# Patient Record
Sex: Male | Born: 1970 | Race: Black or African American | Hispanic: No | Smoking: Former smoker
Health system: Southern US, Community
[De-identification: ages and names within clinical notes are randomized; demographics above are authoritative.]

## PROBLEM LIST (undated history)

## (undated) DIAGNOSIS — Z95811 Presence of heart assist device: Secondary | ICD-10-CM

## (undated) DIAGNOSIS — J45909 Unspecified asthma, uncomplicated: Secondary | ICD-10-CM

## (undated) DIAGNOSIS — Z9989 Dependence on other enabling machines and devices: Principal | ICD-10-CM

## (undated) DIAGNOSIS — K219 Gastro-esophageal reflux disease without esophagitis: Secondary | ICD-10-CM

## (undated) DIAGNOSIS — G4733 Obstructive sleep apnea (adult) (pediatric): Principal | ICD-10-CM

## (undated) DIAGNOSIS — Z8719 Personal history of other diseases of the digestive system: Secondary | ICD-10-CM

## (undated) DIAGNOSIS — I509 Heart failure, unspecified: Secondary | ICD-10-CM

## (undated) HISTORY — DX: Dependence on other enabling machines and devices: Z99.89

## (undated) HISTORY — DX: Obstructive sleep apnea (adult) (pediatric): G47.33

---
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
## 2002-05-01 ENCOUNTER — Encounter: Payer: Self-pay | Admitting: *Deleted

## 2002-05-01 ENCOUNTER — Emergency Department (HOSPITAL_COMMUNITY): Admission: EM | Admit: 2002-05-01 | Discharge: 2002-05-01 | Payer: Self-pay | Admitting: *Deleted

## 2002-12-06 ENCOUNTER — Emergency Department (HOSPITAL_COMMUNITY): Admission: EM | Admit: 2002-12-06 | Discharge: 2002-12-06 | Payer: Self-pay | Admitting: Emergency Medicine

## 2002-12-06 ENCOUNTER — Encounter: Payer: Self-pay | Admitting: Emergency Medicine

## 2003-04-15 ENCOUNTER — Emergency Department (HOSPITAL_COMMUNITY): Admission: EM | Admit: 2003-04-15 | Discharge: 2003-04-15 | Payer: Self-pay | Admitting: Emergency Medicine

## 2004-06-12 ENCOUNTER — Emergency Department (HOSPITAL_COMMUNITY): Admission: EM | Admit: 2004-06-12 | Discharge: 2004-06-12 | Payer: Self-pay | Admitting: Emergency Medicine

## 2006-07-22 ENCOUNTER — Emergency Department (HOSPITAL_COMMUNITY): Admission: EM | Admit: 2006-07-22 | Discharge: 2006-07-22 | Payer: Self-pay | Admitting: Emergency Medicine

## 2006-08-22 ENCOUNTER — Emergency Department (HOSPITAL_COMMUNITY): Admission: EM | Admit: 2006-08-22 | Discharge: 2006-08-22 | Payer: Self-pay | Admitting: Emergency Medicine

## 2007-01-01 ENCOUNTER — Emergency Department (HOSPITAL_COMMUNITY): Admission: EM | Admit: 2007-01-01 | Discharge: 2007-01-01 | Payer: Self-pay | Admitting: Emergency Medicine

## 2007-11-08 ENCOUNTER — Emergency Department (HOSPITAL_COMMUNITY): Admission: EM | Admit: 2007-11-08 | Discharge: 2007-11-08 | Payer: Self-pay | Admitting: Emergency Medicine

## 2008-04-12 ENCOUNTER — Emergency Department (HOSPITAL_COMMUNITY): Admission: EM | Admit: 2008-04-12 | Discharge: 2008-04-12 | Payer: Self-pay | Admitting: Emergency Medicine

## 2011-02-26 LAB — CULTURE, ROUTINE-ABSCESS
Culture: NO GROWTH
Gram Stain: NONE SEEN

## 2016-04-30 ENCOUNTER — Emergency Department (HOSPITAL_COMMUNITY)
Admission: EM | Admit: 2016-04-30 | Discharge: 2016-05-01 | Disposition: A | Discharge status: Home / Self Care | Payer: Self-pay | Attending: Emergency Medicine | Admitting: Emergency Medicine

## 2016-04-30 DIAGNOSIS — B372 Candidiasis of skin and nail: Secondary | ICD-10-CM | POA: Insufficient documentation

## 2016-04-30 DIAGNOSIS — B3789 Other sites of candidiasis: Secondary | ICD-10-CM

## 2016-04-30 LAB — CBG MONITORING, ED: Glucose-Capillary: 95 mg/dL (ref 65–99)

## 2016-04-30 MED ORDER — NYSTATIN 100000 UNIT/GM EX CREA: TOPICAL_CREAM | CUTANEOUS | 0 refills | Status: DC

## 2016-04-30 NOTE — Discharge Instructions (Addendum)
Your blood sugar was normal today. I recommend you establish care with a primary care provider.

## 2016-04-30 NOTE — ED Triage Notes (Signed)
Pt reports moist rash in groin area x couple of months, states it is itching.

## 2016-04-30 NOTE — ED Provider Notes (Signed)
TIME SEEN: 11:40 PM  CHIEF COMPLAINT: Rash  HPI: Pt is a 45 y.o. male with a significant past medical history who presents to the emergency department with a pruritic rash in his inguinal. The past 2 months. States that he got tired of itching and findings are to be checked out for it. Does not have a primary care provider. Is not a diabetic. His anemia compromised in any way. No fevers, other rash. No systemic symptoms.  ROS: See HPI Constitutional: no fever  Eyes: no drainage  ENT: no runny nose   Cardiovascular:  no chest pain  Resp: no SOB  GI: no vomiting GU: no dysuria Integumentary: no rash  Allergy: no hives  Musculoskeletal: no leg swelling  Neurological: no slurred speech ROS otherwise negative  PAST MEDICAL HISTORY/PAST SURGICAL HISTORY:  No past medical history on file.  MEDICATIONS:  Prior to Admission medications   Medication Sig Start Date End Date Taking? Authorizing Provider  nystatin cream (MYCOSTATIN) Apply to affected area 2 times daily 04/30/16   Layla MawKristen N Copeland Neisen, DO    ALLERGIES:  Allergies not on file  SOCIAL HISTORY:  Social History  Substance Use Topics  . Smoking status: Not on file  . Smokeless tobacco: Not on file  . Alcohol use Not on file    FAMILY HISTORY: No family history on file.  EXAM: BP 119/77 (BP Location: Right Arm)   Pulse 82   Temp 98.2 F (36.8 C) (Oral)   Resp 16   Ht 6\' 2"  (1.88 m)   Wt 205 lb (93 kg)   SpO2 100%   BMI 26.32 kg/m  CONSTITUTIONAL: Alert and oriented and responds appropriately to questions. Well-appearing; well-nourished HEAD: Normocephalic EYES: Conjunctivae clear, PERRL, EOMI ENT: normal nose; no rhinorrhea; moist mucous membranes NECK: Supple, no meningismus, no nuchal rigidity, no LAD  CARD: RRR; S1 and S2 appreciated; no murmurs, no clicks, no rubs, no gallops RESP: Normal chest excursion without splinting or tachypnea; breath sounds clear and equal bilaterally; no wheezes, no rhonchi, no rales,  no hypoxia or respiratory distress, speaking full sentences ABD/GI: Normal bowel sounds; non-distended; soft, non-tender, no rebound, no guarding, no peritoneal signs, no hepatosplenomegaly GU:  Patient has a rash consistent with candidiasis in his bilateral inguinal folds - rash is raised, skin is darkened in this area, no significant erythema or warmth, no induration or fluctuance, no drainage, he does have satellite lesions, no petechiae or purpura, no blisters or desquamation BACK:  The back appears normal and is non-tender to palpation, there is no CVA tenderness EXT: Normal ROM in all joints; non-tender to palpation; no edema; normal capillary refill; no cyanosis, no calf tenderness or swelling    SKIN: Normal color for age and race; warm; no rash NEURO: Moves all extremities equally, sensation to light touch intact diffusely, cranial nerves II through XII intact, normal speech PSYCH: The patient's mood and manner are appropriate. Grooming and personal hygiene are appropriate.  MEDICAL DECISION MAKING: Patient here with rash consistent with candidiasis of his inguinal folds. We'll discharge with nystatin cream to place on this area twice daily. Blood glucose is 95. He does not have outpatient provider. Will provide him with this information. Discussed keeping this area clean and dry. No other signs of life-threatening rash on exam. Discussed return precautions.    At this time, I do not feel there is any life-threatening condition present. I have reviewed and discussed all results (EKG, imaging, lab, urine as appropriate) and exam findings with  patient/family. I have reviewed nursing notes and appropriate previous records.  I feel the patient is safe to be discharged home without further emergent workup and can continue workup as an outpatient as needed. Discussed usual and customary return precautions. Patient/family verbalize understanding and are comfortable with this plan.  Outpatient  follow-up has been provided. All questions have been answered.      Layla Maw Makaio Mach, DO 04/30/16 2356

## 2016-10-07 ENCOUNTER — Inpatient Hospital Stay (HOSPITAL_COMMUNITY)
Admission: AD | Admit: 2016-10-07 | Discharge: 2016-10-10 | DRG: 287 | Disposition: A | Discharge status: Home / Self Care | Payer: Medicaid Other | Source: Other Acute Inpatient Hospital | Attending: Cardiovascular Disease | Admitting: Cardiovascular Disease

## 2016-10-07 ENCOUNTER — Encounter (HOSPITAL_COMMUNITY): Payer: Self-pay | Admitting: General Practice

## 2016-10-07 DIAGNOSIS — I509 Heart failure, unspecified: Secondary | ICD-10-CM

## 2016-10-07 DIAGNOSIS — F17210 Nicotine dependence, cigarettes, uncomplicated: Secondary | ICD-10-CM | POA: Diagnosis present

## 2016-10-07 DIAGNOSIS — I5021 Acute systolic (congestive) heart failure: Secondary | ICD-10-CM | POA: Diagnosis present

## 2016-10-07 DIAGNOSIS — I420 Dilated cardiomyopathy: Secondary | ICD-10-CM | POA: Diagnosis present

## 2016-10-07 DIAGNOSIS — I472 Ventricular tachycardia: Secondary | ICD-10-CM | POA: Diagnosis present

## 2016-10-07 DIAGNOSIS — K219 Gastro-esophageal reflux disease without esophagitis: Secondary | ICD-10-CM | POA: Diagnosis present

## 2016-10-07 DIAGNOSIS — I340 Nonrheumatic mitral (valve) insufficiency: Secondary | ICD-10-CM | POA: Diagnosis present

## 2016-10-07 DIAGNOSIS — Z79899 Other long term (current) drug therapy: Secondary | ICD-10-CM

## 2016-10-07 DIAGNOSIS — Z23 Encounter for immunization: Secondary | ICD-10-CM | POA: Diagnosis not present

## 2016-10-07 DIAGNOSIS — I2729 Other secondary pulmonary hypertension: Secondary | ICD-10-CM | POA: Diagnosis present

## 2016-10-07 DIAGNOSIS — J449 Chronic obstructive pulmonary disease, unspecified: Secondary | ICD-10-CM | POA: Diagnosis present

## 2016-10-07 DIAGNOSIS — R011 Cardiac murmur, unspecified: Secondary | ICD-10-CM | POA: Diagnosis present

## 2016-10-07 DIAGNOSIS — I5020 Unspecified systolic (congestive) heart failure: Secondary | ICD-10-CM | POA: Diagnosis present

## 2016-10-07 HISTORY — DX: Gastro-esophageal reflux disease without esophagitis: K21.9

## 2016-10-07 HISTORY — DX: Personal history of other diseases of the digestive system: Z87.19

## 2016-10-07 HISTORY — DX: Unspecified asthma, uncomplicated: J45.909

## 2016-10-07 LAB — TROPONIN I
TROPONIN I: 0.03 ng/mL — AB (ref <0.03)
Troponin I: 0.03 ng/mL (ref <0.03)

## 2016-10-07 LAB — CBC
HCT: 43.2 % (ref 39.0–52.0)
Hemoglobin: 13.6 g/dL (ref 13.0–17.0)
MCH: 29.5 pg (ref 26.0–34.0)
MCHC: 31.5 g/dL (ref 30.0–36.0)
MCV: 93.7 fL (ref 78.0–100.0)
Platelets: 227 10*3/uL (ref 150–400)
RBC: 4.61 MIL/uL (ref 4.22–5.81)
RDW: 13.2 % (ref 11.5–15.5)
WBC: 12.7 10*3/uL — ABNORMAL HIGH (ref 4.0–10.5)

## 2016-10-07 LAB — CREATININE, SERUM
CREATININE: 1.31 mg/dL — AB (ref 0.61–1.24)
GFR calc Af Amer: >60 mL/min (ref >60)

## 2016-10-07 MED ORDER — ONDANSETRON HCL 4 MG/2ML IJ SOLN: 4.0000 mg | Freq: Four times a day (QID) | INTRAMUSCULAR | Status: DC | PRN

## 2016-10-07 MED ORDER — SODIUM CHLORIDE 0.9% FLUSH
3.0000 mL | Freq: Two times a day (BID) | INTRAVENOUS | Status: DC
  Administered 2016-10-07: 3 mL via INTRAVENOUS

## 2016-10-07 MED ORDER — PNEUMOCOCCAL VAC POLYVALENT 25 MCG/0.5ML IJ INJ
0.5000 mL | INJECTION | INTRAMUSCULAR | Status: AC
  Administered 2016-10-10: 0.5 mL via INTRAMUSCULAR
  Filled 2016-10-07: qty 0.5

## 2016-10-07 MED ORDER — CARVEDILOL 3.125 MG PO TABS
3.1250 mg | ORAL_TABLET | Freq: Two times a day (BID) | ORAL | Status: DC
  Administered 2016-10-07 – 2016-10-10 (×5): 3.125 mg via ORAL
  Filled 2016-10-07 (×7): qty 1

## 2016-10-07 MED ORDER — FUROSEMIDE 10 MG/ML IJ SOLN
40.0000 mg | Freq: Two times a day (BID) | INTRAMUSCULAR | Status: DC
  Administered 2016-10-07 – 2016-10-09 (×4): 40 mg via INTRAVENOUS
  Filled 2016-10-07 (×4): qty 4

## 2016-10-07 MED ORDER — HEPARIN SODIUM (PORCINE) 5000 UNIT/ML IJ SOLN
5000.0000 [IU] | Freq: Three times a day (TID) | INTRAMUSCULAR | Status: DC
  Administered 2016-10-07: 5000 [IU] via SUBCUTANEOUS
  Filled 2016-10-07: qty 1

## 2016-10-07 MED ORDER — LOSARTAN POTASSIUM 25 MG PO TABS
25.0000 mg | ORAL_TABLET | Freq: Every day | ORAL | Status: DC
  Administered 2016-10-07 – 2016-10-09 (×2): 25 mg via ORAL
  Filled 2016-10-07 (×3): qty 1

## 2016-10-07 MED ORDER — HEPARIN (PORCINE) IN NACL 100-0.45 UNIT/ML-% IJ SOLN
1250.0000 [IU]/h | INTRAMUSCULAR | Status: DC
  Administered 2016-10-07: 1250 [IU]/h via INTRAVENOUS
  Filled 2016-10-07: qty 250

## 2016-10-07 MED ORDER — CLONAZEPAM 0.5 MG PO TABS
1.0000 mg | ORAL_TABLET | Freq: Every day | ORAL | Status: DC
  Administered 2016-10-07 – 2016-10-09 (×3): 1 mg via ORAL
  Filled 2016-10-07 (×3): qty 2

## 2016-10-07 MED ORDER — SODIUM CHLORIDE 0.9% FLUSH: 3.0000 mL | INTRAVENOUS | Status: DC | PRN

## 2016-10-07 MED ORDER — HEPARIN BOLUS VIA INFUSION
4000.0000 [IU] | Freq: Once | INTRAVENOUS | Status: AC
  Administered 2016-10-07: 4000 [IU] via INTRAVENOUS
  Filled 2016-10-07: qty 4000

## 2016-10-07 MED ORDER — GUAIFENESIN 100 MG/5ML PO SOLN
10.0000 mL | ORAL | Status: DC | PRN
  Administered 2016-10-07 – 2016-10-09 (×3): 200 mg via ORAL
  Filled 2016-10-07: qty 50
  Filled 2016-10-07 (×2): qty 10

## 2016-10-07 MED ORDER — SODIUM CHLORIDE 0.9 % IV SOLN: 250.0000 mL | INTRAVENOUS | Status: DC | PRN

## 2016-10-07 MED ORDER — ACETAMINOPHEN 325 MG PO TABS: 650.0000 mg | ORAL_TABLET | ORAL | Status: DC | PRN

## 2016-10-07 NOTE — Progress Notes (Signed)
ANTICOAGULATION CONSULT NOTE  Pharmacy Consult for heparin Indication: chest pain/ACS  Heparin Dosing Weight: 94.5 kg   Assessment: 45 yom with +troponin. Pharmacy consulted to dose heparin for ACS. Not on anticoagulation PTA. CBC wnl. No bleed documented.   Received heparin SQ 5000 units x 1 at 1653.  Goal of Therapy:  Heparin level 0.3-0.7 units/ml Monitor platelets by anticoagulation protocol: Yes   Plan:  D/c SQ heparin Heparin 4000 unit bolus Start heparin at 1250 units/h 6h heparin level Daily heparin level/CBC Monitor s/sx bleeding   Babs Bertin, PharmD, BCPS Clinical Pharmacist 10/07/2016 9:00 PM

## 2016-10-07 NOTE — H&P (Signed)
Referring Physician: Langley Gauss, MD  Jonathan Wood is an 46 y.o. male.                       Chief Complaint: Shortness of breath on exertion  HPI: 46 year old male with PMH of bronchitis, Asthma, Tobacco use disorder, GERD had viral illness 1 month ago. Shortly after that he felt cough and shortness of breath on exertion. His echocardiogram at [facility] care was positive for dilated LV with reduced EF, mild LVH, diastolic dysfunction, moderate MR and pulmonary systolic hypertension.   Past Medical History:  Diagnosis Date  . Asthma   . GERD (gastroesophageal reflux disease)   . History of hiatal hernia       Past Surgical History:  Procedure Laterality Date  . Harrells    History reviewed. No pertinent family history. Social History:  reports that he has been smoking Cigarettes.  He has a 25.00 pack-year smoking history. He has never used smokeless tobacco. He reports that he drinks about 64 oz of Beer per week . He reports that he does not use drugs.  Allergies: No Known Allergies  Medications Prior to Admission  Medication Sig Dispense Refill  . nystatin cream (MYCOSTATIN) Apply to affected area 2 times daily 30 g 0    Results for orders placed or performed during the hospital encounter of 10/07/16 (from the past 48 hour(s))  CBC     Status: Abnormal   Collection Time: 10/07/16  6:55 PM  Result Value Ref Range   WBC 12.7 (H) 4.0 - 10.5 K/uL   RBC 4.61 4.22 - 5.81 MIL/uL   Hemoglobin 13.6 13.0 - 17.0 g/dL   HCT 43.2 39.0 - 52.0 %   MCV 93.7 78.0 - 100.0 fL   MCH 29.5 26.0 - 34.0 pg   MCHC 31.5 30.0 - 36.0 g/dL   RDW 13.2 11.5 - 15.5 %   Platelets 227 150 - 400 K/uL  Creatinine, serum     Status: Abnormal   Collection Time: 10/07/16  6:55 PM  Result Value Ref Range   Creatinine, Ser 1.31 (H) 0.61 - 1.24 mg/dL   GFR calc non Af Amer >60 >60 mL/min   GFR calc Af Amer >60 >60 mL/min    Comment: (NOTE) The eGFR has been  calculated using the CKD EPI equation. This calculation has not been validated in all clinical situations. eGFR's persistently <60 mL/min signify possible Chronic Kidney Disease.   Troponin I     Status: Abnormal   Collection Time: 10/07/16  6:55 PM  Result Value Ref Range   Troponin I 0.03 (HH) <0.03 ng/mL    Comment: CRITICAL RESULT CALLED TO, READ BACK BY AND VERIFIED WITH: J [facility] [id number] 10/07/2016 WBOND    No results found.  Review Of Systems Constitutional: No fever, chills, weight loss or gain. Eyes: No vision change, wears glasses. No discharge or pain. Ears: No hearing loss, No tinnitus. Respiratory: Positive asthma, COPD, pneumonias. Positive shortness of breath. No hemoptysis. Cardiovascular: No chest pain, palpitation, leg edema. Gastrointestinal: No nausea, vomiting, diarrhea, constipation. No GI bleed. No hepatitis. Genitourinary: No dysuria, hematuria, kidney stone. No incontinance. Neurological: No headache, stroke, seizures.  Psychiatry: No psych facility admission for anxiety, depression, suicide. No detox. Skin: No rash. Musculoskeletal: No joint pain, fibromyalgia. No neck pain, back pain. Lymphadenopathy: No lymphadenopathy. Hematology: No anemia or easy bruising.   Blood pressure (!) 86/65, pulse (!) 103, temperature  98.4 F (36.9 C), temperature source Oral, resp. rate 15, height '6\' 2"'$  (1.88 m), weight 94.5 kg (208 lb 4.8 oz), SpO2 97 %. Body mass index is 26.74 kg/m. General appearance: alert, cooperative, appears stated age and no distress Head: Normocephalic, atraumatic. Eyes: Brown eyes, pink conjunctiva, corneas clear. PERRL, EOM's intact. Neck: No adenopathy, no carotid bruit, no JVD, supple, symmetrical, trachea midline and thyroid not enlarged. Resp: Clearing to auscultation bilaterally. Cardio: Regular rate and rhythm, S1, S2 normal, II/VI systolic murmur, no click, rub or gallop GI: Soft, non-tender; bowel sounds normal; no  organomegaly. Extremities: Trace lower leg edema, no cyanosis or clubbing. Skin: Warm and dry.  Neurologic: Alert and oriented X 3, normal strength. Normal coordination and gait.  Assessment/Plan Acute left heart systolic failure COPD Dilated cardiomyopathy r/o CAD Pulmonary hypertension  R + L heart catheterization. Change lisinopril to Losartan.  Birdie Riddle, MD  10/07/2016, 9:10 PM

## 2016-10-08 ENCOUNTER — Encounter (HOSPITAL_COMMUNITY): Payer: Self-pay | Admitting: Cardiovascular Disease

## 2016-10-08 ENCOUNTER — Encounter (HOSPITAL_COMMUNITY)
Admission: AD | Disposition: A | Discharge status: Home / Self Care | Payer: Self-pay | Source: Other Acute Inpatient Hospital | Attending: Cardiovascular Disease

## 2016-10-08 ENCOUNTER — Inpatient Hospital Stay (HOSPITAL_COMMUNITY): Payer: Medicaid Other

## 2016-10-08 HISTORY — PX: RIGHT/LEFT HEART CATH AND CORONARY ANGIOGRAPHY: CATH118266

## 2016-10-08 LAB — POCT I-STAT 3, ART BLOOD GAS (G3+)
ACID-BASE EXCESS: 4 mmol/L — AB (ref 0.0–2.0)
BICARBONATE: 29.6 mmol/L — AB (ref 20.0–28.0)
O2 Saturation: 98 %
PH ART: 7.398 (ref 7.350–7.450)
PO2 ART: 99 mmHg (ref 83.0–108.0)
TCO2: 31 mmol/L (ref 0–100)
pCO2 arterial: 48 mmHg (ref 32.0–48.0)

## 2016-10-08 LAB — CBC WITH DIFFERENTIAL/PLATELET
BASOS ABS: 0 10*3/uL (ref 0.0–0.1)
Basophils Relative: 0 %
EOS ABS: 0.3 10*3/uL (ref 0.0–0.7)
EOS PCT: 2 %
HCT: 42.9 % (ref 39.0–52.0)
Hemoglobin: 13.7 g/dL (ref 13.0–17.0)
Lymphocytes Relative: 44 %
Lymphs Abs: 5.6 10*3/uL — ABNORMAL HIGH (ref 0.7–4.0)
MCH: 30 pg (ref 26.0–34.0)
MCHC: 31.9 g/dL (ref 30.0–36.0)
MCV: 94.1 fL (ref 78.0–100.0)
MONO ABS: 0.9 10*3/uL (ref 0.1–1.0)
Monocytes Relative: 7 %
Neutro Abs: 5.8 10*3/uL (ref 1.7–7.7)
Neutrophils Relative %: 47 %
PLATELETS: 229 10*3/uL (ref 150–400)
RBC: 4.56 MIL/uL (ref 4.22–5.81)
RDW: 13.2 % (ref 11.5–15.5)
WBC: 12.5 10*3/uL — AB (ref 4.0–10.5)

## 2016-10-08 LAB — COMPREHENSIVE METABOLIC PANEL
ALT: 48 U/L (ref 17–63)
AST: 31 U/L (ref 15–41)
Albumin: 3.2 g/dL — ABNORMAL LOW (ref 3.5–5.0)
Alkaline Phosphatase: 54 U/L (ref 38–126)
Anion gap: 9 (ref 5–15)
BUN: 21 mg/dL — AB (ref 6–20)
CHLORIDE: 102 mmol/L (ref 101–111)
CO2: 26 mmol/L (ref 22–32)
CREATININE: 1.11 mg/dL (ref 0.61–1.24)
Calcium: 8.8 mg/dL — ABNORMAL LOW (ref 8.9–10.3)
GFR calc non Af Amer: >60 mL/min (ref >60)
Glucose, Bld: 105 mg/dL — ABNORMAL HIGH (ref 65–99)
POTASSIUM: 4.6 mmol/L (ref 3.5–5.1)
SODIUM: 137 mmol/L (ref 135–145)
Total Bilirubin: 1 mg/dL (ref 0.3–1.2)
Total Protein: 5.7 g/dL — ABNORMAL LOW (ref 6.5–8.1)

## 2016-10-08 LAB — HEPARIN LEVEL (UNFRACTIONATED): HEPARIN UNFRACTIONATED: 0.4 [IU]/mL (ref 0.30–0.70)

## 2016-10-08 LAB — POCT I-STAT 3, VENOUS BLOOD GAS (G3P V)
Acid-Base Excess: 6 mmol/L — ABNORMAL HIGH (ref 0.0–2.0)
BICARBONATE: 32.6 mmol/L — AB (ref 20.0–28.0)
O2 SAT: 56 %
PCO2 VEN: 52.8 mmHg (ref 44.0–60.0)
PH VEN: 7.398 (ref 7.250–7.430)
TCO2: 34 mmol/L (ref 0–100)
pO2, Ven: 30 mmHg — CL (ref 32.0–45.0)

## 2016-10-08 LAB — PROTIME-INR
INR: 0.97
Prothrombin Time: 12.8 seconds (ref 11.4–15.2)

## 2016-10-08 LAB — BRAIN NATRIURETIC PEPTIDE: B NATRIURETIC PEPTIDE 5: 251.8 pg/mL — AB (ref 0.0–100.0)

## 2016-10-08 LAB — POCT ACTIVATED CLOTTING TIME: Activated Clotting Time: 103 seconds

## 2016-10-08 LAB — HIV ANTIBODY (ROUTINE TESTING W REFLEX): HIV SCREEN 4TH GENERATION: NONREACTIVE

## 2016-10-08 LAB — TROPONIN I: TROPONIN I: 0.04 ng/mL — AB (ref <0.03)

## 2016-10-08 SURGERY — RIGHT/LEFT HEART CATH AND CORONARY ANGIOGRAPHY
Anesthesia: LOCAL

## 2016-10-08 MED ORDER — SODIUM CHLORIDE 0.9% FLUSH
3.0000 mL | Freq: Two times a day (BID) | INTRAVENOUS | Status: DC
  Administered 2016-10-08 – 2016-10-09 (×4): 3 mL via INTRAVENOUS

## 2016-10-08 MED ORDER — IOPAMIDOL (ISOVUE-370) INJECTION 76%
INTRAVENOUS | Status: DC | PRN
  Administered 2016-10-08: 55 mL via INTRA_ARTERIAL

## 2016-10-08 MED ORDER — SODIUM CHLORIDE 0.9% FLUSH: 3.0000 mL | INTRAVENOUS | Status: DC | PRN

## 2016-10-08 MED ORDER — SODIUM CHLORIDE 0.9 % IV SOLN
INTRAVENOUS | Status: DC
  Administered 2016-10-08: 10 mL/h via INTRAVENOUS

## 2016-10-08 MED ORDER — FENTANYL CITRATE (PF) 100 MCG/2ML IJ SOLN
INTRAMUSCULAR | Status: AC
  Filled 2016-10-08: qty 2

## 2016-10-08 MED ORDER — FENTANYL CITRATE (PF) 100 MCG/2ML IJ SOLN
INTRAMUSCULAR | Status: DC | PRN
  Administered 2016-10-08 (×3): 25 ug via INTRAVENOUS

## 2016-10-08 MED ORDER — ALPRAZOLAM 0.25 MG PO TABS
0.2500 mg | ORAL_TABLET | Freq: Three times a day (TID) | ORAL | Status: DC | PRN
Start: 2016-10-08 — End: 2016-10-10
  Administered 2016-10-08: 0.25 mg via ORAL
  Filled 2016-10-08: qty 1

## 2016-10-08 MED ORDER — HEPARIN (PORCINE) IN NACL 2-0.9 UNIT/ML-% IJ SOLN
INTRAMUSCULAR | Status: AC | PRN
  Administered 2016-10-08: 1000 mL via INTRA_ARTERIAL

## 2016-10-08 MED ORDER — HEPARIN (PORCINE) IN NACL 2-0.9 UNIT/ML-% IJ SOLN
INTRAMUSCULAR | Status: AC
  Filled 2016-10-08: qty 500

## 2016-10-08 MED ORDER — MIDAZOLAM HCL 2 MG/2ML IJ SOLN
INTRAMUSCULAR | Status: AC
  Filled 2016-10-08: qty 2

## 2016-10-08 MED ORDER — SODIUM CHLORIDE 0.9 % IV SOLN: 250.0000 mL | INTRAVENOUS | Status: DC | PRN

## 2016-10-08 MED ORDER — IOPAMIDOL (ISOVUE-370) INJECTION 76%
INTRAVENOUS | Status: AC
  Filled 2016-10-08: qty 100

## 2016-10-08 MED ORDER — LIVING BETTER WITH HEART FAILURE BOOK
Freq: Once | Status: AC
  Administered 2016-10-08: 17:00:00

## 2016-10-08 MED ORDER — LIDOCAINE HCL (PF) 1 % IJ SOLN
INTRAMUSCULAR | Status: DC | PRN
  Administered 2016-10-08: 18 mL via INTRADERMAL

## 2016-10-08 MED ORDER — SODIUM CHLORIDE 0.9% FLUSH: 3.0000 mL | Freq: Two times a day (BID) | INTRAVENOUS | Status: DC

## 2016-10-08 MED ORDER — LIDOCAINE HCL (PF) 1 % IJ SOLN
INTRAMUSCULAR | Status: AC
Start: 2016-10-08 — End: 2016-10-08
  Filled 2016-10-08: qty 30

## 2016-10-08 MED ORDER — MIDAZOLAM HCL 2 MG/2ML IJ SOLN
INTRAMUSCULAR | Status: DC | PRN
  Administered 2016-10-08: 1 mg via INTRAVENOUS

## 2016-10-08 SURGICAL SUPPLY — 11 items
CATH INFINITI 5FR MULTPACK ANG (CATHETERS) ×1 IMPLANT
CATH SWAN GANZ 7F STRAIGHT (CATHETERS) ×1 IMPLANT
GUIDEWIRE 3MM J TIP .035 145 (WIRE) ×1 IMPLANT
KIT HEART LEFT (KITS) ×2 IMPLANT
NDL SMART REG 18GX2-3/4 (NEEDLE) IMPLANT
NEEDLE SMART REG 18GX2-3/4 (NEEDLE) ×2 IMPLANT
PACK CARDIAC CATHETERIZATION (CUSTOM PROCEDURE TRAY) ×2 IMPLANT
SHEATH PINNACLE 5F 10CM (SHEATH) ×1 IMPLANT
SHEATH PINNACLE 7F 10CM (SHEATH) ×1 IMPLANT
TRANSDUCER W/STOPCOCK (MISCELLANEOUS) ×3 IMPLANT
WIRE EMERALD 3MM-J .025X260CM (WIRE) ×1 IMPLANT

## 2016-10-08 NOTE — Care Management Note (Addendum)
Case Management Note  Patient Details  Name: Jonathan Wood MRN: [id number] Date of Birth: [birth year]  Subjective/Objective:  Pt presented for Chest Pain-Post cardiac cath. Pt is from home with wife. 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 CM did reach out to [employer]. Pt is without PCP- pt is agreeable to be seen at the [facility].                    Action/Plan: CM did schedule patient an appointment at the [facility] Department. Appointment placed on the AVS. Pt states he is able to afford medications from the [facility] in [place]. No further needs from CM at this time.   Expected Discharge Date:                  Expected Discharge Plan:  Home/Self Care  In-House Referral: Financial Counselor   Discharge planning Services  CM Consult, Follow-up appt scheduled, Indigent [facility], Medication Assistance  Post Acute Care Choice:  NA Choice offered to:  NA  DME Arranged:  N/A DME Agency:  NA  HH Arranged:  NA HH Agency:  NA  Status of Service:  Completed, signed off  If discussed at Long Length of Stay Meetings, dates discussed:    Additional Comments: 1616 10-08-16 30 day free Entresto card provided to patient. [facility] Jerene Bears will assist with Entresto at the appointment. CM did call [facility] in [place] has 24/26 mg tablets and 49/51 mg tablets available. No further needs from CM @ this time.  Gala Lewandowsky, RN 10/08/2016, 11:20 AM

## 2016-10-08 NOTE — Interval H&P Note (Signed)
History and Physical Interval Note:  10/08/2016 7:45 AM  Jonathan Wood  has presented today for surgery, with the diagnosis of cp  The various methods of treatment have been discussed with the patient and family. After consideration of risks, benefits and other options for treatment, the patient has consented to  Procedure(s): Right/Left Heart Cath and Coronary Angiography (N/A) as a surgical intervention .  The patient's history has been reviewed, patient examined, no change in status, stable for surgery.  I have reviewed the patient's chart and labs.  Questions were answered to the patient's satisfaction.     Jonathan Wood S

## 2016-10-08 NOTE — Progress Notes (Signed)
ANTICOAGULATION CONSULT NOTE  Pharmacy Consult for heparin Indication: chest pain/ACS  Heparin Dosing Weight: 94.5 kg   Assessment: 45 yom with +troponin. Pharmacy consulted to dose heparin for ACS. Initial heparin level is therapeutic at 0.4. CBC stable.   Goal of Therapy:  Heparin level 0.3-0.7 units/ml Monitor platelets by anticoagulation protocol: Yes   Plan:  1. Continue heparin infusion at 1250 units/h 2. Confirmation heparin level in 6 hours  3. Daily heparin level/CBC 4. Monitor s/sx bleeding 5. Follow up cardiology plans   Pollyann Samples, PharmD, BCPS 10/08/2016, 4:18 AM

## 2016-10-08 NOTE — Progress Notes (Addendum)
Site area: RFA/RFV Site Prior to Removal:  Level 0 Pressure Applied For:30 min Manual:   manual yes Patient Status During Pull:  stable Post Pull Site:  Level Post Pull Instructions Given:  yes Post Pull Pulses Present: palpable Rt PT Dressing Applied:  tegaderm Bedrest begins @ 0950 till 1350 Comments:

## 2016-10-09 LAB — BASIC METABOLIC PANEL
Anion gap: 8 (ref 5–15)
BUN: 20 mg/dL (ref 6–20)
CHLORIDE: 101 mmol/L (ref 101–111)
CO2: 27 mmol/L (ref 22–32)
Calcium: 8.8 mg/dL — ABNORMAL LOW (ref 8.9–10.3)
Creatinine, Ser: 1.08 mg/dL (ref 0.61–1.24)
GFR calc Af Amer: >60 mL/min (ref >60)
GFR calc non Af Amer: >60 mL/min (ref >60)
GLUCOSE: 129 mg/dL — AB (ref 65–99)
Potassium: 4 mmol/L (ref 3.5–5.1)
Sodium: 136 mmol/L (ref 135–145)

## 2016-10-09 LAB — CBC
HEMATOCRIT: 42.7 % (ref 39.0–52.0)
Hemoglobin: 13.6 g/dL (ref 13.0–17.0)
MCH: 29.5 pg (ref 26.0–34.0)
MCHC: 31.9 g/dL (ref 30.0–36.0)
MCV: 92.6 fL (ref 78.0–100.0)
Platelets: 204 10*3/uL (ref 150–400)
RBC: 4.61 MIL/uL (ref 4.22–5.81)
RDW: 12.9 % (ref 11.5–15.5)
WBC: 8.4 10*3/uL (ref 4.0–10.5)

## 2016-10-09 MED ORDER — SACUBITRIL-VALSARTAN 24-26 MG PO TABS: 1.0000 | ORAL_TABLET | Freq: Two times a day (BID) | ORAL | Status: DC

## 2016-10-09 MED ORDER — MAGNESIUM SULFATE IN D5W 1-5 GM/100ML-% IV SOLN
1.0000 g | Freq: Once | INTRAVENOUS | Status: AC
  Administered 2016-10-09: 1 g via INTRAVENOUS
  Filled 2016-10-09: qty 100

## 2016-10-09 MED ORDER — FUROSEMIDE 40 MG PO TABS
40.0000 mg | ORAL_TABLET | Freq: Two times a day (BID) | ORAL | Status: DC
  Administered 2016-10-09 – 2016-10-10 (×2): 40 mg via ORAL
  Filled 2016-10-09 (×2): qty 1

## 2016-10-09 NOTE — Progress Notes (Signed)
Notified by CCMD that patient had 18 beats V-Tach at 19:18 pm. Dr. Algie Coffer made aware. Order received for Mag IV x 1, and to check magnesium in AM. Will continue to monitor patient.

## 2016-10-09 NOTE — Progress Notes (Signed)
Ref: Patient, No Pcp Per   Subjective:  Feeling better. VS stable. Normal coronaries with moderate pulmonary hypertension on cardiac catheterization yesterday.  Objective:  Vital Signs in the last 24 hours: Temp:  [98 F (36.7 C)-98.2 F (36.8 C)] 98 F (36.7 C) (05/25 2145) Pulse Rate:  [92-108] 97 (05/26 1026) Cardiac Rhythm: Normal sinus rhythm;Sinus tachycardia (05/26 1026) Resp:  [16-19] 16 (05/25 2145) BP: (107)/(79) 107/79 (05/25 2145) SpO2:  [96 %-97 %] 96 % (05/26 1026) Weight:  [92.8 kg (204 lb 8 oz)] 92.8 kg (204 lb 8 oz) (05/26 0303)  Physical Exam: BP Readings from Last 1 Encounters:  10/08/16 107/79    Wt Readings from Last 1 Encounters:  10/09/16 92.8 kg (204 lb 8 oz)    Weight change: -1.724 kg (-3 lb 12.8 oz) Body mass index is 26.26 kg/m. HEENT: [place]/AT, Eyes-Brown, PERL, EOMI, Conjunctiva-Pink, Sclera-Non-icteric Neck: No JVD, No bruit, Trachea midline. Lungs:  Clear, Bilateral. Cardiac:  Regular rhythm, normal S1 and S2, no S3. II/VI systolic murmur. Abdomen:  Soft, non-tender. BS present. Extremities:  No edema present. No cyanosis. No clubbing. CNS: AxOx3, Cranial nerves grossly intact, moves all 4 extremities.  Skin: Warm and dry.   Intake/Output from previous day: 05/25 0701 - 05/26 0700 In: 625 [P.O.:600; I.V.:25] Out: 1175 [Urine:1175]    Lab Results: BMET    Component Value Date/Time   NA 136 10/09/2016 0241   NA 137 10/08/2016 0332   K 4.0 10/09/2016 0241   K 4.6 10/08/2016 0332   CL 101 10/09/2016 0241   CL 102 10/08/2016 0332   CO2 27 10/09/2016 0241   CO2 26 10/08/2016 0332   GLUCOSE 129 (H) 10/09/2016 0241   GLUCOSE 105 (H) 10/08/2016 0332   BUN 20 10/09/2016 0241   BUN 21 (H) 10/08/2016 0332   CREATININE 1.08 10/09/2016 0241   CREATININE 1.11 10/08/2016 0332   CREATININE 1.31 (H) 10/07/2016 1855   CALCIUM 8.8 (L) 10/09/2016 0241   CALCIUM 8.8 (L) 10/08/2016 0332   GFRNONAA >60 10/09/2016 0241   GFRNONAA >60 10/08/2016  0332   GFRNONAA >60 10/07/2016 1855   GFRAA >60 10/09/2016 0241   GFRAA >60 10/08/2016 0332   GFRAA >60 10/07/2016 1855   CBC    Component Value Date/Time   WBC 8.4 10/09/2016 0241   RBC 4.61 10/09/2016 0241   HGB 13.6 10/09/2016 0241   HCT 42.7 10/09/2016 0241   PLT 204 10/09/2016 0241   MCV 92.6 10/09/2016 0241   MCH 29.5 10/09/2016 0241   MCHC 31.9 10/09/2016 0241   RDW 12.9 10/09/2016 0241   LYMPHSABS 5.6 (H) 10/08/2016 0332   MONOABS 0.9 10/08/2016 0332   EOSABS 0.3 10/08/2016 0332   BASOSABS 0.0 10/08/2016 0332   HEPATIC Function Panel  Recent Labs  10/08/16 0332  PROT 5.7*   HEMOGLOBIN A1C No components found for: HGA1C,  MPG CARDIAC ENZYMES Lab Results  Component Value Date   TROPONINI 0.04 (HH) 10/08/2016   TROPONINI 0.03 (HH) 10/07/2016   TROPONINI 0.03 (HH) 10/07/2016   BNP No results for input(s): PROBNP in the last 8760 hours. TSH No results for input(s): TSH in the last 8760 hours. CHOLESTEROL No results for input(s): CHOL in the last 8760 hours.  Scheduled Meds: . carvedilol  3.125 mg Oral BID WC  . clonazePAM  1 mg Oral QHS  . furosemide  40 mg Oral BID  . pneumococcal 23 valent vaccine  0.5 mL Intramuscular Tomorrow-1000  . sacubitril-valsartan  1 tablet Oral  BID  . sodium chloride flush  3 mL Intravenous Q12H   Continuous Infusions: . 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 Change Lasix to PO. Increase activity.   LOS: 2 days    Orpah Cobb  MD  10/09/2016, 11:12 AM

## 2016-10-10 LAB — BASIC METABOLIC PANEL
Anion gap: 7 (ref 5–15)
BUN: 17 mg/dL (ref 6–20)
CHLORIDE: 103 mmol/L (ref 101–111)
CO2: 25 mmol/L (ref 22–32)
Calcium: 8.9 mg/dL (ref 8.9–10.3)
Creatinine, Ser: 1.02 mg/dL (ref 0.61–1.24)
GFR calc Af Amer: >60 mL/min (ref >60)
GFR calc non Af Amer: >60 mL/min (ref >60)
GLUCOSE: 100 mg/dL — AB (ref 65–99)
POTASSIUM: 4.2 mmol/L (ref 3.5–5.1)
Sodium: 135 mmol/L (ref 135–145)

## 2016-10-10 LAB — CBC
HEMATOCRIT: 45.6 % (ref 39.0–52.0)
Hemoglobin: 14.6 g/dL (ref 13.0–17.0)
MCH: 29.3 pg (ref 26.0–34.0)
MCHC: 32 g/dL (ref 30.0–36.0)
MCV: 91.4 fL (ref 78.0–100.0)
Platelets: 210 10*3/uL (ref 150–400)
RBC: 4.99 MIL/uL (ref 4.22–5.81)
RDW: 12.6 % (ref 11.5–15.5)
WBC: 8.6 10*3/uL (ref 4.0–10.5)

## 2016-10-10 LAB — MAGNESIUM: Magnesium: 2.6 mg/dL — ABNORMAL HIGH (ref 1.7–2.4)

## 2016-10-10 MED ORDER — FUROSEMIDE 40 MG PO TABS: 40.0000 mg | ORAL_TABLET | Freq: Every day | ORAL | 3 refills | Status: DC

## 2016-10-10 MED ORDER — CARVEDILOL 6.25 MG PO TABS: 6.2500 mg | ORAL_TABLET | Freq: Two times a day (BID) | ORAL | 3 refills | Status: DC

## 2016-10-10 MED ORDER — SACUBITRIL-VALSARTAN 24-26 MG PO TABS: 1.0000 | ORAL_TABLET | Freq: Two times a day (BID) | ORAL | 3 refills | Status: DC

## 2016-10-10 NOTE — Discharge Summary (Signed)
Physician Discharge Summary  Patient ID: Jonathan Wood MRN: [id number] DOB/AGE: 07-10-1970 46 y.o.  Admit date: 10/07/2016 Discharge date: 10/10/2016  Admission Diagnoses: Acute left heart systolic failure COPD Dilated cardiomyopathy Pulmonary hypertension, secondary  Discharge Diagnoses:  Principal Problem:   Acute left systolic heart failure (HCC) Active Problems:   Dilated and non ischemic cardiomyopathy   COPD   Secondary pulmonary hypertension   Non-sustained VT  Discharged Condition: fair  Hospital Course: 46 year old male with PMH of asthma, bronchitis, tobacco use disorder and GERD had viral illness 1 month ago followed by cough and shortness of breath progressively worsening. [facility]ogram at [facility] was positive for dilated cardiomyopathy with reduced EF of 25-30 %. His R + L heart catheterization was positive for non-ischemic dilated cardiomyopathy with mild to moderate pulmonary hypertension.  He responded well to IV followed by PO lasix and was able to walk around without shortness of breath. He was also placed on Entresto 24-26 mg. Twice daily. He will see me in 1 week and his primary care doctor in 1-2 weeks.  Consults: cardiology  Significant Diagnostic Studies: labs: Near normal CBC except mildly elevated WBC count on admission. Near normal BMET with borderline sugar level. Magnesium level was 2.6 and BNP was elevated at 251.8  EKG: Sinus tachycardia with LVH.  Chest x-ray: Stable cardiomegaly with mild peribronchial thickening.  Right and Left heart catheterization showed normal coronaries with elevated LVEDP and right heart pressures.   Treatments: cardiac meds: carvedilol, furosemide and Entresto.  Discharge Exam: Blood pressure 101/72, pulse (!) 102, temperature 98 F (36.7 C), temperature source Oral, resp. rate 17, height 6\' 2"  (1.88 m), weight 91 kg (200 lb 9.6 oz), SpO2 99 %. General appearance: alert, cooperative and appears  stated age. Head: Normocephalic, atraumatic. Eyes: Brown eyes, pink conjunctiva, corneas clear. PERRL, EOM's intact.  Neck: No adenopathy, no carotid bruit, no JVD, supple, symmetrical, trachea midline and thyroid not enlarged. Resp: Clear to auscultation bilaterally. Cardio: Regular rate and rhythm, S1, S2 normal, II/VI systolic murmur, no click, rub or gallop. GI: Soft, non-tender; bowel sounds normal; no organomegaly. Extremities: No edema, cyanosis or clubbing. Skin: Warm and dry.  Neurologic: Alert and oriented X 3, normal strength and tone. Normal coordination and gait.  Disposition: 01-Home or Self Care   Allergies as of 10/10/2016   No Known Allergies     Medication List    STOP taking these medications   nystatin cream Commonly known as:  MYCOSTATIN     TAKE these medications   albuterol 108 (90 Base) MCG/ACT inhaler Commonly known as:  PROVENTIL HFA;VENTOLIN HFA Inhale into the lungs every 6 (six) hours as needed for wheezing or shortness of breath.   carvedilol 6.25 MG tablet Commonly known as:  COREG Take 1 tablet (6.25 mg total) by mouth 2 (two) times daily with a meal.   furosemide 40 MG tablet Commonly known as:  LASIX Take 1 tablet (40 mg total) by mouth daily.   sacubitril-valsartan 24-26 MG Commonly known as:  ENTRESTO Take 1 tablet by mouth 2 (two) times daily.      Follow-up Information    [facility] Follow up on 10/21/2016.   Why:  @ 9:00 am for Hospital Follow Up with [facility]. Arrive 15 minutes earlier for appointment. Please bring ID, proof of household income and d/c summary with medication list. Please ask for assistance with Entresto Medication at Appointment.  Contact information: [address] [place]  [place] [address] [phone]        Orpah Cobb, MD. Schedule an appointment as soon as possible for a visit in 1 week(s).   Specialty:  Cardiology Contact information: [address] [place]  [address] [phone]           Signed: Ricki Rodriguez 10/10/2016, 9:55 AM

## 2016-12-07 ENCOUNTER — Emergency Department (HOSPITAL_COMMUNITY): Payer: Self-pay

## 2016-12-07 ENCOUNTER — Encounter (HOSPITAL_COMMUNITY): Payer: Self-pay

## 2016-12-07 ENCOUNTER — Emergency Department (HOSPITAL_COMMUNITY)
Admission: EM | Admit: 2016-12-07 | Discharge: 2016-12-07 | Disposition: A | Discharge status: Home / Self Care | Payer: Self-pay | Attending: Emergency Medicine | Admitting: Emergency Medicine

## 2016-12-07 DIAGNOSIS — I509 Heart failure, unspecified: Secondary | ICD-10-CM | POA: Insufficient documentation

## 2016-12-07 DIAGNOSIS — R05 Cough: Secondary | ICD-10-CM | POA: Insufficient documentation

## 2016-12-07 DIAGNOSIS — Z79899 Other long term (current) drug therapy: Secondary | ICD-10-CM | POA: Insufficient documentation

## 2016-12-07 DIAGNOSIS — J45909 Unspecified asthma, uncomplicated: Secondary | ICD-10-CM | POA: Insufficient documentation

## 2016-12-07 DIAGNOSIS — F17210 Nicotine dependence, cigarettes, uncomplicated: Secondary | ICD-10-CM | POA: Insufficient documentation

## 2016-12-07 DIAGNOSIS — R0602 Shortness of breath: Secondary | ICD-10-CM | POA: Insufficient documentation

## 2016-12-07 HISTORY — DX: Heart failure, unspecified: I50.9

## 2016-12-07 LAB — CBC
HCT: 42.2 % (ref 39.0–52.0)
HEMOGLOBIN: 13.9 g/dL (ref 13.0–17.0)
MCH: 30 pg (ref 26.0–34.0)
MCHC: 32.9 g/dL (ref 30.0–36.0)
MCV: 90.9 fL (ref 78.0–100.0)
Platelets: 215 10*3/uL (ref 150–400)
RBC: 4.64 MIL/uL (ref 4.22–5.81)
RDW: 13.3 % (ref 11.5–15.5)
WBC: 6.3 10*3/uL (ref 4.0–10.5)

## 2016-12-07 LAB — BASIC METABOLIC PANEL
ANION GAP: 8 (ref 5–15)
BUN: 14 mg/dL (ref 6–20)
CALCIUM: 8.8 mg/dL — AB (ref 8.9–10.3)
CHLORIDE: 113 mmol/L — AB (ref 101–111)
CO2: 21 mmol/L — ABNORMAL LOW (ref 22–32)
Creatinine, Ser: 1.11 mg/dL (ref 0.61–1.24)
GFR calc non Af Amer: >60 mL/min (ref >60)
Glucose, Bld: 98 mg/dL (ref 65–99)
POTASSIUM: 3.9 mmol/L (ref 3.5–5.1)
Sodium: 142 mmol/L (ref 135–145)

## 2016-12-07 LAB — TROPONIN I
TROPONIN I: 0.03 ng/mL — AB (ref <0.03)
Troponin I: <0.03 ng/mL (ref <0.03)

## 2016-12-07 LAB — BRAIN NATRIURETIC PEPTIDE: B Natriuretic Peptide: 1321 pg/mL — ABNORMAL HIGH (ref 0.0–100.0)

## 2016-12-07 MED ORDER — ALBUTEROL SULFATE (2.5 MG/3ML) 0.083% IN NEBU
5.0000 mg | INHALATION_SOLUTION | Freq: Once | RESPIRATORY_TRACT | Status: AC
  Administered 2016-12-07: 5 mg via RESPIRATORY_TRACT
  Filled 2016-12-07: qty 6

## 2016-12-07 MED ORDER — FUROSEMIDE 10 MG/ML IJ SOLN
40.0000 mg | Freq: Once | INTRAMUSCULAR | Status: AC
  Administered 2016-12-07: 40 mg via INTRAVENOUS
  Filled 2016-12-07: qty 4

## 2016-12-07 NOTE — ED Provider Notes (Signed)
[facility]Y DEPT Provider Note   CSN: [id number] Arrival date & time: 12/07/16  1625     History   Chief Complaint Chief Complaint  Patient presents with  . Congestive Heart Failure    HPI Jonathan Wood is a 46 y.o. male.  HPI 46 year old man history of cardiomyopathy, CHF, and asthma presents today stating he has had some increasing dyspnea. Is worse on exertion. He has not noted any swelling. He denies any chest pain. Some nonproductive cough. He has not noted any fever or chills. He states he is taking his CHF medications as prescribed but has continued to smoke and drink. He states he smokes 4 cigarettes a day and 2 beers a day. He was scheduled DR. [place] but has been unable to make his appointments. Past Medical History:  Diagnosis Date  . Asthma   . Cardiomegaly   . CHF (congestive heart failure) (HCC)   . GERD (gastroesophageal reflux disease)   . History of hiatal hernia     Patient Active Problem List   Diagnosis Date Noted  . 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 Acute left systolic heart failure (HCC) 10/07/2016    Past Surgical History:  Procedure Laterality Date  . NASAL FRACTURE SURGERY  1987  . RIGHT/LEFT HEART CATH AND CORONARY ANGIOGRAPHY N/A 10/08/2016   Procedure: Right/Left Heart Cath and Coronary Angiography;  Surgeon: Orpah Cobb, MD;  Location: [facility] INVASIVE CV LAB;  Service: Cardiovascular;  Laterality: N/A;       Home Medications    Prior to Admission medications   Medication Sig Start Date End Date Taking? Authorizing Provider  albuterol (PROVENTIL HFA;VENTOLIN HFA) 108 (90 Base) MCG/ACT inhaler Inhale into the lungs every 6 (six) hours as needed for wheezing or shortness of breath.   Yes [provider]  carvedilol (COREG) 6.25 MG tablet Take 1 tablet (6.25 mg total) by mouth 2 (two) times daily with a meal. 10/10/16  Yes Orpah Cobb, MD  furosemide (LASIX) 40 MG tablet Take 1 tablet (40 mg total) by  mouth daily. 10/10/16  Yes Orpah Cobb, MD  sacubitril-valsartan (ENTRESTO) 24-26 MG Take 1 tablet by mouth 2 (two) times daily. 10/10/16  Yes Orpah Cobb, MD    Family History No family history on file.  Social History Social History  Substance Use Topics  . Smoking status: Current Every Day Smoker    Packs/day: 1.00    Years: 25.00    Types: Cigarettes  . Smokeless tobacco: Never Used  . Alcohol use 3.0 oz/week    5 Cans of beer per week     Allergies   Patient has no known allergies.   Review of Systems Review of Systems  All other systems reviewed and are negative.    Physical Exam Updated Vital Signs BP (!) 121/97   Pulse (!) 104   Temp 98.2 F (36.8 C) (Oral)   Resp (!) 26   Ht 1.88 m (6\' 2" )   Wt 95.7 kg (211 lb)   SpO2 98%   BMI 27.09 kg/m   Physical Exam  Constitutional: He is oriented to person, place, and time. He appears well-developed and well-nourished.  HENT:  Head: Normocephalic and atraumatic.  Right Ear: External ear normal.  Left Ear: External ear normal.  Eyes: Pupils are equal, round, and reactive to light. Conjunctivae and EOM are normal.  Neck: Normal range of motion. Neck supple.  Cardiovascular: Normal rate and regular rhythm.   Pulmonary/Chest: Effort normal and breath sounds normal.  Abdominal: Soft. Bowel sounds are normal.  Musculoskeletal: Normal range of motion. He exhibits no edema.  Neurological: He is alert and oriented to person, place, and time.  Skin: Skin is warm and dry. Capillary refill takes less than 2 seconds.  Psychiatric: He has a normal mood and affect.  Nursing note and vitals reviewed.    ED Treatments / Results  Labs (all labs ordered are listed, but only abnormal results are displayed) Labs Reviewed  BASIC METABOLIC PANEL - Abnormal; Notable for the following:       Result Value   Chloride 113 (*)    CO2 21 (*)    Calcium 8.8 (*)    All other components within normal limits  TROPONIN I -  Abnormal; Notable for the following:    Troponin I 0.03 (*)    All other components within normal limits  BRAIN NATRIURETIC PEPTIDE - Abnormal; Notable for the following:    B Natriuretic Peptide 1,321.0 (*)    All other components within normal limits  CBC  TROPONIN I    EKG  EKG Interpretation  Date/Time:  Tuesday December 07 2016 16:46:28 EDT Ventricular Rate:  97 PR Interval:    QRS Duration: 78 QT Interval:  391 QTC Calculation: 497 R Axis:   77 Text Interpretation:  Normal sinus rhythm Left ventricular hypertrophy No significant change since last tracing Confirmed by Margarita Grizzle [phone]) on 12/07/2016 5:01:03 PM       Radiology Dg Chest 2 View  Result Date: 12/07/2016 CLINICAL DATA:  Shortness of breath. EXAM: CHEST  2 VIEW COMPARISON:  11/09/2016 FINDINGS: Mediastinum and hilar structures are normal. Cardiomegaly with normal pulmonary vascularity. No focal infiltrate. No pleural effusion or pneumothorax. No acute bony abnormality identified. IMPRESSION: 1.  Cardiomegaly.  No evidence of overt congestive heart failure. 2. No focal infiltrate . Electronically Signed   By: Maisie Fus  Register   On: 12/07/2016 17:31    Procedures Procedures (including critical care time)  Medications Ordered in ED Medications  furosemide (LASIX) injection 40 mg (40 mg Intravenous Given 12/07/16 1732)  albuterol (PROVENTIL) (2.5 MG/3ML) 0.083% nebulizer solution 5 mg (5 mg Nebulization Given 12/07/16 1743)     Initial Impression / Assessment and Plan / ED Course  I have reviewed the triage vital signs and the nursing notes.  Pertinent labs & imaging results that were available during my care of the patient were reviewed by me and considered in my medical decision making (see chart for details).     This is a 46 year old male with congestive heart failure and asthma who presents with some increased cough and dyspnea. He is hemodynamically stable here. No evidence of ischemia on EKG. Troponin  and repeat troponin without upper trend. His initial troponin was slightly elevated at 0.03. Repeat is less than 0.03. BNP is elevated at 1321. He received IV Lasix here. He also received albuterol here. No complaints of pain were noted. Low index of suspicion for acute coronary syndrome or MI. Discussed precautions need for close follow-up and voices understanding.  Final Clinical Impressions(s) / ED Diagnoses   Final diagnoses:  Shortness of breath  Chronic congestive heart failure, unspecified heart failure type [facility])    New Prescriptions New Prescriptions   No medications on file     Margarita Grizzle, MD 12/07/16 2028

## 2016-12-07 NOTE — ED Notes (Signed)
Patient transported to X-ray 

## 2016-12-07 NOTE — ED Notes (Signed)
Patient states that he has been drinking a lot because he and his wife recently split up.

## 2016-12-07 NOTE — ED Triage Notes (Signed)
Patient states that he is having shortness of breath.  Diagnosed with cardiomegaly and chf 3 months ago.  Was in [place] a [place] and had a cath and that is when I found out my heart was enlarged.  I have been sleeping on 4 pillows for the past week.  My meds do not seem like they are working even though my weight has not changed.

## 2016-12-07 NOTE — Discharge Instructions (Signed)
Continue your medications. Return if you are worse at any time. Follow up with Dr. Algie Coffer asap

## 2017-01-28 ENCOUNTER — Encounter (HOSPITAL_COMMUNITY): Payer: Self-pay

## 2017-01-28 ENCOUNTER — Emergency Department (HOSPITAL_COMMUNITY)
Admission: EM | Admit: 2017-01-28 | Discharge: 2017-01-28 | Disposition: A | Discharge status: Home / Self Care | Payer: Self-pay | Attending: Emergency Medicine | Admitting: Emergency Medicine

## 2017-01-28 ENCOUNTER — Emergency Department (HOSPITAL_COMMUNITY): Payer: Self-pay

## 2017-01-28 DIAGNOSIS — Z87891 Personal history of nicotine dependence: Secondary | ICD-10-CM | POA: Insufficient documentation

## 2017-01-28 DIAGNOSIS — J45909 Unspecified asthma, uncomplicated: Secondary | ICD-10-CM | POA: Insufficient documentation

## 2017-01-28 DIAGNOSIS — Z79899 Other long term (current) drug therapy: Secondary | ICD-10-CM | POA: Insufficient documentation

## 2017-01-28 DIAGNOSIS — R1900 Intra-abdominal and pelvic swelling, mass and lump, unspecified site: Secondary | ICD-10-CM | POA: Insufficient documentation

## 2017-01-28 DIAGNOSIS — J441 Chronic obstructive pulmonary disease with (acute) exacerbation: Secondary | ICD-10-CM | POA: Insufficient documentation

## 2017-01-28 DIAGNOSIS — I5022 Chronic systolic (congestive) heart failure: Secondary | ICD-10-CM | POA: Insufficient documentation

## 2017-01-28 LAB — LIPASE, BLOOD: LIPASE: 25 U/L (ref 11–51)

## 2017-01-28 LAB — CBC
HEMATOCRIT: 38.9 % — AB (ref 39.0–52.0)
Hemoglobin: 12.8 g/dL — ABNORMAL LOW (ref 13.0–17.0)
MCH: 29.8 pg (ref 26.0–34.0)
MCHC: 32.9 g/dL (ref 30.0–36.0)
MCV: 90.5 fL (ref 78.0–100.0)
Platelets: 258 10*3/uL (ref 150–400)
RBC: 4.3 MIL/uL (ref 4.22–5.81)
RDW: 13.1 % (ref 11.5–15.5)
WBC: 8.2 10*3/uL (ref 4.0–10.5)

## 2017-01-28 LAB — D-DIMER, QUANTITATIVE: D-Dimer, Quant: 1.34 ug/mL-FEU — ABNORMAL HIGH (ref 0.00–0.50)

## 2017-01-28 LAB — HEPATIC FUNCTION PANEL
ALK PHOS: 64 U/L (ref 38–126)
ALT: 69 U/L — ABNORMAL HIGH (ref 17–63)
AST: 83 U/L — ABNORMAL HIGH (ref 15–41)
Albumin: 3.3 g/dL — ABNORMAL LOW (ref 3.5–5.0)
BILIRUBIN DIRECT: 0.3 mg/dL (ref 0.1–0.5)
BILIRUBIN INDIRECT: 0.9 mg/dL (ref 0.3–0.9)
TOTAL PROTEIN: 6.8 g/dL (ref 6.5–8.1)
Total Bilirubin: 1.2 mg/dL (ref 0.3–1.2)

## 2017-01-28 LAB — BASIC METABOLIC PANEL
Anion gap: 9 (ref 5–15)
BUN: 14 mg/dL (ref 6–20)
CALCIUM: 8.5 mg/dL — AB (ref 8.9–10.3)
CO2: 22 mmol/L (ref 22–32)
Chloride: 101 mmol/L (ref 101–111)
Creatinine, Ser: 1.24 mg/dL (ref 0.61–1.24)
GFR calc Af Amer: >60 mL/min (ref >60)
GLUCOSE: 111 mg/dL — AB (ref 65–99)
Potassium: 3.5 mmol/L (ref 3.5–5.1)
Sodium: 132 mmol/L — ABNORMAL LOW (ref 135–145)

## 2017-01-28 LAB — BRAIN NATRIURETIC PEPTIDE: B Natriuretic Peptide: 2506 pg/mL — ABNORMAL HIGH (ref 0.0–100.0)

## 2017-01-28 MED ORDER — ALBUTEROL SULFATE (2.5 MG/3ML) 0.083% IN NEBU: 2.5000 mg | INHALATION_SOLUTION | Freq: Four times a day (QID) | RESPIRATORY_TRACT | 12 refills | Status: DC | PRN

## 2017-01-28 MED ORDER — IOPAMIDOL (ISOVUE-370) INJECTION 76%
100.0000 mL | Freq: Once | INTRAVENOUS | Status: AC | PRN
  Administered 2017-01-28: 100 mL via INTRAVENOUS

## 2017-01-28 MED ORDER — ALBUTEROL SULFATE HFA 108 (90 BASE) MCG/ACT IN AERS
2.0000 | INHALATION_SPRAY | Freq: Four times a day (QID) | RESPIRATORY_TRACT | Status: DC
  Administered 2017-01-28: 2 via RESPIRATORY_TRACT
  Filled 2017-01-28: qty 6.7

## 2017-01-28 NOTE — Discharge Instructions (Signed)
Extensive workup without other causes for the shortness of breath. Still has something to do with like some COPD or emphysema exacerbation. Use the albuterol every 6 hours either by inhaler or by nebulizer. Prescription provided. Return for any new or worse symptoms. CT scan of the chest and abdomen and pelvis without any acute findings other than some findings of COPD. No evidence of blood clots in the lungs. No evidence of any fluid on the lungs. No evidence of fluid in the abdomen.

## 2017-01-28 NOTE — ED Triage Notes (Signed)
Pt reports that he has hx of CHF. Pt states he has been having increasing SOB over the past 2 weeks. Pt feels like abdomen is swollen. Coughing frequently between breaths

## 2017-01-28 NOTE — ED Provider Notes (Signed)
[facility] DEPT Provider Note   CSN: [id number] Arrival date & time: 01/28/17  1332     History   Chief Complaint Chief Complaint  Patient presents with  . Shortness of Breath    HPI Jonathan Wood is a 46 y.o. male.  Patient reports a history of congestive heart failure. No increase shortness of breath over the past 2 weeks but got significantly worse today. Coughing frequently when he is breathing. Abdomen is swollen is worried about fluid on his abdomen. No leg swelling. Denies fevers.      Past Medical History:  Diagnosis Date  . Asthma   . Cardiomegaly   . CHF (congestive heart failure) (HCC)   . GERD (gastroesophageal reflux disease)   . History of hiatal hernia     Patient Active Problem List   Diagnosis Date Noted  . 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 Acute left systolic heart failure (HCC) 10/07/2016    Past Surgical History:  Procedure Laterality Date  . NASAL FRACTURE SURGERY  1987  . RIGHT/LEFT HEART CATH AND CORONARY ANGIOGRAPHY N/A 10/08/2016   Procedure: Right/Left Heart Cath and Coronary Angiography;  Surgeon: Orpah Cobb, MD;  Location: [facility] INVASIVE CV LAB;  Service: Cardiovascular;  Laterality: N/A;       Home Medications    Prior to Admission medications   Medication Sig Start Date End Date Taking? Authorizing Provider  albuterol (PROVENTIL HFA;VENTOLIN HFA) 108 (90 Base) MCG/ACT inhaler Inhale into the lungs every 6 (six) hours as needed for wheezing or shortness of breath.   Yes [provider]  carvedilol (COREG) 6.25 MG tablet Take 1 tablet (6.25 mg total) by mouth 2 (two) times daily with a meal. 10/10/16  Yes Orpah Cobb, MD  furosemide (LASIX) 40 MG tablet Take 1 tablet (40 mg total) by mouth daily. 10/10/16  Yes Orpah Cobb, MD  sacubitril-valsartan (ENTRESTO) 24-26 MG Take 1 tablet by mouth 2 (two) times daily. 10/10/16  Yes Orpah Cobb, MD  albuterol (PROVENTIL) (2.5 MG/3ML) 0.083% nebulizer  solution Take 3 mLs (2.5 mg total) by nebulization every 6 (six) hours as needed for wheezing or shortness of breath. 01/28/17   Vanetta Mulders, MD    Family History No family history on file.  Social History Social History  Substance Use Topics  . Smoking status: Former Smoker    Packs/day: 1.00    Years: 25.00    Types: Cigarettes  . Smokeless tobacco: Never Used     Comment: quit 1 week  . Alcohol use Yes     Comment: no beer in 2 weeks     Allergies   Patient has no known allergies.   Review of Systems Review of Systems  Constitutional: Negative for fever.  HENT: Negative for congestion.   Eyes: Negative for visual disturbance.  Respiratory: Positive for cough and shortness of breath.   Cardiovascular: Negative for chest pain and leg swelling.  Gastrointestinal: Positive for abdominal distention. Negative for abdominal pain.  Genitourinary: Negative for dysuria.  Musculoskeletal: Negative for back pain.  Skin: Negative for rash.  Neurological: Negative for syncope.  Hematological: Does not bruise/bleed easily.  Psychiatric/Behavioral: Negative for confusion.     Physical Exam Updated Vital Signs BP 103/79   Pulse 100   Temp 97.6 F (36.4 C) (Oral)   Resp 17   Wt 97.1 kg (214 lb)   SpO2 99%   BMI 27.48 kg/m   Physical Exam  Constitutional: He is oriented to person, place, and time. He  appears well-developed and well-nourished. No distress.  HENT:  Head: Normocephalic and atraumatic.  Mouth/Throat: Oropharynx is clear and moist.  Eyes: Pupils are equal, round, and reactive to light. Conjunctivae and EOM are normal.  Neck: Neck supple.  Cardiovascular: Normal rate, regular rhythm and normal heart sounds.   Pulmonary/Chest: Effort normal and breath sounds normal. He has no wheezes. He has no rales.  Abdominal: Soft. Bowel sounds are normal. He exhibits distension. There is no tenderness.  Musculoskeletal: Normal range of motion. He exhibits no edema.    Neurological: He is alert and oriented to person, place, and time. No cranial nerve deficit or sensory deficit. He exhibits normal muscle tone. Coordination normal.  Skin: Skin is warm. No rash noted.  Nursing note and vitals reviewed.    ED Treatments / Results  Labs (all labs ordered are listed, but only abnormal results are displayed) Labs Reviewed  BASIC METABOLIC PANEL - Abnormal; Notable for the following:       Result Value   Sodium 132 (*)    Glucose, Bld 111 (*)    Calcium 8.5 (*)    All other components within normal limits  CBC - Abnormal; Notable for the following:    Hemoglobin 12.8 (*)    HCT 38.9 (*)    All other components within normal limits  BRAIN NATRIURETIC PEPTIDE - Abnormal; Notable for the following:    B Natriuretic Peptide 2,506.0 (*)    All other components within normal limits  HEPATIC FUNCTION PANEL - Abnormal; Notable for the following:    Albumin 3.3 (*)    AST 83 (*)    ALT 69 (*)    All other components within normal limits  D-DIMER, QUANTITATIVE (NOT AT [facility]) - Abnormal; Notable for the following:    D-Dimer, Quant 1.34 (*)    All other components within normal limits  LIPASE, BLOOD  I-STAT TROPONIN, ED    EKG  EKG Interpretation  Date/Time:  Friday January 28 2017 13:54:51 EDT Ventricular Rate:  101 PR Interval:    QRS Duration: 76 QT Interval:  377 QTC Calculation: 489 R Axis:   73 Text Interpretation:  Sinus tachycardia Probable left atrial enlargement Anterior infarct, old Borderline T abnormalities, inferior leads No significant change since last tracing Confirmed by Vanetta Mulders [phone]) on 01/28/2017 2:31:10 PM       Radiology Dg Chest 2 View  Result Date: 01/28/2017 CLINICAL DATA:  Dry cough.  CHF. EXAM: CHEST  2 VIEW COMPARISON:  01/17/2017 FINDINGS: Mild CHF. No pleural effusion identified. Mild diffuse edema identified. No airspace opacities. The visualized osseous structures are unremarkable. IMPRESSION: 1.  Cardiac enlargement and mild edema. Electronically Signed   By: Signa Kell M.D.   On: 01/28/2017 14:44   Ct Angio Chest Pe W/cm &/or Wo Cm  Result Date: 01/28/2017 CLINICAL DATA:  Shortness of breath.  Abdominal swelling. EXAM: CT ANGIOGRAPHY CHEST CT ABDOMEN AND PELVIS WITH CONTRAST TECHNIQUE: Multidetector CT imaging of the chest was performed using the standard protocol during bolus administration of intravenous contrast. Multiplanar CT image reconstructions and MIPs were obtained to evaluate the vascular anatomy. Multidetector CT imaging of the abdomen and pelvis was performed using the standard protocol during bolus administration of intravenous contrast. CONTRAST:  100 mL Isovue 370 COMPARISON:  CT abdomen pelvis 08/07/2013 CTA chest 01/17/2017 FINDINGS: CTA CHEST FINDINGS Cardiovascular: Contrast injection is sufficient to demonstrate satisfactory opacification of the pulmonary arteries to the segmental level. There is no pulmonary embolus. The main pulmonary  artery is within normal limits for size. There is no CT evidence of acute right heart strain. The visualized aorta is normal. There is a normal 3-vessel arch branching pattern. Heart size is enlarged. No pericardial effusion. Mediastinum/Nodes: No mediastinal, hilar or axillary lymphadenopathy. The visualized thyroid is normal. Lungs/Pleura: Mild bilateral emphysema. Multifocal atelectasis. Small right pleural effusion. Musculoskeletal: No chest wall abnormality. No acute or significant osseous findings. Review of the MIP images confirms the above findings. CT ABDOMEN and PELVIS FINDINGS Hepatobiliary: There is hepatic steatosis. No focal liver lesion. No biliary dilatation. Normal gallbladder. Pancreas: Normal contours without ductal dilatation. No peripancreatic fluid collection. Spleen: Normal. Adrenals/Urinary Tract: --Adrenal glands: Normal. --Right kidney/ureter: No hydronephrosis or perinephric stranding. No nephrolithiasis. No obstructing  ureteral stones. --Left kidney/ureter: 2.8 cm left upper pole cyst. --Urinary bladder: Unremarkable. Stomach/Bowel: --Stomach/Duodenum: Patulous distal esophagus. --Small bowel: No dilatation or inflammation. --Colon: No focal abnormality. --Appendix: Not visualized. No right lower quadrant inflammation or free fluid. Vascular/Lymphatic: Normal course and caliber of the major abdominal vessels. No abdominal or pelvic lymphadenopathy. Reproductive: No free fluid in the pelvis. Musculoskeletal. No bony spinal canal stenosis or focal osseous abnormality. Other: None. Review of the MIP images confirms the above findings. IMPRESSION: 1. No pulmonary embolus. 2. No acute abnormality of the chest, abdomen or pelvis. 3. Hepatic steatosis. 4.  Emphysema (ICD10-J43.9) and small right pleural effusion. Electronically Signed   By: Deatra Robinson M.D.   On: 01/28/2017 19:18   Ct Abdomen Pelvis W Contrast  Result Date: 01/28/2017 CLINICAL DATA:  Shortness of breath.  Abdominal swelling. EXAM: CT ANGIOGRAPHY CHEST CT ABDOMEN AND PELVIS WITH CONTRAST TECHNIQUE: Multidetector CT imaging of the chest was performed using the standard protocol during bolus administration of intravenous contrast. Multiplanar CT image reconstructions and MIPs were obtained to evaluate the vascular anatomy. Multidetector CT imaging of the abdomen and pelvis was performed using the standard protocol during bolus administration of intravenous contrast. CONTRAST:  100 mL Isovue 370 COMPARISON:  CT abdomen pelvis 08/07/2013 CTA chest 01/17/2017 FINDINGS: CTA CHEST FINDINGS Cardiovascular: Contrast injection is sufficient to demonstrate satisfactory opacification of the pulmonary arteries to the segmental level. There is no pulmonary embolus. The main pulmonary artery is within normal limits for size. There is no CT evidence of acute right heart strain. The visualized aorta is normal. There is a normal 3-vessel arch branching pattern. Heart size is  enlarged. No pericardial effusion. Mediastinum/Nodes: No mediastinal, hilar or axillary lymphadenopathy. The visualized thyroid is normal. Lungs/Pleura: Mild bilateral emphysema. Multifocal atelectasis. Small right pleural effusion. Musculoskeletal: No chest wall abnormality. No acute or significant osseous findings. Review of the MIP images confirms the above findings. CT ABDOMEN and PELVIS FINDINGS Hepatobiliary: There is hepatic steatosis. No focal liver lesion. No biliary dilatation. Normal gallbladder. Pancreas: Normal contours without ductal dilatation. No peripancreatic fluid collection. Spleen: Normal. Adrenals/Urinary Tract: --Adrenal glands: Normal. --Right kidney/ureter: No hydronephrosis or perinephric stranding. No nephrolithiasis. No obstructing ureteral stones. --Left kidney/ureter: 2.8 cm left upper pole cyst. --Urinary bladder: Unremarkable. Stomach/Bowel: --Stomach/Duodenum: Patulous distal esophagus. --Small bowel: No dilatation or inflammation. --Colon: No focal abnormality. --Appendix: Not visualized. No right lower quadrant inflammation or free fluid. Vascular/Lymphatic: Normal course and caliber of the major abdominal vessels. No abdominal or pelvic lymphadenopathy. Reproductive: No free fluid in the pelvis. Musculoskeletal. No bony spinal canal stenosis or focal osseous abnormality. Other: None. Review of the MIP images confirms the above findings. IMPRESSION: 1. No pulmonary embolus. 2. No acute abnormality of the chest, abdomen or pelvis.  3. Hepatic steatosis. 4.  Emphysema (ICD10-J43.9) and small right pleural effusion. Electronically Signed   By: Deatra Robinson M.D.   On: 01/28/2017 19:18    Procedures Procedures (including critical care time)  Medications Ordered in ED Medications  albuterol (PROVENTIL HFA;VENTOLIN HFA) 108 (90 Base) MCG/ACT inhaler 2 puff (not administered)  iopamidol (ISOVUE-370) 76 % injection 100 mL (100 mLs Intravenous Contrast Given 01/28/17 1841)      Initial Impression / Assessment and Plan / ED Course  I have reviewed the triage vital signs and the nursing notes.  Pertinent labs & imaging results that were available during my care of the patient were reviewed by me and considered in my medical decision making (see chart for details).    Extensive workup to explain the shortness of breath. Chest x-ray was negative for pulmonary edema or pneumonia. BMP was elevated 2500. N d-dimer was elevated greater than 1. Based on this patient is CT angios chest and since he was concerned about fluid in his abdomen had CT of his abdomen and pelvis. No acute findings no pulmonary embolus. No evidence of pneumonia or pulmonary edema. A small pleural effusion. CT of abdomen and pelvis without any acute amount.  After this extensive workup patient stated that he needed a refill of his nebulizer solution at home they've been out of for the past few days. Patient's past medical history does not necessarily list emphysema or COPD. However CT did raise concerns for emphysema changes. Also patient is recently stopped smoking about a week ago. No wheezing. But symptoms could be consistent with bronchitis and may explain his shortness of breath. Patient had his nebulizer treatments renewed and given albuterol inhaler to use until he can have his prescriptions filled. Suspect that patient's shortness of breath here this evening was predominantly related to COPD exacerbation although no distinct wheezing. Also despite the BMP no evidence of any pulmonary edema.  Patient without hypoxia. Question saturations were fine   Final Clinical Impressions(s) / ED Diagnoses   Final diagnoses:  COPD exacerbation (HCC)    New Prescriptions New Prescriptions   ALBUTEROL (PROVENTIL) (2.5 MG/3ML) 0.083% NEBULIZER SOLUTION    Take 3 mLs (2.5 mg total) by nebulization every 6 (six) hours as needed for wheezing or shortness of breath.     Vanetta Mulders, MD 01/28/17  2224

## 2017-04-15 ENCOUNTER — Ambulatory Visit: Payer: Self-pay | Admitting: Pulmonary Disease

## 2017-08-25 ENCOUNTER — Other Ambulatory Visit: Payer: Self-pay

## 2017-08-25 ENCOUNTER — Emergency Department (HOSPITAL_COMMUNITY): Payer: Medicaid Other

## 2017-08-25 ENCOUNTER — Inpatient Hospital Stay (HOSPITAL_COMMUNITY)
Admission: EM | Admit: 2017-08-25 | Discharge: 2017-09-19 | DRG: 001 | Disposition: A | Discharge status: Home / Self Care | Payer: Medicaid Other | Attending: Internal Medicine | Admitting: Internal Medicine

## 2017-08-25 ENCOUNTER — Encounter (HOSPITAL_COMMUNITY): Payer: Self-pay | Admitting: Emergency Medicine

## 2017-08-25 DIAGNOSIS — I509 Heart failure, unspecified: Secondary | ICD-10-CM

## 2017-08-25 DIAGNOSIS — Z79899 Other long term (current) drug therapy: Secondary | ICD-10-CM

## 2017-08-25 DIAGNOSIS — F329 Major depressive disorder, single episode, unspecified: Secondary | ICD-10-CM | POA: Diagnosis present

## 2017-08-25 DIAGNOSIS — R945 Abnormal results of liver function studies: Secondary | ICD-10-CM | POA: Diagnosis not present

## 2017-08-25 DIAGNOSIS — D62 Acute posthemorrhagic anemia: Secondary | ICD-10-CM | POA: Diagnosis not present

## 2017-08-25 DIAGNOSIS — I493 Ventricular premature depolarization: Secondary | ICD-10-CM | POA: Diagnosis not present

## 2017-08-25 DIAGNOSIS — R570 Cardiogenic shock: Secondary | ICD-10-CM | POA: Diagnosis present

## 2017-08-25 DIAGNOSIS — J939 Pneumothorax, unspecified: Secondary | ICD-10-CM

## 2017-08-25 DIAGNOSIS — K7200 Acute and subacute hepatic failure without coma: Secondary | ICD-10-CM | POA: Diagnosis present

## 2017-08-25 DIAGNOSIS — I255 Ischemic cardiomyopathy: Secondary | ICD-10-CM | POA: Diagnosis present

## 2017-08-25 DIAGNOSIS — Z7189 Other specified counseling: Secondary | ICD-10-CM

## 2017-08-25 DIAGNOSIS — R7989 Other specified abnormal findings of blood chemistry: Secondary | ICD-10-CM | POA: Diagnosis present

## 2017-08-25 DIAGNOSIS — I420 Dilated cardiomyopathy: Secondary | ICD-10-CM | POA: Diagnosis present

## 2017-08-25 DIAGNOSIS — I340 Nonrheumatic mitral (valve) insufficiency: Secondary | ICD-10-CM | POA: Diagnosis not present

## 2017-08-25 DIAGNOSIS — R1011 Right upper quadrant pain: Secondary | ICD-10-CM | POA: Diagnosis present

## 2017-08-25 DIAGNOSIS — N170 Acute kidney failure with tubular necrosis: Secondary | ICD-10-CM | POA: Diagnosis not present

## 2017-08-25 DIAGNOSIS — F17210 Nicotine dependence, cigarettes, uncomplicated: Secondary | ICD-10-CM | POA: Diagnosis present

## 2017-08-25 DIAGNOSIS — K5900 Constipation, unspecified: Secondary | ICD-10-CM | POA: Diagnosis not present

## 2017-08-25 DIAGNOSIS — I272 Pulmonary hypertension, unspecified: Secondary | ICD-10-CM | POA: Diagnosis present

## 2017-08-25 DIAGNOSIS — R109 Unspecified abdominal pain: Secondary | ICD-10-CM

## 2017-08-25 DIAGNOSIS — I5023 Acute on chronic systolic (congestive) heart failure: Secondary | ICD-10-CM | POA: Diagnosis present

## 2017-08-25 DIAGNOSIS — Z95811 Presence of heart assist device: Secondary | ICD-10-CM | POA: Diagnosis not present

## 2017-08-25 DIAGNOSIS — I5043 Acute on chronic combined systolic (congestive) and diastolic (congestive) heart failure: Secondary | ICD-10-CM | POA: Diagnosis present

## 2017-08-25 DIAGNOSIS — Z7951 Long term (current) use of inhaled steroids: Secondary | ICD-10-CM

## 2017-08-25 DIAGNOSIS — R0600 Dyspnea, unspecified: Secondary | ICD-10-CM | POA: Diagnosis not present

## 2017-08-25 DIAGNOSIS — M109 Gout, unspecified: Secondary | ICD-10-CM | POA: Diagnosis present

## 2017-08-25 DIAGNOSIS — N183 Chronic kidney disease, stage 3 (moderate): Secondary | ICD-10-CM | POA: Diagnosis present

## 2017-08-25 DIAGNOSIS — I428 Other cardiomyopathies: Secondary | ICD-10-CM | POA: Diagnosis not present

## 2017-08-25 DIAGNOSIS — E876 Hypokalemia: Secondary | ICD-10-CM | POA: Diagnosis not present

## 2017-08-25 DIAGNOSIS — Z7982 Long term (current) use of aspirin: Secondary | ICD-10-CM | POA: Diagnosis not present

## 2017-08-25 DIAGNOSIS — F1010 Alcohol abuse, uncomplicated: Secondary | ICD-10-CM | POA: Diagnosis present

## 2017-08-25 DIAGNOSIS — R1013 Epigastric pain: Secondary | ICD-10-CM

## 2017-08-25 DIAGNOSIS — J189 Pneumonia, unspecified organism: Secondary | ICD-10-CM | POA: Diagnosis not present

## 2017-08-25 DIAGNOSIS — Z515 Encounter for palliative care: Secondary | ICD-10-CM

## 2017-08-25 DIAGNOSIS — N179 Acute kidney failure, unspecified: Secondary | ICD-10-CM | POA: Diagnosis present

## 2017-08-25 DIAGNOSIS — R0602 Shortness of breath: Secondary | ICD-10-CM

## 2017-08-25 DIAGNOSIS — J45909 Unspecified asthma, uncomplicated: Secondary | ICD-10-CM | POA: Diagnosis present

## 2017-08-25 DIAGNOSIS — K219 Gastro-esophageal reflux disease without esophagitis: Secondary | ICD-10-CM | POA: Diagnosis present

## 2017-08-25 DIAGNOSIS — F419 Anxiety disorder, unspecified: Secondary | ICD-10-CM | POA: Diagnosis present

## 2017-08-25 DIAGNOSIS — I472 Ventricular tachycardia: Secondary | ICD-10-CM | POA: Diagnosis not present

## 2017-08-25 DIAGNOSIS — Z01818 Encounter for other preprocedural examination: Secondary | ICD-10-CM

## 2017-08-25 DIAGNOSIS — K761 Chronic passive congestion of liver: Secondary | ICD-10-CM | POA: Diagnosis present

## 2017-08-25 DIAGNOSIS — Z01810 Encounter for preprocedural cardiovascular examination: Secondary | ICD-10-CM | POA: Diagnosis not present

## 2017-08-25 DIAGNOSIS — I130 Hypertensive heart and chronic kidney disease with heart failure and stage 1 through stage 4 chronic kidney disease, or unspecified chronic kidney disease: Principal | ICD-10-CM | POA: Diagnosis present

## 2017-08-25 DIAGNOSIS — R0781 Pleurodynia: Secondary | ICD-10-CM

## 2017-08-25 DIAGNOSIS — Z452 Encounter for adjustment and management of vascular access device: Secondary | ICD-10-CM

## 2017-08-25 LAB — TROPONIN I
TROPONIN I: 0.04 ng/mL — AB (ref <0.03)
Troponin I: 0.05 ng/mL (ref <0.03)

## 2017-08-25 LAB — HEPATIC FUNCTION PANEL
ALT: 288 U/L — ABNORMAL HIGH (ref 17–63)
AST: 224 U/L — ABNORMAL HIGH (ref 15–41)
Albumin: 3.2 g/dL — ABNORMAL LOW (ref 3.5–5.0)
Alkaline Phosphatase: 92 U/L (ref 38–126)
BILIRUBIN DIRECT: 0.5 mg/dL (ref 0.1–0.5)
BILIRUBIN TOTAL: 1.7 mg/dL — AB (ref 0.3–1.2)
Indirect Bilirubin: 1.2 mg/dL — ABNORMAL HIGH (ref 0.3–0.9)
Total Protein: 6.5 g/dL (ref 6.5–8.1)

## 2017-08-25 LAB — BASIC METABOLIC PANEL
Anion gap: 11 (ref 5–15)
BUN: 33 mg/dL — AB (ref 6–20)
CALCIUM: 8.7 mg/dL — AB (ref 8.9–10.3)
CHLORIDE: 100 mmol/L — AB (ref 101–111)
CO2: 22 mmol/L (ref 22–32)
CREATININE: 1.49 mg/dL — AB (ref 0.61–1.24)
GFR calc Af Amer: >60 mL/min (ref >60)
GFR calc non Af Amer: 55 mL/min — ABNORMAL LOW (ref >60)
Glucose, Bld: 117 mg/dL — ABNORMAL HIGH (ref 65–99)
Potassium: 3.7 mmol/L (ref 3.5–5.1)
SODIUM: 133 mmol/L — AB (ref 135–145)

## 2017-08-25 LAB — LIPASE, BLOOD: LIPASE: 25 U/L (ref 11–51)

## 2017-08-25 LAB — CBC
HCT: 35.8 % — ABNORMAL LOW (ref 39.0–52.0)
Hemoglobin: 11.4 g/dL — ABNORMAL LOW (ref 13.0–17.0)
MCH: 28.9 pg (ref 26.0–34.0)
MCHC: 31.8 g/dL (ref 30.0–36.0)
MCV: 90.6 fL (ref 78.0–100.0)
PLATELETS: 174 10*3/uL (ref 150–400)
RBC: 3.95 MIL/uL — ABNORMAL LOW (ref 4.22–5.81)
RDW: 13.6 % (ref 11.5–15.5)
WBC: 8 10*3/uL (ref 4.0–10.5)

## 2017-08-25 LAB — BRAIN NATRIURETIC PEPTIDE: B NATRIURETIC PEPTIDE 5: 2510 pg/mL — AB (ref 0.0–100.0)

## 2017-08-25 MED ORDER — TRAZODONE HCL 50 MG PO TABS
50.0000 mg | ORAL_TABLET | Freq: Every day | ORAL | Status: DC
  Administered 2017-08-25 – 2017-09-05 (×8): 50 mg via ORAL
  Filled 2017-08-25 (×11): qty 1

## 2017-08-25 MED ORDER — SODIUM CHLORIDE 0.9% FLUSH
3.0000 mL | INTRAVENOUS | Status: DC | PRN
  Administered 2017-08-29: 3 mL via INTRAVENOUS
  Filled 2017-08-25: qty 3

## 2017-08-25 MED ORDER — FUROSEMIDE 10 MG/ML IJ SOLN
INTRAMUSCULAR | Status: AC
  Filled 2017-08-25: qty 30

## 2017-08-25 MED ORDER — FUROSEMIDE 10 MG/ML IJ SOLN
8.0000 mg/h | INTRAVENOUS | Status: DC
  Administered 2017-08-25 – 2017-08-27 (×2): 8 mg/h via INTRAVENOUS
  Filled 2017-08-25: qty 25
  Filled 2017-08-25: qty 10
  Filled 2017-08-25: qty 25

## 2017-08-25 MED ORDER — SODIUM CHLORIDE 0.9% FLUSH
3.0000 mL | Freq: Two times a day (BID) | INTRAVENOUS | Status: DC
  Administered 2017-08-26 – 2017-09-03 (×10): 3 mL via INTRAVENOUS

## 2017-08-25 MED ORDER — ALBUTEROL SULFATE (2.5 MG/3ML) 0.083% IN NEBU
2.5000 mg | INHALATION_SOLUTION | Freq: Four times a day (QID) | RESPIRATORY_TRACT | Status: DC | PRN
  Administered 2017-09-01: 2.5 mg via RESPIRATORY_TRACT
  Filled 2017-08-25: qty 3

## 2017-08-25 MED ORDER — SODIUM CHLORIDE 0.9 % IV SOLN
250.0000 mL | INTRAVENOUS | Status: DC | PRN
  Administered 2017-08-30: 18:00:00 via INTRAVENOUS
  Administered 2017-08-31 – 2017-09-01 (×2): 250 mL via INTRAVENOUS

## 2017-08-25 MED ORDER — POTASSIUM CHLORIDE CRYS ER 20 MEQ PO TBCR
40.0000 meq | EXTENDED_RELEASE_TABLET | Freq: Two times a day (BID) | ORAL | Status: DC
  Administered 2017-08-25 – 2017-08-27 (×4): 40 meq via ORAL
  Filled 2017-08-25 (×4): qty 2

## 2017-08-25 MED ORDER — ACETAMINOPHEN 325 MG PO TABS
650.0000 mg | ORAL_TABLET | ORAL | Status: DC | PRN
  Administered 2017-08-26 – 2017-09-05 (×6): 650 mg via ORAL
  Filled 2017-08-25 (×5): qty 2

## 2017-08-25 MED ORDER — ONDANSETRON HCL 4 MG/2ML IJ SOLN: 4.0000 mg | Freq: Four times a day (QID) | INTRAMUSCULAR | Status: DC | PRN

## 2017-08-25 MED ORDER — ASPIRIN EC 81 MG PO TBEC
81.0000 mg | DELAYED_RELEASE_TABLET | Freq: Every day | ORAL | Status: DC
  Administered 2017-08-26 – 2017-09-05 (×11): 81 mg via ORAL
  Filled 2017-08-25 (×11): qty 1

## 2017-08-25 MED ORDER — ENOXAPARIN SODIUM 40 MG/0.4ML ~~LOC~~ SOLN
40.0000 mg | SUBCUTANEOUS | Status: DC
  Administered 2017-08-25 – 2017-08-29 (×5): 40 mg via SUBCUTANEOUS
  Filled 2017-08-25 (×5): qty 0.4

## 2017-08-25 NOTE — H&P (Signed)
History and Physical    QUINELL SIWICKI ZMO:[id number] DOB: June 05, 1970 DOA: 08/25/2017  PCP: Patient, No Pcp Per  Patient coming from: Home  I have personally briefly reviewed patient's old medical records in [facility]  Chief Complaint: Right upper quadrant pain, shortness of breath  HPI: NOBERT HAMMONS is a 47 y.o. male with medical history significant of nonischemic cardiomyopathy with ejection fraction of 10%, followed by Dr. Algie Coffer in [place].  The patient reports compliance of medications.  Over the past 4-5 days, he has had development of right upper quadrant pain, shortness of breath and cough.  He does not have any associated vomiting.  Abdominal pain does not worsen after eating or drinking.  He is not had any diarrhea.  No fever.  Pain is a constant type pain.  He is also been feeling more short of breath and dyspnea on exertion.  He has developed a cough which she says usually happens when he starts retaining fluid.  He has been taking his diuretics as prescribed but has noticed a decrease in urine output.  He reports that his weight has been trending up.  He was seen at [facility] 3 times in the past week and was not noted to have any significant findings.  He underwent CT scan, abdominal ultrasound and HIDA scan, all of which did not point to any significant biliary findings.  LFTs were noted to be elevated.  Viral hepatitis panel was noted to be negative.  There was some mention of possible hepatic congestion.  He comes today to the emergency room due to persistence of symptoms.  ED Course: Blood pressure was noted to be on the lower side between the 80s and 90s.  Chest x-ray did not show any clear volume overload.  LFTs were noted to be elevated at 224/288 for AST/ALT.  Lipase was noted to be normal.  BNP noted to be elevated 2500 as it has been in the past.  Troponin mildly elevated at 0.04.  Mild elevation of creatinine at 1.49.  EKG did not show any acute  changes.  There was sinus rhythm with occasional PVCs.  Review of Systems: As per HPI otherwise 10 point review of systems negative.    Past Medical History:  Diagnosis Date  . Asthma   . Cardiomegaly   . CHF (congestive heart failure) (HCC)   . GERD (gastroesophageal reflux disease)   . History of hiatal hernia     Past Surgical History:  Procedure Laterality Date  . NASAL FRACTURE SURGERY  1987  . RIGHT/LEFT HEART CATH AND CORONARY ANGIOGRAPHY N/A 10/08/2016   Procedure: Right/Left Heart Cath and Coronary Angiography;  Surgeon: Orpah Cobb, MD;  Location: [facility] INVASIVE CV LAB;  Service: Cardiovascular;  Laterality: N/A;     reports that he has been smoking cigarettes.  He has a 12.50 pack-year smoking history. He has never used smokeless tobacco. He reports that he drinks alcohol. He reports that he does not use drugs.  No Known Allergies  Family history: No significant history of early onset heart failure  Prior to Admission medications   Medication Sig Start Date End Date Taking? Authorizing Provider  albuterol (PROVENTIL HFA;VENTOLIN HFA) 108 (90 Base) MCG/ACT inhaler Inhale into the lungs every 6 (six) hours as needed for wheezing or shortness of breath.   Yes [provider]  albuterol (PROVENTIL) (2.5 MG/3ML) 0.083% nebulizer solution Take 3 mLs (2.5 mg total) by nebulization every 6 (six) hours as needed for wheezing or  shortness of breath. 01/28/17  Yes Vanetta Mulders, MD  aspirin EC 81 MG tablet Take 81 mg by mouth daily.   Yes [provider]  carvedilol (COREG) 6.25 MG tablet Take 1 tablet (6.25 mg total) by mouth 2 (two) times daily with a meal. 10/10/16  Yes Orpah Cobb, MD  furosemide (LASIX) 80 MG tablet Take 2,400 mg by mouth 2 (two) times daily.   Yes [provider]  sacubitril-valsartan (ENTRESTO) 24-26 MG Take 1 tablet by mouth 2 (two) times daily. 10/10/16  Yes Orpah Cobb, MD  traZODone (DESYREL) 50 MG tablet Take 50 mg by mouth  at bedtime.   Yes [provider]    Physical Exam: Vitals:   08/25/17 1255 08/25/17 1256 08/25/17 1309 08/25/17 1405  BP: (!) 81/49  90/64   Pulse: 89  86   Resp: 20     Temp: 97.8 F (36.6 C)     TempSrc: Oral     SpO2: 100%  100%   Weight: 95.7 kg (211 lb) 95.7 kg (211 lb)  93.6 kg (206 lb 7 oz)  Height: 6\' 2"  (1.88 m) 6\' 2"  (1.88 m)      Constitutional: NAD, calm, comfortable Vitals:   08/25/17 1255 08/25/17 1256 08/25/17 1309 08/25/17 1405  BP: (!) 81/49  90/64   Pulse: 89  86   Resp: 20     Temp: 97.8 F (36.6 C)     TempSrc: Oral     SpO2: 100%  100%   Weight: 95.7 kg (211 lb) 95.7 kg (211 lb)  93.6 kg (206 lb 7 oz)  Height: 6\' 2"  (1.88 m) 6\' 2"  (1.88 m)     Eyes: PERRL, lids and conjunctivae normal ENMT: Mucous membranes are moist. Posterior pharynx clear of any exudate or lesions.Normal dentition.  Neck: normal, supple, no masses, no thyromegaly, elevated JVD Respiratory: clear to auscultation bilaterally, no wheezing, no crackles. Normal respiratory effort. No accessory muscle use.  Cardiovascular: Regular rate and rhythm, no murmurs / rubs / gallops.  Trace to 1+ extremity edema. 2+ pedal pulses. No carotid bruits.  Abdomen: Tenderness over left upper quadrant, no masses palpated. No hepatosplenomegaly. Bowel sounds positive.  Musculoskeletal: no clubbing / cyanosis. No joint deformity upper and lower extremities. Good ROM, no contractures. Normal muscle tone.  Skin: no rashes, lesions, ulcers. No induration Neurologic: CN 2-12 grossly intact. Sensation intact, DTR normal. Strength 5/5 in all 4.  Psychiatric: Normal judgment and insight. Alert and oriented x 3. Normal mood.   Labs on Admission: I have personally reviewed following labs and imaging studies  CBC: Recent Labs  Lab 08/25/17 1258  WBC 8.0  HGB 11.4*  HCT 35.8*  MCV 90.6  PLT 174   Basic Metabolic Panel: Recent Labs  Lab 08/25/17 1258  NA 133*  K 3.7  CL 100*  CO2 22  GLUCOSE  117*  BUN 33*  CREATININE 1.49*  CALCIUM 8.7*   GFR: Estimated Creatinine Clearance: 73.6 mL/min (A) (by C-G formula based on SCr of 1.49 mg/dL (H)). Liver Function Tests: Recent Labs  Lab 08/25/17 1337  AST 224*  ALT 288*  ALKPHOS 92  BILITOT 1.7*  PROT 6.5  ALBUMIN 3.2*   Recent Labs  Lab 08/25/17 1337  LIPASE 25   No results for input(s): AMMONIA in the last 168 hours. Coagulation Profile: No results for input(s): INR, PROTIME in the last 168 hours. Cardiac Enzymes: Recent Labs  Lab 08/25/17 1258  TROPONINI 0.04*   BNP (last 3  results) No results for input(s): PROBNP in the last 8760 hours. HbA1C: No results for input(s): HGBA1C in the last 72 hours. CBG: No results for input(s): GLUCAP in the last 168 hours. Lipid Profile: No results for input(s): CHOL, HDL, LDLCALC, TRIG, CHOLHDL, LDLDIRECT in the last 72 hours. Thyroid Function Tests: No results for input(s): TSH, T4TOTAL, FREET4, T3FREE, THYROIDAB in the last 72 hours. Anemia Panel: No results for input(s): VITAMINB12, FOLATE, FERRITIN, TIBC, IRON, RETICCTPCT in the last 72 hours. Urine analysis: No results found for: COLORURINE, APPEARANCEUR, LABSPEC, PHURINE, GLUCOSEU, HGBUR, BILIRUBINUR, KETONESUR, PROTEINUR, UROBILINOGEN, NITRITE, LEUKOCYTESUR  Radiological Exams on Admission: Dg Chest 2 View  Result Date: 08/25/2017 CLINICAL DATA:  Chest pain EXAM: CHEST - 2 VIEW COMPARISON:  August 15, 2017 FINDINGS: There is mild perihilar scarring on the left. There is no edema or consolidation. Heart is mildly enlarged, stable, with pulmonary vascularity within normal limits. No adenopathy. No bone lesions. IMPRESSION: Stable mild cardiomegaly. No edema or consolidation. Mild left-sided perihilar scarring is stable. Electronically Signed   By: Bretta Bang III M.D.   On: 08/25/2017 13:24    EKG: Independently reviewed.  Sinus rhythm with occasional PVC.  Prolonged QT.  Assessment/Plan Active Problems:    Congestive dilated cardiomyopathy (HCC)   Acute on chronic systolic CHF (congestive heart failure) (HCC)   Elevated LFTs   Hepatic congestion   RUQ pain   Acute on chronic systolic (congestive) heart failure (HCC)     1. Acute on chronic systolic congestive heart failure.  Patient has a history of nonischemic cardiomyopathy with ejection fraction of 10%.  It appears that his symptoms may be related to some degree of volume overload.  He does have evidence of hepatic congestion, shortness of breath, cough and some lower extremity edema.  He is also noted that his weight has been increasing and has had decreased urine output on his home dose of Lasix.  He chronically takes Lasix 240 mg p.o. twice daily.  I have discussed his care with his primary cardiologist, Dr. Algie Coffer who agrees the patient would likely be best served by transferring to [facility] for cardiac care.  Unfortunately, there are no beds available at this time.  In the interim, we will admit him to stepdown unit at [facility] and start him on treatment.  Once a bed is available at [facility], Dr. Algie Coffer has agreed to have the patient transferred and to assume care.  Since blood pressures are running low, will hold off on further Entresto and carvedilol.  Start the patient on Lasix infusion since systolic blood pressure is in the 80s-90s.  Monitor intake and output.  Repeat echocardiogram.  Cycle cardiac markers.  Will request cardiology consultation at [facility] to assist with managing his heart failure until he is transferred.  He may need inotropic support.  With his low EF, he may also benefit from evaluation for AICD, but we will defer this to cardiology.  May also benefit from heart failure team evaluation, but again will defer to cardiology. 2. Elevated LFTs.  With right upper quadrant pain and elevated LFTs, I suspect this is related to hepatic congestion leading to swelling and stretching of Glisson  capsule.  He is already had a negative HIDA scan in the past few days and viral hepatitis panel is also negative.  We will continue to monitor serial LFTs with ongoing diuresis.  DVT prophylaxis: Lovenox Code Status: Full code Family Communication: No family present Disposition Plan:  Admit to stepdown unit for diuresis, will likely need transfer to [facility] pending bed availability Consults called: Cardiology Admission status: Inpatient, stepdown   Erick Blinks MD [facility] Pager [phone]  If 7PM-7AM, please contact night-coverage [email or web address] Password [facility]  08/25/2017, 5:36 PM

## 2017-08-25 NOTE — ED Provider Notes (Signed)
[facility] EMERGENCY DEPARTMENT Provider Note   CSN: [id number] Arrival date & time: 08/25/17  1246     History   Chief Complaint Chief Complaint  Patient presents with  . Shortness of Breath    HPI Jonathan Wood is a 47 y.o. male.  HPI Patient presents with upper abdominal pain.  Began around 4 days ago.  Has been at [facility] 2 or 3 times for this.  Reportedly had an ultrasound that was negative.  Then said he was treated for constipation.  States he has had a bunch of liquid bowel movements but has not really changed pain.  States decreased appetite.  Also increasing fatigue and shortness of breath.  No real chest pain.  States his weight had gone up by 12 pounds when they weighed him.  No blood in the stool.  No fevers.  Has had a cough without sputum production. Past Medical History:  Diagnosis Date  . Asthma   . Cardiomegaly   . CHF (congestive heart failure) (HCC)   . GERD (gastroesophageal reflux disease)   . History of hiatal hernia     Patient Active Problem List   Diagnosis Date Noted  . 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 Acute left systolic heart failure (HCC) 10/07/2016    Past Surgical History:  Procedure Laterality Date  . NASAL FRACTURE SURGERY  1987  . RIGHT/LEFT HEART CATH AND CORONARY ANGIOGRAPHY N/A 10/08/2016   Procedure: Right/Left Heart Cath and Coronary Angiography;  Surgeon: Orpah Cobb, MD;  Location: [facility] INVASIVE CV LAB;  Service: Cardiovascular;  Laterality: N/A;        Home Medications    Prior to Admission medications   Medication Sig Start Date End Date Taking? Authorizing Provider  albuterol (PROVENTIL HFA;VENTOLIN HFA) 108 (90 Base) MCG/ACT inhaler Inhale into the lungs every 6 (six) hours as needed for wheezing or shortness of breath.   Yes [provider]  albuterol (PROVENTIL) (2.5 MG/3ML) 0.083% nebulizer solution Take 3 mLs (2.5 mg total) by nebulization every 6 (six) hours as needed  for wheezing or shortness of breath. 01/28/17  Yes Vanetta Mulders, MD  aspirin EC 81 MG tablet Take 81 mg by mouth daily.   Yes [provider]  carvedilol (COREG) 6.25 MG tablet Take 1 tablet (6.25 mg total) by mouth 2 (two) times daily with a meal. 10/10/16  Yes Orpah Cobb, MD  cephALEXin (KEFLEX) 500 MG capsule Take 500 mg by mouth 3 (three) times daily.   Yes [provider]  furosemide (LASIX) 80 MG tablet Take 2,400 mg by mouth 2 (two) times daily.   Yes [provider]  sacubitril-valsartan (ENTRESTO) 24-26 MG Take 1 tablet by mouth 2 (two) times daily. 10/10/16  Yes Orpah Cobb, MD  traZODone (DESYREL) 50 MG tablet Take 50 mg by mouth at bedtime.   Yes [provider]    Family History History reviewed. No pertinent family history.  Social History Social History   Tobacco Use  . Smoking status: Current Every Day Smoker    Packs/day: 0.50    Years: 25.00    Pack years: 12.50    Types: Cigarettes  . Smokeless tobacco: Never Used  . Tobacco comment: quit 1 week  Substance Use Topics  . Alcohol use: Yes    Comment: occas  . Drug use: No     Allergies   Patient has no known allergies.   Review of Systems Review of Systems  Constitutional: Positive for appetite change  and fatigue.  HENT: Negative for congestion.   Respiratory: Positive for cough and shortness of breath.   Gastrointestinal: Positive for abdominal pain. Negative for blood in stool, nausea and vomiting.  Genitourinary: Negative for enuresis.  Musculoskeletal: Negative for back pain.  Skin: Negative for rash.  Neurological: Positive for weakness.  Hematological: Negative for adenopathy.  Psychiatric/Behavioral: Negative for confusion.     Physical Exam Updated Vital Signs BP 90/64 (BP Location: Left Arm)   Pulse 86   Temp 97.8 F (36.6 C) (Oral)   Resp 20   Ht 6\' 2"  (1.88 m)   Wt 93.6 kg (206 lb 7 oz)   SpO2 100%   BMI 26.51 kg/m   Physical Exam    Constitutional: He appears well-developed.  HENT:  Head: Normocephalic.  Eyes: Pupils are equal, round, and reactive to light.  Neck: No JVD present.  Cardiovascular: Regular rhythm.  Pulmonary/Chest: Effort normal. He has no wheezes. He has no rales.  Abdominal: Soft. There is tenderness.  Epigastric and RUq tenderness without rebound or guarding.  Musculoskeletal:       Right lower leg: He exhibits no edema.       Left lower leg: He exhibits no edema.  Neurological: He is alert. He is not disoriented.  Skin: Skin is warm. Capillary refill takes less than 2 seconds.     ED Treatments / Results  Labs (all labs ordered are listed, but only abnormal results are displayed) Labs Reviewed  BASIC METABOLIC PANEL - Abnormal; Notable for the following components:      Result Value   Sodium 133 (*)    Chloride 100 (*)    Glucose, Bld 117 (*)    BUN 33 (*)    Creatinine, Ser 1.49 (*)    Calcium 8.7 (*)    GFR calc non Af Amer 55 (*)    All other components within normal limits  CBC - Abnormal; Notable for the following components:   RBC 3.95 (*)    Hemoglobin 11.4 (*)    HCT 35.8 (*)    All other components within normal limits  TROPONIN I - Abnormal; Notable for the following components:   Troponin I 0.04 (*)    All other components within normal limits  HEPATIC FUNCTION PANEL - Abnormal; Notable for the following components:   Albumin 3.2 (*)    AST 224 (*)    ALT 288 (*)    Total Bilirubin 1.7 (*)    Indirect Bilirubin 1.2 (*)    All other components within normal limits  BRAIN NATRIURETIC PEPTIDE - Abnormal; Notable for the following components:   B Natriuretic Peptide 2,510.0 (*)    All other components within normal limits  LIPASE, BLOOD            EKG EKG Interpretation  Date/Time:  Thursday August 25 2017 12:59:50 EDT Ventricular Rate:  86 PR Interval:  172 QRS Duration: 90 QT Interval:  424 QTC Calculation: 507 R Axis:   24 Text Interpretation:   Sinus rhythm with occasional Premature ventricular complexes Possible Left atrial enlargement Septal infarct , age undetermined Prolonged QT Abnormal ECG Confirmed by Benjiman Core [phone]) on 08/25/2017 1:11:29 PM   Radiology Dg Chest 2 View  Result Date: 08/25/2017 CLINICAL DATA:  Chest pain EXAM: CHEST - 2 VIEW COMPARISON:  August 15, 2017 FINDINGS: There is mild perihilar scarring on the left. There is no edema or consolidation. Heart is mildly enlarged, stable, with pulmonary vascularity within normal limits. No  adenopathy. No bone lesions. IMPRESSION: Stable mild cardiomegaly. No edema or consolidation. Mild left-sided perihilar scarring is stable. Electronically Signed   By: Bretta Bang III M.D.   On: 08/25/2017 13:24    Procedures Procedures (including critical care time)  Medications Ordered in ED Medications - No data to display   Initial Impression / Assessment and Plan / ED Course  I have reviewed the triage vital signs and the nursing notes.  Pertinent labs & imaging results that were available during my care of the patient were reviewed by me and considered in my medical decision making (see chart for details).     Patient presents with abdominal pain.  Has had decreased oral intake.  LFTs are elevated.  Troponin also mildly elevated but near baseline.  BNP also elevated.  Has some hypotension.  Weight is down compared to his weight at [facility]  Reviewed labs and notes from [place].  Had a negative HIDA scan and reassuring CT scan.  Can see these through [facility].  However with continued shortness of breath and abdominal pain I think patient would potentially benefit from admission.  Will discuss with hospitalist.  Final Clinical Impressions(s) / ED Diagnoses   Final diagnoses:  Epigastric pain  Dyspnea, unspecified type    ED Discharge Orders    None       Benjiman Core, MD 08/25/17 1521

## 2017-08-25 NOTE — Progress Notes (Deleted)
MD notified of pt not voiding and of bladder scan results. New order for foley received

## 2017-08-25 NOTE — ED Notes (Signed)
CRITICAL VALUE ALERT  Critical Value:  Troponin 0.04  Date & Time Notied:  08/25/17 1437  Provider Notified: Dr. Rubin Payor   Orders Received/Actions taken: EDP notified, no further orders given

## 2017-08-25 NOTE — ED Triage Notes (Signed)
Patient reports CHF, CP, and abdominal pain that started 3-4 days ago. Has been seen at [facility] x 3. Last visit was yesterday.

## 2017-08-26 ENCOUNTER — Inpatient Hospital Stay (HOSPITAL_COMMUNITY): Payer: Medicaid Other

## 2017-08-26 ENCOUNTER — Other Ambulatory Visit: Payer: Self-pay

## 2017-08-26 ENCOUNTER — Encounter (HOSPITAL_COMMUNITY): Payer: Self-pay | Admitting: Student

## 2017-08-26 DIAGNOSIS — I340 Nonrheumatic mitral (valve) insufficiency: Secondary | ICD-10-CM

## 2017-08-26 DIAGNOSIS — I428 Other cardiomyopathies: Secondary | ICD-10-CM

## 2017-08-26 LAB — COOXEMETRY PANEL
Carboxyhemoglobin: 3.1 % — ABNORMAL HIGH (ref 0.5–1.5)
Carboxyhemoglobin: 1.2 % (ref 0.5–1.5)
Methemoglobin: 0.6 % (ref 0.0–1.5)
Methemoglobin: 0.7 % (ref 0.0–1.5)
O2 SAT: 97.6 %
O2 Saturation: 44.2 %
TOTAL HEMOGLOBIN: 11.2 g/dL — AB (ref 12.0–16.0)
TOTAL HEMOGLOBIN: 11.2 g/dL — AB (ref 12.0–16.0)

## 2017-08-26 LAB — COMPREHENSIVE METABOLIC PANEL
ALT: 270 U/L — ABNORMAL HIGH (ref 17–63)
ANION GAP: 10 (ref 5–15)
AST: 181 U/L — ABNORMAL HIGH (ref 15–41)
Albumin: 3.2 g/dL — ABNORMAL LOW (ref 3.5–5.0)
Alkaline Phosphatase: 96 U/L (ref 38–126)
BUN: 37 mg/dL — ABNORMAL HIGH (ref 6–20)
CALCIUM: 8.5 mg/dL — AB (ref 8.9–10.3)
CHLORIDE: 102 mmol/L (ref 101–111)
CO2: 22 mmol/L (ref 22–32)
Creatinine, Ser: 1.52 mg/dL — ABNORMAL HIGH (ref 0.61–1.24)
GFR, EST NON AFRICAN AMERICAN: 54 mL/min — AB (ref >60)
Glucose, Bld: 122 mg/dL — ABNORMAL HIGH (ref 65–99)
Potassium: 3.7 mmol/L (ref 3.5–5.1)
Sodium: 134 mmol/L — ABNORMAL LOW (ref 135–145)
Total Bilirubin: 1.4 mg/dL — ABNORMAL HIGH (ref 0.3–1.2)
Total Protein: 7 g/dL (ref 6.5–8.1)

## 2017-08-26 LAB — TROPONIN I
TROPONIN I: 0.05 ng/mL — AB (ref <0.03)
Troponin I: 0.05 ng/mL (ref <0.03)

## 2017-08-26 LAB — ECHOCARDIOGRAM COMPLETE
Height: 74 in
WEIGHTICAEL: 3280.44 [oz_av]

## 2017-08-26 LAB — MRSA PCR SCREENING: MRSA by PCR: NEGATIVE

## 2017-08-26 MED ORDER — LIVING BETTER WITH HEART FAILURE BOOK: Freq: Once | Status: DC

## 2017-08-26 MED ORDER — ALPRAZOLAM 0.25 MG PO TABS
0.2500 mg | ORAL_TABLET | Freq: Once | ORAL | Status: AC
  Administered 2017-08-26: 0.25 mg via ORAL
  Filled 2017-08-26: qty 1

## 2017-08-26 MED ORDER — FUROSEMIDE 10 MG/ML IJ SOLN
INTRAMUSCULAR | Status: AC
  Filled 2017-08-26: qty 30

## 2017-08-26 MED ORDER — METOLAZONE 5 MG PO TABS
5.0000 mg | ORAL_TABLET | Freq: Once | ORAL | Status: AC
  Administered 2017-08-26: 5 mg via ORAL
  Filled 2017-08-26: qty 1

## 2017-08-26 MED ORDER — SODIUM CHLORIDE 0.9% FLUSH
10.0000 mL | Freq: Two times a day (BID) | INTRAVENOUS | Status: DC
  Administered 2017-08-26: 30 mL
  Administered 2017-08-27 – 2017-09-04 (×13): 10 mL

## 2017-08-26 MED ORDER — MILRINONE LACTATE IN DEXTROSE 20-5 MG/100ML-% IV SOLN
0.2500 ug/kg/min | INTRAVENOUS | Status: DC
  Administered 2017-08-26: 0.125 ug/kg/min via INTRAVENOUS
  Administered 2017-08-27: 0.25 ug/kg/min via INTRAVENOUS
  Filled 2017-08-26 (×5): qty 100

## 2017-08-26 MED ORDER — SODIUM CHLORIDE 0.9% FLUSH: 10.0000 mL | INTRAVENOUS | Status: DC | PRN

## 2017-08-26 MED ORDER — GUAIFENESIN-DM 100-10 MG/5ML PO SYRP
5.0000 mL | ORAL_SOLUTION | ORAL | Status: DC | PRN
  Administered 2017-08-26 – 2017-08-29 (×4): 5 mL via ORAL
  Filled 2017-08-26 (×4): qty 5

## 2017-08-26 MED ORDER — CHLORHEXIDINE GLUCONATE CLOTH 2 % EX PADS
6.0000 | MEDICATED_PAD | Freq: Every day | CUTANEOUS | Status: DC
  Administered 2017-08-26 – 2017-09-04 (×8): 6 via TOPICAL

## 2017-08-26 NOTE — Progress Notes (Signed)
Jonathan Wood  ZOX:[id number] DOB: January 11, 1971 DOA: 08/25/2017 PCP: Patient, No Pcp Per   Brief Narrative:   Jonathan Wood is a 47 y.o. 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 Algie Coffer in [place].  The patient reports compliance of medications.  Over the past 4-5 days, he has had development of right upper quadrant pain, shortness of breath and cough.  He has also noted over a 10 pound weight gain at home with reported minimal urine output with Lasix over the last week.  He was noted to have elevated liver function tests and was admitted with what appears to be hepatic congestion secondary to acute on chronic systolic CHF decompensation.  He is currently in stepdown unit on Lasix drip with minimal urine output noted this far.  His Coreg and Sherryll Burger are currently withheld.  He has been evaluated by cardiology with 2D echocardiogram pending.  He is currently awaiting transfer to [facility] to be evaluated by his cardiologist Dr. Algie Coffer.   Assessment & Plan:   Active Problems:   Congestive dilated cardiomyopathy (HCC)   Acute on chronic systolic CHF (congestive heart failure) (HCC)   Elevated LFTs   Hepatic congestion   RUQ pain   Acute on chronic systolic (congestive) heart failure (HCC)   1. Acute on chronic systolic CHF decompensation in the setting of nonischemic cardiomyopathy with severe LV dysfunction.  Continue on IV Lasix drip and monitor urine output.  Appreciate cardiology recommendations with placement of PICC line for potential inotrope support as well as initiation of metolazone.  Continue to monitor closely while awaiting transfer to [facility] under Dr. Roseanne Kaufman service.  2D echocardiogram ordered and currently pending.  Troponin trend has been flat. 2. Transaminitis likely secondary to above with hepatic congestion.  LFTs are currently downtrending.  Clinically he  appears to be improved.  Continue to monitor with repeat CMP in a.m.  Abdominal CT and HIDA scan performed recently at [facility] him with no acute findings noted.   DVT prophylaxis: Lovenox Code Status: Full Family Communication: None at bedside Disposition Plan: Continue diuresis with possible need for inotropes per cardiology.  Awaiting transfer to [facility] pending bed availability in stepdown unit.   Consultants:   Cardiology Dr. Wyline Mood  Procedures:   None  Antimicrobials:   None   Subjective: Patient seen and evaluated today with no new acute complaints or concerns. No acute concerns or events noted overnight.  He has had minimal urine output with IV Lasix drip noted thus far.  His blood pressure remains stable.  He states that his abdominal pain has slightly improved.  Objective: Vitals:   08/26/17 0500 08/26/17 0600 08/26/17 0759 08/26/17 0800  BP:  93/79  96/73  Pulse: 92 91  95  Resp: 13 (!) 0  (!) 37  Temp: 98.7 F (37.1 C)  97.9 F (36.6 C)   TempSrc: Oral  Oral   SpO2: 100% 100%  98%  Weight: 93 kg (205 lb 0.4 oz)     Height:        Intake/Output Summary (Last 24 hours) at 08/26/2017 1053 Last data filed at 08/26/2017 0953 Gross per 24 hour  Intake 1139.27 ml  Output 350 ml  Net 789.27 ml   Filed Weights   08/25/17 1256 08/25/17 1405 08/26/17 0500  Weight: 95.7 kg (211 lb) 93.6 kg (206 lb 7 oz) 93 kg (205 lb 0.4 oz)  Examination:  General exam: Appears calm and comfortable  Respiratory system: Clear to auscultation. Respiratory effort normal. Currently on 2L [place]. Cardiovascular system: S1 & S2 heard, RRR. No JVD, murmurs, rubs, gallops or clicks. No pedal edema. Gastrointestinal system: Abdomen is nondistended, soft and nontender. No organomegaly or masses felt. Normal bowel sounds heard. Central nervous system: Alert and oriented. No focal neurological deficits. Extremities: Symmetric 5 x 5 power. Skin: No rashes, lesions or  ulcers Psychiatry: Judgement and insight appear normal. Mood & affect appropriate.     Data Reviewed: I have personally reviewed following labs and imaging studies  CBC: Recent Labs  Lab 08/25/17 1258  WBC 8.0  HGB 11.4*  HCT 35.8*  MCV 90.6  PLT 174   Basic Metabolic Panel: Recent Labs  Lab 08/25/17 1258 08/26/17 0347  NA 133* 134*  K 3.7 3.7  CL 100* 102  CO2 22 22  GLUCOSE 117* 122*  BUN 33* 37*  CREATININE 1.49* 1.52*  CALCIUM 8.7* 8.5*   GFR: Estimated Creatinine Clearance: 72.1 mL/min (A) (by C-G formula based on SCr of 1.52 mg/dL (H)). Liver Function Tests: Recent Labs  Lab 08/25/17 1337 08/26/17 0347  AST 224* 181*  ALT 288* 270*  ALKPHOS 92 96  BILITOT 1.7* 1.4*  PROT 6.5 7.0  ALBUMIN 3.2* 3.2*   Recent Labs  Lab 08/25/17 1337  LIPASE 25   No results for input(s): AMMONIA in the last 168 hours. Coagulation Profile: No results for input(s): INR, PROTIME in the last 168 hours. Cardiac Enzymes: Recent Labs  Lab 08/25/17 1258 08/25/17 2231 08/26/17 0347 08/26/17 0859  TROPONINI 0.04* 0.05* 0.05* 0.05*   BNP (last 3 results) No results for input(s): PROBNP in the last 8760 hours. HbA1C: No results for input(s): HGBA1C in the last 72 hours. CBG: No results for input(s): GLUCAP in the last 168 hours. Lipid Profile: No results for input(s): CHOL, HDL, LDLCALC, TRIG, CHOLHDL, LDLDIRECT in the last 72 hours. Thyroid Function Tests: No results for input(s): TSH, T4TOTAL, FREET4, T3FREE, THYROIDAB in the last 72 hours. Anemia Panel: No results for input(s): VITAMINB12, FOLATE, FERRITIN, TIBC, IRON, RETICCTPCT in the last 72 hours. Sepsis Labs: No results for input(s): PROCALCITON, LATICACIDVEN in the last 168 hours.  Recent Results (from the past 240 hour(s))  MRSA PCR Screening     Status: None   Collection Time: 08/25/17  9:35 PM  Result Value Ref Range Status   MRSA by PCR NEGATIVE NEGATIVE Final    Comment:        The GeneXpert MRSA  Assay (FDA approved for NASAL specimens only), is one component of a comprehensive MRSA colonization surveillance program. It is not intended to diagnose MRSA infection nor to guide or monitor treatment for MRSA infections. Performed at [facility], [address] [place], [place] [address]          Radiology Studies: Dg Chest 2 View  Result Date: 08/25/2017 CLINICAL DATA:  Chest pain EXAM: CHEST - 2 VIEW COMPARISON:  August 15, 2017 FINDINGS: There is mild perihilar scarring on the left. There is no edema or consolidation. Heart is mildly enlarged, stable, with pulmonary vascularity within normal limits. No adenopathy. No bone lesions. IMPRESSION: Stable mild cardiomegaly. No edema or consolidation. Mild left-sided perihilar scarring is stable. Electronically Signed   By: Bretta Bang III M.D.   On: 08/25/2017 13:24        Scheduled Meds: . aspirin EC  81 mg Oral Daily  . enoxaparin (LOVENOX) injection  40 mg Subcutaneous Q24H  . Living Better with Heart Failure Book   Does not apply Once  . potassium chloride  40 mEq Oral BID  . sodium chloride flush  3 mL Intravenous Q12H  . traZODone  50 mg Oral QHS   Continuous Infusions: . sodium chloride    . furosemide (LASIX) infusion 8 mg/hr (08/25/17 2258)     LOS: 1 day    Time spent: 30 minutes    Maris Abascal Hoover Brunette, DO [facility] Pager [phone]  If 7PM-7AM, please contact night-coverage [email or web address] Password [facility] ([facility] 08/26/2017, 10:53 AM

## 2017-08-26 NOTE — Progress Notes (Addendum)
Echo read. His prior echo from [place] last year is not in our system, from notes LVEF reported LVEF 25-30%. Echo today shows LVEF 5-10% along with moderate to severe RV failure. Evidence continues to support his presentation is due to low flow CHF. Awaiting PICC line and co-ox, if significant delay and poor diuresis in the morning/early afternoon will go ahead and start milrinone at 0.125 mcg/kg/min x 3 hours, and if tolerated titrate up to 0.25 mg/kg/min, or if co-ox back adjust dosing based on that.   Serum creatinine: 1.52 mg/dL (H) 70/62/37 6283 Estimated creatinine clearance: 72.1 mL/min (A)   Dina Rich MD  Addendum 1210pm Patient has diuresed nearly already this morning, appears more comfortable. Hold on inotropes for now   Dina Rich MD

## 2017-08-26 NOTE — Consult Note (Addendum)
Cardiology Consult    Patient ID: Jonathan Wood; [id number]; 03-28-1971   Admit date: 08/25/2017 Date of Consult: 08/26/2017  Primary Care Provider: Patient, No Pcp Per Primary Cardiologist: Dr. Algie Coffer   Patient Profile    Jonathan Wood is a 47 y.o. male with past medical history of chronic systolic CHF (EF 40-98% by cath in 09/2016), nonischemic cardiomyopathy, and GERD who is being seen today for the evaluation of CHF at the request of Dr. Kerry Hough.   History of Present Illness    Jonathan Wood has been followed by Dr. Algie Coffer in [place] and underwent a cardiac catheterization in 09/2016 which showed normal cors. He was started on Coreg 6.25mg  BID and Entresto 24-26mg  BID at the time of discharge.   He presented to [facility] ED on 08/25/2017 for evaluation of abdominal pain over the past 3-4 days. Has been evaluated at [facility] multiple times for similar complaints and CT Imaging showing no acute findings and HIDA scan was negative. In talking with the patient, he reports having worsening dyspnea on exertion and orthopnea over the past two weeks. Has noticed over a 10 lb weight gain on his home scales and reports minimal urine output with Lasix over the past week. Denies missing any doses of Lasix, Entresto, or Coreg. He has noticed RUQ pain which led to his multiple evaluations at [facility]. Does note associated constipation and nausea but says this has improved. Previously smoked 5-6 cigarettes per day but quit several weeks ago.   Initial labs show WBC 8.0, Hgb 11.4, platelets 174, Na+ 133, K+ 3.7, and creatinine 1.49 (baseline 1.1 - 1.2). AST 224. ALT 288. Lipase 25. BNP elevated to 2510. Initial and cyclic troponin values have been flat at 0.04 and 0.05. CXR shows stable cardiomegaly with no edema or consolidation.   He has been started on IV Lasix drip at 8mg /hr with minimal recorded urine output, however weight has declined by 1 pound. Reports baseline weight  of 198 - 199 lbs (up to 211 lbs on his home scales PTA and 206 lbs in hospital scales at time of admission). Creatinine trending up slightly to 1.52 this AM. BP has been soft at 81/49 - 105/90 with PTA Coreg and Entresto currently held.    Past Medical History:  Diagnosis Date  . Asthma   . CHF (congestive heart failure) (HCC)    a. 09/2016: EF 20-25% with cath showing normal cors  . GERD (gastroesophageal reflux disease)   . History of hiatal hernia     Past Surgical History:  Procedure Laterality Date  . NASAL FRACTURE SURGERY  1987  . RIGHT/LEFT HEART CATH AND CORONARY ANGIOGRAPHY N/A 10/08/2016   Procedure: Right/Left Heart Cath and Coronary Angiography;  Surgeon: Orpah Cobb, MD;  Location: [facility] INVASIVE CV LAB;  Service: Cardiovascular;  Laterality: N/A;     Home Medications:  Prior to Admission medications   Medication Sig Start Date End Date Taking? Authorizing Provider  albuterol (PROVENTIL HFA;VENTOLIN HFA) 108 (90 Base) MCG/ACT inhaler Inhale into the lungs every 6 (six) hours as needed for wheezing or shortness of breath.   Yes [provider]  albuterol (PROVENTIL) (2.5 MG/3ML) 0.083% nebulizer solution Take 3 mLs (2.5 mg total) by nebulization every 6 (six) hours as needed for wheezing or shortness of breath. 01/28/17  Yes Vanetta Mulders, MD  aspirin EC 81 MG tablet Take 81 mg by mouth daily.   Yes [provider]  carvedilol (COREG) 6.25 MG tablet Take  1 tablet (6.25 mg total) by mouth 2 (two) times daily with a meal. 10/10/16  Yes Orpah Cobb, MD  furosemide (LASIX) 80 MG tablet Take 2,400 mg by mouth 2 (two) times daily.   Yes [provider]  sacubitril-valsartan (ENTRESTO) 24-26 MG Take 1 tablet by mouth 2 (two) times daily. 10/10/16  Yes Orpah Cobb, MD  traZODone (DESYREL) 50 MG tablet Take 50 mg by mouth at bedtime.   Yes [provider]    Inpatient Medications: Scheduled Meds: . aspirin EC  81 mg Oral Daily  . enoxaparin  (LOVENOX) injection  40 mg Subcutaneous Q24H  . Living Better with Heart Failure Book   Does not apply Once  . potassium chloride  40 mEq Oral BID  . sodium chloride flush  3 mL Intravenous Q12H  . traZODone  50 mg Oral QHS   Continuous Infusions: . sodium chloride    . furosemide (LASIX) infusion 8 mg/hr (08/25/17 2258)   PRN Meds: sodium chloride, acetaminophen, albuterol, guaiFENesin-dextromethorphan, ondansetron (ZOFRAN) IV, sodium chloride flush  Allergies:   No Known Allergies  Social History:   Social History   Socioeconomic History  . Marital status: Legally Separated    Spouse name: Not on file  . Number of children: Not on file  . Years of education: Not on file  . Highest education level: Not on file  Occupational History  . Not on file  Social Needs  . Financial resource strain: Not on file  . Food insecurity:    Worry: Not on file    Inability: Not on file  . Transportation needs:    Medical: Not on file    Non-medical: Not on file  Tobacco Use  . Smoking status: Current Every Day Smoker    Packs/day: 0.50    Years: 25.00    Pack years: 12.50    Types: Cigarettes  . Smokeless tobacco: Never Used  . Tobacco comment: quit 1 week  Substance and Sexual Activity  . Alcohol use: Yes    Comment: occas  . Drug use: No  . Sexual activity: Yes  Lifestyle  . Physical activity:    Days per week: Not on file    Minutes per session: Not on file  . Stress: Not on file  Relationships  . Social connections:    Talks on phone: Not on file    Gets together: Not on file    Attends religious service: Not on file    Active member of club or organization: Not on file    Attends meetings of clubs or organizations: Not on file    Relationship status: Not on file  . Intimate partner violence:    Fear of current or ex partner: Not on file    Emotionally abused: Not on file    Physically abused: Not on file    Forced sexual activity: Not on file  Other Topics Concern   . Not on file  Social History Narrative  . Not on file     Family History:    Family History  Problem Relation Age of Onset  . Hypertension Father       Review of Systems    General:  No chills, fever, night sweats or weight changes.  Cardiovascular:  No chest pain,  palpitations, paroxysmal nocturnal dyspnea. Positive for edema, dyspnea on exertion, and orthopnea.  Dermatological: No rash, lesions/masses Respiratory: No cough, dyspnea Urologic: No hematuria, dysuria Abdominal:   No nausea, vomiting, diarrhea, bright red  blood per rectum, melena, or hematemesis. Positive for abdominal pain.  Neurologic:  No visual changes, wkns, changes in mental status. All other systems reviewed and are otherwise negative except as noted above.  Physical Exam/Data    Vitals:   08/26/17 0500 08/26/17 0600 08/26/17 0759 08/26/17 0800  BP:  93/79  96/73  Pulse: 92 91  95  Resp: 13 (!) 0  (!) 37  Temp: 98.7 F (37.1 C)  97.9 F (36.6 C)   TempSrc: Oral  Oral   SpO2: 100% 100%  98%  Weight: 205 lb 0.4 oz (93 kg)     Height:        Intake/Output Summary (Last 24 hours) at 08/26/2017 0835 Last data filed at 08/26/2017 0759 Gross per 24 hour  Intake 776.27 ml  Output 350 ml  Net 426.27 ml   Filed Weights   08/25/17 1256 08/25/17 1405 08/26/17 0500  Weight: 211 lb (95.7 kg) 206 lb 7 oz (93.6 kg) 205 lb 0.4 oz (93 kg)   Body mass index is 26.32 kg/m.   General: Pleasant, African American male appearing in NAD Psych: Normal affect. Neuro: Alert and oriented X 3. Moves all extremities spontaneously. HEENT: Normal  Neck: Supple without bruits. JVD elevated to 11cm. Lungs:  Resp regular and unlabored, decreased breath sounds along bases bilaterally. Heart: RRR no s3, s4, or murmurs. Abdomen: Soft, non-tender, non-distended, BS + x 4.  Extremities: No clubbing or cyanosis. 1+ pitting edema bilaterally. DP/PT/Radials 2+ and equal bilaterally.   EKG:  The EKG was personally reviewed  and demonstrates: NSR, HR 86, with PAC's and atrial enlargement.   Labs/Studies     Relevant CV Studies:  Cardiac Catheterization: 10/08/2016  Hemodynamic findings consistent with mild pulmonary hypertension.  Normal coronaries   Medical treatment.  Echocardiogram: Pending  Laboratory Data:  Chemistry Recent Labs  Lab 08/25/17 1258 08/26/17 0347  NA 133* 134*  K 3.7 3.7  CL 100* 102  CO2 22 22  GLUCOSE 117* 122*  BUN 33* 37*  CREATININE 1.49* 1.52*  CALCIUM 8.7* 8.5*  GFRNONAA 55* 54*  GFRAA >60 >60  ANIONGAP 11 10    Recent Labs  Lab 08/25/17 1337 08/26/17 0347  PROT 6.5 7.0  ALBUMIN 3.2* 3.2*  AST 224* 181*  ALT 288* 270*  ALKPHOS 92 96  BILITOT 1.7* 1.4*   Hematology Recent Labs  Lab 08/25/17 1258  WBC 8.0  RBC 3.95*  HGB 11.4*  HCT 35.8*  MCV 90.6  MCH 28.9  MCHC 31.8  RDW 13.6  PLT 174   Cardiac Enzymes Recent Labs  Lab 08/25/17 1258 08/25/17 2231 08/26/17 0347  TROPONINI 0.04* 0.05* 0.05*   No results for input(s): TROPIPOC in the last 168 hours.  BNP Recent Labs  Lab 08/25/17 1337  BNP 2,510.0*    DDimer No results for input(s): DDIMER in the last 168 hours.  Radiology/Studies:  Dg Chest 2 View  Result Date: 08/25/2017 CLINICAL DATA:  Chest pain EXAM: CHEST - 2 VIEW COMPARISON:  August 15, 2017 FINDINGS: There is mild perihilar scarring on the left. There is no edema or consolidation. Heart is mildly enlarged, stable, with pulmonary vascularity within normal limits. No adenopathy. No bone lesions. IMPRESSION: Stable mild cardiomegaly. No edema or consolidation. Mild left-sided perihilar scarring is stable. Electronically Signed   By: Bretta Bang III M.D.   On: 08/25/2017 13:24    Assessment & Plan    1. Acute on Chronic Systolic/ Nonischemic Cardiomyopathy - the patient has  a known reduced EF of 20-25% by cath in 09/2016. Presents with worsening dyspnea on exertion, orthopnea, and lower extremity edema over the past two  weeks with minimal urine output with PTA Lasix. Also notes RUQ pain and abdominal distension.  - BNP elevated to 2510. Initial and cyclic troponin values have been flat at 0.04 and 0.05. CXR shows stable cardiomegaly with no edema or consolidation. Echocardiogram is pending to assess LV function and wall motion. Started on IV Lasix drip at 8mg /hr with minimal recorded urine output, however weight has declined by 1 pound. Baseline weight of 198 - 199 lbs (currently at 205 lbs). BB and Entresto held due to hypotension. To be transferred to [facility] later today to Dr. Roseanne Kaufman service. Would recommend Advanced Heart Failure consult. Will place PICC and check Co-ox. Continue with Lasix drip.   2. RUQ Pain/ Transaminitis - AST 224 and ALT 288 on admission, thought to be secondary to hepatic congestion as recent Abdominal CT and HIDA scan at [facility] showed no acute findings.  - continue to follow LFT's.    For questions or updates, please contact [facility] Please consult [email or web address] for contact info under Cardiology/STEMI.  Signed, Ellsworth Lennox, PA-C 08/26/2017, 8:35 AM Pager: [phone]   Patient seen and discussed with PA Iran Ouch, I agree with her documentation above. 47 yo male history of NICM LVEF 20-25% admitted with RUQ pain and SOB. Reports home weights trending up. Notes mention extensive GI workup at [facility] over the last few weeks with negatve CT abdomen, abd [place], and HIDA scan. He reports 11 lbs weight gain over the last 6 days with worsenign SOB, abdominal pain, cough.  K 3.7 Cr 1.49 BUN 33 WBC 8 Hgb 11.4 Plt 174 AST 224 ALT 288 T.bili 1.7 Lipase 25 BNP 2500  Trop 0.04--> 0.05-->0.05  CXR no acute process Echo pending 09/2016 LHC/RHC normal coronaries, RHC CI 1.7, mean PA 35, PCWP 31   Patient with acute on chronic systolic HF. 11 lbs weight gain at home. His RHC from 09/2016 showed low CI 1.7, his primary issue may be low flow CHF. This is supported by  his newly soft bp's, elevated LFTs, RUQ pain like hepatic congestion, worsening renal function with diuretic attempts despite volume overload. He is on lasix gtt at 8 mg/hr, uop not documented overnight. We will place PICC line for central line venous O2 monitoring(co-ox panel) and CVP monitoring, pending results consider starting inotropes. Continue lasix drip today, dose metolazone 5mg  today. F/u repeat echo. His home CHF meds are on hold due to his soft bp's. Patient awaiting transfer to cardiology service at [facility] Dr Algie Coffer, we will manage him while he remains at [facility]. If significant delay in PICC line likely will start inotroptes emperically   Dina Rich MD

## 2017-08-26 NOTE — Progress Notes (Signed)
Peripherally Inserted Central Catheter/Midline Placement  The IV Nurse has discussed with the patient and/or persons authorized to consent for the patient, the purpose of this procedure and the potential benefits and risks involved with this procedure.  The benefits include less needle sticks, lab draws from the catheter, and the patient may be discharged home with the catheter. Risks include, but not limited to, infection, bleeding, blood clot (thrombus formation), and puncture of an artery; nerve damage and irregular heartbeat and possibility to perform a PICC exchange if needed/ordered by physician.  Alternatives to this procedure were also discussed.  Bard Power PICC patient education guide, fact sheet on infection prevention and patient information card has been provided to patient /or left at bedside.    PICC/Midline Placement Documentation  PICC Double Lumen 08/26/17 PICC Right Basilic 45 cm 3 cm (Active)  Indication for Insertion or Continuance of Line Vasoactive infusions 08/26/2017  6:01 PM  Exposed Catheter (cm) 0 cm 08/26/2017  6:01 PM  Site Assessment Clean;Dry;Intact 08/26/2017  6:01 PM  Lumen #1 Status Flushed;Saline locked;Blood return noted 08/26/2017  6:01 PM  Lumen #2 Status Flushed;Saline locked;Blood return noted 08/26/2017  6:01 PM  Dressing Type Transparent 08/26/2017  6:01 PM  Dressing Status Clean;Dry;Intact;Antimicrobial disc in place 08/26/2017  6:01 PM  Dressing Change Due 09/02/17 08/26/2017  6:01 PM       Ethelda Chick 08/26/2017, 6:03 PM

## 2017-08-26 NOTE — Progress Notes (Signed)
*  PRELIMINARY RESULTS* Echocardiogram 2D Echocardiogram has been performed.  Jonathan Wood 08/26/2017, 11:17 AM

## 2017-08-26 NOTE — Progress Notes (Signed)
Md notified of pt request for xanax due to his "high level of anxiety". New order received and noted.

## 2017-08-26 NOTE — Progress Notes (Signed)
Note coox panel from 915AM was not drawn from central access, disregard findings. Will need to be repeated once PICC line is placed.    Dina Rich MD

## 2017-08-26 NOTE — Progress Notes (Signed)
CVP monitoring started. Initial reading of 20. Explained to pt what a CVP reading meant and what it is measuring. Pt stated he understood. Explained what milrinone is and why we were starting the medication on him. Pt verbalized understanding. Also explained to patient what an EF of 5-10% meant. Patient is very concerned and has many questions concerning prognosis. This RN stated that these are questions for his cardiologist. Pt is very SOB at rest. Still awaiting bed at [facility].   Genelle Bal, RN

## 2017-08-26 NOTE — Progress Notes (Signed)
Central line/PICC line  venous sat 44%. Poor diuresis during the day despite lasix drip and oral metolazone. Will start milrinone 0.125 mcg/kg/min, if tolerates x 3 hours uptitrate to 0.35mcg/kg/min. Co-ox ordered for AM along with CVP monitoring.    Dina Rich MD

## 2017-08-27 DIAGNOSIS — R570 Cardiogenic shock: Secondary | ICD-10-CM | POA: Diagnosis present

## 2017-08-27 DIAGNOSIS — N179 Acute kidney failure, unspecified: Secondary | ICD-10-CM

## 2017-08-27 LAB — CBC
HEMATOCRIT: 33.8 % — AB (ref 39.0–52.0)
Hemoglobin: 11 g/dL — ABNORMAL LOW (ref 13.0–17.0)
MCH: 28.7 pg (ref 26.0–34.0)
MCHC: 32.5 g/dL (ref 30.0–36.0)
MCV: 88.3 fL (ref 78.0–100.0)
PLATELETS: 145 10*3/uL — AB (ref 150–400)
RBC: 3.83 MIL/uL — AB (ref 4.22–5.81)
RDW: 13.6 % (ref 11.5–15.5)
WBC: 7.4 10*3/uL (ref 4.0–10.5)

## 2017-08-27 LAB — BASIC METABOLIC PANEL
ANION GAP: 9 (ref 5–15)
BUN: 32 mg/dL — ABNORMAL HIGH (ref 6–20)
CALCIUM: 8.6 mg/dL — AB (ref 8.9–10.3)
CO2: 25 mmol/L (ref 22–32)
CREATININE: 1.37 mg/dL — AB (ref 0.61–1.24)
Chloride: 96 mmol/L — ABNORMAL LOW (ref 101–111)
GFR calc non Af Amer: >60 mL/min (ref >60)
Glucose, Bld: 105 mg/dL — ABNORMAL HIGH (ref 65–99)
Potassium: 3.6 mmol/L (ref 3.5–5.1)
SODIUM: 130 mmol/L — AB (ref 135–145)

## 2017-08-27 LAB — MAGNESIUM: Magnesium: 2.1 mg/dL (ref 1.7–2.4)

## 2017-08-27 LAB — COOXEMETRY PANEL
Carboxyhemoglobin: 1.1 % (ref 0.5–1.5)
Carboxyhemoglobin: 1.5 % (ref 0.5–1.5)
METHEMOGLOBIN: 1.2 % (ref 0.0–1.5)
Methemoglobin: 0.5 % (ref 0.0–1.5)
O2 Saturation: 34.7 %
O2 Saturation: 57.5 %
TOTAL HEMOGLOBIN: 11 g/dL — AB (ref 12.0–16.0)
TOTAL HEMOGLOBIN: 10.5 g/dL — AB (ref 12.0–16.0)

## 2017-08-27 MED ORDER — FUROSEMIDE 10 MG/ML IJ SOLN
40.0000 mg | Freq: Two times a day (BID) | INTRAMUSCULAR | Status: DC
  Administered 2017-08-27 – 2017-08-30 (×6): 40 mg via INTRAVENOUS
  Filled 2017-08-27 (×6): qty 4

## 2017-08-27 MED ORDER — POLYETHYLENE GLYCOL 3350 17 G PO PACK
17.0000 g | PACK | Freq: Every day | ORAL | Status: DC
  Administered 2017-08-27 – 2017-09-02 (×4): 17 g via ORAL
  Filled 2017-08-27 (×7): qty 1

## 2017-08-27 MED ORDER — POTASSIUM CHLORIDE CRYS ER 20 MEQ PO TBCR
20.0000 meq | EXTENDED_RELEASE_TABLET | Freq: Two times a day (BID) | ORAL | Status: DC
  Administered 2017-08-27 – 2017-08-30 (×6): 20 meq via ORAL
  Filled 2017-08-27 (×6): qty 1

## 2017-08-27 NOTE — Progress Notes (Signed)
PO2 is below reportable range on COOX and is not being reported so total o2 content is not recorded.

## 2017-08-27 NOTE — Progress Notes (Signed)
NURSING PROGRESS NOTE  JASSON SCHWEIN [id number] Transfer Data: 08/27/2017 3:21 AM Attending Provider: Maurilio Lovely D, DO FIE:PPIRJJO, No Pcp Per Code Status: Full  Jonathan Wood is a 47 y.o. male patient transferred from [facility]  -No acute distress noted.  -No complaints of shortness of breath.  -No complaints of chest pain.   Cardiac Monitoring: Bedside monitoring in place. Cardiac monitor yields:normal sinus rhythm.  Last Documented Vital Signs: Blood pressure 98/76, pulse 93, temperature 98.2 F (36.8 C), temperature source Oral, resp. rate 19, height 6\' 2"  (1.88 m), weight 91.5 kg (201 lb 11.5 oz), SpO2 99 %.  IV Fluids:  IV in place, occlusive dsg intact without redness, IV cath upper arm right double lumen PICC, condition patent and no redness With Milrinone and lasix infusing via each port  Allergies:  Patient has no known allergies.  Past Medical History:   has a past medical history of Asthma, CHF (congestive heart failure) (HCC), GERD (gastroesophageal reflux disease), and History of hiatal hernia.  Past Surgical History:   has a past surgical history that includes Nasal fracture surgery (1987) and RIGHT/LEFT HEART CATH AND CORONARY ANGIOGRAPHY (N/A, 10/08/2016).  Social History:   reports that he has been smoking cigarettes.  He has a 12.50 pack-year smoking history. He has never used smokeless tobacco. He reports that he drinks alcohol. He reports that he does not use drugs.  Skin: clean, dry, and intact  Patient/Family orientated to room. Information packet given to patient/family. Admission inpatient armband information verified with patient/family to include name and date of birth and placed on patient arm. Side rails up x 2, fall assessment and education completed with patient/family. Patient/family able to verbalize understanding of risk associated with falls and verbalized understanding to call for assistance before getting out of bed. Call  light within reach. Patient/family able to voice and demonstrate understanding of unit orientation instructions.    Text paged Admission via Amion to make aware of patient's arrival to the unit. Awaiting a response. Will continue to evaluate and treat per MD orders.

## 2017-08-27 NOTE — Progress Notes (Addendum)
Progress Note  Patient Name: Jonathan Wood Date of Encounter: 08/27/2017  Primary Cardiologist: No primary care provider on file.   Subjective   Feeling much better.  No chest pain.  Breathing improving.  Inpatient Medications    Scheduled Meds: . aspirin EC  81 mg Oral Daily  . Chlorhexidine Gluconate Cloth  6 each Topical Daily  . enoxaparin (LOVENOX) injection  40 mg Subcutaneous Q24H  . Living Better with Heart Failure Book   Does not apply Once  . potassium chloride  40 mEq Oral BID  . sodium chloride flush  10-40 mL Intracatheter Q12H  . sodium chloride flush  3 mL Intravenous Q12H  . traZODone  50 mg Oral QHS   Continuous Infusions: . sodium chloride    . furosemide (LASIX) infusion 8 mg/hr (08/27/17 0228)  . milrinone 0.25 mcg/kg/min (08/26/17 2351)   PRN Meds: sodium chloride, acetaminophen, albuterol, guaiFENesin-dextromethorphan, ondansetron (ZOFRAN) IV, sodium chloride flush, sodium chloride flush   Vital Signs    Vitals:   08/27/17 0100 08/27/17 0230 08/27/17 0441 08/27/17 0809  BP: 101/74 98/76 98/69  103/65  Pulse: 95 93 95 88  Resp: (!) 28 19 (!) 32 (!) 26  Temp:  98.2 F (36.8 C) 98.6 F (37 C) 97.7 F (36.5 C)  TempSrc:  Oral Oral Oral  SpO2: 96% 99% 100% 96%  Weight:  201 lb 11.5 oz (91.5 kg)    Height:        Intake/Output Summary (Last 24 hours) at 08/27/2017 0955 Last data filed at 08/27/2017 0700 Gross per 24 hour  Intake 1502.91 ml  Output 4200 ml  Net -2697.09 ml   Filed Weights   08/25/17 1405 08/26/17 0500 08/27/17 0230  Weight: 206 lb 7 oz (93.6 kg) 205 lb 0.4 oz (93 kg) 201 lb 11.5 oz (91.5 kg)    Telemetry    Sinus rhythm.  PVCs.  - Personally Reviewed  ECG    n/a - Personally Reviewed  Physical Exam   VS:  BP 103/65 (BP Location: Left Arm)   Pulse 88   Temp 97.7 F (36.5 C) (Oral)   Resp (!) 26   Ht 6\' 2"  (1.88 m)   Wt 201 lb 11.5 oz (91.5 kg)   SpO2 96%   BMI 25.90 kg/m  , BMI Body mass index is 25.9  kg/m. GENERAL:  Well appearing.  No acute distress.  HEENT: Pupils equal round and reactive, fundi not visualized, oral mucosa unremarkable NECK:  + jugular venous distention, waveform within normal limits, carotid upstroke brisk and symmetric, no bruits LUNGS:  Clear to auscultation bilaterally HEART:  RRR.  PMI not displaced or sustained,S1 and S2 within normal limits, no S3, no S4, no clicks, no rubs, no murmurs ABD:  Flat, positive bowel sounds normal in frequency in pitch, no bruits, no rebound, no guarding, no midline pulsatile mass, no hepatomegaly, no splenomegaly EXT:  2 plus pulses throughout, no edema, no cyanosis no clubbing SKIN:  No rashes no nodules NEURO:  Cranial nerves II through XII grossly intact, motor grossly intact throughout PSYCH:  Cognitively intact, oriented to person place and time   Labs    Chemistry Recent Labs  Lab 08/25/17 1258 08/25/17 1337 08/26/17 0347  NA 133*  --  134*  K 3.7  --  3.7  CL 100*  --  102  CO2 22  --  22  GLUCOSE 117*  --  122*  BUN 33*  --  37*  CREATININE 1.49*  --  1.52*  CALCIUM 8.7*  --  8.5*  PROT  --  6.5 7.0  ALBUMIN  --  3.2* 3.2*  AST  --  224* 181*  ALT  --  288* 270*  ALKPHOS  --  92 96  BILITOT  --  1.7* 1.4*  GFRNONAA 55*  --  54*  GFRAA >60  --  >60  ANIONGAP 11  --  10     Hematology Recent Labs  Lab 08/25/17 1258 08/27/17 0845  WBC 8.0 7.4  RBC 3.95* 3.83*  HGB 11.4* 11.0*  HCT 35.8* 33.8*  MCV 90.6 88.3  MCH 28.9 28.7  MCHC 31.8 32.5  RDW 13.6 13.6  PLT 174 145*    Cardiac Enzymes Recent Labs  Lab 08/25/17 1258 08/25/17 2231 08/26/17 0347 08/26/17 0859  TROPONINI 0.04* 0.05* 0.05* 0.05*   No results for input(s): TROPIPOC in the last 168 hours.   BNP Recent Labs  Lab 08/25/17 1337  BNP 2,510.0*     DDimer No results for input(s): DDIMER in the last 168 hours.   Radiology    Dg Chest 2 View  Result Date: 08/25/2017 CLINICAL DATA:  Chest pain EXAM: CHEST - 2 VIEW  COMPARISON:  August 15, 2017 FINDINGS: There is mild perihilar scarring on the left. There is no edema or consolidation. Heart is mildly enlarged, stable, with pulmonary vascularity within normal limits. No adenopathy. No bone lesions. IMPRESSION: Stable mild cardiomegaly. No edema or consolidation. Mild left-sided perihilar scarring is stable. Electronically Signed   By: Bretta Bang III M.D.   On: 08/25/2017 13:24    Cardiac Studies   Echo 08/26/17: Study Conclusions  - Left ventricle: The cavity size was mildly to moderately dilated.   Wall thickness was normal. The estimated ejection fraction was in   the range of 5% to 10%. Diffuse hypokinesis. - Aortic valve: Valve area (VTI): 1.73 cm^2. Valve area (Vmax):   1.86 cm^2. - Mitral valve: There was mild regurgitation. - Left atrium: The atrium was moderately dilated. - Right ventricle: The ventricular septum is flattened in systole   consistent with RV pressure overload. The cavity size was mildly   dilated. Systolic function was moderately to severely reduced. - Right atrium: The atrium was moderately dilated. - Atrial septum: No defect or patent foramen ovale was identified. - Inferior vena cava: The vessel was dilated. The respirophasic   diameter changes were blunted (< 50%), consistent with elevated   central venous pressure. - Technically adequate study.Study Conclusions  - Left ventricle: The cavity size was mildly to moderately dilated.   Wall thickness was normal. The estimated ejection fraction was in   the range of 5% to 10%. Diffuse hypokinesis. - Aortic valve: Valve area (VTI): 1.73 cm^2. Valve area (Vmax):   1.86 cm^2. - Mitral valve: There was mild regurgitation. - Left atrium: The atrium was moderately dilated. - Right ventricle: The ventricular septum is flattened in systole   consistent with RV pressure overload. The cavity size was mildly   dilated. Systolic function was moderately to severely reduced. -  Right atrium: The atrium was moderately dilated. - Atrial septum: No defect or patent foramen ovale was identified. - Inferior vena cava: The vessel was dilated. The respirophasic   diameter changes were blunted (< 50%), consistent with elevated   central venous pressure. - Technically adequate study.  Patient Profile     47 y.o. male with chronic systolic and diastolic heart failure and GERD here with acute on chronic systolic  and diastolic heart failure.    Assessment & Plan    # Acute on chronic systolic and diastolic heart failure: Transferred from [facility] for worsening dyspnea and heart failure.  Left heart cath 09/2016 revealed normal coronaries.  Echo this admission revealed LVEF 5-10% down from 25-30% previously.  Started on milrinone 4/12.   He is net -2.6L in the last 24 hours.  Co-ox this AM improved to 57.5 from 44 yesterday.  CVP is 14.  Continue lasix drip and milrinone for now.  Like to thinkly switch to bolus dosing of lasix tomorrow.  Will ask HF team to see him this admission.  He needs a cardiac MRI once euvolemic and renal function stable.   # AKI: Creatinine was 1.5 yesterday. Will add on BMP for this AM.    Time spent: 45 minutes-Greater than 50% of this time was spent in counseling, explanation of diagnosis, planning of further management, and coordination of care.  For questions or updates, please contact [facility] Please consult [email or web address] for contact info under Cardiology/STEMI.      Signed, Chilton Si, MD  08/27/2017, 9:55 AM

## 2017-08-27 NOTE — Progress Notes (Signed)
Pt going to [facility] via [facility]. All of patient's belongings sent with patient. VS stable. Report given to [place], RN on 2C.

## 2017-08-27 NOTE — Consult Note (Signed)
Reason for Consult:acute on chronic decompensated systolic congestive heart failure Referring Physician:[facility] hospitalist  MAXINE HUYNH is an 47 y.o. male.  WNI:OEVOJJK is 47 year old male with past medical history significant for severe nonischemic dilated cardiomyopathy, hypertension, history of congestive heart failure secondary to depressed LV systolic function, history of EtOH abuse in the past, history of polysubstance abuse in the past, tobacco abuse, was transferred from [facility] because of progressive shortness of breath associated with right upper quadrant pain. Patient was noted to have elevated BNP and LFTs suggestive of suggestive of acute heart failure and shock liver. 2-D echo done at [facility] showed EF of 5-10% patient is transferred here for further management. Patient states he has been compliant to his medications I.e. Entresto and carvedilol and has been taking higher doses of Lasix.patient states he still drinks occasionally beer during the weekend, but used to drink heavily between age [age].patient denies excessive salty food intake. Denies PND orthopnea or leg swelling.  Past Medical History:  Diagnosis Date  . Asthma   . CHF (congestive heart failure) ([place])    a. 09/2016: EF 20-25% with cath showing normal cors  . GERD (gastroesophageal reflux disease)   . History of hiatal hernia     Past Surgical History:  Procedure Laterality Date  . NASAL FRACTURE SURGERY  1987  . RIGHT/LEFT HEART CATH AND CORONARY ANGIOGRAPHY N/A 10/08/2016   Procedure: Right/Left Heart Cath and Coronary Angiography;  Surgeon: Dixie Dials, MD;  Location: [place] [facility];  Service: Cardiovascular;  Laterality: N/A;    Family History  Problem Relation Age of Onset  . Hypertension Father     Social History:  reports that he has been smoking cigarettes.  He has a 12.50 pack-year smoking history. He has never used smokeless tobacco. He reports that he drinks alcohol. He  reports that he does not use drugs.  Allergies: No Known Allergies  Medications: I have reviewed the patient's current medications.  Results for orders placed or performed during the hospital encounter of 08/25/17 (from the past 48 hour(s))  Basic metabolic panel     Status: Abnormal   Collection Time: 08/25/17 12:58 PM  Result Value Ref Range   Sodium 133 (L) 135 - 145 mmol/L   Potassium 3.7 3.5 - 5.1 mmol/L   Chloride 100 (L) 101 - 111 mmol/L   CO2 22 22 - 32 mmol/L   Glucose, Bld 117 (H) 65 - 99 mg/dL   BUN 33 (H) 6 - 20 mg/dL   Creatinine, Ser 1.49 (H) 0.61 - 1.24 mg/dL   Calcium 8.7 (L) 8.9 - 10.3 mg/dL   GFR calc non Af Amer 55 (L) >60 mL/min   GFR calc Af Amer >60 >60 mL/min    Comment: (NOTE) The eGFR has been calculated using the CKD EPI equation. This calculation has not been validated in all clinical situations. eGFR's persistently <60 mL/min signify possible Chronic Kidney Disease.    Anion gap 11 5 - 15    Comment: Performed at [facility], [address], [place], [place] [address]  CBC     Status: Abnormal   Collection Time: 08/25/17 12:58 PM  Result Value Ref Range   WBC 8.0 4.0 - 10.5 K/uL   RBC 3.95 (L) 4.22 - 5.81 MIL/uL   Hemoglobin 11.4 (L) 13.0 - 17.0 g/dL   HCT 35.8 (L) 39.0 - 52.0 %   MCV 90.6 78.0 - 100.0 fL   MCH 28.9 26.0 - 34.0 pg  MCHC 31.8 30.0 - 36.0 g/dL   RDW 13.6 11.5 - 15.5 %   Platelets 174 150 - 400 K/uL    Comment: Performed at [facility], [address], [place], [place] [address]  Troponin I     Status: Abnormal   Collection Time: 08/25/17 12:58 PM  Result Value Ref Range   Troponin I 0.04 (HH) <0.03 ng/mL    Comment: CRITICAL RESULT CALLED TO, READ BACK BY AND VERIFIED WITH: WHITE,M AT 6314 ON 4.11.2019 BY ISLEY,B Performed at [facility], [address], [place], [place] [address]   Hepatic function panel     Status: Abnormal   Collection Time: 08/25/17  1:37 PM  Result Value Ref Range   Total Protein 6.5 6.5 - 8.1 g/dL    Albumin 3.2 (L) 3.5 - 5.0 g/dL   AST 224 (H) 15 - 41 U/L   ALT 288 (H) 17 - 63 U/L   Alkaline Phosphatase 92 38 - 126 U/L   Total Bilirubin 1.7 (H) 0.3 - 1.2 mg/dL   Bilirubin, Direct 0.5 0.1 - 0.5 mg/dL   Indirect Bilirubin 1.2 (H) 0.3 - 0.9 mg/dL    Comment: Performed at [facility], [address], [place], [place] [address]  Lipase, blood     Status: None   Collection Time: 08/25/17  1:37 PM  Result Value Ref Range   Lipase 25 11 - 51 U/L    Comment: Performed at [facility], [address] [place], [place] [address]  Brain natriuretic peptide     Status: Abnormal   Collection Time: 08/25/17  1:37 PM  Result Value Ref Range   B Natriuretic Peptide 2,510.0 (H) 0.0 - 100.0 pg/mL    Comment: Performed at [facility], [address], [place], [place] [address]  MRSA PCR Screening     Status: None   Collection Time: 08/25/17  9:35 PM  Result Value Ref Range   MRSA by PCR NEGATIVE NEGATIVE    Comment:        The GeneXpert MRSA Assay (FDA approved for NASAL specimens only), is one component of a comprehensive MRSA colonization surveillance program. It is not intended to diagnose MRSA infection nor to guide or monitor treatment for MRSA infections. Performed at [facility], [address], [place], [place] [address]   Troponin I     Status: Abnormal   Collection Time: 08/25/17 10:31 PM  Result Value Ref Range   Troponin I 0.05 (HH) <0.03 ng/mL    Comment: CRITICAL RESULT CALLED TO, READ BACK BY AND VERIFIED WITH: ACORD,B @ 2335 ON 4.11.19 BY BOWMAN,L Performed at [facility], [address], [place], [place] [address]   Troponin I     Status: Abnormal   Collection Time: 08/26/17  3:47 AM  Result Value Ref Range   Troponin I 0.05 (HH) <0.03 ng/mL    Comment: CRITICAL VALUE NOTED.  VALUE IS CONSISTENT WITH PREVIOUSLY REPORTED AND CALLED VALUE. Performed at [facility], [address] [place],  [address]   Comprehensive metabolic panel     Status: Abnormal    Collection Time: 08/26/17  3:47 AM  Result Value Ref Range   Sodium 134 (L) 135 - 145 mmol/L   Potassium 3.7 3.5 - 5.1 mmol/L   Chloride 102 101 - 111 mmol/L   CO2 22 22 - 32 mmol/L   Glucose, Bld 122 (H) 65 - 99 mg/dL   BUN 37 (H) 6 - 20 mg/dL   Creatinine, Ser 1.52 (H) 0.61 -  1.24 mg/dL   Calcium 8.5 (L) 8.9 - 10.3 mg/dL   Total Protein 7.0 6.5 - 8.1 g/dL   Albumin 3.2 (L) 3.5 - 5.0 g/dL   AST 181 (H) 15 - 41 U/L   ALT 270 (H) 17 - 63 U/L   Alkaline Phosphatase 96 38 - 126 U/L   Total Bilirubin 1.4 (H) 0.3 - 1.2 mg/dL   GFR calc non Af Amer 54 (L) >60 mL/min   GFR calc Af Amer >60 >60 mL/min    Comment: (NOTE) The eGFR has been calculated using the CKD EPI equation. This calculation has not been validated in all clinical situations. eGFR's persistently <60 mL/min signify possible Chronic Kidney Disease.    Anion gap 10 5 - 15    Comment: Performed at [facility], [address] [place], [place] [address]  Troponin I     Status: Abnormal   Collection Time: 08/26/17  8:59 AM  Result Value Ref Range   Troponin I 0.05 (HH) <0.03 ng/mL    Comment: CRITICAL VALUE NOTED.  VALUE IS CONSISTENT WITH PREVIOUSLY REPORTED AND CALLED VALUE. Performed at [facility], [address], [place], [place] [address]   .Cooxemetry Panel (carboxy, met, total hgb, O2 sat)     Status: Abnormal   Collection Time: 08/26/17  9:15 AM  Result Value Ref Range   Total hemoglobin 11.2 (L) 12.0 - 16.0 g/dL   O2 Saturation 97.6 %   Carboxyhemoglobin 3.1 (H) 0.5 - 1.5 %   Methemoglobin 0.6 0.0 - 1.5 %    Comment: Performed at [facility], [address] [place], [place] [address]  .Cooxemetry Panel (carboxy, met, total hgb, O2 sat)     Status: Abnormal   Collection Time: 08/26/17  6:50 PM  Result Value Ref Range   Total hemoglobin 11.2 (L) 12.0 - 16.0 g/dL   O2 Saturation 44.2 %   Carboxyhemoglobin 1.2 0.5 - 1.5 %   Methemoglobin 0.7 0.0 - 1.5 %    Comment: Performed at [facility], [address] [place], [place] [address]  Cooxemetry Panel (carboxy, met, total hgb, O2 sat)     Status: Abnormal   Collection Time: 08/26/17 11:25 PM  Result Value Ref Range   Total hemoglobin 11.0 (L) 12.0 - 16.0 g/dL   O2 Saturation 34.7 %   Carboxyhemoglobin 1.1 0.5 - 1.5 %   Methemoglobin 0.5 0.0 - 1.5 %    Comment: Performed at [facility], [address], [place], [place] [address]  .Cooxemetry Panel (carboxy, met, total hgb, O2 sat)     Status: Abnormal   Collection Time: 08/27/17  5:48 AM  Result Value Ref Range   Total hemoglobin 10.5 (L) 12.0 - 16.0 g/dL   O2 Saturation 57.5 %   Carboxyhemoglobin 1.5 0.5 - 1.5 %   Methemoglobin 1.2 0.0 - 1.5 %  CBC     Status: Abnormal   Collection Time: 08/27/17  8:45 AM  Result Value Ref Range   WBC 7.4 4.0 - 10.5 K/uL   RBC 3.83 (L) 4.22 - 5.81 MIL/uL   Hemoglobin 11.0 (L) 13.0 - 17.0 g/dL   HCT 33.8 (L) 39.0 - 52.0 %   MCV 88.3 78.0 - 100.0 fL   MCH 28.7 26.0 - 34.0 pg   MCHC 32.5 30.0 - 36.0 g/dL   RDW 13.6 11.5 - 15.5 %   Platelets 145 (L) 150 - 400 K/uL    Comment: Performed at [facility], [place]. [address],  [place], [place] [address]  Magnesium     Status: None   Collection Time: 08/27/17  8:45 AM  Result Value Ref Range   Magnesium 2.1 1.7 - 2.4 mg/dL    Comment: Performed at [facility] [address] [place], [place] [address]    Dg Chest 2 View  Result Date: 08/25/2017 CLINICAL DATA:  Chest pain EXAM: CHEST - 2 VIEW COMPARISON:  August 15, 2017 FINDINGS: There is mild perihilar scarring on the left. There is no edema or consolidation. Heart is mildly enlarged, stable, with pulmonary vascularity within normal limits. No adenopathy. No bone lesions. IMPRESSION: Stable mild cardiomegaly. No edema or consolidation. Mild left-sided perihilar scarring is stable. Electronically Signed   By: Lowella Grip III M.D.   On: 08/25/2017 13:24    Review of Systems  Constitutional: Negative for chills and fever.  HENT: Negative for  hearing loss.   Eyes: Negative for blurred vision.  Respiratory: Positive for cough and shortness of breath.   Cardiovascular: Negative for chest pain, orthopnea and leg swelling.  Gastrointestinal: Positive for abdominal pain. Negative for vomiting.  Genitourinary: Negative for dysuria.  Neurological: Negative for dizziness.   Blood pressure 101/75, pulse 95, temperature 98.7 F (37.1 C), temperature source Oral, resp. rate (!) 24, height '6\' 2"'$  (1.88 m), weight 91.5 kg (201 lb 11.5 oz), SpO2 95 %. Physical Exam  Constitutional: He is oriented to person, place, and time.  HENT:  Head: Normocephalic and atraumatic.  Eyes: Pupils are equal, round, and reactive to light. Conjunctivae are normal. Left eye exhibits no discharge. No scleral icterus.  Neck: Normal range of motion. Neck supple. No JVD present. No tracheal deviation present. No thyromegaly present.  Cardiovascular: Normal rate and regular rhythm.  Murmur (2/6 systolic murmur and S3 gallop noted) heard. Respiratory: Effort normal and breath sounds normal. No respiratory distress. He has no wheezes. He has no rales.  GI: Soft. Bowel sounds are normal. He exhibits no distension. There is no tenderness. There is no rebound.  Musculoskeletal: He exhibits no edema, tenderness or deformity.  Neurological: He is alert and oriented to person, place, and time. No cranial nerve deficit. Coordination normal.    Assessment/Plan: Resolving acute on chronic systolic congestive heart failure Severe nonischemic dilated cardiomyopathy Hypertension History of EtOH abuse History of polysubstance abuse Acute kidney injury Cardiorenal syndrome Plan Continue IV milrinone as per orders Switch IV Lasix drip to IV as per orders Check labs in a.m. Will get EP consultation for possible evaluation for ICD We will restart carvedilol and Entresto as blood pressure tolerates and once fully compensated Check labs in a.m.  Charolette Forward 08/27/2017,  11:20 AM

## 2017-08-27 NOTE — Progress Notes (Signed)
PROGRESS NOTE    Jonathan Wood  TXM:[id number] DOB: 09/06/1970 DOA: 08/25/2017 PCP: Patient, No Pcp Per   Brief Narrative:   Jonathan Wood is a 47 y.o. 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 Algie Coffer in [place].  The patient reports compliance of medications.  Over the past 4-5 days, he has had development of right upper quadrant pain, shortness of breath and cough.  He has also noted over a 10 pound weight gain at home with reported minimal urine output with Lasix over the last week.  He was noted to have elevated liver function tests and was admitted with what appears to be hepatic congestion secondary to acute on chronic systolic CHF decompensation.  He is currently in stepdown unit on Lasix drip with minimal urine output noted this far.  His Coreg and Sherryll Burger are currently withheld.  He has been evaluated by cardiology with 2D echocardiogram pending.  He is currently awaiting transfer to [facility] to be evaluated by his cardiologist Dr. Algie Coffer.   Assessment & Plan:   Active Problems:   Congestive dilated cardiomyopathy (HCC)   Acute on chronic systolic CHF (congestive heart failure) (HCC)   Elevated LFTs   Hepatic congestion   RUQ pain   Acute on chronic systolic (congestive) heart failure (HCC)   Acute renal failure (HCC)   Cardiogenic shock (HCC)  1. Acute on chronic systolic CHF decompensation in the setting of nonischemic cardiomyopathy with severe LV dysfunction. Continue on IV Lasix drip and monitor urine output. Appreciate cardiology recommendations S/p PICC line Started on IV Lasix infusion as well as IV Primacor drip. Tolerating very well with good urine output. There is also significant improvement in COx level as well. Continue.  2. Transaminitis likely secondary to above with hepatic congestion.  LFTs are currently downtrending.  Clinically he appears to be improved.   Abdominal CT and HIDA scan performed recently at [facility] him with no acute findings noted.   DVT prophylaxis: Lovenox Code Status: Full Family Communication: None at bedside Disposition Plan: Continue diuresis with inotropes per cardiology.     Consultants:   Cardiology Dr. Wyline Mood  Procedures:   None  Antimicrobials:   None   Subjective: Feeling better, breathing better.  No nausea no vomiting no chest pain.  Objective: Vitals:   08/27/17 0441 08/27/17 0809 08/27/17 1052 08/27/17 1527  BP: 98/69 103/65 101/75 90/68  Pulse: 95 88 95 92  Resp: (!) 32 (!) 26 (!) 24 (!) 28  Temp: 98.6 F (37 C) 97.7 F (36.5 C) 98.7 F (37.1 C) 97.9 F (36.6 C)  TempSrc: Oral Oral Oral Oral  SpO2: 100% 96% 95% 100%  Weight:      Height:        Intake/Output Summary (Last 24 hours) at 08/27/2017 1611 Last data filed at 08/27/2017 1500 Gross per 24 hour  Intake 1574.91 ml  Output 5750 ml  Net -4175.09 ml   Filed Weights   08/25/17 1405 08/26/17 0500 08/27/17 0230  Weight: 93.6 kg (206 lb 7 oz) 93 kg (205 lb 0.4 oz) 91.5 kg (201 lb 11.5 oz)    Examination:  General exam: Appears calm and comfortable  Respiratory system: Clear to auscultation. Respiratory effort normal. Currently on 2L [place]. Cardiovascular system: S1 & S2 heard, RRR. No JVD, murmurs, rubs, gallops or clicks. No pedal edema. Gastrointestinal system: Abdomen is nondistended, soft and nontender. No organomegaly or masses felt. Normal bowel sounds heard.  Central nervous system: Alert and oriented. No focal neurological deficits. Extremities: Symmetric 5 x 5 power. Skin: No rashes, lesions or ulcers Psychiatry: Judgement and insight appear normal. Mood & affect appropriate.     Data Reviewed: I have personally reviewed following labs and imaging studies  CBC: Recent Labs  Lab 08/25/17 1258 08/27/17 0845  WBC 8.0 7.4  HGB 11.4* 11.0*  HCT 35.8* 33.8*  MCV 90.6 88.3  PLT 174 145*   Basic Metabolic  Panel: Recent Labs  Lab 08/25/17 1258 08/26/17 0347 08/27/17 0845  NA 133* 134* 130*  K 3.7 3.7 3.6  CL 100* 102 96*  CO2 22 22 25   GLUCOSE 117* 122* 105*  BUN 33* 37* 32*  CREATININE 1.49* 1.52* 1.37*  CALCIUM 8.7* 8.5* 8.6*  MG  --   --  2.1   GFR: Estimated Creatinine Clearance: 80 mL/min (A) (by C-G formula based on SCr of 1.37 mg/dL (H)). Liver Function Tests: Recent Labs  Lab 08/25/17 1337 08/26/17 0347  AST 224* 181*  ALT 288* 270*  ALKPHOS 92 96  BILITOT 1.7* 1.4*  PROT 6.5 7.0  ALBUMIN 3.2* 3.2*   Recent Labs  Lab 08/25/17 1337  LIPASE 25   No results for input(s): AMMONIA in the last 168 hours. Coagulation Profile: No results for input(s): INR, PROTIME in the last 168 hours. Cardiac Enzymes: Recent Labs  Lab 08/25/17 1258 08/25/17 2231 08/26/17 0347 08/26/17 0859  TROPONINI 0.04* 0.05* 0.05* 0.05*   BNP (last 3 results) No results for input(s): PROBNP in the last 8760 hours. HbA1C: No results for input(s): HGBA1C in the last 72 hours. CBG: No results for input(s): GLUCAP in the last 168 hours. Lipid Profile: No results for input(s): CHOL, HDL, LDLCALC, TRIG, CHOLHDL, LDLDIRECT in the last 72 hours. Thyroid Function Tests: No results for input(s): TSH, T4TOTAL, FREET4, T3FREE, THYROIDAB in the last 72 hours. Anemia Panel: No results for input(s): VITAMINB12, FOLATE, FERRITIN, TIBC, IRON, RETICCTPCT in the last 72 hours. Sepsis Labs: No results for input(s): PROCALCITON, LATICACIDVEN in the last 168 hours.  Recent Results (from the past 240 hour(s))  MRSA PCR Screening     Status: None   Collection Time: 08/25/17  9:35 PM  Result Value Ref Range Status   MRSA by PCR NEGATIVE NEGATIVE Final    Comment:        The GeneXpert MRSA Assay (FDA approved for NASAL specimens only), is one component of a comprehensive MRSA colonization surveillance program. It is not intended to diagnose MRSA infection nor to guide or monitor treatment  for MRSA infections. Performed at [facility], [place], [address], [place], [place] [address]          Radiology Studies: No results found.      Scheduled Meds: . aspirin EC  81 mg Oral Daily  . Chlorhexidine Gluconate Cloth  6 each Topical Daily  . enoxaparin (LOVENOX) injection  40 mg Subcutaneous Q24H  . furosemide  40 mg Intravenous BID  . Living Better with Heart Failure Book   Does not apply Once  . potassium chloride  20 mEq Oral BID  . sodium chloride flush  10-40 mL Intracatheter Q12H  . sodium chloride flush  3 mL Intravenous Q12H  . traZODone  50 mg Oral QHS   Continuous Infusions: . sodium chloride    . milrinone 0.25 mcg/kg/min (08/27/17 1033)     LOS: 2 days    Time spent: 30 minutes    Electronically signed: Lynden Oxford, MD  [facility] 08/27/2017 4:14 PM    If 7PM-7AM, please contact night-coverage [email or web address] Password [email or web address] 08/27/2017, 4:11 PM

## 2017-08-27 NOTE — Progress Notes (Signed)
Pt under care of [facility] crew as of 0126. Off of the floor en Route to other facility. ([facility] - main)

## 2017-08-27 NOTE — Plan of Care (Signed)
Patient has been provided a basic review of why he has been transferred to [facility] due to his cardiologist being onsite. He also was explained his recent EF. He does not display any signs of anxiety. He is receiving IV lasix and is voiding frequently since arriving to the unit. Bed is in the lowest position with call bell in reach. He has been instructed to call for assistance and not to get up for patient safety due to decreased EF 5-10%. Will continue to monitor for patient safety.

## 2017-08-28 ENCOUNTER — Inpatient Hospital Stay (HOSPITAL_COMMUNITY): Payer: Medicaid Other

## 2017-08-28 LAB — COMPREHENSIVE METABOLIC PANEL
ALT: 209 U/L — ABNORMAL HIGH (ref 17–63)
ANION GAP: 8 (ref 5–15)
AST: 101 U/L — ABNORMAL HIGH (ref 15–41)
Albumin: 2.9 g/dL — ABNORMAL LOW (ref 3.5–5.0)
Alkaline Phosphatase: 100 U/L (ref 38–126)
BUN: 29 mg/dL — ABNORMAL HIGH (ref 6–20)
CHLORIDE: 95 mmol/L — AB (ref 101–111)
CO2: 29 mmol/L (ref 22–32)
Calcium: 8.9 mg/dL (ref 8.9–10.3)
Creatinine, Ser: 1.26 mg/dL — ABNORMAL HIGH (ref 0.61–1.24)
Glucose, Bld: 138 mg/dL — ABNORMAL HIGH (ref 65–99)
POTASSIUM: 4 mmol/L (ref 3.5–5.1)
Sodium: 132 mmol/L — ABNORMAL LOW (ref 135–145)
Total Bilirubin: 1.5 mg/dL — ABNORMAL HIGH (ref 0.3–1.2)
Total Protein: 6.7 g/dL (ref 6.5–8.1)

## 2017-08-28 LAB — COOXEMETRY PANEL
Carboxyhemoglobin: 2.2 % — ABNORMAL HIGH (ref 0.5–1.5)
Methemoglobin: 0.9 % (ref 0.0–1.5)
O2 SAT: 72.5 %
TOTAL HEMOGLOBIN: 12.3 g/dL (ref 12.0–16.0)

## 2017-08-28 LAB — MAGNESIUM
MAGNESIUM: 2.3 mg/dL (ref 1.7–2.4)
Magnesium: 2.3 mg/dL (ref 1.7–2.4)

## 2017-08-28 LAB — CBC
HCT: 35.7 % — ABNORMAL LOW (ref 39.0–52.0)
Hemoglobin: 11.7 g/dL — ABNORMAL LOW (ref 13.0–17.0)
MCH: 28.8 pg (ref 26.0–34.0)
MCHC: 32.8 g/dL (ref 30.0–36.0)
MCV: 87.9 fL (ref 78.0–100.0)
PLATELETS: 182 10*3/uL (ref 150–400)
RBC: 4.06 MIL/uL — ABNORMAL LOW (ref 4.22–5.81)
RDW: 13.2 % (ref 11.5–15.5)
WBC: 8.6 10*3/uL (ref 4.0–10.5)

## 2017-08-28 LAB — BASIC METABOLIC PANEL
ANION GAP: 9 (ref 5–15)
BUN: 23 mg/dL — ABNORMAL HIGH (ref 6–20)
CO2: 26 mmol/L (ref 22–32)
Calcium: 8.8 mg/dL — ABNORMAL LOW (ref 8.9–10.3)
Chloride: 96 mmol/L — ABNORMAL LOW (ref 101–111)
Creatinine, Ser: 1.33 mg/dL — ABNORMAL HIGH (ref 0.61–1.24)
GFR calc Af Amer: >60 mL/min (ref >60)
GFR calc non Af Amer: >60 mL/min (ref >60)
GLUCOSE: 94 mg/dL (ref 65–99)
POTASSIUM: 4.6 mmol/L (ref 3.5–5.1)
Sodium: 131 mmol/L — ABNORMAL LOW (ref 135–145)

## 2017-08-28 LAB — TROPONIN I: Troponin I: 0.04 ng/mL (ref <0.03)

## 2017-08-28 LAB — D-DIMER, QUANTITATIVE: D-Dimer, Quant: 3.66 ug/mL-FEU — ABNORMAL HIGH (ref 0.00–0.50)

## 2017-08-28 MED ORDER — TRAMADOL HCL 50 MG PO TABS
50.0000 mg | ORAL_TABLET | Freq: Four times a day (QID) | ORAL | Status: DC | PRN
  Administered 2017-08-28 – 2017-09-05 (×8): 50 mg via ORAL
  Filled 2017-08-28 (×10): qty 1

## 2017-08-28 MED ORDER — IOPAMIDOL (ISOVUE-370) INJECTION 76%
INTRAVENOUS | Status: AC
  Filled 2017-08-28: qty 100

## 2017-08-28 MED ORDER — IOPAMIDOL (ISOVUE-370) INJECTION 76%
100.0000 mL | Freq: Once | INTRAVENOUS | Status: AC | PRN
  Administered 2017-08-28: 100 mL via INTRAVENOUS

## 2017-08-28 MED ORDER — NITROGLYCERIN 0.4 MG SL SUBL
SUBLINGUAL_TABLET | SUBLINGUAL | Status: AC
  Filled 2017-08-28: qty 1

## 2017-08-28 MED ORDER — MILRINONE LACTATE IN DEXTROSE 20-5 MG/100ML-% IV SOLN
0.2320 ug/kg/min | INTRAVENOUS | Status: DC
  Filled 2017-08-28: qty 100

## 2017-08-28 MED ORDER — NITROGLYCERIN 0.4 MG SL SUBL
0.4000 mg | SUBLINGUAL_TABLET | SUBLINGUAL | Status: DC | PRN
  Administered 2017-08-28: 0.4 mg via SUBLINGUAL
  Filled 2017-08-28: qty 1

## 2017-08-28 MED ORDER — METHOCARBAMOL 500 MG PO TABS
500.0000 mg | ORAL_TABLET | Freq: Three times a day (TID) | ORAL | Status: DC
  Administered 2017-08-28 – 2017-09-01 (×15): 500 mg via ORAL
  Filled 2017-08-28 (×15): qty 1

## 2017-08-28 NOTE — Progress Notes (Signed)
Called to patients room by patient due to complaints of sharp intense right sided chest pain, 12 lead EKG obtained and one dose sublingual nitro given. Dr. Sharyn Lull notified, no new orders received. Will continue to monitor

## 2017-08-28 NOTE — Progress Notes (Signed)
Subjective:  Complaints of sharp musculoskeletal precordial chest pain localized. No anginal chest pain. States abdominal pain almost resolved. Diuresing well. Noted to have few beats of nonsustained VT on the monitor asymptomatic . Blood pressure remains soft  Objective:  Vital Signs in the last 24 hours: Temp:  [97.9 F (36.6 C)-98.2 F (36.8 C)] 98.2 F (36.8 C) (04/14 0817) Pulse Rate:  [65-94] 90 (04/14 0817) Resp:  [11-28] 11 (04/14 0817) BP: (86-147)/(59-93) 94/69 (04/14 0817) SpO2:  [96 %-100 %] 98 % (04/14 0817) Weight:  [71.7 kg (158 lb)] 71.7 kg (158 lb) (04/14 0517)  Intake/Output from previous day: 04/13 0701 - 04/14 0700 In: 684.3 [P.O.:480; I.V.:204.3] Out: 3750 [Urine:3750] Intake/Output from this shift: Total I/O In: 240 [P.O.:240] Out: 1150 [Urine:1150]  Physical Exam: Neck: no adenopathy, no carotid bruit, no JVD and supple, symmetrical, trachea midline Lungs: clear to auscultation bilaterally Heart: regular rate and rhythm, S1, S2 normal and 2/6 systolic murmur and S3 gallop noted Abdomen: soft, non-tender; bowel sounds normal; no masses,  no organomegaly Extremities: extremities normal, atraumatic, no cyanosis or edema  Lab Results: Recent Labs    08/27/17 0845 08/28/17 0431  WBC 7.4 8.6  HGB 11.0* 11.7*  PLT 145* 182   Recent Labs    08/27/17 0845 08/28/17 0431  NA 130* 132*  K 3.6 4.0  CL 96* 95*  CO2 25 29  GLUCOSE 105* 138*  BUN 32* 29*  CREATININE 1.37* 1.26*   Recent Labs    08/26/17 0347 08/26/17 0859  TROPONINI 0.05* 0.05*   Hepatic Function Panel Recent Labs    08/25/17 1337  08/28/17 0431  PROT 6.5   < > 6.7  ALBUMIN 3.2*   < > 2.9*  AST 224*   < > 101*  ALT 288*   < > 209*  ALKPHOS 92   < > 100  BILITOT 1.7*   < > 1.5*  BILIDIR 0.5  --   --   IBILI 1.2*  --   --    < > = values in this interval not displayed.   No results for input(s): CHOL in the last 72 hours. No results for input(s): PROTIME in the last 72  hours.  Imaging: Imaging results have been reviewed and No results found.  Cardiac Studies:  Assessment/Plan:  Resolving acute on chronic systolic congestive heart failure Severe nonischemic dilated cardiomyopathy Status post nonsustained VT is symptomatic Hypertension History of EtOH abuse History of polysubstance abuse Acute kidney injury Cardiorenal syndrome Shock liver Increased  LFTs  secondary to above  improved Plan Continue present management Check labs in a.m. Dr. Algie Coffer to follow from a.m.   LOS: 3 days    Jonathan Wood 08/28/2017, 11:50 AM

## 2017-08-28 NOTE — Progress Notes (Signed)
CRITICAL VALUE ALERT  Critical Value:  Troponin 0.04  Date & Time Notied:  08/28/2017 1115  Provider Notified: Dr. Allena Katz   Orders Received/Actions taken: New orders received and implemented

## 2017-08-28 NOTE — Progress Notes (Signed)
Pt continues to complain of sharp intense chest pain Dr. Allena Katz notified. New orders received will implemented. Will continue to monitor.

## 2017-08-28 NOTE — Progress Notes (Signed)
This patient is seen by Dr. Sharyn Lull.  [facility] General Cardiology will sign off.  Kalayla Shadden C. Duke Salvia, MD, [facility] 08/28/2017 11:16 AM

## 2017-08-28 NOTE — Progress Notes (Signed)
Notified Dr.Hawani about frequent NSVT order received to do labs and adjust Milrinone drip. I will continue to monitor.

## 2017-08-28 NOTE — Progress Notes (Signed)
[facility] Progress Note  Patient: Jonathan Wood QZR:[id number]   PCP: Patient, No Pcp Per DOB: 1970/10/29   DOA: 08/25/2017   DOS: 08/28/2017   Date of Service: the patient was seen and examined on 08/28/2017  Subjective: Complains about left-sided chest pain which gets worse when taking a deep breath.  Feels like pins and needles.  No nausea no vomiting.  No shortness of breath.  No diarrhea.  Pain in the right upper quadrant also resolved.  Brief hospital course: DEKENDRICK SLIGH a 47 y.o.malewith medical history significant ofnonischemic cardiomyopathy with ejection fraction of 20-25%, followed byDr. Alveta Heimlich. The patient reports compliance of medications. Over the past 4-5 days, he has had development of right upper quadrant pain, shortness of breath and cough.  He has also noted over a 10 pound weight gain at home with reported minimal urine output with Lasix over the last week.  He was noted to have elevated liver function tests and was admitted with what appears to be hepatic congestion secondary to acute on chronic systolic CHF decompensation.  Patient was initially admitted at [facility], cardiology was consulted, PICC line was placed, patient was started on IV Lasix infusion as well as IV Primacor drip.  Patient was transferred to [facility] to be evaluate by his primary cardiologist. Currently further plan is continue IV Lasix, continue Primacor per cardiology.  Assessment and Plan: 1.  Acute on chronic systolic CHF. Nonischemic cardia myopathy. Started on IV Lasix, transition to IV Lasix infusion. Starting 08/27/2017 patient is back on IV Lasix every 12 hours. Appreciate cardiology input. Continue monitoring. S/P PICC line, on IV Primacor drip. O2 saturation 72.5 on 08/28/2017 which is significantly better than presentation. Management per cardiology.  2.  Pleuritic chest pain. Elevated d-dimer. Check CT angiogram to rule out PE. Schedule  Robaxin, as needed tramadol.  3.  Elevated LFT. Trending down. Hepatic congestion from CHF. Continue management per cardiology. Avoid hepatotoxic medication.  4.  Essential hypertension. Blood pressure currently soft in the presence of Primacor drip. Holding [place], [address].  Diet: cardiac diet DVT Prophylaxis: subcutaneous Heparin  Advance goals of care discussion: full code  Family Communication: no family was present at bedside, at the time of interview.  Disposition:  Discharge to home.  Consultants: cardiology Procedures: PICC line placement  Antibiotics: Anti-infectives (From admission, onward)   None       Objective: Physical Exam: Vitals:   08/28/17 0517 08/28/17 0817 08/28/17 1140 08/28/17 1600  BP: (!) 147/93 94/69 (!) 81/66 91/62  Pulse: 65 90 (!) 101 96  Resp: 17 11 18 16   Temp:  98.2 F (36.8 C) 98.3 F (36.8 C) 98.5 F (36.9 C)  TempSrc:  Oral Oral Oral  SpO2: 99% 98% 99% 99%  Weight: 71.7 kg (158 lb)     Height:        Intake/Output Summary (Last 24 hours) at 08/28/2017 1609 Last data filed at 08/28/2017 1500 Gross per 24 hour  Intake 924.25 ml  Output 3700 ml  Net -2775.75 ml   Filed Weights   08/26/17 0500 08/27/17 0230 08/28/17 0517  Weight: 93 kg (205 lb 0.4 oz) 91.5 kg (201 lb 11.5 oz) 71.7 kg (158 lb)   General: Alert, Awake and Oriented to Time, Place and Person. Appear in mild distress, affect appropriate Eyes: PERRL, Conjunctiva normal ENT: Oral Mucosa clear moist. Neck: no JVD, no Abnormal Mass Or lumps Cardiovascular: S1 and S2 Present, no Murmur, Peripheral Pulses Present Respiratory: normal respiratory  effort, Bilateral Air entry equal and Decreased, no use of accessory muscle, Clear to Auscultation, no Crackles, no wheezes Abdomen: Bowel Sound present, Soft and no tenderness, no hernia Skin: no redness, no Rash, no induration Extremities: no Pedal edema, no calf tenderness Neurologic: Grossly no focal neuro deficit.  Bilaterally Equal motor strength  Data Reviewed: CBC: Recent Labs  Lab 08/25/17 1258 08/27/17 0845 08/28/17 0431  WBC 8.0 7.4 8.6  HGB 11.4* 11.0* 11.7*  HCT 35.8* 33.8* 35.7*  MCV 90.6 88.3 87.9  PLT 174 145* 182   Basic Metabolic Panel: Recent Labs  Lab 08/25/17 1258 08/26/17 0347 08/27/17 0845 08/28/17 0431  NA 133* 134* 130* 132*  K 3.7 3.7 3.6 4.0  CL 100* 102 96* 95*  CO2 22 22 25 29   GLUCOSE 117* 122* 105* 138*  BUN 33* 37* 32* 29*  CREATININE 1.49* 1.52* 1.37* 1.26*  CALCIUM 8.7* 8.5* 8.6* 8.9  MG  --   --  2.1 2.3    Liver Function Tests: Recent Labs  Lab 08/25/17 1337 08/26/17 0347 08/28/17 0431  AST 224* 181* 101*  ALT 288* 270* 209*  ALKPHOS 92 96 100  BILITOT 1.7* 1.4* 1.5*  PROT 6.5 7.0 6.7  ALBUMIN 3.2* 3.2* 2.9*   Recent Labs  Lab 08/25/17 1337  LIPASE 25   No results for input(s): AMMONIA in the last 168 hours. Coagulation Profile: No results for input(s): INR, PROTIME in the last 168 hours. Cardiac Enzymes: Recent Labs  Lab 08/25/17 1258 08/25/17 2231 08/26/17 0347 08/26/17 0859 08/28/17 1100  TROPONINI 0.04* 0.05* 0.05* 0.05* 0.04*   BNP (last 3 results) No results for input(s): PROBNP in the last 8760 hours. CBG: No results for input(s): GLUCAP in the last 168 hours. Studies: Dg Chest Port 1 View  Result Date: 08/28/2017 CLINICAL DATA:  Pleuritic chest pain. EXAM: PORTABLE CHEST 1 VIEW COMPARISON:  08/25/2017 FINDINGS: Mild cardiomegaly. Right arm PICC line in appropriate position. No evidence of pulmonary infiltrate or edema. No evidence of pleural effusion. IMPRESSION: Mild cardiomegaly.  No active lung disease. Electronically Signed   By: Myles Rosenthal M.D.   On: 08/28/2017 13:30    Scheduled Meds: . iopamidol      . aspirin EC  81 mg Oral Daily  . Chlorhexidine Gluconate Cloth  6 each Topical Daily  . enoxaparin (LOVENOX) injection  40 mg Subcutaneous Q24H  . furosemide  40 mg Intravenous BID  . Living Better with  Heart Failure Book   Does not apply Once  . methocarbamol  500 mg Oral TID  . nitroGLYCERIN      . polyethylene glycol  17 g Oral Daily  . potassium chloride  20 mEq Oral BID  . sodium chloride flush  10-40 mL Intracatheter Q12H  . sodium chloride flush  3 mL Intravenous Q12H  . traZODone  50 mg Oral QHS   Continuous Infusions: . sodium chloride    . milrinone 0.25 mcg/kg/min (08/28/17 0600)   PRN Meds: sodium chloride, acetaminophen, albuterol, guaiFENesin-dextromethorphan, nitroGLYCERIN, ondansetron (ZOFRAN) IV, sodium chloride flush, sodium chloride flush, traMADol  Time spent: 35 minutes  Author: Lynden Oxford, MD [facility] Pager: [phone] 08/28/2017 4:09 PM  If 7PM-7AM, please contact night-coverage at [email or web address], password [facility]

## 2017-08-29 DIAGNOSIS — I472 Ventricular tachycardia: Secondary | ICD-10-CM

## 2017-08-29 LAB — CBC
HEMATOCRIT: 36.1 % — AB (ref 39.0–52.0)
HEMOGLOBIN: 12 g/dL — AB (ref 13.0–17.0)
MCH: 29.5 pg (ref 26.0–34.0)
MCHC: 33.2 g/dL (ref 30.0–36.0)
MCV: 88.7 fL (ref 78.0–100.0)
Platelets: 175 10*3/uL (ref 150–400)
RBC: 4.07 MIL/uL — ABNORMAL LOW (ref 4.22–5.81)
RDW: 13.5 % (ref 11.5–15.5)
WBC: 10.1 10*3/uL (ref 4.0–10.5)

## 2017-08-29 LAB — COOXEMETRY PANEL
CARBOXYHEMOGLOBIN: 1.9 % — AB (ref 0.5–1.5)
Methemoglobin: 1.4 % (ref 0.0–1.5)
O2 Saturation: 79.4 %
TOTAL HEMOGLOBIN: 11 g/dL — AB (ref 12.0–16.0)

## 2017-08-29 MED ORDER — ALTEPLASE 2 MG IJ SOLR
2.0000 mg | Freq: Once | INTRAMUSCULAR | Status: AC
  Administered 2017-08-29: 2 mg

## 2017-08-29 MED ORDER — OXYCODONE HCL 5 MG PO TABS
5.0000 mg | ORAL_TABLET | Freq: Four times a day (QID) | ORAL | Status: AC | PRN
  Administered 2017-08-29 – 2017-08-30 (×2): 5 mg via ORAL
  Filled 2017-08-29 (×2): qty 1

## 2017-08-29 MED ORDER — DOXYCYCLINE HYCLATE 100 MG PO TABS
100.0000 mg | ORAL_TABLET | Freq: Two times a day (BID) | ORAL | Status: AC
  Administered 2017-08-29 – 2017-09-03 (×10): 100 mg via ORAL
  Filled 2017-08-29 (×10): qty 1

## 2017-08-29 MED ORDER — DIGOXIN 250 MCG PO TABS
0.2500 mg | ORAL_TABLET | Freq: Every day | ORAL | Status: AC
  Administered 2017-08-29 – 2017-08-30 (×2): 0.25 mg via ORAL
  Filled 2017-08-29 (×2): qty 1

## 2017-08-29 MED ORDER — DOXYCYCLINE HYCLATE 100 MG PO TABS
100.0000 mg | ORAL_TABLET | Freq: Two times a day (BID) | ORAL | Status: DC
  Administered 2017-08-29: 100 mg via ORAL
  Filled 2017-08-29: qty 1

## 2017-08-29 MED ORDER — PREDNISONE 20 MG PO TABS
20.0000 mg | ORAL_TABLET | Freq: Every day | ORAL | Status: DC
  Administered 2017-08-30 – 2017-09-02 (×4): 20 mg via ORAL
  Filled 2017-08-29 (×5): qty 1

## 2017-08-29 NOTE — Care Management Note (Signed)
Case Management Note  Patient Details  Name: Jonathan Wood MRN: [id number] Date of Birth: Jun 19, 1970  Subjective/Objective:      Pt admitted with acute on chronic HF              Action/Plan:  PTA from home. CM placed consult to HF nurse for evaluation.  PT independently ambulating in room..   Expected Discharge Date:                  Expected Discharge Plan:  Home w Home Health Services  In-House Referral:     Discharge planning Services  CM Consult  Post Acute Care Choice:    Choice offered to:     DME Arranged:    DME Agency:     HH Arranged:    HH Agency:     Status of Service:     If discussed at [employer] of Stay Meetings, dates discussed:    Additional Comments:  [name], RN 08/29/2017, 3:09 PM

## 2017-08-29 NOTE — Progress Notes (Signed)
Ref: Patient, No Pcp Per   Subjective:  Breathing improving. Afebrile. Chest x-ray is negative for infiltrate or pulmonary edema. EP consult for chronic CHF with significant low EF with and without acute on chronic heart failure. Non-ischemic cardiomyopathy.   Objective:  Vital Signs in the last 24 hours: Temp:  [97.6 F (36.4 C)-98.5 F (36.9 C)] 98.2 F (36.8 C) (04/15 1235) Pulse Rate:  [95-98] 98 (04/15 0823) Cardiac Rhythm: Normal sinus rhythm;Supraventricular tachycardia (04/15 0823) Resp:  [15-29] 15 (04/15 1235) BP: (80-107)/(55-71) 80/55 (04/15 1235) SpO2:  [93 %-99 %] 93 % (04/15 0823) Weight:  [84.7 kg (186 lb 11.2 oz)] 84.7 kg (186 lb 11.2 oz) (04/15 0435)  Physical Exam: BP Readings from Last 1 Encounters:  08/29/17 (!) 80/55     Wt Readings from Last 1 Encounters:  08/29/17 84.7 kg (186 lb 11.2 oz)    Weight change: 13 kg (28 lb 11.2 oz) Body mass index is 23.97 kg/m. HEENT: [place]/AT, Eyes-Brown, PERL, EOMI, Conjunctiva-Pink, Sclera-Non-icteric Neck: No JVD, No bruit, Trachea midline. Lungs:  Clearing, Bilateral. Cardiac:  Regular rhythm, normal S1 and S2, no S3. II/VI systolic murmur. Abdomen:  Soft, non-tender. BS present. Extremities:  No edema present. No cyanosis. No clubbing. CNS: AxOx3, Cranial nerves grossly intact, moves all 4 extremities.  Skin: Warm and dry.   Intake/Output from previous day: 04/14 0701 - 04/15 0700 In: 1102.4 [P.O.:960; I.V.:142.4] Out: 2700 [Urine:2700]    Lab Results: BMET    Component Value Date/Time   NA 131 (L) 08/28/2017 2033   NA 132 (L) 08/28/2017 0431   NA 130 (L) 08/27/2017 0845   K 4.6 08/28/2017 2033   K 4.0 08/28/2017 0431   K 3.6 08/27/2017 0845   CL 96 (L) 08/28/2017 2033   CL 95 (L) 08/28/2017 0431   CL 96 (L) 08/27/2017 0845   CO2 26 08/28/2017 2033   CO2 29 08/28/2017 0431   CO2 25 08/27/2017 0845   GLUCOSE 94 08/28/2017 2033   GLUCOSE 138 (H) 08/28/2017 0431   GLUCOSE 105 (H) 08/27/2017 0845   BUN 23 (H) 08/28/2017 2033   BUN 29 (H) 08/28/2017 0431   BUN 32 (H) 08/27/2017 0845   CREATININE 1.33 (H) 08/28/2017 2033   CREATININE 1.26 (H) 08/28/2017 0431   CREATININE 1.37 (H) 08/27/2017 0845   CALCIUM 8.8 (L) 08/28/2017 2033   CALCIUM 8.9 08/28/2017 0431   CALCIUM 8.6 (L) 08/27/2017 0845   GFRNONAA >60 08/28/2017 2033   GFRNONAA >60 08/28/2017 0431   GFRNONAA >60 08/27/2017 0845   GFRAA >60 08/28/2017 2033   GFRAA >60 08/28/2017 0431   GFRAA >60 08/27/2017 0845   CBC    Component Value Date/Time   WBC 10.1 08/29/2017 0500   RBC 4.07 (L) 08/29/2017 0500   HGB 12.0 (L) 08/29/2017 0500   HCT 36.1 (L) 08/29/2017 0500   PLT 175 08/29/2017 0500   MCV 88.7 08/29/2017 0500   MCH 29.5 08/29/2017 0500   MCHC 33.2 08/29/2017 0500   RDW 13.5 08/29/2017 0500   LYMPHSABS 5.6 (H) 10/08/2016 0332   MONOABS 0.9 10/08/2016 0332   EOSABS 0.3 10/08/2016 0332   BASOSABS 0.0 10/08/2016 0332   HEPATIC Function Panel Recent Labs    08/25/17 1337 08/26/17 0347 08/28/17 0431  PROT 6.5 7.0 6.7   HEMOGLOBIN A1C No components found for: HGA1C,  MPG CARDIAC ENZYMES Lab Results  Component Value Date   TROPONINI 0.04 (HH) 08/28/2017   TROPONINI 0.05 (HH) 08/26/2017   TROPONINI 0.05 (HH)  08/26/2017   BNP No results for input(s): PROBNP in the last 8760 hours. TSH No results for input(s): TSH in the last 8760 hours. CHOLESTEROL No results for input(s): CHOL in the last 8760 hours.  Scheduled Meds: . aspirin EC  81 mg Oral Daily  . Chlorhexidine Gluconate Cloth  6 each Topical Daily  . digoxin  0.25 mg Oral Daily  . doxycycline  100 mg Oral Q12H  . enoxaparin (LOVENOX) injection  40 mg Subcutaneous Q24H  . furosemide  40 mg Intravenous BID  . Living Better with Heart Failure Book   Does not apply Once  . methocarbamol  500 mg Oral TID  . polyethylene glycol  17 g Oral Daily  . potassium chloride  20 mEq Oral BID  . [START ON 08/30/2017] predniSONE  20 mg Oral Q breakfast  .  sodium chloride flush  10-40 mL Intracatheter Q12H  . sodium chloride flush  3 mL Intravenous Q12H  . traZODone  50 mg Oral QHS   Continuous Infusions: . sodium chloride    . milrinone 0.232 mcg/kg/min (08/29/17 0440)   PRN Meds:.sodium chloride, acetaminophen, albuterol, guaiFENesin-dextromethorphan, nitroGLYCERIN, ondansetron (ZOFRAN) IV, sodium chloride flush, sodium chloride flush, traMADol  Assessment/Plan: Acute on chronic systolic left heart failure Dilated cardiomyopathy Possible pneumonia PE ruled out Essential hypertension  Appreciate EP consult. Add Lanoxin DC Milrinone tomorrow. Increase activity.    LOS: 4 days    Orpah Cobb  MD  08/29/2017, 3:16 PM

## 2017-08-29 NOTE — Progress Notes (Signed)
[facility] Progress Note  Patient: Jonathan Wood GNF:[id number]   PCP: Patient, No Pcp Per DOB: 08-09-1970   DOA: 08/25/2017   DOS: 08/29/2017   Date of Service: the patient was seen and examined on 08/29/2017  Subjective: still feeling chest pain, no other acute complains   Brief hospital course: Jonathan Wood a 46 y.o.malewith medical history significant ofnonischemic cardiomyopathy with ejection fraction of 20-25%, followed byDr. Alveta Heimlich. The patient reports compliance of medications. Over the past 4-5 days, he has had development of right upper quadrant pain, shortness of breath and cough.  He has also noted over a 10 pound weight gain at home with reported minimal urine output with Lasix over the last week.  He was noted to have elevated liver function tests and was admitted with what appears to be hepatic congestion secondary to acute on chronic systolic CHF decompensation.  Patient was initially admitted at [facility], cardiology was consulted, PICC line was placed, patient was started on IV Lasix infusion as well as IV Primacor drip.  Patient was transferred to [facility] to be evaluate by his primary cardiologist. Currently further plan is continue IV Lasix, continue Primacor per cardiology.  Assessment and Plan: 1.  Acute on chronic systolic CHF. Nonischemic cardia myopathy. Started on IV Lasix, transition to IV Lasix infusion. Starting 08/27/2017 patient is back on IV Lasix every 12 hours. Appreciate cardiology input. Continue monitoring. S/P PICC line, on IV Primacor drip. O2 saturation 72.5 on 08/28/2017 which is significantly better than presentation. Management per cardiology.  2.  Pleuritic chest pain. Elevated d-dimer. CAP CT angiogram rule out PE. Shows a possible atelectasis vs infiltrate in the left lingula area, correlated with pt;s pain location Likely pleurisy Add low dose prednisone an doxycycline for 5 days. No taper  needed. Schedule Robaxin, as needed tramadol.  3.  Elevated LFT. Trending down. Hepatic congestion from CHF. Continue management per cardiology. Avoid hepatotoxic medication.  4.  Essential hypertension. Blood pressure currently soft in the presence of Primacor drip. Holding Ambia, [address].  Diet: cardiac diet DVT Prophylaxis: subcutaneous Heparin  Advance goals of care discussion: full code  Family Communication: no family was present at bedside, at the time of interview.  Disposition:  Discharge to home.  Consultants: cardiology Procedures: PICC line placement  Antibiotics: Anti-infectives (From admission, onward)   Start     Dose/Rate Route Frequency Ordered Stop   08/29/17 1000  doxycycline (VIBRA-TABS) tablet 100 mg     100 mg Oral Every 12 hours 08/29/17 0802         Objective: Physical Exam: Vitals:   08/28/17 2249 08/29/17 0435 08/29/17 0823 08/29/17 1235  BP: 107/61 91/63 (!) 86/71 (!) 80/55  Pulse: 96 95 98   Resp: (!) 21 (!) 29 19 15   Temp: 97.9 F (36.6 C) 98.2 F (36.8 C) 97.8 F (36.6 C) 98.2 F (36.8 C)  TempSrc: Oral Oral Oral Oral  SpO2: 97% 98% 93%   Weight:  84.7 kg (186 lb 11.2 oz)    Height:        Intake/Output Summary (Last 24 hours) at 08/29/2017 1321 Last data filed at 08/29/2017 0852 Gross per 24 hour  Intake 982.4 ml  Output 1650 ml  Net -667.6 ml   Filed Weights   08/27/17 0230 08/28/17 0517 08/29/17 0435  Weight: 91.5 kg (201 lb 11.5 oz) 71.7 kg (158 lb) 84.7 kg (186 lb 11.2 oz)   General: Alert, Awake and Oriented to Time, Place and Person.  Appear in mild distress, affect appropriate Eyes: PERRL, Conjunctiva normal ENT: Oral Mucosa clear moist. Neck: no JVD, no Abnormal Mass Or lumps Cardiovascular: S1 and S2 Present, no Murmur, Peripheral Pulses Present Respiratory: normal respiratory effort, Bilateral Air entry equal and Decreased, no use of accessory muscle, Clear to Auscultation, no Crackles, no wheezes Abdomen:  Bowel Sound present, Soft and no tenderness, no hernia Skin: no redness, no Rash, no induration Extremities: no Pedal edema, no calf tenderness Neurologic: Grossly no focal neuro deficit. Bilaterally Equal motor strength  Data Reviewed: CBC: Recent Labs  Lab 08/25/17 1258 08/27/17 0845 08/28/17 0431 08/29/17 0500  WBC 8.0 7.4 8.6 10.1  HGB 11.4* 11.0* 11.7* 12.0*  HCT 35.8* 33.8* 35.7* 36.1*  MCV 90.6 88.3 87.9 88.7  PLT 174 145* 182 175   Basic Metabolic Panel: Recent Labs  Lab 08/25/17 1258 08/26/17 0347 08/27/17 0845 08/28/17 0431 08/28/17 2033  NA 133* 134* 130* 132* 131*  K 3.7 3.7 3.6 4.0 4.6  CL 100* 102 96* 95* 96*  CO2 22 22 25 29 26   GLUCOSE 117* 122* 105* 138* 94  BUN 33* 37* 32* 29* 23*  CREATININE 1.49* 1.52* 1.37* 1.26* 1.33*  CALCIUM 8.7* 8.5* 8.6* 8.9 8.8*  MG  --   --  2.1 2.3 2.3    Liver Function Tests: Recent Labs  Lab 08/25/17 1337 08/26/17 0347 08/28/17 0431  AST 224* 181* 101*  ALT 288* 270* 209*  ALKPHOS 92 96 100  BILITOT 1.7* 1.4* 1.5*  PROT 6.5 7.0 6.7  ALBUMIN 3.2* 3.2* 2.9*   Recent Labs  Lab 08/25/17 1337  LIPASE 25   No results for input(s): AMMONIA in the last 168 hours. Coagulation Profile: No results for input(s): INR, PROTIME in the last 168 hours. Cardiac Enzymes: Recent Labs  Lab 08/25/17 1258 08/25/17 2231 08/26/17 0347 08/26/17 0859 08/28/17 1100  TROPONINI 0.04* 0.05* 0.05* 0.05* 0.04*   BNP (last 3 results) No results for input(s): PROBNP in the last 8760 hours. CBG: No results for input(s): GLUCAP in the last 168 hours. Studies: Ct Angio Chest Pe W Or Wo Contrast  Result Date: 08/28/2017 CLINICAL DATA:  Pleuritic chest pain this morning, elevated D-dimer, question pulmonary embolism, history asthma, CHF, GERD, smoker EXAM: CT ANGIOGRAPHY CHEST WITH CONTRAST TECHNIQUE: Multidetector CT imaging of the chest was performed using the standard protocol during bolus administration of intravenous contrast.  Multiplanar CT image reconstructions and MIPs were obtained to evaluate the vascular anatomy. CONTRAST:  ISOVUE-370 IOPAMIDOL (ISOVUE-370) INJECTION 76% IV COMPARISON:  03/22/2017 FINDINGS: Cardiovascular: Aorta normal caliber. Enlargement of cardiac chambers. Pulmonary arteries well opacified and patent. No evidence of pulmonary embolism. No pericardial effusion. Mediastinum/Nodes: Base of cervical region unremarkable. Esophagus normal appearance. No thoracic adenopathy. Lungs/Pleura: Scattered subpleural blebs greatest at anteromedial LEFT lung. Focus of infiltrate identified within lingula question pneumonia. Remaining lungs clear. No pleural effusion or pneumothorax. Upper Abdomen: Visualized upper abdomen unremarkable Musculoskeletal: Osseous structures normal appearance. Review of the MIP images confirms the above findings. IMPRESSION: No evidence of pulmonary embolism. Enlargement of cardiac chambers. Focus of lingular infiltrate question pneumonia. Scattered subpleural blebs in both lungs greatest in the anteromedial LEFT lung. Electronically Signed   By: Ulyses Southward M.D.   On: 08/28/2017 18:11    Scheduled Meds: . aspirin EC  81 mg Oral Daily  . Chlorhexidine Gluconate Cloth  6 each Topical Daily  . doxycycline  100 mg Oral Q12H  . enoxaparin (LOVENOX) injection  40 mg Subcutaneous Q24H  .  furosemide  40 mg Intravenous BID  . Living Better with Heart Failure Book   Does not apply Once  . methocarbamol  500 mg Oral TID  . polyethylene glycol  17 g Oral Daily  . potassium chloride  20 mEq Oral BID  . sodium chloride flush  10-40 mL Intracatheter Q12H  . sodium chloride flush  3 mL Intravenous Q12H  . traZODone  50 mg Oral QHS   Continuous Infusions: . sodium chloride    . milrinone 0.232 mcg/kg/min (08/29/17 0440)   PRN Meds: sodium chloride, acetaminophen, albuterol, guaiFENesin-dextromethorphan, nitroGLYCERIN, ondansetron (ZOFRAN) IV, sodium chloride flush, sodium chloride flush,  traMADol  Time spent: 35 minutes  Author: Lynden Oxford, MD [facility] Pager: [phone] 08/29/2017 1:21 PM  If 7PM-7AM, please contact night-coverage at [email or web address], password [facility]

## 2017-08-29 NOTE — Progress Notes (Addendum)
Heart Failure Navigator Consult Note  Presentation: Jonathan Wood is a 47 y.o. male with a hx of NICM, HTN, chronic CHF (systolic), hx of remote ETOH abuse, still drinks a couple beers on weekends and tobacco abuse, reports he quit smoking 2 weeks ago, asthma.  Past Medical History:  Diagnosis Date  . Asthma   . CHF (congestive heart failure) (HCC)    a. 09/2016: EF 20-25% with cath showing normal cors  . GERD (gastroesophageal reflux disease)   . History of hiatal hernia     Social History   Socioeconomic History  . Marital status: Legally Separated    Spouse name: Not on file  . Number of children: Not on file  . Years of education: Not on file  . Highest education level: Not on file  Occupational History  . Not on file  Social Needs  . Financial resource strain: Not on file  . Food insecurity:    Worry: Not on file    Inability: Not on file  . Transportation needs:    Medical: Not on file    Non-medical: Not on file  Tobacco Use  . Smoking status: Current Every Day Smoker    Packs/day: 0.50    Years: 25.00    Pack years: 12.50    Types: Cigarettes  . Smokeless tobacco: Never Used  . Tobacco comment: quit 1 week  Substance and Sexual Activity  . Alcohol use: Yes    Comment: occas  . Drug use: No  . Sexual activity: Yes  Lifestyle  . Physical activity:    Days per week: Not on file    Minutes per session: Not on file  . Stress: Not on file  Relationships  . Social connections:    Talks on phone: Not on file    Gets together: Not on file    Attends religious service: Not on file    Active member of club or organization: Not on file    Attends meetings of clubs or organizations: Not on file    Relationship status: Not on file  Other Topics Concern  . Not on file  Social History Narrative  . Not on file    ECHO:Study Conclusions 08/26/17  - Left ventricle: The cavity size was mildly to moderately dilated.   Wall thickness was normal. The  estimated ejection fraction was in   the range of 5% to 10%. Diffuse hypokinesis. - Aortic valve: Valve area (VTI): 1.73 cm^2. Valve area (Vmax):   1.86 cm^2. - Mitral valve: There was mild regurgitation. - Left atrium: The atrium was moderately dilated. - Right ventricle: The ventricular septum is flattened in systole   consistent with RV pressure overload. The cavity size was mildly   dilated. Systolic function was moderately to severely reduced. - Right atrium: The atrium was moderately dilated. - Atrial septum: No defect or patent foramen ovale was identified. - Inferior vena cava: The vessel was dilated. The respirophasic   diameter changes were blunted (< 50%), consistent with elevated   central venous pressure. - Technically adequate study.  BNP    Component Value Date/Time   BNP 2,510.0 (H) 08/25/2017 1337    ProBNP No results found for: PROBNP   Education Assessment and Provision:  Detailed education and instructions provided on heart failure disease management including the following:  Signs and symptoms of Heart Failure When to call the physician Importance of daily weights Low sodium diet Fluid restriction Medication management Anticipated future follow-up appointments  Patient  education given on each of the above topics.  Patient acknowledges understanding and acceptance of all instructions.  I spoke with Jonathan Wood reagrding his HF and current hospitalization.  He tells me that he has been "here 2 times before" and that he sees Jonathan Wood as an outpatient.  He reports that he does not have a scale.  We discussed the importance of daily weights and when to contact the physician.  I provided him a scale for home use.  I briefly reviewed a low sodium diet and high sodium foods to avoid.  He admits that he does not have insurance currently- he also tells me that he has trouble affording medications at times because of this.  He lives in [place] with his 3  children (15, 46 and 6 years).  He has been working full-time at [employer]. He will greatly benefit from resources available through the [facility].  I have made referral to the HF clinic [facility] for assistance with medicaid for ongoing financial concerns and HF Pharm D for assistance with medication assistance programs Sherryll Burger).  I will provide his medications at discharge through the HF Fund.  He tells me that he has a car and can get to and from appts no problem.  Education Materials:  "Living Better With Heart Failure" Booklet, Daily Weight Tracker Tool    High Risk Criteria for Readmission and/or Poor Patient Outcomes:  (Recommend Follow-up with [facility])--yes   EF <30%- Yes 5-10%  2 or more admissions in 6 months-No  Difficult social situation- Yes no insurance noted  Demonstrates medication noncompliance- Yes admits to not being able to afford medications  Barriers of Care:  Financial, Knowledge and compliance  Discharge Planning:   Plans to return to home in [place].  Unfortunately he will not be eligible for HF [employer] [facility] secondary to living in [place].  He will benefit from [facility] for ongoing education, symptom recognition and compliance reinforcement.

## 2017-08-29 NOTE — Consult Note (Addendum)
Cardiology Consultation:   Patient ID: Jonathan Wood; [id number]; [birth year]   Admit date: 08/25/2017 Date of Consult: 08/29/2017  Primary Care Provider: Patient, No Pcp Per Primary Cardiologist: Dr. Algie Coffer   Patient Profile:   Jonathan Wood is a 47 y.o. male with a hx of NICM, HTN, chronic CHF (systolic), hx of remote ETOH abuse, still drinks a couple beers on weekends and tobacco abuse, reports he quit smoking 2 weeks ago, asthma,  who is being seen today to evaluate for ICD implant at the request of Dr. Algie Coffer.  History of Present Illness:   Jonathan Wood was initially seen at [facility] with c/o progressive DOE and abdominal discomfort, weight gain found in CHF exacerbation with elevated LFTs as well thought to be shock liver 2/2 CHF, and AKI, transferred to [facility] for care/management, started on lasix gtt and eventually milrinone as well with improved diuresis.  He was observed on telemetry to have infrequent NSVT, given hx of NICM, EP is asked to evaluate for ICD implant.  He had diuresed now cumulatively - 25lbs down in weight Remains on milrinone rate reduced this AM to 56ml/hr IV lasix (gtt stopped)  SBP 80's-90's  LABS: K+ 4.6 BUN/Creat 23/1.33 WBC 10.1 H/H 12/36 Plts 175  08/28/17 AST 101, ALT 209 trending downward  Home HF meds: Lasix entresto Carvedilol  The patient feels less bloated, remains generally weak, no rest SOB, c/o L chest pain that is reproducible with palpation.  No hx of near syncope or syncope.  He tells me he has no energy at baseline, very limited with poor exertional capacity since last year when first diagnosed with his CM  Past Medical History:  Diagnosis Date  . Asthma   . CHF (congestive heart failure) (HCC)    a. 09/2016: EF 20-25% with cath showing normal cors  . GERD (gastroesophageal reflux disease)   . History of hiatal hernia     Past Surgical History:  Procedure Laterality Date  . NASAL FRACTURE SURGERY  1987  .  RIGHT/LEFT HEART CATH AND CORONARY ANGIOGRAPHY N/A 10/08/2016   Procedure: Right/Left Heart Cath and Coronary Angiography;  Surgeon: Orpah Cobb, MD;  Location: [facility] INVASIVE CV LAB;  Service: Cardiovascular;  Laterality: N/A;       Inpatient Medications: Scheduled Meds: . aspirin EC  81 mg Oral Daily  . Chlorhexidine Gluconate Cloth  6 each Topical Daily  . doxycycline  100 mg Oral Q12H  . enoxaparin (LOVENOX) injection  40 mg Subcutaneous Q24H  . furosemide  40 mg Intravenous BID  . Living Better with Heart Failure Book   Does not apply Once  . methocarbamol  500 mg Oral TID  . polyethylene glycol  17 g Oral Daily  . potassium chloride  20 mEq Oral BID  . sodium chloride flush  10-40 mL Intracatheter Q12H  . sodium chloride flush  3 mL Intravenous Q12H  . traZODone  50 mg Oral QHS   Continuous Infusions: . sodium chloride    . milrinone 0.232 mcg/kg/min (08/29/17 0440)   PRN Meds: sodium chloride, acetaminophen, albuterol, guaiFENesin-dextromethorphan, nitroGLYCERIN, ondansetron (ZOFRAN) IV, sodium chloride flush, sodium chloride flush, traMADol  Allergies:   No Known Allergies  Social History:   Social History   Socioeconomic History  . Marital status: Legally Separated    Spouse name: Not on file  . Number of children: Not on file  . Years of education: Not on file  . Highest education level: Not on file  Occupational History  .  Not on file  Social Needs  . Financial resource strain: Not on file  . Food insecurity:    Worry: Not on file    Inability: Not on file  . Transportation needs:    Medical: Not on file    Non-medical: Not on file  Tobacco Use  . Smoking status: Current Every Day Smoker    Packs/day: 0.50    Years: 25.00    Pack years: 12.50    Types: Cigarettes  . Smokeless tobacco: Never Used  . Tobacco comment: quit 1 week  Substance and Sexual Activity  . Alcohol use: Yes    Comment: occas  . Drug use: No  . Sexual activity: Yes  Lifestyle    . Physical activity:    Days per week: Not on file    Minutes per session: Not on file  . Stress: Not on file  Relationships  . Social connections:    Talks on phone: Not on file    Gets together: Not on file    Attends religious service: Not on file    Active member of club or organization: Not on file    Attends meetings of clubs or organizations: Not on file    Relationship status: Not on file  . Intimate partner violence:    Fear of current or ex partner: Not on file    Emotionally abused: Not on file    Physically abused: Not on file    Forced sexual activity: Not on file  Other Topics Concern  . Not on file  Social History Narrative  . Not on file    Family History:   Family History  Problem Relation Age of Onset  . Hypertension Father      ROS:  Please see the history of present illness.  All other ROS reviewed and negative.     Physical Exam/Data:   Vitals:   08/28/17 1942 08/28/17 2249 08/29/17 0435 08/29/17 0823  BP: 95/68 107/61 91/63 (!) 86/71  Pulse: 95 96 95 98  Resp: (!) 21 (!) 21 (!) 29 19  Temp: 97.6 F (36.4 C) 97.9 F (36.6 C) 98.2 F (36.8 C) 97.8 F (36.6 C)  TempSrc: Oral Oral Oral Oral  SpO2: 97% 97% 98% 93%  Weight:   186 lb 11.2 oz (84.7 kg)   Height:        Intake/Output Summary (Last 24 hours) at 08/29/2017 1011 Last data filed at 08/29/2017 0852 Gross per 24 hour  Intake 1102.4 ml  Output 2800 ml  Net -1697.6 ml   Filed Weights   08/27/17 0230 08/28/17 0517 08/29/17 0435  Weight: 201 lb 11.5 oz (91.5 kg) 158 lb (71.7 kg) 186 lb 11.2 oz (84.7 kg)   Body mass index is 23.97 kg/m.  General:  Well nourished, well developed, in no acute distress HEENT: normal Lymph: no adenopathy Neck: no JVD Endocrine:  No thryomegaly Vascular: No carotid bruits Cardiac:  RRR; soft SM, no gallops or rubs Lungs:  CTA b/l, no wheezing, rhonchi or rales  Abd: soft, nontender Ext: trace edema Musculoskeletal:  No deformities Skin: warm and  dry  Neuro:  No focal abnormalities noted Psych:  Normal affect   EKG:  The EKG was personally reviewed and demonstrates:   #1 is SR 86bpm, QRS 90ms, PVC #2 SR 93bpm, inf T changes Telemetry:  Telemetry was personally reviewed and demonstrates:   SR, 80's-90,s infrequent PVCs, NSVT longest 9 beats  Relevant CV Studies:  08/26/17: TTE Study Conclusions -  Left ventricle: The cavity size was mildly to moderately dilated.   Wall thickness was normal. The estimated ejection fraction was in   the range of 5% to 10%. Diffuse hypokinesis. - Aortic valve: Valve area (VTI): 1.73 cm^2. Valve area (Vmax):   1.86 cm^2. - Mitral valve: There was mild regurgitation. - Left atrium: The atrium was moderately dilated. - Right ventricle: The ventricular septum is flattened in systole   consistent with RV pressure overload. The cavity size was mildly   dilated. Systolic function was moderately to severely reduced. - Right atrium: The atrium was moderately dilated. - Atrial septum: No defect or patent foramen ovale was identified. - Inferior vena cava: The vessel was dilated. The respirophasic   diameter changes were blunted (< 50%), consistent with elevated   central venous pressure. - Technically adequate study.  10/08/16: R/LHC  Hemodynamic findings consistent with mild pulmonary hypertension.  Normal coronaries Medical treatment.  Laboratory Data:  Chemistry Recent Labs  Lab 08/27/17 0845 08/28/17 0431 08/28/17 2033  NA 130* 132* 131*  K 3.6 4.0 4.6  CL 96* 95* 96*  CO2 25 29 26   GLUCOSE 105* 138* 94  BUN 32* 29* 23*  CREATININE 1.37* 1.26* 1.33*  CALCIUM 8.6* 8.9 8.8*  GFRNONAA >60 >60 >60  GFRAA >60 >60 >60  ANIONGAP 9 8 9     Recent Labs  Lab 08/25/17 1337 08/26/17 0347 08/28/17 0431  PROT 6.5 7.0 6.7  ALBUMIN 3.2* 3.2* 2.9*  AST 224* 181* 101*  ALT 288* 270* 209*  ALKPHOS 92 96 100  BILITOT 1.7* 1.4* 1.5*   Hematology Recent Labs  Lab 08/27/17 0845  08/28/17 0431 08/29/17 0500  WBC 7.4 8.6 10.1  RBC 3.83* 4.06* 4.07*  HGB 11.0* 11.7* 12.0*  HCT 33.8* 35.7* 36.1*  MCV 88.3 87.9 88.7  MCH 28.7 28.8 29.5  MCHC 32.5 32.8 33.2  RDW 13.6 13.2 13.5  PLT 145* 182 175   Cardiac Enzymes Recent Labs  Lab 08/25/17 1258 08/25/17 2231 08/26/17 0347 08/26/17 0859 08/28/17 1100  TROPONINI 0.04* 0.05* 0.05* 0.05* 0.04*   No results for input(s): TROPIPOC in the last 168 hours.  BNP Recent Labs  Lab 08/25/17 1337  BNP 2,510.0*    DDimer  Recent Labs  Lab 08/28/17 1100  DDIMER 3.66*    Radiology/Studies:  Dg Chest 2 View Result Date: 08/25/2017 CLINICAL DATA:  Chest pain EXAM: CHEST - 2 VIEW COMPARISON:  August 15, 2017 FINDINGS: There is mild perihilar scarring on the left. There is no edema or consolidation. Heart is mildly enlarged, stable, with pulmonary vascularity within normal limits. No adenopathy. No bone lesions. IMPRESSION: Stable mild cardiomegaly. No edema or consolidation. Mild left-sided perihilar scarring is stable. Electronically Signed   By: Bretta Bang III M.D.   On: 08/25/2017 13:24   Ct Angio Chest Pe W Or Wo Contrast Result Date: 08/28/2017 CLINICAL DATA:  Pleuritic chest pain this morning, elevated D-dimer, question pulmonary embolism, history asthma, CHF, GERD, smoker EXAM: CT ANGIOGRAPHY CHEST WITH CONTRAST TECHNIQUE: Multidetector CT imaging of the chest was performed using the standard protocol during bolus administration of intravenous contrast. Multiplanar CT image reconstructions and MIPs were obtained to evaluate the vascular anatomy. CONTRAST:  ISOVUE-370 IOPAMIDOL (ISOVUE-370) INJECTION 76% IV COMPARISON:  03/22/2017 FINDINGS: Cardiovascular: Aorta normal caliber. Enlargement of cardiac chambers. Pulmonary arteries well opacified and patent. No evidence of pulmonary embolism. No pericardial effusion. Mediastinum/Nodes: Base of cervical region unremarkable. Esophagus normal appearance. No thoracic  adenopathy. Lungs/Pleura: Scattered subpleural  blebs greatest at anteromedial LEFT lung. Focus of infiltrate identified within lingula question pneumonia. Remaining lungs clear. No pleural effusion or pneumothorax. Upper Abdomen: Visualized upper abdomen unremarkable Musculoskeletal: Osseous structures normal appearance. Review of the MIP images confirms the above findings. IMPRESSION: No evidence of pulmonary embolism. Enlargement of cardiac chambers. Focus of lingular infiltrate question pneumonia. Scattered subpleural blebs in both lungs greatest in the anteromedial LEFT lung. Electronically Signed   By: Ulyses Southward M.D.   On: 08/28/2017 18:11   Dg Chest Port 1 View Result Date: 08/28/2017 CLINICAL DATA:  Pleuritic chest pain. EXAM: PORTABLE CHEST 1 VIEW COMPARISON:  08/25/2017 FINDINGS: Mild cardiomegaly. Right arm PICC line in appropriate position. No evidence of pulmonary infiltrate or edema. No evidence of pleural effusion. IMPRESSION: Mild cardiomegaly.  No active lung disease. Electronically Signed   By: Myles Rosenthal M.D.   On: 08/28/2017 13:30    Assessment and Plan:   1. Acute on Chronic CHF exacerbation     Fluid negative, down 25lbs by charting     Soft BP     C/w primary cardiology  2. NICM     Dates back at least to May 2018, appears BB/Entresto started after cath in may 208     LVEF is severely reduced at 5-10% despite meds     He may benefit from AHF team consult      He is a candidate for ICD     Remains on milrinone with soft BP     Off Entresto with AKI and soft BP, off BB with acute CHF soft BP     Narrow QRS, not CRT candidate     Will discuss timing with Dr. Ladona Ridgel who will see later today   For questions or updates, please contact [facility] Please consult [email or web address] for contact info under Cardiology/STEMI.   Signed, Sheilah Pigeon, PA-C  08/29/2017 10:11 AM  EP attending patient seen and examined.  Agree with the findings as noted above.  The patient  has advanced heart failure and severe left ventricular dysfunction.  He has been on maximal medical therapy.  He is undergone large volume diuresis with the support of intravenous milrinone.  Previously he had been on maximal medical therapy and denies dietary or medical noncompliance.  He is a narrow QRS.  He has not had syncope.  He does have nonsustained VT on telemetry.  The patient would be a candidate for ICD insertion, although I suspect he may require advanced therapies.  We will follow the patient with you, but would recommend advanced heart failure consult.  If the patient is thought to best be treated with intravenous inotropes as an outpatient, and we could consider an ICD.  However I think a LifeVest at least until we know what his next treatment option is would be most appropriate.  Specifically if he is a candidate for LVAD, then I think this therapy would likely not require [place] to place an ICD.  There is no indication for antiarrhythmic drug therapy at this time.   Lewayne Bunting, MD

## 2017-08-30 ENCOUNTER — Other Ambulatory Visit (HOSPITAL_COMMUNITY): Payer: Self-pay

## 2017-08-30 ENCOUNTER — Inpatient Hospital Stay (HOSPITAL_COMMUNITY)
Admission: EM | Disposition: A | Discharge status: Home / Self Care | Payer: Self-pay | Source: Home / Self Care | Attending: Internal Medicine

## 2017-08-30 ENCOUNTER — Encounter (HOSPITAL_COMMUNITY): Payer: Self-pay

## 2017-08-30 ENCOUNTER — Inpatient Hospital Stay (HOSPITAL_COMMUNITY): Payer: Medicaid Other

## 2017-08-30 HISTORY — PX: RIGHT HEART CATH: CATH118263

## 2017-08-30 LAB — POCT I-STAT 3, VENOUS BLOOD GAS (G3P V)
ACID-BASE DEFICIT: 1 mmol/L (ref 0.0–2.0)
Acid-base deficit: 1 mmol/L (ref 0.0–2.0)
BICARBONATE: 24.5 mmol/L (ref 20.0–28.0)
BICARBONATE: 24.6 mmol/L (ref 20.0–28.0)
O2 SAT: 47 %
O2 Saturation: 49 %
PCO2 VEN: 42.3 mmHg — AB (ref 44.0–60.0)
PH VEN: 7.373 (ref 7.250–7.430)
PO2 VEN: 26 mmHg — AB (ref 32.0–45.0)
PO2 VEN: 27 mmHg — AB (ref 32.0–45.0)
TCO2: 26 mmol/L (ref 22–32)
TCO2: 26 mmol/L (ref 22–32)
pCO2, Ven: 42.2 mmHg — ABNORMAL LOW (ref 44.0–60.0)
pH, Ven: 7.372 (ref 7.250–7.430)

## 2017-08-30 LAB — COOXEMETRY PANEL
CARBOXYHEMOGLOBIN: 2 % — AB (ref 0.5–1.5)
Carboxyhemoglobin: 1.8 % — ABNORMAL HIGH (ref 0.5–1.5)
METHEMOGLOBIN: 0.9 % (ref 0.0–1.5)
Methemoglobin: 1.3 % (ref 0.0–1.5)
O2 SAT: 58 %
O2 SAT: 56.7 %
Total hemoglobin: 12.4 g/dL (ref 12.0–16.0)
Total hemoglobin: 12.6 g/dL (ref 12.0–16.0)

## 2017-08-30 LAB — BASIC METABOLIC PANEL
ANION GAP: 7 (ref 5–15)
BUN: 31 mg/dL — ABNORMAL HIGH (ref 6–20)
CO2: 25 mmol/L (ref 22–32)
Calcium: 8.8 mg/dL — ABNORMAL LOW (ref 8.9–10.3)
Chloride: 98 mmol/L — ABNORMAL LOW (ref 101–111)
Creatinine, Ser: 1.14 mg/dL (ref 0.61–1.24)
GFR calc Af Amer: >60 mL/min (ref >60)
GLUCOSE: 128 mg/dL — AB (ref 65–99)
POTASSIUM: 5 mmol/L (ref 3.5–5.1)
Sodium: 130 mmol/L — ABNORMAL LOW (ref 135–145)

## 2017-08-30 LAB — CBC
HCT: 37.5 % — ABNORMAL LOW (ref 39.0–52.0)
HCT: 37.6 % — ABNORMAL LOW (ref 39.0–52.0)
HEMOGLOBIN: 12.3 g/dL — AB (ref 13.0–17.0)
Hemoglobin: 12.2 g/dL — ABNORMAL LOW (ref 13.0–17.0)
MCH: 29 pg (ref 26.0–34.0)
MCH: 28.9 pg (ref 26.0–34.0)
MCHC: 32.5 g/dL (ref 30.0–36.0)
MCHC: 32.7 g/dL (ref 30.0–36.0)
MCV: 89.3 fL (ref 78.0–100.0)
MCV: 88.3 fL (ref 78.0–100.0)
PLATELETS: 254 10*3/uL (ref 150–400)
PLATELETS: 244 10*3/uL (ref 150–400)
RBC: 4.2 MIL/uL — AB (ref 4.22–5.81)
RBC: 4.26 MIL/uL (ref 4.22–5.81)
RDW: 13.6 % (ref 11.5–15.5)
RDW: 13.2 % (ref 11.5–15.5)
WBC: 10.1 10*3/uL (ref 4.0–10.5)
WBC: 9.2 10*3/uL (ref 4.0–10.5)

## 2017-08-30 LAB — TYPE AND SCREEN
ABO/RH(D): B POS
ANTIBODY SCREEN: NEGATIVE

## 2017-08-30 LAB — APTT: aPTT: 33 seconds (ref 24–36)

## 2017-08-30 LAB — HEMOGLOBIN A1C
HEMOGLOBIN A1C: 5.2 % (ref 4.8–5.6)
Mean Plasma Glucose: 102.54 mg/dL

## 2017-08-30 LAB — MAGNESIUM: Magnesium: 2.1 mg/dL (ref 1.7–2.4)

## 2017-08-30 LAB — LIPID PANEL
CHOL/HDL RATIO: 3.4 ratio
CHOLESTEROL: 111 mg/dL (ref 0–200)
HDL: 33 mg/dL — ABNORMAL LOW (ref >40)
LDL CALC: 70 mg/dL (ref 0–99)
Triglycerides: 38 mg/dL (ref <150)
VLDL: 8 mg/dL (ref 0–40)

## 2017-08-30 LAB — PROTIME-INR
INR: 1.14
PROTHROMBIN TIME: 14.6 s (ref 11.4–15.2)

## 2017-08-30 LAB — T4, FREE: FREE T4: 1.45 ng/dL — AB (ref 0.61–1.12)

## 2017-08-30 LAB — PREALBUMIN: PREALBUMIN: 14.5 mg/dL — AB (ref 18–38)

## 2017-08-30 LAB — URIC ACID: URIC ACID, SERUM: 8.9 mg/dL — AB (ref 4.4–7.6)

## 2017-08-30 LAB — TSH: TSH: 1.957 u[IU]/mL (ref 0.350–4.500)

## 2017-08-30 LAB — ABO/RH: ABO/RH(D): B POS

## 2017-08-30 LAB — ANTITHROMBIN III: AntiThromb III Func: 96 % (ref 75–120)

## 2017-08-30 LAB — LACTATE DEHYDROGENASE: LDH: 239 U/L — ABNORMAL HIGH (ref 98–192)

## 2017-08-30 SURGERY — RIGHT HEART CATH
Anesthesia: LOCAL

## 2017-08-30 MED ORDER — LIDOCAINE HCL (PF) 1 % IJ SOLN
INTRAMUSCULAR | Status: DC | PRN
  Administered 2017-08-30: 5 mL via INTRADERMAL

## 2017-08-30 MED ORDER — SODIUM CHLORIDE 0.9% FLUSH: 3.0000 mL | Freq: Two times a day (BID) | INTRAVENOUS | Status: DC

## 2017-08-30 MED ORDER — FENTANYL CITRATE (PF) 100 MCG/2ML IJ SOLN
INTRAMUSCULAR | Status: AC
  Filled 2017-08-30: qty 2

## 2017-08-30 MED ORDER — OXYCODONE HCL 5 MG PO TABS
5.0000 mg | ORAL_TABLET | Freq: Once | ORAL | Status: AC
  Administered 2017-08-30: 5 mg via ORAL
  Filled 2017-08-30: qty 1

## 2017-08-30 MED ORDER — MIDAZOLAM HCL 2 MG/2ML IJ SOLN
INTRAMUSCULAR | Status: AC
  Filled 2017-08-30: qty 2

## 2017-08-30 MED ORDER — HEPARIN (PORCINE) IN NACL 2-0.9 UNIT/ML-% IJ SOLN
INTRAMUSCULAR | Status: AC
  Filled 2017-08-30: qty 500

## 2017-08-30 MED ORDER — SODIUM CHLORIDE 0.9% FLUSH: 3.0000 mL | INTRAVENOUS | Status: DC | PRN

## 2017-08-30 MED ORDER — MILRINONE LACTATE IN DEXTROSE 20-5 MG/100ML-% IV SOLN
0.1250 ug/kg/min | INTRAVENOUS | Status: DC
  Administered 2017-08-30: 0.125 ug/kg/min via INTRAVENOUS
  Filled 2017-08-30: qty 100

## 2017-08-30 MED ORDER — LIDOCAINE HCL (PF) 1 % IJ SOLN
INTRAMUSCULAR | Status: AC
  Filled 2017-08-30: qty 30

## 2017-08-30 MED ORDER — BENZONATATE 100 MG PO CAPS
100.0000 mg | ORAL_CAPSULE | Freq: Three times a day (TID) | ORAL | Status: DC
  Administered 2017-08-30 – 2017-09-05 (×20): 100 mg via ORAL
  Filled 2017-08-30 (×21): qty 1

## 2017-08-30 MED ORDER — ONDANSETRON HCL 4 MG/2ML IJ SOLN: 4.0000 mg | Freq: Four times a day (QID) | INTRAMUSCULAR | Status: DC | PRN

## 2017-08-30 MED ORDER — SODIUM CHLORIDE 0.9 % IV SOLN: 250.0000 mL | INTRAVENOUS | Status: DC | PRN

## 2017-08-30 MED ORDER — SODIUM CHLORIDE 0.9 % IV SOLN: INTRAVENOUS | Status: DC

## 2017-08-30 MED ORDER — ENOXAPARIN SODIUM 40 MG/0.4ML ~~LOC~~ SOLN
40.0000 mg | SUBCUTANEOUS | Status: DC
  Administered 2017-08-31 – 2017-09-01 (×2): 40 mg via SUBCUTANEOUS
  Filled 2017-08-30 (×2): qty 0.4

## 2017-08-30 MED ORDER — HEPARIN (PORCINE) IN NACL 2-0.9 UNIT/ML-% IJ SOLN
INTRAMUSCULAR | Status: AC | PRN
  Administered 2017-08-30 (×2): 500 mL

## 2017-08-30 MED ORDER — GUAIFENESIN ER 600 MG PO TB12
600.0000 mg | ORAL_TABLET | Freq: Two times a day (BID) | ORAL | Status: DC
  Administered 2017-08-30 – 2017-09-05 (×14): 600 mg via ORAL
  Filled 2017-08-30 (×14): qty 1

## 2017-08-30 MED ORDER — MIDAZOLAM HCL 2 MG/2ML IJ SOLN
INTRAMUSCULAR | Status: DC | PRN
  Administered 2017-08-30 (×2): 1 mg via INTRAVENOUS

## 2017-08-30 MED ORDER — FENTANYL CITRATE (PF) 100 MCG/2ML IJ SOLN
INTRAMUSCULAR | Status: DC | PRN
  Administered 2017-08-30 (×2): 25 ug via INTRAVENOUS

## 2017-08-30 MED ORDER — MILRINONE LACTATE IN DEXTROSE 20-5 MG/100ML-% IV SOLN
0.2500 ug/kg/min | INTRAVENOUS | Status: DC
  Administered 2017-08-31 – 2017-09-03 (×6): 0.25 ug/kg/min via INTRAVENOUS
  Filled 2017-08-30 (×7): qty 100

## 2017-08-30 MED ORDER — ASPIRIN 81 MG PO CHEW: 81.0000 mg | CHEWABLE_TABLET | ORAL | Status: DC

## 2017-08-30 MED ORDER — ACETAMINOPHEN 325 MG PO TABS: 650.0000 mg | ORAL_TABLET | ORAL | Status: DC | PRN

## 2017-08-30 SURGICAL SUPPLY — 12 items
CATH SWAN GANZ VIP 7.5F (CATHETERS) ×1 IMPLANT
COVER PRB 48X5XTLSCP FOLD TPE (BAG) IMPLANT
COVER PROBE 5X48 (BAG) ×2
KIT HEART LEFT (KITS) ×1 IMPLANT
PACK CARDIAC CATHETERIZATION (CUSTOM PROCEDURE TRAY) ×2 IMPLANT
PROTECTION STATION PRESSURIZED (MISCELLANEOUS) ×2
SHEATH AVANTI 11CM 8FR (SHEATH) ×1 IMPLANT
SLEEVE REPOSITIONING LENGTH 30 (MISCELLANEOUS) ×1 IMPLANT
STATION PROTECTION PRESSURIZED (MISCELLANEOUS) IMPLANT
TRANSDUCER W/STOPCOCK (MISCELLANEOUS) ×2 IMPLANT
TUBING ART PRESS 72  MALE/FEM (TUBING) ×1
TUBING ART PRESS 72 MALE/FEM (TUBING) IMPLANT

## 2017-08-30 NOTE — Plan of Care (Signed)
Discussed with patient current disease process and plan of care. Patient verbalizes understanding and is actively involved in treatment plan. Patient is able to manage basic ADLs and has been educated on current medication regimen and future tests and treatments.   Problem: Health Behavior/Discharge Planning: Goal: Ability to manage health-related needs will improve Outcome: Progressing   Problem: Clinical Measurements: Goal: Ability to maintain clinical measurements within normal limits will improve Outcome: Progressing Goal: Will remain free from infection Outcome: Progressing Goal: Diagnostic test results will improve Outcome: Progressing Goal: Respiratory complications will improve Outcome: Progressing Goal: Cardiovascular complication will be avoided Outcome: Progressing   Problem: Activity: Goal: Risk for activity intolerance will decrease Outcome: Progressing   Problem: Elimination: Goal: Will not experience complications related to bowel motility Outcome: Progressing Goal: Will not experience complications related to urinary retention Outcome: Progressing   Problem: Safety: Goal: Ability to remain free from injury will improve Outcome: Progressing   Problem: Skin Integrity: Goal: Risk for impaired skin integrity will decrease Outcome: Progressing   Problem: Education: Goal: Ability to demonstrate management of disease process will improve Outcome: Progressing Goal: Ability to verbalize understanding of medication therapies will improve Outcome: Progressing   Problem: Activity: Goal: Capacity to carry out activities will improve Outcome: Progressing   Problem: Cardiac: Goal: Ability to achieve and maintain adequate cardiopulmonary perfusion will improve Outcome: Progressing   Problem: Education: Goal: Understanding of CV disease, CV risk reduction, and recovery process will improve Outcome: Progressing   Problem: Activity: Goal: Ability to return to  baseline activity level will improve Outcome: Progressing   Problem: Cardiovascular: Goal: Ability to achieve and maintain adequate cardiovascular perfusion will improve Outcome: Progressing   Problem: Health Behavior/Discharge Planning: Goal: Ability to safely manage health-related needs after discharge will improve Outcome: Progressing   Problem: Cardiovascular: Goal: Vascular access site(s) Level 0-1 will be maintained Outcome: Completed/Met

## 2017-08-30 NOTE — H&P (View-Only) (Signed)
Advanced Heart Failure Team Consult Note   Primary Physician: Patient, No Pcp Per PCP-Cardiologist:  Dr Algie Coffer  Reason for Consultation: Heart Failure.   HPI:    Jonathan Wood is seen today for evaluation of heart failure at the request of Dr Algie Coffer.   Jonathan Wood is 47 year old with a history of NICM, HTN, ETOH abuse, former cocaine 5 years ago, smoker, asthma, and chronic systolic heart failure. Drinks 1-2 beers a week.  Admitted 01/2017 at [facility] with A/C Systolic Heart Failure. Diuresed with IV lasix.     Prior to admit he was evaluated at [facility] with abdominal pain. CT/Hida scan negative.   Over the last few months he has been struggling with increased dyspnea with exertion. Has had a hard time paying the co-pay for  cardiology visits so he had missed several appointments. Says he has been taking his medications and was provided entresto samples by Dr Algie Coffer. He has been riding an Engineer, petroleum in the grocery store. He has been very stressed out taking care of his kids.   Admitted on 4/11 with RUQ pain and increased shortness of breath. On admit LFTs severely elevated, BNP 2500, and troponin 0.04. He has been diuresed with IV lasix and milrinone 0.125 mcg. Overall diuresed 27 pounds. He has been hypotensive. Todays CO-OX is 58%.   He is the primary care giver for 3 children. They are 35, 61, and 76 years old. Drinks every now and then. Works at [employer].   Echo 08/2017  EF 5-10 % RV mildly elevated.  Left ventricle: The cavity size was mildly to moderately dilated.   Wall thickness was normal. The estimated ejection fraction was in   the range of 5% to 10%. Diffuse hypokinesis. - Aortic valve: Valve area (VTI): 1.73 cm^2. Valve area (Vmax):   1.86 cm^2. - Mitral valve: There was mild regurgitation. - Left atrium: The atrium was moderately dilated. - Right ventricle: The ventricular septum is flattened in systole   consistent with RV pressure  overload. The cavity size was mildly   dilated. Systolic function was moderately to severely reduced. - Right atrium: The atrium was moderately dilated. - Atrial septum: No defect or patent foramen ovale was identified. - Inferior vena cava: The vessel was dilated. The respirophasic   diameter changes were blunted (< 50%), consistent with elevated   central venous pressure.  ECHO 2018 at [facility].  EF < 25% Moderate Jonathan RV normal  RHC/LHC 10/08/2016  CO/CI 3.7/1.7  Normal Coronaries.   Review of Systems: [y] = yes, [ ]  = no   General: Weight gain [ ] ; Weight loss [ ] ; Anorexia [ ] ; Fatigue [Y ]; Fever [ ] ; Chills [ ] ; Weakness [Y ]  Cardiac: Chest pain/pressure [ ] ; Resting SOB [ ] ; Exertional SOB [Y ]; Orthopnea [ ] ; Pedal Edema [ ] ; Palpitations [ ] ; Syncope [ ] ; Presyncope [ ] ; Paroxysmal nocturnal dyspnea[ ]   Pulmonary: Cough [ ] ; Wheezing[ ] ; Hemoptysis[ ] ; Sputum [ ] ; Snoring [ ]   GI: Vomiting[ ] ; Dysphagia[ ] ; Melena[ ] ; Hematochezia [ ] ; Heartburn[ ] ; Abdominal pain [ ] ; Constipation [ ] ; Diarrhea [ ] ; BRBPR [ ]   GU: Hematuria[ ] ; Dysuria [ ] ; Nocturia[ ]   Vascular: Pain in legs with walking [ ] ; Pain in feet with lying flat [ ] ; Non-healing sores [ ] ; Stroke [ ] ; TIA [ ] ; Slurred speech [ ] ;  Neuro: Headaches[ ] ; Vertigo[ ] ; Seizures[ ] ; Paresthesias[ ] ;Blurred vision [ ] ;  Diplopia [ ] ; Vision changes [ ]   Ortho/Skin: Arthritis [ ] ; Joint pain [ ] ; Muscle pain [ ] ; Joint swelling [ ] ; Back Pain [ ] ; Rash [ ]   Psych: Depression[Y ]; Anxiety[ ]   Heme: Bleeding problems [ ] ; Clotting disorders [ ] ; Anemia [ ]   Endocrine: Diabetes [ ] ; Thyroid dysfunction[ ]   Home Medications Prior to Admission medications   Medication Sig Start Date End Date Taking? Authorizing Provider  albuterol (PROVENTIL HFA;VENTOLIN HFA) 108 (90 Base) MCG/ACT inhaler Inhale into the lungs every 6 (six) hours as needed for wheezing or shortness of breath.   Yes [provider]  albuterol (PROVENTIL)  (2.5 MG/3ML) 0.083% nebulizer solution Take 3 mLs (2.5 mg total) by nebulization every 6 (six) hours as needed for wheezing or shortness of breath. 01/28/17  Yes Vanetta Mulders, MD  aspirin EC 81 MG tablet Take 81 mg by mouth daily.   Yes [provider]  carvedilol (COREG) 6.25 MG tablet Take 1 tablet (6.25 mg total) by mouth 2 (two) times daily with a meal. 10/10/16  Yes Orpah Cobb, MD  furosemide (LASIX) 80 MG tablet Take 2,400 mg by mouth 2 (two) times daily.   Yes [provider]  sacubitril-valsartan (ENTRESTO) 24-26 MG Take 1 tablet by mouth 2 (two) times daily. 10/10/16  Yes Orpah Cobb, MD  traZODone (DESYREL) 50 MG tablet Take 50 mg by mouth at bedtime.   Yes [provider]    Past Medical History: Past Medical History:  Diagnosis Date  . Asthma   . CHF (congestive heart failure) (HCC)    a. 09/2016: EF 20-25% with cath showing normal cors  . GERD (gastroesophageal reflux disease)   . History of hiatal hernia     Past Surgical History: Past Surgical History:  Procedure Laterality Date  . NASAL FRACTURE SURGERY  1987  . RIGHT/LEFT HEART CATH AND CORONARY ANGIOGRAPHY N/A 10/08/2016   Procedure: Right/Left Heart Cath and Coronary Angiography;  Surgeon: Orpah Cobb, MD;  Location: [facility] INVASIVE CV LAB;  Service: Cardiovascular;  Laterality: N/A;    Family History: Family History  Problem Relation Age of Onset  . Hypertension Father     Social History: Social History   Socioeconomic History  . Marital status: Legally Separated    Spouse name: Not on file  . Number of children: Not on file  . Years of education: Not on file  . Highest education level: Not on file  Occupational History  . Not on file  Social Needs  . Financial resource strain: Not on file  . Food insecurity:    Worry: Not on file    Inability: Not on file  . Transportation needs:    Medical: Not on file    Non-medical: Not on file  Tobacco Use  . Smoking status:  Current Every Day Smoker    Packs/day: 0.50    Years: 25.00    Pack years: 12.50    Types: Cigarettes  . Smokeless tobacco: Never Used  . Tobacco comment: quit 1 week  Substance and Sexual Activity  . Alcohol use: Yes    Comment: occas  . Drug use: No  . Sexual activity: Yes  Lifestyle  . Physical activity:    Days per week: Not on file    Minutes per session: Not on file  . Stress: Not on file  Relationships  . Social connections:    Talks on phone: Not on file    Gets together: Not on file  Attends religious service: Not on file    Active member of club or organization: Not on file    Attends meetings of clubs or organizations: Not on file    Relationship status: Not on file  Other Topics Concern  . Not on file  Social History Narrative  . Not on file    Allergies:  No Known Allergies  Objective:    Vital Signs:   Temp:  [97.9 F (36.6 C)-98.6 F (37 C)] 98.2 F (36.8 C) (04/16 0837) Pulse Rate:  [87-97] 94 (04/16 0459) Resp:  [14-23] 18 (04/16 0837) BP: (80-99)/(55-77) 99/77 (04/16 0837) SpO2:  [94 %-98 %] 96 % (04/16 0459) Weight:  [184 lb 9.6 oz (83.7 kg)] 184 lb 9.6 oz (83.7 kg) (04/16 0459) Last BM Date: 08/24/17  Weight change: Filed Weights   08/28/17 0517 08/29/17 0435 08/30/17 0459  Weight: 158 lb (71.7 kg) 186 lb 11.2 oz (84.7 kg) 184 lb 9.6 oz (83.7 kg)    Intake/Output:   Intake/Output Summary (Last 24 hours) at 08/30/2017 1019 Last data filed at 08/30/2017 0600 Gross per 24 hour  Intake 540 ml  Output 1450 ml  Net -910 ml      Physical Exam   CVP 5-6  General:   No resp difficulty HEENT: normal Neck: supple. JVP 5-6. Carotids 2+ bilat; no bruits. No lymphadenopathy or thyromegaly appreciated. Cor: PMI laterally displaced. Regular rate & rhythm. No rubs,  or murmurs. + S3  Lungs: clear Abdomen: soft, nontender, nondistended. No hepatosplenomegaly. No bruits or masses. Good bowel sounds. Extremities: no cyanosis, clubbing, rash,  edema. RUE PICC  Neuro: alert & orientedx3, cranial nerves grossly intact. moves all 4 extremities w/o difficulty. Affect pleasant   Telemetry   NSR 90s   EKG    NSR 93 bpm 4/14 with narrow QRS  Labs   Basic Metabolic Panel: Recent Labs  Lab 08/26/17 0347 08/27/17 0845 08/28/17 0431 08/28/17 2033 08/30/17 0518  NA 134* 130* 132* 131* 130*  K 3.7 3.6 4.0 4.6 5.0  CL 102 96* 95* 96* 98*  CO2 22 25 29 26 25   GLUCOSE 122* 105* 138* 94 128*  BUN 37* 32* 29* 23* 31*  CREATININE 1.52* 1.37* 1.26* 1.33* 1.14  CALCIUM 8.5* 8.6* 8.9 8.8* 8.8*  MG  --  2.1 2.3 2.3  --     Liver Function Tests: Recent Labs  Lab 08/25/17 1337 08/26/17 0347 08/28/17 0431  AST 224* 181* 101*  ALT 288* 270* 209*  ALKPHOS 92 96 100  BILITOT 1.7* 1.4* 1.5*  PROT 6.5 7.0 6.7  ALBUMIN 3.2* 3.2* 2.9*   Recent Labs  Lab 08/25/17 1337  LIPASE 25   No results for input(s): AMMONIA in the last 168 hours.  CBC: Recent Labs  Lab 08/25/17 1258 08/27/17 0845 08/28/17 0431 08/29/17 0500 08/30/17 0518  WBC 8.0 7.4 8.6 10.1 10.1  HGB 11.4* 11.0* 11.7* 12.0* 12.2*  HCT 35.8* 33.8* 35.7* 36.1* 37.5*  MCV 90.6 88.3 87.9 88.7 89.3  PLT 174 145* 182 175 254    Cardiac Enzymes: Recent Labs  Lab 08/25/17 1258 08/25/17 2231 08/26/17 0347 08/26/17 0859 08/28/17 1100  TROPONINI 0.04* 0.05* 0.05* 0.05* 0.04*    BNP: BNP (last 3 results) Recent Labs    12/07/16 1838 01/28/17 1412 08/25/17 1337  BNP 1,321.0* 2,506.0* 2,510.0*    ProBNP (last 3 results) No results for input(s): PROBNP in the last 8760 hours.   CBG: No results for input(s): GLUCAP in the last 168  hours.  Coagulation Studies: No results for input(s): LABPROT, INR in the last 72 hours.   Imaging    No results found.   Medications:     Current Medications: . aspirin EC  81 mg Oral Daily  . benzonatate  100 mg Oral TID  . Chlorhexidine Gluconate Cloth  6 each Topical Daily  . doxycycline  100 mg Oral Q12H   . enoxaparin (LOVENOX) injection  40 mg Subcutaneous Q24H  . furosemide  40 mg Intravenous BID  . guaiFENesin  600 mg Oral BID  . Living Better with Heart Failure Book   Does not apply Once  . methocarbamol  500 mg Oral TID  . polyethylene glycol  17 g Oral Daily  . potassium chloride  20 mEq Oral BID  . predniSONE  20 mg Oral Q breakfast  . sodium chloride flush  10-40 mL Intracatheter Q12H  . sodium chloride flush  3 mL Intravenous Q12H  . traZODone  50 mg Oral QHS     Infusions: . sodium chloride    . milrinone 0.125 mcg/kg/min (08/30/17 7829)       Patient Profile    Jonathan Wood is 47 year old with a history of NICM, HTN, ETOH abuse, smoker, asthma, and chronic systolic heart failure.   Assessment/Plan   1. A/C Systolic Heart Failure - NICM had LHC 2018 with normal cors.  ECHO this admit EF 5-10%. RV moderately reduced.  - Diagnosed with HF about 1 year ago. Functional decline over the last few months.  - Hard to know the cause. Check TSH today.  -Appears euvolemic. CVP 5 - CO-OX 58%. Continue milrinone 0.125 mcg.  -SBP soft. Hold off Arb/bb.  - Continue digoxin.  - Smoker- Not a candidate for transplant  - RHC today to further evaluate hemodynamics.  -Consult VAD coordinators to consent for possible LVAD. - Check Blood Type.  - needs palliative care consult for GOC.   2. Shock Liver Elevated LFTs on admit.  Repeat CMET   3. CKD Stage II Renal function stable.   4.  Tobacco Abuse  Counseled to stop smoking   5. ETOH  Counseled to stop drinking all together.   Medication concerns reviewed with patient and pharmacy team. Barriers identified: Yes no insurance.   Length of Stay: 5  Amy Clegg, NP  08/30/2017, 10:19 AM  Advanced Heart Failure Team Pager [phone] (M-F; 7a - 4p)  Please contact [facility] for night-coverage after hours (4p -7a ) and weekends on [email or web address]  Agree.  47 y/o male without significant PMHx. Has suffered from HF for  about a year. Now admitted with low-output and shock with shock liver and kidney. Clinically improved with milrinone. Has remote h/o cocaine use but no use for > 7 years. Drinks socially but no ETOH abuse. Smokes 1-2 packs of cigarettes per week. Cath 2018 with no CAD. Echo this admit with LVEF 5-10% and moderate to severe RV dysfunction (Personally reviewed)   Exam Tearful Lying flat  JVP 5-6 Cor laterally displaced +s3 Lungs CTA Ab soft NT/ND Ext warm no c/c/e   He is critically ill with profound cardiogenic shock due to severe NICM with biventricular dysfunction. Clinically improved on milrinone. Will need advanced therapies. Ideally would transfer for emergent transplant eval but recent tobacco use prohibits. Will take to cath lab today for swan placement to assess hemodynamics and see if RV will support LVAD. Have d/w VAD team. Currently uninsured. Will need to d/w SW.   CRITICAL  CARE Performed by: Arvilla Meres  Total critical care time: 45 minutes  Critical care time was exclusive of separately billable procedures and treating other patients.  Critical care was necessary to treat or prevent imminent or life-threatening deterioration.  Critical care was time spent personally by me (independent of midlevel providers or residents) on the following activities: development of treatment plan with patient and/or surrogate as well as nursing, discussions with consultants, evaluation of patient's response to treatment, examination of patient, obtaining history from patient or surrogate, ordering and performing treatments and interventions, ordering and review of laboratory studies, ordering and review of radiographic studies, pulse oximetry and re-evaluation of patient's condition.  Arvilla Meres, MD  1:36 PM

## 2017-08-30 NOTE — Progress Notes (Signed)
Ref: Patient, No Pcp Per   Subjective:  Breathing better. Blood pressure is stable and he is afebrile. Awaiting heart failure team recommendation.  Objective:  Vital Signs in the last 24 hours: Temp:  [97.9 F (36.6 C)-98.6 F (37 C)] 98.2 F (36.8 C) (04/16 0837) Pulse Rate:  [87-97] 94 (04/16 0459) Cardiac Rhythm: Normal sinus rhythm (04/16 0700) Resp:  [14-23] 18 (04/16 0837) BP: (80-99)/(55-77) 99/77 (04/16 0837) SpO2:  [94 %-98 %] 96 % (04/16 0459) Weight:  [83.7 kg (184 lb 9.6 oz)] 83.7 kg (184 lb 9.6 oz) (04/16 0459)  Physical Exam: BP Readings from Last 1 Encounters:  08/30/17 99/77     Wt Readings from Last 1 Encounters:  08/30/17 83.7 kg (184 lb 9.6 oz)    Weight change: -0.953 kg (-2 lb 1.6 oz) Body mass index is 23.7 kg/m. HEENT: [place]/AT, Eyes-Brown, PERL, EOMI, Conjunctiva-Pink, Sclera-Non-icteric Neck: No JVD, No bruit, Trachea midline. Lungs:  Clear, Bilateral. Cardiac:  Regular rhythm, normal S1 and S2, no S3. III/VI systolic murmur. Abdomen:  Soft, non-tender. BS present. Extremities:  No edema present. No cyanosis. No clubbing. CNS: AxOx3, Cranial nerves grossly intact, moves all 4 extremities.  Skin: Warm and dry.   Intake/Output from previous day: 04/15 0701 - 04/16 0700 In: 780 [P.O.:720; I.V.:60] Out: 1750 [Urine:1750]    Lab Results: BMET    Component Value Date/Time   NA 130 (L) 08/30/2017 0518   NA 131 (L) 08/28/2017 2033   NA 132 (L) 08/28/2017 0431   K 5.0 08/30/2017 0518   K 4.6 08/28/2017 2033   K 4.0 08/28/2017 0431   CL 98 (L) 08/30/2017 0518   CL 96 (L) 08/28/2017 2033   CL 95 (L) 08/28/2017 0431   CO2 25 08/30/2017 0518   CO2 26 08/28/2017 2033   CO2 29 08/28/2017 0431   GLUCOSE 128 (H) 08/30/2017 0518   GLUCOSE 94 08/28/2017 2033   GLUCOSE 138 (H) 08/28/2017 0431   BUN 31 (H) 08/30/2017 0518   BUN 23 (H) 08/28/2017 2033   BUN 29 (H) 08/28/2017 0431   CREATININE 1.14 08/30/2017 0518   CREATININE 1.33 (H) 08/28/2017 2033    CREATININE 1.26 (H) 08/28/2017 0431   CALCIUM 8.8 (L) 08/30/2017 0518   CALCIUM 8.8 (L) 08/28/2017 2033   CALCIUM 8.9 08/28/2017 0431   GFRNONAA >60 08/30/2017 0518   GFRNONAA >60 08/28/2017 2033   GFRNONAA >60 08/28/2017 0431   GFRAA >60 08/30/2017 0518   GFRAA >60 08/28/2017 2033   GFRAA >60 08/28/2017 0431   CBC    Component Value Date/Time   WBC 10.1 08/30/2017 0518   RBC 4.20 (L) 08/30/2017 0518   HGB 12.2 (L) 08/30/2017 0518   HCT 37.5 (L) 08/30/2017 0518   PLT 254 08/30/2017 0518   MCV 89.3 08/30/2017 0518   MCH 29.0 08/30/2017 0518   MCHC 32.5 08/30/2017 0518   RDW 13.6 08/30/2017 0518   LYMPHSABS 5.6 (H) 10/08/2016 0332   MONOABS 0.9 10/08/2016 0332   EOSABS 0.3 10/08/2016 0332   BASOSABS 0.0 10/08/2016 0332   HEPATIC Function Panel Recent Labs    08/25/17 1337 08/26/17 0347 08/28/17 0431  PROT 6.5 7.0 6.7   HEMOGLOBIN A1C No components found for: HGA1C,  MPG CARDIAC ENZYMES Lab Results  Component Value Date   TROPONINI 0.04 (HH) 08/28/2017   TROPONINI 0.05 (HH) 08/26/2017   TROPONINI 0.05 (HH) 08/26/2017   BNP No results for input(s): PROBNP in the last 8760 hours. TSH No results for input(s):  TSH in the last 8760 hours. CHOLESTEROL No results for input(s): CHOL in the last 8760 hours.  Scheduled Meds: . aspirin EC  81 mg Oral Daily  . Chlorhexidine Gluconate Cloth  6 each Topical Daily  . digoxin  0.25 mg Oral Daily  . doxycycline  100 mg Oral Q12H  . enoxaparin (LOVENOX) injection  40 mg Subcutaneous Q24H  . furosemide  40 mg Intravenous BID  . Living Better with Heart Failure Book   Does not apply Once  . methocarbamol  500 mg Oral TID  . polyethylene glycol  17 g Oral Daily  . potassium chloride  20 mEq Oral BID  . predniSONE  20 mg Oral Q breakfast  . sodium chloride flush  10-40 mL Intracatheter Q12H  . sodium chloride flush  3 mL Intravenous Q12H  . traZODone  50 mg Oral QHS   Continuous Infusions: . sodium chloride    .  milrinone     PRN Meds:.sodium chloride, acetaminophen, albuterol, guaiFENesin-dextromethorphan, nitroGLYCERIN, ondansetron (ZOFRAN) IV, sodium chloride flush, sodium chloride flush, traMADol  Assessment/Plan: Acute on chronic left heart failure Possible pneumonia PE ruled out Dilated cardiomyopathy H/O hypertension  Life-Vest per EP. Decrease milrinone by 50 %. Continue lanoxin.  Continue antibiotic. Resume Entresto tomorrow if blood pressure is holding good. Ambulate in room   LOS: 5 days    Orpah Cobb  MD  08/30/2017, 9:06 AM

## 2017-08-30 NOTE — Care Management Note (Signed)
Case Management Note  Patient Details  Name: Jonathan Wood MRN: [id number] Date of Birth: Jul 20, 1970  Subjective/Objective:   Pt admitted with SOB - HF                 Action/Plan:  PTA independent from home.  Pt confirmed he does not have insurance nor PCP.  Pt declined for CM to set up appt in a [place] clinic due to drive time associated with a PCP in [place].  Pt was only receptive to CM providing both [employer] and [employer]  information on AVS.  Neither [facility] nor [facility] had appts and pt is not within zip code parameters for [facility].  Per HF RN - pt will be monitored by HF SW and Pharm D for ongoing needs post discharge.  HF team to provide discharge medications.   Expected Discharge Date:                  Expected Discharge Plan:  Home/Self Care  In-House Referral:     Discharge planning Services  CM Consult  Post Acute Care Choice:    Choice offered to:     DME Arranged:    DME Agency:     HH Arranged:    HH Agency:     Status of Service:     If discussed at [employer] of Stay Meetings, dates discussed:    Additional Comments:  [name], RN 08/30/2017, 9:53 AM

## 2017-08-30 NOTE — Interval H&P Note (Signed)
History and Physical Interval Note:  08/30/2017 2:11 PM  Jonathan Wood  has presented today for surgery, with the diagnosis of hf  The various methods of treatment have been discussed with the patient and family. After consideration of risks, benefits and other options for treatment, the patient has consented to  Procedure(s): RIGHT HEART CATH (N/A) as a surgical intervention .  The patient's history has been reviewed, patient examined, no change in status, stable for surgery.  I have reviewed the patient's chart and labs.  Questions were answered to the patient's satisfaction.     Daniel Bensimhon

## 2017-08-30 NOTE — Progress Notes (Signed)
[facility] Progress Note  Patient: Jonathan Wood KZL:[id number]   PCP: Patient, No Pcp Per DOB: 03-Oct-1970   DOA: 08/25/2017   DOS: 08/30/2017   Date of Service: the patient was seen and examined on 08/30/2017  Subjective: No nausea no vomiting.  Musculoskeletal pain is still present but  Brief hospital course: Jonathan Wood a 46 y.o.malewith medical history significant ofnonischemic cardiomyopathy with ejection fraction of 20-25%, followed byDr. Alveta Heimlich. The patient reports compliance of medications. Over the past 4-5 days, he has had development of right upper quadrant pain, shortness of breath and cough.  He has also noted over a 10 pound weight gain at home with reported minimal urine output with Lasix over the last week.  He was noted to have elevated liver function tests and was admitted with what appears to be hepatic congestion secondary to acute on chronic systolic CHF decompensation.  Patient was initially admitted at [facility], cardiology was consulted, PICC line was placed, patient was started on IV Lasix infusion as well as IV Primacor drip.  Patient was transferred to [facility] to be evaluate by his primary cardiologist. Currently further plan is continue IV Lasix, continue Primacor per cardiology.  Assessment and Plan: 1.  Acute on chronic systolic CHF. Nonischemic cardia myopathy. Started on IV Lasix, transition to IV Lasix infusion. Starting 08/27/2017 patient is back on IV Lasix every 12 hours. Appreciate cardiology input. Continue monitoring. S/P PICC line, on IV Primacor drip. O2 saturation 72.5 on 08/28/2017 which is significantly better than presentation. Management per cardiology.  2.  Pleuritic chest pain. Elevated d-dimer. CAP CT angiogram rule out PE. Shows a possible atelectasis vs infiltrate in the left lingula area, correlated with pt;s pain location Likely pleurisy Add low dose prednisone and doxycycline for 5 days.  No taper needed. Schedule Robaxin, as needed tramadol.  3.  Elevated LFT. Trending down. Hepatic congestion from CHF. Continue management per cardiology. Avoid hepatotoxic medication.  4.  Essential hypertension. Blood pressure currently soft in the presence of Primacor drip. Holding [place], [address].  Diet: cardiac diet DVT Prophylaxis: subcutaneous Heparin  Advance goals of care discussion: full code  Family Communication: no family was present at bedside, at the time of interview.  Disposition:  Discharge to be determined. Currently transferred to cardiologist service with Dr. Algie Coffer as primary. Call us as needed for any medical consultation.  Thank you.  Consultants: cardiology Procedures: PICC line placement  Antibiotics: Anti-infectives (From admission, onward)   Start     Dose/Rate Route Frequency Ordered Stop   08/29/17 2200  doxycycline (VIBRA-TABS) tablet 100 mg     100 mg Oral Every 12 hours 08/29/17 1322 09/03/17 2159   08/29/17 1000  doxycycline (VIBRA-TABS) tablet 100 mg  Status:  Discontinued     100 mg Oral Every 12 hours 08/29/17 0802 08/29/17 1322       Objective: Physical Exam: Vitals:   08/29/17 2338 08/30/17 0459 08/30/17 0837 08/30/17 1154  BP: (!) 93/57 96/66 99/77  (!) 81/52  Pulse: 87 94    Resp: 18 17 18  (!) 26  Temp: 97.9 F (36.6 C) 98.3 F (36.8 C) 98.2 F (36.8 C) 98.1 F (36.7 C)  TempSrc: Oral Oral Oral Oral  SpO2: 98% 96%    Weight:  83.7 kg (184 lb 9.6 oz)    Height:        Intake/Output Summary (Last 24 hours) at 08/30/2017 1339 Last data filed at 08/30/2017 1154 Gross per 24 hour  Intake 540  ml  Output 2250 ml  Net -1710 ml   Filed Weights   08/28/17 0517 08/29/17 0435 08/30/17 0459  Weight: 71.7 kg (158 lb) 84.7 kg (186 lb 11.2 oz) 83.7 kg (184 lb 9.6 oz)   General: Alert, Awake and Oriented to Time, Place and Person. Appear in mild distress, affect appropriate Eyes: PERRL, Conjunctiva normal ENT: Oral Mucosa clear  moist. Neck: no JVD, no Abnormal Mass Or lumps Cardiovascular: S1 and S2 Present, no Murmur, Peripheral Pulses Present Respiratory: normal respiratory effort, Bilateral Air entry equal and Decreased, no use of accessory muscle, Clear to Auscultation, no Crackles, no wheezes Abdomen: Bowel Sound present, Soft and no tenderness, no hernia Skin: no redness, no Rash, no induration Extremities: no Pedal edema, no calf tenderness Neurologic: Grossly no focal neuro deficit. Bilaterally Equal motor strength  Data Reviewed: CBC: Recent Labs  Lab 08/25/17 1258 08/27/17 0845 08/28/17 0431 08/29/17 0500 08/30/17 0518  WBC 8.0 7.4 8.6 10.1 10.1  HGB 11.4* 11.0* 11.7* 12.0* 12.2*  HCT 35.8* 33.8* 35.7* 36.1* 37.5*  MCV 90.6 88.3 87.9 88.7 89.3  PLT 174 145* 182 175 254   Basic Metabolic Panel: Recent Labs  Lab 08/26/17 0347 08/27/17 0845 08/28/17 0431 08/28/17 2033 08/30/17 0518  NA 134* 130* 132* 131* 130*  K 3.7 3.6 4.0 4.6 5.0  CL 102 96* 95* 96* 98*  CO2 22 25 29 26 25   GLUCOSE 122* 105* 138* 94 128*  BUN 37* 32* 29* 23* 31*  CREATININE 1.52* 1.37* 1.26* 1.33* 1.14  CALCIUM 8.5* 8.6* 8.9 8.8* 8.8*  MG  --  2.1 2.3 2.3  --     Liver Function Tests: Recent Labs  Lab 08/25/17 1337 08/26/17 0347 08/28/17 0431  AST 224* 181* 101*  ALT 288* 270* 209*  ALKPHOS 92 96 100  BILITOT 1.7* 1.4* 1.5*  PROT 6.5 7.0 6.7  ALBUMIN 3.2* 3.2* 2.9*   Recent Labs  Lab 08/25/17 1337  LIPASE 25   No results for input(s): AMMONIA in the last 168 hours. Coagulation Profile: No results for input(s): INR, PROTIME in the last 168 hours. Cardiac Enzymes: Recent Labs  Lab 08/25/17 1258 08/25/17 2231 08/26/17 0347 08/26/17 0859 08/28/17 1100  TROPONINI 0.04* 0.05* 0.05* 0.05* 0.04*   BNP (last 3 results) No results for input(s): PROBNP in the last 8760 hours. CBG: No results for input(s): GLUCAP in the last 168 hours. Studies: No results found.  Scheduled Meds: . [START ON  08/31/2017] aspirin  81 mg Oral Pre-Cath  . aspirin EC  81 mg Oral Daily  . benzonatate  100 mg Oral TID  . Chlorhexidine Gluconate Cloth  6 each Topical Daily  . doxycycline  100 mg Oral Q12H  . enoxaparin (LOVENOX) injection  40 mg Subcutaneous Q24H  . guaiFENesin  600 mg Oral BID  . Living Better with Heart Failure Book   Does not apply Once  . methocarbamol  500 mg Oral TID  . polyethylene glycol  17 g Oral Daily  . predniSONE  20 mg Oral Q breakfast  . sodium chloride flush  10-40 mL Intracatheter Q12H  . sodium chloride flush  3 mL Intravenous Q12H  . sodium chloride flush  3 mL Intravenous Q12H  . traZODone  50 mg Oral QHS   Continuous Infusions: . sodium chloride    . sodium chloride    . sodium chloride    . milrinone 0.125 mcg/kg/min (08/30/17 0923)   PRN Meds: sodium chloride, sodium chloride, acetaminophen, albuterol, guaiFENesin-dextromethorphan,  nitroGLYCERIN, ondansetron (ZOFRAN) IV, sodium chloride flush, sodium chloride flush, sodium chloride flush, traMADol  Time spent: 25 minutes  Author: Lynden Oxford, MD [facility] Pager: [phone] 08/30/2017 1:39 PM  If 7PM-7AM, please contact night-coverage at [email or web address], password [facility]

## 2017-08-30 NOTE — Consult Note (Addendum)
  Advanced Heart Failure Team Consult Note   Primary Physician: Patient, No Pcp Per PCP-Cardiologist:  Dr Kadakia  Reason for Consultation: Heart Failure.   HPI:    Jonathan Wood is seen today for evaluation of heart failure at the request of Dr Kadakia.   Jonathan Wood is 46 year old with a history of NICM, HTN, ETOH abuse, former cocaine 5 years ago, smoker, asthma, and chronic systolic heart failure. Drinks 1-2 beers a week.  Admitted 01/2017 at [facility] with A/C Systolic Heart Failure. Diuresed with IV lasix.     Prior to admit he was evaluated at [facility] with abdominal pain. CT/Hida scan negative.   Over the last few months he has been struggling with increased dyspnea with exertion. Has had a hard time paying the co-pay for  cardiology visits so he had missed several appointments. Says he has been taking his medications and was provided entresto samples by Dr Kadakia. He has been riding an electric cart in the grocery store. He has been very stressed out taking care of his kids.   Admitted on 4/11 with RUQ pain and increased shortness of breath. On admit LFTs severely elevated, BNP 2500, and troponin 0.04. He has been diuresed with IV lasix and milrinone 0.125 mcg. Overall diuresed 27 pounds. He has been hypotensive. Todays CO-OX is 58%.   He is the primary care giver for 3 children. They are 15, 14, and 6 years old. Drinks every now and then. Works at [employer].   Echo 08/2017  EF 5-10 % RV mildly elevated.  Left ventricle: The cavity size was mildly to moderately dilated.   Wall thickness was normal. The estimated ejection fraction was in   the range of 5% to 10%. Diffuse hypokinesis. - Aortic valve: Valve area (VTI): 1.73 cm^2. Valve area (Vmax):   1.86 cm^2. - Mitral valve: There was mild regurgitation. - Left atrium: The atrium was moderately dilated. - Right ventricle: The ventricular septum is flattened in systole   consistent with RV pressure  overload. The cavity size was mildly   dilated. Systolic function was moderately to severely reduced. - Right atrium: The atrium was moderately dilated. - Atrial septum: No defect or patent foramen ovale was identified. - Inferior vena cava: The vessel was dilated. The respirophasic   diameter changes were blunted (< 50%), consistent with elevated   central venous pressure.  ECHO 2018 at [facility].  EF < 25% Moderate MR RV normal  RHC/LHC 10/08/2016  CO/CI 3.7/1.7  Normal Coronaries.   Review of Systems: [y] = yes, [ ] = no   General: Weight gain [ ]; Weight loss [ ]; Anorexia [ ]; Fatigue [Y ]; Fever [ ]; Chills [ ]; Weakness [Y ]  Cardiac: Chest pain/pressure [ ]; Resting SOB [ ]; Exertional SOB [Y ]; Orthopnea [ ]; Pedal Edema [ ]; Palpitations [ ]; Syncope [ ]; Presyncope [ ]; Paroxysmal nocturnal dyspnea[ ]  Pulmonary: Cough [ ]; Wheezing[ ]; Hemoptysis[ ]; Sputum [ ]; Snoring [ ]  GI: Vomiting[ ]; Dysphagia[ ]; Melena[ ]; Hematochezia [ ]; Heartburn[ ]; Abdominal pain [ ]; Constipation [ ]; Diarrhea [ ]; BRBPR [ ]  GU: Hematuria[ ]; Dysuria [ ]; Nocturia[ ]  Vascular: Pain in legs with walking [ ]; Pain in feet with lying flat [ ]; Non-healing sores [ ]; Stroke [ ]; TIA [ ]; Slurred speech [ ];  Neuro: Headaches[ ]; Vertigo[ ]; Seizures[ ]; Paresthesias[ ];Blurred vision [ ];   Diplopia [ ]; Vision changes [ ]  Ortho/Skin: Arthritis [ ]; Joint pain [ ]; Muscle pain [ ]; Joint swelling [ ]; Back Pain [ ]; Rash [ ]  Psych: Depression[Y ]; Anxiety[ ]  Heme: Bleeding problems [ ]; Clotting disorders [ ]; Anemia [ ]  Endocrine: Diabetes [ ]; Thyroid dysfunction[ ]  Home Medications Prior to Admission medications   Medication Sig Start Date End Date Taking? Authorizing Provider  albuterol (PROVENTIL HFA;VENTOLIN HFA) 108 (90 Base) MCG/ACT inhaler Inhale into the lungs every 6 (six) hours as needed for wheezing or shortness of breath.   Yes [provider]  albuterol (PROVENTIL)  (2.5 MG/3ML) 0.083% nebulizer solution Take 3 mLs (2.5 mg total) by nebulization every 6 (six) hours as needed for wheezing or shortness of breath. 01/28/17  Yes Wood, Scott, MD  aspirin EC 81 MG tablet Take 81 mg by mouth daily.   Yes [provider]  carvedilol (COREG) 6.25 MG tablet Take 1 tablet (6.25 mg total) by mouth 2 (two) times daily with a meal. 10/10/16  Yes Kadakia, Ajay, MD  furosemide (LASIX) 80 MG tablet Take 2,400 mg by mouth 2 (two) times daily.   Yes [provider]  sacubitril-valsartan (ENTRESTO) 24-26 MG Take 1 tablet by mouth 2 (two) times daily. 10/10/16  Yes Kadakia, Ajay, MD  traZODone (DESYREL) 50 MG tablet Take 50 mg by mouth at bedtime.   Yes [provider]    Past Medical History: Past Medical History:  Diagnosis Date  . Asthma   . CHF (congestive heart failure) (HCC)    a. 09/2016: EF 20-25% with cath showing normal cors  . GERD (gastroesophageal reflux disease)   . History of hiatal hernia     Past Surgical History: Past Surgical History:  Procedure Laterality Date  . NASAL FRACTURE SURGERY  1987  . RIGHT/LEFT HEART CATH AND CORONARY ANGIOGRAPHY N/A 10/08/2016   Procedure: Right/Left Heart Cath and Coronary Angiography;  Surgeon: Kadakia, Ajay, MD;  Location: [facility] INVASIVE CV LAB;  Service: Cardiovascular;  Laterality: N/A;    Family History: Family History  Problem Relation Age of Onset  . Hypertension Father     Social History: Social History   Socioeconomic History  . Marital status: Legally Separated    Spouse name: Not on file  . Number of children: Not on file  . Years of education: Not on file  . Highest education level: Not on file  Occupational History  . Not on file  Social Needs  . Financial resource strain: Not on file  . Food insecurity:    Worry: Not on file    Inability: Not on file  . Transportation needs:    Medical: Not on file    Non-medical: Not on file  Tobacco Use  . Smoking status:  Current Every Day Smoker    Packs/day: 0.50    Years: 25.00    Pack years: 12.50    Types: Cigarettes  . Smokeless tobacco: Never Used  . Tobacco comment: quit 1 week  Substance and Sexual Activity  . Alcohol use: Yes    Comment: occas  . Drug use: No  . Sexual activity: Yes  Lifestyle  . Physical activity:    Days per week: Not on file    Minutes per session: Not on file  . Stress: Not on file  Relationships  . Social connections:    Talks on phone: Not on file    Gets together: Not on file      Attends religious service: Not on file    Active member of club or organization: Not on file    Attends meetings of clubs or organizations: Not on file    Relationship status: Not on file  Other Topics Concern  . Not on file  Social History Narrative  . Not on file    Allergies:  No Known Allergies  Objective:    Vital Signs:   Temp:  [97.9 F (36.6 C)-98.6 F (37 C)] 98.2 F (36.8 C) (04/16 0837) Pulse Rate:  [87-97] 94 (04/16 0459) Resp:  [14-23] 18 (04/16 0837) BP: (80-99)/(55-77) 99/77 (04/16 0837) SpO2:  [94 %-98 %] 96 % (04/16 0459) Weight:  [184 lb 9.6 oz (83.7 kg)] 184 lb 9.6 oz (83.7 kg) (04/16 0459) Last BM Date: 08/24/17  Weight change: Filed Weights   08/28/17 0517 08/29/17 0435 08/30/17 0459  Weight: 158 lb (71.7 kg) 186 lb 11.2 oz (84.7 kg) 184 lb 9.6 oz (83.7 kg)    Intake/Output:   Intake/Output Summary (Last 24 hours) at 08/30/2017 1019 Last data filed at 08/30/2017 0600 Gross per 24 hour  Intake 540 ml  Output 1450 ml  Net -910 ml      Physical Exam   CVP 5-6  General:   No resp difficulty HEENT: normal Neck: supple. JVP 5-6. Carotids 2+ bilat; no bruits. No lymphadenopathy or thyromegaly appreciated. Cor: PMI laterally displaced. Regular rate & rhythm. No rubs,  or murmurs. + S3  Lungs: clear Abdomen: soft, nontender, nondistended. No hepatosplenomegaly. No bruits or masses. Good bowel sounds. Extremities: no cyanosis, clubbing, rash,  edema. RUE PICC  Neuro: alert & orientedx3, cranial nerves grossly intact. moves all 4 extremities w/o difficulty. Affect pleasant   Telemetry   NSR 90s   EKG    NSR 93 bpm 4/14 with narrow QRS  Labs   Basic Metabolic Panel: Recent Labs  Lab 08/26/17 0347 08/27/17 0845 08/28/17 0431 08/28/17 2033 08/30/17 0518  NA 134* 130* 132* 131* 130*  K 3.7 3.6 4.0 4.6 5.0  CL 102 96* 95* 96* 98*  CO2 22 25 29 26 25  GLUCOSE 122* 105* 138* 94 128*  BUN 37* 32* 29* 23* 31*  CREATININE 1.52* 1.37* 1.26* 1.33* 1.14  CALCIUM 8.5* 8.6* 8.9 8.8* 8.8*  MG  --  2.1 2.3 2.3  --     Liver Function Tests: Recent Labs  Lab 08/25/17 1337 08/26/17 0347 08/28/17 0431  AST 224* 181* 101*  ALT 288* 270* 209*  ALKPHOS 92 96 100  BILITOT 1.7* 1.4* 1.5*  PROT 6.5 7.0 6.7  ALBUMIN 3.2* 3.2* 2.9*   Recent Labs  Lab 08/25/17 1337  LIPASE 25   No results for input(s): AMMONIA in the last 168 hours.  CBC: Recent Labs  Lab 08/25/17 1258 08/27/17 0845 08/28/17 0431 08/29/17 0500 08/30/17 0518  WBC 8.0 7.4 8.6 10.1 10.1  HGB 11.4* 11.0* 11.7* 12.0* 12.2*  HCT 35.8* 33.8* 35.7* 36.1* 37.5*  MCV 90.6 88.3 87.9 88.7 89.3  PLT 174 145* 182 175 254    Cardiac Enzymes: Recent Labs  Lab 08/25/17 1258 08/25/17 2231 08/26/17 0347 08/26/17 0859 08/28/17 1100  TROPONINI 0.04* 0.05* 0.05* 0.05* 0.04*    BNP: BNP (last 3 results) Recent Labs    12/07/16 1838 01/28/17 1412 08/25/17 1337  BNP 1,321.0* 2,506.0* 2,510.0*    ProBNP (last 3 results) No results for input(s): PROBNP in the last 8760 hours.   CBG: No results for input(s): GLUCAP in the last 168   hours.  Coagulation Studies: No results for input(s): LABPROT, INR in the last 72 hours.   Imaging    No results found.   Medications:     Current Medications: . aspirin EC  81 mg Oral Daily  . benzonatate  100 mg Oral TID  . Chlorhexidine Gluconate Cloth  6 each Topical Daily  . doxycycline  100 mg Oral Q12H   . enoxaparin (LOVENOX) injection  40 mg Subcutaneous Q24H  . furosemide  40 mg Intravenous BID  . guaiFENesin  600 mg Oral BID  . Living Better with Heart Failure Book   Does not apply Once  . methocarbamol  500 mg Oral TID  . polyethylene glycol  17 g Oral Daily  . potassium chloride  20 mEq Oral BID  . predniSONE  20 mg Oral Q breakfast  . sodium chloride flush  10-40 mL Intracatheter Q12H  . sodium chloride flush  3 mL Intravenous Q12H  . traZODone  50 mg Oral QHS     Infusions: . sodium chloride    . milrinone 0.125 mcg/kg/min (08/30/17 0923)       Patient Profile    Jonathan Herald is 46 year old with a history of NICM, HTN, ETOH abuse, smoker, asthma, and chronic systolic heart failure.   Assessment/Plan   1. A/C Systolic Heart Failure - NICM had LHC 2018 with normal cors.  ECHO this admit EF 5-10%. RV moderately reduced.  - Diagnosed with HF about 1 year ago. Functional decline over the last few months.  - Hard to know the cause. Check TSH today.  -Appears euvolemic. CVP 5 - CO-OX 58%. Continue milrinone 0.125 mcg.  -SBP soft. Hold off Arb/bb.  - Continue digoxin.  - Smoker- Not a candidate for transplant  - RHC today to further evaluate hemodynamics.  -Consult VAD coordinators to consent for possible LVAD. - Check Blood Type.  - needs palliative care consult for GOC.   2. Shock Liver Elevated LFTs on admit.  Repeat CMET   3. CKD Stage II Renal function stable.   4.  Tobacco Abuse  Counseled to stop smoking   5. ETOH  Counseled to stop drinking all together.   Medication concerns reviewed with patient and pharmacy team. Barriers identified: Yes no insurance.   Length of Stay: 5  Amy Clegg, NP  08/30/2017, 10:19 AM  Advanced Heart Failure Team Pager [phone] (M-F; 7a - 4p)  Please contact [facility] for night-coverage after hours (4p -7a ) and weekends on [email or web address]  Agree.  46 y/o male without significant PMHx. Has suffered from HF for  about a year. Now admitted with low-output and shock with shock liver and kidney. Clinically improved with milrinone. Has remote h/o cocaine use but no use for > 7 years. Drinks socially but no ETOH abuse. Smokes 1-2 packs of cigarettes per week. Cath 2018 with no CAD. Echo this admit with LVEF 5-10% and moderate to severe RV dysfunction (Personally reviewed)   Exam Tearful Lying flat  JVP 5-6 Cor laterally displaced +s3 Lungs CTA Ab soft NT/ND Ext warm no c/c/e   He is critically ill with profound cardiogenic shock due to severe NICM with biventricular dysfunction. Clinically improved on milrinone. Will need advanced therapies. Ideally would transfer for emergent transplant eval but recent tobacco use prohibits. Will take to cath lab today for swan placement to assess hemodynamics and see if RV will support LVAD. Have d/w VAD team. Currently uninsured. Will need to d/w SW.   CRITICAL   CARE Performed by: Bensimhon, Daniel  Total critical care time: 45 minutes  Critical care time was exclusive of separately billable procedures and treating other patients.  Critical care was necessary to treat or prevent imminent or life-threatening deterioration.  Critical care was time spent personally by me (independent of midlevel providers or residents) on the following activities: development of treatment plan with patient and/or surrogate as well as nursing, discussions with consultants, evaluation of patient's response to treatment, examination of patient, obtaining history from patient or surrogate, ordering and performing treatments and interventions, ordering and review of laboratory studies, ordering and review of radiographic studies, pulse oximetry and re-evaluation of patient's condition.  Daniel Bensimhon, MD  1:36 PM    

## 2017-08-31 ENCOUNTER — Ambulatory Visit (HOSPITAL_COMMUNITY): Payer: Medicaid Other

## 2017-08-31 ENCOUNTER — Encounter (HOSPITAL_COMMUNITY): Payer: Self-pay | Admitting: Internal Medicine

## 2017-08-31 ENCOUNTER — Inpatient Hospital Stay (HOSPITAL_COMMUNITY): Payer: Medicaid Other

## 2017-08-31 DIAGNOSIS — Z7189 Other specified counseling: Secondary | ICD-10-CM

## 2017-08-31 DIAGNOSIS — Z515 Encounter for palliative care: Secondary | ICD-10-CM

## 2017-08-31 DIAGNOSIS — R570 Cardiogenic shock: Secondary | ICD-10-CM

## 2017-08-31 DIAGNOSIS — Z01810 Encounter for preprocedural cardiovascular examination: Secondary | ICD-10-CM

## 2017-08-31 DIAGNOSIS — I5023 Acute on chronic systolic (congestive) heart failure: Secondary | ICD-10-CM

## 2017-08-31 LAB — HEPATITIS B CORE ANTIBODY, IGM: Hep B C IgM: NEGATIVE

## 2017-08-31 LAB — URINALYSIS, ROUTINE W REFLEX MICROSCOPIC
Bilirubin Urine: NEGATIVE
Glucose, UA: 50 mg/dL — AB
Hgb urine dipstick: NEGATIVE
Ketones, ur: NEGATIVE mg/dL
Leukocytes, UA: NEGATIVE
Nitrite: NEGATIVE
Protein, ur: NEGATIVE mg/dL
Specific Gravity, Urine: 1.018 (ref 1.005–1.030)
pH: 5 (ref 5.0–8.0)

## 2017-08-31 LAB — RAPID URINE DRUG SCREEN, HOSP PERFORMED
Amphetamines: NOT DETECTED
Barbiturates: NOT DETECTED
Benzodiazepines: POSITIVE — AB
Cocaine: NOT DETECTED
Opiates: NOT DETECTED
Tetrahydrocannabinol: POSITIVE — AB

## 2017-08-31 LAB — CBC
HEMATOCRIT: 37.3 % — AB (ref 39.0–52.0)
HEMOGLOBIN: 11.9 g/dL — AB (ref 13.0–17.0)
MCH: 28.5 pg (ref 26.0–34.0)
MCHC: 31.9 g/dL (ref 30.0–36.0)
MCV: 89.2 fL (ref 78.0–100.0)
Platelets: 245 10*3/uL (ref 150–400)
RBC: 4.18 MIL/uL — AB (ref 4.22–5.81)
RDW: 13.5 % (ref 11.5–15.5)
WBC: 15.1 10*3/uL — AB (ref 4.0–10.5)

## 2017-08-31 LAB — BASIC METABOLIC PANEL
ANION GAP: 8 (ref 5–15)
BUN: 24 mg/dL — ABNORMAL HIGH (ref 6–20)
CHLORIDE: 101 mmol/L (ref 101–111)
CO2: 25 mmol/L (ref 22–32)
Calcium: 8.9 mg/dL (ref 8.9–10.3)
Creatinine, Ser: 1.12 mg/dL (ref 0.61–1.24)
GFR calc non Af Amer: >60 mL/min (ref >60)
GLUCOSE: 136 mg/dL — AB (ref 65–99)
Potassium: 4.9 mmol/L (ref 3.5–5.1)
Sodium: 134 mmol/L — ABNORMAL LOW (ref 135–145)

## 2017-08-31 LAB — PULMONARY FUNCTION TEST
FEF 25-75 PRE: 1.96 L/s
FEF2575-%Pred-Pre: 49 %
FEV1-%Pred-Pre: 52 %
FEV1-Pre: 2.09 L
FEV1FVC-%Pred-Pre: 92 %
FEV6-%Pred-Pre: 55 %
FEV6-Pre: 2.66 L
FEV6FVC-%Pred-Pre: 102 %
FVC-%Pred-Pre: 56 %
FVC-Pre: 2.79 L
PRE FEV1/FVC RATIO: 75 %
Pre FEV6/FVC Ratio: 100 %

## 2017-08-31 LAB — COOXEMETRY PANEL
CARBOXYHEMOGLOBIN: 1.5 % (ref 0.5–1.5)
CARBOXYHEMOGLOBIN: 2 % — AB (ref 0.5–1.5)
METHEMOGLOBIN: 0.9 % (ref 0.0–1.5)
Methemoglobin: 1.4 % (ref 0.0–1.5)
O2 SAT: 44.9 %
O2 SAT: 64.8 %
TOTAL HEMOGLOBIN: 12.4 g/dL (ref 12.0–16.0)
Total hemoglobin: 12.5 g/dL (ref 12.0–16.0)

## 2017-08-31 LAB — LUPUS ANTICOAGULANT PANEL
DRVVT: 33.3 s (ref 0.0–47.0)
PTT LA: 23.8 s (ref 0.0–51.9)

## 2017-08-31 LAB — HEPATITIS B SURFACE ANTIBODY, QUANTITATIVE: Hepatitis B-Post: <3.1 m[IU]/mL — ABNORMAL LOW (ref >9.9)

## 2017-08-31 LAB — HEPATITIS B SURFACE ANTIGEN: HEP B S AG: NEGATIVE

## 2017-08-31 LAB — MAGNESIUM: Magnesium: 2.3 mg/dL (ref 1.7–2.4)

## 2017-08-31 LAB — SURGICAL PCR SCREEN
MRSA, PCR: NEGATIVE
Staphylococcus aureus: NEGATIVE

## 2017-08-31 LAB — HEPATITIS C ANTIBODY: HCV Ab: <0.1 s/co ratio (ref 0.0–0.9)

## 2017-08-31 LAB — HIV ANTIBODY (ROUTINE TESTING W REFLEX): HIV Screen 4th Generation wRfx: NONREACTIVE

## 2017-08-31 MED ORDER — ALUM & MAG HYDROXIDE-SIMETH 200-200-20 MG/5ML PO SUSP
30.0000 mL | ORAL | Status: DC | PRN
  Administered 2017-08-31 – 2017-09-06 (×2): 30 mL via ORAL
  Filled 2017-08-31 (×2): qty 30

## 2017-08-31 MED ORDER — DIGOXIN 125 MCG PO TABS
0.2500 mg | ORAL_TABLET | Freq: Every day | ORAL | Status: DC
  Administered 2017-08-31 – 2017-09-01 (×2): 0.25 mg via ORAL
  Filled 2017-08-31 (×2): qty 2

## 2017-08-31 MED ORDER — SIMETHICONE 80 MG PO CHEW
80.0000 mg | CHEWABLE_TABLET | Freq: Four times a day (QID) | ORAL | Status: DC | PRN
  Administered 2017-09-06: 80 mg via ORAL
  Filled 2017-08-31: qty 1

## 2017-08-31 MED ORDER — SERTRALINE HCL 50 MG PO TABS
25.0000 mg | ORAL_TABLET | Freq: Every day | ORAL | Status: DC
  Administered 2017-08-31: 25 mg via ORAL
  Filled 2017-08-31 (×4): qty 1

## 2017-08-31 MED ORDER — ALPRAZOLAM 0.25 MG PO TABS
0.2500 mg | ORAL_TABLET | Freq: Three times a day (TID) | ORAL | Status: DC | PRN
  Administered 2017-08-31 – 2017-09-06 (×14): 0.25 mg via ORAL
  Filled 2017-08-31 (×15): qty 1

## 2017-08-31 MED ORDER — FUROSEMIDE 40 MG PO TABS
40.0000 mg | ORAL_TABLET | Freq: Every day | ORAL | Status: DC
  Administered 2017-08-31: 40 mg via ORAL
  Filled 2017-08-31: qty 1

## 2017-08-31 MED ORDER — NOREPINEPHRINE BITARTRATE 1 MG/ML IV SOLN
0.0000 ug/min | INTRAVENOUS | Status: DC
  Administered 2017-08-31: 2 ug/min via INTRAVENOUS
  Administered 2017-09-01 – 2017-09-03 (×3): 3 ug/min via INTRAVENOUS
  Administered 2017-09-04: 4 ug/min via INTRAVENOUS
  Administered 2017-09-06: 3 ug/min via INTRAVENOUS
  Filled 2017-08-31 (×8): qty 4

## 2017-08-31 MED FILL — Heparin Sod (Porcine)-NaCl IV Soln 1000 Unit/500ML-0.9%: INTRAVENOUS | Qty: 1000 | Status: AC

## 2017-08-31 NOTE — Care Management Note (Signed)
Case Management Note Previous CM note completed by [name], RN 08/30/2017, 9:53 AM   Patient Details  Name: JAISHAWN BUFFONE MRN: [id number] Date of Birth: 1970/06/16  Subjective/Objective:   Pt admitted with SOB - HF                 Action/Plan:  PTA independent from home.  Pt confirmed he does not have insurance nor PCP.  Pt declined for CM to set up appt in a [facility] due to drive time associated with a PCP in [place].  Pt was only receptive to CM providing both [employer] and [employer]  information on AVS.  Neither [facility] nor [facility] had appts and pt is not within zip code parameters for [facility].  Per HF RN - pt will be monitored by HF SW and Pharm D for ongoing needs post discharge.  HF team to provide discharge medications.  Additional CM follow up notes:  08/31/17- 1000- Leandra Vanderweele RN, CM- pt tx to [facility] post cath- Admitted with recurrent heart failure, cardiogenic shock placed on IV inotrope-milrinone Plan for evaluation for implantable VAD- CM to follow for transition of care needs.    Expected Discharge Date:                  Expected Discharge Plan:  Home/Self Care  In-House Referral:     Discharge planning Services  CM Consult  Post Acute Care Choice:    Choice offered to:     DME Arranged:    DME Agency:     HH Arranged:    HH Agency:     Status of Service:     If discussed at Long Length of Stay Meetings, dates discussed:    Discharge Disposition:   Additional Comments:  [name], RN 08/31/2017, 10:08 AM [phone] 4E Transition Care Coordinator 2H coverage

## 2017-08-31 NOTE — Progress Notes (Signed)
LVAD Initial Psychosocial Screening  Date/Time Initiated:  08/31/17 11am Referral Source:  Carlton Adam, VAD Coordinator Referral Reason:  LVAD Implantation Source of Information:  Pt., Jonathan Wood-cousin/best friend and chart review.  Demographics Name:  Jonathan Wood Address:  [address] [place] [address] Home phone:  n/a   Cell:  [phone] Marital Status: Legally Separated  Faith:  [employer] Primary Language: English SS:  [id number]   DOB:  08/13/1970  Medical & Follow-up Adherence to Medical regimen/INR checks: compliant  Medication adherence: compliant ( mostly samples from MD)  Physician/Clinic Appointment Attendance: compliant   Advance Directives: Do you have a Living Will or Medical POA? No  Would you like to complete a Living Will and Medical POA prior to surgery?  Yes Do you have Goals of Care? Yes  Have you had a consult with the Palliative Care Team at [facility]? Yes  Psychological Health Appearance:  In hospital gown Mental Status:  Alert, oriented Eye Contact:  Good Thought Content:  Coherent Speech:  Logical/coherent Mood:  Depressed, Anxious, Sad and Pleasant  Affect:  Appropriate to circumstance Insight:  Good Judgement: Unimpaired Interaction Style:  Engaged  Family/Social Information Who lives in your home? Name:   Relationship:   Jonathan Wood Daughter 14yo  DOB:06/07/03 Jonathan Wood      Son 6 yo  DOB: 02/28/11 Jonathan Wood  Step son  15yo  DOB: 12/25/01  Other family members/support persons in your life? Name:   Relationship:   Jonathan Wood  Daughter [address]  Mom Levering Aunt Jonathan Wood  Best Friend   Caregiving Needs Who is the primary caregiver? Jonathan Wood Health status:  good Do you drive? Yes   Do you work?  no Physical Limitations:  none Do you have other care giving responsibilities?  no Contact number: [phone]  Who is the secondary caregiver? Jonathan Wood (Mom) and Jonathan Wood Midwife) Health  status:  good Do you drive?  yes Do you work?  yes/no Physical Limitations:  none Do you have other care giving responsibilities?   none  Home Environment/Personal Care Do you have reliable phone service? Yes  If so, what is the number?  [phone] [employer] monthly plan Do you own or rent your home? rent Current mortgage/rent: $500 Number of steps into the home? 3 steps How many levels in the home? 2 levels Assistive devices in the home? none Electrical needs for LVAD (3 prong outlets)? yes Second hand smoke exposure in the home? Patient only smoker and does not plan to return to smoking post LVAD Travel distance from [facility]? 45 minutes  Community Are you active with community agencies/resources/homecare? No Agency Name: n/a Are you active in a church, synagogue, mosque or other faith based community? Yes Faith based institutions name: He reports he attends theree different churches What other sources do you have for spiritual support? none Are you active in any clubs or social organizations? none What do you do for fun?  Hobbies?  Interests? Play ball with my son and watch [employer] sports  Education/Work Information What is the last grade of school you completed? 12th Preferred method of learning?  Hands on Do you have any problems with reading or writing?  No Are you currently employed?  Yes  When were you last employed? Last day of work was Friday April 12th  Name of employer? 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 12 years  If you are not working, do you plan to return to work after VAD surgery? No not initially but ultimately like to get some kind of work post VAD. 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  Yes Maybe Did you serve in the military? No  If so, what branch? Other  Financial Information What is your  source of income? employment Do you have difficulty meeting your monthly expenses? Yes If yes, which ones? Varies at times How do you cope with this? Family/friends help out. Currently there is a go fund me account which has raised $4,000 Can you budget for the monthly cost for dressing supplies post procedure? Pending  Primary Health insurance:  Pending Medicaid Secondary Insurance: Prescription plan: n/a Pharmacy:  Uses samples What are your prescription co-pays? n/a Do you use mail order for your prescriptions?  No Have you ever had to refuse medication due to cost?  Yes MD gave samples Have you applied for Medicaid?  Pending Have you applied for Social Security Disability (SSI)  Pending Patient and children receive [phone] in Food Stamps monthly  Medical Information Briefly describe why you are here for evaluation: Patient reports he started with SOB, and coughing a year ago and has declined over the past year. Multiple hospitalizations Do you have a PCP or other medical provider? Patient, No Pcp Per Are you able to complete your ADL's? self Do you have a history of trauma, physical, emotional, or sexual abuse? no Do you have any family history of heart problems? No Father passed away when he was 4 and not sure cause of death. Do you smoke now or past usage? Yes   Last smoke was on Friday before admission Do you drink alcohol now or past usage? past usage    Quit date:  Was a social drinker 2 months ago Are you currently using illegal drugs or misuse of medication or past usage? Yes  Marijuana last used 2 weeks ago Have you ever been treated for substance abuse? No      If yes, where and when did you receive treatment?   Mental Health History How have you been feeling in the past year? Depressed Have you ever had any problems with depression, anxiety or other mental health issues? "just the last year between my separation and health issues" Do you see a counselor, psychiatrist or  therapist?  no If you are currently experiencing problems are you interested in talking with a professional? Yes Patient will meet with CSW during hospitalization and pending recovery will discuss outpatient options post LVAD implant. Have you or are you taking medications for anxiety/depression or any mental health concerns?  Not prior to admission but just started Xanax today  What are your coping strategies under stressful situations? Go to work and stay busy Are there any other stressors in your life? Challenges with step son Felicity Pellegrini and separation from wife  Have you had any past or current thoughts of suicide? In the past he admits to "wanted to jump off a bridge" and his best friend Burman Blacksmith stopped him and very supportive. How many hours do you sleep at night? Not many How is your appetite? Like to eat Would you be interested in attending the LVAD support group? Yes  PHQ2 Depression Scale: 6 PHQ9 Depression scale (if positive PHQ2 screen):   will complete at bedside tomorrow  Legal Do you currently have any legal  issues/problems?  He reports legal issues with the step son but none for himself Have you had any legal issues/problems in the past?  no Do you have a Durable POA?  no  Plan for VAD Implementation Do you know and understand what happens during the VAD surgery? Patient Verbalizes Understanding  of surgery and able to describe details What do you know about the risks and side effect associated with VAD surgery? Patient Verbalizes Understanding  of risks (infection, stroke and death) Explain what will happen right after surgery: Patient Verbalizes Understanding  of OR to ICU and will be intubated What is your plan for transportation for the first 8 weeks post-surgery? (Patients are not recommended to drive post-surgery for 8 weeks)  Driver:    Control and instrumentation engineer and Mom Do you have airbags in your vehicle?  There is a risk of discharging the device if the airbag were to deploy. What do you know  about your diet post-surgery? Patient Verbalizes Understanding  of Heart healthy How do you plan to monitor your medications, current and future?  Lost my pill box. CSW will provide new pill box  How do you plan to complete ADL's post-surgery?  Self and family caregivers Will it be difficult to ask for help from your caregivers?   no  Please explain what you hope will be improved about your life as a result of receiving the LVAD? Breathe better/more energy Please tell me your biggest concern or fear about living with the LVAD?  It may not work How do you cope with your concerns and fears?  cry Please explain your understanding of how their body will change?  "Next time I will find a woman who will love me for who I am." Are you worried about these changes? No I want to live Do you see any barriers to your surgery or follow-up? Insurance  Understanding of LVAD Patient states understanding of the following: Surgical procedures and risks, Electrical need for LVAD (3 prong outlets), Safety precautions with LVAD (water, etc.), LVAD daily self-care (dressing changes, computer check, extra supplies), Outpatient follow up (LVAD clinic appts, monitoring blood thinners) and Need for Emergency Planning  Discussed and Reviewed with Patient and Caregiver  Patient's current level of motivation to prepare for LVAD: very motivated Patient's present Level of Consent for LVAD: Yes I am ready to get this done   Education provided to patient/family/caregiver:   Caregiver role and responsibiltiy, Financial planning for LVAD, Role of Clinical Social Worker and Signs of Depression and Anxiety     Caregiver questions Please explain what you hope will be improved about your life and loved one's life as a result of receiving the LVAD?  He will have an improved will to live because he will feel stronger What is your biggest concern or fear about caregiving with an LVAD patient?  That something will happen What is  your plan for availability to provide care 24/7 x2 weeks post op and dressing changes ongoing?  Jonathan will be there 24/7 and denies any concerns.  Who is the relief/backup caregiver and what is their availability?  Mom and Aunt are both available Preferred method of learning? Hands on  Do you drive? Yes How do you handle stressful situations?  "I can deal" Do you think you can do this? Yes Is there anything that concerns about caregiving?  no Do you provide caregiving to anyone else? I have kids but they are self sufficent  Caregiver's current level of motivation to prepare  for LVAD: highly motivated and very supportive Caregiver's present level of consent for LVAD:  Ready  Clinical Interventions Needed:    CSW will monitor signs and symptoms of depression and assist with adjustment to life with an LVAD. CSW will refer patient for Advanced Directives to Palliative Care. Patient has a pending medicaid case as well as a pending SSD case. CSW will assist with follow up needed for determination of both Medicaid and SSD. Patient admits to use of tobacco and marijuana and agrees to a cessation programs post LVAD. CSW will assist with needed referrals to assure support and cessation of both substances. Patient admits to depressive symptoms over the past year due to multiple social stressors and scored a 6 on the PHQ-2. CSW will provide supportive counseling inpatient per patient request and refer for outpatient treatment post VAD if indicated at that time. CSW encouraged attendance with the LVAD Support Group to assist further with adjustment and post implant peer support.  Clinical Impressions/Recommendations:   Mr. Kaigler is 47 yo male who is separated and resides at home with two of his three children ([place] daughter Alma Friendly and Sheria Lang son 41 yo) and his step son Felicity Pellegrini 4 yo. He reports he anticipates divorce in June, 2019 and is unsure of the whereabouts of his ex-wife. He states he has been working  for [employer] for the past 12 years and at times would work a second job. He receives $642 in food stamps and children are on Medicaid.  He reports his best friend/cousin Jonathan will be the primary caregiver and his Mom and Celine Ahr will be the back ups. He appears to have very good support from family and friends who have started a go fund me account to assist him during this phase. Patient is hopeful to someday work again but unsure he will be able to return to [employer]. He is active with multiple church communities and enjoys playing ball with his 6yo son. He states his HF began a year ago with SOB and coughing. He has been struggling with multiple social stressors which he attributes to his health issues. He admits to tobacco use and last smoked on Friday. He states he has used marijuana and last use was 2 weeks ago. He denies any other substance use including alcohol. He states he has been depressed over the past year due to social and health stressors. He denies need for a counselor although agreed to meet with CSW for supportive counseling inpatient and address the depressive symptoms post VAD and refer for outpatient services as indicated. He scored a 6 on the PHQ-2. He started Xanax today per MD order. He hopes to breathe better and have more energy post VAD and stated multiple times "I want to live". Patient appears to have a good support system and verbalizes understanding of need for healthy living post VAD for success. He is agreeable to support referrals and cessation programs as indicated. Patient aware of additional follow up regarding disability and medicaid and verbalizes understanding of paperwork and need to inform CSW of updates. Despite additional follow up needed for insurance and disability this CSW feels strongly that patient appears to be appropriate for assistance and is in need of LVAD implantation. Lasandra Beech, LCSW, CCSW-[facility] [phone]   Marcy Siren, LCSW

## 2017-08-31 NOTE — Progress Notes (Signed)
Initial Nutrition Assessment  DOCUMENTATION CODES:   Not applicable  INTERVENTION:    Plan to order Juven Fruit Punch BID, each serving provides 80kcal and 14g of protein (amino acids glutamine and arginine) s/p surgery.   NUTRITION DIAGNOSIS:   Increased nutrient needs related to chronic illness as evidenced by estimated needs.  GOAL:   Patient will meet greater than or equal to 90% of their needs  MONITOR:   PO intake, Supplement acceptance, Weight trends, Labs  REASON FOR ASSESSMENT:   Consult LVAD Eval  ASSESSMENT:   Patient with PMH significant for polysubstance abuse, CHF with ejection fraction of 15-20%, s/p right/left heart cath, and cardiomegaly. Presented at [facility] with complaints of significant abdominal discomfort and shortness of breath. Pt found to have  acute on chronic CHF and volume overload despite chronically taking 240 mg lasix BID. Transferred to [facility] for cardiac care.    RD consulted for LVAD evaluation. Spoke with pt at beside who reports a loss in appetite 2 days PTA. States he typically consumes 3-4 meals each day that consist of cheese burgers, pizza, chicken nuggets, and grilled chicken salads. He typically goes to restaurants like [employer], [employer] (where he works), and [place] takeout. Pt's appetite is increasing back to baseline. Meal completions charted as 75-100% for the last three days. RD observed take out pizza at bedside.   RD provided "Heart Healthy Nutrition Therapy" handout from the Academy of Nutrition and Dietetics. Provided examples on ways to decrease sodium and fat intake in diet. Discouraged intake of processed foods and use of salt shaker. Encouraged fresh fruits and vegetables as well as whole grain sources of carbohydrates to maximize fiber intake.   Pt endorses a UBW of 210 lb. Records indicate pt weighed 200 lb 10/10/16 and 186 lb this admission. Unable to determine dry weight loss given fluctuating fluid status. Pt does  not report any changes in his dry weight. Nutrition-Focused physical exam completed.   Medications reviewed and include: 40 mg lasix, prednisone, milrinone, levophed Labs reviewed: Na 134 (L) PAB 14.8 (L)  NUTRITION - FOCUSED PHYSICAL EXAM:    Most Recent Value  Orbital Region  No depletion  Upper Arm Region  No depletion  Thoracic and Lumbar Region  Unable to assess  Buccal Region  No depletion  Temple Region  Mild depletion  Clavicle Bone Region  No depletion  Clavicle and Acromion Bone Region  No depletion  Scapular Bone Region  Unable to assess  Dorsal Hand  No depletion  Patellar Region  No depletion  Anterior Thigh Region  No depletion  Posterior Calf Region  No depletion  Edema (RD Assessment)  None     Diet Order:  Diet Heart Room service appropriate? Yes; Fluid consistency: Thin  EDUCATION NEEDS:   Education needs have been addressed115-120  Skin:  Skin Assessment: Skin Integrity Issues:  Last BM:  08/26/17  Height:   Ht Readings from Last 1 Encounters:  08/25/17 6\' 2"  (1.88 m)    Weight:   Wt Readings from Last 1 Encounters:  08/31/17 186 lb 11.7 oz (84.7 kg)    Ideal Body Weight:  86.4 kg  BMI:  Body mass index is 23.97 kg/m.  Estimated Nutritional Needs:   Kcal:  2300-2500 kcal  Protein:  115-125 g  Fluid:  >2.3 L/day    Vanessa Kick RD, LDN Clinical Nutrition Pager # - [phone]

## 2017-08-31 NOTE — Progress Notes (Signed)
[facility] EDUCATION NOTE:   VAD evaluation consent reviewed and signed by Jonathan Wood and designated caregiver Jonathan Wood.  Initial VAD teaching completed with pt and caregiver.   VAD educational packet including "HM III Patient Handbook", "HM III Left Ventricular Assist System" packet, and "[place] HM III Patient Education" reviewed in detail with me and left at bedside for continued reference.  All questions answered regarding VAD implant, hospital stay, and what to expect when discharged home living with a heart pump. Pt identified his cousin Jonathan Wood as his primary caregiver if this therapy should be deemed appropriate for [name].  Explained need for 24/7 care when pt is  discharged home due to sternal precautions, adaptation to living on support, emotional support, consistent and meticulous exit site care and management, medication adherence and high volume of follow up visits with the VAD Clinic after discharge;  both pt and caregiver verbalized understanding of above.   Explained that LVAD can be implanted for two indications in the setting of advanced left ventricular heart failure treatment:  1. Bridge to transplant - used for patients who cannot safely wait for heart transplant without this device.  Or   2. Destination therapy - used for patients until end of life or recovery of heart function.  Provided brief equipment overview and demonstration with HeartMate IIItraining loop including discussion on the following:  a) power module  b) system controller  c) universal Magazine features editor  d) battery clips  e) Batteries  f) Percutaneous lead   Demonstrated and discussed:  a) changing power source on system controller from tethered (power module) to untethered (battery) mode  b) changing power source on system controller from untethered (battery) to tethered (power module) mode  c) how to monitor battery life both on the system controller and on each individual  battery  d) changing batteries   Reviewed and supplied a copy of home inspection check list stressing that only three pronged grounded power outlets can be used for VAD equipment. Mr. Eckman confirmed home has electrical outlets that will support the equipment.   Identified the following lifestyle modifications while living on [facility]:   1. No driving for at least three months and then only if doctor gives permission to do so.  2. No tub baths while pump implanted, and shower only when doctor gives permission.  3. No swimming or submersion in water while implanted with pump.  4. No contact sports or engaging in jumping activities.  5. Always have a backup controller, charged spare batteries, and battery clips nearby at all times in case of emergency.  6. Call the doctor or hospital contact person if any change in how the pump sounds, feels, or works.  7. Plan to sleep only when connected to the power module.  8. Do not sleep on your stomach.  9. Keep a backup system controller, charged batteries, battery clips, and flashlight near you during sleep in case of electrical power outage.  10. Exit site care including dressing changes, monitoring for infection, and importance of keeping percutaneous lead stabilized at all times.    Reviewed pictures of VAD drive line, site care, dressing changes, and drive line stabilization including securement attachment device and abdominal binder. Discussed with pt and family that they will be required to purchase dressing supplies as long as patient has the VAD in place.   The patient understands that from this discussion it does not mean that they will receive the device, but that depends on an extensive  evaluation process. The patient is aware of the fact that if at anytime they want to stop the evaluation process they can.  All questions have been answered at this time and contact information was provided should they encounter any further  questions.  They are both agreeable at this time to the evaluation process and will move forward.   VAD team notified of evaluation. Orders placed, surgeons and social worker aware.  Total Session Time: 60 minutes  Carlton Adam, RN VAD Coordinator   Office: [phone] 24/7 VAD Pager: [phone]

## 2017-08-31 NOTE — Progress Notes (Addendum)
Advanced Heart Failure Rounding Note  PCP-Cardiologist: No primary care provider on file.   Subjective:   Yesterday milrinone increased post cath. Swan left in.   Currently on milrinone 0.25 mcg. CO-OX 45%.   Frustrated today. Wants to see his children. Denies SOB.  CVP 5-6  CO-OX 44%  PAP 59/35  CO 4.3 CI 2.1   RHC 4/16 On milrinone 0.125 mcg/kg/min RA = 6 RV = 43/10 PA = 42/21 (30) PCW = 21 Fick cardiac output/index = 3.8/1.8 Thermo CO/CI = 5.4/2.6 PVR = 1.8 Papi = 3.5 RA/PCWP = 0.29 Ao sat = 92% PA sat = 47%, 49%  Objective:   Weight Range: 186 lb 11.7 oz (84.7 kg) Body mass index is 23.97 kg/m.   Vital Signs:   Temp:  [97.3 F (36.3 C)-98.8 F (37.1 C)] 97.9 F (36.6 C) (04/17 0700) Pulse Rate:  [0-100] 94 (04/16 1700) Resp:  [0-78] 33 (04/17 0700) BP: (81-147)/(52-85) 106/81 (04/17 0700) SpO2:  [0 %-100 %] 91 % (04/17 0700) Weight:  [184 lb 8.4 oz (83.7 kg)-186 lb 11.7 oz (84.7 kg)] 186 lb 11.7 oz (84.7 kg) (04/17 0500) Last BM Date: 08/26/17  Weight change: Filed Weights   08/30/17 0459 08/30/17 1800 08/31/17 0500  Weight: 184 lb 9.6 oz (83.7 kg) 184 lb 8.4 oz (83.7 kg) 186 lb 11.7 oz (84.7 kg)    Intake/Output:   Intake/Output Summary (Last 24 hours) at 08/31/2017 0804 Last data filed at 08/31/2017 0500 Gross per 24 hour  Intake 1109.04 ml  Output 1501 ml  Net -391.96 ml      Physical Exam   CVP 6 General:  Tearful. No resp difficulty HEENT: Normal. RIJ swan.  Neck: Supple. JVP5-6 . Carotids 2+ bilat; no bruits. No lymphadenopathy or thyromegaly appreciated. Cor: PMI nondisplaced. Regular rate & rhythm. + S3  Lungs: Clear Abdomen: Soft, nontender, nondistended. No hepatosplenomegaly. No bruits or masses. Good bowel sounds. Extremities: No cyanosis, clubbing, rash, edema. RUE PICC  Neuro: Alert & orientedx3, cranial nerves grossly intact. moves all 4 extremities w/o difficulty. Affect pleasant   Telemetry  SR/ST 80-100s   EKG     n/a  Labs    CBC Recent Labs    08/30/17 1237 08/31/17 0309  WBC 9.2 15.1*  HGB 12.3* 11.9*  HCT 37.6* 37.3*  MCV 88.3 89.2  PLT 244 245   Basic Metabolic Panel Recent Labs    16/10/96 2033 08/30/17 0518 08/30/17 1237 08/31/17 0309  NA 131* 130*  --  134*  K 4.6 5.0  --  4.9  CL 96* 98*  --  101  CO2 26 25  --  25  GLUCOSE 94 128*  --  136*  BUN 23* 31*  --  24*  CREATININE 1.33* 1.14  --  1.12  CALCIUM 8.8* 8.8*  --  8.9  MG 2.3  --  2.1  --    Liver Function Tests No results for input(s): AST, ALT, ALKPHOS, BILITOT, PROT, ALBUMIN in the last 72 hours. No results for input(s): LIPASE, AMYLASE in the last 72 hours. Cardiac Enzymes Recent Labs    08/28/17 1100  TROPONINI 0.04*    BNP: BNP (last 3 results) Recent Labs    12/07/16 1838 01/28/17 1412 08/25/17 1337  BNP 1,321.0* 2,506.0* 2,510.0*    ProBNP (last 3 results) No results for input(s): PROBNP in the last 8760 hours.   D-Dimer Recent Labs    08/28/17 1100  DDIMER 3.66*   Hemoglobin A1C Recent Labs  08/30/17 1237  HGBA1C 5.2   Fasting Lipid Panel Recent Labs    08/30/17 1237  CHOL 111  HDL 33*  LDLCALC 70  TRIG 38  CHOLHDL 3.4   Thyroid Function Tests Recent Labs    08/30/17 1237  TSH 1.957    Other results:   Imaging    Dg Chest Port 1 View  Result Date: 08/30/2017 CLINICAL DATA:  Central line placement. EXAM: PORTABLE CHEST 1 VIEW COMPARISON:  Chest radiograph and CTA of the chest performed 08/28/2017 FINDINGS: The patient's right IJ Swan-Ganz catheter is noted ending at the right main pulmonary artery. The lungs are well-aerated. Mildly worsening left midlung airspace opacity is concerning for worsening pneumonia. There is no evidence of pleural effusion or pneumothorax. The cardiomediastinal silhouette is mildly enlarged. No acute osseous abnormalities are seen. IMPRESSION: 1. Right IJ Swan-Ganz catheter noted ending at the right main pulmonary artery. 2.  Mildly worsening left midlung pneumonia. 3. Mild cardiomegaly. Electronically Signed   By: Roanna Raider M.D.   On: 08/30/2017 21:27      Medications:     Scheduled Medications: . aspirin EC  81 mg Oral Daily  . benzonatate  100 mg Oral TID  . Chlorhexidine Gluconate Cloth  6 each Topical Daily  . doxycycline  100 mg Oral Q12H  . enoxaparin (LOVENOX) injection  40 mg Subcutaneous Q24H  . guaiFENesin  600 mg Oral BID  . Living Better with Heart Failure Book   Does not apply Once  . methocarbamol  500 mg Oral TID  . polyethylene glycol  17 g Oral Daily  . predniSONE  20 mg Oral Q breakfast  . sodium chloride flush  10-40 mL Intracatheter Q12H  . sodium chloride flush  3 mL Intravenous Q12H  . traZODone  50 mg Oral QHS     Infusions: . sodium chloride 10 mL/hr at 08/31/17 0500  . milrinone 0.25 mcg/kg/min (08/31/17 0521)     PRN Medications:  sodium chloride, acetaminophen, albuterol, guaiFENesin-dextromethorphan, nitroGLYCERIN, ondansetron (ZOFRAN) IV, sodium chloride flush, sodium chloride flush, traMADol    Patient Profile   Ms Capek is 47 year old with a history of NICM, HTN, ETOH abuse, smoker, asthma, and chronic systolic heart failure.   Assessment/Plan  1. A/C Systolic Heart Failure - NICM had LHC 2018 with normal cors.  ECHO this admit EF 5-10%. RV moderately reduced.  - Diagnosed with HF about 1 year ago. Functional decline over the last few months.  -Volume status stable.  CVP 6. Add 40 mg po lasix.  - CO-OX 45%.  Continue milrinone 0.25 mcg. Add 3 mcg norepi.  - . Hold off Arb/bb.  - Continue digoxin.  - Smoker- Not a candidate for transplant  - Consult VAD coordinators to consent for possible LVAD. -HFSW to meet with him today. Needs inusurance.  - Blood Type B+  - needs palliative care consult for GOC.   2. Shock Liver Elevated LFTs on admit.  LFTs trending down.   3. CKD Stage II Stable.   4.  Tobacco Abuse  Counseled to stop smoking     5. ETOH  Counseled to stop drinking all together.   6. Gout Uric Acid 14.5 on steroids.   7. CAP On steroids + doxycyline.   8. ID:  WBC up to 15. No fever. On steroids. Watch closely. CBC in am.   9. Anxiety Add xanax and sertraline.   Continue to work up for advance therapies.     Medication concerns  reviewed with patient and pharmacy team. Barriers identified: yes no insurance.   Length of Stay: 6  Amy Clegg, NP  08/31/2017, 8:04 AM  Advanced Heart Failure Team Pager [phone] (M-F; 7a - 4p)  Please contact [facility] for night-coverage after hours (4p -7a ) and weekends on [email or web address]  Patient seen and examined with Tonye Becket, NP. We discussed all aspects of the encounter. I agree with the assessment and plan as stated above.   He remains in shock despite increased dose of milrinone. Volume status ok. Remains on abx for left lingular PNA. PapI on swan numbers better than I would have predicted by echo  On exam Fatigued appearing  RIJ swan  Cor RRR +s3 Lungs crackles at left base Ab soft NT Ext cool   He remains critically ill. I have d/w [facility] team. There is zero wiggle room in the 6 month requirement for tobacco-free period prior to transplant. Thus VAD is his only hope for survival. I also discussed with  VAD team and Dr. Donata Clay. Will post for tentative HM3 VAD for next Tuesday pending that we are able to get him approed for Medicaid. Discussed with SW personally and we will continue to work on it. If co-ox drops further will need IABP tomorrow. If not, likely plan for Friday to keep stable over the weekend.   CRITICAL CARE Performed by: Arvilla Meres  Total critical care time: 55 minutes  Critical care time was exclusive of separately billable procedures and treating other patients.  Critical care was necessary to treat or prevent imminent or life-threatening deterioration.  Critical care was time spent personally by me (independent  of midlevel providers or residents) on the following activities: development of treatment plan with patient and/or surrogate as well as nursing, discussions with consultants, evaluation of patient's response to treatment, examination of patient, obtaining history from patient or surrogate, ordering and performing treatments and interventions, ordering and review of laboratory studies, ordering and review of radiographic studies, pulse oximetry and re-evaluation of patient's condition.  Arvilla Meres, MD  10:04 PM

## 2017-08-31 NOTE — Consult Note (Signed)
Consultation Note Date: 08/31/2017   Patient Name: Jonathan Wood  DOB: 08-07-1970  MRN: [id number]  Age / Sex: 47 y.o., male  PCP: Patient, No Pcp Per Referring Physician: Dixie Dials, MD  Reason for Consultation: LVAD eval  HPI/Patient Profile: 47 y.o. male  with past medical history of NICM EF 10%, asthma, GERD, h/o hiatal hernia admitted on 08/25/2017 with RUQ pain, SOB with acute on chronic systolic heart failure. Current work up for potential LVAD.   Clinical Assessment and Goals of Care: I met today with Mr. Valvano. Mr. Dismore appears a little anxious and quickly shares with me that he has to live for his children. He has 4 children and 3 live with him. He says he is a single father and his children are his motivation to live. He is highly motivated for LVAD placement and even for future transplant.   He has a good support team with friend, Steffanie Dunn, and his mother and aunt at bedside speaking with CHF CSW. Mr. Loveland is very overwhelmed with LVAD information but again very motivated to proceed. He shares that he just wants to proceed and get this over with so he can return home. We discussed LVAD process and evaluation and patience with process. Discussed recovery and common issues such as insomnia and depression. He does share that he spent the past 2 days in tears but does not desire antidepressant at this time. I did explain that this is common and if he needs a little help then there is no shame in this. Right now he doesn't want to "take another pill" but says he will let us know if he needs antidepressant. He seems to have good basic understanding of LVAD at this time.   We also discussed Advance Directives and the importance of HCPOA (later informed by CSW that he is not yet legally divorced so will readdress importance of HCPOA documentation). He says his mother would be his decision maker  most likely. I provided him Advance Directive packet. We also discussed that although we are hopeful for good results sometimes things happen out of our control. I asked how he felt about dialysis, long term life support, feeding tubes. He would not want interventions to keep him alive if there is no hope for recovery so he can continue to care for his children... "if that's the case, I'm ready to be with Jesus."   Mr. Decou appears to be hopeful to proceed with LVAD with hopes of returning home to his children. He is a little anxious but appears to be coping fairly well.    Primary Decision Maker PATIENT    SUMMARY OF RECOMMENDATIONS   - Good LVAD candidate from palliative perspective - Will follow up tomorrow  Code Status/Advance Care Planning:  Full code   Symptom Management:   Per heart failure.   Palliative Prophylaxis:   Bowel Regimen and Frequent Pain Assessment  Additional Recommendations (Limitations, Scope, Preferences):  Full Scope Treatment  Psycho-social/Spiritual:   Desire for further Chaplaincy support:yes  Additional Recommendations: Caregiving  Support/Resources and Medicaid/Financial Assistance  Prognosis:   Unable to determine  Discharge Planning: To Be Determined      Primary Diagnoses: Present on Admission: . Acute on chronic systolic CHF (congestive heart failure) ([place]) . Congestive dilated cardiomyopathy ([place]) . Elevated LFTs . Hepatic congestion . RUQ pain . Acute on chronic systolic (congestive) heart failure ([place]) . Acute renal failure ([place]) . Cardiogenic shock ([place])   I have reviewed the medical record, interviewed the patient and family, and examined the patient. The following aspects are pertinent.  Past Medical History:  Diagnosis Date  . Asthma   . CHF (congestive heart failure) ([place])    a. 09/2016: EF 20-25% with cath showing normal cors  . GERD (gastroesophageal reflux disease)   . History of hiatal hernia    Social  History   Socioeconomic History  . Marital status: Legally Separated    Spouse name: Not on file  . Number of children: Not on file  . Years of education: Not on file  . Highest education level: Not on file  Occupational History  . Not on file  Social Needs  . Financial resource strain: Not on file  . Food insecurity:    Worry: Not on file    Inability: Not on file  . Transportation needs:    Medical: Not on file    Non-medical: Not on file  Tobacco Use  . Smoking status: Current Every Day Smoker    Packs/day: 0.50    Years: 25.00    Pack years: 12.50    Types: Cigarettes  . Smokeless tobacco: Never Used  . Tobacco comment: quit 1 week  Substance and Sexual Activity  . Alcohol use: Yes    Comment: occas  . Drug use: No  . Sexual activity: Yes  Lifestyle  . Physical activity:    Days per week: Not on file    Minutes per session: Not on file  . Stress: Not on file  Relationships  . Social connections:    Talks on phone: Not on file    Gets together: Not on file    Attends religious service: Not on file    Active member of club or organization: Not on file    Attends meetings of clubs or organizations: Not on file    Relationship status: Not on file  Other Topics Concern  . Not on file  Social History Narrative  . Not on file   Family History  Problem Relation Age of Onset  . Hypertension Father    Scheduled Meds: . aspirin EC  81 mg Oral Daily  . benzonatate  100 mg Oral TID  . Chlorhexidine Gluconate Cloth  6 each Topical Daily  . digoxin  0.25 mg Oral Daily  . doxycycline  100 mg Oral Q12H  . enoxaparin (LOVENOX) injection  40 mg Subcutaneous Q24H  . furosemide  40 mg Oral Daily  . guaiFENesin  600 mg Oral BID  . Living Better with Heart Failure Book   Does not apply Once  . methocarbamol  500 mg Oral TID  . polyethylene glycol  17 g Oral Daily  . predniSONE  20 mg Oral Q breakfast  . sertraline  25 mg Oral Daily  . sodium chloride flush  10-40 mL  Intracatheter Q12H  . sodium chloride flush  3 mL Intravenous Q12H  . traZODone  50 mg Oral QHS   Continuous Infusions: . sodium chloride 10 mL/hr at 08/31/17 1200  .  milrinone 0.25 mcg/kg/min (08/31/17 1200)  . norepinephrine (LEVOPHED) Adult infusion 2 mcg/min (08/31/17 1200)   PRN Meds:.sodium chloride, acetaminophen, albuterol, ALPRAZolam, guaiFENesin-dextromethorphan, nitroGLYCERIN, ondansetron (ZOFRAN) IV, sodium chloride flush, sodium chloride flush, traMADol No Known Allergies Review of Systems  Constitutional: Positive for activity change, appetite change and fatigue.  Respiratory: Negative for chest tightness and shortness of breath.   Neurological: Positive for weakness.    Physical Exam  Constitutional: He is oriented to person, place, and time. He appears well-developed. He appears ill.  HENT:  Head: Normocephalic and atraumatic.  Cardiovascular: Tachycardia present.  Pulmonary/Chest: Effort normal. No accessory muscle usage. No tachypnea. No respiratory distress.  Abdominal: Normal appearance.  Neurological: He is alert and oriented to person, place, and time.  Nursing note and vitals reviewed.   Vital Signs: BP 113/73   Pulse 94   Temp 98.4 F (36.9 C)   Resp 19   Ht 6' 2" (1.88 m)   Wt 84.7 kg (186 lb 11.7 oz)   SpO2 98%   BMI 23.97 kg/m  Pain Scale: 0-10 POSS *See Group Information*: 1-Acceptable,Awake and alert Pain Score: 0-No pain   SpO2: SpO2: 98 % O2 Device:SpO2: 98 % O2 Flow Rate: .O2 Flow Rate (L/min): 2 L/min  IO: Intake/output summary:   Intake/Output Summary (Last 24 hours) at 08/31/2017 1334 Last data filed at 08/31/2017 1200 Gross per 24 hour  Intake 1782.02 ml  Output 1301 ml  Net 481.02 ml    LBM: Last BM Date: 08/26/17 Baseline Weight: Weight: 95.7 kg (211 lb) Most recent weight: Weight: 84.7 kg (186 lb 11.7 oz)     Palliative Assessment/Data: 60%     Time Total: 50 min  Greater than 50%  of this time was spent  counseling and coordinating care related to the above assessment and plan.  Signed by: [name], NP Palliative Medicine Team Pager # [phone] (M-F 8a-5p) Team Phone # [phone] (Nights/Weekends)

## 2017-08-31 NOTE — Progress Notes (Signed)
Ref: Patient, No Pcp Per   Subjective:  Had Dr. Sheffield Slider consult for LVAD. Lower extremities [place] negative for DVT. On milrinone and norepinephrine drip. VS stable. Improving sodium level and afebrile.  Objective:  Vital Signs in the last 24 hours: Temp:  [97.3 F (36.3 C)-98.8 F (37.1 C)] 98.1 F (36.7 C) (04/17 1632) Cardiac Rhythm: Normal sinus rhythm (04/17 1227) Resp:  [12-36] 15 (04/17 1500) BP: (89-147)/(54-96) 119/85 (04/17 1500) SpO2:  [86 %-100 %] 96 % (04/17 1500) Weight:  [84.7 kg (186 lb 11.7 oz)] 84.7 kg (186 lb 11.7 oz) (04/17 0500)  Physical Exam: BP Readings from Last 1 Encounters:  08/31/17 119/85     Wt Readings from Last 1 Encounters:  08/31/17 84.7 kg (186 lb 11.7 oz)    Weight change: -0.034 kg (-1.2 oz) Body mass index is 23.97 kg/m. HEENT: [place]/AT, Eyes-Brown, PERL, EOMI, Conjunctiva-Pink, Sclera-Non-icteric Neck: No JVD, No bruit, Trachea midline. Lungs:  Clearing, Bilateral. Cardiac:  Regular rhythm, normal S1 and S2, no S3. II/VI systolic murmur. Abdomen:  Soft, non-tender. BS present. Extremities:  No edema present. No cyanosis. No clubbing. CNS: AxOx3, Cranial nerves grossly intact, moves all 4 extremities.  Skin: Warm and dry.   Intake/Output from previous day: 04/16 0701 - 04/17 0700 In: 1141.6 [P.O.:895; I.V.:246.6] Out: 1501 [Urine:1501]    Lab Results: BMET    Component Value Date/Time   NA 134 (L) 08/31/2017 0309   NA 130 (L) 08/30/2017 0518   NA 131 (L) 08/28/2017 2033   K 4.9 08/31/2017 0309   K 5.0 08/30/2017 0518   K 4.6 08/28/2017 2033   CL 101 08/31/2017 0309   CL 98 (L) 08/30/2017 0518   CL 96 (L) 08/28/2017 2033   CO2 25 08/31/2017 0309   CO2 25 08/30/2017 0518   CO2 26 08/28/2017 2033   GLUCOSE 136 (H) 08/31/2017 0309   GLUCOSE 128 (H) 08/30/2017 0518   GLUCOSE 94 08/28/2017 2033   BUN 24 (H) 08/31/2017 0309   BUN 31 (H) 08/30/2017 0518   BUN 23 (H) 08/28/2017 2033   CREATININE 1.12 08/31/2017 0309   CREATININE 1.14 08/30/2017 0518   CREATININE 1.33 (H) 08/28/2017 2033   CALCIUM 8.9 08/31/2017 0309   CALCIUM 8.8 (L) 08/30/2017 0518   CALCIUM 8.8 (L) 08/28/2017 2033   GFRNONAA >60 08/31/2017 0309   GFRNONAA >60 08/30/2017 0518   GFRNONAA >60 08/28/2017 2033   GFRAA >60 08/31/2017 0309   GFRAA >60 08/30/2017 0518   GFRAA >60 08/28/2017 2033   CBC    Component Value Date/Time   WBC 15.1 (H) 08/31/2017 0309   RBC 4.18 (L) 08/31/2017 0309   HGB 11.9 (L) 08/31/2017 0309   HCT 37.3 (L) 08/31/2017 0309   PLT 245 08/31/2017 0309   MCV 89.2 08/31/2017 0309   MCH 28.5 08/31/2017 0309   MCHC 31.9 08/31/2017 0309   RDW 13.5 08/31/2017 0309   LYMPHSABS 5.6 (H) 10/08/2016 0332   MONOABS 0.9 10/08/2016 0332   EOSABS 0.3 10/08/2016 0332   BASOSABS 0.0 10/08/2016 0332   HEPATIC Function Panel Recent Labs    08/25/17 1337 08/26/17 0347 08/28/17 0431  PROT 6.5 7.0 6.7   HEMOGLOBIN A1C No components found for: HGA1C,  MPG CARDIAC ENZYMES Lab Results  Component Value Date   TROPONINI 0.04 (HH) 08/28/2017   TROPONINI 0.05 (HH) 08/26/2017   TROPONINI 0.05 (HH) 08/26/2017   BNP No results for input(s): PROBNP in the last 8760 hours. TSH Recent Labs    08/30/17  1237  TSH 1.957   CHOLESTEROL Recent Labs    08/30/17 1237  CHOL 111    Scheduled Meds: . aspirin EC  81 mg Oral Daily  . benzonatate  100 mg Oral TID  . Chlorhexidine Gluconate Cloth  6 each Topical Daily  . digoxin  0.25 mg Oral Daily  . doxycycline  100 mg Oral Q12H  . enoxaparin (LOVENOX) injection  40 mg Subcutaneous Q24H  . furosemide  40 mg Oral Daily  . guaiFENesin  600 mg Oral BID  . Living Better with Heart Failure Book   Does not apply Once  . methocarbamol  500 mg Oral TID  . polyethylene glycol  17 g Oral Daily  . predniSONE  20 mg Oral Q breakfast  . sertraline  25 mg Oral Daily  . sodium chloride flush  10-40 mL Intracatheter Q12H  . sodium chloride flush  3 mL Intravenous Q12H  . traZODone   50 mg Oral QHS   Continuous Infusions: . sodium chloride 10 mL/hr at 08/31/17 1500  . milrinone 0.25 mcg/kg/min (08/31/17 1500)  . norepinephrine (LEVOPHED) Adult infusion 2 mcg/min (08/31/17 1500)   PRN Meds:.sodium chloride, acetaminophen, albuterol, ALPRAZolam, alum & mag hydroxide-simeth, guaiFENesin-dextromethorphan, nitroGLYCERIN, ondansetron (ZOFRAN) IV, simethicone, sodium chloride flush, sodium chloride flush, traMADol  Assessment/Plan: Acute on chronic left heart failure Possible pneumonia PE ruled out Dilated cardiomyopathy   Awaiting LVAD. Continue pressure agents.    LOS: 6 days    Orpah Cobb  MD  08/31/2017, 6:15 PM

## 2017-08-31 NOTE — Plan of Care (Addendum)
Patient educated about heart failure and need for LVAD.  Patient very tearful, family and friends in to support patient, patient remains mobile and sit on EOB, requiring minimal assistance. Discussed with patient cardiac healthy foods to choose to eat.  Instructed patient to avoid fried foods, processed foods, dietary in to speak to patient and give handout for heathy eating.

## 2017-08-31 NOTE — Consult Note (Signed)
[address]       [place] [address]             [phone]        Jonathan Wood [facility] Medical Record #[id number] Date of Birth: [birth year]  Referring: DR Bensimhon Primary Care: Patient, No Pcp Per Primary Cardiologist:No primary care provider on file.  Chief Complaint:    Chief Complaint  Patient presents with  . Shortness of Breath  Patient examined, echocardiogram and chest CT scan and cardiac catheterization images and hemodynamic data personally reviewed and counseled with patient  History of Present Illness:     47 year old AA nondiabetic male with nonischemic cardiomyopathy, EF 20%, treated with oral medications for the past year.  He was hospitalized at [facility] for heart failure within the past 6 months.  He was admitted for progressive heart failure on April 14 to this hospital.  The patient has past history of alcohol abuse, cocaine use, cigarette smoking, and acute on chronic systolic heart failure.  He has significant abdominal discomfort and shortness of breath as well as fatigue.  BNP was 2500 on admission.  His initial mixed venous saturation was 40% and he was placed on IV milrinone.  Mixed venous saturation now 45-50%.  He is in sinus rhythm.  Echo shows no evidence of LV thrombus with severe LV dilatation, moderate to severe RV dysfunction, mild TR, no AI.  No PFO.  CT scan of chest shows no significant thoracic aortic disease.  No significant pulmonary parenchymal disease with some atelectasis of the left lingula.  Patient still has PA catheter in place following right heart cath yesterday.  Cardiac index on milrinone 2.1 with PA pressures 45/25.  CVP has been low less than 10.  Endorgan function fairly well compensated with mild LFT elevation, normal creatinine, acid-base normal Patient in process of being evaluated for possible implantable LVAD for destination therapy indication.    Current Activity/ Functional  Status: Sedentary activity uses a cart to get around the grocery store   Zubrod Score: At the time of surgery this patient's most appropriate activity status/level should be described as: []     0    Normal activity, no symptoms []     1    Restricted in physical strenuous activity but ambulatory, able to do out light work []     2    Ambulatory and capable of self care, unable to do work activities, up and about                 more than 50%  Of the time                            [x]     3    Only limited self care, in bed greater than 50% of waking hours []     4    Completely disabled, no self care, confined to bed or chair []     5    Moribund  Past Medical History:  Diagnosis Date  . Asthma   . CHF (congestive heart failure) (HCC)    a. 09/2016: EF 20-25% with cath showing normal cors  . GERD (gastroesophageal reflux disease)   . History of hiatal hernia     Past Surgical History:  Procedure Laterality Date  . NASAL FRACTURE SURGERY  1987  . RIGHT HEART CATH N/A 08/30/2017   Procedure: RIGHT HEART CATH;  Surgeon: [name],  Bevelyn Buckles, MD;  Location: [facility] INVASIVE CV LAB;  Service: Cardiovascular;  Laterality: N/A;  . RIGHT/LEFT HEART CATH AND CORONARY ANGIOGRAPHY N/A 10/08/2016   Procedure: Right/Left Heart Cath and Coronary Angiography;  Surgeon: Orpah Cobb, MD;  Location: [facility] INVASIVE CV LAB;  Service: Cardiovascular;  Laterality: N/A;    Social History   Tobacco Use  Smoking Status Current Every Day Smoker  . Packs/day: 0.50  . Years: 25.00  . Pack years: 12.50  . Types: Cigarettes  Smokeless Tobacco Never Used  Tobacco Comment   quit 1 week    Social History   Substance and Sexual Activity  Alcohol Use Yes   Comment: occas    Social History   Socioeconomic History  . Marital status: Legally Separated    Spouse name: Not on file  . Number of children: Not on file  . Years of education: Not on file  . Highest education level: Not on file  Occupational History    . Not on file  Social Needs  . Financial resource strain: Not on file  . Food insecurity:    Worry: Not on file    Inability: Not on file  . Transportation needs:    Medical: Not on file    Non-medical: Not on file  Tobacco Use  . Smoking status: Current Every Day Smoker    Packs/day: 0.50    Years: 25.00    Pack years: 12.50    Types: Cigarettes  . Smokeless tobacco: Never Used  . Tobacco comment: quit 1 week  Substance and Sexual Activity  . Alcohol use: Yes    Comment: occas  . Drug use: No  . Sexual activity: Yes  Lifestyle  . Physical activity:    Days per week: Not on file    Minutes per session: Not on file  . Stress: Not on file  Relationships  . Social connections:    Talks on phone: Not on file    Gets together: Not on file    Attends religious service: Not on file    Active member of club or organization: Not on file    Attends meetings of clubs or organizations: Not on file    Relationship status: Not on file  . Intimate partner violence:    Fear of current or ex partner: Not on file    Emotionally abused: Not on file    Physically abused: Not on file    Forced sexual activity: Not on file  Other Topics Concern  . Not on file  Social History Narrative  . Not on file    No Known Allergies  Current Facility-Administered Medications  Medication Dose Route Frequency Provider Last Rate Last Dose  . 0.9 %  sodium chloride infusion  250 mL Intravenous PRN Bensimhon, Bevelyn Buckles, MD 10 mL/hr at 08/31/17 0800    . acetaminophen (TYLENOL) tablet 650 mg  650 mg Oral Q4H PRN Bensimhon, Bevelyn Buckles, MD   650 mg at 08/30/17 0830  . albuterol (PROVENTIL) (2.5 MG/3ML) 0.083% nebulizer solution 2.5 mg  2.5 mg Nebulization Q6H PRN Bensimhon, Bevelyn Buckles, MD      . ALPRAZolam Prudy Feeler) tablet 0.25 mg  0.25 mg Oral TID PRN Filbert Schilder, Amy D, NP   0.25 mg at 08/31/17 0933  . aspirin EC tablet 81 mg  81 mg Oral Daily Bensimhon, Bevelyn Buckles, MD   81 mg at 08/31/17 0933  . benzonatate  (TESSALON) capsule 100 mg  100 mg Oral TID Bensimhon, Bevelyn Buckles,  MD   100 mg at 08/31/17 0934  . Chlorhexidine Gluconate Cloth 2 % PADS 6 each  6 each Topical Daily Bensimhon, Bevelyn Buckles, MD   6 each at 08/30/17 [phone]  . digoxin (LANOXIN) tablet 0.25 mg  0.25 mg Oral Daily Clegg, Amy D, NP   0.25 mg at 08/31/17 0933  . doxycycline (VIBRA-TABS) tablet 100 mg  100 mg Oral Q12H Bensimhon, Bevelyn Buckles, MD   100 mg at 08/31/17 0934  . enoxaparin (LOVENOX) injection 40 mg  40 mg Subcutaneous Q24H Bensimhon, Bevelyn Buckles, MD   40 mg at 08/31/17 0933  . furosemide (LASIX) tablet 40 mg  40 mg Oral Daily Clegg, Amy D, NP   40 mg at 08/31/17 0934  . guaiFENesin (MUCINEX) 12 hr tablet 600 mg  600 mg Oral BID Bensimhon, Bevelyn Buckles, MD   600 mg at 08/31/17 0933  . guaiFENesin-dextromethorphan (ROBITUSSIN DM) 100-10 MG/5ML syrup 5 mL  5 mL Oral Q4H PRN Bensimhon, Bevelyn Buckles, MD   5 mL at 08/29/17 1750  . Living Better with Heart Failure Book   Does not apply Once Bensimhon, Bevelyn Buckles, MD      . methocarbamol (ROBAXIN) tablet 500 mg  500 mg Oral TID Bensimhon, Bevelyn Buckles, MD   500 mg at 08/31/17 0933  . milrinone (PRIMACOR) 20 MG/100 ML (0.2 mg/mL) infusion  0.25 mcg/kg/min Intravenous Continuous Bensimhon, Bevelyn Buckles, MD 6.3 mL/hr at 08/31/17 0800 0.25 mcg/kg/min at 08/31/17 0800  . nitroGLYCERIN (NITROSTAT) SL tablet 0.4 mg  0.4 mg Sublingual Q5 min PRN Bensimhon, Bevelyn Buckles, MD   0.4 mg at 08/28/17 0836  . norepinephrine (LEVOPHED) 4 mg in dextrose 5 % 250 mL (0.016 mg/mL) infusion  0-40 mcg/min Intravenous Titrated Clegg, Amy D, NP 7.5 mL/hr at 08/31/17 0929 2 mcg/min at 08/31/17 0929  . ondansetron (ZOFRAN) injection 4 mg  4 mg Intravenous Q6H PRN Bensimhon, Bevelyn Buckles, MD      . polyethylene glycol (MIRALAX / GLYCOLAX) packet 17 g  17 g Oral Daily Bensimhon, Bevelyn Buckles, MD   17 g at 08/31/17 0934  . predniSONE (DELTASONE) tablet 20 mg  20 mg Oral Q breakfast Bensimhon, Bevelyn Buckles, MD   20 mg at 08/31/17 9604  . sertraline (ZOLOFT) tablet  25 mg  25 mg Oral Daily Clegg, Amy D, NP   25 mg at 08/31/17 0934  . sodium chloride flush (NS) 0.9 % injection 10-40 mL  10-40 mL Intracatheter Q12H Bensimhon, Bevelyn Buckles, MD   10 mL at 08/30/17 2113  . sodium chloride flush (NS) 0.9 % injection 10-40 mL  10-40 mL Intracatheter PRN Bensimhon, Bevelyn Buckles, MD      . sodium chloride flush (NS) 0.9 % injection 3 mL  3 mL Intravenous Q12H Bensimhon, Bevelyn Buckles, MD   3 mL at 08/30/17 2114  . sodium chloride flush (NS) 0.9 % injection 3 mL  3 mL Intravenous PRN Bensimhon, Bevelyn Buckles, MD   3 mL at 08/29/17 0914  . traMADol (ULTRAM) tablet 50 mg  50 mg Oral Q6H PRN Bensimhon, Bevelyn Buckles, MD   50 mg at 08/31/17 0516  . traZODone (DESYREL) tablet 50 mg  50 mg Oral QHS Bensimhon, Bevelyn Buckles, MD   50 mg at 08/28/17 2151    Medications Prior to Admission  Medication Sig Dispense Refill Last Dose  . albuterol (PROVENTIL HFA;VENTOLIN HFA) 108 (90 Base) MCG/ACT inhaler Inhale into the lungs every 6 (six) hours as needed for wheezing or shortness of breath.  08/24/2017 at Unknown time  . albuterol (PROVENTIL) (2.5 MG/3ML) 0.083% nebulizer solution Take 3 mLs (2.5 mg total) by nebulization every 6 (six) hours as needed for wheezing or shortness of breath. 75 mL 12 08/24/2017 at Unknown time  . aspirin EC 81 MG tablet Take 81 mg by mouth daily.   08/25/2017 at Unknown time  . carvedilol (COREG) 6.25 MG tablet Take 1 tablet (6.25 mg total) by mouth 2 (two) times daily with a meal. 60 tablet 3 08/25/2017 at 0830  . furosemide (LASIX) 80 MG tablet Take 2,400 mg by mouth 2 (two) times daily.   08/25/2017 at Unknown time  . sacubitril-valsartan (ENTRESTO) 24-26 MG Take 1 tablet by mouth 2 (two) times daily. 60 tablet 3 08/25/2017 at Unknown time  . traZODone (DESYREL) 50 MG tablet Take 50 mg by mouth at bedtime.   08/24/2017 at Unknown time    Family History  Problem Relation Age of Onset  . Hypertension Father      Review of Systems:   ROS      Cardiac Review of Systems: Y or   [    ]= no  Chest Pain [    ]  Resting SOB [yes] Exertional SOB  [yes]  Orthopnea yes [  ]   Pedal Edema [yes]    Palpitations [  ] Syncope  [  ]   Presyncope [dizziness   ]  General Review of Systems: [Y] = yes [  ]=no Constitional: recent weight change [yes weight increasing]; anorexia [  ]; fatigue [  ]; nausea [  ]; night sweats [  ]; fever [  ]; or chills [  ]                                                               Dental: poor dentition[  ]; Last Dentist visit: One year  Eye : blurred vision [  ]; diplopia [   ]; vision changes [  ];  Amaurosis fugax[  ]; Resp: cough [dry cough];  wheezing[  ];  hemoptysis[  ]; shortness of breath[  ]; paroxysmal nocturnal dyspnea[  ]; dyspnea on exertion[BS]; or orthopnea[  ];  GI:  gallstones[  ], vomiting[  ];  dysphagia[  ]; melena[  ];  hematochezia [  ]; heartburn[  ];   Hx of  Colonoscopy[  ]; abdominal tightness GU: kidney stones [  ]; hematuria[  ];   dysuria [  ];  nocturia[  ];  history of     obstruction [  ]; urinary frequency [  ]             Skin: rash, swelling[  ];, hair loss[  ];  peripheral edema[  ];  or itching[  ]; Musculosketetal: myalgias[  ];  joint swelling[  ];  joint erythema[  ];  joint pain[  ];  back pain[  ];  Heme/Lymph: bruising[  ];  bleeding[  ];  anemia[  ];  Neuro: TIA[  ];  headaches[  ];  stroke[  ];  vertigo[  ];  seizures[  ];   paresthesias[  ];  difficulty walking[  ]; right-hand-dominant psych:depression[  ]; anxiety[  ];  Endocrine: diabetes[  ];  thyroid dysfunction[  ];  Immunizations: Flu [  ];  Pneumococcal[  ];  No previous surgery or general anesthesia or thoracic trauma.  Physical Exam: BP 106/79   Pulse 94   Temp 98.8 F (37.1 C) (Core)   Resp (!) 24   Ht 6\' 2"  (1.88 m)   Wt 186 lb 11.7 oz (84.7 kg)   SpO2 98%   BMI 23.97 kg/m        Physical Exam  General: Anxious middle-aged AA male HEENT: Normocephalic pupils equal , dentition adequate Neck: Supple without JVD, adenopathy, or  bruit Chest: Clear to auscultation, symmetrical breath sounds, no rhonchi, no tenderness             or deformity Cardiovascular: Regular rate and rhythm, no murmur, n+ S3 gallop, peripheral pulses             palpable in all extremities Abdomen:  Soft, nontender, no palpable mass or organomegaly Extremities: Warm, well-perfused, no clubbing cyanosis edema or tenderness,              no venous stasis changes of the legs Rectal/GU: Deferred Neuro: Grossly non--focal and symmetrical throughout Skin: Clean and dry without rash or ulceration   Diagnostic Studies & Laboratory data:     Recent Radiology Findings:   Dg Chest Port 1 View  Result Date: 08/30/2017 CLINICAL DATA:  Central line placement. EXAM: PORTABLE CHEST 1 VIEW COMPARISON:  Chest radiograph and CTA of the chest performed 08/28/2017 FINDINGS: The patient's right IJ Swan-Ganz catheter is noted ending at the right main pulmonary artery. The lungs are well-aerated. Mildly worsening left midlung airspace opacity is concerning for worsening pneumonia. There is no evidence of pleural effusion or pneumothorax. The cardiomediastinal silhouette is mildly enlarged. No acute osseous abnormalities are seen. IMPRESSION: 1. Right IJ Swan-Ganz catheter noted ending at the right main pulmonary artery. 2. Mildly worsening left midlung pneumonia. 3. Mild cardiomegaly. Electronically Signed   By: Roanna Raider M.D.   On: 08/30/2017 21:27     I have independently reviewed the above radiologic studies.  Recent Lab Findings: Lab Results  Component Value Date   WBC 15.1 (H) 08/31/2017   HGB 11.9 (L) 08/31/2017   HCT 37.3 (L) 08/31/2017   PLT 245 08/31/2017   GLUCOSE 136 (H) 08/31/2017   CHOL 111 08/30/2017   TRIG 38 08/30/2017   HDL 33 (L) 08/30/2017   LDLCALC 70 08/30/2017   ALT 209 (H) 08/28/2017   AST 101 (H) 08/28/2017   NA 134 (L) 08/31/2017   K 4.9 08/31/2017   CL 101 08/31/2017   CREATININE 1.12 08/31/2017   BUN 24 (H) 08/31/2017     CO2 25 08/31/2017   TSH 1.957 08/30/2017   INR 1.14 08/30/2017   HGBA1C 5.2 08/30/2017      Assessment / Plan:      Nonischemic cardiomyopathy with EF 15-20% Admitted with recurrent heart failure second hospitalization in 6 months Found to be in cardiogenic shock placed on IV inotrope-milrinone  agree with proceeding with evaluation for implantable VAD therapy. RV function is significantly impaired but remains compensated and should improve with afterload reduction accomplished with LVAD     @ME1 @ 08/31/2017 9:37 AM

## 2017-08-31 NOTE — Progress Notes (Signed)
   08/31/17 2133  Vitals  Temp 97.7 F (36.5 C)  ECG Heart Rate 89  Resp (!) 21  Oxygen Therapy  SpO2 98 %  Art Line (2)  Arterial Line BP 2 118/79  Arterial Line MAP (mmHg) 92 mmHg  Invasive Hemodynamic Monitoring  CVP (mmHg) 4 mmHg  PAP (Mean) 29 mmHg  PAP 43/18  CO (L/min) 4.1 L/min  CI (L/min/m2) 2 L/min/m2  PCWP (mmHg) 19 mmHg  SVR (dyne*sec)/cm5 1716 (dyne*sec)/cm5  SVRI (dyne*sec)/cm5 3604 (dyne*sec)/cm5  PVR (dyne*sec)/cm5 195 (dyne*sec)/cm5  PVRI (dyne*sec)/cm5 410 (dyne*sec)/cm5  SV (mL) 46.1 mL  LVSW 45.7  LVSWI 21.8  RVSW 15.66  RVSWI 7.46

## 2017-08-31 NOTE — Progress Notes (Signed)
Pre-VAD testing has been completed. 1-39% ICA stenosis bilaterally.  Bilateral lower extremity venous duplex has been completed. Negative for DVT.  ABI's  Right 1.14 Left 1.13  08/31/17 12:07 PM Olen Cordial RVT

## 2017-08-31 NOTE — Progress Notes (Signed)
50mg  Trazodone tablet refused by patient wasted in sharps with RN Hosie Spangle.

## 2017-09-01 DIAGNOSIS — Z7189 Other specified counseling: Secondary | ICD-10-CM

## 2017-09-01 DIAGNOSIS — Z515 Encounter for palliative care: Secondary | ICD-10-CM

## 2017-09-01 LAB — COOXEMETRY PANEL
Carboxyhemoglobin: 1.8 % — ABNORMAL HIGH (ref 0.5–1.5)
Carboxyhemoglobin: 1.7 % — ABNORMAL HIGH (ref 0.5–1.5)
METHEMOGLOBIN: 0.9 % (ref 0.0–1.5)
Methemoglobin: 1.5 % (ref 0.0–1.5)
O2 SAT: 62.6 %
O2 Saturation: 53.2 %
Total hemoglobin: 11.4 g/dL — ABNORMAL LOW (ref 12.0–16.0)
Total hemoglobin: 9.5 g/dL — ABNORMAL LOW (ref 12.0–16.0)

## 2017-09-01 LAB — COMPREHENSIVE METABOLIC PANEL
ALT: 76 U/L — ABNORMAL HIGH (ref 17–63)
AST: 27 U/L (ref 15–41)
Albumin: 2.8 g/dL — ABNORMAL LOW (ref 3.5–5.0)
Alkaline Phosphatase: 88 U/L (ref 38–126)
Anion gap: 5 (ref 5–15)
BUN: 25 mg/dL — ABNORMAL HIGH (ref 6–20)
CO2: 24 mmol/L (ref 22–32)
Calcium: 8.6 mg/dL — ABNORMAL LOW (ref 8.9–10.3)
Chloride: 103 mmol/L (ref 101–111)
Creatinine, Ser: 0.96 mg/dL (ref 0.61–1.24)
GFR calc Af Amer: >60 mL/min (ref >60)
GFR calc non Af Amer: >60 mL/min (ref >60)
Glucose, Bld: 118 mg/dL — ABNORMAL HIGH (ref 65–99)
Potassium: 4.3 mmol/L (ref 3.5–5.1)
Sodium: 132 mmol/L — ABNORMAL LOW (ref 135–145)
Total Bilirubin: 1 mg/dL (ref 0.3–1.2)
Total Protein: 6.9 g/dL (ref 6.5–8.1)

## 2017-09-01 LAB — CBC
HCT: 35.2 % — ABNORMAL LOW (ref 39.0–52.0)
HEMOGLOBIN: 11.2 g/dL — AB (ref 13.0–17.0)
MCH: 28.4 pg (ref 26.0–34.0)
MCHC: 31.8 g/dL (ref 30.0–36.0)
MCV: 89.3 fL (ref 78.0–100.0)
PLATELETS: 240 10*3/uL (ref 150–400)
RBC: 3.94 MIL/uL — AB (ref 4.22–5.81)
RDW: 13.2 % (ref 11.5–15.5)
WBC: 11.7 10*3/uL — AB (ref 4.0–10.5)

## 2017-09-01 MED ORDER — FUROSEMIDE 10 MG/ML IJ SOLN
80.0000 mg | Freq: Once | INTRAMUSCULAR | Status: AC
  Administered 2017-09-01: 80 mg via INTRAVENOUS
  Filled 2017-09-01: qty 8

## 2017-09-01 NOTE — Progress Notes (Signed)
   09/01/17 0056  Vitals  Temp (!) 97.5 F (36.4 C)  ECG Heart Rate (!) 102  Resp (!) 24  Oxygen Therapy  SpO2 99 %  Art Line (2)  Arterial Line BP 2 96/62  Arterial Line MAP (mmHg) 73 mmHg  Invasive Hemodynamic Monitoring  CVP (mmHg) 6 mmHg  PAP (Mean) 29 mmHg  PAP 48/19  CO (L/min) 4.4 L/min  CI (L/min/m2) 2.1 L/min/m2  PCWP (mmHg) 23 mmHg  SVR (dyne*sec)/cm5 1207 (dyne*sec)/cm5  SVRI (dyne*sec)/cm5 2534 (dyne*sec)/cm5  PVR (dyne*sec)/cm5 108 (dyne*sec)/cm5  PVRI (dyne*sec)/cm5 227 (dyne*sec)/cm5  SV (mL) 43.5 mL  LVSW 29.6  LVSWI 14.1  RVSW 13.62  RVSWI 6.48

## 2017-09-01 NOTE — Progress Notes (Signed)
CSW met at bedside with patient and multiple family members. Patient asked for assistance and emotional support in discussion with his children around LVAD surgery planned for Tuesday. CSW and NP discussed with children and provided information and VAD equipment shown with questions answered.  CSW later met with patient to complete the PHQ-9 depression screen. Patient scored a 17 on the PHQ-9. CSW discussed coping skills, supportive counseling around issues of adjustment to illness and preparation for surgery. Patient appears to be anxious and concerned for his children. CSW will have VAD Coordinator meet with patient tomorrow to review equipment and surgery plan. Patient grateful for support and stated he feels he is "ready to get this done". CSW will continue to follow for support throughout implant hospitalization. [name], [place], [place]

## 2017-09-01 NOTE — Progress Notes (Signed)
   09/01/17 0413  Vitals  Temp (!) 97.5 F (36.4 C)  ECG Heart Rate 99  Resp (!) 29  Oxygen Therapy  SpO2 96 %  Art Line (2)  Arterial Line BP 2 119/80  Arterial Line MAP (mmHg) 93 mmHg  Invasive Hemodynamic Monitoring  CVP (mmHg) 9 mmHg  PAP (Mean) 33 mmHg  PAP 50/24  CO (L/min) 5.1 L/min  CI (L/min/m2) 2.4 L/min/m2  PCWP (mmHg) 25 mmHg  SVR (dyne*sec)/cm5 1330 (dyne*sec)/cm5  SVRI (dyne*sec)/cm5 2793 (dyne*sec)/cm5  PVR (dyne*sec)/cm5 127 (dyne*sec)/cm5  PVRI (dyne*sec)/cm5 266 (dyne*sec)/cm5  SV (mL) 51 mL  LVSW 47.2  LVSWI 22.5  RVSW 16.65  RVSWI 7.93

## 2017-09-01 NOTE — Progress Notes (Signed)
Palliative:  I was asked to come back and speak with Mr. Stranger and his family regarding Living Will. I discussed and walked him through the document and he made his decisions known on form and to his family.   Also discussed some basics regarding LVAD and how this work, care needs and responsibilities to care givers, and what to expect immediately post-op. They need to meet LVAD patient and be shown LVAD equipment which is planned for tomorrow. Emotional support provided.   [name], NP Palliative Medicine Team Pager # [phone] (M-F 8a-5p) Team Phone # [phone] (Nights/Weekends)

## 2017-09-01 NOTE — Progress Notes (Addendum)
Advanced Heart Failure Rounding Note  PCP-Cardiologist: No primary care provider on file.   Subjective:    Yesterday norepi added.   Currently on milrinone 0.25 mcg + norepi 2 mcg. CO-OX down to 53%.   Denies SOB. Feels weak. Anxious about LVAD<   CVP 8 CO-OX 53% PAP 50/26 PCWP 25 CO 5.1 CI 2.4   RHC 4/16 On milrinone 0.125 mcg/kg/min RA = 6 RV = 43/10 PA = 42/21 (30) PCW = 21 Fick cardiac output/index = 3.8/1.8 Thermo CO/CI = 5.4/2.6 PVR = 1.8 Papi = 3.5 RA/PCWP = 0.29 Ao sat = 92% PA sat = 47%, 49%  Objective:   Weight Range: 188 lb 11.4 oz (85.6 kg) Body mass index is 24.23 kg/m.   Vital Signs:   Temp:  [97.3 F (36.3 C)-98.8 F (37.1 C)] 97.7 F (36.5 C) (04/18 0700) Resp:  [0-36] 26 (04/18 0700) BP: (92-125)/(58-96) 103/65 (04/18 0700) SpO2:  [93 %-100 %] 94 % (04/18 0700) Weight:  [188 lb 11.4 oz (85.6 kg)] 188 lb 11.4 oz (85.6 kg) (04/18 0500) Last BM Date: 08/31/17  Weight change: Filed Weights   08/30/17 1800 08/31/17 0500 09/01/17 0500  Weight: 184 lb 8.4 oz (83.7 kg) 186 lb 11.7 oz (84.7 kg) 188 lb 11.4 oz (85.6 kg)    Intake/Output:   Intake/Output Summary (Last 24 hours) at 09/01/2017 0731 Last data filed at 09/01/2017 0700 Gross per 24 hour  Intake 2222.58 ml  Output 1160 ml  Net 1062.58 ml      Physical Exam   CVP 15-16  General:   No resp difficulty HEENT: normal Neck: supple. JVP to jaw . Carotids 2+ bilat; no bruits. No lymphadenopathy or thryomegaly appreciated. RIJ swan  Cor: PMI nondisplaced. Regular rate & rhythm. No rubs,  or murmurs. + S3  Lungs: clear Abdomen: soft, nontender, nondistended. No hepatosplenomegaly. No bruits or masses. Good bowel sounds. Extremities: no cyanosis, clubbing, rash, edema. RUE PICC Neuro: alert & orientedx3, cranial nerves grossly intact. moves all 4 extremities w/o difficulty. Affect pleasant   Telemetry  ST 100s   EKG    n/a  Labs    CBC Recent Labs    08/31/17 0309  09/01/17 0414  WBC 15.1* 11.7*  HGB 11.9* 11.2*  HCT 37.3* 35.2*  MCV 89.2 89.3  PLT 245 240   Basic Metabolic Panel Recent Labs    16/10/96 1237 08/31/17 0309 08/31/17 1256 09/01/17 0414  NA  --  134*  --  132*  K  --  4.9  --  4.3  CL  --  101  --  103  CO2  --  25  --  24  GLUCOSE  --  136*  --  118*  BUN  --  24*  --  25*  CREATININE  --  1.12  --  0.96  CALCIUM  --  8.9  --  8.6*  MG 2.1  --  2.3  --    Liver Function Tests Recent Labs    09/01/17 0414  AST 27  ALT 76*  ALKPHOS 88  BILITOT 1.0  PROT 6.9  ALBUMIN 2.8*   No results for input(s): LIPASE, AMYLASE in the last 72 hours. Cardiac Enzymes No results for input(s): CKTOTAL, CKMB, CKMBINDEX, TROPONINI in the last 72 hours.  BNP: BNP (last 3 results) Recent Labs    12/07/16 1838 01/28/17 1412 08/25/17 1337  BNP 1,321.0* 2,506.0* 2,510.0*    ProBNP (last 3 results) No results for input(s): PROBNP  in the last 8760 hours.   D-Dimer No results for input(s): DDIMER in the last 72 hours. Hemoglobin A1C Recent Labs    08/30/17 1237  HGBA1C 5.2   Fasting Lipid Panel Recent Labs    08/30/17 1237  CHOL 111  HDL 33*  LDLCALC 70  TRIG 38  CHOLHDL 3.4   Thyroid Function Tests Recent Labs    08/30/17 1237  TSH 1.957    Other results:   Imaging    Dg Orthopantogram  Result Date: 08/31/2017 CLINICAL DATA:  Preoperative evaluation of poor dentition. EXAM: ORTHOPANTOGRAM/PANORAMIC COMPARISON:  None. FINDINGS: Many missing teeth. Remaining teeth show a few small caries but no evidence of advanced disease and no evidence of root abscess. IMPRESSION: Ordinary scattered caries.  No advanced decay or root abscess. Electronically Signed   By: Paulina Fusi M.D.   On: 08/31/2017 16:43     Medications:     Scheduled Medications: . aspirin EC  81 mg Oral Daily  . benzonatate  100 mg Oral TID  . Chlorhexidine Gluconate Cloth  6 each Topical Daily  . digoxin  0.25 mg Oral Daily  .  doxycycline  100 mg Oral Q12H  . enoxaparin (LOVENOX) injection  40 mg Subcutaneous Q24H  . furosemide  40 mg Oral Daily  . guaiFENesin  600 mg Oral BID  . Living Better with Heart Failure Book   Does not apply Once  . methocarbamol  500 mg Oral TID  . polyethylene glycol  17 g Oral Daily  . predniSONE  20 mg Oral Q breakfast  . sertraline  25 mg Oral Daily  . sodium chloride flush  10-40 mL Intracatheter Q12H  . sodium chloride flush  3 mL Intravenous Q12H  . traZODone  50 mg Oral QHS    Infusions: . sodium chloride 10 mL/hr at 09/01/17 0700  . milrinone 0.25 mcg/kg/min (09/01/17 0700)  . norepinephrine (LEVOPHED) Adult infusion 2 mcg/min (09/01/17 0700)    PRN Medications: sodium chloride, acetaminophen, albuterol, ALPRAZolam, alum & mag hydroxide-simeth, guaiFENesin-dextromethorphan, nitroGLYCERIN, ondansetron (ZOFRAN) IV, simethicone, sodium chloride flush, sodium chloride flush, traMADol    Patient Profile   Ms Olund is 47 year old with a history of NICM, HTN, ETOH abuse, smoker, asthma, and chronic systolic heart failure.   Assessment/Plan  1. Cardiogenic Shock- NICM had LHC 2018 with normal cors.  ECHO this admit EF 5-10%. RV moderately reduced.  - Diagnosed with HF about 1 year ago. Functional decline over the last few months.  -Volume status stable. Add 40 mg po lasix.  - CO-OX 53%.  Continue milrinone 0.25 mcg.  Increase norepi .  Repeat CO-OX at 1000.   - . Hold off Arb/bb shock .  - Continue digoxin.  - Smoker- Not a candidate for transplant  -  VAD coordinators, HFSW, Palliative Care consulted and following along.  - Working on Therapist, occupational.   - Blood Type B+  - needs palliative care consult for GOC.   2. Shock Liver Elevated LFTs on admit.  Continues to trend down.    3. CKD Stage II Renal function stable.   4.  Tobacco Abuse  Counseled to stop smoking   5. ETOH  Counseled to stop drinking all together.   6. Gout Uric Acid 14.5  on steroids.   7. CAP On steroids + doxycyline.   8. ID:  WBC down to 11.  On steroids.   9. Anxiety Started xanax and sertraline on 4/17 .    Medication  concerns reviewed with patient and pharmacy team. Barriers identified: yes no insurance.   Length of Stay: 7  Amy Clegg, NP  09/01/2017, 7:31 AM  Advanced Heart Failure Team Pager [phone] (M-F; 7a - 4p)  Please contact [facility] for night-coverage after hours (4p -7a ) and weekends on [email or web address]  Agree with above.  He is critically ill. Now on milrinone and norepi. Co-ox remains marginal. Swan numbers moderately improved. PaPI favorable for VAD at this point. Volume status climbing agree with restarting lasix.   Exam In bed RIJ swan JVP ~9-10 Cor RRR +s3 Lungs CTA Ab soft NT good BS Ext cool no edema   Case discussed with Dr. Donata Clay and VAD team. Will plan VAD next Tuesday. D/w VAD SW and it does not look like we will have full insurance approval by next week but we will not be able to wait if we are going to be able to save him. Financial team involved. Will plan IABP placement to stabilize over the weekend and optimize for VAD. D/w his family.   CRITICAL CARE Performed by: Arvilla Meres  Total critical care time: 35 minutes  Critical care time was exclusive of separately billable procedures and treating other patients.  Critical care was necessary to treat or prevent imminent or life-threatening deterioration.  Critical care was time spent personally by me (independent of midlevel providers or residents) on the following activities: development of treatment plan with patient and/or surrogate as well as nursing, discussions with consultants, evaluation of patient's response to treatment, examination of patient, obtaining history from patient or surrogate, ordering and performing treatments and interventions, ordering and review of laboratory studies, ordering and review of radiographic studies, pulse  oximetry and re-evaluation of patient's condition.    Arvilla Meres, MD  8:38 PM

## 2017-09-01 NOTE — Progress Notes (Signed)
Responded to [facility] to assist patient with AD upon my arrival patient had not had time to review document and wanted to wait until his mother arrived so that he could converse with her.  Nurse was present and will page chaplain when patient has completed document and ready.  Venida Jarvis, [place], [facility], Pager [phone]

## 2017-09-01 NOTE — Progress Notes (Signed)
Palliative:  I met today with Mr. Hankerson, no visitors at bedside. His affect is a little flat and would likely benefit from antidepressant. He shares that he was able to sleep some last night and is ready to proceed with LVAD placement. We discussed the importance of HCPOA documentation given he is not legally divorced yet and he is very motivated to get this notarized. I left a pen for him to complete paperwork and will ask chaplains to follow up to complete and notarize. He has no questions or further concerns today. Anxious and scared about procedure understandably. I believe it will help for him to have visit from LVAD patient. Emotional support provided.   20 min  [name], NP Palliative Medicine Team Pager # [phone] (M-F 8a-5p) Team Phone # [phone] (Nights/Weekends)

## 2017-09-01 NOTE — Progress Notes (Signed)
Ref: Patient, No Pcp Per   Subjective:  Feeling better. Awaiting IABP tomorrow and LVAD next week. Afebrile. On IV milrinone 0.25 mcg and norepinephrine 2 mcg. CO-OX 53 %. Wedge pressure 21 mm Hg.  Objective:  Vital Signs in the last 24 hours: Temp:  [97.3 F (36.3 C)-98.8 F (37.1 C)] 97.7 F (36.5 C) (04/18 0700) Cardiac Rhythm: Normal sinus rhythm;Sinus tachycardia (04/18 0400) Resp:  [0-36] 26 (04/18 0700) BP: (92-125)/(58-96) 103/65 (04/18 0700) SpO2:  [93 %-100 %] 94 % (04/18 0700) Weight:  [85.6 kg (188 lb 11.4 oz)] 85.6 kg (188 lb 11.4 oz) (04/18 0500)  Physical Exam: BP Readings from Last 1 Encounters:  09/01/17 103/65     Wt Readings from Last 1 Encounters:  09/01/17 85.6 kg (188 lb 11.4 oz)    Weight change: 1.9 kg (4 lb 3 oz) Body mass index is 24.23 kg/m. HEENT: [place]/AT, Eyes-Brown, PERL, EOMI, Conjunctiva-Pink, Sclera-Non-icteric Neck: No JVD, No bruit, Trachea midline. Lungs:  Clearing, Bilateral. Cardiac:  Regular rhythm, normal S1 and S2, no S3. II/VI systolic murmur. Abdomen:  Soft, non-tender. BS present. Extremities:  Trace edema present. No cyanosis. No clubbing. CNS: AxOx3, Cranial nerves grossly intact, moves all 4 extremities.  Skin: Warm and dry.   Intake/Output from previous day: 04/17 0701 - 04/18 0700 In: 2222.6 [P.O.:1670; I.V.:552.6] Out: 1160 [Urine:1160]    Lab Results: BMET    Component Value Date/Time   NA 132 (L) 09/01/2017 0414   NA 134 (L) 08/31/2017 0309   NA 130 (L) 08/30/2017 0518   K 4.3 09/01/2017 0414   K 4.9 08/31/2017 0309   K 5.0 08/30/2017 0518   CL 103 09/01/2017 0414   CL 101 08/31/2017 0309   CL 98 (L) 08/30/2017 0518   CO2 24 09/01/2017 0414   CO2 25 08/31/2017 0309   CO2 25 08/30/2017 0518   GLUCOSE 118 (H) 09/01/2017 0414   GLUCOSE 136 (H) 08/31/2017 0309   GLUCOSE 128 (H) 08/30/2017 0518   BUN 25 (H) 09/01/2017 0414   BUN 24 (H) 08/31/2017 0309   BUN 31 (H) 08/30/2017 0518   CREATININE 0.96 09/01/2017  0414   CREATININE 1.12 08/31/2017 0309   CREATININE 1.14 08/30/2017 0518   CALCIUM 8.6 (L) 09/01/2017 0414   CALCIUM 8.9 08/31/2017 0309   CALCIUM 8.8 (L) 08/30/2017 0518   GFRNONAA >60 09/01/2017 0414   GFRNONAA >60 08/31/2017 0309   GFRNONAA >60 08/30/2017 0518   GFRAA >60 09/01/2017 0414   GFRAA >60 08/31/2017 0309   GFRAA >60 08/30/2017 0518   CBC    Component Value Date/Time   WBC 11.7 (H) 09/01/2017 0414   RBC 3.94 (L) 09/01/2017 0414   HGB 11.2 (L) 09/01/2017 0414   HCT 35.2 (L) 09/01/2017 0414   PLT 240 09/01/2017 0414   MCV 89.3 09/01/2017 0414   MCH 28.4 09/01/2017 0414   MCHC 31.8 09/01/2017 0414   RDW 13.2 09/01/2017 0414   LYMPHSABS 5.6 (H) 10/08/2016 0332   MONOABS 0.9 10/08/2016 0332   EOSABS 0.3 10/08/2016 0332   BASOSABS 0.0 10/08/2016 0332   HEPATIC Function Panel Recent Labs    08/26/17 0347 08/28/17 0431 09/01/17 0414  PROT 7.0 6.7 6.9   HEMOGLOBIN A1C No components found for: HGA1C,  MPG CARDIAC ENZYMES Lab Results  Component Value Date   TROPONINI 0.04 (HH) 08/28/2017   TROPONINI 0.05 (HH) 08/26/2017   TROPONINI 0.05 (HH) 08/26/2017   BNP No results for input(s): PROBNP in the last 8760 hours. TSH  Recent Labs    08/30/17 1237  TSH 1.957   CHOLESTEROL Recent Labs    08/30/17 1237  CHOL 111    Scheduled Meds: . aspirin EC  81 mg Oral Daily  . benzonatate  100 mg Oral TID  . Chlorhexidine Gluconate Cloth  6 each Topical Daily  . digoxin  0.25 mg Oral Daily  . doxycycline  100 mg Oral Q12H  . enoxaparin (LOVENOX) injection  40 mg Subcutaneous Q24H  . guaiFENesin  600 mg Oral BID  . Living Better with Heart Failure Book   Does not apply Once  . methocarbamol  500 mg Oral TID  . polyethylene glycol  17 g Oral Daily  . predniSONE  20 mg Oral Q breakfast  . sertraline  25 mg Oral Daily  . sodium chloride flush  10-40 mL Intracatheter Q12H  . sodium chloride flush  3 mL Intravenous Q12H  . traZODone  50 mg Oral QHS    Continuous Infusions: . sodium chloride 10 mL/hr at 09/01/17 0700  . milrinone 0.25 mcg/kg/min (09/01/17 0700)  . norepinephrine (LEVOPHED) Adult infusion 2 mcg/min (09/01/17 0700)   PRN Meds:.sodium chloride, acetaminophen, albuterol, ALPRAZolam, alum & mag hydroxide-simeth, guaiFENesin-dextromethorphan, nitroGLYCERIN, ondansetron (ZOFRAN) IV, simethicone, sodium chloride flush, sodium chloride flush, traMADol  Assessment/Plan: Acute on chronic systolic left heart failure Possible pneumonia PE ruled out Dilated cardiomyopathy  Awaiting LVAD and IABP.   LOS: 7 days    Orpah Cobb  MD  09/01/2017, 9:42 AM

## 2017-09-01 NOTE — Plan of Care (Signed)
Patient making decisions about POA, is anxious about the procedure, emotional support given, xanax helping. Coox 63 with increase in levophed.  DOE going to bathroom, plan for IABP  09/02/2017. Patient aware of what this entails, especially  lying flat and not being able to get up and sit up all the way. Continue to discuss healthy eating habits. Patient noncompliant with diet yesterday, compliant so far today.

## 2017-09-02 ENCOUNTER — Encounter (HOSPITAL_COMMUNITY)
Admission: EM | Disposition: A | Discharge status: Home / Self Care | Payer: Self-pay | Source: Home / Self Care | Attending: Internal Medicine

## 2017-09-02 LAB — COMPREHENSIVE METABOLIC PANEL
ALT: 55 U/L (ref 17–63)
AST: 25 U/L (ref 15–41)
Albumin: 2.7 g/dL — ABNORMAL LOW (ref 3.5–5.0)
Alkaline Phosphatase: 82 U/L (ref 38–126)
Anion gap: 7 (ref 5–15)
BUN: 23 mg/dL — ABNORMAL HIGH (ref 6–20)
CO2: 22 mmol/L (ref 22–32)
Calcium: 8.3 mg/dL — ABNORMAL LOW (ref 8.9–10.3)
Chloride: 104 mmol/L (ref 101–111)
Creatinine, Ser: 1.06 mg/dL (ref 0.61–1.24)
GFR calc Af Amer: >60 mL/min (ref >60)
GFR calc non Af Amer: >60 mL/min (ref >60)
Glucose, Bld: 250 mg/dL — ABNORMAL HIGH (ref 65–99)
Potassium: 3.9 mmol/L (ref 3.5–5.1)
Sodium: 133 mmol/L — ABNORMAL LOW (ref 135–145)
Total Bilirubin: 0.9 mg/dL (ref 0.3–1.2)
Total Protein: 6.4 g/dL — ABNORMAL LOW (ref 6.5–8.1)

## 2017-09-02 LAB — CBC
HCT: 35.4 % — ABNORMAL LOW (ref 39.0–52.0)
Hemoglobin: 11.2 g/dL — ABNORMAL LOW (ref 13.0–17.0)
MCH: 28.4 pg (ref 26.0–34.0)
MCHC: 31.6 g/dL (ref 30.0–36.0)
MCV: 89.6 fL (ref 78.0–100.0)
Platelets: 217 10*3/uL (ref 150–400)
RBC: 3.95 MIL/uL — AB (ref 4.22–5.81)
RDW: 13.2 % (ref 11.5–15.5)
WBC: 8.2 10*3/uL (ref 4.0–10.5)

## 2017-09-02 LAB — COOXEMETRY PANEL
CARBOXYHEMOGLOBIN: 1.6 % — AB (ref 0.5–1.5)
Carboxyhemoglobin: 1.5 % (ref 0.5–1.5)
Methemoglobin: 1.5 % (ref 0.0–1.5)
Methemoglobin: 1.4 % (ref 0.0–1.5)
O2 SAT: 62.9 %
O2 Saturation: 60.8 %
TOTAL HEMOGLOBIN: 11.6 g/dL — AB (ref 12.0–16.0)
Total hemoglobin: 10.7 g/dL — ABNORMAL LOW (ref 12.0–16.0)

## 2017-09-02 SURGERY — IABP INSERTION
Anesthesia: LOCAL

## 2017-09-02 MED ORDER — SODIUM CHLORIDE 0.9 % IV SOLN: 250.0000 mL | INTRAVENOUS | Status: DC | PRN

## 2017-09-02 MED ORDER — SODIUM CHLORIDE 0.9% FLUSH: 3.0000 mL | INTRAVENOUS | Status: DC | PRN

## 2017-09-02 MED ORDER — ASPIRIN 81 MG PO CHEW
81.0000 mg | CHEWABLE_TABLET | ORAL | Status: AC
  Administered 2017-09-02: 81 mg via ORAL
  Filled 2017-09-02: qty 1

## 2017-09-02 MED ORDER — POTASSIUM CHLORIDE CRYS ER 10 MEQ PO TBCR
10.0000 meq | EXTENDED_RELEASE_TABLET | Freq: Two times a day (BID) | ORAL | Status: AC
  Administered 2017-09-02 – 2017-09-04 (×6): 10 meq via ORAL
  Filled 2017-09-02 (×9): qty 1

## 2017-09-02 MED ORDER — ENOXAPARIN SODIUM 40 MG/0.4ML ~~LOC~~ SOLN
40.0000 mg | SUBCUTANEOUS | Status: DC
  Administered 2017-09-02 – 2017-09-04 (×3): 40 mg via SUBCUTANEOUS
  Filled 2017-09-02 (×3): qty 0.4

## 2017-09-02 MED ORDER — SODIUM CHLORIDE 0.9% FLUSH
3.0000 mL | Freq: Two times a day (BID) | INTRAVENOUS | Status: DC
  Administered 2017-09-02: 3 mL via INTRAVENOUS

## 2017-09-02 MED ORDER — FUROSEMIDE 10 MG/ML IJ SOLN
40.0000 mg | Freq: Once | INTRAMUSCULAR | Status: AC
  Administered 2017-09-02: 40 mg via INTRAVENOUS
  Filled 2017-09-02: qty 4

## 2017-09-02 MED ORDER — SODIUM CHLORIDE 0.9 % IV SOLN: INTRAVENOUS | Status: DC

## 2017-09-02 MED ORDER — DIGOXIN 125 MCG PO TABS
0.1250 mg | ORAL_TABLET | Freq: Every day | ORAL | Status: DC
  Administered 2017-09-02 – 2017-09-05 (×4): 0.125 mg via ORAL
  Filled 2017-09-02 (×4): qty 1

## 2017-09-02 MED ORDER — METHOCARBAMOL 500 MG PO TABS
500.0000 mg | ORAL_TABLET | Freq: Three times a day (TID) | ORAL | Status: DC | PRN
  Administered 2017-09-05: 500 mg via ORAL
  Filled 2017-09-02: qty 1

## 2017-09-02 MED ORDER — FUROSEMIDE 40 MG PO TABS: 40.0000 mg | ORAL_TABLET | Freq: Two times a day (BID) | ORAL | Status: DC

## 2017-09-02 MED ORDER — SPIRONOLACTONE 12.5 MG HALF TABLET
12.5000 mg | ORAL_TABLET | Freq: Every day | ORAL | Status: DC
  Administered 2017-09-02 – 2017-09-04 (×3): 12.5 mg via ORAL
  Filled 2017-09-02 (×5): qty 1

## 2017-09-02 NOTE — Progress Notes (Signed)
Ref: Patient, No Pcp Per   Subjective:  Mild respiratory distress. Improving and stable blood pressure control. CO-OX 61 %. Milrinone 0.25 and norepinephrine 3 mcg. Wedge pressure 25 mm Hg.   Objective:  Vital Signs in the last 24 hours: Temp:  [97.5 F (36.4 C)-98.2 F (36.8 C)] 98 F (36.7 C) (04/19 0744) Cardiac Rhythm: Sinus tachycardia (04/19 0000) Resp:  [4-37] 23 (04/19 0700) BP: (95-128)/(58-94) 120/89 (04/19 0700) SpO2:  [92 %-99 %] 99 % (04/19 0700) Weight:  [85.4 kg (188 lb 3.2 oz)] 85.4 kg (188 lb 3.2 oz) (04/19 0500)  Physical Exam: BP Readings from Last 1 Encounters:  09/02/17 120/89     Wt Readings from Last 1 Encounters:  09/02/17 85.4 kg (188 lb 3.2 oz)    Weight change: -0.233 kg (-8.2 oz) Body mass index is 24.16 kg/m. HEENT: [place]/AT, Eyes-Brown, PERL, EOMI, Conjunctiva-Pink, Sclera-Non-icteric Neck: No JVD, No bruit, Trachea midline. Lungs:  Clearing, Bilateral. Cardiac:  Regular rhythm, normal S1 and S2, no S3. II/VI systolic murmur. Abdomen:  Soft, non-tender. BS present. Extremities:  Trace edema present. No cyanosis. No clubbing. CNS: AxOx3, Cranial nerves grossly intact, moves all 4 extremities.  Skin: Warm and dry.   Intake/Output from previous day: 04/18 0701 - 04/19 0700 In: 1109.1 [P.O.:480; I.V.:629.1] Out: 3080 [Urine:3080]    Lab Results: BMET    Component Value Date/Time   NA 133 (L) 09/02/2017 0452   NA 132 (L) 09/01/2017 0414   NA 134 (L) 08/31/2017 0309   K 3.9 09/02/2017 0452   K 4.3 09/01/2017 0414   K 4.9 08/31/2017 0309   CL 104 09/02/2017 0452   CL 103 09/01/2017 0414   CL 101 08/31/2017 0309   CO2 22 09/02/2017 0452   CO2 24 09/01/2017 0414   CO2 25 08/31/2017 0309   GLUCOSE 250 (H) 09/02/2017 0452   GLUCOSE 118 (H) 09/01/2017 0414   GLUCOSE 136 (H) 08/31/2017 0309   BUN 23 (H) 09/02/2017 0452   BUN 25 (H) 09/01/2017 0414   BUN 24 (H) 08/31/2017 0309   CREATININE 1.06 09/02/2017 0452   CREATININE 0.96 09/01/2017  0414   CREATININE 1.12 08/31/2017 0309   CALCIUM 8.3 (L) 09/02/2017 0452   CALCIUM 8.6 (L) 09/01/2017 0414   CALCIUM 8.9 08/31/2017 0309   GFRNONAA >60 09/02/2017 0452   GFRNONAA >60 09/01/2017 0414   GFRNONAA >60 08/31/2017 0309   GFRAA >60 09/02/2017 0452   GFRAA >60 09/01/2017 0414   GFRAA >60 08/31/2017 0309   CBC    Component Value Date/Time   WBC 11.7 (H) 09/01/2017 0414   RBC 3.94 (L) 09/01/2017 0414   HGB 11.2 (L) 09/01/2017 0414   HCT 35.2 (L) 09/01/2017 0414   PLT 240 09/01/2017 0414   MCV 89.3 09/01/2017 0414   MCH 28.4 09/01/2017 0414   MCHC 31.8 09/01/2017 0414   RDW 13.2 09/01/2017 0414   LYMPHSABS 5.6 (H) 10/08/2016 0332   MONOABS 0.9 10/08/2016 0332   EOSABS 0.3 10/08/2016 0332   BASOSABS 0.0 10/08/2016 0332   HEPATIC Function Panel Recent Labs    08/28/17 0431 09/01/17 0414 09/02/17 0452  PROT 6.7 6.9 6.4*   HEMOGLOBIN A1C No components found for: HGA1C,  MPG CARDIAC ENZYMES Lab Results  Component Value Date   TROPONINI 0.04 (HH) 08/28/2017   TROPONINI 0.05 (HH) 08/26/2017   TROPONINI 0.05 (HH) 08/26/2017   BNP No results for input(s): PROBNP in the last 8760 hours. TSH Recent Labs    08/30/17 1237  TSH 1.957   CHOLESTEROL Recent Labs    08/30/17 1237  CHOL 111    Scheduled Meds: . aspirin EC  81 mg Oral Daily  . benzonatate  100 mg Oral TID  . Chlorhexidine Gluconate Cloth  6 each Topical Daily  . digoxin  0.125 mg Oral Daily  . doxycycline  100 mg Oral Q12H  . guaiFENesin  600 mg Oral BID  . Living Better with Heart Failure Book   Does not apply Once  . polyethylene glycol  17 g Oral Daily  . potassium chloride  10 mEq Oral BID  . predniSONE  20 mg Oral Q breakfast  . sertraline  25 mg Oral Daily  . sodium chloride flush  10-40 mL Intracatheter Q12H  . sodium chloride flush  3 mL Intravenous Q12H  . sodium chloride flush  3 mL Intravenous Q12H  . traZODone  50 mg Oral QHS   Continuous Infusions: . sodium chloride 10  mL/hr at 09/02/17 0600  . sodium chloride    . sodium chloride    . milrinone 0.25 mcg/kg/min (09/02/17 0600)  . norepinephrine (LEVOPHED) Adult infusion 3.013 mcg/min (09/02/17 0600)   PRN Meds:.sodium chloride, sodium chloride, acetaminophen, albuterol, ALPRAZolam, alum & mag hydroxide-simeth, guaiFENesin-dextromethorphan, methocarbamol, nitroGLYCERIN, ondansetron (ZOFRAN) IV, simethicone, sodium chloride flush, sodium chloride flush, sodium chloride flush, traMADol  Assessment/Plan: Acute on chronic systolic left heart failure Possible pneumonia PE ruled out Dilated cardiomyopathy Moderate pulmonary systolic hypertension  Awaiting LVAD and IABP.   LOS: 8 days    Orpah Cobb  MD  09/02/2017, 10:47 AM

## 2017-09-02 NOTE — Progress Notes (Addendum)
Advanced Heart Failure Rounding Note  PCP-Cardiologist: No primary care provider on file.   Subjective:    Yesterday norepi increased to 3 mcg and diuresed with IV lasix. Negative 1.9 liters .   Remains on norepi 3 mcg + milrinone 0.25 mcg. CO-OX 61%.    Denies SOB. Fatigued. No orthopnea or PND. Anxious about upcoming procedures.   CVP 9-10  CO-OX 61%.  PAP 57/31 PCWP 25 CO 5.4  CI 2.6    RHC 4/16 On milrinone 0.125 mcg/kg/min RA = 6 RV = 43/10 PA = 42/21 (30) PCW = 21 Fick cardiac output/index = 3.8/1.8 Thermo CO/CI = 5.4/2.6 PVR = 1.8 Papi = 3.5 RA/PCWP = 0.29 Ao sat = 92% PA sat = 47%, 49%  Objective:   Weight Range: 188 lb 3.2 oz (85.4 kg) Body mass index is 24.16 kg/m.   Vital Signs:   Temp:  [97.5 F (36.4 C)-98.2 F (36.8 C)] 98 F (36.7 C) (04/19 0744) Resp:  [4-37] 23 (04/19 0700) BP: (95-128)/(58-94) 120/89 (04/19 0700) SpO2:  [92 %-99 %] 99 % (04/19 0700) Weight:  [188 lb 3.2 oz (85.4 kg)] 188 lb 3.2 oz (85.4 kg) (04/19 0500) Last BM Date: 09/01/17  Weight change: Filed Weights   08/31/17 0500 09/01/17 0500 09/02/17 0500  Weight: 186 lb 11.7 oz (84.7 kg) 188 lb 11.4 oz (85.6 kg) 188 lb 3.2 oz (85.4 kg)    Intake/Output:   Intake/Output Summary (Last 24 hours) at 09/02/2017 0801 Last data filed at 09/02/2017 0600 Gross per 24 hour  Intake 1085.3 ml  Output 3080 ml  Net -1994.7 ml      Physical Exam   CVP 9-10  General:  Well appearing. No resp difficulty HEENT: normal Neck: supple. JVP 9-10 . Carotids 2+ bilat; no bruits. No lymphadenopathy or thryomegaly appreciated. RIJ swan  Cor: PMI laterally displaced displaced. Regular rate & rhythm. No rubs,  or murmurs. +S3  Lungs: clear Abdomen: soft, nontender, nondistended. No hepatosplenomegaly. No bruits or masses. Good bowel sounds. Extremities: no cyanosis, clubbing, rash, edema Neuro: alert & orientedx3, cranial nerves grossly intact. moves all 4 extremities w/o difficulty.  Affect pleasant  Telemetry  NSR-Sinus Tach  with PVCs personally reviewed.   EKG    n/a  Labs    CBC Recent Labs    08/31/17 0309 09/01/17 0414  WBC 15.1* 11.7*  HGB 11.9* 11.2*  HCT 37.3* 35.2*  MCV 89.2 89.3  PLT 245 240   Basic Metabolic Panel Recent Labs    78/46/96 1237  08/31/17 1256 09/01/17 0414 09/02/17 0452  NA  --    < >  --  132* 133*  K  --    < >  --  4.3 3.9  CL  --    < >  --  103 104  CO2  --    < >  --  24 22  GLUCOSE  --    < >  --  118* 250*  BUN  --    < >  --  25* 23*  CREATININE  --    < >  --  0.96 1.06  CALCIUM  --    < >  --  8.6* 8.3*  MG 2.1  --  2.3  --   --    < > = values in this interval not displayed.   Liver Function Tests Recent Labs    09/01/17 0414 09/02/17 0452  AST 27 25  ALT 76* 55  ALKPHOS 88 82  BILITOT 1.0 0.9  PROT 6.9 6.4*  ALBUMIN 2.8* 2.7*   No results for input(s): LIPASE, AMYLASE in the last 72 hours. Cardiac Enzymes No results for input(s): CKTOTAL, CKMB, CKMBINDEX, TROPONINI in the last 72 hours.  BNP: BNP (last 3 results) Recent Labs    12/07/16 1838 01/28/17 1412 08/25/17 1337  BNP 1,321.0* 2,506.0* 2,510.0*    ProBNP (last 3 results) No results for input(s): PROBNP in the last 8760 hours.   D-Dimer No results for input(s): DDIMER in the last 72 hours. Hemoglobin A1C Recent Labs    08/30/17 1237  HGBA1C 5.2   Fasting Lipid Panel Recent Labs    08/30/17 1237  CHOL 111  HDL 33*  LDLCALC 70  TRIG 38  CHOLHDL 3.4   Thyroid Function Tests Recent Labs    08/30/17 1237  TSH 1.957    Other results:   Imaging    No results found.   Medications:     Scheduled Medications: . aspirin EC  81 mg Oral Daily  . benzonatate  100 mg Oral TID  . Chlorhexidine Gluconate Cloth  6 each Topical Daily  . digoxin  0.25 mg Oral Daily  . doxycycline  100 mg Oral Q12H  . enoxaparin (LOVENOX) injection  40 mg Subcutaneous Q24H  . guaiFENesin  600 mg Oral BID  . Living Better with  Heart Failure Book   Does not apply Once  . methocarbamol  500 mg Oral TID  . polyethylene glycol  17 g Oral Daily  . predniSONE  20 mg Oral Q breakfast  . sertraline  25 mg Oral Daily  . sodium chloride flush  10-40 mL Intracatheter Q12H  . sodium chloride flush  3 mL Intravenous Q12H  . sodium chloride flush  3 mL Intravenous Q12H  . traZODone  50 mg Oral QHS    Infusions: . sodium chloride 10 mL/hr at 09/02/17 0600  . sodium chloride    . sodium chloride    . milrinone 0.25 mcg/kg/min (09/02/17 0600)  . norepinephrine (LEVOPHED) Adult infusion 3.013 mcg/min (09/02/17 0600)    PRN Medications: sodium chloride, sodium chloride, acetaminophen, albuterol, ALPRAZolam, alum & mag hydroxide-simeth, guaiFENesin-dextromethorphan, nitroGLYCERIN, ondansetron (ZOFRAN) IV, simethicone, sodium chloride flush, sodium chloride flush, sodium chloride flush, traMADol    Patient Profile   Jonathan Wood is 47 year old with a history of NICM, HTN, ETOH abuse, smoker, asthma, and chronic systolic heart failure.   Assessment/Plan  1. Cardiogenic Shock- NICM had LHC 2018 with normal cors.  ECHO this admit EF 5-10%. RV moderately reduced.  - Diagnosed with HF about 1 year ago. Functional decline over the last few months.  -Volume status up a little. Give 40 mg IV lasix now. Repeat swan numbers later today.   - CO-OX 61%. Continue milrinone 0.25 mcg and norepi 3 mcg.  - Plan for IABP today to optimize prior to VAD. - . Hold off Arb/bb shock .  - Continue digoxin.  - Smoker- Not a candidate for transplant. Needs 6 months off nicotine.  -  VAD coordinators, HFSW, Palliative Care consulted and following along.  - Working on Therapist, occupational.   - Blood Type B+  - Palliative Care appreciated.    2. Shock Liver Elevated LFTs on admit.  Continues to improve.     3. CKD Stage II Renal function stable.   4.  Tobacco Abuse  Counseled to stop smoking   5. ETOH  Counseled to stop drinking all  together.   6. Gout Uric Acid 14.5 on steroids.   7. CAP On steroids + doxycyline.   8. ID:  CBC pending. On steroids.   9. Anxiety Started xanax and sertraline on 4/17 .    Medication concerns reviewed with patient and pharmacy team. Barriers identified: yes no insurance.   Length of Stay: 8  Amy Clegg, NP  09/02/2017, 8:01 AM  Advanced Heart Failure Team Pager [phone] (M-F; 7a - 4p)  Please contact [facility] for night-coverage after hours (4p -7a ) and weekends on [email or web address]  Agree with above.  He remains critically ill. Now on dual pressors. Swan numbers and co-ox improved. Still mildly fluid overloaded. Rhythm stable   On exam  In bed RIJ swan Cor RRR +s3 Lungs decreased at bases Ab soft NT/ND Ext trace edema. Warm  Remains critically ill but hemodynamics better today on dual inotropes. Plan for VAD on Tuesday. Will give IV alsix now and recheck co-ox and hemodynamics at noon. If stable will defer IABP until Monday. If dropping, will place IABP today. Start spiro. Continue, digoxin, norepi and milrinone. Not transplant candidate due to recent tobacco use. Stop prednisone now.   CRITICAL CARE Performed by: Arvilla Meres  Total critical care time: 35 minutes  Critical care time was exclusive of separately billable procedures and treating other patients.  Critical care was necessary to treat or prevent imminent or life-threatening deterioration.  Critical care was time spent personally by me (independent of midlevel providers or residents) on the following activities: development of treatment plan with patient and/or surrogate as well as nursing, discussions with consultants, evaluation of patient's response to treatment, examination of patient, obtaining history from patient or surrogate, ordering and performing treatments and interventions, ordering and review of laboratory studies, ordering and review of radiographic studies, pulse oximetry and  re-evaluation of patient's condition.  Arvilla Meres, MD  10:47 AM

## 2017-09-03 DIAGNOSIS — N170 Acute kidney failure with tubular necrosis: Secondary | ICD-10-CM

## 2017-09-03 LAB — COOXEMETRY PANEL
Carboxyhemoglobin: 1.5 % (ref 0.5–1.5)
METHEMOGLOBIN: 1.4 % (ref 0.0–1.5)
O2 SAT: 60.1 %
TOTAL HEMOGLOBIN: 11.3 g/dL — AB (ref 12.0–16.0)

## 2017-09-03 LAB — COMPREHENSIVE METABOLIC PANEL
ALT: 50 U/L (ref 17–63)
AST: 24 U/L (ref 15–41)
Albumin: 2.8 g/dL — ABNORMAL LOW (ref 3.5–5.0)
Alkaline Phosphatase: 85 U/L (ref 38–126)
Anion gap: 7 (ref 5–15)
BUN: 23 mg/dL — ABNORMAL HIGH (ref 6–20)
CO2: 23 mmol/L (ref 22–32)
Calcium: 9 mg/dL (ref 8.9–10.3)
Chloride: 106 mmol/L (ref 101–111)
Creatinine, Ser: 0.97 mg/dL (ref 0.61–1.24)
GFR calc Af Amer: >60 mL/min (ref >60)
GFR calc non Af Amer: >60 mL/min (ref >60)
Glucose, Bld: 130 mg/dL — ABNORMAL HIGH (ref 65–99)
Potassium: 4.3 mmol/L (ref 3.5–5.1)
Sodium: 136 mmol/L (ref 135–145)
Total Bilirubin: 0.9 mg/dL (ref 0.3–1.2)
Total Protein: 6.9 g/dL (ref 6.5–8.1)

## 2017-09-03 LAB — CBC
HCT: 34.5 % — ABNORMAL LOW (ref 39.0–52.0)
HEMOGLOBIN: 11.1 g/dL — AB (ref 13.0–17.0)
MCH: 28.5 pg (ref 26.0–34.0)
MCHC: 32.2 g/dL (ref 30.0–36.0)
MCV: 88.7 fL (ref 78.0–100.0)
PLATELETS: 214 10*3/uL (ref 150–400)
RBC: 3.89 MIL/uL — AB (ref 4.22–5.81)
RDW: 13.5 % (ref 11.5–15.5)
WBC: 11.7 10*3/uL — ABNORMAL HIGH (ref 4.0–10.5)

## 2017-09-03 MED ORDER — ENSURE ENLIVE PO LIQD
237.0000 mL | Freq: Three times a day (TID) | ORAL | Status: DC
  Administered 2017-09-03 – 2017-09-05 (×3): 237 mL via ORAL

## 2017-09-03 MED ORDER — ALLOPURINOL 300 MG PO TABS
150.0000 mg | ORAL_TABLET | Freq: Every day | ORAL | Status: DC
  Administered 2017-09-03 – 2017-09-05 (×3): 150 mg via ORAL
  Filled 2017-09-03 (×3): qty 1

## 2017-09-03 MED ORDER — ASPIRIN 81 MG PO CHEW: 81.0000 mg | CHEWABLE_TABLET | ORAL | Status: AC

## 2017-09-03 MED ORDER — SODIUM CHLORIDE 0.9 % IV SOLN: 250.0000 mL | INTRAVENOUS | Status: DC | PRN

## 2017-09-03 MED ORDER — SODIUM CHLORIDE 0.9% FLUSH: 3.0000 mL | INTRAVENOUS | Status: DC | PRN

## 2017-09-03 MED ORDER — FUROSEMIDE 10 MG/ML IJ SOLN
80.0000 mg | Freq: Once | INTRAMUSCULAR | Status: AC
  Administered 2017-09-03: 80 mg via INTRAVENOUS
  Filled 2017-09-03: qty 8

## 2017-09-03 MED ORDER — SODIUM CHLORIDE 0.9 % IV SOLN: INTRAVENOUS | Status: DC

## 2017-09-03 MED ORDER — SODIUM CHLORIDE 0.9% FLUSH
3.0000 mL | Freq: Two times a day (BID) | INTRAVENOUS | Status: DC
  Administered 2017-09-03: 3 mL via INTRAVENOUS

## 2017-09-03 NOTE — Progress Notes (Signed)
4 Days Post-Op Procedure(s) (LRB): RIGHT HEART CATH (N/A) Subjective: Patient remains hemodynamically stable on dual inotropes with severe LV dysfunction Ambulating in hallway Cardiac index remains greater than 2.2 with Co-ox stable at 60%  Objective: Vital signs in last 24 hours: Temp:  [97.5 F (36.4 C)-98.6 F (37 C)] 98.1 F (36.7 C) (04/20 0800) Cardiac Rhythm: Sinus tachycardia (04/20 0745) Resp:  [17-34] 32 (04/20 0800) BP: (87-126)/(47-98) 116/82 (04/20 0800) SpO2:  [90 %-100 %] 98 % (04/20 0800) Weight:  [190 lb 0.6 oz (86.2 kg)] 190 lb 0.6 oz (86.2 kg) (04/20 0500)  Hemodynamic parameters for last 24 hours: PAP: (38-66)/(12-39) 66/39 CVP:  [2 mmHg-11 mmHg] 11 mmHg PCWP:  [17 mmHg-26 mmHg] 26 mmHg CO:  [5 L/min-6.6 L/min] 5 L/min CI:  [2.9 L/min/m2-3.1 L/min/m2] 2.9 L/min/m2  Intake/Output from previous day: 04/19 0701 - 04/20 0700 In: 2194 [P.O.:1504; I.V.:690] Out: 1775 [Urine:1775] Intake/Output this shift: Total I/O In: 27.6 [I.V.:27.6] Out: 100 [Urine:100]       Exam    General- alert and comfortable    Neck- no JVD, no cervical adenopathy palpable, no carotid bruit   Lungs- clear without rales, wheezes   Cor- regular rate and rhythm, no murmur , gallop   Abdomen- soft, non-tender   Extremities - warm, non-tender, minimal edema   Neuro- oriented, appropriate, no focal weakness   Lab Results: Recent Labs    09/02/17 0803 09/03/17 0420  WBC 8.2 11.7*  HGB 11.2* 11.1*  HCT 35.4* 34.5*  PLT 217 214   BMET:  Recent Labs    09/02/17 0452 09/03/17 0420  NA 133* 136  K 3.9 4.3  CL 104 106  CO2 22 23  GLUCOSE 250* 130*  BUN 23* 23*  CREATININE 1.06 0.97  CALCIUM 8.3* 9.0    PT/INR: No results for input(s): LABPROT, INR in the last 72 hours. ABG    Component Value Date/Time   PHART 7.398 10/08/2016 0824   HCO3 24.6 08/30/2017 1428   HCO3 24.5 08/30/2017 1428   TCO2 26 08/30/2017 1428   TCO2 26 08/30/2017 1428   ACIDBASEDEF 1.0  08/30/2017 1428   ACIDBASEDEF 1.0 08/30/2017 1428   O2SAT 60.1 09/03/2017 0423   CBG (last 3)  No results for input(s): GLUCAP in the last 72 hours.  Assessment/Plan: S/P Procedure(s) (LRB): RIGHT HEART CATH (N/A) LVAD implant procedure discussed in detail with patient including expected benefits and potential risks. Plan removal of PA catheter and placement of balloon pump for preoperative mechanical support plan implant on Tuesday, April 23.   LOS: 9 days    Kathlee Nations Trigt III 09/03/2017

## 2017-09-03 NOTE — Plan of Care (Signed)
  Problem: Elimination: Goal: Will not experience complications related to bowel motility Outcome: Progressing Note:  Patient had bowel movement tonight. Goal: Will not experience complications related to urinary retention Outcome: Progressing

## 2017-09-03 NOTE — Progress Notes (Signed)
Advanced Heart Failure Rounding Note  PCP-Cardiologist: No primary care provider on file.   Subjective:    Remains on NE 3. Milrinone 0.25   Feels better. No orthopnea or PND. Able to walk hall but gets fatigued. Renal function has normalized.   CVP 11 CO-OX 60%.  PAP 41/15 CO 5.0 CI 2.4    Objective:   Weight Range: 86.2 kg (190 lb 0.6 oz) Body mass index is 24.4 kg/m.   Vital Signs:   Temp:  [97.5 F (36.4 C)-98.6 F (37 C)] 98.1 F (36.7 C) (04/20 0800) Resp:  [17-34] 32 (04/20 0800) BP: (87-126)/(47-98) 116/82 (04/20 0800) SpO2:  [90 %-100 %] 98 % (04/20 0800) Weight:  [86.2 kg (190 lb 0.6 oz)] 86.2 kg (190 lb 0.6 oz) (04/20 0500) Last BM Date: 09/02/17  Weight change: Filed Weights   09/01/17 0500 09/02/17 0500 09/03/17 0500  Weight: 85.6 kg (188 lb 11.4 oz) 85.4 kg (188 lb 3.2 oz) 86.2 kg (190 lb 0.6 oz)    Intake/Output:   Intake/Output Summary (Last 24 hours) at 09/03/2017 0939 Last data filed at 09/03/2017 0800 Gross per 24 hour  Intake 2166.4 ml  Output 1875 ml  Net 291.4 ml      Physical Exam   General:  Lying in bed  No resp difficulty HEENT: normal Neck: supple. RIJ swan. JVP 10-11 Carotids 2+ bilat; no bruits. No lymphadenopathy or thryomegaly appreciated. Cor: PMI laterally displaced. Regular rate & rhythm. +s3 Lungs: clear Abdomen: soft, nontender, nondistended. No hepatosplenomegaly. No bruits or masses. Good bowel sounds. Extremities: no cyanosis, clubbing, rash, edema Neuro: alert & orientedx3, cranial nerves grossly intact. moves all 4 extremities w/o difficulty. Affect pleasant  Telemetry   Sinus 90-100 Personally reviewed   EKG    n/a  Labs    CBC Recent Labs    09/02/17 0803 09/03/17 0420  WBC 8.2 11.7*  HGB 11.2* 11.1*  HCT 35.4* 34.5*  MCV 89.6 88.7  PLT 217 214   Basic Metabolic Panel Recent Labs    16/10/96 1256  09/02/17 0452 09/03/17 0420  NA  --    < > 133* 136  K  --    < > 3.9 4.3  CL  --     < > 104 106  CO2  --    < > 22 23  GLUCOSE  --    < > 250* 130*  BUN  --    < > 23* 23*  CREATININE  --    < > 1.06 0.97  CALCIUM  --    < > 8.3* 9.0  MG 2.3  --   --   --    < > = values in this interval not displayed.   Liver Function Tests Recent Labs    09/02/17 0452 09/03/17 0420  AST 25 24  ALT 55 50  ALKPHOS 82 85  BILITOT 0.9 0.9  PROT 6.4* 6.9  ALBUMIN 2.7* 2.8*   No results for input(s): LIPASE, AMYLASE in the last 72 hours. Cardiac Enzymes No results for input(s): CKTOTAL, CKMB, CKMBINDEX, TROPONINI in the last 72 hours.  BNP: BNP (last 3 results) Recent Labs    12/07/16 1838 01/28/17 1412 08/25/17 1337  BNP 1,321.0* 2,506.0* 2,510.0*    ProBNP (last 3 results) No results for input(s): PROBNP in the last 8760 hours.   D-Dimer No results for input(s): DDIMER in the last 72 hours. Hemoglobin A1C No results for input(s): HGBA1C in the last 72 hours.  Fasting Lipid Panel No results for input(s): CHOL, HDL, LDLCALC, TRIG, CHOLHDL, LDLDIRECT in the last 72 hours. Thyroid Function Tests No results for input(s): TSH, T4TOTAL, T3FREE, THYROIDAB in the last 72 hours.  Invalid input(s): FREET3  Other results:   Imaging    No results found.   Medications:     Scheduled Medications: . aspirin EC  81 mg Oral Daily  . benzonatate  100 mg Oral TID  . Chlorhexidine Gluconate Cloth  6 each Topical Daily  . digoxin  0.125 mg Oral Daily  . enoxaparin (LOVENOX) injection  40 mg Subcutaneous Q24H  . feeding supplement (ENSURE ENLIVE)  237 mL Oral TID WC  . guaiFENesin  600 mg Oral BID  . Living Better with Heart Failure Book   Does not apply Once  . polyethylene glycol  17 g Oral Daily  . potassium chloride  10 mEq Oral BID  . sertraline  25 mg Oral Daily  . sodium chloride flush  10-40 mL Intracatheter Q12H  . sodium chloride flush  3 mL Intravenous Q12H  . spironolactone  12.5 mg Oral Daily  . traZODone  50 mg Oral QHS    Infusions: . sodium  chloride 10 mL/hr at 09/03/17 0800  . milrinone 0.25 mcg/kg/min (09/03/17 0800)  . norepinephrine (LEVOPHED) Adult infusion 3.013 mcg/min (09/03/17 0800)    PRN Medications: sodium chloride, acetaminophen, albuterol, ALPRAZolam, alum & mag hydroxide-simeth, guaiFENesin-dextromethorphan, methocarbamol, nitroGLYCERIN, ondansetron (ZOFRAN) IV, simethicone, sodium chloride flush, sodium chloride flush, traMADol    Patient Profile   Jonathan Wood is 47 year old with a history of NICM, HTN, ETOH abuse, smoker, asthma, and chronic systolic heart failure.   Assessment/Plan   1. Cardiogenic Shock- NICM had LHC 2018 with normal cors.  - ECHO this admit EF 5-10%. RV moderately reduced.  - Shock stabilized on dual inotropes but remains tenuous  - Volume status up  - Resume IV lasix. - Continue digoxin and spiro  - Smoker- Not a candidate for transplant. Needs 6 months off nicotine.  -  VAD coordinators, HFSW, Palliative Care consulted and following along.  - Working on Therapist, occupational.   - Blood Type B+  - Palliative Care appreciated.   - Dr. Donata Clay has seen today. Plan for tentative VAD on Tuesday. Will place pre-op IABP on Monday. D/w Dr. Donata Clay personally at bedside. Will pull swan   2. Shock Liver - resolved with hemodynamic support   3. AKI II - resolved with hemodynamic support   4.  Tobacco Abuse  Counseled to stop smoking   5. ETOH  Counseled to stop drinking all together.   6. Gout Uric Acid 14.5.t reated with 3 days of steroids. Now off. Start allopurinol  7. CAP -Continue doxycyline.   8. Anxiety Started xanax and sertraline on 4/17 .    CRITICAL CARE Performed by: Arvilla Meres  Total critical care time: 35 minutes  Critical care time was exclusive of separately billable procedures and treating other patients.  Critical care was necessary to treat or prevent imminent or life-threatening deterioration.  Critical care was time spent personally  by me (independent of midlevel providers or residents) on the following activities: development of treatment plan with patient and/or surrogate as well as nursing, discussions with consultants, evaluation of patient's response to treatment, examination of patient, obtaining history from patient or surrogate, ordering and performing treatments and interventions, ordering and review of laboratory studies, ordering and review of radiographic studies, pulse oximetry and re-evaluation of patient's  condition.   Length of Stay: 9  Arvilla Meres, MD  09/03/2017, 9:39 AM  Advanced Heart Failure Team Pager [phone] (M-F; 7a - 4p)  Please contact [facility] for night-coverage after hours (4p -7a ) and weekends on [email or web address]

## 2017-09-03 NOTE — Plan of Care (Signed)
Pt is able to turn self in bed and ambulated several times during day shift per report. Pt is currently maintaining MAP>65 w/ low dose vasopressor support and inotropic support.

## 2017-09-03 NOTE — Plan of Care (Signed)
PA cath out, ambulating in room. Diuresing with lasix. Relaxation techniques for destress. Plan possible IABP Monday, VAD tuesday

## 2017-09-03 NOTE — Progress Notes (Signed)
Ref: Patient, No Pcp Per   Subjective:  Moderate respiratory distress continues. On NE 3 mcg/min and milrinone 0.25 mcg/kg/min drip. CVP-11 cm, CO-OX 60 %, PAP-41/15, CO-5.0 and CI-2.4 L/min. Off swan Ganz catheter.  Objective:  Vital Signs in the last 24 hours: Temp:  [97.5 F (36.4 C)-98.6 F (37 C)] 98.1 F (36.7 C) (04/20 0800) Cardiac Rhythm: Sinus tachycardia (04/20 0745) Resp:  [17-34] 32 (04/20 0800) BP: (94-126)/(47-96) 116/82 (04/20 0800) SpO2:  [90 %-100 %] 98 % (04/20 0800) Weight:  [86.2 kg (190 lb 0.6 oz)] 86.2 kg (190 lb 0.6 oz) (04/20 0500)  Physical Exam: BP Readings from Last 1 Encounters:  09/03/17 116/82     Wt Readings from Last 1 Encounters:  09/03/17 86.2 kg (190 lb 0.6 oz)    Weight change: 0.833 kg (1 lb 13.4 oz) Body mass index is 24.4 kg/m. HEENT: [place]/AT, Eyes-Brown, PERL, EOMI, Conjunctiva-Pink, Sclera-Non-icteric Neck: No JVD, No bruit, Trachea midline. Lungs:  Clear, Bilateral. Cardiac:  Regular rhythm, normal S1 and S2, no S3. II/VI systolic murmur. Abdomen:  Soft, non-tender. BS present. Extremities:  No edema present. No cyanosis. No clubbing. CNS: AxOx3, Cranial nerves grossly intact, moves all 4 extremities.  Skin: Warm and dry.   Intake/Output from previous day: 04/19 0701 - 04/20 0700 In: 2194 [P.O.:1504; I.V.:690] Out: 1775 [Urine:1775]    Lab Results: BMET    Component Value Date/Time   NA 136 09/03/2017 0420   NA 133 (L) 09/02/2017 0452   NA 132 (L) 09/01/2017 0414   K 4.3 09/03/2017 0420   K 3.9 09/02/2017 0452   K 4.3 09/01/2017 0414   CL 106 09/03/2017 0420   CL 104 09/02/2017 0452   CL 103 09/01/2017 0414   CO2 23 09/03/2017 0420   CO2 22 09/02/2017 0452   CO2 24 09/01/2017 0414   GLUCOSE 130 (H) 09/03/2017 0420   GLUCOSE 250 (H) 09/02/2017 0452   GLUCOSE 118 (H) 09/01/2017 0414   BUN 23 (H) 09/03/2017 0420   BUN 23 (H) 09/02/2017 0452   BUN 25 (H) 09/01/2017 0414   CREATININE 0.97 09/03/2017 0420   CREATININE 1.06 09/02/2017 0452   CREATININE 0.96 09/01/2017 0414   CALCIUM 9.0 09/03/2017 0420   CALCIUM 8.3 (L) 09/02/2017 0452   CALCIUM 8.6 (L) 09/01/2017 0414   GFRNONAA >60 09/03/2017 0420   GFRNONAA >60 09/02/2017 0452   GFRNONAA >60 09/01/2017 0414   GFRAA >60 09/03/2017 0420   GFRAA >60 09/02/2017 0452   GFRAA >60 09/01/2017 0414   CBC    Component Value Date/Time   WBC 11.7 (H) 09/03/2017 0420   RBC 3.89 (L) 09/03/2017 0420   HGB 11.1 (L) 09/03/2017 0420   HCT 34.5 (L) 09/03/2017 0420   PLT 214 09/03/2017 0420   MCV 88.7 09/03/2017 0420   MCH 28.5 09/03/2017 0420   MCHC 32.2 09/03/2017 0420   RDW 13.5 09/03/2017 0420   LYMPHSABS 5.6 (H) 10/08/2016 0332   MONOABS 0.9 10/08/2016 0332   EOSABS 0.3 10/08/2016 0332   BASOSABS 0.0 10/08/2016 0332   HEPATIC Function Panel Recent Labs    09/01/17 0414 09/02/17 0452 09/03/17 0420  PROT 6.9 6.4* 6.9   HEMOGLOBIN A1C No components found for: HGA1C,  MPG CARDIAC ENZYMES Lab Results  Component Value Date   TROPONINI 0.04 (HH) 08/28/2017   TROPONINI 0.05 (HH) 08/26/2017   TROPONINI 0.05 (HH) 08/26/2017   BNP No results for input(s): PROBNP in the last 8760 hours. TSH Recent Labs  08/30/17 1237  TSH 1.957   CHOLESTEROL Recent Labs    08/30/17 1237  CHOL 111    Scheduled Meds: . aspirin EC  81 mg Oral Daily  . benzonatate  100 mg Oral TID  . Chlorhexidine Gluconate Cloth  6 each Topical Daily  . digoxin  0.125 mg Oral Daily  . enoxaparin (LOVENOX) injection  40 mg Subcutaneous Q24H  . feeding supplement (ENSURE ENLIVE)  237 mL Oral TID WC  . furosemide  80 mg Intravenous Once  . guaiFENesin  600 mg Oral BID  . Living Better with Heart Failure Book   Does not apply Once  . polyethylene glycol  17 g Oral Daily  . potassium chloride  10 mEq Oral BID  . sertraline  25 mg Oral Daily  . sodium chloride flush  10-40 mL Intracatheter Q12H  . sodium chloride flush  3 mL Intravenous Q12H  . spironolactone   12.5 mg Oral Daily  . traZODone  50 mg Oral QHS   Continuous Infusions: . sodium chloride 10 mL/hr at 09/03/17 0800  . milrinone 0.25 mcg/kg/min (09/03/17 0800)  . 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   LOS: 9 days    Orpah Cobb  MD  09/03/2017, 12:06 PM

## 2017-09-03 NOTE — Progress Notes (Signed)
Diuresing well from lasix. Having periods of sadness, states he is scared of surgery. Refusing zoloft, taking xanax for anxiety.. Family in to visit. Talked with patient about what to expect, education available via tv. Will let me know when he is ready to view.

## 2017-09-04 ENCOUNTER — Inpatient Hospital Stay (HOSPITAL_COMMUNITY): Payer: Medicaid Other

## 2017-09-04 LAB — COMPREHENSIVE METABOLIC PANEL
ALT: 42 U/L (ref 17–63)
ANION GAP: 7 (ref 5–15)
AST: 20 U/L (ref 15–41)
Albumin: 2.9 g/dL — ABNORMAL LOW (ref 3.5–5.0)
Alkaline Phosphatase: 79 U/L (ref 38–126)
BUN: 29 mg/dL — ABNORMAL HIGH (ref 6–20)
CHLORIDE: 105 mmol/L (ref 101–111)
CO2: 23 mmol/L (ref 22–32)
Calcium: 8.8 mg/dL — ABNORMAL LOW (ref 8.9–10.3)
Creatinine, Ser: 1.25 mg/dL — ABNORMAL HIGH (ref 0.61–1.24)
GFR calc non Af Amer: >60 mL/min (ref >60)
Glucose, Bld: 147 mg/dL — ABNORMAL HIGH (ref 65–99)
Potassium: 4.5 mmol/L (ref 3.5–5.1)
SODIUM: 135 mmol/L (ref 135–145)
Total Bilirubin: 1.3 mg/dL — ABNORMAL HIGH (ref 0.3–1.2)
Total Protein: 7 g/dL (ref 6.5–8.1)

## 2017-09-04 LAB — CBC
HCT: 34.6 % — ABNORMAL LOW (ref 39.0–52.0)
Hemoglobin: 11.1 g/dL — ABNORMAL LOW (ref 13.0–17.0)
MCH: 28.2 pg (ref 26.0–34.0)
MCHC: 32.1 g/dL (ref 30.0–36.0)
MCV: 88 fL (ref 78.0–100.0)
PLATELETS: 205 10*3/uL (ref 150–400)
RBC: 3.93 MIL/uL — ABNORMAL LOW (ref 4.22–5.81)
RDW: 13.2 % (ref 11.5–15.5)
WBC: 10.1 10*3/uL (ref 4.0–10.5)

## 2017-09-04 LAB — COOXEMETRY PANEL
CARBOXYHEMOGLOBIN: 1.5 % (ref 0.5–1.5)
Methemoglobin: 1.5 % (ref 0.0–1.5)
O2 SAT: 52.7 %
Total hemoglobin: 11 g/dL — ABNORMAL LOW (ref 12.0–16.0)

## 2017-09-04 LAB — MAGNESIUM: Magnesium: 1.8 mg/dL (ref 1.7–2.4)

## 2017-09-04 MED ORDER — SODIUM CHLORIDE 0.9% FLUSH
3.0000 mL | Freq: Two times a day (BID) | INTRAVENOUS | Status: DC
  Administered 2017-09-04: 3 mL via INTRAVENOUS

## 2017-09-04 MED ORDER — FUROSEMIDE 10 MG/ML IJ SOLN
80.0000 mg | Freq: Two times a day (BID) | INTRAMUSCULAR | Status: DC
  Administered 2017-09-04 – 2017-09-05 (×4): 80 mg via INTRAVENOUS
  Filled 2017-09-04 (×4): qty 8

## 2017-09-04 MED ORDER — SODIUM CHLORIDE 0.9% FLUSH: 3.0000 mL | INTRAVENOUS | Status: DC | PRN

## 2017-09-04 MED ORDER — MAGNESIUM SULFATE 2 GM/50ML IV SOLN
2.0000 g | Freq: Once | INTRAVENOUS | Status: AC
  Administered 2017-09-04: 2 g via INTRAVENOUS
  Filled 2017-09-04: qty 50

## 2017-09-04 MED ORDER — MILRINONE LACTATE IN DEXTROSE 20-5 MG/100ML-% IV SOLN
0.3750 ug/kg/min | INTRAVENOUS | Status: DC
  Administered 2017-09-04 – 2017-09-06 (×4): 0.375 ug/kg/min via INTRAVENOUS
  Filled 2017-09-04 (×4): qty 100

## 2017-09-04 MED ORDER — SODIUM CHLORIDE 0.9 % IV SOLN: 250.0000 mL | INTRAVENOUS | Status: DC | PRN

## 2017-09-04 MED ORDER — MAGNESIUM SULFATE 2 GM/50ML IV SOLN: 2.0000 g | Freq: Once | INTRAVENOUS | Status: DC

## 2017-09-04 MED ORDER — SODIUM CHLORIDE 0.9 % IV SOLN: INTRAVENOUS | Status: DC

## 2017-09-04 MED ORDER — ASPIRIN 81 MG PO CHEW
81.0000 mg | CHEWABLE_TABLET | ORAL | Status: AC
  Administered 2017-09-05: 81 mg via ORAL
  Filled 2017-09-04: qty 1

## 2017-09-04 NOTE — Progress Notes (Signed)
Ref: Patient, No Pcp Per   Subjective:  Feeling better. Decreasing respiratory distress. CO-OX 52.7 %. CBC stable. Creatinine 1.25.   Objective:  Vital Signs in the last 24 hours: Temp:  [97.9 F (36.6 C)-98.2 F (36.8 C)] 97.9 F (36.6 C) (04/21 0300) Pulse Rate:  [93-110] 94 (04/21 0500) Cardiac Rhythm: Normal sinus rhythm (04/21 0400) Resp:  [20-33] 20 (04/21 0700) BP: (85-126)/(59-91) 95/60 (04/21 0700) SpO2:  [95 %-100 %] 97 % (04/21 0700) Weight:  [86.5 kg (190 lb 11.2 oz)] 86.5 kg (190 lb 11.2 oz) (04/21 0500)  Physical Exam: BP Readings from Last 1 Encounters:  09/04/17 95/60     Wt Readings from Last 1 Encounters:  09/04/17 86.5 kg (190 lb 11.2 oz)    Weight change: 0.301 kg (10.6 oz) Body mass index is 24.48 kg/m. HEENT: [place]/AT, Eyes-Brown, PERL, EOMI, Conjunctiva-Pink, Sclera-Non-icteric Neck: No JVD, No bruit, Trachea midline. Lungs:  Clear, Bilateral. Cardiac:  Regular rhythm, normal S1 and S2, no S3. II/VI systolic murmur. Abdomen:  Soft, non-tender. BS present. Extremities:  No edema present. No cyanosis. No clubbing. CNS: AxOx3, Cranial nerves grossly intact, moves all 4 extremities.  Skin: Warm and dry.   Intake/Output from previous day: 04/20 0701 - 04/21 0700 In: 819.6 [P.O.:240; I.V.:579.6] Out: 3325 [Urine:3325]    Lab Results: BMET    Component Value Date/Time   NA 135 09/04/2017 0448   NA 136 09/03/2017 0420   NA 133 (L) 09/02/2017 0452   K 4.5 09/04/2017 0448   K 4.3 09/03/2017 0420   K 3.9 09/02/2017 0452   CL 105 09/04/2017 0448   CL 106 09/03/2017 0420   CL 104 09/02/2017 0452   CO2 23 09/04/2017 0448   CO2 23 09/03/2017 0420   CO2 22 09/02/2017 0452   GLUCOSE 147 (H) 09/04/2017 0448   GLUCOSE 130 (H) 09/03/2017 0420   GLUCOSE 250 (H) 09/02/2017 0452   BUN 29 (H) 09/04/2017 0448   BUN 23 (H) 09/03/2017 0420   BUN 23 (H) 09/02/2017 0452   CREATININE 1.25 (H) 09/04/2017 0448   CREATININE 0.97 09/03/2017 0420   CREATININE 1.06  09/02/2017 0452   CALCIUM 8.8 (L) 09/04/2017 0448   CALCIUM 9.0 09/03/2017 0420   CALCIUM 8.3 (L) 09/02/2017 0452   GFRNONAA >60 09/04/2017 0448   GFRNONAA >60 09/03/2017 0420   GFRNONAA >60 09/02/2017 0452   GFRAA >60 09/04/2017 0448   GFRAA >60 09/03/2017 0420   GFRAA >60 09/02/2017 0452   CBC    Component Value Date/Time   WBC 10.1 09/04/2017 0448   RBC 3.93 (L) 09/04/2017 0448   HGB 11.1 (L) 09/04/2017 0448   HCT 34.6 (L) 09/04/2017 0448   PLT 205 09/04/2017 0448   MCV 88.0 09/04/2017 0448   MCH 28.2 09/04/2017 0448   MCHC 32.1 09/04/2017 0448   RDW 13.2 09/04/2017 0448   LYMPHSABS 5.6 (H) 10/08/2016 0332   MONOABS 0.9 10/08/2016 0332   EOSABS 0.3 10/08/2016 0332   BASOSABS 0.0 10/08/2016 0332   HEPATIC Function Panel Recent Labs    09/02/17 0452 09/03/17 0420 09/04/17 0448  PROT 6.4* 6.9 7.0   HEMOGLOBIN A1C No components found for: HGA1C,  MPG CARDIAC ENZYMES Lab Results  Component Value Date   TROPONINI 0.04 (HH) 08/28/2017   TROPONINI 0.05 (HH) 08/26/2017   TROPONINI 0.05 (HH) 08/26/2017   BNP No results for input(s): PROBNP in the last 8760 hours. TSH Recent Labs    08/30/17 1237  TSH 1.957   CHOLESTEROL  Recent Labs    08/30/17 1237  CHOL 111    Scheduled Meds: . allopurinol  150 mg Oral Daily  . aspirin  81 mg Oral Pre-Cath  . aspirin EC  81 mg Oral Daily  . benzonatate  100 mg Oral TID  . Chlorhexidine Gluconate Cloth  6 each Topical Daily  . digoxin  0.125 mg Oral Daily  . enoxaparin (LOVENOX) injection  40 mg Subcutaneous Q24H  . feeding supplement (ENSURE ENLIVE)  237 mL Oral TID WC  . guaiFENesin  600 mg Oral BID  . Living Better with Heart Failure Book   Does not apply Once  . polyethylene glycol  17 g Oral Daily  . potassium chloride  10 mEq Oral BID  . sertraline  25 mg Oral Daily  . sodium chloride flush  10-40 mL Intracatheter Q12H  . sodium chloride flush  3 mL Intravenous Q12H  . sodium chloride flush  3 mL Intravenous  Q12H  . spironolactone  12.5 mg Oral Daily  . traZODone  50 mg Oral QHS   Continuous Infusions: . sodium chloride 10 mL/hr at 09/03/17 2000  . sodium chloride    . sodium chloride    . magnesium sulfate 1 - 4 g bolus IVPB 2 g (09/04/17 1055)  . milrinone 0.25 mcg/kg/min (09/03/17 2000)  . 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   LOS: 10 days    Orpah Cobb  MD  09/04/2017, 10:56 AM

## 2017-09-04 NOTE — Plan of Care (Signed)
Patient coughing more today, decreasing some after lasix, dyspnia on excertion, no change from previous days. Continues on room air, co-ox number looks a littlee worse, increased milnerone and levophed.  For IABP pump tomorrow, and LVAD Tuesday.

## 2017-09-04 NOTE — H&P (View-Only) (Signed)
Advanced Heart Failure Rounding Note  PCP-Cardiologist: No primary care provider on file.   Subjective:    Milrinone 0.25 NE increased to 4 this am for low BP.  SBP now 90-100  Feels fatigued but denies SOB, orthopnea or PND. Ernestine Conrad out. Co-ox 53% CVP 18-19 (checked personally)   Objective:   Weight Range: 86.5 kg (190 lb 11.2 oz) Body mass index is 24.48 kg/m.   Vital Signs:   Temp:  [97.9 F (36.6 C)-98.2 F (36.8 C)] 97.9 F (36.6 C) (04/21 0300) Pulse Rate:  [93-110] 94 (04/21 0500) Resp:  [20-33] 20 (04/21 0700) BP: (85-126)/(59-91) 95/60 (04/21 0700) SpO2:  [95 %-100 %] 97 % (04/21 0700) Weight:  [86.5 kg (190 lb 11.2 oz)] 86.5 kg (190 lb 11.2 oz) (04/21 0500) Last BM Date: 09/03/17  Weight change: Filed Weights   09/02/17 0500 09/03/17 0500 09/04/17 0500  Weight: 85.4 kg (188 lb 3.2 oz) 86.2 kg (190 lb 0.6 oz) 86.5 kg (190 lb 11.2 oz)    Intake/Output:   Intake/Output Summary (Last 24 hours) at 09/04/2017 1055 Last data filed at 09/04/2017 0400 Gross per 24 hour  Intake 496.8 ml  Output 3225 ml  Net -2728.2 ml      Physical Exam   General:  Sitting up in bed. No resp difficulty HEENT: normal Neck: supple. JVP to ear. Carotids 2+ bilat; no bruits. No lymphadenopathy or thryomegaly appreciated. Cor: PMI laterally displaced. Regular rate & rhythm. +s3 Lungs: clear Abdomen: soft, nontender, nondistended. No hepatosplenomegaly. No bruits or masses. Good bowel sounds. Extremities: no cyanosis, clubbing, rash, trace edema Neuro: alert & orientedx3, cranial nerves grossly intact. moves all 4 extremities w/o difficulty. Affect pleasant   Telemetry   Sinus 90-100 Personally reviewed   EKG    n/a  Labs    CBC Recent Labs    09/03/17 0420 09/04/17 0448  WBC 11.7* 10.1  HGB 11.1* 11.1*  HCT 34.5* 34.6*  MCV 88.7 88.0  PLT 214 205   Basic Metabolic Panel Recent Labs    69/62/95 0420 09/04/17 0448  NA 136 135  K 4.3 4.5  CL 106 105    CO2 23 23  GLUCOSE 130* 147*  BUN 23* 29*  CREATININE 0.97 1.25*  CALCIUM 9.0 8.8*  MG  --  1.8   Liver Function Tests Recent Labs    09/03/17 0420 09/04/17 0448  AST 24 20  ALT 50 42  ALKPHOS 85 79  BILITOT 0.9 1.3*  PROT 6.9 7.0  ALBUMIN 2.8* 2.9*   No results for input(s): LIPASE, AMYLASE in the last 72 hours. Cardiac Enzymes No results for input(s): CKTOTAL, CKMB, CKMBINDEX, TROPONINI in the last 72 hours.  BNP: BNP (last 3 results) Recent Labs    12/07/16 1838 01/28/17 1412 08/25/17 1337  BNP 1,321.0* 2,506.0* 2,510.0*    ProBNP (last 3 results) No results for input(s): PROBNP in the last 8760 hours.   D-Dimer No results for input(s): DDIMER in the last 72 hours. Hemoglobin A1C No results for input(s): HGBA1C in the last 72 hours. Fasting Lipid Panel No results for input(s): CHOL, HDL, LDLCALC, TRIG, CHOLHDL, LDLDIRECT in the last 72 hours. Thyroid Function Tests No results for input(s): TSH, T4TOTAL, T3FREE, THYROIDAB in the last 72 hours.  Invalid input(s): FREET3  Other results:   Imaging    Dg Chest Port 1 View  Result Date: 09/04/2017 CLINICAL DATA:  Shortness of breath.  Smoker. EXAM: PORTABLE CHEST 1 VIEW COMPARISON:  08/30/2017. FINDINGS: Stable  enlarged cardiac silhouette. The right jugular Swan-Ganz catheter has been removed. Right PICC tip in the inferior aspect of the superior vena cava. Clear lungs with normal vascularity. The lungs are mildly hyperexpanded with mild central peribronchial thickening and accentuation of the interstitial markings. Unremarkable bones. IMPRESSION: 1. Stable cardiomegaly. 2. Stable changes of COPD and chronic bronchitis. 3. Right PICC tip in the superior vena cava. This could be advanced 3 cm to place it at the superior cavoatrial junction if desired. Electronically Signed   By: Beckie Salts M.D.   On: 09/04/2017 07:27     Medications:     Scheduled Medications: . allopurinol  150 mg Oral Daily  .  aspirin  81 mg Oral Pre-Cath  . aspirin EC  81 mg Oral Daily  . benzonatate  100 mg Oral TID  . Chlorhexidine Gluconate Cloth  6 each Topical Daily  . digoxin  0.125 mg Oral Daily  . enoxaparin (LOVENOX) injection  40 mg Subcutaneous Q24H  . feeding supplement (ENSURE ENLIVE)  237 mL Oral TID WC  . guaiFENesin  600 mg Oral BID  . Living Better with Heart Failure Book   Does not apply Once  . polyethylene glycol  17 g Oral Daily  . potassium chloride  10 mEq Oral BID  . sertraline  25 mg Oral Daily  . sodium chloride flush  10-40 mL Intracatheter Q12H  . sodium chloride flush  3 mL Intravenous Q12H  . sodium chloride flush  3 mL Intravenous Q12H  . spironolactone  12.5 mg Oral Daily  . traZODone  50 mg Oral QHS    Infusions: . sodium chloride 10 mL/hr at 09/03/17 2000  . sodium chloride    . sodium chloride    . magnesium sulfate 1 - 4 g bolus IVPB    . milrinone 0.25 mcg/kg/min (09/03/17 2000)  . norepinephrine (LEVOPHED) Adult infusion 3.013 mcg/min (09/03/17 2000)    PRN Medications: sodium chloride, sodium chloride, acetaminophen, albuterol, ALPRAZolam, alum & mag hydroxide-simeth, guaiFENesin-dextromethorphan, methocarbamol, nitroGLYCERIN, ondansetron (ZOFRAN) IV, simethicone, sodium chloride flush, sodium chloride flush, sodium chloride flush, traMADol    Patient Profile   Ms Douget is 47 year old with a history of NICM, HTN, ETOH abuse, smoker, asthma, and chronic systolic heart failure.   Assessment/Plan   1. Cardiogenic Shock- NICM had LHC 2018 with normal cors.  - ECHO this admit EF 5-10%. RV moderately reduced.   - Continues to be very tenuous despite milrinone and NE. CVP up to 18-19. Co-ox low - Increase milrinone to 0.375. Continue NE -> titrate as needed - Resume IV lasix  - Plan IABP tomorrow - Continue digoxin and spiro  - Smoker- Not a candidate for transplant. Needs 6 months off nicotine.  -  VAD coordinators, HFSW, Palliative Care consulted and  following along.  - Working on Therapist, occupational.   - Blood Type B+  - Palliative Care appreciated.   - Dr. Donata Clay has seen. Plan for tentative VAD on Tuesday. Will place pre-op IABP tomorrow.   2. Shock Liver - resolved with hemodynamic support   3. AKI II - resolved with hemodynamic support   4.  Tobacco Abuse  Counseled to stop smoking   5. ETOH  Counseled to stop drinking all together.   6. Gout Uric Acid 14.5 treated with 3 days of steroids. Now off. Now on allopurinol  7. CAP -Continue doxycyline.   8. Anxiety Started xanax and sertraline on 4/17 .    CRITICAL CARE  Performed by: Arvilla Meres  Total critical care time: 35 minutes  Critical care time was exclusive of separately billable procedures and treating other patients.  Critical care was necessary to treat or prevent imminent or life-threatening deterioration.  Critical care was time spent personally by me (independent of midlevel providers or residents) on the following activities: development of treatment plan with patient and/or surrogate as well as nursing, discussions with consultants, evaluation of patient's response to treatment, examination of patient, obtaining history from patient or surrogate, ordering and performing treatments and interventions, ordering and review of laboratory studies, ordering and review of radiographic studies, pulse oximetry and re-evaluation of patient's condition.    Length of Stay: 10  Arvilla Meres, MD  09/04/2017, 10:55 AM  Advanced Heart Failure Team Pager [phone] (M-F; 7a - 4p)  Please contact [facility] for night-coverage after hours (4p -7a ) and weekends on [email or web address]

## 2017-09-04 NOTE — Plan of Care (Signed)
  Problem: Health Behavior/Discharge Planning: Goal: Ability to manage health-related needs will improve Outcome: Progressing   Problem: Clinical Measurements: Goal: Will remain free from infection Outcome: Progressing Goal: Diagnostic test results will improve Outcome: Progressing Goal: Respiratory complications will improve Outcome: Progressing   Problem: Elimination: Goal: Will not experience complications related to bowel motility Outcome: Progressing Goal: Will not experience complications related to urinary retention Outcome: Progressing   Problem: Education: Goal: Understanding of CV disease, CV risk reduction, and recovery process will improve Outcome: Progressing   Problem: Activity: Goal: Risk for activity intolerance will decrease Outcome: Not Progressing

## 2017-09-04 NOTE — Progress Notes (Signed)
Advanced Heart Failure Rounding Note  PCP-Cardiologist: No primary care provider on file.   Subjective:    Milrinone 0.25 NE increased to 4 this am for low BP.  SBP now 90-100  Feels fatigued but denies SOB, orthopnea or PND. Jonathan Wood out. Co-ox 53% CVP 18-19 (checked personally)   Objective:   Weight Range: 86.5 kg (190 lb 11.2 oz) Body mass index is 24.48 kg/m.   Vital Signs:   Temp:  [97.9 F (36.6 C)-98.2 F (36.8 C)] 97.9 F (36.6 C) (04/21 0300) Pulse Rate:  [93-110] 94 (04/21 0500) Resp:  [20-33] 20 (04/21 0700) BP: (85-126)/(59-91) 95/60 (04/21 0700) SpO2:  [95 %-100 %] 97 % (04/21 0700) Weight:  [86.5 kg (190 lb 11.2 oz)] 86.5 kg (190 lb 11.2 oz) (04/21 0500) Last BM Date: 09/03/17  Weight change: Filed Weights   09/02/17 0500 09/03/17 0500 09/04/17 0500  Weight: 85.4 kg (188 lb 3.2 oz) 86.2 kg (190 lb 0.6 oz) 86.5 kg (190 lb 11.2 oz)    Intake/Output:   Intake/Output Summary (Last 24 hours) at 09/04/2017 1055 Last data filed at 09/04/2017 0400 Gross per 24 hour  Intake 496.8 ml  Output 3225 ml  Net -2728.2 ml      Physical Exam   General:  Sitting up in bed. No resp difficulty HEENT: normal Neck: supple. JVP to ear. Carotids 2+ bilat; no bruits. No lymphadenopathy or thryomegaly appreciated. Cor: PMI laterally displaced. Regular rate & rhythm. +s3 Lungs: clear Abdomen: soft, nontender, nondistended. No hepatosplenomegaly. No bruits or masses. Good bowel sounds. Extremities: no cyanosis, clubbing, rash, trace edema Neuro: alert & orientedx3, cranial nerves grossly intact. moves all 4 extremities w/o difficulty. Affect pleasant   Telemetry   Sinus 90-100 Personally reviewed   EKG    n/a  Labs    CBC Recent Labs    09/03/17 0420 09/04/17 0448  WBC 11.7* 10.1  HGB 11.1* 11.1*  HCT 34.5* 34.6*  MCV 88.7 88.0  PLT 214 205   Basic Metabolic Panel Recent Labs    69/62/95 0420 09/04/17 0448  NA 136 135  K 4.3 4.5  CL 106 105    CO2 23 23  GLUCOSE 130* 147*  BUN 23* 29*  CREATININE 0.97 1.25*  CALCIUM 9.0 8.8*  MG  --  1.8   Liver Function Tests Recent Labs    09/03/17 0420 09/04/17 0448  AST 24 20  ALT 50 42  ALKPHOS 85 79  BILITOT 0.9 1.3*  PROT 6.9 7.0  ALBUMIN 2.8* 2.9*   No results for input(s): LIPASE, AMYLASE in the last 72 hours. Cardiac Enzymes No results for input(s): CKTOTAL, CKMB, CKMBINDEX, TROPONINI in the last 72 hours.  BNP: BNP (last 3 results) Recent Labs    12/07/16 1838 01/28/17 1412 08/25/17 1337  BNP 1,321.0* 2,506.0* 2,510.0*    ProBNP (last 3 results) No results for input(s): PROBNP in the last 8760 hours.   D-Dimer No results for input(s): DDIMER in the last 72 hours. Hemoglobin A1C No results for input(s): HGBA1C in the last 72 hours. Fasting Lipid Panel No results for input(s): CHOL, HDL, LDLCALC, TRIG, CHOLHDL, LDLDIRECT in the last 72 hours. Thyroid Function Tests No results for input(s): TSH, T4TOTAL, T3FREE, THYROIDAB in the last 72 hours.  Invalid input(s): FREET3  Other results:   Imaging    Dg Chest Port 1 View  Result Date: 09/04/2017 CLINICAL DATA:  Shortness of breath.  Smoker. EXAM: PORTABLE CHEST 1 VIEW COMPARISON:  08/30/2017. FINDINGS: Stable  enlarged cardiac silhouette. The right jugular Swan-Ganz catheter has been removed. Right PICC tip in the inferior aspect of the superior vena cava. Clear lungs with normal vascularity. The lungs are mildly hyperexpanded with mild central peribronchial thickening and accentuation of the interstitial markings. Unremarkable bones. IMPRESSION: 1. Stable cardiomegaly. 2. Stable changes of COPD and chronic bronchitis. 3. Right PICC tip in the superior vena cava. This could be advanced 3 cm to place it at the superior cavoatrial junction if desired. Electronically Signed   By: Beckie Salts M.D.   On: 09/04/2017 07:27     Medications:     Scheduled Medications: . allopurinol  150 mg Oral Daily  .  aspirin  81 mg Oral Pre-Cath  . aspirin EC  81 mg Oral Daily  . benzonatate  100 mg Oral TID  . Chlorhexidine Gluconate Cloth  6 each Topical Daily  . digoxin  0.125 mg Oral Daily  . enoxaparin (LOVENOX) injection  40 mg Subcutaneous Q24H  . feeding supplement (ENSURE ENLIVE)  237 mL Oral TID WC  . guaiFENesin  600 mg Oral BID  . Living Better with Heart Failure Book   Does not apply Once  . polyethylene glycol  17 g Oral Daily  . potassium chloride  10 mEq Oral BID  . sertraline  25 mg Oral Daily  . sodium chloride flush  10-40 mL Intracatheter Q12H  . sodium chloride flush  3 mL Intravenous Q12H  . sodium chloride flush  3 mL Intravenous Q12H  . spironolactone  12.5 mg Oral Daily  . traZODone  50 mg Oral QHS    Infusions: . sodium chloride 10 mL/hr at 09/03/17 2000  . sodium chloride    . sodium chloride    . magnesium sulfate 1 - 4 g bolus IVPB    . milrinone 0.25 mcg/kg/min (09/03/17 2000)  . norepinephrine (LEVOPHED) Adult infusion 3.013 mcg/min (09/03/17 2000)    PRN Medications: sodium chloride, sodium chloride, acetaminophen, albuterol, ALPRAZolam, alum & mag hydroxide-simeth, guaiFENesin-dextromethorphan, methocarbamol, nitroGLYCERIN, ondansetron (ZOFRAN) IV, simethicone, sodium chloride flush, sodium chloride flush, sodium chloride flush, traMADol    Patient Profile   Jonathan Wood is 47 year old with a history of NICM, HTN, ETOH abuse, smoker, asthma, and chronic systolic heart failure.   Assessment/Plan   1. Cardiogenic Shock- NICM had LHC 2018 with normal cors.  - ECHO this admit EF 5-10%. RV moderately reduced.   - Continues to be very tenuous despite milrinone and NE. CVP up to 18-19. Co-ox low - Increase milrinone to 0.375. Continue NE -> titrate as needed - Resume IV lasix  - Plan IABP tomorrow - Continue digoxin and spiro  - Smoker- Not a candidate for transplant. Needs 6 months off nicotine.  -  VAD coordinators, HFSW, Palliative Care consulted and  following along.  - Working on Therapist, occupational.   - Blood Type B+  - Palliative Care appreciated.   - Dr. Donata Clay has seen. Plan for tentative VAD on Tuesday. Will place pre-op IABP tomorrow.   2. Shock Liver - resolved with hemodynamic support   3. AKI II - resolved with hemodynamic support   4.  Tobacco Abuse  Counseled to stop smoking   5. ETOH  Counseled to stop drinking all together.   6. Gout Uric Acid 14.5 treated with 3 days of steroids. Now off. Now on allopurinol  7. CAP -Continue doxycyline.   8. Anxiety Started xanax and sertraline on 4/17 .    CRITICAL CARE  Performed by: Arvilla Meres  Total critical care time: 35 minutes  Critical care time was exclusive of separately billable procedures and treating other patients.  Critical care was necessary to treat or prevent imminent or life-threatening deterioration.  Critical care was time spent personally by me (independent of midlevel providers or residents) on the following activities: development of treatment plan with patient and/or surrogate as well as nursing, discussions with consultants, evaluation of patient's response to treatment, examination of patient, obtaining history from patient or surrogate, ordering and performing treatments and interventions, ordering and review of laboratory studies, ordering and review of radiographic studies, pulse oximetry and re-evaluation of patient's condition.    Length of Stay: 10  Arvilla Meres, MD  09/04/2017, 10:55 AM  Advanced Heart Failure Team Pager [phone] (M-F; 7a - 4p)  Please contact [facility] for night-coverage after hours (4p -7a ) and weekends on [email or web address]

## 2017-09-05 ENCOUNTER — Encounter (HOSPITAL_COMMUNITY)
Admission: EM | Disposition: A | Discharge status: Home / Self Care | Payer: Self-pay | Source: Home / Self Care | Attending: Internal Medicine

## 2017-09-05 ENCOUNTER — Encounter (HOSPITAL_COMMUNITY): Payer: Self-pay | Admitting: Certified Registered Nurse Anesthetist

## 2017-09-05 HISTORY — PX: IABP INSERTION: CATH118242

## 2017-09-05 LAB — CBC
HCT: 34.7 % — ABNORMAL LOW (ref 39.0–52.0)
HEMOGLOBIN: 11.2 g/dL — AB (ref 13.0–17.0)
MCH: 28.4 pg (ref 26.0–34.0)
MCHC: 32.3 g/dL (ref 30.0–36.0)
MCV: 87.8 fL (ref 78.0–100.0)
Platelets: 221 10*3/uL (ref 150–400)
RBC: 3.95 MIL/uL — AB (ref 4.22–5.81)
RDW: 13.4 % (ref 11.5–15.5)
WBC: 9.3 10*3/uL (ref 4.0–10.5)

## 2017-09-05 LAB — COMPREHENSIVE METABOLIC PANEL
ALK PHOS: 82 U/L (ref 38–126)
ALT: 39 U/L (ref 17–63)
AST: 22 U/L (ref 15–41)
Albumin: 2.9 g/dL — ABNORMAL LOW (ref 3.5–5.0)
Anion gap: 8 (ref 5–15)
BUN: 27 mg/dL — ABNORMAL HIGH (ref 6–20)
CO2: 22 mmol/L (ref 22–32)
CREATININE: 1.06 mg/dL (ref 0.61–1.24)
Calcium: 9 mg/dL (ref 8.9–10.3)
Chloride: 103 mmol/L (ref 101–111)
Glucose, Bld: 108 mg/dL — ABNORMAL HIGH (ref 65–99)
Potassium: 4.1 mmol/L (ref 3.5–5.1)
Sodium: 133 mmol/L — ABNORMAL LOW (ref 135–145)
Total Bilirubin: 0.9 mg/dL (ref 0.3–1.2)
Total Protein: 6.9 g/dL (ref 6.5–8.1)

## 2017-09-05 LAB — COOXEMETRY PANEL
Carboxyhemoglobin: 1.3 % (ref 0.5–1.5)
Methemoglobin: 1.1 % (ref 0.0–1.5)
O2 Saturation: 60.1 %
Total hemoglobin: 20.1 g/dL — ABNORMAL HIGH (ref 12.0–16.0)

## 2017-09-05 LAB — MAGNESIUM: MAGNESIUM: 2.2 mg/dL (ref 1.7–2.4)

## 2017-09-05 LAB — HEPARIN LEVEL (UNFRACTIONATED): Heparin Unfractionated: <0.1 IU/mL — ABNORMAL LOW (ref 0.30–0.70)

## 2017-09-05 LAB — DIGOXIN LEVEL

## 2017-09-05 LAB — FACTOR 5 LEIDEN

## 2017-09-05 SURGERY — IABP INSERTION
Anesthesia: LOCAL

## 2017-09-05 MED ORDER — SODIUM CHLORIDE 0.9 % IV SOLN
0.0000 ug/min | INTRAVENOUS | Status: DC
  Filled 2017-09-05: qty 2

## 2017-09-05 MED ORDER — SODIUM CHLORIDE 0.9 % IV SOLN
INTRAVENOUS | Status: DC
  Filled 2017-09-05: qty 30

## 2017-09-05 MED ORDER — TRANEXAMIC ACID (OHS) BOLUS VIA INFUSION
15.0000 mg/kg | INTRAVENOUS | Status: DC
  Filled 2017-09-05: qty 1289

## 2017-09-05 MED ORDER — FENTANYL CITRATE (PF) 100 MCG/2ML IJ SOLN
INTRAMUSCULAR | Status: AC
  Filled 2017-09-05: qty 2

## 2017-09-05 MED ORDER — BISACODYL 5 MG PO TBEC: 5.0000 mg | DELAYED_RELEASE_TABLET | Freq: Once | ORAL | Status: DC

## 2017-09-05 MED ORDER — SODIUM CHLORIDE 0.9 % IV SOLN
750.0000 mg | INTRAVENOUS | Status: DC
  Filled 2017-09-05: qty 750

## 2017-09-05 MED ORDER — CHLORHEXIDINE GLUCONATE 4 % EX LIQD: 60.0000 mL | Freq: Once | CUTANEOUS | Status: DC

## 2017-09-05 MED ORDER — TRANEXAMIC ACID (OHS) BOLUS VIA INFUSION
15.0000 mg/kg | INTRAVENOUS | Status: AC
  Administered 2017-09-06: 1288.5 mg via INTRAVENOUS

## 2017-09-05 MED ORDER — SODIUM CHLORIDE 0.9 % IV SOLN
1.5000 g | INTRAVENOUS | Status: DC
  Filled 2017-09-05: qty 1.5

## 2017-09-05 MED ORDER — SODIUM CHLORIDE 0.9 % IV SOLN: 250.0000 mL | INTRAVENOUS | Status: DC | PRN

## 2017-09-05 MED ORDER — LIDOCAINE HCL (PF) 1 % IJ SOLN
INTRAMUSCULAR | Status: AC
  Filled 2017-09-05: qty 30

## 2017-09-05 MED ORDER — IOPAMIDOL (ISOVUE-370) INJECTION 76%
INTRAVENOUS | Status: DC | PRN
  Administered 2017-09-05: 5 mL via INTRA_ARTERIAL

## 2017-09-05 MED ORDER — DOPAMINE-DEXTROSE 3.2-5 MG/ML-% IV SOLN
0.0000 ug/kg/min | INTRAVENOUS | Status: DC
  Filled 2017-09-05: qty 250

## 2017-09-05 MED ORDER — ONDANSETRON HCL 4 MG/2ML IJ SOLN: 4.0000 mg | Freq: Four times a day (QID) | INTRAMUSCULAR | Status: DC | PRN

## 2017-09-05 MED ORDER — POTASSIUM CHLORIDE 2 MEQ/ML IV SOLN
80.0000 meq | INTRAVENOUS | Status: DC
  Filled 2017-09-05: qty 40

## 2017-09-05 MED ORDER — TEMAZEPAM 15 MG PO CAPS: 15.0000 mg | ORAL_CAPSULE | Freq: Once | ORAL | Status: DC | PRN

## 2017-09-05 MED ORDER — DOBUTAMINE IN D5W 4-5 MG/ML-% IV SOLN
2.0000 ug/kg/min | INTRAVENOUS | Status: DC
  Filled 2017-09-05: qty 250

## 2017-09-05 MED ORDER — OXYCODONE HCL 5 MG PO TABS
5.0000 mg | ORAL_TABLET | ORAL | Status: DC | PRN
  Administered 2017-09-05 (×2): 5 mg via ORAL
  Filled 2017-09-05 (×2): qty 1

## 2017-09-05 MED ORDER — FENTANYL CITRATE (PF) 100 MCG/2ML IJ SOLN
INTRAMUSCULAR | Status: DC | PRN
  Administered 2017-09-05 (×2): 25 ug via INTRAVENOUS

## 2017-09-05 MED ORDER — NITROGLYCERIN IN D5W 200-5 MCG/ML-% IV SOLN
0.0000 ug/min | INTRAVENOUS | Status: DC
  Filled 2017-09-05: qty 250

## 2017-09-05 MED ORDER — VASOPRESSIN 20 UNIT/ML IV SOLN
0.0400 [IU]/min | INTRAVENOUS | Status: DC
  Filled 2017-09-05: qty 2

## 2017-09-05 MED ORDER — SODIUM CHLORIDE 0.9 % IV SOLN
1.5000 mg/kg/h | INTRAVENOUS | Status: AC
  Administered 2017-09-06: 1.5 mg/kg/h via INTRAVENOUS
  Filled 2017-09-05: qty 25

## 2017-09-05 MED ORDER — MAGNESIUM SULFATE 50 % IJ SOLN
40.0000 meq | INTRAMUSCULAR | Status: DC
  Filled 2017-09-05: qty 9.85

## 2017-09-05 MED ORDER — SODIUM CHLORIDE 0.9 % IV SOLN
INTRAVENOUS | Status: DC
  Filled 2017-09-05: qty 1

## 2017-09-05 MED ORDER — TRANEXAMIC ACID (OHS) PUMP PRIME SOLUTION
2.0000 mg/kg | INTRAVENOUS | Status: DC
  Filled 2017-09-05: qty 1.72

## 2017-09-05 MED ORDER — HEPARIN (PORCINE) IN NACL 100-0.45 UNIT/ML-% IJ SOLN
950.0000 [IU]/h | INTRAMUSCULAR | Status: DC
  Administered 2017-09-05: 850 [IU]/h via INTRAVENOUS
  Filled 2017-09-05: qty 250

## 2017-09-05 MED ORDER — DIAZEPAM 2 MG PO TABS: 2.0000 mg | ORAL_TABLET | Freq: Once | ORAL | Status: DC

## 2017-09-05 MED ORDER — HEPARIN SODIUM (PORCINE) 1000 UNIT/ML IJ SOLN
INTRAMUSCULAR | Status: AC
  Filled 2017-09-05: qty 1

## 2017-09-05 MED ORDER — MILRINONE LACTATE IN DEXTROSE 20-5 MG/100ML-% IV SOLN
0.3000 ug/kg/min | INTRAVENOUS | Status: DC
  Administered 2017-09-06: 0.375 ug/kg/min via INTRAVENOUS
  Filled 2017-09-05 (×2): qty 100

## 2017-09-05 MED ORDER — IOPAMIDOL (ISOVUE-370) INJECTION 76%
INTRAVENOUS | Status: AC
  Filled 2017-09-05: qty 50

## 2017-09-05 MED ORDER — CHLORHEXIDINE GLUCONATE CLOTH 2 % EX PADS
6.0000 | MEDICATED_PAD | Freq: Once | CUTANEOUS | Status: AC
  Administered 2017-09-05: 6 via TOPICAL

## 2017-09-05 MED ORDER — HEPARIN (PORCINE) IN NACL 1000-0.9 UT/500ML-% IV SOLN
INTRAVENOUS | Status: AC
  Filled 2017-09-05: qty 500

## 2017-09-05 MED ORDER — SODIUM CHLORIDE 0.9 % IV SOLN
600.0000 mg | INTRAVENOUS | Status: AC
  Administered 2017-09-06: 600 mg via INTRAVENOUS
  Filled 2017-09-05: qty 600

## 2017-09-05 MED ORDER — MUPIROCIN 2 % EX OINT: 1.0000 "application " | TOPICAL_OINTMENT | Freq: Two times a day (BID) | CUTANEOUS | Status: DC

## 2017-09-05 MED ORDER — DEXMEDETOMIDINE HCL IN NACL 400 MCG/100ML IV SOLN
0.1000 ug/kg/h | INTRAVENOUS | Status: AC
  Administered 2017-09-06: .3 ug/kg/h via INTRAVENOUS
  Filled 2017-09-05: qty 100

## 2017-09-05 MED ORDER — VANCOMYCIN HCL 10 G IV SOLR
1500.0000 mg | INTRAVENOUS | Status: DC
  Filled 2017-09-05: qty 1500

## 2017-09-05 MED ORDER — LIDOCAINE HCL (PF) 1 % IJ SOLN
INTRAMUSCULAR | Status: DC | PRN
  Administered 2017-09-05: 15 mL

## 2017-09-05 MED ORDER — EPINEPHRINE PF 1 MG/ML IJ SOLN
0.0000 ug/min | INTRAVENOUS | Status: DC
  Filled 2017-09-05: qty 4

## 2017-09-05 MED ORDER — MIDAZOLAM HCL 2 MG/2ML IJ SOLN
INTRAMUSCULAR | Status: DC | PRN
  Administered 2017-09-05: 2 mg via INTRAVENOUS

## 2017-09-05 MED ORDER — DEXTROSE 5 % IV SOLN
0.0000 ug/min | INTRAVENOUS | Status: AC
  Administered 2017-09-06: 1 ug/min via INTRAVENOUS
  Filled 2017-09-05: qty 4

## 2017-09-05 MED ORDER — CHLORHEXIDINE GLUCONATE 0.12 % MT SOLN: 15.0000 mL | Freq: Once | OROMUCOSAL | Status: DC

## 2017-09-05 MED ORDER — VANCOMYCIN HCL 1 G IV SOLR
1000.0000 mg | INTRAVENOUS | Status: DC
  Filled 2017-09-05: qty 1000

## 2017-09-05 MED ORDER — HEPARIN SODIUM (PORCINE) 1000 UNIT/ML IJ SOLN
INTRAMUSCULAR | Status: DC | PRN
  Administered 2017-09-05: 2500 [IU] via INTRAVENOUS

## 2017-09-05 MED ORDER — ACETAMINOPHEN 325 MG PO TABS
650.0000 mg | ORAL_TABLET | ORAL | Status: DC | PRN
  Filled 2017-09-05: qty 2

## 2017-09-05 MED ORDER — SODIUM CHLORIDE 0.9 % IV SOLN
INTRAVENOUS | Status: AC
  Administered 2017-09-06: 1 [IU]/h via INTRAVENOUS
  Filled 2017-09-05: qty 1

## 2017-09-05 MED ORDER — NOREPINEPHRINE BITARTRATE 1 MG/ML IV SOLN: 0.0000 ug/min | INTRAVENOUS | Status: DC

## 2017-09-05 MED ORDER — SODIUM CHLORIDE 0.9 % IV SOLN
1.5000 g | INTRAVENOUS | Status: AC
  Administered 2017-09-06: 1.5 g via INTRAVENOUS
  Filled 2017-09-05: qty 1.5

## 2017-09-05 MED ORDER — SODIUM CHLORIDE 0.9 % IV SOLN
600.0000 mg | INTRAVENOUS | Status: DC
  Filled 2017-09-05: qty 600

## 2017-09-05 MED ORDER — MILRINONE LACTATE IN DEXTROSE 20-5 MG/100ML-% IV SOLN
0.3000 ug/kg/min | INTRAVENOUS | Status: DC
  Filled 2017-09-05: qty 100

## 2017-09-05 MED ORDER — DEXMEDETOMIDINE HCL IN NACL 400 MCG/100ML IV SOLN
0.1000 ug/kg/h | INTRAVENOUS | Status: DC
  Filled 2017-09-05: qty 100

## 2017-09-05 MED ORDER — SODIUM CHLORIDE 0.9% FLUSH: 3.0000 mL | Freq: Two times a day (BID) | INTRAVENOUS | Status: DC

## 2017-09-05 MED ORDER — VANCOMYCIN HCL 1 G IV SOLR
1000.0000 mg | INTRAVENOUS | Status: AC
  Administered 2017-09-06: 2 g
  Filled 2017-09-05: qty 1000

## 2017-09-05 MED ORDER — CHLORHEXIDINE GLUCONATE CLOTH 2 % EX PADS
6.0000 | MEDICATED_PAD | Freq: Once | CUTANEOUS | Status: AC
  Administered 2017-09-06: 6 via TOPICAL

## 2017-09-05 MED ORDER — SODIUM CHLORIDE 0.9% FLUSH: 3.0000 mL | INTRAVENOUS | Status: DC | PRN

## 2017-09-05 MED ORDER — TRANEXAMIC ACID 1000 MG/10ML IV SOLN
1.5000 mg/kg/h | INTRAVENOUS | Status: DC
  Filled 2017-09-05: qty 25

## 2017-09-05 MED ORDER — DEXTROSE 5 % IV SOLN
0.0000 ug/min | INTRAVENOUS | Status: DC
  Filled 2017-09-05: qty 4

## 2017-09-05 MED ORDER — VANCOMYCIN HCL 10 G IV SOLR
1500.0000 mg | INTRAVENOUS | Status: AC
  Administered 2017-09-06: 1500 mg via INTRAVENOUS
  Filled 2017-09-05: qty 1500

## 2017-09-05 MED ORDER — HEPARIN (PORCINE) IN NACL 2-0.9 UNITS/ML
INTRAMUSCULAR | Status: AC | PRN
  Administered 2017-09-05: 500 mL

## 2017-09-05 MED ORDER — FLUCONAZOLE IN SODIUM CHLORIDE 400-0.9 MG/200ML-% IV SOLN
400.0000 mg | INTRAVENOUS | Status: DC
  Filled 2017-09-05: qty 200

## 2017-09-05 MED ORDER — MIDAZOLAM HCL 2 MG/2ML IJ SOLN
INTRAMUSCULAR | Status: AC
  Filled 2017-09-05: qty 2

## 2017-09-05 MED ORDER — FLUCONAZOLE IN SODIUM CHLORIDE 400-0.9 MG/200ML-% IV SOLN
400.0000 mg | INTRAVENOUS | Status: AC
  Administered 2017-09-06: 400 mg via INTRAVENOUS
  Filled 2017-09-05: qty 200

## 2017-09-05 SURGICAL SUPPLY — 9 items
BALLN IABP SENSA PLUS 8F 50CC (BALLOONS) ×2
BALLOON IABP SENS PLUS 8F 50CC (BALLOONS) IMPLANT
DEVICE SECURE STATLOCK IABP (MISCELLANEOUS) ×2 IMPLANT
PACK CARDIAC CATHETERIZATION (CUSTOM PROCEDURE TRAY) ×2 IMPLANT
PROTECTION STATION PRESSURIZED (MISCELLANEOUS) ×2
SHEATH PINNACLE 5F 10CM (SHEATH) ×1 IMPLANT
STATION PROTECTION PRESSURIZED (MISCELLANEOUS) IMPLANT
TRANSDUCER W/STOPCOCK (MISCELLANEOUS) ×2 IMPLANT
WIRE EMERALD 3MM-J .035X150CM (WIRE) ×1 IMPLANT

## 2017-09-05 NOTE — Progress Notes (Signed)
Palliative:  I met today with Jonathan Wood and surprisingly no family currently at bedside. Just had IABP placed and he says this was "not as big a deal" as he thought. He says that he is okay to lie in bed today as he is tired. Did not sleep much last night and had many visitors from family and friends this weekend. He is prepared for LVAD tomorrow and shares that he is not scared anymore but just ready to have it done and get to his "second life." He shares that his children are happy for this too.   He has not yet had Living Will/HCPOA notarized and Erin, RN will follow up to ensure this is done today. Jonathan Wood understands the importance of having this complete prior to surgery given the fact he is still legally married (updated Jonathan Wood). He seems in good spirits today and seems mentally and emotionally prepared for LVAD placement. Emotional support provided.   15 min  [name], NP Palliative Medicine Team Pager # [phone] (M-F 8a-5p) Team Phone # [phone] (Nights/Weekends)

## 2017-09-05 NOTE — Interval H&P Note (Signed)
History and Physical Interval Note:  09/05/2017 9:48 AM  Jonathan Wood  has presented today for surgery, with the diagnosis of HF  The various methods of treatment have been discussed with the patient and family. After consideration of risks, benefits and other options for treatment, the patient has consented to  Procedure(s): IABP INSERTION (N/A) as a surgical intervention .  The patient's history has been reviewed, patient examined, no change in status, stable for surgery.  I have reviewed the patient's chart and labs.  Questions were answered to the patient's satisfaction.     [name]

## 2017-09-05 NOTE — Progress Notes (Signed)
ANTICOAGULATION CONSULT NOTE - Initial Consult  Pharmacy Consult for heparin Indication: IABP  No Known Allergies  Patient Measurements: Height: 6\' 2"  (188 cm) Weight: 189 lb 4.8 oz (85.9 kg) IBW/kg (Calculated) : 82.2 Heparin Dosing Weight: 85.9 kg  Vital Signs: Temp: 97.7 F (36.5 C) (04/22 0845) Temp Source: Oral (04/22 0845) BP: 123/62 (04/22 1009) Pulse Rate: 80 (04/22 1009)  Labs: Recent Labs    09/03/17 0420 09/04/17 0448 09/05/17 0359  HGB 11.1* 11.1* 11.2*  HCT 34.5* 34.6* 34.7*  PLT 214 205 221  CREATININE 0.97 1.25* 1.06    Estimated Creatinine Clearance: 103.4 mL/min (by C-G formula based on SCr of 1.06 mg/dL).   Medical History: Past Medical History:  Diagnosis Date  . Asthma   . CHF (congestive heart failure) (HCC)    a. 09/2016: EF 20-25% with cath showing normal cors  . GERD (gastroesophageal reflux disease)   . History of hiatal hernia     Medications:  Scheduled:  . allopurinol  150 mg Oral Daily  . aspirin EC  81 mg Oral Daily  . benzonatate  100 mg Oral TID  . Chlorhexidine Gluconate Cloth  6 each Topical Daily  . digoxin  0.125 mg Oral Daily  . feeding supplement (ENSURE ENLIVE)  237 mL Oral TID WC  . furosemide  80 mg Intravenous BID  . guaiFENesin  600 mg Oral BID  . Living Better with Heart Failure Book   Does not apply Once  . polyethylene glycol  17 g Oral Daily  . sertraline  25 mg Oral Daily  . sodium chloride flush  10-40 mL Intracatheter Q12H  . sodium chloride flush  3 mL Intravenous Q12H  . spironolactone  12.5 mg Oral Daily  . traZODone  50 mg Oral QHS    Assessment: 46 yom who came in with ECHO showing EF 5-10% started on milrinone and NE, on IV lasix. Underwent IABP placement today for low co-ox (52.7%) despite inotropes. Plan for LVAD tomorrow.  Was on enoxaparin (last dose on 4/21 at 1615). Okay per Dr [name] to start heparin for IABP now. No signs/symptoms of bleeding. Hgb 11.2, plts 221.   Goal of Therapy:   Heparin level 0.2-0.5 units/ml Monitor platelets by anticoagulation protocol: Yes   Plan:  Start heparin infusion at 850 units/hr Check anti-Xa level in 6 hours and daily while on heparin Continue to monitor H&H and platelets  Girard Cooter, PharmD Clinical Pharmacist  Pager: [phone] Clinical Phone for 09/05/2017 until 3:30pm: [phone] If after 3:30pm, please call main pharmacy at [phone] 09/05/2017,10:39 AM

## 2017-09-05 NOTE — Progress Notes (Signed)
Day of Surgery Procedure(s) (LRB): IABP INSERTION (N/A) Subjective: Stable and balloon pump in preparation for LVAD implantation CVP 8 Lungs clear Neuro intact  Objective: Vital signs in last 24 hours: Temp:  [97.5 F (36.4 C)-98.1 F (36.7 C)] 97.5 F (36.4 C) (04/22 1538) Pulse Rate:  [75-109] 80 (04/22 1009) Cardiac Rhythm: Normal sinus rhythm (04/22 0400) Resp:  [4-93] 23 (04/22 1330) BP: (83-123)/(47-91) 97/47 (04/22 1500) SpO2:  [94 %-100 %] 98 % (04/22 1330) Arterial Line BP: (93-110)/(49-65) 94/52 (04/22 1330) Weight:  [189 lb 4.8 oz (85.9 kg)] 189 lb 4.8 oz (85.9 kg) (04/22 0400)  Hemodynamic parameters for last 24 hours: Sinus rhythm Intake/Output from previous day: 04/21 0701 - 04/22 0700 In: 1857.2 [P.O.:980; I.V.:877.2] Out: 3250 [Urine:3250] Intake/Output this shift: No intake/output data recorded.       Exam    General- alert and comfortable    Neck- no JVD, no cervical adenopathy palpable, no carotid bruit   Lungs- clear without rales, wheezes   Cor- regular rate and rhythm, no murmur , gallop   Abdomen- soft, non-tender   Extremities - warm, non-tender, minimal edema   Neuro- oriented, appropriate, no focal weakness   Lab Results: Recent Labs    09/04/17 0448 09/05/17 0359  WBC 10.1 9.3  HGB 11.1* 11.2*  HCT 34.6* 34.7*  PLT 205 221   BMET:  Recent Labs    09/04/17 0448 09/05/17 0359  NA 135 133*  K 4.5 4.1  CL 105 103  CO2 23 22  GLUCOSE 147* 108*  BUN 29* 27*  CREATININE 1.25* 1.06  CALCIUM 8.8* 9.0    PT/INR: No results for input(s): LABPROT, INR in the last 72 hours. ABG    Component Value Date/Time   PHART 7.398 10/08/2016 0824   HCO3 24.6 08/30/2017 1428   HCO3 24.5 08/30/2017 1428   TCO2 26 08/30/2017 1428   TCO2 26 08/30/2017 1428   ACIDBASEDEF 1.0 08/30/2017 1428   ACIDBASEDEF 1.0 08/30/2017 1428   O2SAT 60.1 09/05/2017 1041   CBG (last 3)  No results for input(s): GLUCAP in the last 72  hours.  Assessment/Plan: S/P Procedure(s) (LRB): IABP INSERTION (N/A) I have discussed the procedure of HeartMate 3 implantation with the patient including the indications benefits alternatives and risks.  Patient understands that he is not currently a candidate for transplantation because he is not on transplant waiting list.  He understands the LVAD is being implanted to improve his survival and quality of life.  He understands the risk of bleeding, RV dysfunction requiring RVAD, infection, organ failure, and death.  He agrees to proceed with surgery tomorrow   LOS: 11 days    Kathlee Nations Trigt III 09/05/2017

## 2017-09-05 NOTE — Progress Notes (Signed)
Ref: Patient, No Pcp Per   Subjective:  Had IABP in Right FA placed today. Afebrile.  Objective:  Vital Signs in the last 24 hours: Temp:  [97.5 F (36.4 C)-98.1 F (36.7 C)] 97.5 F (36.4 C) (04/22 1538) Pulse Rate:  [75-109] 80 (04/22 1009) Cardiac Rhythm: Normal sinus rhythm (04/22 0400) Resp:  [4-93] 23 (04/22 1330) BP: (83-123)/(50-91) (P) 97/47 (04/22 1500) SpO2:  [94 %-100 %] 98 % (04/22 1330) Arterial Line BP: (93-110)/(49-65) 94/52 (04/22 1330) Weight:  [85.9 kg (189 lb 4.8 oz)] 85.9 kg (189 lb 4.8 oz) (04/22 0400)  Physical Exam: BP Readings from Last 1 Encounters:  09/05/17 (!) (P) 97/47     Wt Readings from Last 1 Encounters:  09/05/17 85.9 kg (189 lb 4.8 oz)    Weight change: -0.635 kg (-1 lb 6.4 oz) Body mass index is 24.3 kg/m. HEENT: [place]/AT, Eyes-Brown, PERL, EOMI, Conjunctiva-Pink, Sclera-Non-icteric Neck: No JVD, No bruit, Trachea midline. Lungs:  Clear, Bilateral. Cardiac:  Regular rhythm, normal S1 and S2, no S3. II/VI systolic murmur. Abdomen:  Soft, non-tender. BS present. Extremities:  No edema present. No cyanosis. No clubbing. CNS: AxOx3, Cranial nerves grossly intact, moves all 4 extremities.  Skin: Warm and dry.   Intake/Output from previous day: 04/21 0701 - 04/22 0700 In: 1857.2 [P.O.:980; I.V.:877.2] Out: 3250 [Urine:3250]    Lab Results: BMET    Component Value Date/Time   NA 133 (L) 09/05/2017 0359   NA 135 09/04/2017 0448   NA 136 09/03/2017 0420   K 4.1 09/05/2017 0359   K 4.5 09/04/2017 0448   K 4.3 09/03/2017 0420   CL 103 09/05/2017 0359   CL 105 09/04/2017 0448   CL 106 09/03/2017 0420   CO2 22 09/05/2017 0359   CO2 23 09/04/2017 0448   CO2 23 09/03/2017 0420   GLUCOSE 108 (H) 09/05/2017 0359   GLUCOSE 147 (H) 09/04/2017 0448   GLUCOSE 130 (H) 09/03/2017 0420   BUN 27 (H) 09/05/2017 0359   BUN 29 (H) 09/04/2017 0448   BUN 23 (H) 09/03/2017 0420   CREATININE 1.06 09/05/2017 0359   CREATININE 1.25 (H) 09/04/2017  0448   CREATININE 0.97 09/03/2017 0420   CALCIUM 9.0 09/05/2017 0359   CALCIUM 8.8 (L) 09/04/2017 0448   CALCIUM 9.0 09/03/2017 0420   GFRNONAA >60 09/05/2017 0359   GFRNONAA >60 09/04/2017 0448   GFRNONAA >60 09/03/2017 0420   GFRAA >60 09/05/2017 0359   GFRAA >60 09/04/2017 0448   GFRAA >60 09/03/2017 0420   CBC    Component Value Date/Time   WBC 9.3 09/05/2017 0359   RBC 3.95 (L) 09/05/2017 0359   HGB 11.2 (L) 09/05/2017 0359   HCT 34.7 (L) 09/05/2017 0359   PLT 221 09/05/2017 0359   MCV 87.8 09/05/2017 0359   MCH 28.4 09/05/2017 0359   MCHC 32.3 09/05/2017 0359   RDW 13.4 09/05/2017 0359   LYMPHSABS 5.6 (H) 10/08/2016 0332   MONOABS 0.9 10/08/2016 0332   EOSABS 0.3 10/08/2016 0332   BASOSABS 0.0 10/08/2016 0332   HEPATIC Function Panel Recent Labs    09/03/17 0420 09/04/17 0448 09/05/17 0359  PROT 6.9 7.0 6.9   HEMOGLOBIN A1C No components found for: HGA1C,  MPG CARDIAC ENZYMES Lab Results  Component Value Date   TROPONINI 0.04 (HH) 08/28/2017   TROPONINI 0.05 (HH) 08/26/2017   TROPONINI 0.05 (HH) 08/26/2017   BNP No results for input(s): PROBNP in the last 8760 hours. TSH Recent Labs    08/30/17 1237  TSH 1.957   CHOLESTEROL Recent Labs    08/30/17 1237  CHOL 111    Scheduled Meds: . allopurinol  150 mg Oral Daily  . aspirin EC  81 mg Oral Daily  . benzonatate  100 mg Oral TID  . Chlorhexidine Gluconate Cloth  6 each Topical Daily  . digoxin  0.125 mg Oral Daily  . feeding supplement (ENSURE ENLIVE)  237 mL Oral TID WC  . furosemide  80 mg Intravenous BID  . guaiFENesin  600 mg Oral BID  . Living Better with Heart Failure Book   Does not apply Once  . [START ON 09/06/2017] magnesium sulfate  40 mEq Other To OR  . polyethylene glycol  17 g Oral Daily  . [START ON 09/06/2017] potassium chloride  80 mEq Other To OR  . sertraline  25 mg Oral Daily  . sodium chloride flush  10-40 mL Intracatheter Q12H  . sodium chloride flush  3 mL Intravenous  Q12H  . sodium chloride flush  3 mL Intravenous Q12H  . spironolactone  12.5 mg Oral Daily  . [START ON 09/06/2017] tranexamic acid  15 mg/kg Intravenous To OR  . [START ON 09/06/2017] tranexamic acid  2 mg/kg Intracatheter To OR  . traZODone  50 mg Oral QHS  . [START ON 09/06/2017] vancomycin  1,000 mg Other To OR   Continuous Infusions: . sodium chloride 10 mL/hr at 09/05/17 0800  . sodium chloride    . [START ON 09/06/2017] cefUROXime (ZINACEF)  IV    . [START ON 09/06/2017] cefUROXime (ZINACEF)  IV    . [START ON 09/06/2017] dexmedetomidine    . [START ON 09/06/2017] DOBUTamine    . [START ON 09/06/2017] DOPamine    . [START ON 09/06/2017] epinephrine    . [START ON 09/06/2017] fluconazole (DIFLUCAN) IV    . [START ON 09/06/2017] heparin 30,000 units/NS 1000 mL solution for CELLSAVER    . heparin 850 Units/hr (09/05/17 1057)  . [START ON 09/06/2017] insulin (NOVOLIN-R) infusion    . milrinone 0.375 mcg/kg/min (09/05/17 0800)  . [START ON 09/06/2017] milrinone    . [START ON 09/06/2017] nitroGLYCERIN    . norepinephrine (LEVOPHED) Adult infusion 3 mcg/min (09/05/17 1200)  . [START ON 09/06/2017] norepinephrine (LEVOPHED) Adult infusion    . [START ON 09/06/2017] phenylephrine 20mg /253mL NS (0.08mg /ml) infusion    . [START ON 09/06/2017] rifampin (RIFADIN) IVPB    . [START ON 09/06/2017] tranexamic acid (CYKLOKAPRON) infusion ([facility])    . [START ON 09/06/2017] vancomycin    . 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   LOS: 11 days    Orpah Cobb  MD  09/05/2017, 4:42 PM

## 2017-09-05 NOTE — Progress Notes (Addendum)
Advanced Heart Failure Rounding Note  PCP-Cardiologist: No primary care provider on file.   Subjective:    Milrinone 0.25 NE increased to 4 this am for low BP.  SBP now 90-100  CVP read as 14 overnight. Not reading this am with PICC line not pulling back.   Feeling OK this am. Main complaint is being tired and hungry.  Denies SOB. No lightheadedness or dizziness. Denies CP. SBP 90-100s.  Creatinine stable at 1.06. K 4.1.  Unable to draw back from PICC line this am. IV team called.   Objective:   Weight Range: 189 lb 4.8 oz (85.9 kg) Body mass index is 24.3 kg/m.   Vital Signs:   Temp:  [97.4 F (36.3 C)-98.1 F (36.7 C)] 98.1 F (36.7 C) (04/22 0350) Pulse Rate:  [91-97] 95 (04/22 0400) Resp:  [18-33] 29 (04/22 0600) BP: (79-115)/(50-87) 113/84 (04/22 0600) SpO2:  [93 %-100 %] 100 % (04/22 0600) Weight:  [189 lb 4.8 oz (85.9 kg)] 189 lb 4.8 oz (85.9 kg) (04/22 0400) Last BM Date: 09/04/17  Weight change: Filed Weights   09/03/17 0500 09/04/17 0500 09/05/17 0400  Weight: 190 lb 0.6 oz (86.2 kg) 190 lb 11.2 oz (86.5 kg) 189 lb 4.8 oz (85.9 kg)    Intake/Output:   Intake/Output Summary (Last 24 hours) at 09/05/2017 2706 Last data filed at 09/05/2017 0600 Gross per 24 hour  Intake 1857.15 ml  Output 3250 ml  Net -1392.85 ml      Physical Exam   General: NAD HEENT: Normal Neck: Supple. JVP to jaw. Carotids 2+ bilat; no bruits. No thyromegaly or nodule noted. Cor: PMI nondisplaced. RRR, +S3 Lungs: CTAB, normal effort. Abdomen: Soft, non-tender, non-distended, no HSM. No bruits or masses. +BS  Extremities: No cyanosis, clubbing, or rash. R and LLE no edema.  Neuro: Alert & orientedx3, cranial nerves grossly intact. moves all 4 extremities w/o difficulty. Affect pleasant   Telemetry   Sinus 90-100, personally reviewed.   EKG    No new tracings.    Labs    CBC Recent Labs    09/04/17 0448 09/05/17 0359  WBC 10.1 9.3  HGB 11.1* 11.2*  HCT  34.6* 34.7*  MCV 88.0 87.8  PLT 205 221   Basic Metabolic Panel Recent Labs    23/76/28 0448 09/05/17 0359  NA 135 133*  K 4.5 4.1  CL 105 103  CO2 23 22  GLUCOSE 147* 108*  BUN 29* 27*  CREATININE 1.25* 1.06  CALCIUM 8.8* 9.0  MG 1.8 2.2   Liver Function Tests Recent Labs    09/04/17 0448 09/05/17 0359  AST 20 22  ALT 42 39  ALKPHOS 79 82  BILITOT 1.3* 0.9  PROT 7.0 6.9  ALBUMIN 2.9* 2.9*   No results for input(s): LIPASE, AMYLASE in the last 72 hours. Cardiac Enzymes No results for input(s): CKTOTAL, CKMB, CKMBINDEX, TROPONINI in the last 72 hours.  BNP: BNP (last 3 results) Recent Labs    12/07/16 1838 01/28/17 1412 08/25/17 1337  BNP 1,321.0* 2,506.0* 2,510.0*    ProBNP (last 3 results) No results for input(s): PROBNP in the last 8760 hours.   D-Dimer No results for input(s): DDIMER in the last 72 hours. Hemoglobin A1C No results for input(s): HGBA1C in the last 72 hours. Fasting Lipid Panel No results for input(s): CHOL, HDL, LDLCALC, TRIG, CHOLHDL, LDLDIRECT in the last 72 hours. Thyroid Function Tests No results for input(s): TSH, T4TOTAL, T3FREE, THYROIDAB in the last 72 hours.  Invalid input(s): FREET3  Other results:   Imaging    No results found.   Medications:     Scheduled Medications: . allopurinol  150 mg Oral Daily  . aspirin EC  81 mg Oral Daily  . benzonatate  100 mg Oral TID  . Chlorhexidine Gluconate Cloth  6 each Topical Daily  . digoxin  0.125 mg Oral Daily  . enoxaparin (LOVENOX) injection  40 mg Subcutaneous Q24H  . feeding supplement (ENSURE ENLIVE)  237 mL Oral TID WC  . furosemide  80 mg Intravenous BID  . guaiFENesin  600 mg Oral BID  . Living Better with Heart Failure Book   Does not apply Once  . polyethylene glycol  17 g Oral Daily  . sertraline  25 mg Oral Daily  . sodium chloride flush  10-40 mL Intracatheter Q12H  . sodium chloride flush  3 mL Intravenous Q12H  . sodium chloride flush  3 mL  Intravenous Q12H  . sodium chloride flush  3 mL Intravenous Q12H  . spironolactone  12.5 mg Oral Daily  . traZODone  50 mg Oral QHS    Infusions: . sodium chloride 10 mL/hr at 09/04/17 2000  . sodium chloride    . sodium chloride    . sodium chloride    . sodium chloride    . milrinone 0.375 mcg/kg/min (09/04/17 2004)  . norepinephrine (LEVOPHED) Adult infusion 4 mcg/min (09/04/17 2000)    PRN Medications: sodium chloride, sodium chloride, sodium chloride, acetaminophen, albuterol, ALPRAZolam, alum & mag hydroxide-simeth, guaiFENesin-dextromethorphan, methocarbamol, nitroGLYCERIN, ondansetron (ZOFRAN) IV, simethicone, sodium chloride flush, sodium chloride flush, sodium chloride flush, sodium chloride flush, traMADol    Patient Profile   Jonathan Wood is 47 year old with a history of NICM, HTN, ETOH abuse, smoker, asthma, and chronic systolic heart failure.   Assessment/Plan   1. Cardiogenic Shock- NICM had LHC 2018 with normal cors.  - ECHO this admit EF 5-10%. RV moderately reduced.   - Continues to be very tenuous despite milrinone and NE. CVP up to 18-19. Co-ox low - Continue milrinone 0.375 mcg/kg/min.  - Continue NE @ 4 -> Titrate as needed.  - Continue IV lasix 80 mg BID - Plan IABP today.  - Continue digoxin and spiro  - Smoker- Not a candidate for transplant. Needs 6 months off nicotine.  - VAD coordinators, HFSW, Palliative Care consulted and following along.  - Working on Therapist, occupational.   - Blood Type B+  - Palliative Care appreciated.   - Dr. Donata Clay has seen. Plan for tentative VAD on 09/09/17. Tentatively plan pre-op IABP today.   2. Shock Liver - Resolved with hemodynamic support.   3. AKI II - Resolved with hemodynamic support.   4.  Tobacco Abuse  - Encouraged complete cessation.  - Needs 6 months off for transplant consideration.   5. ETOH  - Encouraged complete cessation.   6. Gout Uric Acid 14.5 treated with 3 days of steroids. Now  off. Now on allopurinol - Stable.   7. CAP - Finished course of doxycyline.   8. Anxiety - Continue xanax and sertraline on 4/17 .   Relatively optimized for pre-op. Will continue IV lasix today and plan IABP this am for further optimization of hemodynamics prior to LVAD implantation.   Length of Stay: [address]  Jonathan Wood  09/05/2017, 7:12 AM  Advanced Heart Failure Team Pager [phone] (M-F; 7a - 4p)  Please contact [facility] for night-coverage after hours (4p -7a )  and weekends on [email or web address]  Agree with above.   He remains quite tenuous on dual inotropes. CVP elevated. Co-ox pending. Very fatigued.   On exam JVP to jaw Cor reg tachy +s3 Ab soft NT ND Ext warm mild edema  Remains tenuous with low output on dual inotropes. Will place IABP today in anticipation of VAD tomorrow. Diurese. T-PA PICC. Watch closely for decompensation.   CRITICAL CARE Performed by: Arvilla Meres  Total critical care time: 35 minutes  Critical care time was exclusive of separately billable procedures and treating other patients.  Critical care was necessary to treat or prevent imminent or life-threatening deterioration.  Critical care was time spent personally by me (independent of midlevel providers or residents) on the following activities: development of treatment plan with patient and/or surrogate as well as nursing, discussions with consultants, evaluation of patient's response to treatment, examination of patient, obtaining history from patient or surrogate, ordering and performing treatments and interventions, ordering and review of laboratory studies, ordering and review of radiographic studies, pulse oximetry and re-evaluation of patient's condition.  Arvilla Meres, MD  10:23 AM

## 2017-09-05 NOTE — Progress Notes (Addendum)
Unable to get PICC line to draw back for AM labs. IV team consult placed and assessed. Lab called to come stick for labs. Unable to get co-ox at this time.

## 2017-09-05 NOTE — Progress Notes (Signed)
ANTICOAGULATION CONSULT NOTE - Follow Up Consult  Pharmacy Consult for heparin Indication: IABP  No Known Allergies  Patient Measurements: Height: 6\' 2"  (188 cm) Weight: 189 lb 4.8 oz (85.9 kg) IBW/kg (Calculated) : 82.2 Heparin Dosing Weight: 85.9 kg  Vital Signs: Temp: 97.5 F (36.4 C) (04/22 1538) Temp Source: Axillary (04/22 1538) BP: 97/47 (04/22 1500) Pulse Rate: 80 (04/22 1009)  Labs: Recent Labs    09/03/17 0420 09/04/17 0448 09/05/17 0359 09/05/17 1811  HGB 11.1* 11.1* 11.2*  --   HCT 34.5* 34.6* 34.7*  --   PLT 214 205 221  --   HEPARINUNFRC  --   --   --  <0.10*  CREATININE 0.97 1.25* 1.06  --     Estimated Creatinine Clearance: 103.4 mL/min (by C-G formula based on SCr of 1.06 mg/dL).   Medical History: Past Medical History:  Diagnosis Date  . Asthma   . CHF (congestive heart failure) (HCC)    a. 09/2016: EF 20-25% with cath showing normal cors  . GERD (gastroesophageal reflux disease)   . History of hiatal hernia     Medications:  Scheduled:  . allopurinol  150 mg Oral Daily  . aspirin EC  81 mg Oral Daily  . benzonatate  100 mg Oral TID  . Chlorhexidine Gluconate Cloth  6 each Topical Daily  . digoxin  0.125 mg Oral Daily  . feeding supplement (ENSURE ENLIVE)  237 mL Oral TID WC  . furosemide  80 mg Intravenous BID  . guaiFENesin  600 mg Oral BID  . Living Better with Heart Failure Book   Does not apply Once  . [START ON 09/06/2017] magnesium sulfate  40 mEq Other To OR  . polyethylene glycol  17 g Oral Daily  . [START ON 09/06/2017] potassium chloride  80 mEq Other To OR  . sertraline  25 mg Oral Daily  . sodium chloride flush  10-40 mL Intracatheter Q12H  . sodium chloride flush  3 mL Intravenous Q12H  . sodium chloride flush  3 mL Intravenous Q12H  . spironolactone  12.5 mg Oral Daily  . [START ON 09/06/2017] tranexamic acid  15 mg/kg Intravenous To OR  . [START ON 09/06/2017] tranexamic acid  2 mg/kg Intracatheter To OR  . traZODone   50 mg Oral QHS  . [START ON 09/06/2017] vancomycin  1,000 mg Other To OR    Assessment: 16 yom who came in with ECHO showing EF 5-10% started on milrinone and NE, on IV lasix. Underwent IABP placement today for low co-ox (52.7%) despite inotropes. Plan for LVAD tomorrow.  Was on enoxaparin (last dose on 4/21 at 1615). Okay per Dr [name] to start heparin for IABP now. No signs/symptoms of bleeding. Hgb 11.2, plts 221.   Heparin drip 800 uts/hr HL 0.1 < goal.    Goal of Therapy:  Heparin level 0.2-0.5 units/ml Monitor platelets by anticoagulation protocol: Yes   Plan:  Increase Heparin 950 uts/hr  No recheck as patient will be in OR for LVAD in am Turn heparin off on call    [name] Pharm.D. CPP, BCPS Clinical Pharmacist [phone] 09/05/2017 7:59 PM

## 2017-09-06 ENCOUNTER — Inpatient Hospital Stay (HOSPITAL_COMMUNITY): Payer: Medicaid Other

## 2017-09-06 ENCOUNTER — Inpatient Hospital Stay (HOSPITAL_COMMUNITY): Payer: Medicaid Other | Admitting: Certified Registered Nurse Anesthetist

## 2017-09-06 ENCOUNTER — Inpatient Hospital Stay (HOSPITAL_COMMUNITY)
Admission: EM | Disposition: A | Discharge status: Home / Self Care | Payer: Self-pay | Source: Home / Self Care | Attending: Internal Medicine

## 2017-09-06 HISTORY — PX: INSERTION OF IMPLANTABLE LEFT VENTRICULAR ASSIST DEVICE: SHX5866

## 2017-09-06 HISTORY — PX: TEE WITHOUT CARDIOVERSION: SHX5443

## 2017-09-06 LAB — POCT I-STAT 3, ART BLOOD GAS (G3+)
ACID-BASE EXCESS: 1 mmol/L (ref 0.0–2.0)
Acid-Base Excess: 2 mmol/L (ref 0.0–2.0)
Acid-Base Excess: 1 mmol/L (ref 0.0–2.0)
Acid-base deficit: 2 mmol/L (ref 0.0–2.0)
BICARBONATE: 27.2 mmol/L (ref 20.0–28.0)
BICARBONATE: 22.1 mmol/L (ref 20.0–28.0)
Bicarbonate: 27 mmol/L (ref 20.0–28.0)
Bicarbonate: 25.6 mmol/L (ref 20.0–28.0)
Bicarbonate: 26.2 mmol/L (ref 20.0–28.0)
O2 Saturation: 99 %
O2 Saturation: 100 %
O2 Saturation: 98 %
O2 Saturation: 96 %
O2 Saturation: 98 %
PH ART: 7.427 (ref 7.350–7.450)
Patient temperature: 35.9
TCO2: 29 mmol/L (ref 22–32)
TCO2: 28 mmol/L (ref 22–32)
TCO2: 27 mmol/L (ref 22–32)
TCO2: 28 mmol/L (ref 22–32)
TCO2: 23 mmol/L (ref 22–32)
pCO2 arterial: 51.6 mmHg — ABNORMAL HIGH (ref 32.0–48.0)
pCO2 arterial: 43.2 mmHg (ref 32.0–48.0)
pCO2 arterial: 40.4 mmHg (ref 32.0–48.0)
pCO2 arterial: 48.9 mmHg — ABNORMAL HIGH (ref 32.0–48.0)
pCO2 arterial: 33.8 mmHg (ref 32.0–48.0)
pH, Arterial: 7.33 — ABNORMAL LOW (ref 7.350–7.450)
pH, Arterial: 7.405 (ref 7.350–7.450)
pH, Arterial: 7.411 (ref 7.350–7.450)
pH, Arterial: 7.331 — ABNORMAL LOW (ref 7.350–7.450)
pO2, Arterial: 152 mmHg — ABNORMAL HIGH (ref 83.0–108.0)
pO2, Arterial: 299 mmHg — ABNORMAL HIGH (ref 83.0–108.0)
pO2, Arterial: 105 mmHg (ref 83.0–108.0)
pO2, Arterial: 82 mmHg — ABNORMAL LOW (ref 83.0–108.0)
pO2, Arterial: 108 mmHg (ref 83.0–108.0)

## 2017-09-06 LAB — POCT I-STAT, CHEM 8
BUN: 19 mg/dL (ref 6–20)
BUN: 17 mg/dL (ref 6–20)
BUN: 18 mg/dL (ref 6–20)
BUN: 16 mg/dL (ref 6–20)
BUN: 20 mg/dL (ref 6–20)
BUN: 14 mg/dL (ref 6–20)
CHLORIDE: 100 mmol/L — AB (ref 101–111)
CREATININE: 0.9 mg/dL (ref 0.61–1.24)
CREATININE: 0.8 mg/dL (ref 0.61–1.24)
Calcium, Ion: 1.3 mmol/L (ref 1.15–1.40)
Calcium, Ion: 1.03 mmol/L — ABNORMAL LOW (ref 1.15–1.40)
Calcium, Ion: 1.19 mmol/L (ref 1.15–1.40)
Calcium, Ion: 1.08 mmol/L — ABNORMAL LOW (ref 1.15–1.40)
Calcium, Ion: 1.11 mmol/L — ABNORMAL LOW (ref 1.15–1.40)
Calcium, Ion: 1.17 mmol/L (ref 1.15–1.40)
Chloride: 99 mmol/L — ABNORMAL LOW (ref 101–111)
Chloride: 102 mmol/L (ref 101–111)
Chloride: 98 mmol/L — ABNORMAL LOW (ref 101–111)
Chloride: 101 mmol/L (ref 101–111)
Chloride: 103 mmol/L (ref 101–111)
Creatinine, Ser: 0.7 mg/dL (ref 0.61–1.24)
Creatinine, Ser: 0.8 mg/dL (ref 0.61–1.24)
Creatinine, Ser: 0.7 mg/dL (ref 0.61–1.24)
Creatinine, Ser: 0.8 mg/dL (ref 0.61–1.24)
GLUCOSE: 112 mg/dL — AB (ref 65–99)
GLUCOSE: 148 mg/dL — AB (ref 65–99)
Glucose, Bld: 131 mg/dL — ABNORMAL HIGH (ref 65–99)
Glucose, Bld: 141 mg/dL — ABNORMAL HIGH (ref 65–99)
Glucose, Bld: 161 mg/dL — ABNORMAL HIGH (ref 65–99)
Glucose, Bld: 114 mg/dL — ABNORMAL HIGH (ref 65–99)
HCT: 35 % — ABNORMAL LOW (ref 39.0–52.0)
HCT: 26 % — ABNORMAL LOW (ref 39.0–52.0)
HCT: 33 % — ABNORMAL LOW (ref 39.0–52.0)
HCT: 25 % — ABNORMAL LOW (ref 39.0–52.0)
HCT: 24 % — ABNORMAL LOW (ref 39.0–52.0)
HEMATOCRIT: 27 % — AB (ref 39.0–52.0)
HEMOGLOBIN: 11.9 g/dL — AB (ref 13.0–17.0)
HEMOGLOBIN: 9.2 g/dL — AB (ref 13.0–17.0)
Hemoglobin: 11.2 g/dL — ABNORMAL LOW (ref 13.0–17.0)
Hemoglobin: 8.8 g/dL — ABNORMAL LOW (ref 13.0–17.0)
Hemoglobin: 8.5 g/dL — ABNORMAL LOW (ref 13.0–17.0)
Hemoglobin: 8.2 g/dL — ABNORMAL LOW (ref 13.0–17.0)
POTASSIUM: 4.3 mmol/L (ref 3.5–5.1)
POTASSIUM: 5.1 mmol/L (ref 3.5–5.1)
Potassium: 4.1 mmol/L (ref 3.5–5.1)
Potassium: 4.3 mmol/L (ref 3.5–5.1)
Potassium: 4.1 mmol/L (ref 3.5–5.1)
Potassium: 4.2 mmol/L (ref 3.5–5.1)
Sodium: 136 mmol/L (ref 135–145)
Sodium: 136 mmol/L (ref 135–145)
Sodium: 138 mmol/L (ref 135–145)
Sodium: 136 mmol/L (ref 135–145)
Sodium: 138 mmol/L (ref 135–145)
Sodium: 136 mmol/L (ref 135–145)
TCO2: 28 mmol/L (ref 22–32)
TCO2: 27 mmol/L (ref 22–32)
TCO2: 29 mmol/L (ref 22–32)
TCO2: 29 mmol/L (ref 22–32)
TCO2: 27 mmol/L (ref 22–32)
TCO2: 23 mmol/L (ref 22–32)

## 2017-09-06 LAB — POCT I-STAT 4, (NA,K, GLUC, HGB,HCT)
GLUCOSE: 106 mg/dL — AB (ref 65–99)
HEMATOCRIT: 27 % — AB (ref 39.0–52.0)
HEMOGLOBIN: 9.2 g/dL — AB (ref 13.0–17.0)
Potassium: 4.2 mmol/L (ref 3.5–5.1)
Sodium: 140 mmol/L (ref 135–145)

## 2017-09-06 LAB — CREATININE, SERUM
Creatinine, Ser: 0.9 mg/dL (ref 0.61–1.24)
GFR calc Af Amer: >60 mL/min (ref >60)
GFR calc non Af Amer: >60 mL/min (ref >60)

## 2017-09-06 LAB — CBC
HCT: 35.8 % — ABNORMAL LOW (ref 39.0–52.0)
HCT: 25.8 % — ABNORMAL LOW (ref 39.0–52.0)
HCT: 26.4 % — ABNORMAL LOW (ref 39.0–52.0)
Hemoglobin: 11.6 g/dL — ABNORMAL LOW (ref 13.0–17.0)
Hemoglobin: 8.4 g/dL — ABNORMAL LOW (ref 13.0–17.0)
Hemoglobin: 8.5 g/dL — ABNORMAL LOW (ref 13.0–17.0)
MCH: 28.9 pg (ref 26.0–34.0)
MCH: 28.9 pg (ref 26.0–34.0)
MCH: 28.4 pg (ref 26.0–34.0)
MCHC: 32.4 g/dL (ref 30.0–36.0)
MCHC: 32.6 g/dL (ref 30.0–36.0)
MCHC: 32.2 g/dL (ref 30.0–36.0)
MCV: 89.3 fL (ref 78.0–100.0)
MCV: 88.7 fL (ref 78.0–100.0)
MCV: 88.3 fL (ref 78.0–100.0)
PLATELETS: 202 10*3/uL (ref 150–400)
Platelets: 137 10*3/uL — ABNORMAL LOW (ref 150–400)
Platelets: 138 10*3/uL — ABNORMAL LOW (ref 150–400)
RBC: 4.01 MIL/uL — AB (ref 4.22–5.81)
RBC: 2.91 MIL/uL — ABNORMAL LOW (ref 4.22–5.81)
RBC: 2.99 MIL/uL — ABNORMAL LOW (ref 4.22–5.81)
RDW: 13.6 % (ref 11.5–15.5)
RDW: 13.3 % (ref 11.5–15.5)
RDW: 13.5 % (ref 11.5–15.5)
WBC: 9.7 10*3/uL (ref 4.0–10.5)
WBC: 18.3 10*3/uL — ABNORMAL HIGH (ref 4.0–10.5)
WBC: 13 10*3/uL — ABNORMAL HIGH (ref 4.0–10.5)

## 2017-09-06 LAB — COOXEMETRY PANEL
Carboxyhemoglobin: 1.6 % — ABNORMAL HIGH (ref 0.5–1.5)
Carboxyhemoglobin: 1.9 % — ABNORMAL HIGH (ref 0.5–1.5)
METHEMOGLOBIN: 1.3 % (ref 0.0–1.5)
Methemoglobin: 1.4 % (ref 0.0–1.5)
O2 SAT: 60.6 %
O2 Saturation: 68 %
TOTAL HEMOGLOBIN: 13.4 g/dL (ref 12.0–16.0)
Total hemoglobin: 8.4 g/dL — ABNORMAL LOW (ref 12.0–16.0)

## 2017-09-06 LAB — GLUCOSE, CAPILLARY
Glucose-Capillary: 96 mg/dL (ref 65–99)
Glucose-Capillary: 112 mg/dL — ABNORMAL HIGH (ref 65–99)
Glucose-Capillary: 117 mg/dL — ABNORMAL HIGH (ref 65–99)
Glucose-Capillary: 105 mg/dL — ABNORMAL HIGH (ref 65–99)
Glucose-Capillary: 120 mg/dL — ABNORMAL HIGH (ref 65–99)

## 2017-09-06 LAB — COMPREHENSIVE METABOLIC PANEL
ALT: 36 U/L (ref 17–63)
AST: 24 U/L (ref 15–41)
Albumin: 3 g/dL — ABNORMAL LOW (ref 3.5–5.0)
Alkaline Phosphatase: 76 U/L (ref 38–126)
Anion gap: 8 (ref 5–15)
BUN: 18 mg/dL (ref 6–20)
CHLORIDE: 99 mmol/L — AB (ref 101–111)
CO2: 26 mmol/L (ref 22–32)
CREATININE: 0.98 mg/dL (ref 0.61–1.24)
Calcium: 8.9 mg/dL (ref 8.9–10.3)
GFR calc Af Amer: >60 mL/min (ref >60)
Glucose, Bld: 118 mg/dL — ABNORMAL HIGH (ref 65–99)
POTASSIUM: 4.1 mmol/L (ref 3.5–5.1)
Sodium: 133 mmol/L — ABNORMAL LOW (ref 135–145)
Total Bilirubin: 1.3 mg/dL — ABNORMAL HIGH (ref 0.3–1.2)
Total Protein: 7 g/dL (ref 6.5–8.1)

## 2017-09-06 LAB — BASIC METABOLIC PANEL
Anion gap: 7 (ref 5–15)
BUN: 14 mg/dL (ref 6–20)
CO2: 25 mmol/L (ref 22–32)
Calcium: 8.2 mg/dL — ABNORMAL LOW (ref 8.9–10.3)
Chloride: 105 mmol/L (ref 101–111)
Creatinine, Ser: 0.87 mg/dL (ref 0.61–1.24)
GFR calc Af Amer: >60 mL/min (ref >60)
GFR calc non Af Amer: >60 mL/min (ref >60)
Glucose, Bld: 106 mg/dL — ABNORMAL HIGH (ref 65–99)
Potassium: 4.4 mmol/L (ref 3.5–5.1)
Sodium: 137 mmol/L (ref 135–145)

## 2017-09-06 LAB — POCT I-STAT 7, (LYTES, BLD GAS, ICA,H+H)
ACID-BASE EXCESS: 3 mmol/L — AB (ref 0.0–2.0)
Bicarbonate: 27.9 mmol/L (ref 20.0–28.0)
CALCIUM ION: 1.02 mmol/L — AB (ref 1.15–1.40)
HEMATOCRIT: 24 % — AB (ref 39.0–52.0)
Hemoglobin: 8.2 g/dL — ABNORMAL LOW (ref 13.0–17.0)
O2 Saturation: 99 %
POTASSIUM: 4.1 mmol/L (ref 3.5–5.1)
SODIUM: 140 mmol/L (ref 135–145)
TCO2: 29 mmol/L (ref 22–32)
pCO2 arterial: 47.1 mmHg (ref 32.0–48.0)
pH, Arterial: 7.382 (ref 7.350–7.450)
pO2, Arterial: 154 mmHg — ABNORMAL HIGH (ref 83.0–108.0)

## 2017-09-06 LAB — POCT I-STAT EG7
Acid-Base Excess: 1 mmol/L (ref 0.0–2.0)
BICARBONATE: 26.8 mmol/L (ref 20.0–28.0)
Calcium, Ion: 1.04 mmol/L — ABNORMAL LOW (ref 1.15–1.40)
HCT: 24 % — ABNORMAL LOW (ref 39.0–52.0)
Hemoglobin: 8.2 g/dL — ABNORMAL LOW (ref 13.0–17.0)
O2 Saturation: 65 %
Patient temperature: 37.4
Potassium: 4 mmol/L (ref 3.5–5.1)
Sodium: 141 mmol/L (ref 135–145)
TCO2: 28 mmol/L (ref 22–32)
pCO2, Ven: 50.5 mmHg (ref 44.0–60.0)
pH, Ven: 7.335 (ref 7.250–7.430)
pO2, Ven: 37 mmHg (ref 32.0–45.0)

## 2017-09-06 LAB — PROTIME-INR
INR: 1.38
Prothrombin Time: 16.9 seconds — ABNORMAL HIGH (ref 11.4–15.2)

## 2017-09-06 LAB — HEPARIN LEVEL (UNFRACTIONATED): HEPARIN UNFRACTIONATED: 0.14 [IU]/mL — AB (ref 0.30–0.70)

## 2017-09-06 LAB — POCT ACTIVATED CLOTTING TIME: ACTIVATED CLOTTING TIME: 120 s

## 2017-09-06 LAB — PLATELET COUNT: Platelets: 134 10*3/uL — ABNORMAL LOW (ref 150–400)

## 2017-09-06 LAB — MAGNESIUM
MAGNESIUM: 2.1 mg/dL (ref 1.7–2.4)
Magnesium: 1.9 mg/dL (ref 1.7–2.4)
Magnesium: 3.2 mg/dL — ABNORMAL HIGH (ref 1.7–2.4)

## 2017-09-06 LAB — APTT: aPTT: 36 seconds (ref 24–36)

## 2017-09-06 LAB — PREPARE RBC (CROSSMATCH)

## 2017-09-06 LAB — HEMOGLOBIN AND HEMATOCRIT, BLOOD
HCT: 23.3 % — ABNORMAL LOW (ref 39.0–52.0)
Hemoglobin: 7.6 g/dL — ABNORMAL LOW (ref 13.0–17.0)

## 2017-09-06 SURGERY — INSERTION OF IMPLANTABLE LEFT VENTRICULAR ASSIST DEVICE
Anesthesia: General | Site: Chest

## 2017-09-06 MED ORDER — SODIUM CHLORIDE 0.9 % IV SOLN
INTRAVENOUS | Status: DC | PRN
  Administered 2017-09-06: 750 mg via INTRAVENOUS

## 2017-09-06 MED ORDER — INSULIN REGULAR BOLUS VIA INFUSION
0.0000 [IU] | Freq: Three times a day (TID) | INTRAVENOUS | Status: DC
  Filled 2017-09-06: qty 10

## 2017-09-06 MED ORDER — SODIUM CHLORIDE 0.9% FLUSH: 10.0000 mL | INTRAVENOUS | Status: DC | PRN

## 2017-09-06 MED ORDER — FAMOTIDINE IN NACL 20-0.9 MG/50ML-% IV SOLN
20.0000 mg | Freq: Two times a day (BID) | INTRAVENOUS | Status: AC
  Administered 2017-09-06 – 2017-09-07 (×2): 20 mg via INTRAVENOUS
  Filled 2017-09-06: qty 50

## 2017-09-06 MED ORDER — NOREPINEPHRINE BITARTRATE 1 MG/ML IV SOLN
0.0000 ug/min | INTRAVENOUS | Status: AC
  Administered 2017-09-07: 6 ug/min via INTRAVENOUS
  Filled 2017-09-06: qty 4

## 2017-09-06 MED ORDER — ETOMIDATE 2 MG/ML IV SOLN
INTRAVENOUS | Status: AC
  Filled 2017-09-06: qty 10

## 2017-09-06 MED ORDER — MAGNESIUM SULFATE 4 GM/100ML IV SOLN
4.0000 g | Freq: Once | INTRAVENOUS | Status: AC
  Administered 2017-09-06: 4 g via INTRAVENOUS
  Filled 2017-09-06: qty 100

## 2017-09-06 MED ORDER — MIDAZOLAM HCL 2 MG/2ML IJ SOLN
INTRAMUSCULAR | Status: AC
  Filled 2017-09-06: qty 2

## 2017-09-06 MED ORDER — DOCUSATE SODIUM 100 MG PO CAPS
200.0000 mg | ORAL_CAPSULE | Freq: Every day | ORAL | Status: DC
  Administered 2017-09-07 – 2017-09-09 (×3): 200 mg via ORAL
  Filled 2017-09-06 (×3): qty 2

## 2017-09-06 MED ORDER — ONDANSETRON HCL 4 MG/2ML IJ SOLN
INTRAMUSCULAR | Status: AC
  Filled 2017-09-06: qty 2

## 2017-09-06 MED ORDER — BISACODYL 5 MG PO TBEC
10.0000 mg | DELAYED_RELEASE_TABLET | Freq: Every day | ORAL | Status: DC
  Administered 2017-09-07 – 2017-09-09 (×3): 10 mg via ORAL
  Filled 2017-09-06 (×3): qty 2

## 2017-09-06 MED ORDER — ROCURONIUM BROMIDE 10 MG/ML (PF) SYRINGE
PREFILLED_SYRINGE | INTRAVENOUS | Status: AC
  Filled 2017-09-06: qty 5

## 2017-09-06 MED ORDER — SODIUM CHLORIDE 0.9% FLUSH
3.0000 mL | Freq: Two times a day (BID) | INTRAVENOUS | Status: DC
  Administered 2017-09-08 – 2017-09-11 (×4): 3 mL via INTRAVENOUS

## 2017-09-06 MED ORDER — HEPARIN SODIUM (PORCINE) 1000 UNIT/ML IJ SOLN
INTRAMUSCULAR | Status: AC
  Filled 2017-09-06: qty 1

## 2017-09-06 MED ORDER — BISACODYL 10 MG RE SUPP: 10.0000 mg | Freq: Every day | RECTAL | Status: DC

## 2017-09-06 MED ORDER — ROCURONIUM BROMIDE 100 MG/10ML IV SOLN
INTRAVENOUS | Status: DC | PRN
  Administered 2017-09-06: 100 mg via INTRAVENOUS
  Administered 2017-09-06: 50 mg via INTRAVENOUS
  Administered 2017-09-06: 60 mg via INTRAVENOUS
  Administered 2017-09-06: 40 mg via INTRAVENOUS
  Administered 2017-09-06: 50 mg via INTRAVENOUS

## 2017-09-06 MED ORDER — LIDOCAINE 2% (20 MG/ML) 5 ML SYRINGE
INTRAMUSCULAR | Status: AC
  Filled 2017-09-06: qty 5

## 2017-09-06 MED ORDER — TRAMADOL HCL 50 MG PO TABS
50.0000 mg | ORAL_TABLET | ORAL | Status: DC | PRN
  Administered 2017-09-07 – 2017-09-13 (×12): 100 mg via ORAL
  Filled 2017-09-06 (×12): qty 2

## 2017-09-06 MED ORDER — LACTATED RINGERS IV SOLN: INTRAVENOUS | Status: DC

## 2017-09-06 MED ORDER — CHLORHEXIDINE GLUCONATE 0.12% ORAL RINSE (MEDLINE KIT)
15.0000 mL | Freq: Two times a day (BID) | OROMUCOSAL | Status: DC
  Administered 2017-09-07: 15 mL via OROMUCOSAL

## 2017-09-06 MED ORDER — SODIUM CHLORIDE 0.9 % IV SOLN
INTRAVENOUS | Status: AC
  Administered 2017-09-07: 5.4 [IU]/h via INTRAVENOUS
  Filled 2017-09-06: qty 1

## 2017-09-06 MED ORDER — FUROSEMIDE 10 MG/ML IJ SOLN
20.0000 mg | Freq: Once | INTRAMUSCULAR | Status: AC
  Administered 2017-09-06: 20 mg via INTRAVENOUS

## 2017-09-06 MED ORDER — ALBUTEROL SULFATE HFA 108 (90 BASE) MCG/ACT IN AERS
INHALATION_SPRAY | RESPIRATORY_TRACT | Status: DC | PRN
  Administered 2017-09-06: 5 via RESPIRATORY_TRACT

## 2017-09-06 MED ORDER — FENTANYL CITRATE (PF) 250 MCG/5ML IJ SOLN
INTRAMUSCULAR | Status: DC | PRN
  Administered 2017-09-06 (×2): 50 ug via INTRAVENOUS
  Administered 2017-09-06: 100 ug via INTRAVENOUS
  Administered 2017-09-06 (×8): 50 ug via INTRAVENOUS

## 2017-09-06 MED ORDER — MIDAZOLAM HCL 5 MG/5ML IJ SOLN
INTRAMUSCULAR | Status: DC | PRN
  Administered 2017-09-06 (×2): 2 mg via INTRAVENOUS
  Administered 2017-09-06: 1 mg via INTRAVENOUS
  Administered 2017-09-06: 2 mg via INTRAVENOUS
  Administered 2017-09-06 (×2): 1 mg via INTRAVENOUS
  Administered 2017-09-06 (×2): 2 mg via INTRAVENOUS
  Administered 2017-09-06: 1 mg via INTRAVENOUS

## 2017-09-06 MED ORDER — METOCLOPRAMIDE HCL 5 MG/ML IJ SOLN
10.0000 mg | Freq: Four times a day (QID) | INTRAMUSCULAR | Status: AC
  Administered 2017-09-06 – 2017-09-11 (×17): 10 mg via INTRAVENOUS
  Filled 2017-09-06 (×16): qty 2

## 2017-09-06 MED ORDER — SODIUM CHLORIDE 0.9 % IV SOLN
1.5000 g | Freq: Two times a day (BID) | INTRAVENOUS | Status: DC
  Administered 2017-09-06 – 2017-09-08 (×3): 1.5 g via INTRAVENOUS
  Filled 2017-09-06 (×4): qty 1.5

## 2017-09-06 MED ORDER — SODIUM CHLORIDE 0.9 % IV SOLN: 250.0000 mL | INTRAVENOUS | Status: DC

## 2017-09-06 MED ORDER — VANCOMYCIN HCL IN DEXTROSE 1-5 GM/200ML-% IV SOLN
1000.0000 mg | Freq: Two times a day (BID) | INTRAVENOUS | Status: DC
  Filled 2017-09-06: qty 200

## 2017-09-06 MED ORDER — MIDAZOLAM HCL 2 MG/2ML IJ SOLN
2.0000 mg | INTRAMUSCULAR | Status: DC | PRN
  Administered 2017-09-06 – 2017-09-07 (×4): 2 mg via INTRAVENOUS
  Filled 2017-09-06 (×5): qty 2

## 2017-09-06 MED ORDER — HEPARIN SODIUM (PORCINE) 1000 UNIT/ML IJ SOLN
INTRAMUSCULAR | Status: DC | PRN
  Administered 2017-09-06: 29000 [IU] via INTRAVENOUS

## 2017-09-06 MED ORDER — SODIUM CHLORIDE 0.9 % IV SOLN: 10.0000 mL/h | Freq: Once | INTRAVENOUS | Status: DC

## 2017-09-06 MED ORDER — SODIUM CHLORIDE 0.9 % IJ SOLN
INTRAMUSCULAR | Status: DC | PRN
  Administered 2017-09-06 (×3): 4 mL via TOPICAL

## 2017-09-06 MED ORDER — CHLORHEXIDINE GLUCONATE CLOTH 2 % EX PADS
6.0000 | MEDICATED_PAD | Freq: Every day | CUTANEOUS | Status: DC
  Administered 2017-09-06 – 2017-09-07 (×2): 6 via TOPICAL

## 2017-09-06 MED ORDER — ALBUTEROL SULFATE HFA 108 (90 BASE) MCG/ACT IN AERS
INHALATION_SPRAY | RESPIRATORY_TRACT | Status: AC
  Filled 2017-09-06: qty 6.7

## 2017-09-06 MED ORDER — ONDANSETRON HCL 4 MG/2ML IJ SOLN
4.0000 mg | Freq: Four times a day (QID) | INTRAMUSCULAR | Status: DC | PRN
  Administered 2017-09-07 – 2017-09-16 (×6): 4 mg via INTRAVENOUS
  Filled 2017-09-06 (×7): qty 2

## 2017-09-06 MED ORDER — LACTATED RINGERS IV SOLN: 500.0000 mL | Freq: Once | INTRAVENOUS | Status: DC | PRN

## 2017-09-06 MED ORDER — MIDAZOLAM HCL 10 MG/2ML IJ SOLN
INTRAMUSCULAR | Status: AC
  Filled 2017-09-06: qty 2

## 2017-09-06 MED ORDER — LACTATED RINGERS IV SOLN
INTRAVENOUS | Status: DC
  Administered 2017-09-06: 15:00:00 via INTRAVENOUS
  Administered 2017-09-07: 20 mL via INTRAVENOUS

## 2017-09-06 MED ORDER — PROTAMINE SULFATE 10 MG/ML IV SOLN
INTRAVENOUS | Status: AC
  Filled 2017-09-06: qty 25

## 2017-09-06 MED ORDER — HEMOSTATIC AGENTS (NO CHARGE) OPTIME
TOPICAL | Status: DC | PRN
  Administered 2017-09-06 (×4): 1 via TOPICAL

## 2017-09-06 MED ORDER — RIFAMPIN 300 MG PO CAPS
600.0000 mg | ORAL_CAPSULE | Freq: Once | ORAL | Status: AC
  Administered 2017-09-07: 600 mg via ORAL
  Filled 2017-09-06: qty 2

## 2017-09-06 MED ORDER — SODIUM CHLORIDE 0.9% FLUSH
10.0000 mL | Freq: Two times a day (BID) | INTRAVENOUS | Status: DC
  Administered 2017-09-06 – 2017-09-07 (×2): 10 mL
  Administered 2017-09-07: 40 mL

## 2017-09-06 MED ORDER — SODIUM CHLORIDE 0.9% FLUSH: 3.0000 mL | INTRAVENOUS | Status: DC | PRN

## 2017-09-06 MED ORDER — MILRINONE LACTATE IN DEXTROSE 20-5 MG/100ML-% IV SOLN
0.3750 ug/kg/min | INTRAVENOUS | Status: DC
  Administered 2017-09-06 – 2017-09-07 (×3): 0.375 ug/kg/min via INTRAVENOUS
  Administered 2017-09-08 – 2017-09-13 (×13): 0.4 ug/kg/min via INTRAVENOUS
  Administered 2017-09-13 – 2017-09-14 (×2): 0.375 ug/kg/min via INTRAVENOUS
  Filled 2017-09-06 (×18): qty 100

## 2017-09-06 MED ORDER — SODIUM CHLORIDE 0.45 % IV SOLN
INTRAVENOUS | Status: DC | PRN
  Administered 2017-09-06: 15:00:00 via INTRAVENOUS

## 2017-09-06 MED ORDER — EPINEPHRINE PF 1 MG/ML IJ SOLN
2.0000 ug/min | INTRAVENOUS | Status: DC
  Administered 2017-09-06 – 2017-09-07 (×2): 2 ug/min via INTRAVENOUS
  Administered 2017-09-10: 1 ug/min via INTRAVENOUS
  Filled 2017-09-06 (×3): qty 4

## 2017-09-06 MED ORDER — FENTANYL CITRATE (PF) 250 MCG/5ML IJ SOLN
INTRAMUSCULAR | Status: AC
  Filled 2017-09-06: qty 25

## 2017-09-06 MED ORDER — CHLORHEXIDINE GLUCONATE 0.12% ORAL RINSE (MEDLINE KIT)
15.0000 mL | Freq: Two times a day (BID) | OROMUCOSAL | Status: DC
  Administered 2017-09-06: 15 mL via OROMUCOSAL

## 2017-09-06 MED ORDER — ORAL CARE MOUTH RINSE: 15.0000 mL | Freq: Four times a day (QID) | OROMUCOSAL | Status: DC

## 2017-09-06 MED ORDER — ACETAMINOPHEN 160 MG/5ML PO SOLN
1000.0000 mg | Freq: Four times a day (QID) | ORAL | Status: AC
  Administered 2017-09-06 – 2017-09-07 (×3): 1000 mg
  Filled 2017-09-06 (×3): qty 40.6

## 2017-09-06 MED ORDER — DEXMEDETOMIDINE HCL IN NACL 200 MCG/50ML IV SOLN
0.1000 ug/kg/h | INTRAVENOUS | Status: DC
  Administered 2017-09-06 – 2017-09-07 (×6): 0.7 ug/kg/h via INTRAVENOUS
  Administered 2017-09-07: 0.3 ug/kg/h via INTRAVENOUS
  Filled 2017-09-06 (×6): qty 50

## 2017-09-06 MED ORDER — PANTOPRAZOLE SODIUM 40 MG PO TBEC
40.0000 mg | DELAYED_RELEASE_TABLET | Freq: Every day | ORAL | Status: DC
  Administered 2017-09-08 – 2017-09-19 (×12): 40 mg via ORAL
  Filled 2017-09-06 (×12): qty 1

## 2017-09-06 MED ORDER — LACTATED RINGERS IV SOLN
INTRAVENOUS | Status: DC | PRN
  Administered 2017-09-06: 09:00:00 via INTRAVENOUS

## 2017-09-06 MED ORDER — ACETAMINOPHEN 650 MG RE SUPP
650.0000 mg | Freq: Once | RECTAL | Status: AC
  Administered 2017-09-06: 650 mg via RECTAL

## 2017-09-06 MED ORDER — NITROGLYCERIN IN D5W 200-5 MCG/ML-% IV SOLN
INTRAVENOUS | Status: AC
Start: 2017-09-06 — End: 2017-09-07
  Filled 2017-09-06: qty 250

## 2017-09-06 MED ORDER — ORAL CARE MOUTH RINSE
15.0000 mL | OROMUCOSAL | Status: DC
  Administered 2017-09-07 (×8): 15 mL via OROMUCOSAL

## 2017-09-06 MED ORDER — FLUCONAZOLE IN SODIUM CHLORIDE 400-0.9 MG/200ML-% IV SOLN
400.0000 mg | Freq: Once | INTRAVENOUS | Status: AC
  Administered 2017-09-07: 400 mg via INTRAVENOUS
  Filled 2017-09-06: qty 200

## 2017-09-06 MED ORDER — SODIUM CHLORIDE 0.9 % IV SOLN: INTRAVENOUS | Status: DC

## 2017-09-06 MED ORDER — CHLORHEXIDINE GLUCONATE 0.12 % MT SOLN
15.0000 mL | OROMUCOSAL | Status: AC
  Administered 2017-09-06: 15 mL via OROMUCOSAL

## 2017-09-06 MED ORDER — POTASSIUM CHLORIDE 10 MEQ/50ML IV SOLN: 10.0000 meq | INTRAVENOUS | Status: AC

## 2017-09-06 MED ORDER — OXYCODONE HCL 5 MG PO TABS
5.0000 mg | ORAL_TABLET | ORAL | Status: DC | PRN
  Administered 2017-09-08: 5 mg via ORAL
  Administered 2017-09-08 – 2017-09-11 (×8): 10 mg via ORAL
  Administered 2017-09-11 (×2): 5 mg via ORAL
  Administered 2017-09-11 – 2017-09-17 (×20): 10 mg via ORAL
  Administered 2017-09-17: 5 mg via ORAL
  Administered 2017-09-17 – 2017-09-18 (×5): 10 mg via ORAL
  Administered 2017-09-18: 5 mg via ORAL
  Administered 2017-09-18 – 2017-09-19 (×3): 10 mg via ORAL
  Filled 2017-09-06: qty 1
  Filled 2017-09-06: qty 2
  Filled 2017-09-06 (×2): qty 1
  Filled 2017-09-06 (×12): qty 2
  Filled 2017-09-06: qty 1
  Filled 2017-09-06 (×12): qty 2
  Filled 2017-09-06: qty 1
  Filled 2017-09-06 (×11): qty 2

## 2017-09-06 MED ORDER — ALBUMIN HUMAN 5 % IV SOLN
INTRAVENOUS | Status: DC | PRN
  Administered 2017-09-06: 13:00:00 via INTRAVENOUS

## 2017-09-06 MED ORDER — ASPIRIN EC 325 MG PO TBEC
325.0000 mg | DELAYED_RELEASE_TABLET | Freq: Every day | ORAL | Status: DC
  Administered 2017-09-08 – 2017-09-10 (×3): 325 mg via ORAL
  Filled 2017-09-06 (×3): qty 1

## 2017-09-06 MED ORDER — PROTAMINE SULFATE 10 MG/ML IV SOLN
INTRAVENOUS | Status: DC | PRN
  Administered 2017-09-06: 280 mg via INTRAVENOUS
  Administered 2017-09-06: 10 mg via INTRAVENOUS

## 2017-09-06 MED ORDER — ETOMIDATE 2 MG/ML IV SOLN
INTRAVENOUS | Status: DC | PRN
  Administered 2017-09-06: 10 mg via INTRAVENOUS

## 2017-09-06 MED ORDER — LACTATED RINGERS IV SOLN
INTRAVENOUS | Status: DC | PRN
  Administered 2017-09-06: 10:00:00 via INTRAVENOUS

## 2017-09-06 MED ORDER — MORPHINE SULFATE (PF) 2 MG/ML IV SOLN
2.0000 mg | INTRAVENOUS | Status: DC | PRN
  Administered 2017-09-06: 2 mg via INTRAVENOUS
  Administered 2017-09-06: 4 mg via INTRAVENOUS
  Administered 2017-09-07: 2 mg via INTRAVENOUS
  Administered 2017-09-07 (×2): 4 mg via INTRAVENOUS
  Administered 2017-09-07: 2 mg via INTRAVENOUS
  Administered 2017-09-07 (×2): 4 mg via INTRAVENOUS
  Administered 2017-09-07: 2 mg via INTRAVENOUS
  Administered 2017-09-07 (×3): 4 mg via INTRAVENOUS
  Administered 2017-09-07: 2 mg via INTRAVENOUS
  Administered 2017-09-08 (×2): 4 mg via INTRAVENOUS
  Filled 2017-09-06: qty 2
  Filled 2017-09-06: qty 1
  Filled 2017-09-06 (×3): qty 2
  Filled 2017-09-06: qty 1
  Filled 2017-09-06 (×6): qty 2
  Filled 2017-09-06: qty 1
  Filled 2017-09-06: qty 2

## 2017-09-06 MED ORDER — PROTAMINE SULFATE 10 MG/ML IV SOLN
INTRAVENOUS | Status: AC
  Filled 2017-09-06: qty 5

## 2017-09-06 MED ORDER — ACETAMINOPHEN 500 MG PO TABS
1000.0000 mg | ORAL_TABLET | Freq: Four times a day (QID) | ORAL | Status: AC
  Administered 2017-09-07 – 2017-09-11 (×17): 1000 mg via ORAL
  Filled 2017-09-06 (×17): qty 2

## 2017-09-06 MED ORDER — VANCOMYCIN HCL 1000 MG IV SOLR
INTRAVENOUS | Status: AC
  Filled 2017-09-06: qty 1000

## 2017-09-06 MED ORDER — ASPIRIN 300 MG RE SUPP: 300.0000 mg | Freq: Every day | RECTAL | Status: DC

## 2017-09-06 MED ORDER — ACETAMINOPHEN 160 MG/5ML PO SOLN: 650.0000 mg | Freq: Once | ORAL | Status: AC

## 2017-09-06 MED ORDER — SODIUM CHLORIDE 0.9 % IR SOLN
Status: DC | PRN
  Administered 2017-09-06: 5000 mL

## 2017-09-06 MED ORDER — VANCOMYCIN HCL 10 G IV SOLR
1250.0000 mg | Freq: Two times a day (BID) | INTRAVENOUS | Status: DC
  Administered 2017-09-06 – 2017-09-07 (×3): 1250 mg via INTRAVENOUS
  Filled 2017-09-06 (×5): qty 1250

## 2017-09-06 MED ORDER — ASPIRIN 81 MG PO CHEW
324.0000 mg | CHEWABLE_TABLET | Freq: Every day | ORAL | Status: DC
  Administered 2017-09-07: 324 mg
  Filled 2017-09-06: qty 4

## 2017-09-06 MED ORDER — IPRATROPIUM-ALBUTEROL 0.5-2.5 (3) MG/3ML IN SOLN: 3.0000 mL | Freq: Four times a day (QID) | RESPIRATORY_TRACT | Status: DC | PRN

## 2017-09-06 MED ORDER — MORPHINE SULFATE (PF) 2 MG/ML IV SOLN
1.0000 mg | INTRAVENOUS | Status: AC | PRN
  Administered 2017-09-06 (×2): 2 mg via INTRAVENOUS
  Filled 2017-09-06: qty 2

## 2017-09-06 MED ORDER — ALBUMIN HUMAN 5 % IV SOLN
250.0000 mL | INTRAVENOUS | Status: AC | PRN
  Administered 2017-09-06 (×2): 250 mL via INTRAVENOUS

## 2017-09-06 SURGICAL SUPPLY — 121 items
ADAPTER CARDIO PERF ANTE/RETRO (ADAPTER) IMPLANT
ADAPTER DLP PERFUSION .25INX2I (MISCELLANEOUS) ×4 IMPLANT
ADPR CRDPLG .25X.64 STRL (MISCELLANEOUS) ×2
ADPR PRFSN 84XANTGRD RTRGD (ADAPTER)
ANTEGRADE CPLG (MISCELLANEOUS) IMPLANT
ATTRACTOMAT 16X20 MAGNETIC DRP (DRAPES) ×4 IMPLANT
BAG DECANTER FOR FLEXI CONT (MISCELLANEOUS) ×12 IMPLANT
BLADE STERNUM SYSTEM 6 (BLADE) ×4 IMPLANT
BLADE SURG 12 STRL SS (BLADE) ×4 IMPLANT
BLADE SURG 15 STRL LF DISP TIS (BLADE) IMPLANT
BLADE SURG 15 STRL SS (BLADE)
CANISTER SUCT 3000ML PPV (MISCELLANEOUS) ×4 IMPLANT
CANNULA ARTERIAL NVNT 3/8 20FR (MISCELLANEOUS) ×4 IMPLANT
CANNULA EZ GLIDE AORTIC 21FR (CANNULA) ×2 IMPLANT
CANNULA VENOUS LOW PROF 34X46 (CANNULA) ×4 IMPLANT
CATH CPB KIT VANTRIGT (MISCELLANEOUS) IMPLANT
CATH FOLEY 2WAY SLVR  5CC 14FR (CATHETERS) ×2
CATH FOLEY 2WAY SLVR 5CC 14FR (CATHETERS) ×2 IMPLANT
CATH HEART VENT LEFT (CATHETERS) IMPLANT
CATH HYDRAGLIDE XL THORACIC (CATHETERS) ×4 IMPLANT
CATH ROBINSON RED A/P 18FR (CATHETERS) ×8 IMPLANT
CATH THORACIC 28FR (CATHETERS) IMPLANT
CATH THORACIC 36FR RT ANG (CATHETERS) IMPLANT
CHLORAPREP W/TINT 26ML (MISCELLANEOUS) ×4 IMPLANT
CONN ST 1/4X3/8  BEN (MISCELLANEOUS) ×2
CONN ST 1/4X3/8 BEN (MISCELLANEOUS) IMPLANT
CONT SPEC 4OZ CLIKSEAL STRL BL (MISCELLANEOUS) ×2 IMPLANT
COVER SURGICAL LIGHT HANDLE (MISCELLANEOUS) ×4 IMPLANT
CRADLE DONUT ADULT HEAD (MISCELLANEOUS) ×4 IMPLANT
DRAIN CHANNEL 28F RND 3/8 FF (WOUND CARE) IMPLANT
DRAIN CHANNEL 32F RND 10.7 FF (WOUND CARE) IMPLANT
DRAPE BILATERAL SPLIT (DRAPES) ×4 IMPLANT
DRAPE CAMERA CLOSED 9X96 (DRAPES) ×4 IMPLANT
DRAPE CV SPLIT W-CLR ANES SCRN (DRAPES) ×4 IMPLANT
DRAPE INCISE IOBAN 66X45 STRL (DRAPES) ×4 IMPLANT
DRAPE SLUSH/WARMER DISC (DRAPES) ×4 IMPLANT
DRSG AQUACEL AG ADV 3.5X14 (GAUZE/BANDAGES/DRESSINGS) ×4 IMPLANT
ELECT BLADE 4.0 EZ CLEAN MEGAD (MISCELLANEOUS) ×4
ELECT BLADE 6.5 EXT (BLADE) ×4 IMPLANT
ELECT CAUTERY BLADE 6.4 (BLADE) ×4 IMPLANT
ELECT REM PT RETURN 9FT ADLT (ELECTROSURGICAL) ×4
ELECTRODE BLDE 4.0 EZ CLN MEGD (MISCELLANEOUS) ×2 IMPLANT
ELECTRODE REM PT RTRN 9FT ADLT (ELECTROSURGICAL) ×2 IMPLANT
FELT TEFLON 6X6 (MISCELLANEOUS) ×4 IMPLANT
GAUZE SPONGE 4X4 12PLY STRL (GAUZE/BANDAGES/DRESSINGS) ×6 IMPLANT
GLOVE BIO SURGEON STRL SZ 6 (GLOVE) ×6 IMPLANT
GLOVE BIO SURGEON STRL SZ 6.5 (GLOVE) ×4 IMPLANT
GLOVE BIO SURGEON STRL SZ7.5 (GLOVE) ×16 IMPLANT
GLOVE BIO SURGEONS STRL SZ 6.5 (GLOVE) ×4
GOWN STRL REUS W/ TWL LRG LVL3 (GOWN DISPOSABLE) ×8 IMPLANT
GOWN STRL REUS W/ TWL XL LVL3 (GOWN DISPOSABLE) ×4 IMPLANT
GOWN STRL REUS W/TWL LRG LVL3 (GOWN DISPOSABLE) ×32
GOWN STRL REUS W/TWL XL LVL3 (GOWN DISPOSABLE)
HEMOSTAT POWDER SURGIFOAM 1G (HEMOSTASIS) ×16 IMPLANT
HEMOSTAT SURGICEL 2X14 (HEMOSTASIS) IMPLANT
INSERT FOGARTY XLG (MISCELLANEOUS) IMPLANT
IV NS 1000ML (IV SOLUTION) ×4
IV NS 1000ML BAXH (IV SOLUTION) IMPLANT
KIT BASIN OR (CUSTOM PROCEDURE TRAY) ×4 IMPLANT
KIT LVAD HEARTMATE 3 W-CNTRL (Prosthesis & Implant Heart) ×1 IMPLANT
KIT LVAD HEARTMATE III W-CNTRL (Prosthesis & Implant Heart) ×1 IMPLANT
KIT SUCTION CATH 14FR (SUCTIONS) ×4 IMPLANT
KIT TURNOVER KIT B (KITS) ×4 IMPLANT
LEAD PACING MYOCARDI (MISCELLANEOUS) IMPLANT
LINE VENT (MISCELLANEOUS) ×2 IMPLANT
NDL PERC 18GX7CM (NEEDLE) IMPLANT
NEEDLE PERC 18GX7CM (NEEDLE) ×4 IMPLANT
NS IRRIG 1000ML POUR BTL (IV SOLUTION) ×22 IMPLANT
PACK OPEN HEART (CUSTOM PROCEDURE TRAY) ×4 IMPLANT
PAD ARMBOARD 7.5X6 YLW CONV (MISCELLANEOUS) ×8 IMPLANT
PAD DEFIB R2 (MISCELLANEOUS) ×4 IMPLANT
POWDER SURGICEL 3.0 GRAM (HEMOSTASIS) ×2 IMPLANT
PUNCH AORTIC ROTATE 4.5MM 8IN (MISCELLANEOUS) ×4 IMPLANT
SEALANT SURG COSEAL 8ML (VASCULAR PRODUCTS) ×4 IMPLANT
SET CARDIOPLEGIA MPS 5001102 (MISCELLANEOUS) ×2 IMPLANT
SHEATH AVANTI 11CM 5FR (SHEATH) ×2 IMPLANT
SPONGE LAP 18X18 X RAY DECT (DISPOSABLE) ×8 IMPLANT
STOPCOCK 4 WAY LG BORE MALE ST (IV SETS) ×4 IMPLANT
SUCKER INTRACARDIAC WEIGHTED (SUCKER) ×4 IMPLANT
SUT ETHIBOND 2 0 SH (SUTURE) ×20
SUT ETHIBOND 2 0 SH 36X2 (SUTURE) ×10 IMPLANT
SUT ETHIBOND 5 LR DA (SUTURE) ×8 IMPLANT
SUT ETHIBOND NAB MH 2-0 36IN (SUTURE) ×48 IMPLANT
SUT PROLENE 3 0 RB 1 (SUTURE) IMPLANT
SUT PROLENE 3 0 SH DA (SUTURE) ×8 IMPLANT
SUT PROLENE 4 0 RB 1 (SUTURE) ×32
SUT PROLENE 4-0 RB1 .5 CRCL 36 (SUTURE) ×8 IMPLANT
SUT PROLENE 5 0 C1 (SUTURE) IMPLANT
SUT PROLENE 6 0 C 1 30 (SUTURE) ×18 IMPLANT
SUT SILK  1 MH (SUTURE) ×8
SUT SILK 1 MH (SUTURE) ×8 IMPLANT
SUT SILK 1 TIES 10X30 (SUTURE) ×4 IMPLANT
SUT SILK 2 0 SH CR/8 (SUTURE) ×12 IMPLANT
SUT STEEL 6MS V (SUTURE) ×8 IMPLANT
SUT STEEL SZ 6 DBL 3X14 BALL (SUTURE) ×4 IMPLANT
SUT TEM PAC WIRE 2 0 SH (SUTURE) ×2 IMPLANT
SUT VIC AB 1 CTX 18 (SUTURE) ×2 IMPLANT
SUT VIC AB 1 CTX 36 (SUTURE) ×12
SUT VIC AB 1 CTX36XBRD ANBCTR (SUTURE) ×4 IMPLANT
SUT VIC AB 2-0 CTX 27 (SUTURE) ×8 IMPLANT
SUT VIC AB 3-0 SH 8-18 (SUTURE) ×4 IMPLANT
SUT VIC AB 3-0 X1 27 (SUTURE) ×10 IMPLANT
SUT VICRYL 2 TP 1 (SUTURE) IMPLANT
SYR 10ML LL (SYRINGE) ×4 IMPLANT
SYR 20CC LL (SYRINGE) ×2 IMPLANT
SYR 50ML LL SCALE MARK (SYRINGE) ×4 IMPLANT
SYR 5ML LL (SYRINGE) ×2 IMPLANT
SYSTEM SAHARA CHEST DRAIN ATS (WOUND CARE) ×4 IMPLANT
TAPE CLOTH SURG 4X10 WHT LF (GAUZE/BANDAGES/DRESSINGS) ×2 IMPLANT
TAPE PAPER 2X10 WHT MICROPORE (GAUZE/BANDAGES/DRESSINGS) ×2 IMPLANT
TAPE STRIPS DRAPE STRL (GAUZE/BANDAGES/DRESSINGS) ×4 IMPLANT
TOWEL GREEN STERILE (TOWEL DISPOSABLE) ×4 IMPLANT
TOWEL GREEN STERILE FF (TOWEL DISPOSABLE) ×4 IMPLANT
TRAY CATH LUMEN 1 20CM STRL (SET/KITS/TRAYS/PACK) ×4 IMPLANT
TRAY FOLEY SLVR 14FR TEMP STAT (SET/KITS/TRAYS/PACK) ×4 IMPLANT
TUBE CONNECTING 12'X1/4 (SUCTIONS) ×1
TUBE CONNECTING 12X1/4 (SUCTIONS) ×3 IMPLANT
UNDERPAD 30X30 (UNDERPADS AND DIAPERS) ×4 IMPLANT
VENT LEFT HEART 12002 (CATHETERS)
WATER STERILE IRR 1000ML POUR (IV SOLUTION) ×8 IMPLANT
YANKAUER SUCT BULB TIP NO VENT (SUCTIONS) ×4 IMPLANT

## 2017-09-06 NOTE — Anesthesia Procedure Notes (Signed)
Central Venous Catheter Insertion Performed by: Kipp Brood, MD, anesthesiologist Start/End4/23/2019 8:10 AM, 09/06/2017 8:17 AM Patient location: Pre-op. Preanesthetic checklist: patient identified, IV checked, site marked, risks and benefits discussed, surgical consent, monitors and equipment checked, pre-op evaluation, timeout performed and anesthesia consent Lidocaine 1% used for infiltration and patient sedated Hand hygiene performed  and maximum sterile barriers used  Catheter size: 8.5 Fr Sheath introducer Procedure performed using ultrasound guided technique. Ultrasound Notes:anatomy identified, needle tip was noted to be adjacent to the nerve/plexus identified, no ultrasound evidence of intravascular and/or intraneural injection and image(s) printed for medical record Attempts: 1 Following insertion, line sutured and dressing applied. Post procedure assessment: blood return through all ports, free fluid flow and no air  Patient tolerated the procedure well with no immediate complications.

## 2017-09-06 NOTE — Progress Notes (Addendum)
Pharmacy Antibiotic Note  Jonathan Wood is a 47 y.o. male admitted on 08/25/2017 with surgical prophylaxis.  Pharmacy has been consulted for vancomycin dosing.  Underwent LVAD placement today on 4/23. Received vancomycin dose on 4/23 at 0830. Scr 0.8 post-op, Scr 1.06 pre-op. WBC 9.7>18.3 post-op likely reactive. Consult for vancomycin for 48 hours - will dose conservatively and continue at every 12 hour interval unless continues past 48 hours.   Plan: Vancomycin 1250 mg  IV every 12 hours.  Goal trough 15-20 mcg/mL.  Monitor clinical pic, renal function, and 48 hour course for surgical ppx  Height: 6\' 2"  (188 cm) Weight: 189 lb 4.8 oz (85.9 kg) IBW/kg (Calculated) : 82.2  Temp (24hrs), Avg:97.2 F (36.2 C), Min:96.4 F (35.8 C), Max:98.1 F (36.7 C)  Recent Labs  Lab 09/02/17 0803 09/03/17 0420 09/04/17 0448 09/05/17 0359 09/06/17 0430 09/06/17 0842 09/06/17 1008 09/06/17 1027 09/06/17 1128 09/06/17 1244  WBC 8.2 11.7* 10.1 9.3 9.7  --   --   --   --   --   CREATININE  --  0.97 1.25* 1.06 0.98 0.90 0.80 0.70 0.70 0.80    Estimated Creatinine Clearance: 137 mL/min (by C-G formula based on SCr of 0.8 mg/dL).    No Known Allergies  Thank you for allowing pharmacy to be a part of this patient's care.  Girard Cooter, PharmD Clinical Pharmacist  Pager: [phone] Clinical Phone for 09/06/2017 until 3:30pm: [phone] If after 3:30pm, please call main pharmacy at [phone] 09/06/2017 3:26 PM

## 2017-09-06 NOTE — Brief Op Note (Addendum)
08/25/2017 - 09/06/2017  2:09 PM  PATIENT:  Jonathan Wood  47 y.o. male  PRE-OPERATIVE DIAGNOSIS:  1. ICM 2. Cardiogenic shock  POST-OPERATIVE DIAGNOSIS: 1. ICM 2. Cardiogenic shock  PROCEDURE: TRANSESOPHAGEAL ECHOCARDIOGRAM (TEE), MEDIAN STERNOTOMY for INSERTION OF IMPLANTABLE LEFT VENTRICULAR ASSIST DEVICE - HM3    SURGEON:  Surgeon(s) and Role:    Kerin Perna, MD - Primary  PHYSICIAN ASSISTANT: Doree Fudge PA-C  ASSISTANTS: Virgilio Frees RNFA  ANESTHESIA:   general  EBL:  700 mL    Findings :  no LV apical thrombus but thick trabeculae                       present  BLOOD ADMINISTERED:Three FFP and two PLTS  DRAINS: Chest tubes placed in the mediastinal and pleural spaces   COUNTS:  YES  TDICTATION: .Dragon Dictation  PLAN OF CARE: Admit to inpatient   PATIENT DISPOSITION:  ICU - intubated and hemodynamically stable.   Delay start of Pharmacological VTE agent (>24hrs) due to surgical blood loss or risk of bleeding: yes  BASELINE WEIGHT: 85.9 kg

## 2017-09-06 NOTE — Transfer of Care (Signed)
Immediate Anesthesia Transfer of Care Note  Patient: Jonathan Wood  Procedure(s) Performed: INSERTION OF IMPLANTABLE LEFT VENTRICULAR ASSIST DEVICE - HM3 (N/A Chest) TRANSESOPHAGEAL ECHOCARDIOGRAM (TEE) (N/A )  Patient Location: SICU  Anesthesia Type:General  Level of Consciousness: sedated and Patient remains intubated per anesthesia plan  Airway & Oxygen Therapy: Patient remains intubated per anesthesia plan and Patient placed on Ventilator (see vital sign flow sheet for setting)  Post-op Assessment: Report given to RN and Post -op Vital signs reviewed and stable  Post vital signs: Reviewed and stable  Last Vitals:  Vitals Value Taken Time  BP    Temp 35.8 C 09/06/2017  2:55 PM  Pulse    Resp 12 09/06/2017  2:55 PM  SpO2 100 % 09/06/2017  2:55 PM  Vitals shown include unvalidated device data.  Last Pain:  Vitals:   09/06/17 0530  TempSrc:   PainSc: 3       Patients Stated Pain Goal: 0 (09/06/17 0500)  Complications: No apparent anesthesia complications

## 2017-09-06 NOTE — Progress Notes (Signed)
Order for sheath removal verified per post procedural orders. Patient status post LVAD today and remains intubated during removal of IABP. No family present during removal process. Right femoral artery access site assessed: level 0, palpable dorsalis pedis and posterior tibial pulses. 8 [place] Sheath removed and manual pressure applied for 15 minutes. Pre, peri, & post procedural vitals: HR 100, RR 16, Map of 83 via A-line, Pain level 0 and tolerating the ETT well. Distal pulses remained intact after sheath removal. Access site level 0 and dressed with 4X4 gauze and tegaderm.  Malachy Chamber, RN confirmed condition of site. Post procedural instructions discussed with staff.

## 2017-09-06 NOTE — Anesthesia Procedure Notes (Signed)
Procedure Name: Intubation Date/Time: 09/06/2017 8:08 AM Performed by: Verdie Drown, CRNA Pre-anesthesia Checklist: Patient identified, Emergency Drugs available, Suction available and Patient being monitored Patient Re-evaluated:Patient Re-evaluated prior to induction Oxygen Delivery Method: Circle System Utilized Preoxygenation: Pre-oxygenation with 100% oxygen Induction Type: IV induction Ventilation: Mask ventilation without difficulty Laryngoscope Size: Mac and 3 Grade View: Grade II Tube type: Oral Number of attempts: 1 Airway Equipment and Method: Stylet Placement Confirmation: ETT inserted through vocal cords under direct vision,  positive ETCO2 and breath sounds checked- equal and bilateral Secured at: 21 cm Tube secured with: Tape Dental Injury: Teeth and Oropharynx as per pre-operative assessment

## 2017-09-06 NOTE — Anesthesia Procedure Notes (Signed)
Central Venous Catheter Insertion Performed by: Kipp Brood, MD, anesthesiologist Start/End4/23/2019 8:10 AM, 09/06/2017 8:16 AM Patient location: Pre-op. Preanesthetic checklist: patient identified, IV checked, site marked, risks and benefits discussed, surgical consent, monitors and equipment checked, pre-op evaluation, timeout performed and anesthesia consent Hand hygiene performed  and maximum sterile barriers used  PA cath was placed.Swan type:thermodilution Procedure performed without using ultrasound guided technique. Attempts: 1 Patient tolerated the procedure well with no immediate complications.

## 2017-09-06 NOTE — Care Management Note (Signed)
Case Management Note Previous CM note completed by [name], RN 08/30/2017, 9:53 AM   Patient Details  Name: Jonathan Wood MRN: [id number] Date of Birth: [birth year]  Subjective/Objective:   Pt admitted with SOB - HF                 Action/Plan:  PTA independent from home.  Pt confirmed he does not have insurance nor PCP.  Pt declined for CM to set up appt in a [place] clinic due to drive time associated with a PCP in [place].  Pt was only receptive to CM providing both [employer] and [employer]  information on AVS.  Neither [facility] nor [facility] had appts and pt is not within zip code parameters for [facility].  Per HF RN - pt will be monitored by HF SW and Pharm D for ongoing needs post discharge.  HF team to provide discharge medications.  Additional CM follow up notes:  09/06/17- 1600- [name] RN, CM- pt s/p HM3 VAD placement today- CM to continue to follow for transition of care needs  08/31/17- 1000- [name] RN, CM- pt tx to [facility] post cath- Admitted with recurrent heart failure, cardiogenic shock placed on IV inotrope-milrinone Plan for evaluation for implantable VAD- CM to follow for transition of care needs.    Expected Discharge Date:                  Expected Discharge Plan:  Home/Self Care  In-House Referral:     Discharge planning Services  CM Consult  Post Acute Care Choice:    Choice offered to:     DME Arranged:    DME Agency:     HH Arranged:    HH Agency:     Status of Service:     If discussed at Long Length of Stay Meetings, dates discussed:    Discharge Disposition:   Additional Comments:  [name], RN 09/06/2017, 3:59 PM [phone] 4E Transition Care Coordinator 2H coverage

## 2017-09-06 NOTE — Progress Notes (Signed)
Pre Procedure note for inpatients:   Jonathan Wood has been scheduled for Procedure(s) with comments: INSERTION OF IMPLANTABLE LEFT VENTRICULAR ASSIST DEVICE - HM3 (N/A) - HM3 TRANSESOPHAGEAL ECHOCARDIOGRAM (TEE) (N/A) today. The various methods of treatment have been discussed with the patient. After consideration of the risks, benefits and treatment options the patient has consented to the planned procedure.   The patient has been seen and labs reviewed. There are no changes in the patient's condition to prevent proceeding with the planned procedure today.  Recent labs:  Lab Results  Component Value Date   WBC 9.7 09/06/2017   HGB 11.6 (L) 09/06/2017   HCT 35.8 (L) 09/06/2017   PLT 202 09/06/2017   GLUCOSE 118 (H) 09/06/2017   CHOL 111 08/30/2017   TRIG 38 08/30/2017   HDL 33 (L) 08/30/2017   LDLCALC 70 08/30/2017   ALT 36 09/06/2017   AST 24 09/06/2017   NA 133 (L) 09/06/2017   K 4.1 09/06/2017   CL 99 (L) 09/06/2017   CREATININE 0.98 09/06/2017   BUN 18 09/06/2017   CO2 26 09/06/2017   TSH 1.957 08/30/2017   INR 1.14 08/30/2017   HGBA1C 5.2 08/30/2017    Mikey Bussing, MD 09/06/2017 7:53 AM

## 2017-09-06 NOTE — Progress Notes (Signed)
Dr. Donata Clay aware that CVP remains elevated at 20-22 after Lasix 20mg . Maps in the 70s. VAD flow 5.3, PI 1.8-2.1. PA pressures 28/17-18. Order received to give an additional Lasix 20mg  and to increase Epi to . Will continue to closely monitor. Thresa Ross RN

## 2017-09-06 NOTE — Progress Notes (Signed)
Nitric started in OR this am. Pt placed pt on vent for transport to 2H08. Pt transported on vent with Nitric to 2H08 without complication.

## 2017-09-06 NOTE — Progress Notes (Signed)
  Echocardiogram Echocardiogram Transesophageal has been performed.  Jonathan Wood 09/06/2017, 9:08 AM

## 2017-09-06 NOTE — Anesthesia Procedure Notes (Addendum)
Central Venous Catheter Insertion Performed by: Kipp Brood, MD, anesthesiologist Start/End4/23/2019 8:10 AM, 09/06/2017 8:16 AM Patient location: Pre-op. Preanesthetic checklist: patient identified, IV checked, site marked, risks and benefits discussed, surgical consent, monitors and equipment checked, pre-op evaluation, timeout performed and anesthesia consent Position: Trendelenburg Lidocaine 1% used for infiltration and patient sedated Hand hygiene performed , maximum sterile barriers used  and Seldinger technique used Catheter size: 8 Fr Total catheter length 16. Central line was placed.Double lumen Procedure performed using ultrasound guided technique. Ultrasound Notes:anatomy identified, needle tip was noted to be adjacent to the nerve/plexus identified, no ultrasound evidence of intravascular and/or intraneural injection and image(s) printed for medical record Attempts: 1 Following insertion, dressing applied, line sutured and Biopatch. Post procedure assessment: blood return through all ports  Patient tolerated the procedure well with no immediate complications.

## 2017-09-06 NOTE — Progress Notes (Signed)
CSW met with patient's family in the waiting room. Family appear to have good understanding of surgery and events of the day. Family appear to be coping well under very stressful conditions. CSW provided supportive intervention and will continue to follow throughout implant hospitalization. [name], [place], [place]

## 2017-09-06 NOTE — Anesthesia Preprocedure Evaluation (Addendum)
Anesthesia Evaluation  Patient identified by MRN, date of birth, ID band Patient awake    Reviewed: Allergy & Precautions, NPO status , Patient's Chart, lab work & pertinent test results  Airway Mallampati: II  TM Distance: >3 FB Neck ROM: Full    Dental  (+) Teeth Intact, Dental Advisory Given   Pulmonary Current Smoker,     + decreased breath sounds      Cardiovascular  Rhythm:Regular Rate:Normal     Neuro/Psych    GI/Hepatic   Endo/Other    Renal/GU      Musculoskeletal   Abdominal   Peds  Hematology   Anesthesia Other Findings   Reproductive/Obstetrics                            Anesthesia Physical Anesthesia Plan  ASA: III  Anesthesia Plan: General   Post-op Pain Management:    Induction: Intravenous  PONV Risk Score and Plan: Ondansetron and Dexamethasone  Airway Management Planned: Oral ETT  Additional Equipment: Arterial line, CVP, PA Cath, 3D TEE and Ultrasound Guidance Line Placement  Intra-op Plan:   Post-operative Plan: Post-operative intubation/ventilation  Informed Consent: I have reviewed the patients History and Physical, chart, labs and discussed the procedure including the risks, benefits and alternatives for the proposed anesthesia with the patient or authorized representative who has indicated his/her understanding and acceptance.     Plan Discussed with: CRNA and Anesthesiologist  Anesthesia Plan Comments:         Anesthesia Quick Evaluation

## 2017-09-06 NOTE — OR Nursing (Signed)
13:00 - 45 minute call to SICU charge nurse 13:40 - 20 minute call to SICU charge nurse

## 2017-09-06 NOTE — Anesthesia Procedure Notes (Signed)
Arterial Line Insertion Start/End4/23/2019 8:00 AM, 09/06/2017 8:03 AM Performed by: Kipp Brood, MD, Rosalio Macadamia, CRNA, CRNA  Patient location: OR. Preanesthetic checklist: patient identified, IV checked, site marked, risks and benefits discussed, surgical consent, monitors and equipment checked, pre-op evaluation, timeout performed and anesthesia consent Lidocaine 1% used for infiltration and patient sedated radial was placed Catheter size: 20 G Hand hygiene performed  and Seldinger technique used Allen's test indicative of satisfactory collateral circulation Attempts: 2 Procedure performed without using ultrasound guided technique. Following insertion, dressing applied and Biopatch. Post procedure assessment: normal  Patient tolerated the procedure well with no immediate complications.

## 2017-09-06 NOTE — Anesthesia Postprocedure Evaluation (Signed)
Anesthesia Post Note  Patient: DAI DUHN  Procedure(s) Performed: INSERTION OF IMPLANTABLE LEFT VENTRICULAR ASSIST DEVICE - HM3 (N/A Chest) TRANSESOPHAGEAL ECHOCARDIOGRAM (TEE) (N/A )     Patient location during evaluation: SICU Anesthesia Type: General Level of consciousness: sedated Pain management: pain level controlled Vital Signs Assessment: post-procedure vital signs reviewed and stable Respiratory status: patient remains intubated per anesthesia plan and patient on ventilator - see flowsheet for VS Cardiovascular status: stable and blood pressure returned to baseline Postop Assessment: no apparent nausea or vomiting Anesthetic complications: no    Last Vitals:  Vitals:   09/06/17 1600 09/06/17 1615  BP:    Pulse:    Resp: 16 16  Temp: (!) 35.9 C (!) 35.9 C  SpO2: 93%     Last Pain:  Vitals:   09/06/17 1600  TempSrc: Core  PainSc:                  [place] [employer]

## 2017-09-06 NOTE — Progress Notes (Signed)
Advanced Heart Failure Rounding Note  PCP-Cardiologist: No primary care provider on file.   Subjective:    IABP placed yesterday. Now on Milrinone 0.25 NE 4. Co-ox up to 61%   Diuresed well. Weight down 1 pound.   Remains in NSR. Breathing bed. No orthopnea. For VAD today. Family at bedside.    Objective:   Weight Range: 85.9 kg (189 lb 4.8 oz) Body mass index is 24.3 kg/m.   Vital Signs:   Temp:  [97.4 F (36.3 C)-98.1 F (36.7 C)] 97.4 F (36.3 C) (04/23 0000) Pulse Rate:  [75-109] 80 (04/22 1009) Resp:  [4-93] 22 (04/23 0630) BP: (95-123)/(47-91) 97/47 (04/22 1500) SpO2:  [91 %-100 %] 95 % (04/23 0630) Arterial Line BP: (83-113)/(49-67) 88/52 (04/23 0630) Last BM Date: 09/04/17  Weight change: Filed Weights   09/03/17 0500 09/04/17 0500 09/05/17 0400  Weight: 86.2 kg (190 lb 0.6 oz) 86.5 kg (190 lb 11.2 oz) 85.9 kg (189 lb 4.8 oz)    Intake/Output:   Intake/Output Summary (Last 24 hours) at 09/06/2017 0810 Last data filed at 09/06/2017 0600 Gross per 24 hour  Intake 1922.98 ml  Output 2426 ml  Net -503.02 ml      Physical Exam   General:  Lying flat in bed. No resp difficulty HEENT: normal Neck: supple. JVP 6 Carotids 2+ bilat; no bruits. No lymphadenopathy or thryomegaly appreciated. Cor: PMI laterally displaced. Regular rate & rhythm. +s3 Lungs: clear Abdomen: soft, nontender, nondistended. No hepatosplenomegaly. No bruits or masses. Good bowel sounds. Extremities: no cyanosis, clubbing, rash, edema RFA IABP  Neuro: alert & orientedx3, cranial nerves grossly intact. moves all 4 extremities w/o difficulty. Affect pleasant   Telemetry   Sinus 80s, personally reviewed.   EKG    No new tracings.    Labs    CBC Recent Labs    09/05/17 0359 09/06/17 0430  WBC 9.3 9.7  HGB 11.2* 11.6*  HCT 34.7* 35.8*  MCV 87.8 89.3  PLT 221 202   Basic Metabolic Panel Recent Labs    16/10/96 0359 09/06/17 0430  NA 133* 133*  K 4.1 4.1  CL 103  99*  CO2 22 26  GLUCOSE 108* 118*  BUN 27* 18  CREATININE 1.06 0.98  CALCIUM 9.0 8.9  MG 2.2 2.1   Liver Function Tests Recent Labs    09/05/17 0359 09/06/17 0430  AST 22 24  ALT 39 36  ALKPHOS 82 76  BILITOT 0.9 1.3*  PROT 6.9 7.0  ALBUMIN 2.9* 3.0*   No results for input(s): LIPASE, AMYLASE in the last 72 hours. Cardiac Enzymes No results for input(s): CKTOTAL, CKMB, CKMBINDEX, TROPONINI in the last 72 hours.  BNP: BNP (last 3 results) Recent Labs    12/07/16 1838 01/28/17 1412 08/25/17 1337  BNP 1,321.0* 2,506.0* 2,510.0*    ProBNP (last 3 results) No results for input(s): PROBNP in the last 8760 hours.   D-Dimer No results for input(s): DDIMER in the last 72 hours. Hemoglobin A1C No results for input(s): HGBA1C in the last 72 hours. Fasting Lipid Panel No results for input(s): CHOL, HDL, LDLCALC, TRIG, CHOLHDL, LDLDIRECT in the last 72 hours. Thyroid Function Tests No results for input(s): TSH, T4TOTAL, T3FREE, THYROIDAB in the last 72 hours.  Invalid input(s): FREET3  Other results:   Imaging    Dg Abd Portable 1v  Result Date: 09/06/2017 CLINICAL DATA:  Acute onset of generalized abdominal pain. EXAM: PORTABLE ABDOMEN - 1 VIEW COMPARISON:  Abdominal radiograph performed 08/24/2017  FINDINGS: The visualized bowel gas pattern is unremarkable. Scattered air and stool filled loops of colon are seen; no abnormal dilatation of small bowel loops is seen to suggest small bowel obstruction. No free intra-abdominal air is identified, though evaluation for free air is limited on a single supine view. The visualized osseous structures are within normal limits; the sacroiliac joints are unremarkable in appearance. The visualized lung bases are essentially clear. IMPRESSION: Unremarkable bowel gas pattern; no free intra-abdominal air seen. Moderate amount of stool noted in the colon. Electronically Signed   By: Roanna Raider M.D.   On: 09/06/2017 05:27      Medications:     Scheduled Medications: . [MAR Hold] allopurinol  150 mg Oral Daily  . [MAR Hold] aspirin EC  81 mg Oral Daily  . [MAR Hold] benzonatate  100 mg Oral TID  . [MAR Hold] Chlorhexidine Gluconate Cloth  6 each Topical Daily  . [MAR Hold] digoxin  0.125 mg Oral Daily  . [MAR Hold] feeding supplement (ENSURE ENLIVE)  237 mL Oral TID WC  . [MAR Hold] furosemide  80 mg Intravenous BID  . [MAR Hold] guaiFENesin  600 mg Oral BID  . [MAR Hold] Living Better with Heart Failure Book   Does not apply Once  . magnesium sulfate  40 mEq Other To OR  . [MAR Hold] polyethylene glycol  17 g Oral Daily  . potassium chloride  80 mEq Other To OR  . [MAR Hold] sertraline  25 mg Oral Daily  . [MAR Hold] sodium chloride flush  10-40 mL Intracatheter Q12H  . [MAR Hold] sodium chloride flush  3 mL Intravenous Q12H  . [MAR Hold] sodium chloride flush  3 mL Intravenous Q12H  . [MAR Hold] spironolactone  12.5 mg Oral Daily  . tranexamic acid  15 mg/kg Intravenous To OR  . tranexamic acid  2 mg/kg Intracatheter To OR  . [MAR Hold] traZODone  50 mg Oral QHS  . vancomycin  1,000 mg Other To OR    Infusions: . [MAR Hold] sodium chloride 10 mL/hr at 09/05/17 0800  . [MAR Hold] sodium chloride    . [MAR Hold] sodium chloride    . cefUROXime (ZINACEF)  IV    . cefUROXime (ZINACEF)  IV    . dexmedetomidine    . DOBUTamine    . DOPamine    . epinephrine    . fluconazole (DIFLUCAN) IV    . heparin 30,000 units/NS 1000 mL solution for CELLSAVER    . heparin 950 Units/hr (09/06/17 0600)  . insulin (NOVOLIN-R) infusion    . milrinone 0.375 mcg/kg/min (09/06/17 8119)  . milrinone    . nitroGLYCERIN    . [MAR Hold] norepinephrine (LEVOPHED) Adult infusion 2 mcg/min (09/06/17 0600)  . norepinephrine (LEVOPHED) Adult infusion    . phenylephrine 20mg /234mL NS (0.08mg /ml) infusion    . rifampin (RIFADIN) IVPB    . tranexamic acid (CYKLOKAPRON) infusion ([facility])    . vancomycin    . vasopressin  (PITRESSIN) infusion - *FOR SHOCK*      PRN Medications: [MAR Hold] sodium chloride, [MAR Hold] sodium chloride, [MAR Hold] acetaminophen, [MAR Hold] acetaminophen, [MAR Hold] albuterol, [MAR Hold] ALPRAZolam, [MAR Hold] alum & mag hydroxide-simeth, [MAR Hold] guaiFENesin-dextromethorphan, hemostatic agents, [MAR Hold] methocarbamol, [MAR Hold] nitroGLYCERIN, [MAR Hold] ondansetron (ZOFRAN) IV, [MAR Hold] ondansetron (ZOFRAN) IV, [MAR Hold] oxyCODONE, [MAR Hold] simethicone, [MAR Hold] sodium chloride flush, [MAR Hold] sodium chloride flush, [MAR Hold] sodium chloride flush, sodium chloride irrigation, Surgifoam 1 Gm  with 0.9% sodium chloride (4 ml) topical solution, [MAR Hold] traMADol    Patient Profile   Jonathan Wood is 47 year old with a history of NICM, HTN, ETOH abuse, smoker, asthma, and chronic systolic heart failure.   Assessment/Plan   1. Cardiogenic Shock- NICM had LHC 2018 with normal cors.  - ECHO this admit EF 5-10%. RV moderately reduced.   - Remains on milrinone and NE. IABP placed yesterday - Volume status better - Continue digoxin and spiro  - Smoker- Not a candidate for transplant. Needs 6 months off nicotine.  - For LVAD today  - Blood Type B+  - Palliative Care appreciated.    2. Shock Liver - Resolved with hemodynamic support.   3. AKI II - Resolved with hemodynamic support.   4.  Tobacco Abuse  - Encouraged complete cessation.  - Needs 6 months off for transplant consideration.   5. ETOH  - Encouraged complete cessation.   6. Gout Uric Acid 14.5 treated with 3 days of steroids. Now off. Now on allopurinol - Stable.   7. CAP - Finished course of doxycyline.   8. Anxiety - Continue xanax and sertraline on 4/17 .   CRITICAL CARE Performed by: Arvilla Meres  Total critical care time: 35 minutes  Critical care time was exclusive of separately billable procedures and treating other patients.  Critical care was necessary to treat or prevent  imminent or life-threatening deterioration.  Critical care was time spent personally by me (independent of midlevel providers or residents) on the following activities: development of treatment plan with patient and/or surrogate as well as nursing, discussions with consultants, evaluation of patient's response to treatment, examination of patient, obtaining history from patient or surrogate, ordering and performing treatments and interventions, ordering and review of laboratory studies, ordering and review of radiographic studies, pulse oximetry and re-evaluation of patient's condition.   Length of Stay: 12  Arvilla Meres, MD  09/06/2017, 8:10 AM  Advanced Heart Failure Team Pager [phone] (M-F; 7a - 4p)  Please contact [facility] for night-coverage after hours (4p -7a ) and weekends on [email or web address]

## 2017-09-07 ENCOUNTER — Inpatient Hospital Stay (HOSPITAL_COMMUNITY): Payer: Medicaid Other

## 2017-09-07 DIAGNOSIS — Z95811 Presence of heart assist device: Secondary | ICD-10-CM

## 2017-09-07 LAB — BASIC METABOLIC PANEL
Anion gap: 9 (ref 5–15)
BUN: 17 mg/dL (ref 6–20)
CO2: 21 mmol/L — ABNORMAL LOW (ref 22–32)
Calcium: 8.8 mg/dL — ABNORMAL LOW (ref 8.9–10.3)
Chloride: 104 mmol/L (ref 101–111)
Creatinine, Ser: 1.27 mg/dL — ABNORMAL HIGH (ref 0.61–1.24)
GFR calc Af Amer: >60 mL/min (ref >60)
GFR calc non Af Amer: >60 mL/min (ref >60)
Glucose, Bld: 124 mg/dL — ABNORMAL HIGH (ref 65–99)
Potassium: 4.8 mmol/L (ref 3.5–5.1)
Sodium: 134 mmol/L — ABNORMAL LOW (ref 135–145)

## 2017-09-07 LAB — CBC WITH DIFFERENTIAL/PLATELET
Basophils Absolute: 0 10*3/uL (ref 0.0–0.1)
Basophils Relative: 0 %
Eosinophils Absolute: 0 10*3/uL (ref 0.0–0.7)
Eosinophils Relative: 0 %
HCT: 26.8 % — ABNORMAL LOW (ref 39.0–52.0)
Hemoglobin: 8.6 g/dL — ABNORMAL LOW (ref 13.0–17.0)
Lymphocytes Relative: 8 %
Lymphs Abs: 1.4 10*3/uL (ref 0.7–4.0)
MCH: 28.3 pg (ref 26.0–34.0)
MCHC: 32.1 g/dL (ref 30.0–36.0)
MCV: 88.2 fL (ref 78.0–100.0)
Monocytes Absolute: 2.1 10*3/uL — ABNORMAL HIGH (ref 0.1–1.0)
Monocytes Relative: 12 %
Neutro Abs: 14 10*3/uL — ABNORMAL HIGH (ref 1.7–7.7)
Neutrophils Relative %: 80 %
Platelets: 155 10*3/uL (ref 150–400)
RBC: 3.04 MIL/uL — ABNORMAL LOW (ref 4.22–5.81)
RDW: 13.8 % (ref 11.5–15.5)
WBC: 17.4 10*3/uL — ABNORMAL HIGH (ref 4.0–10.5)

## 2017-09-07 LAB — PREPARE FRESH FROZEN PLASMA
UNIT DIVISION: 0
UNIT DIVISION: 0
UNIT DIVISION: 0
UNIT DIVISION: 0
Unit division: 0
Unit division: 0

## 2017-09-07 LAB — POCT I-STAT 3, ART BLOOD GAS (G3+)
Acid-base deficit: 1 mmol/L (ref 0.0–2.0)
Acid-base deficit: 1 mmol/L (ref 0.0–2.0)
Acid-base deficit: 4 mmol/L — ABNORMAL HIGH (ref 0.0–2.0)
BICARBONATE: 20.8 mmol/L (ref 20.0–28.0)
Bicarbonate: 22.4 mmol/L (ref 20.0–28.0)
Bicarbonate: 24.2 mmol/L (ref 20.0–28.0)
O2 Saturation: 99 %
O2 Saturation: 99 %
O2 Saturation: 98 %
PCO2 ART: 34.5 mmHg (ref 32.0–48.0)
PCO2 ART: 38.1 mmHg (ref 32.0–48.0)
PCO2 ART: 35.6 mmHg (ref 32.0–48.0)
PH ART: 7.408 (ref 7.350–7.450)
PH ART: 7.374 (ref 7.350–7.450)
PO2 ART: 134 mmHg — AB (ref 83.0–108.0)
Patient temperature: 38
TCO2: 23 mmol/L (ref 22–32)
TCO2: 25 mmol/L (ref 22–32)
TCO2: 22 mmol/L (ref 22–32)
pH, Arterial: 7.426 (ref 7.350–7.450)
pO2, Arterial: 135 mmHg — ABNORMAL HIGH (ref 83.0–108.0)
pO2, Arterial: 109 mmHg — ABNORMAL HIGH (ref 83.0–108.0)

## 2017-09-07 LAB — POCT I-STAT, CHEM 8
BUN: 18 mg/dL (ref 6–20)
CHLORIDE: 103 mmol/L (ref 101–111)
Calcium, Ion: 1.23 mmol/L (ref 1.15–1.40)
Creatinine, Ser: 1.1 mg/dL (ref 0.61–1.24)
Glucose, Bld: 129 mg/dL — ABNORMAL HIGH (ref 65–99)
HEMATOCRIT: 27 % — AB (ref 39.0–52.0)
HEMOGLOBIN: 9.2 g/dL — AB (ref 13.0–17.0)
POTASSIUM: 4.7 mmol/L (ref 3.5–5.1)
SODIUM: 135 mmol/L (ref 135–145)
TCO2: 22 mmol/L (ref 22–32)

## 2017-09-07 LAB — BPAM FFP
BLOOD PRODUCT EXPIRATION DATE: 201904282359
Blood Product Expiration Date: 201904282359
Blood Product Expiration Date: 201904282359
Blood Product Expiration Date: 201904282359
Blood Product Expiration Date: 201904282359
Blood Product Expiration Date: 201904282359
ISSUE DATE / TIME: 201904230817
ISSUE DATE / TIME: 201904230817
ISSUE DATE / TIME: 201904230817
ISSUE DATE / TIME: 201904230817
ISSUE DATE / TIME: 201904231226
ISSUE DATE / TIME: 201904231226
UNIT TYPE AND RH: 7300
UNIT TYPE AND RH: 7300
UNIT TYPE AND RH: 7300
Unit Type and Rh: 7300
Unit Type and Rh: 7300
Unit Type and Rh: 7300

## 2017-09-07 LAB — GLUCOSE, CAPILLARY
GLUCOSE-CAPILLARY: 100 mg/dL — AB (ref 65–99)
GLUCOSE-CAPILLARY: 115 mg/dL — AB (ref 65–99)
GLUCOSE-CAPILLARY: 115 mg/dL — AB (ref 65–99)
GLUCOSE-CAPILLARY: 121 mg/dL — AB (ref 65–99)
GLUCOSE-CAPILLARY: 122 mg/dL — AB (ref 65–99)
GLUCOSE-CAPILLARY: 122 mg/dL — AB (ref 65–99)
GLUCOSE-CAPILLARY: 125 mg/dL — AB (ref 65–99)
GLUCOSE-CAPILLARY: 127 mg/dL — AB (ref 65–99)
GLUCOSE-CAPILLARY: 130 mg/dL — AB (ref 65–99)
GLUCOSE-CAPILLARY: 136 mg/dL — AB (ref 65–99)
GLUCOSE-CAPILLARY: 138 mg/dL — AB (ref 65–99)
GLUCOSE-CAPILLARY: 143 mg/dL — AB (ref 65–99)
Glucose-Capillary: 114 mg/dL — ABNORMAL HIGH (ref 65–99)
Glucose-Capillary: 139 mg/dL — ABNORMAL HIGH (ref 65–99)
Glucose-Capillary: 116 mg/dL — ABNORMAL HIGH (ref 65–99)
Glucose-Capillary: 128 mg/dL — ABNORMAL HIGH (ref 65–99)
Glucose-Capillary: 130 mg/dL — ABNORMAL HIGH (ref 65–99)
Glucose-Capillary: 142 mg/dL — ABNORMAL HIGH (ref 65–99)
Glucose-Capillary: 150 mg/dL — ABNORMAL HIGH (ref 65–99)
Glucose-Capillary: 137 mg/dL — ABNORMAL HIGH (ref 65–99)
Glucose-Capillary: 113 mg/dL — ABNORMAL HIGH (ref 65–99)

## 2017-09-07 LAB — CBC
HCT: 25.5 % — ABNORMAL LOW (ref 39.0–52.0)
Hemoglobin: 8.2 g/dL — ABNORMAL LOW (ref 13.0–17.0)
MCH: 28.6 pg (ref 26.0–34.0)
MCHC: 32.2 g/dL (ref 30.0–36.0)
MCV: 88.9 fL (ref 78.0–100.0)
Platelets: 140 10*3/uL — ABNORMAL LOW (ref 150–400)
RBC: 2.87 MIL/uL — ABNORMAL LOW (ref 4.22–5.81)
RDW: 14 % (ref 11.5–15.5)
WBC: 24.9 10*3/uL — ABNORMAL HIGH (ref 4.0–10.5)

## 2017-09-07 LAB — MAGNESIUM
Magnesium: 2.6 mg/dL — ABNORMAL HIGH (ref 1.7–2.4)
Magnesium: 2.6 mg/dL — ABNORMAL HIGH (ref 1.7–2.4)

## 2017-09-07 LAB — COOXEMETRY PANEL
Carboxyhemoglobin: 1.6 % — ABNORMAL HIGH (ref 0.5–1.5)
Methemoglobin: 1.9 % — ABNORMAL HIGH (ref 0.0–1.5)
O2 Saturation: 76.4 %
Total hemoglobin: 8.9 g/dL — ABNORMAL LOW (ref 12.0–16.0)

## 2017-09-07 LAB — COMPREHENSIVE METABOLIC PANEL
ALT: 31 U/L (ref 17–63)
AST: 160 U/L — ABNORMAL HIGH (ref 15–41)
Albumin: 3.4 g/dL — ABNORMAL LOW (ref 3.5–5.0)
Alkaline Phosphatase: 59 U/L (ref 38–126)
Anion gap: 5 (ref 5–15)
BUN: 14 mg/dL (ref 6–20)
CO2: 27 mmol/L (ref 22–32)
Calcium: 8.6 mg/dL — ABNORMAL LOW (ref 8.9–10.3)
Chloride: 106 mmol/L (ref 101–111)
Creatinine, Ser: 1.11 mg/dL (ref 0.61–1.24)
GFR calc Af Amer: >60 mL/min (ref >60)
GFR calc non Af Amer: >60 mL/min (ref >60)
Glucose, Bld: 111 mg/dL — ABNORMAL HIGH (ref 65–99)
Potassium: 5.1 mmol/L (ref 3.5–5.1)
Sodium: 138 mmol/L (ref 135–145)
Total Bilirubin: 3.9 mg/dL — ABNORMAL HIGH (ref 0.3–1.2)
Total Protein: 6.5 g/dL (ref 6.5–8.1)

## 2017-09-07 LAB — BRAIN NATRIURETIC PEPTIDE: B Natriuretic Peptide: 272.7 pg/mL — ABNORMAL HIGH (ref 0.0–100.0)

## 2017-09-07 LAB — PROTIME-INR
INR: 1.38
Prothrombin Time: 16.8 seconds — ABNORMAL HIGH (ref 11.4–15.2)

## 2017-09-07 LAB — PREPARE PLATELET PHERESIS: Unit division: 0

## 2017-09-07 LAB — PHOSPHORUS: Phosphorus: 3.7 mg/dL (ref 2.5–4.6)

## 2017-09-07 LAB — BPAM PLATELET PHERESIS
Blood Product Expiration Date: 201904252359
ISSUE DATE / TIME: 201904231159
Unit Type and Rh: 6200

## 2017-09-07 LAB — LACTATE DEHYDROGENASE: LDH: 420 U/L — ABNORMAL HIGH (ref 98–192)

## 2017-09-07 LAB — CALCIUM, IONIZED: Calcium, Ionized, Serum: 4.4 mg/dL — ABNORMAL LOW (ref 4.5–5.6)

## 2017-09-07 MED ORDER — WARFARIN SODIUM 2.5 MG PO TABS
2.5000 mg | ORAL_TABLET | Freq: Once | ORAL | Status: AC
  Administered 2017-09-07: 2.5 mg via ORAL
  Filled 2017-09-07: qty 1

## 2017-09-07 MED ORDER — INSULIN DETEMIR 100 UNIT/ML ~~LOC~~ SOLN
14.0000 [IU] | Freq: Two times a day (BID) | SUBCUTANEOUS | Status: DC
  Administered 2017-09-08 – 2017-09-19 (×23): 14 [IU] via SUBCUTANEOUS
  Filled 2017-09-07 (×24): qty 0.14

## 2017-09-07 MED ORDER — FUROSEMIDE 10 MG/ML IJ SOLN
40.0000 mg | Freq: Once | INTRAMUSCULAR | Status: AC
  Administered 2017-09-07: 40 mg via INTRAVENOUS

## 2017-09-07 MED ORDER — INSULIN ASPART 100 UNIT/ML ~~LOC~~ SOLN
0.0000 [IU] | SUBCUTANEOUS | Status: DC
  Administered 2017-09-08: 4 [IU] via SUBCUTANEOUS
  Administered 2017-09-08 – 2017-09-09 (×6): 2 [IU] via SUBCUTANEOUS

## 2017-09-07 MED ORDER — ORAL CARE MOUTH RINSE
15.0000 mL | Freq: Two times a day (BID) | OROMUCOSAL | Status: DC
  Administered 2017-09-08 – 2017-09-11 (×7): 15 mL via OROMUCOSAL

## 2017-09-07 MED ORDER — KETOROLAC TROMETHAMINE 15 MG/ML IJ SOLN
15.0000 mg | Freq: Four times a day (QID) | INTRAMUSCULAR | Status: AC
  Administered 2017-09-07 (×2): 15 mg via INTRAVENOUS
  Filled 2017-09-07 (×2): qty 1

## 2017-09-07 MED ORDER — CHLORHEXIDINE GLUCONATE 0.12 % MT SOLN
15.0000 mL | Freq: Two times a day (BID) | OROMUCOSAL | Status: DC
  Administered 2017-09-07 – 2017-09-12 (×6): 15 mL via OROMUCOSAL
  Filled 2017-09-07 (×7): qty 15

## 2017-09-07 MED ORDER — NOREPINEPHRINE BITARTRATE 1 MG/ML IV SOLN
0.0000 ug/min | INTRAVENOUS | Status: DC
  Administered 2017-09-07: 8 ug/min via INTRAVENOUS
  Filled 2017-09-07 (×2): qty 16

## 2017-09-07 MED ORDER — WARFARIN - PHYSICIAN DOSING INPATIENT
Freq: Every day | Status: DC
  Administered 2017-09-07 – 2017-09-08 (×2)
  Administered 2017-09-09 – 2017-09-10 (×2): 1
  Administered 2017-09-13: 19:00:00

## 2017-09-07 MED ORDER — FUROSEMIDE 10 MG/ML IJ SOLN
20.0000 mg | Freq: Two times a day (BID) | INTRAMUSCULAR | Status: DC
  Administered 2017-09-07 (×2): 20 mg via INTRAVENOUS
  Filled 2017-09-07: qty 2

## 2017-09-07 MED ORDER — SODIUM BICARBONATE 8.4 % IV SOLN
50.0000 meq | Freq: Once | INTRAVENOUS | Status: AC
  Administered 2017-09-07: 50 meq via INTRAVENOUS

## 2017-09-07 MED FILL — Sodium Chloride IV Soln 0.9%: INTRAVENOUS | Qty: 250 | Status: AC

## 2017-09-07 MED FILL — Sodium Bicarbonate IV Soln 8.4%: INTRAVENOUS | Qty: 50 | Status: AC

## 2017-09-07 MED FILL — Electrolyte-R (PH 7.4) Solution: INTRAVENOUS | Qty: 3000 | Status: AC

## 2017-09-07 MED FILL — Heparin Sodium (Porcine) Inj 1000 Unit/ML: INTRAMUSCULAR | Qty: 30 | Status: AC

## 2017-09-07 MED FILL — Sodium Chloride IV Soln 0.9%: INTRAVENOUS | Qty: 2000 | Status: AC

## 2017-09-07 MED FILL — Magnesium Sulfate Inj 50%: INTRAMUSCULAR | Qty: 10 | Status: AC

## 2017-09-07 MED FILL — Mannitol IV Soln 20%: INTRAVENOUS | Qty: 500 | Status: AC

## 2017-09-07 MED FILL — Potassium Chloride Inj 2 mEq/ML: INTRAVENOUS | Qty: 40 | Status: AC

## 2017-09-07 MED FILL — Vasopressin Inj 20 Unit/ML: INTRAMUSCULAR | Qty: 2 | Status: AC

## 2017-09-07 NOTE — Progress Notes (Signed)
Nutrition Follow-up  INTERVENTION:   Recommend Juven BID after diet advanced post extubation   NUTRITION DIAGNOSIS:   Increased nutrient needs related to chronic illness as evidenced by estimated needs. Ongoing.   GOAL:   Patient will meet greater than or equal to 90% of their needs Progressing.   MONITOR:   Vent status, Diet advancement  ASSESSMENT:   Patient with PMH significant for polysubstance abuse, CHF with ejection fraction of 15-20%, s/p right/left heart cath, and cardiomegaly. Presented at [facility] with complaints of significant abdominal discomfort and shortness of breath. Pt found to have  acute on chronic CHF and volume overload despite chronically taking 240 mg lasix BID. Transferred to [facility] for cardiac care.   4/23 LVAD placed  Pt remains intubated, per RN will attempt extubation this pm  Pt is - 11 L since admission, weight is down  Per RN appetite has been good. Pt has been ordering take out and having family bring it in. Pt had cook out prior to LVAD. Meal Completion: 75-100% Pt awake and alert on vent.   Medications reviewed and include: colace, dulcolax, lasix 20 mg BID, reglan Labs reviewed  Diet Order:  Diet NPO time specified  EDUCATION NEEDS:   Education needs have been addressed  Skin:  Skin Assessment: Skin Integrity Issues:  Last BM:  4/21  Height:   Ht Readings from Last 1 Encounters:  09/05/17 6\' 2"  (1.88 m)    Weight:   Wt Readings from Last 1 Encounters:  09/07/17 195 lb 12.3 oz (88.8 kg)    Ideal Body Weight:  86.4 kg  BMI:  Body mass index is 25.14 kg/m.  Estimated Nutritional Needs:   Kcal:  2300-2500 kcal  Protein:  115-125 g  Fluid:  >2.3 L/day  Kendell Bane RD, LDN, CNSC [phone] Pager [phone] After Hours Pager

## 2017-09-07 NOTE — Progress Notes (Addendum)
Dr. Donata Clay made aware of CVP sustaining 20-22, PA pressures 40s/mid 20s. MAP 88. Levophed being titrated for MAP. Order received for 40mg  IV Lasix and to increase nitric oxide from 2ppm to 4ppm. Will continue to closely monitor. Thresa Ross RN

## 2017-09-07 NOTE — Progress Notes (Signed)
RT NOTE:  Nitric decreased to 2ppm by Dr. Donata Clay.

## 2017-09-07 NOTE — H&P (Signed)
NAMEMarland Kitchen  ALAND, CHESTNUTT NO.:  [id number]  MEDICAL RECORD NO.:  [id number]  LOCATION:                                 FACILITY:  PHYSICIAN:  Kerin Perna, M.D.  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  PREOPERATIVE DIAGNOSES:  Nonischemic cardiomyopathy, acute systolic heart failure with cardiogenic shock and preoperative balloon pump and inotropic support.  POSTOPERATIVE DIAGNOSES:  Nonischemic cardiomyopathy, acute systolic heart failure with cardiogenic shock and preoperative balloon pump and inotropic support.  SURGEON:  Kerin Perna, M.D.  ASSISTANT:  Doree Fudge, PA-C.  ANESTHESIA:  General, with Dr. Kipp Brood.  CLINICAL NOTE:  The patient is a 47 year old African American male, who presented to the hospital with fatigue, edema, dizziness, and cardiogenic shock.  He was supported by the Advanced Heart Failure team and required inotropic and mechanical support.  He was felt to be a potential candidate for ventricular assist device, destination therapy application and he was evaluated by the mechanical circulatory support team.  LVAD implantation was the recommendation of the team and I saw the patient in consultation and discussed the procedure of HeartMate 3 implantation in detail with the patient and his family.  I discussed the major expected benefits of the LVAD, the alternatives to LVAD therapy, and the potential risks as well as the expected postoperative hospital recovery.  He understood the risks to include stroke, bleeding, blood transfusion, RV failure requiring RVAD, postoperative infection, postoperative organ failure, and death.  He demonstrated he understood these issues and agreed to proceed with surgery under what I felt was an informed consent.  OPERATIVE  FINDINGS: 1. Moderate and almost severe RV dysfunction noted upon opening the     chest, but this improved with implantation of the HeartMate 3. 2. No LV apical thrombus noted, but there was some dense trabeculae     which were excised. 3. No packed cell transfusions required for the surgery.  DESCRIPTION OF PROCEDURE:  The patient was brought to the operating room and placed supine on the operating table.  General anesthesia was induced under invasive hemodynamic monitoring.  A transesophageal echo probe was placed by the Anesthesia team.  The chest, abdomen, and legs were prepped with Betadine and draped as a sterile field.  A proper time- out was performed.  A sternal incision was made.  A small midline abdominal incision was made to the left of the umbilicus as well as a small incision at the left costal margin for exit of the power cord of the LVAD.  The sternal retractor was placed and pericardium was opened.  The heart was very severely dilated.  RV function was significantly impaired.  LV function was almost a totally akinetic.  Heparin was administered. Pursestrings were placed in ascending aorta and right atrium and the patient was cannulated and placed on bypass.  The pericardium was insufflated with CO2.  The preoperative TEE showed no evidence of PFO or TR or aortic insufficiency.  The LV apex was identified and incision was made with 11 blade  scalpel.  This was dilated with a tonsil clamp, and then, a balloon with a cutting blade to core the LV apex was inserted and the LV apex was cored out in a single plug of muscle.  The RA was inspected. There was no thrombus in the LV apex, however, the trabeculae were very sick and these were trimmed back.  Next, twelve 2-0 Ethibond pledgeted sutures were placed around the circumference of the apical opening.  They were then placed through the sewing ring and the sutures were all tied sequentially.  This was reinforced with a  light layer of Biologic adhesive - Coseal.  Next, the HeartMate 3 pump was brought to the field and the volume was left in the patient to remove any air from the LV chamber.  The HeartMate 3 inflow cannula was inserted into the LV through the sewing ring and the engaging mechanism was locked.  The heart was gently lowered into the pericardium and the pocket that had been previously created.  The driveline was brought to the lower chest and the abdominal incisions and exited the left left subcostal area.  A clamp was placed on the outflow graft.  Next, the ascending aorta was inspected and a partial occluding clamp was placed.  An arteriotomy was made for the outflow graft, which was divided and trimmed to the appropriate length and orientation.  The graft was then sewn end-to-side to the ascending aorta with a side- biting clamp in place using running 4-0 Prolene.  The clamp was removed and there was good hemostasis of the suture line.  Air was evacuated from the graft with a 20-gauge needle between the aorta and the vascular clamp on the outflow graft.  Next, the patient was prepared for separation from cardiopulmonary bypass with the LVAD in place.  Temporary pacing wires were applied. The lungs were expanded.  The patient was placed on inhaled nitric oxide.  Medium dose milrinone and low-dose norepinephrine and epinephrine were started.  Today, the patient was then transitioned gradually off cardiopulmonary bypass to LVAD support with the ED speeds increasing from 4500 up to 5400 RPMs.  This produced a flow of 4.7 L/min with a pulsatility index of 3.3.  Echo showed the inflow cannula to be a good orientation and the septum was fairly flat.  There was very little ejection from the native heart.  The patient was decannulated.  Heparin was reversed with protamine.  The patient remained stable.  There was diffuse bleeding from the chest wall and sternum, and the patient received some  FFP and platelets to improve coagulation function.  Drains were placed in the left pleural space, the anterior mediastinum and into the LVAD pocket space and brought out through separate incisions.  The superior pericardial fat was closed over the aorta.  The cannulation sites were checked and found to be hemostatic.  The sternum was then closed.  The pectoralis fascia was closed with a running #1 Vicryl.  The subcutaneous and skin layers were closed in running Vicryl.  The abdominal incision was closed in layers using Vicryl, and the power cord exit site was closed using a subcutaneous 3-0 Ethibond and 2 nylon sutures in the skin.  Sterile dressings were applied.  A chest x-ray taken in the room showed chest tubes and Swan and endotracheal tube to be in good position and the LVAD in good orientation.  The patient was then transported back to the ICU.  Total cardiopulmonary bypass time was 123 minutes.  Kerin Perna, M.D.     PV/MEDQ  D:  09/06/2017  T:  09/07/2017  Job:  [id number]

## 2017-09-07 NOTE — Progress Notes (Signed)
Jonathan Wood has been discussed with the VAD Medical Review board on 09/05/17 . The team feels as if the patient is a good candidate for Destination LVAD therapy. The patient meets criteria for a LVAD implant as listed below:  1)  Has failed to respond to optimal medical management (including beta-blockers and ACE inhibitors if tolerated) for at least 45 of the last 60 days, or have been balloon dependent for 7 days, or IV inotrope dependent for 14 days; and,       *On Inotropes Milrinone started 08/26/17  2)  Has a left ventricular ejection fraction (LVEF) < 25% and, have demonstrated functional limitation with a peak oxygen consumption of <14 ml/kg/min unless balloon pump or inotrope dependent or physically unable to perform the test.         *EF 5 - 10% by echo 08/26/17       *IABP insertion: 09/05/17            *CPX - not done, pt on Milrinone  3)  Social work and palliative care evaluations demonstrate appropriate support system in place for discharge to home with a VAD and that end of life discussions have taken place. Both services have expressed no concern regarding patient's candidacy.          *Social work consult 08/31/17: Jonathan Wood, LSW        *Palliative Care Consult 06/27/92: Jonathan Sill, NP  4)  Primary caretaker identified that can be taught along with the patient how to manage        the VAD equipment.        *Name: Jonathan Wood  5)  Deemed appropriate by our financial coordinator: 09/05/17 Medicaid eligible        Prior approval: N/A  6)  VAD Coordinator, Jonathan Wood, has met with patient and caregiver, shown them the VAD equipment and discussed with the patient and caregiver about lifestyle changes necessary for success on mechanical circulatory device.        *Jonathan Wood met with patient and primary caregiver Jonathan Wood) on 08/30/17       *Consent for VAD Evaluation/Caregiver Agreement/HIPPA Release/Photo Release signed on         08/30/17  7)  Patient has  been discussed with [place] (JonathanAdam Wood) by Jonathan Wood and felt to be an appropriate candidate due to current smoking history.    8)  Intermacs profile: 2  INTERMACS 1: Critical cardiogenic shock describes a patient who is "crashing and burning", in which a patient has life-threatening hypotension and rapidly escalating inotropic pressor support, with critical organ hypoperfusion often confirmed by worsening acidosis and lactate levels.  INTERMACS 2: Progressive decline describes a patient who has been demonstrated "dependent" on inotropic support but nonetheless shows signs of continuing deterioration in nutrition, renal function, fluid retention, or other major status indicator. Patient profile 2 can also describe a patient with refractory volume overload, perhaps with evidence of impaired perfusion, in whom inotropic infusions cannot be maintained due to tachyarrhythmias, clinical ischemia, or other intolerance.  INTERMACS 3: Stable but inotrope dependent describes a patient who is clinically stable on mild-moderate doses of intravenous inotropes (or has a temporary circulatory support device) after repeated documentation of failure to wean without symptomatic hypotension, worsening symptoms, or progressive organ dysfunction (usually renal). It is critical to monitor nutrition, renal function, fluid balance, and overall status carefully in order to distinguish between a  patient who is truly stable at Patient Profile 3 and a patient who  has unappreciated decline rendering them Patient Profile 2. This patient may be either at home or in the hospital.   INTERMACS 4: Resting symptoms describes a patient who is at home on oral therapy but frequently has symptoms of congestion at rest or with activities of daily living (ADL). He or she may have orthopnea, shortness of breath during ADL such as dressing or bathing, gastrointestinal symptoms (abdominal discomfort, nausea, poor appetite),  disabling ascites or severe lower extremity edema. This patient should be carefully considered for more intensive management and surveillance programs, which may in some cases, reveal poor compliance that would compromise outcomes with any therapy.  .  INTERMACS 5: Exertion Intolerant describes a patient who is comfortable at rest but unable to engage in any activity, living predominantly within the house or housebound. This patient has no congestive symptoms, but may have chronically elevated volume status, frequently with renal dysfunction, and may be characterized as exercise intolerant.   INTERMACS 6: Exertion Limited also describes a patient who is comfortable at rest without evidence of fluid overload, but who is able to do some mild activity. Activities of daily living are comfortable and minor activities outside the home such as visiting friends or going to a restaurant can be performed, but fatigue results within a few minutes of any meaningful physical exertion. This patient has occasional episodes of worsening symptoms and is likely to have had a hospitalization for heart failure within the past year.   INTERMACS 7: Advanced NYHA Class 3 describes a patient who is clinically stable with a reasonable level of comfortable activity, despite history of previous decompensation that is not recent. This patient is usually able to walk more than a block. Any decompensation requiring intravenous diuretics or hospitalization within the previous month should make this person a Patient Profile 6 or lower.    9)  NYHA Class: IV

## 2017-09-07 NOTE — Progress Notes (Addendum)
HeartMate 3 Rounding Note  Subjective:    47 year old male withAA nonischemic cardiomyopathy, admitted with recurrent heart failure and cardiogenic shock with preoperative double inotropes and balloon pump.  Preoperative moderate to severe RV dysfunction, no PFO on preoperative echo bubble study   implantation of HeartMate 3 left ventricular assist device April 23.  Preoperative intra-aortic balloon pump removed.  Postop HeartMate 3 implantation with stable hemodynamics and good VAD flow parameters and  pulsatility index..  RV function postop supported with low-dose epinephrine and norepinephrine.  Maintaining sinus rhythm.  Overnight inhaled nitric oxide has been weaned In preparation for extubation today.  Neuro intact.  Minimal chest tube output, INR 1.4, will start oral Coumadin this p.m.   LVAD INTERROGATION:  HeartMate 3 LVAD:  Flow 5.0 liters/min, speed 5400 RPM, power 4.2, PI 2.4.  Controller intact.   Objective:    Vital Signs:   Temp:  [96.4 F (35.8 C)-102 F (38.9 C)] 99.3 F (37.4 C) (04/24 0800) Pulse Rate:  [90-106] 90 (04/24 0808) Resp:  [12-32] 15 (04/24 0808) BP: (82)/(71) 82/71 (04/23 1450) SpO2:  [93 %-100 %] 100 % (04/24 1027) Arterial Line BP: (82-112)/(71-96) 86/76 (04/23 1745) FiO2 (%):  [50 %] 50 % (04/24 0808) Weight:  [195 lb 12.3 oz (88.8 kg)] 195 lb 12.3 oz (88.8 kg) (04/24 0540) Last BM Date: 09/04/17 Mean arterial Pressure 70-80 mmHg mean arterial pressure  Intake/Output:   Intake/Output Summary (Last 24 hours) at 09/07/2017 0948 Last data filed at 09/07/2017 0900 Gross per 24 hour  Intake 5549.19 ml  Output 3935 ml  Net 1614.19 ml     Physical Exam: General:  Well appearing. No resp difficulty.  On ventilator HEENT: normal Neck: supple. JVP . Carotids 2+ bilat; no bruits. No lymphadenopathy or thryomegaly appreciated. Cor: Mechanical heart sounds with LVAD hum present. Lungs: clear Abdomen: soft, nontender, nondistended. No  hepatosplenomegaly. No bruits or masses. Good bowel sounds. Extremities: no cyanosis, clubbing, rash, edema Neuro: alert & orientedx3, cranial nerves grossly intact. moves all 4 extremities w/o difficulty. Affect pleasant  Telemetry: Sinus rhythm  Labs: Basic Metabolic Panel: Recent Labs  Lab 09/04/17 0448 09/05/17 0359 09/06/17 0430  09/06/17 1128 09/06/17 1244  09/06/17 1329 09/06/17 1500 09/06/17 1502 09/06/17 2103 09/06/17 2114 09/07/17 0411  NA 135 133* 133*   < > 136 138   < > 140 140 137  --  136 138  K 4.5 4.1 4.1   < > 4.1 4.2   < > 4.1 4.2 4.4  --  5.1 5.1  CL 105 103 99*   < > 101 100*  --   --   --  105  --  103 106  CO2 _0 --   --   --   --   --   --  25  --   --  27  GLUCOSE 147* 108* 118*   < > 161* 148*  --   --  106* 106*  --  114* 111*  BUN 29* 27* 18   < > 16 20  --   --   --  14  --  14 14  CREATININE 1.25* 1.06 0.98   < > 0.70 0.80  --   --   --  0.87 0.90 0.80 1.11  CALCIUM 8.8* 9.0 8.9  --   --   --   --   --   --  8.2*  --   --  8.6*  MG 1.8 2.2  2.1  --   --   --   --   --   --  1.9 3.2*  --  2.6*  PHOS  --   --   --   --   --   --   --   --   --   --   --   --  3.7   < > = values in this interval not displayed.    Liver Function Tests: Recent Labs  Lab 09/03/17 0420 09/04/17 0448 09/05/17 0359 09/06/17 0430 09/07/17 0411  AST _0 160*  ALT 50 42 39 36 31  ALKPHOS 85 79 82 76 59  BILITOT 0.9 1.3* 0.9 1.3* 3.9*  PROT 6.9 7.0 6.9 7.0 6.5  ALBUMIN 2.8* 2.9* 2.9* 3.0* 3.4*   No results for input(s): LIPASE, AMYLASE in the last 168 hours. No results for input(s): AMMONIA in the last 168 hours.  CBC: Recent Labs  Lab 09/05/17 0359 09/06/17 0430  09/06/17 1125  09/06/17 1500 09/06/17 1502 09/06/17 2103 09/06/17 2114 09/07/17 0411  WBC 9.3 9.7  --   --   --   --  18.3* 13.0*  --  17.4*  NEUTROABS  --   --   --   --   --   --   --   --   --  14.0*  HGB 11.2* 11.6*   < > 7.6*   < > 9.2* 8.4* 8.5* 9.2* 8.6*  HCT 34.7* 35.8*    < > 23.3*   < > 27.0* 25.8* 26.4* 27.0* 26.8*  MCV 87.8 89.3  --   --   --   --  88.7 88.3  --  88.2  PLT 221 202  --  134*  --   --  137* 138*  --  155   < > = values in this interval not displayed.    INR: Recent Labs  Lab 09/06/17 1502 09/07/17 0411  INR 1.38 1.38    Other results:  EKG:   Imaging: Dg Chest 1 View  Result Date: 09/06/2017 CLINICAL DATA:  LVAD insertion EXAM: CHEST  1 VIEW COMPARISON:  09/04/2017 FINDINGS: Postsurgical changes are now seen. Right jugular central line is noted in the distal superior vena cava. Swan-Ganz catheter is noted in the main pulmonary artery. Endotracheal tube is noted in satisfactory position. Mediastinal drain and pericardial drain as well as a left thoracostomy drain are also noted in satisfactory position. No pneumothorax is noted. Intra-aortic balloon pump is noted with the tip just below the aortic knob. Lungs are well aerated bilaterally with some mild vascular crowding centrally. Minimal left basilar atelectasis is noted. LVAD is noted in place. IMPRESSION: Tubes and lines as described above. No pneumothorax is noted. Mild vascular crowding is seen with left basilar atelectasis. Electronically Signed   By: Inez Catalina M.D.   On: 09/06/2017 14:36   Dg Abd Portable 1v  Result Date: 09/06/2017 CLINICAL DATA:  Acute onset of generalized abdominal pain. EXAM: PORTABLE ABDOMEN - 1 VIEW COMPARISON:  Abdominal radiograph performed 08/24/2017 FINDINGS: The visualized bowel gas pattern is unremarkable. Scattered air and stool filled loops of colon are seen; no abnormal dilatation of small bowel loops is seen to suggest small bowel obstruction. No free intra-abdominal air is identified, though evaluation for free air is limited on a single supine view. The visualized osseous structures are within normal limits; the sacroiliac joints are unremarkable in appearance. The visualized lung bases are essentially  clear. IMPRESSION: Unremarkable bowel gas  pattern; no free intra-abdominal air seen. Moderate amount of stool noted in the colon. Electronically Signed   By: Garald Balding M.D.   On: 09/06/2017 05:27      Medications:     Scheduled Medications: . acetaminophen  1,000 mg Oral Q6H   Or  . acetaminophen (TYLENOL) oral liquid 160 mg/5 mL  1,000 mg Per Tube Q6H  . aspirin EC  325 mg Oral Daily   Or  . aspirin  324 mg Per Tube Daily   Or  . aspirin  300 mg Rectal Daily  . bisacodyl  10 mg Oral Daily   Or  . bisacodyl  10 mg Rectal Daily  . chlorhexidine gluconate (MEDLINE KIT)  15 mL Mouth Rinse BID  . Chlorhexidine Gluconate Cloth  6 each Topical Daily  . docusate sodium  200 mg Oral Daily  . furosemide  20 mg Intravenous BID  . insulin regular  0-10 Units Intravenous TID WC  . mouth rinse  15 mL Mouth Rinse 10 times per day  . metoCLOPramide (REGLAN) injection  10 mg Intravenous Q6H  . [START ON 09/08/2017] pantoprazole  40 mg Oral Daily  . rifampin  600 mg Oral Once  . sodium chloride flush  10-40 mL Intracatheter Q12H  . sodium chloride flush  3 mL Intravenous Q12H  . warfarin  2.5 mg Oral ONCE-1800  . Warfarin - Physician Dosing Inpatient   Does not apply q1800     Infusions: . sodium chloride 20 mL/hr at 09/06/17 1500  . sodium chloride    . sodium chloride 10 mL/hr at 09/06/17 1500  . albumin human    . cefUROXime (ZINACEF)  IV Stopped (09/07/17 0005)  . dexmedetomidine (PRECEDEX) IV infusion Stopped (09/07/17 0920)  . EPINEPHrine 4 mg in dextrose 5% 250 mL infusion (16 mcg/mL) 2 mcg/min (09/07/17 0600)  . insulin (NOVOLIN-R) infusion 3.5 Units/hr (09/07/17 0900)  . lactated ringers    . lactated ringers Stopped (09/06/17 1623)  . lactated ringers 20 mL (09/07/17 0227)  . milrinone 0.375 mcg/kg/min (09/07/17 0600)  . norepinephrine (LEVOPHED) Adult infusion 4 mcg/min (09/07/17 0625)  . vancomycin Stopped (09/06/17 2330)     PRN Medications:  sodium chloride, albumin human, ipratropium-albuterol,  lactated ringers, midazolam, morphine injection, ondansetron (ZOFRAN) IV, oxyCODONE, sodium chloride flush, sodium chloride flush, traMADol   Assessment:  Nonischemic cardiomyopathy, cardiogenic shock Implantation of HeartMate 3 left ventricular assist device April 23 Stable LVAD flow parameters and hemodynamics on low-dose epinephrine and norepinephrine   Plan/Discussion:   Plan extubation and dangle patient on side of bed Leave PA catheter in today, continue diuresis with IV Lasix dosing and start oral Coumadin Continue inhaled nitric oxide through nasal cannula 4 ppm   I reviewed the LVAD parameters from today, and compared the results to the patient's prior recorded data.  No programming changes were made.  The LVAD is functioning within specified parameters.  The patient performs LVAD self-test daily.  LVAD interrogation was negative for any significant power changes, alarms or PI events/speed drops.  LVAD equipment check completed and is in good working order.  Back-up equipment present.   LVAD education done on emergency procedures and precautions and reviewed exit site care.  Length of Stay: Chapman III 09/07/2017, 9:48 AM

## 2017-09-07 NOTE — Progress Notes (Signed)
RT attempted to wean patient per MD order. Patient had increased WOB and increased RR. RN to make MD aware. RT will continue to monitor.

## 2017-09-07 NOTE — Progress Notes (Signed)
CT surgery p.m. Rounds  Extubated with adequate ABG Mean arterial pressure 75-80 VAD flow 4.5 L/min, CVP less than 10 PM labs satisfactory, white count elevated from stress reaction Weaning inhaled nitric oxide Hopefully DC Swan and mobilize out of bed tomorrow

## 2017-09-07 NOTE — Progress Notes (Signed)
CSW visited patient at bedside. Patient resting comfortably after extubation. RN reports patient's mother called in earlier and plans to visit this evening. CSW will follow up with patient and family tomorrow. Lasandra Beech, LCSW, CCSW-MCS [phone]

## 2017-09-07 NOTE — Progress Notes (Signed)
OT Cancellation Note  Patient Details Name: Jonathan Wood MRN: [id number] DOB: June 08, 1970   Cancelled Treatment:    Reason Eval/Treat Not Completed: Other (comment): Order received for OT evaluation to begin POD#2. Will check back next date to initiate OT evaluation.  Doristine Section, MS OTR/L  Pager: [phone]   Jonathan Wood 09/07/2017, 7:12 AM

## 2017-09-07 NOTE — Progress Notes (Addendum)
Advanced Heart Failure/VAD Rounding Note  PCP-Cardiologist: No primary care provider on file.   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    Coox 76.4% this am on milrinone 0.375 mcg/kg/min, NE 4, and Epi 2.   Awake on vent. Following commands. Good urine output with IV lasix  Dressing C/D/I. Marland Kitchen O2 50%, PEEP 5, NO 5.5 ppm  Swan numbers (Personally reviewed) PAP 25/10 (17) CVP 8-9 cm CO 5.64 CI 2.66  LVAD Interrogation HM 3: Speed: 5400 Flow: 4.9 PI: 2.9 Power: 4.0. Several PI events associated with pump start.   Objective:   Weight Range: 195 lb 12.3 oz (88.8 kg) Body mass index is 25.14 kg/m.   Vital Signs:   Temp:  [96.4 F (35.8 C)-102 F (38.9 C)] 99.3 F (37.4 C) (04/24 0800) Pulse Rate:  [90-106] 90 (04/24 0808) Resp:  [12-32] 15 (04/24 0808) BP: (82)/(71) 82/71 (04/23 1450) SpO2:  [93 %-100 %] 100 % (04/24 0808) Arterial Line BP: (82-112)/(71-96) 86/76 (04/23 1745) FiO2 (%):  [50 %] 50 % (04/24 0808) Weight:  [195 lb 12.3 oz (88.8 kg)] 195 lb 12.3 oz (88.8 kg) (04/24 0540) Last BM Date: 09/04/17  Weight change: Filed Weights   09/04/17 0500 09/05/17 0400 09/07/17 0540  Weight: 190 lb 11.2 oz (86.5 kg) 189 lb 4.8 oz (85.9 kg) 195 lb 12.3 oz (88.8 kg)    Intake/Output:   Intake/Output Summary (Last 24 hours) at 09/07/2017 0847 Last data filed at 09/07/2017 0800 Gross per 24 hour  Intake 5452.19 ml  Output 3912 ml  Net 1540.19 ml      Physical Exam   GENERAL: Intubated. Awake. HEENT: +ETT anicteric  NECK: Supple, JVP 8-9 cm. Carotids OK.  CARDIAC:  Sternal dressing in place Mechanical heart sounds with LVAD hum present.  LUNGS:  CTAB, normal effort.  ABDOMEN:  NT, = distended, no HSM. No bruits or masses. quiet  LVAD exit site: Dressing dry and intact. No erythema or drainage. Stabilization device present and accurately applied. Driveline dressing changed daily per sterile technique. EXTREMITIES:  Warm and dry. No cyanosis, clubbing, rash, or 1-  edema.  NEUROLOGIC: Intubated. Alert to person. Follows commands. Cranial nerves grossly intact. Moves all 4 extremities w/o difficulty. Affect flat but appropriate.   Telemetry   Sinus 80-90s, personally reviewed.   EKG    No new tracings.    Labs    CBC Recent Labs    09/06/17 2103 09/06/17 2114 09/07/17 0411  WBC 13.0*  --  17.4*  NEUTROABS  --   --  14.0*  HGB 8.5* 9.2* 8.6*  HCT 26.4* 27.0* 26.8*  MCV 88.3  --  88.2  PLT 138*  --  096   Basic Metabolic Panel Recent Labs    09/06/17 1502 09/06/17 2103 09/06/17 2114 09/07/17 0411  NA 137  --  136 138  K 4.4  --  5.1 5.1  CL 105  --  103 106  CO2 25  --   --  27  GLUCOSE 106*  --  114* 111*  BUN 14  --  14 14  CREATININE 0.87 0.90 0.80 1.11  CALCIUM 8.2*  --   --  8.6*  MG 1.9 3.2*  --  2.6*  PHOS  --   --   --  3.7   Liver Function Tests Recent Labs    09/06/17 0430 09/07/17 0411  AST 24 160*  ALT 36 31  ALKPHOS 76 59  BILITOT 1.3* 3.9*  PROT 7.0 6.5  ALBUMIN 3.0* 3.4*   No results for input(s): LIPASE, AMYLASE in the last 72 hours. Cardiac Enzymes No results for input(s): CKTOTAL, CKMB, CKMBINDEX, TROPONINI in the last 72 hours.  BNP: BNP (last 3 results) Recent Labs    01/28/17 1412 08/25/17 1337 09/07/17 0411  BNP 2,506.0* 2,510.0* 272.7*    ProBNP (last 3 results) No results for input(s): PROBNP in the last 8760 hours.   D-Dimer No results for input(s): DDIMER in the last 72 hours. Hemoglobin A1C No results for input(s): HGBA1C in the last 72 hours. Fasting Lipid Panel No results for input(s): CHOL, HDL, LDLCALC, TRIG, CHOLHDL, LDLDIRECT in the last 72 hours. Thyroid Function Tests No results for input(s): TSH, T4TOTAL, T3FREE, THYROIDAB in the last 72 hours.  Invalid input(s): FREET3  Other results:   Imaging    Dg Chest 1 View  Result Date: 09/06/2017 CLINICAL DATA:  LVAD insertion EXAM: CHEST  1 VIEW COMPARISON:  09/04/2017 FINDINGS: Postsurgical changes are now  seen. Right jugular central line is noted in the distal superior vena cava. Swan-Ganz catheter is noted in the main pulmonary artery. Endotracheal tube is noted in satisfactory position. Mediastinal drain and pericardial drain as well as a left thoracostomy drain are also noted in satisfactory position. No pneumothorax is noted. Intra-aortic balloon pump is noted with the tip just below the aortic knob. Lungs are well aerated bilaterally with some mild vascular crowding centrally. Minimal left basilar atelectasis is noted. LVAD is noted in place. IMPRESSION: Tubes and lines as described above. No pneumothorax is noted. Mild vascular crowding is seen with left basilar atelectasis. Electronically Signed   By: Inez Catalina M.D.   On: 09/06/2017 14:36     Medications:     Scheduled Medications: . acetaminophen  1,000 mg Oral Q6H   Or  . acetaminophen (TYLENOL) oral liquid 160 mg/5 mL  1,000 mg Per Tube Q6H  . aspirin EC  325 mg Oral Daily   Or  . aspirin  324 mg Per Tube Daily   Or  . aspirin  300 mg Rectal Daily  . bisacodyl  10 mg Oral Daily   Or  . bisacodyl  10 mg Rectal Daily  . chlorhexidine gluconate (MEDLINE KIT)  15 mL Mouth Rinse BID  . Chlorhexidine Gluconate Cloth  6 each Topical Daily  . docusate sodium  200 mg Oral Daily  . furosemide  20 mg Intravenous BID  . insulin regular  0-10 Units Intravenous TID WC  . mouth rinse  15 mL Mouth Rinse 10 times per day  . metoCLOPramide (REGLAN) injection  10 mg Intravenous Q6H  . [START ON 09/08/2017] pantoprazole  40 mg Oral Daily  . rifampin  600 mg Oral Once  . sodium chloride flush  10-40 mL Intracatheter Q12H  . sodium chloride flush  3 mL Intravenous Q12H    Infusions: . sodium chloride 20 mL/hr at 09/06/17 1500  . sodium chloride    . sodium chloride 10 mL/hr at 09/06/17 1500  . albumin human    . cefUROXime (ZINACEF)  IV Stopped (09/07/17 0005)  . dexmedetomidine (PRECEDEX) IV infusion 0.5 mcg/kg/hr (09/07/17 0800)  .  EPINEPHrine 4 mg in dextrose 5% 250 mL infusion (16 mcg/mL) 2 mcg/min (09/07/17 0600)  . insulin (NOVOLIN-R) infusion 4.1 Units/hr (09/07/17 0800)  . lactated ringers    . lactated ringers Stopped (09/06/17 1623)  . lactated ringers 20 mL (09/07/17 0227)  . milrinone 0.375 mcg/kg/min (09/07/17 0600)  .  norepinephrine (LEVOPHED) Adult infusion 4 mcg/min (09/07/17 0625)  . vancomycin Stopped (09/06/17 2330)    PRN Medications: sodium chloride, albumin human, ipratropium-albuterol, lactated ringers, midazolam, morphine injection, ondansetron (ZOFRAN) IV, oxyCODONE, sodium chloride flush, sodium chloride flush, traMADol    Patient Profile   Jonathan Wood is 47 year old with a history of NICM, HTN, ETOH abuse, smoker, asthma, and chronic systolic heart failure.   Assessment/Plan   1. Cardiogenic Shock- NICM had LHC 2018 with normal cors.  - ECHO this admit EF 5-10%. RV moderately reduced.   - s/p HM 3 LVAD 09/06/17  - Coox 76.4% this am on milrinone 0.375 mcg/kg/min, NE 4, and Epi 2.  - Volume status mildly elevated. CVP 8-9 - Continue low dose IV lasix.  - Hopefully to Extubate today.  - Smoker- Not a candidate for transplant. Needs 6 months off nicotine.  - Blood Type B+   2. Shock Liver - Resolved with hemodynamic support.   3. AKI II - Resolved with hemodynamic support.  - Continue to follow with diuresis.   4.  Tobacco Abuse  - Encouraged complete cessation.  - Needs 6 months off for transplant consideration.   5. ETOH  - Encouraged complete cessation.   6. Gout Uric Acid 14.5 treated with 3 days of steroids. Now off. Now on allopurinol - Stable.    7. CAP - Finished course of doxycyline. Improved.   8. Anxiety - Continue xanax and sertraline on 4/17.  - Stable.   I reviewed the LVAD parameters from today, and compared the results to the patient's prior recorded data.  No programming changes were made.  The LVAD is functioning within specified parameters.  The  patient performs LVAD self-test daily.  LVAD interrogation was negative for any significant power changes, alarms or PI events/speed drops.  LVAD equipment check completed and is in good working order.  Back-up equipment present.   LVAD education done on emergency procedures and precautions and reviewed exit site care.  Length of Stay: [address]  Jonathan Wood  09/07/2017, 8:47 AM  Advanced Heart Failure Team Pager [phone] (M-F; 7a - 4p)  Please contact [facility] for night-coverage after hours (4p -7a ) and weekends on [email or web address]  Agree with above.   POD #1 VAD placement. Hemodynamics look good on low dose inotropes. No evidence of RV failure. Volume status up - getting IV lasix. Minimal drainage from tubes. Rhythm stable   On exam Intubated awake following commands RIJ swan Cor sternal dressing. LVAD hum  Lungs coarse Ab mildly distended. Quite  driveline stie ok  Ext 1+ edema   Stable POD#1 VAD. Hopefully can extubate today. Wean pressors slowly as tolerated. Diurese gently. Rhythm stable. VAD interrogated personally. Parameters stable.  CRITICAL CARE Performed by: [name]  Total critical care time: 35 minutes  Critical care time was exclusive of separately billable procedures and treating other patients.  Critical care was necessary to treat or prevent imminent or life-threatening deterioration.  Critical care was time spent personally by me (independent of midlevel providers or residents) on the following activities: development of treatment plan with patient and/or surrogate as well as nursing, discussions with consultants, evaluation of patient's response to treatment, examination of patient, obtaining history from patient or surrogate, ordering and performing treatments and interventions, ordering and review of laboratory studies, ordering and review of radiographic studies, pulse oximetry and re-evaluation of patient's condition.  [name],  MD  10:33 PM

## 2017-09-07 NOTE — Progress Notes (Signed)
LVAD Coordinator Rounding Note:  Admitted 08/25/17 due to CHF. Dr. [name] consulted for Advanced Heart Failure. VAD evaluation completed.   HM III LVAD implanted on 09/07/17 by 09/06/17 under Destination Therapy criteria due to current smoking history.  Vital signs: Temp:  99.9 HR: 90 A-line MAP: 82/71 (75) Doppler: 68 O2 Sat:  100% vented Wt: 189>195 lbs    LVAD interrogation reveals:  Speed: 5400 Flow: 4.9 Power:  4.1 PI: 2.9 Alarms: none Events: 30 PI events (between 3 - 5 pm yesterday) Hematocrit:  27 Fixed speed: 5400 Low speed limit: 5100  Drive Line: left abdominal drive line site with gauze dressing and anchor intact. Will be changed daily by VAD Coordinator or Nurse Davonna Belling.  Labs:  LDH trend: 420  INR trend: 1.38  Anticoagulation Plan: -INR Goal: 2.0 - 2.5 -ASA Dose: 325 mg daily until therapeutic INR  Blood Products:  Intra op: - 1 uint plts - 2 FFP - 250 cell saver  Device: N/A  NO: 5 ppm  Gtts: - Levo 4 mcg/min - Milrinone .375 mcg/kg/min  VAD Education: pt remains intubated, no family at bedside. Unable to initiate teaching.   Adverse Events on VAD: -none  Plan/Recommendations:   1. Probable extubation today. 2. Daily dressing change per VAD coordinator. Please obtain daily dressing kit and aquacel silver. 3. Please call VAD pager if any VAD equipment or drive line issues.  Zada Girt RN, VAD Coordinator 24/7 VAD pager: [phone]

## 2017-09-07 NOTE — Progress Notes (Signed)
Anesthesiology Follow:  Awake and alert, in good spirits, neuro intact. Extubated at 15:30 today on POD #1. IABP removed today.  VS: T- 36.6 HR-101 (SR) MAP-75 RR 25 O2 Sat 100% on 4L  PA 30/16 CVP 21 PCWP 11 CO/CI- 5.6/2.7  ABG post-extubation: PH 7.37 PCO2 PO2 109 K-4.7 Na-135 BUN/Cr.- 18/1.10 H/H- 9.2/27 Platelets- 140,000 CoX 76% this AM  Pump Speed: 5400 Pump Flow 4.8 lpm PI 3.5 Power 3.5 watts  Norepi 10 mcg/min Epi 2 mcg/min Milrinone 0.375 mcg/kg/min  47 year old male with severe nonischemic cardiomyopathy and cardiogenic shock now one day S/P Heartmate 3 LVAD insertion. Still requiring relatively high dose of Norepi for BP support, overall stable post-op course so far.  Jonathan Wood

## 2017-09-07 NOTE — Progress Notes (Signed)
Left abdominal VAD dressing removed and site care performed using sterile technique. Drive line exit site cleaned with Chlora prep applicators x 2, allowed to dry, and gauze dressing with Aquacel silver strip re-applied. Exit site unincorporated, two sutures intact, the velour is fully implanted at exit site. Small amount old bloody drainage with no redness, tenderness, foul odor or rash noted. Drive line anchor re-applied.     

## 2017-09-07 NOTE — Procedures (Signed)
Extubation Procedure Note  Patient Details:   Name: Jonathan Wood DOB: 09-09-1970 MRN: [id number]   Airway Documentation:    Vent end date: 09/07/17 Vent end time: 1515   Evaluation  O2 sats: stable throughout Complications: No apparent complications Patient did tolerate procedure well. Bilateral Breath Sounds: Rhonchi   Yes   Patient extubated to 4lnc with 4ppm of NO bled in. Patient is tolerating well at this time. No complications. RT will continue to monitor.  Jabree, Magaro 09/07/2017, 3:34 PM

## 2017-09-07 NOTE — Addendum Note (Signed)
Addendum  created 09/07/17 1804 by Kipp Brood, MD   Sign clinical note

## 2017-09-08 ENCOUNTER — Inpatient Hospital Stay: Payer: Self-pay

## 2017-09-08 ENCOUNTER — Encounter (HOSPITAL_COMMUNITY): Payer: Self-pay | Admitting: Cardiothoracic Surgery

## 2017-09-08 ENCOUNTER — Inpatient Hospital Stay (HOSPITAL_COMMUNITY): Payer: Medicaid Other

## 2017-09-08 LAB — PROTIME-INR
INR: 1.58
Prothrombin Time: 18.7 seconds — ABNORMAL HIGH (ref 11.4–15.2)

## 2017-09-08 LAB — CBC WITH DIFFERENTIAL/PLATELET
Basophils Absolute: 0 10*3/uL (ref 0.0–0.1)
Basophils Relative: 0 %
Eosinophils Absolute: 0 10*3/uL (ref 0.0–0.7)
Eosinophils Relative: 0 %
HCT: 25.7 % — ABNORMAL LOW (ref 39.0–52.0)
Hemoglobin: 8.3 g/dL — ABNORMAL LOW (ref 13.0–17.0)
Lymphocytes Relative: 6 %
Lymphs Abs: 1.5 10*3/uL (ref 0.7–4.0)
MCH: 28.9 pg (ref 26.0–34.0)
MCHC: 32.3 g/dL (ref 30.0–36.0)
MCV: 89.5 fL (ref 78.0–100.0)
Monocytes Absolute: 3.3 10*3/uL — ABNORMAL HIGH (ref 0.1–1.0)
Monocytes Relative: 13 %
Neutro Abs: 20.5 10*3/uL — ABNORMAL HIGH (ref 1.7–7.7)
Neutrophils Relative %: 81 %
Platelets: 146 10*3/uL — ABNORMAL LOW (ref 150–400)
RBC: 2.87 MIL/uL — ABNORMAL LOW (ref 4.22–5.81)
RDW: 14.6 % (ref 11.5–15.5)
WBC: 25.3 10*3/uL — ABNORMAL HIGH (ref 4.0–10.5)

## 2017-09-08 LAB — GLUCOSE, CAPILLARY
GLUCOSE-CAPILLARY: 106 mg/dL — AB (ref 65–99)
GLUCOSE-CAPILLARY: 106 mg/dL — AB (ref 65–99)
GLUCOSE-CAPILLARY: 112 mg/dL — AB (ref 65–99)
GLUCOSE-CAPILLARY: 118 mg/dL — AB (ref 65–99)
GLUCOSE-CAPILLARY: 130 mg/dL — AB (ref 65–99)
GLUCOSE-CAPILLARY: 161 mg/dL — AB (ref 65–99)
Glucose-Capillary: 113 mg/dL — ABNORMAL HIGH (ref 65–99)
Glucose-Capillary: 95 mg/dL (ref 65–99)
Glucose-Capillary: 120 mg/dL — ABNORMAL HIGH (ref 65–99)
Glucose-Capillary: 165 mg/dL — ABNORMAL HIGH (ref 65–99)
Glucose-Capillary: 107 mg/dL — ABNORMAL HIGH (ref 65–99)
Glucose-Capillary: 89 mg/dL (ref 65–99)
Glucose-Capillary: 137 mg/dL — ABNORMAL HIGH (ref 65–99)

## 2017-09-08 LAB — COMPREHENSIVE METABOLIC PANEL
ALT: 29 U/L (ref 17–63)
AST: 125 U/L — ABNORMAL HIGH (ref 15–41)
Albumin: 3 g/dL — ABNORMAL LOW (ref 3.5–5.0)
Alkaline Phosphatase: 65 U/L (ref 38–126)
Anion gap: 11 (ref 5–15)
BUN: 22 mg/dL — ABNORMAL HIGH (ref 6–20)
CO2: 21 mmol/L — ABNORMAL LOW (ref 22–32)
Calcium: 8.9 mg/dL (ref 8.9–10.3)
Chloride: 101 mmol/L (ref 101–111)
Creatinine, Ser: 1.4 mg/dL — ABNORMAL HIGH (ref 0.61–1.24)
GFR calc Af Amer: >60 mL/min (ref >60)
GFR calc non Af Amer: 60 mL/min — ABNORMAL LOW (ref >60)
Glucose, Bld: 121 mg/dL — ABNORMAL HIGH (ref 65–99)
Potassium: 5 mmol/L (ref 3.5–5.1)
Sodium: 133 mmol/L — ABNORMAL LOW (ref 135–145)
Total Bilirubin: 4.2 mg/dL — ABNORMAL HIGH (ref 0.3–1.2)
Total Protein: 6.4 g/dL — ABNORMAL LOW (ref 6.5–8.1)

## 2017-09-08 LAB — COOXEMETRY PANEL
Carboxyhemoglobin: 1.8 % — ABNORMAL HIGH (ref 0.5–1.5)
Carboxyhemoglobin: 1.3 % (ref 0.5–1.5)
Methemoglobin: 1.8 % — ABNORMAL HIGH (ref 0.0–1.5)
Methemoglobin: 1.6 % — ABNORMAL HIGH (ref 0.0–1.5)
O2 Saturation: 58.9 %
O2 Saturation: 46.2 %
Total hemoglobin: 7.6 g/dL — ABNORMAL LOW (ref 12.0–16.0)
Total hemoglobin: 9.3 g/dL — ABNORMAL LOW (ref 12.0–16.0)

## 2017-09-08 LAB — POCT I-STAT, CHEM 8
BUN: 23 mg/dL — AB (ref 6–20)
CALCIUM ION: 1.17 mmol/L (ref 1.15–1.40)
CHLORIDE: 97 mmol/L — AB (ref 101–111)
CREATININE: 1.2 mg/dL (ref 0.61–1.24)
GLUCOSE: 184 mg/dL — AB (ref 65–99)
HCT: 24 % — ABNORMAL LOW (ref 39.0–52.0)
Hemoglobin: 8.2 g/dL — ABNORMAL LOW (ref 13.0–17.0)
POTASSIUM: 4.5 mmol/L (ref 3.5–5.1)
Sodium: 130 mmol/L — ABNORMAL LOW (ref 135–145)
TCO2: 23 mmol/L (ref 22–32)

## 2017-09-08 LAB — PHOSPHORUS: Phosphorus: 5.2 mg/dL — ABNORMAL HIGH (ref 2.5–4.6)

## 2017-09-08 LAB — POCT I-STAT 3, ART BLOOD GAS (G3+)
ACID-BASE DEFICIT: 1 mmol/L (ref 0.0–2.0)
Bicarbonate: 23.4 mmol/L (ref 20.0–28.0)
O2 Saturation: 98 %
PCO2 ART: 37.4 mmHg (ref 32.0–48.0)
PO2 ART: 106 mmHg (ref 83.0–108.0)
Patient temperature: 37.1
TCO2: 25 mmol/L (ref 22–32)
pH, Arterial: 7.405 (ref 7.350–7.450)

## 2017-09-08 LAB — MAGNESIUM: Magnesium: 2.3 mg/dL (ref 1.7–2.4)

## 2017-09-08 LAB — LACTATE DEHYDROGENASE: LDH: 483 U/L — ABNORMAL HIGH (ref 98–192)

## 2017-09-08 MED ORDER — SODIUM CHLORIDE 0.9% FLUSH: 10.0000 mL | INTRAVENOUS | Status: DC | PRN

## 2017-09-08 MED ORDER — FUROSEMIDE 10 MG/ML IJ SOLN
60.0000 mg | Freq: Once | INTRAMUSCULAR | Status: AC
  Administered 2017-09-08: 60 mg via INTRAVENOUS
  Filled 2017-09-08: qty 6

## 2017-09-08 MED ORDER — WARFARIN SODIUM 2 MG PO TABS
4.0000 mg | ORAL_TABLET | Freq: Once | ORAL | Status: AC
  Administered 2017-09-08: 4 mg via ORAL
  Filled 2017-09-08: qty 2

## 2017-09-08 MED ORDER — FUROSEMIDE 10 MG/ML IJ SOLN
10.0000 mg/h | INTRAMUSCULAR | Status: DC
  Administered 2017-09-08 – 2017-09-11 (×4): 10 mg/h via INTRAVENOUS
  Filled 2017-09-08: qty 21
  Filled 2017-09-08 (×2): qty 25
  Filled 2017-09-08: qty 21
  Filled 2017-09-08: qty 25

## 2017-09-08 MED ORDER — ALPRAZOLAM 0.5 MG PO TABS
0.5000 mg | ORAL_TABLET | Freq: Once | ORAL | Status: AC
  Administered 2017-09-08: 0.5 mg via ORAL
  Filled 2017-09-08: qty 1

## 2017-09-08 MED ORDER — FENTANYL CITRATE (PF) 100 MCG/2ML IJ SOLN
50.0000 ug | INTRAMUSCULAR | Status: DC | PRN
  Administered 2017-09-08 – 2017-09-09 (×4): 50 ug via INTRAVENOUS
  Filled 2017-09-08 (×5): qty 2

## 2017-09-08 MED ORDER — ALPRAZOLAM 0.25 MG PO TABS
0.2500 mg | ORAL_TABLET | Freq: Three times a day (TID) | ORAL | Status: DC | PRN
Start: 2017-09-08 — End: 2017-09-19
  Administered 2017-09-08 – 2017-09-14 (×6): 0.25 mg via ORAL
  Filled 2017-09-08 (×6): qty 1

## 2017-09-08 MED ORDER — KETOROLAC TROMETHAMINE 15 MG/ML IJ SOLN
15.0000 mg | Freq: Once | INTRAMUSCULAR | Status: AC
  Administered 2017-09-08: 15 mg via INTRAVENOUS
  Filled 2017-09-08: qty 1

## 2017-09-08 MED ORDER — AMIODARONE HCL IN DEXTROSE 360-4.14 MG/200ML-% IV SOLN
60.0000 mg/h | INTRAVENOUS | Status: AC
  Administered 2017-09-08 (×2): 60 mg/h via INTRAVENOUS
  Filled 2017-09-08 (×2): qty 200

## 2017-09-08 MED ORDER — CHLORHEXIDINE GLUCONATE CLOTH 2 % EX PADS
6.0000 | MEDICATED_PAD | Freq: Every day | CUTANEOUS | Status: DC
  Administered 2017-09-09 – 2017-09-19 (×7): 6 via TOPICAL

## 2017-09-08 MED ORDER — CITALOPRAM HYDROBROMIDE 20 MG PO TABS
10.0000 mg | ORAL_TABLET | Freq: Every day | ORAL | Status: DC
  Administered 2017-09-08 – 2017-09-19 (×10): 10 mg via ORAL
  Filled 2017-09-08 (×12): qty 1

## 2017-09-08 MED ORDER — AMIODARONE HCL IN DEXTROSE 360-4.14 MG/200ML-% IV SOLN
30.0000 mg/h | INTRAVENOUS | Status: DC
  Administered 2017-09-08 – 2017-09-09 (×2): 30 mg/h via INTRAVENOUS
  Filled 2017-09-08 (×2): qty 200

## 2017-09-08 MED ORDER — FENTANYL 75 MCG/HR TD PT72
75.0000 ug | MEDICATED_PATCH | TRANSDERMAL | Status: DC
  Administered 2017-09-08 – 2017-09-11 (×2): 75 ug via TRANSDERMAL
  Filled 2017-09-08 (×2): qty 1

## 2017-09-08 MED ORDER — VANCOMYCIN HCL 10 G IV SOLR
1250.0000 mg | INTRAVENOUS | Status: DC
  Administered 2017-09-08 – 2017-09-09 (×2): 1250 mg via INTRAVENOUS
  Filled 2017-09-08 (×2): qty 1250

## 2017-09-08 MED ORDER — PIPERACILLIN-TAZOBACTAM 3.375 G IVPB
3.3750 g | Freq: Three times a day (TID) | INTRAVENOUS | Status: AC
  Administered 2017-09-08 – 2017-09-13 (×18): 3.375 g via INTRAVENOUS
  Filled 2017-09-08 (×18): qty 50

## 2017-09-08 MED ORDER — TRAZODONE HCL 50 MG PO TABS
50.0000 mg | ORAL_TABLET | Freq: Every day | ORAL | Status: DC
  Administered 2017-09-08 – 2017-09-18 (×10): 50 mg via ORAL
  Filled 2017-09-08 (×11): qty 1

## 2017-09-08 MED ORDER — SODIUM CHLORIDE 0.9% FLUSH
10.0000 mL | Freq: Two times a day (BID) | INTRAVENOUS | Status: DC
  Administered 2017-09-08: 10 mL
  Administered 2017-09-08: 20 mL
  Administered 2017-09-09 – 2017-09-13 (×9): 10 mL
  Administered 2017-09-14: 20 mL
  Administered 2017-09-14 – 2017-09-16 (×4): 10 mL
  Administered 2017-09-16: 20 mL
  Administered 2017-09-17 – 2017-09-19 (×5): 10 mL

## 2017-09-08 MED ORDER — MIDAZOLAM HCL 2 MG/2ML IJ SOLN: 1.0000 mg | INTRAMUSCULAR | Status: DC | PRN

## 2017-09-08 NOTE — Progress Notes (Signed)
PT Cancellation Note  Patient Details Name: Jonathan Wood MRN: [id number] DOB: 1971/05/13   Cancelled Treatment:    Reason Eval/Treat Not Completed: Other (comment). Pt recently transferred back to bed, fatigued after sitting in chair this morning. Now getting PICC line placed. Will follow-up for PT evaluation tomorrow.  Ina Homes, PT, DPT Acute Rehab Services  Pager: [phone]  Malachy Chamber 09/08/2017, 1:10 PM

## 2017-09-08 NOTE — Progress Notes (Signed)
OT Cancellation Note  Patient Details Name: BENINO HED MRN: [id number] DOB: Sep 24, 1970   Cancelled Treatment:    Reason Eval/Treat Not Completed: Patient at procedure or test/ unavailable. Pt recently transferred back to bed, fatigued after sitting in chair this morning. Now getting PICC line placed. Will follow-up for OT evaluation tomorrow.   Evern Bio Lavone Weisel 09/08/2017, 1:16 PM  Sherryl Manges OTR/L [phone]

## 2017-09-08 NOTE — Progress Notes (Addendum)
LVAD Coordinator Rounding Note:  Admitted 08/25/17 due to CHF. Dr. [name] consulted for Advanced Heart Failure. VAD evaluation completed.   HM III LVAD implanted on 09/07/17 by 09/06/17 under Destination Therapy criteria due to current smoking history.  Pt extubated yesterday, awake and lying in bed this am.   Vital signs: Temp:  98.5 HR: 99 A-line MAP:  95/83 (87) Doppler: 84 O2 Sat: 98% on 4 L/[place] Wt: 189>195>200 lbs    LVAD interrogation reveals:  Speed: 5400 Flow: 4.7 Power: 3.9 PI:  3.5 Alarms: none Events: 1 PI Hematocrit:  26 Fixed speed: 5400 Low speed limit: 5100  Drive Line: left abdominal drive line site with gauze dressing and anchor intact. Will be changed daily by VAD Coordinator or Nurse Alla Feeling.  Labs:  LDH trend: 420>483  INR trend: 1.38>1.58  Anticoagulation Plan: -INR Goal: 2.0 - 2.5 -ASA Dose: 325 mg daily until therapeutic INR  Blood Products:  Intra op: - 1 uint plts - 2 FFP - 250 cell saver  Device: N/A  NO: 4 ppm  Gtts: - Levo 3 mcg/min - Milrinone .4 mcg/kg/min - Epi 1 mcg/min - Amio 30 mg/hr  Arrhythmias: - Sinus tach 09/07/17 in 130's; started amiodarone (no bolus) trying to prevent post-op Afib  VAD Education:  Reviewed changing power source from PM to batteries and back.   Adverse Events on VAD: -none  Plan/Recommendations:  1. Daily dressing change per Nurse Trudee Kuster today. 2. Please call VAD pager if any VAD equipment or drive line issues.  Hessie Diener RN, VAD Coordinator 24/7 VAD pager: [phone]

## 2017-09-08 NOTE — Progress Notes (Addendum)
Dr. Donata Clay made aware of persistently elevated CVP and PA pressures despite previously ordered interventions. Also made aware of pain, anxiety, and elevated HR (ST with PVCs.) Updated on current VAD numbers and urine output. Orders received for Xanax 0.5mg  PO, Toradol 15mg  IV x 1 dose, Fentanyl patch, Amiodarone drip without bolus, Lasix 60mg  IV push, Lasix drip at 10mg  per hour, increase Milrinone to 0.35mcg, decrease Epi to , and draw an ABG. Will carry out orders, and continue to closely monitor. Thresa Ross RN

## 2017-09-08 NOTE — Progress Notes (Signed)
HeartMate 3 Rounding Note postop day #2  Subjective:    47 year old male withAA nonischemic cardiomyopathy, admitted with recurrent heart failure and cardiogenic shock with preoperative double inotropes and balloon pump.  Preoperative moderate to severe RV dysfunction, no PFO on preoperative echo bubble study   implantation of HeartMate 3 left ventricular assist device April 23.  Preoperative intra-aortic balloon pump removed.  Postop HeartMate 3 implantation with stable hemodynamics and good VAD flow parameters and  pulsatility index..  RV function postop supported with low-dose epinephrine and norepinephrine.  Maintaining sinus rhythm.  Patient was extubated postop day 1  PD catheter removed on postop day 2 and patient mobilized to chair without difficulty.  Patient has had significant issues with surgical pain.  With pain control his pulmonary status has improved.  CVP measurements have been elevated 12-16 cm water and the patient is receiving Lasix drip  Full liquid diet with fluid limit has been started LVAD INTERROGATION:  HeartMate 3 LVAD:  Flow 4.7 liters/min, speed 5400 RPM, power 4.2, PI 2.4.  Controller intact.   Objective:    Vital Signs:   Temp:  [97.6 F (36.4 C)-100 F (37.8 C)] 97.6 F (36.4 C) (04/25 1644) Pulse Rate:  [97-113] 101 (04/25 1208) Resp:  [20-43] 24 (04/25 1500) BP: (89-99)/(69-75) 89/75 (04/25 0334) SpO2:  [79 %-100 %] 100 % (04/25 1500) Arterial Line BP: (84-117)/(62-95) 97/84 (04/25 1500) Weight:  [200 lb 4.8 oz (90.9 kg)] 200 lb 4.8 oz (90.9 kg) (04/25 0415) Last BM Date: 09/04/17 Mean arterial Pressure 70-80 mmHg mean arterial pressure  Intake/Output:   Intake/Output Summary (Last 24 hours) at 09/08/2017 1650 Last data filed at 09/08/2017 1530 Gross per 24 hour  Intake 2455.78 ml  Output 2665 ml  Net -209.22 ml     Physical Exam: General:  Well appearing. No resp difficulty.  Breathing comfortably extubated HEENT: normal Neck: supple.  JVP . Carotids 2+ bilat; no bruits. No lymphadenopathy or thryomegaly appreciated. Cor: Mechanical heart sounds with LVAD hum present. Lungs: clear Abdomen: soft, nontender, nondistended. No hepatosplenomegaly. No bruits or masses. Good bowel sounds. Extremities: no cyanosis, clubbing, rash, edema Neuro: alert & orientedx3, cranial nerves grossly intact. moves all 4 extremities w/o difficulty. Affect pleasant  Telemetry: Sinus rhythm  Labs: Basic Metabolic Panel: Recent Labs  Lab 09/06/17 0430  09/06/17 1502 09/06/17 2103 09/06/17 2114 09/07/17 0411 09/07/17 1725 09/07/17 1731 09/08/17 0432  NA 133*   < > 137  --  136 138 134* 135 133*  K 4.1   < > 4.4  --  5.1 5.1 4.8 4.7 5.0  CL 99*   < > 105  --  103 106 104 103 101  CO2 26  --  25  --   --  27 21*  --  21*  GLUCOSE 118*   < > 106*  --  114* 111* 124* 129* 121*  BUN 18   < > 14  --  14 14 17 18  22*  CREATININE 0.98   < > 0.87 0.90 0.80 1.11 1.27* 1.10 1.40*  CALCIUM 8.9  --  8.2*  --   --  8.6* 8.8*  --  8.9  MG 2.1  --  1.9 3.2*  --  2.6* 2.6*  --  2.3  PHOS  --   --   --   --   --  3.7  --   --  5.2*   < > = values in this interval not displayed.    Liver  Function Tests: Recent Labs  Lab 09/04/17 0448 09/05/17 0359 09/06/17 0430 09/07/17 0411 09/08/17 0432  AST 20 22 24  160* 125*  ALT 42 39 36 31 29  ALKPHOS 79 82 76 59 65  BILITOT 1.3* 0.9 1.3* 3.9* 4.2*  PROT 7.0 6.9 7.0 6.5 6.4*  ALBUMIN 2.9* 2.9* 3.0* 3.4* 3.0*   No results for input(s): LIPASE, AMYLASE in the last 168 hours. No results for input(s): AMMONIA in the last 168 hours.  CBC: Recent Labs  Lab 09/06/17 1502 09/06/17 2103 09/06/17 2114 09/07/17 0411 09/07/17 1725 09/07/17 1731 09/08/17 0432  WBC 18.3* 13.0*  --  17.4* 24.9*  --  25.3*  NEUTROABS  --   --   --  14.0*  --   --  20.5*  HGB 8.4* 8.5* 9.2* 8.6* 8.2* 9.2* 8.3*  HCT 25.8* 26.4* 27.0* 26.8* 25.5* 27.0* 25.7*  MCV 88.7 88.3  --  88.2 88.9  --  89.5  PLT 137* 138*  --  155  140*  --  146*    INR: Recent Labs  Lab 09/06/17 1502 09/07/17 0411 09/08/17 0432  INR 1.38 1.38 1.58    Other results:  EKG:   Imaging: Dg Chest Port 1 View  Result Date: 09/08/2017 CLINICAL DATA:  47 year old male with a history of left ventricular assist device placement 09/06/2017 EXAM: PORTABLE CHEST 1 VIEW COMPARISON:  None. FINDINGS: Cardiomediastinal silhouette unchanged in size and contour with cardiomegaly. Partially imaged left ventricular assist device present. Surgical changes of median sternotomy. Unchanged position of sheath transmits a Swan-Ganz catheter with the catheter terminating in the pulmonary artery. Unchanged position of parallel right IJ central venous catheter appearing to terminate superior vena cava. Unchanged position of mediastinal and left pleural drains. Interval removal of the endotracheal tube. Interval removal of the gastric tube Patchy opacities at the lung bases, unchanged from prior. No evidence of pneumothorax. IMPRESSION: Interval extubation and gastric tube removal. Surgical changes of median sternotomy and left ventricular assist device. Unchanged right IJ sheath transmits ink Swan-Ganz catheter. Unchanged parallel right IJ central venous catheter. Unchanged mediastinal/pleural drains.  No evidence of pneumothorax. Low lung volumes with patchy opacities likely representing atelectasis. Electronically Signed   By: Gilmer Mor D.O.   On: 09/08/2017 10:00   Dg Chest Port 1 View  Result Date: 09/07/2017 CLINICAL DATA:  Intubated.  LVAD. EXAM: PORTABLE CHEST 1 VIEW COMPARISON:  09/06/2017 FINDINGS: Endotracheal tube terminates 2 cm above the carina. Right jugular Swan-Ganz catheter terminates over the main pulmonary artery, unchanged. Right jugular catheter terminates over the SVC. Enteric tube reaches the stomach. Left chest tube, mediastinal drains, and LVAD remain in place. Intra-aortic balloon pump has been removed. The cardiac silhouette remains  enlarged with persistent mild pulmonary vascular congestion and mild left basilar atelectasis. No sizable pleural effusion or pneumothorax is identified. IMPRESSION: 1. Interval removal of intra-aortic balloon pump. Other support devices as above. 2. Cardiomegaly and mild pulmonary vascular congestion. Electronically Signed   By: Sebastian Ache M.D.   On: 09/07/2017 10:56   [place] Ekg Site Rite  Result Date: 09/08/2017 If Site Rite image not attached, placement could not be confirmed due to current cardiac rhythm.    Medications:     Scheduled Medications: . acetaminophen  1,000 mg Oral Q6H   Or  . acetaminophen (TYLENOL) oral liquid 160 mg/5 mL  1,000 mg Per Tube Q6H  . aspirin EC  325 mg Oral Daily   Or  . aspirin  324 mg Per  Tube Daily   Or  . aspirin  300 mg Rectal Daily  . bisacodyl  10 mg Oral Daily   Or  . bisacodyl  10 mg Rectal Daily  . chlorhexidine  15 mL Mouth Rinse BID  . Chlorhexidine Gluconate Cloth  6 each Topical Daily  . citalopram  10 mg Oral Daily  . docusate sodium  200 mg Oral Daily  . fentaNYL  75 mcg Transdermal Q72H  . insulin aspart  0-24 Units Subcutaneous Q4H  . insulin detemir  14 Units Subcutaneous BID  . mouth rinse  15 mL Mouth Rinse q12n4p  . metoCLOPramide (REGLAN) injection  10 mg Intravenous Q6H  . pantoprazole  40 mg Oral Daily  . sodium chloride flush  10-40 mL Intracatheter Q12H  . sodium chloride flush  3 mL Intravenous Q12H  . traZODone  50 mg Oral QHS  . warfarin  4 mg Oral ONCE-1800  . Warfarin - Physician Dosing Inpatient   Does not apply q1800    Infusions: . sodium chloride 20 mL/hr at 09/07/17 1300  . sodium chloride    . sodium chloride 10 mL/hr at 09/07/17 1300  . amiodarone 30 mg/hr (09/08/17 1530)  . EPINEPHrine 4 mg in dextrose 5% 250 mL infusion (16 mcg/mL) 1 mcg/min (09/08/17 0600)  . furosemide (LASIX) infusion 10 mg/hr (09/08/17 0600)  . lactated ringers Stopped (09/06/17 1623)  . lactated ringers 10 mL/hr at 09/08/17  1201  . milrinone 0.4 mcg/kg/min (09/08/17 0600)  . norepinephrine (LEVOPHED) Adult infusion 2 mcg/min (09/08/17 0600)  . piperacillin-tazobactam (ZOSYN)  IV 3.375 g (09/08/17 1639)  . vancomycin      PRN Medications: sodium chloride, ALPRAZolam, fentaNYL (SUBLIMAZE) injection, ipratropium-albuterol, ondansetron (ZOFRAN) IV, oxyCODONE, sodium chloride flush, sodium chloride flush, traMADol   Assessment:  Nonischemic cardiomyopathy, cardiogenic shock Implantation of HeartMate 3 left ventricular assist device April 23 Stable LVAD flow parameters and hemodynamics on low-dose epinephrine and norepinephrine Preoperative and postoperative RV dysfunction requiring inotropic support  Plan/Discussion:   Pulmonary catheter removed today patient mobilized to chair.  Continuing Inhaled nitric oxide through nasal cannula to optimize RV function Slowly increasing Coumadin dose with last INR 1.6 Continue Lasix drip and low-dose inotropes until CVP improves less than 10 cm H2O  I reviewed the LVAD parameters from today, and compared the results to the patient's prior recorded data.  No programming changes were made.  The LVAD is functioning within specified parameters.  The patient performs LVAD self-test daily.  LVAD interrogation was negative for any significant power changes, alarms or PI events/speed drops.  LVAD equipment check completed and is in good working order.  Back-up equipment present.   LVAD education done on emergency procedures and precautions and reviewed exit site care.  Length of Stay: [address]  Kathlee Nations [place] III 09/08/2017, 4:50 PM

## 2017-09-08 NOTE — Progress Notes (Addendum)
Advanced Heart Failure/VAD Rounding Note  PCP-Cardiologist: No primary care provider on file.   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   Extubated 09/07/17.  Coox 58.9% this am on Milrinone 0.4, NE 3, and Epi 1. Amio 30 mg/hr.   Anxious. Having pocket pain.  Had some breakfast this am and slightly nauseated. Says he didn't sleep well   Bed weights show up 5 more lbs.   Swan numbers (Personally reviewed) PAP 37/23 (29) CVP 18 CO 5.15 CI 2.43  LVAD Interrogation HM 3: Speed: 5400 Flow: 4.8 Power: 4.0 PI: 3.2 . Occasional PI events  Objective:   Weight Range: 200 lb 4.8 oz (90.9 kg) Body mass index is 25.72 kg/m.   Vital Signs:   Temp:  [97.7 F (36.5 C)-100 F (37.8 C)] 98.2 F (36.8 C) (04/25 0715) Pulse Rate:  [73-113] 97 (04/25 0334) Resp:  [15-39] 23 (04/25 0715) BP: (89-114)/(59-75) 89/75 (04/25 0334) SpO2:  [95 %-100 %] 100 % (04/25 0715) Arterial Line BP: (84-108)/(62-84) 88/75 (04/25 0715) FiO2 (%):  [45 %-50 %] 45 % (04/24 1323) Weight:  [200 lb 4.8 oz (90.9 kg)] 200 lb 4.8 oz (90.9 kg) (04/25 0415) Last BM Date: 09/04/17  Weight change: Filed Weights   09/05/17 0400 09/07/17 0540 09/08/17 0415  Weight: 189 lb 4.8 oz (85.9 kg) 195 lb 12.3 oz (88.8 kg) 200 lb 4.8 oz (90.9 kg)    Intake/Output:   Intake/Output Summary (Last 24 hours) at 09/08/2017 0802 Last data filed at 09/08/2017 0600 Gross per 24 hour  Intake 2910.11 ml  Output 2380 ml  Net 530.11 ml      Physical Exam   GENERAL: Fatigued appearing. NAD.  HEENT: Normal. NECK: Supple, JVP to jaw. Carotids OK RIJ swan .  CARDIAC:  Sternal dressing ok Mechanical heart sounds with LVAD hum present.  LUNGS:  CTAB, normal effort.  ABDOMEN:  NT, + distended  no HSM. No bruits or masses. Hypoactive BS   LVAD exit site: Dressing dry and intact. No erythema or drainage. Stabilization device present and accurately applied. Driveline dressing changed daily per sterile technique. EXTREMITIES:  Warm  and dry. No cyanosis, clubbing, rash, 1+ edema.  NEUROLOGIC:  Alert & oriented x 3. Cranial nerves grossly intact. Moves all 4 extremities w/o difficulty. Affect flat but appropriate.   Telemetry   Sinus 80-90s, personally reviewed.   EKG    No new tracings.    Labs    CBC Recent Labs    09/07/17 0411 09/07/17 1725 09/07/17 1731 09/08/17 0432  WBC 17.4* 24.9*  --  25.3*  NEUTROABS 14.0*  --   --  20.5*  HGB 8.6* 8.2* 9.2* 8.3*  HCT 26.8* 25.5* 27.0* 25.7*  MCV 88.2 88.9  --  89.5  PLT 155 140*  --  146*   Basic Metabolic Panel Recent Labs    56/21/30 0411 09/07/17 1725 09/07/17 1731 09/08/17 0432  NA 138 134* 135 133*  K 5.1 4.8 4.7 5.0  CL 106 104 103 101  CO2 27 21*  --  21*  GLUCOSE 111* 124* 129* 121*  BUN 14 17 18  22*  CREATININE 1.11 1.27* 1.10 1.40*  CALCIUM 8.6* 8.8*  --  8.9  MG 2.6* 2.6*  --  2.3  PHOS 3.7  --   --  5.2*   Liver Function Tests Recent Labs    09/07/17 0411 09/08/17 0432  AST 160* 125*  ALT 31 29  ALKPHOS 59 65  BILITOT 3.9*  4.2*  PROT 6.5 6.4*  ALBUMIN 3.4* 3.0*   No results for input(s): LIPASE, AMYLASE in the last 72 hours. Cardiac Enzymes No results for input(s): CKTOTAL, CKMB, CKMBINDEX, TROPONINI in the last 72 hours.  BNP: BNP (last 3 results) Recent Labs    01/28/17 1412 08/25/17 1337 09/07/17 0411  BNP 2,506.0* 2,510.0* 272.7*    ProBNP (last 3 results) No results for input(s): PROBNP in the last 8760 hours.   D-Dimer No results for input(s): DDIMER in the last 72 hours. Hemoglobin A1C No results for input(s): HGBA1C in the last 72 hours. Fasting Lipid Panel No results for input(s): CHOL, HDL, LDLCALC, TRIG, CHOLHDL, LDLDIRECT in the last 72 hours. Thyroid Function Tests No results for input(s): TSH, T4TOTAL, T3FREE, THYROIDAB in the last 72 hours.  Invalid input(s): FREET3  Other results:   Imaging    [place] Ekg Site Rite  Result Date: 09/08/2017 If Site Rite image not attached, placement  could not be confirmed due to current cardiac rhythm.    Medications:     Scheduled Medications: . acetaminophen  1,000 mg Oral Q6H   Or  . acetaminophen (TYLENOL) oral liquid 160 mg/5 mL  1,000 mg Per Tube Q6H  . aspirin EC  325 mg Oral Daily   Or  . aspirin  324 mg Per Tube Daily   Or  . aspirin  300 mg Rectal Daily  . bisacodyl  10 mg Oral Daily   Or  . bisacodyl  10 mg Rectal Daily  . chlorhexidine  15 mL Mouth Rinse BID  . Chlorhexidine Gluconate Cloth  6 each Topical Daily  . docusate sodium  200 mg Oral Daily  . fentaNYL  75 mcg Transdermal Q72H  . insulin aspart  0-24 Units Subcutaneous Q4H  . insulin detemir  14 Units Subcutaneous BID  . mouth rinse  15 mL Mouth Rinse q12n4p  . metoCLOPramide (REGLAN) injection  10 mg Intravenous Q6H  . pantoprazole  40 mg Oral Daily  . sodium chloride flush  10-40 mL Intracatheter Q12H  . sodium chloride flush  3 mL Intravenous Q12H  . Warfarin - Physician Dosing Inpatient   Does not apply q1800    Infusions: . sodium chloride 20 mL/hr at 09/07/17 1300  . sodium chloride    . sodium chloride 10 mL/hr at 09/07/17 1300  . amiodarone 30 mg/hr (09/08/17 0615)  . cefUROXime (ZINACEF)  IV Stopped (09/08/17 0031)  . EPINEPHrine 4 mg in dextrose 5% 250 mL infusion (16 mcg/mL) 1 mcg/min (09/08/17 0600)  . furosemide (LASIX) infusion 10 mg/hr (09/08/17 0600)  . insulin (NOVOLIN-R) infusion 2.5 mL/hr at 09/08/17 0537  . lactated ringers Stopped (09/06/17 1623)  . lactated ringers 20 mL/hr at 09/07/17 1300  . milrinone 0.4 mcg/kg/min (09/08/17 0600)  . norepinephrine (LEVOPHED) Adult infusion 2 mcg/min (09/08/17 0600)  . vancomycin Stopped (09/07/17 2335)    PRN Medications: sodium chloride, fentaNYL (SUBLIMAZE) injection, ipratropium-albuterol, midazolam, ondansetron (ZOFRAN) IV, oxyCODONE, sodium chloride flush, sodium chloride flush, traMADol    Patient Profile   Ms Jacque is 47 year old with a history of NICM, HTN, ETOH  abuse, smoker, asthma, and chronic systolic heart failure.   Assessment/Plan   1. Cardiogenic Shock- NICM had LHC 2018 with normal cors.  - ECHO this admit EF 5-10%. RV moderately reduced.   - s/p HM 3 LVAD 09/06/17  - Coox 58.9% this am on Milrinone 0.4, NE 3, and Epi 1. Amio 30 mg/hr.  - Volume status elevated. CVP  17-18 cm - Started on lasix gtt 10 mg/hr.  - Extubated 09/07/17.  - Smoker- Not a candidate for transplant. Needs 6 months off nicotine.  - Blood Type B+  - Will discuss sildenafil with MD with component of RV failure.   2. Shock Liver - Improved with hemodynamic support.   3. AKI  - Creatinine 1.1 -> 1.4 with diuresis yesterday.  - Continue to follow closely.   4.  Tobacco Abuse  - Encouraged complete cessation.  - Needs 6 months off for transplant consideration.   5. ETOH  - Encouraged complete cessation.   6. Gout Uric Acid 14.5 treated with 3 days of steroids. Now off. Now on allopurinol - Stable.   7. CAP - Finished course of doxycyline. Improved.   8. Anxiety - Continue xanax and sertraline on 4/17.  - Elevated last night. Managing with Xanax and pain control.   I reviewed the LVAD parameters from today, and compared the results to the patient's prior recorded data.  No programming changes were made.  The LVAD is functioning within specified parameters.  The patient performs LVAD self-test daily.  LVAD interrogation was negative for any significant power changes, alarms or PI events/speed drops.  LVAD equipment check completed and is in good working order.  Back-up equipment present.   LVAD education done on emergency procedures and precautions and reviewed exit site care.   Length of Stay: [address], [place]  09/08/2017, 8:02 AM  Advanced Heart Failure Team Pager [phone] (M-F; 7a - 4p)  Please contact [facility] for night-coverage after hours (4p -7a ) and weekends on [email or web address]  Patient seen and examined with the above-signed  Advanced Practice Provider and/or Housestaff. I personally reviewed laboratory data, imaging studies and relevant notes. I independently examined the patient and formulated the important aspects of the plan. I have edited the note to reflect any of my changes or salient points. I have personally discussed the plan with the patient and/or family.  POD #2 VAD placement. Overall doing ok. CVP up. Co-ox marginal in the face of RV stunning. Ernestine Conrad numbers otherwise ok. Increase milrinone to 0.4. Cotninue lasix gtt. Mobilize. Rhythm stable. Coumadin started. VAD interrogated personally. Parameters stable. Use benzos as needed   Arvilla Meres, MD  8:36 AM

## 2017-09-08 NOTE — Progress Notes (Signed)
Peripherally Inserted Central Catheter/Midline Placement  The IV Nurse has discussed with the patient and/or persons authorized to consent for the patient, the purpose of this procedure and the potential benefits and risks involved with this procedure.  The benefits include less needle sticks, lab draws from the catheter, and the patient may be discharged home with the catheter. Risks include, but not limited to, infection, bleeding, blood clot (thrombus formation), and puncture of an artery; nerve damage and irregular heartbeat and possibility to perform a PICC exchange if needed/ordered by physician.  Alternatives to this procedure were also discussed.  Bard Power PICC patient education guide, fact sheet on infection prevention and patient information card has been provided to patient /or left at bedside.    PICC/Midline Placement Documentation  PICC Triple Lumen 09/08/17 PICC Right Brachial 44 cm 3 cm (Active)  Indication for Insertion or Continuance of Line Prolonged intravenous therapies 09/08/2017  2:00 PM  Exposed Catheter (cm) 3 cm 09/08/2017  2:00 PM  Site Assessment Clean;Dry;Intact 09/08/2017  2:00 PM  Lumen #1 Status Flushed;Blood return noted 09/08/2017  2:00 PM  Lumen #2 Status Flushed;Blood return noted 09/08/2017  2:00 PM  Lumen #3 Status Flushed;Blood return noted 09/08/2017  2:00 PM  Dressing Type Transparent 09/08/2017  2:00 PM  Dressing Status Clean;Dry;Intact;Antimicrobial disc in place 09/08/2017  2:00 PM  Dressing Change Due 09/15/17 09/08/2017  2:00 PM       Stacie Glaze Horton 09/08/2017, 2:22 PM

## 2017-09-08 NOTE — Progress Notes (Signed)
CSW met with patient who states he is doing well although struggling with some pain. Patient a little sleepy during visit due to pain medication. CSW spent some time with family in the waiting room. Mom very pleased and relieved with progress. Patient's mom and aunt asked appropriate questions and discussed training and recovery process. Family appear to be very supportive to each other and patient. CSW provided supportive intervention and will continue to follow for supportive needs throughout implant hospitalization. [name], [place], [place]

## 2017-09-08 NOTE — Progress Notes (Signed)
Ref: Patient, No Pcp Per   Subjective:  Awake and extubated yesterday.  On 0.4 mcg/kg/min of milrinone, 1 mcg/min of Norepinephrine and Amiodarone 30 mg/hr drip. Co-Ox 58.9 %.  Elevated CVP of 18 mm of Hg. CO 5.15 L.  LVAD interrogation per per Dr. [name] : Speed : 5400, FLOW : 4.8, Power 4.0 PI: 3.2.  Objective:  Vital Signs in the last 24 hours: Temp:  [97.6 F (36.4 C)-100 F (37.8 C)] 98.3 F (36.8 C) (04/25 2003) Pulse Rate:  [90-113] 90 (04/25 2000) Cardiac Rhythm: Normal sinus rhythm (04/25 2000) Resp:  [20-43] 31 (04/25 2130) BP: (89-115)/(74-97) 115/97 (04/25 2130) SpO2:  [3 %-100 %] 67 % (04/25 2130) Arterial Line BP: (84-117)/(68-95) 102/86 (04/25 2130) Weight:  [90.9 kg (200 lb 4.8 oz)] 90.9 kg (200 lb 4.8 oz) (04/25 0415)  Physical Exam: BP Readings from Last 1 Encounters:  09/08/17 (!) 115/97     Wt Readings from Last 1 Encounters:  09/08/17 90.9 kg (200 lb 4.8 oz)    Weight change: 2.055 kg (4 lb 8.5 oz) Body mass index is 25.72 kg/m. HEENT: [place]/AT, Eyes-Brown, PERL, EOMI, Conjunctiva-Pale, Sclera-Non-icteric Neck: + JVD, No bruit, Trachea midline. Right IJ swan Lungs:  Clear, Bilateral. Midline chest dressing. Cardiac:  Regular rhythm, Mechanical heart sounds with LVAD hum present. Abdomen:  Soft, non-tender. BS present. Extremities:  No edema present. No cyanosis. No clubbing. CNS: AxOx3, Cranial nerves grossly intact, moves all 4 extremities.  Skin: Warm and dry.   Intake/Output from previous day: 04/24 0701 - 04/25 0700 In: 3183.2 [P.O.:100; I.V.:2383.2; IV Piggyback:700] Out: 2500 [Urine:1950; Chest Tube:550]    Lab Results: BMET    Component Value Date/Time   NA 130 (L) 09/08/2017 1724   NA 133 (L) 09/08/2017 0432   NA 135 09/07/2017 1731   K 4.5 09/08/2017 1724   K 5.0 09/08/2017 0432   K 4.7 09/07/2017 1731   CL 97 (L) 09/08/2017 1724   CL 101 09/08/2017 0432   CL 103 09/07/2017 1731   CO2 21 (L) 09/08/2017 0432   CO2 21 (L)  09/07/2017 1725   CO2 27 09/07/2017 0411   GLUCOSE 184 (H) 09/08/2017 1724   GLUCOSE 121 (H) 09/08/2017 0432   GLUCOSE 129 (H) 09/07/2017 1731   BUN 23 (H) 09/08/2017 1724   BUN 22 (H) 09/08/2017 0432   BUN 18 09/07/2017 1731   CREATININE 1.20 09/08/2017 1724   CREATININE 1.40 (H) 09/08/2017 0432   CREATININE 1.10 09/07/2017 1731   CALCIUM 8.9 09/08/2017 0432   CALCIUM 8.8 (L) 09/07/2017 1725   CALCIUM 8.6 (L) 09/07/2017 0411   GFRNONAA 60 (L) 09/08/2017 0432   GFRNONAA >60 09/07/2017 1725   GFRNONAA >60 09/07/2017 0411   GFRAA >60 09/08/2017 0432   GFRAA >60 09/07/2017 1725   GFRAA >60 09/07/2017 0411   CBC    Component Value Date/Time   WBC 25.3 (H) 09/08/2017 0432   RBC 2.87 (L) 09/08/2017 0432   HGB 8.2 (L) 09/08/2017 1724   HCT 24.0 (L) 09/08/2017 1724   PLT 146 (L) 09/08/2017 0432   MCV 89.5 09/08/2017 0432   MCH 28.9 09/08/2017 0432   MCHC 32.3 09/08/2017 0432   RDW 14.6 09/08/2017 0432   LYMPHSABS 1.5 09/08/2017 0432   MONOABS 3.3 (H) 09/08/2017 0432   EOSABS 0.0 09/08/2017 0432   BASOSABS 0.0 09/08/2017 0432   HEPATIC Function Panel Recent Labs    09/06/17 0430 09/07/17 0411 09/08/17 0432  PROT 7.0 6.5 6.4*  HEMOGLOBIN A1C No components found for: HGA1C,  MPG CARDIAC ENZYMES Lab Results  Component Value Date   TROPONINI 0.04 (HH) 08/28/2017   TROPONINI 0.05 (HH) 08/26/2017   TROPONINI 0.05 (HH) 08/26/2017   BNP No results for input(s): PROBNP in the last 8760 hours. TSH Recent Labs    08/30/17 1237  TSH 1.957   CHOLESTEROL Recent Labs    08/30/17 1237  CHOL 111    Scheduled Meds: . acetaminophen  1,000 mg Oral Q6H   Or  . acetaminophen (TYLENOL) oral liquid 160 mg/5 mL  1,000 mg Per Tube Q6H  . aspirin EC  325 mg Oral Daily   Or  . aspirin  324 mg Per Tube Daily   Or  . aspirin  300 mg Rectal Daily  . bisacodyl  10 mg Oral Daily   Or  . bisacodyl  10 mg Rectal Daily  . chlorhexidine  15 mL Mouth Rinse BID  . Chlorhexidine  Gluconate Cloth  6 each Topical Daily  . citalopram  10 mg Oral Daily  . docusate sodium  200 mg Oral Daily  . fentaNYL  75 mcg Transdermal Q72H  . insulin aspart  0-24 Units Subcutaneous Q4H  . insulin detemir  14 Units Subcutaneous BID  . mouth rinse  15 mL Mouth Rinse q12n4p  . metoCLOPramide (REGLAN) injection  10 mg Intravenous Q6H  . pantoprazole  40 mg Oral Daily  . sodium chloride flush  10-40 mL Intracatheter Q12H  . sodium chloride flush  3 mL Intravenous Q12H  . traZODone  50 mg Oral QHS  . Warfarin - Physician Dosing Inpatient   Does not apply q1800   Continuous Infusions: . sodium chloride Stopped (09/08/17 0800)  . sodium chloride    . sodium chloride Stopped (09/08/17 2100)  . amiodarone 30 mg/hr (09/08/17 2000)  . EPINEPHrine 4 mg in dextrose 5% 250 mL infusion (16 mcg/mL) 1 mcg/min (09/08/17 2000)  . furosemide (LASIX) infusion 10 mg/hr (09/08/17 2142)  . lactated ringers Stopped (09/06/17 1623)  . lactated ringers 10 mL/hr at 09/08/17 2000  . milrinone 0.4 mcg/kg/min (09/08/17 2020)  . norepinephrine (LEVOPHED) Adult infusion 2.027 mcg/min (09/08/17 2000)  . piperacillin-tazobactam (ZOSYN)  IV 3.375 g (09/08/17 2140)  . 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

## 2017-09-09 ENCOUNTER — Inpatient Hospital Stay (HOSPITAL_COMMUNITY): Payer: Medicaid Other

## 2017-09-09 DIAGNOSIS — I5023 Acute on chronic systolic (congestive) heart failure: Secondary | ICD-10-CM

## 2017-09-09 DIAGNOSIS — R570 Cardiogenic shock: Secondary | ICD-10-CM

## 2017-09-09 DIAGNOSIS — Z95811 Presence of heart assist device: Secondary | ICD-10-CM

## 2017-09-09 LAB — CBC WITH DIFFERENTIAL/PLATELET
Basophils Absolute: 0 10*3/uL (ref 0.0–0.1)
Basophils Relative: 0 %
Eosinophils Absolute: 0 10*3/uL (ref 0.0–0.7)
Eosinophils Relative: 0 %
HCT: 23.1 % — ABNORMAL LOW (ref 39.0–52.0)
Hemoglobin: 7.3 g/dL — ABNORMAL LOW (ref 13.0–17.0)
Lymphocytes Relative: 8 %
Lymphs Abs: 1.6 10*3/uL (ref 0.7–4.0)
MCH: 28.1 pg (ref 26.0–34.0)
MCHC: 31.6 g/dL (ref 30.0–36.0)
MCV: 88.8 fL (ref 78.0–100.0)
Monocytes Absolute: 2.7 10*3/uL — ABNORMAL HIGH (ref 0.1–1.0)
Monocytes Relative: 13 %
Neutro Abs: 16.3 10*3/uL — ABNORMAL HIGH (ref 1.7–7.7)
Neutrophils Relative %: 79 %
Platelets: 159 10*3/uL (ref 150–400)
RBC: 2.6 MIL/uL — ABNORMAL LOW (ref 4.22–5.81)
RDW: 14.7 % (ref 11.5–15.5)
WBC: 20.6 10*3/uL — ABNORMAL HIGH (ref 4.0–10.5)

## 2017-09-09 LAB — POCT I-STAT, CHEM 8
BUN: 24 mg/dL — AB (ref 6–20)
CHLORIDE: 92 mmol/L — AB (ref 101–111)
CREATININE: 1.2 mg/dL (ref 0.61–1.24)
Calcium, Ion: 1.2 mmol/L (ref 1.15–1.40)
GLUCOSE: 130 mg/dL — AB (ref 65–99)
HCT: 25 % — ABNORMAL LOW (ref 39.0–52.0)
Hemoglobin: 8.5 g/dL — ABNORMAL LOW (ref 13.0–17.0)
Potassium: 3.9 mmol/L (ref 3.5–5.1)
Sodium: 132 mmol/L — ABNORMAL LOW (ref 135–145)
TCO2: 26 mmol/L (ref 22–32)

## 2017-09-09 LAB — COMPREHENSIVE METABOLIC PANEL
ALT: 27 U/L (ref 17–63)
AST: 62 U/L — ABNORMAL HIGH (ref 15–41)
Albumin: 2.8 g/dL — ABNORMAL LOW (ref 3.5–5.0)
Alkaline Phosphatase: 73 U/L (ref 38–126)
Anion gap: 15 (ref 5–15)
BUN: 22 mg/dL — ABNORMAL HIGH (ref 6–20)
CO2: 23 mmol/L (ref 22–32)
Calcium: 8.7 mg/dL — ABNORMAL LOW (ref 8.9–10.3)
Chloride: 90 mmol/L — ABNORMAL LOW (ref 101–111)
Creatinine, Ser: 1.26 mg/dL — ABNORMAL HIGH (ref 0.61–1.24)
GFR calc Af Amer: >60 mL/min (ref >60)
GFR calc non Af Amer: >60 mL/min (ref >60)
Glucose, Bld: 162 mg/dL — ABNORMAL HIGH (ref 65–99)
Potassium: 4.1 mmol/L (ref 3.5–5.1)
Sodium: 128 mmol/L — ABNORMAL LOW (ref 135–145)
Total Bilirubin: 1.7 mg/dL — ABNORMAL HIGH (ref 0.3–1.2)
Total Protein: 6.7 g/dL (ref 6.5–8.1)

## 2017-09-09 LAB — GLUCOSE, CAPILLARY
GLUCOSE-CAPILLARY: 125 mg/dL — AB (ref 65–99)
GLUCOSE-CAPILLARY: 143 mg/dL — AB (ref 65–99)
Glucose-Capillary: 166 mg/dL — ABNORMAL HIGH (ref 65–99)
Glucose-Capillary: 140 mg/dL — ABNORMAL HIGH (ref 65–99)
Glucose-Capillary: 159 mg/dL — ABNORMAL HIGH (ref 65–99)
Glucose-Capillary: 158 mg/dL — ABNORMAL HIGH (ref 65–99)
Glucose-Capillary: 118 mg/dL — ABNORMAL HIGH (ref 65–99)

## 2017-09-09 LAB — TYPE AND SCREEN
ABO/RH(D): B POS
Antibody Screen: NEGATIVE
Unit division: 0
Unit division: 0
Unit division: 0
Unit division: 0

## 2017-09-09 LAB — BPAM RBC
Blood Product Expiration Date: 201905162359
Blood Product Expiration Date: 201905212359
Blood Product Expiration Date: 201905212359
Blood Product Expiration Date: 201905212359
ISSUE DATE / TIME: 201904230713
ISSUE DATE / TIME: 201904230713
Unit Type and Rh: 7300
Unit Type and Rh: 7300
Unit Type and Rh: 7300
Unit Type and Rh: 7300

## 2017-09-09 LAB — COOXEMETRY PANEL
Carboxyhemoglobin: 1.6 % — ABNORMAL HIGH (ref 0.5–1.5)
Carboxyhemoglobin: 1.4 % (ref 0.5–1.5)
Methemoglobin: 1.7 % — ABNORMAL HIGH (ref 0.0–1.5)
Methemoglobin: 1.6 % — ABNORMAL HIGH (ref 0.0–1.5)
O2 Saturation: 58.1 %
O2 Saturation: 53.1 %
Total hemoglobin: 7.3 g/dL — ABNORMAL LOW (ref 12.0–16.0)
Total hemoglobin: 7.2 g/dL — ABNORMAL LOW (ref 12.0–16.0)

## 2017-09-09 LAB — PROTIME-INR
INR: 1.41
Prothrombin Time: 17.1 seconds — ABNORMAL HIGH (ref 11.4–15.2)

## 2017-09-09 LAB — PHOSPHORUS: Phosphorus: 4.4 mg/dL (ref 2.5–4.6)

## 2017-09-09 LAB — HEPARIN LEVEL (UNFRACTIONATED): Heparin Unfractionated: <0.1 IU/mL — ABNORMAL LOW (ref 0.30–0.70)

## 2017-09-09 LAB — PREPARE RBC (CROSSMATCH)

## 2017-09-09 LAB — VANCOMYCIN, TROUGH: VANCOMYCIN TR: 13 ug/mL — AB (ref 15–20)

## 2017-09-09 LAB — LACTATE DEHYDROGENASE: LDH: 404 U/L — ABNORMAL HIGH (ref 98–192)

## 2017-09-09 LAB — MAGNESIUM: Magnesium: 1.9 mg/dL (ref 1.7–2.4)

## 2017-09-09 MED ORDER — HEPARIN (PORCINE) IN NACL 100-0.45 UNIT/ML-% IJ SOLN
1000.0000 [IU]/h | INTRAMUSCULAR | Status: DC
  Administered 2017-09-09: 800 [IU]/h via INTRAVENOUS
  Administered 2017-09-10: 1000 [IU]/h via INTRAVENOUS
  Filled 2017-09-09 (×2): qty 250

## 2017-09-09 MED ORDER — CLONAZEPAM 1 MG PO TABS
1.0000 mg | ORAL_TABLET | Freq: Every day | ORAL | Status: DC
  Administered 2017-09-09 – 2017-09-11 (×3): 1 mg via ORAL
  Filled 2017-09-09 (×3): qty 1

## 2017-09-09 MED ORDER — SORBITOL 70 % PO SOLN
60.0000 mL | Freq: Once | ORAL | Status: DC
  Filled 2017-09-09: qty 60

## 2017-09-09 MED ORDER — SORBITOL 70 % SOLN
30.0000 mL | Freq: Once | Status: AC
  Administered 2017-09-09: 30 mL via ORAL
  Filled 2017-09-09: qty 30

## 2017-09-09 MED ORDER — VANCOMYCIN HCL 10 G IV SOLR
1250.0000 mg | Freq: Two times a day (BID) | INTRAVENOUS | Status: AC
  Administered 2017-09-09 – 2017-09-10 (×3): 1250 mg via INTRAVENOUS
  Filled 2017-09-09 (×3): qty 1250

## 2017-09-09 MED ORDER — FUROSEMIDE 10 MG/ML IJ SOLN
40.0000 mg | Freq: Once | INTRAMUSCULAR | Status: AC
  Administered 2017-09-09: 40 mg via INTRAVENOUS
  Filled 2017-09-09: qty 4

## 2017-09-09 MED ORDER — AMIODARONE HCL 200 MG PO TABS
200.0000 mg | ORAL_TABLET | Freq: Two times a day (BID) | ORAL | Status: DC
  Administered 2017-09-09 – 2017-09-19 (×21): 200 mg via ORAL
  Filled 2017-09-09 (×21): qty 1

## 2017-09-09 MED ORDER — JUVEN PO PACK
1.0000 | PACK | Freq: Two times a day (BID) | ORAL | Status: DC
  Administered 2017-09-09 – 2017-09-14 (×7): 1 via ORAL
  Filled 2017-09-09 (×12): qty 1

## 2017-09-09 MED ORDER — WARFARIN SODIUM 5 MG PO TABS
5.0000 mg | ORAL_TABLET | Freq: Once | ORAL | Status: AC
  Administered 2017-09-09: 5 mg via ORAL
  Filled 2017-09-09: qty 1

## 2017-09-09 MED ORDER — HYDRALAZINE HCL 20 MG/ML IJ SOLN
10.0000 mg | INTRAMUSCULAR | Status: DC | PRN
  Administered 2017-09-11 (×2): 10 mg via INTRAVENOUS
  Filled 2017-09-09 (×2): qty 1

## 2017-09-09 NOTE — Progress Notes (Signed)
HeartMate 3 Rounding Note postop day #2  Subjective:    47 year old male withAA nonischemic cardiomyopathy, admitted with recurrent heart failure and cardiogenic shock with preoperative double inotropes and balloon pump.  Preoperative moderate to severe RV dysfunction, no PFO on preoperative echo bubble study   implantation of HeartMate 3 left ventricular assist device April 23.  Preoperative intra-aortic balloon pump removed.  Postop HeartMate 3 implantation with stable hemodynamics and good VAD flow parameters and  pulsatility index..  RV function postop supported with low-dose epinephrine and norepinephrine.  Maintaining sinus rhythm.  Patient was extubated postop day 1  PA catheter removed on postop day 2 and patient mobilized to chair without difficulty.  Patient has had significant issues with surgical pain, but now improving.   CVP measurements have been elevated 14-16 cm water and the patient is receiving Lasix drip  Expected postop blood loss anemia hemoglobin 7.3, patient received 1 unit of packed cells today  Full liquid diet with fluid limit has been started LVAD INTERROGATION:  HeartMate 3 LVAD:  Flow 4.7 liters/min, speed 5400 RPM, power 4.2, PI 2.4.  Controller intact.   Objective:    Vital Signs:   Temp:  [97.6 F (36.4 C)-98.5 F (36.9 C)] 98.5 F (36.9 C) (04/26 1641) Pulse Rate:  [90-100] 95 (04/26 0400) Resp:  [16-34] 19 (04/26 1700) BP: (76-123)/(56-108) 106/93 (04/26 1700) SpO2:  [67 %-100 %] 99 % (04/26 1700) Arterial Line BP: (90-106)/(75-93) 96/80 (04/26 1000) Weight:  [201 lb 4.5 oz (91.3 kg)] 201 lb 4.5 oz (91.3 kg) (04/26 0615) Last BM Date: 09/04/17 Mean arterial Pressure 75--90 mmHg mean arterial pressure  Intake/Output:   Intake/Output Summary (Last 24 hours) at 09/09/2017 1807 Last data filed at 09/09/2017 1700 Gross per 24 hour  Intake 1737.38 ml  Output 1650 ml  Net 87.38 ml     Physical Exam: General:  Well appearing. No resp  difficulty.  Breathing comfortably extubated HEENT: normal Neck: supple. JVP . Carotids 2+ bilat; no bruits. No lymphadenopathy or thryomegaly appreciated. Cor: Mechanical heart sounds with LVAD hum present. Lungs: clear Abdomen: soft, nontender, nondistended. No hepatosplenomegaly. No bruits or masses. Good bowel sounds. Extremities: no cyanosis, clubbing, rash, edema Neuro: alert & orientedx3, cranial nerves grossly intact. moves all 4 extremities w/o difficulty. Affect pleasant  Telemetry: Sinus rhythm  Labs: Basic Metabolic Panel: Recent Labs  Lab 09/06/17 1502 09/06/17 2103  09/07/17 0411 09/07/17 1725 09/07/17 1731 09/08/17 0432 09/08/17 1724 09/09/17 0357 09/09/17 1554  NA 137  --    < > 138 134* 135 133* 130* 128* 132*  K 4.4  --    < > 5.1 4.8 4.7 5.0 4.5 4.1 3.9  CL 105  --    < > 106 104 103 101 97* 90* 92*  CO2 25  --   --  27 21*  --  21*  --  23  --   GLUCOSE 106*  --    < > 111* 124* 129* 121* 184* 162* 130*  BUN 14  --    < > 14 17 18  22* 23* 22* 24*  CREATININE 0.87 0.90   < > 1.11 1.27* 1.10 1.40* 1.20 1.26* 1.20  CALCIUM 8.2*  --   --  8.6* 8.8*  --  8.9  --  8.7*  --   MG 1.9 3.2*  --  2.6* 2.6*  --  2.3  --  1.9  --   PHOS  --   --   --  3.7  --   --  5.2*  --  4.4  --    < > = values in this interval not displayed.    Liver Function Tests: Recent Labs  Lab 09/05/17 0359 09/06/17 0430 09/07/17 0411 09/08/17 0432 09/09/17 0357  AST 22 24 160* 125* 62*  ALT 39 36 31 29 27   ALKPHOS 82 76 59 65 73  BILITOT 0.9 1.3* 3.9* 4.2* 1.7*  PROT 6.9 7.0 6.5 6.4* 6.7  ALBUMIN 2.9* 3.0* 3.4* 3.0* 2.8*   No results for input(s): LIPASE, AMYLASE in the last 168 hours. No results for input(s): AMMONIA in the last 168 hours.  CBC: Recent Labs  Lab 09/06/17 2103  09/07/17 0411 09/07/17 1725 09/07/17 1731 09/08/17 0432 09/08/17 1724 09/09/17 0357 09/09/17 1554  WBC 13.0*  --  17.4* 24.9*  --  25.3*  --  20.6*  --   NEUTROABS  --   --  14.0*  --   --   20.5*  --  16.3*  --   HGB 8.5*   < > 8.6* 8.2* 9.2* 8.3* 8.2* 7.3* 8.5*  HCT 26.4*   < > 26.8* 25.5* 27.0* 25.7* 24.0* 23.1* 25.0*  MCV 88.3  --  88.2 88.9  --  89.5  --  88.8  --   PLT 138*  --  155 140*  --  146*  --  159  --    < > = values in this interval not displayed.    INR: Recent Labs  Lab 09/06/17 1502 09/07/17 0411 09/08/17 0432 09/09/17 0357  INR 1.38 1.38 1.58 1.41    Other results:  EKG:   Imaging: Dg Chest Port 1 View  Result Date: 09/09/2017 CLINICAL DATA:  LVAD, chest tubes EXAM: PORTABLE CHEST 1 VIEW COMPARISON:  09/08/2017 FINDINGS: Interval removal of Swan-Ganz catheter and left chest tube. No pneumothorax. Right central line is unchanged. Cardiomegaly with vascular congestion. LVAD projects over the left lower chest. No effusions. No acute bony abnormality. IMPRESSION: Interval removal of Swan-Ganz catheter and left chest tube. No pneumothorax. Cardiomegaly, vascular congestion. Electronically Signed   By: Charlett Nose M.D.   On: 09/09/2017 09:23   Dg Chest Port 1 View  Result Date: 09/08/2017 CLINICAL DATA:  47 year old male with a history of left ventricular assist device placement 09/06/2017 EXAM: PORTABLE CHEST 1 VIEW COMPARISON:  None. FINDINGS: Cardiomediastinal silhouette unchanged in size and contour with cardiomegaly. Partially imaged left ventricular assist device present. Surgical changes of median sternotomy. Unchanged position of sheath transmits a Swan-Ganz catheter with the catheter terminating in the pulmonary artery. Unchanged position of parallel right IJ central venous catheter appearing to terminate superior vena cava. Unchanged position of mediastinal and left pleural drains. Interval removal of the endotracheal tube. Interval removal of the gastric tube Patchy opacities at the lung bases, unchanged from prior. No evidence of pneumothorax. IMPRESSION: Interval extubation and gastric tube removal. Surgical changes of median sternotomy and left  ventricular assist device. Unchanged right IJ sheath transmits ink Swan-Ganz catheter. Unchanged parallel right IJ central venous catheter. Unchanged mediastinal/pleural drains.  No evidence of pneumothorax. Low lung volumes with patchy opacities likely representing atelectasis. Electronically Signed   By: Gilmer Mor D.O.   On: 09/08/2017 10:00   [place] Ekg Site Rite  Result Date: 09/08/2017 If Site Rite image not attached, placement could not be confirmed due to current cardiac rhythm.    Medications:     Scheduled Medications: . acetaminophen  1,000 mg Oral Q6H   Or  . acetaminophen (TYLENOL) oral  liquid 160 mg/5 mL  1,000 mg Per Tube Q6H  . amiodarone  200 mg Oral BID  . aspirin EC  325 mg Oral Daily   Or  . aspirin  324 mg Per Tube Daily   Or  . aspirin  300 mg Rectal Daily  . bisacodyl  10 mg Oral Daily   Or  . bisacodyl  10 mg Rectal Daily  . chlorhexidine  15 mL Mouth Rinse BID  . Chlorhexidine Gluconate Cloth  6 each Topical Daily  . citalopram  10 mg Oral Daily  . clonazePAM  1 mg Oral QHS  . docusate sodium  200 mg Oral Daily  . fentaNYL  75 mcg Transdermal Q72H  . insulin aspart  0-24 Units Subcutaneous Q4H  . insulin detemir  14 Units Subcutaneous BID  . mouth rinse  15 mL Mouth Rinse q12n4p  . metoCLOPramide (REGLAN) injection  10 mg Intravenous Q6H  . nutrition supplement (JUVEN)  1 packet Oral BID BM  . pantoprazole  40 mg Oral Daily  . sodium chloride flush  10-40 mL Intracatheter Q12H  . sodium chloride flush  3 mL Intravenous Q12H  . sorbitol  60 mL Oral Once  . traZODone  50 mg Oral QHS  . Warfarin - Physician Dosing Inpatient   Does not apply q1800    Infusions: . sodium chloride Stopped (09/08/17 0800)  . sodium chloride    . sodium chloride Stopped (09/08/17 2100)  . EPINEPHrine 4 mg in dextrose 5% 250 mL infusion (16 mcg/mL) 1 mcg/min (09/09/17 0700)  . furosemide (LASIX) infusion 10 mg/hr (09/09/17 0700)  . heparin 800 Units/hr (09/09/17 0839)   . lactated ringers Stopped (09/06/17 1623)  . milrinone 0.4 mcg/kg/min (09/09/17 1657)  . piperacillin-tazobactam (ZOSYN)  IV 3.375 g (09/09/17 1542)  . vancomycin      PRN Medications: sodium chloride, ALPRAZolam, fentaNYL (SUBLIMAZE) injection, hydrALAZINE, ipratropium-albuterol, ondansetron (ZOFRAN) IV, oxyCODONE, sodium chloride flush, sodium chloride flush, traMADol   Assessment:  Nonischemic cardiomyopathy, cardiogenic shock Implantation of HeartMate 3 left ventricular assist device April 23 Stable LVAD flow parameters and hemodynamics on low-dose epinephrine and norepinephrine Preoperative and postoperative RV dysfunction requiring inotropic support  Plan/Discussion:   Patient progressing, ambulated 50 feet with physical therapy Maintaining sinus rhythm Postop RV dysfunction requiring IV milrinone and inhaled nitric oxide- CVP remains 16-18 We will wean nitric oxide tomorrow  VAD parameters remain satisfactory Because of lag in Coumadin effect he is now on IV heparin bridge until INR approaches 2.0  I reviewed the LVAD parameters from today, and compared the results to the patient's prior recorded data.  No programming changes were made.  The LVAD is functioning within specified parameters.  The patient performs LVAD self-test daily.  LVAD interrogation was negative for any significant power changes, alarms or PI events/speed drops.  LVAD equipment check completed and is in good working order.  Back-up equipment present.   LVAD education done on emergency procedures and precautions and reviewed exit site care.  Length of Stay: [address]  Kathlee Nations [place] 09/09/2017, 6:07 PM

## 2017-09-09 NOTE — Progress Notes (Signed)
Ref: Patient, No Pcp Per   Subjective:  Awake. Not talking much. Constipated.Afebrile.  CO-Ox 58.1 %.  CVP-16-18.  Hgb 7.3 from 8.3 LVAD speed 5400, Flow: 4.6 Power 4.0 PI: #.7, 2 PI events  Objective:  Vital Signs in the last 24 hours: Temp:  [97.6 F (36.4 C)-98.6 F (37 C)] 97.9 F (36.6 C) (04/26 0800) Pulse Rate:  [90-101] 95 (04/26 0400) Cardiac Rhythm: Normal sinus rhythm (04/26 0400) Resp:  [17-43] 17 (04/26 0900) BP: (76-115)/(56-97) 112/93 (04/26 0700) SpO2:  [3 %-100 %] 100 % (04/26 0900) Arterial Line BP: (90-117)/(75-95) 94/78 (04/26 0900) Weight:  [91.3 kg (201 lb 4.5 oz)] 91.3 kg (201 lb 4.5 oz) (04/26 0615)  Physical Exam: BP Readings from Last 1 Encounters:  09/09/17 (!) 112/93     Wt Readings from Last 1 Encounters:  09/09/17 91.3 kg (201 lb 4.5 oz)    Weight change: 0.445 kg (15.7 oz) Body mass index is 25.84 kg/m. HEENT: [place]/AT, Eyes-Brown, PERL, EOMI, Conjunctiva-Pale, Sclera-Non-icteric Neck: No JVD, No bruit, Trachea midline. Lungs:  Clear, Bilateral. Cardiac:  Regular rhythm, normal S1 and S2, no S3. II/VI systolic murmur. LVAD hum obscures native heart sounds.  Abdomen:  Soft, non-tender. BS present. Extremities:  No edema present. No cyanosis. No clubbing. CNS: AxOx3, Cranial nerves grossly intact, moves all 4 extremities.  Skin: Warm and dry.   Intake/Output from previous day: 04/25 0701 - 04/26 0700 In: 2292.4 [P.O.:240; I.V.:1652.4; IV Piggyback:400] Out: 3005 [Urine:2545; Chest Tube:460]    Lab Results: BMET    Component Value Date/Time   NA 128 (L) 09/09/2017 0357   NA 130 (L) 09/08/2017 1724   NA 133 (L) 09/08/2017 0432   K 4.1 09/09/2017 0357   K 4.5 09/08/2017 1724   K 5.0 09/08/2017 0432   CL 90 (L) 09/09/2017 0357   CL 97 (L) 09/08/2017 1724   CL 101 09/08/2017 0432   CO2 23 09/09/2017 0357   CO2 21 (L) 09/08/2017 0432   CO2 21 (L) 09/07/2017 1725   GLUCOSE 162 (H) 09/09/2017 0357   GLUCOSE 184 (H) 09/08/2017 1724    GLUCOSE 121 (H) 09/08/2017 0432   BUN 22 (H) 09/09/2017 0357   BUN 23 (H) 09/08/2017 1724   BUN 22 (H) 09/08/2017 0432   CREATININE 1.26 (H) 09/09/2017 0357   CREATININE 1.20 09/08/2017 1724   CREATININE 1.40 (H) 09/08/2017 0432   CALCIUM 8.7 (L) 09/09/2017 0357   CALCIUM 8.9 09/08/2017 0432   CALCIUM 8.8 (L) 09/07/2017 1725   GFRNONAA >60 09/09/2017 0357   GFRNONAA 60 (L) 09/08/2017 0432   GFRNONAA >60 09/07/2017 1725   GFRAA >60 09/09/2017 0357   GFRAA >60 09/08/2017 0432   GFRAA >60 09/07/2017 1725   CBC    Component Value Date/Time   WBC 20.6 (H) 09/09/2017 0357   RBC 2.60 (L) 09/09/2017 0357   HGB 7.3 (L) 09/09/2017 0357   HCT 23.1 (L) 09/09/2017 0357   PLT 159 09/09/2017 0357   MCV 88.8 09/09/2017 0357   MCH 28.1 09/09/2017 0357   MCHC 31.6 09/09/2017 0357   RDW 14.7 09/09/2017 0357   LYMPHSABS 1.6 09/09/2017 0357   MONOABS 2.7 (H) 09/09/2017 0357   EOSABS 0.0 09/09/2017 0357   BASOSABS 0.0 09/09/2017 0357   HEPATIC Function Panel Recent Labs    09/07/17 0411 09/08/17 0432 09/09/17 0357  PROT 6.5 6.4* 6.7   HEMOGLOBIN A1C No components found for: HGA1C,  MPG CARDIAC ENZYMES Lab Results  Component Value Date  TROPONINI 0.04 (HH) 08/28/2017   TROPONINI 0.05 (HH) 08/26/2017   TROPONINI 0.05 (HH) 08/26/2017   BNP No results for input(s): PROBNP in the last 8760 hours. TSH Recent Labs    08/30/17 1237  TSH 1.957   CHOLESTEROL Recent Labs    08/30/17 1237  CHOL 111    Scheduled Meds: . acetaminophen  1,000 mg Oral Q6H   Or  . acetaminophen (TYLENOL) oral liquid 160 mg/5 mL  1,000 mg Per Tube Q6H  . amiodarone  200 mg Oral BID  . aspirin EC  325 mg Oral Daily   Or  . aspirin  324 mg Per Tube Daily   Or  . aspirin  300 mg Rectal Daily  . bisacodyl  10 mg Oral Daily   Or  . bisacodyl  10 mg Rectal Daily  . chlorhexidine  15 mL Mouth Rinse BID  . Chlorhexidine Gluconate Cloth  6 each Topical Daily  . citalopram  10 mg Oral Daily  .  docusate sodium  200 mg Oral Daily  . fentaNYL  75 mcg Transdermal Q72H  . furosemide  40 mg Intravenous Once  . insulin aspart  0-24 Units Subcutaneous Q4H  . insulin detemir  14 Units Subcutaneous BID  . mouth rinse  15 mL Mouth Rinse q12n4p  . metoCLOPramide (REGLAN) injection  10 mg Intravenous Q6H  . nutrition supplement (JUVEN)  1 packet Oral BID BM  . pantoprazole  40 mg Oral Daily  . sodium chloride flush  10-40 mL Intracatheter Q12H  . sodium chloride flush  3 mL Intravenous Q12H  . sorbitol  30 mL Oral Once  . traZODone  50 mg Oral QHS  . warfarin  5 mg Oral ONCE-1800  . Warfarin - Physician Dosing Inpatient   Does not apply q1800   Continuous Infusions: . sodium chloride Stopped (09/08/17 0800)  . sodium chloride    . sodium chloride Stopped (09/08/17 2100)  . EPINEPHrine 4 mg in dextrose 5% 250 mL infusion (16 mcg/mL) 1 mcg/min (09/09/17 0700)  . furosemide (LASIX) infusion 10 mg/hr (09/09/17 0700)  . heparin 800 Units/hr (09/09/17 0839)  . lactated ringers Stopped (09/06/17 1623)  . lactated ringers 20 mL/hr at 09/09/17 0700  . milrinone 0.4 mcg/kg/min (09/09/17 0700)  . piperacillin-tazobactam (ZOSYN)  IV 3.375 g (09/09/17 0626)  . 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

## 2017-09-09 NOTE — Evaluation (Signed)
Physical Therapy Evaluation Patient Details Name: Jonathan Wood MRN: [id number] DOB: 08-Dec-1970 Today's Date: 09/09/2017   History of Present Illness  The patient is a 47 year old African American male, who presented to the hospital with fatigue, edema, dizziness, and cardiogenic shock. 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 Extubated 4/24. PMHx: Asthma, CHF, GERD, and History of hiatal hernia.  Clinical Impression  Pt very pleasant and eager to move and get rid of lines to return to independence. Pt educated for sternal precautions, purpose and roll of therapy as well as progression hopefully next session to walk and perform power transitions. Pt with decreased functional mobility, gait and independence who will benefit from acute therapy to maximize mobility adhering to precautions and management of LVAD equipment and function. Encouraged daily mobility with nursing.   VSS throughout     Follow Up Recommendations Home health PT;Supervision/Assistance - 24 hour    Equipment Recommendations  Rolling walker with 5" wheels;3in1 (PT)    Recommendations for Other Services       Precautions / Restrictions Precautions Precautions: Sternal Precaution Booklet Issued: No Precaution Comments: LVAD, multiple tubes/lines, nitric Restrictions Weight Bearing Restrictions: Yes(Sternal) Other Position/Activity Restrictions: (Sternal)      Mobility  Bed Mobility Overal bed mobility: Needs Assistance Bed Mobility: Sit to Sidelying Rolling: Min guard       Sit to sidelying: Min guard General bed mobility comments: cues for precaution and sequence with assist for lines  Transfers Overall transfer level: Needs assistance Equipment used: Rolling walker (2 wheeled) Transfers: Sit to/from Stand Sit to Stand: Min guard;+2 safety/equipment         General transfer comment: min guard assist, vc for hand placement  Ambulation/Gait Ambulation/Gait assistance: Min guard;+2  safety/equipment Ambulation Distance (Feet): 16 Feet Assistive device: Rolling walker (2 wheeled) Gait Pattern/deviations: Step-through pattern;Decreased stride length   Gait velocity interpretation: <1.8 ft/sec, indicate of risk for recurrent falls General Gait Details: pt performed 4' forward and back x 2 with RW, cues for posture and safety, limited by lines with +2 for management of equipment  Stairs            Wheelchair Mobility    Modified Rankin (Stroke Patients Only)       Balance Overall balance assessment: No apparent balance deficits (not formally assessed) Sitting-balance support: No upper extremity supported;Bilateral upper extremity supported;Feet supported Sitting balance-Leahy Scale: Fair Sitting balance - Comments: vc in sitting to prevent WB through BUE   Standing balance support: Bilateral upper extremity supported;During functional activity Standing balance-Leahy Scale: Poor Standing balance comment: RW required for balance                             Pertinent Vitals/Pain Pain Assessment: Faces Faces Pain Scale: Hurts whole lot Pain Location: sternal incision, catheter Pain Descriptors / Indicators: Discomfort;Sore;Grimacing Pain Intervention(s): Monitored during session;Repositioned    Home Living Family/patient expects to be discharged to:: Private residence Living Arrangements: Children(teenagers/school age) Available Help at Discharge: Family;Friend(s);Available 24 hours/day Type of Home: House Home Access: Stairs to enter   [employer] of Steps: 3 Home Layout: Able to live on main level with bedroom/bathroom;Two level Home Equipment: Grab bars - tub/shower      Prior Function Level of Independence: Independent         Comments: works at [employer]   Dominant Hand: Right    Extremity/Trunk Assessment   Upper Extremity Assessment Upper Extremity Assessment:  Defer to OT  evaluation    Lower Extremity Assessment Lower Extremity Assessment: Overall WFL for tasks assessed    Cervical / Trunk Assessment Cervical / Trunk Assessment: Normal  Communication   Communication: No difficulties  Cognition Arousal/Alertness: Awake/alert Behavior During Therapy: WFL for tasks assessed/performed Overall Cognitive Status: Within Functional Limits for tasks assessed                                        General Comments General comments (skin integrity, edema, etc.): VSS throughout session, no power change over this session    Exercises     Assessment/Plan    PT Assessment Patient needs continued PT services  PT Problem List Decreased mobility;Decreased activity tolerance;Decreased knowledge of use of DME;Pain;Cardiopulmonary status limiting activity;Decreased knowledge of precautions       PT Treatment Interventions DME instruction;Therapeutic activities;Gait training;Therapeutic exercise;Patient/family education;Stair training;Functional mobility training    PT Goals (Current goals can be found in the Care Plan section)  Acute Rehab PT Goals Patient Stated Goal: to get rid of stuff and move PT Goal Formulation: With patient Time For Goal Achievement: 09/23/17 Potential to Achieve Goals: Good    Frequency Min 4X/week   Barriers to discharge        Co-evaluation PT/OT/SLP Co-Evaluation/Treatment: Yes Reason for Co-Treatment: Complexity of the patient's impairments (multi-system involvement);For patient/therapist safety PT goals addressed during session: Mobility/safety with mobility;Proper use of DME OT goals addressed during session: Strengthening/ROM;Proper use of Adaptive equipment and DME;ADL's and self-care       AM-PAC PT "6 Clicks" Daily Activity  Outcome Measure Difficulty turning over in bed (including adjusting bedclothes, sheets and blankets)?: A Little Difficulty moving from lying on back to sitting on the side of  the bed? : A Little Difficulty sitting down on and standing up from a chair with arms (e.g., wheelchair, bedside commode, etc,.)?: A Little Help needed moving to and from a bed to chair (including a wheelchair)?: A Lot Help needed walking in hospital room?: A Lot Help needed climbing 3-5 steps with a railing? : A Lot 6 Click Score: 15    End of Session Equipment Utilized During Treatment: Gait belt;Oxygen(nitric) Activity Tolerance: Patient tolerated treatment well Patient left: in bed;with call bell/phone within reach Nurse Communication: Mobility status;Precautions PT Visit Diagnosis: Other abnormalities of gait and mobility (R26.89)    Time: 1222-4114 PT Time Calculation (min) (ACUTE ONLY): 22 min   Charges:   PT Evaluation $PT Eval Moderate Complexity: 1 Mod     PT G Codes:        [name], PT [phone]   Justus Duerr B Tannon Peerson 09/09/2017, 12:16 PM

## 2017-09-09 NOTE — Progress Notes (Addendum)
ANTICOAGULATION CONSULT NOTE - Initial Consult  Pharmacy Consult for Heparin Indication: LVAD  No Known Allergies  Patient Measurements: Height: 6\' 2"  (188 cm) Weight: 201 lb 4.5 oz (91.3 kg) IBW/kg (Calculated) : 82.2 Heparin Dosing Weight: 82.2 kg  Vital Signs: Temp: 98.5 F (36.9 C) (04/26 1641) Temp Source: Oral (04/26 1641) BP: 106/93 (04/26 1700)  Labs: Recent Labs    09/07/17 0411 09/07/17 1725  09/08/17 0432 09/08/17 1724 09/09/17 0357 09/09/17 1500 09/09/17 1554  HGB 8.6* 8.2*   < > 8.3* 8.2* 7.3*  --  8.5*  HCT 26.8* 25.5*   < > 25.7* 24.0* 23.1*  --  25.0*  PLT 155 140*  --  146*  --  159  --   --   LABPROT 16.8*  --   --  18.7*  --  17.1*  --   --   INR 1.38  --   --  1.58  --  1.41  --   --   HEPARINUNFRC  --   --   --   --   --   --  <0.10*  --   CREATININE 1.11 1.27*   < > 1.40* 1.20 1.26*  --  1.20   < > = values in this interval not displayed.   Estimated Creatinine Clearance: 91.3 mL/min (by C-G formula based on SCr of 1.2 mg/dL).  Assessment: 67 yoM presented with worsening HF, now s/p LVAD on 4/24. Pharmacy consulted to dose heparin. Note specific goal heparin level of 0.3. Hgb low 7.3, pltc 404. No bleeding noted. To receive 1 unit PRBC.  Goal of Therapy:  Heparin level 0.3 units/ml Monitor platelets by anticoagulation protocol: Yes   Plan:  No bolus per MD Increase heparin gtt to 900 units/hr Check heparin level in AM Daily heparin level and CBC Monitor for s/sx of bleeding  Teffany Blaszczyk A. Jeanella Craze, PharmD, BCPS Clinical Pharmacist [place] Pager: [phone]  09/09/2017,5:40 PM

## 2017-09-09 NOTE — Progress Notes (Signed)
Pharmacy Antibiotic Note  Jonathan Wood is a 47 y.o. male admitted on 08/25/2017 with worsening HF, now s/p LVAD on 4/23. Continues on vancomycin per pharmacy and Zosyn per MD for surgical prophylaxis. Has received vancomycin 1500mg  x1 pre-op, 1250mg  x3 at q12h interval, and 1250mg  x2 at q18h interval. Vancomycin trough this AM is 13. WBC 20.6, down from 25.3 yesterday. Currently AF. Scr stable at 1.26 today, estimated CrCl ~95 mL/min.  Plan: Will adjust to vancomycin 1250 mg IV q12h. Goal trough 15-20 mcg/mL.  Monitor clinical status, renal function, LOT, vancomycin levels as indicated  Height: 6\' 2"  (188 cm) Weight: 201 lb 4.5 oz (91.3 kg) IBW/kg (Calculated) : 82.2  Temp (24hrs), Avg:98 F (36.7 C), Min:97.6 F (36.4 C), Max:98.5 F (36.9 C)  Recent Labs  Lab 09/06/17 2103  09/07/17 0411 09/07/17 1725 09/07/17 1731 09/08/17 0432 09/08/17 1724 09/09/17 0357 09/09/17 1045  WBC 13.0*  --  17.4* 24.9*  --  25.3*  --  20.6*  --   CREATININE 0.90   < > 1.11 1.27* 1.10 1.40* 1.20 1.26*  --   VANCOTROUGH  --   --   --   --   --   --   --   --  13*   < > = values in this interval not displayed.    Estimated Creatinine Clearance: 87 mL/min (A) (by C-G formula based on SCr of 1.26 mg/dL (H)).    No Known Allergies  Thank you for allowing pharmacy to be a part of this patient's care.  Roderic Scarce Zigmund Daniel, PharmD PGY1 Pharmacy Resident Pager: [phone] 09/09/2017 11:58 AM

## 2017-09-09 NOTE — Progress Notes (Addendum)
ANTICOAGULATION CONSULT NOTE - Initial Consult  Pharmacy Consult for Heparin Indication: LVAD  No Known Allergies  Patient Measurements: Height: 6\' 2"  (188 cm) Weight: 201 lb 4.5 oz (91.3 kg) IBW/kg (Calculated) : 82.2 Heparin Dosing Weight: 82.2 kg  Vital Signs: Temp: 98.5 F (36.9 C) (04/26 0403) Temp Source: Oral (04/26 0403) BP: 112/93 (04/26 0700) Pulse Rate: 95 (04/26 0400)  Labs: Recent Labs    09/06/17 1502  09/07/17 0411 09/07/17 1725  09/08/17 0432 09/08/17 1724 09/09/17 0357  HGB 8.4*   < > 8.6* 8.2*   < > 8.3* 8.2* 7.3*  HCT 25.8*   < > 26.8* 25.5*   < > 25.7* 24.0* 23.1*  PLT 137*   < > 155 140*  --  146*  --  159  APTT 36  --   --   --   --   --   --   --   LABPROT 16.9*  --  16.8*  --   --  18.7*  --  17.1*  INR 1.38  --  1.38  --   --  1.58  --  1.41  CREATININE 0.87   < > 1.11 1.27*   < > 1.40* 1.20 1.26*   < > = values in this interval not displayed.   Estimated Creatinine Clearance: 87 mL/min (A) (by C-G formula based on SCr of 1.26 mg/dL (H)).  Assessment: 89 yoM presented with worsening HF, now s/p LVAD on 4/24. Pharmacy consulted to dose heparin. Note specific goal heparin level of 0.3. Hgb low 7.3, pltc 404. No bleeding noted. To receive 1 unit PRBC.  Goal of Therapy:  Heparin level 0.3 units/ml Monitor platelets by anticoagulation protocol: Yes   Plan:  No bolus per MD Start heparin gtt at 800 units/hr Check heparin level in 6 hours Daily heparin level and CBC Monitor for s/sx of bleeding  Erin N. Zigmund Daniel, PharmD PGY1 Pharmacy Resident Pager: [phone] 09/09/2017,7:36 AM

## 2017-09-09 NOTE — Progress Notes (Addendum)
LVAD Coordinator Rounding Note:  Admitted 08/25/17 due to CHF. Dr. [name] consulted for Advanced Heart Failure. VAD evaluation completed.   HM III LVAD implanted on 09/07/17 by 09/06/17 under Destination Therapy criteria due to current smoking history.  Pt sitting up in chair, dozing. No complaints this am.   Vital signs: Temp:  98.5 HR: 97 A-line MAP:  98/83 (90) Auto cuff: 96/78 (85) O2 Sat: 100% on 3 L/[place] Wt: 189>195>200>201 lbs    LVAD interrogation reveals:  Speed: 5400 Flow: 4.5 Power: 3.9 PI:  3.8 Alarms: none Events: 1 PI Hematocrit:  33 Fixed speed: 5400 Low speed limit: 5100  Drive Line: left abdominal drive line site with gauze dressing and anchor intact. Will be changed daily by VAD Coordinator or Nurse Alla Feeling.  Labs:  LDH trend: 564>332>951  INR trend: 1.38>1.58>1.41  Hgb trend: 8.5>8.2>8.3>7.3  Co-ox trend: 68>76>59>58   Anticoagulation Plan: -INR Goal: 2.0 - 2.5 -ASA Dose: 325 mg daily until therapeutic INR  Blood Products:  Intra op: - 1 uint plts - 2 FFP - 250 cell saver  Device: N/A  NO: 4 ppm  Gtts: - Levo>off 4/26 - Milrinone .4 mcg/kg/min - Epi 1 mcg/min - Amio 30 mg/hr - Lasix 10 mg/hr  Arrhythmias: - Sinus tach 09/07/17 in 130's; started amiodarone (no bolus) trying to prevent post-op Afib  VAD Education:  Delivered HM III Patient Handbook to patient and asked him to read.   Adverse Events on VAD: -none  Plan/Recommendations:  1. Daily dressing change per Nurse Alla Feeling or VAD Coordinator.  2. Please call VAD pager if any VAD equipment or drive line issues.  Hessie Diener RN, VAD Coordinator 24/7 VAD pager: [phone]

## 2017-09-09 NOTE — Evaluation (Signed)
Occupational Therapy Evaluation Patient Details Name: Jonathan Wood MRN: [id number] DOB: April 24, 1971 Today's Date: 09/09/2017    History of Present Illness The patient is a 47 year old African American male, who presented to the hospital with fatigue, edema, dizziness, and cardiogenic shock. 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 Extubated 4/24. Pt has a past medical history of Asthma, CHF, GERD, and History of hiatal hernia.   Clinical Impression   PTA Pt independent in ADL and mobility. 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 Pt is currently min guard assist +2 (safety/lines) for transfers, max A for LB ADL, total A for peri care, mod A for UB. Pt verbally educated on sternal precautions utilizing "move in the tube" terminology. Pt able to perform bed mobility at min guard level (+2 for lines). OT will continue to follow for ADL focus with sternal precaution applications, LVAD power source change over education/management, and increased independence in ADL and functional transfers. Currently anticipate that Pt will not require OT follow up - but will monitor closely through acute progress. Next session to initiate power change over, and please bring sternal precaution handout.  VSS throughout; difficulty getting O2 to read during session LVAD HM III: Speed: 5400 Flow: 4.6 Power: 4.0 PI: 3.7.    Follow Up Recommendations  No OT follow up;Supervision/Assistance - 24 hour(initially)    Equipment Recommendations  3 in 1 bedside commode;Other (comment)(AE for LB (pending progress))    Recommendations for Other Services       Precautions / Restrictions Precautions Precautions: Sternal Precaution Booklet Issued: No(verbally reviewed) Precaution Comments: LVAD, multiple tubes/lines Restrictions Weight Bearing Restrictions: Yes(Sternal) Other Position/Activity Restrictions: (Sternal)      Mobility Bed Mobility Overal bed mobility: Needs Assistance Bed Mobility: Rolling;Sit to  Sidelying Rolling: Min guard       Sit to sidelying: Min guard General bed mobility comments: educated on sequence  Transfers Overall transfer level: Needs assistance Equipment used: Rolling walker (2 wheeled) Transfers: Sit to/from Stand Sit to Stand: Min guard;Min assist;+2 safety/equipment         General transfer comment: min guard assist, vc for hand placement    Balance Overall balance assessment: Needs assistance Sitting-balance support: No upper extremity supported;Bilateral upper extremity supported;Feet supported Sitting balance-Leahy Scale: Fair Sitting balance - Comments: vc in sitting to prevent WB through BUE   Standing balance support: Bilateral upper extremity supported;During functional activity Standing balance-Leahy Scale: Poor Standing balance comment: RW required for balance                           ADL either performed or assessed with clinical judgement   ADL Overall ADL's : Needs assistance/impaired Eating/Feeding: Set up;Sitting   Grooming: Set up;Sitting   Upper Body Bathing: Moderate assistance   Lower Body Bathing: Total assistance   Upper Body Dressing : Maximal assistance   Lower Body Dressing: Total assistance   Toilet Transfer: Min guard;+2 for safety/equipment;BSC;RW   Toileting- Clothing Manipulation and Hygiene: Maximal assistance       Functional mobility during ADLs: Min guard;Minimal assistance;+2 for safety/equipment;Rolling walker;Cueing for safety General ADL Comments: Pt requires continued education on sternal precautions and applications for ADL     Vision Baseline Vision/History: Wears glasses Wears Glasses: Reading only Patient Visual Report: No change from baseline Vision Assessment?: No apparent visual deficits     Perception     Praxis      Pertinent Vitals/Pain Pain Assessment: Faces Faces Pain Scale: Hurts whole lot Pain Location:  sternal incision, catheter Pain Descriptors / Indicators:  Discomfort;Sore;Grimacing Pain Intervention(s): Monitored during session;Repositioned     Hand Dominance Right   Extremity/Trunk Assessment Upper Extremity Assessment Upper Extremity Assessment: Overall WFL for tasks assessed   Lower Extremity Assessment Lower Extremity Assessment: Overall WFL for tasks assessed   Cervical / Trunk Assessment Cervical / Trunk Assessment: Normal   Communication Communication Communication: No difficulties   Cognition Arousal/Alertness: Awake/alert Behavior During Therapy: WFL for tasks assessed/performed Overall Cognitive Status: Within Functional Limits for tasks assessed                                     General Comments  VSS throughout session, no power change over this session    Exercises     Shoulder Instructions      Home Living Family/patient expects to be discharged to:: Private residence Living Arrangements: Children(teenagers/school age) Available Help at Discharge: Family;Friend(s);Available 24 hours/day Type of Home: House Home Access: Stairs to enter [employer] of Steps: 3   Home Layout: Able to live on main level with bedroom/bathroom;Two level     Bathroom Shower/Tub: Chief Strategy Officer: Standard     Home Equipment: Grab bars - tub/shower          Prior Functioning/Environment Level of Independence: Independent        Comments: works at [employer] List: Decreased activity tolerance;Impaired balance (sitting and/or standing);Decreased safety awareness;Decreased knowledge of use of DME or AE;Decreased knowledge of precautions;Cardiopulmonary status limiting activity;Pain      OT Treatment/Interventions: Self-care/ADL training;Energy conservation;DME and/or AE instruction;Therapeutic activities;Patient/family education;Balance training    OT Goals(Current goals can be found in the care plan section) Acute Rehab OT Goals Patient Stated Goal: to  get multiple tubes/lines out OT Goal Formulation: With patient Time For Goal Achievement: 09/23/17 Potential to Achieve Goals: Good ADL Goals Pt Will Perform Grooming: with modified independence;standing Pt Will Perform Upper Body Dressing: with min guard assist;sitting;with caregiver independent in assisting Pt Will Perform Lower Body Dressing: with modified independence;sit to/from stand Pt Will Transfer to Toilet: with modified independence;ambulating Pt Will Perform Toileting - Clothing Manipulation and hygiene: with modified independence;with adaptive equipment;sit to/from stand Additional ADL Goal #1: Pt will perform bed mobility at mod I level maintaining sternal precautions prior to engaging in ADL activity Additional ADL Goal #2: Pt will change power source for VAD at indpendent level prior to engaging in ADL activity  OT Frequency: Min 3X/week   Barriers to D/C:            Co-evaluation PT/OT/SLP Co-Evaluation/Treatment: Yes Reason for Co-Treatment: Complexity of the patient's impairments (multi-system involvement);For patient/therapist safety;To address functional/ADL transfers PT goals addressed during session: Mobility/safety with mobility;Balance;Proper use of DME OT goals addressed during session: Strengthening/ROM;Proper use of Adaptive equipment and DME;ADL's and self-care      AM-PAC PT "6 Clicks" Daily Activity     Outcome Measure Help from another person eating meals?: A Little Help from another person taking care of personal grooming?: A Little Help from another person toileting, which includes using toliet, bedpan, or urinal?: A Lot Help from another person bathing (including washing, rinsing, drying)?: A Lot Help from another person to put on and taking off regular upper body clothing?: A Lot Help from another person to put on and taking off regular lower body clothing?: A Lot 6 Click Score: 14  End of Session Equipment Utilized During Treatment: Gait  belt;Rolling walker;Oxygen Nurse Communication: Mobility status  Activity Tolerance: Patient tolerated treatment well Patient left: in bed;with call bell/phone within reach  OT Visit Diagnosis: Pain;Unsteadiness on feet (R26.81);Other (comment)(LVAD) Pain - Right/Left: (central) Pain - part of body: (sternum)                Time: 1000-1029 OT Time Calculation (min): 29 min Charges:  OT General Charges $OT Visit: 1 Visit OT Evaluation $OT Eval High Complexity: 1 High G-Codes:     Sherryl Manges OTR/L [phone]  Evern Bio [employer] Kindt 09/09/2017, 11:22 AM

## 2017-09-09 NOTE — Progress Notes (Addendum)
Advanced Heart Failure/VAD Rounding Note  PCP-Cardiologist: No primary care provider on file.   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   Extubated 09/07/17.  Continues with some pocket pain but otherwise feels ok. Sat up in chair this am.  Remains on EPI 1 and milrinone 0.4. Weaning NO.   Co-ox 51% but hgb only 7.2. Dr Donata Clay has ordered 1uRBCs. CVP 12-13 range.  On lasix 10/hr. Weight still up about 15 pounds from pre-op. BM x 4.    K 3.3  INR 1.61  LVAD Interrogation HM 3: Speed: 5400 Flow: 4.7 Power: 4.2 PI: 2.4.   Objective:   Weight Range: 201 lb 4.5 oz (91.3 kg) Body mass index is 25.84 kg/m.   Vital Signs:   Temp:  [97.6 F (36.4 C)-98.6 F (37 C)] 97.9 F (36.6 C) (04/26 0800) Pulse Rate:  [90-101] 95 (04/26 0400) Resp:  [19-43] 25 (04/26 0700) BP: (76-115)/(56-97) 112/93 (04/26 0700) SpO2:  [3 %-100 %] 100 % (04/26 0700) Arterial Line BP: (89-117)/(68-95) 102/87 (04/26 0700) Weight:  [201 lb 4.5 oz (91.3 kg)] 201 lb 4.5 oz (91.3 kg) (04/26 0615) Last BM Date: 09/04/17  Weight change: Filed Weights   09/07/17 0540 09/08/17 0415 09/09/17 0615  Weight: 195 lb 12.3 oz (88.8 kg) 200 lb 4.8 oz (90.9 kg) 201 lb 4.5 oz (91.3 kg)    Intake/Output:   Intake/Output Summary (Last 24 hours) at 09/09/2017 0813 Last data filed at 09/09/2017 0731 Gross per 24 hour  Intake 2198.43 ml  Output 2880 ml  Net -681.57 ml      Physical Exam   General:  Lying in bed NAD HEENT: normal  Neck: supple. RIJ TLC JVP to jaw Carotids 2+ bilat; no bruits. No lymphadenopathy or thryomegaly appreciated. Cor: LVAD hum. Incision ok. +CT Lungs: Clear. Decreased at bases  Abdomen: obese soft, nontender, + distended. No hepatosplenomegaly. No bruits or masses. Good bowel sounds. Driveline site clean. Anchor in place.  Extremities: no cyanosis, clubbing, rash. Warm 1+ edema  Foley  Neuro: alert & oriented x 3. No focal deficits. Moves all 4 without problem    Telemetry   Sinus  90-100s, personally reviewed.   EKG    No new tracings.    Labs    CBC Recent Labs    09/08/17 0432 09/08/17 1724 09/09/17 0357  WBC 25.3*  --  20.6*  NEUTROABS 20.5*  --  16.3*  HGB 8.3* 8.2* 7.3*  HCT 25.7* 24.0* 23.1*  MCV 89.5  --  88.8  PLT 146*  --  159   Basic Metabolic Panel Recent Labs    77/93/90 0432 09/08/17 1724 09/09/17 0357  NA 133* 130* 128*  K 5.0 4.5 4.1  CL 101 97* 90*  CO2 21*  --  23  GLUCOSE 121* 184* 162*  BUN 22* 23* 22*  CREATININE 1.40* 1.20 1.26*  CALCIUM 8.9  --  8.7*  MG 2.3  --  1.9  PHOS 5.2*  --  4.4   Liver Function Tests Recent Labs    09/08/17 0432 09/09/17 0357  AST 125* 62*  ALT 29 27  ALKPHOS 65 73  BILITOT 4.2* 1.7*  PROT 6.4* 6.7  ALBUMIN 3.0* 2.8*   No results for input(s): LIPASE, AMYLASE in the last 72 hours. Cardiac Enzymes No results for input(s): CKTOTAL, CKMB, CKMBINDEX, TROPONINI in the last 72 hours.  BNP: BNP (last 3 results) Recent Labs    01/28/17 1412 08/25/17 1337 09/07/17 0411  BNP 2,506.0* 2,510.0* 272.7*    ProBNP (last 3 results) No results for input(s): PROBNP in the last 8760 hours.   D-Dimer No results for input(s): DDIMER in the last 72 hours. Hemoglobin A1C No results for input(s): HGBA1C in the last 72 hours. Fasting Lipid Panel No results for input(s): CHOL, HDL, LDLCALC, TRIG, CHOLHDL, LDLDIRECT in the last 72 hours. Thyroid Function Tests No results for input(s): TSH, T4TOTAL, T3FREE, THYROIDAB in the last 72 hours.  Invalid input(s): FREET3  Other results:   Imaging    No results found.   Medications:     Scheduled Medications: . acetaminophen  1,000 mg Oral Q6H   Or  . acetaminophen (TYLENOL) oral liquid 160 mg/5 mL  1,000 mg Per Tube Q6H  . amiodarone  200 mg Oral BID  . aspirin EC  325 mg Oral Daily   Or  . aspirin  324 mg Per Tube Daily   Or  . aspirin  300 mg Rectal Daily  . bisacodyl  10 mg Oral Daily   Or  . bisacodyl  10 mg Rectal Daily  .  chlorhexidine  15 mL Mouth Rinse BID  . Chlorhexidine Gluconate Cloth  6 each Topical Daily  . citalopram  10 mg Oral Daily  . docusate sodium  200 mg Oral Daily  . fentaNYL  75 mcg Transdermal Q72H  . insulin aspart  0-24 Units Subcutaneous Q4H  . insulin detemir  14 Units Subcutaneous BID  . mouth rinse  15 mL Mouth Rinse q12n4p  . metoCLOPramide (REGLAN) injection  10 mg Intravenous Q6H  . pantoprazole  40 mg Oral Daily  . sodium chloride flush  10-40 mL Intracatheter Q12H  . sodium chloride flush  3 mL Intravenous Q12H  . traZODone  50 mg Oral QHS  . warfarin  5 mg Oral ONCE-1800  . Warfarin - Physician Dosing Inpatient   Does not apply q1800    Infusions: . sodium chloride Stopped (09/08/17 0800)  . sodium chloride    . sodium chloride Stopped (09/08/17 2100)  . EPINEPHrine 4 mg in dextrose 5% 250 mL infusion (16 mcg/mL) 1 mcg/min (09/09/17 0700)  . furosemide (LASIX) infusion 10 mg/hr (09/09/17 0700)  . heparin    . lactated ringers Stopped (09/06/17 1623)  . lactated ringers 20 mL/hr at 09/09/17 0700  . milrinone 0.4 mcg/kg/min (09/09/17 0700)  . piperacillin-tazobactam (ZOSYN)  IV 3.375 g (09/09/17 0626)  . vancomycin 1,250 mg (09/08/17 1700)    PRN Medications: sodium chloride, ALPRAZolam, fentaNYL (SUBLIMAZE) injection, ipratropium-albuterol, ondansetron (ZOFRAN) IV, oxyCODONE, sodium chloride flush, sodium chloride flush, traMADol    Patient Profile   Ms Mcquillen is 47 year old with a history of NICM, HTN, ETOH abuse, smoker, asthma, and chronic systolic heart failure.   Assessment/Plan   1. Cardiogenic Shock- NICM had LHC 2018 with normal cors.  - ECHO this admit EF 5-10%. RV moderately reduced.   - s/p HM 3 LVAD 09/06/17  - Coox 51% this am on Milrinone 0.4 and Epi 1. Hgb low. Transfuse 1uRBCs today. Continue inotropes - Volume status remains elevated. CVP ~ 12  - Continue lasix gtt 10 mg/hr. Will give metolazone 2.5 as weight still up 15 pounds - Smoker-  Not a candidate for transplant. Needs 6 months off nicotine.  - Blood Type B+  - On heparin/coumadin. Discussed dosing with PharmD personally. - Add neurontin for pocket pain - LDH 367 - Continue to mobilize - Continue IS  2. Post op anemia -  Hgb 7.2 this am. Ordered for 1 uPRBC  3. Shock Liver - Improved with hemodynamic support.   4. AKI  - Creatinine stable at 1.2 - Continue to follow closely.   5. Hypokalemia - will supp  6.  Tobacco Abuse  - Encouraged complete cessation.  - Needs 6 months off for transplant consideration.   7. ETOH  - Encouraged complete cessation.   8. Gout Uric Acid 14.5 treated with 3 days of steroids. Now off. Now on allopurinol - Stable.   9. CAP - Finished abx  10. Anxiety - Continue xanax and sertraline on 4/17.  - Improved on Celexa and Xanax.   I reviewed the LVAD parameters from today, and compared the results to the patient's prior recorded data.  No programming changes were made.  The LVAD is functioning within specified parameters.  The patient performs LVAD self-test daily.  LVAD interrogation was negative for any significant power changes, alarms or PI events/speed drops.  LVAD equipment check completed and is in good working order.  Back-up equipment present.   LVAD education done on emergency procedures and precautions and reviewed exit site care.   Length of Stay: 15  Arvilla Meres, MD  1:46 PM   Advanced Heart Failure Team Pager [phone] (M-F; 7a - 4p)  Please contact [facility] for night-coverage after hours (4p -7a ) and weekends on [email or web address]

## 2017-09-10 ENCOUNTER — Inpatient Hospital Stay (HOSPITAL_COMMUNITY): Payer: Medicaid Other

## 2017-09-10 DIAGNOSIS — Z95811 Presence of heart assist device: Secondary | ICD-10-CM

## 2017-09-10 LAB — GLUCOSE, CAPILLARY
GLUCOSE-CAPILLARY: 165 mg/dL — AB (ref 65–99)
GLUCOSE-CAPILLARY: 79 mg/dL (ref 65–99)
GLUCOSE-CAPILLARY: 86 mg/dL (ref 65–99)
GLUCOSE-CAPILLARY: 95 mg/dL (ref 65–99)
Glucose-Capillary: 79 mg/dL (ref 65–99)
Glucose-Capillary: 81 mg/dL (ref 65–99)

## 2017-09-10 LAB — CBC WITH DIFFERENTIAL/PLATELET
Basophils Absolute: 0 10*3/uL (ref 0.0–0.1)
Basophils Relative: 0 %
Eosinophils Absolute: 0.2 10*3/uL (ref 0.0–0.7)
Eosinophils Relative: 1 %
HCT: 21.8 % — ABNORMAL LOW (ref 39.0–52.0)
Hemoglobin: 7.1 g/dL — ABNORMAL LOW (ref 13.0–17.0)
Lymphocytes Relative: 13 %
Lymphs Abs: 2.3 10*3/uL (ref 0.7–4.0)
MCH: 28.6 pg (ref 26.0–34.0)
MCHC: 32.6 g/dL (ref 30.0–36.0)
MCV: 87.9 fL (ref 78.0–100.0)
Monocytes Absolute: 2.5 10*3/uL — ABNORMAL HIGH (ref 0.1–1.0)
Monocytes Relative: 14 %
Neutro Abs: 12.5 10*3/uL — ABNORMAL HIGH (ref 1.7–7.7)
Neutrophils Relative %: 72 %
Platelets: 217 10*3/uL (ref 150–400)
RBC: 2.48 MIL/uL — ABNORMAL LOW (ref 4.22–5.81)
RDW: 14.7 % (ref 11.5–15.5)
WBC: 17.5 10*3/uL — ABNORMAL HIGH (ref 4.0–10.5)

## 2017-09-10 LAB — BASIC METABOLIC PANEL
ANION GAP: 10 (ref 5–15)
BUN: 23 mg/dL — ABNORMAL HIGH (ref 6–20)
CALCIUM: 8.6 mg/dL — AB (ref 8.9–10.3)
CO2: 28 mmol/L (ref 22–32)
Chloride: 92 mmol/L — ABNORMAL LOW (ref 101–111)
Creatinine, Ser: 1.21 mg/dL (ref 0.61–1.24)
Glucose, Bld: 114 mg/dL — ABNORMAL HIGH (ref 65–99)
POTASSIUM: 3.3 mmol/L — AB (ref 3.5–5.1)
Sodium: 130 mmol/L — ABNORMAL LOW (ref 135–145)

## 2017-09-10 LAB — COOXEMETRY PANEL
CARBOXYHEMOGLOBIN: 1.3 % (ref 0.5–1.5)
Carboxyhemoglobin: 1.8 % — ABNORMAL HIGH (ref 0.5–1.5)
METHEMOGLOBIN: 1.3 % (ref 0.0–1.5)
Methemoglobin: 1 % (ref 0.0–1.5)
O2 SAT: 44 %
O2 Saturation: 51.4 %
Total hemoglobin: 7.2 g/dL — ABNORMAL LOW (ref 12.0–16.0)
Total hemoglobin: 9.7 g/dL — ABNORMAL LOW (ref 12.0–16.0)

## 2017-09-10 LAB — PREPARE RBC (CROSSMATCH)

## 2017-09-10 LAB — POCT I-STAT, CHEM 8
BUN: 24 mg/dL — ABNORMAL HIGH (ref 6–20)
CALCIUM ION: 1.15 mmol/L (ref 1.15–1.40)
CHLORIDE: 92 mmol/L — AB (ref 101–111)
Creatinine, Ser: 1.1 mg/dL (ref 0.61–1.24)
Glucose, Bld: 115 mg/dL — ABNORMAL HIGH (ref 65–99)
HCT: 26 % — ABNORMAL LOW (ref 39.0–52.0)
HEMOGLOBIN: 8.8 g/dL — AB (ref 13.0–17.0)
Potassium: 4 mmol/L (ref 3.5–5.1)
SODIUM: 132 mmol/L — AB (ref 135–145)
TCO2: 28 mmol/L (ref 22–32)

## 2017-09-10 LAB — PHOSPHORUS: Phosphorus: 3.6 mg/dL (ref 2.5–4.6)

## 2017-09-10 LAB — LACTATE DEHYDROGENASE: LDH: 367 U/L — ABNORMAL HIGH (ref 98–192)

## 2017-09-10 LAB — HEPARIN LEVEL (UNFRACTIONATED)

## 2017-09-10 LAB — PROTIME-INR
INR: 1.61
Prothrombin Time: 19 seconds — ABNORMAL HIGH (ref 11.4–15.2)

## 2017-09-10 LAB — MAGNESIUM: Magnesium: 2.1 mg/dL (ref 1.7–2.4)

## 2017-09-10 MED ORDER — POTASSIUM CHLORIDE 10 MEQ/50ML IV SOLN
10.0000 meq | INTRAVENOUS | Status: AC
  Administered 2017-09-10 (×2): 10 meq via INTRAVENOUS
  Filled 2017-09-10 (×2): qty 50

## 2017-09-10 MED ORDER — SENNOSIDES-DOCUSATE SODIUM 8.6-50 MG PO TABS
2.0000 | ORAL_TABLET | Freq: Every day | ORAL | Status: DC
  Administered 2017-09-11 – 2017-09-18 (×7): 2 via ORAL
  Filled 2017-09-10 (×7): qty 2

## 2017-09-10 MED ORDER — GABAPENTIN 300 MG PO CAPS
300.0000 mg | ORAL_CAPSULE | Freq: Two times a day (BID) | ORAL | Status: DC
  Administered 2017-09-10 – 2017-09-11 (×3): 300 mg via ORAL
  Filled 2017-09-10 (×3): qty 1

## 2017-09-10 MED ORDER — INSULIN ASPART 100 UNIT/ML ~~LOC~~ SOLN
0.0000 [IU] | Freq: Three times a day (TID) | SUBCUTANEOUS | Status: DC
  Administered 2017-09-10: 3 [IU] via SUBCUTANEOUS
  Administered 2017-09-11 – 2017-09-19 (×5): 2 [IU] via SUBCUTANEOUS

## 2017-09-10 MED ORDER — WARFARIN SODIUM 7.5 MG PO TABS
7.5000 mg | ORAL_TABLET | Freq: Once | ORAL | Status: AC
  Administered 2017-09-10: 7.5 mg via ORAL
  Filled 2017-09-10: qty 1

## 2017-09-10 MED ORDER — SENNOSIDES-DOCUSATE SODIUM 8.6-50 MG PO TABS: 1.0000 | ORAL_TABLET | Freq: Every day | ORAL | Status: DC

## 2017-09-10 MED ORDER — METOLAZONE 2.5 MG PO TABS
2.5000 mg | ORAL_TABLET | Freq: Once | ORAL | Status: AC
  Administered 2017-09-10: 2.5 mg via ORAL
  Filled 2017-09-10: qty 1

## 2017-09-10 MED ORDER — POTASSIUM CHLORIDE CRYS ER 20 MEQ PO TBCR
20.0000 meq | EXTENDED_RELEASE_TABLET | ORAL | Status: AC
  Administered 2017-09-10 (×3): 20 meq via ORAL
  Filled 2017-09-10 (×3): qty 1

## 2017-09-10 MED ORDER — INSULIN ASPART 100 UNIT/ML ~~LOC~~ SOLN: 0.0000 [IU] | Freq: Every day | SUBCUTANEOUS | Status: DC

## 2017-09-10 NOTE — Progress Notes (Addendum)
HeartMate 3 Rounding Note postop day #4  Subjective:    47 year old male withAA nonischemic cardiomyopathy, admitted with recurrent heart failure and cardiogenic shock with preoperative double inotropes and balloon pump.  Preoperative moderate to severe RV dysfunction, no PFO on preoperative echo bubble study   implantation of HeartMate 3 left ventricular assist device April 23.  Preoperative intra-aortic balloon pump removed.  Postop HeartMate 3 implantation with stable hemodynamics and good VAD flow parameters and  pulsatility index..  RV function postop supported with low-dose epinephrine and norepinephrine.  Maintaining sinus rhythm.  Patient was extubated postop day 1  PA catheter removed on postop day 2 and patient mobilized to chair without difficulty.  Patient has had significant issues with surgical pain, but now improving.   CVP measurements have been elevated 14-16 cm water and the patient is receiving Lasix drip Willwean inhaled nitric oxide slowly and watch RV fx, cont lasix drip  Expected postop blood loss anemia hemoglobin 7.2, co-ox low this am will transfuse second unit PRBC  Full liquid diet with fluid limit has been started LVAD INTERROGATION:  HeartMate 3 LVAD:  Flow 4.7 liters/min, speed 5400 RPM, power 4.2, PI 2.4.  Controller intact.   Objective:    Vital Signs:   Temp:  [97.6 F (36.4 C)-98.6 F (37 C)] 98 F (36.7 C) (04/27 0731) Pulse Rate:  [94-100] 100 (04/27 0400) Resp:  [15-28] 15 (04/27 0700) BP: (83-126)/(67-108) 126/72 (04/27 0700) SpO2:  [95 %-100 %] 98 % (04/27 0700) Arterial Line BP: (94-96)/(78-80) 96/80 (04/26 1000) Weight:  [200 lb 6.4 oz (90.9 kg)] 200 lb 6.4 oz (90.9 kg) (04/27 0555) Last BM Date: 09/10/17 Mean arterial Pressure 75--90 mmHg mean arterial pressure  Intake/Output:   Intake/Output Summary (Last 24 hours) at 09/10/2017 0825 Last data filed at 09/10/2017 0800 Gross per 24 hour  Intake 1495.43 ml  Output 2875 ml  Net  -1379.57 ml     Physical Exam: General:  Well appearing. No resp difficulty.  Breathing comfortably extubated HEENT: normal Neck: supple. JVP . Carotids 2+ bilat; no bruits. No lymphadenopathy or thryomegaly appreciated. Cor: Mechanical heart sounds with LVAD hum present. Lungs: clear Abdomen: soft, nontender, nondistended. No hepatosplenomegaly. No bruits or masses. Good bowel sounds. Extremities: no cyanosis, clubbing, rash, edema Neuro: alert & orientedx3, cranial nerves grossly intact. moves all 4 extremities w/o difficulty. Affect pleasant  Telemetry: Sinus rhythm  Labs: Basic Metabolic Panel: Recent Labs  Lab 09/07/17 0411 09/07/17 1725  09/08/17 0432 09/08/17 1724 09/09/17 0357 09/09/17 1554 09/10/17 0423  NA 138 134*   < > 133* 130* 128* 132* 130*  K 5.1 4.8   < > 5.0 4.5 4.1 3.9 3.3*  CL 106 104   < > 101 97* 90* 92* 92*  CO2 27 21*  --  21*  --  23  --  28  GLUCOSE 111* 124*   < > 121* 184* 162* 130* 114*  BUN 14 17   < > 22* 23* 22* 24* 23*  CREATININE 1.11 1.27*   < > 1.40* 1.20 1.26* 1.20 1.21  CALCIUM 8.6* 8.8*  --  8.9  --  8.7*  --  8.6*  MG 2.6* 2.6*  --  2.3  --  1.9  --  2.1  PHOS 3.7  --   --  5.2*  --  4.4  --  3.6   < > = values in this interval not displayed.    Liver Function Tests: Recent Labs  Lab 09/05/17 0359  09/06/17 0430 09/07/17 0411 09/08/17 0432 09/09/17 0357  AST 22 24 160* 125* 62*  ALT 39 36 31 29 27   ALKPHOS 82 76 59 65 73  BILITOT 0.9 1.3* 3.9* 4.2* 1.7*  PROT 6.9 7.0 6.5 6.4* 6.7  ALBUMIN 2.9* 3.0* 3.4* 3.0* 2.8*   No results for input(s): LIPASE, AMYLASE in the last 168 hours. No results for input(s): AMMONIA in the last 168 hours.  CBC: Recent Labs  Lab 09/07/17 0411 09/07/17 1725  09/08/17 0432 09/08/17 1724 09/09/17 0357 09/09/17 1554 09/10/17 0423  WBC 17.4* 24.9*  --  25.3*  --  20.6*  --  17.5*  NEUTROABS 14.0*  --   --  20.5*  --  16.3*  --  12.5*  HGB 8.6* 8.2*   < > 8.3* 8.2* 7.3* 8.5* 7.1*  HCT  26.8* 25.5*   < > 25.7* 24.0* 23.1* 25.0* 21.8*  MCV 88.2 88.9  --  89.5  --  88.8  --  87.9  PLT 155 140*  --  146*  --  159  --  217   < > = values in this interval not displayed.    INR: Recent Labs  Lab 09/06/17 1502 09/07/17 0411 09/08/17 0432 09/09/17 0357 09/10/17 0423  INR 1.38 1.38 1.58 1.41 1.61    Other results:  EKG:   Imaging: Dg Chest Port 1 View  Result Date: 09/10/2017 CLINICAL DATA:  LVAD in place. EXAM: PORTABLE CHEST 1 VIEW COMPARISON:  Chest x-ray from yesterday. FINDINGS: Unchanged right internal jugular central venous catheter and right PICC line. Stable cardiomegaly with partial visualization of the LVAD. Pulmonary vascular congestion is unchanged. Unchanged left greater than right basilar atelectasis with probable small left pleural effusion. No pneumothorax. No acute osseous abnormality. IMPRESSION: 1. Stable chest. Unchanged bibasilar atelectasis and probable small left pleural effusion. Electronically Signed   By: Obie Dredge M.D.   On: 09/10/2017 07:23   Dg Chest Port 1 View  Result Date: 09/09/2017 CLINICAL DATA:  LVAD, chest tubes EXAM: PORTABLE CHEST 1 VIEW COMPARISON:  09/08/2017 FINDINGS: Interval removal of Swan-Ganz catheter and left chest tube. No pneumothorax. Right central line is unchanged. Cardiomegaly with vascular congestion. LVAD projects over the left lower chest. No effusions. No acute bony abnormality. IMPRESSION: Interval removal of Swan-Ganz catheter and left chest tube. No pneumothorax. Cardiomegaly, vascular congestion. Electronically Signed   By: Charlett Nose M.D.   On: 09/09/2017 09:23     Medications:     Scheduled Medications: . acetaminophen  1,000 mg Oral Q6H   Or  . acetaminophen (TYLENOL) oral liquid 160 mg/5 mL  1,000 mg Per Tube Q6H  . amiodarone  200 mg Oral BID  . aspirin EC  325 mg Oral Daily   Or  . aspirin  324 mg Per Tube Daily   Or  . aspirin  300 mg Rectal Daily  . bisacodyl  10 mg Oral Daily   Or   . bisacodyl  10 mg Rectal Daily  . chlorhexidine  15 mL Mouth Rinse BID  . Chlorhexidine Gluconate Cloth  6 each Topical Daily  . citalopram  10 mg Oral Daily  . clonazePAM  1 mg Oral QHS  . docusate sodium  200 mg Oral Daily  . fentaNYL  75 mcg Transdermal Q72H  . insulin aspart  0-15 Units Subcutaneous TID WC  . insulin aspart  0-5 Units Subcutaneous QHS  . insulin detemir  14 Units Subcutaneous BID  . mouth rinse  15  mL Mouth Rinse q12n4p  . metoCLOPramide (REGLAN) injection  10 mg Intravenous Q6H  . nutrition supplement (JUVEN)  1 packet Oral BID BM  . pantoprazole  40 mg Oral Daily  . potassium chloride  20 mEq Oral Q4H  . sodium chloride flush  10-40 mL Intracatheter Q12H  . sodium chloride flush  3 mL Intravenous Q12H  . sorbitol  60 mL Oral Once  . traZODone  50 mg Oral QHS  . warfarin  7.5 mg Oral ONCE-1800  . Warfarin - Physician Dosing Inpatient   Does not apply q1800    Infusions: . sodium chloride Stopped (09/08/17 0800)  . sodium chloride    . sodium chloride Stopped (09/08/17 2100)  . EPINEPHrine 4 mg in dextrose 5% 250 mL infusion (16 mcg/mL) 1 mcg/min (09/10/17 0600)  . furosemide (LASIX) infusion 10 mg/hr (09/10/17 0600)  . heparin 900 Units/hr (09/10/17 0600)  . lactated ringers Stopped (09/06/17 1623)  . milrinone 0.4 mcg/kg/min (09/10/17 0600)  . piperacillin-tazobactam (ZOSYN)  IV Stopped (09/10/17 0837)  . potassium chloride    . vancomycin Stopped (09/09/17 2309)    PRN Medications: sodium chloride, ALPRAZolam, fentaNYL (SUBLIMAZE) injection, hydrALAZINE, ipratropium-albuterol, ondansetron (ZOFRAN) IV, oxyCODONE, sodium chloride flush, sodium chloride flush, traMADol   Assessment:  Nonischemic cardiomyopathy, cardiogenic shock Implantation of HeartMate 3 left ventricular assist device April 23 Stable LVAD flow parameters and hemodynamics on low-dose epinephrine and norepinephrine Preoperative and postoperative RV dysfunction requiring inotropic  support  Plan/Discussion:   Patient progressing, ambulated 50 feet with physical therapy. Will ambulate in hall after off nitric oxide Maintaining sinus rhythm Postop RV dysfunction requiring IV milrinone and inhaled nitric oxide- CVP remains 16-18 We will wean nitric oxide  And cont inoropes One unit packed cells for postop anemia  VAD parameters remain satisfactory Because of lag in Coumadin effect he is now on IV heparin bridge until INR approaches 2.0  I reviewed the LVAD parameters from today, and compared the results to the patient's prior recorded data.  No programming changes were made.  The LVAD is functioning within specified parameters.  The patient performs LVAD self-test daily.  LVAD interrogation was negative for any significant power changes, alarms or PI events/speed drops.  LVAD equipment check completed and is in good working order.  Back-up equipment present.   LVAD education done on emergency procedures and precautions and reviewed exit site care.  Length of Stay: [address]  Kathlee Nations Wells III 09/10/2017, 8:25 AM

## 2017-09-10 NOTE — Progress Notes (Signed)
Subjective:  Patient denies any chest pain or shortness of breath states slowly improving.LVAD parameters stable  Objective:  Vital Signs in the last 24 hours: Temp:  [97.6 F (36.4 C)-98.6 F (37 C)] 97.8 F (36.6 C) (04/27 0945) Pulse Rate:  [94-102] 102 (04/27 0945) Resp:  [15-30] 30 (04/27 0950) BP: (83-126)/(67-108) 116/103 (04/27 0945) SpO2:  [93 %-100 %] 93 % (04/27 0950) Weight:  [90.9 kg (200 lb 6.4 oz)] 90.9 kg (200 lb 6.4 oz) (04/27 0555)  Intake/Output from previous day: 04/26 0701 - 04/27 0700 In: 1524.1 [I.V.:1074.1; IV Piggyback:450] Out: 2715 [Urine:2585; Chest Tube:130] Intake/Output from this shift: Total I/O In: 33.1 [I.V.:33.1] Out: 260 [Urine:260]  Physical Exam: Neck: no adenopathy, no carotid bruit, no JVD and supple, symmetrical, trachea midline Lungs: Decreased breath sound at bases Heart: regular rate and rhythm and LVAD hum noted Abdomen: soft, non-tender; bowel sounds normal; no masses,  no organomegaly Extremities: extremities normal, atraumatic, no cyanosis or edema  Lab Results: Recent Labs    09/09/17 0357 09/09/17 1554 09/10/17 0423  WBC 20.6*  --  17.5*  HGB 7.3* 8.5* 7.1*  PLT 159  --  217   Recent Labs    09/09/17 0357 09/09/17 1554 09/10/17 0423  NA 128* 132* 130*  K 4.1 3.9 3.3*  CL 90* 92* 92*  CO2 23  --  28  GLUCOSE 162* 130* 114*  BUN 22* 24* 23*  CREATININE 1.26* 1.20 1.21   No results for input(s): TROPONINI in the last 72 hours.  Invalid input(s): CK, MB Hepatic Function Panel Recent Labs    09/09/17 0357  PROT 6.7  ALBUMIN 2.8*  AST 62*  ALT 27  ALKPHOS 73  BILITOT 1.7*   No results for input(s): CHOL in the last 72 hours. No results for input(s): PROTIME in the last 72 hours.  Imaging: Imaging results have been reviewed and Dg Chest Port 1 View  Result Date: 09/10/2017 CLINICAL DATA:  LVAD in place. EXAM: PORTABLE CHEST 1 VIEW COMPARISON:  Chest x-ray from yesterday. FINDINGS: Unchanged right  internal jugular central venous catheter and right PICC line. Stable cardiomegaly with partial visualization of the LVAD. Pulmonary vascular congestion is unchanged. Unchanged left greater than right basilar atelectasis with probable small left pleural effusion. No pneumothorax. No acute osseous abnormality. IMPRESSION: 1. Stable chest. Unchanged bibasilar atelectasis and probable small left pleural effusion. Electronically Signed   By: Obie Dredge M.D.   On: 09/10/2017 07:23   Dg Chest Port 1 View  Result Date: 09/09/2017 CLINICAL DATA:  LVAD, chest tubes EXAM: PORTABLE CHEST 1 VIEW COMPARISON:  09/08/2017 FINDINGS: Interval removal of Swan-Ganz catheter and left chest tube. No pneumothorax. Right central line is unchanged. Cardiomegaly with vascular congestion. LVAD projects over the left lower chest. No effusions. No acute bony abnormality. IMPRESSION: Interval removal of Swan-Ganz catheter and left chest tube. No pneumothorax. Cardiomegaly, vascular congestion. Electronically Signed   By: Charlett Nose M.D.   On: 09/09/2017 09:23    Cardiac Studies:  Assessment/Plan:  Severe nonischemic dilated cardiomyopathy/status post cardiogenic shock status post implantation of a HeartMate 3 LVAD assist device doing well Hypertension Chronic kidney disease Postop anemia History of EtOH abuse Tobacco abuse Hypokalemia Anxiety disorder PLAN Continue present management per CVTS and advance heart failure team  LOS: 16 days    Rinaldo Cloud 09/10/2017, 10:07 AM

## 2017-09-10 NOTE — Progress Notes (Signed)
ANTICOAGULATION CONSULT NOTE - Follow Up Consult  Pharmacy Consult for Heparin > warfarin  Indication: LVAD  No Known Allergies  Patient Measurements: Height: 6\' 2"  (188 cm) Weight: 200 lb 6.4 oz (90.9 kg) IBW/kg (Calculated) : 82.2 Heparin Dosing Weight: 82.2 kg  Vital Signs: Temp: 98 F (36.7 C) (04/27 0731) Temp Source: Oral (04/27 0731) BP: 126/72 (04/27 0700) Pulse Rate: 100 (04/27 0400)  Labs: Recent Labs    09/08/17 0432  09/09/17 0357 09/09/17 1500 09/09/17 1554 09/10/17 0423  HGB 8.3*   < > 7.3*  --  8.5* 7.1*  HCT 25.7*   < > 23.1*  --  25.0* 21.8*  PLT 146*  --  159  --   --  217  LABPROT 18.7*  --  17.1*  --   --  19.0*  INR 1.58  --  1.41  --   --  1.61  HEPARINUNFRC  --   --   --  <0.10*  --  <0.10*  CREATININE 1.40*   < > 1.26*  --  1.20 1.21   < > = values in this interval not displayed.   Estimated Creatinine Clearance: 90.6 mL/min (by C-G formula based on SCr of 1.21 mg/dL).  Assessment: Jonathan Wood presented with worsening HF, now s/p LVAD on 4/23. Pharmacy consulted to dose heparin. Note specific goal heparin level of 0.3. Hgb low 7.1 with drainage from pocket drain> PRBC x1 again today, pltc 200s, LDH trending down 400s>300s . INR up trending 1.6   Goal of Therapy:  Heparin level 0.3 units/ml Monitor platelets by anticoagulation protocol: Yes   Plan:  No bolus per MD Increase heparin gtt to 100 units/hr - max rate for today as discussed with surgeon Check heparin level in AM Daily heparin level and CBC Monitor for s/sx of increase bleeding Warfarin 7.5mg  x1 today per MD  [name] Pharm.D. CPP, BCPS Clinical Pharmacist [phone] 09/10/2017 9:35 AM

## 2017-09-10 NOTE — Plan of Care (Signed)
  Problem: Health Behavior/Discharge Planning: Goal: Ability to manage health-related needs will improve Outcome: Progressing   Problem: Clinical Measurements: Goal: Ability to maintain clinical measurements within normal limits will improve Outcome: Progressing Goal: Will remain free from infection Outcome: Progressing Goal: Diagnostic test results will improve Outcome: Progressing Goal: Respiratory complications will improve Outcome: Progressing Goal: Cardiovascular complication will be avoided Outcome: Progressing   Problem: Safety: Goal: Ability to remain free from injury will improve Outcome: Progressing   Problem: Skin Integrity: Goal: Risk for impaired skin integrity will decrease Outcome: Progressing   Problem: Education: Goal: Knowledge of the prescribed therapeutic regimen will improve Outcome: Progressing   Problem: Activity: Goal: Risk for activity intolerance will decrease Outcome: Progressing   Problem: Cardiac: Goal: Ability to maintain an adequate cardiac output will improve Outcome: Progressing   Problem: Coping: Goal: Level of anxiety will decrease Outcome: Progressing Note:  MD added klonopin to scheduled medication, pt has experienced noted relief. Providing empathetic listening and caring presence to patient.   Problem: Fluid Volume: Goal: Risk for excess fluid volume will decrease Outcome: Progressing Note:  Patient on lasix gtt at 10mg /hr, diuresing well approx. 1500cc out from overnight shift.    Problem: Clinical Measurements: Goal: Ability to maintain clinical measurements within normal limits will improve Outcome: Progressing Goal: Will remain free from infection Outcome: Progressing   Problem: Respiratory: Goal: Will regain and/or maintain adequate ventilation Outcome: Progressing Note:  Patient maintains adequate O2 sats on 3.5L [place] with 4ppm nitric, managed by RT. Respiratory effort increases somewhat with breakthrough pain but  patient is easily coached into controlled breathing.

## 2017-09-11 ENCOUNTER — Inpatient Hospital Stay (HOSPITAL_COMMUNITY): Payer: Medicaid Other

## 2017-09-11 LAB — CBC WITH DIFFERENTIAL/PLATELET
Basophils Absolute: 0 10*3/uL (ref 0.0–0.1)
Basophils Relative: 0 %
Eosinophils Absolute: 0.2 10*3/uL (ref 0.0–0.7)
Eosinophils Relative: 1 %
HCT: 24.3 % — ABNORMAL LOW (ref 39.0–52.0)
Hemoglobin: 8 g/dL — ABNORMAL LOW (ref 13.0–17.0)
Lymphocytes Relative: 12 %
Lymphs Abs: 2.2 10*3/uL (ref 0.7–4.0)
MCH: 28.6 pg (ref 26.0–34.0)
MCHC: 32.9 g/dL (ref 30.0–36.0)
MCV: 86.8 fL (ref 78.0–100.0)
Monocytes Absolute: 2.4 10*3/uL — ABNORMAL HIGH (ref 0.1–1.0)
Monocytes Relative: 13 %
Neutro Abs: 13.7 10*3/uL — ABNORMAL HIGH (ref 1.7–7.7)
Neutrophils Relative %: 74 %
Platelets: 247 10*3/uL (ref 150–400)
RBC: 2.8 MIL/uL — ABNORMAL LOW (ref 4.22–5.81)
RDW: 14.2 % (ref 11.5–15.5)
WBC: 18.5 10*3/uL — ABNORMAL HIGH (ref 4.0–10.5)

## 2017-09-11 LAB — BASIC METABOLIC PANEL
ANION GAP: 11 (ref 5–15)
BUN: 26 mg/dL — AB (ref 6–20)
CHLORIDE: 90 mmol/L — AB (ref 101–111)
CO2: 29 mmol/L (ref 22–32)
Calcium: 8.8 mg/dL — ABNORMAL LOW (ref 8.9–10.3)
Creatinine, Ser: 1.24 mg/dL (ref 0.61–1.24)
GFR calc Af Amer: >60 mL/min (ref >60)
GFR calc non Af Amer: >60 mL/min (ref >60)
GLUCOSE: 132 mg/dL — AB (ref 65–99)
POTASSIUM: 3.8 mmol/L (ref 3.5–5.1)
Sodium: 130 mmol/L — ABNORMAL LOW (ref 135–145)

## 2017-09-11 LAB — COOXEMETRY PANEL
Carboxyhemoglobin: 1.5 % (ref 0.5–1.5)
Carboxyhemoglobin: 1.8 % — ABNORMAL HIGH (ref 0.5–1.5)
Carboxyhemoglobin: 1.9 % — ABNORMAL HIGH (ref 0.5–1.5)
Carboxyhemoglobin: 2 % — ABNORMAL HIGH (ref 0.5–1.5)
Carboxyhemoglobin: 1.5 % (ref 0.5–1.5)
Methemoglobin: 1.6 % — ABNORMAL HIGH (ref 0.0–1.5)
Methemoglobin: 1.6 % — ABNORMAL HIGH (ref 0.0–1.5)
Methemoglobin: 1.3 % (ref 0.0–1.5)
Methemoglobin: 0.9 % (ref 0.0–1.5)
Methemoglobin: 1.7 % — ABNORMAL HIGH (ref 0.0–1.5)
O2 Saturation: 37.2 %
O2 Saturation: 60.4 %
O2 Saturation: 56.3 %
O2 Saturation: 92.8 %
O2 Saturation: 53.4 %
TOTAL HEMOGLOBIN: 8.6 g/dL — AB (ref 12.0–16.0)
Total hemoglobin: 8.3 g/dL — ABNORMAL LOW (ref 12.0–16.0)
Total hemoglobin: 9.1 g/dL — ABNORMAL LOW (ref 12.0–16.0)
Total hemoglobin: 12.7 g/dL (ref 12.0–16.0)
Total hemoglobin: 9 g/dL — ABNORMAL LOW (ref 12.0–16.0)

## 2017-09-11 LAB — BPAM RBC
Blood Product Expiration Date: 201905302359
ISSUE DATE / TIME: 201904270912
Unit Type and Rh: 7300

## 2017-09-11 LAB — MAGNESIUM: Magnesium: 2.2 mg/dL (ref 1.7–2.4)

## 2017-09-11 LAB — PROTIME-INR
INR: 2.21
Prothrombin Time: 24.3 seconds — ABNORMAL HIGH (ref 11.4–15.2)

## 2017-09-11 LAB — TYPE AND SCREEN
ABO/RH(D): B POS
Antibody Screen: NEGATIVE
Unit division: 0

## 2017-09-11 LAB — GLUCOSE, CAPILLARY
GLUCOSE-CAPILLARY: 88 mg/dL (ref 65–99)
GLUCOSE-CAPILLARY: 106 mg/dL — AB (ref 65–99)
GLUCOSE-CAPILLARY: 135 mg/dL — AB (ref 65–99)
Glucose-Capillary: 90 mg/dL (ref 65–99)

## 2017-09-11 LAB — POCT I-STAT, CHEM 8
BUN: 29 mg/dL — AB (ref 6–20)
Calcium, Ion: 1.09 mmol/L — ABNORMAL LOW (ref 1.15–1.40)
Chloride: 85 mmol/L — ABNORMAL LOW (ref 101–111)
Creatinine, Ser: 1.1 mg/dL (ref 0.61–1.24)
Glucose, Bld: 163 mg/dL — ABNORMAL HIGH (ref 65–99)
HEMATOCRIT: 30 % — AB (ref 39.0–52.0)
HEMOGLOBIN: 10.2 g/dL — AB (ref 13.0–17.0)
POTASSIUM: 3.5 mmol/L (ref 3.5–5.1)
SODIUM: 130 mmol/L — AB (ref 135–145)
TCO2: 31 mmol/L (ref 22–32)

## 2017-09-11 LAB — HEPARIN LEVEL (UNFRACTIONATED): Heparin Unfractionated: 0.13 IU/mL — ABNORMAL LOW (ref 0.30–0.70)

## 2017-09-11 LAB — LACTATE DEHYDROGENASE: LDH: 360 U/L — ABNORMAL HIGH (ref 98–192)

## 2017-09-11 MED ORDER — SACUBITRIL-VALSARTAN 24-26 MG PO TABS
1.0000 | ORAL_TABLET | Freq: Two times a day (BID) | ORAL | Status: DC
  Administered 2017-09-11 – 2017-09-16 (×12): 1 via ORAL
  Filled 2017-09-11 (×13): qty 1

## 2017-09-11 MED ORDER — POTASSIUM CHLORIDE 10 MEQ/50ML IV SOLN
10.0000 meq | INTRAVENOUS | Status: AC
  Administered 2017-09-11 (×2): 10 meq via INTRAVENOUS
  Filled 2017-09-11 (×2): qty 50

## 2017-09-11 MED ORDER — METOLAZONE 2.5 MG PO TABS
2.5000 mg | ORAL_TABLET | Freq: Once | ORAL | Status: AC
  Administered 2017-09-11: 2.5 mg via ORAL
  Filled 2017-09-11: qty 1

## 2017-09-11 MED ORDER — GABAPENTIN 100 MG PO CAPS
100.0000 mg | ORAL_CAPSULE | Freq: Three times a day (TID) | ORAL | Status: DC
  Administered 2017-09-11 – 2017-09-19 (×25): 100 mg via ORAL
  Filled 2017-09-11 (×25): qty 1

## 2017-09-11 MED ORDER — ASPIRIN EC 81 MG PO TBEC
81.0000 mg | DELAYED_RELEASE_TABLET | Freq: Every day | ORAL | Status: DC
  Administered 2017-09-11 – 2017-09-19 (×9): 81 mg via ORAL
  Filled 2017-09-11 (×9): qty 1

## 2017-09-11 MED ORDER — FENTANYL CITRATE (PF) 100 MCG/2ML IJ SOLN
50.0000 ug | Freq: Once | INTRAMUSCULAR | Status: AC
  Administered 2017-09-11: 50 ug via INTRAVENOUS

## 2017-09-11 MED ORDER — FUROSEMIDE 10 MG/ML IJ SOLN
60.0000 mg | Freq: Two times a day (BID) | INTRAMUSCULAR | Status: DC
  Administered 2017-09-11: 60 mg via INTRAVENOUS
  Filled 2017-09-11: qty 6

## 2017-09-11 MED ORDER — ISOSORB DINITRATE-HYDRALAZINE 20-37.5 MG PO TABS
1.0000 | ORAL_TABLET | Freq: Two times a day (BID) | ORAL | Status: DC
  Administered 2017-09-11 – 2017-09-12 (×2): 1 via ORAL
  Filled 2017-09-11 (×2): qty 1

## 2017-09-11 MED ORDER — EPINEPHRINE PF 1 MG/ML IJ SOLN
1.0000 ug/min | INTRAVENOUS | Status: DC
  Filled 2017-09-11: qty 4

## 2017-09-11 MED ORDER — SALINE SPRAY 0.65 % NA SOLN
1.0000 | NASAL | Status: DC | PRN
  Administered 2017-09-11: 1 via NASAL
  Filled 2017-09-11: qty 44

## 2017-09-11 MED ORDER — FUROSEMIDE 10 MG/ML IJ SOLN
80.0000 mg | Freq: Two times a day (BID) | INTRAMUSCULAR | Status: DC
  Administered 2017-09-11 – 2017-09-12 (×3): 80 mg via INTRAVENOUS
  Filled 2017-09-11 (×3): qty 8

## 2017-09-11 MED ORDER — SODIUM CHLORIDE 0.9 % IV SOLN
510.0000 mg | Freq: Once | INTRAVENOUS | Status: AC
  Administered 2017-09-11: 510 mg via INTRAVENOUS
  Filled 2017-09-11: qty 17

## 2017-09-11 MED ORDER — MUSCLE RUB 10-15 % EX CREA
TOPICAL_CREAM | CUTANEOUS | Status: DC | PRN
  Administered 2017-09-11 – 2017-09-14 (×2): 1 via TOPICAL
  Filled 2017-09-11 (×2): qty 85

## 2017-09-11 MED ORDER — WARFARIN SODIUM 2 MG PO TABS
4.0000 mg | ORAL_TABLET | Freq: Once | ORAL | Status: AC
  Administered 2017-09-11: 4 mg via ORAL
  Filled 2017-09-11: qty 2

## 2017-09-11 MED ORDER — POTASSIUM CHLORIDE 10 MEQ/50ML IV SOLN
10.0000 meq | INTRAVENOUS | Status: AC
  Administered 2017-09-11 (×3): 10 meq via INTRAVENOUS
  Filled 2017-09-11 (×3): qty 50

## 2017-09-11 MED ORDER — POTASSIUM CHLORIDE 10 MEQ/50ML IV SOLN
10.0000 meq | INTRAVENOUS | Status: DC
Start: 2017-09-11 — End: 2017-09-11
  Administered 2017-09-11: 10 meq via INTRAVENOUS
  Filled 2017-09-11: qty 50

## 2017-09-11 MED ORDER — SODIUM CHLORIDE 0.9 % IV SOLN: 25.0000 mg | Freq: Once | INTRAVENOUS | Status: DC

## 2017-09-11 NOTE — Progress Notes (Signed)
Please note: re drawn co-ox was 60.

## 2017-09-11 NOTE — Progress Notes (Signed)
Advanced Heart Failure/VAD Rounding Note  PCP-Cardiologist: No primary care provider on file.   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   Extubated 09/07/17.  Coox 58.1% on Milrinone 0.4 and Epi 1. NE off. Confounded by anemia. Remains on Amio 30 mg/hr.   Pain improved over last 2 days.  Having lower abdominal pain, no BM yet. + small amounts of flatus, feels like he needs to move his bowels. Denies SOB.   Weight shows up 1 lb. - 700 cc. 2.5 L of UOP.   CVP 16-18 cm. Rhythm stable. Minimal drainage from CT.   INR 1.41. LDH 404 Hgb 8.3 -> 7.3. (Ordered for 1 unit)  LVAD Interrogation HM 3: Speed: 5400 Flow: 4.6 Power: 4.0 PI: 3.7. 2 PI events  Objective:   Weight Range: 201 lb 4.5 oz (91.3 kg) Body mass index is 25.84 kg/m.   Vital Signs:                 Temp:  [97.6 F (36.4 C)-98.6 F (37 C)] 97.9 F (36.6 C) (04/26 0800) Pulse Rate:  [90-101] 95 (04/26 0400) Resp:  [19-43] 25 (04/26 0700) BP: (76-115)/(56-97) 112/93 (04/26 0700) SpO2:  [3 %-100 %] 100 % (04/26 0700) Arterial Line BP: (89-117)/(68-95) 102/87 (04/26 0700) Weight:  [201 lb 4.5 oz (91.3 kg)] 201 lb 4.5 oz (91.3 kg) (04/26 0615) Last BM Date: 09/04/17  Weight change:      Filed Weights   09/07/17 0540 09/08/17 0415 09/09/17 0615  Weight: 195 lb 12.3 oz (88.8 kg) 200 lb 4.8 oz (90.9 kg) 201 lb 4.5 oz (91.3 kg)    Intake/Output:             Intake/Output Summary (Last 24 hours) at 09/09/2017 0813 Last data filed at 09/09/2017 0731    Gross per 24 hour  Intake 2198.43 ml  Output 2880 ml  Net -681.57 ml                 Physical Exam   GENERAL: Fatigued appearing. NAD.  HEENT: Normal. anicteric NECK: Supple, JVP to jaw cm. Carotids OK.  CARDIAC:  Mechanical heart sounds with LVAD hum present.  CTs minimal drainage  LUNGS:  CTAB, normal effort.  Dull at bases ABDOMEN:  NT, +distended, no HSM. No bruits or masses. +BS  LVAD exit site: Dressing dry and intact. No erythema or  drainage. Stabilization device present and accurately applied. Driveline dressing changed daily per sterile technique. EXTREMITIES:  Warm and dry. No cyanosis, clubbing, or rash. 1+ edema.  Neuro: alert & oriented x 3, cranial nerves grossly intact. moves all 4 extremities w/o difficulty. Affect pleasant   Telemetry   Sinus 80-90s, personally reviewed.   EKG    No new tracings.    Labs    CBC RecentLabs(last2labs)  Recent Labs    09/08/17 0432 09/08/17 1724 09/09/17 0357  WBC 25.3*  --  20.6*  NEUTROABS 20.5*  --  16.3*  HGB 8.3* 8.2* 7.3*  HCT 25.7* 24.0* 23.1*  MCV 89.5  --  88.8  PLT 146*  --  159     Basic Metabolic Panel RecentLabs(last2labs)       Recent Labs    09/08/17 0432 09/08/17 1724 09/09/17 0357  NA 133* 130* 128*  K 5.0 4.5 4.1  CL 101 97* 90*  CO2 21*  --  23  GLUCOSE 121* 184* 162*  BUN 22* 23* 22*  CREATININE 1.40* 1.20 1.26*  CALCIUM 8.9  --  8.7*  MG 2.3  --  1.9  PHOS 5.2*  --  4.4     Liver Function Tests RecentLabs(last2labs)  Recent Labs    09/08/17 0432 09/09/17 0357  AST 125* 62*  ALT 29 27  ALKPHOS 65 73  BILITOT 4.2* 1.7*  PROT 6.4* 6.7  ALBUMIN 3.0* 2.8*     RecentLabs(last2labs)  No results for input(s): LIPASE, AMYLASE in the last 72 hours.   Cardiac Enzymes RecentLabs(last2labs)  No results for input(s): CKTOTAL, CKMB, CKMBINDEX, TROPONINI in the last 72 hours.    BNP: BNP (last 3 results) RecentLabs(withinlast365days)  Recent Labs    01/28/17 1412 08/25/17 1337 09/07/17 0411  BNP 2,506.0* 2,510.0* 272.7*      ProBNP (last 3 results) RecentLabs(withinlast365days)  No results for input(s): PROBNP in the last 8760 hours.     D-Dimer RecentLabs(last2labs)  No results for input(s): DDIMER in the last 72 hours.   Hemoglobin A1C RecentLabs(last2labs)  No results for input(s): HGBA1C in the last 72 hours.   Fasting Lipid  Panel RecentLabs(last2labs)  No results for input(s): CHOL, HDL, LDLCALC, TRIG, CHOLHDL, LDLDIRECT in the last 72 hours.   Thyroid Function Tests  RecentLabs(last2labs)  No results for input(s): TSH, T4TOTAL, T3FREE, THYROIDAB in the last 72 hours.  Invalid input(s): FREET3    Other results:   Imaging     No results found.   Medications:     Scheduled Medications:  .  acetaminophen   1,000 mg  Oral  Q6H     Or   .  acetaminophen (TYLENOL) oral liquid 160 mg/5 mL   1,000 mg  Per Tube  Q6H   .  amiodarone   200 mg  Oral  BID   .  aspirin EC   325 mg  Oral  Daily     Or   .  aspirin   324 mg  Per Tube  Daily     Or   .  aspirin   300 mg  Rectal  Daily   .  bisacodyl   10 mg  Oral  Daily     Or   .  bisacodyl   10 mg  Rectal  Daily   .  chlorhexidine   15 mL  Mouth Rinse  BID   .  Chlorhexidine Gluconate Cloth   6 each  Topical  Daily   .  citalopram   10 mg  Oral  Daily   .  docusate sodium   200 mg  Oral  Daily   .  fentaNYL   75 mcg  Transdermal  Q72H   .  insulin aspart   0-24 Units  Subcutaneous  Q4H   .  insulin detemir   14 Units  Subcutaneous  BID   .  mouth rinse   15 mL  Mouth Rinse  q12n4p   .  metoCLOPramide (REGLAN) injection   10 mg  Intravenous  Q6H   .  pantoprazole   40 mg  Oral  Daily   .  sodium chloride flush   10-40 mL  Intracatheter  Q12H   .  sodium chloride flush   3 mL  Intravenous  Q12H   .  traZODone   50 mg  Oral  QHS   .  warfarin   5 mg  Oral  ONCE-1800   .  Warfarin - Physician Dosing Inpatient     Does not apply  q1800     Infusions:  .  sodium  chloride  Stopped (09/08/17 0800)   .  sodium chloride      .  sodium chloride  Stopped (09/08/17 2100)   .  EPINEPHrine 4 mg in dextrose 5% 250 mL infusion (16 mcg/mL)  1 mcg/min (09/09/17 0700)   .  furosemide (LASIX) infusion   10 mg/hr (09/09/17 0700)   .  heparin      .  lactated ringers  Stopped (09/06/17 1623)   .  lactated ringers  20 mL/hr at 09/09/17 0700   .  milrinone  0.4 mcg/kg/min (09/09/17 0700)   .  piperacillin-tazobactam (ZOSYN)  IV  3.375 g (09/09/17 0626)   .  vancomycin  1,250 mg (09/08/17 1700)     PRN Medications:  sodium chloride, ALPRAZolam, fentaNYL (SUBLIMAZE) injection, ipratropium-albuterol, ondansetron (ZOFRAN) IV, oxyCODONE, sodium chloride flush, sodium chloride flush, traMADol    Patient Profile   Ms Gilkey is 47 year old with a history of NICM, HTN, ETOH abuse, smoker, asthma, and chronic systolic heart failure.  Assessment/Plan   1. Cardiogenic Shock- NICM had LHC 2018 with normal cors.  - ECHO this admit EF 5-10%. RV moderately reduced.   - s/p HM 3 LVAD 09/06/17  - Coox 58.1% this am on Milrinone 0.4 and Epi 1. Amio at 30 mg/hr. Confounded by anemia.  - Volume status remains elevated. CVP 16-18 - Continue lasix gtt 10 mg/hr. Will get extra dose of 40 mg IV with blood product today.  - Extubated 09/07/17. - Smoker- Not a candidate for transplant. Needs 6 months off nicotine.  - Blood Type B+  - Will discuss sildenafil with MD with component of RV failure.  - Will give sorbitol for constipation.   2. Post op anemia - Hgb 7.3 this am. Ordered for 1 uPRBC  3. Shock Liver - Improved with hemodynamic support.   4. AKI  - Creatinine 1.1 -> 1.4 -> 1.26 with diuresis.  - Continue to follow closely.   5. Tobacco Abuse  - Encouraged complete cessation.  - Needs 6 months off for transplant consideration.   6. ETOH  - Encouraged complete cessation.   7. Gout Uric Acid 14.5 treated with 3 days of steroids. Now off. Now on allopurinol - Stable.   8. CAP - Finished course of doxycyline. Improved. .   9. Anxiety - Continue xanax and sertraline on 4/17.  - Improved on Celexa and Xanax.   I reviewed the LVAD parameters from  today, and compared the results to the patient's prior recorded data.  No programming changes were made.  The LVAD is functioning within specified parameters.  The patient performs LVAD self-test daily.  LVAD interrogation was negative for any significant power changes, alarms or PI events/speed drops.  LVAD equipment check completed and is in good working order.  Back-up equipment present.   LVAD education done on emergency procedures and precautions and reviewed exit site care.   Length of Stay: [address]  Graciella Freer, [place]  09/09/2017, 8:13 AM  Advanced Heart Failure Team Pager [phone] (M-F; 7a - 4p)  Please contact [facility] for night-coverage after hours (4p -7a ) and weekends on [email or web address]  Patient seen and examined with the above-signed Advanced Practice Provider and/or Housestaff. I personally reviewed laboratory data, imaging studies and relevant notes. I independently examined the patient and formulated the important aspects of the plan. I have edited the note to reflect any of my changes or salient points. I have personally discussed the plan with the patient and/or family.  POD #3 LVAD. Remains on milrinone and epi 1. Volume status still mildly elevated. Rhythm stable. Minimal drainage from CTs. Hgb low. Will continue inotropes at current dose. Continue diuresis. Pull MT per Dr. Donata Clay. Give 1u RBCs. Heparin started. On coumadin. Discussed dosing with PharmD personally.  Begin to mobilize  VAD interrogated personally. Parameters stable.    Arvilla Meres, MD  9:32 AM

## 2017-09-11 NOTE — Progress Notes (Signed)
Left abdominal VAD dressing removed and site care performed using sterile technique. Drive line exit site cleaned with Chlora prep applicators x 2, allowed to dry, and gauze dressing with Aquacel silver strip re-applied. Exit site unincorporated, two sutures intact, the velour is fully implanted at exit site. Small amount old bloody drainage with no redness, tenderness, foul odor or rash noted. Drive line anchor re-applied.    Jonathan Wood

## 2017-09-11 NOTE — Progress Notes (Signed)
Advanced Heart Failure/VAD Rounding Note  PCP-Cardiologist: No primary care provider on file.   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   Extubated 09/07/17.  Epi increased to 10mcg/min last night due to low co-ox. Improved. Now back down to 1.  Walked halls this am. Started to feeling a bit better. Remains on milrinone 0.4. Epi 1 Co-ox 56%. MAPs 95-110 - PRN hydralazine not helping. Diuresing well on lasix gtt. Got metolazone x1 yesterday. Weight down 5 pounds. (still up about 10 pounds from baseline). Pocket pain improving. Denies orthopnea or PND. Still anxious at times. CVP 12  LVAD Interrogation HM 3: Speed: 5400 Flow: 4.8 Power: 4.0 PI: 3.9. Personally reviewed   Objective:   Weight Range: 88.7 kg (195 lb 8.8 oz) Body mass index is 25.11 kg/m.   Vital Signs:   Temp:  [97.6 F (36.4 C)-98.4 F (36.9 C)] 97.9 F (36.6 C) (04/28 1243) Resp:  [18-37] 25 (04/28 1000) BP: (97-137)/(75-102) 115/100 (04/28 1000) SpO2:  [86 %-100 %] 100 % (04/28 1000) Weight:  [88.7 kg (195 lb 8.8 oz)] 88.7 kg (195 lb 8.8 oz) (04/28 0500) Last BM Date: 09/10/17  Weight change: Filed Weights   09/09/17 0615 09/10/17 0555 09/11/17 0500  Weight: 91.3 kg (201 lb 4.5 oz) 90.9 kg (200 lb 6.4 oz) 88.7 kg (195 lb 8.8 oz)    Intake/Output:   Intake/Output Summary (Last 24 hours) at 09/11/2017 1357 Last data filed at 09/11/2017 1300 Gross per 24 hour  Intake 3196.82 ml  Output 2715 ml  Net 481.82 ml      Physical Exam   General:  Lying in bed. NAD.  HEENT: normal  Neck: supple. RIJ TLC.  JVP to jaw  Carotids 2+ bilat; no bruits. No lymphadenopathy or thryomegaly appreciated. Cor: sternal wound ok LVAD hum.  Lungs: Clear. Abdomen: obese soft, nontender, non-distended. No hepatosplenomegaly. No bruits or masses. Good bowel sounds. Driveline site clean. Anchor in place.  Extremities: no cyanosis, clubbing, rash. Warm 1+ edema  Neuro: alert & oriented x 3. No focal deficits. Moves all 4  without problem    Telemetry   Sinus 90-100s, personally reviewed.   EKG    No new tracings.    Labs    CBC Recent Labs    09/10/17 0423 09/10/17 1710 09/11/17 0214  WBC 17.5*  --  18.5*  NEUTROABS 12.5*  --  13.7*  HGB 7.1* 8.8* 8.0*  HCT 21.8* 26.0* 24.3*  MCV 87.9  --  86.8  PLT 217  --  247   Basic Metabolic Panel Recent Labs    91/47/82 0357  09/10/17 0423 09/10/17 1710 09/11/17 0214  NA 128*   < > 130* 132* 130*  K 4.1   < > 3.3* 4.0 3.8  CL 90*   < > 92* 92* 90*  CO2 23  --  28  --  29  GLUCOSE 162*   < > 114* 115* 132*  BUN 22*   < > 23* 24* 26*  CREATININE 1.26*   < > 1.21 1.10 1.24  CALCIUM 8.7*  --  8.6*  --  8.8*  MG 1.9  --  2.1  --  2.2  PHOS 4.4  --  3.6  --   --    < > = values in this interval not displayed.   Liver Function Tests Recent Labs    09/09/17 0357  AST 62*  ALT 27  ALKPHOS 73  BILITOT 1.7*  PROT 6.7  ALBUMIN 2.8*   No results for input(s): LIPASE, AMYLASE in the last 72 hours. Cardiac Enzymes No results for input(s): CKTOTAL, CKMB, CKMBINDEX, TROPONINI in the last 72 hours.  BNP: BNP (last 3 results) Recent Labs    01/28/17 1412 08/25/17 1337 09/07/17 0411  BNP 2,506.0* 2,510.0* 272.7*    ProBNP (last 3 results) No results for input(s): PROBNP in the last 8760 hours.   D-Dimer No results for input(s): DDIMER in the last 72 hours. Hemoglobin A1C No results for input(s): HGBA1C in the last 72 hours. Fasting Lipid Panel No results for input(s): CHOL, HDL, LDLCALC, TRIG, CHOLHDL, LDLDIRECT in the last 72 hours. Thyroid Function Tests No results for input(s): TSH, T4TOTAL, T3FREE, THYROIDAB in the last 72 hours.  Invalid input(s): FREET3  Other results:   Imaging    Dg Chest Port 1 View  Result Date: 09/11/2017 CLINICAL DATA:  LVAD. EXAM: PORTABLE CHEST 1 VIEW COMPARISON:  September 10, 2017 FINDINGS: Stable LVAD location. Stable right IJ and PICC line. No pneumothorax. Cardiomegaly. No overt edema.  Retrocardiac opacity on the left is stable, possibly atelectasis. IMPRESSION: 1. Support apparatus as above.  No acute interval change. Electronically Signed   By: Gerome Sam III M.D   On: 09/11/2017 09:09     Medications:     Scheduled Medications: . acetaminophen  1,000 mg Oral Q6H   Or  . acetaminophen (TYLENOL) oral liquid 160 mg/5 mL  1,000 mg Per Tube Q6H  . amiodarone  200 mg Oral BID  . aspirin EC  81 mg Oral Daily  . chlorhexidine  15 mL Mouth Rinse BID  . Chlorhexidine Gluconate Cloth  6 each Topical Daily  . citalopram  10 mg Oral Daily  . clonazePAM  1 mg Oral QHS  . fentaNYL  75 mcg Transdermal Q72H  . furosemide  60 mg Intravenous BID  . gabapentin  100 mg Oral TID  . insulin aspart  0-15 Units Subcutaneous TID WC  . insulin aspart  0-5 Units Subcutaneous QHS  . insulin detemir  14 Units Subcutaneous BID  . mouth rinse  15 mL Mouth Rinse q12n4p  . metoCLOPramide (REGLAN) injection  10 mg Intravenous Q6H  . nutrition supplement (JUVEN)  1 packet Oral BID BM  . pantoprazole  40 mg Oral Daily  . senna-docusate  2 tablet Oral QHS  . sodium chloride flush  10-40 mL Intracatheter Q12H  . sodium chloride flush  3 mL Intravenous Q12H  . traZODone  50 mg Oral QHS  . warfarin  4 mg Oral ONCE-1800  . Warfarin - Physician Dosing Inpatient   Does not apply q1800    Infusions: . sodium chloride Stopped (09/08/17 0800)  . sodium chloride    . sodium chloride Stopped (09/08/17 2100)  . lactated ringers Stopped (09/06/17 1623)  . milrinone 0.4 mcg/kg/min (09/11/17 0912)  . piperacillin-tazobactam (ZOSYN)  IV 3.375 g (09/11/17 1301)    PRN Medications: sodium chloride, ALPRAZolam, fentaNYL (SUBLIMAZE) injection, hydrALAZINE, MUSCLE RUB, ondansetron (ZOFRAN) IV, oxyCODONE, sodium chloride, sodium chloride flush, sodium chloride flush, traMADol    Patient Profile   Jonathan Wood is 47 year old with a history of NICM, HTN, ETOH abuse, smoker, asthma, and chronic  systolic heart failure.   Assessment/Plan   1. Cardiogenic Shock- NICM had LHC 2018 with normal cors.  - ECHO this admit EF 5-10%. RV moderately reduced.   - s/p HM 3 LVAD 09/06/17   - Coox 56% this am on Milrinone 0.4. Epi 1.  Will continue today for RV support. Add sildenafil.  - Volume status improving. Still about 10 pounds up. Lasix gtt switched to 60 IV bid by Dr. Norina Buzzard. Diuresing well.  - Smoker- Not a candidate for transplant. Needs 6 months off nicotine.  - Blood Type B+  - Continue neurontin for pocket pain. Discussed dosing with PharmD personally. - LDH 360 - INR 2.21. Heparin stopped.  - Continue to mobilize - Continue IS - MAPs 95-110. Not responding well to prn hydralazine. Will add Entresto 24/26  2. Post op anemia - Got 1u RBCs yesterday. Hgb 8.0 this am.  3. Shock Liver - Improved with hemodynamic support.   4. AKI  - Creatinine stable at 1.2 - Continue to follow closely.   5. Hypokalemia - 3.8 will supp  6.  Tobacco Abuse  - Encouraged complete cessation.  - Needs 6 months off for transplant consideration.   7. ETOH  - Encouraged complete cessation.   8. Gout - Uric Acid 14.5 - Treated with 3 days of steroids. Now on allopurinol - Stable.   9. CAP - Finished abx  10. Anxiety - Continue xanax and sertraline on 4/17.  - Improved on Celexa and Xanax.   I reviewed the LVAD parameters from today, and compared the results to the patient's prior recorded data.  No programming changes were made.  The LVAD is functioning within specified parameters.  The patient performs LVAD self-test daily.  LVAD interrogation was negative for any significant power changes, alarms or PI events/speed drops.  LVAD equipment check completed and is in good working order.  Back-up equipment present.   LVAD education done on emergency procedures and precautions and reviewed exit site care.   Length of Stay: 17  Arvilla Meres, MD  1:57 PM   Advanced Heart Failure  Team Pager [phone] (M-F; 7a - 4p)  Please contact [facility] for night-coverage after hours (4p -7a ) and weekends on [email or web address]

## 2017-09-11 NOTE — Plan of Care (Signed)
Pt has been having BM and voiding via urinal s/p d/c foley this AM. Will continue to monitor. Will continue promoting ambulation. Problem: Elimination: Goal: Will not experience complications related to bowel motility Outcome: Completed/Met Goal: Will not experience complications related to urinary retention 09/11/2017 2030 by Henreitta Leber, RN Outcome: Completed/Met 09/11/2017 2029 by Henreitta Leber, RN Outcome: Progressing

## 2017-09-11 NOTE — Progress Notes (Signed)
Left abdominal VAD dressing removed and site care performed using sterile technique. Drive line exit site cleaned with Chlora prep applicators x 2, allowed to dry, and gauze dressing with Aquacel silver strip re-applied. Exit site unincorporated, two sutures intact, the velour is fully implanted at exit site. Small amount old bloody drainage with no redness, tenderness, foul odor or rash noted. Drive line anchor re-applied.

## 2017-09-11 NOTE — Progress Notes (Signed)
Pt ordered and now eating cheeseburger and fries despite strong education about the importance of a heart healthy, low sodium diet.

## 2017-09-11 NOTE — Progress Notes (Signed)
VAD pager paged. Molly, RN updated to this am co ox of 37.2 and made aware of pt ectopy. Pt vital signs stable otherwise. On HFNC 6 L. Kirt Boys advised to re draw co ox and page Dr. [name] at RN's discretion.  RN will re draw co ox and monitor pt.

## 2017-09-11 NOTE — Progress Notes (Signed)
HeartMate 3 Rounding Note postop day #5  Subjective:    47 year old male withAA nonischemic cardiomyopathy, admitted with recurrent heart failure and cardiogenic shock with preoperative double inotropes and balloon pump.  Preoperative moderate to severe RV dysfunction, no PFO on preoperative echo bubble study   implantation of HeartMate 3 left ventricular assist device April 23.  Preoperative intra-aortic balloon pump removed.  Postop HeartMate 3 implantation with stable hemodynamics and good VAD flow parameters and  pulsatility index..  RV function postop supported with low-dose epinephrine - norepinephrineoff.  Maintaining sinus rhythm.  Patient was extubated postop day 1  MAP running high- now Bidil entresto added  PA catheter removed on postop day 2 and patient mobilized to chair without difficulty.Now ambulating in hall  Patient has had significant issues with surgical pain, but now improving.   CVP measurements have been elevated 14-16 cm water and the patient is receiving Lasix  q 8 hrs transitioned from drip inhaled nitric oxide now off  Expected postop blood loss anemia hemoglobin8 - patient has received 2 units  PRBCs postop  Full liquid diet with fluid limit has been started LVAD INTERROGATION:  HeartMate 3 LVAD:  Flow 4.8 liters/min, speed 5400 RPM, power 4.2, PI 2.4.  Controller intact.   Objective:    Vital Signs:   Temp:  [97.6 F (36.4 C)-97.9 F (36.6 C)] 97.8 F (36.6 C) (04/28 1634) Resp:  [18-37] 25 (04/28 1500) BP: (97-137)/(75-104) 117/86 (04/28 1500) SpO2:  [86 %-100 %] 89 % (04/28 1500) Weight:  [195 lb 8.8 oz (88.7 kg)] 195 lb 8.8 oz (88.7 kg) (04/28 0500) Last BM Date: 09/10/17 Mean arterial Pressure 75--90 mmHg mean arterial pressure  Intake/Output:   Intake/Output Summary (Last 24 hours) at 09/11/2017 1731 Last data filed at 09/11/2017 1500 Gross per 24 hour  Intake 2041.52 ml  Output 3805 ml  Net -1763.48 ml     Physical Exam: General:   Well appearing. No resp difficulty.  Breathing comfortably extubated HEENT: normal Neck: supple. JVP . Carotids 2+ bilat; no bruits. No lymphadenopathy or thryomegaly appreciated. Cor: Mechanical heart sounds with LVAD hum present. Lungs: clear Abdomen: soft, nontender, nondistended. No hepatosplenomegaly. No bruits or masses. Good bowel sounds. Extremities: no cyanosis, clubbing, rash, edema Neuro: alert & orientedx3, cranial nerves grossly intact. moves all 4 extremities w/o difficulty. Affect pleasant  Telemetry: Sinus rhythm  Labs: Basic Metabolic Panel: Recent Labs  Lab 09/07/17 0411 09/07/17 1725  09/08/17 0432  09/09/17 0357 09/09/17 1554 09/10/17 0423 09/10/17 1710 09/11/17 0214  NA 138 134*   < > 133*   < > 128* 132* 130* 132* 130*  K 5.1 4.8   < > 5.0   < > 4.1 3.9 3.3* 4.0 3.8  CL 106 104   < > 101   < > 90* 92* 92* 92* 90*  CO2 27 21*  --  21*  --  23  --  28  --  29  GLUCOSE 111* 124*   < > 121*   < > 162* 130* 114* 115* 132*  BUN 14 17   < > 22*   < > 22* 24* 23* 24* 26*  CREATININE 1.11 1.27*   < > 1.40*   < > 1.26* 1.20 1.21 1.10 1.24  CALCIUM 8.6* 8.8*  --  8.9  --  8.7*  --  8.6*  --  8.8*  MG 2.6* 2.6*  --  2.3  --  1.9  --  2.1  --  2.2  PHOS 3.7  --   --  5.2*  --  4.4  --  3.6  --   --    < > = values in this interval not displayed.    Liver Function Tests: Recent Labs  Lab 09/05/17 0359 09/06/17 0430 09/07/17 0411 09/08/17 0432 09/09/17 0357  AST 22 24 160* 125* 62*  ALT 39 36 31 29 27   ALKPHOS 82 76 59 65 73  BILITOT 0.9 1.3* 3.9* 4.2* 1.7*  PROT 6.9 7.0 6.5 6.4* 6.7  ALBUMIN 2.9* 3.0* 3.4* 3.0* 2.8*   No results for input(s): LIPASE, AMYLASE in the last 168 hours. No results for input(s): AMMONIA in the last 168 hours.  CBC: Recent Labs  Lab 09/07/17 0411 09/07/17 1725  09/08/17 0432  09/09/17 0357 09/09/17 1554 09/10/17 0423 09/10/17 1710 09/11/17 0214  WBC 17.4* 24.9*  --  25.3*  --  20.6*  --  17.5*  --  18.5*  NEUTROABS  14.0*  --   --  20.5*  --  16.3*  --  12.5*  --  13.7*  HGB 8.6* 8.2*   < > 8.3*   < > 7.3* 8.5* 7.1* 8.8* 8.0*  HCT 26.8* 25.5*   < > 25.7*   < > 23.1* 25.0* 21.8* 26.0* 24.3*  MCV 88.2 88.9  --  89.5  --  88.8  --  87.9  --  86.8  PLT 155 140*  --  146*  --  159  --  217  --  247   < > = values in this interval not displayed.    INR: Recent Labs  Lab 09/07/17 0411 09/08/17 0432 09/09/17 0357 09/10/17 0423 09/11/17 0214  INR 1.38 1.58 1.41 1.61 2.21    Other results:  EKG:   Imaging: Dg Chest Port 1 View  Result Date: 09/11/2017 CLINICAL DATA:  LVAD. EXAM: PORTABLE CHEST 1 VIEW COMPARISON:  September 10, 2017 FINDINGS: Stable LVAD location. Stable right IJ and PICC line. No pneumothorax. Cardiomegaly. No overt edema. Retrocardiac opacity on the left is stable, possibly atelectasis. IMPRESSION: 1. Support apparatus as above.  No acute interval change. Electronically Signed   By: Gerome Sam III M.D   On: 09/11/2017 09:09   Dg Chest Port 1 View  Result Date: 09/10/2017 CLINICAL DATA:  LVAD in place. EXAM: PORTABLE CHEST 1 VIEW COMPARISON:  Chest x-ray from yesterday. FINDINGS: Unchanged right internal jugular central venous catheter and right PICC line. Stable cardiomegaly with partial visualization of the LVAD. Pulmonary vascular congestion is unchanged. Unchanged left greater than right basilar atelectasis with probable small left pleural effusion. No pneumothorax. No acute osseous abnormality. IMPRESSION: 1. Stable chest. Unchanged bibasilar atelectasis and probable small left pleural effusion. Electronically Signed   By: Obie Dredge M.D.   On: 09/10/2017 07:23     Medications:     Scheduled Medications: . acetaminophen  1,000 mg Oral Q6H   Or  . acetaminophen (TYLENOL) oral liquid 160 mg/5 mL  1,000 mg Per Tube Q6H  . amiodarone  200 mg Oral BID  . aspirin EC  81 mg Oral Daily  . chlorhexidine  15 mL Mouth Rinse BID  . Chlorhexidine Gluconate Cloth  6 each Topical  Daily  . citalopram  10 mg Oral Daily  . clonazePAM  1 mg Oral QHS  . fentaNYL  75 mcg Transdermal Q72H  . fentaNYL (SUBLIMAZE) injection  50 mcg Intravenous Once  . furosemide  80 mg Intravenous BID  . gabapentin  100 mg Oral TID  . insulin aspart  0-15 Units Subcutaneous TID WC  . insulin aspart  0-5 Units Subcutaneous QHS  . insulin detemir  14 Units Subcutaneous BID  . isosorbide-hydrALAZINE  1 tablet Oral BID  . mouth rinse  15 mL Mouth Rinse q12n4p  . metoCLOPramide (REGLAN) injection  10 mg Intravenous Q6H  . metolazone  2.5 mg Oral Once  . nutrition supplement (JUVEN)  1 packet Oral BID BM  . pantoprazole  40 mg Oral Daily  . sacubitril-valsartan  1 tablet Oral BID  . senna-docusate  2 tablet Oral QHS  . sodium chloride flush  10-40 mL Intracatheter Q12H  . sodium chloride flush  3 mL Intravenous Q12H  . traZODone  50 mg Oral QHS  . warfarin  4 mg Oral ONCE-1800  . Warfarin - Physician Dosing Inpatient   Does not apply q1800    Infusions: . sodium chloride Stopped (09/08/17 0800)  . sodium chloride    . sodium chloride Stopped (09/08/17 2100)  . epinephrine    . lactated ringers Stopped (09/06/17 1623)  . milrinone 0.4 mcg/kg/min (09/11/17 0912)  . piperacillin-tazobactam (ZOSYN)  IV Stopped (09/11/17 1701)    PRN Medications: sodium chloride, ALPRAZolam, fentaNYL (SUBLIMAZE) injection, MUSCLE RUB, ondansetron (ZOFRAN) IV, oxyCODONE, sodium chloride, sodium chloride flush, sodium chloride flush, traMADol   Assessment:  Nonischemic cardiomyopathy, cardiogenic shock Implantation of HeartMate 3 left ventricular assist device April 23 Stable LVAD flow parameters and hemodynamics on low-dose epinephrine  Preoperative and postoperative RV dysfunction requiring inotropic support  Plan/Discussion:   Patient progressing, ambulated > 200 ft feet with physical therapy.  Maintaining sinus rhythm Postop RV dysfunction requiring IV milrinone - CVP   remains 16-18 MAP  elevated- titrating meds  VAD parameters remain satisfactory INR 2.2 and heparin bridge is off  I reviewed the LVAD parameters from today, and compared the results to the patient's prior recorded data.  No programming changes were made.  The LVAD is functioning within specified parameters.  The patient performs LVAD self-test daily.  LVAD interrogation was negative for any significant power changes, alarms or PI events/speed drops.  LVAD equipment check completed and is in good working order.  Back-up equipment present.   LVAD education done on emergency procedures and precautions and reviewed exit site care.  Length of Stay: [address]  Kathlee Nations [place] III 09/11/2017, 5:31 PM

## 2017-09-11 NOTE — Progress Notes (Signed)
Subjective:  Denies any chest pain or shortness of breath states that her anxious today.  Objective:  Vital Signs in the last 24 hours: Temp:  [97.6 F (36.4 C)-98.4 F (36.9 C)] 97.6 F (36.4 C) (04/28 0759) Resp:  [17-37] 25 (04/28 1000) BP: (97-137)/(71-105) 115/100 (04/28 1000) SpO2:  [86 %-100 %] 100 % (04/28 1000) Weight:  [88.7 kg (195 lb 8.8 oz)] 88.7 kg (195 lb 8.8 oz) (04/28 0500)  Intake/Output from previous day: 04/27 0701 - 04/28 0700 In: 2816.2 [P.O.:840; I.V.:813.7; Blood:362.5; IV Piggyback:800] Out: 3345 [Urine:3245; Chest Tube:100] Intake/Output from this shift: No intake/output data recorded.  Physical Exam: Neck: no adenopathy, no carotid bruit, no JVD and supple, symmetrical, trachea midline Lungs: Clear anterolaterally Heart: regular rate and rhythm and LVAD hum noted Abdomen: soft, non-tender; bowel sounds normal; no masses,  no organomegaly Extremities: extremities normal, atraumatic, no cyanosis or edema  Lab Results: Recent Labs    09/10/17 0423 09/10/17 1710 09/11/17 0214  WBC 17.5*  --  18.5*  HGB 7.1* 8.8* 8.0*  PLT 217  --  247   Recent Labs    09/10/17 0423 09/10/17 1710 09/11/17 0214  NA 130* 132* 130*  K 3.3* 4.0 3.8  CL 92* 92* 90*  CO2 28  --  29  GLUCOSE 114* 115* 132*  BUN 23* 24* 26*  CREATININE 1.21 1.10 1.24   No results for input(s): TROPONINI in the last 72 hours.  Invalid input(s): CK, MB Hepatic Function Panel Recent Labs    09/09/17 0357  PROT 6.7  ALBUMIN 2.8*  AST 62*  ALT 27  ALKPHOS 73  BILITOT 1.7*   No results for input(s): CHOL in the last 72 hours. No results for input(s): PROTIME in the last 72 hours.  Imaging: Imaging results have been reviewed and Dg Chest Port 1 View  Result Date: 09/11/2017 CLINICAL DATA:  LVAD. EXAM: PORTABLE CHEST 1 VIEW COMPARISON:  September 10, 2017 FINDINGS: Stable LVAD location. Stable right IJ and PICC line. No pneumothorax. Cardiomegaly. No overt edema. Retrocardiac  opacity on the left is stable, possibly atelectasis. IMPRESSION: 1. Support apparatus as above.  No acute interval change. Electronically Signed   By: Gerome Sam III M.D   On: 09/11/2017 09:09   Dg Chest Port 1 View  Result Date: 09/10/2017 CLINICAL DATA:  LVAD in place. EXAM: PORTABLE CHEST 1 VIEW COMPARISON:  Chest x-ray from yesterday. FINDINGS: Unchanged right internal jugular central venous catheter and right PICC line. Stable cardiomegaly with partial visualization of the LVAD. Pulmonary vascular congestion is unchanged. Unchanged left greater than right basilar atelectasis with probable small left pleural effusion. No pneumothorax. No acute osseous abnormality. IMPRESSION: 1. Stable chest. Unchanged bibasilar atelectasis and probable small left pleural effusion. Electronically Signed   By: Obie Dredge M.D.   On: 09/10/2017 07:23    Cardiac Studies:  Assessment/Plan:  Severe nonischemic dilated cardiomyopathy/status post cardiogenic shock status post implantation of a HeartMate 3 LVAD assist device doing well Hypertension Chronic kidney disease Postop anemia History of EtOH abuse Tobacco abuse Hypokalemia Anxiety disorder Leukocytosis Plan Continue present management Dr. Algie Coffer to follow from tomorrow   LOS: 17 days    Rinaldo Cloud 09/11/2017, 10:49 AM

## 2017-09-11 NOTE — Progress Notes (Signed)
Left abdominal VAD dressing removed and site care performed using sterile technique. Drive line exit site cleaned with Chlora prep applicators x 2, allowed to dry, and gauze dressing with Aquacel silver strip re-applied. Exit site unincorporated, two sutures intact, the velour is fully implanted at exit site. Small amount old bloody drainage with no redness, tenderness, foul odor or rash noted. Drive line anchor re-applied.    Jonathan Wood M  

## 2017-09-12 ENCOUNTER — Inpatient Hospital Stay (HOSPITAL_COMMUNITY): Payer: Medicaid Other

## 2017-09-12 ENCOUNTER — Encounter (HOSPITAL_COMMUNITY): Payer: Self-pay | Admitting: Cardiothoracic Surgery

## 2017-09-12 LAB — COMPREHENSIVE METABOLIC PANEL
ALT: 18 U/L (ref 17–63)
AST: 29 U/L (ref 15–41)
Albumin: 2.5 g/dL — ABNORMAL LOW (ref 3.5–5.0)
Alkaline Phosphatase: 78 U/L (ref 38–126)
Anion gap: 9 (ref 5–15)
BUN: 27 mg/dL — ABNORMAL HIGH (ref 6–20)
CO2: 35 mmol/L — ABNORMAL HIGH (ref 22–32)
Calcium: 9 mg/dL (ref 8.9–10.3)
Chloride: 87 mmol/L — ABNORMAL LOW (ref 101–111)
Creatinine, Ser: 1.05 mg/dL (ref 0.61–1.24)
GFR calc Af Amer: >60 mL/min (ref >60)
GFR calc non Af Amer: >60 mL/min (ref >60)
Glucose, Bld: 167 mg/dL — ABNORMAL HIGH (ref 65–99)
Potassium: 3.6 mmol/L (ref 3.5–5.1)
Sodium: 131 mmol/L — ABNORMAL LOW (ref 135–145)
Total Bilirubin: 1.2 mg/dL (ref 0.3–1.2)
Total Protein: 7.5 g/dL (ref 6.5–8.1)

## 2017-09-12 LAB — CBC WITH DIFFERENTIAL/PLATELET
Basophils Absolute: 0.2 10*3/uL — ABNORMAL HIGH (ref 0.0–0.1)
Basophils Relative: 1 %
Eosinophils Absolute: 0.2 10*3/uL (ref 0.0–0.7)
Eosinophils Relative: 1 %
HCT: 27.3 % — ABNORMAL LOW (ref 39.0–52.0)
Hemoglobin: 8.8 g/dL — ABNORMAL LOW (ref 13.0–17.0)
Lymphocytes Relative: 10 %
Lymphs Abs: 1.8 10*3/uL (ref 0.7–4.0)
MCH: 28.1 pg (ref 26.0–34.0)
MCHC: 32.2 g/dL (ref 30.0–36.0)
MCV: 87.2 fL (ref 78.0–100.0)
Monocytes Absolute: 2.4 10*3/uL — ABNORMAL HIGH (ref 0.1–1.0)
Monocytes Relative: 13 %
Neutro Abs: 13.5 10*3/uL — ABNORMAL HIGH (ref 1.7–7.7)
Neutrophils Relative %: 75 %
Platelets: 338 10*3/uL (ref 150–400)
RBC: 3.13 MIL/uL — ABNORMAL LOW (ref 4.22–5.81)
RDW: 14.6 % (ref 11.5–15.5)
WBC: 18.1 10*3/uL — ABNORMAL HIGH (ref 4.0–10.5)

## 2017-09-12 LAB — POCT I-STAT, CHEM 8
BUN: 26 mg/dL — ABNORMAL HIGH (ref 6–20)
CALCIUM ION: 1.17 mmol/L (ref 1.15–1.40)
Chloride: 84 mmol/L — ABNORMAL LOW (ref 101–111)
Creatinine, Ser: 1 mg/dL (ref 0.61–1.24)
GLUCOSE: 139 mg/dL — AB (ref 65–99)
HCT: 31 % — ABNORMAL LOW (ref 39.0–52.0)
HEMOGLOBIN: 10.5 g/dL — AB (ref 13.0–17.0)
Potassium: 3.9 mmol/L (ref 3.5–5.1)
Sodium: 127 mmol/L — ABNORMAL LOW (ref 135–145)
TCO2: 32 mmol/L (ref 22–32)

## 2017-09-12 LAB — GLUCOSE, CAPILLARY
Glucose-Capillary: 133 mg/dL — ABNORMAL HIGH (ref 65–99)
Glucose-Capillary: 113 mg/dL — ABNORMAL HIGH (ref 65–99)
Glucose-Capillary: 120 mg/dL — ABNORMAL HIGH (ref 65–99)
Glucose-Capillary: 87 mg/dL (ref 65–99)

## 2017-09-12 LAB — PROTIME-INR
INR: 2.13
Prothrombin Time: 23.6 seconds — ABNORMAL HIGH (ref 11.4–15.2)

## 2017-09-12 LAB — COOXEMETRY PANEL
Carboxyhemoglobin: 1.6 % — ABNORMAL HIGH (ref 0.5–1.5)
Carboxyhemoglobin: 1.9 % — ABNORMAL HIGH (ref 0.5–1.5)
Methemoglobin: 1.6 % — ABNORMAL HIGH (ref 0.0–1.5)
Methemoglobin: 1 % (ref 0.0–1.5)
O2 Saturation: 54.3 %
O2 Saturation: 46.3 %
Total hemoglobin: 10 g/dL — ABNORMAL LOW (ref 12.0–16.0)
Total hemoglobin: 10.1 g/dL — ABNORMAL LOW (ref 12.0–16.0)

## 2017-09-12 LAB — LACTATE DEHYDROGENASE: LDH: 353 U/L — ABNORMAL HIGH (ref 98–192)

## 2017-09-12 LAB — MAGNESIUM: Magnesium: 2.3 mg/dL (ref 1.7–2.4)

## 2017-09-12 MED ORDER — METOLAZONE 2.5 MG PO TABS
2.5000 mg | ORAL_TABLET | Freq: Once | ORAL | Status: AC
  Administered 2017-09-12: 2.5 mg via ORAL
  Filled 2017-09-12: qty 1

## 2017-09-12 MED ORDER — WARFARIN SODIUM 5 MG PO TABS
5.0000 mg | ORAL_TABLET | Freq: Every day | ORAL | Status: AC
  Administered 2017-09-12: 5 mg via ORAL
  Filled 2017-09-12: qty 1

## 2017-09-12 MED ORDER — PERFLUTREN LIPID MICROSPHERE
INTRAVENOUS | Status: AC
  Filled 2017-09-12: qty 10

## 2017-09-12 MED ORDER — POTASSIUM CHLORIDE 10 MEQ/50ML IV SOLN
10.0000 meq | INTRAVENOUS | Status: AC
Start: 2017-09-12 — End: 2017-09-12
  Administered 2017-09-12 (×2): 10 meq via INTRAVENOUS
  Filled 2017-09-12 (×2): qty 50

## 2017-09-12 MED ORDER — SILDENAFIL CITRATE 20 MG PO TABS
20.0000 mg | ORAL_TABLET | Freq: Three times a day (TID) | ORAL | Status: DC
  Administered 2017-09-12 – 2017-09-19 (×23): 20 mg via ORAL
  Filled 2017-09-12 (×23): qty 1

## 2017-09-12 MED ORDER — METOLAZONE 5 MG PO TABS: 5.0000 mg | ORAL_TABLET | Freq: Once | ORAL | Status: DC

## 2017-09-12 NOTE — Progress Notes (Signed)
CSW met with patient at bedside. Patient reports he just walked the unit and I'm tired". Patient struggling to keep eyes open sitting up in chair but states he is determined to stay in the chair until lunch arrives. Patient reports that [facility] at 2pm has rescheduled for Wednesday at 2pm. Patient appears to be adjusting to VAD life although states he is struggling with the pain but "it's improving". CSW provided supportive intervention and will continue to follow throughout implant hospitalization. Jackie , LCSW, CCSW-MCS [phone]  

## 2017-09-12 NOTE — Progress Notes (Signed)
LVAD Coordinator Rounding Note:  Admitted 08/25/17 due to CHF. Dr. [name] consulted for Advanced Heart Failure. VAD evaluation completed.   HM III LVAD implanted on 09/07/17 by 09/06/17 under Destination Therapy criteria due to current smoking history.  Pt sitting up in chair, dozing. No complaints this am. Pt states that he has already walked around the unit.   Vital signs: Temp:  98.8 HR: 99 Auto cuff: 100/69 (79) Doppler: 88 O2 Sat: 99% on 4 L/[place] Wt: 189>195>200>201>194 lbs    LVAD interrogation reveals:  Speed: 5400 Flow: 5.0 Power: 4.1 PI:  3.9 Alarms: none Events: 1 PI Hematocrit:  27 Fixed speed: 5400 Low speed limit: 5100  Drive Line:  Existing VAD dressing removed and site care performed using sterile technique, Kristy-pts caregiver observing.  Drive line exit site cleaned with Chlora prep applicators x 2, allowed to dry, and gauze dressing with silver strip re-applied. Exit site healing and unincorporated, the velour is fully implanted at exit site. Single stitch intact. No redness, tenderness, scant amount of sanguineous drainage, no foul odor or rash noted. Drive line anchor re-applied.         Labs:  LDH trend: [phone]  INR trend: 1.38>1.58>1.41>1.05  Hgb trend: 8.5>8.2>8.3>7.3>8.8  Co-ox trend: 68>76>59>58>54   Anticoagulation Plan: -INR Goal: 2.0 - 2.5 -ASA Dose: 325 mg daily until therapeutic INR  Blood Products:  Intra op: - 1 unit plts - 2 FFP - 250 cell saver  Post-op: 1 unit PRBC-09/10/17  Device: N/A  NO: 4 ppm  Gtts: - Levo>off 4/26 - Milrinone .4 mcg/kg/min - Epi 1 mcg/min off 4/28 - Amio 30 mg/hr off 4/26 - Lasix 10 mg/hr off 4/28  Arrhythmias: - Sinus tach 09/07/17 in 130's; started amiodarone (no bolus) trying to prevent post-op Afib  VAD Education:  Delivered HM III Patient Handbook to patient and asked him to read.  - Caregiver observed dressing change today  Adverse Events on  VAD: -none  Plan/Recommendations:  1. Daily dressing change per Nurse Alla Feeling or VAD Coordinator. Will meet Leedey tomorrow around [phone] pm to observe her changing the dressing.  2. Please call VAD pager if any VAD equipment or drive line issues.  Carlton Adam RN, VAD Coordinator 24/7 VAD pager: [phone]

## 2017-09-12 NOTE — Progress Notes (Signed)
This RN observed pt demonstrating switching to battery packs prior to walk then back to wall power after getting back to room, UTC. Able to switch safely with minimal cues. Able to demonstrate how to check if each battery is charged. Able to demonstrate self-test.   Continued education regarding heart healthy, low sodium, fluid restriction.

## 2017-09-12 NOTE — Progress Notes (Addendum)
Occupational Therapy Treatment Patient Details Name: Jonathan Wood MRN: [id number] DOB: August 02, 1970 Today's Date: 09/12/2017    History of present illness The patient is a 47 year old African American male, who presented to the hospital with fatigue, edema, dizziness, and cardiogenic shock. 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 Extubated 4/24. PMHx: Asthma, CHF, GERD, and History of hiatal hernia.   OT comments  Pt making steady progress. Difficulty switching to battery power due to safety/correct sequencing and physically manipulating connectors. VSS during session. Complaining of pain in "L flank" pain - nsg aware. Beginning education on ADL retraining. Sternal precaution handout given and reviewed. Will continue to follow.   Follow Up Recommendations  No OT follow up;Supervision/Assistance - 24 hour(initially)    Equipment Recommendations  3 in 1 bedside commode;Other (comment)    Recommendations for Other Services      Precautions / Restrictions Precautions Precautions: Sternal Precaution Booklet Issued: No Precaution Comments: LVAD, educated on sternal precautions, pt with poor carry over due to lethargy Restrictions Weight Bearing Restrictions: Yes       Mobility Bed Mobility               General bed mobility comments: pt up in chair upon PT arrival  Transfers Overall transfer level: Needs assistance Equipment used: Rolling walker (2 wheeled) Transfers: Sit to/from Stand Sit to Stand: Min guard;+2 safety/equipment         General transfer comment: pt able to get up with hands on bilat knees from low surface height without difficulty    Balance Overall balance assessment: Mild deficits observed, not formally tested                                         ADL either performed or assessed with clinical judgement   ADL Overall ADL's : Needs assistance/impaired                     Lower Body Dressing: Maximal assistance Lower Body  Dressing Details (indicate cue type and reason): Able to cross feet  over knees       Toileting - Clothing Manipulation Details (indicate cue type and reason): using urinal independently       General ADL Comments: Educattion on sternal precautions and implications for ADL; handout reviewed. Pt continues to require mod vc to adhere to precuations; mod vc for correct connection sequence for battery/power switch; educated on need to have lines organized prior to strating to switch     Vision       Perception     Praxis      Cognition Arousal/Alertness: Administrator, Civil Service;Suspect due to medications Behavior During Therapy: Flat affect Overall Cognitive Status: Impaired/Different from baseline Area of Impairment: Problem solving;Attention                   Current Attention Level: Selective         Problem Solving: Slow processing;Difficulty sequencing;Requires verbal cues;Requires tactile cues General Comments: pt tried to connect power cord to battery without clips. max directional v/c's to complete transfer from wall power to battery power and back to wall power. Pt with no carry over of sternal precautions.        Exercises Exercises: Other exercises Other Exercises Other Exercises: given bathcloth to twist/squeeze to address pinch and grip strength   Shoulder Instructions       General Comments VSS throught  Pertinent Vitals/ Pain       Pain Assessment: 0-10 Pain Score: 7  Pain Location: L flank Pain Descriptors / Indicators: Discomfort;Sore;Grimacing Pain Intervention(s): Limited activity within patient's tolerance;Patient requesting pain meds-RN notified;RN gave pain meds during session  Home Living                                          Prior Functioning/Environment              Frequency  Min 3X/week        Progress Toward Goals  OT Goals(current goals can now be found in the care plan section)  Progress towards OT  goals: Progressing toward goals  Acute Rehab OT Goals Patient Stated Goal: go home OT Goal Formulation: With patient Time For Goal Achievement: 09/23/17 Potential to Achieve Goals: Good ADL Goals Pt Will Perform Grooming: with modified independence;standing Pt Will Perform Upper Body Dressing: with min guard assist;sitting;with caregiver independent in assisting Pt Will Perform Lower Body Dressing: with modified independence;sit to/from stand Pt Will Transfer to Toilet: with modified independence;ambulating Pt Will Perform Toileting - Clothing Manipulation and hygiene: with modified independence;with adaptive equipment;sit to/from stand Additional ADL Goal #1: Pt will perform bed mobility at mod I level maintaining sternal precautions prior to engaging in ADL activity Additional ADL Goal #2: Pt will change power source for VAD at indpendent level prior to engaging in ADL activity  Plan Discharge plan remains appropriate    Co-evaluation    PT/OT/SLP Co-Evaluation/Treatment: Yes Reason for Co-Treatment: Complexity of the patient's impairments (multi-system involvement);For patient/therapist safety PT goals addressed during session: Mobility/safety with mobility OT goals addressed during session: ADL's and self-care      AM-PAC PT "6 Clicks" Daily Activity     Outcome Measure   Help from another person eating meals?: A Little Help from another person taking care of personal grooming?: A Little Help from another person toileting, which includes using toliet, bedpan, or urinal?: A Lot Help from another person bathing (including washing, rinsing, drying)?: A Lot Help from another person to put on and taking off regular upper body clothing?: A Lot Help from another person to put on and taking off regular lower body clothing?: A Lot 6 Click Score: 14    End of Session Equipment Utilized During Treatment: Rolling walker;Oxygen;Other (comment)(LVAD equipment/LVAD black bag)  OT Visit  Diagnosis: Pain;Unsteadiness on feet (R26.81);Other (comment) Pain - Right/Left: Left Pain - part of body: (flank)   Activity Tolerance Patient tolerated treatment well   Patient Left in chair;with call bell/phone within reach   Nurse Communication Mobility status        Time: 1610-9604 OT Time Calculation (min): 33 min  Charges: OT General Charges $OT Visit: 1 Visit OT Treatments $Self Care/Home Management : 8-22 mins  [facility], OT/L  [phone] 09/12/2017   Laney Louderback,HILLARY 09/12/2017, 1:19 PM

## 2017-09-12 NOTE — Progress Notes (Addendum)
Advanced Heart Failure/VAD Rounding Note  PCP-Cardiologist: No primary care provider on file.   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   Extubated 09/07/17.  Continues to diurese with 80 mg IV lasix twice a day. Weight down 1 pound. Yesterday he entresto and bidil started. Maps 80s.   Epi stopped this morning. Remains on milrinone 0.4 mcg. Co-ox 54%  Denies SOB, orthopnea or PND. Chest still sore. +BM  LVAD Interrogation HM 3: Speed: 5400 Flow: 5 Power: 4.0 PI: 3.1. Rare PI events.    Objective:   Weight Range: 194 lb 7.1 oz (88.2 kg) Body mass index is 24.97 kg/m.   Vital Signs:   Temp:  [97.6 F (36.4 C)-99.4 F (37.4 C)] 98.6 F (37 C) (04/29 0330) Pulse Rate:  [101-112] 109 (04/29 0500) Resp:  [14-32] 14 (04/29 0700) BP: (82-128)/(59-104) 94/59 (04/29 0200) SpO2:  [87 %-100 %] 99 % (04/29 0500) Weight:  [194 lb 7.1 oz (88.2 kg)] 194 lb 7.1 oz (88.2 kg) (04/29 0500) Last BM Date: 09/10/17  Weight change: Filed Weights   09/10/17 0555 09/11/17 0500 09/12/17 0500  Weight: 200 lb 6.4 oz (90.9 kg) 195 lb 8.8 oz (88.7 kg) 194 lb 7.1 oz (88.2 kg)    Intake/Output:   Intake/Output Summary (Last 24 hours) at 09/12/2017 0750 Last data filed at 09/12/2017 0704 Gross per 24 hour  Intake 2231.45 ml  Output 3985 ml  Net -1753.55 ml      Physical Exam   Physical Exam: CVP  12.  GENERAL: Sitting in the chair.  HEENT: normal  anicteric NECK: Supple, JVP 10-11.  2+ bilaterally, no bruits.  No lymphadenopathy or thyromegaly appreciated.   CARDIAC: Incision ok   Mechanical heart sounds with LVAD hum present.  LUNGS:  Clear to auscultation bilaterally. Dull at bases ABDOMEN:  Soft, round, nontender, positive bowel sounds x4.     LVAD exit site: well-healed and incorporated.  Dressing dry and intact.  No erythema or drainage.  Stabilization device present and accurately applied.  Driveline dressing is being changed daily per sterile technique. EXTREMITIES:  Warm and  dry, no cyanosis, clubbing, rash or 1+ edema . RUE PICC  Neuro: alert & oriented x 3, cranial nerves grossly intact. moves all 4 extremities w/o difficulty. Affect pleasant   Telemetry   NSR with frequent PVCs.   EKG    No new tracings.    Labs    CBC Recent Labs    09/11/17 0214 09/11/17 1735 09/12/17 0449  WBC 18.5*  --  18.1*  NEUTROABS 13.7*  --  PENDING  HGB 8.0* 10.2* 8.8*  HCT 24.3* 30.0* 27.3*  MCV 86.8  --  87.2  PLT 247  --  338   Basic Metabolic Panel Recent Labs    46/19/01 0423  09/11/17 0214 09/11/17 1735 09/12/17 0449  NA 130*   < > 130* 130* 131*  K 3.3*   < > 3.8 3.5 3.6  CL 92*   < > 90* 85* 87*  CO2 28  --  29  --  35*  GLUCOSE 114*   < > 132* 163* 167*  BUN 23*   < > 26* 29* 27*  CREATININE 1.21   < > 1.24 1.10 1.05  CALCIUM 8.6*  --  8.8*  --  9.0  MG 2.1  --  2.2  --  2.3  PHOS 3.6  --   --   --   --    < > =  values in this interval not displayed.   Liver Function Tests Recent Labs    09/12/17 0449  AST 29  ALT 18  ALKPHOS 78  BILITOT 1.2  PROT 7.5  ALBUMIN 2.5*   No results for input(s): LIPASE, AMYLASE in the last 72 hours. Cardiac Enzymes No results for input(s): CKTOTAL, CKMB, CKMBINDEX, TROPONINI in the last 72 hours.  BNP: BNP (last 3 results) Recent Labs    01/28/17 1412 08/25/17 1337 09/07/17 0411  BNP 2,506.0* 2,510.0* 272.7*    ProBNP (last 3 results) No results for input(s): PROBNP in the last 8760 hours.   D-Dimer No results for input(s): DDIMER in the last 72 hours. Hemoglobin A1C No results for input(s): HGBA1C in the last 72 hours. Fasting Lipid Panel No results for input(s): CHOL, HDL, LDLCALC, TRIG, CHOLHDL, LDLDIRECT in the last 72 hours. Thyroid Function Tests No results for input(s): TSH, T4TOTAL, T3FREE, THYROIDAB in the last 72 hours.  Invalid input(s): FREET3  Other results:   Imaging    No results found.   Medications:     Scheduled Medications: . amiodarone  200 mg Oral  BID  . aspirin EC  81 mg Oral Daily  . chlorhexidine  15 mL Mouth Rinse BID  . Chlorhexidine Gluconate Cloth  6 each Topical Daily  . citalopram  10 mg Oral Daily  . fentaNYL  75 mcg Transdermal Q72H  . furosemide  80 mg Intravenous BID  . gabapentin  100 mg Oral TID  . insulin aspart  0-15 Units Subcutaneous TID WC  . insulin aspart  0-5 Units Subcutaneous QHS  . insulin detemir  14 Units Subcutaneous BID  . isosorbide-hydrALAZINE  1 tablet Oral BID  . mouth rinse  15 mL Mouth Rinse q12n4p  . metolazone  5 mg Oral Once  . nutrition supplement (JUVEN)  1 packet Oral BID BM  . pantoprazole  40 mg Oral Daily  . sacubitril-valsartan  1 tablet Oral BID  . senna-docusate  2 tablet Oral QHS  . sodium chloride flush  10-40 mL Intracatheter Q12H  . sodium chloride flush  3 mL Intravenous Q12H  . traZODone  50 mg Oral QHS  . warfarin  5 mg Oral q1800  . Warfarin - Physician Dosing Inpatient   Does not apply q1800    Infusions: . sodium chloride    . epinephrine 1 mcg/min (09/12/17 0700)  . lactated ringers Stopped (09/06/17 1623)  . milrinone 0.4 mcg/kg/min (09/12/17 0700)  . piperacillin-tazobactam (ZOSYN)  IV 3.375 g (09/12/17 0654)  . potassium chloride      PRN Medications: ALPRAZolam, fentaNYL (SUBLIMAZE) injection, MUSCLE RUB, ondansetron (ZOFRAN) IV, oxyCODONE, sodium chloride, sodium chloride flush, sodium chloride flush, traMADol    Patient Profile   Ms Poupard is 47 year old with a history of NICM, HTN, ETOH abuse, smoker, asthma, and chronic systolic heart failure.   Assessment/Plan   1. Cardiogenic Shock- NICM had LHC 2018 with normal cors.  - ECHO this admit EF 5-10%. RV moderately reduced.   - s/p HM 3 LVAD 09/06/17   - CO-OX 54%. Remains on milrinone 0.4. Off epi this morning.   -With RV failure will add sildenafil 20 mg tid. Stop bildil with addition of sildenafil.  - Continue entresto 24-26 mg twice a day.  - CVP 11-12. Continue 80 mg IV lasix twice  Daily  + metolazone. Renal function stable.  -LDH 353 INR 2.13  - Smoker- Not a candidate for transplant. Needs 6 months off nicotine.  -  Blood Type B+  - Continue neurontin for pocket pain.   2. Post op anemia - 4/27 1u RBCs - Hgb 8.8   3. Shock Liver - Improved with hemodynamic support.   4. AKI  -Resolved.   5. Hypokalemia - 3.6 will supp  6.  Tobacco Abuse  - Encouraged complete cessation.  - Needs 6 months off for transplant consideration.   7. ETOH  - Encouraged complete cessation.   8. Gout - Uric Acid 14.5 - Treated with 3 days of steroids. Now on allopurinol - Stable.   9. CAP - Finished abx  10. Anxiety - Continue xanax and sertraline on 4/17.  - Improved on Celexa and Xanax.   Consult PT.   I reviewed the LVAD parameters from today, and compared the results to the patient's prior recorded data.  No programming changes were made.  The LVAD is functioning within specified parameters.  The patient performs LVAD self-test daily.  LVAD interrogation was negative for any significant power changes, alarms or PI events/speed drops.  LVAD equipment check completed and is in good working order.  Back-up equipment present.   LVAD education done on emergency procedures and precautions and reviewed exit site care.   Length of Stay: 18  Tonye Becket, NP  7:50 AM   Advanced Heart Failure Team Pager [phone] (M-F; 7a - 4p)  Please contact [facility] for night-coverage after hours (4p -7a ) and weekends on [email or web address]  Patient seen and examined with Tonye Becket, NP. We discussed all aspects of the encounter. I agree with the assessment and plan as stated above.   He is progressing but still very tenuous with RV failure post VAD. Epi stopped this am. Will need to watch co-ox closely. Continue milrinone at 0.4. Continue to diurese. Entresto added for HTN. Agree with sildenafil. No bidil with sildenafil due to nitrate interaction. Continue neurontin for CP. Limit narcotics. D/w  Dr. Donata Clay.   Arvilla Meres, MD  10:05 PM

## 2017-09-12 NOTE — Progress Notes (Signed)
Physical Therapy Treatment Patient Details Name: Jonathan Wood MRN: [id number] DOB: 08-04-1970 Today's Date: 09/12/2017    History of Present Illness The patient is a 47 year old African American male, who presented to the hospital with fatigue, edema, dizziness, and cardiogenic shock. 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 Extubated 4/24. PMHx: Asthma, CHF, GERD, and History of hiatal hernia.    PT Comments    Pt with improved ambulation tolerance this date however is sleepy/lethargic most likely due to pain meds. Pt c/o bilat flank pain. Pt con't to require max v/c's to convert LVAD from wall power to battery power and back again requiring max v/c's.  Acute PT to con't to follow.   Follow Up Recommendations  Home health PT;Supervision/Assistance - 24 hour     Equipment Recommendations  Rolling walker with 5" wheels;3in1 (PT)    Recommendations for Other Services       Precautions / Restrictions Precautions Precautions: Sternal Precaution Booklet Issued: No Precaution Comments: LVAD, educated on sternal precautions, pt with poor carry over due to lethargy Restrictions Weight Bearing Restrictions: Yes    Mobility  Bed Mobility               General bed mobility comments: pt up in chair upon PT arrival  Transfers Overall transfer level: Needs assistance Equipment used: Rolling walker (2 wheeled) Transfers: Sit to/from Stand Sit to Stand: Min guard;+2 safety/equipment         General transfer comment: pt able to get up with hands on bilat knees from low surface height without difficulty  Ambulation/Gait Ambulation/Gait assistance: Min guard;+2 safety/equipment Ambulation Distance (Feet): 500 Feet Assistive device: Rolling walker (2 wheeled) Gait Pattern/deviations: Step-through pattern;Decreased stride length Gait velocity: slow Gait velocity interpretation: <1.8 ft/sec, indicate of risk for recurrent falls General Gait Details: pt with mild SOB, SpOs2 >93% on  2LO2 via [place], v/c's to pick up feet/increase foot clerance   Stairs             Wheelchair Mobility    Modified Rankin (Stroke Patients Only)       Balance Overall balance assessment: No apparent balance deficits (not formally assessed)                                          Cognition Arousal/Alertness: Lethargic;Suspect due to medications Behavior During Therapy: Flat affect Overall Cognitive Status: Impaired/Different from baseline Area of Impairment: Problem solving                             Problem Solving: Slow processing;Difficulty sequencing;Requires verbal cues;Requires tactile cues General Comments: pt tried to connect power cord to battery without clips. max directional v/c's to complete transfer from wall power to battery power and back to wall power. Pt with no carry over of sternal precautions.      Exercises      General Comments General comments (skin integrity, edema, etc.): VSS throught      Pertinent Vitals/Pain Pain Assessment: 0-10 Pain Score: 6  Pain Location: bilat flanks Pain Descriptors / Indicators: Discomfort;Sore;Grimacing Pain Intervention(s): Premedicated before session;Monitored during session    Home Living                      Prior Function            PT Goals (current goals can now  be found in the care plan section) Acute Rehab PT Goals Patient Stated Goal: go home Progress towards PT goals: Progressing toward goals    Frequency    Min 4X/week      PT Plan Current plan remains appropriate    Co-evaluation PT/OT/SLP Co-Evaluation/Treatment: Yes Reason for Co-Treatment: Complexity of the patient's impairments (multi-system involvement) PT goals addressed during session: Mobility/safety with mobility        AM-PAC PT "6 Clicks" Daily Activity  Outcome Measure  Difficulty turning over in bed (including adjusting bedclothes, sheets and blankets)?: A Little Difficulty  moving from lying on back to sitting on the side of the bed? : A Little Difficulty sitting down on and standing up from a chair with arms (e.g., wheelchair, bedside commode, etc,.)?: A Little Help needed moving to and from a bed to chair (including a wheelchair)?: A Lot Help needed walking in hospital room?: A Lot Help needed climbing 3-5 steps with a railing? : A Lot 6 Click Score: 15    End of Session Equipment Utilized During Treatment: Gait belt;Oxygen Activity Tolerance: Patient tolerated treatment well Patient left: in chair;with nursing/sitter in room;with call bell/phone within reach Nurse Communication: Mobility status;Precautions PT Visit Diagnosis: Other abnormalities of gait and mobility (R26.89)     Time: 1610-9604 PT Time Calculation (min) (ACUTE ONLY): 42 min  Charges:  $Gait Training: 8-22 mins $Therapeutic Activity: 8-22 mins                    G Codes:       Lewis Shock, PT, DPT Pager #: [phone] Office #: [phone]    Shoua Ressler M Helmuth Recupero 09/12/2017, 12:42 PM

## 2017-09-12 NOTE — Progress Notes (Signed)
HeartMate 3 Rounding Note postop day #6  Subjective:    47 year old male withAA nonischemic cardiomyopathy, admitted with recurrent heart failure and cardiogenic shock with preoperative double inotropes and balloon pump.  Preoperative moderate to severe RV dysfunction, no PFO on preoperative echo bubble study   implantation of HeartMate 3 left ventricular assist device April 23.  Preoperative intra-aortic balloon pump removed. Extubated POD #1   Postop HeartMate 3 implantation with stable hemodynamics and good VAD flow parameters and  pulsatility index..  RV function postop supported with milrinone - epinephrine nowoff.  Maintaining sinus rhythm.    MAP now improved  on entresto, Bidil with bettter VAD flow  PA catheter removed on postop day 2 and patient mobilized to chair without difficulty.Now ambulating in hall several hundred feet  Patient has had significant issues with surgical pain, but now improving.   CVP measurements have been elevated  but now improved 12 mm Hg and the patient is receiving Lasix  q 8 hrs transitioned from drip. Wt still up inhaled nitric oxide now off  Expected postop blood loss anemia hemoglobin8 - patient has received 2 units  PRBCs postop  Full liquid diet with fluid limit has been started LVAD INTERROGATION:  HeartMate 3 LVAD:  Flow 4.9 liters/min, speed 5400 RPM, power 4.2, PI 2.4.  Controller intact.   Objective:    Vital Signs:   Temp:  [97.6 F (36.4 C)-99.4 F (37.4 C)] 98.6 F (37 C) (04/29 0330) Pulse Rate:  [101-112] 109 (04/29 0500) Resp:  [14-32] 14 (04/29 0700) BP: (82-128)/(59-104) 94/59 (04/29 0200) SpO2:  [87 %-100 %] 99 % (04/29 0500) Weight:  [194 lb 7.1 oz (88.2 kg)] 194 lb 7.1 oz (88.2 kg) (04/29 0500) Last BM Date: 09/10/17 Mean arterial Pressure 75--90 mmHg mean arterial pressure  Intake/Output:   Intake/Output Summary (Last 24 hours) at 09/12/2017 0754 Last data filed at 09/12/2017 0704 Gross per 24 hour  Intake 2231.45  ml  Output 3985 ml  Net -1753.55 ml     Physical Exam: General:  Well appearing. No resp difficulty.  Breathing comfortably extubated HEENT: normal Neck: supple. JVP . Carotids 2+ bilat; no bruits. No lymphadenopathy or thryomegaly appreciated. Cor: Mechanical heart sounds with LVAD hum present. Lungs: clear Abdomen: soft, nontender, nondistended. No hepatosplenomegaly. No bruits or masses. Good bowel sounds. Extremities: no cyanosis, clubbing, rash, edema Neuro: alert & orientedx3, cranial nerves grossly intact. moves all 4 extremities w/o difficulty. Affect pleasant  Telemetry: Sinus rhythm  Labs: Basic Metabolic Panel: Recent Labs  Lab 09/07/17 0411  09/08/17 0432  09/09/17 0357  09/10/17 0423 09/10/17 1710 09/11/17 0214 09/11/17 1735 09/12/17 0449  NA 138   < > 133*   < > 128*   < > 130* 132* 130* 130* 131*  K 5.1   < > 5.0   < > 4.1   < > 3.3* 4.0 3.8 3.5 3.6  CL 106   < > 101   < > 90*   < > 92* 92* 90* 85* 87*  CO2 27   < > 21*  --  23  --  28  --  29  --  35*  GLUCOSE 111*   < > 121*   < > 162*   < > 114* 115* 132* 163* 167*  BUN 14   < > 22*   < > 22*   < > 23* 24* 26* 29* 27*  CREATININE 1.11   < > 1.40*   < > 1.26*   < >  1.21 1.10 1.24 1.10 1.05  CALCIUM 8.6*   < > 8.9  --  8.7*  --  8.6*  --  8.8*  --  9.0  MG 2.6*   < > 2.3  --  1.9  --  2.1  --  2.2  --  2.3  PHOS 3.7  --  5.2*  --  4.4  --  3.6  --   --   --   --    < > = values in this interval not displayed.    Liver Function Tests: Recent Labs  Lab 09/06/17 0430 09/07/17 0411 09/08/17 0432 09/09/17 0357 09/12/17 0449  AST 24 160* 125* 62* 29  ALT 36 31 29 27 18   ALKPHOS 76 59 65 73 78  BILITOT 1.3* 3.9* 4.2* 1.7* 1.2  PROT 7.0 6.5 6.4* 6.7 7.5  ALBUMIN 3.0* 3.4* 3.0* 2.8* 2.5*   No results for input(s): LIPASE, AMYLASE in the last 168 hours. No results for input(s): AMMONIA in the last 168 hours.  CBC: Recent Labs  Lab 09/08/17 0432  09/09/17 0357  09/10/17 0423 09/10/17 1710  09/11/17 0214 09/11/17 1735 09/12/17 0449  WBC 25.3*  --  20.6*  --  17.5*  --  18.5*  --  18.1*  NEUTROABS 20.5*  --  16.3*  --  12.5*  --  13.7*  --  13.5*  HGB 8.3*   < > 7.3*   < > 7.1* 8.8* 8.0* 10.2* 8.8*  HCT 25.7*   < > 23.1*   < > 21.8* 26.0* 24.3* 30.0* 27.3*  MCV 89.5  --  88.8  --  87.9  --  86.8  --  87.2  PLT 146*  --  159  --  217  --  247  --  338   < > = values in this interval not displayed.    INR: Recent Labs  Lab 09/08/17 0432 09/09/17 0357 09/10/17 0423 09/11/17 0214 09/12/17 0449  INR 1.58 1.41 1.61 2.21 2.13    Other results:  EKG:   Imaging: Dg Chest Port 1 View  Result Date: 09/11/2017 CLINICAL DATA:  LVAD. EXAM: PORTABLE CHEST 1 VIEW COMPARISON:  September 10, 2017 FINDINGS: Stable LVAD location. Stable right IJ and PICC line. No pneumothorax. Cardiomegaly. No overt edema. Retrocardiac opacity on the left is stable, possibly atelectasis. IMPRESSION: 1. Support apparatus as above.  No acute interval change. Electronically Signed   By: Gerome Sam III M.D   On: 09/11/2017 09:09     Medications:     Scheduled Medications: . amiodarone  200 mg Oral BID  . aspirin EC  81 mg Oral Daily  . chlorhexidine  15 mL Mouth Rinse BID  . Chlorhexidine Gluconate Cloth  6 each Topical Daily  . citalopram  10 mg Oral Daily  . fentaNYL  75 mcg Transdermal Q72H  . furosemide  80 mg Intravenous BID  . gabapentin  100 mg Oral TID  . insulin aspart  0-15 Units Subcutaneous TID WC  . insulin aspart  0-5 Units Subcutaneous QHS  . insulin detemir  14 Units Subcutaneous BID  . isosorbide-hydrALAZINE  1 tablet Oral BID  . mouth rinse  15 mL Mouth Rinse q12n4p  . metolazone  2.5 mg Oral Once  . nutrition supplement (JUVEN)  1 packet Oral BID BM  . pantoprazole  40 mg Oral Daily  . sacubitril-valsartan  1 tablet Oral BID  . senna-docusate  2 tablet Oral QHS  . sildenafil  20 mg Oral TID  . sodium chloride flush  10-40 mL Intracatheter Q12H  . sodium chloride  flush  3 mL Intravenous Q12H  . traZODone  50 mg Oral QHS  . warfarin  5 mg Oral q1800  . Warfarin - Physician Dosing Inpatient   Does not apply q1800    Infusions: . sodium chloride    . lactated ringers Stopped (09/06/17 1623)  . milrinone 0.4 mcg/kg/min (09/12/17 0700)  . piperacillin-tazobactam (ZOSYN)  IV 3.375 g (09/12/17 0654)  . potassium chloride      PRN Medications: ALPRAZolam, fentaNYL (SUBLIMAZE) injection, MUSCLE RUB, ondansetron (ZOFRAN) IV, oxyCODONE, sodium chloride, sodium chloride flush, sodium chloride flush, traMADol   Assessment:  Nonischemic cardiomyopathy, cardiogenic shock Implantation of HeartMate 3 left ventricular assist device April 23 Stable LVAD flow parameters and hemodynamics on milrinone 0.4 Preoperative and postoperative RV dysfunction requiring inotropic support  Plan/Discussion:   Patient progressing, ambulated > 300 ft feet with physical therapy.  Maintaining sinus rhythm Postop RV dysfunction requiring IV milrinone - CVP    starting to decrease on iv lasix Needs ramp echo to probably increase speed Coumadin 5 mg this pm WBC  With persistent elevation stop POD #7     I reviewed the LVAD parameters from today, and compared the results to the patient's prior recorded data.  No programming changes were made.  The LVAD is functioning within specified parameters.  The patient performs LVAD self-test daily.  LVAD interrogation was negative for any significant power changes, alarms or PI events/speed drops.  LVAD equipment check completed and is in good working order.  Back-up equipment present.   LVAD education done on emergency procedures and precautions and reviewed exit site care.  Length of Stay: [address]  Kathlee Nations [place] III 09/12/2017, 7:54 AM

## 2017-09-12 NOTE — Progress Notes (Signed)
Palliative:  Jonathan Wood is lying in bed. He appears exhausted and says he is very tired. He has walked x 2 already today. Has had some issues with pain post-op that seems to be improving. Offered emotional support.   I will have to f/u and make sure his HCPOA/Living Will was notarized and completed. He is too tired today but I did not see any notes or a copy in his chart so I will follow up tomorrow.   No charge  [name], NP Palliative Medicine Team Pager # [phone] (M-F 8a-5p) Team Phone # [phone] (Nights/Weekends)

## 2017-09-13 ENCOUNTER — Inpatient Hospital Stay (HOSPITAL_COMMUNITY): Payer: Medicaid Other

## 2017-09-13 DIAGNOSIS — Z95811 Presence of heart assist device: Secondary | ICD-10-CM

## 2017-09-13 DIAGNOSIS — R570 Cardiogenic shock: Secondary | ICD-10-CM

## 2017-09-13 LAB — COMPREHENSIVE METABOLIC PANEL
ALT: 18 U/L (ref 17–63)
AST: 27 U/L (ref 15–41)
Albumin: 2.5 g/dL — ABNORMAL LOW (ref 3.5–5.0)
Alkaline Phosphatase: 70 U/L (ref 38–126)
Anion gap: 11 (ref 5–15)
BUN: 25 mg/dL — ABNORMAL HIGH (ref 6–20)
CO2: 36 mmol/L — ABNORMAL HIGH (ref 22–32)
Calcium: 9.3 mg/dL (ref 8.9–10.3)
Chloride: 81 mmol/L — ABNORMAL LOW (ref 101–111)
Creatinine, Ser: 0.95 mg/dL (ref 0.61–1.24)
GFR calc Af Amer: >60 mL/min (ref >60)
GFR calc non Af Amer: >60 mL/min (ref >60)
Glucose, Bld: 117 mg/dL — ABNORMAL HIGH (ref 65–99)
Potassium: 3.6 mmol/L (ref 3.5–5.1)
Sodium: 128 mmol/L — ABNORMAL LOW (ref 135–145)
Total Bilirubin: 1.4 mg/dL — ABNORMAL HIGH (ref 0.3–1.2)
Total Protein: 7.2 g/dL (ref 6.5–8.1)

## 2017-09-13 LAB — COOXEMETRY PANEL
Carboxyhemoglobin: 1.8 % — ABNORMAL HIGH (ref 0.5–1.5)
Carboxyhemoglobin: 1.9 % — ABNORMAL HIGH (ref 0.5–1.5)
Methemoglobin: 1.4 % (ref 0.0–1.5)
Methemoglobin: 1.4 % (ref 0.0–1.5)
O2 Saturation: 55.1 %
O2 Saturation: 58.1 %
Total hemoglobin: 10.3 g/dL — ABNORMAL LOW (ref 12.0–16.0)
Total hemoglobin: 10.1 g/dL — ABNORMAL LOW (ref 12.0–16.0)

## 2017-09-13 LAB — GLUCOSE, CAPILLARY
GLUCOSE-CAPILLARY: 86 mg/dL (ref 65–99)
GLUCOSE-CAPILLARY: 112 mg/dL — AB (ref 65–99)
Glucose-Capillary: 95 mg/dL (ref 65–99)
Glucose-Capillary: 97 mg/dL (ref 65–99)

## 2017-09-13 LAB — CBC WITH DIFFERENTIAL/PLATELET
Basophils Absolute: 0 10*3/uL (ref 0.0–0.1)
Basophils Relative: 0 %
Eosinophils Absolute: 0.2 10*3/uL (ref 0.0–0.7)
Eosinophils Relative: 1 %
HCT: 28.7 % — ABNORMAL LOW (ref 39.0–52.0)
Hemoglobin: 9.3 g/dL — ABNORMAL LOW (ref 13.0–17.0)
Lymphocytes Relative: 8 %
Lymphs Abs: 1.8 10*3/uL (ref 0.7–4.0)
MCH: 28.3 pg (ref 26.0–34.0)
MCHC: 32.4 g/dL (ref 30.0–36.0)
MCV: 87.2 fL (ref 78.0–100.0)
Monocytes Absolute: 3.2 10*3/uL — ABNORMAL HIGH (ref 0.1–1.0)
Monocytes Relative: 14 %
Neutro Abs: 17.8 10*3/uL — ABNORMAL HIGH (ref 1.7–7.7)
Neutrophils Relative %: 77 %
Platelets: 361 10*3/uL (ref 150–400)
RBC: 3.29 MIL/uL — ABNORMAL LOW (ref 4.22–5.81)
RDW: 14.6 % (ref 11.5–15.5)
WBC: 23 10*3/uL — ABNORMAL HIGH (ref 4.0–10.5)

## 2017-09-13 LAB — MAGNESIUM: Magnesium: 2.1 mg/dL (ref 1.7–2.4)

## 2017-09-13 LAB — BRAIN NATRIURETIC PEPTIDE: B Natriuretic Peptide: 233.6 pg/mL — ABNORMAL HIGH (ref 0.0–100.0)

## 2017-09-13 LAB — PROTIME-INR
INR: 2.42
Prothrombin Time: 26.2 seconds — ABNORMAL HIGH (ref 11.4–15.2)

## 2017-09-13 LAB — LACTATE DEHYDROGENASE: LDH: 342 U/L — ABNORMAL HIGH (ref 98–192)

## 2017-09-13 MED ORDER — WARFARIN SODIUM 2 MG PO TABS
4.0000 mg | ORAL_TABLET | Freq: Once | ORAL | Status: AC
  Administered 2017-09-13: 4 mg via ORAL
  Filled 2017-09-13: qty 2

## 2017-09-13 MED ORDER — POTASSIUM CHLORIDE 10 MEQ/50ML IV SOLN
10.0000 meq | INTRAVENOUS | Status: AC
  Administered 2017-09-13 (×2): 10 meq via INTRAVENOUS
  Filled 2017-09-13 (×2): qty 50

## 2017-09-13 MED ORDER — SORBITOL 70 % PO SOLN
30.0000 mL | Freq: Once | ORAL | Status: AC
  Administered 2017-09-13: 30 mL via ORAL
  Filled 2017-09-13: qty 30

## 2017-09-13 MED ORDER — POTASSIUM CHLORIDE CRYS ER 20 MEQ PO TBCR
20.0000 meq | EXTENDED_RELEASE_TABLET | ORAL | Status: AC
  Administered 2017-09-13 (×3): 20 meq via ORAL
  Filled 2017-09-13 (×3): qty 1

## 2017-09-13 MED ORDER — TORSEMIDE 20 MG PO TABS
40.0000 mg | ORAL_TABLET | Freq: Every day | ORAL | Status: DC
  Administered 2017-09-13 – 2017-09-14 (×2): 40 mg via ORAL
  Filled 2017-09-13 (×3): qty 2

## 2017-09-13 MED ORDER — FUROSEMIDE 10 MG/ML IJ SOLN: 80.0000 mg | Freq: Every day | INTRAMUSCULAR | Status: DC

## 2017-09-13 NOTE — Progress Notes (Signed)
HeartMate 3 Rounding Note postop day #7  Subjective:    47 year old male withAA nonischemic cardiomyopathy, admitted with recurrent heart failure and cardiogenic shock with preoperative double inotropes and balloon pump.  Preoperative moderate to severe RV dysfunction, no PFO on preoperative echo bubble study   implantation of HeartMate 3 left ventricular assist device April 23.  Preoperative intra-aortic balloon pump removed. Extubated POD #1   Postop HeartMate 3 implantation with stable hemodynamics and good VAD flow parameters and  pulsatility index..  RV function postop supported with milrinone - .  Maintaining sinus rhythm on po amiodarone   MAP now improved  on entresto, revatio with bettter VAD flow 5 L   PA catheter removed on postop day 2 and patient mobilized to chair without difficulty.Now ambulating in hall several hundred feet. Ready to transfer to 2 C subacute  Patient has had significant issues with surgical pain, but now improving. Fentanyl stopped  CVP measurements have been elevated  but now improved< 12 mm Hg and the patient is at dry wt - demedex po 40 now  i  Expected postop blood loss anemia hemoglobin8 - patient has received 2 units  PRBCs postop WBC elevated w/o fever- re culture urine, blood. Surgical incisins clean, dry    LVAD INTERROGATION:  HeartMate 3 LVAD:  Flow 5.2 liters/min, speed 5400 RPM, power 4.2, PI 2.4.  Controller intact.   Objective:    Vital Signs:   Temp:  [97.4 F (36.3 C)-97.8 F (36.6 C)] 97.7 F (36.5 C) (04/30 0734) Pulse Rate:  [41-123] 108 (04/30 0500) Resp:  [14-33] 20 (04/30 0700) BP: (75-124)/(55-113) 100/85 (04/30 0700) SpO2:  [92 %-99 %] 97 % (04/30 0500) Weight:  [188 lb 7.9 oz (85.5 kg)] 188 lb 7.9 oz (85.5 kg) (04/30 0500) Last BM Date: 09/10/17 Mean arterial Pressure 75--90 mmHg mean arterial pressure  Intake/Output:   Intake/Output Summary (Last 24 hours) at 09/13/2017 0907 Last data filed at 09/13/2017  0500 Gross per 24 hour  Intake 316 ml  Output 2475 ml  Net -2159 ml     Physical Exam: General:  Well appearing. No resp difficulty.  Breathing comfortably extubated HEENT: normal Neck: supple. JVP . Carotids 2+ bilat; no bruits. No lymphadenopathy or thryomegaly appreciated. Cor: Mechanical heart sounds with LVAD hum present. Lungs: clear Abdomen: soft, nontender, nondistended. No hepatosplenomegaly. No bruits or masses. Good bowel sounds. Extremities: no cyanosis, clubbing, rash, edema Neuro: alert & orientedx3, cranial nerves grossly intact. moves all 4 extremities w/o difficulty. Affect pleasant  Telemetry: Sinus rhythm  Labs: Basic Metabolic Panel: Recent Labs  Lab 09/07/17 0411  09/08/17 0432  09/09/17 0357  09/10/17 0423  09/11/17 0214 09/11/17 1735 09/12/17 0449 09/12/17 1605 09/13/17 0348  NA 138   < > 133*   < > 128*   < > 130*   < > 130* 130* 131* 127* 128*  K 5.1   < > 5.0   < > 4.1   < > 3.3*   < > 3.8 3.5 3.6 3.9 3.6  CL 106   < > 101   < > 90*   < > 92*   < > 90* 85* 87* 84* 81*  CO2 27   < > 21*  --  23  --  28  --  29  --  35*  --  36*  GLUCOSE 111*   < > 121*   < > 162*   < > 114*   < > 132* 163* 167* 139*  117*  BUN 14   < > 22*   < > 22*   < > 23*   < > 26* 29* 27* 26* 25*  CREATININE 1.11   < > 1.40*   < > 1.26*   < > 1.21   < > 1.24 1.10 1.05 1.00 0.95  CALCIUM 8.6*   < > 8.9  --  8.7*  --  8.6*  --  8.8*  --  9.0  --  9.3  MG 2.6*   < > 2.3  --  1.9  --  2.1  --  2.2  --  2.3  --  2.1  PHOS 3.7  --  5.2*  --  4.4  --  3.6  --   --   --   --   --   --    < > = values in this interval not displayed.    Liver Function Tests: Recent Labs  Lab 09/07/17 0411 09/08/17 0432 09/09/17 0357 09/12/17 0449 09/13/17 0348  AST 160* 125* 62* 29 27  ALT 31 29 27 18 18   ALKPHOS 59 65 73 78 70  BILITOT 3.9* 4.2* 1.7* 1.2 1.4*  PROT 6.5 6.4* 6.7 7.5 7.2  ALBUMIN 3.4* 3.0* 2.8* 2.5* 2.5*   No results for input(s): LIPASE, AMYLASE in the last 168 hours. No  results for input(s): AMMONIA in the last 168 hours.  CBC: Recent Labs  Lab 09/09/17 0357  09/10/17 0423  09/11/17 0214 09/11/17 1735 09/12/17 0449 09/12/17 1605 09/13/17 0348  WBC 20.6*  --  17.5*  --  18.5*  --  18.1*  --  23.0*  NEUTROABS 16.3*  --  12.5*  --  13.7*  --  13.5*  --  17.8*  HGB 7.3*   < > 7.1*   < > 8.0* 10.2* 8.8* 10.5* 9.3*  HCT 23.1*   < > 21.8*   < > 24.3* 30.0* 27.3* 31.0* 28.7*  MCV 88.8  --  87.9  --  86.8  --  87.2  --  87.2  PLT 159  --  217  --  247  --  338  --  361   < > = values in this interval not displayed.    INR: Recent Labs  Lab 09/09/17 0357 09/10/17 0423 09/11/17 0214 09/12/17 0449 09/13/17 0348  INR 1.41 1.61 2.21 2.13 2.42    Other results:  EKG:   Imaging: Dg Chest Port 1 View  Result Date: 09/12/2017 CLINICAL DATA:  Left ventricular assist device. EXAM: PORTABLE CHEST 1 VIEW COMPARISON:  Radiograph of September 11, 2017. FINDINGS: Stable cardiomegaly. Left ventricular assist device is unchanged in position. Sternotomy wires are noted. No pneumothorax is noted. Right-sided PICC line is again noted with tip in right atrium. No pneumothorax is noted. Mild bibasilar atelectasis is noted with probable mild left pleural effusion. Bony thorax is unremarkable. Right internal jugular catheter is been removed. IMPRESSION: Stable position of left ventricular assist device. Mild bibasilar subsegmental atelectasis is noted with probable mild left pleural effusion. Right-sided PICC line is noted with distal tip in right atrium. Electronically Signed   By: Lupita Raider, M.D.   On: 09/12/2017 07:53   US Abdomen Limited Ruq  Result Date: 09/12/2017 CLINICAL DATA:  Two days of abdominal pain.  History of CHF. EXAM: ULTRASOUND ABDOMEN LIMITED RIGHT UPPER QUADRANT COMPARISON:  Abdominal ultrasound of August 22, 2017 FINDINGS: Gallbladder: The gallbladder wall is top normal in thickness at 3.8 mm. There  is sludge present but no discrete stones are  identified. A gallbladder polyp measuring just under 3 mm in diameter is observed. There is no positive sonographic Murphy's sign today. Common bile duct: Diameter: 2.4 mm Liver: No focal lesion identified. Within normal limits in parenchymal echogenicity. Portal vein is patent on color Doppler imaging with normal direction of blood flow towards the liver. A small right pleural effusion is noted. IMPRESSION: Gallbladder sludge, gallbladder polyp, and top-normal gallbladder wall thickness. No stones. No sonographic evidence of acute cholecystitis. Small right pleural effusion. Electronically Signed   By: David  [place] M.D.   On: 09/12/2017 09:56     Medications:     Scheduled Medications: . amiodarone  200 mg Oral BID  . aspirin EC  81 mg Oral Daily  . Chlorhexidine Gluconate Cloth  6 each Topical Daily  . citalopram  10 mg Oral Daily  . gabapentin  100 mg Oral TID  . insulin aspart  0-15 Units Subcutaneous TID WC  . insulin aspart  0-5 Units Subcutaneous QHS  . insulin detemir  14 Units Subcutaneous BID  . nutrition supplement (JUVEN)  1 packet Oral BID BM  . pantoprazole  40 mg Oral Daily  . potassium chloride  20 mEq Oral Q4H  . sacubitril-valsartan  1 tablet Oral BID  . senna-docusate  2 tablet Oral QHS  . sildenafil  20 mg Oral TID  . sodium chloride flush  10-40 mL Intracatheter Q12H  . sorbitol  30 mL Oral Once  . torsemide  40 mg Oral Daily  . traZODone  50 mg Oral QHS  . warfarin  4 mg Oral ONCE-1800  . Warfarin - Physician Dosing Inpatient   Does not apply q1800    Infusions: . sodium chloride    . lactated ringers Stopped (09/06/17 1623)  . milrinone 0.4 mcg/kg/min (09/13/17 0500)  . piperacillin-tazobactam (ZOSYN)  IV 3.375 g (09/13/17 0530)  . potassium chloride      PRN Medications: ALPRAZolam, MUSCLE RUB, ondansetron (ZOFRAN) IV, oxyCODONE, sodium chloride, sodium chloride flush, traMADol   Assessment:  Nonischemic cardiomyopathy, cardiogenic  shock Implantation of HeartMate 3 left ventricular assist device April 23 Stable LVAD flow parameters and hemodynamics on milrinone 0.4 Preoperative and postoperative RV dysfunction requiring inotropic support  Plan/Discussion:   Patient progressing, ambulated > 300 ft feet with physical therapy.  Maintaining sinus rhythm Postop RV dysfunction requiring IV milrinone - CVP    starting to decrease  Needs ramp echo to probably increase speed Coumadin 4 mg this pm WBC  With persistent elevation -  reculture  Transfer to stepdown for slow milrinone wean      I reviewed the LVAD parameters from today, and compared the results to the patient's prior recorded data.  No programming changes were made.  The LVAD is functioning within specified parameters.  The patient performs LVAD self-test daily.  LVAD interrogation was negative for any significant power changes, alarms or PI events/speed drops.  LVAD equipment check completed and is in good working order.  Back-up equipment present.   LVAD education done on emergency procedures and precautions and reviewed exit site care.  Length of Stay: [address]  Kathlee Nations Ava III 09/13/2017, 9:07 AM

## 2017-09-13 NOTE — Progress Notes (Signed)
CSW met with patient at bedside. Patient reports he is feeling "wonderful". Patient reports he walked 3 times around the unit today and is thrilled with his progress. Patient states "what is most important is that I get better and get home to take care of my children". Patient appears to be adjusting to life with an LVAD. CSW will continue to follow for supportive needs throughout implant hospitalization. [name], [place], [place]

## 2017-09-13 NOTE — Progress Notes (Signed)
Physical Therapy Treatment Patient Details Name: Jonathan Wood MRN: [id number] DOB: May 13, 1971 Today's Date: 09/13/2017    History of Present Illness The patient is a 47 year old African American male, who presented to the hospital with fatigue, edema, dizziness, and cardiogenic shock. 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 Extubated 4/24. PMHx: Asthma, CHF, GERD, and History of hiatal hernia.    PT Comments    Pt progressing well with ambulation tolerance but con't to have poor recall and sequencing of switching LVAD from wall power to battery power. Pt reports "Its so much stuff to remember" Pt con't to start disconnecting LVAD without have either wall power cords or batteries ready to connect too. Acute PT to con't to follow and progress independence.    Follow Up Recommendations  Home health PT;Supervision/Assistance - 24 hour     Equipment Recommendations  Rolling walker with 5" wheels;3in1 (PT)    Recommendations for Other Services       Precautions / Restrictions Precautions Precautions: Sternal Precaution Booklet Issued: No Precaution Comments: LVAD, educated on sternal precautions, pt with poor carry over due to lethargy Restrictions Weight Bearing Restrictions: Yes Other Position/Activity Restrictions: sternal precautions    Mobility  Bed Mobility Overal bed mobility: Needs Assistance Bed Mobility: Sit to Supine Rolling: Min guard       Sit to sidelying: Min guard General bed mobility comments: v/c's for technique to adhere to sternal precautions  Transfers Overall transfer level: Needs assistance Equipment used: Rolling walker (2 wheeled) Transfers: Sit to/from Stand Sit to Stand: Min guard         General transfer comment: pt able to get up with hands on bilat knees from low surface height without difficulty  Ambulation/Gait Ambulation/Gait assistance: Min guard Ambulation Distance (Feet): 500 Feet Assistive device: Rolling walker (2 wheeled) Gait  Pattern/deviations: Step-through pattern;Decreased stride length Gait velocity: slow Gait velocity interpretation: <1.31 ft/sec, indicative of household ambulator General Gait Details: pt with 1 standing rest break. pt with more upright posture and fluidity of gait   Stairs             Wheelchair Mobility    Modified Rankin (Stroke Patients Only)       Balance Overall balance assessment: Mild deficits observed, not formally tested         Standing balance support: Bilateral upper extremity supported;During functional activity Standing balance-Leahy Scale: Fair                              Cognition Arousal/Alertness: Lethargic;Suspect due to medications Behavior During Therapy: Flat affect Overall Cognitive Status: Impaired/Different from baseline Area of Impairment: Problem solving;Attention                   Current Attention Level: Selective         Problem Solving: Slow processing;Difficulty sequencing;Requires verbal cues;Requires tactile cues General Comments: pt con't to require mod v/c's for transfering from wall power to battery power, unable to recall sternal precautions      Exercises      General Comments General comments (skin integrity, edema, etc.): VSS throughout      Pertinent Vitals/Pain Pain Assessment: 0-10 Pain Score: 6  Pain Location: L flank Pain Descriptors / Indicators: Discomfort;Sore;Grimacing Pain Intervention(s): Monitored during session    Home Living                      Prior Function  PT Goals (current goals can now be found in the care plan section) Acute Rehab PT Goals Patient Stated Goal: go home Progress towards PT goals: Progressing toward goals    Frequency    Min 4X/week      PT Plan Current plan remains appropriate    Co-evaluation              AM-PAC PT "6 Clicks" Daily Activity  Outcome Measure  Difficulty turning over in bed (including  adjusting bedclothes, sheets and blankets)?: A Little Difficulty moving from lying on back to sitting on the side of the bed? : A Little Difficulty sitting down on and standing up from a chair with arms (e.g., wheelchair, bedside commode, etc,.)?: A Little Help needed moving to and from a bed to chair (including a wheelchair)?: A Lot Help needed walking in hospital room?: A Lot Help needed climbing 3-5 steps with a railing? : A Lot 6 Click Score: 15    End of Session Equipment Utilized During Treatment: Gait belt;Oxygen Activity Tolerance: Patient tolerated treatment well Patient left: in chair;with nursing/sitter in room;with call bell/phone within reach Nurse Communication: Mobility status;Precautions PT Visit Diagnosis: Other abnormalities of gait and mobility (R26.89)     Time: 1000-1030 PT Time Calculation (min) (ACUTE ONLY): 30 min  Charges:  $Gait Training: 8-22 mins $Therapeutic Activity: 8-22 mins                    G Codes:       Lewis Shock, PT, DPT Pager #: [phone] Office #: [phone]    Jonathan Wood M Jonathan Wood 09/13/2017, 2:39 PM

## 2017-09-13 NOTE — Progress Notes (Addendum)
Pt automatic cuff readings 75/59 with MAP 66, doppler pressure 78; possibly modified systolic. Notified on-call VAD coordinator, told to repage if continue to trend downwards. Will continue to closely monitor.  Herma Ard, RN

## 2017-09-13 NOTE — Progress Notes (Signed)
Advanced Heart Failure/VAD Rounding Note  PCP-Cardiologist: No primary care provider on file.   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   Extubated 09/07/17.  Epi stopped yesterday. Co-ox dropped to 46%. Now back to 55% on milrinone 0.4.  MAPs low at MN. MAPS 80-90s today. CVP 7  Weight down 6 pounds. Now at baseline weight. Remains on sildenafil and Entresto.  Walked the unit this am.   LVAD Interrogation HM 3: Speed: 5400 Flow: 5.3 Power: 4.0 PI: 2.8  Rare PI events.  Personally reviewed    Objective:   Weight Range: 85.5 kg (188 lb 7.9 oz) Body mass index is 24.2 kg/m.   Vital Signs:   Temp:  [97.4 F (36.3 C)-98.8 F (37.1 C)] 97.8 F (36.6 C) (04/30 0450) Pulse Rate:  [41-123] 108 (04/30 0500) Resp:  [14-33] 16 (04/30 0500) BP: (75-124)/(55-113) 98/72 (04/30 0500) SpO2:  [92 %-99 %] 97 % (04/30 0500) Weight:  [85.5 kg (188 lb 7.9 oz)] 85.5 kg (188 lb 7.9 oz) (04/30 0500) Last BM Date: 09/10/17  Weight change: Filed Weights   09/11/17 0500 09/12/17 0500 09/13/17 0500  Weight: 88.7 kg (195 lb 8.8 oz) 88.2 kg (194 lb 7.1 oz) 85.5 kg (188 lb 7.9 oz)    Intake/Output:   Intake/Output Summary (Last 24 hours) at 09/13/2017 0624 Last data filed at 09/13/2017 0500 Gross per 24 hour  Intake 688.6 ml  Output 2725 ml  Net -2036.4 ml      Physical Exam   Physical Exam: General:  NAD.  HEENT: normal  Neck: supple. JVP 7.  Carotids 2+ bilat; no bruits. No lymphadenopathy or thryomegaly appreciated. Cor: surgical site ok. LVAD hum.  Lungs: Clear. Abdomen: obese soft, nontender, non-distended. No hepatosplenomegaly. No bruits or masses. Good bowel sounds. Driveline site clean. Anchor in place.  Extremities: no cyanosis, clubbing, rash. Warm trace edema  Neuro: alert & oriented x 3. No focal deficits. Moves all 4 without problem    Telemetry   Sinus tach 100-110 with PVCs. Personally reviewed   EKG    No new tracings.    Labs    CBC Recent Labs   09/12/17 0449 09/12/17 1605 09/13/17 0348  WBC 18.1*  --  23.0*  NEUTROABS 13.5*  --  17.8*  HGB 8.8* 10.5* 9.3*  HCT 27.3* 31.0* 28.7*  MCV 87.2  --  87.2  PLT 338  --  361   Basic Metabolic Panel Recent Labs    91/47/82 0449 09/12/17 1605 09/13/17 0348  NA 131* 127* 128*  K 3.6 3.9 3.6  CL 87* 84* 81*  CO2 35*  --  36*  GLUCOSE 167* 139* 117*  BUN 27* 26* 25*  CREATININE 1.05 1.00 0.95  CALCIUM 9.0  --  9.3  MG 2.3  --  2.1   Liver Function Tests Recent Labs    09/12/17 0449 09/13/17 0348  AST 29 27  ALT 18 18  ALKPHOS 78 70  BILITOT 1.2 1.4*  PROT 7.5 7.2  ALBUMIN 2.5* 2.5*   No results for input(s): LIPASE, AMYLASE in the last 72 hours. Cardiac Enzymes No results for input(s): CKTOTAL, CKMB, CKMBINDEX, TROPONINI in the last 72 hours.  BNP: BNP (last 3 results) Recent Labs    08/25/17 1337 09/07/17 0411 09/13/17 0348  BNP 2,510.0* 272.7* 233.6*    ProBNP (last 3 results) No results for input(s): PROBNP in the last 8760 hours.   D-Dimer No results for input(s): DDIMER in the last 72  hours. Hemoglobin A1C No results for input(s): HGBA1C in the last 72 hours. Fasting Lipid Panel No results for input(s): CHOL, HDL, LDLCALC, TRIG, CHOLHDL, LDLDIRECT in the last 72 hours. Thyroid Function Tests No results for input(s): TSH, T4TOTAL, T3FREE, THYROIDAB in the last 72 hours.  Invalid input(s): FREET3  Other results:   Imaging    US Abdomen Limited Ruq  Result Date: 09/12/2017 CLINICAL DATA:  Two days of abdominal pain.  History of CHF. EXAM: ULTRASOUND ABDOMEN LIMITED RIGHT UPPER QUADRANT COMPARISON:  Abdominal ultrasound of August 22, 2017 FINDINGS: Gallbladder: The gallbladder wall is top normal in thickness at 3.8 mm. There is sludge present but no discrete stones are identified. A gallbladder polyp measuring just under 3 mm in diameter is observed. There is no positive sonographic Murphy's sign today. Common bile duct: Diameter: 2.4 mm Liver: No  focal lesion identified. Within normal limits in parenchymal echogenicity. Portal vein is patent on color Doppler imaging with normal direction of blood flow towards the liver. A small right pleural effusion is noted. IMPRESSION: Gallbladder sludge, gallbladder polyp, and top-normal gallbladder wall thickness. No stones. No sonographic evidence of acute cholecystitis. Small right pleural effusion. Electronically Signed   By: David  [place] M.D.   On: 09/12/2017 09:56     Medications:     Scheduled Medications: . amiodarone  200 mg Oral BID  . aspirin EC  81 mg Oral Daily  . Chlorhexidine Gluconate Cloth  6 each Topical Daily  . citalopram  10 mg Oral Daily  . fentaNYL  75 mcg Transdermal Q72H  . furosemide  80 mg Intravenous BID  . gabapentin  100 mg Oral TID  . insulin aspart  0-15 Units Subcutaneous TID WC  . insulin aspart  0-5 Units Subcutaneous QHS  . insulin detemir  14 Units Subcutaneous BID  . nutrition supplement (JUVEN)  1 packet Oral BID BM  . pantoprazole  40 mg Oral Daily  . potassium chloride  20 mEq Oral Q4H  . sacubitril-valsartan  1 tablet Oral BID  . senna-docusate  2 tablet Oral QHS  . sildenafil  20 mg Oral TID  . sodium chloride flush  10-40 mL Intracatheter Q12H  . traZODone  50 mg Oral QHS  . Warfarin - Physician Dosing Inpatient   Does not apply q1800    Infusions: . sodium chloride    . lactated ringers Stopped (09/06/17 1623)  . milrinone 0.4 mcg/kg/min (09/13/17 0500)  . piperacillin-tazobactam (ZOSYN)  IV 3.375 g (09/13/17 0530)    PRN Medications: ALPRAZolam, fentaNYL (SUBLIMAZE) injection, MUSCLE RUB, ondansetron (ZOFRAN) IV, oxyCODONE, sodium chloride, sodium chloride flush, traMADol    Patient Profile   Ms Gamino is 47 year old with a history of NICM, HTN, ETOH abuse, smoker, asthma, and chronic systolic heart failure.   Assessment/Plan   1. Cardiogenic Shock- NICM had LHC 2018 with normal cors.  - ECHO this admit EF 5-10%. RV  moderately reduced.   - s/p HM 3 LVAD 09/06/17   - CO-OX 55%. Remains on milrinone 0.4. Off epi this morning.   - On sildenafil 20 mg tid for RV failure.  - Continue entresto 24-26 mg twice a day.  MAPS improved - CVP 7. Add torsemide 40 daily Renal function stable.  - LDH 342 INR 2.42 - Driveline site ok - Smoker- Not a candidate for transplant. Needs 6 months off nicotine.  - Blood Type B+  - Continue neurontin for pocket pain.   2. Leukocytosis.  - WBC up  to 23k. Afebrile. No localizing findings.  - on zosyn. CXR 4/29 ok  - encourage IS - reculture as needed.   3. Post op anemia - 4/27 1u RBCs - Hgb 9.3   4. Shock Liver - Improved with hemodynamic support.   5. AKI  -Resolved.   6. Hypokalemia - 3.6 will supp  7.  Tobacco Abuse  - Encouraged complete cessation.  - Needs 6 months off for transplant consideration.   8. Gout - Uric Acid 14.5 - Treated with 3 days of steroids. Now on allopurinol - Stable.   9. Anxiety - Continue xanax and sertraline on 4/17.  - Improved on Celexa and Xanax.    I reviewed the LVAD parameters from today, and compared the results to the patient's prior recorded data.  No programming changes were made.  The LVAD is functioning within specified parameters.  The patient performs LVAD self-test daily.  LVAD interrogation was negative for any significant power changes, alarms or PI events/speed drops.  LVAD equipment check completed and is in good working order.  Back-up equipment present.   LVAD education done on emergency procedures and precautions and reviewed exit site care.   Length of Stay: 29  Arvilla Meres, MD  6:24 AM   Advanced Heart Failure Team Pager [phone] (M-F; 7a - 4p)  Please contact [facility] for night-coverage after hours (4p -7a ) and weekends on [email or web address]

## 2017-09-13 NOTE — Progress Notes (Signed)
LVAD Coordinator Rounding Note:  Admitted 08/25/17 due to CHF. Dr. [name] consulted for Advanced Heart Failure. VAD evaluation completed.   HM III LVAD implanted on 09/07/17 by 09/06/17 under Destination Therapy criteria due to current smoking history.  Pt sitting up in chair, dozing. No complaints this am. Pt states that he has already walked around the unit.   Was paged last night around midnight about a MAP of 65, informed nurse to trend BP over next 2 hours and page if still low. Per nurse BP next hour was 89.  Vital signs: Temp:  98.8 HR: 99 Auto cuff: 100/85 (79) Doppler: not done O2 Sat: 97% on 4 L/[place] Wt: 189>195>200>201>194>188 lbs    LVAD interrogation reveals:  Speed: 5400 Flow: 5.3 Power: 4.0 PI:  2.8 Alarms: none Events: 1 PI Hematocrit:  29 Fixed speed: 5400 Low speed limit: 5100  Drive Line:  Will observe [place] changing the dressing this afternoon. Foley anchor applied   Labs:  LDH trend: [phone]  INR trend: 1.38>1.58>1.41>1.05  Hgb trend: 8.5>8.2>8.3>7.3>8.8>9.3  Co-ox trend: 68>76>59>58>54   Anticoagulation Plan: -INR Goal: 2.0 - 2.5 -ASA Dose: 325 mg daily until therapeutic INR  Blood Products:  Intra op: - 1 unit plts - 2 FFP - 250 cell saver  Post-op: 1 unit PRBC-09/10/17  Device: N/A  NO: 4 ppm  Gtts: - Levo>off 4/26 - Milrinone .4 mcg/kg/min - Epi 1 mcg/min off 4/28 - Amio 30 mg/hr off 4/26 - Lasix 10 mg/hr off 4/28  Arrhythmias: - Sinus tach 09/07/17 in 130's; started amiodarone (no bolus) trying to prevent post-op Afib  VAD Education:  Delivered HM III Patient Handbook to patient and asked him to read.  - Caregiver to perform dressing change today with my supervision  Adverse Events on VAD: -none  Plan/Recommendations:  1. Daily dressing change per Nurse Alla Feeling or VAD Coordinator. Will meet [place] today around 230-300 pm to observe her changing the dressing.  2. Please call VAD pager if any VAD equipment or  drive line issues.  Carlton Adam RN, VAD Coordinator 24/7 VAD pager: [phone]

## 2017-09-13 NOTE — Progress Notes (Signed)
Existing VAD dressing removed and site care performed using sterile technique. Drive line exit site cleaned with Chlora prep applicators x 2, allowed to dry, and gauze dressing with silver strip re-applied. Single stitch intact. Exit site healing and unincorporated, the velour is fully implanted at exit site. No redness, tenderness, drainage, foul odor or rash noted.     Pts caregiver was scheduled for me to observe her doing the dressing today at 230p, however, she did not show up. We will attempt to observe her tomorrow.   Carlton Adam RN, BSN VAD Coordinator 24/7 Pager [phone]

## 2017-09-13 NOTE — Progress Notes (Signed)
Palliative:  Jonathan Wood is still sleepy today. He says he has been awake since 05:30 am and just returned from a walk now. I did find HCPOA/Living Will that I helped him complete and will ask a notary to assist with completion. Emotional support provided.   No charge  [name], NP Palliative Medicine Team Pager # [phone] (M-F 8a-5p) Team Phone # [phone] (Nights/Weekends)

## 2017-09-13 NOTE — Progress Notes (Signed)
Patient's Advance Directive completed, by Chaplain with assistance from Notary and Volunteers. A copy was placed in his chart, and patient was given three copies. Chaplain Janell Quiet [phone]

## 2017-09-14 ENCOUNTER — Inpatient Hospital Stay (HOSPITAL_COMMUNITY): Payer: Medicaid Other

## 2017-09-14 LAB — GLUCOSE, CAPILLARY
GLUCOSE-CAPILLARY: 127 mg/dL — AB (ref 65–99)
Glucose-Capillary: 101 mg/dL — ABNORMAL HIGH (ref 65–99)
Glucose-Capillary: 111 mg/dL — ABNORMAL HIGH (ref 65–99)
Glucose-Capillary: 110 mg/dL — ABNORMAL HIGH (ref 65–99)

## 2017-09-14 LAB — COMPREHENSIVE METABOLIC PANEL
ALT: 19 U/L (ref 17–63)
AST: 28 U/L (ref 15–41)
Albumin: 2.6 g/dL — ABNORMAL LOW (ref 3.5–5.0)
Alkaline Phosphatase: 77 U/L (ref 38–126)
Anion gap: 10 (ref 5–15)
BUN: 27 mg/dL — ABNORMAL HIGH (ref 6–20)
CO2: 33 mmol/L — ABNORMAL HIGH (ref 22–32)
Calcium: 9.1 mg/dL (ref 8.9–10.3)
Chloride: 86 mmol/L — ABNORMAL LOW (ref 101–111)
Creatinine, Ser: 0.98 mg/dL (ref 0.61–1.24)
GFR calc Af Amer: >60 mL/min (ref >60)
GFR calc non Af Amer: >60 mL/min (ref >60)
Glucose, Bld: 101 mg/dL — ABNORMAL HIGH (ref 65–99)
Potassium: 4.4 mmol/L (ref 3.5–5.1)
Sodium: 129 mmol/L — ABNORMAL LOW (ref 135–145)
Total Bilirubin: 1.3 mg/dL — ABNORMAL HIGH (ref 0.3–1.2)
Total Protein: 7.8 g/dL (ref 6.5–8.1)

## 2017-09-14 LAB — COOXEMETRY PANEL
Carboxyhemoglobin: 2.2 % — ABNORMAL HIGH (ref 0.5–1.5)
Carboxyhemoglobin: 2 % — ABNORMAL HIGH (ref 0.5–1.5)
Carboxyhemoglobin: 2.2 % — ABNORMAL HIGH (ref 0.5–1.5)
Methemoglobin: 0.9 % (ref 0.0–1.5)
Methemoglobin: 1.5 % (ref 0.0–1.5)
Methemoglobin: 0.9 % (ref 0.0–1.5)
O2 Saturation: 51.8 %
O2 Saturation: 65.3 %
O2 Saturation: 64 %
Total hemoglobin: 9.6 g/dL — ABNORMAL LOW (ref 12.0–16.0)
Total hemoglobin: 9.8 g/dL — ABNORMAL LOW (ref 12.0–16.0)
Total hemoglobin: 10.2 g/dL — ABNORMAL LOW (ref 12.0–16.0)

## 2017-09-14 LAB — CBC
HCT: 29.4 % — ABNORMAL LOW (ref 39.0–52.0)
Hemoglobin: 9.3 g/dL — ABNORMAL LOW (ref 13.0–17.0)
MCH: 27.9 pg (ref 26.0–34.0)
MCHC: 31.6 g/dL (ref 30.0–36.0)
MCV: 88.3 fL (ref 78.0–100.0)
PLATELETS: 430 10*3/uL — AB (ref 150–400)
RBC: 3.33 MIL/uL — ABNORMAL LOW (ref 4.22–5.81)
RDW: 15.3 % (ref 11.5–15.5)
WBC: 23.1 10*3/uL — ABNORMAL HIGH (ref 4.0–10.5)

## 2017-09-14 LAB — URINE CULTURE: Culture: NO GROWTH

## 2017-09-14 LAB — LACTATE DEHYDROGENASE: LDH: 317 U/L — ABNORMAL HIGH (ref 98–192)

## 2017-09-14 LAB — PROTIME-INR
INR: 2.68
Prothrombin Time: 28.3 seconds — ABNORMAL HIGH (ref 11.4–15.2)

## 2017-09-14 MED ORDER — SORBITOL 70 % PO SOLN
60.0000 mL | Freq: Once | ORAL | Status: AC
  Administered 2017-09-14: 60 mL via ORAL
  Filled 2017-09-14: qty 60

## 2017-09-14 MED ORDER — WARFARIN SODIUM 1 MG PO TABS: 1.0000 mg | ORAL_TABLET | Freq: Once | ORAL | Status: DC

## 2017-09-14 MED ORDER — WARFARIN - PHARMACIST DOSING INPATIENT
Freq: Every day | Status: DC
  Administered 2017-09-14: 17:00:00

## 2017-09-14 MED ORDER — WARFARIN SODIUM 1 MG PO TABS
1.0000 mg | ORAL_TABLET | Freq: Once | ORAL | Status: AC
  Administered 2017-09-14: 1 mg via ORAL
  Filled 2017-09-14: qty 1

## 2017-09-14 MED ORDER — POLYETHYLENE GLYCOL 3350 17 G PO PACK
17.0000 g | PACK | Freq: Every day | ORAL | Status: DC
  Administered 2017-09-14 – 2017-09-18 (×3): 17 g via ORAL
  Filled 2017-09-14 (×6): qty 1

## 2017-09-14 MED ORDER — MILRINONE LACTATE IN DEXTROSE 20-5 MG/100ML-% IV SOLN
0.1250 ug/kg/min | INTRAVENOUS | Status: DC
  Administered 2017-09-14: 0.3 ug/kg/min via INTRAVENOUS
  Administered 2017-09-15 – 2017-09-16 (×2): 0.25 ug/kg/min via INTRAVENOUS
  Administered 2017-09-16: 0.125 ug/kg/min via INTRAVENOUS
  Filled 2017-09-14 (×4): qty 100

## 2017-09-14 MED ORDER — ENSURE ENLIVE PO LIQD
237.0000 mL | Freq: Two times a day (BID) | ORAL | Status: DC
  Administered 2017-09-14 – 2017-09-15 (×3): 237 mL via ORAL

## 2017-09-14 NOTE — Progress Notes (Signed)
ANTICOAGULATION CONSULT NOTE - Initial Consult  Pharmacy Consult for warfarin Indication: LVAD  No Known Allergies  Patient Measurements: Height: 6\' 2"  (188 cm) Weight: 187 lb 2.7 oz (84.9 kg) IBW/kg (Calculated) : 82.2  Vital Signs: Temp: 97.5 F (36.4 C) (05/01 0730) Temp Source: Oral (05/01 0730) BP: 90/72 (05/01 0730) Pulse Rate: 107 (05/01 0730)  Labs: Recent Labs    09/12/17 0449 09/12/17 1605 09/13/17 0348 09/14/17 0330 09/14/17 0749  HGB 8.8* 10.5* 9.3*  --  9.3*  HCT 27.3* 31.0* 28.7*  --  29.4*  PLT 338  --  361  --  430*  LABPROT 23.6*  --  26.2* 28.3*  --   INR 2.13  --  2.42 2.68  --   CREATININE 1.05 1.00 0.95 0.98  --     Estimated Creatinine Clearance: 111.8 mL/min (by C-G formula based on SCr of 0.98 mg/dL).   Medical History: Past Medical History:  Diagnosis Date  . Asthma   . CHF (congestive heart failure) (HCC)    a. 09/2016: EF 20-25% with cath showing normal cors  . GERD (gastroesophageal reflux disease)   . History of hiatal hernia     Assessment: 81 yoM s/p HM3 on 4/23 on warfarin with dosing to transition to pharmacy tonight. INR supratherapeutic today at 2.68, CBC/LDH stable. Pt will likely require around 3mg  daily.  Goal of Therapy:  INR 2-2.5 Monitor platelets by anticoagulation protocol: Yes   Plan:  -Warfarin 1mg  PO x1 -Daily INR  Fredonia Highland, PharmD, BCPS PGY-2 Cardiology Pharmacy Resident Pager: [phone] 09/14/2017

## 2017-09-14 NOTE — Progress Notes (Signed)
CSW informed by [facility] that a representative from [employer] was calling in regards to patient- they are involved to help get housing for patient.  CSW met with patient to confirm that this agency is involved- patient confirms that his friend Steffanie Dunn has been trying to help him getting housing for time of DC since his son who was staying at current house created issues at house which led to them being evicted.  Patient gives permission for CSW to speak with [employer] rep.  CSW called Wess Botts [phone] at [facility]- she needs proof of income within past 90 days or letter for SSI stating pt application date and intent to grant benefits or Award Letter from [facility] stating pt has been approved.  Pt confirms he worked at [employer] within last month and mom plans to bring pay stubs tomorrow  Claiborne Billings also involved in pt case [phone]- per Janett Billow she is a case worker with a homelessness organization in [place] will continue to follow and assist as able- Heart Failure CSW updated on situation  Jorge Ny, [place] Social Worker [phone]

## 2017-09-14 NOTE — Progress Notes (Signed)
LVAD Coordinator Rounding Note:  Admitted 08/25/17 due to CHF. Dr. [name] consulted for Advanced Heart Failure. VAD evaluation completed.   HM III LVAD implanted on 09/07/17 by 09/06/17 under Destination Therapy criteria due to current smoking history.  Pt sitting up in chair, says he is walking 3 laps in unit with no issues. Chief complaint today is abdominal pain. Pt reports he has not had BM, but is "passing gas". He received miralax this am.  Vital signs: Temp:  98.2 HR:  106 Auto cuff: 90/72 (79) - correlating with doppler MAP Doppler:  82 O2 Sat: 100% on 4 L/[place] Wt: 189>195>200>201>194>188>187 lbs    LVAD interrogation reveals:  Speed: 5400 Flow: 5.2 Power: 3.9 PI:  2.7 Alarms: none Events: none  Hematocrit:  29 Fixed speed: 5400 Low speed limit: 5100  Drive Line:  Gauze dressing dry and intact; anchor intact and accurately applied. Will observe [name] changing the dressing this afternoon.    Labs:  LDH trend: 420>483>404>367>360>353>342>317  INR trend: 1.38>1.58>1.41>1.61>2.21>2.13>2.42>2.68  Hgb trend: 8.5>8.2>8.3>7.3>8.8>9.3>9.3  Co-ox trend: 68>76>59>58>54>65   Anticoagulation Plan: -INR Goal: 2.0 - 2.5 -ASA Dose: 81 mg; started 09/11/17  Blood Products:  Intra op: - 1 unit plts - 2 FFP - 250 cell saver  Post-op: 1 unit PRBC-09/10/17  Device: N/A  NO: off 09/10/17  Gtts: - Levo>off 4/26 - Milrinone .375 mcg/kg/min - Epi 1 mcg/min off 4/28 - Amio 30 mg/hr off 4/26 - Lasix 10 mg/hr off 4/28  Arrhythmias: - Sinus tach 09/07/17 in 130's; started amiodarone (no bolus) trying to prevent post-op Afib  VAD Education:  VAD discharge binder at bedside. Asked pt to complete reading. His caregiver will need to read/review as well. We will need to schedule discharge teaching with him and [place]. We can discuss when she comes in for dressing change today.  - Caregiver to perform dressing change today with my supervision  Adverse Events on  VAD: -none  Plan/Recommendations:  1. Daily dressing change per Nurse Alla Feeling or VAD Coordinator. Need to observe/check off caregiver [name]) for dressing care today. Nurse will call VAD Coordinator when [place] arrives.   2. Please call VAD pager if any VAD equipment or drive line issues.  Hessie Diener RN, VAD Coordinator 24/7 VAD pager: [phone]

## 2017-09-14 NOTE — Progress Notes (Signed)
CSW met with patient at bedside. Patient states he is having trouble with a bowel movement but other than that he is "doing great". Patient is hopeful to move to step down and another step closer to going home with his family. Patient appears to be motivated and has a positive attitude towards recovery. CSW will continue to follow for supportive needs. [name], [place], [place]

## 2017-09-14 NOTE — Progress Notes (Signed)
Primary caregiver Jonathan Wood) here to change dressing. She has observed one dressing change, walked through dressing change with her including donning sterile gloves, sterile technique, changing dressing, and secureing drive line. Mother and daughter observed and will be considered back up caregivers.   Jonathan Wood removed exxisting VAD dressing and site care performed using sterile technique. Drive line exit site cleaned with Chlora prep applicators x 2, allowed to dry, and gauze dressing with silver strip re-applied. Exit site un ncorporated, two sutures present, the velour is fully implanted at exit site. No redness, tenderness, drainage, foul odor, or rash noted. Drive line anchor re-applied.   Stressed importance of good handwashing before and after procedure, preparing/cleaning surface where dressing kit will be opened, consistent (1 - 2 only) caregivers changing dressing. Report any signs of infection including redness, increased drainage, tenderness, foul odor, patient fever or chills.   All verbalized understanding of above. Jonathan Wood successfully changed dressing. Encouraged her to do as often as possible while pt in hospital under nursing observation in order to develop more confidence and have any questions answered. She agreed to same.

## 2017-09-14 NOTE — Progress Notes (Signed)
OT Cancellation Note  Patient Details Name: CLAUD ISHEE MRN: [id number] DOB: 1970/07/05   Cancelled Treatment:    Reason Eval/Treat Not Completed: Patient at procedure or test/ unavailable(transferring rooms). OT attempted to see earlier today, when Pt was transferring units. OT will attempt tomorrow.   Evern Bio Savreen Gebhardt 09/14/2017, 5:46 PM  Sherryl Manges OTR/L [phone]

## 2017-09-14 NOTE — Progress Notes (Signed)
Nutrition Follow-up  DOCUMENTATION CODES:   Not applicable  INTERVENTION:   D/C Juven  Ensure Enlive po BID, each supplement provides 350 kcal and 20 grams of protein   NUTRITION DIAGNOSIS:   Increased nutrient needs related to chronic illness as evidenced by estimated needs. Ongoing.   GOAL:   Patient will meet greater than or equal to 90% of their needs Progressing.   MONITOR:   PO intake, Supplement acceptance   ASSESSMENT:   Patient with PMH significant for polysubstance abuse, CHF with ejection fraction of 15-20%, s/p right/left heart cath, and cardiomegaly. Presented at [facility] with complaints of significant abdominal discomfort and shortness of breath. Pt found to have  acute on chronic CHF and volume overload despite chronically taking 240 mg lasix BID. Transferred to [facility] for cardiac care.   4/23 LVAD placed  PO intake has trended down to 25-75% of meals Spoke with RN who reports this may have been due to constipation as pt had not had a bm. He had miralax this am and has now had a bm. They have ordered his next few meals. Prior to LVAD pt was eating a lot of take out provided by family members. He is now only eating food from hospital and reports a decreased appetite last few days.  Pt has had transfer orders since yesterday and may be nearing d/c.    Will d/c juven and order ensure enlive as pt would benefit from additional kcal/protein and is on a fluid restriction.   Medications reviewed and include: senokot-s, miralax Labs reviewed: Na 129 (L) UOP: 1325 ml Pt is negative 8.7 L since admission  Diet Order:   Diet Order           Diet heart healthy/carb modified Room service appropriate? Yes; Fluid consistency: Thin; Fluid restriction: 1200 mL Fluid  Diet effective now          EDUCATION NEEDS:   Education needs have been addressed  Skin:  Skin Assessment: Skin Integrity Issues:  Last BM:  5/1 large  Height:   Ht Readings from Last 1  Encounters:  09/13/17 6\' 2"  (1.88 m)    Weight:   Wt Readings from Last 1 Encounters:  09/14/17 187 lb 2.7 oz (84.9 kg)    Ideal Body Weight:  86.4 kg  BMI:  Body mass index is 24.03 kg/m.  Estimated Nutritional Needs:   Kcal:  2300-2500 kcal  Protein:  115-125 g  Fluid:  >2.3 L/day  Kendell Bane RD, LDN, CNSC [phone] Pager [phone] After Hours Pager

## 2017-09-14 NOTE — Progress Notes (Signed)
HeartMate 3 Rounding Note postop day #8  Subjective:    47 year old male withAA nonischemic cardiomyopathy, admitted with recurrent heart failure and cardiogenic shock with preoperative double inotropes and balloon pump.  Preoperative moderate to severe RV dysfunction, no PFO on preoperative echo bubble study   implantation of HeartMate 3 left ventricular assist device April 23.  Preoperative intra-aortic balloon pump removed. Extubated POD #1   Postop HeartMate 3 implantation with stable hemodynamics and good VAD flow parameters and  pulsatility index..  RV function postop supported with milrinone - .  Maintaining sinus rhythm on po amiodarone   MAP now improved  on entresto, revatio with bettter VAD flow 5 L   PA catheter removed on postop day 2 and patient mobilized to chair without difficulty.Now ambulating in hall several hundred feet. Ready to transfer to 2 C subacute- waiting for bed availability  Patient has had significant issues with surgical pain, but now improving. Fentanyl stopped  CVP measurements have been elevated  but now improved< 12 mm Hg and the patient is at dry wt - demedex po 40 now  i  Expected postop blood loss anemia hemoglobin8 - patient has received 2 units  PRBCs postop WBC elevated w/o fever- re culture urine, blood. Surgical incisins clean, dry. Finished 1 week Zosyn  Repeat co-ox 65%- wean milrinone per HF service  LVAD INTERROGATION:  HeartMate 3 LVAD:  Flow 5.2 liters/min, speed 5400 RPM, power 4.2, PI 2.4.  Controller intact.   Objective:    Vital Signs:   Temp:  [97.5 F (36.4 C)-98.4 F (36.9 C)] 97.5 F (36.4 C) (05/01 0730) Pulse Rate:  [44-124] 107 (05/01 0730) Resp:  [12-33] 23 (05/01 0730) BP: (78-101)/(53-82) 90/72 (05/01 0730) SpO2:  [91 %-100 %] 99 % (05/01 0730) Weight:  [187 lb 2.7 oz (84.9 kg)] 187 lb 2.7 oz (84.9 kg) (05/01 0648) Last BM Date: 09/10/17 Mean arterial Pressure 75--90 mmHg mean arterial  pressure  Intake/Output:   Intake/Output Summary (Last 24 hours) at 09/14/2017 0911 Last data filed at 09/14/2017 0730 Gross per 24 hour  Intake 1362.23 ml  Output 1325 ml  Net 37.23 ml     Physical Exam: General:  Well appearing. No resp difficulty.  Breathing comfortably extubated HEENT: normal Neck: supple. JVP . Carotids 2+ bilat; no bruits. No lymphadenopathy or thryomegaly appreciated. Cor: Mechanical heart sounds with LVAD hum present. Lungs: clear Abdomen: soft, nontender, nondistended. No hepatosplenomegaly. No bruits or masses. Good bowel sounds. Extremities: no cyanosis, clubbing, rash, edema Neuro: alert & orientedx3, cranial nerves grossly intact. moves all 4 extremities w/o difficulty. Affect pleasant  Telemetry: Sinus rhythm  Labs: Basic Metabolic Panel: Recent Labs  Lab 09/08/17 0432  09/09/17 0357  09/10/17 0423  09/11/17 0214 09/11/17 1735 09/12/17 0449 09/12/17 1605 09/13/17 0348 09/14/17 0330  NA 133*   < > 128*   < > 130*   < > 130* 130* 131* 127* 128* 129*  K 5.0   < > 4.1   < > 3.3*   < > 3.8 3.5 3.6 3.9 3.6 4.4  CL 101   < > 90*   < > 92*   < > 90* 85* 87* 84* 81* 86*  CO2 21*  --  23  --  28  --  29  --  35*  --  36* 33*  GLUCOSE 121*   < > 162*   < > 114*   < > 132* 163* 167* 139* 117* 101*  BUN 22*   < >  22*   < > 23*   < > 26* 29* 27* 26* 25* 27*  CREATININE 1.40*   < > 1.26*   < > 1.21   < > 1.24 1.10 1.05 1.00 0.95 0.98  CALCIUM 8.9  --  8.7*  --  8.6*  --  8.8*  --  9.0  --  9.3 9.1  MG 2.3  --  1.9  --  2.1  --  2.2  --  2.3  --  2.1  --   PHOS 5.2*  --  4.4  --  3.6  --   --   --   --   --   --   --    < > = values in this interval not displayed.    Liver Function Tests: Recent Labs  Lab 09/08/17 0432 09/09/17 0357 09/12/17 0449 09/13/17 0348 09/14/17 0330  AST 125* 62* 29 27 28   ALT 29 27 18 18 19   ALKPHOS 65 73 78 70 77  BILITOT 4.2* 1.7* 1.2 1.4* 1.3*  PROT 6.4* 6.7 7.5 7.2 7.8  ALBUMIN 3.0* 2.8* 2.5* 2.5* 2.6*   No  results for input(s): LIPASE, AMYLASE in the last 168 hours. No results for input(s): AMMONIA in the last 168 hours.  CBC: Recent Labs  Lab 09/09/17 0357  09/10/17 0423  09/11/17 0214 09/11/17 1735 09/12/17 0449 09/12/17 1605 09/13/17 0348 09/14/17 0749  WBC 20.6*  --  17.5*  --  18.5*  --  18.1*  --  23.0* 23.1*  NEUTROABS 16.3*  --  12.5*  --  13.7*  --  13.5*  --  17.8*  --   HGB 7.3*   < > 7.1*   < > 8.0* 10.2* 8.8* 10.5* 9.3* 9.3*  HCT 23.1*   < > 21.8*   < > 24.3* 30.0* 27.3* 31.0* 28.7* 29.4*  MCV 88.8  --  87.9  --  86.8  --  87.2  --  87.2 88.3  PLT 159  --  217  --  247  --  338  --  361 430*   < > = values in this interval not displayed.    INR: Recent Labs  Lab 09/10/17 0423 09/11/17 0214 09/12/17 0449 09/13/17 0348 09/14/17 0330  INR 1.61 2.21 2.13 2.42 2.68    Other results:  EKG:   Imaging: Dg Chest Port 1 View  Result Date: 09/13/2017 CLINICAL DATA:  Chest soreness today EXAM: PORTABLE CHEST 1 VIEW COMPARISON:  Portable chest x-ray of 09/12/2016 FINDINGS: The lungs are not well aerated. Moderate cardiomegaly remains in there may be mild pulmonary vascular congestion. A small left effusion cannot be excluded. LVAD is noted. Median sternotomy sutures are present. Right central venous line tip overlies the expected SVC-RA junction. IMPRESSION: 1. Poor aeration with moderate cardiomegaly, pulmonary vascular congestion, possible small left effusion. 2. LVAD remains. Electronically Signed   By: Dwyane Dee M.D.   On: 09/13/2017 09:46   US Abdomen Limited Ruq  Result Date: 09/12/2017 CLINICAL DATA:  Two days of abdominal pain.  History of CHF. EXAM: ULTRASOUND ABDOMEN LIMITED RIGHT UPPER QUADRANT COMPARISON:  Abdominal ultrasound of August 22, 2017 FINDINGS: Gallbladder: The gallbladder wall is top normal in thickness at 3.8 mm. There is sludge present but no discrete stones are identified. A gallbladder polyp measuring just under 3 mm in diameter is observed.  There is no positive sonographic Murphy's sign today. Common bile duct: Diameter: 2.4 mm Liver: No focal lesion identified. Within  normal limits in parenchymal echogenicity. Portal vein is patent on color Doppler imaging with normal direction of blood flow towards the liver. A small right pleural effusion is noted. IMPRESSION: Gallbladder sludge, gallbladder polyp, and top-normal gallbladder wall thickness. No stones. No sonographic evidence of acute cholecystitis. Small right pleural effusion. Electronically Signed   By: David  [place] M.D.   On: 09/12/2017 09:56     Medications:     Scheduled Medications: . amiodarone  200 mg Oral BID  . aspirin EC  81 mg Oral Daily  . Chlorhexidine Gluconate Cloth  6 each Topical Daily  . citalopram  10 mg Oral Daily  . gabapentin  100 mg Oral TID  . insulin aspart  0-15 Units Subcutaneous TID WC  . insulin aspart  0-5 Units Subcutaneous QHS  . insulin detemir  14 Units Subcutaneous BID  . nutrition supplement (JUVEN)  1 packet Oral BID BM  . pantoprazole  40 mg Oral Daily  . polyethylene glycol  17 g Oral Daily  . sacubitril-valsartan  1 tablet Oral BID  . senna-docusate  2 tablet Oral QHS  . sildenafil  20 mg Oral TID  . sodium chloride flush  10-40 mL Intracatheter Q12H  . sorbitol  60 mL Oral Once  . torsemide  40 mg Oral Daily  . traZODone  50 mg Oral QHS  . warfarin  1 mg Oral ONCE-1800  . Warfarin - Physician Dosing Inpatient   Does not apply q1800    Infusions: . sodium chloride 5 mL/hr at 09/14/17 0730  . lactated ringers Stopped (09/06/17 1623)  . milrinone 0.375 mcg/kg/min (09/14/17 0730)    PRN Medications: ALPRAZolam, MUSCLE RUB, ondansetron (ZOFRAN) IV, oxyCODONE, sodium chloride, sodium chloride flush, traMADol   Assessment:  Nonischemic cardiomyopathy, cardiogenic shock Implantation of HeartMate 3 left ventricular assist device April 23 Stable LVAD flow parameters and hemodynamics on milrinone 0.375 Preoperative and  postoperative RV dysfunction requiring inotropic support  Plan/Discussion:   Patient progressing, ambulated > 300 ft feet with physical therapy.  Maintaining sinus rhythm Postop RV dysfunction requiring IV milrinone - CVP    starting to decrease  Needs ramp echo to probably increase speed Coumadin 1 mg this pm for INR 2.7 WBC  With persistent elevation -  reculture in process, prob stress reaction  Transfer to stepdown for slow milrinone wean Coumadin per pharmD Remove EPWs when INR < 2.5      I reviewed the LVAD parameters from today, and compared the results to the patient's prior recorded data.  No programming changes were made.  The LVAD is functioning within specified parameters.  The patient performs LVAD self-test daily.  LVAD interrogation was negative for any significant power changes, alarms or PI events/speed drops.  LVAD equipment check completed and is in good working order.  Back-up equipment present.   LVAD education done on emergency procedures and precautions and reviewed exit site care.  Length of Stay: 20  Kathlee Nations Trigt III 09/14/2017, 9:11 AM

## 2017-09-14 NOTE — Progress Notes (Addendum)
Advanced Heart Failure/VAD Rounding Note  PCP-Cardiologist: No primary care provider on file.   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   Extubated 09/07/17.  Yesterday milrinone cut back to 0.375 mcg. Todays CO-OX is 52%.   Complaining of constipation. Pain controlled with oxy ir. Able to walk around the unit. Denies orthopnea or PND. Feels a bit lightheaded. MAPs 70s   LVAD Interrogation HM 3: Speed: 5400 Flow: 5.2 Power: 4 PI: 2.8    Objective:   Weight Range: 187 lb 2.7 oz (84.9 kg) Body mass index is 24.03 kg/m.   Vital Signs:   Temp:  [97.7 F (36.5 C)-98.4 F (36.9 C)] 98.3 F (36.8 C) (05/01 0446) Pulse Rate:  [44-111] 109 (05/01 0700) Resp:  [12-33] 17 (05/01 0700) BP: (78-101)/(53-82) 94/76 (05/01 0300) SpO2:  [91 %-100 %] 98 % (05/01 0700) Weight:  [187 lb 2.7 oz (84.9 kg)] 187 lb 2.7 oz (84.9 kg) (05/01 0648) Last BM Date: 09/10/17  Weight change: Filed Weights   09/12/17 0500 09/13/17 0500 09/14/17 0648  Weight: 194 lb 7.1 oz (88.2 kg) 188 lb 7.9 oz (85.5 kg) 187 lb 2.7 oz (84.9 kg)    Intake/Output:   Intake/Output Summary (Last 24 hours) at 09/14/2017 0714 Last data filed at 09/14/2017 0700 Gross per 24 hour  Intake 1648.58 ml  Output 1325 ml  Net 323.58 ml      Physical Exam    Physical Exam: CVP 6 personally checked.  GENERAL: No acute distress. HEENT: normal  anicteric NECK: Supple, JVP 5-6   .  2+ bilaterally, no bruits.  No lymphadenopathy or thyromegaly appreciated.   CARDIAC:  Sternal wound ok Mechanical heart sounds with LVAD hum present.  LUNGS:  Clear to auscultation bilaterally.  No wheeze  ABDOMEN:  Soft, round, nontender, positive bowel sounds x4.     LVAD exit site: Dressing dry and intact.  No erythema or drainage.  Stabilization device present and accurately applied.  Driveline dressing is being changed daily per sterile technique. EXTREMITIES:  Warm and dry, no cyanosis, clubbing, rash or edema . RUE PICC Neuro: alert &  oriented x 3, cranial nerves grossly intact. moves all 4 extremities w/o difficulty. Affect pleasant      Telemetry   Sinus Tach 100s personally checked    EKG    No new tracings.    Labs    CBC Recent Labs    09/12/17 0449 09/12/17 1605 09/13/17 0348  WBC 18.1*  --  23.0*  NEUTROABS 13.5*  --  17.8*  HGB 8.8* 10.5* 9.3*  HCT 27.3* 31.0* 28.7*  MCV 87.2  --  87.2  PLT 338  --  361   Basic Metabolic Panel Recent Labs    01/77/93 0449  09/13/17 0348 09/14/17 0330  NA 131*   < > 128* 129*  K 3.6   < > 3.6 4.4  CL 87*   < > 81* 86*  CO2 35*  --  36* 33*  GLUCOSE 167*   < > 117* 101*  BUN 27*   < > 25* 27*  CREATININE 1.05   < > 0.95 0.98  CALCIUM 9.0  --  9.3 9.1  MG 2.3  --  2.1  --    < > = values in this interval not displayed.   Liver Function Tests Recent Labs    09/13/17 0348 09/14/17 0330  AST 27 28  ALT 18 19  ALKPHOS 70 77  BILITOT 1.4* 1.3*  PROT 7.2 7.8  ALBUMIN 2.5* 2.6*   No results for input(s): LIPASE, AMYLASE in the last 72 hours. Cardiac Enzymes No results for input(s): CKTOTAL, CKMB, CKMBINDEX, TROPONINI in the last 72 hours.  BNP: BNP (last 3 results) Recent Labs    08/25/17 1337 09/07/17 0411 09/13/17 0348  BNP 2,510.0* 272.7* 233.6*    ProBNP (last 3 results) No results for input(s): PROBNP in the last 8760 hours.   D-Dimer No results for input(s): DDIMER in the last 72 hours. Hemoglobin A1C No results for input(s): HGBA1C in the last 72 hours. Fasting Lipid Panel No results for input(s): CHOL, HDL, LDLCALC, TRIG, CHOLHDL, LDLDIRECT in the last 72 hours. Thyroid Function Tests No results for input(s): TSH, T4TOTAL, T3FREE, THYROIDAB in the last 72 hours.  Invalid input(s): FREET3  Other results:   Imaging    No results found.   Medications:     Scheduled Medications: . amiodarone  200 mg Oral BID  . aspirin EC  81 mg Oral Daily  . Chlorhexidine Gluconate Cloth  6 each Topical Daily  . citalopram   10 mg Oral Daily  . gabapentin  100 mg Oral TID  . insulin aspart  0-15 Units Subcutaneous TID WC  . insulin aspart  0-5 Units Subcutaneous QHS  . insulin detemir  14 Units Subcutaneous BID  . nutrition supplement (JUVEN)  1 packet Oral BID BM  . pantoprazole  40 mg Oral Daily  . sacubitril-valsartan  1 tablet Oral BID  . senna-docusate  2 tablet Oral QHS  . sildenafil  20 mg Oral TID  . sodium chloride flush  10-40 mL Intracatheter Q12H  . torsemide  40 mg Oral Daily  . traZODone  50 mg Oral QHS  . Warfarin - Physician Dosing Inpatient   Does not apply q1800    Infusions: . sodium chloride    . lactated ringers Stopped (09/06/17 1623)  . milrinone 0.375 mcg/kg/min (09/13/17 2030)    PRN Medications: ALPRAZolam, MUSCLE RUB, ondansetron (ZOFRAN) IV, oxyCODONE, sodium chloride, sodium chloride flush, traMADol    Patient Profile   Ms Lara is 47 year old with a history of NICM, HTN, ETOH abuse, smoker, asthma, and chronic systolic heart failure.   Assessment/Plan   1. Cardiogenic Shock- NICM had LHC 2018 with normal cors.  - ECHO this admit EF 5-10%. RV moderately reduced.   - s/p HM 3 LVAD 09/06/17   - Co-ox 52%. On milrinone 0.375 mcg.   - Volume status stable. Continue torsemide 40 mg daily.   - On sildenafil 20 mg tid for RV failure.  - Continue entresto 24-26 mg twice a day.  Maps stable.   Renal function stable.   - LDH 317 -INR 2.68  - Driveline site ok - Smoker- Not a candidate for transplant. Needs 6 months off nicotine.  - Blood Type B+  - Continue neurontin for pocket pain.   2. Leukocytosis.  - WBC up to 23k. Afebrile. No localizing findings.  - on zosyn. CXR 4/29 ok  - encourage IS. CBC in am.  - reculture as needed.   3. Post op anemia - 4/27 1u RBCs - Check CBC   4. Shock Liver - Improved with hemodynamic support.   5. AKI  -Resolved.   6. Hypokalemia - Stable.   7.  Tobacco Abuse  - Encouraged complete cessation.  - Needs 6 months  off for transplant consideration.   8. Gout - Uric Acid 14.5 - Completed 3 days of steroids.  Now on allopurinol - Stable.   9. Anxiety - Continue xanax and sertraline on 4/17.  - Improved on Celexa and Xanax.    Transfer to [facility] today.   I reviewed the LVAD parameters from today, and compared the results to the patient's prior recorded data.  No programming changes were made.  The LVAD is functioning within specified parameters.  The patient performs LVAD self-test daily.  LVAD interrogation was negative for any significant power changes, alarms or PI events/speed drops.  LVAD equipment check completed and is in good working order.  Back-up equipment present.   LVAD education done on emergency procedures and precautions and reviewed exit site care.   Length of Stay: 20  Tonye Becket, NP  7:14 AM   Advanced Heart Failure Team Pager [phone] (M-F; 7a - 4p)  Please contact [facility] for night-coverage after hours (4p -7a ) and weekends on [email or web address]  Patient seen and examined with the above-signed Advanced Practice Provider and/or Housestaff. I personally reviewed laboratory data, imaging studies and relevant notes. I independently examined the patient and formulated the important aspects of the plan. I have edited the note to reflect any of my changes or salient points. I have personally discussed the plan with the patient and/or family.  Continue to progress after VAD placement. Volume status looks good but co-ox still marge. Will wean milrinone slowly. Will plan Ramp echo tomorrow to optimize speed. WBC stable at 23k. No localizing signs infection. Watch closely. Continue zosyn. On Entresto for HTN - may need to cut back. VAD interrogated personally. Parameters stable.  Arvilla Meres, MD  6:42 PM

## 2017-09-15 ENCOUNTER — Inpatient Hospital Stay (HOSPITAL_COMMUNITY): Payer: Medicaid Other

## 2017-09-15 DIAGNOSIS — I5023 Acute on chronic systolic (congestive) heart failure: Secondary | ICD-10-CM

## 2017-09-15 DIAGNOSIS — Z95811 Presence of heart assist device: Secondary | ICD-10-CM

## 2017-09-15 DIAGNOSIS — R570 Cardiogenic shock: Secondary | ICD-10-CM

## 2017-09-15 LAB — COMPREHENSIVE METABOLIC PANEL
ALT: 26 U/L (ref 17–63)
AST: 41 U/L (ref 15–41)
Albumin: 2.6 g/dL — ABNORMAL LOW (ref 3.5–5.0)
Alkaline Phosphatase: 90 U/L (ref 38–126)
Anion gap: 9 (ref 5–15)
BUN: 26 mg/dL — ABNORMAL HIGH (ref 6–20)
CO2: 32 mmol/L (ref 22–32)
Calcium: 8.9 mg/dL (ref 8.9–10.3)
Chloride: 88 mmol/L — ABNORMAL LOW (ref 101–111)
Creatinine, Ser: 0.95 mg/dL (ref 0.61–1.24)
GFR calc Af Amer: >60 mL/min (ref >60)
GFR calc non Af Amer: >60 mL/min (ref >60)
Glucose, Bld: 74 mg/dL (ref 65–99)
Potassium: 4.1 mmol/L (ref 3.5–5.1)
Sodium: 129 mmol/L — ABNORMAL LOW (ref 135–145)
Total Bilirubin: 0.9 mg/dL (ref 0.3–1.2)
Total Protein: 7.7 g/dL (ref 6.5–8.1)

## 2017-09-15 LAB — GLUCOSE, CAPILLARY
Glucose-Capillary: 85 mg/dL (ref 65–99)
Glucose-Capillary: 102 mg/dL — ABNORMAL HIGH (ref 65–99)
Glucose-Capillary: 99 mg/dL (ref 65–99)
Glucose-Capillary: 140 mg/dL — ABNORMAL HIGH (ref 65–99)

## 2017-09-15 LAB — CBC
HCT: 29.1 % — ABNORMAL LOW (ref 39.0–52.0)
Hemoglobin: 9.1 g/dL — ABNORMAL LOW (ref 13.0–17.0)
MCH: 27.6 pg (ref 26.0–34.0)
MCHC: 31.3 g/dL (ref 30.0–36.0)
MCV: 88.2 fL (ref 78.0–100.0)
Platelets: 431 10*3/uL — ABNORMAL HIGH (ref 150–400)
RBC: 3.3 MIL/uL — ABNORMAL LOW (ref 4.22–5.81)
RDW: 15.5 % (ref 11.5–15.5)
WBC: 20 10*3/uL — ABNORMAL HIGH (ref 4.0–10.5)

## 2017-09-15 LAB — COOXEMETRY PANEL
Carboxyhemoglobin: 2.1 % — ABNORMAL HIGH (ref 0.5–1.5)
Carboxyhemoglobin: 2.1 % — ABNORMAL HIGH (ref 0.5–1.5)
Methemoglobin: 1.4 % (ref 0.0–1.5)
Methemoglobin: 0.8 % (ref 0.0–1.5)
O2 Saturation: 71.5 %
O2 Saturation: 55.2 %
Total hemoglobin: 9.5 g/dL — ABNORMAL LOW (ref 12.0–16.0)
Total hemoglobin: 10.8 g/dL — ABNORMAL LOW (ref 12.0–16.0)

## 2017-09-15 LAB — LACTATE DEHYDROGENASE: LDH: 290 U/L — ABNORMAL HIGH (ref 98–192)

## 2017-09-15 LAB — PROTIME-INR
INR: 2.81
Prothrombin Time: 29.4 seconds — ABNORMAL HIGH (ref 11.4–15.2)

## 2017-09-15 MED ORDER — TORSEMIDE 20 MG PO TABS
40.0000 mg | ORAL_TABLET | Freq: Every day | ORAL | Status: DC
  Administered 2017-09-15 – 2017-09-17 (×3): 40 mg via ORAL
  Filled 2017-09-15 (×3): qty 2

## 2017-09-15 MED ORDER — WARFARIN SODIUM 1 MG PO TABS
1.0000 mg | ORAL_TABLET | Freq: Once | ORAL | Status: AC
  Administered 2017-09-15: 1 mg via ORAL
  Filled 2017-09-15: qty 1

## 2017-09-15 NOTE — Progress Notes (Signed)
CVP 8

## 2017-09-15 NOTE — Progress Notes (Signed)
PT Cancellation Note  Patient Details Name: Jonathan Wood MRN: [id number] DOB: October 29, 1970   Cancelled Treatment:    Reason Eval/Treat Not Completed: Patient declined, no reason specified(pt reports 7/10 back pain having just received medication and declined mobility but states he will try later today. Will reattempt as time allows)   Fraya Ueda B Mckenna Boruff 09/15/2017, 7:56 AM  [name], PT [phone]

## 2017-09-15 NOTE — Progress Notes (Signed)
Occupational Therapy Treatment Patient Details Name: Jonathan Wood MRN: [id number] DOB: 05-Nov-1970 Today's Date: 09/15/2017    History of present illness The patient is a 47 year old African American male, who presented to the hospital with fatigue, edema, dizziness, and cardiogenic shock. 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 Extubated 4/24. PMHx: Asthma, CHF, GERD, and History of hiatal hernia.   OT comments  Pt agreeable to demonstrate power source change over and bed mobility maintaining sternal precautions this session. Pt reports being fatigued in general. Energy conservation education initiated (no handout provided). Affect of Pt seemed improved from previous PT session by friend/vistor from [place] - she was encouraging for Pt throughout session. OT will continue to follow in the acute setting prior to dc.    Follow Up Recommendations  No OT follow up;Supervision/Assistance - 24 hour(initially)    Equipment Recommendations  3 in 1 bedside commode    Recommendations for Other Services      Precautions / Restrictions Precautions Precautions: Sternal Precaution Booklet Issued: Yes (comment) Precaution Comments: verbally reviewed precautions with patient Restrictions Weight Bearing Restrictions: Yes(Sternal) Other Position/Activity Restrictions: sternal precautions       Mobility Bed Mobility Overal bed mobility: Needs Assistance Bed Mobility: Rolling;Sit to Sidelying Rolling: Min guard       Sit to sidelying: Min guard General bed mobility comments: vc for safe hand placement  Transfers Overall transfer level: Needs assistance Equipment used: Rolling walker (2 wheeled) Transfers: Sit to/from Stand Sit to Stand: Min guard         General transfer comment: vc for sequencing, rocking utilized for [employer] Overall balance assessment: Needs assistance Sitting-balance support: No upper extremity supported;Feet supported Sitting balance-Leahy Scale:  Good     Standing balance support: Bilateral upper extremity supported;During functional activity Standing balance-Leahy Scale: Fair                             ADL either performed or assessed with clinical judgement   ADL Overall ADL's : Needs assistance/impaired                 Upper Body Dressing : Moderate assistance;Sitting;Cueing for sequencing Upper Body Dressing Details (indicate cue type and reason): doff Ship broker Transfer: Min Hotel manager Details (indicate cue type and reason): simulated through recliner > bed transfer         Functional mobility during ADLs: Min guard;Rolling walker General ADL Comments: Pt is improving in ability to change power sources. Still requires min A for de-tangling and setting up lines but can perform change over at min guard level and was able to recall all elements needed.     Vision   Vision Assessment?: No apparent visual deficits   Perception     Praxis      Cognition Arousal/Alertness: Awake/alert Behavior During Therapy: Flat affect Overall Cognitive Status: Impaired/Different from baseline Area of Impairment: Memory                     Memory: Decreased recall of precautions         General Comments: Pt with friend visiting and seemed in better spirits        Exercises     Shoulder Instructions       General Comments Pt with improved ability to perform power source change over from previous session. Pt able to participate and perform at min guard level.  Pertinent Vitals/ Pain       Pain Assessment: No/denies pain Pain Intervention(s): Monitored during session  Home Living                                          Prior Functioning/Environment              Frequency  Min 3X/week        Progress Toward Goals  OT Goals(current goals can now be found in the care plan section)  Progress towards OT goals:  Progressing toward goals  Acute Rehab OT Goals Patient Stated Goal: get back to kids and "normal" life OT Goal Formulation: With patient Time For Goal Achievement: 09/23/17 Potential to Achieve Goals: Good  Plan Discharge plan remains appropriate    Co-evaluation                 AM-PAC PT "6 Clicks" Daily Activity     Outcome Measure   Help from another person eating meals?: None Help from another person taking care of personal grooming?: A Little Help from another person toileting, which includes using toliet, bedpan, or urinal?: A Lot Help from another person bathing (including washing, rinsing, drying)?: A Lot Help from another person to put on and taking off regular upper body clothing?: A Lot Help from another person to put on and taking off regular lower body clothing?: A Lot 6 Click Score: 15    End of Session Equipment Utilized During Treatment: Gait belt;Rolling walker;Oxygen(LVAD bag)  OT Visit Diagnosis: Pain;Unsteadiness on feet (R26.81);Other (comment) Pain - Right/Left: Left Pain - part of body: (flank)   Activity Tolerance Patient tolerated treatment well   Patient Left in bed;with call bell/phone within reach;with family/visitor present   Nurse Communication Mobility status        Time: 3235-5732 OT Time Calculation (min): 22 min  Charges: OT General Charges $OT Visit: 1 Visit OT Treatments $Self Care/Home Management : 8-22 mins  Sherryl Manges OTR/L [phone]   Evern Bio Tauheed Mcfayden 09/15/2017, 4:34 PM

## 2017-09-15 NOTE — Progress Notes (Signed)
Palliative:  Nayan is sleeping when I enter. He awakens and greets me. No questions/concerns for me. Emotional support provided. Will allow him to rest.   No charge  [name], NP Palliative Medicine Team Pager # [phone] (M-F 8a-5p) Team Phone # [phone] (Nights/Weekends)

## 2017-09-15 NOTE — Progress Notes (Signed)
Celexa wasted as it was opened before pt refused it. Witness: [name], RN   [place]  Araminta Zorn, RN

## 2017-09-15 NOTE — Progress Notes (Signed)
CVP 10 

## 2017-09-15 NOTE — Progress Notes (Signed)
ANTICOAGULATION CONSULT NOTE - Initial Consult  Pharmacy Consult for warfarin Indication: LVAD  No Known Allergies  Patient Measurements: Height: 6\' 2"  (188 cm) Weight: 177 lb 11.1 oz (80.6 kg) IBW/kg (Calculated) : 82.2  Vital Signs: Temp: 98.3 F (36.8 C) (05/02 0916) Temp Source: Oral (05/02 0916) BP: 88/65 (05/02 0917) Pulse Rate: 104 (05/02 0916)  Labs: Recent Labs    09/13/17 0348 09/14/17 0330 09/14/17 0749 09/15/17 0430  HGB 9.3*  --  9.3* 9.1*  HCT 28.7*  --  29.4* 29.1*  PLT 361  --  430* 431*  LABPROT 26.2* 28.3*  --  29.4*  INR 2.42 2.68  --  2.81  CREATININE 0.95 0.98  --  0.95    Estimated Creatinine Clearance: 113.1 mL/min (by C-G formula based on SCr of 0.95 mg/dL).   Medical History: Past Medical History:  Diagnosis Date  . Asthma   . CHF (congestive heart failure) (HCC)    a. 09/2016: EF 20-25% with cath showing normal cors  . GERD (gastroesophageal reflux disease)   . History of hiatal hernia     Assessment: 72 yoM s/p HM3 on 4/23 on warfarin with dosing to transition to pharmacy tonight. INR supratherapeutic today at 2.81, CBC/LDH stable.  Goal of Therapy:  INR 2-2.5 Monitor platelets by anticoagulation protocol: Yes   Plan:  -Warfarin 1mg  PO x1 again tonight -Daily INR  Fredonia Highland, PharmD, BCPS PGY-2 Cardiology Pharmacy Resident Pager: [phone] 09/15/2017

## 2017-09-15 NOTE — Progress Notes (Addendum)
Advanced Heart Failure/VAD Rounding Note  PCP-Cardiologist: No primary care provider on file.   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   Extubated 09/07/17.  Coox 71.5% this am on milrinone 0.375 mcg/kg/min.   Feeling good this am. Pain well controlled with Oxy IR. It does hurt quite bad when he coughs. He denies orthopnea or PND. No more lightheadedness walking halls yesterday. +BM. Feeling more steady. CVP 7 MAP 78 this am  LVAD Interrogation HM 3: Speed: 5400 Flow: 5.3 PI: 3.0 Power: 4.0. Occasional PI events  Objective:   Weight Range: 177 lb 11.1 oz (80.6 kg) Body mass index is 22.81 kg/m.   Vital Signs:   Temp:  [97.8 F (36.6 C)-98.8 F (37.1 C)] 98.8 F (37.1 C) (05/02 0327) Pulse Rate:  [98-112] 106 (05/02 0327) Resp:  [15-28] 15 (05/02 0440) BP: (76-91)/(46-70) 89/70 (05/02 0327) SpO2:  [92 %-100 %] 99 % (05/02 0440) Weight:  [177 lb 11.1 oz (80.6 kg)] 177 lb 11.1 oz (80.6 kg) (05/02 0602) Last BM Date: 09/14/17  Weight change: Filed Weights   09/13/17 0500 09/14/17 0648 09/15/17 0602  Weight: 188 lb 7.9 oz (85.5 kg) 187 lb 2.7 oz (84.9 kg) 177 lb 11.1 oz (80.6 kg)    Intake/Output:   Intake/Output Summary (Last 24 hours) at 09/15/2017 0736 Last data filed at 09/15/2017 0330 Gross per 24 hour  Intake 1118.33 ml  Output 195 ml  Net 923.33 ml      Physical Exam   Physical Exam:  GENERAL: Well appearing this am. NAD.  HEENT: Normal. anicterice NECK: Supple, JVP 7-8 cm. Carotids OK.  CARDIAC:  Incision ok Mechanical heart sounds with LVAD hum present.  LUNGS:  CTAB, normal effort.  ABDOMEN:  NT, ND, no HSM. No bruits or masses. +BS  LVAD exit site: Dressing dry and intact. No erythema or drainage. Stabilization device present and accurately applied. Driveline dressing changed daily per sterile technique. Extremities: no cyanosis, clubbing, rash, edema RUE PICC Neuro: alert & oriented x 3, cranial nerves grossly intact. moves all 4 extremities w/o  difficulty. Affect pleasant   Telemetry   NSR/Sinus Tach 90-100s, personally reviewed.   EKG    No new tracings.    Labs    CBC Recent Labs    09/13/17 0348 09/14/17 0749 09/15/17 0430  WBC 23.0* 23.1* 20.0*  NEUTROABS 17.8*  --   --   HGB 9.3* 9.3* 9.1*  HCT 28.7* 29.4* 29.1*  MCV 87.2 88.3 88.2  PLT 361 430* 431*   Basic Metabolic Panel Recent Labs    16/10/96 0348 09/14/17 0330 09/15/17 0430  NA 128* 129* 129*  K 3.6 4.4 4.1  CL 81* 86* 88*  CO2 36* 33* 32  GLUCOSE 117* 101* 74  BUN 25* 27* 26*  CREATININE 0.95 0.98 0.95  CALCIUM 9.3 9.1 8.9  MG 2.1  --   --    Liver Function Tests Recent Labs    09/14/17 0330 09/15/17 0430  AST 28 41  ALT 19 26  ALKPHOS 77 90  BILITOT 1.3* 0.9  PROT 7.8 7.7  ALBUMIN 2.6* 2.6*   No results for input(s): LIPASE, AMYLASE in the last 72 hours. Cardiac Enzymes No results for input(s): CKTOTAL, CKMB, CKMBINDEX, TROPONINI in the last 72 hours.  BNP: BNP (last 3 results) Recent Labs    08/25/17 1337 09/07/17 0411 09/13/17 0348  BNP 2,510.0* 272.7* 233.6*    ProBNP (last 3 results) No results for input(s): PROBNP in the  last 8760 hours.   D-Dimer No results for input(s): DDIMER in the last 72 hours. Hemoglobin A1C No results for input(s): HGBA1C in the last 72 hours. Fasting Lipid Panel No results for input(s): CHOL, HDL, LDLCALC, TRIG, CHOLHDL, LDLDIRECT in the last 72 hours. Thyroid Function Tests No results for input(s): TSH, T4TOTAL, T3FREE, THYROIDAB in the last 72 hours.  Invalid input(s): FREET3  Other results:   Imaging    No results found.   Medications:     Scheduled Medications: . amiodarone  200 mg Oral BID  . aspirin EC  81 mg Oral Daily  . Chlorhexidine Gluconate Cloth  6 each Topical Daily  . citalopram  10 mg Oral Daily  . feeding supplement (ENSURE ENLIVE)  237 mL Oral BID BM  . gabapentin  100 mg Oral TID  . insulin aspart  0-15 Units Subcutaneous TID WC  . insulin  aspart  0-5 Units Subcutaneous QHS  . insulin detemir  14 Units Subcutaneous BID  . pantoprazole  40 mg Oral Daily  . polyethylene glycol  17 g Oral Daily  . sacubitril-valsartan  1 tablet Oral BID  . senna-docusate  2 tablet Oral QHS  . sildenafil  20 mg Oral TID  . sodium chloride flush  10-40 mL Intracatheter Q12H  . traZODone  50 mg Oral QHS  . Warfarin - Pharmacist Dosing Inpatient   Does not apply q1800    Infusions: . sodium chloride 5 mL/hr at 09/15/17 0300  . lactated ringers Stopped (09/06/17 1623)  . milrinone 0.3 mcg/kg/min (09/15/17 0300)    PRN Medications: ALPRAZolam, MUSCLE RUB, ondansetron (ZOFRAN) IV, oxyCODONE, sodium chloride, sodium chloride flush, traMADol    Patient Profile   Ms Bowlby is 47 year old with a history of NICM, HTN, ETOH abuse, smoker, asthma, and chronic systolic heart failure.   Assessment/Plan   1. Cardiogenic Shock- NICM had LHC 2018 with normal cors.  - ECHO 08/26/2017 EF 5-10%. RV moderately reduced.   - s/p HM 3 LVAD 09/06/17   - Co-ox 71.5% this am on milrinone 0.30 mcg/kg/min. Will cut back to 0.25 mcg/kg/min and follow - Volume status stable on torsemide 40 mg daily.   - Continue sildenafil 20 mg tid for RV failure.  - Continue entresto 24-26 mg TID for now. MAPs in 70s/low 80s  - LDH 290 - INR 2.81 - Driveline site stable.  - Smoker- Not a candidate for transplant. Needs 6 months off nicotine.  - Blood Type B+  - Continue neurontin for pocket pain.   2. Leukocytosis.  - WBC 23.1 -> 20.0. Afebrile.  - on zosyn. CXR 4/29 stable - Encouraged IS.  - Reculture as needed.   3. Post op anemia - 4/27 1u RBCs - Hgb stable at 9.1 today.   4. Shock Liver - Resolved.   5. AKI  - Resolved.   6. Hypokalemia - Stable. K 4.1 this am.    7.  Tobacco Abuse  - Encouraged complete cessation on discharge.  - Needs 6 months off for transplant consideration.   8. Gout - Uric Acid 14.5 - Completed 3 days of steroids. Now on  allopurinol - Stable.   9. Anxiety - Continue xanax and sertraline on 4/17.  - Improved on Celexa and Xanax.  - No change to current plan.    Plan Ramp Echo today. Milrinone as above.   I reviewed the LVAD parameters from today, and compared the results to the patient's prior recorded data.  No programming  changes were made.  The LVAD is functioning within specified parameters.  The patient performs LVAD self-test daily.  LVAD interrogation was negative for any significant power changes, alarms or PI events/speed drops.  LVAD equipment check completed and is in good working order.  Back-up equipment present.   LVAD education done on emergency procedures and precautions and reviewed exit site care.   Length of Stay: 21  Graciella Freer, [place]  7:36 AM   Advanced Heart Failure Team Pager [phone] (M-F; 7a - 4p)  Please contact [facility] for night-coverage after hours (4p -7a ) and weekends on [email or web address]   Patient seen and examined with the above-signed Advanced Practice Provider and/or Housestaff. I personally reviewed laboratory data, imaging studies and relevant notes. I independently examined the patient and formulated the important aspects of the plan. I have edited the note to reflect any of my changes or salient points. I have personally discussed the plan with the patient and/or family.  Continues to improve. Remains on milrinone. Co-ox and CVP look ok. Will cut milrinone to 0.25. Restart low-dose torsemide. INR 2.8. Discussed dosing with PharmD personally. WBC 23k -> 20k. VAD interrogated personally. Parameters stable. Plan ramp echo tomorrow. Continue VAD teaching and ambulation.   Arvilla Meres, MD  10:41 PM

## 2017-09-15 NOTE — Progress Notes (Signed)
LVAD Coordinator Rounding Note:  Admitted 08/25/17 due to CHF. Dr. [name] consulted for Advanced Heart Failure. VAD evaluation completed.   HM III LVAD implanted on 09/07/17 by 09/06/17 under Destination Therapy criteria due to current smoking history.  Pt in bed. States he has not walked today. Pt states that he isnt sure where he will go after d/c as he has been evicted from his apartment. He asked me to call [name] as she has been working on finding him a place to live. Our social worker Annice Pih and I will take care of this situation.  Vital signs: Temp:  98.3 HR:  104 Auto cuff: 88/65(74) Doppler:  76 O2 Sat: 100% on 3 L/[place] Wt: 189>195>200>201>194>188>187>177 lbs    LVAD interrogation reveals:  Speed: 5400 Flow: 5.1 Power: 4.0 PI:  2.9 Alarms: none Events: none  Hematocrit:  29 Fixed speed: 5400 Low speed limit: 5100  Drive Line: Existing VAD dressing removed and site care performed using sterile technique. Drive line exit site cleaned with Chlora prep applicators x 2, allowed to dry, and gauze dressing with silver strip re-applied. Exit site healing and unincorporated, the velour is fully implanted at exit site. Single stitch intact. Scant amount of serousangineous drainage while cleaning. No redness, tenderness, foul odor or rash noted. Drive line anchor re-applied. Pt denies fever or chills.     Labs:  LDH trend: 420>483>404>367>360>353>342>317>290  INR trend: 1.38>1.58>1.41>1.61>2.21>2.13>2.42>2.68>2.8  Hgb trend: 8.5>8.2>8.3>7.3>8.8>9.3>9.3>9.1  Co-ox trend: 68>76>59>58>54>65>71.5   Anticoagulation Plan: -INR Goal: 2.0 - 2.5 -ASA Dose: 81 mg; started 09/11/17  Blood Products:  Intra op: - 1 unit plts - 2 FFP - 250 cell saver  Post-op: 1 unit PRBC-09/10/17  Device: N/A  NO: off 09/10/17  Gtts: - Levo>off 4/26 - Milrinone .25 mcg/kg/min - Epi 1 mcg/min off 4/28 - Amio 30 mg/hr off 4/26 - Lasix 10 mg/hr off 4/28  Arrhythmias: - Sinus tach 09/07/17  in 130's; started amiodarone (no bolus) trying to prevent post-op Afib  VAD Education:  VAD discharge binder at bedside. Asked pt to complete reading. His caregiver will need to read/review as well. Discharge teaching with him and [name] scheduled for tomorrow at 1430.   Adverse Events on VAD: -none  Plan/Recommendations:  1. Daily dressing change per Nurse Alla Feeling, VAD Coordinator or Kristy-caregiver.  2. RAMP echo tomorrow at 1400. 3. Discharge teaching scheduled for tomorrow at 1430. 4. Spoke with [name] and we are planning to discharge the patient to his mother's home.  5. Please call VAD pager if any VAD equipment or drive line issues.  Carlton Adam RN, VAD Coordinator 24/7 VAD pager: [phone]

## 2017-09-15 NOTE — Progress Notes (Addendum)
Physical Therapy Treatment Patient Details Name: Jonathan Wood MRN: [id number] DOB: 10/12/1970 Today's Date: 09/15/2017    History of Present Illness The patient is a 47 year old African American male, who presented to the hospital with fatigue, edema, dizziness, and cardiogenic shock. 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 Extubated 4/24. PMHx: Asthma, CHF, GERD, and History of hiatal hernia.    PT Comments    Pt pleasant and reports no pain with ability to increase ambulation distance and power transition. Pt denied attempting gait without rW and reports he is afraid of falling and will work toward gait without AD. Pt able to transition from wall to battery power with min cues for untangling lines, positioning batteries in holster and lines. Pt able to state items for back up bag except controller and shown and educated for all equipment and needs.  Listened to pt regarding being upset about not caring for kids and having 14yo be upset when visiting him. Pt educated for progression of mobility and return to functioning at home and community, pt encouraged with visitor present.   After documenting pt stated he wanted to sleep and requested return to wall power. mIn assist to return to wall power to manage removing holster first and not pulling lines.   HR 105-115 SpO2 94-98% on 2L with gait   Follow Up Recommendations  Home health PT;Supervision/Assistance - 24 hour     Equipment Recommendations  3in1 (PT);Other (comment)(rollator)    Recommendations for Other Services       Precautions / Restrictions Precautions Precautions: Sternal Precaution Booklet Issued: Yes (comment) Precaution Comments: LVAD, educated on sternal precautions with handout pt unable to recall no pushing and pulling    Mobility  Bed Mobility Overal bed mobility: Needs Assistance Bed Mobility: Supine to Sit     Supine to sit: Supervision     General bed mobility comments: HOb 15 degrees with cues not to  push to fully sit  Transfers Overall transfer level: Needs assistance   Transfers: Sit to/from Stand Sit to Stand: Min guard         General transfer comment: cues for scooting forward first and hands on knees  Ambulation/Gait Ambulation/Gait assistance: Min guard Ambulation Distance (Feet): 600 Feet Assistive device: Rolling walker (2 wheeled) Gait Pattern/deviations: Step-through pattern;Decreased stride length   Gait velocity interpretation: <1.8 ft/sec, indicate of risk for recurrent falls General Gait Details: pt with 2 standing rest breaks and cues for breathing technique   Stairs             Wheelchair Mobility    Modified Rankin (Stroke Patients Only)       Balance Overall balance assessment: Needs assistance   Sitting balance-Leahy Scale: Good       Standing balance-Leahy Scale: Fair                              Cognition Arousal/Alertness: Awake/alert Behavior During Therapy: Flat affect Overall Cognitive Status: Impaired/Different from baseline Area of Impairment: Memory                     Memory: Decreased recall of precautions         General Comments: pt with difficulty recalling precautions, pt emotional about not caring for children currently      Exercises      General Comments        Pertinent Vitals/Pain Pain Assessment: No/denies pain    Home Living  Prior Function            PT Goals (current goals can now be found in the care plan section) Progress towards PT goals: Progressing toward goals    Frequency           PT Plan Current plan remains appropriate    Co-evaluation              AM-PAC PT "6 Clicks" Daily Activity  Outcome Measure  Difficulty turning over in bed (including adjusting bedclothes, sheets and blankets)?: A Little Difficulty moving from lying on back to sitting on the side of the bed? : A Little Difficulty sitting down on and  standing up from a chair with arms (e.g., wheelchair, bedside commode, etc,.)?: A Little Help needed moving to and from a bed to chair (including a wheelchair)?: A Little Help needed walking in hospital room?: A Little Help needed climbing 3-5 steps with a railing? : A Little 6 Click Score: 18    End of Session Equipment Utilized During Treatment: Oxygen Activity Tolerance: Patient tolerated treatment well Patient left: in chair;with call bell/phone within reach;with family/visitor present Nurse Communication: Mobility status;Precautions PT Visit Diagnosis: Other abnormalities of gait and mobility (R26.89)     Time: 1610-9604 PT Time Calculation (min) (ACUTE ONLY): 38 min  Charges:  $Gait Training: 23-37 mins $Therapeutic Activity: 8-22 mins                    G Codes:       [name], PT [phone]    Kenyatta Keidel B Deborah Dondero 09/15/2017, 12:17 PM

## 2017-09-15 NOTE — Care Management Note (Addendum)
Case Management Note Previous CM note completed by [name] 08/30/2017, 9:53 AM   Patient Details  Name: TEJAS HARIRI MRN: [id number] Date of Birth: 03/28/1971  Subjective/Objective:   Pt admitted with SOB - HF                 Action/Plan:  PTA independent from home.  Pt confirmed he does not have insurance nor PCP.  Pt declined for CM to set up appt in a [facility] due to drive time associated with a PCP in [place].  Pt was only receptive to CM providing both [employer] and [employer]  information on AVS.  Neither [facility] nor [facility] had appts and pt is not within zip code parameters for [facility].  Per HF RN - pt will be monitored by HF SW and Pharm D for ongoing needs post discharge.  HF team to provide discharge medications.  Additional CM follow up notes:  09/06/17- 1600- [name] RN, CM- pt s/p HM3 VAD placement today- CM to continue to follow for transition of care needs  08/31/17- 1000- [name] RN, CM- pt tx to [facility] post cath- Admitted with recurrent heart failure, cardiogenic shock placed on IV inotrope-milrinone Plan for evaluation for implantable VAD- CM to follow for transition of care needs.    Expected Discharge Date:                  Expected Discharge Plan:  Home/Self Care  In-House Referral:     Discharge planning Services  CM Consult  Post Acute Care Choice:    Choice offered to:     DME Arranged:    DME Agency:     HH Arranged:    HH Agency:     Status of Service:     If discussed at [employer] of Stay Meetings, dates discussed:    Discharge Disposition:   Additional Comments: 09/16/2017 CM again discussed lack of PCP with HF RN and PA - team will refer pt out to accepting PCP in [place].   09/15/2017  Pt now on Progressive Care unit.  CSW and HF SW working with pt on discharge location and pt has recently been evicted.  [facility] following pt for potential needs at discharge.  Pt educated on  daily weights and low sodium intake [name], RN 09/15/2017, 3:23 PM [phone]

## 2017-09-15 NOTE — Progress Notes (Signed)
9 Days Post-Op Procedure(s) (LRB): INSERTION OF IMPLANTABLE LEFT VENTRICULAR ASSIST DEVICE - HM3 (N/A) TRANSESOPHAGEAL ECHOCARDIOGRAM (TEE) (N/A) Subjective: Patient doing well on subacute unit Mixed venous saturation greater than 70% and milrinone reduced to 0.25 Maintaining sinus rhythm PA-lateral chest x-ray performed today is clear Sternal, abdominal incisions healing well INR 2.8-- will remove epicardial pacing wires when INR less than 2.5 Patient and family education process, hopefully arrangements will be finalized for discharge next week Echo ramp study to be done before discharge Objective: Vital signs in last 24 hours: Temp:  [97.8 F (36.6 C)-98.8 F (37.1 C)] 98.3 F (36.8 C) (05/02 0916) Pulse Rate:  [98-112] 104 (05/02 0916) Cardiac Rhythm: Sinus tachycardia (05/02 0700) Resp:  [15-28] 19 (05/02 0916) BP: (76-91)/(46-70) 88/65 (05/02 0917) SpO2:  [93 %-100 %] 100 % (05/02 0916) Weight:  [177 lb 11.1 oz (80.6 kg)] 177 lb 11.1 oz (80.6 kg) (05/02 0602)  Hemodynamic parameters for last 24 hours: CVP:  [10 mmHg] 10 mmHg  Intake/Output from previous day: 05/01 0701 - 05/02 0700 In: 1123.2 [P.O.:840; I.V.:283.2] Out: 195 [Urine:195] Intake/Output this shift: Total I/O In: -  Out: 300 [Urine:300]  Alert and comfortable Lungs clear Neck without JVD Sternal incision clean and dry Abdomen soft Extremities warm Power cord exit site dressing clean and dry  Lab Results: Recent Labs    09/14/17 0749 09/15/17 0430  WBC 23.1* 20.0*  HGB 9.3* 9.1*  HCT 29.4* 29.1*  PLT 430* 431*   BMET:  Recent Labs    09/14/17 0330 09/15/17 0430  NA 129* 129*  K 4.4 4.1  CL 86* 88*  CO2 33* 32  GLUCOSE 101* 74  BUN 27* 26*  CREATININE 0.98 0.95  CALCIUM 9.1 8.9    PT/INR:  Recent Labs    09/15/17 0430  LABPROT 29.4*  INR 2.81   ABG    Component Value Date/Time   PHART 7.405 09/08/2017 0049   HCO3 23.4 09/08/2017 0049   TCO2 32 09/12/2017 1605   ACIDBASEDEF 1.0 09/08/2017 0049   O2SAT 71.5 09/15/2017 0431   CBG (last 3)  Recent Labs    09/14/17 1641 09/14/17 2105 09/15/17 0813  GLUCAP 127* 110* 85    Assessment/Plan: S/P Procedure(s) (LRB): INSERTION OF IMPLANTABLE LEFT VENTRICULAR ASSIST DEVICE - HM3 (N/A) TRANSESOPHAGEAL ECHOCARDIOGRAM (TEE) (N/A) We will remove EPW's when INR less than 2.5 Milrinone wean per heart failure service  LOS: 21 days    Kathlee Nations Trigt III 09/15/2017

## 2017-09-16 ENCOUNTER — Ambulatory Visit (HOSPITAL_COMMUNITY): Payer: Medicaid Other

## 2017-09-16 DIAGNOSIS — I340 Nonrheumatic mitral (valve) insufficiency: Secondary | ICD-10-CM

## 2017-09-16 LAB — GLUCOSE, CAPILLARY
GLUCOSE-CAPILLARY: 124 mg/dL — AB (ref 65–99)
Glucose-Capillary: 113 mg/dL — ABNORMAL HIGH (ref 65–99)
Glucose-Capillary: 99 mg/dL (ref 65–99)
Glucose-Capillary: 109 mg/dL — ABNORMAL HIGH (ref 65–99)

## 2017-09-16 LAB — COOXEMETRY PANEL
Carboxyhemoglobin: 2.7 % — ABNORMAL HIGH (ref 0.5–1.5)
Methemoglobin: 0.9 % (ref 0.0–1.5)
O2 Saturation: 72.5 %
Total hemoglobin: 9.5 g/dL — ABNORMAL LOW (ref 12.0–16.0)

## 2017-09-16 LAB — CBC
HCT: 29 % — ABNORMAL LOW (ref 39.0–52.0)
Hemoglobin: 9.2 g/dL — ABNORMAL LOW (ref 13.0–17.0)
MCH: 28 pg (ref 26.0–34.0)
MCHC: 31.7 g/dL (ref 30.0–36.0)
MCV: 88.4 fL (ref 78.0–100.0)
Platelets: 418 10*3/uL — ABNORMAL HIGH (ref 150–400)
RBC: 3.28 MIL/uL — ABNORMAL LOW (ref 4.22–5.81)
RDW: 15.4 % (ref 11.5–15.5)
WBC: 15.9 10*3/uL — ABNORMAL HIGH (ref 4.0–10.5)

## 2017-09-16 LAB — COMPREHENSIVE METABOLIC PANEL
ALT: 29 U/L (ref 17–63)
AST: 43 U/L — ABNORMAL HIGH (ref 15–41)
Albumin: 2.5 g/dL — ABNORMAL LOW (ref 3.5–5.0)
Alkaline Phosphatase: 91 U/L (ref 38–126)
Anion gap: 8 (ref 5–15)
BUN: 26 mg/dL — ABNORMAL HIGH (ref 6–20)
CO2: 31 mmol/L (ref 22–32)
Calcium: 8.8 mg/dL — ABNORMAL LOW (ref 8.9–10.3)
Chloride: 90 mmol/L — ABNORMAL LOW (ref 101–111)
Creatinine, Ser: 1.04 mg/dL (ref 0.61–1.24)
GFR calc Af Amer: >60 mL/min (ref >60)
GFR calc non Af Amer: >60 mL/min (ref >60)
Glucose, Bld: 198 mg/dL — ABNORMAL HIGH (ref 65–99)
Potassium: 3.9 mmol/L (ref 3.5–5.1)
Sodium: 129 mmol/L — ABNORMAL LOW (ref 135–145)
Total Bilirubin: 1 mg/dL (ref 0.3–1.2)
Total Protein: 7.7 g/dL (ref 6.5–8.1)

## 2017-09-16 LAB — PROTIME-INR
INR: 1.84
INR: 1.82
Prothrombin Time: 21.1 seconds — ABNORMAL HIGH (ref 11.4–15.2)
Prothrombin Time: 20.9 seconds — ABNORMAL HIGH (ref 11.4–15.2)

## 2017-09-16 LAB — ECHOCARDIOGRAM LIMITED
HEIGHTINCHES: 74 in
Weight: 2804.25 oz

## 2017-09-16 LAB — LACTATE DEHYDROGENASE: LDH: 243 U/L — ABNORMAL HIGH (ref 98–192)

## 2017-09-16 MED ORDER — ENSURE ENLIVE PO LIQD
237.0000 mL | Freq: Two times a day (BID) | ORAL | Status: DC
  Administered 2017-09-16 – 2017-09-19 (×4): 237 mL via ORAL

## 2017-09-16 MED ORDER — WARFARIN SODIUM 3 MG PO TABS
6.0000 mg | ORAL_TABLET | Freq: Once | ORAL | Status: AC
  Administered 2017-09-16: 6 mg via ORAL
  Filled 2017-09-16: qty 2

## 2017-09-16 NOTE — Progress Notes (Signed)
Patients manual Map tonight was 68 and 66, talked with Dr. Mean on call tonight and he stated to give Sildenafil and Entresto. Patient resting comfortably in bed. CVP at 2000 was 10. Will continue to monitor closely.

## 2017-09-16 NOTE — Progress Notes (Signed)
Pacing wire pulled out per MD orders.... Pt tolerated well.   Vitals Q15 x4.... RN to monitor.

## 2017-09-16 NOTE — Progress Notes (Signed)
CARDIAC REHAB PHASE I   Went to see patient for walk but he was in the middle of echo and then education with VAD coordinator.  Cardiac Rehab will see tomorrow.  Nikki Dom, RN 09/16/2017 2:49 PM

## 2017-09-16 NOTE — Progress Notes (Signed)
CVP 8

## 2017-09-16 NOTE — Progress Notes (Signed)
Pt states MD told him he cannot have his pacing wires removed until he has an ultrasound completed; no ultrasound scheduled so far but order in to remove pacing wires--paged MD.   [place]  Nero Sawatzky, RN

## 2017-09-16 NOTE — Progress Notes (Signed)
  Echocardiogram 2D Echocardiogram has been performed.  Jonathan Wood F 09/16/2017, 3:41 PM

## 2017-09-16 NOTE — Progress Notes (Addendum)
Advanced Heart Failure/VAD Rounding Note  PCP-Cardiologist: No primary care provider on file.   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   Extubated 09/07/17.  Coox 72.5% on milrinone 0.25 mcg/kg/min. CVP 4-5.   Feeling good today. Had mild pocket pain while walking yesterday, but otherwise pain is well controlled. Denies SOB. No lightheadedness or dizziness walking halls. Feels stronger. No dizziness.  INR 2.8 -> 1.82 (Repeated for confirmation)  LVAD Interrogation HM 3: Speed: 5400 Flow: 5.2 PI: 2.4 Power: 4.0. Occasional PI events  Objective:   Weight Range: 175 lb 4.3 oz (79.5 kg) Body mass index is 22.5 kg/m.   Vital Signs:   Temp:  [97.6 F (36.4 C)-99.7 F (37.6 C)] 98.3 F (36.8 C) (05/03 0756) Pulse Rate:  [99-114] 99 (05/03 0756) Resp:  [13-27] 27 (05/03 0756) BP: (82-105)/(54-70) 90/65 (05/03 0756) SpO2:  [92 %-100 %] 98 % (05/03 0756) Weight:  [175 lb 4.3 oz (79.5 kg)] 175 lb 4.3 oz (79.5 kg) (05/03 0700) Last BM Date: 09/14/17  Weight change: Filed Weights   09/14/17 0648 09/15/17 0602 09/16/17 0700  Weight: 187 lb 2.7 oz (84.9 kg) 177 lb 11.1 oz (80.6 kg) 175 lb 4.3 oz (79.5 kg)    Intake/Output:   Intake/Output Summary (Last 24 hours) at 09/16/2017 9629 Last data filed at 09/16/2017 0700 Gross per 24 hour  Intake 829.56 ml  Output 1775 ml  Net -945.44 ml     Physical Exam   GENERAL: Well appearing this am. NAD.  HEENT: Normal. Anicteric NECK: Supple, JVP 7-8 cm. Carotids OK.  CARDIAC:  Incision well healing. Mechanical heart sounds with LVAD hum present. Pacing wires in place.  LUNGS:  CTAB, normal effort.  No wheeze  ABDOMEN:  NT, ND, no HSM. No bruits or masses. +BS  LVAD exit site: Dressing dry and intact. No erythema or drainage. Stabilization device present and accurately applied. Driveline dressing changed daily per sterile technique. EXTREMITIES:  Warm and dry. No cyanosis, clubbing, rash, or edema. RUE PICC Neuro: alert & oriented x 3,  cranial nerves grossly intact. moves all 4 extremities w/o difficulty. Affect pleasant  Telemetry   NSR/ST 90-100s, personally reviewed.   EKG    No new tracings.    Labs    CBC Recent Labs    09/15/17 0430 09/16/17 0500  WBC 20.0* 15.9*  HGB 9.1* 9.2*  HCT 29.1* 29.0*  MCV 88.2 88.4  PLT 431* 418*   Basic Metabolic Panel Recent Labs    52/84/13 0430 09/16/17 0500  NA 129* 129*  K 4.1 3.9  CL 88* 90*  CO2 32 31  GLUCOSE 74 198*  BUN 26* 26*  CREATININE 0.95 1.04  CALCIUM 8.9 8.8*   Liver Function Tests Recent Labs    09/15/17 0430 09/16/17 0500  AST 41 43*  ALT 26 29  ALKPHOS 90 91  BILITOT 0.9 1.0  PROT 7.7 7.7  ALBUMIN 2.6* 2.5*   No results for input(s): LIPASE, AMYLASE in the last 72 hours. Cardiac Enzymes No results for input(s): CKTOTAL, CKMB, CKMBINDEX, TROPONINI in the last 72 hours.  BNP: BNP (last 3 results) Recent Labs    08/25/17 1337 09/07/17 0411 09/13/17 0348  BNP 2,510.0* 272.7* 233.6*    ProBNP (last 3 results) No results for input(s): PROBNP in the last 8760 hours.   D-Dimer No results for input(s): DDIMER in the last 72 hours. Hemoglobin A1C No results for input(s): HGBA1C in the last 72 hours. Fasting Lipid Panel  No results for input(s): CHOL, HDL, LDLCALC, TRIG, CHOLHDL, LDLDIRECT in the last 72 hours. Thyroid Function Tests No results for input(s): TSH, T4TOTAL, T3FREE, THYROIDAB in the last 72 hours.  Invalid input(s): FREET3  Other results:   Imaging    Dg Chest 2 View  Result Date: 09/15/2017 CLINICAL DATA:  47 year old male postoperative day 9 status post LVAD implantation. EXAM: CHEST - 2 VIEW COMPARISON:  09/14/2017 and earlier. FINDINGS: Upright AP and lateral views of the chest. Lower than normal lung volumes with persistent infrahilar and retrocardiac opacity which most resembles atelectasis. Stable cardiomegaly and mediastinal contours. Stable right PICC line. No pneumothorax or pulmonary edema. Small  pleural effusions are possible. Visible LVAD hardware appears stable. Epicardial pacer wires in place. Negative visible bowel gas pattern. IMPRESSION: 1. Low lung volumes with patchy lower lung opacity favored to be atelectasis. Small pleural effusions are possible. 2. No pulmonary edema.  Stable cardiomegaly. Electronically Signed   By: Odessa Fleming M.D.   On: 09/15/2017 09:41     Medications:     Scheduled Medications: . amiodarone  200 mg Oral BID  . aspirin EC  81 mg Oral Daily  . Chlorhexidine Gluconate Cloth  6 each Topical Daily  . citalopram  10 mg Oral Daily  . feeding supplement (ENSURE ENLIVE)  237 mL Oral BID BM  . gabapentin  100 mg Oral TID  . insulin aspart  0-15 Units Subcutaneous TID WC  . insulin aspart  0-5 Units Subcutaneous QHS  . insulin detemir  14 Units Subcutaneous BID  . pantoprazole  40 mg Oral Daily  . polyethylene glycol  17 g Oral Daily  . sacubitril-valsartan  1 tablet Oral BID  . senna-docusate  2 tablet Oral QHS  . sildenafil  20 mg Oral TID  . sodium chloride flush  10-40 mL Intracatheter Q12H  . torsemide  40 mg Oral Daily  . traZODone  50 mg Oral QHS  . Warfarin - Pharmacist Dosing Inpatient   Does not apply q1800    Infusions: . sodium chloride 5 mL/hr at 09/15/17 0300  . lactated ringers Stopped (09/06/17 1623)  . milrinone 0.25 mcg/kg/min (09/16/17 0400)    PRN Medications: ALPRAZolam, MUSCLE RUB, ondansetron (ZOFRAN) IV, oxyCODONE, sodium chloride, sodium chloride flush, traMADol    Patient Profile   Jonathan Wood is 47 year old with a history of NICM, HTN, ETOH abuse, smoker, asthma, and chronic systolic heart failure.   Assessment/Plan   1. Cardiogenic Shock- NICM had LHC 2018 with normal cors.  - ECHO 08/26/2017 EF 5-10%. RV moderately reduced.   - s/p HM 3 LVAD 09/06/17   - Co-ox 72.5% this am on milrinone 0.25 mcg/kg/min. Will cut back to 0.125 mcg/kg/min and follow.  - Volume status stable on torsemide 40 mg daily.  - Continue  sildenafil 20 mg tid for RV failure.  - Continue entresto 24-26 mg TID for now. MAPs in 70s/low 80s  - LDH 243 - INR 2.81 -> 1.82. Discussed with pharm-D personally.  - Driveline site stable.  - Smoker- Not a candidate for transplant. Needs 6 months off nicotine.  - Blood Type B+  - Continue neurontin for pocket pain.   2. Leukocytosis.  - WBC 23.1 -> 20.0 -> 15.9. Afebrile.  - on zosyn. CXR 4/29 stable - Encouraged IS.  - Reculture as needed.   3. Post op anemia - 4/27 1u RBCs - Hgb stable at 9.2 today.   4. Shock Liver - Resolved.  5. AKI  -  Resolved.    6. Hypokalemia - Stable. K 3.9 this am.     7.  Tobacco Abuse  - Encouraged complete cessation on discharge.  - Needs 6 months off for transplant consideration.  - No change to current plan.    8. Gout - Uric Acid 14.5 - Completed 3 days of steroids. Now on allopurinol - Stable  9. Anxiety - Continue xanax and sertraline on 4/17.  - Improved on Celexa and Xanax.  - No change to current plan.    Plan Ramp Echo today. Milrinone as above.   I reviewed the LVAD parameters from today, and compared the results to the patient's prior recorded data.  No programming changes were made.  The LVAD is functioning within specified parameters.  The patient performs LVAD self-test daily.  LVAD interrogation was negative for any significant power changes, alarms or PI events/speed drops.  LVAD equipment check completed and is in good working order.  Back-up equipment present.   LVAD education done on emergency procedures and precautions and reviewed exit site care.   Length of Stay: 22  Jonathan Wood, [place]  8:22 AM   Advanced Heart Failure Team Pager [phone] (M-F; 7a - 4p)  Please contact [facility] for night-coverage after hours (4p -7a ) and weekends on [email or web address]  Patient seen and examined with the above-signed Advanced Practice Provider and/or Housestaff. I personally reviewed laboratory data, imaging  studies and relevant notes. I independently examined the patient and formulated the important aspects of the plan. I have edited the note to reflect any of my changes or salient points. I have personally discussed the plan with the patient and/or family.  Continues to improve slowly. Still requiring milrinone for RV support. CO-ox ok. Will wean milrinone to 0.125. Hopefully stop over the weekend. CVP ok. Continue po torsemide. Ramp echo today and speed adjusted to 5600. There is moderate RV dysfunction. INR dropped to 1.8. Discussed dosing with PharmD personally. Start heparin )no bolus) if INR goes below 1.6. Continue to mobilize. Pacing wires out today. VAD interrogated personally. Parameters stable.   Arvilla Meres, MD  3:40 PM

## 2017-09-16 NOTE — Progress Notes (Addendum)
HeartMate 3 Rounding Note postop day #10  Subjective:    47 year old AA  male with nonischemic cardiomyopathy, admitted with recurrent heart failure and cardiogenic shock with preoperative double inotropes and balloon pump.  Preoperative moderate to severe RV dysfunction, no PFO on preoperative echo bubble study   implantation of HeartMate 3 left ventricular assist device April 23.  Preoperative intra-aortic balloon pump removed. Extubated POD #1   Postop HeartMate 3 implantation with stable hemodynamics and good VAD flow parameters and  pulsatility index..  RV function postop supported with milrinone - .  Maintaining sinus rhythm on po amiodarone   MAP now improved  on entresto, revatio with bettter VAD flow 5 L   PA catheter removed on postop day 2 and patient mobilized to chair without difficulty.Now ambulating in hall several hundred feet.Moved out of ICU POD#9  Patient has had significant issues with surgical pain, but now improving. Fentanyl stopped   Patient now at preop weight , CVP 10  Expected postop blood loss anemia hemoglobin8 - patient has received 2 units  PRBCs postop WBC elevated w/o fever- re culture urine, blood negative. WBC slowly decreasing. Surgical incisins clean, dry. Finished 1 week Zosyn  Repeat co-ox 65%- wean milrinone per HF service  LVAD INTERROGATION:  HeartMate 3 LVAD:  Flow 5.2 liters/min, speed 5400 RPM, power 4.2, PI 2.4.  Controller intact.   Objective:    Vital Signs:   Temp:  [97.6 F (36.4 C)-99.7 F (37.6 C)] 97.7 F (36.5 C) (05/03 1145) Pulse Rate:  [70-110] 70 (05/03 1145) Resp:  [13-27] 26 (05/03 1145) BP: (75-105)/(52-70) 75/52 (05/03 1145) SpO2:  [92 %-100 %] 98 % (05/03 1145) Weight:  [175 lb 4.3 oz (79.5 kg)] 175 lb 4.3 oz (79.5 kg) (05/03 0700) Last BM Date: 09/14/17 Mean arterial Pressure 75--85 mmHg mean arterial pressure  Intake/Output:   Intake/Output Summary (Last 24 hours) at 09/16/2017 1233 Last data filed at  09/16/2017 1147 Gross per 24 hour  Intake 939.56 ml  Output 2375 ml  Net -1435.44 ml     Physical Exam: General:  Well appearing. No resp difficulty.  Breathing comfortably extubated HEENT: normal Neck: supple. JVP . Carotids 2+ bilat; no bruits. No lymphadenopathy or thryomegaly appreciated. Cor: Mechanical heart sounds with LVAD hum present. Lungs: clear Abdomen: soft, nontender, nondistended. No hepatosplenomegaly. No bruits or masses. Good bowel sounds. Extremities: no cyanosis, clubbing, rash, edema Neuro: alert & orientedx3, cranial nerves grossly intact. moves all 4 extremities w/o difficulty. Affect pleasant  Telemetry: Sinus rhythm  Labs: Basic Metabolic Panel: Recent Labs  Lab 09/10/17 0423  09/11/17 0214  09/12/17 0449 09/12/17 1605 09/13/17 0348 09/14/17 0330 09/15/17 0430 09/16/17 0500  NA 130*   < > 130*   < > 131* 127* 128* 129* 129* 129*  K 3.3*   < > 3.8   < > 3.6 3.9 3.6 4.4 4.1 3.9  CL 92*   < > 90*   < > 87* 84* 81* 86* 88* 90*  CO2 28  --  29  --  35*  --  36* 33* 32 31  GLUCOSE 114*   < > 132*   < > 167* 139* 117* 101* 74 198*  BUN 23*   < > 26*   < > 27* 26* 25* 27* 26* 26*  CREATININE 1.21   < > 1.24   < > 1.05 1.00 0.95 0.98 0.95 1.04  CALCIUM 8.6*  --  8.8*  --  9.0  --  9.3 9.1  8.9 8.8*  MG 2.1  --  2.2  --  2.3  --  2.1  --   --   --   PHOS 3.6  --   --   --   --   --   --   --   --   --    < > = values in this interval not displayed.    Liver Function Tests: Recent Labs  Lab 09/12/17 0449 09/13/17 0348 09/14/17 0330 09/15/17 0430 09/16/17 0500  AST 29 27 28  41 43*  ALT 18 18 19 26 29   ALKPHOS 78 70 77 90 91  BILITOT 1.2 1.4* 1.3* 0.9 1.0  PROT 7.5 7.2 7.8 7.7 7.7  ALBUMIN 2.5* 2.5* 2.6* 2.6* 2.5*   No results for input(s): LIPASE, AMYLASE in the last 168 hours. No results for input(s): AMMONIA in the last 168 hours.  CBC: Recent Labs  Lab 09/10/17 0423  09/11/17 0214  09/12/17 0449 09/12/17 1605 09/13/17 0348  09/14/17 0749 09/15/17 0430 09/16/17 0500  WBC 17.5*  --  18.5*  --  18.1*  --  23.0* 23.1* 20.0* 15.9*  NEUTROABS 12.5*  --  13.7*  --  13.5*  --  17.8*  --   --   --   HGB 7.1*   < > 8.0*   < > 8.8* 10.5* 9.3* 9.3* 9.1* 9.2*  HCT 21.8*   < > 24.3*   < > 27.3* 31.0* 28.7* 29.4* 29.1* 29.0*  MCV 87.9  --  86.8  --  87.2  --  87.2 88.3 88.2 88.4  PLT 217  --  247  --  338  --  361 430* 431* 418*   < > = values in this interval not displayed.    INR: Recent Labs  Lab 09/13/17 0348 09/14/17 0330 09/15/17 0430 09/16/17 0500 09/16/17 0702  INR 2.42 2.68 2.81 1.84 1.82    Other results:  EKG:   Imaging: Dg Chest 2 View  Result Date: 09/15/2017 CLINICAL DATA:  47 year old male postoperative day 9 status post LVAD implantation. EXAM: CHEST - 2 VIEW COMPARISON:  09/14/2017 and earlier. FINDINGS: Upright AP and lateral views of the chest. Lower than normal lung volumes with persistent infrahilar and retrocardiac opacity which most resembles atelectasis. Stable cardiomegaly and mediastinal contours. Stable right PICC line. No pneumothorax or pulmonary edema. Small pleural effusions are possible. Visible LVAD hardware appears stable. Epicardial pacer wires in place. Negative visible bowel gas pattern. IMPRESSION: 1. Low lung volumes with patchy lower lung opacity favored to be atelectasis. Small pleural effusions are possible. 2. No pulmonary edema.  Stable cardiomegaly. Electronically Signed   By: Odessa Fleming M.D.   On: 09/15/2017 09:41     Medications:     Scheduled Medications: . amiodarone  200 mg Oral BID  . aspirin EC  81 mg Oral Daily  . Chlorhexidine Gluconate Cloth  6 each Topical Daily  . citalopram  10 mg Oral Daily  . gabapentin  100 mg Oral TID  . insulin aspart  0-15 Units Subcutaneous TID WC  . insulin aspart  0-5 Units Subcutaneous QHS  . insulin detemir  14 Units Subcutaneous BID  . pantoprazole  40 mg Oral Daily  . polyethylene glycol  17 g Oral Daily  .  sacubitril-valsartan  1 tablet Oral BID  . senna-docusate  2 tablet Oral QHS  . sildenafil  20 mg Oral TID  . sodium chloride flush  10-40 mL Intracatheter Q12H  .  torsemide  40 mg Oral Daily  . traZODone  50 mg Oral QHS  . Warfarin - Pharmacist Dosing Inpatient   Does not apply q1800    Infusions: . sodium chloride 5 mL/hr at 09/15/17 0300  . lactated ringers Stopped (09/06/17 1623)  . milrinone 0.125 mcg/kg/min (09/16/17 0933)    PRN Medications: ALPRAZolam, MUSCLE RUB, ondansetron (ZOFRAN) IV, oxyCODONE, sodium chloride, sodium chloride flush, traMADol   Assessment:  Nonischemic cardiomyopathy, cardiogenic shock Implantation of HeartMate 3 left ventricular assist device April 23 Stable LVAD flow parameters and hemodynamics on milrinone 0.125 Preoperative and postoperative RV dysfunction requiring inotropic support  Plan/Discussion:   Patient progressing, ambulated > 300 ft feet with physical therapy.  Maintaining sinus rhythm Postop RV dysfunction requiring IV milrinone - now weaned down to 0.125       ramp echo today to set pre-discharge speed Coumadin  per pharm D WBC  With persistent elevation -  reculture negative, prob stress reaction   Coumadin per pharmD Remove EPWs today for INR 1.8 Home next week off milrinone      I reviewed the LVAD parameters from today, and compared the results to the patient's prior recorded data.  No programming changes were made.  The LVAD is functioning within specified parameters.  The patient performs LVAD self-test daily.  LVAD interrogation was negative for any significant power changes, alarms or PI events/speed drops.  LVAD equipment check completed and is in good working order.  Back-up equipment present.   LVAD education done on emergency procedures and precautions and reviewed exit site care.  Length of Stay: [address]  Kathlee Nations [place] III 09/16/2017, 12:33 PM

## 2017-09-16 NOTE — Progress Notes (Signed)
ANTICOAGULATION CONSULT NOTE - Follow-Up Consult  Pharmacy Consult for warfarin Indication: LVAD  No Known Allergies  Patient Measurements: Height: 6\' 2"  (188 cm) Weight: 175 lb 4.3 oz (79.5 kg) IBW/kg (Calculated) : 82.2  Vital Signs: Temp: 97.7 F (36.5 C) (05/03 1145) Temp Source: Tympanic (05/03 1145) BP: 75/52 (05/03 1145) Pulse Rate: 70 (05/03 1145)  Labs: Recent Labs    09/14/17 0330  09/14/17 0749 09/15/17 0430 09/16/17 0500 09/16/17 0702  HGB  --    < > 9.3* 9.1* 9.2*  --   HCT  --   --  29.4* 29.1* 29.0*  --   PLT  --   --  430* 431* 418*  --   LABPROT 28.3*  --   --  29.4* 21.1* 20.9*  INR 2.68  --   --  2.81 1.84 1.82  CREATININE 0.98  --   --  0.95 1.04  --    < > = values in this interval not displayed.    Estimated Creatinine Clearance: 101.9 mL/min (by C-G formula based on SCr of 1.04 mg/dL).   Medical History: Past Medical History:  Diagnosis Date  . Asthma   . CHF (congestive heart failure) (HCC)    a. 09/2016: EF 20-25% with cath showing normal cors  . GERD (gastroesophageal reflux disease)   . History of hiatal hernia     Assessment: 31 yoM s/p HM3 on 4/23 on warfarin per pharmacy. INR subtherapeutic at 1.82 s/p reduced doses x2 in setting of elevated INR and need to pull EP wires. Per MD, no need for heparin bridge unless INR is < 1.6. Will give boosted dose tonight to prevent further drop.  Goal of Therapy:  INR 2-2.5 Monitor platelets by anticoagulation protocol: Yes   Plan:  -Warfarin 6mg  PO x1 tonight -Daily INR  Fredonia Highland, PharmD, BCPS PGY-2 Cardiology Pharmacy Resident Pager: [phone] 09/16/2017

## 2017-09-16 NOTE — Progress Notes (Signed)
LVAD Coordinator Rounding Note:  Admitted 08/25/17 due to CHF. Dr. [name] consulted for Advanced Heart Failure. VAD evaluation completed.   HM III LVAD implanted on 09/07/17 by 09/06/17 under Destination Therapy criteria due to current smoking history.  Pt sitting up in the chair. States he has walked around the unit this morning. Pt is requesting to go back to bed. Pt informed that he needs to try and stay in the chair at least an hour. Pt is requesting pain medication.   Vital signs: Temp:  98.3 HR:  104 Auto cuff: 90/65(74) Doppler: not done O2 Sat: 98% on RA Wt: 189>195>200>201>194>188>187>177>175 lbs    LVAD interrogation reveals:  Speed: 5400 Flow: 5.0 Power: 4.1 PI:  3.2 Alarms: none Events: none  Hematocrit:  29 Fixed speed: 5400 Low speed limit: 5100  Drive Line: Dressing intact and anchor secure. Will observe caregiver changing the dressing today.  Labs:  LDH trend: 420>483>404>367>360>353>342>317>290>243  INR trend: 1.38>1.58>1.41>1.61>2.21>2.13>2.42>2.68>2.8>1.84  Hgb trend: 8.5>8.2>8.3>7.3>8.8>9.3>9.3>9.1>9.2  Co-ox trend: 68>76>59>58>54>65>71.5>72   Anticoagulation Plan: -INR Goal: 2.0 - 2.5 -ASA Dose: 81 mg; started 09/11/17  Blood Products:  Intra op: - 1 unit plts - 2 FFP - 250 cell saver  Post-op: 1 unit PRBC-09/10/17  Device: N/A  NO: off 09/10/17  Gtts: - Levo>off 4/26 - Milrinone .25 mcg/kg/min - Epi 1 mcg/min off 4/28 - Amio 30 mg/hr off 4/26 - Lasix 10 mg/hr off 4/28  Arrhythmias: - Sinus tach 09/07/17 in 130's; started amiodarone (no bolus) trying to prevent post-op Afib  VAD Education:  VAD discharge binder at bedside. Asked pt to complete reading. His caregiver will need to read/review as well. Discharge teaching with him and [name] scheduled for today at 1430.   Adverse Events on VAD: -none  Plan/Recommendations:  1. Daily dressing change per Nurse Alla Feeling, VAD Coordinator or Kristy-caregiver. [name] will change  today. 2. RAMP echo today at 1400. 3. Discharge teaching scheduled for today at 1430. 4. Spoke with [name] and we are planning to discharge the patient to his mother's home.  5. Please call VAD pager if any VAD equipment or drive line issues.  Carlton Adam RN, VAD Coordinator 24/7 VAD pager: [phone]

## 2017-09-16 NOTE — Progress Notes (Signed)
CVP bag set up per protocol to change. RN at the bedside aware

## 2017-09-16 NOTE — Progress Notes (Signed)
CVP 10 

## 2017-09-16 NOTE — Progress Notes (Signed)
CCMD called and said pt had 7 beat run multifocal vtach. LVAD coordinator at bedside while echo being completed.   [place]  Jahnae Mcadoo, RN

## 2017-09-16 NOTE — Progress Notes (Signed)
Physical Therapy Treatment Patient Details Name: DEVIAN BARTOLOMEI MRN: [id number] DOB: 11-15-1970 Today's Date: 09/16/2017    History of Present Illness The patient is a 47 year old African American male, who presented to the hospital with fatigue, edema, dizziness, and cardiogenic shock. 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 Extubated 4/24. PMHx: Asthma, CHF, GERD, and History of hiatal hernia.    PT Comments    Pt pleasant and able to recall items for backup bag but needs cues to recall spare controller. Pt able to transition to battery without assist with cues only to untwist lines before placing in holster. Pt educated for progression and able to walk without RW and O2 today with SpO2  88-94% on RA with sats maintained above 90% with pursed lip breathing. Pt continues to progress and have difficulty recalling all precautions.  Pt also reports pocket pain today.  HR 102-110 5400speed 5.1-5.3 flow 4.0-4.1 power 2.9-3.1 PI BP 90/65 (74)    Follow Up Recommendations  Home health PT;Supervision/Assistance - 24 hour     Equipment Recommendations  3in1 (PT);Other (comment)(rollator)    Recommendations for Other Services       Precautions / Restrictions Precautions Precautions: Sternal Precaution Comments: verbally reviewed precautions with patient, LVAD    Mobility  Bed Mobility Overal bed mobility: Needs Assistance Bed Mobility: Supine to Sit     Supine to sit: Supervision     General bed mobility comments: supervision for lines, HOB 25 degrees, no physical assist  Transfers Overall transfer level: Needs assistance   Transfers: Sit to/from Stand Sit to Stand: Supervision         General transfer comment: cues for hand placement, pt able to scoot to edge and stand much easier today  Ambulation/Gait Ambulation/Gait assistance: Min guard Ambulation Distance (Feet): 350 Feet Assistive device: None Gait Pattern/deviations: Step-through pattern;Decreased stride  length   Gait velocity interpretation: <1.8 ft/sec, indicate of risk for recurrent falls General Gait Details: cues for posture and 2 standing rest breaks with cues for breathing technique   Stairs             Wheelchair Mobility    Modified Rankin (Stroke Patients Only)       Balance     Sitting balance-Leahy Scale: Good       Standing balance-Leahy Scale: Good                              Cognition Arousal/Alertness: Awake/alert Behavior During Therapy: Flat affect Overall Cognitive Status: Impaired/Different from baseline Area of Impairment: Memory                     Memory: Decreased recall of precautions         General Comments: pt with better spirits today, able to transition power but continues to need cues for all precautions      Exercises General Exercises - Lower Extremity Long Arc Quad: AROM;15 reps;Seated;Both Hip Flexion/Marching: AROM;15 reps;Seated;Both    General Comments        Pertinent Vitals/Pain Pain Score: 4  Pain Location: pocket pain Pain Descriptors / Indicators: Aching;Sore Pain Intervention(s): Limited activity within patient's tolerance;Repositioned    Home Living                      Prior Function            PT Goals (current goals can now be found in the care plan section)  Progress towards PT goals: Progressing toward goals    Frequency           PT Plan Current plan remains appropriate    Co-evaluation              AM-PAC PT "6 Clicks" Daily Activity  Outcome Measure  Difficulty turning over in bed (including adjusting bedclothes, sheets and blankets)?: A Little Difficulty moving from lying on back to sitting on the side of the bed? : A Little Difficulty sitting down on and standing up from a chair with arms (e.g., wheelchair, bedside commode, etc,.)?: A Little Help needed moving to and from a bed to chair (including a wheelchair)?: A Little Help needed  walking in hospital room?: A Little Help needed climbing 3-5 steps with a railing? : A Little 6 Click Score: 18    End of Session Equipment Utilized During Treatment: Gait belt Activity Tolerance: Patient tolerated treatment well Patient left: in chair;with call bell/phone within reach Nurse Communication: Mobility status;Precautions PT Visit Diagnosis: Other abnormalities of gait and mobility (R26.89)     Time: 3557-3220 PT Time Calculation (min) (ACUTE ONLY): 34 min  Charges:  $Gait Training: 8-22 mins $Therapeutic Activity: 8-22 mins                    G Codes:       [name], PT [phone]    Tammy Wickliffe B Lianah Peed 09/16/2017, 8:56 AM

## 2017-09-16 NOTE — Progress Notes (Signed)
Speed  Flow  PI  Power  LVIDD  AI  Aortic openings  MR  TR  Septum  RV   5400 5.2 2.8 4.1 5.05 none 1/5  trivial/mild  trivial/mild  Slight bow to R Mod hypo   5500  5.4 2.8 4.2 5.5 trivial 0/5 Trivial/mild Trivial/mild Slight bow R Mod hypo   5600 5.5 2.8 4.4 5.6 trace 2/5 trace trace midline normal  5700 5.8 3.2 4.6 5.8 96 0/5 trivial trivial Tug to L                              Doppler MAP: 67   Ramp ECHO performed at bedside.  At completion of ramp study:  Fixed speed: 5600 Low speed limit: 5300  Carlton Adam RN, VAD Coordinator 24/7 pager [phone]

## 2017-09-16 NOTE — Progress Notes (Signed)
MD confirmed to take pacing wires out; will clarify with pt and have a 2H nurse remove wires.   [place]  Zannie Locastro, RN

## 2017-09-17 LAB — BASIC METABOLIC PANEL
Anion gap: 8 (ref 5–15)
BUN: 23 mg/dL — ABNORMAL HIGH (ref 6–20)
CO2: 30 mmol/L (ref 22–32)
Calcium: 8.6 mg/dL — ABNORMAL LOW (ref 8.9–10.3)
Chloride: 91 mmol/L — ABNORMAL LOW (ref 101–111)
Creatinine, Ser: 0.88 mg/dL (ref 0.61–1.24)
GFR calc Af Amer: >60 mL/min (ref >60)
GFR calc non Af Amer: >60 mL/min (ref >60)
Glucose, Bld: 131 mg/dL — ABNORMAL HIGH (ref 65–99)
Potassium: 3.7 mmol/L (ref 3.5–5.1)
Sodium: 129 mmol/L — ABNORMAL LOW (ref 135–145)

## 2017-09-17 LAB — PROTIME-INR
INR: 1.66
Prothrombin Time: 19.5 seconds — ABNORMAL HIGH (ref 11.4–15.2)

## 2017-09-17 LAB — CBC
HCT: 28.3 % — ABNORMAL LOW (ref 39.0–52.0)
Hemoglobin: 9.2 g/dL — ABNORMAL LOW (ref 13.0–17.0)
MCH: 29 pg (ref 26.0–34.0)
MCHC: 32.5 g/dL (ref 30.0–36.0)
MCV: 89.3 fL (ref 78.0–100.0)
Platelets: 410 10*3/uL — ABNORMAL HIGH (ref 150–400)
RBC: 3.17 MIL/uL — ABNORMAL LOW (ref 4.22–5.81)
RDW: 15.6 % — ABNORMAL HIGH (ref 11.5–15.5)
WBC: 17.1 10*3/uL — ABNORMAL HIGH (ref 4.0–10.5)

## 2017-09-17 LAB — GLUCOSE, CAPILLARY
GLUCOSE-CAPILLARY: 98 mg/dL (ref 65–99)
Glucose-Capillary: 110 mg/dL — ABNORMAL HIGH (ref 65–99)
Glucose-Capillary: 88 mg/dL (ref 65–99)
Glucose-Capillary: 116 mg/dL — ABNORMAL HIGH (ref 65–99)

## 2017-09-17 LAB — COOXEMETRY PANEL
Carboxyhemoglobin: 2.3 % — ABNORMAL HIGH (ref 0.5–1.5)
Methemoglobin: 0.8 % (ref 0.0–1.5)
O2 Saturation: 63.1 %
Total hemoglobin: 9.4 g/dL — ABNORMAL LOW (ref 12.0–16.0)

## 2017-09-17 LAB — LACTATE DEHYDROGENASE: LDH: 245 U/L — ABNORMAL HIGH (ref 98–192)

## 2017-09-17 MED ORDER — WARFARIN SODIUM 3 MG PO TABS
6.0000 mg | ORAL_TABLET | Freq: Once | ORAL | Status: AC
  Administered 2017-09-17: 6 mg via ORAL
  Filled 2017-09-17: qty 2

## 2017-09-17 NOTE — Progress Notes (Signed)
Patient ID: Jonathan Wood, male   DOB: 05/01/1971, 47 y.o.   MRN: [id number]     Advanced Heart Failure/VAD Rounding Note  PCP-Cardiologist: No primary care provider on file.   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   Extubated 09/07/17.  Ramp echo on 5/3, speed increased to 5600.   MAP running low in upper 60s, patient denies lightheadedness.  Feeling well overall, walked yesterday without walker or oxygen, no dyspnea walking in halls.   Coox 63% on milrinone 0.125 mcg/kg/min. CVP 7-9   INR 1.7 today.   LVAD Interrogation HM 3: Speed: 5600 Flow: 5.5 PI: 2.7 Power: 4.4. 1 PI event  Objective:   Weight Range: 176 lb 6.4 oz (80 kg) Body mass index is 22.65 kg/m.   Vital Signs:   Temp:  [97.6 F (36.4 C)-98.9 F (37.2 C)] 98.7 F (37.1 C) (05/04 0346) Pulse Rate:  [70-117] 117 (05/04 0400) Resp:  [18-32] 32 (05/04 0400) BP: (55-94)/(29-79) 55/46 (05/04 0346) SpO2:  [92 %-98 %] 94 % (05/04 0400) Weight:  [176 lb 6.4 oz (80 kg)] 176 lb 6.4 oz (80 kg) (05/04 0346) Last BM Date: 09/14/17  Weight change: Filed Weights   09/15/17 0602 09/16/17 0700 09/17/17 0346  Weight: 177 lb 11.1 oz (80.6 kg) 175 lb 4.3 oz (79.5 kg) 176 lb 6.4 oz (80 kg)    Intake/Output:   Intake/Output Summary (Last 24 hours) at 09/17/2017 0732 Last data filed at 09/17/2017 0400 Gross per 24 hour  Intake 1577.06 ml  Output 1275 ml  Net 302.06 ml     Physical Exam   GENERAL: Well appearing this am. NAD.  HEENT: Normal. NECK: Supple, JVP 7-8 cm. Carotids OK.  CARDIAC:  Mechanical heart sounds with LVAD hum present.  LUNGS:  CTAB, normal effort.  ABDOMEN:  NT, ND, no HSM. No bruits or masses. +BS  LVAD exit site: Well-healed and incorporated. Dressing dry and intact. No erythema or drainage. Stabilization device present and accurately applied. Driveline dressing changed daily per sterile technique. EXTREMITIES:  Warm and dry. No cyanosis, clubbing, rash, or edema.  NEUROLOGIC:  Alert &  oriented x 3. Cranial nerves grossly intact. Moves all 4 extremities w/o difficulty. Affect pleasant     Telemetry   NSR 90s-100s, personally reviewed   EKG    No new tracings.    Labs    CBC Recent Labs    09/16/17 0500 09/17/17 0435  WBC 15.9* 17.1*  HGB 9.2* 9.2*  HCT 29.0* 28.3*  MCV 88.4 89.3  PLT 418* 410*   Basic Metabolic Panel Recent Labs    97/98/92 0500 09/17/17 0435  NA 129* 129*  K 3.9 3.7  CL 90* 91*  CO2 31 30  GLUCOSE 198* 131*  BUN 26* 23*  CREATININE 1.04 0.88  CALCIUM 8.8* 8.6*   Liver Function Tests Recent Labs    09/15/17 0430 09/16/17 0500  AST 41 43*  ALT 26 29  ALKPHOS 90 91  BILITOT 0.9 1.0  PROT 7.7 7.7  ALBUMIN 2.6* 2.5*   No results for input(s): LIPASE, AMYLASE in the last 72 hours. Cardiac Enzymes No results for input(s): CKTOTAL, CKMB, CKMBINDEX, TROPONINI in the last 72 hours.  BNP: BNP (last 3 results) Recent Labs    08/25/17 1337 09/07/17 0411 09/13/17 0348  BNP 2,510.0* 272.7* 233.6*    ProBNP (last 3 results) No results for input(s): PROBNP in the last 8760 hours.   D-Dimer No results for input(s): DDIMER in the last  72 hours. Hemoglobin A1C No results for input(s): HGBA1C in the last 72 hours. Fasting Lipid Panel No results for input(s): CHOL, HDL, LDLCALC, TRIG, CHOLHDL, LDLDIRECT in the last 72 hours. Thyroid Function Tests No results for input(s): TSH, T4TOTAL, T3FREE, THYROIDAB in the last 72 hours.  Invalid input(s): FREET3  Other results:   Imaging    No results found.   Medications:     Scheduled Medications: . amiodarone  200 mg Oral BID  . aspirin EC  81 mg Oral Daily  . Chlorhexidine Gluconate Cloth  6 each Topical Daily  . citalopram  10 mg Oral Daily  . feeding supplement (ENSURE ENLIVE)  237 mL Oral BID BM  . gabapentin  100 mg Oral TID  . insulin aspart  0-15 Units Subcutaneous TID WC  . insulin aspart  0-5 Units Subcutaneous QHS  . insulin detemir  14 Units  Subcutaneous BID  . pantoprazole  40 mg Oral Daily  . polyethylene glycol  17 g Oral Daily  . senna-docusate  2 tablet Oral QHS  . sildenafil  20 mg Oral TID  . sodium chloride flush  10-40 mL Intracatheter Q12H  . torsemide  40 mg Oral Daily  . traZODone  50 mg Oral QHS  . Warfarin - Pharmacist Dosing Inpatient   Does not apply q1800    Infusions: . sodium chloride 5 mL/hr at 09/15/17 0300  . lactated ringers Stopped (09/06/17 1623)    PRN Medications: ALPRAZolam, MUSCLE RUB, ondansetron (ZOFRAN) IV, oxyCODONE, sodium chloride, sodium chloride flush, traMADol    Patient Profile   Jonathan Wood is 47 year old with a history of NICM, HTN, ETOH abuse, smoker, asthma, and chronic systolic heart failure.   Assessment/Plan   1. Cardiogenic Shock: NICM had LHC 2018 with normal cors. ECHO 08/26/2017 EF 5-10%. RV moderately reduced.  s/p HM 3 LVAD 09/06/17.  Today co-ox 63% with CVP 7-9.  MAP running lower in upper 60s.   LDH 245. INR down to 1.7. Driveline site stable.  - Stop milrinone today.   - Volume status stable on torsemide 40 mg daily.  - Continue sildenafil 20 mg tid for RV failure.  - Stop Entresto with low MAP, follow and can restart low dose losartan if BP control needed.  - LDH 243 - Will need to dose coumadin more aggressively with INR 1.7, discuss with pharmacy.  - Smoker- Not a candidate for transplant. Needs 6 months off nicotine. Blood Type B+.   - Continue neurontin for pocket pain.  2. Leukocytosis: WBC 23.1 -> 20.0 -> 15.9 -> 17. Afebrile.  - Has completed Zosyn course.  - Encouraged IS.  3. Post op anemia: 4/27 1u RBCs. Hgb stable at 9.2 today.  4. Shock Liver - Resolved. 5. AKI  - Resolved.   6. Hypokalemia: Stable. K 3.7 this am.    7.  Tobacco Abuse: Encouraged complete cessation on discharge.  - Needs 6 months off for transplant consideration.  - No change to current plan.   8. Gout: Uric Acid 14.5.  - Completed 3 days of steroids. Now on  allopurinol - Stable 9. Anxiety - Improved on Celexa and Xanax.  - No change to current plan.    I reviewed the LVAD parameters from today, and compared the results to the patient's prior recorded data.  No programming changes were made.  The LVAD is functioning within specified parameters.  The patient performs LVAD self-test daily.  LVAD interrogation was negative for any significant power  changes, alarms or PI events/speed drops.  LVAD equipment check completed and is in good working order.  Back-up equipment present.   LVAD education done on emergency procedures and precautions and reviewed exit site care.   Length of Stay: 20  Marca Ancona, MD  7:32 AM   Advanced Heart Failure Team Pager [phone] (M-F; 7a - 4p)  Please contact [facility] for night-coverage after hours (4p -7a ) and weekends on [email or web address]

## 2017-09-17 NOTE — Progress Notes (Signed)
ANTICOAGULATION CONSULT NOTE - Follow-Up Consult  Pharmacy Consult for warfarin Indication: LVAD  No Known Allergies  Patient Measurements: Height: 6\' 2"  (188 cm) Weight: 176 lb 6.4 oz (80 kg) IBW/kg (Calculated) : 82.2  Vital Signs: Temp: 98.7 F (37.1 C) (05/04 0346) Temp Source: Oral (05/04 0346) BP: 55/46 (05/04 0346) Pulse Rate: 117 (05/04 0400)  Labs: Recent Labs    09/15/17 0430 09/16/17 0500 09/16/17 0702 09/17/17 0435  HGB 9.1* 9.2*  --  9.2*  HCT 29.1* 29.0*  --  28.3*  PLT 431* 418*  --  410*  LABPROT 29.4* 21.1* 20.9* 19.5*  INR 2.81 1.84 1.82 1.66  CREATININE 0.95 1.04  --  0.88    Estimated Creatinine Clearance: 121.2 mL/min (by C-G formula based on SCr of 0.88 mg/dL).   Medical History: Past Medical History:  Diagnosis Date  . Asthma   . CHF (congestive heart failure) (HCC)    a. 09/2016: EF 20-25% with cath showing normal cors  . GERD (gastroesophageal reflux disease)   . History of hiatal hernia     Assessment: 77 yoM s/p HM3 on 4/23 on warfarin per pharmacy. INR subtherapeutic at 1.66 s/p reduced doses x2 in setting of elevated INR and need to pull EP wires. Per MD, no need for heparin bridge unless INR is < 1.6. Hopefully INR will rise tomorrow after larger doses yesterday and tonight.  Goal of Therapy:  INR 2-2.5 Monitor platelets by anticoagulation protocol: Yes   Plan:  -Warfarin 6 mg PO x1 tonight -Daily INR  Tad Moore, BCPS  Clinical Pharmacist Pager [phone]  09/17/2017 7:46 AM

## 2017-09-18 ENCOUNTER — Inpatient Hospital Stay (HOSPITAL_COMMUNITY): Payer: Medicaid Other

## 2017-09-18 LAB — GLUCOSE, CAPILLARY
GLUCOSE-CAPILLARY: 85 mg/dL (ref 65–99)
Glucose-Capillary: 78 mg/dL (ref 65–99)
Glucose-Capillary: 77 mg/dL (ref 65–99)
Glucose-Capillary: 139 mg/dL — ABNORMAL HIGH (ref 65–99)

## 2017-09-18 LAB — CBC
HCT: 28.3 % — ABNORMAL LOW (ref 39.0–52.0)
HCT: 27.9 % — ABNORMAL LOW (ref 39.0–52.0)
Hemoglobin: 8.8 g/dL — ABNORMAL LOW (ref 13.0–17.0)
Hemoglobin: 8.6 g/dL — ABNORMAL LOW (ref 13.0–17.0)
MCH: 27.6 pg (ref 26.0–34.0)
MCH: 27.5 pg (ref 26.0–34.0)
MCHC: 31.1 g/dL (ref 30.0–36.0)
MCHC: 30.8 g/dL (ref 30.0–36.0)
MCV: 88.7 fL (ref 78.0–100.0)
MCV: 89.1 fL (ref 78.0–100.0)
PLATELETS: 289 10*3/uL (ref 150–400)
Platelets: 373 10*3/uL (ref 150–400)
RBC: 3.19 MIL/uL — ABNORMAL LOW (ref 4.22–5.81)
RBC: 3.13 MIL/uL — ABNORMAL LOW (ref 4.22–5.81)
RDW: 15.6 % — ABNORMAL HIGH (ref 11.5–15.5)
RDW: 15.5 % (ref 11.5–15.5)
WBC: 15.8 10*3/uL — ABNORMAL HIGH (ref 4.0–10.5)
WBC: 14 10*3/uL — AB (ref 4.0–10.5)

## 2017-09-18 LAB — BASIC METABOLIC PANEL
Anion gap: 8 (ref 5–15)
BUN: 22 mg/dL — ABNORMAL HIGH (ref 6–20)
CO2: 31 mmol/L (ref 22–32)
Calcium: 8.8 mg/dL — ABNORMAL LOW (ref 8.9–10.3)
Chloride: 90 mmol/L — ABNORMAL LOW (ref 101–111)
Creatinine, Ser: 0.81 mg/dL (ref 0.61–1.24)
GFR calc Af Amer: >60 mL/min (ref >60)
GFR calc non Af Amer: >60 mL/min (ref >60)
Glucose, Bld: 73 mg/dL (ref 65–99)
Potassium: 4 mmol/L (ref 3.5–5.1)
Sodium: 129 mmol/L — ABNORMAL LOW (ref 135–145)

## 2017-09-18 LAB — COOXEMETRY PANEL
Carboxyhemoglobin: 2.3 % — ABNORMAL HIGH (ref 0.5–1.5)
Methemoglobin: 0.7 % (ref 0.0–1.5)
O2 Saturation: 62.5 %
Total hemoglobin: 8.9 g/dL — ABNORMAL LOW (ref 12.0–16.0)

## 2017-09-18 LAB — CULTURE, BLOOD (ROUTINE X 2)
Culture: NO GROWTH
Culture: NO GROWTH
Special Requests: ADEQUATE
Special Requests: ADEQUATE

## 2017-09-18 LAB — LACTATE DEHYDROGENASE: LDH: 239 U/L — ABNORMAL HIGH (ref 98–192)

## 2017-09-18 LAB — PROTIME-INR
INR: 1.6
Prothrombin Time: 18.9 seconds — ABNORMAL HIGH (ref 11.4–15.2)

## 2017-09-18 LAB — HEPARIN LEVEL (UNFRACTIONATED): Heparin Unfractionated: 0.11 IU/mL — ABNORMAL LOW (ref 0.30–0.70)

## 2017-09-18 MED ORDER — FUROSEMIDE 10 MG/ML IJ SOLN
40.0000 mg | Freq: Once | INTRAMUSCULAR | Status: AC
  Administered 2017-09-18: 40 mg via INTRAVENOUS
  Filled 2017-09-18: qty 4

## 2017-09-18 MED ORDER — HEPARIN (PORCINE) IN NACL 100-0.45 UNIT/ML-% IJ SOLN
1350.0000 [IU]/h | INTRAMUSCULAR | Status: DC
  Administered 2017-09-18: 1100 [IU]/h via INTRAVENOUS
  Administered 2017-09-19: 1350 [IU]/h via INTRAVENOUS
  Filled 2017-09-18 (×2): qty 250

## 2017-09-18 MED ORDER — WARFARIN SODIUM 7.5 MG PO TABS
7.5000 mg | ORAL_TABLET | Freq: Once | ORAL | Status: AC
  Administered 2017-09-18: 7.5 mg via ORAL
  Filled 2017-09-18: qty 1

## 2017-09-18 MED ORDER — TORSEMIDE 20 MG PO TABS
60.0000 mg | ORAL_TABLET | Freq: Every day | ORAL | Status: DC
  Administered 2017-09-18 – 2017-09-19 (×2): 60 mg via ORAL
  Filled 2017-09-18 (×2): qty 3

## 2017-09-18 NOTE — Progress Notes (Signed)
Attempted to ambulate patient.  Patient has been in bedside recliner since approx 0900 this AM without difficulty.  Patient sat up in recliner and had an immediate, sharp, stabbing pain radiating from his left lower back area down to the front of his left leg to the knee area.  Patient attempted to ambulate anyway, stating he really wants to go home; however, he had to stop at approx. 76ft due to 9/10 pain.  Patient was placed in a wheelchair and returned to bed.  He states the pain is not resolving.  Dr. Shirlee Latch paged. Await response.

## 2017-09-18 NOTE — Progress Notes (Signed)
ANTICOAGULATION CONSULT NOTE - Follow-Up Consult  Pharmacy Consult for warfarin, add heparin for INR 1.6 Indication: LVAD  No Known Allergies  Patient Measurements: Height: 6\' 2"  (188 cm) Weight: 176 lb 6.4 oz (80 kg) IBW/kg (Calculated) : 82.2  Vital Signs: Temp: 98.4 F (36.9 C) (05/05 0720) Temp Source: Oral (05/05 0720) BP: 79/54 (05/05 0720) Pulse Rate: 90 (05/05 0720)  Labs: Recent Labs    09/16/17 0500 09/16/17 0702 09/17/17 0435 09/18/17 0500  HGB 9.2*  --  9.2* 8.8*  HCT 29.0*  --  28.3* 28.3*  PLT 418*  --  410* 373  LABPROT 21.1* 20.9* 19.5* 18.9*  INR 1.84 1.82 1.66 1.60  CREATININE 1.04  --  0.88  --     Estimated Creatinine Clearance: 121.2 mL/min (by C-G formula based on SCr of 0.88 mg/dL).   Medical History: Past Medical History:  Diagnosis Date  . Asthma   . CHF (congestive heart failure) (HCC)    a. 09/2016: EF 20-25% with cath showing normal cors  . GERD (gastroesophageal reflux disease)   . History of hiatal hernia     Assessment: 60 yoM s/p HM3 on 4/23 on warfarin per pharmacy. INR subtherapeutic at 1.60 s/p reduced doses x2 in setting of elevated INR and need to pull EP wires. Per MD, will need heparin bridge until INR back over 1.6. Hopefully INR will rise tomorrow after larger doses the past few days.  Goal of Therapy:  INR 2-2.5 Monitor platelets by anticoagulation protocol: Yes   Plan:  -Warfarin 7.5 mg PO x1 tonight -Daily INR -Initiate heparin at 1100 units/hr. -Check heparin level in 6 hrs -Daily heparin level and CBC.  Reece Leader, Colon Flattery, [facility] Clinical Pharmacist Pager [phone]  09/18/2017 9:59 AM

## 2017-09-18 NOTE — Progress Notes (Signed)
Patient now states pain is gone.  He is lying in bed, supine.  Labs drawn per PICC; new orders received from Dr. Shirlee Latch for labs and IV Lasix.  Will continue to monitor pain levels in back/LLE.  2H RN is at bedside for dressing change at this time.

## 2017-09-18 NOTE — Progress Notes (Signed)
Patient ID: Jonathan Wood, male   DOB: [birth year], 47 y.o.   MRN: [id number]     Advanced Heart Failure/VAD Rounding Note  PCP-Cardiologist: No primary care provider on file.   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   Extubated 09/07/17.  Ramp echo on 5/3, speed increased to 5600.   MAP better in 70s since Entresto stopped.   Coox 63% off milrinone.  CVP 8 this morning.    He is still noting significant dyspnea during his walks.   Pending INR, BMET, LDH.   LVAD Interrogation HM 3: Speed: 5600 Flow: 5.4 PI: 2.8 Power: 4.3. 0 PI events  Objective:   Weight Range: 176 lb 6.4 oz (80 kg) Body mass index is 22.65 kg/m.   Vital Signs:   Temp:  [97.7 F (36.5 C)-98.4 F (36.9 C)] 98.4 F (36.9 C) (05/05 0720) Pulse Rate:  [88-95] 90 (05/05 0720) Resp:  [14-47] 14 (05/05 0720) BP: (79-116)/(34-101) 79/54 (05/05 0720) SpO2:  [97 %-100 %] 100 % (05/05 0720) Last BM Date: 09/14/17  Weight change: Filed Weights   09/15/17 0602 09/16/17 0700 09/17/17 0346  Weight: 177 lb 11.1 oz (80.6 kg) 175 lb 4.3 oz (79.5 kg) 176 lb 6.4 oz (80 kg)    Intake/Output:   Intake/Output Summary (Last 24 hours) at 09/18/2017 0742 Last data filed at 09/18/2017 0700 Gross per 24 hour  Intake 1220 ml  Output 1500 ml  Net -280 ml     Physical Exam   GENERAL: Well appearing this am. NAD.  HEENT: Normal. NECK: Supple, JVP 9-10 cm. Carotids OK.  CARDIAC:  Mechanical heart sounds with LVAD hum present.  LUNGS:  CTAB, normal effort.  ABDOMEN:  NT, ND, no HSM. No bruits or masses. +BS  LVAD exit site: Well-healed and incorporated. Dressing dry and intact. No erythema or drainage. Stabilization device present and accurately applied. Driveline dressing changed daily per sterile technique. EXTREMITIES:  Warm and dry. No cyanosis, clubbing, rash, or edema.  NEUROLOGIC:  Alert & oriented x 3. Cranial nerves grossly intact. Moves all 4 extremities w/o difficulty. Affect pleasant     Telemetry    NSR 90s-100s, personally reviewed  EKG    No new tracings.    Labs    CBC Recent Labs    09/16/17 0500 09/17/17 0435  WBC 15.9* 17.1*  HGB 9.2* 9.2*  HCT 29.0* 28.3*  MCV 88.4 89.3  PLT 418* 410*   Basic Metabolic Panel Recent Labs    40/98/11 0500 09/17/17 0435  NA 129* 129*  K 3.9 3.7  CL 90* 91*  CO2 31 30  GLUCOSE 198* 131*  BUN 26* 23*  CREATININE 1.04 0.88  CALCIUM 8.8* 8.6*   Liver Function Tests Recent Labs    09/16/17 0500  AST 43*  ALT 29  ALKPHOS 91  BILITOT 1.0  PROT 7.7  ALBUMIN 2.5*   No results for input(s): LIPASE, AMYLASE in the last 72 hours. Cardiac Enzymes No results for input(s): CKTOTAL, CKMB, CKMBINDEX, TROPONINI in the last 72 hours.  BNP: BNP (last 3 results) Recent Labs    08/25/17 1337 09/07/17 0411 09/13/17 0348  BNP 2,510.0* 272.7* 233.6*    ProBNP (last 3 results) No results for input(s): PROBNP in the last 8760 hours.   D-Dimer No results for input(s): DDIMER in the last 72 hours. Hemoglobin A1C No results for input(s): HGBA1C in the last 72 hours. Fasting Lipid Panel No results for input(s): CHOL, HDL, LDLCALC, TRIG, CHOLHDL, LDLDIRECT in  the last 72 hours. Thyroid Function Tests No results for input(s): TSH, T4TOTAL, T3FREE, THYROIDAB in the last 72 hours.  Invalid input(s): FREET3  Other results:   Imaging    No results found.   Medications:     Scheduled Medications: . amiodarone  200 mg Oral BID  . aspirin EC  81 mg Oral Daily  . Chlorhexidine Gluconate Cloth  6 each Topical Daily  . citalopram  10 mg Oral Daily  . feeding supplement (ENSURE ENLIVE)  237 mL Oral BID BM  . gabapentin  100 mg Oral TID  . insulin aspart  0-15 Units Subcutaneous TID WC  . insulin aspart  0-5 Units Subcutaneous QHS  . insulin detemir  14 Units Subcutaneous BID  . pantoprazole  40 mg Oral Daily  . polyethylene glycol  17 g Oral Daily  . senna-docusate  2 tablet Oral QHS  . sildenafil  20 mg Oral TID  .  sodium chloride flush  10-40 mL Intracatheter Q12H  . torsemide  60 mg Oral Daily  . traZODone  50 mg Oral QHS  . Warfarin - Pharmacist Dosing Inpatient   Does not apply q1800    Infusions: . sodium chloride 5 mL/hr at 09/15/17 0300  . lactated ringers Stopped (09/06/17 1623)    PRN Medications: ALPRAZolam, MUSCLE RUB, ondansetron (ZOFRAN) IV, oxyCODONE, sodium chloride, sodium chloride flush, traMADol    Patient Profile   Jonathan Wood is 47 year old with a history of NICM, HTN, ETOH abuse, smoker, asthma, and chronic systolic heart failure.   Assessment/Plan   1. Cardiogenic Shock: NICM had LHC 2018 with normal cors. ECHO 08/26/2017 EF 5-10%. RV moderately reduced.  s/p HM 3 LVAD 09/06/17.  Today co-ox 63% off milrinone with CVP 8.  MAP better in 70s off Entresto.   LDH and INR pending. Driveline site stable.  Still dyspnea with ambulation.  - CVP only 8 but stopped Entresto yesterday and JVP suggests a bit more fluid than CVP suggests.  He is also still dyspneic with walking.  Will increase torsemide to 60 mg daily.  Will also get CXR.   - Continue sildenafil 20 mg tid for RV failure.  - Dose warfarin based on this morning's INR, can bridge with heparin if < 1.6.   - Smoker- Not a candidate for transplant. Needs 6 months off nicotine. Blood Type B+.   - Continue neurontin for pocket pain.  2. Leukocytosis: WBC 23.1 -> 20.0 -> 15.9 -> 17 -> pending. Afebrile.  - Has completed Zosyn course.  - Encouraged IS.  3. Post op anemia: 4/27 1u RBCs. Hgb fairly stable at 8.9 from co-ox today.  4. Shock Liver - Resolved. 5. AKI  - Resolved.   6. Hypokalemia: Pending BMET.     7.  Tobacco Abuse: Encouraged complete cessation on discharge.  - Needs 6 months off for transplant consideration.  - No change to current plan.   8. Gout: Uric Acid 14.5.  - Completed 3 days of steroids. Now on allopurinol - Stable 9. Anxiety - Improved on Celexa and Xanax.  - No change to current plan.    I  reviewed the LVAD parameters from today, and compared the results to the patient's prior recorded data.  No programming changes were made.  The LVAD is functioning within specified parameters.  The patient performs LVAD self-test daily.  LVAD interrogation was negative for any significant power changes, alarms or PI events/speed drops.  LVAD equipment check completed and is in  good working order.  Back-up equipment present.   LVAD education done on emergency procedures and precautions and reviewed exit site care.   Length of Stay: 24  Marca Ancona, MD  7:42 AM   Advanced Heart Failure Team Pager [phone] (M-F; 7a - 4p)  Please contact [facility] for night-coverage after hours (4p -7a ) and weekends on [email or web address]

## 2017-09-18 NOTE — Progress Notes (Signed)
ANTICOAGULATION CONSULT NOTE - Follow-Up Consult  Pharmacy Consult for warfarin, add heparin for INR 1.6 Indication: LVAD  No Known Allergies  Patient Measurements: Height: 6\' 2"  (188 cm) Weight: 176 lb 6.4 oz (80 kg) IBW/kg (Calculated) : 82.2  Vital Signs: Temp: 97.8 F (36.6 C) (05/05 1550) Temp Source: Oral (05/05 1550) BP: 87/74 (05/05 1648) Pulse Rate: 91 (05/05 1550)  Labs: Recent Labs    09/16/17 0500 09/16/17 0702 09/17/17 0435 09/18/17 0500 09/18/17 1657 09/18/17 1700  HGB 9.2*  --  9.2* 8.8* 8.6*  --   HCT 29.0*  --  28.3* 28.3* 27.9*  --   PLT 418*  --  410* 373 289  --   LABPROT 21.1* 20.9* 19.5* 18.9*  --   --   INR 1.84 1.82 1.66 1.60  --   --   HEPARINUNFRC  --   --   --   --   --  0.11*  CREATININE 1.04  --  0.88 0.81  --   --     Estimated Creatinine Clearance: 131.7 mL/min (by C-G formula based on SCr of 0.81 mg/dL).   Medical History: Past Medical History:  Diagnosis Date  . Asthma   . CHF (congestive heart failure) (HCC)    a. 09/2016: EF 20-25% with cath showing normal cors  . GERD (gastroesophageal reflux disease)   . History of hiatal hernia     Assessment: 23 yoM s/p HM3 on 4/23 on warfarin per pharmacy. INR subtherapeutic at 1.60 s/p reduced doses x2 in setting of elevated INR and need to pull EP wires. Per MD, will need heparin bridge until INR back over 1.6.  -initial heparin level = 0.11  Goal of Therapy:  INR 2-2.5 Monitor platelets by anticoagulation protocol: Yes   Plan:  -Increase heparin to 1350 units/hr -Check heparin level in 6 hrs -Daily heparin level and CBC.  Harland German, PharmD Clinical Pharmacist 09/18/2017 5:51 PM

## 2017-09-18 NOTE — Progress Notes (Signed)
Left abdominalVAD dressing removed and site care performed using sterile technique. Drive line exit site cleaned with Chlora prep applicators x 2, allowed to dry, andgauzedressing with Aquacel silver stripre-applied. Exit site unincorporated,two sutures intact,the velour is fully implanted at exit site.Small amount serosanguinous drainage around site and on old gauze dressing.   No redness, tenderness, foul odor or rash noted. Upon initiating dressing change it was noted that the drive line was not in the anchor.  I asked the patient and nurse if it had been like this all day and both stated earlier in the day the drive line was secure in the anchor.  Re-educated patient about the significance of maintaining the drive line in the anchor and possible complications that could result if the anchor is overlooked.  Patient verbalized his understanding.  Drive line reinserted in the anchor and no evidence of migration present.  Tommi Emery

## 2017-09-19 LAB — GLUCOSE, CAPILLARY
GLUCOSE-CAPILLARY: 83 mg/dL (ref 65–99)
GLUCOSE-CAPILLARY: 126 mg/dL — AB (ref 65–99)

## 2017-09-19 LAB — COOXEMETRY PANEL
Carboxyhemoglobin: 2.4 % — ABNORMAL HIGH (ref 0.5–1.5)
Methemoglobin: 0.9 % (ref 0.0–1.5)
O2 Saturation: 60.9 %
Total hemoglobin: 8.7 g/dL — ABNORMAL LOW (ref 12.0–16.0)

## 2017-09-19 LAB — PROTIME-INR
INR: 1.77
PROTHROMBIN TIME: 20.4 s — AB (ref 11.4–15.2)

## 2017-09-19 LAB — BASIC METABOLIC PANEL
Anion gap: 10 (ref 5–15)
BUN: 23 mg/dL — ABNORMAL HIGH (ref 6–20)
CO2: 31 mmol/L (ref 22–32)
Calcium: 8.7 mg/dL — ABNORMAL LOW (ref 8.9–10.3)
Chloride: 91 mmol/L — ABNORMAL LOW (ref 101–111)
Creatinine, Ser: 0.96 mg/dL (ref 0.61–1.24)
GFR calc Af Amer: >60 mL/min (ref >60)
GFR calc non Af Amer: >60 mL/min (ref >60)
Glucose, Bld: 85 mg/dL (ref 65–99)
Potassium: 3.7 mmol/L (ref 3.5–5.1)
Sodium: 132 mmol/L — ABNORMAL LOW (ref 135–145)

## 2017-09-19 LAB — CBC
HCT: 21.1 % — ABNORMAL LOW (ref 39.0–52.0)
Hemoglobin: 6.6 g/dL — CL (ref 13.0–17.0)
MCH: 27.6 pg (ref 26.0–34.0)
MCHC: 31.3 g/dL (ref 30.0–36.0)
MCV: 88.3 fL (ref 78.0–100.0)
Platelets: 298 10*3/uL (ref 150–400)
RBC: 2.39 MIL/uL — ABNORMAL LOW (ref 4.22–5.81)
RDW: 15.4 % (ref 11.5–15.5)
WBC: 15.2 10*3/uL — ABNORMAL HIGH (ref 4.0–10.5)

## 2017-09-19 LAB — HEPARIN LEVEL (UNFRACTIONATED): HEPARIN UNFRACTIONATED: 0.35 [IU]/mL (ref 0.30–0.70)

## 2017-09-19 LAB — HEMOGLOBIN AND HEMATOCRIT, BLOOD
HCT: 27.7 % — ABNORMAL LOW (ref 39.0–52.0)
Hemoglobin: 8.6 g/dL — ABNORMAL LOW (ref 13.0–17.0)

## 2017-09-19 LAB — LACTATE DEHYDROGENASE: LDH: 227 U/L — ABNORMAL HIGH (ref 98–192)

## 2017-09-19 MED ORDER — PANTOPRAZOLE SODIUM 40 MG PO TBEC: 40.0000 mg | DELAYED_RELEASE_TABLET | Freq: Every day | ORAL | 6 refills | Status: DC

## 2017-09-19 MED ORDER — WARFARIN SODIUM 5 MG PO TABS: 5.0000 mg | ORAL_TABLET | Freq: Once | ORAL | Status: DC

## 2017-09-19 MED ORDER — WARFARIN SODIUM 5 MG PO TABS: 5.0000 mg | ORAL_TABLET | Freq: Once | ORAL | 3 refills | Status: DC

## 2017-09-19 MED ORDER — CITALOPRAM HYDROBROMIDE 10 MG PO TABS: 10.0000 mg | ORAL_TABLET | Freq: Every day | ORAL | 3 refills | Status: DC

## 2017-09-19 MED ORDER — TRAMADOL HCL 50 MG PO TABS: 50.0000 mg | ORAL_TABLET | ORAL | 0 refills | Status: DC | PRN

## 2017-09-19 MED ORDER — OXYCODONE HCL 5 MG PO TABS: 5.0000 mg | ORAL_TABLET | Freq: Four times a day (QID) | ORAL | 0 refills | Status: DC | PRN

## 2017-09-19 MED ORDER — GABAPENTIN 100 MG PO CAPS: 100.0000 mg | ORAL_CAPSULE | Freq: Three times a day (TID) | ORAL | 6 refills | Status: DC

## 2017-09-19 MED ORDER — SILDENAFIL CITRATE 20 MG PO TABS: 20.0000 mg | ORAL_TABLET | Freq: Three times a day (TID) | ORAL | 3 refills | Status: DC

## 2017-09-19 MED ORDER — POLYETHYLENE GLYCOL 3350 17 G PO PACK
17.0000 g | PACK | Freq: Every day | ORAL | 0 refills | Status: DC
Start: 2017-09-20 — End: 2017-10-07

## 2017-09-19 MED ORDER — TORSEMIDE 20 MG PO TABS: 60.0000 mg | ORAL_TABLET | Freq: Every day | ORAL | 3 refills | Status: DC

## 2017-09-19 MED ORDER — AMIODARONE HCL 200 MG PO TABS: ORAL_TABLET | ORAL | 3 refills | Status: DC

## 2017-09-19 MED ORDER — ENSURE ENLIVE PO LIQD: 237.0000 mL | Freq: Two times a day (BID) | ORAL | 12 refills | Status: DC

## 2017-09-19 MED FILL — GABAPENTIN 100 MG CAP: 100 | 30 days supply | Qty: 90 | Fill #0 | Status: TO

## 2017-09-19 MED FILL — TORSEMIDE 20 MG TABS: 20 | 30 days supply | Qty: 90 | Fill #0

## 2017-09-19 MED FILL — CITALOPRAM HBR 10 MG TABLET: 10 | 30 days supply | Qty: 30 | Fill #0 | Status: TO

## 2017-09-19 MED FILL — SM CLEARLAX POWDER: 14 days supply | Qty: 238 | Fill #0

## 2017-09-19 MED FILL — WARFARIN SODIUM 5 MG TABLET: 5 | 30 days supply | Qty: 30 | Fill #0 | Status: TO

## 2017-09-19 MED FILL — SILDENAFIL 20 MG TABLET: 20 | 30 days supply | Qty: 90 | Fill #0 | Status: TO

## 2017-09-19 MED FILL — AMIODARONE HCL 200 MG TAB: 200 | 31 days supply | Qty: 36 | Fill #0 | Status: TO

## 2017-09-19 MED FILL — PANTOPRAZOLE SOD DR 40 MG T: 40 | 30 days supply | Qty: 30 | Fill #0 | Status: TO

## 2017-09-19 NOTE — Progress Notes (Signed)
OT Cancellation Note  Patient Details Name: Jonathan Wood MRN: [id number] DOB: 04-Jun-1970   Cancelled Treatment:    Reason Eval/Treat Not Completed: Medical issues which prohibited therapy (pt currently with Hgb 6.6)    Evette Georges [phone] 09/19/2017, 7:47 AM

## 2017-09-19 NOTE — Progress Notes (Signed)
PT Cancellation Note  Patient Details Name: Jonathan Wood MRN: [id number] DOB: 1971/03/08   Cancelled Treatment:    Reason Eval/Treat Not Completed: Medical issues which prohibited therapy(pt currently with Hgb 6.6)   [name] [name] 09/19/2017, 7:11 AM  [name], PT [phone]

## 2017-09-19 NOTE — Progress Notes (Addendum)
ANTICOAGULATION CONSULT NOTE - Follow-Up Consult  Pharmacy Consult for warfarin Indication: LVAD  No Known Allergies  Patient Measurements: Height: 6\' 2"  (188 cm) Weight: 182 lb 1.6 oz (82.6 kg) IBW/kg (Calculated) : 82.2  Vital Signs: Temp: 98.5 F (36.9 C) (05/06 0400) Temp Source: Oral (05/06 0400) BP: 84/40 (05/06 0811) Pulse Rate: 93 (05/06 0400)  Labs: Recent Labs    09/17/17 0435 09/18/17 0500 09/18/17 1657 09/18/17 1700 09/19/17 0445 09/19/17 0446 09/19/17 0635  HGB 9.2* 8.8* 8.6*  --  6.6*  --  8.6*  HCT 28.3* 28.3* 27.9*  --  21.1*  --  27.7*  PLT 410* 373 289  --  298  --   --   LABPROT 19.5* 18.9*  --   --   --  20.4*  --   INR 1.66 1.60  --   --   --  1.77  --   HEPARINUNFRC  --   --   --  0.11*  --  0.35  --   CREATININE 0.88 0.81  --   --  0.96  --   --     Estimated Creatinine Clearance: 114.2 mL/min (by C-G formula based on SCr of 0.96 mg/dL).   Medical History: Past Medical History:  Diagnosis Date  . Asthma   . CHF (congestive heart failure) (HCC)    a. 09/2016: EF 20-25% with cath showing normal cors  . GERD (gastroesophageal reflux disease)   . History of hiatal hernia     Assessment: 49 yoM s/p HM3 on 4/23 on warfarin per pharmacy. INR subtherapeutic but trending up s/p boosted dosing x3. Heparin bridge stopped this morning, CBC stable on repeat.  Goal of Therapy:  INR 2-2.5 Monitor platelets by anticoagulation protocol: Yes  Plan:  -Warfarin 5mg  PO x1 tonight -Daily INR  ADDENDUM: Would recommend continuing 5mg  daily at discharge with close INR follow-up in HF clinic Thursday or Friday.   Fredonia Highland, PharmD, BCPS PGY-2 Cardiology Pharmacy Resident Pager: [phone] 09/19/2017

## 2017-09-19 NOTE — Progress Notes (Addendum)
Patient ID: Jonathan Wood, male   DOB: 1971-01-08, 47 y.o.   MRN: [id number]     Advanced Heart Failure/VAD Rounding Note  PCP-Cardiologist: No primary care provider on file.   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   Extubated 09/07/17.  Ramp echo on 5/3, speed increased to 5600.   MAP 60-70s since Entresto stopped. Doppler 82 this am, but ? If modified systolic.   Coox 60.9% off milrinone. CVP 10-11   Awake and alert. Feeling good overall. Had some back pain with walking yesterday, but improved significantly after very large bowel movement. Walking halls today without problem. Diuretics increased yesterday due to exertional dyspnea.   INR 1.77 this am.  LDH 227.   LVAD Interrogation HM 3: Speed: 5600 Flow: 5.3 PI: 2.6 Power: 4.0. No PI events   Objective:   Weight Range: 182 lb 1.6 oz (82.6 kg) Body mass index is 23.38 kg/m.   Vital Signs:   Temp:  [97.7 F (36.5 C)-98.5 F (36.9 C)] 98.5 F (36.9 C) (05/06 0400) Pulse Rate:  [89-93] 93 (05/06 0400) Resp:  [19-25] 21 (05/06 0400) BP: (73-98)/(40-79) 84/40 (05/06 0811) SpO2:  [99 %-100 %] 99 % (05/06 0400) Weight:  [177 lb 7.5 oz (80.5 kg)-182 lb 1.6 oz (82.6 kg)] 182 lb 1.6 oz (82.6 kg) (05/06 0500) Last BM Date: 09/14/17  Weight change: Filed Weights   09/17/17 0346 09/18/17 1110 09/19/17 0500  Weight: 176 lb 6.4 oz (80 kg) 177 lb 7.5 oz (80.5 kg) 182 lb 1.6 oz (82.6 kg)   Intake/Output:   Intake/Output Summary (Last 24 hours) at 09/19/2017 0851 Last data filed at 09/19/2017 0425 Gross per 24 hour  Intake 951.78 ml  Output 1075 ml  Net -123.22 ml    Physical Exam   GENERAL: Well appearing this am. NAD.  HEENT: Normal.aniucteric  NECK: Supple, JVP 7-8 cm. Carotids OK.  CARDIAC:  Mechanical heart sounds with LVAD hum present.  LUNGS:  CTAB, normal effort. no wheeze  ABDOMEN:  NT, ND, no HSM. No bruits or masses. +BS  LVAD exit site: Dressing dry and intact. No erythema or drainage. Stabilization device  present and accurately applied. Driveline dressing changed daily per sterile technique. Extremities: no cyanosis, clubbing, rash, edema Neuro: alert & oriented x 3, cranial nerves grossly intact. moves all 4 extremities w/o difficulty. Affect pleasant  Telemetry   NSR 90-100s, personally reviewed.   EKG    No new tracings.    Labs    CBC Recent Labs    09/18/17 1657 09/19/17 0445 09/19/17 0635  WBC 14.0* 15.2*  --   HGB 8.6* 6.6* 8.6*  HCT 27.9* 21.1* 27.7*  MCV 89.1 88.3  --   PLT 289 298  --    Basic Metabolic Panel Recent Labs    69/67/89 0500 09/19/17 0445  NA 129* 132*  K 4.0 3.7  CL 90* 91*  CO2 31 31  GLUCOSE 73 85  BUN 22* 23*  CREATININE 0.81 0.96  CALCIUM 8.8* 8.7*   Liver Function Tests No results for input(s): AST, ALT, ALKPHOS, BILITOT, PROT, ALBUMIN in the last 72 hours. No results for input(s): LIPASE, AMYLASE in the last 72 hours. Cardiac Enzymes No results for input(s): CKTOTAL, CKMB, CKMBINDEX, TROPONINI in the last 72 hours.  BNP: BNP (last 3 results) Recent Labs    08/25/17 1337 09/07/17 0411 09/13/17 0348  BNP 2,510.0* 272.7* 233.6*    ProBNP (last 3 results) No results for input(s): PROBNP in the  last 8760 hours.   D-Dimer No results for input(s): DDIMER in the last 72 hours. Hemoglobin A1C No results for input(s): HGBA1C in the last 72 hours. Fasting Lipid Panel No results for input(s): CHOL, HDL, LDLCALC, TRIG, CHOLHDL, LDLDIRECT in the last 72 hours. Thyroid Function Tests No results for input(s): TSH, T4TOTAL, T3FREE, THYROIDAB in the last 72 hours.  Invalid input(s): FREET3  Other results:   Imaging    No results found.   Medications:     Scheduled Medications: . amiodarone  200 mg Oral BID  . aspirin EC  81 mg Oral Daily  . Chlorhexidine Gluconate Cloth  6 each Topical Daily  . citalopram  10 mg Oral Daily  . feeding supplement (ENSURE ENLIVE)  237 mL Oral BID BM  . gabapentin  100 mg Oral TID  .  insulin aspart  0-15 Units Subcutaneous TID WC  . insulin aspart  0-5 Units Subcutaneous QHS  . insulin detemir  14 Units Subcutaneous BID  . pantoprazole  40 mg Oral Daily  . polyethylene glycol  17 g Oral Daily  . senna-docusate  2 tablet Oral QHS  . sildenafil  20 mg Oral TID  . sodium chloride flush  10-40 mL Intracatheter Q12H  . torsemide  60 mg Oral Daily  . traZODone  50 mg Oral QHS  . Warfarin - Pharmacist Dosing Inpatient   Does not apply q1800    Infusions: . sodium chloride 5 mL/hr at 09/15/17 0300  . heparin 1,350 Units/hr (09/19/17 0425)  . lactated ringers Stopped (09/06/17 1623)    PRN Medications: ALPRAZolam, MUSCLE RUB, ondansetron (ZOFRAN) IV, oxyCODONE, sodium chloride, sodium chloride flush, traMADol    Patient Profile   Ms Lundberg is 47 year old with a history of NICM, HTN, ETOH abuse, smoker, asthma, and chronic systolic heart failure.   Assessment/Plan   1. Cardiogenic Shock: NICM had LHC 2018 with normal cors. ECHO 08/26/2017 EF 5-10%. RV moderately reduced.  s/p HM 3 LVAD 09/06/17.  Today co-ox 63% off milrinone with CVP 8.  MAP better in 70s off Entresto.   LDH and INR pending. Driveline site stable.  Still dyspnea with ambulation.  - CVP ~10 cm - Continue torsemide 60 mg daily with soft pressures.  - Entresto stopped 09/17/17 with hypotension. (MAPs in 60s) - CXR 09/18/17 with trace bilateral pleural effusions.  - Continue sildenafil 20 mg tid for RV failure.  - Dose warfarin based on this morning's INR, can bridge with heparin if < 1.6.   - Smoker- Not a candidate for transplant. Needs 6 months off nicotine. Blood Type B+.   - Continue neurontin for pocket pain.  2. Leukocytosis: WBC 15.2. Afebrile. - Has completed Zosyn course.  - Encouraged IS 3. Post op anemia: 4/27 1u RBCs.  - Hgb fairly stable at 8.6 from co-ox today.  4. Shock Liver - Resolved.  5. AKI  - Resolved.  6. Hypokalemia: Pending BMET.     7.  Tobacco Abuse: Encouraged complete  cessation on discharge.  - Needs 6 months off for transplant consideration.  - No change to current plan.   8. Gout: Uric Acid 14.5.  - Completed 3 days of steroids. Now on allopurinol - Stable.  9. Anxiety - Improved on Celexa and Xanax.  - No change to current plan.    Doing well overall. MAPs 60s by autocuff but relatively asymptomatic. Only on torsemide and revatio. Will discuss dispo plan with MD.   I reviewed the LVAD parameters  from today, and compared the results to the patient's prior recorded data.  No programming changes were made.  The LVAD is functioning within specified parameters.  The patient performs LVAD self-test daily.  LVAD interrogation was negative for any significant power changes, alarms or PI events/speed drops.  LVAD equipment check completed and is in good working order.  Back-up equipment present.   LVAD education done on emergency procedures and precautions and reviewed exit site care.   Length of Stay: 25  Graciella Freer, [place]  8:51 AM   Advanced Heart Failure Team Pager [phone] (M-F; 7a - 4p)  Please contact [facility] for night-coverage after hours (4p -7a ) and weekends on [email or web address]  Overall much improved. Volume status looks good. MAPs stable. VAD interrogated personally. Parameters stable. INR 1.77. He is ready for d/c with close f/u in VAD Clinic on Friday.   Arvilla Meres, MD  8:46 PM

## 2017-09-19 NOTE — Progress Notes (Signed)
LVAD Coordinator Education Note:   Completed LVAD education Friday 5/3 by reviewing:  Emergency Situations: xDefining HMIII LVAD Emergency xSteps to take during an emergency xHow to identify power/connection problems xEmergency Telephone Contacts xEmergency Transportation Pan  Hands On Training for Patient and Support Person Competency: xMaking and Breaking connections, one at a time xSwitching power sources (MPU to Battery, Battery to [employer]) xChanging batteries, one at a time xPerforming System controller self-test (daily) xTroubleshooting advisories/alarms   Informational Materials have been provided and reviewed in entirety: xD/C Instructions Sheet xList of emergency contacts xGuide to Alerts and Alarms xPatient Handbook x 2 xGuide to Replacing System Controller xHome Log Sheets xLVAD Emergency Contact Sheets    Reinforced that at each clinic visit they will bring medications/list of medications and log book entries of parameters/weights.   Mr. Cardena and his caregivers Cristy, Victoria, [name] and [place] confirmed adequate training and answering of all questions at this time. They passed the HeartMate III Discharge Test at 100%. Practiced paging the pager with pt and all family members. They at this time feel ready for D/C home and will follow up with [place] in VAD Clinic Friday 09/19/17 @ 11am.   Total Elapsed Time: 3 hours  Carlton Adam RN, BSN VAD Coordinator 24/7 Pager [phone]

## 2017-09-19 NOTE — Progress Notes (Signed)
Called Dr. Shirlee Latch about Hemoglobin results of 6.6. He ordered to stop the Heparin gtt and ask Lab to redraw a  H&H. Lab notified and RX notified of time Heparin gtt stopped. Will monitor results. Marland Kitchen

## 2017-09-19 NOTE — Progress Notes (Addendum)
Occupational Therapy Treatment Patient Details Name: Jonathan Wood MRN: [id number] DOB: 08/10/1970 Today's Date: 09/19/2017    History of present illness The patient is a 47 year old African American male, who presented to the hospital with fatigue, edema, dizziness, and cardiogenic shock. 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 Extubated 4/24. PMHx: Asthma, CHF, GERD, and History of hiatal hernia.   OT comments  This 47 yo male admitted and underwent above presents to acute OT with all OT education completed and pt to D/C home day.We will sign off.  PF= 5.3 PS= 5600 PP=4.3 Sats=95-97% RA   Follow Up Recommendations  No OT follow up;Supervision/Assistance - 24 hour    Equipment Recommendations  3 in 1 bedside commode       Precautions / Restrictions Precautions Precautions: Sternal Precaution Comments: verbally reviewed precautions with patient, LVAD Restrictions Weight Bearing Restrictions: Yes Other Position/Activity Restrictions: sternal       Mobility Bed Mobility Overal bed mobility: Independent             General bed mobility comments: Pt sat straight up without use of arms and pivoted around to EOB without arms  Transfers Overall transfer level: Needs assistance Equipment used: None Transfers: Sit to/from Stand Sit to Stand: Supervision         General transfer comment: Need cues x1 for hand placement for 6 transitions        ADL either performed or assessed with clinical judgement   ADL Overall ADL's : Needs assistance/impaired                       Lower Body Dressing Details (indicate cue type and reason): pt can cross his legs to get to his feet without issue. We discussed that it will be easier if her puts his underwear and pants on first before his socks Toilet Transfer: Supervision/safety;Ambulation;Regular Toilet                   Vision Baseline Vision/History: Wears glasses Wears Glasses: (only to use his  phone) Patient Visual Report: No change from baseline            Cognition Arousal/Alertness: Awake/alert Behavior During Therapy: WFL for tasks assessed/performed Overall Cognitive Status: Impaired/Different from baseline Area of Impairment: Memory                               General Comments: remembered he needs to "stay in the tube" but did not remember no pushing, pulling, or lifting until I said the first one; he was able to recall the 3 items he needed in his black bag, he is aware to not sleep or nap on his batteries; he knows he needs to take sponge baths until the HF team goes over with him how to keep drive line area and controller dry for showers; the one dificulty he had with switiching from wall to battey power was seeing where they were connected and which way to turn to disconnect (showed him the unlock and lock symbols). He knew how to check power of each battery and how to put batteries in clips                   Pertinent Vitals/ Pain       Pain Assessment: No/denies pain         Frequency  Min 3X/week        Progress Toward Goals  OT Goals(current goals can now be found in the care plan section)  Progress towards OT goals: Progressing toward goals     Plan Discharge plan remains appropriate       AM-PAC PT "6 Clicks" Daily Activity     Outcome Measure   Help from another person eating meals?: None Help from another person taking care of personal grooming?: None Help from another person toileting, which includes using toliet, bedpan, or urinal?: A Little(S) Help from another person bathing (including washing, rinsing, drying)?: A Little(S) Help from another person to put on and taking off regular upper body clothing?: A Little(min A ) Help from another person to put on and taking off regular lower body clothing?: A Little(S) 6 Click Score: 20    End of Session Equipment Utilized During Treatment: Gait belt  OT Visit Diagnosis:  Unsteadiness on feet (R26.81)   Activity Tolerance Patient tolerated treatment well   Patient Left in chair;with call bell/phone within reach(with Molly in room getting him set up with his home equipment)   Nurse Communication          Time: 9030-0923 OT Time Calculation (min): 35 min  Charges: OT General Charges $OT Visit: 1 Visit OT Treatments $Self Care/Home Management : 23-37 mins  Ignacia Palma, OTR/L [phone] 09/19/2017

## 2017-09-19 NOTE — Discharge Summary (Addendum)
Advanced Heart Failure Team  Discharge Summary   Patient ID: Jonathan Wood MRN: [id number], DOB/AGE: 12-31-1970 47 y.o. Admit date: 08/25/2017 D/C date:     09/19/2017   Primary Discharge Diagnoses:  1. Acute on chronic systolic HF -> Cardiogenic Shock: NICM had LHC 2018 with normal cors. ECHO 08/26/2017 EF 5-10%. RV moderately reduced.  s/p HM 3 LVAD 09/06/17.   2. Leukocytosis 3. Post op anemia 4. Shock Liver 5. AKI stage III 6. Hypokalemia 7. Tobacco Abuse 8. Gout 9. Anxiety  Hospital Course:   Jonathan Wood is a 47 y.o. male with h/o NICM, HTN, ETOH abuse, smoker, asthma, chronic systolic HF.   Pt admitted 08/25/17 with RUQ pain and increased SOB. LFTs severely elevated on admit, BNP 2500, and troponin 0.04. Started on IV lasix and milrinone 0.125 mcg/kg/min with over 27 lbs of diuresis.   Echo showed EF 5-10%, with mild RV dilation, and mod to severe reduction in function. HF team consulted on 08/30/17 with pts critical illness.   Pt discussed at [facility] and determined to be appropriate candidate for LVAD -> HM3 for bridge to candidacy for transplant.   Pt underwent HM3 LVAD 09/06/17 without any immediate post-op complications. Pressors from OR weaned as tolerated. Extubated 10/07/17.  Po meds adjusted as tolerated. 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   Started on neurontin for pocket pain. Progressed will with PT and thought stable for home on discharge. Pt did have post op anemia requiring 1 uPRBCs 09/10/17 with appropriate rise in Hgb.  Shock Liver and AKI both improved with mechanical circulatory support.    Hospital course additionally complicated by severe anxiety. Improved on Celexa, and required very little xanax by day of discharge.   Entresto stopped 09/17/2017 due to hypotension with MAPs in 50-60s.   Examined on am of 09/19/17 and thought stable for discharge home with very close follow up as below. INR check on 09/22/17 per [facility], and will follow  up in LVAD clinic 09/23/17. With no insurance, cardiac meds will be provided through HF fund.  He will use his GoFUNDMe proceeds to pay for his other medications.   LVAD Interrogation HM 3: Speed: 5600 Flow: 5.3 PI: 2.6 Power: 4.0. No PI events on day of discharge  Discharge Weight: 182 lbs. Discharge Vitals: Blood pressure (!) 84/40, pulse 93, temperature 98.5 F (36.9 C), temperature source Oral, resp. rate (!) 21, height 6\' 2"  (1.88 m), weight 182 lb 1.6 oz (82.6 kg), SpO2 99 %.  Labs: Lab Results  Component Value Date   WBC 15.2 (H) 09/19/2017   HGB 8.6 (L) 09/19/2017   HCT 27.7 (L) 09/19/2017   MCV 88.3 09/19/2017   PLT 298 09/19/2017    Recent Labs  Lab 09/16/17 0500  09/19/17 0445  NA 129*   < > 132*  K 3.9   < > 3.7  CL 90*   < > 91*  CO2 31   < > 31  BUN 26*   < > 23*  CREATININE 1.04   < > 0.96  CALCIUM 8.8*   < > 8.7*  PROT 7.7  --   --   BILITOT 1.0  --   --   ALKPHOS 91  --   --   ALT 29  --   --   AST 43*  --   --   GLUCOSE 198*   < > 85   < > = values in this interval not  displayed.   Lab Results  Component Value Date   CHOL 111 08/30/2017   HDL 33 (L) 08/30/2017   LDLCALC 70 08/30/2017   TRIG 38 08/30/2017   BNP (last 3 results) Recent Labs    08/25/17 1337 09/07/17 0411 09/13/17 0348  BNP 2,510.0* 272.7* 233.6*    ProBNP (last 3 results) No results for input(s): PROBNP in the last 8760 hours.   Diagnostic Studies/Procedures   Dg Chest 2 View  Result Date: 09/18/2017 CLINICAL DATA:  CHF, dyspnea EXAM: CHEST - 2 VIEW COMPARISON:  09/15/2017 chest radiograph. FINDINGS: Stable configuration of sternotomy wires and left ventricular assist device. Right PICC terminates at the cavoatrial junction. Stable cardiomediastinal silhouette with moderate cardiomegaly. No pneumothorax. Trace bilateral pleural effusions, unchanged. Mild pulmonary edema. IMPRESSION: Mild congestive heart failure with trace bilateral pleural effusions. Electronically Signed    By: Delbert Phenix M.D.   On: 09/18/2017 10:27    Discharge Medications   Allergies as of 09/19/2017   No Known Allergies     Medication List    STOP taking these medications   albuterol (2.5 MG/3ML) 0.083% nebulizer solution Commonly known as:  PROVENTIL   albuterol 108 (90 Base) MCG/ACT inhaler Commonly known as:  PROVENTIL HFA;VENTOLIN HFA   carvedilol 6.25 MG tablet Commonly known as:  COREG   furosemide 80 MG tablet Commonly known as:  LASIX   sacubitril-valsartan 24-26 MG Commonly known as:  ENTRESTO   traZODone 50 MG tablet Commonly known as:  DESYREL     TAKE these medications   amiodarone 200 MG tablet Commonly known as:  PACERONE Take 200 mg BID until 09/23/17, then decrease to 200 mg daily.   aspirin EC 81 MG tablet Take 81 mg by mouth daily.   citalopram 10 MG tablet Commonly known as:  CELEXA Take 1 tablet (10 mg total) by mouth daily. Start taking on:  09/20/2017   feeding supplement (ENSURE ENLIVE) Liqd Take 237 mLs by mouth 2 (two) times daily between meals.   gabapentin 100 MG capsule Commonly known as:  NEURONTIN Take 1 capsule (100 mg total) by mouth 3 (three) times daily.   oxyCODONE 5 MG immediate release tablet Commonly known as:  Oxy IR/ROXICODONE Take 1-2 tablets (5-10 mg total) by mouth every 6 (six) hours as needed for severe pain.   pantoprazole 40 MG tablet Commonly known as:  PROTONIX Take 1 tablet (40 mg total) by mouth daily. Start taking on:  09/20/2017   polyethylene glycol packet Commonly known as:  MIRALAX / GLYCOLAX Take 17 g by mouth daily. Start taking on:  09/20/2017   sildenafil 20 MG tablet Commonly known as:  REVATIO Take 1 tablet (20 mg total) by mouth 3 (three) times daily.   torsemide 20 MG tablet Commonly known as:  DEMADEX Take 3 tablets (60 mg total) by mouth daily. Start taking on:  09/20/2017   traMADol 50 MG tablet Commonly known as:  ULTRAM Take 1-2 tablets (50-100 mg total) by mouth every 4 (four)  hours as needed for moderate pain. Try tramadol for pain BEFORE using oxycodone.   warfarin 5 MG tablet Commonly known as:  COUMADIN Take 1 tablet (5 mg total) by mouth one time only at 6 PM. Further per HF clinic       Disposition   The patient will be discharged in stable condition to home with very close follow up as below.   Follow-up Information    [facility] Follow up.  Why:  Please call and request post hopsital follow up appt Contact information: [address] [place] [address] [phone]       [facility] Follow up.   Why:  You can also pursue a primary care doctor at this location as well.  Please call clinic to request information regarding appt and orange card (card will provide doctor copay assitance and medication assitance). Contact information: [address] [place] [address] [phone]       [facility] Follow up on 09/23/2017.   Specialty:  Cardiology Why:  at 1200 for post hospital follow up. Code for parking 1200. Psychologist, sport and exercise through [employer] representative off of [address], underground parking on your right. Can also park in lower ED lot and enter blue awning.  Contact information: [address] [phone] mc [place] [address] [phone]            Duration of Discharge Encounter: Greater than 35 minutes   Signed, Luane School  09/19/2017, 10:29 AM  Patient seen and examined with the above-signed Advanced Practice Provider and/or Housestaff. I personally reviewed laboratory data, imaging studies and relevant notes. I independently examined the patient and formulated the important aspects of the plan. I have edited the note to reflect any of my changes or salient points. I have personally discussed the plan with the patient and/or family.  Overall much improved. Volume status looks good. MAPs  stable. VAD interrogated personally. Parameters stable. INR 1.77. He is ready for d/c with close f./u in VAD Clinic on Friday. Discussed with Dr. Algie Coffer this am.   Arvilla Meres, MD  8:47 PM

## 2017-09-19 NOTE — Care Management Note (Addendum)
Case Management Note Previous CM note completed by [name] 08/30/2017, 9:53 AM   Patient Details  Name: Jonathan Wood MRN: [id number] Date of Birth: 12-23-1970  Subjective/Objective:   Pt admitted with SOB - HF                 Action/Plan:  PTA independent from home.  Pt confirmed he does not have insurance nor PCP.  Pt declined for CM to set up appt in a [place] clinic due to drive time associated with a PCP in [place].  Pt was only receptive to CM providing both [employer] and [employer]  information on AVS.  Neither [facility] nor [facility] had appts and pt is not within zip code parameters for [facility].  Per HF RN - pt will be monitored by HF SW and Pharm D for ongoing needs post discharge.  HF team to provide discharge medications.  Additional CM follow up notes:  09/06/17- 1600- [name] RN, CM- pt s/p HM3 VAD placement today- CM to continue to follow for transition of care needs  08/31/17- 1000- [name] RN, CM- pt tx to [facility] post cath- Admitted with recurrent heart failure, cardiogenic shock placed on IV inotrope-milrinone Plan for evaluation for implantable VAD- CM to follow for transition of care needs.    Expected Discharge Date:  09/19/17               Expected Discharge Plan:  Home/Self Care  In-House Referral:     Discharge planning Services  CM Consult  Post Acute Care Choice:    Choice offered to:  Patient  DME Arranged:  3-N-1 DME Agency:  [facility]  HH Arranged:  PT [facility] Agency:  [facility]  Status of Service:  Completed, signed off  If discussed at Long Length of Stay Meetings, dates discussed:    Discharge Disposition:   Additional Comments: 09/19/2017  Pt to discharge home today.  HF team will act as PCP until referral can be made through clinic for PCP in [place].  CM confirmed that pts medications will be paid for out of HF budget.  CM offered agency choice for [facility] - pt  choice [facility] - agency accepted referral via charity  09/16/17 CM again discussed lack of PCP with HF RN and PA - team will refer pt out to accepting PCP in [place].   09/19/2017  Pt now on Progressive Care unit.  CSW and HF SW working with pt on discharge location and pt has recently been evicted.  [facility] following pt for potential needs at discharge.  Pt educated on daily weights and low sodium intake Jonathan Parr, RN 09/19/2017, 2:30 PM [phone]

## 2017-09-19 NOTE — Progress Notes (Signed)
PICC removed by IV team. Pt received discharge instruction with prescriptions. Pt understood that he need to pick up the medications at outpatient pharmacy. He took all his belongings. HS [employer]

## 2017-09-19 NOTE — Progress Notes (Addendum)
LVAD Coordinator Rounding Note:  Admitted 08/25/17 due to CHF. Dr. [name] consulted for Advanced Heart Failure. VAD evaluation completed.   HM III LVAD implanted on 09/07/17 by 09/06/17 under Destination Therapy criteria due to current smoking history.  Pt sitting up in the chair. States he has walked around the unit this morning. Pt is "ready to go home" today.  Vital signs: Temp:  97.0 HR:  95 Auto cuff:  84/40 (53) - not correlating with doppler; please use doppler only Doppler: 82 O2 Sat: 98% on RA Wt: 189>195>200>201>194>188>187>177>175>176>177>182 lbs    LVAD interrogation reveals:  Speed: 5600 Flow: 5.3 Power: 2.7 PI:  2.7 Alarms: none Events: none  Hematocrit:  28 Fixed speed: 5600 Low speed limit: 5300  Drive Line: Dressing intact and anchor secure. Daily dressing changes per VAD Coordinator or caregiver.   Labs:  LDH trend: 420>483>404>367>360>353>342>317>290>243>245>239>227  INR trend: 1.38>1.58>1.41>1.61>2.21>2.13>2.42>2.68>2.8>1.84>1.82>1.66>1.60>1.77  Hgb trend: 8.5>8.2>8.3>7.3>8.8>9.3>9.3>9.1>9.2>9.2>8.8>8.6>8.6  Co-ox trend: 68>76>59>58>54>65>71.5>72>63>62>61   Anticoagulation Plan: -INR Goal: 2.0 - 2.5 -ASA Dose: 81 mg; started 09/11/17  Blood Products:  Intra op: - 1 unit plts - 2 FFP - 250 cell saver  Post-op: 1 unit PRBC-09/10/17  Device: N/A  NO: off 09/10/17  Gtts: - Levo>off 4/26 - Milrinone .25 mcg/kg/min - Epi 1 mcg/min off 4/28 - Amio 30 mg/hr off 4/26 - Lasix 10 mg/hr off 4/28  Arrhythmias: - Sinus tach 09/07/17 in 130's; started amiodarone (no bolus) trying to prevent post-op Afib  VAD Education:  VAD discharge teaching completed with patient and family Friday, 09/16/17 per Carlton Adam, VAD Coordinator.   Adverse Events on VAD: -none   Discharge equipment delivered and set up at bedside: 1. Two system controllers  2. One Mobile Power Unit with 20' patient cable 3. One universal Magazine features editor (UBC) 4. Eight fully  charged batteries  5. Four battery clips 6. One travel case 7. One battery holster 8. Wearable accessory package 9. 30 dressings and 10 anchors   Plan/Recommendations:  1. Discharge today with f/u Friday at 11:00 am. 2. Pt unsure how he will afford discharge med scripts. Obtained from [facility] OP pharmacy (except for pain meds)     per Heart Failure fund.  3.  Written Rx given for Oxy and Tramadol. Encouraged pt to try Tramadol for pain before going to Oxy. Pt      verbalized understanding of same.  5. Please call VAD pager if any VAD equipment or drive line issues.  Hessie Diener RN, VAD Coordinator 24/7 VAD pager: [phone]

## 2017-09-19 NOTE — Progress Notes (Signed)
Pt just walked with PT. Discussed sternal precautions, IS (got him a new one as his was lost), walking guidelines, low sodium, daily wts, smoking cessation, and CRPII. Pt very receptive. Sts he is not going to smoke again. Will refer to [facility] CRPII. [id number] Ethelda Chick CES, ACSM 1:53 PM 09/19/2017

## 2017-09-19 NOTE — Discharge Instructions (Signed)

## 2017-09-19 NOTE — Progress Notes (Signed)
CSW met with patient at bedside. Patient states he is feeling well and hopeful to go home today. Patient states that his family have been trained and he is ready. Patient spoke of his children and adjustment to life with an LVAD and a 45yo and 45yo at home. Patient shared need for assistance with referral to [employer] an organization that is assisting him with resources in [place]. Per patient's request CSW spoke with Claiborne Billings [phone] about needs and assistance provided by [employer]. CSW will continue to coordinate with patient and caseworkers to address multiple psychosocial needs. Patient grateful for assistance and support and appears to be ready to begin life at home with LVAD. CSW will continue to follow through the outpatient VAD clinic. [name], [place], [place]

## 2017-09-19 NOTE — Progress Notes (Signed)
Physical Therapy Treatment Patient Details Name: Jonathan Wood MRN: [id number] DOB: 1971/01/22 Today's Date: 09/19/2017    History of Present Illness The patient is a 47 year old African American male, who presented to the hospital with fatigue, edema, dizziness, and cardiogenic shock. 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 Extubated 4/24. PMHx: Asthma, CHF, GERD, and History of hiatal hernia.    PT Comments    Pt with excellent progression with all mobility without cues needed for basic transfers, on battery power on arrival and able to state all equipment needed for back up bag. PT verbalized precautions and comfort with power transitions. Pt educated for continue progression of gait and encouraged by current functional status. Pt reports being excited but nervous about home.  SpO2 97% on RA 98HR VSS    Follow Up Recommendations  Home health PT;Supervision/Assistance - 24 hour     Equipment Recommendations  3in1 (PT)    Recommendations for Other Services       Precautions / Restrictions Precautions Precautions: Sternal Precaution Comments: LVAD Restrictions Weight Bearing Restrictions: Yes Other Position/Activity Restrictions: sternal    Mobility  Bed Mobility Overal bed mobility: Independent             General bed mobility comments: pt in chair on arrival  Transfers Overall transfer level: Modified independent Equipment used: None Transfers: Sit to/from Stand Sit to Stand:          Ambulation/Gait Ambulation/Gait assistance: Supervision Ambulation Distance (Feet): 800 Feet Assistive device: None   Gait velocity: 102ft/17sec Gait velocity interpretation: 1.31 - 2.62 ft/sec, indicative of limited community ambulator General Gait Details: pt with good posture and speed with Spo2 maintained >95% on RA with increased gait speed   Stairs Stairs: Yes Stairs assistance: Modified independent (Device/Increase time) Stair Management: One rail Left;Alternating  pattern;Forwards Number of Stairs: 5 General stair comments: pt with good stability and no physical assist   Wheelchair Mobility    Modified Rankin (Stroke Patients Only)       Balance Overall balance assessment: No apparent balance deficits (not formally assessed)                                          Cognition Arousal/Alertness: Awake/alert Behavior During Therapy: WFL for tasks assessed/performed Overall Cognitive Status: Within Functional Limits for tasks assessed Area of Impairment: Memory                                  Exercises      General Comments        Pertinent Vitals/Pain Pain Assessment: No/denies pain    Home Living                      Prior Function            PT Goals (current goals can now be found in the care plan section) Progress towards PT goals: Progressing toward goals    Frequency    Min 3X/week      PT Plan Current plan remains appropriate;Frequency needs to be updated    Co-evaluation              AM-PAC PT "6 Clicks" Daily Activity  Outcome Measure  Difficulty turning over in bed (including adjusting bedclothes, sheets and blankets)?: None Difficulty moving from lying on back to  sitting on the side of the bed? : None Difficulty sitting down on and standing up from a chair with arms (e.g., wheelchair, bedside commode, etc,.)?: None Help needed moving to and from a bed to chair (including a wheelchair)?: None Help needed walking in hospital room?: A Little Help needed climbing 3-5 steps with a railing? : None 6 Click Score: 23    End of Session Equipment Utilized During Treatment: Gait belt Activity Tolerance: Patient tolerated treatment well Patient left: in chair Nurse Communication: Mobility status PT Visit Diagnosis: Other abnormalities of gait and mobility (R26.89)     Time: 1610-9604 PT Time Calculation (min) (ACUTE ONLY): 19 min  Charges:  $Gait Training:  8-22 mins                    G Codes:       Jonathan Wood, PT [phone]    Jonathan Wood Jonathan Wood Jonathan Wood 09/19/2017, 1:30 PM

## 2017-09-20 ENCOUNTER — Encounter (HOSPITAL_COMMUNITY): Payer: Self-pay | Admitting: *Deleted

## 2017-09-20 ENCOUNTER — Telehealth: Payer: Self-pay | Admitting: Licensed Clinical Social Worker

## 2017-09-20 ENCOUNTER — Encounter: Payer: Self-pay | Admitting: *Deleted

## 2017-09-20 NOTE — Telephone Encounter (Signed)
CSW received request from patient to contact [employer] to discuss further needs for assistance. CSW spoke with Markus Jarvis who requested information regarding pending medicaid and disability applications as well as current medical information related to disability. [employer] will assist patient with housing needs as well as primary care. Patient will be seen at the [facility] in [place] [phone] where he will have a primary care as well as integrated care needs.  CSW confirmed with patient and provided requested information to [employer]. Patient appreciative of assistance and states "I am wonderful" when asked how his first night home post implant. Patient appears to be in good spirits and has a positive attitude towards recovery. CSW will continue to follow and monitor through the outpatient VAD clinic. Lasandra Beech, LCSW, CCSW-MCS [phone]

## 2017-09-20 NOTE — Progress Notes (Signed)
Existing VAD dressing removed and site care performed using sterile technique. Drive line exit site cleaned with Chlora prep applicators x 2, allowed to dry, and gauze dressing with silver strip re-applied. Exit site with no tissue ingrowth, suture intact, bloody drainage with no foul odor, slight tenderness with cleansing, "burning"; no redness or rash noted.  The velour is fully implanted at exit site. Drive line anchor re-applied. Pt denies fever or chills.   Pt provided with dressing kits for home use. Will f/u in VAD Clinic this week.

## 2017-09-21 ENCOUNTER — Ambulatory Visit (HOSPITAL_COMMUNITY): Payer: Self-pay | Admitting: Pharmacist

## 2017-09-21 DIAGNOSIS — Z95811 Presence of heart assist device: Secondary | ICD-10-CM

## 2017-09-21 LAB — POCT INR: INR: 2.5

## 2017-09-22 ENCOUNTER — Other Ambulatory Visit (HOSPITAL_COMMUNITY): Payer: Self-pay | Admitting: Unknown Physician Specialty

## 2017-09-22 DIAGNOSIS — Z7901 Long term (current) use of anticoagulants: Secondary | ICD-10-CM

## 2017-09-22 DIAGNOSIS — Z95811 Presence of heart assist device: Secondary | ICD-10-CM

## 2017-09-23 ENCOUNTER — Ambulatory Visit (HOSPITAL_COMMUNITY)
Admission: RE | Admit: 2017-09-23 | Discharge: 2017-09-23 | Disposition: A | Discharge status: Home / Self Care | Payer: Medicaid Other | Source: Ambulatory Visit | Attending: Internal Medicine | Admitting: Internal Medicine

## 2017-09-23 ENCOUNTER — Ambulatory Visit (HOSPITAL_COMMUNITY): Payer: Self-pay | Admitting: Pharmacist

## 2017-09-23 VITALS — BP 92/0 | HR 86 | Ht 74.0 in | Wt 181.4 lb

## 2017-09-23 DIAGNOSIS — Z95811 Presence of heart assist device: Secondary | ICD-10-CM | POA: Diagnosis not present

## 2017-09-23 DIAGNOSIS — D649 Anemia, unspecified: Secondary | ICD-10-CM | POA: Insufficient documentation

## 2017-09-23 DIAGNOSIS — E876 Hypokalemia: Secondary | ICD-10-CM | POA: Insufficient documentation

## 2017-09-23 DIAGNOSIS — I428 Other cardiomyopathies: Secondary | ICD-10-CM | POA: Diagnosis not present

## 2017-09-23 DIAGNOSIS — Z7982 Long term (current) use of aspirin: Secondary | ICD-10-CM | POA: Diagnosis not present

## 2017-09-23 DIAGNOSIS — Z7901 Long term (current) use of anticoagulants: Secondary | ICD-10-CM

## 2017-09-23 DIAGNOSIS — I110 Hypertensive heart disease with heart failure: Secondary | ICD-10-CM | POA: Insufficient documentation

## 2017-09-23 DIAGNOSIS — J45909 Unspecified asthma, uncomplicated: Secondary | ICD-10-CM | POA: Insufficient documentation

## 2017-09-23 DIAGNOSIS — F17200 Nicotine dependence, unspecified, uncomplicated: Secondary | ICD-10-CM | POA: Diagnosis not present

## 2017-09-23 DIAGNOSIS — Z79899 Other long term (current) drug therapy: Secondary | ICD-10-CM | POA: Insufficient documentation

## 2017-09-23 DIAGNOSIS — F419 Anxiety disorder, unspecified: Secondary | ICD-10-CM

## 2017-09-23 DIAGNOSIS — K219 Gastro-esophageal reflux disease without esophagitis: Secondary | ICD-10-CM | POA: Insufficient documentation

## 2017-09-23 DIAGNOSIS — I5022 Chronic systolic (congestive) heart failure: Secondary | ICD-10-CM | POA: Diagnosis not present

## 2017-09-23 LAB — BASIC METABOLIC PANEL
Anion gap: 12 (ref 5–15)
BUN: 17 mg/dL (ref 6–20)
CHLORIDE: 95 mmol/L — AB (ref 101–111)
CO2: 30 mmol/L (ref 22–32)
Calcium: 9.2 mg/dL (ref 8.9–10.3)
Creatinine, Ser: 0.94 mg/dL (ref 0.61–1.24)
GFR calc Af Amer: >60 mL/min (ref >60)
GFR calc non Af Amer: >60 mL/min (ref >60)
Glucose, Bld: 111 mg/dL — ABNORMAL HIGH (ref 65–99)
POTASSIUM: 3.1 mmol/L — AB (ref 3.5–5.1)
SODIUM: 137 mmol/L (ref 135–145)

## 2017-09-23 LAB — CBC
HEMATOCRIT: 30.8 % — AB (ref 39.0–52.0)
HEMOGLOBIN: 9.7 g/dL — AB (ref 13.0–17.0)
MCH: 27.7 pg (ref 26.0–34.0)
MCHC: 31.5 g/dL (ref 30.0–36.0)
MCV: 88 fL (ref 78.0–100.0)
Platelets: 322 10*3/uL (ref 150–400)
RBC: 3.5 MIL/uL — AB (ref 4.22–5.81)
RDW: 15.2 % (ref 11.5–15.5)
WBC: 8.6 10*3/uL (ref 4.0–10.5)

## 2017-09-23 LAB — LACTATE DEHYDROGENASE: LDH: 223 U/L — ABNORMAL HIGH (ref 98–192)

## 2017-09-23 LAB — PROTIME-INR
INR: 2.34
PROTHROMBIN TIME: 25.5 s — AB (ref 11.4–15.2)

## 2017-09-23 MED ORDER — TORSEMIDE 20 MG PO TABS: 20.0000 mg | ORAL_TABLET | Freq: Every day | ORAL | 3 refills | Status: DC

## 2017-09-23 NOTE — Progress Notes (Signed)
Patient presents for hosp f/u follow up in VAD Clinic today. Reports no problems with VAD equipment or concerns with drive line.  Pt walked into clinic, a friend dropped him off. He states that he feels great and has been sitting out on the porch and walking in the yard with his kids.   Vital Signs:  Doppler Pressure 92  Automatc BP: 112/71 (101) HR: 86   SPO2: UTO %  Weight: 181.4 lb w/o eqt Last weight: 182 lb Home weights: 175-179 lbs   VAD Indication: Destination Therapy   VAD interrogation & Equipment Management: Speed: 5600 Flow: 5.2 Power: 4.3 w    PI: 3.2  Alarms: Pt had 2 low flow alarms on 5/7 from when he changed his controller due to "beeping." confirmed on now back up controller that the beeping was low batteries. Events: rare  Fixed speed 5600 Low speed limit: 5300  Primary Controller:  Replace back up battery in 33 months. Back up controller:   Replace back up battery in 33 months.  Annual Equipment Maintenance on UBC/PM was performed on 4/19.   I reviewed the LVAD parameters from today and compared the results to the patient's prior recorded data. LVAD interrogation was NEGATIVE for significant power changes, NEGATIVE for clinical alarms and STABLE for PI events/speed drops. No programming changes were made and pump is functioning within specified parameters. Pt is performing daily controller and system monitor self tests along with completing weekly and monthly maintenance for LVAD equipment.  LVAD equipment check completed and is in good working order. Back-up equipment present. Charged back up battery and performed self-test on equipment.   Exit Site Care: Drive line is being maintained daily  by [employer]. Drive line exit site well healing and unincorporated. The velour is fully implanted at exit site. No erythema or small amount of serous drainage. Distal stitch removed in clinic today, proximal stitch remains. CT sutures removed in clinic today as  well. Stabilization device present and accurately applied. Pt denies fever or chills. Pt states they have adequate dressing supplies at home.   Significant Events on VAD Support:    Device:none  BP & Labs:  MAP 92- Doppler is reflecting MAP  Hgb 9.7 - No S/S of bleeding. Specifically denies melena/BRBPR or nosebleeds.  LDH stable at 223 with established baseline of 200-300. Denies tea-colored urine. No power elevations noted on interrogation.   Pt provided with a pill box today. Pt has not obtained a thermometer yet but ensures me his mom will get one this weekend.   Plan: 1. Decrease Torsemide to 20 mg daily. 2. Return to clinic in 1 week.    Carlton Adam RN VAD Coordinator   Office: [phone] 24/7 Emergency VAD Pager: [phone]

## 2017-09-23 NOTE — Patient Instructions (Signed)
1. Decrease Torsemide to 20 mg daily (1 tablet.)

## 2017-09-24 NOTE — Op Note (Signed)
NAME: BILLAL, MINGE MEDICAL RECORD YP:[id number] ACCOUNT [id number] DATE OF BIRTH:02-24-1971 FACILITY: [facility] LOCATION: [facility] PHYSICIAN: VAN TRIGT III, MD  OPERATIVE REPORT  DATE OF PROCEDURE:  09/06/2017  OPERATION:  Implantation of HeartMate 3 left ventricular assist device.  SURGEON:  Mikey Bussing, MD  ASSISTANT:  Doree Fudge, PA-C  ANESTHESIA:  General.  PREOPERATIVE DIAGNOSES:  Nonischemic cardiomyopathy, cardiogenic shock, acute systolic heart failure.  POSTOPERATIVE DIAGNOSES:  Nonischemic cardiomyopathy, cardiogenic shock, acute systolic heart failure.  CLINICAL NOTE:  The patient is a 47 year old African-American male who was admitted to the hospital with fatigue, shortness of breath, fluid retention, and low cardiac output.  The advanced heart failure service admitted the patient.  He was found to be  in cardiogenic shock.  The patient was placed on milrinone and diuresed and showed evidence of improved symptoms and improved RV function and improved cardiac output.  He was felt to be a candidate for destination therapy, indication implantable  ventricular assist device, and he underwent full evaluation by the advanced heart failure service and mechanical support team.  He was found to be an appropriate candidate and was scheduled for urgent HeartMate 3 implantation.  Prior to surgery, I  discussed the expected benefits of HeartMate 3 implantation as well as the potential risks of HeartMate 3 implantation for treatment of his severe nonischemic cardiomyopathy, advanced heart failure and cardiogenic shock.  He understood those risks to  include bleeding, blood transfusion, infection, stroke, postoperative organ failure, infection, and death.  He demonstrated his understanding and agreed to proceed with surgery on what I felt was informed consent.  OPERATIVE FINDINGS:  RV function was moderately reduced but responded to inotropic support and afterload  reduction of the RV by the HeartMate 3 VAD.  DESCRIPTION OF PROCEDURE:  The patient was brought to the operating room from the preoperative area and placed supine on the operating table.  Invasive hemodynamic monitoring lines were placed, and the patient was induced for anesthesia and remained  stable.  A TEE probe was placed.  The patient was prepped and draped as a sterile field.  A proper time-out was performed.  Before a sternal incision was made, a small counter incision was made in the left midabdomen for the driveline exit tunnel, as well as an exit incision in the left upper quadrant for the driveline exit.  A sternal incision was made.  The pericardium was opened and suspended.  The heart was severely enlarged.  Heparin was administered.  Pursestrings were placed in the ascending aorta and right atrium, and after the heparin was therapeutic, the patient was  cannulated and placed on bypass.  The operative field was insufflated with CO2.  The LV apex was identified and the heart propped up on laparotomy pads to expose the apex well.  An incision was made in the apex, and the core knife was used to remove a segment of apical myocardium.  The inside of the ventricle was inspected.  There  was no thrombus but some thick trabeculae, which were excised.  Next, the inflow cannula sutures were placed around the circumference of the LV apex using #1 Vicryl pledgeted sutures.  Next, the sewing ring was sewn around the opening, and the sutures were tied and the incision was reinforced with a thin layer of  medical adhesive--CoSeal.  The pump was then brought to the field and volume was left of the LV, and the patient was placed in steep Trendelenburg.  With the heart filled, the  inflow cannula was inserted into the sewing ring and the locking mechanism engaged.  The pump was then  placed in the pericardium, and the PowerPort was tunneled through the previously placed incisions.  Next, the outflow  graft, which was clamped, was cut to the appropriate length and beveled.  Next, a partial occluding clamp was placed on the ascending aorta and an aortotomy was performed.  The bevelled flow graft was then sewn end-to-side to the aorta  using running 4-0 Prolene, and a light layer of medical adhesive CoSeal was applied.  The partial clamp was removed, and there was good hemostasis.  The lungs were then reexpanded, and the patient was placed on low to medium dose inotropic support, and the heart was filled.  The patient was then transitioned from cardiopulmonary bypass to support with a HeartMate 3 without difficulty.  The HeartMate  3 speed was set at 5400s with flows greater than 4.2 L and good pulsatile function.  Cardiac output was normal, and the patient was in a sinus rhythm.  Protamine was administered without adverse reaction.  The superior pericardial fat was closed over the aorta and the graft.  The graft was placed deep in the pericardium lateral to the right atrium.  Drainage tubes were placed in the anterior pericardium, posterior pericardium, and the pump pocket and brought out through separate incisions.  The patient remained stable during the sternal closure.  The chest wall was closed in routine fashion, and the skin closed with subcuticular.  The counter incision was closed in layers using Vicryl and the exit site in the left upper quadrant was  partially closed with a 3-0 nylon skin suture.  The patient remained stable and was transported back to the ICU in good condition.  Cardiopulmonary bypass time was 94 minutes.  LN/NUANCE  D:09/23/2017 T:09/24/2017 JOB:[id number]

## 2017-09-24 NOTE — Progress Notes (Signed)
VAD CLINIC NOTE   Primary cardiologist: Sharyn Lull  HPI:  Jonathan Wood is a 47 y.o. 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   Pt admitted 08/25/17 with ADHF and cardiogenic shock. Started on IV lasix and milrinone 0.125 mcg/kg/min with over 27 lbs of diuresis.   Echo showed EF 5-10%, with mild RV dilation, and mod to severe reduction in function.Case discussed with [facility] transplnt team but not candidate for transplant due to ongoing tobacco use.  Pt discussed at [facility] and determined to be appropriate candidate for LVAD -> HM3 for bridge to candidacy for transplant.   Pt underwent HM3 LVAD 09/06/17 without any immediate post-op complications. 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  Started on neurontin for pocket pain. Progressed will with PT and thought stable for home on discharge.  Follow up for Heart Failure/LVAD: Here for first post-implant clinic visit. Doing well. Feels he is getting stronger every day. Able to get around house and do all ADLs without difficulty. Also walking some. Denies Pocket pain, Anxiety much improved. Feels more confident. Denies orthopnea or PND. No edema. No fevers, chills or problems with driveline. No bleeding, melena or neuro symptoms. No VAD alarms. Taking all meds as prescribed. Family very involved with care.   Had episode where battery power ran down and he thought it was the Controller so changed the controller himself.   VAD Indication: Destination Therapy   VAD interrogation & Equipment Management: Speed: 5600 Flow: 5.2 Power: 4.3 w PI: 3.2  Alarms: Pt had 2 low flow alarms on 5/7 from when he changed his controller due to "beeping." confirmed on now back up controller that the beeping was low batteries. Events: rare  Fixed speed 5600 Low speed limit: 5300  Primary Controller: Replace back up battery in 33  months. Back up controller: Replace back up battery in 10months.  Annual Equipment Maintenance on UBC/PM was performed on 4/19.  Denies LVAD alarms.  Denies driveline trauma, erythema or drainage.  Denies ICD shocks.   Reports taking Coumadin as prescribed and adherence to anticoagulation based dietary restrictions.  Denies bright red blood per rectum or melena, no dark urine or hematuria.     Past Medical History:  Diagnosis Date  . Asthma   . CHF (congestive heart failure) (HCC)    a. 09/2016: EF 20-25% with cath showing normal cors  . GERD (gastroesophageal reflux disease)   . History of hiatal hernia     Current Outpatient Medications  Medication Sig Dispense Refill  . amiodarone (PACERONE) 200 MG tablet Take 200 mg BID until 09/23/17, then decrease to 200 mg daily. 36 tablet 3  . aspirin EC 81 MG tablet Take 81 mg by mouth daily.    . citalopram (CELEXA) 10 MG tablet Take 1 tablet (10 mg total) by mouth daily. 30 tablet 3  . gabapentin (NEURONTIN) 100 MG capsule Take 1 capsule (100 mg total) by mouth 3 (three) times daily. 90 capsule 6  . oxyCODONE (OXY IR/ROXICODONE) 5 MG immediate release tablet Take 1-2 tablets (5-10 mg total) by mouth every 6 (six) hours as needed for severe pain. 40 tablet 0  . pantoprazole (PROTONIX) 40 MG tablet Take 1 tablet (40 mg total) by mouth daily. 30 tablet 6  . sildenafil (REVATIO) 20 MG tablet Take 1 tablet (20 mg total) by mouth 3 (three) times daily. 90 tablet 3  .  torsemide (DEMADEX) 20 MG tablet Take 1 tablet (20 mg total) by mouth daily. 90 tablet 3  . traMADol (ULTRAM) 50 MG tablet Take 1-2 tablets (50-100 mg total) by mouth every 4 (four) hours as needed for moderate pain. Try tramadol for pain BEFORE using oxycodone. 45 tablet 0  . warfarin (COUMADIN) 5 MG tablet Take 5 mg by mouth daily.    . feeding supplement, ENSURE ENLIVE, (ENSURE ENLIVE) LIQD Take 237 mLs by mouth 2 (two) times daily between meals. (Patient not taking: Reported on  09/23/2017) 237 mL 12  . polyethylene glycol (MIRALAX / GLYCOLAX) packet Take 17 g by mouth daily. (Patient not taking: Reported on 09/23/2017) 30 each 0   No current facility-administered medications for this encounter.     Patient has no known allergies.  REVIEW OF SYSTEMS: All systems negative except as listed in HPI, PMH and Problem list.    Vitals:   09/23/17 1106 09/23/17 1111  BP: 112/71 (!) 92/0  Pulse: 86   Weight: 181 lb 6.4 oz (82.3 kg)   Height: 6\' 2"  (1.88 m)    Vital Signs:  Doppler Pressure 92    Automatc BP: 112/71 (101) HR: 86  SPO2: UTO %  Weight: 181.4 lb w/o eqt Last weight: 182 lb Home weights: 175-179 lbs   Physical Exam: GENERAL: Well appearing, male who presents to clinic today in no acute distress. HEENT: normal  NECK: Supple, JVP flat .  2+ bilaterally, no bruits.  No lymphadenopathy or thyromegaly appreciated.   CARDIAC:  Sternal incision well healed. Mechanical heart sounds with LVAD hum present.  LUNGS:  Clear to auscultation bilaterally.  ABDOMEN:  Soft, round, nontender, positive bowel sounds x4.     LVAD exit site: well-healed and incorporated.  Dressing dry and intact.  No erythema or drainage.  Stabilization device present and accurately applied.  Driveline dressing is being changed daily per sterile technique. EXTREMITIES:  Warm and dry, no cyanosis, clubbing, rash or edema  NEUROLOGIC:  Alert and oriented x 4.  Gait steady.  No aphasia.  No dysarthria.  Affect pleasant.      ASSESSMENT AND PLAN:   1. Chronic systolic HF - EF 16% s/p HM-3 LVAD on 09/06/17 - Progressing well. NYHA II - Volume status looks good - MAPs well controlled  - Cut torsemide from 60 daily to 20 daily. Can take extra as needed  2. LVAD - VAD interrogated personally. Parameters stable. - Long discussion about when to change batteries and never to change controller by himself - Driveline site looks good. Driveline care reviewed.  - LDH 223 - INR goal  2.0-2.5. INR 2.34 today. Discussed dosing with PharmD personally. Continue ASA  3. Hypokalemia - K 3.1. Will supp   4. Anxiety - much improved.  - continue Celexa  5. Post-op anemia - Hgb 9.7. Continue to follow  Total time spent 35 minutes. Over half that time spent discussing above.   Arvilla Meres, MD  10:33 PM

## 2017-09-27 ENCOUNTER — Encounter (HOSPITAL_COMMUNITY): Payer: Self-pay

## 2017-09-27 ENCOUNTER — Telehealth: Payer: Self-pay | Admitting: Licensed Clinical Social Worker

## 2017-09-27 NOTE — Telephone Encounter (Signed)
CSW left message for Disability Determinations to inquire about status of disability application. Awaiting return call. Lasandra Beech, LCSW, CCSW-MCS [phone]

## 2017-09-30 ENCOUNTER — Ambulatory Visit (HOSPITAL_COMMUNITY)
Admission: RE | Admit: 2017-09-30 | Discharge: 2017-09-30 | Disposition: A | Discharge status: Home / Self Care | Payer: Medicaid Other | Source: Ambulatory Visit | Attending: Internal Medicine | Admitting: Internal Medicine

## 2017-09-30 ENCOUNTER — Telehealth (HOSPITAL_COMMUNITY): Payer: Self-pay | Admitting: Surgery

## 2017-09-30 ENCOUNTER — Encounter (HOSPITAL_COMMUNITY): Payer: Self-pay

## 2017-09-30 ENCOUNTER — Ambulatory Visit (HOSPITAL_COMMUNITY): Payer: Self-pay | Admitting: Pharmacist

## 2017-09-30 ENCOUNTER — Other Ambulatory Visit (HOSPITAL_COMMUNITY): Payer: Self-pay | Admitting: Unknown Physician Specialty

## 2017-09-30 ENCOUNTER — Other Ambulatory Visit (HOSPITAL_COMMUNITY): Payer: Self-pay | Admitting: Surgery

## 2017-09-30 VITALS — BP 129/65 | HR 81 | Ht 74.0 in | Wt 183.4 lb

## 2017-09-30 DIAGNOSIS — D649 Anemia, unspecified: Secondary | ICD-10-CM | POA: Diagnosis not present

## 2017-09-30 DIAGNOSIS — Z79899 Other long term (current) drug therapy: Secondary | ICD-10-CM | POA: Insufficient documentation

## 2017-09-30 DIAGNOSIS — I428 Other cardiomyopathies: Secondary | ICD-10-CM | POA: Insufficient documentation

## 2017-09-30 DIAGNOSIS — Z4502 Encounter for adjustment and management of automatic implantable cardiac defibrillator: Secondary | ICD-10-CM | POA: Insufficient documentation

## 2017-09-30 DIAGNOSIS — I420 Dilated cardiomyopathy: Secondary | ICD-10-CM | POA: Diagnosis not present

## 2017-09-30 DIAGNOSIS — Z95811 Presence of heart assist device: Secondary | ICD-10-CM

## 2017-09-30 DIAGNOSIS — Z7901 Long term (current) use of anticoagulants: Secondary | ICD-10-CM | POA: Diagnosis not present

## 2017-09-30 DIAGNOSIS — J45909 Unspecified asthma, uncomplicated: Secondary | ICD-10-CM | POA: Insufficient documentation

## 2017-09-30 DIAGNOSIS — K219 Gastro-esophageal reflux disease without esophagitis: Secondary | ICD-10-CM | POA: Diagnosis not present

## 2017-09-30 DIAGNOSIS — F419 Anxiety disorder, unspecified: Secondary | ICD-10-CM | POA: Insufficient documentation

## 2017-09-30 DIAGNOSIS — E876 Hypokalemia: Secondary | ICD-10-CM | POA: Diagnosis not present

## 2017-09-30 DIAGNOSIS — I110 Hypertensive heart disease with heart failure: Secondary | ICD-10-CM | POA: Diagnosis not present

## 2017-09-30 DIAGNOSIS — I5022 Chronic systolic (congestive) heart failure: Secondary | ICD-10-CM | POA: Diagnosis present

## 2017-09-30 DIAGNOSIS — Z7982 Long term (current) use of aspirin: Secondary | ICD-10-CM | POA: Diagnosis not present

## 2017-09-30 LAB — BASIC METABOLIC PANEL
Anion gap: 13 (ref 5–15)
BUN: 9 mg/dL (ref 6–20)
CALCIUM: 8.6 mg/dL — AB (ref 8.9–10.3)
CHLORIDE: 99 mmol/L — AB (ref 101–111)
CO2: 26 mmol/L (ref 22–32)
Creatinine, Ser: 1.08 mg/dL (ref 0.61–1.24)
GFR calc non Af Amer: >60 mL/min (ref >60)
Glucose, Bld: 181 mg/dL — ABNORMAL HIGH (ref 65–99)
Potassium: 2.9 mmol/L — ABNORMAL LOW (ref 3.5–5.1)
SODIUM: 138 mmol/L (ref 135–145)

## 2017-09-30 LAB — CBC
HCT: 32.1 % — ABNORMAL LOW (ref 39.0–52.0)
HEMOGLOBIN: 9.8 g/dL — AB (ref 13.0–17.0)
MCH: 26.7 pg (ref 26.0–34.0)
MCHC: 30.5 g/dL (ref 30.0–36.0)
MCV: 87.5 fL (ref 78.0–100.0)
Platelets: 238 10*3/uL (ref 150–400)
RBC: 3.67 MIL/uL — ABNORMAL LOW (ref 4.22–5.81)
RDW: 15.2 % (ref 11.5–15.5)
WBC: 6.5 10*3/uL (ref 4.0–10.5)

## 2017-09-30 LAB — LACTATE DEHYDROGENASE: LDH: 198 U/L — AB (ref 98–192)

## 2017-09-30 LAB — PROTIME-INR
INR: 1.93
PROTHROMBIN TIME: 21.9 s — AB (ref 11.4–15.2)

## 2017-09-30 MED ORDER — TORSEMIDE 20 MG PO TABS: 20.0000 mg | ORAL_TABLET | Freq: Every day | ORAL | 3 refills | Status: DC

## 2017-09-30 MED ORDER — POTASSIUM CHLORIDE CRYS ER 20 MEQ PO TBCR: EXTENDED_RELEASE_TABLET | ORAL | 3 refills | Status: DC

## 2017-09-30 MED FILL — POTASSIUM CL ER 20 MEQ TABL: 20 | 44 days supply | Qty: 90 | Fill #0

## 2017-09-30 NOTE — Progress Notes (Signed)
Patient presents for hosp f/u follow up in VAD Clinic today. His Mom is present with him. Reports no problems with VAD equipment or concerns with drive line.  Pt states that he walked 15 minutes yesterday with Physical Therapy.  Vital Signs:  Doppler Pressure 82  Automatc BP: 121/65 (92) HR: 81  SPO2: UTO %  Weight: 183.4 lb w/o eqt Last weight: 181.4 lb Home weights: 175-179 lbs   VAD Indication: Destination Therapy   VAD interrogation & Equipment Management: Speed: 5600 Flow: 5.0 Power: 4.3 w    PI: 3.6  Alarms: none  Events: rare  Fixed speed 5600 Low speed limit: 5300  Primary Controller:  Replace back up battery in 33 months. Back up controller:   Replace back up battery in 33 months.  Annual Equipment Maintenance on UBC/PM was performed on 4/19.   I reviewed the LVAD parameters from today and compared the results to the patient's prior recorded data. LVAD interrogation was NEGATIVE for significant power changes, NEGATIVE for clinical alarms and STABLE for PI events/speed drops. No programming changes were made and pump is functioning within specified parameters. Pt is performing daily controller and system monitor self tests along with completing weekly and monthly maintenance for LVAD equipment.  LVAD equipment check completed and is in good working order. Back-up equipment present. Charged back up battery and performed self-test on equipment.   Exit Site Care: Drive line is being maintained daily  by [employer].  Existing VAD dressing removed and site care performed using sterile technique. Drive line exit site cleaned with Chlora prep applicators x 2, allowed to dry, and gauze dressing with silver strip re-applied. Drive line exit site well healing and partially incorporated. The velour is fully implanted at exit site. No erythema,  small amount of serous drainage. Proximal stitch removed in clinic today. Stabilization device present and accurately applied. Pt  denies fever or chills. Pt given 8 daily kits and 4 anchors.       Significant Events on VAD Support:    Device:none  BP & Labs:  MAP 92- Doppler is reflecting MAP  Hgb 9.8 - No S/S of bleeding. Specifically denies melena/BRBPR or nosebleeds.  LDH stable at 198 with established baseline of 200-300. Denies tea-colored urine. No power elevations noted on interrogation.   Plan: 1. Start 40 mEq of Potassium daily, Take 80 mEq of potassium now. 2. Return to clinic in 1 week.    Carlton Adam RN VAD Coordinator   Office: [phone] 24/7 Emergency VAD Pager: [phone]

## 2017-09-30 NOTE — Progress Notes (Signed)
Advanced Heart Failure VAD Clinic Note   Primary cardiologist: Harwani Primary HF: Dr. [name]  HPI:  Jonathan Wood is a 47 y.o. 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   Pt admitted 08/25/17 with ADHF and cardiogenic shock. Started on IV lasix and milrinone 0.125 mcg/kg/min with over 27 lbs of diuresis.   Echo showed EF 5-10%, with mild RV dilation, and mod to severe reduction in function.Case discussed with [facility] transplnt team but not candidate for transplant due to ongoing tobacco use.  Pt discussed at [facility] and determined to be appropriate candidate for LVAD -> HM3 for bridge to candidacy for transplant.   Pt underwent HM3 LVAD 09/06/17 without any immediate post-op complications. 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  Started on neurontin for pocket pain. Progressed will with PT and thought stable for home on discharge.  Follow up for Heart Failure/LVAD:  Here for second post-implant visit. Last visit torsemide cut back from 60 to 20 mg daily. Weight up 2 lbs. He is feeling great. Walked 15 minutes yesterday with his personal trainer without difficulty. Feels stronger every day. Not smoking or drinking. Says he can't even stand to be around cigarettes any more. Denies SOB with ADLs. He continues to have mild pocket pain, mostly positional and at night. Anxiety much improved. Denies orthopnea or PND. No fevers, chills, or problems with driveline. No edema. No bleeding, melena, or neuro symptoms. No VAD alarms. Taking all meds as prescribed. His family is very involved with his care.   VAD Indication: Destination Therapy   VAD interrogation & Equipment Management: Speed: 5600 Flow: 5.0 Power: 4.3 w PI: 3.6  Alarms: None Events: Rare PI events.   Fixed speed 5600 Low speed limit: 5300  Primary Controller: Replace back up battery in 33 months. Back up  controller: Replace back up battery in 50months.  Annual Equipment Maintenance on UBC/PM was performed on 4/19.  Reports taking Coumadin as prescribed and adherence to anticoagulation based dietary restrictions.  Denies bright red blood per rectum or melena, no dark urine or hematuria.    I reviewed the LVAD parameters from today, and compared the results to the patient's prior recorded data.  No programming changes were made.  The LVAD is functioning within specified parameters.  The patient performs LVAD self-test daily.  LVAD interrogation was negative for any significant power changes, alarms or PI events/speed drops.  LVAD equipment check completed and is in good working order.  Back-up equipment present.   LVAD education done on emergency procedures and precautions and reviewed exit site care.   Past Medical History:  Diagnosis Date  . Asthma   . CHF (congestive heart failure) (HCC)    a. 09/2016: EF 20-25% with cath showing normal cors  . GERD (gastroesophageal reflux disease)   . History of hiatal hernia     Current Outpatient Medications  Medication Sig Dispense Refill  . amiodarone (PACERONE) 200 MG tablet Take 200 mg BID until 09/23/17, then decrease to 200 mg daily. 36 tablet 3  . aspirin EC 81 MG tablet Take 81 mg by mouth daily.    . citalopram (CELEXA) 10 MG tablet Take 1 tablet (10 mg total) by mouth daily. 30 tablet 3  . feeding supplement, ENSURE ENLIVE, (ENSURE ENLIVE) LIQD Take 237 mLs by mouth 2 (two) times daily between meals. (Patient not taking: Reported on 09/23/2017) 237  mL 12  . gabapentin (NEURONTIN) 100 MG capsule Take 1 capsule (100 mg total) by mouth 3 (three) times daily. 90 capsule 6  . oxyCODONE (OXY IR/ROXICODONE) 5 MG immediate release tablet Take 1-2 tablets (5-10 mg total) by mouth every 6 (six) hours as needed for severe pain. 40 tablet 0  . pantoprazole (PROTONIX) 40 MG tablet Take 1 tablet (40 mg total) by mouth daily. 30 tablet 6  . polyethylene  glycol (MIRALAX / GLYCOLAX) packet Take 17 g by mouth daily. (Patient not taking: Reported on 09/23/2017) 30 each 0  . sildenafil (REVATIO) 20 MG tablet Take 1 tablet (20 mg total) by mouth 3 (three) times daily. 90 tablet 3  . torsemide (DEMADEX) 20 MG tablet Take 1 tablet (20 mg total) by mouth daily. 90 tablet 3  . traMADol (ULTRAM) 50 MG tablet Take 1-2 tablets (50-100 mg total) by mouth every 4 (four) hours as needed for moderate pain. Try tramadol for pain BEFORE using oxycodone. 45 tablet 0  . warfarin (COUMADIN) 5 MG tablet Take 5 mg by mouth daily.     No current facility-administered medications for this encounter.    Patient has no known allergies.  Review of systems complete and found to be negative unless listed in HPI.    Vitals:   09/30/17 1203 09/30/17 1204  BP: (!) 88/0 129/65  Pulse: 81   Weight: 183 lb 6.4 oz (83.2 kg)   Height: 6\' 2"  (1.88 m)      Wt Readings from Last 3 Encounters:  09/23/17 181 lb 6.4 oz (82.3 kg)  09/19/17 182 lb 1.6 oz (82.6 kg)  01/28/17 214 lb (97.1 kg)    Vital Signs:  Doppler Pressure 88   Automatc BP: 121/65 (92) HR: 81 SPO2: UTO  Weight: 186.4 lb w/o eqt Last weight: 181.4 lb Home weights: ~ 180 +/- 2 lbs  Physical Exam: General:  NAD.  HEENT: normal  Neck: supple. JVP not elevated.  Carotids 2+ bilat; no bruits. No lymphadenopathy or thryomegaly appreciated. Cor: LVAD hum.  Lungs: Clear. Abdomen: soft, nontender, non-distended. No hepatosplenomegaly. No bruits or masses. Good bowel sounds. Driveline site clean. Anchor in place.  Extremities: no cyanosis, clubbing, rash. Warm no edema  Neuro: alert & oriented x 3. No focal deficits. Moves all 4 without problem   ASSESSMENT AND PLAN:   1. Chronic systolic HF - EF 53% s/p HM-3 LVAD on 09/06/17 - NYHA II and improving with LVAD support - Volume status looks good   - MAPs ok  - Continue torsemide 20 mg daily. Can take extra as needed.   2. HM3 LVAD 09/06/2017. - VAD  interrogated personally. Parameters stable. - Continued education from VAD coordinators.  - Driveline site looks stable.  - LDH 198  - INR goal 2.0-2.5. INR 1.93 today. Discussed dosing with Pharm-D personally.  - Continue ASA.  - He has been on amiodarone 200 mg daily, with NSVT and to prevent post-op afib. Can stop amio.   3. Hypokalemia - K 2.9 - Will give kcl 40 x 2 today and start 40 daily. Recheck on Monday  4. Anxiety - Much improved on Celexa.   5. Post-op anemia - Hgb stable at 9.8  Total time spent 35 minutes. Over half that time spent discussing above.   Arvilla Meres, MD  6:40 PM

## 2017-09-30 NOTE — Telephone Encounter (Signed)
New order for Potassium sent to [facility] to be filled through the HF Fund.  Medication will be delivered to patient by Carlton Adam --RN VAD Coordinator.

## 2017-10-05 ENCOUNTER — Telehealth (HOSPITAL_COMMUNITY): Payer: Self-pay

## 2017-10-05 NOTE — Telephone Encounter (Signed)
Debbie PT [facility] called to report patient doing well with PT and will graduate from their services after next week. Reports patient ahead of schedule and can walk 12 minutes outside unassisted without devices.  Ave Filter, RN

## 2017-10-06 ENCOUNTER — Other Ambulatory Visit (HOSPITAL_COMMUNITY): Payer: Self-pay | Admitting: *Deleted

## 2017-10-06 DIAGNOSIS — I5043 Acute on chronic combined systolic (congestive) and diastolic (congestive) heart failure: Secondary | ICD-10-CM

## 2017-10-06 DIAGNOSIS — Z7901 Long term (current) use of anticoagulants: Secondary | ICD-10-CM

## 2017-10-06 DIAGNOSIS — Z95811 Presence of heart assist device: Secondary | ICD-10-CM

## 2017-10-07 ENCOUNTER — Ambulatory Visit (HOSPITAL_COMMUNITY): Payer: Self-pay | Admitting: Pharmacist

## 2017-10-07 ENCOUNTER — Ambulatory Visit (HOSPITAL_COMMUNITY)
Admission: RE | Admit: 2017-10-07 | Discharge: 2017-10-07 | Disposition: A | Discharge status: Home / Self Care | Payer: Medicaid Other | Source: Ambulatory Visit | Attending: Internal Medicine | Admitting: Internal Medicine

## 2017-10-07 ENCOUNTER — Encounter (HOSPITAL_COMMUNITY): Payer: Self-pay

## 2017-10-07 VITALS — BP 90/0 | HR 92 | Ht 74.0 in | Wt 187.6 lb

## 2017-10-07 DIAGNOSIS — I110 Hypertensive heart disease with heart failure: Secondary | ICD-10-CM | POA: Insufficient documentation

## 2017-10-07 DIAGNOSIS — Z95811 Presence of heart assist device: Secondary | ICD-10-CM | POA: Diagnosis not present

## 2017-10-07 DIAGNOSIS — Z7982 Long term (current) use of aspirin: Secondary | ICD-10-CM | POA: Insufficient documentation

## 2017-10-07 DIAGNOSIS — Z7901 Long term (current) use of anticoagulants: Secondary | ICD-10-CM | POA: Insufficient documentation

## 2017-10-07 DIAGNOSIS — I428 Other cardiomyopathies: Secondary | ICD-10-CM | POA: Insufficient documentation

## 2017-10-07 DIAGNOSIS — I5022 Chronic systolic (congestive) heart failure: Secondary | ICD-10-CM | POA: Insufficient documentation

## 2017-10-07 DIAGNOSIS — F419 Anxiety disorder, unspecified: Secondary | ICD-10-CM | POA: Diagnosis not present

## 2017-10-07 DIAGNOSIS — K219 Gastro-esophageal reflux disease without esophagitis: Secondary | ICD-10-CM | POA: Diagnosis not present

## 2017-10-07 DIAGNOSIS — J45909 Unspecified asthma, uncomplicated: Secondary | ICD-10-CM | POA: Diagnosis not present

## 2017-10-07 DIAGNOSIS — E876 Hypokalemia: Secondary | ICD-10-CM | POA: Diagnosis not present

## 2017-10-07 DIAGNOSIS — I5043 Acute on chronic combined systolic (congestive) and diastolic (congestive) heart failure: Secondary | ICD-10-CM | POA: Diagnosis not present

## 2017-10-07 DIAGNOSIS — D649 Anemia, unspecified: Secondary | ICD-10-CM | POA: Insufficient documentation

## 2017-10-07 DIAGNOSIS — Z79899 Other long term (current) drug therapy: Secondary | ICD-10-CM | POA: Insufficient documentation

## 2017-10-07 LAB — LACTATE DEHYDROGENASE: LDH: 213 U/L — ABNORMAL HIGH (ref 98–192)

## 2017-10-07 LAB — BASIC METABOLIC PANEL
ANION GAP: 9 (ref 5–15)
BUN: 9 mg/dL (ref 6–20)
CO2: 25 mmol/L (ref 22–32)
Calcium: 9 mg/dL (ref 8.9–10.3)
Chloride: 105 mmol/L (ref 101–111)
Creatinine, Ser: 1.1 mg/dL (ref 0.61–1.24)
GLUCOSE: 166 mg/dL — AB (ref 65–99)
Potassium: 3.7 mmol/L (ref 3.5–5.1)
SODIUM: 139 mmol/L (ref 135–145)

## 2017-10-07 LAB — PROTIME-INR
INR: 2.75
Prothrombin Time: 28.9 seconds — ABNORMAL HIGH (ref 11.4–15.2)

## 2017-10-07 LAB — CBC
HCT: 33.8 % — ABNORMAL LOW (ref 39.0–52.0)
Hemoglobin: 10.1 g/dL — ABNORMAL LOW (ref 13.0–17.0)
MCH: 26.9 pg (ref 26.0–34.0)
MCHC: 29.9 g/dL — AB (ref 30.0–36.0)
MCV: 89.9 fL (ref 78.0–100.0)
PLATELETS: 219 10*3/uL (ref 150–400)
RBC: 3.76 MIL/uL — AB (ref 4.22–5.81)
RDW: 15.8 % — AB (ref 11.5–15.5)
WBC: 6.5 10*3/uL (ref 4.0–10.5)

## 2017-10-07 NOTE — Progress Notes (Signed)
Patient presents for 1 week f/u follow up in VAD Clinic today. His Mom dropped him off. Reports no problems with VAD equipment or concerns with drive line.  Pt states that he walked 19 minutes yesterday with Physical Therapy.  Vital Signs:  Doppler Pressure 90 Automatc BP: 133/64 (87) HR: 92  SPO2: 98 %  Weight: 187.6 lb w/o eqt Last weight: 183.4 lb Home weights: 175-179 lbs   VAD Indication: Destination Therapy   VAD interrogation & Equipment Management: Speed: 5600 Flow: 5.0  Power: 4.3 w    PI: 3.6  Alarms: none  Events: rare  Fixed speed 5600 Low speed limit: 5300  Primary Controller:  Replace back up battery in 33 months. Back up controller:   Replace back up battery in 33 months.  Annual Equipment Maintenance on UBC/PM was performed on 4/19.   I reviewed the LVAD parameters from today and compared the results to the patient's prior recorded data. LVAD interrogation was NEGATIVE for significant power changes, NEGATIVE for clinical alarms and STABLE for PI events/speed drops. No programming changes were made and pump is functioning within specified parameters. Pt is performing daily controller and system monitor self tests along with completing weekly and monthly maintenance for LVAD equipment.  LVAD equipment check completed and is in good working order. Back-up equipment present. Charged back up battery and performed self-test on equipment.   Exit Site Care: Drive line is being maintained daily  by [employer].  Existing VAD dressing removed and site care performed using sterile technique. Drive line exit site cleaned with Chlora prep applicators x 2, allowed to dry, and gauze dressing with silver strip re-applied. Drive line exit site well healing and partially incorporated. The velour is fully implanted at exit site. No erythema,  small amount of serous drainage. Stabilization device present and accurately applied. Pt denies fever or chills. Pt given 8 daily kits  and 4 anchors and a bottle of silver strips.May advance to every other day.      Significant Events on VAD Support:    Device:none  BP & Labs:  MAP 90- Doppler is reflecting MAP  Hgb 10.1 - No S/S of bleeding. Specifically denies melena/BRBPR or nosebleeds.  LDH stable at 213 with established baseline of 200-300. Denies tea-colored urine. No power elevations noted on interrogation.   Plan: 1. May advance dressing changes to every other day. 2. Return to clinic in 1 week with 6 wk post op chest xray. 3. No changes in medications.   Carlton Adam RN VAD Coordinator   Office: [phone] 24/7 Emergency VAD Pager: [phone]

## 2017-10-07 NOTE — Progress Notes (Signed)
Advanced Heart Failure VAD Clinic Note   Primary cardiologist: Harwani Primary HF: Dr. [name]  HPI:  Jonathan Wood is a 47 y.o. 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   Pt admitted 08/25/17 with ADHF and cardiogenic shock. Started on IV lasix and milrinone 0.125 mcg/kg/min with over 27 lbs of diuresis.   Echo showed EF 5-10%, with mild RV dilation, and mod to severe reduction in function.Case discussed with [facility] transplnt team but not candidate for transplant due to ongoing tobacco use.  Pt discussed at [facility] and determined to be appropriate candidate for LVAD -> HM3 for bridge to candidacy for transplant.   Pt underwent HM3 LVAD 09/06/17 without any immediate post-op complications. 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  Started on neurontin for pocket pain. Progressed will with PT and thought stable for home on discharge.  Follow up for Heart Failure/LVAD:  Here for thrid post-implant visit. Continues to feel stronger. Says he walked 19 minutes with PT yesterday. Denies SOB, edema orthopnea or PND but seemed mildly dyspneic on changing positions today. Remains on torsemide 20 daily. Denies orthopnea or PND. No fevers, chills or problems with driveline. No bleeding, melena or neuro symptoms. No VAD alarms. Taking all meds as prescribed.   VAD Indication: Destination Therapy   VAD interrogation & Equipment Management: Speed: 5600 Flow: 5.0  Power: 4.3 w PI: 3.6  Alarms: none  Events: rare  Fixed speed 5600 Low speed limit: 5300  Primary Controller: Replace back up battery in 33 months. Back up controller: Replace back up battery in 51months.  Annual Equipment Maintenance on UBC/PM was performed on 4/19.   Reports taking Coumadin as prescribed and adherence to anticoagulation based dietary restrictions.  Denies bright red blood per rectum or  melena, no dark urine or hematuria.    I reviewed the LVAD parameters from today, and compared the results to the patient's prior recorded data.  No programming changes were made.  The LVAD is functioning within specified parameters.  The patient performs LVAD self-test daily.  LVAD interrogation was negative for any significant power changes, alarms or PI events/speed drops.  LVAD equipment check completed and is in good working order.  Back-up equipment present.   LVAD education done on emergency procedures and precautions and reviewed exit site care.   Past Medical History:  Diagnosis Date  . Asthma   . CHF (congestive heart failure) (HCC)    a. 09/2016: EF 20-25% with cath showing normal cors  . GERD (gastroesophageal reflux disease)   . History of hiatal hernia     Current Outpatient Medications  Medication Sig Dispense Refill  . amiodarone (PACERONE) 200 MG tablet Take 200 mg BID until 09/23/17, then decrease to 200 mg daily. (Patient taking differently: daily. Take 200 mg BID until 09/23/17, then decrease to 200 mg daily.) 36 tablet 3  . aspirin EC 81 MG tablet Take 81 mg by mouth daily.    . citalopram (CELEXA) 10 MG tablet Take 1 tablet (10 mg total) by mouth daily. 30 tablet 3  . gabapentin (NEURONTIN) 100 MG capsule Take 1 capsule (100 mg total) by mouth 3 (three) times daily. 90 capsule 6  . oxyCODONE (OXY IR/ROXICODONE) 5 MG immediate release tablet Take 1-2 tablets (5-10 mg total) by mouth every 6 (six) hours as needed for severe pain. 40 tablet 0  . pantoprazole (PROTONIX) 40  MG tablet Take 1 tablet (40 mg total) by mouth daily. 30 tablet 6  . potassium chloride SA (KLOR-CON M20) 20 MEQ tablet Please take 40 meq twice today and then 40 meq daily starting tomorrow. 90 tablet 3  . sildenafil (REVATIO) 20 MG tablet Take 1 tablet (20 mg total) by mouth 3 (three) times daily. 90 tablet 3  . torsemide (DEMADEX) 20 MG tablet Take 1 tablet (20 mg total) by mouth daily. May take an extra  one if needed. 90 tablet 3  . traMADol (ULTRAM) 50 MG tablet Take 1-2 tablets (50-100 mg total) by mouth every 4 (four) hours as needed for moderate pain. Try tramadol for pain BEFORE using oxycodone. 45 tablet 0  . warfarin (COUMADIN) 5 MG tablet Take 5 mg by mouth daily.      No current facility-administered medications for this encounter.    Patient has no known allergies.  Review of systems complete and found to be negative unless listed in HPI.    Vitals:   10/07/17 1115 10/07/17 1116  BP: 133/64 (!) 90/0  Pulse: 92   SpO2: 98%   Weight: 187 lb 9.6 oz (85.1 kg)   Height: 6\' 2"  (1.88 m)      Wt Readings from Last 3 Encounters:  10/07/17 187 lb 9.6 oz (85.1 kg)  09/30/17 183 lb 6.4 oz (83.2 kg)  09/23/17 181 lb 6.4 oz (82.3 kg)    Vital Signs:  Doppler Pressure 90 Automatc BP: 133/64 (87) HR: 92 SPO2: 98 %  Weight: 187.6 lb w/o eqt Last weight: 183.4 lb Home weights: 175-179 lbs  Physical Exam: General:  NAD. Mildly dyspneic when changing positions  HEENT: normal  Neck: supple. JVP not elevated.  Carotids 2+ bilat; no bruits. No lymphadenopathy or thryomegaly appreciated. Cor: LVAD hum.  Lungs: Clear. Abdomen: soft, nontender, non-distended. No hepatosplenomegaly. No bruits or masses. Good bowel sounds. Driveline site clean. Anchor in place.  Extremities: no cyanosis, clubbing, rash. Warm no edema  Neuro: alert & oriented x 3. No focal deficits. Moves all 4 without problem   ASSESSMENT AND PLAN:   1. Chronic systolic HF - EF 16% s/p HM-3 LVAD on 09/06/17 - Now 1 month out from VAD implant.  - Continues to improve. NYHA II - Volume status looks good. But was dyspneic on changing positions. - REDs vest applied. 28% (euvolemic) - Continue torsemide 20 daily - MAPs ok    2. HM3 LVAD 09/06/2017. - VAD interrogated personally. Parameters stable. - Driveline site looks good - LDH 213  - INR goal 2.0-2.5. INR 2.75 today. Discussed dosing with PharmD  personally. - Continue ASA.   3. Hypokalemia - K 3.7 today (improved) Continue kcl   4. Anxiety - Much improved on Celexa.   5. Post-op anemia - Continues to improve. Hgb 10.1 today MCV 89   Total time spent 35 minutes. Over half that time spent discussing above.   Arvilla Meres, MD  10:33 PM

## 2017-10-13 ENCOUNTER — Other Ambulatory Visit (HOSPITAL_COMMUNITY): Payer: Self-pay | Admitting: Unknown Physician Specialty

## 2017-10-13 DIAGNOSIS — Z95811 Presence of heart assist device: Secondary | ICD-10-CM

## 2017-10-13 DIAGNOSIS — Z7901 Long term (current) use of anticoagulants: Secondary | ICD-10-CM

## 2017-10-14 ENCOUNTER — Ambulatory Visit (HOSPITAL_COMMUNITY): Payer: Self-pay | Admitting: Pharmacist

## 2017-10-14 ENCOUNTER — Encounter (HOSPITAL_COMMUNITY): Payer: Self-pay

## 2017-10-14 ENCOUNTER — Ambulatory Visit (HOSPITAL_COMMUNITY)
Admission: RE | Admit: 2017-10-14 | Discharge: 2017-10-14 | Disposition: A | Discharge status: Home / Self Care | Payer: Medicaid Other | Source: Ambulatory Visit | Attending: Internal Medicine | Admitting: Internal Medicine

## 2017-10-14 ENCOUNTER — Encounter (HOSPITAL_COMMUNITY): Payer: Self-pay | Admitting: *Deleted

## 2017-10-14 VITALS — BP 100/0 | HR 108 | Ht 74.0 in | Wt 194.6 lb

## 2017-10-14 DIAGNOSIS — Z79891 Long term (current) use of opiate analgesic: Secondary | ICD-10-CM | POA: Diagnosis not present

## 2017-10-14 DIAGNOSIS — Z79899 Other long term (current) drug therapy: Secondary | ICD-10-CM | POA: Diagnosis not present

## 2017-10-14 DIAGNOSIS — K219 Gastro-esophageal reflux disease without esophagitis: Secondary | ICD-10-CM | POA: Insufficient documentation

## 2017-10-14 DIAGNOSIS — Z7901 Long term (current) use of anticoagulants: Secondary | ICD-10-CM

## 2017-10-14 DIAGNOSIS — I429 Cardiomyopathy, unspecified: Secondary | ICD-10-CM | POA: Insufficient documentation

## 2017-10-14 DIAGNOSIS — D649 Anemia, unspecified: Secondary | ICD-10-CM | POA: Insufficient documentation

## 2017-10-14 DIAGNOSIS — K449 Diaphragmatic hernia without obstruction or gangrene: Secondary | ICD-10-CM | POA: Insufficient documentation

## 2017-10-14 DIAGNOSIS — I5043 Acute on chronic combined systolic (congestive) and diastolic (congestive) heart failure: Secondary | ICD-10-CM

## 2017-10-14 DIAGNOSIS — Z95811 Presence of heart assist device: Secondary | ICD-10-CM

## 2017-10-14 DIAGNOSIS — F419 Anxiety disorder, unspecified: Secondary | ICD-10-CM

## 2017-10-14 DIAGNOSIS — I110 Hypertensive heart disease with heart failure: Secondary | ICD-10-CM | POA: Insufficient documentation

## 2017-10-14 DIAGNOSIS — I5022 Chronic systolic (congestive) heart failure: Secondary | ICD-10-CM | POA: Diagnosis not present

## 2017-10-14 DIAGNOSIS — Z7982 Long term (current) use of aspirin: Secondary | ICD-10-CM | POA: Diagnosis not present

## 2017-10-14 DIAGNOSIS — Z09 Encounter for follow-up examination after completed treatment for conditions other than malignant neoplasm: Secondary | ICD-10-CM | POA: Diagnosis not present

## 2017-10-14 DIAGNOSIS — E876 Hypokalemia: Secondary | ICD-10-CM | POA: Diagnosis not present

## 2017-10-14 DIAGNOSIS — I420 Dilated cardiomyopathy: Secondary | ICD-10-CM | POA: Diagnosis not present

## 2017-10-14 LAB — BASIC METABOLIC PANEL
Anion gap: 8 (ref 5–15)
BUN: 9 mg/dL (ref 6–20)
CALCIUM: 8.8 mg/dL — AB (ref 8.9–10.3)
CHLORIDE: 105 mmol/L (ref 101–111)
CO2: 24 mmol/L (ref 22–32)
CREATININE: 1.03 mg/dL (ref 0.61–1.24)
GFR calc non Af Amer: >60 mL/min (ref >60)
GLUCOSE: 145 mg/dL — AB (ref 65–99)
Potassium: 3.4 mmol/L — ABNORMAL LOW (ref 3.5–5.1)
Sodium: 137 mmol/L (ref 135–145)

## 2017-10-14 LAB — CBC
HEMATOCRIT: 33.8 % — AB (ref 39.0–52.0)
HEMOGLOBIN: 10.2 g/dL — AB (ref 13.0–17.0)
MCH: 26.9 pg (ref 26.0–34.0)
MCHC: 30.2 g/dL (ref 30.0–36.0)
MCV: 89.2 fL (ref 78.0–100.0)
Platelets: 200 10*3/uL (ref 150–400)
RBC: 3.79 MIL/uL — ABNORMAL LOW (ref 4.22–5.81)
RDW: 15.7 % — ABNORMAL HIGH (ref 11.5–15.5)
WBC: 6.9 10*3/uL (ref 4.0–10.5)

## 2017-10-14 LAB — PROTIME-INR
INR: 2.8
Prothrombin Time: 29.3 seconds — ABNORMAL HIGH (ref 11.4–15.2)

## 2017-10-14 LAB — LACTATE DEHYDROGENASE: LDH: 208 U/L — AB (ref 98–192)

## 2017-10-14 MED ORDER — SACUBITRIL-VALSARTAN 24-26 MG PO TABS: 1.0000 | ORAL_TABLET | Freq: Two times a day (BID) | ORAL | 6 refills | Status: DC

## 2017-10-14 MED ORDER — TORSEMIDE 20 MG PO TABS: ORAL_TABLET | ORAL | 3 refills | Status: DC

## 2017-10-14 NOTE — Progress Notes (Signed)
Patient presents for 1 week f/u follow up in VAD Clinic today with his mom. Reports no problems with VAD equipment or concerns with drive line.  Pt states that he walked 1230 ft during 6 mo walk yesterday; he has been discharged by Physical Therapy.  Pt requested letter from MD outlining his inability to return to work; this will help with housing assistance - letter given.   Vital Signs:  Doppler Pressure: 100 Automatc BP: 136/71 (98) HR: 108 SPO2: UTO  Weight: 194.6 lb w/o eqt Last weight: 187.6 lb Home weights: 178 - 180 lbs   VAD Indication: Destination Therapy   VAD interrogation & Equipment Management: Speed: 5600 Flow: 4.9  Power: 4.3 w    PI: 3.4  Hct: 34 Alarms: none  Events: rare  Fixed speed 5600 Low speed limit: 5300  Primary Controller:  Replace back up battery in 33 months. Back up controller:   Replace back up battery in 33 months.  Annual Equipment Maintenance on UBC/PM was performed on 4/19.   I reviewed the LVAD parameters from today and compared the results to the patient's prior recorded data. LVAD interrogation was NEGATIVE for significant power changes, NEGATIVE for clinical alarms and STABLE for PI events/speed drops. No programming changes were made and pump is functioning within specified parameters. Pt is performing daily controller and system monitor self tests along with completing weekly and monthly maintenance for LVAD equipment.  LVAD equipment check completed and is in good working order. Back-up equipment present. Charged back up battery and performed self-test on equipment.   Exit Site Care: Drive line is being maintained daily  by [employer].  Existing VAD dressing removed and site care performed using sterile technique. Drive line exit site cleaned with Chlora prep applicators x 2, allowed to dry, and gauze dressing with silver strip re-applied. Drive line exit site well healing and partially incorporated. The velour is fully implanted  at exit site. No erythema,  small amount of serous drainage. Stabilization device present and accurately applied. Pt denies fever or chills. Pt given 8 anchors; continue every other day dressing changes.   Significant Events on VAD Support:    Device:none  BP & Labs:  MAP 100 - Doppler is reflecting MAP  Hgb 10.2  - No S/S of bleeding. Specifically denies melena/BRBPR or nosebleeds.  LDH stable at 208 with established baseline of 200-300. Denies tea-colored urine. No power elevations noted on interrogation.   Patient Instructions:  1. Stop taking Torsemide daily; only take if needed for swelling.  2. Start Entresto 24/26 twice daily. Call if any dizzy spells. 3. Reduce warfarin to one tab daily except 1/2 tab on Friday. Re-check INR next Friday at [facility]. 4. May start driving. 5. Return to VAD clinic on October 26, 2017 at 11:00  Hessie Diener RN VAD Coordinator   Office: [phone] 24/7 Emergency VAD Pager: [phone]

## 2017-10-14 NOTE — Progress Notes (Signed)
Advanced Heart Failure VAD Clinic Note   Primary cardiologist: Harwani Primary HF: Dr. [name]  HPI:  Jonathan Wood is a 47 y.o. 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   Pt admitted 08/25/17 with ADHF and cardiogenic shock. Started on IV lasix and milrinone 0.125 mcg/kg/min with over 27 lbs of diuresis.   Echo showed EF 5-10%, with mild RV dilation, and mod to severe reduction in function.Case discussed with [facility] transplnt team but not candidate for transplant due to ongoing tobacco use.  Pt discussed at [facility] and determined to be appropriate candidate for LVAD -> HM3 for bridge to candidacy for transplant.   Pt underwent HM3 LVAD 09/06/17 without any immediate post-op complications. 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  Started on neurontin for pocket pain. Progressed will with PT and thought stable for home on discharge.  Follow up for Heart Failure/LVAD:  He is feeling great today. Walked 1230 feet in 6 minutes recently. He denies SOB or edema.  His dyspnea has resolved on torsemide 20 mg daily. He has not had to take any extra. No dizziness or presyncope. Can walk 20 minutes at a time. Does all ADLs without problem.   He denies orthopnea or PND. No fevers, chills, or problems with driveline. No bleeding, melena, or neuro symptoms. No VAD alarms. Taking all medications as prescribed.   VAD Indication: Destination Therapy   VAD interrogation & Equipment Management: Speed: 5600 Flow: 4.9 Power: 4.3 w PI: 3.4  Alarms: None Events: Rare  Fixed speed 5600 Low speed limit: 5300  Primary Controller: Replace back up battery in 33 months. Back up controller: Replace back up battery in 33months.  Annual Equipment Maintenance on UBC/PM was performed on 4/19.  Reports taking Coumadin as prescribed and adherence to anticoagulation based dietary  restrictions.  Denies bright red blood per rectum or melena, no dark urine or hematuria.     I reviewed the LVAD parameters from today, and compared the results to the patient's prior recorded data.  No programming changes were made.  The LVAD is functioning within specified parameters.  The patient performs LVAD self-test daily.  LVAD interrogation was negative for any significant power changes, alarms or PI events/speed drops.  LVAD equipment check completed and is in good working order.  Back-up equipment present.   LVAD education done on emergency procedures and precautions and reviewed exit site care.  Past Medical History:  Diagnosis Date  . Asthma   . CHF (congestive heart failure) (HCC)    a. 09/2016: EF 20-25% with cath showing normal cors  . GERD (gastroesophageal reflux disease)   . History of hiatal hernia     Current Outpatient Medications  Medication Sig Dispense Refill  . amiodarone (PACERONE) 200 MG tablet Take 200 mg BID until 09/23/17, then decrease to 200 mg daily. (Patient taking differently: daily. Take 200 mg BID until 09/23/17, then decrease to 200 mg daily.) 36 tablet 3  . aspirin EC 81 MG tablet Take 81 mg by mouth daily.    . citalopram (CELEXA) 10 MG tablet Take 1 tablet (10 mg total) by mouth daily. 30 tablet 3  . gabapentin (NEURONTIN) 100 MG capsule Take 1 capsule (100 mg total) by mouth 3 (three) times daily. 90 capsule 6  . oxyCODONE (OXY IR/ROXICODONE) 5 MG immediate release tablet Take 1-2 tablets (5-10 mg total) by mouth every 6 (  six) hours as needed for severe pain. 40 tablet 0  . pantoprazole (PROTONIX) 40 MG tablet Take 1 tablet (40 mg total) by mouth daily. 30 tablet 6  . potassium chloride SA (KLOR-CON M20) 20 MEQ tablet Please take 40 meq twice today and then 40 meq daily starting tomorrow. 90 tablet 3  . sildenafil (REVATIO) 20 MG tablet Take 1 tablet (20 mg total) by mouth 3 (three) times daily. 90 tablet 3  . torsemide (DEMADEX) 20 MG tablet Take 1  tablet (20 mg total) by mouth daily. May take an extra one if needed. 90 tablet 3  . traMADol (ULTRAM) 50 MG tablet Take 1-2 tablets (50-100 mg total) by mouth every 4 (four) hours as needed for moderate pain. Try tramadol for pain BEFORE using oxycodone. 45 tablet 0  . warfarin (COUMADIN) 5 MG tablet Take 5 mg by mouth daily.      No current facility-administered medications for this encounter.    Patient has no known allergies.  Review of systems complete and found to be negative unless listed in HPI.    Vitals:   10/14/17 1132 10/14/17 1133  BP: 136/71 (!) 100/0  Pulse: (!) 108   Weight: 194 lb 9.6 oz (88.3 kg)   Height: 6\' 2"  (1.88 m)      Wt Readings from Last 3 Encounters:  10/14/17 194 lb 9.6 oz (88.3 kg)  10/07/17 187 lb 9.6 oz (85.1 kg)  09/30/17 183 lb 6.4 oz (83.2 kg)    Vital Signs:  Doppler Pressure 100 Automatc BP: 136/71 (98) HR: 108 SPO2: UTO  Weight: 194.9 w/o eqt Last weight: 187.6 lb Home weights: 182-185 lbs  Physical Exam: General:  NAD.  HEENT: normal  Neck: supple. JVP not elevated.  Carotids 2+ bilat; no bruits. No lymphadenopathy or thryomegaly appreciated. Cor: LVAD hum.  Lungs: Clear. Abdomen: soft, nontender, non-distended. No hepatosplenomegaly. No bruits or masses. Good bowel sounds. Driveline site clean. Anchor in place.  Extremities: no cyanosis, clubbing, rash. Warm no edema  Neuro: alert & oriented x 3. No focal deficits. Moves all 4 without problem       ASSESSMENT AND PLAN:   1. Chronic systolic HF - EF 62% s/p HM-3 LVAD on 09/06/17 - Doing well post VAD implant.  - NYHA II 1 month out from implant.  - Volume status stable to very mildly elevated despite weight gain.  - Start Entresto 24/26 mg BID.  - Change torsemide to as needed.  - MAPs elevated, meds as above.  - Will need referral to [facility] in 6 months to begin the transplant work-up. He has been off cigarettes since before VAD implant.   2. HM3 LVAD 09/06/2017. - VAD  interrogated personally. Parameters stable.  - Driveline site stable.  - LDH 208 - INR goal 2.0-2.5. INR 2.80 today. Discussed dosing with Pharm-D personally.  - Continue ASA.   3. Hypokalemia - K 3.4 today. Adding Entresto as above.   4. Anxiety - Much improved on Celexa.  - No change to current plan.    5. Post-op anemia - Stable at Hgb 10.2 today. MVC 89.2.   Graciella Freer, PA-C  11:37 AM   Patient seen and examined with the above-signed Advanced Practice Provider and/or Housestaff. I personally reviewed laboratory data, imaging studies and relevant notes. I independently examined the patient and formulated the important aspects of the plan. I have edited the note to reflect any of my changes or salient points. I have personally discussed the  plan with the patient and/or family.  Overall continues to improve. Now NYHA II,. Volume status looks good. MAP elevated. Will add Entresto 24/26 bid. Change torsemide to prn. VAD interrogated personally. Parameters stable. INR ok. Driveline site looks good. Will need referral to [facility] for transplant eval in several months.   Arvilla Meres, MD  12:10 PM

## 2017-10-14 NOTE — Patient Instructions (Signed)
1. Stop taking Torsemide daily; only take if needed for swelling.  2. Start Entresto 24/26 twice daily. Call if any dizzy spells. 3. Reduce warfarin to one tab daily except 1/2 tab on Friday. Re-check INR next Friday at [facility]. 4. May start driving. 5. Return to VAD clinic on October 26, 2017 at 11:00

## 2017-10-14 NOTE — Progress Notes (Signed)
CSW met with patient in the clinic. Patient reports he is doing well and progressing with recovery. Patient shared pictures of children from recent events and so grateful for his progress. Patient asked about a letter for possible housing assistance which was provided in clinic. Patient stated that he has not heard from Social security yet and asked about process. Patient appears to be doing well and adjusting to VAD life. CSW contacted worker in [place] and message left for return call. CSW will assist if possible and continue to follow through VAD clinic. [name], [place], [place]

## 2017-10-21 ENCOUNTER — Inpatient Hospital Stay (HOSPITAL_COMMUNITY): Admission: RE | Admit: 2017-10-21 | Payer: Self-pay | Source: Ambulatory Visit

## 2017-10-26 ENCOUNTER — Ambulatory Visit (HOSPITAL_COMMUNITY): Payer: Self-pay | Admitting: Pharmacist

## 2017-10-26 ENCOUNTER — Encounter (HOSPITAL_COMMUNITY): Payer: Self-pay

## 2017-10-26 ENCOUNTER — Ambulatory Visit (HOSPITAL_COMMUNITY)
Admission: RE | Admit: 2017-10-26 | Discharge: 2017-10-26 | Disposition: A | Discharge status: Home / Self Care | Payer: Medicaid Other | Source: Ambulatory Visit | Attending: Internal Medicine | Admitting: Internal Medicine

## 2017-10-26 ENCOUNTER — Other Ambulatory Visit (HOSPITAL_COMMUNITY): Payer: Self-pay | Admitting: *Deleted

## 2017-10-26 VITALS — BP 112/0 | HR 106 | Ht 74.0 in | Wt 201.6 lb

## 2017-10-26 DIAGNOSIS — Z7901 Long term (current) use of anticoagulants: Secondary | ICD-10-CM

## 2017-10-26 DIAGNOSIS — I5042 Chronic combined systolic (congestive) and diastolic (congestive) heart failure: Secondary | ICD-10-CM

## 2017-10-26 DIAGNOSIS — J441 Chronic obstructive pulmonary disease with (acute) exacerbation: Secondary | ICD-10-CM | POA: Insufficient documentation

## 2017-10-26 DIAGNOSIS — I420 Dilated cardiomyopathy: Secondary | ICD-10-CM

## 2017-10-26 DIAGNOSIS — F419 Anxiety disorder, unspecified: Secondary | ICD-10-CM | POA: Diagnosis not present

## 2017-10-26 DIAGNOSIS — Z95811 Presence of heart assist device: Secondary | ICD-10-CM

## 2017-10-26 DIAGNOSIS — I428 Other cardiomyopathies: Secondary | ICD-10-CM | POA: Diagnosis not present

## 2017-10-26 DIAGNOSIS — I110 Hypertensive heart disease with heart failure: Secondary | ICD-10-CM | POA: Diagnosis not present

## 2017-10-26 DIAGNOSIS — K219 Gastro-esophageal reflux disease without esophagitis: Secondary | ICD-10-CM | POA: Diagnosis not present

## 2017-10-26 DIAGNOSIS — Z79899 Other long term (current) drug therapy: Secondary | ICD-10-CM | POA: Diagnosis not present

## 2017-10-26 DIAGNOSIS — J45909 Unspecified asthma, uncomplicated: Secondary | ICD-10-CM | POA: Diagnosis not present

## 2017-10-26 DIAGNOSIS — D649 Anemia, unspecified: Secondary | ICD-10-CM | POA: Diagnosis not present

## 2017-10-26 DIAGNOSIS — E876 Hypokalemia: Secondary | ICD-10-CM | POA: Diagnosis not present

## 2017-10-26 DIAGNOSIS — I5022 Chronic systolic (congestive) heart failure: Secondary | ICD-10-CM | POA: Diagnosis present

## 2017-10-26 DIAGNOSIS — I5043 Acute on chronic combined systolic (congestive) and diastolic (congestive) heart failure: Secondary | ICD-10-CM

## 2017-10-26 DIAGNOSIS — Z87891 Personal history of nicotine dependence: Secondary | ICD-10-CM | POA: Diagnosis not present

## 2017-10-26 DIAGNOSIS — Z7982 Long term (current) use of aspirin: Secondary | ICD-10-CM | POA: Diagnosis not present

## 2017-10-26 LAB — LACTATE DEHYDROGENASE: LDH: 196 U/L — ABNORMAL HIGH (ref 98–192)

## 2017-10-26 LAB — BASIC METABOLIC PANEL
ANION GAP: 7 (ref 5–15)
BUN: 10 mg/dL (ref 6–20)
CO2: 24 mmol/L (ref 22–32)
Calcium: 8.9 mg/dL (ref 8.9–10.3)
Chloride: 109 mmol/L (ref 101–111)
Creatinine, Ser: 0.96 mg/dL (ref 0.61–1.24)
GFR calc Af Amer: >60 mL/min (ref >60)
Glucose, Bld: 95 mg/dL (ref 65–99)
POTASSIUM: 3.7 mmol/L (ref 3.5–5.1)
SODIUM: 140 mmol/L (ref 135–145)

## 2017-10-26 LAB — CBC
HCT: 35.6 % — ABNORMAL LOW (ref 39.0–52.0)
Hemoglobin: 10.7 g/dL — ABNORMAL LOW (ref 13.0–17.0)
MCH: 26.8 pg (ref 26.0–34.0)
MCHC: 30.1 g/dL (ref 30.0–36.0)
MCV: 89.2 fL (ref 78.0–100.0)
PLATELETS: 190 10*3/uL (ref 150–400)
RBC: 3.99 MIL/uL — AB (ref 4.22–5.81)
RDW: 15.8 % — ABNORMAL HIGH (ref 11.5–15.5)
WBC: 6.3 10*3/uL (ref 4.0–10.5)

## 2017-10-26 LAB — PROTIME-INR
INR: 2.67
Prothrombin Time: 28.2 seconds — ABNORMAL HIGH (ref 11.4–15.2)

## 2017-10-26 NOTE — Progress Notes (Signed)
Advanced Heart Failure VAD Clinic Note   Primary cardiologist: Harwani Primary HF: Dr. [name]  HPI:  Jonathan Wood is a 47 y.o. 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   Admitted 08/25/17 with ADHF and cardiogenic shock. Started on IV lasix and milrinone 0.125 mcg/kg/min with over 27 lbs of diuresis.   Echo showed EF 5-10%, with mild RV dilation, and mod to severe reduction in function.Case discussed with [facility] transplnt team but not candidate for transplant due to ongoing tobacco use.  Underwent HM3 LVAD 09/06/17 without any immediate post-op complications. 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  Started on neurontin for pocket pain. Progressed will with PT and thought stable for home on discharge.  Follow up for Heart Failure/LVAD:  He presents today for follow up. Last visit started on Entresto. Feeling great. He denies any dizziness or lightheadedness. He is now walking up to 25 minutes daily. Also out playing ball with his kids which he couldn't do before.  He is taking torsemide only as needed after the addition of Entresto. He took one this week due to weight gain with improvement. Denies orthopnea or PND. No fevers, chills, or problems with driveline. He denies bleeding, melena, or neuro symptoms. No VAD alarms. Taking all medications as prescribed. Denies any tobacco, ETOH or drug use. Says he can't even be around it   VAD Indication: Destination Therapy -> Bridge to Transplant if can stop smoking.   VAD interrogation & Equipment Management: Speed: 5600 Flow: 4.5 Power: 4.2 w PI: 5.3  Alarms: None Events: 0-5  Fixed speed 5600 Low speed limit: 5300  Primary Controller: Replace back up battery in 32 months. Back up controller: Replace back up battery in 32months.  Annual Equipment Maintenance on UBC/PM was performed on 4/19.  Reports  taking Coumadin as prescribed and adherence to anticoagulation based dietary restrictions.  Denies bright red blood per rectum or melena, no dark urine or hematuria.     I reviewed the LVAD parameters from today, and compared the results to the patient's prior recorded data.  No programming changes were made.  The LVAD is functioning within specified parameters.  The patient performs LVAD self-test daily.  LVAD interrogation was negative for any significant power changes, alarms or PI events/speed drops.  LVAD equipment check completed and is in good working order.  Back-up equipment present.   LVAD education done on emergency procedures and precautions and reviewed exit site care.   Past Medical History:  Diagnosis Date  . Asthma   . CHF (congestive heart failure) (HCC)    a. 09/2016: EF 20-25% with cath showing normal cors  . GERD (gastroesophageal reflux disease)   . History of hiatal hernia     Current Outpatient Medications  Medication Sig Dispense Refill  . amiodarone (PACERONE) 200 MG tablet Take 200 mg BID until 09/23/17, then decrease to 200 mg daily. (Patient taking differently: daily. Take 200 mg BID until 09/23/17, then decrease to 200 mg daily.) 36 tablet 3  . aspirin EC 81 MG tablet Take 81 mg by mouth daily.    . citalopram (CELEXA) 10 MG tablet Take 1 tablet (10 mg total) by mouth daily. 30 tablet 3  . gabapentin (NEURONTIN) 100 MG capsule Take 1 capsule (100 mg total) by mouth 3 (three) times daily. 90 capsule 6  . oxyCODONE (OXY IR/ROXICODONE) 5 MG immediate release tablet  Take 1-2 tablets (5-10 mg total) by mouth every 6 (six) hours as needed for severe pain. 40 tablet 0  . pantoprazole (PROTONIX) 40 MG tablet Take 1 tablet (40 mg total) by mouth daily. 30 tablet 6  . potassium chloride SA (KLOR-CON M20) 20 MEQ tablet Please take 40 meq twice today and then 40 meq daily starting tomorrow. 90 tablet 3  . sacubitril-valsartan (ENTRESTO) 24-26 MG Take 1 tablet by mouth 2 (two)  times daily. 60 tablet 6  . sildenafil (REVATIO) 20 MG tablet Take 1 tablet (20 mg total) by mouth 3 (three) times daily. 90 tablet 3  . torsemide (DEMADEX) 20 MG tablet May take an extra one if needed. 90 tablet 3  . traMADol (ULTRAM) 50 MG tablet Take 1-2 tablets (50-100 mg total) by mouth every 4 (four) hours as needed for moderate pain. Try tramadol for pain BEFORE using oxycodone. 45 tablet 0  . warfarin (COUMADIN) 5 MG tablet Take 5 mg by mouth daily.      No current facility-administered medications for this encounter.    Patient has no known allergies.  Review of systems complete and found to be negative unless listed in HPI.    Vitals:   10/26/17 1121 10/26/17 1123 10/26/17 1130 10/26/17 1159  BP: (!) 117/37 (!) 120/0 (!) 93/53 (!) 112/0  Pulse: (!) 106     SpO2: 99%     Weight: 201 lb 9.6 oz (91.4 kg)     Height: 6\' 2"  (1.88 m)        Wt Readings from Last 3 Encounters:  10/26/17 201 lb 9.6 oz (91.4 kg)  10/14/17 194 lb 9.6 oz (88.3 kg)  10/07/17 187 lb 9.6 oz (85.1 kg)    Vital Signs:  Doppler Pressure 112 Automatc BP: 83/53 (68) HR: 106 SPO2: 99%  Weight: 201.6 w/o eqt Last weight: 194.9 lbs  Home weights: 182-185 lbs  Physical Exam: General:  NAD.  HEENT: normal  Neck: supple. JVP 5-6.  Carotids 2+ bilat; no bruits. No lymphadenopathy or thryomegaly appreciated. Cor: LVAD hum.  Lungs: Clear. Abdomen: soft, nontender, non-distended. No hepatosplenomegaly. No bruits or masses. Good bowel sounds. Driveline site clean. Anchor in place.  Extremities: no cyanosis, clubbing, rash. Warm no edema  Neuro: alert & oriented x 3. No focal deficits. Moves all 4 without problem    ASSESSMENT AND PLAN:   1. Chronic systolic HF - EF 08% s/p HM-3 LVAD on 09/06/17 - Doing well post VAD implant.  - NYHA II symptoms.  - Volume status stable on exam - Continue torsemide as needed for weight gain 3 lbs overnight or 5 lbs within one week.  - Continue Entresto 24/26 mg BID.  No room to increase.  - Will need referral to [facility] in 6 months to begin the transplant work-up. He has been off cigarettes since before VAD implant. Will start monthly cotinine checks.   2. HM3 LVAD 09/06/2017. - VAD interrogated personally. Parameters stable.  - Driveline site stable.  - LDH 196 - INR goal 2.0-2.5. INR 2.67 today. Discussed dosing with Pharm-D personally.  - Continue ASA.   3. Hypokalemia - K 3.7. Stable   4. Anxiety - Much improved on Celexa.  - No change to current plan.    5. Post-op anemia - Stable at 10.7 today. MCV 89.2.   Graciella Freer, PA-C  11:27 AM   Patient seen and examined with the above-signed Advanced Practice Provider and/or Housestaff. I personally reviewed laboratory data, imaging studies  and relevant notes. I independently examined the patient and formulated the important aspects of the plan. I have edited the note to reflect any of my changes or salient points. I have personally discussed the plan with the patient and/or family.  He is doing great s/p VAD. Now NYHA I-II. Volume status looks good. Using lasix only PRN in setting of Entresto. MAP ok. VAD interrogated personally. Parameters stable. Driveline site looks good. INR 2.67 Discussed dosing with PharmD personally.   Arvilla Meres, MD  11:58 PM

## 2017-10-26 NOTE — Progress Notes (Signed)
CSW met with patient in the clinic. Patient reports he has not heard anything from [facility] or from [employer]. CSW and patient contacted Disability Determination to obtain an update although unable to reach caseworker so message left. A call was also placed to the [facility] office with message left there as well. CSW discussed process and encouraged patient to call with any mail or calls received for CSW to assist further. Patient verbalizes understanding and will continue to follow through [facility]. [name], [place], [place]

## 2017-10-26 NOTE — Progress Notes (Signed)
Patient presents for 2 week f/u follow up in VAD Clinic today with his daughter. Reports no problems with VAD equipment or concerns with drive line.  Pt started driving after last visit and reports no problems with same. HH is no longer coming to his home. Cardiac Rehab referral difficult with pt having no insurance at this time. He is playing with his son outside and increasing daily activities. Encouraged to walk as much as possible.   Pt spoke with Lasandra Beech, SW re: Medicaid and disability application/status.  Pt did start Entresto (had supply at home) as directed last visit. Has one week supply left. [name], PharmD supplied pt with one month of samples. Pt refilled current meds with assistance of local charity.  Pt did stop daily Torsemide as instructed, took one dose 3 days ago for "fluid" and responded with "lots" of urine output.   Pt went to [facility] for INR check and was told "no orders". Instructed pt to call VAD pager if this happens in future for verbal/re-faxing of standing order.    Vital Signs:  Doppler Pressure: 120 Automatc BP: 117/37 (67); re-check 99/53 (68 HR: 106 SPO2: 99% on RA  Weight: 201.6 lb w/o eqt Last weight: 194.6lb Home weights: 198 - 204 lbs   VAD Indication: Destination Therapy   VAD interrogation & Equipment Management: Speed: 5600 Flow: 4.5 Power: 4.2 w    PI: 5.3 Hct: 34 Alarms: none  Events: 0 - 5 PI events  Fixed speed 5600 Low speed limit: 5300  Primary Controller:  Replace back up battery in 32 months. Back up controller:   Replace back up battery in 32 months.  Annual Equipment Maintenance on UBC/PM was performed on 4/19.   I reviewed the LVAD parameters from today and compared the results to the patient's prior recorded data. LVAD interrogation was NEGATIVE for significant power changes, NEGATIVE for clinical alarms and STABLE for PI events/speed drops. No programming changes were made and pump is functioning  within specified parameters. Pt is performing daily controller and system monitor self tests along with completing weekly and monthly maintenance for LVAD equipment.  LVAD equipment check completed and is in good working order. Back-up equipment present.   Exit Site Care: Drive line is being maintained daily  by [employer].  Existing VAD dressing removed and site care performed using sterile technique. Drive line exit site cleaned with Chlora prep applicators x 2, allowed to dry, and gauze dressing with silver strip re-applied. Drive line exit site well healing and partially incorporated. The velour is fully implanted at exit site. No erythema,  small amount of serous drainage. Stabilization device present and accurately applied. Pt denies fever or chills. Pt given 4 anchors and 4 daily kits. May advance to twice weekly dressing changes.   Anchor is "not staying in place". Pt wearing controller around neck which is pulling on anchor. Belt with controller holder given to patient and demonstrated how to use. Pt will change wearing controller and see if this is comfortable and helps with drive line stabilization.   Significant Events on VAD Support:    Device:none  BP & Labs:  Doppler 120 - reflecting modified systolic BP  Hgb 16.1 - No S/S of bleeding. Specifically denies melena/BRBPR or nosebleeds.  LDH stable at 196 with established baseline of 200-300. Denies tea-colored urine. No power elevations noted on interrogation.   Patient Instructions:  1.May advance dressing changes to twice weekly.  2. INR locally next week at [facility]  [facility]. Instructed pt to call VAD pager if no orders for verbal/re-faxing of standing order by VAD Coordinator.  3. Return to VAD clinic in 3 weeks.   Hessie Diener RN VAD Coordinator   Office: [phone] 24/7 Emergency VAD Pager: [phone]

## 2017-10-27 ENCOUNTER — Other Ambulatory Visit (HOSPITAL_COMMUNITY): Payer: Self-pay | Admitting: *Deleted

## 2017-10-27 DIAGNOSIS — R52 Pain, unspecified: Secondary | ICD-10-CM

## 2017-10-27 MED ORDER — TRAMADOL HCL 50 MG PO TABS: 50.0000 mg | ORAL_TABLET | ORAL | 0 refills | Status: DC | PRN

## 2017-11-07 ENCOUNTER — Encounter (HOSPITAL_COMMUNITY)
Admission: RE | Admit: 2017-11-07 | Discharge: 2017-11-07 | Disposition: A | Discharge status: Home / Self Care | Payer: Medicaid Other | Source: Ambulatory Visit | Attending: Internal Medicine | Admitting: Internal Medicine

## 2017-11-07 ENCOUNTER — Ambulatory Visit (HOSPITAL_COMMUNITY): Payer: Self-pay | Admitting: Pharmacist

## 2017-11-07 ENCOUNTER — Telehealth: Payer: Self-pay | Admitting: Licensed Clinical Social Worker

## 2017-11-07 DIAGNOSIS — Z95811 Presence of heart assist device: Secondary | ICD-10-CM | POA: Insufficient documentation

## 2017-11-07 LAB — PROTIME-INR
INR: 1.72
PROTHROMBIN TIME: 20 s — AB (ref 11.4–15.2)

## 2017-11-07 NOTE — Telephone Encounter (Signed)
CSW received a call from Hardie Lora from [facility] [phone] stating patient was approved for Deductible medicaid for the month of April and due to a glitch in the system waiting for further approval for 6 months. CSW provided with number for Social Security in [place] [phone] and will encourage patient to call to follow up on disability application. CSW continues to follow. Lasandra Beech, LCSW, CCSW-MCS [phone]

## 2017-11-08 ENCOUNTER — Telehealth: Payer: Self-pay | Admitting: Licensed Clinical Social Worker

## 2017-11-08 NOTE — Telephone Encounter (Signed)
CSW received call from patient's mother stating that the the Disability Determination worker stated she had no information regarding patient's hospitalization when LVAD was implanted. CSW informed that multiple calls have been made to DDS worker with messaged left and no response. All requests were faxed to DDS on 08/31/17 per their request. CSW again left message for DDS worker and continues to advocate for patient and await return call from DDS. Patient states he will continue to communicate with worker and return call to CSW as needed. Lasandra Beech, LCSW, CCSW-MCS [phone]

## 2017-11-15 ENCOUNTER — Encounter (HOSPITAL_COMMUNITY): Payer: Self-pay | Admitting: *Deleted

## 2017-11-15 ENCOUNTER — Ambulatory Visit (HOSPITAL_COMMUNITY): Payer: Self-pay | Admitting: Pharmacist

## 2017-11-15 ENCOUNTER — Encounter (HOSPITAL_COMMUNITY): Payer: Self-pay

## 2017-11-15 ENCOUNTER — Encounter (HOSPITAL_COMMUNITY): Payer: Self-pay | Admitting: Unknown Physician Specialty

## 2017-11-15 ENCOUNTER — Other Ambulatory Visit (HOSPITAL_COMMUNITY): Payer: Self-pay | Admitting: Unknown Physician Specialty

## 2017-11-15 ENCOUNTER — Ambulatory Visit (HOSPITAL_COMMUNITY)
Admission: RE | Admit: 2017-11-15 | Discharge: 2017-11-15 | Disposition: A | Discharge status: Home / Self Care | Payer: Medicaid Other | Source: Ambulatory Visit | Attending: Internal Medicine | Admitting: Internal Medicine

## 2017-11-15 VITALS — BP 92/0 | HR 85 | Ht 74.0 in | Wt 203.6 lb

## 2017-11-15 DIAGNOSIS — I429 Cardiomyopathy, unspecified: Secondary | ICD-10-CM | POA: Diagnosis present

## 2017-11-15 DIAGNOSIS — Z7982 Long term (current) use of aspirin: Secondary | ICD-10-CM | POA: Diagnosis not present

## 2017-11-15 DIAGNOSIS — Z7901 Long term (current) use of anticoagulants: Secondary | ICD-10-CM | POA: Diagnosis not present

## 2017-11-15 DIAGNOSIS — I5042 Chronic combined systolic (congestive) and diastolic (congestive) heart failure: Secondary | ICD-10-CM | POA: Diagnosis not present

## 2017-11-15 DIAGNOSIS — Z79899 Other long term (current) drug therapy: Secondary | ICD-10-CM | POA: Diagnosis not present

## 2017-11-15 DIAGNOSIS — Z95811 Presence of heart assist device: Secondary | ICD-10-CM | POA: Diagnosis not present

## 2017-11-15 DIAGNOSIS — I5022 Chronic systolic (congestive) heart failure: Secondary | ICD-10-CM | POA: Insufficient documentation

## 2017-11-15 DIAGNOSIS — F17210 Nicotine dependence, cigarettes, uncomplicated: Secondary | ICD-10-CM | POA: Diagnosis not present

## 2017-11-15 DIAGNOSIS — J45909 Unspecified asthma, uncomplicated: Secondary | ICD-10-CM | POA: Insufficient documentation

## 2017-11-15 DIAGNOSIS — F1010 Alcohol abuse, uncomplicated: Secondary | ICD-10-CM | POA: Diagnosis not present

## 2017-11-15 DIAGNOSIS — I110 Hypertensive heart disease with heart failure: Secondary | ICD-10-CM | POA: Diagnosis not present

## 2017-11-15 DIAGNOSIS — K449 Diaphragmatic hernia without obstruction or gangrene: Secondary | ICD-10-CM | POA: Insufficient documentation

## 2017-11-15 DIAGNOSIS — K219 Gastro-esophageal reflux disease without esophagitis: Secondary | ICD-10-CM | POA: Insufficient documentation

## 2017-11-15 DIAGNOSIS — I420 Dilated cardiomyopathy: Secondary | ICD-10-CM

## 2017-11-15 DIAGNOSIS — I5043 Acute on chronic combined systolic (congestive) and diastolic (congestive) heart failure: Secondary | ICD-10-CM | POA: Diagnosis not present

## 2017-11-15 DIAGNOSIS — D649 Anemia, unspecified: Secondary | ICD-10-CM | POA: Diagnosis not present

## 2017-11-15 DIAGNOSIS — F419 Anxiety disorder, unspecified: Secondary | ICD-10-CM

## 2017-11-15 DIAGNOSIS — E876 Hypokalemia: Secondary | ICD-10-CM | POA: Diagnosis not present

## 2017-11-15 LAB — BASIC METABOLIC PANEL
ANION GAP: 7 (ref 5–15)
BUN: 8 mg/dL (ref 6–20)
CHLORIDE: 108 mmol/L (ref 98–111)
CO2: 22 mmol/L (ref 22–32)
Calcium: 8.9 mg/dL (ref 8.9–10.3)
Creatinine, Ser: 1.01 mg/dL (ref 0.61–1.24)
GFR calc Af Amer: >60 mL/min (ref >60)
GLUCOSE: 177 mg/dL — AB (ref 70–99)
POTASSIUM: 4.2 mmol/L (ref 3.5–5.1)
Sodium: 137 mmol/L (ref 135–145)

## 2017-11-15 LAB — CBC
HCT: 38.4 % — ABNORMAL LOW (ref 39.0–52.0)
Hemoglobin: 11.3 g/dL — ABNORMAL LOW (ref 13.0–17.0)
MCH: 26.1 pg (ref 26.0–34.0)
MCHC: 29.4 g/dL — ABNORMAL LOW (ref 30.0–36.0)
MCV: 88.7 fL (ref 78.0–100.0)
PLATELETS: 181 10*3/uL (ref 150–400)
RBC: 4.33 MIL/uL (ref 4.22–5.81)
RDW: 16.3 % — ABNORMAL HIGH (ref 11.5–15.5)
WBC: 5.5 10*3/uL (ref 4.0–10.5)

## 2017-11-15 LAB — PROTIME-INR
INR: 1.81
PROTHROMBIN TIME: 20.9 s — AB (ref 11.4–15.2)

## 2017-11-15 LAB — LACTATE DEHYDROGENASE: LDH: 206 U/L — AB (ref 98–192)

## 2017-11-15 MED ORDER — SACUBITRIL-VALSARTAN 24-26 MG PO TABS
1.0000 | ORAL_TABLET | Freq: Two times a day (BID) | ORAL | 6 refills | Status: DC
Start: 2017-11-15 — End: 2017-12-15

## 2017-11-15 MED ORDER — AMIODARONE HCL 200 MG PO TABS
ORAL_TABLET | ORAL | 3 refills | Status: DC
Start: 2017-11-15 — End: 2018-04-19

## 2017-11-15 MED ORDER — GABAPENTIN 100 MG PO CAPS: 100.0000 mg | ORAL_CAPSULE | Freq: Three times a day (TID) | ORAL | 6 refills | Status: DC

## 2017-11-15 MED ORDER — WARFARIN SODIUM 5 MG PO TABS: 5.0000 mg | ORAL_TABLET | Freq: Every day | ORAL | 1 refills | Status: DC

## 2017-11-15 MED ORDER — SILDENAFIL CITRATE 20 MG PO TABS: 20.0000 mg | ORAL_TABLET | Freq: Three times a day (TID) | ORAL | 3 refills | Status: DC

## 2017-11-15 MED ORDER — CITALOPRAM HYDROBROMIDE 10 MG PO TABS: 10.0000 mg | ORAL_TABLET | Freq: Every day | ORAL | 3 refills | Status: DC

## 2017-11-15 MED ORDER — PANTOPRAZOLE SODIUM 40 MG PO TBEC: 40.0000 mg | DELAYED_RELEASE_TABLET | Freq: Every day | ORAL | 6 refills | Status: DC

## 2017-11-15 MED ORDER — ASPIRIN EC 81 MG PO TBEC: 81.0000 mg | DELAYED_RELEASE_TABLET | Freq: Every day | ORAL | 1 refills | Status: DC

## 2017-11-15 MED FILL — GABAPENTIN 100 MG CAP: 100 | 30 days supply | Qty: 90 | Fill #0

## 2017-11-15 MED FILL — PANTOPRAZOLE SOD DR 40 MG T: 40 | 30 days supply | Qty: 30 | Fill #0

## 2017-11-15 MED FILL — AMIODARONE HCL 200 MG TAB: 200 | 36 days supply | Qty: 36 | Fill #0

## 2017-11-15 MED FILL — CITALOPRAM HBR 10 MG TABLET: 10 | 30 days supply | Qty: 30 | Fill #0

## 2017-11-15 MED FILL — ASPIRIN ADULT LOW STRENGTH: 81 | 30 days supply | Qty: 30 | Fill #0

## 2017-11-15 MED FILL — WARFARIN SODIUM 5 MG TABLET: 5 | 40 days supply | Qty: 40 | Fill #0

## 2017-11-15 MED FILL — SILDENAFIL 20 MG TABLET: 20 | 30 days supply | Qty: 90 | Fill #0
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
## 2017-11-15 NOTE — Progress Notes (Signed)
Advanced Heart Failure VAD Clinic Note   Primary cardiologist: Harwani Primary HF: Dr. [name]  HPI:  Jonathan Wood is a 47 y.o. 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   Admitted 08/25/17 with ADHF and cardiogenic shock. Started on IV lasix and milrinone 0.125 mcg/kg/min with over 27 lbs of diuresis.   Echo showed EF 5-10%, with mild RV dilation, and mod to severe reduction in function.Case discussed with [facility] transplnt team but not candidate for transplant due to ongoing tobacco use.  Underwent HM3 LVAD 09/06/17 without any immediate post-op complications. 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  Started on neurontin for pocket pain. Progressed will with PT and thought stable for home on discharge.  Follow up for Heart Failure/LVAD:  He presents today for 3 week VAD follow up. Last visit no changes made. Thought to be stable on newly decreased torsemide with addition of Entresto. Feeling great overall. Has only needed torsemide once since last visit. Weight up 2 lbs, but feels like he is getting "healthier" and building muscle back up. He is feeling great. Doing all activities without problem. Even playing some basketball He denies any SOB, lightheadedness, or dizziness. No problems with ADLs or playing with his kids. He denies orthopnea, PND, BRBPR, melena, or neuro symptoms. No VAD alarms. Denies problems with his driveline. Taking all medication as directed. Has not smoked or drank any alcohol since prior to VAD. Says he has no desire for it and wants to live healthy.  VAD Indication: Destination Therapy -> Bridge to Transplant if can stop smoking.   VAD interrogation & Equipment Management: Speed: 5600 Flow: 4.8 Power: 4.3 w PI: 3.0  Alarms: None  Events: 0-5 events  Fixed speed 5600 Low speed limit: 5300  Primary Controller: Replace back up battery  in 31 months. Back up controller: Replace back up battery in 31months.  Annual Equipment Maintenance on UBC/PM was performed on 4/19.  Reports taking Coumadin as prescribed and adherence to anticoagulation based dietary restrictions.  Denies bright red blood per rectum or melena, no dark urine or hematuria.    I reviewed the LVAD parameters from today, and compared the results to the patient's prior recorded data.  No programming changes were made.  The LVAD is functioning within specified parameters.  The patient performs LVAD self-test daily.  LVAD interrogation was negative for any significant power changes, alarms or PI events/speed drops.  LVAD equipment check completed and is in good working order.  Back-up equipment present.   LVAD education done on emergency procedures and precautions and reviewed exit site care.   Past Medical History:  Diagnosis Date  . Asthma   . CHF (congestive heart failure) (HCC)    a. 09/2016: EF 20-25% with cath showing normal cors  . GERD (gastroesophageal reflux disease)   . History of hiatal hernia     Current Outpatient Medications  Medication Sig Dispense Refill  . amiodarone (PACERONE) 200 MG tablet Take 200 mg BID until 09/23/17, then decrease to 200 mg daily. (Patient taking differently: daily. Take 200 mg BID until 09/23/17, then decrease to 200 mg daily.) 36 tablet 3  . aspirin EC 81 MG tablet Take 81 mg by mouth daily.    . citalopram (CELEXA) 10 MG tablet Take 1 tablet (10 mg total) by mouth daily. 30 tablet 3  . gabapentin (NEURONTIN) 100 MG capsule Take 1  capsule (100 mg total) by mouth 3 (three) times daily. 90 capsule 6  . pantoprazole (PROTONIX) 40 MG tablet Take 1 tablet (40 mg total) by mouth daily. 30 tablet 6  . potassium chloride SA (KLOR-CON M20) 20 MEQ tablet Please take 40 meq twice today and then 40 meq daily starting tomorrow. (Patient taking differently: Take 20 mEq by mouth every other day. Please take 40 meq twice today and  then 40 meq daily starting tomorrow.) 90 tablet 3  . sacubitril-valsartan (ENTRESTO) 24-26 MG Take 1 tablet by mouth 2 (two) times daily. 60 tablet 6  . sildenafil (REVATIO) 20 MG tablet Take 1 tablet (20 mg total) by mouth 3 (three) times daily. 90 tablet 3  . torsemide (DEMADEX) 20 MG tablet May take an extra one if needed. 90 tablet 3  . warfarin (COUMADIN) 5 MG tablet Take 5 mg by mouth daily.     . traMADol (ULTRAM) 50 MG tablet Take 1-2 tablets (50-100 mg total) by mouth every 4 (four) hours as needed for moderate pain. Try tramadol for pain BEFORE using oxycodone. (Patient not taking: Reported on 11/15/2017) 60 tablet 0   No current facility-administered medications for this encounter.    Patient has no known allergies.  Review of systems complete and found to be negative unless listed in HPI.    Vitals:   11/15/17 1201 11/15/17 1202  BP: (!) 98/50 (!) 92/0  Pulse: 85   Weight: 203 lb 9.6 oz (92.4 kg)   Height: 6\' 2"  (1.88 m)      Wt Readings from Last 3 Encounters:  11/15/17 203 lb 9.6 oz (92.4 kg)  10/26/17 201 lb 9.6 oz (91.4 kg)  10/14/17 194 lb 9.6 oz (88.3 kg)    Vital Signs:  Doppler Pressure: 92 Automatc BP: 98/50 HR: 85 SPO2: 100  Weight: 203.6 Last weight: 201.6 lbs Home weights: ~200 lbs  Physical Exam: General:  NAD.  HEENT: normal  Neck: supple. JVP not elevated.  Carotids 2+ bilat; no bruits. No lymphadenopathy or thryomegaly appreciated. Cor: LVAD hum.  Lungs: Clear. Abdomen: obese soft, nontender, non-distended. No hepatosplenomegaly. No bruits or masses. Good bowel sounds. Driveline site clean. Anchor in place.  Extremities: no cyanosis, clubbing, rash. Warm no edema  Neuro: alert & oriented x 3. No focal deficits. Moves all 4 without problem    ASSESSMENT AND PLAN:   1. Chronic systolic HF - EF 29% s/p HM-3 LVAD on 09/06/17 - NYHA II symptoms - Volume status stable on exam.   - Continue torsemide as needed for weight gain 3 lbs overnight or  5 lbs within one week.  - Continue Entresto 24/26 mg BID. No room to increase.  - Will need referral to [facility] in 6 months to begin the transplant work-up. He has been off cigarettes since before VAD implant.  - Continue monthly cotinine checks.   2. HM3 LVAD 09/06/2017. - VAD interrogated personally. Parameters stable.   - Driveline site stable - LDH 206 - INR goal 2.0-2.5. INR 1.81. Discussed dosing with PharmD personally. - Continue ASA.   3. Hypokalemia - K 4.2. Stable.   4. Anxiety - Much improved on Celexa.  - No change to current plan.    5. Post-op anemia - Resolved.   Doing great overall. Continue current meds. Follow up 1 month.  Graciella Freer, PA-C  12:46 PM   Patient seen and examined with the above-signed Advanced Practice Provider and/or Housestaff. I personally reviewed laboratory data, imaging studies and  relevant notes. I independently examined the patient and formulated the important aspects of the plan. I have edited the note to reflect any of my changes or salient points. I have personally discussed the plan with the patient and/or family.  He is doing great. NYHA I. Volume status looks great off lasix. VAD interrogated personally. Parameters stable. INR 1.81. Discussed dosing with PharmD personally. Congratulated him on ongoing ETOH and tobacco cessation. Discussed transplant process at length. Will need to start doing monthly cotinine and ETOH screens to help him get listed.   Total time spent 35 minutes. Over half that time spent discussing above.   Arvilla Meres, MD  8:53 PM

## 2017-11-15 NOTE — Patient Instructions (Signed)
1. Continue dressing changes to twice weekly.  2. Take 1 and 1/2 tablets of Coumadin tonight and then resume a dose of 1 tablet daily except 1/2 tab on Fridays. Recheck INR locally on 7/12 at [facility]. Instructed pt to call VAD pager if no orders for verbal/re-faxing of standing order by VAD Coordinator.  3. Return to VAD clinic in 1 month.

## 2017-11-16 ENCOUNTER — Telehealth (HOSPITAL_COMMUNITY): Payer: Self-pay | Admitting: *Deleted

## 2017-11-16 NOTE — Telephone Encounter (Signed)
Medication Samples have been provided to the patient.  Drug name: Sherryll Burger       Strength: 24-26mg         Qty: 2    LOT: [id number]  Exp.Date: 6/21  Dosing instructions: Take 1 Tablet Twice Daily  The patient has been instructed regarding the correct time, dose, and frequency of taking this medication, including desired effects and most common side effects.   Georgina Peer 10:32 AM 11/16/2017

## 2017-11-17 ENCOUNTER — Encounter (HOSPITAL_COMMUNITY): Payer: Self-pay

## 2017-11-28 ENCOUNTER — Ambulatory Visit (HOSPITAL_COMMUNITY): Payer: Self-pay | Admitting: Pharmacist

## 2017-11-28 ENCOUNTER — Other Ambulatory Visit (HOSPITAL_COMMUNITY)
Admission: RE | Admit: 2017-11-28 | Discharge: 2017-11-28 | Disposition: A | Discharge status: Home / Self Care | Payer: Medicaid Other | Source: Ambulatory Visit | Attending: Internal Medicine | Admitting: Internal Medicine

## 2017-11-28 DIAGNOSIS — Z95811 Presence of heart assist device: Secondary | ICD-10-CM | POA: Insufficient documentation

## 2017-11-28 DIAGNOSIS — Z7901 Long term (current) use of anticoagulants: Secondary | ICD-10-CM | POA: Insufficient documentation

## 2017-11-28 LAB — PROTIME-INR
INR: 1.41
PROTHROMBIN TIME: 17.2 s — AB (ref 11.4–15.2)

## 2017-11-28 MED ORDER — ENOXAPARIN SODIUM 40 MG/0.4ML ~~LOC~~ SOLN: 40.0000 mg | Freq: Two times a day (BID) | SUBCUTANEOUS | 2 refills | Status: DC

## 2017-11-29 ENCOUNTER — Other Ambulatory Visit (HOSPITAL_COMMUNITY): Payer: Self-pay | Admitting: Unknown Physician Specialty

## 2017-12-02 ENCOUNTER — Encounter (HOSPITAL_COMMUNITY)
Admission: RE | Admit: 2017-12-02 | Discharge: 2017-12-02 | Disposition: A | Discharge status: Home / Self Care | Payer: Medicaid Other | Source: Ambulatory Visit | Attending: Internal Medicine | Admitting: Internal Medicine

## 2017-12-02 DIAGNOSIS — Z7901 Long term (current) use of anticoagulants: Secondary | ICD-10-CM | POA: Diagnosis not present

## 2017-12-02 DIAGNOSIS — Z95811 Presence of heart assist device: Secondary | ICD-10-CM | POA: Insufficient documentation

## 2017-12-02 LAB — PROTIME-INR
INR: 2.58
Prothrombin Time: 27.5 seconds — ABNORMAL HIGH (ref 11.4–15.2)

## 2017-12-05 ENCOUNTER — Ambulatory Visit (HOSPITAL_COMMUNITY): Payer: Self-pay | Admitting: Pharmacist

## 2017-12-05 DIAGNOSIS — Z95811 Presence of heart assist device: Secondary | ICD-10-CM

## 2017-12-12 ENCOUNTER — Encounter (HOSPITAL_COMMUNITY)
Admission: RE | Admit: 2017-12-12 | Discharge: 2017-12-12 | Disposition: A | Discharge status: Home / Self Care | Payer: Medicaid Other | Source: Ambulatory Visit | Attending: Internal Medicine | Admitting: Internal Medicine

## 2017-12-12 ENCOUNTER — Ambulatory Visit (HOSPITAL_COMMUNITY): Payer: Self-pay | Admitting: Pharmacist

## 2017-12-12 DIAGNOSIS — Z95811 Presence of heart assist device: Secondary | ICD-10-CM | POA: Diagnosis not present

## 2017-12-12 LAB — PROTIME-INR
INR: 2.28
PROTHROMBIN TIME: 24.9 s — AB (ref 11.4–15.2)

## 2017-12-14 ENCOUNTER — Other Ambulatory Visit (HOSPITAL_COMMUNITY): Payer: Self-pay | Admitting: *Deleted

## 2017-12-14 DIAGNOSIS — Z7901 Long term (current) use of anticoagulants: Secondary | ICD-10-CM

## 2017-12-14 DIAGNOSIS — Z95811 Presence of heart assist device: Secondary | ICD-10-CM

## 2017-12-15 ENCOUNTER — Ambulatory Visit (HOSPITAL_COMMUNITY)
Admission: RE | Admit: 2017-12-15 | Discharge: 2017-12-15 | Disposition: A | Discharge status: Home / Self Care | Payer: Medicaid Other | Source: Ambulatory Visit | Attending: Internal Medicine | Admitting: Internal Medicine

## 2017-12-15 ENCOUNTER — Encounter (HOSPITAL_COMMUNITY): Payer: Self-pay

## 2017-12-15 ENCOUNTER — Ambulatory Visit (HOSPITAL_COMMUNITY): Payer: Self-pay | Admitting: Pharmacist

## 2017-12-15 VITALS — BP 120/85 | HR 85 | Ht 74.0 in | Wt 208.4 lb

## 2017-12-15 DIAGNOSIS — F419 Anxiety disorder, unspecified: Secondary | ICD-10-CM | POA: Insufficient documentation

## 2017-12-15 DIAGNOSIS — J45909 Unspecified asthma, uncomplicated: Secondary | ICD-10-CM | POA: Insufficient documentation

## 2017-12-15 DIAGNOSIS — Z7901 Long term (current) use of anticoagulants: Secondary | ICD-10-CM | POA: Diagnosis not present

## 2017-12-15 DIAGNOSIS — Z95811 Presence of heart assist device: Secondary | ICD-10-CM

## 2017-12-15 DIAGNOSIS — Z7982 Long term (current) use of aspirin: Secondary | ICD-10-CM | POA: Insufficient documentation

## 2017-12-15 DIAGNOSIS — K219 Gastro-esophageal reflux disease without esophagitis: Secondary | ICD-10-CM | POA: Insufficient documentation

## 2017-12-15 DIAGNOSIS — Z79891 Long term (current) use of opiate analgesic: Secondary | ICD-10-CM | POA: Insufficient documentation

## 2017-12-15 DIAGNOSIS — Z87891 Personal history of nicotine dependence: Secondary | ICD-10-CM | POA: Diagnosis not present

## 2017-12-15 DIAGNOSIS — E876 Hypokalemia: Secondary | ICD-10-CM | POA: Insufficient documentation

## 2017-12-15 DIAGNOSIS — Z79899 Other long term (current) drug therapy: Secondary | ICD-10-CM | POA: Insufficient documentation

## 2017-12-15 DIAGNOSIS — I10 Essential (primary) hypertension: Secondary | ICD-10-CM

## 2017-12-15 DIAGNOSIS — I5022 Chronic systolic (congestive) heart failure: Secondary | ICD-10-CM | POA: Insufficient documentation

## 2017-12-15 DIAGNOSIS — I5043 Acute on chronic combined systolic (congestive) and diastolic (congestive) heart failure: Secondary | ICD-10-CM

## 2017-12-15 DIAGNOSIS — I110 Hypertensive heart disease with heart failure: Secondary | ICD-10-CM | POA: Insufficient documentation

## 2017-12-15 LAB — CBC
HCT: 38 % — ABNORMAL LOW (ref 39.0–52.0)
HEMOGLOBIN: 11.6 g/dL — AB (ref 13.0–17.0)
MCH: 26.8 pg (ref 26.0–34.0)
MCHC: 30.5 g/dL (ref 30.0–36.0)
MCV: 87.8 fL (ref 78.0–100.0)
PLATELETS: 182 10*3/uL (ref 150–400)
RBC: 4.33 MIL/uL (ref 4.22–5.81)
RDW: 16.7 % — ABNORMAL HIGH (ref 11.5–15.5)
WBC: 6.6 10*3/uL (ref 4.0–10.5)

## 2017-12-15 LAB — BASIC METABOLIC PANEL
Anion gap: 8 (ref 5–15)
BUN: 7 mg/dL (ref 6–20)
CHLORIDE: 109 mmol/L (ref 98–111)
CO2: 22 mmol/L (ref 22–32)
CREATININE: 1.08 mg/dL (ref 0.61–1.24)
Calcium: 8.7 mg/dL — ABNORMAL LOW (ref 8.9–10.3)
GFR calc Af Amer: >60 mL/min (ref >60)
GLUCOSE: 158 mg/dL — AB (ref 70–99)
POTASSIUM: 3.8 mmol/L (ref 3.5–5.1)
SODIUM: 139 mmol/L (ref 135–145)

## 2017-12-15 LAB — PROTIME-INR
INR: 2.36
PROTHROMBIN TIME: 25.6 s — AB (ref 11.4–15.2)

## 2017-12-15 LAB — LACTATE DEHYDROGENASE: LDH: 227 U/L — ABNORMAL HIGH (ref 98–192)

## 2017-12-15 MED ORDER — SACUBITRIL-VALSARTAN 49-51 MG PO TABS: 1.0000 | ORAL_TABLET | Freq: Two times a day (BID) | ORAL | 6 refills | Status: DC

## 2017-12-15 NOTE — Progress Notes (Signed)
Patient presents for one month follow up in VAD Clinic today with daughter. Reports no problems with VAD equipment or concerns with drive line.  Pt spoke with Lasandra Beech, LCSW re: disability.   He asked if he can start Cardiac Rehab now that his Medicaid has been approved. Contacted [facility] asking them to call patient.   Vital Signs:  Doppler Pressure:  100 Automatc BP: 120/85 (99) HR:  85 SPO2: 97%   Weight:  208.4 lb w/o eqt Last weight:  203.6 lb   VAD Indication: Destination Therapy   VAD interrogation & Equipment Management: Speed: 5600 Flow: 4.5 Power: 4.2w    PI: 3.9 Hct: 38 Alarms:  Low voltage advisories Events: 20 - 30 PI events  Fixed speed 5600 Low speed limit: 5300  Primary Controller:  Replace back up battery in 30 months. Back up controller:   Replace back up battery in 30 months.  Annual Equipment Maintenance on UBC/PM was performed on 4/19.   I reviewed the LVAD parameters from today and compared the results to the patient's prior recorded data. LVAD interrogation was NEGATIVE for significant power changes, NEGATIVE for clinical alarms and STABLE for PI events/speed drops. No programming changes were made and pump is functioning within specified parameters. Pt is performing daily controller and system monitor self tests along with completing weekly and monthly maintenance for LVAD equipment.  LVAD equipment check completed and is in good working order. Back-up equipment present.   Exit Site Care: Drive line is being maintaine twice weekly to weekly by [employer].  Existing VAD dressing removed and site care performed using sterile technique. Drive line exit site cleaned with Chlora prep applicators x 2, allowed to dry, and sorbaview dressing with bio patch re-applied. Drive line exit site incorporated, the velour is fully implanted at exit site. No erythema,  scant amount of serous drainage. Stabilization device present and accurately  applied. Pt denies fever or chills. Pt given 6 dressing kits and 6 anchors. Will advance to weekly dressing changes.   Significant Events on VAD Support:   Device:none  BP & Labs:  Doppler 100 - reflecting MAP  Hgb 11.6 - No S/S of bleeding. Specifically denies melena/BRBPR or nosebleeds.  LDH stable at 227 with established baseline of 200-300. Denies tea-colored urine. No power elevations noted on interrogation.  3 mo Intermacs follow up completed including:  Quality of Life, KCCQ-12, and Neurocognitive trail making.   Pt completed 1400 feet during 6 minute walk.  Patient Instructions:  1. Stop torsemide that you have been taking as needed 2 Increase the Entresto to 49/21 twice daily. May take two of the pills you have now twice daily. Then we will give you new strength tabs.  3.Advance to weekly dressing changes.  4.Re-check labs in 2 weeks at [facility] on December 29, 2017. 5. Will contact [facility] to call you. 6. Return to VAD clinic in 6 weeks.    Hessie Diener RN VAD Coordinator   Office: [phone] 24/7 Emergency VAD Pager: [phone]

## 2017-12-15 NOTE — Patient Instructions (Signed)
1. Stop torsemide that you have been taking as needed 2 Increase the Entresto to 49/21 twice daily. May take two of the pills you have now twice daily. Then we will give you new strength tabs.  3.Advance to weekly dressing changes.  4.Re-check labs in 2 weeks at [facility] on December 29, 2017. 5. Will contact [facility] to call you. 6. Return to VAD clinic in 6 weeks.

## 2017-12-15 NOTE — Progress Notes (Signed)
Advanced Heart Failure VAD Clinic Note   Primary cardiologist: Jonathan Wood Primary HF: Dr. Gala Wood  HPI:  Jonathan Wood is a 47 y.o. 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   Admitted 08/25/17 with ADHF and cardiogenic shock. Started on IV lasix and milrinone 0.125 mcg/kg/min with over 27 lbs of diuresis.   Echo showed EF 5-10%, with mild RV dilation, and mod to severe reduction in function.Case discussed with [facility] transplnt team but not candidate for transplant due to ongoing tobacco use.  Underwent HM3 LVAD 09/06/17 without any immediate post-op complications. 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  Started on neurontin for pocket pain. Progressed will with PT and thought stable for home on discharge.  Follow up for Heart Failure/LVAD:  He presents today for 1 month follow up. He is doing well overall.  Denies lightheadedness or dizziness. Remains SOB with moderate exertion. He was able to mow 3 "lines" of his yard earlier this week prior to having to have his son take over. He continues to feel stronger. No SOB with ADLs or playing with his kids.  He denies orthopnea, PND, BRBPR, melena, or neuro symptoms. No VAD alarms.  Denies problems with his driveline. Has only needed torsemide twice in past month since follow up. Remains abstinent from substance abuse. 1 low voltage alarm 12/13/17. [place] his kids seeing the [facility] and his batteries ran low. Remains very interested in transplant.   VAD Indication: Destination Therapy -> Bridge to Transplant if can stop smoking.   VAD interrogation & Equipment Management: Speed: 5600 Flow: 4.5 Power: 4.2 PI: 3.9  Alarms: 1 Low voltage Events: 20-30 events  Fixed speed: 5600 Low speed limit: 5300  Primary Controller: Replace back up battery in 30 months. Back up controller: Replace back up battery  in 30months.  Annual Equipment Maintenance on UBC/PM was performed on 4/19.  Reports taking Coumadin as prescribed and adherence to anticoagulation based dietary restrictions.  Denies bright red blood per rectum or melena, no dark urine or hematuria.     I reviewed the LVAD parameters from today, and compared the results to the patient's prior recorded data.  No programming changes were made.  The LVAD is functioning within specified parameters.  The patient performs LVAD self-test daily.  LVAD interrogation was negative for any significant power changes, alarms or PI events/speed drops.  LVAD equipment check completed and is in good working order.  Back-up equipment present.   LVAD education done on emergency procedures and precautions and reviewed exit site care.    Past Medical History:  Diagnosis Date  . Asthma   . CHF (congestive heart failure) (HCC)    a. 09/2016: EF 20-25% with cath showing normal cors  . GERD (gastroesophageal reflux disease)   . History of hiatal hernia     Current Outpatient Medications  Medication Sig Dispense Refill  . amiodarone (PACERONE) 200 MG tablet 200 mg daily 36 tablet 3  . aspirin EC 81 MG tablet Take 1 tablet (81 mg total) by mouth daily. 30 tablet 1  . citalopram (CELEXA) 10 MG tablet Take 1 tablet (10 mg total) by mouth daily. 30 tablet 3  . enoxaparin (LOVENOX) 40 MG/0.4ML injection Inject 0.4 mLs (40 mg total) into the skin every 12 (twelve) hours. 5 Syringe 2  . gabapentin (NEURONTIN) 100 MG capsule Take 1 capsule (  100 mg total) by mouth 3 (three) times daily. 90 capsule 6  . pantoprazole (PROTONIX) 40 MG tablet Take 1 tablet (40 mg total) by mouth daily. 30 tablet 6  . potassium chloride SA (KLOR-CON M20) 20 MEQ tablet Please take 40 meq twice today and then 40 meq daily starting tomorrow. (Patient taking differently: Take 20 mEq by mouth every other day. Please take 40 meq twice today and then 40 meq daily starting tomorrow.) 90 tablet 3  .  sacubitril-valsartan (ENTRESTO) 24-26 MG Take 1 tablet by mouth 2 (two) times daily. 60 tablet 6  . sildenafil (REVATIO) 20 MG tablet Take 1 tablet (20 mg total) by mouth 3 (three) times daily. 90 tablet 3  . torsemide (DEMADEX) 20 MG tablet May take an extra one if needed. 90 tablet 3  . traMADol (ULTRAM) 50 MG tablet Take 1-2 tablets (50-100 mg total) by mouth every 4 (four) hours as needed for moderate pain. Try tramadol for pain BEFORE using oxycodone. (Patient not taking: Reported on 11/15/2017) 60 tablet 0  . warfarin (COUMADIN) 5 MG tablet Take 1 tablet (5 mg total) by mouth daily. 40 tablet 1   No current facility-administered medications for this visit.    Patient has no known allergies.  Review of systems complete and found to be negative unless listed in HPI.    Vitals:   12/15/17 1123 12/15/17 1124  BP: (!) 100/0 120/85  Pulse:  85  SpO2:  97%  Weight:  208 lb 6.4 oz (94.5 kg)  Height:  6\' 2"  (1.88 m)     Wt Readings from Last 3 Encounters:  12/15/17 208 lb 6.4 oz (94.5 kg)  11/15/17 203 lb 9.6 oz (92.4 kg)  10/26/17 201 lb 9.6 oz (91.4 kg)    Vital Signs:  Doppler Pressure: 100 Automatc BP: 120/85 (99) HR: 85 SPO2: 97%  Weight: 208 lbs Last weight: 203.6 lbs Home weights: ~200 lbs  Physical Exam: General:  NAD.  HEENT: normal  Neck: supple. JVP not elevated.  Carotids 2+ bilat; no bruits. No lymphadenopathy or thryomegaly appreciated. Cor: LVAD hum.  Lungs: Clear. Abdomen:soft, nontender, non-distended. No hepatosplenomegaly. No bruits or masses. Good bowel sounds. Driveline site clean. Anchor in place.  Extremities: no cyanosis, clubbing, rash. Warm no edema  Neuro: alert & oriented x 3. No focal deficits. Moves all 4 without problem    ASSESSMENT AND PLAN:   1. Chronic systolic HF - EF 13% s/p HM-3 LVAD on 09/06/17 - Continues to do very well. NYHA I-early II symptoms - Volume status stable on exam.   - Continue torsemide as needed for weight gain 3  lbs overnight or 5 lbs within one week.  - Increase Entresto 49/51 mg BID.   - Will need referral to [facility] in 6 months to begin the transplant work-up. He has been off cigarettes since before VAD implant.  - Continue monthly cotinine checks.   - Reinforced fluid restriction to < 2 L daily, sodium restriction to less than 2000 mg daily, and the importance of daily weights.    2. HM3 LVAD 09/06/2017. - VAD interrogated personally. Parameters stable.   - Driveline site stable.  - LDH 227 - INR goal 2.0-2.5. INR 2.36. Discussed dosing with PharmD personally.  - Continue ASA.  - MAPs running higher. Increase Entresto  3. Hypokalemia - K 3.8. Follow.   4. Anxiety - Much improved on Celexa.  - No change to current plan.    Graciella Freer, PA-C  9:46 AM   Patient seen and examined with the above-signed Advanced Practice Provider and/or Housestaff. I personally reviewed laboratory data, imaging studies and relevant notes. I independently examined the patient and formulated the important aspects of the plan. I have edited the note to reflect any of my changes or salient points. I have personally discussed the plan with the patient and/or family.  Overall continues to do very well. NYHA I-early II with VAD support. Volume status looks good. MAPs remain slightly elevated. Will increase Entresto to 49/51 bid. INR 2.36. Discussed dosing with PharmD personally. LDH ok. VAD interrogated personally. Parameters stable. Driveline ok. He remains abstinent from tobacco, ETOH and other substances, Very committed to his health. Again discussed transplant referral process with him.   Total time personally spent 35 minutes. Over half that time spent discussing above.   Arvilla Meres, MD  1:36 PM

## 2017-12-22 ENCOUNTER — Telehealth: Payer: Self-pay | Admitting: Licensed Clinical Social Worker

## 2017-12-22 NOTE — Telephone Encounter (Signed)
CSW received call from patient stating that he received a call from [facility] stating everything is in except for records from Dr. Roseanne Kaufman office. Patient requesting assistant from CSW to contact Kadakia's office to explain and request records. CSW spoke with office staff and they will look into request and return call to CSW. Lasandra Beech, LCSW, CCSW-MCS [phone]

## 2017-12-26 MED FILL — CITALOPRAM HBR 10 MG TABLET: 10 | 30 days supply | Qty: 30 | Fill #1

## 2017-12-26 MED FILL — PANTOPRAZOLE SOD DR 40 MG T: 40 | 30 days supply | Qty: 30 | Fill #1

## 2017-12-26 MED FILL — SILDENAFIL 20 MG TABLET: 20 | 30 days supply | Qty: 90 | Fill #1

## 2017-12-26 MED FILL — AMIODARONE HCL 200 MG TAB: 200 | 30 days supply | Qty: 30 | Fill #1

## 2017-12-27 ENCOUNTER — Ambulatory Visit (HOSPITAL_COMMUNITY)
Admission: RE | Admit: 2017-12-27 | Discharge: 2017-12-27 | Disposition: A | Discharge status: Home / Self Care | Payer: Medicaid Other | Source: Ambulatory Visit | Attending: Internal Medicine | Admitting: Internal Medicine

## 2017-12-27 ENCOUNTER — Other Ambulatory Visit (HOSPITAL_COMMUNITY): Payer: Self-pay | Admitting: *Deleted

## 2017-12-27 ENCOUNTER — Ambulatory Visit (HOSPITAL_COMMUNITY): Payer: Self-pay | Admitting: Pharmacist

## 2017-12-27 DIAGNOSIS — Z7901 Long term (current) use of anticoagulants: Secondary | ICD-10-CM | POA: Insufficient documentation

## 2017-12-27 DIAGNOSIS — Z95811 Presence of heart assist device: Secondary | ICD-10-CM

## 2017-12-27 DIAGNOSIS — G629 Polyneuropathy, unspecified: Secondary | ICD-10-CM

## 2017-12-27 DIAGNOSIS — R52 Pain, unspecified: Secondary | ICD-10-CM

## 2017-12-27 LAB — PROTIME-INR
INR: 1.29
Prothrombin Time: 16 seconds — ABNORMAL HIGH (ref 11.4–15.2)

## 2017-12-27 MED ORDER — GABAPENTIN 300 MG PO CAPS: 300.0000 mg | ORAL_CAPSULE | Freq: Three times a day (TID) | ORAL | 6 refills | Status: DC

## 2017-12-27 MED ORDER — TRAMADOL HCL 50 MG PO TABS: 50.0000 mg | ORAL_TABLET | ORAL | 0 refills | Status: DC | PRN

## 2017-12-27 MED ORDER — ENOXAPARIN SODIUM 40 MG/0.4ML ~~LOC~~ SOLN: 40.0000 mg | Freq: Two times a day (BID) | SUBCUTANEOUS | 2 refills | Status: DC

## 2017-12-27 MED FILL — GABAPENTIN 300 MG CAPSULE: 300 | 30 days supply | Qty: 90 | Fill #0

## 2017-12-27 MED FILL — traMADol HCL 50 MG TABS: 50 | 5 days supply | Qty: 60 | Fill #0

## 2017-12-28 NOTE — Progress Notes (Signed)
Pt presented to VAD clinic stating his drive line dressing was "wet" and his daughter is out of town and unable to change. We advanced to weekly dressing last clinic visit (12/15/17). Dressing is partially off, no anchor in place. Pt is requesting to "go back to other kind of dressing". Pt is not taking showers yet, but does report going outside and playing football with is son. Since weather has been so hot, his dressing gets wet with sweat.   Existing VAD dressing removed and site care performed using sterile technique. Drive line exit site cleaned with Chlora prep applicators x 2, allowed to dry, and gauze dressing with aquacel silver strips applied. Exit site healed and incorporated, the velour is fully implanted at exit site. Small amount yellowish drainage with no foul odor; no redness, tenderness, or rash noted. Drive line anchor re-applied. Pt denies fever or chills. Pt provided with daily dressing kits and silver strips. Instructed to go back to twice week dressings, or more often if dressing becomes wet. Pt verbalized understanding of same.  Pt says he is supposed to get INR checked Friday of this week; he actually was supposed to have check yesterday, will add to this visit. Cicero Duck, PharmD will contact him with results and coumadin dosing instructions. Pt verbalized understanding of same.   Hessie Diener RN, VAD Coordinator [phone] 24/7 VAD pager: [phone]

## 2017-12-28 NOTE — Addendum Note (Signed)
Encounter addended by: Levonne Spiller, RN on: 12/28/2017 10:46 AM  Actions taken: Pend clinical note

## 2017-12-28 NOTE — Addendum Note (Signed)
Encounter addended by: Levonne Spiller, RN on: 12/28/2017 11:04 AM  Actions taken: Sign clinical note

## 2017-12-30 ENCOUNTER — Other Ambulatory Visit (HOSPITAL_COMMUNITY)
Admission: RE | Admit: 2017-12-30 | Discharge: 2017-12-30 | Disposition: A | Discharge status: Home / Self Care | Payer: Medicaid Other | Source: Ambulatory Visit | Attending: Internal Medicine | Admitting: Internal Medicine

## 2017-12-30 ENCOUNTER — Ambulatory Visit (HOSPITAL_COMMUNITY): Payer: Self-pay | Admitting: Pharmacist

## 2017-12-30 DIAGNOSIS — Z95811 Presence of heart assist device: Secondary | ICD-10-CM | POA: Insufficient documentation

## 2017-12-30 DIAGNOSIS — Z7901 Long term (current) use of anticoagulants: Secondary | ICD-10-CM | POA: Insufficient documentation

## 2017-12-30 LAB — PROTIME-INR
INR: 2.2
Prothrombin Time: 24.3 seconds — ABNORMAL HIGH (ref 11.4–15.2)

## 2018-01-06 ENCOUNTER — Ambulatory Visit (HOSPITAL_COMMUNITY): Payer: Self-pay | Admitting: Pharmacist

## 2018-01-06 ENCOUNTER — Other Ambulatory Visit (HOSPITAL_COMMUNITY)
Admission: RE | Admit: 2018-01-06 | Discharge: 2018-01-06 | Disposition: A | Discharge status: Home / Self Care | Payer: Medicaid Other | Source: Ambulatory Visit | Attending: Internal Medicine | Admitting: Internal Medicine

## 2018-01-06 DIAGNOSIS — Z95811 Presence of heart assist device: Secondary | ICD-10-CM | POA: Diagnosis present

## 2018-01-06 LAB — PROTIME-INR
INR: 2.39
Prothrombin Time: 25.9 seconds — ABNORMAL HIGH (ref 11.4–15.2)

## 2018-01-17 ENCOUNTER — Telehealth: Payer: Self-pay | Admitting: Licensed Clinical Social Worker

## 2018-01-17 NOTE — Telephone Encounter (Signed)
Patient contacted CSW to request assistance with payment of water and [facility] energy bills. CSW assisted with payment through the Patient Assistance Fund. Patient grateful for assistance. CSW will continue to follow through VAD clinic as needed. Lasandra Beech, LCSW, CCSW-MCS [phone]

## 2018-01-20 ENCOUNTER — Telehealth (HOSPITAL_COMMUNITY): Payer: Self-pay | Admitting: Pharmacist

## 2018-01-20 NOTE — Telephone Encounter (Signed)
Entresto PA approved by [place] [employer] through 12/23/18.  Tyler Deis. Bonnye Fava, PharmD, BCPS, CPP Clinical Pharmacist Phone: [phone] 01/20/2018 9:55 AM

## 2018-01-25 ENCOUNTER — Ambulatory Visit (HOSPITAL_COMMUNITY): Payer: Self-pay | Admitting: Pharmacist

## 2018-01-25 ENCOUNTER — Other Ambulatory Visit (HOSPITAL_COMMUNITY): Payer: Self-pay | Admitting: *Deleted

## 2018-01-25 ENCOUNTER — Ambulatory Visit (HOSPITAL_COMMUNITY)
Admission: RE | Admit: 2018-01-25 | Discharge: 2018-01-25 | Disposition: A | Discharge status: Home / Self Care | Payer: Medicaid Other | Source: Ambulatory Visit | Attending: Cardiology | Admitting: Cardiology

## 2018-01-25 ENCOUNTER — Encounter (HOSPITAL_COMMUNITY): Payer: Self-pay

## 2018-01-25 ENCOUNTER — Telehealth (HOSPITAL_COMMUNITY): Payer: Self-pay | Admitting: *Deleted

## 2018-01-25 VITALS — BP 94/72 | HR 82 | Ht 74.0 in | Wt 212.6 lb

## 2018-01-25 DIAGNOSIS — Z79899 Other long term (current) drug therapy: Secondary | ICD-10-CM | POA: Insufficient documentation

## 2018-01-25 DIAGNOSIS — F419 Anxiety disorder, unspecified: Secondary | ICD-10-CM | POA: Diagnosis not present

## 2018-01-25 DIAGNOSIS — J45909 Unspecified asthma, uncomplicated: Secondary | ICD-10-CM | POA: Insufficient documentation

## 2018-01-25 DIAGNOSIS — E876 Hypokalemia: Secondary | ICD-10-CM | POA: Insufficient documentation

## 2018-01-25 DIAGNOSIS — I5022 Chronic systolic (congestive) heart failure: Secondary | ICD-10-CM

## 2018-01-25 DIAGNOSIS — I5043 Acute on chronic combined systolic (congestive) and diastolic (congestive) heart failure: Secondary | ICD-10-CM | POA: Diagnosis not present

## 2018-01-25 DIAGNOSIS — I110 Hypertensive heart disease with heart failure: Secondary | ICD-10-CM | POA: Insufficient documentation

## 2018-01-25 DIAGNOSIS — K449 Diaphragmatic hernia without obstruction or gangrene: Secondary | ICD-10-CM | POA: Diagnosis not present

## 2018-01-25 DIAGNOSIS — Z95811 Presence of heart assist device: Secondary | ICD-10-CM

## 2018-01-25 DIAGNOSIS — Z7901 Long term (current) use of anticoagulants: Secondary | ICD-10-CM

## 2018-01-25 DIAGNOSIS — K219 Gastro-esophageal reflux disease without esophagitis: Secondary | ICD-10-CM | POA: Diagnosis not present

## 2018-01-25 DIAGNOSIS — F1011 Alcohol abuse, in remission: Secondary | ICD-10-CM | POA: Insufficient documentation

## 2018-01-25 DIAGNOSIS — I10 Essential (primary) hypertension: Secondary | ICD-10-CM

## 2018-01-25 DIAGNOSIS — Z7982 Long term (current) use of aspirin: Secondary | ICD-10-CM | POA: Insufficient documentation

## 2018-01-25 LAB — LACTATE DEHYDROGENASE: LDH: 232 U/L — AB (ref 98–192)

## 2018-01-25 LAB — BASIC METABOLIC PANEL
Anion gap: 9 (ref 5–15)
BUN: 9 mg/dL (ref 6–20)
CO2: 22 mmol/L (ref 22–32)
CREATININE: 1.04 mg/dL (ref 0.61–1.24)
Calcium: 9.1 mg/dL (ref 8.9–10.3)
Chloride: 108 mmol/L (ref 98–111)
GFR calc non Af Amer: >60 mL/min (ref >60)
GLUCOSE: 169 mg/dL — AB (ref 70–99)
Potassium: 4.7 mmol/L (ref 3.5–5.1)
Sodium: 139 mmol/L (ref 135–145)

## 2018-01-25 LAB — CBC
HCT: 41 % (ref 39.0–52.0)
Hemoglobin: 12.1 g/dL — ABNORMAL LOW (ref 13.0–17.0)
MCH: 27.6 pg (ref 26.0–34.0)
MCHC: 29.5 g/dL — AB (ref 30.0–36.0)
MCV: 93.4 fL (ref 78.0–100.0)
PLATELETS: 172 10*3/uL (ref 150–400)
RBC: 4.39 MIL/uL (ref 4.22–5.81)
RDW: 16.3 % — AB (ref 11.5–15.5)
WBC: 6 10*3/uL (ref 4.0–10.5)

## 2018-01-25 LAB — PROTIME-INR
INR: 1.61
PROTHROMBIN TIME: 19 s — AB (ref 11.4–15.2)

## 2018-01-25 NOTE — Progress Notes (Signed)
CSW met with patient in the clinic. Patient reports continued process with disability and spoke with worker yesterday who stated additional records are still needed to make determination. CSW will attempt to contact and advocate on patient's behalf. Patient also in need of minutes for his phone to make follow up calls and have access to a phone line for emergencies. CSW provided [employer] gift card to obtain minutes. Patient grateful for assistance. CSW will follow up with [facility] and any other needs as identified by patient/staff. [name], [place], [place]

## 2018-01-25 NOTE — Telephone Encounter (Deleted)
Pt called VAD pager to report pump stop when connecting to power module during the night, he placed himself back on batteries and alarm stopped and has had no further alarms.  Due to his short to shield DL condition, with pump stop on ungrounded cable/power module at home (pt did confirm he has the power module with ungrounded cable) there is a possibility pt's lead has progressed to phase to phase fracture. Instructed pt to come to clinic immediately for evaluation of alarms/driveline. Pt refused, saying he has a court appearance scheduled at 9:00 am this am that he cannot miss. Explained if above lead fracture has occurred, he could have pump stop at any time EVEN on batteries. Lasandra Beech, LSW spoke with patient re: child care, court re-scheduling, etc. Pt continues to refuse. Says he will come to clinic as soon as he is out of court.   Advised pt if any further pump stops or VAD alarms, he is to call 911 and come directly to [facility] ED.  Notified [facility] per Dr. [name] re: above issue. [facility] will have two engineers here tomorrow at 1:00 pm for lead re-build. Notifed patient placement and ask for 2H ICU bed. Tawanna Sat, 2H charge.   Awaiting patient arrival.  Hessie Diener RN, VAD Coordinator [phone]

## 2018-01-25 NOTE — Progress Notes (Signed)
Patient presents for one month follow up in VAD Clinic today. Reports no problems with VAD equipment or concerns with drive line since switching back to daily dressing kits and changing twice weekly.  Pt did stop torsemide as instructed last clinic visit and increased Entresto to 49/21 bid. He denies any heart failure symptoms. Says he feels "the best I've felt in years".  Reports he will be starting Cardiac Rehab at [place] next week on 02/03/18.  Dr. Bensimhon explained need for six consecutive negative nicotine levels in order to be considered for transplant. Pt reports he is "around second hand smoke" and wants to wait until next visit to start serial nicotine testing.   Pt spoke with Jackie Brennan, LCSW re: disability.  Vital Signs:  Doppler Pressure:  94 Automatc BP: 94/72 (81) HR:  82 SPO2: 98% on RA  Weight: 212.6 lbs lb w/o eqt Last weight: 208.4 lb   VAD Indication: Destination Therapy   VAD interrogation & Equipment Management: Speed: 5600 Flow: 4.7 Power: 4.2w    PI: 3.1 Hct: 38 Alarms:  None  Events: 10 - 20 PI events  Fixed speed 5600 Low speed limit: 5300  Primary Controller:  Replace back up battery in 29 months. Back up controller:   Replace back up battery in 29 months.  Annual Equipment Maintenance on [facility]/PM was performed on 4/19.   I reviewed the LVAD parameters from today and compared the results to the patient's prior recorded data. LVAD interrogation was NEGATIVE for significant power changes, NEGATIVE for clinical alarms and STABLE for PI events/speed drops. No programming changes were made and pump is functioning within specified parameters. Pt is performing daily controller and system monitor self tests along with completing weekly and monthly maintenance for LVAD equipment.  LVAD equipment check completed and is in good working order. Back-up equipment present.   Exit Site Care: Drive line is being maintaine twice weekly to weekly by  Alice.  Existing VAD dressing removed and site care performed using sterile technique. Drive line exit site cleaned with Chlora prep applicators x 2, allowed to dry, and gauze dressing with silver strip re-applied. Drive line exit site incorporated, the velour is fully implanted at exit site. No erythema,  scant amount of serous drainage. Stabilization device present and accurately applied. Pt denies fever or chills. Pt given 10 daily dressing kits and 6 anchors. Will continue twice weekly dressing changes using daily kit until patient is no longer outside in the heat and sweating.   Significant Events on VAD Support:   Device:none  BP & Labs:  Doppler 94 - reflecting modified systolic  Hgb 12.1 - No S/S of bleeding. Specifically denies melena/BRBPR or nosebleeds.  LDH stable at 232 with established baseline of 200-300. Denies tea-colored urine. No power elevations noted on interrogation.   Patient Instructions:  1. No change in meds. 2. Erika, PharmD will call you with INR results and coumadin dosing. 3. Will plan on starting serial nicotine testing next visit or dan order at  lab if you would like to start sooner.  4. Return to VAD clinic in 2 months.    Molly Reece RN VAD Coordinator   Office: [phone] 24/7 Emergency VAD Pager: [phone]  

## 2018-01-25 NOTE — Progress Notes (Signed)
Advanced Heart Failure VAD Clinic Note   Primary cardiologist: Harwani Primary HF: Dr. [name]  HPI:  Jonathan Wood is a 47 y.o. 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   Admitted 08/25/17 with ADHF and cardiogenic shock. Started on IV lasix and milrinone 0.125 mcg/kg/min with over 27 lbs of diuresis.   Echo showed EF 5-10%, with mild RV dilation, and mod to severe reduction in function.Case discussed with [facility] transplnt team but not candidate for transplant due to ongoing tobacco use. Underwent HM3 LVAD 09/06/17 without any immediate post-op complications. 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  Started on neurontin for pocket pain. Progressed will with PT and thought stable for home on discharge.  Follow up for Heart Failure/LVAD:  He presents today for routine follow up. He continues to do very well. At last visit Entresto increased to 49/51 bid and lasix made prn. Has not needed any lasix. No edema or dizziness. Denies orthopnea or PND. No fevers, chills or problems with driveline. No bleeding, melena or neuro symptoms. No VAD alarms. Taking all meds as prescribed. Denies any smoking, ETOH or drug use.   VAD Indication: Destination Therapy   VAD interrogation & Equipment Management: Speed: 5600 Flow: 4.7 Power: 4.2w PI: 3.1 Hct: 38 Alarms:  None  Events: 10 - 20 PI events  Fixed speed 5600 Low speed limit: 5300  Primary Controller: Replace back up battery in 29 months. Back up controller: Replace back up battery in 29 months.  Reports taking Coumadin as prescribed and adherence to anticoagulation based dietary restrictions.  Denies bright red blood per rectum or melena, no dark urine or hematuria.     I reviewed the LVAD parameters from today, and compared the results to the patient's prior recorded data.  No programming changes were made.  The  LVAD is functioning within specified parameters.  The patient performs LVAD self-test daily.  LVAD interrogation was negative for any significant power changes, alarms or PI events/speed drops.  LVAD equipment check completed and is in good working order.  Back-up equipment present.   LVAD education done on emergency procedures and precautions and reviewed exit site care.    Past Medical History:  Diagnosis Date  . Asthma   . CHF (congestive heart failure) (HCC)    a. 09/2016: EF 20-25% with cath showing normal cors  . GERD (gastroesophageal reflux disease)   . History of hiatal hernia     Current Outpatient Medications  Medication Sig Dispense Refill  . amiodarone (PACERONE) 200 MG tablet 200 mg daily 36 tablet 3  . aspirin EC 81 MG tablet Take 1 tablet (81 mg total) by mouth daily. 30 tablet 1  . citalopram (CELEXA) 10 MG tablet Take 1 tablet (10 mg total) by mouth daily. 30 tablet 3  . gabapentin (NEURONTIN) 300 MG capsule Take 1 capsule (300 mg total) by mouth 3 (three) times daily. 90 capsule 6  . pantoprazole (PROTONIX) 40 MG tablet Take 1 tablet (40 mg total) by mouth daily. 30 tablet 6  . potassium chloride SA (KLOR-CON M20) 20 MEQ tablet Please take 40 meq twice today and then 40 meq daily starting tomorrow. 90 tablet 3  . sacubitril-valsartan (ENTRESTO) 49-51 MG Take 1 tablet by mouth 2 (two) times daily. 60 tablet 6  . sildenafil (REVATIO) 20 MG tablet Take 1 tablet (20 mg total) by mouth 3 (three)  times daily. 90 tablet 3  . traMADol (ULTRAM) 50 MG tablet Take 1-2 tablets (50-100 mg total) by mouth every 4 (four) hours as needed for moderate pain. Try tramadol for pain BEFORE using oxycodone. (Patient not taking: Reported on 01/25/2018) 60 tablet 0  . warfarin (COUMADIN) 5 MG tablet Take 1 tablet (5 mg) daily except 1 and 1/2 tablets (7.5 mg) on Wednesday and Friday     No current facility-administered medications for this encounter.    Patient has no known allergies.  Review  of systems complete and found to be negative unless listed in HPI.    Vitals:   01/25/18 1339 01/25/18 1340  BP: (!) 94/0 94/72  Pulse:  82  SpO2:  98%  Weight:  96.4 kg (212 lb 9.6 oz)  Height:  6\' 2"  (1.88 m)     Wt Readings from Last 3 Encounters:  01/25/18 96.4 kg (212 lb 9.6 oz)  12/15/17 94.5 kg (208 lb 6.4 oz)  11/15/17 92.4 kg (203 lb 9.6 oz)    Vital Signs:  Doppler Pressure:  94 Automatc BP: 94/72 (81) HR:  82 SPO2: 98% on RA  Weight: 212.6 lbs lb w/o eqt Last weight: 208.4 lb   Physical Exam: General:  NAD.  HEENT: normal  Neck: supple. JVP not elevated.  Carotids 2+ bilat; no bruits. No lymphadenopathy or thryomegaly appreciated. Cor: LVAD hum.  Lungs: Clear. Abdomen: soft, nontender, non-distended. No hepatosplenomegaly. No bruits or masses. Good bowel sounds. Driveline site clean. Anchor in place.  Extremities: no cyanosis, clubbing, rash. Warm no edema  Neuro: alert & oriented x 3. No focal deficits. Moves all 4 without problem    ASSESSMENT AND PLAN:   1. Chronic systolic HF - EF 54% s/p HM-3 LVAD on 09/06/17 - Continues to do very well with VAD support. NYHA I  - Volume status looks good. Now off torsemide and just using Entresto,  - Continue Entresto 49/51 mg BID.   - Will begin referral process to [facility] for transplant. Will begin routine cotinine screening. He has been off cigarettes since before VAD implant.  - Reinforced fluid restriction to < 2 L daily, sodium restriction to less than 2000 mg daily, and the importance of daily weights.    2. HM3 LVAD 09/06/2017. - VAD interrogated personally. Parameters stable. - Driveline site stable.  - LDH 232 - INR goal 2.0-2.5. INR 1.61  Discussed dosing with PharmD personally. - Continue ASA.  - Blood pressure well controlled. Continue current regimen.  3. Hypokalemia - K 4/7. Follow.   4. Anxiety - Much improved on Celexa.  - No change to current plan.    5. Essential HTN -Blood pressure well  controlled. Continue current regimen.  Total time spent 35 minutes. Over half that time spent discussing above.   Arvilla Meres, MD  10:56 PM   Patient seen and examined with the above-signed Advanced Practice Provider and/or Housestaff. I personally reviewed laboratory data, imaging studies and relevant notes. I independently examined the patient and formulated the important aspects of the plan. I have edited the note to reflect any of my changes or salient points. I have personally discussed the plan with the patient and/or family.  Overall continues to do very well. NYHA I-early II with VAD support. Volume status looks good. MAPs remain slightly elevated. Will increase Entresto to 49/51 bid. INR 2.36. Discussed dosing with PharmD personally. LDH ok. VAD interrogated personally. Parameters stable. Driveline ok. He remains abstinent from tobacco, ETOH and other  substances, Very committed to his health. Again discussed transplant referral process with him.   Total time personally spent 35 minutes. Over half that time spent discussing above.   Arvilla Meres, MD  10:56 PM

## 2018-01-26 ENCOUNTER — Encounter (HOSPITAL_COMMUNITY): Payer: Medicaid Other

## 2018-02-03 ENCOUNTER — Encounter (HOSPITAL_COMMUNITY)
Admission: RE | Admit: 2018-02-03 | Discharge: 2018-02-03 | Disposition: A | Discharge status: Home / Self Care | Payer: Medicaid Other | Source: Ambulatory Visit | Attending: Internal Medicine | Admitting: Internal Medicine

## 2018-02-03 ENCOUNTER — Other Ambulatory Visit (HOSPITAL_COMMUNITY)
Admission: RE | Admit: 2018-02-03 | Discharge: 2018-02-03 | Disposition: A | Discharge status: Home / Self Care | Payer: Medicaid Other | Source: Ambulatory Visit | Attending: Internal Medicine | Admitting: Internal Medicine

## 2018-02-03 VITALS — BP 101/74 | HR 44 | Ht 74.0 in | Wt 212.6 lb

## 2018-02-03 DIAGNOSIS — Z95811 Presence of heart assist device: Secondary | ICD-10-CM | POA: Insufficient documentation

## 2018-02-03 DIAGNOSIS — Z7901 Long term (current) use of anticoagulants: Secondary | ICD-10-CM | POA: Diagnosis present

## 2018-02-03 DIAGNOSIS — I5022 Chronic systolic (congestive) heart failure: Secondary | ICD-10-CM

## 2018-02-03 LAB — PROTIME-INR
INR: 1.35
PROTHROMBIN TIME: 16.5 s — AB (ref 11.4–15.2)

## 2018-02-03 NOTE — Progress Notes (Signed)
Daily Session Note  Patient Details  Name: Jonathan Wood MRN: [id number] Date of Birth: 1970-08-23 Referring Provider:     CARDIAC REHAB PHASE II ORIENTATION from 02/03/2018 in [place]  Referring Provider  Bensimhon      Encounter Date: 02/03/2018  Check In: Session Check In - 02/03/18 0800      Check-In   Supervising physician immediately available to respond to emergencies  See telemetry face sheet for immediately available MD    Location  [facility]    Staff Present  Russella Dar, MS, EP, [facility], Exercise Physiologist;Debra Wynetta Emery, RN, Cory Munch, Exercise Physiologist    Medication changes reported      No    Fall or balance concerns reported     No    Tobacco Cessation  --   Quit 08/2017   Warm-up and Cool-down  Performed as group-led instruction    Resistance Training Performed  Yes    VAD Patient?  Yes    PAD/SET Patient?  No      VAD patient   Has back up controller?  Yes    Has spare charged batteries?  Yes    Has battery cables?  Yes    Has compatible battery clips?  Yes      Pain Assessment   Currently in Pain?  No/denies    Pain Score  0-No pain    Multiple Pain Sites  No       Capillary Blood Glucose: No results found for this or any previous visit (from the past 24 hour(s)).  Exercise Prescription Changes - 02/03/18 1000      Home Exercise Plan   Plans to continue exercise at  [facility] (comment)   walking track    Frequency  Add 2 additional days to program exercise sessions.    Initial Home Exercises Provided  02/03/18       Social History   Tobacco Use  Smoking Status Current Every Day Smoker  . Packs/day: 0.50  . Years: 25.00  . Pack years: 12.50  . Types: Cigarettes  Smokeless Tobacco Never Used  Tobacco Comment   quit 1 week    Goals Met:  Independence with exercise equipment Exercise tolerated well Personal goals reviewed No report of cardiac concerns or  symptoms Strength training completed today  Goals Unmet:  Not Applicable  Comments: Check out: 1030   Dr. Kate Sable is Medical Director for [place] and Pulmonary Rehab.

## 2018-02-03 NOTE — Progress Notes (Signed)
Cardiac/Pulmonary Rehab Medication Review by a Pharmacist  Does the patient  feel that his/her medications are working for him/her?  yes  Has the patient been experiencing any side effects to the medications prescribed?  no  Does the patient measure his/her own blood pressure or blood glucose at home?  no   Does the patient have any problems obtaining medications due to transportation or finances?   no  Understanding of regimen: excellent Understanding of indications: excellent Potential of compliance: excellent  Questions asked to Determine Patient Understanding of Medication Regimen:  1. What is the name of the medication?  2. What is the medication used for?  3. When should it be taken?  4. How much should be taken?  5. How will you take it?  6. What side effects should you report?  Understanding Defined as: Excellent: All questions above are correct Good: Questions 1-4 are correct Fair: Questions 1-2 are correct  Poor: 1 or none of the above questions are correct   Pharmacist comments: Patient has and LVAD, therefore cannot measure his own BP.     Jonathan Wood 02/03/2018 1:39 PM

## 2018-02-03 NOTE — Progress Notes (Signed)
Cardiac Individual Treatment Plan  Patient Details  Name: Jonathan Wood MRN: [id number] Date of Birth: Apr 01, 1971 Referring Provider:     CARDIAC REHAB PHASE II ORIENTATION from 02/03/2018 in [facility] CARDIAC REHABILITATION  Referring Provider  Bensimhon      Initial Encounter Date:    CARDIAC REHAB PHASE II ORIENTATION from 02/03/2018 in [facility] CARDIAC REHABILITATION  Date  02/03/18      Visit Diagnosis: CHF (congestive heart failure), NYHA class II, chronic, systolic (HCC)  LVAD (left ventricular assist device) present (HCC)  Patient's Home Medications on Admission:  Current Outpatient Medications:  .  amiodarone (PACERONE) 200 MG tablet, 200 mg daily, Disp: 36 tablet, Rfl: 3 .  aspirin EC 81 MG tablet, Take 1 tablet (81 mg total) by mouth daily., Disp: 30 tablet, Rfl: 1 .  citalopram (CELEXA) 10 MG tablet, Take 1 tablet (10 mg total) by mouth daily., Disp: 30 tablet, Rfl: 3 .  pantoprazole (PROTONIX) 40 MG tablet, Take 1 tablet (40 mg total) by mouth daily., Disp: 30 tablet, Rfl: 6 .  sacubitril-valsartan (ENTRESTO) 49-51 MG, Take 1 tablet by mouth 2 (two) times daily., Disp: 60 tablet, Rfl: 6 .  sildenafil (REVATIO) 20 MG tablet, Take 1 tablet (20 mg total) by mouth 3 (three) times daily., Disp: 90 tablet, Rfl: 3 .  traMADol (ULTRAM) 50 MG tablet, Take 1-2 tablets (50-100 mg total) by mouth every 4 (four) hours as needed for moderate pain. Try tramadol for pain BEFORE using oxycodone., Disp: 60 tablet, Rfl: 0 .  warfarin (COUMADIN) 5 MG tablet, Take 1 tablet (5 mg) daily except 1 and 1/2 tablets (7.5 mg) on Wednesday and Friday, Disp: , Rfl:  .  gabapentin (NEURONTIN) 300 MG capsule, Take 1 capsule (300 mg total) by mouth 3 (three) times daily., Disp: 90 capsule, Rfl: 6 .  potassium chloride SA (KLOR-CON M20) 20 MEQ tablet, Please take 40 meq twice today and then 40 meq daily starting tomorrow. (Patient not taking: Reported on 02/03/2018), Disp: 90 tablet, Rfl: 3  Past  Medical History: Past Medical History:  Diagnosis Date  . Asthma   . CHF (congestive heart failure) (HCC)    a. 09/2016: EF 20-25% with cath showing normal cors  . GERD (gastroesophageal reflux disease)   . History of hiatal hernia     Tobacco Use: Social History   Tobacco Use  Smoking Status Current Every Day Smoker  . Packs/day: 0.50  . Years: 25.00  . Pack years: 12.50  . Types: Cigarettes  Smokeless Tobacco Never Used  Tobacco Comment   quit 1 week    Labs: Recent Review Flowsheet Data    Labs for ITP Cardiac and Pulmonary Rehab Latest Ref Rng & Units 09/15/2017 09/16/2017 09/17/2017 09/18/2017 09/19/2017   Cholestrol 0 - 200 mg/dL - - - - -   LDLCALC 0 - 99 mg/dL - - - - -   HDL >79 mg/dL - - - - -   Trlycerides <150 mg/dL - - - - -   Hemoglobin A1c 4.8 - 5.6 % - - - - -   PHART 7.350 - 7.450 - - - - -   PCO2ART 32.0 - 48.0 mmHg - - - - -   HCO3 20.0 - 28.0 mmol/L - - - - -   TCO2 22 - 32 mmol/L - - - - -   ACIDBASEDEF 0.0 - 2.0 mmol/L - - - - -   O2SAT % 55.2 72.5 63.1 62.5 60.9  Capillary Blood Glucose: Lab Results  Component Value Date   GLUCAP 126 (H) 09/19/2017   GLUCAP 83 09/19/2017   GLUCAP 85 09/18/2017   GLUCAP 139 (H) 09/18/2017   GLUCAP 77 09/18/2017     Exercise Target Goals: Exercise Program Goal: Individual exercise prescription set using results from initial 6 min walk test and THRR while considering  patient's activity barriers and safety.   Exercise Prescription Goal: Starting with aerobic activity 30 plus minutes a day, 3 days per week for initial exercise prescription. Provide home exercise prescription and guidelines that participant acknowledges understanding prior to discharge.  Activity Barriers & Risk Stratification: Activity Barriers & Cardiac Risk Stratification - 02/03/18 1038      Activity Barriers & Cardiac Risk Stratification   Activity Barriers  Assistive Device;Other (comment)    Comments  LVAD    Cardiac Risk  Stratification  High       6 Minute Walk: 6 Minute Walk    Row Name 02/03/18 1018         6 Minute Walk   Phase  Initial     Distance  1600 feet     Walk Time  6 minutes     # of Rest Breaks  0     MPH  3.03     METS  3.32     RPE  12     Perceived Dyspnea   12     VO2 Peak  18.74     Symptoms  No     Resting HR  44 bpm     Resting BP  101/74 MAP 80     Resting Oxygen Saturation   97 %     Exercise Oxygen Saturation  during 6 min walk  97 %     Max Ex. HR  117 bpm     Max Ex. BP  157/134 MAP 80     2 Minute Post BP  83/51 MAP 62        Oxygen Initial Assessment:   Oxygen Re-Evaluation:   Oxygen Discharge (Final Oxygen Re-Evaluation):   Initial Exercise Prescription: Initial Exercise Prescription - 02/03/18 1000      Date of Initial Exercise RX and Referring Provider   Date  02/03/18    Referring Provider  Bensimhon    Expected Discharge Date  05/05/18      Treadmill   MPH  2    Grade  0    Minutes  17    METs  2.53      Recumbant Elliptical   Level  1    RPM  41    Watts  44    Minutes  17    METs  2.6      Prescription Details   Frequency (times per week)  3    Duration  Progress to 30 minutes of continuous aerobic without signs/symptoms of physical distress      Intensity   THRR 40-80% of Max Heartrate  97-123-150    Ratings of Perceived Exertion  11-13    Perceived Dyspnea  0-4      Progression   Progression  Continue to progress workloads to maintain intensity without signs/symptoms of physical distress.      Resistance Training   Training Prescription  Yes    Weight  1    Reps  10-15       Perform Capillary Blood Glucose checks as needed.  Exercise Prescription Changes:  Exercise Prescription Changes  Row Name 02/03/18 1000             Home Exercise Plan   Plans to continue exercise at  [facility] (comment) walking track        Frequency  Add 2 additional days to program exercise sessions.       Initial Home  Exercises Provided  02/03/18          Exercise Comments:   Exercise Goals and Review:  Exercise Goals    Row Name 02/03/18 1040             Exercise Goals   Increase Physical Activity  Yes       Intervention  Provide advice, education, support and counseling about physical activity/exercise needs.;Develop an individualized exercise prescription for aerobic and resistive training based on initial evaluation findings, risk stratification, comorbidities and participant's personal goals.       Expected Outcomes  Short Term: Attend rehab on a regular basis to increase amount of physical activity.       Increase Strength and Stamina  Yes       Intervention  Provide advice, education, support and counseling about physical activity/exercise needs.;Develop an individualized exercise prescription for aerobic and resistive training based on initial evaluation findings, risk stratification, comorbidities and participant's personal goals.       Expected Outcomes  Short Term: Increase workloads from initial exercise prescription for resistance, speed, and METs.       Able to understand and use rate of perceived exertion (RPE) scale  Yes       Intervention  Provide education and explanation on how to use RPE scale       Expected Outcomes  Short Term: Able to use RPE daily in rehab to express subjective intensity level;Long Term:  Able to use RPE to guide intensity level when exercising independently       Able to understand and use Dyspnea scale  Yes       Intervention  Provide education and explanation on how to use Dyspnea scale       Expected Outcomes  Short Term: Able to use Dyspnea scale daily in rehab to express subjective sense of shortness of breath during exertion;Long Term: Able to use Dyspnea scale to guide intensity level when exercising independently       Knowledge and understanding of Target Heart Rate Range (THRR)  Yes       Intervention  Provide education and explanation of THRR  including how the numbers were predicted and where they are located for reference       Expected Outcomes  Short Term: Able to state/look up THRR;Long Term: Able to use THRR to govern intensity when exercising independently;Short Term: Able to use daily as guideline for intensity in rehab       Understanding of Exercise Prescription  Yes       Intervention  Provide education, explanation, and written materials on patient's individual exercise prescription       Expected Outcomes  Short Term: Able to explain program exercise prescription;Long Term: Able to explain home exercise prescription to exercise independently          Exercise Goals Re-Evaluation :    Discharge Exercise Prescription (Final Exercise Prescription Changes): Exercise Prescription Changes - 02/03/18 1000      Home Exercise Plan   Plans to continue exercise at  [facility] (comment)   walking track    Frequency  Add 2 additional days to program exercise sessions.  Initial Home Exercises Provided  02/03/18       Nutrition:  Target Goals: Understanding of nutrition guidelines, daily intake of sodium 1500mg , cholesterol 200mg , calories 30% from fat and 7% or less from saturated fats, daily to have 5 or more servings of fruits and vegetables.  Biometrics: Pre Biometrics - 02/03/18 1041      Pre Biometrics   Height  6\' 2"  (1.88 m)    Weight  212 lb 9.6 oz (96.4 kg)    Waist Circumference  41 inches    Hip Circumference  39.5 inches    Waist to Hip Ratio  1.04 %    BMI (Calculated)  27.28    Triceps Skinfold  5 mm    % Body Fat  22.1 %    Grip Strength  29.5 kg    Flexibility  0 in    Single Leg Stand  34 seconds        Nutrition Therapy Plan and Nutrition Goals: Nutrition Therapy & Goals - 02/03/18 1103      Personal Nutrition Goals   Personal Goal #2  Patient has some areas to improve upon with his diet. He is eating a low sodium diet.     Additional Goals?  No       Nutrition  Assessments: Nutrition Assessments - 02/03/18 1104      MEDFICTS Scores   Pre Score  68       Nutrition Goals Re-Evaluation:   Nutrition Goals Discharge (Final Nutrition Goals Re-Evaluation):   Psychosocial: Target Goals: Acknowledge presence or absence of significant depression and/or stress, maximize coping skills, provide positive support system. Participant is able to verbalize types and ability to use techniques and skills needed for reducing stress and depression.  Initial Review & Psychosocial Screening: Initial Psych Review & Screening - 02/03/18 1059      Initial Review   Current issues with  Current Depression;Current Stress Concerns   he said depression is very low. Just every now and again due to his recent health issues.   Source of Stress Concerns  Family   York Spaniel it is tough being a single father of 4 children at times.      Family Dynamics   Good Support System?  Yes      Barriers   Psychosocial barriers to participate in program  There are no identifiable barriers or psychosocial needs.      Screening Interventions   Interventions  Encouraged to exercise    Expected Outcomes  Short Term goal: Identification and review with participant of any Quality of Life or Depression concerns found by scoring the questionnaire.;Long Term goal: The participant improves quality of Life and PHQ9 Scores as seen by post scores and/or verbalization of changes       Quality of Life Scores: Quality of Life - 02/03/18 1042      Quality of Life   Select  Quality of Life      Quality of Life Scores   Health/Function Pre  21.93 %    Socioeconomic Pre  16 %    Psych/Spiritual Pre  24 %    Family Pre  28.5 %    GLOBAL Pre  21.91 %      Scores of 19 and below usually indicate a poorer quality of life in these areas.  A difference of  2-3 points is a clinically meaningful difference.  A difference of 2-3 points in the total score of the Quality of Life Index has been  associated  with significant improvement in overall quality of life, self-image, physical symptoms, and general health in studies assessing change in quality of life.  PHQ-9: Recent Review Flowsheet Data    There is no flowsheet data to display.     Interpretation of Total Score  Total Score Depression Severity:  1-4 = Minimal depression, 5-9 = Mild depression, 10-14 = Moderate depression, 15-19 = Moderately severe depression, 20-27 = Severe depression   Psychosocial Evaluation and Intervention: Psychosocial Evaluation - 02/03/18 1101      Psychosocial Evaluation & Interventions   Interventions  Stress management education;Encouraged to exercise with the program and follow exercise prescription    Continue Psychosocial Services   Follow up required by staff       Psychosocial Re-Evaluation:   Psychosocial Discharge (Final Psychosocial Re-Evaluation):   Vocational Rehabilitation: Provide vocational rehab assistance to qualifying candidates.   Vocational Rehab Evaluation & Intervention: Vocational Rehab - 02/03/18 1105      Initial Vocational Rehab Evaluation & Intervention   Assessment shows need for Vocational Rehabilitation  Yes    Vocational Rehab Packet given to patient  --   Vocational Rehab coordinator will giv ehim his packet. during meeting.    Documents faxed to [facility] Dept of Vocational Rehabilitation  --   Will call vocational rehab coordinator      Education: Education Goals: Education classes will be provided on a weekly basis, covering required topics. Participant will state understanding/return demonstration of topics presented.  Learning Barriers/Preferences: Learning Barriers/Preferences - 02/03/18 1104      Learning Barriers/Preferences   Learning Barriers  None    Learning Preferences  Individual Instruction;Group Instruction;Verbal Instruction       Education Topics: Hypertension, Hypertension Reduction -Define heart disease and high blood pressure. Discus  how high blood pressure affects the body and ways to reduce high blood pressure.   Exercise and Your Heart -Discuss why it is important to exercise, the FITT principles of exercise, normal and abnormal responses to exercise, and how to exercise safely.   Angina -Discuss definition of angina, causes of angina, treatment of angina, and how to decrease risk of having angina.   Cardiac Medications -Review what the following cardiac medications are used for, how they affect the body, and side effects that may occur when taking the medications.  Medications include Aspirin, Beta blockers, calcium channel blockers, ACE Inhibitors, angiotensin receptor blockers, diuretics, digoxin, and antihyperlipidemics.   Congestive Heart Failure -Discuss the definition of CHF, how to live with CHF, the signs and symptoms of CHF, and how keep track of weight and sodium intake.   Heart Disease and Intimacy -Discus the effect sexual activity has on the heart, how changes occur during intimacy as we age, and safety during sexual activity.   Smoking Cessation / COPD -Discuss different methods to quit smoking, the health benefits of quitting smoking, and the definition of COPD.   Nutrition I: Fats -Discuss the types of cholesterol, what cholesterol does to the heart, and how cholesterol levels can be controlled.   Nutrition II: Labels -Discuss the different components of food labels and how to read food label   Heart Parts/Heart Disease and PAD -Discuss the anatomy of the heart, the pathway of blood circulation through the heart, and these are affected by heart disease.   Stress I: Signs and Symptoms -Discuss the causes of stress, how stress may lead to anxiety and depression, and ways to limit stress.   Stress II: Relaxation -Discuss different types of relaxation  techniques to limit stress.   Warning Signs of Stroke / TIA -Discuss definition of a stroke, what the signs and symptoms are of a  stroke, and how to identify when someone is having stroke.   Knowledge Questionnaire Score: Knowledge Questionnaire Score - 02/03/18 1104      Knowledge Questionnaire Score   Pre Score  18/24       Core Components/Risk Factors/Patient Goals at Admission: Personal Goals and Risk Factors at Admission - 02/03/18 1108      Core Components/Risk Factors/Patient Goals on Admission    Weight Management  Weight Maintenance    Personal Goal Other  Yes    Personal Goal  Get back in shape to get heart transplant    Intervention  Attend CR 3 x week and supplement with home exercise 2 x week    Expected Outcomes  Reach goal of getting heart transplant       Core Components/Risk Factors/Patient Goals Review:    Core Components/Risk Factors/Patient Goals at Discharge (Final Review):    ITP Comments:   Comments: Patient arrived for 1st visit/orientation/education at n0800. Patient was referred to CR by Dr. [name] due to Systolic CHF (I50.22) and LVAD (Z95.811). During orientation advised patient on arrival and appointment times what to wear, what to do before, during and after exercise. Reviewed attendance and class policy. Talked about inclement weather and class consultation policy. Pt is scheduled to return Cardiac Rehab on 02/06/18 at 11:00. Pt was advised to come to class 15 minutes before class starts. Patient was also given instructions on meeting with the dietician and attending the Family Structure classes. Discussed RPE/Dpysnea scales. Discussed initial THR and how to find their radial and/or carotid pulse. Discussed the initial exercise prescription and how this effects their progress. Pt is eager to get started. Patient participated in warm up stretches followed by light weights and resistance bands. Patient was able to complete 6 minute walk test. Patient did not c/o pain. Patient was measured for the equipment. Discussed equipment safety with patient. Took patient pre-anthropometric  measurements. Patient finished visit at 1030.

## 2018-02-06 ENCOUNTER — Inpatient Hospital Stay (HOSPITAL_COMMUNITY)
Admission: AD | Admit: 2018-02-06 | Discharge: 2018-02-09 | DRG: 315 | Disposition: A | Discharge status: Home / Self Care | Payer: Medicaid Other | Source: Ambulatory Visit | Attending: Internal Medicine | Admitting: Internal Medicine

## 2018-02-06 ENCOUNTER — Encounter (HOSPITAL_COMMUNITY): Payer: Self-pay

## 2018-02-06 ENCOUNTER — Other Ambulatory Visit (HOSPITAL_COMMUNITY): Payer: Self-pay | Admitting: *Deleted

## 2018-02-06 ENCOUNTER — Ambulatory Visit (HOSPITAL_COMMUNITY)
Admission: RE | Admit: 2018-02-06 | Discharge: 2018-02-06 | Disposition: A | Discharge status: Home / Self Care | Payer: Medicaid Other | Source: Ambulatory Visit | Attending: Internal Medicine | Admitting: Internal Medicine

## 2018-02-06 ENCOUNTER — Inpatient Hospital Stay (HOSPITAL_COMMUNITY): Payer: Medicaid Other

## 2018-02-06 ENCOUNTER — Other Ambulatory Visit: Payer: Self-pay

## 2018-02-06 ENCOUNTER — Encounter (HOSPITAL_COMMUNITY): Payer: Medicaid Other

## 2018-02-06 DIAGNOSIS — I5022 Chronic systolic (congestive) heart failure: Secondary | ICD-10-CM | POA: Diagnosis present

## 2018-02-06 DIAGNOSIS — R52 Pain, unspecified: Secondary | ICD-10-CM

## 2018-02-06 DIAGNOSIS — F1010 Alcohol abuse, uncomplicated: Secondary | ICD-10-CM | POA: Diagnosis present

## 2018-02-06 DIAGNOSIS — Y831 Surgical operation with implant of artificial internal device as the cause of abnormal reaction of the patient, or of later complication, without mention of misadventure at the time of the procedure: Secondary | ICD-10-CM | POA: Diagnosis present

## 2018-02-06 DIAGNOSIS — I5042 Chronic combined systolic (congestive) and diastolic (congestive) heart failure: Secondary | ICD-10-CM

## 2018-02-06 DIAGNOSIS — E871 Hypo-osmolality and hyponatremia: Secondary | ICD-10-CM | POA: Diagnosis present

## 2018-02-06 DIAGNOSIS — K219 Gastro-esophageal reflux disease without esophagitis: Secondary | ICD-10-CM | POA: Diagnosis present

## 2018-02-06 DIAGNOSIS — Z95811 Presence of heart assist device: Secondary | ICD-10-CM | POA: Diagnosis not present

## 2018-02-06 DIAGNOSIS — Z7901 Long term (current) use of anticoagulants: Secondary | ICD-10-CM

## 2018-02-06 DIAGNOSIS — Z7982 Long term (current) use of aspirin: Secondary | ICD-10-CM

## 2018-02-06 DIAGNOSIS — I428 Other cardiomyopathies: Secondary | ICD-10-CM | POA: Diagnosis present

## 2018-02-06 DIAGNOSIS — I110 Hypertensive heart disease with heart failure: Secondary | ICD-10-CM | POA: Diagnosis present

## 2018-02-06 DIAGNOSIS — G629 Polyneuropathy, unspecified: Secondary | ICD-10-CM

## 2018-02-06 DIAGNOSIS — T827XXA Infection and inflammatory reaction due to other cardiac and vascular devices, implants and grafts, initial encounter: Secondary | ICD-10-CM | POA: Diagnosis present

## 2018-02-06 DIAGNOSIS — J45909 Unspecified asthma, uncomplicated: Secondary | ICD-10-CM | POA: Diagnosis present

## 2018-02-06 DIAGNOSIS — F419 Anxiety disorder, unspecified: Secondary | ICD-10-CM | POA: Diagnosis present

## 2018-02-06 DIAGNOSIS — L03311 Cellulitis of abdominal wall: Secondary | ICD-10-CM | POA: Diagnosis present

## 2018-02-06 DIAGNOSIS — Z79899 Other long term (current) drug therapy: Secondary | ICD-10-CM | POA: Diagnosis not present

## 2018-02-06 DIAGNOSIS — F17210 Nicotine dependence, cigarettes, uncomplicated: Secondary | ICD-10-CM | POA: Diagnosis present

## 2018-02-06 LAB — PROTIME-INR
INR: 1.51
PROTHROMBIN TIME: 18.1 s — AB (ref 11.4–15.2)

## 2018-02-06 LAB — CBC
HEMATOCRIT: 41.7 % (ref 39.0–52.0)
HEMOGLOBIN: 13 g/dL (ref 13.0–17.0)
MCH: 27.8 pg (ref 26.0–34.0)
MCHC: 31.2 g/dL (ref 30.0–36.0)
MCV: 89.3 fL (ref 78.0–100.0)
PLATELETS: 174 10*3/uL (ref 150–400)
RBC: 4.67 MIL/uL (ref 4.22–5.81)
RDW: 15.2 % (ref 11.5–15.5)
WBC: 11.8 10*3/uL — AB (ref 4.0–10.5)

## 2018-02-06 LAB — HEPARIN LEVEL (UNFRACTIONATED): Heparin Unfractionated: 0.19 IU/mL — ABNORMAL LOW (ref 0.30–0.70)

## 2018-02-06 LAB — LACTATE DEHYDROGENASE: LDH: 190 U/L (ref 98–192)

## 2018-02-06 LAB — BASIC METABOLIC PANEL
ANION GAP: 9 (ref 5–15)
BUN: 12 mg/dL (ref 6–20)
CALCIUM: 8.8 mg/dL — AB (ref 8.9–10.3)
CO2: 20 mmol/L — ABNORMAL LOW (ref 22–32)
Chloride: 104 mmol/L (ref 98–111)
Creatinine, Ser: 1.05 mg/dL (ref 0.61–1.24)
GFR calc non Af Amer: >60 mL/min (ref >60)
Glucose, Bld: 123 mg/dL — ABNORMAL HIGH (ref 70–99)
Potassium: 4.3 mmol/L (ref 3.5–5.1)
SODIUM: 133 mmol/L — AB (ref 135–145)

## 2018-02-06 LAB — MRSA PCR SCREENING: MRSA by PCR: NEGATIVE

## 2018-02-06 MED ORDER — AMIODARONE HCL 200 MG PO TABS
200.0000 mg | ORAL_TABLET | Freq: Every day | ORAL | Status: DC
  Administered 2018-02-07 – 2018-02-09 (×3): 200 mg via ORAL
  Filled 2018-02-06 (×3): qty 1

## 2018-02-06 MED ORDER — SILDENAFIL CITRATE 20 MG PO TABS
20.0000 mg | ORAL_TABLET | Freq: Three times a day (TID) | ORAL | Status: DC
  Administered 2018-02-06 – 2018-02-09 (×10): 20 mg via ORAL
  Filled 2018-02-06 (×10): qty 1

## 2018-02-06 MED ORDER — SODIUM CHLORIDE 0.9 % IV BOLUS
500.0000 mL | Freq: Once | INTRAVENOUS | Status: AC
  Administered 2018-02-06: 500 mL via INTRAVENOUS

## 2018-02-06 MED ORDER — HEPARIN (PORCINE) IN NACL 100-0.45 UNIT/ML-% IJ SOLN
1400.0000 [IU]/h | INTRAMUSCULAR | Status: AC
  Administered 2018-02-06: 1200 [IU]/h via INTRAVENOUS
  Administered 2018-02-07 – 2018-02-09 (×4): 1400 [IU]/h via INTRAVENOUS
  Filled 2018-02-06 (×5): qty 250

## 2018-02-06 MED ORDER — ASPIRIN EC 81 MG PO TBEC
81.0000 mg | DELAYED_RELEASE_TABLET | Freq: Every day | ORAL | Status: DC
  Administered 2018-02-07 – 2018-02-09 (×3): 81 mg via ORAL
  Filled 2018-02-06 (×3): qty 1

## 2018-02-06 MED ORDER — TRAMADOL HCL 50 MG PO TABS
50.0000 mg | ORAL_TABLET | ORAL | Status: DC | PRN
  Administered 2018-02-06 (×2): 100 mg via ORAL
  Filled 2018-02-06 (×2): qty 2

## 2018-02-06 MED ORDER — IOPAMIDOL (ISOVUE-300) INJECTION 61%
INTRAVENOUS | Status: AC
  Administered 2018-02-06: 11:00:00
  Filled 2018-02-06: qty 30

## 2018-02-06 MED ORDER — ONDANSETRON HCL 4 MG/2ML IJ SOLN: 4.0000 mg | Freq: Four times a day (QID) | INTRAMUSCULAR | Status: DC | PRN

## 2018-02-06 MED ORDER — GABAPENTIN 300 MG PO CAPS
300.0000 mg | ORAL_CAPSULE | Freq: Three times a day (TID) | ORAL | Status: DC
  Administered 2018-02-06 – 2018-02-09 (×10): 300 mg via ORAL
  Filled 2018-02-06 (×10): qty 1

## 2018-02-06 MED ORDER — ACETAMINOPHEN 325 MG PO TABS
650.0000 mg | ORAL_TABLET | ORAL | Status: DC | PRN
  Administered 2018-02-06: 650 mg via ORAL
  Filled 2018-02-06: qty 2

## 2018-02-06 MED ORDER — OXYCODONE-ACETAMINOPHEN 5-325 MG PO TABS
1.0000 | ORAL_TABLET | Freq: Three times a day (TID) | ORAL | Status: DC | PRN
  Administered 2018-02-06 – 2018-02-08 (×7): 1 via ORAL
  Filled 2018-02-06 (×8): qty 1

## 2018-02-06 MED ORDER — PANTOPRAZOLE SODIUM 40 MG PO TBEC
40.0000 mg | DELAYED_RELEASE_TABLET | Freq: Every day | ORAL | Status: DC
  Administered 2018-02-07 – 2018-02-09 (×3): 40 mg via ORAL
  Filled 2018-02-06 (×3): qty 1

## 2018-02-06 MED ORDER — IOHEXOL 300 MG/ML  SOLN
100.0000 mL | Freq: Once | INTRAMUSCULAR | Status: AC | PRN
  Administered 2018-02-06: 100 mL via INTRAVENOUS

## 2018-02-06 MED ORDER — VANCOMYCIN HCL 10 G IV SOLR
1250.0000 mg | Freq: Two times a day (BID) | INTRAVENOUS | Status: DC
  Administered 2018-02-06 – 2018-02-08 (×5): 1250 mg via INTRAVENOUS
  Filled 2018-02-06 (×6): qty 1250

## 2018-02-06 MED ORDER — SODIUM CHLORIDE 0.9 % IV SOLN
2.0000 g | Freq: Two times a day (BID) | INTRAVENOUS | Status: DC
  Administered 2018-02-06 – 2018-02-08 (×6): 2 g via INTRAVENOUS
  Filled 2018-02-06 (×7): qty 2

## 2018-02-06 MED ORDER — CITALOPRAM HYDROBROMIDE 20 MG PO TABS
10.0000 mg | ORAL_TABLET | Freq: Every day | ORAL | Status: DC
  Administered 2018-02-07 – 2018-02-09 (×3): 10 mg via ORAL
  Filled 2018-02-06 (×3): qty 1

## 2018-02-06 NOTE — Progress Notes (Signed)
Pharmacy Antibiotic Note  Jonathan Wood is a 47 y.o. 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  Pharmacy has been consulted for vancomycin and cefepime dosing. SCr at baseline, pt previously therapeutic on vancomycin 1250mg  IV q12h. Pt may require I&D in OR.  Plan: Cefepime 2g IV q12h Vancomycin 1250mg  IV q12h Monitor cultures, LOT, renal function   Temp (24hrs), Avg:98.6 F (37 C), Min:98.6 F (37 C), Max:98.6 F (37 C)  Recent Labs  Lab 02/06/18 0835  WBC 11.8*  CREATININE 1.05    Estimated Creatinine Clearance: 104.4 mL/min (by C-G formula based on SCr of 1.05 mg/dL).    No Known Allergies  Antimicrobials this admission: Vancomycin 9/23 >>  Cefepime 9/23 >>   Dose adjustments this admission: none  Microbiology results: sent  Thank you for allowing pharmacy to be a part of this patient's care.  Fredonia Highland, PharmD, BCPS Clinical Pharmacist [phone] Please check AMION for all [facility] Pharmacy numbers 02/06/2018

## 2018-02-06 NOTE — H&P (Addendum)
Advanced Heart Failure VAD History and Physical Note   PCP-Cardiologist: No primary care provider on file.   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   HPI:    Jonathan Wood is a 47 y.o. 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   Last seen in HF clinic 01/25/18. Entresto increased with elevated maps. Otherwise doing very well.   Pt started to have pain and tenderness around his driveline with increased drainage on 02/03/18. Initially, the drainage improved but the pain has worsened. Presented to clinic 02/06/18 with 10/10 pain near his driveline site. Noted to have erythema and calor and an area concerning for fluctuance/abscess. Admitted for further evaluation and treatment.   He feels OK currently. Denies SOB. No lightheadedness or dizziness despite lower MAPs. No CP. Remains tender around and adjacent to his driveline and says he had some discharge from it earlier. Denies trauma. Taking all medications as directed. Pertinent labs on admission include K 4.3, Cr 1.05, WBC 11.8, Hgb 13.0, INR 1.51. BCx pending. CT Abd/Pelvis pending. Denies fevers or chills.   LVAD INTERROGATION:  HeartMate II LVAD:  Flow 5.1 liters/min, speed 5600, power 4.3, PI 2.4. Occasional PI events. Multiple low voltage events 9/18 and before. States he was away from home and ran batteries low.      Review of Systems: [y] = yes, [ ]  = no   General: Weight gain [ ] ; Weight loss [ ] ; Anorexia [ ] ; Fatigue [y]; Fever [ ] ; Chills [ ] ; Weakness [ ]   Cardiac: Chest pain/pressure [ ] ; Resting SOB [ ] ; Exertional SOB [ ] ; Orthopnea [ ] ; Pedal Edema [ ] ; Palpitations [ ] ; Syncope [ ] ; Presyncope [ ] ; Paroxysmal nocturnal dyspnea[ ]   Pulmonary: Cough [ ] ; Wheezing[ ] ; Hemoptysis[ ] ; Sputum [ ] ; Snoring [ ]   GI: Vomiting[ ] ; Dysphagia[ ] ; Melena[ ] ; Hematochezia [ ] ; Heartburn[ ] ; Abdominal pain [place] ]; Constipation [ ] ;  Diarrhea [ ] ; BRBPR [ ]   GU: Hematuria[ ] ; Dysuria [ ] ; Nocturia[ ]   Vascular: Pain in legs with walking [ ] ; Pain in feet with lying flat [ ] ; Non-healing sores [ ] ; Stroke [ ] ; TIA [ ] ; Slurred speech [ ] ;  Neuro: Headaches[ ] ; Vertigo[ ] ; Seizures[ ] ; Paresthesias[ ] ;Blurred vision [ ] ; Diplopia [ ] ; Vision changes [ ]   Ortho/Skin: Arthritis [y]; Joint pain [y]; Muscle pain [ ] ; Joint swelling [ ] ; Back Pain [ ] ; Rash [ ]   Psych: Depression[ ] ; Anxiety[ ]   Heme: Bleeding problems [ ] ; Clotting disorders [ ] ; Anemia [ ]   Endocrine: Diabetes [ ] ; Thyroid dysfunction[ ]     Home Medications Prior to Admission medications   Medication Sig Start Date End Date Taking? Authorizing Provider  amiodarone (PACERONE) 200 MG tablet 200 mg daily 11/15/17   Bensimhon, Bevelyn Buckles, MD  aspirin EC 81 MG tablet Take 1 tablet (81 mg total) by mouth daily. 11/15/17   Bensimhon, Bevelyn Buckles, MD  citalopram (CELEXA) 10 MG tablet Take 1 tablet (10 mg total) by mouth daily. 11/15/17   Bensimhon, Bevelyn Buckles, MD  gabapentin (NEURONTIN) 300 MG capsule Take 1 capsule (300 mg total) by mouth 3 (three) times daily. 12/27/17   Bensimhon, Bevelyn Buckles, MD  pantoprazole (PROTONIX) 40 MG tablet Take 1 tablet (40 mg total) by mouth daily. 11/15/17   Bensimhon, Bevelyn Buckles, MD  potassium chloride SA (KLOR-CON M20) 20 MEQ tablet Please take 40 meq  twice today and then 40 meq daily starting tomorrow. Patient not taking: Reported on 02/03/2018 09/30/17   Bensimhon, Bevelyn Buckles, MD  sacubitril-valsartan (ENTRESTO) 49-51 MG Take 1 tablet by mouth 2 (two) times daily. 12/15/17   Bensimhon, Bevelyn Buckles, MD  sildenafil (REVATIO) 20 MG tablet Take 1 tablet (20 mg total) by mouth 3 (three) times daily. 11/15/17   Bensimhon, Bevelyn Buckles, MD  traMADol (ULTRAM) 50 MG tablet Take 1-2 tablets (50-100 mg total) by mouth every 4 (four) hours as needed for moderate pain. Try tramadol for pain BEFORE using oxycodone. 12/27/17   Bensimhon, Bevelyn Buckles, MD  warfarin (COUMADIN) 5 MG tablet  Take 1 tablet (5 mg) daily except 1 and 1/2 tablets (7.5 mg) on Wednesday and Friday    [provider]    Past Medical History: Past Medical History:  Diagnosis Date  . Asthma   . CHF (congestive heart failure) (HCC)    a. 09/2016: EF 20-25% with cath showing normal cors  . GERD (gastroesophageal reflux disease)   . History of hiatal hernia     Past Surgical History: Past Surgical History:  Procedure Laterality Date  . IABP INSERTION N/A 09/05/2017   Procedure: IABP INSERTION;  Surgeon: Dolores Patty, MD;  Location: [facility] INVASIVE CV LAB;  Service: Cardiovascular;  Laterality: N/A;  . INSERTION OF IMPLANTABLE LEFT VENTRICULAR ASSIST DEVICE N/A 09/06/2017   Procedure: INSERTION OF IMPLANTABLE LEFT VENTRICULAR ASSIST DEVICE - HM3;  Surgeon: Kerin Perna, MD;  Location: [facility] OR;  Service: Open Heart Surgery;  Laterality: N/A;  HM3  . NASAL FRACTURE SURGERY  1987  . RIGHT HEART CATH N/A 08/30/2017   Procedure: RIGHT HEART CATH;  Surgeon: Dolores Patty, MD;  Location: [facility] INVASIVE CV LAB;  Service: Cardiovascular;  Laterality: N/A;  . RIGHT/LEFT HEART CATH AND CORONARY ANGIOGRAPHY N/A 10/08/2016   Procedure: Right/Left Heart Cath and Coronary Angiography;  Surgeon: Orpah Cobb, MD;  Location: [facility] INVASIVE CV LAB;  Service: Cardiovascular;  Laterality: N/A;  . TEE WITHOUT CARDIOVERSION N/A 09/06/2017   Procedure: TRANSESOPHAGEAL ECHOCARDIOGRAM (TEE);  Surgeon: Donata Clay, Theron Arista, MD;  Location: [facility] OR;  Service: Open Heart Surgery;  Laterality: N/A;    Family History: Family History  Problem Relation Age of Onset  . Hypertension Father     Social History: Social History   Socioeconomic History  . Marital status: Legally Separated    Spouse name: Not on file  . Number of children: Not on file  . Years of education: Not on file  . Highest education level: Not on file  Occupational History  . Not on file  Social Needs  . Financial resource strain: Not on file  .  Food insecurity:    Worry: Not on file    Inability: Not on file  . Transportation needs:    Medical: Not on file    Non-medical: Not on file  Tobacco Use  . Smoking status: Current Every Day Smoker    Packs/day: 0.50    Years: 25.00    Pack years: 12.50    Types: Cigarettes  . Smokeless tobacco: Never Used  . Tobacco comment: quit 1 week  Substance and Sexual Activity  . Alcohol use: Yes    Comment: occas  . Drug use: No  . Sexual activity: Yes  Lifestyle  . Physical activity:    Days per week: Not on file    Minutes per session: Not on file  . Stress: Not on file  Relationships  .  Social connections:    Talks on phone: Not on file    Gets together: Not on file    Attends religious service: Not on file    Active member of club or organization: Not on file    Attends meetings of clubs or organizations: Not on file    Relationship status: Not on file  Other Topics Concern  . Not on file  Social History Narrative  . Not on file    Allergies:  No Known Allergies  Objective:    Vital Signs:   Vitals:   02/06/18 1000 02/06/18 1137  BP:  (!) 73/56  Pulse:  93  Resp: 20 20  Temp:  99 F (37.2 C)  TempSrc:  Oral  SpO2: 100%   Weight:  93.8 kg  Height:  6' 2.02" (1.88 m)    Mean arterial Pressure 60-70s  Physical Exam    General: No resp difficulty HEENT: Normal Neck: supple. JVP not elevated. Carotids 2+ bilat; no bruits. No lymphadenopathy or thyromegaly appreciated. Cor: Mechanical heart sounds with LVAD hum present. Lungs: Clear Abdomen: Soft, nondistended. No hepatosplenomegaly. No bruits or masses. Good bowel sounds. Driveline: C/D/I; securement device intact currently. Changed in clinic this am. + drainage. Area of tenderness and warmth medially to his LLQ driveline.  Extremities: no cyanosis, clubbing, rash, edema Neuro: alert & orientedx3, cranial nerves grossly intact. moves all 4 extremities w/o difficulty. Affect pleasant  Telemetry   Not yet  connected.   EKG   No new tracing.   Labs    Basic Metabolic Panel: Recent Labs  Lab 02/06/18 0835  NA 133*  K 4.3  CL 104  CO2 20*  GLUCOSE 123*  BUN 12  CREATININE 1.05  CALCIUM 8.8*    Liver Function Tests: No results for input(s): AST, ALT, ALKPHOS, BILITOT, PROT, ALBUMIN in the last 168 hours. No results for input(s): LIPASE, AMYLASE in the last 168 hours. No results for input(s): AMMONIA in the last 168 hours.  CBC: Recent Labs  Lab 02/06/18 0835  WBC 11.8*  HGB 13.0  HCT 41.7  MCV 89.3  PLT 174    Cardiac Enzymes: No results for input(s): CKTOTAL, CKMB, CKMBINDEX, TROPONINI in the last 168 hours.  BNP: BNP (last 3 results) Recent Labs    08/25/17 1337 09/07/17 0411 09/13/17 0348  BNP 2,510.0* 272.7* 233.6*    ProBNP (last 3 results) No results for input(s): PROBNP in the last 8760 hours.   CBG: No results for input(s): GLUCAP in the last 168 hours.  Coagulation Studies: Recent Labs    02/06/18 0835  LABPROT 18.1*  INR 1.51    Other results:  Imaging    Ct Abdomen Pelvis W Contrast  Result Date: 02/06/2018 CLINICAL DATA:  Abdominal pain and fever, possible abscess EXAM: CT ABDOMEN AND PELVIS WITH CONTRAST TECHNIQUE: Multidetector CT imaging of the abdomen and pelvis was performed using the standard protocol following bolus administration of intravenous contrast. CONTRAST:  OMNIPAQUE IOHEXOL 300 MG/ML  SOLN COMPARISON:  Abdominal ultrasound 09/12/2017 FINDINGS: Lower chest: A left ventricular assist device is present. Moderate cardiomegaly. Hepatobiliary: Unremarkable Pancreas: Unremarkable Spleen: Unremarkable Adrenals/Urinary Tract: There is contrast medium in the collecting systems on the initial images, which less in sensitivity for nonobstructive renal calculi. No hydronephrosis or hydroureter. Urinary bladder unremarkable. Complex lesion of the left kidney upper pole measures 1.3 by 1.6 cm on image 6/8. This has slightly  ill-defined margins, but previously had a more cystic appearance. In particular on 07/25/2012  there is a benign appearing renal cyst in this location. Two left and a single right should hypodense renal lesion are technically too small to characterize although statistically likely to be benign. Stomach/Bowel: Unremarkable Vascular/Lymphatic: Aortoiliac atherosclerotic vascular disease. 9 mm left external iliac lymph node. Reproductive: Unremarkable Other: The left ventricular assist device driveline extends substernally out to the subcutaneous tissues and skin surface. There is some mild stranding in the subcutaneous tissues around the drive line, and a thin margin of potential fluid density measuring up to about 4 mm in thickness around the tube. I do not see any definite gas tracking in the soft tissues. No separate drainable abscess is identified. Musculoskeletal: Prior median sternotomy. IMPRESSION: 1. There is some fluid density and stranding around the subcutaneous portion of the LVAD driveline. Much of this may be normal/incidental. There are no specific guidelines to indicate a certain thickness of fluid density or granulation tissue at which infection should be suspected. I do not see a separate abscess or gas tracking in the soft tissues. 2. Moderate cardiomegaly. 3. A complex lesion of the left kidney upper pole is likely a collapsed/complex cyst. There is a larger simple appearing cyst in this location back in 2014. Surveillance may be warranted. 4.  Aortic Atherosclerosis (ICD10-I70.0). Electronically Signed   By: Gaylyn Rong M.D.   On: 02/06/2018 14:02     Patient Profile:   Jonathan Wood is a 47 y.o. 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   Presented to clinic 02/06/18 with 10/10 pain at his driveline site. Noted to have erythema and calor. Admitted for further evaluation and treatment.    Assessment/Plan:    1. 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  - With erythema and calor - BCx sent.  - CT ABD/Pelvis W Contrast Ordered.  - Start Vanc/Cefepime empirically once BCx drawn.   2. Chronic systolic HF - EF 09% s/p HM-3 LVAD on 09/06/17 - Continues to do very well with VAD support. NYHA I  - Volume status looks good. Now off torsemide and just using Entresto,  - [employer] with soft pressures.  - Will begin referral process to [facility] for transplant. Will begin routine cotinine screening. He has been off cigarettes since before VAD implant.  - Reinforced fluid restriction to < 2 L daily, sodium restriction to less than 2000 mg daily, and the importance of daily weights.    3. HM3 LVAD 09/06/2017. - VAD interrogated personally. Parameters stable.   - Driveline site stable.  - LDH 190 - INR goal 2.0-2.5. INR 1.51. Start heparin. Discussed dosing with PharmD personally. - Continue ASA.  - Blood pressure well controlled. Continue current regimen.  4. Anxiety - Much improved on Celexa.  - No change to current plan.    5. Essential HTN - Watch closely for sepsis. Hold Entresto for now.   I reviewed the LVAD parameters from today, and compared the results to the patient's prior recorded data.  No programming changes were made.  The LVAD is functioning within specified parameters.  The patient performs LVAD self-test daily.  LVAD interrogation was negative for any significant power changes, alarms or PI events/speed drops.  LVAD equipment check completed and is in good working order.  Back-up equipment present.   LVAD education done on emergency procedures and precautions and reviewed exit site care.  Length of Stay: 0  Arvilla Meres, MD 02/06/2018, 2:25 PM  VAD Team Pager  [phone] (7am - 7am) +++VAD ISSUES ONLY+++   Advanced Heart Failure Team Pager [phone] (M-F; 7a - 4p)  Please contact [facility] for night-coverage after hours (4p -7a ) and weekends on  [email or web address] for all non- LVAD Issues  Patient seen and examined with the above-signed Advanced Practice Provider and/or Housestaff. I personally reviewed laboratory data, imaging studies and relevant notes. I independently examined the patient and formulated the important aspects of the plan. I have edited the note to reflect any of my changes or salient points. I have personally discussed the plan with the patient and/or family.  Driveline site concerning for possible tract infection/abscess. Unable to find tract on exam. Will need to be admitted for CT scan, IV antibiotics and probable surgical I&D with Dr. Donata Clay. MAPs low so will give IVF here. Check BCx and wound cx. Hold Entresto. VAD interrogated personally. Parameters stable.  Arvilla Meres, MD  2:25 PM

## 2018-02-06 NOTE — Progress Notes (Signed)
ANTICOAGULATION CONSULT NOTE   Pharmacy Consult for heparin Indication: LVAD  No Known Allergies  Patient Measurements: Height: 6' 2.02" (188 cm) Weight: 206 lb 12.7 oz (93.8 kg) IBW/kg (Calculated) : 82.24 Heparin Dosing Weight: 96kg  Vital Signs: Temp: 101.6 F (38.7 C) (09/23 1946) Temp Source: Oral (09/23 1600) BP: 72/42 (09/23 1600) Pulse Rate: 93 (09/23 1601)  Labs: Recent Labs    02/06/18 0835 02/06/18 1830  HGB 13.0  --   HCT 41.7  --   PLT 174  --   LABPROT 18.1*  --   INR 1.51  --   HEPARINUNFRC  --  0.19*  CREATININE 1.05  --     Estimated Creatinine Clearance: 104.4 mL/min (by C-G formula based on SCr of 1.05 mg/dL).   Medical History: Past Medical History:  Diagnosis Date  . Asthma   . CHF (congestive heart failure) (HCC)    a. 09/2016: EF 20-25% with cath showing normal cors  . GERD (gastroesophageal reflux disease)   . History of hiatal hernia      Assessment: 15 yoM with LVAD admitted with likely driveline infection. INR subtherapeutic on admission and pt may ultimately require I&D, so pharmacy consulted to hold warfarin and begin IV heparin. INR 1.51, LDH wnl, CBC wnl.   Initial heparin level is low at 0.19 on 1200 units/hr. No bleeding or IV issues noted.   Goal of Therapy:  Heparin level 0.3-0.7 units/ml Monitor platelets by anticoagulation protocol: Yes   Plan:  -Increase IV heparin to 1400 units/hr -Check 6hr heparin level -Daily heparin level, CBC, INR, LDH  Sheppard Coil PharmD., BCPS Clinical Pharmacist 02/06/2018 8:07 PM

## 2018-02-06 NOTE — Progress Notes (Addendum)
Patient presents in VAD clinic today with 10/10 pain at his drive line site. He reports no trauma has happened with his drive line. Upon assessment abdomen is extremely tender in the LLQ tracking around drive line. Hot to touch. Slight redness noted tracking along drive line. Current drive line dressing changed (see drive line portion of note.)   Per Dr. [name]; admit for IV antibiotics, CT abdomen with contrast to r/o abscess, 500cc NS bolus, blood and wound cultures.    Vital Signs:  Temp: 98.6 Doppler Pressure:  76 Automatc BP: 73/56 (62) HR:  104 SPO2: 96% on RA  Weight: 207.8 lbs  w/ eqt Last weight: 208.4 lb   VAD Indication: Destination Therapy   VAD interrogation & Equipment Management: Speed: 5600 Flow: 5.2 Power: 4.3w    PI: 2.4 Hct: 38 Alarms:  Few low voltage  Events: 10 - 20 PI events   Fixed speed 5600 Low speed limit: 5300  Primary Controller:  Replace back up battery in 29 months. Back up controller:   Replace back up battery in 29 months.  Annual Equipment Maintenance on UBC/PM was performed on 4/19.   I reviewed the LVAD parameters from today and compared the results to the patient's prior recorded data. LVAD interrogation was NEGATIVE for significant power changes, NEGATIVE for clinical alarms and STABLE for PI events/speed drops. No programming changes were made and pump is functioning within specified parameters. Pt is performing daily controller and system monitor self tests along with completing weekly and monthly maintenance for LVAD equipment.  LVAD equipment check completed and is in good working order. Back-up equipment present.   Exit Site Care: Drive line is being maintaine twice weekly to weekly by [employer]. Existing VAD dressing removed and site care performed using sterile technique. Drive line exit site cleaned with Chlora prep applicators x 2, allowed to dry, and gauze dressing with silver strip re-applied. Drive line exit site  incorporated, the velour is fully implanted at exit site. No erythema or odor; moderate amount of serous drainage. Stabilization device present and accurately applied. Pt denies fever or chills. Wound culture sent. No tunneling noted.   Significant Events on VAD Support:   Device:none  BP & Labs:  Doppler 76 - reflecting modified systolic  Hgb 12.1 - No S/S of bleeding. Specifically denies melena/BRBPR or nosebleeds.  LDH stable at 190 with established baseline of 200-300. Denies tea-colored urine. No power elevations noted on interrogation.   Patient Instructions:  1. Will admit to 2H.     Alyce Pagan RN VAD Coordinator  Office: [phone]  24/7 Pager: [phone]

## 2018-02-06 NOTE — Progress Notes (Addendum)
ANTICOAGULATION CONSULT NOTE - Initial Consult  Pharmacy Consult for heparin Indication: LVAD  No Known Allergies  Patient Measurements:   Heparin Dosing Weight: 96kg  Vital Signs: Temp: 98.6 F (37 C) (09/23 0849) Temp Source: Oral (09/23 0849) BP: 73/56 (09/23 0850) Pulse Rate: 104 (09/23 0849)  Labs: Recent Labs    02/03/18 1059 02/06/18 0835  HGB  --  13.0  HCT  --  41.7  PLT  --  174  LABPROT 16.5* 18.1*  INR 1.35 1.51  CREATININE  --  1.05    Estimated Creatinine Clearance: 104.4 mL/min (by C-G formula based on SCr of 1.05 mg/dL).   Medical History: Past Medical History:  Diagnosis Date  . Asthma   . CHF (congestive heart failure) (HCC)    a. 09/2016: EF 20-25% with cath showing normal cors  . GERD (gastroesophageal reflux disease)   . History of hiatal hernia      Assessment: 28 yoM with LVAD admitted with likely driveline infection. INR subtherapeutic on admission and pt may ultimately require I&D, so pharmacy consulted to hold warfarin and begin IV heparin. INR 1.51, LDH wnl, CBC wnl.   Goal of Therapy:  Heparin level 0.3-0.7 units/ml Monitor platelets by anticoagulation protocol: Yes   Plan:  -Start IV heparin 1200 units/hr -Check 6hr heparin level -Daily heparin level, CBC, INR, LDH  Fredonia Highland, PharmD, BCPS Clinical Pharmacist [phone] Please check AMION for all [facility] Pharmacy numbers 02/06/2018

## 2018-02-07 LAB — CBC
HEMATOCRIT: 36.8 % — AB (ref 39.0–52.0)
Hemoglobin: 11.2 g/dL — ABNORMAL LOW (ref 13.0–17.0)
MCH: 27.5 pg (ref 26.0–34.0)
MCHC: 30.4 g/dL (ref 30.0–36.0)
MCV: 90.2 fL (ref 78.0–100.0)
PLATELETS: 156 10*3/uL (ref 150–400)
RBC: 4.08 MIL/uL — ABNORMAL LOW (ref 4.22–5.81)
RDW: 15.1 % (ref 11.5–15.5)
WBC: 15.3 10*3/uL — AB (ref 4.0–10.5)

## 2018-02-07 LAB — BASIC METABOLIC PANEL
ANION GAP: 8 (ref 5–15)
BUN: 10 mg/dL (ref 6–20)
CALCIUM: 8 mg/dL — AB (ref 8.9–10.3)
CO2: 21 mmol/L — AB (ref 22–32)
Chloride: 102 mmol/L (ref 98–111)
Creatinine, Ser: 0.93 mg/dL (ref 0.61–1.24)
Glucose, Bld: 90 mg/dL (ref 70–99)
Potassium: 4 mmol/L (ref 3.5–5.1)
Sodium: 131 mmol/L — ABNORMAL LOW (ref 135–145)

## 2018-02-07 LAB — LACTATE DEHYDROGENASE: LDH: 190 U/L (ref 98–192)

## 2018-02-07 LAB — PROTIME-INR
INR: 1.72
Prothrombin Time: 20 seconds — ABNORMAL HIGH (ref 11.4–15.2)

## 2018-02-07 LAB — HEPARIN LEVEL (UNFRACTIONATED): Heparin Unfractionated: 0.37 IU/mL (ref 0.30–0.70)

## 2018-02-07 MED ORDER — WARFARIN SODIUM 7.5 MG PO TABS
7.5000 mg | ORAL_TABLET | Freq: Once | ORAL | Status: AC
  Administered 2018-02-07: 7.5 mg via ORAL
  Filled 2018-02-07: qty 1

## 2018-02-07 MED ORDER — WARFARIN - PHARMACIST DOSING INPATIENT
Freq: Every day | Status: DC
  Administered 2018-02-08: 18:00:00

## 2018-02-07 NOTE — Progress Notes (Signed)
ANTICOAGULATION CONSULT NOTE   Pharmacy Consult for heparin Indication: LVAD  No Known Allergies  Patient Measurements: Height: 6' 2.02" (188 cm) Weight: 207 lb 6.9 oz (94.1 kg) IBW/kg (Calculated) : 82.24 Heparin Dosing Weight: 96kg  Vital Signs: Temp: 98.4 F (36.9 C) (09/24 0810) Temp Source: Oral (09/24 0810) BP: 97/77 (09/24 0700) Pulse Rate: 92 (09/24 0700)  Labs: Recent Labs    02/06/18 0835 02/06/18 1830 02/07/18 0309  HGB 13.0  --  11.2*  HCT 41.7  --  36.8*  PLT 174  --  156  LABPROT 18.1*  --  20.0*  INR 1.51  --  1.72  HEPARINUNFRC  --  0.19* 0.37  CREATININE 1.05  --  0.93    Estimated Creatinine Clearance: 117.8 mL/min (by C-G formula based on SCr of 0.93 mg/dL).   Medical History: Past Medical History:  Diagnosis Date  . Asthma   . CHF (congestive heart failure) (HCC)    a. 09/2016: EF 20-25% with cath showing normal cors  . GERD (gastroesophageal reflux disease)   . History of hiatal hernia    Assessment: 4 yoM with LVAD admitted with possible driveline infection. INR subtherapeutic on admission and it was thought that pt may ultimately require I&D, so IV heparin started and coumadin held. Per TCTS, cellulitis of the abdominal wall soft tissue on CT - no abscess to drain, will restart coumadin 9/24.  9/24: Heparin level this morning therapeutic at 0.37 on 1400 units/hr. INR today subtherapeutic at 1.72 with dose held yesterday. PTA regimen 7.5mg  Sunday and Friday, 5mg  all other days. CBC wnl, no bleeding or IV issues noted.   Goal of Therapy:  Heparin level 0.3-0.7 units/ml Monitor platelets by anticoagulation protocol: Yes   Plan:  -Will give coumadin 7.5mg  tonight x1 (as dose missed yesterday) -Continue IV heparin to 1400 units/hr -Daily heparin level, CBC, INR, LDH  Thank you for involving pharmacy in this patient's care.  Wendelyn Breslow, PharmD PGY1 Pharmacy Resident Phone: [phone] 02/07/2018 8:33 AM

## 2018-02-07 NOTE — Progress Notes (Signed)
  Subjective: Patient examined and CT images reviewed Abdominal tenderness over power cord tunnel with cellulitis of abdominal wall soft tissue on CT - no abscess to drain Cont iv antibiotics in hospital,  cont coumadin normal dosing  Objective: Vital signs in last 24 hours: Temp:  [97.9 F (36.6 C)-101.6 F (38.7 C)] 98.5 F (36.9 C) (09/24 0406) Pulse Rate:  [82-196] 87 (09/24 0500) Resp:  [15-30] 15 (09/24 0600) BP: (72-99)/(0-78) 96/74 (09/24 0500) SpO2:  [94 %-100 %] 98 % (09/24 0500) Weight:  [93.8 kg-94.3 kg] 94.1 kg (09/24 0500)  Hemodynamic parameters for last 24 hours:    Intake/Output from previous day: 09/23 0701 - 09/24 0700 In: 1408.8 [I.V.:208.6; IV Piggyback:1200.2] Out: -  Intake/Output this shift: No intake/output data recorded.  Mild abdominal wall tenderness Normal VAD hum  Lab Results: Recent Labs    02/06/18 0835 02/07/18 0309  WBC 11.8* 15.3*  HGB 13.0 11.2*  HCT 41.7 36.8*  PLT 174 156   BMET:  Recent Labs    02/06/18 0835 02/07/18 0309  NA 133* 131*  K 4.3 4.0  CL 104 102  CO2 20* 21*  GLUCOSE 123* 90  BUN 12 10  CREATININE 1.05 0.93  CALCIUM 8.8* 8.0*    PT/INR:  Recent Labs    02/07/18 0309  LABPROT 20.0*  INR 1.72   ABG    Component Value Date/Time   PHART 7.405 09/08/2017 0049   HCO3 23.4 09/08/2017 0049   TCO2 32 09/12/2017 1605   ACIDBASEDEF 1.0 09/08/2017 0049   O2SAT 60.9 09/19/2017 0445   CBG (last 3)  No results for input(s): GLUCAP in the last 72 hours.  Assessment/Plan: S/P HM3 with powercord tunnel cellulitis    LOS: 1 day    Jonathan Wood 02/07/2018

## 2018-02-07 NOTE — Plan of Care (Signed)

## 2018-02-07 NOTE — Progress Notes (Addendum)
Advanced Heart Failure VAD Team Note  PCP-Cardiologist: No primary care provider on file.   Subjective:    Tmax 101.6 F. Remains on Vanc Cefepime.   CT ABD/Pelvis 02/06/18 with cellulitis and no abscess to drain  Feeling good this am. Has been walking halls. Denies lightheadedness or dizziness. Still with pain around drive line site. No drainage.   LVAD Interrogation HM 3: Speed: 5600 Flow: 4.9 PI: 2.8 Power: 4.0. No PI events   Objective:    Vital Signs:   Temp:  [97.9 F (36.6 C)-101.6 F (38.7 C)] 98.4 F (36.9 C) (09/24 0810) Pulse Rate:  [82-196] 92 (09/24 0700) Resp:  [15-30] 21 (09/24 0700) BP: (72-99)/(0-78) 97/77 (09/24 0700) SpO2:  [94 %-100 %] 98 % (09/24 0700) Weight:  [93.8 kg-94.3 kg] 94.1 kg (09/24 0500) Last BM Date: 02/07/18 Mean arterial Pressure 80s  Intake/Output:   Intake/Output Summary (Last 24 hours) at 02/07/2018 0819 Last data filed at 02/07/2018 0500 Gross per 24 hour  Intake 1408.83 ml  Output -  Net 1408.83 ml     Physical Exam    General:  Well appearing. NAD  HEENT: normal Neck: supple. JVP not elevated. Carotids 2+ bilat; no bruits. No lymphadenopathy or thyromegaly appreciated. Cor: Mechanical heart sounds with LVAD hum present. Lungs: clear no wheeze Abdomen: soft, nondistended. No hepatosplenomegaly. No bruits or masses. +BS Driveline: C/D/I; securement device intact and driveline incorporated. Driveline area slightly tender and warm, but improving. No drainage Extremities: no cyanosis, clubbing, rash, edema Neuro: alert & oriented x 3, cranial nerves grossly intact. moves all 4 extremities w/o difficulty. Affect pleasant   Telemetry   NSR/ST 90-100s, personally reviewed.   EKG    No new tracings.    Labs   Basic Metabolic Panel: Recent Labs  Lab 02/06/18 0835 02/07/18 0309  NA 133* 131*  K 4.3 4.0  CL 104 102  CO2 20* 21*  GLUCOSE 123* 90  BUN 12 10  CREATININE 1.05 0.93  CALCIUM 8.8* 8.0*    Liver  Function Tests: No results for input(s): AST, ALT, ALKPHOS, BILITOT, PROT, ALBUMIN in the last 168 hours. No results for input(s): LIPASE, AMYLASE in the last 168 hours. No results for input(s): AMMONIA in the last 168 hours.  CBC: Recent Labs  Lab 02/06/18 0835 02/07/18 0309  WBC 11.8* 15.3*  HGB 13.0 11.2*  HCT 41.7 36.8*  MCV 89.3 90.2  PLT 174 156    INR: Recent Labs  Lab 02/03/18 1059 02/06/18 0835 02/07/18 0309  INR 1.35 1.51 1.72    Other results:  EKG:    Imaging   Ct Abdomen Pelvis W Contrast  Result Date: 02/06/2018 CLINICAL DATA:  Abdominal pain and fever, possible abscess EXAM: CT ABDOMEN AND PELVIS WITH CONTRAST TECHNIQUE: Multidetector CT imaging of the abdomen and pelvis was performed using the standard protocol following bolus administration of intravenous contrast. CONTRAST:  OMNIPAQUE IOHEXOL 300 MG/ML  SOLN COMPARISON:  Abdominal ultrasound 09/12/2017 FINDINGS: Lower chest: A left ventricular assist device is present. Moderate cardiomegaly. Hepatobiliary: Unremarkable Pancreas: Unremarkable Spleen: Unremarkable Adrenals/Urinary Tract: There is contrast medium in the collecting systems on the initial images, which less in sensitivity for nonobstructive renal calculi. No hydronephrosis or hydroureter. Urinary bladder unremarkable. Complex lesion of the left kidney upper pole measures 1.3 by 1.6 cm on image 6/8. This has slightly ill-defined margins, but previously had a more cystic appearance. In particular on 07/25/2012 there is a benign appearing renal cyst in this location. Two left and  a single right should hypodense renal lesion are technically too small to characterize although statistically likely to be benign. Stomach/Bowel: Unremarkable Vascular/Lymphatic: Aortoiliac atherosclerotic vascular disease. 9 mm left external iliac lymph node. Reproductive: Unremarkable Other: The left ventricular assist device driveline extends substernally out to the  subcutaneous tissues and skin surface. There is some mild stranding in the subcutaneous tissues around the drive line, and a thin margin of potential fluid density measuring up to about 4 mm in thickness around the tube. I do not see any definite gas tracking in the soft tissues. No separate drainable abscess is identified. Musculoskeletal: Prior median sternotomy. IMPRESSION: 1. There is some fluid density and stranding around the subcutaneous portion of the LVAD driveline. Much of this may be normal/incidental. There are no specific guidelines to indicate a certain thickness of fluid density or granulation tissue at which infection should be suspected. I do not see a separate abscess or gas tracking in the soft tissues. 2. Moderate cardiomegaly. 3. A complex lesion of the left kidney upper pole is likely a collapsed/complex cyst. There is a larger simple appearing cyst in this location back in 2014. Surveillance may be warranted. 4.  Aortic Atherosclerosis (ICD10-I70.0). Electronically Signed   By: Gaylyn Rong M.D.   On: 02/06/2018 14:02      Medications:     Scheduled Medications: . amiodarone  200 mg Oral Daily  . aspirin EC  81 mg Oral Daily  . citalopram  10 mg Oral Daily  . gabapentin  300 mg Oral TID  . pantoprazole  40 mg Oral Daily  . sildenafil  20 mg Oral TID     Infusions: . ceFEPime (MAXIPIME) IV Stopped (02/06/18 2244)  . heparin 1,400 Units/hr (02/07/18 0735)  . vancomycin Stopped (02/07/18 0117)     PRN Medications:  acetaminophen, ondansetron (ZOFRAN) IV, oxyCODONE-acetaminophen, traMADol   Patient Profile   KAEDIN HICKLIN a 47 y.o.malewith h/o 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   Presented to clinic 02/06/18 with 10/10 pain at his driveline site. Noted to have erythema and calor. Admitted for further evaluation and treatment.   Assessment/Plan:    1. 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  - Tmax 101.6. WBC up to 15.3. - BCx pending.  - CT ABD/Pelvis W Contrast with cellulitis. No obvious abscess.  - Continue Vanc/Cefepime empirically until BCx result.   2.Chronic systolic HF - EF 16% s/p HM-3 LVAD on 09/06/17 - Otherwise, has been doing wellwith VAD support. - Volume status stable to dry.  -Holding Entresto with soft pressures. Continue to hold at least this am with fever overnight.  -Have begun referral process to [facility] for transplant. Continue routine cotinine screening.He has been off cigarettes since before VAD implant.  Reinforced fluid restriction to < 2 L daily, sodium restriction to less than 2000 mg daily, and the importance of daily weights.    3. HM3 LVAD 09/06/2017. - VAD interrogated personally. Parameters stable.   - LDH 190 - INR goal 2.0-2.5. INR 1.72. On heparin. Coumadin resume. Dosing per pharm-D.  - Continue ASA.  -Blood pressure well controlled. Continue current regimen.  4. Anxiety - Much improved on Celexa.  - No change to current plan.    5. Essential HTN - MAPS in 80s. Will resume Entresto as he stabilizes. Hold this am with fever overnight.   OK to go to [facility] if bed available.  I reviewed the LVAD parameters from  today, and compared the results to the patient's prior recorded data.  No programming changes were made.  The LVAD is functioning within specified parameters.  The patient performs LVAD self-test daily.  LVAD interrogation was negative for any significant power changes, alarms or PI events/speed drops.  LVAD equipment check completed and is in good working order.  Back-up equipment present.   LVAD education done on emergency procedures and precautions and reviewed exit site care.  Length of Stay: 1  Luane School 02/07/2018, 8:19 AM  VAD Team --- VAD ISSUES ONLY--- Pager [phone] (7am - 7am)  Advanced Heart Failure Team  Pager [phone] (M-F; 7a - 4p)  Please contact [facility]  for night-coverage after hours (4p -7a ) and weekends on [email or web address]  Patient seen and examined with the above-signed Advanced Practice Provider and/or Housestaff. I personally reviewed laboratory data, imaging studies and relevant notes. I independently examined the patient and formulated the important aspects of the plan. I have edited the note to reflect any of my changes or salient points. I have personally discussed the plan with the patient and/or family.  Driveline site still swollen and mildly tender. CT reviewed personally and has significant soft tissue stranding along tunnel and anterior ab walk. No abscess to drain. WBC up and remains febrile to 101. Will continue IV abx. Dr. Donata Clay has seen and no need for I&D at this point. MAP ok. VAD interrogated personally. Parameters stable. INR 1.7. Discussed dosing with PharmD personally.  Arvilla Meres, MD  8:56 AM

## 2018-02-08 ENCOUNTER — Encounter (HOSPITAL_COMMUNITY): Payer: Medicaid Other

## 2018-02-08 LAB — AEROBIC CULTURE  (SUPERFICIAL SPECIMEN)

## 2018-02-08 LAB — BASIC METABOLIC PANEL
Anion gap: 9 (ref 5–15)
BUN: 11 mg/dL (ref 6–20)
CHLORIDE: 102 mmol/L (ref 98–111)
CO2: 22 mmol/L (ref 22–32)
CREATININE: 0.89 mg/dL (ref 0.61–1.24)
Calcium: 8.4 mg/dL — ABNORMAL LOW (ref 8.9–10.3)
GFR calc non Af Amer: >60 mL/min (ref >60)
GLUCOSE: 88 mg/dL (ref 70–99)
POTASSIUM: 4.1 mmol/L (ref 3.5–5.1)
Sodium: 133 mmol/L — ABNORMAL LOW (ref 135–145)

## 2018-02-08 LAB — CBC
HCT: 33 % — ABNORMAL LOW (ref 39.0–52.0)
HEMOGLOBIN: 10.2 g/dL — AB (ref 13.0–17.0)
MCH: 27.8 pg (ref 26.0–34.0)
MCHC: 30.9 g/dL (ref 30.0–36.0)
MCV: 89.9 fL (ref 78.0–100.0)
PLATELETS: 156 10*3/uL (ref 150–400)
RBC: 3.67 MIL/uL — AB (ref 4.22–5.81)
RDW: 14.7 % (ref 11.5–15.5)
WBC: 8.9 10*3/uL (ref 4.0–10.5)

## 2018-02-08 LAB — AEROBIC CULTURE W GRAM STAIN (SUPERFICIAL SPECIMEN)
Culture: NO GROWTH
Gram Stain: NONE SEEN

## 2018-02-08 LAB — PROTIME-INR
INR: 1.63
PROTHROMBIN TIME: 19.2 s — AB (ref 11.4–15.2)

## 2018-02-08 LAB — VANCOMYCIN, TROUGH: Vancomycin Tr: 11 ug/mL — ABNORMAL LOW (ref 15–20)

## 2018-02-08 LAB — LACTATE DEHYDROGENASE: LDH: 146 U/L (ref 98–192)

## 2018-02-08 LAB — HEPARIN LEVEL (UNFRACTIONATED): Heparin Unfractionated: 0.48 IU/mL (ref 0.30–0.70)

## 2018-02-08 MED ORDER — WARFARIN SODIUM 7.5 MG PO TABS
7.5000 mg | ORAL_TABLET | Freq: Once | ORAL | Status: AC
  Administered 2018-02-08: 7.5 mg via ORAL
  Filled 2018-02-08: qty 1

## 2018-02-08 MED ORDER — VANCOMYCIN HCL 10 G IV SOLR
1750.0000 mg | Freq: Two times a day (BID) | INTRAVENOUS | Status: DC
  Administered 2018-02-09 (×2): 1750 mg via INTRAVENOUS
  Filled 2018-02-08 (×3): qty 1750

## 2018-02-08 NOTE — Progress Notes (Addendum)
Advanced Heart Failure VAD Team Note  PCP-Cardiologist: No primary care provider on file.   Subjective:    Afebrile now. Remains on Vanc/Cefepime.   CT ABD/Pelvis 02/06/18 with cellulitis and no abscess to drain  Feeling "great" today. Walking halls with no SOB, lightheadedness, or dizziness. Abdominal pain greatly improved.   LVAD Interrogation HM 3: Speed: 5600 Flow: 4.9 PI: 2.6 Power: 4.0. No PI events   Objective:    Vital Signs:   Temp:  [97.6 F (36.4 C)-98.9 F (37.2 C)] 98.1 F (36.7 C) (09/25 0400) Pulse Rate:  [30-88] 30 (09/24 1400) Resp:  [11-31] 23 (09/25 0700) BP: (66-123)/(54-91) 95/60 (09/25 0000) SpO2:  [92 %-99 %] 99 % (09/25 0400) Weight:  [93.7 kg] 93.7 kg (09/25 0500) Last BM Date: 02/07/18 Mean arterial Pressure 80-90s  Intake/Output:   Intake/Output Summary (Last 24 hours) at 02/08/2018 0759 Last data filed at 02/08/2018 0600 Gross per 24 hour  Intake 3038.91 ml  Output 300 ml  Net 2738.91 ml     Physical Exam    General: Well appearing this am. NAD.  HEENT: Normal. Neck: Supple, JVP 7-8 cm. Carotids OK.  Cardiac:  Mechanical heart sounds with LVAD hum present.  Lungs:  CTAB, normal effort.  Abdomen:  NT, ND, no HSM. No bruits or masses. +BS  LVAD exit site: Dressing dry and intact. No drainage. Stabilization device present and accurately applied. Driveline dressing changed daily per sterile technique. No longer tender or hot to the touch.  Extremities:  Warm and dry. No cyanosis, clubbing, rash, or edema.  Neuro:  Alert & oriented x 3. Cranial nerves grossly intact. Moves all 4 extremities w/o difficulty. Affect pleasant     Telemetry   NSR 70-80s , personally reviewed.   EKG    No new tracings.    Labs   Basic Metabolic Panel: Recent Labs  Lab 02/06/18 0835 02/07/18 0309 02/08/18 0222  NA 133* 131* 133*  K 4.3 4.0 4.1  CL 104 102 102  CO2 20* 21* 22  GLUCOSE 123* 90 88  BUN 12 10 11   CREATININE 1.05 0.93 0.89  CALCIUM  8.8* 8.0* 8.4*    Liver Function Tests: No results for input(s): AST, ALT, ALKPHOS, BILITOT, PROT, ALBUMIN in the last 168 hours. No results for input(s): LIPASE, AMYLASE in the last 168 hours. No results for input(s): AMMONIA in the last 168 hours.  CBC: Recent Labs  Lab 02/06/18 0835 02/07/18 0309 02/08/18 0222  WBC 11.8* 15.3* 8.9  HGB 13.0 11.2* 10.2*  HCT 41.7 36.8* 33.0*  MCV 89.3 90.2 89.9  PLT 174 156 156    INR: Recent Labs  Lab 02/03/18 1059 02/06/18 0835 02/07/18 0309 02/08/18 0222  INR 1.35 1.51 1.72 1.63    Other results:     Imaging   Ct Abdomen Pelvis W Contrast  Result Date: 02/06/2018 CLINICAL DATA:  Abdominal pain and fever, possible abscess EXAM: CT ABDOMEN AND PELVIS WITH CONTRAST TECHNIQUE: Multidetector CT imaging of the abdomen and pelvis was performed using the standard protocol following bolus administration of intravenous contrast. CONTRAST:  OMNIPAQUE IOHEXOL 300 MG/ML  SOLN COMPARISON:  Abdominal ultrasound 09/12/2017 FINDINGS: Lower chest: A left ventricular assist device is present. Moderate cardiomegaly. Hepatobiliary: Unremarkable Pancreas: Unremarkable Spleen: Unremarkable Adrenals/Urinary Tract: There is contrast medium in the collecting systems on the initial images, which less in sensitivity for nonobstructive renal calculi. No hydronephrosis or hydroureter. Urinary bladder unremarkable. Complex lesion of the left kidney upper pole measures 1.3 by  1.6 cm on image 6/8. This has slightly ill-defined margins, but previously had a more cystic appearance. In particular on 07/25/2012 there is a benign appearing renal cyst in this location. Two left and a single right should hypodense renal lesion are technically too small to characterize although statistically likely to be benign. Stomach/Bowel: Unremarkable Vascular/Lymphatic: Aortoiliac atherosclerotic vascular disease. 9 mm left external iliac lymph node. Reproductive: Unremarkable Other:  The left ventricular assist device driveline extends substernally out to the subcutaneous tissues and skin surface. There is some mild stranding in the subcutaneous tissues around the drive line, and a thin margin of potential fluid density measuring up to about 4 mm in thickness around the tube. I do not see any definite gas tracking in the soft tissues. No separate drainable abscess is identified. Musculoskeletal: Prior median sternotomy. IMPRESSION: 1. There is some fluid density and stranding around the subcutaneous portion of the LVAD driveline. Much of this may be normal/incidental. There are no specific guidelines to indicate a certain thickness of fluid density or granulation tissue at which infection should be suspected. I do not see a separate abscess or gas tracking in the soft tissues. 2. Moderate cardiomegaly. 3. A complex lesion of the left kidney upper pole is likely a collapsed/complex cyst. There is a larger simple appearing cyst in this location back in 2014. Surveillance may be warranted. 4.  Aortic Atherosclerosis (ICD10-I70.0). Electronically Signed   By: Gaylyn Rong M.D.   On: 02/06/2018 14:02     Medications:     Scheduled Medications: . amiodarone  200 mg Oral Daily  . aspirin EC  81 mg Oral Daily  . citalopram  10 mg Oral Daily  . gabapentin  300 mg Oral TID  . pantoprazole  40 mg Oral Daily  . sildenafil  20 mg Oral TID  . Warfarin - Pharmacist Dosing Inpatient   Does not apply q1800    Infusions: . ceFEPime (MAXIPIME) IV Stopped (02/07/18 2151)  . heparin 1,400 Units/hr (02/08/18 0400)  . vancomycin Stopped (02/08/18 0128)    PRN Medications: acetaminophen, ondansetron (ZOFRAN) IV, oxyCODONE-acetaminophen, traMADol   Patient Profile   Jonathan Wood a 47 y.o.malewith h/o 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   Presented to clinic 02/06/18 with 10/10 pain at his  driveline site. Noted to have erythema and calor. Admitted for further evaluation and treatment.   Assessment/Plan:    1. 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  - Tmax 98.9. WBC trending down.  - BCx NGTD.  - CT ABD/Pelvis W Contrast with cellulitis. No obvious abscess.  - Continue Vanc/Cefepime empirically until BCx result. Will discuss narrowing with MD  2.Chronic systolic HF - EF 16% s/p HM-3 LVAD on 09/06/17 - Otherwise, has been doing wellwith VAD support. - Volume status stable.  - Holding [place]. May be able to resume today.  -Have begun referral process to [facility] for transplant. Continue routine cotinine screening.He has been off cigarettes since before VAD implant.   3. HM3 LVAD 09/06/2017. - VAD interrogated personally. Parameters stable.   - LDH 146 - INR goal 2.0-2.5. INR 1.63. On heparin. Back on coumadin. Discussed dosing with PharmD personally.  - Continue ASA.  -Blood pressure well controlled. Continue current regimen.  4. Anxiety - Much improved on Celexa.  - No change to current plan.   5. Essential HTN - MAPs 80-90s. Will discuss resumption of Entresto with MD.   OK for stepdown when  bed available.   I reviewed the LVAD parameters from today, and compared the results to the patient's prior recorded data.  No programming changes were made.  The LVAD is functioning within specified parameters.  The patient performs LVAD self-test daily.  LVAD interrogation was negative for any significant power changes, alarms or PI events/speed drops.  LVAD equipment check completed and is in good working order.  Back-up equipment present.   LVAD education done on emergency procedures and precautions and reviewed exit site care.   Length of Stay: 2  Luane School 02/08/2018, 7:59 AM  VAD Team --- VAD ISSUES ONLY--- Pager [phone] (7am - 7am)  Advanced Heart Failure Team  Pager [phone] (M-F; 7a - 4p)  Please contact [facility] for night-coverage after  hours (4p -7a ) and weekends on [email or web address]   Patient seen and examined with the above-signed Advanced Practice Provider and/or Housestaff. I personally reviewed laboratory data, imaging studies and relevant notes. I independently examined the patient and formulated the important aspects of the plan. I have edited the note to reflect any of my changes or salient points. I have personally discussed the plan with the patient and/or family.  Driveline site improving on IV abx. Afebrile. WBC has normalized. I have reviewed CT scan with Dr. Donata Clay and does have evidence of significant soft tissue involvement/cellulitis but no evidence of asbscess or drainable fluid collection. Would continue IV abx for at least a few more days and then can probably switch to po. Remains on heparin with INR 1.63. Ok to resume coumadin. Discussed dosing with PharmD personally. VAD interrogated personally. Parameters stable.  Arvilla Meres, MD  8:56 AM

## 2018-02-08 NOTE — Discharge Summary (Addendum)
Advanced Heart Failure Team  Discharge Summary   Patient ID: Jonathan Wood MRN: [id number], DOB/AGE: [birth year] 45 y.o. Admit date: 02/06/2018 D/C date:     02/09/2018    Primary Discharge Diagnoses:  1. LVAD Driveline infection.  2.Chronic systolic HF 3. HM3 LVAD 09/06/2017. 4. Anxiety 5. Essential HTN 6. Hyponatremia  Hospital Course:  Jonathan Wood is a 47 y.o. 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  Presented to clinic 02/06/18 with 10/10 pain at his driveline site. States it started on 02/03/18 and gradually worsened. Noted to have erythema and calor. Admitted for further evaluation and treatment.Blood cultures sent and started empirically on vanc/cefepime. CT ABD performed 02/06/18 which showed cellulitic changes with stranding, with no abscess to drain. Labs on admission included K 4.3, Cr 1.05, WBC 11.8, Hgb 13.0, INR 1.51.      Pt febrile overnight to 101.6, but then trended down and remained afebrile on IV ABX. WBC peaked at 15.3 and trended back down. Blood cultures were negative. Discussed ABX regimen with ID NP, Rexene Alberts who recommended narrowing to Augmentin to complete 7-10 day course once patient stable for discharge.   Pt examined am of 02/09/2018 and determined stable for home. He will complete course of ABX and have close follow up as below. From HF perspective he remained stable.   INR dropped to 1.6. He will use lovenox injection until he has INR checked on Monday at [facility] and follow up in the VAD clinic next week Friday.    LVAD Interrogation HM 3: Speed:5600 Flow: 4.9 PI: 2.6 Power: 4   Discharge Weight: 210 pounds.  Discharge Vitals: Blood pressure 117/62, pulse 86, temperature 98.3 F (36.8 C), temperature source Oral, resp. rate (!) 21, height 6' 2.02" (1.88 m), weight 95.3 kg, SpO2 100 %.  Labs: Lab Results  Component Value Date   WBC 6.7 02/09/2018    HGB 9.7 (L) 02/09/2018   HCT 31.8 (L) 02/09/2018   MCV 89.6 02/09/2018   PLT 164 02/09/2018    Recent Labs  Lab 02/09/18 0228  NA 135  K 4.2  CL 106  CO2 24  BUN 12  CREATININE 0.90  CALCIUM 8.3*  GLUCOSE 83   Lab Results  Component Value Date   CHOL 111 08/30/2017   HDL 33 (L) 08/30/2017   LDLCALC 70 08/30/2017   TRIG 38 08/30/2017   BNP (last 3 results) Recent Labs    08/25/17 1337 09/07/17 0411 09/13/17 0348  BNP 2,510.0* 272.7* 233.6*    ProBNP (last 3 results) No results for input(s): PROBNP in the last 8760 hours.   Diagnostic Studies/Procedures   No results found.  Discharge Medications   Allergies as of 02/09/2018   No Known Allergies     Medication List    TAKE these medications   amiodarone 200 MG tablet Commonly known as:  PACERONE 200 mg daily What changed:    how much to take  how to take this  when to take this  additional instructions   amoxicillin-clavulanate 875-125 MG tablet Commonly known as:  AUGMENTIN Take 1 tablet by mouth 2 (two) times daily for 6 days. Start taking on:  02/10/2018   aspirin EC 81 MG tablet Take 1 tablet (81 mg total) by mouth daily.   citalopram 10 MG tablet Commonly known as:  CELEXA Take 1 tablet (10 mg total) by mouth daily.   enoxaparin 40  MG/0.4ML injection Commonly known as:  LOVENOX Inject 0.4 mLs (40 mg total) into the skin every 12 (twelve) hours.   gabapentin 300 MG capsule Commonly known as:  NEURONTIN Take 1 capsule (300 mg total) by mouth 3 (three) times daily. What changed:  Another medication with the same name was removed. Continue taking this medication, and follow the directions you see here.   pantoprazole 40 MG tablet Commonly known as:  PROTONIX Take 1 tablet (40 mg total) by mouth daily.   potassium chloride SA 20 MEQ tablet Commonly known as:  K-DUR,KLOR-CON Take 1 tablet (20 mEq total) by mouth daily. What changed:    how much to take  how to take this  when  to take this  additional instructions   sacubitril-valsartan 49-51 MG Commonly known as:  ENTRESTO Take 1 tablet by mouth 2 (two) times daily.   sildenafil 20 MG tablet Commonly known as:  REVATIO Take 1 tablet (20 mg total) by mouth 3 (three) times daily.   traMADol 50 MG tablet Commonly known as:  ULTRAM Take 1 tablet (50 mg total) by mouth every 6 (six) hours as needed. What changed:    how much to take  when to take this  reasons to take this  additional instructions   warfarin 5 MG tablet Commonly known as:  COUMADIN Take as directed. If you are unsure how to take this medication, talk to your nurse or doctor. Original instructions:  Take 5-7.5 mg by mouth See admin instructions. Take 1 1/2 tablets (7.5 mg) by mouth on Wednesday and Friday at 6pm, take 1 tablet (5 mg) on Sunday, Monday, Tuesday, Thursday, Saturday at 6pm       Disposition   The patient will be discharged in stable condition to home. Discharge Instructions    (HEART FAILURE PATIENTS) Call MD:  Anytime you have any of the following symptoms: 1) 3 pound weight gain in 24 hours or 5 pounds in 1 week 2) shortness of breath, with or without a dry hacking cough 3) swelling in the hands, feet or stomach 4) if you have to sleep on extra pillows at night in order to breathe.   Complete by:  As directed    Diet - low sodium heart healthy   Complete by:  As directed    Heart Failure patients record your daily weight using the same scale at the same time of day   Complete by:  As directed    INR  Goal: 2 - 2.5   Complete by:  As directed    Goal:  2 - 2.5   Increase activity slowly   Complete by:  As directed    Page VAD Coordinator at [phone]  Notify for: any VAD alarms, sustained elevations of power >10 watts, sustained drop in Pulse Index <3   Complete by:  As directed    Notify for:   any VAD alarms sustained elevations of power >10 watts sustained drop in Pulse Index <3     STOP any activity  that causes chest pain, shortness of breath, dizziness, sweating, or exessive weakness   Complete by:  As directed    Speed Settings:   Complete by:  As directed    Fixed 5600 RPM Low 5300 RPM     Follow-up Information    Veneda Kirksey, Bevelyn Buckles, MD Follow up on 02/17/2018.   Specialty:  Cardiology Why:  at [employer] information: [address] [place] [address] [phone]  Duration of Discharge Encounter: Greater than 35 minutes   Signed, Tonye Becket, NP  02/09/2018, 2:45 PM    Patient seen and examined with the above-signed Advanced Practice Provider and/or Housestaff. I personally reviewed laboratory data, imaging studies and relevant notes. I independently examined the patient and formulated the important aspects of the plan. I have edited the note to reflect any of my changes or salient points. I have personally discussed the plan with the patient and/or family.  Driveline site much improved. No longer tender. Afebrile. WBC down. VAD interrogated personally. Parameters stable. HF stable. Would give IV abx today and then can d/c home and switch to Augmentin to complete 10-day course of abx. Discussed with ID and Dr. Donata Clay.  Arvilla Meres, MD  6:17 PM

## 2018-02-08 NOTE — Progress Notes (Signed)
Pharmacy Antibiotic Note  Jonathan Wood is a 47 y.o. 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  Pharmacy has been consulted for vancomycin and cefepime dosing. Pt will not require I&D; treating for cellulitis. SCr continues at baseline, currently on vancomycin 1250mg  q12h, patient continues to clinically improve, 9/25 vancomycin trough resulted at 11.  Plan: Continue cefepime 2g IV q12h Increase vancomycin to 1750mg  IV q12h Monitor cultures, LOT, renal function   Temp (24hrs), Avg:98.1 F (36.7 C), Min:97.6 F (36.4 C), Max:98.9 F (37.2 C)  Recent Labs  Lab 02/06/18 0835 02/07/18 0309 02/08/18 0222 02/08/18 1130  WBC 11.8* 15.3* 8.9  --   CREATININE 1.05 0.93 0.89  --   VANCOTROUGH  --   --   --  11*    Estimated Creatinine Clearance: 123.1 mL/min (by C-G formula based on SCr of 0.89 mg/dL).    No Known Allergies  Antimicrobials this admission: Vancomycin 9/23 >>  Cefepime 9/23 >>   Thank you for involving pharmacy in this patient's care.  Wendelyn Breslow, PharmD PGY1 Pharmacy Resident Phone: [phone] 02/08/2018 2:29 PM

## 2018-02-08 NOTE — Progress Notes (Signed)
No noted PI events, no alarms. MAP 80-90 via cuff and doppler

## 2018-02-08 NOTE — Progress Notes (Addendum)
ANTICOAGULATION CONSULT NOTE   Pharmacy Consult for heparin Indication: LVAD  No Known Allergies  Patient Measurements: Height: 6' 2.02" (188 cm) Weight: 206 lb 8 oz (93.7 kg) IBW/kg (Calculated) : 82.24 Heparin Dosing Weight: 96kg  Vital Signs: Temp: 98.1 F (36.7 C) (09/25 0400) Temp Source: Oral (09/25 0400) BP: 95/60 (09/25 0000)  Labs: Recent Labs    02/06/18 0835 02/06/18 1830 02/07/18 0309 02/08/18 0222  HGB 13.0  --  11.2* 10.2*  HCT 41.7  --  36.8* 33.0*  PLT 174  --  156 156  LABPROT 18.1*  --  20.0* 19.2*  INR 1.51  --  1.72 1.63  HEPARINUNFRC  --  0.19* 0.37 0.48  CREATININE 1.05  --  0.93 0.89    Estimated Creatinine Clearance: 123.1 mL/min (by C-G formula based on SCr of 0.89 mg/dL).   Medical History: Past Medical History:  Diagnosis Date  . Asthma   . CHF (congestive heart failure) (HCC)    a. 09/2016: EF 20-25% with cath showing normal cors  . GERD (gastroesophageal reflux disease)   . 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 INR subtherapeutic on admission and it was thought that pt may ultimately require I&D, so IV heparin started and coumadin held. Per TCTS, cellulitis of the abdominal wall soft tissue on CT - no abscess to drain, restarted coumadin 9/24.  9/25: Heparin level this morning therapeutic at 0.48 on 1400 units/hr. INR today continues to be subtherapeutic at 1.63 with dose held 9/23. PTA regimen 7.5mg  Sunday and Friday, 5mg  all other days. Hgb 10.2, plt 156; will continue to trend. No bleeding or IV issues noted.  Goal of Therapy:  Heparin level 0.3-0.7 units/ml Monitor platelets by anticoagulation protocol: Yes   Plan:  -Will give coumadin 7.5mg  again tonight x1 -Continue IV heparin to 1400 units/hr -Daily heparin level, CBC, INR, LDH  Thank you for involving pharmacy in this patient's care.  Wendelyn Breslow, PharmD PGY1 Pharmacy Resident Phone: [phone] 02/08/2018 7:34 AM

## 2018-02-09 LAB — CBC
HEMATOCRIT: 31.8 % — AB (ref 39.0–52.0)
Hemoglobin: 9.7 g/dL — ABNORMAL LOW (ref 13.0–17.0)
MCH: 27.3 pg (ref 26.0–34.0)
MCHC: 30.5 g/dL (ref 30.0–36.0)
MCV: 89.6 fL (ref 78.0–100.0)
Platelets: 164 10*3/uL (ref 150–400)
RBC: 3.55 MIL/uL — ABNORMAL LOW (ref 4.22–5.81)
RDW: 14.7 % (ref 11.5–15.5)
WBC: 6.7 10*3/uL (ref 4.0–10.5)

## 2018-02-09 LAB — BASIC METABOLIC PANEL
Anion gap: 5 (ref 5–15)
BUN: 12 mg/dL (ref 6–20)
CHLORIDE: 106 mmol/L (ref 98–111)
CO2: 24 mmol/L (ref 22–32)
Calcium: 8.3 mg/dL — ABNORMAL LOW (ref 8.9–10.3)
Creatinine, Ser: 0.9 mg/dL (ref 0.61–1.24)
GFR calc Af Amer: >60 mL/min (ref >60)
GFR calc non Af Amer: >60 mL/min (ref >60)
GLUCOSE: 83 mg/dL (ref 70–99)
POTASSIUM: 4.2 mmol/L (ref 3.5–5.1)
Sodium: 135 mmol/L (ref 135–145)

## 2018-02-09 LAB — LACTATE DEHYDROGENASE: LDH: 160 U/L (ref 98–192)

## 2018-02-09 LAB — PROTIME-INR
INR: 1.62
Prothrombin Time: 19.1 seconds — ABNORMAL HIGH (ref 11.4–15.2)

## 2018-02-09 LAB — HEPARIN LEVEL (UNFRACTIONATED): HEPARIN UNFRACTIONATED: 0.39 [IU]/mL (ref 0.30–0.70)

## 2018-02-09 MED ORDER — SODIUM CHLORIDE 0.9 % IV SOLN
2.0000 g | Freq: Two times a day (BID) | INTRAVENOUS | Status: DC
  Administered 2018-02-09 (×2): 2 g via INTRAVENOUS
  Filled 2018-02-09 (×2): qty 2

## 2018-02-09 MED ORDER — ENOXAPARIN SODIUM 40 MG/0.4ML ~~LOC~~ SOLN: 40.0000 mg | Freq: Two times a day (BID) | SUBCUTANEOUS | Status: DC

## 2018-02-09 MED ORDER — GABAPENTIN 300 MG PO CAPS: 300.0000 mg | ORAL_CAPSULE | Freq: Three times a day (TID) | ORAL | 6 refills | Status: DC

## 2018-02-09 MED ORDER — SACUBITRIL-VALSARTAN 49-51 MG PO TABS
1.0000 | ORAL_TABLET | Freq: Two times a day (BID) | ORAL | Status: DC
  Administered 2018-02-09: 1 via ORAL
  Filled 2018-02-09 (×2): qty 1

## 2018-02-09 MED ORDER — POTASSIUM CHLORIDE CRYS ER 20 MEQ PO TBCR: 20.0000 meq | EXTENDED_RELEASE_TABLET | Freq: Every day | ORAL | 3 refills | Status: DC

## 2018-02-09 MED ORDER — WARFARIN SODIUM 7.5 MG PO TABS
7.5000 mg | ORAL_TABLET | Freq: Once | ORAL | Status: AC
  Administered 2018-02-09: 7.5 mg via ORAL
  Filled 2018-02-09: qty 1

## 2018-02-09 MED ORDER — ENOXAPARIN SODIUM 40 MG/0.4ML ~~LOC~~ SOLN
40.0000 mg | Freq: Two times a day (BID) | SUBCUTANEOUS | Status: DC
  Administered 2018-02-09: 40 mg via SUBCUTANEOUS
  Filled 2018-02-09: qty 0.4

## 2018-02-09 MED ORDER — WARFARIN SODIUM 7.5 MG PO TABS: 7.5000 mg | ORAL_TABLET | Freq: Once | ORAL | Status: DC

## 2018-02-09 MED ORDER — AMOXICILLIN-POT CLAVULANATE 875-125 MG PO TABS: 1.0000 | ORAL_TABLET | Freq: Two times a day (BID) | ORAL | 0 refills | Status: AC

## 2018-02-09 MED ORDER — TRAMADOL HCL 50 MG PO TABS: 50.0000 mg | ORAL_TABLET | Freq: Four times a day (QID) | ORAL | 0 refills | Status: DC | PRN

## 2018-02-09 NOTE — Progress Notes (Signed)
VAD drive line care:  Existing VAD dressing removed and site care performed using sterile technique. Drive line exit site cleaned with Chlora prep applicators x 2, allowed to dry, and gauze dressing with silver strip re-applied. Exit site healed and partially incorporated, the velour is fully implanted at exit site. Small amount of yellow drainage. No redness, tenderness, foul odor or rash noted. Drive line anchor re-applied. Continue twice a week dressing changes. Next change due Monday 02/13/18.       Carlton Adam RN, BSN VAD Coordinator 24/7 Pager [phone]

## 2018-02-09 NOTE — Progress Notes (Signed)
CSW met with patient at bedside. Patient well known to CSW through [facility]. Patient still waiting on disability review and determination. CSW will continue to work with patient through the outpatient [place] clinic. Patient getting ready for discharge home today. [name], [place], [place]

## 2018-02-09 NOTE — Plan of Care (Signed)

## 2018-02-09 NOTE — Progress Notes (Addendum)
Verbal and written discharge instructions given to patient. All questions answered. All personal belongings returned to patient. PIVs removed per discharge protocol. Written prescriptions given to patient. Patient transported by CNA via wheelchair to valet station for discharge.

## 2018-02-09 NOTE — Progress Notes (Addendum)
Advanced Heart Failure VAD Team Note  PCP-Cardiologist: No primary care provider on file.   Subjective:    CT ABD/Pelvis 02/06/18 with cellulitis and no abscess to drain  Remains on vanc and cefepime. Blood CX no growth for 3 days.  INR 1.6 . On heparin drip.   Wants to home. Denies SOB.   LVAD Interrogation HM 3: Speed: 5600 Flow: 4.3  PI5.5  Power:4.1 No PI events.   Objective:    Vital Signs:   Temp:  [97.7 F (36.5 C)-99.1 F (37.3 C)] 97.9 F (36.6 C) (09/26 0300) Pulse Rate:  [76-79] 76 (09/26 0300) Resp:  [15-25] 23 (09/26 0300) BP: (98-123)/(75-99) 123/97 (09/26 0300) SpO2:  [98 %-99 %] 98 % (09/26 0300) Weight:  [95.3 kg] 95.3 kg (09/26 0300) Last BM Date: 02/08/18 Mean arterial Pressure 90-100s   Intake/Output:   Intake/Output Summary (Last 24 hours) at 02/09/2018 0731 Last data filed at 02/09/2018 0400 Gross per 24 hour  Intake 2057.41 ml  Output 1250 ml  Net 807.41 ml     Physical Exam    Physical Exam: GENERAL: Sitting on the side of the bed. NAD. Marland Kitchen HEENT: normal  NECK: Supple, JVP  6-7.  No lymphadenopathy or thyromegaly appreciated.   CARDIAC:  Mechanical heart sounds with LVAD hum present.  LUNGS:  Clear to auscultation bilaterally.  ABDOMEN:  Soft, round, nontender, positive bowel sounds x4.     LVAD exit site: Dressing dry and intact.  No erythema or drainage.  Stabilization device present and accurately applied.  Driveline dressing is being changed daily per sterile technique. EXTREMITIES:  Warm and dry, no cyanosis, clubbing, rash or edema  NEUROLOGIC:  Alert and oriented x 4.  Gait steady.  No aphasia.  No dysarthria.  Affect pleasant.       Telemetry   NSt 80s personally reviewed.   EKG    No new tracings.    Labs   Basic Metabolic Panel: Recent Labs  Lab 02/06/18 0835 02/07/18 0309 02/08/18 0222 02/09/18 0228  NA 133* 131* 133* 135  K 4.3 4.0 4.1 4.2  CL 104 102 102 106  CO2 20* 21* 22 24  GLUCOSE 123* 90 88 83  BUN  12 10 11 12   CREATININE 1.05 0.93 0.89 0.90  CALCIUM 8.8* 8.0* 8.4* 8.3*    Liver Function Tests: No results for input(s): AST, ALT, ALKPHOS, BILITOT, PROT, ALBUMIN in the last 168 hours. No results for input(s): LIPASE, AMYLASE in the last 168 hours. No results for input(s): AMMONIA in the last 168 hours.  CBC: Recent Labs  Lab 02/06/18 0835 02/07/18 0309 02/08/18 0222 02/09/18 0228  WBC 11.8* 15.3* 8.9 6.7  HGB 13.0 11.2* 10.2* 9.7*  HCT 41.7 36.8* 33.0* 31.8*  MCV 89.3 90.2 89.9 89.6  PLT 174 156 156 164    INR: Recent Labs  Lab 02/03/18 1059 02/06/18 0835 02/07/18 0309 02/08/18 0222 02/09/18 0228  INR 1.35 1.51 1.72 1.63 1.62    Other results:     Imaging   No results found.   Medications:     Scheduled Medications: . amiodarone  200 mg Oral Daily  . aspirin EC  81 mg Oral Daily  . citalopram  10 mg Oral Daily  . gabapentin  300 mg Oral TID  . pantoprazole  40 mg Oral Daily  . sildenafil  20 mg Oral TID  . Warfarin - Pharmacist Dosing Inpatient   Does not apply q1800    Infusions: . ceFEPime (MAXIPIME) IV  2 g (02/08/18 2215)  . heparin 1,400 Units/hr (02/09/18 0000)  . vancomycin 1,750 mg (02/09/18 0012)    PRN Medications: acetaminophen, ondansetron (ZOFRAN) IV, oxyCODONE-acetaminophen, traMADol   Patient Profile   Jonathan Wood a 47 y.o.malewith h/o 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   Presented to clinic 02/06/18 with 10/10 pain at his driveline site. Noted to have erythema and calor. Admitted for further evaluation and treatment.   Assessment/Plan:    1. 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  - Afebrile. WBC down to 6.7   - BCx NGTD x 3 days.  - CT ABD/Pelvis W Contrast with cellulitis. No obvious abscess.  - Continue Vanc/Cefepime empirically and he will go home today on augmentin for 7 days.   2.Chronic systolic HF - EF 71% s/p HM-3 LVAD on  09/06/17 - Otherwise, has been doing wellwith VAD support. - Volume status stable.  - Resume entresto 49-51 mg twice a day.   -Have begun referral process to [facility] for [facility]. Continue routine cotinine screening.He has been off cigarettes since before VAD implant.  - Renal function stable.   3. HM3 LVAD 09/06/2017. - VAD interrogated personally. Parameters stable.   - LDH stable 160  - INR goal 2.0-2.5. INR  1.62. Marland Kitchen He will need lovenox at home.  Continue heparin until INR 1.8. Continue coumadin.  - Continue ASA.  -Elevated Maps.   4. Anxiety - Much improved on Celexa.  - No change to current plan.   5. Essential HTN - Elevated.   Restart entresto today.   He is anxious to go home. Discussed with Dr [name]. Plan to give IV antibiotics today and discharge later today. He will need to return to clinic Monday for INR check.     I reviewed the LVAD parameters from today, and compared the results to the patient's prior recorded data.  No programming changes were made.  The LVAD is functioning within specified parameters.  The patient performs LVAD self-test daily.  LVAD interrogation was negative for any significant power changes, alarms or PI events/speed drops.  LVAD equipment check completed and is in good working order.  Back-up equipment present.   LVAD education done on emergency procedures and precautions and reviewed exit site care.   Length of Stay: 3  Amy Clegg, NP 02/09/2018, 7:31 AM  VAD Team --- VAD ISSUES ONLY--- Pager [phone] (7am - 7am)  Advanced Heart Failure Team  Pager [phone] (M-F; 7a - 4p)  Please contact [facility] for night-coverage after hours (4p -7a ) and weekends on [email or web address]  Patient seen and examined with the above-signed Advanced Practice Provider and/or Housestaff. I personally reviewed laboratory data, imaging studies and relevant notes. I independently examined the patient and formulated the important aspects of the plan. I have  edited the note to reflect any of my changes or salient points. I have personally discussed the plan with the patient and/or family.  Driveline site much improved. No longer tender. Afebrile. WBC down. VAD interrogated personally. Parameters stable. HF stable. Would give IV abx today and then can d/c home and switch to Augmentin to complete 10-day course of abx. Discussed with ID and Dr. Donata Clay.  Arvilla Meres, MD  6:16 PM

## 2018-02-09 NOTE — Progress Notes (Addendum)
ANTICOAGULATION CONSULT NOTE   Pharmacy Consult for Heparin + Warfarin Indication: LVAD  No Known Allergies  Patient Measurements: Height: 6' 2.02" (188 cm) Weight: 210 lb 1.6 oz (95.3 kg) IBW/kg (Calculated) : 82.24 Heparin Dosing Weight: 96kg  Vital Signs: Temp: 97.9 F (36.6 C) (09/26 0300) Temp Source: Oral (09/26 0300) BP: 123/97 (09/26 0300) Pulse Rate: 76 (09/26 0300)  Labs: Recent Labs    02/07/18 0309 02/08/18 0222 02/09/18 0228  HGB 11.2* 10.2* 9.7*  HCT 36.8* 33.0* 31.8*  PLT 156 156 164  LABPROT 20.0* 19.2* 19.1*  INR 1.72 1.63 1.62  HEPARINUNFRC 0.37 0.48 0.39  CREATININE 0.93 0.89 0.90    Estimated Creatinine Clearance: 121.8 mL/min (by C-G formula based on SCr of 0.9 mg/dL).   Medical History: Past Medical History:  Diagnosis Date  . Asthma   . CHF (congestive heart failure) (HCC)    a. 09/2016: EF 20-25% with cath showing normal cors  . GERD (gastroesophageal reflux disease)   . 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 INR subtherapeutic on admission and it was thought that pt may ultimately require I&D, so IV heparin started and coumadin held. Per TCTS, cellulitis of the abdominal wall soft tissue on CT - no abscess to drain, restarted coumadin 9/24.  Heparin level remains therapeutic, INR stagnant at 1.62 2/2 holding a dose on admit. Hgb trending down slightly.   Pt to be discharged home today. Pt reports he has LMWH 40mg  syringes at home for bridging over the weekend. Recommend giving 7.5mg  tonight, then resuming normal regimen of 7.5mg  Wed/Fri and 5mg  all other days. Continue LMWH 40mg  SQ BID through the weekend. Will stop IV heparin this afternoon and give a dose of LMWH prior to D/C.  *PTA Dose = 7.5mg  Wed/Fri, 5mg  AODs  Goal of Therapy:  Heparin level 0.3-0.7 units/ml  INR 2.0-2.5 Monitor platelets by anticoagulation protocol: Yes   Plan:  -Warfarin 7.5mg  PO x1 tonight then  resume regimen of 7.5mg  Wed/Fri, 5mg  AODs -Continue heparin until D/C then give LMWH 40mg  SQ x1 and continue LMWH through the weekend (40mg  SQ BID) -INR check on Monday  Fredonia Highland, PharmD, BCPS Clinical Pharmacist [phone] Please check AMION for all [facility] numbers 02/09/2018

## 2018-02-09 NOTE — Progress Notes (Signed)
LVAD Coordinator Rounding Note:  Admitted 02/06/18 per Dr. [name] due to suspected drive line infection.   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 Darcey Nora under Destination Therapy criteria.  Vital signs: Temp: 98.3 HR: 86 Doppler Pressure: 84 Automatic BP: 109/89 (96) O2 Sat: 99% RA Wt: 206.7>207.4>206.5>210.1 lbs   LVAD interrogation reveals:  Speed: 5600 Flow: 4.7 Power:  4.3w PI: 4.3 Alarms: none Events:  12 PI events Hematocrit: 33 Fixed speed: 5600 Low speed limit: 5300  Drive Line: left abdominal drive line dressing dry and intact; anchor intact and accurately applied. Twice weekly dressing changes using daily dressing kit. Bedside nurse may perform dressing changes. Next dressing change due 02/09/18.  Labs:  LDH trend:  190>190>146>160>  INR trend: 1.51>1.72>1.63>1.62  Anticoagulation Plan: -INR Goal: 2.0 - 2.5 -ASA Dose: 81 mg daily - Heparin gtt while INR subtherapeutic per pharmacy  Device: none  Infection:  - 02/06/18 DL site culture - NTD - 02/06/18 blood cultures x 2 - NTD  Adverse Events on VAD: -  Plan/Recommendations:  1. Planned discharge home today - pt needs to speak with [name], LCSW. Kennyth Lose will see pt before he is      discharged home at @ 5pm. 2. Last dose of IV antibiotics this afternoon, then pt can go home. 3. Pt INR subtherapeutic, will go home on Lovenox. Pt reports he has supply of Lovenox at home. He will get INR checked at [facility] lab on Monday, 02/13/18. 4. Return to [facility] on 02/17/18 at 11:30.

## 2018-02-10 ENCOUNTER — Encounter (HOSPITAL_COMMUNITY): Payer: Medicaid Other

## 2018-02-11 LAB — CULTURE, BLOOD (ROUTINE X 2)
CULTURE: NO GROWTH
Culture: NO GROWTH
SPECIAL REQUESTS: ADEQUATE
Special Requests: ADEQUATE

## 2018-02-13 ENCOUNTER — Encounter (HOSPITAL_COMMUNITY): Payer: Medicaid Other

## 2018-02-13 ENCOUNTER — Encounter (HOSPITAL_COMMUNITY)
Admission: RE | Admit: 2018-02-13 | Discharge: 2018-02-13 | Disposition: A | Discharge status: Home / Self Care | Payer: Medicaid Other | Source: Ambulatory Visit | Attending: Internal Medicine | Admitting: Internal Medicine

## 2018-02-13 ENCOUNTER — Ambulatory Visit (HOSPITAL_COMMUNITY): Payer: Self-pay | Admitting: Pharmacist

## 2018-02-13 DIAGNOSIS — Z7901 Long term (current) use of anticoagulants: Secondary | ICD-10-CM | POA: Diagnosis present

## 2018-02-13 DIAGNOSIS — Z95811 Presence of heart assist device: Secondary | ICD-10-CM

## 2018-02-13 LAB — PROTIME-INR
INR: 2.52
PROTHROMBIN TIME: 26.9 s — AB (ref 11.4–15.2)

## 2018-02-15 ENCOUNTER — Telehealth: Payer: Self-pay | Admitting: Licensed Clinical Social Worker

## 2018-02-15 ENCOUNTER — Encounter (HOSPITAL_COMMUNITY): Payer: Medicaid Other

## 2018-02-15 NOTE — Telephone Encounter (Signed)
CSW contcated patient to inform of upcoming men's group. Patient states interest in attending and provided information about date and time. Patient also asked about his pending disability application and requested CSW to call [employer]. CSW spoke with Ms Benjiman Core @ [employer] who states that patient's case is pending on the MD reviewer. She instructed CSW to have patient follow up with [facility] office for status in two weeks. CSW relayed information back to patient. CSW will follow up with patient on next clinic visit. Lasandra Beech, LCSW, CCSW-MCS [phone]

## 2018-02-17 ENCOUNTER — Encounter (HOSPITAL_COMMUNITY): Payer: Medicaid Other

## 2018-02-20 ENCOUNTER — Encounter (HOSPITAL_COMMUNITY): Payer: Self-pay

## 2018-02-20 ENCOUNTER — Ambulatory Visit (HOSPITAL_COMMUNITY)
Admission: RE | Admit: 2018-02-20 | Discharge: 2018-02-20 | Disposition: A | Discharge status: Home / Self Care | Payer: Medicaid Other | Source: Ambulatory Visit | Attending: Internal Medicine | Admitting: Internal Medicine

## 2018-02-20 ENCOUNTER — Ambulatory Visit (HOSPITAL_COMMUNITY): Payer: Self-pay | Admitting: Pharmacist

## 2018-02-20 ENCOUNTER — Other Ambulatory Visit (HOSPITAL_COMMUNITY): Payer: Self-pay | Admitting: *Deleted

## 2018-02-20 ENCOUNTER — Encounter (HOSPITAL_COMMUNITY): Payer: Medicaid Other

## 2018-02-20 ENCOUNTER — Telehealth (HOSPITAL_COMMUNITY): Payer: Self-pay | Admitting: *Deleted

## 2018-02-20 VITALS — BP 112/57 | HR 79 | Wt 215.8 lb

## 2018-02-20 DIAGNOSIS — K219 Gastro-esophageal reflux disease without esophagitis: Secondary | ICD-10-CM | POA: Diagnosis not present

## 2018-02-20 DIAGNOSIS — Z95811 Presence of heart assist device: Secondary | ICD-10-CM | POA: Insufficient documentation

## 2018-02-20 DIAGNOSIS — Z79891 Long term (current) use of opiate analgesic: Secondary | ICD-10-CM | POA: Diagnosis not present

## 2018-02-20 DIAGNOSIS — I10 Essential (primary) hypertension: Secondary | ICD-10-CM | POA: Diagnosis not present

## 2018-02-20 DIAGNOSIS — I110 Hypertensive heart disease with heart failure: Secondary | ICD-10-CM | POA: Diagnosis not present

## 2018-02-20 DIAGNOSIS — F17210 Nicotine dependence, cigarettes, uncomplicated: Secondary | ICD-10-CM | POA: Diagnosis not present

## 2018-02-20 DIAGNOSIS — T827XXA Infection and inflammatory reaction due to other cardiac and vascular devices, implants and grafts, initial encounter: Secondary | ICD-10-CM | POA: Diagnosis not present

## 2018-02-20 DIAGNOSIS — Z7901 Long term (current) use of anticoagulants: Secondary | ICD-10-CM | POA: Insufficient documentation

## 2018-02-20 DIAGNOSIS — E876 Hypokalemia: Secondary | ICD-10-CM | POA: Diagnosis not present

## 2018-02-20 DIAGNOSIS — Z7982 Long term (current) use of aspirin: Secondary | ICD-10-CM | POA: Insufficient documentation

## 2018-02-20 DIAGNOSIS — Z79899 Other long term (current) drug therapy: Secondary | ICD-10-CM | POA: Insufficient documentation

## 2018-02-20 DIAGNOSIS — I5022 Chronic systolic (congestive) heart failure: Secondary | ICD-10-CM | POA: Insufficient documentation

## 2018-02-20 DIAGNOSIS — F419 Anxiety disorder, unspecified: Secondary | ICD-10-CM | POA: Insufficient documentation

## 2018-02-20 DIAGNOSIS — J45909 Unspecified asthma, uncomplicated: Secondary | ICD-10-CM | POA: Diagnosis not present

## 2018-02-20 LAB — BASIC METABOLIC PANEL
ANION GAP: 9 (ref 5–15)
BUN: 10 mg/dL (ref 6–20)
CO2: 22 mmol/L (ref 22–32)
Calcium: 8.4 mg/dL — ABNORMAL LOW (ref 8.9–10.3)
Chloride: 106 mmol/L (ref 98–111)
Creatinine, Ser: 1.02 mg/dL (ref 0.61–1.24)
Glucose, Bld: 116 mg/dL — ABNORMAL HIGH (ref 70–99)
POTASSIUM: 3.1 mmol/L — AB (ref 3.5–5.1)
Sodium: 137 mmol/L (ref 135–145)

## 2018-02-20 LAB — CBC
HEMATOCRIT: 37.3 % — AB (ref 39.0–52.0)
Hemoglobin: 11.2 g/dL — ABNORMAL LOW (ref 13.0–17.0)
MCH: 27.6 pg (ref 26.0–34.0)
MCHC: 30 g/dL (ref 30.0–36.0)
MCV: 91.9 fL (ref 78.0–100.0)
Platelets: 213 10*3/uL (ref 150–400)
RBC: 4.06 MIL/uL — ABNORMAL LOW (ref 4.22–5.81)
RDW: 15.5 % (ref 11.5–15.5)
WBC: 7.2 10*3/uL (ref 4.0–10.5)

## 2018-02-20 LAB — PROTIME-INR
INR: 1.87
PROTHROMBIN TIME: 21.4 s — AB (ref 11.4–15.2)

## 2018-02-20 LAB — LACTATE DEHYDROGENASE: LDH: 254 U/L — AB (ref 98–192)

## 2018-02-20 MED ORDER — WARFARIN SODIUM 5 MG PO TABS: 5.0000 mg | ORAL_TABLET | ORAL | 3 refills | Status: DC

## 2018-02-20 NOTE — Progress Notes (Signed)
Cardiac Individual Treatment Plan  Patient Details  Name: Jonathan Wood MRN: [id number] Date of Birth: [birth year] Referring Provider:     CARDIAC REHAB PHASE II ORIENTATION from 02/03/2018 in [facility] CARDIAC REHABILITATION  Referring Provider  Bensimhon      Initial Encounter Date:    CARDIAC REHAB PHASE II ORIENTATION from 02/03/2018 in [facility] CARDIAC REHABILITATION  Date  02/03/18      Visit Diagnosis: CHF (congestive heart failure), NYHA class II, chronic, systolic (HCC)  LVAD (left ventricular assist device) present (HCC)  Patient's Home Medications on Admission:  Current Outpatient Medications:  .  amiodarone (PACERONE) 200 MG tablet, 200 mg daily (Patient taking differently: Take 200 mg by mouth daily. ), Disp: 36 tablet, Rfl: 3 .  aspirin EC 81 MG tablet, Take 1 tablet (81 mg total) by mouth daily., Disp: 30 tablet, Rfl: 1 .  citalopram (CELEXA) 10 MG tablet, Take 1 tablet (10 mg total) by mouth daily., Disp: 30 tablet, Rfl: 3 .  pantoprazole (PROTONIX) 40 MG tablet, Take 1 tablet (40 mg total) by mouth daily., Disp: 30 tablet, Rfl: 6 .  sacubitril-valsartan (ENTRESTO) 49-51 MG, Take 1 tablet by mouth 2 (two) times daily., Disp: 60 tablet, Rfl: 6 .  sildenafil (REVATIO) 20 MG tablet, Take 1 tablet (20 mg total) by mouth 3 (three) times daily., Disp: 90 tablet, Rfl: 3 .  enoxaparin (LOVENOX) 40 MG/0.4ML injection, Inject 0.4 mLs (40 mg total) into the skin every 12 (twelve) hours., Disp: , Rfl:  .  gabapentin (NEURONTIN) 300 MG capsule, Take 1 capsule (300 mg total) by mouth 3 (three) times daily., Disp: 90 capsule, Rfl: 6 .  potassium chloride SA (KLOR-CON M20) 20 MEQ tablet, Take 1 tablet (20 mEq total) by mouth daily. (Patient not taking: Reported on 02/20/2018), Disp: 90 tablet, Rfl: 3 .  traMADol (ULTRAM) 50 MG tablet, Take 1 tablet (50 mg total) by mouth every 6 (six) hours as needed., Disp: 20 tablet, Rfl: 0 .  warfarin (COUMADIN) 5 MG tablet, Take 1-1.5  tablets (5-7.5 mg total) by mouth See admin instructions. Take 1 1/2 tablets (7.5 mg) by mouth on Wednesday and Friday at 6pm, take 1 tablet (5 mg) on Sunday, Monday, Tuesday, Thursday, Saturday at 6pm, Disp: 180 tablet, Rfl: 3  Past Medical History: Past Medical History:  Diagnosis Date  . Asthma   . CHF (congestive heart failure) (HCC)    a. 09/2016: EF 20-25% with cath showing normal cors  . GERD (gastroesophageal reflux disease)   . History of hiatal hernia     Tobacco Use: Social History   Tobacco Use  Smoking Status Current Every Day Smoker  . Packs/day: 0.50  . Years: 25.00  . Pack years: 12.50  . Types: Cigarettes  Smokeless Tobacco Never Used  Tobacco Comment   quit 1 week    Labs: Recent Review Flowsheet Data    Labs for ITP Cardiac and Pulmonary Rehab Latest Ref Rng & Units 09/15/2017 09/16/2017 09/17/2017 09/18/2017 09/19/2017   Cholestrol 0 - 200 mg/dL - - - - -   LDLCALC 0 - 99 mg/dL - - - - -   HDL >40 mg/dL - - - - -   Trlycerides <150 mg/dL - - - - -   Hemoglobin A1c 4.8 - 5.6 % - - - - -   PHART 7.350 - 7.450 - - - - -   PCO2ART 32.0 - 48.0 mmHg - - - - -   HCO3 20.0 - 28.0  mmol/L - - - - -   TCO2 22 - 32 mmol/L - - - - -   ACIDBASEDEF 0.0 - 2.0 mmol/L - - - - -   O2SAT % 55.2 72.5 63.1 62.5 60.9      Capillary Blood Glucose: Lab Results  Component Value Date   GLUCAP 126 (H) 09/19/2017   GLUCAP 83 09/19/2017   GLUCAP 85 09/18/2017   GLUCAP 139 (H) 09/18/2017   GLUCAP 77 09/18/2017     Exercise Target Goals: Exercise Program Goal: Individual exercise prescription set using results from initial 6 min walk test and THRR while considering  patient's activity barriers and safety.   Exercise Prescription Goal: Starting with aerobic activity 30 plus minutes a day, 3 days per week for initial exercise prescription. Provide home exercise prescription and guidelines that participant acknowledges understanding prior to discharge.  Activity Barriers & Risk  Stratification: Activity Barriers & Cardiac Risk Stratification - 02/03/18 1038      Activity Barriers & Cardiac Risk Stratification   Activity Barriers  Assistive Device;Other (comment)    Comments  LVAD    Cardiac Risk Stratification  High       6 Minute Walk: 6 Minute Walk    Row Name 02/03/18 1018         6 Minute Walk   Phase  Initial     Distance  1600 feet     Walk Time  6 minutes     # of Rest Breaks  0     MPH  3.03     METS  3.32     RPE  12     Perceived Dyspnea   12     VO2 Peak  18.74     Symptoms  No     Resting HR  44 bpm     Resting BP  101/74 MAP 80     Resting Oxygen Saturation   97 %     Exercise Oxygen Saturation  during 6 min walk  97 %     Max Ex. HR  117 bpm     Max Ex. BP  157/134 MAP 80     2 Minute Post BP  83/51 MAP 62        Oxygen Initial Assessment:   Oxygen Re-Evaluation:   Oxygen Discharge (Final Oxygen Re-Evaluation):   Initial Exercise Prescription: Initial Exercise Prescription - 02/03/18 1000      Date of Initial Exercise RX and Referring Provider   Date  02/03/18    Referring Provider  Bensimhon    Expected Discharge Date  05/05/18      Treadmill   MPH  2    Grade  0    Minutes  17    METs  2.53      Recumbant Elliptical   Level  1    RPM  41    Watts  44    Minutes  17    METs  2.6      Prescription Details   Frequency (times per week)  3    Duration  Progress to 30 minutes of continuous aerobic without signs/symptoms of physical distress      Intensity   THRR 40-80% of Max Heartrate  97-123-150    Ratings of Perceived Exertion  11-13    Perceived Dyspnea  0-4      Progression   Progression  Continue to progress workloads to maintain intensity without signs/symptoms of physical distress.  Resistance Training   Training Prescription  Yes    Weight  1    Reps  10-15       Perform Capillary Blood Glucose checks as needed.  Exercise Prescription Changes:  Exercise Prescription Changes     Row Name 02/03/18 1000             Home Exercise Plan   Plans to continue exercise at  [facility] (comment) walking track        Frequency  Add 2 additional days to program exercise sessions.       Initial Home Exercises Provided  02/03/18          Exercise Comments:   Exercise Goals and Review:  Exercise Goals    Row Name 02/03/18 1040             Exercise Goals   Increase Physical Activity  Yes       Intervention  Provide advice, education, support and counseling about physical activity/exercise needs.;Develop an individualized exercise prescription for aerobic and resistive training based on initial evaluation findings, risk stratification, comorbidities and participant's personal goals.       Expected Outcomes  Short Term: Attend rehab on a regular basis to increase amount of physical activity.       Increase Strength and Stamina  Yes       Intervention  Provide advice, education, support and counseling about physical activity/exercise needs.;Develop an individualized exercise prescription for aerobic and resistive training based on initial evaluation findings, risk stratification, comorbidities and participant's personal goals.       Expected Outcomes  Short Term: Increase workloads from initial exercise prescription for resistance, speed, and METs.       Able to understand and use rate of perceived exertion (RPE) scale  Yes       Intervention  Provide education and explanation on how to use RPE scale       Expected Outcomes  Short Term: Able to use RPE daily in rehab to express subjective intensity level;Long Term:  Able to use RPE to guide intensity level when exercising independently       Able to understand and use Dyspnea scale  Yes       Intervention  Provide education and explanation on how to use Dyspnea scale       Expected Outcomes  Short Term: Able to use Dyspnea scale daily in rehab to express subjective sense of shortness of breath during exertion;Long  Term: Able to use Dyspnea scale to guide intensity level when exercising independently       Knowledge and understanding of Target Heart Rate Range (THRR)  Yes       Intervention  Provide education and explanation of THRR including how the numbers were predicted and where they are located for reference       Expected Outcomes  Short Term: Able to state/look up THRR;Long Term: Able to use THRR to govern intensity when exercising independently;Short Term: Able to use daily as guideline for intensity in rehab       Understanding of Exercise Prescription  Yes       Intervention  Provide education, explanation, and written materials on patient's individual exercise prescription       Expected Outcomes  Short Term: Able to explain program exercise prescription;Long Term: Able to explain home exercise prescription to exercise independently          Exercise Goals Re-Evaluation :    Discharge Exercise Prescription (Final Exercise Prescription  Changes): Exercise Prescription Changes - 02/03/18 1000      Home Exercise Plan   Plans to continue exercise at  [facility] (comment)   walking track    Frequency  Add 2 additional days to program exercise sessions.    Initial Home Exercises Provided  02/03/18       Nutrition:  Target Goals: Understanding of nutrition guidelines, daily intake of sodium 1500mg , cholesterol 200mg , calories 30% from fat and 7% or less from saturated fats, daily to have 5 or more servings of fruits and vegetables.  Biometrics: Pre Biometrics - 02/03/18 1041      Pre Biometrics   Height  6\' 2"  (1.88 m)    Weight  96.4 kg    Waist Circumference  41 inches    Hip Circumference  39.5 inches    Waist to Hip Ratio  1.04 %    BMI (Calculated)  27.28    Triceps Skinfold  5 mm    % Body Fat  22.1 %    Grip Strength  29.5 kg    Flexibility  0 in    Single Leg Stand  34 seconds        Nutrition Therapy Plan and Nutrition Goals: Nutrition Therapy & Goals -  02/03/18 1103      Personal Nutrition Goals   Personal Goal #2  Patient has some areas to improve upon with his diet. He is eating a low sodium diet.     Additional Goals?  No       Nutrition Assessments: Nutrition Assessments - 02/03/18 1104      MEDFICTS Scores   Pre Score  68       Nutrition Goals Re-Evaluation:   Nutrition Goals Discharge (Final Nutrition Goals Re-Evaluation):   Psychosocial: Target Goals: Acknowledge presence or absence of significant depression and/or stress, maximize coping skills, provide positive support system. Participant is able to verbalize types and ability to use techniques and skills needed for reducing stress and depression.  Initial Review & Psychosocial Screening: Initial Psych Review & Screening - 02/03/18 1059      Initial Review   Current issues with  Current Depression;Current Stress Concerns   he said depression is very low. Just every now and again due to his recent health issues.   Source of Stress Concerns  Family   York Spaniel it is tough being a single father of 4 children at times.      Family Dynamics   Good Support System?  Yes      Barriers   Psychosocial barriers to participate in program  There are no identifiable barriers or psychosocial needs.      Screening Interventions   Interventions  Encouraged to exercise    Expected Outcomes  Short Term goal: Identification and review with participant of any Quality of Life or Depression concerns found by scoring the questionnaire.;Long Term goal: The participant improves quality of Life and PHQ9 Scores as seen by post scores and/or verbalization of changes       Quality of Life Scores: Quality of Life - 02/03/18 1042      Quality of Life   Select  Quality of Life      Quality of Life Scores   Health/Function Pre  21.93 %    Socioeconomic Pre  16 %    Psych/Spiritual Pre  24 %    Family Pre  28.5 %    GLOBAL Pre  21.91 %      Scores of 19  and below usually indicate a  poorer quality of life in these areas.  A difference of  2-3 points is a clinically meaningful difference.  A difference of 2-3 points in the total score of the Quality of Life Index has been associated with significant improvement in overall quality of life, self-image, physical symptoms, and general health in studies assessing change in quality of life.  PHQ-9: Recent Review Flowsheet Data    There is no flowsheet data to display.     Interpretation of Total Score  Total Score Depression Severity:  1-4 = Minimal depression, 5-9 = Mild depression, 10-14 = Moderate depression, 15-19 = Moderately severe depression, 20-27 = Severe depression   Psychosocial Evaluation and Intervention: Psychosocial Evaluation - 02/03/18 1101      Psychosocial Evaluation & Interventions   Interventions  Stress management education;Encouraged to exercise with the program and follow exercise prescription    Continue Psychosocial Services   Follow up required by staff       Psychosocial Re-Evaluation:   Psychosocial Discharge (Final Psychosocial Re-Evaluation):   Vocational Rehabilitation: Provide vocational rehab assistance to qualifying candidates.   Vocational Rehab Evaluation & Intervention: Vocational Rehab - 02/03/18 1105      Initial Vocational Rehab Evaluation & Intervention   Assessment shows need for Vocational Rehabilitation  Yes    Vocational Rehab Packet given to patient  --   Vocational Rehab coordinator will giv ehim his packet. during meeting.    Documents faxed to [facility] Dept of Vocational Rehabilitation  --   Will call vocational rehab coordinator      Education: Education Goals: Education classes will be provided on a weekly basis, covering required topics. Participant will state understanding/return demonstration of topics presented.  Learning Barriers/Preferences: Learning Barriers/Preferences - 02/03/18 1104      Learning Barriers/Preferences   Learning Barriers  None     Learning Preferences  Individual Instruction;Group Instruction;Verbal Instruction       Education Topics: Hypertension, Hypertension Reduction -Define heart disease and high blood pressure. Discus how high blood pressure affects the body and ways to reduce high blood pressure.   Exercise and Your Heart -Discuss why it is important to exercise, the FITT principles of exercise, normal and abnormal responses to exercise, and how to exercise safely.   Angina -Discuss definition of angina, causes of angina, treatment of angina, and how to decrease risk of having angina.   Cardiac Medications -Review what the following cardiac medications are used for, how they affect the body, and side effects that may occur when taking the medications.  Medications include Aspirin, Beta blockers, calcium channel blockers, ACE Inhibitors, angiotensin receptor blockers, diuretics, digoxin, and antihyperlipidemics.   Congestive Heart Failure -Discuss the definition of CHF, how to live with CHF, the signs and symptoms of CHF, and how keep track of weight and sodium intake.   Heart Disease and Intimacy -Discus the effect sexual activity has on the heart, how changes occur during intimacy as we age, and safety during sexual activity.   Smoking Cessation / COPD -Discuss different methods to quit smoking, the health benefits of quitting smoking, and the definition of COPD.   Nutrition I: Fats -Discuss the types of cholesterol, what cholesterol does to the heart, and how cholesterol levels can be controlled.   Nutrition II: Labels -Discuss the different components of food labels and how to read food label   Heart Parts/Heart Disease and PAD -Discuss the anatomy of the heart, the pathway of blood circulation through the heart,  and these are affected by heart disease.   Stress I: Signs and Symptoms -Discuss the causes of stress, how stress may lead to anxiety and depression, and ways to limit  stress.   Stress II: Relaxation -Discuss different types of relaxation techniques to limit stress.   Warning Signs of Stroke / TIA -Discuss definition of a stroke, what the signs and symptoms are of a stroke, and how to identify when someone is having stroke.   Knowledge Questionnaire Score: Knowledge Questionnaire Score - 02/03/18 1104      Knowledge Questionnaire Score   Pre Score  18/24       Core Components/Risk Factors/Patient Goals at Admission: Personal Goals and Risk Factors at Admission - 02/03/18 1108      Core Components/Risk Factors/Patient Goals on Admission    Weight Management  Weight Maintenance    Personal Goal Other  Yes    Personal Goal  Get back in shape to get heart transplant    Intervention  Attend CR 3 x week and supplement with home exercise 2 x week    Expected Outcomes  Reach goal of getting heart transplant       Core Components/Risk Factors/Patient Goals Review:    Core Components/Risk Factors/Patient Goals at Discharge (Final Review):    ITP Comments: ITP Comments    Row Name 02/20/18 1506           ITP Comments  Patient new to program. Plans to start Wednesday 02/22/18. 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 He plans to attend after he completed his antibiotic's. His infection has healed. Will continue to monitor for progress.           Comments: ITP REVIEW Patient new to program. Plans to start Wednesday 02/22/18. 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 He plans to attend after he completed his antibiotic's. His infection has healed. Will continue to monitor for progress

## 2018-02-20 NOTE — Addendum Note (Signed)
Encounter addended by: Suann Larry, RN on: 02/20/2018 3:08 PM  Actions taken: Visit Navigator Flowsheet section accepted, Sign clinical note

## 2018-02-20 NOTE — Addendum Note (Signed)
Encounter addended by: Bernita Raisin, RN on: 02/20/2018 2:59 PM  Actions taken: Sign clinical note

## 2018-02-20 NOTE — Progress Notes (Signed)
Patient presents in VAD clinic today for hospital follow up. Denies pain at drive line site.     Vital Signs:  Temp:  Doppler Pressure:  98 Automatc BP: 112/57 (81) HR:  79 SPO2: 96% on RA  Weight: 215.8 lbs  w/ eqt Last weight: 208.4 lb   VAD Indication: Destination Therapy   VAD interrogation & Equipment Management: Speed: 5600 Flow: 4.8 Power: 4.2w    PI: 3.5 Hct: 37 Alarms: No external power 02/20/18- states pump was accidentally unplugged when police officer came to door to inform him of his son's arrest.  Few low voltage also noted.   Events: 10 - 20 PI events   Fixed speed 5600 Low speed limit: 5300  Primary Controller:  Replace back up battery in 28 months. Back up controller:   Replace back up battery in 29 months.  Annual Equipment Maintenance on UBC/PM was performed on 4/19.   I reviewed the LVAD parameters from today and compared the results to the patient's prior recorded data. LVAD interrogation was NEGATIVE for significant power changes, NEGATIVE for clinical alarms and STABLE for PI events/speed drops. No programming changes were made and pump is functioning within specified parameters. Pt is performing daily controller and system monitor self tests along with completing weekly and monthly maintenance for LVAD equipment.  LVAD equipment check completed and is in good working order. Back-up equipment present.   Exit Site Care: Drive line is being maintaine twice weekly to weekly by [employer]. Existing VAD dressing removed and site care performed using sterile technique. Drive line exit site cleaned with Chlora prep applicators x 2, allowed to dry, and gauze dressing with silver strip re-applied. Drive line exit site incorporated, the velour is fully implanted at exit site. No erythema or odor; moderate amount of serous drainage. Stabilization device present and accurately applied. Pt denies fever or chills. Wound culture sent. No tunneling noted.    Significant Events on VAD Support:   Device:none  BP & Labs:  Doppler 76 - reflecting modified systolic  Hgb 12.1 - No S/S of bleeding. Specifically denies melena/BRBPR or nosebleeds.  LDH stable at 190 with established baseline of 200-300. Denies tea-colored urine. No power elevations noted on interrogation.   Patient Instructions:  1. Will admit to 2H.     Alyce Pagan RN VAD Coordinator  Office: [phone]  24/7 Pager: [phone]

## 2018-02-20 NOTE — Progress Notes (Signed)
CSW informed by VAD coordinator that patient in need of [employer] gift card for gas money.  CSW provided pt with gift card.  CSW will continue to follow in clinic and assist as needed  Burna Sis, LCSW Clinical Social Worker [phone]

## 2018-02-20 NOTE — Progress Notes (Signed)
Advanced Heart Failure VAD Clinic Note   Primary cardiologist: Harwani Primary HF: Dr. [name]  HPI:  Jonathan Wood is a 47 y.o. 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   Admitted 08/25/17 with ADHF and cardiogenic shock. Started on IV lasix and milrinone 0.125 mcg/kg/min with over 27 lbs of diuresis.   Echo showed EF 5-10%, with mild RV dilation, and mod to severe reduction in function.Case discussed with [facility] transplnt team but not candidate for transplant due to ongoing tobacco use. Underwent HM3 LVAD 09/06/17 without any immediate post-op complications. 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  Started on neurontin for pocket pain. Progressed will with PT and thought stable for home on discharge.  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 CT with soft-tissue stranding. No abscess. Improved with IV abx and finishe po abx.   Follow up for Heart Failure/LVAD:  He presents today for post-hospital follow up. Feels good. No fevers or chills. No further drainage or pain at driveline site. Denies orthopnea or PND. No fevers, chills or problems with driveline. No bleeding, melena or neuro symptoms. No VAD alarms. Taking all meds as prescribed. Had some volume overload and took one dose of lasix last week with improvement.    VAD Indication: Destination Therapy   VAD interrogation & Equipment Management: Speed: 5600 Flow: 4.8 Power: 4.2w PI: 3.5 Hct: 37 Alarms: No external power 02/20/18- states pump was accidentally unplugged when police officer came to door to inform him of his son's arrest.  Few low voltage also noted.   Events: 10 - 20 PI events   Fixed speed 5600 Low speed limit: 5300  Primary Controller: Replace back up battery in 28 months. Back up controller: Replace back up battery in 29 months.  Annual Equipment Maintenance on UBC/PM was  performed on 4/19.  Reports taking Coumadin as prescribed and adherence to anticoagulation based dietary restrictions.  Denies bright red blood per rectum or melena, no dark urine or hematuria.     I reviewed the LVAD parameters from today, and compared the results to the patient's prior recorded data.  No programming changes were made.  The LVAD is functioning within specified parameters.  The patient performs LVAD self-test daily.  LVAD interrogation was negative for any significant power changes, alarms or PI events/speed drops.  LVAD equipment check completed and is in good working order.  Back-up equipment present.   LVAD education done on emergency procedures and precautions and reviewed exit site care.    Past Medical History:  Diagnosis Date  . Asthma   . CHF (congestive heart failure) (HCC)    a. 09/2016: EF 20-25% with cath showing normal cors  . GERD (gastroesophageal reflux disease)   . History of hiatal hernia     Current Outpatient Medications  Medication Sig Dispense Refill  . amiodarone (PACERONE) 200 MG tablet 200 mg daily (Patient taking differently: Take 200 mg by mouth daily. ) 36 tablet 3  . aspirin EC 81 MG tablet Take 1 tablet (81 mg total) by mouth daily. 30 tablet 1  . citalopram (CELEXA) 10 MG tablet Take 1 tablet (10 mg total) by mouth daily. 30 tablet 3  . enoxaparin (LOVENOX) 40 MG/0.4ML injection Inject 0.4 mLs (40 mg total) into the skin every 12 (twelve) hours.    . gabapentin (NEURONTIN) 300 MG capsule Take 1 capsule (300 mg total) by  mouth 3 (three) times daily. 90 capsule 6  . pantoprazole (PROTONIX) 40 MG tablet Take 1 tablet (40 mg total) by mouth daily. 30 tablet 6  . sacubitril-valsartan (ENTRESTO) 49-51 MG Take 1 tablet by mouth 2 (two) times daily. 60 tablet 6  . sildenafil (REVATIO) 20 MG tablet Take 1 tablet (20 mg total) by mouth 3 (three) times daily. 90 tablet 3  . traMADol (ULTRAM) 50 MG tablet Take 1 tablet (50 mg total) by mouth every 6  (six) hours as needed. 20 tablet 0  . warfarin (COUMADIN) 5 MG tablet Take 1-1.5 tablets (5-7.5 mg total) by mouth See admin instructions. Take 1 1/2 tablets (7.5 mg) by mouth on Wednesday and Friday at 6pm, take 1 tablet (5 mg) on Sunday, Monday, Tuesday, Thursday, Saturday at 6pm 180 tablet 3  . potassium chloride SA (KLOR-CON M20) 20 MEQ tablet Take 1 tablet (20 mEq total) by mouth daily. (Patient not taking: Reported on 02/20/2018) 90 tablet 3   No current facility-administered medications for this encounter.    Patient has no known allergies.  Review of systems complete and found to be negative unless listed in HPI.    Vitals:   02/20/18 1106 02/20/18 1113  BP: (!) 98/0 (!) 112/57  Pulse: 79   Weight: 97.9 kg (215 lb 12.8 oz)      Wt Readings from Last 3 Encounters:  02/20/18 97.9 kg (215 lb 12.8 oz)  02/09/18 95.3 kg (210 lb 1.6 oz)  02/06/18 94.3 kg (207 lb 12.8 oz)     Vital Signs:  Temp:  Doppler Pressure:  98 Automatc BP: 112/57 (81) HR:  79 SPO2: 96% on RA  Weight: 215.8 lbs  w/ eqt Last weight: 208.4 lb   Physical Exam: General:  NAD.  HEENT: normal  Neck: supple. JVP not elevated.  Carotids 2+ bilat; no bruits. No lymphadenopathy or thryomegaly appreciated. Cor: LVAD hum.  Lungs: Clear. Abdomen: soft, nontender, non-distended. No hepatosplenomegaly. No bruits or masses. Good bowel sounds. Driveline site clean. Anchor in place.  Extremities: no cyanosis, clubbing, rash. Warm no edema  Neuro: alert & oriented x 3. No focal deficits. Moves all 4 without problem    ASSESSMENT AND PLAN:   1. 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 - counseled on need to avoid driveline trauma  2. Chronic systolic HF - EF 16% s/p HM-3 LVAD on 09/06/17 - Continues to do very well with VAD support. NYHA I  - Volume status looks good. Now off torsemide and just using Entresto. Volume status was mildly elevated last week but improved with one dose torsemide -  Continue Entresto 49/51 mg BID.   - Will begin referral process to [facility] for transplant. We discussed cotinine screening and he states that he has been near people who are smoking and he doesn't want to start yet. His breath smelled like cigarettes today and I suspect he is still smoking. Will start screening next month  - Reinforced fluid restriction to < 2 L daily, sodium restriction to less than 2000 mg daily, and the importance of daily weights.    3. HM3 LVAD 09/06/2017. - VAD interrogated personally. Parameters stable. - As above, driveline site improved - LDH 254 - INR goal 2.0-2.5. INR 1.87  Discussed dosing with PharmD personally. - Continue ASA.  - Blood pressure well controlled. Continue current regimen.  4. Hypokalemia - K 3.1. Take kcl 80 today then 20 daily.  5. Anxiety - Much improved on Celexa.  -  No change to current plan.    6. Essential HTN -MAPs well controlled. Continue current regimen.  Total time spent 35 minutes. Over half that time spent discussing above.    Arvilla Meres, MD  1:17 PM

## 2018-02-20 NOTE — Progress Notes (Signed)
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  Denies any issues with VAD drive line or equipment. States he is feeling much better. He said he had "bloating of his abdomen" so he took extra Lasix, and felt better.   K+ 3.1 today. Has not been taking daily. Per Dr. [name] instructed him to take today, then resume daily.    Vital Signs:  Temp: Doppler Pressure:  98 Automatc BP: 112/57 (81) HR:  79 SPO2: 96% on RA  Weight: 215.8 lbs  w/ eqt Last weight: 209 lb   VAD Indication: Destination Therapy   VAD interrogation & Equipment Management: Speed: 5600 Flow: 4.8 Power: 4.2w    PI: 3.5 Hct: 37 Alarms:  4 no external power 02/20/18 (accidentally disconnected when police came to door regarding son's arrest) few low voltage alarms.  Events: 20+ PI events   Fixed speed 5600 Low speed limit: 5300  Primary Controller:  Replace back up battery in 28 months. Back up controller:   Replace back up battery in 29 months.  Annual Equipment Maintenance on UBC/PM was performed on 4/19.   I reviewed the LVAD parameters from today and compared the results to the patient's prior recorded data. LVAD interrogation was NEGATIVE for significant power changes, NEGATIVE for clinical alarms and STABLE for PI events/speed drops. No programming changes were made and pump is functioning within specified parameters. Pt is performing daily controller and system monitor self tests along with completing weekly and monthly maintenance for LVAD equipment.  LVAD equipment check completed and is in good working order. Back-up equipment present.   Exit Site Care: Drive line is being maintaine twice weekly to weekly by [employer]. Existing VAD dressing removed and site care performed using sterile technique. Drive line exit site cleaned with Chlora prep applicators x 2, allowed to dry, and gauze dressing with silver strip re-applied. Drive line exit  site incorporated, the velour is fully implanted at exit site. No erythema or odor; moderate amount of serous drainage. Stabilization device present and accurately applied. Pt denies fever or chills. Wound culture sent. No tunneling noted. Advised to continue twice a week dressing changes.   Significant Events on VAD Support:   Device:none  BP & Labs:  Doppler 98 - reflecting modified systolic  Hgb 11.2- No S/S of bleeding. Specifically denies melena/BRBPR or nosebleeds.  LDH stable at 254 with established baseline of 200-300. Denies tea-colored urine. No power elevations noted on interrogation.   Patient Instructions:  1. Resume taking Potassium Chloride daily. (Take today) 2. Return to VAD clinic in 2 weeks.      Alyce Pagan RN VAD Coordinator  Office: [phone]  24/7 Pager: [phone]

## 2018-02-20 NOTE — Telephone Encounter (Signed)
Pt called this am asking to re-schedule clinic appt from this am to this afternoon. Re-scheduled to 2:00 pm per his request. He will call if unable to make appt.  Asked pt if he was still taking Lovenox injections as prescribed at hospital d/c. He says he "only had 6 doses" and reports his last dose was Thurs or Fri of last week. Emphasized importance of at least getting labs today if unable to make clinic visit. Pt verbalized understanding of same.  Hessie Diener RN, VAD Coordinator [phone]

## 2018-02-22 ENCOUNTER — Other Ambulatory Visit (HOSPITAL_COMMUNITY): Payer: Self-pay | Admitting: *Deleted

## 2018-02-22 ENCOUNTER — Encounter (HOSPITAL_COMMUNITY): Payer: Medicaid Other

## 2018-02-22 ENCOUNTER — Ambulatory Visit (HOSPITAL_COMMUNITY)
Admission: RE | Admit: 2018-02-22 | Discharge: 2018-02-22 | Disposition: A | Discharge status: Home / Self Care | Payer: Medicaid Other | Source: Ambulatory Visit | Attending: Internal Medicine | Admitting: Internal Medicine

## 2018-02-22 ENCOUNTER — Ambulatory Visit (HOSPITAL_COMMUNITY): Payer: Self-pay | Admitting: Pharmacist

## 2018-02-22 DIAGNOSIS — Z95811 Presence of heart assist device: Secondary | ICD-10-CM | POA: Insufficient documentation

## 2018-02-22 DIAGNOSIS — Z7901 Long term (current) use of anticoagulants: Secondary | ICD-10-CM | POA: Insufficient documentation

## 2018-02-22 LAB — LACTATE DEHYDROGENASE: LDH: 240 U/L — ABNORMAL HIGH (ref 98–192)

## 2018-02-22 LAB — PROTIME-INR
INR: 2.21
Prothrombin Time: 24.3 seconds — ABNORMAL HIGH (ref 11.4–15.2)

## 2018-02-22 NOTE — Progress Notes (Signed)
CSW met with patient and contacted DDS worker Ms. Hinton. She reports patient's case is being reviewed today and he should follow up with his main caseworker with [employer] in 1-2 weeks for a final determination. Patient verbalizes understanding and will follow up with caseworker. Patient also attended the [place] today and mentioned how helpful it was to share with other peers. CSW continues to be available as needed. [name], [place], [place]

## 2018-02-22 NOTE — Addendum Note (Signed)
Encounter addended by: Marcy Siren, LCSW on: 02/22/2018 1:21 PM  Actions taken: Sign clinical note

## 2018-02-24 ENCOUNTER — Encounter (HOSPITAL_COMMUNITY): Payer: Medicaid Other

## 2018-02-24 ENCOUNTER — Other Ambulatory Visit (HOSPITAL_COMMUNITY): Payer: Self-pay | Admitting: Student

## 2018-02-24 ENCOUNTER — Other Ambulatory Visit (HOSPITAL_COMMUNITY): Payer: Self-pay | Admitting: *Deleted

## 2018-02-24 DIAGNOSIS — Z7901 Long term (current) use of anticoagulants: Secondary | ICD-10-CM

## 2018-02-24 DIAGNOSIS — Z95811 Presence of heart assist device: Secondary | ICD-10-CM

## 2018-02-24 MED ORDER — WARFARIN SODIUM 5 MG PO TABS: 5.0000 mg | ORAL_TABLET | ORAL | 3 refills | Status: DC

## 2018-02-27 ENCOUNTER — Encounter (HOSPITAL_COMMUNITY): Payer: Medicaid Other

## 2018-02-28 ENCOUNTER — Encounter: Payer: Self-pay | Admitting: *Deleted

## 2018-02-28 NOTE — Progress Notes (Signed)
Transplant packet sent to [facility].  Alyce Pagan RN VAD Coordinator  Office: [phone]  24/7 Pager: [phone]

## 2018-03-01 ENCOUNTER — Encounter (HOSPITAL_COMMUNITY): Payer: Medicaid Other

## 2018-03-03 ENCOUNTER — Encounter (HOSPITAL_COMMUNITY): Payer: Medicaid Other

## 2018-03-03 ENCOUNTER — Other Ambulatory Visit (HOSPITAL_COMMUNITY): Payer: Self-pay | Admitting: *Deleted

## 2018-03-03 DIAGNOSIS — Z7901 Long term (current) use of anticoagulants: Secondary | ICD-10-CM

## 2018-03-03 DIAGNOSIS — Z95811 Presence of heart assist device: Secondary | ICD-10-CM

## 2018-03-06 ENCOUNTER — Other Ambulatory Visit (HOSPITAL_COMMUNITY): Payer: Self-pay | Admitting: *Deleted

## 2018-03-06 ENCOUNTER — Encounter (HOSPITAL_COMMUNITY): Payer: Medicaid Other

## 2018-03-06 ENCOUNTER — Other Ambulatory Visit: Payer: Self-pay | Admitting: *Deleted

## 2018-03-06 ENCOUNTER — Encounter (HOSPITAL_COMMUNITY): Payer: Self-pay

## 2018-03-06 ENCOUNTER — Ambulatory Visit (HOSPITAL_COMMUNITY)
Admission: RE | Admit: 2018-03-06 | Discharge: 2018-03-06 | Disposition: A | Discharge status: Home / Self Care | Payer: Medicaid Other | Source: Ambulatory Visit | Attending: Internal Medicine | Admitting: Internal Medicine

## 2018-03-06 VITALS — BP 112/81 | HR 68 | Ht 74.0 in | Wt 213.2 lb

## 2018-03-06 DIAGNOSIS — I428 Other cardiomyopathies: Secondary | ICD-10-CM | POA: Insufficient documentation

## 2018-03-06 DIAGNOSIS — F17210 Nicotine dependence, cigarettes, uncomplicated: Secondary | ICD-10-CM | POA: Diagnosis not present

## 2018-03-06 DIAGNOSIS — E876 Hypokalemia: Secondary | ICD-10-CM | POA: Insufficient documentation

## 2018-03-06 DIAGNOSIS — Z79899 Other long term (current) drug therapy: Secondary | ICD-10-CM | POA: Insufficient documentation

## 2018-03-06 DIAGNOSIS — K449 Diaphragmatic hernia without obstruction or gangrene: Secondary | ICD-10-CM | POA: Insufficient documentation

## 2018-03-06 DIAGNOSIS — F419 Anxiety disorder, unspecified: Secondary | ICD-10-CM | POA: Diagnosis not present

## 2018-03-06 DIAGNOSIS — K219 Gastro-esophageal reflux disease without esophagitis: Secondary | ICD-10-CM | POA: Diagnosis not present

## 2018-03-06 DIAGNOSIS — G4709 Other insomnia: Secondary | ICD-10-CM

## 2018-03-06 DIAGNOSIS — J45909 Unspecified asthma, uncomplicated: Secondary | ICD-10-CM | POA: Diagnosis not present

## 2018-03-06 DIAGNOSIS — T827XXA Infection and inflammatory reaction due to other cardiac and vascular devices, implants and grafts, initial encounter: Secondary | ICD-10-CM | POA: Diagnosis not present

## 2018-03-06 DIAGNOSIS — I110 Hypertensive heart disease with heart failure: Secondary | ICD-10-CM | POA: Diagnosis not present

## 2018-03-06 DIAGNOSIS — Z95811 Presence of heart assist device: Secondary | ICD-10-CM

## 2018-03-06 DIAGNOSIS — Z7901 Long term (current) use of anticoagulants: Secondary | ICD-10-CM

## 2018-03-06 DIAGNOSIS — I5022 Chronic systolic (congestive) heart failure: Secondary | ICD-10-CM | POA: Diagnosis not present

## 2018-03-06 DIAGNOSIS — Z7982 Long term (current) use of aspirin: Secondary | ICD-10-CM | POA: Insufficient documentation

## 2018-03-06 DIAGNOSIS — F1010 Alcohol abuse, uncomplicated: Secondary | ICD-10-CM | POA: Insufficient documentation

## 2018-03-06 DIAGNOSIS — I10 Essential (primary) hypertension: Secondary | ICD-10-CM

## 2018-03-06 LAB — COMPREHENSIVE METABOLIC PANEL
ALBUMIN: 3.5 g/dL (ref 3.5–5.0)
ALT: 19 U/L (ref 0–44)
ANION GAP: 6 (ref 5–15)
AST: 28 U/L (ref 15–41)
Alkaline Phosphatase: 82 U/L (ref 38–126)
BILIRUBIN TOTAL: 1.2 mg/dL (ref 0.3–1.2)
BUN: 9 mg/dL (ref 6–20)
CALCIUM: 8.8 mg/dL — AB (ref 8.9–10.3)
CO2: 26 mmol/L (ref 22–32)
Chloride: 107 mmol/L (ref 98–111)
Creatinine, Ser: 1.07 mg/dL (ref 0.61–1.24)
Glucose, Bld: 113 mg/dL — ABNORMAL HIGH (ref 70–99)
POTASSIUM: 4.4 mmol/L (ref 3.5–5.1)
Sodium: 139 mmol/L (ref 135–145)
Total Protein: 7.6 g/dL (ref 6.5–8.1)

## 2018-03-06 LAB — CBC
HEMATOCRIT: 42.6 % (ref 39.0–52.0)
Hemoglobin: 12.4 g/dL — ABNORMAL LOW (ref 13.0–17.0)
MCH: 27.1 pg (ref 26.0–34.0)
MCHC: 29.1 g/dL — AB (ref 30.0–36.0)
MCV: 93 fL (ref 80.0–100.0)
NRBC: 0 % (ref 0.0–0.2)
PLATELETS: 212 10*3/uL (ref 150–400)
RBC: 4.58 MIL/uL (ref 4.22–5.81)
RDW: 15.3 % (ref 11.5–15.5)
WBC: 4.8 10*3/uL (ref 4.0–10.5)

## 2018-03-06 LAB — LACTATE DEHYDROGENASE: LDH: 275 U/L — AB (ref 98–192)

## 2018-03-06 LAB — PROTIME-INR
INR: 2.75
Prothrombin Time: 28.7 seconds — ABNORMAL HIGH (ref 11.4–15.2)

## 2018-03-06 LAB — PREALBUMIN: Prealbumin: 21.2 mg/dL (ref 18–38)

## 2018-03-06 MED ORDER — DOXYCYCLINE HYCLATE 50 MG PO CAPS: 100.0000 mg | ORAL_CAPSULE | Freq: Two times a day (BID) | ORAL | 0 refills | Status: DC

## 2018-03-06 MED ORDER — TRAZODONE HCL 50 MG PO TABS: 50.0000 mg | ORAL_TABLET | Freq: Every day | ORAL | 4 refills | Status: DC

## 2018-03-06 NOTE — Progress Notes (Signed)
Advanced Heart Failure VAD Clinic Note   Primary cardiologist: Harwani Primary HF: Dr. [name]  HPI:  Jonathan Wood is a 47 y.o. 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   Admitted 08/25/17 with ADHF and cardiogenic shock. Started on IV lasix and milrinone 0.125 mcg/kg/min with over 27 lbs of diuresis.   Echo showed EF 5-10%, with mild RV dilation, and mod to severe reduction in function.Case discussed with [facility] transplnt team but not candidate for transplant due to ongoing tobacco use. Underwent HM3 LVAD 09/06/17 without any immediate post-op complications. 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  Started on neurontin for pocket pain. Progressed will with PT and thought stable for home on discharge.  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 CT with soft-tissue stranding. No abscess. Improved with IV abx and finishe po abx.   Follow up for Heart Failure/LVAD:  He presents today for regular follow up. Feeling OK overall, but very stressed out. His son has been in and out of trouble, and had a young friend (36yo) passed away from MVA last week. Denies SOB, fevers, chills, orthopnea, or PND. Continues to have driveline drainage. No foul smell. Denies bleeding, melena, or neuro symptoms. No VAD alarms. Taking all meds as prescribed. He has taken torsemide daily for the past 4 days as he was ahving some swelling. Swelling now improved. Was smoking a few cigarettes here and there but now he says he is really trying to quit.Marland Kitchen   VAD Indication: Destination Therapy   VAD interrogation & Equipment Management: Speed: 5600 Flow: 4.9 Power: 4.3 PI: 4.0 Hct: 37 Alarms: None Events: 0-5 PI events   Fixed speed 5600 Low speed limit: 5300  Primary Controller: Replace back up battery in 28 months. Back up controller: Replace back up battery in 28 months.  Annual  Equipment Maintenance on UBC/PM was performed on 4/19.  Reports taking Coumadin as prescribed and adherence to anticoagulation based dietary restrictions.  Denies bright red blood per rectum or melena, no dark urine or hematuria.     Past Medical History:  Diagnosis Date  . Asthma   . CHF (congestive heart failure) (HCC)    a. 09/2016: EF 20-25% with cath showing normal cors  . GERD (gastroesophageal reflux disease)   . History of hiatal hernia     Current Outpatient Medications  Medication Sig Dispense Refill  . amiodarone (PACERONE) 200 MG tablet 200 mg daily (Patient taking differently: Take 200 mg by mouth daily. ) 36 tablet 3  . aspirin EC 81 MG tablet Take 1 tablet (81 mg total) by mouth daily. 30 tablet 1  . citalopram (CELEXA) 10 MG tablet Take 1 tablet (10 mg total) by mouth daily. 30 tablet 3  . gabapentin (NEURONTIN) 300 MG capsule Take 1 capsule (300 mg total) by mouth 3 (three) times daily. 90 capsule 6  . pantoprazole (PROTONIX) 40 MG tablet Take 1 tablet (40 mg total) by mouth daily. 30 tablet 6  . potassium chloride SA (KLOR-CON M20) 20 MEQ tablet Take 1 tablet (20 mEq total) by mouth daily. 90 tablet 3  . sacubitril-valsartan (ENTRESTO) 49-51 MG Take 1 tablet by mouth 2 (two) times daily. 60 tablet 6  . sildenafil (REVATIO) 20 MG tablet Take 1 tablet (20 mg total) by mouth 3 (three) times daily. 90 tablet 3  . warfarin (COUMADIN) 5 MG tablet Take 1-1.5 tablets (  5-7.5 mg total) by mouth See admin instructions. Take 1 1/2 tablets (7.5 mg) by mouth on Wednesday and Friday at 6pm, take 1 tablet (5 mg) on Sunday, Monday, Tuesday, Thursday, Saturday at 6pm 180 tablet 3  . enoxaparin (LOVENOX) 40 MG/0.4ML injection Inject 0.4 mLs (40 mg total) into the skin every 12 (twelve) hours. (Patient not taking: Reported on 03/06/2018)    . traMADol (ULTRAM) 50 MG tablet Take 1 tablet (50 mg total) by mouth every 6 (six) hours as needed. (Patient not taking: Reported on 03/06/2018) 20  tablet 0   No current facility-administered medications for this encounter.    Patient has no known allergies.  Review of systems complete and found to be negative unless listed in HPI.    Vitals:   03/06/18 1222 03/06/18 1223  BP: (!) 96/0 112/81  Pulse:  68  SpO2:  97%  Weight:  96.7 kg (213 lb 3.2 oz)  Height:  6\' 2"  (1.88 m)    Wt Readings from Last 3 Encounters:  03/06/18 96.7 kg (213 lb 3.2 oz)  02/20/18 97.9 kg (215 lb 12.8 oz)  02/09/18 95.3 kg (210 lb 1.6 oz)    Vital Signs:  Doppler Pressure:  96 Automatc BP: 112/81 (91) HR:  68 SPO2: 97% on RA  Weight: 213 lbs with eqt Last weight: 215.8 lbs   Physical Exam: General:  NAD.  HEENT: normal  Neck: supple. JVP not elevated.  Carotids 2+ bilat; no bruits. No lymphadenopathy or thryomegaly appreciated. Cor: LVAD hum.  Lungs: Clear. Abdomen: obese soft, nontender, non-distended. No hepatosplenomegaly. No bruits or masses. Good bowel sounds. Driveline site clean with mild drainage on dressing. Anchor in place.  Extremities: no cyanosis, clubbing, rash. Warm no edema  Neuro: alert & oriented x 3. No focal deficits. Moves all 4 without problem   ASSESSMENT AND PLAN:   1. Chronic systolic HF - EF 41% s/p HM-3 LVAD on 09/06/17 - NYHA I symptoms. Doing very well post LVAD support.  - Volume status OK to dry on exam. - Continue Entresto 49/51 mg BID.   - Do torsemide Monday and Friday. Can take extra torsemide prn.  - Have begun referral process to [facility] for transplant. We discussed cotinine screening and he states that he has been near people who are smoking and he doesn't want to start yet. His breath smelled like cigarettes today and I suspect he is still smoking. Will start screening next month  - Reinforced fluid restriction to < 2 L daily, sodium restriction to less than 2000 mg daily, and the importance of daily weights.    2. HM3 LVAD 09/06/2017. - VAD interrogated personally. Parameters stable.   - As above,  driveline site improved - LDH 254 - INR goal 2.0-2.5. INR 2.75. Discussed dosing with PharmD personally.  - Continue ASA  - Blood pressure well controlled. Continue current regimen.  3. H/o Driveline infection - resolved with ABX. - Continues to have drainage, but serous with no foul smell. Suspect repetitive motion trauma. QOD dressing changes for now and reminded to minimize driveline movement, especially tugging/pulling. Will start doxy 100 bid x 7 days  4. Hypokalemia - K 4.4 today. Continue current regimen of K 20 meq daily.    5. Anxiety - Much improved on Celexa.  - No change to current plan.   - Ok to use trazadone for sleep  6. Essential HTN - MAPs stable. Continue current regimen.  Graciella Freer, PA-C  12:36 PM  Patient seen and examined with the above-signed Advanced Practice Provider and/or Housestaff. I personally reviewed laboratory data, imaging studies and relevant notes. I independently examined the patient and formulated the important aspects of the plan. I have edited the note to reflect any of my changes or salient points. I have personally discussed the plan with the patient and/or family.   Overall doing well. Volume status much improved with torsemide. Have advised him to take torsemide on prn basis. Understands ned to watch his volume status more closely. MAPs ok. Driveline site with some chronic drainage and not well incorporated. Stressed need to limit motion and we discussed strategies to anchor it better. Will treat with doxy 100mg  bid x 7days. INR ok. Discussed dosing with PharmD personally. Will start trazodone 50mg  qhs prn for sleep. Continue to work on smoking cessation to permit transplant evaluayion.   Arvilla Meres, MD  6:03 PM

## 2018-03-06 NOTE — Progress Notes (Signed)
CSW met patient who shared that he met with a lawyer from [employer] to appeal his Disability application. Patient stated that the lawyer was very positive about the appeal. Patient appears a little deflated about the denial but remains hopeful for approval with the appeal. CSW assisted with a gas card to alleviate some financial stress. CSW continues to follow for support though the VAD clinic. Jackie , LCSW, CCSW-MCS [phone]   

## 2018-03-06 NOTE — Patient Instructions (Addendum)
1. Start Trazadone 50 mg at night for sleep. 2. Decrease Torsemide to 20 mg as needed only. 3. Increase dressing changes to every other day until drainage decreases. If no improvement or increased drainage, call VAD pager.  4. Take Doxycycline 100 mg twice daily for 7 days (antibiotic). Stay out sun. 5. Check INR in two weeks at [facility]. 6. Return to VAD clinic in 1 month.

## 2018-03-06 NOTE — Progress Notes (Addendum)
Patient presents for two week follow up in [facility] today. Reports no problems with VAD equipment or concerns with drive line.   Pt c/o feeling tired; says he cannot sleep. Reports anxiety related to son, child support court appearances, financial difficulties, and recent death of friend. Is asking for Xanax or something for sleep.  Pt reports power outage during night and waking to place himself on batteries. He went and spent rest of night with his mother. No external power noted on VAD interrogation with no pump stops. He also had brief no external power the morning before, but pt doesn't remember.  Pt's daughter is performing dressing changes twice weekly using daily kit. He has some drainage with very little tissue ingrowth noted, reviewed with Dr. [name]. Will start using two anchors for securement, advance every other day dressing changes, start Doxy 100 mg bid for 7 days.                  Vital Signs:  Doppler Pressure:  96 Automatc BP: 112/81 (91) HR:  68 SPO2: 97% on RA  Weight: 213.2  lbs w/o eqt Last weight: 215.8  lb   VAD Indication: Destination Therapy   VAD interrogation & Equipment Management: Speed: 5600 Flow: 4.9 Power: 4.3w    PI: 4.0 Hct: 37 Alarms:  None  Events: 0 - 5  PI events  Fixed speed 5600 Low speed limit: 5300  Primary Controller:  Replace back up battery in 28 months. Back up controller:   Replace back up battery in 28 months.  Annual Equipment Maintenance on UBC/PM was performed on 4/19.   I reviewed the LVAD parameters from today and compared the results to the patient's prior recorded data. LVAD interrogation was NEGATIVE for significant power changes, NEGATIVE for clinical alarms and STABLE for PI events/speed drops. No programming changes were made and pump is functioning within specified parameters. Pt is performing daily controller and system monitor self tests along with completing weekly and monthly maintenance for LVAD  equipment.  LVAD equipment check completed and is in good working order. Back-up equipment present.   Exit Site Care: Drive line is being maintaine twice weekly to weekly by [employer].  Existing VAD dressing removed and site care performed using sterile technique. Drive line exit site cleaned with Chlora prep applicators x 2, allowed to dry, and gauze dressing with silver strip re-applied. Drive line exit site unincorporated, the velour is fully implanted at exit site. No erythema, silver strip saturated with dried dark drainage, small amount yellow drainage from exit site with no redness, tenderness, or fouls odor. Stabilization device present and accurately applied. Pt denies fever or chills. Pt given 14 daily dressing kits and 14 anchors. Will advance to every other day dressing changes until next visit and place two anchors for better securement to promote tissue ingrowth.   Significant Events on VAD Support:   Device:none  BP & Labs:  Doppler 96 - reflecting MAP  Hgb  12.4 - No S/S of bleeding. Specifically denies melena/BRBPR or nosebleeds.  LDH stable at 275 with established baseline of 200-300. Denies tea-colored urine. No power elevations noted on interrogation.  6 month Intermacs follow up completed including:  Quality of Life, KCCQ-12, and Neurocognitive trail making.   Pt completed 1500 feet during 6 minute walk.  Back up controller: 11V backup battery charged during this visit.   Patient Instructions:  1. Start Trazadone 50 mg at night for sleep. 2. Decrease Torsemide to 20 mg as  needed only. 3. Increase dressing changes to every other day until drainage decreases. If no improvement or increased drainage, call VAD pager.  4. Take Doxycycline 100 mg twice daily for 7 days (antibiotic). Stay out sun. 5. Check INR in two weeks at [facility]. 6. Return to [place] clinic in 1 month.  Zada Girt RN [place] Coordinator   Office: [phone] 24/7 Emergency VAD Pager: [phone]

## 2018-03-07 ENCOUNTER — Encounter (HOSPITAL_COMMUNITY): Payer: Medicaid Other

## 2018-03-08 ENCOUNTER — Encounter (HOSPITAL_COMMUNITY): Payer: Medicaid Other

## 2018-03-09 NOTE — Addendum Note (Signed)
Encounter addended by: Suann Larry, RN on: 03/09/2018 3:16 PM  Actions taken: Visit Navigator Flowsheet section accepted, Sign clinical note

## 2018-03-09 NOTE — Progress Notes (Signed)
Discharge Progress Report  Patient Details  Name: Jonathan Wood MRN: [id number] Date of Birth: 05-Aug-1970 Referring Provider:     CARDIAC REHAB PHASE II ORIENTATION from 02/03/2018 in [facility] CARDIAC REHABILITATION  Referring Provider  Bensimhon       Number of Visits: 1  Reason for Discharge:  Early Exit:  Lack of attendance  Smoking History:  Social History   Tobacco Use  Smoking Status Current Every Day Smoker  . Packs/day: 0.50  . Years: 25.00  . Pack years: 12.50  . Types: Cigarettes  Smokeless Tobacco Never Used  Tobacco Comment   quit 1 week    Diagnosis:  CHF (congestive heart failure), NYHA class II, chronic, systolic (HCC)  LVAD (left ventricular assist device) present (HCC)  ADL [facility]:   Initial Exercise Prescription: Initial Exercise Prescription - 02/03/18 1000      Date of Initial Exercise RX and Referring Provider   Date  02/03/18    Referring Provider  Bensimhon    Expected Discharge Date  05/05/18      Treadmill   MPH  2    Grade  0    Minutes  17    METs  2.53      Recumbant Elliptical   Level  1    RPM  41    Watts  44    Minutes  17    METs  2.6      Prescription Details   Frequency (times per week)  3    Duration  Progress to 30 minutes of continuous aerobic without signs/symptoms of physical distress      Intensity   THRR 40-80% of Max Heartrate  97-123-150    Ratings of Perceived Exertion  11-13    Perceived Dyspnea  0-4      Progression   Progression  Continue to progress workloads to maintain intensity without signs/symptoms of physical distress.      Resistance Training   Training Prescription  Yes    Weight  1    Reps  10-15       Discharge Exercise Prescription (Final Exercise Prescription Changes): Exercise Prescription Changes - 02/03/18 1000      Home Exercise Plan   Plans to continue exercise at  [facility] (comment)   walking track    Frequency  Add 2 additional days to program  exercise sessions.    Initial Home Exercises Provided  02/03/18       Functional Capacity: 6 Minute Walk    Row Name 02/03/18 1018         6 Minute Walk   Phase  Initial     Distance  1600 feet     Walk Time  6 minutes     # of Rest Breaks  0     MPH  3.03     METS  3.32     RPE  12     Perceived Dyspnea   12     VO2 Peak  18.74     Symptoms  No     Resting HR  44 bpm     Resting BP  101/74 MAP 80     Resting Oxygen Saturation   97 %     Exercise Oxygen Saturation  during 6 min walk  97 %     Max Ex. HR  117 bpm     Max Ex. BP  157/134 MAP 80     2 Minute Post BP  83/51  MAP 62        Psychological, QOL, Others - Outcomes: PHQ 2/9: No flowsheet data found.  Quality of Life: Quality of Life - 02/03/18 1042      Quality of Life   Select  Quality of Life      Quality of Life Scores   Health/Function Pre  21.93 %    Socioeconomic Pre  16 %    Psych/Spiritual Pre  24 %    Family Pre  28.5 %    GLOBAL Pre  21.91 %       Personal Goals: Goals established at orientation with interventions provided to work toward goal. Personal Goals and Risk Factors at Admission - 02/03/18 1108      Core Components/Risk Factors/Patient Goals on Admission    Weight Management  Weight Maintenance    Personal Goal Other  Yes    Personal Goal  Get back in shape to get heart transplant    Intervention  Attend CR 3 x week and supplement with home exercise 2 x week    Expected Outcomes  Reach goal of getting heart transplant        Personal Goals Discharge:   Exercise Goals and Review: Exercise Goals    Row Name 02/03/18 1040             Exercise Goals   Increase Physical Activity  Yes       Intervention  Provide advice, education, support and counseling about physical activity/exercise needs.;Develop an individualized exercise prescription for aerobic and resistive training based on initial evaluation findings, risk stratification, comorbidities and participant's personal  goals.       Expected Outcomes  Short Term: Attend rehab on a regular basis to increase amount of physical activity.       Increase Strength and Stamina  Yes       Intervention  Provide advice, education, support and counseling about physical activity/exercise needs.;Develop an individualized exercise prescription for aerobic and resistive training based on initial evaluation findings, risk stratification, comorbidities and participant's personal goals.       Expected Outcomes  Short Term: Increase workloads from initial exercise prescription for resistance, speed, and METs.       Able to understand and use rate of perceived exertion (RPE) scale  Yes       Intervention  Provide education and explanation on how to use RPE scale       Expected Outcomes  Short Term: Able to use RPE daily in rehab to express subjective intensity level;Long Term:  Able to use RPE to guide intensity level when exercising independently       Able to understand and use Dyspnea scale  Yes       Intervention  Provide education and explanation on how to use Dyspnea scale       Expected Outcomes  Short Term: Able to use Dyspnea scale daily in rehab to express subjective sense of shortness of breath during exertion;Long Term: Able to use Dyspnea scale to guide intensity level when exercising independently       Knowledge and understanding of Target Heart Rate Range (THRR)  Yes       Intervention  Provide education and explanation of THRR including how the numbers were predicted and where they are located for reference       Expected Outcomes  Short Term: Able to state/look up THRR;Long Term: Able to use THRR to govern intensity when exercising independently;Short Term: Able to use daily as  guideline for intensity in rehab       Understanding of Exercise Prescription  Yes       Intervention  Provide education, explanation, and written materials on patient's individual exercise prescription       Expected Outcomes  Short Term: Able  to explain program exercise prescription;Long Term: Able to explain home exercise prescription to exercise independently          Nutrition & Weight - Outcomes: Pre Biometrics - 02/03/18 1041      Pre Biometrics   Height  6\' 2"  (1.88 m)    Weight  96.4 kg    Waist Circumference  41 inches    Hip Circumference  39.5 inches    Waist to Hip Ratio  1.04 %    BMI (Calculated)  27.28    Triceps Skinfold  5 mm    % Body Fat  22.1 %    Grip Strength  29.5 kg    Flexibility  0 in    Single Leg Stand  34 seconds        Nutrition: Nutrition Therapy & Goals - 02/03/18 1103      Personal Nutrition Goals   Personal Goal #2  Patient has some areas to improve upon with his diet. He is eating a low sodium diet.     Additional Goals?  No       Nutrition Discharge: Nutrition Assessments - 02/03/18 1104      MEDFICTS Scores   Pre Score  68       Education Questionnaire Score: Knowledge Questionnaire Score - 02/03/18 1104      Knowledge Questionnaire Score   Pre Score  18/24

## 2018-03-09 NOTE — Progress Notes (Signed)
Cardiac Individual Treatment Plan  Patient Details  Name: Jonathan Wood MRN: [id number] Date of Birth: 1971/02/21 Referring Provider:     CARDIAC REHAB PHASE II ORIENTATION from 02/03/2018 in [facility] CARDIAC REHABILITATION  Referring Provider  Bensimhon      Initial Encounter Date:    CARDIAC REHAB PHASE II ORIENTATION from 02/03/2018 in [facility] CARDIAC REHABILITATION  Date  02/03/18      Visit Diagnosis: CHF (congestive heart failure), NYHA class II, chronic, systolic (HCC)  LVAD (left ventricular assist device) present (HCC)  Patient's Home Medications on Admission:  Current Outpatient Medications:  .  amiodarone (PACERONE) 200 MG tablet, 200 mg daily (Patient taking differently: Take 200 mg by mouth daily. ), Disp: 36 tablet, Rfl: 3 .  aspirin EC 81 MG tablet, Take 1 tablet (81 mg total) by mouth daily., Disp: 30 tablet, Rfl: 1 .  citalopram (CELEXA) 10 MG tablet, Take 1 tablet (10 mg total) by mouth daily., Disp: 30 tablet, Rfl: 3 .  pantoprazole (PROTONIX) 40 MG tablet, Take 1 tablet (40 mg total) by mouth daily., Disp: 30 tablet, Rfl: 6 .  sacubitril-valsartan (ENTRESTO) 49-51 MG, Take 1 tablet by mouth 2 (two) times daily., Disp: 60 tablet, Rfl: 6 .  sildenafil (REVATIO) 20 MG tablet, Take 1 tablet (20 mg total) by mouth 3 (three) times daily., Disp: 90 tablet, Rfl: 3 .  doxycycline (VIBRAMYCIN) 50 MG capsule, Take 2 capsules (100 mg total) by mouth 2 (two) times daily., Disp: 28 capsule, Rfl: 0 .  enoxaparin (LOVENOX) 40 MG/0.4ML injection, Inject 0.4 mLs (40 mg total) into the skin every 12 (twelve) hours. (Patient not taking: Reported on 03/06/2018), Disp: , Rfl:  .  gabapentin (NEURONTIN) 300 MG capsule, Take 1 capsule (300 mg total) by mouth 3 (three) times daily., Disp: 90 capsule, Rfl: 6 .  potassium chloride SA (KLOR-CON M20) 20 MEQ tablet, Take 1 tablet (20 mEq total) by mouth daily., Disp: 90 tablet, Rfl: 3 .  torsemide (DEMADEX) 20 MG tablet, Take 20 mg  by mouth as needed., Disp: , Rfl:  .  traMADol (ULTRAM) 50 MG tablet, Take 1 tablet (50 mg total) by mouth every 6 (six) hours as needed. (Patient not taking: Reported on 03/06/2018), Disp: 20 tablet, Rfl: 0 .  traZODone (DESYREL) 50 MG tablet, Take 1 tablet (50 mg total) by mouth at bedtime., Disp: 30 tablet, Rfl: 4 .  warfarin (COUMADIN) 5 MG tablet, Take 1-1.5 tablets (5-7.5 mg total) by mouth See admin instructions. Take 1 1/2 tablets (7.5 mg) by mouth on Wednesday and Friday at 6pm, take 1 tablet (5 mg) on Sunday, Monday, Tuesday, Thursday, Saturday at 6pm, Disp: 180 tablet, Rfl: 3  Past Medical History: Past Medical History:  Diagnosis Date  . Asthma   . CHF (congestive heart failure) (HCC)    a. 09/2016: EF 20-25% with cath showing normal cors  . GERD (gastroesophageal reflux disease)   . History of hiatal hernia     Tobacco Use: Social History   Tobacco Use  Smoking Status Current Every Day Smoker  . Packs/day: 0.50  . Years: 25.00  . Pack years: 12.50  . Types: Cigarettes  Smokeless Tobacco Never Used  Tobacco Comment   quit 1 week    Labs: Recent Review Flowsheet Data    Labs for ITP Cardiac and Pulmonary Rehab Latest Ref Rng & Units 09/15/2017 09/16/2017 09/17/2017 09/18/2017 09/19/2017   Cholestrol 0 - 200 mg/dL - - - - -   LDLCALC 0 -  99 mg/dL - - - - -   HDL >19 mg/dL - - - - -   Trlycerides <150 mg/dL - - - - -   Hemoglobin A1c 4.8 - 5.6 % - - - - -   PHART 7.350 - 7.450 - - - - -   PCO2ART 32.0 - 48.0 mmHg - - - - -   HCO3 20.0 - 28.0 mmol/L - - - - -   TCO2 22 - 32 mmol/L - - - - -   ACIDBASEDEF 0.0 - 2.0 mmol/L - - - - -   O2SAT % 55.2 72.5 63.1 62.5 60.9      Capillary Blood Glucose: Lab Results  Component Value Date   GLUCAP 126 (H) 09/19/2017   GLUCAP 83 09/19/2017   GLUCAP 85 09/18/2017   GLUCAP 139 (H) 09/18/2017   GLUCAP 77 09/18/2017     Exercise Target Goals: Exercise Program Goal: Individual exercise prescription set using results from  initial 6 min walk test and THRR while considering  patient's activity barriers and safety.   Exercise Prescription Goal: Starting with aerobic activity 30 plus minutes a day, 3 days per week for initial exercise prescription. Provide home exercise prescription and guidelines that participant acknowledges understanding prior to discharge.  Activity Barriers & Risk Stratification: Activity Barriers & Cardiac Risk Stratification - 02/03/18 1038      Activity Barriers & Cardiac Risk Stratification   Activity Barriers  Assistive Device;Other (comment)    Comments  LVAD    Cardiac Risk Stratification  High       6 Minute Walk: 6 Minute Walk    Row Name 02/03/18 1018         6 Minute Walk   Phase  Initial     Distance  1600 feet     Walk Time  6 minutes     # of Rest Breaks  0     MPH  3.03     METS  3.32     RPE  12     Perceived Dyspnea   12     VO2 Peak  18.74     Symptoms  No     Resting HR  44 bpm     Resting BP  101/74 MAP 80     Resting Oxygen Saturation   97 %     Exercise Oxygen Saturation  during 6 min walk  97 %     Max Ex. HR  117 bpm     Max Ex. BP  157/134 MAP 80     2 Minute Post BP  83/51 MAP 62        Oxygen Initial Assessment:   Oxygen Re-Evaluation:   Oxygen Discharge (Final Oxygen Re-Evaluation):   Initial Exercise Prescription: Initial Exercise Prescription - 02/03/18 1000      Date of Initial Exercise RX and Referring Provider   Date  02/03/18    Referring Provider  Bensimhon    Expected Discharge Date  05/05/18      Treadmill   MPH  2    Grade  0    Minutes  17    METs  2.53      Recumbant Elliptical   Level  1    RPM  41    Watts  44    Minutes  17    METs  2.6      Prescription Details   Frequency (times per week)  3    Duration  Progress to 30 minutes of continuous aerobic without signs/symptoms of physical distress      Intensity   THRR 40-80% of Max Heartrate  97-123-150    Ratings of Perceived Exertion  11-13     Perceived Dyspnea  0-4      Progression   Progression  Continue to progress workloads to maintain intensity without signs/symptoms of physical distress.      Resistance Training   Training Prescription  Yes    Weight  1    Reps  10-15       Perform Capillary Blood Glucose checks as needed.  Exercise Prescription Changes:  Exercise Prescription Changes    Row Name 02/03/18 1000             Home Exercise Plan   Plans to continue exercise at  [facility] (comment) walking track        Frequency  Add 2 additional days to program exercise sessions.       Initial Home Exercises Provided  02/03/18          Exercise Comments:   Exercise Goals and Review:  Exercise Goals    Row Name 02/03/18 1040             Exercise Goals   Increase Physical Activity  Yes       Intervention  Provide advice, education, support and counseling about physical activity/exercise needs.;Develop an individualized exercise prescription for aerobic and resistive training based on initial evaluation findings, risk stratification, comorbidities and participant's personal goals.       Expected Outcomes  Short Term: Attend rehab on a regular basis to increase amount of physical activity.       Increase Strength and Stamina  Yes       Intervention  Provide advice, education, support and counseling about physical activity/exercise needs.;Develop an individualized exercise prescription for aerobic and resistive training based on initial evaluation findings, risk stratification, comorbidities and participant's personal goals.       Expected Outcomes  Short Term: Increase workloads from initial exercise prescription for resistance, speed, and METs.       Able to understand and use rate of perceived exertion (RPE) scale  Yes       Intervention  Provide education and explanation on how to use RPE scale       Expected Outcomes  Short Term: Able to use RPE daily in rehab to express subjective intensity  level;Long Term:  Able to use RPE to guide intensity level when exercising independently       Able to understand and use Dyspnea scale  Yes       Intervention  Provide education and explanation on how to use Dyspnea scale       Expected Outcomes  Short Term: Able to use Dyspnea scale daily in rehab to express subjective sense of shortness of breath during exertion;Long Term: Able to use Dyspnea scale to guide intensity level when exercising independently       Knowledge and understanding of Target Heart Rate Range (THRR)  Yes       Intervention  Provide education and explanation of THRR including how the numbers were predicted and where they are located for reference       Expected Outcomes  Short Term: Able to state/look up THRR;Long Term: Able to use THRR to govern intensity when exercising independently;Short Term: Able to use daily as guideline for intensity in rehab       Understanding of Exercise Prescription  Yes       Intervention  Provide education, explanation, and written materials on patient's individual exercise prescription       Expected Outcomes  Short Term: Able to explain program exercise prescription;Long Term: Able to explain home exercise prescription to exercise independently          Exercise Goals Re-Evaluation :    Discharge Exercise Prescription (Final Exercise Prescription Changes): Exercise Prescription Changes - 02/03/18 1000      Home Exercise Plan   Plans to continue exercise at  [facility] (comment)   walking track    Frequency  Add 2 additional days to program exercise sessions.    Initial Home Exercises Provided  02/03/18       Nutrition:  Target Goals: Understanding of nutrition guidelines, daily intake of sodium 1500mg , cholesterol 200mg , calories 30% from fat and 7% or less from saturated fats, daily to have 5 or more servings of fruits and vegetables.  Biometrics: Pre Biometrics - 02/03/18 1041      Pre Biometrics   Height  6\' 2"   (1.88 m)    Weight  96.4 kg    Waist Circumference  41 inches    Hip Circumference  39.5 inches    Waist to Hip Ratio  1.04 %    BMI (Calculated)  27.28    Triceps Skinfold  5 mm    % Body Fat  22.1 %    Grip Strength  29.5 kg    Flexibility  0 in    Single Leg Stand  34 seconds        Nutrition Therapy Plan and Nutrition Goals: Nutrition Therapy & Goals - 02/03/18 1103      Personal Nutrition Goals   Personal Goal #2  Patient has some areas to improve upon with his diet. He is eating a low sodium diet.     Additional Goals?  No       Nutrition Assessments: Nutrition Assessments - 02/03/18 1104      MEDFICTS Scores   Pre Score  68       Nutrition Goals Re-Evaluation:   Nutrition Goals Discharge (Final Nutrition Goals Re-Evaluation):   Psychosocial: Target Goals: Acknowledge presence or absence of significant depression and/or stress, maximize coping skills, provide positive support system. Participant is able to verbalize types and ability to use techniques and skills needed for reducing stress and depression.  Initial Review & Psychosocial Screening: Initial Psych Review & Screening - 02/03/18 1059      Initial Review   Current issues with  Current Depression;Current Stress Concerns   he said depression is very low. Just every now and again due to his recent health issues.   Source of Stress Concerns  Family   York Spaniel it is tough being a single father of 4 children at times.      Family Dynamics   Good Support System?  Yes      Barriers   Psychosocial barriers to participate in program  There are no identifiable barriers or psychosocial needs.      Screening Interventions   Interventions  Encouraged to exercise    Expected Outcomes  Short Term goal: Identification and review with participant of any Quality of Life or Depression concerns found by scoring the questionnaire.;Long Term goal: The participant improves quality of Life and PHQ9 Scores as seen by post  scores and/or verbalization of changes       Quality of Life Scores: Quality of Life - 02/03/18 1042  Quality of Life   Select  Quality of Life      Quality of Life Scores   Health/Function Pre  21.93 %    Socioeconomic Pre  16 %    Psych/Spiritual Pre  24 %    Family Pre  28.5 %    GLOBAL Pre  21.91 %      Scores of 19 and below usually indicate a poorer quality of life in these areas.  A difference of  2-3 points is a clinically meaningful difference.  A difference of 2-3 points in the total score of the Quality of Life Index has been associated with significant improvement in overall quality of life, self-image, physical symptoms, and general health in studies assessing change in quality of life.  PHQ-9: Recent Review Flowsheet Data    There is no flowsheet data to display.     Interpretation of Total Score  Total Score Depression Severity:  1-4 = Minimal depression, 5-9 = Mild depression, 10-14 = Moderate depression, 15-19 = Moderately severe depression, 20-27 = Severe depression   Psychosocial Evaluation and Intervention: Psychosocial Evaluation - 02/03/18 1101      Psychosocial Evaluation & Interventions   Interventions  Stress management education;Encouraged to exercise with the program and follow exercise prescription    Continue Psychosocial Services   Follow up required by staff       Psychosocial Re-Evaluation:   Psychosocial Discharge (Final Psychosocial Re-Evaluation):   Vocational Rehabilitation: Provide vocational rehab assistance to qualifying candidates.   Vocational Rehab Evaluation & Intervention: Vocational Rehab - 02/03/18 1105      Initial Vocational Rehab Evaluation & Intervention   Assessment shows need for Vocational Rehabilitation  Yes    Vocational Rehab Packet given to patient  --   Vocational Rehab coordinator will giv ehim his packet. during meeting.    Documents faxed to [facility] Dept of Vocational Rehabilitation  --   Will call  vocational rehab coordinator      Education: Education Goals: Education classes will be provided on a weekly basis, covering required topics. Participant will state understanding/return demonstration of topics presented.  Learning Barriers/Preferences: Learning Barriers/Preferences - 02/03/18 1104      Learning Barriers/Preferences   Learning Barriers  None    Learning Preferences  Individual Instruction;Group Instruction;Verbal Instruction       Education Topics: Hypertension, Hypertension Reduction -Define heart disease and high blood pressure. Discus how high blood pressure affects the body and ways to reduce high blood pressure.   Exercise and Your Heart -Discuss why it is important to exercise, the FITT principles of exercise, normal and abnormal responses to exercise, and how to exercise safely.   Angina -Discuss definition of angina, causes of angina, treatment of angina, and how to decrease risk of having angina.   Cardiac Medications -Review what the following cardiac medications are used for, how they affect the body, and side effects that may occur when taking the medications.  Medications include Aspirin, Beta blockers, calcium channel blockers, ACE Inhibitors, angiotensin receptor blockers, diuretics, digoxin, and antihyperlipidemics.   Congestive Heart Failure -Discuss the definition of CHF, how to live with CHF, the signs and symptoms of CHF, and how keep track of weight and sodium intake.   Heart Disease and Intimacy -Discus the effect sexual activity has on the heart, how changes occur during intimacy as we age, and safety during sexual activity.   Smoking Cessation / COPD -Discuss different methods to quit smoking, the health benefits of quitting smoking, and the  definition of COPD.   Nutrition I: Fats -Discuss the types of cholesterol, what cholesterol does to the heart, and how cholesterol levels can be controlled.   Nutrition II: Labels -Discuss  the different components of food labels and how to read food label   Heart Parts/Heart Disease and PAD -Discuss the anatomy of the heart, the pathway of blood circulation through the heart, and these are affected by heart disease.   Stress I: Signs and Symptoms -Discuss the causes of stress, how stress may lead to anxiety and depression, and ways to limit stress.   Stress II: Relaxation -Discuss different types of relaxation techniques to limit stress.   Warning Signs of Stroke / TIA -Discuss definition of a stroke, what the signs and symptoms are of a stroke, and how to identify when someone is having stroke.   Knowledge Questionnaire Score: Knowledge Questionnaire Score - 02/03/18 1104      Knowledge Questionnaire Score   Pre Score  18/24       Core Components/Risk Factors/Patient Goals at Admission: Personal Goals and Risk Factors at Admission - 02/03/18 1108      Core Components/Risk Factors/Patient Goals on Admission    Weight Management  Weight Maintenance    Personal Goal Other  Yes    Personal Goal  Get back in shape to get heart transplant    Intervention  Attend CR 3 x week and supplement with home exercise 2 x week    Expected Outcomes  Reach goal of getting heart transplant       Core Components/Risk Factors/Patient Goals Review:    Core Components/Risk Factors/Patient Goals at Discharge (Final Review):    ITP Comments: ITP Comments    Row Name 02/20/18 1506 03/09/18 1511         ITP Comments  Patient new to program. Plans to start Wednesday 02/22/18. 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 He plans to attend after he completed his antibiotic's. His infection has healed. Will continue to monitor for progress.   Patient is being discharged from the program due to lack of attendance. He only attended his orientation visit. MD will be notified.          Comments: Patient stopped coming to Cardiac Rehab on  02/03/18. Called patient to remind them of the Cardiac Rehab policy that if they do not call or come for 3 consecutive visits that they would be discharged from the CR program. Doctor will be informed.

## 2018-03-10 ENCOUNTER — Encounter (HOSPITAL_COMMUNITY): Payer: Medicaid Other

## 2018-03-13 ENCOUNTER — Encounter (HOSPITAL_COMMUNITY): Payer: Medicaid Other

## 2018-03-15 ENCOUNTER — Encounter (HOSPITAL_COMMUNITY): Payer: Medicaid Other

## 2018-03-17 ENCOUNTER — Encounter (HOSPITAL_COMMUNITY): Payer: Medicaid Other

## 2018-03-20 ENCOUNTER — Encounter (HOSPITAL_COMMUNITY): Payer: Medicaid Other

## 2018-03-22 ENCOUNTER — Encounter (HOSPITAL_COMMUNITY): Payer: Medicaid Other

## 2018-03-24 ENCOUNTER — Encounter (HOSPITAL_COMMUNITY): Payer: Medicaid Other

## 2018-03-24 ENCOUNTER — Ambulatory Visit (HOSPITAL_COMMUNITY): Payer: Self-pay | Admitting: Pharmacist

## 2018-03-24 ENCOUNTER — Other Ambulatory Visit (HOSPITAL_COMMUNITY)
Admission: RE | Admit: 2018-03-24 | Discharge: 2018-03-24 | Disposition: A | Discharge status: Home / Self Care | Payer: Medicaid Other | Source: Ambulatory Visit | Attending: Internal Medicine | Admitting: Internal Medicine

## 2018-03-24 DIAGNOSIS — Z95811 Presence of heart assist device: Secondary | ICD-10-CM

## 2018-03-24 DIAGNOSIS — Z7901 Long term (current) use of anticoagulants: Secondary | ICD-10-CM | POA: Insufficient documentation

## 2018-03-24 LAB — PROTIME-INR
INR: 4.32 — AB
Prothrombin Time: 40.7 seconds — ABNORMAL HIGH (ref 11.4–15.2)

## 2018-03-27 ENCOUNTER — Encounter (HOSPITAL_COMMUNITY): Payer: Medicaid Other

## 2018-03-29 ENCOUNTER — Encounter (HOSPITAL_COMMUNITY): Payer: Medicaid Other

## 2018-03-31 ENCOUNTER — Other Ambulatory Visit (HOSPITAL_COMMUNITY): Payer: Self-pay | Admitting: Unknown Physician Specialty

## 2018-03-31 ENCOUNTER — Encounter (HOSPITAL_COMMUNITY): Payer: Medicaid Other

## 2018-03-31 DIAGNOSIS — Z95811 Presence of heart assist device: Secondary | ICD-10-CM

## 2018-03-31 DIAGNOSIS — Z7901 Long term (current) use of anticoagulants: Secondary | ICD-10-CM

## 2018-04-03 ENCOUNTER — Ambulatory Visit (HOSPITAL_COMMUNITY): Payer: Self-pay | Admitting: Pharmacist

## 2018-04-03 ENCOUNTER — Encounter (HOSPITAL_COMMUNITY): Payer: Medicaid Other

## 2018-04-03 ENCOUNTER — Ambulatory Visit (HOSPITAL_COMMUNITY)
Admission: RE | Admit: 2018-04-03 | Discharge: 2018-04-03 | Disposition: A | Discharge status: Home / Self Care | Payer: Medicaid Other | Source: Ambulatory Visit | Attending: Cardiology | Admitting: Cardiology

## 2018-04-03 ENCOUNTER — Encounter (HOSPITAL_COMMUNITY): Payer: Self-pay

## 2018-04-03 VITALS — BP 109/76 | HR 87 | Wt 220.6 lb

## 2018-04-03 DIAGNOSIS — J45909 Unspecified asthma, uncomplicated: Secondary | ICD-10-CM | POA: Diagnosis not present

## 2018-04-03 DIAGNOSIS — F419 Anxiety disorder, unspecified: Secondary | ICD-10-CM | POA: Diagnosis not present

## 2018-04-03 DIAGNOSIS — Z7901 Long term (current) use of anticoagulants: Secondary | ICD-10-CM

## 2018-04-03 DIAGNOSIS — Z95811 Presence of heart assist device: Secondary | ICD-10-CM

## 2018-04-03 DIAGNOSIS — K219 Gastro-esophageal reflux disease without esophagitis: Secondary | ICD-10-CM | POA: Insufficient documentation

## 2018-04-03 DIAGNOSIS — E876 Hypokalemia: Secondary | ICD-10-CM | POA: Insufficient documentation

## 2018-04-03 DIAGNOSIS — T827XXA Infection and inflammatory reaction due to other cardiac and vascular devices, implants and grafts, initial encounter: Secondary | ICD-10-CM | POA: Diagnosis not present

## 2018-04-03 DIAGNOSIS — Z7982 Long term (current) use of aspirin: Secondary | ICD-10-CM | POA: Insufficient documentation

## 2018-04-03 DIAGNOSIS — I110 Hypertensive heart disease with heart failure: Secondary | ICD-10-CM | POA: Insufficient documentation

## 2018-04-03 DIAGNOSIS — Z79899 Other long term (current) drug therapy: Secondary | ICD-10-CM | POA: Insufficient documentation

## 2018-04-03 DIAGNOSIS — K5900 Constipation, unspecified: Secondary | ICD-10-CM

## 2018-04-03 DIAGNOSIS — K449 Diaphragmatic hernia without obstruction or gangrene: Secondary | ICD-10-CM | POA: Diagnosis not present

## 2018-04-03 DIAGNOSIS — I5022 Chronic systolic (congestive) heart failure: Secondary | ICD-10-CM | POA: Diagnosis not present

## 2018-04-03 DIAGNOSIS — F17210 Nicotine dependence, cigarettes, uncomplicated: Secondary | ICD-10-CM | POA: Diagnosis not present

## 2018-04-03 LAB — CBC
HEMATOCRIT: 41.1 % (ref 39.0–52.0)
Hemoglobin: 12.3 g/dL — ABNORMAL LOW (ref 13.0–17.0)
MCH: 27.8 pg (ref 26.0–34.0)
MCHC: 29.9 g/dL — AB (ref 30.0–36.0)
MCV: 93 fL (ref 80.0–100.0)
Platelets: 200 10*3/uL (ref 150–400)
RBC: 4.42 MIL/uL (ref 4.22–5.81)
RDW: 15.3 % (ref 11.5–15.5)
WBC: 6.8 10*3/uL (ref 4.0–10.5)
nRBC: 0 % (ref 0.0–0.2)

## 2018-04-03 LAB — BASIC METABOLIC PANEL
Anion gap: 6 (ref 5–15)
BUN: 13 mg/dL (ref 6–20)
CALCIUM: 8.9 mg/dL (ref 8.9–10.3)
CO2: 23 mmol/L (ref 22–32)
CREATININE: 1.12 mg/dL (ref 0.61–1.24)
Chloride: 109 mmol/L (ref 98–111)
GFR calc Af Amer: >60 mL/min (ref >60)
GLUCOSE: 101 mg/dL — AB (ref 70–99)
Potassium: 3.9 mmol/L (ref 3.5–5.1)
Sodium: 138 mmol/L (ref 135–145)

## 2018-04-03 LAB — PROTIME-INR
INR: 2.76
Prothrombin Time: 28.8 seconds — ABNORMAL HIGH (ref 11.4–15.2)

## 2018-04-03 LAB — LACTATE DEHYDROGENASE: LDH: 240 U/L — AB (ref 98–192)

## 2018-04-03 MED ORDER — DOXYCYCLINE HYCLATE 100 MG PO CAPS: 100.0000 mg | ORAL_CAPSULE | Freq: Two times a day (BID) | ORAL | 0 refills | Status: DC

## 2018-04-03 MED ORDER — DOCUSATE SODIUM 100 MG PO CAPS: 100.0000 mg | ORAL_CAPSULE | Freq: Two times a day (BID) | ORAL | 0 refills | Status: DC

## 2018-04-03 MED ORDER — SILDENAFIL CITRATE 20 MG PO TABS: 20.0000 mg | ORAL_TABLET | Freq: Three times a day (TID) | ORAL | 3 refills | Status: DC

## 2018-04-03 MED ORDER — WARFARIN SODIUM 5 MG PO TABS: ORAL_TABLET | ORAL | 3 refills | Status: DC

## 2018-04-03 MED ORDER — WARFARIN SODIUM 5 MG PO TABS: 5.0000 mg | ORAL_TABLET | ORAL | 3 refills | Status: DC

## 2018-04-03 NOTE — Progress Notes (Signed)
Patient presents for 1 month follow up in VAD Clinic today. Reports no problems with VAD equipment or concerns with drive line.   Patient has been changing his own dressing twice a week for the last two weeks. He has moderate amount of green drainage, and very little tissue ingrowth. Will start Doxy 100 mg bid for 7 days. He states that he has a new "friend" that is a Charity fundraiser who is willing to change his drive line dressing for him. Will have him bring her to clinic on Monday for his wound check to teach dressing change.   Patient states that his girlfriend has told him that he "stops breathing in his sleep, and snores a lot." Will schedule for sleep study.   States that he has been constipated over the last few weeks. Has blood when wiping x1 4 days ago. Has had bowel movements since, and has not seen any more blood.   He has taken Torsamide for the last 3 days due to shortness of breath. Has some wheezing upon auscultation. He admits to smoking 2-3 cigarettes a day. Educated on smoking cessation.   Vital Signs:  Doppler Pressure:  96 Automatc BP: 109/76 (88) HR:  87 SPO2: 97% on RA  Weight: 220.6  lbs w/o eqt Last weight: 213.2  lb  VAD Indication: Destination Therapy   VAD interrogation & Equipment Management: Speed: 5600 Flow: 4.7 Power: 4.3w    PI: 4.5 Hct: 37 Alarms:  Few low voltage. 1 no external power- breaker tripped when his daughter turned on space heater in her room.  Events: 0 - 5  PI events     50 PI events on 11/15  Fixed speed 5600 Low speed limit: 5300  Primary Controller:  Replace back up battery in 28 months. Back up controller:   Replace back up battery in 28 months.  Annual Equipment Maintenance on [facility]/PM was performed on 4/19.   I reviewed the LVAD parameters from today and compared the results to the patient's prior recorded data. LVAD interrogation was NEGATIVE for significant power changes, NEGATIVE for clinical alarms and STABLE for PI events/speed  drops. No programming changes were made and pump is functioning within specified parameters. Pt is performing daily controller and system monitor self tests along with completing weekly and monthly maintenance for LVAD equipment.  LVAD equipment check completed and is in good working order. Back-up equipment present.   Exit Site Care: Drive line is being maintain twice weekly to weekly by patient.  Existing VAD dressing removed and site care performed using sterile technique. Drive line exit site cleaned with Chlora prep applicators x 2, allowed to dry, and gauze dressing with silver strip re-applied. Drive line exit site unincorporated, the velour is fully implanted at exit site. No erythema, silver strip saturated with dried dark drainage, moderate amount green drainage on gauze dressing, no redness, tenderness, or foul odor. Stabilization device present and accurately applied- changed positioning to help with tissue ingrowth. Pt denies fever or chills. Pt given 10 daily dressing kits and 5 anchors and a bottle of silver strips.  Will have patient change dressing daily until wound check on Monday.    Significant Events on VAD Support:   Device:none  BP & Labs:  Doppler 96 - reflecting MAP  Hgb  12.3- No S/S of bleeding. Specifically denies melena/BRBPR or nosebleeds.  LDH stable at 240 with established baseline of 200-300. Denies tea-colored urine. No power elevations noted on interrogation.   Patient Instructions:  1.  Continue Torsemide to 20 mg as needed only. 3. Daily dressing changes until wound check on Monday.   4. Take Doxycycline 100 mg twice daily for 7 days. (May need to switch to Keflex if drainage not improved by Monday) 5. Start Colace as needed for constipation.  6. Return to VAD clinic on Monday for wound check. 7. Return to VAD clinic in 1 month. 8. Will schedule for sleep study.    Alyce Pagan RN VAD Coordinator  Office: [phone]  24/7 Pager:  [phone]

## 2018-04-03 NOTE — Patient Instructions (Addendum)
1. Continue taking Lasix as needed.    2. Doxycycline 100mg  twice a day for 10 days 3. Change drive line dressing every day with silver strip.  4. Will get you set up for a sleep study. 5. Return to clinic in 1 week for wound check.  6. 1 month follow up in VAD clinic 7. May take Colace as needed for constipation.

## 2018-04-03 NOTE — Progress Notes (Signed)
Advanced Heart Failure VAD Clinic Note   Primary cardiologist: Harwani Primary HF: Dr. [name]  HPI:  Jonathan Wood is a 47 y.o. 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   Admitted 08/25/17 with ADHF and cardiogenic shock. Started on IV lasix and milrinone 0.125 mcg/kg/min with over 27 lbs of diuresis.   Echo showed EF 5-10%, with mild RV dilation, and mod to severe reduction in function.Case discussed with [facility] transplnt team but not candidate for transplant due to ongoing tobacco use. Underwent HM3 LVAD 09/06/17 without any immediate post-op complications. 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  Started on neurontin for pocket pain. Progressed will with PT and thought stable for home on discharge.  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 CT with soft-tissue stranding. No abscess. Improved with IV abx and finishe po abx.   Follow up for Heart Failure/LVAD:  He presents today for regular follow up. Last visit lasix changed to as needed.  He states he has taken the past 3 days. Also started on doxy for driveline drainage. Of note, multiple PI events on 03/31/18. Has a new GF who is an Charity fundraiser and wants to start helping him with his dressing change. He also states she says he snores terribly and has apparent apneic episodes. No VAD alarms. Continues to have driveline drainage. Says it is hard to secure the VAD at night. No foul smell or fever. No discharge expressed on exam, only on dressing. He did report episode of blood with wiping last week in setting of supratherapeutic INR. Continues to smoked 2-4 cigarettes a day.   VAD Indication: Destination Therapy   VAD interrogation & Equipment Management: Speed: 5600 Flow: 4.7 Power: 4.3 PI: 4.5 Hct: 37 Alarms: Several Low voltage. One no ext power 04/01/18 Events: 0-5 PI events, though 50+ on 11/15. States he didn't have  very much to drink that day, or in general.   Fixed speed: 5600 Low speed limit: 5300  Primary Controller: Replace back up battery in 28 months. Back up controller: Replace back up battery in 28 months.  Annual Equipment Maintenance on UBC/PM was performed on 4/19.  Reports taking Coumadin as prescribed and adherence to anticoagulation based dietary restrictions.  Denies bright red blood per rectum or melena, no dark urine or hematuria.     Past Medical History:  Diagnosis Date  . Asthma   . CHF (congestive heart failure) (HCC)    a. 09/2016: EF 20-25% with cath showing normal cors  . GERD (gastroesophageal reflux disease)   . History of hiatal hernia     Current Outpatient Medications  Medication Sig Dispense Refill  . amiodarone (PACERONE) 200 MG tablet 200 mg daily (Patient taking differently: Take 200 mg by mouth daily. ) 36 tablet 3  . aspirin EC 81 MG tablet Take 1 tablet (81 mg total) by mouth daily. 30 tablet 1  . citalopram (CELEXA) 10 MG tablet Take 1 tablet (10 mg total) by mouth daily. 30 tablet 3  . gabapentin (NEURONTIN) 300 MG capsule Take 1 capsule (300 mg total) by mouth 3 (three) times daily. 90 capsule 6  . pantoprazole (PROTONIX) 40 MG tablet Take 1 tablet (40 mg total) by mouth daily. 30 tablet 6  . potassium chloride SA (KLOR-CON M20) 20 MEQ tablet Take 1 tablet (20 mEq total) by mouth daily. 90 tablet 3  . sacubitril-valsartan (ENTRESTO) 49-51  MG Take 1 tablet by mouth 2 (two) times daily. 60 tablet 6  . sildenafil (REVATIO) 20 MG tablet Take 1 tablet (20 mg total) by mouth 3 (three) times daily. 90 tablet 3  . torsemide (DEMADEX) 20 MG tablet Take 20 mg by mouth as needed.    . traMADol (ULTRAM) 50 MG tablet Take 1 tablet (50 mg total) by mouth every 6 (six) hours as needed. 20 tablet 0  . traZODone (DESYREL) 50 MG tablet Take 1 tablet (50 mg total) by mouth at bedtime. 30 tablet 4  . warfarin (COUMADIN) 5 MG tablet Take 1 1/2 tablets (7.5 mg) by mouth   Wednesday and Friday at 6pm, take 1 tablet (5 mg) on Sunday, Monday, Tuesday, Thursday, Saturday @ 6pm 180 tablet 3  . doxycycline (VIBRAMYCIN) 50 MG capsule Take 2 capsules (100 mg total) by mouth 2 (two) times daily. (Patient not taking: Reported on 04/03/2018) 28 capsule 0  . enoxaparin (LOVENOX) 40 MG/0.4ML injection Inject 0.4 mLs (40 mg total) into the skin every 12 (twelve) hours. (Patient not taking: Reported on 03/06/2018)     No current facility-administered medications for this encounter.    Patient has no known allergies.  Review of systems complete and found to be negative unless listed in HPI.    Vitals:   04/03/18 1009 04/03/18 1017  BP: (!) 96/0 109/76  Pulse: 87   Weight: 100.1 kg (220 lb 9.6 oz)     Wt Readings from Last 3 Encounters:  04/03/18 100.1 kg (220 lb 9.6 oz)  03/06/18 96.7 kg (213 lb 3.2 oz)  02/20/18 97.9 kg (215 lb 12.8 oz)    Vital Signs:  Doppler Pressure:  96 Automatc BP: 109/76 (88) HR:  87 SPO2: Not available.   Weight: 200 lbs with eqt Last weight: 213 lbs  Physical Exam: General: Well appearing this am. NAD.  HEENT: Normal. Neck: Supple, JVP 7-8 cm. Carotids OK.  Cardiac:  Mechanical heart sounds with LVAD hum present.  Lungs:  CTAB, normal effort. No wheeze Abdomen:  NT, ND, no HSM. No bruits or masses. +BS  LVAD exit site: Not incorporated.  Dressing dry and intact. + drainage on dressing. Stabilization device present and accurately applied. Driveline dressing changed twice weekly per sterile technique. Extremities: no cyanosis, clubbing, rash, edema Neuro: alert & oriented x 3, cranial nerves grossly intact. moves all 4 extremities w/o difficulty. Affect pleasant  ASSESSMENT AND PLAN:   1. Chronic systolic HF - EF 16% s/p HM-3 LVAD on 09/06/17 - NYHA I-II symptoms - Volume status stable on exam.   - Continue Entresto 49/51 mg BID.   - Continue torsemide as needed, if fluid trends up can go back to Monday and Friday with extra  as needed. - Have begun referral process to [facility] for transplant. He is still smoking daily. Pt aware he needs 6 months no smoking for transplant consideration.  Reinforced fluid restriction to < 2 L daily, sodium restriction to less than 2000 mg daily, and the importance of daily weights.    2. HM3 LVAD 09/06/2017. - VAD interrogated personally. Parameters stable.   - As above, driveline site still unincorporated with mild drainage. Wil restart doxy  - LDH 240 - INR goal 2.0-2.5. INR 2.76. Discussed dosing with PharmD personally. Will recheck one week.  - Continue ASA  - Blood pressure well controlled. Continue current regimen.  3. H/o Driveline infection - Give doxy 100 mg BID x 10 days. Not enough to culture.  -  Suspect continued repetitive motion trauma + ? Hygeine/technique. Re-educated in clinic today, and plan to have a potential care giver come next week for instruction.   4. Hypokalemia - K 3.9 today. Continue current regimen of K 20 meq daily.    5. Anxiety - Much improved on Celexa.  - No change to current plan.   - Ok to use trazadone for sleep  6. Essential HTN - MAPs stable on current regimen.   Graciella Freer, PA-C  10:53 AM   Patient seen and examined with the above-signed Advanced Practice Provider and/or Housestaff. I personally reviewed laboratory data, imaging studies and relevant notes. I independently examined the patient and formulated the important aspects of the plan. I have edited the note to reflect any of my changes or salient points. I have personally discussed the plan with the patient and/or family.  Doing well with VAD. NYHA I-II. MAPs improved. Volume status looks ok. Taking diuretics as needed.  Discussed with him not to overdo it. VAD interrogated personally. Parameters stable.  Has persistent driveline drainage. Suspect due to ongoing trauma. Long discussion about need to secure controller better at night. Anchor positioning changed in  clinic. Restart doxy. Daily dressing changes.   INR 2.76. Discussed dosing with PharmD personally. LDH ok.   Counseled on need to stop smoking to permit transplant eval.   Arvilla Meres, MD  7:05 PM

## 2018-04-04 ENCOUNTER — Other Ambulatory Visit (HOSPITAL_COMMUNITY): Payer: Self-pay | Admitting: *Deleted

## 2018-04-04 DIAGNOSIS — Z95811 Presence of heart assist device: Secondary | ICD-10-CM

## 2018-04-04 DIAGNOSIS — R0683 Snoring: Secondary | ICD-10-CM

## 2018-04-05 ENCOUNTER — Encounter (HOSPITAL_COMMUNITY): Payer: Medicaid Other

## 2018-04-05 ENCOUNTER — Other Ambulatory Visit (HOSPITAL_COMMUNITY): Payer: Self-pay | Admitting: *Deleted

## 2018-04-05 DIAGNOSIS — Z7901 Long term (current) use of anticoagulants: Secondary | ICD-10-CM

## 2018-04-05 DIAGNOSIS — Z95811 Presence of heart assist device: Secondary | ICD-10-CM

## 2018-04-06 LAB — NICOTINE/COTININE METABOLITES
Cotinine: 168.1 ng/mL
NICOTINE: 16.7 ng/mL

## 2018-04-07 ENCOUNTER — Encounter (HOSPITAL_COMMUNITY): Payer: Medicaid Other

## 2018-04-10 ENCOUNTER — Encounter (HOSPITAL_COMMUNITY): Payer: Medicaid Other

## 2018-04-10 ENCOUNTER — Ambulatory Visit (HOSPITAL_COMMUNITY): Payer: Self-pay | Admitting: Pharmacist

## 2018-04-10 ENCOUNTER — Ambulatory Visit (HOSPITAL_COMMUNITY)
Admission: RE | Admit: 2018-04-10 | Discharge: 2018-04-10 | Disposition: A | Discharge status: Home / Self Care | Payer: Medicaid Other | Source: Ambulatory Visit | Attending: Cardiology | Admitting: Cardiology

## 2018-04-10 DIAGNOSIS — Z7901 Long term (current) use of anticoagulants: Secondary | ICD-10-CM | POA: Diagnosis not present

## 2018-04-10 DIAGNOSIS — Z95811 Presence of heart assist device: Secondary | ICD-10-CM

## 2018-04-10 DIAGNOSIS — T827XXA Infection and inflammatory reaction due to other cardiac and vascular devices, implants and grafts, initial encounter: Secondary | ICD-10-CM

## 2018-04-10 LAB — PROTIME-INR
INR: 2.99
Prothrombin Time: 30.6 seconds — ABNORMAL HIGH (ref 11.4–15.2)

## 2018-04-10 MED ORDER — CEPHALEXIN 500 MG PO CAPS: 500.0000 mg | ORAL_CAPSULE | Freq: Three times a day (TID) | ORAL | 0 refills | Status: AC

## 2018-04-10 MED ORDER — DOXYCYCLINE HYCLATE 100 MG PO CAPS: 100.0000 mg | ORAL_CAPSULE | Freq: Two times a day (BID) | ORAL | 0 refills | Status: DC

## 2018-04-10 NOTE — Addendum Note (Signed)
Encounter addended by: Lebron Quam, RN on: 04/10/2018 1:22 PM  Actions taken: Pharmacy for encounter modified, Order list changed, Sign clinical note

## 2018-04-10 NOTE — Addendum Note (Signed)
Encounter addended by: Lebron Quam, RN on: 04/10/2018 12:38 PM  Actions taken: Pharmacy for encounter modified, Order list changed, Diagnosis association updated, Sign clinical note

## 2018-04-10 NOTE — Addendum Note (Signed)
Encounter addended by: Lebron Quam, RN on: 04/10/2018 1:10 PM  Actions taken: Sign clinical note

## 2018-04-10 NOTE — Progress Notes (Addendum)
Pt presents to clinic for wound check and labs.  Exit Site Care: Drive line is being maintain twice weekly to weekly by patient and Christy.  Existing VAD dressing removed and site care performed using sterile technique. Drive line exit site cleaned with Chlora prep applicators x 2, allowed to dry, and gauze dressing with silver strip re-applied. Drive line exit site unincorporated, the velour is fully implanted at exit site. No erythema, moderate amount green drainage on gauze dressing, no redness, tenderness, or foul odor. Stabilization device present and accurately applied- changed positioning to help with tissue ingrowth. Pt denies fever or chills. Pt given 7 daily dressing kits.  Will have patient change dressing every other day.        Patient Instructions:  1. Change to Keflex 500 mg tid for 7 days.  2. Change dressing every other day. 3. Return to clinic in 1 week for wound check.  Carlton Adam RN VAD Coordinator  Office: [phone]  24/7 Pager: [phone]

## 2018-04-12 ENCOUNTER — Other Ambulatory Visit (HOSPITAL_COMMUNITY): Payer: Self-pay | Admitting: Unknown Physician Specialty

## 2018-04-12 ENCOUNTER — Encounter (HOSPITAL_COMMUNITY): Payer: Medicaid Other

## 2018-04-12 DIAGNOSIS — Z7901 Long term (current) use of anticoagulants: Secondary | ICD-10-CM

## 2018-04-12 DIAGNOSIS — Z95811 Presence of heart assist device: Secondary | ICD-10-CM

## 2018-04-14 ENCOUNTER — Encounter (HOSPITAL_COMMUNITY): Payer: Medicaid Other

## 2018-04-17 ENCOUNTER — Inpatient Hospital Stay (HOSPITAL_COMMUNITY): Admission: RE | Admit: 2018-04-17 | Payer: Medicaid Other | Source: Ambulatory Visit

## 2018-04-17 ENCOUNTER — Encounter (HOSPITAL_COMMUNITY): Payer: Medicaid Other

## 2018-04-19 ENCOUNTER — Other Ambulatory Visit (HOSPITAL_COMMUNITY): Payer: Self-pay | Admitting: Student

## 2018-04-19 ENCOUNTER — Encounter (HOSPITAL_COMMUNITY): Payer: Medicaid Other

## 2018-04-21 ENCOUNTER — Other Ambulatory Visit (HOSPITAL_COMMUNITY): Payer: Self-pay | Admitting: Unknown Physician Specialty

## 2018-04-21 ENCOUNTER — Encounter (HOSPITAL_COMMUNITY): Payer: Medicaid Other

## 2018-04-21 ENCOUNTER — Other Ambulatory Visit (HOSPITAL_COMMUNITY)
Admission: RE | Admit: 2018-04-21 | Discharge: 2018-04-21 | Disposition: A | Discharge status: Home / Self Care | Payer: Medicaid Other | Source: Ambulatory Visit | Attending: Internal Medicine | Admitting: Internal Medicine

## 2018-04-21 ENCOUNTER — Ambulatory Visit (HOSPITAL_COMMUNITY): Payer: Self-pay | Admitting: Pharmacist

## 2018-04-21 DIAGNOSIS — Z95811 Presence of heart assist device: Secondary | ICD-10-CM | POA: Insufficient documentation

## 2018-04-21 DIAGNOSIS — Z7901 Long term (current) use of anticoagulants: Secondary | ICD-10-CM

## 2018-04-21 LAB — PROTIME-INR
INR: 2.49
PROTHROMBIN TIME: 26.5 s — AB (ref 11.4–15.2)

## 2018-04-24 ENCOUNTER — Ambulatory Visit (HOSPITAL_COMMUNITY): Payer: Self-pay | Admitting: Pharmacist

## 2018-04-24 ENCOUNTER — Encounter (HOSPITAL_COMMUNITY): Payer: Self-pay

## 2018-04-24 ENCOUNTER — Other Ambulatory Visit (HOSPITAL_COMMUNITY): Payer: Self-pay | Admitting: *Deleted

## 2018-04-24 ENCOUNTER — Ambulatory Visit (HOSPITAL_COMMUNITY)
Admission: RE | Admit: 2018-04-24 | Discharge: 2018-04-24 | Disposition: A | Discharge status: Home / Self Care | Payer: Medicaid Other | Source: Ambulatory Visit | Attending: Cardiology | Admitting: Cardiology

## 2018-04-24 ENCOUNTER — Encounter (HOSPITAL_COMMUNITY): Payer: Medicaid Other

## 2018-04-24 VITALS — BP 112/72 | HR 88 | Wt 230.6 lb

## 2018-04-24 DIAGNOSIS — R0683 Snoring: Secondary | ICD-10-CM

## 2018-04-24 DIAGNOSIS — F419 Anxiety disorder, unspecified: Secondary | ICD-10-CM | POA: Insufficient documentation

## 2018-04-24 DIAGNOSIS — Z79891 Long term (current) use of opiate analgesic: Secondary | ICD-10-CM | POA: Diagnosis not present

## 2018-04-24 DIAGNOSIS — I5022 Chronic systolic (congestive) heart failure: Secondary | ICD-10-CM | POA: Insufficient documentation

## 2018-04-24 DIAGNOSIS — Z7982 Long term (current) use of aspirin: Secondary | ICD-10-CM | POA: Diagnosis not present

## 2018-04-24 DIAGNOSIS — I5032 Chronic diastolic (congestive) heart failure: Secondary | ICD-10-CM

## 2018-04-24 DIAGNOSIS — E876 Hypokalemia: Secondary | ICD-10-CM | POA: Diagnosis not present

## 2018-04-24 DIAGNOSIS — Z95811 Presence of heart assist device: Secondary | ICD-10-CM

## 2018-04-24 DIAGNOSIS — T827XXA Infection and inflammatory reaction due to other cardiac and vascular devices, implants and grafts, initial encounter: Secondary | ICD-10-CM | POA: Diagnosis not present

## 2018-04-24 DIAGNOSIS — Z7901 Long term (current) use of anticoagulants: Secondary | ICD-10-CM

## 2018-04-24 DIAGNOSIS — I110 Hypertensive heart disease with heart failure: Secondary | ICD-10-CM | POA: Diagnosis not present

## 2018-04-24 DIAGNOSIS — I10 Essential (primary) hypertension: Secondary | ICD-10-CM

## 2018-04-24 DIAGNOSIS — Z79899 Other long term (current) drug therapy: Secondary | ICD-10-CM | POA: Diagnosis not present

## 2018-04-24 DIAGNOSIS — Z720 Tobacco use: Secondary | ICD-10-CM

## 2018-04-24 LAB — LACTATE DEHYDROGENASE: LDH: 224 U/L — AB (ref 98–192)

## 2018-04-24 LAB — BASIC METABOLIC PANEL
Anion gap: 8 (ref 5–15)
BUN: 10 mg/dL (ref 6–20)
CALCIUM: 8.8 mg/dL — AB (ref 8.9–10.3)
CO2: 21 mmol/L — AB (ref 22–32)
Chloride: 110 mmol/L (ref 98–111)
Creatinine, Ser: 1.1 mg/dL (ref 0.61–1.24)
GFR calc Af Amer: >60 mL/min (ref >60)
GLUCOSE: 178 mg/dL — AB (ref 70–99)
Potassium: 4.1 mmol/L (ref 3.5–5.1)
Sodium: 139 mmol/L (ref 135–145)

## 2018-04-24 LAB — CBC
HEMATOCRIT: 40 % (ref 39.0–52.0)
Hemoglobin: 11.7 g/dL — ABNORMAL LOW (ref 13.0–17.0)
MCH: 27.2 pg (ref 26.0–34.0)
MCHC: 29.3 g/dL — ABNORMAL LOW (ref 30.0–36.0)
MCV: 93 fL (ref 80.0–100.0)
Platelets: 158 10*3/uL (ref 150–400)
RBC: 4.3 MIL/uL (ref 4.22–5.81)
RDW: 15.6 % — AB (ref 11.5–15.5)
WBC: 6 10*3/uL (ref 4.0–10.5)
nRBC: 0 % (ref 0.0–0.2)

## 2018-04-24 LAB — PROTIME-INR
INR: 2.6
PROTHROMBIN TIME: 27.4 s — AB (ref 11.4–15.2)

## 2018-04-24 MED ORDER — DOCUSATE SODIUM 100 MG PO CAPS: 100.0000 mg | ORAL_CAPSULE | Freq: Two times a day (BID) | ORAL | 2 refills | Status: DC

## 2018-04-24 NOTE — Progress Notes (Signed)
CSW met with patient in the clinic. Patient reports he met with his lawyer regarding the appeal for Social Security and the process "is moving along". Patient continues to struggle financially and asked for a [employer] gift card for gas expenses to medical appointments. CSW provided card and continues to be available for patient needs. Patient grateful for assistance and support. [name], [place], [place]

## 2018-04-24 NOTE — Progress Notes (Signed)
Advanced Heart Failure VAD Clinic Note   Primary cardiologist: Harwani Primary HF: Dr. [name]  HPI:  Jonathan Wood is a 47 y.o. 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   Admitted 08/25/17 with ADHF and cardiogenic shock. Started on IV lasix and milrinone 0.125 mcg/kg/min with over 27 lbs of diuresis.   Echo showed EF 5-10%, with mild RV dilation, and mod to severe reduction in function.Case discussed with [facility] transplnt team but not candidate for transplant due to ongoing tobacco use. Underwent HM3 LVAD 09/06/17 without any immediate post-op complications. 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  Started on neurontin for pocket pain. Progressed will with PT and thought stable for home on discharge.  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 CT with soft-tissue stranding. No abscess. Improved with IV abx and finished po abx.   Follow up for Heart Failure/LVAD:  He presents today for regular follow up. Feeling great. Fluid was elevated at last visit so we encouraged him to take torsemide as needed. Took it 4 times last week and feel he is managing it better now. Also had some driveline drainage due to nighttime trauma. Treated with doxy and driveline anchored above site. Now wearing compressive-type device at night to keep in place. Denies orthopnea or PND. No fevers or chills. No bleeding, melena or neuro symptoms. No VAD alarms. Taking all meds as prescribed. Working on smoking cessation. Gaining some weight.    VAD Indication: Destination Therapy   VAD interrogation & Equipment Management: Speed: 5600 Flow: 4.9 Power: 4.2w PI: 3.7 Hct: 37 Alarms: Frequent low voltage Events: 5-15 PI events daily  Fixed speed 5600 Low speed limit: 5300  Primary Controller: Replace back up battery in 26 months. Back up controller: Replace back up battery in 28  months.  Annual Equipment Maintenance on UBC/PM was performed on 4/19.  Annual Equipment Maintenance on UBC/PM was performed on 4/19.  Reports taking Coumadin as prescribed and adherence to anticoagulation based dietary restrictions.  Denies bright red blood per rectum or melena, no dark urine or hematuria.     Past Medical History:  Diagnosis Date  . Asthma   . CHF (congestive heart failure) (HCC)    a. 09/2016: EF 20-25% with cath showing normal cors  . GERD (gastroesophageal reflux disease)   . History of hiatal hernia     Current Outpatient Medications  Medication Sig Dispense Refill  . amiodarone (PACERONE) 200 MG tablet Take 1 tablet (200 mg total) by mouth daily. 30 tablet 6  . aspirin EC 81 MG tablet Take 1 tablet (81 mg total) by mouth daily. 30 tablet 1  . citalopram (CELEXA) 10 MG tablet TAKE 1 TABLET BY MOUTH ONCE DAILY 90 tablet 0  . gabapentin (NEURONTIN) 300 MG capsule Take 1 capsule (300 mg total) by mouth 3 (three) times daily. 90 capsule 6  . pantoprazole (PROTONIX) 40 MG tablet Take 1 tablet (40 mg total) by mouth daily. 30 tablet 6  . potassium chloride SA (KLOR-CON M20) 20 MEQ tablet Take 1 tablet (20 mEq total) by mouth daily. 90 tablet 3  . sacubitril-valsartan (ENTRESTO) 49-51 MG Take 1 tablet by mouth 2 (two) times daily. 60 tablet 6  . sildenafil (REVATIO) 20 MG tablet Take 1 tablet (20 mg total) by mouth 3 (three) times daily. 90 tablet 3  . torsemide (DEMADEX) 20 MG tablet Take 20 mg  by mouth as needed.    . traZODone (DESYREL) 50 MG tablet Take 1 tablet (50 mg total) by mouth at bedtime. 30 tablet 4  . warfarin (COUMADIN) 5 MG tablet Take 5 mg by mouth daily.    Marland Kitchen docusate sodium (COLACE) 100 MG capsule Take 1 capsule (100 mg total) by mouth 2 (two) times daily. 10 capsule 2  . enoxaparin (LOVENOX) 40 MG/0.4ML injection Inject 0.4 mLs (40 mg total) into the skin every 12 (twelve) hours. (Patient not taking: Reported on 03/06/2018)    . traMADol  (ULTRAM) 50 MG tablet Take 1 tablet (50 mg total) by mouth every 6 (six) hours as needed. (Patient not taking: Reported on 04/24/2018) 20 tablet 0   No current facility-administered medications for this encounter.    Patient has no known allergies.  Review of systems complete and found to be negative unless listed in HPI.    Vitals:   04/24/18 1126 04/24/18 1127  BP: (!) 104/0 112/72  Pulse: 88   SpO2: 97%   Weight: 104.6 kg (230 lb 9.6 oz)     Wt Readings from Last 3 Encounters:  04/24/18 104.6 kg (230 lb 9.6 oz)  04/03/18 100.1 kg (220 lb 9.6 oz)  03/06/18 96.7 kg (213 lb 3.2 oz)    Vital Signs:  Doppler Pressure:  104 Automatc BP: 112/72 (89) HR: 88 SPO2: 97% on RA  Weight: 230.6  lbs w/o eqt Last weight: 220.6  lb   Physical Exam: General:  NAD.  HEENT: normal  Neck: supple. JVP not elevated.  Carotids 2+ bilat; no bruits. No lymphadenopathy or thryomegaly appreciated. Cor: LVAD hum.  Lungs: Clear. Abdomen: obese soft, nontender, non-distended. No hepatosplenomegaly. No bruits or masses. Good bowel sounds. Driveline site clean. Anchor in place.  Extremities: no cyanosis, clubbing, rash. Warm no edema  Neuro: alert & oriented x 3. No focal deficits. Moves all 4 without problem   ASSESSMENT AND PLAN:   1. Chronic systolic HF - EF 04% s/p HM-3 LVAD on 09/06/17 - Doing very well. NYHA I symptoms on VAD support  - Volume status stable on exam.   - Continue Entresto 49/51 mg BID.   - Continue torsemide as needed, if fluid trends up can go back to Monday and Friday with extra as needed. - Have begun referral process to [facility] for [facility]. He is still smoking daily. Pt aware he needs 6 months no smoking for transplant consideration. Discussed pros/cons of staying with VAD vs going to transplant.  Reinforced fluid restriction to < 2 L daily, sodium restriction to less than 2000 mg daily, and the importance of daily weights.    2. HM3 LVAD 09/06/2017.  - VAD  interrogated personally. Parameters stable. - Driveline site improved with doxy and improved anchoring. - LDH 224 - INR goal 2.0-2.5. INR 2.60. Discussed dosing with PharmD personally. - Continue ASA  - Blood pressure well controlled. Continue current regimen.  3. H/o Driveline infection - Resolved. See above.  4. Hypokalemia - K 4.1 today. Continue current regimen of K 20 meq daily.    5. Anxiety - Much improved on Celexa.  - No change to current plan.   - Ok to use trazadone for sleep  6. Essential HTN - Blood pressure well controlled. Continue current regimen.  Total time spent 35 minutes. Over half that time spent discussing above.    Arvilla Meres, MD  12:21 PM

## 2018-04-24 NOTE — Progress Notes (Signed)
Patient presents for 1 month follow up in VAD Clinic today. Reports no problems with VAD equipment or concerns with drive line.   States that he has been constipated and straining over the last few weeks. Has blood when wiping x3 episodes. Has had bowel movements since, and has not seen any more blood. Instructed him to take Colace as ordered (he reports he has not been taking this.)  He has taken Torsamide 4 days last week due to shortness of breath. Reports that smoking cessation is going well.   Vital Signs:  Doppler Pressure:  104 Automatc BP: 112/72 (89) HR: 88 SPO2: 97% on RA  Weight: 230.6  lbs w/o eqt Last weight: 220.6  lb  VAD Indication: Destination Therapy   VAD interrogation & Equipment Management: Speed: 5600 Flow: 4.9 Power: 4.2w    PI: 3.7 Hct: 37 Alarms: Frequent low voltage Events: 5-15 PI events daily  Fixed speed 5600 Low speed limit: 5300  Primary Controller:  Replace back up battery in 26 months. Back up controller:   Replace back up battery in 28 months.  Annual Equipment Maintenance on UBC/PM was performed on 4/19.   I reviewed the LVAD parameters from today and compared the results to the patient's prior recorded data. LVAD interrogation was NEGATIVE for significant power changes, NEGATIVE for clinical alarms and STABLE for PI events/speed drops. No programming changes were made and pump is functioning within specified parameters. Pt is performing daily controller and system monitor self tests along with completing weekly and monthly maintenance for LVAD equipment.  LVAD equipment check completed and is in good working order. Back-up equipment present.   Exit Site Care: Drive line is being maintain three times a week by [employer].  Existing VAD dressing removed and site care performed using sterile technique. Drive line exit site cleaned with Chlora prep applicators x 2, allowed to dry, and gauze dressing with silver strip re-applied. Drive line  exit site unincorporated, the velour is fully implanted at exit site. No erythema, scant yellow drainage on silver strip and gauze, no redness, tenderness, or foul odor. Stabilization device present and accurately applied.Pt denies fever or chills. Patient states he has dressing kits at home. Will advance dressing changes to twice a week. Will check site when he comes to see Annice Pih next week.    Significant Events on VAD Support:   Device:none  BP & Labs:  Doppler 104 - reflecting modified systolic  Hgb 11.7 - No S/S of bleeding. Specifically denies melena/BRBPR or nosebleeds.  LDH stable at pending with established baseline of 200-300. Denies tea-colored urine. No power elevations noted on interrogation.   Patient Instructions:  1. Continue Torsemide to 20 mg as needed only. 2. Start Colace for constipation. Instructed patient to pick this up on his way home.  3. Twice weekly dressing changes.  4. Return to VAD clinic in 6 weeks for f/u.  5. Will schedule for sleep study.    Alyce Pagan RN VAD Coordinator  Office: [phone]  24/7 Pager: [phone]

## 2018-04-24 NOTE — Addendum Note (Signed)
Encounter addended by: Marcy Siren, LCSW on: 04/24/2018 1:43 PM  Actions taken: Sign clinical note

## 2018-04-25 ENCOUNTER — Encounter (HOSPITAL_COMMUNITY): Payer: Self-pay | Admitting: Unknown Physician Specialty

## 2018-04-26 ENCOUNTER — Encounter (HOSPITAL_COMMUNITY): Payer: Medicaid Other

## 2018-04-28 ENCOUNTER — Encounter (HOSPITAL_COMMUNITY): Payer: Medicaid Other

## 2018-05-02 ENCOUNTER — Ambulatory Visit: Payer: Medicaid Other | Attending: Internal Medicine | Admitting: Cardiology

## 2018-05-02 ENCOUNTER — Telehealth (HOSPITAL_COMMUNITY): Payer: Self-pay | Admitting: Pharmacist

## 2018-05-02 DIAGNOSIS — R0683 Snoring: Secondary | ICD-10-CM | POA: Diagnosis not present

## 2018-05-02 DIAGNOSIS — Z95811 Presence of heart assist device: Secondary | ICD-10-CM | POA: Insufficient documentation

## 2018-05-02 DIAGNOSIS — G4733 Obstructive sleep apnea (adult) (pediatric): Secondary | ICD-10-CM

## 2018-05-02 NOTE — Telephone Encounter (Signed)
Jonathan Wood will get INR checked at [facility] on Friday.

## 2018-05-03 ENCOUNTER — Ambulatory Visit (HOSPITAL_COMMUNITY): Payer: Self-pay | Admitting: Pharmacist

## 2018-05-03 ENCOUNTER — Other Ambulatory Visit (HOSPITAL_COMMUNITY)
Admission: RE | Admit: 2018-05-03 | Discharge: 2018-05-03 | Disposition: A | Discharge status: Home / Self Care | Payer: Medicaid Other | Source: Ambulatory Visit | Attending: Internal Medicine | Admitting: Internal Medicine

## 2018-05-03 DIAGNOSIS — Z7901 Long term (current) use of anticoagulants: Secondary | ICD-10-CM | POA: Diagnosis present

## 2018-05-03 DIAGNOSIS — Z95811 Presence of heart assist device: Secondary | ICD-10-CM | POA: Insufficient documentation

## 2018-05-03 LAB — PROTIME-INR
INR: 2.22
Prothrombin Time: 24.3 seconds — ABNORMAL HIGH (ref 11.4–15.2)

## 2018-05-08 ENCOUNTER — Encounter (HOSPITAL_COMMUNITY): Payer: Medicaid Other

## 2018-05-15 NOTE — Procedures (Signed)
Patient Name: Jonathan Wood, Jonathan Wood Date: 05/02/2018 Gender: Male D.O.B: [birth year] Age (years): 47 Referring Provider: Shaune Pascal Bensimhon Height (inches): 74 Interpreting Physician: Fransico Him MD, ABSM Weight (lbs): 230 RPSGT: Rosebud Poles BMI: 30 MRN: [id number] Neck Size: 17.50  CLINICAL INFORMATION Sleep Study Type: Split Night CPAP  Indication for sleep study: Snoring  Epworth Sleepiness Score: 17  SLEEP STUDY TECHNIQUE As per the AASM Manual for the Scoring of Sleep and Associated Events v2.3 (April 2016) with a hypopnea requiring 4% desaturations.  The channels recorded and monitored were frontal, central and occipital EEG, electrooculogram (EOG), submentalis EMG (chin), nasal and oral airflow, thoracic and abdominal wall motion, anterior tibialis EMG, snore microphone, electrocardiogram, and pulse oximetry. Continuous positive airway pressure (CPAP) was initiated when the patient met split night criteria and was titrated according to treat sleep-disordered breathing.  MEDICATIONS Medications self-administered by patient taken the night of the study : N/A  RESPIRATORY PARAMETERS Diagnostic Total AHI (/hr): 68.9  RDI (/hr):69.2  OA Index (/hr): 48.3  CA Index (/hr): 0.0 REM AHI (/hr): 65.9  NREM AHI (/hr):69.4  Supine AHI (/hr):66.9  Non-supine AHI (/hr):69.4 Min O2 Sat (%):74.0  Mean O2 (%): 87.3  Time below 88% (min):67.8   Titration Optimal Pressure (cm):12  AHI at Optimal Pressure (/hr):0.0  Min O2 at Optimal Pressure (%):95.0 Supine % at Optimal (%):100  Sleep % at Optimal (%):100   SLEEP ARCHITECTURE The recording time for the entire night was 392 minutes.  During a baseline period of 173.1 minutes, the patient slept for 160.3 minutes in REM and nonREM, yielding a sleep efficiency of 92.6%%. Sleep onset after lights out was 0.0 minutes with a REM latency of 100.3 minutes. The patient spent 5.2%% of the night in stage N1 sleep, 78.9%% in stage  N2 sleep, 0.0%% in stage N3 and 15.9% in REM.  During the titration period of 206.5 minutes, the patient slept for 174.7 minutes in REM and nonREM, yielding a sleep efficiency of 84.6%%. Sleep onset after CPAP initiation was 11.8 minutes with a REM latency of 34.5 minutes. The patient spent 6.9%% of the night in stage N1 sleep, 54.2%% in stage N2 sleep, 25.5%% in stage N3 and 13.5% in REM.  CARDIAC DATA The 2 lead EKG demonstrated sinus rhythm. The mean heart rate was 100.0 beats per minute. Other EKG findings include: None.  LEG MOVEMENT DATA The total Periodic Limb Movements of Sleep (PLMS) were 0. The PLMS index was 0.0 .  IMPRESSIONS - Severe obstructive sleep apnea occurred during the diagnostic portion of the study (AHI = 68.9/hour). An optimal PAP pressure was selected for this patient ( 12 cm of water) - No significant central sleep apnea occurred during the diagnostic portion of the study (CAI = 0.0/hour). - Severe oxygen desaturation was noted during the diagnostic portion of the study (Min O2 = 74.0%). - The patient snored with moderate snoring volume during the diagnostic portion of the study. - No cardiac abnormalities were noted during this study. - Clinically significant periodic limb movements did not occur during sleep.  DIAGNOSIS - Obstructive Sleep Apnea (327.23 [G47.33 ICD-10])  RECOMMENDATIONS - Trial of CPAP therapy on 12 cm H2O with a Medium size Fisher&Paykel Full Face Mask Simplus mask and heated humidification. - Avoid alcohol, sedatives and other CNS depressants that may worsen sleep apnea and disrupt normal sleep architecture. - Sleep hygiene should be reviewed to assess factors that may improve sleep quality. - Weight management and regular exercise should be initiated  or continued. - Return to Sleep Center for re-evaluation after 10 weeks of therapy  [Electronically signed] 05/15/2018 09:25 PM  Fransico Him MD, ABSM Diplomate, American Board of Sleep  Medicine

## 2018-05-16 ENCOUNTER — Telehealth: Payer: Self-pay | Admitting: *Deleted

## 2018-05-16 NOTE — Telephone Encounter (Signed)
-----   Message from Quintella Reichert, MD sent at 05/15/2018  9:30 PM EST ----- Please let patient know that they have significant sleep apnea and had successful PAP titration and will be set up with PAP unit.  Please let DME know that order is in EPIC.  Please set patient up for OV in 10 weeks

## 2018-05-16 NOTE — Telephone Encounter (Signed)
Called results no vm set up, texted a message to call back.

## 2018-05-18 NOTE — Telephone Encounter (Signed)
Informed patient of titration results and verbalized understanding was indicated. Patient understands his titration study showed they have significant sleep apnea and had successful PAP titration and will be set up with PAP unit.   Upon patient request DME selection is [facility]. Patient understands he will be contacted by [facility] to set up his cpap. Patient understands to call if [facility] does not contact him with new setup in a timely manner. Patient understands they will be called once confirmation has been received from [facility] that they have received their new machine to schedule 10 week follow up appointment.  [facility] notified of new cpap order  Please add to airview Patient was grateful for the call and thanked me.  Awaiting patient to bring his medicaid card to be scanned in our office.

## 2018-05-19 ENCOUNTER — Ambulatory Visit (HOSPITAL_COMMUNITY): Payer: Self-pay | Admitting: Pharmacist

## 2018-05-19 ENCOUNTER — Encounter (HOSPITAL_COMMUNITY)
Admission: RE | Admit: 2018-05-19 | Discharge: 2018-05-19 | Disposition: A | Discharge status: Home / Self Care | Payer: Medicaid Other | Source: Ambulatory Visit | Attending: Internal Medicine | Admitting: Internal Medicine

## 2018-05-19 DIAGNOSIS — Z95811 Presence of heart assist device: Secondary | ICD-10-CM

## 2018-05-19 LAB — PROTIME-INR
INR: 1.76
Prothrombin Time: 20.3 seconds — ABNORMAL HIGH (ref 11.4–15.2)

## 2018-05-22 ENCOUNTER — Other Ambulatory Visit (HOSPITAL_COMMUNITY): Payer: Self-pay

## 2018-05-22 NOTE — Telephone Encounter (Signed)
Patient can not find his medicaid card he is planning on calling for a new one so I can send his paperwork to the DME.

## 2018-05-23 NOTE — Telephone Encounter (Signed)
Insurance information was faxed over from [employer] Loser) verifying dates and eligibility.

## 2018-05-26 ENCOUNTER — Encounter (HOSPITAL_COMMUNITY)
Admission: RE | Admit: 2018-05-26 | Discharge: 2018-05-26 | Disposition: A | Discharge status: Home / Self Care | Payer: Medicaid Other | Source: Ambulatory Visit | Attending: Internal Medicine | Admitting: Internal Medicine

## 2018-05-26 ENCOUNTER — Ambulatory Visit (HOSPITAL_COMMUNITY): Payer: Self-pay | Admitting: Pharmacist

## 2018-05-26 DIAGNOSIS — Z95811 Presence of heart assist device: Secondary | ICD-10-CM

## 2018-05-26 LAB — PROTIME-INR
INR: 1.89
Prothrombin Time: 21.4 seconds — ABNORMAL HIGH (ref 11.4–15.2)

## 2018-06-02 ENCOUNTER — Other Ambulatory Visit (HOSPITAL_COMMUNITY): Payer: Self-pay | Admitting: *Deleted

## 2018-06-02 DIAGNOSIS — Z7901 Long term (current) use of anticoagulants: Secondary | ICD-10-CM

## 2018-06-02 DIAGNOSIS — Z95811 Presence of heart assist device: Secondary | ICD-10-CM

## 2018-06-02 DIAGNOSIS — I5043 Acute on chronic combined systolic (congestive) and diastolic (congestive) heart failure: Secondary | ICD-10-CM

## 2018-06-05 ENCOUNTER — Ambulatory Visit (HOSPITAL_COMMUNITY): Payer: Self-pay | Admitting: Pharmacist

## 2018-06-05 ENCOUNTER — Ambulatory Visit (HOSPITAL_COMMUNITY)
Admission: RE | Admit: 2018-06-05 | Discharge: 2018-06-05 | Disposition: A | Discharge status: Home / Self Care | Payer: Medicaid Other | Source: Ambulatory Visit | Attending: Internal Medicine | Admitting: Internal Medicine

## 2018-06-05 ENCOUNTER — Encounter (HOSPITAL_COMMUNITY): Payer: Self-pay

## 2018-06-05 ENCOUNTER — Encounter (HOSPITAL_COMMUNITY): Payer: Self-pay | Admitting: *Deleted

## 2018-06-05 VITALS — BP 107/64 | HR 86 | Wt 225.0 lb

## 2018-06-05 DIAGNOSIS — E876 Hypokalemia: Secondary | ICD-10-CM | POA: Diagnosis not present

## 2018-06-05 DIAGNOSIS — F17210 Nicotine dependence, cigarettes, uncomplicated: Secondary | ICD-10-CM | POA: Diagnosis not present

## 2018-06-05 DIAGNOSIS — I428 Other cardiomyopathies: Secondary | ICD-10-CM | POA: Diagnosis not present

## 2018-06-05 DIAGNOSIS — Z7982 Long term (current) use of aspirin: Secondary | ICD-10-CM | POA: Insufficient documentation

## 2018-06-05 DIAGNOSIS — Z95811 Presence of heart assist device: Secondary | ICD-10-CM | POA: Diagnosis not present

## 2018-06-05 DIAGNOSIS — G4733 Obstructive sleep apnea (adult) (pediatric): Secondary | ICD-10-CM | POA: Insufficient documentation

## 2018-06-05 DIAGNOSIS — Z720 Tobacco use: Secondary | ICD-10-CM | POA: Diagnosis not present

## 2018-06-05 DIAGNOSIS — I110 Hypertensive heart disease with heart failure: Secondary | ICD-10-CM | POA: Diagnosis not present

## 2018-06-05 DIAGNOSIS — Z79899 Other long term (current) drug therapy: Secondary | ICD-10-CM | POA: Diagnosis not present

## 2018-06-05 DIAGNOSIS — K219 Gastro-esophageal reflux disease without esophagitis: Secondary | ICD-10-CM | POA: Diagnosis not present

## 2018-06-05 DIAGNOSIS — I5043 Acute on chronic combined systolic (congestive) and diastolic (congestive) heart failure: Secondary | ICD-10-CM | POA: Diagnosis not present

## 2018-06-05 DIAGNOSIS — F419 Anxiety disorder, unspecified: Secondary | ICD-10-CM | POA: Diagnosis not present

## 2018-06-05 DIAGNOSIS — Z7901 Long term (current) use of anticoagulants: Secondary | ICD-10-CM

## 2018-06-05 DIAGNOSIS — I5022 Chronic systolic (congestive) heart failure: Secondary | ICD-10-CM | POA: Insufficient documentation

## 2018-06-05 DIAGNOSIS — I10 Essential (primary) hypertension: Secondary | ICD-10-CM

## 2018-06-05 LAB — CBC
HCT: 44.3 % (ref 39.0–52.0)
HEMOGLOBIN: 13.1 g/dL (ref 13.0–17.0)
MCH: 27.8 pg (ref 26.0–34.0)
MCHC: 29.6 g/dL — ABNORMAL LOW (ref 30.0–36.0)
MCV: 94.1 fL (ref 80.0–100.0)
Platelets: 151 10*3/uL (ref 150–400)
RBC: 4.71 MIL/uL (ref 4.22–5.81)
RDW: 15.6 % — ABNORMAL HIGH (ref 11.5–15.5)
WBC: 6.3 10*3/uL (ref 4.0–10.5)
nRBC: 0 % (ref 0.0–0.2)

## 2018-06-05 LAB — BASIC METABOLIC PANEL
Anion gap: 9 (ref 5–15)
BUN: 18 mg/dL (ref 6–20)
CO2: 23 mmol/L (ref 22–32)
Calcium: 9.1 mg/dL (ref 8.9–10.3)
Chloride: 107 mmol/L (ref 98–111)
Creatinine, Ser: 1.34 mg/dL — ABNORMAL HIGH (ref 0.61–1.24)
GFR calc Af Amer: >60 mL/min (ref >60)
GFR calc non Af Amer: >60 mL/min (ref >60)
Glucose, Bld: 94 mg/dL (ref 70–99)
Potassium: 4.4 mmol/L (ref 3.5–5.1)
Sodium: 139 mmol/L (ref 135–145)

## 2018-06-05 LAB — PROTIME-INR
INR: 2.05
Prothrombin Time: 22.9 seconds — ABNORMAL HIGH (ref 11.4–15.2)

## 2018-06-05 LAB — LACTATE DEHYDROGENASE: LDH: 283 U/L — ABNORMAL HIGH (ref 98–192)

## 2018-06-05 NOTE — Progress Notes (Signed)
Advanced Heart Failure VAD Clinic Note   Primary cardiologist: Harwani Primary HF: Dr. [name]  HPI:  Jonathan Wood is a 48 y.o. 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   Admitted 08/25/17 with ADHF and cardiogenic shock. Started on IV lasix and milrinone 0.125 mcg/kg/min with over 27 lbs of diuresis.   Echo showed EF 5-10%, with mild RV dilation, and mod to severe reduction in function.Case discussed with [facility] transplnt team but not candidate for transplant due to ongoing tobacco use. Underwent HM3 LVAD 09/06/17 without any immediate post-op complications. 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  Started on neurontin for pocket pain. Progressed will with PT and thought stable for home on discharge.  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 CT with soft-tissue stranding. No abscess. Improved with IV abx and finished po abx.   Follow up for Heart Failure/LVAD:  He presents today for regular follow up. Doing very well. Taking torsemide 20mg  every other day and keeping fluid down. Smoking 2 cigarettes a day but says he thinks he will be able to quit soon. Denies orthopnea or PND. No fevers, chills. Keeping driveline anchored well and site much improved. No bleeding, melena or neuro symptoms. No VAD alarms. Taking all meds as prescribed.    VAD Indication: Destination Therapy   VAD interrogation & Equipment Management: Speed: 5600 Flow: 4.8 Power: 4.3w    PI: 4.2 Hct: 37 Alarms: daily low voltage  Events: 5-20 daily  Fixed speed 5600 Low speed limit: 5300  Primary Controller:  Replace back up battery in 25 months. Back up controller:   Replace back up battery in 28 months.  Annual Equipment Maintenance on UBC/PM was performed on 4/19.   Past Medical History:  Diagnosis Date  . Asthma   . CHF (congestive heart failure) (HCC)    a. 09/2016: EF 20-25%  with cath showing normal cors  . GERD (gastroesophageal reflux disease)   . History of hiatal hernia     Current Outpatient Medications  Medication Sig Dispense Refill  . amiodarone (PACERONE) 200 MG tablet Take 1 tablet (200 mg total) by mouth daily. 30 tablet 6  . aspirin EC 81 MG tablet Take 1 tablet (81 mg total) by mouth daily. 30 tablet 1  . citalopram (CELEXA) 10 MG tablet TAKE 1 TABLET BY MOUTH ONCE DAILY 90 tablet 0  . docusate sodium (COLACE) 100 MG capsule Take 1 capsule (100 mg total) by mouth 2 (two) times daily. 10 capsule 2  . gabapentin (NEURONTIN) 300 MG capsule Take 1 capsule (300 mg total) by mouth 3 (three) times daily. 90 capsule 6  . pantoprazole (PROTONIX) 40 MG tablet Take 1 tablet (40 mg total) by mouth daily. 30 tablet 6  . sacubitril-valsartan (ENTRESTO) 49-51 MG Take 1 tablet by mouth 2 (two) times daily. 60 tablet 6  . sildenafil (REVATIO) 20 MG tablet Take 1 tablet (20 mg total) by mouth 3 (three) times daily. 90 tablet 3  . torsemide (DEMADEX) 20 MG tablet Take 20 mg by mouth as needed. Reports that he is taking every other day    . traZODone (DESYREL) 50 MG tablet Take 1 tablet (50 mg total) by mouth at bedtime. 30 tablet 4  . warfarin (COUMADIN) 5 MG tablet Take 5 mg by mouth daily. 5mg  daily except 7.5 mg (1 and 1/2 tablets) Wed and Fri    .  enoxaparin (LOVENOX) 40 MG/0.4ML injection Inject 0.4 mLs (40 mg total) into the skin every 12 (twelve) hours. (Patient not taking: Reported on 03/06/2018)    . potassium chloride SA (KLOR-CON M20) 20 MEQ tablet Take 1 tablet (20 mEq total) by mouth daily. (Patient not taking: Reported on 06/05/2018) 90 tablet 3  . traMADol (ULTRAM) 50 MG tablet Take 1 tablet (50 mg total) by mouth every 6 (six) hours as needed. (Patient not taking: Reported on 06/05/2018) 20 tablet 0   No current facility-administered medications for this encounter.    Patient has no known allergies.  Review of systems complete and found to be negative  unless listed in HPI.    Vitals:   06/05/18 1108 06/05/18 1109  BP: (!) 104/0 107/64  Pulse: 86   SpO2: 98%   Weight: 102.1 kg (225 lb)     Wt Readings from Last 3 Encounters:  06/05/18 102.1 kg (225 lb)  04/24/18 104.6 kg (230 lb 9.6 oz)  04/03/18 100.1 kg (220 lb 9.6 oz)      Vital Signs:  Doppler Pressure:  104 Automatc BP: 107/64 (86) HR:  86 SPO2: 98% on RA  Weight: 225.0  lbs w/o eqt Last weight: 230.6  lb   Physical Exam: General:  NAD.  HEENT: normal  Neck: supple. JVP not elevated.  Carotids 2+ bilat; no bruits. No lymphadenopathy or thryomegaly appreciated. Cor: LVAD hum.  Lungs: Clear. Abdomen: obese soft, nontender, non-distended. No hepatosplenomegaly. No bruits or masses. Good bowel sounds. Driveline site clean. Anchor in place.  Extremities: no cyanosis, clubbing, rash. Warm no edema  Neuro: alert & oriented x 3. No focal deficits. Moves all 4 without problem    ASSESSMENT AND PLAN:   1. Chronic systolic HF - EF 91% s/p HM-3 LVAD on 09/06/17 - Doing very well. NYHA I symptoms on VAD support  - Volume status stable on exam.  Continue torsemide 20 qoD. Can take extra as needed - Continue Entresto 49/51 mg BID.   - Have begun referral process to [facility] for transplant. He is still smoking daily. Pt aware he needs 6 months no smoking for transplant consideration. -Discussed pros/cons of staying with VAD vs going to transplant. He is still interested in Transplant and says he can quit smoking.  Reinforced fluid restriction to < 2 L daily, sodium restriction to less than 2000 mg daily, and the importance of daily weights.    2. HM3 LVAD 09/06/2017.  -VAD interrogated personally. Parameters stable.. - Driveline site improved with improved anchoring. - LDH 283 - INR goal 2.0-2.5. INR 2.05. Discussed dosing with PharmD personally. - Continue ASA  - Blood pressure well controlled. Continue current regimen.  3. H/o Driveline infection - Resolved. See  above.  4. Hypokalemia - K 4.4 today. Continue current regimen of K 20 meq daily.    5. Anxiety - Much improved on Celexa.  - No change to current plan.   - Ok to use trazadone for sleep  6. Essential HTN - Blood pressure well controlled. Continue current regimen.  7. OSA - recently sleep study with very severe OSA (AHI 69/hr) - having trouble with mask but will f/u with Dr. Mayford Knife - encouraged weight loss  Total time spent 35 minutes. Over half that time spent discussing above.    Arvilla Meres, MD  12:55 PM

## 2018-06-05 NOTE — Progress Notes (Signed)
Patient presents for 6 week follow up in VAD Clinic today. Reports no problems with VAD equipment or concerns with drive line.   He reports taking Torsamide every other day. He says when he does not take it he notices his abdomen "feels full of fluid." Urinates well with Torsamide.   He admits to smoking 2 cigarettes a day. Reports he has days where he doesn't smoke at all. Educated on smoking cessation.   Reports that he is waiting on his Medicaid card. Once he receives his card he will go to [facility] to pick up his CPAP machine.    Vital Signs:  Doppler Pressure:  104 Automatc BP: 107/64 (86) HR:  86 SPO2: 98% on RA  Weight: 225.0  lbs w/o eqt Last weight: 230.6  lb  VAD Indication: Destination Therapy   VAD interrogation & Equipment Management: Speed: 5600 Flow: 4.8 Power: 4.3w    PI: 4.2 Hct: 37 Alarms: daily low voltage  Events: 5-20 daily  Fixed speed 5600 Low speed limit: 5300  Primary Controller:  Replace back up battery in 25 months. Back up controller:   Replace back up battery in 28 months.  Annual Equipment Maintenance on UBC/PM was performed on 4/19.   I reviewed the LVAD parameters from today and compared the results to the patient's prior recorded data. LVAD interrogation was NEGATIVE for significant power changes, NEGATIVE for clinical alarms and STABLE for PI events/speed drops. No programming changes were made and pump is functioning within specified parameters. Pt is performing daily controller and system monitor self tests along with completing weekly and monthly maintenance for LVAD equipment.  LVAD equipment check completed and is in good working order. Back-up equipment present.   Exit Site Care: Drive line is being maintain twice weekly to weekly by patient and Christy.  Existing VAD dressing removed and site care performed using sterile technique. Drive line exit site cleaned with Chlora prep applicators x 2, allowed to dry, and  gauze dressing with silver strip re-applied. Drive line exit site unincorporated, the velour is fully implanted at exit site. No erythema, small amount yellow drainage on gauze dressing, no redness, tenderness, or foul odor. Stabilization device present and accurately applied- changed positioning to help with tissue ingrowth. Patient had drive line bent at a sharp angle. Discussed importance of drive line positioning for tissue ingrown and prevention of drive line fracture. He verbalized understanding.  Pt denies fever or chills. Pt given 10 daily dressing kits and 10 anchors.  Will have patient continue twice a week dressing changes.            Significant Events on VAD Support:   Device:none  BP & Labs:  Doppler 104 - reflecting modified systolic  Hgb 13.1 - No S/S of bleeding. Specifically denies melena/BRBPR or nosebleeds.  LDH 283 stable at  with established baseline of 200-300. Denies tea-colored urine. No power elevations noted on interrogation.   Patient Instructions:  1. No medication changes. 2. Continue twice a week dressing changes. 2. Return to VAD clinic in 2 months.   Alyce Pagan RN VAD Coordinator  Office: [phone]  24/7 Pager: [phone]

## 2018-06-14 ENCOUNTER — Encounter (HOSPITAL_COMMUNITY)
Admission: RE | Admit: 2018-06-14 | Discharge: 2018-06-14 | Disposition: A | Discharge status: Home / Self Care | Payer: Medicaid Other | Source: Ambulatory Visit | Attending: Internal Medicine | Admitting: Internal Medicine

## 2018-06-14 ENCOUNTER — Ambulatory Visit (HOSPITAL_COMMUNITY): Payer: Self-pay | Admitting: Pharmacist

## 2018-06-14 DIAGNOSIS — Z95811 Presence of heart assist device: Secondary | ICD-10-CM

## 2018-06-14 LAB — PROTIME-INR
INR: 2.44
Prothrombin Time: 26.2 seconds — ABNORMAL HIGH (ref 11.4–15.2)

## 2018-06-20 ENCOUNTER — Other Ambulatory Visit (HOSPITAL_COMMUNITY): Payer: Self-pay | Admitting: Unknown Physician Specialty

## 2018-06-20 DIAGNOSIS — Z95811 Presence of heart assist device: Secondary | ICD-10-CM

## 2018-06-20 DIAGNOSIS — Z7901 Long term (current) use of anticoagulants: Secondary | ICD-10-CM

## 2018-06-26 NOTE — Telephone Encounter (Addendum)
Patient has a 10 week follow up appointment scheduled for 09/07/18. Patient understands he needs to keep this appointment for insurance compliance. [facility] will contact patient with his compliance appt.

## 2018-06-27 ENCOUNTER — Other Ambulatory Visit (HOSPITAL_COMMUNITY)
Admission: RE | Admit: 2018-06-27 | Discharge: 2018-06-27 | Disposition: A | Discharge status: Home / Self Care | Payer: Medicaid Other | Source: Ambulatory Visit | Attending: Internal Medicine | Admitting: Internal Medicine

## 2018-06-27 ENCOUNTER — Ambulatory Visit (HOSPITAL_COMMUNITY): Payer: Self-pay | Admitting: Pharmacist

## 2018-06-27 ENCOUNTER — Other Ambulatory Visit (HOSPITAL_COMMUNITY): Payer: Self-pay | Admitting: Unknown Physician Specialty

## 2018-06-27 DIAGNOSIS — Z95811 Presence of heart assist device: Secondary | ICD-10-CM | POA: Diagnosis not present

## 2018-06-27 LAB — PROTIME-INR
INR: 2.05
Prothrombin Time: 22.9 seconds — ABNORMAL HIGH (ref 11.4–15.2)

## 2018-06-27 MED ORDER — ASPIRIN EC 81 MG PO TBEC: 81.0000 mg | DELAYED_RELEASE_TABLET | Freq: Every day | ORAL | 1 refills | Status: DC

## 2018-07-11 ENCOUNTER — Other Ambulatory Visit (HOSPITAL_COMMUNITY)
Admission: RE | Admit: 2018-07-11 | Discharge: 2018-07-11 | Disposition: A | Discharge status: Home / Self Care | Payer: Medicaid Other | Source: Ambulatory Visit | Attending: Internal Medicine | Admitting: Internal Medicine

## 2018-07-11 ENCOUNTER — Ambulatory Visit (HOSPITAL_COMMUNITY): Payer: Self-pay | Admitting: Pharmacist

## 2018-07-11 DIAGNOSIS — Z95811 Presence of heart assist device: Secondary | ICD-10-CM | POA: Diagnosis present

## 2018-07-11 LAB — PROTIME-INR
INR: 1.7 — ABNORMAL HIGH (ref 0.8–1.2)
Prothrombin Time: 20.2 seconds — ABNORMAL HIGH (ref 11.4–15.2)

## 2018-07-16 ENCOUNTER — Other Ambulatory Visit (HOSPITAL_COMMUNITY): Payer: Self-pay | Admitting: Student

## 2018-07-28 ENCOUNTER — Encounter (HOSPITAL_COMMUNITY)
Admission: RE | Admit: 2018-07-28 | Discharge: 2018-07-28 | Disposition: A | Discharge status: Home / Self Care | Payer: Medicaid Other | Source: Ambulatory Visit | Attending: Internal Medicine | Admitting: Internal Medicine

## 2018-07-28 ENCOUNTER — Other Ambulatory Visit: Payer: Self-pay

## 2018-07-28 ENCOUNTER — Ambulatory Visit (HOSPITAL_COMMUNITY): Payer: Self-pay | Admitting: Pharmacist

## 2018-07-28 DIAGNOSIS — Z95811 Presence of heart assist device: Secondary | ICD-10-CM | POA: Diagnosis present

## 2018-07-28 LAB — PROTIME-INR
INR: 3.9 — ABNORMAL HIGH (ref 0.8–1.2)
Prothrombin Time: 37.5 seconds — ABNORMAL HIGH (ref 11.4–15.2)

## 2018-08-01 ENCOUNTER — Other Ambulatory Visit (HOSPITAL_COMMUNITY): Payer: Self-pay | Admitting: *Deleted

## 2018-08-01 ENCOUNTER — Telehealth (HOSPITAL_COMMUNITY): Payer: Self-pay | Admitting: Licensed Clinical Social Worker

## 2018-08-01 DIAGNOSIS — I5043 Acute on chronic combined systolic (congestive) and diastolic (congestive) heart failure: Secondary | ICD-10-CM

## 2018-08-01 DIAGNOSIS — Z95811 Presence of heart assist device: Secondary | ICD-10-CM

## 2018-08-01 MED ORDER — TORSEMIDE 20 MG PO TABS: 20.0000 mg | ORAL_TABLET | ORAL | 3 refills | Status: DC | PRN

## 2018-08-01 NOTE — Telephone Encounter (Signed)
Message left for return call to review needs for CoVid 19 public health concerns and emergency VAD pager number. Jackie Alisha Burgo, LCSW, CCSW-MCS [phone] 

## 2018-08-02 ENCOUNTER — Ambulatory Visit (HOSPITAL_COMMUNITY): Payer: Self-pay | Admitting: Pharmacist

## 2018-08-02 ENCOUNTER — Other Ambulatory Visit (HOSPITAL_COMMUNITY)
Admission: RE | Admit: 2018-08-02 | Discharge: 2018-08-02 | Disposition: A | Discharge status: Home / Self Care | Payer: Medicaid Other | Source: Ambulatory Visit | Attending: Internal Medicine | Admitting: Internal Medicine

## 2018-08-02 ENCOUNTER — Other Ambulatory Visit: Payer: Self-pay

## 2018-08-02 ENCOUNTER — Telehealth (HOSPITAL_COMMUNITY): Payer: Self-pay | Admitting: Licensed Clinical Social Worker

## 2018-08-02 DIAGNOSIS — Z95811 Presence of heart assist device: Secondary | ICD-10-CM

## 2018-08-02 LAB — PROTIME-INR
INR: 1.4 — ABNORMAL HIGH (ref 0.8–1.2)
Prothrombin Time: 17 seconds — ABNORMAL HIGH (ref 11.4–15.2)

## 2018-08-02 NOTE — Telephone Encounter (Signed)
CSW contacted patient to assure food, medications and confirmation of VAD pager number if concerns or emergency arise. Patient instructed to stay home and use of proper hygiene and call if needed.  Patient informed that VAD team will call the day before any upcoming appointments with instructions due to current CoVid 19 outbreak. Patient verbalizes understanding and states need for dressing kits and low on finances for gas. CSW provided patient with a [employer] gift card to get gas for trip to [place] to pick up needed supplies. Patient grateful for assistance.CSW continues to follow for support and needs as they arise. Lasandra Beech, LCSW, CCSW-MCS [phone]

## 2018-08-09 ENCOUNTER — Other Ambulatory Visit: Payer: Self-pay | Admitting: Internal Medicine

## 2018-08-09 ENCOUNTER — Encounter (HOSPITAL_COMMUNITY)
Admission: RE | Admit: 2018-08-09 | Discharge: 2018-08-09 | Disposition: A | Discharge status: Home / Self Care | Payer: Medicaid Other | Source: Ambulatory Visit | Attending: Internal Medicine | Admitting: Internal Medicine

## 2018-08-09 DIAGNOSIS — Z95811 Presence of heart assist device: Secondary | ICD-10-CM | POA: Diagnosis not present

## 2018-08-09 LAB — PROTIME-INR
INR: 1.7 — AB (ref 0.8–1.2)
Prothrombin Time: 20.1 seconds — ABNORMAL HIGH (ref 11.4–15.2)

## 2018-08-10 ENCOUNTER — Ambulatory Visit (HOSPITAL_COMMUNITY): Payer: Self-pay | Admitting: Pharmacist

## 2018-08-10 ENCOUNTER — Telehealth (HOSPITAL_COMMUNITY): Payer: Self-pay | Admitting: Unknown Physician Specialty

## 2018-08-10 DIAGNOSIS — Z95811 Presence of heart assist device: Secondary | ICD-10-CM

## 2018-08-10 NOTE — Telephone Encounter (Signed)
Called pt at home to triage the need for clinic visit on Monday. Pt denies SOB, any edema, orthopnea or PND. Pt states that the his is taking Demadex 20 mg daily. Pt states that he wears his CPAP nightly and is able to sleep lying flat. Pt denies any alarms from his LVAD. Pt states that Alyse Low is changing his drive line dressing once a week using a weekly kit. Pt denies any drainage, redness, tenderness or odor at the site. Pt had a repeat INR yesterday of 1.7, pharmacy notified. Based on this information we will move pts clinic visit out 2-3 weeks based on availability to protect him from exposure to COVID-19.

## 2018-08-14 ENCOUNTER — Inpatient Hospital Stay (HOSPITAL_COMMUNITY): Admission: RE | Admit: 2018-08-14 | Payer: Medicaid Other | Source: Ambulatory Visit

## 2018-08-15 ENCOUNTER — Telehealth (HOSPITAL_COMMUNITY): Payer: Self-pay | Admitting: Licensed Clinical Social Worker

## 2018-08-15 NOTE — Telephone Encounter (Signed)
CSW contacted patient to inquire about food insecurity during this health crisis. Patient reported need and agreeable to weekly delivery from [employer]. Patient informed that delivery will be left on front door with no face to face contact with delivery person. Patient agreeable to plan and grateful for the assistance. Jackie Rashon Rezek, LCSW, CCSW-MCS [phone] 

## 2018-08-16 ENCOUNTER — Telehealth (HOSPITAL_COMMUNITY): Payer: Self-pay | Admitting: Licensed Clinical Social Worker

## 2018-08-16 NOTE — Telephone Encounter (Signed)
CSW contacted patient to follow up on weekly food delivery package. Patient informed of delivery time and no face to face contact during delivery. Patient verbalizes understanding and grateful for the assistance.  CSW continues to follow for supportive needs. Jackie Makayleigh Poliquin, LCSW, CCSW-MCS [phone]  

## 2018-08-17 ENCOUNTER — Encounter (HOSPITAL_COMMUNITY)
Admission: RE | Admit: 2018-08-17 | Discharge: 2018-08-17 | Disposition: A | Discharge status: Home / Self Care | Payer: Medicaid Other | Source: Ambulatory Visit | Attending: Internal Medicine | Admitting: Internal Medicine

## 2018-08-17 ENCOUNTER — Other Ambulatory Visit (HOSPITAL_COMMUNITY): Payer: Self-pay | Admitting: *Deleted

## 2018-08-17 ENCOUNTER — Telehealth (HOSPITAL_COMMUNITY): Payer: Self-pay | Admitting: Unknown Physician Specialty

## 2018-08-17 DIAGNOSIS — Z95811 Presence of heart assist device: Secondary | ICD-10-CM | POA: Insufficient documentation

## 2018-08-17 LAB — PROTIME-INR
INR: 1.8 — ABNORMAL HIGH (ref 0.8–1.2)
Prothrombin Time: 21 seconds — ABNORMAL HIGH (ref 11.4–15.2)

## 2018-08-17 MED ORDER — ZOLPIDEM TARTRATE 10 MG PO TABS: 10.0000 mg | ORAL_TABLET | Freq: Every evening | ORAL | 0 refills | Status: DC | PRN

## 2018-08-17 NOTE — Telephone Encounter (Signed)
Pt called VAD pager to report his local drug store did not get Rx for Ambien. Rx called to [facility] drug for 30 day supply of Ambien; # 30; no refills.   Called pt to let him know Rx had been called and pharmacy was preparing for him now.  Hessie Diener RN, VAD Coordinator [phone]

## 2018-08-17 NOTE — Telephone Encounter (Signed)
Pt called the office stating that he is still having difficulty sleeping. Pt states that he tried to the Trazodone 50 mg that we prescribed but it is not helping him sleep and he states that it is causing him to have nightmares. I have sent Ambien 10 mg to [facility] drug per Dr. [name].  Pt also states that it is has been 2 weeks since he has had a BM. Pt states that he is taking miralax every other day and dulcolax tablet daily. Pt instructed that he can take the Miralax twice daily as well as the dulcolax. Pt verbalized understanding of instructions given.   Carlton Adam RN, BSN VAD Coordinator 24/7 Pager [phone]

## 2018-08-18 ENCOUNTER — Ambulatory Visit (HOSPITAL_COMMUNITY): Payer: Self-pay | Admitting: Pharmacist

## 2018-08-18 DIAGNOSIS — Z95811 Presence of heart assist device: Secondary | ICD-10-CM

## 2018-08-21 ENCOUNTER — Emergency Department (HOSPITAL_COMMUNITY): Payer: Medicaid Other

## 2018-08-21 ENCOUNTER — Inpatient Hospital Stay (HOSPITAL_COMMUNITY): Payer: Medicaid Other

## 2018-08-21 ENCOUNTER — Inpatient Hospital Stay (HOSPITAL_COMMUNITY)
Admission: EM | Admit: 2018-08-21 | Discharge: 2018-08-28 | DRG: 291 | Disposition: A | Discharge status: Home / Self Care | Payer: Medicaid Other | Attending: Cardiology | Admitting: Cardiology

## 2018-08-21 ENCOUNTER — Telehealth (HOSPITAL_COMMUNITY): Payer: Self-pay | Admitting: Licensed Clinical Social Worker

## 2018-08-21 ENCOUNTER — Other Ambulatory Visit: Payer: Self-pay

## 2018-08-21 ENCOUNTER — Encounter (HOSPITAL_COMMUNITY): Payer: Self-pay | Admitting: Pharmacy Technician

## 2018-08-21 DIAGNOSIS — E669 Obesity, unspecified: Secondary | ICD-10-CM | POA: Diagnosis present

## 2018-08-21 DIAGNOSIS — K269 Duodenal ulcer, unspecified as acute or chronic, without hemorrhage or perforation: Secondary | ICD-10-CM | POA: Diagnosis not present

## 2018-08-21 DIAGNOSIS — I509 Heart failure, unspecified: Secondary | ICD-10-CM

## 2018-08-21 DIAGNOSIS — K5909 Other constipation: Secondary | ICD-10-CM | POA: Diagnosis present

## 2018-08-21 DIAGNOSIS — Z7982 Long term (current) use of aspirin: Secondary | ICD-10-CM

## 2018-08-21 DIAGNOSIS — D649 Anemia, unspecified: Secondary | ICD-10-CM

## 2018-08-21 DIAGNOSIS — D509 Iron deficiency anemia, unspecified: Secondary | ICD-10-CM | POA: Diagnosis present

## 2018-08-21 DIAGNOSIS — G4700 Insomnia, unspecified: Secondary | ICD-10-CM | POA: Diagnosis present

## 2018-08-21 DIAGNOSIS — K219 Gastro-esophageal reflux disease without esophagitis: Secondary | ICD-10-CM | POA: Diagnosis present

## 2018-08-21 DIAGNOSIS — K649 Unspecified hemorrhoids: Secondary | ICD-10-CM | POA: Diagnosis not present

## 2018-08-21 DIAGNOSIS — K259 Gastric ulcer, unspecified as acute or chronic, without hemorrhage or perforation: Secondary | ICD-10-CM | POA: Diagnosis not present

## 2018-08-21 DIAGNOSIS — Z95811 Presence of heart assist device: Secondary | ICD-10-CM

## 2018-08-21 DIAGNOSIS — Z79899 Other long term (current) drug therapy: Secondary | ICD-10-CM

## 2018-08-21 DIAGNOSIS — K264 Chronic or unspecified duodenal ulcer with hemorrhage: Secondary | ICD-10-CM | POA: Diagnosis present

## 2018-08-21 DIAGNOSIS — J45909 Unspecified asthma, uncomplicated: Secondary | ICD-10-CM | POA: Diagnosis present

## 2018-08-21 DIAGNOSIS — Z8673 Personal history of transient ischemic attack (TIA), and cerebral infarction without residual deficits: Secondary | ICD-10-CM

## 2018-08-21 DIAGNOSIS — I5043 Acute on chronic combined systolic (congestive) and diastolic (congestive) heart failure: Secondary | ICD-10-CM

## 2018-08-21 DIAGNOSIS — E785 Hyperlipidemia, unspecified: Secondary | ICD-10-CM | POA: Diagnosis present

## 2018-08-21 DIAGNOSIS — I5023 Acute on chronic systolic (congestive) heart failure: Secondary | ICD-10-CM | POA: Diagnosis present

## 2018-08-21 DIAGNOSIS — K228 Other specified diseases of esophagus: Secondary | ICD-10-CM | POA: Diagnosis not present

## 2018-08-21 DIAGNOSIS — K625 Hemorrhage of anus and rectum: Secondary | ICD-10-CM | POA: Diagnosis not present

## 2018-08-21 DIAGNOSIS — F17210 Nicotine dependence, cigarettes, uncomplicated: Secondary | ICD-10-CM | POA: Diagnosis present

## 2018-08-21 DIAGNOSIS — I5021 Acute systolic (congestive) heart failure: Secondary | ICD-10-CM | POA: Diagnosis present

## 2018-08-21 DIAGNOSIS — N179 Acute kidney failure, unspecified: Secondary | ICD-10-CM | POA: Diagnosis present

## 2018-08-21 DIAGNOSIS — I428 Other cardiomyopathies: Secondary | ICD-10-CM | POA: Diagnosis present

## 2018-08-21 DIAGNOSIS — K635 Polyp of colon: Secondary | ICD-10-CM | POA: Diagnosis not present

## 2018-08-21 DIAGNOSIS — K922 Gastrointestinal hemorrhage, unspecified: Secondary | ICD-10-CM | POA: Diagnosis not present

## 2018-08-21 DIAGNOSIS — F419 Anxiety disorder, unspecified: Secondary | ICD-10-CM | POA: Diagnosis present

## 2018-08-21 DIAGNOSIS — G4733 Obstructive sleep apnea (adult) (pediatric): Secondary | ICD-10-CM | POA: Diagnosis present

## 2018-08-21 DIAGNOSIS — G92 Toxic encephalopathy: Secondary | ICD-10-CM | POA: Diagnosis present

## 2018-08-21 DIAGNOSIS — T426X5A Adverse effect of other antiepileptic and sedative-hypnotic drugs, initial encounter: Secondary | ICD-10-CM | POA: Diagnosis present

## 2018-08-21 DIAGNOSIS — Z7901 Long term (current) use of anticoagulants: Secondary | ICD-10-CM | POA: Diagnosis not present

## 2018-08-21 DIAGNOSIS — D62 Acute posthemorrhagic anemia: Secondary | ICD-10-CM | POA: Diagnosis present

## 2018-08-21 DIAGNOSIS — I110 Hypertensive heart disease with heart failure: Principal | ICD-10-CM | POA: Diagnosis present

## 2018-08-21 DIAGNOSIS — J189 Pneumonia, unspecified organism: Secondary | ICD-10-CM

## 2018-08-21 DIAGNOSIS — K921 Melena: Secondary | ICD-10-CM | POA: Diagnosis not present

## 2018-08-21 DIAGNOSIS — K640 First degree hemorrhoids: Secondary | ICD-10-CM | POA: Diagnosis present

## 2018-08-21 DIAGNOSIS — R4781 Slurred speech: Secondary | ICD-10-CM | POA: Diagnosis present

## 2018-08-21 DIAGNOSIS — K644 Residual hemorrhoidal skin tags: Secondary | ICD-10-CM | POA: Diagnosis present

## 2018-08-21 DIAGNOSIS — K449 Diaphragmatic hernia without obstruction or gangrene: Secondary | ICD-10-CM | POA: Diagnosis present

## 2018-08-21 LAB — CBC
HCT: 23.3 % — ABNORMAL LOW (ref 39.0–52.0)
HCT: 24.2 % — ABNORMAL LOW (ref 39.0–52.0)
Hemoglobin: 6.4 g/dL — CL (ref 13.0–17.0)
Hemoglobin: 7.4 g/dL — ABNORMAL LOW (ref 13.0–17.0)
MCH: 23 pg — ABNORMAL LOW (ref 26.0–34.0)
MCH: 25.1 pg — ABNORMAL LOW (ref 26.0–34.0)
MCHC: 27.5 g/dL — ABNORMAL LOW (ref 30.0–36.0)
MCHC: 30.6 g/dL (ref 30.0–36.0)
MCV: 83.8 fL (ref 80.0–100.0)
MCV: 82 fL (ref 80.0–100.0)
Platelets: 191 10*3/uL (ref 150–400)
Platelets: 171 10*3/uL (ref 150–400)
RBC: 2.78 MIL/uL — ABNORMAL LOW (ref 4.22–5.81)
RBC: 2.95 MIL/uL — ABNORMAL LOW (ref 4.22–5.81)
RDW: 18.7 % — ABNORMAL HIGH (ref 11.5–15.5)
RDW: 17.9 % — ABNORMAL HIGH (ref 11.5–15.5)
WBC: 10.2 10*3/uL (ref 4.0–10.5)
WBC: 8.3 10*3/uL (ref 4.0–10.5)
nRBC: 0.4 % — ABNORMAL HIGH (ref 0.0–0.2)
nRBC: 0.5 % — ABNORMAL HIGH (ref 0.0–0.2)

## 2018-08-21 LAB — COMPREHENSIVE METABOLIC PANEL
ALT: 25 U/L (ref 0–44)
AST: 40 U/L (ref 15–41)
Albumin: 3.9 g/dL (ref 3.5–5.0)
Alkaline Phosphatase: 70 U/L (ref 38–126)
Anion gap: 12 (ref 5–15)
BUN: 25 mg/dL — ABNORMAL HIGH (ref 6–20)
CO2: 21 mmol/L — ABNORMAL LOW (ref 22–32)
Calcium: 9.1 mg/dL (ref 8.9–10.3)
Chloride: 102 mmol/L (ref 98–111)
Creatinine, Ser: 1.78 mg/dL — ABNORMAL HIGH (ref 0.61–1.24)
GFR calc Af Amer: 52 mL/min — ABNORMAL LOW (ref >60)
GFR calc non Af Amer: 45 mL/min — ABNORMAL LOW (ref >60)
Glucose, Bld: 139 mg/dL — ABNORMAL HIGH (ref 70–99)
Potassium: 4.4 mmol/L (ref 3.5–5.1)
Sodium: 135 mmol/L (ref 135–145)
Total Bilirubin: 1 mg/dL (ref 0.3–1.2)
Total Protein: 8 g/dL (ref 6.5–8.1)

## 2018-08-21 LAB — LACTATE DEHYDROGENASE: LDH: 360 U/L — ABNORMAL HIGH (ref 98–192)

## 2018-08-21 LAB — MRSA PCR SCREENING: MRSA by PCR: NEGATIVE

## 2018-08-21 LAB — OCCULT BLOOD X 1 CARD TO LAB, STOOL: Fecal Occult Bld: POSITIVE — AB

## 2018-08-21 LAB — PREPARE RBC (CROSSMATCH)

## 2018-08-21 LAB — PROTIME-INR
INR: 2 — ABNORMAL HIGH (ref 0.8–1.2)
Prothrombin Time: 22.4 seconds — ABNORMAL HIGH (ref 11.4–15.2)

## 2018-08-21 MED ORDER — WARFARIN - PHARMACIST DOSING INPATIENT: Freq: Every day | Status: DC

## 2018-08-21 MED ORDER — DOCUSATE SODIUM 100 MG PO CAPS
100.0000 mg | ORAL_CAPSULE | Freq: Two times a day (BID) | ORAL | Status: DC
  Administered 2018-08-21 – 2018-08-25 (×6): 100 mg via ORAL
  Filled 2018-08-21 (×10): qty 1

## 2018-08-21 MED ORDER — ONDANSETRON HCL 4 MG/2ML IJ SOLN
4.0000 mg | Freq: Four times a day (QID) | INTRAMUSCULAR | Status: DC | PRN
  Administered 2018-08-23: 14:00:00 4 mg via INTRAVENOUS

## 2018-08-21 MED ORDER — CITALOPRAM HYDROBROMIDE 20 MG PO TABS
10.0000 mg | ORAL_TABLET | Freq: Every day | ORAL | Status: DC
  Administered 2018-08-22 – 2018-08-28 (×7): 10 mg via ORAL
  Filled 2018-08-21 (×8): qty 1

## 2018-08-21 MED ORDER — AMIODARONE HCL 200 MG PO TABS
200.0000 mg | ORAL_TABLET | Freq: Every day | ORAL | Status: DC
  Administered 2018-08-22 – 2018-08-28 (×7): 200 mg via ORAL
  Filled 2018-08-21 (×7): qty 1

## 2018-08-21 MED ORDER — FUROSEMIDE 10 MG/ML IJ SOLN: 40.0000 mg | Freq: Once | INTRAMUSCULAR | Status: DC

## 2018-08-21 MED ORDER — FUROSEMIDE 10 MG/ML IJ SOLN
80.0000 mg | Freq: Once | INTRAMUSCULAR | Status: AC
  Administered 2018-08-21: 80 mg via INTRAVENOUS
  Filled 2018-08-21: qty 8

## 2018-08-21 MED ORDER — SENNA 8.6 MG PO TABS
2.0000 | ORAL_TABLET | Freq: Every day | ORAL | Status: DC
  Administered 2018-08-21 – 2018-08-25 (×3): 17.2 mg via ORAL
  Filled 2018-08-21 (×7): qty 2

## 2018-08-21 MED ORDER — WARFARIN SODIUM 7.5 MG PO TABS
7.5000 mg | ORAL_TABLET | Freq: Once | ORAL | Status: DC
  Filled 2018-08-21: qty 1

## 2018-08-21 MED ORDER — SILDENAFIL CITRATE 20 MG PO TABS
20.0000 mg | ORAL_TABLET | Freq: Three times a day (TID) | ORAL | Status: DC
  Administered 2018-08-21 – 2018-08-28 (×21): 20 mg via ORAL
  Filled 2018-08-21 (×21): qty 1

## 2018-08-21 MED ORDER — ALBUTEROL SULFATE HFA 108 (90 BASE) MCG/ACT IN AERS
2.0000 | INHALATION_SPRAY | Freq: Once | RESPIRATORY_TRACT | Status: AC
  Administered 2018-08-21: 2 via RESPIRATORY_TRACT
  Filled 2018-08-21: qty 6.7

## 2018-08-21 MED ORDER — ACETAMINOPHEN 325 MG PO TABS: 650.0000 mg | ORAL_TABLET | ORAL | Status: DC | PRN

## 2018-08-21 MED ORDER — GABAPENTIN 300 MG PO CAPS
300.0000 mg | ORAL_CAPSULE | Freq: Three times a day (TID) | ORAL | Status: DC
  Administered 2018-08-21 – 2018-08-28 (×20): 300 mg via ORAL
  Filled 2018-08-21 (×20): qty 1

## 2018-08-21 MED ORDER — SODIUM CHLORIDE 0.9 % IV SOLN: 10.0000 mL/h | Freq: Once | INTRAVENOUS | Status: DC

## 2018-08-21 MED ORDER — PANTOPRAZOLE SODIUM 40 MG PO TBEC
40.0000 mg | DELAYED_RELEASE_TABLET | Freq: Every day | ORAL | Status: DC
  Administered 2018-08-22 – 2018-08-23 (×2): 40 mg via ORAL
  Filled 2018-08-21 (×2): qty 1

## 2018-08-21 MED ORDER — SACUBITRIL-VALSARTAN 49-51 MG PO TABS: 1.0000 | ORAL_TABLET | Freq: Two times a day (BID) | ORAL | Status: DC

## 2018-08-21 NOTE — Progress Notes (Signed)
ANTICOAGULATION CONSULT NOTE  Pharmacy Consult for warfarin Indication: VAD   Assessment: 47 yom with LVAD on warfarin PTA presenting with SOB, AMS. Pharmacy consulted to dose warfarin inpatient. INR therapeutic at 2.0 on admit. Hg low at 6.4 on admit - planning to transfuse PRBC, plt wnl, LDH 360. No active bleed issues documented. Noted continuing on amiodarone PTA dose.  PTA warfarin dose: 5mg  daily except 7.5mg  on MWF (last dose 08/20/18 PTA per RN discussion with patient)  Goal of Therapy:  INR 2-2.5 Monitor platelets by anticoagulation protocol: Yes   Plan:  Warfarin 7.5mg  PO x 1 dose - continue home dose for now Daily INR Monitor CBC, s/sx bleeding  Babs Bertin, PharmD, BCPS Clinical Pharmacist 08/21/2018 1:04 PM

## 2018-08-21 NOTE — ED Notes (Signed)
ED TO INPATIENT HANDOFF REPORT  ED Nurse Name and Phone #: Leatha Gilding [phone]  S Name/Age/Gender Jonathan Wood 48 y.o. male Room/Bed: 019C/019C  Code Status   Code Status: Partial Code  Home/SNF/Other Home Patient oriented to: self, place, time and situation Is this baseline? Yes   Triage Complete: Triage complete  Chief Complaint lvad  Triage Note Pt arrives via ems from home with SOB. Ems reports initially pt with RR of 40. Pt is LVAD pt. audible wheezing upon arrival. Pt took 2 ambien last night and did not sleep. Appears anxious. 98% RA.    Allergies No Known Allergies  Level of Care/Admitting Diagnosis ED Disposition    ED Disposition Condition Comment   Admit  Hospital Area: [facility] [[id number]]  Level of Care: ICU [6]  Diagnosis: Acute systolic (congestive) heart failure [facility]) [[id number]]  Admitting Physician: Bradly Bienenstock  Attending Physician: Laurey Morale [phone]  Estimated length of stay: past midnight tomorrow  Certification:: I certify this patient will need inpatient services for at least 2 midnights  Bed request comments: 2H. Has LVAD  PT Class (Do Not Modify): Inpatient [101]  PT Acc Code (Do Not Modify): Private [1]       B Medical/Surgery History Past Medical History:  Diagnosis Date  . Asthma   . CHF (congestive heart failure) (HCC)    a. 09/2016: EF 20-25% with cath showing normal cors  . GERD (gastroesophageal reflux disease)   . History of hiatal hernia    Past Surgical History:  Procedure Laterality Date  . IABP INSERTION N/A 09/05/2017   Procedure: IABP INSERTION;  Surgeon: Dolores Patty, MD;  Location: [facility] INVASIVE CV LAB;  Service: Cardiovascular;  Laterality: N/A;  . INSERTION OF IMPLANTABLE LEFT VENTRICULAR ASSIST DEVICE N/A 09/06/2017   Procedure: INSERTION OF IMPLANTABLE LEFT VENTRICULAR ASSIST DEVICE - HM3;  Surgeon: Kerin Perna, MD;  Location: [facility] OR;  Service: Open Heart Surgery;   Laterality: N/A;  HM3  . NASAL FRACTURE SURGERY  1987  . RIGHT HEART CATH N/A 08/30/2017   Procedure: RIGHT HEART CATH;  Surgeon: Dolores Patty, MD;  Location: [facility] INVASIVE CV LAB;  Service: Cardiovascular;  Laterality: N/A;  . RIGHT/LEFT HEART CATH AND CORONARY ANGIOGRAPHY N/A 10/08/2016   Procedure: Right/Left Heart Cath and Coronary Angiography;  Surgeon: Orpah Cobb, MD;  Location: [facility] INVASIVE CV LAB;  Service: Cardiovascular;  Laterality: N/A;  . TEE WITHOUT CARDIOVERSION N/A 09/06/2017   Procedure: TRANSESOPHAGEAL ECHOCARDIOGRAM (TEE);  Surgeon: Donata Clay, Theron Arista, MD;  Location: [facility] OR;  Service: Open Heart Surgery;  Laterality: N/A;     A IV Location/Drains/Wounds Patient Lines/Drains/Airways Status   Active Line/Drains/Airways    Name:   Placement date:   Placement time:   Site:   Days:   Incision (Closed) 09/06/17 Chest Other (Comment)   09/06/17    0747     349   Incision (Closed) 09/06/17 Abdomen Left;Lower   09/06/17    1400     349          Intake/Output Last 24 hours No intake or output data in the 24 hours ending 08/21/18 1313  Labs/Imaging Results for orders placed or performed during the hospital encounter of 08/21/18 (from the past 48 hour(s))  Comprehensive metabolic panel     Status: Abnormal   Collection Time: 08/21/18 12:10 PM  Result Value Ref Range   Sodium 135 135 - 145 mmol/L   Potassium 4.4 3.5 - 5.1 mmol/L  Chloride 102 98 - 111 mmol/L   CO2 21 (L) 22 - 32 mmol/L   Glucose, Bld 139 (H) 70 - 99 mg/dL   BUN 25 (H) 6 - 20 mg/dL   Creatinine, Ser 0.88 (H) 0.61 - 1.24 mg/dL   Calcium 9.1 8.9 - 11.0 mg/dL   Total Protein 8.0 6.5 - 8.1 g/dL   Albumin 3.9 3.5 - 5.0 g/dL   AST 40 15 - 41 U/L   ALT 25 0 - 44 U/L   Alkaline Phosphatase 70 38 - 126 U/L   Total Bilirubin 1.0 0.3 - 1.2 mg/dL   GFR calc non Af Amer 45 (L) >60 mL/min   GFR calc Af Amer 52 (L) >60 mL/min   Anion gap 12 5 - 15    Comment: Performed at [facility], [address], [place], [place] [address]  CBC     Status: Abnormal   Collection Time: 08/21/18 12:10 PM  Result Value Ref Range   WBC 10.2 4.0 - 10.5 K/uL   RBC 2.78 (L) 4.22 - 5.81 MIL/uL   Hemoglobin 6.4 (LL) 13.0 - 17.0 g/dL    Comment: REPEATED TO VERIFY THIS CRITICAL RESULT HAS VERIFIED AND BEEN CALLED TO M Vanda Waskey RN BY ALLISON BENNETT ON 04 06 2020 AT 1230, AND HAS BEEN READ BACK.     HCT 23.3 (L) 39.0 - 52.0 %   MCV 83.8 80.0 - 100.0 fL   MCH 23.0 (L) 26.0 - 34.0 pg   MCHC 27.5 (L) 30.0 - 36.0 g/dL   RDW 58.5 (H) 92.9 - 24.4 %   Platelets 191 150 - 400 K/uL   nRBC 0.4 (H) 0.0 - 0.2 %    Comment: Performed at [facility], [address] [place], [place] [address]  Lactate dehydrogenase     Status: Abnormal   Collection Time: 08/21/18 12:10 PM  Result Value Ref Range   LDH 360 (H) 98 - 192 U/L    Comment: Performed at [facility], [address] [place], [place] [address]  Protime-INR     Status: Abnormal   Collection Time: 08/21/18 12:10 PM  Result Value Ref Range   Prothrombin Time 22.4 (H) 11.4 - 15.2 seconds   INR 2.0 (H) 0.8 - 1.2    Comment: (NOTE) INR goal varies based on device and disease states. Performed at [facility], [address] [place], [place] [address]    Dg Chest Portable 1 View  Result Date: 08/21/2018 CLINICAL DATA:  Severe shortness of breath today.  LVAD. EXAM: PORTABLE CHEST 1 VIEW COMPARISON:  09/18/2017 FINDINGS: There is chronic cardiomegaly with LVAD in place. There is slight new accentuation of the interstitial markings, probably representing mild interstitial edema. Pulmonary vascularity is within normal limits. No discrete effusions or consolidative infiltrates. No acute bone abnormality. IMPRESSION: Slight bilateral pulmonary edema consistent with congestive heart failure. Chronic cardiomegaly. LVAD. Electronically Signed   By: Francene Boyers M.D.   On: 08/21/2018 12:23    Pending Labs Unresulted Labs (From admission, onward)     Start     Ordered   08/22/18 0500  Lactate dehydrogenase  Daily,   R     08/21/18 1301   08/22/18 0500  Protime-INR  Daily,   R     08/21/18 1301   08/22/18 0500  CBC  Daily,   R     08/21/18 1301   08/22/18 0500  Basic metabolic panel  Daily,  R     08/21/18 1301   08/21/18 1251  Prepare RBC  (Adult Blood Administration - PRBC)  Once,   R    Question Answer Comment  # of Units 2 units   Transfusion Indications Symptomatic Anemia   If emergent release call blood bank Not emergent release      08/21/18 1250   08/21/18 1250  [facility]  Once,   STAT    Comments:  [facility]    08/21/18 1250          Vitals/Pain Today's Vitals   08/21/18 1205 08/21/18 1207  BP:  111/71  Pulse:  (!) 105  Resp:  (!) 34  Temp:  98.4 F (36.9 C)  TempSrc:  Oral  SpO2:  94%  Weight: 108 kg   Height: 6\' 3"  (1.905 m)   PainSc: 0-No pain     Isolation Precautions No active isolations  Medications Medications  0.9 %  sodium chloride infusion (has no administration in time range)  amiodarone (PACERONE) tablet 200 mg (has no administration in time range)  sacubitril-valsartan (ENTRESTO) 49-51 mg per tablet (has no administration in time range)  sildenafil (REVATIO) tablet 20 mg (has no administration in time range)  citalopram (CELEXA) tablet 10 mg (has no administration in time range)  docusate sodium (COLACE) capsule 100 mg (has no administration in time range)  pantoprazole (PROTONIX) EC tablet 40 mg (has no administration in time range)  gabapentin (NEURONTIN) capsule 300 mg (has no administration in time range)  acetaminophen (TYLENOL) tablet 650 mg (has no administration in time range)  ondansetron (ZOFRAN) injection 4 mg (has no administration in time range)  albuterol (PROVENTIL HFA;VENTOLIN HFA) 108 (90 Base) MCG/ACT inhaler 2 puff (2 puffs Inhalation Given 08/21/18 1225)  furosemide (LASIX) injection 80 mg (80 mg Intravenous Given  08/21/18 1303)    Mobility walks Low fall risk   Focused Assessments Pulmonary Assessment Handoff:  Lung sounds: Bilateral Breath Sounds: Diminished, Rhonchi L Breath Sounds: Diminished, Rhonchi R Breath Sounds: Diminished, Rhonchi O2 Device: Room Air        R Recommendations: See Admitting Provider Note  Report given to:   Additional Notes:

## 2018-08-21 NOTE — Progress Notes (Addendum)
LVAD Coordinator Rounding Note:  Admitted 08/21/2018 per Dr. Shirlee Latch due to shortness of breath, tachypnea, and altered mental status.   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 Maren Beach under Destination Therapy criteria.  Received page from patient this morning after he was already in ambulance. He was yelling into the phone "I don't feel good" and then began to speak incoherently. Per EMS patient called EMS this morning c/o SOB. In ER patient reports that he has been unable to sleep for 4 days. Patient took 20mg  Ambien last night for insomnia stating "I took one and it didn't help so I took another." Speech intermittently garbled and incoherent. Falling asleep mid sentence. Snoring breath sounds when asleep. At times confused, attempting to get off stretcher and pulling off equipment. Reorients easily. Reports bowel movement over weekend with increase in bowel regimen. Abdomen distended; this is typically where he holds fluid. Facial edema also noted. Increased work of breathing with high respiratory rate. Chest xray shows mild pulmonary edema- 80mg  IV lasix received in ER. Patient reports he took extra Torsemide yesterday and "peed all day and night."   Hgb 6.4- reports blood when wiping "some days more than others." Has previously reported small amount of blood with wiping when straining to have bowel movement. He reports increase and frequency and amount of blood over the last 2 weeks. GI consult per Dr. Shirlee Latch. 2 units of blood ordered.   Updated patient's mother on patient status, plan of care, room number (4W96,) and unit phone number.    Vital signs: Temp: 97.6 HR: 113 Doppler Pressure: 110 Automatic BP: 110/95 (101) O2 Sat: 96% RA respiratory rate in the 30s-40s Wt:  238> lbs   LVAD interrogation reveals:  Speed: 5600 Flow: 4.3 Power:  4.2w PI: 4.0 Alarms: NO EXTERNAL POWER 4/5 at 0114. Per patient, accidentally double disconnected. SEVERAL NO EXTERNAL POWER and LOW VOLTAGE 4/4  early morning. Per patient he has been sleeping on batteries, and did not hear pump alarming. Discussed the importance of sleeping on wall power to avoid pump stop due to battery run down. Low voltage alarms daily due to battery run down.  Events: none Hematocrit: 33 Fixed speed: 5600 Low speed limit: 5300  Drive Line: Patient reports he is changing his own dressing twice a week. Dressing covering site, but incorrectly applied. Drive line pulled at severe upward angle across abdomen into anchor. Drive line dressing removed using sterile technique. Site cleansed with Chloraprep applicators x 2, allowed to dry. Scant dark drainage noted on ribbon and dressing, dark crust noted around exit site. No odor, redness, or rash noted. Velour fully implanted at site. Continue twice weekly dressing changes with silver strip. Anchor reapplied and drive line repositioned to promote ingrowth. Bedside nurse may perform dressing changes. Next dressing change due 08/24/2018.          Labs:  LDH trend: 360>  INR trend: 2.0>  Creatinine: 1.78>  Anticoagulation Plan: -INR Goal: 2.0 - 2.5 -ASA Dose: 81 mg daily   Device: none  Adverse Events on VAD: -  Plan/Recommendations:  1. Call VAD pager if any issues/questions with VAD equipment or drive line.  2. Twice a week dressing changes per bedside RN. Dressing kits and silver strips at bedside.  3. All 4 batteries at bedside belong to patient. He did not remember to bring his black back up bag, but did bring 2 extra batteries. Emergency equipment provided and at bedside.   Alyce Pagan RN VAD Coordinator  Office:  [phone]  24/7 Pager: [phone]

## 2018-08-21 NOTE — H&P (Signed)
Advanced Heart Failure VAD History and Physical Note   PCP-Cardiologist: Arvilla Meres, MD   Reason for Admission: A/C systolic HF  HPI:    Jonathan Wood is a 48 y.o. 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   Admitted 08/25/17 with ADHF and cardiogenic shock. Started on IV lasix and milrinone 0.125 mcg/kg/min with over 27 lbs of diuresis. Echo showed EF 5-10%, with mild RV dilation, and mod to severe reduction in function.Case discussed with [facility] transplnt team but not candidate for transplant due to ongoing tobacco use. Underwent HM3 LVAD 09/06/17 without any immediate post-op complications. 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 Started on neurontin for pocket pain. Progressed well with PT and thought stable for home on discharge.  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 CT with soft-tissue stranding. No abscess. Improved with IV abx and finished po abx.   Last seen in HF clinic 06/05/18. He was doing well on torsemide 20 mg every other day. He was still smoking daily and was encouraged to stop so that he could start transplant work up at [facility].    He had paged VAD coordinator last week with complaints of insomnia. Trazodone was giving him nightmares. He also reported it had been 2 weeks since last BM. Prescription called in for ambien by Dr [name]. He was instructed to increase miralax to BID with daily dulcolax.   He presented by EMS this morning with SOB and tachypnea. Sats stable 97% on RA. He is afebrile, but tachypneic with RR 30s. He took 20 mg ambien last night. Per VAD coordinator, he is tachypneic and following asleep while she speaks to him. His voice is slurred. Abdomen is distended, which is where he normally holds onto volume. He says that for 2 wks, he has noted either red blood in his stool or dark tarry stool. He has been more short of breath  than his baseline for about two weeks. CXR with mild pulmonary edema.  No fever/chills.  In the ER, he got Lasix 80 mg IV x 1 and is starting to diurese.   When seen in the ICU, he was more clear (alert/oriented) and not slurring his speech. Respiratory rate was slowing.   Pertinent admission labs include: Na 135, K 4.4, creatinine 1.78, LDH 360 (prior 220-280), WBC 10.2, Hgb 6.4, INR 2.0.  Type and screen pending. 2u PRBC ordered.   LVAD INTERROGATION:  HeartMate II LVAD:  Flow 4.6 liters/min, speed 5600, power 4.2, PI 3.7.    Review of systems complete and found to be negative unless listed in HPI.   Home Medications Prior to Admission medications   Medication Sig Start Date End Date Taking? Authorizing Provider  amiodarone (PACERONE) 200 MG tablet Take 1 tablet (200 mg total) by mouth daily. 04/20/18   Bensimhon, Bevelyn Buckles, MD  ASPIRIN LOW DOSE 81 MG EC tablet TAKE 1 TABLET BY MOUTH EVERY DAY 08/09/18   Bensimhon, Bevelyn Buckles, MD  citalopram (CELEXA) 10 MG tablet TAKE 1 TABLET BY MOUTH ONCE DAILY 04/20/18   Bensimhon, Bevelyn Buckles, MD  docusate sodium (COLACE) 100 MG capsule Take 1 capsule (100 mg total) by mouth 2 (two) times daily. 04/24/18   Bensimhon, Bevelyn Buckles, MD  enoxaparin (LOVENOX) 40 MG/0.4ML injection Inject 0.4 mLs (40 mg total) into the skin every 12 (twelve) hours. Patient not taking: Reported on 03/06/2018 02/09/18   Tonye Becket  D, NP  gabapentin (NEURONTIN) 300 MG capsule Take 1 capsule (300 mg total) by mouth 3 (three) times daily. 02/09/18   Clegg, Amy D, NP  pantoprazole (PROTONIX) 40 MG tablet TAKE 1 TABLET BY MOUTH EVERY DAY 07/19/18   Graciella Freer, PA-C  potassium chloride SA (KLOR-CON M20) 20 MEQ tablet Take 1 tablet (20 mEq total) by mouth daily. Patient not taking: Reported on 06/05/2018 02/09/18   Tonye Becket D, NP  sacubitril-valsartan (ENTRESTO) 49-51 MG Take 1 tablet by mouth 2 (two) times daily. 12/15/17   Bensimhon, Bevelyn Buckles, MD  sildenafil (REVATIO) 20 MG tablet Take  1 tablet (20 mg total) by mouth 3 (three) times daily. 04/03/18   Bensimhon, Bevelyn Buckles, MD  torsemide (DEMADEX) 20 MG tablet Take 1 tablet (20 mg total) by mouth as needed for up to 30 days. Reports that he is taking every other day 08/01/18 08/31/18  Bensimhon, Bevelyn Buckles, MD  traMADol (ULTRAM) 50 MG tablet Take 1 tablet (50 mg total) by mouth every 6 (six) hours as needed. Patient not taking: Reported on 06/05/2018 02/09/18 02/09/19  Tonye Becket D, NP  traZODone (DESYREL) 50 MG tablet Take 1 tablet (50 mg total) by mouth at bedtime. 03/06/18   Bensimhon, Bevelyn Buckles, MD  warfarin (COUMADIN) 5 MG tablet Take 5 mg by mouth daily. 5mg  daily except 7.5 mg (1 and 1/2 tablets) Wed and Fri    [provider]  zolpidem (AMBIEN) 10 MG tablet Take 1 tablet (10 mg total) by mouth at bedtime as needed for up to 30 days for sleep. 08/17/18 09/16/18  Bensimhon, Bevelyn Buckles, MD    Past Medical History: Past Medical History:  Diagnosis Date   Asthma    CHF (congestive heart failure) (HCC)    a. 09/2016: EF 20-25% with cath showing normal cors   GERD (gastroesophageal reflux disease)    History of hiatal hernia     Past Surgical History: Past Surgical History:  Procedure Laterality Date   IABP INSERTION N/A 09/05/2017   Procedure: IABP INSERTION;  Surgeon: Dolores Patty, MD;  Location: [facility];  Service: Cardiovascular;  Laterality: N/A;   INSERTION OF IMPLANTABLE LEFT VENTRICULAR ASSIST DEVICE N/A 09/06/2017   Procedure: INSERTION OF IMPLANTABLE LEFT VENTRICULAR ASSIST DEVICE - HM3;  Surgeon: Kerin Perna, MD;  Location: [facility] OR;  Service: Open Heart Surgery;  Laterality: N/A;  HM3   NASAL FRACTURE SURGERY  1987   RIGHT HEART CATH N/A 08/30/2017   Procedure: RIGHT HEART CATH;  Surgeon: Dolores Patty, MD;  Location: [facility];  Service: Cardiovascular;  Laterality: N/A;   RIGHT/LEFT HEART CATH AND CORONARY ANGIOGRAPHY N/A 10/08/2016   Procedure: Right/Left Heart Cath and  Coronary Angiography;  Surgeon: Orpah Cobb, MD;  Location: [facility];  Service: Cardiovascular;  Laterality: N/A;   TEE WITHOUT CARDIOVERSION N/A 09/06/2017   Procedure: TRANSESOPHAGEAL ECHOCARDIOGRAM (TEE);  Surgeon: Donata Clay, Theron Arista, MD;  Location: [facility] OR;  Service: Open Heart Surgery;  Laterality: N/A;    Family History: Family History  Problem Relation Age of Onset   Hypertension Father     Social History: Social History   Socioeconomic History   Marital status: Legally Separated    Spouse name: Not on file   Number of children: Not on file   Years of education: Not on file   Highest education level: Not on file  Occupational History   Not on file  Social Needs   Financial  resource strain: Not on file   Food insecurity:    Worry: Not on file    Inability: Not on file   Transportation needs:    Medical: Not on file    Non-medical: Not on file  Tobacco Use   Smoking status: Current Every Day Smoker    Packs/day: 0.50    Years: 25.00    Pack years: 12.50    Types: Cigarettes   Smokeless tobacco: Never Used   Tobacco comment: quit 1 week  Substance and Sexual Activity   Alcohol use: Yes    Comment: occas   Drug use: No   Sexual activity: Yes  Lifestyle   Physical activity:    Days per week: Not on file    Minutes per session: Not on file   Stress: Not on file  Relationships   Social connections:    Talks on phone: Not on file    Gets together: Not on file    Attends religious service: Not on file    Active member of club or organization: Not on file    Attends meetings of clubs or organizations: Not on file    Relationship status: Not on file  Other Topics Concern   Not on file  Social History Narrative   Not on file    Allergies:  No Known Allergies  Objective:    Vital Signs:       There were no vitals filed for this visit.  Mean arterial Pressure 103  Physical Exam    General:  NAD but mildly  tachypneic HEENT: Normal Neck: supple. JVP 12 cm. Carotids 2+ bilat; no bruits. No lymphadenopathy or thyromegaly appreciated. Cor: Mechanical heart sounds with LVAD hum present. Lungs: Bibasilar crackles Abdomen: soft, nontender, mildly distended. No hepatosplenomegaly. No bruits or masses. Good bowel sounds. Driveline: C/D/I; securement device intact and driveline incorporated Extremities: no cyanosis, clubbing, rash. Trace ankle edema.  Neuro: alert & orientedx3, cranial nerves grossly intact. moves all 4 extremities w/o difficulty. Affect pleasant   Telemetry   NSR in 100s (personally reviewed)  EKG   NSR with artifact (personally reviewed)  Labs    Basic Metabolic Panel: No results for input(s): NA, K, CL, CO2, GLUCOSE, BUN, CREATININE, CALCIUM, MG, PHOS in the last 168 hours.  Liver Function Tests: No results for input(s): AST, ALT, ALKPHOS, BILITOT, PROT, ALBUMIN in the last 168 hours. No results for input(s): LIPASE, AMYLASE in the last 168 hours. No results for input(s): AMMONIA in the last 168 hours.  CBC: No results for input(s): WBC, NEUTROABS, HGB, HCT, MCV, PLT in the last 168 hours.  Cardiac Enzymes: No results for input(s): CKTOTAL, CKMB, CKMBINDEX, TROPONINI in the last 168 hours.  BNP: BNP (last 3 results) Recent Labs    08/25/17 1337 09/07/17 0411 09/13/17 0348  BNP 2,510.0* 272.7* 233.6*    ProBNP (last 3 results) No results for input(s): PROBNP in the last 8760 hours.   CBG: No results for input(s): GLUCAP in the last 168 hours.  Coagulation Studies: No results for input(s): LABPROT, INR in the last 72 hours.   Imaging     No results found.    Patient Profile:   PAUBLO WARSHAWSKY is a 48 y.o. 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   Presented to ED 08/21/18 with SOB, tachypnea, and AMS in setting of taking 20 mg ambien evening prior.    Assessment/Plan:  1. Acute on Chronic systolic HF - EF 74% s/p HM-3 LVAD on 09/06/17 - Volume elevated. CXR shows CHF. Give 80 mg IV lasix now and assess response.  - Hold entresto with AKI - Continue sildenafil 20 mg TID - Have begun referral process to [facility] for transplant. He is still smoking daily. Pt aware he needs 6 months no smoking for transplant consideration.   2. AMS - Likely secondary to 20 mg ambien.  - Check head CT to rule out CVA. INR has been on low side the last few checks. INR and LDH pending.   3. Anemia-> ?GI bleed - Reports episodes of hematochezia and melena => GI consult.  - Check hemoccult - Hgb 6.4. Check type and screen. Transfuse 2 uPRBC  4. AKI - Creatinine up to 1.78. Baseline is 1.0-1.3 - As above, hold Entresto.  5. HM3 LVAD 09/06/2017.  - VAD interrogated personally. Parameters stable. - Driveline dressing changed by VAD coordinator today.  - LDH 360 (prior 220-280). Monitor closely.  - INR goal 2.0-2.5. INR 2.0. Coumadin per pharmacy.  - Hold ASA and coumadin with ?active bleed.  6. H/o Driveline infection - Driveline dressing changed by VAD coordinator.   7. Anxiety - Much improved on Celexa.  - Hold trazadone and ambien with AMS for now  8. OSA - recently sleep study with very severe OSA (AHI 69/hr) - Needs to follow up with Dr. Mayford Knife. Having difficulty finding a mask that works well.  - Will order CPAP qHS while here.    I reviewed the LVAD parameters from today, and compared the results to the patient's prior recorded data.  No programming changes were made.  The LVAD is functioning within specified parameters.  The patient performs LVAD self-test daily.  LVAD interrogation was negative for any significant power changes, alarms or PI events/speed drops.  LVAD equipment check completed and is in good working order.  Back-up equipment present.   LVAD education done on emergency procedures and precautions and reviewed exit site  care.  Length of Stay: 0  Alford Highland, NP 08/21/2018, 11:55 AM  VAD Team Pager [phone] (7am - 7am) +++VAD ISSUES ONLY+++   Advanced Heart Failure Team Pager [phone] (M-F; 7a - 4p)  Please contact [facility] for night-coverage after hours (4p -7a ) and weekends on [email or web address] for all non- LVAD Issues  Patient seen with NP, agree with the above note.   Patient is admitted with increased dyspnea and tachypnea over about 2 wks.  Also with episodes of hematochezia and melena, hgb down to 6.4 today.  No fever, WBCs normal.   On exam, JVP 12 cm, normal LVAD sounds, trace ankle edema, mildly distended abdomen, bibasilar crackles.   1. 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 He is on sildenafil for RV failure.  On exam, he is volume overloaded and weight is up at home.  LVAD parameters are stable.  LDH is mildly elevated from his baseline at 360 with INR 2.   CXR with mild pulmonary edema.  - Lasix 80 mg IV bid today, follow I/Os and creatinine.  - Continue sildenafil 20 mg tid.  - Control MAP, with elevated creatinine will hold Entresto for now, use hydralazine.  2. AKI: In setting of anemia and acute on chronic systolic CHF.  He will be getting blood today with Lasix.  3. GI bleeding: Patient has had 2 wks of episodes of both hematochezia and melena by his description.  Hgb  6.4 today, was 13.1 2 months ago.  - Hold warfarin for now, INR 2 today.  Will not use heparin bridge for now.  - I will consult GI, suspect he will need endoscopy.  4. Altered mental status: Resolving, likely due to Ambien 20 mg.  CT head showed no acute change.  5. OSA: Will give CPAP at night.  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   Marca Ancona 08/21/2018 2:14 PM

## 2018-08-21 NOTE — Consult Note (Addendum)
[facility] Consult: 2:42 PM 08/21/2018  LOS: 0 days    Referring Provider: Dr Shirlee Latch  Primary Care Physician:  Patient, No Pcp Per Primary Gastroenterologist: unassigned    Reason for Consultation: Anemia.   HPI: Jonathan Wood is a 48 y.o. male.  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  Patient still smoking cigarettes.  Severe OSA.  Gallbladder sludge, gallbladder polyp, top normal gallbladder thickness on ultrasound 08/2017.  CVA 03/2017. A couple of weeks ago he complained of constipation, 2 weeks since his last bowel movement.  He was instructed to increase his MiraLAX to twice daily and take Dulcolax daily.  Presented to the ED today with dyspnea, tachypnea O2 sats 97% on room air.  Slurred speech is likely due to Ambien.  No fever, no chills..  Heart rate to the 30s.  CXR shows mild pulmonary edema, Lasix initiated for diuresis.  CT head shows old right sided infarct. Reports 2 weeks of either dark tarry stools or red blood in his stool.  Abdomen distended.  Hgb 6.4, range 12 to 13 in Oct 2019- Jan 2020.  Pt/inr 22.4/2.  2 U PRBCs ordered.   Home meds include low-dose aspirin, Coumadin, Protonix 40 mg daily.   Currently it is difficult to get a history from the patient because he keeps drifting off to sleep.  Could not sleep last night even after taking 20 mg of Ambien this morning he is been somewhat confused but more just very somnolent, difficult to arouse. Patient still has constipation, manages to have bowel movements 2 or 3 times a week.  He is not giving me a history of tarry stools.  Denies abdominal pain, nausea, anorexia. No prior upper endoscopy or colonoscopy.  Past Medical History:  Diagnosis Date  . Asthma   . CHF (congestive heart failure) (HCC)    a. 09/2016:  EF 20-25% with cath showing normal cors  . GERD (gastroesophageal reflux disease)   . History of hiatal hernia     Past Surgical History:  Procedure Laterality Date  . IABP INSERTION N/A 09/05/2017   Procedure: IABP INSERTION;  Surgeon: Dolores Patty, MD;  Location: [facility] INVASIVE CV LAB;  Service: Cardiovascular;  Laterality: N/A;  . INSERTION OF IMPLANTABLE LEFT VENTRICULAR ASSIST DEVICE N/A 09/06/2017   Procedure: INSERTION OF IMPLANTABLE LEFT VENTRICULAR ASSIST DEVICE - HM3;  Surgeon: Kerin Perna, MD;  Location: [facility] OR;  Service: Open Heart Surgery;  Laterality: N/A;  HM3  . NASAL FRACTURE SURGERY  1987  . RIGHT HEART CATH N/A 08/30/2017   Procedure: RIGHT HEART CATH;  Surgeon: Dolores Patty, MD;  Location: [facility] INVASIVE CV LAB;  Service: Cardiovascular;  Laterality: N/A;  . RIGHT/LEFT HEART CATH AND CORONARY ANGIOGRAPHY N/A 10/08/2016   Procedure: Right/Left Heart Cath and Coronary Angiography;  Surgeon: Orpah Cobb, MD;  Location: [facility] INVASIVE CV LAB;  Service: Cardiovascular;  Laterality: N/A;  . TEE WITHOUT CARDIOVERSION N/A 09/06/2017   Procedure: TRANSESOPHAGEAL ECHOCARDIOGRAM (TEE);  Surgeon: Donata Clay, Theron Arista, MD;  Location: [facility] OR;  Service: Open Heart  Surgery;  Laterality: N/A;    Prior to Admission medications   Medication Sig Start Date End Date Taking? Authorizing Provider  amiodarone (PACERONE) 200 MG tablet Take 1 tablet (200 mg total) by mouth daily. 04/20/18  Yes Bensimhon, Bevelyn Buckles, MD  ASPIRIN LOW DOSE 81 MG EC tablet TAKE 1 TABLET BY MOUTH EVERY DAY Patient taking differently: Take 81 mg by mouth daily.  08/09/18  Yes Bensimhon, Bevelyn Buckles, MD  citalopram (CELEXA) 10 MG tablet TAKE 1 TABLET BY MOUTH ONCE DAILY Patient taking differently: Take 10 mg by mouth daily.  04/20/18  Yes Bensimhon, Bevelyn Buckles, MD  docusate sodium (COLACE) 100 MG capsule Take 1 capsule (100 mg total) by mouth 2 (two) times daily. 04/24/18  Yes Bensimhon, Bevelyn Buckles, MD  gabapentin (NEURONTIN) 300  MG capsule Take 1 capsule (300 mg total) by mouth 3 (three) times daily. 02/09/18  Yes Clegg, Amy D, NP  hydrOXYzine (ATARAX/VISTARIL) 25 MG tablet Take 25 mg by mouth every 8 (eight) hours as needed for anxiety. 08/17/18  Yes [provider]  pantoprazole (PROTONIX) 40 MG tablet TAKE 1 TABLET BY MOUTH EVERY DAY Patient taking differently: Take 40 mg by mouth daily.  07/19/18  Yes Graciella Freer, PA-C  polyethylene glycol powder (GLYCOLAX/MIRALAX) powder Take 17 g by mouth 2 (two) times daily. 07/19/18  Yes [provider]  sacubitril-valsartan (ENTRESTO) 49-51 MG Take 1 tablet by mouth 2 (two) times daily. 12/15/17  Yes Bensimhon, Bevelyn Buckles, MD  sildenafil (REVATIO) 20 MG tablet Take 1 tablet (20 mg total) by mouth 3 (three) times daily. 04/03/18  Yes Bensimhon, Bevelyn Buckles, MD  torsemide (DEMADEX) 20 MG tablet Take 1 tablet (20 mg total) by mouth as needed for up to 30 days. Reports that he is taking every other day 08/01/18 08/31/18 Yes Bensimhon, Bevelyn Buckles, MD  warfarin (COUMADIN) 5 MG tablet Take 5-7.5 mg by mouth See admin instructions. Take 7.5 mg on MON WED FRI, take 5mg  on all other days.   Yes [provider]  zolpidem (AMBIEN) 10 MG tablet Take 1 tablet (10 mg total) by mouth at bedtime as needed for up to 30 days for sleep. Patient taking differently: Take 10-20 mg by mouth at bedtime as needed for sleep.  08/17/18 09/16/18 Yes Bensimhon, Bevelyn Buckles, MD  potassium chloride SA (KLOR-CON M20) 20 MEQ tablet Take 1 tablet (20 mEq total) by mouth daily. Patient not taking: Reported on 06/05/2018 02/09/18   Tonye Becket D, NP  traZODone (DESYREL) 50 MG tablet Take 1 tablet (50 mg total) by mouth at bedtime. Patient not taking: Reported on 08/21/2018 03/06/18   Bensimhon, Bevelyn Buckles, MD    Scheduled Meds: . [START ON 08/22/2018] amiodarone  200 mg Oral Daily  . [START ON 08/22/2018] citalopram  10 mg Oral Daily  . docusate sodium  100 mg Oral BID  . furosemide  80 mg Intravenous Once  .  gabapentin  300 mg Oral TID  . [START ON 08/22/2018] pantoprazole  40 mg Oral Daily  . sildenafil  20 mg Oral TID   Infusions: . sodium chloride     PRN Meds: acetaminophen, ondansetron (ZOFRAN) IV   Allergies as of 08/21/2018  . (No Known Allergies)    Family History  Problem Relation Age of Onset  . Hypertension Father     Social History   Socioeconomic History  . Marital status: Legally Separated    Spouse name: Not on file  . Number of children: Not on  file  . Years of education: Not on file  . Highest education level: Not on file  Occupational History  . Not on file  Social Needs  . Financial resource strain: Not on file  . Food insecurity:    Worry: Not on file    Inability: Not on file  . Transportation needs:    Medical: Not on file    Non-medical: Not on file  Tobacco Use  . Smoking status: Current Every Day Smoker    Packs/day: 0.50    Years: 25.00    Pack years: 12.50    Types: Cigarettes  . Smokeless tobacco: Never Used  . Tobacco comment: quit 1 week  Substance and Sexual Activity  . Alcohol use: Yes    Comment: occas  . Drug use: No  . Sexual activity: Yes  Lifestyle  . Physical activity:    Days per week: Not on file    Minutes per session: Not on file  . Stress: Not on file  Relationships  . Social connections:    Talks on phone: Not on file    Gets together: Not on file    Attends religious service: Not on file    Active member of club or organization: Not on file    Attends meetings of clubs or organizations: Not on file    Relationship status: Not on file  . Intimate partner violence:    Fear of current or ex partner: Not on file    Emotionally abused: Not on file    Physically abused: Not on file    Forced sexual activity: Not on file  Other Topics Concern  . Not on file  Social History Narrative  . Not on file    REVIEW OF SYSTEMS: Constitutional: Weakness, fatigue. ENT:  No nose bleeds Pulm: Shortness of breath,  wheezing. CV:  No palpitations, no LE edema.  GU:  No hematuria, no frequency GI: Per HPI. Heme: No unusual or excessive bleeding or bruising. Transfusions: No previous transfusion history. Neuro:  No headaches, no peripheral tingling or numbness.  Syncope, no seizure.  No loss of limb function. Derm:  No itching, no rash or sores.  Endocrine:  No sweats or chills.  No polyuria or dysuria Immunization: The only vaccination recorded in epic is Pneumovax in 09/2016. Travel:  None beyond local counties in last few months.    PHYSICAL EXAM: Vital signs in last 24 hours: Vitals:   08/21/18 1207 08/21/18 1400  BP: 111/71 (!) 129/117  Pulse: (!) 105 90  Resp: (!) 34 (!) 25  Temp: 98.4 F (36.9 C)   SpO2: 94% 95%   Wt Readings from Last 3 Encounters:  08/21/18 108 kg  06/05/18 102.1 kg  04/24/18 104.6 kg    General: Overweight, somnolent, somewhat anxious [name] Head: No facial asymmetry or swelling.  No signs of head trauma. Eyes: No scleral icterus.  No conjunctival pallor.  EOMI. Ears: Not HOH. Nose: No discharge or congestion. Mouth: Oropharynx moist, pink, clear. Neck: No JVD, no masses, no thyromegaly. Lungs: Crackles in bases bilaterally.  Periodic grunting respirations.  Not obviously dyspneic. Heart: LVAD hum.   Abdomen: Soft, protuberant.  Not tender or distended.  Bowel sounds active.  No HSM, masses, bruits, hernias.  Benign appearing driveline site and lower abdomen.   Rectal: Deferred rectal exam. Musc/Skeltl: No gross joint swelling or deformity. Extremities: No CCE. Neurologic: Moves all 4 limbs, follows commands.  No tremors.  Alert and oriented x3. Skin: No sores,  no rash. Tattoos: 1 on the upper left chest. Nodes: No cervical adenopathy. Psych: Anxious.  Pressured speech.  Intake/Output from previous day: No intake/output data recorded. Intake/Output this shift: No intake/output data recorded.  LAB RESULTS: Recent Labs    08/21/18 1210  WBC 10.2  HGB  6.4*  HCT 23.3*  PLT 191   BMET Lab Results  Component Value Date   NA 135 08/21/2018   NA 139 06/05/2018   NA 139 04/24/2018   K 4.4 08/21/2018   K 4.4 06/05/2018   K 4.1 04/24/2018   CL 102 08/21/2018   CL 107 06/05/2018   CL 110 04/24/2018   CO2 21 (L) 08/21/2018   CO2 23 06/05/2018   CO2 21 (L) 04/24/2018   GLUCOSE 139 (H) 08/21/2018   GLUCOSE 94 06/05/2018   GLUCOSE 178 (H) 04/24/2018   BUN 25 (H) 08/21/2018   BUN 18 06/05/2018   BUN 10 04/24/2018   CREATININE 1.78 (H) 08/21/2018   CREATININE 1.34 (H) 06/05/2018   CREATININE 1.10 04/24/2018   CALCIUM 9.1 08/21/2018   CALCIUM 9.1 06/05/2018   CALCIUM 8.8 (L) 04/24/2018   LFT Recent Labs    08/21/18 1210  PROT 8.0  ALBUMIN 3.9  AST 40  ALT 25  ALKPHOS 70  BILITOT 1.0   PT/INR Lab Results  Component Value Date   INR 2.0 (H) 08/21/2018   INR 1.8 (H) 08/17/2018   INR 1.7 (H) 08/09/2018   Hepatitis Panel No results for input(s): HEPBSAG, HCVAB, HEPAIGM, HEPBIGM in the last 72 hours. C-Diff No components found for: CDIFF Lipase     Component Value Date/Time   LIPASE 25 08/25/2017 1337    Drugs of Abuse     Component Value Date/Time   LABOPIA NONE DETECTED 08/31/2017 1851   COCAINSCRNUR NONE DETECTED 08/31/2017 1851   LABBENZ POSITIVE (A) 08/31/2017 1851   AMPHETMU NONE DETECTED 08/31/2017 1851   THCU POSITIVE (A) 08/31/2017 1851   LABBARB NONE DETECTED 08/31/2017 1851     RADIOLOGY STUDIES: Ct Head Wo Contrast  Result Date: 08/21/2018 CLINICAL DATA:  Left ventricular assist device patient. Lethargic. Worsened mental status. EXAM: CT HEAD WITHOUT CONTRAST TECHNIQUE: Contiguous axial images were obtained from the base of the skull through the vertex without intravenous contrast. COMPARISON:  03/20/2017 FINDINGS: Brain: Brainstem and cerebellum appear unremarkable. Right cerebral hemisphere shows old infarction in the right parietal lobe, which was acute in 2018. Cerebral hemispheres otherwise  appear normal. No evidence of acute infarction, chronic small-vessel disease, mass, hemorrhage, hydrocephalus or extra-axial collection. Vascular: No abnormal vascular finding. Skull: Normal Sinuses/Orbits: Clear except for a retention cyst in the left division of the sphenoid sinus. Other: None IMPRESSION: No acute finding by CT. 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 Electronically Signed   By: Paulina Fusi M.D.   On: 08/21/2018 13:42   Dg Chest Portable 1 View  Result Date: 08/21/2018 CLINICAL DATA:  Severe shortness of breath today.  LVAD. EXAM: PORTABLE CHEST 1 VIEW COMPARISON:  09/18/2017 FINDINGS: There is chronic cardiomegaly with LVAD in place. There is slight new accentuation of the interstitial markings, probably representing mild interstitial edema. Pulmonary vascularity is within normal limits. No discrete effusions or consolidative infiltrates. No acute bone abnormality. IMPRESSION: Slight bilateral pulmonary edema consistent with congestive heart failure. Chronic cardiomegaly. LVAD. Electronically Signed   By: Francene Boyers M.D.   On: 08/21/2018 12:23     IMPRESSION:   *  Normocytic anemia.  Reports of dark/bloody stool.  No previous EGD or colonoscopy.  On chronic daily PPI.  *    Constipation.  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 Not a transplant candidate due to ongoing smoking.     *    Chronic constipation.    *    Excessive somnolence due to large dose of Ambien.  Baseline insomnia.  *   OSA, on CPAP.  Still smoking.     PLAN:     *  Colonoscopy/EGD?  Will d/w Dr Meridee ScoreMansouraty.  May be too sleepy to safely complete bowel prep tonight.    *   2 U PRBCs ordered though given national blood shortage, he may only receive 1 Unit.    *   Add senokot 2 po q day.  Stool hemoccult.  Ordered urine tox screen   Jennye MoccasinSarah Darsi Tien   08/21/2018, 2:42 PM Phone [phone]

## 2018-08-21 NOTE — ED Triage Notes (Signed)
Pt arrives via ems from home with SOB. Ems reports initially pt with RR of 40. Pt is LVAD pt. audible wheezing upon arrival. Pt took 2 ambien last night and did not sleep. Appears anxious. 98% RA.

## 2018-08-21 NOTE — ED Provider Notes (Signed)
[facility] EMERGENCY DEPARTMENT Provider Note   CSN: [id number] Arrival date & time: 08/21/18  1146    History   Chief Complaint Chief Complaint  Patient presents with  . Shortness of Breath    HPI Jonathan Wood is a 48 y.o. male.     The history is provided by the patient.  Shortness of Breath  Severity:  Moderate Onset quality:  Gradual Timing:  Constant Progression:  Worsening Chronicity:  New Relieved by:  Nothing Worsened by:  Activity Ineffective treatments:  None tried Associated symptoms: no abdominal pain, no chest pain, no fever and no vomiting   Patient presents emergency room for evaluation of shortness of breath that started last evening.  Patient has a complex medical history and has a left ventricular assist device.  He is followed by the LVAD team.  When EMS arrived they noted he was tachypneic.  He was wheezing.  Patient denies any fevers.  No leg swelling.  No coughing.  Denies any chest pain.  Past Medical History:  Diagnosis Date  . Asthma   . CHF (congestive heart failure) (HCC)    a. 09/2016: EF 20-25% with cath showing normal cors  . GERD (gastroesophageal reflux disease)   . History of hiatal hernia     Patient Active Problem List   Diagnosis Date Noted  . Acute systolic (congestive) heart failure (HCC) 08/21/2018  . 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/26/2017  . Anxiety 09/30/2017  . Anemia 09/30/2017  . Left ventricular assist device present (HCC) 09/21/2017  . LVAD (left ventricular assist device) present (HCC)   . Advanced care planning/counseling discussion   . Goals of care, counseling/discussion   . Palliative care encounter   . Elevated LFTs 08/25/2017  . Hepatic congestion 08/25/2017  . RUQ pain 08/25/2017  . Congestive dilated cardiomyopathy (HCC)  10/07/2016    Past Surgical History:  Procedure Laterality Date  . IABP INSERTION N/A 09/05/2017   Procedure: IABP INSERTION;  Surgeon: Dolores Patty, MD;  Location: [facility] INVASIVE CV LAB;  Service: Cardiovascular;  Laterality: N/A;  . INSERTION OF IMPLANTABLE LEFT VENTRICULAR ASSIST DEVICE N/A 09/06/2017   Procedure: INSERTION OF IMPLANTABLE LEFT VENTRICULAR ASSIST DEVICE - HM3;  Surgeon: Kerin Perna, MD;  Location: [facility] OR;  Service: Open Heart Surgery;  Laterality: N/A;  HM3  . NASAL FRACTURE SURGERY  1987  . RIGHT HEART CATH N/A 08/30/2017   Procedure: RIGHT HEART CATH;  Surgeon: Dolores Patty, MD;  Location: [facility] INVASIVE CV LAB;  Service: Cardiovascular;  Laterality: N/A;  . RIGHT/LEFT HEART CATH AND CORONARY ANGIOGRAPHY N/A 10/08/2016   Procedure: Right/Left Heart Cath and Coronary Angiography;  Surgeon: Orpah Cobb, MD;  Location: [facility] INVASIVE CV LAB;  Service: Cardiovascular;  Laterality: N/A;  . TEE WITHOUT CARDIOVERSION N/A 09/06/2017   Procedure: TRANSESOPHAGEAL ECHOCARDIOGRAM (TEE);  Surgeon: Donata Clay, Theron Arista, MD;  Location: [facility] OR;  Service: Open Heart Surgery;  Laterality: N/A;        Home Medications    Prior to Admission medications   Medication Sig Start Date End Date Taking? Authorizing Provider  amiodarone (PACERONE) 200 MG tablet Take 1 tablet (200 mg total) by mouth daily. 04/20/18   Bensimhon, Bevelyn Buckles, MD  ASPIRIN LOW DOSE 81 MG EC tablet TAKE 1 TABLET BY MOUTH EVERY DAY 08/09/18   Bensimhon, Bevelyn Buckles, MD  citalopram (CELEXA) 10  MG tablet TAKE 1 TABLET BY MOUTH ONCE DAILY 04/20/18   Bensimhon, Bevelyn Buckles, MD  docusate sodium (COLACE) 100 MG capsule Take 1 capsule (100 mg total) by mouth 2 (two) times daily. 04/24/18   Bensimhon, Bevelyn Buckles, MD  enoxaparin (LOVENOX) 40 MG/0.4ML injection Inject 0.4 mLs (40 mg total) into the skin every 12 (twelve) hours. Patient not taking: Reported on 03/06/2018 02/09/18   Tonye Becket D, NP  gabapentin (NEURONTIN) 300 MG capsule Take  1 capsule (300 mg total) by mouth 3 (three) times daily. 02/09/18   Clegg, Amy D, NP  pantoprazole (PROTONIX) 40 MG tablet TAKE 1 TABLET BY MOUTH EVERY DAY 07/19/18   Graciella Freer, PA-C  potassium chloride SA (KLOR-CON M20) 20 MEQ tablet Take 1 tablet (20 mEq total) by mouth daily. Patient not taking: Reported on 06/05/2018 02/09/18   Tonye Becket D, NP  sacubitril-valsartan (ENTRESTO) 49-51 MG Take 1 tablet by mouth 2 (two) times daily. 12/15/17   Bensimhon, Bevelyn Buckles, MD  sildenafil (REVATIO) 20 MG tablet Take 1 tablet (20 mg total) by mouth 3 (three) times daily. 04/03/18   Bensimhon, Bevelyn Buckles, MD  torsemide (DEMADEX) 20 MG tablet Take 1 tablet (20 mg total) by mouth as needed for up to 30 days. Reports that he is taking every other day 08/01/18 08/31/18  Bensimhon, Bevelyn Buckles, MD  traMADol (ULTRAM) 50 MG tablet Take 1 tablet (50 mg total) by mouth every 6 (six) hours as needed. Patient not taking: Reported on 06/05/2018 02/09/18 02/09/19  Tonye Becket D, NP  traZODone (DESYREL) 50 MG tablet Take 1 tablet (50 mg total) by mouth at bedtime. 03/06/18   Bensimhon, Bevelyn Buckles, MD  warfarin (COUMADIN) 5 MG tablet Take 5 mg by mouth daily. 5mg  daily except 7.5 mg (1 and 1/2 tablets) Wed and Fri    [provider]  zolpidem (AMBIEN) 10 MG tablet Take 1 tablet (10 mg total) by mouth at bedtime as needed for up to 30 days for sleep. 08/17/18 09/16/18  Bensimhon, Bevelyn Buckles, MD    Family History Family History  Problem Relation Age of Onset  . Hypertension Father     Social History Social History   Tobacco Use  . Smoking status: Current Every Day Smoker    Packs/day: 0.50    Years: 25.00    Pack years: 12.50    Types: Cigarettes  . Smokeless tobacco: Never Used  . Tobacco comment: quit 1 week  Substance Use Topics  . Alcohol use: Yes    Comment: occas  . Drug use: No     Allergies   Patient has no known allergies.   Review of Systems Review of Systems  Constitutional: Negative for  fever.  Respiratory: Positive for shortness of breath.   Cardiovascular: Negative for chest pain.  Gastrointestinal: Negative for abdominal pain and vomiting.  All other systems reviewed and are negative.    Physical Exam Updated Vital Signs BP 111/71 (BP Location: Left Arm)   Pulse (!) 105   Temp 98.4 F (36.9 C) (Oral)   Resp (!) 34   Ht 1.905 m (6\' 3" )   Wt 108 kg   SpO2 94%   BMI 29.75 kg/m   Physical Exam Vitals signs and nursing note reviewed.  Constitutional:      General: He is not in acute distress.    Appearance: He is ill-appearing.  HENT:     Head: Normocephalic and atraumatic.     Right Ear: External ear normal.  Left Ear: External ear normal.  Eyes:     General: No scleral icterus.       Right eye: No discharge.        Left eye: No discharge.     Conjunctiva/sclera: Conjunctivae normal.  Neck:     Musculoskeletal: Neck supple.     Trachea: No tracheal deviation.  Cardiovascular:     Rate and Rhythm: Normal rate and regular rhythm.  Pulmonary:     Effort: Tachypnea and accessory muscle usage present.     Breath sounds: Normal breath sounds. No stridor. No wheezing or rales.  Chest:     Chest wall: No mass.  Abdominal:     General: Bowel sounds are normal. There is no distension.     Palpations: Abdomen is soft.     Tenderness: There is no abdominal tenderness. There is no guarding or rebound.  Musculoskeletal:        General: No tenderness.     Right lower leg: No edema.     Left lower leg: No edema.  Skin:    General: Skin is warm and dry.     Findings: No rash.  Neurological:     Mental Status: He is alert.     Cranial Nerves: No cranial nerve deficit (no facial droop, extraocular movements intact, no slurred speech).     Sensory: No sensory deficit.     Motor: No abnormal muscle tone or seizure activity.     Coordination: Coordination normal.      ED Treatments / Results  Labs (all labs ordered are listed, but only abnormal  results are displayed) Labs Reviewed  COMPREHENSIVE METABOLIC PANEL - Abnormal; Notable for the following components:      Result Value   CO2 21 (*)    Glucose, Bld 139 (*)    BUN 25 (*)    Creatinine, Ser 1.78 (*)    GFR calc non Af Amer 45 (*)    GFR calc Af Amer 52 (*)    All other components within normal limits  CBC - Abnormal; Notable for the following components:   RBC 2.78 (*)    Hemoglobin 6.4 (*)    HCT 23.3 (*)    MCH 23.0 (*)    MCHC 27.5 (*)    RDW 18.7 (*)    nRBC 0.4 (*)    All other components within normal limits  LACTATE DEHYDROGENASE - Abnormal; Notable for the following components:   LDH 360 (*)    All other components within normal limits  PROTIME-INR - Abnormal; Notable for the following components:   Prothrombin Time 22.4 (*)    INR 2.0 (*)    All other components within normal limits  TYPE AND SCREEN  PREPARE RBC (CROSSMATCH)    EKG None  Radiology Dg Chest Portable 1 View  Result Date: 08/21/2018 CLINICAL DATA:  Severe shortness of breath today.  LVAD. EXAM: PORTABLE CHEST 1 VIEW COMPARISON:  09/18/2017 FINDINGS: There is chronic cardiomegaly with LVAD in place. There is slight new accentuation of the interstitial markings, probably representing mild interstitial edema. Pulmonary vascularity is within normal limits. No discrete effusions or consolidative infiltrates. No acute bone abnormality. IMPRESSION: Slight bilateral pulmonary edema consistent with congestive heart failure. Chronic cardiomegaly. LVAD. Electronically Signed   By: Francene Boyers M.D.   On: 08/21/2018 12:23    Procedures .Critical Care Performed by: Linwood Dibbles, MD Authorized by: Linwood Dibbles, MD   Critical care provider statement:    Critical care  time (minutes):  45   Critical care was time spent personally by me on the following activities:  Discussions with consultants, evaluation of patient's response to treatment, examination of patient, ordering and performing treatments  and interventions, ordering and review of laboratory studies, ordering and review of radiographic studies, pulse oximetry, re-evaluation of patient's condition, obtaining history from patient or surrogate and review of old charts   (including critical care time)  Medications Ordered in ED Medications  0.9 %  sodium chloride infusion (has no administration in time range)  amiodarone (PACERONE) tablet 200 mg (has no administration in time range)  sacubitril-valsartan (ENTRESTO) 49-51 mg per tablet (has no administration in time range)  sildenafil (REVATIO) tablet 20 mg (has no administration in time range)  citalopram (CELEXA) tablet 10 mg (has no administration in time range)  docusate sodium (COLACE) capsule 100 mg (has no administration in time range)  pantoprazole (PROTONIX) EC tablet 40 mg (has no administration in time range)  gabapentin (NEURONTIN) capsule 300 mg (has no administration in time range)  acetaminophen (TYLENOL) tablet 650 mg (has no administration in time range)  ondansetron (ZOFRAN) injection 4 mg (has no administration in time range)  Warfarin - Pharmacist Dosing Inpatient (has no administration in time range)  warfarin (COUMADIN) tablet 7.5 mg (has no administration in time range)  albuterol (PROVENTIL HFA;VENTOLIN HFA) 108 (90 Base) MCG/ACT inhaler 2 puff (2 puffs Inhalation Given 08/21/18 1225)  furosemide (LASIX) injection 80 mg (80 mg Intravenous Given 08/21/18 1303)     Initial Impression / Assessment and Plan / ED Course  I have reviewed the triage vital signs and the nursing notes.  Pertinent labs & imaging results that were available during my care of the patient were reviewed by me and considered in my medical decision making (see chart for details).  Clinical Course as of Aug 21 1319  Mon Aug 21, 2018  1320 Lactate dehydrogenase(!) [JK]    Clinical Course User Index [JK] Linwood Dibbles, MD  Patient presented to the emergency room for evaluation of shortness  of breath.  Patient has a complex medical history including an LVAD.   Patient's chest x-ray suggests a component of pulmonary edema.  He was given a dose of Lasix.  Patient's hemoglobin also was significantly decreased from previous values.  Patient is therapeutic on his Coumadin.  He is not thrombocytopenic but his hemoglobin is 6.4.  This could be related to hemolysis however the patient recently admitted that he has noticed some blood in his stool recently.  GI bleed is also a consideration.  The LVAD team has been down in the ED evaluating the patient.  He will be admitted for further treatment.  Final Clinical Impressions(s) / ED Diagnoses   Final diagnoses:  Congestive heart failure, unspecified HF chronicity, unspecified heart failure type (HCC)  Anemia, unspecified type       Linwood Dibbles, MD 08/21/18 1321

## 2018-08-21 NOTE — Telephone Encounter (Signed)
CSW received call this morning from patient who has called paramedics as "I am not feeling well". CSW immediately contacted VAD Coordinator who called patient back and provided instructions for paramedics.  Patient's mother later called very upset and concerned about patient's medical status. CSW provided supportive intervention and encouraged mother to stay home and not come to the hospital as no visitors allowed at this time due to covid-19. CSW assured mother that VAD Coordinator would provide update via phone once he arrived in the ED and care was initiated. Mother appreciative of support and verbalizes understanding. Lasandra Beech, LCSW, CCSW-MCS [phone]

## 2018-08-22 ENCOUNTER — Other Ambulatory Visit: Payer: Self-pay | Admitting: Internal Medicine

## 2018-08-22 DIAGNOSIS — Z95811 Presence of heart assist device: Secondary | ICD-10-CM

## 2018-08-22 DIAGNOSIS — I5021 Acute systolic (congestive) heart failure: Secondary | ICD-10-CM

## 2018-08-22 DIAGNOSIS — D649 Anemia, unspecified: Secondary | ICD-10-CM

## 2018-08-22 DIAGNOSIS — Z7901 Long term (current) use of anticoagulants: Secondary | ICD-10-CM

## 2018-08-22 DIAGNOSIS — K625 Hemorrhage of anus and rectum: Secondary | ICD-10-CM

## 2018-08-22 LAB — DRUG PROFILE, UR, 9 DRUGS (LABCORP)
Amphetamines, Urine: NEGATIVE ng/mL
Barbiturate, Ur: NEGATIVE ng/mL
Benzodiazepine Quant, Ur: NEGATIVE ng/mL
Cannabinoid Quant, Ur: NEGATIVE ng/mL
Cocaine (Metab.): NEGATIVE ng/mL
Methadone Screen, Urine: NEGATIVE ng/mL
Opiate Quant, Ur: NEGATIVE ng/mL
Phencyclidine, Ur: NEGATIVE ng/mL
Propoxyphene, Urine: NEGATIVE ng/mL

## 2018-08-22 LAB — BASIC METABOLIC PANEL
Anion gap: 8 (ref 5–15)
BUN: 21 mg/dL — ABNORMAL HIGH (ref 6–20)
CO2: 27 mmol/L (ref 22–32)
Calcium: 8.8 mg/dL — ABNORMAL LOW (ref 8.9–10.3)
Chloride: 101 mmol/L (ref 98–111)
Creatinine, Ser: 1.61 mg/dL — ABNORMAL HIGH (ref 0.61–1.24)
GFR calc Af Amer: 59 mL/min — ABNORMAL LOW (ref >60)
GFR calc non Af Amer: 51 mL/min — ABNORMAL LOW (ref >60)
Glucose, Bld: 117 mg/dL — ABNORMAL HIGH (ref 70–99)
Potassium: 3.7 mmol/L (ref 3.5–5.1)
Sodium: 136 mmol/L (ref 135–145)

## 2018-08-22 LAB — CBC
HCT: 23.9 % — ABNORMAL LOW (ref 39.0–52.0)
Hemoglobin: 7.3 g/dL — ABNORMAL LOW (ref 13.0–17.0)
MCH: 24.7 pg — ABNORMAL LOW (ref 26.0–34.0)
MCHC: 30.5 g/dL (ref 30.0–36.0)
MCV: 81 fL (ref 80.0–100.0)
Platelets: 189 10*3/uL (ref 150–400)
RBC: 2.95 MIL/uL — ABNORMAL LOW (ref 4.22–5.81)
RDW: 18.2 % — ABNORMAL HIGH (ref 11.5–15.5)
WBC: 7.4 10*3/uL (ref 4.0–10.5)
nRBC: 0.5 % — ABNORMAL HIGH (ref 0.0–0.2)

## 2018-08-22 LAB — LACTATE DEHYDROGENASE: LDH: 364 U/L — ABNORMAL HIGH (ref 98–192)

## 2018-08-22 LAB — PREPARE RBC (CROSSMATCH)

## 2018-08-22 LAB — PROTIME-INR
INR: 1.8 — ABNORMAL HIGH (ref 0.8–1.2)
Prothrombin Time: 20.6 seconds — ABNORMAL HIGH (ref 11.4–15.2)

## 2018-08-22 MED ORDER — BISACODYL 5 MG PO TBEC
20.0000 mg | DELAYED_RELEASE_TABLET | Freq: Once | ORAL | Status: AC
  Administered 2018-08-22: 20 mg via ORAL
  Filled 2018-08-22: qty 4

## 2018-08-22 MED ORDER — POTASSIUM CHLORIDE CRYS ER 20 MEQ PO TBCR
40.0000 meq | EXTENDED_RELEASE_TABLET | Freq: Once | ORAL | Status: AC
  Administered 2018-08-22: 40 meq via ORAL
  Filled 2018-08-22: qty 2

## 2018-08-22 MED ORDER — METOCLOPRAMIDE HCL 5 MG/ML IJ SOLN
10.0000 mg | Freq: Once | INTRAMUSCULAR | Status: AC
  Administered 2018-08-23: 10 mg via INTRAVENOUS
  Filled 2018-08-22: qty 2

## 2018-08-22 MED ORDER — FUROSEMIDE 10 MG/ML IJ SOLN
80.0000 mg | Freq: Two times a day (BID) | INTRAMUSCULAR | Status: DC
  Administered 2018-08-22 – 2018-08-26 (×9): 80 mg via INTRAVENOUS
  Filled 2018-08-22 (×10): qty 8

## 2018-08-22 MED ORDER — METOCLOPRAMIDE HCL 5 MG/ML IJ SOLN
10.0000 mg | Freq: Once | INTRAMUSCULAR | Status: AC
  Administered 2018-08-22: 10 mg via INTRAVENOUS
  Filled 2018-08-22: qty 2

## 2018-08-22 MED ORDER — PEG-KCL-NACL-NASULF-NA ASC-C 100 G PO SOLR
0.5000 | Freq: Once | ORAL | Status: DC
  Filled 2018-08-22: qty 1

## 2018-08-22 MED ORDER — PEG-KCL-NACL-NASULF-NA ASC-C 100 G PO SOLR: 0.5000 | Freq: Once | ORAL | Status: DC

## 2018-08-22 MED ORDER — SODIUM CHLORIDE 0.9% IV SOLUTION: Freq: Once | INTRAVENOUS | Status: DC

## 2018-08-22 MED ORDER — PEG 3350-KCL-NABCB-NACL-NASULF 236 G PO SOLR
4000.0000 mL | Freq: Once | ORAL | Status: AC
  Administered 2018-08-22: 4000 mL via ORAL
  Filled 2018-08-22: qty 4000

## 2018-08-22 MED ORDER — PEG-KCL-NACL-NASULF-NA ASC-C 100 G PO SOLR: 1.0000 | Freq: Once | ORAL | Status: DC

## 2018-08-22 NOTE — Progress Notes (Signed)
Daily Rounding Note  08/22/2018, 8:29 AM  LOS: 1 day   SUBJECTIVE:   Chief complaint:  FOBT + stool.  Acute anemia in LVAD pt on Coumadin    Somnolence resolved.  States it was scary yesterday as he had halluncinations. Small, dark stool yesterday.   No nausea or abd pain.  Still slightly SOB beyond baseline but breathing improved.  Not dizzy with standing up for weigh in.      OBJECTIVE:         Vital signs in last 24 hours:    Temp:  [97.8 F (36.6 C)-98.4 F (36.9 C)] 97.8 F (36.6 C) (04/07 0400) Pulse Rate:  [37-119] 95 (04/07 0700) Resp:  [25-37] 28 (04/06 1645) BP: (79-146)/(12-121) 90/12 (04/07 0700) SpO2:  [88 %-100 %] 98 % (04/07 0700) Weight:  [104.6 kg-108 kg] 104.6 kg (04/07 0600) Last BM Date: 08/19/18 Filed Weights   08/21/18 1205 08/22/18 0600  Weight: 108 kg 104.6 kg   General: pleasant, alert, in good spirits   Heart: VAD hum and rythm Chest: clear bil.  No cough or SOB Abdomen: soft, obese, NT, driveline site bandaged.  BS hypoactive  Extremities: no edema Neuro/Psych:  Pleasant, cooperative, fully alert/oriented.  Dramatic change from yesterday.  Intake/Output from previous day: 04/06 0701 - 04/07 0700 In: 484 [P.O.:220; I.V.:10; Blood:254] Out: 1850 [Urine:1850]  Intake/Output this shift: No intake/output data recorded.  Lab Results: Recent Labs    08/21/18 1210 08/21/18 1944 08/22/18 0412  WBC 10.2 8.3 7.4  HGB 6.4* 7.4* 7.3*  HCT 23.3* 24.2* 23.9*  PLT 191 171 189   BMET Recent Labs    08/21/18 1210 08/22/18 0412  NA 135 136  K 4.4 3.7  CL 102 101  CO2 21* 27  GLUCOSE 139* 117*  BUN 25* 21*  CREATININE 1.78* 1.61*  CALCIUM 9.1 8.8*   LFT Recent Labs    08/21/18 1210  PROT 8.0  ALBUMIN 3.9  AST 40  ALT 25  ALKPHOS 70  BILITOT 1.0   PT/INR Recent Labs    08/21/18 1210 08/22/18 0412  LABPROT 22.4* 20.6*  INR 2.0* 1.8*   Hepatitis Panel No results for  input(s): HEPBSAG, HCVAB, HEPAIGM, HEPBIGM in the last 72 hours.  Studies/Results: Ct Head Wo Contrast  Result Date: 08/21/2018 CLINICAL DATA:  Left ventricular assist device patient. Lethargic. Worsened mental status. EXAM: CT HEAD WITHOUT CONTRAST TECHNIQUE: Contiguous axial images were obtained from the base of the skull through the vertex without intravenous contrast. COMPARISON:  03/20/2017 FINDINGS: Brain: Brainstem and cerebellum appear unremarkable. Right cerebral hemisphere shows old infarction in the right parietal lobe, which was acute in 2018. Cerebral hemispheres otherwise appear normal. No evidence of acute infarction, chronic small-vessel disease, mass, hemorrhage, hydrocephalus or extra-axial collection. Vascular: No abnormal vascular finding. Skull: Normal Sinuses/Orbits: Clear except for a retention cyst in the left division of the sphenoid sinus. Other: None IMPRESSION: No acute finding by CT. 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 Electronically Signed   By: Paulina Fusi M.D.   On: 08/21/2018 13:42   Dg Chest Portable 1 View  Result Date: 08/21/2018 CLINICAL DATA:  Severe shortness of breath today.  LVAD. EXAM: PORTABLE CHEST 1 VIEW COMPARISON:  09/18/2017 FINDINGS: There is chronic cardiomegaly with LVAD in place. There is slight new accentuation of the interstitial markings, probably representing mild interstitial edema. Pulmonary vascularity is  within normal limits. No discrete effusions or consolidative infiltrates. No acute bone abnormality. IMPRESSION: Slight bilateral pulmonary edema consistent with congestive heart failure. Chronic cardiomegaly. LVAD. Electronically Signed   By: James  Maxwell M.D.   On: 08/21/2018 12:23    ASSESMENT:   *     Acute, normocytic anemia.  Reports of dark/bloody stool.  FOBT +.  No previous EGD or colonoscopy.  On chronic daily PPI.  6.4 >> 7.3 after 1 U PRBC.  *    Constipation.   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  Chronic Coumadin.  INR 1.8.      *    Excessive somnolence due to large dose of Ambien.  Baseline insomnia.  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    *   OSA, on CPAP.  Still smoking.      PLAN   *  Enteroscopy and colonoscopy. Set for noon tomorrow.  Pt agreeable.       Carnelia Oscar  08/22/2018, 8:29 AM Phone [phone] 

## 2018-08-22 NOTE — Plan of Care (Signed)
  Problem: Education: Goal: Knowledge of General Education information will improve Description Including pain rating scale, medication(s)/side effects and non-pharmacologic comfort measures Outcome: Progressing   Problem: Health Behavior/Discharge Planning: Goal: Ability to manage health-related needs will improve Outcome: Progressing   Problem: Clinical Measurements: Goal: Ability to maintain clinical measurements within normal limits will improve Outcome: Progressing Goal: Will remain free from infection Outcome: Progressing Goal: Diagnostic test results will improve Outcome: Progressing Goal: Respiratory complications will improve Outcome: Progressing Goal: Cardiovascular complication will be avoided Outcome: Progressing   Problem: Activity: Goal: Risk for activity intolerance will decrease Outcome: Progressing   Problem: Nutrition: Goal: Adequate nutrition will be maintained Outcome: Progressing   Problem: Coping: Goal: Level of anxiety will decrease Outcome: Progressing   Problem: Elimination: Goal: Will not experience complications related to bowel motility Outcome: Progressing Goal: Will not experience complications related to urinary retention Outcome: Progressing   Problem: Pain Managment: Goal: General experience of comfort will improve Outcome: Progressing   Problem: Safety: Goal: Ability to remain free from injury will improve Outcome: Progressing   Problem: Skin Integrity: Goal: Risk for impaired skin integrity will decrease Outcome: Progressing   Problem: Cardiac: Goal: Ability to maintain an adequate cardiac output will improve Outcome: Progressing   Problem: Respiratory: Goal: Will regain and/or maintain adequate ventilation Outcome: Progressing

## 2018-08-22 NOTE — Progress Notes (Signed)
ANTICOAGULATION CONSULT NOTE - Initial Consult  Pharmacy Consult for warfarin Indication: LVAD  No Known Allergies  Patient Measurements: Height: 6\' 3"  (190.5 cm) Weight: 230 lb 9.6 oz (104.6 kg) IBW/kg (Calculated) : 84.5  Vital Signs: Temp: 97.8 F (36.6 C) (04/07 0400) Temp Source: Oral (04/07 0400) BP: 90/12 (04/07 0700) Pulse Rate: 95 (04/07 0700)  Labs: Recent Labs    08/21/18 1210 08/21/18 1944 08/22/18 0412  HGB 6.4* 7.4* 7.3*  HCT 23.3* 24.2* 23.9*  PLT 191 171 189  LABPROT 22.4*  --  20.6*  INR 2.0*  --  1.8*  CREATININE 1.78*  --  1.61*    Estimated Creatinine Clearance: 75.8 mL/min (A) (by C-G formula based on SCr of 1.61 mg/dL (H)).   Medical History: Past Medical History:  Diagnosis Date  . Asthma   . CHF (congestive heart failure) (HCC)    a. 09/2016: EF 20-25% with cath showing normal cors  . GERD (gastroesophageal reflux disease)   . History of hiatal hernia    Assessment: 47 yom with LVAD on warfarin PTA presenting with SOB, AMS. Hgb improved to 7.3 after 1 unit yesterday.   Goal of Therapy:  INR goal 2-2.5 Monitor platelets by anticoagulation protocol: Yes   Plan:  Hold warfarin today Follow up after scope tomorrow  Sheppard Coil PharmD., BCPS Clinical Pharmacist 08/22/2018 8:43 AM

## 2018-08-22 NOTE — Progress Notes (Signed)
Patient ID: Jonathan Wood, male   DOB: [birth year], 48 y.o.   MRN: [id number]   Advanced Heart Failure VAD Team Note  PCP-Cardiologist: Arvilla Meres, MD   Subjective:    Patient got 1 unit PRBCs yesterday, hgb 6.4 => 7.3.  FOBT+.  He did not have a bowel movement.  Seen by GI, plan for enteroscopy/colonoscopy tomorrow.  INR 1.8 today. LDH 360=>364.  He diuresed well with IV Lasix, weight down.  Creatinine lower today at 1.6.  Still feels short of breath and has cough.  No fever.   LVAD INTERROGATION:  HeartMate 3 LVAD:   Flow 4.9 liters/min, speed 5600, power 3.9, PI 4.2.  No PI events.    Objective:    Vital Signs:   Temp:  [97.8 F (36.6 C)-98.4 F (36.9 C)] 97.8 F (36.6 C) (04/07 0400) Pulse Rate:  [37-119] 95 (04/07 0700) Resp:  [25-37] 28 (04/06 1645) BP: (79-146)/(12-121) 90/12 (04/07 0700) SpO2:  [88 %-100 %] 98 % (04/07 0700) Weight:  [104.6 kg-108 kg] 104.6 kg (04/07 0600) Last BM Date: 08/19/18 Mean arterial Pressure 80  Intake/Output:   Intake/Output Summary (Last 24 hours) at 08/22/2018 0847 Last data filed at 08/22/2018 0700 Gross per 24 hour  Intake 484 ml  Output 1850 ml  Net -1366 ml     Physical Exam    General:  Well appearing. No resp difficulty HEENT: normal Neck: supple. JVP 10-12 cm. Carotids 2+ bilat; no bruits. No lymphadenopathy or thyromegaly appreciated. Cor: Mechanical heart sounds with LVAD hum present. Lungs: clear Abdomen: soft, nontender, mildly distended. No hepatosplenomegaly. No bruits or masses. Good bowel sounds. Driveline: C/D/I; securement device intact and driveline incorporated Extremities: no cyanosis, clubbing, rash, edema Neuro: alert & orientedx3, cranial nerves grossly intact. moves all 4 extremities w/o difficulty. Affect pleasant   Telemetry   NSR 90s (personally reviewed)   Labs   Basic Metabolic Panel: Recent Labs  Lab 08/21/18 1210 08/22/18 0412  NA 135 136  K 4.4 3.7  CL 102 101  CO2 21* 27   GLUCOSE 139* 117*  BUN 25* 21*  CREATININE 1.78* 1.61*  CALCIUM 9.1 8.8*    Liver Function Tests: Recent Labs  Lab 08/21/18 1210  AST 40  ALT 25  ALKPHOS 70  BILITOT 1.0  PROT 8.0  ALBUMIN 3.9   No results for input(s): LIPASE, AMYLASE in the last 168 hours. No results for input(s): AMMONIA in the last 168 hours.  CBC: Recent Labs  Lab 08/21/18 1210 08/21/18 1944 08/22/18 0412  WBC 10.2 8.3 7.4  HGB 6.4* 7.4* 7.3*  HCT 23.3* 24.2* 23.9*  MCV 83.8 82.0 81.0  PLT 191 171 189    INR: Recent Labs  Lab 08/17/18 1454 08/21/18 1210 08/22/18 0412  INR 1.8* 2.0* 1.8*    Other results:  EKG:    Imaging   Ct Head Wo Contrast  Result Date: 08/21/2018 CLINICAL DATA:  Left ventricular assist device patient. Lethargic. Worsened mental status. EXAM: CT HEAD WITHOUT CONTRAST TECHNIQUE: Contiguous axial images were obtained from the base of the skull through the vertex without intravenous contrast. COMPARISON:  03/20/2017 FINDINGS: Brain: Brainstem and cerebellum appear unremarkable. Right cerebral hemisphere shows old infarction in the right parietal lobe, which was acute in 2018. Cerebral hemispheres otherwise appear normal. No evidence of acute infarction, chronic small-vessel disease, mass, hemorrhage, hydrocephalus or extra-axial collection. Vascular: No abnormal vascular finding. Skull: Normal Sinuses/Orbits: Clear except for a retention cyst in the left division of the sphenoid sinus.  Other: None IMPRESSION: No acute finding by CT. 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 Electronically Signed   By: Paulina Fusi M.D.   On: 08/21/2018 13:42   Dg Chest Portable 1 View  Result Date: 08/21/2018 CLINICAL DATA:  Severe shortness of breath today.  LVAD. EXAM: PORTABLE CHEST 1 VIEW COMPARISON:  09/18/2017 FINDINGS: There is chronic cardiomegaly with LVAD in place. There is slight new accentuation of the interstitial  markings, probably representing mild interstitial edema. Pulmonary vascularity is within normal limits. No discrete effusions or consolidative infiltrates. No acute bone abnormality. IMPRESSION: Slight bilateral pulmonary edema consistent with congestive heart failure. Chronic cardiomegaly. LVAD. Electronically Signed   By: Francene Boyers M.D.   On: 08/21/2018 12:23      Medications:     Scheduled Medications: . sodium chloride   Intravenous Once  . amiodarone  200 mg Oral Daily  . citalopram  10 mg Oral Daily  . docusate sodium  100 mg Oral BID  . furosemide  80 mg Intravenous BID  . gabapentin  300 mg Oral TID  . pantoprazole  40 mg Oral Daily  . potassium chloride  40 mEq Oral Once  . senna  2 tablet Oral Daily  . sildenafil  20 mg Oral TID     Infusions: . sodium chloride       PRN Medications:  acetaminophen, ondansetron (ZOFRAN) IV    Assessment/Plan:    1. 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 He is on sildenafil for RV failure.  LVAD parameters are stable.  He was volume overloaded at admission, likely related to a degree of RV failure.  Good diuresis yesterday, creatinine down 1.78 => 1.6.  Still volume overloaded on exam. - Lasix 80 mg IV bid today again, follow I/Os and creatinine.  - Continue sildenafil 20 mg tid.  Sherryll Burger on hold with elevated creatinine, MAP stable around 80.   2. AKI: In setting of anemia and acute on chronic systolic CHF.  Creatinine lower today with diuresis and blood.  3. GI bleeding: Patient has had 2 wks of episodes of both hematochezia and melena by his description.  Hgb 6.4 on admission, was 13.1 2 months ago.  1 unit PRBCs 4/6, hgb 7.3.  - Given active GI bleeding, will see if we can get an additional 1 unit PRBCs today.  - Hold warfarin for now, INR 1.8 today.  No heparin gtt today.  Plan for enteroscopy/colonoscopy tomorrow.   4. Altered mental status: Resolved, likely due to Ambien 20 mg.   CT head showed no acute change.  5. OSA: Will give CPAP at night.  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   I reviewed the LVAD parameters from today, and compared the results to the patient's prior recorded data.  No programming changes were made.  The LVAD is functioning within specified parameters.  The patient performs LVAD self-test daily.  LVAD interrogation was negative for any significant power changes, alarms or PI events/speed drops.  LVAD equipment check completed and is in good working order.  Back-up equipment present.   LVAD education done on emergency procedures and precautions and reviewed exit site care.  Length of Stay: 1  Marca Ancona, MD 08/22/2018, 8:47 AM  VAD Team --- VAD ISSUES ONLY--- Pager [phone] (7am - 7am)  Advanced Heart Failure Team  Pager [phone] (M-F; 7a - 4p)  Please contact [facility] for night-coverage after hours (4p -7a ) and weekends on [email or web address]

## 2018-08-22 NOTE — H&P (View-Only) (Signed)
Daily Rounding Note  08/22/2018, 8:29 AM  LOS: 1 day   SUBJECTIVE:   Chief complaint:  FOBT + stool.  Acute anemia in LVAD pt on Coumadin    Somnolence resolved.  States it was scary yesterday as he had halluncinations. Small, dark stool yesterday.   No nausea or abd pain.  Still slightly SOB beyond baseline but breathing improved.  Not dizzy with standing up for weigh in.      OBJECTIVE:         Vital signs in last 24 hours:    Temp:  [97.8 F (36.6 C)-98.4 F (36.9 C)] 97.8 F (36.6 C) (04/07 0400) Pulse Rate:  [37-119] 95 (04/07 0700) Resp:  [25-37] 28 (04/06 1645) BP: (79-146)/(12-121) 90/12 (04/07 0700) SpO2:  [88 %-100 %] 98 % (04/07 0700) Weight:  [104.6 kg-108 kg] 104.6 kg (04/07 0600) Last BM Date: 08/19/18 Filed Weights   08/21/18 1205 08/22/18 0600  Weight: 108 kg 104.6 kg   General: pleasant, alert, in good spirits   Heart: VAD hum and rythm Chest: clear bil.  No cough or SOB Abdomen: soft, obese, NT, driveline site bandaged.  BS hypoactive  Extremities: no edema Neuro/Psych:  Pleasant, cooperative, fully alert/oriented.  Dramatic change from yesterday.  Intake/Output from previous day: 04/06 0701 - 04/07 0700 In: 484 [P.O.:220; I.V.:10; Blood:254] Out: 1850 [Urine:1850]  Intake/Output this shift: No intake/output data recorded.  Lab Results: Recent Labs    08/21/18 1210 08/21/18 1944 08/22/18 0412  WBC 10.2 8.3 7.4  HGB 6.4* 7.4* 7.3*  HCT 23.3* 24.2* 23.9*  PLT 191 171 189   BMET Recent Labs    08/21/18 1210 08/22/18 0412  NA 135 136  K 4.4 3.7  CL 102 101  CO2 21* 27  GLUCOSE 139* 117*  BUN 25* 21*  CREATININE 1.78* 1.61*  CALCIUM 9.1 8.8*   LFT Recent Labs    08/21/18 1210  PROT 8.0  ALBUMIN 3.9  AST 40  ALT 25  ALKPHOS 70  BILITOT 1.0   PT/INR Recent Labs    08/21/18 1210 08/22/18 0412  LABPROT 22.4* 20.6*  INR 2.0* 1.8*   Hepatitis Panel No results for  input(s): HEPBSAG, HCVAB, HEPAIGM, HEPBIGM in the last 72 hours.  Studies/Results: Ct Head Wo Contrast  Result Date: 08/21/2018 CLINICAL DATA:  Left ventricular assist device patient. Lethargic. Worsened mental status. EXAM: CT HEAD WITHOUT CONTRAST TECHNIQUE: Contiguous axial images were obtained from the base of the skull through the vertex without intravenous contrast. COMPARISON:  03/20/2017 FINDINGS: Brain: Brainstem and cerebellum appear unremarkable. Right cerebral hemisphere shows old infarction in the right parietal lobe, which was acute in 2018. Cerebral hemispheres otherwise appear normal. No evidence of acute infarction, chronic small-vessel disease, mass, hemorrhage, hydrocephalus or extra-axial collection. Vascular: No abnormal vascular finding. Skull: Normal Sinuses/Orbits: Clear except for a retention cyst in the left division of the sphenoid sinus. Other: None IMPRESSION: No acute finding by CT. 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 Electronically Signed   By: Paulina Fusi M.D.   On: 08/21/2018 13:42   Dg Chest Portable 1 View  Result Date: 08/21/2018 CLINICAL DATA:  Severe shortness of breath today.  LVAD. EXAM: PORTABLE CHEST 1 VIEW COMPARISON:  09/18/2017 FINDINGS: There is chronic cardiomegaly with LVAD in place. There is slight new accentuation of the interstitial markings, probably representing mild interstitial edema. Pulmonary vascularity is  within normal limits. No discrete effusions or consolidative infiltrates. No acute bone abnormality. IMPRESSION: Slight bilateral pulmonary edema consistent with congestive heart failure. Chronic cardiomegaly. LVAD. Electronically Signed   By: Francene Boyers M.D.   On: 08/21/2018 12:23    ASSESMENT:   *     Acute, normocytic anemia.  Reports of dark/bloody stool.  FOBT +.  No previous EGD or colonoscopy.  On chronic daily PPI.  6.4 >> 7.3 after 1 U PRBC.  *    Constipation.   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  Chronic Coumadin.  INR 1.8.      *    Excessive somnolence due to large dose of Ambien.  Baseline insomnia.  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    *   OSA, on CPAP.  Still smoking.      PLAN   *  Enteroscopy and colonoscopy. Set for noon tomorrow.  Pt agreeable.       Jonathan Wood  08/22/2018, 8:29 AM Phone [phone]

## 2018-08-22 NOTE — Progress Notes (Signed)
LVAD Coordinator Rounding Note:  Admitted 08/21/2018 per Dr. Aundra Dubin due to shortness of breath, tachypnea, and altered mental status.   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 Darcey Nora under Destination Therapy criteria.  Patient laying in bed this morning. States that he does not remember most of the day yesterday. Reports he remembers "acting crazy at home and scaring his kids", and then waking up at the hospital early this morning. He is currently drinking his bowel prep for colonoscopy 08/23/2018 at noon.   Vital signs: Temp: 97.9 HR: 101 Doppler Pressure: 84 Automatic BP: 90/68 (76) O2 Sat: 99% on RA Wt:  238>230.6 lbs   LVAD interrogation reveals:  Speed: 5600 Flow: 5.0 Power:  4.2w PI: 3.1 Alarms: none Events: none Hematocrit: 33 Fixed speed: 5600 Low speed limit: 5300  Drive Line: Twice weekly dressing changes with daily kit. Bedside nurse may perform dressing changes. Next dressing change due 08/24/2018.   Labs:  LDH trend: 360> 364  INR trend: 2.0>1.8  Creatinine: 1.78>1.61  Anticoagulation Plan: -INR Goal: 2.0 - 2.5 -ASA Dose: 81 mg daily   Device: none  Blood Products:  08/21/18>> 1 PRBC 08/22/18>> 1 PRBC  Adverse Events on VAD: -  Plan/Recommendations:  1. Call VAD pager if any issues/questions with VAD equipment or drive line.  2. Twice a week dressing changes per bedside RN. Dressing kits and silver strips at bedside. Next dressing change due 08/24/18. 3. All 4 batteries at bedside belong to patient. He did not remember to bring his black back up bag, but did bring 2 extra batteries. Emergency equipment provided and at bedside.   Emerson Monte RN [place] Coordinator  Office: [phone]  24/7 Pager: [phone]

## 2018-08-23 ENCOUNTER — Encounter (HOSPITAL_COMMUNITY): Payer: Self-pay | Admitting: Certified Registered Nurse Anesthetist

## 2018-08-23 ENCOUNTER — Encounter (HOSPITAL_COMMUNITY)
Admission: EM | Disposition: A | Discharge status: Home / Self Care | Payer: Self-pay | Source: Home / Self Care | Attending: Cardiology

## 2018-08-23 ENCOUNTER — Inpatient Hospital Stay (HOSPITAL_COMMUNITY): Payer: Medicaid Other | Admitting: Anesthesiology

## 2018-08-23 ENCOUNTER — Inpatient Hospital Stay (HOSPITAL_COMMUNITY): Payer: Medicaid Other

## 2018-08-23 DIAGNOSIS — K269 Duodenal ulcer, unspecified as acute or chronic, without hemorrhage or perforation: Secondary | ICD-10-CM

## 2018-08-23 DIAGNOSIS — K921 Melena: Secondary | ICD-10-CM

## 2018-08-23 DIAGNOSIS — K635 Polyp of colon: Secondary | ICD-10-CM

## 2018-08-23 DIAGNOSIS — K228 Other specified diseases of esophagus: Secondary | ICD-10-CM

## 2018-08-23 HISTORY — PX: COLONOSCOPY: SHX5424

## 2018-08-23 HISTORY — PX: BIOPSY: SHX5522

## 2018-08-23 HISTORY — PX: ENTEROSCOPY: SHX5533

## 2018-08-23 HISTORY — PX: HEMOSTASIS CLIP PLACEMENT: SHX6857

## 2018-08-23 HISTORY — PX: POLYPECTOMY: SHX5525

## 2018-08-23 HISTORY — PX: SUBMUCOSAL TATTOO INJECTION: SHX6856

## 2018-08-23 LAB — BPAM RBC
Blood Product Expiration Date: 202004172359
Blood Product Expiration Date: 202004182359
ISSUE DATE / TIME: 202004061506
ISSUE DATE / TIME: 202004070947
Unit Type and Rh: 7300
Unit Type and Rh: 7300

## 2018-08-23 LAB — CBC
HCT: 27.7 % — ABNORMAL LOW (ref 39.0–52.0)
Hemoglobin: 8.6 g/dL — ABNORMAL LOW (ref 13.0–17.0)
MCH: 25.4 pg — ABNORMAL LOW (ref 26.0–34.0)
MCHC: 31 g/dL (ref 30.0–36.0)
MCV: 81.7 fL (ref 80.0–100.0)
Platelets: 213 10*3/uL (ref 150–400)
RBC: 3.39 MIL/uL — ABNORMAL LOW (ref 4.22–5.81)
RDW: 17.6 % — ABNORMAL HIGH (ref 11.5–15.5)
WBC: 10.1 10*3/uL (ref 4.0–10.5)
nRBC: 0.5 % — ABNORMAL HIGH (ref 0.0–0.2)

## 2018-08-23 LAB — TYPE AND SCREEN
ABO/RH(D): B POS
Antibody Screen: NEGATIVE
Unit division: 0
Unit division: 0

## 2018-08-23 LAB — PROTIME-INR
INR: 1.7 — ABNORMAL HIGH (ref 0.8–1.2)
Prothrombin Time: 20 seconds — ABNORMAL HIGH (ref 11.4–15.2)

## 2018-08-23 LAB — BASIC METABOLIC PANEL
Anion gap: 11 (ref 5–15)
BUN: 18 mg/dL (ref 6–20)
CO2: 21 mmol/L — ABNORMAL LOW (ref 22–32)
Calcium: 8.8 mg/dL — ABNORMAL LOW (ref 8.9–10.3)
Chloride: 98 mmol/L (ref 98–111)
Creatinine, Ser: 1.69 mg/dL — ABNORMAL HIGH (ref 0.61–1.24)
GFR calc Af Amer: 56 mL/min — ABNORMAL LOW (ref >60)
GFR calc non Af Amer: 48 mL/min — ABNORMAL LOW (ref >60)
Glucose, Bld: 92 mg/dL (ref 70–99)
Potassium: 3.5 mmol/L (ref 3.5–5.1)
Sodium: 130 mmol/L — ABNORMAL LOW (ref 135–145)

## 2018-08-23 LAB — LACTATE DEHYDROGENASE: LDH: 427 U/L — ABNORMAL HIGH (ref 98–192)

## 2018-08-23 SURGERY — ENTEROSCOPY
Anesthesia: General

## 2018-08-23 MED ORDER — HEPARIN (PORCINE) 25000 UT/250ML-% IV SOLN
1000.0000 [IU]/h | INTRAVENOUS | Status: DC
  Administered 2018-08-23 – 2018-08-27 (×2): 1000 [IU]/h via INTRAVENOUS
  Filled 2018-08-23 (×5): qty 250

## 2018-08-23 MED ORDER — FENTANYL CITRATE (PF) 250 MCG/5ML IJ SOLN
INTRAMUSCULAR | Status: DC | PRN
  Administered 2018-08-23 (×2): 50 ug via INTRAVENOUS

## 2018-08-23 MED ORDER — SPOT INK MARKER SYRINGE KIT
PACK | SUBMUCOSAL | Status: DC | PRN
  Administered 2018-08-23: 1.5 mL via SUBMUCOSAL

## 2018-08-23 MED ORDER — SODIUM CHLORIDE 0.9 % IV SOLN
INTRAVENOUS | Status: DC | PRN
  Administered 2018-08-23: 30 ug/min via INTRAVENOUS

## 2018-08-23 MED ORDER — LIDOCAINE 2% (20 MG/ML) 5 ML SYRINGE
INTRAMUSCULAR | Status: DC | PRN
  Administered 2018-08-23: 80 mg via INTRAVENOUS

## 2018-08-23 MED ORDER — PROPOFOL 10 MG/ML IV BOLUS
INTRAVENOUS | Status: DC | PRN
  Administered 2018-08-23: 100 mg via INTRAVENOUS

## 2018-08-23 MED ORDER — ROCURONIUM BROMIDE 10 MG/ML (PF) SYRINGE
PREFILLED_SYRINGE | INTRAVENOUS | Status: DC | PRN
  Administered 2018-08-23: 50 mg via INTRAVENOUS

## 2018-08-23 MED ORDER — SUGAMMADEX SODIUM 200 MG/2ML IV SOLN
INTRAVENOUS | Status: DC | PRN
  Administered 2018-08-23: 200 mg via INTRAVENOUS

## 2018-08-23 MED ORDER — SPOT INK MARKER SYRINGE KIT: PACK | SUBMUCOSAL | Status: AC

## 2018-08-23 MED ORDER — HYDRALAZINE HCL 50 MG PO TABS
25.0000 mg | ORAL_TABLET | Freq: Three times a day (TID) | ORAL | Status: DC
  Administered 2018-08-23 – 2018-08-25 (×6): 25 mg via ORAL
  Filled 2018-08-23 (×6): qty 1

## 2018-08-23 MED ORDER — LACTATED RINGERS IV SOLN
INTRAVENOUS | Status: DC
  Administered 2018-08-23 (×2): via INTRAVENOUS

## 2018-08-23 MED ORDER — MIDAZOLAM HCL 2 MG/2ML IJ SOLN
INTRAMUSCULAR | Status: DC | PRN
  Administered 2018-08-23: 2 mg via INTRAVENOUS

## 2018-08-23 MED ORDER — SUCCINYLCHOLINE CHLORIDE 200 MG/10ML IV SOSY
PREFILLED_SYRINGE | INTRAVENOUS | Status: DC | PRN
  Administered 2018-08-23: 100 mg via INTRAVENOUS

## 2018-08-23 MED ORDER — PHENYLEPHRINE 40 MCG/ML (10ML) SYRINGE FOR IV PUSH (FOR BLOOD PRESSURE SUPPORT)
PREFILLED_SYRINGE | INTRAVENOUS | Status: DC | PRN
  Administered 2018-08-23: 120 ug via INTRAVENOUS
  Administered 2018-08-23: 80 ug via INTRAVENOUS
  Administered 2018-08-23: 120 ug via INTRAVENOUS

## 2018-08-23 MED ORDER — POTASSIUM CHLORIDE CRYS ER 20 MEQ PO TBCR
40.0000 meq | EXTENDED_RELEASE_TABLET | Freq: Once | ORAL | Status: AC
  Administered 2018-08-23: 40 meq via ORAL
  Filled 2018-08-23: qty 2

## 2018-08-23 NOTE — Op Note (Signed)
[facility] Patient Name: Jonathan Wood Procedure Date : 08/23/2018 MRN: [id number] Attending MD: Justice Britain , MD Date of Birth: [birth year] CSN: [id number] Age: 48 Admit Type: Inpatient Procedure:                Small bowel enteroscopy Indications:              Iron deficiency anemia, Hematochezia Providers:                Justice Britain, MD, [name], RN, Carlyn Reichert, RN, William Dalton, Technician Referring MD:             Larey Dresser, MD Medicines:                General Anesthesia Complications:            No immediate complications. Estimated Blood Loss:     Estimated blood loss was minimal. Procedure:                Pre-Anesthesia Assessment:                           - Prior to the procedure, a History and Physical                            was performed, and patient medications and                            allergies were reviewed. The patient's tolerance of                            previous anesthesia was also reviewed. The risks                            and benefits of the procedure and the sedation                            options and risks were discussed with the patient.                            All questions were answered, and informed consent                            was obtained. Prior Anticoagulants: The patient has                            taken Coumadin (warfarin), last dose was 3 days                            prior to procedure. ASA Grade Assessment: III - A                            patient with severe systemic disease. After  reviewing the risks and benefits, the patient was                            deemed in satisfactory condition to undergo the                            procedure.                           After obtaining informed consent, the endoscope was                            passed under direct vision. Throughout the       procedure, the patient's blood pressure, pulse, and                            oxygen saturations were monitored continuously. The                            [id number]-[id number] ([id number] Olympus pediatric colonscope                            was introduced through the mouth and advanced to                            the proximal jejunum. The small bowel enteroscopy                            was accomplished without difficulty. The patient                            tolerated the procedure. Scope In: Scope Out: Findings:      No gross lesions were noted in the proximal esophagus, in the mid       esophagus and in the distal esophagus.      The Z-line was irregular and was found 40 cm from the incisors.      A small hiatal hernia was present.      Striped moderately erythematous mucosa without bleeding was found in the       gastric antrum.      No other gross lesions were noted in the entire examined stomach.       Biopsies were taken with a cold forceps for histology and Helicobacter       pylori testing from the antrum/incisura/greater curve/lesser curve.      A few non-bleeding superficial duodenal ulcers and erosions with a clean       base (Forrest Class III) were found in the duodenal bulb. The largest       lesion was 5 mm in largest dimension.      Normal mucosa was found in the second portion of the duodenum, in the       third portion of the duodenum and in the fourth portion of the duodenum.      Normal mucosa was found in the jejunum. Area was tattooed with an       injection of Spot (carbon black) to demarcate the distal extent of SBE. Impression:               -  No gross lesions in esophagus.                           - Z-line irregular, 40 cm from the incisors.                           - Small hiatal hernia.                           - Erythematous mucosa in the antrum. No other gross                            lesions in the stomach. Biopsied for HP.                            - Non-bleeding duodenal ulcers and erosions with a                            clean base (Forrest Class III).                           - Normal mucosa was found in the second portion of                            the duodenum, in the third portion of the duodenum                            and in the fourth portion of the duodenum.                           - Normal mucosa was found in the jejunum. Tattooed                            distal extent. Recommendation:           - The patient will be observed post-procedure,                            until all discharge criteria are met.                           - Proceed to scheduled colonoscopy.                           - Await pathology results.                           - Increase PPI to BID dosing for next 35-month.                           - Minimize other NSAIDs.                           - The findings and recommendations were discussed  with the patient.                           - The findings and recommendations were discussed                            with the referring physician. Procedure Code(s):        --- Professional ---                           [phone], 66, Small intestinal endoscopy, enteroscopy                            beyond second portion of duodenum, not including                            ileum; with biopsy, single or multiple                           44799, Unlisted procedure, small intestine Diagnosis Code(s):        --- Professional ---                           K22.8, Other specified diseases of esophagus                           K44.9, Diaphragmatic hernia without obstruction or                            gangrene                           K31.89, Other diseases of stomach and duodenum                           K26.9, Duodenal ulcer, unspecified as acute or                            chronic, without hemorrhage or perforation                           D50.9, Iron deficiency  anemia, unspecified                           K92.1, Melena (includes Hematochezia) CPT copyright 2019 [employer]. All rights reserved. The codes documented in this report are preliminary and upon coder review may  be revised to meet current compliance requirements. Justice Britain, MD 08/23/2018 2:55:06 PM Number of Addenda: 0

## 2018-08-23 NOTE — Progress Notes (Signed)
Patient ID: Jonathan Wood, male   DOB: 1970/07/25, 48 y.o.   MRN: [id number]   Advanced Heart Failure VAD Team Note  PCP-Cardiologist: Arvilla Meres, MD   Subjective:    Patient got 1 unit PRBCs again yesterday for a total of 2 units, hgb 6.4 (admission) => 8.6 today.  FOBT+.  He did not have a bowel movement over the last day.  Seen by GI, plan for enteroscopy/colonoscopy tomorrow.  INR 1.8 today. LDH 360=>364=>427. MAP higher now in 90s-100.   He diuresed well again with IV Lasix, weight down 2 lbs.  Creatinine stable at 1.69.  Breathing is getting better but not at his baseline.  Cough resolved.   LVAD INTERROGATION:  HeartMate 3 LVAD:   Flow 4.7 liters/min, speed 5600, power 4.2, PI 4.2.  1 PI event.    Objective:    Vital Signs:   Temp:  [97.7 F (36.5 C)-97.9 F (36.6 C)] 97.7 F (36.5 C) (04/08 0817) Pulse Rate:  [57-107] 105 (04/08 0000) Resp:  [22] 22 (04/07 1158) BP: (90-135)/(68-113) 117/107 (04/08 0903) SpO2:  [98 %-100 %] 100 % (04/08 0149) FiO2 (%):  [21 %] 21 % (04/08 0149) Weight:  [103.5 kg] 103.5 kg (04/08 0500) Last BM Date: 08/23/18 Mean arterial Pressure 90s  Intake/Output:   Intake/Output Summary (Last 24 hours) at 08/23/2018 1016 Last data filed at 08/23/2018 0937 Gross per 24 hour  Intake 317 ml  Output 2000 ml  Net -1683 ml     Physical Exam    General: Well appearing this am. NAD.  HEENT: Normal. Neck: Supple, JVP 12 cm. Carotids OK.  Cardiac:  Mechanical heart sounds with LVAD hum present.  Lungs:  CTAB, normal effort.  Abdomen:  NT, ND, no HSM. No bruits or masses. +BS  LVAD exit site: Well-healed and incorporated. Dressing dry and intact. No erythema or drainage. Stabilization device present and accurately applied. Driveline dressing changed daily per sterile technique. Extremities:  Warm and dry. No cyanosis, clubbing, rash, or edema.  Neuro:  Alert & oriented x 3. Cranial nerves grossly intact. Moves all 4 extremities w/o  difficulty. Affect pleasant     Telemetry   NSR 90s (personally reviewed)   Labs   Basic Metabolic Panel: Recent Labs  Lab 08/21/18 1210 08/22/18 0412 08/23/18 0229  NA 135 136 130*  K 4.4 3.7 3.5  CL 102 101 98  CO2 21* 27 21*  GLUCOSE 139* 117* 92  BUN 25* 21* 18  CREATININE 1.78* 1.61* 1.69*  CALCIUM 9.1 8.8* 8.8*    Liver Function Tests: Recent Labs  Lab 08/21/18 1210  AST 40  ALT 25  ALKPHOS 70  BILITOT 1.0  PROT 8.0  ALBUMIN 3.9   No results for input(s): LIPASE, AMYLASE in the last 168 hours. No results for input(s): AMMONIA in the last 168 hours.  CBC: Recent Labs  Lab 08/21/18 1210 08/21/18 1944 08/22/18 0412 08/23/18 0229  WBC 10.2 8.3 7.4 10.1  HGB 6.4* 7.4* 7.3* 8.6*  HCT 23.3* 24.2* 23.9* 27.7*  MCV 83.8 82.0 81.0 81.7  PLT 191 171 189 213    INR: Recent Labs  Lab 08/17/18 1454 08/21/18 1210 08/22/18 0412 08/23/18 0229  INR 1.8* 2.0* 1.8* 1.7*    Other results:  EKG:    Imaging   Ct Head Wo Contrast  Result Date: 08/21/2018 CLINICAL DATA:  Left ventricular assist device patient. Lethargic. Worsened mental status. EXAM: CT HEAD WITHOUT CONTRAST TECHNIQUE: Contiguous axial images were obtained from the  base of the skull through the vertex without intravenous contrast. COMPARISON:  03/20/2017 FINDINGS: Brain: Brainstem and cerebellum appear unremarkable. Right cerebral hemisphere shows old infarction in the right parietal lobe, which was acute in 2018. Cerebral hemispheres otherwise appear normal. No evidence of acute infarction, chronic small-vessel disease, mass, hemorrhage, hydrocephalus or extra-axial collection. Vascular: No abnormal vascular finding. Skull: Normal Sinuses/Orbits: Clear except for a retention cyst in the left division of the sphenoid sinus. Other: None IMPRESSION: No acute finding by CT. 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 Electronically Signed    By: Paulina Fusi M.D.   On: 08/21/2018 13:42   Dg Chest Portable 1 View  Result Date: 08/21/2018 CLINICAL DATA:  Severe shortness of breath today.  LVAD. EXAM: PORTABLE CHEST 1 VIEW COMPARISON:  09/18/2017 FINDINGS: There is chronic cardiomegaly with LVAD in place. There is slight new accentuation of the interstitial markings, probably representing mild interstitial edema. Pulmonary vascularity is within normal limits. No discrete effusions or consolidative infiltrates. No acute bone abnormality. IMPRESSION: Slight bilateral pulmonary edema consistent with congestive heart failure. Chronic cardiomegaly. LVAD. Electronically Signed   By: Francene Boyers M.D.   On: 08/21/2018 12:23     Medications:     Scheduled Medications:  sodium chloride   Intravenous Once   amiodarone  200 mg Oral Daily   citalopram  10 mg Oral Daily   docusate sodium  100 mg Oral BID   furosemide  80 mg Intravenous BID   gabapentin  300 mg Oral TID   hydrALAZINE  25 mg Oral Q8H   pantoprazole  40 mg Oral Daily   senna  2 tablet Oral Daily   sildenafil  20 mg Oral TID    Infusions:  sodium chloride      PRN Medications: acetaminophen, ondansetron (ZOFRAN) IV    Assessment/Plan:    1. 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 He is on sildenafil for RV failure.  LVAD parameters are stable.  He was volume overloaded at admission, likely related to a degree of RV failure.  Good diuresis yesterday with weight down 2 lbs, creatinine stable 1.78 => 1.6 => 1.69.  Still volume overloaded on exam though improving.  MAP now rising, in 90s generally.  LDH has risen which is concerning (427 today) though LVAD parameters remain stable.  INR 1.7.  - Lasix 80 mg IV bid today again, follow I/Os and creatinine. Replace K.  - Continue sildenafil 20 mg tid.  Sherryll Burger on hold with elevated creatinine.  For now, will start hydralazine 25 mg tid and titrate up as needed, hopefully  back to [facility] prior to discharge.   - To get scopes today, unless GI asks otherwise, will restart coumadin + heparin gtt after procedure.  2. AKI: In setting of anemia and acute on chronic systolic CHF.  Creatinine stable today with diuresis and blood.  3. GI bleeding: Patient has had 2 wks of episodes of both hematochezia and melena by his description.  Hgb 6.4 on admission, was 13.1 2 months ago.  2 units PRBCs 4/6 and 4/7, hgb 8.6.  - Plan for enteroscopy/colonoscopy today around noon.  - As above, unless GI requests otherwise, will restart warfarin after procedure for goal INR 2-2.5 and will bridge with heparin gtt.  Will leave off aspirin for the future given significant GI bleeding.    4. Altered mental status: Resolved, likely due  to Ambien 20 mg.  CT head showed no acute change.  5. OSA: CPAP at night.  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   I reviewed the LVAD parameters from today, and compared the results to the patient's prior recorded data.  No programming changes were made.  The LVAD is functioning within specified parameters.  The patient performs LVAD self-test daily.  LVAD interrogation was negative for any significant power changes, alarms or PI events/speed drops.  LVAD equipment check completed and is in good working order.  Back-up equipment present.   LVAD education done on emergency procedures and precautions and reviewed exit site care.  Length of Stay: 2  Marca Ancona, MD 08/23/2018, 10:16 AM  VAD Team --- VAD ISSUES ONLY--- Pager [phone] (7am - 7am)  Advanced Heart Failure Team  Pager [phone] (M-F; 7a - 4p)  Please contact [facility] for night-coverage after hours (4p -7a ) and weekends on [email or web address]

## 2018-08-23 NOTE — Progress Notes (Signed)
ANTICOAGULATION CONSULT NOTE - Follow Up Consult  Pharmacy Consult for warfarin Indication: LVAD  No Known Allergies  Patient Measurements: Height: 6\' 3"  (190.5 cm) Weight: 228 lb 2.8 oz (103.5 kg)(standing ) IBW/kg (Calculated) : 84.5  Vital Signs: Temp: 97.8 F (36.6 C) (04/08 1428) Temp Source: Oral (04/08 1428) BP: 119/101 (04/08 1510) Pulse Rate: 95 (04/08 1500)  Labs: Recent Labs    08/21/18 1210 08/21/18 1944 08/22/18 0412 08/23/18 0229  HGB 6.4* 7.4* 7.3* 8.6*  HCT 23.3* 24.2* 23.9* 27.7*  PLT 191 171 189 213  LABPROT 22.4*  --  20.6* 20.0*  INR 2.0*  --  1.8* 1.7*  CREATININE 1.78*  --  1.61* 1.69*    Estimated Creatinine Clearance: 71.9 mL/min (A) (by C-G formula based on SCr of 1.69 mg/dL (H)).   Medical History: Past Medical History:  Diagnosis Date  . Asthma   . CHF (congestive heart failure) (HCC)    a. 09/2016: EF 20-25% with cath showing normal cors  . GERD (gastroesophageal reflux disease)   . History of hiatal hernia    Assessment: 47 yom with LVAD on warfarin PTA presenting with SOB, AMS. Hgb initially 6.4> 8.6 improved after 2 uts PRBC. INR 2 on admission  - holding warfarin for GI w/u S/p 4 polyps removed - continue to hold warfarin overnight - restart heparin 6hr post procedure  - low dose with bleeding risk. Slight bump in LDH 360>427 - watch closely    Goal of Therapy:  INR goal 2-2.5 Monitor platelets by anticoagulation protocol: Yes   Plan:  Hold warfarin today Start heparin drip 1000 uts/hr tonight  (10uts/kg)  AML - HL, CBC, protime  [name] Pharm.D. CPP, BCPS Clinical Pharmacist [phone] 08/23/2018 3:25 PM

## 2018-08-23 NOTE — Op Note (Signed)
Operating Room Services Patient Name: Jonathan Wood Procedure Date : 08/23/2018 MRN: [id number] Attending MD: Justice Britain , MD Date of Birth: May 09, 1971 CSN: [id number] Age: 48 Admit Type: Inpatient Procedure:                Colonoscopy Indications:              Hematochezia Providers:                Justice Britain, MD, [name], RN, Carlyn Reichert, RN, William Dalton, Technician Referring MD:             Larey Dresser, MD Medicines:                General Anesthesia Complications:            No immediate complications. Estimated Blood Loss:     Estimated blood loss was minimal. Procedure:                Pre-Anesthesia Assessment:                           - Prior to the procedure, a History and Physical                            was performed, and patient medications and                            allergies were reviewed. The patient's tolerance of                            previous anesthesia was also reviewed. The risks                            and benefits of the procedure and the sedation                            options and risks were discussed with the patient.                            All questions were answered, and informed consent                            was obtained. Prior Anticoagulants: The patient has                            taken Coumadin (warfarin), last dose was 3 days                            prior to procedure. ASA Grade Assessment: III - A                            patient with severe systemic disease. After  reviewing the risks and benefits, the patient was                            deemed in satisfactory condition to undergo the                            procedure.                           - Prior to the procedure, a History and Physical                            was performed, and patient medications and                            allergies were reviewed. The  patient's tolerance of                            previous anesthesia was also reviewed. The risks                            and benefits of the procedure and the sedation                            options and risks were discussed with the patient.                            All questions were answered, and informed consent                            was obtained. Prior Anticoagulants: The patient has                            taken Coumadin (warfarin), last dose was 3 days                            prior to procedure. ASA Grade Assessment: III - A                            patient with severe systemic disease. After                            reviewing the risks and benefits, the patient was                            deemed in satisfactory condition to undergo the                            procedure.                           After obtaining informed consent, the colonoscope  was passed under direct vision. Throughout the                            procedure, the patient's blood pressure, pulse, and                            oxygen saturations were monitored continuously. The                            PCF-H190DL ([id number] Olympus pediatric colonscope                            was introduced through the anus and advanced to the                            the cecum, identified by appendiceal orifice and                            ileocecal valve. The colonoscopy was performed                            without difficulty. The patient tolerated the                            procedure. The quality of the bowel preparation was                            adequate. The ileocecal valve, appendiceal orifice,                            and rectum were photographed. Scope In: 1:36:27 PM Scope Out: 2:13:03 PM Scope Withdrawal Time: 0 hours 30 minutes 55 seconds  Total Procedure Duration: 0 hours 36 minutes 36 seconds  Findings:      The digital rectal exam  findings include non-thrombosed external       hemorrhoids. Pertinent negatives include no palpable rectal lesions.      Five sessile polyps were found in the recto-sigmoid colon (4) and       descending colon (1). The polyps were 2 to 6 mm in size. These polyps       were removed with a cold snare. Resection and retrieval were complete.       To stop active oozing that persisted after 5 minutes of watching at the       DC polyp site, two hemostatic clips were successfully placed (MR       conditional). There was no bleeding at the end of the procedure.      Normal mucosa was found in the entire colon otherwise.      Non-bleeding non-thrombosed external and internal hemorrhoids were found       during retroflexion, during perianal exam and during digital exam. The       hemorrhoids were large and Grade I (internal hemorrhoids that do not       prolapse). Impression:               - Non-thrombosed external hemorrhoids found on  digital rectal exam.                           - Five 2 to 6 mm polyps at the recto-sigmoid colon                            and in the descending colon, removed with a cold                            snare. Resected and retrieved. Clip (MR                            conditional) was placed.                           - Normal mucosa in the entire examined colon                            otherwise.                           - Non-bleeding non-thrombosed external and internal                            hemorrhoids. Recommendation:           - The patient will be observed post-procedure,                            until all discharge criteria are met.                           - Return patient to hospital ward for ongoing care.                           - Heart healthy diet.                           - Ideally would hold restart of Coumadin for 48                            hours, but if cannot wait this long, then would                             start with IV heparin in 6-hours without bolus in                            effort to decrease risk of post-polypectomy                            bleeding and post-biopsy bleeding from today's                            procedures.                           -  Await pathology results.                           - Repeat colonoscopy in 3/5/7 years for                            surveillance based on pathology results and                            findings of adenomatous tissue.                           - Colace capsule(s) orally 100 mg BID.                           - Use hydrocortisone suppository 25 mg 1 per rectum                            once a day for next 2-weeks at bedtime.                           - Preparation H cream: Apply externally BID.                           - Sitz bathes twice daily.                           - If patient continues to have issues with                            recurrent bleeding, will have patient referred to                            one of my partners who may be able to offer                            internal hemorhoidal banding.                           - The findings and recommendations were discussed                            with the patient.                           - The findings and recommendations were discussed                            with the referring physician. Procedure Code(s):        --- Professional ---                           [phone], 51, Colonoscopy, flexible; with removal of  tumor(s), polyp(s), or other lesion(s) by snare                            technique Diagnosis Code(s):        --- Professional ---                           K63.5, Polyp of colon                           K64.0, First degree hemorrhoids                           K64.4, Residual hemorrhoidal skin tags                           K92.1, Melena (includes Hematochezia) CPT copyright 2019 American Medical Association.  All rights reserved. The codes documented in this report are preliminary and upon coder review may  be revised to meet current compliance requirements. Justice Britain, MD 08/23/2018 3:06:05 PM Number of Addenda: 0

## 2018-08-23 NOTE — Interval H&P Note (Signed)
History and Physical Interval Note:  08/23/2018 12:35 PM  Jonathan Wood  has presented today for surgery, with the diagnosis of Blood Loss Anemia, rectal bleeding, LVAD.  The various methods of treatment have been discussed with the patient and family. After consideration of risks, benefits and other options for treatment, the patient has consented to  Procedure(s): ENTEROSCOPY (N/A) COLONOSCOPY (N/A) as a surgical intervention.  The patient's history has been reviewed, patient examined, no change in status, stable for surgery.  I have reviewed the patient's chart and labs.  Questions were answered to the patient's satisfaction.     [employer]

## 2018-08-23 NOTE — Anesthesia Preprocedure Evaluation (Addendum)
Anesthesia Evaluation  Patient identified by MRN, date of birth, ID band Patient awake    Reviewed: Allergy & Precautions, NPO status , Patient's Chart, lab work & pertinent test results  Airway Mallampati: III  TM Distance: >3 FB Neck ROM: Full    Dental no notable dental hx.    Pulmonary asthma , sleep apnea and Continuous Positive Airway Pressure Ventilation , Current Smoker,    Pulmonary exam normal        Cardiovascular +CHF  Normal cardiovascular exam  LVAD   Impressions: Limited RAMP study for LAVAD. Cannula in apex with no obstruction EF 15% with abnormal septal motion Mild MR and mild AR. Pump speed 5400 had the lowes ESD of 5.05 cm No pericardial effusion Abnormal septal motion with mild RV dilatation   Neuro/Psych Anxiety negative neurological ROS     GI/Hepatic Neg liver ROS, hiatal hernia, GERD  Medicated and Controlled,  Endo/Other  negative endocrine ROS  Renal/GU Renal InsufficiencyRenal disease     Musculoskeletal negative musculoskeletal ROS (+)   Abdominal   Peds  Hematology  (+) anemia ,   Anesthesia Other Findings Blood Loss Anemia, rectal bleeding, LVAD  Reproductive/Obstetrics                            Anesthesia Physical Anesthesia Plan  ASA: IV  Anesthesia Plan: General   Post-op Pain Management:    Induction: Intravenous  PONV Risk Score and Plan: 1 and Treatment may vary due to age or medical condition, Ondansetron and Dexamethasone  Airway Management Planned: Oral ETT  Additional Equipment:   Intra-op Plan:   Post-operative Plan: Extubation in OR  Informed Consent: I have reviewed the patients History and Physical, chart, labs and discussed the procedure including the risks, benefits and alternatives for the proposed anesthesia with the patient or authorized representative who has indicated his/her understanding and acceptance.     Dental  advisory given  Plan Discussed with: CRNA  Anesthesia Plan Comments:        Anesthesia Quick Evaluation

## 2018-08-23 NOTE — Progress Notes (Signed)
RT offered pt CPAP dream station for the night and pt declined stating he did not need it tonight. RT will continue to monitor.

## 2018-08-23 NOTE — Anesthesia Procedure Notes (Signed)
Procedure Name: Intubation Date/Time: 08/23/2018 1:03 PM Performed by: Janora Norlander, CRNA Pre-anesthesia Checklist: Patient identified, Emergency Drugs available, Suction available and Patient being monitored Patient Re-evaluated:Patient Re-evaluated prior to induction Oxygen Delivery Method: Circle System Utilized Preoxygenation: Pre-oxygenation with 100% oxygen Induction Type: IV induction and Rapid sequence Laryngoscope Size: Glidescope and 4 Grade View: Grade I Tube type: Oral Tube size: 7.5 mm Number of attempts: 1 Airway Equipment and Method: Stylet and Oral airway Placement Confirmation: ETT inserted through vocal cords under direct vision,  positive ETCO2 and breath sounds checked- equal and bilateral Secured at: 23 cm Tube secured with: Tape Dental Injury: Teeth and Oropharynx as per pre-operative assessment

## 2018-08-23 NOTE — Progress Notes (Signed)
VAD Coordinator Procedure Note:   VAD Coordinator met patient in 2H19. Pt undergoing EGD and colonoscopy  per Dr.Mansouraty . Hemodynamics and VAD parameters monitored by myself and anesthesia throughout the procedure. Blood pressures were obtained with automatic cuff on left arm.    Time: Doppler Auto  BP Flow PI Power Speed  Pre-procedure:  11:30  126/105 (110) 4.9 3.3 4.3 5600   12:30  111/80 (90) 4.9 3.9 4.3            Secation Induction: 12:50  101/81 (89) 4.9 3.7 4.2    13:00  95/68 (75) 4.9 3.6 4.1    13:10  100/71 (81) 5.5 2.1 4.2    13:20  108/85 (94) 5.0 2.4 4.1    13:30  115/88 (98) 4.8 2.6 4.1    13:40  123/103 (111) 4.3 4.4 4.1    13:50  112/100 (108) 4.8 2.4 4.1    14:00  98/80 (90) 4.9 2.3 4.5            Recovery Area: 14:20  141/122 (127) 4.9 3.2 4.4    14:30  120/97 (102) 4.9 3.4 4.3     Patient tolerated the procedure well. VAD Coordinator accompanied and remained with patient in recovery area until [place] relieved me. She will remain with patient and accompany him back to his room.  Asked Dr. Rush Landmark to call patient's mother; her contact info was given to him and he verbalized agreement to do the same.   Plan: Re-start Heparin 6 hrs after procedure; will wait to re-start coumadin after reviewing INR tomorrow per discussion between Dr. Aundra Dubin and Dr. Rush Landmark. Bonnita Nasuti PharmD aware and will place orders. She has already spoken with 2H bedside nurse regarding plan.    Zada Girt RN, VAD Coordinator 24/7 VAD pager: [phone]

## 2018-08-23 NOTE — Anesthesia Postprocedure Evaluation (Signed)
Anesthesia Post Note  Patient: AMON DUYCK  Procedure(s) Performed: ENTEROSCOPY (N/A ) COLONOSCOPY (N/A ) SCLEROTHERAPY BIOPSY POLYPECTOMY     Patient location during evaluation: PACU Anesthesia Type: General Level of consciousness: awake Pain management: pain level controlled Vital Signs Assessment: post-procedure vital signs reviewed and stable Respiratory status: spontaneous breathing, nonlabored ventilation, respiratory function stable and patient connected to nasal cannula oxygen Cardiovascular status: blood pressure returned to baseline and stable Postop Assessment: no apparent nausea or vomiting Anesthetic complications: no    Last Vitals:  Vitals:   08/23/18 1600 08/23/18 1741  BP: (!) 112/94   Pulse: 98 95  Resp:    Temp:    SpO2: (!) 89% (!) 86%    Last Pain:  Vitals:   08/23/18 1557  TempSrc: Oral  PainSc:                  Orval Dortch P Thos Matsumoto

## 2018-08-23 NOTE — Transfer of Care (Signed)
Immediate Anesthesia Transfer of Care Note  Patient: Jonathan Wood  Procedure(s) Performed: ENTEROSCOPY (N/A ) COLONOSCOPY (N/A ) SCLEROTHERAPY BIOPSY POLYPECTOMY  Patient Location: PACU  Anesthesia Type:General  Level of Consciousness: awake, alert  and patient cooperative  Airway & Oxygen Therapy: Patient Spontanous Breathing  Post-op Assessment: Report given to RN and Post -op Vital signs reviewed and stable  Post vital signs: Reviewed and stable  Last Vitals:  Vitals Value Taken Time  BP 141/122 08/23/2018  2:26 PM  Temp    Pulse 103 08/23/2018  2:28 PM  Resp 29 08/23/2018  2:28 PM  SpO2 94 % 08/23/2018  2:28 PM  Vitals shown include unvalidated device data.  Last Pain:  Vitals:   08/23/18 1135  TempSrc: Oral  PainSc: 0-No pain      Patients Stated Pain Goal: 0 (08/22/18 0930)  Complications: No apparent anesthesia complications

## 2018-08-23 NOTE — Progress Notes (Signed)
LVAD Coordinator Rounding Note:  Admitted 08/21/2018 per Dr. Aundra Dubin due to shortness of breath, tachypnea, and altered mental status.   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 Darcey Nora under Destination Therapy criteria.  VAD Coordinator paged to patient's room to accompany patient to Endo suite for scheduled EGD and colonoscopy today.  Pt awake, walked to W/C without assistance, reports he feels "fine" and is "ready to get this over with".   Vital signs: Temp: 98.0 HR: 106 Doppler Pressure:  102 Automatic BP: 117/107 (111) Sat: 98% on RA Wt:  238>230.6>228 lbs   LVAD interrogation reveals:  Speed: 5600 Flow: 4.8 Power:  4.4w PI: 3.3 Alarms: none Events: none Hematocrit: 28 Fixed speed: 5600 Low speed limit: 5300  Drive Line: Twice weekly dressing changes with daily kit. Bedside nurse may perform dressing changes. Next dressing change due 08/24/2018.   Labs:  LDH trend: 360> 364>427  INR trend: 2.0>1.8>1.7  Creatinine: 1.78>1.61>1.69  Anticoagulation Plan: -INR Goal: 2.0 - 2.5 -ASA Dose: 81 mg daily   Device: none  Blood Products:  08/21/18>> 1 PRBC 08/22/18>> 1 PRBC  Adverse Events on VAD: -  Plan/Recommendations:  1. Call VAD pager if any issues/questions with VAD equipment or drive line.  2. Twice a week dressing changes per bedside RN. Dressing kits and silver strips at bedside. Next dressing change due 08/24/18. 3. All 4 batteries at bedside belong to patient. He did not remember to bring his black back up bag, but did bring 2 extra batteries. Emergency equipment provided and at bedside.   Zada Girt RN [place] Coordinator  Office: [phone]  24/7 Pager: [phone]

## 2018-08-23 NOTE — Progress Notes (Signed)
RT placed pt on CPAP dream station for the night. Pt on auto titrate 20 High 6 low with no oxygen bled into the system. RT will continue to monitor.

## 2018-08-24 ENCOUNTER — Other Ambulatory Visit: Payer: Self-pay

## 2018-08-24 ENCOUNTER — Telehealth: Payer: Self-pay

## 2018-08-24 ENCOUNTER — Encounter (HOSPITAL_COMMUNITY): Payer: Self-pay | Admitting: Gastroenterology

## 2018-08-24 DIAGNOSIS — K649 Unspecified hemorrhoids: Secondary | ICD-10-CM

## 2018-08-24 DIAGNOSIS — K259 Gastric ulcer, unspecified as acute or chronic, without hemorrhage or perforation: Secondary | ICD-10-CM

## 2018-08-24 DIAGNOSIS — Z7901 Long term (current) use of anticoagulants: Secondary | ICD-10-CM

## 2018-08-24 LAB — CBC
HCT: 27.8 % — ABNORMAL LOW (ref 39.0–52.0)
Hemoglobin: 8.6 g/dL — ABNORMAL LOW (ref 13.0–17.0)
MCH: 25.7 pg — ABNORMAL LOW (ref 26.0–34.0)
MCHC: 30.9 g/dL (ref 30.0–36.0)
MCV: 83 fL (ref 80.0–100.0)
Platelets: 224 10*3/uL (ref 150–400)
RBC: 3.35 MIL/uL — ABNORMAL LOW (ref 4.22–5.81)
RDW: 17.9 % — ABNORMAL HIGH (ref 11.5–15.5)
WBC: 10.2 10*3/uL (ref 4.0–10.5)
nRBC: 0.4 % — ABNORMAL HIGH (ref 0.0–0.2)

## 2018-08-24 LAB — LACTATE DEHYDROGENASE: LDH: 400 U/L — ABNORMAL HIGH (ref 98–192)

## 2018-08-24 LAB — BASIC METABOLIC PANEL
Anion gap: 11 (ref 5–15)
BUN: 21 mg/dL — ABNORMAL HIGH (ref 6–20)
CO2: 25 mmol/L (ref 22–32)
Calcium: 8.8 mg/dL — ABNORMAL LOW (ref 8.9–10.3)
Chloride: 98 mmol/L (ref 98–111)
Creatinine, Ser: 1.76 mg/dL — ABNORMAL HIGH (ref 0.61–1.24)
GFR calc Af Amer: 53 mL/min — ABNORMAL LOW (ref >60)
GFR calc non Af Amer: 46 mL/min — ABNORMAL LOW (ref >60)
Glucose, Bld: 92 mg/dL (ref 70–99)
Potassium: 4.1 mmol/L (ref 3.5–5.1)
Sodium: 134 mmol/L — ABNORMAL LOW (ref 135–145)

## 2018-08-24 LAB — PROTIME-INR
INR: 1.5 — ABNORMAL HIGH (ref 0.8–1.2)
Prothrombin Time: 17.6 seconds — ABNORMAL HIGH (ref 11.4–15.2)

## 2018-08-24 LAB — HEPARIN LEVEL (UNFRACTIONATED): Heparin Unfractionated: <0.1 IU/mL — ABNORMAL LOW (ref 0.30–0.70)

## 2018-08-24 LAB — MAGNESIUM: Magnesium: 2.1 mg/dL (ref 1.7–2.4)

## 2018-08-24 MED ORDER — DOCUSATE SODIUM 100 MG PO CAPS: 100.0000 mg | ORAL_CAPSULE | Freq: Two times a day (BID) | ORAL | Status: DC

## 2018-08-24 MED ORDER — HYDROCORTISONE 1 % EX CREA
TOPICAL_CREAM | Freq: Two times a day (BID) | CUTANEOUS | Status: DC
  Administered 2018-08-24: 22:00:00 via TOPICAL
  Filled 2018-08-24: qty 28

## 2018-08-24 MED ORDER — WARFARIN SODIUM 5 MG PO TABS
5.0000 mg | ORAL_TABLET | Freq: Once | ORAL | Status: AC
  Administered 2018-08-24: 5 mg via ORAL
  Filled 2018-08-24: qty 1

## 2018-08-24 MED ORDER — HYDROCORTISONE ACETATE 25 MG RE SUPP
25.0000 mg | Freq: Every day | RECTAL | Status: DC
  Filled 2018-08-24 (×4): qty 1

## 2018-08-24 MED ORDER — POTASSIUM CHLORIDE CRYS ER 20 MEQ PO TBCR
40.0000 meq | EXTENDED_RELEASE_TABLET | Freq: Once | ORAL | Status: AC
  Administered 2018-08-24: 40 meq via ORAL
  Filled 2018-08-24: qty 2

## 2018-08-24 MED ORDER — WARFARIN - PHARMACIST DOSING INPATIENT: Freq: Every day | Status: DC

## 2018-08-24 MED ORDER — PANTOPRAZOLE SODIUM 40 MG PO TBEC
40.0000 mg | DELAYED_RELEASE_TABLET | Freq: Two times a day (BID) | ORAL | Status: DC
  Administered 2018-08-24 – 2018-08-28 (×9): 40 mg via ORAL
  Filled 2018-08-24 (×9): qty 1

## 2018-08-24 NOTE — Progress Notes (Signed)
Gastroenterology Inpatient Follow-up Note   PATIENT IDENTIFICATION  Jonathan Wood is a 48 y.o. male with a pmh significant for CHF (status post LVAD on anticoagulation), GERD, hypertension, hyperlipidemia, tobacco use, hemorrhoids, colon polyps. Hospital Day: 4  SUBJECTIVE  The patient describes no significant recurrent GI bleeding in the setting or manifestation of hematochezia or melena. He feels well. He has been initiated on heparin. The patient wants to go home.  OBJECTIVE  Scheduled Inpatient Medications:  . sodium chloride   Intravenous Once  . amiodarone  200 mg Oral Daily  . citalopram  10 mg Oral Daily  . docusate sodium  100 mg Oral BID  . furosemide  80 mg Intravenous BID  . gabapentin  300 mg Oral TID  . hydrALAZINE  25 mg Oral Q8H  . hydrocortisone  25 mg Rectal QHS  . hydrocortisone cream   Topical BID  . pantoprazole  40 mg Oral BID  . senna  2 tablet Oral Daily  . sildenafil  20 mg Oral TID  . warfarin  5 mg Oral ONCE-1800  . Warfarin - Pharmacist Dosing Inpatient   Does not apply q1800   Continuous Inpatient Infusions:  . sodium chloride    . heparin 1,000 Units/hr (08/24/18 1300)   PRN Inpatient Medications: acetaminophen, ondansetron (ZOFRAN) IV   Physical Examination  Temp:  [97.8 F (36.6 C)-98.7 F (37.1 C)] 98 F (36.7 C) (04/09 1125) Pulse Rate:  [89-125] 89 (04/09 1400) Resp:  [18] 18 (04/08 2000) BP: (81-119)/(42-103) 106/94 (04/09 1400) SpO2:  [86 %-100 %] 99 % (04/09 1306) Weight:  [102.4 kg] 102.4 kg (04/09 0600) Temp (24hrs), Avg:98.1 F (36.7 C), Min:97.8 F (36.6 C), Max:98.7 F (37.1 C)  Weight: 102.4 kg GEN: NAD, resting comfortably in bed PSYCH: Cooperative EYE: Conjunctivae pink CV: LVAD sounds present PULM: No adventitious sounds present GI: NABS, soft, protuberant, without rebound or guarding MSK/EXT: Bilateral pedal edema present SKIN: No jaundices NEURO:  Alert & Oriented x 3, no focal deficits   Review of  Data   Laboratory Studies   Recent Labs  Lab 08/24/18 0237  NA 134*  K 4.1  CL 98  CO2 25  BUN 21*  CREATININE 1.76*  GLUCOSE 92  CALCIUM 8.8*  MG 2.1   Recent Labs  Lab 08/21/18 1210  AST 40  ALT 25  ALKPHOS 70    Recent Labs  Lab 08/22/18 0412 08/23/18 0229 08/24/18 0237  WBC 7.4 10.1 10.2  HGB 7.3* 8.6* 8.6*  HCT 23.9* 27.7* 27.8*  PLT 189 213 224   Recent Labs  Lab 08/22/18 0412 08/23/18 0229 08/24/18 0237  INR 1.8* 1.7* 1.5*   MELD-Na score: 24 at 08/23/2018  2:29 AM MELD score: 17 at 08/23/2018  2:29 AM Calculated from: Serum Creatinine: 1.69 mg/dL at 06/21/537  7:67 AM Serum Sodium: 130 mmol/L at 08/23/2018  2:29 AM Total Bilirubin: 1.0 mg/dL at 07/19/1933 90:24 PM INR(ratio): 2.0 at 08/21/2018 12:10 PM Age: 37 years  Imaging Studies  No new relevant studies to review  GI Procedures and Studies  Enteroscopy - No gross lesions in esophagus. - Z-line irregular, 40 cm from the incisors. - Small hiatal hernia. - Erythematous mucosa in the antrum. No other gross lesions in the stomach. Biopsied for HP. - Non-bleeding duodenal ulcers and erosions with a clean base (Forrest Class III). - Normal mucosa was found in the second portion of the duodenum, in the third portion of the duodenum and in the fourth portion  of the duodenum. - Normal mucosa was found in the jejunum. Tattooed distal extent.  Colonoscopy - Non-thrombosed external hemorrhoids found on digital rectal exam. - Five 2 to 6 mm polyps at the recto-sigmoid colon and in the descending colon, removed with a cold snare. Resected and retrieved. Clip (MR conditional) was placed. - Normal mucosa in the entire examined colon otherwise. - Non-bleeding non-thrombosed external and internal hemorrhoids.   ASSESSMENT  Mr. Millican is a 48 y.o. male with a pmh significant for CHF (status post LVAD on anticoagulation), GERD, hypertension, hyperlipidemia, tobacco use, hemorrhoids, colon polyps.  The patient  is hemodynamically and clinically stable today.  His hemoglobin trend is stable.  He is now on heparin.  Is not having any evidence of recurrent bleeding.  We have left recommendations for management of hemorrhoids in a conservative fashion if able.  No recurrent rectal bleeding currently.  If he has recurrent issues we will then have the patient be evaluated by 1 of my partners who may consider hemorrhoidal banding.  IV iron given during this hospitalization and should receive another dose of IV iron in approximately 2 to 3 weeks.  To optimize his stores would be best to start him on once daily oral iron as well.  We will schedule a follow-up visit with me via telehealth in approximately 3 to 5 weeks.  I think it is reasonable to go ahead and start Coumadin and monitor the patient.  Patient agrees with this plan of action.   PLAN/RECOMMENDATIONS  -Maintain PPI twice daily for next 2 to 3 months - Colace capsule(s) orally 100 mg BID. - Use hydrocortisone suppository 25 mg 1 per rectum once a day for next 2-weeks at bedtime. - Preparation H cream: Apply externally BID. - Sitz bathes twice daily. -If patient has persistent rectal bleeding from hemorrhoids we will consider a hemorrhoidal banding procedure - Follow-up pathology to dictate follow-up colonoscopy and also to ensure he does not have H. pylori   Dr. Marina Goodell takes over the service on 4/10.  GI will sign off.  Please page/call with questions or concerns.   Corliss Parish, MD [facility] Advanced [facility]oscopy Office # [phone]    LOS: 3 days  Lemar Lofty  08/24/2018, 3:22 PM

## 2018-08-24 NOTE — Progress Notes (Signed)
ANTICOAGULATION CONSULT NOTE - Follow Up Consult  Pharmacy Consult for warfarin Indication: LVAD  No Known Allergies  Patient Measurements: Height: 6\' 3"  (190.5 cm) Weight: 225 lb 11.2 oz (102.4 kg) IBW/kg (Calculated) : 84.5  Vital Signs: Temp: 97.9 F (36.6 C) (04/09 0600) Temp Source: Oral (04/09 0600) BP: 97/80 (04/09 0700) Pulse Rate: 94 (04/09 0700)  Labs: Recent Labs    08/22/18 0412 08/23/18 0229 08/24/18 0237  HGB 7.3* 8.6* 8.6*  HCT 23.9* 27.7* 27.8*  PLT 189 213 224  LABPROT 20.6* 20.0* 17.6*  INR 1.8* 1.7* 1.5*  HEPARINUNFRC  --   --  <0.10*  CREATININE 1.61* 1.69* 1.76*    Estimated Creatinine Clearance: 68.7 mL/min (A) (by C-G formula based on SCr of 1.76 mg/dL (H)).   Medical History: Past Medical History:  Diagnosis Date  . Asthma   . CHF (congestive heart failure) (HCC)    a. 09/2016: EF 20-25% with cath showing normal cors  . GERD (gastroesophageal reflux disease)   . History of hiatal hernia    Assessment: 47 yom with LVAD on warfarin PTA presenting with SOB, AMS. Hgb initially 6.4> 8.6 improved after 2 uts PRBC. INR 2 on admission  - holding warfarin for GI w/u S/p 4 polyps removed.  Low dose heparin started 4/8, level undetectable. Hgb stable at 8.6, LDH stable at 400. INR down to 1.5. Discussed with HF team, will restart low dose warfarin tonight and continue low dose heparin. Aspirin has been stopped.    Goal of Therapy:  INR goal 2-2.5 Monitor platelets by anticoagulation protocol: Yes   Plan:  Warfarin 5mg  tonight Continue heparin at 1000/hr Daily INR/heparin level Will keep off aspirin  Sheppard Coil PharmD., BCPS Clinical Pharmacist 08/24/2018 7:59 AM

## 2018-08-24 NOTE — Progress Notes (Signed)
Patient ID: JAMAHL LEMMONS, male   DOB: 01-01-1971, 48 y.o.   MRN: [id number]   Advanced Heart Failure VAD Team Note  PCP-Cardiologist: Arvilla Meres, MD   Subjective:    Patient has had 2 units PRBCs => hgb stable at 8.6 today.  BM last night without melena/hematochezia.  EGD with gastritis and non-bleeding duodenal ulcers, 5 polyps removed on colonoscopy.  Protonix increased to bid, started hydrocortisone suppositories.   INR 1.5 today, on heparin gtt. LDH 360=>364=>427=>400. MAP in 80s.   He diuresed well again with IV Lasix, weight down 3 lbs.  Creatinine mildly higher at 1.76.  Breathing is getting better but not at his baseline still.  Cough resolved. CXR last night with improving pulmonary edema.  LVAD INTERROGATION:  HeartMate 3 LVAD:   Flow 4.9 liters/min, speed 5600, power 4.1, PI 3.5.    Objective:    Vital Signs:   Temp:  [97.7 F (36.5 C)-98.7 F (37.1 C)] 98.7 F (37.1 C) (04/09 0800) Pulse Rate:  [94-125] 94 (04/09 0700) Resp:  [16-30] 18 (04/08 2000) BP: (81-126)/(42-107) 97/80 (04/09 0700) SpO2:  [86 %-100 %] 95 % (04/09 0700) Weight:  [102.4 kg] 102.4 kg (04/09 0600) Last BM Date: 08/24/18 Mean arterial Pressure 80s  Intake/Output:   Intake/Output Summary (Last 24 hours) at 08/24/2018 0803 Last data filed at 08/24/2018 0700 Gross per 24 hour  Intake 1331.31 ml  Output 2505 ml  Net -1173.69 ml     Physical Exam    General: Well appearing this am. NAD.  HEENT: Normal. Neck: Supple, JVP 12 cm. Carotids OK.  Cardiac:  Mechanical heart sounds with LVAD hum present.  Lungs:  CTAB, normal effort.  Abdomen:  NT, ND, no HSM. No bruits or masses. +BS  LVAD exit site: Well-healed and incorporated. Dressing dry and intact. No erythema or drainage. Stabilization device present and accurately applied. Driveline dressing changed daily per sterile technique. Extremities:  Warm and dry. No cyanosis, clubbing, rash, or edema.  Neuro:  Alert & oriented x 3.  Cranial nerves grossly intact. Moves all 4 extremities w/o difficulty. Affect pleasant    Telemetry   NSR 90s (personally reviewed)   Labs   Basic Metabolic Panel: Recent Labs  Lab 08/21/18 1210 08/22/18 0412 08/23/18 0229 08/24/18 0237  NA 135 136 130* 134*  K 4.4 3.7 3.5 4.1  CL 102 101 98 98  CO2 21* 27 21* 25  GLUCOSE 139* 117* 92 92  BUN 25* 21* 18 21*  CREATININE 1.78* 1.61* 1.69* 1.76*  CALCIUM 9.1 8.8* 8.8* 8.8*  MG  --   --   --  2.1    Liver Function Tests: Recent Labs  Lab 08/21/18 1210  AST 40  ALT 25  ALKPHOS 70  BILITOT 1.0  PROT 8.0  ALBUMIN 3.9   No results for input(s): LIPASE, AMYLASE in the last 168 hours. No results for input(s): AMMONIA in the last 168 hours.  CBC: Recent Labs  Lab 08/21/18 1210 08/21/18 1944 08/22/18 0412 08/23/18 0229 08/24/18 0237  WBC 10.2 8.3 7.4 10.1 10.2  HGB 6.4* 7.4* 7.3* 8.6* 8.6*  HCT 23.3* 24.2* 23.9* 27.7* 27.8*  MCV 83.8 82.0 81.0 81.7 83.0  PLT 191 171 189 213 224    INR: Recent Labs  Lab 08/17/18 1454 08/21/18 1210 08/22/18 0412 08/23/18 0229 08/24/18 0237  INR 1.8* 2.0* 1.8* 1.7* 1.5*    Other results:  EKG:    Imaging   Dg Chest 2 View  Result Date:  08/23/2018 CLINICAL DATA:  Pneumonia. EXAM: CHEST - 2 VIEW COMPARISON:  Radiograph 08/21/2018 FINDINGS: Post median sternotomy. Improved cardiomegaly. Left ventricular assist device again seen. Improved lung aeration with improving lung volumes and decreasing pulmonary edema. Streaky opacities in the left mid lower lung zone consistent with atelectasis or scarring. No consolidative airspace disease. Trace fluid in the fissures. No large pleural effusion. No pneumothorax. IMPRESSION: 1. Improved CHF. Residual underlying cardiomegaly with left ventricular assist device and mild pulmonary edema. 2. Streaky left lung opacities favor atelectasis or scarring. No evidence of pneumonia. Electronically Signed   By: Narda Rutherford M.D.   On:  08/23/2018 21:36     Medications:     Scheduled Medications: . sodium chloride   Intravenous Once  . amiodarone  200 mg Oral Daily  . citalopram  10 mg Oral Daily  . docusate sodium  100 mg Oral BID  . furosemide  80 mg Intravenous BID  . gabapentin  300 mg Oral TID  . hydrALAZINE  25 mg Oral Q8H  . pantoprazole  40 mg Oral Daily  . senna  2 tablet Oral Daily  . sildenafil  20 mg Oral TID    Infusions: . sodium chloride    . heparin 1,000 Units/hr (08/24/18 0700)    PRN Medications: acetaminophen, ondansetron (ZOFRAN) IV    Assessment/Plan:    1. 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 He is on sildenafil for RV failure.  LVAD parameters are stable.  He was volume overloaded at admission, likely related to a degree of RV failure.  Good diuresis yesterday with weight down 3 lbs, creatinine stable 1.78 => 1.6 => 1.69 => 1.76.  Still volume overloaded on exam though improving.  LDH now trending down.  CXR last night with decreased pulmonary edema.  Now on heparin gtt, INR 1.5.  - Lasix 80 mg IV bid today again, follow I/Os and creatinine.  - Continue sildenafil 20 mg tid.  Sherryll Burger on hold with elevated creatinine. Continue hydralazine 25 mg tid and titrate up as needed, hopefully back to [facility] prior to discharge.   - Restart coumadin this evening with goal INR 2-2.5.  Will stop ASA with GI bleeding.  Continue heparin gtt until INR >1.8.  2. AKI: In setting of anemia and acute on chronic systolic CHF.  Creatinine mildly higher today.  3. GI bleeding: Patient has had 2 wks of episodes of both hematochezia and melena by his description.  Hgb 6.4 on admission, was 13.1 2 months ago.  2 units PRBCs 4/6 and 4/7, hgb 8.6 today (stable). EGD with gastritis and nonbleeding duodenal ulcers, colonoscopy with polyps.  - Increase PPI to bid.  - Treatment for hemorrhoids with hydrocortisone suppositories x 2 wks.   - Will stop ASA as above.     4.  Altered mental status: Resolved, likely due to Ambien 20 mg.  CT head showed no acute change.  5. OSA: CPAP at night.  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   I reviewed the LVAD parameters from today, and compared the results to the patient's prior recorded data.  No programming changes were made.  The LVAD is functioning within specified parameters.  The patient performs LVAD self-test daily.  LVAD interrogation was negative for any significant power changes, alarms or PI events/speed drops.  LVAD equipment check completed and is in good working order.  Back-up equipment present.   LVAD education done on emergency procedures and precautions and reviewed  exit site care.  Length of Stay: 3  Marca Ancona, MD 08/24/2018, 8:03 AM  VAD Team --- VAD ISSUES ONLY--- Pager [phone] (7am - 7am)  Advanced Heart Failure Team  Pager [phone] (M-F; 7a - 4p)  Please contact [facility] for night-coverage after hours (4p -7a ) and weekends on [email or web address]

## 2018-08-24 NOTE — Telephone Encounter (Signed)
The pt has been advised of the appt for 09/21/18 at 130 pm with Dr Meridee Score.  He was advised he needs to have the app Zoom on his phone and he agreed.  He will call with any concerns.

## 2018-08-24 NOTE — Plan of Care (Signed)
  Problem: Education: Goal: Knowledge of General Education information will improve Description Including pain rating scale, medication(s)/side effects and non-pharmacologic comfort measures Outcome: Progressing   

## 2018-08-24 NOTE — Progress Notes (Signed)
LVAD Coordinator Rounding Note:  Admitted 08/21/2018 per Dr. Aundra Dubin due to shortness of breath, tachypnea, and altered mental status.   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 Darcey Nora under Destination Therapy criteria.  Pt lying in bed, wanting to "go home". Explained discharge will be based on his INR; will re-check tomorrow. If low, he will need to remain on heparin gtt until it is within goal before he can be discharged home.   Vital signs: Temp: 98.7 HR: 94 Doppler Pressure:  97/80 (87) Automatic BP:  84 Sat: 98% on RA Wt:  238>230.6>228>225.7 lbs   LVAD interrogation reveals:  Speed: 5600 Flow:  Power:  w PI:  Alarms:  Events:  Hematocrit:  Fixed speed: 5600 Low speed limit: 5300  Drive Line: Twice weekly dressing changes with daily kit. Bedside nurse may perform dressing changes. Existing VAD dressing removed and site care performed using sterile technique. Drive line exit site cleaned with Chlora prep applicators x 2, allowed to dry, and gauze dressing with silver strip re-applied. Exit site healed and incorporated, the velour is fully implanted at exit site. No redness, tenderness, drainage, foul odor or rash noted. Drive line anchor removed and placed in area where drive line will not be pulled. Counseled patient on careful placement of anchor to prevent tugging/pulling on drive line. He verbalized understanding of same. Will advance patient to weekly dressing changes. Will provide patient with dressing supplies at discharge.    Next dressing change due: 08/31/18    Labs:  LDH trend: 360> 156>153>794  INR trend: 2.0>1.8>1.7>1.5  Creatinine: 1.78>1.61>1.69>1.76  Anticoagulation Plan: -INR Goal: 2.0 - 2.5 -ASA Dose: 81 mg daily   Device: none  Blood Products:  08/21/18>> 1 PRBC 08/22/18>> 1 PRBC  Adverse Events on VAD: -  Plan/Recommendations:  1. Call VAD pager if any issues/questions with VAD equipment or drive line.  2. Advance to weekly dressing kit  using weekly kit with Sorbaview dressing.  3. All 4 batteries at bedside belong to patient. He did not remember to bring his black back up bag, but did bring 2 extra batteries. Emergency equipment provided and at bedside.   Zada Girt RN [place] Coordinator  Office: [phone]  24/7 Pager: [phone]

## 2018-08-24 NOTE — Telephone Encounter (Signed)
-----   Message from Lemar Lofty., MD sent at 08/24/2018  3:32 PM EDT ----- Alexia Freestone, please schedule this patient a telehealth visit in approximately 3 to 5 weeks.  Thank you. GM

## 2018-08-24 NOTE — Discharge Summary (Addendum)
Advanced Heart Failure Discharge Note  Discharge Summary   Patient ID: Jonathan Wood MRN: [id number], DOB/AGE: [birth year] 48 y.o. Admit date: 08/21/2018 D/C date:     08/28/2018   Primary Discharge Diagnoses:  1. Acute on chronic systolic CHF - s/p HM3 LVAD - INR goal 2-2.5. Off ASA. - Speed 5600 2. AKI 3. GI bleed 4. AMS 5. OSA 6. History of driveline infection  Hospital Course:  Jonathan FurlStephone T Williamsis a 48 y.o.malewith h/o 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  [facility] with SOB and tachypnea. Labwork revealed Hgb of 6.4 with stable INR and elevated creatinine 1.78. He was also altered on arrival in setting of taking 20 mg ambien the evening before. Head CT showed no acute process.  Found to be volume overload with pulmonary edema on CXR. He was diuresed with IV lasix and transitioned to torsemide 20 mg daily.  Sherryll Burgerntresto was stopped with AKI and he was started on hydralazine. He was not placed on imdur due to use of sildenafil.  Renal function was monitored closely.   GI was consulted anemia and reported blood in stool for 2 weeks. EGD completed and showed gastritis and nonbleeding duodenal ulcers, colonoscopy with 5 polyps removed. Protonix was increased to BID, he was started on PO iron, and hydrocortisone suppositories for hemorrhoid treatment. He received a total of 2 uPRBCs. He was bridged with heparin drip and coumadin without further bleeding. He will stay off ASA for several weeks but can reconsider later. He will continue daily hydrocortisone suppositories nightly for a total of 2 weeks + stool softeners twice a day.   No problems with LVAD or driveline. LDH peaked at 427 and improved to 360 on the day of discharge. On the day of discharge INR was 1.6 and trending up. Patient adamant to go home in setting of Coronavirus and fact they are out of food at home. [facility]    He will be followed closely by LVAD team, with telehealth appointment on 09/06/18.   LVAD INTERROGATION:  HeartMate 3 LVAD:   Flow  liters/min 4.9  speed 5600, power 4 PI 3.1   Discharge Weight 221 pounds  Discharge Vitals: Blood pressure 100/70, pulse 91, temperature 98.5 F (36.9 C), temperature source Oral, resp. rate 15, height 6\' 3"  (1.905 m), weight 100.5 kg, SpO2 100 %.   Physical Exam: GENERAL: NAD. HEENT: normal  NECK: Supple, JVP 5-6  No lymphadenopathy or thyromegaly appreciated.   CARDIAC:  Mechanical heart sounds with LVAD hum present.  LUNGS:  Clear to auscultation bilaterally.  ABDOMEN:  Soft, round, nontender, positive bowel sounds x4.     LVAD exit site: well-healed and incorporated.  Dressing dry and intact.  No erythema or drainage.  Stabilization device present and accurately applied.  Driveline dressing is being changed daily per sterile technique. EXTREMITIES:  Warm and dry, no cyanosis, clubbing, rash or edema  NEUROLOGIC:  Alert and oriented x 4.  No aphasia.  No dysarthria.  Affect pleasant.      Labs: Lab Results  Component Value Date   WBC 9.6 08/28/2018   HGB 9.3 (L) 08/28/2018   HCT 31.7 (L) 08/28/2018   MCV 83.4 08/28/2018   PLT 315 08/28/2018    Recent Labs  Lab 08/21/18 1210  08/28/18 0300  NA 135   < > 136  K 4.4   < >  3.8  CL 102   < > 98  CO2 21*   < > 23  BUN 25*   < > 28*  CREATININE 1.78*   < > 1.77*  CALCIUM 9.1   < > 9.5  PROT 8.0  --   --   BILITOT 1.0  --   --   ALKPHOS 70  --   --   ALT 25  --   --   AST 40  --   --   GLUCOSE 139*   < > 131*   < > = values in this interval not displayed.   Lab Results  Component Value Date   CHOL 111 08/30/2017   HDL 33 (L) 08/30/2017   LDLCALC 70 08/30/2017   TRIG 38 08/30/2017   BNP (last 3 results) Recent Labs    09/07/17 0411 09/13/17 0348  BNP 272.7* 233.6*    ProBNP (last 3 results) No results for input(s): PROBNP in the last 8760 hours.    Diagnostic Studies/Procedures   No results found.  Discharge Medications   Allergies as of 08/28/2018   No Known Allergies     Medication List    STOP taking these medications   Aspirin Low Dose 81 MG EC tablet Generic drug:  aspirin   sacubitril-valsartan 49-51 MG Commonly known as:  ENTRESTO     TAKE these medications   amiodarone 200 MG tablet Commonly known as:  PACERONE Take 1 tablet (200 mg total) by mouth daily.   citalopram 10 MG tablet Commonly known as:  CELEXA TAKE 1 TABLET BY MOUTH ONCE DAILY   docusate sodium 100 MG capsule Commonly known as:  Colace Take 1 capsule (100 mg total) by mouth 2 (two) times daily.   ferrous sulfate 325 (65 FE) MG tablet Take 1 tablet (325 mg total) by mouth daily with breakfast. Start taking on:  August 29, 2018   gabapentin 300 MG capsule Commonly known as:  NEURONTIN Take 1 capsule (300 mg total) by mouth 3 (three) times daily.   hydrALAZINE 25 MG tablet Commonly known as:  APRESOLINE Take 1.5 tablets (37.5 mg total) by mouth every 8 (eight) hours.   hydrocortisone 25 MG suppository Commonly known as:  ANUSOL-HC Place 1 suppository (25 mg total) rectally at bedtime.   hydrOXYzine 25 MG tablet Commonly known as:  ATARAX/VISTARIL Take 25 mg by mouth every 8 (eight) hours as needed for anxiety.   pantoprazole 40 MG tablet Commonly known as:  PROTONIX Take 1 tablet (40 mg total) by mouth daily.   polyethylene glycol powder 17 GM/SCOOP powder Commonly known as:  GLYCOLAX/MIRALAX Take 17 g by mouth 2 (two) times daily.   potassium chloride SA 20 MEQ tablet Commonly known as:  Klor-Con M20 Take 1 tablet (20 mEq total) by mouth daily.   sildenafil 20 MG tablet Commonly known as:  REVATIO Take 1 tablet (20 mg total) by mouth 3 (three) times daily.   torsemide 20 MG tablet Commonly known as:  DEMADEX Take 1 tablet (20 mg total) by mouth daily for 30 days. Reports that he is taking every other day What  changed:    when to take this  reasons to take this   traZODone 50 MG tablet Commonly known as:  DESYREL Take 1 tablet (50 mg total) by mouth at bedtime.   warfarin 5 MG tablet Commonly known as:  COUMADIN Take as directed. If you are unsure how to take this medication, talk to your nurse or doctor.  Original instructions:  Take 5-7.5 mg by mouth See admin instructions. Take 7.5 mg on MON WED FRI, take 5mg  on all other days.   zolpidem 10 MG tablet Commonly known as:  AMBIEN Take 1 tablet (10 mg total) by mouth at bedtime as needed for up to 30 days for sleep. What changed:  how much to take       Disposition   The patient will be discharged in stable condition to home. Discharge Instructions    (HEART FAILURE PATIENTS) Call MD:  Anytime you have any of the following symptoms: 1) 3 pound weight gain in 24 hours or 5 pounds in 1 week 2) shortness of breath, with or without a dry hacking cough 3) swelling in the hands, feet or stomach 4) if you have to sleep on extra pillows at night in order to breathe.   Complete by:  As directed    Diet - low sodium heart healthy   Complete by:  As directed    Heart Failure patients record your daily weight using the same scale at the same time of day   Complete by:  As directed    INR  Goal: 2 - 2.5   Complete by:  As directed    Goal:  2 - 2.5   Increase activity slowly   Complete by:  As directed    [place] VAD Coordinator at [phone]  Notify for: any VAD alarms, sustained elevations of power >10 watts, sustained drop in Pulse Index <3   Complete by:  As directed    Notify for:   any VAD alarms sustained elevations of power >10 watts sustained drop in Pulse Index <3     Speed Settings:   Complete by:  As directed    Fixed 5600 RPM Low 5300 RPM         Duration of Discharge Encounter: Greater than 35 minutes   Signed, Tonye Becket, NP 08/28/2018, 9:16 AM   Patient seen and examined with Tonye Becket, NP. We discussed all aspects  of the encounter. I agree with the assessment and plan as stated above.   He is stable today. No further bleeding. INR 1.6 VAD interrogated personally. Parameters stable. Volume status looks good. INR was 1.6 and trending up. Patient adamant to go home in setting of Coronavirus and fact they are out of food at home. He will check INR on Friday. Hold ASA for several weeks to allow ulcer healing.   Arvilla Meres, MD  10:29 AM

## 2018-08-25 LAB — CBC
HCT: 28.8 % — ABNORMAL LOW (ref 39.0–52.0)
Hemoglobin: 8.6 g/dL — ABNORMAL LOW (ref 13.0–17.0)
MCH: 25.3 pg — ABNORMAL LOW (ref 26.0–34.0)
MCHC: 29.9 g/dL — ABNORMAL LOW (ref 30.0–36.0)
MCV: 84.7 fL (ref 80.0–100.0)
Platelets: 266 10*3/uL (ref 150–400)
RBC: 3.4 MIL/uL — ABNORMAL LOW (ref 4.22–5.81)
RDW: 18.9 % — ABNORMAL HIGH (ref 11.5–15.5)
WBC: 11.3 10*3/uL — ABNORMAL HIGH (ref 4.0–10.5)
nRBC: 0.2 % (ref 0.0–0.2)

## 2018-08-25 LAB — BASIC METABOLIC PANEL
Anion gap: 10 (ref 5–15)
BUN: 24 mg/dL — ABNORMAL HIGH (ref 6–20)
CO2: 25 mmol/L (ref 22–32)
Calcium: 9.2 mg/dL (ref 8.9–10.3)
Chloride: 101 mmol/L (ref 98–111)
Creatinine, Ser: 1.78 mg/dL — ABNORMAL HIGH (ref 0.61–1.24)
GFR calc Af Amer: 52 mL/min — ABNORMAL LOW (ref >60)
GFR calc non Af Amer: 45 mL/min — ABNORMAL LOW (ref >60)
Glucose, Bld: 93 mg/dL (ref 70–99)
Potassium: 4.1 mmol/L (ref 3.5–5.1)
Sodium: 136 mmol/L (ref 135–145)

## 2018-08-25 LAB — HEPARIN LEVEL (UNFRACTIONATED): Heparin Unfractionated: 0.14 IU/mL — ABNORMAL LOW (ref 0.30–0.70)

## 2018-08-25 LAB — PROTIME-INR
INR: 1.4 — ABNORMAL HIGH (ref 0.8–1.2)
Prothrombin Time: 17 seconds — ABNORMAL HIGH (ref 11.4–15.2)

## 2018-08-25 LAB — LACTATE DEHYDROGENASE: LDH: 356 U/L — ABNORMAL HIGH (ref 98–192)

## 2018-08-25 MED ORDER — WARFARIN SODIUM 7.5 MG PO TABS
7.5000 mg | ORAL_TABLET | Freq: Once | ORAL | Status: AC
  Administered 2018-08-25: 7.5 mg via ORAL
  Filled 2018-08-25: qty 1

## 2018-08-25 MED ORDER — FERROUS SULFATE 325 (65 FE) MG PO TABS
325.0000 mg | ORAL_TABLET | Freq: Every day | ORAL | Status: DC
  Administered 2018-08-26 – 2018-08-28 (×3): 325 mg via ORAL
  Filled 2018-08-25 (×4): qty 1

## 2018-08-25 MED ORDER — HYDRALAZINE HCL 50 MG PO TABS
50.0000 mg | ORAL_TABLET | Freq: Three times a day (TID) | ORAL | Status: DC
  Administered 2018-08-25 (×2): 50 mg via ORAL
  Administered 2018-08-26: 37.5 mg via ORAL
  Filled 2018-08-25 (×3): qty 1

## 2018-08-25 NOTE — Progress Notes (Addendum)
ANTICOAGULATION CONSULT NOTE - Follow Up Consult  Pharmacy Consult for warfarin Indication: LVAD  No Known Allergies  Patient Measurements: Height: 6\' 3"  (190.5 cm) Weight: 224 lb 6.9 oz (101.8 kg) IBW/kg (Calculated) : 84.5  Vital Signs: Temp: 98.6 F (37 C) (04/10 0741) Temp Source: Oral (04/10 0741) BP: 121/92 (04/10 0421) Pulse Rate: 101 (04/10 0741)  Labs: Recent Labs    08/23/18 0229 08/24/18 0237 08/25/18 0234  HGB 8.6* 8.6* 8.6*  HCT 27.7* 27.8* 28.8*  PLT 213 224 266  LABPROT 20.0* 17.6* 17.0*  INR 1.7* 1.5* 1.4*  HEPARINUNFRC  --  <0.10* 0.14*  CREATININE 1.69* 1.76* 1.78*    Estimated Creatinine Clearance: 67.8 mL/min (A) (by C-G formula based on SCr of 1.78 mg/dL (H)).   Medical History: Past Medical History:  Diagnosis Date  . Asthma   . CHF (congestive heart failure) (HCC)    a. 09/2016: EF 20-25% with cath showing normal cors  . GERD (gastroesophageal reflux disease)   . History of hiatal hernia    Assessment: 47 yom with LVAD on warfarin PTA presenting with SOB, AMS. Hgb initially 6.4> 8.6 improved after 2 uts PRBC. INR 2 on admission  - holding warfarin for GI w/u S/p 4 polyps removed.  Low dose heparin started 4/8, level low at 0.14 as expected. Hgb stable at 8.6 again today, LDH down 356. INR down to 1.4. Warfarin resumed last night.  Warfarin regimen prior to admission: Warfarin 7.5mg  on MWF and 5mg  all other days  Goal of Therapy:  INR goal 2-2.5 Monitor platelets by anticoagulation protocol: Yes   Plan:  Warfarin 7.5mg  tonight Continue heparin at 1000/hr Daily INR/heparin level Will keep off aspirin  Sheppard Coil PharmD., BCPS Clinical Pharmacist 08/25/2018 7:43 AM

## 2018-08-25 NOTE — Progress Notes (Signed)
Pt declined use of CPAP- machine at bedside if pt changes his mind

## 2018-08-25 NOTE — Plan of Care (Signed)
  Problem: Education: Goal: Knowledge of General Education information will improve Description Including pain rating scale, medication(s)/side effects and non-pharmacologic comfort measures Outcome: Progressing   

## 2018-08-25 NOTE — Progress Notes (Signed)
LVAD Coordinator Rounding Note:  Admitted 08/21/2018 per Dr. Aundra Dubin due to shortness of breath, tachypnea, and altered mental status.   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 Darcey Nora under Destination Therapy criteria.  Pt lying in bed, wanting to "go home". INR 1.4 today pt aware he will more than likely be here through the weekend.   Vital signs: Temp: 98.6 HR: 101 Doppler Pressure:  94 Automatic BP:  104/86 (93) Sat: 98% on RA Wt:  238>230.6>228>225.7>224.4 lbs   LVAD interrogation reveals:  Speed: 5600 Flow: 5.0 Power: 4.3 w PI: 3.2 Alarms: none Events: none Hematocrit: 29 Fixed speed: 5600 Low speed limit: 5300  Drive Line: Weekly dressing changes. Pt pulled some of dressing off during sleep last night. Existing VAD dressing removed and site care performed using sterile technique. Drive line exit site cleaned with Chlora prep applicators x 2, allowed to dry, and Sorbaview dressing with bio patch re-applied. Exit site healed and incorporated, the velour is fully implanted at exit site. No redness, tenderness, drainage, foul odor or rash noted. Drive line anchor re-applied.   Next dressing change due: 09/01/18    Labs:  LDH trend: 360> 364>427>400>356  INR trend: 2.0>1.8>1.7>1.5>1.4  Creatinine: 1.78>1.61>1.69>1.76>1.78  Anticoagulation Plan: -INR Goal: 2.0 - 2.5 -ASA Dose: 81 mg daily   Device: none  Blood Products:  08/21/18>> 1 PRBC 08/22/18>> 1 PRBC  Adverse Events on VAD: -  Plan/Recommendations:  1. Call VAD pager if any issues/questions with VAD equipment or drive line.  2. Advance to weekly dressing kit using weekly kit with Sorbaview dressing.  3. All 4 batteries at bedside belong to patient. He did not remember to bring his black back up bag, but did bring 2 extra batteries. Emergency equipment provided and at bedside.   [name] RN [place] Coordinator  Office: [phone]  24/7 Pager: [phone]

## 2018-08-25 NOTE — Progress Notes (Signed)
Patient ID: Jonathan Wood, male   DOB: 1971/01/11, 48 y.o.   MRN: [id number]   Advanced Heart Failure VAD Team Note  PCP-Cardiologist: Arvilla Meres, MD   Subjective:    Patient has had 2 units PRBCs => hgb stable at 8.6 today.  No overt GI bleeding.  EGD with gastritis and non-bleeding duodenal ulcers, 5 polyps removed on colonoscopy.  Protonix increased to bid, started hydrocortisone suppositories.   INR 1.4 today, on heparin gtt. LDH 360=>364=>427=>400=>356. MAP in 90s.   He diuresed again with IV Lasix, weight down 1 lb.  Creatinine stable 1.78.  Breathing better.  Cough resolved.   LVAD INTERROGATION:  HeartMate 3 LVAD:   Flow 4.9 liters/min, speed 5600, power 4.2, PI 3.3.  No PI events.   Objective:    Vital Signs:   Temp:  [97.8 F (36.6 C)-98.7 F (37.1 C)] 98.6 F (37 C) (04/10 0741) Pulse Rate:  [89-112] 101 (04/10 0741) Resp:  [16-25] 19 (04/10 0741) BP: (85-121)/(65-103) 104/86 (04/10 0811) SpO2:  [92 %-99 %] 92 % (04/10 0741) Weight:  [101.8 kg] 101.8 kg (04/10 0500) Last BM Date: 08/24/18 Mean arterial Pressure 90s  Intake/Output:   Intake/Output Summary (Last 24 hours) at 08/25/2018 0901 Last data filed at 08/25/2018 0005 Gross per 24 hour  Intake 99.63 ml  Output 1935 ml  Net -1835.37 ml     Physical Exam    General: Well appearing this am. NAD.  HEENT: Normal. Neck: Supple, JVP 12 cm. Carotids OK.  Cardiac:  Mechanical heart sounds with LVAD hum present.  Lungs:  CTAB, normal effort.  Abdomen:  NT, ND, no HSM. No bruits or masses. +BS  LVAD exit site: Well-healed and incorporated. Dressing dry and intact. No erythema or drainage. Stabilization device present and accurately applied. Driveline dressing changed daily per sterile technique. Extremities:  Warm and dry. No cyanosis, clubbing, rash, or edema.  Neuro:  Alert & oriented x 3. Cranial nerves grossly intact. Moves all 4 extremities w/o difficulty. Affect pleasant     Telemetry    NSR 90s (personally reviewed)   Labs   Basic Metabolic Panel: Recent Labs  Lab 08/21/18 1210 08/22/18 0412 08/23/18 0229 08/24/18 0237 08/25/18 0234  NA 135 136 130* 134* 136  K 4.4 3.7 3.5 4.1 4.1  CL 102 101 98 98 101  CO2 21* 27 21* 25 25  GLUCOSE 139* 117* 92 92 93  BUN 25* 21* 18 21* 24*  CREATININE 1.78* 1.61* 1.69* 1.76* 1.78*  CALCIUM 9.1 8.8* 8.8* 8.8* 9.2  MG  --   --   --  2.1  --     Liver Function Tests: Recent Labs  Lab 08/21/18 1210  AST 40  ALT 25  ALKPHOS 70  BILITOT 1.0  PROT 8.0  ALBUMIN 3.9   No results for input(s): LIPASE, AMYLASE in the last 168 hours. No results for input(s): AMMONIA in the last 168 hours.  CBC: Recent Labs  Lab 08/21/18 1944 08/22/18 0412 08/23/18 0229 08/24/18 0237 08/25/18 0234  WBC 8.3 7.4 10.1 10.2 11.3*  HGB 7.4* 7.3* 8.6* 8.6* 8.6*  HCT 24.2* 23.9* 27.7* 27.8* 28.8*  MCV 82.0 81.0 81.7 83.0 84.7  PLT 171 189 213 224 266    INR: Recent Labs  Lab 08/21/18 1210 08/22/18 0412 08/23/18 0229 08/24/18 0237 08/25/18 0234  INR 2.0* 1.8* 1.7* 1.5* 1.4*    Other results:  EKG:    Imaging   Dg Chest 2 View  Result Date: 08/23/2018  CLINICAL DATA:  Pneumonia. EXAM: CHEST - 2 VIEW COMPARISON:  Radiograph 08/21/2018 FINDINGS: Post median sternotomy. Improved cardiomegaly. Left ventricular assist device again seen. Improved lung aeration with improving lung volumes and decreasing pulmonary edema. Streaky opacities in the left mid lower lung zone consistent with atelectasis or scarring. No consolidative airspace disease. Trace fluid in the fissures. No large pleural effusion. No pneumothorax. IMPRESSION: 1. Improved CHF. Residual underlying cardiomegaly with left ventricular assist device and mild pulmonary edema. 2. Streaky left lung opacities favor atelectasis or scarring. No evidence of pneumonia. Electronically Signed   By: Narda Rutherford M.D.   On: 08/23/2018 21:36     Medications:     Scheduled  Medications: . sodium chloride   Intravenous Once  . amiodarone  200 mg Oral Daily  . citalopram  10 mg Oral Daily  . docusate sodium  100 mg Oral BID  . [START ON 08/26/2018] ferrous sulfate  325 mg Oral Q breakfast  . furosemide  80 mg Intravenous BID  . gabapentin  300 mg Oral TID  . hydrALAZINE  50 mg Oral Q8H  . hydrocortisone  25 mg Rectal QHS  . hydrocortisone cream   Topical BID  . pantoprazole  40 mg Oral BID  . senna  2 tablet Oral Daily  . sildenafil  20 mg Oral TID  . warfarin  7.5 mg Oral ONCE-1800  . Warfarin - Pharmacist Dosing Inpatient   Does not apply q1800    Infusions: . sodium chloride    . heparin 1,000 Units/hr (08/24/18 1950)    PRN Medications: acetaminophen, ondansetron (ZOFRAN) IV    Assessment/Plan:    1. 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 He is on sildenafil for RV failure.  LVAD parameters are stable.  He was volume overloaded at admission, likely related to a degree of RV failure.  Good diuresis again yesterday with weight down 1 lb, creatinine stable 1.78 => 1.6 => 1.69 => 1.76 => 1.78.  Still volume overloaded on exam though improving.  LDH now trending down, 356 today.  Now on heparin gtt, INR 1.4.  - Lasix 80 mg IV bid today again, follow I/Os and creatinine. Probably can transition to po diuretic tomorrow.  - Continue sildenafil 20 mg tid.  Sherryll Burger on hold with elevated creatinine. Increase hydralazine to 50 mg tid with elevated MAP. - Continue warfarin with goal INR 2-2.5.  Have stopped ASA with GI bleeding.  Continue heparin gtt until INR >1.8.  2. AKI: In setting of anemia and acute on chronic systolic CHF.  Creatinine stable today.  3. GI bleeding: Patient has had 2 wks of episodes of both hematochezia and melena by his description.  Hgb 6.4 on admission, was 13.1 2 months ago.  2 units PRBCs 4/6 and 4/7, hgb 8.6 today (stable). EGD with gastritis and nonbleeding duodenal ulcers, colonoscopy with  polyps.  - Increased PPI to bid.  - Treatment for hemorrhoids with hydrocortisone suppositories x 2 wks.   - Will stop ASA as above.     - Will give po iron per GI.  4. Altered mental status: Resolved, likely due to Ambien 20 mg.  CT head showed no acute change.  5. OSA: CPAP at night.  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   I reviewed the LVAD parameters from today, and compared the results to the patient's prior recorded data.  No programming changes were made.  The LVAD is functioning within specified parameters.  The patient performs LVAD self-test daily.  LVAD interrogation was negative for any significant power changes, alarms or PI events/speed drops.  LVAD equipment check completed and is in good working order.  Back-up equipment present.   LVAD education done on emergency procedures and precautions and reviewed exit site care.  Length of Stay: 4  Marca Ancona, MD 08/25/2018, 9:01 AM  VAD Team --- VAD ISSUES ONLY--- Pager [phone] (7am - 7am)  Advanced Heart Failure Team  Pager [phone] (M-F; 7a - 4p)  Please contact [facility] for night-coverage after hours (4p -7a ) and weekends on [email or web address]

## 2018-08-26 LAB — BASIC METABOLIC PANEL
Anion gap: 12 (ref 5–15)
BUN: 26 mg/dL — ABNORMAL HIGH (ref 6–20)
CO2: 23 mmol/L (ref 22–32)
Calcium: 9.2 mg/dL (ref 8.9–10.3)
Chloride: 98 mmol/L (ref 98–111)
Creatinine, Ser: 1.7 mg/dL — ABNORMAL HIGH (ref 0.61–1.24)
GFR calc Af Amer: 55 mL/min — ABNORMAL LOW (ref >60)
GFR calc non Af Amer: 48 mL/min — ABNORMAL LOW (ref >60)
Glucose, Bld: 206 mg/dL — ABNORMAL HIGH (ref 70–99)
Potassium: 3.5 mmol/L (ref 3.5–5.1)
Sodium: 133 mmol/L — ABNORMAL LOW (ref 135–145)

## 2018-08-26 LAB — HEPARIN LEVEL (UNFRACTIONATED): Heparin Unfractionated: 0.13 IU/mL — ABNORMAL LOW (ref 0.30–0.70)

## 2018-08-26 LAB — CBC
HCT: 29.2 % — ABNORMAL LOW (ref 39.0–52.0)
Hemoglobin: 8.9 g/dL — ABNORMAL LOW (ref 13.0–17.0)
MCH: 25.6 pg — ABNORMAL LOW (ref 26.0–34.0)
MCHC: 30.5 g/dL (ref 30.0–36.0)
MCV: 83.9 fL (ref 80.0–100.0)
Platelets: 277 10*3/uL (ref 150–400)
RBC: 3.48 MIL/uL — ABNORMAL LOW (ref 4.22–5.81)
RDW: 19.2 % — ABNORMAL HIGH (ref 11.5–15.5)
WBC: 10.2 10*3/uL (ref 4.0–10.5)
nRBC: 0.2 % (ref 0.0–0.2)

## 2018-08-26 LAB — PROTIME-INR
INR: 1.4 — ABNORMAL HIGH (ref 0.8–1.2)
Prothrombin Time: 16.6 seconds — ABNORMAL HIGH (ref 11.4–15.2)

## 2018-08-26 LAB — LACTATE DEHYDROGENASE: LDH: 296 U/L — ABNORMAL HIGH (ref 98–192)

## 2018-08-26 MED ORDER — POTASSIUM CHLORIDE CRYS ER 20 MEQ PO TBCR
40.0000 meq | EXTENDED_RELEASE_TABLET | Freq: Once | ORAL | Status: AC
  Administered 2018-08-26: 40 meq via ORAL
  Filled 2018-08-26: qty 2

## 2018-08-26 MED ORDER — TORSEMIDE 20 MG PO TABS
40.0000 mg | ORAL_TABLET | Freq: Every day | ORAL | Status: DC
  Administered 2018-08-26 – 2018-08-28 (×3): 40 mg via ORAL
  Filled 2018-08-26 (×3): qty 2

## 2018-08-26 MED ORDER — HYDRALAZINE HCL 25 MG PO TABS
37.5000 mg | ORAL_TABLET | Freq: Three times a day (TID) | ORAL | Status: DC
  Administered 2018-08-26 – 2018-08-28 (×6): 37.5 mg via ORAL
  Filled 2018-08-26 (×2): qty 2
  Filled 2018-08-26: qty 1.5
  Filled 2018-08-26 (×5): qty 2

## 2018-08-26 MED ORDER — WARFARIN SODIUM 4 MG PO TABS
8.0000 mg | ORAL_TABLET | Freq: Once | ORAL | Status: AC
  Administered 2018-08-26: 8 mg via ORAL
  Filled 2018-08-26: qty 2

## 2018-08-26 NOTE — Progress Notes (Signed)
ANTICOAGULATION CONSULT NOTE - Follow Up Consult  Pharmacy Consult for heparin and warfarin Indication: LVAD  No Known Allergies  Patient Measurements: Height: 6\' 3"  (190.5 cm) Weight: 224 lb 3.3 oz (101.7 kg) IBW/kg (Calculated) : 84.5  Vital Signs: Temp: 97.6 F (36.4 C) (04/11 0735) Temp Source: Oral (04/11 0735) BP: 97/62 (04/11 0735) Pulse Rate: 94 (04/11 0735)  Labs: Recent Labs    08/24/18 0237 08/25/18 0234 08/26/18 0201  HGB 8.6* 8.6* 8.9*  HCT 27.8* 28.8* 29.2*  PLT 224 266 277  LABPROT 17.6* 17.0* 16.6*  INR 1.5* 1.4* 1.4*  HEPARINUNFRC <0.10* 0.14* 0.13*  CREATININE 1.76* 1.78* 1.70*    Estimated Creatinine Clearance: 70.9 mL/min (A) (by C-G formula based on SCr of 1.7 mg/dL (H)).   Medical History: Past Medical History:  Diagnosis Date  . Asthma   . CHF (congestive heart failure) (HCC)    a. 09/2016: EF 20-25% with cath showing normal cors  . GERD (gastroesophageal reflux disease)   . History of hiatal hernia    Assessment: Jonathan Wood with LVAD on warfarin PTA presenting with SOB, AMS. Hgb initially 6.4> 8.6 improved after 2 uts PRBC. INR 2 on admission  - holding warfarin for GI w/u S/p 4 polyps removed.  Low dose heparin started 4/8, level low at 0.13 as expected. Hgb improved at 8.9 today, LDH down 296. INR unchanged at 1.4. Stop heparin and d/c home when INR >1.8  Warfarin regimen prior to admission: Warfarin 7.5mg  on MWF and 5mg  all other days  Goal of Therapy:  INR goal 2-2.5 Monitor platelets by anticoagulation protocol: Yes   Plan:  Warfarin 8mg  tonight Continue heparin at 1000/hr Daily INR/heparin level Will keep off aspirin  Sheppard Coil PharmD., BCPS Clinical Pharmacist 08/26/2018 9:02 AM

## 2018-08-26 NOTE — Progress Notes (Signed)
Patient ID: Jonathan Wood, male   DOB: 15-Dec-1970, 48 y.o.   MRN: [id number]   Advanced Heart Failure VAD Team Note  PCP-Cardiologist: Arvilla Meres, MD   Subjective:    Patient has had 2 units PRBCs => hgb higher at 8.9 today.  No overt GI bleeding.  EGD with gastritis and non-bleeding duodenal ulcers, 5 polyps removed on colonoscopy.  Protonix increased to bid, started hydrocortisone suppositories.   INR 1.4 today, on heparin gtt. LDH 360=>364=>427=>400=>356 =>296. MAP in 70s.   He diuresed well again with IV Lasix.  Creatinine stable 1.7.  Breathing better.  Cough resolved.   LVAD INTERROGATION:  HeartMate 3 LVAD:   Flow 4.8 liters/min, speed 5600, power 4.2, PI 3.2.  No PI events.   Objective:    Vital Signs:   Temp:  [97.6 F (36.4 C)-98.4 F (36.9 C)] 97.6 F (36.4 C) (04/11 0735) Pulse Rate:  [73-97] 94 (04/11 0735) Resp:  [16-22] 18 (04/11 0735) BP: (77-119)/(58-92) 97/62 (04/11 0735) SpO2:  [98 %-100 %] 98 % (04/11 0735) Weight:  [101.7 kg] 101.7 kg (04/11 0300) Last BM Date: 08/25/18 Mean arterial Pressure 70s  Intake/Output:   Intake/Output Summary (Last 24 hours) at 08/26/2018 0847 Last data filed at 08/26/2018 0735 Gross per 24 hour  Intake 499.73 ml  Output 2365 ml  Net -1865.27 ml     Physical Exam    General: Well appearing this am. NAD.  HEENT: Normal. Neck: Supple, JVP 9-10 cm. Carotids OK.  Cardiac:  Mechanical heart sounds with LVAD hum present.  Lungs:  CTAB, normal effort.  Abdomen:  NT, ND, no HSM. No bruits or masses. +BS  LVAD exit site: Well-healed and incorporated. Dressing dry and intact. No erythema or drainage. Stabilization device present and accurately applied. Driveline dressing changed daily per sterile technique. Extremities:  Warm and dry. No cyanosis, clubbing, rash, or edema.  Neuro:  Alert & oriented x 3. Cranial nerves grossly intact. Moves all 4 extremities w/o difficulty. Affect pleasant     Telemetry   NSR 90s  (personally reviewed)   Labs   Basic Metabolic Panel: Recent Labs  Lab 08/22/18 0412 08/23/18 0229 08/24/18 0237 08/25/18 0234 08/26/18 0201  NA 136 130* 134* 136 133*  K 3.7 3.5 4.1 4.1 3.5  CL 101 98 98 101 98  CO2 27 21* 25 25 23   GLUCOSE 117* 92 92 93 206*  BUN 21* 18 21* 24* 26*  CREATININE 1.61* 1.69* 1.76* 1.78* 1.70*  CALCIUM 8.8* 8.8* 8.8* 9.2 9.2  MG  --   --  2.1  --   --     Liver Function Tests: Recent Labs  Lab 08/21/18 1210  AST 40  ALT 25  ALKPHOS 70  BILITOT 1.0  PROT 8.0  ALBUMIN 3.9   No results for input(s): LIPASE, AMYLASE in the last 168 hours. No results for input(s): AMMONIA in the last 168 hours.  CBC: Recent Labs  Lab 08/22/18 0412 08/23/18 0229 08/24/18 0237 08/25/18 0234 08/26/18 0201  WBC 7.4 10.1 10.2 11.3* 10.2  HGB 7.3* 8.6* 8.6* 8.6* 8.9*  HCT 23.9* 27.7* 27.8* 28.8* 29.2*  MCV 81.0 81.7 83.0 84.7 83.9  PLT 189 213 224 266 277    INR: Recent Labs  Lab 08/22/18 0412 08/23/18 0229 08/24/18 0237 08/25/18 0234 08/26/18 0201  INR 1.8* 1.7* 1.5* 1.4* 1.4*    Other results:  EKG:    Imaging   No results found.   Medications:  Scheduled Medications: . sodium chloride   Intravenous Once  . amiodarone  200 mg Oral Daily  . citalopram  10 mg Oral Daily  . docusate sodium  100 mg Oral BID  . ferrous sulfate  325 mg Oral Q breakfast  . gabapentin  300 mg Oral TID  . hydrALAZINE  37.5 mg Oral Q8H  . hydrocortisone  25 mg Rectal QHS  . hydrocortisone cream   Topical BID  . pantoprazole  40 mg Oral BID  . potassium chloride  40 mEq Oral Once  . senna  2 tablet Oral Daily  . sildenafil  20 mg Oral TID  . torsemide  40 mg Oral Daily  . Warfarin - Pharmacist Dosing Inpatient   Does not apply q1800    Infusions: . sodium chloride    . heparin 1,000 Units/hr (08/26/18 0600)    PRN Medications: acetaminophen, ondansetron (ZOFRAN) IV    Assessment/Plan:    1. 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 He is on sildenafil for RV failure.  LVAD parameters are stable.  He was volume overloaded at admission, likely related to a degree of RV failure.  Good diuresis again yesterday, weight down overall significantly, creatinine stable 1.78 => 1.6 => 1.69 => 1.76 => 1.78 => 1.7.  Still mildly volume overloaded on exam though improving.  LDH now trending down, 296 today.  Now on heparin gtt + warfarin, INR 1.4.  - Will transition to torsemide 40 mg daily today.  - Continue sildenafil 20 mg tid.  Sherryll Burger on hold with elevated creatinine. MAP softer, will decrease hydralazine to 37.5 tid. - Continue warfarin with goal INR 2-2.5.  Have stopped ASA with GI bleeding.  Continue heparin gtt until INR >1.8.  2. AKI: In setting of anemia and acute on chronic systolic CHF.  Creatinine stable today.  3. GI bleeding: Patient has had 2 wks of episodes of both hematochezia and melena by his description.  Hgb 6.4 on admission, was 13.1 2 months ago.  2 units PRBCs 4/6 and 4/7, hgb 8.9 today (stable). EGD with gastritis and nonbleeding duodenal ulcers, colonoscopy with polyps.  - Increased PPI to bid.  - Treatment for hemorrhoids with hydrocortisone suppositories x 2 wks.   - Will stop ASA as above.     - Will give po iron per GI.  4. Altered mental status: Resolved, likely due to Ambien 20 mg.  CT head showed no acute change.  5. OSA: CPAP at night.  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   Home when INR reaches 1.8 and we can stop heparin gtt, Sunday or Monday.   I reviewed the LVAD parameters from today, and compared the results to the patient's prior recorded data.  No programming changes were made.  The LVAD is functioning within specified parameters.  The patient performs LVAD self-test daily.  LVAD interrogation was negative for any significant power changes, alarms or PI events/speed drops.  LVAD equipment check completed and is in good working order.   Back-up equipment present.   LVAD education done on emergency procedures and precautions and reviewed exit site care.  Length of Stay: 5  Marca Ancona, MD 08/26/2018, 8:47 AM  VAD Team --- VAD ISSUES ONLY--- Pager [phone] (7am - 7am)  Advanced Heart Failure Team  Pager [phone] (M-F; 7a - 4p)  Please contact [facility] for night-coverage after hours (4p -7a ) and weekends on [email or web address]

## 2018-08-27 LAB — BASIC METABOLIC PANEL
Anion gap: 13 (ref 5–15)
BUN: 26 mg/dL — ABNORMAL HIGH (ref 6–20)
CO2: 25 mmol/L (ref 22–32)
Calcium: 9.4 mg/dL (ref 8.9–10.3)
Chloride: 98 mmol/L (ref 98–111)
Creatinine, Ser: 1.73 mg/dL — ABNORMAL HIGH (ref 0.61–1.24)
GFR calc Af Amer: 54 mL/min — ABNORMAL LOW (ref >60)
GFR calc non Af Amer: 47 mL/min — ABNORMAL LOW (ref >60)
Glucose, Bld: 99 mg/dL (ref 70–99)
Potassium: 3.8 mmol/L (ref 3.5–5.1)
Sodium: 136 mmol/L (ref 135–145)

## 2018-08-27 LAB — PROTIME-INR
INR: 1.5 — ABNORMAL HIGH (ref 0.8–1.2)
Prothrombin Time: 18 seconds — ABNORMAL HIGH (ref 11.4–15.2)

## 2018-08-27 LAB — CBC
HCT: 29.6 % — ABNORMAL LOW (ref 39.0–52.0)
Hemoglobin: 8.9 g/dL — ABNORMAL LOW (ref 13.0–17.0)
MCH: 25.4 pg — ABNORMAL LOW (ref 26.0–34.0)
MCHC: 30.1 g/dL (ref 30.0–36.0)
MCV: 84.3 fL (ref 80.0–100.0)
Platelets: 302 10*3/uL (ref 150–400)
RBC: 3.51 MIL/uL — ABNORMAL LOW (ref 4.22–5.81)
RDW: 19 % — ABNORMAL HIGH (ref 11.5–15.5)
WBC: 9.6 10*3/uL (ref 4.0–10.5)
nRBC: 0 % (ref 0.0–0.2)

## 2018-08-27 LAB — LACTATE DEHYDROGENASE: LDH: 280 U/L — ABNORMAL HIGH (ref 98–192)

## 2018-08-27 LAB — HEPARIN LEVEL (UNFRACTIONATED): Heparin Unfractionated: 0.11 IU/mL — ABNORMAL LOW (ref 0.30–0.70)

## 2018-08-27 MED ORDER — WARFARIN SODIUM 10 MG PO TABS
10.0000 mg | ORAL_TABLET | Freq: Once | ORAL | Status: AC
  Administered 2018-08-27: 10 mg via ORAL
  Filled 2018-08-27: qty 1

## 2018-08-27 NOTE — Plan of Care (Signed)
  Problem: Education: Goal: Knowledge of General Education information will improve Description Including pain rating scale, medication(s)/side effects and non-pharmacologic comfort measures Outcome: Progressing   Problem: Health Behavior/Discharge Planning: Goal: Ability to manage health-related needs will improve Outcome: Progressing   Problem: Clinical Measurements: Goal: Ability to maintain clinical measurements within normal limits will improve Outcome: Progressing Goal: Will remain free from infection Outcome: Progressing Goal: Diagnostic test results will improve Outcome: Progressing Goal: Respiratory complications will improve Outcome: Progressing Goal: Cardiovascular complication will be avoided Outcome: Progressing   Problem: Activity: Goal: Risk for activity intolerance will decrease Outcome: Progressing   Problem: Nutrition: Goal: Adequate nutrition will be maintained Outcome: Progressing   Problem: Coping: Goal: Level of anxiety will decrease Outcome: Progressing   Problem: Elimination: Goal: Will not experience complications related to bowel motility Outcome: Progressing Goal: Will not experience complications related to urinary retention Outcome: Progressing   Problem: Pain Managment: Goal: General experience of comfort will improve Outcome: Progressing   Problem: Safety: Goal: Ability to remain free from injury will improve Outcome: Progressing   Problem: Skin Integrity: Goal: Risk for impaired skin integrity will decrease Outcome: Progressing   Problem: Cardiac: Goal: Ability to maintain an adequate cardiac output will improve Outcome: Progressing   Problem: Fluid Volume: Goal: Risk for excess fluid volume will decrease Outcome: Progressing   Problem: Respiratory: Goal: Will regain and/or maintain adequate ventilation Outcome: Progressing

## 2018-08-27 NOTE — Progress Notes (Signed)
Patient ID: Jonathan Wood, male   DOB: May 18, 1970, 48 y.o.   MRN: [id number]   Advanced Heart Failure VAD Team Note  PCP-Cardiologist: Arvilla Meres, MD   Subjective:    Patient has had 2 units PRBCs => hgb higher at 8.9 yesterday, no CBC yet today.  No overt GI bleeding.  EGD with gastritis and non-bleeding duodenal ulcers, 5 polyps removed on colonoscopy.  Protonix increased to bid, started hydrocortisone suppositories.   INR 1.4 today, on heparin gtt. LDH 360=>364=>427=>400=>356 =>296=>280. MAP in 86.   He is now on po torsemide.  Creatinine stable 1.7.  Breathing better.  Cough resolved. Weight down 16 lbs.   LVAD INTERROGATION:  HeartMate 3 LVAD:   Flow 5.0 liters/min, speed 5600, power 4.2, PI 3.  No PI events.   Objective:    Vital Signs:   Temp:  [97.7 F (36.5 C)-98.5 F (36.9 C)] 98.5 F (36.9 C) (04/12 0827) Pulse Rate:  [67-95] 90 (04/12 0827) Resp:  [18-29] 19 (04/12 0827) BP: (84-97)/(18-77) 97/77 (04/12 0827) SpO2:  [98 %-100 %] 99 % (04/12 0827) Weight:  [100.9 kg] 100.9 kg (04/12 0300) Last BM Date: 08/26/18 Mean arterial Pressure 70s  Intake/Output:   Intake/Output Summary (Last 24 hours) at 08/27/2018 0852 Last data filed at 08/27/2018 0827 Gross per 24 hour  Intake 1323.37 ml  Output 1350 ml  Net -26.63 ml     Physical Exam    General: Well appearing this am. NAD.  HEENT: Normal. Neck: Supple, JVP 9-10 cm. Carotids OK.  Cardiac:  Mechanical heart sounds with LVAD hum present.  Lungs:  CTAB, normal effort.  Abdomen:  NT, ND, no HSM. No bruits or masses. +BS  LVAD exit site: Well-healed and incorporated. Dressing dry and intact. No erythema or drainage. Stabilization device present and accurately applied. Driveline dressing changed daily per sterile technique. Extremities:  Warm and dry. No cyanosis, clubbing, rash, or edema.  Neuro:  Alert & oriented x 3. Cranial nerves grossly intact. Moves all 4 extremities w/o difficulty. Affect  pleasant     Telemetry   NSR 90s (personally reviewed)   Labs   Basic Metabolic Panel: Recent Labs  Lab 08/23/18 0229 08/24/18 0237 08/25/18 0234 08/26/18 0201 08/27/18 0227  NA 130* 134* 136 133* 136  K 3.5 4.1 4.1 3.5 3.8  CL 98 98 101 98 98  CO2 21* 25 25 23 25   GLUCOSE 92 92 93 206* 99  BUN 18 21* 24* 26* 26*  CREATININE 1.69* 1.76* 1.78* 1.70* 1.73*  CALCIUM 8.8* 8.8* 9.2 9.2 9.4  MG  --  2.1  --   --   --     Liver Function Tests: Recent Labs  Lab 08/21/18 1210  AST 40  ALT 25  ALKPHOS 70  BILITOT 1.0  PROT 8.0  ALBUMIN 3.9   No results for input(s): LIPASE, AMYLASE in the last 168 hours. No results for input(s): AMMONIA in the last 168 hours.  CBC: Recent Labs  Lab 08/22/18 0412 08/23/18 0229 08/24/18 0237 08/25/18 0234 08/26/18 0201  WBC 7.4 10.1 10.2 11.3* 10.2  HGB 7.3* 8.6* 8.6* 8.6* 8.9*  HCT 23.9* 27.7* 27.8* 28.8* 29.2*  MCV 81.0 81.7 83.0 84.7 83.9  PLT 189 213 224 266 277    INR: Recent Labs  Lab 08/23/18 0229 08/24/18 0237 08/25/18 0234 08/26/18 0201 08/27/18 0227  INR 1.7* 1.5* 1.4* 1.4* 1.5*    Other results:  EKG:    Imaging   No results found.  Medications:     Scheduled Medications: . sodium chloride   Intravenous Once  . amiodarone  200 mg Oral Daily  . citalopram  10 mg Oral Daily  . docusate sodium  100 mg Oral BID  . ferrous sulfate  325 mg Oral Q breakfast  . gabapentin  300 mg Oral TID  . hydrALAZINE  37.5 mg Oral Q8H  . hydrocortisone  25 mg Rectal QHS  . hydrocortisone cream   Topical BID  . pantoprazole  40 mg Oral BID  . senna  2 tablet Oral Daily  . sildenafil  20 mg Oral TID  . torsemide  40 mg Oral Daily  . warfarin  10 mg Oral ONCE-1800  . Warfarin - Pharmacist Dosing Inpatient   Does not apply q1800    Infusions: . sodium chloride    . heparin 1,000 Units/hr (08/27/18 0030)    PRN Medications: acetaminophen, ondansetron (ZOFRAN) IV    Assessment/Plan:    1. 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 He is on sildenafil for RV failure.  LVAD parameters are stable.  He was volume overloaded at admission, likely related to a degree of RV failure.  Now on po torsemide, weight down 16 lbs, creatinine stable 1.78 => 1.6 => 1.69 => 1.76 => 1.78 => 1.7 => 1.73.  Still mildly volume overloaded on exam though improving.  LDH now trending down, 280 today.  Now on heparin gtt + warfarin, INR 1.5.  - Continue torsemide 40 mg daily.  - Continue sildenafil 20 mg tid.  Sherryll Burger on hold with elevated creatinine. Continue hydralazine 37.5 tid. - Continue warfarin with goal INR 2-2.5.  Have stopped ASA with GI bleeding.  Continue heparin gtt until INR >/=1.8.  2. AKI: In setting of anemia and acute on chronic systolic CHF.  Creatinine stable today.  3. GI bleeding: Patient has had 2 wks of episodes of both hematochezia and melena by his description.  Hgb 6.4 on admission, was 13.1 2 months ago.  2 units PRBCs 4/6 and 4/7, hgb 8.9 yesterday, pending CBC today. EGD with gastritis and nonbleeding duodenal ulcers, colonoscopy with polyps.  - Increased PPI to bid.  - Treatment for hemorrhoids with hydrocortisone suppositories x 2 wks.   - Will stop ASA as above.     - Will give po iron per GI.  - Need CBC today.  4. Altered mental status: Resolved, likely due to Ambien 20 mg.  CT head showed no acute change.  5. OSA: CPAP at night.  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   Home when INR reaches 1.8 and we can stop heparin gtt, hopefully Monday.   I reviewed the LVAD parameters from today, and compared the results to the patient's prior recorded data.  No programming changes were made.  The LVAD is functioning within specified parameters.  The patient performs LVAD self-test daily.  LVAD interrogation was negative for any significant power changes, alarms or PI events/speed drops.  LVAD equipment check completed and is in good working  order.  Back-up equipment present.   LVAD education done on emergency procedures and precautions and reviewed exit site care.  Length of Stay: 6  Marca Ancona, MD 08/27/2018, 8:52 AM  VAD Team --- VAD ISSUES ONLY--- Pager [phone] (7am - 7am)  Advanced Heart Failure Team  Pager [phone] (M-F; 7a - 4p)  Please contact [facility] for night-coverage after hours (4p -7a ) and weekends on [email or web address]

## 2018-08-27 NOTE — Progress Notes (Signed)
Pt. Refused cpap for tonight. 

## 2018-08-27 NOTE — Progress Notes (Signed)
ANTICOAGULATION CONSULT NOTE - Follow Up Consult  Pharmacy Consult for heparin and warfarin Indication: LVAD  No Known Allergies  Patient Measurements: Height: 6\' 3"  (190.5 cm) Weight: 222 lb 7.1 oz (100.9 kg) IBW/kg (Calculated) : 84.5  Vital Signs: Temp: 98.4 F (36.9 C) (04/11 2300) Temp Source: Oral (04/11 2300) BP: 84/18 (04/11 2300) Pulse Rate: 75 (04/11 2300)  Labs: Recent Labs    08/25/18 0234 08/26/18 0201 08/27/18 0227  HGB 8.6* 8.9*  --   HCT 28.8* 29.2*  --   PLT 266 277  --   LABPROT 17.0* 16.6* 18.0*  INR 1.4* 1.4* 1.5*  HEPARINUNFRC 0.14* 0.13* 0.11*  CREATININE 1.78* 1.70* 1.73*    Estimated Creatinine Clearance: 64.4 mL/min (A) (by C-G formula based on SCr of 1.73 mg/dL (H)).   Medical History: Past Medical History:  Diagnosis Date  . Asthma   . CHF (congestive heart failure) (HCC)    a. 09/2016: EF 20-25% with cath showing normal cors  . GERD (gastroesophageal reflux disease)   . History of hiatal hernia    Assessment: 47 yom with LVAD on warfarin PTA presenting with SOB, AMS. Hgb initially 6.4> 8.6 improved after 2 uts PRBC. INR 2 on admission  - holding warfarin for GI w/u S/p 4 polyps removed.  Low dose heparin started 4/8, level low at 0.1 as expected, discussed with cards and will leave at low rate for now. Hgb improved at 8.9 today, LDH down 280. INR with very little change at 1.5. Stop heparin and d/c home when INR >1.8  Warfarin regimen prior to admission: Warfarin 7.5mg  on MWF and 5mg  all other days  Goal of Therapy:  INR goal 2-2.5 Monitor platelets by anticoagulation protocol: Yes   Plan:  Warfarin 10mg  tonight Continue heparin at 1000/hr Daily INR/heparin level Will keep off aspirin  Sheppard Coil PharmD., BCPS Clinical Pharmacist 08/27/2018 8:06 AM

## 2018-08-27 NOTE — Plan of Care (Signed)
  Problem: Education: Goal: Knowledge of General Education information will improve Description Including pain rating scale, medication(s)/side effects and non-pharmacologic comfort measures Outcome: Progressing   Problem: Health Behavior/Discharge Planning: Goal: Ability to manage health-related needs will improve Outcome: Progressing   Problem: Clinical Measurements: Goal: Ability to maintain clinical measurements within normal limits will improve Outcome: Progressing Goal: Will remain free from infection Outcome: Progressing Goal: Diagnostic test results will improve Outcome: Progressing Goal: Respiratory complications will improve Outcome: Progressing Goal: Cardiovascular complication will be avoided Outcome: Progressing   Problem: Activity: Goal: Risk for activity intolerance will decrease Outcome: Progressing   Problem: Nutrition: Goal: Adequate nutrition will be maintained Outcome: Progressing   Problem: Coping: Goal: Level of anxiety will decrease Outcome: Progressing   Problem: Elimination: Goal: Will not experience complications related to bowel motility Outcome: Progressing Goal: Will not experience complications related to urinary retention Outcome: Progressing   Problem: Pain Managment: Goal: General experience of comfort will improve Outcome: Progressing   Problem: Safety: Goal: Ability to remain free from injury will improve Outcome: Progressing   Problem: Skin Integrity: Goal: Risk for impaired skin integrity will decrease Outcome: Progressing   Problem: Cardiac: Goal: Ability to maintain an adequate cardiac output will improve Outcome: Progressing   Problem: Fluid Volume: Goal: Risk for excess fluid volume will decrease Outcome: Progressing   Problem: Respiratory: Goal: Will regain and/or maintain adequate ventilation Outcome: Progressing   

## 2018-08-28 LAB — BASIC METABOLIC PANEL
Anion gap: 15 (ref 5–15)
BUN: 28 mg/dL — ABNORMAL HIGH (ref 6–20)
CO2: 23 mmol/L (ref 22–32)
Calcium: 9.5 mg/dL (ref 8.9–10.3)
Chloride: 98 mmol/L (ref 98–111)
Creatinine, Ser: 1.77 mg/dL — ABNORMAL HIGH (ref 0.61–1.24)
GFR calc Af Amer: 53 mL/min — ABNORMAL LOW (ref >60)
GFR calc non Af Amer: 45 mL/min — ABNORMAL LOW (ref >60)
Glucose, Bld: 131 mg/dL — ABNORMAL HIGH (ref 70–99)
Potassium: 3.8 mmol/L (ref 3.5–5.1)
Sodium: 136 mmol/L (ref 135–145)

## 2018-08-28 LAB — CBC
HCT: 31.7 % — ABNORMAL LOW (ref 39.0–52.0)
Hemoglobin: 9.3 g/dL — ABNORMAL LOW (ref 13.0–17.0)
MCH: 24.5 pg — ABNORMAL LOW (ref 26.0–34.0)
MCHC: 29.3 g/dL — ABNORMAL LOW (ref 30.0–36.0)
MCV: 83.4 fL (ref 80.0–100.0)
Platelets: 315 10*3/uL (ref 150–400)
RBC: 3.8 MIL/uL — ABNORMAL LOW (ref 4.22–5.81)
RDW: 18.7 % — ABNORMAL HIGH (ref 11.5–15.5)
WBC: 9.6 10*3/uL (ref 4.0–10.5)
nRBC: 0 % (ref 0.0–0.2)

## 2018-08-28 LAB — PROTIME-INR
INR: 1.6 — ABNORMAL HIGH (ref 0.8–1.2)
Prothrombin Time: 19.3 seconds — ABNORMAL HIGH (ref 11.4–15.2)

## 2018-08-28 LAB — HEPARIN LEVEL (UNFRACTIONATED): Heparin Unfractionated: 0.23 IU/mL — ABNORMAL LOW (ref 0.30–0.70)

## 2018-08-28 LAB — LACTATE DEHYDROGENASE: LDH: 270 U/L — ABNORMAL HIGH (ref 98–192)

## 2018-08-28 MED ORDER — PANTOPRAZOLE SODIUM 40 MG PO TBEC: 40.0000 mg | DELAYED_RELEASE_TABLET | Freq: Two times a day (BID) | ORAL | 6 refills | Status: DC

## 2018-08-28 MED ORDER — HYDROCORTISONE ACETATE 25 MG RE SUPP: 25.0000 mg | Freq: Every day | RECTAL | 0 refills | Status: DC

## 2018-08-28 MED ORDER — TORSEMIDE 20 MG PO TABS: 20.0000 mg | ORAL_TABLET | Freq: Every day | ORAL | 3 refills | Status: DC

## 2018-08-28 MED ORDER — FERROUS SULFATE 325 (65 FE) MG PO TABS: 325.0000 mg | ORAL_TABLET | Freq: Every day | ORAL | 3 refills | Status: DC

## 2018-08-28 MED ORDER — PANTOPRAZOLE SODIUM 40 MG PO TBEC: 40.0000 mg | DELAYED_RELEASE_TABLET | Freq: Every day | ORAL | 6 refills | Status: DC

## 2018-08-28 MED ORDER — WARFARIN SODIUM 7.5 MG PO TABS: 7.5000 mg | ORAL_TABLET | Freq: Once | ORAL | Status: DC

## 2018-08-28 MED ORDER — HYDRALAZINE HCL 25 MG PO TABS: 37.5000 mg | ORAL_TABLET | Freq: Three times a day (TID) | ORAL | 6 refills | Status: DC

## 2018-08-28 NOTE — Progress Notes (Signed)
LVAD Coordinator Rounding Note:  Admitted 08/21/2018 per Dr. Aundra Dubin due to shortness of breath, tachypnea, and altered mental status.   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 Darcey Nora under Destination Therapy criteria.  Pt lying in bed, wanting to "go home". Pt states that he does not have a babysitter and they are running low on food at home. Dr. [name] notified of pts dilemma and will d/c with close INR follow up.   Vital signs: Temp: 98.5 HR: 89 Doppler Pressure:  100 Automatic BP:  100/70 (79) Sat: 100% on RA Wt:  238>230.6>228>225.7>224.4>221.5 lbs   LVAD interrogation reveals:  Speed: 5600 Flow: 5.0 Power: 4.2 w PI: 3.3 Alarms: none Events: none Hematocrit: 31 Fixed speed: 5600 Low speed limit: 5300  Drive Line: Weekly dressing changes by bedside nurse.  Next dressing change due: 09/01/18    Labs:  LDH trend: 360> 364>427>400>356>270  INR trend: 2.0>1.8>1.7>1.5>1.4>1.6  Creatinine: 1.78>1.61>1.69>1.76>1.78>1.77  Anticoagulation Plan: -INR Goal: 2.0 - 2.5 -ASA Dose: stopped this admission d/t bleeding episode   Device: none  Blood Products:  08/21/18>> 1 PRBC 08/22/18>> 1 PRBC  Adverse Events on VAD: -4/6 bleeding episode d/t internal hemorrhoids Hgb 6.6 received blood-colonscopy removed some polyps stopped ASA this admission  Plan/Recommendations:  1. Call VAD pager if any issues/questions with VAD equipment or drive line.  2. Advance to weekly dressing kit using weekly kit with Sorbaview dressing.  3. Pt ok to d/c home per Dr. [name]. INR at [facility] on Friday. Has appt on 4/22. Pt will be triaged by phone d/t COVID, may be able to push appt out if pt is doing well.  [name] RN [place] Coordinator  Office: [phone]  24/7 Pager: [phone]

## 2018-08-28 NOTE — Plan of Care (Signed)
  Problem: Education: Goal: Knowledge of General Education information will improve Description Including pain rating scale, medication(s)/side effects and non-pharmacologic comfort measures Outcome: Progressing   Problem: Health Behavior/Discharge Planning: Goal: Ability to manage health-related needs will improve Outcome: Progressing   Problem: Clinical Measurements: Goal: Ability to maintain clinical measurements within normal limits will improve Outcome: Progressing Goal: Will remain free from infection Outcome: Progressing Goal: Diagnostic test results will improve Outcome: Progressing Goal: Respiratory complications will improve Outcome: Progressing Goal: Cardiovascular complication will be avoided Outcome: Progressing   Problem: Activity: Goal: Risk for activity intolerance will decrease Outcome: Progressing   Problem: Nutrition: Goal: Adequate nutrition will be maintained Outcome: Progressing   Problem: Coping: Goal: Level of anxiety will decrease Outcome: Progressing   Problem: Elimination: Goal: Will not experience complications related to bowel motility Outcome: Progressing Goal: Will not experience complications related to urinary retention Outcome: Progressing   Problem: Pain Managment: Goal: General experience of comfort will improve Outcome: Progressing   Problem: Safety: Goal: Ability to remain free from injury will improve Outcome: Progressing   Problem: Skin Integrity: Goal: Risk for impaired skin integrity will decrease Outcome: Progressing   Problem: Cardiac: Goal: Ability to maintain an adequate cardiac output will improve Outcome: Progressing   Problem: Fluid Volume: Goal: Risk for excess fluid volume will decrease Outcome: Progressing   Problem: Respiratory: Goal: Will regain and/or maintain adequate ventilation Outcome: Progressing   

## 2018-08-28 NOTE — Progress Notes (Signed)
Removed PIV access x 2 and pt received discharge instructions. Pt understood it well. Pt's INR was 1.6 today and reinforced to take coumadin today evening time. Dr. [name] checked pt before discharged. HS [employer]

## 2018-08-28 NOTE — Progress Notes (Addendum)
ANTICOAGULATION CONSULT NOTE - Follow Up Consult  Pharmacy Consult for heparin and warfarin Indication: LVAD  No Known Allergies  Patient Measurements: Height: 6\' 3"  (190.5 cm) Weight: 221 lb 9 oz (100.5 kg)(scale A) IBW/kg (Calculated) : 84.5  Vital Signs: Temp: 98.3 F (36.8 C) (04/13 0415) Temp Source: Oral (04/13 0415) BP: 92/64 (04/13 0415) Pulse Rate: 91 (04/13 0415)  Labs: Recent Labs    08/26/18 0201 08/27/18 0227 08/27/18 0954 08/28/18 0300  HGB 8.9*  --  8.9* 9.3*  HCT 29.2*  --  29.6* 31.7*  PLT 277  --  302 315  LABPROT 16.6* 18.0*  --  19.3*  INR 1.4* 1.5*  --  1.6*  HEPARINUNFRC 0.13* 0.11*  --  0.23*  CREATININE 1.70* 1.73*  --  1.77*    Estimated Creatinine Clearance: 63 mL/min (A) (by C-G formula based on SCr of 1.77 mg/dL (H)).   Medical History: Past Medical History:  Diagnosis Date  . Asthma   . CHF (congestive heart failure) (HCC)    a. 09/2016: EF 20-25% with cath showing normal cors  . GERD (gastroesophageal reflux disease)   . History of hiatal hernia    Assessment: 13 yoM with LVAD on warfarin PTA admitted with SOB and low Hgb now s/p 4 polyps removed by GI. Aspirin has now been discontinued.  Low-dose heparin started at 1000 units/hr on 4/8 - heparin level slightly subtherapeutic this morning but now trending up to 0.23 so will not adjust. INR remains subtherapeutic at 1.6 but is now trending up after several boosted doses. CBC remains stable, LDH trending down. Planning for discharge home once INR 1.8.  Warfarin regimen prior to admission: Warfarin 7.5mg  on MWF and 5mg  all other days  Goal of Therapy:  INR goal 2-2.5 Monitor platelets by anticoagulation protocol: Yes   Plan:  -Continue heparin 1000 units/hr -Warfarin 7.5mg  PO x1 tonight -Daily heparin level, INR, CBC, LDH  ADDENDUM: Pt to be discharged today, no enoxaparin bridge per MD. I suspect INR will increase tomorrow, so would recommend resuming prior home regimen of  7.5mg  Mon/Wed/Fri and 5mg  all other days. Follow-up INR check planned for Friday.    Fredonia Highland, PharmD, BCPS Clinical Pharmacist Please check AMION for all [facility] numbers 08/28/2018

## 2018-08-28 NOTE — Discharge Instructions (Signed)

## 2018-08-29 ENCOUNTER — Encounter: Payer: Self-pay | Admitting: Gastroenterology

## 2018-08-30 ENCOUNTER — Telehealth (HOSPITAL_COMMUNITY): Payer: Self-pay | Admitting: Licensed Clinical Social Worker

## 2018-08-30 NOTE — Telephone Encounter (Signed)
CSW contacted patient to follow up on recent hospitalization and check to make sure of adequate food and medication and remind of the importance to stay home and social distance when possible. CSW left message and encouraged patient to return call. Lasandra Beech, LCSW, CCSW-MCS [phone]

## 2018-08-31 ENCOUNTER — Encounter (HOSPITAL_COMMUNITY): Payer: Medicaid Other

## 2018-09-01 ENCOUNTER — Ambulatory Visit (HOSPITAL_COMMUNITY): Payer: Self-pay | Admitting: Pharmacist

## 2018-09-01 ENCOUNTER — Telehealth (HOSPITAL_COMMUNITY): Payer: Self-pay | Admitting: Licensed Clinical Social Worker

## 2018-09-01 ENCOUNTER — Other Ambulatory Visit: Payer: Self-pay

## 2018-09-01 ENCOUNTER — Encounter (HOSPITAL_COMMUNITY)
Admission: RE | Admit: 2018-09-01 | Discharge: 2018-09-01 | Disposition: A | Discharge status: Home / Self Care | Payer: Medicaid Other | Source: Ambulatory Visit | Attending: Internal Medicine | Admitting: Internal Medicine

## 2018-09-01 DIAGNOSIS — Z95811 Presence of heart assist device: Secondary | ICD-10-CM

## 2018-09-01 LAB — PROTIME-INR
INR: 2 — ABNORMAL HIGH (ref 0.8–1.2)
Prothrombin Time: 22.7 seconds — ABNORMAL HIGH (ref 11.4–15.2)

## 2018-09-01 NOTE — Telephone Encounter (Signed)
CSW contacted patient to follow up on weekly food delivery package. Patient informed of delivery time and no face to face contact during delivery. Patient verbalizes understanding and grateful for the assistance.  CSW continues to follow for supportive needs. Jackie Minerva Bluett, LCSW, CCSW-MCS [phone]  

## 2018-09-05 ENCOUNTER — Telehealth: Payer: Self-pay

## 2018-09-05 ENCOUNTER — Telehealth: Payer: Self-pay | Admitting: Cardiology

## 2018-09-05 NOTE — Telephone Encounter (Signed)
Pt is sch for a telehealth visit w/Amy Filbert Schilder, NP tomorrow, will send to her to address.

## 2018-09-05 NOTE — Telephone Encounter (Signed)
New Message   Pt c/o medication issue:  1. Name of Medication: Hydralazine  2. How are you currently taking this medication (dosage and times per day)? 25mg  1.5 tablets every 8 hours   3. Are you having a reaction (difficulty breathing--STAT)? No  4. What is your medication issue? Verlon Au states they need clarification on whether the patient was suppose to stop taking Entresto because it was discontinued and start taking the hydralazine.  Verlon Au states they are confused because patient is still taking Sherryll Burger so it must not have been discontinued.

## 2018-09-05 NOTE — Telephone Encounter (Signed)
Jonathan Wood has given consent to a video Doxy[employer]me visit with Dr. Mayford Knife. Jonathan Wood is currently at his PCP and will have them take his BP, HR, and weight.    YOUR CARDIOLOGY TEAM HAS ARRANGED FOR AN E-VISIT FOR YOUR APPOINTMENT - PLEASE REVIEW IMPORTANT INFORMATION BELOW SEVERAL DAYS PRIOR TO YOUR APPOINTMENT  Due to the recent COVID-19 pandemic, we are transitioning in-person office visits to tele-medicine visits in an effort to decrease unnecessary exposure to our patients and staff. Medicare and most insurances are covering these visits without a copay needed. You will need a working email and a smartphone or computer with a camera and microphone. For patients that do not have these items, we can still complete the visit using a telephone but do prefer video when possible. If possible, we also ask that you have a blood pressure cuff and scale at home to measure your blood pressure, heart rate and weight prior to your scheduled appointment. Patients with clinical needs that need an in-person evaluation and testing will still be able to come to the office if absolutely necessary. If you have any questions, feel free to call our office.     DOWNLOADING THE SOFTWARE  Download the [employer] app to enable video and telephone visits with your [facility] Provider.   Instructions for downloading [employer] WebEx: - Go to https://www.webex.com/downloads.html and follow the instructions, or download the app on your smartphone [facility] [employer] Meetings). - If you have technical difficulties with downloading WebEx, please call WebEx at [phone]. - Once the app is downloaded (can be done on either mobile or desktop computer), go to Settings in the upper left hand corner.  Be sure that camera and audio are enabled.  - You will receive an email message with a link to the meeting with a time to join for your tele-health visit.  - Please download the app and have settings configured prior to the appointment time.      2-3  DAYS BEFORE YOUR APPOINTMENT  One of our staff will call you to confirm that you have been able to set up your WebEx account. We will remind you check your blood pressure, heart rate and weight prior to your scheduled appointment. If you have an Apple Watch or Kardia, please upload any pertinent ECG strips the day before or morning of your appointment to MyChart. Our staff will also make sure you have reviewed the consent and agree to move forward with your scheduled tele-health visit.    THE DAY OF YOUR APPOINTMENT  Approximately 15-20 minutes prior to your scheduled appointment, you will receive an e-mail directly from one of our staff member's [email or web address] e-mail accounts inviting you to join a WebEx meeting.  Please do not reply to that email - simply join the [employer].  Upon joining, a member of the office staff will speak with you initially through the WebEx platform to confirm medications, vital signs for the day and any symptoms you may be experiencing.  Please have this information available prior to the time of visit start.      CONSENT FOR TELE-HEALTH VISIT - PLEASE RVIEW  I hereby voluntarily request, consent and authorize [facility] and its employed or contracted physicians, physician assistants, nurse practitioners or other licensed health care professionals (the Practitioner), to provide me with telemedicine health care services (the "Services") as deemed necessary by the treating Practitioner. I acknowledge and consent to receive the Services by the Practitioner via telemedicine. I understand that the telemedicine visit  will involve communicating with the Practitioner through live audiovisual communication technology and the disclosure of certain medical information by electronic transmission. I acknowledge that I have been given the opportunity to request an in-person assessment or other available alternative prior to the telemedicine visit and am voluntarily participating  in the telemedicine visit.  I understand that I have the right to withhold or withdraw my consent to the use of telemedicine in the course of my care at any time, without affecting my right to future care or treatment, and that the Practitioner or I may terminate the telemedicine visit at any time. I understand that I have the right to inspect all information obtained and/or recorded in the course of the telemedicine visit and may receive copies of available information for a reasonable fee.  I understand that some of the potential risks of receiving the Services via telemedicine include:  Marland Kitchen Delay or interruption in medical evaluation due to technological equipment failure or disruption; . Information transmitted may not be sufficient (e.g. poor resolution of images) to allow for appropriate medical decision making by the Practitioner; and/or  . In rare instances, security protocols could fail, causing a breach of personal health information.  Furthermore, I acknowledge that it is my responsibility to provide information about my medical history, conditions and care that is complete and accurate to the best of my ability. I acknowledge that Practitioner's advice, recommendations, and/or decision may be based on factors not within their control, such as incomplete or inaccurate data provided by me or distortions of diagnostic images or specimens that may result from electronic transmissions. I understand that the practice of medicine is not an exact science and that Practitioner makes no warranties or guarantees regarding treatment outcomes. I acknowledge that I will receive a copy of this consent concurrently upon execution via email to the email address I last provided but may also request a printed copy by calling the office of [facility].    I understand that my insurance will be billed for this visit.   I have read or had this consent read to me. . I understand the contents of this consent, which  adequately explains the benefits and risks of the Services being provided via telemedicine.  . I have been provided ample opportunity to ask questions regarding this consent and the Services and have had my questions answered to my satisfaction. . I give my informed consent for the services to be provided through the use of telemedicine in my medical care  By participating in this telemedicine visit I agree to the above.

## 2018-09-06 ENCOUNTER — Other Ambulatory Visit (HOSPITAL_COMMUNITY): Payer: Self-pay | Admitting: Unknown Physician Specialty

## 2018-09-06 ENCOUNTER — Encounter (HOSPITAL_COMMUNITY): Payer: Self-pay

## 2018-09-06 ENCOUNTER — Ambulatory Visit (HOSPITAL_COMMUNITY)
Admission: RE | Admit: 2018-09-06 | Discharge: 2018-09-06 | Disposition: A | Discharge status: Home / Self Care | Payer: Medicaid Other | Source: Ambulatory Visit | Attending: Adult Health | Admitting: Adult Health

## 2018-09-06 ENCOUNTER — Other Ambulatory Visit: Payer: Self-pay

## 2018-09-06 ENCOUNTER — Encounter: Payer: Self-pay | Admitting: Cardiology

## 2018-09-06 VITALS — Wt 221.0 lb

## 2018-09-06 DIAGNOSIS — D649 Anemia, unspecified: Secondary | ICD-10-CM

## 2018-09-06 DIAGNOSIS — Z720 Tobacco use: Secondary | ICD-10-CM

## 2018-09-06 DIAGNOSIS — I5042 Chronic combined systolic (congestive) and diastolic (congestive) heart failure: Secondary | ICD-10-CM

## 2018-09-06 DIAGNOSIS — Z95811 Presence of heart assist device: Secondary | ICD-10-CM

## 2018-09-06 DIAGNOSIS — Z7901 Long term (current) use of anticoagulants: Secondary | ICD-10-CM

## 2018-09-06 DIAGNOSIS — Z9989 Dependence on other enabling machines and devices: Principal | ICD-10-CM

## 2018-09-06 DIAGNOSIS — G4733 Obstructive sleep apnea (adult) (pediatric): Secondary | ICD-10-CM

## 2018-09-06 HISTORY — DX: Obstructive sleep apnea (adult) (pediatric): G47.33

## 2018-09-06 NOTE — Patient Instructions (Signed)
Follow up for video visit in 2-3 weeks   Stop taking entresto.    Check BMET, CBC, LDH, and INR on Friday at [facility]

## 2018-09-06 NOTE — Progress Notes (Signed)
Virtual Visit via Video Note   This visit type was conducted due to national recommendations for restrictions regarding the COVID-19 Pandemic (e.g. social distancing) in an effort to limit this patient's exposure and mitigate transmission in our community.  Due to his co-morbid illnesses, this patient is at least at moderate risk for complications without adequate follow up.  This format is felt to be most appropriate for this patient at this time.  All issues noted in this document were discussed and addressed.  A limited physical exam was performed with this format.  Please refer to the patient's chart for his consent to telehealth for [facility]  Evaluation Performed:  Follow-up visit  This visit type was conducted due to national recommendations for restrictions regarding the COVID-19 Pandemic (e.g. social distancing).  This format is felt to be most appropriate for this patient at this time.  All issues noted in this document were discussed and addressed.  No physical exam was performed (except for noted visual exam findings with Video Visits).  Please refer to the patient's chart (MyChart message for video visits and phone note for telephone visits) for the patient's consent to telehealth for [facility].  Date:  09/07/2018   ID:  Jonathan Wood, DOB 01-19-1971, MRN [id number]  Patient Location:  Home  Provider location:   [name]  PCP:  Patient, No Pcp Per  Cardiologist:  Arvilla Meres, MD  Sleep Medicine:  Armanda Magic, MD Electrophysiologist:  None   Chief Complaint:  OSA  History of Present Illness:    Jonathan Wood is a 48 y.o. male who presents via audio/video conferencing for a telehealth visit today.    This is a 48yo male with a history of chronic CHF who was referred by Dr. [name] for evaluation of OSA due to CHF and snoring.  His Epworth Sleepiness Score was elevated at 17. He underwent PSG showing severe OSA with an AHI of 68/hr and O2  desats as low as 74%.  He underwent CPAP titration to 12cm H2O.  He is doing well with his CPAP device and thinks that he has gotten used to it.  He tolerates the full face mask but says that is it big and clunky and doesn't like the feel of it.  He took the mask apart and now cannot get it back together and has not used it in a week.  He feels the pressure is adequate.  Since going on CPAP he feels more rested in the am and but still naps.  He denies any significant mouth or nasal dryness or nasal congestion.  He does not think that he snores.    The patient does not have symptoms concerning for COVID-19 infection (fever, chills, cough, or new shortness of breath).    Prior CV studies:   The following studies were reviewed today:  PAP compliance report  Past Medical History:  Diagnosis Date   Asthma    CHF (congestive heart failure) (HCC)    a. 09/2016: EF 20-25% with cath showing normal cors   GERD (gastroesophageal reflux disease)    History of hiatal hernia    OSA on CPAP 09/06/2018   Severe OSA with AHI 68/hr on CPAP at 12cm H2O   Past Surgical History:  Procedure Laterality Date   BIOPSY  08/23/2018   Procedure: BIOPSY;  Surgeon: Lemar Lofty., MD;  Location: [facility] ENDOSCOPY;  Service: Gastroenterology;;   COLONOSCOPY N/A 08/23/2018   Procedure: COLONOSCOPY;  Surgeon: Lemar Lofty.,  MD;  Location: [facility] ENDOSCOPY;  Service: Gastroenterology;  Laterality: N/A;   ENTEROSCOPY N/A 08/23/2018   Procedure: ENTEROSCOPY;  Surgeon: Meridee Score Netty Starring., MD;  Location: [facility] ENDOSCOPY;  Service: Gastroenterology;  Laterality: N/A;   HEMOSTASIS CLIP PLACEMENT  08/23/2018   Procedure: HEMOSTASIS CLIP PLACEMENT;  Surgeon: Lemar Lofty., MD;  Location: [facility] ENDOSCOPY;  Service: Gastroenterology;;   IABP INSERTION N/A 09/05/2017   Procedure: IABP INSERTION;  Surgeon: Dolores Patty, MD;  Location: [facility] INVASIVE CV LAB;  Service: Cardiovascular;  Laterality: N/A;    INSERTION OF IMPLANTABLE LEFT VENTRICULAR ASSIST DEVICE N/A 09/06/2017   Procedure: INSERTION OF IMPLANTABLE LEFT VENTRICULAR ASSIST DEVICE - HM3;  Surgeon: Kerin Perna, MD;  Location: [facility] OR;  Service: Open Heart Surgery;  Laterality: N/A;  HM3   NASAL FRACTURE SURGERY  1987   POLYPECTOMY  08/23/2018   Procedure: POLYPECTOMY;  Surgeon: Mansouraty, Netty Starring., MD;  Location: [facility] ENDOSCOPY;  Service: Gastroenterology;;   RIGHT HEART CATH N/A 08/30/2017   Procedure: RIGHT HEART CATH;  Surgeon: Dolores Patty, MD;  Location: [facility] INVASIVE CV LAB;  Service: Cardiovascular;  Laterality: N/A;   RIGHT/LEFT HEART CATH AND CORONARY ANGIOGRAPHY N/A 10/08/2016   Procedure: Right/Left Heart Cath and Coronary Angiography;  Surgeon: Orpah Cobb, MD;  Location: [facility] INVASIVE CV LAB;  Service: Cardiovascular;  Laterality: N/A;   SUBMUCOSAL TATTOO INJECTION  08/23/2018   Procedure: SUBMUCOSAL TATTOO INJECTION;  Surgeon: Lemar Lofty., MD;  Location: [facility] ENDOSCOPY;  Service: Gastroenterology;;   TEE WITHOUT CARDIOVERSION N/A 09/06/2017   Procedure: TRANSESOPHAGEAL ECHOCARDIOGRAM (TEE);  Surgeon: Donata Clay, Theron Arista, MD;  Location: [facility] OR;  Service: Open Heart Surgery;  Laterality: N/A;     Current Meds  Medication Sig   amiodarone (PACERONE) 200 MG tablet Take 1 tablet (200 mg total) by mouth daily.   citalopram (CELEXA) 10 MG tablet TAKE 1 TABLET BY MOUTH ONCE DAILY (Patient taking differently: Take 10 mg by mouth daily. )   docusate sodium (COLACE) 100 MG capsule Take 1 capsule (100 mg total) by mouth 2 (two) times daily. (Patient taking differently: Take 100 mg by mouth as needed. )   ferrous sulfate 325 (65 FE) MG tablet Take 1 tablet (325 mg total) by mouth daily with breakfast.   gabapentin (NEURONTIN) 300 MG capsule Take 1 capsule (300 mg total) by mouth 3 (three) times daily.   hydrALAZINE (APRESOLINE) 25 MG tablet Take 1.5 tablets (37.5 mg total) by mouth every 8 (eight) hours.    hydrOXYzine (ATARAX/VISTARIL) 25 MG tablet Take 25 mg by mouth every 8 (eight) hours as needed for anxiety.   pantoprazole (PROTONIX) 40 MG tablet Take 1 tablet (40 mg total) by mouth 2 (two) times daily. (Patient taking differently: Take 40 mg by mouth daily. )   polyethylene glycol powder (GLYCOLAX/MIRALAX) powder Take 17 g by mouth daily as needed.    potassium chloride SA (KLOR-CON M20) 20 MEQ tablet Take 1 tablet (20 mEq total) by mouth daily. (Patient taking differently: Take 20 mEq by mouth every other day. )   sildenafil (REVATIO) 20 MG tablet Take 1 tablet (20 mg total) by mouth 3 (three) times daily.   torsemide (DEMADEX) 20 MG tablet Take 1 tablet (20 mg total) by mouth daily for 30 days. Reports that he is taking every other day (Patient taking differently: Take 20 mg by mouth daily. Reports that he is taking every other day as needed)   warfarin (COUMADIN) 5 MG tablet Take 5-7.5 mg by mouth  See admin instructions. Take 7.5 mg on MON WED FRI, take 5mg  on all other days.     Allergies:   Patient has no known allergies.   Social History   Tobacco Use   Smoking status: Current Every Day Smoker    Packs/day: 0.50    Years: 25.00    Pack years: 12.50    Types: Cigarettes   Smokeless tobacco: Never Used   Tobacco comment: quit 1 week  Substance Use Topics   Alcohol use: Yes    Comment: occas   Drug use: No     Family Hx: The patient's family history includes Hypertension in his father.  ROS:   Please see the history of present illness.     All other systems reviewed and are negative.   Labs/Other Tests and Data Reviewed:    Recent Labs: 09/13/2017: B Natriuretic Peptide 233.6 08/21/2018: ALT 25 08/24/2018: Magnesium 2.1 08/28/2018: BUN 28; Creatinine, Ser 1.77; Hemoglobin 9.3; Platelets 315; Potassium 3.8; Sodium 136   Recent Lipid Panel Lab Results  Component Value Date/Time   CHOL 111 08/30/2017 12:37 PM   TRIG 38 08/30/2017 12:37 PM   HDL 33 (L)  08/30/2017 12:37 PM   CHOLHDL 3.4 08/30/2017 12:37 PM   LDLCALC 70 08/30/2017 12:37 PM    Wt Readings from Last 3 Encounters:  09/07/18 221 lb (100.2 kg)  09/06/18 221 lb (100.2 kg)  08/28/18 221 lb 9 oz (100.5 kg)     Objective:    Vital Signs:  BP 132/64 Comment: Patient stated his BP from previous day   Ht 6\' 3"  (1.905 m)    Wt 221 lb (100.2 kg) Comment: Patient stated his weight from previous day   BMI 27.62 kg/m    CONSTITUTIONAL:  Well nourished, well developed male in no acute distress.  EYES: anicteric MOUTH: oral mucosa is pink RESPIRATORY: Normal respiratory effort, symmetric expansion CARDIOVASCULAR: No peripheral edema SKIN: No rash, lesions or ulcers MUSCULOSKELETAL: no digital cyanosis NEURO: Cranial Nerves II-XII grossly intact, moves all extremities PSYCH: Intact judgement and insight.  A&O x 3, Mood/affect appropriate 27  ASSESSMENT & PLAN:    1.  OSA - the patient is tolerating PAP therapy well without any problems. The PAP download was reviewed today and showed an AHI of 7/hr on 12 cm H2O with 17% compliance in using more than 4 hours nightly.  The patient has been using and benefiting from PAP use and will continue to benefit from therapy. I encouraged him to avoid sleeping supine.  I also encouraged him to be more compliant with his device.  The main reason he has not been compliant is because he feels that his mask is very bulky and vague and he does not like it.  He also took it apart although not sure why and now cannot get it back together.   I am going to order him a [employer]ics DreamWear under the nose fullface mask to see if that will be a better fit for him.  I am going to recheck a download in 4 weeks.  I really stressed to him the importance of him using the CPAP all night given that his sleep apnea was so severe and he is responded beautifully to the current CPAP setting.  He also knows that he is not compliant in using more than 4 hours a night 70% the  time his insurance company will take it from them.  2.  COVID-19 Education:The signs and symptoms of COVID-19 were discussed  with the patient and how to seek care for testing (follow up with PCP or arrange E-visit).  The importance of social distancing was discussed today.  Patient Risk:   After full review of this patient's clinical status, I feel that they are at least moderate risk at this time.  Time:   Today, I have spent 15 minutes directly with the patient on video discussing medical problems including OSA, sleep hygiene, exercise.  We also reviewed the symptoms of COVID 19 and the ways to protect against contracting the virus with telehealth technology.  I spent an additional 5 minutes reviewing patient's chart including PAP compliance report.  Medication Adjustments/Labs and Tests Ordered: Current medicines are reviewed at length with the patient today.  Concerns regarding medicines are outlined above.  Tests Ordered: No orders of the defined types were placed in this encounter.  Medication Changes: No orders of the defined types were placed in this encounter.   Disposition:  Follow up in 1 year(s)  Signed, Armanda Magic, MD  09/07/2018 3:30 PM    [facility]

## 2018-09-06 NOTE — Progress Notes (Signed)
Heart Failure TeleHealth Note  Due to national recommendations of social distancing due to COVID 19, Audio/video telehealth visit is felt to be most appropriate for this patient at this time.  See MyChart message from today for patient consent regarding telehealth for [facility].  Date:  09/06/2018   ID:  Jonathan Wood, DOB 06-21-1970, MRN [id number]  Location: Home  Provider location: [facility] Type of Visit: Established patient  PCP: [name]  Cardiologist:  Arvilla Meres, MD Primary HF: Dr [name]   Chief Complaint: Heart Failure/LVAD  History of Present Illness: Jonathan Wood is a 48 y.o. 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  Admitted 08/21/2018 with volume overload and anemia. Diuresed with IV lasix and transitioned to torsemide 20 mg daily. GI consulted EGD completed and showed gastritis and nonbleeding duodenal ulcers, colonoscopy with 5 polyps removed. Protonix was increased to BID, he was started on PO iron, and hydrocortisone suppositories for hemorrhoid treatment. He received a total of 2 uPRBCs. He was bridged with heparin drip and coumadin without further bleeding. He will stay off ASA for several weeks but can reconsider later. He will continue daily hydrocortisone suppositories nightly for a total of 2 weeks + stool softeners twice a day. Discharge weight was 221 pounds.   He presents via Web designer for a telehealth visit today. Overall feeling fine. Denies SOB/PND/Orthopnea. Appetite ok. No fever or chills. Weight at 221 home pounds. No BRBPR. He has been taking an extra torsemide every other day.  He has not been taking anusol. He has been taking protonix once daily instead of twice daily. He continued to take entresto despite being stopped in the hospital.      He denies symptoms worrisome for COVID 19.   LVAD  Interrogation HM III Speed:  5650 Flow: 4.8 PI: 3.8 Power: 4.2 . No alarms   Past Medical History:  Diagnosis Date  . Asthma   . CHF (congestive heart failure) (HCC)    a. 09/2016: EF 20-25% with cath showing normal cors  . GERD (gastroesophageal reflux disease)   . History of hiatal hernia    Past Surgical History:  Procedure Laterality Date  . BIOPSY  08/23/2018   Procedure: BIOPSY;  Surgeon: Meridee Score Netty Starring., MD;  Location: [facility] ENDOSCOPY;  Service: Gastroenterology;;  . COLONOSCOPY N/A 08/23/2018   Procedure: COLONOSCOPY;  Surgeon: Lemar Lofty., MD;  Location: [facility] ENDOSCOPY;  Service: Gastroenterology;  Laterality: N/A;  . ENTEROSCOPY N/A 08/23/2018   Procedure: ENTEROSCOPY;  Surgeon: Meridee Score Netty Starring., MD;  Location: [facility] ENDOSCOPY;  Service: Gastroenterology;  Laterality: N/A;  . HEMOSTASIS CLIP PLACEMENT  08/23/2018   Procedure: HEMOSTASIS CLIP PLACEMENT;  Surgeon: Lemar Lofty., MD;  Location: [facility] ENDOSCOPY;  Service: Gastroenterology;;  . IABP INSERTION N/A 09/05/2017   Procedure: IABP INSERTION;  Surgeon: Dolores Patty, MD;  Location: [facility] INVASIVE CV LAB;  Service: Cardiovascular;  Laterality: N/A;  . INSERTION OF IMPLANTABLE LEFT VENTRICULAR ASSIST DEVICE N/A 09/06/2017   Procedure: INSERTION OF IMPLANTABLE LEFT VENTRICULAR ASSIST DEVICE - HM3;  Surgeon: Kerin Perna, MD;  Location: [facility] OR;  Service: Open Heart Surgery;  Laterality: N/A;  HM3  . NASAL FRACTURE SURGERY  1987  . POLYPECTOMY  08/23/2018   Procedure: POLYPECTOMY;  Surgeon: Mansouraty, Netty Starring., MD;  Location: [facility] ENDOSCOPY;  Service: Gastroenterology;;  . RIGHT HEART CATH N/A  08/30/2017   Procedure: RIGHT HEART CATH;  Surgeon: Dolores PattyBensimhon, Daniel R, MD;  [facility];  Service: Cardiovascular;  Laterality: N/A;  . RIGHT/LEFT HEART CATH AND CORONARY ANGIOGRAPHY N/A 10/08/2016   Procedure: Right/Left Heart Cath and Coronary Angiography;  Surgeon: Orpah CobbKadakia, Ajay, MD;  Location:  [facility] INVASIVE CV LAB;  Service: Cardiovascular;  Laterality: N/A;  . SUBMUCOSAL TATTOO INJECTION  08/23/2018   Procedure: SUBMUCOSAL TATTOO INJECTION;  Surgeon: Lemar LoftyMansouraty, Gabriel Jr., MD;  [facility];  Service: Gastroenterology;;  . TEE WITHOUT CARDIOVERSION N/A 09/06/2017   Procedure: TRANSESOPHAGEAL ECHOCARDIOGRAM (TEE);  Surgeon: Donata ClayVan Trigt, Theron AristaPeter, MD;  [facility];  Service: Open Heart Surgery;  Laterality: N/A;     Current Outpatient Medications  Medication Sig Dispense Refill  . amiodarone (PACERONE) 200 MG tablet Take 1 tablet (200 mg total) by mouth daily. 30 tablet 6  . citalopram (CELEXA) 10 MG tablet TAKE 1 TABLET BY MOUTH ONCE DAILY (Patient taking differently: Take 10 mg by mouth daily. ) 90 tablet 0  . docusate sodium (COLACE) 100 MG capsule Take 1 capsule (100 mg total) by mouth 2 (two) times daily. 10 capsule 2  . ferrous sulfate 325 (65 FE) MG tablet Take 1 tablet (325 mg total) by mouth daily with breakfast. 30 tablet 3  . gabapentin (NEURONTIN) 300 MG capsule Take 1 capsule (300 mg total) by mouth 3 (three) times daily. 90 capsule 6  . hydrALAZINE (APRESOLINE) 25 MG tablet Take 1.5 tablets (37.5 mg total) by mouth every 8 (eight) hours. 135 tablet 6  . hydrocortisone (ANUSOL-HC) 25 MG suppository Place 1 suppository (25 mg total) rectally at bedtime. 9 suppository 0  . hydrOXYzine (ATARAX/VISTARIL) 25 MG tablet Take 25 mg by mouth every 8 (eight) hours as needed for anxiety.    . pantoprazole (PROTONIX) 40 MG tablet Take 1 tablet (40 mg total) by mouth 2 (two) times daily. 60 tablet 6  . polyethylene glycol powder (GLYCOLAX/MIRALAX) powder Take 17 g by mouth 2 (two) times daily.    . potassium chloride SA (KLOR-CON M20) 20 MEQ tablet Take 1 tablet (20 mEq total) by mouth daily. (Patient not taking: Reported on 06/05/2018) 90 tablet 3  . sildenafil (REVATIO) 20 MG tablet Take 1 tablet (20 mg total) by mouth 3 (three) times daily. 90 tablet 3  . torsemide (DEMADEX)  20 MG tablet Take 1 tablet (20 mg total) by mouth daily for 30 days. Reports that he is taking every other day 30 tablet 3  . traZODone (DESYREL) 50 MG tablet Take 1 tablet (50 mg total) by mouth at bedtime. (Patient not taking: Reported on 08/21/2018) 30 tablet 4  . warfarin (COUMADIN) 5 MG tablet Take 5-7.5 mg by mouth See admin instructions. Take 7.5 mg on MON WED FRI, take 5mg  on all other days.    Marland Kitchen. zolpidem (AMBIEN) 10 MG tablet Take 1 tablet (10 mg total) by mouth at bedtime as needed for up to 30 days for sleep. (Patient taking differently: Take 10-20 mg by mouth at bedtime as needed for sleep. ) 30 tablet 0   No current facility-administered medications for this encounter.     Allergies:   Patient has no known allergies.   Social History:  The patient  reports that he has been smoking cigarettes. He has a 12.50 pack-year smoking history. He has never used smokeless tobacco. He reports current alcohol use. He reports that he does not use drugs.   Family History:  The patient's family history  includes Hypertension in his father.   ROS:  Please see the history of present illness.   All other systems are personally reviewed and negative.   Exam:  Tele Health Call; Exam is subjective  General:  Speaks in full sentences. No resp difficulty. Lungs: Normal respiratory effort with conversation.  Abdomen: Non-distended per patient report. Driveline dressing intact.  Extremities: Pt denies edema. Neuro: Alert & oriented x 3.   Recent Labs: 09/13/2017: B Natriuretic Peptide 233.6 08/21/2018: ALT 25 08/24/2018: Magnesium 2.1 08/28/2018: BUN 28; Creatinine, Ser 1.77; Hemoglobin 9.3; Platelets 315; Potassium 3.8; Sodium 136  Personally reviewed   Wt Readings from Last 3 Encounters:  08/28/18 100.5 kg (221 lb 9 oz)  06/05/18 102.1 kg (225 lb)  04/24/18 104.6 kg (230 lb 9.6 oz)      ASSESSMENT AND PLAN:  1. Chronic systolic HF - EF 44% s/p HM-3 LVAD on 09/06/17  NYHA I symptoms on VAD support   - Continue torsemide 20 mg daily with extra as needed.  - I have asked him to stop entresto. This was stopped in his recent hospitalization due to AKI. - Continue hydralazine currently. He is not on imdur due to sildenafil.  - Have begun referral process to [facility] for transplant. He continues to smoke. Pt aware he needs 6 months no smoking for transplant consideration.   2. HM3 LVAD 09/06/2017.  -VAD interrogated personally. Parameters stable.. - Driveline site improved with improved anchoring. - LDH check on Friday  - INR goal 2.0-2.5.Check INR on Friday .  - Keep off asa with recent ulcers.     3. Hypokalemia Check BMET on Friday  4. Anxiety - Continue Celexa.    - Ok to use trazadone for sleep  5. Essential HTN  6. OSA - recently sleep study with very severe OSA (AHI 69/hr) - having trouble with mask but will f/u with Dr. Mayford Knife  7. GI bleed  08/2018 EGD completed and showed gastritis and nonbleeding duodenal ulcers, colonoscopy with 5 polyps removed. Protonix was increased to BID.  He will stay off aspirin for now.  He has not been taking hydrocortisone or stool softeners.   8. Tobacco Abuse  Discussed smoking cessation.    COVID screen The patient does not have any symptoms that suggest any further testing/ screening at this time.  Social distancing reinforced today.  Patient Risk: After full review of this patients clinical status, I feel that they are at moderate risk for cardiac decompensation at this time.  Relevant cardiac medications were reviewed at length with the patient today. The patient does not have concerns regarding their medications at this time.   The following changes were made today:   Recommended follow-up:  Stop taking entresto. Check BMET, CBC, LDH, and INR on Friday at [facility].   Today, I have spent 23 minutes with the patient with telehealth technology discussing the above issues .    Waneta Martins, NP  09/06/2018 10:02 AM  [facility] [address] [facility] [place] [address] [phone] (office) [phone] (fax)

## 2018-09-06 NOTE — Addendum Note (Signed)
Encounter addended by: Lebron Quam, RN on: 09/06/2018 10:54 AM  Actions taken: Order Reconciliation Section accessed

## 2018-09-06 NOTE — Telephone Encounter (Signed)
Sherryll Burger  was discontinued on the discharge summary last week due to AKI.   I discussed with him today. He is supposed to be taking hydralazine and should be continued.      Thanks Amy

## 2018-09-07 ENCOUNTER — Encounter: Payer: Self-pay | Admitting: Cardiology

## 2018-09-07 ENCOUNTER — Other Ambulatory Visit: Payer: Self-pay

## 2018-09-07 ENCOUNTER — Telehealth (INDEPENDENT_AMBULATORY_CARE_PROVIDER_SITE_OTHER): Payer: Medicaid Other | Admitting: Cardiology

## 2018-09-07 VITALS — BP 132/64 | Ht 75.0 in | Wt 221.0 lb

## 2018-09-07 DIAGNOSIS — Z7189 Other specified counseling: Secondary | ICD-10-CM

## 2018-09-07 DIAGNOSIS — G4733 Obstructive sleep apnea (adult) (pediatric): Secondary | ICD-10-CM

## 2018-09-07 DIAGNOSIS — Z9989 Dependence on other enabling machines and devices: Secondary | ICD-10-CM | POA: Diagnosis not present

## 2018-09-07 NOTE — Patient Instructions (Signed)
Medication Instructions:  Your physician recommends that you continue on your current medications as directed. Please refer to the Current Medication list given to you today.  If you need a refill on your cardiac medications before your next appointment, please call your pharmacy.   Lab work: None If you have labs (blood work) drawn today and your tests are completely normal, you will receive your results only by: Marland Kitchen MyChart Message (if you have MyChart) OR . A paper copy in the mail If you have any lab test that is abnormal or we need to change your treatment, we will call you to review the results.  Testing/Procedures: None  Follow-Up: At [facility], you and your health needs are our priority.  As part of our continuing mission to provide you with exceptional heart care, we have created designated Provider Care Teams.  These Care Teams include your primary Cardiologist (physician) and Advanced Practice Providers (APPs -  Physician Assistants and Nurse Practitioners) who all work together to provide you with the care you need, when you need it. You will need a follow up appointment in 1 years.  Please call our office 2 months in advance to schedule this appointment.  You may see Dr. Mayford Knife  or one of the following Advanced Practice Providers on your designated Care Team:   Thrall, PA-C Ronie Spies, PA-C . Jacolyn Reedy, PA-C  Respironics Dreamwear under the nose full face mask and 4 week download.

## 2018-09-08 ENCOUNTER — Telehealth (HOSPITAL_COMMUNITY): Payer: Self-pay | Admitting: Licensed Clinical Social Worker

## 2018-09-08 ENCOUNTER — Ambulatory Visit (HOSPITAL_COMMUNITY): Payer: Self-pay | Admitting: Pharmacist

## 2018-09-08 ENCOUNTER — Other Ambulatory Visit: Payer: Self-pay

## 2018-09-08 ENCOUNTER — Encounter (HOSPITAL_COMMUNITY)
Admission: RE | Admit: 2018-09-08 | Discharge: 2018-09-08 | Disposition: A | Discharge status: Home / Self Care | Payer: Medicaid Other | Source: Ambulatory Visit | Attending: Internal Medicine | Admitting: Internal Medicine

## 2018-09-08 ENCOUNTER — Telehealth: Payer: Self-pay | Admitting: *Deleted

## 2018-09-08 DIAGNOSIS — Z95811 Presence of heart assist device: Secondary | ICD-10-CM

## 2018-09-08 LAB — BASIC METABOLIC PANEL
Anion gap: 9 (ref 5–15)
BUN: 22 mg/dL — ABNORMAL HIGH (ref 6–20)
CO2: 24 mmol/L (ref 22–32)
Calcium: 9 mg/dL (ref 8.9–10.3)
Chloride: 104 mmol/L (ref 98–111)
Creatinine, Ser: 1.41 mg/dL — ABNORMAL HIGH (ref 0.61–1.24)
GFR calc Af Amer: >60 mL/min (ref >60)
GFR calc non Af Amer: 60 mL/min — ABNORMAL LOW (ref >60)
Glucose, Bld: 124 mg/dL — ABNORMAL HIGH (ref 70–99)
Potassium: 3.9 mmol/L (ref 3.5–5.1)
Sodium: 137 mmol/L (ref 135–145)

## 2018-09-08 LAB — CBC
HCT: 31 % — ABNORMAL LOW (ref 39.0–52.0)
Hemoglobin: 9.1 g/dL — ABNORMAL LOW (ref 13.0–17.0)
MCH: 24.7 pg — ABNORMAL LOW (ref 26.0–34.0)
MCHC: 29.4 g/dL — ABNORMAL LOW (ref 30.0–36.0)
MCV: 84 fL (ref 80.0–100.0)
Platelets: 241 10*3/uL (ref 150–400)
RBC: 3.69 MIL/uL — ABNORMAL LOW (ref 4.22–5.81)
RDW: 19.8 % — ABNORMAL HIGH (ref 11.5–15.5)
WBC: 8.1 10*3/uL (ref 4.0–10.5)
nRBC: 0 % (ref 0.0–0.2)

## 2018-09-08 LAB — LACTATE DEHYDROGENASE: LDH: 283 U/L — ABNORMAL HIGH (ref 98–192)

## 2018-09-08 LAB — PROTIME-INR
INR: 1.9 — ABNORMAL HIGH (ref 0.8–1.2)
Prothrombin Time: 21.6 seconds — ABNORMAL HIGH (ref 11.4–15.2)

## 2018-09-08 NOTE — Telephone Encounter (Signed)
CSW contacted patient to follow up on weekly food delivery package. Patient informed of delivery time and no face to face contact during delivery. Patient verbalizes understanding and grateful for the assistance.  CSW continues to follow for supportive needs. Jackie Winston Sobczyk, LCSW, CCSW-MCS [phone]  

## 2018-09-08 NOTE — Telephone Encounter (Signed)
-----   Message from Dustin Flock, RN sent at 09/07/2018  4:59 PM EDT ----- Regarding: FW: followup  ----- Message ----- From: Quintella Reichert, MD Sent: 09/07/2018   3:30 PM EDT To: Dustin Flock, RN Subject: followup                                       Please order a Respironics DreamWear under the nose fullface mask.  Get a repeat download in 4 weeks.  Follow-up with me in 1 year.

## 2018-09-08 NOTE — Telephone Encounter (Signed)
Order faxed to [facility]

## 2018-09-13 ENCOUNTER — Telehealth (HOSPITAL_COMMUNITY): Payer: Self-pay | Admitting: *Deleted

## 2018-09-13 NOTE — Telephone Encounter (Signed)
Pt called VAD pager this am to report he hasn't slept for last two nights. Report he is having "panic attacks" and gets SOB with anxiety. VAD parameters wnl, no VAD alarms, denies wt gain, peripheral edema, abdominal distention. Per Tonye Becket, NP advised patient to call his PCP for anxiety and sleep issues, pt verbalized agreement to same.  Hessie Diener RN, VAD Coordinator [phone]

## 2018-09-15 ENCOUNTER — Telehealth (HOSPITAL_COMMUNITY): Payer: Self-pay | Admitting: Unknown Physician Specialty

## 2018-09-15 ENCOUNTER — Telehealth (HOSPITAL_COMMUNITY): Payer: Self-pay | Admitting: Licensed Clinical Social Worker

## 2018-09-15 MED ORDER — DOCUSATE SODIUM 100 MG PO CAPS: 100.0000 mg | ORAL_CAPSULE | Freq: Two times a day (BID) | ORAL | 2 refills | Status: DC

## 2018-09-15 NOTE — Telephone Encounter (Signed)
CSW contacted patient to follow up on weekly food delivery package. Patient informed of delivery time and no face to face contact during delivery. Patient verbalizes understanding and grateful for the assistance.  CSW continues to follow for supportive needs. Jackie Madix Blowe, LCSW, CCSW-MCS [phone]  

## 2018-09-15 NOTE — Telephone Encounter (Signed)
Received page from the pt stating that he feels like his belly is tight. He states that his weight is unchanging and that he is taking his fluid pills as prescribed and making adequate urine. Pt denies any SOB, swelling in feet/legs or other symptoms. Pt states that he is not having BMs. Pt states that when he is able to go it is a very small amount. Pt states that he needs a refill on his Colace and has not been taking Miralax. I instructed pt to take both today and daily thereafter to aid with constipation. Pt verbalized understanding. I will send a refill of Colace for the pt. Dr. [name] notified.  Carlton Adam RN, BSN VAD Coordinator 24/7 Pager [phone]

## 2018-09-20 ENCOUNTER — Telehealth (HOSPITAL_COMMUNITY): Payer: Self-pay | Admitting: Unknown Physician Specialty

## 2018-09-20 ENCOUNTER — Telehealth (HOSPITAL_COMMUNITY): Payer: Self-pay | Admitting: *Deleted

## 2018-09-20 ENCOUNTER — Encounter (HOSPITAL_COMMUNITY): Payer: Self-pay | Admitting: *Deleted

## 2018-09-20 ENCOUNTER — Ambulatory Visit (HOSPITAL_COMMUNITY)
Admission: RE | Admit: 2018-09-20 | Discharge: 2018-09-20 | Disposition: A | Discharge status: Home / Self Care | Payer: Medicaid Other | Source: Ambulatory Visit | Attending: Internal Medicine | Admitting: Internal Medicine

## 2018-09-20 ENCOUNTER — Other Ambulatory Visit: Payer: Self-pay

## 2018-09-20 ENCOUNTER — Other Ambulatory Visit (HOSPITAL_COMMUNITY): Payer: Self-pay | Admitting: *Deleted

## 2018-09-20 ENCOUNTER — Encounter (HOSPITAL_COMMUNITY): Payer: Self-pay

## 2018-09-20 VITALS — Wt 234.0 lb

## 2018-09-20 DIAGNOSIS — Z7901 Long term (current) use of anticoagulants: Secondary | ICD-10-CM

## 2018-09-20 DIAGNOSIS — Z95811 Presence of heart assist device: Secondary | ICD-10-CM | POA: Diagnosis not present

## 2018-09-20 DIAGNOSIS — Z9989 Dependence on other enabling machines and devices: Secondary | ICD-10-CM

## 2018-09-20 DIAGNOSIS — G4733 Obstructive sleep apnea (adult) (pediatric): Secondary | ICD-10-CM

## 2018-09-20 DIAGNOSIS — I5042 Chronic combined systolic (congestive) and diastolic (congestive) heart failure: Secondary | ICD-10-CM

## 2018-09-20 DIAGNOSIS — D649 Anemia, unspecified: Secondary | ICD-10-CM

## 2018-09-20 DIAGNOSIS — K5900 Constipation, unspecified: Secondary | ICD-10-CM

## 2018-09-20 MED ORDER — CITALOPRAM HYDROBROMIDE 10 MG PO TABS: 10.0000 mg | ORAL_TABLET | Freq: Every day | ORAL | 5 refills | Status: DC

## 2018-09-20 MED ORDER — SORBITOL 70 % PO SOLN: 15.0000 mL | Freq: Every day | ORAL | 0 refills | Status: DC | PRN

## 2018-09-20 MED ORDER — MAGNESIUM CITRATE PO SOLN: 1.0000 | Freq: Once | ORAL | 3 refills | Status: AC

## 2018-09-20 MED ORDER — SILDENAFIL CITRATE 20 MG PO TABS: 20.0000 mg | ORAL_TABLET | Freq: Three times a day (TID) | ORAL | 5 refills | Status: DC

## 2018-09-20 NOTE — Telephone Encounter (Signed)
Received page from pt stating that he needed a prescription for Mag Citrate and Sorbitol that he was told to use for constipation in his virtual visit today. Scripts provided.  Carlton Adam RN, BSN VAD Coordinator 24/7 Pager [phone]

## 2018-09-20 NOTE — Progress Notes (Signed)
Heart Failure TeleHealth Note  Due to national recommendations of social distancing due to COVID 19, Audio/video telehealth visit is felt to be most appropriate for this patient at this time.  See MyChart message from today for patient consent regarding telehealth for [facility].  Date:  09/20/2018   ID:  LAFAYETTE DUNLEVY, DOB August 20, 1970, MRN [id number]  Location: Home  Provider location: [facility] Type of Visit: Established patient   PCP:  Patient, No Pcp Per  Cardiologist:  Arvilla Meres, MD Primary HF: Dr Leory Plowman  Chief Complaint: Heart Failure   History of Present Illness: Jonathan Wood is a 48 y.o. 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  Admitted 08/21/2018 with volume overload and anemia. Diuresed with IV lasix and transitioned to torsemide 20 mg daily. GI consulted EGD completed and showed gastritis and nonbleeding duodenal ulcers, colonoscopy with 5 polyps removed. Protonix was increased to BID, he was started on PO iron,and hydrocortisone suppositories for hemorrhoid treatment. He received a total of 2 uPRBCs. He was bridged with heparin drip and coumadin without further bleeding. He will stay off ASA forseveral weeksbut can reconsider later. He will continue daily hydrocortisone suppositories nightly for a total of 2 weeks + stool softeners twice a day. Discharge weight was 221 pounds.   He presents via Web designer for a telehealth visit today. On 09/06/18 he was instructed to stop entresto and aspirin. Overall feeling fine. Denies PND/Orthopnea.Mild SOB with exertion.  No BRBPR. Complaining of constipation. Appetite ok. No fever or chills. Weight at home  234 pounds. Taking all medications. He has not alcohol or cigarettes in 4 weeks.   he denies symptoms worrisome for COVID 19.   LVAD Interrogation HM 3: Speed: 5600 Flow:4.7  PI: 4.7 Power: 4.1    Past Medical History:  Diagnosis Date   Asthma    CHF (congestive heart failure) (HCC)    a. 09/2016: EF 20-25% with cath showing normal cors   GERD (gastroesophageal reflux disease)    History of hiatal hernia    OSA on CPAP 09/06/2018   Severe OSA with AHI 68/hr on CPAP at 12cm H2O   Past Surgical History:  Procedure Laterality Date   BIOPSY  08/23/2018   Procedure: BIOPSY;  Surgeon: Lemar Lofty., MD;  Location: [facility] ENDOSCOPY;  Service: Gastroenterology;;   COLONOSCOPY N/A 08/23/2018   Procedure: COLONOSCOPY;  Surgeon: Lemar Lofty., MD;  Location: [facility] ENDOSCOPY;  Service: Gastroenterology;  Laterality: N/A;   ENTEROSCOPY N/A 08/23/2018   Procedure: ENTEROSCOPY;  Surgeon: Meridee Score Netty Starring., MD;  Location: [facility] ENDOSCOPY;  Service: Gastroenterology;  Laterality: N/A;   HEMOSTASIS CLIP PLACEMENT  08/23/2018   Procedure: HEMOSTASIS CLIP PLACEMENT;  Surgeon: Lemar Lofty., MD;  Location: [facility] ENDOSCOPY;  Service: Gastroenterology;;   IABP INSERTION N/A 09/05/2017   Procedure: IABP INSERTION;  Surgeon: Dolores Patty, MD;  Location: [facility] INVASIVE CV LAB;  Service: Cardiovascular;  Laterality: N/A;   INSERTION OF IMPLANTABLE LEFT VENTRICULAR ASSIST DEVICE N/A 09/06/2017   Procedure: INSERTION OF IMPLANTABLE LEFT VENTRICULAR ASSIST DEVICE - HM3;  Surgeon: Kerin Perna, MD;  Location: [facility] OR;  Service: Open Heart Surgery;  Laterality: N/A;  HM3   NASAL FRACTURE SURGERY  1987   POLYPECTOMY  08/23/2018   Procedure: POLYPECTOMY;  Surgeon: Mansouraty, Netty Starring., MD;  Location: [facility] ENDOSCOPY;  Service: Gastroenterology;;   RIGHT  HEART CATH N/A 08/30/2017   Procedure: RIGHT HEART CATH;  Surgeon: Dolores Patty, MD;  Location: [facility] INVASIVE CV LAB;  Service: Cardiovascular;  Laterality: N/A;   RIGHT/LEFT HEART CATH AND CORONARY ANGIOGRAPHY N/A 10/08/2016   Procedure: Right/Left Heart Cath and Coronary Angiography;  Surgeon: Orpah Cobb, MD;  Location: [facility] INVASIVE CV LAB;  Service: Cardiovascular;  Laterality: N/A;   SUBMUCOSAL TATTOO INJECTION  08/23/2018   Procedure: SUBMUCOSAL TATTOO INJECTION;  Surgeon: Lemar Lofty., MD;  Location: [facility] ENDOSCOPY;  Service: Gastroenterology;;   TEE WITHOUT CARDIOVERSION N/A 09/06/2017   Procedure: TRANSESOPHAGEAL ECHOCARDIOGRAM (TEE);  Surgeon: Donata Clay, Theron Arista, MD;  Location: [facility] OR;  Service: Open Heart Surgery;  Laterality: N/A;     Current Outpatient Medications  Medication Sig Dispense Refill   amiodarone (PACERONE) 200 MG tablet Take 1 tablet (200 mg total) by mouth daily. 30 tablet 6   citalopram (CELEXA) 10 MG tablet TAKE 1 TABLET BY MOUTH ONCE DAILY (Patient taking differently: Take 10 mg by mouth daily. ) 90 tablet 0   docusate sodium (COLACE) 100 MG capsule Take 1 capsule (100 mg total) by mouth 2 (two) times daily. 10 capsule 2   ferrous sulfate 325 (65 FE) MG tablet Take 1 tablet (325 mg total) by mouth daily with breakfast. 30 tablet 3   gabapentin (NEURONTIN) 300 MG capsule Take 1 capsule (300 mg total) by mouth 3 (three) times daily. 90 capsule 6   hydrALAZINE (APRESOLINE) 25 MG tablet Take 1.5 tablets (37.5 mg total) by mouth every 8 (eight) hours. 135 tablet 6   hydrOXYzine (ATARAX/VISTARIL) 25 MG tablet Take 25 mg by mouth every 8 (eight) hours as needed for anxiety.     pantoprazole (PROTONIX) 40 MG tablet Take 1 tablet (40 mg total) by mouth 2 (two) times daily. (Patient taking differently: Take 40 mg by mouth daily. ) 60 tablet 6   polyethylene glycol powder (GLYCOLAX/MIRALAX) powder Take 17 g by mouth daily as needed.      potassium chloride SA (KLOR-CON M20) 20 MEQ tablet Take 1 tablet (20 mEq total) by mouth daily. (Patient taking differently: Take 20 mEq by mouth every other day. ) 90 tablet 3   sildenafil (REVATIO) 20 MG tablet Take 1 tablet (20 mg total) by mouth 3 (three) times daily. 90 tablet 3   torsemide (DEMADEX) 20 MG tablet Take 1  tablet (20 mg total) by mouth daily for 30 days. Reports that he is taking every other day (Patient taking differently: Take 20 mg by mouth daily. Reports that he is taking every other day as needed) 30 tablet 3   warfarin (COUMADIN) 5 MG tablet Take 5-7.5 mg by mouth See admin instructions. Take 7.5 mg on MON WED FRI, take 5mg  on all other days.     No current facility-administered medications for this encounter.     Allergies:   Patient has no known allergies.   Social History:  The patient  reports that he has been smoking cigarettes. He has a 12.50 pack-year smoking history. He has never used smokeless tobacco. He reports current alcohol use. He reports that he does not use drugs.   Family History:  The patient's family history includes Hypertension in his father.   ROS:  Please see the history of present illness.   All other systems are personally reviewed and negative.  Exam: Tele Health Call; Exam is subjective  General:  Speaks in full sentences. No resp difficulty. Lungs: Normal respiratory effort with conversation.  Abdomen: Non-distended per patient report Extremities: Pt denies edema. Neuro: Alert & oriented x 3.   Recent Labs: 08/21/2018: ALT 25 08/24/2018: Magnesium 2.1 09/08/2018: BUN 22; Creatinine, Ser 1.41; Hemoglobin 9.1; Platelets 241; Potassium 3.9; Sodium 137  Personally reviewed   Wt Readings from Last 3 Encounters:  09/07/18 100.2 kg (221 lb)  09/06/18 100.2 kg (221 lb)  08/28/18 100.5 kg (221 lb 9 oz)      ASSESSMENT AND PLAN:   1.Chronic systolic HF - EF 52% s/p HM-3 LVAD on 09/06/17 - NYHA I. Volume status stable. He can continue torsemide every other day.  - Keep off entresto for now. Continue hydralazine.  -  He is not on imdur due to sildenafil.  - Have begun referral process to [facility] for transplant. He stopped smoking 4 weeks ago. We will need to start checking cotinine monthly.  Pt aware he needs 6 months no smoking for transplant  consideration.  2. HM3 LVAD 09/06/2017. VAD Parameters stable.  - LDH last week was stable.  - Check INR on Friday at [facility].  - INR goal 2.0-2.5. - Keep off asa with recent ulcers.     3. Hypokalemia Check BMET on Friday   4. Anxiety - Continue Celexa.  Refill celexa.  - Ok to use trazadone for sleep  5. Essential HTN  6. OSA - recently sleep study with very severe OSA (AHI 69/hr) - having trouble with mask but will f/u with Dr. Mayford Knife. He needs follow up.   7. GI bleed  08/2018 EGD completed and showed gastritis and nonbleeding duodenal ulcers, colonoscopy with 5 polyps removed. Protonix was increased to BID. He is only taking once a day. I reinforced twice a day.  He will stay off aspirin for now.  He has not been taking hydrocortisone.  Continue stool softeners.   8. Tobacco Abuse  Discussed smoking cessation. Quit 4 weeks ago.  COVID screen The patient does not have any symptoms that suggest any further testing/ screening at this time.  Social distancing reinforced today.  Patient Risk: After full review of this patients clinical status, I feel that they are at moderate risk for cardiac decompensation at this time.  Relevant cardiac medications were reviewed at length with the patient today. The patient does not have concerns regarding their medications at this time.   The following changes were made today:  Refill celexa and sildenafil. Needs to colace twice a day and protonix twice a day. Check continine level next month he remains off cigarettes.  Recommended follow-up: 8 weeks  Today, I have spent 25 minutes with the patient with telehealth technology discussing the above issues .    Waneta Martins, NP  09/20/2018 10:12 AM  [facility] [address] [facility] [place] [address] [phone] (office) [phone] (fax)

## 2018-09-20 NOTE — Telephone Encounter (Signed)
1 year Intermacs follow up completed via telephone including:  Quality of Life, KCCQ-12.    6 minute walk and trail-making unable to be completed at this time due to COVID-19 restrictions.  Patient complaining of constipation. Currently taking Colace and Miralax BID with minimal results. Instructed patient to try Magnesium Citrate. Patient verbalized understanding. Tonye Becket NP aware.  Alyce Pagan RN VAD Coordinator  Office: [phone]  24/7 Pager: [phone]

## 2018-09-21 ENCOUNTER — Ambulatory Visit: Payer: Medicaid Other | Admitting: Gastroenterology

## 2018-09-21 ENCOUNTER — Other Ambulatory Visit: Payer: Self-pay

## 2018-09-21 ENCOUNTER — Ambulatory Visit (INDEPENDENT_AMBULATORY_CARE_PROVIDER_SITE_OTHER): Payer: Medicaid Other | Admitting: Gastroenterology

## 2018-09-21 DIAGNOSIS — D649 Anemia, unspecified: Secondary | ICD-10-CM

## 2018-09-21 DIAGNOSIS — K649 Unspecified hemorrhoids: Secondary | ICD-10-CM | POA: Diagnosis not present

## 2018-09-21 DIAGNOSIS — Z86010 Personal history of colonic polyps: Secondary | ICD-10-CM | POA: Diagnosis not present

## 2018-09-21 DIAGNOSIS — Z7901 Long term (current) use of anticoagulants: Secondary | ICD-10-CM

## 2018-09-21 MED ORDER — DOCUSATE SODIUM 100 MG PO CAPS: 100.0000 mg | ORAL_CAPSULE | Freq: Three times a day (TID) | ORAL | 2 refills | Status: DC

## 2018-09-21 NOTE — Progress Notes (Signed)
GASTROENTEROLOGY OUTPATIENT CLINIC VISIT   Primary Care Provider Patient, No Pcp Per No address on file None  Patient Profile: Jonathan Wood is a 48 y.o. male with a pmh significant for CHF status post LVAD, GERD, OSA, constipation, hemorrhoids, colon polyps (tubular adenomas).  The patient presents to the [facility] for an evaluation and management of problem(s) noted below:  Problem List 1. History of colonic polyps   2. Hemorrhoids, unspecified hemorrhoid type   3. Anemia, unspecified type   4. Long term current use of anticoagulant therapy     History of Present Illness I connected with  Jonathan SarStephone T Clipper on 09/24/18. I verified that I was speaking with the correct person using two identifiers. Due to the COVID-19 Pandemic, this service was provided via telemedicine using audiovisual media.   The patient was located at home. The provider was located in the office. The patient did consent to this visit and is aware of charges through their insurance as well as the limitations of evaluation and management by telemedicine. Other persons participating in this telemedicine service were none. Time spent on visit was greater than 25 minutes on discussion with the patient and coordination of care.  Please see initial consultation note for full details of HPI that was done during the patient's hospital admission.  In brief, this patient presented with bright red blood per rectum and anemia and some evidence of concern for occult blood loss.  He underwent an enteroscopy and colonoscopy with results as below.  He was found to have peptic ulcer disease as well as colon polyps as well as hemorrhoids.  Aggressive bowel regimen was utilized and the patient no longer had further rectal bleeding.  He was transfused during his hospitalization.  He is being seen for follow-up at this point in time.  The patient states he no longer has rectal bleeding.  His energy levels  are better.  He is taking his iron.  He is having constipation.  Right now he is taking Dulcolax twice daily as well as MiraLAX without significant improvement in his bowel movements.  He states that he is having bowel movements every other day.  Patient is planning to have labs performed tomorrow and the pannus normally does for follow-up in regards to his heart failure.  He denies any significant melanic stools.  The stools are formed.  Denies any abdominal pain.  He is taking his PPI and was previously taking it once a day and has been uptitrated back to twice daily.  GI Review of Systems Positive as above Negative for dysphagia, odynophagia, change in bowel habits, change in appetite, nausea, vomiting  Review of Systems General: Denies fevers/chills HEENT: Denies oral lesions Cardiovascular: Denies chest pain Pulmonary: Shortness of breath at baseline Gastroenterological: See HPI Genitourinary: Denies darkened urine or hematuria Hematological: Positive for easy bruising/bleeding Endocrine: Denies temperature intolerance Dermatological: Denies jaundice Psychological: Mood is stable   Medications Current Outpatient Medications  Medication Sig Dispense Refill   amiodarone (PACERONE) 200 MG tablet Take 1 tablet (200 mg total) by mouth daily. 30 tablet 6   citalopram (CELEXA) 10 MG tablet Take 1 tablet (10 mg total) by mouth daily. 90 tablet 5   docusate sodium (COLACE) 100 MG capsule Take 1 capsule (100 mg total) by mouth 3 (three) times daily. 90 capsule 2   ferrous sulfate 325 (65 FE) MG tablet Take 1 tablet (325 mg total) by mouth daily with breakfast. 30 tablet 3   gabapentin (NEURONTIN)  300 MG capsule Take 1 capsule (300 mg total) by mouth 3 (three) times daily. 90 capsule 6   hydrALAZINE (APRESOLINE) 25 MG tablet Take 1.5 tablets (37.5 mg total) by mouth every 8 (eight) hours. 135 tablet 6   hydrOXYzine (ATARAX/VISTARIL) 25 MG tablet Take 25 mg by mouth every 8 (eight) hours  as needed for anxiety.     pantoprazole (PROTONIX) 40 MG tablet Take 1 tablet (40 mg total) by mouth 2 (two) times daily. (Patient taking differently: Take 40 mg by mouth daily. ) 60 tablet 6   polyethylene glycol powder (GLYCOLAX/MIRALAX) powder Take 17 g by mouth daily as needed.      potassium chloride SA (KLOR-CON M20) 20 MEQ tablet Take 1 tablet (20 mEq total) by mouth daily. (Patient not taking: Reported on 09/20/2018) 90 tablet 3   sildenafil (REVATIO) 20 MG tablet Take 1 tablet (20 mg total) by mouth 3 (three) times daily. 90 tablet 5   sorbitol 70 % solution Take 15 mLs by mouth daily as needed. 473 mL 0   torsemide (DEMADEX) 20 MG tablet Take 1 tablet (20 mg total) by mouth daily for 30 days. Reports that he is taking every other day (Patient taking differently: Take 20 mg by mouth every other day. Reports that he is taking every other day as needed) 30 tablet 3   warfarin (COUMADIN) 5 MG tablet Take 5-7.5 mg by mouth See admin instructions. Take 7.5 mg on MON WED FRI, take 5mg  on all other days.     No current facility-administered medications for this visit.     Allergies No Known Allergies  Histories Past Medical History:  Diagnosis Date   Asthma    CHF (congestive heart failure) (HCC)    a. 09/2016: EF 20-25% with cath showing normal cors   GERD (gastroesophageal reflux disease)    History of hiatal hernia    OSA on CPAP 09/06/2018   Severe OSA with AHI 68/hr on CPAP at 12cm H2O   Past Surgical History:  Procedure Laterality Date   BIOPSY  08/23/2018   Procedure: BIOPSY;  Surgeon: Lemar Lofty., MD;  Location: [facility] ENDOSCOPY;  Service: Gastroenterology;;   COLONOSCOPY N/A 08/23/2018   Procedure: COLONOSCOPY;  Surgeon: Lemar Lofty., MD;  Location: [facility] ENDOSCOPY;  Service: Gastroenterology;  Laterality: N/A;   ENTEROSCOPY N/A 08/23/2018   Procedure: ENTEROSCOPY;  Surgeon: Meridee Score Netty Starring., MD;  Location: [facility] ENDOSCOPY;  Service:  Gastroenterology;  Laterality: N/A;   HEMOSTASIS CLIP PLACEMENT  08/23/2018   Procedure: HEMOSTASIS CLIP PLACEMENT;  Surgeon: Lemar Lofty., MD;  Location: [facility] ENDOSCOPY;  Service: Gastroenterology;;   IABP INSERTION N/A 09/05/2017   Procedure: IABP INSERTION;  Surgeon: Dolores Patty, MD;  Location: [facility] INVASIVE CV LAB;  Service: Cardiovascular;  Laterality: N/A;   INSERTION OF IMPLANTABLE LEFT VENTRICULAR ASSIST DEVICE N/A 09/06/2017   Procedure: INSERTION OF IMPLANTABLE LEFT VENTRICULAR ASSIST DEVICE - HM3;  Surgeon: Kerin Perna, MD;  Location: [facility] OR;  Service: Open Heart Surgery;  Laterality: N/A;  HM3   NASAL FRACTURE SURGERY  1987   POLYPECTOMY  08/23/2018   Procedure: POLYPECTOMY;  Surgeon: Mansouraty, Netty Starring., MD;  Location: [facility] ENDOSCOPY;  Service: Gastroenterology;;   RIGHT HEART CATH N/A 08/30/2017   Procedure: RIGHT HEART CATH;  Surgeon: Dolores Patty, MD;  [facility] INVASIVE CV LAB;  Service: Cardiovascular;  Laterality: N/A;   RIGHT/LEFT HEART CATH AND CORONARY ANGIOGRAPHY N/A 10/08/2016   Procedure: Right/Left Heart Cath and Coronary Angiography;  Surgeon: Orpah Cobb, MD;  Location: [facility] INVASIVE CV LAB;  Service: Cardiovascular;  Laterality: N/A;   SUBMUCOSAL TATTOO INJECTION  08/23/2018   Procedure: SUBMUCOSAL TATTOO INJECTION;  Surgeon: Lemar Lofty., MD;  Location: [facility] ENDOSCOPY;  Service: Gastroenterology;;   TEE WITHOUT CARDIOVERSION N/A 09/06/2017   Procedure: TRANSESOPHAGEAL ECHOCARDIOGRAM (TEE);  Surgeon: Donata Clay, Theron Arista, MD;  Location: [facility] OR;  Service: Open Heart Surgery;  Laterality: N/A;   Social History   Socioeconomic History   Marital status: Legally Separated    Spouse name: Not on file   Number of children: Not on file   Years of education: Not on file   Highest education level: Not on file  Occupational History   Not on file  Social Needs   Financial resource strain: Not on file   Food insecurity:    Worry:  Not on file    Inability: Not on file   Transportation needs:    Medical: Not on file    Non-medical: Not on file  Tobacco Use   Smoking status: Current Every Day Smoker    Packs/day: 0.50    Years: 25.00    Pack years: 12.50    Types: Cigarettes   Smokeless tobacco: Never Used   Tobacco comment: quit 1 week  Substance and Sexual Activity   Alcohol use: Yes    Comment: occas   Drug use: No   Sexual activity: Yes  Lifestyle   Physical activity:    Days per week: Not on file    Minutes per session: Not on file   Stress: Not on file  Relationships   Social connections:    Talks on phone: Not on file    Gets together: Not on file    Attends religious service: Not on file    Active member of club or organization: Not on file    Attends meetings of clubs or organizations: Not on file    Relationship status: Not on file   Intimate partner violence:    Fear of current or ex partner: Not on file    Emotionally abused: Not on file    Physically abused: Not on file    Forced sexual activity: Not on file  Other Topics Concern   Not on file  Social History Narrative   Not on file   Family History  Problem Relation Age of Onset   Hypertension Father    Colon cancer Neg Hx    Esophageal cancer Neg Hx    Inflammatory bowel disease Neg Hx    Liver disease Neg Hx    Pancreatic cancer Neg Hx    Rectal cancer Neg Hx    Stomach cancer Neg Hx    I have reviewed his medical, social, and family history in detail and updated the electronic medical record as necessary.    PHYSICAL EXAMINATION  Telemedicine visit   REVIEW OF DATA  I reviewed the following data at the time of this encounter:  GI Procedures and Studies  April 2020 SBE - No gross lesions in esophagus. - Z-line irregular, 40 cm from the incisors. - Small hiatal hernia. - Erythematous mucosa in the antrum. No other gross lesions in the stomach. Biopsied for HP. - Non-bleeding duodenal ulcers  and erosions with a clean base (Forrest Class III). - Normal mucosa was found in the second portion of the duodenum, in the third portion of the duodenum and in the fourth portion of the duodenum. - Normal mucosa was found in  the jejunum. Tattooed distal extent.  April 2020 colonoscopy - Non-thrombosed external hemorrhoids found on digital rectal exam. - Five 2 to 6 mm polyps at the recto-sigmoid colon and in the descending colon, removed with a cold snare. Resected and retrieved. Clip (MR conditional) was placed. - Normal mucosa in the entire examined colon otherwise. - Non-bleeding non-thrombosed external and internal hemorrhoids. Pathology consistent with combination of tubular adenomas and hyperplastic polyps with plan for 5-year follow-up  Laboratory Studies  Reviewed in epic  Imaging Studies  No relevant studies to review   ASSESSMENT  Mr. Aprea is a 48 y.o. male with a pmh significant for CHF status post LVAD, GERD, OSA, constipation, hemorrhoids, colon polyps (tubular adenomas).  The patient is seen today for evaluation and management of:  1. History of colonic polyps   2. Hemorrhoids, unspecified hemorrhoid type   3. Anemia, unspecified type   4. Long term current use of anticoagulant therapy    The patient is hemodynamically stable.  He is having persistent constipation issues.  No evidence of recurrent hematochezia however.  This is good news.  As the patient still having significant constipation we will adjust his bowel regimen as below.  I would like to also try and see if Linzess may help the patient as well and will give him samples for that.  We will follow-up in approximately 3 to 4 weeks to see how things are going.  We will get a blood count and iron indices to ensure the patient is no longer significantly iron deficient.  If he has iron deficiency he may require IV iron infusions.  We will keep his current iron dosing at once daily.  All patient questions were  answered, to the best of my ability, and the patient agrees to the aforementioned plan of action with follow-up as indicated.   PLAN  Increase Colace to 100 mg 3 times daily Continue MiraLAX twice daily No bowel movement after 2 days then initiate bisacodyl 10 mg once and if no bowel movement after 4 to 6 hours and may take a second dose of 10 mg of bisacodyl If no bowel movement after this initiation of magnesium citrate Samples for 145 mcg Linzess will be available for pickup next week Patient can initiate this and continue his Colace and MiraLAX and down titrate MiraLAX if he has good effect with this medication Follow-up in 3 to 4 weeks Laboratories as outlined below IV iron if necessary based on laboratories Continue p.o. iron once daily   Orders Placed This Encounter  Procedures   CBC   IBC + Ferritin   B12   Folate    New Prescriptions   No medications on file   Modified Medications   Modified Medication Previous Medication   DOCUSATE SODIUM (COLACE) 100 MG CAPSULE docusate sodium (COLACE) 100 MG capsule      Take 1 capsule (100 mg total) by mouth 3 (three) times daily.    Take 1 capsule (100 mg total) by mouth 2 (two) times daily.    Planned Follow Up No follow-ups on file.   Corliss Parish, MD  Gastroenterology Advanced Endoscopy Office # [phone]

## 2018-09-21 NOTE — Patient Instructions (Addendum)
Continue Docusate 100mg  three times daily.   Continue Miralax twice daily.   Bisacodyl 10mg  once daily if no bowel movement in 2 days.   If after all above regimen has not work, then use 1 bottle of Magnesium Citrate.   Thank you for choosing me and [facility].  Dr. Meridee Score

## 2018-09-22 ENCOUNTER — Other Ambulatory Visit: Payer: Self-pay

## 2018-09-22 ENCOUNTER — Other Ambulatory Visit (HOSPITAL_COMMUNITY)
Admission: RE | Admit: 2018-09-22 | Discharge: 2018-09-22 | Disposition: A | Discharge status: Home / Self Care | Payer: Medicaid Other | Source: Ambulatory Visit | Attending: Gastroenterology | Admitting: Gastroenterology

## 2018-09-22 ENCOUNTER — Ambulatory Visit (HOSPITAL_COMMUNITY): Payer: Self-pay | Admitting: Pharmacist

## 2018-09-22 ENCOUNTER — Encounter (HOSPITAL_COMMUNITY)
Admission: RE | Admit: 2018-09-22 | Discharge: 2018-09-22 | Disposition: A | Discharge status: Home / Self Care | Payer: Medicaid Other | Source: Ambulatory Visit | Attending: Internal Medicine | Admitting: Internal Medicine

## 2018-09-22 ENCOUNTER — Encounter: Payer: Self-pay | Admitting: Gastroenterology

## 2018-09-22 ENCOUNTER — Telehealth (HOSPITAL_COMMUNITY): Payer: Self-pay | Admitting: Licensed Clinical Social Worker

## 2018-09-22 DIAGNOSIS — Z95811 Presence of heart assist device: Secondary | ICD-10-CM

## 2018-09-22 DIAGNOSIS — K649 Unspecified hemorrhoids: Secondary | ICD-10-CM | POA: Diagnosis present

## 2018-09-22 DIAGNOSIS — Z86010 Personal history of colonic polyps: Secondary | ICD-10-CM | POA: Insufficient documentation

## 2018-09-22 LAB — VITAMIN B12: Vitamin B-12: 1009 pg/mL — ABNORMAL HIGH (ref 180–914)

## 2018-09-22 LAB — IRON AND TIBC
Iron: 13 ug/dL — ABNORMAL LOW (ref 45–182)
Saturation Ratios: 3 % — ABNORMAL LOW (ref 17.9–39.5)
TIBC: 440 ug/dL (ref 250–450)
UIBC: 427 ug/dL

## 2018-09-22 LAB — FOLATE: Folate: 12.7 ng/mL (ref >5.9)

## 2018-09-22 LAB — CBC
HCT: 30 % — ABNORMAL LOW (ref 39.0–52.0)
Hemoglobin: 8.6 g/dL — ABNORMAL LOW (ref 13.0–17.0)
MCH: 23.3 pg — ABNORMAL LOW (ref 26.0–34.0)
MCHC: 28.7 g/dL — ABNORMAL LOW (ref 30.0–36.0)
MCV: 81.3 fL (ref 80.0–100.0)
Platelets: 162 10*3/uL (ref 150–400)
RBC: 3.69 MIL/uL — ABNORMAL LOW (ref 4.22–5.81)
RDW: 19.4 % — ABNORMAL HIGH (ref 11.5–15.5)
WBC: 7.5 10*3/uL (ref 4.0–10.5)
nRBC: 0.3 % — ABNORMAL HIGH (ref 0.0–0.2)

## 2018-09-22 LAB — PROTIME-INR
INR: 2.3 — ABNORMAL HIGH (ref 0.8–1.2)
Prothrombin Time: 24.9 seconds — ABNORMAL HIGH (ref 11.4–15.2)

## 2018-09-22 LAB — FERRITIN: Ferritin: 16 ng/mL — ABNORMAL LOW (ref 24–336)

## 2018-09-22 NOTE — Telephone Encounter (Signed)
CSW contacted patient to follow up on weekly food delivery package. Patient informed of delivery time and no face to face contact during delivery. Patient verbalizes understanding and grateful for the assistance.  CSW continues to follow for supportive needs. Jackie Zaide Mcclenahan, LCSW, CCSW-MCS [phone]  

## 2018-09-24 DIAGNOSIS — Z86010 Personal history of colonic polyps: Secondary | ICD-10-CM | POA: Insufficient documentation

## 2018-09-24 DIAGNOSIS — K649 Unspecified hemorrhoids: Secondary | ICD-10-CM | POA: Insufficient documentation

## 2018-09-24 DIAGNOSIS — Z7901 Long term (current) use of anticoagulants: Secondary | ICD-10-CM | POA: Insufficient documentation

## 2018-09-25 ENCOUNTER — Other Ambulatory Visit: Payer: Self-pay

## 2018-09-25 ENCOUNTER — Telehealth: Payer: Self-pay | Admitting: Gastroenterology

## 2018-09-25 DIAGNOSIS — D649 Anemia, unspecified: Secondary | ICD-10-CM

## 2018-09-25 NOTE — Telephone Encounter (Signed)
The pt has been advised that he needs to have his infusion at a location that does IV iron.  He will keep the appt as already scheduled.

## 2018-09-29 ENCOUNTER — Other Ambulatory Visit (INDEPENDENT_AMBULATORY_CARE_PROVIDER_SITE_OTHER): Payer: Medicaid Other

## 2018-09-29 ENCOUNTER — Other Ambulatory Visit: Payer: Self-pay

## 2018-09-29 ENCOUNTER — Ambulatory Visit (HOSPITAL_COMMUNITY)
Admission: RE | Admit: 2018-09-29 | Discharge: 2018-09-29 | Disposition: A | Discharge status: Home / Self Care | Payer: Medicaid Other | Source: Ambulatory Visit | Attending: Gastroenterology | Admitting: Gastroenterology

## 2018-09-29 ENCOUNTER — Encounter (HOSPITAL_COMMUNITY)
Admission: RE | Admit: 2018-09-29 | Discharge: 2018-09-29 | Disposition: A | Discharge status: Home / Self Care | Payer: Medicaid Other | Source: Ambulatory Visit | Attending: Internal Medicine | Admitting: Internal Medicine

## 2018-09-29 ENCOUNTER — Telehealth (HOSPITAL_COMMUNITY): Payer: Self-pay | Admitting: Licensed Clinical Social Worker

## 2018-09-29 ENCOUNTER — Ambulatory Visit (HOSPITAL_COMMUNITY): Payer: Self-pay | Admitting: Pharmacist

## 2018-09-29 DIAGNOSIS — K649 Unspecified hemorrhoids: Secondary | ICD-10-CM

## 2018-09-29 DIAGNOSIS — D649 Anemia, unspecified: Secondary | ICD-10-CM

## 2018-09-29 DIAGNOSIS — Z86010 Personal history of colonic polyps: Secondary | ICD-10-CM | POA: Diagnosis not present

## 2018-09-29 DIAGNOSIS — Z7901 Long term (current) use of anticoagulants: Secondary | ICD-10-CM | POA: Diagnosis not present

## 2018-09-29 DIAGNOSIS — Z95811 Presence of heart assist device: Secondary | ICD-10-CM | POA: Diagnosis not present

## 2018-09-29 LAB — CBC WITH DIFFERENTIAL/PLATELET
Basophils Absolute: 0.1 10*3/uL (ref 0.0–0.1)
Basophils Relative: 1.3 % (ref 0.0–3.0)
Eosinophils Absolute: 0.1 10*3/uL (ref 0.0–0.7)
Eosinophils Relative: 1.2 % (ref 0.0–5.0)
HCT: 29.3 % — ABNORMAL LOW (ref 39.0–52.0)
Hemoglobin: 9.3 g/dL — ABNORMAL LOW (ref 13.0–17.0)
Lymphocytes Relative: 26.9 % (ref 12.0–46.0)
Lymphs Abs: 1.8 10*3/uL (ref 0.7–4.0)
MCHC: 31.7 g/dL (ref 30.0–36.0)
MCV: 74.9 fl — ABNORMAL LOW (ref 78.0–100.0)
Monocytes Absolute: 0.7 10*3/uL (ref 0.1–1.0)
Monocytes Relative: 9.8 % (ref 3.0–12.0)
Neutro Abs: 4.1 10*3/uL (ref 1.4–7.7)
Neutrophils Relative %: 60.8 % (ref 43.0–77.0)
Platelets: 215 10*3/uL (ref 150.0–400.0)
RBC: 3.91 Mil/uL — ABNORMAL LOW (ref 4.22–5.81)
RDW: 21.8 % — ABNORMAL HIGH (ref 11.5–15.5)
WBC: 6.8 10*3/uL (ref 4.0–10.5)

## 2018-09-29 LAB — IBC + FERRITIN
Ferritin: 17.8 ng/mL — ABNORMAL LOW (ref 22.0–322.0)
Iron: 345 ug/dL — ABNORMAL HIGH (ref 42–165)
Saturation Ratios: 79.8 % — ABNORMAL HIGH (ref 20.0–50.0)
Transferrin: 309 mg/dL (ref 212.0–360.0)

## 2018-09-29 LAB — CBC
HCT: 31.4 % — ABNORMAL LOW (ref 39.0–52.0)
Hemoglobin: 8.8 g/dL — ABNORMAL LOW (ref 13.0–17.0)
MCH: 22.8 pg — ABNORMAL LOW (ref 26.0–34.0)
MCHC: 28 g/dL — ABNORMAL LOW (ref 30.0–36.0)
MCV: 81.3 fL (ref 80.0–100.0)
Platelets: 226 10*3/uL (ref 150–400)
RBC: 3.86 MIL/uL — ABNORMAL LOW (ref 4.22–5.81)
RDW: 19.9 % — ABNORMAL HIGH (ref 11.5–15.5)
WBC: 6.3 10*3/uL (ref 4.0–10.5)
nRBC: 0.5 % — ABNORMAL HIGH (ref 0.0–0.2)

## 2018-09-29 LAB — VITAMIN B12: Vitamin B-12: 1071 pg/mL — ABNORMAL HIGH (ref 211–911)

## 2018-09-29 LAB — PROTIME-INR
INR: 2.2 — ABNORMAL HIGH (ref 0.8–1.2)
Prothrombin Time: 24.4 seconds — ABNORMAL HIGH (ref 11.4–15.2)

## 2018-09-29 LAB — FOLATE: Folate: 14.3 ng/mL (ref >5.9)

## 2018-09-29 MED ORDER — SODIUM CHLORIDE 0.9 % IV SOLN
INTRAVENOUS | Status: DC | PRN
  Administered 2018-09-29: 250 mL via INTRAVENOUS

## 2018-09-29 MED ORDER — SODIUM CHLORIDE 0.9 % IV SOLN
510.0000 mg | INTRAVENOUS | Status: DC
  Administered 2018-09-29: 510 mg via INTRAVENOUS
  Filled 2018-09-29: qty 17

## 2018-09-29 NOTE — Telephone Encounter (Signed)
CSW contacted patient to follow up on weekly food delivery package. Patient informed of delivery time and no face to face contact during delivery. Patient verbalizes understanding and grateful for the assistance.  CSW continues to follow for supportive needs. Jackie Erlinda Solinger, LCSW, CCSW-MCS [phone]  

## 2018-09-29 NOTE — Progress Notes (Signed)
Patient received Feraheme via a PIV. Observed for at least 30 minutes post infusion.Tolerated well, vitals stable, discharge instructions given, verbalized understanding. Patient alert, oriented and ambulatory at the time of discharge.  

## 2018-09-29 NOTE — Discharge Instructions (Signed)

## 2018-10-04 LAB — NICOTINE/COTININE METABOLITES
Cotinine: 49.2 ng/mL
Nicotine: 6.4 ng/mL

## 2018-10-05 ENCOUNTER — Telehealth (HOSPITAL_COMMUNITY): Payer: Self-pay | Admitting: Licensed Clinical Social Worker

## 2018-10-05 ENCOUNTER — Encounter: Payer: Self-pay | Admitting: General Surgery

## 2018-10-05 ENCOUNTER — Telehealth: Payer: Self-pay | Admitting: General Surgery

## 2018-10-05 NOTE — Telephone Encounter (Signed)
CSW contacted patient to follow up on weekly food delivery package. Patient informed of delivery time and no face to face contact during delivery. Message left as no answer.  CSW continues to follow for supportive needs. Jackie Jashon Ishida, LCSW, CCSW-MCS [phone] 

## 2018-10-05 NOTE — Telephone Encounter (Signed)
Tried to contact the patient for pre-screen to telemed visit. No answer, unable to leave a message, voice mail not set up.

## 2018-10-06 ENCOUNTER — Encounter (HOSPITAL_COMMUNITY): Payer: Medicaid Other

## 2018-10-07 ENCOUNTER — Other Ambulatory Visit: Payer: Self-pay | Admitting: Internal Medicine

## 2018-10-07 ENCOUNTER — Other Ambulatory Visit (HOSPITAL_COMMUNITY): Payer: Self-pay | Admitting: Adult Health

## 2018-10-07 DIAGNOSIS — G629 Polyneuropathy, unspecified: Secondary | ICD-10-CM

## 2018-10-07 DIAGNOSIS — R52 Pain, unspecified: Secondary | ICD-10-CM

## 2018-10-10 ENCOUNTER — Ambulatory Visit (INDEPENDENT_AMBULATORY_CARE_PROVIDER_SITE_OTHER): Payer: Medicaid Other | Admitting: Gastroenterology

## 2018-10-10 ENCOUNTER — Other Ambulatory Visit: Payer: Self-pay

## 2018-10-10 DIAGNOSIS — K649 Unspecified hemorrhoids: Secondary | ICD-10-CM

## 2018-10-10 DIAGNOSIS — D509 Iron deficiency anemia, unspecified: Secondary | ICD-10-CM | POA: Diagnosis not present

## 2018-10-10 DIAGNOSIS — R198 Other specified symptoms and signs involving the digestive system and abdomen: Secondary | ICD-10-CM | POA: Diagnosis not present

## 2018-10-10 DIAGNOSIS — K5909 Other constipation: Secondary | ICD-10-CM

## 2018-10-10 DIAGNOSIS — K625 Hemorrhage of anus and rectum: Secondary | ICD-10-CM

## 2018-10-10 MED ORDER — LINACLOTIDE 145 MCG PO CAPS: 145.0000 ug | ORAL_CAPSULE | Freq: Every day | ORAL | 6 refills | Status: DC

## 2018-10-10 NOTE — Patient Instructions (Signed)
If you are age 46 or older, your body mass index should be between 23-30. Your There is no height or weight on file to calculate BMI. If this is out of the aforementioned range listed, please consider follow up with your Primary Care Provider.  If you are age 4 or younger, your body mass index should be between 19-25. Your There is no height or weight on file to calculate BMI. If this is out of the aformentioned range listed, please consider follow up with your Primary Care Provider.    Decrease Miralax 17grams once daily.   Continue Colace 100mg  twice daily.   We have sent the following medications to your pharmacy for you to pick up at your convenience: Linzess  You have been scheduled for an abdominal ultrasound at [facility] on 10/12/2018  at 9:30am Please arrive 15 minutes prior to your appointment for registration. Make certain not to have anything to eat or drink after midnight the night before procedure. Should you need to reschedule your appointment, please contact radiology at [phone]. This test typically takes about 30 minutes to perform.  Dr. Meridee Score has recommended that you have 1 more IV Iron Infusion. A nurse will contact you regarding getting iron infusion scheduled.    Thank you for choosing me and [facility].  Dr. Meridee Score

## 2018-10-10 NOTE — Telephone Encounter (Signed)
Ok to refill please advise 

## 2018-10-10 NOTE — Progress Notes (Signed)
GASTROENTEROLOGY OUTPATIENT CLINIC VISIT   Primary Care Provider Patient, No Pcp Per No address on file None  Patient Profile: Jonathan Wood is a 48 y.o. male with a pmh significant for CHF status post LVAD, GERD, OSA, constipation, hemorrhoids, colon polyps (tubular adenomas), PUD, anemia (multifactorial).  The patient presents to the [facility] for an evaluation and management of problem(s) noted below:  Problem List 1. Iron deficiency anemia, unspecified iron deficiency anemia type   2. Increased abdominal girth   3. Chronic constipation   4. Hemorrhoids, unspecified hemorrhoid type   5. Rectal bleeding     I connected with  [name] on 10/11/18. I verified that I was speaking with the correct person using two identifiers. Due to the COVID-19 Pandemic, this service was provided via telemedicine using audiovisual media.   The patient was located at home. The provider was located in the office. The patient did consent to this visit and is aware of charges through their insurance as well as the limitations of evaluation and management by telemedicine. Other persons participating in this telemedicine service were none. Time spent on visit was greater than 25 minutes on discussion with the patient and coordination of care.   History of Present Illness Please see initial consultation note for full details of HPI.  Interval History The patient has received his first dose of IV iron as an outpatient and seems to be doing well with it.  Patient is happy that he is using the restroom on a daily basis with Linzess 145 mcg daily.  We will send a prescription for the patient at this point in time.  He is still taking MiraLAX though he is using it 2 times daily every other day because does not want to have significant bowel movements.  He still taking his stool softener.  He is no longer taking Dulcolax.  He does feel like he is having some increasing  abdominal girth and wonders if that is fluid that is accumulating or if there is some mild distention as a result of his bowel movements moving.  His weight has been stable overall on his current diuretic regimen.  No significant abdominal pain.  He did have a couple episodes of small bright red blood on the toilet paper but no overt hematochezia.  He wonders if there is going to need to be indication for his hemorrhoids to be banded as we have previously discussed.  He has not been scheduled for a second dose of IV iron as of yet.  GI Review of Systems Positive as above Negative for odynophagia, dysphagia, nausea, vomiting, abdominal pain  Review of Systems General: Denies fevers/chills Cardiovascular: Denies chest pain Pulmonary: Shortness of breath at baseline Gastroenterological: See HPI Genitourinary: Denies darkened urine or hematuria Hematological: Positive for easy bruising/bleeding due to anticoagulation Dermatological: Denies jaundice Psychological: Mood is stable   Medications Current Outpatient Medications  Medication Sig Dispense Refill   amiodarone (PACERONE) 200 MG tablet Take 1 tablet (200 mg total) by mouth daily. 30 tablet 6   citalopram (CELEXA) 10 MG tablet Take 1 tablet (10 mg total) by mouth daily. 90 tablet 5   docusate sodium (COLACE) 100 MG capsule Take 1 capsule (100 mg total) by mouth 3 (three) times daily. 90 capsule 2   ferrous sulfate 325 (65 FE) MG tablet Take 1 tablet (325 mg total) by mouth daily with breakfast. 30 tablet 3   gabapentin (NEURONTIN) 300 MG capsule Take 1 capsule (300  mg total) by mouth 3 (three) times daily. 90 capsule 6   hydrALAZINE (APRESOLINE) 25 MG tablet Take 1.5 tablets (37.5 mg total) by mouth every 8 (eight) hours. 135 tablet 6   hydrOXYzine (ATARAX/VISTARIL) 25 MG tablet Take 25 mg by mouth every 8 (eight) hours as needed for anxiety.     linaclotide (LINZESS) 145 MCG CAPS capsule Take 1 capsule (145 mcg total) by mouth  daily before breakfast. 30 capsule 6   pantoprazole (PROTONIX) 40 MG tablet Take 1 tablet (40 mg total) by mouth 2 (two) times daily. (Patient taking differently: Take 40 mg by mouth daily. ) 60 tablet 6   polyethylene glycol powder (GLYCOLAX/MIRALAX) powder Take 17 g by mouth daily as needed.      potassium chloride SA (KLOR-CON M20) 20 MEQ tablet Take 1 tablet (20 mEq total) by mouth daily. (Patient not taking: Reported on 09/20/2018) 90 tablet 3   sildenafil (REVATIO) 20 MG tablet Take 1 tablet (20 mg total) by mouth 3 (three) times daily. 90 tablet 5   sorbitol 70 % solution Take 15 mLs by mouth daily as needed. 473 mL 0   torsemide (DEMADEX) 20 MG tablet Take 1 tablet (20 mg total) by mouth daily for 30 days. Reports that he is taking every other day (Patient taking differently: Take 20 mg by mouth every other day. Reports that he is taking every other day as needed) 30 tablet 3   warfarin (COUMADIN) 5 MG tablet Take 5-7.5 mg by mouth See admin instructions. Take 7.5 mg on MON WED FRI, take 5mg  on all other days.     No current facility-administered medications for this visit.     Allergies No Known Allergies  Histories Past Medical History:  Diagnosis Date   Asthma    CHF (congestive heart failure) (HCC)    a. 09/2016: EF 20-25% with cath showing normal cors   GERD (gastroesophageal reflux disease)    History of hiatal hernia    OSA on CPAP 09/06/2018   Severe OSA with AHI 68/hr on CPAP at 12cm H2O   Past Surgical History:  Procedure Laterality Date   BIOPSY  08/23/2018   Procedure: BIOPSY;  Surgeon: Lemar Lofty., MD;  Location: [facility] ENDOSCOPY;  Service: Gastroenterology;;   COLONOSCOPY N/A 08/23/2018   Procedure: COLONOSCOPY;  Surgeon: Lemar Lofty., MD;  Location: [facility] ENDOSCOPY;  Service: Gastroenterology;  Laterality: N/A;   ENTEROSCOPY N/A 08/23/2018   Procedure: ENTEROSCOPY;  Surgeon: Meridee Score Netty Starring., MD;  Location: [facility] ENDOSCOPY;  Service:  Gastroenterology;  Laterality: N/A;   HEMOSTASIS CLIP PLACEMENT  08/23/2018   Procedure: HEMOSTASIS CLIP PLACEMENT;  Surgeon: Lemar Lofty., MD;  Location: [facility] ENDOSCOPY;  Service: Gastroenterology;;   IABP INSERTION N/A 09/05/2017   Procedure: IABP INSERTION;  Surgeon: Dolores Patty, MD;  Location: [facility] INVASIVE CV LAB;  Service: Cardiovascular;  Laterality: N/A;   INSERTION OF IMPLANTABLE LEFT VENTRICULAR ASSIST DEVICE N/A 09/06/2017   Procedure: INSERTION OF IMPLANTABLE LEFT VENTRICULAR ASSIST DEVICE - HM3;  Surgeon: Kerin Perna, MD;  Location: [facility] OR;  Service: Open Heart Surgery;  Laterality: N/A;  HM3   NASAL FRACTURE SURGERY  1987   POLYPECTOMY  08/23/2018   Procedure: POLYPECTOMY;  Surgeon: Mansouraty, Netty Starring., MD;  Location: [facility] ENDOSCOPY;  Service: Gastroenterology;;   RIGHT HEART CATH N/A 08/30/2017   Procedure: RIGHT HEART CATH;  Surgeon: Dolores Patty, MD;  Location: [facility] INVASIVE CV LAB;  Service: Cardiovascular;  Laterality: N/A;   RIGHT/LEFT HEART  CATH AND CORONARY ANGIOGRAPHY N/A 10/08/2016   Procedure: Right/Left Heart Cath and Coronary Angiography;  Surgeon: Orpah Cobb, MD;  Location: [facility] INVASIVE CV LAB;  Service: Cardiovascular;  Laterality: N/A;   SUBMUCOSAL TATTOO INJECTION  08/23/2018   Procedure: SUBMUCOSAL TATTOO INJECTION;  Surgeon: Lemar Lofty., MD;  Location: [facility] ENDOSCOPY;  Service: Gastroenterology;;   TEE WITHOUT CARDIOVERSION N/A 09/06/2017   Procedure: TRANSESOPHAGEAL ECHOCARDIOGRAM (TEE);  Surgeon: Donata Clay, Theron Arista, MD;  Location: [facility] OR;  Service: Open Heart Surgery;  Laterality: N/A;   Social History   Socioeconomic History   Marital status: Legally Separated    Spouse name: Not on file   Number of children: Not on file   Years of education: Not on file   Highest education level: Not on file  Occupational History   Not on file  Social Needs   Financial resource strain: Not on file   Food insecurity:    Worry:  Not on file    Inability: Not on file   Transportation needs:    Medical: Not on file    Non-medical: Not on file  Tobacco Use   Smoking status: Current Every Day Smoker    Packs/day: 0.50    Years: 25.00    Pack years: 12.50    Types: Cigarettes   Smokeless tobacco: Never Used   Tobacco comment: quit 1 week  Substance and Sexual Activity   Alcohol use: Yes    Comment: occas   Drug use: No   Sexual activity: Yes  Lifestyle   Physical activity:    Days per week: Not on file    Minutes per session: Not on file   Stress: Not on file  Relationships   Social connections:    Talks on phone: Not on file    Gets together: Not on file    Attends religious service: Not on file    Active member of club or organization: Not on file    Attends meetings of clubs or organizations: Not on file    Relationship status: Not on file   Intimate partner violence:    Fear of current or ex partner: Not on file    Emotionally abused: Not on file    Physically abused: Not on file    Forced sexual activity: Not on file  Other Topics Concern   Not on file  Social History Narrative   Not on file   Family History  Problem Relation Age of Onset   Hypertension Father    Colon cancer Neg Hx    Esophageal cancer Neg Hx    Inflammatory bowel disease Neg Hx    Liver disease Neg Hx    Pancreatic cancer Neg Hx    Rectal cancer Neg Hx    Stomach cancer Neg Hx    I have reviewed his medical, social, and family history in detail and updated the electronic medical record as necessary.    PHYSICAL EXAMINATION  Telemedicine visit   REVIEW OF DATA  I reviewed the following data at the time of this encounter:  GI Procedures and Studies  No new relevant studies to review  Laboratory Studies  Reviewed in epic  Imaging Studies  No relevant studies to review   ASSESSMENT  Mr. Frasca is a 48 y.o. male with a pmh significant for CHF status post LVAD, GERD, OSA,  constipation, hemorrhoids, colon polyps (tubular adenomas), PUD, anemia (multifactorial).  The patient is seen today for evaluation and management of:  1. Iron  deficiency anemia, unspecified iron deficiency anemia type   2. Increased abdominal girth   3. Chronic constipation   4. Hemorrhoids, unspecified hemorrhoid type   5. Rectal bleeding    Patient is doing better on current regimen of Linzess.  Again asked him to transition his MiraLAX to once daily rather than 2 times every other day and he will maintain his stool softeners as well.  We will have to monitor his rectal bleeding and if it becomes more of an issue I will have him evaluated by 1 of my partners to consider the role of hemorrhoidal banding which would be at slightly higher risk due to his anticoagulation needs but can be performed at times on anticoagulation.  The patient has iron deficiency anemia and is received 1 dose of IV iron as an outpatient and will be scheduled in the coming weeks for second dose.  After this is been completed we will recheck his iron indices and evaluate his need for continued IV iron infusion.  There previously was dated in the heart failure research that suggested IV iron can benefit patients who are on chronic heart failure therapies.  With that being said his LVAD increases chance of developing hemolysis related anemia so that will be continued to be monitored by his primary LVAD team.  Obviously if the patient were to have evidence of significant anemia consideration of capsule endoscopy would be kept in mind.  Hopefully we do not need to consider that but will need to think about it in the future.  All patient questions were answered, to the best of my ability, and the patient agrees to the aforementioned plan of action with follow-up as indicated.   PLAN  Continue Colace 100 mg 2-3 times daily Change MiraLAX to once daily Linzess 145 mcg daily (new prescription to be sent) Continue p.o. iron at current  dose IV iron to be rescheduled for next dose as outpatient Laboratories to be drawn after her next IV iron infusion is complete 2 to 3 weeks after the infusion Holding on hemorrhoidal banding for now but will consider down the road Abdominal ultrasound to evaluate for abdominal girth and ensure that he is not developing ascites   Orders Placed This Encounter  Procedures   US Abdomen Complete   CBC    New Prescriptions   LINACLOTIDE (LINZESS) 145 MCG CAPS CAPSULE    Take 1 capsule (145 mcg total) by mouth daily before breakfast.   Modified Medications   No medications on file    Planned Follow Up No follow-ups on file.   Corliss ParishGabriel Mansouraty, MD  Gastroenterology Advanced Endoscopy Office # [phone]

## 2018-10-11 ENCOUNTER — Encounter: Payer: Self-pay | Admitting: Gastroenterology

## 2018-10-11 DIAGNOSIS — K625 Hemorrhage of anus and rectum: Secondary | ICD-10-CM | POA: Insufficient documentation

## 2018-10-11 DIAGNOSIS — R198 Other specified symptoms and signs involving the digestive system and abdomen: Secondary | ICD-10-CM | POA: Insufficient documentation

## 2018-10-11 DIAGNOSIS — K5909 Other constipation: Secondary | ICD-10-CM | POA: Insufficient documentation

## 2018-10-12 ENCOUNTER — Ambulatory Visit (HOSPITAL_COMMUNITY)
Admission: RE | Admit: 2018-10-12 | Discharge: 2018-10-12 | Disposition: A | Discharge status: Home / Self Care | Payer: Medicaid Other | Source: Ambulatory Visit | Attending: Gastroenterology | Admitting: Gastroenterology

## 2018-10-12 ENCOUNTER — Other Ambulatory Visit: Payer: Self-pay

## 2018-10-12 ENCOUNTER — Encounter (HOSPITAL_COMMUNITY)
Admission: RE | Admit: 2018-10-12 | Discharge: 2018-10-12 | Disposition: A | Discharge status: Home / Self Care | Payer: Medicaid Other | Source: Ambulatory Visit | Attending: Internal Medicine | Admitting: Internal Medicine

## 2018-10-12 DIAGNOSIS — K5909 Other constipation: Secondary | ICD-10-CM | POA: Insufficient documentation

## 2018-10-12 DIAGNOSIS — D509 Iron deficiency anemia, unspecified: Secondary | ICD-10-CM | POA: Diagnosis present

## 2018-10-12 DIAGNOSIS — K649 Unspecified hemorrhoids: Secondary | ICD-10-CM | POA: Insufficient documentation

## 2018-10-12 DIAGNOSIS — R198 Other specified symptoms and signs involving the digestive system and abdomen: Secondary | ICD-10-CM

## 2018-10-12 DIAGNOSIS — Z95811 Presence of heart assist device: Secondary | ICD-10-CM | POA: Diagnosis not present

## 2018-10-12 DIAGNOSIS — K625 Hemorrhage of anus and rectum: Secondary | ICD-10-CM | POA: Insufficient documentation

## 2018-10-12 LAB — CBC WITH DIFFERENTIAL/PLATELET
Abs Immature Granulocytes: 0.03 10*3/uL (ref 0.00–0.07)
Basophils Absolute: 0.1 10*3/uL (ref 0.0–0.1)
Basophils Relative: 1 %
Eosinophils Absolute: 0.1 10*3/uL (ref 0.0–0.5)
Eosinophils Relative: 2 %
HCT: 37.9 % — ABNORMAL LOW (ref 39.0–52.0)
Hemoglobin: 11 g/dL — ABNORMAL LOW (ref 13.0–17.0)
Immature Granulocytes: 1 %
Lymphocytes Relative: 29 %
Lymphs Abs: 1.5 10*3/uL (ref 0.7–4.0)
MCH: 23.9 pg — ABNORMAL LOW (ref 26.0–34.0)
MCHC: 29 g/dL — ABNORMAL LOW (ref 30.0–36.0)
MCV: 82.2 fL (ref 80.0–100.0)
Monocytes Absolute: 0.6 10*3/uL (ref 0.1–1.0)
Monocytes Relative: 10 %
Neutro Abs: 3.1 10*3/uL (ref 1.7–7.7)
Neutrophils Relative %: 57 %
Platelets: 183 10*3/uL (ref 150–400)
RBC: 4.61 MIL/uL (ref 4.22–5.81)
RDW: 24.2 % — ABNORMAL HIGH (ref 11.5–15.5)
WBC: 5.3 10*3/uL (ref 4.0–10.5)
nRBC: 0 % (ref 0.0–0.2)

## 2018-10-12 LAB — BASIC METABOLIC PANEL
Anion gap: 10 (ref 5–15)
BUN: 10 mg/dL (ref 6–20)
CO2: 23 mmol/L (ref 22–32)
Calcium: 9 mg/dL (ref 8.9–10.3)
Chloride: 106 mmol/L (ref 98–111)
Creatinine, Ser: 1.2 mg/dL (ref 0.61–1.24)
GFR calc Af Amer: >60 mL/min (ref >60)
GFR calc non Af Amer: >60 mL/min (ref >60)
Glucose, Bld: 132 mg/dL — ABNORMAL HIGH (ref 70–99)
Potassium: 3.7 mmol/L (ref 3.5–5.1)
Sodium: 139 mmol/L (ref 135–145)

## 2018-10-12 LAB — PROTIME-INR
INR: 2.6 — ABNORMAL HIGH (ref 0.8–1.2)
Prothrombin Time: 27.6 seconds — ABNORMAL HIGH (ref 11.4–15.2)

## 2018-10-12 LAB — LACTATE DEHYDROGENASE: LDH: 261 U/L — ABNORMAL HIGH (ref 98–192)

## 2018-10-13 ENCOUNTER — Ambulatory Visit (HOSPITAL_COMMUNITY): Payer: Self-pay | Admitting: Pharmacist

## 2018-10-13 ENCOUNTER — Telehealth (HOSPITAL_COMMUNITY): Payer: Self-pay | Admitting: Licensed Clinical Social Worker

## 2018-10-13 DIAGNOSIS — Z95811 Presence of heart assist device: Secondary | ICD-10-CM

## 2018-10-13 NOTE — Telephone Encounter (Signed)
CSW contacted patient to follow up on weekly food delivery. Patient states he is in a better place and denies any further food insecurity. Patient will reach out to CSW if needs arise. Lasandra Beech, LCSW, CCSW-MCS [phone]

## 2018-10-27 ENCOUNTER — Encounter (HOSPITAL_COMMUNITY)
Admission: RE | Admit: 2018-10-27 | Discharge: 2018-10-27 | Disposition: A | Discharge status: Home / Self Care | Payer: Medicaid Other | Source: Ambulatory Visit | Attending: Internal Medicine | Admitting: Internal Medicine

## 2018-10-27 ENCOUNTER — Other Ambulatory Visit: Payer: Self-pay

## 2018-10-27 DIAGNOSIS — Z95811 Presence of heart assist device: Secondary | ICD-10-CM | POA: Diagnosis present

## 2018-10-27 LAB — IRON AND TIBC
Iron: 41 ug/dL — ABNORMAL LOW (ref 45–182)
Saturation Ratios: 10 % — ABNORMAL LOW (ref 17.9–39.5)
TIBC: 393 ug/dL (ref 250–450)
UIBC: 352 ug/dL

## 2018-10-27 LAB — PROTIME-INR
INR: 2.9 — ABNORMAL HIGH (ref 0.8–1.2)
Prothrombin Time: 30.2 seconds — ABNORMAL HIGH (ref 11.4–15.2)

## 2018-10-27 LAB — VITAMIN B12: Vitamin B-12: 592 pg/mL (ref 180–914)

## 2018-10-27 LAB — FERRITIN: Ferritin: 45 ng/mL (ref 24–336)

## 2018-10-27 LAB — FOLATE: Folate: 9.8 ng/mL (ref >5.9)

## 2018-10-30 ENCOUNTER — Ambulatory Visit (HOSPITAL_COMMUNITY): Payer: Self-pay | Admitting: Pharmacist

## 2018-10-30 ENCOUNTER — Other Ambulatory Visit (HOSPITAL_COMMUNITY): Payer: Self-pay | Admitting: Unknown Physician Specialty

## 2018-10-30 DIAGNOSIS — Z95811 Presence of heart assist device: Secondary | ICD-10-CM

## 2018-10-30 DIAGNOSIS — D649 Anemia, unspecified: Secondary | ICD-10-CM

## 2018-11-06 ENCOUNTER — Other Ambulatory Visit: Payer: Self-pay | Admitting: Internal Medicine

## 2018-11-08 ENCOUNTER — Telehealth: Payer: Self-pay | Admitting: *Deleted

## 2018-11-08 DIAGNOSIS — G4733 Obstructive sleep apnea (adult) (pediatric): Secondary | ICD-10-CM

## 2018-11-08 DIAGNOSIS — Z9989 Dependence on other enabling machines and devices: Secondary | ICD-10-CM

## 2018-11-08 NOTE — Telephone Encounter (Addendum)
Patient says he does not like the full face mask he is wearing. He is not eligible for another mask until August or he can have a covid test and go into the sleep lab to have a mask refit. He declined the the mask refit and states he will just wear the mask he has but wants to hold off on the auto titration until after his vacation next week. He states will call our office when he returns

## 2018-11-08 NOTE — Telephone Encounter (Signed)
-----   Message from Sueanne Margarita, MD sent at 10/25/2018  7:12 PM EDT ----- Patient is having too many apneas.  Please get a 2 week autotitration from 4 to 20cm H2O and get a download in 2 weeks.  Please find out why he is not using enough

## 2018-11-09 ENCOUNTER — Other Ambulatory Visit (HOSPITAL_COMMUNITY): Payer: Self-pay | Admitting: *Deleted

## 2018-11-09 DIAGNOSIS — R52 Pain, unspecified: Secondary | ICD-10-CM

## 2018-11-09 DIAGNOSIS — G629 Polyneuropathy, unspecified: Secondary | ICD-10-CM

## 2018-11-09 MED ORDER — GABAPENTIN 300 MG PO CAPS: 300.0000 mg | ORAL_CAPSULE | Freq: Three times a day (TID) | ORAL | 6 refills | Status: DC

## 2018-11-10 ENCOUNTER — Other Ambulatory Visit: Payer: Self-pay

## 2018-11-10 ENCOUNTER — Encounter (HOSPITAL_COMMUNITY)
Admission: RE | Admit: 2018-11-10 | Discharge: 2018-11-10 | Disposition: A | Discharge status: Home / Self Care | Payer: Medicaid Other | Source: Ambulatory Visit | Attending: Internal Medicine | Admitting: Internal Medicine

## 2018-11-10 DIAGNOSIS — Z95811 Presence of heart assist device: Secondary | ICD-10-CM | POA: Diagnosis not present

## 2018-11-10 LAB — PROTIME-INR
INR: 2.4 — ABNORMAL HIGH (ref 0.8–1.2)
Prothrombin Time: 26 seconds — ABNORMAL HIGH (ref 11.4–15.2)

## 2018-11-14 ENCOUNTER — Encounter (HOSPITAL_COMMUNITY): Payer: Medicaid Other

## 2018-11-14 ENCOUNTER — Inpatient Hospital Stay (HOSPITAL_COMMUNITY): Admission: RE | Admit: 2018-11-14 | Payer: Medicaid Other | Source: Ambulatory Visit

## 2018-11-15 ENCOUNTER — Ambulatory Visit (HOSPITAL_COMMUNITY): Payer: Self-pay | Admitting: Pharmacist

## 2018-11-15 DIAGNOSIS — Z95811 Presence of heart assist device: Secondary | ICD-10-CM

## 2018-11-21 NOTE — Telephone Encounter (Signed)
Patient called today to say he was back from his vacation and was ready to do his 2 week titration. I have ordered it with [facility] today.

## 2018-11-22 ENCOUNTER — Other Ambulatory Visit (HOSPITAL_COMMUNITY): Payer: Self-pay | Admitting: *Deleted

## 2018-11-22 DIAGNOSIS — Z7901 Long term (current) use of anticoagulants: Secondary | ICD-10-CM

## 2018-11-22 DIAGNOSIS — Z95811 Presence of heart assist device: Secondary | ICD-10-CM

## 2018-11-22 DIAGNOSIS — Z01818 Encounter for other preprocedural examination: Secondary | ICD-10-CM

## 2018-11-22 DIAGNOSIS — I5043 Acute on chronic combined systolic (congestive) and diastolic (congestive) heart failure: Secondary | ICD-10-CM

## 2018-11-23 ENCOUNTER — Encounter (HOSPITAL_COMMUNITY): Payer: Medicaid Other

## 2018-11-23 ENCOUNTER — Encounter (HOSPITAL_COMMUNITY): Payer: Self-pay | Admitting: *Deleted

## 2018-11-27 ENCOUNTER — Other Ambulatory Visit (HOSPITAL_COMMUNITY): Payer: Self-pay | Admitting: *Deleted

## 2018-11-28 ENCOUNTER — Other Ambulatory Visit: Payer: Self-pay

## 2018-11-28 ENCOUNTER — Ambulatory Visit (HOSPITAL_COMMUNITY)
Admission: RE | Admit: 2018-11-28 | Discharge: 2018-11-28 | Disposition: A | Discharge status: Home / Self Care | Payer: Medicaid Other | Source: Ambulatory Visit | Attending: Internal Medicine | Admitting: Internal Medicine

## 2018-11-28 DIAGNOSIS — Z95811 Presence of heart assist device: Secondary | ICD-10-CM

## 2018-11-28 DIAGNOSIS — D649 Anemia, unspecified: Secondary | ICD-10-CM | POA: Insufficient documentation

## 2018-11-28 MED ORDER — SODIUM CHLORIDE 0.9 % IV SOLN
510.0000 mg | INTRAVENOUS | Status: DC
  Administered 2018-11-28: 510 mg via INTRAVENOUS
  Filled 2018-11-28: qty 17

## 2018-12-01 ENCOUNTER — Other Ambulatory Visit: Payer: Self-pay

## 2018-12-01 ENCOUNTER — Encounter (HOSPITAL_COMMUNITY)
Admission: RE | Admit: 2018-12-01 | Discharge: 2018-12-01 | Disposition: A | Discharge status: Home / Self Care | Payer: Medicaid Other | Source: Ambulatory Visit | Attending: Internal Medicine | Admitting: Internal Medicine

## 2018-12-01 ENCOUNTER — Ambulatory Visit (HOSPITAL_COMMUNITY): Payer: Self-pay | Admitting: Pharmacist

## 2018-12-01 DIAGNOSIS — Z95811 Presence of heart assist device: Secondary | ICD-10-CM

## 2018-12-01 LAB — PROTIME-INR
INR: 2.7 — ABNORMAL HIGH (ref 0.8–1.2)
Prothrombin Time: 28.1 seconds — ABNORMAL HIGH (ref 11.4–15.2)

## 2018-12-11 ENCOUNTER — Encounter (HOSPITAL_COMMUNITY): Payer: Medicaid Other

## 2018-12-19 ENCOUNTER — Other Ambulatory Visit (HOSPITAL_COMMUNITY): Payer: Self-pay | Admitting: *Deleted

## 2018-12-19 DIAGNOSIS — Z95811 Presence of heart assist device: Secondary | ICD-10-CM

## 2018-12-19 DIAGNOSIS — I5043 Acute on chronic combined systolic (congestive) and diastolic (congestive) heart failure: Secondary | ICD-10-CM

## 2018-12-19 DIAGNOSIS — Z7901 Long term (current) use of anticoagulants: Secondary | ICD-10-CM

## 2018-12-20 ENCOUNTER — Encounter (HOSPITAL_COMMUNITY): Payer: Self-pay

## 2018-12-20 ENCOUNTER — Other Ambulatory Visit: Payer: Self-pay

## 2018-12-20 ENCOUNTER — Ambulatory Visit (HOSPITAL_COMMUNITY)
Admission: RE | Admit: 2018-12-20 | Discharge: 2018-12-20 | Disposition: A | Discharge status: Home / Self Care | Payer: Medicaid Other | Source: Ambulatory Visit | Attending: Cardiology | Admitting: Cardiology

## 2018-12-20 ENCOUNTER — Ambulatory Visit (HOSPITAL_COMMUNITY): Payer: Self-pay | Admitting: Pharmacist

## 2018-12-20 DIAGNOSIS — Z8249 Family history of ischemic heart disease and other diseases of the circulatory system: Secondary | ICD-10-CM | POA: Insufficient documentation

## 2018-12-20 DIAGNOSIS — G4733 Obstructive sleep apnea (adult) (pediatric): Secondary | ICD-10-CM | POA: Diagnosis not present

## 2018-12-20 DIAGNOSIS — I110 Hypertensive heart disease with heart failure: Secondary | ICD-10-CM | POA: Insufficient documentation

## 2018-12-20 DIAGNOSIS — Z95811 Presence of heart assist device: Secondary | ICD-10-CM

## 2018-12-20 DIAGNOSIS — Z01818 Encounter for other preprocedural examination: Secondary | ICD-10-CM | POA: Diagnosis not present

## 2018-12-20 DIAGNOSIS — F17210 Nicotine dependence, cigarettes, uncomplicated: Secondary | ICD-10-CM | POA: Insufficient documentation

## 2018-12-20 DIAGNOSIS — D649 Anemia, unspecified: Secondary | ICD-10-CM | POA: Diagnosis not present

## 2018-12-20 DIAGNOSIS — E876 Hypokalemia: Secondary | ICD-10-CM | POA: Insufficient documentation

## 2018-12-20 DIAGNOSIS — F419 Anxiety disorder, unspecified: Secondary | ICD-10-CM | POA: Diagnosis not present

## 2018-12-20 DIAGNOSIS — J45909 Unspecified asthma, uncomplicated: Secondary | ICD-10-CM | POA: Insufficient documentation

## 2018-12-20 DIAGNOSIS — Z79899 Other long term (current) drug therapy: Secondary | ICD-10-CM | POA: Diagnosis not present

## 2018-12-20 DIAGNOSIS — Z8719 Personal history of other diseases of the digestive system: Secondary | ICD-10-CM | POA: Insufficient documentation

## 2018-12-20 DIAGNOSIS — Z7901 Long term (current) use of anticoagulants: Secondary | ICD-10-CM | POA: Insufficient documentation

## 2018-12-20 DIAGNOSIS — I5022 Chronic systolic (congestive) heart failure: Secondary | ICD-10-CM | POA: Insufficient documentation

## 2018-12-20 DIAGNOSIS — I5043 Acute on chronic combined systolic (congestive) and diastolic (congestive) heart failure: Secondary | ICD-10-CM

## 2018-12-20 DIAGNOSIS — N179 Acute kidney failure, unspecified: Secondary | ICD-10-CM | POA: Diagnosis not present

## 2018-12-20 DIAGNOSIS — Z8711 Personal history of peptic ulcer disease: Secondary | ICD-10-CM | POA: Insufficient documentation

## 2018-12-20 DIAGNOSIS — K219 Gastro-esophageal reflux disease without esophagitis: Secondary | ICD-10-CM | POA: Insufficient documentation

## 2018-12-20 LAB — CBC
HCT: 40.7 % (ref 39.0–52.0)
Hemoglobin: 12.3 g/dL — ABNORMAL LOW (ref 13.0–17.0)
MCH: 26.9 pg (ref 26.0–34.0)
MCHC: 30.2 g/dL (ref 30.0–36.0)
MCV: 88.9 fL (ref 80.0–100.0)
Platelets: 149 10*3/uL — ABNORMAL LOW (ref 150–400)
RBC: 4.58 MIL/uL (ref 4.22–5.81)
RDW: 23.1 % — ABNORMAL HIGH (ref 11.5–15.5)
WBC: 6.4 10*3/uL (ref 4.0–10.5)
nRBC: 0 % (ref 0.0–0.2)

## 2018-12-20 LAB — COMPREHENSIVE METABOLIC PANEL
ALT: 14 U/L (ref 0–44)
AST: 19 U/L (ref 15–41)
Albumin: 3.7 g/dL (ref 3.5–5.0)
Alkaline Phosphatase: 77 U/L (ref 38–126)
Anion gap: 10 (ref 5–15)
BUN: 12 mg/dL (ref 6–20)
CO2: 21 mmol/L — ABNORMAL LOW (ref 22–32)
Calcium: 9.2 mg/dL (ref 8.9–10.3)
Chloride: 109 mmol/L (ref 98–111)
Creatinine, Ser: 1.14 mg/dL (ref 0.61–1.24)
GFR calc Af Amer: >60 mL/min (ref >60)
GFR calc non Af Amer: >60 mL/min (ref >60)
Glucose, Bld: 119 mg/dL — ABNORMAL HIGH (ref 70–99)
Potassium: 3.7 mmol/L (ref 3.5–5.1)
Sodium: 140 mmol/L (ref 135–145)
Total Bilirubin: 1 mg/dL (ref 0.3–1.2)
Total Protein: 7.4 g/dL (ref 6.5–8.1)

## 2018-12-20 LAB — PROTIME-INR
INR: 1.8 — ABNORMAL HIGH (ref 0.8–1.2)
Prothrombin Time: 20.3 seconds — ABNORMAL HIGH (ref 11.4–15.2)

## 2018-12-20 LAB — LACTATE DEHYDROGENASE: LDH: 227 U/L — ABNORMAL HIGH (ref 98–192)

## 2018-12-20 NOTE — Progress Notes (Signed)
Patient presents for 2 mo f/u in VAD clinic today. Pt denies any VAD alarms or equipment issues.   Pt denies complaints, says he has been doing well since last visit. BP elevated today; pt admits he has not been taking Hydralazine 37.5 mg tid, says "he didn't know he was supposed to".  Pt did not increase Protonix to bid dosing as instructed, says he "didn't know". He has stopped his ASA. AVS provided to patient with above medications highlighted when and how to be taken. Pt verbalized understanding of same.   Pt asked if he could shower, shower kit with instructions given to pt - see below. Pt verbalized understanding of same.   Pt "forgot" his home equipment today for Annual Maintenance. Will bring next visit.    Vital Signs:  Temp: 98.1 Doppler Pressure:  120 Automatc BP: 121/104 (110) HR:  86 SPO2: 97% on RA  Weight: 238.4  lbs w/o eqt Last weight: 225.0 lb  VAD Indication: Destination Therapy   VAD interrogation & Equipment Management: Speed: 5600 Flow: 4.6  Power: 4.3w    PI: 5.7 Hct: 38 Alarms: multiple low voltage advisories  Events: 0 - 10 daily  Fixed speed 5600 Low speed limit: 5300  Primary Controller:  Replace back up battery in 18 months. Back up controller:   Replace back up battery in 19 months.  Annual Equipment Maintenance on UBC/PM was performed on 4/19.   I reviewed the LVAD parameters from today and compared the results to the patient's prior recorded data. LVAD interrogation was NEGATIVE for significant power changes, NEGATIVE for clinical alarms and STABLE for PI events/speed drops. No programming changes were made and pump is functioning within specified parameters. Pt is performing daily controller and system monitor self tests along with completing weekly and monthly maintenance for LVAD equipment.  LVAD equipment check completed and is in good working order. Back-up equipment present.   Exit Site Care: Drive line is being maintained  weekly by patient and [place].  Existing VAD dressing removed and site care performed using sterile technique. Drive line exit site cleaned with Chlora prep applicators x 2, allowed to dry, and gauze dressing with silver strip re-applied. Drive line exit site incorporated, the velour is fully implanted at exit site. No erythema, drainage, tenderness, or foul odor noted. Stabilization device present and accurately applied-replaced. Pt denies fever or chills. Pt given 12 daily dressing kits and 12 anchors.     Significant Events on VAD Support:   Device:none  BP & Labs:  Doppler 120 - reflecting modified systolic  Hgb 88.5 - No S/S of bleeding. Specifically denies melena/BRBPR or nosebleeds.  LDH 283 stable at  with established baseline of 200-300. Denies tea-colored urine. No power elevations noted on interrogation.   Patient Instructions:  1. Start your Hydralazine 37.5 (1.5 tab) three times daily. 2. Increase your Protonix to 40 mg twice daily 3. Shower instructions:  1. Drink liquid before first shower  2. Have someone nearby for your first shower  3. Remember to always change your dressing after each shower to keep dressing dry and intact.  4. Wear shoulder strap over your neck, across the body to prevent slippage and dropping of shower bag. 4. Return to [place] clinic in 2 months.    Zada Girt RN [place] Coordinator  Office: [phone]  24/7 Pager: [phone]

## 2018-12-20 NOTE — Progress Notes (Signed)
[facility] Clinic Note   Date:  12/20/2018   ID:  Jonathan Wood, Jonathan Wood 1971-04-04, MRN [id number]  Location: Home  Provider location: [place] [facility] Type of Visit: Established patient  PCP: [name]  Cardiologist:  Arvilla Meres, MD Primary HF: Dr [name]   Chief Complaint: Heart Failure/LVAD  History of Present Illness: Jonathan Wood is a 48 y.o. 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  Admitted 08/21/2018 with volume overload and anemia. Diuresed with IV lasix and transitioned to torsemide 20 mg daily. GI consulted EGD completed and showed gastritis and nonbleeding duodenal ulcers, colonoscopy with 5 polyps removed. Protonix increased to BID, he was started on PO iron, and hydrocortisone suppositories for hemorrhoid treatment. He received a total of 2 uPRBCs. He will stay off ASA for several weeks but can reconsider later.  Discharge weight was 221 pounds.   He presents today for routine f/u. Feels great. Has seen GI and stated on colace and lizness for IBS but had to gut back linzess to every other day. Remains active. Denies SOB, orthpnea or PND. Has gained about 15 pounds. Still smoking a few cigs per day. No ETOH. Denies orthopnea or PND. No fevers, chills or problems with driveline. No bleeding, melena or neuro symptoms. No VAD alarms. Taking all meds as prescribed.    LVAD Interrogation HM III Speed:  5650 Flow: 4.8 PI: 3.8 Power: 4.2 . No alarms   Past Medical History:  Diagnosis Date  . Asthma   . CHF (congestive heart failure) (HCC)    a. 09/2016: EF 20-25% with cath showing normal cors  . GERD (gastroesophageal reflux disease)   . History of hiatal hernia   . OSA on CPAP 09/06/2018   Severe OSA with AHI 68/hr on CPAP at 12cm H2O   Past Surgical History:  Procedure Laterality Date  . BIOPSY  08/23/2018   Procedure: BIOPSY;  Surgeon:  Meridee Score Netty Starring., MD;  Location: [facility] ENDOSCOPY;  Service: Gastroenterology;;  . COLONOSCOPY N/A 08/23/2018   Procedure: COLONOSCOPY;  Surgeon: Lemar Lofty., MD;  Location: [facility] ENDOSCOPY;  Service: Gastroenterology;  Laterality: N/A;  . ENTEROSCOPY N/A 08/23/2018   Procedure: ENTEROSCOPY;  Surgeon: Meridee Score Netty Starring., MD;  Location: [facility] ENDOSCOPY;  Service: Gastroenterology;  Laterality: N/A;  . HEMOSTASIS CLIP PLACEMENT  08/23/2018   Procedure: HEMOSTASIS CLIP PLACEMENT;  Surgeon: Lemar Lofty., MD;  Location: [facility] ENDOSCOPY;  Service: Gastroenterology;;  . IABP INSERTION N/A 09/05/2017   Procedure: IABP INSERTION;  Surgeon: Dolores Patty, MD;  [facility] INVASIVE CV LAB;  Service: Cardiovascular;  Laterality: N/A;  . INSERTION OF IMPLANTABLE LEFT VENTRICULAR ASSIST DEVICE N/A 09/06/2017   Procedure: INSERTION OF IMPLANTABLE LEFT VENTRICULAR ASSIST DEVICE - HM3;  Surgeon: Kerin Perna, MD;  Location: [facility] OR;  Service: Open Heart Surgery;  Laterality: N/A;  HM3  . NASAL FRACTURE SURGERY  1987  . POLYPECTOMY  08/23/2018   Procedure: POLYPECTOMY;  Surgeon: Mansouraty, Netty Starring., MD;  Location: [facility] ENDOSCOPY;  Service: Gastroenterology;;  . RIGHT HEART CATH N/A 08/30/2017   Procedure: RIGHT HEART CATH;  Surgeon: Dolores Patty, MD;  Location: [facility] INVASIVE CV LAB;  Service: Cardiovascular;  Laterality: N/A;  . RIGHT/LEFT HEART CATH AND CORONARY ANGIOGRAPHY N/A 10/08/2016   Procedure: Right/Left Heart Cath and Coronary Angiography;  Surgeon: Orpah Cobb, MD;  Location: [facility] INVASIVE CV LAB;  Service: Cardiovascular;  Laterality: N/A;  . SUBMUCOSAL TATTOO INJECTION  08/23/2018   Procedure: SUBMUCOSAL TATTOO INJECTION;  Surgeon: Lemar LoftyMansouraty, Gabriel Jr., MD;  [facility];  Service: Gastroenterology;;  . TEE WITHOUT CARDIOVERSION N/A 09/06/2017   Procedure: TRANSESOPHAGEAL ECHOCARDIOGRAM (TEE);  Surgeon: Donata ClayVan Trigt, Theron AristaPeter, MD;  [facility];  Service: Open Heart  Surgery;  Laterality: N/A;     Current Outpatient Medications  Medication Sig Dispense Refill  . amiodarone (PACERONE) 200 MG tablet Take 1 tablet (200 mg total) by mouth daily. 30 tablet 6  . citalopram (CELEXA) 10 MG tablet Take 1 tablet (10 mg total) by mouth daily. 90 tablet 5  . docusate sodium (COLACE) 100 MG capsule Take 1 capsule (100 mg total) by mouth 3 (three) times daily. (Patient taking differently: Take 100 mg by mouth 3 (three) times daily. Pt taking every other day) 90 capsule 2  . ferrous sulfate 325 (65 FE) MG tablet Take 1 tablet (325 mg total) by mouth daily with breakfast. 30 tablet 3  . gabapentin (NEURONTIN) 300 MG capsule Take 1 capsule (300 mg total) by mouth 3 (three) times daily. 90 capsule 6  . hydrOXYzine (ATARAX/VISTARIL) 25 MG tablet Take 25 mg by mouth every 8 (eight) hours as needed for anxiety.    Marland Kitchen. linaclotide (LINZESS) 145 MCG CAPS capsule Take 1 capsule (145 mcg total) by mouth daily before breakfast. (Patient taking differently: Take 145 mcg by mouth every other day. ) 30 capsule 6  . pantoprazole (PROTONIX) 40 MG tablet Take 1 tablet (40 mg total) by mouth 2 (two) times daily. (Patient taking differently: Take 40 mg by mouth daily. ) 60 tablet 6  . sildenafil (REVATIO) 20 MG tablet Take 1 tablet (20 mg total) by mouth 3 (three) times daily. 90 tablet 5  . hydrALAZINE (APRESOLINE) 25 MG tablet Take 1.5 tablets (37.5 mg total) by mouth every 8 (eight) hours. (Patient not taking: Reported on 12/20/2018) 135 tablet 6  . potassium chloride SA (KLOR-CON M20) 20 MEQ tablet Take 1 tablet (20 mEq total) by mouth daily. (Patient not taking: Reported on 09/20/2018) 90 tablet 3  . torsemide (DEMADEX) 20 MG tablet Take 1 tablet (20 mg total) by mouth daily for 30 days. Reports that he is taking every other day (Patient taking differently: Take 20 mg by mouth every other day. Reports that he is taking every other day) 30 tablet 3  . warfarin (COUMADIN) 5 MG tablet Take 5-7.5 mg  by mouth See admin instructions. Take 7.5 mg on MON WED  take 5mg  on all other days.     No current facility-administered medications for this encounter.     Allergies:   Patient has no known allergies.   Social History:  The patient  reports that he has been smoking cigarettes. He has a 12.50 pack-year smoking history. He has never used smokeless tobacco. He reports current alcohol use. He reports that he does not use drugs.   Family History:  The patient's family history includes Hypertension in his father.   ROS:  Please see the history of present illness.   All other systems are personally reviewed and negative.   Exam:   General:  NAD.  HEENT: normal  Neck: supple. JVP not elevated.  Carotids 2+ bilat; no bruits. No lymphadenopathy or thryomegaly appreciated. Cor: LVAD hum.  Lungs: Clear. Abdomen: obese soft, nontender, non-distended. No hepatosplenomegaly. No bruits or masses. Good bowel sounds. Driveline site clean. Anchor in place.  Extremities: no cyanosis, clubbing,  rash. Warm no edema  Neuro: alert & oriented x 3. No focal deficits. Moves all 4 without problem    Recent Labs: 08/24/2018: Magnesium 2.1 12/20/2018: ALT 14; BUN 12; Creatinine, Ser 1.14; Hemoglobin 12.3; Platelets 149; Potassium 3.7; Sodium 140    Wt Readings from Last 3 Encounters:  12/20/18 108.1 kg (238 lb 6.4 oz)  09/20/18 106.1 kg (234 lb)  09/07/18 100.2 kg (221 lb)      ASSESSMENT AND PLAN:  1. Chronic systolic HF - EF 47% s/p HM-3 LVAD on 09/06/17 - Doing well. NYHA I symptoms on VAD support  - Continue torsemide 20 mg daily with extra as needed.  - He has been off Entresto and hydralazine - Had AKI with Entresto. Wil not restart currently. MAPs up so will restart hydralazine - Long talk about transplant situation. He continues to smoke. Pt aware he needs 6 months no smoking for transplant consideration and then will likely have long wait after that. He says he is motivated to do it. Hopefully he  can be successful. Need to watch weight too. Body mass index is 30.61 kg/m.   2. HM3 LVAD 09/06/2017.  - VAD interrogated personally. Parameters stable.. - Driveline site improved with improved anchoring. - LDH 227 - INR 1.8 goal 2.0-2.5.Discussed dosing with PharmD personally. - Off ASA with PUD. Can restart down the road  3. Hypokalemia - K 3.7.   4. Anxiety - Continue Celexa.    - Ok to use trazadone for sleep  5. Essential HTN - MAPs up. Restarting hydralazine  6. OSA - sleep study with very severe OSA (AHI 69/hr) - continues to struggle with mask but will f/u with Dr. Radford Pax - encouraged weight loss  7. H/o GI bleed  - 08/2018 EGD completed and showed gastritis and nonbleeding duodenal ulcers, colonoscopy with 5 polyps removed. Protonix was increased to BID.  - He will stay off aspirin for now.   8. Tobacco Abuse  - Discussed smoking cessation. As above   Total time spent 35 minutes. Over half that time spent discussing above.   Signed, [name], MD  12/20/2018 12:27 PM  [facility] [address] [facility] [place] [address] [phone] (office) [phone] (fax)

## 2018-12-20 NOTE — Progress Notes (Signed)
CSW met with patient in the clinic. Patient stated he is doing well and shared about his mother and her recent battel with breast cancer. Patient also asked for a gas card as he continues to struggle with finances. CSW provided support and will continue to be available as needed. [name], [place], [place]

## 2018-12-20 NOTE — Patient Instructions (Signed)
1. Start your Hydralazine 37.5 (1.5 tab) three times daily. 2. Increase your Protonix to 40 mg twice daily 3. Shower instructions:  1. Drink liquid before first shower  2. Have someone nearby for your first shower  3. Remember to always change your dressing after each shower to keep        dressing dry and intact.  4. Wear shoulder strap over your neck, across the body to prevent slippage and      dropping of shower bag. 4. Return to [place] clinic in 2 months.

## 2018-12-23 LAB — NICOTINE/COTININE METABOLITES
Cotinine: 139.3 ng/mL
Nicotine: 2.4 ng/mL

## 2018-12-25 ENCOUNTER — Other Ambulatory Visit (HOSPITAL_COMMUNITY): Payer: Self-pay | Admitting: *Deleted

## 2018-12-25 DIAGNOSIS — I499 Cardiac arrhythmia, unspecified: Secondary | ICD-10-CM

## 2018-12-25 DIAGNOSIS — Z95811 Presence of heart assist device: Secondary | ICD-10-CM

## 2018-12-25 MED ORDER — AMIODARONE HCL 200 MG PO TABS: 200.0000 mg | ORAL_TABLET | Freq: Every day | ORAL | 3 refills | Status: DC

## 2018-12-29 ENCOUNTER — Other Ambulatory Visit (HOSPITAL_COMMUNITY): Payer: Self-pay | Admitting: *Deleted

## 2018-12-29 DIAGNOSIS — Z7901 Long term (current) use of anticoagulants: Secondary | ICD-10-CM

## 2018-12-29 DIAGNOSIS — Z95811 Presence of heart assist device: Secondary | ICD-10-CM

## 2019-01-03 ENCOUNTER — Other Ambulatory Visit (HOSPITAL_COMMUNITY): Payer: Self-pay | Admitting: *Deleted

## 2019-01-03 DIAGNOSIS — Z7901 Long term (current) use of anticoagulants: Secondary | ICD-10-CM

## 2019-01-03 DIAGNOSIS — Z95811 Presence of heart assist device: Secondary | ICD-10-CM

## 2019-01-05 ENCOUNTER — Telehealth: Payer: Self-pay | Admitting: *Deleted

## 2019-01-05 NOTE — Telephone Encounter (Signed)
Left voicemail message reminding patient to go to [facility] for INR draw.   Emerson Monte RN [place] Coordinator  Office: [phone]  24/7 Pager: [phone]

## 2019-01-08 ENCOUNTER — Other Ambulatory Visit: Payer: Self-pay

## 2019-01-08 ENCOUNTER — Other Ambulatory Visit (HOSPITAL_COMMUNITY): Payer: Self-pay | Admitting: *Deleted

## 2019-01-08 ENCOUNTER — Ambulatory Visit (HOSPITAL_COMMUNITY): Payer: Self-pay | Admitting: Pharmacist

## 2019-01-08 ENCOUNTER — Ambulatory Visit (HOSPITAL_COMMUNITY)
Admission: RE | Admit: 2019-01-08 | Discharge: 2019-01-08 | Disposition: A | Discharge status: Home / Self Care | Payer: Medicaid Other | Source: Ambulatory Visit | Attending: Cardiology | Admitting: Cardiology

## 2019-01-08 VITALS — BP 125/69 | HR 80 | Wt 236.0 lb

## 2019-01-08 DIAGNOSIS — I5022 Chronic systolic (congestive) heart failure: Secondary | ICD-10-CM | POA: Diagnosis not present

## 2019-01-08 DIAGNOSIS — Z7901 Long term (current) use of anticoagulants: Secondary | ICD-10-CM

## 2019-01-08 DIAGNOSIS — Z95811 Presence of heart assist device: Secondary | ICD-10-CM | POA: Diagnosis present

## 2019-01-08 DIAGNOSIS — I10 Essential (primary) hypertension: Secondary | ICD-10-CM | POA: Diagnosis not present

## 2019-01-08 DIAGNOSIS — T829XXA Unspecified complication of cardiac and vascular prosthetic device, implant and graft, initial encounter: Secondary | ICD-10-CM

## 2019-01-08 LAB — BASIC METABOLIC PANEL
Anion gap: 12 (ref 5–15)
BUN: 11 mg/dL (ref 6–20)
CO2: 18 mmol/L — ABNORMAL LOW (ref 22–32)
Calcium: 9 mg/dL (ref 8.9–10.3)
Chloride: 107 mmol/L (ref 98–111)
Creatinine, Ser: 1.11 mg/dL (ref 0.61–1.24)
GFR calc Af Amer: >60 mL/min (ref >60)
GFR calc non Af Amer: >60 mL/min (ref >60)
Glucose, Bld: 121 mg/dL — ABNORMAL HIGH (ref 70–99)
Potassium: 3.7 mmol/L (ref 3.5–5.1)
Sodium: 137 mmol/L (ref 135–145)

## 2019-01-08 LAB — CBC
HCT: 44 % (ref 39.0–52.0)
Hemoglobin: 13.2 g/dL (ref 13.0–17.0)
MCH: 27.5 pg (ref 26.0–34.0)
MCHC: 30 g/dL (ref 30.0–36.0)
MCV: 91.7 fL (ref 80.0–100.0)
Platelets: 184 10*3/uL (ref 150–400)
RBC: 4.8 MIL/uL (ref 4.22–5.81)
RDW: 19.2 % — ABNORMAL HIGH (ref 11.5–15.5)
WBC: 5.5 10*3/uL (ref 4.0–10.5)
nRBC: 0 % (ref 0.0–0.2)

## 2019-01-08 LAB — PROTIME-INR
INR: 2.9 — ABNORMAL HIGH (ref 0.8–1.2)
Prothrombin Time: 30.1 seconds — ABNORMAL HIGH (ref 11.4–15.2)

## 2019-01-08 LAB — LACTATE DEHYDROGENASE: LDH: 238 U/L — ABNORMAL HIGH (ref 98–192)

## 2019-01-08 NOTE — Progress Notes (Signed)
[facility] Clinic Note   Date:  01/08/2019   ID:  Jonathan Wood, Pavek 03-03-1971, MRN [id number]  Location: Home  Provider location: [place] [facility] Type of Visit: Established patient  PCP: [facility]  Cardiologist:  [name], MD Primary HF: Dr [name]   Chief Complaint: Heart Failure/LVAD  History of Present Illness: Jonathan Wood is a 48 y.o. 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  Admitted 08/21/2018 with volume overload and anemia. Diuresed with IV lasix and transitioned to torsemide 20 mg daily. GI consulted EGD completed and showed gastritis and nonbleeding duodenal ulcers, colonoscopy with 5 polyps removed. Protonix increased to BID, he was started on PO iron, and hydrocortisone suppositories for hemorrhoid treatment. He received a total of 2 uPRBCs. He will stay off ASA for several weeks but can reconsider later.  Discharge weight was 221 pounds.   He presents today for unscheduled visit due to multiple alarms on controller - saying "power cable disconnect" Also a few Low Voltage alarms. Denies any pump stops or other problems. Otherwise feels well. Denies orthopnea or PND. No fevers, chills or problems with driveline. No bleeding, melena or neuro symptoms. Taking all meds as prescribed.   VAD Indication: Destination Therapy   VAD interrogation & Equipment Management: Speed: 5600 Flow: 4.8  Power: 4.3w PI: 4.3 Hct: 38 Alarms: multiple low voltage advisories. "power cable disconnect"  Events: 0 - 10 daily  Fixed speed 5600 Low speed limit: 5300  Primary Controller: Replace back up battery in 26 months.  (controller replaced today as above)             Backup battery: SN: [id number]  Exp date: 02/01/2020  Back up controller: Replace back up battery in 19 months.  Annual Equipment Maintenance on UBC/PM was performed on 4/19.    Past Medical History:  Diagnosis Date  . Asthma   . CHF (congestive heart failure) ([place])    a. 09/2016: EF 20-25% with cath showing normal cors  . GERD (gastroesophageal reflux disease)   . History of hiatal hernia   . OSA on CPAP 09/06/2018   Severe OSA with AHI 68/hr on CPAP at 12cm H2O   Past Surgical History:  Procedure Laterality Date  . BIOPSY  08/23/2018   Procedure: BIOPSY;  Surgeon: Rush Landmark Telford Nab., MD;  Location: [place];  Service: Gastroenterology;;  . COLONOSCOPY N/A 08/23/2018   Procedure: COLONOSCOPY;  Surgeon: Irving Copas., MD;  Location: [place];  Service: Gastroenterology;  Laterality: N/A;  . ENTEROSCOPY N/A 08/23/2018   Procedure: ENTEROSCOPY;  Surgeon: Rush Landmark Telford Nab., MD;  Location: [place];  Service: Gastroenterology;  Laterality: N/A;  . HEMOSTASIS CLIP PLACEMENT  08/23/2018   Procedure: HEMOSTASIS CLIP PLACEMENT;  Surgeon: Irving Copas., MD;  Location: [place];  Service: Gastroenterology;;  . IABP INSERTION N/A 09/05/2017   Procedure: IABP INSERTION;  Surgeon: Jolaine Artist, MD;  Location: [place] CV LAB;  Service: Cardiovascular;  Laterality: N/A;  . INSERTION OF IMPLANTABLE LEFT VENTRICULAR ASSIST DEVICE N/A 09/06/2017   Procedure: INSERTION OF IMPLANTABLE LEFT VENTRICULAR ASSIST DEVICE - HM3;  Surgeon: Ivin Poot, MD;  Location: [place];  Service: Open Heart Surgery;  Laterality: N/A;  [place]  . POLYPECTOMY  08/23/2018   Procedure: POLYPECTOMY;  Surgeon: Mansouraty, Telford Nab., MD;  Location: [place];  Service: Gastroenterology;;  . RIGHT HEART CATH N/A 08/30/2017   Procedure: RIGHT HEART CATH;  Surgeon: Dolores PattyBensimhon, Abhinav Mayorquin R, MD;  [facility];  Service: Cardiovascular;  Laterality: N/A;  . RIGHT/LEFT HEART CATH AND CORONARY ANGIOGRAPHY N/A 10/08/2016   Procedure: Right/Left Heart Cath and Coronary Angiography;  Surgeon: Orpah CobbKadakia, Ajay, MD;  [facility];  Service: Cardiovascular;  Laterality: N/A;  . SUBMUCOSAL TATTOO INJECTION  08/23/2018   Procedure: SUBMUCOSAL TATTOO INJECTION;  Surgeon: Lemar LoftyMansouraty, Gabriel Jr., MD;  [facility];  Service: Gastroenterology;;  . TEE WITHOUT CARDIOVERSION N/A 09/06/2017   Procedure: TRANSESOPHAGEAL ECHOCARDIOGRAM (TEE);  Surgeon: Donata ClayVan Trigt, Theron AristaPeter, MD;  [facility];  Service: Open Heart Surgery;  Laterality: N/A;     Current Outpatient Medications  Medication Sig Dispense Refill  . amiodarone (PACERONE) 200 MG tablet Take 1 tablet (200 mg total) by mouth daily. 90 tablet 3  . citalopram (CELEXA) 10 MG tablet Take 1 tablet (10 mg total) by mouth daily. 90 tablet 5  . docusate sodium (COLACE) 100 MG capsule Take 1 capsule (100 mg total) by mouth 3 (three) times daily. (Patient taking differently: Take 100 mg by mouth 3 (three) times daily. Pt taking every other day) 90 capsule 2  . ferrous sulfate 325 (65 FE) MG tablet Take 1 tablet (325 mg total) by mouth daily with breakfast. 30 tablet 3  . gabapentin (NEURONTIN) 300 MG capsule Take 1 capsule (300 mg total) by mouth 3 (three) times daily. 90 capsule 6  . hydrALAZINE (APRESOLINE) 25 MG tablet Take 1.5 tablets (37.5 mg total) by mouth every 8 (eight) hours. (Patient not taking: Reported on 12/20/2018) 135 tablet 6  . hydrOXYzine (ATARAX/VISTARIL) 25 MG tablet Take 25 mg by mouth every 8 (eight) hours as needed for anxiety.    Marland Kitchen. linaclotide (LINZESS) 145 MCG CAPS capsule Take 1 capsule (145 mcg total) by mouth daily before breakfast. (Patient taking differently: Take 145 mcg by mouth every other day. ) 30 capsule 6  . pantoprazole (PROTONIX) 40 MG tablet Take 1 tablet (40 mg total) by mouth 2 (two) times daily. (Patient taking differently: Take 40 mg by mouth daily. ) 60 tablet 6  . potassium chloride SA (KLOR-CON M20) 20 MEQ tablet Take 1 tablet (20 mEq total) by mouth daily. (Patient not taking: Reported on 09/20/2018) 90 tablet 3  .  sildenafil (REVATIO) 20 MG tablet Take 1 tablet (20 mg total) by mouth 3 (three) times daily. 90 tablet 5  . torsemide (DEMADEX) 20 MG tablet Take 1 tablet (20 mg total) by mouth daily for 30 days. Reports that he is taking every other day (Patient taking differently: Take 20 mg by mouth every other day. Reports that he is taking every other day) 30 tablet 3  . warfarin (COUMADIN) 5 MG tablet Take 5-7.5 mg by mouth See admin instructions. Take 7.5 mg on MON WED  take 5mg  on all other days.     No current facility-administered medications for this encounter.     Allergies:   Patient has no known allergies.   Social History:  The patient  reports that he has been smoking cigarettes. He has a 12.50 pack-year smoking history. He has never used smokeless tobacco. He reports current alcohol use. He reports that he does not use drugs.   Family History:  The patient's family history includes Hypertension in his father.   ROS:  Please see the history of present illness.   All other systems  are personally reviewed and negative.   Vital Signs:  Temp:  Doppler Pressure: 114     (reports he has not taken his medications this morning) Automatc BP: 125/69 (104) HR:  80 SPO2: UTO  Weight: 236.0  lbs w/o eqt Last weight: 238.4 lb  Exam:   General:  NAD.  HEENT: normal  Neck: supple. JVP not elevated.  Carotids 2+ bilat; no bruits. No lymphadenopathy or thryomegaly appreciated. Cor: LVAD hum.  Lungs: Clear. Abdomen: obese soft, nontender, non-distended. No hepatosplenomegaly. No bruits or masses. Good bowel sounds. Driveline site clean. Anchor in place. Driveline covering is torn Extremities: no cyanosis, clubbing, rash. Warm no edema  Neuro: alert & oriented x 3. No focal deficits. Moves all 4 without problem    Recent Labs: 08/24/2018: Magnesium 2.1 12/20/2018: ALT 14 01/08/2019: BUN 11; Creatinine, Ser 1.11; Hemoglobin 13.2; Platelets 184; Potassium 3.7; Sodium 137    Wt Readings from Last 3  Encounters:  01/08/19 107 kg (236 lb)  12/20/18 108.1 kg (238 lb 6.4 oz)  09/20/18 106.1 kg (234 lb)      ASSESSMENT AND PLAN:  1. Chronic systolic HF - EF 60% s/p HM-3 LVAD on 09/06/17 - Stable. NYHA I symptoms on VAD support  - Volume status ok. Continue torsemide 20 mg daily with extra as needed.  - He has been off Entresto - MAPs improved with increased hydralazine but now going back up. Will follow. May need to increase hydralazine to 50 tid.  - Had AKI with Entresto. Wil not restart currently.  - Long talk about transplant situation. He continues to smoke. Pt aware he needs 6 months no smoking for transplant consideration and then will likely have long wait after that. He says he is motivated to do it. Hopefully he can be successful. Need to watch weight too. Body mass index is 30.3 kg/m.   2. HM3 LVAD 09/06/2017.  - VAD interrogated personally. Multiple alarms that not connected to power.   - D/w Arboriculturist. Likely due to controller malfunction. Driveline cord covering also torn. We replaced both in clinic today with resolution of issue - Stressed need to be more careful with equipment and avoid running batteries down.  - LDH 238 - INR 2.9 goal 2.0-2.5 Discussed dosing with PharmD personally. - Off ASA with PUD. Can restart down the road  3. Hypokalemia - K 3.7. Will supp   4. Anxiety - Continue Celexa.    - Ok to use trazadone for sleep  5. Essential HTN - MAPs improved with increased hydralazine but now going back up. Will follow. May need to increase hydralazine to 50 tid.  - Had AKI with Entresto. Wil not restart currently.  6. OSA - sleep study with very severe OSA (AHI 69/hr) - continues to struggle with mask but will f/u with Dr. Mayford Knife - encouraged weight loss  7. H/o GI bleed  - 08/2018 EGD completed and showed gastritis and nonbleeding duodenal ulcers, colonoscopy with 5 polyps removed. Protonix was increased to BID.  - He will stay off aspirin  for now.   8. Tobacco Abuse  - Discussed need for compelte cessation    Total time spent 35 minutes. Over half that time spent discussing above.   Signed, Arvilla Meres, MD  01/08/2019 3:42 PM  [facility] [address] [facility] [place] [address] [phone] (office) [phone] (fax)

## 2019-01-08 NOTE — Progress Notes (Signed)
LVAD INR 

## 2019-01-09 NOTE — Patient Instructions (Signed)
1. Please call VAD coordinator if further alarms occur 2. Return to [place] clinic in 2 months for previously scheduled follow up appointment

## 2019-01-09 NOTE — Progress Notes (Addendum)
Patient presents for sick visit in VAD clinic today. Pt states that he has been having frequent "connect power" alarms while he is on batteries. He thinks this started yesterday. Denies any red heart alarms.   Upon interrogation multiple "power cable disconnect" alarms noted. 1 LOW VOLTAGE at 10:02 seen. Interrogation log full with occurrences from this morning. Event log downloaded and sent to [employer]. Per [employer] waveform diagnostic: "The event log for HM3 patient SW contains multiple Power Cable disconnects as well as a few Low Voltage advisories and Low voltage Hazard events. The alarms are being flagged mainly on the [facility] side."   Visual inspection of patient's equipment as follows: batteries and clips intact. No bent pins noted. Batteries fully charged. On controller- no bent pins on either cable. Outer drive line casing is shredded and bent at an odd angle directly above drive line connection site to controller. Yellow fiber exposed.  Sending both controller and modular cable to [employer] further diagnostic testing.    Primary controller replaced: SN#: [id number]  Manufacture date: 06/09/2018  Expiration date: 06/08/2021   VAD modular cable replaced: Lot#: [id number]  Manufacture date: 12/15/2017  Expiration date: 12/15/2020    Vital Signs:  Temp:  Doppler Pressure: 114     (reports he has not taken his medications this morning) Automatc BP: 125/69 (104) HR:  80 SPO2: UTO  Weight: 236.0  lbs w/o eqt Last weight: 238.4 lb  VAD Indication: Destination Therapy   VAD interrogation & Equipment Management: Speed: 5600 Flow: 4.8  Power: 4.3w    PI: 4.3 Hct: 38 Alarms: multiple low voltage advisories  Events: 0 - 10 daily  Fixed speed 5600 Low speed limit: 5300  Primary Controller:  Replace back up battery in 26 months.  (controller replaced today as above)  Backup battery: SN: [id number]  Exp date: 02/01/2020  Back up controller:   Replace back up battery in 19  months.  Annual Equipment Maintenance on UBC/PM was performed on 4/19.   I reviewed the LVAD parameters from today and compared the results to the patient's prior recorded data. LVAD interrogation was NEGATIVE for significant power changes, NEGATIVE for clinical alarms and STABLE for PI events/speed drops. No programming changes were made and pump is functioning within specified parameters. Pt is performing daily controller and system monitor self tests along with completing weekly and monthly maintenance for LVAD equipment.  LVAD equipment check completed and is in good working order. Back-up equipment present.   Exit Site Care: Drive line is being maintained weekly by patient and [place].  Current dressing is falling off and drive line is bent at an extreme angle from exit site. Discussed importance of drive line positioning for tissue ingrowth and to prevent lead fractures. Existing VAD dressing removed and site care performed using sterile technique. Drive line exit site cleaned with Chlora prep applicators x 2, allowed to dry, and gauze dressing with silver strip re-applied. Drive line exit site incorporated, the velour is fully implanted at exit site. No erythema, drainage, tenderness, or foul odor noted. Stabilization device present and accurately applied-replaced. Pt denies fever or chills. Pt given 10 daily dressing kits, 10 anchors, silver strips, and sterile scissors.     Significant Events on VAD Support:   Device:none  BP & Labs:  Doppler 114 - reflecting modified systolic  Hgb 13.2 - No S/S of bleeding. Specifically denies melena/BRBPR or nosebleeds.  LDH 238 stable at  with established baseline of 200-300. Denies tea-colored urine. No  power elevations noted on interrogation.   Patient Instructions:  1. Please call VAD coordinator if further alarms occur 2. Return to [place] clinic in 2 months for previously scheduled follow up appointment  Emerson Monte RN [place]  Coordinator  Office: [phone]  24/7 Pager: [phone]

## 2019-01-10 ENCOUNTER — Telehealth (HOSPITAL_COMMUNITY): Payer: Self-pay

## 2019-01-10 NOTE — Telephone Encounter (Signed)
Faxed Medical Records to [place] on 01/10/2019.

## 2019-01-29 ENCOUNTER — Encounter (HOSPITAL_COMMUNITY)
Admission: RE | Admit: 2019-01-29 | Discharge: 2019-01-29 | Disposition: A | Discharge status: Home / Self Care | Payer: Medicaid Other | Source: Ambulatory Visit | Attending: Internal Medicine | Admitting: Internal Medicine

## 2019-01-29 ENCOUNTER — Ambulatory Visit (HOSPITAL_COMMUNITY): Payer: Self-pay | Admitting: Pharmacist

## 2019-01-29 ENCOUNTER — Other Ambulatory Visit: Payer: Self-pay

## 2019-01-29 DIAGNOSIS — Z95811 Presence of heart assist device: Secondary | ICD-10-CM | POA: Insufficient documentation

## 2019-01-29 LAB — CBC
HCT: 41.7 % (ref 39.0–52.0)
Hemoglobin: 12.5 g/dL — ABNORMAL LOW (ref 13.0–17.0)
MCH: 27.5 pg (ref 26.0–34.0)
MCHC: 30 g/dL (ref 30.0–36.0)
MCV: 91.6 fL (ref 80.0–100.0)
Platelets: 168 10*3/uL (ref 150–400)
RBC: 4.55 MIL/uL (ref 4.22–5.81)
RDW: 16.6 % — ABNORMAL HIGH (ref 11.5–15.5)
WBC: 6.2 10*3/uL (ref 4.0–10.5)
nRBC: 0 % (ref 0.0–0.2)

## 2019-01-29 LAB — PROTIME-INR
INR: 3.4 — ABNORMAL HIGH (ref 0.8–1.2)
Prothrombin Time: 33.8 seconds — ABNORMAL HIGH (ref 11.4–15.2)

## 2019-01-29 LAB — BASIC METABOLIC PANEL
Anion gap: 6 (ref 5–15)
BUN: 15 mg/dL (ref 6–20)
CO2: 20 mmol/L — ABNORMAL LOW (ref 22–32)
Calcium: 8.9 mg/dL (ref 8.9–10.3)
Chloride: 110 mmol/L (ref 98–111)
Creatinine, Ser: 1.06 mg/dL (ref 0.61–1.24)
GFR calc Af Amer: >60 mL/min (ref >60)
GFR calc non Af Amer: >60 mL/min (ref >60)
Glucose, Bld: 118 mg/dL — ABNORMAL HIGH (ref 70–99)
Potassium: 3.7 mmol/L (ref 3.5–5.1)
Sodium: 136 mmol/L (ref 135–145)

## 2019-01-29 LAB — LACTATE DEHYDROGENASE: LDH: 269 U/L — ABNORMAL HIGH (ref 98–192)

## 2019-01-29 NOTE — Progress Notes (Signed)
LVAD INR 

## 2019-02-04 ENCOUNTER — Other Ambulatory Visit: Payer: Self-pay | Admitting: Internal Medicine

## 2019-02-12 ENCOUNTER — Ambulatory Visit (HOSPITAL_COMMUNITY): Payer: Self-pay | Admitting: Pharmacist

## 2019-02-12 ENCOUNTER — Encounter (HOSPITAL_COMMUNITY)
Admission: RE | Admit: 2019-02-12 | Discharge: 2019-02-12 | Disposition: A | Discharge status: Home / Self Care | Payer: Medicaid Other | Source: Ambulatory Visit | Attending: Internal Medicine | Admitting: Internal Medicine

## 2019-02-12 DIAGNOSIS — Z95811 Presence of heart assist device: Secondary | ICD-10-CM

## 2019-02-12 LAB — CBC
HCT: 38 % — ABNORMAL LOW (ref 39.0–52.0)
Hemoglobin: 11.3 g/dL — ABNORMAL LOW (ref 13.0–17.0)
MCH: 27.9 pg (ref 26.0–34.0)
MCHC: 29.7 g/dL — ABNORMAL LOW (ref 30.0–36.0)
MCV: 93.8 fL (ref 80.0–100.0)
Platelets: 167 10*3/uL (ref 150–400)
RBC: 4.05 MIL/uL — ABNORMAL LOW (ref 4.22–5.81)
RDW: 16.3 % — ABNORMAL HIGH (ref 11.5–15.5)
WBC: 8.7 10*3/uL (ref 4.0–10.5)
nRBC: 0 % (ref 0.0–0.2)

## 2019-02-12 LAB — BASIC METABOLIC PANEL
Anion gap: 10 (ref 5–15)
BUN: 15 mg/dL (ref 6–20)
CO2: 24 mmol/L (ref 22–32)
Calcium: 8.3 mg/dL — ABNORMAL LOW (ref 8.9–10.3)
Chloride: 103 mmol/L (ref 98–111)
Creatinine, Ser: 1.22 mg/dL (ref 0.61–1.24)
GFR calc Af Amer: >60 mL/min (ref >60)
GFR calc non Af Amer: >60 mL/min (ref >60)
Glucose, Bld: 122 mg/dL — ABNORMAL HIGH (ref 70–99)
Potassium: 3.3 mmol/L — ABNORMAL LOW (ref 3.5–5.1)
Sodium: 137 mmol/L (ref 135–145)

## 2019-02-12 LAB — LACTATE DEHYDROGENASE: LDH: 270 U/L — ABNORMAL HIGH (ref 98–192)

## 2019-02-12 LAB — PROTIME-INR
INR: 1.7 — ABNORMAL HIGH (ref 0.8–1.2)
Prothrombin Time: 20.1 seconds — ABNORMAL HIGH (ref 11.4–15.2)

## 2019-02-12 NOTE — Progress Notes (Signed)
LVAD INR 

## 2019-02-20 ENCOUNTER — Other Ambulatory Visit (HOSPITAL_COMMUNITY): Payer: Self-pay | Admitting: Unknown Physician Specialty

## 2019-02-20 ENCOUNTER — Other Ambulatory Visit (HOSPITAL_COMMUNITY): Payer: Self-pay | Admitting: *Deleted

## 2019-02-20 DIAGNOSIS — Z7901 Long term (current) use of anticoagulants: Secondary | ICD-10-CM

## 2019-02-20 DIAGNOSIS — Z95811 Presence of heart assist device: Secondary | ICD-10-CM

## 2019-02-20 DIAGNOSIS — I5043 Acute on chronic combined systolic (congestive) and diastolic (congestive) heart failure: Secondary | ICD-10-CM

## 2019-02-20 MED ORDER — DOCUSATE SODIUM 100 MG PO CAPS: 100.0000 mg | ORAL_CAPSULE | Freq: Three times a day (TID) | ORAL | 2 refills | Status: DC

## 2019-02-21 ENCOUNTER — Encounter (HOSPITAL_COMMUNITY): Payer: Medicaid Other

## 2019-02-26 ENCOUNTER — Telehealth (HOSPITAL_COMMUNITY): Payer: Self-pay

## 2019-02-26 NOTE — Telephone Encounter (Signed)
Received 2nd request for records from [place]. Faxed to [phone] on 02/26/2019.

## 2019-03-06 ENCOUNTER — Encounter (HOSPITAL_COMMUNITY): Payer: Medicaid Other

## 2019-03-06 ENCOUNTER — Encounter (HOSPITAL_COMMUNITY): Payer: Self-pay | Admitting: Unknown Physician Specialty

## 2019-03-13 ENCOUNTER — Other Ambulatory Visit (HOSPITAL_COMMUNITY)
Admission: RE | Admit: 2019-03-13 | Discharge: 2019-03-13 | Disposition: A | Discharge status: Home / Self Care | Payer: Medicaid Other | Source: Ambulatory Visit | Attending: Internal Medicine | Admitting: Internal Medicine

## 2019-03-13 ENCOUNTER — Other Ambulatory Visit: Payer: Self-pay

## 2019-03-13 ENCOUNTER — Ambulatory Visit (HOSPITAL_COMMUNITY): Payer: Self-pay | Admitting: Pharmacist

## 2019-03-13 DIAGNOSIS — Z95811 Presence of heart assist device: Secondary | ICD-10-CM

## 2019-03-13 LAB — CBC
HCT: 37.4 % — ABNORMAL LOW (ref 39.0–52.0)
Hemoglobin: 10.9 g/dL — ABNORMAL LOW (ref 13.0–17.0)
MCH: 27.5 pg (ref 26.0–34.0)
MCHC: 29.1 g/dL — ABNORMAL LOW (ref 30.0–36.0)
MCV: 94.2 fL (ref 80.0–100.0)
Platelets: 186 10*3/uL (ref 150–400)
RBC: 3.97 MIL/uL — ABNORMAL LOW (ref 4.22–5.81)
RDW: 15.3 % (ref 11.5–15.5)
WBC: 6.4 10*3/uL (ref 4.0–10.5)
nRBC: 0 % (ref 0.0–0.2)

## 2019-03-13 LAB — BASIC METABOLIC PANEL
Anion gap: 9 (ref 5–15)
BUN: 14 mg/dL (ref 6–20)
CO2: 17 mmol/L — ABNORMAL LOW (ref 22–32)
Calcium: 8.7 mg/dL — ABNORMAL LOW (ref 8.9–10.3)
Chloride: 109 mmol/L (ref 98–111)
Creatinine, Ser: 1.19 mg/dL (ref 0.61–1.24)
GFR calc Af Amer: >60 mL/min (ref >60)
GFR calc non Af Amer: >60 mL/min (ref >60)
Glucose, Bld: 191 mg/dL — ABNORMAL HIGH (ref 70–99)
Potassium: 4 mmol/L (ref 3.5–5.1)
Sodium: 135 mmol/L (ref 135–145)

## 2019-03-13 LAB — PROTIME-INR
INR: 2.1 — ABNORMAL HIGH (ref 0.8–1.2)
Prothrombin Time: 23.3 seconds — ABNORMAL HIGH (ref 11.4–15.2)

## 2019-03-13 LAB — LACTATE DEHYDROGENASE: LDH: 259 U/L — ABNORMAL HIGH (ref 98–192)

## 2019-03-13 NOTE — Progress Notes (Signed)
LVAD INR 

## 2019-03-19 ENCOUNTER — Encounter (HOSPITAL_COMMUNITY): Payer: Medicaid Other

## 2019-03-20 ENCOUNTER — Other Ambulatory Visit (HOSPITAL_COMMUNITY): Payer: Self-pay | Admitting: *Deleted

## 2019-03-20 DIAGNOSIS — Z7901 Long term (current) use of anticoagulants: Secondary | ICD-10-CM

## 2019-03-20 DIAGNOSIS — Z95811 Presence of heart assist device: Secondary | ICD-10-CM

## 2019-03-21 ENCOUNTER — Ambulatory Visit (HOSPITAL_COMMUNITY)
Admission: RE | Admit: 2019-03-21 | Discharge: 2019-03-21 | Disposition: A | Discharge status: Home / Self Care | Payer: Medicaid Other | Source: Ambulatory Visit | Attending: Internal Medicine | Admitting: Internal Medicine

## 2019-03-21 ENCOUNTER — Ambulatory Visit (HOSPITAL_COMMUNITY)
Admission: RE | Admit: 2019-03-21 | Discharge: 2019-03-21 | Disposition: A | Discharge status: Home / Self Care | Payer: Medicaid Other | Source: Ambulatory Visit | Attending: Cardiology | Admitting: Cardiology

## 2019-03-21 ENCOUNTER — Encounter (HOSPITAL_COMMUNITY): Payer: Self-pay | Admitting: Unknown Physician Specialty

## 2019-03-21 ENCOUNTER — Other Ambulatory Visit (HOSPITAL_COMMUNITY): Payer: Self-pay | Admitting: Internal Medicine

## 2019-03-21 ENCOUNTER — Other Ambulatory Visit: Payer: Self-pay

## 2019-03-21 ENCOUNTER — Ambulatory Visit (HOSPITAL_COMMUNITY): Payer: Self-pay | Admitting: Pharmacist

## 2019-03-21 VITALS — BP 100/0 | HR 105 | Ht 74.0 in | Wt 244.0 lb

## 2019-03-21 DIAGNOSIS — F17210 Nicotine dependence, cigarettes, uncomplicated: Secondary | ICD-10-CM | POA: Diagnosis not present

## 2019-03-21 DIAGNOSIS — D649 Anemia, unspecified: Secondary | ICD-10-CM | POA: Diagnosis not present

## 2019-03-21 DIAGNOSIS — I428 Other cardiomyopathies: Secondary | ICD-10-CM | POA: Diagnosis not present

## 2019-03-21 DIAGNOSIS — G4733 Obstructive sleep apnea (adult) (pediatric): Secondary | ICD-10-CM | POA: Diagnosis not present

## 2019-03-21 DIAGNOSIS — F1011 Alcohol abuse, in remission: Secondary | ICD-10-CM | POA: Insufficient documentation

## 2019-03-21 DIAGNOSIS — I5022 Chronic systolic (congestive) heart failure: Secondary | ICD-10-CM | POA: Diagnosis not present

## 2019-03-21 DIAGNOSIS — I110 Hypertensive heart disease with heart failure: Secondary | ICD-10-CM | POA: Diagnosis present

## 2019-03-21 DIAGNOSIS — Z95811 Presence of heart assist device: Secondary | ICD-10-CM | POA: Insufficient documentation

## 2019-03-21 DIAGNOSIS — Z8719 Personal history of other diseases of the digestive system: Secondary | ICD-10-CM | POA: Diagnosis not present

## 2019-03-21 DIAGNOSIS — F419 Anxiety disorder, unspecified: Secondary | ICD-10-CM | POA: Diagnosis not present

## 2019-03-21 DIAGNOSIS — Z7901 Long term (current) use of anticoagulants: Secondary | ICD-10-CM | POA: Diagnosis not present

## 2019-03-21 DIAGNOSIS — Z79899 Other long term (current) drug therapy: Secondary | ICD-10-CM | POA: Insufficient documentation

## 2019-03-21 DIAGNOSIS — K219 Gastro-esophageal reflux disease without esophagitis: Secondary | ICD-10-CM | POA: Diagnosis not present

## 2019-03-21 DIAGNOSIS — J45909 Unspecified asthma, uncomplicated: Secondary | ICD-10-CM | POA: Insufficient documentation

## 2019-03-21 DIAGNOSIS — R635 Abnormal weight gain: Secondary | ICD-10-CM

## 2019-03-21 DIAGNOSIS — D500 Iron deficiency anemia secondary to blood loss (chronic): Secondary | ICD-10-CM

## 2019-03-21 DIAGNOSIS — Z8711 Personal history of peptic ulcer disease: Secondary | ICD-10-CM | POA: Diagnosis not present

## 2019-03-21 DIAGNOSIS — E876 Hypokalemia: Secondary | ICD-10-CM | POA: Diagnosis not present

## 2019-03-21 DIAGNOSIS — R140 Abdominal distension (gaseous): Secondary | ICD-10-CM | POA: Insufficient documentation

## 2019-03-21 DIAGNOSIS — N179 Acute kidney failure, unspecified: Secondary | ICD-10-CM | POA: Diagnosis not present

## 2019-03-21 DIAGNOSIS — R0602 Shortness of breath: Secondary | ICD-10-CM | POA: Insufficient documentation

## 2019-03-21 DIAGNOSIS — Z8249 Family history of ischemic heart disease and other diseases of the circulatory system: Secondary | ICD-10-CM | POA: Diagnosis not present

## 2019-03-21 LAB — CBC
HCT: 33.3 % — ABNORMAL LOW (ref 39.0–52.0)
Hemoglobin: 10.1 g/dL — ABNORMAL LOW (ref 13.0–17.0)
MCH: 27.6 pg (ref 26.0–34.0)
MCHC: 30.3 g/dL (ref 30.0–36.0)
MCV: 91 fL (ref 80.0–100.0)
Platelets: 182 10*3/uL (ref 150–400)
RBC: 3.66 MIL/uL — ABNORMAL LOW (ref 4.22–5.81)
RDW: 15.3 % (ref 11.5–15.5)
WBC: 6.4 10*3/uL (ref 4.0–10.5)
nRBC: 0.3 % — ABNORMAL HIGH (ref 0.0–0.2)

## 2019-03-21 LAB — BASIC METABOLIC PANEL
Anion gap: 8 (ref 5–15)
BUN: 10 mg/dL (ref 6–20)
CO2: 20 mmol/L — ABNORMAL LOW (ref 22–32)
Calcium: 8.7 mg/dL — ABNORMAL LOW (ref 8.9–10.3)
Chloride: 110 mmol/L (ref 98–111)
Creatinine, Ser: 1.08 mg/dL (ref 0.61–1.24)
GFR calc Af Amer: >60 mL/min (ref >60)
GFR calc non Af Amer: >60 mL/min (ref >60)
Glucose, Bld: 132 mg/dL — ABNORMAL HIGH (ref 70–99)
Potassium: 3.4 mmol/L — ABNORMAL LOW (ref 3.5–5.1)
Sodium: 138 mmol/L (ref 135–145)

## 2019-03-21 LAB — VITAMIN B12: Vitamin B-12: 355 pg/mL (ref 180–914)

## 2019-03-21 LAB — HEPATIC FUNCTION PANEL
ALT: 21 U/L (ref 0–44)
AST: 27 U/L (ref 15–41)
Albumin: 3.6 g/dL (ref 3.5–5.0)
Alkaline Phosphatase: 88 U/L (ref 38–126)
Bilirubin, Direct: 0.2 mg/dL (ref 0.0–0.2)
Indirect Bilirubin: 0.6 mg/dL (ref 0.3–0.9)
Total Bilirubin: 0.8 mg/dL (ref 0.3–1.2)
Total Protein: 7.5 g/dL (ref 6.5–8.1)

## 2019-03-21 LAB — FOLATE: Folate: 7 ng/mL (ref >5.9)

## 2019-03-21 LAB — PREALBUMIN: Prealbumin: 17.7 mg/dL — ABNORMAL LOW (ref 18–38)

## 2019-03-21 LAB — TSH: TSH: 1.111 u[IU]/mL (ref 0.350–4.500)

## 2019-03-21 LAB — IRON AND TIBC
Iron: 32 ug/dL — ABNORMAL LOW (ref 45–182)
Saturation Ratios: 8 % — ABNORMAL LOW (ref 17.9–39.5)
TIBC: 406 ug/dL (ref 250–450)
UIBC: 374 ug/dL

## 2019-03-21 LAB — FERRITIN: Ferritin: 31 ng/mL (ref 24–336)

## 2019-03-21 LAB — LACTATE DEHYDROGENASE: LDH: 275 U/L — ABNORMAL HIGH (ref 98–192)

## 2019-03-21 LAB — BRAIN NATRIURETIC PEPTIDE: B Natriuretic Peptide: 426.9 pg/mL — ABNORMAL HIGH (ref 0.0–100.0)

## 2019-03-21 LAB — PROTIME-INR
INR: 2.5 — ABNORMAL HIGH (ref 0.8–1.2)
Prothrombin Time: 26.6 seconds — ABNORMAL HIGH (ref 11.4–15.2)

## 2019-03-21 MED ORDER — LIDOCAINE HCL 1 % IJ SOLN
INTRAMUSCULAR | Status: AC
  Filled 2019-03-21: qty 20

## 2019-03-21 NOTE — Progress Notes (Signed)
ReDS Vest / Clip - 03/21/19 1200      ReDS Vest / Clip   Station Marker  D    Ruler Value  38    ReDS Value  (!) High volume overload    Anatomical Comments  sitting      Reading was 50%

## 2019-03-21 NOTE — Progress Notes (Signed)
Patient presents for 2 mo f/u in VAD clinic today. Although we haven't seen the pt since August because he did not have a car. Denies any issues with his VAD or alarms.   Pt is SOB and states that his "belly is full of fluid." pt is up 8 lbs since his last visit.  Pt did not bring equipment for annual maintenance today. He was due in April. Pt reminded today that he has to bring his equipment to his next appt.   Vital Signs:   Doppler Pressure: 100 Automatc BP: 122/97 (112) HR:  105 SPO2: 98  Weight: 244  lbs w/o eqt Last weight: 236 lb  VAD Indication: Destination Therapy   VAD interrogation & Equipment Management: Speed: 5600 Flow: 4.4  Power: 4.2w    PI: 3.9 Hct: 38 Alarms: multiple low voltage advisories  Events: rare  Fixed speed 5600 Low speed limit: 5300  Primary Controller:  Replace back up battery in 23 months.  Back up controller:   Replace back up battery in 15 months.  Annual Equipment Maintenance on UBC/PM was performed on 4/19.   I reviewed the LVAD parameters from today and compared the results to the patient's prior recorded data. LVAD interrogation was NEGATIVE for significant power changes, NEGATIVE for clinical alarms and STABLE for PI events/speed drops. No programming changes were made and pump is functioning within specified parameters. Pt is performing daily controller and system monitor self tests along with completing weekly and monthly maintenance for LVAD equipment.  LVAD equipment check completed and is in good working order. Back-up equipment present.   Exit Site Care: Drive line is being maintained weekly by patient.  Current dressing is falling off and drive line is bent at an extreme angle from exit site. Discussed importance of drive line positioning for tissue ingrowth and to prevent lead fractures. Existing VAD dressing removed and site care performed using sterile technique. Drive line exit site cleaned with Chlora prep applicators x  2, allowed to dry, and sorbaview dressing applied with bio patch re-applied. Drive line exit site incorporated, the velour is fully implanted at exit site. No erythema, drainage, tenderness, or foul odor noted. Stabilization device present and accurately applied-replaced. Pt denies fever or chills. Pt wants to do his own dressing changes. Pt was informed that it would be better for [place] to do dressing changes every week opposed to the pt doing his own due to high risk of infection.  Significant Events on VAD Support:   Device:none  BP & Labs:  Doppler 100 - reflecting modified systolic  Hgb 34.7 - No S/S of bleeding. Specifically denies melena/BRBPR or nosebleeds.  LDH 275 stable at  with established baseline of 200-300. Denies tea-colored urine. No power elevations noted on interrogation.  1.5 year Intermacs follow up completed including:  Quality of Life, KCCQ-12, and Neurocognitive trail making.   Pt unable to complete 6 minute walk due to SOB and abdominal fullness.  Back up controller:  11V backup battery charged during this visit.  Patient Instructions:  1. BNP and Hep function added to labs today.  2. Will send to IR today for RUQ u/s with possible paracentesis. 3. Double your Torsemide for 3 days. Call the [place] office if fluid does not improve please call the VAD office so that we can schedule a RHC. 4. Chest xray ordered today. 5. Increase Hydralazine 37.5 mg three times a day. 6. Return to clinic in 1 week for dressing change and 1 month for  full visit with annual maintenance.  Carlton Adam RN VAD Coordinator  Office: [phone]  24/7 Pager: [phone]

## 2019-03-21 NOTE — Progress Notes (Signed)
[facility] Clinic Note   Date:  03/21/2019   ID:  Akeel, Reffner [birth year], MRN [id number]  Location: Home  Provider location: [place] [facility] Type of Visit: Established patient  PCP: [name]  Cardiologist:  Arvilla Meres, MD Primary HF: Dr [name]   Chief Complaint: Heart Failure/LVAD  History of Present Illness: Jonathan Wood is a 48 y.o. 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  Admitted 08/21/2018 with volume overload and anemia. Diuresed with IV lasix and transitioned to torsemide 20 mg daily. GI consulted EGD completed and showed gastritis and nonbleeding duodenal ulcers, colonoscopy with 5 polyps removed. Protonix increased to BID, he was started on PO iron, and hydrocortisone suppositories for hemorrhoid treatment. He received a total of 2 uPRBCs. He will stay off ASA for several weeks but can reconsider later.  Discharge weight was 221 pounds.   He presents today for routine f/u. States that he has been gaining weight (8 pounds since last visit) and his belly is full of fluid. Tells [place] he hasn't been eating much and mostly eating salads but has been posting on FB about how many burgers and other stuff he has been eating. Says he is SOB with minimal activity. Taking torsemide 20mg  daily and says sometimes it works and sometimes it doesn't. Denies any LE edema. Denies orthopnea or PND. No fevers, chills or problems with driveline. No bleeding, melena or neuro symptoms. No VAD alarms.   Pt did not bring equipment for annual maintenance today. He was due in April. Pt reminded today that he has to bring his equipment to his next appt.  VAD Indication: Destination Therapy   VAD interrogation & Equipment Management: Speed: 5600 Flow: 4.4  Power: 4.2w PI: 3.9 Hct: 38 Alarms: multiple low voltage advisories  Events: rare  Fixed speed 5600  Low speed limit: 5300  Primary Controller: Replace back up battery in 23 months.  Back up controller: Replace back up battery in 15 months.  Annual Equipment Maintenance on UBC/PM was performed on 4/19.    Past Medical History:  Diagnosis Date  . Asthma   . CHF (congestive heart failure) (HCC)    a. 09/2016: EF 20-25% with cath showing normal cors  . GERD (gastroesophageal reflux disease)   . History of hiatal hernia   . OSA on CPAP 09/06/2018   Severe OSA with AHI 68/hr on CPAP at 12cm H2O   Past Surgical History:  Procedure Laterality Date  . BIOPSY  08/23/2018   Procedure: BIOPSY;  Surgeon: 10/23/2018 Meridee Score., MD;  Location: [facility] ENDOSCOPY;  Service: Gastroenterology;;  . COLONOSCOPY N/A 08/23/2018   Procedure: COLONOSCOPY;  Surgeon: 10/23/2018., MD;  Location: [facility] ENDOSCOPY;  Service: Gastroenterology;  Laterality: N/A;  . ENTEROSCOPY N/A 08/23/2018   Procedure: ENTEROSCOPY;  Surgeon: 10/23/2018 Meridee Score., MD;  Location: [facility] ENDOSCOPY;  Service: Gastroenterology;  Laterality: N/A;  . HEMOSTASIS CLIP PLACEMENT  08/23/2018   Procedure: HEMOSTASIS CLIP PLACEMENT;  Surgeon: 10/23/2018., MD;  Location: [facility] ENDOSCOPY;  Service: Gastroenterology;;  . IABP INSERTION N/A 09/05/2017   Procedure: IABP INSERTION;  Surgeon: 09/07/2017, MD;  Location: [facility] INVASIVE CV LAB;  Service: Cardiovascular;  Laterality: N/A;  . INSERTION OF IMPLANTABLE LEFT VENTRICULAR ASSIST DEVICE N/A 09/06/2017   Procedure: INSERTION OF IMPLANTABLE LEFT VENTRICULAR ASSIST DEVICE - HM3;  Surgeon: 09/08/2017, MD;  Location: [facility] OR;  Service: Open Heart Surgery;  Laterality: N/A;  [place]  . POLYPECTOMY  08/23/2018   Procedure: POLYPECTOMY;  Surgeon: Mansouraty, Telford Nab., MD;  Location: [place];  Service: Gastroenterology;;  . RIGHT HEART CATH N/A 08/30/2017   Procedure: RIGHT HEART CATH;  Surgeon: Jolaine Artist, MD;  Location: [place] CV  LAB;  Service: Cardiovascular;  Laterality: N/A;  . RIGHT/LEFT HEART CATH AND CORONARY ANGIOGRAPHY N/A 10/08/2016   Procedure: Right/Left Heart Cath and Coronary Angiography;  Surgeon: Dixie Dials, MD;  Location: [place] CV LAB;  Service: Cardiovascular;  Laterality: N/A;  . SUBMUCOSAL TATTOO INJECTION  08/23/2018   Procedure: SUBMUCOSAL TATTOO INJECTION;  Surgeon: Irving Copas., MD;  Location: [place];  Service: Gastroenterology;;  . TEE WITHOUT CARDIOVERSION N/A 09/06/2017   Procedure: TRANSESOPHAGEAL ECHOCARDIOGRAM (TEE);  Surgeon: Prescott Gum, Collier Salina, MD;  Location: [name];  Service: Open Heart Surgery;  Laterality: N/A;     Current Outpatient Medications  Medication Sig Dispense Refill  . amiodarone (PACERONE) 200 MG tablet Take 1 tablet (200 mg total) by mouth daily. 90 tablet 3  . docusate sodium (COLACE) 100 MG capsule Take 1 capsule (100 mg total) by mouth 3 (three) times daily. 90 capsule 2  . gabapentin (NEURONTIN) 300 MG capsule Take 1 capsule (300 mg total) by mouth 3 (three) times daily. 90 capsule 6  . hydrALAZINE (APRESOLINE) 25 MG tablet Take 1.5 tablets (37.5 mg total) by mouth every 8 (eight) hours. (Patient taking differently: Take 25 mg by mouth every 8 (eight) hours. ) 135 tablet 6  . hydrOXYzine (ATARAX/VISTARIL) 25 MG tablet Take 25 mg by mouth every 8 (eight) hours as needed for anxiety.    Marland Kitchen linaclotide (LINZESS) 145 MCG CAPS capsule Take 1 capsule (145 mcg total) by mouth daily before breakfast. (Patient taking differently: Take 145 mcg by mouth every other day. ) 30 capsule 6  . pantoprazole (PROTONIX) 40 MG tablet Take 1 tablet (40 mg total) by mouth 2 (two) times daily. (Patient taking differently: Take 40 mg by mouth daily. ) 60 tablet 6  . sildenafil (REVATIO) 20 MG tablet Take 1 tablet (20 mg total) by mouth 3 (three) times daily. 90 tablet 5  . torsemide (DEMADEX) 20 MG tablet Take 1 tablet (20 mg total) by mouth daily for 30 days. Reports that he is  taking every other day 30 tablet 3  . warfarin (COUMADIN) 5 MG tablet Take 5-7.5 mg by mouth See admin instructions. Take 7.5 mg on MON WED  take 5mg  on all other days.    . citalopram (CELEXA) 10 MG tablet Take 1 tablet (10 mg total) by mouth daily. (Patient not taking: Reported on 03/21/2019) 90 tablet 5  . ferrous sulfate 325 (65 FE) MG tablet Take 1 tablet (325 mg total) by mouth daily with breakfast. (Patient not taking: Reported on 03/21/2019) 30 tablet 3  . potassium chloride SA (KLOR-CON M20) 20 MEQ tablet Take 1 tablet (20 mEq total) by mouth daily. (Patient not taking: Reported on 09/20/2018) 90 tablet 3   No current facility-administered medications for this encounter.     Allergies:   Patient has no known allergies.   Social History:  The patient  reports that he has been smoking cigarettes. He has a 12.50 pack-year smoking history. He has never used smokeless tobacco. He reports current alcohol use. He reports that he does not use drugs.   Family History:  The patient's  family history includes Hypertension in his father.   ROS:  Please see the history of present illness.   All other systems are personally reviewed and negative.   Vital Signs:   Doppler Pressure: 100 Automatc BP: 122/97 (112) HR:  105 SPO2: 98  Weight: 244  lbs w/o eqt Last weight: 236 lb  Exam:   General:  NAD.  HEENT: normal  Neck: supple. JVP not obviously elevated.  Carotids 2+ bilat; no bruits. No lymphadenopathy or thryomegaly appreciated. Cor: LVAD hum.  Lungs: Clear. Abdomen: obese soft, nontender, non-distended. No hepatosplenomegaly. No bruits or masses. Good bowel sounds. Driveline site clean. Anchor in place.  Extremities: no cyanosis, clubbing, rash. Warm no edema  Neuro: alert & oriented x 3. No focal deficits. Moves all 4 without problem    Recent Labs: 08/24/2018: Magnesium 2.1 03/21/2019: ALT 21; B Natriuretic Peptide 426.9; BUN 10; Creatinine, Ser 1.08; Hemoglobin 10.1; Platelets 182;  Potassium 3.4; Sodium 138; TSH 1.111    Wt Readings from Last 3 Encounters:  03/21/19 110.7 kg (244 lb)  01/08/19 107 kg (236 lb)  12/20/18 108.1 kg (238 lb 6.4 oz)      ASSESSMENT AND PLAN:  1. Chronic systolic HF - EF 83% s/p HM-3 LVAD on 09/06/17 - Reports 8 pounds weight gain, ab bloating and NYHA III symptoms - I don't see any obvious fluid overload on exam but ReDS 50% - We ordered RUQ u/s looking for ascites today and that was negative (Personally reviewed). CXR ordered but he left before completed - BNP mildly elevated from baseline - My suspicion is that he is just gaining adipose tissue but will double torsemide to 40 daily for 3 days given elevated ReDS reading and BNP and assess response. Can check RHC as needed - MAPs up again. Will increase hydralazine - Had AKI with Entresto. Wil not restart currently.  - Long talk about transplant situation at last visit. He continues to smoke. Pt aware he needs 6 months no smoking for transplant consideration and then will likely have long wait after that. He says he is motivated to do it. Hopefully he can be successful. Need to watch weight too. Body mass index is 31.33 kg/m.   2. HM3 LVAD 09/06/2017.  - VAD interrogated personally. Parameters stable. Multiple low voltage advisories because he is running batteries down  - LDH 275 - INR 2.5 goal 2.0-2.5 Discussed dosing with PharmD personally. - Off ASA with PUD. Can restart down the road  3. Hypokalemia - K 3.4. Will supp   4. Anxiety - Continue Celexa.    - Ok to use trazadone for sleep  5. Essential HTN - MAPs up. Increase hydralazine - Had AKI with Entresto. Wil not restart currently.  6. OSA - sleep study with very severe OSA (AHI 69/hr) - continues to struggle with mask but will f/u with Dr. Mayford Knife - encouraged weight loss  7. H/o GI bleed  - 08/2018 EGD completed and showed gastritis and nonbleeding duodenal ulcers, colonoscopy with 5 polyps removed. Protonix  was increased to BID.  - hgb 10.9 -> 10.1 - Iron low. Will give ferahme - He will stay off aspirin for now.   8. Tobacco Abuse  - Discussed need for complete cessation  Total time spent 45 minutes. Over half that time spent discussing above.  Migdalia Dk, MD  03/21/2019 9:24 PM  [facility] [place] [address] [facility] [place] [address] [phone] (office) (  [phone] (fax)

## 2019-03-21 NOTE — Progress Notes (Signed)
Patient to radiology for possible paracentesis.  Limited US Abdomen shows no fluid.  Pictures saved for review.  No procedure performed.   Brynda Greathouse, MS RD PA-C 12:37 PM

## 2019-03-21 NOTE — Patient Instructions (Signed)
1. Double Torsemide for 3 days, if no improvement CALL VAD office [phone] 2. We will make your follow up appt after your testing is complete today.

## 2019-03-21 NOTE — Progress Notes (Signed)
LVAD INR 

## 2019-03-22 ENCOUNTER — Other Ambulatory Visit (HOSPITAL_COMMUNITY): Payer: Self-pay | Admitting: *Deleted

## 2019-03-22 DIAGNOSIS — Z7901 Long term (current) use of anticoagulants: Secondary | ICD-10-CM

## 2019-03-22 DIAGNOSIS — Z95811 Presence of heart assist device: Secondary | ICD-10-CM

## 2019-03-22 LAB — T4: T4, Total: 7.2 ug/dL (ref 4.5–12.0)

## 2019-03-28 ENCOUNTER — Other Ambulatory Visit (HOSPITAL_COMMUNITY): Payer: Medicaid Other

## 2019-03-29 ENCOUNTER — Other Ambulatory Visit: Payer: Self-pay

## 2019-03-29 ENCOUNTER — Ambulatory Visit (HOSPITAL_COMMUNITY): Payer: Self-pay | Admitting: Pharmacist

## 2019-03-29 ENCOUNTER — Ambulatory Visit (HOSPITAL_COMMUNITY)
Admission: RE | Admit: 2019-03-29 | Discharge: 2019-03-29 | Disposition: A | Discharge status: Home / Self Care | Payer: Medicaid Other | Source: Ambulatory Visit | Attending: Internal Medicine | Admitting: Internal Medicine

## 2019-03-29 DIAGNOSIS — Z95811 Presence of heart assist device: Secondary | ICD-10-CM

## 2019-03-29 DIAGNOSIS — Z7901 Long term (current) use of anticoagulants: Secondary | ICD-10-CM | POA: Diagnosis not present

## 2019-03-29 LAB — PROTIME-INR
INR: 2.3 — ABNORMAL HIGH (ref 0.8–1.2)
Prothrombin Time: 24.8 seconds — ABNORMAL HIGH (ref 11.4–15.2)

## 2019-03-29 IMAGING — DX DG ABD PORTABLE 1V
1 series · 1 of 1 positions shown · non-contrast
Comparison: Abdominal radiograph performed 08/24/2017

CLINICAL DATA: Acute onset of generalized abdominal pain.

EXAM:
PORTABLE ABDOMEN - 1 VIEW

[abdomen kub]
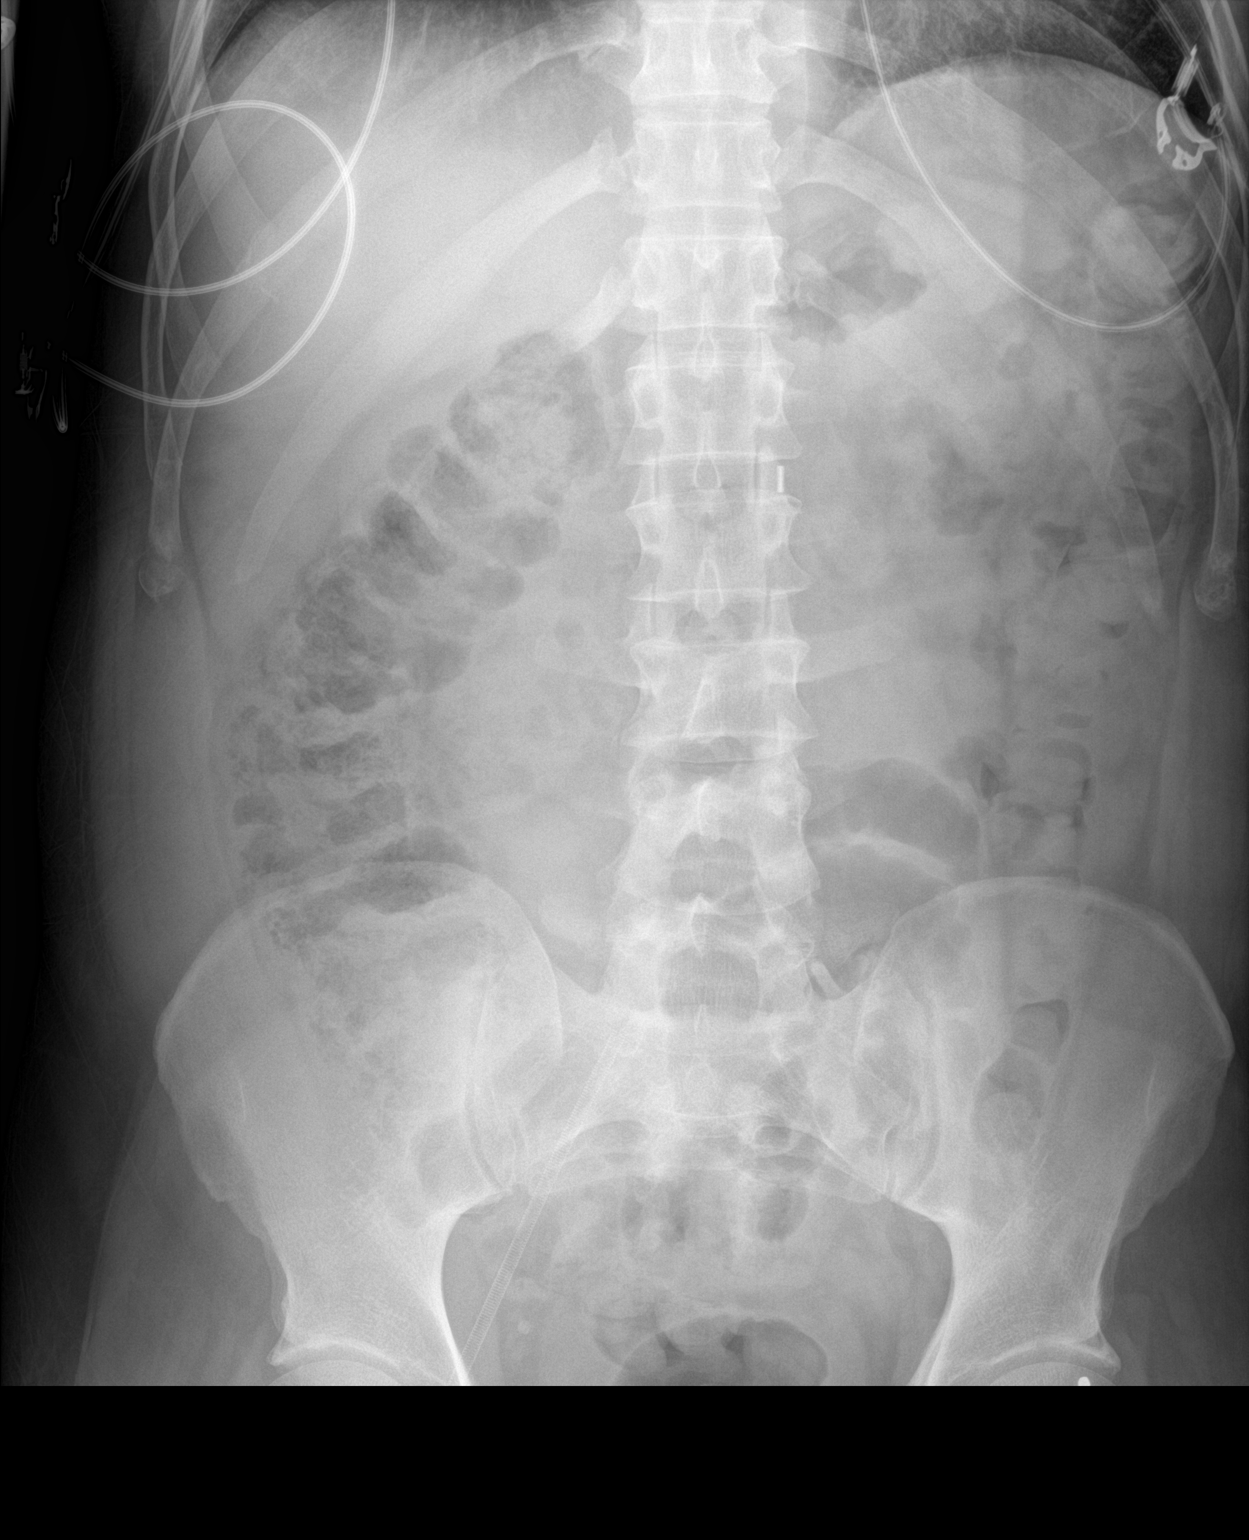

[1 of 1 positions shown; findings below may reference images not displayed]

FINDINGS: The visualized bowel gas pattern is unremarkable. Scattered air and
stool filled loops of colon are seen; no abnormal dilatation of
small bowel loops is seen to suggest small bowel obstruction. No
free intra-abdominal air is identified, though evaluation for free
air is limited on a single supine view.

The visualized osseous structures are within normal limits; the
sacroiliac joints are unremarkable in appearance. The visualized
lung bases are essentially clear.
IMPRESSION: Unremarkable bowel gas pattern; no free intra-abdominal air seen.
Moderate amount of stool noted in the colon.

## 2019-03-29 NOTE — Progress Notes (Signed)
LVAD INR 

## 2019-03-29 NOTE — Progress Notes (Signed)
Pt presents to [facility] for INR and dressing change in wheel chair. Says his uncle parked at [place] parking area and he was unable to walk that distance to VAD clinic.   Pt is requesting to go back to gauze dressings, reports the Sorbaview dressing makes him "itch".    Exit Site Care: Drive line is being maintained weekly by patient. Existing VAD dressing removed and site care performed using sterile technique. Drive line exit site cleaned with Chlora prep applicators x 2, allowed to dry, and gauze dressing applied with silver strip applied. Drive line exit site incorporated, the velour is fully implanted at exit site; small amount yellow/tan drainage noted on exterior of exit site. After cleansing area, unable to express any additional drainage for culture; no tunneling noted. No erythema, tenderness, or foul odor noted. Stabilization device present and accurately applied-replaced. Pt denies fever or chills. Pt wants to do his own dressing changes. Pt was informed that it would be better for [place] to do dressing changes every week opposed to the pt doing his own due to high risk of infection. Pt agrees to come in next week for dressing change.   Return in one week for dressing change.    Zada Girt RN [place] Coordinator  Office: [phone]  24/7 Pager: [phone]

## 2019-03-29 NOTE — Addendum Note (Signed)
Encounter addended by: Lezlie Octave, RN on: 03/29/2019 3:27 PM  Actions taken: Clinical Note Signed

## 2019-03-30 ENCOUNTER — Other Ambulatory Visit (HOSPITAL_COMMUNITY): Payer: Self-pay | Admitting: *Deleted

## 2019-03-30 DIAGNOSIS — D649 Anemia, unspecified: Secondary | ICD-10-CM

## 2019-03-30 DIAGNOSIS — Z7901 Long term (current) use of anticoagulants: Secondary | ICD-10-CM

## 2019-03-30 DIAGNOSIS — Z95811 Presence of heart assist device: Secondary | ICD-10-CM

## 2019-03-30 DIAGNOSIS — I5022 Chronic systolic (congestive) heart failure: Secondary | ICD-10-CM

## 2019-04-03 IMAGING — DX DG CHEST 1V PORT
1 series · 1 of 1 positions shown · non-contrast
Comparison: September 10, 2017

CLINICAL DATA: LVAD.

EXAM:
PORTABLE CHEST 1 VIEW

[chest]
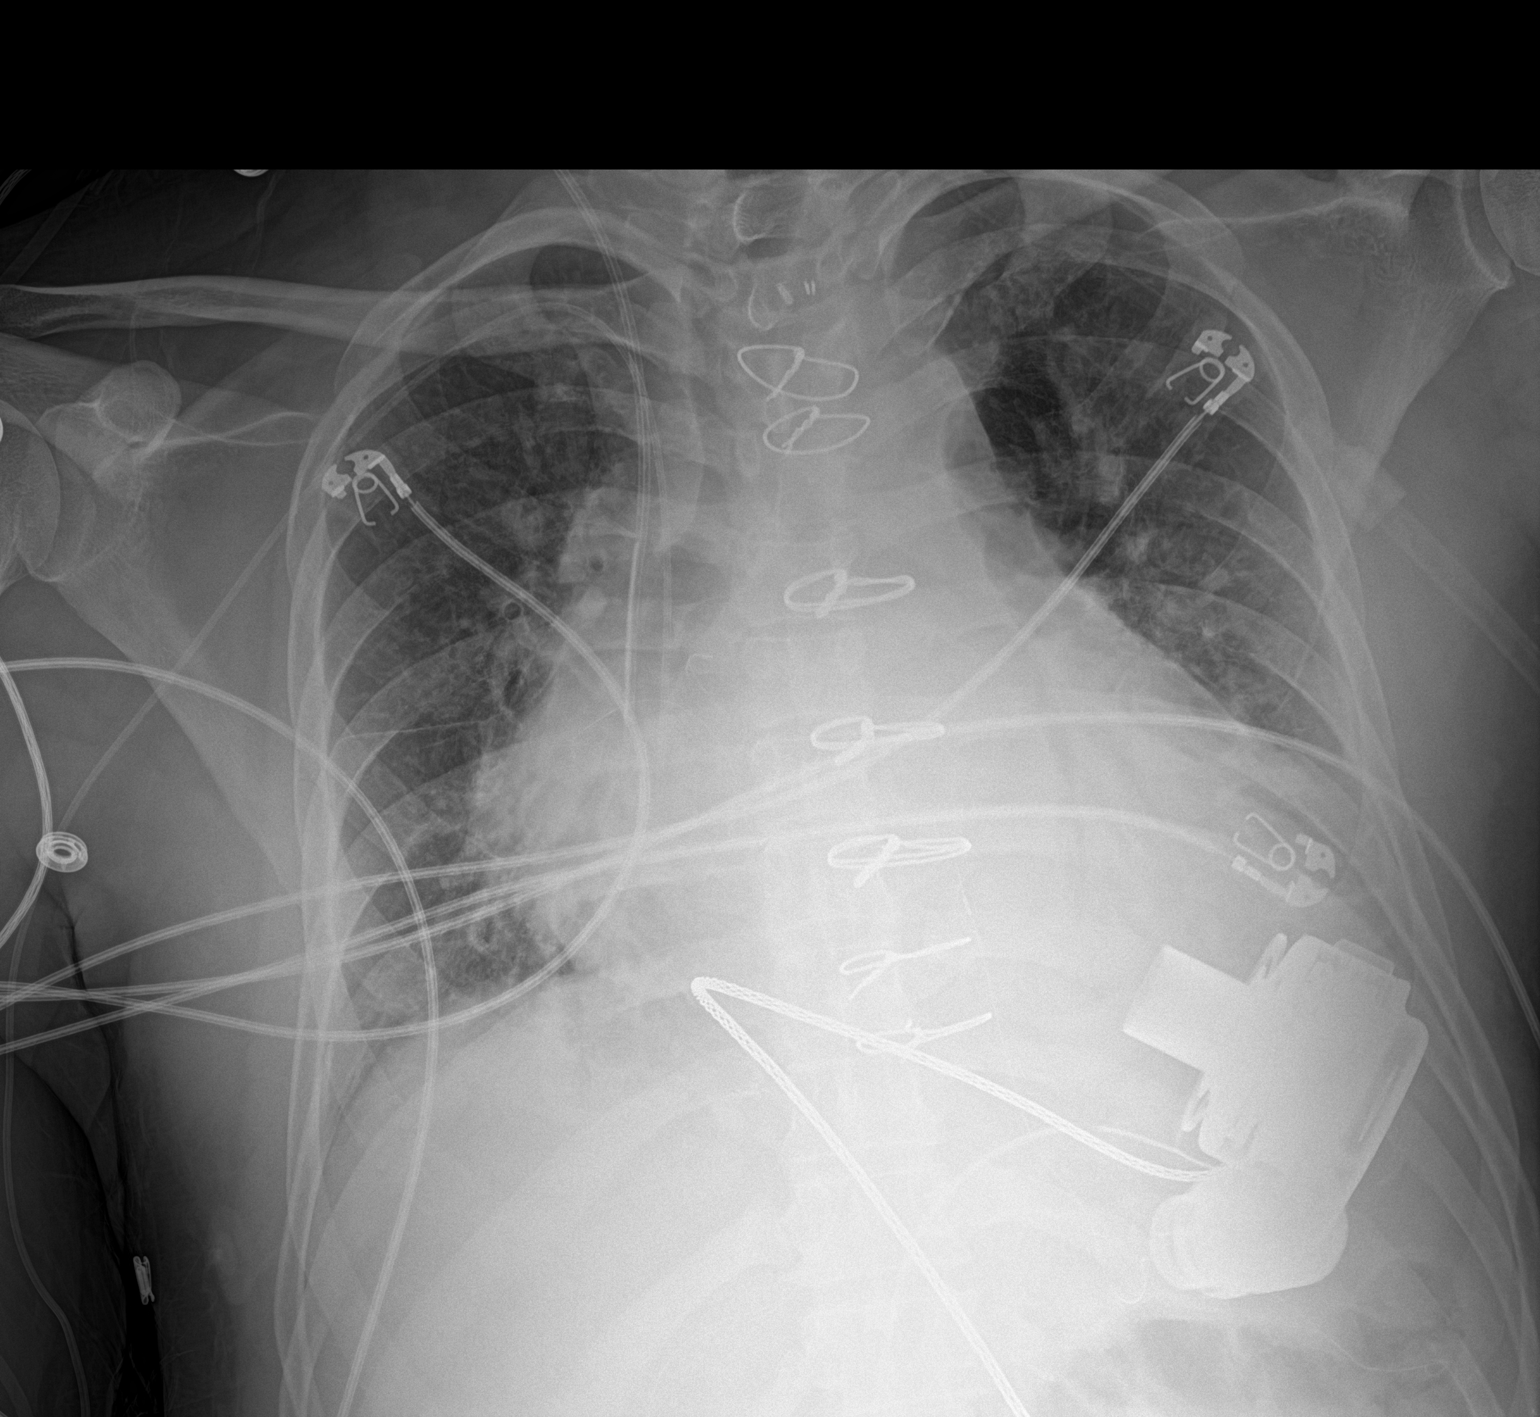

[1 of 1 positions shown; findings below may reference images not displayed]

FINDINGS: Stable LVAD location. Stable right IJ and PICC line. No
pneumothorax. Cardiomegaly. No overt edema. Retrocardiac opacity on
the left is stable, possibly atelectasis.
IMPRESSION: 1. Support apparatus as above.  No acute interval change.

## 2019-04-04 IMAGING — DX DG CHEST 1V PORT
1 series · 1 of 1 positions shown · non-contrast
Comparison: Radiograph September 11, 2017.

CLINICAL DATA: Left ventricular assist device.

EXAM:
PORTABLE CHEST 1 VIEW

[chest ap]
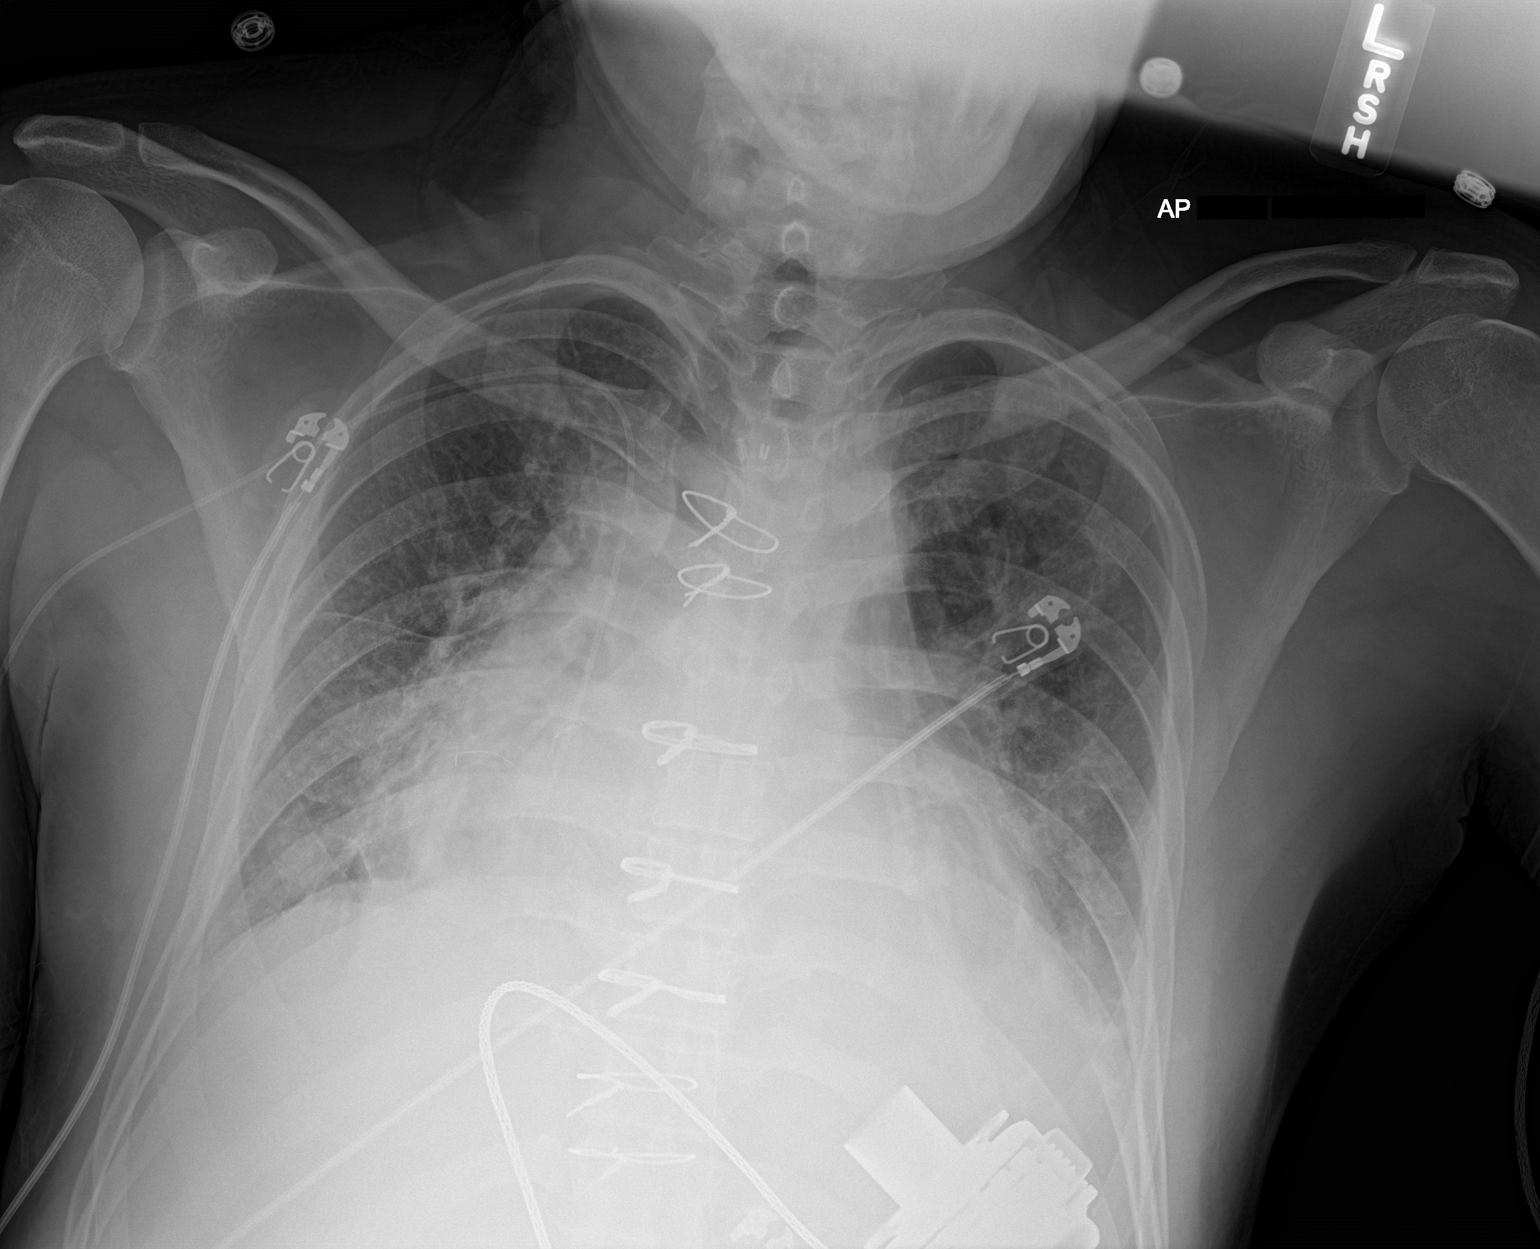

[1 of 1 positions shown; findings below may reference images not displayed]

FINDINGS: Stable cardiomegaly. Left ventricular assist device is unchanged in
position. Sternotomy wires are noted. No pneumothorax is noted.
Right-sided PICC line is again noted with tip in right atrium. No
pneumothorax is noted. Mild bibasilar atelectasis is noted with
probable mild left pleural effusion. Bony thorax is unremarkable.
Right internal jugular catheter is been removed.
IMPRESSION: Stable position of left ventricular assist device. Mild bibasilar
subsegmental atelectasis is noted with probable mild left pleural
effusion. Right-sided PICC line is noted with distal tip in right
atrium.

## 2019-04-05 ENCOUNTER — Encounter (HOSPITAL_COMMUNITY): Payer: Medicaid Other

## 2019-04-05 ENCOUNTER — Other Ambulatory Visit (HOSPITAL_COMMUNITY): Payer: Medicaid Other

## 2019-04-05 ENCOUNTER — Other Ambulatory Visit (HOSPITAL_COMMUNITY): Payer: Self-pay | Admitting: Adult Health

## 2019-04-05 ENCOUNTER — Other Ambulatory Visit: Payer: Self-pay | Admitting: Cardiology

## 2019-04-05 DIAGNOSIS — Z95811 Presence of heart assist device: Secondary | ICD-10-CM

## 2019-04-05 DIAGNOSIS — I5043 Acute on chronic combined systolic (congestive) and diastolic (congestive) heart failure: Secondary | ICD-10-CM

## 2019-04-05 IMAGING — DX DG CHEST 1V PORT
1 series · 1 of 1 positions shown · non-contrast
Comparison: Portable chest x-ray of 09/12/2016

CLINICAL DATA: Chest soreness today

EXAM:
PORTABLE CHEST 1 VIEW

[chest ap]
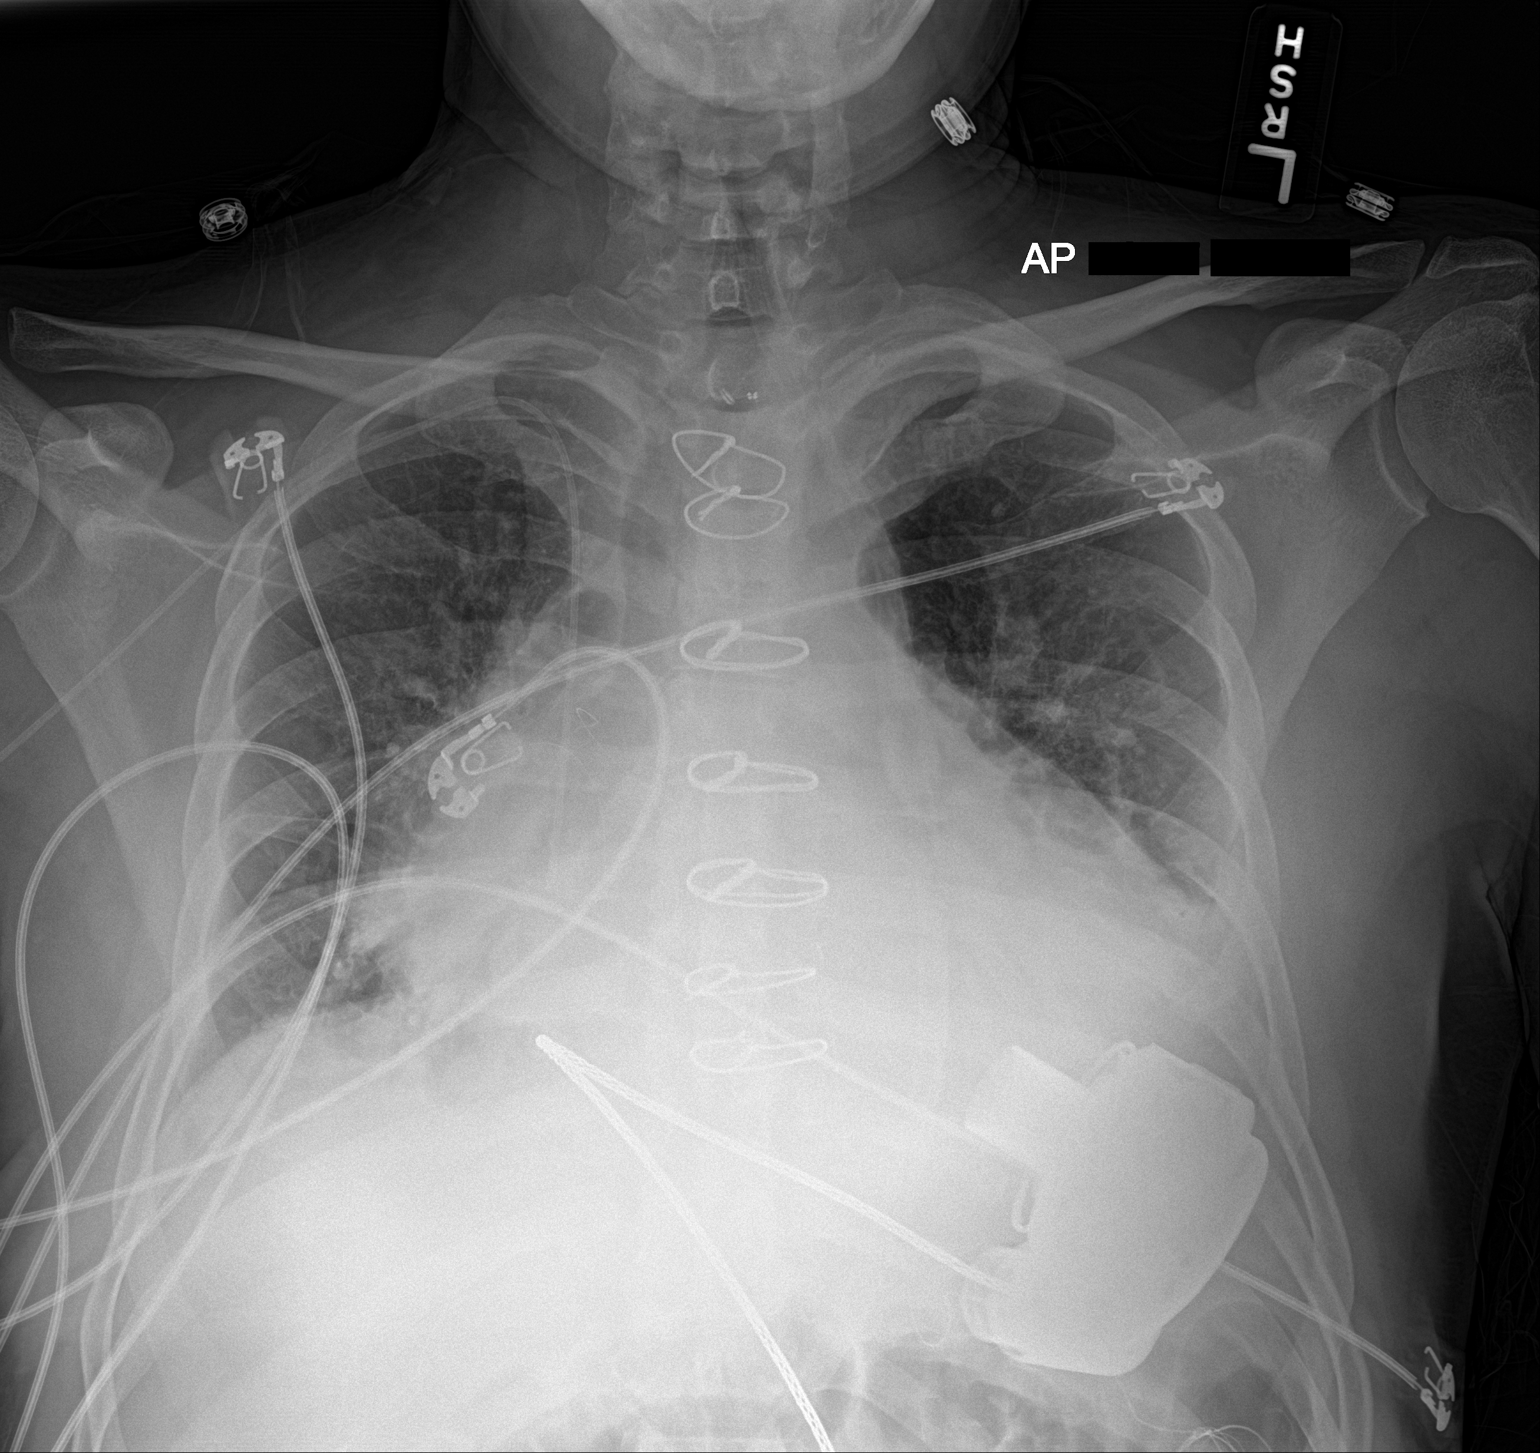

[1 of 1 positions shown; findings below may reference images not displayed]

FINDINGS: The lungs are not well aerated. Moderate cardiomegaly remains in
there may be mild pulmonary vascular congestion. A small left
effusion cannot be excluded. LVAD is noted. Median sternotomy
sutures are present. Right central venous line tip overlies the
expected SVC-RA junction.
IMPRESSION: 1. Poor aeration with moderate cardiomegaly, pulmonary vascular
congestion, possible small left effusion.
2. LVAD remains.

## 2019-04-06 ENCOUNTER — Other Ambulatory Visit (HOSPITAL_COMMUNITY): Payer: Self-pay | Admitting: *Deleted

## 2019-04-06 ENCOUNTER — Other Ambulatory Visit: Payer: Self-pay

## 2019-04-06 ENCOUNTER — Ambulatory Visit (HOSPITAL_COMMUNITY): Payer: Self-pay | Admitting: Pharmacist

## 2019-04-06 ENCOUNTER — Ambulatory Visit (HOSPITAL_COMMUNITY)
Admission: RE | Admit: 2019-04-06 | Discharge: 2019-04-06 | Disposition: A | Discharge status: Home / Self Care | Payer: Medicaid Other | Source: Ambulatory Visit | Attending: Cardiology | Admitting: Cardiology

## 2019-04-06 ENCOUNTER — Inpatient Hospital Stay (HOSPITAL_COMMUNITY)
Admission: RE | Admit: 2019-04-06 | Discharge: 2019-04-06 | Disposition: A | Discharge status: Home / Self Care | Payer: Medicaid Other | Source: Ambulatory Visit | Attending: Internal Medicine | Admitting: Internal Medicine

## 2019-04-06 DIAGNOSIS — Z95811 Presence of heart assist device: Secondary | ICD-10-CM

## 2019-04-06 DIAGNOSIS — Z95828 Presence of other vascular implants and grafts: Secondary | ICD-10-CM | POA: Insufficient documentation

## 2019-04-06 DIAGNOSIS — Z7901 Long term (current) use of anticoagulants: Secondary | ICD-10-CM

## 2019-04-06 LAB — PROTIME-INR
INR: 2 — ABNORMAL HIGH (ref 0.8–1.2)
Prothrombin Time: 22.8 seconds — ABNORMAL HIGH (ref 11.4–15.2)

## 2019-04-06 NOTE — Progress Notes (Signed)
LVAD INR 

## 2019-04-06 NOTE — Progress Notes (Signed)
Pt presents to [facility] for INR and dressing change. Says his mom parked in the lot underneath and he had a difficult time walking that distance to VAD clinic.   Reports he did not take any of his medications yesterday [place] son. He is unable to go for IV feraheme infusion today. Will try to get him rescheduled for next week.   Pt is requesting to go back to gauze dressings, reports the Sorbaview dressing makes him "itch".   Exit Site Care: Existing VAD dressing falling off, with a hole in the middle of the dressing with silver strip poking out. He has 4 anchors around dressing with tape "holding it in place." Dressing is very dirty. Existing VAD dressing removed and site care performed using sterile technique. Drive line exit site cleaned with Chlora prep applicators x 2, allowed to dry, and gauze dressing applied with silver strip applied. Drive line exit site incorporated, the velour is fully implanted at exit site; small amount yellow/tan crusty drainage noted on exterior of exit site. After cleansing area, unable to express any additional drainage for culture; no tunneling noted. No erythema, tenderness, or foul odor noted. Stabilization device present and accurately applied-replaced. Pt denies fever or chills. Pt wants to do his own dressing changes. Pt was informed that it would be better for [place] to do dressing changes every week opposed to the pt doing his own due to high risk of infection. Pt agrees to come in next week for dressing change. Provided him with 7 dressing kits for him to bring to each clinic appointment.     Return in one week for dressing change.    Emerson Monte RN [place] Coordinator  Office: [phone]  24/7 Pager: [phone]

## 2019-04-06 NOTE — Addendum Note (Signed)
Encounter addended by: Mertha Baars, RN on: 04/06/2019 12:50 PM  Actions taken: Clinical Note Signed

## 2019-04-10 ENCOUNTER — Ambulatory Visit (HOSPITAL_COMMUNITY)
Admission: RE | Admit: 2019-04-10 | Discharge: 2019-04-10 | Disposition: A | Discharge status: Home / Self Care | Payer: Medicaid Other | Source: Ambulatory Visit | Attending: Internal Medicine | Admitting: Internal Medicine

## 2019-04-10 ENCOUNTER — Ambulatory Visit (HOSPITAL_COMMUNITY): Payer: Self-pay | Admitting: Pharmacist

## 2019-04-10 ENCOUNTER — Other Ambulatory Visit: Payer: Self-pay

## 2019-04-10 DIAGNOSIS — Z95811 Presence of heart assist device: Secondary | ICD-10-CM | POA: Diagnosis present

## 2019-04-10 DIAGNOSIS — Z7901 Long term (current) use of anticoagulants: Secondary | ICD-10-CM | POA: Insufficient documentation

## 2019-04-10 DIAGNOSIS — I5022 Chronic systolic (congestive) heart failure: Secondary | ICD-10-CM

## 2019-04-10 DIAGNOSIS — D649 Anemia, unspecified: Secondary | ICD-10-CM

## 2019-04-10 LAB — PROTIME-INR
INR: 1.5 — ABNORMAL HIGH (ref 0.8–1.2)
Prothrombin Time: 18.2 seconds — ABNORMAL HIGH (ref 11.4–15.2)

## 2019-04-10 MED ORDER — ENOXAPARIN SODIUM 40 MG/0.4ML ~~LOC~~ SOLN: 40.0000 mg | Freq: Two times a day (BID) | SUBCUTANEOUS | 0 refills | Status: DC

## 2019-04-10 MED ORDER — SODIUM CHLORIDE 0.9 % IV SOLN
510.0000 mg | INTRAVENOUS | Status: DC
  Administered 2019-04-10: 510 mg via INTRAVENOUS
  Filled 2019-04-10: qty 510

## 2019-04-10 NOTE — Addendum Note (Signed)
Encounter addended by: Christinia Gully, RN on: 04/10/2019 11:45 AM  Actions taken: Clinical Note Signed

## 2019-04-10 NOTE — Progress Notes (Signed)
Pt presents to [facility] for INR and dressing change. Pt arrive in wheelchair. He states that he did valet parking and couldn't walk here without getting SOB.  Pt using gauze dressings, reports the Sorbaview dressing makes him "itch".   Exit Site Care: Existing VAD dressing falling off. Dressing is very dirty. Existing VAD dressing removed and site care performed using sterile technique. Drive line exit site cleaned with Chlora prep applicators x 2, allowed to dry, and gauze dressing applied with bio patch applied. Drive line exit site incorporated, the velour is fully implanted at exit site; scant amount yellow/tan crusty drainage noted on exterior of exit site. No erythema, tenderness, or foul odor noted. Stabilization device present and accurately applied-replaced. Pt denies fever or chills.  Pt was informed that it would be better for [place] to do dressing changes every week opposed to the pt doing his own due to high risk of infection. Pt agrees to come in next week for dressing change. Will bring his daughter Janett Billow so that we can train her to perform weekly dressing changes. Pt has sufficient dressings at home.  Return in one week for dressing change.    [name] RN [place] Coordinator  Office: [phone]  24/7 Pager: [phone]

## 2019-04-10 NOTE — Progress Notes (Signed)
LVAD INR 

## 2019-04-11 ENCOUNTER — Other Ambulatory Visit (HOSPITAL_COMMUNITY): Payer: Self-pay | Admitting: *Deleted

## 2019-04-11 DIAGNOSIS — Z95811 Presence of heart assist device: Secondary | ICD-10-CM

## 2019-04-11 DIAGNOSIS — Z7901 Long term (current) use of anticoagulants: Secondary | ICD-10-CM

## 2019-04-13 ENCOUNTER — Encounter (HOSPITAL_COMMUNITY): Payer: Medicaid Other

## 2019-04-16 ENCOUNTER — Other Ambulatory Visit (HOSPITAL_COMMUNITY): Payer: Self-pay | Admitting: *Deleted

## 2019-04-17 ENCOUNTER — Encounter (HOSPITAL_COMMUNITY): Payer: Medicaid Other

## 2019-04-17 ENCOUNTER — Other Ambulatory Visit (HOSPITAL_COMMUNITY): Payer: Medicaid Other

## 2019-04-18 ENCOUNTER — Other Ambulatory Visit (HOSPITAL_COMMUNITY): Payer: Medicaid Other

## 2019-04-18 ENCOUNTER — Inpatient Hospital Stay (HOSPITAL_COMMUNITY): Admission: RE | Admit: 2019-04-18 | Payer: Medicaid Other | Source: Ambulatory Visit

## 2019-04-20 ENCOUNTER — Other Ambulatory Visit (HOSPITAL_COMMUNITY): Payer: Self-pay | Admitting: *Deleted

## 2019-04-20 ENCOUNTER — Other Ambulatory Visit: Payer: Self-pay

## 2019-04-20 ENCOUNTER — Ambulatory Visit (HOSPITAL_COMMUNITY)
Admission: RE | Admit: 2019-04-20 | Discharge: 2019-04-20 | Disposition: A | Discharge status: Home / Self Care | Payer: Medicaid Other | Source: Ambulatory Visit | Attending: Internal Medicine | Admitting: Internal Medicine

## 2019-04-20 ENCOUNTER — Other Ambulatory Visit (HOSPITAL_COMMUNITY): Payer: Self-pay | Admitting: Unknown Physician Specialty

## 2019-04-20 DIAGNOSIS — D649 Anemia, unspecified: Secondary | ICD-10-CM | POA: Diagnosis present

## 2019-04-20 DIAGNOSIS — Z95811 Presence of heart assist device: Secondary | ICD-10-CM | POA: Diagnosis present

## 2019-04-20 DIAGNOSIS — I5022 Chronic systolic (congestive) heart failure: Secondary | ICD-10-CM | POA: Diagnosis present

## 2019-04-20 DIAGNOSIS — Z7901 Long term (current) use of anticoagulants: Secondary | ICD-10-CM

## 2019-04-20 MED ORDER — SODIUM CHLORIDE 0.9 % IV SOLN
510.0000 mg | INTRAVENOUS | Status: DC
  Administered 2019-04-20: 510 mg via INTRAVENOUS
  Filled 2019-04-20: qty 17

## 2019-04-20 MED ORDER — DOXYCYCLINE MONOHYDRATE 100 MG PO TABS: 100.0000 mg | ORAL_TABLET | Freq: Two times a day (BID) | ORAL | 0 refills | Status: DC

## 2019-04-20 NOTE — Progress Notes (Signed)
Patient presents to clinic today for drive line exit wound care. Existing VAD dressing removed and site care performed using sterile technique. Drive line exit site cleaned with Chlora prep applicators x 2, allowed to dry, and gauze dressing with silver strip re-applied. Exit site healed and incorporated, the velour is fully implanted at exit site. Moderate amount of yellow/green drainage. Could not express drainage for culture. No redness, tenderness,  foul odor or rash noted. Gauze covered w/tegaderm. Drive line anchor re-applied. Pt denies fever or chills.   Return in 1 week for another dressing change per standard of care. INR to be repeated in 1-2 weeks pending results per anticoagulation protocol.  Pt given Doxycyline 100 mg bid for 2 weeks for suspected driveline infection. Pt instructed to come to Monday for full visit and dressing change.   Emerson Monte RN, BSN VAD Coordinator 24/7 Pager [phone]

## 2019-04-23 ENCOUNTER — Other Ambulatory Visit (HOSPITAL_COMMUNITY): Payer: Self-pay | Admitting: *Deleted

## 2019-04-23 ENCOUNTER — Inpatient Hospital Stay (HOSPITAL_COMMUNITY): Payer: Medicaid Other

## 2019-04-23 ENCOUNTER — Other Ambulatory Visit: Payer: Self-pay

## 2019-04-23 ENCOUNTER — Encounter (HOSPITAL_COMMUNITY): Payer: Self-pay

## 2019-04-23 ENCOUNTER — Ambulatory Visit (HOSPITAL_COMMUNITY)
Admission: RE | Admit: 2019-04-23 | Discharge: 2019-04-23 | Disposition: A | Discharge status: Home / Self Care | Payer: Medicaid Other | Source: Ambulatory Visit | Attending: Cardiology | Admitting: Cardiology

## 2019-04-23 ENCOUNTER — Ambulatory Visit (HOSPITAL_COMMUNITY)
Admission: RE | Admit: 2019-04-23 | Discharge: 2019-04-23 | Disposition: A | Discharge status: Home / Self Care | Payer: Medicaid Other | Source: Ambulatory Visit | Attending: Internal Medicine | Admitting: Internal Medicine

## 2019-04-23 ENCOUNTER — Inpatient Hospital Stay (HOSPITAL_COMMUNITY)
Admission: AD | Admit: 2019-04-23 | Discharge: 2019-05-01 | DRG: 314 | Disposition: A | Discharge status: Home / Self Care | Payer: Medicaid Other | Source: Ambulatory Visit | Attending: Internal Medicine | Admitting: Internal Medicine

## 2019-04-23 VITALS — BP 116/57 | HR 90 | Temp 99.5°F | Wt 243.4 lb

## 2019-04-23 DIAGNOSIS — Z7901 Long term (current) use of anticoagulants: Secondary | ICD-10-CM | POA: Insufficient documentation

## 2019-04-23 DIAGNOSIS — T827XXA Infection and inflammatory reaction due to other cardiac and vascular devices, implants and grafts, initial encounter: Secondary | ICD-10-CM | POA: Diagnosis present

## 2019-04-23 DIAGNOSIS — Y831 Surgical operation with implant of artificial internal device as the cause of abnormal reaction of the patient, or of later complication, without mention of misadventure at the time of the procedure: Secondary | ICD-10-CM | POA: Diagnosis present

## 2019-04-23 DIAGNOSIS — I428 Other cardiomyopathies: Secondary | ICD-10-CM | POA: Diagnosis present

## 2019-04-23 DIAGNOSIS — Z4801 Encounter for change or removal of surgical wound dressing: Secondary | ICD-10-CM | POA: Insufficient documentation

## 2019-04-23 DIAGNOSIS — Z8249 Family history of ischemic heart disease and other diseases of the circulatory system: Secondary | ICD-10-CM

## 2019-04-23 DIAGNOSIS — L03311 Cellulitis of abdominal wall: Secondary | ICD-10-CM | POA: Diagnosis present

## 2019-04-23 DIAGNOSIS — F1010 Alcohol abuse, uncomplicated: Secondary | ICD-10-CM | POA: Diagnosis present

## 2019-04-23 DIAGNOSIS — K219 Gastro-esophageal reflux disease without esophagitis: Secondary | ICD-10-CM | POA: Diagnosis present

## 2019-04-23 DIAGNOSIS — I5023 Acute on chronic systolic (congestive) heart failure: Secondary | ICD-10-CM | POA: Diagnosis not present

## 2019-04-23 DIAGNOSIS — Z95811 Presence of heart assist device: Secondary | ICD-10-CM

## 2019-04-23 DIAGNOSIS — J45909 Unspecified asthma, uncomplicated: Secondary | ICD-10-CM | POA: Diagnosis present

## 2019-04-23 DIAGNOSIS — R1900 Intra-abdominal and pelvic swelling, mass and lump, unspecified site: Secondary | ICD-10-CM | POA: Insufficient documentation

## 2019-04-23 DIAGNOSIS — Z9989 Dependence on other enabling machines and devices: Secondary | ICD-10-CM | POA: Diagnosis not present

## 2019-04-23 DIAGNOSIS — D649 Anemia, unspecified: Secondary | ICD-10-CM

## 2019-04-23 DIAGNOSIS — G4733 Obstructive sleep apnea (adult) (pediatric): Secondary | ICD-10-CM | POA: Diagnosis present

## 2019-04-23 DIAGNOSIS — Z20828 Contact with and (suspected) exposure to other viral communicable diseases: Secondary | ICD-10-CM | POA: Diagnosis present

## 2019-04-23 DIAGNOSIS — R109 Unspecified abdominal pain: Secondary | ICD-10-CM | POA: Insufficient documentation

## 2019-04-23 DIAGNOSIS — N179 Acute kidney failure, unspecified: Secondary | ICD-10-CM | POA: Diagnosis not present

## 2019-04-23 DIAGNOSIS — I110 Hypertensive heart disease with heart failure: Secondary | ICD-10-CM | POA: Diagnosis present

## 2019-04-23 DIAGNOSIS — I5042 Chronic combined systolic (congestive) and diastolic (congestive) heart failure: Secondary | ICD-10-CM | POA: Diagnosis not present

## 2019-04-23 DIAGNOSIS — Z79899 Other long term (current) drug therapy: Secondary | ICD-10-CM | POA: Diagnosis not present

## 2019-04-23 DIAGNOSIS — F17200 Nicotine dependence, unspecified, uncomplicated: Secondary | ICD-10-CM | POA: Diagnosis present

## 2019-04-23 DIAGNOSIS — R0602 Shortness of breath: Secondary | ICD-10-CM | POA: Insufficient documentation

## 2019-04-23 DIAGNOSIS — B9561 Methicillin susceptible Staphylococcus aureus infection as the cause of diseases classified elsewhere: Secondary | ICD-10-CM | POA: Diagnosis present

## 2019-04-23 DIAGNOSIS — I5022 Chronic systolic (congestive) heart failure: Secondary | ICD-10-CM

## 2019-04-23 DIAGNOSIS — F17211 Nicotine dependence, cigarettes, in remission: Secondary | ICD-10-CM | POA: Diagnosis not present

## 2019-04-23 DIAGNOSIS — T829XXA Unspecified complication of cardiac and vascular prosthetic device, implant and graft, initial encounter: Secondary | ICD-10-CM

## 2019-04-23 DIAGNOSIS — I340 Nonrheumatic mitral (valve) insufficiency: Secondary | ICD-10-CM | POA: Diagnosis not present

## 2019-04-23 LAB — COMPREHENSIVE METABOLIC PANEL
ALT: 21 U/L (ref 0–44)
AST: 29 U/L (ref 15–41)
Albumin: 3.3 g/dL — ABNORMAL LOW (ref 3.5–5.0)
Alkaline Phosphatase: 89 U/L (ref 38–126)
Anion gap: 10 (ref 5–15)
BUN: 16 mg/dL (ref 6–20)
CO2: 19 mmol/L — ABNORMAL LOW (ref 22–32)
Calcium: 8.2 mg/dL — ABNORMAL LOW (ref 8.9–10.3)
Chloride: 104 mmol/L (ref 98–111)
Creatinine, Ser: 1.26 mg/dL — ABNORMAL HIGH (ref 0.61–1.24)
GFR calc Af Amer: >60 mL/min (ref >60)
GFR calc non Af Amer: >60 mL/min (ref >60)
Glucose, Bld: 165 mg/dL — ABNORMAL HIGH (ref 70–99)
Potassium: 3.5 mmol/L (ref 3.5–5.1)
Sodium: 133 mmol/L — ABNORMAL LOW (ref 135–145)
Total Bilirubin: 2.5 mg/dL — ABNORMAL HIGH (ref 0.3–1.2)
Total Protein: 7.8 g/dL (ref 6.5–8.1)

## 2019-04-23 LAB — PROTIME-INR
INR: 1.6 — ABNORMAL HIGH (ref 0.8–1.2)
Prothrombin Time: 19.1 seconds — ABNORMAL HIGH (ref 11.4–15.2)

## 2019-04-23 LAB — CBC
HCT: 34.5 % — ABNORMAL LOW (ref 39.0–52.0)
Hemoglobin: 10.4 g/dL — ABNORMAL LOW (ref 13.0–17.0)
MCH: 26.8 pg (ref 26.0–34.0)
MCHC: 30.1 g/dL (ref 30.0–36.0)
MCV: 88.9 fL (ref 80.0–100.0)
Platelets: 157 10*3/uL (ref 150–400)
RBC: 3.88 MIL/uL — ABNORMAL LOW (ref 4.22–5.81)
RDW: 20.9 % — ABNORMAL HIGH (ref 11.5–15.5)
WBC: 17.2 10*3/uL — ABNORMAL HIGH (ref 4.0–10.5)
nRBC: 0.5 % — ABNORMAL HIGH (ref 0.0–0.2)

## 2019-04-23 LAB — RESPIRATORY PANEL BY RT PCR (FLU A&B, COVID)
Influenza A by PCR: NEGATIVE
Influenza B by PCR: NEGATIVE
SARS Coronavirus 2 by RT PCR: NEGATIVE

## 2019-04-23 LAB — HEPARIN LEVEL (UNFRACTIONATED): Heparin Unfractionated: <0.1 IU/mL — ABNORMAL LOW (ref 0.30–0.70)

## 2019-04-23 LAB — BRAIN NATRIURETIC PEPTIDE: B Natriuretic Peptide: 709.6 pg/mL — ABNORMAL HIGH (ref 0.0–100.0)

## 2019-04-23 LAB — LACTATE DEHYDROGENASE: LDH: 284 U/L — ABNORMAL HIGH (ref 98–192)

## 2019-04-23 LAB — MAGNESIUM: Magnesium: 1.6 mg/dL — ABNORMAL LOW (ref 1.7–2.4)

## 2019-04-23 MED ORDER — SILDENAFIL CITRATE 20 MG PO TABS
20.0000 mg | ORAL_TABLET | Freq: Three times a day (TID) | ORAL | Status: DC
  Administered 2019-04-23 – 2019-05-01 (×24): 20 mg via ORAL
  Filled 2019-04-23 (×24): qty 1

## 2019-04-23 MED ORDER — POTASSIUM CHLORIDE CRYS ER 20 MEQ PO TBCR
40.0000 meq | EXTENDED_RELEASE_TABLET | Freq: Once | ORAL | Status: AC
  Administered 2019-04-23: 40 meq via ORAL
  Filled 2019-04-23: qty 2

## 2019-04-23 MED ORDER — LINACLOTIDE 145 MCG PO CAPS
145.0000 ug | ORAL_CAPSULE | ORAL | Status: DC
  Filled 2019-04-23: qty 1

## 2019-04-23 MED ORDER — GABAPENTIN 600 MG PO TABS
300.0000 mg | ORAL_TABLET | Freq: Three times a day (TID) | ORAL | Status: DC
  Administered 2019-04-23 – 2019-05-01 (×23): 300 mg via ORAL
  Filled 2019-04-23 (×23): qty 1

## 2019-04-23 MED ORDER — POTASSIUM CHLORIDE CRYS ER 20 MEQ PO TBCR
20.0000 meq | EXTENDED_RELEASE_TABLET | Freq: Every day | ORAL | Status: DC
  Administered 2019-04-23 – 2019-05-01 (×9): 20 meq via ORAL
  Filled 2019-04-23 (×9): qty 1

## 2019-04-23 MED ORDER — SODIUM CHLORIDE 0.9 % IV SOLN
2.0000 g | Freq: Three times a day (TID) | INTRAVENOUS | Status: DC
  Administered 2019-04-23 – 2019-04-26 (×9): 2 g via INTRAVENOUS
  Filled 2019-04-23 (×15): qty 2

## 2019-04-23 MED ORDER — PANTOPRAZOLE SODIUM 40 MG PO TBEC
40.0000 mg | DELAYED_RELEASE_TABLET | Freq: Every day | ORAL | Status: DC
  Administered 2019-04-23 – 2019-05-01 (×9): 40 mg via ORAL
  Filled 2019-04-23 (×9): qty 1

## 2019-04-23 MED ORDER — ONDANSETRON HCL 4 MG/2ML IJ SOLN: 4.0000 mg | Freq: Four times a day (QID) | INTRAMUSCULAR | Status: DC | PRN

## 2019-04-23 MED ORDER — SODIUM CHLORIDE 0.9 % IV SOLN: 510.0000 mg | INTRAVENOUS | Status: DC

## 2019-04-23 MED ORDER — VANCOMYCIN HCL IN DEXTROSE 1-5 GM/200ML-% IV SOLN
1000.0000 mg | Freq: Two times a day (BID) | INTRAVENOUS | Status: DC
  Administered 2019-04-23 – 2019-04-26 (×6): 1000 mg via INTRAVENOUS
  Filled 2019-04-23 (×7): qty 200

## 2019-04-23 MED ORDER — HYDRALAZINE HCL 25 MG PO TABS
25.0000 mg | ORAL_TABLET | Freq: Three times a day (TID) | ORAL | Status: DC
  Administered 2019-04-23 – 2019-04-25 (×5): 25 mg via ORAL
  Filled 2019-04-23 (×5): qty 1

## 2019-04-23 MED ORDER — AMIODARONE HCL 200 MG PO TABS
200.0000 mg | ORAL_TABLET | Freq: Every day | ORAL | Status: DC
  Administered 2019-04-23 – 2019-05-01 (×9): 200 mg via ORAL
  Filled 2019-04-23 (×9): qty 1

## 2019-04-23 MED ORDER — HEPARIN (PORCINE) 25000 UT/250ML-% IV SOLN
1400.0000 [IU]/h | INTRAVENOUS | Status: DC
  Administered 2019-04-23: 1000 [IU]/h via INTRAVENOUS
  Administered 2019-04-24: 1500 [IU]/h via INTRAVENOUS
  Administered 2019-04-25: 1700 [IU]/h via INTRAVENOUS
  Administered 2019-04-25: 1500 [IU]/h via INTRAVENOUS
  Administered 2019-04-26 – 2019-04-28 (×4): 1800 [IU]/h via INTRAVENOUS
  Administered 2019-04-28: 1600 [IU]/h via INTRAVENOUS
  Administered 2019-04-29 – 2019-04-30 (×2): 1400 [IU]/h via INTRAVENOUS
  Filled 2019-04-23 (×12): qty 250

## 2019-04-23 MED ORDER — DOCUSATE SODIUM 100 MG PO CAPS
100.0000 mg | ORAL_CAPSULE | Freq: Three times a day (TID) | ORAL | Status: DC
  Administered 2019-04-23 – 2019-05-01 (×18): 100 mg via ORAL
  Filled 2019-04-23 (×22): qty 1

## 2019-04-23 MED ORDER — MAGNESIUM SULFATE 4 GM/100ML IV SOLN
4.0000 g | Freq: Once | INTRAVENOUS | Status: AC
  Administered 2019-04-23: 4 g via INTRAVENOUS
  Filled 2019-04-23: qty 100

## 2019-04-23 MED ORDER — ACETAMINOPHEN 325 MG PO TABS
650.0000 mg | ORAL_TABLET | ORAL | Status: DC | PRN
  Administered 2019-04-23 – 2019-04-25 (×5): 650 mg via ORAL
  Filled 2019-04-23 (×6): qty 2

## 2019-04-23 MED ORDER — TORSEMIDE 20 MG PO TABS
20.0000 mg | ORAL_TABLET | Freq: Every day | ORAL | Status: DC
  Administered 2019-04-24 – 2019-05-01 (×7): 20 mg via ORAL
  Filled 2019-04-23 (×9): qty 1

## 2019-04-23 MED ORDER — HYDROXYZINE HCL 10 MG/5ML PO SYRP
25.0000 mg | ORAL_SOLUTION | Freq: Three times a day (TID) | ORAL | Status: DC | PRN
  Administered 2019-04-23 – 2019-04-24 (×3): 25 mg via ORAL
  Filled 2019-04-23 (×7): qty 12.5

## 2019-04-23 NOTE — Progress Notes (Signed)
Pharmacy Antibiotic Note  Jonathan Wood is a 48 y.o. 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 Pharmacy has been consulted for vancomycin and cefepime dosing. Pt never started PTA doxycycline but now has fevers.  Plan: Vancomycin 1000mg  IV q12h Cefepime 2g IV q8h Follow cultures, renal function     Temp (24hrs), Avg:99.5 F (37.5 C), Min:99.5 F (37.5 C), Max:99.5 F (37.5 C)  Recent Labs  Lab 04/23/19 1350  WBC 17.2*  CREATININE 1.26*    Estimated Creatinine Clearance: 96.9 mL/min (A) (by C-G formula based on SCr of 1.26 mg/dL (H)).    No Known Allergies  Antimicrobials this admission: Vancomycin 12/7 >>  Cefepime 12/7 >>   Microbiology results: pending  Thank you for allowing pharmacy to be a part of this patient's care.  Arrie Senate, PharmD, BCPS Clinical Pharmacist [phone] Please check AMION for all [place] numbers 04/23/2019

## 2019-04-23 NOTE — Progress Notes (Signed)
ANTICOAGULATION CONSULT NOTE - Initial Consult  Pharmacy Consult for heparin Indication: LVAD  No Known Allergies  Patient Measurements:   Heparin Dosing Weight: 100kg  Vital Signs: Temp: 99.5 F (37.5 C) (12/07 1339) Temp Source: Oral (12/07 1339) BP: 116/57 (12/07 1345) Pulse Rate: 90 (12/07 1339)  Labs: Recent Labs    04/23/19 1350  HGB 10.4*  HCT 34.5*  PLT 157  LABPROT 19.1*  INR 1.Jonathan*  CREATININE 1.26*    Estimated Creatinine Clearance: 96.9 mL/min (A) (by C-G formula based on SCr of 1.26 mg/dL (H)).   Medical History: Past Medical History:  Diagnosis Date  . Asthma   . CHF (congestive heart failure) ([place])    a. 09/2016: EF 20-25% with cath showing normal cors  . GERD (gastroesophageal reflux disease)   . History of hiatal hernia   . OSA on CPAP 09/06/2018   Severe OSA with AHI 68/hr on CPAP at 12cm H2O    Assessment: Jonathan Wood with LVAD on warfarin PTA admitted with DLI. INR 1.Jonathan, pharmacy to hold warfarin and start IV heparin while awaiting CT.  Goal of Therapy:  Heparin level 0.3-0.5 units/ml Monitor platelets by anticoagulation protocol: Yes   Plan:  -Start IV heparin 1000 units/h -Check 6hr heparin level   Arrie Senate, PharmD, BCPS Clinical Pharmacist [phone] Please check AMION for all [facility] numbers 04/23/2019

## 2019-04-23 NOTE — Progress Notes (Signed)
ANTICOAGULATION CONSULT NOTE  Pharmacy Consult for heparin Indication: LVAD  No Known Allergies  Patient Measurements:   Heparin Dosing Weight: 100kg  Vital Signs: Temp: 97.7 F (36.5 C) (12/07 2000) Temp Source: Oral (12/07 2000) BP: 109/89 (12/07 2200) Pulse Rate: 91 (12/07 2200)  Labs: Recent Labs    04/23/19 1350 04/23/19 2155  HGB 10.4*  --   HCT 34.5*  --   PLT 157  --   LABPROT 19.1*  --   INR 1.6*  --   HEPARINUNFRC  --  <0.10*  CREATININE 1.26*  --     Estimated Creatinine Clearance: 96.9 mL/min (A) (by C-G formula based on SCr of 1.26 mg/dL (H)).  Assessment: 48 yo Male with LVAD, Coumadin on hold and INR subtherapeutic, for heparin  Goal of Therapy:  Heparin level 0.3-0.5 units/ml Monitor platelets by anticoagulation protocol: Yes   Plan:  Increase Heparin  1250 units/hr Check heparin level in 8 hours.   Phillis Knack, PharmD, BCPS  04/23/2019

## 2019-04-23 NOTE — Progress Notes (Addendum)
Patient presents for 1 mo f/u in VAD clinic today with is daughter Janett Billow. Denies any issues with his VAD or alarms.   Pt arrived via wheelchair. He is SOB and states that his "belly feels full." When pt stood up out of wheelchair to get to stretcher he allowed controller to dangle and drag beside him as he walked. Educated on importance of properly securing drive line and controller to avoid trauma or further infection.   C/o severe pain at drive line exit site. Abdomen swollen and tight. Redness streaking across abdomen and around drive line exit site. Per pt redness started on Saturday morning. Pain noted with palpation. Drive line dressing saturated with brown foul smelling drainage. He did not pick up prescription that was sent in on Friday for Doxycyline until Sunday night. He reports "I forgot." See dressing change below. Wound culture sent. WBC count today 17.2   Reports he has not taken any of his medication yet today. Last Torsemide dose was taken last Thursday prior to him going out of town.    Vital Signs:   Doppler Pressure: 120 Automatc BP: 116/57 (97) HR:  90 SPO2: 96% on RA  Weight: 243.8 lbs w/ eqt Last weight: 244 lb  VAD Indication: Destination Therapy   VAD interrogation & Equipment Management: Speed: 5600 Flow: 5.0 Power: 4.2w    PI: 2.9 Hct: 38 Alarms: multiple low voltage advisories. 12/3- no external power (due to power outage) 12/2- LOW VOLTAGE & no external power- he "accidentally" let his batteries die Events: rare  Fixed speed 5600 Low speed limit: 5300  Primary Controller:  Replace back up battery in 22 months.  Back up controller:   Replace back up battery in 15 months.  Annual Equipment Maintenance on UBC/PM was performed on 04/23/19.   I reviewed the LVAD parameters from today and compared the results to the patient's prior recorded data. LVAD interrogation was NEGATIVE for significant power changes, NEGATIVE for clinical alarms and STABLE for  PI events/speed drops. No programming changes were made and pump is functioning within specified parameters. Pt is performing daily controller and system monitor self tests along with completing weekly and monthly maintenance for LVAD equipment.  LVAD equipment check completed and is in good working order. Back-up equipment present.   Exit Site Care: Current dressing is saturated with brown foul smelling drainage. Existing VAD dressing removed and site care performed using sterile technique. Drive line exit site cleaned with Chlora prep applicators x 2, allowed to dry, and silver strip with gauze dressing applied. Exit site healed and incorporated, the velour is fully implanted at exit site. Copious amount of foul smelling yellow/brown drainage. Able to express small amount of drainage for culture. Diffuse redness and swelling across abdomen. Complaining of severe pain at exit site. Drive line anchor re-applied. Pt denies fever or chills.            Significant Events on VAD Support:   Device:none  BP & Labs:  Doppler 120 - reflecting modified systolic  Hgb 40.9 - No S/S of bleeding. Specifically denies melena/BRBPR or nosebleeds.  LDH 284 stable at  with established baseline of 200-300. Denies tea-colored urine. No power elevations noted on interrogation.    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    Patient Instructions:  1. Admit pt to 2H for IV antibiotics. CT scan of abdomen ordered- will complete after COVID test results.    Emerson Monte RN VAD Coordinator  Office: [phone]  24/7 Pager: [phone]

## 2019-04-23 NOTE — H&P (Addendum)
Advanced Heart Failure VAD History and Physical Note   PCP-Cardiologist: Arvilla Meres, MD   Reason for Admission: LVAD complication. Driveline infection  HPI:   Jonathan Wood is a 48 y.o. 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  Admitted 08/21/2018 with volume overload and anemia. Diuresed with IV lasix and transitioned to torsemide 20 mg daily. GI consulted EGD completed and showed gastritis and nonbleeding duodenal ulcers, colonoscopy with 5 polyps removed. Protonix increased to BID, he was started on PO iron,and hydrocortisone suppositories for hemorrhoid treatment. He received a total of 2 uPRBCs.  On 12/4 he was seen for a dressing change and there was moderate exudate. He was supposed to start doxycyline but never picked up prescriptions. He has not had a dressing changed since 04/20/19    Today he presented to follow up . Complaining of abdominal pain. Increased drainage noted on driveline. Mild SOB with exertion. Denies orthopnea. Denies BRBPR. Appetite has been fair. He has not had dressing change since 12/4. Increased exudate and odor.    LVAD INTERROGATION:  HeartMate III LVAD:  Flow 4.4  liters/min, speed 5600, power 4.2 , PI 3.9 . Multiple voltage advisories.  Review of Systems: [y] = yes, [ ]  = no   General: Weight gain [ ] ; Weight loss [ ] ; Anorexia [ ] ; Fatigue [Y ]; Fever [ ] ; Chills [ ] ; Weakness [ ]   Cardiac: Chest pain/pressure [ ] ; Resting SOB [ ] ; Exertional SOB [Y ]; Orthopnea [ ] ; Pedal Edema [ ] ; Palpitations [ ] ; Syncope [ ] ; Presyncope [ ] ; Paroxysmal nocturnal dyspnea[ ]   Pulmonary: Cough [ ] ; Wheezing[ ] ; Hemoptysis[ ] ; Sputum [ ] ; Snoring [ ]   GI: Vomiting[ ] ; Dysphagia[ ] ; Melena[ ] ; Hematochezia [ ] ; Heartburn[ ] ; Abdominal pain [Y ]; Constipation [ ] ; Diarrhea [ ] ; BRBPR [ ]   GU: Hematuria[ ] ; Dysuria [ ] ; Nocturia[ ]   Vascular: Pain in legs with  walking [ ] ; Pain in feet with lying flat [ ] ; Non-healing sores [ ] ; Stroke [ ] ; TIA [ ] ; Slurred speech [ ] ;  Neuro: Headaches[ ] ; Vertigo[ ] ; Seizures[ ] ; Paresthesias[ ] ;Blurred vision [ ] ; Diplopia [ ] ; Vision changes [ ]   Ortho/Skin: Arthritis [ ] ; Joint pain [Y ]; Muscle pain [ ] ; Joint swelling [ ] ; Back Pain [ ] ; Rash [ ]   Psych: Depression[ Y]; Anxiety[ ]   Heme: Bleeding problems [ ] ; Clotting disorders [ ] ; Anemia [Y ]  Endocrine: Diabetes [ ] ; Thyroid dysfunction[ ]     Home Medications Prior to Admission medications   Medication Sig Start Date End Date Taking? Authorizing Provider  amiodarone (PACERONE) 200 MG tablet Take 1 tablet (200 mg total) by mouth daily. 12/25/18   Bensimhon, , MD  citalopram (CELEXA) 10 MG tablet Take 1 tablet (10 mg total) by mouth daily. Patient not taking: Reported on 03/21/2019 09/20/18   Bensimhon, , MD  docusate sodium (COLACE) 100 MG capsule Take 1 capsule (100 mg total) by mouth 3 (three) times daily. 02/20/19   Bensimhon, , MD  doxycycline (ADOXA) 100 MG tablet Take 1 tablet (100 mg total) by mouth 2 (two) times daily for 14 days. 04/20/19 05/04/19  Bensimhon, , MD  enoxaparin (LOVENOX) 40 MG/0.4ML injection Inject 0.4 mLs (40 mg total) into the skin every 12 (twelve) hours. 04/10/19   Bensimhon, , MD  ferrous sulfate 325 (65 FE) MG tablet  Take 1 tablet (325 mg total) by mouth daily with breakfast. Patient not taking: Reported on 03/21/2019 08/29/18   Tonye Becketlegg, Amy D, NP  gabapentin (NEURONTIN) 300 MG capsule Take 1 capsule (300 mg total) by mouth 3 (three) times daily. 11/09/18   Bensimhon, Bevelyn Bucklesaniel R, MD  hydrALAZINE (APRESOLINE) 25 MG tablet Take 1.5 tablets (37.5 mg total) by mouth every 8 (eight) hours. Patient taking differently: Take 25 mg by mouth every 8 (eight) hours.  08/28/18   Clegg, Amy D, NP  hydrOXYzine (ATARAX/VISTARIL) 25 MG tablet Take 25 mg by mouth every 8 (eight) hours as needed for anxiety. 08/17/18    [provider]  linaclotide (LINZESS) 145 MCG CAPS capsule Take 1 capsule (145 mcg total) by mouth daily before breakfast. Patient taking differently: Take 145 mcg by mouth every other day.  10/10/18   Mansouraty, Netty StarringGabriel Jr., MD  pantoprazole (PROTONIX) 40 MG tablet Take 1 tablet (40 mg total) by mouth 2 (two) times daily. Patient taking differently: Take 40 mg by mouth daily.  08/28/18   Clegg, Amy D, NP  potassium chloride SA (KLOR-CON M20) 20 MEQ tablet Take 1 tablet (20 mEq total) by mouth daily. Patient not taking: Reported on 09/20/2018 02/09/18   Tonye Becketlegg, Amy D, NP  sildenafil (REVATIO) 20 MG tablet Take 1 tablet (20 mg total) by mouth 3 (three) times daily. 09/20/18   Bensimhon, Bevelyn Bucklesaniel R, MD  torsemide (DEMADEX) 20 MG tablet TAKE 1 TABLET BY MOUTH DAILY FOR 30 DAYS 04/05/19   Clegg, Amy D, NP  warfarin (COUMADIN) 5 MG tablet TAKE 1 AND 1/2 TABLETS BY MOUTH ON WEDNESDAY, AND FRIDAY AT 6:00 PM. TAKE 1 TABLET BY MOUTH ON SUNDAY, MONDAY, TUESDAY, THURSDAY, AND SATURDAY 04/06/19   Bensimhon, Bevelyn Bucklesaniel R, MD    Past Medical History: Past Medical History:  Diagnosis Date  . Asthma   . CHF (congestive heart failure) (HCC)    a. 09/2016: EF 20-25% with cath showing normal cors  . GERD (gastroesophageal reflux disease)   . History of hiatal hernia   . OSA on CPAP 09/06/2018   Severe OSA with AHI 68/hr on CPAP at 12cm H2O    Past Surgical History: Past Surgical History:  Procedure Laterality Date  . BIOPSY  08/23/2018   Procedure: BIOPSY;  Surgeon: Meridee ScoreMansouraty, Netty StarringGabriel Jr., MD;  [facility];  Service: Gastroenterology;;  . COLONOSCOPY N/A 08/23/2018   Procedure: COLONOSCOPY;  Surgeon: Lemar LoftyMansouraty, Gabriel Jr., MD;  [facility];  Service: Gastroenterology;  Laterality: N/A;  . ENTEROSCOPY N/A 08/23/2018   Procedure: ENTEROSCOPY;  Surgeon: Meridee ScoreMansouraty, Netty StarringGabriel Jr., MD;  [facility];  Service: Gastroenterology;  Laterality: N/A;  . HEMOSTASIS CLIP PLACEMENT  08/23/2018    Procedure: HEMOSTASIS CLIP PLACEMENT;  Surgeon: Lemar LoftyMansouraty, Gabriel Jr., MD;  [facility];  Service: Gastroenterology;;  . IABP INSERTION N/A 09/05/2017   Procedure: IABP INSERTION;  Surgeon: Dolores PattyBensimhon, Daniel R, MD;  [facility]: [facility] IN[facility];  Service: Cardiovascular;  Laterality: N/A;  . INSERTION OF IMPLANTABLE LEFT VENTRICULAR ASSIST DEVICE N/A 09/06/2017   Procedure: INSERTION OF IMPLANTABLE LEFT VENTRICULAR ASSIST DEVICE - HM3;  Surgeon: Kerin PernaVan Trigt, Peter, MD;  [facility];  Service: Open Heart Surgery;  Laterality: N/A;  HM3  . NASAL FRACTURE SURGERY  1987  . POLYPECTOMY  08/23/2018   Procedure: POLYPECTOMY;  Surgeon: Mansouraty, Netty StarringGabriel Jr., MD;  [facility];  Service: Gastroenterology;;  . RIGHT HEART CATH N/A 08/30/2017   Procedure: RIGHT HEART CATH;  Surgeon: Dolores PattyBensimhon, Daniel R,  MD;  Location: [place] CV LAB;  Service: Cardiovascular;  Laterality: N/A;  . RIGHT/LEFT HEART CATH AND CORONARY ANGIOGRAPHY N/A 10/08/2016   Procedure: Right/Left Heart Cath and Coronary Angiography;  Surgeon: Dixie Dials, MD;  Location: [place] CV LAB;  Service: Cardiovascular;  Laterality: N/A;  . SUBMUCOSAL TATTOO INJECTION  08/23/2018   Procedure: SUBMUCOSAL TATTOO INJECTION;  Surgeon: Irving Copas., MD;  Location: [place];  Service: Gastroenterology;;  . TEE WITHOUT CARDIOVERSION N/A 09/06/2017   Procedure: TRANSESOPHAGEAL ECHOCARDIOGRAM (TEE);  Surgeon: Prescott Gum, Collier Salina, MD;  Location: [place];  Service: Open Heart Surgery;  Laterality: N/A;    Family History: Family History  Problem Relation Age of Onset  . Hypertension Father   . Colon cancer Neg Hx   . Esophageal cancer Neg Hx   . Inflammatory bowel disease Neg Hx   . Liver disease Neg Hx   . Pancreatic cancer Neg Hx   . Rectal cancer Neg Hx   . Stomach cancer Neg Hx     Social History: Social History   Socioeconomic History  . Marital status: Legally Separated    Spouse name: Not on file   . Number of children: Not on file  . Years of education: Not on file  . Highest education level: Not on file  Occupational History  . Not on file  Social Needs  . Financial resource strain: Not on file  . Food insecurity    Worry: Not on file    Inability: Not on file  . Transportation needs    Medical: Not on file    Non-medical: Not on file  Tobacco Use  . Smoking status: Current Every Day Smoker    Packs/day: 0.50    Years: 25.00    Pack years: 12.50    Types: Cigarettes  . Smokeless tobacco: Never Used  . Tobacco comment: quit 1 week  Substance and Sexual Activity  . Alcohol use: Yes    Comment: occas  . Drug use: No  . Sexual activity: Yes  Lifestyle  . Physical activity    Days per week: Not on file    Minutes per session: Not on file  . Stress: Not on file  Relationships  . Social Herbalist on phone: Not on file    Gets together: Not on file    Attends religious service: Not on file    Active member of club or organization: Not on file    Attends meetings of clubs or organizations: Not on file    Relationship status: Not on file  Other Topics Concern  . Not on file  Social History Narrative  . Not on file    Allergies:  No Known Allergies  Objective:    Vital Signs:   BP: ()/()  Arterial Line BP: ()/()    There were no vitals filed for this visit.  Mean arterial Pressure 100  Physical Exam    General:  No resp difficulty HEENT: Normal Neck: supple. JVP 9-10  . Carotids 2+ bilat; no bruits. No lymphadenopathy or thyromegaly appreciated. Cor: Mechanical heart sounds with LVAD hum present. Lungs: Clear Abdomen: Erythema, tender, distended. No hepatosplenomegaly. No bruits or masses. Good bowel sounds. Driveline: Copious purulent exudate. Extremities: no cyanosis, clubbing, rash, edema Neuro: alert & orientedx3, cranial nerves grossly intact. moves all 4 extremities w/o difficulty. Affect pleasant   Telemetry  Sinus Tach 100s     EKG   N/A  Labs    Basic  Metabolic Panel: No results for input(s): NA, K, CL, CO2, GLUCOSE, BUN, CREATININE, CALCIUM, MG, PHOS in the last 168 hours.  Liver Function Tests: No results for input(s): AST, ALT, ALKPHOS, BILITOT, PROT, ALBUMIN in the last 168 hours. No results for input(s): LIPASE, AMYLASE in the last 168 hours. No results for input(s): AMMONIA in the last 168 hours.  CBC: No results for input(s): WBC, NEUTROABS, HGB, HCT, MCV, PLT in the last 168 hours.  Cardiac Enzymes: No results for input(s): CKTOTAL, CKMB, CKMBINDEX, TROPONINI in the last 168 hours.  BNP: BNP (last 3 results) Recent Labs    03/21/19 1415  BNP 426.9*    ProBNP (last 3 results) No results for input(s): PROBNP in the last 8760 hours.   CBG: No results for input(s): GLUCAP in the last 168 hours.  Coagulation Studies: No results for input(s): LABPROT, INR in the last 72 hours.  Other results: EKG:  Imaging     No results found.     Assessment/Plan:    1. LVAD Complication, Driveline infection.  -Check CBC. WBC 17.2 . COVID pending.  -  Low Temp 99.5.  -Check blood culture x2. Check wound culture.  -Check CT abd/pelvis.  -Start vanc and cefepime.  -CT surgery notified. Plan for debridement Thursday.   2. 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  - Volume status stable. Hold diuretics for now.  -Continue sildenafil for RV failure. No bb for that reason.  - Watch BP with infection.  - INR 1.6. Start Heparin drip. Hold coumadin until after debridement  Follow LDH and INR daily.   3. HTN Continue current meds.   4. Anemia Hgb 10.4  Check CBC   5. Hypomagnesemia Mag 1.6. Give 2 grams Mag now.    Admit for LVAD complication. CT surgery aware.   I reviewed the LVAD parameters from today, and compared the results to the patient's prior recorded data.  No programming changes were made.  The LVAD is functioning within specified parameters.  The patient  performs LVAD self-test daily.  LVAD interrogation was negative for any significant power changes, alarms or PI events/speed drops.  LVAD equipment check completed and is in good working order.  Back-up equipment present.   LVAD education done on emergency procedures and precautions and reviewed exit site care.  Length of Stay: 0  Tonye Becket, NP 04/23/2019, 1:38 PM  VAD Team Pager [phone] (7am - 7am) +++VAD ISSUES ONLY+++   Advanced Heart Failure Team Pager [phone] (M-F; 7a - 4p)  Please contact [facility] for night-coverage after hours (4p -7a ) and weekends on [email or web address] for all non- LVAD Issues  Patient seen and examined with the above-signed Advanced Practice Provider and/or Housestaff. I personally reviewed laboratory data, imaging studies and relevant notes. I independently examined the patient and formulated the important aspects of the plan. I have edited the note to reflect any of my changes or salient points. I have personally discussed the plan with the patient and/or family.  48 y/o Wood with severe systolic HF due to NICM s/p HM III VAD. Presented to VAD clinic today with abdominal pain and drainage at DL exit site. + low grade fevers. Has not been careful with avoiding DL trauma.   General:  NAD.  HEENT: normal  Neck: supple. JVP 8-9 Carotids 2+ bilat; no bruits. No lymphadenopathy or thryomegaly appreciated. Cor: LVAD hum.  Lungs: Clear. Abdomen: obese soft, mildly distended. No hepatosplenomegaly. No bruits or masses. Good bowel  sounds. Driveline site with copious drainage and large area surrounding skin erythema c/w cellulitis. Unable to tunnel Extremities: no cyanosis, clubbing, rash. Warm no edema  Neuro: alert & oriented x 3. No focal deficits. Moves all 4 without problem   He has severe DL infection with overlying cellulitis. Ab CT obtained and reviewed personally shows changes consistent with primarily superficial infection into fat without clear evidence of  abscess.   Cultures obtained. Admit for IV abx. D/w Dr. Donata ClayVan Trigt. Probable debridement in OR Thursday.   VAD interrogated personally. Parameters stable.  Arvilla Meresaniel Bensimhon, MD  7:36 PM

## 2019-04-24 ENCOUNTER — Encounter (HOSPITAL_COMMUNITY): Payer: Medicaid Other

## 2019-04-24 ENCOUNTER — Inpatient Hospital Stay (HOSPITAL_COMMUNITY): Payer: Medicaid Other

## 2019-04-24 ENCOUNTER — Encounter (HOSPITAL_COMMUNITY): Payer: Self-pay

## 2019-04-24 DIAGNOSIS — T829XXA Unspecified complication of cardiac and vascular prosthetic device, implant and graft, initial encounter: Secondary | ICD-10-CM | POA: Diagnosis not present

## 2019-04-24 DIAGNOSIS — I340 Nonrheumatic mitral (valve) insufficiency: Secondary | ICD-10-CM | POA: Diagnosis not present

## 2019-04-24 DIAGNOSIS — T827XXA Infection and inflammatory reaction due to other cardiac and vascular devices, implants and grafts, initial encounter: Secondary | ICD-10-CM

## 2019-04-24 DIAGNOSIS — I5022 Chronic systolic (congestive) heart failure: Secondary | ICD-10-CM | POA: Diagnosis not present

## 2019-04-24 LAB — CBC
HCT: 33.8 % — ABNORMAL LOW (ref 39.0–52.0)
Hemoglobin: 10.1 g/dL — ABNORMAL LOW (ref 13.0–17.0)
MCH: 26.6 pg (ref 26.0–34.0)
MCHC: 29.9 g/dL — ABNORMAL LOW (ref 30.0–36.0)
MCV: 89.2 fL (ref 80.0–100.0)
Platelets: 169 10*3/uL (ref 150–400)
RBC: 3.79 MIL/uL — ABNORMAL LOW (ref 4.22–5.81)
RDW: 21.2 % — ABNORMAL HIGH (ref 11.5–15.5)
WBC: 15.3 10*3/uL — ABNORMAL HIGH (ref 4.0–10.5)
nRBC: 0.4 % — ABNORMAL HIGH (ref 0.0–0.2)

## 2019-04-24 LAB — HEPARIN LEVEL (UNFRACTIONATED)
Heparin Unfractionated: <0.1 IU/mL — ABNORMAL LOW (ref 0.30–0.70)
Heparin Unfractionated: 0.3 IU/mL (ref 0.30–0.70)

## 2019-04-24 LAB — BASIC METABOLIC PANEL
Anion gap: 10 (ref 5–15)
BUN: 18 mg/dL (ref 6–20)
CO2: 19 mmol/L — ABNORMAL LOW (ref 22–32)
Calcium: 8.3 mg/dL — ABNORMAL LOW (ref 8.9–10.3)
Chloride: 104 mmol/L (ref 98–111)
Creatinine, Ser: 1.17 mg/dL (ref 0.61–1.24)
GFR calc Af Amer: >60 mL/min (ref >60)
GFR calc non Af Amer: >60 mL/min (ref >60)
Glucose, Bld: 134 mg/dL — ABNORMAL HIGH (ref 70–99)
Potassium: 3.9 mmol/L (ref 3.5–5.1)
Sodium: 133 mmol/L — ABNORMAL LOW (ref 135–145)

## 2019-04-24 LAB — MRSA PCR SCREENING: MRSA by PCR: NEGATIVE

## 2019-04-24 LAB — HIV ANTIBODY (ROUTINE TESTING W REFLEX): HIV Screen 4th Generation wRfx: NONREACTIVE

## 2019-04-24 LAB — MAGNESIUM: Magnesium: 2.4 mg/dL (ref 1.7–2.4)

## 2019-04-24 LAB — ECHOCARDIOGRAM COMPLETE
Height: 74 in
Weight: 3896 oz

## 2019-04-24 LAB — SURGICAL PCR SCREEN
MRSA, PCR: NEGATIVE
Staphylococcus aureus: POSITIVE — AB

## 2019-04-24 LAB — PROTIME-INR
INR: 1.5 — ABNORMAL HIGH (ref 0.8–1.2)
Prothrombin Time: 18.1 seconds — ABNORMAL HIGH (ref 11.4–15.2)

## 2019-04-24 LAB — LACTATE DEHYDROGENASE: LDH: 282 U/L — ABNORMAL HIGH (ref 98–192)

## 2019-04-24 MED ORDER — CHLORHEXIDINE GLUCONATE CLOTH 2 % EX PADS
6.0000 | MEDICATED_PAD | Freq: Every day | CUTANEOUS | Status: DC
  Administered 2019-04-24 – 2019-05-01 (×5): 6 via TOPICAL

## 2019-04-24 MED ORDER — HYDROXYZINE HCL 10 MG/5ML PO SYRP
25.0000 mg | ORAL_SOLUTION | Freq: Three times a day (TID) | ORAL | Status: DC | PRN
  Filled 2019-04-24: qty 12.5

## 2019-04-24 MED ORDER — LINACLOTIDE 145 MCG PO CAPS
145.0000 ug | ORAL_CAPSULE | ORAL | Status: DC
  Administered 2019-04-24 – 2019-04-30 (×4): 145 ug via ORAL
  Filled 2019-04-24 (×4): qty 1

## 2019-04-24 NOTE — Progress Notes (Addendum)
Rounding Note.   PCP-Cardiologist: Arvilla Meresaniel Tehilla Coffel, MD   Reason for Admission: LVAD complication. Driveline infection  HPI:    Admitted from VAD clinic with drive line infection. Started vanc and cefepime.   Blood Cultures- NGTD  Wound Culture- Rare gram + cocci  Feeling better today. SOB with exertion.   LVAD INTERROGATION:  HeartMate III LVAD:  Flow 5   liters/min, speed 5600, power 4  , PI 3.    Allergies:  No Known Allergies  No current facility-administered medications on file prior to encounter.    Current Outpatient Medications on File Prior to Encounter  Medication Sig Dispense Refill   amiodarone (PACERONE) 200 MG tablet Take 1 tablet (200 mg total) by mouth daily. 90 tablet 3   citalopram (CELEXA) 10 MG tablet Take 1 tablet (10 mg total) by mouth daily. 90 tablet 5   docusate sodium (COLACE) 100 MG capsule Take 1 capsule (100 mg total) by mouth 3 (three) times daily. 90 capsule 2   doxycycline (ADOXA) 100 MG tablet Take 1 tablet (100 mg total) by mouth 2 (two) times daily for 14 days. (Patient not taking: Reported on 04/23/2019) 28 tablet 0   enoxaparin (LOVENOX) 40 MG/0.4ML injection Inject 0.4 mLs (40 mg total) into the skin every 12 (twelve) hours. (Patient not taking: Reported on 04/23/2019) 4 mL 0   ferrous sulfate 325 (65 FE) MG tablet Take 1 tablet (325 mg total) by mouth daily with breakfast. (Patient not taking: Reported on 03/21/2019) 30 tablet 3   gabapentin (NEURONTIN) 300 MG capsule Take 1 capsule (300 mg total) by mouth 3 (three) times daily. 90 capsule 6   hydrALAZINE (APRESOLINE) 25 MG tablet Take 1.5 tablets (37.5 mg total) by mouth every 8 (eight) hours. (Patient taking differently: Take 25 mg by mouth every 8 (eight) hours. ) 135 tablet 6   hydrOXYzine (ATARAX/VISTARIL) 25 MG tablet Take 25 mg by mouth every 8 (eight) hours as needed for anxiety.     linaclotide (LINZESS) 145 MCG CAPS capsule Take 1 capsule (145 mcg total) by mouth daily  before breakfast. (Patient taking differently: Take 145 mcg by mouth every other day. ) 30 capsule 6   pantoprazole (PROTONIX) 40 MG tablet Take 1 tablet (40 mg total) by mouth 2 (two) times daily. (Patient taking differently: Take 40 mg by mouth daily. ) 60 tablet 6   potassium chloride SA (KLOR-CON M20) 20 MEQ tablet Take 1 tablet (20 mEq total) by mouth daily. (Patient not taking: Reported on 09/20/2018) 90 tablet 3   sildenafil (REVATIO) 20 MG tablet Take 1 tablet (20 mg total) by mouth 3 (three) times daily. 90 tablet 5   torsemide (DEMADEX) 20 MG tablet TAKE 1 TABLET BY MOUTH DAILY FOR 30 DAYS 30 tablet 3   warfarin (COUMADIN) 5 MG tablet TAKE 1 AND 1/2 TABLETS BY MOUTH ON WEDNESDAY, AND FRIDAY AT 6:00 PM. TAKE 1 TABLET BY MOUTH ON SUNDAY, MONDAY, TUESDAY, THURSDAY, AND SATURDAY 180 tablet 3    ceFEPime (MAXIPIME) IV 2 g (04/24/19 1400)   heparin 1,500 Units/hr (04/24/19 1432)   vancomycin 1,000 mg (04/24/19 1757)    Objective:    Vital Signs:   Temp:  [97.6 F (36.4 C)-98.3 F (36.8 C)] 98.3 F (36.8 C) (12/08 0400) Pulse Rate:  [37-101] 95 (12/08 0700) BP: (102-111)/(83-97) 111/97 (12/08 0000) SpO2:  [89 %-100 %] 95 % (12/08 0700) Weight:  [110.5 kg] 110.5 kg (12/08 0500) Last BM Date: 04/22/19 Filed Weights   04/24/19 0500  Weight: 110.5 kg    Mean arterial Pressure 90-114  Physical Exam    Physical Exam: GENERAL: In bed. No acute distress. HEENT: normal  NECK: Supple, JVP 7-8   .  2+ bilaterally, no bruits.  No lymphadenopathy or thyromegaly appreciated.   CARDIAC:  Mechanical heart sounds with LVAD hum present.  LUNGS:  Clear to auscultation bilaterally.  ABDOMEN:  Decreased erythema on abdomen. Distended.   tender, positive bowel sounds x4.     LVAD exit site: Dressing intact no drainage.  EXTREMITIES:  Warm and dry, no cyanosis, clubbing, rash or edema  NEUROLOGIC:  Alert and oriented x 4.  No aphasia.  No dysarthria.  Affect pleasant.       Telemetry    Sinus Rhythm 90s personally reviewed.    EKG   N/A  Labs    Basic Metabolic Panel: Recent Labs  Lab 04/23/19 1350 04/24/19 0356  NA 133* 133*  K 3.5 3.9  CL 104 104  CO2 19* 19*  GLUCOSE 165* 134*  BUN 16 18  CREATININE 1.26* 1.17  CALCIUM 8.2* 8.3*  MG 1.6*  --     Liver Function Tests: Recent Labs  Lab 04/23/19 1350  AST 29  ALT 21  ALKPHOS 89  BILITOT 2.5*  PROT 7.8  ALBUMIN 3.3*   No results for input(s): LIPASE, AMYLASE in the last 168 hours. No results for input(s): AMMONIA in the last 168 hours.  CBC: Recent Labs  Lab 04/23/19 1350 04/24/19 0356  WBC 17.2* 15.3*  HGB 10.4* 10.1*  HCT 34.5* 33.8*  MCV 88.9 89.2  PLT 157 169    Cardiac Enzymes: No results for input(s): CKTOTAL, CKMB, CKMBINDEX, TROPONINI in the last 168 hours.  BNP: BNP (last 3 results) Recent Labs    03/21/19 1415 04/23/19 1350  BNP 426.9* 709.6*    ProBNP (last 3 results) No results for input(s): PROBNP in the last 8760 hours.   CBG: No results for input(s): GLUCAP in the last 168 hours.  Coagulation Studies: Recent Labs    04/23/19 1350 04/24/19 0356  LABPROT 19.1* 18.1*  INR 1.6* 1.5*    Other results: EKG:  Imaging    Ct Abdomen Pelvis Wo Contrast  Result Date: 04/23/2019 CLINICAL DATA:  Abdominal pain, fever, suspected LVAD complication, infection, abdominal swelling and tightness with redness streaking across abdomen from drive line exit site; history asthma, CHF, GERD EXAM: CT ABDOMEN AND PELVIS WITHOUT CONTRAST TECHNIQUE: Multidetector CT imaging of the abdomen and pelvis was performed following the standard protocol without IV contrast. Sagittal and coronal MPR images reconstructed from axial data set. No oral contrast administered COMPARISON:  02/06/2018 FINDINGS: Lower chest: Subsegmental atelectasis RIGHT lower lobe. Hepatobiliary: Contracted gallbladder. No focal hepatic abnormalities. Pancreas: Partial fatty replacement of pancreas. No  definite pancreatic mass or fluid collection. Spleen: Normal appearance Adrenals/Urinary Tract: Adrenal thickening without mass. Kidneys, ureters, and bladder normal appearance Stomach/Bowel: Appendix not visualized. Stomach decompressed. Radiodense medication within stomach and distal esophagus. Large and small bowel loops unremarkable. Vascular/Lymphatic: Atherosclerotic calcifications in the iliac arteries. Aorta normal caliber. Dilated cardiac chambers. LVAD at LEFT ventricular apex. Infiltrative changes are seen surrounding the line extending through the anterior abdominal wall, ill-defined, suspicious for infection. No focal/drainable fluid collection identified to suggest abscess. No adenopathy. Reproductive: Unremarkable prostate gland and seminal vesicles Other: No free air or free fluid. Small umbilical hernia containing fat. Infiltrative changes within subcutaneous fat at the LEFT lateral abdominal wall, nonspecific, could represent edema though as this is  focal, cannot exclude infection. Musculoskeletal: Unremarkable IMPRESSION: Ill-defined infiltrative changes surrounding the line which enters the anterior upper abdominal wall most likely representing infection. Asymmetric subcutaneous infiltrative changes are seen at the LEFT lateral abdominal wall, could represent edema or infection. Small umbilical hernia containing fat. No acute intra-abdominal or intrapelvic process identified. Enlargement of cardiac chambers with LVAD at LEFT ventricular apex. Electronically Signed   By: Ulyses Southward M.D.   On: 04/23/2019 17:04   Dg Chest Port 1 View  Result Date: 04/23/2019 CLINICAL DATA:  LVAD complication EXAM: PORTABLE CHEST 1 VIEW COMPARISON:  08/23/2018 FINDINGS: Cardiomegaly status post median sternotomy with LVAD. Both lungs are clear. The visualized skeletal structures are unremarkable. IMPRESSION: Cardiomegaly and LVAD without acute abnormality of the lungs in AP portable projection. Electronically  Signed   By: Lauralyn Primes M.D.   On: 04/23/2019 15:45       Assessment/Plan:    1. LVAD Complication, Driveline infection.   WBC trending down. COVID pending.  CT abdomen- Ill-defined infiltrative changes surrounding the line which enters the anterior upper abdominal wall most likely representing infection.  Blood Cultures- NGTD  Wound Culture- Rare gram + cocci -Continue vanc and cefepime.  -CT surgery notified.  2. 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 Volume status stable.   -Continue sildenafil for RV failure. No bb for that reason.  - Watch BP with infection. Stable today.   INR 1.5. Continue Heparin drip. Hold coumadin until treatment course defined.  LDH stable.   3. HTN Continue current meds.   4. Anemia Hgb 10.1  Check CBC daily   5. Hypomagnesemia Mag 1.6 on admit. Received 4  grams Mag.  - Check Mag in am.   Transfer to 2C.   I reviewed the LVAD parameters from today, and compared the results to the patient's prior recorded data.  No programming changes were made.  The LVAD is functioning within specified parameters.  The patient performs LVAD self-test daily.  LVAD interrogation was negative for any significant power changes, alarms or PI events/speed drops.  LVAD equipment check completed and is in good working order.  Back-up equipment present.   LVAD education done on emergency procedures and precautions and reviewed exit site care.  Length of Stay: 1  Amy Clegg, NP 04/24/2019, 7:31 AM  VAD Team Pager [phone] (7am - 7am) +++VAD ISSUES ONLY+++   Advanced Heart Failure Team Pager [phone] (M-F; 7a - 4p)  Please contact [facility] for night-coverage after hours (4p -7a ) and weekends on [email or web address] for all non- LVAD Issues  Patient seen and examined with Tonye Becket, NP. We discussed all aspects of the encounter. I agree with the assessment and plan as stated above.   CT scan of ab reviewed with Dr. Donata Clay. DL infection appears  superficial with diffuse cellulitis. No clear need for I&D at this time. Wound culture with rare SA. Continue cefipime and vanc for now. Cellulitis mildly improved.   Echo today with LVEF 10% good cannula placement mod RV dysfunction.   Off coumadin. Remains on heparin in case any possible surgery needed. No bleeding. Discussed dosing with PharmD personally.  Volume status stable. VAD interrogated personally. Parameters stable.  General:  NAD.  HEENT: normal  Neck: supple. JVP not elevated.  Carotids 2+ bilat; no bruits. No lymphadenopathy or thryomegaly appreciated. Cor: LVAD hum.  Lungs: Clear. Abdomen: obese soft, nontender, + distended. No hepatosplenomegaly. No bruits or masses. Good bowel sounds. Driveline site with  diffuse surrounding erythema (improving) No tunnel  Anchor in place.  Extremities: no cyanosis, clubbing, rash. Warm no edema  Neuro: alert & oriented x 3. No focal deficits. Moves all 4 without problem    Continue vanc & cefipime for DL infection.  If continues to improve can restart warfarin. Possibly tomorrow.   [name], MD  9:29 PM

## 2019-04-24 NOTE — Plan of Care (Signed)
  Problem: Education: Goal: Knowledge of General Education information will improve Description: Including pain rating scale, medication(s)/side effects and non-pharmacologic comfort measures Outcome: Progressing   Problem: Health Behavior/Discharge Planning: Goal: Ability to manage health-related needs will improve Outcome: Not Progressing   Problem: Clinical Measurements: Goal: Ability to maintain clinical measurements within normal limits will improve Outcome: Progressing Goal: Will remain free from infection Outcome: Not Progressing

## 2019-04-24 NOTE — Progress Notes (Signed)
ANTICOAGULATION CONSULT NOTE  Pharmacy Consult for Heparin Indication: LVAD  No Known Allergies  Patient Measurements: Height: 6\' 2"  (188 cm) Weight: 243 lb 8 oz (110.5 kg) IBW/kg (Calculated) : 82.2 Heparin Dosing Weight: 100kg  Vital Signs: Temp: 98.3 F (36.8 C) (12/08 0400) Temp Source: Oral (12/08 0400) BP: 111/97 (12/08 0000) Pulse Rate: 101 (12/08 0500)  Labs: Recent Labs    04/23/19 1350 04/23/19 2155 04/24/19 0356  HGB 10.4*  --  10.1*  HCT 34.5*  --  33.8*  PLT 157  --  169  LABPROT 19.1*  --  18.1*  INR 1.6*  --  1.5*  HEPARINUNFRC  --  <0.10* <0.10*  CREATININE 1.26*  --  1.17    Estimated Creatinine Clearance: 104.3 mL/min (by C-G formula based on SCr of 1.17 mg/dL).  Assessment: 48 yo Male with LVAD, Coumadin on hold and INR subtherapeutic, for heparin  12/8 AM update:  Heparin level remains low No issues per RN Hgb stable INR 1.5  Goal of Therapy:  Heparin level 0.3-0.5 units/ml Monitor platelets by anticoagulation protocol: Yes   Plan:  Inc heparin to 1500 units/hr 1400 heparin level  Narda Bonds, PharmD, BCPS Clinical Pharmacist Phone: [phone]

## 2019-04-24 NOTE — Progress Notes (Signed)
Procedure(s) (LRB): VAD DRIVELINE WOUND DEBRIDEMENT (N/A) APPLICATION OF WOUND VAC (N/A) Subjective: Patient examined, CT abdomen images reviewed Drive line tunnel with indurated tissue but no fluid collection to drain Exit site without space or tunneling, drainage minimal Will treat with extended iv antibiotics, does not meet criteria for surgery Objective: Vital signs in last 24 hours: Temp:  [97.6 F (36.4 C)-99.5 F (37.5 C)] 98.3 F (36.8 C) (12/08 0800) Pulse Rate:  [37-101] 95 (12/08 0700) Cardiac Rhythm: Normal sinus rhythm (12/08 0800) BP: (102-120)/(0-97) 111/97 (12/08 0800) SpO2:  [89 %-100 %] 95 % (12/08 0700) Weight:  [110.4 kg-110.5 kg] 110.5 kg (12/08 0500)  Hemodynamic parameters for last 24 hours:  stable  Intake/Output from previous day: 12/07 0701 - 12/08 0700 In: 116.9 [I.V.:116.9] Out: 400 [Urine:400] Intake/Output this shift: Total I/O In: 737.8 [I.V.:72.3; IV Piggyback:665.5] Out: -        Exam    General- alert and comfortable    Neck- no JVD, no cervical adenopathy palpable, no carotid bruit   Lungs- clear without rales, wheezes   Cor- regular rate and rhythm, no murmur ,normal VAD hum   Abdomen- some induration of abdominal wall w/o fluctuance, focal tenderness   Extremities - warm, non-tender, minimal edema   Neuro- oriented, appropriate, no focal weakness   Lab Results: Recent Labs    04/23/19 1350 04/24/19 0356  WBC 17.2* 15.3*  HGB 10.4* 10.1*  HCT 34.5* 33.8*  PLT 157 169   BMET:  Recent Labs    04/23/19 1350 04/24/19 0356  NA 133* 133*  K 3.5 3.9  CL 104 104  CO2 19* 19*  GLUCOSE 165* 134*  BUN 16 18  CREATININE 1.26* 1.17  CALCIUM 8.2* 8.3*    PT/INR:  Recent Labs    04/24/19 0356  LABPROT 18.1*  INR 1.5*   ABG    Component Value Date/Time   PHART 7.405 09/08/2017 0049   HCO3 23.4 09/08/2017 0049   TCO2 32 09/12/2017 1605   ACIDBASEDEF 1.0 09/08/2017 0049   O2SAT 60.9 09/19/2017 0445   CBG (last 3)   No results for input(s): GLUCAP in the last 72 hours.  Assessment/Plan: S/P Procedure(s) (LRB): VAD DRIVELINE WOUND DEBRIDEMENT (N/A) APPLICATION OF WOUND VAC (N/A) Cellulitis of drive line tunnel- pump pocket ok on CT Rec extended iv antibiotics, Swab of exit site with gram + cocci   LOS: 1 day    Tharon Aquas Trigt III 04/24/2019

## 2019-04-24 NOTE — Progress Notes (Signed)
  Echocardiogram 2D Echocardiogram has been performed.  Jennette Dubin 04/24/2019, 11:53 AM

## 2019-04-24 NOTE — Progress Notes (Signed)
LVAD Coordinator Rounding Note:  Admitted 04/23/19 by Dr. [name] due to VAD drive line infection.    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 Cyndia Bent under Destination Therapy criteria.  Pt lying in bed asleep; periods of apnea alternating with loud snoring, pt talking in his sleep. Pt woke, asked him if he wears CPAP at home and he responded "sometimes".   Vital signs: Temp: 98.3 HR: 98 Doppler Pressure: Automatic BP:  O2 Sat: 98% RA Wt: 243.8 lbs   LVAD interrogation reveals:  Speed: 5600 Flow: 5.0 Power:  4.2 PI: 2.5 Alarms: none Events: none Hematocrit: 38  Fixed speed: 5600 Low speed limit: 5300  Drive Line: dressing C/D/I; anchor intact and accurately placed. Daily dressing changes per bedside nurse with silver strips around exit sit. Next dressing change due today, 04/24/19.   Labs:  LDH trend: 284>282  INR trend: 1.6>1.5  Anticoagulation Plan: -INR Goal: 2.0 - 2.5  -ASA Dose: none  Plan/Recommendations:  1. Call VAD pager if any VAD equipment or drive line issues. 2. Bedside nurse to change dressing today.  Zada Girt RN, [place] Coordinator [phone]

## 2019-04-24 NOTE — Progress Notes (Signed)
ANTICOAGULATION CONSULT NOTE - Follow-Up Consult  Pharmacy Consult for heparin Indication: LVAD  No Known Allergies  Patient Measurements: Height: 6\' 2"  (188 cm) Weight: 243 lb 8 oz (110.5 kg) IBW/kg (Calculated) : 82.2 Heparin Dosing Weight: 100kg  Vital Signs: Temp: 98.3 F (36.8 C) (12/08 1432) Temp Source: Oral (12/08 1432) BP: 104/90 (12/08 1432) Pulse Rate: 93 (12/08 1432)  Labs: Recent Labs    04/23/19 1350 04/23/19 2155 04/24/19 0356 04/24/19 1438  HGB 10.4*  --  10.1*  --   HCT 34.5*  --  33.8*  --   PLT 157  --  169  --   LABPROT 19.1*  --  18.1*  --   INR 1.6*  --  1.5*  --   HEPARINUNFRC  --  <0.10* <0.10* 0.30  CREATININE 1.26*  --  1.17  --     Estimated Creatinine Clearance: 104.3 mL/min (by C-G formula based on SCr of 1.17 mg/dL).   Medical History: Past Medical History:  Diagnosis Date  . Asthma   . CHF (congestive heart failure) ([place])    a. 09/2016: EF 20-25% with cath showing normal cors  . GERD (gastroesophageal reflux disease)   . History of hiatal hernia   . OSA on CPAP 09/06/2018   Severe OSA with AHI 68/hr on CPAP at 12cm H2O    Assessment: 55 yoM with LVAD on warfarin PTA admitted with DLI. INR 1.5, pharmacy to hold warfarin and start IV heparin while awaiting CT.  Heparin level therapeutic at 0.30, CBC and LDH stable this morning.  Goal of Therapy:  Heparin level 0.3-0.5 units/ml Monitor platelets by anticoagulation protocol: Yes   Plan:  -Continue heparin 1500 units/h -Recheck heparin level in am   Arrie Senate, PharmD, BCPS Clinical Pharmacist [phone] Please check AMION for all [place] numbers 04/24/2019

## 2019-04-25 DIAGNOSIS — I5022 Chronic systolic (congestive) heart failure: Secondary | ICD-10-CM | POA: Diagnosis not present

## 2019-04-25 DIAGNOSIS — T827XXA Infection and inflammatory reaction due to other cardiac and vascular devices, implants and grafts, initial encounter: Secondary | ICD-10-CM | POA: Diagnosis not present

## 2019-04-25 DIAGNOSIS — T829XXA Unspecified complication of cardiac and vascular prosthetic device, implant and graft, initial encounter: Secondary | ICD-10-CM | POA: Diagnosis not present

## 2019-04-25 LAB — CBC
HCT: 32.9 % — ABNORMAL LOW (ref 39.0–52.0)
Hemoglobin: 9.9 g/dL — ABNORMAL LOW (ref 13.0–17.0)
MCH: 27.1 pg (ref 26.0–34.0)
MCHC: 30.1 g/dL (ref 30.0–36.0)
MCV: 90.1 fL (ref 80.0–100.0)
Platelets: 212 10*3/uL (ref 150–400)
RBC: 3.65 MIL/uL — ABNORMAL LOW (ref 4.22–5.81)
RDW: 21.4 % — ABNORMAL HIGH (ref 11.5–15.5)
WBC: 13.3 10*3/uL — ABNORMAL HIGH (ref 4.0–10.5)
nRBC: 0.2 % (ref 0.0–0.2)

## 2019-04-25 LAB — PROTIME-INR
INR: 1.4 — ABNORMAL HIGH (ref 0.8–1.2)
Prothrombin Time: 17.2 seconds — ABNORMAL HIGH (ref 11.4–15.2)

## 2019-04-25 LAB — BASIC METABOLIC PANEL
Anion gap: 9 (ref 5–15)
BUN: 20 mg/dL (ref 6–20)
CO2: 19 mmol/L — ABNORMAL LOW (ref 22–32)
Calcium: 8.5 mg/dL — ABNORMAL LOW (ref 8.9–10.3)
Chloride: 105 mmol/L (ref 98–111)
Creatinine, Ser: 1.38 mg/dL — ABNORMAL HIGH (ref 0.61–1.24)
GFR calc Af Amer: >60 mL/min (ref >60)
GFR calc non Af Amer: >60 mL/min (ref >60)
Glucose, Bld: 207 mg/dL — ABNORMAL HIGH (ref 70–99)
Potassium: 3.8 mmol/L (ref 3.5–5.1)
Sodium: 133 mmol/L — ABNORMAL LOW (ref 135–145)

## 2019-04-25 LAB — HEPARIN LEVEL (UNFRACTIONATED)
Heparin Unfractionated: 0.19 IU/mL — ABNORMAL LOW (ref 0.30–0.70)
Heparin Unfractionated: 0.48 IU/mL (ref 0.30–0.70)

## 2019-04-25 LAB — LACTATE DEHYDROGENASE: LDH: 248 U/L — ABNORMAL HIGH (ref 98–192)

## 2019-04-25 MED ORDER — POTASSIUM CHLORIDE CRYS ER 20 MEQ PO TBCR
40.0000 meq | EXTENDED_RELEASE_TABLET | Freq: Once | ORAL | Status: AC
  Administered 2019-04-25: 40 meq via ORAL
  Filled 2019-04-25: qty 2

## 2019-04-25 MED ORDER — WARFARIN SODIUM 7.5 MG PO TABS
7.5000 mg | ORAL_TABLET | Freq: Once | ORAL | Status: AC
  Administered 2019-04-25: 7.5 mg via ORAL
  Filled 2019-04-25: qty 1

## 2019-04-25 MED ORDER — DM-GUAIFENESIN ER 30-600 MG PO TB12
1.0000 | ORAL_TABLET | Freq: Two times a day (BID) | ORAL | Status: DC
  Administered 2019-04-25 – 2019-05-01 (×13): 1 via ORAL
  Filled 2019-04-25 (×13): qty 1

## 2019-04-25 MED ORDER — WARFARIN - PHARMACIST DOSING INPATIENT
Freq: Every day | Status: DC
  Administered 2019-04-25 – 2019-04-26 (×2): 1
  Administered 2019-04-27: 10
  Administered 2019-04-30: 1

## 2019-04-25 MED ORDER — FUROSEMIDE 10 MG/ML IJ SOLN
40.0000 mg | Freq: Once | INTRAMUSCULAR | Status: AC
  Administered 2019-04-25: 40 mg via INTRAVENOUS
  Filled 2019-04-25: qty 4

## 2019-04-25 MED ORDER — SODIUM CHLORIDE 0.9 % IV SOLN
INTRAVENOUS | Status: DC | PRN
  Administered 2019-04-25: 250 mL via INTRAVENOUS

## 2019-04-25 MED ORDER — HYDRALAZINE HCL 50 MG PO TABS
50.0000 mg | ORAL_TABLET | Freq: Three times a day (TID) | ORAL | Status: DC
  Administered 2019-04-25 – 2019-05-01 (×15): 50 mg via ORAL
  Filled 2019-04-25 (×17): qty 1

## 2019-04-25 MED ORDER — HYDROXYZINE HCL 25 MG PO TABS
25.0000 mg | ORAL_TABLET | Freq: Three times a day (TID) | ORAL | Status: DC | PRN
  Administered 2019-04-25 – 2019-04-30 (×11): 25 mg via ORAL
  Filled 2019-04-25 (×11): qty 1

## 2019-04-25 NOTE — Progress Notes (Addendum)
Rounding Note.   PCP-Cardiologist: Arvilla Meres, MD   Reason for Admission: LVAD complication. Driveline infection  HPI:    Admitted from VAD clinic with drive line infection. Started vanc and cefepime. WBC trending down 13.   Blood Cultures- NGTD  Wound Culture- Rare gram + cocci- Staph Aureus   Complaining of cough. SOB with exertion.   LVAD INTERROGATION:  HeartMate III LVAD:  Flow 4.7  liters/min, speed 5600, power 4 , PI 3.6     Allergies:  No Known Allergies  No current facility-administered medications on file prior to encounter.    Current Outpatient Medications on File Prior to Encounter  Medication Sig Dispense Refill  . amiodarone (PACERONE) 200 MG tablet Take 1 tablet (200 mg total) by mouth daily. 90 tablet 3  . citalopram (CELEXA) 10 MG tablet Take 1 tablet (10 mg total) by mouth daily. 90 tablet 5  . docusate sodium (COLACE) 100 MG capsule Take 1 capsule (100 mg total) by mouth 3 (three) times daily. 90 capsule 2  . doxycycline (ADOXA) 100 MG tablet Take 1 tablet (100 mg total) by mouth 2 (two) times daily for 14 days. 28 tablet 0  . ferrous sulfate 325 (65 FE) MG tablet Take 325 mg by mouth daily with breakfast.    . gabapentin (NEURONTIN) 300 MG capsule Take 1 capsule (300 mg total) by mouth 3 (three) times daily. 90 capsule 6  . hydrALAZINE (APRESOLINE) 25 MG tablet Take 1.5 tablets (37.5 mg total) by mouth every 8 (eight) hours. (Patient taking differently: Take 25-37.5 mg by mouth See admin instructions. Take 37.5mg  in the AM and PM and 25mg  Mid-day) 135 tablet 6  . hydrOXYzine (ATARAX/VISTARIL) 25 MG tablet Take 25 mg by mouth every 8 (eight) hours as needed for anxiety.    Marland Kitchen linaclotide (LINZESS) 145 MCG CAPS capsule Take 1 capsule (145 mcg total) by mouth daily before breakfast. (Patient taking differently: Take 145 mcg by mouth every other day. ) 30 capsule 6  . pantoprazole (PROTONIX) 40 MG tablet Take 1 tablet (40 mg total) by mouth 2 (two) times  daily. (Patient taking differently: Take 40 mg by mouth daily. ) 60 tablet 6  . sildenafil (REVATIO) 20 MG tablet Take 1 tablet (20 mg total) by mouth 3 (three) times daily. 90 tablet 5  . torsemide (DEMADEX) 20 MG tablet TAKE 1 TABLET BY MOUTH DAILY FOR 30 DAYS (Patient taking differently: Take 20 mg by mouth every other day. ) 30 tablet 3  . warfarin (COUMADIN) 5 MG tablet TAKE 1 AND 1/2 TABLETS BY MOUTH ON WEDNESDAY, AND FRIDAY AT 6:00 PM. TAKE 1 TABLET BY MOUTH ON SUNDAY, MONDAY, TUESDAY, THURSDAY, AND SATURDAY (Patient taking differently: Take 5-7.5 mg by mouth See admin instructions. On Mondays and Wednesdays take 7.5mg . Tuesdays, Thursdays, Fridays, Saturdays and Sundays take 5mg .) 180 tablet 3  . potassium chloride SA (KLOR-CON M20) 20 MEQ tablet Take 1 tablet (20 mEq total) by mouth daily. (Patient not taking: Reported on 04/24/2019) 90 tablet 3   . ceFEPime (MAXIPIME) IV 2 g (04/25/19 0032)  . heparin 1,500 Units/hr (04/25/19 0328)  . vancomycin 1,000 mg (04/25/19 0334)    Objective:    Vital Signs:   Temp:  [97.4 F (36.3 C)-98.4 F (36.9 C)] 97.9 F (36.6 C) (12/09 0750) Pulse Rate:  [92-95] 94 (12/09 0750) Resp:  [18-27] 27 (12/09 0750) BP: (76-116)/(56-97) 116/88 (12/09 0750) SpO2:  [99 %-100 %] 99 % (12/09 0750) Weight:  [110.5 kg-111.2 kg] 111.2 kg (  12/09 0416) Last BM Date: 04/25/19 Filed Weights   04/24/19 0500 04/24/19 1115 04/25/19 0416  Weight: 110.5 kg 110.5 kg 111.2 kg    Mean arterial Pressure 90-114  Physical Exam    Physical Exam: GENERAL: No acute distress. HEENT: normal  anicteric NECK: Supple, JVP 9~10   .  2+ bilaterally, no bruits.  No lymphadenopathy or thyromegaly appreciated.   CARDIAC:  Mechanical heart sounds with LVAD hum present.  LUNGS:  Clear to auscultation bilaterally.  ABDOMEN: obese + distended, round, tender, positive bowel sounds x4.   Erythema improved  LVAD exit site:  Dressing dry and intact.  Erythema improved Stabilization  device present and accurately applied.  Driveline dressing is being changed daily per sterile technique. No tunneling  Extremities: no cyanosis, clubbing, rash, edema Neuro: alert & oriented x 3, cranial nerves grossly intact. moves all 4 extremities w/o difficulty. Affect pleasant   Telemetry  Sinus Rhythm 90s personally reviewed.    EKG   N/A  Labs    Basic Metabolic Panel: Recent Labs  Lab 04/23/19 1350 04/24/19 0356 04/24/19 0743 04/25/19 0142  NA 133* 133*  --  133*  K 3.5 3.9  --  3.8  CL 104 104  --  105  CO2 19* 19*  --  19*  GLUCOSE 165* 134*  --  207*  BUN 16 18  --  20  CREATININE 1.26* 1.17  --  1.38*  CALCIUM 8.2* 8.3*  --  8.5*  MG 1.6*  --  2.4  --     Liver Function Tests: Recent Labs  Lab 04/23/19 1350  AST 29  ALT 21  ALKPHOS 89  BILITOT 2.5*  PROT 7.8  ALBUMIN 3.3*   No results for input(s): LIPASE, AMYLASE in the last 168 hours. No results for input(s): AMMONIA in the last 168 hours.  CBC: Recent Labs  Lab 04/23/19 1350 04/24/19 0356 04/25/19 0142  WBC 17.2* 15.3* 13.3*  HGB 10.4* 10.1* 9.9*  HCT 34.5* 33.8* 32.9*  MCV 88.9 89.2 90.1  PLT 157 169 212    Cardiac Enzymes: No results for input(s): CKTOTAL, CKMB, CKMBINDEX, TROPONINI in the last 168 hours.  BNP: BNP (last 3 results) Recent Labs    03/21/19 1415 04/23/19 1350  BNP 426.9* 709.6*    ProBNP (last 3 results) No results for input(s): PROBNP in the last 8760 hours.   CBG: No results for input(s): GLUCAP in the last 168 hours.  Coagulation Studies: Recent Labs    04/23/19 1350 04/24/19 0356 04/25/19 0142  LABPROT 19.1* 18.1* 17.2*  INR 1.6* 1.5* 1.4*      Imaging    No results found.     Assessment/Plan:    1. LVAD Complication, Driveline infection.   WBC trending down.  CT abdomen- Ill-defined infiltrative changes surrounding the line which enters the anterior upper abdominal wall most likely representing infection.  Blood Cultures- NGTD   Wound Culture- staph  aureus. -Responding to IV antibiotics. No plan for debridement. Continue vanc and cefepime. Plan to send home doxycyline 100 mg twice a day.  Considered keflex but I doubt he will take it 3 times a day.  -CT surgery appreciated.    2. 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 Volume status mildly elevated. Give 40 mg IV lasix now. + 40 meq potassium  -Continue sildenafil for RV failure. No bb for that reason.  - Watch BP with infection. Stable today.   INR 1.4.  Continue Heparin drip. Restart coumadin. No plan for debridement.  LDH stable.    3. HTN Continue current meds.   4. Anemia Hgb 9.9   5. Hypomagnesemia Mag 1.6 on admit. Received 4  grams Mag.  -Mag stable 2.4    I reviewed the LVAD parameters from today, and compared the results to the patient's prior recorded data.  No programming changes were made.  The LVAD is functioning within specified parameters.  The patient performs LVAD self-test daily.  LVAD interrogation was negative for any significant power changes, alarms or PI events/speed drops.  LVAD equipment check completed and is in good working order.  Back-up equipment present.   LVAD education done on emergency procedures and precautions and reviewed exit site care.  Length of Stay: 2  Darrick Grinder, NP 04/25/2019, 10:36 AM  VAD Team Pager [phone] (7am - 7am) +++VAD ISSUES ONLY+++   Advanced Heart Failure Team Pager [phone] (M-F; [place])  Please contact [facility] for night-coverage after hours (4p -7a ) and weekends on [email or web address] for all non- LVAD Issues  Patient seen and examined with the above-signed Advanced Practice Provider and/or Housestaff. I personally reviewed laboratory data, imaging studies and relevant notes. I independently examined the patient and formulated the important aspects of the plan. I have edited the note to reflect any of my changes or salient points. I have personally discussed the plan with  the patient and/or family.  On IV abx. Now AF. WBC coming down. Cellulitis on abdomen starting to improve. Wound CX with rare staph aureus.   He remains volume overloaded. Agree with IV lasix.   CT reviewed no abscess or ascites.   VAD interrogated personally. Parameters stable.  Plan: - continue IV abx. No need for surgical debridement at this time - restart warfarin. Continue heparin. - continue IV abx. - mobilize  [name], MD  12:08 PM

## 2019-04-25 NOTE — Progress Notes (Signed)
LVAD Coordinator Rounding Note:  Admitted 04/23/19 by Dr. [name] due to VAD drive line infection.    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 Cyndia Bent under Destination Therapy criteria.  Pt lying in bed asleep; awake when I entered room. Denies complaints.   His controller is loose in bed, reminded him to keep it attached at all times. He says he usually wears it in his concealed weapon shirt (which he is wearing). Provided him with battery holster and neck strap for securement of batteries and controller.    Vital signs: Temp: 98.2 HR: 99 Doppler Pressure: Automatic BP:  O2 Sat: 99% RA Wt: 243.8>245 lbs   LVAD interrogation reveals:  Speed: 5600 Flow: 5.1 Power:  4.3 PI: 2.6 Alarms: none Events: none Hematocrit: 38  Fixed speed: 5600 Low speed limit: 5300  Drive Line: Daily dressing changes per BS nurse using silver strip around exit site. Existing VAD dressing removed and site care performed using sterile technique. Drive line exit site cleaned with Chlora prep applicators x 2, allowed to dry, and gauze dressing with silver strip re-applied. Exit site healed and incorporated, the velour is fully implanted at exit site. Small amount brown drainage, with no redness, tenderness, foul odor or rash noted. Drive line anchor re-applied. Next dressing change due 04/25/19.   Labs:  LDH trend: 284>282  INR trend: 1.6>1.5  Anticoagulation Plan: -INR Goal: 2.0 - 2.5  -ASA Dose: none  Plan/Recommendations:  1. Call VAD pager if any VAD equipment or drive line issues. 2. Bedside nurse to change dressing today.  Zada Girt RN, [place] Coordinator [phone]

## 2019-04-25 NOTE — Progress Notes (Signed)
ANTICOAGULATION CONSULT NOTE - Follow-Up Consult  Pharmacy Consult for heparin Indication: LVAD  No Known Allergies  Patient Measurements: Height: 6\' 2"  (188 cm) Weight: 245 lb 3 oz (111.2 kg) IBW/kg (Calculated) : 82.2 Heparin Dosing Weight: 100kg  Vital Signs: Temp: 97.9 F (36.6 C) (12/09 0750) Temp Source: Oral (12/09 0750) BP: 116/88 (12/09 0750) Pulse Rate: 94 (12/09 0750)  Labs: Recent Labs    04/23/19 1350  04/24/19 0356 04/24/19 1438 04/25/19 0142  HGB 10.4*  --  10.1*  --  9.9*  HCT 34.5*  --  33.8*  --  32.9*  PLT 157  --  169  --  212  LABPROT 19.1*  --  18.1*  --  17.2*  INR 1.6*  --  1.5*  --  1.4*  HEPARINUNFRC  --    < > <0.10* 0.30 0.19*  CREATININE 1.26*  --  1.17  --  1.38*   < > = values in this interval not displayed.    Estimated Creatinine Clearance: 88.7 mL/min (A) (by C-G formula based on SCr of 1.38 mg/dL (H)).   Medical History: Past Medical History:  Diagnosis Date  . Asthma   . CHF (congestive heart failure) ([place])    a. 09/2016: EF 20-25% with cath showing normal cors  . GERD (gastroesophageal reflux disease)   . History of hiatal hernia   . OSA on CPAP 09/06/2018   Severe OSA with AHI 68/hr on CPAP at 12cm H2O    Assessment: 69 yoM with LVAD on warfarin PTA admitted with DLI. INR 1.5, pharmacy to hold warfarin and start IV heparin while awaiting CT.  Heparin level therapeutic at 0.48. INR 1.4, CBC stable. No plans for debridement so will begin warfarin tonight.  **PTA Warfarin dose = 7.5mg  MW, 5mg  AODs  Goal of Therapy:  Heparin level 0.3-0.5 units/ml Monitor platelets by anticoagulation protocol: Yes   Plan:  -Warfarin 7.5mg  PO x1 tonight -Continue heparin 1700 units/h -Daily INR, CBC, heparin level, LDH   Arrie Senate, PharmD, BCPS Clinical Pharmacist [phone] Please check AMION for all [place] numbers 04/25/2019

## 2019-04-25 NOTE — Progress Notes (Signed)
ANTICOAGULATION CONSULT NOTE  Pharmacy Consult for Heparin Indication: LVAD  No Known Allergies  Patient Measurements: Height: 6\' 2"  (188 cm) Weight: 245 lb 3 oz (111.2 kg) IBW/kg (Calculated) : 82.2 Heparin Dosing Weight: 100kg  Vital Signs: Temp: 97.4 F (36.3 C) (12/09 0314) Temp Source: Oral (12/09 0314) BP: 99/79 (12/09 0314) Pulse Rate: 92 (12/08 2204)  Labs: Recent Labs    04/23/19 1350  04/24/19 0356 04/24/19 1438 04/25/19 0142  HGB 10.4*  --  10.1*  --  9.9*  HCT 34.5*  --  33.8*  --  32.9*  PLT 157  --  169  --  212  LABPROT 19.1*  --  18.1*  --  17.2*  INR 1.6*  --  1.5*  --  1.4*  HEPARINUNFRC  --    < > <0.10* 0.30 0.19*  CREATININE 1.26*  --  1.17  --  1.38*   < > = values in this interval not displayed.    Estimated Creatinine Clearance: 88.7 mL/min (A) (by C-G formula based on SCr of 1.38 mg/dL (H)).  Assessment: 48 yo Male with LVAD, Coumadin on hold and INR subtherapeutic, for heparin  12/9 AM update:  Heparin level low No issues per RN Hgb stable INR 1.4  Goal of Therapy:  Heparin level 0.3-0.5 units/ml Monitor platelets by anticoagulation protocol: Yes   Plan:  Inc heparin to 1700 units/hr 1300 heparin level  Narda Bonds, PharmD, BCPS Clinical Pharmacist Phone: [phone]

## 2019-04-26 ENCOUNTER — Encounter (HOSPITAL_COMMUNITY)
Admission: AD | Disposition: A | Discharge status: Home / Self Care | Payer: Self-pay | Source: Ambulatory Visit | Attending: Internal Medicine

## 2019-04-26 DIAGNOSIS — T827XXA Infection and inflammatory reaction due to other cardiac and vascular devices, implants and grafts, initial encounter: Secondary | ICD-10-CM | POA: Diagnosis not present

## 2019-04-26 LAB — BASIC METABOLIC PANEL
Anion gap: 9 (ref 5–15)
BUN: 17 mg/dL (ref 6–20)
CO2: 19 mmol/L — ABNORMAL LOW (ref 22–32)
Calcium: 8.6 mg/dL — ABNORMAL LOW (ref 8.9–10.3)
Chloride: 106 mmol/L (ref 98–111)
Creatinine, Ser: 1.39 mg/dL — ABNORMAL HIGH (ref 0.61–1.24)
GFR calc Af Amer: >60 mL/min (ref >60)
GFR calc non Af Amer: >60 mL/min (ref >60)
Glucose, Bld: 220 mg/dL — ABNORMAL HIGH (ref 70–99)
Potassium: 4.2 mmol/L (ref 3.5–5.1)
Sodium: 134 mmol/L — ABNORMAL LOW (ref 135–145)

## 2019-04-26 LAB — HEPARIN LEVEL (UNFRACTIONATED)
Heparin Unfractionated: 0.16 IU/mL — ABNORMAL LOW (ref 0.30–0.70)
Heparin Unfractionated: 0.2 IU/mL — ABNORMAL LOW (ref 0.30–0.70)

## 2019-04-26 LAB — CBC
HCT: 32.7 % — ABNORMAL LOW (ref 39.0–52.0)
Hemoglobin: 9.7 g/dL — ABNORMAL LOW (ref 13.0–17.0)
MCH: 27.5 pg (ref 26.0–34.0)
MCHC: 29.7 g/dL — ABNORMAL LOW (ref 30.0–36.0)
MCV: 92.6 fL (ref 80.0–100.0)
Platelets: 224 10*3/uL (ref 150–400)
RBC: 3.53 MIL/uL — ABNORMAL LOW (ref 4.22–5.81)
RDW: 22.2 % — ABNORMAL HIGH (ref 11.5–15.5)
WBC: 12.4 10*3/uL — ABNORMAL HIGH (ref 4.0–10.5)
nRBC: 0.3 % — ABNORMAL HIGH (ref 0.0–0.2)

## 2019-04-26 LAB — PROTIME-INR
INR: 1.3 — ABNORMAL HIGH (ref 0.8–1.2)
Prothrombin Time: 15.6 seconds — ABNORMAL HIGH (ref 11.4–15.2)

## 2019-04-26 LAB — AEROBIC CULTURE W GRAM STAIN (SUPERFICIAL SPECIMEN)

## 2019-04-26 LAB — LACTATE DEHYDROGENASE: LDH: 242 U/L — ABNORMAL HIGH (ref 98–192)

## 2019-04-26 SURGERY — DEBRIDEMENT, WOUND, STERNUM
Anesthesia: General

## 2019-04-26 MED ORDER — WARFARIN SODIUM 7.5 MG PO TABS
7.5000 mg | ORAL_TABLET | Freq: Once | ORAL | Status: AC
  Administered 2019-04-26: 7.5 mg via ORAL
  Filled 2019-04-26: qty 1

## 2019-04-26 MED ORDER — MUPIROCIN 2 % EX OINT
1.0000 "application " | TOPICAL_OINTMENT | Freq: Two times a day (BID) | CUTANEOUS | Status: AC
  Administered 2019-04-26 – 2019-04-30 (×10): 1 via NASAL
  Filled 2019-04-26 (×2): qty 22

## 2019-04-26 MED ORDER — CEFAZOLIN SODIUM-DEXTROSE 2-4 GM/100ML-% IV SOLN
2.0000 g | Freq: Three times a day (TID) | INTRAVENOUS | Status: DC
  Administered 2019-04-26 – 2019-05-01 (×13): 2 g via INTRAVENOUS
  Filled 2019-04-26 (×17): qty 100

## 2019-04-26 MED ORDER — CHLORHEXIDINE GLUCONATE CLOTH 2 % EX PADS
6.0000 | MEDICATED_PAD | Freq: Every day | CUTANEOUS | Status: AC
  Administered 2019-04-28 – 2019-04-29 (×2): 6 via TOPICAL

## 2019-04-26 NOTE — Progress Notes (Addendum)
Rounding Note.   PCP-Cardiologist: Arvilla Meres, MD   Reason for Admission: LVAD complication. Driveline infection  HPI:     Admitted from VAD clinic with drive line infection. Started vanc and cefepime. WBC trending down 12. Afebrile.   Denies CP or SOB. MAPs up. Yesterday given dose IV lasix and hydralazine was increased.  Weight up but had increased urine output.   Wants to go home.    Blood Cultures- NGTD  Wound Culture-  Staph Aureus (MSSA)   LVAD INTERROGATION:  HeartMate III LVAD:  Flow 4.8  liters/min, speed 5600, power 4, PI 3.1    Allergies:  No Known Allergies  No current facility-administered medications on file prior to encounter.   Current Outpatient Medications on File Prior to Encounter  Medication Sig Dispense Refill   amiodarone (PACERONE) 200 MG tablet Take 1 tablet (200 mg total) by mouth daily. 90 tablet 3   citalopram (CELEXA) 10 MG tablet Take 1 tablet (10 mg total) by mouth daily. 90 tablet 5   docusate sodium (COLACE) 100 MG capsule Take 1 capsule (100 mg total) by mouth 3 (three) times daily. 90 capsule 2   doxycycline (ADOXA) 100 MG tablet Take 1 tablet (100 mg total) by mouth 2 (two) times daily for 14 days. 28 tablet 0   ferrous sulfate 325 (65 FE) MG tablet Take 325 mg by mouth daily with breakfast.     gabapentin (NEURONTIN) 300 MG capsule Take 1 capsule (300 mg total) by mouth 3 (three) times daily. 90 capsule 6   hydrALAZINE (APRESOLINE) 25 MG tablet Take 1.5 tablets (37.5 mg total) by mouth every 8 (eight) hours. (Patient taking differently: Take 25-37.5 mg by mouth See admin instructions. Take 37.5mg  in the AM and PM and 25mg  Mid-day) 135 tablet 6   hydrOXYzine (ATARAX/VISTARIL) 25 MG tablet Take 25 mg by mouth every 8 (eight) hours as needed for anxiety.     linaclotide (LINZESS) 145 MCG CAPS capsule Take 1 capsule (145 mcg total) by mouth daily before breakfast. (Patient taking differently: Take 145 mcg by mouth every other  day. ) 30 capsule 6   pantoprazole (PROTONIX) 40 MG tablet Take 1 tablet (40 mg total) by mouth 2 (two) times daily. (Patient taking differently: Take 40 mg by mouth daily. ) 60 tablet 6   sildenafil (REVATIO) 20 MG tablet Take 1 tablet (20 mg total) by mouth 3 (three) times daily. 90 tablet 5   torsemide (DEMADEX) 20 MG tablet TAKE 1 TABLET BY MOUTH DAILY FOR 30 DAYS (Patient taking differently: Take 20 mg by mouth every other day. ) 30 tablet 3   warfarin (COUMADIN) 5 MG tablet TAKE 1 AND 1/2 TABLETS BY MOUTH ON WEDNESDAY, AND FRIDAY AT 6:00 PM. TAKE 1 TABLET BY MOUTH ON SUNDAY, MONDAY, TUESDAY, THURSDAY, AND SATURDAY (Patient taking differently: Take 5-7.5 mg by mouth See admin instructions. On Mondays and Wednesdays take 7.5mg . Tuesdays, Thursdays, Fridays, Saturdays and Sundays take 5mg .) 180 tablet 3   potassium chloride SA (KLOR-CON M20) 20 MEQ tablet Take 1 tablet (20 mEq total) by mouth daily. (Patient not taking: Reported on 04/24/2019) 90 tablet 3    sodium chloride 250 mL (04/25/19 1325)   ceFEPime (MAXIPIME) IV 2 g (04/26/19 0601)   heparin 1,700 Units/hr (04/25/19 1825)   vancomycin 1,000 mg (04/26/19 0316)    Objective:    Vital Signs:   Temp:  [97.1 F (36.2 C)-98.2 F (36.8 C)] 97.2 F (36.2 C) (12/10 0307) Pulse Rate:  [80-99] 93 (  12/10 0307) Resp:  [20-28] 22 (12/09 1557) BP: (98-116)/(56-93) 98/58 (12/10 0307) SpO2:  [98 %-100 %] 100 % (12/10 0307) Weight:  [113.3 kg] 113.3 kg (12/10 0331) Last BM Date: 04/25/19 Filed Weights   04/24/19 1115 04/25/19 0416 04/26/19 0331  Weight: 110.5 kg 111.2 kg 113.3 kg    Mean arterial Pressure 100s f  Physical Exam     Physical Exam: General:  NAD.  HEENT: normal  Neck: supple. JVP not elevated.  Carotids 2+ bilat; no bruits. No lymphadenopathy or thryomegaly appreciated. Cor: LVAD hum.  Lungs: Clear. Abdomen: obese soft, nontender, + distended. No hepatosplenomegaly. No bruits or masses. Good bowel sounds.  Driveline site clean. Cellulitis completely resolved. No tunnelim Anchor in place.  Extremities: no cyanosis, clubbing, rash. Warm no edema  Neuro: alert & oriented x 3. No focal deficits. Moves all 4 without problem    Telemetry  Sinus Rhythm 90s personally reviewed.    EKG   N/A  Labs    Basic Metabolic Panel: Recent Labs  Lab 04/23/19 1350 04/24/19 0356 04/24/19 0743 04/25/19 0142 04/26/19 0220  NA 133* 133*  --  133* 134*  K 3.5 3.9  --  3.8 4.2  CL 104 104  --  105 106  CO2 19* 19*  --  19* 19*  GLUCOSE 165* 134*  --  207* 220*  BUN 16 18  --  20 17  CREATININE 1.26* 1.17  --  1.38* 1.39*  CALCIUM 8.2* 8.3*  --  8.5* 8.6*  MG 1.6*  --  2.4  --   --     Liver Function Tests: Recent Labs  Lab 04/23/19 1350  AST 29  ALT 21  ALKPHOS 89  BILITOT 2.5*  PROT 7.8  ALBUMIN 3.3*   No results for input(s): LIPASE, AMYLASE in the last 168 hours. No results for input(s): AMMONIA in the last 168 hours.  CBC: Recent Labs  Lab 04/23/19 1350 04/24/19 0356 04/25/19 0142 04/26/19 0220  WBC 17.2* 15.3* 13.3* 12.4*  HGB 10.4* 10.1* 9.9* 9.7*  HCT 34.5* 33.8* 32.9* 32.7*  MCV 88.9 89.2 90.1 92.6  PLT 157 169 212 224    Cardiac Enzymes: No results for input(s): CKTOTAL, CKMB, CKMBINDEX, TROPONINI in the last 168 hours.  BNP: BNP (last 3 results) Recent Labs    03/21/19 1415 04/23/19 1350  BNP 426.9* 709.6*    ProBNP (last 3 results) No results for input(s): PROBNP in the last 8760 hours.   CBG: No results for input(s): GLUCAP in the last 168 hours.  Coagulation Studies: Recent Labs    04/23/19 1350 04/24/19 0356 04/25/19 0142 04/26/19 0220  LABPROT 19.1* 18.1* 17.2* 15.6*  INR 1.6* 1.5* 1.4* 1.3*      Imaging    No results found.     Assessment/Plan:    1. LVAD Complication, Driveline infection.  - WBC continues to go down. Afebrile. Cellulitis resolved. - CT abdomen- Ill-defined infiltrative changes surrounding the line which  enters the anterior upper abdominal wall most likely representing infection.  - Blood Cultures- NGTD  - Wound Culture- staph  Aureus.(MSSA) -Responding to IV antibiotics. No plan for debridement. Cellulitis resolved. Switch to Ancef. Can use doxy on d/c.  -CT surgery appreciated.    2. 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  - Volume status improving. Stop IV lasix Restart torsemide 20 mg daily.  - Continue hydralazine 50 mg three times a day.  - We tried entresto  in the past but he developed AKI. -Continue sildenafil for RV failure. No bb or imdur for that reason.    - INR 1.3. Continue Heparin drip +  coumadin. Discussed dosing with PharmD personally. - VAD interrogated personally. Parameters stable.  3. HTN - MAPs improved after adjustments yesterday Continue current meds.   4. Anemia - Hgb 9.7 . No obvious source of bleeding.   5. Hypomagnesemia Mag 1.6 on admit. Received 4  grams Mag.  -Mag stable 2.4    I reviewed the LVAD parameters from today, and compared the results to the patient's prior recorded data.  No programming changes were made.  The LVAD is functioning within specified parameters.  The patient performs LVAD self-test daily.  LVAD interrogation was negative for any significant power changes, alarms or PI events/speed drops.  LVAD equipment check completed and is in good working order.  Back-up equipment present.   LVAD education done on emergency procedures and precautions and reviewed exit site care.  Length of Stay: 3  [name], MD  4:17 PM   04/26/2019, 7:02 AM  VAD Team Pager [phone] (7am - 7am) +++VAD ISSUES ONLY+++   Advanced Heart Failure Team Pager [phone] (M-F; [place])  Please contact [facility] for night-coverage after hours (4p -7a ) and weekends on [email or web address] for all non- LVAD Issues

## 2019-04-26 NOTE — Plan of Care (Signed)

## 2019-04-26 NOTE — Progress Notes (Signed)
LVAD Coordinator Rounding Note:  Admitted 04/23/19 by Dr. [name] due to VAD drive line infection.    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 Cyndia Bent under Destination Therapy criteria.  Pt lying in recliner. Is conversationally SOB, snoring while lying in recliner but pt is not asleep. Pt states he has CPAP at home but doesn't wear it because his mask is broke. Pt states that a new mask will cost him $200.  Vital signs: Temp: 97.8 HR: 95 Doppler Pressure: 98 Automatic BP: 115/79(90) O2 Sat: 100% RA Wt: 243.8>245>249.7 lbs   LVAD interrogation reveals:  Speed: 5600 Flow: 5.2 Power:  4.3 PI: 2.9 Alarms: none Events: 1 Hematocrit: 32  Fixed speed: 5600 Low speed limit: 5300  Drive Line: Daily dressing change by VAD coordinator today using silver strip around exit site. Existing VAD dressing removed and site care performed using sterile technique. Drive line exit site cleaned with Chlora prep applicators x 2, allowed to dry, and gauze dressing with silver strip re-applied. Exit site healed and incorporated, the velour is fully implanted at exit site. Small amount brown drainage, with no redness, tenderness, foul odor or rash noted. Drive line anchor re-applied. Next dressing change due 04/26/19.        Labs:  LDH trend: 284>282>242  INR trend: 1.6>1.5>1.3  Anticoagulation Plan: -INR Goal: 2.0 - 2.5  -ASA Dose: none -Heparin gtt 1800 u/hr  Adverse Events: - driveline cx 61/6/07>> staph aureus  Plan/Recommendations:  1. Call VAD pager if any VAD equipment or drive line issues.   [name] RN, [place] Coordinator [phone]

## 2019-04-26 NOTE — Progress Notes (Signed)
ANTICOAGULATION CONSULT NOTE - Follow Up Consult  Pharmacy Consult for heparin Indication: LVAD  Labs: Recent Labs    04/23/19 1350 04/24/19 0356 04/25/19 0142 04/25/19 1311 04/26/19 0220  HGB 10.4* 10.1* 9.9*  --  9.7*  HCT 34.5* 33.8* 32.9*  --  32.7*  PLT 157 169 212  --  224  LABPROT 19.1* 18.1* 17.2*  --   --   INR 1.6* 1.5* 1.4*  --   --   HEPARINUNFRC  --  <0.10* 0.19* 0.48 0.16*  CREATININE 1.26* 1.17 1.38*  --   --     Assessment/Plan:  Heparin level below goal this am after one level at goal though RN reports that pump has been reading "occluded" overnight; heparin gtt has been moved to a different line.  Will continue heparin gtt at current rate for now and recheck level.  Wynona Neat, PharmD, BCPS  04/26/2019,3:35 AM

## 2019-04-26 NOTE — Progress Notes (Signed)
Pharmacy Antibiotic Note  Jonathan Wood is a 48 y.o. 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 Pharmacy has been consulted for vancomycin and cefepime dosing. Pt never started PTA doxycycline but now has fevers.  Driveline culture growing S. Aureus, unclear if MSSA or MRSA at this point. Planning for doxycycline most likely at discharge. Cr is stable, WBC down, pt afebrile - will defer vancomycin levels for now.  Plan: Vancomycin 1000mg  IV q12h Cefepime 2g IV q8h Follow cultures, renal function  Height: 6\' 2"  (188 cm) Weight: 249 lb 12.5 oz (113.3 kg) IBW/kg (Calculated) : 82.2  Temp (24hrs), Avg:97.7 F (36.5 C), Min:97.1 F (36.2 C), Max:98.2 F (36.8 C)  Recent Labs  Lab 04/23/19 1350 04/24/19 0356 04/25/19 0142 04/26/19 0220  WBC 17.2* 15.3* 13.3* 12.4*  CREATININE 1.26* 1.17 1.38* 1.39*    Estimated Creatinine Clearance: 88.9 mL/min (A) (by C-G formula based on SCr of 1.39 mg/dL (H)).    No Known Allergies  Antimicrobials this admission: Vancomycin 12/7 >>  Cefepime 12/7 >>   Microbiology results: 12/7 BCx: NGTD 12/7 wound: rare s. aureus 12/8: MSSA PCR: positive  Thank you for allowing pharmacy to be a part of this patient's care.  Arrie Senate, PharmD, BCPS Clinical Pharmacist [phone] Please check AMION for all [place] numbers 04/26/2019

## 2019-04-26 NOTE — Progress Notes (Signed)
ANTICOAGULATION CONSULT NOTE - Follow-Up Consult  Pharmacy Consult for heparin Indication: LVAD  No Known Allergies  Patient Measurements: Height: 6\' 2"  (188 cm) Weight: 249 lb 12.5 oz (113.3 kg) IBW/kg (Calculated) : 82.2 Heparin Dosing Weight: 100kg  Vital Signs: Temp: 98 F (36.7 C) (12/10 0700) Temp Source: Oral (12/10 0700) BP: 85/68 (12/10 0700) Pulse Rate: 91 (12/10 0700)  Labs: Recent Labs    04/24/19 0356 04/25/19 0142 04/25/19 1311 04/26/19 0220 04/26/19 0802  HGB 10.1* 9.9*  --  9.7*  --   HCT 33.8* 32.9*  --  32.7*  --   PLT 169 212  --  224  --   LABPROT 18.1* 17.2*  --  15.6*  --   INR 1.5* 1.4*  --  1.3*  --   HEPARINUNFRC <0.10* 0.19* 0.48 0.16* 0.20*  CREATININE 1.17 1.38*  --  1.39*  --     Estimated Creatinine Clearance: 88.9 mL/min (A) (by C-G formula based on SCr of 1.39 mg/dL (H)).   Medical History: Past Medical History:  Diagnosis Date  . Asthma   . CHF (congestive heart failure) ([place])    a. 09/2016: EF 20-25% with cath showing normal cors  . GERD (gastroesophageal reflux disease)   . History of hiatal hernia   . OSA on CPAP 09/06/2018   Severe OSA with AHI 68/hr on CPAP at 12cm H2O    Assessment: 47 yoM with LVAD on warfarin PTA admitted with DLI. Warfarin initially held while deciding on I&D - none planned, warfarin resumed 12/9, heparin bridge continues.  INR subtherapeutic at 1.3 as expected, heparin level subtherapeutic at 0.20, CBC and LDH stable this morning.  **PTA Warfarin dose = 7.5mg  MW, 5mg  AODs  Goal of Therapy:  Heparin level 0.3-0.5 units/ml Monitor platelets by anticoagulation protocol: Yes   Plan:  -Warfarin 7.5mg  PO x1 tonight -Increase heparin to 1800 units/h -Daily INR, CBC, heparin level, LDH   Arrie Senate, PharmD, BCPS Clinical Pharmacist [phone] Please check AMION for all [place] numbers 04/26/2019

## 2019-04-27 ENCOUNTER — Encounter (HOSPITAL_COMMUNITY)
Admission: RE | Admit: 2019-04-27 | Payer: Medicaid Other | Source: Ambulatory Visit | Attending: Internal Medicine | Admitting: Internal Medicine

## 2019-04-27 ENCOUNTER — Inpatient Hospital Stay: Payer: Self-pay

## 2019-04-27 DIAGNOSIS — I5022 Chronic systolic (congestive) heart failure: Secondary | ICD-10-CM | POA: Diagnosis not present

## 2019-04-27 DIAGNOSIS — T827XXA Infection and inflammatory reaction due to other cardiac and vascular devices, implants and grafts, initial encounter: Secondary | ICD-10-CM | POA: Diagnosis not present

## 2019-04-27 DIAGNOSIS — T829XXA Unspecified complication of cardiac and vascular prosthetic device, implant and graft, initial encounter: Secondary | ICD-10-CM | POA: Diagnosis not present

## 2019-04-27 LAB — BASIC METABOLIC PANEL
Anion gap: 9 (ref 5–15)
BUN: 16 mg/dL (ref 6–20)
CO2: 19 mmol/L — ABNORMAL LOW (ref 22–32)
Calcium: 9 mg/dL (ref 8.9–10.3)
Chloride: 107 mmol/L (ref 98–111)
Creatinine, Ser: 1.43 mg/dL — ABNORMAL HIGH (ref 0.61–1.24)
GFR calc Af Amer: >60 mL/min (ref >60)
GFR calc non Af Amer: 58 mL/min — ABNORMAL LOW (ref >60)
Glucose, Bld: 106 mg/dL — ABNORMAL HIGH (ref 70–99)
Potassium: 4.1 mmol/L (ref 3.5–5.1)
Sodium: 135 mmol/L (ref 135–145)

## 2019-04-27 LAB — CBC
HCT: 32.6 % — ABNORMAL LOW (ref 39.0–52.0)
Hemoglobin: 9.8 g/dL — ABNORMAL LOW (ref 13.0–17.0)
MCH: 27.2 pg (ref 26.0–34.0)
MCHC: 30.1 g/dL (ref 30.0–36.0)
MCV: 90.6 fL (ref 80.0–100.0)
Platelets: 218 10*3/uL (ref 150–400)
RBC: 3.6 MIL/uL — ABNORMAL LOW (ref 4.22–5.81)
RDW: 22.4 % — ABNORMAL HIGH (ref 11.5–15.5)
WBC: 13.1 10*3/uL — ABNORMAL HIGH (ref 4.0–10.5)
nRBC: 0.2 % (ref 0.0–0.2)

## 2019-04-27 LAB — LACTATE DEHYDROGENASE: LDH: 232 U/L — ABNORMAL HIGH (ref 98–192)

## 2019-04-27 LAB — PROTIME-INR
INR: 1.2 (ref 0.8–1.2)
Prothrombin Time: 15.5 seconds — ABNORMAL HIGH (ref 11.4–15.2)

## 2019-04-27 LAB — HEPARIN LEVEL (UNFRACTIONATED): Heparin Unfractionated: 0.44 IU/mL (ref 0.30–0.70)

## 2019-04-27 MED ORDER — SODIUM CHLORIDE 0.9 % IV SOLN
510.0000 mg | Freq: Once | INTRAVENOUS | Status: AC
  Administered 2019-04-27: 510 mg via INTRAVENOUS
  Filled 2019-04-27: qty 17

## 2019-04-27 MED ORDER — WARFARIN SODIUM 10 MG PO TABS
10.0000 mg | ORAL_TABLET | Freq: Once | ORAL | Status: AC
  Administered 2019-04-27: 10 mg via ORAL
  Filled 2019-04-27: qty 1

## 2019-04-27 NOTE — Progress Notes (Signed)
ANTICOAGULATION CONSULT NOTE - Follow-Up Consult  Pharmacy Consult for heparin Indication: LVAD  No Known Allergies  Patient Measurements: Height: 6\' 2"  (188 cm) Weight: 246 lb 14.6 oz (112 kg) IBW/kg (Calculated) : 82.2 Heparin Dosing Weight: 100kg  Vital Signs: Temp: 97.5 F (36.4 C) (12/11 0749) Temp Source: Oral (12/11 0749) BP: 87/64 (12/11 0749) Pulse Rate: 86 (12/11 0749)  Labs: Recent Labs    04/25/19 0142 04/26/19 0220 04/26/19 0802 04/27/19 0241  HGB 9.9* 9.7*  --  9.8*  HCT 32.9* 32.7*  --  32.6*  PLT 212 224  --  218  LABPROT 17.2* 15.6*  --  15.5*  INR 1.4* 1.3*  --  1.2  HEPARINUNFRC 0.19* 0.16* 0.20* 0.44  CREATININE 1.38* 1.39*  --  1.43*    Estimated Creatinine Clearance: 85.9 mL/min (A) (by C-G formula based on SCr of 1.43 mg/dL (H)).   Medical History: Past Medical History:  Diagnosis Date  . Asthma   . CHF (congestive heart failure) ([place])    a. 09/2016: EF 20-25% with cath showing normal cors  . GERD (gastroesophageal reflux disease)   . History of hiatal hernia   . OSA on CPAP 09/06/2018   Severe OSA with AHI 68/hr on CPAP at 12cm H2O    Assessment: 61 yoM with LVAD on warfarin PTA admitted with DLI. Warfarin initially held while deciding on I&D - none planned, warfarin resumed 12/9, heparin bridge continues.  INR stagnant after boosted doses x2. CBC and LDH stable, heparin level therapeutic at 0.44.  **PTA Warfarin dose = 7.5mg  MW, 5mg  AODs  Goal of Therapy:  Heparin level 0.3-0.5 units/ml Monitor platelets by anticoagulation protocol: Yes   Plan:  -Warfarin 10mg  PO x1 tonight -Continue heparin 1800 units/h -Daily INR, CBC, heparin level, LDH   Arrie Senate, PharmD, BCPS Clinical Pharmacist [phone] Please check AMION for all [place] numbers 04/27/2019

## 2019-04-27 NOTE — TOC Progression Note (Signed)
Transition of Care [facility]) - Progression Note    Patient Details  Name: Jonathan Wood MRN: [id number] Date of Birth: [birth year]  [facility]) CM/SW Contact  [name], RN Phone Number: 04/27/2019, 9:29 AM  Clinical Narrative:    Hx of lvad, drive line infection, conts on hep drip, iv abx.  TOC team will cont to follow for dc needs.        Expected Discharge Plan and Services                                                 Social Determinants of Health (SDOH) Interventions    Readmission Risk Interventions No flowsheet data found.

## 2019-04-27 NOTE — Progress Notes (Addendum)
Rounding Note.   PCP-Cardiologist: Arvilla Meres, MD   Reason for Admission: LVAD complication. Driveline infection  HPI:     Admitted from VAD clinic with drive line infection w/ abdominal wall cellulitis. Started vanc and cefepime>>changed to Ancef. WBC initially trended down but back up, 12.4>>13.1. Afebrile. Denies fever and chills.   Blood Cultures- NGTD  Wound Culture-  Staph Aureus (MSSA)  No current cardiac symptoms. Denies dyspnea. Resting comfortably. On PO torsemide. -1.4 L out yesterday. Wt down 3 lb, 249>>246 lb. SCr up slightly from 1.39>>1.43.   LVAD INTERROGATION:  HeartMate III LVAD:  Flow 5.0  liters/min, speed 5600, power 4.1, PI 2.5. No PI events     Allergies:  No Known Allergies  No current facility-administered medications on file prior to encounter.   Current Outpatient Medications on File Prior to Encounter  Medication Sig Dispense Refill  . amiodarone (PACERONE) 200 MG tablet Take 1 tablet (200 mg total) by mouth daily. 90 tablet 3  . citalopram (CELEXA) 10 MG tablet Take 1 tablet (10 mg total) by mouth daily. 90 tablet 5  . docusate sodium (COLACE) 100 MG capsule Take 1 capsule (100 mg total) by mouth 3 (three) times daily. 90 capsule 2  . doxycycline (ADOXA) 100 MG tablet Take 1 tablet (100 mg total) by mouth 2 (two) times daily for 14 days. 28 tablet 0  . ferrous sulfate 325 (65 FE) MG tablet Take 325 mg by mouth daily with breakfast.    . gabapentin (NEURONTIN) 300 MG capsule Take 1 capsule (300 mg total) by mouth 3 (three) times daily. 90 capsule 6  . hydrALAZINE (APRESOLINE) 25 MG tablet Take 1.5 tablets (37.5 mg total) by mouth every 8 (eight) hours. (Patient taking differently: Take 25-37.5 mg by mouth See admin instructions. Take 37.5mg  in the AM and PM and 25mg  Mid-day) 135 tablet 6  . hydrOXYzine (ATARAX/VISTARIL) 25 MG tablet Take 25 mg by mouth every 8 (eight) hours as needed for anxiety.    linaclotide (LINZESS) 145 MCG CAPS capsule  Take 1 capsule (145 mcg total) by mouth daily before breakfast. (Patient taking differently: Take 145 mcg by mouth every other day. ) 30 capsule 6  . pantoprazole (PROTONIX) 40 MG tablet Take 1 tablet (40 mg total) by mouth 2 (two) times daily. (Patient taking differently: Take 40 mg by mouth daily. ) 60 tablet 6  . sildenafil (REVATIO) 20 MG tablet Take 1 tablet (20 mg total) by mouth 3 (three) times daily. 90 tablet 5  . torsemide (DEMADEX) 20 MG tablet TAKE 1 TABLET BY MOUTH DAILY FOR 30 DAYS (Patient taking differently: Take 20 mg by mouth every other day. ) 30 tablet 3  . warfarin (COUMADIN) 5 MG tablet TAKE 1 AND 1/2 TABLETS BY MOUTH ON WEDNESDAY, AND FRIDAY AT 6:00 PM. TAKE 1 TABLET BY MOUTH ON SUNDAY, MONDAY, TUESDAY, THURSDAY, AND SATURDAY (Patient taking differently: Take 5-7.5 mg by mouth See admin instructions. On Mondays and Wednesdays take 7.5mg . Tuesdays, Thursdays, Fridays, Saturdays and Sundays take 5mg .) 180 tablet 3  . potassium chloride SA (KLOR-CON M20) 20 MEQ tablet Take 1 tablet (20 mEq total) by mouth daily. (Patient not taking: Reported on 04/24/2019) 90 tablet 3   . sodium chloride 250 mL (04/25/19 1325)  .  ceFAZolin (ANCEF) IV 2 g (04/26/19 2229)  . heparin 1,800 Units/hr (04/27/19 0800)    Objective:    Vital Signs:   Temp:  [97.5 F (36.4 C)-98 F (36.7 C)] 97.5 F (36.4 C) (  12/11 0749) Pulse Rate:  [84-96] 86 (12/11 0749) Resp:  [18-25] 18 (12/11 0749) BP: (87-115)/(26-94) 87/64 (12/11 0749) SpO2:  [99 %-100 %] 100 % (12/11 0749) FiO2 (%):  [21 %] 21 % (12/10 2350) Weight:  [938 kg] 112 kg (12/11 0628) Last BM Date: 04/27/19 Filed Weights   04/25/19 0416 04/26/19 0331 04/27/19 1829  Weight: 111.2 kg 113.3 kg 112 kg    Mean arterial Pressure 100s f  Physical Exam    Physical Exam: General:  Young [name], obese abdomen. No respiratory difficulty HEENT: normal anicteric Neck: supple. no JVD. Carotids 2+ bilat; no bruits. No lymphadenopathy or thyromegaly  appreciated. Cor: LVAD Hum Lungs: clear no wheeze Driveline Site: site covered w/ clean dry bandage.  Abdomen: obese, distended. nontender No hepatosplenomegaly. No bruits or masses. Good bowel sounds. Extremities: no cyanosis, clubbing, rash, edema Neuro: alert & oriented x 3, cranial nerves grossly intact. moves all 4 extremities w/o difficulty. Affect pleasant     Telemetry   NSR 90s  personally reviewed.    EKG   N/A  Labs    Basic Metabolic Panel: Recent Labs  Lab 04/23/19 1350 04/24/19 0356 04/24/19 0743 04/25/19 0142 04/26/19 0220 04/27/19 0241  NA 133* 133*  --  133* 134* 135  K 3.5 3.9  --  3.8 4.2 4.1  CL 104 104  --  105 106 107  CO2 19* 19*  --  19* 19* 19*  GLUCOSE 165* 134*  --  207* 220* 106*  BUN 16 18  --  20 17 16   CREATININE 1.26* 1.17  --  1.38* 1.39* 1.43*  CALCIUM 8.2* 8.3*  --  8.5* 8.6* 9.0  MG 1.6*  --  2.4  --   --   --     Liver Function Tests: Recent Labs  Lab 04/23/19 1350  AST 29  ALT 21  ALKPHOS 89  BILITOT 2.5*  PROT 7.8  ALBUMIN 3.3*   No results for input(s): LIPASE, AMYLASE in the last 168 hours. No results for input(s): AMMONIA in the last 168 hours.  CBC: Recent Labs  Lab 04/23/19 1350 04/24/19 0356 04/25/19 0142 04/26/19 0220 04/27/19 0241  WBC 17.2* 15.3* 13.3* 12.4* 13.1*  HGB 10.4* 10.1* 9.9* 9.7* 9.8*  HCT 34.5* 33.8* 32.9* 32.7* 32.6*  MCV 88.9 89.2 90.1 92.6 90.6  PLT 157 169 212 224 218    Cardiac Enzymes: No results for input(s): CKTOTAL, CKMB, CKMBINDEX, TROPONINI in the last 168 hours.  BNP: BNP (last 3 results) Recent Labs    03/21/19 1415 04/23/19 1350  BNP 426.9* 709.6*    ProBNP (last 3 results) No results for input(s): PROBNP in the last 8760 hours.   CBG: No results for input(s): GLUCAP in the last 168 hours.  Coagulation Studies: Recent Labs    04/25/19 0142 04/26/19 0220 04/27/19 0241  LABPROT 17.2* 15.6* 15.5*  INR 1.4* 1.3* 1.2      Imaging    No results  found.     Assessment/Plan:    1. LVAD Complication, Driveline infection.  - WBC up slightly from 12>>13. Afebrile. Abdominal wall cellulitis. - CT abdomen- Ill-defined infiltrative changes surrounding the line which enters the anterior upper abdominal wall most likely representing infection.  - Blood Cultures- NGTD  - Wound Culture- staph  Aureus.(MSSA) -  IV antibiotics per Dr. Prescott Gum. Plan PICC line for continuation of abx x 2 weeks - CT surgery appreciated.   - LDH 232. INR 1.2. On coumadin + heparin  2. 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  - Volume status improving. Continue torsemide 20 mg daily.  - Continue hydralazine 50 mg three times a day.  - We tried entresto in the past but he developed AKI. -Continue sildenafil for RV failure. No bb or imdur for that reason.    - INR 1.2. Continue Heparin drip +  coumadin. Discussed dosing with PharmD personally. - VAD interrogated personally. Parameters stable.  3. HTN - MAPs improved, in the 70s. Continue current meds.   4. Anemia - Hgb stable at 9.8 . No obvious source of bleeding.   5. Hypomagnesemia Mag 1.6 on admit. Received 4  grams Mag.  -Mag stable 2.4 3 days ago -Repeat Mg level on am labs tomorrow  Length of Stay: 4  Brittainy Simmons, PA-C  10:15 AM   04/27/2019, 10:15 AM  VAD Team Pager [phone] (7am - 7am) +++VAD ISSUES ONLY+++   Advanced Heart Failure Team Pager [phone] (M-F; 7a - 4p)  Please contact [facility] for night-coverage after hours (4p -7a ) and weekends on [email or web address] for all non- LVAD Issues  Patient seen and examined with the above-signed Advanced Practice Provider and/or Housestaff. I personally reviewed laboratory data, imaging studies and relevant notes. I independently examined the patient and formulated the important aspects of the plan. I have edited the note to reflect any of my changes or salient points. I have personally discussed the plan with  the patient and/or family.  He remains on IV abx for MSSA superficial driveline infection. No f/c. Site improved. I d/w Dr. Donata Clay and recommends 2 weeks of IV abx so will need PICC line. D/w PICC team, they would like to place on Monday.   Remains on heparin/warfarin INR 1.2. No bleeding. Discussed dosing with PharmD personally.  Supping Mg. MAPs stable. VAD interrogated personally. Parameters stable.  Discussed need to watch his diet and lose weight.   Arvilla Meres, MD  2:24 PM

## 2019-04-27 NOTE — Progress Notes (Signed)
Procedure(s) (LRB): VAD DRIVELINE WOUND DEBRIDEMENT (N/A) APPLICATION OF WOUND VAC (N/A) Subjective: Day 4 of IV antibiotics for abdominal wall cellulitis localized to the power cord tunnel.  On exam the induration tenderness and erythema have significantly improved with IV antibiotics.  There is no tunneling at the site of the power cord exit. CT scan showed no loculated fluid collections or abscess. Patient is afebrile with normal white count. The patient should ideally receive 10 to 14 days of IV antibiotics which hopefully could be administered through a PICC line as outpatient.  Objective: Vital signs in last 24 hours: Temp:  [97.5 F (36.4 C)-98 F (36.7 C)] 97.5 F (36.4 C) (12/11 0749) Pulse Rate:  [84-96] 86 (12/11 0749) Cardiac Rhythm: Normal sinus rhythm (12/10 1900) Resp:  [18-25] 24 (12/10 2350) BP: (87-115)/(26-94) 87/64 (12/11 0749) SpO2:  [99 %-100 %] 100 % (12/11 0749) FiO2 (%):  [21 %] 21 % (12/10 2350) Weight:  [497 kg] 112 kg (12/11 0628)  Hemodynamic parameters for last 24 hours:    Intake/Output from previous day: 12/10 0701 - 12/11 0700 In: 1746.6 [P.O.:700; I.V.:575.8; IV Piggyback:470.7] Out: 275 [Urine:275] Intake/Output this shift: No intake/output data recorded.  Alert and comfortable Minimal induration of the abdominal wall Power cord dressing clean and dry Normal VAD hum  Lab Results: Recent Labs    04/26/19 0220 04/27/19 0241  WBC 12.4* 13.1*  HGB 9.7* 9.8*  HCT 32.7* 32.6*  PLT 224 218   BMET:  Recent Labs    04/26/19 0220 04/27/19 0241  NA 134* 135  K 4.2 4.1  CL 106 107  CO2 19* 19*  GLUCOSE 220* 106*  BUN 17 16  CREATININE 1.39* 1.43*  CALCIUM 8.6* 9.0    PT/INR:  Recent Labs    04/27/19 0241  LABPROT 15.5*  INR 1.2   ABG    Component Value Date/Time   PHART 7.405 09/08/2017 0049   HCO3 23.4 09/08/2017 0049   TCO2 32 09/12/2017 1605   ACIDBASEDEF 1.0 09/08/2017 0049   O2SAT 60.9 09/19/2017 0445   CBG  (last 3)  No results for input(s): GLUCAP in the last 72 hours.  Assessment/Plan: S/P Procedure(s) (LRB): VAD DRIVELINE WOUND DEBRIDEMENT (N/A) APPLICATION OF WOUND VAC (N/A) It would be optimal to give the patient 2 weeks of IV antibiotics for his abdominal wall cellulitis-consider options including PICC line which may have challenges for this individual.   LOS: 4 days    Jonathan Wood 04/27/2019

## 2019-04-27 NOTE — Plan of Care (Signed)
  Problem: Education: Goal: Knowledge of the prescribed therapeutic regimen will improve Outcome: Progressing   Problem: Activity: Goal: Risk for activity intolerance will decrease Outcome: Not Progressing    Pt refusing bath/hygiene, RN encouraged. Will attempt bath again this afternoon. Pt educated on both hygiene and infection risks. Pt refused mobility in hall. Pt ambulates independently to bathroom, but declined x3 this AM with nursing staff. Will continue to encourage.

## 2019-04-27 NOTE — Progress Notes (Signed)
PHARMACY CONSULT NOTE FOR:  OUTPATIENT  PARENTERAL ANTIBIOTIC THERAPY (OPAT)  Indication: LVAD DLI Regimen: Cefazolin 2g IV q8h End date: 05/07/19  IV antibiotic discharge orders are pended. To discharging provider:  please sign these orders via discharge navigator,  Select New Orders & click on the button choice - Manage This Unsigned Work.     Thank you for allowing pharmacy to be a part of this patient's care.  Arrie Senate, PharmD, BCPS Clinical Pharmacist [phone] Please check AMION for all [place] numbers 04/27/2019

## 2019-04-27 NOTE — Progress Notes (Signed)
LVAD Coordinator Rounding Note:  Admitted 04/23/19 by Dr. [name] due to VAD drive line infection.    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 Cyndia Bent under Destination Therapy criteria.  Pt wore CPAP all night, states that he rested well using the machine. Pt is upset because he will need to stay in the hospital until Monday per Dr. Prescott Gum. Pt will require home IV antibiotics for 2 weeks and placement of picc line.  I discussed with the pt today the importance of keeping himself clean and his clothes to protect his driveline and his newly placed picc line.   Vital signs: Temp: 97.8 HR: 90 Doppler Pressure: 86 Automatic BP: 97/69(80) O2 Sat: 100% RA Wt: 243.8>245>249.7>246.9 lbs   LVAD interrogation reveals:  Speed: 5600 Flow: 5 Power:  4.2 PI: 2.8 Alarms: none Events: none Hematocrit: 32  Fixed speed: 5600 Low speed limit: 5300  Drive Line: Daily dressing change by VAD coordinator today using silver strip around exit site. Existing VAD dressing removed and site care performed using sterile technique. Drive line exit site cleaned with Chlora prep applicators x 2, allowed to dry, and gauze dressing with silver strip re-applied. Exit site healed and incorporated, the velour is fully implanted at exit site. Small amount brown drainage, with no redness, tenderness, foul odor or rash noted. Drive line anchor re-applied. Next dressing change due 04/28/19.          Labs:  LDH trend: 284>282>242>232  INR trend: 1.6>1.5>1.3>1.2  Anticoagulation Plan: -INR Goal: 2.0 - 2.5  -ASA Dose: none -Heparin gtt 1800 u/hr  Adverse Events: - driveline cx 83/4/19>> staph aureus  Plan/Recommendations:  1. Call VAD pager if any VAD equipment or drive line issues.  [name] RN, [place] Coordinator [phone]

## 2019-04-27 NOTE — Progress Notes (Addendum)
Spoke with Aileen Pilot RN re PICC order.  Pt to be d/c on Cefazolin per pharmacy notes. Securechat sent to Dr [name].  Awaiting response.

## 2019-04-27 NOTE — Progress Notes (Signed)
Reviewed order for PICC.  Secure chat Dr [name] PICC vs midline for patient..  Dr [name] states will review.  Will hold off placing until decision made.

## 2019-04-28 LAB — CULTURE, BLOOD (ROUTINE X 2)
Culture: NO GROWTH
Culture: NO GROWTH
Special Requests: ADEQUATE
Special Requests: ADEQUATE

## 2019-04-28 LAB — LACTATE DEHYDROGENASE: LDH: 231 U/L — ABNORMAL HIGH (ref 98–192)

## 2019-04-28 LAB — BASIC METABOLIC PANEL WITH GFR
Anion gap: 11 (ref 5–15)
BUN: 17 mg/dL (ref 6–20)
CO2: 22 mmol/L (ref 22–32)
Calcium: 9.2 mg/dL (ref 8.9–10.3)
Chloride: 104 mmol/L (ref 98–111)
Creatinine, Ser: 1.49 mg/dL — ABNORMAL HIGH (ref 0.61–1.24)
GFR calc Af Amer: >60 mL/min
GFR calc non Af Amer: 55 mL/min — ABNORMAL LOW
Glucose, Bld: 121 mg/dL — ABNORMAL HIGH (ref 70–99)
Potassium: 4.2 mmol/L (ref 3.5–5.1)
Sodium: 137 mmol/L (ref 135–145)

## 2019-04-28 LAB — HEPARIN LEVEL (UNFRACTIONATED)
Heparin Unfractionated: 0.79 IU/mL — ABNORMAL HIGH (ref 0.30–0.70)
Heparin Unfractionated: 0.66 IU/mL (ref 0.30–0.70)
Heparin Unfractionated: 0.61 IU/mL (ref 0.30–0.70)

## 2019-04-28 LAB — CBC
HCT: 32.9 % — ABNORMAL LOW (ref 39.0–52.0)
Hemoglobin: 10 g/dL — ABNORMAL LOW (ref 13.0–17.0)
MCH: 27.5 pg (ref 26.0–34.0)
MCHC: 30.4 g/dL (ref 30.0–36.0)
MCV: 90.4 fL (ref 80.0–100.0)
Platelets: 222 10*3/uL (ref 150–400)
RBC: 3.64 MIL/uL — ABNORMAL LOW (ref 4.22–5.81)
RDW: 22.6 % — ABNORMAL HIGH (ref 11.5–15.5)
WBC: 12.3 10*3/uL — ABNORMAL HIGH (ref 4.0–10.5)
nRBC: 0 % (ref 0.0–0.2)

## 2019-04-28 LAB — PROTIME-INR
INR: 1.2 (ref 0.8–1.2)
Prothrombin Time: 15.2 s (ref 11.4–15.2)

## 2019-04-28 LAB — MAGNESIUM: Magnesium: 1.8 mg/dL (ref 1.7–2.4)

## 2019-04-28 MED ORDER — WARFARIN SODIUM 10 MG PO TABS
10.0000 mg | ORAL_TABLET | Freq: Once | ORAL | Status: AC
  Administered 2019-04-28: 10 mg via ORAL
  Filled 2019-04-28: qty 1

## 2019-04-28 MED ORDER — MAGNESIUM SULFATE 2 GM/50ML IV SOLN
2.0000 g | Freq: Once | INTRAVENOUS | Status: AC
  Administered 2019-04-28: 2 g via INTRAVENOUS
  Filled 2019-04-28: qty 50

## 2019-04-28 NOTE — Plan of Care (Signed)
  Problem: Clinical Measurements: Goal: Will remain free from infection Outcome: Progressing   Problem: Nutrition: Goal: Adequate nutrition will be maintained Outcome: Progressing   Problem: Elimination: Goal: Will not experience complications related to bowel motility Outcome: Progressing   

## 2019-04-28 NOTE — Progress Notes (Signed)
Spoke with Devona Konig RN re PICC placement.  Per Dr Aundra Dubin progress note, plan to place PICC Monday prior to discharge.  RN states pt currently has 2 PIV working well.

## 2019-04-28 NOTE — Progress Notes (Signed)
ANTICOAGULATION CONSULT NOTE - Follow Up Consult  Pharmacy Consult for heparin Indication: LVAD  Labs: Recent Labs    04/26/19 0220 04/27/19 0241 04/28/19 0716 04/28/19 1514 04/28/19 2240  HGB 9.7* 9.8* 10.0*  --   --   HCT 32.7* 32.6* 32.9*  --   --   PLT 224 218 222  --   --   LABPROT 15.6* 15.5* 15.2  --   --   INR 1.3* 1.2 1.2  --   --   HEPARINUNFRC 0.16* 0.44 0.79* 0.66 0.61  CREATININE 1.39* 1.43* 1.49*  --   --     Assessment: 48yo male remains supratherapeutic on heparin after rate decrease.  Goal of Therapy:  Heparin level 0.3-0.5 units/ml   Plan:  Will decrease heparin gtt by 2 units/kg/hr to 1400 units/hr and check level in 6 hours.    Wynona Neat, PharmD, BCPS  04/28/2019,11:40 PM

## 2019-04-28 NOTE — Progress Notes (Signed)
ANTICOAGULATION CONSULT NOTE - Follow-Up Consult  Pharmacy Consult for heparin Indication: LVAD  No Known Allergies  Patient Measurements: Height: 6\' 2"  (188 cm) Weight: 242 lb 11.6 oz (110.1 kg) IBW/kg (Calculated) : 82.2 Heparin Dosing Weight: 100kg  Vital Signs: Temp: 97.6 F (36.4 C) (12/12 0828) Temp Source: Oral (12/12 0828) BP: 96/79 (12/12 0828) Pulse Rate: 85 (12/12 0358)  Labs: Recent Labs    04/26/19 0220 04/26/19 0802 04/27/19 0241 04/28/19 0716  HGB 9.7*  --  9.8* 10.0*  HCT 32.7*  --  32.6* 32.9*  PLT 224  --  218 222  LABPROT 15.6*  --  15.5* 15.2  INR 1.3*  --  1.2 1.2  HEPARINUNFRC 0.16* 0.20* 0.44 0.79*  CREATININE 1.39*  --  1.43*  --     Estimated Creatinine Clearance: 85.3 mL/min (A) (by C-G formula based on SCr of 1.43 mg/dL (H)).   Medical History: Past Medical History:  Diagnosis Date  . Asthma   . CHF (congestive heart failure) ([place])    a. 09/2016: EF 20-25% with cath showing normal cors  . GERD (gastroesophageal reflux disease)   . History of hiatal hernia   . OSA on CPAP 09/06/2018   Severe OSA with AHI 68/hr on CPAP at 12cm H2O    Assessment: 52 yoM with LVAD on warfarin PTA admitted with DLI. Warfarin initially held while deciding on I&D - none planned, warfarin resumed 12/9, heparin bridge continues.  INR stagnant after boosted doses x3. CBC stable, LDH still pending Heparin level elevated this AM at 0.79 - confirmed collected correctly with RN  **PTA Warfarin dose = 7.5mg  MW, 5mg  AODs  Goal of Therapy:  Heparin level 0.3-0.5 units/ml Monitor platelets by anticoagulation protocol: Yes   Plan:  -Warfarin 10mg  PO x1 tonight -Decrease heparin to 1700 units/h -Check 6 hour heparin level -Daily INR, CBC, heparin level, LDH  Vertis Kelch, PharmD, [facility] PGY2 Cardiology Pharmacy Resident Phone [phone] 04/28/2019       9:07 AM  Please check [email or web address] for unit-specific pharmacist phone numbers

## 2019-04-28 NOTE — Progress Notes (Addendum)
Patient ID: Jonathan Wood, male   DOB: 05/13/1971, 48 y.o.   MRN: [id number]     Rounding Note.   PCP-Cardiologist: Arvilla Meres, MD   Reason for Admission: LVAD complication. Driveline infection  HPI:     Admitted from VAD clinic with drive line infection w/ abdominal wall cellulitis. Started vanc and cefepime>>changed to Ancef. WBC down to 12. Afebrile. Denies fever and chills.   Blood Cultures- NGTD  Wound Culture-  Staph Aureus (MSSA)  No current cardiac symptoms. Denies dyspnea. Resting comfortably. On PO torsemide.  Creatinine fairly stable at 1.49.   Remains on heparin gtt + warfarin, INR 1.2.   LVAD INTERROGATION:  HeartMate III LVAD:  Flow 5.2  liters/min, speed 5600, power 4.2, PI 3.3. No PI events     Allergies:  No Known Allergies  No current facility-administered medications on file prior to encounter.   Current Outpatient Medications on File Prior to Encounter  Medication Sig Dispense Refill  . amiodarone (PACERONE) 200 MG tablet Take 1 tablet (200 mg total) by mouth daily. 90 tablet 3  . citalopram (CELEXA) 10 MG tablet Take 1 tablet (10 mg total) by mouth daily. 90 tablet 5  . docusate sodium (COLACE) 100 MG capsule Take 1 capsule (100 mg total) by mouth 3 (three) times daily. 90 capsule 2  . doxycycline (ADOXA) 100 MG tablet Take 1 tablet (100 mg total) by mouth 2 (two) times daily for 14 days. 28 tablet 0  . ferrous sulfate 325 (65 FE) MG tablet Take 325 mg by mouth daily with breakfast.    . gabapentin (NEURONTIN) 300 MG capsule Take 1 capsule (300 mg total) by mouth 3 (three) times daily. 90 capsule 6  . hydrALAZINE (APRESOLINE) 25 MG tablet Take 1.5 tablets (37.5 mg total) by mouth every 8 (eight) hours. (Patient taking differently: Take 25-37.5 mg by mouth See admin instructions. Take 37.5mg  in the AM and PM and 25mg  Mid-day) 135 tablet 6  . hydrOXYzine (ATARAX/VISTARIL) 25 MG tablet Take 25 mg by mouth every 8 (eight) hours as needed for anxiety.      linaclotide (LINZESS) 145 MCG CAPS capsule Take 1 capsule (145 mcg total) by mouth daily before breakfast. (Patient taking differently: Take 145 mcg by mouth every other day. ) 30 capsule 6  . pantoprazole (PROTONIX) 40 MG tablet Take 1 tablet (40 mg total) by mouth 2 (two) times daily. (Patient taking differently: Take 40 mg by mouth daily. ) 60 tablet 6  . sildenafil (REVATIO) 20 MG tablet Take 1 tablet (20 mg total) by mouth 3 (three) times daily. 90 tablet 5  . torsemide (DEMADEX) 20 MG tablet TAKE 1 TABLET BY MOUTH DAILY FOR 30 DAYS (Patient taking differently: Take 20 mg by mouth every other day. ) 30 tablet 3  . warfarin (COUMADIN) 5 MG tablet TAKE 1 AND 1/2 TABLETS BY MOUTH ON WEDNESDAY, AND FRIDAY AT 6:00 PM. TAKE 1 TABLET BY MOUTH ON SUNDAY, MONDAY, TUESDAY, THURSDAY, AND SATURDAY (Patient taking differently: Take 5-7.5 mg by mouth See admin instructions. On Mondays and Wednesdays take 7.5mg . Tuesdays, Thursdays, Fridays, Saturdays and Sundays take 5mg .) 180 tablet 3  . potassium chloride SA (KLOR-CON M20) 20 MEQ tablet Take 1 tablet (20 mEq total) by mouth daily. (Patient not taking: Reported on 04/24/2019) 90 tablet 3   . sodium chloride 250 mL (04/25/19 1325)  .  ceFAZolin (ANCEF) IV 2 g (04/28/19 0505)  . heparin 1,700 Units/hr (04/28/19 0933)  . magnesium sulfate bolus IVPB  Objective:    Vital Signs:   Temp:  [97.5 F (36.4 C)-98.3 F (36.8 C)] 97.6 F (36.4 C) (12/12 0828) Pulse Rate:  [84-95] 95 (12/12 0900) Resp:  [16-26] 20 (12/12 0900) BP: (94-108)/(72-88) 96/79 (12/12 0828) SpO2:  [97 %-100 %] 100 % (12/12 0900) FiO2 (%):  [21 %] 21 % (12/12 0042) Weight:  [110.1 kg] 110.1 kg (12/12 0657) Last BM Date: 04/28/19 Filed Weights   04/26/19 0331 04/27/19 0628 04/28/19 0657  Weight: 113.3 kg 112 kg 110.1 kg    Mean arterial Pressure 80s  Physical Exam    General: Well appearing this am. NAD.  HEENT: Normal. Neck: Supple, JVP 7-8 cm. Carotids OK.  Cardiac:   Mechanical heart sounds with LVAD hum present.  Lungs:  CTAB, normal effort.  Abdomen:  NT, ND, no HSM. No bruits or masses. +BS  LVAD exit site: Well-healed and incorporated. Dressing dry and intact. No erythema or drainage. Stabilization device present and accurately applied. Driveline dressing changed daily per sterile technique. Extremities:  Warm and dry. No cyanosis, clubbing, rash.  Trace ankle edema.  Neuro:  Alert & oriented x 3. Cranial nerves grossly intact. Moves all 4 extremities w/o difficulty. Affect pleasant     Telemetry   NSR 90s  personally reviewed.    EKG   N/A  Labs    Basic Metabolic Panel: Recent Labs  Lab 04/23/19 1350 04/24/19 0356 04/24/19 0743 04/25/19 0142 04/26/19 0220 04/27/19 0241 04/28/19 0716  NA 133* 133*  --  133* 134* 135 137  K 3.5 3.9  --  3.8 4.2 4.1 4.2  CL 104 104  --  105 106 107 104  CO2 19* 19*  --  19* 19* 19* 22  GLUCOSE 165* 134*  --  207* 220* 106* 121*  BUN 16 18  --  20 17 16 17   CREATININE 1.26* 1.17  --  1.38* 1.39* 1.43* 1.49*  CALCIUM 8.2* 8.3*  --  8.5* 8.6* 9.0 9.2  MG 1.6*  --  2.4  --   --   --  1.8    Liver Function Tests: Recent Labs  Lab 04/23/19 1350  AST 29  ALT 21  ALKPHOS 89  BILITOT 2.5*  PROT 7.8  ALBUMIN 3.3*   No results for input(s): LIPASE, AMYLASE in the last 168 hours. No results for input(s): AMMONIA in the last 168 hours.  CBC: Recent Labs  Lab 04/24/19 0356 04/25/19 0142 04/26/19 0220 04/27/19 0241 04/28/19 0716  WBC 15.3* 13.3* 12.4* 13.1* 12.3*  HGB 10.1* 9.9* 9.7* 9.8* 10.0*  HCT 33.8* 32.9* 32.7* 32.6* 32.9*  MCV 89.2 90.1 92.6 90.6 90.4  PLT 169 212 224 218 222    Cardiac Enzymes: No results for input(s): CKTOTAL, CKMB, CKMBINDEX, TROPONINI in the last 168 hours.  BNP: BNP (last 3 results) Recent Labs    03/21/19 1415 04/23/19 1350  BNP 426.9* 709.6*    ProBNP (last 3 results) No results for input(s): PROBNP in the last 8760 hours.   CBG: No results  for input(s): GLUCAP in the last 168 hours.  Coagulation Studies: Recent Labs    04/26/19 0220 04/27/19 0241 04/28/19 0716  LABPROT 15.6* 15.5* 15.2  INR 1.3* 1.2 1.2      Imaging    No results found.     Assessment/Plan:    1. LVAD Complication, Driveline infection.  - WBC up slightly from 12>>13. Afebrile. Abdominal wall cellulitis. - CT abdomen- Ill-defined infiltrative changes surrounding the line which  enters the anterior upper abdominal wall most likely representing infection.  - Blood Cultures- NGTD  - Wound Culture- staph  Aureus.(MSSA) -  IV antibiotics per Dr. Donata [place]. Plan PICC line for continuation of abx x 2 weeks.  Plan for PICC on Monday.  - CT surgery appreciated.   - LDH 231. INR 1.2. On coumadin + heparin gtt overlap until INR > 1.7.    2. 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  - Volume status improving. Continue torsemide 20 mg daily.  - Continue hydralazine 50 mg three times a day.  - We tried entresto in the past but he developed AKI. -Continue sildenafil for RV failure. No bb or imdur for that reason.    - INR 1.2. Continue Heparin drip +  coumadin. Discussed dosing with PharmD personally. - VAD interrogated personally. Parameters stable.  3. HTN - MAP stable in 80s. Continue current meds.   4. Anemia - Hgb stable at 10. No obvious source of bleeding.   5. Hypomagnesemia - Replace today.   Length of Stay: 5  Marca Ancona, MD  11:36 AM   04/28/2019, 11:36 AM  VAD Team Pager [phone] (7am - 7am) +++VAD ISSUES ONLY+++   Advanced Heart Failure Team Pager [phone] (M-F; 7a - 4p)  Please contact [facility] for night-coverage after hours (4p -7a ) and weekends on [email or web address] for all non- LVAD Issues

## 2019-04-28 NOTE — Progress Notes (Signed)
Spoke with and secure chat message sent to [facility] RN re PICC line vs midline for home antibiotics.  Recommend placement of midline for cefazolin infusion at home.  Also recommend placement on day of discharge. RN to address with MD today.

## 2019-04-28 NOTE — Progress Notes (Addendum)
Talked Dr. Aundra Dubin regarding inserting PICC. IV nurse called and recommended to put in the Mid line rather than PICC line. Dr. Aundra Dubin still wanted to put in PICC line. Notified IV team that patient still need PICC.  Patient refused to take Torsemide due to taking Linzess. Linzess make diarrhea, so torsemide and linzess both medications are too much for him. HS [employer]

## 2019-04-28 NOTE — Progress Notes (Signed)
Pt able to place self on CPAP dream station without assistance. Pt setting is auto titrate max 16 min 8 with no oxygen bled into the system. RT will continue to monitor.

## 2019-04-28 NOTE — Progress Notes (Signed)
Pt able to place self on CPAP dream station without assistance. Pt on auto titrate settings of max 16 min 8 with no oxygen bled into the system. RT will continue to monitor.

## 2019-04-28 NOTE — Progress Notes (Signed)
With much encouragment, patient ambulated 31ft throughout unit. Some shortness of breath noted while ambulating. Encouraged to ambulate more ofter and to take frequent breaks when needed.

## 2019-04-28 NOTE — Progress Notes (Signed)
ANTICOAGULATION CONSULT NOTE  Pharmacy Consult:  Heparin Indication: LVAD  No Known Allergies  Patient Measurements: Height: 6\' 2"  (188 cm) Weight: 242 lb 11.6 oz (110.1 kg) IBW/kg (Calculated) : 82.2 Heparin Dosing Weight: 100kg  Vital Signs: Temp: 97.9 F (36.6 C) (12/12 1157) Temp Source: Oral (12/12 1157) BP: 90/75 (12/12 1157) Pulse Rate: 93 (12/12 1157)  Labs: Recent Labs    04/26/19 0220 04/27/19 0241 04/28/19 0716 04/28/19 1514  HGB 9.7* 9.8* 10.0*  --   HCT 32.7* 32.6* 32.9*  --   PLT 224 218 222  --   LABPROT 15.6* 15.5* 15.2  --   INR 1.3* 1.2 1.2  --   HEPARINUNFRC 0.16* 0.44 0.79* 0.66  CREATININE 1.39* 1.43* 1.49*  --     Estimated Creatinine Clearance: 81.8 mL/min (A) (by C-G formula based on SCr of 1.49 mg/dL (H)).   Assessment: 27 yoM with LVAD on warfarin PTA admitted with DLI. Warfarin initially held while deciding on I&D - none planned, warfarin resumed 12/9, heparin bridge continues.  Heparin level is trending down but remains supra-therapeutic.  Lab drawn appropriately; no bleeding per RN.  Goal of Therapy:  Heparin level 0.3-0.5 units/ml Monitor platelets by anticoagulation protocol: Yes   Plan:  Reduce heparin gtt to 1600 units/hr Check 6 hr heparin level  Kye Hedden D. Mina Marble, PharmD, BCPS, [place] 04/28/2019, 3:47 PM

## 2019-04-29 LAB — BASIC METABOLIC PANEL WITH GFR
Anion gap: 11 (ref 5–15)
BUN: 16 mg/dL (ref 6–20)
CO2: 21 mmol/L — ABNORMAL LOW (ref 22–32)
Calcium: 9.2 mg/dL (ref 8.9–10.3)
Chloride: 104 mmol/L (ref 98–111)
Creatinine, Ser: 1.46 mg/dL — ABNORMAL HIGH (ref 0.61–1.24)
GFR calc Af Amer: >60 mL/min (ref >60)
GFR calc non Af Amer: 57 mL/min — ABNORMAL LOW (ref >60)
Glucose, Bld: 188 mg/dL — ABNORMAL HIGH (ref 70–99)
Potassium: 4.3 mmol/L (ref 3.5–5.1)
Sodium: 136 mmol/L (ref 135–145)

## 2019-04-29 LAB — CBC
HCT: 33.4 % — ABNORMAL LOW (ref 39.0–52.0)
Hemoglobin: 9.9 g/dL — ABNORMAL LOW (ref 13.0–17.0)
MCH: 26.9 pg (ref 26.0–34.0)
MCHC: 29.6 g/dL — ABNORMAL LOW (ref 30.0–36.0)
MCV: 90.8 fL (ref 80.0–100.0)
Platelets: 229 K/uL (ref 150–400)
RBC: 3.68 MIL/uL — ABNORMAL LOW (ref 4.22–5.81)
RDW: 22.5 % — ABNORMAL HIGH (ref 11.5–15.5)
WBC: 11.7 K/uL — ABNORMAL HIGH (ref 4.0–10.5)
nRBC: 0.2 % (ref 0.0–0.2)

## 2019-04-29 LAB — HEPARIN LEVEL (UNFRACTIONATED): Heparin Unfractionated: 0.45 IU/mL (ref 0.30–0.70)

## 2019-04-29 LAB — PROTIME-INR
INR: 1.4 — ABNORMAL HIGH (ref 0.8–1.2)
Prothrombin Time: 16.8 s — ABNORMAL HIGH (ref 11.4–15.2)

## 2019-04-29 LAB — LACTATE DEHYDROGENASE: LDH: 234 U/L — ABNORMAL HIGH (ref 98–192)

## 2019-04-29 MED ORDER — WARFARIN SODIUM 10 MG PO TABS
10.0000 mg | ORAL_TABLET | Freq: Once | ORAL | Status: AC
  Administered 2019-04-29: 10 mg via ORAL
  Filled 2019-04-29: qty 1

## 2019-04-29 NOTE — Plan of Care (Signed)

## 2019-04-29 NOTE — Progress Notes (Signed)
Patient ID: Jonathan Wood, male   DOB: 09-Oct-1970, 48 y.o.   MRN: [id number]     Rounding Note.   PCP-Cardiologist: Arvilla Meres, MD   Reason for Admission: LVAD complication. Driveline infection  HPI:     Admitted from VAD clinic with drive line infection w/ abdominal wall cellulitis. Started vanc and cefepime>>changed to Ancef. WBC down to 12. Afebrile. Denies fever and chills.   Blood Cultures- NGTD  Wound Culture-  Staph Aureus (MSSA)  No current cardiac symptoms. Denies dyspnea. Resting comfortably, anxious to get home. On PO torsemide.  Creatinine fairly stable at 1.46.   Remains on heparin gtt + warfarin, INR 1.4.   LVAD INTERROGATION:  HeartMate III LVAD:  Flow 5  liters/min, speed 5600, power 4.2, PI 3.6. No PI events     Allergies:  No Known Allergies  No current facility-administered medications on file prior to encounter.   Current Outpatient Medications on File Prior to Encounter  Medication Sig Dispense Refill  . amiodarone (PACERONE) 200 MG tablet Take 1 tablet (200 mg total) by mouth daily. 90 tablet 3  . citalopram (CELEXA) 10 MG tablet Take 1 tablet (10 mg total) by mouth daily. 90 tablet 5  . docusate sodium (COLACE) 100 MG capsule Take 1 capsule (100 mg total) by mouth 3 (three) times daily. 90 capsule 2  . doxycycline (ADOXA) 100 MG tablet Take 1 tablet (100 mg total) by mouth 2 (two) times daily for 14 days. 28 tablet 0  . ferrous sulfate 325 (65 FE) MG tablet Take 325 mg by mouth daily with breakfast.    . gabapentin (NEURONTIN) 300 MG capsule Take 1 capsule (300 mg total) by mouth 3 (three) times daily. 90 capsule 6  . hydrALAZINE (APRESOLINE) 25 MG tablet Take 1.5 tablets (37.5 mg total) by mouth every 8 (eight) hours. (Patient taking differently: Take 25-37.5 mg by mouth See admin instructions. Take 37.5mg  in the AM and PM and 25mg  Mid-day) 135 tablet 6  . hydrOXYzine (ATARAX/VISTARIL) 25 MG tablet Take 25 mg by mouth every 8 (eight) hours as  needed for anxiety.    linaclotide (LINZESS) 145 MCG CAPS capsule Take 1 capsule (145 mcg total) by mouth daily before breakfast. (Patient taking differently: Take 145 mcg by mouth every other day. ) 30 capsule 6  . pantoprazole (PROTONIX) 40 MG tablet Take 1 tablet (40 mg total) by mouth 2 (two) times daily. (Patient taking differently: Take 40 mg by mouth daily. ) 60 tablet 6  . sildenafil (REVATIO) 20 MG tablet Take 1 tablet (20 mg total) by mouth 3 (three) times daily. 90 tablet 5  . torsemide (DEMADEX) 20 MG tablet TAKE 1 TABLET BY MOUTH DAILY FOR 30 DAYS (Patient taking differently: Take 20 mg by mouth every other day. ) 30 tablet 3  . warfarin (COUMADIN) 5 MG tablet TAKE 1 AND 1/2 TABLETS BY MOUTH ON WEDNESDAY, AND FRIDAY AT 6:00 PM. TAKE 1 TABLET BY MOUTH ON SUNDAY, MONDAY, TUESDAY, THURSDAY, AND SATURDAY (Patient taking differently: Take 5-7.5 mg by mouth See admin instructions. On Mondays and Wednesdays take 7.5mg . Tuesdays, Thursdays, Fridays, Saturdays and Sundays take 5mg .) 180 tablet 3  . potassium chloride SA (KLOR-CON M20) 20 MEQ tablet Take 1 tablet (20 mEq total) by mouth daily. (Patient not taking: Reported on 04/24/2019) 90 tablet 3   . sodium chloride 250 mL (04/25/19 1325)  .  ceFAZolin (ANCEF) IV 2 g (04/29/19 14/09/20)  . heparin 1,400 Units/hr (04/29/19 0800)  Objective:    Vital Signs:   Temp:  [97.3 F (36.3 C)-98.4 F (36.9 C)] 97.3 F (36.3 C) (12/13 1133) Pulse Rate:  [84-93] 92 (12/13 1133) Resp:  [17-24] 20 (12/13 1133) BP: (79-114)/(57-78) 106/57 (12/13 1133) SpO2:  [93 %-99 %] 99 % (12/13 1133) Weight:  [109.8 kg] 109.8 kg (12/13 0309) Last BM Date: 04/28/19 Filed Weights   04/27/19 0628 04/28/19 0657 04/29/19 0309  Weight: 112 kg 110.1 kg 109.8 kg    Mean arterial Pressure 80s  Physical Exam    General: Well appearing this am. NAD.  HEENT: Normal. Neck: Supple, JVP 7-8 cm. Carotids OK.  Cardiac:  Mechanical heart sounds with LVAD hum present.   Lungs:  CTAB, normal effort.  Abdomen:  NT, mild distention, no HSM. No bruits or masses. +BS  LVAD exit site: Well-healed and incorporated. Dressing dry and intact. No erythema or drainage. Stabilization device present and accurately applied. Driveline dressing changed daily per sterile technique. Extremities:  Warm and dry. No cyanosis, clubbing, rash, or edema.  Neuro:  Alert & oriented x 3. Cranial nerves grossly intact. Moves all 4 extremities w/o difficulty. Affect pleasant     Telemetry   NSR 90s  personally reviewed.    EKG   N/A  Labs    Basic Metabolic Panel: Recent Labs  Lab 04/23/19 1350 04/24/19 0743 04/25/19 0142 04/26/19 0220 04/27/19 0241 04/28/19 0716 04/29/19 0019  NA 133*  --  133* 134* 135 137 136  K 3.5  --  3.8 4.2 4.1 4.2 4.3  CL 104  --  105 106 107 104 104  CO2 19*  --  19* 19* 19* 22 21*  GLUCOSE 165*  --  207* 220* 106* 121* 188*  BUN 16  --  20 17 16 17 16   CREATININE 1.26*  --  1.38* 1.39* 1.43* 1.49* 1.46*  CALCIUM 8.2*  --  8.5* 8.6* 9.0 9.2 9.2  MG 1.6* 2.4  --   --   --  1.8  --     Liver Function Tests: Recent Labs  Lab 04/23/19 1350  AST 29  ALT 21  ALKPHOS 89  BILITOT 2.5*  PROT 7.8  ALBUMIN 3.3*   No results for input(s): LIPASE, AMYLASE in the last 168 hours. No results for input(s): AMMONIA in the last 168 hours.  CBC: Recent Labs  Lab 04/25/19 0142 04/26/19 0220 04/27/19 0241 04/28/19 0716 04/29/19 0019  WBC 13.3* 12.4* 13.1* 12.3* 11.7*  HGB 9.9* 9.7* 9.8* 10.0* 9.9*  HCT 32.9* 32.7* 32.6* 32.9* 33.4*  MCV 90.1 92.6 90.6 90.4 90.8  PLT 212 224 218 222 229    Cardiac Enzymes: No results for input(s): CKTOTAL, CKMB, CKMBINDEX, TROPONINI in the last 168 hours.  BNP: BNP (last 3 results) Recent Labs    03/21/19 1415 04/23/19 1350  BNP 426.9* 709.6*    ProBNP (last 3 results) No results for input(s): PROBNP in the last 8760 hours.   CBG: No results for input(s): GLUCAP in the last 168 hours.   Coagulation Studies: Recent Labs    04/27/19 0241 04/28/19 0716 04/29/19 0548  LABPROT 15.5* 15.2 16.8*  INR 1.2 1.2 1.4*      Imaging    No results found.     Assessment/Plan:    1. LVAD Complication, Driveline infection.  - WBC up slightly from 12>>13. Afebrile. Abdominal wall cellulitis. - CT abdomen- Ill-defined infiltrative changes surrounding the line which enters the anterior upper abdominal wall most likely representing infection.  -  Blood Cultures- NGTD  - Wound Culture- staph  Aureus.(MSSA) -  IV antibiotics per Dr. Prescott Gum. Plan PICC line for continuation of abx x 2 weeks.  Plan for PICC on Monday.  - CT surgery appreciated.   - LDH 234. INR 1.4. On coumadin + heparin gtt overlap until INR > 1.7.    2. 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  - Volume status improving. Continue torsemide 20 mg daily.  - Continue hydralazine 50 mg three times a day.  - We tried entresto in the past but he developed AKI. -Continue sildenafil for RV failure. No bb or imdur for that reason.    - INR 1.4. Continue Heparin drip +  coumadin. Discussed dosing with PharmD personally. - VAD interrogated personally. Parameters stable.  3. HTN - MAP stable in 80s. Continue current meds.   4. Anemia - Hgb stable at 9.9. No obvious source of bleeding.   Length of Stay: 6  Loralie Champagne, MD  11:34 AM   04/29/2019, 11:34 AM  VAD Team Pager [phone] (7am - 7am) +++VAD ISSUES ONLY+++   Advanced Heart Failure Team Pager [phone] (M-F; 7a - 4p)  Please contact [facility] for night-coverage after hours (4p -7a ) and weekends on [email or web address] for all non- LVAD Issues

## 2019-04-29 NOTE — Progress Notes (Signed)
Pt states he can place himself on CPAP. Filled water chamber with water. Instructed to call if he needed any assistance.

## 2019-04-29 NOTE — Progress Notes (Addendum)
ANTICOAGULATION CONSULT NOTE - Follow-Up Consult  Pharmacy Consult for heparin + warfarin Indication: LVAD  No Known Allergies  Patient Measurements: Height: 6\' 2"  (188 cm) Weight: 242 lb 1 oz (109.8 kg) IBW/kg (Calculated) : 82.2 Heparin Dosing Weight: 100kg  Vital Signs: Temp: 98.1 F (36.7 C) (12/13 0731) Temp Source: Oral (12/13 0731) BP: 79/62 (12/13 0731) Pulse Rate: 93 (12/13 0731)  Labs: Recent Labs    04/27/19 0241 04/28/19 0716 04/28/19 1514 04/28/19 2240 04/29/19 0019 04/29/19 0548  HGB 9.8* 10.0*  --   --  9.9*  --   HCT 32.6* 32.9*  --   --  33.4*  --   PLT 218 222  --   --  229  --   LABPROT 15.5* 15.2  --   --   --  16.8*  INR 1.2 1.2  --   --   --  1.4*  HEPARINUNFRC 0.44 0.79* 0.66 0.61  --  0.45  CREATININE 1.43* 1.49*  --   --  1.46*  --     Estimated Creatinine Clearance: 83.3 mL/min (A) (by C-G formula based on SCr of 1.46 mg/dL (H)).   Medical History: Past Medical History:  Diagnosis Date  . Asthma   . CHF (congestive heart failure) ([place])    a. 09/2016: EF 20-25% with cath showing normal cors  . GERD (gastroesophageal reflux disease)   . History of hiatal hernia   . OSA on CPAP 09/06/2018   Severe OSA with AHI 68/hr on CPAP at 12cm H2O    Assessment: 41 yoM with LVAD on warfarin PTA admitted with DLI. Warfarin initially held while deciding on I&D - none planned, warfarin resumed 12/9, heparin bridge continues.  INR today is up to 1.4. CBC and LDH stable Heparin level elevated this AM is therapeutic at 0.45 Noted plan for PICC on Monday  **PTA Warfarin dose = 7.5mg  MW, 5mg  AODs  Goal of Therapy:  Heparin level 0.3-0.5 units/ml Monitor platelets by anticoagulation protocol: Yes   Plan:  -Warfarin 10mg  PO x1 tonight -Continue heparin at 1400 units/h -Stop heparin drip once INR > 1.7 -Daily INR, CBC, heparin level, LDH  Vertis Kelch, PharmD, [facility] PGY2 Cardiology Pharmacy Resident Phone [phone] 04/29/2019       7:37  AM  Please check [email or web address] for unit-specific pharmacist phone numbers

## 2019-04-29 NOTE — Plan of Care (Signed)

## 2019-04-30 LAB — BASIC METABOLIC PANEL
Anion gap: 9 (ref 5–15)
BUN: 18 mg/dL (ref 6–20)
CO2: 23 mmol/L (ref 22–32)
Calcium: 9.1 mg/dL (ref 8.9–10.3)
Chloride: 102 mmol/L (ref 98–111)
Creatinine, Ser: 1.38 mg/dL — ABNORMAL HIGH (ref 0.61–1.24)
GFR calc Af Amer: >60 mL/min (ref >60)
GFR calc non Af Amer: >60 mL/min (ref >60)
Glucose, Bld: 125 mg/dL — ABNORMAL HIGH (ref 70–99)
Potassium: 4.3 mmol/L (ref 3.5–5.1)
Sodium: 134 mmol/L — ABNORMAL LOW (ref 135–145)

## 2019-04-30 LAB — HEPARIN LEVEL (UNFRACTIONATED): Heparin Unfractionated: 0.32 IU/mL (ref 0.30–0.70)

## 2019-04-30 LAB — PROTIME-INR
INR: 1.4 — ABNORMAL HIGH (ref 0.8–1.2)
Prothrombin Time: 17.1 seconds — ABNORMAL HIGH (ref 11.4–15.2)

## 2019-04-30 LAB — LACTATE DEHYDROGENASE: LDH: 208 U/L — ABNORMAL HIGH (ref 98–192)

## 2019-04-30 MED ORDER — WARFARIN SODIUM 2.5 MG PO TABS
12.5000 mg | ORAL_TABLET | Freq: Once | ORAL | Status: AC
  Administered 2019-04-30: 12.5 mg via ORAL
  Filled 2019-04-30: qty 1

## 2019-04-30 MED ORDER — SODIUM CHLORIDE 0.9% FLUSH: 10.0000 mL | INTRAVENOUS | Status: DC | PRN

## 2019-04-30 NOTE — TOC Progression Note (Signed)
Transition of Care [facility]) - Progression Note    Patient Details  Name: Jonathan Wood MRN: [id number] Date of Birth: 08-25-1970  Transition of Care [facility]) CM/SW Contact  [name], RN Phone Number: 04/30/2019, 4:40 PM  Clinical Narrative:    Hx of lvad, drive line infection, conts on hep drip, iv abx. INR is 1.4, not therapeutic yet, may look into doing lovenox on dc per LVAD coordinaotorif still not therapeutic. He will need Ancefiv for 2 weeks 2 gms TID, Carolynn Sayers is aware. NCM also offered choice to patient for [facility], he states he does not have a preference. He states he daughter will be able to help with his dressing and iv abx.  The dressing is for the drive line, dry dressing daily per Staff RN. Daughter will be taught how to do the dressing, NCM made referral to Butch Penny with [facility], she states they are able to take referral.  Ethelda Chick is aware also.   Expected Discharge Plan: [place] Barriers to Discharge: Continued Medical Work up(inr not therapeutic)  Expected Discharge Plan and Services Expected Discharge Plan: [place]   Discharge Planning Services: CM Consult Post Acute Care Choice: [place] arrangements for the past 2 months: Single Family Home                 DME Arranged: (NA)         HH Arranged: RN [place] Agency: [place] ([place]) Date [place]: 04/30/19 Time [place]: 1640 Representative spoke with at [place]: [place] ([place]) Interventions    Readmission Risk Interventions No flowsheet data found.

## 2019-04-30 NOTE — Plan of Care (Signed)

## 2019-04-30 NOTE — Plan of Care (Signed)
  Problem: Education: Goal: Knowledge of General Education information will improve Description: Including pain rating scale, medication(s)/side effects and non-pharmacologic comfort measures Outcome: Progressing   Problem: Health Behavior/Discharge Planning: Goal: Ability to manage health-related needs will improve Outcome: Progressing   Problem: Clinical Measurements: Goal: Ability to maintain clinical measurements within normal limits will improve Outcome: Progressing Goal: Will remain free from infection Outcome: Progressing Goal: Diagnostic test results will improve Outcome: Progressing Goal: Respiratory complications will improve Outcome: Progressing Goal: Cardiovascular complication will be avoided Outcome: Progressing   Problem: Activity: Goal: Risk for activity intolerance will decrease Outcome: Progressing   Problem: Nutrition: Goal: Adequate nutrition will be maintained Outcome: Progressing   Problem: Coping: Goal: Level of anxiety will decrease Outcome: Progressing   Problem: Elimination: Goal: Will not experience complications related to bowel motility Outcome: Progressing Goal: Will not experience complications related to urinary retention Outcome: Progressing   Problem: Pain Managment: Goal: General experience of comfort will improve Outcome: Progressing   Problem: Safety: Goal: Ability to remain free from injury will improve Outcome: Progressing   Problem: Skin Integrity: Goal: Risk for impaired skin integrity will decrease Outcome: Progressing   Problem: Education: Goal: Knowledge of the prescribed therapeutic regimen will improve Outcome: Progressing   Problem: Activity: Goal: Risk for activity intolerance will decrease Outcome: Progressing   Problem: Cardiac: Goal: Ability to maintain an adequate cardiac output will improve Outcome: Progressing   Problem: Coping: Goal: Level of anxiety will decrease Outcome: Progressing   Problem:  Fluid Volume: Goal: Risk for excess fluid volume will decrease Outcome: Progressing   Problem: Clinical Measurements: Goal: Ability to maintain clinical measurements within normal limits will improve Outcome: Progressing Goal: Will remain free from infection Outcome: Progressing   Problem: Respiratory: Goal: Will regain and/or maintain adequate ventilation Outcome: Progressing   

## 2019-04-30 NOTE — Progress Notes (Signed)
ANTICOAGULATION CONSULT NOTE - Follow-Up Consult  Pharmacy Consult for heparin + warfarin Indication: LVAD  No Known Allergies  Patient Measurements: Height: 6\' 2"  (188 cm) Weight: 240 lb 1.3 oz (108.9 kg) IBW/kg (Calculated) : 82.2 Heparin Dosing Weight: 100kg  Vital Signs: Temp: 97.2 F (36.2 C) (12/14 0752) Temp Source: Oral (12/14 0752) BP: 101/61 (12/14 0752) Pulse Rate: 83 (12/14 0752)  Labs: Recent Labs    04/28/19 0716 04/28/19 2240 04/29/19 0019 04/29/19 0548 04/30/19 0246  HGB 10.0*  --  9.9*  --   --   HCT 32.9*  --  33.4*  --   --   PLT 222  --  229  --   --   LABPROT 15.2  --   --  16.8* 17.1*  INR 1.2  --   --  1.4* 1.4*  HEPARINUNFRC 0.79* 0.61  --  0.45 0.32  CREATININE 1.49*  --  1.46*  --  1.38*    Estimated Creatinine Clearance: 87.9 mL/min (A) (by C-G formula based on SCr of 1.38 mg/dL (H)).   Medical History: Past Medical History:  Diagnosis Date  . Asthma   . CHF (congestive heart failure) ([place])    a. 09/2016: EF 20-25% with cath showing normal cors  . GERD (gastroesophageal reflux disease)   . History of hiatal hernia   . OSA on CPAP 09/06/2018   Severe OSA with AHI 68/hr on CPAP at 12cm H2O    Assessment: 17 yoM with LVAD on warfarin PTA admitted with DLI. Warfarin initially held while deciding on I&D - none planned, warfarin resumed 12/9, heparin bridge continues.  INR today is unchanged at 1.4. LDH stable Heparin level at goal this morning. Noted plan for PICC today  **PTA Warfarin dose = 7.5mg  MW, 5mg  AODs  Goal of Therapy:  Heparin level 0.3-0.5 units/ml Monitor platelets by anticoagulation protocol: Yes   Plan:  -Warfarin 12.5mg  PO x1 tonight -Continue heparin at 1400 units/h -Stop heparin drip once INR > 1.7 -Daily INR, CBC, heparin level, LDH  Erin Hearing PharmD., BCPS Clinical Pharmacist 04/30/2019 9:39 AM

## 2019-04-30 NOTE — Progress Notes (Addendum)
Patient ID: Jonathan Wood, male   DOB: 09/28/1970, 48 y.o.   MRN: [id number]     Rounding Note.   PCP-Cardiologist: Arvilla Meres, MD   Reason for Admission: LVAD complication. Driveline infection  HPI:     Admitted from VAD clinic with drive line infection w/ abdominal wall cellulitis. Started vanc and cefepime>>changed to Ancef. WBC down to 11.7. Afebrile. Denies fever and chills.   Blood Cultures- NGTD  Wound Culture-  Staph Aureus (MSSA). Afebrile.   Volume stable. Wt continues to trend down. On PO torsemide.  SCr improved, 1.46>>1.38.   Remains on heparin gtt + warfarin, INR subtherapeutic at 1.4.   No active cardiac symptoms. Frustrated and wants to go home.   LVAD INTERROGATION:  HeartMate III LVAD:  Flow 5.0  liters/min, speed 5650, power 4.2, PI 2.9. No PI events     Allergies:  No Known Allergies  No current facility-administered medications on file prior to encounter.   Current Outpatient Medications on File Prior to Encounter  Medication Sig Dispense Refill  . amiodarone (PACERONE) 200 MG tablet Take 1 tablet (200 mg total) by mouth daily. 90 tablet 3  . citalopram (CELEXA) 10 MG tablet Take 1 tablet (10 mg total) by mouth daily. 90 tablet 5  . docusate sodium (COLACE) 100 MG capsule Take 1 capsule (100 mg total) by mouth 3 (three) times daily. 90 capsule 2  . doxycycline (ADOXA) 100 MG tablet Take 1 tablet (100 mg total) by mouth 2 (two) times daily for 14 days. 28 tablet 0  . ferrous sulfate 325 (65 FE) MG tablet Take 325 mg by mouth daily with breakfast.    . gabapentin (NEURONTIN) 300 MG capsule Take 1 capsule (300 mg total) by mouth 3 (three) times daily. 90 capsule 6  . hydrALAZINE (APRESOLINE) 25 MG tablet Take 1.5 tablets (37.5 mg total) by mouth every 8 (eight) hours. (Patient taking differently: Take 25-37.5 mg by mouth See admin instructions. Take 37.5mg  in the AM and PM and 25mg  Mid-day) 135 tablet 6  . hydrOXYzine (ATARAX/VISTARIL) 25 MG tablet  Take 25 mg by mouth every 8 (eight) hours as needed for anxiety.    Marland Kitchen linaclotide (LINZESS) 145 MCG CAPS capsule Take 1 capsule (145 mcg total) by mouth daily before breakfast. (Patient taking differently: Take 145 mcg by mouth every other day. ) 30 capsule 6  . pantoprazole (PROTONIX) 40 MG tablet Take 1 tablet (40 mg total) by mouth 2 (two) times daily. (Patient taking differently: Take 40 mg by mouth daily. ) 60 tablet 6  . sildenafil (REVATIO) 20 MG tablet Take 1 tablet (20 mg total) by mouth 3 (three) times daily. 90 tablet 5  . torsemide (DEMADEX) 20 MG tablet TAKE 1 TABLET BY MOUTH DAILY FOR 30 DAYS (Patient taking differently: Take 20 mg by mouth every other day. ) 30 tablet 3  . warfarin (COUMADIN) 5 MG tablet TAKE 1 AND 1/2 TABLETS BY MOUTH ON WEDNESDAY, AND FRIDAY AT 6:00 PM. TAKE 1 TABLET BY MOUTH ON SUNDAY, MONDAY, TUESDAY, THURSDAY, AND SATURDAY (Patient taking differently: Take 5-7.5 mg by mouth See admin instructions. On Mondays and Wednesdays take 7.5mg . Tuesdays, Thursdays, Fridays, Saturdays and Sundays take 5mg .) 180 tablet 3  . potassium chloride SA (KLOR-CON M20) 20 MEQ tablet Take 1 tablet (20 mEq total) by mouth daily. (Patient not taking: Reported on 04/24/2019) 90 tablet 3   . sodium chloride Stopped (04/28/19 1214)  .  ceFAZolin (ANCEF) IV 2 g (04/30/19 0646)  .  heparin 1,400 Units/hr (04/30/19 0600)    Objective:    Vital Signs:   Temp:  [97.2 F (36.2 C)-98.3 F (36.8 C)] 97.2 F (36.2 C) (12/14 0752) Pulse Rate:  [73-92] 83 (12/14 0752) Resp:  [20-28] 20 (12/14 0752) BP: (86-110)/(57-90) 101/61 (12/14 0752) SpO2:  [97 %-100 %] 100 % (12/14 0752) FiO2 (%):  [21 %] 21 % (12/14 0752) Weight:  [108.9 kg] 108.9 kg (12/14 0417) Last BM Date: 04/28/19 Filed Weights   04/28/19 0657 04/29/19 0309 04/30/19 0417  Weight: 110.1 kg 109.8 kg 108.9 kg    Mean arterial Pressure 80s  Physical Exam    General: Well appearing [name]. NAD.  HEENT: Normal. Anicteric Neck:  Supple, no JVD. Carotids OK.  Cardiac:  LVAD Hum   Lungs:  CTAB, normal effort. No wheeze Abdomen:  NT, + distention, no HSM. No bruits or masses. +BS  LVAD exit site: Well-healed and incorporated. Dressing dry and intact. No erythema or drainage. Stabilization device present and accurately applied. Driveline dressing changed daily per sterile technique. Extremities: no cyanosis, clubbing, rash, edema Neuro: alert & oriented x 3, cranial nerves grossly intact. moves all 4 extremities w/o difficulty. Affect pleasant    Telemetry   NSR 90s  personally reviewed.    EKG   N/A  Labs    Basic Metabolic Panel: Recent Labs  Lab 04/23/19 1350 04/24/19 0743 04/26/19 0220 04/27/19 0241 04/28/19 0716 04/29/19 0019 04/30/19 0246  NA 133*  --  134* 135 137 136 134*  K 3.5  --  4.2 4.1 4.2 4.3 4.3  CL 104  --  106 107 104 104 102  CO2 19*  --  19* 19* 22 21* 23  GLUCOSE 165*  --  220* 106* 121* 188* 125*  BUN 16  --  17 16 17 16 18   CREATININE 1.26*  --  1.39* 1.43* 1.49* 1.46* 1.38*  CALCIUM 8.2*  --  8.6* 9.0 9.2 9.2 9.1  MG 1.6* 2.4  --   --  1.8  --   --     Liver Function Tests: Recent Labs  Lab 04/23/19 1350  AST 29  ALT 21  ALKPHOS 89  BILITOT 2.5*  PROT 7.8  ALBUMIN 3.3*   No results for input(s): LIPASE, AMYLASE in the last 168 hours. No results for input(s): AMMONIA in the last 168 hours.  CBC: Recent Labs  Lab 04/25/19 0142 04/26/19 0220 04/27/19 0241 04/28/19 0716 04/29/19 0019  WBC 13.3* 12.4* 13.1* 12.3* 11.7*  HGB 9.9* 9.7* 9.8* 10.0* 9.9*  HCT 32.9* 32.7* 32.6* 32.9* 33.4*  MCV 90.1 92.6 90.6 90.4 90.8  PLT 212 224 218 222 229    Cardiac Enzymes: No results for input(s): CKTOTAL, CKMB, CKMBINDEX, TROPONINI in the last 168 hours.  BNP: BNP (last 3 results) Recent Labs    03/21/19 1415 04/23/19 1350  BNP 426.9* 709.6*    ProBNP (last 3 results) No results for input(s): PROBNP in the last 8760 hours.   CBG: No results for input(s):  GLUCAP in the last 168 hours.  Coagulation Studies: Recent Labs    04/28/19 0716 04/29/19 0548 04/30/19 0246  LABPROT 15.2 16.8* 17.1*  INR 1.2 1.4* 1.4*      Imaging    No results found.     Assessment/Plan:    1. LVAD Complication, Driveline infection.  - WBC up slightly from 12>>13>>11. Afebrile. Abdominal wall cellulitis. - CT abdomen- Ill-defined infiltrative changes surrounding the line which enters the anterior upper abdominal  wall most likely representing infection.  - Blood Cultures- NGTD  - Wound Culture- staph  Aureus.(MSSA) -  IV antibiotics per Dr. Donata Clay. Plan PICC line for continuation of abx x 2 weeks.  IV team to place PICC today - CT surgery appreciated.   - LDH 208. INR 1.4. On coumadin + heparin gtt overlap until INR > 1.7.    2. 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  - Volume status improving. Wt down additional 2lb. Continue torsemide 20 mg daily.  - Continue hydralazine 50 mg three times a day.  - We tried entresto in the past but he developed AKI. -Continue sildenafil for RV failure. No bb or imdur for that reason.    - INR 1.4. Continue Heparin drip +  coumadin. PharmD assisting. - VAD interrogated personally. Parameters stable.  3. HTN - MAP stable in 80s. Continue current meds.   4. Anemia - Hgb stable this admit. No CBC ordered today. No obvious source of bleeding.   Length of Stay: [address], [place]  9:32 AM   04/30/2019, 9:32 AM  VAD Team Pager [phone] (7am - 7am) +++VAD ISSUES ONLY+++   Advanced Heart Failure Team Pager [phone] (M-F; 7a - 4p)  Please contact [facility] for night-coverage after hours (4p -7a ) and weekends on [email or web address] for all non- LVAD Issues  Patient seen and examined with the above-signed Advanced Practice Provider and/or Housestaff. I personally reviewed laboratory data, imaging studies and relevant notes. I independently examined the patient and formulated  the important aspects of the plan. I have edited the note to reflect any of my changes or salient points. I have personally discussed the plan with the patient and/or family.  Volume status improved. Now on po torsemide.   Infection/cellulitis improved on IV abx.   Unfortunately INR remains low at 1.4. On heparin without bleeding.   Will continue heparin. Increase warfarin. Discussed dosing with PharmD personally.  Continue Ancef. Would not place PICC until closer to time of d/c. (Will need 2 weeks of IV abx).   VAD interrogated personally. Parameters stable.  Arvilla Meres, MD  11:10 AM

## 2019-04-30 NOTE — Progress Notes (Signed)
Peripherally Inserted Central Catheter/Midline Placement  The IV Nurse has discussed with the patient and/or persons authorized to consent for the patient, the purpose of this procedure and the potential benefits and risks involved with this procedure.  The benefits include less needle sticks, lab draws from the catheter, and the patient may be discharged home with the catheter. Risks include, but not limited to, infection, bleeding, blood clot (thrombus formation), and puncture of an artery; nerve damage and irregular heartbeat and possibility to perform a PICC exchange if needed/ordered by physician.  Alternatives to this procedure were also discussed.  Bard Power PICC patient education guide, fact sheet on infection prevention and patient information card has been provided to patient /or left at bedside.    PICC/Midline Placement Documentation  PICC Single Lumen 82/80/03 PICC Right Basilic 43 cm 0 cm (Active)  Indication for Insertion or Continuance of Line Home intravenous therapies (PICC only) 04/30/19 2033  Exposed Catheter (cm) 0 cm 04/30/19 2033  Site Assessment Clean;Dry;Intact 04/30/19 2033  Line Status Blood return noted;Flushed;Saline locked 04/30/19 2033  Dressing Type Transparent;Occlusive;Securing device 04/30/19 2033  Dressing Status Clean;Dry;Intact;Antimicrobial disc in place 04/30/19 2033  Line Adjustment (NICU/IV Team Only) No 04/30/19 2033  Dressing Intervention New dressing 04/30/19 2033  Dressing Change Due 05/07/19 04/30/19 2033       Edson Snowball 04/30/2019, 8:57 PM

## 2019-04-30 NOTE — Progress Notes (Signed)
LVAD Coordinator Rounding Note:  Admitted 04/23/19 by Dr. [name] due to VAD drive line infection.    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 Cyndia Bent under Destination Therapy criteria.  Pt is frustrated as he would like to go home. Pt doesn't have a trained family member/caregiver to perform his daily dressing changes. We would like to train his daughter. For the last month pt was supposed to bring his daughter to clinic for [place] to train but this has not happened. Pt was questioned again today who will change his dressings at home? Pt states that his daughter will come the day of discharge so that we can teach her to do the dressing change.   Pt will require home IV antibiotics for 2 weeks and placement of picc line.  Vital signs: Temp: 97.2 HR: 83 Doppler Pressure: 94 Automatic BP: 101/61(71) O2 Sat: 100% RA Wt: 243.8>245>249.7>246.9>240 lbs   LVAD interrogation reveals:  Speed: 5600 Flow: 5 Power:  4.3 PI: 2.7 Alarms: none Events: none Hematocrit: 32  Fixed speed: 5600 Low speed limit: 5300  Drive Line: CDI. Daily dressing changes using silver strip. Dressing change due today by bedside nurse. May be able to advance to qod.  Labs:  LDH trend: 284>282>242>232>208  INR trend: 1.6>1.5>1.3>1.2>1.4  Anticoagulation Plan: -INR Goal: 2.0 - 2.5  -ASA Dose: none -Heparin gtt 1400 u/hr  Adverse Events: - driveline cx 50/5/39>> staph aureus  Plan/Recommendations:  1. Call VAD pager if any VAD equipment or drive line issues.  [name] RN, [place] Coordinator [phone]

## 2019-04-30 NOTE — TOC Progression Note (Addendum)
Transition of Care [facility]) - Progression Note    Patient Details  Name: Jonathan Wood MRN: [id number] Date of Birth: Apr 24, 1971  Transition of Care [facility]) CM/SW Contact  [name], RN Phone Number: 04/30/2019, 2:25 PM  Clinical Narrative:     Hx of lvad, drive line infection, conts on hep drip, iv abx.  INR is 1.4, not therapeutic yet, may look into doing lovenox on dc per LVAD coordinaotor if still not therapeutic.  He will need Ancef iv for 2 weeks 2 gms TID, Carolynn Sayers is aware. NCM also offered choice to patient for [facility], he states he does not have a preference. He states he daughter will be able to help with his dressing and iv abx.           Expected Discharge Plan and Services                                                 Social Determinants of Health (SDOH) Interventions    Readmission Risk Interventions No flowsheet data found.

## 2019-04-30 NOTE — TOC Progression Note (Addendum)
Transition of Care [facility]) - Progression Note    Patient Details  Name: Jonathan Wood MRN: [id number] Date of Birth: February 04, 1971  Transition of Care [facility]) CM/SW Contact  [name], RN Phone Number: 04/30/2019, 2:47 PM  Clinical Narrative:     Hx of lvad, drive line infection, conts on hep drip, iv abx.  INR is 1.4, not therapeutic yet, may look into doing lovenox on dc per LVAD coordinaotor if still not therapeutic.  He will need Ancef iv for 2 weeks 2 gms TID, Carolynn Sayers is aware. NCM also offered choice to patient for [facility], he states he does not have a preference. He states he daughter will be able to help with his dressing and iv abx.  The dressing is for the drive line, dry dressing daily per Staff RN. Daughter will be taught how to do the dressing. Cory with [facility] states not able to take due to staffing, NCM made referral to [place] with [facility]. Tiffany with [facility] is unable to take patient as well. NCM made referral to [facility] with [facility].     Expected Discharge Plan: [place] Barriers to Discharge: Continued Medical Work up(INR not therapeutic)  Expected Discharge Plan and Services Expected Discharge Plan: [place]   Discharge Planning Services: CM Consult Post Acute Care Choice: [place] arrangements for the past 2 months: Single Family Home                 DME Arranged: (NA)         HH Arranged: RN [place] Agency: [place] Date [place]: 04/30/19 Time [place]: 8338 Representative spoke with at [place]: [place] ([place]) Interventions    Readmission Risk Interventions No flowsheet data found.

## 2019-05-01 DIAGNOSIS — B9561 Methicillin susceptible Staphylococcus aureus infection as the cause of diseases classified elsewhere: Secondary | ICD-10-CM

## 2019-05-01 DIAGNOSIS — F1010 Alcohol abuse, uncomplicated: Secondary | ICD-10-CM

## 2019-05-01 DIAGNOSIS — G4733 Obstructive sleep apnea (adult) (pediatric): Secondary | ICD-10-CM

## 2019-05-01 DIAGNOSIS — Z9989 Dependence on other enabling machines and devices: Secondary | ICD-10-CM

## 2019-05-01 DIAGNOSIS — Z95811 Presence of heart assist device: Secondary | ICD-10-CM

## 2019-05-01 DIAGNOSIS — K219 Gastro-esophageal reflux disease without esophagitis: Secondary | ICD-10-CM

## 2019-05-01 DIAGNOSIS — F17211 Nicotine dependence, cigarettes, in remission: Secondary | ICD-10-CM

## 2019-05-01 DIAGNOSIS — I5042 Chronic combined systolic (congestive) and diastolic (congestive) heart failure: Secondary | ICD-10-CM

## 2019-05-01 LAB — BASIC METABOLIC PANEL
Anion gap: 10 (ref 5–15)
BUN: 18 mg/dL (ref 6–20)
CO2: 24 mmol/L (ref 22–32)
Calcium: 9.3 mg/dL (ref 8.9–10.3)
Chloride: 102 mmol/L (ref 98–111)
Creatinine, Ser: 1.64 mg/dL — ABNORMAL HIGH (ref 0.61–1.24)
GFR calc Af Amer: 57 mL/min — ABNORMAL LOW (ref >60)
GFR calc non Af Amer: 49 mL/min — ABNORMAL LOW (ref >60)
Glucose, Bld: 104 mg/dL — ABNORMAL HIGH (ref 70–99)
Potassium: 4.6 mmol/L (ref 3.5–5.1)
Sodium: 136 mmol/L (ref 135–145)

## 2019-05-01 LAB — CBC
HCT: 34.7 % — ABNORMAL LOW (ref 39.0–52.0)
Hemoglobin: 10.3 g/dL — ABNORMAL LOW (ref 13.0–17.0)
MCH: 27.3 pg (ref 26.0–34.0)
MCHC: 29.7 g/dL — ABNORMAL LOW (ref 30.0–36.0)
MCV: 92 fL (ref 80.0–100.0)
Platelets: 192 10*3/uL (ref 150–400)
RBC: 3.77 MIL/uL — ABNORMAL LOW (ref 4.22–5.81)
RDW: 22.4 % — ABNORMAL HIGH (ref 11.5–15.5)
WBC: 8.2 10*3/uL (ref 4.0–10.5)
nRBC: 0 % (ref 0.0–0.2)

## 2019-05-01 LAB — LACTATE DEHYDROGENASE: LDH: 210 U/L — ABNORMAL HIGH (ref 98–192)

## 2019-05-01 LAB — HEPARIN LEVEL (UNFRACTIONATED): Heparin Unfractionated: 0.24 IU/mL — ABNORMAL LOW (ref 0.30–0.70)

## 2019-05-01 LAB — PROTIME-INR
INR: 1.8 — ABNORMAL HIGH (ref 0.8–1.2)
Prothrombin Time: 20.8 seconds — ABNORMAL HIGH (ref 11.4–15.2)

## 2019-05-01 MED ORDER — POTASSIUM CHLORIDE CRYS ER 20 MEQ PO TBCR: 20.0000 meq | EXTENDED_RELEASE_TABLET | Freq: Every day | ORAL | 3 refills | Status: DC

## 2019-05-01 MED ORDER — HYDRALAZINE HCL 50 MG PO TABS: 50.0000 mg | ORAL_TABLET | Freq: Three times a day (TID) | ORAL | 3 refills | Status: DC

## 2019-05-01 MED ORDER — WARFARIN SODIUM 5 MG PO TABS: ORAL_TABLET | ORAL | 3 refills | Status: DC

## 2019-05-01 MED ORDER — DOXYCYCLINE HYCLATE 100 MG PO TABS: 100.0000 mg | ORAL_TABLET | Freq: Two times a day (BID) | ORAL | 0 refills | Status: AC

## 2019-05-01 MED FILL — DOXYCYCLINE HYCLATE 100 MG: 100 | 10 days supply | Qty: 20 | Fill #0

## 2019-05-01 MED FILL — POTASSIUM CL ER 20 MEQ TABL: 20 | 30 days supply | Qty: 30 | Fill #0

## 2019-05-01 MED FILL — hydrALAZINE HCL 50 MG TABS: 50 | 30 days supply | Qty: 90 | Fill #0

## 2019-05-01 NOTE — Plan of Care (Signed)
  Problem: Cardiac: Goal: Ability to maintain an adequate cardiac output will improve 05/01/2019 1256 by Shanon Ace, RN Outcome: Adequate for Discharge 05/01/2019 1256 by Shanon Ace, RN Outcome: Adequate for Discharge   Problem: Cardiac: Goal: Ability to maintain an adequate cardiac output will improve 05/01/2019 1256 by Shanon Ace, RN Outcome: Adequate for Discharge 05/01/2019 1256 by Shanon Ace, RN Outcome: Adequate for Discharge   Problem: Coping: Goal: Level of anxiety will decrease 05/01/2019 1256 by Shanon Ace, RN Outcome: Adequate for Discharge 05/01/2019 1256 by Shanon Ace, RN Outcome: Adequate for Discharge   Problem: Fluid Volume: Goal: Risk for excess fluid volume will decrease 05/01/2019 1256 by Shanon Ace, RN Outcome: Adequate for Discharge 05/01/2019 1256 by Shanon Ace, RN Outcome: Adequate for Discharge   Problem: Clinical Measurements: Goal: Ability to maintain clinical measurements within normal limits will improve 05/01/2019 1256 by Shanon Ace, RN Outcome: Adequate for Discharge 05/01/2019 1256 by Shanon Ace, RN Outcome: Adequate for Discharge Goal: Will remain free from infection 05/01/2019 1256 by Shanon Ace, RN Outcome: Adequate for Discharge 05/01/2019 1256 by Shanon Ace, RN Outcome: Adequate for Discharge   Problem: Respiratory: Goal: Will regain and/or maintain adequate ventilation 05/01/2019 1256 by Shanon Ace, RN Outcome: Adequate for Discharge 05/01/2019 1256 by Shanon Ace, RN Outcome: Adequate for Discharge

## 2019-05-01 NOTE — Progress Notes (Addendum)
LVAD Coordinator Rounding Note:  Admitted 04/23/19 by Dr. [name] due to VAD drive line infection.    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 Cyndia Bent under Destination Therapy criteria.  Pt sitting up in bed ready to go home. ID saw pt this morning and recommended Doxycycline for home antibiotics. Daughter in the room for training on driveline dressing changes.   Vital signs: Temp: 97.6 HR: 90 Doppler Pressure: 92 Automatic BP: 94/73(81) O2 Sat: 97% RA Wt: 243.8>245>249.7>246.9>240>238.3 lbs   LVAD interrogation reveals:  Speed: 5600 Flow: 5 Power:  4.2 PI: 2.5 Alarms: none Events: none Hematocrit: 34  Fixed speed: 5600 Low speed limit: 5300  Drive Line: currently doing daily dressing changes using silver strip. Existing VAD dressing removed and site care performed using sterile technique by VAD coordinator while teaching daughter. Drive line exit site cleaned with Chlora prep applicators x 2, allowed to dry, and gauze dressing with silver strip re-applied. Exit site healed and incorporated, the velour is fully implanted at exit site. Scant amount of yellow drainage. No redness, tenderness,  foul odor or rash noted. Drive line anchor re-applied. May advance to every other day dressing changes.     Daughter practicing donning sterile gloves. She did very well. She was instructed to call the VAD pager for increased drainage, any redness or tenderness and/or foul odor. She verbalized understanding and stated that she felt comfortable to perform dressing changes.   Labs:  LDH trend: 284>282>242>232>208>210  INR trend: 1.6>1.5>1.3>1.2>1.4>1.8  Anticoagulation Plan: -INR Goal: 2.0 - 2.5  -ASA Dose: none -Heparin gtt 1400 u/hr  Adverse Events: - driveline cx 13/0/86>> staph aureus  Plan/Recommendations:  1. Call VAD pager if any VAD equipment or drive line issues. 2. Pt is ok to d/c to home, daughter educated. Pt will go home on Doxycycline per ID. Pt has f/u next Tuesday  12/22 at 0900.  [name] RN, [place] Coordinator [phone]

## 2019-05-01 NOTE — Plan of Care (Signed)
  Problem: Health Behavior/Discharge Planning: Goal: Ability to manage health-related needs will improve Outcome: Adequate for Discharge   Problem: Education: Goal: Knowledge of General Education information will improve Description: Including pain rating scale, medication(s)/side effects and non-pharmacologic comfort measures Outcome: Adequate for Discharge   Problem: Clinical Measurements: Goal: Ability to maintain clinical measurements within normal limits will improve Outcome: Adequate for Discharge Goal: Will remain free from infection Outcome: Adequate for Discharge Goal: Diagnostic test results will improve Outcome: Adequate for Discharge Goal: Respiratory complications will improve Outcome: Adequate for Discharge Goal: Cardiovascular complication will be avoided Outcome: Adequate for Discharge   Problem: Activity: Goal: Risk for activity intolerance will decrease Outcome: Adequate for Discharge   Problem: Nutrition: Goal: Adequate nutrition will be maintained Outcome: Adequate for Discharge   Problem: Coping: Goal: Level of anxiety will decrease Outcome: Adequate for Discharge   Problem: Elimination: Goal: Will not experience complications related to bowel motility Outcome: Adequate for Discharge Goal: Will not experience complications related to urinary retention Outcome: Adequate for Discharge   Problem: Pain Managment: Goal: General experience of comfort will improve Outcome: Adequate for Discharge   Problem: Safety: Goal: Ability to remain free from injury will improve Outcome: Adequate for Discharge   Problem: Skin Integrity: Goal: Risk for impaired skin integrity will decrease Outcome: Adequate for Discharge   Problem: Education: Goal: Knowledge of the prescribed therapeutic regimen will improve Outcome: Adequate for Discharge   Problem: Activity: Goal: Risk for activity intolerance will decrease Outcome: Adequate for Discharge   Problem:  Cardiac: Goal: Ability to maintain an adequate cardiac output will improve Outcome: Adequate for Discharge   Problem: Coping: Goal: Level of anxiety will decrease Outcome: Adequate for Discharge   Problem: Fluid Volume: Goal: Risk for excess fluid volume will decrease Outcome: Adequate for Discharge   Problem: Clinical Measurements: Goal: Ability to maintain clinical measurements within normal limits will improve Outcome: Adequate for Discharge Goal: Will remain free from infection Outcome: Adequate for Discharge   Problem: Respiratory: Goal: Will regain and/or maintain adequate ventilation Outcome: Adequate for Discharge

## 2019-05-01 NOTE — Consult Note (Signed)
[facility]    Date of Admission:  04/23/2019     Total days of antibiotics 9               Reason for Consult: Drive line infection    Referring Provider: Bensimhon Primary Care Provider: Patient, No Pcp Per   ASSESSMENT:  Jonathan Wood is a 48 y/o male with MSSA infections associated with his driveline of his left ventricular assist device. CT abdomen with infiltrative changes with concern for infection and without abscess or fluid collections indicating this is likely a superficial infection. CVTS recommended 2 weeks of IV antibiotics for his abdominal wall cellulitis. Primary team has concerns with Jonathan Wood going home with PICC line. Recommend treatment with doxycycline 100 mg twice daily which has good bioavailability and coverage for MSSA. Continue wound care per LVAD and Heart Failure team. Transition of care pharmacy will bring doxycycline to bedside prior to discharge.   PLAN:  1. Discontinue Cefazolin at discharge and start doxycycline 100 mg bid through 12/25.  2. PICC line removal per primary team. 3. Transition of care pharmacy will bring doxycycline to beside.    Principal Problem:   Infection associated with driveline of left ventricular assist device (LVAD) (HCC) Active Problems:   LVAD (left ventricular assist device) present (HCC)   Chronic combined systolic (congestive) and diastolic (congestive) heart failure (HCC)   Left ventricular assist device (LVAD) complication   . amiodarone  200 mg Oral Daily  . Chlorhexidine Gluconate Cloth  6 each Topical Daily  . Chlorhexidine Gluconate Cloth  6 each Topical Daily  . dextromethorphan-guaiFENesin  1 tablet Oral BID  . docusate sodium  100 mg Oral TID  . gabapentin  300 mg Oral TID  . hydrALAZINE  50 mg Oral Q8H  . linaclotide  145 mcg Oral QODAY  . pantoprazole  40 mg Oral Daily  . potassium chloride SA  20 mEq Oral Daily  . sildenafil  20 mg Oral TID  . torsemide  20 mg Oral  Daily  . Warfarin - Pharmacist Dosing Inpatient   Does not apply q1800     HPI: Jonathan Wood is a 48 y.o. male with previous medical history of OSA on CPAP, alcohol abuse, tobacco use, GERD, congestive heart failure s/p LVAD insertion in April 2019 admitted following dressing change with moderate amount of exudate with subsequnet abdominal pain. Marland Kitchen. Previously prescribed doxycycline, however was never started.   Jonathan Wood has been afebrile since admission with one elevated temperature of 99.5 and initial leukocytosis of 15.3 which has improved to 8.2.  CT abdomen with infiltrative changes with concern for infection and without abscess or fluid collections indicating likely superficial infection. Cultures obtain positive for MSSA. Blood cultures finalized without growth to date.  Jonathan Wood is currently on Day 9 of total antibiotic therapy (Day 6 of Cefazolin). ID has been requested to make oral antibiotic regimens for continuation of home care.   Review of Systems: Review of Systems  Constitutional: Negative for chills, fever and weight loss.  Respiratory: Negative for cough, shortness of breath and wheezing.   Cardiovascular: Negative for chest pain and leg swelling.  Gastrointestinal: Negative for abdominal pain, constipation, diarrhea, nausea and vomiting.  Skin: Negative for rash.     Past Medical History:  Diagnosis Date  . Asthma   . CHF (congestive heart failure) (HCC)    a. 09/2016: EF 20-25% with cath showing normal cors  . GERD (gastroesophageal  reflux disease)   . History of hiatal hernia   . OSA on CPAP 09/06/2018   Severe OSA with AHI 68/hr on CPAP at 12cm H2O    Social History   Tobacco Use  . Smoking status: Current Every Day Smoker    Packs/day: 0.50    Years: 25.00    Pack years: 12.50    Types: Cigarettes  . Smokeless tobacco: Never Used  . Tobacco comment: quit 1 week  Substance Use Topics  . Alcohol use: Yes    Comment: occas  . Drug use: No      Family History  Problem Relation Age of Onset  . Hypertension Father   . Colon cancer Neg Hx   . Esophageal cancer Neg Hx   . Inflammatory bowel disease Neg Hx   . Liver disease Neg Hx   . Pancreatic cancer Neg Hx   . Rectal cancer Neg Hx   . Stomach cancer Neg Hx     No Known Allergies  OBJECTIVE: Blood pressure 103/86, pulse 90, temperature 97.6 F (36.4 C), temperature source Oral, resp. rate (!) 24, height 6\' 2"  (1.88 m), weight 108.1 kg, SpO2 97 %.  Physical Exam Constitutional:      General: He is not in acute distress.    Appearance: He is well-developed.     Comments: Seated in bed; pleasant.   Neck:     Comments: LVAD hum present.  Cardiovascular:     Rate and Rhythm: Normal rate and regular rhythm.  Pulmonary:     Effort: Pulmonary effort is normal.     Breath sounds: Normal breath sounds.  Skin:    General: Skin is warm and dry.  Neurological:     Mental Status: He is alert and oriented to person, place, and time.  Psychiatric:        Mood and Affect: Mood normal.     Lab Results Lab Results  Component Value Date   WBC 8.2 05/01/2019   HGB 10.3 (L) 05/01/2019   HCT 34.7 (L) 05/01/2019   MCV 92.0 05/01/2019   PLT 192 05/01/2019    Lab Results  Component Value Date   CREATININE 1.64 (H) 05/01/2019   BUN 18 05/01/2019   NA 136 05/01/2019   K 4.6 05/01/2019   CL 102 05/01/2019   CO2 24 05/01/2019    Lab Results  Component Value Date   ALT 21 04/23/2019   AST 29 04/23/2019   ALKPHOS 89 04/23/2019   BILITOT 2.5 (H) 04/23/2019     Microbiology: Recent Results (from the past 240 hour(s))  Culture, blood (Routine X 2) w Reflex to ID Panel     Status: None   Collection Time: 04/23/19  1:25 PM   Specimen: BLOOD  Result Value Ref Range Status   Specimen Description BLOOD LEFT ANTECUBITAL  Final   Special Requests   Final    BOTTLES DRAWN AEROBIC AND ANAEROBIC Blood Culture adequate volume   Culture   Final    NO GROWTH 5 DAYS Performed  at [facility], [address] [place], [place]    Report Status 04/28/2019 FINAL  Final  Culture, blood (Routine X 2) w Reflex to ID Panel     Status: None   Collection Time: 04/23/19  1:35 PM   Specimen: BLOOD RIGHT HAND  Result Value Ref Range Status   Specimen Description BLOOD RIGHT HAND  Final   Special Requests   Final    BOTTLES DRAWN  AEROBIC AND ANAEROBIC Blood Culture adequate volume   Culture   Final    NO GROWTH 5 DAYS Performed at [facility], [place] [address], [place], [place] [address]    Report Status 04/28/2019 FINAL  Final  Aerobic Culture (superficial specimen)     Status: None   Collection Time: 04/23/19  2:20 PM   Specimen: Wound  Result Value Ref Range Status   Specimen Description WOUND LVAD DRIVELINE SIGHT  Final   Special Requests NONE  Final   Gram Stain   Final    RARE WBC PRESENT,BOTH PMN AND MONONUCLEAR RARE GRAM POSITIVE COCCI Performed at [facility], [place] [address], [place], [place] [address]    Culture RARE STAPHYLOCOCCUS AUREUS  Final   Report Status 04/26/2019 FINAL  Final   Organism ID, Bacteria STAPHYLOCOCCUS AUREUS  Final      Susceptibility   Staphylococcus aureus - MIC*    CIPROFLOXACIN <=0.5 SENSITIVE Sensitive     ERYTHROMYCIN <=0.25 SENSITIVE Sensitive     GENTAMICIN <=0.5 SENSITIVE Sensitive     OXACILLIN 0.5 SENSITIVE Sensitive     TETRACYCLINE <=1 SENSITIVE Sensitive     VANCOMYCIN 1 SENSITIVE Sensitive     TRIMETH/SULFA <=10 SENSITIVE Sensitive     CLINDAMYCIN <=0.25 SENSITIVE Sensitive     RIFAMPIN <=0.5 SENSITIVE Sensitive     Inducible Clindamycin NEGATIVE Sensitive     * RARE STAPHYLOCOCCUS AUREUS  Respiratory Panel by RT PCR (Flu A&B, Covid) -     Status: None   Collection Time: 04/23/19  3:51 PM  Result Value Ref Range Status   SARS Coronavirus 2 by RT PCR NEGATIVE NEGATIVE Final    Comment: (NOTE) SARS-CoV-2 target nucleic acids are NOT DETECTED. The SARS-CoV-2 RNA is generally  detectable in upper respiratoy specimens during the acute phase of infection. The lowest concentration of SARS-CoV-2 viral copies this assay can detect is 131 copies/mL. A negative result does not preclude SARS-Cov-2 infection and should not be used as the sole basis for treatment or other patient management decisions. A negative result may occur with  improper specimen collection/handling, submission of specimen other than nasopharyngeal swab, presence of viral mutation(s) within the areas targeted by this assay, and inadequate number of viral copies (<131 copies/mL). A negative result must be combined with clinical observations, patient history, and epidemiological information. The expected result is Negative. Fact Sheet for Patients:  PinkCheek.be Fact Sheet for Healthcare Providers:  GravelBags.it This test is not yet ap proved or cleared by the [place] FDA and  has been authorized for detection and/or diagnosis of SARS-CoV-2 by FDA under an Emergency Use Authorization (EUA). This EUA will remain  in effect (meaning this test can be used) for the duration of the COVID-19 declaration under Section 564(b)(1) of the Act, 21 U.S.C. section 360bbb-3(b)(1), unless the authorization is terminated or revoked sooner.    Influenza A by PCR NEGATIVE NEGATIVE Final   Influenza B by PCR NEGATIVE NEGATIVE Final    Comment: (NOTE) The Xpert Xpress SARS-CoV-2/FLU/RSV assay is intended as an aid in  the diagnosis of influenza from Nasopharyngeal swab specimens and  should not be used as a sole basis for treatment. Nasal washings and  aspirates are unacceptable for Xpert Xpress SARS-CoV-2/FLU/RSV  testing. Fact Sheet for Patients: PinkCheek.be Fact Sheet for Healthcare Providers: GravelBags.it This test is not yet approved or cleared by the [place] FDA and  has been  authorized for detection and/or diagnosis of SARS-CoV-2 by  FDA under an Emergency Use Authorization (EUA). This EUA will remain  in effect (meaning this test can be used) for the duration of the  Covid-19 declaration under Section 564(b)(1) of the Act, 21  U.S.C. section 360bbb-3(b)(1), unless the authorization is  terminated or revoked. Performed at [facility], [address], [place], [place] [address]   MRSA PCR Screening     Status: None   Collection Time: 04/24/19  6:11 AM   Specimen: Nasal Mucosa; Nasopharyngeal  Result Value Ref Range Status   MRSA by PCR NEGATIVE NEGATIVE Final    Comment:        The GeneXpert MRSA Assay (FDA approved for NASAL specimens only), is one component of a comprehensive MRSA colonization surveillance program. It is not intended to diagnose MRSA infection nor to guide or monitor treatment for MRSA infections. Performed at [facility], [address], [place], [place] [address]   Surgical pcr screen     Status: Abnormal   Collection Time: 04/24/19  6:11 AM   Specimen: Nasal Mucosa; Nasal Swab  Result Value Ref Range Status   MRSA, PCR NEGATIVE NEGATIVE Final   Staphylococcus aureus POSITIVE (A) NEGATIVE Final    Comment: (NOTE) The Xpert SA Assay (FDA approved for NASAL specimens in patients 80 years of age and older), is one component of a comprehensive surveillance program. It is not intended to diagnose infection nor to guide or monitor treatment. Performed at [facility], [address] [place], [place] [address]      Marcos Eke, NP [facility] [phone] Pager  05/01/2019  8:50 AM

## 2019-05-01 NOTE — Progress Notes (Addendum)
Patient ID: Jonathan Wood, male   DOB: Nov 25, 1970, 48 y.o.   MRN: [id number]     Rounding Note.   PCP-Cardiologist: Arvilla Meres, MD   Reason for Admission: LVAD complication. Driveline infection  HPI:     Admitted from VAD clinic with drive line infection w/ abdominal wall cellulitis. Started vanc and cefepime>>changed to Ancef. Abdominal wall cellulitis improved. WBC ct WNL at 8.2 today. Afebrile.   Blood Cultures- NGTD  Wound Culture-  Staph Aureus (MSSA). Afebrile.   Volume stable. Wt continues to trend down, down another 2 lb On PO torsemide.  SCr trending up 1.36>>1.64. BUN stable at 18.   Remains on heparin gtt + warfarin, INR up to 1.8 today.   No dyspnea. Denies fever/ chills. Eager to go home.   ID has seen. They recommend no PICC line. Discontinue Cefazolin at discharge and start doxycycline 100 mg bid through 12/25.   LVAD INTERROGATION:  HeartMate III LVAD:  Flow 5.1  liters/min, speed 5600, power 4.2, PI 2.6. No PI events     Allergies:  No Known Allergies  No current facility-administered medications on file prior to encounter.   Current Outpatient Medications on File Prior to Encounter  Medication Sig Dispense Refill  . amiodarone (PACERONE) 200 MG tablet Take 1 tablet (200 mg total) by mouth daily. 90 tablet 3  . citalopram (CELEXA) 10 MG tablet Take 1 tablet (10 mg total) by mouth daily. 90 tablet 5  . docusate sodium (COLACE) 100 MG capsule Take 1 capsule (100 mg total) by mouth 3 (three) times daily. 90 capsule 2  . doxycycline (ADOXA) 100 MG tablet Take 1 tablet (100 mg total) by mouth 2 (two) times daily for 14 days. 28 tablet 0  . ferrous sulfate 325 (65 FE) MG tablet Take 325 mg by mouth daily with breakfast.    . gabapentin (NEURONTIN) 300 MG capsule Take 1 capsule (300 mg total) by mouth 3 (three) times daily. 90 capsule 6  . hydrALAZINE (APRESOLINE) 25 MG tablet Take 1.5 tablets (37.5 mg total) by mouth every 8 (eight) hours. (Patient taking  differently: Take 25-37.5 mg by mouth See admin instructions. Take 37.5mg  in the AM and PM and 25mg  Mid-day) 135 tablet 6  . hydrOXYzine (ATARAX/VISTARIL) 25 MG tablet Take 25 mg by mouth every 8 (eight) hours as needed for anxiety.    linaclotide (LINZESS) 145 MCG CAPS capsule Take 1 capsule (145 mcg total) by mouth daily before breakfast. (Patient taking differently: Take 145 mcg by mouth every other day. ) 30 capsule 6  . pantoprazole (PROTONIX) 40 MG tablet Take 1 tablet (40 mg total) by mouth 2 (two) times daily. (Patient taking differently: Take 40 mg by mouth daily. ) 60 tablet 6  . sildenafil (REVATIO) 20 MG tablet Take 1 tablet (20 mg total) by mouth 3 (three) times daily. 90 tablet 5  . torsemide (DEMADEX) 20 MG tablet TAKE 1 TABLET BY MOUTH DAILY FOR 30 DAYS (Patient taking differently: Take 20 mg by mouth every other day. ) 30 tablet 3  . warfarin (COUMADIN) 5 MG tablet TAKE 1 AND 1/2 TABLETS BY MOUTH ON WEDNESDAY, AND FRIDAY AT 6:00 PM. TAKE 1 TABLET BY MOUTH ON SUNDAY, MONDAY, TUESDAY, THURSDAY, AND SATURDAY (Patient taking differently: Take 5-7.5 mg by mouth See admin instructions. On Mondays and Wednesdays take 7.5mg . Tuesdays, Thursdays, Fridays, Saturdays and Sundays take 5mg .) 180 tablet 3  . potassium chloride SA (KLOR-CON M20) 20 MEQ tablet Take 1 tablet (20 mEq  total) by mouth daily. (Patient not taking: Reported on 04/24/2019) 90 tablet 3   . sodium chloride Stopped (04/28/19 1214)  .  ceFAZolin (ANCEF) IV 2 g (05/01/19 0544)    Objective:    Vital Signs:   Temp:  [97.6 F (36.4 C)-98.3 F (36.8 C)] 97.6 F (36.4 C) (12/15 0749) Pulse Rate:  [79-90] 90 (12/15 0749) Resp:  [16-24] 24 (12/15 0749) BP: (94-119)/(49-95) 94/73 (12/15 0858) SpO2:  [96 %-100 %] 97 % (12/15 0749) Weight:  [108.1 kg] 108.1 kg (12/15 0520) Last BM Date: 04/28/19 Filed Weights   04/29/19 0309 04/30/19 0417 05/01/19 0520  Weight: 109.8 kg 108.9 kg 108.1 kg    Mean arterial Pressure  80s  Physical Exam    General: Well appearing [name]. NAD.  HEENT: Normal. Anicteric Neck: Supple, no JVD. No bruits.  Cardiac:  LVAD Hum   Lungs:  CTAB Abdomen:  Mild distention, no tender no HSM. No bruits or masses. +BS  LVAD exit site: Well-healed and incorporated. Dressing dry and intact. No erythema or drainage. Stabilization device present and accurately applied. Driveline dressing changed daily per sterile technique. Extremities: no cyanosis, clubbing, rash, edema Neuro: alert & oriented x 3, cranial nerves grossly intact. moves all 4 extremities w/o difficulty. Affect pleasant  Telemetry   NSR 90s, 7 beat run of NSVT  personally reviewed.    EKG   N/A  Labs    Basic Metabolic Panel: Recent Labs  Lab 04/27/19 0241 04/28/19 0716 04/29/19 0019 04/30/19 0246 05/01/19 0500  NA 135 137 136 134* 136  K 4.1 4.2 4.3 4.3 4.6  CL 107 104 104 102 102  CO2 19* 22 21* 23 24  GLUCOSE 106* 121* 188* 125* 104*  BUN 16 17 16 18 18   CREATININE 1.43* 1.49* 1.46* 1.38* 1.64*  CALCIUM 9.0 9.2 9.2 9.1 9.3  MG  --  1.8  --   --   --     Liver Function Tests: No results for input(s): AST, ALT, ALKPHOS, BILITOT, PROT, ALBUMIN in the last 168 hours. No results for input(s): LIPASE, AMYLASE in the last 168 hours. No results for input(s): AMMONIA in the last 168 hours.  CBC: Recent Labs  Lab 04/26/19 0220 04/27/19 0241 04/28/19 0716 04/29/19 0019 05/01/19 0500  WBC 12.4* 13.1* 12.3* 11.7* 8.2  HGB 9.7* 9.8* 10.0* 9.9* 10.3*  HCT 32.7* 32.6* 32.9* 33.4* 34.7*  MCV 92.6 90.6 90.4 90.8 92.0  PLT 224 218 222 229 192    Cardiac Enzymes: No results for input(s): CKTOTAL, CKMB, CKMBINDEX, TROPONINI in the last 168 hours.  BNP: BNP (last 3 results) Recent Labs    03/21/19 1415 04/23/19 1350  BNP 426.9* 709.6*    ProBNP (last 3 results) No results for input(s): PROBNP in the last 8760 hours.   CBG: No results for input(s): GLUCAP in the last 168 hours.  Coagulation  Studies: Recent Labs    04/29/19 0548 04/30/19 0246 05/01/19 0500  LABPROT 16.8* 17.1* 20.8*  INR 1.4* 1.4* 1.8*      Imaging    No results found.     Assessment/Plan:    1. LVAD Complication, Driveline infection/ Abdominal wall cellulitis: - CT abdomen- Ill-defined infiltrative changes surrounding the line which enters the anterior upper abdominal wall most likely representing infection.  - Blood Cultures- NGTD  - Wound Culture- staph  Aureus.(MSSA) - Improving w/ IV abx. Afebrile. WBC ct down to 8.2 today - ID has seen. They recommend no PICC line. Discontinue Cefazolin  at discharge and start doxycycline 100 mg bid through 12/25.  - LDH 2010. INR 1.8. Can stop Heparin.   2. 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  - Volume status improving. Wt down additional 2lb. Continue torsemide 20 mg daily. SCr up slightly from 1.38>>1.64. No PI events on VAD interrogation. Will need repeat BMP at clinic f/u.  - Continue hydralazine 50 mg three times a day.  - We tried entresto in the past but he developed AKI. -Continue sildenafil for RV failure. No bb or imdur for that reason.    - INR 1.8. Stop Heparin. Continue Coumadin. PharmD to assist w/ d/c dose.  - VAD interrogated personally. Parameters stable.  3. HTN - MAP stable in 80s. Continue current meds.   4. Anemia - Hgb stable this admit. 10.3 today  5. AKI: SCr up from 1.38>>1.64. BUN normal at 18. MAPs 80s. - needs repeat BMP at clinic f/u.    Length of Stay: [address], [place]  9:23 AM   05/01/2019, 9:23 AM  VAD Team Pager [phone] (7am - 7am) +++VAD ISSUES ONLY+++   Advanced Heart Failure Team Pager [phone] (M-F; 7a - 4p)  Please contact [facility] for night-coverage after hours (4p -7a ) and weekends on [email or web address] for all non- LVAD Issues  Patient seen and examined with the above-signed Advanced Practice Provider and/or Housestaff. I personally reviewed laboratory  data, imaging studies and relevant notes. I independently examined the patient and formulated the important aspects of the plan. I have edited the note to reflect any of my changes or salient points. I have personally discussed the plan with the patient and/or family.  He is improved today. Volume status ok. Driveline infection has improved. INR 1.8. VAD interrogated personally. Parameters stable.  D/w ID will switch IV cefazolin to doxy. Can pull PICC.   Arrange f/u in [facility].   [name], MD  2:13 PM

## 2019-05-01 NOTE — TOC Transition Note (Signed)
Transition of Care [facility]) - CM/SW Discharge Note   Patient Details  Name: Jonathan Wood MRN: [id number] Date of Birth: March 07, 1971  [facility] CM/SW Contact:  [name], RN Phone Number: 05/01/2019, 12:31 PM   Clinical Narrative:    Patient is for dc today, per Clarise Cruz , [place] coordinator he will not need iv abx , he will be switched to po abx.  Clarise Cruz will educate the daughter on how to do the daily dry dressing for his lvad site prior to dc. NCM informed Butch Penny with [facility] they will not need a HHRN for patient.    Final next level of care: Home/Self Care Barriers to Discharge: No Barriers Identified   Patient Goals and CMS Choice Patient states their goals for this hospitalization and ongoing recovery are:: go home CMS Medicare.gov Compare Post Acute Care list provided to:: Patient Choice offered to / list presented to : Patient  Discharge Placement                       Discharge Plan and Services   Discharge Planning Services: CM Consult Post Acute Care Choice: Home Health          DME Arranged: (NA)         HH Arranged: NA [place] Agency: [place] ([place]) Date [place]: 04/30/19 Time [place]: 1640 Representative spoke with at [place]: [place] ([place]) Interventions     Readmission Risk Interventions No flowsheet data found.

## 2019-05-01 NOTE — Progress Notes (Signed)
Patient places himself on CPAP and will call if any assistance needed. 

## 2019-05-01 NOTE — Discharge Summary (Addendum)
Advanced Heart Failure Team  Discharge Summary   Patient ID: Jonathan Wood MRN: [id number], DOB/AGE: 1971-03-29 48 y.o. 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  Admitted 08/21/2018 with volume overload and anemia. Diuresed with IV lasix and transitioned to torsemide 20 mg daily. GI consulted. EGD completed and showed gastritis and nonbleeding duodenal ulcers, colonoscopy with 5 polyps removed. Protonix increased to BID, he was started on PO iron,and hydrocortisone suppositories for hemorrhoid treatment. He received a total of 2 uPRBCs.  On 12/4 he was seen for a dressing change and there was moderate exudate. He was supposed to start doxycyline but never picked up prescriptions.    He presented back for follow up on 04/23/19 complaining of abdominal pain. Increased drainage/exudate noted on driveline w/ increased ordor. There was evidence of overlying cellulitis. He had not had a dressing change since 04/20/19. Also complained of mild SOB with exertion. Denied orthopnea. Denied BRBPR. Appetite had been fair.    He was directly admitted from [place] clinic to [facility] for drive line infection. Coumadin was held on admit for possible debridement. WBC ct was 17.2 on admit. Abdominal CT showed changes consistent with primarily superficial infection into fat without clear evidence of abscess. Cultures were obtained and he was started on broad spectrum abx, vanc and cefepime. Wound Culture grew Staph Aureus (MSSA). Blood Cultures no growth. Abx later  changed to Ancef. He had clinical improvement w/ abx. WBC ct improved down to 8.2 and he remained afebrile. He completed a total of 8 days of IV abx therapy. ID was consulted on 12/15 and recommended continuation w/ PO abx at discharge, doxycycline 100 mg bid through 12/25.  During hospitalization, he also required IV Lasix for volume overload (BNP 709). Improved w/ IV Lasix and was transitioned back to PO diuretics, torsemide 20 mg daily.  Volume remained stable. Slight bump in SCr from 1.3>>1.6 (needs repeat BMP at clinic f/u). Hydralazine was increased to 50 tid for better BP control.   Once determined he would not require any intervention/ procedures, coumadin was resumed w/ heparin bridge. INR 1.8 day of d/c. H/H remained stable. No bleeding. Pharmacy recommended he be discharged on 5 mg coumadin nighty until clinic f/u on 12/22.   LVAD function/ parameters remained stable through admit.   On 12/15, he was seen and examined by Dr. Haroldine Wood and felt stable for d/c home. He has f/u in VAD clinic scheduled for 12/22. His Rx for doxycycline was filled and delivered by [facility] pharmacy prior to d/c.    Discharge Weight Range: 238 lb  Discharge Vitals: Blood pressure 94/73, pulse 90, temperature 97.6 F (36.4 C), temperature source Oral, resp. rate 13, height 6\' 2"  (1.88 m), weight 108.1 kg, SpO2 97 %.  Labs: Lab Results  Component Value Date   WBC 8.2 05/01/2019   HGB 10.3 (L) 05/01/2019   HCT 34.7 (L) 05/01/2019   MCV 92.0 05/01/2019   PLT 192 05/01/2019    Recent Labs  Lab 05/01/19 0500  NA 136  K 4.6  CL 102  CO2 24  BUN 18  CREATININE 1.64*  CALCIUM 9.3  GLUCOSE 104*   Lab Results  Component Value Date   CHOL 111 08/30/2017   HDL 33 (L) 08/30/2017   LDLCALC 70 08/30/2017   TRIG 38 08/30/2017   BNP (last 3 results) Recent Labs    03/21/19 1415 04/23/19 1350  BNP 426.9* 709.6*    ProBNP (last 3 results) No results for input(s): PROBNP in the last 8760  hours.   Diagnostic Studies/Procedures   No results found.  Discharge Medications   Allergies as of 05/01/2019   No Known Allergies     Medication List    STOP taking these medications   doxycycline 100 MG tablet Commonly known as: ADOXA     TAKE these medications   amiodarone 200 MG tablet Commonly known as: PACERONE Take 1 tablet (200 mg total) by mouth daily.   citalopram 10 MG tablet Commonly known as: CELEXA Take 1 tablet (10 mg total) by mouth daily.   docusate sodium 100 MG capsule Commonly known as: Colace Take 1 capsule (100 mg total) by mouth 3 (three) times daily.   doxycycline 100 MG tablet Commonly known as: VIBRA-TABS Take 1 tablet (100 mg total) by mouth 2 (two) times daily for 10 days.   ferrous sulfate 325 (65 FE) MG tablet Take 325 mg by mouth daily with breakfast.   gabapentin 300 MG capsule Commonly known as: NEURONTIN Take 1 capsule (300 mg total) by mouth 3 (three) times daily.   hydrALAZINE 50 MG tablet Commonly known as: APRESOLINE Take 1 tablet (50 mg total) by mouth 3 (three) times daily. What changed:   medication strength  how much to take  when to take this   hydrOXYzine 25 MG tablet Commonly known as: ATARAX/VISTARIL Take 25 mg by mouth every 8 (eight) hours as needed for anxiety.   linaclotide 145 MCG Caps capsule Commonly known as: Linzess Take 1 capsule (145 mcg total) by mouth daily before breakfast. What changed: when to take this   pantoprazole 40 MG tablet Commonly known as: PROTONIX Take 1 tablet (40 mg total) by mouth 2 (two) times daily. What changed: when to take this   potassium chloride SA 20 MEQ tablet Commonly known as: Klor-Con M20 Take 1 tablet (20 mEq total) by mouth daily.   sildenafil 20 MG tablet Commonly known as: REVATIO Take 1 tablet (20 mg total) by mouth 3 (three) times daily.   torsemide 20 MG tablet Commonly known as: DEMADEX TAKE 1 TABLET BY MOUTH DAILY FOR 30 DAYS What changed:  See the new instructions.   warfarin 5 MG tablet Commonly known as: COUMADIN Take as directed. If you are unsure how to take this medication, talk to your nurse or doctor. Original instructions: Take 1 tablet by mouth daily until seen in clinic 12/22 then as directed by clinic pharmacist. What changed: See the new instructions.       Disposition   The patient will be discharged in stable condition to home.  Follow-up Information    [facility] Follow up.   Why: HHRN   [phone]       [facility] Follow up on 05/08/2019.   Specialty: Cardiology Why: V[facility] Clinic 9:00 AM Garage Parking Code [id number]  Contact information: [address] [id number] [place] [address] [phone]            Duration of Discharge Encounter: Greater than 35 minutes   Signed,  Robbie Lis PA-C 05/01/2019, 10:39 AM  Patient seen and examined with the above-signed Advanced Practice Provider and/or Housestaff. I personally reviewed laboratory data, imaging studies and relevant notes. I independently examined the patient and formulated the important aspects of the plan. I have edited the note to reflect any of my changes or salient points. I have personally discussed the plan with the patient and/or family.  He is improved today. Volume status ok. Driveline infection has improved. INR 1.8. VAD interrogated personally. Parameters stable.  D/w ID will switch IV cefazolin to doxy. Can pull PICC.   Arrange f/u in VAD Clinic.   Arvilla Meres, MD  2:13 PM

## 2019-05-01 NOTE — Progress Notes (Signed)
ANTICOAGULATION CONSULT NOTE - Follow-Up Consult  Pharmacy Consult for warfarin Indication: LVAD  No Known Allergies  Patient Measurements: Height: 6\' 2"  (188 cm) Weight: 238 lb 5.1 oz (108.1 kg) IBW/kg (Calculated) : 82.2 Heparin Dosing Weight: 100kg  Vital Signs: Temp: 97.6 F (36.4 C) (12/15 0749) Temp Source: Oral (12/15 0749) BP: 94/73 (12/15 0858) Pulse Rate: 90 (12/15 0749)  Labs: Recent Labs    04/29/19 0019 04/29/19 0548 04/30/19 0246 05/01/19 0500  HGB 9.9*  --   --  10.3*  HCT 33.4*  --   --  34.7*  PLT 229  --   --  192  LABPROT  --  16.8* 17.1* 20.8*  INR  --  1.4* 1.4* 1.8*  HEPARINUNFRC  --  0.45 0.32 0.24*  CREATININE 1.46*  --  1.38* 1.64*    Estimated Creatinine Clearance: 73.7 mL/min (A) (by C-G formula based on SCr of 1.64 mg/dL (H)).   Medical History: Past Medical History:  Diagnosis Date  . Asthma   . CHF (congestive heart failure) ([place])    a. 09/2016: EF 20-25% with cath showing normal cors  . GERD (gastroesophageal reflux disease)   . History of hiatal hernia   . OSA on CPAP 09/06/2018   Severe OSA with AHI 68/hr on CPAP at 12cm H2O    Assessment: 89 yoM with LVAD on warfarin PTA admitted with DLI. Warfarin initially held while deciding on I&D - none planned, warfarin resumed 12/9, heparin bridge continues.  INR today is up to 1.8. LDH stable Heparin level slightly below goal but INR up so will stop heparin altogether.  PICC placed last night, will remove as plan for po abx at d/c now.   **PTA Warfarin dose = 7.5mg  MW, 5mg  AODs  Goal of Therapy:  INR goal 2-2.5 Heparin level 0.3-0.5 units/ml Monitor platelets by anticoagulation protocol: Yes   Plan:  -Warfarin dosing at d/c - 5mg  daily until seen in clinic next week 12/22 -Stop heparin  Erin Hearing PharmD., BCPS Clinical Pharmacist 05/01/2019 9:42 AM

## 2019-05-07 ENCOUNTER — Other Ambulatory Visit (HOSPITAL_COMMUNITY): Payer: Self-pay | Admitting: Unknown Physician Specialty

## 2019-05-07 DIAGNOSIS — Z7901 Long term (current) use of anticoagulants: Secondary | ICD-10-CM

## 2019-05-07 DIAGNOSIS — Z95811 Presence of heart assist device: Secondary | ICD-10-CM

## 2019-05-08 ENCOUNTER — Other Ambulatory Visit: Payer: Self-pay

## 2019-05-08 ENCOUNTER — Ambulatory Visit (HOSPITAL_COMMUNITY): Payer: Self-pay | Admitting: Pharmacist

## 2019-05-08 ENCOUNTER — Ambulatory Visit (HOSPITAL_COMMUNITY)
Admit: 2019-05-08 | Discharge: 2019-05-08 | Disposition: A | Discharge status: Home / Self Care | Payer: Medicaid Other | Source: Ambulatory Visit | Attending: Internal Medicine | Admitting: Internal Medicine

## 2019-05-08 VITALS — BP 104/0 | HR 85 | Ht 74.0 in | Wt 250.6 lb

## 2019-05-08 DIAGNOSIS — Z9111 Patient's noncompliance with dietary regimen: Secondary | ICD-10-CM | POA: Diagnosis not present

## 2019-05-08 DIAGNOSIS — Z95811 Presence of heart assist device: Secondary | ICD-10-CM

## 2019-05-08 DIAGNOSIS — Z7901 Long term (current) use of anticoagulants: Secondary | ICD-10-CM | POA: Insufficient documentation

## 2019-05-08 DIAGNOSIS — Z720 Tobacco use: Secondary | ICD-10-CM

## 2019-05-08 DIAGNOSIS — I428 Other cardiomyopathies: Secondary | ICD-10-CM | POA: Diagnosis not present

## 2019-05-08 DIAGNOSIS — F17210 Nicotine dependence, cigarettes, uncomplicated: Secondary | ICD-10-CM | POA: Diagnosis not present

## 2019-05-08 DIAGNOSIS — Z86010 Personal history of colonic polyps: Secondary | ICD-10-CM | POA: Diagnosis not present

## 2019-05-08 DIAGNOSIS — Z8711 Personal history of peptic ulcer disease: Secondary | ICD-10-CM | POA: Insufficient documentation

## 2019-05-08 DIAGNOSIS — T827XXA Infection and inflammatory reaction due to other cardiac and vascular devices, implants and grafts, initial encounter: Secondary | ICD-10-CM

## 2019-05-08 DIAGNOSIS — Z79899 Other long term (current) drug therapy: Secondary | ICD-10-CM | POA: Insufficient documentation

## 2019-05-08 DIAGNOSIS — I110 Hypertensive heart disease with heart failure: Secondary | ICD-10-CM | POA: Diagnosis not present

## 2019-05-08 DIAGNOSIS — I5022 Chronic systolic (congestive) heart failure: Secondary | ICD-10-CM | POA: Insufficient documentation

## 2019-05-08 DIAGNOSIS — F419 Anxiety disorder, unspecified: Secondary | ICD-10-CM | POA: Insufficient documentation

## 2019-05-08 DIAGNOSIS — K219 Gastro-esophageal reflux disease without esophagitis: Secondary | ICD-10-CM | POA: Insufficient documentation

## 2019-05-08 DIAGNOSIS — Z8249 Family history of ischemic heart disease and other diseases of the circulatory system: Secondary | ICD-10-CM | POA: Insufficient documentation

## 2019-05-08 DIAGNOSIS — G4733 Obstructive sleep apnea (adult) (pediatric): Secondary | ICD-10-CM | POA: Diagnosis not present

## 2019-05-08 DIAGNOSIS — E876 Hypokalemia: Secondary | ICD-10-CM | POA: Insufficient documentation

## 2019-05-08 LAB — CBC
HCT: 35.8 % — ABNORMAL LOW (ref 39.0–52.0)
Hemoglobin: 10.5 g/dL — ABNORMAL LOW (ref 13.0–17.0)
MCH: 27.8 pg (ref 26.0–34.0)
MCHC: 29.3 g/dL — ABNORMAL LOW (ref 30.0–36.0)
MCV: 94.7 fL (ref 80.0–100.0)
Platelets: 173 10*3/uL (ref 150–400)
RBC: 3.78 MIL/uL — ABNORMAL LOW (ref 4.22–5.81)
RDW: 20.5 % — ABNORMAL HIGH (ref 11.5–15.5)
WBC: 5.4 10*3/uL (ref 4.0–10.5)
nRBC: 0 % (ref 0.0–0.2)

## 2019-05-08 LAB — BASIC METABOLIC PANEL
Anion gap: 12 (ref 5–15)
BUN: 11 mg/dL (ref 6–20)
CO2: 21 mmol/L — ABNORMAL LOW (ref 22–32)
Calcium: 8.9 mg/dL (ref 8.9–10.3)
Chloride: 105 mmol/L (ref 98–111)
Creatinine, Ser: 1.32 mg/dL — ABNORMAL HIGH (ref 0.61–1.24)
GFR calc Af Amer: >60 mL/min (ref >60)
GFR calc non Af Amer: >60 mL/min (ref >60)
Glucose, Bld: 181 mg/dL — ABNORMAL HIGH (ref 70–99)
Potassium: 4.1 mmol/L (ref 3.5–5.1)
Sodium: 138 mmol/L (ref 135–145)

## 2019-05-08 LAB — LACTATE DEHYDROGENASE: LDH: 219 U/L — ABNORMAL HIGH (ref 98–192)

## 2019-05-08 LAB — PROTIME-INR
INR: 1.3 — ABNORMAL HIGH (ref 0.8–1.2)
Prothrombin Time: 16 seconds — ABNORMAL HIGH (ref 11.4–15.2)

## 2019-05-08 MED ORDER — WARFARIN SODIUM 5 MG PO TABS: ORAL_TABLET | ORAL | 3 refills | Status: DC

## 2019-05-08 NOTE — Patient Instructions (Addendum)
1. Start Lovenox shots twice a day for 2 days. Take 2 tabs of Coumadin (10mg ) tonight and tomorrow night. Resume a dose of 7.5 mg (1 tab and half) on Monday and Wednesday. 5 mg all other days. 2. Recheck INR on Monday 12/28 at [facility]. 3. Return to clinic in 1 month

## 2019-05-08 NOTE — Progress Notes (Signed)
LVAD INR 

## 2019-05-08 NOTE — Progress Notes (Signed)
Patient presents for hospital in [place] clinic today. Denies any issues with his VAD or alarms.   Pt is SOB and states that "he is having trouble pooping". Pt lost his Linzess and the pharmacy will not fill it until 12/31. Pt is up 12lbs today since hospital discharge. Pt states that he thinks his weight is up because he needs to poop.   Vital Signs:   Doppler Pressure: 104 Automatc BP: 97/73 (82) HR:  85 SPO2: 97  Weight: 250.6  lbs w/o eqt Last weight: 238 lb  VAD Indication: Destination Therapy   VAD interrogation & Equipment Management: Speed: 5600 Flow: 4.5  Power: 4.2w    PI: 4.6 Hct: 35 Alarms: multiple low voltage advisories Events: rare  Fixed speed 5600 Low speed limit: 5300  Primary Controller:  Replace back up battery in 22 months.  Back up controller:   Replace back up battery in 15 months.  Annual Equipment Maintenance on UBC/PM was performed on 4/19.   I reviewed the LVAD parameters from today and compared the results to the patient's prior recorded data. LVAD interrogation was NEGATIVE for significant power changes, NEGATIVE for clinical alarms and STABLE for PI events/speed drops. No programming changes were made and pump is functioning within specified parameters. Pt is performing daily controller and system monitor self tests along with completing weekly and monthly maintenance for LVAD equipment.  LVAD equipment check completed and is in good working order. Back-up equipment present.   Exit Site Care: Drive line is being maintained weekly by patient.  Current dressing is falling off and drive line is bent at an extreme angle from exit site. Discussed importance of drive line positioning for tissue ingrowth and to prevent lead fractures. Existing VAD dressing removed and site care performed using sterile technique. Drive line exit site cleaned with Chlora prep applicators x 2, allowed to dry, and sorbaview dressing applied with bio patch re-applied. Drive  line exit site incorporated, the velour is fully implanted at exit site. Scant dried yellow drainage. No erythema, tenderness, or foul odor noted.  Pt denies fever or chills. Pts daughter has been doing every other day dressing changes. Pt informed to continue every other day dressing changes.   Significant Events on VAD Support:   Device:none  BP & Labs:  Doppler 104 - reflecting modified systolic  Hgb 32.4 - No S/S of bleeding. Specifically denies melena/BRBPR or nosebleeds.  LDH 219 stable at  with established baseline of 200-300. Denies tea-colored urine. No power elevations noted on interrogation.   Patient Instructions:  1. Start Lovenox shots twice a day for 2 days. Take 2 tabs of Coumadin (10mg ) tonight and tomorrow night. Resume a dose of 7.5 mg (1 tab and half) on Monday and Wednesday. 5 mg all other days. 2. Recheck INR on Monday 12/28 at [facility]. 3. Return to clinic in 1 month  Lattimore Conyngham Coordinator  Office: [phone]  24/7 Pager: [phone]

## 2019-05-08 NOTE — Progress Notes (Addendum)
VAD Clinic Note   Date:  05/08/2019   ID:  Everardo, Voris [birth year], MRN [id number]  Location: Home  Provider location: [place] [facility] Failure Type of Visit: Established patient  PCP: [name]  Cardiologist:  Arvilla Meres, MD Primary HF: Dr [name]   Chief Complaint: Heart Failure/LVAD  History of Present Illness: Jonathan Wood is a 48 y.o. 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  Admitted 08/21/2018 with volume overload and anemia. Diuresed with IV lasix and transitioned to torsemide 20 mg daily. GI consulted EGD completed and showed gastritis and nonbleeding duodenal ulcers, colonoscopy with 5 polyps removed. Protonix increased to BID, he was started on PO iron, and hydrocortisone suppositories for hemorrhoid treatment. He received a total of 2 uPRBCs. He will stay off ASA for several weeks but can reconsider later.  Discharge weight was 221 pounds.   Admitted 12/7-15/20 for DL infection with overlying cellulitis and volume overload. Treated with IV abx and diuresis. Remains on oral doxy. Feels much better. Ab bloating improved. DL site look good. No fevers or chills. Denies orthopnea or PND.  No bleeding, melena or neuro symptoms. No VAD alarms. Taking all meds as prescribed.   VAD Indication: Destination Therapy   VAD interrogation & Equipment Management: Speed: 5600 Flow: 4.5  Power: 4.2w PI: 4.6 Hct: 35 Alarms: multiple low voltage advisories Events: rare  Fixed speed 5600 Low speed limit: 5300  Primary Controller: Replace back up battery in 22 months.  Back up controller: Replace back up battery in 15 months.  Annual Equipment Maintenance on UBC/PM was performed on 4/19.   Past Medical History:  Diagnosis Date  . Asthma   . CHF (congestive heart failure) ([facility])    a. 09/2016: EF 20-25% with cath showing normal cors    . GERD (gastroesophageal reflux disease)   . History of hiatal hernia   . OSA on CPAP 09/06/2018   Severe OSA with AHI 68/hr on CPAP at 12cm H2O   Past Surgical History:  Procedure Laterality Date  . BIOPSY  08/23/2018   Procedure: BIOPSY;  Surgeon: Meridee Score Netty Starring., MD;  Location: [facility] ENDOSCOPY;  Service: Gastroenterology;;  . COLONOSCOPY N/A 08/23/2018   Procedure: COLONOSCOPY;  Surgeon: Lemar Lofty., MD;  Location: [facility] ENDOSCOPY;  Service: Gastroenterology;  Laterality: N/A;  . ENTEROSCOPY N/A 08/23/2018   Procedure: ENTEROSCOPY;  Surgeon: Meridee Score Netty Starring., MD;  Location: [facility] ENDOSCOPY;  Service: Gastroenterology;  Laterality: N/A;  . HEMOSTASIS CLIP PLACEMENT  08/23/2018   Procedure: HEMOSTASIS CLIP PLACEMENT;  Surgeon: Lemar Lofty., MD;  Location: [facility] ENDOSCOPY;  Service: Gastroenterology;;  . IABP INSERTION N/A 09/05/2017   Procedure: IABP INSERTION;  Surgeon: Dolores Patty, MD;  Location: [facility] INVASIVE CV LAB;  Service: Cardiovascular;  Laterality: N/A;  . INSERTION OF IMPLANTABLE LEFT VENTRICULAR ASSIST DEVICE N/A 09/06/2017   Procedure: INSERTION OF IMPLANTABLE LEFT VENTRICULAR ASSIST DEVICE - HM3;  Surgeon: Kerin Perna, MD;  Location: [facility] OR;  Service: Open Heart Surgery;  Laterality: N/A;  HM3  . NASAL FRACTURE SURGERY  1987  . POLYPECTOMY  08/23/2018   Procedure: POLYPECTOMY;  Surgeon: Mansouraty, Netty Starring., MD;  Location: [facility] ENDOSCOPY;  Service: Gastroenterology;;  . RIGHT HEART CATH N/A 08/30/2017   Procedure: RIGHT HEART CATH;  Surgeon: Dolores Patty, MD;  Location: [facility] INVASIVE CV LAB;  Service: Cardiovascular;  Laterality: N/A;  .  RIGHT/LEFT HEART CATH AND CORONARY ANGIOGRAPHY N/A 10/08/2016   Procedure: Right/Left Heart Cath and Coronary Angiography;  Surgeon: Dixie Dials, MD;  Location: [place] CV LAB;  Service: Cardiovascular;  Laterality: N/A;  . SUBMUCOSAL TATTOO INJECTION  08/23/2018   Procedure: SUBMUCOSAL TATTOO INJECTION;   Surgeon: Irving Copas., MD;  Location: [place];  Service: Gastroenterology;;  . TEE WITHOUT CARDIOVERSION N/A 09/06/2017   Procedure: TRANSESOPHAGEAL ECHOCARDIOGRAM (TEE);  Surgeon: Prescott Gum, Collier Salina, MD;  Location: [place];  Service: Open Heart Surgery;  Laterality: N/A;     Current Outpatient Medications  Medication Sig Dispense Refill  . amiodarone (PACERONE) 200 MG tablet Take 1 tablet (200 mg total) by mouth daily. 90 tablet 3  . citalopram (CELEXA) 10 MG tablet Take 1 tablet (10 mg total) by mouth daily. 90 tablet 5  . docusate sodium (COLACE) 100 MG capsule Take 1 capsule (100 mg total) by mouth 3 (three) times daily. 90 capsule 2  . doxycycline (VIBRA-TABS) 100 MG tablet Take 1 tablet (100 mg total) by mouth 2 (two) times daily for 10 days. 20 tablet 0  . gabapentin (NEURONTIN) 300 MG capsule Take 1 capsule (300 mg total) by mouth 3 (three) times daily. 90 capsule 6  . hydrALAZINE (APRESOLINE) 50 MG tablet Take 1 tablet (50 mg total) by mouth 3 (three) times daily. 90 tablet 3  . hydrOXYzine (ATARAX/VISTARIL) 25 MG tablet Take 25 mg by mouth every 8 (eight) hours as needed for anxiety.    . potassium chloride SA (KLOR-CON M20) 20 MEQ tablet Take 1 tablet (20 mEq total) by mouth daily. 30 tablet 3  . sildenafil (REVATIO) 20 MG tablet Take 1 tablet (20 mg total) by mouth 3 (three) times daily. 90 tablet 5  . torsemide (DEMADEX) 20 MG tablet TAKE 1 TABLET BY MOUTH DAILY FOR 30 DAYS (Patient taking differently: Take 20 mg by mouth every other day. ) 30 tablet 3  . warfarin (COUMADIN) 5 MG tablet Take 1 tablet by mouth daily until seen in clinic 12/22 then as directed by clinic pharmacist. 180 tablet 3  . ferrous sulfate 325 (65 FE) MG tablet Take 325 mg by mouth daily with breakfast.    . linaclotide (LINZESS) 145 MCG CAPS capsule Take 1 capsule (145 mcg total) by mouth daily before breakfast. (Patient not taking: Reported on 05/08/2019) 30 capsule 6  . pantoprazole (PROTONIX) 40  MG tablet Take 1 tablet (40 mg total) by mouth 2 (two) times daily. (Patient not taking: Reported on 05/08/2019) 60 tablet 6   No current facility-administered medications for this encounter.    Allergies:   Patient has no known allergies.   Social History:  The patient  reports that he has been smoking cigarettes. He has a 12.50 pack-year smoking history. He has never used smokeless tobacco. He reports current alcohol use. He reports that he does not use drugs.   Family History:  The patient's family history includes Hypertension in his father.   ROS:  Please see the history of present illness.   All other systems are personally reviewed and negative.   Vital Signs:   Doppler Pressure: 104 Automatc BP: 97/73 (82) HR:  85 SPO2: 97  Weight: 250.6  lbs w/o eqt Last weight: 238 lb  Exam:   General:  NAD.  HEENT: normal  Neck: supple. JVP not elevated.  Carotids 2+ bilat; no bruits. No lymphadenopathy or thryomegaly appreciated. Cor: LVAD hum.  Lungs: Clear. Abdomen: obese soft, nontender, non-distended. No hepatosplenomegaly. No  bruits or masses. Good bowel sounds. Driveline site clean. Anchor in place.  Extremities: no cyanosis, clubbing, rash. Warm no edema  Neuro: alert & oriented x 3. No focal deficits. Moves all 4 without problem    Recent Labs: 03/21/2019: TSH 1.111 04/23/2019: ALT 21; B Natriuretic Peptide 709.6 04/28/2019: Magnesium 1.8 05/08/2019: BUN 11; Creatinine, Ser 1.32; Hemoglobin 10.5; Platelets 173; Potassium 4.1; Sodium 138    Wt Readings from Last 3 Encounters:  05/08/19 113.7 kg (250 lb 9.6 oz)  05/01/19 108.1 kg (238 lb 5.1 oz)  04/23/19 110.4 kg (243 lb 6.4 oz)      ASSESSMENT AND PLAN:  1. Chronic systolic HF - EF 03% s/p HM-3 LVAD on 09/06/17 - Volume status much improved with diuresis. Suspect most of ab distension was obesity though - NYHA II - MAPs ok - Noncompliance with diet, meds and VAD care remains a major issue and I addressed it  again today.    2. HM3 LVAD 09/06/2017.  - VAD interrogated personally. Parameters stable. - LDH 219 - INR 1.3 goal 2.0-2.5 Discussed dosing with PharmD personally. Will start lovenox - Off ASA with PUD.  - Stressed need for better complaince  3. DL infection - much improved - continue doxy 100 id x 5 more days.  - counseled on need to avoid DL trauma  4. Hypokalemia - K 4.1 today. Will supp   5. Anxiety - Continue Celexa.    - Ok to use trazadone for sleep  6. Essential HTN - MAPs ok - Had AKI with Entresto. Wil not restart currently.  6. OSA - sleep study with very severe OSA (AHI 69/hr) - continues to struggle with mask but will f/u with Dr. Mayford Knife - encouraged weight loss  7. H/o GI bleed  - 08/2018 EGD completed and showed gastritis and nonbleeding duodenal ulcers, colonoscopy with 5 polyps removed. Protonix was increased to BID.  - hgb 10.9 -> 10.5 - Recently received Feraheme - He will stay off aspirin for now.   8. Tobacco Abuse  - Still smoking a few cigarettes per day. Discussed need for complete cessation  .   Signed, Arvilla Meres, MD  05/08/2019 11:34 AM  [facility] [address] Heart and Vascular [place] [address] [phone] (office) [phone] (fax)

## 2019-05-08 NOTE — Addendum Note (Signed)
Encounter addended by: Jolaine Artist, MD on: 05/08/2019 11:42 AM  Actions taken: Clinical Note Signed

## 2019-05-09 ENCOUNTER — Other Ambulatory Visit (HOSPITAL_COMMUNITY): Payer: Self-pay | Admitting: Unknown Physician Specialty

## 2019-05-09 DIAGNOSIS — Z7901 Long term (current) use of anticoagulants: Secondary | ICD-10-CM

## 2019-05-09 DIAGNOSIS — Z95811 Presence of heart assist device: Secondary | ICD-10-CM

## 2019-05-15 ENCOUNTER — Other Ambulatory Visit: Payer: Self-pay

## 2019-05-15 ENCOUNTER — Other Ambulatory Visit (HOSPITAL_COMMUNITY)
Admission: RE | Admit: 2019-05-15 | Discharge: 2019-05-15 | Disposition: A | Discharge status: Home / Self Care | Payer: Medicaid Other | Source: Ambulatory Visit | Attending: Internal Medicine | Admitting: Internal Medicine

## 2019-05-15 ENCOUNTER — Ambulatory Visit (HOSPITAL_COMMUNITY): Payer: Self-pay | Admitting: Pharmacist

## 2019-05-15 DIAGNOSIS — Z95811 Presence of heart assist device: Secondary | ICD-10-CM

## 2019-05-15 LAB — CBC
HCT: 40.1 % (ref 39.0–52.0)
Hemoglobin: 11.8 g/dL — ABNORMAL LOW (ref 13.0–17.0)
MCH: 28.2 pg (ref 26.0–34.0)
MCHC: 29.4 g/dL — ABNORMAL LOW (ref 30.0–36.0)
MCV: 95.7 fL (ref 80.0–100.0)
Platelets: 160 10*3/uL (ref 150–400)
RBC: 4.19 MIL/uL — ABNORMAL LOW (ref 4.22–5.81)
RDW: 20.7 % — ABNORMAL HIGH (ref 11.5–15.5)
WBC: 6 10*3/uL (ref 4.0–10.5)
nRBC: 0 % (ref 0.0–0.2)

## 2019-05-15 LAB — BASIC METABOLIC PANEL
Anion gap: 11 (ref 5–15)
BUN: 14 mg/dL (ref 6–20)
CO2: 20 mmol/L — ABNORMAL LOW (ref 22–32)
Calcium: 9 mg/dL (ref 8.9–10.3)
Chloride: 109 mmol/L (ref 98–111)
Creatinine, Ser: 1.26 mg/dL — ABNORMAL HIGH (ref 0.61–1.24)
GFR calc Af Amer: >60 mL/min (ref >60)
GFR calc non Af Amer: >60 mL/min (ref >60)
Glucose, Bld: 125 mg/dL — ABNORMAL HIGH (ref 70–99)
Potassium: 3.8 mmol/L (ref 3.5–5.1)
Sodium: 140 mmol/L (ref 135–145)

## 2019-05-15 LAB — LACTATE DEHYDROGENASE: LDH: 242 U/L — ABNORMAL HIGH (ref 98–192)

## 2019-05-15 LAB — PROTIME-INR
INR: 1.8 — ABNORMAL HIGH (ref 0.8–1.2)
Prothrombin Time: 20.7 seconds — ABNORMAL HIGH (ref 11.4–15.2)

## 2019-05-15 NOTE — Progress Notes (Signed)
LVAD INR 

## 2019-05-16 ENCOUNTER — Other Ambulatory Visit (HOSPITAL_COMMUNITY): Payer: Self-pay | Admitting: *Deleted

## 2019-05-16 DIAGNOSIS — Z95811 Presence of heart assist device: Secondary | ICD-10-CM

## 2019-05-16 DIAGNOSIS — Z7901 Long term (current) use of anticoagulants: Secondary | ICD-10-CM

## 2019-06-06 ENCOUNTER — Other Ambulatory Visit: Payer: Self-pay

## 2019-06-06 ENCOUNTER — Other Ambulatory Visit (HOSPITAL_COMMUNITY)
Admission: RE | Admit: 2019-06-06 | Discharge: 2019-06-06 | Disposition: A | Discharge status: Home / Self Care | Payer: Medicaid Other | Source: Ambulatory Visit | Attending: Internal Medicine | Admitting: Internal Medicine

## 2019-06-06 DIAGNOSIS — Z95811 Presence of heart assist device: Secondary | ICD-10-CM | POA: Insufficient documentation

## 2019-06-06 LAB — BASIC METABOLIC PANEL
Anion gap: 10 (ref 5–15)
BUN: 10 mg/dL (ref 6–20)
CO2: 22 mmol/L (ref 22–32)
Calcium: 8.8 mg/dL — ABNORMAL LOW (ref 8.9–10.3)
Chloride: 104 mmol/L (ref 98–111)
Creatinine, Ser: 1.2 mg/dL (ref 0.61–1.24)
GFR calc Af Amer: >60 mL/min (ref >60)
GFR calc non Af Amer: >60 mL/min (ref >60)
Glucose, Bld: 178 mg/dL — ABNORMAL HIGH (ref 70–99)
Potassium: 3.5 mmol/L (ref 3.5–5.1)
Sodium: 136 mmol/L (ref 135–145)

## 2019-06-06 LAB — CBC
HCT: 43 % (ref 39.0–52.0)
Hemoglobin: 13 g/dL (ref 13.0–17.0)
MCH: 28.6 pg (ref 26.0–34.0)
MCHC: 30.2 g/dL (ref 30.0–36.0)
MCV: 94.7 fL (ref 80.0–100.0)
Platelets: 165 10*3/uL (ref 150–400)
RBC: 4.54 MIL/uL (ref 4.22–5.81)
RDW: 18.8 % — ABNORMAL HIGH (ref 11.5–15.5)
WBC: 5.3 10*3/uL (ref 4.0–10.5)
nRBC: 0 % (ref 0.0–0.2)

## 2019-06-06 LAB — LACTATE DEHYDROGENASE: LDH: 247 U/L — ABNORMAL HIGH (ref 98–192)

## 2019-06-07 ENCOUNTER — Telehealth (HOSPITAL_COMMUNITY): Payer: Self-pay | Admitting: Pharmacist

## 2019-06-07 ENCOUNTER — Other Ambulatory Visit (HOSPITAL_COMMUNITY): Payer: Self-pay | Admitting: Internal Medicine

## 2019-06-07 DIAGNOSIS — R52 Pain, unspecified: Secondary | ICD-10-CM

## 2019-06-07 DIAGNOSIS — G629 Polyneuropathy, unspecified: Secondary | ICD-10-CM

## 2019-06-07 NOTE — Telephone Encounter (Signed)
Called patient to let him know [facility] did not draw an INR when he went yesterday. It is recommended that he return there to have INR re-drawn as he has not had an INR drawn since 05/15/19 due to transportation issues. Patient did not answer and I am unable to leave VM. Patient is already scheduled for clinic appointment on Monday 06/11/19. Will obtain INR at that visit if he is unable to go sooner.  [name], PharmD, BCPS, BCCP, CPP Heart Failure Clinic Pharmacist [phone]

## 2019-06-08 ENCOUNTER — Telehealth: Payer: Self-pay | Admitting: Gastroenterology

## 2019-06-08 ENCOUNTER — Telehealth (HOSPITAL_COMMUNITY): Payer: Self-pay

## 2019-06-08 ENCOUNTER — Other Ambulatory Visit (HOSPITAL_COMMUNITY): Payer: Self-pay | Admitting: *Deleted

## 2019-06-08 DIAGNOSIS — Z95811 Presence of heart assist device: Secondary | ICD-10-CM

## 2019-06-08 DIAGNOSIS — Z7901 Long term (current) use of anticoagulants: Secondary | ICD-10-CM

## 2019-06-08 MED ORDER — LINACLOTIDE 145 MCG PO CAPS: 145.0000 ug | ORAL_CAPSULE | Freq: Every day | ORAL | 4 refills | Status: DC

## 2019-06-08 NOTE — Telephone Encounter (Signed)
Rx for Linzess sent to [facility].

## 2019-06-08 NOTE — Telephone Encounter (Signed)
Pt requested a refill for Linzess sent to [facility].

## 2019-06-08 NOTE — Telephone Encounter (Signed)
Received call from [facility] lab stating that they did not draw a pt/inr on pt when drawing the rest of his labs. Made Revonda Standard RN VAD coordinator aware.

## 2019-06-11 ENCOUNTER — Ambulatory Visit (HOSPITAL_COMMUNITY)
Admission: RE | Admit: 2019-06-11 | Discharge: 2019-06-11 | Disposition: A | Discharge status: Home / Self Care | Payer: Medicaid Other | Source: Ambulatory Visit | Attending: Internal Medicine | Admitting: Internal Medicine

## 2019-06-11 ENCOUNTER — Ambulatory Visit (HOSPITAL_COMMUNITY): Payer: Self-pay | Admitting: Pharmacist

## 2019-06-11 ENCOUNTER — Encounter (HOSPITAL_COMMUNITY): Payer: Self-pay

## 2019-06-11 ENCOUNTER — Other Ambulatory Visit: Payer: Self-pay

## 2019-06-11 VITALS — BP 114/83 | HR 80 | Wt 249.4 lb

## 2019-06-11 DIAGNOSIS — Z7901 Long term (current) use of anticoagulants: Secondary | ICD-10-CM | POA: Diagnosis not present

## 2019-06-11 DIAGNOSIS — K219 Gastro-esophageal reflux disease without esophagitis: Secondary | ICD-10-CM | POA: Diagnosis not present

## 2019-06-11 DIAGNOSIS — F419 Anxiety disorder, unspecified: Secondary | ICD-10-CM | POA: Diagnosis not present

## 2019-06-11 DIAGNOSIS — F17210 Nicotine dependence, cigarettes, uncomplicated: Secondary | ICD-10-CM | POA: Diagnosis not present

## 2019-06-11 DIAGNOSIS — Z8249 Family history of ischemic heart disease and other diseases of the circulatory system: Secondary | ICD-10-CM | POA: Diagnosis not present

## 2019-06-11 DIAGNOSIS — I5022 Chronic systolic (congestive) heart failure: Secondary | ICD-10-CM | POA: Insufficient documentation

## 2019-06-11 DIAGNOSIS — Z95811 Presence of heart assist device: Secondary | ICD-10-CM | POA: Diagnosis present

## 2019-06-11 DIAGNOSIS — Z8711 Personal history of peptic ulcer disease: Secondary | ICD-10-CM | POA: Insufficient documentation

## 2019-06-11 DIAGNOSIS — Z79899 Other long term (current) drug therapy: Secondary | ICD-10-CM | POA: Diagnosis not present

## 2019-06-11 DIAGNOSIS — E876 Hypokalemia: Secondary | ICD-10-CM | POA: Insufficient documentation

## 2019-06-11 DIAGNOSIS — G4733 Obstructive sleep apnea (adult) (pediatric): Secondary | ICD-10-CM | POA: Insufficient documentation

## 2019-06-11 DIAGNOSIS — I110 Hypertensive heart disease with heart failure: Secondary | ICD-10-CM | POA: Insufficient documentation

## 2019-06-11 DIAGNOSIS — I428 Other cardiomyopathies: Secondary | ICD-10-CM | POA: Diagnosis not present

## 2019-06-11 DIAGNOSIS — Z720 Tobacco use: Secondary | ICD-10-CM

## 2019-06-11 DIAGNOSIS — Z8719 Personal history of other diseases of the digestive system: Secondary | ICD-10-CM | POA: Insufficient documentation

## 2019-06-11 LAB — BASIC METABOLIC PANEL
Anion gap: 13 (ref 5–15)
BUN: 6 mg/dL (ref 6–20)
CO2: 20 mmol/L — ABNORMAL LOW (ref 22–32)
Calcium: 8.7 mg/dL — ABNORMAL LOW (ref 8.9–10.3)
Chloride: 106 mmol/L (ref 98–111)
Creatinine, Ser: 1.24 mg/dL (ref 0.61–1.24)
GFR calc Af Amer: >60 mL/min (ref >60)
GFR calc non Af Amer: >60 mL/min (ref >60)
Glucose, Bld: 199 mg/dL — ABNORMAL HIGH (ref 70–99)
Potassium: 3.4 mmol/L — ABNORMAL LOW (ref 3.5–5.1)
Sodium: 139 mmol/L (ref 135–145)

## 2019-06-11 LAB — LACTATE DEHYDROGENASE: LDH: 268 U/L — ABNORMAL HIGH (ref 98–192)

## 2019-06-11 LAB — PROTIME-INR
INR: 2.6 — ABNORMAL HIGH (ref 0.8–1.2)
Prothrombin Time: 27.8 seconds — ABNORMAL HIGH (ref 11.4–15.2)

## 2019-06-11 LAB — CBC
HCT: 42.6 % (ref 39.0–52.0)
Hemoglobin: 12.9 g/dL — ABNORMAL LOW (ref 13.0–17.0)
MCH: 28.5 pg (ref 26.0–34.0)
MCHC: 30.3 g/dL (ref 30.0–36.0)
MCV: 94.2 fL (ref 80.0–100.0)
Platelets: 160 10*3/uL (ref 150–400)
RBC: 4.52 MIL/uL (ref 4.22–5.81)
RDW: 18.3 % — ABNORMAL HIGH (ref 11.5–15.5)
WBC: 5.6 10*3/uL (ref 4.0–10.5)
nRBC: 0 % (ref 0.0–0.2)

## 2019-06-11 LAB — TSH: TSH: 0.928 u[IU]/mL (ref 0.350–4.500)

## 2019-06-11 MED ORDER — LINACLOTIDE 145 MCG PO CAPS: 145.0000 ug | ORAL_CAPSULE | Freq: Every day | ORAL | 4 refills | Status: DC

## 2019-06-11 NOTE — Telephone Encounter (Signed)
Refill for Linzess was sent on 06/08/2019 to [facility]. Pt advised to contact pharmacy.

## 2019-06-11 NOTE — Progress Notes (Signed)
Patient presents for 1 month f/u VAD clinic today alone. Denies any issues with his VAD or alarms.   Pt states he is feeling "really good" at home. Reports occassional shortness of breath with activity.  Encouraged patient to increase his activity as tolerated. He is smoking 1-2 cigarettes per day. Encouraged total cessation.   Weight maintained today. Pt states he is eating one meal per day. Discussed importance of low sugar/low carb/heart healthy balanced diet.   Over the last 3 days he reports blood in stool/toilet water and when he wipes. Hgb 12.9 today; 13.0 last week. Per Dr [name] this may be related to his hemorrhoids.    Vital Signs:   Doppler Pressure: 104 Automatc BP: 114/83 (98) HR:  80 SPO2: 98  Weight: 249.4  lbs w/o eqt Last weight: 250.6 lb  VAD Indication: Destination Therapy   VAD interrogation & Equipment Management: Speed: 5600 Flow: 5.0  Power: 4.3w    PI: 3.7 Hct: 35 Alarms: multiple low voltage advisories; 1/22- multiple NO EXTERNAL POWER (allowed batteries to die and accidentally double disconnected), 1/23 few LOW VOLTAGE and 1 NO EXTERNAL POWER (son accidentally pulled cord out of wall), 1/25 LOW VOLTAGE. Had long discussion regarding importance of changing batteries when prompted and not sleeping at night on batteries.  Events: 10-20  Fixed speed 5600 Low speed limit: 5300  Primary Controller:  Replace back up battery in 21 months.  Back up controller:   Replace back up battery in 15 months.  Annual Equipment Maintenance on UBC/PM was performed on 4/19.   I reviewed the LVAD parameters from today and compared the results to the patient's prior recorded data. LVAD interrogation was NEGATIVE for significant power changes, NEGATIVE for clinical alarms and STABLE for PI events/speed drops. No programming changes were made and pump is functioning within specified parameters. Pt is performing daily controller and system monitor self tests along with  completing weekly and monthly maintenance for LVAD equipment.  LVAD equipment check completed and is in good working order. Back-up equipment present.   Exit Site Care: Drive line is being maintained twice weekly by his daughter. Current dressing is intact but drive line is bent at an extreme angle from exit site. Discussed importance of drive line positioning for tissue ingrowth and to prevent lead fractures. Existing VAD dressing removed and site care performed using sterile technique. Drive line exit site cleaned with Chlora prep applicators x 2, allowed to dry, and sorbaview dressing applied with bio patch re-applied. Drive line exit site incorporated, the velour is fully implanted at exit site. No drainage, erythema, tenderness, or foul odor noted.  Pt denies fever or chills. Will advance to weekly dressing changes. Instructed patient to contact VAD coordinators if drainage, tenderness, or odor occurs. Provided with 8 weekly dressing kits and 6 anchors for home use.    Significant Events on VAD Support:   Device:none  BP & Labs:  Doppler 104 - reflecting modified systolic  Hgb 12.9 - No S/S of bleeding. Specifically denies melena/BRBPR or nosebleeds.  LDH 268 stable at  with established baseline of 200-300. Denies tea-colored urine. No power elevations noted on interrogation.   Patient Instructions:  1. Stop Amiodarone 2. May take extra Torsemide as needed 3. Coumadin dosing per Leotis Shames PharmD 4. Return to clinic in 2 months  Alyce Pagan RN VAD Coordinator  Office: [phone]  24/7 Pager: [phone]

## 2019-06-11 NOTE — Patient Instructions (Signed)
1. Stop Amiodarone 2. May take extra Torsemide as needed 3. Coumadin dosing per Leotis Shames PharmD 4. Return to clinic in 2 months

## 2019-06-11 NOTE — Progress Notes (Signed)
LVAD INR 

## 2019-06-12 LAB — T4: T4, Total: 8 ug/dL (ref 4.5–12.0)

## 2019-06-12 NOTE — Progress Notes (Signed)
VAD Clinic Note   Date:  06/12/2019   ID:  Jonathan Wood, DOB 1970/08/20, MRN [id number]  Location: Home  Provider location: [place] [facility] Type of Visit: Established patient  PCP: [facility]  Cardiologist:  [name], MD Primary HF: Dr [name]   Chief Complaint: Heart Failure/LVAD  History of Present Illness: Jonathan Wood is a 49 y.o. 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  Admitted 08/21/2018 with volume overload and anemia. Diuresed with IV lasix and transitioned to torsemide 20 mg daily. GI consulted EGD completed and showed gastritis and nonbleeding duodenal ulcers, colonoscopy with 5 polyps removed. Protonix increased to BID, he was started on PO iron, and hydrocortisone suppositories for hemorrhoid treatment. He received a total of 2 uPRBCs. He will stay off ASA for several weeks but can reconsider later.  Discharge weight was 221 pounds.   Admitted 12/7-15/20 for DL infection with overlying cellulitis and volume overload. Treated with IV abx and diuresis.   Here for f/u. Says he is feeling good. Fluid has been well controlled. Occasioanally feels bloated but trying to watch what he eats. Continues with mild DOE but no problems with ADLs. Continues to smoke 1-2 cigs/day. Over the last 3 days he reports blood in stool/toilet water and when he wipes. Denies orthopnea or PND. No fevers, chills or problems with driveline. No melena or neuro symptoms. No VAD alarms. Taking all meds as prescribed.   VAD Indication: Destination Therapy   VAD interrogation & Equipment Management: Speed: 5600 Flow: 5.0  Power: 4.3w PI: 3.7 Hct: 35 Alarms: multiple low voltage advisories; 1/22- multiple NO EXTERNAL POWER (allowed batteries to die and accidentally double disconnected), 1/23 few LOW VOLTAGE and 1 NO EXTERNAL POWER (son accidentally pulled  cord out of wall), 1/25 LOW VOLTAGE. Had long discussion regarding importance of changing batteries when prompted and not sleeping at night on batteries.  Events: 10-20  Fixed speed 5600 Low speed limit: 5300  Primary Controller: Replace back up battery in 21 months.  Back up controller: Replace back up battery in 15 months. Annual Equipment Maintenance on UBC/PM was performed on 4/19.   Past Medical History:  Diagnosis Date  . Asthma   . CHF (congestive heart failure) ([place])    a. 09/2016: EF 20-25% with cath showing normal cors  . GERD (gastroesophageal reflux disease)   . History of hiatal hernia   . OSA on CPAP 09/06/2018   Severe OSA with AHI 68/hr on CPAP at 12cm H2O   Past Surgical History:  Procedure Laterality Date  . BIOPSY  08/23/2018   Procedure: BIOPSY;  Surgeon: Rush Landmark Telford Nab., MD;  Location: [place];  Service: Gastroenterology;;  . COLONOSCOPY N/A 08/23/2018   Procedure: COLONOSCOPY;  Surgeon: Irving Copas., MD;  Location: [place];  Service: Gastroenterology;  Laterality: N/A;  . ENTEROSCOPY N/A 08/23/2018   Procedure: ENTEROSCOPY;  Surgeon: Rush Landmark Telford Nab., MD;  Location: [place];  Service: Gastroenterology;  Laterality: N/A;  . HEMOSTASIS CLIP PLACEMENT  08/23/2018   Procedure: HEMOSTASIS CLIP PLACEMENT;  Surgeon: Irving Copas., MD;  Location: [place];  Service: Gastroenterology;;  . IABP INSERTION N/A 09/05/2017   Procedure: IABP INSERTION;  Surgeon: Jolaine Artist, MD;  Location: [place] [facility];  Service: Cardiovascular;  Laterality: N/A;  . INSERTION OF IMPLANTABLE LEFT VENTRICULAR ASSIST DEVICE N/A 09/06/2017   Procedure:  INSERTION OF IMPLANTABLE LEFT VENTRICULAR ASSIST DEVICE - HM3;  Surgeon: Kerin Perna, MD;  Location: [facility] OR;  Service: Open Heart Surgery;  Laterality: N/A;  HM3  . NASAL FRACTURE SURGERY  1987  . POLYPECTOMY  08/23/2018   Procedure: POLYPECTOMY;  Surgeon: Mansouraty, Netty Starring.,  MD;  Location: [facility] ENDOSCOPY;  Service: Gastroenterology;;  . RIGHT HEART CATH N/A 08/30/2017   Procedure: RIGHT HEART CATH;  Surgeon: Dolores Patty, MD;  Location: [facility] INVASIVE CV LAB;  Service: Cardiovascular;  Laterality: N/A;  . RIGHT/LEFT HEART CATH AND CORONARY ANGIOGRAPHY N/A 10/08/2016   Procedure: Right/Left Heart Cath and Coronary Angiography;  Surgeon: Orpah Cobb, MD;  Location: [facility] INVASIVE CV LAB;  Service: Cardiovascular;  Laterality: N/A;  . SUBMUCOSAL TATTOO INJECTION  08/23/2018   Procedure: SUBMUCOSAL TATTOO INJECTION;  Surgeon: Lemar Lofty., MD;  [facility];  Service: Gastroenterology;;  . TEE WITHOUT CARDIOVERSION N/A 09/06/2017   Procedure: TRANSESOPHAGEAL ECHOCARDIOGRAM (TEE);  Surgeon: Donata Clay, Theron Arista, MD;  Location: [facility] OR;  Service: Open Heart Surgery;  Laterality: N/A;     Current Outpatient Medications  Medication Sig Dispense Refill  . amiodarone (PACERONE) 200 MG tablet Take 1 tablet (200 mg total) by mouth daily. 90 tablet 3  . citalopram (CELEXA) 10 MG tablet Take 1 tablet (10 mg total) by mouth daily. 90 tablet 5  . gabapentin (NEURONTIN) 300 MG capsule TAKE ONE CAPSULE BY MOUTH THREE TIMES DAILY 90 capsule 6  . hydrALAZINE (APRESOLINE) 50 MG tablet Take 1 tablet (50 mg total) by mouth 3 (three) times daily. 90 tablet 3  . hydrOXYzine (ATARAX/VISTARIL) 25 MG tablet Take 25 mg by mouth every 8 (eight) hours as needed for anxiety.    . pantoprazole (PROTONIX) 40 MG tablet Take 1 tablet (40 mg total) by mouth 2 (two) times daily. 60 tablet 6  . potassium chloride SA (KLOR-CON M20) 20 MEQ tablet Take 1 tablet (20 mEq total) by mouth daily. 30 tablet 3  . sildenafil (REVATIO) 20 MG tablet Take 1 tablet (20 mg total) by mouth 3 (three) times daily. 90 tablet 5  . torsemide (DEMADEX) 20 MG tablet TAKE 1 TABLET BY MOUTH DAILY FOR 30 DAYS (Patient taking differently: Take 20 mg by mouth every other day. ) 30 tablet 3  . warfarin (COUMADIN) 5 MG  tablet Take 7.5 mg (1.5 tablet) every Monday/Wednesday then 5 mg (1 tablet) all other days or as directed by HF clinic. 180 tablet 3  . docusate sodium (COLACE) 100 MG capsule Take 1 capsule (100 mg total) by mouth 3 (three) times daily. (Patient not taking: Reported on 06/11/2019) 90 capsule 2  . ferrous sulfate 325 (65 FE) MG tablet Take 325 mg by mouth daily with breakfast.    . linaclotide (LINZESS) 145 MCG CAPS capsule Take 1 capsule (145 mcg total) by mouth daily before breakfast. 30 capsule 4   No current facility-administered medications for this encounter.    Allergies:   Patient has no known allergies.   Social History:  The patient  reports that he has been smoking cigarettes. He has a 12.50 pack-year smoking history. He has never used smokeless tobacco. He reports current alcohol use. He reports that he does not use drugs.   Family History:  The patient's family history includes Hypertension in his father.   ROS:  Please see the history of present illness.   All other systems are personally reviewed and negative.     Vital Signs:   Doppler Pressure:  104 Automatc BP: 114/83 (98) HR:  80 SPO2: 98  Weight: 249.4  lbs w/o eqt Last weight: 250.6 lb   Exam:   General:  NAD.  HEENT: normal  Neck: supple. JVP not elevated.  Carotids 2+ bilat; no bruits. No lymphadenopathy or thryomegaly appreciated. Cor: LVAD hum.  Lungs: Clear. Abdomen: obese soft, nontender, non-distended. No hepatosplenomegaly. No bruits or masses. Good bowel sounds. Driveline site clean. Anchor in place.  Extremities: no cyanosis, clubbing, rash. Warm no edema  Neuro: alert & oriented x 3. No focal deficits. Moves all 4 without problem      Recent Labs: 04/23/2019: ALT 21; B Natriuretic Peptide 709.6 04/28/2019: Magnesium 1.8 06/11/2019: BUN 6; Creatinine, Ser 1.24; Hemoglobin 12.9; Platelets 160; Potassium 3.4; Sodium 139; TSH 0.928    Wt Readings from Last 3 Encounters:  06/11/19 113.1 kg (249  lb 6.4 oz)  05/08/19 113.7 kg (250 lb 9.6 oz)  05/01/19 108.1 kg (238 lb 5.1 oz)      ASSESSMENT AND PLAN:  1. Chronic systolic HF - EF 61% s/p HM-3 LVAD on 09/06/17 - Volume status stable. - NYHA II- early III - Trying to be more complaint with his diet but I suspect he still ahs a long way to go  2. HM3 LVAD 09/06/2017.  - VAD interrogated personally. Parameters stable. - LDH 268 - INR 2.6 goal 2.0-2.5 Discussed dosing with PharmD personally. - Off ASA with PUD.   3. DL infection - resolved  4. Hypokalemia - K 3.4 today. Will supp   5. Anxiety - Continue Celexa.    - Ok to use trazadone for sleep  6. Essential HTN - MAPs slightly elevated - Had AKI with Entresto. Will not restart currently.  6. OSA - sleep study with very severe OSA (AHI 69/hr) - continues to struggle with mask but will f/u with Dr. Mayford Knife - encouraged weight loss - ? Need to consider Inspire device  7. H/o GI bleed  - 08/2018 EGD completed and showed gastritis and nonbleeding duodenal ulcers, colonoscopy with 5 polyps removed. Protonix was increased to BID.  - recently with some BRBPR but suspect hemorrhoidal - hgb 10.9 -> 10.5 -> 12.9 - Recently received Feraheme - He will stay off aspirin for now.   8. Tobacco Abuse  - Still smoking a few cigarettes per day. Discussed need for complete cessation again today  Total time spent 35 minutes. Over half that time spent discussing above.   Signed, Arvilla Meres, MD  06/12/2019 9:04 PM  [facility] [address] [facility] [place] [address] [phone] (office) [phone] (fax)

## 2019-06-22 ENCOUNTER — Other Ambulatory Visit (HOSPITAL_COMMUNITY): Payer: Self-pay | Admitting: Unknown Physician Specialty

## 2019-06-22 DIAGNOSIS — Z95811 Presence of heart assist device: Secondary | ICD-10-CM

## 2019-06-22 DIAGNOSIS — Z7901 Long term (current) use of anticoagulants: Secondary | ICD-10-CM

## 2019-06-27 ENCOUNTER — Telehealth (HOSPITAL_COMMUNITY): Payer: Self-pay | Admitting: *Deleted

## 2019-06-27 DIAGNOSIS — T827XXA Infection and inflammatory reaction due to other cardiac and vascular devices, implants and grafts, initial encounter: Secondary | ICD-10-CM

## 2019-06-27 MED ORDER — CEPHALEXIN 500 MG PO CAPS: 500.0000 mg | ORAL_CAPSULE | Freq: Three times a day (TID) | ORAL | 0 refills | Status: DC

## 2019-06-27 NOTE — Telephone Encounter (Signed)
Pt called VAD pager this am to report tenderness at VAD drive line site that started "yesterday". He reports he sees no drainage, denies fever, chills. He completed Doxy late December and hasn't had any issues since that time. He does report "pulling" on DL site that may have caused the tenderness and wants Doxy to "keep it from getting worse".    Pt says his dressing change is currently once weekly. Advised him if he sees any drainage, he will need to switch to gauze dressings and perform more frequently. Also stressed importance of calling if DL tenderness worsens, or he develops drainage, redness, foul odor, fever, or chills. Advised Dr. [name] will want VAD Coordinator to inspect the site if any of those things occur, pt verbalized agreement to same.  Keflex 500 mg three times daily for 7 days sent to local pharmacy per Dr. [name]. Pt verbalized understanding of same.  Hessie Diener RN, VAD Coordinator [phone]

## 2019-06-27 NOTE — Telephone Encounter (Signed)
Spoke with patient regarding missed INR appointment on 06/25/19. Reports that his car has broken down again. He will go to [facility] 06/28/19 for lab draw.   Alyce Pagan RN VAD Coordinator  Office: [phone]  24/7 Pager: [phone] '

## 2019-06-29 ENCOUNTER — Ambulatory Visit (HOSPITAL_COMMUNITY): Payer: Self-pay | Admitting: Pharmacist

## 2019-06-29 ENCOUNTER — Other Ambulatory Visit (HOSPITAL_COMMUNITY)
Admission: RE | Admit: 2019-06-29 | Discharge: 2019-06-29 | Disposition: A | Discharge status: Home / Self Care | Payer: Medicaid Other | Source: Ambulatory Visit | Attending: Internal Medicine | Admitting: Internal Medicine

## 2019-06-29 DIAGNOSIS — Z95811 Presence of heart assist device: Secondary | ICD-10-CM | POA: Diagnosis present

## 2019-06-29 DIAGNOSIS — Z7901 Long term (current) use of anticoagulants: Secondary | ICD-10-CM | POA: Diagnosis present

## 2019-06-29 LAB — BASIC METABOLIC PANEL
Anion gap: 9 (ref 5–15)
BUN: 10 mg/dL (ref 6–20)
CO2: 19 mmol/L — ABNORMAL LOW (ref 22–32)
Calcium: 8.9 mg/dL (ref 8.9–10.3)
Chloride: 107 mmol/L (ref 98–111)
Creatinine, Ser: 1.19 mg/dL (ref 0.61–1.24)
GFR calc Af Amer: >60 mL/min (ref >60)
GFR calc non Af Amer: >60 mL/min (ref >60)
Glucose, Bld: 196 mg/dL — ABNORMAL HIGH (ref 70–99)
Potassium: 3.3 mmol/L — ABNORMAL LOW (ref 3.5–5.1)
Sodium: 135 mmol/L (ref 135–145)

## 2019-06-29 LAB — CBC
HCT: 43.8 % (ref 39.0–52.0)
Hemoglobin: 13.1 g/dL (ref 13.0–17.0)
MCH: 28.2 pg (ref 26.0–34.0)
MCHC: 29.9 g/dL — ABNORMAL LOW (ref 30.0–36.0)
MCV: 94.2 fL (ref 80.0–100.0)
Platelets: 202 10*3/uL (ref 150–400)
RBC: 4.65 MIL/uL (ref 4.22–5.81)
RDW: 16.7 % — ABNORMAL HIGH (ref 11.5–15.5)
WBC: 7.2 10*3/uL (ref 4.0–10.5)
nRBC: 0 % (ref 0.0–0.2)

## 2019-06-29 LAB — LACTATE DEHYDROGENASE: LDH: 239 U/L — ABNORMAL HIGH (ref 98–192)

## 2019-06-29 LAB — PROTIME-INR
INR: 2 — ABNORMAL HIGH (ref 0.8–1.2)
Prothrombin Time: 22.6 seconds — ABNORMAL HIGH (ref 11.4–15.2)

## 2019-06-29 NOTE — Progress Notes (Signed)
LVAD INR 

## 2019-07-15 ENCOUNTER — Telehealth (HOSPITAL_COMMUNITY): Payer: Self-pay | Admitting: Unknown Physician Specialty

## 2019-07-15 MED ORDER — DOXYCYCLINE HYCLATE 100 MG PO CAPS: 100.0000 mg | ORAL_CAPSULE | Freq: Two times a day (BID) | ORAL | 0 refills | Status: AC

## 2019-07-15 NOTE — Telephone Encounter (Addendum)
Pt called VAD pager this afternoon to report tenderness at VAD drive line site that started "this morning". He reports he sees no drainage, denies fever, chills. He completed keflex earlier this month all except  2 pills but says the soreness went away and his driveline was back to normal. He does report "pulling" on DL site that may have caused the tenderness while he is sleeping. Pt is not wearing an abdominal binder at night.    Pt says his dressing change is currently once weekly using the gauze kits because "I ran out of clear kits". Advised him if he sees any drainage, he will need to let us know and perform more frequently. Also stressed importance of calling if DL tenderness worsens, or he develops drainage, redness, foul odor, fever, or chills. Advised Dr. [name] will want VAD Coordinator to inspect the site if any of those things occur, pt verbalized agreement to same.  Pt was given Keflex earlier this month but states that "it hurt my stomach". Doxycycline 100 mg bid for 10 days sent to local pharmacy per Dr. [name]. Pt verbalized understanding of same.  Carlton Adam RN, VAD Coordinator [phone]

## 2019-07-25 ENCOUNTER — Encounter (HOSPITAL_COMMUNITY)
Admission: RE | Admit: 2019-07-25 | Discharge: 2019-07-25 | Disposition: A | Discharge status: Home / Self Care | Payer: Medicare Other | Source: Ambulatory Visit | Attending: Internal Medicine | Admitting: Internal Medicine

## 2019-07-25 ENCOUNTER — Ambulatory Visit (HOSPITAL_COMMUNITY): Payer: Self-pay | Admitting: Pharmacist

## 2019-07-25 ENCOUNTER — Other Ambulatory Visit: Payer: Self-pay

## 2019-07-25 DIAGNOSIS — Z7901 Long term (current) use of anticoagulants: Secondary | ICD-10-CM | POA: Insufficient documentation

## 2019-07-25 DIAGNOSIS — Z95811 Presence of heart assist device: Secondary | ICD-10-CM

## 2019-07-25 LAB — CBC WITH DIFFERENTIAL/PLATELET
Abs Immature Granulocytes: 0.13 10*3/uL — ABNORMAL HIGH (ref 0.00–0.07)
Basophils Absolute: 0.1 10*3/uL (ref 0.0–0.1)
Basophils Relative: 1 %
Eosinophils Absolute: 0.1 10*3/uL (ref 0.0–0.5)
Eosinophils Relative: 1 %
HCT: 41.5 % (ref 39.0–52.0)
Hemoglobin: 12.8 g/dL — ABNORMAL LOW (ref 13.0–17.0)
Immature Granulocytes: 2 %
Lymphocytes Relative: 25 %
Lymphs Abs: 1.6 10*3/uL (ref 0.7–4.0)
MCH: 28.8 pg (ref 26.0–34.0)
MCHC: 30.8 g/dL (ref 30.0–36.0)
MCV: 93.3 fL (ref 80.0–100.0)
Monocytes Absolute: 0.6 10*3/uL (ref 0.1–1.0)
Monocytes Relative: 9 %
Neutro Abs: 4 10*3/uL (ref 1.7–7.7)
Neutrophils Relative %: 62 %
Platelets: 205 10*3/uL (ref 150–400)
RBC: 4.45 MIL/uL (ref 4.22–5.81)
RDW: 15.6 % — ABNORMAL HIGH (ref 11.5–15.5)
WBC: 6.5 10*3/uL (ref 4.0–10.5)
nRBC: 0 % (ref 0.0–0.2)

## 2019-07-25 LAB — BASIC METABOLIC PANEL
Anion gap: 9 (ref 5–15)
BUN: 12 mg/dL (ref 6–20)
CO2: 17 mmol/L — ABNORMAL LOW (ref 22–32)
Calcium: 8.2 mg/dL — ABNORMAL LOW (ref 8.9–10.3)
Chloride: 104 mmol/L (ref 98–111)
Creatinine, Ser: 1.01 mg/dL (ref 0.61–1.24)
GFR calc Af Amer: >60 mL/min (ref >60)
GFR calc non Af Amer: >60 mL/min (ref >60)
Glucose, Bld: 172 mg/dL — ABNORMAL HIGH (ref 70–99)
Potassium: 4 mmol/L (ref 3.5–5.1)
Sodium: 130 mmol/L — ABNORMAL LOW (ref 135–145)

## 2019-07-25 LAB — PROTIME-INR
INR: 3.5 — ABNORMAL HIGH (ref 0.8–1.2)
Prothrombin Time: 35.3 seconds — ABNORMAL HIGH (ref 11.4–15.2)

## 2019-07-25 LAB — LACTATE DEHYDROGENASE: LDH: 218 U/L — ABNORMAL HIGH (ref 98–192)

## 2019-07-25 NOTE — Progress Notes (Signed)
LVAD INR 

## 2019-08-05 ENCOUNTER — Other Ambulatory Visit (HOSPITAL_COMMUNITY): Payer: Self-pay | Admitting: Adult Health

## 2019-08-05 DIAGNOSIS — I5043 Acute on chronic combined systolic (congestive) and diastolic (congestive) heart failure: Secondary | ICD-10-CM

## 2019-08-05 DIAGNOSIS — Z95811 Presence of heart assist device: Secondary | ICD-10-CM

## 2019-08-10 ENCOUNTER — Other Ambulatory Visit (HOSPITAL_COMMUNITY): Payer: Self-pay | Admitting: *Deleted

## 2019-08-10 DIAGNOSIS — Z7901 Long term (current) use of anticoagulants: Secondary | ICD-10-CM

## 2019-08-10 DIAGNOSIS — Z79899 Other long term (current) drug therapy: Secondary | ICD-10-CM

## 2019-08-10 DIAGNOSIS — Z95811 Presence of heart assist device: Secondary | ICD-10-CM

## 2019-08-13 ENCOUNTER — Other Ambulatory Visit: Payer: Self-pay

## 2019-08-13 ENCOUNTER — Ambulatory Visit (HOSPITAL_COMMUNITY)
Admission: RE | Admit: 2019-08-13 | Discharge: 2019-08-13 | Disposition: A | Discharge status: Home / Self Care | Payer: Medicare Other | Source: Ambulatory Visit | Attending: Cardiology | Admitting: Cardiology

## 2019-08-13 ENCOUNTER — Ambulatory Visit (HOSPITAL_COMMUNITY): Payer: Self-pay | Admitting: Pharmacist

## 2019-08-13 VITALS — BP 94/0 | HR 107 | Ht 74.0 in | Wt 231.0 lb

## 2019-08-13 DIAGNOSIS — Z95811 Presence of heart assist device: Secondary | ICD-10-CM

## 2019-08-13 DIAGNOSIS — Z720 Tobacco use: Secondary | ICD-10-CM

## 2019-08-13 DIAGNOSIS — Z87891 Personal history of nicotine dependence: Secondary | ICD-10-CM | POA: Diagnosis not present

## 2019-08-13 DIAGNOSIS — Z7901 Long term (current) use of anticoagulants: Secondary | ICD-10-CM

## 2019-08-13 DIAGNOSIS — E876 Hypokalemia: Secondary | ICD-10-CM | POA: Diagnosis not present

## 2019-08-13 DIAGNOSIS — Z8711 Personal history of peptic ulcer disease: Secondary | ICD-10-CM | POA: Diagnosis not present

## 2019-08-13 DIAGNOSIS — K219 Gastro-esophageal reflux disease without esophagitis: Secondary | ICD-10-CM | POA: Diagnosis not present

## 2019-08-13 DIAGNOSIS — G4733 Obstructive sleep apnea (adult) (pediatric): Secondary | ICD-10-CM | POA: Diagnosis not present

## 2019-08-13 DIAGNOSIS — J45909 Unspecified asthma, uncomplicated: Secondary | ICD-10-CM | POA: Diagnosis not present

## 2019-08-13 DIAGNOSIS — D72829 Elevated white blood cell count, unspecified: Secondary | ICD-10-CM | POA: Diagnosis not present

## 2019-08-13 DIAGNOSIS — F1010 Alcohol abuse, uncomplicated: Secondary | ICD-10-CM

## 2019-08-13 DIAGNOSIS — I5022 Chronic systolic (congestive) heart failure: Secondary | ICD-10-CM

## 2019-08-13 DIAGNOSIS — Z79899 Other long term (current) drug therapy: Secondary | ICD-10-CM | POA: Diagnosis not present

## 2019-08-13 DIAGNOSIS — D649 Anemia, unspecified: Secondary | ICD-10-CM | POA: Diagnosis not present

## 2019-08-13 DIAGNOSIS — I110 Hypertensive heart disease with heart failure: Secondary | ICD-10-CM | POA: Diagnosis not present

## 2019-08-13 DIAGNOSIS — Z8249 Family history of ischemic heart disease and other diseases of the circulatory system: Secondary | ICD-10-CM | POA: Insufficient documentation

## 2019-08-13 DIAGNOSIS — T827XXA Infection and inflammatory reaction due to other cardiac and vascular devices, implants and grafts, initial encounter: Secondary | ICD-10-CM | POA: Diagnosis not present

## 2019-08-13 LAB — PROTIME-INR
INR: 2.7 — ABNORMAL HIGH (ref 0.8–1.2)
Prothrombin Time: 28.5 seconds — ABNORMAL HIGH (ref 11.4–15.2)

## 2019-08-13 LAB — COMPREHENSIVE METABOLIC PANEL
ALT: 39 U/L (ref 0–44)
AST: 36 U/L (ref 15–41)
Albumin: 3.1 g/dL — ABNORMAL LOW (ref 3.5–5.0)
Alkaline Phosphatase: 193 U/L — ABNORMAL HIGH (ref 38–126)
Anion gap: 15 (ref 5–15)
BUN: 18 mg/dL (ref 6–20)
CO2: 20 mmol/L — ABNORMAL LOW (ref 22–32)
Calcium: 9 mg/dL (ref 8.9–10.3)
Chloride: 95 mmol/L — ABNORMAL LOW (ref 98–111)
Creatinine, Ser: 1.2 mg/dL (ref 0.61–1.24)
GFR calc Af Amer: >60 mL/min (ref >60)
GFR calc non Af Amer: >60 mL/min (ref >60)
Glucose, Bld: 142 mg/dL — ABNORMAL HIGH (ref 70–99)
Potassium: 3.2 mmol/L — ABNORMAL LOW (ref 3.5–5.1)
Sodium: 130 mmol/L — ABNORMAL LOW (ref 135–145)
Total Bilirubin: 3.4 mg/dL — ABNORMAL HIGH (ref 0.3–1.2)
Total Protein: 8.6 g/dL — ABNORMAL HIGH (ref 6.5–8.1)

## 2019-08-13 LAB — CBC
HCT: 41.3 % (ref 39.0–52.0)
Hemoglobin: 13 g/dL (ref 13.0–17.0)
MCH: 27.7 pg (ref 26.0–34.0)
MCHC: 31.5 g/dL (ref 30.0–36.0)
MCV: 87.9 fL (ref 80.0–100.0)
Platelets: 240 10*3/uL (ref 150–400)
RBC: 4.7 MIL/uL (ref 4.22–5.81)
RDW: 14.6 % (ref 11.5–15.5)
WBC: 19.1 10*3/uL — ABNORMAL HIGH (ref 4.0–10.5)
nRBC: 0 % (ref 0.0–0.2)

## 2019-08-13 LAB — PREALBUMIN: Prealbumin: 7.5 mg/dL — ABNORMAL LOW (ref 18–38)

## 2019-08-13 LAB — LACTATE DEHYDROGENASE: LDH: 192 U/L (ref 98–192)

## 2019-08-13 LAB — TSH: TSH: 0.209 u[IU]/mL — ABNORMAL LOW (ref 0.350–4.500)

## 2019-08-13 MED ORDER — POTASSIUM CHLORIDE CRYS ER 20 MEQ PO TBCR: 20.0000 meq | EXTENDED_RELEASE_TABLET | Freq: Every day | ORAL | 6 refills | Status: DC

## 2019-08-13 NOTE — Progress Notes (Signed)
VAD Clinic Note   Date:  08/13/2019   ID:  Jonathan Wood, DOB [birth year], MRN [id number]  Location: Home  Provider location: [facility] [place] [facility] Type of Visit: Established patient  PCP: [name]  Cardiologist:  Arvilla Meres, MD Primary HF: Dr [name]   Chief Complaint: Heart Failure/LVAD  History of Present Illness: Jonathan Wood is a 49 y.o. 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  Admitted 08/21/2018 with volume overload and anemia. Diuresed with IV lasix and transitioned to torsemide 20 mg daily. GI consulted EGD completed and showed gastritis and nonbleeding duodenal ulcers, colonoscopy with 5 polyps removed. Protonix increased to BID, he was started on PO iron, and hydrocortisone suppositories for hemorrhoid treatment. He received a total of 2 uPRBCs. He will stay off ASA for several weeks but can reconsider later.  Discharge weight was 221 pounds.   Admitted 12/7-15/20 for DL infection with overlying cellulitis and volume overload. Treated with IV abx and diuresis.   Here for f/u. He has lost 18 pounds since last visit. States that he is feeling much better. He is eating better and quit smoking for 2 weeks. Ab bloating and SOB much improved. Taking demadex every other day. Also taking Linzess. Denies orthopnea or PND. No fevers, chills or problems with driveline. No bleeding, melena or neuro symptoms. No VAD alarms. Taking all meds as prescribed.   After labs came back with abnormal bilirubin, low prealbumin and VAD RN found note about DUI. She asked patient about his ETOH intake and he stated that he has been drinking liquor and drinking beer more frequent recently. And because of his drinking pt states that he has not been eating very much. Pt states that he stopped drinking 2 weeks ago.   Vital Signs:   Doppler Pressure: 94 Automatc BP: 94/60  (77) HR: 107 SR SPO2: 97  Weight: 231  lbs w/o eqt Last weight: 249.4 lb  VAD Indication: Destination Therapy   VAD interrogation & Equipment Management: Speed: 5600 Flow: 5.5  Power: 4.3w PI: 3.3 Hct: 35 Alarms: multiple low voltage advisories; 3/24- no external power. Pt states that he was at his moms and they had a power loss. Had long discussion regarding importance of changing batteries when prompted and not sleeping at night on batteries.  Events: 5-10  Fixed speed 5600 Low speed limit: 5300  Primary Controller: Replace back up battery in 19 months.  Back up controller: Replace back up battery in 11 months.   Past Medical History:  Diagnosis Date  . Asthma   . CHF (congestive heart failure) (HCC)    a. 09/2016: EF 20-25% with cath showing normal cors  . GERD (gastroesophageal reflux disease)   . History of hiatal hernia   . OSA on CPAP 09/06/2018   Severe OSA with AHI 68/hr on CPAP at 12cm H2O   Past Surgical History:  Procedure Laterality Date  . BIOPSY  08/23/2018   Procedure: BIOPSY;  Surgeon: Meridee Score Netty Starring., MD;  Location: [facility] ENDOSCOPY;  Service: Gastroenterology;;  . COLONOSCOPY N/A 08/23/2018   Procedure: COLONOSCOPY;  Surgeon: Lemar Lofty., MD;  Location: [facility] ENDOSCOPY;  Service: Gastroenterology;  Laterality: N/A;  . ENTEROSCOPY N/A 08/23/2018   Procedure: ENTEROSCOPY;  Surgeon: Meridee Score Netty Starring., MD;  Location: [facility] ENDOSCOPY;  Service: Gastroenterology;  Laterality: N/A;  . HEMOSTASIS CLIP PLACEMENT  08/23/2018  Procedure: HEMOSTASIS CLIP PLACEMENT;  Surgeon: Irving Copas., MD;  Location: [place];  Service: Gastroenterology;;  . IABP INSERTION N/A 09/05/2017   Procedure: IABP INSERTION;  Surgeon: Jolaine Artist, MD;  Location: [place] [facility];  Service: Cardiovascular;  Laterality: N/A;  . INSERTION OF IMPLANTABLE LEFT VENTRICULAR ASSIST DEVICE N/A 09/06/2017   Procedure: INSERTION OF IMPLANTABLE LEFT  VENTRICULAR ASSIST DEVICE - HM3;  Surgeon: Ivin Poot, MD;  Location: [place];  Service: Open Heart Surgery;  Laterality: N/A;  [place]  . POLYPECTOMY  08/23/2018   Procedure: POLYPECTOMY;  Surgeon: Mansouraty, Telford Nab., MD;  Location: [place];  Service: Gastroenterology;;  . RIGHT HEART CATH N/A 08/30/2017   Procedure: RIGHT HEART CATH;  Surgeon: Jolaine Artist, MD;  Location: [place] [facility];  Service: Cardiovascular;  Laterality: N/A;  . RIGHT/LEFT HEART CATH AND CORONARY ANGIOGRAPHY N/A 10/08/2016   Procedure: Right/Left Heart Cath and Coronary Angiography;  Surgeon: Dixie Dials, MD;  Location: [place] [facility];  Service: Cardiovascular;  Laterality: N/A;  . SUBMUCOSAL TATTOO INJECTION  08/23/2018   Procedure: SUBMUCOSAL TATTOO INJECTION;  Surgeon: Irving Copas., MD;  Location: [place];  Service: Gastroenterology;;  . TEE WITHOUT CARDIOVERSION N/A 09/06/2017   Procedure: TRANSESOPHAGEAL ECHOCARDIOGRAM (TEE);  Surgeon: Prescott Gum, Collier Salina, MD;  Location: [place];  Service: Open Heart Surgery;  Laterality: N/A;     Current Outpatient Medications  Medication Sig Dispense Refill  . citalopram (CELEXA) 10 MG tablet Take 1 tablet (10 mg total) by mouth daily. 90 tablet 5  . docusate sodium (COLACE) 100 MG capsule Take 1 capsule (100 mg total) by mouth 3 (three) times daily. 90 capsule 2  . ferrous sulfate 325 (65 FE) MG tablet Take 325 mg by mouth daily with breakfast.    . gabapentin (NEURONTIN) 300 MG capsule TAKE ONE CAPSULE BY MOUTH THREE TIMES DAILY 90 capsule 6  . hydrALAZINE (APRESOLINE) 50 MG tablet Take 1 tablet (50 mg total) by mouth 3 (three) times daily. 90 tablet 3  . linaclotide (LINZESS) 145 MCG CAPS capsule Take 1 capsule (145 mcg total) by mouth daily before breakfast. (Patient taking differently: Take 145 mcg by mouth every other day. ) 30 capsule 4  . pantoprazole (PROTONIX) 40 MG tablet Take 1 tablet (40 mg total) by  mouth 2 (two) times daily. 60 tablet 6  . sildenafil (REVATIO) 20 MG tablet Take 1 tablet (20 mg total) by mouth 3 (three) times daily. 90 tablet 5  . torsemide (DEMADEX) 20 MG tablet Take 1 tablet (20 mg total) by mouth every other day. May take extra if needed 45 tablet 3  . warfarin (COUMADIN) 5 MG tablet Take 7.5 mg (1.5 tablet) every Monday/Wednesday then 5 mg (1 tablet) all other days or as directed by HF clinic. 180 tablet 3  . hydrOXYzine (ATARAX/VISTARIL) 25 MG tablet Take 25 mg by mouth every 8 (eight) hours as needed for anxiety.    . potassium chloride SA (KLOR-CON M20) 20 MEQ tablet Take 1 tablet (20 mEq total) by mouth daily. 30 tablet 6   No current facility-administered medications for this encounter.    Allergies:   Patient has no known allergies.   Social History:  The patient  reports that he has been smoking cigarettes. He has a 12.50 pack-year smoking history. He has never used smokeless tobacco. He reports current alcohol use. He reports that he does not use drugs.   Family  History:  The patient's family history includes Hypertension in his father.   ROS:  Please see the history of present illness.   All other systems are personally reviewed and negative.     Vital Signs:   Doppler Pressure: 104 Automatc BP: 114/83 (98) HR:  80 SPO2: 98  Weight: 249.4  lbs w/o eqt Last weight: 250.6 lb   Exam:   General:  NAD.  HEENT: normal  Neck: supple. JVP not elevated.  Carotids 2+ bilat; no bruits. No lymphadenopathy or thryomegaly appreciated. Cor: LVAD hum.  Lungs: Clear. Abdomen: obese soft, nontender, non-distended. No hepatosplenomegaly. No bruits or masses. Good bowel sounds. Driveline site with mild drainage but nontender. Anchor in place.  Extremities: no cyanosis, clubbing, rash. Warm no edema  Neuro: alert & oriented x 3. No focal deficits. Moves all 4 without problem    Recent Labs: 04/23/2019: B Natriuretic Peptide 709.6 04/28/2019: Magnesium  1.8 08/13/2019: ALT 39; BUN 18; Creatinine, Ser 1.20; Hemoglobin 13.0; Platelets 240; Potassium 3.2; Sodium 130; TSH 0.209    Wt Readings from Last 3 Encounters:  08/13/19 104.8 kg (231 lb)  06/11/19 113.1 kg (249 lb 6.4 oz)  05/08/19 113.7 kg (250 lb 9.6 oz)      ASSESSMENT AND PLAN:  1. Chronic systolic HF - EF 19% s/p HM-3 LVAD on 09/06/17 - Volume status looks good. Much less ab bloating after 18 pound weight loss. - Improved NYHA II-III  2. HM3 LVAD 09/06/2017.  - VAD interrogated personally. Parameters stable. - LDH 192 - INR 3.5 goal 2.0-2.5 Discussed dosing with PharmD personally.. - Off ASA with PUD.   3. DL infection - Improved with recent doxy course - still with mild drainage however doesn't look overtly infected and no fevers. That said WBC is 19k. Follow site closely. Repeat labs later this week  4. Hypokalemia - K 4.0 today.    5. Essential HTN - MAPs ok  - Had AKI with Entresto in past  6. OSA - sleep study with very severe OSA (AHI 69/hr) - continues to struggle with mask but will f/u with Dr. Mayford Knife  7. H/o GI bleed  - 08/2018 EGD completed and showed gastritis and nonbleeding duodenal ulcers, colonoscopy with 5 polyps removed. Protonix was increased to BID.  - No recent GI bleeding - hgb 10.9 -> 10.5 -> 12.9 -> 12.8 - He will stay off aspirin for now.   8. Tobacco Abuse  - says he has quit x 2 weeks  9. Elevated bilirubin and low pre-albumin - suspected related to heavy ETOH use - recent AB CT and RUQ u/s without evidence of cirrhosis - counseled on need for cessation - repeat labs later this week  Total time spent 40 minutes. Over half that time spent discussing above.   Signed, Arvilla Meres, MD  08/13/2019 11:44 PM  [facility] [address] [facility] [place] [address] [phone] (office) [phone] (fax)

## 2019-08-13 NOTE — Progress Notes (Signed)
LVAD INR 

## 2019-08-13 NOTE — Progress Notes (Addendum)
Patient presents for 2 month f/u VAD clinic today. He states that his Mom is in the car.  Denies any issues with his VAD or alarms.   Pt has lost 18 lbs since his last visit. He states that he is feeling much and hasn't smoked in 2 weeks.   Denies any blood in his stools. Pt is taking Demadex every other day rotating with his Linzess.   Pt states that he has been drinking liquor and drinking beer more frequent recently. And because of his drinking pt states that he has not been eating very much. Pt states that he stopped drinking 2 weeks ago.   Vital Signs:   Doppler Pressure: 94 Automatc BP: 94/60 (77) HR: 107 SR SPO2: 97  Weight: 231  lbs w/o eqt Last weight: 249.4 lb  VAD Indication: Destination Therapy   VAD interrogation & Equipment Management: Speed: 5600 Flow: 5.5  Power: 4.3w    PI: 3.3 Hct: 35 Alarms: multiple low voltage advisories; 3/24- no external power. Pt states that he was at his moms and they had a power loss. Had long discussion regarding importance of changing batteries when prompted and not sleeping at night on batteries.  Events: 5-10  Fixed speed 5600 Low speed limit: 5300  Primary Controller:  Replace back up battery in 19 months.  Back up controller:   Replace back up battery in 11 months.  Annual Equipment Maintenance on UBC/PM was performed on 4/21.   I reviewed the LVAD parameters from today and compared the results to the patient's prior recorded data. LVAD interrogation was NEGATIVE for significant power changes, NEGATIVE for clinical alarms and STABLE for PI events/speed drops. No programming changes were made and pump is functioning within specified parameters. Pt is performing daily controller and system monitor self tests along with completing weekly and monthly maintenance for LVAD equipment.  LVAD equipment check completed and is in good working order. Back-up equipment present.   Exit Site Care: Drive line is being maintained twice  weekly by the pt. Current dressing is coming off, random pieces of orange tape, drive line is bent at an extreme angle from exit site. No anchor present. Discussed importance of drive line positioning for tissue ingrowth and to prevent lead fractures. Existing VAD dressing removed and site care performed using sterile technique. Drive line exit site cleaned with Chlora prep applicators x 2, allowed to dry, and gauze dressing applied with silver strip. Drive line exit site incorporated, the velour is fully implanted at exit site. Scant amount of thick yellow drainage, no erythema, tenderness, or foul odor noted.  Pt denies fever or chills. Continue twice weekly dressing changes. Pt informed that he CANNOT perform his own dressing changes. Visit placed for Thursday. Pt instructed to bring his mother in so that we can teach her to do the dressing changes moving forward.   Significant Events on VAD Support:   Device:none  BP & Labs:  Doppler 94 - reflecting modified systolic  Hgb 13 - No S/S of bleeding. Specifically denies melena/BRBPR or nosebleeds.  LDH 192 stable at  with established baseline of 200-300. Denies tea-colored urine. No power elevations noted on interrogation.  2 year Intermacs follow up completed including:  Quality of Life, KCCQ-12, and Neurocognitive trail making.   Pt completed 920 feet during 6 minute walk.  TSH 0.209 T4 pending  Back up controller:  11V backup battery charged during this visit.  Patient Instructions:  1. Increase physical activity. 2. Take 3 potassium  pills today and restart 20 mEq of potassium daily 3. Return to clinic on Thursday at 1100 for a dressing change w/MOM. We will repeat labs on Thursday-CMET, CBC, prealbumin. 4. Return to clinic in 2 months for a full visit w/DR. Bensimhon.  Carlton Adam RN VAD Coordinator  Office: [phone]  24/7 Pager: [phone]

## 2019-08-13 NOTE — Patient Instructions (Signed)
1. No changes in medications. 2. Increase physical activity. 3. Return to clinic on Thursday at 1100 for a dressing change w/MOM. 4. Return to clinic in 2 months for a full visit w/DR. Bensimhon.

## 2019-08-14 LAB — T4: T4, Total: 15 ug/dL — ABNORMAL HIGH (ref 4.5–12.0)

## 2019-08-16 ENCOUNTER — Encounter (HOSPITAL_COMMUNITY): Payer: Self-pay

## 2019-08-16 ENCOUNTER — Inpatient Hospital Stay (HOSPITAL_COMMUNITY): Payer: Medicare Other

## 2019-08-16 ENCOUNTER — Inpatient Hospital Stay (HOSPITAL_COMMUNITY)
Admission: EM | Admit: 2019-08-16 | Discharge: 2019-09-13 | DRG: 264 | Disposition: A | Discharge status: Home Health | Payer: Medicare Other | Source: Ambulatory Visit | Attending: Internal Medicine | Admitting: Internal Medicine

## 2019-08-16 ENCOUNTER — Ambulatory Visit (HOSPITAL_COMMUNITY)
Admission: RE | Admit: 2019-08-16 | Discharge: 2019-08-16 | Disposition: A | Discharge status: Home / Self Care | Payer: Medicare Other | Source: Ambulatory Visit | Attending: Internal Medicine | Admitting: Internal Medicine

## 2019-08-16 ENCOUNTER — Other Ambulatory Visit: Payer: Self-pay

## 2019-08-16 ENCOUNTER — Telehealth (HOSPITAL_COMMUNITY): Payer: Self-pay | Admitting: *Deleted

## 2019-08-16 ENCOUNTER — Encounter (HOSPITAL_COMMUNITY): Payer: Self-pay | Admitting: Emergency Medicine

## 2019-08-16 ENCOUNTER — Emergency Department (HOSPITAL_COMMUNITY): Payer: Medicare Other

## 2019-08-16 VITALS — BP 90/0 | Temp 100.0°F | Wt 230.8 lb

## 2019-08-16 DIAGNOSIS — D72829 Elevated white blood cell count, unspecified: Secondary | ICD-10-CM | POA: Diagnosis not present

## 2019-08-16 DIAGNOSIS — R945 Abnormal results of liver function studies: Secondary | ICD-10-CM | POA: Diagnosis not present

## 2019-08-16 DIAGNOSIS — Z7901 Long term (current) use of anticoagulants: Secondary | ICD-10-CM

## 2019-08-16 DIAGNOSIS — Z8711 Personal history of peptic ulcer disease: Secondary | ICD-10-CM

## 2019-08-16 DIAGNOSIS — I5043 Acute on chronic combined systolic (congestive) and diastolic (congestive) heart failure: Secondary | ICD-10-CM

## 2019-08-16 DIAGNOSIS — F329 Major depressive disorder, single episode, unspecified: Secondary | ICD-10-CM | POA: Diagnosis not present

## 2019-08-16 DIAGNOSIS — R7989 Other specified abnormal findings of blood chemistry: Secondary | ICD-10-CM | POA: Diagnosis present

## 2019-08-16 DIAGNOSIS — T827XXA Infection and inflammatory reaction due to other cardiac and vascular devices, implants and grafts, initial encounter: Principal | ICD-10-CM

## 2019-08-16 DIAGNOSIS — I5023 Acute on chronic systolic (congestive) heart failure: Secondary | ICD-10-CM | POA: Diagnosis not present

## 2019-08-16 DIAGNOSIS — B9561 Methicillin susceptible Staphylococcus aureus infection as the cause of diseases classified elsewhere: Secondary | ICD-10-CM | POA: Diagnosis present

## 2019-08-16 DIAGNOSIS — Z95811 Presence of heart assist device: Secondary | ICD-10-CM

## 2019-08-16 DIAGNOSIS — Z8719 Personal history of other diseases of the digestive system: Secondary | ICD-10-CM

## 2019-08-16 DIAGNOSIS — Z4801 Encounter for change or removal of surgical wound dressing: Secondary | ICD-10-CM | POA: Insufficient documentation

## 2019-08-16 DIAGNOSIS — Z452 Encounter for adjustment and management of vascular access device: Secondary | ICD-10-CM

## 2019-08-16 DIAGNOSIS — N179 Acute kidney failure, unspecified: Secondary | ICD-10-CM

## 2019-08-16 DIAGNOSIS — I428 Other cardiomyopathies: Secondary | ICD-10-CM | POA: Diagnosis not present

## 2019-08-16 DIAGNOSIS — I340 Nonrheumatic mitral (valve) insufficiency: Secondary | ICD-10-CM | POA: Diagnosis not present

## 2019-08-16 DIAGNOSIS — I5042 Chronic combined systolic (congestive) and diastolic (congestive) heart failure: Secondary | ICD-10-CM | POA: Insufficient documentation

## 2019-08-16 DIAGNOSIS — J439 Emphysema, unspecified: Secondary | ICD-10-CM | POA: Diagnosis present

## 2019-08-16 DIAGNOSIS — A4189 Other specified sepsis: Secondary | ICD-10-CM | POA: Diagnosis not present

## 2019-08-16 DIAGNOSIS — F411 Generalized anxiety disorder: Secondary | ICD-10-CM | POA: Diagnosis present

## 2019-08-16 DIAGNOSIS — E876 Hypokalemia: Secondary | ICD-10-CM | POA: Diagnosis not present

## 2019-08-16 DIAGNOSIS — I361 Nonrheumatic tricuspid (valve) insufficiency: Secondary | ICD-10-CM | POA: Diagnosis not present

## 2019-08-16 DIAGNOSIS — I9789 Other postprocedural complications and disorders of the circulatory system, not elsewhere classified: Secondary | ICD-10-CM | POA: Diagnosis not present

## 2019-08-16 DIAGNOSIS — D649 Anemia, unspecified: Secondary | ICD-10-CM | POA: Diagnosis present

## 2019-08-16 DIAGNOSIS — Z20822 Contact with and (suspected) exposure to covid-19: Secondary | ICD-10-CM | POA: Diagnosis not present

## 2019-08-16 DIAGNOSIS — A4101 Sepsis due to Methicillin susceptible Staphylococcus aureus: Secondary | ICD-10-CM | POA: Diagnosis not present

## 2019-08-16 DIAGNOSIS — Z8249 Family history of ischemic heart disease and other diseases of the circulatory system: Secondary | ICD-10-CM

## 2019-08-16 DIAGNOSIS — K581 Irritable bowel syndrome with constipation: Secondary | ICD-10-CM | POA: Diagnosis present

## 2019-08-16 DIAGNOSIS — F1010 Alcohol abuse, uncomplicated: Secondary | ICD-10-CM | POA: Diagnosis present

## 2019-08-16 DIAGNOSIS — I420 Dilated cardiomyopathy: Secondary | ICD-10-CM

## 2019-08-16 DIAGNOSIS — K219 Gastro-esophageal reflux disease without esophagitis: Secondary | ICD-10-CM | POA: Diagnosis present

## 2019-08-16 DIAGNOSIS — J189 Pneumonia, unspecified organism: Secondary | ICD-10-CM | POA: Diagnosis present

## 2019-08-16 DIAGNOSIS — J449 Chronic obstructive pulmonary disease, unspecified: Secondary | ICD-10-CM | POA: Diagnosis present

## 2019-08-16 DIAGNOSIS — Z79899 Other long term (current) drug therapy: Secondary | ICD-10-CM

## 2019-08-16 DIAGNOSIS — R509 Fever, unspecified: Secondary | ICD-10-CM | POA: Diagnosis not present

## 2019-08-16 DIAGNOSIS — Y831 Surgical operation with implant of artificial internal device as the cause of abnormal reaction of the patient, or of later complication, without mention of misadventure at the time of the procedure: Secondary | ICD-10-CM | POA: Diagnosis present

## 2019-08-16 DIAGNOSIS — F17210 Nicotine dependence, cigarettes, uncomplicated: Secondary | ICD-10-CM | POA: Diagnosis present

## 2019-08-16 DIAGNOSIS — Y712 Prosthetic and other implants, materials and accessory cardiovascular devices associated with adverse incidents: Secondary | ICD-10-CM | POA: Diagnosis present

## 2019-08-16 DIAGNOSIS — E871 Hypo-osmolality and hyponatremia: Secondary | ICD-10-CM | POA: Diagnosis not present

## 2019-08-16 DIAGNOSIS — Z8619 Personal history of other infectious and parasitic diseases: Secondary | ICD-10-CM

## 2019-08-16 DIAGNOSIS — L02213 Cutaneous abscess of chest wall: Secondary | ICD-10-CM | POA: Diagnosis not present

## 2019-08-16 DIAGNOSIS — I10 Essential (primary) hypertension: Secondary | ICD-10-CM | POA: Diagnosis present

## 2019-08-16 DIAGNOSIS — I110 Hypertensive heart disease with heart failure: Secondary | ICD-10-CM | POA: Diagnosis not present

## 2019-08-16 DIAGNOSIS — R7881 Bacteremia: Secondary | ICD-10-CM | POA: Diagnosis not present

## 2019-08-16 DIAGNOSIS — E875 Hyperkalemia: Secondary | ICD-10-CM | POA: Diagnosis not present

## 2019-08-16 DIAGNOSIS — I5022 Chronic systolic (congestive) heart failure: Secondary | ICD-10-CM

## 2019-08-16 DIAGNOSIS — Z9989 Dependence on other enabling machines and devices: Secondary | ICD-10-CM

## 2019-08-16 DIAGNOSIS — J181 Lobar pneumonia, unspecified organism: Secondary | ICD-10-CM | POA: Diagnosis not present

## 2019-08-16 DIAGNOSIS — Z86010 Personal history of colonic polyps: Secondary | ICD-10-CM

## 2019-08-16 DIAGNOSIS — Z978 Presence of other specified devices: Secondary | ICD-10-CM | POA: Diagnosis not present

## 2019-08-16 DIAGNOSIS — E43 Unspecified severe protein-calorie malnutrition: Secondary | ICD-10-CM | POA: Diagnosis present

## 2019-08-16 DIAGNOSIS — Z9119 Patient's noncompliance with other medical treatment and regimen: Secondary | ICD-10-CM

## 2019-08-16 DIAGNOSIS — I5082 Biventricular heart failure: Secondary | ICD-10-CM | POA: Diagnosis not present

## 2019-08-16 DIAGNOSIS — G4733 Obstructive sleep apnea (adult) (pediatric): Secondary | ICD-10-CM

## 2019-08-16 DIAGNOSIS — R000 Tachycardia, unspecified: Secondary | ICD-10-CM | POA: Diagnosis not present

## 2019-08-16 DIAGNOSIS — Z6830 Body mass index (BMI) 30.0-30.9, adult: Secondary | ICD-10-CM

## 2019-08-16 DIAGNOSIS — Z95828 Presence of other vascular implants and grafts: Secondary | ICD-10-CM | POA: Diagnosis not present

## 2019-08-16 DIAGNOSIS — R109 Unspecified abdominal pain: Secondary | ICD-10-CM

## 2019-08-16 DIAGNOSIS — A419 Sepsis, unspecified organism: Secondary | ICD-10-CM | POA: Diagnosis present

## 2019-08-16 DIAGNOSIS — Z8614 Personal history of Methicillin resistant Staphylococcus aureus infection: Secondary | ICD-10-CM

## 2019-08-16 HISTORY — DX: Presence of heart assist device: Z95.811

## 2019-08-16 LAB — PROCALCITONIN: Procalcitonin: 3.04 ng/mL

## 2019-08-16 LAB — RAPID URINE DRUG SCREEN, HOSP PERFORMED
Amphetamines: NOT DETECTED
Barbiturates: NOT DETECTED
Benzodiazepines: POSITIVE — AB
Cocaine: NOT DETECTED
Opiates: NOT DETECTED
Tetrahydrocannabinol: POSITIVE — AB

## 2019-08-16 LAB — COMPREHENSIVE METABOLIC PANEL
ALT: 32 U/L (ref 0–44)
AST: 42 U/L — ABNORMAL HIGH (ref 15–41)
Albumin: 2.7 g/dL — ABNORMAL LOW (ref 3.5–5.0)
Alkaline Phosphatase: 157 U/L — ABNORMAL HIGH (ref 38–126)
Anion gap: 14 (ref 5–15)
BUN: 24 mg/dL — ABNORMAL HIGH (ref 6–20)
CO2: 18 mmol/L — ABNORMAL LOW (ref 22–32)
Calcium: 8.7 mg/dL — ABNORMAL LOW (ref 8.9–10.3)
Chloride: 95 mmol/L — ABNORMAL LOW (ref 98–111)
Creatinine, Ser: 1.43 mg/dL — ABNORMAL HIGH (ref 0.61–1.24)
GFR calc Af Amer: >60 mL/min (ref >60)
GFR calc non Af Amer: 58 mL/min — ABNORMAL LOW (ref >60)
Glucose, Bld: 188 mg/dL — ABNORMAL HIGH (ref 70–99)
Potassium: 4.2 mmol/L (ref 3.5–5.1)
Sodium: 127 mmol/L — ABNORMAL LOW (ref 135–145)
Total Bilirubin: 3 mg/dL — ABNORMAL HIGH (ref 0.3–1.2)
Total Protein: 8.3 g/dL — ABNORMAL HIGH (ref 6.5–8.1)

## 2019-08-16 LAB — CBC
HCT: 37.1 % — ABNORMAL LOW (ref 39.0–52.0)
Hemoglobin: 11.8 g/dL — ABNORMAL LOW (ref 13.0–17.0)
MCH: 28.2 pg (ref 26.0–34.0)
MCHC: 31.8 g/dL (ref 30.0–36.0)
MCV: 88.5 fL (ref 80.0–100.0)
Platelets: 285 10*3/uL (ref 150–400)
RBC: 4.19 MIL/uL — ABNORMAL LOW (ref 4.22–5.81)
RDW: 15.3 % (ref 11.5–15.5)
WBC: 27.3 10*3/uL — ABNORMAL HIGH (ref 4.0–10.5)
nRBC: 0.1 % (ref 0.0–0.2)

## 2019-08-16 LAB — URINALYSIS, ROUTINE W REFLEX MICROSCOPIC
Glucose, UA: NEGATIVE mg/dL
Hgb urine dipstick: NEGATIVE
Ketones, ur: NEGATIVE mg/dL
Leukocytes,Ua: NEGATIVE
Nitrite: NEGATIVE
Protein, ur: 30 mg/dL — AB
Specific Gravity, Urine: 1.028 (ref 1.005–1.030)
pH: 5 (ref 5.0–8.0)

## 2019-08-16 LAB — POC SARS CORONAVIRUS 2 AG -  ED: SARS Coronavirus 2 Ag: NEGATIVE

## 2019-08-16 LAB — LACTATE DEHYDROGENASE: LDH: 252 U/L — ABNORMAL HIGH (ref 98–192)

## 2019-08-16 LAB — SARS CORONAVIRUS 2 (TAT 6-24 HRS): SARS Coronavirus 2: NEGATIVE

## 2019-08-16 LAB — LACTIC ACID, PLASMA: Lactic Acid, Venous: 2 mmol/L (ref 0.5–1.9)

## 2019-08-16 LAB — PROTIME-INR
INR: 5.8 (ref 0.8–1.2)
Prothrombin Time: 52.3 seconds — ABNORMAL HIGH (ref 11.4–15.2)

## 2019-08-16 LAB — PREALBUMIN: Prealbumin: 5.5 mg/dL — ABNORMAL LOW (ref 18–38)

## 2019-08-16 LAB — MRSA PCR SCREENING: MRSA by PCR: NEGATIVE

## 2019-08-16 MED ORDER — SODIUM CHLORIDE 0.9 % IV SOLN: INTRAVENOUS | Status: DC

## 2019-08-16 MED ORDER — PHYTONADIONE 5 MG PO TABS
5.0000 mg | ORAL_TABLET | Freq: Once | ORAL | Status: AC
  Administered 2019-08-16: 21:00:00 5 mg via ORAL
  Filled 2019-08-16: qty 1

## 2019-08-16 MED ORDER — CITALOPRAM HYDROBROMIDE 20 MG PO TABS
10.0000 mg | ORAL_TABLET | Freq: Every day | ORAL | Status: DC
  Administered 2019-08-17: 10 mg via ORAL
  Filled 2019-08-16: qty 1

## 2019-08-16 MED ORDER — BISACODYL 5 MG PO TBEC: 10.0000 mg | DELAYED_RELEASE_TABLET | Freq: Every day | ORAL | Status: DC | PRN

## 2019-08-16 MED ORDER — GABAPENTIN 600 MG PO TABS
300.0000 mg | ORAL_TABLET | Freq: Three times a day (TID) | ORAL | Status: DC
  Administered 2019-08-16 – 2019-08-17 (×2): 300 mg via ORAL
  Filled 2019-08-16 (×2): qty 1

## 2019-08-16 MED ORDER — BISACODYL 10 MG RE SUPP: 10.0000 mg | Freq: Every day | RECTAL | Status: DC | PRN

## 2019-08-16 MED ORDER — DOCUSATE SODIUM 100 MG PO CAPS
200.0000 mg | ORAL_CAPSULE | Freq: Every day | ORAL | Status: DC
  Administered 2019-08-17: 10:00:00 200 mg via ORAL
  Filled 2019-08-16 (×2): qty 2

## 2019-08-16 MED ORDER — PANTOPRAZOLE SODIUM 40 MG PO TBEC
40.0000 mg | DELAYED_RELEASE_TABLET | Freq: Two times a day (BID) | ORAL | Status: DC
  Administered 2019-08-16 – 2019-09-13 (×52): 40 mg via ORAL
  Filled 2019-08-16 (×53): qty 1

## 2019-08-16 MED ORDER — SODIUM CHLORIDE 0.9 % IV SOLN
250.0000 mL | INTRAVENOUS | Status: DC | PRN
  Administered 2019-08-22 – 2019-08-28 (×4): 250 mL via INTRAVENOUS

## 2019-08-16 MED ORDER — VANCOMYCIN HCL 2000 MG/400ML IV SOLN
2000.0000 mg | Freq: Once | INTRAVENOUS | Status: AC
  Administered 2019-08-17: 2000 mg via INTRAVENOUS
  Filled 2019-08-16: qty 400

## 2019-08-16 MED ORDER — VANCOMYCIN HCL 1250 MG/250ML IV SOLN
1250.0000 mg | Freq: Two times a day (BID) | INTRAVENOUS | Status: DC
  Filled 2019-08-16: qty 250

## 2019-08-16 MED ORDER — SODIUM CHLORIDE 0.9% FLUSH
3.0000 mL | Freq: Two times a day (BID) | INTRAVENOUS | Status: DC
  Administered 2019-08-17 – 2019-09-06 (×31): 3 mL via INTRAVENOUS

## 2019-08-16 MED ORDER — TRAMADOL HCL 50 MG PO TABS
100.0000 mg | ORAL_TABLET | Freq: Four times a day (QID) | ORAL | Status: DC | PRN
  Administered 2019-08-16 – 2019-09-13 (×8): 100 mg via ORAL
  Filled 2019-08-16 (×8): qty 2

## 2019-08-16 MED ORDER — DOCUSATE SODIUM 100 MG PO CAPS
100.0000 mg | ORAL_CAPSULE | Freq: Three times a day (TID) | ORAL | Status: DC
  Administered 2019-08-17 – 2019-09-10 (×40): 100 mg via ORAL
  Filled 2019-08-16 (×56): qty 1

## 2019-08-16 MED ORDER — FERROUS SULFATE 325 (65 FE) MG PO TABS
325.0000 mg | ORAL_TABLET | Freq: Every day | ORAL | Status: DC
  Administered 2019-08-17 – 2019-09-13 (×24): 325 mg via ORAL
  Filled 2019-08-16 (×23): qty 1

## 2019-08-16 MED ORDER — HYDROXYZINE HCL 25 MG PO TABS
25.0000 mg | ORAL_TABLET | Freq: Three times a day (TID) | ORAL | Status: DC | PRN
  Administered 2019-08-18 – 2019-09-12 (×7): 25 mg via ORAL
  Filled 2019-08-16 (×8): qty 1

## 2019-08-16 MED ORDER — ONDANSETRON HCL 4 MG/2ML IJ SOLN: 4.0000 mg | Freq: Four times a day (QID) | INTRAMUSCULAR | Status: DC | PRN

## 2019-08-16 MED ORDER — GABAPENTIN 300 MG PO CAPS
300.0000 mg | ORAL_CAPSULE | Freq: Three times a day (TID) | ORAL | Status: DC
  Administered 2019-08-16 – 2019-09-13 (×77): 300 mg via ORAL
  Filled 2019-08-16 (×77): qty 1

## 2019-08-16 MED ORDER — HYDRALAZINE HCL 50 MG PO TABS
50.0000 mg | ORAL_TABLET | Freq: Three times a day (TID) | ORAL | Status: DC
  Administered 2019-08-17 – 2019-08-26 (×21): 50 mg via ORAL
  Filled 2019-08-16 (×24): qty 1

## 2019-08-16 MED ORDER — SODIUM CHLORIDE 0.9 % IV SOLN
2.0000 g | Freq: Three times a day (TID) | INTRAVENOUS | Status: DC
  Administered 2019-08-16 – 2019-08-17 (×4): 2 g via INTRAVENOUS
  Filled 2019-08-16 (×5): qty 2

## 2019-08-16 MED ORDER — ACETAMINOPHEN 325 MG PO TABS
650.0000 mg | ORAL_TABLET | ORAL | Status: DC | PRN
  Administered 2019-08-16 – 2019-09-13 (×25): 650 mg via ORAL
  Filled 2019-08-16 (×25): qty 2

## 2019-08-16 MED ORDER — SILDENAFIL CITRATE 20 MG PO TABS
20.0000 mg | ORAL_TABLET | Freq: Three times a day (TID) | ORAL | Status: DC
  Administered 2019-08-16 – 2019-09-13 (×76): 20 mg via ORAL
  Filled 2019-08-16 (×77): qty 1

## 2019-08-16 MED ORDER — SODIUM CHLORIDE 0.9% FLUSH: 3.0000 mL | INTRAVENOUS | Status: DC | PRN

## 2019-08-16 MED ORDER — MAGNESIUM HYDROXIDE 400 MG/5ML PO SUSP
30.0000 mL | Freq: Every day | ORAL | Status: DC | PRN
  Filled 2019-08-16: qty 30

## 2019-08-16 MED ORDER — POTASSIUM CHLORIDE CRYS ER 20 MEQ PO TBCR
20.0000 meq | EXTENDED_RELEASE_TABLET | Freq: Every day | ORAL | Status: DC
  Administered 2019-08-17: 20 meq via ORAL
  Filled 2019-08-16: qty 1

## 2019-08-16 NOTE — ED Provider Notes (Signed)
[facility] EMERGENCY DEPARTMENT Provider Note   CSN: [id number] Arrival date & time: 08/16/19  1446     History No chief complaint on file.   Jonathan Wood is a 49 y.o. male.  HPI Patient sent in from the LVAD clinic with increasing white count.  Has had cough for the last week.  No fevers or chills but is felt fatigued.  White count up to 23.  Denies fevers but upon arrival is febrile.  Has had chest pain in the anterior to left chest.  States his urine is "dark".  Patient has not had his Covid shots.  States he does not trust the government.  States he was told he had costochondritis in the chest.  No nausea vomiting or diarrhea.  No constipation.  States he is just felt "lazy".  Had had his driveline checked and the site looked good although he has had superficial infections at the site previously.  No production with the cough.  LDH was normal 3 days ago.  Also has his INR increasing.    Past Medical History:  Diagnosis Date  . Asthma   . CHF (congestive heart failure) (HCC)    a. 09/2016: EF 20-25% with cath showing normal cors  . GERD (gastroesophageal reflux disease)   . History of hiatal hernia   . OSA on CPAP 09/06/2018   Severe OSA with AHI 68/hr on CPAP at 12cm H2O    Patient Active Problem List   Diagnosis Date Noted  . Sepsis (HCC) 08/16/2019  . Left ventricular assist device (LVAD) complication 04/23/2019  . Increased abdominal girth 10/11/2018  . Chronic constipation 10/11/2018  . Rectal bleeding 10/11/2018  . History of colonic polyps 09/24/2018  . Hemorrhoids 09/24/2018  . Long term current use of anticoagulant therapy 09/24/2018  . OSA on CPAP 09/06/2018  . Acute systolic (congestive) heart failure (HCC) 08/21/2018  . 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/26/2017  . Anxiety 09/30/2017  .  Anemia 09/30/2017  . Left ventricular assist device present (HCC) 09/21/2017  . LVAD (left ventricular assist device) present (HCC)   . Advanced care planning/counseling discussion   . Goals of care, counseling/discussion   . Palliative care encounter   . Elevated LFTs 08/25/2017  . Hepatic congestion 08/25/2017  . RUQ pain 08/25/2017  . Congestive dilated cardiomyopathy (HCC) 10/07/2016    Past Surgical History:  Procedure Laterality Date  . BIOPSY  08/23/2018   Procedure: BIOPSY;  Surgeon: Meridee Score Netty Starring., MD;  Location: [facility] ENDOSCOPY;  Service: Gastroenterology;;  . COLONOSCOPY N/A 08/23/2018   Procedure: COLONOSCOPY;  Surgeon: Lemar Lofty., MD;  Location: [facility] ENDOSCOPY;  Service: Gastroenterology;  Laterality: N/A;  . ENTEROSCOPY N/A 08/23/2018   Procedure: ENTEROSCOPY;  Surgeon: Meridee Score Netty Starring., MD;  Location: [facility] ENDOSCOPY;  Service: Gastroenterology;  Laterality: N/A;  . HEMOSTASIS CLIP PLACEMENT  08/23/2018   Procedure: HEMOSTASIS CLIP PLACEMENT;  Surgeon: Lemar Lofty., MD;  Location: [facility] ENDOSCOPY;  Service: Gastroenterology;;  . IABP INSERTION N/A 09/05/2017   Procedure: IABP INSERTION;  Surgeon: Dolores Patty, MD;  Location: [facility] INVASIVE CV LAB;  Service: Cardiovascular;  Laterality: N/A;  . INSERTION OF IMPLANTABLE LEFT VENTRICULAR ASSIST DEVICE N/A 09/06/2017   Procedure: INSERTION OF IMPLANTABLE LEFT VENTRICULAR ASSIST DEVICE - HM3;  Surgeon: Kerin Perna, MD;  Location: [facility] OR;  Service: Open Heart Surgery;  Laterality: N/A;  HM3  . NASAL FRACTURE SURGERY  1987  . POLYPECTOMY  08/23/2018   Procedure: POLYPECTOMY;  Surgeon: Mansouraty, Telford Nab., MD;  Location: [place];  Service: Gastroenterology;;  . RIGHT HEART CATH N/A 08/30/2017   Procedure: RIGHT HEART CATH;  Surgeon: Jolaine Artist, MD;  Location: [place] CV LAB;  Service: Cardiovascular;  Laterality: N/A;  . RIGHT/LEFT HEART CATH AND CORONARY ANGIOGRAPHY N/A 10/08/2016    Procedure: Right/Left Heart Cath and Coronary Angiography;  Surgeon: Dixie Dials, MD;  Location: [place] CV LAB;  Service: Cardiovascular;  Laterality: N/A;  . SUBMUCOSAL TATTOO INJECTION  08/23/2018   Procedure: SUBMUCOSAL TATTOO INJECTION;  Surgeon: Irving Copas., MD;  Location: [place];  Service: Gastroenterology;;  . TEE WITHOUT CARDIOVERSION N/A 09/06/2017   Procedure: TRANSESOPHAGEAL ECHOCARDIOGRAM (TEE);  Surgeon: Prescott Gum, Collier Salina, MD;  Location: [place];  Service: Open Heart Surgery;  Laterality: N/A;       Family History  Problem Relation Age of Onset  . Hypertension Father   . Colon cancer Neg Hx   . Esophageal cancer Neg Hx   . Inflammatory bowel disease Neg Hx   . Liver disease Neg Hx   . Pancreatic cancer Neg Hx   . Rectal cancer Neg Hx   . Stomach cancer Neg Hx     Social History   Tobacco Use  . Smoking status: Current Every Day Smoker    Packs/day: 0.50    Years: 25.00    Pack years: 12.50    Types: Cigarettes  . Smokeless tobacco: Never Used  . Tobacco comment: quit 1 week  Substance Use Topics  . Alcohol use: Yes    Comment: occas  . Drug use: No    Home Medications Prior to Admission medications   Medication Sig Start Date End Date Taking? Authorizing Provider  citalopram (CELEXA) 10 MG tablet Take 1 tablet (10 mg total) by mouth daily. 09/20/18  Yes Bensimhon, Shaune Pascal, MD  docusate sodium (COLACE) 100 MG capsule Take 1 capsule (100 mg total) by mouth 3 (three) times daily. 02/20/19  Yes Bensimhon, Shaune Pascal, MD  gabapentin (NEURONTIN) 300 MG capsule TAKE ONE CAPSULE BY MOUTH THREE TIMES DAILY Patient taking differently: Take 300 mg by mouth 3 (three) times daily.  06/07/19  Yes Bensimhon, Shaune Pascal, MD  hydrALAZINE (APRESOLINE) 50 MG tablet Take 1 tablet (50 mg total) by mouth 3 (three) times daily. 05/01/19  Yes Lyda Jester M, PA-C  hydrOXYzine (ATARAX/VISTARIL) 25 MG tablet Take 25 mg by mouth every 8 (eight) hours as needed for  anxiety. 08/17/18  Yes [provider]  linaclotide (LINZESS) 145 MCG CAPS capsule Take 1 capsule (145 mcg total) by mouth daily before breakfast. Patient taking differently: Take 145 mcg by mouth every other day.  06/11/19  Yes Mansouraty, Telford Nab., MD  pantoprazole (PROTONIX) 40 MG tablet Take 1 tablet (40 mg total) by mouth 2 (two) times daily. 08/28/18  Yes Clegg, Amy D, NP  potassium chloride SA (KLOR-CON M20) 20 MEQ tablet Take 1 tablet (20 mEq total) by mouth daily. 08/13/19  Yes Bensimhon, Shaune Pascal, MD  sildenafil (REVATIO) 20 MG tablet Take 1 tablet (20 mg total) by mouth 3 (three) times daily. 09/20/18  Yes Bensimhon, Shaune Pascal, MD  torsemide (DEMADEX) 20 MG tablet Take 1 tablet (20 mg total) by mouth every other day. May take extra if needed 08/06/19  Yes Bensimhon, Shaune Pascal, MD  warfarin (COUMADIN) 5 MG tablet Take 7.5 mg (1.5 tablet) every  Monday/Wednesday then 5 mg (1 tablet) all other days or as directed by HF clinic. Patient taking differently: Take 5 mg by mouth See admin instructions. Take 7.5 mg (1.5 tablet) every Monday/Wednesday then 5 mg (1 tablet) all other days or as directed by HF clinic. 05/08/19  Yes Evon Slack, RPH-CPP    Allergies    Patient has no known allergies.  Review of Systems   Review of Systems  Constitutional: Positive for appetite change and fatigue.  HENT: Negative for congestion.   Respiratory: Positive for cough. Negative for shortness of breath.   Gastrointestinal: Negative for abdominal pain and constipation.  Genitourinary: Negative for flank pain.  Musculoskeletal: Negative for back pain.  Skin: Negative for rash.  Neurological: Negative for weakness.  Psychiatric/Behavioral: Negative for confusion.    Physical Exam Updated Vital Signs BP (!) 90/0   Pulse (!) 119   Temp (!) 101.3 F (38.5 C) (Oral)   Ht 6\' 2"  (1.88 m)   Wt 105 kg   SpO2 90%   BMI 29.72 kg/m   Physical Exam Vitals and nursing note reviewed.  HENT:      Head: Atraumatic.  Eyes:     Pupils: Pupils are equal, round, and reactive to light.  Cardiovascular:     Comments: LVAD hum Pulmonary:     Breath sounds: No wheezing or rhonchi.     Comments: Tenderness over anterior left lower chest wall and superior xiphoid area.  May have erythema in the xiphoid area. Chest:     Chest wall: Tenderness present.  Abdominal:     Tenderness: There is no abdominal tenderness.  Musculoskeletal:        General: No tenderness.  Skin:    General: Skin is warm.     Capillary Refill: Capillary refill takes less than 2 seconds.  Neurological:     Mental Status: He is alert and oriented to person, place, and time.     ED Results / Procedures / Treatments   Labs (all labs ordered are listed, but only abnormal results are displayed) Labs Reviewed  CULTURE, BLOOD (ROUTINE X 2)  CULTURE, BLOOD (ROUTINE X 2)  SARS CORONAVIRUS 2 (TAT 6-24 HRS)  LACTIC ACID, PLASMA  LACTIC ACID, PLASMA  LACTATE DEHYDROGENASE  PROCALCITONIN  URINALYSIS, ROUTINE W REFLEX MICROSCOPIC  POC SARS CORONAVIRUS 2 AG -  ED    EKG None  Radiology DG Chest Portable 1 View  Result Date: 08/16/2019 CLINICAL DATA:  Fever, cough EXAM: PORTABLE CHEST 1 VIEW COMPARISON:  Chest radiograph dated 04/23/2019 FINDINGS: The heart remains enlarged with a left ventricular assist device in unchanged position. Median sternotomy wires are redemonstrated. Mild right basilar atelectasis/airspace disease is noted. The left lung is clear. There is no pleural effusion or pneumothorax. The visualized skeletal structures are unremarkable. IMPRESSION: 1. Mild right basilar atelectasis/airspace disease. 2. Unchanged cardiomegaly with left ventricular assist device. Electronically Signed   By: 14/11/2018 M.D.   On: 08/16/2019 15:41    Procedures Procedures (including critical care time)  Medications Ordered in ED Medications  vancomycin (VANCOREADY) IVPB 2000 mg/400 mL (has no administration in time  range)  ceFEPIme (MAXIPIME) 2 g in sodium chloride 0.9 % 100 mL IVPB (has no administration in time range)  vancomycin (VANCOREADY) IVPB 1250 mg/250 mL (has no administration in time range)    ED Course  I have reviewed the triage vital signs and the nursing notes.  Pertinent labs & imaging results that were available during my care of  the patient were reviewed by me and considered in my medical decision making (see chart for details).    MDM Rules/Calculators/A&P                      Patient with fever and elevated white count.  Has an elevated.  Sent in by LVAD team.  X-ray showed possible pneumonia.  Has had a cough.  Cardiology is ordered antibiotics and CT of the chest.  Covid test has been added.  Primary team is managing volume status. Final Clinical Impression(s) / ED Diagnoses Final diagnoses:  LVAD (left ventricular assist device) present [facility])  Community acquired pneumonia, unspecified laterality    Rx / DC Orders ED Discharge Orders    None       Benjiman Core, MD 08/16/19 1611

## 2019-08-16 NOTE — ED Triage Notes (Signed)
Patient presents to the ED with from his heart failure clinic Reports that his WBC is increasing PT report cough X 1 week Denies fever, body aches, chills

## 2019-08-16 NOTE — Progress Notes (Signed)
CSW met with patient in the clinic. Patient shared he has a meeting at the end of the month with his lawyer regarding the appeal with SSI. Patient has been waiting for a determination from disability for almost 2 years. Patient and Mom asked appropriate questions and discussed plan. CSW offered support and encouraged patient to seek assistance form his lawyer on next steps. Patient asked for a gas card as he is struggling financially with multiple clinic visits. CSW continues to follow for supportive needs as identified. [name], [place], [place]

## 2019-08-16 NOTE — Progress Notes (Signed)
Pharmacy Antibiotic Note  Jonathan Wood is a 49 y.o. male admitted on 08/16/2019 with pneumonia.  Pharmacy has been consulted for vancomycin and cefepime dosing.  WBC 27.3, temp 101.3. Scr 1.43 (CrCl 83 mL/min). CXR showing mild R basilar atelectasis/airspace disease.   Plan:  Vancomycin 2g IV then 1250 mg IV every 12 hours (estAUC 494)  Cefepime 2g IV every 8 hours Monitor renal fx, cx results, clinical pic, and vanc levels as prn  Height: 6\' 2"  (188 cm) Weight: 105 kg (231 lb 7.7 oz) IBW/kg (Calculated) : 82.2  Temp (24hrs), Avg:100.7 F (38.2 C), Min:100 F (37.8 C), Max:101.3 F (38.5 C)  Recent Labs  Lab 08/13/19 1101 08/16/19 1230  WBC 19.1* 27.3*  CREATININE 1.20 1.43*    Estimated Creatinine Clearance: 83.4 mL/min (A) (by C-G formula based on SCr of 1.43 mg/dL (H)).    No Known Allergies  Antimicrobials this admission: Vancomcyin 4/1 >>  Cefepime 4/1 >>   Dose adjustments this admission: N/A  Microbiology results: 4/1 BCx: sent 4/1 COVID PCR: sent  Thank you for allowing pharmacy to be a part of this patient's care.  6/1, PharmD, BCCCP Clinical Pharmacist  Phone: [phone]  Please check AMION for all [facility] Pharmacy phone numbers After 10:00 PM, call Main Pharmacy [phone] 08/16/2019 4:06 PM

## 2019-08-16 NOTE — H&P (Signed)
VAD Team H&P Note   Date:  08/16/2019   ID:  Jonathan Wood, DOB Sep 11, 1970, MRN [id number]  Location: Home  Provider location: [place] [facility] Type of Visit: Established patient  PCP: [name]  Cardiologist:  Arvilla Meres, MD Primary HF: Dr [name]   Reason for admission: Fever, leukocytosis -> suspect driveline infection   History of Present Illness:  Jonathan Wood is a 49 y.o. male with a history of chronic systolic due to NICM with EF 10%, Jonathan Wood, Jonathan Wood, Jonathan Wood.  Admitted 08/21/2018 with volume overload and anemia. Diuresed with IV lasix and transitioned to torsemide 20 mg daily. GI consulted EGD completed and showed gastritis and nonbleeding duodenal ulcers, colonoscopy with 5 polyps removed. Protonix increased to BID, he was started on PO iron, and hydrocortisone suppositories for hemorrhoid treatment. He received a total of 2 uPRBCs. He will stay off ASA for several weeks but can reconsider later.  Discharge weight was 221 pounds.   Admitted 12/7-15/20 for DL infection with overlying cellulitis and volume overload. Treated with IV abx and diuresis.   Seen in Clinic last week. Felt ok. Weight down. Prealbumin low. AF but WBC 19k. Brought back for labs today in Clinic. Overall felt ok. Denied fevers or chills. Said chest was sore from lifting things. Labs back WBC 27K. We contacted him and told him to come to ER. In ER febrile to 101.3. Rapid COVID negative  VAD Indication: Destination Therapy   VAD interrogation & Equipment Management:  Speed: 5600 Flow:  5.3 Power:  4.3w PI:  3.1  Alarms: multiple LOW VOLTAGE alarms; several NO EXTERNAL POWER alarms Events: 0 - 5 PI events daily  Fixed speed 5600 Low speed limit: 5300  Primary Controller: Replace back up battery in 19 months.  Back up controller: Replace back up battery in 11 months.   Past Medical History:    Diagnosis Date  . Asthma   . CHF (congestive heart failure) (HCC)    a. 09/2016: EF 20-25% with cath showing normal cors  . GERD (gastroesophageal reflux disease)   . History of hiatal hernia   . OSA on CPAP 09/06/2018   Severe OSA with AHI 68/hr on CPAP at 12cm H2O   Past Surgical History:  Procedure Laterality Date  . BIOPSY  08/23/2018   Procedure: BIOPSY;  Surgeon: Meridee Score Netty Starring., MD;  Location[facility] ENDOSCOPY;  Service: Gastroenterology;;  . COLONOSCOPY N/A 08/23/2018   Procedure: COLONOSCOPY;  Surgeon: Lemar Lofty., MD;  Location: [facility] ENDOSCOPY;  Service: Gastroenterology;  Laterality: N/A;  . ENTEROSCOPY N/A 08/23/2018   Procedure: ENTEROSCOPY;  Surgeon: Meridee Score Netty Starring., MD;  [facility] ENDOSCOPY;  Service: Gastroenterology;  Laterality: N/A;  . HEMOSTASIS CLIP PLACEMENT  08/23/2018   Procedure: HEMOSTASIS CLIP PLACEMENT;  Surgeon: Lemar Lofty., MD;  Location: [facility] ENDOSCOPY;  Service: Gastroenterology;;  . IABP INSERTION N/A 09/05/2017   Procedure: IABP INSERTION;  Surgeon: Dolores Patty, MD;  Location: [facility] INVASIVE CV LAB;  Service: Cardiovascular;  Laterality: N/A;  . INSERTION OF IMPLANTABLE LEFT VENTRICULAR ASSIST DEVICE N/A 09/06/2017   Procedure: INSERTION OF IMPLANTABLE LEFT VENTRICULAR ASSIST DEVICE - HM3;  Surgeon: Kerin Perna, MD;  Location: [facility] OR;  Service: Open Heart Surgery;  Laterality: N/A;  HM3  . NASAL FRACTURE SURGERY  1987  . POLYPECTOMY  08/23/2018   Procedure: POLYPECTOMY;  Surgeon: Mansouraty, Netty Starring., MD;  Location: [facility] ENDOSCOPY;  Service:  Gastroenterology;;  . RIGHT HEART CATH N/A 08/30/2017   Procedure: RIGHT HEART CATH;  Surgeon: Jolaine Artist, MD;  Location: [place] CV LAB;  Service: Cardiovascular;  Laterality: N/A;  . RIGHT/LEFT HEART CATH AND CORONARY ANGIOGRAPHY N/A 10/08/2016   Procedure: Right/Left Heart Cath and Coronary Angiography;  Surgeon: Dixie Dials, MD;  Location: [place] CV LAB;   Service: Cardiovascular;  Laterality: N/A;  . SUBMUCOSAL TATTOO INJECTION  08/23/2018   Procedure: SUBMUCOSAL TATTOO INJECTION;  Surgeon: Irving Copas., MD;  Location: [place];  Service: Gastroenterology;;  . TEE WITHOUT CARDIOVERSION N/A 09/06/2017   Procedure: TRANSESOPHAGEAL ECHOCARDIOGRAM (TEE);  Surgeon: Prescott Gum, Collier Salina, MD;  Location: [place];  Service: Open Heart Surgery;  Laterality: N/A;     Current Facility-Administered Medications  Medication Dose Route Frequency Provider Last Rate Last Admin  . ceFEPIme (MAXIPIME) 2 g in sodium chloride 0.9 % 100 mL IVPB  2 g Intravenous Q8H Erine Phenix, Shaune Pascal, MD      . Derrill Memo ON 08/17/2019] vancomycin (VANCOREADY) IVPB 1250 mg/250 mL  1,250 mg Intravenous Q12H Lam Bjorklund, Shaune Pascal, MD      . vancomycin (VANCOREADY) IVPB 2000 mg/400 mL  2,000 mg Intravenous Once Cassy Sprowl, Shaune Pascal, MD       Current Outpatient Medications  Medication Sig Dispense Refill  . citalopram (CELEXA) 10 MG tablet Take 1 tablet (10 mg total) by mouth daily. 90 tablet 5  . docusate sodium (COLACE) 100 MG capsule Take 1 capsule (100 mg total) by mouth 3 (three) times daily. 90 capsule 2  . gabapentin (NEURONTIN) 300 MG capsule TAKE ONE CAPSULE BY MOUTH THREE TIMES DAILY (Patient taking differently: Take 300 mg by mouth 3 (three) times daily. ) 90 capsule 6  . hydrALAZINE (APRESOLINE) 50 MG tablet Take 1 tablet (50 mg total) by mouth 3 (three) times daily. 90 tablet 3  . hydrOXYzine (ATARAX/VISTARIL) 25 MG tablet Take 25 mg by mouth every 8 (eight) hours as needed for anxiety.    Marland Kitchen linaclotide (LINZESS) 145 MCG CAPS capsule Take 1 capsule (145 mcg total) by mouth daily before breakfast. (Patient taking differently: Take 145 mcg by mouth every other day. ) 30 capsule 4  . pantoprazole (PROTONIX) 40 MG tablet Take 1 tablet (40 mg total) by mouth 2 (two) times daily. 60 tablet 6  . potassium chloride SA (KLOR-CON M20) 20 MEQ tablet Take 1 tablet (20 mEq total) by mouth  daily. 30 tablet 6  . sildenafil (REVATIO) 20 MG tablet Take 1 tablet (20 mg total) by mouth 3 (three) times daily. 90 tablet 5  . torsemide (DEMADEX) 20 MG tablet Take 1 tablet (20 mg total) by mouth every other day. May take extra if needed 45 tablet 3  . warfarin (COUMADIN) 5 MG tablet Take 7.5 mg (1.5 tablet) every Monday/Wednesday then 5 mg (1 tablet) all other days or as directed by HF clinic. (Patient taking differently: Take 5 mg by mouth See admin instructions. Take 7.5 mg (1.5 tablet) every Monday/Wednesday then 5 mg (1 tablet) all other days or as directed by HF clinic.) 180 tablet 3    Allergies:   Patient has no known allergies.   Social History:  The patient  reports that he has been smoking cigarettes. He has a 12.50 pack-year smoking history. He has never used smokeless tobacco. He reports current alcohol use. He reports that he does not use drugs.   Family History:  The patient's family history includes Hypertension in his father.  ROS:  Please see the history of present illness.   All other systems are personally reviewed and negative.     Exam:   General:  NAD.  HEENT: normal  Neck: supple. JVP not elevated.  Carotids 2+ bilat; no bruits. No lymphadenopathy or thryomegaly appreciated. Cor: LVAD hum.  Lungs: Clear. Abdomen: obese soft, nontender, non-distended. No hepatosplenomegaly. No bruits or masses. Good bowel sounds. Driveline site with mild drainage but nontender. Anchor in place.  Extremities: no cyanosis, clubbing, rash. Warm no edema  Neuro: alert & oriented x 3. No focal deficits. Moves all 4 without problem    Recent Labs: 04/23/2019: B Natriuretic Peptide 709.6 04/28/2019: Magnesium 1.8 08/13/2019: TSH 0.209 08/16/2019: ALT 32; BUN 24; Creatinine, Ser 1.43; Hemoglobin 11.8; Platelets 285; Potassium 4.2; Sodium 127    Wt Readings from Last 3 Encounters:  08/16/19 105 kg  08/16/19 104.7 kg  08/13/19 104.8 kg      ASSESSMENT AND PLAN:  1. 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 Chronic systolic HF - EF 16% s/p HM-3 LVAD on Wood - Volume status ok - NYHA III  3. HM3 LVAD 09/06/2017.  - VAD interrogated personally. Parameters stable. Multiple external power alarms. Discussed better VAD care.  - LDH 252 - INR 5.8 goal 2.0-2.5 Discussed with PharmD. Hold warfarin - Off ASA with PUD.   4. Essential Jonathan Wood - MAPs ok  5. AKI - suspect volume depletion - hydrate gently.   6. OSA - sleep study with very severe OSA (AHI 69/hr) - non-compliant  7. H/o GI bleed  - 08/2018 EGD completed and showed gastritis and nonbleeding duodenal ulcers, colonoscopy with 5 polyps removed. Protonix was increased to BID.  - No recent GI bleeding - hgb 10.9 -> 10.5 -> 12.9 -> 12.8 - He will stay off aspirin for now.   8. Tobacco Wood  - says he quit recently  9. Severe protein calorie malnutrition  - nutrition to see   Signed, Arvilla Meres, MD  08/16/2019 4:23 PM  [facility] [address] [facility] [place] [address] [phone] (office) [phone] (fax)

## 2019-08-16 NOTE — Progress Notes (Signed)
Patient presents for  f/u in VAD clinic today with his mom. Denies any issues with his VAD or alarms.   Pt arrived in w/c with his mother pushing him. When asked why he is in w/c he reports he is carrying loaner [employer] underneath.   Pt seen in VAD Clinic 3 days ago. Returned today for repeat labs and dressing change instruction for his mother.  Pt presents and is c/o acute pain left sternum and is requesting pain medication. Describes pain as "worse when I lie down, sit up from lying position, move, cough". Pt reports he had been moving heavy furniture (lifted couch and bed) prior to onset of pain. Dr. [name] in to assess; instructed pt to continue taking gabapentin 300 mg three times daily for pain. Pt reports he has "two full bottles" but hasn't been taking because "it doesn't help much".   Pt presents with DL dressing unattached and open, anchor not in place. Pt says they "keep falling off". He is carrying controller in left pants pocket and wearing battery holster for batteries. There is much tension on controller and batteries due to controller placement in pocket. Advised patient not to carry controller in pocket, to carry either on his belt or around his neck. Pt says he normally wears on his neck, just didn't "have time this am".  Dressing change demonstrated to mother who videoed for later review.   Vital Signs: Temp: 100   Doppler Pressure: 90 Automatc BP: UTO HR: 76 SPO2: 96 % RA  Weight: 230.8  lbs w/o eqt Last weight: 231 lb  VAD Indication: Destination Therapy   VAD interrogation & Equipment Management: Speed: 5600 Flow: 5.4 Power: 4.3w    PI: 2.8   Exit Site Care: Drive line is being maintained twice weekly by the pt. Current dressing is coming off, no anchor present, drive line is bent at an extreme angle from exit site. Discussed importance of drive line positioning for tissue ingrowth and to prevent lead fractures. Existing VAD dressing removed and site care  performed using sterile technique. Drive line exit site cleaned with Chlora prep applicators x 2, allowed to dry, and gauze dressing applied with silver strip. Drive line exit site incorporated, the velour is fully implanted at exit site. Scant amount of yellow drainage noted on silver strip, no erythema, tenderness, or foul odor noted.  Pt denies fever or chills. Continue twice weekly dressing changes. Pt informed that he CANNOT perform his own dressing change. Provided patient with 6 daily dressing kits and 8 anchors for home use.    Device:none  BP & Labs:  Doppler 90 - reflecting MAP  Hgb 11.8 - No S/S of bleeding. Specifically denies melena/BRBPR or nosebleeds.  LDH not done at today's visit;  established baseline of 200-300. Denies tea-colored urine. No power elevations noted on interrogation.   Patient Instructions:  1.Continue twice weekly dressing changes using daily kits and silver strip. Mother is to perform dressing changes. 2. Will call you if lab results remain abnormal after Dr. Prescott Gum review with any instructions from him. 3. Return to VAD Clinic in 1 month.  Hessie Diener RN VAD Coordinator  Office: [phone]  24/7 Pager: [phone]

## 2019-08-16 NOTE — Progress Notes (Signed)
ANTICOAGULATION CONSULT NOTE - Initial Consult  Pharmacy Consult for warfarin Indication: LVAD  No Known Allergies  Patient Measurements: Height: 6\' 2"  (188 cm) Weight: 105 kg (231 lb 7.7 oz) IBW/kg (Calculated) : 82.2 Heparin Dosing Weight: 103.4 kg   Vital Signs: Temp: 101.3 F (38.5 C) (04/01 1514) Temp Source: Oral (04/01 1514) BP: 90/0 (04/01 1514) Pulse Rate: 119 (04/01 1514)  Labs: Recent Labs    08/16/19 1230  HGB 11.8*  HCT 37.1*  PLT 285  LABPROT 52.3*  INR 5.8*  CREATININE 1.43*    Estimated Creatinine Clearance: 83.4 mL/min (A) (by C-G formula based on SCr of 1.43 mg/dL (H)).   Medical History: Past Medical History:  Diagnosis Date  . Asthma   . CHF (congestive heart failure) (HCC)    a. 09/2016: EF 20-25% with cath showing normal cors  . GERD (gastroesophageal reflux disease)   . History of hiatal hernia   . OSA on CPAP 09/06/2018   Severe OSA with AHI 68/hr on CPAP at 12cm H2O    Assessment: 48 yom presenting with elevated WBC, concern for PNA.   INR on admission today is 5.8. Hgb 11.8, plt 285. LDH 252. No s/sx of bleeding documented in notes.   PTA regimen is 5 mg daily except 7.5 mg on Mon/Wed.   Goal of Therapy:  INR 2-2.5 Monitor platelets by anticoagulation protocol: Yes   Plan:  Hold warfarin tonight Monitor daily INR, CBC, and for s/sx of bleeding  09/08/2018, PharmD, BCCCP Clinical Pharmacist  Phone: [phone]  Please check AMION for all [facility] Pharmacy phone numbers After 10:00 PM, call Main Pharmacy [phone] 08/16/2019,4:39 PM

## 2019-08-16 NOTE — Addendum Note (Signed)
Encounter addended by: Levonne Spiller, RN on: 08/16/2019 1:27 PM  Actions taken: Vitals modified, Order Reconciliation Section accessed, Home Medications modified, Medication List reviewed, Medication taking status modified, Pend clinical note

## 2019-08-16 NOTE — Telephone Encounter (Signed)
Called patient per Dr. [name]. WBC count elevated to 27,000. Explained significance to patient and instructed him to return to ED. Rest of lab results pending.  Told patient he may need to be admitted following w/u in ED. Pt verbalized understanding of same, asking "how long?". Informed him I do not know at this time, but it is important he come in for evaluation.  Hessie Diener RN, VAD Coordinator [phone]

## 2019-08-16 NOTE — Progress Notes (Signed)
LVAD Coordinator ED Encounter  Jonathan Wood a 49 y.o. male that presented to [facility] ER today due to elevated WBC count. He  has a past medical history of Asthma, CHF (congestive heart failure) (HCC), GERD (gastroesophageal reflux disease), History of hiatal hernia, and OSA on CPAP (09/06/2018).  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 He is managed by Dr. [name], AHF Cardiologist.   Pt seen in clinic Monday of this week with elevated WBC, LFTs, and low prealbumin. He presented to clinic today for repeat labs and VAD drive line dressing change.   Pt had low grade fever 100.0 and cough at dressing change visit. He is c/o pain left sternum after lifting heavy furniture. He has not had Covid vaccine and denies any recent exposure to positive Covid patients.   Pt was advised to return to ED for w/u of elevated WBC with possible admission. VAD cart delivered to room 34 in ED.    Vital signs: Temp: 101.3 HR: 119 Doppler MAP:  90/0 O2 Sat: 90%  LVAD interrogation reveals:  Speed: 5600 Flow:  5.3 Power:  4.3w PI:  3.1  Alarms: multiple LOW VOLTAGE alarms; several NO EXTERNAL POWER alarms Events: 0 - 5 PI events daily  Drive Line:  Left abdominal DL gauze dressing C/D/I; anchor intact and accurately applied.   Updated VAD Dr. [name] about the above. No LVAD issues and pump is functioning as expected. Able to independently manage LVAD equipment. No LVAD needs at this time.    Hessie Diener, RN VAD Coordinator   Office: [phone] 24/7 Emergency VAD Pager: [phone]

## 2019-08-16 NOTE — Addendum Note (Signed)
Encounter addended by: Levonne Spiller, RN on: 08/16/2019 2:43 PM  Actions taken: Clinical Note Signed

## 2019-08-16 NOTE — Addendum Note (Signed)
Encounter addended by: Marcy Siren, LCSW on: 08/16/2019 2:34 PM  Actions taken: Clinical Note Signed

## 2019-08-16 NOTE — Addendum Note (Signed)
Encounter addended by: Chinita Pester, CMA on: 08/16/2019 11:47 AM  Actions taken: Visit diagnoses modified, Order list changed, Diagnosis association updated

## 2019-08-17 ENCOUNTER — Inpatient Hospital Stay (HOSPITAL_COMMUNITY): Payer: Medicare Other

## 2019-08-17 DIAGNOSIS — I361 Nonrheumatic tricuspid (valve) insufficiency: Secondary | ICD-10-CM

## 2019-08-17 DIAGNOSIS — I340 Nonrheumatic mitral (valve) insufficiency: Secondary | ICD-10-CM

## 2019-08-17 DIAGNOSIS — A4189 Other specified sepsis: Secondary | ICD-10-CM

## 2019-08-17 LAB — BLOOD CULTURE ID PANEL (REFLEXED)

## 2019-08-17 LAB — CBC
HCT: 35.5 % — ABNORMAL LOW (ref 39.0–52.0)
Hemoglobin: 11 g/dL — ABNORMAL LOW (ref 13.0–17.0)
MCH: 27.8 pg (ref 26.0–34.0)
MCHC: 31 g/dL (ref 30.0–36.0)
MCV: 89.9 fL (ref 80.0–100.0)
Platelets: 284 10*3/uL (ref 150–400)
RBC: 3.95 MIL/uL — ABNORMAL LOW (ref 4.22–5.81)
RDW: 15.7 % — ABNORMAL HIGH (ref 11.5–15.5)
WBC: 26.2 10*3/uL — ABNORMAL HIGH (ref 4.0–10.5)
nRBC: 0.2 % (ref 0.0–0.2)

## 2019-08-17 LAB — PROTIME-INR
INR: 4.5 (ref 0.8–1.2)
Prothrombin Time: 42.5 seconds — ABNORMAL HIGH (ref 11.4–15.2)

## 2019-08-17 LAB — BASIC METABOLIC PANEL
Anion gap: 12 (ref 5–15)
BUN: 31 mg/dL — ABNORMAL HIGH (ref 6–20)
CO2: 20 mmol/L — ABNORMAL LOW (ref 22–32)
Calcium: 8.4 mg/dL — ABNORMAL LOW (ref 8.9–10.3)
Chloride: 95 mmol/L — ABNORMAL LOW (ref 98–111)
Creatinine, Ser: 1.55 mg/dL — ABNORMAL HIGH (ref 0.61–1.24)
GFR calc Af Amer: >60 mL/min (ref >60)
GFR calc non Af Amer: 53 mL/min — ABNORMAL LOW (ref >60)
Glucose, Bld: 127 mg/dL — ABNORMAL HIGH (ref 70–99)
Potassium: 4.7 mmol/L (ref 3.5–5.1)
Sodium: 127 mmol/L — ABNORMAL LOW (ref 135–145)

## 2019-08-17 LAB — LACTATE DEHYDROGENASE: LDH: 227 U/L — ABNORMAL HIGH (ref 98–192)

## 2019-08-17 LAB — ECHOCARDIOGRAM COMPLETE
Height: 74 in
Weight: 3640.24 oz

## 2019-08-17 MED ORDER — MUSCLE RUB 10-15 % EX CREA
TOPICAL_CREAM | CUTANEOUS | Status: DC | PRN
  Filled 2019-08-17: qty 85

## 2019-08-17 MED ORDER — MELATONIN 3 MG PO TABS
3.0000 mg | ORAL_TABLET | Freq: Once | ORAL | Status: AC
  Administered 2019-08-17: 3 mg via ORAL
  Filled 2019-08-17: qty 1

## 2019-08-17 MED ORDER — DULOXETINE HCL 30 MG PO CPEP
30.0000 mg | ORAL_CAPSULE | Freq: Every day | ORAL | Status: DC
  Administered 2019-08-18 – 2019-09-08 (×18): 30 mg via ORAL
  Filled 2019-08-17 (×21): qty 1

## 2019-08-17 MED ORDER — ADULT MULTIVITAMIN W/MINERALS CH
1.0000 | ORAL_TABLET | Freq: Every day | ORAL | Status: DC
  Administered 2019-08-17 – 2019-09-13 (×24): 1 via ORAL
  Filled 2019-08-17 (×23): qty 1

## 2019-08-17 MED ORDER — ENSURE ENLIVE PO LIQD
237.0000 mL | Freq: Three times a day (TID) | ORAL | Status: DC
  Administered 2019-08-17 – 2019-09-08 (×25): 237 mL via ORAL

## 2019-08-17 MED ORDER — SODIUM CHLORIDE 0.9 % IV SOLN
2.0000 g | Freq: Four times a day (QID) | INTRAVENOUS | Status: DC
  Administered 2019-08-17 – 2019-08-18 (×3): 2 g via INTRAVENOUS
  Filled 2019-08-17 (×6): qty 2000

## 2019-08-17 MED ORDER — VANCOMYCIN HCL 750 MG/150ML IV SOLN
750.0000 mg | Freq: Two times a day (BID) | INTRAVENOUS | Status: DC
  Administered 2019-08-17: 750 mg via INTRAVENOUS
  Filled 2019-08-17 (×2): qty 150

## 2019-08-17 MED ORDER — CHLORHEXIDINE GLUCONATE CLOTH 2 % EX PADS
6.0000 | MEDICATED_PAD | Freq: Every day | CUTANEOUS | Status: DC
  Administered 2019-08-17 – 2019-09-12 (×19): 6 via TOPICAL

## 2019-08-17 NOTE — Progress Notes (Signed)
The chaplain visited with the patient as a result of a consult for an Advanced Directive. The patient declined information regarding advanced directives. The chaplain does not have a need to follow-up.  Lavone Neri Chaplain Resident For questions concerning this note please contact me by pager [phone]

## 2019-08-17 NOTE — Progress Notes (Signed)
Initial Nutrition Assessment  DOCUMENTATION CODES:   Not applicable  INTERVENTION:   Ensure Enlive po TID, each supplement provides 350 kcal and 20 grams of protein  Add MVI with Minerals  NUTRITION DIAGNOSIS:   Inadequate oral intake related to decreased appetite as evidenced by per patient/family report.  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 PMH includes NICM with EF 10%, LVAD, HTN, EtOH abuse   RD working remotely.  Pt is hungry, good appetite at present. No recorded po intake.   Current weight 103.2 kg. Non-pitting generalized edema; unsure of true EDW  Plan to perform Nutrition focused physical exam on follow-up. Unable to determine if pt meets clinical characteristics for malnutrition at this time  Labs: sodium 127 (L), Creatinine 1.55 Meds: ferrous sulfate   Diet Order:   Diet Order            Diet Heart Room service appropriate? Yes; Fluid consistency: Thin  Diet effective now              EDUCATION NEEDS:   Not appropriate for education at this time  Skin:  Skin Assessment: Reviewed RN Assessment  Last BM:  no documented BM  Height:   Ht Readings from Last 1 Encounters:  08/16/19 6\' 2"  (1.88 m)    Weight:   Wt Readings from Last 1 Encounters:  08/17/19 103.2 kg   BMI:  Body mass index is 29.21 kg/m.  Estimated Nutritional Needs:   Kcal:  2200-2400 kcals  Protein:  115-130 g  Fluid:  >/= 1.8 L   10/17/19 MS, RDN, LDN, CNSC RD Pager Number and Weekend/On-Call After Hours Pager Located in [place]

## 2019-08-17 NOTE — Progress Notes (Addendum)
LVAD Coordinator Rounding Note:  Admitted 08/16/19 by Dr. [name] due to VAD drive line infection/pneumonia/sepsis  HM III LVAD implanted on 09/06/17 by Dr. Cyndia Bent under Destination Therapy criteria.  Seen in Clinic Monday. Felt ok. Weight down. Prealbumin low. AF but WBC 19k. Brought back for labs yesterday in Clinic. Overall felt ok. Denied fevers or chills. Said chest was sore from lifting things. Labs back WBC 27K. We contacted him and told him to come to ER. In ER febrile to 101.3. Rapid COVID negative.  Pt asleep in bed. When awakened pt states that he is hungry. Denies any pain. Placed heart healthy diet order per Dr. Prescott Gum    Vital signs: Temp: 98.7 HR: 109 Doppler Pressure: 76 Automatic BP: UTO O2 Sat: 100% RA Wt: 227.5  lbs   LVAD interrogation reveals:  Speed: 5600 Flow: 5.6 Power:  4.3 PI: 2.8 Alarms: pt had multiple low voltage, no external powers on 4/1 and 3/31. Pt has not had any alarms since admission. Pt denies hearing any of these alarms although they registered on his controller as well. I suspect this may be an issue with his MPU, as all the alarms occurred while pt was tethered at home. We will observe while inpatient to see if this occurs while connected to hospital PM. Events: none since admission Hematocrit: 34  Fixed speed: 5600 Low speed limit: 5300  Drive Line: CDI. Twice weekly dressing changes using daily kit w/siliver strip by bedside nurse. Next dressing change due 08/20/19.  Labs:  LDH trend: 227  INR trend: 4.5  WBC: 27.3>26.2  Anticoagulation Plan: -INR Goal: 2.0 - 2.5  -ASA Dose: none  Adverse Events: - driveline cx 04/16/61>> staph aureus  Plan/Recommendations:  1. Call VAD pager if any VAD equipment or drive line issues. 2. Twice weekly dressing changes by bedside nurse. Next dressing change due 08/20/19.  [name] RN, [place] Coordinator [phone]

## 2019-08-17 NOTE — Progress Notes (Signed)
PHARMACY - PHYSICIAN COMMUNICATION CRITICAL VALUE ALERT - BLOOD CULTURE IDENTIFICATION (BCID)  Jonathan Wood is an 49 y.o. male who presented to [facility] on 08/16/2019 with a chief complaint of fever and leukocytosis   Assessment:  4/4 BCx growing GPCs in clusters. BCID shows MSSA.    Name of physician (or Provider) Contacted: Dr. Charna Busman  Current antibiotics: Cefepime and vancomycin   Changes to prescribed antibiotics recommended: Switch to nafcillin  Recommendations accepted by provider  Results for orders placed or performed during the hospital encounter of 08/16/19  Blood Culture ID Panel (Reflexed) (Collected: 08/16/2019  3:41 PM)  Result Value Ref Range   Enterococcus species NOT DETECTED NOT DETECTED   Listeria monocytogenes NOT DETECTED NOT DETECTED   Staphylococcus species DETECTED (A) NOT DETECTED   Staphylococcus aureus (BCID) DETECTED (A) NOT DETECTED   Methicillin resistance NOT DETECTED NOT DETECTED   Streptococcus species NOT DETECTED NOT DETECTED   Streptococcus agalactiae NOT DETECTED NOT DETECTED   Streptococcus pneumoniae NOT DETECTED NOT DETECTED   Streptococcus pyogenes NOT DETECTED NOT DETECTED   Acinetobacter baumannii NOT DETECTED NOT DETECTED   Enterobacteriaceae species NOT DETECTED NOT DETECTED   Enterobacter cloacae complex NOT DETECTED NOT DETECTED   Escherichia coli NOT DETECTED NOT DETECTED   Klebsiella oxytoca NOT DETECTED NOT DETECTED   Klebsiella pneumoniae NOT DETECTED NOT DETECTED   Proteus species NOT DETECTED NOT DETECTED   Serratia marcescens NOT DETECTED NOT DETECTED   Haemophilus influenzae NOT DETECTED NOT DETECTED   Neisseria meningitidis NOT DETECTED NOT DETECTED   Pseudomonas aeruginosa NOT DETECTED NOT DETECTED   Candida albicans NOT DETECTED NOT DETECTED   Candida glabrata NOT DETECTED NOT DETECTED   Candida krusei NOT DETECTED NOT DETECTED   Candida parapsilosis NOT DETECTED NOT DETECTED   Candida tropicalis NOT DETECTED NOT  DETECTED    Vinnie Level, PharmD., BCPS Clinical Pharmacist

## 2019-08-17 NOTE — Progress Notes (Addendum)
ANTICOAGULATION CONSULT NOTE - Initial Consult  Pharmacy Consult for warfarin Indication: LVAD  No Known Allergies  Patient Measurements: Height: 6\' 2"  (188 cm) Weight: 103.2 kg (227 lb 8.2 oz)(standing scale w/ underwear & HM3 controller) IBW/kg (Calculated) : 82.2 Heparin Dosing Weight: 103.4 kg   Vital Signs: Temp: 98.7 F (37.1 C) (04/02 0800) Temp Source: Oral (04/02 0800) Pulse Rate: 109 (04/02 0800)  Labs: Recent Labs    08/16/19 1230 08/17/19 0302  HGB 11.8* 11.0*  HCT 37.1* 35.5*  PLT 285 284  LABPROT 52.3* 42.5*  INR 5.8* 4.5*  CREATININE 1.43* 1.55*    Estimated Creatinine Clearance: 76.3 mL/min (A) (by C-G formula based on SCr of 1.55 mg/dL (H)).   Medical History: Past Medical History:  Diagnosis Date  . Asthma   . CHF (congestive heart failure) (HCC)    a. 09/2016: EF 20-25% with cath showing normal cors  . GERD (gastroesophageal reflux disease)   . History of hiatal hernia   . OSA on CPAP 09/06/2018   Severe OSA with AHI 68/hr on CPAP at 12cm H2O    Assessment: 48 yom presenting with elevated WBC, concern for PNA.   INR today trending down to 5.8 to 4.5. Hgb 11, plt 284. LDH 227. Received vitamin K PO yesterday (peak effect 24-48 hours) - anticipate trending down further.  No s/sx of bleeding documented in notes.   PTA regimen is 5 mg daily except 7.5 mg on Mon/Wed.   Goal of Therapy:  INR 2-2.5 Monitor platelets by anticoagulation protocol: Yes   Plan:  Hold warfarin tonight Could consider ensure supplement to assist in INR trend  Monitor daily INR, CBC, and for s/sx of bleeding  09/08/2018, PharmD, BCCCP Clinical Pharmacist  Phone: [phone]  Please check AMION for all [facility] Pharmacy phone numbers After 10:00 PM, call Main Pharmacy [phone] 08/17/2019,10:15 AM

## 2019-08-17 NOTE — Progress Notes (Signed)
  Echocardiogram 2D Echocardiogram has been performed.  Jonathan Wood 08/17/2019, 10:43 AM

## 2019-08-17 NOTE — Progress Notes (Signed)
Pharmacy Antibiotic Note  Jonathan Wood is a 49 y.o. male admitted on 08/16/2019 with pneumonia.  Pharmacy has been consulted for vancomycin and cefepime dosing.  WBC 26, temp improving now afebrile. Scr continues to increase slightly now 1.55 (CrCl 58mL/min). PCT 3.04. LA 2.  Plan:  Change to  Vancomycin 750 mg IV every 12 hours (estAUC 470)  Cefepime 2g IV every 8 hours Monitor renal fx, cx results, clinical pic, and vanc levels as prn  Height: 6\' 2"  (188 cm) Weight: 103.2 kg (227 lb 8.2 oz)(standing scale w/ underwear & HM3 controller) IBW/kg (Calculated) : 82.2  Temp (24hrs), Avg:99.7 F (37.6 C), Min:97.5 F (36.4 C), Max:101.5 F (38.6 C)  Recent Labs  Lab 08/13/19 1101 08/16/19 1230 08/16/19 1544 08/17/19 0302  WBC 19.1* 27.3*  --  26.2*  CREATININE 1.20 1.43*  --  1.55*  LATICACIDVEN  --   --  2.0*  --     Estimated Creatinine Clearance: 76.3 mL/min (A) (by C-G formula based on SCr of 1.55 mg/dL (H)).    No Known Allergies  Antimicrobials this admission: Vancomcyin 4/1 >>  Cefepime 4/1 >>   Dose adjustments this admission: N/A  Microbiology results: 4/1 BCx: sent 4/1 COVID PCR: sent  Thank you for allowing pharmacy to be a part of this patient's care.  6/1, PharmD, BCCCP Clinical Pharmacist  Phone: [phone]  Please check AMION for all [facility] Pharmacy phone numbers After 10:00 PM, call Main Pharmacy [phone] 08/17/2019 10:21 AM

## 2019-08-17 NOTE — Progress Notes (Signed)
  Subjective: Patient examined, images of CT scan of chest and echocardiogram personally reviewed. History of HeartMate 3 admitted with elevated white count and minimal drainage from driveline.  Cultures so far unrevealing.  Patient has been afebrile since admission.  The CT scan shows probable postoperative changes with minimal pannus along driveline and filling space created by decompressing the left ventricle.  There is no previous postoperative CT scan with which to compare.  These areas do not look infected.  Pulmonary windows show bilateral large blebs and infiltrate right lower lobe  Would recommend IV antibiotics and follow-up on cultures.  Objective: Vital signs in last 24 hours: Temp:  [97.5 F (36.4 C)-101.5 F (38.6 C)] 97.8 F (36.6 C) (04/02 1200) Pulse Rate:  [30-119] 36 (04/02 1200) Cardiac Rhythm: Sinus tachycardia (04/02 0800) Resp:  [25-37] 36 (04/02 1200) BP: (90-91)/(0-64) 91/64 (04/02 1200) SpO2:  [90 %-100 %] 95 % (04/02 1200) Weight:  [103.2 kg-105 kg] 103.2 kg (04/02 0500)  Hemodynamic parameters for last 24 hours:    Intake/Output from previous day: 04/01 0701 - 04/02 0700 In: 309.8 [I.V.:209; IV Piggyback:100.8] Out: 550 [Urine:550] Intake/Output this shift: Total I/O In: 1250.1 [I.V.:1033.8; IV Piggyback:216.3] Out: 300 [Urine:300]  Normal VAD hum Driveline dressing dry  Lab Results: Recent Labs    08/16/19 1230 08/17/19 0302  WBC 27.3* 26.2*  HGB 11.8* 11.0*  HCT 37.1* 35.5*  PLT 285 284   BMET:  Recent Labs    08/16/19 1230 08/17/19 0302  NA 127* 127*  K 4.2 4.7  CL 95* 95*  CO2 18* 20*  GLUCOSE 188* 127*  BUN 24* 31*  CREATININE 1.43* 1.55*  CALCIUM 8.7* 8.4*    PT/INR:  Recent Labs    08/17/19 0302  LABPROT 42.5*  INR 4.5*   ABG    Component Value Date/Time   PHART 7.405 09/08/2017 0049   HCO3 23.4 09/08/2017 0049   TCO2 32 09/12/2017 1605   ACIDBASEDEF 1.0 09/08/2017 0049   O2SAT 60.9 09/19/2017 0445   CBG  (last 3)  No results for input(s): GLUCAP in the last 72 hours.  Assessment/Plan: S/P HeartMate 3 implantation IV antibiotics and observation   LOS: 1 day    Jonathan Wood 08/17/2019

## 2019-08-17 NOTE — Progress Notes (Addendum)
Advanced Heart Failure Rounding Note  PCP-Cardiologist: Arvilla Meres, MD    Patient Profile   49 y.o. 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  Subjective:    Tepm 101.5 on admit. No recurrent fevers overnight. WBC down slightly, 27>>26K. Blood cultures pending. On vanc + cefepime.   Per Dr. Donata Clay, findings on CT of C/A/P most c/w post operative changes and not infection. No plans for OR today.   Continues to endorse pain above drive line site. ? Pocket pain.    VAD Interrogation: Speed:5600, Flow:5.4, Power:4.3, PI:2.9, 3 PI events   CT of C/A/P 08/16/19 IMPRESSION: 1. New fluid collections along the efferent tubing from the ventricular cyst device to the ascending aorta. Findings are suspicious for infection given clinical presentation and elevated white blood cell count. 2. Stable fluid surrounding the line within the left upper quadrant anterior abdominal wall, with decreased adjacent fat stranding. Infection in this region cannot be excluded. 3. Stable cardiomegaly. 4. Stable emphysema. 5. Otherwise no acute intra-abdominal or intrapelvic process.  Objective:   Weight Range: 103.2 kg Body mass index is 29.21 kg/m.   Vital Signs:   Temp:  [97.5 F (36.4 C)-101.5 F (38.6 C)] 98.7 F (37.1 C) (04/02 0800) Pulse Rate:  [35-119] 109 (04/02 0800) Resp:  [25-37] 29 (04/02 0800) BP: (90)/(0) 90/0 (04/01 1514) SpO2:  [90 %-100 %] 100 % (04/02 0800) Weight:  [103.2 kg-105 kg] 103.2 kg (04/02 0500) Last BM Date: (PTA)  Weight change: Filed Weights   08/16/19 1515 08/17/19 0500  Weight: 105 kg 103.2 kg    Intake/Output:   Intake/Output Summary (Last 24 hours) at 08/17/2019 1028 Last data filed at 08/17/2019 0800 Gross per 24 hour  Intake 1154.55 ml  Output 550 ml  Net 604.55 ml       Physical Exam    General:  Fatigue appearing. No resp difficulty HEENT: Normal Neck: Supple. No JVP . Carotids 2+ bilat; no bruits. No lymphadenopathy or thyromegaly appreciated. Cor: + LVAD Hum  Lungs: Clear Abdomen: tense/ mildly distended, mild LUQ tenderness w/ palpation  Drive Line Site: covered in clean/dry dressing Extremities: No cyanosis, clubbing, rash, edema Neuro: Alert & orientedx3, cranial nerves grossly intact. moves all 4 extremities w/o difficulty. Affect pleasant   Telemetry   NSR 90s   Labs    CBC Recent Labs    08/16/19 1230 08/17/19 0302  WBC 27.3* 26.2*  HGB 11.8* 11.0*  HCT 37.1* 35.5*  MCV 88.5 89.9  PLT 285 284   Basic Metabolic Panel Recent Labs    74/12/87 1230 08/17/19 0302  NA 127* 127*  K 4.2 4.7  CL 95* 95*  CO2 18* 20*  GLUCOSE 188* 127*  BUN 24* 31*  CREATININE 1.43* 1.55*  CALCIUM 8.7* 8.4*   Liver Function Tests Recent Labs    08/16/19 1230  AST 42*  ALT 32  ALKPHOS 157*  BILITOT 3.0*  PROT 8.3*  ALBUMIN 2.7*   No results for input(s): LIPASE, AMYLASE in the last 72 hours. Cardiac Enzymes No results for input(s): CKTOTAL, CKMB, CKMBINDEX, TROPONINI in the last 72 hours.  BNP: BNP (last 3 results) Recent Labs    03/21/19 1415 04/23/19 1350  BNP 426.9* 709.6*    ProBNP (last 3 results) No results for input(s): PROBNP in the last 8760 hours.   D-Dimer No  results for input(s): DDIMER in the last 72 hours. Hemoglobin A1C No results for input(s): HGBA1C in the last 72 hours. Fasting Lipid Panel No results for input(s): CHOL, HDL, LDLCALC, TRIG, CHOLHDL, LDLDIRECT in the last 72 hours. Thyroid Function Tests No results for input(s): TSH, T4TOTAL, T3FREE, THYROIDAB in the last 72 hours.  Invalid input(s): FREET3  Other results:   Imaging    CT ABDOMEN PELVIS WO CONTRAST  Result Date: 08/16/2019 CLINICAL DATA:  Ventricular assist device, elevated white blood cell count EXAM: CT CHEST, ABDOMEN  AND PELVIS WITHOUT CONTRAST TECHNIQUE: Multidetector CT imaging of the chest, abdomen and pelvis was performed following the standard protocol without IV contrast. COMPARISON:  04/23/2019 FINDINGS: CT CHEST FINDINGS Cardiovascular: The heart is enlarged. Left ventricular assist device is identified in the expected position. There is fluid anterior to the heart and along the left lateral aspect of the tubing that extends to the ascending aorta. This new fluid collection measures 2.9 x 2.0 cm reference axial image 39, and extends approximately 4.3 cm in craniocaudal extent. There is a second fluid collection along the inferior and right lateral aspect of the tubing, measuring 4.2 x 2.8 cm on image 48, and extending approximately 4.3 cm in craniocaudal extent. Findings are suspicious for infection given elevated white blood cell count and new appearance since previous study. Minimal atherosclerosis of the aortic arch. Mediastinum/Nodes: Multiple subcentimeter lymph nodes are seen within the mediastinum likely reactive. Esophagus, trachea, and thyroid are unremarkable. Lungs/Pleura: Emphysematous changes are seen, with large blebs and bullous seen anteriorly within the upper lobes. No acute airspace disease, effusion, or pneumothorax. Bibasilar areas of subsegmental atelectasis. Musculoskeletal: There are no acute or destructive bony lesions. Reconstructed images demonstrate no additional findings. CT ABDOMEN PELVIS FINDINGS Hepatobiliary: No focal liver abnormality is seen. No gallstones, gallbladder wall thickening, or biliary dilatation. Pancreas: Unremarkable. No pancreatic ductal dilatation or surrounding inflammatory changes. Spleen: Normal in size without focal abnormality. Adrenals/Urinary Tract: No urinary tract calculi or obstructive uropathy. The adrenals are unremarkable. Bladder is grossly normal. Stomach/Bowel: No bowel obstruction or ileus. No bowel wall thickening or inflammatory changes.  Vascular/Lymphatic: Aortic atherosclerosis. No enlarged abdominal or pelvic lymph nodes. Reproductive: Prostate is unremarkable. Other: There is a small amount of fluid surrounding the lying that enters the left upper quadrant abdominal wall and extends to the left ventricular assist device. This fluid is unchanged since prior study. The adjacent subcutaneous fat stranding within the left upper quadrant anterior abdominal wall seen previously has significantly improved in the interim. There is no free intraperitoneal fluid or free gas. Musculoskeletal: No acute or destructive bony lesions. IMPRESSION: 1. New fluid collections along the efferent tubing from the ventricular cyst device to the ascending aorta. Findings are suspicious for infection given clinical presentation and elevated white blood cell count. 2. Stable fluid surrounding the line within the left upper quadrant anterior abdominal wall, with decreased adjacent fat stranding. Infection in this region cannot be excluded. 3. Stable cardiomegaly. 4. Stable emphysema. 5. Otherwise no acute intra-abdominal or intrapelvic process. Electronically Signed   By: Sharlet Salina M.D.   On: 08/16/2019 17:59   CT CHEST WO CONTRAST  Result Date: 08/16/2019 CLINICAL DATA:  Ventricular assist device, elevated white blood cell count EXAM: CT CHEST, ABDOMEN AND PELVIS WITHOUT CONTRAST TECHNIQUE: Multidetector CT imaging of the chest, abdomen and pelvis was performed following the standard protocol without IV contrast. COMPARISON:  04/23/2019 FINDINGS: CT CHEST FINDINGS Cardiovascular: The heart is enlarged. Left ventricular assist device is  identified in the expected position. There is fluid anterior to the heart and along the left lateral aspect of the tubing that extends to the ascending aorta. This new fluid collection measures 2.9 x 2.0 cm reference axial image 39, and extends approximately 4.3 cm in craniocaudal extent. There is a second fluid collection along the  inferior and right lateral aspect of the tubing, measuring 4.2 x 2.8 cm on image 48, and extending approximately 4.3 cm in craniocaudal extent. Findings are suspicious for infection given elevated white blood cell count and new appearance since previous study. Minimal atherosclerosis of the aortic arch. Mediastinum/Nodes: Multiple subcentimeter lymph nodes are seen within the mediastinum likely reactive. Esophagus, trachea, and thyroid are unremarkable. Lungs/Pleura: Emphysematous changes are seen, with large blebs and bullous seen anteriorly within the upper lobes. No acute airspace disease, effusion, or pneumothorax. Bibasilar areas of subsegmental atelectasis. Musculoskeletal: There are no acute or destructive bony lesions. Reconstructed images demonstrate no additional findings. CT ABDOMEN PELVIS FINDINGS Hepatobiliary: No focal liver abnormality is seen. No gallstones, gallbladder wall thickening, or biliary dilatation. Pancreas: Unremarkable. No pancreatic ductal dilatation or surrounding inflammatory changes. Spleen: Normal in size without focal abnormality. Adrenals/Urinary Tract: No urinary tract calculi or obstructive uropathy. The adrenals are unremarkable. Bladder is grossly normal. Stomach/Bowel: No bowel obstruction or ileus. No bowel wall thickening or inflammatory changes. Vascular/Lymphatic: Aortic atherosclerosis. No enlarged abdominal or pelvic lymph nodes. Reproductive: Prostate is unremarkable. Other: There is a small amount of fluid surrounding the lying that enters the left upper quadrant abdominal wall and extends to the left ventricular assist device. This fluid is unchanged since prior study. The adjacent subcutaneous fat stranding within the left upper quadrant anterior abdominal wall seen previously has significantly improved in the interim. There is no free intraperitoneal fluid or free gas. Musculoskeletal: No acute or destructive bony lesions. IMPRESSION: 1. New fluid collections along  the efferent tubing from the ventricular cyst device to the ascending aorta. Findings are suspicious for infection given clinical presentation and elevated white blood cell count. 2. Stable fluid surrounding the line within the left upper quadrant anterior abdominal wall, with decreased adjacent fat stranding. Infection in this region cannot be excluded. 3. Stable cardiomegaly. 4. Stable emphysema. 5. Otherwise no acute intra-abdominal or intrapelvic process. Electronically Signed   By: Sharlet Salina M.D.   On: 08/16/2019 17:59   DG Chest Portable 1 View  Result Date: 08/16/2019 CLINICAL DATA:  Fever, cough EXAM: PORTABLE CHEST 1 VIEW COMPARISON:  Chest radiograph dated 04/23/2019 FINDINGS: The heart remains enlarged with a left ventricular assist device in unchanged position. Median sternotomy wires are redemonstrated. Mild right basilar atelectasis/airspace disease is noted. The left lung is clear. There is no pleural effusion or pneumothorax. The visualized skeletal structures are unremarkable. IMPRESSION: 1. Mild right basilar atelectasis/airspace disease. 2. Unchanged cardiomegaly with left ventricular assist device. Electronically Signed   By: Romona Curls M.D.   On: 08/16/2019 15:41      Medications:     Scheduled Medications: . Chlorhexidine Gluconate Cloth  6 each Topical Daily  . citalopram  10 mg Oral Daily  . docusate sodium  100 mg Oral TID  . docusate sodium  200 mg Oral Daily  . ferrous sulfate  325 mg Oral Q breakfast  . gabapentin  300 mg Oral TID  . gabapentin  300 mg Oral TID  . hydrALAZINE  50 mg Oral TID  . pantoprazole  40 mg Oral BID  . potassium chloride SA  20 mEq  Oral Daily  . sildenafil  20 mg Oral TID  . sodium chloride flush  3 mL Intravenous Q12H     Infusions: . sodium chloride    . sodium chloride 75 mL/hr at 08/17/19 0921  . ceFEPime (MAXIPIME) IV 2 g (08/17/19 0920)  . vancomycin       PRN Medications:  sodium chloride, acetaminophen, bisacodyl  **OR** bisacodyl, hydrOXYzine, magnesium hydroxide, ondansetron (ZOFRAN) IV, sodium chloride flush, traMADol    Assessment/Plan    1. 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 Per Dr. Donata Clay, CT of C/A/P most c/w post operative changes and not infection. No plans for OR yet  - continue empiric vanc/cefipime   2.Chronic systolic HF - EF 93% s/p HM-3 LVAD on 09/06/17 - Volume status ok - NYHA III  3. HM3 LVAD 09/06/2017. - VAD interrogated personally. Parameters stable.  - LDH 227 - INR 4.5. Coumadin on hold. PharmD following  - Off ASA with PUD.  - continue gabapentin for pocket pain. Discussed w/ PharmD. May also benefit from Cymbalta which may also help w/ pocket pain. Stop Celexa and switch to Cymbalta   4. Essential HTN - MAPs ok  5. AKI - baseline SCr ~1.2. Up to 1.5 today  - suspect volume depletion - hydrate gently.   6. OSA - sleep study with very severe OSA (AHI 69/hr) - non-compliant  7. H/o GI bleed  - 08/2018 EGD completed and showed gastritis and nonbleeding duodenal ulcers, colonoscopy with 5 polyps removed. Protonix was increased to BID.  - No recent GI bleeding - hgb 10.9 -> 10.5 -> 12.9 -> 12.8->11.0 - He will stay off aspirin for now.   8. Tobacco Abuse  - says he quit recently  9. Severe protein calorie malnutrition  - nutrition to see. Will place consult     Length of Stay: 1  Jonathan Wood  08/17/2019, 10:28 AM  Advanced Heart Failure Team Pager [phone] (M-F; 7a - 4p)  Please contact [facility] for night-coverage after hours (4p -7a ) and weekends on [email or web address]  Patient seen and examined with the above-signed Advanced Practice Provider and/or Housestaff. I personally reviewed laboratory data, imaging studies and relevant notes. I independently examined the patient and formulated the  important aspects of the plan. I have edited the note to reflect any of my changes or salient points. I have personally discussed the plan with the patient and/or family.  On broad spectrum abx  For possible DL infection. Remains febrile to 101.5. WBC 27.3k -> 26.2k. PCT 3.04 Still with chest discomfort. CX NGTD. MAPs stable. Creatinine up 1.2 -> 1.4 -> 1.5.   General:  Sitting up in bed  HEENT: normal  Neck: supple. JVP not elevated.  Carotids 2+ bilat; no bruits. No lymphadenopathy or thryomegaly appreciated. Cor: LVAD hum. Mild chest tenderness Lungs: Clear. Abdomen: obese soft, nontender, non-distended. No hepatosplenomegaly. No bruits or masses. Good bowel sounds. Driveline site clean. Anchor in place.  Extremities: no cyanosis, clubbing, rash. Warm no edema  Neuro: alert & oriented x 3. No focal deficits. Moves all 4 without problem   He clearly has sepsis. I remned very concerned about his driveline site but have reviewed CT with Dr. Donata Clay and he is unsure if changes seen on CT are new or post-operative (no old). There  is also concern for PNA. I have reached out to VAD radiologists to review.  Continue broad spectrum abx. Watch cultures. May need some gentle hydration if AKI not improved. VAD interrogated personally. Parameters stable. INR 4.5. Vitamin k given yesterday. Discussed dosing with PharmD personally.  [name], MD  3:47 PM

## 2019-08-18 DIAGNOSIS — R7881 Bacteremia: Secondary | ICD-10-CM | POA: Diagnosis present

## 2019-08-18 DIAGNOSIS — J181 Lobar pneumonia, unspecified organism: Secondary | ICD-10-CM

## 2019-08-18 DIAGNOSIS — I428 Other cardiomyopathies: Secondary | ICD-10-CM

## 2019-08-18 DIAGNOSIS — I10 Essential (primary) hypertension: Secondary | ICD-10-CM | POA: Diagnosis present

## 2019-08-18 DIAGNOSIS — F17210 Nicotine dependence, cigarettes, uncomplicated: Secondary | ICD-10-CM | POA: Diagnosis present

## 2019-08-18 DIAGNOSIS — J449 Chronic obstructive pulmonary disease, unspecified: Secondary | ICD-10-CM | POA: Diagnosis present

## 2019-08-18 DIAGNOSIS — B9561 Methicillin susceptible Staphylococcus aureus infection as the cause of diseases classified elsewhere: Secondary | ICD-10-CM | POA: Diagnosis present

## 2019-08-18 LAB — PROTIME-INR
INR: 5 (ref 0.8–1.2)
Prothrombin Time: 46.7 seconds — ABNORMAL HIGH (ref 11.4–15.2)

## 2019-08-18 LAB — CBC
HCT: 31.5 % — ABNORMAL LOW (ref 39.0–52.0)
Hemoglobin: 9.8 g/dL — ABNORMAL LOW (ref 13.0–17.0)
MCH: 28.1 pg (ref 26.0–34.0)
MCHC: 31.1 g/dL (ref 30.0–36.0)
MCV: 90.3 fL (ref 80.0–100.0)
Platelets: 287 10*3/uL (ref 150–400)
RBC: 3.49 MIL/uL — ABNORMAL LOW (ref 4.22–5.81)
RDW: 16 % — ABNORMAL HIGH (ref 11.5–15.5)
WBC: 24.1 10*3/uL — ABNORMAL HIGH (ref 4.0–10.5)
nRBC: 0.2 % (ref 0.0–0.2)

## 2019-08-18 LAB — BASIC METABOLIC PANEL
Anion gap: 10 (ref 5–15)
BUN: 29 mg/dL — ABNORMAL HIGH (ref 6–20)
CO2: 21 mmol/L — ABNORMAL LOW (ref 22–32)
Calcium: 8.4 mg/dL — ABNORMAL LOW (ref 8.9–10.3)
Chloride: 96 mmol/L — ABNORMAL LOW (ref 98–111)
Creatinine, Ser: 1.16 mg/dL (ref 0.61–1.24)
GFR calc Af Amer: >60 mL/min (ref >60)
GFR calc non Af Amer: >60 mL/min (ref >60)
Glucose, Bld: 195 mg/dL — ABNORMAL HIGH (ref 70–99)
Potassium: 4.3 mmol/L (ref 3.5–5.1)
Sodium: 127 mmol/L — ABNORMAL LOW (ref 135–145)

## 2019-08-18 LAB — LACTATE DEHYDROGENASE: LDH: 230 U/L — ABNORMAL HIGH (ref 98–192)

## 2019-08-18 MED ORDER — SODIUM CHLORIDE 0.9 % IV BOLUS
500.0000 mL | Freq: Once | INTRAVENOUS | Status: AC
  Administered 2019-08-18: 500 mL via INTRAVENOUS

## 2019-08-18 MED ORDER — ALBUTEROL SULFATE (2.5 MG/3ML) 0.083% IN NEBU
2.5000 mg | INHALATION_SOLUTION | Freq: Four times a day (QID) | RESPIRATORY_TRACT | Status: DC | PRN
  Administered 2019-08-18: 2.5 mg via RESPIRATORY_TRACT
  Filled 2019-08-18: qty 3

## 2019-08-18 MED ORDER — RIFAMPIN 300 MG PO CAPS
600.0000 mg | ORAL_CAPSULE | Freq: Every day | ORAL | Status: DC
  Administered 2019-08-18 – 2019-08-21 (×3): 600 mg via ORAL
  Filled 2019-08-18 (×5): qty 2

## 2019-08-18 MED ORDER — LINACLOTIDE 145 MCG PO CAPS
145.0000 ug | ORAL_CAPSULE | ORAL | Status: DC
  Administered 2019-08-18 – 2019-08-26 (×2): 145 ug via ORAL
  Filled 2019-08-18 (×5): qty 1

## 2019-08-18 MED ORDER — CEFAZOLIN SODIUM-DEXTROSE 2-4 GM/100ML-% IV SOLN
2.0000 g | Freq: Three times a day (TID) | INTRAVENOUS | Status: DC
  Administered 2019-08-18 – 2019-08-28 (×29): 2 g via INTRAVENOUS
  Filled 2019-08-18 (×33): qty 100

## 2019-08-18 MED ORDER — FUROSEMIDE 10 MG/ML IJ SOLN
40.0000 mg | Freq: Once | INTRAMUSCULAR | Status: AC
  Administered 2019-08-18: 40 mg via INTRAVENOUS
  Filled 2019-08-18: qty 4

## 2019-08-18 MED ORDER — HYDROMORPHONE HCL 1 MG/ML IJ SOLN
1.0000 mg | Freq: Four times a day (QID) | INTRAMUSCULAR | Status: DC | PRN
  Administered 2019-08-18 – 2019-08-20 (×8): 1 mg via INTRAVENOUS
  Filled 2019-08-18 (×9): qty 1

## 2019-08-18 MED ORDER — PHYTONADIONE 5 MG PO TABS
2.5000 mg | ORAL_TABLET | Freq: Once | ORAL | Status: AC
  Administered 2019-08-18: 2.5 mg via ORAL
  Filled 2019-08-18: qty 1

## 2019-08-18 NOTE — Consult Note (Signed)
[facility]    Date of Admission:  08/16/2019    Total days of antibiotics 3        Day 1 nafcillin               Reason for Consult: Automatic consultation for Staph aureus bacteremia      Assessment: He has recurrent MSSA infection now complicated by bacteremia.  A new fluid collection seen on CT may represent an abscess.  He also has some right lower lobe pneumonia.  I favor changing nafcillin to cefazolin which is easier to dose and less phlebitic.  I will also add oral rifampin.  I will obtain repeat blood cultures now.  We will need to discuss the potential utility of TEE with Dr. [name] and Dr. Maren Beach.  Plan: 1. Change nafcillin to cefazolin 2. And oral rifampin 3. Repeat blood cultures  Principal Problem:   Infection associated with driveline of left ventricular assist device (LVAD) (HCC) Active Problems:   Bacteremia due to methicillin susceptible Staphylococcus aureus (MSSA)   Elevated LFTs   LVAD (left ventricular assist device) present (HCC)   Normocytic anemia   Chronic combined systolic (congestive) and diastolic (congestive) heart failure (HCC)   OSA on CPAP   Sepsis (HCC)   Cigarette smoker   COPD (chronic obstructive pulmonary disease) (HCC)   Hypertension   Scheduled Meds: . Chlorhexidine Gluconate Cloth  6 each Topical Daily  . docusate sodium  100 mg Oral TID  . DULoxetine  30 mg Oral Daily  . feeding supplement (ENSURE ENLIVE)  237 mL Oral TID BM  . ferrous sulfate  325 mg Oral Q breakfast  . gabapentin  300 mg Oral TID  . hydrALAZINE  50 mg Oral TID  . linaclotide  145 mcg Oral QODAY  . multivitamin with minerals  1 tablet Oral Daily  . pantoprazole  40 mg Oral BID  . sildenafil  20 mg Oral TID  . sodium chloride flush  3 mL Intravenous Q12H   Continuous Infusions: . sodium chloride    . nafcillin IV Stopped (08/18/19 1030)   PRN Meds:.sodium chloride, acetaminophen, bisacodyl **OR** bisacodyl,  hydrOXYzine, magnesium hydroxide, Muscle Rub, ondansetron (ZOFRAN) IV, sodium chloride flush, traMADol  HPI: Jonathan Wood is a 49 y.o. male with nonischemic cardiomyopathy who had LVAD placement in 2019.  He was admitted to the hospital in early December of last year with abdominal wall cellulitis and driveline tract drainage.  Drainage cultures grew MSSA.  Blood cultures were negative.  He was treated with 9 days of IV cefazolin before transitioning to oral doxycycline which she completed on 05/11/2019.  He said that he was doing much better at that time.  About a week ago he began to have lower chest and upper abdominal discomfort.  He had one episode of shaking chills.  He has had a slight increase in his chronic cough.  It is productive of small amounts of green sputum.  He has been short of breath.    He was seen in the LVAD clinic on 08/16/2019.  His driveline dressing was dislodged and a scant amount of drainage was noted.  His white blood cell count was elevated at 27,000 so he was directed to the emergency department for admission.  His temperature upon arrival went up to 101.5 degrees.  Blood cultures were obtained and he was started on vancomycin and cefepime.  Admission blood cultures have grown MSSA.  Antibiotics were narrowed to nafcillin last night.  He has defervesced.  He has not had any more chills.  He is still having some upper abdominal discomfort but says that he is feeling better.  There was no evidence of endocarditis on TTE.  CT scan revealed:  IMPRESSION: 1. New fluid collections along the efferent tubing from the ventricular cyst device to the ascending aorta. Findings are suspicious for infection given clinical presentation and elevated white blood cell count. 2. Stable fluid surrounding the line within the left upper quadrant anterior abdominal wall, with decreased adjacent fat stranding. Infection in this region cannot be excluded. 3. Stable cardiomegaly. 4. Stable  emphysema. 5. Otherwise no acute intra-abdominal or intrapelvic process.  Review of Systems: Review of Systems  Constitutional: Positive for chills, fever and malaise/fatigue. Negative for weight loss.  Respiratory: Positive for cough, sputum production and shortness of breath.   Cardiovascular: Positive for chest pain.  Gastrointestinal: Positive for abdominal pain. Negative for diarrhea, nausea and vomiting.  Genitourinary: Negative for dysuria.  Musculoskeletal: Negative for back pain and joint pain.  Skin: Negative for rash.  Neurological: Negative for headaches.    Past Medical History:  Diagnosis Date  . Asthma   . CHF (congestive heart failure) ([place])    a. 09/2016: EF 20-25% with cath showing normal cors  . GERD (gastroesophageal reflux disease)   . History of hiatal hernia   . OSA on CPAP 09/06/2018   Severe OSA with AHI 68/hr on CPAP at 12cm H2O    Social History   Tobacco Use  . Smoking status: Current Every Day Smoker    Packs/day: 0.50    Years: 25.00    Pack years: 12.50    Types: Cigarettes  . Smokeless tobacco: Never Used  . Tobacco comment: quit 1 week  Substance Use Topics  . Alcohol use: Yes    Comment: occas  . Drug use: No    Family History  Problem Relation Age of Onset  . Hypertension Father   . Colon cancer Neg Hx   . Esophageal cancer Neg Hx   . Inflammatory bowel disease Neg Hx   . Liver disease Neg Hx   . Pancreatic cancer Neg Hx   . Rectal cancer Neg Hx   . Stomach cancer Neg Hx    No Known Allergies  OBJECTIVE: Blood pressure 100/69, pulse (!) 106, temperature 97.8 F (36.6 C), temperature source Oral, resp. rate (!) 29, height 6\' 2"  (1.88 m), weight 104.8 kg, SpO2 100 %.  Physical Exam Constitutional:      Comments: He is very pleasant and in no distress.  He is sitting on the side of his bed.  Cardiovascular:     Rate and Rhythm: Regular rhythm. Tachycardia present.  Pulmonary:     Effort: Pulmonary effort is normal.      Breath sounds: Normal breath sounds.  Abdominal:     Palpations: Abdomen is soft.     Tenderness: There is abdominal tenderness.     Comments: He has a clean gauze dressing over his left-sided driveline exit site.  He is tender to palpation from the epigastrium across the left upper abdomen.  There is no obvious cellulitis or fluctuance.  Musculoskeletal:        General: No swelling or tenderness.  Skin:    Findings: No rash.  Neurological:     General: No focal deficit present.  Psychiatric:        Mood and Affect: Mood normal.  Lab Results Lab Results  Component Value Date   WBC 24.1 (H) 08/18/2019   HGB 9.8 (L) 08/18/2019   HCT 31.5 (L) 08/18/2019   MCV 90.3 08/18/2019   PLT 287 08/18/2019    Lab Results  Component Value Date   CREATININE 1.16 08/18/2019   BUN 29 (H) 08/18/2019   NA 127 (L) 08/18/2019   K 4.3 08/18/2019   CL 96 (L) 08/18/2019   CO2 21 (L) 08/18/2019    Lab Results  Component Value Date   ALT 32 08/16/2019   AST 42 (H) 08/16/2019   ALKPHOS 157 (H) 08/16/2019   BILITOT 3.0 (H) 08/16/2019     Microbiology: Recent Results (from the past 240 hour(s))  Culture, blood (routine x 2)     Status: Abnormal (Preliminary result)   Collection Time: 08/16/19  3:39 PM   Specimen: BLOOD  Result Value Ref Range Status   Specimen Description BLOOD LEFT ANTECUBITAL  Final   Special Requests   Final    BOTTLES DRAWN AEROBIC ONLY Blood Culture adequate volume   Culture  Setup Time   Final    GRAM POSITIVE COCCI IN CLUSTERS AEROBIC BOTTLE ONLY CRITICAL RESULT CALLED TO, READ BACK BY AND VERIFIED WITHColin Rhein [facility] 2041 08/17/19 A BROWNING Performed at [facility], [address] [place], [place] [address]    Culture STAPHYLOCOCCUS AUREUS (A)  Final   Report Status PENDING  Incomplete  Culture, blood (routine x 2)     Status: Abnormal (Preliminary result)   Collection Time: 08/16/19  3:41 PM   Specimen: BLOOD  Result Value Ref Range Status    Specimen Description BLOOD RIGHT ANTECUBITAL  Final   Special Requests   Final    BOTTLES DRAWN AEROBIC AND ANAEROBIC Blood Culture adequate volume   Culture  Setup Time   Final    GRAM POSITIVE COCCI IN CLUSTERS IN BOTH AEROBIC AND ANAEROBIC BOTTLES Organism ID to follow CRITICAL RESULT CALLED TO, READ BACK BY AND VERIFIED WITH: B MANCHERIL PHARMD 2041 08/17/19 A BROWNING    Culture (A)  Final    STAPHYLOCOCCUS AUREUS SUSCEPTIBILITIES TO FOLLOW Performed at [facility], [address] [place], [place] [address]    Report Status PENDING  Incomplete  Blood Culture ID Panel (Reflexed)     Status: Abnormal   Collection Time: 08/16/19  3:41 PM  Result Value Ref Range Status   Enterococcus species NOT DETECTED NOT DETECTED Final   Listeria monocytogenes NOT DETECTED NOT DETECTED Final   Staphylococcus species DETECTED (A) NOT DETECTED Final    Comment: CRITICAL RESULT CALLED TO, READ BACK BY AND VERIFIED WITH: B MANCHERIL PHARMD 2041 08/17/19 A BROWNING    Staphylococcus aureus (BCID) DETECTED (A) NOT DETECTED Final    Comment: Methicillin (oxacillin) susceptible Staphylococcus aureus (MSSA). Preferred therapy is anti staphylococcal beta lactam antibiotic (Cefazolin or Nafcillin), unless clinically contraindicated. CRITICAL RESULT CALLED TO, READ BACK BY AND VERIFIED WITH: B MANCHERIL PHARMD 2041 08/17/19 A BROWNING    Methicillin resistance NOT DETECTED NOT DETECTED Final   Streptococcus species NOT DETECTED NOT DETECTED Final   Streptococcus agalactiae NOT DETECTED NOT DETECTED Final   Streptococcus pneumoniae NOT DETECTED NOT DETECTED Final   Streptococcus pyogenes NOT DETECTED NOT DETECTED Final   Acinetobacter baumannii NOT DETECTED NOT DETECTED Final   Enterobacteriaceae species NOT DETECTED NOT DETECTED Final   Enterobacter cloacae complex NOT DETECTED NOT DETECTED Final   Escherichia coli NOT DETECTED NOT DETECTED Final  Klebsiella oxytoca NOT DETECTED NOT DETECTED Final     Klebsiella pneumoniae NOT DETECTED NOT DETECTED Final   Proteus species NOT DETECTED NOT DETECTED Final   Serratia marcescens NOT DETECTED NOT DETECTED Final   Haemophilus influenzae NOT DETECTED NOT DETECTED Final   Neisseria meningitidis NOT DETECTED NOT DETECTED Final   Pseudomonas aeruginosa NOT DETECTED NOT DETECTED Final   Candida albicans NOT DETECTED NOT DETECTED Final   Candida glabrata NOT DETECTED NOT DETECTED Final   Candida krusei NOT DETECTED NOT DETECTED Final   Candida parapsilosis NOT DETECTED NOT DETECTED Final   Candida tropicalis NOT DETECTED NOT DETECTED Final    Comment: Performed at [facility], [address], [place], [place] [address]  SARS CORONAVIRUS 2 (TAT 6-24 HRS) Nasopharyngeal Nasopharyngeal Swab     Status: None   Collection Time: 08/16/19  3:54 PM   Specimen: Nasopharyngeal Swab  Result Value Ref Range Status   SARS Coronavirus 2 NEGATIVE NEGATIVE Final    Comment: (NOTE) SARS-CoV-2 target nucleic acids are NOT DETECTED. The SARS-CoV-2 RNA is generally detectable in upper and lower respiratory specimens during the acute phase of infection. Negative results do not preclude SARS-CoV-2 infection, do not rule out co-infections with other pathogens, and should not be used as the sole basis for treatment or other patient management decisions. Negative results must be combined with clinical observations, patient history, and epidemiological information. The expected result is Negative. Fact Sheet for Patients: HairSlick.no Fact Sheet for Healthcare Providers: quierodirigir.com This test is not yet approved or cleared by the [place] FDA and  has been authorized for detection and/or diagnosis of SARS-CoV-2 by FDA under an Emergency Use Authorization (EUA). This EUA will remain  in effect (meaning this test can be used) for the duration of the COVID-19 declaration under Section 56 4(b)(1)  of the Act, 21 U.S.C. section 360bbb-3(b)(1), unless the authorization is terminated or revoked sooner. Performed at [facility], [address], [place], [place] [address]   MRSA PCR Screening     Status: None   Collection Time: 08/16/19  6:41 PM   Specimen: Nasopharyngeal  Result Value Ref Range Status   MRSA by PCR NEGATIVE NEGATIVE Final    Comment:        The GeneXpert MRSA Assay (FDA approved for NASAL specimens only), is one component of a comprehensive MRSA colonization surveillance program. It is not intended to diagnose MRSA infection nor to guide or monitor treatment for MRSA infections. Performed at [facility], [address] [place], [place] [address]     Cliffton Asters, MD [facility] [phone] pager   [phone] cell 08/18/2019, 2:01 PM

## 2019-08-18 NOTE — Progress Notes (Signed)
LVAD Coordinator Rounding Note:  Admitted 08/16/19 by Dr. [name] due to VAD drive line infection/pneumonia/sepsis  HM III LVAD implanted on 09/06/17 by Dr. Cyndia Bent under Destination Therapy criteria.  Pt asleep in bed. Awakened when VAD Coordinator entered the room. He is c/o sternal pain. Reports he is taking tramadol and tylenol prn for pain, but feels he needs something stronger.   Pt says he is continuing to get IV antibiotics and Dr. [name] will discuss treatment plan with Dr. Darcey Nora.     Vital signs: Temp: 97.7 HR: 103 Doppler Pressure: not done Automatic BP:  87/62 (72) O2 Sat: 98% RA Wt: 227.5>231  lbs   LVAD interrogation reveals:  Speed: 5600 Flow: 5.4 Power:  4.3w PI: 2.7 Alarms: none Events: rare PI Hematocrit: 32  Fixed speed: 5600 Low speed limit: 5300  Drive Line: CDI. Twice weekly dressing changes using daily kit w/siliver strip by bedside nurse. Next dressing change due 08/20/19.  Labs:  LDH trend: 227>230  INR trend: 4.5>5.0  WBC: 27.3>26.2>24  Infection: - blood cultures 08/17/19>>staphylococcus aureus  Anticoagulation Plan: -INR Goal: 2.0 - 2.5  -ASA Dose: none  Adverse Events: - driveline cx 53/6/14>> staph aureus   Plan/Recommendations:  1. Call VAD pager if any VAD equipment or drive line issues. 2. Twice weekly dressing changes by bedside nurse. Next dressing change due 08/20/19.  Zada Girt RN, VAD Coordinator 24/7 VAD Pager: [phone]

## 2019-08-18 NOTE — Progress Notes (Signed)
Advanced Heart Failure Rounding Note  PCP-Cardiologist: [name], MD    Patient Profile   49 y.o. 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  Subjective:    Blood cultures positive overnight for MSSA.  ID has changed antibiotics to cefazolin and rifampin.  He is afebrile.  White count down from 26,000-24,000  More short of breath and tachypneic this morning. Having pain along driveline site.    VAD Interrogation: Speed:5600, Flow:5., Power:4.0, PI:2.7, VAD interrogated personally. Parameters stable.   CT of C/A/P 08/16/19 IMPRESSION: 1. New fluid collections along the efferent tubing from the ventricular cyst device to the ascending aorta. Findings are suspicious for infection given clinical presentation and elevated white blood cell count. 2. Stable fluid surrounding the line within the left upper quadrant anterior abdominal wall, with decreased adjacent fat stranding. Infection in this region cannot be excluded. 3. Stable cardiomegaly. 4. Stable emphysema. 5. Otherwise no acute intra-abdominal or intrapelvic process.  Objective:   Weight Range: 104.8 kg Body mass index is 29.66 kg/m.   Vital Signs:   Temp:  [97.4 F (36.3 C)-98.3 F (36.8 C)] 97.8 F (36.6 C) (04/03 1200) Pulse Rate:  [34-116] 34 (04/03 1448) Resp:  [23-33] 27 (04/03 1448) BP: (80-106)/(62-88) 106/88 (04/03 1448) SpO2:  [96 %-100 %] 97 % (04/03 1448) Weight:  [104.1 kg-104.8 kg] 104.8 kg (04/03 0500) Last BM Date: 08/17/19  Weight change: Filed Weights   08/17/19 0500 08/17/19 2319 08/18/19 0500  Weight: 103.2 kg 104.1 kg 104.8 kg    Intake/Output:   Intake/Output Summary (Last 24 hours) at 08/18/2019 1518 Last data filed at 08/18/2019 1200 Gross per 24 hour  Intake 1884.75 ml  Output 500 ml  Net 1384.75 ml       Physical Exam    General:  SOB and tachypneic HEENT: normal  Neck: supple. JVP hard to see.  Carotids 2+ bilat; no bruits. No lymphadenopathy or thryomegaly appreciated. Cor: LVAD hum.  Tender on palpation of subxiphoid region Lungs: Clear. Abdomen: obese soft, nontender + distended. No hepatosplenomegaly. No bruits or masses. Good bowel sounds. Driveline site clean. Anchor in place.  Extremities: no cyanosis, clubbing, rash. Warm no edema  Neuro: alert & oriented x 3. No focal deficits. Moves all 4 without problem    Telemetry   Sinus 90s Personally reviewed  Labs    CBC Recent Labs    08/17/19 0302 08/18/19 0218  WBC 26.2* 24.1*  HGB 11.0* 9.8*  HCT 35.5* 31.5*  MCV 89.9 90.3  PLT 284 403   Basic Metabolic Panel Recent Labs    08/17/19 0302 08/18/19 0218  NA 127* 127*  K 4.7 4.3  CL 95* 96*  CO2 20* 21*  GLUCOSE 127* 195*  BUN 31* 29*  CREATININE 1.55* 1.16  CALCIUM 8.4* 8.4*   Liver Function Tests Recent Labs    08/16/19 1230  AST 42*  ALT 32  ALKPHOS 157*  BILITOT 3.0*  PROT 8.3*  ALBUMIN 2.7*   No results for input(s): LIPASE, AMYLASE in the last 72 hours. Cardiac Enzymes No results for input(s): CKTOTAL, CKMB, CKMBINDEX, TROPONINI in the last 72 hours.  BNP: BNP (last 3 results) Recent Labs    03/21/19 1415 04/23/19 1350  BNP 426.9* 709.6*    ProBNP (last 3 results) No results for input(s): PROBNP in the last 8760 hours.  D-Dimer No results for input(s): DDIMER in the last 72 hours. Hemoglobin A1C No results for input(s): HGBA1C in the last 72 hours. Fasting Lipid Panel No results for input(s): CHOL, HDL, LDLCALC, TRIG, CHOLHDL, LDLDIRECT in the last 72 hours. Thyroid Function Tests No results for input(s): TSH, T4TOTAL, T3FREE, THYROIDAB in the last 72 hours.  Invalid input(s): FREET3  Other results:   Imaging    No results found.   Medications:     Scheduled Medications: . Chlorhexidine Gluconate Cloth  6  each Topical Daily  . docusate sodium  100 mg Oral TID  . DULoxetine  30 mg Oral Daily  . feeding supplement (ENSURE ENLIVE)  237 mL Oral TID BM  . ferrous sulfate  325 mg Oral Q breakfast  . gabapentin  300 mg Oral TID  . hydrALAZINE  50 mg Oral TID  . linaclotide  145 mcg Oral QODAY  . multivitamin with minerals  1 tablet Oral Daily  . pantoprazole  40 mg Oral BID  . rifampin  600 mg Oral Daily  . sildenafil  20 mg Oral TID  . sodium chloride flush  3 mL Intravenous Q12H    Infusions: . sodium chloride    .  ceFAZolin (ANCEF) IV      PRN Medications: sodium chloride, acetaminophen, albuterol, bisacodyl **OR** bisacodyl, hydrOXYzine, magnesium hydroxide, Muscle Rub, ondansetron (ZOFRAN) IV, sodium chloride flush, traMADol    Assessment/Plan    1. Fever & leukocytosis -> MSSA bacteremia - bcx 2/2 MSSA - suspect DL infection given CT findings and pain on exam over DL - Now afebrile.  White count dropping slowly. - ID has changed antibiotics to cefazolin and rifampin. - I have reviewed CT images with radiology personally.  There are new fluid collections along the driveline just outside of the pericardium as well as in the subxiphoid region.  I have also reviewed with Dr. Maren Beach who would like to see a repeat CT after several days of antibiotics. -He is not a candidate for VAD exchange due to severe RV failure and noncompliance.  May need surgical I&D however  2.Chronic systolic HF - EF 50% also has severe RV failure.  S/p HM-3 LVAD on 09/06/17 -More short of breath today.  Suspect volume overload and pain.  Will give a dose of Lasix and also nebulizers.  3. HM3 LVAD 09/06/2017. - VAD interrogated personally. Parameters stable. - LDH 230 - INR 4.5 -> 5.0 likely has component of liver failure.  Given possible need for surgical I&D will give vitamin K. Discussed dosing with PharmD personally. - Off ASA with PUD.   4. Essential HTN - MAPs ok.  5. AKI - baseline SCr  ~1.2.  -Went to 1.55.  Back down to 1.1 today  6. OSA - sleep study with very severe OSA (AHI 69/hr) - non-compliant CPAP  7. H/o GI bleed  - 08/2018 EGD completed and showed gastritis and nonbleeding duodenal ulcers, colonoscopy with 5 polyps removed. Protonix was increased to BID.  - No recent GI bleeding - hgb 10.9 -> 10.5 -> 12.9 -> 12.8->11.0 -> 9.8 - He will stay off aspirin for now.   8. Tobacco Abuse  - says he quit recently  9. Severe protein calorie malnutrition  - nutrition to see.     Length of Stay: 2  Arvilla Meres, MD  08/18/2019, 3:18 PM  Advanced Heart Failure Team Pager [phone] (M-F; 7a - 4p)  Please contact [facility] for night-coverage after hours (4p -7a ) and  weekends on [email or web address]

## 2019-08-18 NOTE — Progress Notes (Signed)
Patient's RR was 20's - 30's with wheezing sound in the afternoon, and HR was 100's. Patient c/o cough & abdomen pain. Administered tramadol which was helping for pain. Notified Dr. [name] regarding tachypnea & ST. Ordered lasix 40 mg IVP once, dilaudid pain meds & breathing treatment. Administered both medications at 1516. Dr. [name] checked patient and patient will go to surgery on Monday due to infection. Infection disease, Dr. Orvan Falconer changed antibiotics and blood culture. Patient didn't much response with lasix, voided only 175 ml and dilaudid helped pain control. BT went up 100.4 at 1709. Notified Dr. [name] regarding this and ordered NS 500 ml bolus. Administered NS bolus, BT went down to 99.1, HR & RR went down. Continue to monitor V/S and pt's condition. HS [employer]

## 2019-08-19 DIAGNOSIS — I110 Hypertensive heart disease with heart failure: Secondary | ICD-10-CM

## 2019-08-19 DIAGNOSIS — R000 Tachycardia, unspecified: Secondary | ICD-10-CM

## 2019-08-19 DIAGNOSIS — J449 Chronic obstructive pulmonary disease, unspecified: Secondary | ICD-10-CM

## 2019-08-19 DIAGNOSIS — R079 Chest pain, unspecified: Secondary | ICD-10-CM

## 2019-08-19 DIAGNOSIS — R1013 Epigastric pain: Secondary | ICD-10-CM

## 2019-08-19 DIAGNOSIS — F17210 Nicotine dependence, cigarettes, uncomplicated: Secondary | ICD-10-CM

## 2019-08-19 DIAGNOSIS — I428 Other cardiomyopathies: Secondary | ICD-10-CM

## 2019-08-19 DIAGNOSIS — R945 Abnormal results of liver function studies: Secondary | ICD-10-CM

## 2019-08-19 DIAGNOSIS — Z9989 Dependence on other enabling machines and devices: Secondary | ICD-10-CM

## 2019-08-19 DIAGNOSIS — J189 Pneumonia, unspecified organism: Secondary | ICD-10-CM

## 2019-08-19 DIAGNOSIS — D649 Anemia, unspecified: Secondary | ICD-10-CM

## 2019-08-19 DIAGNOSIS — G4733 Obstructive sleep apnea (adult) (pediatric): Secondary | ICD-10-CM

## 2019-08-19 LAB — CULTURE, BLOOD (ROUTINE X 2)
Special Requests: ADEQUATE
Special Requests: ADEQUATE

## 2019-08-19 LAB — BASIC METABOLIC PANEL WITH GFR
Anion gap: 11 (ref 5–15)
BUN: 29 mg/dL — ABNORMAL HIGH (ref 6–20)
CO2: 24 mmol/L (ref 22–32)
Calcium: 8.7 mg/dL — ABNORMAL LOW (ref 8.9–10.3)
Chloride: 93 mmol/L — ABNORMAL LOW (ref 98–111)
Creatinine, Ser: 1.22 mg/dL (ref 0.61–1.24)
GFR calc Af Amer: >60 mL/min
GFR calc non Af Amer: >60 mL/min
Glucose, Bld: 163 mg/dL — ABNORMAL HIGH (ref 70–99)
Potassium: 4.7 mmol/L (ref 3.5–5.1)
Sodium: 128 mmol/L — ABNORMAL LOW (ref 135–145)

## 2019-08-19 LAB — CBC
HCT: 33.7 % — ABNORMAL LOW (ref 39.0–52.0)
Hemoglobin: 10.4 g/dL — ABNORMAL LOW (ref 13.0–17.0)
MCH: 28.4 pg (ref 26.0–34.0)
MCHC: 30.9 g/dL (ref 30.0–36.0)
MCV: 92.1 fL (ref 80.0–100.0)
Platelets: 297 10*3/uL (ref 150–400)
RBC: 3.66 MIL/uL — ABNORMAL LOW (ref 4.22–5.81)
RDW: 16.1 % — ABNORMAL HIGH (ref 11.5–15.5)
WBC: 29.9 10*3/uL — ABNORMAL HIGH (ref 4.0–10.5)
nRBC: 0.2 % (ref 0.0–0.2)

## 2019-08-19 LAB — LACTATE DEHYDROGENASE: LDH: 230 U/L — ABNORMAL HIGH (ref 98–192)

## 2019-08-19 LAB — PROTIME-INR
INR: 2.6 — ABNORMAL HIGH (ref 0.8–1.2)
Prothrombin Time: 27.8 s — ABNORMAL HIGH (ref 11.4–15.2)

## 2019-08-19 NOTE — Plan of Care (Signed)
?  Problem: Education: ?Goal: Knowledge of General Education information will improve ?Description: Including pain rating scale, medication(s)/side effects and non-pharmacologic comfort measures ?Outcome: Progressing ?  ?Problem: Clinical Measurements: ?Goal: Ability to maintain clinical measurements within normal limits will improve ?Outcome: Progressing ?  ?Problem: Nutrition: ?Goal: Adequate nutrition will be maintained ?Outcome: Progressing ?  ?Problem: Coping: ?Goal: Level of anxiety will decrease ?Outcome: Progressing ?  ?Problem: Elimination: ?Goal: Will not experience complications related to bowel motility ?Outcome: Progressing ?  ?

## 2019-08-19 NOTE — Plan of Care (Signed)
  Problem: Education: Goal: Knowledge of General Education information will improve Description: Including pain rating scale, medication(s)/side effects and non-pharmacologic comfort measures Outcome: Progressing   Problem: Health Behavior/Discharge Planning: Goal: Ability to manage health-related needs will improve 08/19/2019 1733 by Wallie Char, RN Outcome: Progressing 08/19/2019 1731 by Wallie Char, RN Outcome: Not Progressing   Problem: Clinical Measurements: Goal: Ability to maintain clinical measurements within normal limits will improve Outcome: Progressing Goal: Will remain free from infection 08/19/2019 1733 by Wallie Char, RN Outcome: Progressing 08/19/2019 1731 by Wallie Char, RN Outcome: Not Progressing Goal: Respiratory complications will improve Outcome: Progressing Goal: Cardiovascular complication will be avoided Outcome: Progressing   Problem: Nutrition: Goal: Adequate nutrition will be maintained Outcome: Progressing   Problem: Coping: Goal: Level of anxiety will decrease 08/19/2019 1733 by Wallie Char, RN Outcome: Progressing 08/19/2019 1731 by Wallie Char, RN Outcome: Progressing   Problem: Elimination: Goal: Will not experience complications related to bowel motility Outcome: Progressing

## 2019-08-19 NOTE — Progress Notes (Signed)
ANTICOAGULATION CONSULT NOTE - Initial Consult  Pharmacy Consult for warfarin Indication: LVAD  No Known Allergies  Patient Measurements: Height: 6\' 2"  (188 cm) Weight: 106.2 kg (234 lb 2.1 oz) IBW/kg (Calculated) : 82.2  Vital Signs: Temp: 97.6 F (36.4 C) (04/04 1246) Temp Source: Oral (04/04 1246) BP: 94/77 (04/04 1246) Pulse Rate: 39 (04/04 1200)  Labs: Recent Labs    08/17/19 0302 08/17/19 0302 08/18/19 0218 08/19/19 0251  HGB 11.0*   < > 9.8* 10.4*  HCT 35.5*  --  31.5* 33.7*  PLT 284  --  287 297  LABPROT 42.5*  --  46.7* 27.8*  INR 4.5*  --  5.0* 2.6*  CREATININE 1.55*  --  1.16 1.22   < > = values in this interval not displayed.    Estimated Creatinine Clearance: 98.2 mL/min (by C-G formula based on SCr of 1.22 mg/dL).   Medical History: Past Medical History:  Diagnosis Date  . Asthma   . CHF (congestive heart failure) (HCC)    a. 09/2016: EF 20-25% with cath showing normal cors  . GERD (gastroesophageal reflux disease)   . History of hiatal hernia   . OSA on CPAP 09/06/2018   Severe OSA with AHI 68/hr on CPAP at 12cm H2O      Assessment: 48yom with HF s/p LVAD implant HM3 place 4/19 admitted with drive line infection.   Warfarin PTA 7.5mg  MW and 5mg  all other days admit INR elevated at 5.   S/p Vit K in preparation for possible OR this week.  CBC stable, no bleeding noted, LDH 250s Started on rifampin - urine orange/red tinged  Goal of Therapy:  INR 2-2.5 Monitor platelets by anticoagulation protocol: Yes   Plan:  Holding warfarin for now Follow up plan post wound debridement  Daily INR  5/19 Pharm.D. CPP, BCPS Clinical Pharmacist [phone] 08/19/2019 3:14 PM

## 2019-08-19 NOTE — Progress Notes (Signed)
Patient ID: Jonathan Wood, male   DOB: [birth year], 49 y.o.   MRN: [id number]         [facility] for Infectious Disease  Date of Admission:  08/16/2019    Total days of antibiotics 4        Day 2 cefazolin        Day 2 rifampin         ASSESSMENT: He has MSSA infection involving his LVAD complicated by an evolving abscess.  PLAN: 1. Continue cefazolin and rifampin 2. Await results of repeat blood culture  Principal Problem:   Infection associated with driveline of left ventricular assist device (LVAD) (HCC) Active Problems:   Bacteremia due to methicillin susceptible Staphylococcus aureus (MSSA)   Elevated LFTs   LVAD (left ventricular assist device) present (HCC)   Normocytic anemia   Chronic combined systolic (congestive) and diastolic (congestive) heart failure (HCC)   OSA on CPAP   Sepsis (HCC)   Cigarette smoker   COPD (chronic obstructive pulmonary disease) (HCC)   Hypertension   Scheduled Meds: . Chlorhexidine Gluconate Cloth  6 each Topical Daily  . docusate sodium  100 mg Oral TID  . DULoxetine  30 mg Oral Daily  . feeding supplement (ENSURE ENLIVE)  237 mL Oral TID BM  . ferrous sulfate  325 mg Oral Q breakfast  . gabapentin  300 mg Oral TID  . hydrALAZINE  50 mg Oral TID  . linaclotide  145 mcg Oral QODAY  . multivitamin with minerals  1 tablet Oral Daily  . pantoprazole  40 mg Oral BID  . rifampin  600 mg Oral Daily  . sildenafil  20 mg Oral TID  . sodium chloride flush  3 mL Intravenous Q12H   Continuous Infusions: . sodium chloride    .  ceFAZolin (ANCEF) IV 2 g (08/19/19 1411)   PRN Meds:.sodium chloride, acetaminophen, albuterol, bisacodyl **OR** bisacodyl, HYDROmorphone (DILAUDID) injection, hydrOXYzine, magnesium hydroxide, Muscle Rub, ondansetron (ZOFRAN) IV, sodium chloride flush, traMADol   SUBJECTIVE: He does have an increased chest and epigastric pain.  Review of Systems: Review of Systems  Constitutional: Positive for fever.   Respiratory: Positive for cough.   Cardiovascular: Positive for chest pain.  Gastrointestinal: Positive for abdominal pain.    No Known Allergies  OBJECTIVE: Vitals:   08/19/19 1200 08/19/19 1246 08/19/19 1524 08/19/19 1547  BP:  94/77  107/76  Pulse: (!) 39     Resp: (!) 27 16 (!) 29 (!) 26  Temp:  97.6 F (36.4 C)  97.9 F (36.6 C)  TempSrc:  Oral  Oral  SpO2:  96%  100%  Weight:      Height:       Body mass index is 30.06 kg/m.  Physical Exam Constitutional:      Comments: He is uncomfortable due to pain.  Chest:       Lab Results Lab Results  Component Value Date   WBC 29.9 (H) 08/19/2019   HGB 10.4 (L) 08/19/2019   HCT 33.7 (L) 08/19/2019   MCV 92.1 08/19/2019   PLT 297 08/19/2019    Lab Results  Component Value Date   CREATININE 1.22 08/19/2019   BUN 29 (H) 08/19/2019   NA 128 (L) 08/19/2019   K 4.7 08/19/2019   CL 93 (L) 08/19/2019   CO2 24 08/19/2019    Lab Results  Component Value Date   ALT 32 08/16/2019   AST 42 (H) 08/16/2019   ALKPHOS 157 (H) 08/16/2019  BILITOT 3.0 (H) 08/16/2019     Microbiology: Recent Results (from the past 240 hour(s))  Culture, blood (routine x 2)     Status: Abnormal   Collection Time: 08/16/19  3:39 PM   Specimen: BLOOD  Result Value Ref Range Status   Specimen Description BLOOD LEFT ANTECUBITAL  Final   Special Requests   Final    BOTTLES DRAWN AEROBIC ONLY Blood Culture adequate volume   Culture  Setup Time   Final    GRAM POSITIVE COCCI IN CLUSTERS AEROBIC BOTTLE ONLY CRITICAL RESULT CALLED TO, READ BACK BY AND VERIFIED WITH: B MANCHERIL PHARMD 2041 08/17/19 A BROWNING    Culture (A)  Final    STAPHYLOCOCCUS AUREUS SUSCEPTIBILITIES PERFORMED ON PREVIOUS CULTURE WITHIN THE LAST 5 DAYS. Performed at [facility], [address] [place], [place] [address]    Report Status 08/19/2019 FINAL  Final  Culture, blood (routine x 2)     Status: Abnormal   Collection Time: 08/16/19  3:41 PM    Specimen: BLOOD  Result Value Ref Range Status   Specimen Description BLOOD RIGHT ANTECUBITAL  Final   Special Requests   Final    BOTTLES DRAWN AEROBIC AND ANAEROBIC Blood Culture adequate volume   Culture  Setup Time   Final    GRAM POSITIVE COCCI IN CLUSTERS IN BOTH AEROBIC AND ANAEROBIC BOTTLES Organism ID to follow CRITICAL RESULT CALLED TO, READ BACK BY AND VERIFIED WITHColin Rhein [facility] 2041 08/17/19 A BROWNING Performed at [facility], [address], [place], [place] [address]    Culture STAPHYLOCOCCUS AUREUS (A)  Final   Report Status 08/19/2019 FINAL  Final   Organism ID, Bacteria STAPHYLOCOCCUS AUREUS  Final      Susceptibility   Staphylococcus aureus - MIC*    CIPROFLOXACIN <=0.5 SENSITIVE Sensitive     ERYTHROMYCIN <=0.25 SENSITIVE Sensitive     GENTAMICIN <=0.5 SENSITIVE Sensitive     OXACILLIN 0.5 SENSITIVE Sensitive     TETRACYCLINE <=1 SENSITIVE Sensitive     VANCOMYCIN 1 SENSITIVE Sensitive     TRIMETH/SULFA <=10 SENSITIVE Sensitive     CLINDAMYCIN <=0.25 SENSITIVE Sensitive     RIFAMPIN <=0.5 SENSITIVE Sensitive     Inducible Clindamycin NEGATIVE Sensitive     * STAPHYLOCOCCUS AUREUS  Blood Culture ID Panel (Reflexed)     Status: Abnormal   Collection Time: 08/16/19  3:41 PM  Result Value Ref Range Status   Enterococcus species NOT DETECTED NOT DETECTED Final   Listeria monocytogenes NOT DETECTED NOT DETECTED Final   Staphylococcus species DETECTED (A) NOT DETECTED Final    Comment: CRITICAL RESULT CALLED TO, READ BACK BY AND VERIFIED WITH: B MANCHERIL PHARMD 2041 08/17/19 A BROWNING    Staphylococcus aureus (BCID) DETECTED (A) NOT DETECTED Final    Comment: Methicillin (oxacillin) susceptible Staphylococcus aureus (MSSA). Preferred therapy is anti staphylococcal beta lactam antibiotic (Cefazolin or Nafcillin), unless clinically contraindicated. CRITICAL RESULT CALLED TO, READ BACK BY AND VERIFIED WITH: B MANCHERIL PHARMD 2041 08/17/19 A BROWNING     Methicillin resistance NOT DETECTED NOT DETECTED Final   Streptococcus species NOT DETECTED NOT DETECTED Final   Streptococcus agalactiae NOT DETECTED NOT DETECTED Final   Streptococcus pneumoniae NOT DETECTED NOT DETECTED Final   Streptococcus pyogenes NOT DETECTED NOT DETECTED Final   Acinetobacter baumannii NOT DETECTED NOT DETECTED Final   Enterobacteriaceae species NOT DETECTED NOT DETECTED Final   Enterobacter cloacae complex NOT DETECTED NOT DETECTED Final   Escherichia coli NOT  DETECTED NOT DETECTED Final   Klebsiella oxytoca NOT DETECTED NOT DETECTED Final   Klebsiella pneumoniae NOT DETECTED NOT DETECTED Final   Proteus species NOT DETECTED NOT DETECTED Final   Serratia marcescens NOT DETECTED NOT DETECTED Final   Haemophilus influenzae NOT DETECTED NOT DETECTED Final   Neisseria meningitidis NOT DETECTED NOT DETECTED Final   Pseudomonas aeruginosa NOT DETECTED NOT DETECTED Final   Candida albicans NOT DETECTED NOT DETECTED Final   Candida glabrata NOT DETECTED NOT DETECTED Final   Candida krusei NOT DETECTED NOT DETECTED Final   Candida parapsilosis NOT DETECTED NOT DETECTED Final   Candida tropicalis NOT DETECTED NOT DETECTED Final    Comment: Performed at [facility], [address] [place], [place] [address]  SARS CORONAVIRUS 2 (TAT 6-24 HRS) Nasopharyngeal Nasopharyngeal Swab     Status: None   Collection Time: 08/16/19  3:54 PM   Specimen: Nasopharyngeal Swab  Result Value Ref Range Status   SARS Coronavirus 2 NEGATIVE NEGATIVE Final    Comment: (NOTE) SARS-CoV-2 target nucleic acids are NOT DETECTED. The SARS-CoV-2 RNA is generally detectable in upper and lower respiratory specimens during the acute phase of infection. Negative results do not preclude SARS-CoV-2 infection, do not rule out co-infections with other pathogens, and should not be used as the sole basis for treatment or other patient management decisions. Negative results must be combined with  clinical observations, patient history, and epidemiological information. The expected result is Negative. Fact Sheet for Patients: HairSlick.no Fact Sheet for Healthcare Providers: quierodirigir.com This test is not yet approved or cleared by the [place] FDA and  has been authorized for detection and/or diagnosis of SARS-CoV-2 by FDA under an Emergency Use Authorization (EUA). This EUA will remain  in effect (meaning this test can be used) for the duration of the COVID-19 declaration under Section 56 4(b)(1) of the Act, 21 U.S.C. section 360bbb-3(b)(1), unless the authorization is terminated or revoked sooner. Performed at [facility], [address], [place], [place] [address]   MRSA PCR Screening     Status: None   Collection Time: 08/16/19  6:41 PM   Specimen: Nasopharyngeal  Result Value Ref Range Status   MRSA by PCR NEGATIVE NEGATIVE Final    Comment:        The GeneXpert MRSA Assay (FDA approved for NASAL specimens only), is one component of a comprehensive MRSA colonization surveillance program. It is not intended to diagnose MRSA infection nor to guide or monitor treatment for MRSA infections. Performed at [facility], [address], [place], [place] [address]   Culture, blood (routine x 2)     Status: None (Preliminary result)   Collection Time: 08/18/19  2:55 PM   Specimen: BLOOD  Result Value Ref Range Status   Specimen Description BLOOD LEFT ANTECUBITAL  Final   Special Requests   Final    BOTTLES DRAWN AEROBIC AND ANAEROBIC Blood Culture adequate volume   Culture   Final    NO GROWTH < 24 HOURS Performed at [facility], [address], [place], [place] [address]    Report Status PENDING  Incomplete  Culture, blood (routine x 2)     Status: None (Preliminary result)   Collection Time: 08/18/19  3:01 PM   Specimen: BLOOD  Result Value Ref Range Status   Specimen Description  BLOOD RIGHT ANTECUBITAL  Final   Special Requests   Final    BOTTLES DRAWN AEROBIC AND ANAEROBIC Blood Culture  adequate volume   Culture   Final    NO GROWTH < 24 HOURS Performed at [facility], [address], [place], [place] [address]    Report Status PENDING  Incomplete    Cliffton Asters, MD [facility] for Infectious Disease [facility] [phone] pager   [phone] cell 08/19/2019, 4:11 PM

## 2019-08-19 NOTE — Progress Notes (Signed)
Advanced Heart Failure Rounding Note  PCP-Cardiologist: [name], MD    Patient Profile   49 y.o. 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  Subjective:    Blood cultures positive for MSSA.  ID has changed antibiotics to cefazolin and rifampin.   Was febrile overnight. Received IVF. Tmax 100.4  Feeling better today but says his subxiphoid area feels like it is going to bust. WBC 24k -> 29k  Remains tachycardic.   VAD Interrogation: Speed:5600, Flow:5.5, Power:5.0, PI:2.8,  VAD interrogated personally. Parameters stable.    CT of C/A/P 08/16/19 IMPRESSION: 1. New fluid collections along the efferent tubing from the ventricular cyst device to the ascending aorta. Findings are suspicious for infection given clinical presentation and elevated white blood cell count. 2. Stable fluid surrounding the line within the left upper quadrant anterior abdominal wall, with decreased adjacent fat stranding. Infection in this region cannot be excluded. 3. Stable cardiomegaly. 4. Stable emphysema. 5. Otherwise no acute intra-abdominal or intrapelvic process.  Objective:   Weight Range: 106.2 kg Body mass index is 30.06 kg/m.   Vital Signs:   Temp:  [97.4 F (36.3 C)-100.4 F (38 C)] 97.9 F (36.6 C) (04/04 1547) Pulse Rate:  [39-121] 39 (04/04 1200) Resp:  [16-38] 26 (04/04 1547) BP: (81-117)/(56-96) 107/76 (04/04 1547) SpO2:  [90 %-100 %] 100 % (04/04 1547) Weight:  [106.2 kg] 106.2 kg (04/04 0449) Last BM Date: 08/19/19  Weight change: Filed Weights   08/17/19 2319 08/18/19 0500 08/19/19 0449  Weight: 104.1 kg 104.8 kg 106.2 kg    Intake/Output:   Intake/Output Summary (Last 24 hours) at 08/19/2019 1613 Last data filed at 08/19/2019 1000 Gross per 24 hour  Intake 1740 ml  Output 985 ml  Net 755 ml       Physical Exam    General:  Uncomfortable appearing. Mildly tachypneic HEENT: normal Neck: supple. JVP 8-9. Carotids 2+ bilat; no bruits. No lymphadenopathy or thryomegaly appreciated. Cor: PMI nondisplaced. Regular tachy. No rubs, gallops or murmurs. + subxiphoid swelling and erythema. + tender to palpation Lungs: clear Abdomen: soft, nontender, +distended. No hepatosplenomegaly. No bruits or masses. Good bowel sounds. Extremities: no cyanosis, clubbing, rash, edema Neuro: alert & orientedx3, cranial nerves grossly intact. moves all 4 extremities w/o difficulty. Affect pleasant    Telemetry   Sinus 100-120s Personally reviewed  Labs    CBC Recent Labs    08/18/19 0218 08/19/19 0251  WBC 24.1* 29.9*  HGB 9.8* 10.4*  HCT 31.5* 33.7*  MCV 90.3 92.1  PLT 287 409   Basic Metabolic Panel Recent Labs    08/18/19 0218 08/19/19 0251  NA 127* 128*  K 4.3 4.7  CL 96* 93*  CO2 21* 24  GLUCOSE 195* 163*  BUN 29* 29*  CREATININE 1.16 1.22  CALCIUM 8.4* 8.7*   Liver Function Tests No results for input(s): AST, ALT, ALKPHOS, BILITOT, PROT, ALBUMIN in the last 72 hours. No results for input(s): LIPASE, AMYLASE in the last 72 hours. Cardiac Enzymes No results for input(s): CKTOTAL, CKMB, CKMBINDEX, TROPONINI in the last 72 hours.  BNP: BNP (last 3 results) Recent Labs    03/21/19 1415 04/23/19 1350  BNP 426.9* 709.6*    ProBNP (last 3 results) No results for input(s): PROBNP in the last 8760 hours.   D-Dimer No results for input(s): DDIMER in the  last 72 hours. Hemoglobin A1C No results for input(s): HGBA1C in the last 72 hours. Fasting Lipid Panel No results for input(s): CHOL, HDL, LDLCALC, TRIG, CHOLHDL, LDLDIRECT in the last 72 hours. Thyroid Function Tests No results for input(s): TSH, T4TOTAL, T3FREE, THYROIDAB in the last 72 hours.  Invalid input(s): FREET3  Other results:   Imaging    No results found.   Medications:     Scheduled  Medications: . Chlorhexidine Gluconate Cloth  6 each Topical Daily  . docusate sodium  100 mg Oral TID  . DULoxetine  30 mg Oral Daily  . feeding supplement (ENSURE ENLIVE)  237 mL Oral TID BM  . ferrous sulfate  325 mg Oral Q breakfast  . gabapentin  300 mg Oral TID  . hydrALAZINE  50 mg Oral TID  . linaclotide  145 mcg Oral QODAY  . multivitamin with minerals  1 tablet Oral Daily  . pantoprazole  40 mg Oral BID  . rifampin  600 mg Oral Daily  . sildenafil  20 mg Oral TID  . sodium chloride flush  3 mL Intravenous Q12H    Infusions: . sodium chloride    .  ceFAZolin (ANCEF) IV 2 g (08/19/19 1411)    PRN Medications: sodium chloride, acetaminophen, albuterol, bisacodyl **OR** bisacodyl, HYDROmorphone (DILAUDID) injection, hydrOXYzine, magnesium hydroxide, Muscle Rub, ondansetron (ZOFRAN) IV, sodium chloride flush, traMADol    Assessment/Plan    1. Fever & leukocytosis -> MSSA bacteremia - bcx 2/2 MSSA - ID has changed antibiotics to cefazolin and rifampin. - WBC 24 -> 29k - I have reviewed CT images with radiology personally.  There are new fluid collections along the driveline just outside of the pericardium as well as in the subxiphoid region.   - infection now declaring itself with development of swelling an erythema in subxiphoid region - He is not a candidate for VAD exchange due to severe RV failure and noncompliance.  - D/w Dr. Donata Clay will plan possible I&D tomorrow in OR. (check INR early am and give FFP as needed)  2.Chronic systolic HF - EF 00% also has severe RV failure.  S/p HM-3 LVAD on 09/06/17 - Volume status ok   3. HM3 LVAD 09/06/2017. - VAD interrogated personally. Parameters stable. - LDH 230 - INR 4.5 -> 5.0 -> 2.6. Has gotten 2 doses vit k (2.5, 5.0) - Will need INR < 2.0 for OR tomorrow. Hold warfarin. Get type and screen for FFP in am  - Off ASA with PUD.   4. Essential HTN - MAPs ok   5. AKI - baseline SCr ~1.2.  -Went to 1.55.   Stable 1.1-1.2 now  6. OSA - sleep study with very severe OSA (AHI 69/hr) - non-compliant CPAP  7. H/o GI bleed  - 08/2018 EGD completed and showed gastritis and nonbleeding duodenal ulcers, colonoscopy with 5 polyps removed. Protonix was increased to BID.  - No recent GI bleeding - hgb stable 10.4  - He will stay off aspirin for now.   8. Tobacco Abuse  - says he quit recently  9. Severe protein calorie malnutrition  - nutrition seeing    Length of Stay: 3  Arvilla Meres, MD  08/19/2019, 4:13 PM  Advanced Heart Failure Team Pager [phone] (M-F; 7a - 4p)  Please contact [facility] for night-coverage after hours (4p -7a ) and weekends on [email or web address]

## 2019-08-19 NOTE — Plan of Care (Signed)

## 2019-08-20 ENCOUNTER — Inpatient Hospital Stay (HOSPITAL_COMMUNITY): Payer: Medicare Other | Admitting: Anesthesiology

## 2019-08-20 ENCOUNTER — Encounter (HOSPITAL_COMMUNITY)
Admission: EM | Disposition: A | Discharge status: Home Health | Payer: Self-pay | Source: Ambulatory Visit | Attending: Internal Medicine

## 2019-08-20 DIAGNOSIS — A4101 Sepsis due to Methicillin susceptible Staphylococcus aureus: Secondary | ICD-10-CM | POA: Diagnosis not present

## 2019-08-20 DIAGNOSIS — I9789 Other postprocedural complications and disorders of the circulatory system, not elsewhere classified: Secondary | ICD-10-CM

## 2019-08-20 DIAGNOSIS — I5023 Acute on chronic systolic (congestive) heart failure: Secondary | ICD-10-CM | POA: Diagnosis not present

## 2019-08-20 DIAGNOSIS — T827XXA Infection and inflammatory reaction due to other cardiac and vascular devices, implants and grafts, initial encounter: Secondary | ICD-10-CM | POA: Diagnosis not present

## 2019-08-20 DIAGNOSIS — Z20822 Contact with and (suspected) exposure to covid-19: Secondary | ICD-10-CM | POA: Diagnosis not present

## 2019-08-20 HISTORY — PX: STERNAL WOUND DEBRIDEMENT: SHX1058

## 2019-08-20 LAB — PREPARE RBC (CROSSMATCH)

## 2019-08-20 LAB — BASIC METABOLIC PANEL
Anion gap: 12 (ref 5–15)
BUN: 33 mg/dL — ABNORMAL HIGH (ref 6–20)
CO2: 21 mmol/L — ABNORMAL LOW (ref 22–32)
Calcium: 8.8 mg/dL — ABNORMAL LOW (ref 8.9–10.3)
Chloride: 93 mmol/L — ABNORMAL LOW (ref 98–111)
Creatinine, Ser: 1.22 mg/dL (ref 0.61–1.24)
GFR calc Af Amer: >60 mL/min (ref >60)
GFR calc non Af Amer: >60 mL/min (ref >60)
Glucose, Bld: 176 mg/dL — ABNORMAL HIGH (ref 70–99)
Potassium: 4.9 mmol/L (ref 3.5–5.1)
Sodium: 126 mmol/L — ABNORMAL LOW (ref 135–145)

## 2019-08-20 LAB — CBC
HCT: 31.2 % — ABNORMAL LOW (ref 39.0–52.0)
Hemoglobin: 9.9 g/dL — ABNORMAL LOW (ref 13.0–17.0)
MCH: 29.3 pg (ref 26.0–34.0)
MCHC: 31.7 g/dL (ref 30.0–36.0)
MCV: 92.3 fL (ref 80.0–100.0)
Platelets: 312 10*3/uL (ref 150–400)
RBC: 3.38 MIL/uL — ABNORMAL LOW (ref 4.22–5.81)
RDW: 16.6 % — ABNORMAL HIGH (ref 11.5–15.5)
WBC: 34.6 10*3/uL — ABNORMAL HIGH (ref 4.0–10.5)
nRBC: 0.3 % — ABNORMAL HIGH (ref 0.0–0.2)

## 2019-08-20 LAB — COMPREHENSIVE METABOLIC PANEL
ALT: 13 U/L (ref 0–44)
AST: 28 U/L (ref 15–41)
Albumin: 2.3 g/dL — ABNORMAL LOW (ref 3.5–5.0)
Alkaline Phosphatase: 198 U/L — ABNORMAL HIGH (ref 38–126)
Anion gap: 10 (ref 5–15)
BUN: 33 mg/dL — ABNORMAL HIGH (ref 6–20)
CO2: 22 mmol/L (ref 22–32)
Calcium: 8.9 mg/dL (ref 8.9–10.3)
Chloride: 94 mmol/L — ABNORMAL LOW (ref 98–111)
Creatinine, Ser: 1.24 mg/dL (ref 0.61–1.24)
GFR calc Af Amer: >60 mL/min (ref >60)
GFR calc non Af Amer: >60 mL/min (ref >60)
Glucose, Bld: 146 mg/dL — ABNORMAL HIGH (ref 70–99)
Potassium: 5.4 mmol/L — ABNORMAL HIGH (ref 3.5–5.1)
Sodium: 126 mmol/L — ABNORMAL LOW (ref 135–145)
Total Bilirubin: 2.6 mg/dL — ABNORMAL HIGH (ref 0.3–1.2)
Total Protein: 8.5 g/dL — ABNORMAL HIGH (ref 6.5–8.1)

## 2019-08-20 LAB — PROTIME-INR
INR: 1.7 — ABNORMAL HIGH (ref 0.8–1.2)
Prothrombin Time: 19.5 seconds — ABNORMAL HIGH (ref 11.4–15.2)

## 2019-08-20 LAB — LACTATE DEHYDROGENASE: LDH: 271 U/L — ABNORMAL HIGH (ref 98–192)

## 2019-08-20 SURGERY — DEBRIDEMENT, WOUND, STERNUM
Anesthesia: General

## 2019-08-20 MED ORDER — PHENYLEPHRINE 40 MCG/ML (10ML) SYRINGE FOR IV PUSH (FOR BLOOD PRESSURE SUPPORT)
PREFILLED_SYRINGE | INTRAVENOUS | Status: AC
  Filled 2019-08-20: qty 10

## 2019-08-20 MED ORDER — ROCURONIUM BROMIDE 10 MG/ML (PF) SYRINGE
PREFILLED_SYRINGE | INTRAVENOUS | Status: AC
  Filled 2019-08-20: qty 10

## 2019-08-20 MED ORDER — SODIUM CHLORIDE 0.9 % IV SOLN: INTRAVENOUS | Status: DC | PRN

## 2019-08-20 MED ORDER — VANCOMYCIN HCL 1000 MG IV SOLR
INTRAVENOUS | Status: DC | PRN
  Administered 2019-08-20: 1000 mg

## 2019-08-20 MED ORDER — SODIUM CHLORIDE 0.9 % IR SOLN
Status: DC | PRN
  Administered 2019-08-20: 1000 mL

## 2019-08-20 MED ORDER — CEFAZOLIN SODIUM-DEXTROSE 2-4 GM/100ML-% IV SOLN: 2.0000 g | INTRAVENOUS | Status: DC

## 2019-08-20 MED ORDER — PIPERACILLIN-TAZOBACTAM 3.375 G IVPB
3.3750 g | Freq: Once | INTRAVENOUS | Status: AC
  Administered 2019-08-20: 3.375 g via INTRAVENOUS
  Filled 2019-08-20: qty 50

## 2019-08-20 MED ORDER — ACETAMINOPHEN 500 MG PO TABS
1000.0000 mg | ORAL_TABLET | Freq: Four times a day (QID) | ORAL | Status: AC
  Administered 2019-08-20 – 2019-08-25 (×15): 1000 mg via ORAL
  Filled 2019-08-20 (×15): qty 2

## 2019-08-20 MED ORDER — DIPHENHYDRAMINE HCL 12.5 MG/5ML PO ELIX: 12.5000 mg | ORAL_SOLUTION | Freq: Four times a day (QID) | ORAL | Status: DC | PRN

## 2019-08-20 MED ORDER — VANCOMYCIN HCL 1000 MG IV SOLR
INTRAVENOUS | Status: AC
  Filled 2019-08-20: qty 1000

## 2019-08-20 MED ORDER — PROPOFOL 10 MG/ML IV BOLUS
INTRAVENOUS | Status: DC | PRN
  Administered 2019-08-20: 50 mg via INTRAVENOUS

## 2019-08-20 MED ORDER — ROCURONIUM BROMIDE 10 MG/ML (PF) SYRINGE
PREFILLED_SYRINGE | INTRAVENOUS | Status: DC | PRN
  Administered 2019-08-20: 60 mg via INTRAVENOUS

## 2019-08-20 MED ORDER — LACTATED RINGERS IV SOLN: INTRAVENOUS | Status: DC | PRN

## 2019-08-20 MED ORDER — LIDOCAINE 2% (20 MG/ML) 5 ML SYRINGE
INTRAMUSCULAR | Status: AC
  Filled 2019-08-20: qty 5

## 2019-08-20 MED ORDER — ONDANSETRON HCL 4 MG/2ML IJ SOLN
4.0000 mg | Freq: Four times a day (QID) | INTRAMUSCULAR | Status: DC | PRN
  Administered 2019-09-05: 4 mg via INTRAVENOUS

## 2019-08-20 MED ORDER — SODIUM CHLORIDE 0.9% FLUSH: 9.0000 mL | INTRAVENOUS | Status: DC | PRN

## 2019-08-20 MED ORDER — FENTANYL CITRATE (PF) 250 MCG/5ML IJ SOLN
INTRAMUSCULAR | Status: AC
  Filled 2019-08-20: qty 5

## 2019-08-20 MED ORDER — OXYCODONE HCL 5 MG PO TABS
5.0000 mg | ORAL_TABLET | ORAL | Status: DC | PRN
  Administered 2019-08-27 – 2019-09-07 (×12): 10 mg via ORAL
  Filled 2019-08-20 (×14): qty 2

## 2019-08-20 MED ORDER — PHENYLEPHRINE HCL-NACL 10-0.9 MG/250ML-% IV SOLN: 0.0000 ug/min | INTRAVENOUS | Status: DC

## 2019-08-20 MED ORDER — DEXTROSE-NACL 5-0.45 % IV SOLN: INTRAVENOUS | Status: DC

## 2019-08-20 MED ORDER — SENNOSIDES-DOCUSATE SODIUM 8.6-50 MG PO TABS
1.0000 | ORAL_TABLET | Freq: Every day | ORAL | Status: DC
  Administered 2019-08-20 – 2019-09-05 (×9): 1 via ORAL
  Filled 2019-08-20 (×14): qty 1

## 2019-08-20 MED ORDER — MIDAZOLAM HCL 2 MG/2ML IJ SOLN
INTRAMUSCULAR | Status: AC
  Filled 2019-08-20: qty 2

## 2019-08-20 MED ORDER — NALOXONE HCL 0.4 MG/ML IJ SOLN: 0.4000 mg | INTRAMUSCULAR | Status: DC | PRN

## 2019-08-20 MED ORDER — FENTANYL 50 MCG/ML IV PCA SOLN
INTRAVENOUS | Status: DC
  Administered 2019-08-20 – 2019-08-21 (×2): 180 ug via INTRAVENOUS
  Administered 2019-08-21: 75 ug via INTRAVENOUS
  Administered 2019-08-21: 90 ug via INTRAVENOUS
  Filled 2019-08-20: qty 20

## 2019-08-20 MED ORDER — ACETAMINOPHEN 160 MG/5ML PO SOLN: 1000.0000 mg | Freq: Four times a day (QID) | ORAL | Status: AC

## 2019-08-20 MED ORDER — FENTANYL CITRATE (PF) 250 MCG/5ML IJ SOLN
INTRAMUSCULAR | Status: DC | PRN
  Administered 2019-08-20: 50 ug via INTRAVENOUS
  Administered 2019-08-20: 100 ug via INTRAVENOUS

## 2019-08-20 MED ORDER — DIPHENHYDRAMINE HCL 50 MG/ML IJ SOLN: 12.5000 mg | Freq: Four times a day (QID) | INTRAMUSCULAR | Status: DC | PRN

## 2019-08-20 MED ORDER — METOCLOPRAMIDE HCL 5 MG/ML IJ SOLN
10.0000 mg | Freq: Four times a day (QID) | INTRAMUSCULAR | Status: AC
  Administered 2019-08-20 – 2019-08-25 (×17): 10 mg via INTRAVENOUS
  Filled 2019-08-20 (×17): qty 2

## 2019-08-20 MED ORDER — BISACODYL 5 MG PO TBEC
10.0000 mg | DELAYED_RELEASE_TABLET | Freq: Every day | ORAL | Status: DC
  Administered 2019-08-20 – 2019-08-31 (×9): 10 mg via ORAL
  Filled 2019-08-20 (×12): qty 2

## 2019-08-20 MED ORDER — LIDOCAINE 2% (20 MG/ML) 5 ML SYRINGE
INTRAMUSCULAR | Status: DC | PRN
  Administered 2019-08-20: 60 mg via INTRAVENOUS

## 2019-08-20 MED ORDER — ONDANSETRON HCL 4 MG/2ML IJ SOLN
INTRAMUSCULAR | Status: DC | PRN
  Administered 2019-08-20: 4 mg via INTRAVENOUS

## 2019-08-20 MED ORDER — HEMOSTATIC AGENTS (NO CHARGE) OPTIME
TOPICAL | Status: DC | PRN
  Administered 2019-08-20: 1 via TOPICAL

## 2019-08-20 MED ORDER — VANCOMYCIN HCL 1000 MG IV SOLR
INTRAVENOUS | Status: AC
  Filled 2019-08-20 (×2): qty 1000

## 2019-08-20 MED ORDER — SUGAMMADEX SODIUM 200 MG/2ML IV SOLN
INTRAVENOUS | Status: DC | PRN
  Administered 2019-08-20: 200 mg via INTRAVENOUS

## 2019-08-20 MED ORDER — ONDANSETRON HCL 4 MG/2ML IJ SOLN
INTRAMUSCULAR | Status: AC
  Filled 2019-08-20: qty 2

## 2019-08-20 MED ORDER — MIDAZOLAM HCL 2 MG/2ML IJ SOLN
INTRAMUSCULAR | Status: DC | PRN
  Administered 2019-08-20 (×2): 1 mg via INTRAVENOUS

## 2019-08-20 MED ORDER — PROPOFOL 10 MG/ML IV BOLUS
INTRAVENOUS | Status: AC
  Filled 2019-08-20: qty 40

## 2019-08-20 SURGICAL SUPPLY — 49 items
ATTRACTOMAT 16X20 MAGNETIC DRP (DRAPES) ×2 IMPLANT
BAG DECANTER FOR FLEXI CONT (MISCELLANEOUS) ×2 IMPLANT
BLADE CLIPPER SURG (BLADE) ×2 IMPLANT
BLADE SURG 10 STRL SS (BLADE) ×2 IMPLANT
BNDG GAUZE ELAST 4 BULKY (GAUZE/BANDAGES/DRESSINGS) ×1 IMPLANT
CANISTER SUCT 3000ML PPV (MISCELLANEOUS) ×2 IMPLANT
CANISTER WOUNDNEG PRESSURE 500 (CANNISTER) ×1 IMPLANT
CLIP VESOCCLUDE SM WIDE 24/CT (CLIP) IMPLANT
CLIP VESOCCLUDE SM WIDE 6/CT (CLIP) ×1 IMPLANT
CNTNR URN SCR LID CUP LEK RST (MISCELLANEOUS) IMPLANT
CONT SPEC 4OZ STRL OR WHT (MISCELLANEOUS)
COVER SURGICAL LIGHT HANDLE (MISCELLANEOUS) ×4 IMPLANT
DRAPE INCISE IOBAN 66X45 STRL (DRAPES) ×1 IMPLANT
DRAPE LAPAROSCOPIC ABDOMINAL (DRAPES) ×2 IMPLANT
DRAPE ORTHO SPLIT 77X108 STRL (DRAPES) ×4
DRAPE SURG ORHT 6 SPLT 77X108 (DRAPES) IMPLANT
DRSG PAD ABDOMINAL 8X10 ST (GAUZE/BANDAGES/DRESSINGS) ×1 IMPLANT
ELECT BLADE 4.0 EZ CLEAN MEGAD (MISCELLANEOUS) ×2
ELECT REM PT RETURN 9FT ADLT (ELECTROSURGICAL) ×2
ELECTRODE BLDE 4.0 EZ CLN MEGD (MISCELLANEOUS) IMPLANT
ELECTRODE REM PT RTRN 9FT ADLT (ELECTROSURGICAL) ×1 IMPLANT
GAUZE SPONGE 4X4 12PLY STRL (GAUZE/BANDAGES/DRESSINGS) ×2 IMPLANT
GLOVE BIO SURGEON STRL SZ7.5 (GLOVE) ×2 IMPLANT
GLOVE BIOGEL PI IND STRL 7.5 (GLOVE) IMPLANT
GLOVE BIOGEL PI INDICATOR 7.5 (GLOVE) ×2
GLOVE SURG SS PI 7.0 STRL IVOR (GLOVE) ×2 IMPLANT
GOWN STRL REUS W/ TWL LRG LVL3 (GOWN DISPOSABLE) ×4 IMPLANT
GOWN STRL REUS W/TWL LRG LVL3 (GOWN DISPOSABLE) ×8
KIT BASIN OR (CUSTOM PROCEDURE TRAY) ×2 IMPLANT
KIT TURNOVER KIT B (KITS) ×2 IMPLANT
NS IRRIG 1000ML POUR BTL (IV SOLUTION) ×2 IMPLANT
PACK CHEST (CUSTOM PROCEDURE TRAY) ×2 IMPLANT
PAD ARMBOARD 7.5X6 YLW CONV (MISCELLANEOUS) ×4 IMPLANT
SET INTERPULSE LAVAGE W/TIP (ORTHOPEDIC DISPOSABLE SUPPLIES) ×1 IMPLANT
SOL PREP POV-IOD 4OZ 10% (MISCELLANEOUS) IMPLANT
SPONGE LAP 18X18 RF (DISPOSABLE) ×2 IMPLANT
SUT SILK 2 0 SH CR/8 (SUTURE) ×1 IMPLANT
SUT VIC AB 1 CTX 36 (SUTURE) ×4
SUT VIC AB 1 CTX36XBRD ANBCTR (SUTURE) ×2 IMPLANT
SUT VIC AB 2-0 CTX 27 (SUTURE) ×4 IMPLANT
SUT VIC AB 3-0 X1 27 (SUTURE) ×4 IMPLANT
SWAB COLLECTION DEVICE MRSA (MISCELLANEOUS) ×1 IMPLANT
SWAB CULTURE ESWAB REG 1ML (MISCELLANEOUS) IMPLANT
TOWEL GREEN STERILE (TOWEL DISPOSABLE) ×2 IMPLANT
TOWEL GREEN STERILE FF (TOWEL DISPOSABLE) ×2 IMPLANT
TRAY FOLEY W/BAG SLVR 16FR (SET/KITS/TRAYS/PACK) ×2
TRAY FOLEY W/BAG SLVR 16FR ST (SET/KITS/TRAYS/PACK) IMPLANT
WATER STERILE IRR 1000ML POUR (IV SOLUTION) ×2 IMPLANT
YANKAUER SUCT BULB TIP NO VENT (SUCTIONS) ×1 IMPLANT

## 2019-08-20 NOTE — Transfer of Care (Signed)
Immediate Anesthesia Transfer of Care Note  Patient: Jonathan Wood  Procedure(s) Performed: DEBRIDEMENT OF LVAD DRIVELINE TUNNEL (N/A )  Patient Location: ICU  Anesthesia Type:General  Level of Consciousness: awake and alert   Airway & Oxygen Therapy: Patient Spontanous Breathing and Patient connected to nasal cannula oxygen  Post-op Assessment: Report given to RN and Post -op Vital signs reviewed and stable  Post vital signs: Reviewed and stable  Last Vitals:  Vitals Value Taken Time  BP    Temp    Pulse    Resp    SpO2      Last Pain:  Vitals:   08/20/19 1104  TempSrc:   PainSc: 4       Patients Stated Pain Goal: 2 (08/20/19 5396)  Complications: No apparent anesthesia complications

## 2019-08-20 NOTE — Progress Notes (Signed)
LVAD Coordinator Rounding Note:  Admitted 08/16/19 by Dr. [name] due to VAD drive line infection/pneumonia/sepsis  HM III LVAD implanted on 09/06/17 by Dr. Cyndia Bent under Destination Therapy criteria.  Pt is lying in bed, states he is still having pain in his sternal area. Pt states he is "scared" and isn't sure what is going to happen. Explained to pt that he is going to the OR today for debridement of the infection in his chest with Dr. Prescott Gum.     Vital signs: Temp: 97.7 HR: 110 Doppler Pressure: 94 Automatic BP:  100/88 (94) O2 Sat: 96% RA Wt: 227.5>231>235  lbs   LVAD interrogation reveals:  Speed: 5600 Flow: 5.5 Power:  4.4w PI: 3.0 Alarms: none Events: none Hematocrit: 31  Fixed speed: 5600 Low speed limit: 5300  Drive Line: CDI. Twice weekly dressing changes using daily kit w/siliver strip by bedside nurse. Next dressing change due 08/20/19-today.  Labs:  LDH trend: 227>230>271  INR trend: 4.5>5.0>1.7  WBC: 27.3>26.2>24>34.6  Infection: - blood cultures 08/17/19>>staphylococcus aureus   Anticoagulation Plan: -INR Goal: 2.0 - 2.5  -ASA Dose: none  Adverse Events: - driveline cx 82/7/07>> staph aureus   Plan/Recommendations:  1. Call VAD pager if any VAD equipment or drive line issues. 2. Twice weekly dressing changes by bedside nurse. Next dressing change due 08/20/19.  [name] RN, VAD Coordinator 24/7 VAD Pager: [phone]

## 2019-08-20 NOTE — Anesthesia Preprocedure Evaluation (Addendum)
Anesthesia Evaluation  Patient identified by MRN, date of birth, ID band Patient awake    Reviewed: Allergy & Precautions, NPO status , Patient's Chart, lab work & pertinent test results  History of Anesthesia Complications Negative for: history of anesthetic complications  Airway Mallampati: III  TM Distance: >3 FB Neck ROM: Full    Dental  (+) Dental Advisory Given   Pulmonary asthma , sleep apnea and Continuous Positive Airway Pressure Ventilation , Current Smoker,    Pulmonary exam normal        Cardiovascular +CHF  Normal cardiovascular exam  IMPRESSIONS    1. Left ventricular ejection fraction, by visual estimation, is 10%. The  left ventricle has severely decreased function. There is no left  ventricular hypertrophy. LVAD inflow cannula was visualized. The  interventricular septum was mid-line to slightly  rightward bowed.  2. Moderately dilated left ventricular internal cavity size.  3. The left ventricle demonstrates global hypokinesis.  4. Left ventricular diastolic parameters are consistent with Grade II  diastolic dysfunction (pseudonormalization).  5. Global right ventricle has moderately reduced systolic function.The  right ventricular size is moderately enlarged. No increase in right  ventricular wall thickness.  6. The aortic valve is tricuspid. Aortic valve regurgitation is trivial.  The aortic valve was not seen to open.  7. The mitral valve is normal in structure. Mild to moderate mitral valve  regurgitation. No evidence of mitral stenosis.  8. The tricuspid valve is normal in structure. Tricuspid valve  regurgitation is trivial.  9. The pulmonic valve was normal in structure. Pulmonic valve  regurgitation is trivial.  10. Left atrial size was mildly dilated.  11. Right atrial size was moderately dilated.  12. The inferior vena cava is dilated in size with <50% respiratory  variability,  suggesting right atrial pressure of 15 mmHg.  13. The tricuspid regurgitant velocity is 2.33 m/s, and with an assumed  right atrial pressure of 15 mmHg, the estimated right ventricular systolic  pressure is mildly elevated at 36.7 mmHg.    Neuro/Psych Anxiety negative neurological ROS     GI/Hepatic Neg liver ROS, hiatal hernia, GERD  Medicated and Controlled,  Endo/Other  negative endocrine ROS  Renal/GU Renal InsufficiencyRenal disease     Musculoskeletal negative musculoskeletal ROS (+)   Abdominal   Peds  Hematology  (+) anemia ,   Anesthesia Other Findings Blood Loss Anemia, rectal bleeding, LVAD  Reproductive/Obstetrics                           Anesthesia Physical  Anesthesia Plan  ASA: IV  Anesthesia Plan: General   Post-op Pain Management:    Induction: Intravenous  PONV Risk Score and Plan: 2 and Treatment may vary due to age or medical condition, Ondansetron and Dexamethasone  Airway Management Planned: Oral ETT  Additional Equipment: Arterial line and CVP  Intra-op Plan:   Post-operative Plan: Extubation in OR and Possible Post-op intubation/ventilation  Informed Consent: I have reviewed the patients History and Physical, chart, labs and discussed the procedure including the risks, benefits and alternatives for the proposed anesthesia with the patient or authorized representative who has indicated his/her understanding and acceptance.     Dental advisory given  Plan Discussed with: CRNA and Anesthesiologist  Anesthesia Plan Comments:        Anesthesia Quick Evaluation

## 2019-08-20 NOTE — Anesthesia Postprocedure Evaluation (Signed)
Anesthesia Post Note  Patient: Jonathan Wood  Procedure(s) Performed: DEBRIDEMENT OF LVAD DRIVELINE TUNNEL (N/A )     Patient location during evaluation: SICU Anesthesia Type: General Level of consciousness: sedated Pain management: pain level controlled Vital Signs Assessment: post-procedure vital signs reviewed and stable Respiratory status: spontaneous breathing and respiratory function stable Cardiovascular status: stable Postop Assessment: no apparent nausea or vomiting Anesthetic complications: no    Last Vitals:  Vitals:   08/20/19 0800 08/20/19 0900  BP: 100/88 (!) 109/93  Pulse: (!) 110 (!) 109  Resp: (!) 22 (!) 23  Temp:    SpO2:      Last Pain:  Vitals:   08/20/19 1104  TempSrc:   PainSc: 4                  Dalilah Curlin DANIEL

## 2019-08-20 NOTE — Progress Notes (Addendum)
Advanced Heart Failure Rounding Note  PCP-Cardiologist: Arvilla Meres, MD    Patient Profile   49 y.o. 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  Subjective:    Blood cultures positive for MSSA.  On cefazolin and rifampin. ID following.   + Low grade fever past 24 hrs. Max temp 99.7.   WBCs continue to rise, 24>>29>>34K  INR down to 1.7 today.   Feels uncomfortable. Continues w/ lower sternal/ abdominal pain. Hurts to take in a big deep breath.   VAD Interrogation: Speed:5600, Flow:5.4, Power:4.3, PI:2.9, 2 PI events  VAD interrogated personally. Parameters stable.   CT of C/A/P 08/16/19 IMPRESSION: 1. New fluid collections along the efferent tubing from the ventricular cyst device to the ascending aorta. Findings are suspicious for infection given clinical presentation and elevated white blood cell count. 2. Stable fluid surrounding the line within the left upper quadrant anterior abdominal wall, with decreased adjacent fat stranding. Infection in this region cannot be excluded. 3. Stable cardiomegaly. 4. Stable emphysema. 5. Otherwise no acute intra-abdominal or intrapelvic process.  Objective:   Weight Range: 106.6 kg Body mass index is 30.17 kg/m.   Vital Signs:   Temp:  [97.4 F (36.3 C)-99.7 F (37.6 C)] 97.7 F (36.5 C) (04/05 0701) Pulse Rate:  [39-121] 110 (04/05 0800) Resp:  [16-35] 22 (04/05 0800) BP: (91-112)/(39-90) 100/88 (04/05 0800) SpO2:  [94 %-100 %] 96 % (04/05 0701) Weight:  [106.6 kg] 106.6 kg (04/05 0700) Last BM Date: 08/19/19  Weight change: Filed Weights   08/18/19 0500 08/19/19 0449 08/20/19 0700  Weight: 104.8 kg 106.2 kg 106.6 kg    Intake/Output:   Intake/Output Summary (Last 24 hours) at 08/20/2019 0921 Last data filed at 08/20/2019 0100 Gross per 24 hour    Intake 900.06 ml  Output 325 ml  Net 575.06 ml      Physical Exam    General:  Uncomfortable appearing [name] laying in bed. Mildly tachypneic HEENT: normal Neck: supple. No JVD. Carotids 2+ bilat; no bruits. No lymphadenopathy or thryomegaly appreciated. Cor: +LVAD HUM, + subxiphoid swelling and erythema. + tender to palpation Lungs: clear Abdomen: soft, nontender, mildly distended. No hepatosplenomegaly. No bruits or masses. Good bowel sounds. Drive line site: covered w/ clean/dry bandage  Extremities: no cyanosis, clubbing, rash, edema Neuro: alert & orientedx3, cranial nerves grossly intact. moves all 4 extremities w/o difficulty. Affect pleasant    Telemetry   Sinus tach 110s Personally reviewed  Labs    CBC Recent Labs    08/19/19 0251 08/20/19 0205  WBC 29.9* 34.6*  HGB 10.4* 9.9*  HCT 33.7* 31.2*  MCV 92.1 92.3  PLT 297 312   Basic Metabolic Panel Recent Labs    59/56/38 0251 08/20/19 0205  NA 128* 126*  K 4.7 4.9  CL 93* 93*  CO2 24 21*  GLUCOSE 163* 176*  BUN 29* 33*  CREATININE 1.22 1.22  CALCIUM 8.7* 8.8*   Liver Function Tests No results for input(s): AST, ALT, ALKPHOS, BILITOT, PROT, ALBUMIN in the last 72 hours. No results for input(s): LIPASE, AMYLASE in the last 72 hours. Cardiac Enzymes No results for input(s): CKTOTAL, CKMB, CKMBINDEX, TROPONINI in the last 72 hours.  BNP: BNP (last 3 results) Recent Labs    03/21/19 1415 04/23/19 1350  BNP 426.9* 709.6*    ProBNP (last 3 results) No  results for input(s): PROBNP in the last 8760 hours.   D-Dimer No results for input(s): DDIMER in the last 72 hours. Hemoglobin A1C No results for input(s): HGBA1C in the last 72 hours. Fasting Lipid Panel No results for input(s): CHOL, HDL, LDLCALC, TRIG, CHOLHDL, LDLDIRECT in the last 72 hours. Thyroid Function Tests No results for input(s): TSH, T4TOTAL, T3FREE, THYROIDAB in the last 72 hours.  Invalid input(s): FREET3  Other  results:   Imaging    No results found.   Medications:     Scheduled Medications: . Chlorhexidine Gluconate Cloth  6 each Topical Daily  . docusate sodium  100 mg Oral TID  . DULoxetine  30 mg Oral Daily  . feeding supplement (ENSURE ENLIVE)  237 mL Oral TID BM  . ferrous sulfate  325 mg Oral Q breakfast  . gabapentin  300 mg Oral TID  . hydrALAZINE  50 mg Oral TID  . linaclotide  145 mcg Oral QODAY  . multivitamin with minerals  1 tablet Oral Daily  . pantoprazole  40 mg Oral BID  . rifampin  600 mg Oral Daily  . sildenafil  20 mg Oral TID  . sodium chloride flush  3 mL Intravenous Q12H    Infusions: . sodium chloride    .  ceFAZolin (ANCEF) IV 2 g (08/20/19 0545)  .  ceFAZolin (ANCEF) IV    . piperacillin-tazobactam (ZOSYN)  IV      PRN Medications: sodium chloride, acetaminophen, albuterol, bisacodyl **OR** bisacodyl, HYDROmorphone (DILAUDID) injection, hydrOXYzine, magnesium hydroxide, Muscle Rub, ondansetron (ZOFRAN) IV, sodium chloride flush, traMADol    Assessment/Plan    1. Fever & leukocytosis -> MSSA bacteremia - bcx 2/2 MSSA - ID has changed antibiotics to cefazolin and rifampin. - WBC 24 -> 29->34K  - I have reviewed CT images with radiology personally.  There are new fluid collections along the driveline just outside of the pericardium as well as in the subxiphoid region.   - infection now declaring itself with development of swelling and erythema in subxiphoid region - He is not a candidate for VAD exchange due to severe RV failure and noncompliance.  - D/w Dr. Prescott Gum. Will plan I&D today in OR. (INR 1.7) - Will expand abx. Will give dose of zosyn prior to OR. Pharmacy to dose.   2.Chronic systolic HF - EF 98% also has severe RV failure.  S/p HM-3 LVAD on 09/06/17 - Volume status ok   3. HM3 LVAD 09/06/2017. - VAD interrogated personally. Parameters stable. - LDH 271 - INR 4.5 -> 5.0 -> 2.6->1.7. Has gotten 2 doses vit k (2.5, 5.0) -  continue to hold coumadin for OR. Resume once all surgeries/ procedures complete  - Off ASA with PUD.   4. Essential HTN - MAPs ok  - hold hydralazine and sildenafil prior to OR to reduce risk of hypotension   5. AKI - baseline SCr ~1.2.  -peaked at 1.55 this admit.  Stable 1.1-1.2 now  6. OSA - sleep study with very severe OSA (AHI 69/hr) - non-compliant CPAP  7. H/o GI bleed  - 08/2018 EGD completed and showed gastritis and nonbleeding duodenal ulcers, colonoscopy with 5 polyps removed. Protonix was increased to BID.  - No recent GI bleeding - hgb stable 9-10  - He will stay off aspirin for now.   8. Tobacco Abuse  - says he quit recently  77. Severe protein calorie malnutrition  - nutrition seeing    Length of Stay: 4  Brittainy  Sharol Harness, PA-C  08/20/2019, 9:21 AM  Advanced Heart Failure Team Pager [phone] (M-F; 7a - 4p)  Please contact [facility] for night-coverage after hours (4p -7a ) and weekends on [email or web address]  Patient seen and examined with the above-signed Advanced Practice Provider and/or Housestaff. I personally reviewed laboratory data, imaging studies and relevant notes. I independently examined the patient and formulated the important aspects of the plan. I have edited the note to reflect any of my changes or salient points. I have personally discussed the plan with the patient and/or family.  Still with subxiphoid pain and fullness. Low-grade fevers. WBC up to 34k despite ancef.   General: Uncomfortable HEENT: normal  Neck: supple. JVP not elevated.  Carotids 2+ bilat; no bruits. No lymphadenopathy or thryomegaly appreciated. Cor: LVAD hum.  Subxiphoid area full, erythematous and tender to touch Lungs: Clear. Abdomen: obese soft, nontender, + distended. No hepatosplenomegaly. No bruits or masses. Good bowel sounds. Driveline site clean. Anchor in place.  Extremities: no cyanosis, clubbing, rash. Warm no edema  Neuro: alert & oriented x 3. No focal  deficits. Moves all 4 without problem   He has significant subxiphoid abscess. WBC rising. Will add Zosyn for broader coverage temporarily. D/W Dr. Donata Clay. Wil lgo to OR today for I&D. INR 1.7. VAD interrogated personally. Parameters stable.  Arvilla Meres, MD  1:49 PM

## 2019-08-20 NOTE — Plan of Care (Signed)
  Problem: Education: Goal: Knowledge of General Education information will improve Description: Including pain rating scale, medication(s)/side effects and non-pharmacologic comfort measures Outcome: Progressing   Problem: Health Behavior/Discharge Planning: Goal: Ability to manage health-related needs will improve Outcome: Progressing   Problem: Activity: Goal: Risk for activity intolerance will decrease Outcome: Progressing   

## 2019-08-20 NOTE — Anesthesia Procedure Notes (Signed)
Procedure Name: Intubation Date/Time: 08/20/2019 2:42 PM Performed by: Valda Favia, CRNA Pre-anesthesia Checklist: Patient identified, Emergency Drugs available, Suction available and Patient being monitored Patient Re-evaluated:Patient Re-evaluated prior to induction Oxygen Delivery Method: Circle System Utilized Preoxygenation: Pre-oxygenation with 100% oxygen Induction Type: IV induction Ventilation: Mask ventilation without difficulty Laryngoscope Size: Mac and 4 Grade View: Grade II Tube type: Oral Tube size: 7.5 mm Number of attempts: 1 Airway Equipment and Method: Stylet Placement Confirmation: ETT inserted through vocal cords under direct vision,  positive ETCO2 and breath sounds checked- equal and bilateral Secured at: 20 cm Tube secured with: Tape Dental Injury: Teeth and Oropharynx as per pre-operative assessment

## 2019-08-20 NOTE — Progress Notes (Signed)
[facility]  Date of Admission:  08/16/2019     Total days of antibiotics 5         ASSESSMENT:  Jonathan Wood has MSSA infection of his LVAD complicated by the development of abscess and bacteremia. Repeat cultures from 4/3 are without growth. Plan for I&D to aid in source control. Continue current dose of cefazolin and rifampin. Will require prolonged IV therapy and chronic suppression.   PLAN:  1. Continue cefazolin and rifampin. 2. OR today with CVTS 3. Therapeutic monitoring of warfarin levels per pharmacy protocol.   Principal Problem:   Infection associated with driveline of left ventricular assist device (LVAD) (HCC) Active Problems:   Elevated LFTs   LVAD (left ventricular assist device) present (HCC)   Normocytic anemia   Chronic combined systolic (congestive) and diastolic (congestive) heart failure (HCC)   OSA on CPAP   Sepsis (HCC)   Cigarette smoker   COPD (chronic obstructive pulmonary disease) (HCC)   Hypertension   Bacteremia due to methicillin susceptible Staphylococcus aureus (MSSA)   . Chlorhexidine Gluconate Cloth  6 each Topical Daily  . docusate sodium  100 mg Oral TID  . DULoxetine  30 mg Oral Daily  . feeding supplement (ENSURE ENLIVE)  237 mL Oral TID BM  . ferrous sulfate  325 mg Oral Q breakfast  . gabapentin  300 mg Oral TID  . hydrALAZINE  50 mg Oral TID  . linaclotide  145 mcg Oral QODAY  . multivitamin with minerals  1 tablet Oral Daily  . pantoprazole  40 mg Oral BID  . rifampin  600 mg Oral Daily  . sildenafil  20 mg Oral TID  . sodium chloride flush  3 mL Intravenous Q12H  . vancomycin 1000 mg in NS (1000 ml) irrigation for Dr. Cornelius Moras case   Irrigation To OR    SUBJECTIVE:  Afebrile overnight with continued leukocytosis. No acute events overnight. Plans for OR today. Feeling uncomfortable and having pain. Just received Tylenol.   No Known Allergies   Review of Systems: Review of Systems    Constitutional: Negative for chills, fever and weight loss.  Respiratory: Negative for cough, shortness of breath and wheezing.   Cardiovascular: Positive for chest pain. Negative for leg swelling.  Gastrointestinal: Negative for abdominal pain, constipation, diarrhea, nausea and vomiting.  Skin: Negative for rash.      OBJECTIVE: Vitals:   08/20/19 0700 08/20/19 0701 08/20/19 0800 08/20/19 0900  BP:  (!) 91/49 100/88 (!) 109/93  Pulse:  (!) 110 (!) 110 (!) 109  Resp:  (!) 26 (!) 22 (!) 23  Temp:  97.7 F (36.5 C)    TempSrc:  Oral    SpO2:  96%    Weight: 106.6 kg     Height:       Body mass index is 30.17 kg/m.  Physical Exam Constitutional:      General: He is not in acute distress.    Appearance: He is well-developed.  Cardiovascular:     Rate and Rhythm: Normal rate and regular rhythm.     Comments: LVAD hum present.  Pulmonary:     Effort: Pulmonary effort is normal.     Breath sounds: Normal breath sounds.  Skin:    General: Skin is warm and dry.  Neurological:     Mental Status: He is alert and oriented to person, place, and time.  Psychiatric:        Mood and Affect: Mood normal.  Lab Results Lab Results  Component Value Date   WBC 34.6 (H) 08/20/2019   HGB 9.9 (L) 08/20/2019   HCT 31.2 (L) 08/20/2019   MCV 92.3 08/20/2019   PLT 312 08/20/2019    Lab Results  Component Value Date   CREATININE 1.22 08/20/2019   BUN 33 (H) 08/20/2019   NA 126 (L) 08/20/2019   K 4.9 08/20/2019   CL 93 (L) 08/20/2019   CO2 21 (L) 08/20/2019    Lab Results  Component Value Date   ALT 32 08/16/2019   AST 42 (H) 08/16/2019   ALKPHOS 157 (H) 08/16/2019   BILITOT 3.0 (H) 08/16/2019     Microbiology: Recent Results (from the past 240 hour(s))  Culture, blood (routine x 2)     Status: Abnormal   Collection Time: 08/16/19  3:39 PM   Specimen: BLOOD  Result Value Ref Range Status   Specimen Description BLOOD LEFT ANTECUBITAL  Final   Special Requests    Final    BOTTLES DRAWN AEROBIC ONLY Blood Culture adequate volume   Culture  Setup Time   Final    GRAM POSITIVE COCCI IN CLUSTERS AEROBIC BOTTLE ONLY CRITICAL RESULT CALLED TO, READ BACK BY AND VERIFIED WITH: B MANCHERIL PHARMD 2041 08/17/19 A BROWNING    Culture (A)  Final    STAPHYLOCOCCUS AUREUS SUSCEPTIBILITIES PERFORMED ON PREVIOUS CULTURE WITHIN THE LAST 5 DAYS. Performed at [facility], [place] [address] [place], [place] [address]    Report Status 08/19/2019 FINAL  Final  Culture, blood (routine x 2)     Status: Abnormal   Collection Time: 08/16/19  3:41 PM   Specimen: BLOOD  Result Value Ref Range Status   Specimen Description BLOOD RIGHT ANTECUBITAL  Final   Special Requests   Final    BOTTLES DRAWN AEROBIC AND ANAEROBIC Blood Culture adequate volume   Culture  Setup Time   Final    GRAM POSITIVE COCCI IN CLUSTERS IN BOTH AEROBIC AND ANAEROBIC BOTTLES Organism ID to follow CRITICAL RESULT CALLED TO, READ BACK BY AND VERIFIED WITHAsencion Islam [facility] 2041 08/17/19 A BROWNING Performed at [facility], [place] [address], [place],  [address]    Culture STAPHYLOCOCCUS AUREUS (A)  Final   Report Status 08/19/2019 FINAL  Final   Organism ID, Bacteria STAPHYLOCOCCUS AUREUS  Final      Susceptibility   Staphylococcus aureus - MIC*    CIPROFLOXACIN <=0.5 SENSITIVE Sensitive     ERYTHROMYCIN <=0.25 SENSITIVE Sensitive     GENTAMICIN <=0.5 SENSITIVE Sensitive     OXACILLIN 0.5 SENSITIVE Sensitive     TETRACYCLINE <=1 SENSITIVE Sensitive     VANCOMYCIN 1 SENSITIVE Sensitive     TRIMETH/SULFA <=10 SENSITIVE Sensitive     CLINDAMYCIN <=0.25 SENSITIVE Sensitive     RIFAMPIN <=0.5 SENSITIVE Sensitive     Inducible Clindamycin NEGATIVE Sensitive     * STAPHYLOCOCCUS AUREUS  Blood Culture ID Panel (Reflexed)     Status: Abnormal   Collection Time: 08/16/19  3:41 PM  Result Value Ref Range Status   Enterococcus species NOT DETECTED NOT DETECTED Final   Listeria  monocytogenes NOT DETECTED NOT DETECTED Final   Staphylococcus species DETECTED (A) NOT DETECTED Final    Comment: CRITICAL RESULT CALLED TO, READ BACK BY AND VERIFIED WITH: B MANCHERIL PHARMD 2041 08/17/19 A BROWNING    Staphylococcus aureus (BCID) DETECTED (A) NOT DETECTED Final    Comment: Methicillin (oxacillin) susceptible Staphylococcus aureus (MSSA). Preferred therapy is  anti staphylococcal beta lactam antibiotic (Cefazolin or Nafcillin), unless clinically contraindicated. CRITICAL RESULT CALLED TO, READ BACK BY AND VERIFIED WITH: B MANCHERIL PHARMD 2041 08/17/19 A BROWNING    Methicillin resistance NOT DETECTED NOT DETECTED Final   Streptococcus species NOT DETECTED NOT DETECTED Final   Streptococcus agalactiae NOT DETECTED NOT DETECTED Final   Streptococcus pneumoniae NOT DETECTED NOT DETECTED Final   Streptococcus pyogenes NOT DETECTED NOT DETECTED Final   Acinetobacter baumannii NOT DETECTED NOT DETECTED Final   Enterobacteriaceae species NOT DETECTED NOT DETECTED Final   Enterobacter cloacae complex NOT DETECTED NOT DETECTED Final   Escherichia coli NOT DETECTED NOT DETECTED Final   Klebsiella oxytoca NOT DETECTED NOT DETECTED Final   Klebsiella pneumoniae NOT DETECTED NOT DETECTED Final   Proteus species NOT DETECTED NOT DETECTED Final   Serratia marcescens NOT DETECTED NOT DETECTED Final   Haemophilus influenzae NOT DETECTED NOT DETECTED Final   Neisseria meningitidis NOT DETECTED NOT DETECTED Final   Pseudomonas aeruginosa NOT DETECTED NOT DETECTED Final   Candida albicans NOT DETECTED NOT DETECTED Final   Candida glabrata NOT DETECTED NOT DETECTED Final   Candida krusei NOT DETECTED NOT DETECTED Final   Candida parapsilosis NOT DETECTED NOT DETECTED Final   Candida tropicalis NOT DETECTED NOT DETECTED Final    Comment: Performed at [facility], [address] [place], [place] [address]  SARS CORONAVIRUS 2 (TAT 6-24 HRS) Nasopharyngeal Nasopharyngeal Swab      Status: None   Collection Time: 08/16/19  3:54 PM   Specimen: Nasopharyngeal Swab  Result Value Ref Range Status   SARS Coronavirus 2 NEGATIVE NEGATIVE Final    Comment: (NOTE) SARS-CoV-2 target nucleic acids are NOT DETECTED. The SARS-CoV-2 RNA is generally detectable in upper and lower respiratory specimens during the acute phase of infection. Negative results do not preclude SARS-CoV-2 infection, do not rule out co-infections with other pathogens, and should not be used as the sole basis for treatment or other patient management decisions. Negative results must be combined with clinical observations, patient history, and epidemiological information. The expected result is Negative. Fact Sheet for Patients: HairSlick.no Fact Sheet for Healthcare Providers: quierodirigir.com This test is not yet approved or cleared by the [place] FDA and  has been authorized for detection and/or diagnosis of SARS-CoV-2 by FDA under an Emergency Use Authorization (EUA). This EUA will remain  in effect (meaning this test can be used) for the duration of the COVID-19 declaration under Section 56 4(b)(1) of the Act, 21 U.S.C. section 360bbb-3(b)(1), unless the authorization is terminated or revoked sooner. Performed at [facility], [address] [place], [place] [address]   MRSA PCR Screening     Status: None   Collection Time: 08/16/19  6:41 PM   Specimen: Nasopharyngeal  Result Value Ref Range Status   MRSA by PCR NEGATIVE NEGATIVE Final    Comment:        The GeneXpert MRSA Assay (FDA approved for NASAL specimens only), is one component of a comprehensive MRSA colonization surveillance program. It is not intended to diagnose MRSA infection nor to guide or monitor treatment for MRSA infections. Performed at [facility], [address], [place], [place] [address]   Culture, blood (routine x 2)     Status: None  (Preliminary result)   Collection Time: 08/18/19  2:55 PM   Specimen: BLOOD  Result Value Ref Range Status   Specimen Description BLOOD LEFT ANTECUBITAL  Final   Special Requests  Final    BOTTLES DRAWN AEROBIC AND ANAEROBIC Blood Culture adequate volume   Culture   Final    NO GROWTH < 24 HOURS Performed at [facility], [address] [place], [place] [address]    Report Status PENDING  Incomplete  Culture, blood (routine x 2)     Status: None (Preliminary result)   Collection Time: 08/18/19  3:01 PM   Specimen: BLOOD  Result Value Ref Range Status   Specimen Description BLOOD RIGHT ANTECUBITAL  Final   Special Requests   Final    BOTTLES DRAWN AEROBIC AND ANAEROBIC Blood Culture adequate volume   Culture   Final    NO GROWTH < 24 HOURS Performed at [facility], [address], [place], [place] [address]    Report Status PENDING  Incomplete     Marcos Eke, NP [facility] [phone] Pager  08/20/2019  10:24 AM

## 2019-08-20 NOTE — Progress Notes (Signed)
LVAD coordinator at bedside. Patient alert and oriented x 4.

## 2019-08-20 NOTE — Brief Op Note (Signed)
08/20/2019  4:01 PM  PATIENT:  Jonathan Wood  49 y.o. male  PRE-OPERATIVE DIAGNOSIS:  VAD DRIVE LINE TUNNEL INFECTION  POST-OPERATIVE DIAGNOSIS:  VAD DRIVE LINE TUNNEL INFECTION  PROCEDURE:  Procedure(s): DEBRIDEMENT OF LVAD DRIVELINE TUNNEL (N/A)  SURGEON:  Surgeon(s) and Role:    Kerin Perna, MD - Primary  PHYSICIAN ASSISTANT:   ASSISTANTS: none   ANESTHESIA:   general  EBL:  0 mL   BLOOD ADMINISTERED:none  DRAINS: none   LOCAL MEDICATIONS USED:  NONE  SPECIMEN:  Excision  DISPOSITION OF SPECIMEN:  microbiology  COUNTS:  YES  TOURNIQUET:  * No tourniquets in log *  DICTATION: .Dragon Dictation  PLAN OF CARE: return to 2H  PATIENT DISPOSITION:  ICU - extubated and stable.   Delay start of Pharmacological VTE agent (>24hrs) due to surgical blood loss or risk of bleeding: yes

## 2019-08-20 NOTE — Plan of Care (Signed)

## 2019-08-20 NOTE — Anesthesia Procedure Notes (Signed)
Central Venous Catheter Insertion Performed by: Heather Roberts, MD, anesthesiologist Start/End4/09/2019 2:13 PM, 08/20/2019 2:23 PM Patient location: Pre-op. Preanesthetic checklist: patient identified, IV checked, site marked, risks and benefits discussed, surgical consent, monitors and equipment checked, pre-op evaluation, timeout performed and anesthesia consent Position: Trendelenburg Lidocaine 1% used for infiltration and patient sedated Hand hygiene performed , maximum sterile barriers used  and Seldinger technique used Catheter size: 8 Fr Total catheter length 16. Central line was placed.Double lumen Procedure performed using ultrasound guided technique. Ultrasound Notes:anatomy identified, needle tip was noted to be adjacent to the nerve/plexus identified, no ultrasound evidence of intravascular and/or intraneural injection and image(s) printed for medical record Attempts: 1 Following insertion, dressing applied, line sutured and Biopatch. Post procedure assessment: blood return through all ports, free fluid flow and no air  Patient tolerated the procedure well with no immediate complications.

## 2019-08-20 NOTE — Anesthesia Procedure Notes (Signed)
Arterial Line Insertion Start/End4/09/2019 2:54 PM, 08/20/2019 3:02 PM Performed by: Heather Roberts, MD, anesthesiologist  Patient location: Pre-op. Preanesthetic checklist: patient identified, IV checked, site marked, risks and benefits discussed, surgical consent, monitors and equipment checked, pre-op evaluation, timeout performed and anesthesia consent Lidocaine 1% used for infiltration Right, radial was placed Catheter size: 20 Fr Hand hygiene performed  and maximum sterile barriers used   Attempts: 1 Procedure performed using ultrasound guided technique. Ultrasound Notes:anatomy identified, needle tip was noted to be adjacent to the nerve/plexus identified, no ultrasound evidence of intravascular and/or intraneural injection and image(s) printed for medical record Following insertion, dressing applied. Post procedure assessment: normal and unchanged

## 2019-08-20 NOTE — Progress Notes (Signed)
VAD Coordinator Procedure Note:   VAD Coordinator met patient in 2C to transfer to OR holding. Pt undergoing sternal wound debridement per Dr. Van Trigt. Hemodynamics and VAD parameters monitored by myself and anesthesia throughout the procedure. Blood pressures were obtained with arterial line on the right wrist.    Time: A-line Auto  BP Flow PI Power Speed  Pre-procedure:  1415  106/76 5.4 2.8 4.3 5600                    Sedation Induction: 1439  106/83(92) 5.3 3.2 4.3 5600   1445  92/70(78) 4.9 2.8 4.1 5600   1500 108/90  4.7 3.2 4.1 5600   1515 115/94(107)  4.7 3.4 4.1 5600   1530 104/83(99)  4.9 3.0 4.1 5600   1545 111/95(104)  4.9 3.4 4.2 5600   1600                                                     Recovery Area: Pt transferred directly to 2H                   Patient tolerated the procedure well. When pt woke up he removed arterial line, was c/o of need to urinate pt informed he had a foley catheter.  Patient Disposition: in 2 H, handoff given to Erin, RN.     RN, BSN VAD Coordinator 24/7 Pager [phone]   

## 2019-08-20 NOTE — Op Note (Signed)
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  ANESTHESIA:  General by Dr. Adonis Huguenin.  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  He presented for his 2-year followup.  He was found to have an elevated white count.  He was subsequently admitted to the  cardiology service for further increase in white count and upper abdominal tenderness and fever.  He was admitted, cultured and placed on IV antibiotics.  Blood cultures positive for gram-positive cocci.  CT scan showed a 2 x 3 cm collection along the  power cord-driveline in the subxiphoid region.  His INR was supratherapeutic and required FFP and vitamin K to reduce it to less than 2.0.  He was prepared for incision and drainage of the abscess in the operating room under general anesthesia.  I  discussed the procedure in detail with the patient, including the expected benefits and risks of bleeding, recurrent infection, and further sepsis.  He agreed to proceed with surgery under what I felt was informed consent.  DESCRIPTION OF PROCEDURE:  The patient was brought from preop holding where informed consent was documented and final issues about the procedure were discussed with the patient.  He was placed supine on the operating table and  general anesthesia was  induced.  A right IJ line and right radial artery line were placed by the anesthesia team.  The patient was prepped from the neck down to the thighs and draped as a sterile field.  A proper time-out was performed.  An incision was made over the lower sternal region in subxiphoid area through the previous scar.  After dividing the subcutaneous layer, an abscess cavity was encountered with gross purulent material.  This was sent for cultures, routine anaerobic,  aerobic and AFB, fungal.  Incision was continued until the cavity was completely unroofed.  This was cleared with irrigation.  Beneath the xiphoid, the power cord could be palpated, as well as the outflow graft.  There was a space in that area which was  opened carefully with blunt dissection and irrigated of purulent material.  Next, a space extended obliquely towards the left upper quadrant following the driveline tunnel and this was opened, debrided and irrigated as well.  Finally, 1 L of vancomycin loaded liter bag of saline was pulse lavaged to clean out the entire wound.  Because of the deep size of the wound with several areas to pack, I used a Kerlix soaked in vancomycin irrigation rather than a wound VAC sponge.  Dry dressings and then an [place] barrier dressing was placed over the wound.  The patient was then reversed  from anesthesia and returned to the ICU in stable condition.  VN/NUANCE  D:08/20/2019 T:08/20/2019 JOB:[id number]

## 2019-08-20 NOTE — Progress Notes (Signed)
Day of Surgery Procedure(s) (LRB): DEBRIDEMENT OF CHEST WOUND (N/A) Subjective: INR < 2 Plan lower sternal wound debridement Procedure d/w patient  Objective: Vital signs in last 24 hours: Temp:  [97.4 F (36.3 C)-99.7 F (37.6 C)] 97.7 F (36.5 C) (04/05 0701) Pulse Rate:  [39-121] 109 (04/05 0900) Cardiac Rhythm: Sinus tachycardia (04/05 0700) Resp:  [16-35] 23 (04/05 0900) BP: (91-112)/(39-93) 109/93 (04/05 0900) SpO2:  [94 %-100 %] 96 % (04/05 0701) Weight:  [106.6 kg] 106.6 kg (04/05 0700)  Hemodynamic parameters for last 24 hours:    Intake/Output from previous day: 04/04 0701 - 04/05 0700 In: 1140.1 [P.O.:937; I.V.:3; IV Piggyback:200.1] Out: 485 [Urine:485] Intake/Output this shift: No intake/output data recorded.  EXAM Tender over VAD power cord tunnel Normal VAD Hum Neuro intact Lab Results: Recent Labs    08/19/19 0251 08/20/19 0205  WBC 29.9* 34.6*  HGB 10.4* 9.9*  HCT 33.7* 31.2*  PLT 297 312   BMET:  Recent Labs    08/19/19 0251 08/20/19 0205  NA 128* 126*  K 4.7 4.9  CL 93* 93*  CO2 24 21*  GLUCOSE 163* 176*  BUN 29* 33*  CREATININE 1.22 1.22  CALCIUM 8.7* 8.8*    PT/INR:  Recent Labs    08/20/19 0205  LABPROT 19.5*  INR 1.7*   ABG    Component Value Date/Time   PHART 7.405 09/08/2017 0049   HCO3 23.4 09/08/2017 0049   TCO2 32 09/12/2017 1605   ACIDBASEDEF 1.0 09/08/2017 0049   O2SAT 60.9 09/19/2017 0445   CBG (last 3)  No results for input(s): GLUCAP in the last 72 hours.  Assessment/Plan: S/P Procedure(s) (LRB): DEBRIDEMENT OF CHEST WOUND (N/A) OR later today   LOS: 4 days    Jonathan Wood 08/20/2019

## 2019-08-21 ENCOUNTER — Encounter: Payer: Self-pay | Admitting: *Deleted

## 2019-08-21 ENCOUNTER — Inpatient Hospital Stay (HOSPITAL_COMMUNITY): Payer: Medicare Other

## 2019-08-21 LAB — COOXEMETRY PANEL
Carboxyhemoglobin: 1.7 % — ABNORMAL HIGH (ref 0.5–1.5)
Methemoglobin: 0.6 % (ref 0.0–1.5)
O2 Saturation: 73.1 %
Total hemoglobin: 9.4 g/dL — ABNORMAL LOW (ref 12.0–16.0)

## 2019-08-21 LAB — BASIC METABOLIC PANEL
Anion gap: 11 (ref 5–15)
BUN: 34 mg/dL — ABNORMAL HIGH (ref 6–20)
CO2: 25 mmol/L (ref 22–32)
Calcium: 8.6 mg/dL — ABNORMAL LOW (ref 8.9–10.3)
Chloride: 92 mmol/L — ABNORMAL LOW (ref 98–111)
Creatinine, Ser: 1.17 mg/dL (ref 0.61–1.24)
GFR calc Af Amer: >60 mL/min (ref >60)
GFR calc non Af Amer: >60 mL/min (ref >60)
Glucose, Bld: 124 mg/dL — ABNORMAL HIGH (ref 70–99)
Potassium: 4.8 mmol/L (ref 3.5–5.1)
Sodium: 128 mmol/L — ABNORMAL LOW (ref 135–145)

## 2019-08-21 LAB — CBC
HCT: 30.5 % — ABNORMAL LOW (ref 39.0–52.0)
Hemoglobin: 9.3 g/dL — ABNORMAL LOW (ref 13.0–17.0)
MCH: 28.4 pg (ref 26.0–34.0)
MCHC: 30.5 g/dL (ref 30.0–36.0)
MCV: 93.3 fL (ref 80.0–100.0)
Platelets: 307 10*3/uL (ref 150–400)
RBC: 3.27 MIL/uL — ABNORMAL LOW (ref 4.22–5.81)
RDW: 17.1 % — ABNORMAL HIGH (ref 11.5–15.5)
WBC: 25.8 10*3/uL — ABNORMAL HIGH (ref 4.0–10.5)
nRBC: 0.8 % — ABNORMAL HIGH (ref 0.0–0.2)

## 2019-08-21 LAB — LACTATE DEHYDROGENASE: LDH: 245 U/L — ABNORMAL HIGH (ref 98–192)

## 2019-08-21 LAB — GLUCOSE, CAPILLARY: Glucose-Capillary: 144 mg/dL — ABNORMAL HIGH (ref 70–99)

## 2019-08-21 LAB — PROTIME-INR
INR: 1.7 — ABNORMAL HIGH (ref 0.8–1.2)
Prothrombin Time: 20.2 seconds — ABNORMAL HIGH (ref 11.4–15.2)

## 2019-08-21 MED ORDER — MELATONIN 5 MG PO TABS
5.0000 mg | ORAL_TABLET | Freq: Once | ORAL | Status: DC
  Filled 2019-08-21: qty 1

## 2019-08-21 MED ORDER — SODIUM CHLORIDE 0.9% FLUSH: 9.0000 mL | INTRAVENOUS | Status: DC | PRN

## 2019-08-21 MED ORDER — ORAL CARE MOUTH RINSE
15.0000 mL | Freq: Two times a day (BID) | OROMUCOSAL | Status: DC
  Administered 2019-08-21 – 2019-09-12 (×33): 15 mL via OROMUCOSAL

## 2019-08-21 MED ORDER — ONDANSETRON HCL 4 MG/2ML IJ SOLN: 4.0000 mg | Freq: Four times a day (QID) | INTRAMUSCULAR | Status: DC | PRN

## 2019-08-21 MED ORDER — HYDROMORPHONE 1 MG/ML IV SOLN
INTRAVENOUS | Status: DC
  Administered 2019-08-21: 1.4 mg via INTRAVENOUS
  Administered 2019-08-21: 0.9 mg via INTRAVENOUS
  Administered 2019-08-21: 0.6 mg via INTRAVENOUS
  Administered 2019-08-21: 30 mg via INTRAVENOUS
  Administered 2019-08-22: 1.2 mg via INTRAVENOUS
  Administered 2019-08-22: 3 mg via INTRAVENOUS
  Administered 2019-08-22: 0.6 mg via INTRAVENOUS
  Administered 2019-08-22: 0.3 mg via INTRAVENOUS
  Administered 2019-08-22 – 2019-08-23 (×2): 0.6 mg via INTRAVENOUS
  Administered 2019-08-23 (×3): 0.9 mg via INTRAVENOUS
  Administered 2019-08-23: 0.6 mg via INTRAVENOUS
  Administered 2019-08-23: 0.3 mg via INTRAVENOUS
  Administered 2019-08-24: 0.6 mg via INTRAVENOUS
  Administered 2019-08-24: 30 mg via INTRAVENOUS
  Administered 2019-08-24 – 2019-08-25 (×2): 0.6 mg via INTRAVENOUS
  Administered 2019-08-25: 0.9 mg via INTRAVENOUS
  Administered 2019-08-25: 0.6 mg via INTRAVENOUS
  Administered 2019-08-25 (×2): 0.3 mg via INTRAVENOUS
  Administered 2019-08-26: 0.9 mg via INTRAVENOUS
  Administered 2019-08-26: 1.2 mg via INTRAVENOUS
  Administered 2019-08-26: 0.3 mg via INTRAVENOUS
  Administered 2019-08-27: 0.9 mg via INTRAVENOUS
  Administered 2019-08-27: 0 mg via INTRAVENOUS
  Administered 2019-08-27 (×2): 0.6 mg via INTRAVENOUS
  Administered 2019-08-27: 0.3 mg via INTRAVENOUS
  Administered 2019-08-27 – 2019-08-28 (×2): 0.6 mg via INTRAVENOUS
  Administered 2019-08-28: 1.2 mg via INTRAVENOUS
  Administered 2019-08-28: 30 mg via INTRAVENOUS
  Administered 2019-08-28: 0.9 mg via INTRAVENOUS
  Administered 2019-08-28: 0.6 mg via INTRAVENOUS
  Administered 2019-08-29: 2.1 mg via INTRAVENOUS
  Administered 2019-08-29: 0.6 mg via INTRAVENOUS
  Administered 2019-08-29: 1.2 mg via INTRAVENOUS
  Administered 2019-08-29: 0.9 mg via INTRAVENOUS
  Administered 2019-08-29 (×3): 0.6 mg via INTRAVENOUS
  Administered 2019-08-30: 1.2 mg via INTRAVENOUS
  Administered 2019-08-30: 0.6 mg via INTRAVENOUS
  Administered 2019-08-30: 30 mg via INTRAVENOUS
  Administered 2019-08-30: 0.6 mg via INTRAVENOUS
  Administered 2019-08-31: 0 mL via INTRAVENOUS
  Administered 2019-08-31: 0.3 mg via INTRAVENOUS
  Filled 2019-08-21 (×5): qty 30

## 2019-08-21 MED ORDER — SODIUM CHLORIDE 0.9% FLUSH: 10.0000 mL | INTRAVENOUS | Status: DC | PRN

## 2019-08-21 MED ORDER — DIPHENHYDRAMINE HCL 12.5 MG/5ML PO ELIX
12.5000 mg | ORAL_SOLUTION | Freq: Four times a day (QID) | ORAL | Status: DC | PRN
  Administered 2019-08-25 – 2019-08-28 (×2): 12.5 mg via ORAL
  Filled 2019-08-21 (×2): qty 5

## 2019-08-21 MED ORDER — DIPHENHYDRAMINE HCL 50 MG/ML IJ SOLN: 12.5000 mg | Freq: Four times a day (QID) | INTRAMUSCULAR | Status: DC | PRN

## 2019-08-21 MED ORDER — MELATONIN 3 MG PO TABS
4.5000 mg | ORAL_TABLET | Freq: Once | ORAL | Status: AC
  Administered 2019-08-21: 02:00:00 4.5 mg via ORAL
  Filled 2019-08-21: qty 2

## 2019-08-21 MED ORDER — SODIUM CHLORIDE 0.9% FLUSH
10.0000 mL | Freq: Two times a day (BID) | INTRAVENOUS | Status: DC
  Administered 2019-08-21 – 2019-08-31 (×11): 10 mL

## 2019-08-21 MED ORDER — LORAZEPAM 2 MG/ML IJ SOLN: 1.0000 mg | Freq: Once | INTRAMUSCULAR | Status: DC

## 2019-08-21 MED ORDER — NALOXONE HCL 0.4 MG/ML IJ SOLN: 0.4000 mg | INTRAMUSCULAR | Status: DC | PRN

## 2019-08-21 NOTE — Progress Notes (Addendum)
Advanced Heart Failure Rounding Note  PCP-Cardiologist: Arvilla Meres, MD    Patient Profile   49 y.o. 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  Subjective:    Blood cultures positive for MSSA.  On cefazolin and rifampin. ID following.   WBC improving, down from 34>>25K. AF.   4/5, S/p I&D w/ excisional debridement of subxiphoid abscess. Cultures pending   Continues w/ lower sternal/ abdominal. On PCA pump.   VAD Interrogation: Speed:5600, Flow:5.1, Power:4.3, PI:3.1, no PI events  VAD interrogated personally. Parameters stable.   CT of C/A/P 08/16/19 IMPRESSION: 1. New fluid collections along the efferent tubing from the ventricular cyst device to the ascending aorta. Findings are suspicious for infection given clinical presentation and elevated white blood cell count. 2. Stable fluid surrounding the line within the left upper quadrant anterior abdominal wall, with decreased adjacent fat stranding. Infection in this region cannot be excluded. 3. Stable cardiomegaly. 4. Stable emphysema. 5. Otherwise no acute intra-abdominal or intrapelvic process.  Objective:   Weight Range: 106.1 kg Body mass index is 30.03 kg/m.   Vital Signs:   Temp:  [96.9 F (36.1 C)-98.1 F (36.7 C)] 97.7 F (36.5 C) (04/06 0811) Pulse Rate:  [71-153] 96 (04/06 0630) Resp:  [14-44] 19 (04/06 0721) BP: (69-117)/(50-98) 104/80 (04/06 0630) SpO2:  [89 %-100 %] 100 % (04/06 0721) Weight:  [106.1 kg] 106.1 kg (04/06 0500) Last BM Date: 08/19/19  Weight change: Filed Weights   08/19/19 0449 08/20/19 0700 08/21/19 0500  Weight: 106.2 kg 106.6 kg 106.1 kg    Intake/Output:   Intake/Output Summary (Last 24 hours) at 08/21/2019 0944 Last data filed at 08/21/2019 0818 Gross per 24 hour  Intake 3147.11 ml  Output 550 ml  Net  2597.11 ml      Physical Exam    General:  Uncomfortable appearing [name] laying in bed. No respiratory difficulty  HEENT: normal Neck: supple. No JVD. Carotids 2+ bilat; no bruits. No lymphadenopathy or thryomegaly appreciated. Cor: +LVAD HUM, sternal area/ driveline site covered w/ dry dressings and Ioban barrier dressing Lungs: clear, no wheezing  Abdomen: markedly distended, mildly tender No hepatosplenomegaly. No bruits or masses. Good bowel sounds. Drive line site: covered w/ clean/dry bandage  Extremities: no cyanosis, clubbing, rash, edema Neuro: alert & orientedx3, cranial nerves grossly intact. moves all 4 extremities w/o difficulty. Affect pleasant    Labs    CBC Recent Labs    08/20/19 0205 08/21/19 0507  WBC 34.6* 25.8*  HGB 9.9* 9.3*  HCT 31.2* 30.5*  MCV 92.3 93.3  PLT 312 307   Basic Metabolic Panel Recent Labs    03/55/97 1215 08/21/19 0507  NA 126* 128*  K 5.4* 4.8  CL 94* 92*  CO2 22 25  GLUCOSE 146* 124*  BUN 33* 34*  CREATININE 1.24 1.17  CALCIUM 8.9 8.6*   Liver Function Tests Recent Labs    08/20/19 1215  AST 28  ALT 13  ALKPHOS 198*  BILITOT 2.6*  PROT 8.5*  ALBUMIN 2.3*   No results for input(s): LIPASE, AMYLASE in the last 72 hours. Cardiac Enzymes No results for input(s): CKTOTAL, CKMB, CKMBINDEX, TROPONINI in the last 72 hours.  BNP: BNP (last 3 results) Recent Labs    03/21/19 1415 04/23/19 1350  BNP 426.9* 709.6*    ProBNP (last 3 results) No results for  input(s): PROBNP in the last 8760 hours.   D-Dimer No results for input(s): DDIMER in the last 72 hours. Hemoglobin A1C No results for input(s): HGBA1C in the last 72 hours. Fasting Lipid Panel No results for input(s): CHOL, HDL, LDLCALC, TRIG, CHOLHDL, LDLDIRECT in the last 72 hours. Thyroid Function Tests No results for input(s): TSH, T4TOTAL, T3FREE, THYROIDAB in the last 72 hours.  Invalid input(s): FREET3  Other results:   Imaging    DG Chest  Port 1 View  Result Date: 08/21/2019 CLINICAL DATA:  Sore chest. EXAM: PORTABLE CHEST 1 VIEW COMPARISON:  08/16/2019. FINDINGS: Right IJ line noted with tip over SVC. Prior median sternotomy. Left ventricular assist device in stable position. Stable cardiomegaly. Mild pulmonary venous congestion and interstitial prominence. Small bilateral pleural effusions cannot be excluded. No pneumothorax. IMPRESSION: 1.  Right IJ line noted with tip over the SVC. 2. Prior median sternotomy. Left ventricular assist device in stable position. Stable cardiomegaly. Mild pulmonary venous congestion interstitial prominence. Small bilateral pleural effusions cannot be excluded. Findings suggest CHF. Electronically Signed   By: Maisie Fus  Register   On: 08/21/2019 08:40     Medications:     Scheduled Medications: . acetaminophen  1,000 mg Oral Q6H   Or  . acetaminophen (TYLENOL) oral liquid 160 mg/5 mL  1,000 mg Oral Q6H  . bisacodyl  10 mg Oral Daily  . Chlorhexidine Gluconate Cloth  6 each Topical Daily  . docusate sodium  100 mg Oral TID  . DULoxetine  30 mg Oral Daily  . feeding supplement (ENSURE ENLIVE)  237 mL Oral TID BM  . ferrous sulfate  325 mg Oral Q breakfast  . gabapentin  300 mg Oral TID  . hydrALAZINE  50 mg Oral TID  . HYDROmorphone   Intravenous Q4H  . linaclotide  145 mcg Oral QODAY  . mouth rinse  15 mL Mouth Rinse BID  . metoCLOPramide (REGLAN) injection  10 mg Intravenous Q6H  . multivitamin with minerals  1 tablet Oral Daily  . pantoprazole  40 mg Oral BID  . rifampin  600 mg Oral Daily  . senna-docusate  1 tablet Oral QHS  . sildenafil  20 mg Oral TID  . sodium chloride flush  10-40 mL Intracatheter Q12H  . sodium chloride flush  3 mL Intravenous Q12H    Infusions: . sodium chloride    .  ceFAZolin (ANCEF) IV Stopped (08/21/19 0549)  . dextrose 5 % and 0.45% NaCl 50 mL/hr at 08/21/19 0700  . phenylephrine (NEO-SYNEPHRINE) Adult infusion      PRN Medications: sodium chloride,  acetaminophen, albuterol, bisacodyl **OR** bisacodyl, diphenhydrAMINE **OR** diphenhydrAMINE, hydrOXYzine, magnesium hydroxide, naloxone **AND** sodium chloride flush, ondansetron (ZOFRAN) IV, ondansetron (ZOFRAN) IV, oxyCODONE, sodium chloride flush, sodium chloride flush, traMADol    Assessment/Plan    1. Fever & leukocytosis -> MSSA bacteremia/ Subxiphoid Abscess  - bcx 2/2 MSSA - ID has changed antibiotics to cefazolin and rifampin. - WBC 24 -> 29->34K-> 25K  - He is not a candidate for VAD exchange due to severe RV failure and noncompliance.  - S/p I&D on 4/5. Plan return to OR tomorrow for debridement of driveline - Continue abx per  - on PCA pump for pain. Switching to Dilaudid   2.Chronic systolic HF - EF 78% also has severe RV failure.  S/p HM-3 LVAD on 09/06/17 - Volume status ok   3. HM3 LVAD 09/06/2017. - VAD interrogated personally. Parameters stable. - LDH 245 - INR 4.5 -> 5.0 ->  2.6->1.7. Has gotten 2 doses vit k (2.5, 5.0) - continue to hold coumadin for OR. Resume once all surgeries/ procedures complete  - Off ASA with PUD.   4. Essential HTN - MAPs ok, 80s-90s - Continue hydralazine and sildenafil   5. AKI - baseline SCr ~1.2.  -peaked at 1.55 this admit.  Stable 1.1-1.2 now  6. OSA - sleep study with very severe OSA (AHI 69/hr) - non-compliant CPAP  7. H/o GI bleed  - 08/2018 EGD completed and showed gastritis and nonbleeding duodenal ulcers, colonoscopy with 5 polyps removed. Protonix was increased to BID.  - No recent GI bleeding - hgb stable 9-10  - He will stay off aspirin for now.   8. Tobacco Abuse  - says he quit recently  18. Severe protein calorie malnutrition  - nutrition seeing  10. Hyponatremia - Na 128.  - mentating ok. Monitor    Length of Stay: [address] PA-C  08/21/2019, 9:44 AM  Advanced Heart Failure Team Pager [phone] (M-F; 7a - 4p)  Please contact [facility] for night-coverage after hours (4p  -7a ) and weekends on [email or web address]  Patient seen and examined with the above-signed Advanced Practice Provider and/or Housestaff. I personally reviewed laboratory data, imaging studies and relevant notes. I independently examined the patient and formulated the important aspects of the plan. I have edited the note to reflect any of my changes or salient points. I have personally discussed the plan with the patient and/or family.  Underwent I&D of driveline abscess yesterday. GS c/w mild staph. Remains on IV abx, Still having pain but improved. Now AF. VAD interrogated personally. Parameters stable. INR 1.7.   General:  Weak appearing HEENT: normal  Neck: supple. JVP not elevated.  Carotids 2+ bilat; no bruits. No lymphadenopathy or thryomegaly appreciated. Cor: LVAD hum.  Lungs: Clear. Abdomen: obese soft, + distended.  Wound packed and bandaged No hepatosplenomegaly. No bruits or masses. Good bowel sounds.  Extremities: no cyanosis, clubbing, rash. Warm no edema  Neuro: alert & oriented x 3. No focal deficits. Moves all 4 without problem   He has extensive DL abscess. Now s/p I&D and remains on IV abx. Plan to return to OR tomorrow. Hold heparin for now. VAD interrogated personally. Parameters stable.  [name], MD  6:57 PM

## 2019-08-21 NOTE — Progress Notes (Addendum)
LVAD Coordinator Rounding Note:  Admitted 08/16/19 by Dr. [name] due to VAD drive line infection/pneumonia/sepsis  HM III LVAD implanted on 09/06/17 by Dr. Cyndia Bent under Destination Therapy criteria.  Pt is lying in bed, napping on and off. Pt's mother is at bedside. He states he is still having pain, but PCA Dilaudid is helping. Made aware of need to go to OR in the morning for washout and possible wound vac placement with Dr Prescott Gum.   Vital signs: Temp: 98.3 HR: 104 Doppler Pressure: 98 Automatic BP: 106/81 (89) O2 Sat: 98% RA Wt: 227.5>231>235>233.9  lbs   LVAD interrogation reveals:  Speed: 5600 Flow: 5.2 Power:  4.3w PI: 2.8 Alarms: none Events: none Hematocrit: 31  Fixed speed: 5600 Low speed limit: 5300  Drive Line: CDI. Twice weekly dressing changes using daily kit w/siliver strip by bedside nurse. Next dressing change due 08/23/19.  Sternal wound: Ioban and packing clean, dry, intact. Will be changed in OR by Dr Prescott Gum tomorrow.  Labs:  LDH trend: 227>230>271>245  INR trend: 4.5>5.0>1.7>1.7  WBC: 27.3>26.2>24>34.6>25.8  Infection: - blood cultures 08/17/19>>staphylococcus aureus - Drive line tunnel / sternum 08/20/19>> abundant WBC, gram + cocci, culture pending - Drive line AFB 8/1/191>> pending   Anticoagulation Plan: -INR Goal: 2.0 - 2.5  -ASA Dose: none  Adverse Events: - driveline cx 47/8/29>> staph aureus - Admitted for sternal/drive line infection. OR for debridement 08/20/19-     Plan/Recommendations:  1. Call VAD pager if any VAD equipment or drive line issues. 2. Twice weekly dressing changes by bedside nurse. Next dressing change due 08/23/19. 3. VAD coordinator will accompany patient to OR tomorrow morning.   Emerson Monte RN [place] Coordinator  Office: [phone]  24/7 Pager: [phone]

## 2019-08-21 NOTE — Progress Notes (Signed)
[facility]  Date of Admission:  08/16/2019     Total days of antibiotics 6         ASSESSMENT:  Jonathan Wood is POD #1 from I&D of abscess associated with LVAD. Surgical specimens with gram stain showing gram positive cocci likely to be consistent with previous MSSA. Hemodynamically stable at present with plans to return to the OR tomorrow. Blood cultures from 4/3 remain without growth. Continue current dose of Cefazolin. He will require prolonged course of IV therapy followed by oral suppression indefinitely.  PLAN:  1. Continue cefazolin.  2. Monitor cultures for clearance of bacteremia and surgical cultures for identification.  3. Wound care per CVTS with plan to return to the OR on 4/7.   Principal Problem:   Infection associated with driveline of left ventricular assist device (LVAD) (HCC) Active Problems:   Elevated LFTs   LVAD (left ventricular assist device) present (HCC)   Normocytic anemia   Chronic combined systolic (congestive) and diastolic (congestive) heart failure (HCC)   OSA on CPAP   Sepsis (HCC)   Cigarette smoker   COPD (chronic obstructive pulmonary disease) (HCC)   Hypertension   Bacteremia due to methicillin susceptible Staphylococcus aureus (MSSA)   . acetaminophen  1,000 mg Oral Q6H   Or  . acetaminophen (TYLENOL) oral liquid 160 mg/5 mL  1,000 mg Oral Q6H  . bisacodyl  10 mg Oral Daily  . Chlorhexidine Gluconate Cloth  6 each Topical Daily  . docusate sodium  100 mg Oral TID  . DULoxetine  30 mg Oral Daily  . feeding supplement (ENSURE ENLIVE)  237 mL Oral TID BM  . ferrous sulfate  325 mg Oral Q breakfast  . gabapentin  300 mg Oral TID  . hydrALAZINE  50 mg Oral TID  . HYDROmorphone   Intravenous Q4H  . linaclotide  145 mcg Oral QODAY  . mouth rinse  15 mL Mouth Rinse BID  . metoCLOPramide (REGLAN) injection  10 mg Intravenous Q6H  . multivitamin with minerals  1 tablet Oral Daily  . pantoprazole  40 mg Oral BID   . rifampin  600 mg Oral Daily  . senna-docusate  1 tablet Oral QHS  . sildenafil  20 mg Oral TID  . sodium chloride flush  10-40 mL Intracatheter Q12H  . sodium chloride flush  3 mL Intravenous Q12H    SUBJECTIVE:  Afebrile overnight with no acute events. WBC count down to 25.8. POD #1 from I&D of abscess in substernal abdominal wall. Abscess with purulent drainage was sent for culture. Plans to return to the OR on 4/7.   No Known Allergies   Review of Systems: Review of Systems  Constitutional: Negative for chills, fever and weight loss.  Respiratory: Negative for cough, shortness of breath and wheezing.   Cardiovascular: Negative for chest pain and leg swelling.  Gastrointestinal: Negative for abdominal pain, constipation, diarrhea, nausea and vomiting.  Skin: Negative for rash.      OBJECTIVE: Vitals:   08/21/19 0600 08/21/19 0630 08/21/19 0721 08/21/19 0811  BP: 98/82 104/80    Pulse: 96 96    Resp: (!) 22 19 19    Temp:    97.7 F (36.5 C)  TempSrc:    Oral  SpO2: 99% 98% 100%   Weight:      Height:       Body mass index is 30.03 kg/m.  Physical Exam Constitutional:      General: He is not  in acute distress.    Appearance: He is well-developed.  Cardiovascular:     Rate and Rhythm: Normal rate and regular rhythm.     Comments: LVAD hum Pulmonary:     Effort: Pulmonary effort is normal.     Breath sounds: Normal breath sounds.  Skin:    General: Skin is warm and dry.  Neurological:     Mental Status: He is alert and oriented to person, place, and time.  Psychiatric:        Behavior: Behavior normal.        Thought Content: Thought content normal.        Judgment: Judgment normal.     Lab Results Lab Results  Component Value Date   WBC 25.8 (H) 08/21/2019   HGB 9.3 (L) 08/21/2019   HCT 30.5 (L) 08/21/2019   MCV 93.3 08/21/2019   PLT 307 08/21/2019    Lab Results  Component Value Date   CREATININE 1.17 08/21/2019   BUN 34 (H) 08/21/2019    NA 128 (L) 08/21/2019   K 4.8 08/21/2019   CL 92 (L) 08/21/2019   CO2 25 08/21/2019    Lab Results  Component Value Date   ALT 13 08/20/2019   AST 28 08/20/2019   ALKPHOS 198 (H) 08/20/2019   BILITOT 2.6 (H) 08/20/2019     Microbiology: Recent Results (from the past 240 hour(s))  Culture, blood (routine x 2)     Status: Abnormal   Collection Time: 08/16/19  3:39 PM   Specimen: BLOOD  Result Value Ref Range Status   Specimen Description BLOOD LEFT ANTECUBITAL  Final   Special Requests   Final    BOTTLES DRAWN AEROBIC ONLY Blood Culture adequate volume   Culture  Setup Time   Final    GRAM POSITIVE COCCI IN CLUSTERS AEROBIC BOTTLE ONLY CRITICAL RESULT CALLED TO, READ BACK BY AND VERIFIED WITH: B MANCHERIL PHARMD 2041 08/17/19 A BROWNING    Culture (A)  Final    STAPHYLOCOCCUS AUREUS SUSCEPTIBILITIES PERFORMED ON PREVIOUS CULTURE WITHIN THE LAST 5 DAYS. Performed at [facility], [address] [place], [place] [address]    Report Status 08/19/2019 FINAL  Final  Culture, blood (routine x 2)     Status: Abnormal   Collection Time: 08/16/19  3:41 PM   Specimen: BLOOD  Result Value Ref Range Status   Specimen Description BLOOD RIGHT ANTECUBITAL  Final   Special Requests   Final    BOTTLES DRAWN AEROBIC AND ANAEROBIC Blood Culture adequate volume   Culture  Setup Time   Final    GRAM POSITIVE COCCI IN CLUSTERS IN BOTH AEROBIC AND ANAEROBIC BOTTLES Organism ID to follow CRITICAL RESULT CALLED TO, READ BACK BY AND VERIFIED WITHColin Rhein [facility] 2041 08/17/19 A BROWNING Performed at [facility], [address] [place], [place] [address]    Culture STAPHYLOCOCCUS AUREUS (A)  Final   Report Status 08/19/2019 FINAL  Final   Organism ID, Bacteria STAPHYLOCOCCUS AUREUS  Final      Susceptibility   Staphylococcus aureus - MIC*    CIPROFLOXACIN <=0.5 SENSITIVE Sensitive     ERYTHROMYCIN <=0.25 SENSITIVE Sensitive     GENTAMICIN <=0.5 SENSITIVE Sensitive      OXACILLIN 0.5 SENSITIVE Sensitive     TETRACYCLINE <=1 SENSITIVE Sensitive     VANCOMYCIN 1 SENSITIVE Sensitive     TRIMETH/SULFA <=10 SENSITIVE Sensitive     CLINDAMYCIN <=0.25 SENSITIVE Sensitive     RIFAMPIN <=0.5  SENSITIVE Sensitive     Inducible Clindamycin NEGATIVE Sensitive     * STAPHYLOCOCCUS AUREUS  Blood Culture ID Panel (Reflexed)     Status: Abnormal   Collection Time: 08/16/19  3:41 PM  Result Value Ref Range Status   Enterococcus species NOT DETECTED NOT DETECTED Final   Listeria monocytogenes NOT DETECTED NOT DETECTED Final   Staphylococcus species DETECTED (A) NOT DETECTED Final    Comment: CRITICAL RESULT CALLED TO, READ BACK BY AND VERIFIED WITH: B MANCHERIL PHARMD 2041 08/17/19 A BROWNING    Staphylococcus aureus (BCID) DETECTED (A) NOT DETECTED Final    Comment: Methicillin (oxacillin) susceptible Staphylococcus aureus (MSSA). Preferred therapy is anti staphylococcal beta lactam antibiotic (Cefazolin or Nafcillin), unless clinically contraindicated. CRITICAL RESULT CALLED TO, READ BACK BY AND VERIFIED WITH: B MANCHERIL PHARMD 2041 08/17/19 A BROWNING    Methicillin resistance NOT DETECTED NOT DETECTED Final   Streptococcus species NOT DETECTED NOT DETECTED Final   Streptococcus agalactiae NOT DETECTED NOT DETECTED Final   Streptococcus pneumoniae NOT DETECTED NOT DETECTED Final   Streptococcus pyogenes NOT DETECTED NOT DETECTED Final   Acinetobacter baumannii NOT DETECTED NOT DETECTED Final   Enterobacteriaceae species NOT DETECTED NOT DETECTED Final   Enterobacter cloacae complex NOT DETECTED NOT DETECTED Final   Escherichia coli NOT DETECTED NOT DETECTED Final   Klebsiella oxytoca NOT DETECTED NOT DETECTED Final   Klebsiella pneumoniae NOT DETECTED NOT DETECTED Final   Proteus species NOT DETECTED NOT DETECTED Final   Serratia marcescens NOT DETECTED NOT DETECTED Final   Haemophilus influenzae NOT DETECTED NOT DETECTED Final   Neisseria meningitidis NOT DETECTED  NOT DETECTED Final   Pseudomonas aeruginosa NOT DETECTED NOT DETECTED Final   Candida albicans NOT DETECTED NOT DETECTED Final   Candida glabrata NOT DETECTED NOT DETECTED Final   Candida krusei NOT DETECTED NOT DETECTED Final   Candida parapsilosis NOT DETECTED NOT DETECTED Final   Candida tropicalis NOT DETECTED NOT DETECTED Final    Comment: Performed at [facility], [place]. [address] [place], [place] [address]  SARS CORONAVIRUS 2 (TAT 6-24 HRS) Nasopharyngeal Nasopharyngeal Swab     Status: None   Collection Time: 08/16/19  3:54 PM   Specimen: Nasopharyngeal Swab  Result Value Ref Range Status   SARS Coronavirus 2 NEGATIVE NEGATIVE Final    Comment: (NOTE) SARS-CoV-2 target nucleic acids are NOT DETECTED. The SARS-CoV-2 RNA is generally detectable in upper and lower respiratory specimens during the acute phase of infection. Negative results do not preclude SARS-CoV-2 infection, do not rule out co-infections with other pathogens, and should not be used as the sole basis for treatment or other patient management decisions. Negative results must be combined with clinical observations, patient history, and epidemiological information. The expected result is Negative. Fact Sheet for Patients: SugarRoll.be Fact Sheet for Healthcare Providers: https://www.woods-mathews.com/ This test is not yet approved or cleared by the [place] FDA and  has been authorized for detection and/or diagnosis of SARS-CoV-2 by FDA under an Emergency Use Authorization (EUA). This EUA will remain  in effect (meaning this test can be used) for the duration of the COVID-19 declaration under Section 56 4(b)(1) of the Act, 21 U.S.C. section 360bbb-3(b)(1), unless the authorization is terminated or revoked sooner. Performed at [facility], [place] [address], [place], [place] [address]   MRSA PCR Screening     Status: None   Collection Time: 08/16/19  6:41  PM   Specimen: Nasopharyngeal  Result Value Ref Range Status  MRSA by PCR NEGATIVE NEGATIVE Final    Comment:        The GeneXpert MRSA Assay (FDA approved for NASAL specimens only), is one component of a comprehensive MRSA colonization surveillance program. It is not intended to diagnose MRSA infection nor to guide or monitor treatment for MRSA infections. Performed at [facility], [address], [place], [place] [address]   Culture, blood (routine x 2)     Status: None (Preliminary result)   Collection Time: 08/18/19  2:55 PM   Specimen: BLOOD  Result Value Ref Range Status   Specimen Description BLOOD LEFT ANTECUBITAL  Final   Special Requests   Final    BOTTLES DRAWN AEROBIC AND ANAEROBIC Blood Culture adequate volume   Culture   Final    NO GROWTH 3 DAYS Performed at [facility], [address] [place], [place] [address]    Report Status PENDING  Incomplete  Culture, blood (routine x 2)     Status: None (Preliminary result)   Collection Time: 08/18/19  3:01 PM   Specimen: BLOOD  Result Value Ref Range Status   Specimen Description BLOOD RIGHT ANTECUBITAL  Final   Special Requests   Final    BOTTLES DRAWN AEROBIC AND ANAEROBIC Blood Culture adequate volume   Culture   Final    NO GROWTH 3 DAYS Performed at [facility], [address], [place], [place] [address]    Report Status PENDING  Incomplete  Aerobic/Anaerobic Culture (surgical/deep wound)     Status: None (Preliminary result)   Collection Time: 08/20/19  3:18 PM   Specimen: Wound  Result Value Ref Range Status   Specimen Description WOUND VAD DRIVELINE TUNNEL INFECTION  Final   Special Requests SENT ON SWAB  Final   Gram Stain   Final    ABUNDANT WBC PRESENT,BOTH PMN AND MONONUCLEAR FEW GRAM POSITIVE COCCI    Culture   Final    CULTURE REINCUBATED FOR BETTER GROWTH Performed at [facility], [address] [place], [place] [address]    Report Status PENDING  Incomplete      Marcos Eke, NP [facility] [phone] Pager  08/21/2019  10:05 AM

## 2019-08-21 NOTE — Anesthesia Preprocedure Evaluation (Addendum)
Anesthesia Evaluation  Patient identified by MRN, date of birth, ID band Patient awake    Reviewed: Allergy & Precautions, H&P , NPO status , Patient's Chart, lab work & pertinent test results  Airway Mallampati: II  TM Distance: >3 FB Neck ROM: Full    Dental no notable dental hx. (+) Teeth Intact, Dental Advisory Given   Pulmonary asthma , sleep apnea , COPD,  COPD inhaler, Current Smoker and Patient abstained from smoking.,    Pulmonary exam normal breath sounds clear to auscultation       Cardiovascular Exercise Tolerance: Good hypertension, +CHF   Rhythm:Regular Rate:Normal     Neuro/Psych Anxiety negative neurological ROS     GI/Hepatic Neg liver ROS, hiatal hernia, GERD  ,  Endo/Other  negative endocrine ROS  Renal/GU negative Renal ROS  negative genitourinary   Musculoskeletal   Abdominal   Peds  Hematology  (+) Blood dyscrasia, anemia ,   Anesthesia Other Findings   Reproductive/Obstetrics negative OB ROS                            Anesthesia Physical Anesthesia Plan  ASA: IV  Anesthesia Plan: General   Post-op Pain Management:    Induction: Intravenous  PONV Risk Score and Plan: 2 and Ondansetron, Dexamethasone and Midazolam  Airway Management Planned: Oral ETT  Additional Equipment:   Intra-op Plan:   Post-operative Plan: Extubation in OR  Informed Consent: I have reviewed the patients History and Physical, chart, labs and discussed the procedure including the risks, benefits and alternatives for the proposed anesthesia with the patient or authorized representative who has indicated his/her understanding and acceptance.     Dental advisory given  Plan Discussed with: CRNA  Anesthesia Plan Comments:         Anesthesia Quick Evaluation

## 2019-08-21 NOTE — Progress Notes (Signed)
1 Day Post-Op Procedure(s) (LRB): DEBRIDEMENT OF LVAD DRIVELINE TUNNEL (N/A) Abdominal pack is secure Plan return to OR in am Patient may drink, eat today NO COUMADIN or Heparin Dilaudid PCA for pain Objective: Vital signs in last 24 hours: Temp:  [96.9 F (36.1 C)-98.1 F (36.7 C)] 97.7 F (36.5 C) (04/06 0811) Pulse Rate:  [71-153] 96 (04/06 0630) Cardiac Rhythm: Normal sinus rhythm;Sinus tachycardia (04/06 0500) Resp:  [14-44] 19 (04/06 0721) BP: (69-117)/(50-98) 104/80 (04/06 0630) SpO2:  [89 %-100 %] 100 % (04/06 0721) Weight:  [106.1 kg] 106.1 kg (04/06 0500)  Hemodynamic parameters for last 24 hours:    Intake/Output from previous day: 04/05 0701 - 04/06 0700 In: 3147.1 [P.O.:1800; I.V.:1047; IV Piggyback:300.1] Out: 250 [Urine:250] Intake/Output this shift: Total I/O In: -  Out: 300 [Urine:300]  Normal VAD hum extrem warm  Lab Results: Recent Labs    08/20/19 0205 08/21/19 0507  WBC 34.6* 25.8*  HGB 9.9* 9.3*  HCT 31.2* 30.5*  PLT 312 307   BMET:  Recent Labs    08/20/19 1215 08/21/19 0507  NA 126* 128*  K 5.4* 4.8  CL 94* 92*  CO2 22 25  GLUCOSE 146* 124*  BUN 33* 34*  CREATININE 1.24 1.17  CALCIUM 8.9 8.6*    PT/INR:  Recent Labs    08/21/19 0507  LABPROT 20.2*  INR 1.7*   ABG    Component Value Date/Time   PHART 7.405 09/08/2017 0049   HCO3 23.4 09/08/2017 0049   TCO2 32 09/12/2017 1605   ACIDBASEDEF 1.0 09/08/2017 0049   O2SAT 73.1 08/21/2019 0507   CBG (last 3)  No results for input(s): GLUCAP in the last 72 hours.  Assessment/Plan: S/P Procedure(s) (LRB): DEBRIDEMENT OF LVAD DRIVELINE TUNNEL (N/A) Return to OR tomorrow to debride and pack- prob VAC with Acell Fri am Hold anticoagulation   LOS: 5 days    Jonathan Wood 08/21/2019

## 2019-08-21 NOTE — Progress Notes (Signed)
ANTICOAGULATION CONSULT NOTE - Follow Up Consult  Pharmacy Consult for warfarin Indication: LVAD  No Known Allergies  Patient Measurements: Height: 6\' 2"  (188 cm) Weight: 106.1 kg (233 lb 14.5 oz) IBW/kg (Calculated) : 82.2  Vital Signs: Temp: 97.7 F (36.5 C) (04/06 0811) Temp Source: Oral (04/06 0811) BP: 108/90 (04/06 1014) Pulse Rate: 101 (04/06 1000)  Labs: Recent Labs    08/19/19 0251 08/19/19 0251 08/20/19 0205 08/20/19 1215 08/21/19 0507  HGB 10.4*   < > 9.9*  --  9.3*  HCT 33.7*  --  31.2*  --  30.5*  PLT 297  --  312  --  307  LABPROT 27.8*  --  19.5*  --  20.2*  INR 2.6*  --  1.7*  --  1.7*  CREATININE 1.22   < > 1.22 1.24 1.17   < > = values in this interval not displayed.    Estimated Creatinine Clearance: 102.4 mL/min (by C-G formula based on SCr of 1.17 mg/dL).   Medical History: Past Medical History:  Diagnosis Date  . Asthma   . CHF (congestive heart failure) (HCC)    a. 09/2016: EF 20-25% with cath showing normal cors  . GERD (gastroesophageal reflux disease)   . History of hiatal hernia   . OSA on CPAP 09/06/2018   Severe OSA with AHI 68/hr on CPAP at 12cm H2O      Assessment: 48yom with HF s/p LVAD implant HM3 place 4/19 admitted with drive line infection.   Warfarin PTA 7.5mg  MW and 5mg  all other days admit INR elevated at 5.   S/p Vit K x2 doses in preparation for OR 4/5.  H/H stable, no bleeding noted, LDH 250s Started on rifampin - urine orange/red tinged S/p Debridement 4/5 WBC improving, planned ongoing debridement - hold warfarin and heparin for now  Goal of Therapy:  INR 2-2.5 Monitor platelets by anticoagulation protocol: Yes   Plan:  Holding warfarin and heparin for now Follow up plan post wound debridement  Daily INR  5/19 Pharm.D. CPP, BCPS Clinical Pharmacist [phone] 08/21/2019 11:44 AM

## 2019-08-22 ENCOUNTER — Inpatient Hospital Stay (HOSPITAL_COMMUNITY): Payer: Medicare Other | Admitting: Anesthesiology

## 2019-08-22 ENCOUNTER — Inpatient Hospital Stay: Payer: Self-pay

## 2019-08-22 ENCOUNTER — Inpatient Hospital Stay (HOSPITAL_COMMUNITY): Payer: Medicare Other

## 2019-08-22 ENCOUNTER — Encounter (HOSPITAL_COMMUNITY)
Admission: EM | Disposition: A | Discharge status: Home Health | Payer: Self-pay | Source: Ambulatory Visit | Attending: Internal Medicine

## 2019-08-22 DIAGNOSIS — I9789 Other postprocedural complications and disorders of the circulatory system, not elsewhere classified: Secondary | ICD-10-CM

## 2019-08-22 DIAGNOSIS — I5023 Acute on chronic systolic (congestive) heart failure: Secondary | ICD-10-CM | POA: Diagnosis not present

## 2019-08-22 DIAGNOSIS — Z20822 Contact with and (suspected) exposure to covid-19: Secondary | ICD-10-CM | POA: Diagnosis not present

## 2019-08-22 DIAGNOSIS — T827XXA Infection and inflammatory reaction due to other cardiac and vascular devices, implants and grafts, initial encounter: Secondary | ICD-10-CM | POA: Diagnosis not present

## 2019-08-22 DIAGNOSIS — A4101 Sepsis due to Methicillin susceptible Staphylococcus aureus: Secondary | ICD-10-CM | POA: Diagnosis not present

## 2019-08-22 HISTORY — PX: APPLICATION OF WOUND VAC: SHX5189

## 2019-08-22 LAB — COMPREHENSIVE METABOLIC PANEL
ALT: 17 U/L (ref 0–44)
AST: 53 U/L — ABNORMAL HIGH (ref 15–41)
Albumin: 2 g/dL — ABNORMAL LOW (ref 3.5–5.0)
Alkaline Phosphatase: 138 U/L — ABNORMAL HIGH (ref 38–126)
Anion gap: 9 (ref 5–15)
BUN: 25 mg/dL — ABNORMAL HIGH (ref 6–20)
CO2: 25 mmol/L (ref 22–32)
Calcium: 8.5 mg/dL — ABNORMAL LOW (ref 8.9–10.3)
Chloride: 91 mmol/L — ABNORMAL LOW (ref 98–111)
Creatinine, Ser: 1.04 mg/dL (ref 0.61–1.24)
GFR calc Af Amer: >60 mL/min (ref >60)
GFR calc non Af Amer: >60 mL/min (ref >60)
Glucose, Bld: 129 mg/dL — ABNORMAL HIGH (ref 70–99)
Potassium: 4.9 mmol/L (ref 3.5–5.1)
Sodium: 125 mmol/L — ABNORMAL LOW (ref 135–145)
Total Bilirubin: 1.5 mg/dL — ABNORMAL HIGH (ref 0.3–1.2)
Total Protein: 7.7 g/dL (ref 6.5–8.1)

## 2019-08-22 LAB — CBC
HCT: 31 % — ABNORMAL LOW (ref 39.0–52.0)
Hemoglobin: 9.3 g/dL — ABNORMAL LOW (ref 13.0–17.0)
MCH: 28.2 pg (ref 26.0–34.0)
MCHC: 30 g/dL (ref 30.0–36.0)
MCV: 93.9 fL (ref 80.0–100.0)
Platelets: 305 10*3/uL (ref 150–400)
RBC: 3.3 MIL/uL — ABNORMAL LOW (ref 4.22–5.81)
RDW: 17.4 % — ABNORMAL HIGH (ref 11.5–15.5)
WBC: 20.8 10*3/uL — ABNORMAL HIGH (ref 4.0–10.5)
nRBC: 1.8 % — ABNORMAL HIGH (ref 0.0–0.2)

## 2019-08-22 LAB — COOXEMETRY PANEL
Carboxyhemoglobin: 2.1 % — ABNORMAL HIGH (ref 0.5–1.5)
Methemoglobin: 0.9 % (ref 0.0–1.5)
O2 Saturation: 69.2 %
Total hemoglobin: 9.8 g/dL — ABNORMAL LOW (ref 12.0–16.0)

## 2019-08-22 LAB — PROTIME-INR
INR: 1.7 — ABNORMAL HIGH (ref 0.8–1.2)
Prothrombin Time: 19.6 seconds — ABNORMAL HIGH (ref 11.4–15.2)

## 2019-08-22 LAB — LACTATE DEHYDROGENASE: LDH: 233 U/L — ABNORMAL HIGH (ref 98–192)

## 2019-08-22 SURGERY — WOUND EXPLORATION
Anesthesia: General

## 2019-08-22 SURGERY — APPLICATION, WOUND VAC
Anesthesia: General | Site: Abdomen

## 2019-08-22 MED ORDER — FENTANYL CITRATE (PF) 250 MCG/5ML IJ SOLN
INTRAMUSCULAR | Status: DC | PRN
  Administered 2019-08-22 (×3): 50 ug via INTRAVENOUS

## 2019-08-22 MED ORDER — SUCCINYLCHOLINE CHLORIDE 200 MG/10ML IV SOSY
PREFILLED_SYRINGE | INTRAVENOUS | Status: DC | PRN
  Administered 2019-08-22: 100 mg via INTRAVENOUS

## 2019-08-22 MED ORDER — ONDANSETRON HCL 4 MG/2ML IJ SOLN
INTRAMUSCULAR | Status: DC | PRN
  Administered 2019-08-22: 4 mg via INTRAVENOUS

## 2019-08-22 MED ORDER — ACETAMINOPHEN 10 MG/ML IV SOLN: 1000.0000 mg | Freq: Once | INTRAVENOUS | Status: DC

## 2019-08-22 MED ORDER — FENTANYL CITRATE (PF) 250 MCG/5ML IJ SOLN
INTRAMUSCULAR | Status: AC
  Filled 2019-08-22: qty 5

## 2019-08-22 MED ORDER — DEXMEDETOMIDINE HCL 200 MCG/2ML IV SOLN
INTRAVENOUS | Status: DC | PRN
  Administered 2019-08-22 (×4): 8 ug via INTRAVENOUS

## 2019-08-22 MED ORDER — HEMOSTATIC AGENTS (NO CHARGE) OPTIME
TOPICAL | Status: DC | PRN
  Administered 2019-08-22: 1 via TOPICAL

## 2019-08-22 MED ORDER — PHENYLEPHRINE 40 MCG/ML (10ML) SYRINGE FOR IV PUSH (FOR BLOOD PRESSURE SUPPORT)
PREFILLED_SYRINGE | INTRAVENOUS | Status: DC | PRN
  Administered 2019-08-22: 80 ug via INTRAVENOUS

## 2019-08-22 MED ORDER — VANCOMYCIN HCL 1000 MG IV SOLR
INTRAVENOUS | Status: DC | PRN
  Administered 2019-08-22 (×2): 1000 mg

## 2019-08-22 MED ORDER — ONDANSETRON HCL 4 MG/2ML IJ SOLN
INTRAMUSCULAR | Status: AC
  Filled 2019-08-22: qty 2

## 2019-08-22 MED ORDER — POVIDONE-IODINE 10 % EX SOLN
CUTANEOUS | Status: DC | PRN
  Administered 2019-08-22: 1 via TOPICAL

## 2019-08-22 MED ORDER — SODIUM CHLORIDE 0.9 % IR SOLN
Status: DC | PRN
  Administered 2019-08-22 (×3): 1000 mL
  Administered 2019-08-22: 1

## 2019-08-22 MED ORDER — LACTATED RINGERS IV SOLN: INTRAVENOUS | Status: DC | PRN

## 2019-08-22 MED ORDER — WARFARIN - PHYSICIAN DOSING INPATIENT: Freq: Every day | Status: DC

## 2019-08-22 MED ORDER — DEXAMETHASONE SODIUM PHOSPHATE 10 MG/ML IJ SOLN
INTRAMUSCULAR | Status: DC | PRN
  Administered 2019-08-22: 5 mg via INTRAVENOUS

## 2019-08-22 MED ORDER — VANCOMYCIN HCL 1000 MG IV SOLR
INTRAVENOUS | Status: AC
  Filled 2019-08-22: qty 1000

## 2019-08-22 MED ORDER — PROPOFOL 10 MG/ML IV BOLUS
INTRAVENOUS | Status: DC | PRN
  Administered 2019-08-22: 50 mg via INTRAVENOUS

## 2019-08-22 MED ORDER — ROCURONIUM BROMIDE 10 MG/ML (PF) SYRINGE
PREFILLED_SYRINGE | INTRAVENOUS | Status: DC | PRN
  Administered 2019-08-22: 40 mg via INTRAVENOUS

## 2019-08-22 MED ORDER — MIDAZOLAM HCL 5 MG/5ML IJ SOLN
INTRAMUSCULAR | Status: DC | PRN
  Administered 2019-08-22: 2 mg via INTRAVENOUS

## 2019-08-22 MED ORDER — MIDAZOLAM HCL 2 MG/2ML IJ SOLN
INTRAMUSCULAR | Status: AC
  Filled 2019-08-22: qty 2

## 2019-08-22 MED ORDER — ALBUMIN HUMAN 5 % IV SOLN: INTRAVENOUS | Status: DC | PRN

## 2019-08-22 MED ORDER — ACETAMINOPHEN 10 MG/ML IV SOLN
INTRAVENOUS | Status: AC
  Administered 2019-08-22: 11:00:00 1000 mg
  Filled 2019-08-22: qty 100

## 2019-08-22 MED ORDER — ROCURONIUM BROMIDE 10 MG/ML (PF) SYRINGE
PREFILLED_SYRINGE | INTRAVENOUS | Status: AC
  Filled 2019-08-22: qty 10

## 2019-08-22 MED ORDER — PHENYLEPHRINE 40 MCG/ML (10ML) SYRINGE FOR IV PUSH (FOR BLOOD PRESSURE SUPPORT)
PREFILLED_SYRINGE | INTRAVENOUS | Status: AC
  Filled 2019-08-22: qty 10

## 2019-08-22 MED ORDER — SUCCINYLCHOLINE CHLORIDE 200 MG/10ML IV SOSY
PREFILLED_SYRINGE | INTRAVENOUS | Status: AC
  Filled 2019-08-22: qty 10

## 2019-08-22 MED ORDER — DEXAMETHASONE SODIUM PHOSPHATE 10 MG/ML IJ SOLN
INTRAMUSCULAR | Status: AC
  Filled 2019-08-22: qty 1

## 2019-08-22 MED ORDER — PHENYLEPHRINE HCL-NACL 10-0.9 MG/250ML-% IV SOLN
INTRAVENOUS | Status: DC | PRN
  Administered 2019-08-22: 35 ug/min via INTRAVENOUS

## 2019-08-22 MED ORDER — PROPOFOL 10 MG/ML IV BOLUS
INTRAVENOUS | Status: AC
  Filled 2019-08-22: qty 20

## 2019-08-22 MED ORDER — WARFARIN SODIUM 2.5 MG PO TABS
2.5000 mg | ORAL_TABLET | Freq: Every day | ORAL | Status: DC
  Administered 2019-08-22: 17:00:00 2.5 mg via ORAL
  Filled 2019-08-22: qty 1

## 2019-08-22 MED ORDER — HYDROMORPHONE HCL 1 MG/ML IJ SOLN: 0.2500 mg | INTRAMUSCULAR | Status: DC | PRN

## 2019-08-22 MED ORDER — SUGAMMADEX SODIUM 200 MG/2ML IV SOLN
INTRAVENOUS | Status: DC | PRN
  Administered 2019-08-22 (×3): 100 mg via INTRAVENOUS

## 2019-08-22 SURGICAL SUPPLY — 59 items
APL SKNCLS STERI-STRIP NONHPOA (GAUZE/BANDAGES/DRESSINGS)
BENZOIN TINCTURE PRP APPL 2/3 (GAUZE/BANDAGES/DRESSINGS) IMPLANT
BLADE CLIPPER SURG (BLADE) ×1 IMPLANT
BLADE SURG 10 STRL SS (BLADE) IMPLANT
BLADE SURG 15 STRL LF DISP TIS (BLADE) IMPLANT
BLADE SURG 15 STRL SS (BLADE)
BNDG GAUZE ELAST 4 BULKY (GAUZE/BANDAGES/DRESSINGS) ×2 IMPLANT
CANISTER SUCT 3000ML PPV (MISCELLANEOUS) ×3 IMPLANT
CANISTER WOUND CARE 500ML ATS (WOUND CARE) ×3 IMPLANT
CLIP VESOCCLUDE SM WIDE 24/CT (CLIP) IMPLANT
CNTNR URN SCR LID CUP LEK RST (MISCELLANEOUS) IMPLANT
CONT SPEC 4OZ STRL OR WHT (MISCELLANEOUS)
COVER SURGICAL LIGHT HANDLE (MISCELLANEOUS) ×2 IMPLANT
DRAPE INCISE IOBAN 66X45 STRL (DRAPES) ×2 IMPLANT
DRAPE LAPAROSCOPIC ABDOMINAL (DRAPES) ×1 IMPLANT
DRAPE SLUSH MACHINE 52X66 (DRAPES) ×2 IMPLANT
DRAPE SLUSH/WARMER DISC (DRAPES) IMPLANT
DRSG PAD ABDOMINAL 8X10 ST (GAUZE/BANDAGES/DRESSINGS) ×2 IMPLANT
DRSG VAC ATS LRG SENSATRAC (GAUZE/BANDAGES/DRESSINGS) ×1 IMPLANT
DRSG VAC ATS MED SENSATRAC (GAUZE/BANDAGES/DRESSINGS) ×1 IMPLANT
DRSG VAC ATS SM SENSATRAC (GAUZE/BANDAGES/DRESSINGS) ×1 IMPLANT
ELECT BLADE 4.0 EZ CLEAN MEGAD (MISCELLANEOUS) ×3
ELECT REM PT RETURN 9FT ADLT (ELECTROSURGICAL) ×3
ELECTRODE BLDE 4.0 EZ CLN MEGD (MISCELLANEOUS) IMPLANT
ELECTRODE REM PT RTRN 9FT ADLT (ELECTROSURGICAL) ×1 IMPLANT
GAUZE SPONGE 4X4 12PLY STRL (GAUZE/BANDAGES/DRESSINGS) ×2 IMPLANT
GAUZE SPONGE 4X4 12PLY STRL LF (GAUZE/BANDAGES/DRESSINGS) ×2 IMPLANT
GAUZE XEROFORM 5X9 LF (GAUZE/BANDAGES/DRESSINGS) IMPLANT
GLOVE BIO SURGEON STRL SZ 6 (GLOVE) ×2 IMPLANT
GLOVE BIO SURGEON STRL SZ7.5 (GLOVE) ×8 IMPLANT
GLOVE BIOGEL PI IND STRL 6 (GLOVE) IMPLANT
GLOVE BIOGEL PI INDICATOR 6 (GLOVE) ×4
GOWN STRL REUS W/ TWL LRG LVL3 (GOWN DISPOSABLE) ×2 IMPLANT
GOWN STRL REUS W/TWL LRG LVL3 (GOWN DISPOSABLE) ×9
HANDPIECE INTERPULSE COAX TIP (DISPOSABLE) ×3
HEMOSTAT POWDER SURGIFOAM 1G (HEMOSTASIS) IMPLANT
HEMOSTAT SURGICEL 2X14 (HEMOSTASIS) IMPLANT
KIT BASIN OR (CUSTOM PROCEDURE TRAY) ×3 IMPLANT
KIT SUCTION CATH 14FR (SUCTIONS) IMPLANT
KIT TURNOVER KIT B (KITS) ×3 IMPLANT
NS IRRIG 1000ML POUR BTL (IV SOLUTION) ×7 IMPLANT
PACK GENERAL/GYN (CUSTOM PROCEDURE TRAY) ×3 IMPLANT
PACK UNIVERSAL I (CUSTOM PROCEDURE TRAY) ×2 IMPLANT
PAD ABD 8X10 STRL (GAUZE/BANDAGES/DRESSINGS) ×2 IMPLANT
PAD ARMBOARD 7.5X6 YLW CONV (MISCELLANEOUS) ×6 IMPLANT
SET HNDPC FAN SPRY TIP SCT (DISPOSABLE) ×1 IMPLANT
SPONGE LAP 4X18 RFD (DISPOSABLE) ×2 IMPLANT
STAPLER VISISTAT 35W (STAPLE) IMPLANT
SUT ETHILON 3 0 FSL (SUTURE) IMPLANT
SUT VIC AB 1 CTX 36 (SUTURE)
SUT VIC AB 1 CTX36XBRD ANBCTR (SUTURE) IMPLANT
SUT VIC AB 2-0 CTX 27 (SUTURE) IMPLANT
SUT VIC AB 3-0 X1 27 (SUTURE) IMPLANT
SWAB COLLECTION DEVICE MRSA (MISCELLANEOUS) IMPLANT
SWAB CULTURE ESWAB REG 1ML (MISCELLANEOUS) IMPLANT
TOWEL GREEN STERILE (TOWEL DISPOSABLE) ×3 IMPLANT
TOWEL GREEN STERILE FF (TOWEL DISPOSABLE) ×1 IMPLANT
TRAY FOLEY MTR SLVR 16FR STAT (SET/KITS/TRAYS/PACK) IMPLANT
WATER STERILE IRR 1000ML POUR (IV SOLUTION) ×3 IMPLANT

## 2019-08-22 NOTE — Brief Op Note (Addendum)
08/22/2019  10:05 AM  PATIENT:  Jonathan Wood  49 y.o. male  PRE-OPERATIVE DIAGNOSIS:  VAD WOUND -  Proximal Driveline  tunnel infection  POST-OPERATIVE DIAGNOSIS:  VAD WOUND - proximal driveline tunnel infection  PROCEDURE:  Procedure(s): Debridement, Irrigation and Packing of Abdominal Incision. (N/A)  SURGEON:  Surgeon(s) and Role:    Kerin Perna, MD - Primary  PHYSICIAN ASSISTANT:   ASSISTANTS: AMY ST  ANESTHESIA:   general  EBL:  10 ml   BLOOD ADMINISTERED:none  DRAINS: none   LOCAL MEDICATIONS USED:  NONE  SPECIMEN:  No Specimen  DISPOSITION OF SPECIMEN:  N/A  COUNTS:  YES  TOURNIQUET:  * No tourniquets in log *  DICTATION: .Dragon Dictation  PLAN OF CARE: return to ICU  PATIENT DISPOSITION:  PACU - hemodynamically stable.   Delay start of Pharmacological VTE agent (>24hrs) due to surgical blood loss or risk of bleeding: yes Hold heparin, start low dose coumadin

## 2019-08-22 NOTE — Transfer of Care (Signed)
Immediate Anesthesia Transfer of Care Note  Patient: MONTAVIOUS WIERZBA  Procedure(s) Performed: Debridement, Irrigation and Packing of Abdominal Incision. (N/A Abdomen)  Patient Location: PACU  Anesthesia Type:General  Level of Consciousness: awake, alert  and oriented  Airway & Oxygen Therapy: Patient Spontanous Breathing and Patient connected to face mask oxygen  Post-op Assessment: Report given to RN, Post -op Vital signs reviewed and unstable, Anesthesiologist notified and Dr Sampson Goon responded to bedside. Patient awake and oriented, says he is "only in a little bit of pain" yet he is tachycardic and tachypnic.   Post vital signs: Reviewed and stable  Last Vitals:  Vitals Value Taken Time  BP 117/101 08/22/19 1024  Temp    Pulse 120 08/22/19 1024  Resp 29 08/22/19 1024  SpO2 94 % 08/22/19 1024  Vitals shown include unvalidated device data.  Last Pain:  Vitals:   08/22/19 0500  TempSrc: Oral  PainSc:       Patients Stated Pain Goal: 2 (08/20/19 1645)  Complications: No apparent anesthesia complications

## 2019-08-22 NOTE — Anesthesia Procedure Notes (Signed)
Procedure Name: Intubation Date/Time: 08/22/2019 8:57 AM Performed by: Trinna Post., CRNA Pre-anesthesia Checklist: Patient identified, Emergency Drugs available, Suction available, Patient being monitored and Timeout performed Patient Re-evaluated:Patient Re-evaluated prior to induction Oxygen Delivery Method: Circle system utilized Preoxygenation: Pre-oxygenation with 100% oxygen Induction Type: IV induction Ventilation: Two handed mask ventilation required and Oral airway inserted - appropriate to patient size Laryngoscope Size: Mac and 4 Grade View: Grade II Tube type: Oral Tube size: 7.5 mm Number of attempts: 1 Airway Equipment and Method: Stylet Placement Confirmation: ETT inserted through vocal cords under direct vision,  positive ETCO2 and breath sounds checked- equal and bilateral Secured at: 22 cm Tube secured with: Tape Dental Injury: Teeth and Oropharynx as per pre-operative assessment

## 2019-08-22 NOTE — Progress Notes (Signed)
ANTICOAGULATION CONSULT NOTE - Follow Up Consult  Pharmacy Consult for warfarin Indication: LVAD  No Known Allergies  Patient Measurements: Height: 6\' 2"  (188 cm) Weight: 107.2 kg (236 lb 5.3 oz) IBW/kg (Calculated) : 82.2  Vital Signs: Temp: 97 F (36.1 C) (04/07 1115) Temp Source: Oral (04/07 0500) BP: 90/69 (04/07 1120) Pulse Rate: 106 (04/07 1125)  Labs: Recent Labs    08/20/19 0205 08/20/19 0205 08/20/19 1215 08/21/19 0507 08/22/19 0327  HGB 9.9*   < >  --  9.3* 9.3*  HCT 31.2*  --   --  30.5* 31.0*  PLT 312  --   --  307 305  LABPROT 19.5*  --   --  20.2* 19.6*  INR 1.7*  --   --  1.7* 1.7*  CREATININE 1.22   < > 1.24 1.17 1.04   < > = values in this interval not displayed.    Estimated Creatinine Clearance: 115.7 mL/min (by C-G formula based on SCr of 1.04 mg/dL).   Medical History: Past Medical History:  Diagnosis Date  . Asthma   . CHF (congestive heart failure) (HCC)    a. 09/2016: EF 20-25% with cath showing normal cors  . GERD (gastroesophageal reflux disease)   . History of hiatal hernia   . OSA on CPAP 09/06/2018   Severe OSA with AHI 68/hr on CPAP at 12cm H2O    Assessment: 48yom with HF s/p LVAD implant HM3 place 4/19 admitted with drive line infection.    Warfarin PTA 7.5mg  MW and 5mg  all other days admit INR elevated at 5.    INR today came back at 1.7. Hgb 9.3, plt 305. LDH 233. No s/sx of bleeding. Started on rifampin - urine orange/red tinged. No heparin infusion - okay to start warfarin at this time.   Goal of Therapy:  INR 2-2.5 Monitor platelets by anticoagulation protocol: Yes   Plan:  Holding heparin infusion for now Warfarin 2.5 mg tonight per MD  Follow up plan post wound debridement  Daily INR  5/19, PharmD, BCCCP Clinical Pharmacist  Phone: [phone] 08/22/2019 11:51 AM  Please check AMION for all [facility] phone numbers After 10:00 PM, call Main Pharmacy [phone]

## 2019-08-22 NOTE — Progress Notes (Addendum)
Advanced Heart Failure Rounding Note  PCP-Cardiologist: [name], MD    Patient Profile   49 y.o. 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  Subjective:    - 4/5 S/p I&D w/ excisional debridement of subxiphoid abscess. Cultures + Staph Aureus  - 4/7, Returned to OR for debridement, Irrigation and Packing of Abdominal Incision  Blood cultures positive for MSSA.  On cefazolin and rifampin. ID following.   WBC improving, down from 34>>25>>20K. AF.   Continue w/ pain but more comfortable than yesterday. VSS. Mother present at bedside.    VAD Interrogation: Speed:5600, Flow:5.1, Power:4.3, PI:3.1, no PI events  VAD interrogated personally. Parameters stable.   CT of C/A/P 08/16/19 IMPRESSION: 1. New fluid collections along the efferent tubing from the ventricular cyst device to the ascending aorta. Findings are suspicious for infection given clinical presentation and elevated white blood cell count. 2. Stable fluid surrounding the line within the left upper quadrant anterior abdominal wall, with decreased adjacent fat stranding. Infection in this region cannot be excluded. 3. Stable cardiomegaly. 4. Stable emphysema. 5. Otherwise no acute intra-abdominal or intrapelvic process.  Objective:   Weight Range: 107.2 kg Body mass index is 30.34 kg/m.   Vital Signs:   Temp:  [97 F (36.1 C)-98 F (36.7 C)] 97 F (36.1 C) (04/07 1115) Pulse Rate:  [38-120] 96 (04/07 1400) Resp:  [16-29] 19 (04/07 1400) BP: (82-117)/(55-102) 108/90 (04/07 1400) SpO2:  [94 %-100 %] 97 % (04/07 1400) Weight:  [107.2 kg] 107.2 kg (04/07 0500) Last BM Date: 08/21/19  Weight change: Filed Weights   08/20/19 0700 08/21/19 0500 08/22/19 0500  Weight: 106.6 kg 106.1 kg 107.2 kg    Intake/Output:   Intake/Output Summary  (Last 24 hours) at 08/22/2019 1433 Last data filed at 08/22/2019 1400 Gross per 24 hour  Intake 3279.22 ml  Output 1320 ml  Net 1959.22 ml      Physical Exam    General:  [name] laying in bed. No respiratory difficulty  HEENT: normal Neck: supple. No JVD. Carotids 2+ bilat; no bruits. No lymphadenopathy or thryomegaly appreciated. Cor: +LVAD HUM, sternal area/ driveline site covered w/ dry dressings and Ioban barrier dressing Lungs: clear, no wheezing  Abdomen: markedly distended, mildly tender No hepatosplenomegaly. No bruits or masses. Good bowel sounds. Drive line site: covered w/ clean/dry bandage  Extremities: no cyanosis, clubbing, rash, trace edema Neuro: alert & orientedx3, cranial nerves grossly intact. moves all 4 extremities w/o difficulty. Affect pleasant    Labs    CBC Recent Labs    08/21/19 0507 08/22/19 0327  WBC 25.8* 20.8*  HGB 9.3* 9.3*  HCT 30.5* 31.0*  MCV 93.3 93.9  PLT 307 144   Basic Metabolic Panel Recent Labs    08/21/19 0507 08/22/19 0327  NA 128* 125*  K 4.8 4.9  CL 92* 91*  CO2 25 25  GLUCOSE 124* 129*  BUN 34* 25*  CREATININE 1.17 1.04  CALCIUM 8.6* 8.5*   Liver Function Tests Recent Labs    08/20/19 1215 08/22/19 0327  AST 28 53*  ALT 13 17  ALKPHOS 198* 138*  BILITOT 2.6* 1.5*  PROT 8.5* 7.7  ALBUMIN 2.3* 2.0*   No results for input(s): LIPASE, AMYLASE in the last 72 hours. Cardiac Enzymes No results for input(s): CKTOTAL, CKMB, CKMBINDEX, TROPONINI in the last 72 hours.  BNP: BNP (last 3 results) Recent Labs    03/21/19 1415 04/23/19 1350  BNP 426.9* 709.6*    ProBNP (last 3 results) No results for input(s): PROBNP in the last 8760 hours.   D-Dimer No results for input(s): DDIMER in the last 72 hours. Hemoglobin A1C No results for input(s): HGBA1C in the last 72 hours. Fasting Lipid Panel No results for input(s): CHOL, HDL, LDLCALC, TRIG, CHOLHDL, LDLDIRECT in the last 72 hours. Thyroid Function Tests No  results for input(s): TSH, T4TOTAL, T3FREE, THYROIDAB in the last 72 hours.  Invalid input(s): FREET3  Other results:   Imaging    DG Chest Port 1 View  Result Date: 08/22/2019 CLINICAL DATA:  Sore chest after LVAD EXAM: PORTABLE CHEST 1 VIEW COMPARISON:  Yesterday FINDINGS: Right IJ central line in good position. LVAD that is partially covered. Stable cardiomegaly and vascular congestion with small left effusion. No evidence of air leak IMPRESSION: Stable vascular congestion and small left effusion. Electronically Signed   By: Marnee Spring M.D.   On: 08/22/2019 08:58   [place] EKG SITE RITE  Result Date: 08/22/2019 If Site Rite image not attached, placement could not be confirmed due to current cardiac rhythm.    Medications:     Scheduled Medications: . acetaminophen  1,000 mg Oral Q6H   Or  . acetaminophen (TYLENOL) oral liquid 160 mg/5 mL  1,000 mg Oral Q6H  . bisacodyl  10 mg Oral Daily  . Chlorhexidine Gluconate Cloth  6 each Topical Daily  . docusate sodium  100 mg Oral TID  . DULoxetine  30 mg Oral Daily  . feeding supplement (ENSURE ENLIVE)  237 mL Oral TID BM  . ferrous sulfate  325 mg Oral Q breakfast  . gabapentin  300 mg Oral TID  . hydrALAZINE  50 mg Oral TID  . HYDROmorphone   Intravenous Q4H  . linaclotide  145 mcg Oral QODAY  . mouth rinse  15 mL Mouth Rinse BID  . metoCLOPramide (REGLAN) injection  10 mg Intravenous Q6H  . multivitamin with minerals  1 tablet Oral Daily  . pantoprazole  40 mg Oral BID  . rifampin  600 mg Oral Daily  . senna-docusate  1 tablet Oral QHS  . sildenafil  20 mg Oral TID  . sodium chloride flush  10-40 mL Intracatheter Q12H  . sodium chloride flush  3 mL Intravenous Q12H  . warfarin  2.5 mg Oral q1600  . Warfarin - Physician Dosing Inpatient   Does not apply q1600    Infusions: . sodium chloride 10 mL/hr at 08/22/19 1300  .  ceFAZolin (ANCEF) IV 2 g (08/22/19 1427)  . dextrose 5 % and 0.45% NaCl Stopped (08/22/19 0710)  .  phenylephrine (NEO-SYNEPHRINE) Adult infusion      PRN Medications: sodium chloride, acetaminophen, albuterol, bisacodyl **OR** bisacodyl, diphenhydrAMINE **OR** diphenhydrAMINE, hydrOXYzine, magnesium hydroxide, naloxone **AND** sodium chloride flush, ondansetron (ZOFRAN) IV, ondansetron (ZOFRAN) IV, oxyCODONE, sodium chloride flush, sodium chloride flush, traMADol    Assessment/Plan    1. Fever & leukocytosis -> MSSA bacteremia/ Subxiphoid Abscess  - bcx 2/2 MSSA - ID has changed antibiotics to cefazolin and rifampin. - WBC 24 -> 29->34K-> 25->20K  - He is not a candidate for VAD exchange due to severe RV failure and noncompliance.  - S/p I&D on 4/5. Wound cultures + for Staph Aureus  - Back to OR 4/7 fror debridement and irrigation of driveline + packing  - ID recommends PICC placement and 6 weeks of  IV cefazolin followed by long-term suppressive oral cephalexin. - on PCA pump for pain. Switching to Dilaudid   2.Chronic systolic HF - EF 94% also has severe RV failure.  S/p HM-3 LVAD on 09/06/17 - Volume status ok   3. HM3 LVAD 09/06/2017. - VAD interrogated personally. Parameters stable. - LDH 233 - INR 4.5 -> 5.0 -> 2.6->1.7. Has gotten 2 doses vit k (2.5, 5.0) - per Dr. Donata Clay, ok to resume low dose coumadin, 2.5 mg, tonight w/o heparin - Off ASA with PUD.   4. Essential HTN - MAPs ok, 80s - Continue hydralazine and sildenafil   5. AKI - baseline SCr ~1.2.  -peaked at 1.55 this admit.  Stable 1.0-1.2 now  6. OSA - sleep study with very severe OSA (AHI 69/hr) - non-compliant CPAP  7. H/o GI bleed  - 08/2018 EGD completed and showed gastritis and nonbleeding duodenal ulcers, colonoscopy with 5 polyps removed. Protonix was increased to BID.  - No recent GI bleeding - hgb stable 9-10  - He will stay off aspirin for now.   8. Tobacco Abuse  - says he quit recently  9. Severe protein calorie malnutrition  - nutrition seeing  10. Hyponatremia - Na 125.    - mentating ok. Monitor  - may need tolvaptan    Length of Stay: [address] PA-C  08/22/2019, 2:33 PM  Advanced Heart Failure Team Pager [phone] (M-F; 7a - 4p)  Please contact [facility] for night-coverage after hours (4p -7a ) and weekends on [email or web address]  Patient seen and examined with the above-signed Advanced Practice Provider and/or Housestaff. I personally reviewed laboratory data, imaging studies and relevant notes. I independently examined the patient and formulated the important aspects of the plan. I have edited the note to reflect any of my changes or salient points. I have personally discussed the plan with the patient and/or family.  Went back to OR today for further wound debridement and repacking. Remains sore.  AF overnight. WBC coming down. MAPs and vad parameters stable   General:  NAD.  HEENT: normal  Neck: supple. JVP not elevated.  Carotids 2+ bilat; no bruits. No lymphadenopathy or thryomegaly appreciated. Cor: LVAD hum.  Lungs: Clear. Abdomen: obese + distended. No hepatosplenomegaly. No bruits or masses. Good bowel sounds. Wound packed and dressed. Tender Extremities: no cyanosis, clubbing, rash. Warm no edema  Neuro: alert & oriented x 3. No focal deficits. Moves all 4 without problem   He has large driveline abscess with MSSA. Now s/p debridement x 2  Continue abx and pain control. No heparin for now. Warfarin restarted. Discussed dosing with PharmD personally. VAD interrogated personally. Parameters stable.   Arvilla Meres, MD  7:12 PM

## 2019-08-22 NOTE — Progress Notes (Signed)
CT surgery p.m. Rounds  Patient resting quietly with PCA after debridement and repacking of abdominal wound at the proximal driveline tunnel. Abdominal packing without saturation Follow-up CBC in a.m. INR 1.7, Coumadin 2.5 mg ordered to slowly resume Coumadin as wound will probably not require further sharp debridement and transition to wound VAC care on Friday

## 2019-08-22 NOTE — Progress Notes (Addendum)
LVAD Coordinator Rounding Note:  Admitted 08/16/19 by Dr. [name] due to VAD drive line infection/pneumonia/sepsis  HM III LVAD implanted on 09/06/17 by Dr. Cyndia Bent under Destination Therapy criteria.  Met patient in short stay. Undergoing washout/debridement in OR this morning with Dr Prescott Gum. Patient states he is "really tired" this morning and drifts back to sleep mid conversation. PCA Dilaudid in place.   Vital signs: Temp: 97.5 HR: 106 Doppler Pressure: 90 Automatic BP: 100/86 (93) O2 Sat: 100% on 2L Wt: 227.5>231>235>233.9>236.3  lbs   LVAD interrogation reveals:  Speed: 5600 Flow: 5.1 Power:  4.3w PI: 3.7 Alarms: none Events: none Hematocrit: 31  Fixed speed: 5600 Low speed limit: 5300  Drive Line: CDI. Twice weekly dressing changes using daily kit w/siliver strip by bedside nurse. Next dressing change due 08/23/19.  Sternal wound: Ioban and packing clean, dry, intact. Will be changed today in OR by Dr Prescott Gum  Labs:  LDH trend: 227>230>271>245>233  INR trend: 4.5>5.0>1.7>1.7>1.7  WBC: 27.3>26.2>24>34.6>25.8>20.8  Infection: - blood cultures 08/16/19>>staphylococcus aureus - blood cultures 08/18/19>> NGTD - Drive line tunnel / sternum 08/20/19>> abundant WBC, gram + cocci, culture pending - Drive line AFB 5/4/360>> pending - Drive line/Sternal Fungus culture 08/20/19>> pending   Anticoagulation Plan: -INR Goal: 2.0 - 2.5  -ASA Dose: none  Adverse Events: - driveline cx 67/7/03>> staph aureus - Admitted for sternal/drive line infection. OR for debridement 08/20/19-     Plan/Recommendations:  1. Call VAD pager if any VAD equipment or drive line issues. 2. Twice weekly dressing changes by bedside nurse.  3. VAD coordinator will accompany patient to OR today.   Emerson Monte RN [place] Coordinator  Office: [phone]  24/7 Pager: [phone]

## 2019-08-22 NOTE — Anesthesia Postprocedure Evaluation (Signed)
Anesthesia Post Note  Patient: Jonathan Wood  Procedure(s) Performed: Debridement, Irrigation and Packing of Abdominal Incision. (N/A Abdomen)     Patient location during evaluation: PACU Anesthesia Type: General Level of consciousness: awake and alert Pain management: pain level controlled Vital Signs Assessment: post-procedure vital signs reviewed and stable Respiratory status: spontaneous breathing, nonlabored ventilation, respiratory function stable and patient connected to nasal cannula oxygen Cardiovascular status: blood pressure returned to baseline and stable Postop Assessment: no apparent nausea or vomiting Anesthetic complications: no    Last Vitals:  Vitals:   08/22/19 1120 08/22/19 1125  BP: 90/69   Pulse: (!) 105 (!) 106  Resp: (!) 22 (!) 25  Temp:    SpO2: 97% 100%    Last Pain:  Vitals:   08/22/19 1115  TempSrc:   PainSc: 0-No pain                 Etty Isaac,W. EDMOND

## 2019-08-22 NOTE — Progress Notes (Signed)
Patient ID: Jonathan Wood, male   DOB: 1970-07-08, 49 y.o.   MRN: [id number]         [facility]  Date of Admission:  08/16/2019   Total days of antibiotics 7         ASSESSMENT: I recommend PICC placement and 6 weeks of IV cefazolin followed by long-term suppressive oral cephalexin.  PLAN: 1. Continue cefazolin 2. Continue rifampin until anticoagulation restarted 3. PICC placement  Diagnosis: LVAD infection with bacteremia  Culture Result: MSSA  No Known Allergies  OPAT Orders Discharge antibiotics: Per pharmacy protocol cefazolin Aim for Vancomycin trough 15-20 or AUC 400-550 (unless otherwise indicated) Duration: 6 weeks End Date: 09/26/2019  [facility] Care Per Protocol:  Home health RN for IV administration and teaching; PICC line care and labs.    Labs weekly while on IV antibiotics: _x_ CBC with differential _x_ BMP __ CMP __ CRP __ ESR __ Vancomycin trough __ CK  _x_ Please pull PIC at completion of IV antibiotics __ Please leave PIC in place until doctor has seen patient or been notified  Fax weekly labs to [phone]  Clinic Follow Up Appt: 09/26/2019  Principal Problem:   Infection associated with driveline of left ventricular assist device (LVAD) ([name]) Active Problems:   Bacteremia due to methicillin susceptible Staphylococcus aureus (MSSA)   Elevated LFTs   LVAD (left ventricular assist device) present (HCC)   Normocytic anemia   Chronic combined systolic (congestive) and diastolic (congestive) heart failure (HCC)   OSA on CPAP   Sepsis ([place])   Cigarette smoker   COPD (chronic obstructive pulmonary disease) (HCC)   Hypertension   Scheduled Meds: . acetaminophen  1,000 mg Oral Q6H   Or  . acetaminophen (TYLENOL) oral liquid 160 mg/5 mL  1,000 mg Oral Q6H  . bisacodyl  10 mg Oral Daily  . Chlorhexidine Gluconate Cloth  6 each Topical Daily  . docusate sodium  100 mg Oral TID  . DULoxetine  30 mg Oral  Daily  . feeding supplement (ENSURE ENLIVE)  237 mL Oral TID BM  . ferrous sulfate  325 mg Oral Q breakfast  . gabapentin  300 mg Oral TID  . hydrALAZINE  50 mg Oral TID  . HYDROmorphone   Intravenous Q4H  . linaclotide  145 mcg Oral QODAY  . mouth rinse  15 mL Mouth Rinse BID  . metoCLOPramide (REGLAN) injection  10 mg Intravenous Q6H  . multivitamin with minerals  1 tablet Oral Daily  . pantoprazole  40 mg Oral BID  . rifampin  600 mg Oral Daily  . senna-docusate  1 tablet Oral QHS  . sildenafil  20 mg Oral TID  . sodium chloride flush  10-40 mL Intracatheter Q12H  . sodium chloride flush  3 mL Intravenous Q12H  . warfarin  2.5 mg Oral q1600  . Warfarin - Physician Dosing Inpatient   Does not apply q1600   Continuous Infusions: . sodium chloride 10 mL/hr at 08/22/19 1300  .  ceFAZolin (ANCEF) IV Stopped (08/21/19 2333)  . dextrose 5 % and 0.45% NaCl Stopped (08/22/19 0710)  . phenylephrine (NEO-SYNEPHRINE) Adult infusion     PRN Meds:.sodium chloride, acetaminophen, albuterol, bisacodyl **OR** bisacodyl, diphenhydrAMINE **OR** diphenhydrAMINE, hydrOXYzine, magnesium hydroxide, naloxone **AND** sodium chloride flush, ondansetron (ZOFRAN) IV, ondansetron (ZOFRAN) IV, oxyCODONE, sodium chloride flush, sodium chloride flush, traMADol   SUBJECTIVE: He is very sore following his second operative debridement of his LVAD abscess.  Operative cultures are growing  MSSA as did initial blood cultures.  Repeat blood cultures are negative.  Review of Systems: Review of Systems  Constitutional: Negative for fever.  Gastrointestinal: Negative for nausea and vomiting.    No Known Allergies  OBJECTIVE: Vitals:   08/22/19 1125 08/22/19 1200 08/22/19 1230 08/22/19 1300  BP:    (!) 112/93  Pulse: (!) 106 (!) 102 99 97  Resp: (!) 25 19 (!) 25 (!) 24  Temp:      TempSrc:      SpO2: 100% 100% 99% 100%  Weight:      Height:       Body mass index is 30.34 kg/m.  Physical  Exam Constitutional:      Comments: He appears uncomfortable due to pain.  His mother is at the bedside.  Cardiovascular:     Comments: Stable LVAD hum.    Lab Results Lab Results  Component Value Date   WBC 20.8 (H) 08/22/2019   HGB 9.3 (L) 08/22/2019   HCT 31.0 (L) 08/22/2019   MCV 93.9 08/22/2019   PLT 305 08/22/2019    Lab Results  Component Value Date   CREATININE 1.04 08/22/2019   BUN 25 (H) 08/22/2019   NA 125 (L) 08/22/2019   K 4.9 08/22/2019   CL 91 (L) 08/22/2019   CO2 25 08/22/2019    Lab Results  Component Value Date   ALT 17 08/22/2019   AST 53 (H) 08/22/2019   ALKPHOS 138 (H) 08/22/2019   BILITOT 1.5 (H) 08/22/2019     Microbiology: Recent Results (from the past 240 hour(s))  Culture, blood (routine x 2)     Status: Abnormal   Collection Time: 08/16/19  3:39 PM   Specimen: BLOOD  Result Value Ref Range Status   Specimen Description BLOOD LEFT ANTECUBITAL  Final   Special Requests   Final    BOTTLES DRAWN AEROBIC ONLY Blood Culture adequate volume   Culture  Setup Time   Final    GRAM POSITIVE COCCI IN CLUSTERS AEROBIC BOTTLE ONLY CRITICAL RESULT CALLED TO, READ BACK BY AND VERIFIED WITH: B MANCHERIL PHARMD 2041 08/17/19 A BROWNING    Culture (A)  Final    STAPHYLOCOCCUS AUREUS SUSCEPTIBILITIES PERFORMED ON PREVIOUS CULTURE WITHIN THE LAST 5 DAYS. Performed at [facility], [place] [address], [place], [place] [address]    Report Status 08/19/2019 FINAL  Final  Culture, blood (routine x 2)     Status: Abnormal   Collection Time: 08/16/19  3:41 PM   Specimen: BLOOD  Result Value Ref Range Status   Specimen Description BLOOD RIGHT ANTECUBITAL  Final   Special Requests   Final    BOTTLES DRAWN AEROBIC AND ANAEROBIC Blood Culture adequate volume   Culture  Setup Time   Final    GRAM POSITIVE COCCI IN CLUSTERS IN BOTH AEROBIC AND ANAEROBIC BOTTLES Organism ID to follow CRITICAL RESULT CALLED TO, READ BACK BY AND VERIFIED WITHAsencion Islam  [facility] 2041 08/17/19 A BROWNING Performed at [facility], [place] [address], [place], [place] [address]    Culture STAPHYLOCOCCUS AUREUS (A)  Final   Report Status 08/19/2019 FINAL  Final   Organism ID, Bacteria STAPHYLOCOCCUS AUREUS  Final      Susceptibility   Staphylococcus aureus - MIC*    CIPROFLOXACIN <=0.5 SENSITIVE Sensitive     ERYTHROMYCIN <=0.25 SENSITIVE Sensitive     GENTAMICIN <=0.5 SENSITIVE Sensitive     OXACILLIN 0.5 SENSITIVE Sensitive     TETRACYCLINE <=1  SENSITIVE Sensitive     VANCOMYCIN 1 SENSITIVE Sensitive     TRIMETH/SULFA <=10 SENSITIVE Sensitive     CLINDAMYCIN <=0.25 SENSITIVE Sensitive     RIFAMPIN <=0.5 SENSITIVE Sensitive     Inducible Clindamycin NEGATIVE Sensitive     * STAPHYLOCOCCUS AUREUS  Blood Culture ID Panel (Reflexed)     Status: Abnormal   Collection Time: 08/16/19  3:41 PM  Result Value Ref Range Status   Enterococcus species NOT DETECTED NOT DETECTED Final   Listeria monocytogenes NOT DETECTED NOT DETECTED Final   Staphylococcus species DETECTED (A) NOT DETECTED Final    Comment: CRITICAL RESULT CALLED TO, READ BACK BY AND VERIFIED WITH: B MANCHERIL PHARMD 2041 08/17/19 A BROWNING    Staphylococcus aureus (BCID) DETECTED (A) NOT DETECTED Final    Comment: Methicillin (oxacillin) susceptible Staphylococcus aureus (MSSA). Preferred therapy is anti staphylococcal beta lactam antibiotic (Cefazolin or Nafcillin), unless clinically contraindicated. CRITICAL RESULT CALLED TO, READ BACK BY AND VERIFIED WITH: B MANCHERIL PHARMD 2041 08/17/19 A BROWNING    Methicillin resistance NOT DETECTED NOT DETECTED Final   Streptococcus species NOT DETECTED NOT DETECTED Final   Streptococcus agalactiae NOT DETECTED NOT DETECTED Final   Streptococcus pneumoniae NOT DETECTED NOT DETECTED Final   Streptococcus pyogenes NOT DETECTED NOT DETECTED Final   Acinetobacter baumannii NOT DETECTED NOT DETECTED Final   Enterobacteriaceae species NOT DETECTED NOT  DETECTED Final   Enterobacter cloacae complex NOT DETECTED NOT DETECTED Final   Escherichia coli NOT DETECTED NOT DETECTED Final   Klebsiella oxytoca NOT DETECTED NOT DETECTED Final   Klebsiella pneumoniae NOT DETECTED NOT DETECTED Final   Proteus species NOT DETECTED NOT DETECTED Final   Serratia marcescens NOT DETECTED NOT DETECTED Final   Haemophilus influenzae NOT DETECTED NOT DETECTED Final   Neisseria meningitidis NOT DETECTED NOT DETECTED Final   Pseudomonas aeruginosa NOT DETECTED NOT DETECTED Final   Candida albicans NOT DETECTED NOT DETECTED Final   Candida glabrata NOT DETECTED NOT DETECTED Final   Candida krusei NOT DETECTED NOT DETECTED Final   Candida parapsilosis NOT DETECTED NOT DETECTED Final   Candida tropicalis NOT DETECTED NOT DETECTED Final    Comment: Performed at [facility], [place]. [address], [place], [place] [address]  SARS CORONAVIRUS 2 (TAT 6-24 HRS) Nasopharyngeal Nasopharyngeal Swab     Status: None   Collection Time: 08/16/19  3:54 PM   Specimen: Nasopharyngeal Swab  Result Value Ref Range Status   SARS Coronavirus 2 NEGATIVE NEGATIVE Final    Comment: (NOTE) SARS-CoV-2 target nucleic acids are NOT DETECTED. The SARS-CoV-2 RNA is generally detectable in upper and lower respiratory specimens during the acute phase of infection. Negative results do not preclude SARS-CoV-2 infection, do not rule out co-infections with other pathogens, and should not be used as the sole basis for treatment or other patient management decisions. Negative results must be combined with clinical observations, patient history, and epidemiological information. The expected result is Negative. Fact Sheet for Patients: SugarRoll.be Fact Sheet for Healthcare Providers: https://www.woods-mathews.com/ This test is not yet approved or cleared by the [place] FDA and  has been authorized for detection and/or diagnosis of SARS-CoV-2  by FDA under an Emergency Use Authorization (EUA). This EUA will remain  in effect (meaning this test can be used) for the duration of the COVID-19 declaration under Section 56 4(b)(1) of the Act, 21 U.S.C. section 360bbb-3(b)(1), unless the authorization is terminated or revoked sooner. Performed at [facility], [place],  [place] [address]   MRSA PCR Screening     Status: None   Collection Time: 08/16/19  6:41 PM   Specimen: Nasopharyngeal  Result Value Ref Range Status   MRSA by PCR NEGATIVE NEGATIVE Final    Comment:        The GeneXpert MRSA Assay (FDA approved for NASAL specimens only), is one component of a comprehensive MRSA colonization surveillance program. It is not intended to diagnose MRSA infection nor to guide or monitor treatment for MRSA infections. Performed at [facility], [place] [address], [place], [place] [address]   Culture, blood (routine x 2)     Status: None (Preliminary result)   Collection Time: 08/18/19  2:55 PM   Specimen: BLOOD  Result Value Ref Range Status   Specimen Description BLOOD LEFT ANTECUBITAL  Final   Special Requests   Final    BOTTLES DRAWN AEROBIC AND ANAEROBIC Blood Culture adequate volume   Culture   Final    NO GROWTH 4 DAYS Performed at [facility], [place] [address] [place], [place] [address]    Report Status PENDING  Incomplete  Culture, blood (routine x 2)     Status: None (Preliminary result)   Collection Time: 08/18/19  3:01 PM   Specimen: BLOOD  Result Value Ref Range Status   Specimen Description BLOOD RIGHT ANTECUBITAL  Final   Special Requests   Final    BOTTLES DRAWN AEROBIC AND ANAEROBIC Blood Culture adequate volume   Culture   Final    NO GROWTH 4 DAYS Performed at [facility], [place] [address] [place], [place] [address]    Report Status PENDING  Incomplete  Fungus Culture With Stain     Status: None (Preliminary result)   Collection Time: 08/20/19  3:18 PM   Specimen:  PATH Other; Tissue  Result Value Ref Range Status   Fungus Stain Final report  Final    Comment: (NOTE) Performed At: [facility] [address] [place], [place] [id number] Rush Farmer MD [id number]    Fungus (Mycology) Culture PENDING  Incomplete   Fungal Source VAD DRIVELINE TUNNEL INFECTION  Final  Aerobic/Anaerobic Culture (surgical/deep wound)     Status: None (Preliminary result)   Collection Time: 08/20/19  3:18 PM   Specimen: Wound  Result Value Ref Range Status   Specimen Description WOUND VAD DRIVELINE TUNNEL INFECTION  Final   Special Requests SENT ON SWAB  Final   Gram Stain   Final    ABUNDANT WBC PRESENT,BOTH PMN AND MONONUCLEAR FEW GRAM POSITIVE COCCI Performed at [facility], [place] [address] [place], [place] [address]    Culture   Final    FEW STAPHYLOCOCCUS AUREUS SUSCEPTIBILITIES TO FOLLOW NO ANAEROBES ISOLATED; CULTURE IN PROGRESS FOR 5 DAYS    Report Status PENDING  Incomplete  Fungus Culture Result     Status: None   Collection Time: 08/20/19  3:18 PM  Result Value Ref Range Status   Result 1 Comment  Final    Comment: (NOTE) KOH/Calcofluor preparation:  no fungus observed. Performed At: [facility] [address] [place], [place] [id number] Rush Farmer MD [id number]     Michel Bickers, [place] [facility] [place] [phone] pager   [phone] cell 08/22/2019, 2:10 PM

## 2019-08-22 NOTE — Progress Notes (Signed)
VAD Coordinator Procedure Note:   VAD Coordinator met patient in short stay/OR. Pt undergoing irrigation and debridement of abdominal wound per Dr. Prescott Gum. Hemodynamics and VAD parameters monitored by myself and anesthesia throughout the procedure. Blood pressures were obtained with automatic cuff on right arm and correlated with Doppler.     Time: Doppler Auto  BP Flow PI Power Speed  Pre-procedure:  0725 90 100/86 (93) 5.1 3.7 4.3 5600   0805  114/100 (107) 5.1 3.5 4.1 5600   0830  106/89 (97) 5.2 2.8 4.2 5600   0850  102/75 (86) 5.1 3.6 4.2 5600           Sedation Induction: 0855  89/78 (84) 4.8 4.2 4.2 5600   0900  114/67 (82) 5.3 3.1 4.3 5600   0915  107/82 (91) 5.2 2.7 4.2 5600   0930  108/95 (101) 4.6 3.0 4.1 5600   0945  103/88 (94) 5.0 2.4 4.1 5600   1000  104/76 (84) 4.7 2.9 4.1 5600                    Recovery Area: 1025  117/101 (108) 5.0 3.1 4.3 5600   1045  114/102 (108) 5.0 3.4 4.3 5600   1100  107/87 (93) 4.9 3.6 4.6 5600   1115  114/94 (101) 4.9 3.9 4.3 5600   1130  113/90 (98) 5.0 3.4 4.3 5600             Patient tolerated the procedure well. VAD Coordinator accompanied and remained with patient in recovery area.    Patient Disposition: Received 750cc albumin in OR. Transported back to 2H25.   Emerson Monte RN  [place] Coordinator  Office: [phone]  24/7 Pager: [phone]

## 2019-08-22 NOTE — Op Note (Signed)
NAME: MANDELL, Jonathan Wood MEDICAL RECORD [id number] ACCOUNT [id number] DATE OF BIRTH:[birth year] FACILITY: [facility] LOCATION: [facility] PHYSICIAN:Saadiya Wilfong VAN TRIGT III, MD  OPERATIVE REPORT  DATE OF PROCEDURE:  08/22/2019  PROCEDURES:   1.  Excisional debridement of abdominal wound left ventricular assist device driveline tunnel. 2.  Repacking of abdominal wound with Kerlix soaked in vancomycin.  SURGEON:  Kerin Perna, MD  ANESTHESIA:  General.  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  DESCRIPTION OF PROCEDURE:  The patient was brought from preop holding where informed consent was documented and final issues were addressed with the patient.  The patient was placed supine on the operating table and general anesthesia was induced.  He  remained stable.  The VAD coordinator was present with the VAD equipment and monitored the patient the entire procedure.  The patient was prepped and draped over the abdominal wound as a sterile field.  A proper time-out was performed.  The old packing was carefully removed.  There was minimal bleeding.  There was still some necrotic fat which was debrided sharply from the  abdominal wall.  There was still some milky fluid coming from the area of the connection of the driveline to the LVAD pump beneath the outflow graft.  This was irrigated as well as the entire wound irrigated with a liter of vancomycin loaded saline.  The driveline tunnel extending to the left upper quadrant was opened and sharply debrided of necrotic material.  The wound was then again irrigated.  The wound was then  inspected and hemostasis was achieved.  I did not feel that the wound was ready for  application of ACell product at this time.   A Kerlix gauze soaked in vancomycin saline was then used to pack the entire wound, including its side pockets into the left abdominal wall towards the exit site of the power cord, the proximal power cord where  it connected with the pump and a substernal abdominal wall pocket as well.  Dry dressings were then placed over this and the Ioban sterile dressing was applied.  The patient was then reversed from anesthesia and returned to the recovery room in stable  condition.  Blood loss was minimal, 20 mL.  VN/NUANCE  D:08/22/2019 T:08/22/2019 JOB:[id number]

## 2019-08-22 NOTE — Progress Notes (Signed)
PHARMACY CONSULT NOTE FOR:  OUTPATIENT  PARENTERAL ANTIBIOTIC THERAPY (OPAT)  Indication: bacteremia and drive-line infection Regimen: IV Cefazolin 2g q8 hours End date: 09/26/19  IV antibiotic discharge orders are pended. To discharging provider:  please sign these orders via discharge navigator,  Select New Orders & click on the button choice - Manage This Unsigned Work.     Thank you for allowing pharmacy to be a part of this patient's care.  Alvia Grove, PharmD PGY1 Acute Care Pharmacy Resident 08/22/2019, 3:16 PM

## 2019-08-23 LAB — CBC
HCT: 31 % — ABNORMAL LOW (ref 39.0–52.0)
Hemoglobin: 9 g/dL — ABNORMAL LOW (ref 13.0–17.0)
MCH: 28 pg (ref 26.0–34.0)
MCHC: 29 g/dL — ABNORMAL LOW (ref 30.0–36.0)
MCV: 96.6 fL (ref 80.0–100.0)
Platelets: 299 10*3/uL (ref 150–400)
RBC: 3.21 MIL/uL — ABNORMAL LOW (ref 4.22–5.81)
RDW: 18 % — ABNORMAL HIGH (ref 11.5–15.5)
WBC: 16.9 10*3/uL — ABNORMAL HIGH (ref 4.0–10.5)
nRBC: 2.5 % — ABNORMAL HIGH (ref 0.0–0.2)

## 2019-08-23 LAB — CULTURE, BLOOD (ROUTINE X 2)
Culture: NO GROWTH
Culture: NO GROWTH
Special Requests: ADEQUATE
Special Requests: ADEQUATE

## 2019-08-23 LAB — COMPREHENSIVE METABOLIC PANEL WITH GFR
ALT: 35 U/L (ref 0–44)
AST: 134 U/L — ABNORMAL HIGH (ref 15–41)
Albumin: 2.6 g/dL — ABNORMAL LOW (ref 3.5–5.0)
Alkaline Phosphatase: 177 U/L — ABNORMAL HIGH (ref 38–126)
Anion gap: 11 (ref 5–15)
BUN: 25 mg/dL — ABNORMAL HIGH (ref 6–20)
CO2: 24 mmol/L (ref 22–32)
Calcium: 8.8 mg/dL — ABNORMAL LOW (ref 8.9–10.3)
Chloride: 93 mmol/L — ABNORMAL LOW (ref 98–111)
Creatinine, Ser: 1.12 mg/dL (ref 0.61–1.24)
GFR calc Af Amer: >60 mL/min (ref >60)
GFR calc non Af Amer: >60 mL/min (ref >60)
Glucose, Bld: 96 mg/dL (ref 70–99)
Potassium: 5.1 mmol/L (ref 3.5–5.1)
Sodium: 128 mmol/L — ABNORMAL LOW (ref 135–145)
Total Bilirubin: 1.6 mg/dL — ABNORMAL HIGH (ref 0.3–1.2)
Total Protein: 7.6 g/dL (ref 6.5–8.1)

## 2019-08-23 LAB — LACTATE DEHYDROGENASE: LDH: 307 U/L — ABNORMAL HIGH (ref 98–192)

## 2019-08-23 LAB — COOXEMETRY PANEL
Carboxyhemoglobin: 2.2 % — ABNORMAL HIGH (ref 0.5–1.5)
Methemoglobin: 1.2 % (ref 0.0–1.5)
O2 Saturation: 72.2 %
Total hemoglobin: 9.2 g/dL — ABNORMAL LOW (ref 12.0–16.0)

## 2019-08-23 LAB — PROTIME-INR
INR: 1.8 — ABNORMAL HIGH (ref 0.8–1.2)
Prothrombin Time: 21.1 s — ABNORMAL HIGH (ref 11.4–15.2)

## 2019-08-23 MED ORDER — WARFARIN - PHARMACIST DOSING INPATIENT
Freq: Every day | Status: DC
  Administered 2019-08-24: 17:00:00 1

## 2019-08-23 MED ORDER — SODIUM CHLORIDE 0.9% FLUSH: 10.0000 mL | INTRAVENOUS | Status: DC | PRN

## 2019-08-23 MED ORDER — SODIUM CHLORIDE 0.9% FLUSH
10.0000 mL | Freq: Two times a day (BID) | INTRAVENOUS | Status: DC
  Administered 2019-08-23 – 2019-09-13 (×25): 10 mL

## 2019-08-23 MED ORDER — FUROSEMIDE 10 MG/ML IJ SOLN
80.0000 mg | Freq: Once | INTRAMUSCULAR | Status: AC
  Administered 2019-08-23: 18:00:00 80 mg via INTRAVENOUS
  Filled 2019-08-23: qty 8

## 2019-08-23 MED ORDER — WARFARIN SODIUM 3 MG PO TABS
3.0000 mg | ORAL_TABLET | Freq: Once | ORAL | Status: AC
  Administered 2019-08-23: 17:00:00 3 mg via ORAL
  Filled 2019-08-23: qty 1

## 2019-08-23 MED FILL — Heparin Sodium (Porcine) Inj 1000 Unit/ML: INTRAMUSCULAR | Qty: 30 | Status: AC

## 2019-08-23 MED FILL — Sodium Chloride IV Soln 0.9%: INTRAVENOUS | Qty: 2000 | Status: AC

## 2019-08-23 NOTE — Progress Notes (Signed)
PHARMACY CONSULT NOTE FOR:  OUTPATIENT  PARENTERAL ANTIBIOTIC THERAPY (OPAT)  Indication: bacteremia and drive-line infection Regimen: IV Cefazolin 2g q8 hours End date: 10/03/19  IV antibiotic discharge orders are pended. To discharging provider:  please sign these orders via discharge navigator,  Select New Orders & click on the button choice - Manage This Unsigned Work.     Thank you for allowing pharmacy to be a part of this patient's care.  Sharin Mons, PharmD, BCPS, BCIDP Infectious Diseases Clinical Pharmacist Phone: [phone] 08/23/2019, 10:11 AM

## 2019-08-23 NOTE — Progress Notes (Signed)
INutrition Follow up  DOCUMENTATION CODES:   Not applicable  INTERVENTION:    Ensure Enlive po TID, each supplement provides 350 kcal and 20 grams of protein  MVI with Minerals  NUTRITION DIAGNOSIS:   Inadequate oral intake related to decreased appetite as evidenced by per patient/family report.  Ongoing  GOAL:   Patient will meet greater than or equal to 90% of their needs   Progressing  MONITOR:   PO intake, Supplement acceptance, Labs, Weight trends  REASON FOR ASSESSMENT:   Consult, Malnutrition Screening Tool Assessment of nutrition requirement/status  ASSESSMENT:   50 yo male admitted with suspected driveline infection with HM-3 LVAD placement on 09/06/17. PMH includes NICM with EF 10%, LVAD, HTN, EtOH abuse   4/5- I&D with excisional debridement of HeartMate 3 4/7- excisional debridement of HeartMate 3, repacking abdominal wound  Plan to return to OR tomorrow for I&D with wound VAC  Pt reports appetite slow to progress. Meal completions charted as 0-80% for his last six meals. Drinking Ensure 2-3 times daily but only likes strawberry. RD to make adjustments. NFPE completed. No depletions found.   Admission weight: 105 kg  Current weight: 107.3 kg   I/O: +6,266 ml since admit  UOP: 700 ml x 24 hrs   Medications: dulcolax, colace, ferrous sulfate, reglan QID, MVI with minerals, senokot Labs: Na 128 (L) CBG 96-176  Diet Order:   Diet Order            Diet NPO time specified  Diet effective midnight        Diet heart healthy/carb modified Room service appropriate? Yes; Fluid consistency: Thin  Diet effective now              EDUCATION NEEDS:   Not appropriate for education at this time  Skin:  Skin Assessment: Skin Integrity Issues: Skin Integrity Issues:: Incisions Incisions: abdomen, chest  Last BM:  4/6  Height:   Ht Readings from Last 1 Encounters:  08/17/19 6\' 2"  (1.88 m)    Weight:   Wt Readings from Last 1 Encounters:   08/23/19 107.3 kg   BMI:  Body mass index is 30.37 kg/m.  Estimated Nutritional Needs:   Kcal:  2200-2400 kcals  Protein:  115-130 g  Fluid:  >/= 1.8 L  10/23/19 RD, LDN Clinical Nutrition Pager listed in AMION

## 2019-08-23 NOTE — Progress Notes (Signed)
1 Peripherally Inserted Central Catheter Placement  The IV Nurse has discussed with the patient and/or persons authorized to consent for the patient, the purpose of this procedure and the potential benefits and risks involved with this procedure.  The benefits include less needle sticks, lab draws from the catheter, and the patient may be discharged home with the catheter. Risks include, but not limited to, infection, bleeding, blood clot (thrombus formation), and puncture of an artery; nerve damage and irregular heartbeat and possibility to perform a PICC exchange if needed/ordered by physician.  Alternatives to this procedure were also discussed.  Bard Power PICC patient education guide, fact sheet on infection prevention and patient information card has been provided to patient /or left at bedside.    PICC Placement Documentation  PICC Single Lumen 08/23/19 PICC Right Basilic 42 cm 2 cm (Active)  Indication for Insertion or Continuance of Line Home intravenous therapies (PICC only) 08/23/19 0924  Exposed Catheter (cm) 2 cm 08/23/19 0924  Site Assessment Clean;Dry;Intact 08/23/19 0924  Line Status Flushed;Saline locked;Blood return noted 08/23/19 0924  Dressing Type Transparent;Securing device 08/23/19 5872  Dressing Status Clean;Dry;Intact;Antimicrobial disc in place 08/23/19 0924  Dressing Change Due 08/30/19 08/23/19 0924       Romie Jumper 08/23/2019, 9:33 AM

## 2019-08-23 NOTE — Progress Notes (Signed)
ANTICOAGULATION CONSULT NOTE - Follow Up Consult  Pharmacy Consult for warfarin Indication: LVAD  No Known Allergies  Patient Measurements: Height: 6\' 2"  (188 cm) Weight: 107.3 kg (236 lb 8.9 oz) IBW/kg (Calculated) : 82.2  Vital Signs: Temp: 97.9 F (36.6 C) (04/08 0807) Temp Source: Oral (04/08 0417) BP: 102/51 (04/08 0800) Pulse Rate: 106 (04/08 0800)  Labs: Recent Labs    08/21/19 0507 08/21/19 0507 08/22/19 0327 08/23/19 0426  HGB 9.3*   < > 9.3* 9.0*  HCT 30.5*  --  31.0* 31.0*  PLT 307  --  305 299  LABPROT 20.2*  --  19.6* 21.1*  INR 1.7*  --  1.7* 1.8*  CREATININE 1.17  --  1.04 1.12   < > = values in this interval not displayed.    Estimated Creatinine Clearance: 107.5 mL/min (by C-G formula based on SCr of 1.12 mg/dL).   Medical History: Past Medical History:  Diagnosis Date  . Asthma   . CHF (congestive heart failure) (HCC)    a. 09/2016: EF 20-25% with cath showing normal cors  . GERD (gastroesophageal reflux disease)   . History of hiatal hernia   . OSA on CPAP 09/06/2018   Severe OSA with AHI 68/hr on CPAP at 12cm H2O    Assessment: 48yom with HF s/p LVAD implant HM3 place 4/19 admitted with drive line infection.    Warfarin PTA 7.5mg  MW and 5mg  all other days admit INR elevated at 5.    INR today came back at 1.8, slightly subtherapeutic. Hgb 9, plt 299. LDH 307. No s/sx of bleeding. Off rifampin given on warfarin now. No heparin infusion at this time per MD.  Goal of Therapy:  INR 2-2.5 Monitor platelets by anticoagulation protocol: Yes   Plan:  Holding heparin infusion for now Will increase warfarin to 3 mg to get into goal range Follow up plan post wound debridement  Daily INR  5/19, PharmD, BCCCP Clinical Pharmacist  Phone: [phone] 08/23/2019 11:15 AM  Please check AMION for all [facility] Pharmacy phone numbers After 10:00 PM, call Main Pharmacy [phone]

## 2019-08-23 NOTE — Progress Notes (Signed)
LVAD Coordinator Rounding Note:  Admitted 08/16/19 by Dr. [name] due to VAD drive line infection/pneumonia/sepsis  HM III LVAD implanted on 09/06/17 by Dr. Cyndia Bent under Destination Therapy criteria.  Patient laying in bed this morning. States he did not sleep well overnight. Rates pain 6/10. PCA Dilaudid in place- using frequently. Plan to return to OR tomorrow for washout and placement of wound vac with Dr Prescott Gum.   Vital signs: Temp: 97.9 HR: 108 Doppler Pressure: 100 Automatic BP: 99/70 (79) O2 Sat: 100% on 2L Wt: 227.5>231>235>233.9>236.3>236.5  lbs   LVAD interrogation reveals:  Speed: 5600 Flow: 5.1 Power:  4.3w PI: 3.1 Alarms: none Events: none Hematocrit: 31  Fixed speed: 5600 Low speed limit: 5300  Drive Line: CDI. Twice weekly dressing changes using daily kit w/siliver strip by bedside nurse. Next dressing change due 08/24/19.  Sternal wound: [place] and packing clean, dry, intact. Will change tomorrow in OR per Dr Prescott Gum.   Labs:  LDH trend: 227>230>271>245>233>307  INR trend: 4.5>5.0>1.7>1.7>1.7>1.8  WBC: 27.3>26.2>24>34.6>25.8>20.8>16.9  Alk Phos: 138> 177  AST: 53> 154  ALT: 17> 35  Infection: - blood cultures 08/16/19>>staphylococcus aureus - blood cultures 08/18/19>> NGTD - Drive line tunnel / sternum 08/20/19>> abundant WBC, gram + cocci, culture- few staph aureus - Drive line AFB 9/5/093>> pending - Drive line/Sternal Fungus culture 08/20/19>> negative   Anticoagulation Plan: -INR Goal: 2.0 - 2.5  -ASA Dose: none  Adverse Events: - driveline cx 26/7/12>> staph aureus - Admitted for sternal/drive line infection. OR for debridement 08/20/19- + staph aureus wound cultures   Plan/Recommendations:  1. Call VAD pager if any VAD equipment or drive line issues. 2. Twice weekly dressing changes by bedside nurse.  3. VAD coordinator will accompany patient to OR tomorrow.   Emerson Monte RN [place] Coordinator  Office: [phone]  24/7 Pager:  [phone]

## 2019-08-23 NOTE — Progress Notes (Signed)
1 Day Post-Op Procedure(s) (LRB): Debridement, Irrigation and Packing of Abdominal Incision. (N/A) Subjective: MSSA deep abdominal driveline tunnel infection, on iv Ancef and OR wound debridements Plan to return to OR tomorrow for irrigation and wound VAC with Acell product INR  1.8 on po coumadin WBC decreasing daily Objective: Vital signs in last 24 hours: Temp:  [97.7 F (36.5 C)-98.2 F (36.8 C)] 97.9 F (36.6 C) (04/08 1146) Pulse Rate:  [76-106] 106 (04/08 1200) Cardiac Rhythm: Sinus tachycardia (04/08 1200) Resp:  [15-26] 16 (04/08 1235) BP: (82-121)/(51-97) 100/78 (04/08 1200) SpO2:  [95 %-100 %] 100 % (04/08 1235) Weight:  [107.3 kg] 107.3 kg (04/08 0500)  Hemodynamic parameters for last 24 hours:    Intake/Output from previous day: 04/07 0701 - 04/08 0700 In: 2344.7 [P.O.:300; I.V.:1094.6; IV Piggyback:950.1] Out: 720 [Urine:700; Blood:20] Intake/Output this shift: Total I/O In: 390 [P.O.:240; I.V.:50; IV Piggyback:100] Out: 150 [Urine:150]  Abdominal packing/essing intact Normal VAD hum Lab Results: Recent Labs    08/22/19 0327 08/23/19 0426  WBC 20.8* 16.9*  HGB 9.3* 9.0*  HCT 31.0* 31.0*  PLT 305 299   BMET:  Recent Labs    08/22/19 0327 08/23/19 0426  NA 125* 128*  K 4.9 5.1  CL 91* 93*  CO2 25 24  GLUCOSE 129* 96  BUN 25* 25*  CREATININE 1.04 1.12  CALCIUM 8.5* 8.8*    PT/INR:  Recent Labs    08/23/19 0426  LABPROT 21.1*  INR 1.8*   ABG    Component Value Date/Time   PHART 7.405 09/08/2017 0049   HCO3 23.4 09/08/2017 0049   TCO2 32 09/12/2017 1605   ACIDBASEDEF 1.0 09/08/2017 0049   O2SAT 72.2 08/23/2019 0426   CBG (last 3)  Recent Labs    08/21/19 1614  GLUCAP 144*    Assessment/Plan: S/P Procedure(s) (LRB): Debridement, Irrigation and Packing of Abdominal Incision. (N/A) Return to OR tomorrow hopeful that wound will be clean and ready for ACell, VAC   LOS: 7 days    Jonathan Wood 08/23/2019

## 2019-08-23 NOTE — Progress Notes (Signed)
[facility]  Date of Admission:  08/16/2019     Total days of antibiotics 8         ASSESSMENT:  [name] is POD #1 from repeat I&D with plans for wound vac placement on 4/9. Blood cultures are negative with clearance of MSSA bacteremia and PICC line placed this morning. Anticoagulation restarted today and have discontinue rifampin. Will place OPAT orders for home therapy with end date of Ancef scheduled for 10/03/19. Will need to change to 500 mg Cephalexin three times daily at the end of IV treatment for chronic suppression. Will arrange follow up in ID clinic. ID will sign off and be available as needed.    PLAN:  1. Continue Ancef. 2. Discontinue rifampin.  3. OPAT orders placed below.  4. Continue wound care per CVTS. 5. Follow up in ID office  Diagnosis: MSSA Abscess Complicating LVAD and Bacteremia   Culture Result: MSSA  No Known Allergies  OPAT Orders Discharge antibiotics: Ancef Per pharmacy protocol  Aim for Vancomycin trough 15-20 or AUC 400-550 (unless otherwise indicated) Duration: 6 Weeks End Date: 10/03/19  [facility] Care Per Protocol:  Home health RN for IV administration and teaching; PICC line care and labs.    Labs weekly while on IV antibiotics: _X_ CBC with differential __ BMP _X_ CMP _X_ CRP _X_ ESR __ Vancomycin trough __ CK  _X_ Please pull PIC at completion of IV antibiotics __ Please leave PIC in place until doctor has seen patient or been notified  Fax weekly labs to [phone]  Clinic Follow Up Appt:  09/26/19 with Dr. Megan Salon     Principal Problem:   Infection associated with driveline of left ventricular assist device (LVAD) [facility]) Active Problems:   Elevated LFTs   LVAD (left ventricular assist device) present ([name])   Normocytic anemia   Chronic combined systolic (congestive) and diastolic (congestive) heart failure (HCC)   OSA on CPAP   Sepsis (Jonathan Wood)   Cigarette smoker   COPD (chronic  obstructive pulmonary disease) ([name])   Hypertension   Bacteremia due to methicillin susceptible Staphylococcus aureus (MSSA)   . acetaminophen  1,000 mg Oral Q6H   Or  . acetaminophen (TYLENOL) oral liquid 160 mg/5 mL  1,000 mg Oral Q6H  . bisacodyl  10 mg Oral Daily  . Chlorhexidine Gluconate Cloth  6 each Topical Daily  . docusate sodium  100 mg Oral TID  . DULoxetine  30 mg Oral Daily  . feeding supplement (ENSURE ENLIVE)  237 mL Oral TID BM  . ferrous sulfate  325 mg Oral Q breakfast  . gabapentin  300 mg Oral TID  . hydrALAZINE  50 mg Oral TID  . HYDROmorphone   Intravenous Q4H  . linaclotide  145 mcg Oral QODAY  . mouth rinse  15 mL Mouth Rinse BID  . metoCLOPramide (REGLAN) injection  10 mg Intravenous Q6H  . multivitamin with minerals  1 tablet Oral Daily  . pantoprazole  40 mg Oral BID  . senna-docusate  1 tablet Oral QHS  . sildenafil  20 mg Oral TID  . sodium chloride flush  10-40 mL Intracatheter Q12H  . sodium chloride flush  10-40 mL Intracatheter Q12H  . sodium chloride flush  3 mL Intravenous Q12H  . warfarin  2.5 mg Oral q1600  . Warfarin - Physician Dosing Inpatient   Does not apply q1600    SUBJECTIVE:  Afebrile overnight with no acute events. POD from repeat debridement  with wound vac placement for tomorrow. Feeling okay but loopy this morning.   No Known Allergies   Review of Systems: Review of Systems  Constitutional: Negative for chills, fever and weight loss.  Respiratory: Negative for cough, shortness of breath and wheezing.   Cardiovascular: Negative for chest pain and leg swelling.  Gastrointestinal: Negative for abdominal pain, constipation, diarrhea, nausea and vomiting.  Skin: Negative for rash.    OBJECTIVE: Vitals:   08/23/19 0600 08/23/19 0700 08/23/19 0800 08/23/19 0807  BP: 102/86 99/70 (!) 102/51   Pulse: (!) 105 (!) 102 (!) 106   Resp: (!) 21 (!) 23 (!) 26   Temp:    97.9 F (36.6 C)  TempSrc:      SpO2: 100% 99% 95%     Weight:      Height:       Body mass index is 30.37 kg/m.  Physical Exam Constitutional:      General: He is not in acute distress.    Appearance: He is well-developed.     Comments: Lying in bed with head of bed elevated; drowsy  Cardiovascular:     Rate and Rhythm: Normal rate and regular rhythm.     Comments: Surgical dressing is clean and dry. PICC line in Right upper arm with clean and dry dressing and without evidence of infection. LVAD hum Pulmonary:     Effort: Pulmonary effort is normal.     Breath sounds: Normal breath sounds.  Skin:    General: Skin is warm and dry.  Neurological:     Mental Status: He is oriented to person, place, and time. He is lethargic.     Lab Results Lab Results  Component Value Date   WBC 16.9 (H) 08/23/2019   HGB 9.0 (L) 08/23/2019   HCT 31.0 (L) 08/23/2019   MCV 96.6 08/23/2019   PLT 299 08/23/2019    Lab Results  Component Value Date   CREATININE 1.12 08/23/2019   BUN 25 (H) 08/23/2019   NA 128 (L) 08/23/2019   K 5.1 08/23/2019   CL 93 (L) 08/23/2019   CO2 24 08/23/2019    Lab Results  Component Value Date   ALT 35 08/23/2019   AST 134 (H) 08/23/2019   ALKPHOS 177 (H) 08/23/2019   BILITOT 1.6 (H) 08/23/2019     Microbiology: Recent Results (from the past 240 hour(s))  Culture, blood (routine x 2)     Status: Abnormal   Collection Time: 08/16/19  3:39 PM   Specimen: BLOOD  Result Value Ref Range Status   Specimen Description BLOOD LEFT ANTECUBITAL  Final   Special Requests   Final    BOTTLES DRAWN AEROBIC ONLY Blood Culture adequate volume   Culture  Setup Time   Final    GRAM POSITIVE COCCI IN CLUSTERS AEROBIC BOTTLE ONLY CRITICAL RESULT CALLED TO, READ BACK BY AND VERIFIED WITH: B MANCHERIL PHARMD 2041 08/17/19 A BROWNING    Culture (A)  Final    STAPHYLOCOCCUS AUREUS SUSCEPTIBILITIES PERFORMED ON PREVIOUS CULTURE WITHIN THE LAST 5 DAYS. Performed at [facility], [place] [address], [place],   [address]    Report Status 08/19/2019 FINAL  Final  Culture, blood (routine x 2)     Status: Abnormal   Collection Time: 08/16/19  3:41 PM   Specimen: BLOOD  Result Value Ref Range Status   Specimen Description BLOOD RIGHT ANTECUBITAL  Final   Special Requests   Final    BOTTLES DRAWN AEROBIC AND  ANAEROBIC Blood Culture adequate volume   Culture  Setup Time   Final    GRAM POSITIVE COCCI IN CLUSTERS IN BOTH AEROBIC AND ANAEROBIC BOTTLES Organism ID to follow CRITICAL RESULT CALLED TO, READ BACK BY AND VERIFIED WITHAsencion Islam PHARMD 2041 08/17/19 A BROWNING Performed at [facility], [place] [address], [place], [place] [address]    Culture STAPHYLOCOCCUS AUREUS (A)  Final   Report Status 08/19/2019 FINAL  Final   Organism ID, Bacteria STAPHYLOCOCCUS AUREUS  Final      Susceptibility   Staphylococcus aureus - MIC*    CIPROFLOXACIN <=0.5 SENSITIVE Sensitive     ERYTHROMYCIN <=0.25 SENSITIVE Sensitive     GENTAMICIN <=0.5 SENSITIVE Sensitive     OXACILLIN 0.5 SENSITIVE Sensitive     TETRACYCLINE <=1 SENSITIVE Sensitive     VANCOMYCIN 1 SENSITIVE Sensitive     TRIMETH/SULFA <=10 SENSITIVE Sensitive     CLINDAMYCIN <=0.25 SENSITIVE Sensitive     RIFAMPIN <=0.5 SENSITIVE Sensitive     Inducible Clindamycin NEGATIVE Sensitive     * STAPHYLOCOCCUS AUREUS  Blood Culture ID Panel (Reflexed)     Status: Abnormal   Collection Time: 08/16/19  3:41 PM  Result Value Ref Range Status   Enterococcus species NOT DETECTED NOT DETECTED Final   Listeria monocytogenes NOT DETECTED NOT DETECTED Final   Staphylococcus species DETECTED (A) NOT DETECTED Final    Comment: CRITICAL RESULT CALLED TO, READ BACK BY AND VERIFIED WITH: B MANCHERIL PHARMD 2041 08/17/19 A BROWNING    Staphylococcus aureus (BCID) DETECTED (A) NOT DETECTED Final    Comment: Methicillin (oxacillin) susceptible Staphylococcus aureus (MSSA). Preferred therapy is anti staphylococcal beta lactam antibiotic (Cefazolin or Nafcillin),  unless clinically contraindicated. CRITICAL RESULT CALLED TO, READ BACK BY AND VERIFIED WITH: B MANCHERIL PHARMD 2041 08/17/19 A BROWNING    Methicillin resistance NOT DETECTED NOT DETECTED Final   Streptococcus species NOT DETECTED NOT DETECTED Final   Streptococcus agalactiae NOT DETECTED NOT DETECTED Final   Streptococcus pneumoniae NOT DETECTED NOT DETECTED Final   Streptococcus pyogenes NOT DETECTED NOT DETECTED Final   Acinetobacter baumannii NOT DETECTED NOT DETECTED Final   Enterobacteriaceae species NOT DETECTED NOT DETECTED Final   Enterobacter cloacae complex NOT DETECTED NOT DETECTED Final   Escherichia coli NOT DETECTED NOT DETECTED Final   Klebsiella oxytoca NOT DETECTED NOT DETECTED Final   Klebsiella pneumoniae NOT DETECTED NOT DETECTED Final   Proteus species NOT DETECTED NOT DETECTED Final   Serratia marcescens NOT DETECTED NOT DETECTED Final   Haemophilus influenzae NOT DETECTED NOT DETECTED Final   Neisseria meningitidis NOT DETECTED NOT DETECTED Final   Pseudomonas aeruginosa NOT DETECTED NOT DETECTED Final   Candida albicans NOT DETECTED NOT DETECTED Final   Candida glabrata NOT DETECTED NOT DETECTED Final   Candida krusei NOT DETECTED NOT DETECTED Final   Candida parapsilosis NOT DETECTED NOT DETECTED Final   Candida tropicalis NOT DETECTED NOT DETECTED Final    Comment: Performed at [facility], [place]. [address], [place], [place] [address]  SARS CORONAVIRUS 2 (TAT 6-24 HRS) Nasopharyngeal Nasopharyngeal Swab     Status: None   Collection Time: 08/16/19  3:54 PM   Specimen: Nasopharyngeal Swab  Result Value Ref Range Status   SARS Coronavirus 2 NEGATIVE NEGATIVE Final    Comment: (NOTE) SARS-CoV-2 target nucleic acids are NOT DETECTED. The SARS-CoV-2 RNA is generally detectable in upper and lower respiratory specimens during the acute phase of infection. Negative results do not preclude SARS-CoV-2  infection, do not rule out co-infections with other  pathogens, and should not be used as the sole basis for treatment or other patient management decisions. Negative results must be combined with clinical observations, patient history, and epidemiological information. The expected result is Negative. Fact Sheet for Patients: SugarRoll.be Fact Sheet for Healthcare Providers: https://www.woods-mathews.com/ This test is not yet approved or cleared by the [place] FDA and  has been authorized for detection and/or diagnosis of SARS-CoV-2 by FDA under an Emergency Use Authorization (EUA). This EUA will remain  in effect (meaning this test can be used) for the duration of the COVID-19 declaration under Section 56 4(b)(1) of the Act, 21 U.S.C. section 360bbb-3(b)(1), unless the authorization is terminated or revoked sooner. Performed at [facility], [place] [address] [place], [place] [address]   MRSA PCR Screening     Status: None   Collection Time: 08/16/19  6:41 PM   Specimen: Nasopharyngeal  Result Value Ref Range Status   MRSA by PCR NEGATIVE NEGATIVE Final    Comment:        The GeneXpert MRSA Assay (FDA approved for NASAL specimens only), is one component of a comprehensive MRSA colonization surveillance program. It is not intended to diagnose MRSA infection nor to guide or monitor treatment for MRSA infections. Performed at [facility], [place] [address], [place], [place] [address]   Culture, blood (routine x 2)     Status: None   Collection Time: 08/18/19  2:55 PM   Specimen: BLOOD  Result Value Ref Range Status   Specimen Description BLOOD LEFT ANTECUBITAL  Final   Special Requests   Final    BOTTLES DRAWN AEROBIC AND ANAEROBIC Blood Culture adequate volume   Culture   Final    NO GROWTH 5 DAYS Performed at [facility], [place] [address], [place], [place] [address]    Report Status 08/23/2019 FINAL  Final  Culture, blood (routine x 2)     Status: None    Collection Time: 08/18/19  3:01 PM   Specimen: BLOOD  Result Value Ref Range Status   Specimen Description BLOOD RIGHT ANTECUBITAL  Final   Special Requests   Final    BOTTLES DRAWN AEROBIC AND ANAEROBIC Blood Culture adequate volume   Culture   Final    NO GROWTH 5 DAYS Performed at [facility], [place] [address], [place], [place] [address]    Report Status 08/23/2019 FINAL  Final  Fungus Culture With Stain     Status: None (Preliminary result)   Collection Time: 08/20/19  3:18 PM   Specimen: PATH Other; Tissue  Result Value Ref Range Status   Fungus Stain Final report  Final    Comment: (NOTE) Performed At: [facility] [place], [place] [id number] Rush Farmer MD [id number]    Fungus (Mycology) Culture PENDING  Incomplete   Fungal Source VAD DRIVELINE TUNNEL INFECTION  Final  Aerobic/Anaerobic Culture (surgical/deep wound)     Status: None (Preliminary result)   Collection Time: 08/20/19  3:18 PM   Specimen: Wound  Result Value Ref Range Status   Specimen Description WOUND VAD DRIVELINE TUNNEL INFECTION  Final   Special Requests SENT ON SWAB  Final   Gram Stain   Final    ABUNDANT WBC PRESENT,BOTH PMN AND MONONUCLEAR FEW GRAM POSITIVE COCCI Performed at [facility], [place] [address] [place], [place] [address]    Culture   Final    FEW  STAPHYLOCOCCUS AUREUS SUSCEPTIBILITIES TO FOLLOW NO ANAEROBES ISOLATED; CULTURE IN PROGRESS FOR 5 DAYS    Report Status PENDING  Incomplete  Fungus Culture Result     Status: None   Collection Time: 08/20/19  3:18 PM  Result Value Ref Range Status   Result 1 Comment  Final    Comment: (NOTE) KOH/Calcofluor preparation:  no fungus observed. Performed At: [facility] [address] [place], [place] [id number] Rush Farmer MD HW:[id number]      Terri Piedra, [facility]  08/23/2019  10:05 AM

## 2019-08-23 NOTE — Progress Notes (Addendum)
Advanced Heart Failure Rounding Note  PCP-Cardiologist: Arvilla Meres, MD    Patient Profile   49 y.o. 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  Subjective:    - 4/5 S/p I&D w/ excisional debridement of subxiphoid abscess. Cultures + Staph Aureus  - 4/7, Returned to OR for debridement, Irrigation and Packing of Abdominal Incision  Blood cultures positive for MSSA.  On cefazolin and rifampin. ID following.   WBC improving, down from 34>>25>>20>>16K. AF. Sinus tach 110s.   Continues w/ 6/10 pain. Using pain pump.    VAD Interrogation: Speed:5650, Flow:5.1, Power:4.3, PI:3.2, no PI events  VAD interrogated personally. Parameters stable.   CT of C/A/P 08/16/19 IMPRESSION: 1. New fluid collections along the efferent tubing from the ventricular cyst device to the ascending aorta. Findings are suspicious for infection given clinical presentation and elevated white blood cell count. 2. Stable fluid surrounding the line within the left upper quadrant anterior abdominal wall, with decreased adjacent fat stranding. Infection in this region cannot be excluded. 3. Stable cardiomegaly. 4. Stable emphysema. 5. Otherwise no acute intra-abdominal or intrapelvic process.  Objective:   Weight Range: 107.3 kg Body mass index is 30.37 kg/m.   Vital Signs:   Temp:  [97 F (36.1 C)-98.2 F (36.8 C)] 97.9 F (36.6 C) (04/08 0807) Pulse Rate:  [38-108] 106 (04/08 0800) Resp:  [15-28] 26 (04/08 0800) BP: (82-121)/(51-97) 102/51 (04/08 0800) SpO2:  [95 %-100 %] 95 % (04/08 0800) Weight:  [107.3 kg] 107.3 kg (04/08 0500) Last BM Date: 08/21/19  Weight change: Filed Weights   08/21/19 0500 08/22/19 0500 08/23/19 0500  Weight: 106.1 kg 107.2 kg 107.3 kg    Intake/Output:   Intake/Output Summary (Last 24 hours) at  08/23/2019 1041 Last data filed at 08/23/2019 0800 Gross per 24 hour  Intake 1214.69 ml  Output 700 ml  Net 514.69 ml      Physical Exam    General:  [name] laying in bed, abdomen distended No respiratory difficulty  HEENT: normal Neck: supple. No JVD + RIJ CVC Carotids 2+ bilat; no bruits. No lymphadenopathy or thryomegaly appreciated. Cor: +LVAD HUM, sternal area/ driveline site covered w/ dry dressings and Ioban barrier dressing Lungs: CTAB Abdomen: markedly distended, mildly tender No hepatosplenomegaly. No bruits or masses. Good bowel sounds. Drive line site: covered w/ clean/dry bandage  Extremities: no cyanosis, clubbing, rash, no edema + RUE PICC  Neuro: alert & orientedx3, cranial nerves grossly intact. moves all 4 extremities w/o difficulty. Affect pleasant    Labs    CBC Recent Labs    08/22/19 0327 08/23/19 0426  WBC 20.8* 16.9*  HGB 9.3* 9.0*  HCT 31.0* 31.0*  MCV 93.9 96.6  PLT 305 299   Basic Metabolic Panel Recent Labs    53/64/68 0327 08/23/19 0426  NA 125* 128*  K 4.9 5.1  CL 91* 93*  CO2 25 24  GLUCOSE 129* 96  BUN 25* 25*  CREATININE 1.04 1.12  CALCIUM 8.5* 8.8*   Liver Function Tests Recent Labs    08/22/19 0327 08/23/19 0426  AST 53* 134*  ALT 17 35  ALKPHOS 138* 177*  BILITOT 1.5* 1.6*  PROT 7.7 7.6  ALBUMIN 2.0* 2.6*   No results for input(s): LIPASE, AMYLASE in the last 72 hours. Cardiac Enzymes No results for input(s): CKTOTAL, CKMB, CKMBINDEX, TROPONINI in the  last 72 hours.  BNP: BNP (last 3 results) Recent Labs    03/21/19 1415 04/23/19 1350  BNP 426.9* 709.6*    ProBNP (last 3 results) No results for input(s): PROBNP in the last 8760 hours.   D-Dimer No results for input(s): DDIMER in the last 72 hours. Hemoglobin A1C No results for input(s): HGBA1C in the last 72 hours. Fasting Lipid Panel No results for input(s): CHOL, HDL, LDLCALC, TRIG, CHOLHDL, LDLDIRECT in the last 72 hours. Thyroid Function Tests No  results for input(s): TSH, T4TOTAL, T3FREE, THYROIDAB in the last 72 hours.  Invalid input(s): FREET3  Other results:   Imaging    [place] EKG SITE RITE  Result Date: 08/22/2019 If Site Rite image not attached, placement could not be confirmed due to current cardiac rhythm.    Medications:     Scheduled Medications: . acetaminophen  1,000 mg Oral Q6H   Or  . acetaminophen (TYLENOL) oral liquid 160 mg/5 mL  1,000 mg Oral Q6H  . bisacodyl  10 mg Oral Daily  . Chlorhexidine Gluconate Cloth  6 each Topical Daily  . docusate sodium  100 mg Oral TID  . DULoxetine  30 mg Oral Daily  . feeding supplement (ENSURE ENLIVE)  237 mL Oral TID BM  . ferrous sulfate  325 mg Oral Q breakfast  . gabapentin  300 mg Oral TID  . hydrALAZINE  50 mg Oral TID  . HYDROmorphone   Intravenous Q4H  . linaclotide  145 mcg Oral QODAY  . mouth rinse  15 mL Mouth Rinse BID  . metoCLOPramide (REGLAN) injection  10 mg Intravenous Q6H  . multivitamin with minerals  1 tablet Oral Daily  . pantoprazole  40 mg Oral BID  . senna-docusate  1 tablet Oral QHS  . sildenafil  20 mg Oral TID  . sodium chloride flush  10-40 mL Intracatheter Q12H  . sodium chloride flush  10-40 mL Intracatheter Q12H  . sodium chloride flush  3 mL Intravenous Q12H  . warfarin  2.5 mg Oral q1600  . Warfarin - Physician Dosing Inpatient   Does not apply q1600    Infusions: . sodium chloride 10 mL/hr at 08/23/19 0800  .  ceFAZolin (ANCEF) IV Stopped (08/23/19 3149)  . dextrose 5 % and 0.45% NaCl Stopped (08/22/19 0710)  . phenylephrine (NEO-SYNEPHRINE) Adult infusion      PRN Medications: sodium chloride, acetaminophen, albuterol, bisacodyl **OR** bisacodyl, diphenhydrAMINE **OR** diphenhydrAMINE, hydrOXYzine, magnesium hydroxide, naloxone **AND** sodium chloride flush, ondansetron (ZOFRAN) IV, ondansetron (ZOFRAN) IV, oxyCODONE, sodium chloride flush, sodium chloride flush, sodium chloride flush, traMADol    Assessment/Plan     1. Fever & leukocytosis -> MSSA bacteremia/ Subxiphoid Abscess  - bcx 2/2 MSSA - ID has changed antibiotics to cefazolin and rifampin. - WBC 24 -> 29->34K-> 25->20->16K  - He is not a candidate for VAD exchange due to severe RV failure and noncompliance.  - S/p I&D on 4/5. Wound cultures + for Staph Aureus  - Back to OR 4/7 fror debridement and irrigation of driveline + packing  -  Plan further wound debridement and repacking tomorrow  - ID recommends PICC placement and 6 weeks of IV cefazolin followed by long-term suppressive oral cephalexin. - on PCA pump for pain control.   2.Chronic systolic HF - EF 70% also has severe RV failure.  S/p HM-3 LVAD on 09/06/17 - Volume status ok   3. HM3 LVAD 09/06/2017. - VAD interrogated personally. Parameters stable. - LDH 307 - INR 4.5 ->  5.0 -> 2.6->1.7->1.8. Has gotten 2 doses vit k (2.5, 5.0) - per Dr. Donata Clay, ok to resume low dose coumadin, 2.5 mg, tonight w/o heparin - Off ASA with PUD.   4. Essential HTN - MAPs ok, 70s-80s - Continue hydralazine and sildenafil   5. AKI - baseline SCr ~1.2.  - peaked at 1.55 this admit.  Stable 1.0-1.2 now  6. OSA - sleep study with very severe OSA (AHI 69/hr) - non-compliant CPAP  7. H/o GI bleed  - 08/2018 EGD completed and showed gastritis and nonbleeding duodenal ulcers, colonoscopy with 5 polyps removed. Protonix was increased to BID.  - No recent GI bleeding - hgb stable 9-10  - He will stay off aspirin for now.   8. Tobacco Abuse  - says he quit recently  9. Severe protein calorie malnutrition  - nutrition seeing  10. Hyponatremia - Na 128.  - mentating ok. Monitor  - may need tolvaptan if worsens   Length of Stay: [address], PA-C  08/23/2019, 10:41 AM  Advanced Heart Failure Team Pager [phone] (M-F; 7a - 4p)  Please contact [facility] for night-coverage after hours (4p -7a ) and weekends on [email or web address]  Patient seen and examined with the  above-signed Advanced Practice Provider and/or Housestaff. I personally reviewed laboratory data, imaging studies and relevant notes. I independently examined the patient and formulated the important aspects of the plan. I have edited the note to reflect any of my changes or salient points. I have personally discussed the plan with the patient and/or family.  Still with pain at wound site. Using PCA. Now afebrile. WBC coming down and ancef and rifampin. Mildly SOB. Weight up 9 pounds.   General:  Sitting in chair grunting HEENT: normal  Neck: supple. JVP to jaw Carotids 2+ bilat; no bruits. No lymphadenopathy or thryomegaly appreciated. Cor: LVAD hum.  Lungs: decreased at bases  Abdomen: obese soft, + tender, + distended. Large dressing in place  No hepatosplenomegaly. No bruits or masses. Good bowel sounds. Driveline site clean. Anchor in place.  Extremities: no cyanosis, clubbing, rash. Warm no  1-2+ edema  Neuro: alert & oriented x 3. No focal deficits. Moves all 4 without problem   Has deep Dl wound infection. Now s/p I&D. Cx + MSSA. On ancef and rifampin. Back to OR tomorrow. No heparin. Continue warfarin. Discussed dosing with PharmD personally. VAD interrogated personally. Parameters stable. Volume status up. Give IV lasix tonight and 1u RBCs.   Arvilla Meres, MD  7:41 PM

## 2019-08-24 ENCOUNTER — Encounter (HOSPITAL_COMMUNITY): Payer: Self-pay | Admitting: Internal Medicine

## 2019-08-24 ENCOUNTER — Encounter (HOSPITAL_COMMUNITY)
Admission: EM | Disposition: A | Discharge status: Home Health | Payer: Self-pay | Source: Ambulatory Visit | Attending: Internal Medicine

## 2019-08-24 ENCOUNTER — Inpatient Hospital Stay (HOSPITAL_COMMUNITY): Payer: Medicare Other | Admitting: Anesthesiology

## 2019-08-24 DIAGNOSIS — T827XXA Infection and inflammatory reaction due to other cardiac and vascular devices, implants and grafts, initial encounter: Secondary | ICD-10-CM | POA: Diagnosis not present

## 2019-08-24 DIAGNOSIS — I9789 Other postprocedural complications and disorders of the circulatory system, not elsewhere classified: Secondary | ICD-10-CM

## 2019-08-24 DIAGNOSIS — Z20822 Contact with and (suspected) exposure to covid-19: Secondary | ICD-10-CM | POA: Diagnosis not present

## 2019-08-24 DIAGNOSIS — A4101 Sepsis due to Methicillin susceptible Staphylococcus aureus: Secondary | ICD-10-CM | POA: Diagnosis not present

## 2019-08-24 DIAGNOSIS — I5023 Acute on chronic systolic (congestive) heart failure: Secondary | ICD-10-CM | POA: Diagnosis not present

## 2019-08-24 HISTORY — PX: APPLICATION OF A-CELL OF CHEST/ABDOMEN: SHX6302

## 2019-08-24 HISTORY — PX: APPLICATION OF WOUND VAC: SHX5189

## 2019-08-24 LAB — BASIC METABOLIC PANEL
Anion gap: 12 (ref 5–15)
BUN: 20 mg/dL (ref 6–20)
CO2: 27 mmol/L (ref 22–32)
Calcium: 9 mg/dL (ref 8.9–10.3)
Chloride: 94 mmol/L — ABNORMAL LOW (ref 98–111)
Creatinine, Ser: 0.96 mg/dL (ref 0.61–1.24)
GFR calc Af Amer: >60 mL/min (ref >60)
GFR calc non Af Amer: >60 mL/min (ref >60)
Glucose, Bld: 100 mg/dL — ABNORMAL HIGH (ref 70–99)
Potassium: 4.7 mmol/L (ref 3.5–5.1)
Sodium: 133 mmol/L — ABNORMAL LOW (ref 135–145)

## 2019-08-24 LAB — CBC
HCT: 31.9 % — ABNORMAL LOW (ref 39.0–52.0)
Hemoglobin: 9.3 g/dL — ABNORMAL LOW (ref 13.0–17.0)
MCH: 28.7 pg (ref 26.0–34.0)
MCHC: 29.2 g/dL — ABNORMAL LOW (ref 30.0–36.0)
MCV: 98.5 fL (ref 80.0–100.0)
Platelets: 303 10*3/uL (ref 150–400)
RBC: 3.24 MIL/uL — ABNORMAL LOW (ref 4.22–5.81)
RDW: 18.5 % — ABNORMAL HIGH (ref 11.5–15.5)
WBC: 13 10*3/uL — ABNORMAL HIGH (ref 4.0–10.5)
nRBC: 1.5 % — ABNORMAL HIGH (ref 0.0–0.2)

## 2019-08-24 LAB — BPAM RBC
Blood Product Expiration Date: 202104222359
Blood Product Expiration Date: 202104222359
Blood Product Expiration Date: 202105012359
Blood Product Expiration Date: 202105012359
ISSUE DATE / TIME: 202104051422
ISSUE DATE / TIME: 202104051422
Unit Type and Rh: 5100
Unit Type and Rh: 5100
Unit Type and Rh: 7300
Unit Type and Rh: 7300

## 2019-08-24 LAB — TYPE AND SCREEN
ABO/RH(D): B POS
Antibody Screen: POSITIVE
Donor AG Type: NEGATIVE
Donor AG Type: NEGATIVE
Donor AG Type: NEGATIVE
Donor AG Type: NEGATIVE
PT AG Type: NEGATIVE
Unit division: 0
Unit division: 0
Unit division: 0
Unit division: 0

## 2019-08-24 LAB — ACID FAST SMEAR (AFB, MYCOBACTERIA): Acid Fast Smear: NEGATIVE

## 2019-08-24 LAB — PROTIME-INR
INR: 1.7 — ABNORMAL HIGH (ref 0.8–1.2)
Prothrombin Time: 20 seconds — ABNORMAL HIGH (ref 11.4–15.2)

## 2019-08-24 LAB — COOXEMETRY PANEL
Carboxyhemoglobin: 2.1 % — ABNORMAL HIGH (ref 0.5–1.5)
Methemoglobin: 0.9 % (ref 0.0–1.5)
O2 Saturation: 74 %
Total hemoglobin: 9.5 g/dL — ABNORMAL LOW (ref 12.0–16.0)

## 2019-08-24 LAB — LACTATE DEHYDROGENASE: LDH: 336 U/L — ABNORMAL HIGH (ref 98–192)

## 2019-08-24 SURGERY — APPLICATION, WOUND VAC
Anesthesia: General | Site: Chest

## 2019-08-24 MED ORDER — MIDAZOLAM HCL 5 MG/5ML IJ SOLN
INTRAMUSCULAR | Status: DC | PRN
  Administered 2019-08-24: 2 mg via INTRAVENOUS

## 2019-08-24 MED ORDER — ROCURONIUM BROMIDE 10 MG/ML (PF) SYRINGE
PREFILLED_SYRINGE | INTRAVENOUS | Status: DC | PRN
  Administered 2019-08-24: 50 mg via INTRAVENOUS
  Administered 2019-08-24 (×2): 10 mg via INTRAVENOUS

## 2019-08-24 MED ORDER — PROPOFOL 10 MG/ML IV BOLUS
INTRAVENOUS | Status: DC | PRN
  Administered 2019-08-24: 80 mg via INTRAVENOUS

## 2019-08-24 MED ORDER — SUGAMMADEX SODIUM 200 MG/2ML IV SOLN
INTRAVENOUS | Status: DC | PRN
  Administered 2019-08-24: 200 mg via INTRAVENOUS

## 2019-08-24 MED ORDER — ONDANSETRON HCL 4 MG/2ML IJ SOLN
INTRAMUSCULAR | Status: AC
  Filled 2019-08-24: qty 2

## 2019-08-24 MED ORDER — FENTANYL CITRATE (PF) 250 MCG/5ML IJ SOLN
INTRAMUSCULAR | Status: AC
  Filled 2019-08-24: qty 5

## 2019-08-24 MED ORDER — PROPOFOL 10 MG/ML IV BOLUS
INTRAVENOUS | Status: AC
  Filled 2019-08-24: qty 20

## 2019-08-24 MED ORDER — ROCURONIUM BROMIDE 10 MG/ML (PF) SYRINGE
PREFILLED_SYRINGE | INTRAVENOUS | Status: AC
  Filled 2019-08-24: qty 10

## 2019-08-24 MED ORDER — LACTATED RINGERS IV SOLN: INTRAVENOUS | Status: DC

## 2019-08-24 MED ORDER — WARFARIN SODIUM 3 MG PO TABS
3.0000 mg | ORAL_TABLET | Freq: Once | ORAL | Status: AC
  Administered 2019-08-24: 17:00:00 3 mg via ORAL
  Filled 2019-08-24: qty 1

## 2019-08-24 MED ORDER — DEXAMETHASONE SODIUM PHOSPHATE 10 MG/ML IJ SOLN
INTRAMUSCULAR | Status: AC
  Filled 2019-08-24: qty 1

## 2019-08-24 MED ORDER — PHENYLEPHRINE HCL (PRESSORS) 10 MG/ML IV SOLN
INTRAVENOUS | Status: AC
  Filled 2019-08-24: qty 1

## 2019-08-24 MED ORDER — VANCOMYCIN HCL 1000 MG IV SOLR
INTRAVENOUS | Status: DC | PRN
  Administered 2019-08-24: 1000 mg

## 2019-08-24 MED ORDER — VANCOMYCIN HCL 1000 MG IV SOLR
INTRAVENOUS | Status: AC
  Filled 2019-08-24: qty 1000

## 2019-08-24 MED ORDER — SODIUM CHLORIDE 0.9 % IR SOLN
Status: DC | PRN
  Administered 2019-08-24: 1000 mL

## 2019-08-24 MED ORDER — PHENYLEPHRINE 40 MCG/ML (10ML) SYRINGE FOR IV PUSH (FOR BLOOD PRESSURE SUPPORT)
PREFILLED_SYRINGE | INTRAVENOUS | Status: AC
  Filled 2019-08-24: qty 10

## 2019-08-24 MED ORDER — CEFAZOLIN SODIUM-DEXTROSE 2-3 GM-%(50ML) IV SOLR
INTRAVENOUS | Status: DC | PRN
  Administered 2019-08-24: 2 g via INTRAVENOUS

## 2019-08-24 MED ORDER — PHENYLEPHRINE 40 MCG/ML (10ML) SYRINGE FOR IV PUSH (FOR BLOOD PRESSURE SUPPORT)
PREFILLED_SYRINGE | INTRAVENOUS | Status: DC | PRN
  Administered 2019-08-24: 80 ug via INTRAVENOUS
  Administered 2019-08-24: 120 ug via INTRAVENOUS
  Administered 2019-08-24: 40 ug via INTRAVENOUS

## 2019-08-24 MED ORDER — ALBUMIN HUMAN 5 % IV SOLN: INTRAVENOUS | Status: DC | PRN

## 2019-08-24 MED ORDER — MIDAZOLAM HCL 2 MG/2ML IJ SOLN
INTRAMUSCULAR | Status: AC
  Filled 2019-08-24: qty 2

## 2019-08-24 MED ORDER — PHENYLEPHRINE HCL-NACL 10-0.9 MG/250ML-% IV SOLN
INTRAVENOUS | Status: DC | PRN
  Administered 2019-08-24: 50 ug/min via INTRAVENOUS

## 2019-08-24 MED ORDER — DEXAMETHASONE SODIUM PHOSPHATE 10 MG/ML IJ SOLN
INTRAMUSCULAR | Status: DC | PRN
  Administered 2019-08-24: 4 mg via INTRAVENOUS

## 2019-08-24 MED ORDER — ONDANSETRON HCL 4 MG/2ML IJ SOLN
INTRAMUSCULAR | Status: DC | PRN
  Administered 2019-08-24: 4 mg via INTRAVENOUS

## 2019-08-24 MED ORDER — FENTANYL CITRATE (PF) 100 MCG/2ML IJ SOLN
INTRAMUSCULAR | Status: DC | PRN
  Administered 2019-08-24: 100 ug via INTRAVENOUS
  Administered 2019-08-24: 50 ug via INTRAVENOUS
  Administered 2019-08-24: 100 ug via INTRAVENOUS

## 2019-08-24 MED ORDER — HEMOSTATIC AGENTS (NO CHARGE) OPTIME
TOPICAL | Status: DC | PRN
  Administered 2019-08-24: 1 via TOPICAL

## 2019-08-24 SURGICAL SUPPLY — 68 items
ANCHOR CATH FOLEY SECURE (MISCELLANEOUS) ×2 IMPLANT
APL SKNCLS STERI-STRIP NONHPOA (GAUZE/BANDAGES/DRESSINGS)
APL SWBSTK 6 STRL LF DISP (MISCELLANEOUS) ×1
APPLICATOR COTTON TIP 6 STRL (MISCELLANEOUS) IMPLANT
APPLICATOR COTTON TIP 6IN STRL (MISCELLANEOUS) ×3
BENZOIN TINCTURE PRP APPL 2/3 (GAUZE/BANDAGES/DRESSINGS) IMPLANT
BLADE CLIPPER SURG (BLADE) ×1 IMPLANT
BLADE SURG 10 STRL SS (BLADE) IMPLANT
BLADE SURG 15 STRL LF DISP TIS (BLADE) IMPLANT
BLADE SURG 15 STRL SS (BLADE)
BNDG GAUZE ELAST 4 BULKY (GAUZE/BANDAGES/DRESSINGS) IMPLANT
CANISTER SUCT 3000ML PPV (MISCELLANEOUS) ×3 IMPLANT
CANISTER WOUND CARE 500ML ATS (WOUND CARE) ×3 IMPLANT
CLIP VESOCCLUDE SM WIDE 24/CT (CLIP) IMPLANT
CNTNR URN SCR LID CUP LEK RST (MISCELLANEOUS) IMPLANT
CONT SPEC 4OZ STRL OR WHT (MISCELLANEOUS)
COVER SURGICAL LIGHT HANDLE (MISCELLANEOUS) ×2 IMPLANT
DRAPE INCISE IOBAN 66X45 STRL (DRAPES) ×2 IMPLANT
DRAPE LAPAROSCOPIC ABDOMINAL (DRAPES) ×3 IMPLANT
DRAPE SLUSH/WARMER DISC (DRAPES) IMPLANT
DRSG CUTIMED SORBACT 7X9 (GAUZE/BANDAGES/DRESSINGS) ×2 IMPLANT
DRSG PAD ABDOMINAL 8X10 ST (GAUZE/BANDAGES/DRESSINGS) IMPLANT
DRSG VAC ATS LRG SENSATRAC (GAUZE/BANDAGES/DRESSINGS) ×1 IMPLANT
DRSG VAC ATS MED SENSATRAC (GAUZE/BANDAGES/DRESSINGS) ×3 IMPLANT
DRSG VAC ATS SM SENSATRAC (GAUZE/BANDAGES/DRESSINGS) ×1 IMPLANT
ELECT BLADE 4.0 EZ CLEAN MEGAD (MISCELLANEOUS) ×3
ELECT REM PT RETURN 9FT ADLT (ELECTROSURGICAL) ×3
ELECTRODE BLDE 4.0 EZ CLN MEGD (MISCELLANEOUS) IMPLANT
ELECTRODE REM PT RTRN 9FT ADLT (ELECTROSURGICAL) ×1 IMPLANT
GAUZE SPONGE 4X4 12PLY STRL (GAUZE/BANDAGES/DRESSINGS) IMPLANT
GAUZE XEROFORM 5X9 LF (GAUZE/BANDAGES/DRESSINGS) IMPLANT
GLOVE BIO SURGEON STRL SZ7.5 (GLOVE) ×6 IMPLANT
GLOVE BIOGEL PI IND STRL 6 (GLOVE) IMPLANT
GLOVE BIOGEL PI INDICATOR 6 (GLOVE) ×2
GOWN STRL REUS W/ TWL LRG LVL3 (GOWN DISPOSABLE) ×2 IMPLANT
GOWN STRL REUS W/TWL LRG LVL3 (GOWN DISPOSABLE) ×9
HANDPIECE INTERPULSE COAX TIP (DISPOSABLE)
HEMOSTAT POWDER SURGIFOAM 1G (HEMOSTASIS) IMPLANT
HEMOSTAT SURGICEL 2X14 (HEMOSTASIS) IMPLANT
KIT BASIN OR (CUSTOM PROCEDURE TRAY) ×3 IMPLANT
KIT SUCTION CATH 14FR (SUCTIONS) IMPLANT
KIT TURNOVER KIT B (KITS) ×3 IMPLANT
MATRIX WOUND 3-LAYER 10X15 (Tissue) ×1 IMPLANT
MICROMATRIX 1000MG (Tissue) ×12 IMPLANT
MICROMATRIX 500MG (Tissue) ×12 IMPLANT
NS IRRIG 1000ML POUR BTL (IV SOLUTION) ×11 IMPLANT
PACK GENERAL/GYN (CUSTOM PROCEDURE TRAY) ×3 IMPLANT
PAD ARMBOARD 7.5X6 YLW CONV (MISCELLANEOUS) ×6 IMPLANT
SET HNDPC FAN SPRY TIP SCT (DISPOSABLE) ×1 IMPLANT
SOLUTION PARTIC MCRMTRX 1000MG (Tissue) IMPLANT
SOLUTION PARTIC MCRMTRX 500MG (Tissue) IMPLANT
SPONGE LAP 4X18 RFD (DISPOSABLE) ×2 IMPLANT
STAPLER VISISTAT 35W (STAPLE) IMPLANT
SURGILUBE 2OZ TUBE FLIPTOP (MISCELLANEOUS) ×2 IMPLANT
SUT ETHILON 3 0 FSL (SUTURE) IMPLANT
SUT VIC AB 1 CTX 36 (SUTURE)
SUT VIC AB 1 CTX36XBRD ANBCTR (SUTURE) IMPLANT
SUT VIC AB 2-0 CTX 27 (SUTURE) IMPLANT
SUT VIC AB 3-0 SH 8-18 (SUTURE) ×2 IMPLANT
SUT VIC AB 3-0 X1 27 (SUTURE) IMPLANT
SWAB COLLECTION DEVICE MRSA (MISCELLANEOUS) IMPLANT
SWAB CULTURE ESWAB REG 1ML (MISCELLANEOUS) IMPLANT
SYR 5ML LL (SYRINGE) ×2 IMPLANT
TOWEL GREEN STERILE (TOWEL DISPOSABLE) ×3 IMPLANT
TOWEL GREEN STERILE FF (TOWEL DISPOSABLE) ×1 IMPLANT
TRAY FOLEY MTR SLVR 16FR STAT (SET/KITS/TRAYS/PACK) IMPLANT
WATER STERILE IRR 1000ML POUR (IV SOLUTION) ×1 IMPLANT
WOUND MATRIX 3-LAYER 10X15 (Tissue) ×1 IMPLANT

## 2019-08-24 NOTE — Anesthesia Postprocedure Evaluation (Signed)
Anesthesia Post Note  Patient: Jonathan Wood  Procedure(s) Performed: Irrigation and Debridement with WOUND VAC APPLICATION (N/A Chest) Application Of A-Cell Of Chest/Abdomen (N/A Chest)     Patient location during evaluation: PACU Anesthesia Type: General Level of consciousness: awake and alert Pain management: pain level controlled Vital Signs Assessment: post-procedure vital signs reviewed and stable Respiratory status: spontaneous breathing, nonlabored ventilation, respiratory function stable and patient connected to nasal cannula oxygen Cardiovascular status: blood pressure returned to baseline and stable Postop Assessment: no apparent nausea or vomiting Anesthetic complications: no    Last Vitals:  Vitals:   08/24/19 1448 08/24/19 1532  BP:    Pulse: (!) 111   Resp: 19   Temp:  36.7 C  SpO2: 97%     Last Pain:  Vitals:   08/24/19 1334  TempSrc:   PainSc: 6                  Nicolena Schurman COKER

## 2019-08-24 NOTE — Progress Notes (Signed)
ANTICOAGULATION CONSULT NOTE - Follow Up Consult  Pharmacy Consult for warfarin Indication: LVAD  No Known Allergies  Patient Measurements: Height: 6\' 2"  (188 cm) Weight: 106.9 kg (235 lb 10.8 oz) IBW/kg (Calculated) : 82.2  Vital Signs: Temp: 97 F (36.1 C) (04/09 1255) BP: 109/97 (04/09 1447) Pulse Rate: 111 (04/09 1448)  Labs: Recent Labs    08/22/19 0327 08/22/19 0327 08/23/19 0426 08/24/19 0359  HGB 9.3*   < > 9.0* 9.3*  HCT 31.0*  --  31.0* 31.9*  PLT 305  --  299 303  LABPROT 19.6*  --  21.1* 20.0*  INR 1.7*  --  1.8* 1.7*  CREATININE 1.04  --  1.12 0.96   < > = values in this interval not displayed.    Estimated Creatinine Clearance: 125.3 mL/min (by C-G formula based on SCr of 0.96 mg/dL).   Medical History: Past Medical History:  Diagnosis Date  . Asthma   . CHF (congestive heart failure) (HCC)    a. 09/2016: EF 20-25% with cath showing normal cors  . GERD (gastroesophageal reflux disease)   . History of hiatal hernia   . OSA on CPAP 09/06/2018   Severe OSA with AHI 68/hr on CPAP at 12cm H2O    Assessment: 48yom with HF s/p LVAD implant HM3 place 4/19 admitted with drive line infection.    Warfarin PTA 7.5mg  MW and 5mg  all other days admit INR elevated at 5 - probably acute infection related supra-sensitive.    INR today came back at 1.7 slightly subtherapeutic. Hgb stable, plt 200s stable. LDH trending up slightly 200>300s watch No s/sx of bleeding. Off rifampin given on warfarin now. No heparin infusion at this time per MD.  Goal of Therapy:  INR 2-2.5 Monitor platelets by anticoagulation protocol: Yes   Plan:  Holding heparin infusion for now Repeat warfarin 3mg  today - may need increase dose  Follow up plan post wound debridement  Daily INR  5/19 Pharm.D. CPP, BCPS Clinical Pharmacist [phone] 08/24/2019 3:34 PM    Please check AMION for all [facility] phone numbers After 10:00 PM, call Main Pharmacy [phone]

## 2019-08-24 NOTE — Progress Notes (Signed)
LVAD Coordinator Rounding Note:  Admitted 08/16/19 by Dr. [name] due to VAD drive line infection/pneumonia/sepsis  HM III LVAD implanted on 09/06/17 by Dr. Cyndia Bent under Destination Therapy criteria.  Met patient in short stay. Undergoing debridement, irrigation, and wound vac placement of abdominal wound today with Dr Prescott Gum. States his pain is "ok" this morning and that he "justs wants to get this over with."   Vital signs: Temp: 97.9 HR: 108 Doppler Pressure: not done Automatic BP: 106/94 (100) O2 Sat: 100% on 2L Wt: 227.5>231>235>233.9>236.3>236.5>235.6  lbs   LVAD interrogation reveals:  Speed: 5600 Flow: 5.0 Power:  4.4w PI: 3.1 Alarms: none Events: none Hematocrit: 31  Fixed speed: 5600 Low speed limit: 5300  Drive Line: CDI. Twice weekly dressing changes using daily kit w/siliver strip by bedside nurse. Will change in OR today. Next dressing change 08/27/19.   Sternal wound: Ioban and packing clean, dry, intact. Will change in OR per Dr Prescott Gum.   Labs:  LDH trend: 227>230>271>245>233>307>336  INR trend: 4.5>5.0>1.7>1.7>1.7>1.8>1.7  WBC: 27.3>26.2>24>34.6>25.8>20.8>16.9>13.0  Alk Phos: 138> 177  AST: 53> 154  ALT: 17> 35  Infection: - blood cultures 08/16/19>>staphylococcus aureus - blood cultures 08/18/19>> NGTD - Drive line tunnel / sternum 08/20/19>> abundant WBC, gram + cocci, culture- few staph aureus - Drive line AFB 3/1/594>> pending - Drive line/Sternal Fungus culture 08/20/19>> negative   Anticoagulation Plan: -INR Goal: 2.0 - 2.5  -ASA Dose: none  Adverse Events: - driveline cx 58/5/92>> staph aureus - Admitted for sternal/drive line infection. OR for debridement 08/20/19- + staph aureus wound cultures   Plan/Recommendations:  1. Call VAD pager if any VAD equipment or drive line issues. 2. Twice weekly dressing changes by bedside nurse.  3. VAD coordinator will accompany patient to OR today.   Emerson Monte RN [place] Coordinator   Office: [phone]  24/7 Pager: [phone]

## 2019-08-24 NOTE — Transfer of Care (Signed)
Immediate Anesthesia Transfer of Care Note  Patient: Jonathan Wood  Procedure(s) Performed: Irrigation and Debridement with WOUND VAC APPLICATION (N/A Chest) Application Of A-Cell Of Chest/Abdomen (N/A Chest)  Patient Location: PACU  Anesthesia Type:General  Level of Consciousness: drowsy and patient cooperative  Airway & Oxygen Therapy: Patient Spontanous Breathing and Patient connected to nasal cannula oxygen  Post-op Assessment: Report given to RN and Post -op Vital signs reviewed and stable  Post vital signs: Reviewed and stable  Last Vitals:  Vitals Value Taken Time  BP 127/114 08/24/19 1212  Temp    Pulse 123 08/24/19 1216  Resp 17 08/24/19 1216  SpO2 98 % 08/24/19 1216  Vitals shown include unvalidated device data.  Last Pain:  Vitals:   08/24/19 0409  TempSrc:   PainSc: 0-No pain      Patients Stated Pain Goal: 0 (08/23/19 0800)  Complications: No apparent anesthesia complications

## 2019-08-24 NOTE — Anesthesia Procedure Notes (Signed)
Procedure Name: Intubation Date/Time: 08/24/2019 10:47 AM Performed by: Moshe Salisbury, CRNA Pre-anesthesia Checklist: Patient identified, Emergency Drugs available, Suction available and Patient being monitored Patient Re-evaluated:Patient Re-evaluated prior to induction Oxygen Delivery Method: Circle System Utilized Preoxygenation: Pre-oxygenation with 100% oxygen Induction Type: IV induction Ventilation: Mask ventilation without difficulty Laryngoscope Size: Mac and 4 Grade View: Grade II Tube type: Oral Tube size: 8.0 mm Number of attempts: 1 Airway Equipment and Method: Stylet Placement Confirmation: ETT inserted through vocal cords under direct vision,  positive ETCO2 and breath sounds checked- equal and bilateral Secured at: 21 cm Tube secured with: Tape Dental Injury: Teeth and Oropharynx as per pre-operative assessment

## 2019-08-24 NOTE — Brief Op Note (Addendum)
08/24/2019  11:58 AM  PATIENT:  Jonathan Wood  49 y.o. male  PRE-OPERATIVE DIAGNOSIS:  VAD SITE INFECTION  POST-OPERATIVE DIAGNOSIS:  VAD SITE INFECTION  PROCEDURE:  Procedure(s): Irrigation and Debridement with WOUND VAC APPLICATION (N/A)  Application of ACell matrix powder and sheet  wound of proximal drive line tunnel 14 cm long, 5 cm wide 5 cm deep SURGEON:  Surgeon(s) and Role:    Kerin Perna, MD - Primary  PHYSICIAN ASSISTANT:   ASSISTANTS: none   ANESTHESIA:   general  EBL: 5 ml   BLOOD ADMINISTERED:none  DRAINS: none   LOCAL MEDICATIONS USED:  NONE  SPECIMEN:  No Specimen  DISPOSITION OF SPECIMEN:  N/A  COUNTS:  YES  TOURNIQUET:  * No tourniquets in log *  DICTATION: .Dragon Dictation  PLAN OF CARE: return to ICU  PATIENT DISPOSITION:  PACU - hemodynamically stable.   Delay start of Pharmacological VTE agent (>24hrs) due to surgical blood loss or risk of bleeding: yes

## 2019-08-24 NOTE — Plan of Care (Signed)
  Problem: Clinical Measurements: Goal: Will remain free from infection Outcome: Progressing   Problem: Coping: Goal: Level of anxiety will decrease Outcome: Progressing   Problem: Skin Integrity: Goal: Risk for impaired skin integrity will decrease Outcome: Progressing   

## 2019-08-24 NOTE — Anesthesia Preprocedure Evaluation (Addendum)
Anesthesia Evaluation  Patient identified by MRN, date of birth, ID band Patient awake    Reviewed: Allergy & Precautions, NPO status , Patient's Chart, lab work & pertinent test results  Airway Mallampati: II  TM Distance: >3 FB Neck ROM: Full    Dental  (+) Dental Advisory Given   Pulmonary Current Smoker,     + decreased breath sounds      Cardiovascular hypertension, +CHF   Rhythm:Regular Rate:Normal  Mechanical hum from LVAD   Neuro/Psych    GI/Hepatic   Endo/Other    Renal/GU      Musculoskeletal   Abdominal   Peds  Hematology   Anesthesia Other Findings   Reproductive/Obstetrics                            Anesthesia Physical Anesthesia Plan  ASA: IV  Anesthesia Plan: General   Post-op Pain Management:    Induction: Intravenous  PONV Risk Score and Plan: 2 and Ondansetron and Dexamethasone  Airway Management Planned: Oral ETT  Additional Equipment:   Intra-op Plan:   Post-operative Plan: Extubation in OR  Informed Consent: I have reviewed the patients History and Physical, chart, labs and discussed the procedure including the risks, benefits and alternatives for the proposed anesthesia with the patient or authorized representative who has indicated his/her understanding and acceptance.       Plan Discussed with: CRNA and Anesthesiologist  Anesthesia Plan Comments:        Anesthesia Quick Evaluation

## 2019-08-24 NOTE — Progress Notes (Addendum)
VAD Coordinator Procedure Note:   VAD Coordinator met patient in short stay / OR. Pt undergoing debridement, irrigation, and placement of wound vac of abdominal incision per Dr. Prescott Gum. Hemodynamics and VAD parameters monitored by myself and anesthesia throughout the procedure. Blood pressures were obtained with automatic cuff on left arm and correlated with Doppler. BP maintained with Neo drip and Albumin.     Time: Doppler Auto  BP Flow PI Power Speed  Pre-procedure:  0930  106/94 (100) 5.0 3.1 4.4 5600   1000  113/95 (102) 5.2 3.2 4.4 5600   1040  107/83 (92) 5.1 2.8 4.3 5600  Sedation Induction: 1045  71/52 (58) 4.1 2.3 4.3 5600   1050  113/57 (74) 5.0 2.9 4.2 5600   1100  103/77 (86) 5.3 2.2 4.3 5600   1115  82/70 (76) 5.2 3.3 4.3 5600   1120   64/38 (41) 5.1 3.3 4.3 5600   1125  (43) 5.1 3.4 4.2 5600   1130  82/71 (71) 5.2 3.3 4.2 5600   1145  105/87 (94) 5.2 3.3 4.2 5600   1150  101/80 (88) 5.2 3.2 4.2 5600           Recovery Area: 1215  127/114 (121) 5.0 3.2 4.4 5600   1230  115/92 (98) 4.9 3.5 4.4 5600   1255  119/99 (105) 4.8 3.6 4.3 5600                      Patient Disposition: Patient tolerated the procedure well. VAD Coordinator accompanied and remained with patient in recovery area.   Existing VAD dressing removed and site care performed using sterile technique. Drive line exit site cleaned with Chlora prep applicators x 2, allowed to dry, and gauze dressing with silver strip re-applied. Exit site partially incorporated, the velour is fully implanted at exit site. No redness, tenderness, drainage, foul odor or rash noted. Drive line anchor re-applied.   Transported back to 2H25.   Emerson Monte RN [place] Coordinator  Office: [phone]  24/7 Pager: [phone]

## 2019-08-24 NOTE — Progress Notes (Signed)
Dilaudid PCA 75ml wasted in stericycle.  Witnessed by Gaspar Garbe, RN

## 2019-08-24 NOTE — Progress Notes (Signed)
Pre Procedure note for inpatients:   Jonathan Wood has been scheduled for Procedure(s): Debridement, Irrigation and Packing of Abdominal Incision. (N/A) today. The various methods of treatment have been discussed with the patient. After consideration of the risks, benefits and treatment options the patient has consented to the planned procedure.   The patient has been seen and labs reviewed. There are no changes in the patient's condition to prevent proceeding with the planned procedure today.  Recent labs:  Lab Results  Component Value Date   WBC 13.0 (H) 08/24/2019   HGB 9.3 (L) 08/24/2019   HCT 31.9 (L) 08/24/2019   PLT 303 08/24/2019   GLUCOSE 100 (H) 08/24/2019   CHOL 111 08/30/2017   TRIG 38 08/30/2017   HDL 33 (L) 08/30/2017   LDLCALC 70 08/30/2017   ALT 35 08/23/2019   AST 134 (H) 08/23/2019   NA 133 (L) 08/24/2019   K 4.7 08/24/2019   CL 94 (L) 08/24/2019   CREATININE 0.96 08/24/2019   BUN 20 08/24/2019   CO2 27 08/24/2019   TSH 0.209 (L) 08/13/2019   INR 1.7 (H) 08/24/2019   HGBA1C 5.2 08/30/2017    Mikey Bussing, MD 08/24/2019 7:29 AM

## 2019-08-24 NOTE — Progress Notes (Addendum)
Advanced Heart Failure Rounding Note  PCP-Cardiologist: [name], MD    Patient Profile   49 y.o. 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  Subjective:    - 4/5 S/p I&D w/ excisional debridement of subxiphoid abscess. Cultures + Staph Aureus  - 4/7, Returned to OR for debridement, Irrigation and Packing of Abdominal Incision - 4/9 S/P I&D abd wound with Acell and VAC application.   Blood cultures positive for MSSA.  On cefazolin and rifampin. ID following.   WBC peaked at 34>> 13  Complaining of abdominal pain.    VAD Interrogation: Speed:5600 Flow:5 Power:4  PI:3.1 , no PI events  CT of C/A/P 08/16/19 IMPRESSION: 1. New fluid collections along the efferent tubing from the ventricular cyst device to the ascending aorta. Findings are suspicious for infection given clinical presentation and elevated white blood cell count. 2. Stable fluid surrounding the line within the left upper quadrant anterior abdominal wall, with decreased adjacent fat stranding. Infection in this region cannot be excluded. 3. Stable cardiomegaly. 4. Stable emphysema. 5. Otherwise no acute intra-abdominal or intrapelvic process.  Objective:   Weight Range: 106.9 kg Body mass index is 30.26 kg/m.   Vital Signs:   Temp:  [97 F (36.1 C)-98.4 F (36.9 C)] 98 F (36.7 C) (04/09 1532) Pulse Rate:  [100-124] 111 (04/09 1448) Resp:  [12-29] 12 (04/09 1600) BP: (81-128)/(62-114) 109/97 (04/09 1447) SpO2:  [95 %-100 %] 100 % (04/09 1600) Weight:  [106.9 kg] 106.9 kg (04/09 0500) Last BM Date: 08/23/19  Weight change: Filed Weights   08/22/19 0500 08/23/19 0500 08/24/19 0500  Weight: 107.2 kg 107.3 kg 106.9 kg    Intake/Output:   Intake/Output Summary (Last 24 hours) at 08/24/2019 1701 Last data filed at 08/24/2019  1255 Gross per 24 hour  Intake 1699.97 ml  Output 1475 ml  Net 224.97 ml      Physical Exam   Physical Exam: GENERAL: In bed no distress.  HEENT: normal  NECK: Supple, JVP 6-7   2+ bilaterally, no bruits.  No lymphadenopathy or thyromegaly appreciated.   CARDIAC:  Mechanical heart sounds with LVAD hum present.  LUNGS:  Clear to auscultation bilaterally.  ABDOMEN:  Soft, round, nontender, positive bowel sounds x4.     LVAD exit site: Dressing intact.  EXTREMITIES:  Warm and dry, no cyanosis, clubbing, rash or edema. RUE PICC   NEUROLOGIC:  Alert and oriented x 4.  Gait steady.  No aphasia.  No dysarthria.  Affect pleasant.     Labs    CBC Recent Labs    08/23/19 0426 08/24/19 0359  WBC 16.9* 13.0*  HGB 9.0* 9.3*  HCT 31.0* 31.9*  MCV 96.6 98.5  PLT 299 683   Basic Metabolic Panel Recent Labs    08/23/19 0426 08/24/19 0359  NA 128* 133*  K 5.1 4.7  CL 93* 94*  CO2 24 27  GLUCOSE 96 100*  BUN 25* 20  CREATININE 1.12 0.96  CALCIUM 8.8* 9.0   Liver Function Tests Recent Labs    08/22/19 0327 08/23/19 0426  AST 53* 134*  ALT 17 35  ALKPHOS 138* 177*  BILITOT 1.5* 1.6*  PROT 7.7 7.6  ALBUMIN 2.0* 2.6*   No results for input(s): LIPASE, AMYLASE in the last 72 hours. Cardiac Enzymes No results for input(s): CKTOTAL, CKMB, CKMBINDEX, TROPONINI in  the last 72 hours.  BNP: BNP (last 3 results) Recent Labs    03/21/19 1415 04/23/19 1350  BNP 426.9* 709.6*    ProBNP (last 3 results) No results for input(s): PROBNP in the last 8760 hours.   D-Dimer No results for input(s): DDIMER in the last 72 hours. Hemoglobin A1C No results for input(s): HGBA1C in the last 72 hours. Fasting Lipid Panel No results for input(s): CHOL, HDL, LDLCALC, TRIG, CHOLHDL, LDLDIRECT in the last 72 hours. Thyroid Function Tests No results for input(s): TSH, T4TOTAL, T3FREE, THYROIDAB in the last 72 hours.  Invalid input(s): FREET3  Other results:   Imaging    No  results found.   Medications:     Scheduled Medications: . acetaminophen  1,000 mg Oral Q6H   Or  . acetaminophen (TYLENOL) oral liquid 160 mg/5 mL  1,000 mg Oral Q6H  . bisacodyl  10 mg Oral Daily  . Chlorhexidine Gluconate Cloth  6 each Topical Daily  . docusate sodium  100 mg Oral TID  . DULoxetine  30 mg Oral Daily  . feeding supplement (ENSURE ENLIVE)  237 mL Oral TID BM  . ferrous sulfate  325 mg Oral Q breakfast  . gabapentin  300 mg Oral TID  . hydrALAZINE  50 mg Oral TID  . HYDROmorphone   Intravenous Q4H  . linaclotide  145 mcg Oral QODAY  . mouth rinse  15 mL Mouth Rinse BID  . metoCLOPramide (REGLAN) injection  10 mg Intravenous Q6H  . multivitamin with minerals  1 tablet Oral Daily  . pantoprazole  40 mg Oral BID  . senna-docusate  1 tablet Oral QHS  . sildenafil  20 mg Oral TID  . sodium chloride flush  10-40 mL Intracatheter Q12H  . sodium chloride flush  10-40 mL Intracatheter Q12H  . sodium chloride flush  3 mL Intravenous Q12H  . warfarin  3 mg Oral ONCE-1600  . Warfarin - Pharmacist Dosing Inpatient   Does not apply q1600    Infusions: . sodium chloride 10 mL/hr at 08/24/19 0800  .  ceFAZolin (ANCEF) IV 2 g (08/24/19 1407)  . dextrose 5 % and 0.45% NaCl Stopped (08/22/19 0710)  . lactated ringers    . phenylephrine (NEO-SYNEPHRINE) Adult infusion      PRN Medications: sodium chloride, acetaminophen, albuterol, bisacodyl **OR** bisacodyl, diphenhydrAMINE **OR** diphenhydrAMINE, hydrOXYzine, magnesium hydroxide, naloxone **AND** sodium chloride flush, ondansetron (ZOFRAN) IV, ondansetron (ZOFRAN) IV, oxyCODONE, sodium chloride flush, sodium chloride flush, sodium chloride flush, traMADol    Assessment/Plan    1. Fever & leukocytosis -> MSSA bacteremia/ Subxiphoid Abscess  - bcx 2/2 MSSA - ID has changed antibiotics to cefazolin  - WBC 24 -> 29->34K-> 25->20->16-> 13K  - He is not a candidate for VAD exchange due to severe RV failure and  noncompliance.  - S/p I&D on 4/5. Wound cultures + for Staph Aureus  - Back to OR 4/7 and 4/9  fror debridement and irrigation of driveline + packing  - ID recommends PICC placement and 6 weeks of IV cefazolin followed by long-term suppressive oral cephalexin. - on PCA pump for pain control.   2.Chronic systolic HF - EF 46% also has severe RV failure.  S/p HM-3 LVAD on 09/06/17 - Volume status stable.   3. HM3 LVAD 09/06/2017. - VAD interrogated personally. Parameters stable. - LDH 336  - Initially high 5.0 -> Has gotten 2 doses vit k (2.5, 5.0) - INR 1.7  - Heparin per Dr Maren Beach - Off  ASA with PUD.   4. Essential HTN - MAPs 80-100s  - Continue hydralazine and sildenafil   5. AKI - baseline SCr ~1.2.  - peaked at 1.55 this admit.   - Improved now creatinine normalized.   6. OSA - sleep study with very severe OSA (AHI 69/hr) - non-compliant CPAP  7. H/o GI bleed  - 08/2018 EGD completed and showed gastritis and nonbleeding duodenal ulcers, colonoscopy with 5 polyps removed. Protonix was increased to BID.  - No recent GI bleeding - hgb 9.3   - He will stay off aspirin for now.   8. Tobacco Abuse  - says he quit recently  9. Severe protein calorie malnutrition  - nutrition seeing  10. Hyponatremia - Na improved to 133   - may need tolvaptan if worsens   Length of Stay: 8  Amy Clegg, NP  08/24/2019, 5:01 PM  Advanced Heart Failure Team Pager [phone] (M-F; 7a - 4p)  Please contact [facility] for night-coverage after hours (4p -7a ) and weekends on [email or web address] 5:01 PM   Patient seen and examined with the above-signed Advanced Practice Provider and/or Housestaff. I personally reviewed laboratory data, imaging studies and relevant notes. I independently examined the patient and formulated the important aspects of the plan. I have edited the note to reflect any of my changes or salient points. I have personally discussed the plan with the patient and/or  family.  He returned from the OR today after repeat wound debridement and wound vac placement. Remains on abx. Afebrile. Still having some pain at site and on PCA.   General:  Mildly uncomfortable HEENT: normal  Neck: supple. JVP 9-10.  Carotids 2+ bilat; no bruits. No lymphadenopathy or thryomegaly appreciated. Cor: LVAD hum.  Lungs: Clear. Abdomen: obese soft, +distended.  Wound vac in place in epigastric region. No hepatosplenomegaly. No bruits or masses. Good bowel sounds. Driveline site bandaged Extremities: no cyanosis, clubbing, rash. Warm no edema  Neuro: alert & oriented x 3. No focal deficits. Moves all 4 without problem   He is s/p repeat debridement of deep driveline infection/abscess. Wound vac now in place with good seal. Remains on abx regimen per ID. Now AF. INR 1.7 Discussed dosing with PharmD personally. VAD interrogated personally. Parameters stable. Can give more lasix as needed. Continue to load coumadin.   Arvilla Meres, MD  5:41 PM

## 2019-08-24 NOTE — Op Note (Signed)
NAME: Jonathan Wood, Jonathan Wood MEDICAL RECORD [id number] ACCOUNT [id number] DATE OF BIRTH:Sep 29, 1970 FACILITY: [facility] LOCATION: [facility] PHYSICIAN:Marquisha Nikolov VAN TRIGT III, MD  OPERATIVE REPORT  DATE OF PROCEDURE:  08/24/2019  OPERATIONS: 1.  Excisional sharp debridement of abdominal wound left ventricular assist device driveline tunnel. 2.  Antibiotic irrigation of wound. 3.  Application of ACell Matrix powder and sheet. 4.  Application of wound VAC drainage system.  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  ANESTHESIA:  General.  DESCRIPTION OF PROCEDURE:  The patient was brought from preop holding where informed consent was documented and final issues regarding the procedure were addressed with the patient.  The patient was placed supine on the operating table and general  anesthesia was induced.  He was intubated.  The VAD coordinator was present for the entire procedure to monitor the VAD equipment and to monitor the patient.    The previous abdominal dressing and packing were removed and the abdomen and chest were prepped and draped as a sterile field.  A proper time-out was performed.  The wound was examined.  It was in the epigastric area measuring 14 cm in length, 5 cm wide and 5 cm deep.  The bend relief surface of the outflow graft is visible.  The proximal power cord to the HeartMate 3 is partially visible as well.  The wound  itself was improved since last visit.  There was no purulence.  Some necrotic fat was sharply debrided.  There was a tunnel extending beneath the power cord in the anterior abdominal wall above the rectus sheath to the left upper quadrant.  There was  another smaller space in between the power cord and the outflow  graft.  The wound was irrigated with over a liter of antibiotic irrigation.  After sharp debridement, hemostasis was achieved.  We next applied ACell paste into the 2 tunneled areas,  followed by powder at the base of the wound.  Over that, a sheet of ACell was applied and secured to the subcutaneous fat with 3-0 Vicryl sutures.  Next, over that, a sheet of Sorbact was placed and secured to the skin.  Over the Sorbact, sterile  lubricant was placed, followed by the wound VAC and the wound VAC sheaths.  The wound VAC suction line was applied to the sponge and connected to the machine and there was good compression and no air leak.  The patient was then reversed from anesthesia  and returned to the recovery room in good condition.  VN/NUANCE  D:08/24/2019 T:08/24/2019 JOB:[id number]

## 2019-08-24 NOTE — Progress Notes (Signed)
LVAD coordinator Revonda Standard notified of pt being transported to short stay room 33 for procedure.

## 2019-08-25 LAB — AEROBIC/ANAEROBIC CULTURE W GRAM STAIN (SURGICAL/DEEP WOUND)

## 2019-08-25 LAB — PROTIME-INR
INR: 1.9 — ABNORMAL HIGH (ref 0.8–1.2)
Prothrombin Time: 21.8 seconds — ABNORMAL HIGH (ref 11.4–15.2)

## 2019-08-25 LAB — COOXEMETRY PANEL
Carboxyhemoglobin: 2 % — ABNORMAL HIGH (ref 0.5–1.5)
Methemoglobin: 1 % (ref 0.0–1.5)
O2 Saturation: 58.7 %
Total hemoglobin: 10.6 g/dL — ABNORMAL LOW (ref 12.0–16.0)

## 2019-08-25 LAB — BASIC METABOLIC PANEL
Anion gap: 11 (ref 5–15)
BUN: 21 mg/dL — ABNORMAL HIGH (ref 6–20)
CO2: 28 mmol/L (ref 22–32)
Calcium: 9.2 mg/dL (ref 8.9–10.3)
Chloride: 94 mmol/L — ABNORMAL LOW (ref 98–111)
Creatinine, Ser: 1.01 mg/dL (ref 0.61–1.24)
GFR calc Af Amer: >60 mL/min (ref >60)
GFR calc non Af Amer: >60 mL/min (ref >60)
Glucose, Bld: 102 mg/dL — ABNORMAL HIGH (ref 70–99)
Potassium: 5 mmol/L (ref 3.5–5.1)
Sodium: 133 mmol/L — ABNORMAL LOW (ref 135–145)

## 2019-08-25 LAB — CBC
HCT: 30.7 % — ABNORMAL LOW (ref 39.0–52.0)
Hemoglobin: 8.8 g/dL — ABNORMAL LOW (ref 13.0–17.0)
MCH: 28.7 pg (ref 26.0–34.0)
MCHC: 28.7 g/dL — ABNORMAL LOW (ref 30.0–36.0)
MCV: 100 fL (ref 80.0–100.0)
Platelets: 292 10*3/uL (ref 150–400)
RBC: 3.07 MIL/uL — ABNORMAL LOW (ref 4.22–5.81)
RDW: 18.6 % — ABNORMAL HIGH (ref 11.5–15.5)
WBC: 15.2 10*3/uL — ABNORMAL HIGH (ref 4.0–10.5)
nRBC: 1.6 % — ABNORMAL HIGH (ref 0.0–0.2)

## 2019-08-25 LAB — LACTATE DEHYDROGENASE: LDH: 306 U/L — ABNORMAL HIGH (ref 98–192)

## 2019-08-25 MED ORDER — TORSEMIDE 20 MG PO TABS
20.0000 mg | ORAL_TABLET | ORAL | Status: DC
  Administered 2019-08-25 – 2019-08-27 (×2): 20 mg via ORAL
  Filled 2019-08-25 (×3): qty 1

## 2019-08-25 MED ORDER — WARFARIN SODIUM 3 MG PO TABS
3.0000 mg | ORAL_TABLET | Freq: Once | ORAL | Status: AC
  Administered 2019-08-25: 16:00:00 3 mg via ORAL
  Filled 2019-08-25: qty 1

## 2019-08-25 MED ORDER — ALUM & MAG HYDROXIDE-SIMETH 200-200-20 MG/5ML PO SUSP
30.0000 mL | ORAL | Status: DC | PRN
  Administered 2019-08-25 – 2019-08-29 (×6): 30 mL via ORAL
  Filled 2019-08-25 (×6): qty 30

## 2019-08-25 NOTE — Progress Notes (Signed)
ANTICOAGULATION CONSULT NOTE - Follow Up Consult  Pharmacy Consult for warfarin Indication: LVAD  No Known Allergies  Patient Measurements: Height: 6\' 2"  (188 cm) Weight: 109.4 kg (241 lb 2.9 oz) IBW/kg (Calculated) : 82.2  Vital Signs: Temp: 98.2 F (36.8 C) (04/10 1130) Temp Source: Oral (04/10 1130) BP: 99/77 (04/10 0922) Pulse Rate: 109 (04/10 1000)  Labs: Recent Labs    08/23/19 0426 08/23/19 0426 08/24/19 0359 08/25/19 0336  HGB 9.0*   < > 9.3* 8.8*  HCT 31.0*  --  31.9* 30.7*  PLT 299  --  303 292  LABPROT 21.1*  --  20.0* 21.8*  INR 1.8*  --  1.7* 1.9*  CREATININE 1.12  --  0.96 1.01   < > = values in this interval not displayed.    Estimated Creatinine Clearance: 120.3 mL/min (by C-G formula based on SCr of 1.01 mg/dL).   Medical History: Past Medical History:  Diagnosis Date  . Asthma   . CHF (congestive heart failure) (HCC)    a. 09/2016: EF 20-25% with cath showing normal cors  . GERD (gastroesophageal reflux disease)   . History of hiatal hernia   . OSA on CPAP 09/06/2018   Severe OSA with AHI 68/hr on CPAP at 12cm H2O    Assessment: 48yom with HF s/p LVAD implant HM3 place 4/19 admitted with drive line infection.    Warfarin PTA 7.5mg  MW and 5mg  all other days admit INR elevated at 5 - probably acute infection related supra-sensitive.    INR today came back at 1.9 slightly subtherapeutic. Hgb stable, plt 200s stable. LDH trending up slightly 200>300s watch. No s/sx of bleeding. Off rifampin given on warfarin now. No heparin infusion at this time per MD.  Goal of Therapy:  INR 2-2.5 Monitor platelets by anticoagulation protocol: Yes   Plan:  Holding heparin infusion for now Repeat warfarin 3mg  today Follow up plan post wound debridement  Daily INR  5/19, PharmD, [facility] PGY2 Cardiology Pharmacy Resident Phone [phone] 08/25/2019       11:53 AM  Please check [email or web address] for unit-specific pharmacist phone numbers

## 2019-08-25 NOTE — Progress Notes (Signed)
Patient ID: Jonathan Wood, male   DOB: 24-Dec-1970, 49 y.o.   MRN: [id number]     Advanced Heart Failure Rounding Note  PCP-Cardiologist: Arvilla Meres, MD    Patient Profile   49 y.o. 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  Subjective:    - 4/5 S/p I&D w/ excisional debridement of subxiphoid abscess. Cultures + Staph Aureus  - 4/7, Returned to OR for debridement, Irrigation and Packing of Abdominal Incision - 4/9 S/P I&D abd wound with Acell and VAC application.   Blood cultures positive for MSSA.  On cefazolin, has PICC in place.   WBCs 15 today, afebrile.   MAP 80s-90s.   Pain at surgical site and using PCA, somewhat drowsy but awakens and interacts.    VAD Interrogation: Speed:5600 Flow:5 Power:4.2  PI:3.4.   No PI events  CT of C/A/P 08/16/19 IMPRESSION: 1. New fluid collections along the efferent tubing from the VAD to the ascending aorta. Findings are suspicious for infection given clinical presentation and elevated white blood cell count. 2. Stable fluid surrounding the line within the left upper quadrant anterior abdominal wall, with decreased adjacent fat stranding. Infection in this region cannot be excluded. 3. Stable cardiomegaly. 4. Stable emphysema. 5. Otherwise no acute intra-abdominal or intrapelvic process.  Objective:   Weight Range: 109.4 kg Body mass index is 30.97 kg/m.   Vital Signs:   Temp:  [97 F (36.1 C)-98.2 F (36.8 C)] 97.9 F (36.6 C) (04/10 0815) Pulse Rate:  [46-124] 102 (04/10 0800) Resp:  [12-26] 14 (04/10 0800) BP: (87-136)/(45-117) 99/77 (04/10 0922) SpO2:  [92 %-100 %] 95 % (04/10 0800) Weight:  [109.4 kg] 109.4 kg (04/10 0500) Last BM Date: 08/23/19  Weight change: Filed Weights   08/23/19 0500 08/24/19 0500 08/25/19 0500  Weight: 107.3 kg 106.9 kg  109.4 kg    Intake/Output:   Intake/Output Summary (Last 24 hours) at 08/25/2019 0934 Last data filed at 08/25/2019 0928 Gross per 24 hour  Intake 1737.57 ml  Output 700 ml  Net 1037.57 ml      Physical Exam    General: Well appearing this am. NAD.  HEENT: Normal. Neck: Supple, JVP 7-8 cm. Carotids OK.  Cardiac:  Mechanical heart sounds with LVAD hum present.  Lungs:  CTAB, normal effort.  Abdomen:  NT, ND, no HSM. No bruits or masses. +BS  LVAD exit site: Wound vac at driveline site.  Extremities:  Warm and dry. No cyanosis, clubbing, rash, or edema.  Neuro:  Alert & oriented x 3. Cranial nerves grossly intact. Moves all 4 extremities w/o difficulty. Affect pleasant       Labs    CBC Recent Labs    08/24/19 0359 08/25/19 0336  WBC 13.0* 15.2*  HGB 9.3* 8.8*  HCT 31.9* 30.7*  MCV 98.5 100.0  PLT 303 292   Basic Metabolic Panel Recent Labs    71/24/58 0359 08/25/19 0336  NA 133* 133*  K 4.7 5.0  CL 94* 94*  CO2 27 28  GLUCOSE 100* 102*  BUN 20 21*  CREATININE 0.96 1.01  CALCIUM 9.0 9.2   Liver Function Tests Recent Labs    08/23/19 0426  AST 134*  ALT 35  ALKPHOS 177*  BILITOT 1.6*  PROT 7.6  ALBUMIN 2.6*   No results for input(s): LIPASE, AMYLASE in the last 72 hours.  Cardiac Enzymes No results for input(s): CKTOTAL, CKMB, CKMBINDEX, TROPONINI in the last 72 hours.  BNP: BNP (last 3 results) Recent Labs    03/21/19 1415 04/23/19 1350  BNP 426.9* 709.6*    ProBNP (last 3 results) No results for input(s): PROBNP in the last 8760 hours.   D-Dimer No results for input(s): DDIMER in the last 72 hours. Hemoglobin A1C No results for input(s): HGBA1C in the last 72 hours. Fasting Lipid Panel No results for input(s): CHOL, HDL, LDLCALC, TRIG, CHOLHDL, LDLDIRECT in the last 72 hours. Thyroid Function Tests No results for input(s): TSH, T4TOTAL, T3FREE, THYROIDAB in the last 72 hours.  Invalid input(s): FREET3  Other  results:   Imaging    No results found.   Medications:     Scheduled Medications: . acetaminophen  1,000 mg Oral Q6H   Or  . acetaminophen (TYLENOL) oral liquid 160 mg/5 mL  1,000 mg Oral Q6H  . bisacodyl  10 mg Oral Daily  . Chlorhexidine Gluconate Cloth  6 each Topical Daily  . docusate sodium  100 mg Oral TID  . DULoxetine  30 mg Oral Daily  . feeding supplement (ENSURE ENLIVE)  237 mL Oral TID BM  . ferrous sulfate  325 mg Oral Q breakfast  . gabapentin  300 mg Oral TID  . hydrALAZINE  50 mg Oral TID  . HYDROmorphone   Intravenous Q4H  . linaclotide  145 mcg Oral QODAY  . mouth rinse  15 mL Mouth Rinse BID  . metoCLOPramide (REGLAN) injection  10 mg Intravenous Q6H  . multivitamin with minerals  1 tablet Oral Daily  . pantoprazole  40 mg Oral BID  . senna-docusate  1 tablet Oral QHS  . sildenafil  20 mg Oral TID  . sodium chloride flush  10-40 mL Intracatheter Q12H  . sodium chloride flush  10-40 mL Intracatheter Q12H  . sodium chloride flush  3 mL Intravenous Q12H  . torsemide  20 mg Oral QODAY  . Warfarin - Pharmacist Dosing Inpatient   Does not apply q1600    Infusions: . sodium chloride 10 mL/hr at 08/24/19 0800  .  ceFAZolin (ANCEF) IV Stopped (08/25/19 0601)  . dextrose 5 % and 0.45% NaCl 10 mL/hr at 08/25/19 0928  . lactated ringers    . phenylephrine (NEO-SYNEPHRINE) Adult infusion      PRN Medications: sodium chloride, acetaminophen, albuterol, bisacodyl **OR** bisacodyl, diphenhydrAMINE **OR** diphenhydrAMINE, hydrOXYzine, magnesium hydroxide, naloxone **AND** sodium chloride flush, ondansetron (ZOFRAN) IV, ondansetron (ZOFRAN) IV, oxyCODONE, sodium chloride flush, sodium chloride flush, sodium chloride flush, traMADol    Assessment/Plan    1. Fever & leukocytosis -> MSSA bacteremia/ Subxiphoid Abscess  - bcx 2/2 MSSA - ID has changed antibiotics to cefazolin, now has PICC.   - WBC 24 -> 29->34K-> 25->20->16->13->15, afebrile.   - He is not a  candidate for VAD exchange due to severe RV failure and noncompliance.  - S/p I&D on 4/5. Wound cultures +MSSA.  - Back to OR 4/7 and 4/9  fror debridement and irrigation of driveline + packing, wound vac in place.    - ID recommends 6 weeks of IV cefazolin followed by long-term suppressive oral cephalexin. - on PCA pump for pain control, still requiring doses.   2.Chronic systolic HF - EF 25% also has severe RV failure.  S/p HM-3 LVAD on 09/06/17 - Can restart home torsemide 20 mg every other day.   3. HM3 LVAD 09/06/2017. - VAD interrogated personally. Parameters stable. - LDH  306 - INR 1.9, on warfarin and off heparin gtt.   - Heparin per Dr Prescott Gum - Off ASA with PUD.   4. Essential HTN - MAPs 80s-90s - Continue hydralazine and sildenafil, can increase hydralazine if needed.   5. AKI - Resolved.    6. OSA - sleep study with very severe OSA (AHI 69/hr) - non-compliant CPAP  7. H/o GI bleed  - 08/2018 EGD completed and showed gastritis and nonbleeding duodenal ulcers, colonoscopy with 5 polyps removed. Protonix was increased to BID.  - No recent GI bleeding - hgb 8.8   - He will stay off aspirin for now.   8. Tobacco Abuse  - says he quit recently  31. Severe protein calorie malnutrition  - nutrition seeing  Can go to step down.   Length of Stay: 9  Loralie Champagne, MD  08/25/2019, 9:34 AM  Advanced Heart Failure Team Pager [phone] (M-F; 7a - 4p)  Please contact [facility] for night-coverage after hours (4p -7a ) and weekends on [email or web address] 9:34 AM

## 2019-08-26 LAB — COOXEMETRY PANEL
Carboxyhemoglobin: 2.3 % — ABNORMAL HIGH (ref 0.5–1.5)
Carboxyhemoglobin: 2.1 % — ABNORMAL HIGH (ref 0.5–1.5)
Methemoglobin: 1 % (ref 0.0–1.5)
Methemoglobin: 0.4 % (ref 0.0–1.5)
O2 Saturation: 46.2 %
O2 Saturation: 54.5 %
Total hemoglobin: 9.7 g/dL — ABNORMAL LOW (ref 12.0–16.0)
Total hemoglobin: 9.7 g/dL — ABNORMAL LOW (ref 12.0–16.0)

## 2019-08-26 LAB — CBC
HCT: 32 % — ABNORMAL LOW (ref 39.0–52.0)
Hemoglobin: 9.3 g/dL — ABNORMAL LOW (ref 13.0–17.0)
MCH: 28.5 pg (ref 26.0–34.0)
MCHC: 29.1 g/dL — ABNORMAL LOW (ref 30.0–36.0)
MCV: 98.2 fL (ref 80.0–100.0)
Platelets: 287 10*3/uL (ref 150–400)
RBC: 3.26 MIL/uL — ABNORMAL LOW (ref 4.22–5.81)
RDW: 19.4 % — ABNORMAL HIGH (ref 11.5–15.5)
WBC: 13.8 10*3/uL — ABNORMAL HIGH (ref 4.0–10.5)
nRBC: 1.5 % — ABNORMAL HIGH (ref 0.0–0.2)

## 2019-08-26 LAB — BASIC METABOLIC PANEL
Anion gap: 12 (ref 5–15)
BUN: 18 mg/dL (ref 6–20)
CO2: 30 mmol/L (ref 22–32)
Calcium: 9.3 mg/dL (ref 8.9–10.3)
Chloride: 94 mmol/L — ABNORMAL LOW (ref 98–111)
Creatinine, Ser: 1.02 mg/dL (ref 0.61–1.24)
GFR calc Af Amer: >60 mL/min (ref >60)
GFR calc non Af Amer: >60 mL/min (ref >60)
Glucose, Bld: 111 mg/dL — ABNORMAL HIGH (ref 70–99)
Potassium: 4.5 mmol/L (ref 3.5–5.1)
Sodium: 136 mmol/L (ref 135–145)

## 2019-08-26 LAB — LACTATE DEHYDROGENASE: LDH: 208 U/L — ABNORMAL HIGH (ref 98–192)

## 2019-08-26 LAB — PROTIME-INR
INR: 1.8 — ABNORMAL HIGH (ref 0.8–1.2)
Prothrombin Time: 20.7 s — ABNORMAL HIGH (ref 11.4–15.2)

## 2019-08-26 MED ORDER — FUROSEMIDE 10 MG/ML IJ SOLN
60.0000 mg | Freq: Two times a day (BID) | INTRAMUSCULAR | Status: AC
  Administered 2019-08-26 (×2): 60 mg via INTRAVENOUS
  Filled 2019-08-26 (×2): qty 6

## 2019-08-26 MED ORDER — HYDRALAZINE HCL 50 MG PO TABS
75.0000 mg | ORAL_TABLET | Freq: Three times a day (TID) | ORAL | Status: DC
  Administered 2019-08-26 – 2019-09-13 (×47): 75 mg via ORAL
  Filled 2019-08-26 (×51): qty 1

## 2019-08-26 MED ORDER — WARFARIN SODIUM 4 MG PO TABS
4.0000 mg | ORAL_TABLET | Freq: Once | ORAL | Status: AC
  Administered 2019-08-26: 17:00:00 4 mg via ORAL
  Filled 2019-08-26 (×2): qty 1

## 2019-08-26 NOTE — Plan of Care (Signed)
  Problem: Education: Goal: Knowledge of General Education information will improve Description: Including pain rating scale, medication(s)/side effects and non-pharmacologic comfort measures Outcome: Progressing   Problem: Health Behavior/Discharge Planning: Goal: Ability to manage health-related needs will improve Outcome: Progressing   Problem: Clinical Measurements: Goal: Ability to maintain clinical measurements within normal limits will improve Outcome: Progressing Goal: Will remain free from infection Outcome: Progressing Goal: Diagnostic test results will improve Outcome: Progressing Goal: Cardiovascular complication will be avoided Outcome: Progressing   Problem: Activity: Goal: Risk for activity intolerance will decrease Outcome: Progressing   Problem: Nutrition: Goal: Adequate nutrition will be maintained Outcome: Progressing   Problem: Coping: Goal: Level of anxiety will decrease Outcome: Progressing   Problem: Elimination: Goal: Will not experience complications related to bowel motility Outcome: Progressing   Problem: Pain Managment: Goal: General experience of comfort will improve Outcome: Progressing   Problem: Safety: Goal: Ability to remain free from injury will improve Outcome: Progressing   Problem: Skin Integrity: Goal: Risk for impaired skin integrity will decrease Outcome: Progressing   Problem: Cardiac: Goal: LVAD will function as expected and patient will experience no clinical alarms Outcome: Progressing

## 2019-08-26 NOTE — Progress Notes (Signed)
Patient ID: Jonathan Wood, male   DOB: 1971/02/06, 49 y.o.   MRN: [id number]     Advanced Heart Failure Rounding Note  PCP-Cardiologist: Arvilla Meres, MD    Patient Profile   49 y.o. 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  Subjective:    - 4/5 S/p I&D w/ excisional debridement of subxiphoid abscess. Cultures + Staph Aureus  - 4/7, Returned to OR for debridement, Irrigation and Packing of Abdominal Incision - 4/9 S/P I&D abd wound with Acell and VAC application.   Blood cultures positive for MSSA.  On cefazolin, has PICC in place.   WBCs 15=>13, afebrile.   MAP 90s. Early am co-ox was low at 46%.   Still with pain at surgical site and using PCA.  Mild dyspnea at rest.    VAD Interrogation: Speed:5600 Flow:5.1 Power:4.2  PI:2.8   No PI events  CT of C/A/P 08/16/19 IMPRESSION: 1. New fluid collections along the efferent tubing from the VAD to the ascending aorta. Findings are suspicious for infection given clinical presentation and elevated white blood cell count. 2. Stable fluid surrounding the line within the left upper quadrant anterior abdominal wall, with decreased adjacent fat stranding. Infection in this region cannot be excluded. 3. Stable cardiomegaly. 4. Stable emphysema. 5. Otherwise no acute intra-abdominal or intrapelvic process.  Objective:   Weight Range: 107.8 kg Body mass index is 30.51 kg/m.   Vital Signs:   Temp:  [97.6 F (36.4 C)-99.1 F (37.3 C)] 99.1 F (37.3 C) (04/11 0715) Pulse Rate:  [97-125] 125 (04/11 0715) Resp:  [16-31] 18 (04/11 0756) BP: (87-118)/(69-93) 105/82 (04/11 0715) SpO2:  [86 %-99 %] 97 % (04/11 0756) Weight:  [107.8 kg] 107.8 kg (04/11 0300) Last BM Date: 08/25/19  Weight change: Filed Weights   08/24/19 0500 08/25/19 0500 08/26/19 0300  Weight:  106.9 kg 109.4 kg 107.8 kg    Intake/Output:   Intake/Output Summary (Last 24 hours) at 08/26/2019 1054 Last data filed at 08/26/2019 0400 Gross per 24 hour  Intake 614.2 ml  Output 1350 ml  Net -735.8 ml      Physical Exam    General: Well appearing this am. NAD.  HEENT: Normal. Neck: Supple, JVP 12 cm. Carotids OK.  Cardiac:  Mechanical heart sounds with LVAD hum present.  Lungs:  CTAB, normal effort.  Abdomen:  NT, ND, no HSM. No bruits or masses. +BS  LVAD exit site: Dressed, minimal drainage.  Skin: Wound vac at sternal surgical site.  Extremities:  Warm and dry. No cyanosis, clubbing, rash.  1+ edema to knees bilaterally.  Neuro:  Alert & oriented x 3. Cranial nerves grossly intact. Moves all 4 extremities w/o difficulty. Affect pleasant     Labs    CBC Recent Labs    08/25/19 0336 08/26/19 0345  WBC 15.2* 13.8*  HGB 8.8* 9.3*  HCT 30.7* 32.0*  MCV 100.0 98.2  PLT 292 287   Basic Metabolic Panel Recent Labs    84/69/62 0336 08/26/19 0345  NA 133* 136  K 5.0 4.5  CL 94* 94*  CO2 28 30  GLUCOSE 102* 111*  BUN 21* 18  CREATININE 1.01 1.02  CALCIUM 9.2 9.3   Liver Function Tests No results for input(s): AST, ALT, ALKPHOS, BILITOT, PROT, ALBUMIN in the last 72 hours. No results for input(s): LIPASE, AMYLASE in the  last 72 hours. Cardiac Enzymes No results for input(s): CKTOTAL, CKMB, CKMBINDEX, TROPONINI in the last 72 hours.  BNP: BNP (last 3 results) Recent Labs    03/21/19 1415 04/23/19 1350  BNP 426.9* 709.6*    ProBNP (last 3 results) No results for input(s): PROBNP in the last 8760 hours.   D-Dimer No results for input(s): DDIMER in the last 72 hours. Hemoglobin A1C No results for input(s): HGBA1C in the last 72 hours. Fasting Lipid Panel No results for input(s): CHOL, HDL, LDLCALC, TRIG, CHOLHDL, LDLDIRECT in the last 72 hours. Thyroid Function Tests No results for input(s): TSH, T4TOTAL, T3FREE, THYROIDAB in the last 72  hours.  Invalid input(s): FREET3  Other results:   Imaging    No results found.   Medications:     Scheduled Medications: . bisacodyl  10 mg Oral Daily  . Chlorhexidine Gluconate Cloth  6 each Topical Daily  . docusate sodium  100 mg Oral TID  . DULoxetine  30 mg Oral Daily  . feeding supplement (ENSURE ENLIVE)  237 mL Oral TID BM  . ferrous sulfate  325 mg Oral Q breakfast  . furosemide  60 mg Intravenous BID  . gabapentin  300 mg Oral TID  . hydrALAZINE  75 mg Oral TID  . HYDROmorphone   Intravenous Q4H  . linaclotide  145 mcg Oral QODAY  . mouth rinse  15 mL Mouth Rinse BID  . multivitamin with minerals  1 tablet Oral Daily  . pantoprazole  40 mg Oral BID  . senna-docusate  1 tablet Oral QHS  . sildenafil  20 mg Oral TID  . sodium chloride flush  10-40 mL Intracatheter Q12H  . sodium chloride flush  10-40 mL Intracatheter Q12H  . sodium chloride flush  3 mL Intravenous Q12H  . torsemide  20 mg Oral QODAY  . Warfarin - Pharmacist Dosing Inpatient   Does not apply q1600    Infusions: . sodium chloride 10 mL/hr at 08/25/19 1926  .  ceFAZolin (ANCEF) IV 2 g (08/26/19 0549)  . dextrose 5 % and 0.45% NaCl Stopped (08/25/19 1414)  . lactated ringers    . phenylephrine (NEO-SYNEPHRINE) Adult infusion      PRN Medications: sodium chloride, acetaminophen, albuterol, alum & mag hydroxide-simeth, bisacodyl **OR** bisacodyl, diphenhydrAMINE **OR** diphenhydrAMINE, hydrOXYzine, magnesium hydroxide, naloxone **AND** sodium chloride flush, ondansetron (ZOFRAN) IV, ondansetron (ZOFRAN) IV, oxyCODONE, sodium chloride flush, sodium chloride flush, sodium chloride flush, traMADol    Assessment/Plan    1. Fever & leukocytosis -> MSSA bacteremia/ Subxiphoid Abscess  - bcx 2/2 MSSA - ID has changed antibiotics to cefazolin, now has PICC.   - WBC 24 -> 29->34K-> 25->20->16->13->15->13, afebrile.   - He is not a candidate for VAD exchange due to severe RV failure and  noncompliance.  - S/p I&D on 4/5. Wound cultures +MSSA.  - Back to OR 4/7 and 4/9  fror debridement and irrigation of driveline + packing, wound vac in place.    - ID recommends 6 weeks of IV cefazolin followed by long-term suppressive oral cephalexin. - on PCA pump for pain control, still requiring doses.   2.Acute on chronic systolic HF - EF 70% also has severe RV failure.  S/p HM-3 LVAD on 09/06/17 - On sildenafil for RV failure.  - He looks volume overloaded on exam today and somewhat dyspneic.  Start Lasix 60 mg IV bid x 2 doses and reassess.  - Will set up CVP and start to monitor.  - Co-ox  low early am, will re-send.   3. HM3 LVAD 09/06/2017. - VAD interrogated personally. Parameters stable. - LDH 306 => 208 - INR 1.8, on warfarin per pharmacy - Off ASA with PUD.   4. Essential HTN - MAPs 90s - Increase hydralazine to 75 mg tid.    5. AKI - Resolved.    6. OSA - sleep study with very severe OSA (AHI 69/hr) - non-compliant CPAP  7. H/o GI bleed  - 08/2018 EGD completed and showed gastritis and nonbleeding duodenal ulcers, colonoscopy with 5 polyps removed. Protonix was increased to BID.  - No recent GI bleeding - hgb 9.3   - He will stay off aspirin for now.   8. Tobacco Abuse  - says he quit recently  65. Severe protein calorie malnutrition  - nutrition seeing  Length of Stay: Gayle Mill, MD  08/26/2019, 10:54 AM  Advanced Heart Failure Team Pager [phone] (M-F; 7a - 4p)  Please contact [facility] for night-coverage after hours (4p -7a ) and weekends on [email or web address] 10:54 AM

## 2019-08-26 NOTE — Progress Notes (Signed)
ANTICOAGULATION CONSULT NOTE - Follow Up Consult  Pharmacy Consult for warfarin Indication: LVAD  No Known Allergies  Patient Measurements: Height: 6\' 2"  (188 cm) Weight: 107.8 kg (237 lb 10.5 oz) IBW/kg (Calculated) : 82.2  Vital Signs: Temp: 99.1 F (37.3 C) (04/11 0715) Temp Source: Oral (04/11 0300) BP: 105/82 (04/11 0715) Pulse Rate: 125 (04/11 0715)  Labs: Recent Labs    08/24/19 0359 08/24/19 0359 08/25/19 0336 08/26/19 0345  HGB 9.3*   < > 8.8* 9.3*  HCT 31.9*  --  30.7* 32.0*  PLT 303  --  292 287  LABPROT 20.0*  --  21.8* 20.7*  INR 1.7*  --  1.9* 1.8*  CREATININE 0.96  --  1.01 1.02   < > = values in this interval not displayed.    Estimated Creatinine Clearance: 118.3 mL/min (by C-G formula based on SCr of 1.02 mg/dL).   Medical History: Past Medical History:  Diagnosis Date  . Asthma   . CHF (congestive heart failure) (HCC)    a. 09/2016: EF 20-25% with cath showing normal cors  . GERD (gastroesophageal reflux disease)   . History of hiatal hernia   . OSA on CPAP 09/06/2018   Severe OSA with AHI 68/hr on CPAP at 12cm H2O    Assessment: 48yom with HF s/p LVAD implant HM3 place 4/19 admitted with drive line infection.    Warfarin PTA 7.5mg  MW and 5mg  all other days admit INR elevated at 5 - probably acute infection related supra-sensitive.    INR today is 1.8 slightly subtherapeutic. Hgb stable, plt 200s stable. LDH trending down 208. No s/sx of bleeding. Off rifampin given on warfarin now. No heparin infusion at this time per MD.  Goal of Therapy:  INR 2-2.5 Monitor platelets by anticoagulation protocol: Yes   Plan:  Holding heparin infusion for now Warfarin 4 mg today Daily INR  5/19, PharmD, [facility] PGY2 Cardiology Pharmacy Resident Phone [phone] 08/26/2019       11:40 AM  Please check [email or web address] for unit-specific pharmacist phone numbers

## 2019-08-26 NOTE — Plan of Care (Signed)
  Problem: Clinical Measurements: Goal: Ability to maintain clinical measurements within normal limits will improve Outcome: Progressing Goal: Diagnostic test results will improve Outcome: Progressing Goal: Cardiovascular complication will be avoided Outcome: Progressing   Problem: Coping: Goal: Level of anxiety will decrease Outcome: Progressing   Problem: Pain Managment: Goal: General experience of comfort will improve Outcome: Progressing

## 2019-08-27 ENCOUNTER — Inpatient Hospital Stay (HOSPITAL_COMMUNITY): Payer: Medicare Other

## 2019-08-27 DIAGNOSIS — I5043 Acute on chronic combined systolic (congestive) and diastolic (congestive) heart failure: Secondary | ICD-10-CM

## 2019-08-27 LAB — PROTIME-INR
INR: 2.1 — ABNORMAL HIGH (ref 0.8–1.2)
Prothrombin Time: 23.4 seconds — ABNORMAL HIGH (ref 11.4–15.2)

## 2019-08-27 LAB — BASIC METABOLIC PANEL
Anion gap: 11 (ref 5–15)
BUN: 16 mg/dL (ref 6–20)
CO2: 33 mmol/L — ABNORMAL HIGH (ref 22–32)
Calcium: 8.9 mg/dL (ref 8.9–10.3)
Chloride: 92 mmol/L — ABNORMAL LOW (ref 98–111)
Creatinine, Ser: 0.97 mg/dL (ref 0.61–1.24)
GFR calc Af Amer: >60 mL/min (ref >60)
GFR calc non Af Amer: >60 mL/min (ref >60)
Glucose, Bld: 107 mg/dL — ABNORMAL HIGH (ref 70–99)
Potassium: 3.5 mmol/L (ref 3.5–5.1)
Sodium: 136 mmol/L (ref 135–145)

## 2019-08-27 LAB — CBC
HCT: 31.7 % — ABNORMAL LOW (ref 39.0–52.0)
Hemoglobin: 9.4 g/dL — ABNORMAL LOW (ref 13.0–17.0)
MCH: 28.7 pg (ref 26.0–34.0)
MCHC: 29.7 g/dL — ABNORMAL LOW (ref 30.0–36.0)
MCV: 96.9 fL (ref 80.0–100.0)
Platelets: 265 10*3/uL (ref 150–400)
RBC: 3.27 MIL/uL — ABNORMAL LOW (ref 4.22–5.81)
RDW: 19.2 % — ABNORMAL HIGH (ref 11.5–15.5)
WBC: 12.5 10*3/uL — ABNORMAL HIGH (ref 4.0–10.5)
nRBC: 0.8 % — ABNORMAL HIGH (ref 0.0–0.2)

## 2019-08-27 LAB — LACTATE DEHYDROGENASE: LDH: 182 U/L (ref 98–192)

## 2019-08-27 LAB — COOXEMETRY PANEL
Carboxyhemoglobin: 2.9 % — ABNORMAL HIGH (ref 0.5–1.5)
Methemoglobin: 1 % (ref 0.0–1.5)
O2 Saturation: 62.2 %
Total hemoglobin: 10.8 g/dL — ABNORMAL LOW (ref 12.0–16.0)

## 2019-08-27 LAB — MAGNESIUM: Magnesium: 1.7 mg/dL (ref 1.7–2.4)

## 2019-08-27 LAB — GLUCOSE, CAPILLARY: Glucose-Capillary: 157 mg/dL — ABNORMAL HIGH (ref 70–99)

## 2019-08-27 MED ORDER — LINACLOTIDE 145 MCG PO CAPS
145.0000 ug | ORAL_CAPSULE | Freq: Every day | ORAL | Status: DC
  Administered 2019-08-28 – 2019-09-04 (×6): 145 ug via ORAL
  Filled 2019-08-27 (×18): qty 1

## 2019-08-27 MED ORDER — POTASSIUM CHLORIDE CRYS ER 20 MEQ PO TBCR
20.0000 meq | EXTENDED_RELEASE_TABLET | Freq: Once | ORAL | Status: AC
  Administered 2019-08-27: 15:00:00 20 meq via ORAL
  Filled 2019-08-27: qty 1

## 2019-08-27 MED ORDER — SORBITOL 70 % SOLN
30.0000 mL | Freq: Every day | Status: DC | PRN
  Filled 2019-08-27: qty 30

## 2019-08-27 MED ORDER — GUAIFENESIN-DM 100-10 MG/5ML PO SYRP
5.0000 mL | ORAL_SOLUTION | ORAL | Status: DC | PRN
  Administered 2019-08-27 – 2019-09-12 (×13): 5 mL via ORAL
  Filled 2019-08-27 (×13): qty 5

## 2019-08-27 MED ORDER — SORBITOL 70 % SOLN
60.0000 mL | Freq: Every morning | Status: DC
  Administered 2019-08-27: 12:00:00 60 mL via ORAL
  Filled 2019-08-27 (×2): qty 60

## 2019-08-27 MED ORDER — FUROSEMIDE 10 MG/ML IJ SOLN
60.0000 mg | Freq: Two times a day (BID) | INTRAMUSCULAR | Status: AC
  Administered 2019-08-27 – 2019-08-28 (×2): 60 mg via INTRAVENOUS
  Filled 2019-08-27 (×2): qty 6

## 2019-08-27 MED ORDER — ZINC SULFATE 220 (50 ZN) MG PO CAPS
220.0000 mg | ORAL_CAPSULE | Freq: Every day | ORAL | Status: DC
  Administered 2019-08-27 – 2019-09-13 (×17): 220 mg via ORAL
  Filled 2019-08-27 (×16): qty 1

## 2019-08-27 MED ORDER — WARFARIN SODIUM 3 MG PO TABS
3.0000 mg | ORAL_TABLET | Freq: Once | ORAL | Status: AC
  Administered 2019-08-27: 15:00:00 3 mg via ORAL
  Filled 2019-08-27: qty 1

## 2019-08-27 NOTE — Progress Notes (Signed)
Driveline dressing changed using daily kit, w/sliver strip (Sterile techniques used). Site looks clean, intact, no odor, anchor applied.

## 2019-08-27 NOTE — Progress Notes (Signed)
LVAD Coordinator Rounding Note:  Admitted 08/16/19 by Dr. [name] due to VAD drive line infection/pneumonia/sepsis  HM III LVAD implanted on 09/06/17 by Dr. Cyndia Bent under Destination Therapy criteria.  Pt is complaining   Vital signs: Temp: 97.9 HR: 108 Doppler Pressure: not done Automatic BP: 106/94 (100) O2 Sat: 100% on 2L Wt: 227.5>231>235>233.9>236.3>236.5>235.6  lbs   LVAD interrogation reveals:  Speed: 5600 Flow: 5.0 Power:  4.4w PI: 3.1 Alarms: none Events: none Hematocrit: 31  Fixed speed: 5600 Low speed limit: 5300  Drive Line: CDI. Twice weekly dressing changes using daily kit w/siliver strip by bedside nurse. Next dressing change 08/27/19.   Sternal wound: [place] and packing clean, dry, intact. Will change in OR per Dr Prescott Gum.   Labs:  LDH trend: 227>230>271>245>233>307>336>182  INR trend: 4.5>5.0>1.7>1.7>1.7>1.8>1.7>2.1  WBC: 27.3>26.2>24>34.6>25.8>20.8>16.9>13.0>12.5  Alk Phos: 138> 177  AST: 53> 154  ALT: 17> 35  Infection: - blood cultures 08/16/19>>staphylococcus aureus - blood cultures 08/18/19>> NGTD - Drive line tunnel / sternum 08/20/19>> abundant WBC, gram + cocci, culture- few staph aureus - Drive line AFB 3/1/674>> pending - Drive line/Sternal Fungus culture 08/20/19>> negative   Anticoagulation Plan: -INR Goal: 2.0 - 2.5  -ASA Dose: none  Adverse Events: - driveline cx 25/5/25>> staph aureus - Admitted for sternal/drive line infection. OR for debridement 08/20/19- + staph aureus wound cultures   Plan/Recommendations:  1. Call VAD pager if any VAD equipment or drive line issues. 2. Twice weekly dressing changes by bedside nurse.   [name] RN [place] Coordinator  Office: [phone]  24/7 Pager: [phone]

## 2019-08-27 NOTE — Progress Notes (Signed)
3 Days Post-Op Procedure(s) (LRB): Irrigation and Debridement with WOUND VAC APPLICATION (N/A) Application Of A-Cell Of Chest/Abdomen (N/A) Subjective: Patient examined and the wound VAC to the proximal VAD driveline tunnel site personally inspected.  Patient is still having some incisional pain and uses his PA C actively.  There is serosanguineous drainage.  The wound VAC sponge is dry and compressed.  The power cord exit site dressing has been reinforced and will need to be changed with wet-to-dry dressing changes and the sterile technique daily. Objective: Vital signs in last 24 hours: Temp:  [97.6 F (36.4 C)-98.9 F (37.2 C)] 98.6 F (37 C) (04/12 1023) Pulse Rate:  [51-117] 86 (04/12 0419) Cardiac Rhythm: Sinus tachycardia (04/12 1023) Resp:  [16-26] 19 (04/12 1012) BP: (96-116)/(51-89) 110/68 (04/12 1023) SpO2:  [88 %-100 %] 96 % (04/12 1012) Weight:  [103.7 kg] 103.7 kg (04/12 0500)  Hemodynamic parameters for last 24 hours: CVP:  [13 mmHg-16 mmHg] 15 mmHg  Intake/Output from previous day: 04/11 0701 - 04/12 0700 In: 510 [I.V.:110; IV Piggyback:400] Out: 2450 [Urine:2375; Drains:75] Intake/Output this shift: No intake/output data recorded.  Exam Patient appropriate and responsive but concerned over his incisional pain Breath sounds clear Normal VAD hum Wound VAC sponge compressed and intact  Lab Results: Recent Labs    08/26/19 0345 08/27/19 0446  WBC 13.8* 12.5*  HGB 9.3* 9.4*  HCT 32.0* 31.7*  PLT 287 265   BMET:  Recent Labs    08/26/19 0345 08/27/19 0446  NA 136 136  K 4.5 3.5  CL 94* 92*  CO2 30 33*  GLUCOSE 111* 107*  BUN 18 16  CREATININE 1.02 0.97  CALCIUM 9.3 8.9    PT/INR:  Recent Labs    08/27/19 0446  LABPROT 23.4*  INR 2.1*   ABG    Component Value Date/Time   PHART 7.405 09/08/2017 0049   HCO3 23.4 09/08/2017 0049   TCO2 32 09/12/2017 1605   ACIDBASEDEF 1.0 09/08/2017 0049   O2SAT 62.2 08/27/2019 0441   CBG (last 3)   Recent Labs    08/27/19 0631  GLUCAP 157*    Assessment/Plan: S/P Procedure(s) (LRB): Irrigation and Debridement with WOUND VAC APPLICATION (N/A) Application Of A-Cell Of Chest/Abdomen (N/A) Wound VAC of the proximal driveline tunnel is working appropriately.  White count continues to improve and he is afebrile.  The wound cultures have returned MSSA  And the patient is receiving IV antibiotics as directed by ID-Ancef.  The wound VAC will be changed in the operating room later this week either Thursday or Friday.  The separate driveline exit site needs to be packed with sterile saline wet-to-dry dressings daily.     LOS: 11 days    Kathlee Nations Trigt III 08/27/2019

## 2019-08-27 NOTE — Progress Notes (Addendum)
Patient ID: Jonathan Wood, male   DOB: January 01, 1971, 49 y.o.   MRN: [id number]     Advanced Heart Failure Rounding Note  PCP-Cardiologist: Arvilla Meres, MD    Patient Profile   49 y.o. 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  Subjective:    - 4/5 S/p I&D w/ excisional debridement of subxiphoid abscess. Cultures + Staph Aureus  - 4/7, Returned to OR for debridement, Irrigation and Packing of Abdominal Incision - 4/9 S/P I&D abd wound with Acell and VAC application.   Blood cultures positive for MSSA.  On cefazolin, has PICC in place.   WBCs 15=>13=>12, afebrile.   MAP 80s 90s. Co-ox 62%.   Continues w/ abdominal pain. Still using pain pump.   Could not get accurate CVP reading this am. RN working to troubleshoot.   VAD Interrogation: Speed:5600 Flow:5.0 Power:4.3  PI:3.0   No PI events  CT of C/A/P 08/16/19 IMPRESSION: 1. New fluid collections along the efferent tubing from the VAD to the ascending aorta. Findings are suspicious for infection given clinical presentation and elevated white blood cell count. 2. Stable fluid surrounding the line within the left upper quadrant anterior abdominal wall, with decreased adjacent fat stranding. Infection in this region cannot be excluded. 3. Stable cardiomegaly. 4. Stable emphysema. 5. Otherwise no acute intra-abdominal or intrapelvic process.  Objective:   Weight Range: 103.7 kg Body mass index is 29.35 kg/m.   Vital Signs:   Temp:  [97.6 F (36.4 C)-98.9 F (37.2 C)] 98.4 F (36.9 C) (04/12 0405) Pulse Rate:  [51-117] 86 (04/12 0419) Resp:  [16-26] 16 (04/12 0440) BP: (96-116)/(51-89) 106/85 (04/12 0405) SpO2:  [88 %-100 %] 95 % (04/12 0440) Weight:  [103.7 kg] 103.7 kg (04/12 0500) Last BM Date: 08/26/19  Weight change: Filed Weights   08/25/19  0500 08/26/19 0300 08/27/19 0500  Weight: 109.4 kg 107.8 kg 103.7 kg    Intake/Output:   Intake/Output Summary (Last 24 hours) at 08/27/2019 0857 Last data filed at 08/27/2019 0100 Gross per 24 hour  Intake 510 ml  Output 2450 ml  Net -1940 ml      Physical Exam    PHYSICAL EXAM: General:  Well appearing. No respiratory difficulty HEENT: normal Neck: supple. no JVD. Carotids 2+ bilat; no bruits. No lymphadenopathy or thyromegaly appreciated. Cor: + LVAD HUM  Lungs: clear, no wheeze  Abdomen: mildly distended but nontender. No hepatosplenomegaly. No bruits or masses. Good bowel sounds. + wound vac at sternal surgical site.  LVAD site: dressed w/ dry/ clean bandage. No visible drainage  Extremities: no cyanosis, clubbing, rash, trace bilateral LE edema Neuro: alert & oriented x 3, cranial nerves grossly intact. moves all 4 extremities w/o difficulty. Affect pleasant.   Labs    CBC Recent Labs    08/26/19 0345 08/27/19 0446  WBC 13.8* 12.5*  HGB 9.3* 9.4*  HCT 32.0* 31.7*  MCV 98.2 96.9  PLT 287 265   Basic Metabolic Panel Recent Labs    40/98/11 0345 08/27/19 0446  NA 136 136  K 4.5 3.5  CL 94* 92*  CO2 30 33*  GLUCOSE 111* 107*  BUN 18 16  CREATININE 1.02 0.97  CALCIUM 9.3 8.9  MG  --  1.7   Liver Function Tests No results for input(s): AST, ALT, ALKPHOS, BILITOT, PROT, ALBUMIN in the last 72 hours. No  results for input(s): LIPASE, AMYLASE in the last 72 hours. Cardiac Enzymes No results for input(s): CKTOTAL, CKMB, CKMBINDEX, TROPONINI in the last 72 hours.  BNP: BNP (last 3 results) Recent Labs    03/21/19 1415 04/23/19 1350  BNP 426.9* 709.6*    ProBNP (last 3 results) No results for input(s): PROBNP in the last 8760 hours.   D-Dimer No results for input(s): DDIMER in the last 72 hours. Hemoglobin A1C No results for input(s): HGBA1C in the last 72 hours. Fasting Lipid Panel No results for input(s): CHOL, HDL, LDLCALC, TRIG, CHOLHDL,  LDLDIRECT in the last 72 hours. Thyroid Function Tests No results for input(s): TSH, T4TOTAL, T3FREE, THYROIDAB in the last 72 hours.  Invalid input(s): FREET3  Other results:   Imaging    No results found.   Medications:     Scheduled Medications: . bisacodyl  10 mg Oral Daily  . Chlorhexidine Gluconate Cloth  6 each Topical Daily  . docusate sodium  100 mg Oral TID  . DULoxetine  30 mg Oral Daily  . feeding supplement (ENSURE ENLIVE)  237 mL Oral TID BM  . ferrous sulfate  325 mg Oral Q breakfast  . gabapentin  300 mg Oral TID  . hydrALAZINE  75 mg Oral TID  . HYDROmorphone   Intravenous Q4H  . linaclotide  145 mcg Oral QODAY  . mouth rinse  15 mL Mouth Rinse BID  . multivitamin with minerals  1 tablet Oral Daily  . pantoprazole  40 mg Oral BID  . senna-docusate  1 tablet Oral QHS  . sildenafil  20 mg Oral TID  . sodium chloride flush  10-40 mL Intracatheter Q12H  . sodium chloride flush  10-40 mL Intracatheter Q12H  . sodium chloride flush  3 mL Intravenous Q12H  . torsemide  20 mg Oral QODAY  . Warfarin - Pharmacist Dosing Inpatient   Does not apply q1600    Infusions: . sodium chloride 10 mL/hr at 08/25/19 1926  .  ceFAZolin (ANCEF) IV 2 g (08/27/19 0530)  . dextrose 5 % and 0.45% NaCl Stopped (08/25/19 1414)  . lactated ringers    . phenylephrine (NEO-SYNEPHRINE) Adult infusion      PRN Medications: sodium chloride, acetaminophen, albuterol, alum & mag hydroxide-simeth, bisacodyl **OR** bisacodyl, diphenhydrAMINE **OR** diphenhydrAMINE, guaiFENesin-dextromethorphan, hydrOXYzine, magnesium hydroxide, naloxone **AND** sodium chloride flush, ondansetron (ZOFRAN) IV, ondansetron (ZOFRAN) IV, oxyCODONE, sodium chloride flush, sodium chloride flush, sodium chloride flush, traMADol    Assessment/Plan    1. Fever & leukocytosis -> MSSA bacteremia/ Subxiphoid Abscess  - bcx 2/2 MSSA - ID has changed antibiotics to cefazolin, now has PICC.   - WBC 24 ->  29->34K-> 25->20->16->13->15->13->12, afebrile.   - He is not a candidate for VAD exchange due to severe RV failure and noncompliance.  - S/p I&D on 4/5. Wound cultures +MSSA.  - Back to OR 4/7 and 4/9  fror debridement and irrigation of driveline + packing, wound vac in place.    - ID recommends 6 weeks of IV cefazolin followed by long-term suppressive oral cephalexin. - on PCA pump for pain control, still requiring doses.   2.Acute on chronic systolic HF - EF 15% also has severe RV failure.  S/p HM-3 LVAD on 09/06/17 - On sildenafil for RV failure.  - IV Lasix given yesterday 60 mg x 2 doses for volume overload. Still has mild LEE on exam. Checking CVP to better assess. Unable to get accurate reading this morning. RN working to troubleshoot. Will  f/u and give additional IV lasix if needed.  - Co-ox 62%   3. HM3 LVAD 09/06/2017. - VAD interrogated personally. Parameters stable. - LDH 182 today  - INR 2.1, on warfarin per pharmacy - Off ASA with PUD.    4. Essential HTN - MAPs 80s- 90s - Continue hydralazine 75 mg tid. - Continue sildenafil 20 mg tid     5. AKI - Resolved.    6. OSA - sleep study with very severe OSA (AHI 69/hr) - non-compliant CPAP  7. H/o GI bleed  - 08/2018 EGD completed and showed gastritis and nonbleeding duodenal ulcers, colonoscopy with 5 polyps removed. Protonix was increased to BID.  - No recent GI bleeding - hgb 9.4 - He will stay off aspirin for now.   8. Tobacco Abuse  - says he quit recently  93. Severe protein calorie malnutrition  - nutrition seeing  Length of Stay: [address], PA-C  08/27/2019, 8:57 AM  Advanced Heart Failure Team Pager [phone] (M-F; 7a - 4p)  Please contact [facility] for night-coverage after hours (4p -7a ) and weekends on [email or web address] 8:57 AM   Patient seen with PA, agree with the above note.    Complains of abdominal discomfort and nausea.  Diuresed well yesterday, creatinine stable.    Abdomen is distended and uncomfortable, not tender. JVP mildly elevated with 1+ edema to knees.   Will give Lasix 60 mg IV bid x 2 more doses.   Will get KUB and push laxatives for now, possibly constipated.    Continue cefazolin for MSSA.   Will need wound vac change Thurs or Fri.   Loralie Champagne 08/27/2019 4:27 PM

## 2019-08-27 NOTE — Progress Notes (Signed)
ANTICOAGULATION CONSULT NOTE - Follow Up Consult  Pharmacy Consult for warfarin Indication: LVAD  No Known Allergies  Patient Measurements: Height: 6\' 2"  (188 cm) Weight: 103.7 kg (228 lb 9.9 oz) IBW/kg (Calculated) : 82.2  Vital Signs: Temp: 98.3 F (36.8 C) (04/12 1122) Temp Source: Oral (04/12 1122) BP: 101/82 (04/12 1122) Pulse Rate: 86 (04/12 0419)  Labs: Recent Labs    08/25/19 0336 08/25/19 0336 08/26/19 0345 08/27/19 0446  HGB 8.8*   < > 9.3* 9.4*  HCT 30.7*  --  32.0* 31.7*  PLT 292  --  287 265  LABPROT 21.8*  --  20.7* 23.4*  INR 1.9*  --  1.8* 2.1*  CREATININE 1.01  --  1.02 0.97   < > = values in this interval not displayed.    Estimated Creatinine Clearance: 122.2 mL/min (by C-G formula based on SCr of 0.97 mg/dL).   Medical History: Past Medical History:  Diagnosis Date  . Asthma   . CHF (congestive heart failure) (HCC)    a. 09/2016: EF 20-25% with cath showing normal cors  . GERD (gastroesophageal reflux disease)   . History of hiatal hernia   . OSA on CPAP 09/06/2018   Severe OSA with AHI 68/hr on CPAP at 12cm H2O    Assessment: 48yom with HF s/p LVAD implant HM3 place 4/19 admitted with drive line infection.    Warfarin PTA 7.5mg  MW and 5mg  all other days admit INR elevated at 5 - probably acute infection related supra-sensitive.    INR today is at goal. Hgb stable, pltc stable. LDH trending down 208. No s/sx of bleeding. Off rifampin given on warfarin now. No heparin infusion at this time per MD.  Goal of Therapy:  INR 2-2.5 Monitor platelets by anticoagulation protocol: Yes   Plan:  Holding heparin infusion for now Warfarin 3 mg today Daily INR  5/19, [facility] Clinical Pharmacist Phone [phone]  08/27/2019 1:40 PM   Please check [email or web address] for unit-specific pharmacist phone numbers

## 2019-08-27 NOTE — Progress Notes (Addendum)
Patient in bed alert. Was assisted by staff to and from the bathroom.  1918- On rested state, mews fired from red to yellow. No acute change for this patient. Pulse rate reported to have been elevated throughout the day.   2022- Soft pressure noted. Mews fired red, doppler pressure taken as well. 92. Patient hypertension meds will be held tonight due to hypotension.   2050- patient's heart rate and respirations up due to patient's c/o pain. On dilaudid pca but unable to use because unable to flush single lumen line. Oxy given instead po. At this time, charge nurse Shanda Bumps made aware of patient's vitals and interventions. Will continue to update her and monitor patient. No distress at this time.   2150- patient appears to be resting well. Hooked back to pca pump.  2200- temp of 101.7 oral, tylenol given, scheduled antibiotics given as well. Incentive spirometry training done and patient requires much assistance and teach back. Patient is lacking motivation. Q hrly vitals check.  2300- temp 99.1, b/p still soft but better, hr still elevated. resp even and unlabored. Resting in bed at this time. Easily arousable.   0000-MEWS continues to fire red, temp has improved, HR is still increased, but in 120s, no distress at this time. Remains on hrly monitoring due to red MEWS. Systolic remains soft at 99, doppler at 100. HTN meds held at bed time due to hypotension.    0100- Patient mews remain in the red. No acute changes at this time. Will continue to monitor.  0200- Mews remain in the red. Guidelines not implemented due to previous red mews , with no acute change.Will continue to get vitals q 4.  0400- Mews is red, b/p has improved, temp has increased, prn tylenol given. HR remains up and breathing increased during him sleeping.

## 2019-08-28 ENCOUNTER — Inpatient Hospital Stay (HOSPITAL_COMMUNITY): Payer: Medicare Other

## 2019-08-28 DIAGNOSIS — D72829 Elevated white blood cell count, unspecified: Secondary | ICD-10-CM

## 2019-08-28 DIAGNOSIS — Z95828 Presence of other vascular implants and grafts: Secondary | ICD-10-CM

## 2019-08-28 LAB — COOXEMETRY PANEL
Carboxyhemoglobin: 3.4 % — ABNORMAL HIGH (ref 0.5–1.5)
Methemoglobin: 1.1 % (ref 0.0–1.5)
O2 Saturation: 58 %
Total hemoglobin: 9.8 g/dL — ABNORMAL LOW (ref 12.0–16.0)

## 2019-08-28 LAB — BASIC METABOLIC PANEL
Anion gap: 12 (ref 5–15)
BUN: 14 mg/dL (ref 6–20)
CO2: 31 mmol/L (ref 22–32)
Calcium: 8.7 mg/dL — ABNORMAL LOW (ref 8.9–10.3)
Chloride: 91 mmol/L — ABNORMAL LOW (ref 98–111)
Creatinine, Ser: 0.92 mg/dL (ref 0.61–1.24)
GFR calc Af Amer: >60 mL/min (ref >60)
GFR calc non Af Amer: >60 mL/min (ref >60)
Glucose, Bld: 118 mg/dL — ABNORMAL HIGH (ref 70–99)
Potassium: 3.5 mmol/L (ref 3.5–5.1)
Sodium: 134 mmol/L — ABNORMAL LOW (ref 135–145)

## 2019-08-28 LAB — CBC
HCT: 31.6 % — ABNORMAL LOW (ref 39.0–52.0)
Hemoglobin: 9.3 g/dL — ABNORMAL LOW (ref 13.0–17.0)
MCH: 28.7 pg (ref 26.0–34.0)
MCHC: 29.4 g/dL — ABNORMAL LOW (ref 30.0–36.0)
MCV: 97.5 fL (ref 80.0–100.0)
Platelets: 236 10*3/uL (ref 150–400)
RBC: 3.24 MIL/uL — ABNORMAL LOW (ref 4.22–5.81)
RDW: 19 % — ABNORMAL HIGH (ref 11.5–15.5)
WBC: 21.1 10*3/uL — ABNORMAL HIGH (ref 4.0–10.5)
nRBC: 0.3 % — ABNORMAL HIGH (ref 0.0–0.2)

## 2019-08-28 LAB — PROTIME-INR
INR: 2.1 — ABNORMAL HIGH (ref 0.8–1.2)
Prothrombin Time: 23.3 seconds — ABNORMAL HIGH (ref 11.4–15.2)

## 2019-08-28 LAB — LACTATE DEHYDROGENASE: LDH: 214 U/L — ABNORMAL HIGH (ref 98–192)

## 2019-08-28 MED ORDER — VANCOMYCIN HCL 1750 MG/350ML IV SOLN
1750.0000 mg | Freq: Once | INTRAVENOUS | Status: AC
  Administered 2019-08-28: 1750 mg via INTRAVENOUS
  Filled 2019-08-28: qty 350

## 2019-08-28 MED ORDER — VANCOMYCIN HCL 1250 MG/250ML IV SOLN
1250.0000 mg | Freq: Three times a day (TID) | INTRAVENOUS | Status: DC
  Administered 2019-08-28 – 2019-08-30 (×6): 1250 mg via INTRAVENOUS
  Filled 2019-08-28 (×8): qty 250

## 2019-08-28 MED ORDER — SODIUM CHLORIDE 0.9 % IV SOLN
2.0000 g | Freq: Three times a day (TID) | INTRAVENOUS | Status: DC
  Administered 2019-08-28 – 2019-08-31 (×8): 2 g via INTRAVENOUS
  Filled 2019-08-28 (×10): qty 2

## 2019-08-28 MED ORDER — WARFARIN SODIUM 2 MG PO TABS
2.0000 mg | ORAL_TABLET | Freq: Once | ORAL | Status: AC
  Administered 2019-08-28: 17:00:00 2 mg via ORAL
  Filled 2019-08-28: qty 1

## 2019-08-28 MED ORDER — POTASSIUM CHLORIDE CRYS ER 20 MEQ PO TBCR
40.0000 meq | EXTENDED_RELEASE_TABLET | Freq: Once | ORAL | Status: AC
  Administered 2019-08-28: 09:00:00 40 meq via ORAL
  Filled 2019-08-28: qty 2

## 2019-08-28 MED ORDER — FUROSEMIDE 10 MG/ML IJ SOLN
80.0000 mg | Freq: Two times a day (BID) | INTRAMUSCULAR | Status: DC
  Administered 2019-08-28 – 2019-09-04 (×13): 80 mg via INTRAVENOUS
  Filled 2019-08-28 (×13): qty 8

## 2019-08-28 MED ORDER — SODIUM CHLORIDE 0.9 % IV SOLN
2.0000 g | Freq: Three times a day (TID) | INTRAVENOUS | Status: DC
  Administered 2019-08-28: 12:00:00 2 g via INTRAVENOUS
  Filled 2019-08-28 (×4): qty 2

## 2019-08-28 MED ORDER — FENTANYL CITRATE (PF) 100 MCG/2ML IJ SOLN
25.0000 ug | Freq: Once | INTRAMUSCULAR | Status: AC
  Administered 2019-08-28: 100 ug via INTRAVENOUS
  Filled 2019-08-28: qty 2

## 2019-08-28 MED ORDER — WARFARIN SODIUM 4 MG PO TABS: 4.0000 mg | ORAL_TABLET | Freq: Once | ORAL | Status: DC

## 2019-08-28 MED ORDER — FUROSEMIDE 10 MG/ML IJ SOLN: 80.0000 mg | Freq: Two times a day (BID) | INTRAMUSCULAR | Status: DC

## 2019-08-28 NOTE — Progress Notes (Addendum)
Patient ID: Jonathan Wood, male   DOB: 03-21-1971, 49 y.o.   MRN: [id number]     Advanced Heart Failure Rounding Note  PCP-Cardiologist: Arvilla Meres, MD    Patient Profile   49 y.o. 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  Subjective:    - 4/5 S/p I&D w/ excisional debridement of subxiphoid abscess. Cultures + Staph Aureus  - 4/7, Returned to OR for debridement, Irrigation and Packing of Abdominal Incision - 4/9 S/P I&D abd wound with Acell and VAC application.   Blood cultures positive for MSSA.  On cefazolin, has PICC in place.   WBC has increased, 12>>21K w/ fever. Spiked a temp of 101.7 overnight. Temp down to 98.9 after dose of tylenol.   MAP 80s- 90s. Co-ox 58%.   Abdominal plain film yesterday unremarkable. Continues w/ constipation. Has been using pain pump less. Notes h/o IBS.    VAD Interrogation: Speed:5600 Flow:5.1 Power:4.2  PI:3.2   No PI events  CT of C/A/P 08/16/19 IMPRESSION: 1. New fluid collections along the efferent tubing from the VAD to the ascending aorta. Findings are suspicious for infection given clinical presentation and elevated white blood cell count. 2. Stable fluid surrounding the line within the left upper quadrant anterior abdominal wall, with decreased adjacent fat stranding. Infection in this region cannot be excluded. 3. Stable cardiomegaly. 4. Stable emphysema. 5. Otherwise no acute intra-abdominal or intrapelvic process.  Objective:   Weight Range: 102.8 kg Body mass index is 29.1 kg/m.   Vital Signs:   Temp:  [98.3 F (36.8 C)-101.7 F (38.7 C)] 98.9 F (37.2 C) (04/13 0453) Pulse Rate:  [98-131] 123 (04/13 0453) Resp:  [17-34] 27 (04/13 0453) BP: (89-110)/(68-93) 108/77 (04/13 0453) SpO2:  [87 %-100 %] 99 % (04/13 0453) Weight:  [102.8 kg] 102.8 kg  (04/13 0500) Last BM Date: 08/28/19  Weight change: Filed Weights   08/26/19 0300 08/27/19 0500 08/28/19 0500  Weight: 107.8 kg 103.7 kg 102.8 kg    Intake/Output:   Intake/Output Summary (Last 24 hours) at 08/28/2019 0804 Last data filed at 08/28/2019 0034 Gross per 24 hour  Intake 340 ml  Output 625 ml  Net -285 ml    Telemetry: sinus tach 120s   Physical Exam    PHYSICAL EXAM: CVP 15 General:  fatigue appearing. No respiratory difficulty HEENT: normal Neck: supple. JVD elevated to ear. Carotids 2+ bilat; no bruits. No lymphadenopathy or thyromegaly appreciated. Cor: + LVAD HUM Lungs: clear Abdomen: distended with mild diffuse tenderness. No hepatosplenomegaly. No bruits or masses. Good bowel sounds. + wound vac at sternal surgical site.  LVAD Site: dressed w/ dry and clean bandage. No drainage.  Extremities: no cyanosis, clubbing, rash, trace-1+ bilateral ankle and pretibial edema Neuro: alert & oriented x 3, cranial nerves grossly intact. moves all 4 extremities w/o difficulty. Affect pleasant.  Labs    CBC Recent Labs    08/27/19 0446 08/28/19 0330  WBC 12.5* 21.1*  HGB 9.4* 9.3*  HCT 31.7* 31.6*  MCV 96.9 97.5  PLT 265 236   Basic Metabolic Panel Recent Labs    26/83/41 0446 08/28/19 0330  NA 136 134*  K 3.5 3.5  CL 92* 91*  CO2 33* 31  GLUCOSE 107* 118*  BUN 16 14  CREATININE 0.97 0.92  CALCIUM 8.9 8.7*  MG 1.7  --  Liver Function Tests No results for input(s): AST, ALT, ALKPHOS, BILITOT, PROT, ALBUMIN in the last 72 hours. No results for input(s): LIPASE, AMYLASE in the last 72 hours. Cardiac Enzymes No results for input(s): CKTOTAL, CKMB, CKMBINDEX, TROPONINI in the last 72 hours.  BNP: BNP (last 3 results) Recent Labs    03/21/19 1415 04/23/19 1350  BNP 426.9* 709.6*    ProBNP (last 3 results) No results for input(s): PROBNP in the last 8760 hours.   D-Dimer No results for input(s): DDIMER in the last 72 hours. Hemoglobin  A1C No results for input(s): HGBA1C in the last 72 hours. Fasting Lipid Panel No results for input(s): CHOL, HDL, LDLCALC, TRIG, CHOLHDL, LDLDIRECT in the last 72 hours. Thyroid Function Tests No results for input(s): TSH, T4TOTAL, T3FREE, THYROIDAB in the last 72 hours.  Invalid input(s): FREET3  Other results:   Imaging    DG Abd 1 View  Result Date: 08/27/2019 CLINICAL DATA:  Abdominal distension EXAM: ABDOMEN - 1 VIEW COMPARISON:  09/06/2017 FINDINGS: The bowel gas pattern is normal. No radio-opaque calculi or other significant radiographic abnormality are seen. IMPRESSION: No acute abnormality noted. Electronically Signed   By: Alcide Clever M.D.   On: 08/27/2019 19:08     Medications:     Scheduled Medications: . bisacodyl  10 mg Oral Daily  . Chlorhexidine Gluconate Cloth  6 each Topical Daily  . docusate sodium  100 mg Oral TID  . DULoxetine  30 mg Oral Daily  . feeding supplement (ENSURE ENLIVE)  237 mL Oral TID BM  . ferrous sulfate  325 mg Oral Q breakfast  . furosemide  60 mg Intravenous BID  . gabapentin  300 mg Oral TID  . hydrALAZINE  75 mg Oral TID  . HYDROmorphone   Intravenous Q4H  . linaclotide  145 mcg Oral Daily  . mouth rinse  15 mL Mouth Rinse BID  . multivitamin with minerals  1 tablet Oral Daily  . pantoprazole  40 mg Oral BID  . senna-docusate  1 tablet Oral QHS  . sildenafil  20 mg Oral TID  . sodium chloride flush  10-40 mL Intracatheter Q12H  . sodium chloride flush  10-40 mL Intracatheter Q12H  . sodium chloride flush  3 mL Intravenous Q12H  . sorbitol  60 mL Oral q morning - 10a  . torsemide  20 mg Oral QODAY  . Warfarin - Pharmacist Dosing Inpatient   Does not apply q1600  . zinc sulfate  220 mg Oral Daily    Infusions: . sodium chloride 10 mL/hr at 08/25/19 1926  .  ceFAZolin (ANCEF) IV 2 g (08/28/19 9373)  . dextrose 5 % and 0.45% NaCl Stopped (08/25/19 1414)  . lactated ringers    . phenylephrine (NEO-SYNEPHRINE) Adult infusion       PRN Medications: sodium chloride, acetaminophen, albuterol, alum & mag hydroxide-simeth, bisacodyl **OR** bisacodyl, diphenhydrAMINE **OR** diphenhydrAMINE, guaiFENesin-dextromethorphan, hydrOXYzine, magnesium hydroxide, naloxone **AND** sodium chloride flush, ondansetron (ZOFRAN) IV, ondansetron (ZOFRAN) IV, oxyCODONE, sodium chloride flush, sodium chloride flush, sodium chloride flush, traMADol    Assessment/Plan    1. Fever & leukocytosis -> MSSA bacteremia/ Subxiphoid Abscess  - bcx 2/2 MSSA - ID has changed antibiotics to cefazolin, now has PICC.   - WBC 24 -> 29->34K-> 25->20->16->13->15->13->12->21, mTemp 101.7 on 4/13. May need to re consult ID regarding abx.    - He is not a candidate for VAD exchange due to severe RV failure and noncompliance.  - S/p I&D on 4/5.  Wound cultures +MSSA.  - Back to OR 4/7 and 4/9  fror debridement and irrigation of driveline + packing, wound vac in place.    - Plan for return trip to OR later this week for wound vac change - ID recommends 6 weeks of IV cefazolin followed by long-term suppressive oral cephalexin. As noted above may need to re consult ID given rise in WBC and fever. - on PCA pump for pain control, still requiring doses but using less w/ constipation.   2.Acute on chronic systolic HF - EF 96% also has severe RV failure.  S/p HM-3 LVAD on 09/06/17 - On sildenafil for RV failure.  - Remains volume overloaded. CVP 15. Give additional IV Lasix today 60 mg.  - Co-ox 58%   3. HM3 LVAD 09/06/2017. - VAD interrogated personally. Parameters stable. - LDH 214 today  - INR 2.1, on warfarin per pharmacy - Off ASA with PUD.    4. Essential HTN - MAPs 80s - Continue hydralazine 75 mg tid. - Continue sildenafil 20 mg tid     5. AKI - Resolved.    6. OSA - sleep study with very severe OSA (AHI 69/hr) - non-compliant CPAP  7. H/o GI bleed  - 08/2018 EGD completed and showed gastritis and nonbleeding duodenal ulcers,  colonoscopy with 5 polyps removed. Protonix was increased to BID.  - No recent GI bleeding - hgb 9.3 - He will stay off aspirin for now.   8. Tobacco Abuse  - says he quit recently  46. Severe protein calorie malnutrition  - nutrition seeing  10. Constipation  - Abd plain film 4/12 unremarkable  - suspect related to pain meds - also h/o IBS  - can try adding sorbitol   Length of Stay: [address], PA-C  08/28/2019, 8:04 AM  Advanced Heart Failure Team Pager [phone] (M-F; 7a - 4p)  Please contact [facility] for night-coverage after hours (4p -7a ) and weekends on [email or web address] 8:04 AM   Patient seen with PA, agree with the above note.   Febrile, WBCs higher at 21.  Still volume overloaded on exam with CVP 15.  MAP 80s but HR 110s. CXR with pulmonary edema. AXR unremarkable.   LVAD parameters stable.   General: NAD HEENT: Normal. Neck: Supple, JVP 12-14 cm. Carotids OK.  Cardiac:  Mechanical heart sounds with LVAD hum present.  Lungs:  CTAB, normal effort.  Abdomen:  NT, moderately distended, no HSM. No bruits or masses. +BS  LVAD exit site: Site dressed. Extremities:  Warm and dry. No cyanosis, clubbing, rash. 1+ edema to knees.  Neuro:  Alert & oriented x 3. Cranial nerves grossly intact. Moves all 4 extremities w/o difficulty. Affect pleasant     Patient looks uncomfortable with distended abdomen.  Febrile and higher WBCs.  - Will broaden coverage to vancomycin and Zosyn and send blood cultures.  - Wound vac adjusted by Dr. Prescott Gum, now functioning.  - Plan to return to OR for re-exploration of chest site, currently scheduled Thursday but may need to go earlier if clinically worsens.  Will keep NPO at midnight.   Still volume overloaded with CVP 15, Lasix 80 mg IV bid and replace K.   Will transfer back to ICU.   CRITICAL CARE Performed by: Loralie Champagne  Total critical care time: 35 minutes  Critical care time was exclusive of separately  billable procedures and treating other patients.  Critical care was necessary to treat or prevent imminent or life-threatening  deterioration.  Critical care was time spent personally by me on the following activities: development of treatment plan with patient and/or surrogate as well as nursing, discussions with consultants, evaluation of patient's response to treatment, examination of patient, obtaining history from patient or surrogate, ordering and performing treatments and interventions, ordering and review of laboratory studies, ordering and review of radiographic studies, pulse oximetry and re-evaluation of patient's condition.  Marca Ancona 08/28/2019 2:28 PM

## 2019-08-28 NOTE — Progress Notes (Signed)
4 Days Post-Op Procedure(s) (LRB): Irrigation and Debridement with WOUND VAC APPLICATION (N/A) Application Of A-Cell Of Chest/Abdomen (N/A) Subjective: Patient developed fever and wound VAC drainage system plugged overnight. I remove the wound VAC sponge at the bedside using IV conscious moderate sedation and sterile technique. It appeared the ACell product liquefied and was occluding the suction line in the wound VAC system. I cleaned the wound out and repacked it with a Kerlix roll with sterile saline wet-to-dry.  Plan-Daily Kerlex saline wet-to-dry dressings to the wound.  Blood cultures have been obtained.  Broaden antibiotics with fever and increased white count. Return to the operating room in 48 hours for wound irrigation and further wound care under anesthesia.  INR today is 2.2 and reduction in Coumadin dose discussed with pharmacy team.  Objective: Vital signs in last 24 hours: Temp:  [98.3 F (36.8 C)-101.7 F (38.7 C)] 100.4 F (38 C) (04/13 1045) Pulse Rate:  [63-131] 63 (04/13 1045) Cardiac Rhythm: Sinus tachycardia (04/13 0329) Resp:  [17-34] 25 (04/13 1045) BP: (89-112)/(70-93) 98/70 (04/13 1045) SpO2:  [87 %-100 %] 97 % (04/13 1045) Weight:  [102.8 kg] 102.8 kg (04/13 0500)  Hemodynamic parameters for last 24 hours: CVP:  [9 mmHg-16 mmHg] 14 mmHg  Intake/Output from previous day: 04/12 0701 - 04/13 0700 In: 340 [P.O.:240; IV Piggyback:100] Out: 675 [Urine:625; Drains:50] Intake/Output this shift: Total I/O In: 120 [P.O.:120] Out: -   Exam Neuro intact Coarse breath sounds bilaterally Normal VAD hum Substernal wound packed personally today and examined.  Drainage appears to have been impaired by ACell product liquefying and occluding the wound VAC line.  Lab Results: Recent Labs    08/27/19 0446 08/28/19 0330  WBC 12.5* 21.1*  HGB 9.4* 9.3*  HCT 31.7* 31.6*  PLT 265 236   BMET:  Recent Labs    08/27/19 0446 08/28/19 0330  NA 136 134*  K 3.5  3.5  CL 92* 91*  CO2 33* 31  GLUCOSE 107* 118*  BUN 16 14  CREATININE 0.97 0.92  CALCIUM 8.9 8.7*    PT/INR:  Recent Labs    08/28/19 0330  LABPROT 23.3*  INR 2.1*   ABG    Component Value Date/Time   PHART 7.405 09/08/2017 0049   HCO3 23.4 09/08/2017 0049   TCO2 32 09/12/2017 1605   ACIDBASEDEF 1.0 09/08/2017 0049   O2SAT 58.0 08/28/2019 0350   CBG (last 3)  Recent Labs    08/27/19 0631  GLUCAP 157*    Assessment/Plan: S/P Procedure(s) (LRB): Irrigation and Debridement with WOUND VAC APPLICATION (N/A) Application Of A-Cell Of Chest/Abdomen (N/A) 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 Continue wound care with daily packing and IV antibiotics Reduce Coumadin dose and plan return to the operating room in 48 hours.   LOS: 12 days    Kathlee Nations Trigt III 08/28/2019

## 2019-08-28 NOTE — Progress Notes (Addendum)
ANTICOAGULATION CONSULT NOTE - Follow Up Consult  Pharmacy Consult for warfarin Indication: LVAD  No Known Allergies  Patient Measurements: Height: 6\' 2"  (188 cm) Weight: 102.8 kg (226 lb 10.1 oz) IBW/kg (Calculated) : 82.2  Vital Signs: Temp: 101 F (38.3 C) (04/13 0753) Temp Source: Oral (04/13 0753) BP: 112/85 (04/13 0753) Pulse Rate: 123 (04/13 0453)  Labs: Recent Labs    08/26/19 0345 08/26/19 0345 08/27/19 0446 08/28/19 0330  HGB 9.3*   < > 9.4* 9.3*  HCT 32.0*  --  31.7* 31.6*  PLT 287  --  265 236  LABPROT 20.7*  --  23.4* 23.3*  INR 1.8*  --  2.1* 2.1*  CREATININE 1.02  --  0.97 0.92   < > = values in this interval not displayed.    Estimated Creatinine Clearance: 128.3 mL/min (by C-G formula based on SCr of 0.92 mg/dL).   Medical History: Past Medical History:  Diagnosis Date  . Asthma   . CHF (congestive heart failure) (HCC)    a. 09/2016: EF 20-25% with cath showing normal cors  . GERD (gastroesophageal reflux disease)   . History of hiatal hernia   . OSA on CPAP 09/06/2018   Severe OSA with AHI 68/hr on CPAP at 12cm H2O    Assessment: 48yom with HF s/p LVAD implant HM3 place 4/19 admitted with drive line infection.    Warfarin PTA 7.5mg  MW and 5mg  all other days admit INR elevated at 5 - probably acute infection related supra-sensitive.    INR today is at goal. Hgb stable, pltc stable. LDH 214, fairly stable. No s/sx of bleeding. Off rifampin given on warfarin now.  Discussed with Dr. 5/19, wants INR ~ 1.5-1.7 for Thursday.  Goal of Therapy:  INR 2-2.5 Monitor platelets by anticoagulation protocol: Yes   Plan:  Warfarin 2 mg today Daily INR  Donata Clay, [facility] Clinical Pharmacist Phone [phone]  08/28/2019 8:52 AM   Please check [email or web address] for unit-specific pharmacist phone numbers

## 2019-08-28 NOTE — Progress Notes (Signed)
CSW received call from patient's mother with concerns as she was told by RN that patient will be transferred to ICU. CSW assisted with transferring call to VAD Coordinator Revonda Standard to update Mom on patient's condition. CSW visited patient at bedside who asked CSW "What is going on with me...Marland KitchenMarland Kitchenthey are moving me to ICU". Patient shared his belly feels bloated and had been given some lasix. CSW shared that patient's mother plans to visit this evening. Patient glad to have her visit and appeared comforted knowing she was visiting him. CSW offered support and will follow up with patient at bedside tomorrow. Lasandra Beech, LCSW, CCSW-MCS [phone]

## 2019-08-28 NOTE — Progress Notes (Signed)
[facility]  Date of Admission:  08/16/2019     Total days of antibiotics 13         ASSESSMENT:  Mr. Persky has new onset fever and leukocytosis over the last 24 hours. This is likely the sequale of continued infection with MSSA. He does have a PICC located in his right upper arm which does not appear infected at present although must be considered.  Antibiotic spectrum has been increased to vancomycin and cefepime pending planned surgical intervention. Repeat blood cultures have been drawn. Currently hemodynamically stable. Wound dressing changed by Dr. Prescott Gum today. Continue vancomycin and cefepime.   PLAN:  1. Continue vancomycin and cefepime.  2. Monitor cultures for presence of bacteremia.  3. Wound care and surgical intervention per CVTS. 4. Will need to restart 6 weeks once defeversced/surgery.   Principal Problem:   Infection associated with driveline of left ventricular assist device (LVAD) (HCC) Active Problems:   Elevated LFTs   LVAD (left ventricular assist device) present (HCC)   Normocytic anemia   Chronic combined systolic (congestive) and diastolic (congestive) heart failure (HCC)   OSA on CPAP   Sepsis (HCC)   Cigarette smoker   COPD (chronic obstructive pulmonary disease) (HCC)   Hypertension   Bacteremia due to methicillin susceptible Staphylococcus aureus (MSSA)   . bisacodyl  10 mg Oral Daily  . Chlorhexidine Gluconate Cloth  6 each Topical Daily  . docusate sodium  100 mg Oral TID  . DULoxetine  30 mg Oral Daily  . feeding supplement (ENSURE ENLIVE)  237 mL Oral TID BM  . fentaNYL (SUBLIMAZE) injection  25-100 mcg Intravenous Once  . ferrous sulfate  325 mg Oral Q breakfast  . gabapentin  300 mg Oral TID  . hydrALAZINE  75 mg Oral TID  . HYDROmorphone   Intravenous Q4H  . linaclotide  145 mcg Oral Daily  . mouth rinse  15 mL Mouth Rinse BID  . multivitamin with minerals  1 tablet Oral Daily  . pantoprazole  40 mg  Oral BID  . senna-docusate  1 tablet Oral QHS  . sildenafil  20 mg Oral TID  . sodium chloride flush  10-40 mL Intracatheter Q12H  . sodium chloride flush  10-40 mL Intracatheter Q12H  . sodium chloride flush  3 mL Intravenous Q12H  . sorbitol  60 mL Oral q morning - 10a  . torsemide  20 mg Oral QODAY  . warfarin  4 mg Oral ONCE-1600  . Warfarin - Pharmacist Dosing Inpatient   Does not apply q1600  . zinc sulfate  220 mg Oral Daily    SUBJECTIVE:  Mr. Zaccone was last seen by the ID service on 4/8 for MSSA infection complicating LVAD with bacteremia and abscess s/p I&D with wound vac placement. Blood cultures were clear and plan was for 6 weeks of Cefazolin followed by Cephalexin for chronic suppression. Starting a couple of days ago Mr. Mendibles began feeling worse and developed a fever of 101.7 yesterday. WBC count also increased from 12.5 which was down trending to 21.1. New new pain or worsening pain. CVTS with plans for OR tomorrow.  No Known Allergies   Review of Systems: Review of Systems  Constitutional: Positive for fever and malaise/fatigue. Negative for chills and weight loss.  Respiratory: Negative for cough, shortness of breath and wheezing.   Cardiovascular: Positive for chest pain. Negative for leg swelling.  Gastrointestinal: Negative for abdominal pain, constipation, diarrhea, nausea and vomiting.  Skin: Negative for rash.    OBJECTIVE: Vitals:   08/28/19 0453 08/28/19 0500 08/28/19 0753 08/28/19 0840  BP: 108/77  112/85   Pulse: (!) 123     Resp: (!) 27  (!) 21 (!) 21  Temp: 98.9 F (37.2 C)  (!) 101 F (38.3 C)   TempSrc: Oral  Oral   SpO2: 99%  98% 99%  Weight:  102.8 kg    Height:       Body mass index is 29.1 kg/m.  Physical Exam Constitutional:      General: He is not in acute distress.    Appearance: He is well-developed.  Cardiovascular:     Rate and Rhythm: Normal rate and regular rhythm.     Comments: LVAD hum; PICC line right upper  extremity; dressing clean and dry; site is without evidence of infection.  Pulmonary:     Effort: Pulmonary effort is normal.     Breath sounds: Normal breath sounds.  Skin:    General: Skin is warm and dry.  Neurological:     Mental Status: He is alert and oriented to person, place, and time.  Psychiatric:        Behavior: Behavior normal.        Thought Content: Thought content normal.        Judgment: Judgment normal.     Lab Results Lab Results  Component Value Date   WBC 21.1 (H) 08/28/2019   HGB 9.3 (L) 08/28/2019   HCT 31.6 (L) 08/28/2019   MCV 97.5 08/28/2019   PLT 236 08/28/2019    Lab Results  Component Value Date   CREATININE 0.92 08/28/2019   BUN 14 08/28/2019   NA 134 (L) 08/28/2019   K 3.5 08/28/2019   CL 91 (L) 08/28/2019   CO2 31 08/28/2019    Lab Results  Component Value Date   ALT 35 08/23/2019   AST 134 (H) 08/23/2019   ALKPHOS 177 (H) 08/23/2019   BILITOT 1.6 (H) 08/23/2019     Microbiology: Recent Results (from the past 240 hour(s))  Culture, blood (routine x 2)     Status: None   Collection Time: 08/18/19  2:55 PM   Specimen: BLOOD  Result Value Ref Range Status   Specimen Description BLOOD LEFT ANTECUBITAL  Final   Special Requests   Final    BOTTLES DRAWN AEROBIC AND ANAEROBIC Blood Culture adequate volume   Culture   Final    NO GROWTH 5 DAYS Performed at [facility], [address], [place], [place] [address]    Report Status 08/23/2019 FINAL  Final  Culture, blood (routine x 2)     Status: None   Collection Time: 08/18/19  3:01 PM   Specimen: BLOOD  Result Value Ref Range Status   Specimen Description BLOOD RIGHT ANTECUBITAL  Final   Special Requests   Final    BOTTLES DRAWN AEROBIC AND ANAEROBIC Blood Culture adequate volume   Culture   Final    NO GROWTH 5 DAYS Performed at [facility], [address], [place], [place] [address]    Report Status 08/23/2019 FINAL  Final  Fungus Culture With Stain     Status:  None (Preliminary result)   Collection Time: 08/20/19  3:18 PM   Specimen: PATH Other; Tissue  Result Value Ref Range Status   Fungus Stain Final report  Final    Comment: (NOTE) Performed At: [facility] [address] [place], [place] [id number] [place] Riley  Claudie Fisherman MD OI:[id number]    Fungus (Mycology) Culture PENDING  Incomplete   Fungal Source VAD DRIVELINE TUNNEL INFECTION  Final  Acid Fast Smear (AFB)     Status: None   Collection Time: 08/20/19  3:18 PM   Specimen: PATH Other; Tissue  Result Value Ref Range Status   AFB Specimen Processing Concentration  Final   Acid Fast Smear Negative  Final    Comment: (NOTE) Performed At: [facility] [address] [place], [place] [id number] Jolene Schimke MD QH:[id number]    Source (AFB) VAD DRIVELINE TUNNEL INFECTION  Final  Aerobic/Anaerobic Culture (surgical/deep wound)     Status: None   Collection Time: 08/20/19  3:18 PM   Specimen: Wound  Result Value Ref Range Status   Specimen Description WOUND VAD DRIVELINE TUNNEL INFECTION  Final   Special Requests SENT ON SWAB  Final   Gram Stain   Final    ABUNDANT WBC PRESENT,BOTH PMN AND MONONUCLEAR FEW GRAM POSITIVE COCCI    Culture   Final    FEW STAPHYLOCOCCUS AUREUS NO ANAEROBES ISOLATED Performed at [facility], [address], [place], [place] [address]    Report Status 08/25/2019 FINAL  Final   Organism ID, Bacteria STAPHYLOCOCCUS AUREUS  Final      Susceptibility   Staphylococcus aureus - MIC*    CIPROFLOXACIN <=0.5 SENSITIVE Sensitive     ERYTHROMYCIN <=0.25 SENSITIVE Sensitive     GENTAMICIN <=0.5 SENSITIVE Sensitive     OXACILLIN <=0.25 SENSITIVE Sensitive     TETRACYCLINE <=1 SENSITIVE Sensitive     VANCOMYCIN 1 SENSITIVE Sensitive     TRIMETH/SULFA <=10 SENSITIVE Sensitive     CLINDAMYCIN <=0.25 SENSITIVE Sensitive     RIFAMPIN <=0.5 SENSITIVE Sensitive     Inducible Clindamycin NEGATIVE Sensitive     * FEW STAPHYLOCOCCUS AUREUS  Fungus  Culture Result     Status: None   Collection Time: 08/20/19  3:18 PM  Result Value Ref Range Status   Result 1 Comment  Final    Comment: (NOTE) KOH/Calcofluor preparation:  no fungus observed. Performed At: [facility] [address] [place], [place] [id number] Jolene Schimke MD VC:[id number]      Marcos Eke, NP [facility]  08/28/2019  10:29 AM

## 2019-08-28 NOTE — Plan of Care (Signed)
  Problem: Education: Goal: Knowledge of General Education information will improve Description: Including pain rating scale, medication(s)/side effects and non-pharmacologic comfort measures Outcome: Progressing   Problem: Clinical Measurements: Goal: Ability to maintain clinical measurements within normal limits will improve Outcome: Progressing   Problem: Clinical Measurements: Goal: Diagnostic test results will improve Outcome: Progressing   Problem: Activity: Goal: Risk for activity intolerance will decrease Outcome: Progressing   Problem: Nutrition: Goal: Adequate nutrition will be maintained Outcome: Progressing   

## 2019-08-28 NOTE — Progress Notes (Signed)
LVAD Coordinator Rounding Note:  Admitted 08/16/19 by Dr. [name] due to VAD drive line infection/pneumonia/sepsis  HM III LVAD implanted on 09/06/17 by Dr. Cyndia Bent under Destination Therapy criteria.  Pt is complaining of belly pain. Pt is febrile, ST 130s, wound vac alarming blockage. Nurse states that night nurse said the wound vac alarmed blockage most of the night. VAD coordinator was not notified of any issues with the VAC overnight.    PVT called to the bedside to assist with pt care. BC x 2 , chest xray ordered. Pharmacy at bedside ordering antibiotics. PVT will change the wound vac today to a wet/dry dressing. Pt will go to the OR on Thursday morning.   Vital signs: Temp: 101 HR: 123 Doppler Pressure: 94 Automatic BP: 112/85 (95) O2 Sat: 98% on 2L Wt: 227.5>231>235>233.9>236.3>236.5>235.6>226.6  lbs   LVAD interrogation reveals:  Speed: 5600 Flow: 5.1 Power:  4.3w PI: 2.7 Alarms: none Events: none Hematocrit: 31  Fixed speed: 5600 Low speed limit: 5300  Drive Line: CDI. Twice weekly dressing changes using daily kit w/siliver strip by bedside nurse. Next dressing change 08/30/19.   Sternal wound: taken down by PVT, cleaned and packed with kerlix and covered with gauze and Ioban.  Labs:  LDH trend: 227>230>271>245>233>307>336>182>214  INR trend: 4.5>5.0>1.7>1.7>1.7>1.8>1.7>2.1  WBC: 27.3>26.2>24>34.6>25.8>20.8>16.9>13.0>12.5>21  Alk Phos: 138> 177  AST: 53> 154  ALT: 17> 35  Infection: - blood cultures 08/16/19>>staphylococcus aureus - blood cultures 08/18/19>> NGTD - Drive line tunnel / sternum 08/20/19>> abundant WBC, gram + cocci, culture- few staph aureus - Drive line AFB 4/8/301>> pending - Drive line/Sternal Fungus culture 08/20/19>> negative - blood cultures 08/28/19>>pending   Anticoagulation Plan: -INR Goal: 2.0 - 2.5  -ASA Dose: none  Adverse Events: - driveline cx 59/9/68>> staph aureus - Admitted for sternal/drive line infection. OR for  debridement 08/20/19- + staph aureus wound cultures   Plan/Recommendations:  1. Call VAD pager if any VAD equipment or drive line issues. 2. Twice weekly dressing changes by bedside nurse.   [name] RN [place] Coordinator  Office: [phone]  24/7 Pager: [phone]

## 2019-08-28 NOTE — Progress Notes (Signed)
Pharmacy Antibiotic Note  Jonathan Wood is a 49 y.o. male admitted on 08/16/2019 with known bacteremia (MSSA) and LVAD driveline infection.  Previously on cefazolin, but now with increasing WBC and febrile.  Wound VAC removed, planning return to OR on Thursday.  Pharmacy asked to broaden antibiotics to vancomycin and cefepime.  New cultures drawn.  Plan: Cefepime 2g IV q 8 hrs. Vancomycin 1750 mg IV x 1, then vancomycin 1250 mg q 8 hrs (AUC calculated ~ 500), hopefully can narrow quickly. F/u cultures, renal function and clinical course.  Height: 6\' 2"  (188 cm) Weight: 102.8 kg (226 lb 10.1 oz) IBW/kg (Calculated) : 82.2  Temp (24hrs), Avg:99.9 F (37.7 C), Min:98.4 F (36.9 C), Max:101.7 F (38.7 C)  Recent Labs  Lab 08/24/19 0359 08/25/19 0336 08/26/19 0345 08/27/19 0446 08/28/19 0330  WBC 13.0* 15.2* 13.8* 12.5* 21.1*  CREATININE 0.96 1.01 1.02 0.97 0.92    Estimated Creatinine Clearance: 128.3 mL/min (by C-G formula based on SCr of 0.92 mg/dL).    No Known Allergies  Antimicrobials this admission: Vancomcyin 4/1>>4/2 Cefepime 4/1>>4/2 Nafcillin 4/2>>4/3 Cefazolin 4/3>>(5/19) Rifampin 4/3>>4/7  Dose adjustments this admission:  Microbiology results: 4/1BCx: MSSA  4/1: BCID: MSSA 4/1 COVIDPCR:neg 4/3 BCx ngF 4/5 chest wound: MSSA  Thank you for allowing pharmacy to be a part of this patient's care.  6/3, [facility] Clinical Pharmacist Phone [phone]  08/28/2019 2:16 PM

## 2019-08-28 NOTE — Plan of Care (Deleted)
  Problem: Clinical Measurements: Goal: Diagnostic test results will improve 08/28/2019 0125 by Ashok Croon, RN Outcome: Progressing 08/28/2019 0125 by Ashok Croon, RN Outcome: Progressing   Problem: Pain Managment: Goal: General experience of comfort will improve 08/28/2019 0125 by Ashok Croon, RN Outcome: Progressing 08/28/2019 0125 by Ashok Croon, RN Outcome: Progressing   Problem: Safety: Goal: Ability to remain free from injury will improve 08/28/2019 0125 by Ashok Croon, RN Outcome: Progressing 08/28/2019 0125 by Ashok Croon, RN Outcome: Progressing   Problem: Education: Goal: Knowledge of General Education information will improve Description: Including pain rating scale, medication(s)/side effects and non-pharmacologic comfort measures 08/28/2019 0125 by Ashok Croon, RN Outcome: Progressing 08/28/2019 0125 by Ashok Croon, RN Outcome: Progressing

## 2019-08-28 NOTE — TOC Progression Note (Addendum)
Transition of Care [facility]) - Progression Note    Patient Details  Name: Jonathan Wood MRN: [id number] Date of Birth: January 26, 1971  [facility] of [facility] CM/SW Contact  Leone Haven, RN Phone Number: 08/28/2019, 9:22 AM  Clinical Narrative:    NCM left wound vac forms on chart to be signed and NCM also notified Amy with HF. Patient states he does not have a preference for [facility] agency from choice.   Patient will be set up with [facility] for iv abx.  [name] will be doing the medications and French Ana with [employer] is aware, once forms on chart is signed and measurements filled out will fax to [place] at [facility] so she can order the wound vac.  Please follow up with Tonye Becket NP  to make sure wound vac forms were signed , and then fax the forms to [place] with [employer] at [phone] and call to confirm with French Ana that she has received the forms.  Confirm with [name] that the [facility] with [name] is set up. He will also need a PCP apt set up for follow up at [facility] and [facility].  May have to have infectious Disease sign off on the [facility] orders while he is at home, ask [name] regarding this.  ( sometimes ID can sign off on the [facility] orders) if not then Dr. Darnelle Catalan will have to sign off until patient sees a MD at [facility] clinic.         Expected Discharge Plan and Services                                                 Social Determinants of Health (SDOH) Interventions    Readmission Risk Interventions No flowsheet data found.

## 2019-08-29 DIAGNOSIS — I5023 Acute on chronic systolic (congestive) heart failure: Secondary | ICD-10-CM | POA: Diagnosis not present

## 2019-08-29 DIAGNOSIS — A4101 Sepsis due to Methicillin susceptible Staphylococcus aureus: Secondary | ICD-10-CM | POA: Diagnosis not present

## 2019-08-29 DIAGNOSIS — Z20822 Contact with and (suspected) exposure to covid-19: Secondary | ICD-10-CM | POA: Diagnosis not present

## 2019-08-29 DIAGNOSIS — T827XXA Infection and inflammatory reaction due to other cardiac and vascular devices, implants and grafts, initial encounter: Secondary | ICD-10-CM | POA: Diagnosis not present

## 2019-08-29 LAB — COMPREHENSIVE METABOLIC PANEL
ALT: 13 U/L (ref 0–44)
AST: 33 U/L (ref 15–41)
Albumin: 2.1 g/dL — ABNORMAL LOW (ref 3.5–5.0)
Alkaline Phosphatase: 123 U/L (ref 38–126)
Anion gap: 11 (ref 5–15)
BUN: 16 mg/dL (ref 6–20)
CO2: 30 mmol/L (ref 22–32)
Calcium: 8 mg/dL — ABNORMAL LOW (ref 8.9–10.3)
Chloride: 94 mmol/L — ABNORMAL LOW (ref 98–111)
Creatinine, Ser: 0.87 mg/dL (ref 0.61–1.24)
GFR calc Af Amer: >60 mL/min (ref >60)
GFR calc non Af Amer: >60 mL/min (ref >60)
Glucose, Bld: 103 mg/dL — ABNORMAL HIGH (ref 70–99)
Potassium: 3.1 mmol/L — ABNORMAL LOW (ref 3.5–5.1)
Sodium: 135 mmol/L (ref 135–145)
Total Bilirubin: 3.2 mg/dL — ABNORMAL HIGH (ref 0.3–1.2)
Total Protein: 7 g/dL (ref 6.5–8.1)

## 2019-08-29 LAB — CBC
HCT: 28.1 % — ABNORMAL LOW (ref 39.0–52.0)
Hemoglobin: 8.3 g/dL — ABNORMAL LOW (ref 13.0–17.0)
MCH: 29.2 pg (ref 26.0–34.0)
MCHC: 29.5 g/dL — ABNORMAL LOW (ref 30.0–36.0)
MCV: 98.9 fL (ref 80.0–100.0)
Platelets: 188 10*3/uL (ref 150–400)
RBC: 2.84 MIL/uL — ABNORMAL LOW (ref 4.22–5.81)
RDW: 18.6 % — ABNORMAL HIGH (ref 11.5–15.5)
WBC: 24.8 10*3/uL — ABNORMAL HIGH (ref 4.0–10.5)
nRBC: 0 % (ref 0.0–0.2)

## 2019-08-29 LAB — COOXEMETRY PANEL
Carboxyhemoglobin: 3.8 % — ABNORMAL HIGH (ref 0.5–1.5)
Methemoglobin: 0.6 % (ref 0.0–1.5)
O2 Saturation: 58.5 %
Total hemoglobin: 8.4 g/dL — ABNORMAL LOW (ref 12.0–16.0)

## 2019-08-29 LAB — PREPARE RBC (CROSSMATCH)

## 2019-08-29 LAB — PROTIME-INR
INR: 3.4 — ABNORMAL HIGH (ref 0.8–1.2)
Prothrombin Time: 34.7 seconds — ABNORMAL HIGH (ref 11.4–15.2)

## 2019-08-29 LAB — MAGNESIUM: Magnesium: 1.6 mg/dL — ABNORMAL LOW (ref 1.7–2.4)

## 2019-08-29 LAB — LACTATE DEHYDROGENASE: LDH: 231 U/L — ABNORMAL HIGH (ref 98–192)

## 2019-08-29 MED ORDER — SORBITOL 70 % SOLN
60.0000 mL | Freq: Once | Status: AC
  Administered 2019-08-29: 10:00:00 60 mL via ORAL
  Filled 2019-08-29: qty 60

## 2019-08-29 MED ORDER — POTASSIUM CHLORIDE CRYS ER 20 MEQ PO TBCR
40.0000 meq | EXTENDED_RELEASE_TABLET | Freq: Once | ORAL | Status: AC
  Administered 2019-08-29: 18:00:00 40 meq via ORAL
  Filled 2019-08-29: qty 2

## 2019-08-29 MED ORDER — FENTANYL CITRATE (PF) 100 MCG/2ML IJ SOLN: 75.0000 ug | Freq: Once | INTRAMUSCULAR | Status: AC

## 2019-08-29 MED ORDER — FENTANYL CITRATE (PF) 100 MCG/2ML IJ SOLN
INTRAMUSCULAR | Status: AC
  Administered 2019-08-29: 09:00:00 75 ug via INTRAVENOUS
  Filled 2019-08-29: qty 2

## 2019-08-29 MED ORDER — PRO-STAT SUGAR FREE PO LIQD
30.0000 mL | Freq: Two times a day (BID) | ORAL | Status: DC
  Administered 2019-08-29 – 2019-09-04 (×4): 30 mL via ORAL
  Filled 2019-08-29 (×10): qty 30

## 2019-08-29 MED ORDER — MAGNESIUM SULFATE 4 GM/100ML IV SOLN
4.0000 g | Freq: Once | INTRAVENOUS | Status: AC
  Administered 2019-08-29: 16:00:00 4 g via INTRAVENOUS
  Filled 2019-08-29: qty 100

## 2019-08-29 MED ORDER — POTASSIUM CHLORIDE CRYS ER 20 MEQ PO TBCR
40.0000 meq | EXTENDED_RELEASE_TABLET | Freq: Once | ORAL | Status: AC
  Administered 2019-08-29: 10:00:00 40 meq via ORAL
  Filled 2019-08-29: qty 2

## 2019-08-29 MED ORDER — AMIODARONE HCL IN DEXTROSE 360-4.14 MG/200ML-% IV SOLN
30.0000 mg/h | INTRAVENOUS | Status: DC
  Administered 2019-08-29: 23:00:00 30 mg/h via INTRAVENOUS

## 2019-08-29 MED ORDER — POTASSIUM CHLORIDE CRYS ER 20 MEQ PO TBCR
40.0000 meq | EXTENDED_RELEASE_TABLET | Freq: Once | ORAL | Status: AC
  Administered 2019-08-29: 14:00:00 40 meq via ORAL
  Filled 2019-08-29: qty 2

## 2019-08-29 MED ORDER — METOLAZONE 2.5 MG PO TABS
2.5000 mg | ORAL_TABLET | Freq: Once | ORAL | Status: AC
  Administered 2019-08-29: 10:00:00 2.5 mg via ORAL
  Filled 2019-08-29: qty 1

## 2019-08-29 MED ORDER — ASCORBIC ACID 500 MG PO TABS
250.0000 mg | ORAL_TABLET | Freq: Every day | ORAL | Status: DC
  Administered 2019-08-29 – 2019-09-13 (×15): 250 mg via ORAL
  Filled 2019-08-29 (×14): qty 1

## 2019-08-29 MED ORDER — AMIODARONE HCL IN DEXTROSE 360-4.14 MG/200ML-% IV SOLN
60.0000 mg/h | INTRAVENOUS | Status: DC
  Administered 2019-08-29: 18:00:00 60 mg/h via INTRAVENOUS
  Filled 2019-08-29 (×2): qty 200

## 2019-08-29 NOTE — Progress Notes (Signed)
ANTICOAGULATION CONSULT NOTE - Follow Up Consult  Pharmacy Consult for warfarin Indication: LVAD  No Known Allergies  Patient Measurements: Height: 6\' 2"  (188 cm) Weight: 101.1 kg (222 lb 14.2 oz) IBW/kg (Calculated) : 82.2  Vital Signs: Temp: 97.6 F (36.4 C) (04/14 0801) Temp Source: Oral (04/14 0801) BP: 98/84 (04/14 0700) Pulse Rate: 146 (04/14 0600)  Labs: Recent Labs    08/27/19 0446 08/28/19 0330 08/29/19 0454  HGB 9.4* 9.3*  --   HCT 31.7* 31.6*  --   PLT 265 236  --   LABPROT 23.4* 23.3* 34.7*  INR 2.1* 2.1* 3.4*  CREATININE 0.97 0.92  --     Estimated Creatinine Clearance: 127.4 mL/min (by C-G formula based on SCr of 0.92 mg/dL).   Medical History: Past Medical History:  Diagnosis Date  . Asthma   . CHF (congestive heart failure) (HCC)    a. 09/2016: EF 20-25% with cath showing normal cors  . GERD (gastroesophageal reflux disease)   . History of hiatal hernia   . OSA on CPAP 09/06/2018   Severe OSA with AHI 68/hr on CPAP at 12cm H2O    Assessment: 48yom with HF s/p LVAD implant HM3 place 4/19 admitted with drive line infection.    Warfarin PTA 7.5mg  MW and 5mg  all other days admit INR elevated at 5 - probably acute infection related supra-sensitive.    INR today is above goal 3.4. CBC pending. LDH 231, fairly stable. No s/sx of bleeding. Off rifampin given on warfarin now.  Discussed with Dr. 5/19, wants INR ~ 1.5-1.7 for Thursday.  Goal of Therapy:  INR 2-2.5 Monitor platelets by anticoagulation protocol: Yes   Plan:  Hold warfarin tonight Daily INR  Donata Clay, PharmD, [facility] PGY2 Cardiology Pharmacy Resident Phone [phone] 08/29/2019       8:38 AM  Please check [email or web address] for unit-specific pharmacist phone numbers

## 2019-08-29 NOTE — Progress Notes (Signed)
[facility]  Date of Admission:  08/16/2019     Total days of antibiotics 14         ASSESSMENT:  Jonathan Wood has remained afebrile with mildly elevated temperatures overnight with continued leukocytosis. Feeling better today. Plans to go for repeat I&D tomorrow with Dr. Donata Clay. Renal function stable. Continue broad spectrum coverage with vancomycin and cefepime through surgery tomorrow and then narrow to Ancef pending culture results. Blood cultures from 4/13 are without growth to date.   PLAN:  1. Continue vancomycin and cefepime. 2. Wound care per CVTS 3. Monitor renal function and vancomycin levels for therapeutic drug monitoring.  4. I&D tomorrow.   Principal Problem:   Infection associated with driveline of left ventricular assist device (LVAD) (HCC) Active Problems:   Elevated LFTs   LVAD (left ventricular assist device) present (HCC)   Normocytic anemia   Chronic combined systolic (congestive) and diastolic (congestive) heart failure (HCC)   OSA on CPAP   Sepsis (HCC)   Cigarette smoker   COPD (chronic obstructive pulmonary disease) (HCC)   Hypertension   Bacteremia due to methicillin susceptible Staphylococcus aureus (MSSA)   . bisacodyl  10 mg Oral Daily  . Chlorhexidine Gluconate Cloth  6 each Topical Daily  . docusate sodium  100 mg Oral TID  . DULoxetine  30 mg Oral Daily  . feeding supplement (ENSURE ENLIVE)  237 mL Oral TID BM  . ferrous sulfate  325 mg Oral Q breakfast  . furosemide  80 mg Intravenous BID  . gabapentin  300 mg Oral TID  . hydrALAZINE  75 mg Oral TID  . HYDROmorphone   Intravenous Q4H  . linaclotide  145 mcg Oral Daily  . mouth rinse  15 mL Mouth Rinse BID  . multivitamin with minerals  1 tablet Oral Daily  . pantoprazole  40 mg Oral BID  . senna-docusate  1 tablet Oral QHS  . sildenafil  20 mg Oral TID  . sodium chloride flush  10-40 mL Intracatheter Q12H  . sodium chloride flush  10-40 mL  Intracatheter Q12H  . sodium chloride flush  3 mL Intravenous Q12H  . Warfarin - Pharmacist Dosing Inpatient   Does not apply q1600  . zinc sulfate  220 mg Oral Daily    SUBJECTIVE:  Fevers improved overnight with no acute events. Transferred to ICU. Feeling better today. Plans for I&D tomorrow.   No Known Allergies   Review of Systems: Review of Systems  Constitutional: Negative for chills, fever and weight loss.  Respiratory: Negative for cough, shortness of breath and wheezing.   Cardiovascular: Negative for chest pain and leg swelling.  Gastrointestinal: Negative for abdominal pain, constipation, diarrhea, nausea and vomiting.  Skin: Negative for rash.      OBJECTIVE: Vitals:   08/29/19 0800 08/29/19 0801 08/29/19 0900 08/29/19 1000  BP: (!) 107/96   108/80  Pulse:      Resp: (!) 27  20 (!) 26  Temp:  97.6 F (36.4 C)    TempSrc:  Oral    SpO2:   98%   Weight:      Height:       Body mass index is 28.62 kg/m.  Physical Exam Constitutional:      General: He is not in acute distress.    Appearance: He is well-developed.     Comments: Lying in bed with head of bed elevated; pleasant.   Cardiovascular:     Rate and  Rhythm: Normal rate and regular rhythm.     Comments: LVAD hum Pulmonary:     Effort: Pulmonary effort is normal.     Breath sounds: Normal breath sounds.  Skin:    General: Skin is warm and dry.  Neurological:     Mental Status: He is alert and oriented to person, place, and time.  Psychiatric:        Mood and Affect: Mood normal.     Lab Results Lab Results  Component Value Date   WBC 24.8 (H) 08/29/2019   HGB 8.3 (L) 08/29/2019   HCT 28.1 (L) 08/29/2019   MCV 98.9 08/29/2019   PLT 188 08/29/2019    Lab Results  Component Value Date   CREATININE 0.87 08/29/2019   BUN 16 08/29/2019   NA 135 08/29/2019   K 3.1 (L) 08/29/2019   CL 94 (L) 08/29/2019   CO2 30 08/29/2019    Lab Results  Component Value Date   ALT 13 08/29/2019    AST 33 08/29/2019   ALKPHOS 123 08/29/2019   BILITOT 3.2 (H) 08/29/2019     Microbiology: Recent Results (from the past 240 hour(s))  Fungus Culture With Stain     Status: None (Preliminary result)   Collection Time: 08/20/19  3:18 PM   Specimen: PATH Other; Tissue  Result Value Ref Range Status   Fungus Stain Final report  Final    Comment: (NOTE) Performed At: [facility] [address] [place], [place] [id number] Jolene Schimke MD [id number]    Fungus (Mycology) Culture PENDING  Incomplete   Fungal Source VAD DRIVELINE TUNNEL INFECTION  Final  Acid Fast Smear (AFB)     Status: None   Collection Time: 08/20/19  3:18 PM   Specimen: PATH Other; Tissue  Result Value Ref Range Status   AFB Specimen Processing Concentration  Final   Acid Fast Smear Negative  Final    Comment: (NOTE) Performed At: [facility] [address] [place], [place] [id number] Jolene Schimke MD [id number]    Source (AFB) VAD DRIVELINE TUNNEL INFECTION  Final  Aerobic/Anaerobic Culture (surgical/deep wound)     Status: None   Collection Time: 08/20/19  3:18 PM   Specimen: Wound  Result Value Ref Range Status   Specimen Description WOUND VAD DRIVELINE TUNNEL INFECTION  Final   Special Requests SENT ON SWAB  Final   Gram Stain   Final    ABUNDANT WBC PRESENT,BOTH PMN AND MONONUCLEAR FEW GRAM POSITIVE COCCI    Culture   Final    FEW STAPHYLOCOCCUS AUREUS NO ANAEROBES ISOLATED Performed at [facility], [address], [place], [place] [address]    Report Status 08/25/2019 FINAL  Final   Organism ID, Bacteria STAPHYLOCOCCUS AUREUS  Final      Susceptibility   Staphylococcus aureus - MIC*    CIPROFLOXACIN <=0.5 SENSITIVE Sensitive     ERYTHROMYCIN <=0.25 SENSITIVE Sensitive     GENTAMICIN <=0.5 SENSITIVE Sensitive     OXACILLIN <=0.25 SENSITIVE Sensitive     TETRACYCLINE <=1 SENSITIVE Sensitive     VANCOMYCIN 1 SENSITIVE Sensitive     TRIMETH/SULFA <=10 SENSITIVE  Sensitive     CLINDAMYCIN <=0.25 SENSITIVE Sensitive     RIFAMPIN <=0.5 SENSITIVE Sensitive     Inducible Clindamycin NEGATIVE Sensitive     * FEW STAPHYLOCOCCUS AUREUS  Fungus Culture Result     Status: None   Collection Time: 08/20/19  3:18 PM  Result Value Ref Range Status   Result  1 Comment  Final    Comment: (NOTE) KOH/Calcofluor preparation:  no fungus observed. Performed At: [facility] [place], [place] [id number] Rush Farmer MD DZ:[id number]   Culture, blood (routine x 2)     Status: None (Preliminary result)   Collection Time: 08/28/19 10:15 AM   Specimen: BLOOD  Result Value Ref Range Status   Specimen Description BLOOD LEFT ANTECUBITAL  Final   Special Requests   Final    BOTTLES DRAWN AEROBIC AND ANAEROBIC Blood Culture adequate volume   Culture   Final    NO GROWTH < 12 HOURS Performed at [facility], [address] [place], [place] [address]    Report Status PENDING  Incomplete  Culture, blood (routine x 2)     Status: None (Preliminary result)   Collection Time: 08/28/19 10:24 AM   Specimen: BLOOD LEFT HAND  Result Value Ref Range Status   Specimen Description BLOOD LEFT HAND  Final   Special Requests   Final    BOTTLES DRAWN AEROBIC AND ANAEROBIC Blood Culture adequate volume   Culture   Final    NO GROWTH < 12 HOURS Performed at [facility], [place] [address], [place], [place] [address]    Report Status PENDING  Incomplete  Body fluid culture     Status: None (Preliminary result)   Collection Time: 08/28/19 12:00 PM   Specimen: Body Fluid  Result Value Ref Range Status   Specimen Description FLUID  Final   Special Requests ABDOMEN  Final   Gram Stain   Final    FEW WBC PRESENT, PREDOMINANTLY PMN NO ORGANISMS SEEN    Culture   Final    NO GROWTH < 24 HOURS Performed at [facility], [address], [place], [place] [address]    Report Status PENDING  Incomplete     Terri Piedra, NP [place]  [facility]  08/29/2019  10:49 AM

## 2019-08-29 NOTE — Progress Notes (Signed)
6 mL of dilaudid wasted from PCA pump into stericycle. Witnessed by Sheppard Penton, RN.

## 2019-08-29 NOTE — Progress Notes (Signed)
LVAD Coordinator Rounding Note:  Admitted 08/16/19 by Dr. [name] due to VAD drive line infection/pneumonia/sepsis  HM III LVAD implanted on 09/06/17 by Dr. Cyndia Bent under Destination Therapy criteria.  Pt lying in bed. Complains of constipation. PVT at bedside for dressing change. See PVT note for description of packing/dressing sternal wound.   Vital signs: Temp: 99.8 HR: 114 Doppler Pressure: 86 Automatic BP: 107/96 (102) O2 Sat: 98% on 2L Wt: 227.5>231>235>233.9>236.3>236.5>235.6>226.6>222.8 lbs   LVAD interrogation reveals:  Speed: 5600 Flow: 5.1 Power:  4.2w PI: 2.9 Alarms: none Events: none Hematocrit: 28  Fixed speed: 5600 Low speed limit: 5300  Drive Line: CDI. Twice weekly dressing changes using daily kit w/siliver strip by bedside nurse. Next dressing change 08/30/19.   Sternal wound: taken down by PVT, cleaned and packed with kerlix and covered with gauze and Ioban.  Labs:  LDH trend: 227>230>271>245>233>307>336>182>214>231  INR trend: 4.5>5.0>1.7>1.7>1.7>1.8>1.7>2.1>3.4  WBC: 27.3>26.2>24>34.6>25.8>20.8>16.9>13.0>12.5>21>24.8  Alk Phos: 138> 177>123  AST: 53> 154>33  ALT: 17> 35>13  Infection: - blood cultures 08/16/19>>staphylococcus aureus - blood cultures 08/18/19>> NGTD - Drive line tunnel / sternum 08/20/19>> abundant WBC, gram + cocci, culture- few staph aureus - Drive line AFB 6/0/600>> pending - Drive line/Sternal Fungus culture 08/20/19>> negative - blood cultures 08/28/19>>pending   Anticoagulation Plan: -INR Goal: 2.0 - 2.5  -ASA Dose: none  Adverse Events: - 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 OR for debridement 08/20/19- + staph aureus wound cultures   Plan/Recommendations:  1. Call VAD pager if any VAD equipment or drive line issues. 2. Twice weekly dressing changes by bedside nurse.  3. VAD coordinator will accompany pt to OR tomorrow.  [name] RN [place] Coordinator  Office:  [phone]  24/7 Pager: [phone]

## 2019-08-29 NOTE — Progress Notes (Signed)
Nutrition Follow-up  DOCUMENTATION CODES:   Not applicable  INTERVENTION:   Continue Ensure Enlive po TID, each supplement provides 350 kcal and 20 grams of protein  Add 30 ml Prostat BID, each supplement provides 100 kcals and 15 grams protein.   Cater to pt preferences; added to HealthTouch system  Consider liberalizing diet to Regular  Add Vitamin C 250 mg BID  NUTRITION DIAGNOSIS:   Inadequate oral intake related to decreased appetite as evidenced by per patient/family report.  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 PMH includes NICM with EF 10%, LVAD, HTN, EtOH abuse  4/05 S/p I&D w/ excisional debridement of subxiphoid abscess. Cultures + Staph Aureus  4/07 Returned to OR for debridement, Irrigation and Packing of Abdominal Incision  4/09 S/P I&D abd wound with Acell and VAC application.   Noted plan to return to OR tomorrow for further debridement  Pt reports poor appetite. Recorded po intake 0-80% of meals; recorded po intake 35%. Pt reports he likes the Ensure shakes and is drinking around 2 per day. Pt is unsure if he can drink more than 2 per day. Reinforced the importance of increasing protein intake and adequate overall nutrition with regards to wound healing; pt verbalized understanding.  Discussed alternative supplements with patient; pt does not want to try Magic Cup but agreeable to Pro-Stat supplement.   Reviewed food preferences and pt provided Clinical research associate with food options that sound good to him; preferences placed in HealthTouch system  Pt with constipation; abd xray on 4/12 unremarkable  Labs:  potassium 3.1 (L), magnesium 1.6 (L) Meds: ferrous sulfate, lasix, MVI with Minerals, KCl, zinc sulfate  Diet Order:   Diet Order            Diet NPO time specified  Diet effective midnight        Diet Heart Room service appropriate? Yes; Fluid consistency: Thin  Diet effective now              EDUCATION NEEDS:   Not appropriate for education at this time  Skin:  Skin Assessment: Skin Integrity Issues: Skin Integrity Issues:: Incisions Incisions: abdomen, chest  Last BM:  4/6  Height:   Ht Readings from Last 1 Encounters:  08/17/19 6\' 2"  (1.88 m)    Weight:   Wt Readings from Last 1 Encounters:  08/29/19 101.1 kg    Ideal Body Weight:     BMI:  Body mass index is 28.62 kg/m.  Estimated Nutritional Needs:   Kcal:  2200-2400 kcals  Protein:  115-130 g  Fluid:  >/= 1.8 L    08/31/19 MS, RDN, LDN, CNSC RD Pager Number and Weekend/On-Call After Hours Pager Located in [place]

## 2019-08-29 NOTE — Progress Notes (Addendum)
Patient ID: Jonathan Wood, male   DOB: July 22, 1970, 49 y.o.   MRN: [id number]     Advanced Heart Failure Rounding Note  PCP-Cardiologist: Arvilla Meres, MD    Patient Profile   49 y.o. 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  Subjective:    - 4/5 S/p I&D w/ excisional debridement of subxiphoid abscess. Cultures + Staph Aureus  - 4/7, Returned to OR for debridement, Irrigation and Packing of Abdominal Incision - 4/9 S/P I&D abd wound with Acell and VAC application.   Blood cultures positive for MSSA.  On cefazolin, has PICC in place.  CVP 15.    Co-ox 58.5%     Denies SOB. Feels a little better today. No BM in 48 hours.   VAD Interrogation: Speed:5600 Flow:51 Power:4.3  PI:2.9   No PI events  CT of C/A/P 08/16/19 IMPRESSION: 1. New fluid collections along the efferent tubing from the VAD to the ascending aorta. Findings are suspicious for infection given clinical presentation and elevated white blood cell count. 2. Stable fluid surrounding the line within the left upper quadrant anterior abdominal wall, with decreased adjacent fat stranding. Infection in this region cannot be excluded. 3. Stable cardiomegaly. 4. Stable emphysema. 5. Otherwise no acute intra-abdominal or intrapelvic process.  Objective:   Weight Range: 101.1 kg Body mass index is 28.62 kg/m.   Vital Signs:   Temp:  [97.6 F (36.4 C)-100.4 F (38 C)] 97.6 F (36.4 C) (04/14 0801) Pulse Rate:  [52-173] 146 (04/14 0600) Resp:  [15-34] 24 (04/14 0700) BP: (79-104)/(54-84) 98/84 (04/14 0700) SpO2:  [93 %-100 %] 99 % (04/14 0600) Weight:  [101.1 kg] 101.1 kg (04/14 0500) Last BM Date: 08/28/19  Weight change: Filed Weights   08/27/19 0500 08/28/19 0500 08/29/19 0500  Weight: 103.7 kg 102.8 kg 101.1 kg    Intake/Output:    Intake/Output Summary (Last 24 hours) at 08/29/2019 0807 Last data filed at 08/29/2019 0700 Gross per 24 hour  Intake 2015.52 ml  Output 1550 ml  Net 465.52 ml    Telemetry: Sinus Tach 120s   Physical Exam   Physical Exam: CVP 15  GENERAL: NAD HEENT: normal  NECK: Supple, JVP 9-10   .  2+ bilaterally, no bruits.  No lymphadenopathy or thyromegaly appreciated.   CARDIAC:  Mechanical heart sounds with LVAD hum present.  LUNGS:  Coarse on the right  ABDOMEN:  Distended, round, nontender, positive bowel sounds x4.     LVAD exit site: Dressing CDI  EXTREMITIES:  Warm and dry, no cyanosis, clubbing, rash or edema  NEUROLOGIC:  Alert and oriented x 3.   No aphasia.  No dysarthria.  Affect pleasant.      Labs    CBC Recent Labs    08/27/19 0446 08/28/19 0330  WBC 12.5* 21.1*  HGB 9.4* 9.3*  HCT 31.7* 31.6*  MCV 96.9 97.5  PLT 265 236   Basic Metabolic Panel Recent Labs    30/09/23 0446 08/28/19 0330  NA 136 134*  K 3.5 3.5  CL 92* 91*  CO2 33* 31  GLUCOSE 107* 118*  BUN 16 14  CREATININE 0.97 0.92  CALCIUM 8.9 8.7*  MG 1.7  --    Liver Function Tests No results for input(s): AST, ALT, ALKPHOS, BILITOT, PROT, ALBUMIN in the last 72 hours. No results for input(s): LIPASE, AMYLASE in  the last 72 hours. Cardiac Enzymes No results for input(s): CKTOTAL, CKMB, CKMBINDEX, TROPONINI in the last 72 hours.  BNP: BNP (last 3 results) Recent Labs    03/21/19 1415 04/23/19 1350  BNP 426.9* 709.6*    ProBNP (last 3 results) No results for input(s): PROBNP in the last 8760 hours.   D-Dimer No results for input(s): DDIMER in the last 72 hours. Hemoglobin A1C No results for input(s): HGBA1C in the last 72 hours. Fasting Lipid Panel No results for input(s): CHOL, HDL, LDLCALC, TRIG, CHOLHDL, LDLDIRECT in the last 72 hours. Thyroid Function Tests No results for input(s): TSH, T4TOTAL, T3FREE, THYROIDAB in the last 72 hours.  Invalid input(s): FREET3  Other  results:   Imaging    DG CHEST PORT 1 VIEW  Result Date: 08/28/2019 CLINICAL DATA:  Shortness of breath.  Chest pain. EXAM: PORTABLE CHEST 1 VIEW COMPARISON:  08/22/2019. FINDINGS: Interim removal of right IJ line. Right PICC line noted with tip at cavoatrial junction. Prior median sternotomy. Left ventricular assist device in stable position. Stable cardiomegaly. Mild bilateral pulmonary infiltrates/edema. Small left pleural effusion again noted. No pneumothorax. IMPRESSION: 1. Interim removal of right IJ line. Right PICC line noted with tip at cavoatrial junction. 2. Prior median sternotomy. Left ventricular assist device in stable position. Stable cardiomegaly. Mild bilateral pulmonary interstitial infiltrates/edema. Small left pleural effusion again noted. Electronically Signed   By: Maisie Fus  Register   On: 08/28/2019 10:33     Medications:     Scheduled Medications: . bisacodyl  10 mg Oral Daily  . Chlorhexidine Gluconate Cloth  6 each Topical Daily  . docusate sodium  100 mg Oral TID  . DULoxetine  30 mg Oral Daily  . feeding supplement (ENSURE ENLIVE)  237 mL Oral TID BM  . ferrous sulfate  325 mg Oral Q breakfast  . furosemide  80 mg Intravenous BID  . gabapentin  300 mg Oral TID  . hydrALAZINE  75 mg Oral TID  . HYDROmorphone   Intravenous Q4H  . linaclotide  145 mcg Oral Daily  . mouth rinse  15 mL Mouth Rinse BID  . multivitamin with minerals  1 tablet Oral Daily  . pantoprazole  40 mg Oral BID  . senna-docusate  1 tablet Oral QHS  . sildenafil  20 mg Oral TID  . sodium chloride flush  10-40 mL Intracatheter Q12H  . sodium chloride flush  10-40 mL Intracatheter Q12H  . sodium chloride flush  3 mL Intravenous Q12H  . sorbitol  60 mL Oral q morning - 10a  . Warfarin - Pharmacist Dosing Inpatient   Does not apply q1600  . zinc sulfate  220 mg Oral Daily    Infusions: . sodium chloride 10 mL/hr at 08/29/19 0700  . ceFEPime (MAXIPIME) IV Stopped (08/29/19 0540)  .  dextrose 5 % and 0.45% NaCl Stopped (08/25/19 1414)  . lactated ringers    . phenylephrine (NEO-SYNEPHRINE) Adult infusion    . vancomycin 166.7 mL/hr at 08/29/19 0700    PRN Medications: sodium chloride, acetaminophen, albuterol, alum & mag hydroxide-simeth, bisacodyl **OR** bisacodyl, diphenhydrAMINE **OR** diphenhydrAMINE, guaiFENesin-dextromethorphan, hydrOXYzine, magnesium hydroxide, naloxone **AND** sodium chloride flush, ondansetron (ZOFRAN) IV, ondansetron (ZOFRAN) IV, oxyCODONE, sodium chloride flush, sodium chloride flush, sodium chloride flush, traMADol    Assessment/Plan    1. Fever & leukocytosis -> MSSA bacteremia/ Subxiphoid Abscess  - bcx 2/2 MSSA - ID has changed antibiotics to cefazolin, now has PICC.   - CBC pending.  -  May  need to re consult ID regarding abx.    - He is not a candidate for VAD exchange due to severe RV failure and noncompliance.  - S/p I&D on 4/5. Wound cultures +MSSA.  - Back to OR 4/7 and 4/9  fror debridement and irrigation of driveline + packing, wound vac in place.    - Plan for return trip to OR tomorrow.  -  ID recommends 6 weeks of IV cefazolin followed by long-term suppressive oral cephalexin. However, with high fever on 4/13, abx broadened to vancomycin/cefepime.  - on PCA pump for pain control, still requiring doses but using less w/ constipation.   2.Acute on chronic systolic HF - EF 51% also has severe RV failure.  S/p HM-3 LVAD on 09/06/17 - CVP 9-10  - On sildenafil for RV failure.  - Co-ox 58%   3. HM3 LVAD 09/06/2017. - VAD interrogated personally. Parameters stable. - LDH 231  today  - INR 3.4 , on warfarin per pharmacy - Off ASA with PUD.    4. Essential HTN - Continue hydralazine 75 mg tid. - Continue sildenafil 20 mg tid     5. AKI - BMET pending    6. OSA - sleep study with very severe OSA (AHI 69/hr) - non-compliant CPAP  7. H/o GI bleed  - 08/2018 EGD completed and showed gastritis and nonbleeding  duodenal ulcers, colonoscopy with 5 polyps removed. Protonix was increased to BID.  - No recent GI bleeding - hgb 9.3 - He will stay off aspirin for now.   8. Tobacco Abuse  - says he quit recently  9. Severe protein calorie malnutrition  - nutrition seeing -Check albumin today.   10. Constipation  - Abd plain film 4/12 unremarkable  - suspect related to pain meds - also h/o IBS  - Give sorbitol today.   CBC, CMET pending.    Length of Stay: 13  Amy Filbert Schilder, NP  08/29/2019, 8:07 AM  Advanced Heart Failure Team Pager [phone] (M-F; 7a - 4p)  Please contact [facility] for night-coverage after hours (4p -7a ) and weekends on [email or web address] 8:07 AM   Patient seen with NP, agree with the above note.   Feeling better this morning, afebrile.  CBC and BMET not back yet.  Co-ox 58.5% with CVP 15.   LVAD parameters stable.   General: Well appearing this am. NAD.  HEENT: Normal. Neck: Supple, JVP 14 cm. Carotids OK.  Cardiac:  Mechanical heart sounds with LVAD hum present.  Lungs:  CTAB, normal effort.  Abdomen:  NT, moderate distention, no HSM. No bruits or masses. +BS  LVAD exit site: Dressed. Extremities:  Warm and dry. No cyanosis, clubbing, rash, or edema.  Neuro:  Alert & oriented x 3. Cranial nerves grossly intact. Moves all 4 extremities w/o difficulty. Affect pleasant     More comfortable this morning, afebrile now.  Abx broadened yesterday and cultures resent.  Initial blood cultures/wound cultures with MSSA. Now on vancomycin/cefepime.  - Plan to return to OR for re-exploration of chest site tomorrow.   Still volume overloaded with CVP 15-16, Lasix 80 mg IV bid to continue and will give dose of metolazone.  BMET pending.  Replace K.    Mildly tachy, ECG to assess rhythm.    CRITICAL CARE Performed by: Marca Ancona  Total critical care time: 35 minutes  Critical care time was exclusive of separately billable procedures and treating other patients.   Critical care was necessary to treat or prevent imminent  or life-threatening deterioration.  Critical care was time spent personally by me on the following activities: development of treatment plan with patient and/or surrogate as well as nursing, discussions with consultants, evaluation of patient's response to treatment, examination of patient, obtaining history from patient or surrogate, ordering and performing treatments and interventions, ordering and review of laboratory studies, ordering and review of radiographic studies, pulse oximetry and re-evaluation of patient's condition.  Loralie Champagne 08/29/2019 9:20 AM

## 2019-08-29 NOTE — Progress Notes (Signed)
5 Days Post-Op Procedure(s) (LRB): Irrigation and Debridement with WOUND VAC APPLICATION (N/A) Application Of A-Cell Of Chest/Abdomen (N/A) Subjective: Subxiphoid abdominal wall wound repacked personally.  Granulation tissue has improved in the subcutaneous fat. Minimal granulation tissue at the depth of the wound.  Bend  relief over the outflow graft still exposed.  The extension of the driveline tunnel into the left upper quadrant has less drainage and was packed with sterile Kerlix gauze saline wet-to-dry.  Wound cultures remain pending.  Plan return to the OR tomorrow for deep wound debridement, Masonic jet irrigation and repacking versus wound VAC without ACell.   Objective: Vital signs in last 24 hours: Temp:  [97.6 F (36.4 C)-100.4 F (38 C)] 97.6 F (36.4 C) (04/14 0801) Pulse Rate:  [52-173] 146 (04/14 0600) Cardiac Rhythm: Sinus tachycardia (04/14 0500) Resp:  [15-34] 24 (04/14 0700) BP: (79-104)/(54-84) 98/84 (04/14 0700) SpO2:  [93 %-100 %] 99 % (04/14 0600) Weight:  [101.1 kg] 101.1 kg (04/14 0500)  Hemodynamic parameters for last 24 hours: CVP:  [10 mmHg-15 mmHg] 10 mmHg  Intake/Output from previous day: 04/13 0701 - 04/14 0700 In: 2015.5 [P.O.:360; I.V.:648.8; IV Piggyback:1006.7] Out: 1550 [Urine:1550] Intake/Output this shift: No intake/output data recorded.  Exam Patient appears more comfortable less tachypneic today, less tachycardic Lungs clear Normal VAD hum Subxiphoid wound examined as described above  Lab Results: Recent Labs    08/27/19 0446 08/28/19 0330  WBC 12.5* 21.1*  HGB 9.4* 9.3*  HCT 31.7* 31.6*  PLT 265 236   BMET:  Recent Labs    08/27/19 0446 08/28/19 0330  NA 136 134*  K 3.5 3.5  CL 92* 91*  CO2 33* 31  GLUCOSE 107* 118*  BUN 16 14  CREATININE 0.97 0.92  CALCIUM 8.9 8.7*    PT/INR:  Recent Labs    08/29/19 0454  LABPROT 34.7*  INR 3.4*   ABG    Component Value Date/Time   PHART 7.405 09/08/2017 0049   HCO3  23.4 09/08/2017 0049   TCO2 32 09/12/2017 1605   ACIDBASEDEF 1.0 09/08/2017 0049   O2SAT 58.5 08/29/2019 0515   CBG (last 3)  Recent Labs    08/27/19 0631  GLUCAP 157*    Assessment/Plan: S/P Procedure(s) (LRB): Irrigation and Debridement with WOUND VAC APPLICATION (N/A) Application Of A-Cell Of Chest/Abdomen (N/A) Continue IV antibiotics and wound care. Return to the OR in a.m. for wound debridement irrigation and repacking versus wound VAC   LOS: 13 days    Kathlee Nations Trigt III 08/29/2019

## 2019-08-29 NOTE — Progress Notes (Signed)
EKG CRITICAL VALUE     12 lead EKG performed.  Critical value noted.  Pollyann Glen, RN notified.   Tyler Robidoux A, CCT 08/29/2019 9:41 AM

## 2019-08-30 ENCOUNTER — Inpatient Hospital Stay (HOSPITAL_COMMUNITY): Payer: Medicare Other | Admitting: Anesthesiology

## 2019-08-30 ENCOUNTER — Encounter (HOSPITAL_COMMUNITY): Payer: Self-pay | Admitting: Internal Medicine

## 2019-08-30 ENCOUNTER — Encounter (HOSPITAL_COMMUNITY)
Admission: EM | Disposition: A | Discharge status: Home Health | Payer: Self-pay | Source: Ambulatory Visit | Attending: Internal Medicine

## 2019-08-30 DIAGNOSIS — I9789 Other postprocedural complications and disorders of the circulatory system, not elsewhere classified: Secondary | ICD-10-CM

## 2019-08-30 DIAGNOSIS — I5023 Acute on chronic systolic (congestive) heart failure: Secondary | ICD-10-CM | POA: Diagnosis not present

## 2019-08-30 DIAGNOSIS — Z20822 Contact with and (suspected) exposure to covid-19: Secondary | ICD-10-CM | POA: Diagnosis not present

## 2019-08-30 DIAGNOSIS — A4101 Sepsis due to Methicillin susceptible Staphylococcus aureus: Secondary | ICD-10-CM | POA: Diagnosis not present

## 2019-08-30 DIAGNOSIS — T827XXA Infection and inflammatory reaction due to other cardiac and vascular devices, implants and grafts, initial encounter: Secondary | ICD-10-CM | POA: Diagnosis not present

## 2019-08-30 HISTORY — PX: APPLICATION OF WOUND VAC: SHX5189

## 2019-08-30 HISTORY — PX: WOUND DEBRIDEMENT: SHX247

## 2019-08-30 LAB — BASIC METABOLIC PANEL
Anion gap: 12 (ref 5–15)
BUN: 26 mg/dL — ABNORMAL HIGH (ref 6–20)
CO2: 31 mmol/L (ref 22–32)
Calcium: 8.4 mg/dL — ABNORMAL LOW (ref 8.9–10.3)
Chloride: 90 mmol/L — ABNORMAL LOW (ref 98–111)
Creatinine, Ser: 1.09 mg/dL (ref 0.61–1.24)
GFR calc Af Amer: >60 mL/min (ref >60)
GFR calc non Af Amer: >60 mL/min (ref >60)
Glucose, Bld: 124 mg/dL — ABNORMAL HIGH (ref 70–99)
Potassium: 3.4 mmol/L — ABNORMAL LOW (ref 3.5–5.1)
Sodium: 133 mmol/L — ABNORMAL LOW (ref 135–145)

## 2019-08-30 LAB — CBC WITH DIFFERENTIAL/PLATELET
Abs Immature Granulocytes: 0.16 10*3/uL — ABNORMAL HIGH (ref 0.00–0.07)
Basophils Absolute: 0.1 10*3/uL (ref 0.0–0.1)
Basophils Relative: 0 %
Eosinophils Absolute: 0.1 10*3/uL (ref 0.0–0.5)
Eosinophils Relative: 1 %
HCT: 28.1 % — ABNORMAL LOW (ref 39.0–52.0)
Hemoglobin: 8.4 g/dL — ABNORMAL LOW (ref 13.0–17.0)
Immature Granulocytes: 1 %
Lymphocytes Relative: 6 %
Lymphs Abs: 1.2 10*3/uL (ref 0.7–4.0)
MCH: 29.1 pg (ref 26.0–34.0)
MCHC: 29.9 g/dL — ABNORMAL LOW (ref 30.0–36.0)
MCV: 97.2 fL (ref 80.0–100.0)
Monocytes Absolute: 1.8 10*3/uL — ABNORMAL HIGH (ref 0.1–1.0)
Monocytes Relative: 9 %
Neutro Abs: 16.3 10*3/uL — ABNORMAL HIGH (ref 1.7–7.7)
Neutrophils Relative %: 83 %
Platelets: 204 10*3/uL (ref 150–400)
RBC: 2.89 MIL/uL — ABNORMAL LOW (ref 4.22–5.81)
RDW: 18 % — ABNORMAL HIGH (ref 11.5–15.5)
WBC: 19.6 10*3/uL — ABNORMAL HIGH (ref 4.0–10.5)
nRBC: 0 % (ref 0.0–0.2)

## 2019-08-30 LAB — PROTIME-INR
INR: 2.5 — ABNORMAL HIGH (ref 0.8–1.2)
Prothrombin Time: 27.3 seconds — ABNORMAL HIGH (ref 11.4–15.2)

## 2019-08-30 LAB — COOXEMETRY PANEL
Carboxyhemoglobin: 2.5 % — ABNORMAL HIGH (ref 0.5–1.5)
Methemoglobin: 0.6 % (ref 0.0–1.5)
O2 Saturation: 57 %
Total hemoglobin: 9.7 g/dL — ABNORMAL LOW (ref 12.0–16.0)

## 2019-08-30 LAB — LACTATE DEHYDROGENASE: LDH: 208 U/L — ABNORMAL HIGH (ref 98–192)

## 2019-08-30 LAB — MAGNESIUM: Magnesium: 2.1 mg/dL (ref 1.7–2.4)

## 2019-08-30 SURGERY — APPLICATION, WOUND VAC
Anesthesia: General | Site: Abdomen

## 2019-08-30 MED ORDER — LACTATED RINGERS IV SOLN: INTRAVENOUS | Status: DC | PRN

## 2019-08-30 MED ORDER — PHENYLEPHRINE 40 MCG/ML (10ML) SYRINGE FOR IV PUSH (FOR BLOOD PRESSURE SUPPORT)
PREFILLED_SYRINGE | INTRAVENOUS | Status: AC
  Filled 2019-08-30: qty 10

## 2019-08-30 MED ORDER — ONDANSETRON HCL 4 MG/2ML IJ SOLN
INTRAMUSCULAR | Status: DC | PRN
  Administered 2019-08-30: 4 mg via INTRAVENOUS

## 2019-08-30 MED ORDER — ROCURONIUM BROMIDE 10 MG/ML (PF) SYRINGE
PREFILLED_SYRINGE | INTRAVENOUS | Status: AC
  Filled 2019-08-30: qty 10

## 2019-08-30 MED ORDER — PROPOFOL 10 MG/ML IV BOLUS
INTRAVENOUS | Status: AC
  Filled 2019-08-30: qty 20

## 2019-08-30 MED ORDER — LIDOCAINE 2% (20 MG/ML) 5 ML SYRINGE
INTRAMUSCULAR | Status: DC | PRN
  Administered 2019-08-30: 50 mg via INTRAVENOUS

## 2019-08-30 MED ORDER — SODIUM CHLORIDE 0.9 % IR SOLN
Status: DC | PRN
  Administered 2019-08-30: 1000 mL

## 2019-08-30 MED ORDER — PHENYLEPHRINE HCL-NACL 10-0.9 MG/250ML-% IV SOLN
INTRAVENOUS | Status: DC | PRN
  Administered 2019-08-30: 25 ug/min via INTRAVENOUS

## 2019-08-30 MED ORDER — POTASSIUM CHLORIDE CRYS ER 20 MEQ PO TBCR
40.0000 meq | EXTENDED_RELEASE_TABLET | Freq: Once | ORAL | Status: AC
  Administered 2019-08-30: 06:00:00 40 meq via ORAL
  Filled 2019-08-30: qty 2

## 2019-08-30 MED ORDER — ONDANSETRON HCL 4 MG/2ML IJ SOLN
INTRAMUSCULAR | Status: AC
  Filled 2019-08-30: qty 2

## 2019-08-30 MED ORDER — MIDAZOLAM HCL 5 MG/5ML IJ SOLN
INTRAMUSCULAR | Status: DC | PRN
  Administered 2019-08-30: 2 mg via INTRAVENOUS

## 2019-08-30 MED ORDER — ETOMIDATE 2 MG/ML IV SOLN
INTRAVENOUS | Status: DC | PRN
  Administered 2019-08-30: 20 mg via INTRAVENOUS

## 2019-08-30 MED ORDER — VANCOMYCIN HCL 1250 MG/250ML IV SOLN
1250.0000 mg | Freq: Three times a day (TID) | INTRAVENOUS | Status: DC
  Administered 2019-08-31 (×2): 1250 mg via INTRAVENOUS
  Filled 2019-08-30 (×2): qty 250

## 2019-08-30 MED ORDER — METOLAZONE 2.5 MG PO TABS
2.5000 mg | ORAL_TABLET | Freq: Once | ORAL | Status: AC
  Administered 2019-08-30: 17:00:00 2.5 mg via ORAL
  Filled 2019-08-30: qty 1

## 2019-08-30 MED ORDER — DEXAMETHASONE SODIUM PHOSPHATE 10 MG/ML IJ SOLN
INTRAMUSCULAR | Status: DC | PRN
  Administered 2019-08-30: 10 mg via INTRAVENOUS

## 2019-08-30 MED ORDER — DEXAMETHASONE SODIUM PHOSPHATE 10 MG/ML IJ SOLN
INTRAMUSCULAR | Status: AC
  Filled 2019-08-30: qty 1

## 2019-08-30 MED ORDER — LIDOCAINE 2% (20 MG/ML) 5 ML SYRINGE
INTRAMUSCULAR | Status: AC
  Filled 2019-08-30: qty 5

## 2019-08-30 MED ORDER — AMIODARONE HCL 200 MG PO TABS
200.0000 mg | ORAL_TABLET | Freq: Two times a day (BID) | ORAL | Status: DC
  Administered 2019-08-30 – 2019-09-13 (×29): 200 mg via ORAL
  Filled 2019-08-30 (×29): qty 1

## 2019-08-30 MED ORDER — ALBUMIN HUMAN 5 % IV SOLN: INTRAVENOUS | Status: DC | PRN

## 2019-08-30 MED ORDER — HEMOSTATIC AGENTS (NO CHARGE) OPTIME
TOPICAL | Status: DC | PRN
  Administered 2019-08-30: 1 via TOPICAL

## 2019-08-30 MED ORDER — SUGAMMADEX SODIUM 200 MG/2ML IV SOLN
INTRAVENOUS | Status: DC | PRN
  Administered 2019-08-30: 200 mg via INTRAVENOUS

## 2019-08-30 MED ORDER — FENTANYL CITRATE (PF) 100 MCG/2ML IJ SOLN
INTRAMUSCULAR | Status: DC | PRN
  Administered 2019-08-30: 25 ug via INTRAVENOUS
  Administered 2019-08-30: 50 ug via INTRAVENOUS
  Administered 2019-08-30: 25 ug via INTRAVENOUS
  Administered 2019-08-30: 50 ug via INTRAVENOUS

## 2019-08-30 MED ORDER — MIDAZOLAM HCL 2 MG/2ML IJ SOLN
INTRAMUSCULAR | Status: AC
  Filled 2019-08-30: qty 2

## 2019-08-30 MED ORDER — POTASSIUM CHLORIDE CRYS ER 20 MEQ PO TBCR: 40.0000 meq | EXTENDED_RELEASE_TABLET | Freq: Once | ORAL | Status: DC

## 2019-08-30 MED ORDER — FENTANYL CITRATE (PF) 250 MCG/5ML IJ SOLN
INTRAMUSCULAR | Status: AC
  Filled 2019-08-30: qty 5

## 2019-08-30 MED ORDER — ROCURONIUM BROMIDE 10 MG/ML (PF) SYRINGE
PREFILLED_SYRINGE | INTRAVENOUS | Status: DC | PRN
  Administered 2019-08-30: 50 mg via INTRAVENOUS

## 2019-08-30 SURGICAL SUPPLY — 58 items
APL SKNCLS STERI-STRIP NONHPOA (GAUZE/BANDAGES/DRESSINGS)
BENZOIN TINCTURE PRP APPL 2/3 (GAUZE/BANDAGES/DRESSINGS) IMPLANT
BLADE CLIPPER SURG (BLADE) ×3 IMPLANT
BLADE SURG 10 STRL SS (BLADE) IMPLANT
BLADE SURG 15 STRL LF DISP TIS (BLADE) IMPLANT
BLADE SURG 15 STRL SS (BLADE)
BNDG GAUZE ELAST 4 BULKY (GAUZE/BANDAGES/DRESSINGS) IMPLANT
CANISTER SUCT 3000ML PPV (MISCELLANEOUS) ×3 IMPLANT
CANISTER WOUND CARE 500ML ATS (WOUND CARE) ×3 IMPLANT
CLIP VESOCCLUDE SM WIDE 24/CT (CLIP) IMPLANT
CNTNR URN SCR LID CUP LEK RST (MISCELLANEOUS) IMPLANT
CONT SPEC 4OZ STRL OR WHT (MISCELLANEOUS)
DRAPE LAPAROSCOPIC ABDOMINAL (DRAPES) ×3 IMPLANT
DRAPE SLUSH/WARMER DISC (DRAPES) IMPLANT
DRSG PAD ABDOMINAL 8X10 ST (GAUZE/BANDAGES/DRESSINGS) IMPLANT
DRSG VAC ATS LRG SENSATRAC (GAUZE/BANDAGES/DRESSINGS) ×3 IMPLANT
DRSG VAC ATS MED SENSATRAC (GAUZE/BANDAGES/DRESSINGS) ×5 IMPLANT
DRSG VAC ATS SM SENSATRAC (GAUZE/BANDAGES/DRESSINGS) ×3 IMPLANT
DRSG VERSA FOAM LRG 10X15 (GAUZE/BANDAGES/DRESSINGS) ×2 IMPLANT
ELECT BLADE 4.0 EZ CLEAN MEGAD (MISCELLANEOUS) ×3
ELECT REM PT RETURN 9FT ADLT (ELECTROSURGICAL) ×3
ELECTRODE BLDE 4.0 EZ CLN MEGD (MISCELLANEOUS) IMPLANT
ELECTRODE REM PT RTRN 9FT ADLT (ELECTROSURGICAL) ×1 IMPLANT
GAUZE SPONGE 4X4 12PLY STRL (GAUZE/BANDAGES/DRESSINGS) IMPLANT
GAUZE SPONGE 4X4 12PLY STRL LF (GAUZE/BANDAGES/DRESSINGS) ×2 IMPLANT
GAUZE XEROFORM 5X9 LF (GAUZE/BANDAGES/DRESSINGS) IMPLANT
GLOVE BIO SURGEON STRL SZ7.5 (GLOVE) ×6 IMPLANT
GOWN STRL REUS W/ TWL LRG LVL3 (GOWN DISPOSABLE) ×2 IMPLANT
GOWN STRL REUS W/TWL LRG LVL3 (GOWN DISPOSABLE) ×6
HANDPIECE INTERPULSE COAX TIP (DISPOSABLE) ×3
HEMOSTAT POWDER SURGIFOAM 1G (HEMOSTASIS) IMPLANT
HEMOSTAT SURGICEL 2X14 (HEMOSTASIS) IMPLANT
KIT BASIN OR (CUSTOM PROCEDURE TRAY) ×3 IMPLANT
KIT SUCTION CATH 14FR (SUCTIONS) IMPLANT
KIT TURNOVER KIT B (KITS) ×3 IMPLANT
MICROMATRIX 1000MG (Tissue) ×3 IMPLANT
NS IRRIG 1000ML POUR BTL (IV SOLUTION) ×3 IMPLANT
PACK GENERAL/GYN (CUSTOM PROCEDURE TRAY) ×3 IMPLANT
PAD ARMBOARD 7.5X6 YLW CONV (MISCELLANEOUS) ×6 IMPLANT
PROBE DEBRIDE SHARPVAC MISONIX (TIP) ×2 IMPLANT
SET HNDPC FAN SPRY TIP SCT (DISPOSABLE) ×1 IMPLANT
SOLUTION PARTIC MCRMTRX 1000MG (Tissue) IMPLANT
STAPLER VISISTAT 35W (STAPLE) IMPLANT
SUT ETHILON 3 0 FSL (SUTURE) IMPLANT
SUT VIC AB 1 CTX 36 (SUTURE)
SUT VIC AB 1 CTX36XBRD ANBCTR (SUTURE) IMPLANT
SUT VIC AB 2-0 CTX 27 (SUTURE) IMPLANT
SUT VIC AB 3-0 X1 27 (SUTURE) IMPLANT
SWAB COLLECTION DEVICE MRSA (MISCELLANEOUS) IMPLANT
SWAB CULTURE ESWAB REG 1ML (MISCELLANEOUS) IMPLANT
TAPE CLOTH SURG 4X10 WHT LF (GAUZE/BANDAGES/DRESSINGS) ×2 IMPLANT
TOWEL GREEN STERILE (TOWEL DISPOSABLE) ×3 IMPLANT
TOWEL GREEN STERILE FF (TOWEL DISPOSABLE) ×3 IMPLANT
TRAY FOLEY MTR SLVR 16FR STAT (SET/KITS/TRAYS/PACK) IMPLANT
TUBE CONNECTING 12'X1/4 (SUCTIONS) ×1
TUBE CONNECTING 12X1/4 (SUCTIONS) ×1 IMPLANT
TUBE IRRIGATION SET MISONIX (TUBING) ×4 IMPLANT
WATER STERILE IRR 1000ML POUR (IV SOLUTION) ×3 IMPLANT

## 2019-08-30 NOTE — Op Note (Signed)
NAME: Jonathan Wood, Jonathan Wood MEDICAL RECORD [id number] ACCOUNT [id number] DATE OF BIRTH:31-Aug-1970 FACILITY: [facility] LOCATION: [facility] PHYSICIAN:Ouida Abeyta VAN TRIGT III, MD  OPERATIVE REPORT  DATE OF PROCEDURE:  08/30/2019  OPERATION: 1.  Excisional abdominal wound debridement. 2.  Irrigation of abdominal wound. 3.  Application of wound VAC system.  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  ANESTHESIA:  General.  DESCRIPTION OF PROCEDURE:  The patient was brought from preoperative holding after informed consent was documented and final issues addressed with the patient.  He was brought directly to the operating room and placed supine on the operating table.   General anesthesia was induced.  During the entire procedure the VAD coordinator was present to monitor the VAD equipment and to assist with management with the patient with anesthesia.  The patient remained stable after intubation.  The previous wound was examined and then the Kerlix packing removed.  The chest was then prepped and draped as a sterile field.  A preoperative time-out was performed.  First sharp debridement of some necrotic fat was performed.  Overall, the wound showed improved granulation tissue since the last wound surgery 3 days ago.  The wound was then irrigated using the Misonix jet to remove the biofilm on the floor coating of the power cord.    Next, the wound was irrigated with copious amounts of sterile saline including the crevices between the outflow graft and the power cord and the tunnel extending underneath the power cord down into the left upper quadrant abdominal wall.  The entire  procedure was above the rectus sheath and there was no involvement intraperitoneal or into the chest  cavity.  The bend relief over the distal aspect of the outflow graft was visible without being involved by the infection.  The power cord is visible extending underneath the outflow tract and into the abdominal wall to the left side of the abdomen.  The tunnel was further debrided and the space was irrigated out.  After hemostasis was achieved and no more necrotic material was present with fairly good granulation tissue over the majority of the wound I placed some ACell paste, 1 gram, into the power cord tunnel and then placed wound VAC sponges into the wound as  well.  Two white sponges were used, 1 into the left upper quadrant tunnel and 1 into the crevice between the outflow graft and the power cord.  Two black sponges were used, 1 in the depth of the wound, and another to cover the subcutaneous fat space.   Over the sponges the wound VAC sheaths were placed in double layer.  The suction line was then attached and the wound VAC sponge collapsed as expected and the system was working well.  The patient was then reversed from anesthesia with plans to return to  the ICU in stable condition.  CN/NUANCE  D:08/30/2019 T:08/30/2019 JOB:[id number]

## 2019-08-30 NOTE — Progress Notes (Signed)
ANTICOAGULATION CONSULT NOTE - Follow Up Consult  Pharmacy Consult for warfarin Indication: LVAD  No Known Allergies  Patient Measurements: Height: 6\' 2"  (188 cm) Weight: 98.2 kg (216 lb 7.9 oz) IBW/kg (Calculated) : 82.2  Vital Signs: Temp: 98.6 F (37 C) (04/15 0823) Temp Source: Oral (04/15 0823) BP: 93/74 (04/15 1000) Pulse Rate: 118 (04/15 1000)  Labs: Recent Labs    08/28/19 0330 08/28/19 0330 08/29/19 0454 08/29/19 0817 08/30/19 0420  HGB 9.3*   < >  --  8.3* 8.4*  HCT 31.6*  --   --  28.1* 28.1*  PLT 236  --   --  188 204  LABPROT 23.3*  --  34.7*  --  27.3*  INR 2.1*  --  3.4*  --  2.5*  CREATININE 0.92  --   --  0.87 1.09   < > = values in this interval not displayed.    Estimated Creatinine Clearance: 98.5 mL/min (by C-G formula based on SCr of 1.09 mg/dL).   Medical History: Past Medical History:  Diagnosis Date  . Asthma   . CHF (congestive heart failure) (HCC)    a. 09/2016: EF 20-25% with cath showing normal cors  . GERD (gastroesophageal reflux disease)   . History of hiatal hernia   . OSA on CPAP 09/06/2018   Severe OSA with AHI 68/hr on CPAP at 12cm H2O    Assessment: Jonathan Wood with HF s/p LVAD implant HM3 place 4/19 admitted with drive line infection.    Warfarin PTA 7.5mg  MW and 5mg  all other days admit INR elevated at 5 - probably acute infection related supra-sensitive.   S/p OR today for repeat I&D with application of wound vac. INR today is at the higher end of goal 2.5. Hgb stable 8.4 and pltc wnl.  Per MD, will hold warfarin today and may resume warfarin tomorrow pending INR in AM.  Goal of Therapy:  INR 2-2.5 Monitor platelets by anticoagulation protocol: Yes   Plan:  Hold warfarin tonight Daily INR  5/19, PharmD, [facility] PGY2 Cardiology Pharmacy Resident Phone [phone] 08/30/2019       2:45 PM  Please check [email or web address] for unit-specific pharmacist phone numbers

## 2019-08-30 NOTE — TOC Progression Note (Addendum)
Transition of Care [facility]) - Progression Note    Patient Details  Name: Jonathan Wood MRN: [id number] Date of Birth: 1970-07-16  Transition of Care [facility]) CM/SW Contact  Janae Bridgeman, RN Phone Number: 08/30/2019, 3:45 PM  Clinical Narrative:    Case Management noted that patient returned from OR today 08/30/2019 for excision and debridement of abdominal wall and application of wound vac.  Called and scheduled a primary care visit with [facility] for future follow-up after hospitalization.  This is the nearest appointment available for the patient at this facility for care post-hospitalization.  [place] but not signed by the physician since patient went to surgery.  Dr. [name] paged and asked to sign wound vac forms when able.  Will follow-up.     Expected Discharge Plan and Services      Social Determinants of Health (SDOH) Interventions    Readmission Risk Interventions No flowsheet data found.

## 2019-08-30 NOTE — Progress Notes (Addendum)
Patient ID: Jonathan Wood, male   DOB: Jul 21, 1970, 49 y.o.   MRN: [id number]     Advanced Heart Failure Rounding Note  PCP-Cardiologist: Arvilla Meres, MD    Patient Profile   49 y.o. 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  Subjective:    - 4/5 S/p I&D w/ excisional debridement of subxiphoid abscess. Cultures + Staph Aureus  - 4/7, Returned to OR for debridement, Irrigation and Packing of Abdominal Incision - 4/9 S/P I&D abd wound with Acell and VAC application.  - 4/15 s/p I&D wound with VAC application.   Blood cultures positive for MSSA.  Vancomycin/Cefepime with recurrent fever, has PICC in place.  CVP 19.   Co-ox 57%     Breathing is somewhat improved today post-OR.   VAD Interrogation: Speed:5600 Flow:5.1 Power:4.2  PI:3.4   CT of C/A/P 08/16/19 IMPRESSION: 1. New fluid collections along the efferent tubing from the VAD to the ascending aorta. Findings are suspicious for infection given clinical presentation and elevated white blood cell count. 2. Stable fluid surrounding the line within the left upper quadrant anterior abdominal wall, with decreased adjacent fat stranding. Infection in this region cannot be excluded. 3. Stable cardiomegaly. 4. Stable emphysema. 5. Otherwise no acute intra-abdominal or intrapelvic process.  Objective:   Weight Range: 98.2 kg Body mass index is 27.8 kg/m.   Vital Signs:   Temp:  [97.6 F (36.4 C)-98.7 F (37.1 C)] 98.6 F (37 C) (04/15 0823) Pulse Rate:  [47-151] 118 (04/15 1000) Resp:  [11-41] 20 (04/15 1100) BP: (84-121)/(55-102) 93/74 (04/15 1000) SpO2:  [95 %-100 %] 99 % (04/15 1000) Weight:  [98.2 kg] 98.2 kg (04/15 0500) Last BM Date: 08/29/19  Weight change: Filed Weights   08/28/19 0500 08/29/19 0500 08/30/19 0500  Weight: 102.8 kg 101.1 kg  98.2 kg    Intake/Output:   Intake/Output Summary (Last 24 hours) at 08/30/2019 1426 Last data filed at 08/30/2019 1415 Gross per 24 hour  Intake 2495.91 ml  Output 3500 ml  Net -1004.09 ml    Telemetry: Sinus Tach 120s   Physical Exam   Physical Exam: CVP 19 General: Well appearing this am. NAD.  HEENT: Normal. Neck: Supple, JVP 14 cm. Carotids OK.  Cardiac:  Mechanical heart sounds with LVAD hum present.  Lungs:  CTAB, normal effort.  Abdomen:  NT, ND, no HSM. No bruits or masses. +BS  LVAD exit site: Site is dressed, wound vac in place.  Extremities:  Warm and dry. No cyanosis, clubbing, rash, or edema.  Neuro:  Alert & oriented x 3. Cranial nerves grossly intact. Moves all 4 extremities w/o difficulty. Affect pleasant    Labs    CBC Recent Labs    08/29/19 0817 08/30/19 0420  WBC 24.8* 19.6*  NEUTROABS  --  16.3*  HGB 8.3* 8.4*  HCT 28.1* 28.1*  MCV 98.9 97.2  PLT 188 204   Basic Metabolic Panel Recent Labs    78/29/56 0817 08/30/19 0420  NA 135 133*  K 3.1* 3.4*  CL 94* 90*  CO2 30 31  GLUCOSE 103* 124*  BUN 16 26*  CREATININE 0.87 1.09  CALCIUM 8.0* 8.4*  MG 1.6* 2.1   Liver Function Tests Recent Labs    08/29/19 0817  AST 33  ALT 13  ALKPHOS 123  BILITOT 3.2*  PROT 7.0  ALBUMIN  2.1*   No results for input(s): LIPASE, AMYLASE in the last 72 hours. Cardiac Enzymes No results for input(s): CKTOTAL, CKMB, CKMBINDEX, TROPONINI in the last 72 hours.  BNP: BNP (last 3 results) Recent Labs    03/21/19 1415 04/23/19 1350  BNP 426.9* 709.6*    ProBNP (last 3 results) No results for input(s): PROBNP in the last 8760 hours.   D-Dimer No results for input(s): DDIMER in the last 72 hours. Hemoglobin A1C No results for input(s): HGBA1C in the last 72 hours. Fasting Lipid Panel No results for input(s): CHOL, HDL, LDLCALC, TRIG, CHOLHDL, LDLDIRECT in the last 72 hours. Thyroid Function Tests No results for input(s): TSH, T4TOTAL, T3FREE,  THYROIDAB in the last 72 hours.  Invalid input(s): FREET3  Other results:   Imaging    No results found.   Medications:     Scheduled Medications: . vitamin C  250 mg Oral Daily  . bisacodyl  10 mg Oral Daily  . Chlorhexidine Gluconate Cloth  6 each Topical Daily  . docusate sodium  100 mg Oral TID  . DULoxetine  30 mg Oral Daily  . feeding supplement (ENSURE ENLIVE)  237 mL Oral TID BM  . feeding supplement (PRO-STAT SUGAR FREE 64)  30 mL Oral BID  . ferrous sulfate  325 mg Oral Q breakfast  . furosemide  80 mg Intravenous BID  . gabapentin  300 mg Oral TID  . hydrALAZINE  75 mg Oral TID  . HYDROmorphone   Intravenous Q4H  . linaclotide  145 mcg Oral Daily  . mouth rinse  15 mL Mouth Rinse BID  . multivitamin with minerals  1 tablet Oral Daily  . pantoprazole  40 mg Oral BID  . potassium chloride  40 mEq Oral Once  . senna-docusate  1 tablet Oral QHS  . sildenafil  20 mg Oral TID  . sodium chloride flush  10-40 mL Intracatheter Q12H  . sodium chloride flush  10-40 mL Intracatheter Q12H  . sodium chloride flush  3 mL Intravenous Q12H  . Warfarin - Pharmacist Dosing Inpatient   Does not apply q1600  . zinc sulfate  220 mg Oral Daily    Infusions: . sodium chloride 10 mL/hr at 08/30/19 1000  . amiodarone 30 mg/hr (08/30/19 1000)  . ceFEPime (MAXIPIME) IV Stopped (08/30/19 0449)  . dextrose 5 % and 0.45% NaCl Stopped (08/25/19 1414)  . lactated ringers    . phenylephrine (NEO-SYNEPHRINE) Adult infusion    . vancomycin Stopped (08/30/19 0644)    PRN Medications: sodium chloride, acetaminophen, albuterol, alum & mag hydroxide-simeth, bisacodyl **OR** bisacodyl, diphenhydrAMINE **OR** diphenhydrAMINE, guaiFENesin-dextromethorphan, hydrOXYzine, magnesium hydroxide, naloxone **AND** sodium chloride flush, ondansetron (ZOFRAN) IV, ondansetron (ZOFRAN) IV, oxyCODONE, sodium chloride flush, sodium chloride flush, sodium chloride flush, traMADol    Assessment/Plan     1. Fever & leukocytosis -> MSSA bacteremia/ Subxiphoid Abscess  - bcx 2/2 MSSA - ID has changed antibiotics to cefazolin, now has PICC.   - CBC pending.  -  May need to re consult ID regarding abx.    - He is not a candidate for VAD exchange due to severe RV failure and noncompliance.  - S/p I&D on 4/5. Wound cultures +MSSA.  - Back to OR 4/7, 4/9, 4/15 debridement and irrigation of driveline + packing, wound vac in place.    - Plan for return trip to OR tomorrow.  -  ID recommends 6 weeks of IV cefazolin followed by long-term suppressive oral cephalexin. However, with high  fever on 4/13, abx broadened to vancomycin/cefepime which continue  - on PCA pump for pain control, still requiring doses but using less w/ constipation.   2.Acute on chronic systolic HF - EF 24% also has severe RV failure.  S/p HM-3 LVAD on 09/06/17 - On sildenafil for RV failure.  - Co-ox 57% with CVP 19 today.  Weight is down.  Will continue Lasix 80 mg IV bid and will give another dose of metolazone with pm Lasix.   3. HM3 LVAD 09/06/2017. - VAD interrogated personally. Parameters stable. - LDH 208  today  - INR 2.5 on warfarin.  - Off ASA with PUD.    4. Essential HTN - Continue hydralazine 75 mg tid. - Continue sildenafil 20 mg tid     5. AKI - Creatinine stable.   6. OSA - sleep study with very severe OSA (AHI 69/hr) - non-compliant Bipap in the past, has refused to use.  - Will see if he would be willing to re-try this admission.   7. H/o GI bleed  - 08/2018 EGD completed and showed gastritis and nonbleeding duodenal ulcers, colonoscopy with 5 polyps removed. Protonix was increased to BID.  - No recent GI bleeding - hgb 8.4 - He will stay off aspirin for now.   8. Tobacco Abuse  - says he quit recently  21. Severe protein calorie malnutrition  - nutrition seeing -Check albumin today.   10. Constipation  - Abd plain film 4/12 unremarkable  - suspect related to pain meds - also  h/o IBS   CRITICAL CARE Performed by: Loralie Champagne  Total critical care time: 35 minutes  Critical care time was exclusive of separately billable procedures and treating other patients.  Critical care was necessary to treat or prevent imminent or life-threatening deterioration.  Critical care was time spent personally by me on the following activities: development of treatment plan with patient and/or surrogate as well as nursing, discussions with consultants, evaluation of patient's response to treatment, examination of patient, obtaining history from patient or surrogate, ordering and performing treatments and interventions, ordering and review of laboratory studies, ordering and review of radiographic studies, pulse oximetry and re-evaluation of patient's condition.   Length of Stay: [place], MD  08/30/2019, 2:26 PM  Advanced Heart Failure Team Pager [phone] (M-F; 7a - 4p)  Please contact [facility] for night-coverage after hours (4p -7a ) and weekends on [email or web address] 2:26 PM

## 2019-08-30 NOTE — Progress Notes (Signed)
CSW met at bedside with patient and mother. Patient reports he is feeling better today and feeling hopeful. Patient and mother asked questions about options for added care at home post discharge. Patient's mother specifically asked about a lift chair at home. CSW discussed options and stressed the importance of focusing on today and feeling some improvement. CSW provided support and encouragement to patient and mother. CSW continues to follow throughout hospitalization as needed. [name], [place], [place]

## 2019-08-30 NOTE — Anesthesia Procedure Notes (Signed)
Procedure Name: Intubation Date/Time: 08/30/2019 12:38 PM Performed by: Quentin Ore, CRNA Pre-anesthesia Checklist: Patient identified, Emergency Drugs available, Suction available and Patient being monitored Patient Re-evaluated:Patient Re-evaluated prior to induction Oxygen Delivery Method: Circle system utilized Preoxygenation: Pre-oxygenation with 100% oxygen Induction Type: IV induction Ventilation: Mask ventilation without difficulty and Oral airway inserted - appropriate to patient size Laryngoscope Size: Glidescope and 4 Tube type: Oral Tube size: 7.5 mm Number of attempts: 1 Airway Equipment and Method: Video-laryngoscopy Placement Confirmation: ETT inserted through vocal cords under direct vision,  positive ETCO2 and breath sounds checked- equal and bilateral Secured at: 22 cm Tube secured with: Tape Dental Injury: Teeth and Oropharynx as per pre-operative assessment

## 2019-08-30 NOTE — Anesthesia Preprocedure Evaluation (Signed)
Anesthesia Evaluation  Patient identified by MRN, date of birth, ID band Patient awake    Reviewed: Allergy & Precautions, H&P , NPO status , Patient's Chart, lab work & pertinent test results  Airway Mallampati: III   Neck ROM: full  Mouth opening: Limited Mouth Opening  Dental   Pulmonary asthma , sleep apnea , COPD, Current Smoker and Patient abstained from smoking.,    breath sounds clear to auscultation       Cardiovascular hypertension, +CHF   Rhythm:regular Rate:Normal  LVAD in place   Neuro/Psych PSYCHIATRIC DISORDERS Anxiety    GI/Hepatic hiatal hernia, GERD  ,  Endo/Other    Renal/GU      Musculoskeletal   Abdominal   Peds  Hematology  (+) Blood dyscrasia, anemia ,   Anesthesia Other Findings   Reproductive/Obstetrics                             Anesthesia Physical Anesthesia Plan  ASA: IV  Anesthesia Plan: General   Post-op Pain Management:    Induction: Intravenous  PONV Risk Score and Plan: 1 and Ondansetron, Dexamethasone, Midazolam and Treatment may vary due to age or medical condition  Airway Management Planned: Oral ETT  Additional Equipment:   Intra-op Plan:   Post-operative Plan: Extubation in OR  Informed Consent: I have reviewed the patients History and Physical, chart, labs and discussed the procedure including the risks, benefits and alternatives for the proposed anesthesia with the patient or authorized representative who has indicated his/her understanding and acceptance.       Plan Discussed with: CRNA, Anesthesiologist and Surgeon  Anesthesia Plan Comments:         Anesthesia Quick Evaluation

## 2019-08-30 NOTE — Progress Notes (Addendum)
VAD Coordinator Procedure Note:   VAD Coordinator met patient in short stay / OR. Pt undergoing irrigation, debridement, and application of wound vac per Dr. Prescott Gum. Hemodynamics and VAD parameters monitored by myself and anesthesia throughout the procedure. Blood pressures were obtained with automatic cuff on left arm. BP maintained with Albumin and Neosynephrine drip.     Time: Doppler Auto  BP Flow PI Power Speed  Pre-procedure:  1145  108/60 (71) 5.2 3.0 4.4 5600   1215  91/74 (82) 5.4 2.6 4.3 5600           Sedation Induction: 1230  109/94 (100) 5.3 2.9 4.2 5600   1245  97/69 (78) 5.7 1.8 4.4 5600   1300  77/62 (69) 5.7 2.0 4.3 5600   1315  104/89 (96) 5.6 4.9 4.3 5600   1330  98/74 (82) 5.5 2.9 4.4 5600   1345  103/82 (91) 5.3 2.8 4.2 5600           Recovery Area:                   Patient Disposition: Patient tolerated the procedure well. VAD Coordinator accompanied and remained with patient. Transported patient back to 2H05.   Existing VAD dressing removed and site care performed using sterile technique. Drive line exit site cleaned with Chlora prep applicators x 2, allowed to dry, and Sorbaview dressing with bio patch re-applied. Exit site healed and incorporated, the velour is fully implanted at exit site. No redness, tenderness, drainage, foul odor or rash noted. Drive line anchor re-applied. Will advance to weekly dressing changes per bedside RN.   Jonathan Monte RN [place] Coordinator  Office: [phone]  24/7 Pager: [phone]

## 2019-08-30 NOTE — Progress Notes (Signed)
Pre Procedure note for inpatients:   Jonathan Wood has been scheduled for Procedure(s): Irrigation and Debridement with WOUND VAC APPLICATION (N/A) Application Of A-Cell Of Chest/Abdomen (N/A) today. The various methods of treatment have been discussed with the patient. After consideration of the risks, benefits and treatment options the patient has consented to the planned procedure.   The patient has been seen and labs reviewed. There are no changes in the patient's condition to prevent proceeding with the planned procedure today.  Recent labs:  Lab Results  Component Value Date   WBC 19.6 (H) 08/30/2019   HGB 8.4 (L) 08/30/2019   HCT 28.1 (L) 08/30/2019   PLT 204 08/30/2019   GLUCOSE 124 (H) 08/30/2019   CHOL 111 08/30/2017   TRIG 38 08/30/2017   HDL 33 (L) 08/30/2017   LDLCALC 70 08/30/2017   ALT 13 08/29/2019   AST 33 08/29/2019   NA 133 (L) 08/30/2019   K 3.4 (L) 08/30/2019   CL 90 (L) 08/30/2019   CREATININE 1.09 08/30/2019   BUN 26 (H) 08/30/2019   CO2 31 08/30/2019   TSH 0.209 (L) 08/13/2019   INR 2.5 (H) 08/30/2019   HGBA1C 5.2 08/30/2017    Mikey Bussing, MD 08/30/2019 7:26 AM

## 2019-08-30 NOTE — Brief Op Note (Signed)
08/16/2019 - 08/30/2019  1:47 PM  PATIENT:  Jonathan Wood  49 y.o. male  PRE-OPERATIVE DIAGNOSIS:  VAD SITE WOUND  POST-OPERATIVE DIAGNOSIS:  VAD SITE WOUND  PROCEDURE:  Excisional Debridement and Irrigation of abdominal wound, Application of Wound VAC  SURGEON:  Surgeon(s) and Role:    Kerin Perna, MD - Primary  PHYSICIAN ASSISTANT:   ASSISTANTS: none   ANESTHESIA:   general  EBL:  10 ml   BLOOD ADMINISTERED:none  DRAINS: none   LOCAL MEDICATIONS USED:  NONE  SPECIMEN:  No Specimen  DISPOSITION OF SPECIMEN:  N/A  COUNTS:  YES  TOURNIQUET:  * No tourniquets in log *  DICTATION: .Dragon Dictation  PLAN OF CARE: return to 2 H  PATIENT DISPOSITION:  ICU - extubated and stable.   Delay start of Pharmacological VTE agent (>24hrs) due to surgical blood loss or risk of bleeding: yes

## 2019-08-30 NOTE — Progress Notes (Signed)
LVAD Coordinator Rounding Note:  Admitted 08/16/19 by Dr. [name] due to VAD drive line infection/pneumonia/sepsis  HM III LVAD implanted on 09/06/17 by Dr. Cyndia Bent under Destination Therapy criteria.  Met patient in short stay; undergoing irrigation, debridement, and wound vac change with Dr [place] Gum. Pt states he feels ok, but is thirsty.   Vital signs: Temp: 98.6 HR: 98 Doppler Pressure: 88 Automatic BP: 102/81 (89) O2 Sat: 98% on 2L Wt: 227.5>231>235>233.9>236.3>236.5>235.6>226.6>222.8>216.5 lbs   LVAD interrogation reveals:  Speed: 5600 Flow: 5.2 Power:  4.3w PI: 2.9 Alarms: none Events: none Hematocrit: 28  Fixed speed: 5600 Low speed limit: 5300  Drive Line: CDI. Twice weekly dressing changes using daily kit w/siliver strip by bedside nurse. Next dressing change today in OR.   Sternal wound: To be changed in OR today.   Labs:  LDH trend: 227>230>271>245>233>307>336>182>214>231>208  INR trend: 4.5>5.0>1.7>1.7>1.7>1.8>1.7>2.1>3.4>2.5  WBC: 27.3>26.2>24>34.6>25.8>20.8>16.9>13.0>12.5>21>24.8>19.6  Alk Phos: 138> 177>123  AST: 53> 154>33  ALT: 17> 35>13  Infection: - blood cultures 08/16/19>>staphylococcus aureus - blood cultures 08/18/19>> NGTD - Drive line tunnel / sternum 08/20/19>> abundant WBC, gram + cocci, culture- few staph aureus - Drive line AFB 6/6/440>> pending - Drive line/Sternal Fungus culture 08/20/19>> negative - blood cultures 08/28/19>>no growth 1 day; final report pending - body fluid culture 08/28/19>> no growth 2 days; final report pending  Anticoagulation Plan: -INR Goal: 2.0 - 2.5  -ASA Dose: none  Adverse Events: - driveline cx 34/7/42>> staph aureus - Admitted for sternal/drive line infection. OR for debridement 08/20/19- + staph aureus wound cultures; further irrigation/debridements done 08/22/19, 08/24/19, 08/30/19   Plan/Recommendations:  1. Call VAD pager if any VAD equipment or drive line issues. 2. Twice weekly dressing changes by  bedside nurse.  3. VAD coordinator will accompany pt to OR today.  Emerson Monte RN [place] Coordinator  Office: [phone]  24/7 Pager: [phone]

## 2019-08-30 NOTE — Transfer of Care (Signed)
Immediate Anesthesia Transfer of Care Note  Patient: Jonathan Wood  Procedure(s) Performed: APPLICATION OF WOUND VAC (N/A Abdomen) Debridement Abdominal Wound (N/A Abdomen)  Patient Location: SICU  Anesthesia Type:General  Level of Consciousness: awake, alert  and oriented  Airway & Oxygen Therapy: Patient Spontanous Breathing and Patient connected to face mask oxygen  Post-op Assessment: Report given to RN, Post -op Vital signs reviewed and stable and Patient moving all extremities X 4  Post vital signs: Reviewed and stable  Last Vitals:  Vitals Value Taken Time  BP    Temp    Pulse 102 08/30/19 1422  Resp 72 08/30/19 1425  SpO2 54 % 08/30/19 1422  Vitals shown include unvalidated device data.  Last Pain:  Vitals:   08/30/19 0823  TempSrc: Oral  PainSc:       Patients Stated Pain Goal: 3 (08/28/19 1706)  Complications: No apparent anesthesia complications

## 2019-08-31 DIAGNOSIS — B9561 Methicillin susceptible Staphylococcus aureus infection as the cause of diseases classified elsewhere: Secondary | ICD-10-CM | POA: Diagnosis not present

## 2019-08-31 DIAGNOSIS — T827XXA Infection and inflammatory reaction due to other cardiac and vascular devices, implants and grafts, initial encounter: Secondary | ICD-10-CM | POA: Diagnosis not present

## 2019-08-31 DIAGNOSIS — Z95811 Presence of heart assist device: Secondary | ICD-10-CM | POA: Diagnosis not present

## 2019-08-31 DIAGNOSIS — I5042 Chronic combined systolic (congestive) and diastolic (congestive) heart failure: Secondary | ICD-10-CM

## 2019-08-31 LAB — CBC WITH DIFFERENTIAL/PLATELET
Abs Immature Granulocytes: 0.09 10*3/uL — ABNORMAL HIGH (ref 0.00–0.07)
Basophils Absolute: 0 10*3/uL (ref 0.0–0.1)
Basophils Relative: 0 %
Eosinophils Absolute: 0 10*3/uL (ref 0.0–0.5)
Eosinophils Relative: 0 %
HCT: 26.8 % — ABNORMAL LOW (ref 39.0–52.0)
Hemoglobin: 8 g/dL — ABNORMAL LOW (ref 13.0–17.0)
Immature Granulocytes: 1 %
Lymphocytes Relative: 4 %
Lymphs Abs: 0.5 10*3/uL — ABNORMAL LOW (ref 0.7–4.0)
MCH: 29.2 pg (ref 26.0–34.0)
MCHC: 29.9 g/dL — ABNORMAL LOW (ref 30.0–36.0)
MCV: 97.8 fL (ref 80.0–100.0)
Monocytes Absolute: 0.4 10*3/uL (ref 0.1–1.0)
Monocytes Relative: 3 %
Neutro Abs: 12.1 10*3/uL — ABNORMAL HIGH (ref 1.7–7.7)
Neutrophils Relative %: 92 %
Platelets: 196 10*3/uL (ref 150–400)
RBC: 2.74 MIL/uL — ABNORMAL LOW (ref 4.22–5.81)
RDW: 17.2 % — ABNORMAL HIGH (ref 11.5–15.5)
WBC: 13.2 10*3/uL — ABNORMAL HIGH (ref 4.0–10.5)
nRBC: 0 % (ref 0.0–0.2)

## 2019-08-31 LAB — BASIC METABOLIC PANEL
Anion gap: 14 (ref 5–15)
BUN: 36 mg/dL — ABNORMAL HIGH (ref 6–20)
CO2: 31 mmol/L (ref 22–32)
Calcium: 8.9 mg/dL (ref 8.9–10.3)
Chloride: 86 mmol/L — ABNORMAL LOW (ref 98–111)
Creatinine, Ser: 1.4 mg/dL — ABNORMAL HIGH (ref 0.61–1.24)
GFR calc Af Amer: >60 mL/min (ref >60)
GFR calc non Af Amer: 60 mL/min — ABNORMAL LOW (ref >60)
Glucose, Bld: 134 mg/dL — ABNORMAL HIGH (ref 70–99)
Potassium: 3.8 mmol/L (ref 3.5–5.1)
Sodium: 131 mmol/L — ABNORMAL LOW (ref 135–145)

## 2019-08-31 LAB — MAGNESIUM: Magnesium: 1.9 mg/dL (ref 1.7–2.4)

## 2019-08-31 LAB — COOXEMETRY PANEL
Carboxyhemoglobin: 2.5 % — ABNORMAL HIGH (ref 0.5–1.5)
Methemoglobin: 1 % (ref 0.0–1.5)
O2 Saturation: 53.5 %
Total hemoglobin: 11.3 g/dL — ABNORMAL LOW (ref 12.0–16.0)

## 2019-08-31 LAB — PROTIME-INR
INR: 2.7 — ABNORMAL HIGH (ref 0.8–1.2)
Prothrombin Time: 28.2 seconds — ABNORMAL HIGH (ref 11.4–15.2)

## 2019-08-31 LAB — BODY FLUID CULTURE: Culture: NO GROWTH

## 2019-08-31 LAB — LACTATE DEHYDROGENASE: LDH: 187 U/L (ref 98–192)

## 2019-08-31 MED ORDER — RIFABUTIN 150 MG PO CAPS
300.0000 mg | ORAL_CAPSULE | Freq: Every day | ORAL | Status: DC
  Administered 2019-08-31 – 2019-09-13 (×14): 300 mg via ORAL
  Filled 2019-08-31 (×16): qty 2

## 2019-08-31 MED ORDER — POTASSIUM CHLORIDE CRYS ER 20 MEQ PO TBCR
40.0000 meq | EXTENDED_RELEASE_TABLET | Freq: Once | ORAL | Status: AC
  Administered 2019-08-31: 08:00:00 40 meq via ORAL
  Filled 2019-08-31: qty 2

## 2019-08-31 MED ORDER — METOLAZONE 2.5 MG PO TABS
2.5000 mg | ORAL_TABLET | Freq: Once | ORAL | Status: AC
  Administered 2019-08-31: 2.5 mg via ORAL
  Filled 2019-08-31: qty 1

## 2019-08-31 MED ORDER — MAGNESIUM SULFATE 2 GM/50ML IV SOLN
2.0000 g | Freq: Once | INTRAVENOUS | Status: AC
  Administered 2019-08-31: 2 g via INTRAVENOUS
  Filled 2019-08-31: qty 50

## 2019-08-31 MED ORDER — CEFAZOLIN SODIUM-DEXTROSE 2-4 GM/100ML-% IV SOLN
2.0000 g | Freq: Three times a day (TID) | INTRAVENOUS | Status: DC
  Administered 2019-08-31 – 2019-09-04 (×13): 2 g via INTRAVENOUS
  Filled 2019-08-31 (×14): qty 100

## 2019-08-31 NOTE — Progress Notes (Signed)
[facility] for Infectious Disease  Date of Admission:  08/16/2019     Total days of antibiotics 16         ASSESSMENT:  Jonathan Wood is POD #1 from repeat I&D and application of wound VAC for MSSA infection associated with LVAD and complicated by abscess formation. Feeling better today and is without fever with improved WBC count. Cultures from 4/13 remain without growth. Will narrow antibiotic therapy back to Cefazolin. Scheduled for wound VAC change on Monday. Continue wound care per Dr. [place] Gum.   Dr. Linus Salmons will be available over the weekend as needed.   PLAN:  1. Narrow antibiotics to Cefazolin for MSSA infection. 2. Monitor cultures for bacteremia.  3. Wound care per Dr. [place] Gum.   Principal Problem:   Infection associated with driveline of left ventricular assist device (LVAD) (HCC) Active Problems:   Elevated LFTs   LVAD (left ventricular assist device) present (HCC)   Normocytic anemia   Chronic combined systolic (congestive) and diastolic (congestive) heart failure (HCC)   OSA on CPAP   Sepsis (HCC)   Cigarette smoker   COPD (chronic obstructive pulmonary disease) (HCC)   Hypertension   Bacteremia due to methicillin susceptible Staphylococcus aureus (MSSA)   . amiodarone  200 mg Oral BID  . vitamin C  250 mg Oral Daily  . bisacodyl  10 mg Oral Daily  . Chlorhexidine Gluconate Cloth  6 each Topical Daily  . docusate sodium  100 mg Oral TID  . DULoxetine  30 mg Oral Daily  . feeding supplement (ENSURE ENLIVE)  237 mL Oral TID BM  . feeding supplement (PRO-STAT SUGAR FREE 64)  30 mL Oral BID  . ferrous sulfate  325 mg Oral Q breakfast  . furosemide  80 mg Intravenous BID  . gabapentin  300 mg Oral TID  . hydrALAZINE  75 mg Oral TID  . linaclotide  145 mcg Oral Daily  . mouth rinse  15 mL Mouth Rinse BID  . multivitamin with minerals  1 tablet Oral Daily  . pantoprazole  40 mg Oral BID  . rifabutin  300 mg Oral Daily  . senna-docusate  1 tablet Oral  QHS  . sildenafil  20 mg Oral TID  . sodium chloride flush  10-40 mL Intracatheter Q12H  . sodium chloride flush  10-40 mL Intracatheter Q12H  . sodium chloride flush  3 mL Intravenous Q12H  . Warfarin - Pharmacist Dosing Inpatient   Does not apply q1600  . zinc sulfate  220 mg Oral Daily    SUBJECTIVE:  Feeling better today. Afebrile and WBC count is down trending.   No Known Allergies   Review of Systems: Review of Systems  Constitutional: Negative for chills, fever and weight loss.  Respiratory: Negative for cough, shortness of breath and wheezing.   Cardiovascular: Negative for chest pain and leg swelling.  Gastrointestinal: Negative for abdominal pain, constipation, diarrhea, nausea and vomiting.  Skin: Negative for rash.      OBJECTIVE: Vitals:   08/31/19 1000 08/31/19 1021 08/31/19 1100 08/31/19 1109  BP: (!) 88/74     Pulse: (!) 104  (!) 104   Resp: 13 (!) 30 (!) 23   Temp:    97.7 F (36.5 C)  TempSrc:      SpO2: 94% 94% 97%   Weight:      Height:       Body mass index is 28.28 kg/m.  Physical Exam Constitutional:  General: He is not in acute distress.    Appearance: He is well-developed.     Comments: Lying in the chair next to the bed sleeping; arousable.  Cardiovascular:     Rate and Rhythm: Normal rate and regular rhythm.     Comments: LVAD hum Pulmonary:     Effort: Pulmonary effort is normal.     Breath sounds: Normal breath sounds.  Skin:    General: Skin is warm and dry.  Neurological:     Mental Status: He is alert and oriented to person, place, and time.  Psychiatric:        Behavior: Behavior normal.        Thought Content: Thought content normal.        Judgment: Judgment normal.     Lab Results Lab Results  Component Value Date   WBC 13.2 (H) 08/31/2019   HGB 8.0 (L) 08/31/2019   HCT 26.8 (L) 08/31/2019   MCV 97.8 08/31/2019   PLT 196 08/31/2019    Lab Results  Component Value Date   CREATININE 1.40 (H) 08/31/2019     BUN 36 (H) 08/31/2019   NA 131 (L) 08/31/2019   K 3.8 08/31/2019   CL 86 (L) 08/31/2019   CO2 31 08/31/2019    Lab Results  Component Value Date   ALT 13 08/29/2019   AST 33 08/29/2019   ALKPHOS 123 08/29/2019   BILITOT 3.2 (H) 08/29/2019     Microbiology: Recent Results (from the past 240 hour(s))  Culture, blood (routine x 2)     Status: None (Preliminary result)   Collection Time: 08/28/19 10:15 AM   Specimen: BLOOD  Result Value Ref Range Status   Specimen Description BLOOD LEFT ANTECUBITAL  Final   Special Requests   Final    BOTTLES DRAWN AEROBIC AND ANAEROBIC Blood Culture adequate volume   Culture   Final    NO GROWTH 2 DAYS Performed at [facility], [address], [place], [place] [address]    Report Status PENDING  Incomplete  Culture, blood (routine x 2)     Status: None (Preliminary result)   Collection Time: 08/28/19 10:24 AM   Specimen: BLOOD LEFT HAND  Result Value Ref Range Status   Specimen Description BLOOD LEFT HAND  Final   Special Requests   Final    BOTTLES DRAWN AEROBIC AND ANAEROBIC Blood Culture adequate volume   Culture   Final    NO GROWTH 2 DAYS Performed at [facility], [address] [place], [place] [address]    Report Status PENDING  Incomplete  Body fluid culture     Status: None   Collection Time: 08/28/19 12:00 PM   Specimen: Body Fluid  Result Value Ref Range Status   Specimen Description FLUID  Final   Special Requests ABDOMEN  Final   Gram Stain   Final    FEW WBC PRESENT, PREDOMINANTLY PMN NO ORGANISMS SEEN    Culture   Final    NO GROWTH 3 DAYS Performed at [facility], [address], [place], [place] [address]    Report Status 08/31/2019 FINAL  Final     Marcos Eke, NP [facility]  08/31/2019  12:29 PM

## 2019-08-31 NOTE — Progress Notes (Signed)
27 mL Dilaudid wasted with Lorenza Cambridge, RN at this time.

## 2019-08-31 NOTE — Progress Notes (Signed)
ANTICOAGULATION CONSULT NOTE - Follow Up Consult  Pharmacy Consult for warfarin Indication: LVAD  No Known Allergies  Patient Measurements: Height: 6\' 2"  (188 cm) Weight: 99.9 kg (220 lb 3.8 oz) IBW/kg (Calculated) : 82.2  Vital Signs: Temp: 97.7 F (36.5 C) (04/16 0729) Temp Source: Oral (04/16 0400) BP: 90/61 (04/16 0700) Pulse Rate: 105 (04/16 0700)  Labs: Recent Labs    08/29/19 0454 08/29/19 0817 08/29/19 0817 08/30/19 0420 08/31/19 0326  HGB  --  8.3*   < > 8.4* 8.0*  HCT  --  28.1*  --  28.1* 26.8*  PLT  --  188  --  204 196  LABPROT 34.7*  --   --  27.3* 28.2*  INR 3.4*  --   --  2.5* 2.7*  CREATININE  --  0.87  --  1.09 1.40*   < > = values in this interval not displayed.    Estimated Creatinine Clearance: 83.3 mL/min (A) (by C-G formula based on SCr of 1.4 mg/dL (H)).   Medical History: Past Medical History:  Diagnosis Date  . Asthma   . CHF (congestive heart failure) (HCC)    a. 09/2016: EF 20-25% with cath showing normal cors  . GERD (gastroesophageal reflux disease)   . History of hiatal hernia   . OSA on CPAP 09/06/2018   Severe OSA with AHI 68/hr on CPAP at 12cm H2O    Assessment: 48yom with HF s/p LVAD implant HM3 place 4/19 admitted with drive line infection.    Warfarin PTA 7.5mg  MW and 5mg  all other days admit INR elevated at 5 - probably acute infection related supra-sensitive.   S/p OR on 4/15 for repeat I&D with application of wound vac.   INR today is above goal at 2.7. Hgb stable 8 and pltc wnl.  Goal of Therapy:  INR 2-2.5 Monitor platelets by anticoagulation protocol: Yes   Plan:  Hold warfarin tonight Daily INR  , PharmD, [facility] PGY2 Cardiology Pharmacy Resident Phone [phone] 08/31/2019       9:53 AM  Please check [email or web address] for unit-specific pharmacist phone numbers

## 2019-08-31 NOTE — Care Management (Signed)
Contacted and spoke with Boyce Medici, HF NP concerning physician's signature for [facility] wound vac paperwork placed on patient's chart.  Sharol Harness, NP states that she will followup and have Dr. Donata Clay sign the wound vac forms as necessary today.  Will followup and have the forms faxed to the [facility] rep.  Plans are for patient to transfer to 2 C today.

## 2019-08-31 NOTE — Progress Notes (Signed)
LVAD Coordinator Rounding Note:  Admitted 08/16/19 by Dr. [name] due to VAD drive line infection/pneumonia/sepsis  HM III LVAD implanted on 09/06/17 by Dr. Cyndia Bent under Destination Therapy criteria.  Pt asleep in the bed.   Vital signs: Temp: 98.6 HR: 98 Doppler Pressure: 88 Automatic BP: 102/81 (89) O2 Sat: 98% on 2L Wt: 227.5>231>235>233.9>236.3>236.5>235.6>226.6>222.8>216.5 lbs   LVAD interrogation reveals:  Speed: 5600 Flow: 5.2 Power:  4.3w PI: 2.9 Alarms: none Events: none Hematocrit: 28  Fixed speed: 5600 Low speed limit: 5300  Drive Line: CDI. Twice weekly dressing changes using daily kit w/siliver strip by bedside nurse. Next dressing change due 09/06/19 by bedside nurse.   Sternal wound: To be changed in OR on Monday.   Labs:  LDH trend: 227>230>271>245>233>307>336>182>214>231>208>187  INR trend: 4.5>5.0>1.7>1.7>1.7>1.8>1.7>2.1>3.4>2.5>2.7  WBC: 27.3>26.2>24>34.6>25.8>20.8>16.9>13.0>12.5>21>24.8>19.6>13.2  Alk Phos: 138> 177>123  AST: 53> 154>33  ALT: 17> 35>13  Infection: - blood cultures 08/16/19>>staphylococcus aureus - blood cultures 08/18/19>> NGTD - Drive line tunnel / sternum 08/20/19>> abundant WBC, gram + cocci, culture- few staph aureus - Drive line AFB 2/2/179>> pending - Drive line/Sternal Fungus culture 08/20/19>> negative - blood cultures 08/28/19>>no growth 1 day; final report pending - body fluid culture 08/28/19>> no growth 2 days; final report pending  Anticoagulation Plan: -INR Goal: 2.0 - 2.5  -ASA Dose: none  Adverse Events: - driveline cx 81/0/25>> staph aureus - Admitted for sternal/drive line infection. OR for debridement 08/20/19- + staph aureus wound cultures; further irrigation/debridements done 08/22/19, 08/24/19, 08/30/19   Plan/Recommendations:  1. Call VAD pager if any VAD equipment or drive line issues. 2. Twice weekly dressing changes by bedside nurse.  3. VAD coordinator will accompany pt to OR Monday  [name]  RN [place] Coordinator  Office: [phone]  24/7 Pager: [phone]

## 2019-08-31 NOTE — Progress Notes (Addendum)
Patient ID: Jonathan Wood, male   DOB: 02-10-1971, 49 y.o.   MRN: [id number]     Advanced Heart Failure Rounding Note  PCP-Cardiologist: Arvilla Meres, MD    Patient Profile   49 y.o. 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  Subjective:    - 4/5 S/p I&D w/ excisional debridement of subxiphoid abscess. Cultures + Staph Aureus  - 4/7, Returned to OR for debridement, Irrigation and Packing of Abdominal Incision - 4/9 S/P I&D abd wound with Acell and VAC application.  - 4/15 s/p I&D wound with VAC application.   Blood cultures positive for MSSA.  Vancomycin/Cefepime with recurrent fever, has PICC in place.  WBC trending down, 24>>19>>13. AF.  Co-ox 54%. Good UOP w/ IV Lasix, -2.5L yesterday. CVP down to 13-14 today. SCr bump w/ metolazone yesterday from 1.09>>1.4.    OOB and sitting up in chair. Continues w/ mild pain and using pain pump but a little bit more comfortable today.    VAD Interrogation: Speed:5650 Flow:5.3 Power:4.2  PI:2.8. no PI events   CT of C/A/P 08/16/19 IMPRESSION: 1. New fluid collections along the efferent tubing from the VAD to the ascending aorta. Findings are suspicious for infection given clinical presentation and elevated white blood cell count. 2. Stable fluid surrounding the line within the left upper quadrant anterior abdominal wall, with decreased adjacent fat stranding. Infection in this region cannot be excluded. 3. Stable cardiomegaly. 4. Stable emphysema. 5. Otherwise no acute intra-abdominal or intrapelvic process.  Objective:   Weight Range: 99.9 kg Body mass index is 28.28 kg/m.   Vital Signs:   Temp:  [97.4 F (36.3 C)-98.6 F (37 C)] 97.7 F (36.5 C) (04/16 0729) Pulse Rate:  [47-216] 105 (04/16 0700) Resp:  [16-47] 27 (04/16 0700) BP: (84-109)/(60-86) 90/61  (04/16 0700) SpO2:  [82 %-100 %] 98 % (04/16 0700) Weight:  [99.9 kg] 99.9 kg (04/16 0600) Last BM Date: 08/29/19  Weight change: Filed Weights   08/29/19 0500 08/30/19 0500 08/31/19 0600  Weight: 101.1 kg 98.2 kg 99.9 kg    Intake/Output:   Intake/Output Summary (Last 24 hours) at 08/31/2019 0746 Last data filed at 08/31/2019 0700 Gross per 24 hour  Intake 2307.93 ml  Output 2500 ml  Net -192.07 ml    Telemetry: Sinus Tach 120s   Physical Exam   Physical Exam: CVP 13-14 General: fatigue appearing. OOB in chair. No respiratory difficulty  HEENT: Normal. Neck: Supple, JVP 1-314 cm. Carotids OK.  Cardiac:  + Mechanical heart sounds with LVAD hum present.  Lungs:  Clear, no wheezing   Abdomen:  Mildly distended but nontender. No bruits or masses. +BS  LVAD exit site: Site is dressed, wound vac in place.  Extremities:  Warm and dry. No cyanosis, clubbing, rash, or edema.  Neuro:  Alert & oriented x 3. Cranial nerves grossly intact. Moves all 4 extremities w/o difficulty. Affect pleasant    Labs    CBC Recent Labs    08/30/19 0420 08/31/19 0326  WBC 19.6* 13.2*  NEUTROABS 16.3* 12.1*  HGB 8.4* 8.0*  HCT 28.1* 26.8*  MCV 97.2 97.8  PLT 204 196   Basic Metabolic Panel Recent Labs    99/37/16 0420 08/31/19 0326  NA 133* 131*  K 3.4* 3.8  CL 90* 86*  CO2 31 31  GLUCOSE 124* 134*  BUN 26*  36*  CREATININE 1.09 1.40*  CALCIUM 8.4* 8.9  MG 2.1 1.9   Liver Function Tests Recent Labs    08/29/19 0817  AST 33  ALT 13  ALKPHOS 123  BILITOT 3.2*  PROT 7.0  ALBUMIN 2.1*   No results for input(s): LIPASE, AMYLASE in the last 72 hours. Cardiac Enzymes No results for input(s): CKTOTAL, CKMB, CKMBINDEX, TROPONINI in the last 72 hours.  BNP: BNP (last 3 results) Recent Labs    03/21/19 1415 04/23/19 1350  BNP 426.9* 709.6*    ProBNP (last 3 results) No results for input(s): PROBNP in the last 8760 hours.   D-Dimer No results for input(s): DDIMER in  the last 72 hours. Hemoglobin A1C No results for input(s): HGBA1C in the last 72 hours. Fasting Lipid Panel No results for input(s): CHOL, HDL, LDLCALC, TRIG, CHOLHDL, LDLDIRECT in the last 72 hours. Thyroid Function Tests No results for input(s): TSH, T4TOTAL, T3FREE, THYROIDAB in the last 72 hours.  Invalid input(s): FREET3  Other results:   Imaging    No results found.   Medications:     Scheduled Medications: . amiodarone  200 mg Oral BID  . vitamin C  250 mg Oral Daily  . bisacodyl  10 mg Oral Daily  . Chlorhexidine Gluconate Cloth  6 each Topical Daily  . docusate sodium  100 mg Oral TID  . DULoxetine  30 mg Oral Daily  . feeding supplement (ENSURE ENLIVE)  237 mL Oral TID BM  . feeding supplement (PRO-STAT SUGAR FREE 64)  30 mL Oral BID  . ferrous sulfate  325 mg Oral Q breakfast  . furosemide  80 mg Intravenous BID  . gabapentin  300 mg Oral TID  . hydrALAZINE  75 mg Oral TID  . HYDROmorphone   Intravenous Q4H  . linaclotide  145 mcg Oral Daily  . mouth rinse  15 mL Mouth Rinse BID  . multivitamin with minerals  1 tablet Oral Daily  . pantoprazole  40 mg Oral BID  . potassium chloride  40 mEq Oral Once  . senna-docusate  1 tablet Oral QHS  . sildenafil  20 mg Oral TID  . sodium chloride flush  10-40 mL Intracatheter Q12H  . sodium chloride flush  10-40 mL Intracatheter Q12H  . sodium chloride flush  3 mL Intravenous Q12H  . Warfarin - Pharmacist Dosing Inpatient   Does not apply q1600  . zinc sulfate  220 mg Oral Daily    Infusions: . sodium chloride 10 mL/hr at 08/30/19 1000  . ceFEPime (MAXIPIME) IV Stopped (08/31/19 0405)  . dextrose 5 % and 0.45% NaCl Stopped (08/25/19 1414)  . lactated ringers    . magnesium sulfate bolus IVPB    . phenylephrine (NEO-SYNEPHRINE) Adult infusion    . vancomycin Stopped (08/31/19 0209)    PRN Medications: sodium chloride, acetaminophen, albuterol, alum & mag hydroxide-simeth, bisacodyl **OR** bisacodyl,  diphenhydrAMINE **OR** diphenhydrAMINE, guaiFENesin-dextromethorphan, hydrOXYzine, magnesium hydroxide, naloxone **AND** sodium chloride flush, ondansetron (ZOFRAN) IV, ondansetron (ZOFRAN) IV, oxyCODONE, sodium chloride flush, sodium chloride flush, sodium chloride flush, traMADol    Assessment/Plan    1. Fever & leukocytosis -> MSSA bacteremia/ Subxiphoid Abscess  - bcx 2/2 MSSA - ID has changed antibiotics to cefazolin, now has PICC.   - WBC trending down, 26>>19>>13. AF  - He is not a candidate for VAD exchange due to severe RV failure and noncompliance.  - S/p I&D on 4/5. Wound cultures +MSSA.  - Back to OR 4/7, 4/9, 4/15  debridement and irrigation of driveline + packing, wound vac in place.    - Plan for return trip to OR 4/19 -  ID recommends 6 weeks of IV cefazolin followed by long-term suppressive oral cephalexin. However, with high fever on 4/13, abx broadened to vancomycin/cefepime which continue  - on PCA pump for pain control, still requiring doses but using less w/ constipation.   2.Acute on chronic systolic HF - EF 78% also has severe RV failure.  S/p HM-3 LVAD on 09/06/17 - On sildenafil for RV failure.  - Co-ox 54% with CVP 13-14 today.  Weight up slightly from perioperative fluids.  Will continue Lasix 80 mg IV bid. Hold additional metolazone given mild AKI   3. HM3 LVAD 09/06/2017. - VAD interrogated personally. Parameters stable. - LDH 187  today  - INR 2.7 on warfarin.  - Off ASA with PUD.    4. Essential HTN - MAPs in the 88s.  - Continue hydralazine 75 mg tid. - Continue sildenafil 20 mg tid     5. AKI - Bump in SCr from 1.0>>1.4 - Hold metolazone today. Continue IV Lasix - Follow BMP   6. OSA - sleep study with very severe OSA (AHI 69/hr) - non-compliant Bipap in the past, has refused to use.  - Will see if he would be willing to re-try this admission.   7. H/o GI bleed  - 08/2018 EGD completed and showed gastritis and nonbleeding duodenal  ulcers, colonoscopy with 5 polyps removed. Protonix was increased to BID.  - No recent GI bleeding - hgb 8.0 - He will stay off aspirin for now.   8. Tobacco Abuse  - says he quit recently  61. Severe protein calorie malnutrition  - Albumin 2.1 on 4/14 - nutrition seeing  10. Constipation  - Abd plain film 4/12 unremarkable  - suspect related to pain meds - also h/o IBS  - improving    Length of Stay: [address] PA-C  08/31/2019, 7:46 AM  Advanced Heart Failure Team Pager [phone] (M-F; 7a - 4p)  Please contact [facility] for night-coverage after hours (4p -7a ) and weekends on [email or web address] 7:46 AM   Patient seen with PA, agree with the above note.   Some diuresis yesterday but CVP still high, at least 15.  Breathing ok.  Still some pain at surgical site.    General: Well appearing this am. NAD.  HEENT: Normal. Neck: Supple, JVP 14 cm. Carotids OK.  Cardiac:  Mechanical heart sounds with LVAD hum present.  Lungs:  CTAB, normal effort.  Abdomen:  NT, ND, no HSM. No bruits or masses. +BS  LVAD exit site: Well-healed and incorporated. Dressing dry and intact. No erythema or drainage. Stabilization device present and accurately applied. Driveline dressing changed daily per sterile technique. Extremities:  Warm and dry. No cyanosis, clubbing, rash. 1+ ankle edema.  Neuro:  Alert & oriented x 3. Cranial nerves grossly intact. Moves all 4 extremities w/o difficulty. Affect pleasant    Mild rise in creatinine to 1.4 but still volume overloaded.  Will give a dose of metolazone 2.5 x 1 again today along with Lasix 80 mg IV bid.   Transitioned abx back to cefazolin today for MSSA.   Plan for OR Monday to change wound vac.   Loralie Champagne 08/31/2019 1:51 PM

## 2019-08-31 NOTE — Progress Notes (Signed)
1 Day Post-Op Procedure(s) (LRB): APPLICATION OF WOUND VAC (N/A) Debridement Abdominal Wound (N/A) Subjective: Pain controlled Wound VAC function normal- min serosanguinous drainage Off pressors and WBC improved Objective: Vital signs in last 24 hours: Temp:  [97.4 F (36.3 C)-98.2 F (36.8 C)] 97.7 F (36.5 C) (04/16 0729) Pulse Rate:  [80-216] 105 (04/16 0700) Cardiac Rhythm: Normal sinus rhythm (04/16 0404) Resp:  [16-47] 30 (04/16 1021) BP: (84-109)/(60-86) 90/61 (04/16 0700) SpO2:  [82 %-100 %] 94 % (04/16 1021) Weight:  [99.9 kg] 99.9 kg (04/16 0600)  Hemodynamic parameters for last 24 hours: CVP:  [18 mmHg-25 mmHg] 20 mmHg  Intake/Output from previous day: 04/15 0701 - 04/16 0700 In: 2307.9 [P.O.:360; I.V.:648.3; IV Piggyback:1299.6] Out: 2500 [Urine:2450; Blood:50] Intake/Output this shift: No intake/output data recorded.       Exam    General- alert and comfortable    Neck- no JVD, no cervical adenopathy palpable, no carotid bruit   Lungs- clear without rales, wheezes   Cor- regular rate and rhythm, normal VAD Hum, no   Extremities - warm, non-tender, minimal edema   Neuro- oriented, appropriate, no focal weakness   Abd- wound vac sponge compressed   Lab Results: Recent Labs    08/30/19 0420 08/31/19 0326  WBC 19.6* 13.2*  HGB 8.4* 8.0*  HCT 28.1* 26.8*  PLT 204 196   BMET:  Recent Labs    08/30/19 0420 08/31/19 0326  NA 133* 131*  K 3.4* 3.8  CL 90* 86*  CO2 31 31  GLUCOSE 124* 134*  BUN 26* 36*  CREATININE 1.09 1.40*  CALCIUM 8.4* 8.9    PT/INR:  Recent Labs    08/31/19 0326  LABPROT 28.2*  INR 2.7*   ABG    Component Value Date/Time   PHART 7.405 09/08/2017 0049   HCO3 23.4 09/08/2017 0049   TCO2 32 09/12/2017 1605   ACIDBASEDEF 1.0 09/08/2017 0049   O2SAT 53.5 08/31/2019 0326   CBG (last 3)  No results for input(s): GLUCAP in the last 72 hours.  Assessment/Plan: S/P Procedure(s) (LRB): APPLICATION OF WOUND VAC  (N/A) Debridement Abdominal Wound (N/A) Wound VAC change Monday in OR    LOS: 15 days    Jonathan Wood 08/31/2019

## 2019-08-31 NOTE — Progress Notes (Addendum)
Updated mother on patient's status. Thanked nurse. Says she will try and visit him on tomorrow and to tell him that she loves him when he wakes up.   0420- Blood drawn through picc line using in line sampling device and sent to lab.

## 2019-08-31 NOTE — Progress Notes (Signed)
CSW met briefly with patient at bedside. Patient states that he is feeling better today than yesterday but still sleepy. Patient hopeful for continued improvement and looking forward to going home and enjoying the warm weather. Patient shared about missing his children as they are unable to visit due to covid restrictions. CSW provided supportive interventions and will be available as needed. [name], [place], [place]

## 2019-08-31 NOTE — Plan of Care (Signed)
  Problem: Education: Goal: Knowledge of General Education information will improve Description: Including pain rating scale, medication(s)/side effects and non-pharmacologic comfort measures 08/31/2019 1821 by Luna Kitchens, RN Outcome: Progressing 08/31/2019 1821 by Luna Kitchens, RN Outcome: Progressing   Problem: Health Behavior/Discharge Planning: Goal: Ability to manage health-related needs will improve Outcome: Progressing   Problem: Clinical Measurements: Goal: Ability to maintain clinical measurements within normal limits will improve Outcome: Progressing Goal: Will remain free from infection Outcome: Progressing Goal: Diagnostic test results will improve Outcome: Progressing Goal: Cardiovascular complication will be avoided Outcome: Progressing   Problem: Nutrition: Goal: Adequate nutrition will be maintained Outcome: Progressing   Problem: Coping: Goal: Level of anxiety will decrease Outcome: Progressing   Problem: Elimination: Goal: Will not experience complications related to bowel motility Outcome: Progressing   Problem: Pain Managment: Goal: General experience of comfort will improve Outcome: Progressing   Problem: Safety: Goal: Ability to remain free from injury will improve Outcome: Progressing   Problem: Skin Integrity: Goal: Risk for impaired skin integrity will decrease Outcome: Progressing   Problem: Cardiac: Goal: LVAD will function as expected and patient will experience no clinical alarms Outcome: Progressing   Problem: Education: Goal: Patient will understand all VAD equipment and how it functions Outcome: Progressing Goal: Patient will be able to verbalize current INR target range and antiplatelet therapy for discharge home Outcome: Progressing   Problem: Cardiac: Goal: LVAD will function as expected and patient will experience no clinical alarms Outcome: Progressing   Problem: Education: Goal: Knowledge of the prescribed  therapeutic regimen will improve Outcome: Progressing   Problem: Fluid Volume: Goal: Risk for excess fluid volume will decrease Outcome: Progressing   Problem: Clinical Measurements: Goal: Postoperative complications will be avoided or minimized Outcome: Progressing   Problem: Skin Integrity: Goal: Demonstration of wound healing without infection will improve Outcome: Progressing

## 2019-08-31 NOTE — Anesthesia Postprocedure Evaluation (Addendum)
Anesthesia Post Note  Patient: Jonathan Wood  Procedure(s) Performed: APPLICATION OF WOUND VAC (N/A Abdomen) Debridement Abdominal Wound (N/A Abdomen)     Patient location during evaluation: SICU Anesthesia Type: General Level of consciousness: awake and alert Pain management: pain level controlled Vital Signs Assessment: post-procedure vital signs reviewed and stable Respiratory status: spontaneous breathing, nonlabored ventilation, respiratory function stable and patient connected to nasal cannula oxygen Cardiovascular status: blood pressure returned to baseline and stable Postop Assessment: no apparent nausea or vomiting Anesthetic complications: no    Last Vitals:  Vitals:   08/31/19 0600 08/31/19 0700  BP: 109/60 90/61  Pulse: 80 (!) 105  Resp: (!) 23 (!) 27  Temp:    SpO2: 99% 98%    Last Pain:  Vitals:   08/31/19 0400  TempSrc: Oral  PainSc: 4                  Ival Basquez S

## 2019-09-01 DIAGNOSIS — B9561 Methicillin susceptible Staphylococcus aureus infection as the cause of diseases classified elsewhere: Secondary | ICD-10-CM

## 2019-09-01 DIAGNOSIS — R7881 Bacteremia: Secondary | ICD-10-CM

## 2019-09-01 LAB — LACTATE DEHYDROGENASE: LDH: 161 U/L (ref 98–192)

## 2019-09-01 LAB — CBC WITH DIFFERENTIAL/PLATELET
Abs Immature Granulocytes: 0.15 10*3/uL — ABNORMAL HIGH (ref 0.00–0.07)
Basophils Absolute: 0 10*3/uL (ref 0.0–0.1)
Basophils Relative: 0 %
Eosinophils Absolute: 0 10*3/uL (ref 0.0–0.5)
Eosinophils Relative: 0 %
HCT: 26.3 % — ABNORMAL LOW (ref 39.0–52.0)
Hemoglobin: 7.8 g/dL — ABNORMAL LOW (ref 13.0–17.0)
Immature Granulocytes: 1 %
Lymphocytes Relative: 10 %
Lymphs Abs: 1.3 10*3/uL (ref 0.7–4.0)
MCH: 28.7 pg (ref 26.0–34.0)
MCHC: 29.7 g/dL — ABNORMAL LOW (ref 30.0–36.0)
MCV: 96.7 fL (ref 80.0–100.0)
Monocytes Absolute: 1.2 10*3/uL — ABNORMAL HIGH (ref 0.1–1.0)
Monocytes Relative: 10 %
Neutro Abs: 10.2 10*3/uL — ABNORMAL HIGH (ref 1.7–7.7)
Neutrophils Relative %: 79 %
Platelets: 223 10*3/uL (ref 150–400)
RBC: 2.72 MIL/uL — ABNORMAL LOW (ref 4.22–5.81)
RDW: 17.4 % — ABNORMAL HIGH (ref 11.5–15.5)
WBC: 13 10*3/uL — ABNORMAL HIGH (ref 4.0–10.5)
nRBC: 0.2 % (ref 0.0–0.2)

## 2019-09-01 LAB — BASIC METABOLIC PANEL
Anion gap: 13 (ref 5–15)
BUN: 44 mg/dL — ABNORMAL HIGH (ref 6–20)
CO2: 29 mmol/L (ref 22–32)
Calcium: 8.5 mg/dL — ABNORMAL LOW (ref 8.9–10.3)
Chloride: 90 mmol/L — ABNORMAL LOW (ref 98–111)
Creatinine, Ser: 1.45 mg/dL — ABNORMAL HIGH (ref 0.61–1.24)
GFR calc Af Amer: >60 mL/min (ref >60)
GFR calc non Af Amer: 57 mL/min — ABNORMAL LOW (ref >60)
Glucose, Bld: 207 mg/dL — ABNORMAL HIGH (ref 70–99)
Potassium: 2.9 mmol/L — ABNORMAL LOW (ref 3.5–5.1)
Sodium: 132 mmol/L — ABNORMAL LOW (ref 135–145)

## 2019-09-01 LAB — MAGNESIUM: Magnesium: 2 mg/dL (ref 1.7–2.4)

## 2019-09-01 LAB — COOXEMETRY PANEL
Carboxyhemoglobin: 2.5 % — ABNORMAL HIGH (ref 0.5–1.5)
Methemoglobin: 1.1 % (ref 0.0–1.5)
O2 Saturation: 59.1 %
Total hemoglobin: 8.1 g/dL — ABNORMAL LOW (ref 12.0–16.0)

## 2019-09-01 LAB — PROTIME-INR
INR: 2.1 — ABNORMAL HIGH (ref 0.8–1.2)
Prothrombin Time: 23.1 seconds — ABNORMAL HIGH (ref 11.4–15.2)

## 2019-09-01 MED ORDER — POTASSIUM CHLORIDE CRYS ER 20 MEQ PO TBCR
40.0000 meq | EXTENDED_RELEASE_TABLET | Freq: Once | ORAL | Status: AC
  Administered 2019-09-01: 16:00:00 40 meq via ORAL
  Filled 2019-09-01: qty 2

## 2019-09-01 MED ORDER — METOLAZONE 2.5 MG PO TABS
2.5000 mg | ORAL_TABLET | Freq: Once | ORAL | Status: AC
  Administered 2019-09-01: 2.5 mg via ORAL
  Filled 2019-09-01: qty 1

## 2019-09-01 MED ORDER — WARFARIN SODIUM 3 MG PO TABS
3.0000 mg | ORAL_TABLET | Freq: Once | ORAL | Status: AC
  Administered 2019-09-01: 16:00:00 3 mg via ORAL
  Filled 2019-09-01: qty 1

## 2019-09-01 MED ORDER — POTASSIUM CHLORIDE CRYS ER 20 MEQ PO TBCR
40.0000 meq | EXTENDED_RELEASE_TABLET | Freq: Once | ORAL | Status: AC
  Administered 2019-09-01: 12:00:00 40 meq via ORAL
  Filled 2019-09-01: qty 2

## 2019-09-01 NOTE — Progress Notes (Signed)
Co ox redrawn and sent to lab.

## 2019-09-01 NOTE — Progress Notes (Signed)
Patient ID: Jonathan Wood, male   DOB: 05/12/1971, 49 y.o.   MRN: [id number]     Advanced Heart Failure Rounding Note  PCP-Cardiologist: Arvilla Meres, MD    Patient Profile   49 y.o. 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  Subjective:    - 4/5 S/p I&D w/ excisional debridement of subxiphoid abscess. Cultures + Staph Aureus  - 4/7, Returned to OR for debridement, Irrigation and Packing of Abdominal Incision - 4/9 S/P I&D abd wound with Acell and VAC application.  - 4/15 s/p I&D wound with VAC application.   Blood cultures positive for MSSA.  He continues cefazolin, has PICC in place.  WBC trending down, 24>>19>>13>>13.  Afebrile.   Co-ox 59%. Diuresing with IV Lasix + metolazone, I/Os net -1408 yesterday.  CVP still 18-19 today.  Creatinine 1.4 => 1.45.    Feels like it is hard to take a deep breath.   HM3 VAD Interrogation: Speed:5600 Flow:5.2 Power:4.3  PI:2.5. no PI events   CT of C/A/P 08/16/19 IMPRESSION: 1. New fluid collections along the efferent tubing from the VAD to the ascending aorta. Findings are suspicious for infection given clinical presentation and elevated white blood cell count. 2. Stable fluid surrounding the line within the left upper quadrant anterior abdominal wall, with decreased adjacent fat stranding. Infection in this region cannot be excluded. 3. Stable cardiomegaly. 4. Stable emphysema. 5. Otherwise no acute intra-abdominal or intrapelvic process.  Objective:   Weight Range: 99.5 kg Body mass index is 28.16 kg/m.   Vital Signs:   Temp:  [97.6 F (36.4 C)-98.7 F (37.1 C)] 98.7 F (37.1 C) (04/17 0730) Pulse Rate:  [86-104] 86 (04/17 0335) Resp:  [17-23] 19 (04/17 0335) BP: (80-105)/(61-84) 84/70 (04/17 0427) SpO2:  [97 %-98 %] 98 % (04/17 0730) Weight:  [99.5 kg]  99.5 kg (04/17 0427) Last BM Date: 08/31/19  Weight change: Filed Weights   08/30/19 0500 08/31/19 0600 09/01/19 0427  Weight: 98.2 kg 99.9 kg 99.5 kg    Intake/Output:   Intake/Output Summary (Last 24 hours) at 09/01/2019 1027 Last data filed at 09/01/2019 0935 Gross per 24 hour  Intake 1231.81 ml  Output 2750 ml  Net -1518.19 ml    Telemetry: NSR 80s-90s (personally reviewed)   Physical Exam   Physical Exam: CVP 18-19 General: Well appearing this am. NAD.  HEENT: Normal. Neck: Supple, JVP 14+ cm. Carotids OK.  Cardiac:  Mechanical heart sounds with LVAD hum present.  Lungs:  CTAB, normal effort.  Abdomen:  NT, ND, no HSM. No bruits or masses. +BS  LVAD exit site: Well-healed and incorporated. Dressing dry and intact. No erythema or drainage. Stabilization device present and accurately applied. Driveline dressing changed daily per sterile technique. Skin: Sternal area wound with wound vac applied.  Extremities:  Warm and dry. No cyanosis, clubbing, rash, or edema.  Neuro:  Alert & oriented x 3. Cranial nerves grossly intact. Moves all 4 extremities w/o difficulty. Affect pleasant     Labs    CBC Recent Labs    08/31/19 0326 09/01/19 0415  WBC 13.2* 13.0*  NEUTROABS 12.1* 10.2*  HGB 8.0* 7.8*  HCT 26.8* 26.3*  MCV 97.8 96.7  PLT 196 223   Basic Metabolic Panel Recent Labs    82/42/35 0326 09/01/19 0415  NA 131* 132*  K 3.8 2.9*  CL  86* 90*  CO2 31 29  GLUCOSE 134* 207*  BUN 36* 44*  CREATININE 1.40* 1.45*  CALCIUM 8.9 8.5*  MG 1.9 2.0   Liver Function Tests No results for input(s): AST, ALT, ALKPHOS, BILITOT, PROT, ALBUMIN in the last 72 hours. No results for input(s): LIPASE, AMYLASE in the last 72 hours. Cardiac Enzymes No results for input(s): CKTOTAL, CKMB, CKMBINDEX, TROPONINI in the last 72 hours.  BNP: BNP (last 3 results) Recent Labs    03/21/19 1415 04/23/19 1350  BNP 426.9* 709.6*    ProBNP (last 3 results) No results for  input(s): PROBNP in the last 8760 hours.   D-Dimer No results for input(s): DDIMER in the last 72 hours. Hemoglobin A1C No results for input(s): HGBA1C in the last 72 hours. Fasting Lipid Panel No results for input(s): CHOL, HDL, LDLCALC, TRIG, CHOLHDL, LDLDIRECT in the last 72 hours. Thyroid Function Tests No results for input(s): TSH, T4TOTAL, T3FREE, THYROIDAB in the last 72 hours.  Invalid input(s): FREET3  Other results:   Imaging    No results found.   Medications:     Scheduled Medications: . amiodarone  200 mg Oral BID  . vitamin C  250 mg Oral Daily  . bisacodyl  10 mg Oral Daily  . Chlorhexidine Gluconate Cloth  6 each Topical Daily  . docusate sodium  100 mg Oral TID  . DULoxetine  30 mg Oral Daily  . feeding supplement (ENSURE ENLIVE)  237 mL Oral TID BM  . feeding supplement (PRO-STAT SUGAR FREE 64)  30 mL Oral BID  . ferrous sulfate  325 mg Oral Q breakfast  . furosemide  80 mg Intravenous BID  . gabapentin  300 mg Oral TID  . hydrALAZINE  75 mg Oral TID  . linaclotide  145 mcg Oral Daily  . mouth rinse  15 mL Mouth Rinse BID  . metolazone  2.5 mg Oral Once  . multivitamin with minerals  1 tablet Oral Daily  . pantoprazole  40 mg Oral BID  . potassium chloride  40 mEq Oral Once  . potassium chloride  40 mEq Oral Once  . rifabutin  300 mg Oral Daily  . senna-docusate  1 tablet Oral QHS  . sildenafil  20 mg Oral TID  . sodium chloride flush  10-40 mL Intracatheter Q12H  . sodium chloride flush  10-40 mL Intracatheter Q12H  . sodium chloride flush  3 mL Intravenous Q12H  . Warfarin - Pharmacist Dosing Inpatient   Does not apply q1600  . zinc sulfate  220 mg Oral Daily    Infusions: . sodium chloride 10 mL/hr at 08/30/19 1000  .  ceFAZolin (ANCEF) IV 2 g (09/01/19 0425)  . dextrose 5 % and 0.45% NaCl Stopped (08/25/19 1414)  . lactated ringers    . phenylephrine (NEO-SYNEPHRINE) Adult infusion      PRN Medications: sodium chloride,  acetaminophen, albuterol, alum & mag hydroxide-simeth, bisacodyl **OR** bisacodyl, guaiFENesin-dextromethorphan, hydrOXYzine, magnesium hydroxide, ondansetron (ZOFRAN) IV, oxyCODONE, sodium chloride flush, sodium chloride flush, sodium chloride flush, traMADol    Assessment/Plan    1. Fever & leukocytosis -> MSSA bacteremia/ Subxiphoid Abscess  - bcx 2/2 MSSA - ID has changed antibiotics to cefazolin, now has PICC.   - WBC trending down, 26>>19>>13. AF  - He is not a candidate for VAD exchange due to severe RV failure and noncompliance.  - S/p I&D on 4/5. Wound cultures +MSSA.  - Back to OR 4/7, 4/9, 4/15 debridement and irrigation of  driveline + packing, wound vac in place.    - Plan for return trip to OR 4/19 for wound vac change.  - ID recommends 6 weeks of IV cefazolin followed by long-term suppressive oral cephalexin. He has PICC line. - Will need to consult plastic surgery for possible muscle flap.   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  S/p HM-3 LVAD on 09/06/17 - On sildenafil for RV failure.  - Co-ox 59% with CVP still high at 18-19 today.  Will continue Lasix 80 mg IV bid today and give metolazone 2.5 x 1.  Creatinine mildly higher at 1.45 today, may need a break tomorrow from aggressive diuresis to stabilize renal function. With RV failure, a good CVP for him will likely be in the 10-12 range.   3. HM3 LVAD 09/06/2017. - VAD interrogated personally. Parameters stable. - LDH 161 today  - INR 2.1 on warfarin.  - Off ASA with PUD.    4. Essential HTN - MAP 70s-80s  - Continue hydralazine 75 mg tid. - Continue sildenafil 20 mg tid     5. AKI - Bump in SCr from 1.0>>1.4>>1.45 - As above, will diurese today with CVP still quite high but probably cut back tomorrow.    6. OSA - sleep study with very severe OSA (AHI 69/hr) - non-compliant Bipap in the past, has refused to use.  - Will see if he would be willing to re-try  this admission.   7. H/o GI bleed  - 08/2018 EGD completed and showed gastritis and nonbleeding duodenal ulcers, colonoscopy with 5 polyps removed. Protonix was increased to BID.  - No recent GI bleeding - hgb 7.8 today, relatively stable.  No overt bleeding.  - Transfuse hgb < 7.  - He will stay off aspirin for now.   8. Tobacco Abuse  - says he quit recently  11. Severe protein calorie malnutrition  - Albumin 2.1 on 4/14 - nutrition seeing  10. Constipation  - Abd plain film 4/12 unremarkable  - suspect related to pain meds - also h/o IBS  - improving   Length of Stay: 97  Loralie Champagne, MD  09/01/2019, 10:27 AM  Advanced Heart Failure Team Pager [phone] (M-F; 7a - 4p)  Please contact [facility] for night-coverage after hours (4p -7a ) and weekends on [email or web address] 10:27 AM

## 2019-09-01 NOTE — Progress Notes (Signed)
Verified with L vad coordinator about the driveline dressing change  which she claimed is due for change on 4/22.

## 2019-09-01 NOTE — Progress Notes (Signed)
2 Days Post-Op Procedure(s) (LRB): APPLICATION OF WOUND VAC (N/A) Debridement Abdominal Wound (N/A) Subjective: Afebrile, minimal drainage from wound VAC, white count remains normal.  Antibiotics adjusted to Ancef to cover MSSA.  Most recent wound cultures negative. Plan return to the OR Monday for wound VAC change.  If wound appears to be granulating and cleaner,  will obtain consultation from reconstructive plastic surgery-Dr. Ulice Bold for possible muscle flap coverage of exposed outflow graft and proximal power cord.  Objective: Vital signs in last 24 hours: Temp:  [97.6 F (36.4 C)-98.7 F (37.1 C)] 98.7 F (37.1 C) (04/17 0730) Pulse Rate:  [86-104] 86 (04/17 0335) Cardiac Rhythm: Normal sinus rhythm (04/17 0730) Resp:  [13-30] 19 (04/17 0335) BP: (80-105)/(61-84) 84/70 (04/17 0427) SpO2:  [88 %-98 %] 98 % (04/17 0730) Weight:  [99.5 kg] 99.5 kg (04/17 0427)  Hemodynamic parameters for last 24 hours: CVP:  [13 mmHg-21 mmHg] 17 mmHg  Intake/Output from previous day: 04/16 0701 - 04/17 0700 In: 1041.8 [P.O.:480; IV Piggyback:561.8] Out: 2450 [Urine:2450] Intake/Output this shift: No intake/output data recorded.  Wound VAC sponge compressed Normal VAD hum 1-2+ peripheral edema  Lab Results: Recent Labs    08/31/19 0326 09/01/19 0415  WBC 13.2* 13.0*  HGB 8.0* 7.8*  HCT 26.8* 26.3*  PLT 196 223   BMET:  Recent Labs    08/31/19 0326 09/01/19 0415  NA 131* 132*  K 3.8 2.9*  CL 86* 90*  CO2 31 29  GLUCOSE 134* 207*  BUN 36* 44*  CREATININE 1.40* 1.45*  CALCIUM 8.9 8.5*    PT/INR:  Recent Labs    09/01/19 0415  LABPROT 23.1*  INR 2.1*   ABG    Component Value Date/Time   PHART 7.405 09/08/2017 0049   HCO3 23.4 09/08/2017 0049   TCO2 32 09/12/2017 1605   ACIDBASEDEF 1.0 09/08/2017 0049   O2SAT 59.1 09/01/2019 0621   CBG (last 3)  No results for input(s): GLUCAP in the last 72 hours.  Assessment/Plan: S/P Procedure(s) (LRB): APPLICATION OF  WOUND VAC (N/A) Debridement Abdominal Wound (N/A) Continue IV antibiotics and surgical wound care Wound VAC change under anesthesia Monday morning  LOS: 16 days    Kathlee Nations Trigt III 09/01/2019

## 2019-09-01 NOTE — Progress Notes (Signed)
ANTICOAGULATION CONSULT NOTE - Follow Up Consult  Pharmacy Consult for warfarin Indication: LVAD  No Known Allergies  Patient Measurements: Height: 6\' 2"  (188 cm) Weight: 99.5 kg (219 lb 5.7 oz) IBW/kg (Calculated) : 82.2  Vital Signs: Temp: 98.7 F (37.1 C) (04/17 0730) Temp Source: Oral (04/17 0730) BP: 84/70 (04/17 0427) Pulse Rate: 86 (04/17 0335)  Labs: Recent Labs    08/30/19 0420 08/30/19 0420 08/31/19 0326 09/01/19 0415  HGB 8.4*   < > 8.0* 7.8*  HCT 28.1*  --  26.8* 26.3*  PLT 204  --  196 223  LABPROT 27.3*  --  28.2* 23.1*  INR 2.5*  --  2.7* 2.1*  CREATININE 1.09  --  1.40* 1.45*   < > = values in this interval not displayed.    Estimated Creatinine Clearance: 80.2 mL/min (A) (by C-G formula based on SCr of 1.45 mg/dL (H)).   Medical History: Past Medical History:  Diagnosis Date  . Asthma   . CHF (congestive heart failure) (HCC)    a. 09/2016: EF 20-25% with cath showing normal cors  . GERD (gastroesophageal reflux disease)   . History of hiatal hernia   . OSA on CPAP 09/06/2018   Severe OSA with AHI 68/hr on CPAP at 12cm H2O    Assessment: 48yom with HF s/p LVAD implant HM3 place 4/19 admitted with drive line infection.    Warfarin PTA 7.5mg  MW and 5mg  all other days admit INR elevated at 5 - probably acute infection related supra-sensitive.   S/p OR on 4/15 for repeat I&D with application of wound vac.   INR today is above goal at 2.1, Hgb down slightly, LDH wnl. Note rifabutin added 4/16.  Goal of Therapy:  INR 2-2.5 Monitor platelets by anticoagulation protocol: Yes   Plan:  -Warfarin 3mg  PO x1 tonight -Daily INR   5/15, PharmD, BCPS Clinical Pharmacist [phone] Please check AMION for all [facility] Pharmacy numbers 09/01/2019

## 2019-09-02 LAB — CBC WITH DIFFERENTIAL/PLATELET
Abs Immature Granulocytes: 0.33 10*3/uL — ABNORMAL HIGH (ref 0.00–0.07)
Basophils Absolute: 0.1 10*3/uL (ref 0.0–0.1)
Basophils Relative: 1 %
Eosinophils Absolute: 0.1 10*3/uL (ref 0.0–0.5)
Eosinophils Relative: 1 %
HCT: 26.7 % — ABNORMAL LOW (ref 39.0–52.0)
Hemoglobin: 8 g/dL — ABNORMAL LOW (ref 13.0–17.0)
Immature Granulocytes: 3 %
Lymphocytes Relative: 15 %
Lymphs Abs: 1.8 10*3/uL (ref 0.7–4.0)
MCH: 28.6 pg (ref 26.0–34.0)
MCHC: 30 g/dL (ref 30.0–36.0)
MCV: 95.4 fL (ref 80.0–100.0)
Monocytes Absolute: 1.4 10*3/uL — ABNORMAL HIGH (ref 0.1–1.0)
Monocytes Relative: 12 %
Neutro Abs: 8.7 10*3/uL — ABNORMAL HIGH (ref 1.7–7.7)
Neutrophils Relative %: 68 %
Platelets: 230 10*3/uL (ref 150–400)
RBC: 2.8 MIL/uL — ABNORMAL LOW (ref 4.22–5.81)
RDW: 17.4 % — ABNORMAL HIGH (ref 11.5–15.5)
WBC: 12.4 10*3/uL — ABNORMAL HIGH (ref 4.0–10.5)
nRBC: 0 % (ref 0.0–0.2)

## 2019-09-02 LAB — BPAM RBC
Blood Product Expiration Date: 202105012359
Blood Product Expiration Date: 202105052359
ISSUE DATE / TIME: 202104051422
Unit Type and Rh: 7300
Unit Type and Rh: 7300

## 2019-09-02 LAB — BASIC METABOLIC PANEL
Anion gap: 12 (ref 5–15)
Anion gap: 12 (ref 5–15)
BUN: 44 mg/dL — ABNORMAL HIGH (ref 6–20)
BUN: 41 mg/dL — ABNORMAL HIGH (ref 6–20)
CO2: 32 mmol/L (ref 22–32)
CO2: 32 mmol/L (ref 22–32)
Calcium: 9 mg/dL (ref 8.9–10.3)
Calcium: 9 mg/dL (ref 8.9–10.3)
Chloride: 86 mmol/L — ABNORMAL LOW (ref 98–111)
Chloride: 88 mmol/L — ABNORMAL LOW (ref 98–111)
Creatinine, Ser: 1.37 mg/dL — ABNORMAL HIGH (ref 0.61–1.24)
Creatinine, Ser: 1.35 mg/dL — ABNORMAL HIGH (ref 0.61–1.24)
GFR calc Af Amer: >60 mL/min (ref >60)
GFR calc Af Amer: >60 mL/min (ref >60)
GFR calc non Af Amer: >60 mL/min (ref >60)
GFR calc non Af Amer: >60 mL/min (ref >60)
Glucose, Bld: 119 mg/dL — ABNORMAL HIGH (ref 70–99)
Glucose, Bld: 130 mg/dL — ABNORMAL HIGH (ref 70–99)
Potassium: 3 mmol/L — ABNORMAL LOW (ref 3.5–5.1)
Potassium: 3.1 mmol/L — ABNORMAL LOW (ref 3.5–5.1)
Sodium: 130 mmol/L — ABNORMAL LOW (ref 135–145)
Sodium: 132 mmol/L — ABNORMAL LOW (ref 135–145)

## 2019-09-02 LAB — LACTATE DEHYDROGENASE: LDH: 175 U/L (ref 98–192)

## 2019-09-02 LAB — TYPE AND SCREEN
ABO/RH(D): B POS
Antibody Screen: POSITIVE
DAT, IgG: POSITIVE
Donor AG Type: NEGATIVE
Unit division: 0
Unit division: 0

## 2019-09-02 LAB — COOXEMETRY PANEL
Carboxyhemoglobin: 2.1 % — ABNORMAL HIGH (ref 0.5–1.5)
Methemoglobin: 0.6 % (ref 0.0–1.5)
O2 Saturation: 54.6 %
Total hemoglobin: 7.3 g/dL — ABNORMAL LOW (ref 12.0–16.0)

## 2019-09-02 LAB — CULTURE, BLOOD (ROUTINE X 2)
Culture: NO GROWTH
Culture: NO GROWTH
Special Requests: ADEQUATE
Special Requests: ADEQUATE

## 2019-09-02 LAB — PROTIME-INR
INR: 1.4 — ABNORMAL HIGH (ref 0.8–1.2)
Prothrombin Time: 16.8 seconds — ABNORMAL HIGH (ref 11.4–15.2)

## 2019-09-02 LAB — HEPARIN LEVEL (UNFRACTIONATED): Heparin Unfractionated: <0.1 IU/mL — ABNORMAL LOW (ref 0.30–0.70)

## 2019-09-02 MED ORDER — HEPARIN (PORCINE) 25000 UT/250ML-% IV SOLN
500.0000 [IU]/h | INTRAVENOUS | Status: DC
  Administered 2019-09-02: 11:00:00 500 [IU]/h via INTRAVENOUS
  Filled 2019-09-02: qty 250

## 2019-09-02 MED ORDER — POTASSIUM CHLORIDE CRYS ER 20 MEQ PO TBCR
40.0000 meq | EXTENDED_RELEASE_TABLET | ORAL | Status: AC
  Administered 2019-09-02: 21:00:00 40 meq via ORAL
  Filled 2019-09-02 (×2): qty 2

## 2019-09-02 MED ORDER — WARFARIN SODIUM 4 MG PO TABS
4.0000 mg | ORAL_TABLET | Freq: Once | ORAL | Status: AC
  Administered 2019-09-02: 17:00:00 4 mg via ORAL
  Filled 2019-09-02: qty 1

## 2019-09-02 MED ORDER — WARFARIN SODIUM 5 MG PO TABS: 5.0000 mg | ORAL_TABLET | Freq: Once | ORAL | Status: DC

## 2019-09-02 MED ORDER — HEPARIN (PORCINE) 25000 UT/250ML-% IV SOLN: 500.0000 [IU]/h | INTRAVENOUS | Status: DC

## 2019-09-02 MED ORDER — POTASSIUM CHLORIDE CRYS ER 20 MEQ PO TBCR
40.0000 meq | EXTENDED_RELEASE_TABLET | ORAL | Status: AC
  Administered 2019-09-02 (×3): 40 meq via ORAL
  Filled 2019-09-02 (×3): qty 2

## 2019-09-02 MED ORDER — METOLAZONE 2.5 MG PO TABS
2.5000 mg | ORAL_TABLET | Freq: Once | ORAL | Status: AC
  Administered 2019-09-02: 11:00:00 2.5 mg via ORAL
  Filled 2019-09-02: qty 1

## 2019-09-02 NOTE — Anesthesia Preprocedure Evaluation (Addendum)
Anesthesia Evaluation  Patient identified by MRN, date of birth, ID band Patient awake    Reviewed: Allergy & Precautions, H&P , NPO status , Patient's Chart, lab work & pertinent test results  Airway Mallampati: III  TM Distance: >3 FB Neck ROM: Full  Mouth opening: Limited Mouth Opening  Dental no notable dental hx. (+) Dental Advisory Given, Poor Dentition   Pulmonary asthma , sleep apnea , COPD, Current Smoker and Patient abstained from smoking.,    Pulmonary exam normal breath sounds clear to auscultation       Cardiovascular Exercise Tolerance: Good hypertension, Pt. on medications +CHF   Rhythm:Regular Rate:Normal     Neuro/Psych Anxiety negative neurological ROS     GI/Hepatic Neg liver ROS, GERD  Medicated and Controlled,  Endo/Other  negative endocrine ROS  Renal/GU negative Renal ROS  negative genitourinary   Musculoskeletal   Abdominal   Peds  Hematology  (+) Blood dyscrasia, anemia ,   Anesthesia Other Findings   Reproductive/Obstetrics negative OB ROS                           Anesthesia Physical Anesthesia Plan  ASA: IV  Anesthesia Plan: General   Post-op Pain Management:    Induction: Intravenous  PONV Risk Score and Plan: 2 and Ondansetron, Dexamethasone and Midazolam  Airway Management Planned: Oral ETT  Additional Equipment:   Intra-op Plan:   Post-operative Plan: Extubation in OR  Informed Consent: I have reviewed the patients History and Physical, chart, labs and discussed the procedure including the risks, benefits and alternatives for the proposed anesthesia with the patient or authorized representative who has indicated his/her understanding and acceptance.     Dental advisory given  Plan Discussed with: CRNA  Anesthesia Plan Comments:         Anesthesia Quick Evaluation

## 2019-09-02 NOTE — Progress Notes (Signed)
ANTICOAGULATION CONSULT NOTE - Follow Up Consult  Pharmacy Consult for warfarin + heparin Indication: LVAD  No Known Allergies  Patient Measurements: Height: 6\' 2"  (188 cm) Weight: 98.3 kg (216 lb 11.4 oz) IBW/kg (Calculated) : 82.2  Vital Signs: Temp: 98.9 F (37.2 C) (04/18 1600) Temp Source: Oral (04/18 1600) Pulse Rate: 93 (04/18 1600)  Labs: Recent Labs    08/31/19 0326 08/31/19 0326 09/01/19 0415 09/02/19 0447 09/02/19 0532 09/02/19 1643  HGB 8.0*   < > 7.8* 8.0*  --   --   HCT 26.8*  --  26.3* 26.7*  --   --   PLT 196  --  223 230  --   --   LABPROT 28.2*  --  23.1* 16.8*  --   --   INR 2.7*  --  2.1* 1.4*  --   --   HEPARINUNFRC  --   --   --   --   --  <0.10*  CREATININE 1.40*   < > 1.45*  --  1.37* 1.35*   < > = values in this interval not displayed.    Estimated Creatinine Clearance: 79.5 mL/min (A) (by C-G formula based on SCr of 1.35 mg/dL (H)).   Medical History: Past Medical History:  Diagnosis Date  . Asthma   . CHF (congestive heart failure) (HCC)    a. 09/2016: EF 20-25% with cath showing normal cors  . GERD (gastroesophageal reflux disease)   . History of hiatal hernia   . OSA on CPAP 09/06/2018   Severe OSA with AHI 68/hr on CPAP at 12cm H2O    Assessment: 48yom with HF s/p LVAD implant HM3 place 4/19 admitted with drive line infection. S/p OR on 4/15 for repeat I&D with application of wound vac.   Warfarin PTA 7.5mg  MW and 5mg  all other days admit INR elevated at 5 - probably acute infection related supra-sensitive.   INR today down to 1.4, likely due to combination of holding late last week and rifabutin addition. Discussed with PVT, ok to start low-dose heparin and keep INR 1.8-2.2 with OR planned for tomorrow.  Heparin level undetectable this evening as expected. No rate change indicated.   Goal of Therapy:  INR 2-2.5 Monitor platelets by anticoagulation protocol: Yes   Plan:  -Continue heparin 500 units/h - no titrations for  now -Daily INR, CBC, heparin level  5/15 PharmD., BCPS Clinical Pharmacist 09/02/2019 6:41 PM

## 2019-09-02 NOTE — Progress Notes (Addendum)
ANTICOAGULATION CONSULT NOTE - Follow Up Consult  Pharmacy Consult for warfarin + heparin Indication: LVAD  No Known Allergies  Patient Measurements: Height: 6\' 2"  (188 cm) Weight: 98.3 kg (216 lb 11.4 oz) IBW/kg (Calculated) : 82.2  Vital Signs: Temp: 98 F (36.7 C) (04/18 0734) Temp Source: Oral (04/18 0734) BP: 86/59 (04/18 0359) Pulse Rate: 98 (04/18 0734)  Labs: Recent Labs    08/31/19 0326 08/31/19 0326 09/01/19 0415 09/02/19 0447 09/02/19 0532  HGB 8.0*   < > 7.8* 8.0*  --   HCT 26.8*  --  26.3* 26.7*  --   PLT 196  --  223 230  --   LABPROT 28.2*  --  23.1* 16.8*  --   INR 2.7*  --  2.1* 1.4*  --   CREATININE 1.40*  --  1.45*  --  1.37*   < > = values in this interval not displayed.    Estimated Creatinine Clearance: 78.3 mL/min (A) (by C-G formula based on SCr of 1.37 mg/dL (H)).   Medical History: Past Medical History:  Diagnosis Date  . Asthma   . CHF (congestive heart failure) (HCC)    a. 09/2016: EF 20-25% with cath showing normal cors  . GERD (gastroesophageal reflux disease)   . History of hiatal hernia   . OSA on CPAP 09/06/2018   Severe OSA with AHI 68/hr on CPAP at 12cm H2O    Assessment: 48yom with HF s/p LVAD implant HM3 place 4/19 admitted with drive line infection. S/p OR on 4/15 for repeat I&D with application of wound vac.   Warfarin PTA 7.5mg  MW and 5mg  all other days admit INR elevated at 5 - probably acute infection related supra-sensitive.   INR today down to 1.4, likely due to combination of holding late last week and rifabutin addition. Discussed with PVT, ok to start low-dose heparin and keep INR 1.8-2.2 with OR planned for tomorrow.  Goal of Therapy:  INR 2-2.5 Monitor platelets by anticoagulation protocol: Yes   Plan:  -Warfarin 4mg  PO x1 tonight -Add heparin 500 units/h - no titrations for now, check heparin level in 6h to ensure not high -Daily INR, CBC, heparin level   5/15, PharmD, BCPS Clinical  Pharmacist [phone] Please check AMION for all [facility] numbers 09/02/2019

## 2019-09-02 NOTE — Progress Notes (Signed)
Second time checking with patient to place on CPAP, however, patient states he knows how to place on himself. Informed patient to have RN call when he was ready if he needed assistance. CPAP at bedside with mask on patients bed. Sterile water filled to appropriate levels. Settings auto-titrate max 20 and min 8.

## 2019-09-02 NOTE — Progress Notes (Signed)
3 Days Post-Op Procedure(s) (LRB): APPLICATION OF WOUND VAC (N/A) Debridement Abdominal Wound (N/A) Subjective: Patient doing well with wound VAC and IV antibiotics INR has dipped below therapeutic range so patient will be started on low-dose heparin and stopped early in a.m. before wound debridement and wound VAC change  VAD parameters remain satisfactory  Objective: Vital signs in last 24 hours: Temp:  [97.8 F (36.6 C)-98.7 F (37.1 C)] 98 F (36.7 C) (04/18 0734) Pulse Rate:  [92-98] 98 (04/18 0734) Cardiac Rhythm: Other (Comment);Normal sinus rhythm (04/18 0800) Resp:  [18-29] 20 (04/18 0359) BP: (74-113)/(52-83) 86/59 (04/18 0359) SpO2:  [96 %-99 %] 98 % (04/18 0734) Weight:  [98.3 kg] 98.3 kg (04/18 0359)  Hemodynamic parameters for last 24 hours: CVP:  [17 mmHg-20 mmHg] 18 mmHg  Intake/Output from previous day: 04/17 0701 - 04/18 0700 In: 480 [P.O.:480] Out: 2175 [Urine:2100; Drains:75] Intake/Output this shift: Total I/O In: -  Out: 400 [Urine:400]  Exam Wound VAC sponge compressed and functioning Normal VAD hum Improved edema with diuresis Lab Results: Recent Labs    09/01/19 0415 09/02/19 0447  WBC 13.0* 12.4*  HGB 7.8* 8.0*  HCT 26.3* 26.7*  PLT 223 230   BMET:  Recent Labs    09/01/19 0415 09/02/19 0532  NA 132* 130*  K 2.9* 3.0*  CL 90* 86*  CO2 29 32  GLUCOSE 207* 119*  BUN 44* 44*  CREATININE 1.45* 1.37*  CALCIUM 8.5* 9.0    PT/INR:  Recent Labs    09/02/19 0447  LABPROT 16.8*  INR 1.4*   ABG    Component Value Date/Time   PHART 7.405 09/08/2017 0049   HCO3 23.4 09/08/2017 0049   TCO2 32 09/12/2017 1605   ACIDBASEDEF 1.0 09/08/2017 0049   O2SAT 54.6 09/02/2019 0550   CBG (last 3)  No results for input(s): GLUCAP in the last 72 hours.  Assessment/Plan: S/P Procedure(s) (LRB): APPLICATION OF WOUND VAC (N/A) Debridement Abdominal Wound (N/A) Plan return to the OR for wound VAC change under anesthesia.  When wound base  becomes clean and with good granulation tissue will ask for plastic reconstructive surgery consult for possible muscle flap to cover exposed surface of LVAD pump Continue oral Coumadin dosing for INR 1.8-2.2   LOS: 17 days    Jonathan Wood 09/02/2019

## 2019-09-02 NOTE — Progress Notes (Addendum)
Patient ID: Jonathan Wood, male   DOB: [birth year], 49 y.o.   MRN: [id number]     Advanced Heart Failure Rounding Note  PCP-Cardiologist: Arvilla Meres, MD    Patient Profile   49 y.o. 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  Subjective:    - 4/5 S/p I&D w/ excisional debridement of subxiphoid abscess. Cultures + Staph Aureus  - 4/7, Returned to OR for debridement, Irrigation and Packing of Abdominal Incision - 4/9 S/P I&D abd wound with Acell and VAC application.  - 4/15 s/p I&D wound with VAC application.   Blood cultures positive for MSSA.  He continues cefazolin, has PICC in place.  WBC trending down, 24>>19>>13>>13> 12.4 remains afebrile.  Co-ox 59%->55%. Diuresing with IV Lasix + metolazone, I/Os net -1695 yesterday.  CVP still 17-18 today.  Creatinine 1.4 => 1.45 -> 1.37.    Feels better today.  Abdomen less bloated.  Shortness of breath improved.  No orthopnea or PND.  Denies fevers or chills.  No leaking from wound VAC.  HM3 VAD Interrogation: Speed:5600 Flow:5.2 Power:4.3  PI:3.2.  No PI events.   Objective:   Weight Range: 98.3 kg Body mass index is 27.82 kg/m.   Vital Signs:   Temp:  [97.8 F (36.6 C)-98.7 F (37.1 C)] 98 F (36.7 C) (04/18 0734) Pulse Rate:  [92-98] 98 (04/18 0734) Resp:  [18-29] 20 (04/18 0359) BP: (74-113)/(52-83) 86/59 (04/18 0359) SpO2:  [96 %-99 %] 98 % (04/18 0734) Weight:  [98.3 kg] 98.3 kg (04/18 0359) Last BM Date: 09/01/19  Weight change: Filed Weights   08/31/19 0600 09/01/19 0427 09/02/19 0359  Weight: 99.9 kg 99.5 kg 98.3 kg    Intake/Output:   Intake/Output Summary (Last 24 hours) at 09/02/2019 1005 Last data filed at 09/02/2019 0433 Gross per 24 hour  Intake 240 ml  Output 1875 ml  Net -1635 ml    Telemetry: NSR 80-90s (personally reviewed)    Physical Exam   Physical Exam: CVP 17-18 General: Sitting up in bed.  NAD.  HEENT: normal  Neck: supple. JVP to jaw.  Carotids 2+ bilat; no bruits. No lymphadenopathy or thryomegaly appreciated. Cor: LVAD hum.  Lungs: Clear. Abdomen: Wound VAC in place.  Good seal.  No leak.  Obese soft, nontender, non-distended. No hepatosplenomegaly. No bruits or masses. Good bowel sounds. Driveline site clean. Anchor in place.  Extremities: no cyanosis, clubbing, rash. Warm no edema  Neuro: alert & oriented x 3. No focal deficits. Moves all 4 without problem   Labs    CBC Recent Labs    09/01/19 0415 09/02/19 0447  WBC 13.0* 12.4*  NEUTROABS 10.2* 8.7*  HGB 7.8* 8.0*  HCT 26.3* 26.7*  MCV 96.7 95.4  PLT 223 230   Basic Metabolic Panel Recent Labs    17/40/81 0326 08/31/19 0326 09/01/19 0415 09/02/19 0532  NA 131*   < > 132* 130*  K 3.8   < > 2.9* 3.0*  CL 86*   < > 90* 86*  CO2 31   < > 29 32  GLUCOSE 134*   < > 207* 119*  BUN 36*   < > 44* 44*  CREATININE 1.40*   < > 1.45* 1.37*  CALCIUM 8.9   < > 8.5* 9.0  MG 1.9  --  2.0  --    < > =  values in this interval not displayed.   Liver Function Tests No results for input(s): AST, ALT, ALKPHOS, BILITOT, PROT, ALBUMIN in the last 72 hours. No results for input(s): LIPASE, AMYLASE in the last 72 hours. Cardiac Enzymes No results for input(s): CKTOTAL, CKMB, CKMBINDEX, TROPONINI in the last 72 hours.  BNP: BNP (last 3 results) Recent Labs    03/21/19 1415 04/23/19 1350  BNP 426.9* 709.6*    ProBNP (last 3 results) No results for input(s): PROBNP in the last 8760 hours.   D-Dimer No results for input(s): DDIMER in the last 72 hours. Hemoglobin A1C No results for input(s): HGBA1C in the last 72 hours. Fasting Lipid Panel No results for input(s): CHOL, HDL, LDLCALC, TRIG, CHOLHDL, LDLDIRECT in the last 72 hours. Thyroid Function Tests No results for input(s): TSH, T4TOTAL, T3FREE, THYROIDAB in the last 72  hours.  Invalid input(s): FREET3  Other results:   Imaging    No results found.   Medications:     Scheduled Medications: . amiodarone  200 mg Oral BID  . vitamin C  250 mg Oral Daily  . bisacodyl  10 mg Oral Daily  . Chlorhexidine Gluconate Cloth  6 each Topical Daily  . docusate sodium  100 mg Oral TID  . DULoxetine  30 mg Oral Daily  . feeding supplement (ENSURE ENLIVE)  237 mL Oral TID BM  . feeding supplement (PRO-STAT SUGAR FREE 64)  30 mL Oral BID  . ferrous sulfate  325 mg Oral Q breakfast  . furosemide  80 mg Intravenous BID  . gabapentin  300 mg Oral TID  . hydrALAZINE  75 mg Oral TID  . linaclotide  145 mcg Oral Daily  . mouth rinse  15 mL Mouth Rinse BID  . multivitamin with minerals  1 tablet Oral Daily  . pantoprazole  40 mg Oral BID  . potassium chloride  40 mEq Oral Q3H  . rifabutin  300 mg Oral Daily  . senna-docusate  1 tablet Oral QHS  . sildenafil  20 mg Oral TID  . sodium chloride flush  10-40 mL Intracatheter Q12H  . sodium chloride flush  3 mL Intravenous Q12H  . Warfarin - Pharmacist Dosing Inpatient   Does not apply q1600  . zinc sulfate  220 mg Oral Daily    Infusions: . sodium chloride 10 mL/hr at 08/30/19 1000  .  ceFAZolin (ANCEF) IV 2 g (09/02/19 0433)  . dextrose 5 % and 0.45% NaCl Stopped (08/25/19 1414)  . lactated ringers    . phenylephrine (NEO-SYNEPHRINE) Adult infusion      PRN Medications: sodium chloride, acetaminophen, albuterol, alum & mag hydroxide-simeth, bisacodyl **OR** bisacodyl, guaiFENesin-dextromethorphan, hydrOXYzine, magnesium hydroxide, ondansetron (ZOFRAN) IV, oxyCODONE, sodium chloride flush, sodium chloride flush, traMADol    Assessment/Plan    1. Fever & leukocytosis -> MSSA bacteremia/ Subxiphoid Abscess  - bcx 2/2 MSSA - ID has changed antibiotics to cefazolin, now has PICC.   - WBC trending down.  Now 12.4.  Afebrile. - He is not a candidate for VAD exchange due to severe RV failure and  noncompliance.  - S/p I&D on 4/5. Wound cultures +MSSA.  - Back to OR 4/7, 4/9, 4/15 debridement and irrigation of driveline + packing, wound vac in place.    - Plan to return to the OR in a.m. for wound vac change or [place]..  - ID recommends 6 weeks of IV cefazolin followed by long-term suppressive oral cephalexin. He has PICC line. - Will need to  consult plastic surgery for possible muscle flap.  - INR is 1.4 today.  Will likely need low-dose heparin.  I will discuss with Dr. Darcey Nora. Discussed dosing with PharmD personally.  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  S/p HM-3 LVAD on 09/06/17 - On sildenafil for RV failure.  - Co-ox 55% with CVP still high at 17-18 today.  Will continue Lasix 80 mg IV bid today and repeat metolazone 2.5 x 1.  Creatinine stable today; to need to follow closely.. With RV failure, a good CVP for him will likely be in the 10-12 range.   3. HM3 LVAD 09/06/2017. - VAD interrogated personally. Parameters stable. - LDH 175 today  - INR 1.4 on warfarin.  Management as described above. - Off ASA with PUD.    4. Essential HTN - MAP 70s-80s  - Continue hydralazine 75 mg tid. - Continue sildenafil 20 mg tid     5. AKI - Bump in SCr from 1.0>>1.4>>1.45>1.37 - As above, will diurese today with CVP still quite high.  Continue to follow closely.  6. OSA - sleep study with very severe OSA (AHI 69/hr) - non-compliant Bipap in the past, has refused to use.  - He continues to tell respiratory that he will wear the machine himself.  But then he is noncompliant.  Discussed this with him again today.  7. H/o GI bleed  - 08/2018 EGD completed and showed gastritis and nonbleeding duodenal ulcers, colonoscopy with 5 polyps removed. Protonix was increased to BID.  - No recent GI bleeding - hgb 8.0 today, relatively stable.  No overt bleeding.  - Transfuse hgb < 7.  - He will stay off aspirin for now.   8. Tobacco Abuse   - says he quit recently  77. Severe protein calorie malnutrition  - Albumin 2.1 on 4/14 - nutrition seeing  10. Constipation  - Abd plain film 4/12 unremarkable  - suspect related to pain meds - also h/o IBS  - Stable today.  11. Hypokalemia -Has been supplemented.  Recheck in the afternoon.  Length of Stay: 17  [name], MD  09/02/2019, 10:05 AM  Advanced Heart Failure Team Pager [phone] (M-F; 7a - 4p)  Please contact [facility] for night-coverage after hours (4p -7a ) and weekends on [email or web address] 10:05 AM

## 2019-09-03 ENCOUNTER — Encounter (HOSPITAL_COMMUNITY)
Admission: EM | Disposition: A | Discharge status: Home Health | Payer: Self-pay | Source: Ambulatory Visit | Attending: Internal Medicine

## 2019-09-03 ENCOUNTER — Inpatient Hospital Stay (HOSPITAL_COMMUNITY): Payer: Medicare Other | Admitting: Anesthesiology

## 2019-09-03 ENCOUNTER — Encounter (HOSPITAL_COMMUNITY): Payer: Self-pay | Admitting: Internal Medicine

## 2019-09-03 DIAGNOSIS — Z978 Presence of other specified devices: Secondary | ICD-10-CM | POA: Diagnosis not present

## 2019-09-03 DIAGNOSIS — B9561 Methicillin susceptible Staphylococcus aureus infection as the cause of diseases classified elsewhere: Secondary | ICD-10-CM | POA: Diagnosis not present

## 2019-09-03 DIAGNOSIS — F411 Generalized anxiety disorder: Secondary | ICD-10-CM

## 2019-09-03 DIAGNOSIS — R7881 Bacteremia: Secondary | ICD-10-CM | POA: Diagnosis not present

## 2019-09-03 HISTORY — PX: APPLICATION OF A-CELL OF CHEST/ABDOMEN: SHX6302

## 2019-09-03 HISTORY — PX: APPLICATION OF WOUND VAC: SHX5189

## 2019-09-03 LAB — BASIC METABOLIC PANEL
Anion gap: 13 (ref 5–15)
BUN: 39 mg/dL — ABNORMAL HIGH (ref 6–20)
CO2: 31 mmol/L (ref 22–32)
Calcium: 9.1 mg/dL (ref 8.9–10.3)
Chloride: 88 mmol/L — ABNORMAL LOW (ref 98–111)
Creatinine, Ser: 1.26 mg/dL — ABNORMAL HIGH (ref 0.61–1.24)
GFR calc Af Amer: >60 mL/min (ref >60)
GFR calc non Af Amer: >60 mL/min (ref >60)
Glucose, Bld: 124 mg/dL — ABNORMAL HIGH (ref 70–99)
Potassium: 3.6 mmol/L (ref 3.5–5.1)
Sodium: 132 mmol/L — ABNORMAL LOW (ref 135–145)

## 2019-09-03 LAB — CBC WITH DIFFERENTIAL/PLATELET
Abs Immature Granulocytes: 0.81 10*3/uL — ABNORMAL HIGH (ref 0.00–0.07)
Basophils Absolute: 0.1 10*3/uL (ref 0.0–0.1)
Basophils Relative: 1 %
Eosinophils Absolute: 0.1 10*3/uL (ref 0.0–0.5)
Eosinophils Relative: 1 %
HCT: 28.7 % — ABNORMAL LOW (ref 39.0–52.0)
Hemoglobin: 8.7 g/dL — ABNORMAL LOW (ref 13.0–17.0)
Immature Granulocytes: 6 %
Lymphocytes Relative: 13 %
Lymphs Abs: 1.9 10*3/uL (ref 0.7–4.0)
MCH: 29.1 pg (ref 26.0–34.0)
MCHC: 30.3 g/dL (ref 30.0–36.0)
MCV: 96 fL (ref 80.0–100.0)
Monocytes Absolute: 1.6 10*3/uL — ABNORMAL HIGH (ref 0.1–1.0)
Monocytes Relative: 11 %
Neutro Abs: 10.3 10*3/uL — ABNORMAL HIGH (ref 1.7–7.7)
Neutrophils Relative %: 68 %
Platelets: 248 10*3/uL (ref 150–400)
RBC: 2.99 MIL/uL — ABNORMAL LOW (ref 4.22–5.81)
RDW: 18 % — ABNORMAL HIGH (ref 11.5–15.5)
WBC: 14.8 10*3/uL — ABNORMAL HIGH (ref 4.0–10.5)
nRBC: 0.3 % — ABNORMAL HIGH (ref 0.0–0.2)

## 2019-09-03 LAB — PROTIME-INR
INR: 1.5 — ABNORMAL HIGH (ref 0.8–1.2)
Prothrombin Time: 17.6 seconds — ABNORMAL HIGH (ref 11.4–15.2)

## 2019-09-03 LAB — HEPARIN LEVEL (UNFRACTIONATED)
Heparin Unfractionated: <0.1 IU/mL — ABNORMAL LOW (ref 0.30–0.70)
Heparin Unfractionated: <0.1 IU/mL — ABNORMAL LOW (ref 0.30–0.70)

## 2019-09-03 LAB — MAGNESIUM: Magnesium: 1.6 mg/dL — ABNORMAL LOW (ref 1.7–2.4)

## 2019-09-03 LAB — COOXEMETRY PANEL
Carboxyhemoglobin: 2.7 % — ABNORMAL HIGH (ref 0.5–1.5)
Methemoglobin: 0.9 % (ref 0.0–1.5)
O2 Saturation: 61.2 %
Total hemoglobin: 8.8 g/dL — ABNORMAL LOW (ref 12.0–16.0)

## 2019-09-03 LAB — LACTATE DEHYDROGENASE: LDH: 168 U/L (ref 98–192)

## 2019-09-03 SURGERY — APPLICATION, WOUND VAC
Anesthesia: General

## 2019-09-03 MED ORDER — LIDOCAINE 2% (20 MG/ML) 5 ML SYRINGE
INTRAMUSCULAR | Status: AC
  Filled 2019-09-03: qty 5

## 2019-09-03 MED ORDER — POTASSIUM CHLORIDE CRYS ER 20 MEQ PO TBCR
40.0000 meq | EXTENDED_RELEASE_TABLET | ORAL | Status: AC
  Administered 2019-09-03: 12:00:00 40 meq via ORAL
  Filled 2019-09-03: qty 2

## 2019-09-03 MED ORDER — FENTANYL CITRATE (PF) 250 MCG/5ML IJ SOLN
INTRAMUSCULAR | Status: AC
  Filled 2019-09-03: qty 5

## 2019-09-03 MED ORDER — ROCURONIUM BROMIDE 10 MG/ML (PF) SYRINGE
PREFILLED_SYRINGE | INTRAVENOUS | Status: DC | PRN
  Administered 2019-09-03: 40 mg via INTRAVENOUS

## 2019-09-03 MED ORDER — VANCOMYCIN HCL 1000 MG IV SOLR
INTRAVENOUS | Status: DC | PRN
  Administered 2019-09-03: 1000 mg via TOPICAL

## 2019-09-03 MED ORDER — HYDROMORPHONE HCL 1 MG/ML IJ SOLN: 0.2500 mg | INTRAMUSCULAR | Status: DC | PRN

## 2019-09-03 MED ORDER — ONDANSETRON HCL 4 MG/2ML IJ SOLN
INTRAMUSCULAR | Status: DC | PRN
  Administered 2019-09-03: 4 mg via INTRAVENOUS

## 2019-09-03 MED ORDER — MIDAZOLAM HCL 5 MG/5ML IJ SOLN
INTRAMUSCULAR | Status: DC | PRN
  Administered 2019-09-03: 2 mg via INTRAVENOUS

## 2019-09-03 MED ORDER — SPIRONOLACTONE 12.5 MG HALF TABLET
12.5000 mg | ORAL_TABLET | Freq: Every day | ORAL | Status: DC
  Administered 2019-09-04 – 2019-09-08 (×5): 12.5 mg via ORAL
  Filled 2019-09-03 (×5): qty 1

## 2019-09-03 MED ORDER — SUGAMMADEX SODIUM 200 MG/2ML IV SOLN
INTRAVENOUS | Status: DC | PRN
  Administered 2019-09-03: 200 mg via INTRAVENOUS

## 2019-09-03 MED ORDER — ETOMIDATE 2 MG/ML IV SOLN
INTRAVENOUS | Status: AC
  Filled 2019-09-03: qty 10

## 2019-09-03 MED ORDER — SODIUM CHLORIDE 0.9 % IR SOLN
Status: DC | PRN
  Administered 2019-09-03: 1000 mL

## 2019-09-03 MED ORDER — DEXAMETHASONE SODIUM PHOSPHATE 10 MG/ML IJ SOLN
INTRAMUSCULAR | Status: DC | PRN
  Administered 2019-09-03: 10 mg via INTRAVENOUS

## 2019-09-03 MED ORDER — VANCOMYCIN HCL 500 MG IV SOLR
INTRAVENOUS | Status: AC
  Filled 2019-09-03: qty 500

## 2019-09-03 MED ORDER — MIDAZOLAM HCL 2 MG/2ML IJ SOLN
INTRAMUSCULAR | Status: AC
  Filled 2019-09-03: qty 2

## 2019-09-03 MED ORDER — ETOMIDATE 2 MG/ML IV SOLN
INTRAVENOUS | Status: DC | PRN
  Administered 2019-09-03: 8 mg via INTRAVENOUS
  Administered 2019-09-03: 12 mg via INTRAVENOUS

## 2019-09-03 MED ORDER — VANCOMYCIN HCL 1000 MG IV SOLR
INTRAVENOUS | Status: AC
  Filled 2019-09-03: qty 1000

## 2019-09-03 MED ORDER — DEXAMETHASONE SODIUM PHOSPHATE 10 MG/ML IJ SOLN
INTRAMUSCULAR | Status: AC
  Filled 2019-09-03: qty 1

## 2019-09-03 MED ORDER — HEPARIN (PORCINE) 25000 UT/250ML-% IV SOLN
700.0000 [IU]/h | INTRAVENOUS | Status: AC
  Administered 2019-09-04: 700 [IU]/h via INTRAVENOUS
  Filled 2019-09-03: qty 250

## 2019-09-03 MED ORDER — TORSEMIDE 20 MG PO TABS
40.0000 mg | ORAL_TABLET | Freq: Every day | ORAL | Status: DC
  Administered 2019-09-03: 12:00:00 40 mg via ORAL
  Filled 2019-09-03: qty 2

## 2019-09-03 MED ORDER — ROCURONIUM BROMIDE 10 MG/ML (PF) SYRINGE
PREFILLED_SYRINGE | INTRAVENOUS | Status: AC
  Filled 2019-09-03: qty 10

## 2019-09-03 MED ORDER — FENTANYL CITRATE (PF) 100 MCG/2ML IJ SOLN
INTRAMUSCULAR | Status: DC | PRN
  Administered 2019-09-03 (×2): 50 ug via INTRAVENOUS

## 2019-09-03 MED ORDER — ONDANSETRON HCL 4 MG/2ML IJ SOLN
INTRAMUSCULAR | Status: AC
  Filled 2019-09-03: qty 2

## 2019-09-03 MED ORDER — PROPOFOL 10 MG/ML IV BOLUS
INTRAVENOUS | Status: AC
  Filled 2019-09-03: qty 20

## 2019-09-03 MED ORDER — MAGNESIUM SULFATE 4 GM/100ML IV SOLN
4.0000 g | Freq: Once | INTRAVENOUS | Status: AC
  Administered 2019-09-03: 13:00:00 4 g via INTRAVENOUS
  Filled 2019-09-03: qty 100

## 2019-09-03 SURGICAL SUPPLY — 59 items
APL SKNCLS STERI-STRIP NONHPOA (GAUZE/BANDAGES/DRESSINGS)
BENZOIN TINCTURE PRP APPL 2/3 (GAUZE/BANDAGES/DRESSINGS) IMPLANT
BLADE CLIPPER SURG (BLADE) ×2 IMPLANT
BLADE SURG 10 STRL SS (BLADE) IMPLANT
BLADE SURG 15 STRL LF DISP TIS (BLADE) IMPLANT
BLADE SURG 15 STRL SS (BLADE)
BNDG GAUZE ELAST 4 BULKY (GAUZE/BANDAGES/DRESSINGS) IMPLANT
CANISTER SUCT 3000ML PPV (MISCELLANEOUS) ×4 IMPLANT
CANISTER WOUND CARE 500ML ATS (WOUND CARE) ×4 IMPLANT
CLIP VESOCCLUDE SM WIDE 24/CT (CLIP) IMPLANT
CNTNR URN SCR LID CUP LEK RST (MISCELLANEOUS) IMPLANT
CONT SPEC 4OZ STRL OR WHT (MISCELLANEOUS)
COVER SURGICAL LIGHT HANDLE (MISCELLANEOUS) ×2 IMPLANT
DRAPE LAPAROSCOPIC ABDOMINAL (DRAPES) ×4 IMPLANT
DRAPE SLUSH/WARMER DISC (DRAPES) IMPLANT
DRAPE SURG 17X23 STRL (DRAPES) ×2 IMPLANT
DRSG CUTIMED SORBACT 7X9 (GAUZE/BANDAGES/DRESSINGS) ×2 IMPLANT
DRSG PAD ABDOMINAL 8X10 ST (GAUZE/BANDAGES/DRESSINGS) IMPLANT
DRSG VAC ATS LRG SENSATRAC (GAUZE/BANDAGES/DRESSINGS) ×2 IMPLANT
DRSG VAC ATS MED SENSATRAC (GAUZE/BANDAGES/DRESSINGS) ×4 IMPLANT
DRSG VAC ATS SM SENSATRAC (GAUZE/BANDAGES/DRESSINGS) ×2 IMPLANT
DRSG VERSA FOAM LRG 10X15 (GAUZE/BANDAGES/DRESSINGS) ×2 IMPLANT
ELECT REM PT RETURN 9FT ADLT (ELECTROSURGICAL) ×4
ELECTRODE REM PT RTRN 9FT ADLT (ELECTROSURGICAL) ×2 IMPLANT
GAUZE SPONGE 4X4 12PLY STRL (GAUZE/BANDAGES/DRESSINGS) IMPLANT
GAUZE XEROFORM 5X9 LF (GAUZE/BANDAGES/DRESSINGS) IMPLANT
GLOVE BIO SURGEON STRL SZ7.5 (GLOVE) ×8 IMPLANT
GOWN STRL REUS W/ TWL LRG LVL3 (GOWN DISPOSABLE) ×4 IMPLANT
GOWN STRL REUS W/TWL LRG LVL3 (GOWN DISPOSABLE) ×8
HANDPIECE INTERPULSE COAX TIP (DISPOSABLE) ×4
HEMOSTAT POWDER SURGIFOAM 1G (HEMOSTASIS) IMPLANT
HEMOSTAT SURGICEL 2X14 (HEMOSTASIS) IMPLANT
KIT BASIN OR (CUSTOM PROCEDURE TRAY) ×4 IMPLANT
KIT SUCTION CATH 14FR (SUCTIONS) IMPLANT
KIT TURNOVER KIT B (KITS) ×4 IMPLANT
MATRIX WOUND 3-LAYER 7X10 (Tissue) ×1 IMPLANT
MICROMATRIX 1000MG (Tissue) ×8 IMPLANT
MICROMATRIX 500MG (Tissue) ×4 IMPLANT
NS IRRIG 1000ML POUR BTL (IV SOLUTION) ×4 IMPLANT
PACK GENERAL/GYN (CUSTOM PROCEDURE TRAY) ×4 IMPLANT
PAD ARMBOARD 7.5X6 YLW CONV (MISCELLANEOUS) ×8 IMPLANT
SET HNDPC FAN SPRY TIP SCT (DISPOSABLE) ×2 IMPLANT
SOLUTION PARTIC MCRMTRX 1000MG (Tissue) IMPLANT
SOLUTION PARTIC MCRMTRX 500MG (Tissue) IMPLANT
STAPLER VISISTAT 35W (STAPLE) IMPLANT
SUT ETHILON 3 0 FSL (SUTURE) IMPLANT
SUT VIC AB 1 CTX 36 (SUTURE)
SUT VIC AB 1 CTX36XBRD ANBCTR (SUTURE) IMPLANT
SUT VIC AB 2-0 CTX 27 (SUTURE) IMPLANT
SUT VIC AB 3-0 SH 8-18 (SUTURE) ×2 IMPLANT
SUT VIC AB 3-0 X1 27 (SUTURE) IMPLANT
SWAB COLLECTION DEVICE MRSA (MISCELLANEOUS) IMPLANT
SWAB CULTURE ESWAB REG 1ML (MISCELLANEOUS) IMPLANT
SYR 5ML LL (SYRINGE) ×2 IMPLANT
TOWEL GREEN STERILE (TOWEL DISPOSABLE) ×4 IMPLANT
TOWEL GREEN STERILE FF (TOWEL DISPOSABLE) ×4 IMPLANT
TRAY FOLEY MTR SLVR 16FR STAT (SET/KITS/TRAYS/PACK) IMPLANT
WATER STERILE IRR 1000ML POUR (IV SOLUTION) ×4 IMPLANT
WOUND MATRIX 3-LAYER 7X10 (Tissue) ×1 IMPLANT

## 2019-09-03 NOTE — Op Note (Signed)
NAME: Jonathan Wood, Jonathan Wood MEDICAL RECORD MO:[id number] ACCOUNT [id number] DATE OF BIRTH:22-May-1970 FACILITY: [facility] LOCATION: [facility] PHYSICIAN:Timm Bonenberger VAN TRIGT III, MD  OPERATIVE REPORT  DATE OF PROCEDURE:  09/03/2019  PROCEDURES: 1.  Irrigation of abdominal wound - left ventricular assist device driveline tunnel. 2.  Application of ACell product. 3.  Application of wound VAC.  SURGEON:  Kerin Perna, MD  ANESTHESIA:  General.  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  DESCRIPTION OF PROCEDURE:  The patient was brought from preop holding where informed consent was documented, proper site discussed and issues regarding the procedure reviewed with the patient.  The patient was placed supine on the operating table and  general anesthesia was induced.  He remained stable.  During the entire procedure, the VAD coordinator was present to monitor the patient's equipment, as well as assist with managing hemodynamics.  After the patient was asleep, the previous wound VAC sponge was removed and the chest and open wound was prepped and draped as a sterile field.  A proper time-out was performed.  The wound was inspected.  It is very clean now.  It is 12 cm long x 3 cm wide x 4 cm deep.  There is still exposed outflow graft bend relief and proximal power cord at the base of the wound.  The wound was irrigated with a liter of vancomycin irrigation.   No sharp debridement was needed.  ACell paste was placed in the tunneling to the left upper quadrant and into the space between the outflow graft and the proximal power cord.  ACell powder was placed at the base of the wound and an ACell sheet was  tacked to  the tissue above the base of the wound.  A wound VAC sponge was then cut to the appropriate length and configuration.  A small portion of white VAC sponge was placed in the tunnel directed into the left upper quadrant.  The ACell sheaths were  placed, the suction line applied and there was good collapse of the sponge after engaging with the suction pump.  The patient was then reversed from anesthesia, monitored and returned to the recovery room accompanied by the VAD nurse coordinator and the  VAD equipment.  VN/NUANCE  D:09/03/2019 T:09/03/2019 JOB:[id number]

## 2019-09-03 NOTE — Plan of Care (Signed)
  Problem: Education: Goal: Knowledge of General Education information will improve Description Including pain rating scale, medication(s)/side effects and non-pharmacologic comfort measures Outcome: Progressing   

## 2019-09-03 NOTE — Progress Notes (Signed)
LVAD coordinator contacted pre-surgery

## 2019-09-03 NOTE — Consult Note (Signed)
[facility] Psychiatry Consult   Reason for Consult:  Severe anxiety Referring Physician:  [facility]ita [facility] Patient Identification: Jonathan Wood MRN:  [id number] Principal Diagnosis: Infection associated with driveline of left ventricular assist device (LVAD) ([place]) Diagnosis:  Principal Problem:   Infection associated with driveline of left ventricular assist device (LVAD) ([place]) Active Problems:   Generalized anxiety disorder   Elevated LFTs   LVAD (left ventricular assist device) present ([place])   Normocytic anemia   Chronic combined systolic (congestive) and diastolic (congestive) heart failure (HCC)   OSA on CPAP   Sepsis ([place])   Cigarette smoker   COPD (chronic obstructive pulmonary disease) ([place])   Hypertension   Bacteremia due to methicillin susceptible Staphylococcus aureus (MSSA)   Total Time spent with patient: 1 hour  Subjective:   Jonathan Wood is a 49 y.o. male patient admitted with infection, consult for anxeity.  Patient seen and evaluated in person by this provider.  He was calmly resting on the bed watching television with his mother at his bedside.  He reports that his anxiety at this time is "not that bad".  He said he was worried about his stomach issues and did not understand but now feels better about it.  He says in the past that Xanax did help calm him down and also helped him to sleep which is an issue currently.  He has used alcohol in the past and has a history of alcohol abuse.  Not a good choice considering may operate in the brain the same way along with dependency issues and respiratory depression issues.  He does say that the cough syrup to give him helps.  Once he goes to sleep he is able to maintain sleep, initiation is the issue.  Reviewed his medications and hydroxyzine 25 mg as needed for anxiety is already in place and can be utilized at night to assist him in sleep.  Cymbalta 30 mg can also be increased to 30 mg twice daily to assist  long-term along with sleep hygiene.  Denies suicidal/homicidal ideations, hallucinations, and recent substance abuse.  HPI per MD:  Jonathan Wood is a 49 y.o. 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  Admitted 08/21/2018 with volume overload and anemia. Diuresed with IV lasix and transitioned to torsemide 20 mg daily. GI consulted EGD completed and showed gastritis and nonbleeding duodenal ulcers, colonoscopy with 5 polyps removed. Protonix increased to BID, he was started on PO iron,and hydrocortisone suppositories for hemorrhoid treatment. He received a total of 2 uPRBCs. He will stay off ASA forseveral weeksbut can reconsider later.  Discharge weight was 221 pounds.   Past Psychiatric History:  Anxiety, alcohol abuse  Risk to Self:   Risk to Others:   Prior Inpatient Therapy:   Prior Outpatient Therapy:    Past Medical History:  Past Medical History:  Diagnosis Date  . Asthma   . CHF (congestive heart failure) ([place])    a. 09/2016: EF 20-25% with cath showing normal cors  . GERD (gastroesophageal reflux disease)   . History of hiatal hernia   . OSA on CPAP 09/06/2018   Severe OSA with AHI 68/hr on CPAP at 12cm H2O    Past Surgical History:  Procedure Laterality Date  . APPLICATION OF A-CELL OF CHEST/ABDOMEN N/A 08/24/2019   Procedure: Application Of A-Cell Of Chest/Abdomen;  Surgeon: Ivin Poot, MD;  Location: [place];  Service: Thoracic;  Laterality: N/A;  . APPLICATION OF WOUND VAC N/A 08/22/2019   Procedure: Debridement, Irrigation and Packing of Abdominal Incision.;  Surgeon: Ivin Poot, MD;  Location: [place];  Service: Thoracic;  Laterality: N/A;  . APPLICATION OF WOUND VAC N/A 08/24/2019   Procedure: Irrigation and Debridement with WOUND VAC APPLICATION;  Surgeon: Ivin Poot, MD;  Location: [place];  Service: Thoracic;  Laterality: N/A;  . APPLICATION OF WOUND VAC N/A  08/30/2019   Procedure: APPLICATION OF WOUND VAC;  Surgeon: Ivin Poot, MD;  Location: [place];  Service: Thoracic;  Laterality: N/A;  . BIOPSY  08/23/2018   Procedure: BIOPSY;  Surgeon: Irving Copas., MD;  Location: [place];  Service: Gastroenterology;;  . COLONOSCOPY N/A 08/23/2018   Procedure: COLONOSCOPY;  Surgeon: Irving Copas., MD;  Location: [place];  Service: Gastroenterology;  Laterality: N/A;  . ENTEROSCOPY N/A 08/23/2018   Procedure: ENTEROSCOPY;  Surgeon: Rush Landmark Telford Nab., MD;  Location: [place];  Service: Gastroenterology;  Laterality: N/A;  . HEMOSTASIS CLIP PLACEMENT  08/23/2018   Procedure: HEMOSTASIS CLIP PLACEMENT;  Surgeon: Irving Copas., MD;  Location: [place];  Service: Gastroenterology;;  . IABP INSERTION N/A 09/05/2017   Procedure: IABP INSERTION;  Surgeon: Jolaine Artist, MD;  Location: [place] [facility];  Service: Cardiovascular;  Laterality: N/A;  . INSERTION OF IMPLANTABLE LEFT VENTRICULAR ASSIST DEVICE N/A 09/06/2017   Procedure: INSERTION OF IMPLANTABLE LEFT VENTRICULAR ASSIST DEVICE - HM3;  Surgeon: Ivin Poot, MD;  Location: [place];  Service: Open Heart Surgery;  Laterality: N/A;  [place]  . POLYPECTOMY  08/23/2018   Procedure: POLYPECTOMY;  Surgeon: Mansouraty, Telford Nab., MD;  Location: [place];  Service: Gastroenterology;;  . RIGHT HEART CATH N/A 08/30/2017   Procedure: RIGHT HEART CATH;  Surgeon: Jolaine Artist, MD;  Location: [facility];  Service: Cardiovascular;  Laterality: N/A;  . RIGHT/LEFT HEART CATH AND CORONARY ANGIOGRAPHY N/A 10/08/2016   Procedure: Right/Left Heart Cath and Coronary Angiography;  Surgeon: Dixie Dials, MD;  Location: [place] [facility];  Service: Cardiovascular;  Laterality: N/A;  . STERNAL WOUND DEBRIDEMENT N/A 08/20/2019   Procedure: DEBRIDEMENT OF LVAD DRIVELINE TUNNEL;  Surgeon: Ivin Poot, MD;  Location: [place];  Service:  Thoracic;  Laterality: N/A;  . SUBMUCOSAL TATTOO INJECTION  08/23/2018   Procedure: SUBMUCOSAL TATTOO INJECTION;  Surgeon: Irving Copas., MD;  Location: [place];  Service: Gastroenterology;;  . TEE WITHOUT CARDIOVERSION N/A 09/06/2017   Procedure: TRANSESOPHAGEAL ECHOCARDIOGRAM (TEE);  Surgeon: Prescott Gum, Collier Salina, MD;  Location: [place];  Service: Open Heart Surgery;  Laterality: N/A;  . WOUND DEBRIDEMENT N/A 08/30/2019   Procedure: Debridement Abdominal Wound;  Surgeon: Ivin Poot, MD;  [facility];  Service: Thoracic;  Laterality: N/A;   Family History:  Family History  Problem Relation Age of Onset  . Hypertension Father   . Colon cancer Neg Hx   . Esophageal cancer Neg Hx   . Inflammatory bowel disease Neg Hx   . Liver disease Neg Hx   . Pancreatic cancer Neg Hx   . Rectal cancer Neg Hx   . Stomach cancer Neg Hx    Family Psychiatric  History: none Social History:  Social History   Substance and Sexual Activity  Alcohol Use Yes   Comment: occas     Social History   Substance and Sexual Activity  Drug Use No  Social History   Socioeconomic History  . Marital status: Legally Separated    Spouse name: Not on file  . Number of children: 3  . Years of education: Not on file  . Highest education level: Not on file  Occupational History  . Not on file  Tobacco Use  . Smoking status: Current Every Day Smoker    Packs/day: 0.50    Years: 25.00    Pack years: 12.50    Types: Cigarettes  . Smokeless tobacco: Never Used  . Tobacco comment: quit 1 week  Substance and Sexual Activity  . Alcohol use: Yes    Comment: occas  . Drug use: No  . Sexual activity: Yes  Other Topics Concern  . Not on file  Social History Narrative   Lives at home with his three children (17, 61, and 56 years of age).    Social Determinants of Health   Financial Resource Strain:   . Difficulty of Paying Living Expenses:   Food Insecurity:   . Worried About Ship broker in the Last Year:   . Arboriculturist in the Last Year:   Transportation Needs:   . Film/video editor (Medical):   Marland Kitchen Lack of Transportation (Non-Medical):   Physical Activity:   . Days of Exercise per Week:   . Minutes of Exercise per Session:   Stress:   . Feeling of Stress :   Social Connections:   . Frequency of Communication with Friends and Family:   . Frequency of Social Gatherings with Friends and Family:   . Attends Religious Services:   . Active Member of Clubs or Organizations:   . Attends Archivist Meetings:   Marland Kitchen Marital Status:    Additional Social History:    Allergies:  No Known Allergies  Labs:  Results for orders placed or performed during the hospital encounter of 08/16/19 (from the past 48 hour(s))  Protime-INR     Status: Abnormal   Collection Time: 09/02/19  4:47 AM  Result Value Ref Range   Prothrombin Time 16.8 (H) 11.4 - 15.2 seconds   INR 1.4 (H) 0.8 - 1.2    Comment: (NOTE) INR goal varies based on device and disease states. Performed at [facility], [place] [address], [place], [place] [address]   CBC with Differential/Platelet     Status: Abnormal   Collection Time: 09/02/19  4:47 AM  Result Value Ref Range   WBC 12.4 (H) 4.0 - 10.5 K/uL   RBC 2.80 (L) 4.22 - 5.81 MIL/uL   Hemoglobin 8.0 (L) 13.0 - 17.0 g/dL   HCT 26.7 (L) 39.0 - 52.0 %   MCV 95.4 80.0 - 100.0 fL   MCH 28.6 26.0 - 34.0 pg   MCHC 30.0 30.0 - 36.0 g/dL   RDW 17.4 (H) 11.5 - 15.5 %   Platelets 230 150 - 400 K/uL   nRBC 0.0 0.0 - 0.2 %   Neutrophils Relative % 68 %   Neutro Abs 8.7 (H) 1.7 - 7.7 K/uL   Lymphocytes Relative 15 %   Lymphs Abs 1.8 0.7 - 4.0 K/uL   Monocytes Relative 12 %   Monocytes Absolute 1.4 (H) 0.1 - 1.0 K/uL   Eosinophils Relative 1 %   Eosinophils Absolute 0.1 0.0 - 0.5 K/uL   Basophils Relative 1 %   Basophils Absolute 0.1 0.0 - 0.1 K/uL   Immature Granulocytes 3 %   Abs Immature Granulocytes 0.33 (H) 0.00 -  0.07 K/uL     Comment: Performed at [facility], [place] [address] [place], [place] [address]  Lactate dehydrogenase     Status: None   Collection Time: 09/02/19  5:32 AM  Result Value Ref Range   LDH 175 98 - 192 U/L    Comment: Performed at [facility], [place] [address], [place], [place] [address]  Basic metabolic panel     Status: Abnormal   Collection Time: 09/02/19  5:32 AM  Result Value Ref Range   Sodium 130 (L) 135 - 145 mmol/L   Potassium 3.0 (L) 3.5 - 5.1 mmol/L   Chloride 86 (L) 98 - 111 mmol/L   CO2 32 22 - 32 mmol/L   Glucose, Bld 119 (H) 70 - 99 mg/dL    Comment: Glucose reference range applies only to samples taken after fasting for at least 8 hours.   BUN 44 (H) 6 - 20 mg/dL   Creatinine, Ser 1.37 (H) 0.61 - 1.24 mg/dL   Calcium 9.0 8.9 - 10.3 mg/dL   GFR calc non Af Amer >60 >60 mL/min   GFR calc Af Amer >60 >60 mL/min   Anion gap 12 5 - 15    Comment: Performed at [facility] [address], [place], [place] [address]  .Cooxemetry Panel (carboxy, met, total hgb, O2 sat)     Status: Abnormal   Collection Time: 09/02/19  5:50 AM  Result Value Ref Range   Total hemoglobin 7.3 (L) 12.0 - 16.0 g/dL   O2 Saturation 54.6 %   Carboxyhemoglobin 2.1 (H) 0.5 - 1.5 %   Methemoglobin 0.6 0.0 - 1.5 %    Comment: Performed at [facility] [address] [place], [place] [address]  Type and screen [place]     Status: None (Preliminary result)   Collection Time: 09/02/19 12:57 PM  Result Value Ref Range   ABO/RH(D) B POS    Antibody Screen POS    Sample Expiration 09/05/2019,2359    Antibody Identification ANTI K    DAT, IgG NEG    Unit Number [id number]    Blood Component Type RED CELLS,LR    Unit division 00    Status of Unit ALLOCATED    Donor AG Type NEGATIVE FOR KELL ANTIGEN    Transfusion Status OK TO TRANSFUSE    Crossmatch Result COMPATIBLE    Unit Number [id number]    Blood Component Type RED CELLS,LR    Unit division 00     Status of Unit ALLOCATED    Donor AG Type NEGATIVE FOR KELL ANTIGEN    Transfusion Status OK TO TRANSFUSE    Crossmatch Result COMPATIBLE   Heparin level (unfractionated)     Status: Abnormal   Collection Time: 09/02/19  4:43 PM  Result Value Ref Range   Heparin Unfractionated <0.10 (L) 0.30 - 0.70 IU/mL    Comment: (NOTE) If heparin results are below expected values, and patient dosage has  been confirmed, suggest follow up testing of antithrombin III levels. Performed at [facility], [place] [address], [place], [place] [address]   Basic metabolic panel     Status: Abnormal   Collection Time: 09/02/19  4:43 PM  Result Value Ref Range   Sodium 132 (L) 135 - 145 mmol/L   Potassium 3.1 (L) 3.5 - 5.1 mmol/L   Chloride 88 (L) 98 - 111 mmol/L   CO2 32 22 - 32 mmol/L   Glucose,  Bld 130 (H) 70 - 99 mg/dL    Comment: Glucose reference range applies only to samples taken after fasting for at least 8 hours.   BUN 41 (H) 6 - 20 mg/dL   Creatinine, Ser 1.35 (H) 0.61 - 1.24 mg/dL   Calcium 9.0 8.9 - 10.3 mg/dL   GFR calc non Af Amer >60 >60 mL/min   GFR calc Af Amer >60 >60 mL/min   Anion gap 12 5 - 15    Comment: Performed at [facility] [address], [place], [place] [address]  Lactate dehydrogenase     Status: None   Collection Time: 09/03/19  5:00 AM  Result Value Ref Range   LDH 168 98 - 192 U/L    Comment: Performed at [facility], [place] [address] [place], [place] [address]  Protime-INR     Status: Abnormal   Collection Time: 09/03/19  5:00 AM  Result Value Ref Range   Prothrombin Time 17.6 (H) 11.4 - 15.2 seconds   INR 1.5 (H) 0.8 - 1.2    Comment: (NOTE) INR goal varies based on device and disease states. Performed at [facility], [place] [address] [place], [place] [address]   Cooxemetry Panel (carboxy, met, total hgb, O2 sat)     Status: Abnormal   Collection Time: 09/03/19  5:00 AM  Result Value Ref Range   Total hemoglobin 8.8 (L) 12.0 - 16.0  g/dL   O2 Saturation 61.2 %   Carboxyhemoglobin 2.7 (H) 0.5 - 1.5 %   Methemoglobin 0.9 0.0 - 1.5 %    Comment: Performed at [facility] [address], [place], [place] [address]  Basic metabolic panel     Status: Abnormal   Collection Time: 09/03/19  5:00 AM  Result Value Ref Range   Sodium 132 (L) 135 - 145 mmol/L   Potassium 3.6 3.5 - 5.1 mmol/L   Chloride 88 (L) 98 - 111 mmol/L   CO2 31 22 - 32 mmol/L   Glucose, Bld 124 (H) 70 - 99 mg/dL    Comment: Glucose reference range applies only to samples taken after fasting for at least 8 hours.   BUN 39 (H) 6 - 20 mg/dL   Creatinine, Ser 1.26 (H) 0.61 - 1.24 mg/dL   Calcium 9.1 8.9 - 10.3 mg/dL   GFR calc non Af Amer >60 >60 mL/min   GFR calc Af Amer >60 >60 mL/min   Anion gap 13 5 - 15    Comment: Performed at [facility] [address] [place], [place] [address]  CBC with Differential/Platelet     Status: Abnormal   Collection Time: 09/03/19  5:00 AM  Result Value Ref Range   WBC 14.8 (H) 4.0 - 10.5 K/uL   RBC 2.99 (L) 4.22 - 5.81 MIL/uL   Hemoglobin 8.7 (L) 13.0 - 17.0 g/dL   HCT 28.7 (L) 39.0 - 52.0 %   MCV 96.0 80.0 - 100.0 fL   MCH 29.1 26.0 - 34.0 pg   MCHC 30.3 30.0 - 36.0 g/dL   RDW 18.0 (H) 11.5 - 15.5 %   Platelets 248 150 - 400 K/uL   nRBC 0.3 (H) 0.0 - 0.2 %   Neutrophils Relative % 68 %   Neutro Abs 10.3 (H) 1.7 - 7.7 K/uL   Lymphocytes Relative 13 %   Lymphs Abs 1.9 0.7 - 4.0 K/uL   Monocytes Relative 11 %   Monocytes Absolute 1.6 (H)  0.1 - 1.0 K/uL   Eosinophils Relative 1 %   Eosinophils Absolute 0.1 0.0 - 0.5 K/uL   Basophils Relative 1 %   Basophils Absolute 0.1 0.0 - 0.1 K/uL   WBC Morphology See Note     Comment: Mild Left Shift. 1 to 5% Metas and Myelos, Occ Pro Noted.   Immature Granulocytes 6 %   Abs Immature Granulocytes 0.81 (H) 0.00 - 0.07 K/uL   Polychromasia PRESENT    Basophilic Stippling PRESENT     Comment: Performed at [facility] [address]  [place], [place] [address]  Heparin level (unfractionated)     Status: Abnormal   Collection Time: 09/03/19  5:00 AM  Result Value Ref Range   Heparin Unfractionated <0.10 (L) 0.30 - 0.70 IU/mL    Comment: (NOTE) If heparin results are below expected values, and patient dosage has  been confirmed, suggest follow up testing of antithrombin III levels. Performed at [facility], [place] [address], [place], [place] [address]   Magnesium     Status: Abnormal   Collection Time: 09/03/19  5:00 AM  Result Value Ref Range   Magnesium 1.6 (L) 1.7 - 2.4 mg/dL    Comment: Performed at [facility] [address], [place], [place] [address]    Current Facility-Administered Medications  Medication Dose Route Frequency Provider Last Rate Last Admin  . 0.9 %  sodium chloride infusion  250 mL Intravenous PRN Consuelo Pandy, PA-C 10 mL/hr at 08/30/19 1000 New Bag at 09/03/19 0700  . acetaminophen (TYLENOL) tablet 650 mg  650 mg Oral Q4H PRN Consuelo Pandy, PA-C   650 mg at 09/03/19 1206  . albuterol (PROVENTIL) (2.5 MG/3ML) 0.083% nebulizer solution 2.5 mg  2.5 mg Nebulization Q6H PRN Lyda Jester M, PA-C   2.5 mg at 08/18/19 1519  . alum & mag hydroxide-simeth (MAALOX/MYLANTA) 200-200-20 MG/5ML suspension 30 mL  30 mL Oral Q4H PRN Consuelo Pandy, PA-C   30 mL at 08/29/19 0858  . amiodarone (PACERONE) tablet 200 mg  200 mg Oral BID Lyda Jester M, PA-C   200 mg at 09/03/19 1207  . ascorbic acid (VITAMIN C) tablet 250 mg  250 mg Oral Daily Lyda Jester M, PA-C   250 mg at 09/03/19 1206  . bisacodyl (DULCOLAX) EC tablet 10 mg  10 mg Oral Daily PRN Lyda Jester M, PA-C       Or  . bisacodyl (DULCOLAX) suppository 10 mg  10 mg Rectal Daily PRN Lyda Jester M, PA-C      . bisacodyl (DULCOLAX) EC tablet 10 mg  10 mg Oral Daily Lyda Jester M, PA-C   10 mg at 08/31/19 0824  . ceFAZolin (ANCEF) IVPB 2g/100 mL premix  2 g Intravenous Q8H [place], FNP 200 mL/hr at 09/03/19 0447 2 g at 09/03/19 0447  . Chlorhexidine Gluconate Cloth 2 % PADS 6 each  6 each Topical Daily Consuelo Pandy, PA-C   6 each at 09/02/19 1029  . dextrose 5 %-0.45 % sodium chloride infusion   Intravenous Continuous Consuelo Pandy, PA-C   Stopped at 08/25/19 1414  . docusate sodium (COLACE) capsule 100 mg  100 mg Oral TID Consuelo Pandy, PA-C   100 mg at 09/01/19 2128  . DULoxetine (CYMBALTA) DR capsule 30 mg  30 mg Oral Daily Lyda Jester M, PA-C   30 mg at 09/03/19 1207  . feeding supplement (ENSURE ENLIVE) (ENSURE ENLIVE) liquid  237 mL  237 mL Oral TID BM Lyda Jester M, PA-C   237 mL at 08/31/19 1733  . feeding supplement (PRO-STAT SUGAR FREE 64) liquid 30 mL  30 mL Oral BID Lyda Jester M, PA-C   30 mL at 08/31/19 0820  . ferrous sulfate tablet 325 mg  325 mg Oral Q breakfast Lyda Jester M, PA-C   325 mg at 09/03/19 1208  . furosemide (LASIX) injection 80 mg  80 mg Intravenous BID Lyda Jester M, PA-C   80 mg at 09/03/19 1205  . gabapentin (NEURONTIN) capsule 300 mg  300 mg Oral TID Consuelo Pandy, PA-C   300 mg at 09/03/19 1208  . guaiFENesin-dextromethorphan (ROBITUSSIN DM) 100-10 MG/5ML syrup 5 mL  5 mL Oral Q4H PRN Lyda Jester M, PA-C   5 mL at 08/31/19 2214  . heparin ADULT infusion 100 units/mL (25000 units/278m sodium chloride 0.45%)  500 Units/hr Intravenous Continuous Bensimhon, DShaune Pascal MD      . hydrALAZINE (APRESOLINE) tablet 75 mg  75 mg Oral TID SLyda JesterM, PA-C   75 mg at 09/02/19 2238  . hydrOXYzine (ATARAX/VISTARIL) tablet 25 mg  25 mg Oral Q8H PRN SConsuelo Pandy PA-C   25 mg at 08/31/19 2214  . lactated ringers infusion   Intravenous Continuous SConsuelo Pandy PA-C   New Bag at 08/24/19 1035  . linaclotide (LINZESS) capsule 145 mcg  145 mcg Oral Daily SLyda JesterM, PA-C   145 mcg at 09/02/19 1027  . magnesium hydroxide (MILK OF MAGNESIA) suspension 30 mL   30 mL Oral Daily PRN SLyda JesterM, PA-C      . magnesium sulfate IVPB 4 g 100 mL  4 g Intravenous Once Bensimhon, DShaune Pascal MD      . MEDLINE mouth rinse  15 mL Mouth Rinse BID SLyda JesterM, PA-C   15 mL at 09/02/19 2242  . multivitamin with minerals tablet 1 tablet  1 tablet Oral Daily SConsuelo Pandy PA-C   1 tablet at 09/03/19 1206  . ondansetron (ZOFRAN) injection 4 mg  4 mg Intravenous Q6H PRN SLyda JesterM, PA-C      . oxyCODONE (Oxy IR/ROXICODONE) immediate release tablet 5-10 mg  5-10 mg Oral Q4H PRN SConsuelo Pandy PA-C   10 mg at 09/03/19 0959  . pantoprazole (PROTONIX) EC tablet 40 mg  40 mg Oral BID SLyda JesterM, PA-C   40 mg at 09/03/19 1209  . phenylephrine (NEOSYNEPHRINE) 10-0.9 MG/250ML-% infusion  0-100 mcg/min Intravenous Titrated Simmons, Brittainy M, PA-C      . potassium chloride SA (KLOR-CON) CR tablet 40 mEq  40 mEq Oral Q4H Bensimhon, DShaune Pascal MD   40 mEq at 09/03/19 1205  . rifabutin (MYCOBUTIN) capsule 300 mg  300 mg Oral Daily CG[address]en Circle FNP   300 mg at 09/03/19 1203  . senna-docusate (Senokot-S) tablet 1 tablet  1 tablet Oral QHS SConsuelo Pandy PA-C   1 tablet at 08/30/19 2213  . sildenafil (REVATIO) tablet 20 mg  20 mg Oral TID SConsuelo Pandy PA-C   20 mg at 09/03/19 1207  . sodium chloride flush (NS) 0.9 % injection 10-40 mL  10-40 mL Intracatheter Q12H SLyda JesterM, PA-C   10 mL at 09/01/19 2132  . sodium chloride flush (NS) 0.9 % injection 10-40 mL  10-40 mL Intracatheter PRN SLyda JesterM, PA-C      . sodium chloride flush (NS) 0.9 % injection 3 mL  3 mL Intravenous Q12H Lyda Jester M, PA-C   3 mL at 09/02/19 1636  . sodium chloride flush (NS) 0.9 % injection 3 mL  3 mL Intravenous PRN Lyda Jester M, PA-C      . spironolactone (ALDACTONE) tablet 12.5 mg  12.5 mg Oral Daily Clegg, Amy D, NP      . traMADol (ULTRAM) tablet 100 mg  100 mg Oral Q6H PRN Lyda Jester M,  PA-C   100 mg at 09/02/19 2054  . Warfarin - Pharmacist Dosing Inpatient   Does not apply q1600 Consuelo Pandy, PA-C   Given at 09/02/19 1636  . zinc sulfate capsule 220 mg  220 mg Oral Daily Lyda Jester M, PA-C   220 mg at 09/03/19 1207    Musculoskeletal: Strength & Muscle Tone: decreased Gait & Station: did not witness Patient leans: N/A  Psychiatric Specialty Exam: Physical Exam  Nursing note and vitals reviewed. Constitutional: He is oriented to person, place, and time. He appears well-developed and well-nourished.  HENT:  Head: Normocephalic.  Respiratory: Effort normal.  Musculoskeletal:     Cervical back: Normal range of motion.  Neurological: He is alert and oriented to person, place, and time.  Psychiatric: His speech is normal and behavior is normal. Judgment and thought content normal. His mood appears anxious. His affect is blunt. Cognition and memory are normal.    Review of Systems  Psychiatric/Behavioral: The patient is nervous/anxious.   All other systems reviewed and are negative.   Blood pressure 102/76, pulse (!) 43, temperature 98.9 F (37.2 C), resp. rate (!) 22, height '6\' 2"'$  (1.88 m), weight 96.9 kg, SpO2 100 %.Body mass index is 27.43 kg/m.  General Appearance: Casual  Eye Contact:  Good  Speech:  Normal Rate  Volume:  Normal  Mood:  Anxious  Affect:  Blunt  Thought Process:  Coherent and Descriptions of Associations: Intact  Orientation:  Full (Time, Place, and Person)  Thought Content:  WDL and Logical  Suicidal Thoughts:  No  Homicidal Thoughts:  No  Memory:  Immediate;   Good Recent;   Good Remote;   Good  Judgement:  Fair  Insight:  Fair  Psychomotor Activity:  Decreased  Concentration:  Concentration: Good and Attention Span: Good  Recall:  Good  Fund of Knowledge:  Good  Language:  Good  Akathisia:  No  Handed:  Right  AIMS (if indicated):     Assets:  Leisure Time Resilience Social Support  ADL's:  Impaired   Cognition:  WNL  Sleep:       Treatment Plan Summary: General anxiety d/o: -Increase Cymbalta 30 mg daily to 30 mg BID -Continue gabapentin 300 mg TID--assuring the last dose is in the evening -Avoid benzodiazepines d/t past alcohol abuse  Insomnia: -Promote sleep hygiene  -Offer hydroxyzine 25 mg that is ordered PRN for anxiety already at bedtime for sleep  Disposition: No evidence of imminent risk to self or others at present.   Patient does not meet criteria for psychiatric inpatient admission.  Waylan Boga, NP 09/03/2019 12:34 PM

## 2019-09-03 NOTE — Anesthesia Procedure Notes (Signed)
Procedure Name: Intubation Date/Time: 09/03/2019 7:42 AM Performed by: Lovie Chol, CRNA Pre-anesthesia Checklist: Patient identified, Emergency Drugs available, Suction available and Patient being monitored Patient Re-evaluated:Patient Re-evaluated prior to induction Oxygen Delivery Method: Circle System Utilized Preoxygenation: Pre-oxygenation with 100% oxygen Induction Type: IV induction Ventilation: Mask ventilation without difficulty and Two handed mask ventilation required Laryngoscope Size: Glidescope and 4 Grade View: Grade I Tube type: Oral Tube size: 7.5 mm Number of attempts: 1 Airway Equipment and Method: Stylet and Oral airway Placement Confirmation: ETT inserted through vocal cords under direct vision,  positive ETCO2 and breath sounds checked- equal and bilateral Secured at: 21 cm Tube secured with: Tape Dental Injury: Teeth and Oropharynx as per pre-operative assessment  Difficulty Due To: Difficulty was anticipated and Difficult Airway- due to limited oral opening

## 2019-09-03 NOTE — Transfer of Care (Signed)
Immediate Anesthesia Transfer of Care Note  Patient: Jonathan Wood  Procedure(s) Performed: WOUND VAC CHANGE (N/A )  Patient Location: PACU  Anesthesia Type:General  Level of Consciousness: awake, oriented and patient cooperative  Airway & Oxygen Therapy: Patient Spontanous Breathing and Patient connected to nasal cannula oxygen  Post-op Assessment: Report given to RN and Post -op Vital signs reviewed and stable  Post vital signs: Reviewed  Last Vitals:  Vitals Value Taken Time  BP    Temp    Pulse 94 09/03/19 0841  Resp 21 09/03/19 0841  SpO2 100 % 09/03/19 0841  Vitals shown include unvalidated device data.  Last Pain:  Vitals:   09/03/19 0457  TempSrc: Oral  PainSc:       Patients Stated Pain Goal: 2 (09/02/19 1346)  Complications: No apparent anesthesia complications

## 2019-09-03 NOTE — Progress Notes (Addendum)
    [facility]   Reason for visit: Follow up on MSSA bacteremia  Interval History: s/p operative debridement and VAC placement.  Afebrile, WBC 14.8.  No acute events.     Physical Exam: Constitutional:  Vitals:   09/03/19 0900 09/03/19 1015  BP: 102/76 105/67  Pulse: (!) 43 (!) 59  Resp: (!) 22 (!) 22  Temp: 98.9 F (37.2 C) 98.3 F (36.8 C)  SpO2: 100% 98%   patient appears in NAD Respiratory: Normal respiratory effort; CTA B Cardiovascular: RRR GI: soft, nt, nd  Review of Systems: Constitutional: negative for fevers and chills Gastrointestinal: negative for diarrhea  Lab Results  Component Value Date   WBC 14.8 (H) 09/03/2019   HGB 8.7 (L) 09/03/2019   HCT 28.7 (L) 09/03/2019   MCV 96.0 09/03/2019   PLT 248 09/03/2019    Lab Results  Component Value Date   CREATININE 1.26 (H) 09/03/2019   BUN 39 (H) 09/03/2019   NA 132 (L) 09/03/2019   K 3.6 09/03/2019   CL 88 (L) 09/03/2019   CO2 31 09/03/2019    Lab Results  Component Value Date   ALT 13 08/29/2019   AST 33 08/29/2019   ALKPHOS 123 08/29/2019     Microbiology: Recent Results (from the past 240 hour(s))  Culture, blood (routine x 2)     Status: None   Collection Time: 08/28/19 10:15 AM   Specimen: BLOOD  Result Value Ref Range Status   Specimen Description BLOOD LEFT ANTECUBITAL  Final   Special Requests   Final    BOTTLES DRAWN AEROBIC AND ANAEROBIC Blood Culture adequate volume   Culture   Final    NO GROWTH 5 DAYS Performed at [facility], [address], [place], [place] [address]    Report Status 09/02/2019 FINAL  Final  Culture, blood (routine x 2)     Status: None   Collection Time: 08/28/19 10:24 AM   Specimen: BLOOD LEFT HAND  Result Value Ref Range Status   Specimen Description BLOOD LEFT HAND  Final   Special Requests   Final    BOTTLES DRAWN AEROBIC AND ANAEROBIC Blood Culture adequate volume   Culture   Final    NO GROWTH 5 DAYS Performed at  [facility], [address], [place], [place] [address]    Report Status 09/02/2019 FINAL  Final  Body fluid culture     Status: None   Collection Time: 08/28/19 12:00 PM   Specimen: Body Fluid  Result Value Ref Range Status   Specimen Description FLUID  Final   Special Requests ABDOMEN  Final   Gram Stain   Final    FEW WBC PRESENT, PREDOMINANTLY PMN NO ORGANISMS SEEN    Culture   Final    NO GROWTH 3 DAYS Performed at [facility], [address], [place], [place] [address]    Report Status 08/31/2019 FINAL  Final    Impression/Plan:  1. MSSA bacteremia - s/p operative debridement again today.  I recommend continuing treatment for 6 weeks from today through June 30th, then conversion to oral suppressive therapy with Keflex 500 mg 4 times per day.   On rifabutin as well due to hardware and can stop this after the 6 weeks.    2.  Fever - resolved.    3. Leukocytosis - stable and down.    I will sign off, call with questions.

## 2019-09-03 NOTE — Anesthesia Postprocedure Evaluation (Signed)
Anesthesia Post Note  Patient: Jonathan Wood  Procedure(s) Performed: WOUND VAC CHANGE (N/A )     Patient location during evaluation: PACU Anesthesia Type: General Level of consciousness: awake and alert Pain management: pain level controlled Vital Signs Assessment: post-procedure vital signs reviewed and stable Respiratory status: spontaneous breathing, nonlabored ventilation, respiratory function stable and patient connected to nasal cannula oxygen Cardiovascular status: blood pressure returned to baseline and stable Postop Assessment: no apparent nausea or vomiting Anesthetic complications: no    Last Vitals:  Vitals:   09/03/19 0840 09/03/19 0900  BP: (!) 104/91 102/76  Pulse: 95 (!) 43  Resp: 20 (!) 22  Temp:  37.2 C  SpO2: 100% 100%    Last Pain:  Vitals:   09/03/19 0900  TempSrc:   PainSc: 0-No pain                 Iziah Cates,W. EDMOND

## 2019-09-03 NOTE — Progress Notes (Signed)
ANTICOAGULATION CONSULT NOTE - Follow Up Consult  Pharmacy Consult for warfarin (hold) + heparin Indication: LVAD  No Known Allergies  Patient Measurements: Height: 6\' 2"  (188 cm) Weight: 96.9 kg (213 lb 10 oz) IBW/kg (Calculated) : 82.2  Vital Signs: Temp: 98.9 F (37.2 C) (04/19 0900) Temp Source: Oral (04/19 0457) BP: 102/76 (04/19 0900) Pulse Rate: 43 (04/19 0900)  Labs: Recent Labs    09/01/19 0415 09/01/19 0415 09/02/19 0447 09/02/19 0532 09/02/19 1643 09/03/19 0500  HGB 7.8*   < > 8.0*  --   --  8.7*  HCT 26.3*  --  26.7*  --   --  28.7*  PLT 223  --  230  --   --  248  LABPROT 23.1*  --  16.8*  --   --  17.6*  INR 2.1*  --  1.4*  --   --  1.5*  HEPARINUNFRC  --   --   --   --  <0.10* <0.10*  CREATININE 1.45*   < >  --  1.37* 1.35* 1.26*   < > = values in this interval not displayed.    Estimated Creatinine Clearance: 85.2 mL/min (A) (by C-G formula based on SCr of 1.26 mg/dL (H)).   Medical History: Past Medical History:  Diagnosis Date  . Asthma   . CHF (congestive heart failure) (HCC)    a. 09/2016: EF 20-25% with cath showing normal cors  . GERD (gastroesophageal reflux disease)   . History of hiatal hernia   . OSA on CPAP 09/06/2018   Severe OSA with AHI 68/hr on CPAP at 12cm H2O    Assessment: 48yom with HF s/p LVAD implant HM3 place 4/19 admitted with drive line infection. S/p OR on 4/15 for repeat I&D with application of wound vac.   Warfarin PTA 7.5mg  MW and 5mg  all other days admit INR elevated at 5 - probably acute infection related supra-sensitive.   INR1.5, likely due to combination of holding late last week and rifabutin addition. Discussed with PVT, ok to start low-dose heparin and keep INR 1.8-2.2 with OR planned for tomorrow.  Heparin level undetectable this evening as expected. No rate change indicated.   Goal of Therapy:  INR 2-2.5 Monitor platelets by anticoagulation protocol: Yes   Plan:  -Resume heparin 500 units/hr at 1  pm -Hold Coumadin in anticipation of OR on Wednesday -Daily INR, CBC, heparin level  , Monday, [facility] Clinical Pharmacist Phone [phone]  09/03/2019 10:13 AM

## 2019-09-03 NOTE — Progress Notes (Addendum)
Patient ID: Jonathan Wood, male   DOB: Feb 17, 1971, 49 y.o.   MRN: [id number]     Advanced Heart Failure Rounding Note  PCP-Cardiologist: [name], MD    Patient Profile   49 y.o. 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  Subjective:    - 4/5 S/p I&D w/ excisional debridement of subxiphoid abscess. Cultures + Staph Aureus  - 4/7, Returned to OR for debridement, Irrigation and Packing of Abdominal Incision - 4/9 S/P I&D abd wound with Acell and VAC application.  - 4/15 s/p I&D wound with VAC application.  - 4/19 S/P I&D with VAC application.   Blood cultures positive for MSSA.  He continues cefazolin, has PICC in place.  WBC trending down, 24>>19>>13>>13> 12.4->14.8 remains afebrile.  Yesterday diuresed with IV lasix + metolazone. CVP down to 12-13.   Co-ox 61%  Creatinine 1.4 => 1.45 -> 1.37->1.26   Complaining abdominal pain. Denies SOB.   HM3 VAD Interrogation: IRCVE:[id number] Flow:5.2 Power:4.2  PI:2.9   No PI events.   Objective:   Weight Range: 96.9 kg Body mass index is 27.43 kg/m.   Vital Signs:   Temp:  [98 F (36.7 C)-98.9 F (37.2 C)] 98.9 F (37.2 C) (04/19 0900) Pulse Rate:  [43-99] 43 (04/19 0900) Resp:  [20-28] 22 (04/19 0900) BP: (85-104)/(62-91) 102/76 (04/19 0900) SpO2:  [98 %-100 %] 100 % (04/19 0900) Weight:  [96.9 kg] 96.9 kg (04/19 0500) Last BM Date: 09/02/19  Weight change: Filed Weights   09/01/19 0427 09/02/19 0359 09/03/19 0500  Weight: 99.5 kg 98.3 kg 96.9 kg    Intake/Output:   Intake/Output Summary (Last 24 hours) at 09/03/2019 1031 Last data filed at 09/03/2019 0827 Gross per 24 hour  Intake 265 ml  Output 2303 ml  Net -2038 ml    Telemetry: NSR 80-90s (personally reviewed)   Physical Exam   CVP 13 Physical Exam: GENERAL: In bed. No distress.  HEENT:  normal  NECK: Supple, JVP 11-12  Carotids .  2+ bilaterally, no bruits.  No lymphadenopathy or thyromegaly appreciated.   CARDIAC:  Mechanical heart sounds with LVAD hum present.  LUNGS:  Clear to auscultation bilaterally.  ABDOMEN:  Sluggish bowel sounds. Abdominal dressing intact  LVAD exit site. VAC dressing intact.  No erythema or drainage.  Stabilization device present and accurately applied.   EXTREMITIES:  Warm and dry, no cyanosis, clubbing, rash or edema  RUE PICC  NEUROLOGIC:  Alert and oriented x 3.    No aphasia.  No dysarthria.  Affect pleasant.     EKG : SR 90s   Labs    CBC Recent Labs    09/02/19 0447 09/03/19 0500  WBC 12.4* 14.8*  NEUTROABS 8.7* 10.3*  HGB 8.0* 8.7*  HCT 26.7* 28.7*  MCV 95.4 96.0  PLT 230 017   Basic Metabolic Panel Recent Labs    09/01/19 0415 09/02/19 0532 09/02/19 1643 09/03/19 0500  NA 132*   < > 132* 132*  K 2.9*   < > 3.1* 3.6  CL 90*   < > 88* 88*  CO2 29   < > 32 31  GLUCOSE 207*   < > 130* 124*  BUN 44*   < > 41* 39*  CREATININE 1.45*   < > 1.35* 1.26*  CALCIUM 8.5*   < > 9.0 9.1  MG 2.0  --   --  1.6*   < > = values in this interval not displayed.   Liver Function Tests No results for input(s): AST, ALT, ALKPHOS, BILITOT, PROT, ALBUMIN in the last 72 hours. No results for input(s): LIPASE, AMYLASE in the last 72 hours. Cardiac Enzymes No results for input(s): CKTOTAL, CKMB, CKMBINDEX, TROPONINI in the last 72 hours.  BNP: BNP (last 3 results) Recent Labs    03/21/19 1415 04/23/19 1350  BNP 426.9* 709.6*    ProBNP (last 3 results) No results for input(s): PROBNP in the last 8760 hours.   D-Dimer No results for input(s): DDIMER in the last 72 hours. Hemoglobin A1C No results for input(s): HGBA1C in the last 72 hours. Fasting Lipid Panel No results for input(s): CHOL, HDL, LDLCALC, TRIG, CHOLHDL, LDLDIRECT in the last 72 hours. Thyroid Function Tests No results for input(s): TSH, T4TOTAL, T3FREE,  THYROIDAB in the last 72 hours.  Invalid input(s): FREET3  Other results:   Imaging    No results found.   Medications:     Scheduled Medications: . amiodarone  200 mg Oral BID  . vitamin C  250 mg Oral Daily  . bisacodyl  10 mg Oral Daily  . Chlorhexidine Gluconate Cloth  6 each Topical Daily  . docusate sodium  100 mg Oral TID  . DULoxetine  30 mg Oral Daily  . feeding supplement (ENSURE ENLIVE)  237 mL Oral TID BM  . feeding supplement (PRO-STAT SUGAR FREE 64)  30 mL Oral BID  . ferrous sulfate  325 mg Oral Q breakfast  . furosemide  80 mg Intravenous BID  . gabapentin  300 mg Oral TID  . hydrALAZINE  75 mg Oral TID  . linaclotide  145 mcg Oral Daily  . mouth rinse  15 mL Mouth Rinse BID  . multivitamin with minerals  1 tablet Oral Daily  . pantoprazole  40 mg Oral BID  . potassium chloride  40 mEq Oral Q4H  . rifabutin  300 mg Oral Daily  . senna-docusate  1 tablet Oral QHS  . sildenafil  20 mg Oral TID  . sodium chloride flush  10-40 mL Intracatheter Q12H  . sodium chloride flush  3 mL Intravenous Q12H  . Warfarin - Pharmacist Dosing Inpatient   Does not apply q1600  . zinc sulfate  220 mg Oral Daily    Infusions: . sodium chloride 10 mL/hr at 08/30/19 1000  .  ceFAZolin (ANCEF) IV 2 g (09/03/19 0447)  . dextrose 5 % and 0.45% NaCl Stopped (08/25/19 1414)  . heparin    . lactated ringers    . magnesium sulfate bolus IVPB    . phenylephrine (NEO-SYNEPHRINE) Adult infusion      PRN Medications: sodium chloride, acetaminophen, albuterol, alum & mag hydroxide-simeth, bisacodyl **OR** bisacodyl, guaiFENesin-dextromethorphan, hydrOXYzine, magnesium hydroxide, ondansetron (ZOFRAN) IV, oxyCODONE, sodium chloride flush, sodium chloride flush, traMADol    Assessment/Plan    1. Fever & leukocytosis -> MSSA bacteremia/ Subxiphoid Abscess  - bcx 2/2 MSSA - ID has changed antibiotics to cefazolin, now has PICC.   - WBC trending down.  Now 12.4.  Afebrile. - He  is not a candidate for VAD exchange due to severe RV failure and noncompliance.  - S/p I&D on 4/5. Wound cultures +MSSA.  - Back to OR 4/7, 4/9, 4/15 , 4/19 debridement and irrigation of driveline + packing, wound vac in place.    - ID recommends 6 weeks of IV cefazolin followed by long-term suppressive oral cephalexin. He has PICC  line. -Reconstructive surgery set up for later this week.   - INR is 1.5  Today. - Discuss with Dr Maren Beach when to start heparin.   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  S/p HM-3 LVAD on 09/06/17 - On sildenafil for RV failure.  - CO-OX stable.  - CVP 12-13. Volume status improving. Continue IV lasix.   - Add 12.5 mg spiro daily - Continue current dose of hydralazine and sildenafil. No imdur with sildenafil.  -Renal function stable.   3. HM3 LVAD 09/06/2017. - VAD interrogated personally. Parameters stable. - LDH 175 today  - INR 1.4 on warfarin.  Management as described above. - Off ASA with PUD.    4. Essential HTN - MAP 70s-80s  - Continue hydralazine 75 mg tid. - Continue sildenafil 20 mg tid     5. AKI - Creatinine peaked at 1.4. Todays creatinine 1.26  6. OSA - sleep study with very severe OSA (AHI 69/hr) - non-compliant Bipap in the past, has refused to use.  - He continues to tell respiratory that he will wear the machine himself.   -He is only using intermittently.   7. H/o GI bleed  - 08/2018 EGD completed and showed gastritis and nonbleeding duodenal ulcers, colonoscopy with 5 polyps removed. Protonix was increased to BID.  - No recent GI bleeding - hgb 8.7 today, relatively stable.  No overt bleeding.  - Transfuse hgb < 7.  - He will stay off aspirin for now.   8. Tobacco Abuse  - says he quit recently  9. Severe protein calorie malnutrition  - Albumin 2.1 on 4/14 - nutrition seeing   10. Constipation  - Abd plain film 4/12 unremarkable  - suspect related to pain meds -  also h/o IBS  - Stable today.  11. Hypokalemia -K 3.6  - Supp K   12. Hypo magnesium Mag low.  Give 4 grams mag.   Length of Stay: 18  Amy Clegg, NP  09/03/2019, 10:31 AM  Advanced Heart Failure Team Pager [phone] (M-F; 7a - 4p)  Please contact [facility] for night-coverage after hours (4p -7a ) and weekends on [email or web address] 10:31 AM   Patient seen and examined with Tonye Becket, NP. We discussed all aspects of the encounter. I agree with the assessment and plan as stated above.   He was back in the OR this am for repeat debridement and irrigation of driveline + packing and change of wound vac. Feels sore. C/o ab pain, Diuresing well with IV lasix. Creatinine stable. Co-ox 61%. Remains depressed and anxious.   General:  Lying in bed. NAD HEENT: normal  Neck: supple. JVP to jaw Carotids 2+ bilat; no bruits. No lymphadenopathy or thryomegaly appreciated. Cor: LVAD hum.  Lungs: Clear. Abdomen: obese soft, mildly tender, non-distended. Dressing in place. Driveline site clean. Anchor in place.  Extremities: no cyanosis, clubbing, rash. Warm no edema  Neuro: alert & oriented x 3. No focal deficits. Moves all 4 without problem   He is s/p repeat debridement today. Remains on IV abx. Afebrile. D/w Dr. Ileene Musa. Will likely need pec flap later this week and possible re-location of DL to other side. Will keep of of warfarin. Continue low-dose heparin. Volume status improving with IV lasix. Continue at least one more day. Supp K and mag. VAD interrogated personally. Parameters stable. Appresicat Psychiatry input.   Arvilla Meres, MD  8:17 PM

## 2019-09-03 NOTE — Brief Op Note (Signed)
09/03/2019  8:44 AM  PATIENT:  Jonathan Wood  49 y.o. male  PRE-OPERATIVE DIAGNOSIS:  VAD WOUND INFECTION  POST-OPERATIVE DIAGNOSIS:  VAD WOUND INFECTION  PROCEDURE:  Procedure(s): WOUND VAC CHANGE (N/A)  Irrigation of Abdominal Wound Application of A-Cell  SURGEON:  Surgeon(s) and Role:    Kerin Perna, MD - Primary  PHYSICIAN ASSISTANT: na ASSISTANTS: none   ANESTHESIA:   general  EBL:  3 mL   BLOOD ADMINISTERED:none  DRAINS: none   LOCAL MEDICATIONS USED:  NONE  SPECIMEN:  No Specimen  DISPOSITION OF SPECIMEN:  N/A  COUNTS:  YES  TOURNIQUET:  * No tourniquets in log *  DICTATION: .Dragon Dictation  PLAN OF CARE: return to 2 C  PATIENT DISPOSITION:  PACU - hemodynamically stable.   Delay start of Pharmacological VTE agent (>24hrs) due to surgical blood loss or risk of bleeding: yes  Hold coumadin for muscle flap reconstruction this week

## 2019-09-03 NOTE — Progress Notes (Signed)
CSW met with patient at bedside. Patient requested assistance with obtaining excused letter for court date scheduled for today due to hospitalization. CSW informed patient and mother at bedside that court was notified of patient's hospitalization and inability to attend court today. Patient was instructed to return call to his lawyer for updated court appearance. Patient and mother verbalize understanding of follow up. CSW continues to follow for supportive needs as identified. [name], [place], [place]

## 2019-09-03 NOTE — Anesthesia Postprocedure Evaluation (Signed)
Anesthesia Post Note  Patient: Jonathan Wood  Procedure(s) Performed: WOUND VAC CHANGE (N/A )     Patient location during evaluation: PACU Anesthesia Type: General Level of consciousness: awake and alert Pain management: pain level controlled Vital Signs Assessment: post-procedure vital signs reviewed and stable Respiratory status: spontaneous breathing, nonlabored ventilation, respiratory function stable and patient connected to nasal cannula oxygen Cardiovascular status: blood pressure returned to baseline and stable Postop Assessment: no apparent nausea or vomiting Anesthetic complications: no    Last Vitals:  Vitals:   09/03/19 0840 09/03/19 0900  BP: (!) 104/91 102/76  Pulse: 95 (!) 43  Resp: 20 (!) 22  Temp:  37.2 C  SpO2: 100% 100%    Last Pain:  Vitals:   09/03/19 0900  TempSrc:   PainSc: 0-No pain                 [name],W. EDMOND     

## 2019-09-03 NOTE — Progress Notes (Signed)
Pre Procedure note for inpatients:   Jonathan Wood has been scheduled for Procedure(s): WOUND VAC CHANGE (N/A) today. The various methods of treatment have been discussed with the patient. After consideration of the risks, benefits and treatment options the patient has consented to the planned procedure.   The patient has been seen and labs reviewed. There are no changes in the patient's condition to prevent proceeding with the planned procedure today.  Recent labs:  Lab Results  Component Value Date   WBC 12.4 (H) 09/02/2019   HGB 8.0 (L) 09/02/2019   HCT 26.7 (L) 09/02/2019   PLT 230 09/02/2019   GLUCOSE 124 (H) 09/03/2019   CHOL 111 08/30/2017   TRIG 38 08/30/2017   HDL 33 (L) 08/30/2017   LDLCALC 70 08/30/2017   ALT 13 08/29/2019   AST 33 08/29/2019   NA 132 (L) 09/03/2019   K 3.6 09/03/2019   CL 88 (L) 09/03/2019   CREATININE 1.26 (H) 09/03/2019   BUN 39 (H) 09/03/2019   CO2 31 09/03/2019   TSH 0.209 (L) 08/13/2019   INR 1.4 (H) 09/02/2019   HGBA1C 5.2 08/30/2017    Mikey Bussing, MD 09/03/2019 6:54 AM

## 2019-09-03 NOTE — Progress Notes (Signed)
VAD Coordinator Procedure Note:   VAD Coordinator met patient in holding room. Pt undergoing wound vac change per Dr. Darcey Nora. Hemodynamics and VAD parameters monitored by myself and anesthesia throughout the procedure. Blood pressures were obtained with automatic cuff on left arm and correlated with Doppler MAP.    Time: Doppler Auto  BP Flow PI Power Speed  Pre-procedure:           07:10 70 89/78 (69) 5.2 2.4 4.1 5600   07:30  86/74 (70) 5.2 2.3 4.0   Secation Induction:          07:45  98/70 (74) 5.1 2.4 3.9    08:00  93/72 (78) 4.9 2.2 4.1    08:15  92/63 (74) 4.9 2.2 4.1    08:30  90/69 (77) 4.9 2.2 4.1            Recovery Area:          08:40  104/91 (96) 5.0 3.4 4.2    09:00  100/74 (84) 4.9 3.7 4.2    09:15  102/76 (86) 5.0 3.4 4.2              Patient tolerated the procedure well. VAD Coordinator accompanied and remained with patient in recovery area.    Patient Disposition: [place] RN, [place] Coordinator [phone]

## 2019-09-03 NOTE — Progress Notes (Signed)
Day of Surgery Procedure(s) (LRB): WOUND VAC CHANGE (N/A) Subjective: Upper abdominal wound appears very clean now after debridements wound VAC therapy and IV antibiotics. Last wound culture no growth-being treated with MSSA infection with Ancef. I personally change the wound VAC and examined the wound today with Dr. Ulice Bold.  Consideration is being made for muscle flap coverage of the exposed portion of the HeartMate 3 and closure of the upper abdominal wound. Coumadin will be stopped and patient will be on heparin bridge for surgery. Surgery tentatively scheduled for Wednesday afternoon.  I have discussed this plan with both the patient and his mother. Objective: Vital signs in last 24 hours: Temp:  [98 F (36.7 C)-98.9 F (37.2 C)] 98.9 F (37.2 C) (04/19 0900) Pulse Rate:  [43-99] 43 (04/19 0900) Cardiac Rhythm: Normal sinus rhythm (04/19 0938) Resp:  [20-28] 22 (04/19 0900) BP: (85-104)/(62-91) 102/76 (04/19 0900) SpO2:  [98 %-100 %] 100 % (04/19 0900) Weight:  [96.9 kg] 96.9 kg (04/19 0500)  Hemodynamic parameters for last 24 hours: CVP:  [13 mmHg-18 mmHg] 14 mmHg  Intake/Output from previous day: 04/18 0701 - 04/19 0700 In: 65 [I.V.:65] Out: 2700 [Urine:2650; Drains:50] Intake/Output this shift: Total I/O In: 200 [I.V.:200] Out: 3 [Blood:3]  Exam Upper abdominal wound very clean Abdomen less distended Extremities warm Normal VAD hum  Lab Results: Recent Labs    09/02/19 0447 09/03/19 0500  WBC 12.4* 14.8*  HGB 8.0* 8.7*  HCT 26.7* 28.7*  PLT 230 248   BMET:  Recent Labs    09/02/19 1643 09/03/19 0500  NA 132* 132*  K 3.1* 3.6  CL 88* 88*  CO2 32 31  GLUCOSE 130* 124*  BUN 41* 39*  CREATININE 1.35* 1.26*  CALCIUM 9.0 9.1    PT/INR:  Recent Labs    09/03/19 0500  LABPROT 17.6*  INR 1.5*   ABG    Component Value Date/Time   PHART 7.405 09/08/2017 0049   HCO3 23.4 09/08/2017 0049   TCO2 32 09/12/2017 1605   ACIDBASEDEF 1.0 09/08/2017  0049   O2SAT 61.2 09/03/2019 0500   CBG (last 3)  No results for input(s): GLUCAP in the last 72 hours.  Assessment/Plan: S/P HeartMate 3 with driveline tunnel infection  Plan wound VAC therapy and IV antibiotics until muscle flap with wound closure later this week. Heparin bridge until after surgery  LOS: 18 days    Jonathan Wood 09/03/2019

## 2019-09-03 NOTE — Progress Notes (Addendum)
LVAD Coordinator Rounding Note:  Admitted 08/16/19 by Dr. [name] due to VAD drive line infection/pneumonia/sepsis  HM III LVAD implanted on 09/06/17 by Dr. Cyndia Bent under Destination Therapy criteria.  Dr. Darcey Nora to patient's bedside after returning to room after surgery this am. Mother at bedside, Dr. Darcey Nora updated both.  Pt/mother asked that a letter be sent to [facility] letting them know patient is currently admitted to hospital. Reached out to [facility] and updated about patient's current hospitalization. He advises that patient needs to contact his attorney to have date rescheduled. [name], LCSW updated patient/mother re: same.   Vital signs: Temp: 98 HR: 96 Doppler Pressure: 70 Automatic BP: 93/72 (78) O2 Sat: 100% on 2L Wt: 227.5>231>235>233.9>236.3.....216>220>219>216>213 lbs   LVAD interrogation reveals:  Speed: 5600 Flow: 4.9 Power:  4.1w PI: 2.2 Alarms: none Events: none Hematocrit: 29  Fixed speed: 5600 Low speed limit: 5300  Drive Line:  Existing VAD dressing removed and site care performed using sterile technique. Drive line exit site cleaned with Chlora prep applicators x 2, allowed to dry, and Sorbaview dressing with bio patch re-applied. Exit site healed and incorporated, the velour is fully implanted at exit site. No redness, tenderness, drainage, foul odor or rash noted. Drive line anchor re-applied. Pt has weekly dressing changes; BS nurse may perform.   Sternal wound: To be changed this am in OR per Dr. Darcey Nora  Labs:  LDH trend: 227>230>271........208>187>161>175>168  INR trend: 4.5>5.0>1.7>1.7>1.7>1.8>1.7>2.1>3.4>2.5>2.7>2.1>1.4>1.5  WBC: 27.3>26.2>24>34.6>25.8>20.8......13.0>12.4>14.8  Alk Phos: 138> 177>123  AST: 53> 154>33  ALT: 17> 35>13  Infection: - blood cultures 08/16/19>>staphylococcus aureus - blood cultures 08/18/19>> NGTD - Drive line tunnel / sternum 08/20/19>> abundant WBC, gram +  cocci, culture- few staph aureus - Drive line AFB 2/8/118>> pending - Drive line/Sternal Fungus culture 08/20/19>> negative - blood cultures 09/02/19>>negative/fina. - body fluid culture 08/28/19>> negative/final  Anticoagulation Plan: -INR Goal: 2.0 - 2.5  -ASA Dose: none  Adverse Events: - driveline cx 86/7/73>> staph aureus - Admitted for sternal/drive line infection. OR for debridement 08/20/19- + staph aureus wound cultures; further irrigation/debridements done 08/22/19, 08/24/19, 08/30/19   Plan/Recommendations:  1. Call VAD pager if any VAD equipment or drive line issues. 2. Weekly dressing changes by bedside nurse.  3. Planned muscle flap per Dr. Darcey Nora and Dr. Oletha Blend 08/05/19 at 1300 this week.  Zada Girt RN [place] Coordinator  Office: [phone]  24/7 Pager: [phone]

## 2019-09-04 ENCOUNTER — Encounter: Payer: Self-pay | Admitting: *Deleted

## 2019-09-04 LAB — PROTIME-INR
INR: 1.4 — ABNORMAL HIGH (ref 0.8–1.2)
Prothrombin Time: 17.2 seconds — ABNORMAL HIGH (ref 11.4–15.2)

## 2019-09-04 LAB — CBC
HCT: 26.6 % — ABNORMAL LOW (ref 39.0–52.0)
Hemoglobin: 8.1 g/dL — ABNORMAL LOW (ref 13.0–17.0)
MCH: 29 pg (ref 26.0–34.0)
MCHC: 30.5 g/dL (ref 30.0–36.0)
MCV: 95.3 fL (ref 80.0–100.0)
Platelets: 250 10*3/uL (ref 150–400)
RBC: 2.79 MIL/uL — ABNORMAL LOW (ref 4.22–5.81)
RDW: 18 % — ABNORMAL HIGH (ref 11.5–15.5)
WBC: 19.3 10*3/uL — ABNORMAL HIGH (ref 4.0–10.5)
nRBC: 0.1 % (ref 0.0–0.2)

## 2019-09-04 LAB — PREPARE RBC (CROSSMATCH)

## 2019-09-04 LAB — URINALYSIS, ROUTINE W REFLEX MICROSCOPIC
Bilirubin Urine: NEGATIVE
Glucose, UA: NEGATIVE mg/dL
Hgb urine dipstick: NEGATIVE
Ketones, ur: NEGATIVE mg/dL
Leukocytes,Ua: NEGATIVE
Nitrite: NEGATIVE
Protein, ur: NEGATIVE mg/dL
Specific Gravity, Urine: 1.016 (ref 1.005–1.030)
pH: 5 (ref 5.0–8.0)

## 2019-09-04 LAB — BASIC METABOLIC PANEL
Anion gap: 15 (ref 5–15)
BUN: 47 mg/dL — ABNORMAL HIGH (ref 6–20)
CO2: 28 mmol/L (ref 22–32)
Calcium: 8.7 mg/dL — ABNORMAL LOW (ref 8.9–10.3)
Chloride: 85 mmol/L — ABNORMAL LOW (ref 98–111)
Creatinine, Ser: 1.81 mg/dL — ABNORMAL HIGH (ref 0.61–1.24)
GFR calc Af Amer: 51 mL/min — ABNORMAL LOW (ref >60)
GFR calc non Af Amer: 44 mL/min — ABNORMAL LOW (ref >60)
Glucose, Bld: 251 mg/dL — ABNORMAL HIGH (ref 70–99)
Potassium: 3.8 mmol/L (ref 3.5–5.1)
Sodium: 128 mmol/L — ABNORMAL LOW (ref 135–145)

## 2019-09-04 LAB — COOXEMETRY PANEL
Carboxyhemoglobin: 2.9 % — ABNORMAL HIGH (ref 0.5–1.5)
Methemoglobin: 1.1 % (ref 0.0–1.5)
O2 Saturation: 67 %
Total hemoglobin: 8.7 g/dL — ABNORMAL LOW (ref 12.0–16.0)

## 2019-09-04 LAB — LACTATE DEHYDROGENASE: LDH: 172 U/L (ref 98–192)

## 2019-09-04 LAB — HEPARIN LEVEL (UNFRACTIONATED): Heparin Unfractionated: <0.1 IU/mL — ABNORMAL LOW (ref 0.30–0.70)

## 2019-09-04 MED ORDER — CHLORHEXIDINE GLUCONATE CLOTH 2 % EX PADS
6.0000 | MEDICATED_PAD | Freq: Once | CUTANEOUS | Status: AC
  Administered 2019-09-05: 05:00:00 6 via TOPICAL

## 2019-09-04 MED ORDER — SODIUM CHLORIDE 0.9 % IV BOLUS
500.0000 mL | Freq: Once | INTRAVENOUS | Status: AC
  Administered 2019-09-04: 500 mL via INTRAVENOUS

## 2019-09-04 MED ORDER — CHLORHEXIDINE GLUCONATE CLOTH 2 % EX PADS: 6.0000 | MEDICATED_PAD | Freq: Once | CUTANEOUS | Status: AC

## 2019-09-04 MED ORDER — SODIUM CHLORIDE 0.9 % IV SOLN
2.0000 g | Freq: Two times a day (BID) | INTRAVENOUS | Status: DC
  Administered 2019-09-04 – 2019-09-08 (×9): 2 g via INTRAVENOUS
  Filled 2019-09-04 (×13): qty 2

## 2019-09-04 MED ORDER — ACETAMINOPHEN 500 MG PO TABS
1000.0000 mg | ORAL_TABLET | Freq: Once | ORAL | Status: AC
  Administered 2019-09-04: 1000 mg via ORAL
  Filled 2019-09-04: qty 2

## 2019-09-04 MED ORDER — SODIUM CHLORIDE 0.9 % IV SOLN: 1.0000 g | Freq: Two times a day (BID) | INTRAVENOUS | Status: DC

## 2019-09-04 MED ORDER — CEFAZOLIN SODIUM-DEXTROSE 2-4 GM/100ML-% IV SOLN: 2.0000 g | INTRAVENOUS | Status: DC

## 2019-09-04 NOTE — Procedures (Signed)
Patient was offered CPAP but he declined for the night.  Instructed patient to call if he changes his mind.

## 2019-09-04 NOTE — Plan of Care (Signed)

## 2019-09-04 NOTE — TOC Progression Note (Signed)
Transition of Care [facility]) - Progression Note    Patient Details  Name: Jonathan Wood MRN: [id number] Date of Birth: 06-Apr-1971  Transition of [facility]) CM/SW Contact  Leone Haven, RN Phone Number: 09/04/2019, 3:31 PM  Clinical Narrative:    Per French Ana with [employer] ,states that they have the wound vac ready to be delivered here to the hospital when patient is ready to be discharged if he still needs it.          Expected Discharge Plan and Services                                                 Social Determinants of Health (SDOH) Interventions    Readmission Risk Interventions No flowsheet data found.

## 2019-09-04 NOTE — Progress Notes (Signed)
Diagnosis: Bacteremia  Culture Result: MSSA  No Known Allergies  OPAT Orders Discharge antibiotics to be given via PICC line Discharge antibiotics: Per pharmacy protocol cefazolin Duration: 6 weeks End Date: 10/14/19  [facility] Care Per Protocol:  Home health RN for IV administration and teaching; PICC line care and labs.    Labs weekly while on IV antibiotics: _x_ CBC with differential __ BMP _x_ CMP __ CRP __ ESR __ Vancomycin trough __ CK  __ Please pull PIC at completion of IV antibiotics _x_ Please leave PIC in place until doctor has seen patient or been notified  Fax weekly labs to [phone]  Clinic Follow Up Appt: 09/26/19  @

## 2019-09-04 NOTE — Progress Notes (Signed)
ANTICOAGULATION CONSULT NOTE - Follow Up Consult  Pharmacy Consult for warfarin (hold) + heparin Indication: LVAD  No Known Allergies  Patient Measurements: Height: 6\' 2"  (188 cm) Weight: 96.8 kg (213 lb 6.5 oz) IBW/kg (Calculated) : 82.2  Vital Signs: Temp: 97.4 F (36.3 C) (04/20 0753) Temp Source: Oral (04/20 0753) BP: 97/72 (04/20 0753) Pulse Rate: 85 (04/20 0753)  Labs: Recent Labs    09/02/19 0447 09/02/19 0532 09/02/19 1643 09/02/19 1643 09/03/19 0500 09/03/19 2001 09/04/19 0424  HGB 8.0*   < >  --   --  8.7*  --  8.1*  HCT 26.7*  --   --   --  28.7*  --  26.6*  PLT 230  --   --   --  248  --  250  LABPROT 16.8*  --   --   --  17.6*  --  17.2*  INR 1.4*  --   --   --  1.5*  --  1.4*  HEPARINUNFRC  --   --  <0.10*   < > <0.10* <0.10* <0.10*  CREATININE  --    < > 1.35*  --  1.26*  --  1.81*   < > = values in this interval not displayed.    Estimated Creatinine Clearance: 59.3 mL/min (A) (by C-G formula based on SCr of 1.81 mg/dL (H)).   Medical History: Past Medical History:  Diagnosis Date  . Asthma   . CHF (congestive heart failure) (HCC)    a. 09/2016: EF 20-25% with cath showing normal cors  . GERD (gastroesophageal reflux disease)   . History of hiatal hernia   . OSA on CPAP 09/06/2018   Severe OSA with AHI 68/hr on CPAP at 12cm H2O    Assessment: 48yom with HF s/p LVAD implant HM3 place 4/19 admitted with drive line infection. S/p OR on 4/15 for repeat I&D with application of wound vac.   Warfarin PTA 7.5mg  MW and 5mg  all other days admit INR elevated at 5 - probably acute infection related supra-sensitive.   INR 1.4, likely due to combination of holding late last week and rifabutin addition. After discussion with PVT on 4/19, ok to start low-dose heparin and keep INR 1.8-2.2 with OR planned for 4/21.  Heparin level undetectable this morning as expected. No rate change indicated.   Goal of Therapy:  INR 2-2.5 Monitor platelets by  anticoagulation protocol: Yes   Plan:  -Continue heparin 500 units/hr  -Hold Coumadin in anticipation of OR on Wednesday -Daily INR, CBC, heparin level  5/21, PharmD, [facility] PGY2 Cardiology Pharmacy Resident Phone [phone] 09/04/2019       7:55 AM  Please check [email or web address] for unit-specific pharmacist phone numbers

## 2019-09-04 NOTE — Progress Notes (Signed)
PHARMACY CONSULT NOTE FOR:  OUTPATIENT  PARENTERAL ANTIBIOTIC THERAPY (OPAT)  Indication: MSSA LVAD infection Regimen: Cefazolin 2 gm IV Q 8 hours End date: 10/14/19  IV antibiotic discharge orders are pended. To discharging provider:  please sign these orders via discharge navigator,  Select New Orders & click on the button choice - Manage This Unsigned Work.     Thank you for allowing pharmacy to be a part of this patient's care.  Sharin Mons, PharmD, BCPS, BCIDP Infectious Diseases Clinical Pharmacist Phone: [phone] 09/04/2019, 9:14 AM

## 2019-09-04 NOTE — Progress Notes (Signed)
LVAD Coordinator Rounding Note:  Admitted 08/16/19 by Dr. [name] due to VAD drive line infection/pneumonia/sepsis  HM III LVAD implanted on 09/06/17 by Dr. Cyndia Bent under Destination Therapy criteria.  Pt lying in bed, denies complaints this am. Reports he slept "pretty good" last night. Reports he did not wear CPAP last night because he was "on heating pad" for his back.   Pt confirms plan for OR tomorrow to "close" his incision and then home after his INR gets back to range.   Vital signs: Temp: 97.6 HR: 84 Doppler Pressure: 78 Automatic BP: 97/72 (81) O2 Sat: 100% on RA Wt: 227.5>231>235>233.9>236.3.....216>220>219>216>213>213 lbs   LVAD interrogation reveals:  Speed: 5600 Flow: 5.2 Power:  4.2w PI: 2.8 Alarms: none Events: none Hematocrit: 27  Fixed speed: 5600 Low speed limit: 5300  Drive Line:  Left abdominal drive line dressing C/D/I with anchor unattached. Anchor replaced. Weekly dressing changes per BS nurse.   Sternal wound: To be changed tomorrow in OR per Dr. Darcey Nora and Dr. Marla Roe.   Labs:  LDH trend: 227>230>271........208>187>161>175>168>172  INR trend: 4.5>5.0>1.7>1.7>1.7>1.8>1.7>2.1>3.4>2.5>2.7>2.1>1.4>1.5>1.4   WBC: 27.3>26.2>24>34.6>25.8>20.8......13.0>12.4>14.8>19.3  Alk Phos: 138> 177>123  AST: 53> 154>33  ALT: 17> 35>13  Infection: - blood cultures 08/16/19>>staphylococcus aureus - blood cultures 08/18/19>> NGTD - Drive line tunnel / sternum 08/20/19>> abundant WBC, gram + cocci, culture- few staph aureus - Drive line AFB 0/0/712>> pending - Drive line/Sternal Fungus culture 08/20/19>> negative - blood cultures 09/02/19>>negative/fina. - body fluid culture 08/28/19>> negative/final  Anticoagulation Plan: -INR Goal: 2.0 - 2.5  -ASA Dose: none  Adverse Events: - driveline cx 19/7/58>> staph aureus - Admitted for sternal/drive line infection. OR for debridement 08/20/19- + staph aureus wound cultures; further irrigation/debridements done  08/22/19, 08/24/19, 08/30/19   Plan/Recommendations:  1. Call VAD pager if any VAD equipment or drive line issues. 2. Weekly dressing changes by bedside nurse.  3. Planned muscle flap per Dr. Darcey Nora and Dr. Oletha Blend 08/05/19 at 1300 09/04/19. VAD Coordinator will accompany and remain with patient in OR.   Zada Girt RN [place] Coordinator  Office: [phone]  24/7 Pager: [phone]

## 2019-09-04 NOTE — Plan of Care (Signed)
  Problem: Education: Goal: Knowledge of General Education information will improve Description: Including pain rating scale, medication(s)/side effects and non-pharmacologic comfort measures Outcome: Progressing   Problem: Health Behavior/Discharge Planning: Goal: Ability to manage health-related needs will improve Outcome: Progressing   Problem: Clinical Measurements: Goal: Ability to maintain clinical measurements within normal limits will improve Outcome: Progressing Goal: Will remain free from infection Outcome: Progressing Goal: Diagnostic test results will improve Outcome: Progressing Goal: Cardiovascular complication will be avoided Outcome: Progressing   Problem: Nutrition: Goal: Adequate nutrition will be maintained Outcome: Progressing   Problem: Coping: Goal: Level of anxiety will decrease Outcome: Progressing   Problem: Elimination: Goal: Will not experience complications related to bowel motility Outcome: Progressing   Problem: Pain Managment: Goal: General experience of comfort will improve Outcome: Progressing   Problem: Safety: Goal: Ability to remain free from injury will improve Outcome: Progressing   Problem: Skin Integrity: Goal: Risk for impaired skin integrity will decrease Outcome: Progressing   Problem: Cardiac: Goal: LVAD will function as expected and patient will experience no clinical alarms Outcome: Progressing   Problem: Education: Goal: Patient will understand all VAD equipment and how it functions Outcome: Progressing Goal: Patient will be able to verbalize current INR target range and antiplatelet therapy for discharge home Outcome: Progressing   Problem: Cardiac: Goal: LVAD will function as expected and patient will experience no clinical alarms Outcome: Progressing

## 2019-09-04 NOTE — Progress Notes (Signed)
1 Day Post-Op Procedure(s) (LRB): WOUND VAC CHANGE (N/A) Application Of A-Cell Of Chest/Abdomen Subjective: Incisional pain stable after wound VAC change in OR yesterday Minimal serosanguineous drainage and wound VAC chamber White count has steadily been increasing. We will reculture and escalate antibiotic coverage prior to planned muscle flap closure in OR tomorrow. INR 1.4, heparin bridge will be discontinued 4 hours before surgery. Objective: Vital signs in last 24 hours: Temp:  [97.4 F (36.3 C)-98.7 F (37.1 C)] 97.6 F (36.4 C) (04/20 1113) Pulse Rate:  [82-126] 84 (04/20 1113) Cardiac Rhythm: Normal sinus rhythm (04/20 0700) Resp:  [14-24] 21 (04/20 1113) BP: (84-124)/(48-95) 84/48 (04/20 1113) SpO2:  [96 %-100 %] 100 % (04/20 1113) Weight:  [96.8 kg] 96.8 kg (04/20 0258)  Hemodynamic parameters for last 24 hours: CVP:  [9 mmHg-14 mmHg] 10 mmHg  Intake/Output from previous day: 04/19 0701 - 04/20 0700 In: 1755.8 [P.O.:480; I.V.:375.8; IV Piggyback:900] Out: 1603 [Urine:1600; Blood:3] Intake/Output this shift: Total I/O In: 46.7 [I.V.:46.7] Out: 650 [Urine:650]  Alert and comfortable Lungs clear Wound VAC sponge compressed Normal VAD hum Abdomen nontender Extremities with 2+ edema  Lab Results: Recent Labs    09/03/19 0500 09/04/19 0424  WBC 14.8* 19.3*  HGB 8.7* 8.1*  HCT 28.7* 26.6*  PLT 248 250   BMET:  Recent Labs    09/03/19 0500 09/04/19 0424  NA 132* 128*  K 3.6 3.8  CL 88* 85*  CO2 31 28  GLUCOSE 124* 251*  BUN 39* 47*  CREATININE 1.26* 1.81*  CALCIUM 9.1 8.7*    PT/INR:  Recent Labs    09/04/19 0424  LABPROT 17.2*  INR 1.4*   ABG    Component Value Date/Time   PHART 7.405 09/08/2017 0049   HCO3 23.4 09/08/2017 0049   TCO2 32 09/12/2017 1605   ACIDBASEDEF 1.0 09/08/2017 0049   O2SAT 67.0 09/04/2019 0409   CBG (last 3)  No results for input(s): GLUCAP in the last 72 hours.  Assessment/Plan: S/P Procedure(s)  (LRB): WOUND VAC CHANGE (N/A) Application Of A-Cell Of Chest/Abdomen Plan OR debridement and possible muscle flap closure of upper abdominal wound[LVAD driveline tunnel] in OR tomorrow Expand antibiotic coverage because of rising white count despite clean appearance of wound yesterday Stop heparin 4 hours before surgery.  LOS: 19 days    Kathlee Nations Trigt III 09/04/2019

## 2019-09-04 NOTE — Progress Notes (Signed)
Nutrition Follow-up  DOCUMENTATION CODES:   Not applicable  INTERVENTION:   -Add bedtime snack per pt request.  -Liberalize diet to regular    Continue Ensure Enlive po TID, each supplement provides 350 kcal and 20 grams of protein  Continue 30 ml Prostat BID, each supplement provides 100 kcals and 15 grams protein.   Continue Vitamin C 250 mg BID  NUTRITION DIAGNOSIS:   Inadequate oral intake related to decreased appetite as evidenced by per patient/family report.  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 PMH includes NICM with EF 10%, LVAD, HTN, EtOH abuse  4/5- s/p I&D w/ excisional debridement of subxiphoid abscess 4/7- s/p debridement, irrigation and packing of abdominal incision 4/9- s/p I&D abd wound with Acell and VAC application.  4/15- s/p I&D abd wound with VAC application 4/19- s/p I&D abd wound with Acell and VAC application   Planning possible reconstructive surgery with pec flap later this week.   Pt reports appetite is progressing after his last procedure. Meal completions lack documentation over the last three days. Drinking one Ensure daily and taking Prostat BID. Requesting to have a nighttime snack as dinner is served early. RD encouraged meal and supplement intake. Pt willing to try Ensure BID.   Admission weight: 103.2 kg  Current weight: 96.8 kg   I/O: -7,333 ml since 4/6 UOP: 1,600 ml x 24 hrs   Medications: Vit C 250 mg daily, colace, ferrous sulfate, MVI with minerals, senokot, aldactone, zinc sulfate  Labs: Na 128 (L) CBG 130-251 Cr 1.81-trending up   Diet Order:   Diet Order            Diet NPO time  specified  Diet effective midnight        Diet Heart Room service appropriate? Yes; Fluid consistency: Thin  Diet effective now              EDUCATION NEEDS:   Not appropriate for education at this time  Skin:  Skin Assessment: Skin Integrity Issues: Skin Integrity Issues:: Incisions Incisions: abdomen, chest  Last BM:  4/18  Height:   Ht Readings from Last 1 Encounters:  08/30/19 6\' 2"  (1.88 m)    Weight:   Wt Readings from Last 1 Encounters:  09/04/19 96.8 kg    BMI:  Body mass index is 27.4 kg/m.  Estimated Nutritional Needs:   Kcal:  2200-2400 kcals  Protein:  115-130 g  Fluid:  >/= 1.8 L  09/06/19 RD, LDN Clinical Nutrition Pager listed in AMION

## 2019-09-04 NOTE — Plan of Care (Signed)
  Problem: Education: Goal: Knowledge of General Education information will improve Description: Including pain rating scale, medication(s)/side effects and non-pharmacologic comfort measures Outcome: Progressing   Problem: Health Behavior/Discharge Planning: Goal: Ability to manage health-related needs will improve Outcome: Progressing   Problem: Clinical Measurements: Goal: Ability to maintain clinical measurements within normal limits will improve Outcome: Progressing Goal: Will remain free from infection Outcome: Progressing Goal: Diagnostic test results will improve Outcome: Progressing Goal: Cardiovascular complication will be avoided Outcome: Progressing   Problem: Nutrition: Goal: Adequate nutrition will be maintained Outcome: Progressing   Problem: Coping: Goal: Level of anxiety will decrease Outcome: Progressing   Problem: Elimination: Goal: Will not experience complications related to bowel motility Outcome: Progressing   Problem: Pain Managment: Goal: General experience of comfort will improve Outcome: Progressing   Problem: Safety: Goal: Ability to remain free from injury will improve Outcome: Progressing   Problem: Skin Integrity: Goal: Risk for impaired skin integrity will decrease Outcome: Progressing   Problem: Cardiac: Goal: LVAD will function as expected and patient will experience no clinical alarms Outcome: Progressing   Problem: Education: Goal: Patient will understand all VAD equipment and how it functions Outcome: Progressing Goal: Patient will be able to verbalize current INR target range and antiplatelet therapy for discharge home Outcome: Progressing   Problem: Cardiac: Goal: LVAD will function as expected and patient will experience no clinical alarms Outcome: Progressing   Problem: Education: Goal: Knowledge of the prescribed therapeutic regimen will improve Outcome: Progressing   Problem: Fluid Volume: Goal: Risk for excess  fluid volume will decrease Outcome: Progressing   Problem: Education: Goal: Required Educational Video(s) Outcome: Progressing   Problem: Clinical Measurements: Goal: Postoperative complications will be avoided or minimized Outcome: Progressing   Problem: Skin Integrity: Goal: Demonstration of wound healing without infection will improve Outcome: Progressing   

## 2019-09-04 NOTE — Progress Notes (Signed)
Patient ID: Jonathan Wood, male   DOB: 1970/06/09, 49 y.o.   MRN: [id number]     Advanced Heart Failure Rounding Note  PCP-Cardiologist: Arvilla Meres, MD    Patient Profile   48 y.o. 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  Subjective:    - 4/5 S/p I&D w/ excisional debridement of subxiphoid abscess. Cultures + Staph Aureus  - 4/7, Returned to OR for debridement, Irrigation and Packing of Abdominal Incision - 4/9 S/P I&D abd wound with Acell and VAC application.  - 4/15 s/p I&D wound with VAC application.  - 4/19 S/P I&D with VAC application.   Remains on IV abx.   Underwent repeat I&D yesterday. Still sore. Afebrile. Remains on IV laisix.weight stable overnight (down 28 pounds total)  HM3 VAD Interrogation: Speed:5600 Flow:5.3 Power:4.0  PI:2.7  VAD interrogated personally. Parameters stable.   Objective:   Weight Range: 96.8 kg Body mass index is 27.4 kg/m.   Vital Signs:   Temp:  [97.4 F (36.3 C)-98.9 F (37.2 C)] 97.4 F (36.3 C) (04/20 0753) Pulse Rate:  [43-126] 85 (04/20 0753) Resp:  [14-28] 14 (04/20 0753) BP: (85-124)/(67-95) 97/72 (04/20 0753) SpO2:  [96 %-100 %] 100 % (04/20 0753) Weight:  [96.8 kg] 96.8 kg (04/20 0258) Last BM Date: 09/02/19  Weight change: Filed Weights   09/02/19 0359 09/03/19 0500 09/04/19 0258  Weight: 98.3 kg 96.9 kg 96.8 kg    Intake/Output:   Intake/Output Summary (Last 24 hours) at 09/04/2019 0810 Last data filed at 09/04/2019 0500 Gross per 24 hour  Intake 1755.83 ml  Output 1603 ml  Net 152.83 ml    Telemetry: NSR 80-90s Personally reviewed   Physical Exam    Physical Exam: General:  NAD.  HEENT: normal  Neck: supple. JVP not elevated.  Carotids 2+ bilat; no bruits. No lymphadenopathy or thryomegaly appreciated. Cor: LVAD hum.  Lungs:  Clear. Abdomen: obese soft, minimally tender, non-distended. + dressing/wound vac No hepatosplenomegaly. No bruits or masses. Good bowel sounds. Driveline site clean. Anchor in place.   Extremities: no cyanosis, clubbing, rash. Warm no edema  Neuro: alert & oriented x 3. No focal deficits. Moves all 4 without problem      Labs    CBC Recent Labs    09/02/19 0447 09/02/19 0447 09/03/19 0500 09/04/19 0424  WBC 12.4*   < > 14.8* 19.3*  NEUTROABS 8.7*  --  10.3*  --   HGB 8.0*   < > 8.7* 8.1*  HCT 26.7*   < > 28.7* 26.6*  MCV 95.4   < > 96.0 95.3  PLT 230   < > 248 250   < > = values in this interval not displayed.   Basic Metabolic Panel Recent Labs    67/12/45 0500 09/04/19 0424  NA 132* 128*  K 3.6 3.8  CL 88* 85*  CO2 31 28  GLUCOSE 124* 251*  BUN 39* 47*  CREATININE 1.26* 1.81*  CALCIUM 9.1 8.7*  MG 1.6*  --    Liver Function Tests No results for input(s): AST, ALT, ALKPHOS, BILITOT, PROT, ALBUMIN in the last 72 hours. No results for input(s): LIPASE, AMYLASE in the last 72 hours. Cardiac Enzymes No results for input(s): CKTOTAL, CKMB, CKMBINDEX, TROPONINI in the last 72 hours.  BNP: BNP (last 3 results) Recent Labs    03/21/19 1415  04/23/19 1350  BNP 426.9* 709.6*    ProBNP (last 3 results) No results for input(s): PROBNP in the last 8760 hours.   D-Dimer No results for input(s): DDIMER in the last 72 hours. Hemoglobin A1C No results for input(s): HGBA1C in the last 72 hours. Fasting Lipid Panel No results for input(s): CHOL, HDL, LDLCALC, TRIG, CHOLHDL, LDLDIRECT in the last 72 hours. Thyroid Function Tests No results for input(s): TSH, T4TOTAL, T3FREE, THYROIDAB in the last 72 hours.  Invalid input(s): FREET3  Other results:   Imaging    No results found.   Medications:     Scheduled Medications: . amiodarone  200 mg Oral BID  . vitamin C  250 mg Oral Daily  . bisacodyl  10 mg Oral Daily  . Chlorhexidine Gluconate Cloth  6 each  Topical Daily  . docusate sodium  100 mg Oral TID  . DULoxetine  30 mg Oral Daily  . feeding supplement (ENSURE ENLIVE)  237 mL Oral TID BM  . feeding supplement (PRO-STAT SUGAR FREE 64)  30 mL Oral BID  . ferrous sulfate  325 mg Oral Q breakfast  . furosemide  80 mg Intravenous BID  . gabapentin  300 mg Oral TID  . hydrALAZINE  75 mg Oral TID  . linaclotide  145 mcg Oral Daily  . mouth rinse  15 mL Mouth Rinse BID  . multivitamin with minerals  1 tablet Oral Daily  . pantoprazole  40 mg Oral BID  . rifabutin  300 mg Oral Daily  . senna-docusate  1 tablet Oral QHS  . sildenafil  20 mg Oral TID  . sodium chloride flush  10-40 mL Intracatheter Q12H  . sodium chloride flush  3 mL Intravenous Q12H  . spironolactone  12.5 mg Oral Daily  . Warfarin - Pharmacist Dosing Inpatient   Does not apply q1600  . zinc sulfate  220 mg Oral Daily    Infusions: . sodium chloride 10 mL/hr at 08/30/19 1000  .  ceFAZolin (ANCEF) IV 2 g (09/04/19 0415)  . dextrose 5 % and 0.45% NaCl Stopped (08/25/19 1414)  . heparin 500 Units/hr (09/03/19 1250)  . lactated ringers    . phenylephrine (NEO-SYNEPHRINE) Adult infusion      PRN Medications: sodium chloride, acetaminophen, albuterol, alum & mag hydroxide-simeth, bisacodyl **OR** bisacodyl, guaiFENesin-dextromethorphan, hydrOXYzine, magnesium hydroxide, ondansetron (ZOFRAN) IV, oxyCODONE, sodium chloride flush, sodium chloride flush, traMADol    Assessment/Plan    1. Fever & leukocytosis -> MSSA bacteremia/ Subxiphoid Abscess  - bcx 2/2 MSSA - ID has changed antibiotics to cefazolin, now has PICC.   - WBC trending down.  Now 12.4.  Afebrile. - He is not a candidate for VAD exchange due to severe RV failure and noncompliance.  - S/p I&D on 4/5. Wound cultures +MSSA.  - Back to OR 4/7, 4/9, 4/15 , 4/19 debridement and irrigation of driveline + packing, wound vac in place.    - ID recommends 6 weeks of IV cefazolin followed by long-term suppressive  oral cephalexin. He has PICC line. Remains afebrile - Planning possible reconstructive surgery with pec flap later this week.   - INR is 1.4  - Continue low-dose heparin. Discussed dosing with PharmD personally. 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  S/p HM-3 LVAD on 09/06/17 - On sildenafil for RV failure.  - CO-OX 67% - CVP 10-11. Volume status  Much improved. Creatinine up 1.2 -> 1.8 -  Stop IV lasix. Can give some fluid back as needed - Continue spiro - Continue current dose of hydralazine and sildenafil. No imdur with sildenafil.    3. HM3 LVAD 09/06/2017. - VAD interrogated personally. Parameters stable. - LDH 172 today  - INR 1.4 on low-dose heparin. Management as above - Off ASA with PUD.    4. Essential HTN - MAP 70s-80s  - Continue hydralazine 75 mg tid. - Continue sildenafil 20 mg tid     5. AKI - Creatinine back up today in setting of IV diuresis. - will give 500cc IVF  6. OSA - sleep study with very severe OSA (AHI 69/hr) - non-compliant Bipap in the past, has refused to use.  - He continues to tell respiratory that he will wear the machine himself.   -He is only using intermittently.   7. H/o GI bleed  - 08/2018 EGD completed and showed gastritis and nonbleeding duodenal ulcers, colonoscopy with 5 polyps removed. Protonix was increased to BID.  - No recent GI bleeding - hgb 8.1 today, relatively stable.  No overt bleeding.  - Transfuse hgb < 7.  - He will stay off aspirin for now.   8. Tobacco Abuse  - says he quit recently  52. Severe protein calorie malnutrition  - Albumin 2.1 on 4/14 - nutrition seeing   10. Constipation  - Abd plain film 4/12 unremarkable  - suspect related to pain meds - also h/o IBS  - Stable today.  11. Hypokalemia -K 3.8  - Supp K   12. Hypo magnesium - mag supped yesterday  Length of Stay: [place], MD  09/04/2019, 8:10 AM  Advanced Heart  Failure Team Pager [phone] (M-F; [place])  Please contact [facility] for night-coverage after hours (4p -7a ) and weekends on [email or web address] 8:10 AM

## 2019-09-05 ENCOUNTER — Inpatient Hospital Stay (HOSPITAL_COMMUNITY): Payer: Medicare Other

## 2019-09-05 ENCOUNTER — Encounter (HOSPITAL_COMMUNITY): Payer: Self-pay | Admitting: Internal Medicine

## 2019-09-05 ENCOUNTER — Encounter (HOSPITAL_COMMUNITY)
Admission: EM | Disposition: A | Discharge status: Home Health | Payer: Self-pay | Source: Ambulatory Visit | Attending: Internal Medicine

## 2019-09-05 DIAGNOSIS — T827XXA Infection and inflammatory reaction due to other cardiac and vascular devices, implants and grafts, initial encounter: Secondary | ICD-10-CM

## 2019-09-05 DIAGNOSIS — I5023 Acute on chronic systolic (congestive) heart failure: Secondary | ICD-10-CM | POA: Diagnosis not present

## 2019-09-05 DIAGNOSIS — Z20822 Contact with and (suspected) exposure to covid-19: Secondary | ICD-10-CM | POA: Diagnosis not present

## 2019-09-05 DIAGNOSIS — A4101 Sepsis due to Methicillin susceptible Staphylococcus aureus: Secondary | ICD-10-CM | POA: Diagnosis not present

## 2019-09-05 HISTORY — PX: APPLICATION OF A-CELL OF CHEST/ABDOMEN: SHX6302

## 2019-09-05 HISTORY — PX: STERNAL WOUND DEBRIDEMENT: SHX1058

## 2019-09-05 HISTORY — PX: MUSCLE FLAP CLOSURE: SHX2054

## 2019-09-05 LAB — CBC
HCT: 30.8 % — ABNORMAL LOW (ref 39.0–52.0)
Hemoglobin: 9.4 g/dL — ABNORMAL LOW (ref 13.0–17.0)
MCH: 28.9 pg (ref 26.0–34.0)
MCHC: 30.5 g/dL (ref 30.0–36.0)
MCV: 94.8 fL (ref 80.0–100.0)
Platelets: 219 10*3/uL (ref 150–400)
RBC: 3.25 MIL/uL — ABNORMAL LOW (ref 4.22–5.81)
RDW: 18.6 % — ABNORMAL HIGH (ref 11.5–15.5)
WBC: 11.9 10*3/uL — ABNORMAL HIGH (ref 4.0–10.5)
nRBC: 0 % (ref 0.0–0.2)

## 2019-09-05 LAB — POCT I-STAT 7, (LYTES, BLD GAS, ICA,H+H)
Acid-Base Excess: 4 mmol/L — ABNORMAL HIGH (ref 0.0–2.0)
Bicarbonate: 29.3 mmol/L — ABNORMAL HIGH (ref 20.0–28.0)
Calcium, Ion: 1.1 mmol/L — ABNORMAL LOW (ref 1.15–1.40)
HCT: 31 % — ABNORMAL LOW (ref 39.0–52.0)
Hemoglobin: 10.5 g/dL — ABNORMAL LOW (ref 13.0–17.0)
O2 Saturation: 100 %
Potassium: 4.3 mmol/L (ref 3.5–5.1)
Sodium: 133 mmol/L — ABNORMAL LOW (ref 135–145)
TCO2: 31 mmol/L (ref 22–32)

## 2019-09-05 LAB — BASIC METABOLIC PANEL
Anion gap: 10 (ref 5–15)
BUN: 43 mg/dL — ABNORMAL HIGH (ref 6–20)
CO2: 29 mmol/L (ref 22–32)
Calcium: 8.4 mg/dL — ABNORMAL LOW (ref 8.9–10.3)
Chloride: 95 mmol/L — ABNORMAL LOW (ref 98–111)
Creatinine, Ser: 1.53 mg/dL — ABNORMAL HIGH (ref 0.61–1.24)
GFR calc Af Amer: >60 mL/min (ref >60)
GFR calc non Af Amer: 54 mL/min — ABNORMAL LOW (ref >60)
Glucose, Bld: 126 mg/dL — ABNORMAL HIGH (ref 70–99)
Potassium: 3.4 mmol/L — ABNORMAL LOW (ref 3.5–5.1)
Sodium: 134 mmol/L — ABNORMAL LOW (ref 135–145)

## 2019-09-05 LAB — GLUCOSE, CAPILLARY: Glucose-Capillary: 143 mg/dL — ABNORMAL HIGH (ref 70–99)

## 2019-09-05 LAB — PROTIME-INR
INR: 1.5 — ABNORMAL HIGH (ref 0.8–1.2)
Prothrombin Time: 18 seconds — ABNORMAL HIGH (ref 11.4–15.2)

## 2019-09-05 LAB — MAGNESIUM: Magnesium: 1.5 mg/dL — ABNORMAL LOW (ref 1.7–2.4)

## 2019-09-05 LAB — COOXEMETRY PANEL
Carboxyhemoglobin: 2.5 % — ABNORMAL HIGH (ref 0.5–1.5)
Methemoglobin: 1 % (ref 0.0–1.5)
O2 Saturation: 59.8 %
Total hemoglobin: 11 g/dL — ABNORMAL LOW (ref 12.0–16.0)

## 2019-09-05 LAB — LACTATE DEHYDROGENASE: LDH: 149 U/L (ref 98–192)

## 2019-09-05 LAB — HEPARIN LEVEL (UNFRACTIONATED): Heparin Unfractionated: <0.1 IU/mL — ABNORMAL LOW (ref 0.30–0.70)

## 2019-09-05 SURGERY — DEBRIDEMENT, WOUND, STERNUM
Anesthesia: General | Site: Abdomen

## 2019-09-05 MED ORDER — ETOMIDATE 2 MG/ML IV SOLN
INTRAVENOUS | Status: DC | PRN
  Administered 2019-09-05: 18 mg via INTRAVENOUS

## 2019-09-05 MED ORDER — SODIUM CHLORIDE 0.9 % IV SOLN
INTRAVENOUS | Status: AC
  Filled 2019-09-05: qty 500000

## 2019-09-05 MED ORDER — HEMOSTATIC AGENTS (NO CHARGE) OPTIME
TOPICAL | Status: DC | PRN
  Administered 2019-09-05: 1 via TOPICAL

## 2019-09-05 MED ORDER — SODIUM CHLORIDE 0.9 % IR SOLN
Status: DC | PRN
  Administered 2019-09-05: 1000 mL

## 2019-09-05 MED ORDER — SODIUM CHLORIDE 0.9% FLUSH: 9.0000 mL | INTRAVENOUS | Status: DC | PRN

## 2019-09-05 MED ORDER — POTASSIUM CHLORIDE CRYS ER 20 MEQ PO TBCR
40.0000 meq | EXTENDED_RELEASE_TABLET | Freq: Two times a day (BID) | ORAL | Status: AC
  Administered 2019-09-05 (×2): 40 meq via ORAL
  Filled 2019-09-05 (×2): qty 2

## 2019-09-05 MED ORDER — NALOXONE HCL 0.4 MG/ML IJ SOLN: 0.4000 mg | INTRAMUSCULAR | Status: DC | PRN

## 2019-09-05 MED ORDER — PROPOFOL 10 MG/ML IV BOLUS
INTRAVENOUS | Status: AC
  Filled 2019-09-05: qty 20

## 2019-09-05 MED ORDER — DIPHENHYDRAMINE HCL 12.5 MG/5ML PO ELIX: 12.5000 mg | ORAL_SOLUTION | Freq: Four times a day (QID) | ORAL | Status: DC | PRN

## 2019-09-05 MED ORDER — GLYCOPYRROLATE PF 0.2 MG/ML IJ SOSY
PREFILLED_SYRINGE | INTRAMUSCULAR | Status: AC
  Filled 2019-09-05: qty 1

## 2019-09-05 MED ORDER — LIDOCAINE 2% (20 MG/ML) 5 ML SYRINGE
INTRAMUSCULAR | Status: DC | PRN
  Administered 2019-09-05: 100 mg via INTRAVENOUS

## 2019-09-05 MED ORDER — HYDROMORPHONE HCL 1 MG/ML IJ SOLN
1.0000 mg | Freq: Once | INTRAMUSCULAR | Status: AC
  Administered 2019-09-05: 1 mg via INTRAVENOUS
  Filled 2019-09-05: qty 1

## 2019-09-05 MED ORDER — MAGNESIUM SULFATE 4 GM/100ML IV SOLN
4.0000 g | Freq: Once | INTRAVENOUS | Status: AC
  Administered 2019-09-05: 4 g via INTRAVENOUS
  Filled 2019-09-05: qty 100

## 2019-09-05 MED ORDER — FENTANYL 50 MCG/ML IV PCA SOLN
INTRAVENOUS | Status: AC
  Filled 2019-09-05: qty 20

## 2019-09-05 MED ORDER — DEXAMETHASONE SODIUM PHOSPHATE 10 MG/ML IJ SOLN
INTRAMUSCULAR | Status: AC
  Filled 2019-09-05: qty 1

## 2019-09-05 MED ORDER — FENTANYL 50 MCG/ML IV PCA SOLN
INTRAVENOUS | Status: DC
  Administered 2019-09-05: 55 ug via INTRAVENOUS
  Administered 2019-09-06: 80 ug via INTRAVENOUS
  Administered 2019-09-06: 20 ug via INTRAVENOUS
  Administered 2019-09-06 (×2): 40 ug via INTRAVENOUS
  Administered 2019-09-06: 140 ug via INTRAVENOUS
  Administered 2019-09-07: 260 ug via INTRAVENOUS
  Administered 2019-09-07: 100 ug via INTRAVENOUS
  Administered 2019-09-07: 60 ug via INTRAVENOUS
  Administered 2019-09-07: 20 ug via INTRAVENOUS
  Administered 2019-09-07: 120 ug via INTRAVENOUS
  Administered 2019-09-08: 180 ug via INTRAVENOUS
  Administered 2019-09-08: 140 ug via INTRAVENOUS
  Filled 2019-09-05: qty 20

## 2019-09-05 MED ORDER — SUGAMMADEX SODIUM 200 MG/2ML IV SOLN
INTRAVENOUS | Status: DC | PRN
  Administered 2019-09-05: 200 mg via INTRAVENOUS

## 2019-09-05 MED ORDER — DIPHENHYDRAMINE HCL 50 MG/ML IJ SOLN: 12.5000 mg | Freq: Four times a day (QID) | INTRAMUSCULAR | Status: DC | PRN

## 2019-09-05 MED ORDER — SODIUM CHLORIDE 0.9% IV SOLUTION: Freq: Once | INTRAVENOUS | Status: DC

## 2019-09-05 MED ORDER — FENTANYL 50 MCG/ML IV PCA SOLN
INTRAVENOUS | Status: DC
  Administered 2019-09-05: 135 ug via INTRAVENOUS

## 2019-09-05 MED ORDER — MIDAZOLAM HCL 2 MG/2ML IJ SOLN
INTRAMUSCULAR | Status: AC
  Filled 2019-09-05: qty 2

## 2019-09-05 MED ORDER — LIDOCAINE 2% (20 MG/ML) 5 ML SYRINGE
INTRAMUSCULAR | Status: AC
  Filled 2019-09-05: qty 10

## 2019-09-05 MED ORDER — DIPHENHYDRAMINE HCL 12.5 MG/5ML PO ELIX
12.5000 mg | ORAL_SOLUTION | Freq: Four times a day (QID) | ORAL | Status: DC | PRN
  Administered 2019-09-06: 22:00:00 12.5 mg via ORAL
  Filled 2019-09-05: qty 5

## 2019-09-05 MED ORDER — ROCURONIUM BROMIDE 10 MG/ML (PF) SYRINGE
PREFILLED_SYRINGE | INTRAVENOUS | Status: DC | PRN
  Administered 2019-09-05: 50 mg via INTRAVENOUS
  Administered 2019-09-05: 20 mg via INTRAVENOUS
  Administered 2019-09-05: 30 mg via INTRAVENOUS

## 2019-09-05 MED ORDER — FENTANYL CITRATE (PF) 250 MCG/5ML IJ SOLN
INTRAMUSCULAR | Status: AC
  Filled 2019-09-05: qty 5

## 2019-09-05 MED ORDER — DEXAMETHASONE SODIUM PHOSPHATE 10 MG/ML IJ SOLN
INTRAMUSCULAR | Status: DC | PRN
  Administered 2019-09-05: 10 mg via INTRAVENOUS

## 2019-09-05 MED ORDER — ONDANSETRON HCL 4 MG/2ML IJ SOLN: 4.0000 mg | Freq: Four times a day (QID) | INTRAMUSCULAR | Status: DC | PRN

## 2019-09-05 MED ORDER — PROMETHAZINE HCL 25 MG/ML IJ SOLN: 6.2500 mg | INTRAMUSCULAR | Status: DC | PRN

## 2019-09-05 MED ORDER — FENTANYL CITRATE (PF) 250 MCG/5ML IJ SOLN
INTRAMUSCULAR | Status: DC | PRN
  Administered 2019-09-05 (×5): 50 ug via INTRAVENOUS

## 2019-09-05 MED ORDER — MIDAZOLAM HCL 2 MG/2ML IJ SOLN
INTRAMUSCULAR | Status: DC | PRN
  Administered 2019-09-05 (×2): 1 mg via INTRAVENOUS

## 2019-09-05 MED ORDER — 0.9 % SODIUM CHLORIDE (POUR BTL) OPTIME
TOPICAL | Status: DC | PRN
  Administered 2019-09-05: 1000 mL

## 2019-09-05 MED ORDER — SUCCINYLCHOLINE CHLORIDE 200 MG/10ML IV SOSY
PREFILLED_SYRINGE | INTRAVENOUS | Status: DC | PRN
  Administered 2019-09-05: 120 mg via INTRAVENOUS

## 2019-09-05 MED ORDER — EPHEDRINE 5 MG/ML INJ
INTRAVENOUS | Status: AC
  Filled 2019-09-05: qty 20

## 2019-09-05 MED ORDER — SODIUM CHLORIDE 0.9 % IV SOLN
INTRAVENOUS | Status: DC | PRN
  Administered 2019-09-05: 500 mL

## 2019-09-05 MED ORDER — LIDOCAINE-EPINEPHRINE 1 %-1:100000 IJ SOLN
INTRAMUSCULAR | Status: DC | PRN
  Administered 2019-09-05: 6 mL

## 2019-09-05 MED ORDER — HYDROMORPHONE HCL 1 MG/ML IJ SOLN: 0.2500 mg | INTRAMUSCULAR | Status: DC | PRN

## 2019-09-05 MED ORDER — ALBUMIN HUMAN 5 % IV SOLN: INTRAVENOUS | Status: DC | PRN

## 2019-09-05 MED ORDER — LIDOCAINE-EPINEPHRINE 1 %-1:100000 IJ SOLN
INTRAMUSCULAR | Status: AC
  Filled 2019-09-05: qty 2

## 2019-09-05 MED ORDER — ONDANSETRON HCL 4 MG/2ML IJ SOLN
INTRAMUSCULAR | Status: AC
  Filled 2019-09-05: qty 2

## 2019-09-05 SURGICAL SUPPLY — 64 items
APPLIER CLIP 9.375 MED OPEN (MISCELLANEOUS) ×3
APR CLP MED 9.3 20 MLT OPN (MISCELLANEOUS) ×1
BIOPATCH RED 1 DISK 7.0 (GAUZE/BANDAGES/DRESSINGS) ×2 IMPLANT
BIOPATCH RED 1IN DISK 7.0MM (GAUZE/BANDAGES/DRESSINGS) ×2
BLADE SURG 10 STRL SS (BLADE) ×3 IMPLANT
BLADE SURG 15 STRL LF DISP TIS (BLADE) ×1 IMPLANT
BLADE SURG 15 STRL SS (BLADE) ×3
CANISTER WOUND CARE 500ML ATS (WOUND CARE) ×2 IMPLANT
CLIP APPLIE 9.375 MED OPEN (MISCELLANEOUS) ×1 IMPLANT
CNTNR URN SCR LID CUP LEK RST (MISCELLANEOUS) IMPLANT
CONT SPEC 4OZ STRL OR WHT (MISCELLANEOUS) ×3
COVER SURGICAL LIGHT HANDLE (MISCELLANEOUS) ×5 IMPLANT
DECANTER SPIKE VIAL GLASS SM (MISCELLANEOUS) ×2 IMPLANT
DRAIN CHANNEL 19F RND (DRAIN) ×6 IMPLANT
DRAPE HALF SHEET 40X57 (DRAPES) ×6 IMPLANT
DRAPE INCISE IOBAN 85X60 (DRAPES) ×2 IMPLANT
DRAPE ORTHO SPLIT 77X108 STRL (DRAPES) ×6
DRAPE SURG ORHT 6 SPLT 77X108 (DRAPES) ×2 IMPLANT
DRSG HYDROCOLLOID 4X4 (GAUZE/BANDAGES/DRESSINGS) ×2 IMPLANT
DRSG MEPILEX BORDER 4X8 (GAUZE/BANDAGES/DRESSINGS) ×1 IMPLANT
DRSG PAD ABDOMINAL 8X10 ST (GAUZE/BANDAGES/DRESSINGS) ×2 IMPLANT
DRSG VAC ATS SM SENSATRAC (GAUZE/BANDAGES/DRESSINGS) ×2 IMPLANT
ELECT BLADE 6.5 EXT (BLADE) ×1 IMPLANT
ELECT CAUTERY BLADE 6.4 (BLADE) ×5 IMPLANT
ELECT REM PT RETURN 9FT ADLT (ELECTROSURGICAL) ×3
ELECTRODE REM PT RTRN 9FT ADLT (ELECTROSURGICAL) ×1 IMPLANT
EVACUATOR SILICONE 100CC (DRAIN) ×6 IMPLANT
GAUZE SPONGE 4X4 12PLY STRL (GAUZE/BANDAGES/DRESSINGS) ×4 IMPLANT
GAUZE XEROFORM 5X9 LF (GAUZE/BANDAGES/DRESSINGS) ×1 IMPLANT
GLOVE BIO SURGEON STRL SZ 6.5 (GLOVE) ×4 IMPLANT
GLOVE BIO SURGEONS STRL SZ 6.5 (GLOVE) ×2
GOWN STRL REUS W/ TWL LRG LVL3 (GOWN DISPOSABLE) ×4 IMPLANT
GOWN STRL REUS W/TWL LRG LVL3 (GOWN DISPOSABLE) ×12
KIT BASIN OR (CUSTOM PROCEDURE TRAY) ×3 IMPLANT
MICROMATRIX 500MG (Tissue) ×3 IMPLANT
NDL HYPO 25GX1X1/2 BEV (NEEDLE) IMPLANT
NEEDLE HYPO 25GX1X1/2 BEV (NEEDLE) ×3 IMPLANT
NS IRRIG 1000ML POUR BTL (IV SOLUTION) ×4 IMPLANT
PACK GENERAL/GYN (CUSTOM PROCEDURE TRAY) ×3 IMPLANT
PAD ARMBOARD 7.5X6 YLW CONV (MISCELLANEOUS) ×7 IMPLANT
PENCIL BUTTON HOLSTER BLD 10FT (ELECTRODE) ×3 IMPLANT
PIN SAFETY STERILE (MISCELLANEOUS) ×2 IMPLANT
POWDER SURGICEL 3.0 GRAM (HEMOSTASIS) ×2 IMPLANT
PROBE DEBRIDE SONICVAC MISONIX (TIP) ×2 IMPLANT
SET ADHESIVE SKIN CLSR ABRA (MISCELLANEOUS) ×6 IMPLANT
SOLUTION PARTIC MCRMTRX 500MG (Tissue) IMPLANT
SPONGE LAP 18X18 RF (DISPOSABLE) ×2 IMPLANT
STAPLER VISISTAT 35W (STAPLE) ×1 IMPLANT
SUT ETHILON 2 0 FS 18 (SUTURE) ×6 IMPLANT
SUT MNCRL AB 3-0 PS2 18 (SUTURE) ×8 IMPLANT
SUT MNCRL AB 4-0 PS2 18 (SUTURE) ×10 IMPLANT
SUT MON AB 5-0 PS2 18 (SUTURE) ×6 IMPLANT
SUT PDS AB 2-0 CT1 27 (SUTURE) ×6 IMPLANT
SUT PDS AB 3-0 SH 27 (SUTURE) ×8 IMPLANT
SUT SILK 3 0 SH CR/8 (SUTURE) ×2 IMPLANT
SUT VIC AB 3-0 PS2 18 (SUTURE)
SUT VIC AB 3-0 PS2 18XBRD (SUTURE) ×4 IMPLANT
SUT VIC AB 3-0 SH 18 (SUTURE) ×1 IMPLANT
SYR BULB IRRIGATION 50ML (SYRINGE) ×3 IMPLANT
SYR CONTROL 10ML LL (SYRINGE) ×2 IMPLANT
TOWEL GREEN STERILE (TOWEL DISPOSABLE) ×3 IMPLANT
TOWEL GREEN STERILE FF (TOWEL DISPOSABLE) ×3 IMPLANT
TRAY FOLEY SLVR 16FR TEMP STAT (SET/KITS/TRAYS/PACK) ×2 IMPLANT
TUBE IRRIGATION SET MISONIX (TUBING) ×2 IMPLANT

## 2019-09-05 NOTE — H&P (View-Only) (Signed)
[facility]  Reason for Consult: LVAD driveline infection Referring Physician: Dr. Tharon Aquas Trigt  Jonathan Wood is an 49 y.o. male.  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  Patient presented to ED on 08/16/2019 after being evaluated at the LVAD clinic.  At clinic visit patient was noted to have elevated white count of 27K.  Blood culture on 08/16/2019 noted to have staph aureus, MSSA.  Infectious disease consulted for management.  Patient underwent incision and drainage of LVAD tunnel abscess and substernal abdominal wall on 08/20/2019 by Dr. Prescott Gum.  He underwent multiple more debridements, placement of ACell, placement of wound VAC during his current hospital stay.  Last procedure was on 09/03/2019: Irrigation of abdominal wound, LVAD driveline tunnel, application of ACell, application of wound VAC by Dr. Prescott Gum.  Plastic surgery consulted for assistance with muscle flap and closure.  Jonathan Wood reports that he is feeling fine today, no fevers, chills, nausea, vomiting, chest pain, shortness of breath.  He reports that his mouth is dry.  No other complaints.   Past Medical History:  Diagnosis Date  . Asthma   . CHF (congestive heart failure) ([place])    a. 09/2016: EF 20-25% with cath showing normal cors  . GERD (gastroesophageal reflux disease)   . History of hiatal hernia   . OSA on CPAP 09/06/2018   Severe OSA with AHI 68/hr on CPAP at 12cm H2O    Past Surgical History:  Procedure Laterality Date  . APPLICATION OF A-CELL OF CHEST/ABDOMEN N/A 08/24/2019   Procedure: Application Of A-Cell Of Chest/Abdomen;  Surgeon: Ivin Poot, MD;  Location: [place];  Service: Thoracic;  Laterality: N/A;  . APPLICATION OF A-CELL OF CHEST/ABDOMEN  09/03/2019   Procedure: Application Of A-Cell Of Chest/Abdomen;  Surgeon:  Ivin Poot, MD;  Location: [place];  Service: Thoracic;;  . APPLICATION OF WOUND VAC N/A 08/22/2019   Procedure: Debridement, Irrigation and Packing of Abdominal Incision.;  Surgeon: Ivin Poot, MD;  Location: [place];  Service: Thoracic;  Laterality: N/A;  . APPLICATION OF WOUND VAC N/A 08/24/2019   Procedure: Irrigation and Debridement with WOUND VAC APPLICATION;  Surgeon: Ivin Poot, MD;  Location: [place];  Service: Thoracic;  Laterality: N/A;  . APPLICATION OF WOUND VAC N/A 08/30/2019   Procedure: APPLICATION OF WOUND VAC;  Surgeon: Ivin Poot, MD;  Location: [place];  Service: Thoracic;  Laterality: N/A;  . APPLICATION OF WOUND VAC N/A 09/03/2019   Procedure: WOUND VAC CHANGE;  Surgeon: Ivin Poot, MD;  Location: [place];  Service: Thoracic;  Laterality: N/A;  . BIOPSY  08/23/2018   Procedure: BIOPSY;  Surgeon: Irving Copas., MD;  Location: [place];  Service: Gastroenterology;;  . COLONOSCOPY N/A 08/23/2018   Procedure: COLONOSCOPY;  Surgeon: Irving Copas., MD;  Location: [place];  Service: Gastroenterology;  Laterality: N/A;  . ENTEROSCOPY N/A 08/23/2018   Procedure: ENTEROSCOPY;  Surgeon: Rush Landmark Telford Nab., MD;  Location: [place];  Service: Gastroenterology;  Laterality: N/A;  . HEMOSTASIS CLIP PLACEMENT  08/23/2018   Procedure: HEMOSTASIS CLIP PLACEMENT;  Surgeon: Irving Copas., MD;  Location: [place];  Service: Gastroenterology;;  . IABP INSERTION N/A 09/05/2017   Procedure: IABP INSERTION;  Surgeon: Jolaine Artist, MD;  Location: [place] [facility];  Service: Cardiovascular;  Laterality: N/A;  . INSERTION OF IMPLANTABLE LEFT VENTRICULAR ASSIST DEVICE N/A 09/06/2017   Procedure: INSERTION OF IMPLANTABLE LEFT VENTRICULAR ASSIST DEVICE - HM3;  Surgeon: Ivin Poot, MD;  Location: [place];  Service: Open Heart Surgery;  Laterality: N/A;  [place]  . POLYPECTOMY  08/23/2018   Procedure:  POLYPECTOMY;  Surgeon: Mansouraty, Telford Nab., MD;  Location: [place];  Service: Gastroenterology;;  . RIGHT HEART CATH N/A 08/30/2017   Procedure: RIGHT HEART CATH;  Surgeon: Jolaine Artist, MD;  Location: [place] [facility];  Service: Cardiovascular;  Laterality: N/A;  . RIGHT/LEFT HEART CATH AND CORONARY ANGIOGRAPHY N/A 10/08/2016   Procedure: Right/Left Heart Cath and Coronary Angiography;  Surgeon: Dixie Dials, MD;  Location: [place] [facility];  Service: Cardiovascular;  Laterality: N/A;  . STERNAL WOUND DEBRIDEMENT N/A 08/20/2019   Procedure: DEBRIDEMENT OF LVAD DRIVELINE TUNNEL;  Surgeon: Ivin Poot, MD;  Location: [place];  Service: Thoracic;  Laterality: N/A;  . SUBMUCOSAL TATTOO INJECTION  08/23/2018   Procedure: SUBMUCOSAL TATTOO INJECTION;  Surgeon: Irving Copas., MD;  Location: [place];  Service: Gastroenterology;;  . TEE WITHOUT CARDIOVERSION N/A 09/06/2017   Procedure: TRANSESOPHAGEAL ECHOCARDIOGRAM (TEE);  Surgeon: Prescott Gum, Collier Salina, MD;  Location: [place];  Service: Open Heart Surgery;  Laterality: N/A;  . WOUND DEBRIDEMENT N/A 08/30/2019   Procedure: Debridement Abdominal Wound;  Surgeon: Ivin Poot, MD;  Location: [facility];  Service: Thoracic;  Laterality: N/A;    Family History  Problem Relation Age of Onset  . Hypertension Father   . Colon cancer Neg Hx   . Esophageal cancer Neg Hx   . Inflammatory bowel disease Neg Hx   . Liver disease Neg Hx   . Pancreatic cancer Neg Hx   . Rectal cancer Neg Hx   . Stomach cancer Neg Hx     Social History:  reports that he has been smoking cigarettes. He has a 12.50 pack-year smoking history. He has never used smokeless tobacco. He reports current alcohol use. He reports that he does not use drugs.  Allergies: No Known Allergies  Medications: I have reviewed the patient's current medications.  Results for orders placed or performed during the hospital encounter of 08/16/19 (from the past 48 hour(s))   Heparin level (unfractionated)     Status: Abnormal   Collection Time: 09/03/19  8:01 PM  Result Value Ref Range   Heparin Unfractionated <0.10 (L) 0.30 - 0.70 IU/mL    Comment: (NOTE) If heparin results are below expected values, and patient dosage has  been confirmed, suggest follow up testing of antithrombin III levels. Performed at [facility], [place] [address], [place], [place] [address]   Cooxemetry Panel (carboxy, met, total hgb, O2 sat)     Status: Abnormal   Collection Time: 09/04/19  4:09 AM  Result Value Ref Range   Total hemoglobin 8.7 (L) 12.0 - 16.0 g/dL   O2 Saturation 67.0 %   Carboxyhemoglobin 2.9 (H) 0.5 - 1.5 %   Methemoglobin 1.1 0.0 - 1.5 %    Comment: Performed at [facility] [address] [place], [place] [address]  Lactate dehydrogenase     Status: None   Collection Time: 09/04/19  4:24 AM  Result Value Ref Range   LDH 172 98 - 192 U/L    Comment: Performed at [facility], [place] [address] [place], [place] [address]  Protime-INR  Status: Abnormal   Collection Time: 09/04/19  4:24 AM  Result Value Ref Range   Prothrombin Time 17.2 (H) 11.4 - 15.2 seconds   INR 1.4 (H) 0.8 - 1.2    Comment: (NOTE) INR goal varies based on device and disease states. Performed at [facility], [place] [address] [place], [place] [address]   Heparin level (unfractionated)     Status: Abnormal   Collection Time: 09/04/19  4:24 AM  Result Value Ref Range   Heparin Unfractionated <0.10 (L) 0.30 - 0.70 IU/mL    Comment: (NOTE) If heparin results are below expected values, and patient dosage has  been confirmed, suggest follow up testing of antithrombin III levels. Performed at [facility], [place] [address] [place], [place] [address]   Basic metabolic panel     Status: Abnormal   Collection Time: 09/04/19  4:24 AM  Result Value Ref Range   Sodium 128 (L) 135 - 145 mmol/L   Potassium 3.8 3.5 - 5.1 mmol/L   Chloride 85 (L) 98 - 111 mmol/L    CO2 28 22 - 32 mmol/L   Glucose, Bld 251 (H) 70 - 99 mg/dL    Comment: Glucose reference range applies only to samples taken after fasting for at least 8 hours.   BUN 47 (H) 6 - 20 mg/dL   Creatinine, Ser 1.81 (H) 0.61 - 1.24 mg/dL   Calcium 8.7 (L) 8.9 - 10.3 mg/dL   GFR calc non Af Amer 44 (L) >60 mL/min   GFR calc Af Amer 51 (L) >60 mL/min   Anion gap 15 5 - 15    Comment: Performed at [facility] [address] [place], [place] [address]  CBC     Status: Abnormal   Collection Time: 09/04/19  4:24 AM  Result Value Ref Range   WBC 19.3 (H) 4.0 - 10.5 K/uL   RBC 2.79 (L) 4.22 - 5.81 MIL/uL   Hemoglobin 8.1 (L) 13.0 - 17.0 g/dL   HCT 26.6 (L) 39.0 - 52.0 %   MCV 95.3 80.0 - 100.0 fL   MCH 29.0 26.0 - 34.0 pg   MCHC 30.5 30.0 - 36.0 g/dL   RDW 18.0 (H) 11.5 - 15.5 %   Platelets 250 150 - 400 K/uL   nRBC 0.1 0.0 - 0.2 %    Comment: Performed at [facility], [place] [address], [place], [place] [address]  Prepare RBC (crossmatch)     Status: None   Collection Time: 09/04/19  8:48 AM  Result Value Ref Range   Order Confirmation      ORDER PROCESSED BY BLOOD BANK Performed at [facility], [place] [address] [place], [place] [address]   Prepare RBC (crossmatch)     Status: None   Collection Time: 09/04/19  8:58 AM  Result Value Ref Range   Order Confirmation      ORDER PROCESSED BY BLOOD BANK Performed at [facility], [place] [address] [place], [place] [address]   Urinalysis, Routine w reflex microscopic     Status: None   Collection Time: 09/04/19 10:00 PM  Result Value Ref Range   Color, Urine YELLOW YELLOW   APPearance CLEAR CLEAR   Specific Gravity, Urine 1.016 1.005 - 1.030   pH 5.0 5.0 - 8.0   Glucose, UA NEGATIVE NEGATIVE mg/dL   Hgb urine dipstick NEGATIVE NEGATIVE   Bilirubin Urine NEGATIVE NEGATIVE   Ketones, ur NEGATIVE NEGATIVE mg/dL  Protein, ur NEGATIVE NEGATIVE mg/dL   Nitrite NEGATIVE NEGATIVE   Leukocytes,Ua NEGATIVE NEGATIVE     Comment: Performed at [facility], [place] [address] [place], [place] [address]  Cooxemetry Panel (carboxy, met, total hgb, O2 sat)     Status: Abnormal   Collection Time: 09/05/19  4:10 AM  Result Value Ref Range   Total hemoglobin 11.0 (L) 12.0 - 16.0 g/dL   O2 Saturation 59.8 %   Carboxyhemoglobin 2.5 (H) 0.5 - 1.5 %   Methemoglobin 1.0 0.0 - 1.5 %    Comment: Performed at [facility] [address] [place], [place] [address]  Lactate dehydrogenase     Status: None   Collection Time: 09/05/19  4:18 AM  Result Value Ref Range   LDH 149 98 - 192 U/L    Comment: Performed at [facility], [place] [address] [place], [place] [address]  Protime-INR     Status: Abnormal   Collection Time: 09/05/19  4:18 AM  Result Value Ref Range   Prothrombin Time 18.0 (H) 11.4 - 15.2 seconds   INR 1.5 (H) 0.8 - 1.2    Comment: (NOTE) INR goal varies based on device and disease states. Performed at [facility], [place] [address], [place], [place] [address]   Heparin level (unfractionated)     Status: Abnormal   Collection Time: 09/05/19  4:18 AM  Result Value Ref Range   Heparin Unfractionated <0.10 (L) 0.30 - 0.70 IU/mL    Comment: (NOTE) If heparin results are below expected values, and patient dosage has  been confirmed, suggest follow up testing of antithrombin III levels. Performed at [facility], [place] [address] [place], [place] [address]   Basic metabolic panel     Status: Abnormal   Collection Time: 09/05/19  4:18 AM  Result Value Ref Range   Sodium 134 (L) 135 - 145 mmol/L   Potassium 3.4 (L) 3.5 - 5.1 mmol/L   Chloride 95 (L) 98 - 111 mmol/L   CO2 29 22 - 32 mmol/L   Glucose, Bld 126 (H) 70 - 99 mg/dL    Comment: Glucose reference range applies only to samples taken after fasting for at least 8 hours.   BUN 43 (H) 6 - 20 mg/dL   Creatinine, Ser 1.53 (H) 0.61 - 1.24 mg/dL   Calcium 8.4 (L) 8.9 - 10.3 mg/dL   GFR calc non Af Amer 54 (L) >60 mL/min   GFR  calc Af Amer >60 >60 mL/min   Anion gap 10 5 - 15    Comment: Performed at [facility] [address] [place], [place] [address]  CBC     Status: Abnormal   Collection Time: 09/05/19  4:18 AM  Result Value Ref Range   WBC 11.9 (H) 4.0 - 10.5 K/uL   RBC 3.25 (L) 4.22 - 5.81 MIL/uL   Hemoglobin 9.4 (L) 13.0 - 17.0 g/dL   HCT 30.8 (L) 39.0 - 52.0 %   MCV 94.8 80.0 - 100.0 fL   MCH 28.9 26.0 - 34.0 pg   MCHC 30.5 30.0 - 36.0 g/dL   RDW 18.6 (H) 11.5 - 15.5 %   Platelets 219 150 - 400 K/uL   nRBC 0.0 0.0 - 0.2 %    Comment: Performed at [facility], [place] [address] [place], [place] [address]  Magnesium     Status: Abnormal   Collection Time: 09/05/19  4:18 AM  Result Value Ref Range   Magnesium 1.5 (L) 1.7 - 2.4 mg/dL    Comment: Performed at [facility] [address], [place], [place] [address]    No results found.  Review of Systems  Constitutional: Negative for chills and fever.  Respiratory: Negative for shortness of breath.   Cardiovascular: Negative for chest pain.  Gastrointestinal: Negative for nausea and vomiting.   Blood pressure 104/77, pulse 90, temperature 98.3 F (36.8 C), temperature source Axillary, resp. rate (!) 22, height 6' 2" (1.88 m), weight 97.5 kg, SpO2 93 %. Physical Exam  Constitutional: He is oriented to person, place, and time.  Resting in bed   HENT:  Head: Normocephalic and atraumatic.  Cardiovascular: Normal rate.  LVAD in place  Respiratory: Effort normal. No respiratory distress.  GI:  100cc of ss fluid in canister, good seal noted.   Neurological: He is alert and oriented to person, place, and time.  Skin: Skin is dry.  Psychiatric: He has a normal mood and affect.    Assessment/Plan:  Plan for debridement of abdominal wound - LVAD driveline tunnel and muscle flap for closure with Dr. Marla Roe today in conjunction with Dr. Prescott Gum.  Patient and I discussed planned procedure. The patients questions were  answered to the patients expressed satisfaction.  NPO.   Appreciate consult  Carola Rhine Jaylaa Gallion, PA-C 09/05/2019, 9:12 AM

## 2019-09-05 NOTE — Anesthesia Preprocedure Evaluation (Addendum)
Anesthesia Evaluation  Patient identified by MRN, date of birth, ID band Patient awake    Reviewed: Allergy & Precautions, H&P , NPO status , Patient's Chart, lab work & pertinent test results  Airway Mallampati: III  TM Distance: >3 FB Neck ROM: Full  Mouth opening: Limited Mouth Opening  Dental no notable dental hx. (+) Dental Advisory Given, Poor Dentition   Pulmonary asthma , sleep apnea , COPD, Current Smoker and Patient abstained from smoking.,    Pulmonary exam normal breath sounds clear to auscultation       Cardiovascular Exercise Tolerance: Good hypertension, Pt. on medications +CHF   Rhythm:Regular Rate:Normal  S/p LVAD. EF 10%   Neuro/Psych Anxiety negative neurological ROS     GI/Hepatic Neg liver ROS, GERD  Medicated and Controlled,  Endo/Other  negative endocrine ROS  Renal/GU negative Renal ROS  negative genitourinary   Musculoskeletal   Abdominal   Peds  Hematology  (+) Blood dyscrasia, anemia ,   Anesthesia Other Findings   Reproductive/Obstetrics negative OB ROS                            Anesthesia Physical  Anesthesia Plan  ASA: IV  Anesthesia Plan: General   Post-op Pain Management:    Induction: Intravenous  PONV Risk Score and Plan: 2 and Ondansetron, Dexamethasone and Midazolam  Airway Management Planned: Oral ETT  Additional Equipment: Arterial line  Intra-op Plan:   Post-operative Plan: Extubation in OR  Informed Consent: I have reviewed the patients History and Physical, chart, labs and discussed the procedure including the risks, benefits and alternatives for the proposed anesthesia with the patient or authorized representative who has indicated his/her understanding and acceptance.     Dental advisory given  Plan Discussed with: CRNA  Anesthesia Plan Comments:        Anesthesia Quick Evaluation

## 2019-09-05 NOTE — Anesthesia Procedure Notes (Signed)
Arterial Line Insertion Start/End4/21/2021 2:10 PM, 09/05/2019 2:15 PM Performed by: Marcene Duos, MD, anesthesiologist  Patient location: Pre-op. Preanesthetic checklist: patient identified, IV checked, site marked, risks and benefits discussed, surgical consent, monitors and equipment checked, pre-op evaluation, timeout performed and anesthesia consent Lidocaine 1% used for infiltration Left, radial was placed Catheter size: 20 Fr Hand hygiene performed  and maximum sterile barriers used   Attempts: 2 Procedure performed using ultrasound guided technique. Ultrasound Notes:anatomy identified, needle tip was noted to be adjacent to the nerve/plexus identified, no ultrasound evidence of intravascular and/or intraneural injection and image(s) printed for medical record Following insertion, dressing applied and Biopatch. Post procedure assessment: normal and unchanged  Patient tolerated the procedure well with no immediate complications.

## 2019-09-05 NOTE — Progress Notes (Signed)
Pre Procedure note for inpatients:   Jonathan Wood has been scheduled for Procedure(s): WOUND VAC CHANGE (N/A) Application Of A-Cell Of Chest/Abdomen today. The various methods of treatment have been discussed with the patient. After consideration of the risks, benefits and treatment options the patient has consented to the planned procedure.   The patient has been seen and labs reviewed. There are no changes in the patient's condition to prevent proceeding with the planned procedure today.  Recent labs:  Lab Results  Component Value Date   WBC 11.9 (H) 09/05/2019   HGB 9.4 (L) 09/05/2019   HCT 30.8 (L) 09/05/2019   PLT 219 09/05/2019   GLUCOSE 126 (H) 09/05/2019   CHOL 111 08/30/2017   TRIG 38 08/30/2017   HDL 33 (L) 08/30/2017   LDLCALC 70 08/30/2017   ALT 13 08/29/2019   AST 33 08/29/2019   NA 134 (L) 09/05/2019   K 3.4 (L) 09/05/2019   CL 95 (L) 09/05/2019   CREATININE 1.53 (H) 09/05/2019   BUN 43 (H) 09/05/2019   CO2 29 09/05/2019   TSH 0.209 (L) 08/13/2019   INR 1.5 (H) 09/05/2019   HGBA1C 5.2 08/30/2017    Mikey Bussing, MD 09/05/2019 8:40 AM

## 2019-09-05 NOTE — Progress Notes (Signed)
Patient via bed with LVAD coordinator Hessie Diener.  IV saline locked, report called to short stay

## 2019-09-05 NOTE — Op Note (Signed)
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 Pectoralis major turnover flap for coverage of abdominal wound 2. Placement of ACell powder 500 mg 3. Placement of VAC. 4. Excision of 12 cm skin  SURGEON: Shawnay Bramel Sanger Latriece Anstine, DO  CO-SURGEON:  Lovett Sox, MD  ASSISTANT: Keenan Bachelor, PA  EBL: 150 cc  CONDITION: Stable  COMPLICATIONS: None  INDICATION: The patient, Jonathan Wood, is a 49 y.o. male born on 1971-02-21, is here for treatment of an open abdominal wound after placement of a LVAD -HeartMate 3 implantation 2019.  He has had an infection in the area but has done well with recent antibiotic treatment  PROCEDURE DETAILS:  The patient was seen prior to surgery and marked.  The IV antibiotics were given. The patient was taken to the operating room and given a general anesthetic. A standard time out was performed and all information was confirmed by those in the room. SCDs were placed.   CT surgery and anesthesia prepared the patient.  Patient was prepped and draped with Betadine in the usual sterile fashion to include the chest and abdomen.  The wound was evaluated and it was clean.  It was 3 x 12 cm long and 4 cm deep he had exposure of the outflow graft been the right and proximal power cord at the base of the wound.  The wound was irrigated with antibiotic solution and saline.  The Misonix's debrider was used to debride the driveline and surrounding soft tissue.  The decision was made to go ahead with the left pectoralis major turnover flap.  The abdominal incision was followed superiorly in the previous scar incision site.  The Bovie was then used to dissect down to the superficial side of the pectoralis major muscle.  This was lifted to create a pocket and  raise the soft tissue of the left breast off the pectoralis muscle.  This was carried to all margins of the muscle to include inferior, lateral and superior margins.  The lateral margin was freed and I was able to put my hand underneath the pectoralis major muscle and free it from the pectoralis minor.  The vessels were located in the superior lateral axillary area and bovied very large vessels were proximally and distally.  The muscle was lifted continuously to the sternal margin laterally while preserving the perforating vessels.  The inferior border was then released as well as a portion of the superior border.  This was done in order to get the flap to reach the exposed Power cord. The 2-0 PDS was used to tack the muscle in place further dissection was done laterally on top of the rectus and the right pectoralis major muscle.  This provided better tension-free closure.  The skin edges were excised for the entire 12 cm abdominal wound.  The soft tissue was used to secure the pectoralis major muscle in place with the 2-0 PDS.  All of the ACell powder was used in the pocket area.  Additionally the thrombus that was placed at the origin of the pectoralis major muscle.  2 drains were placed.  One that went in the superior lateral direction and the other inferior medial direction.  They were secured to the chest wall with 3-0 silk.  The deep layers were closed with the 2-0 and 3-0 PDS.  The 3-0 and  4-0 Monocryl were then used.  The superior skin was closed with the 5-0 Monocryl.  The lower incision over the abdomen was closed with the 4-0 Monocryl.  The replicare was placed around the periwound area to protect the skin.  The VAC was applied over the incision.  The ABRA straps were placed at the abdominal area to help decrease tension at the incision site.       The patient was allowed to wake up and taken to recovery room in stable condition at the end of the case. The family was notified at the end of the case.    Dr.  Darcey Nora assisted throughout the case.  He was essential in retraction and counter traction when needed to make the case progress smoothly.  This retraction and assistance made it possible to see the tissue plans for the procedure.  The assistance was needed for blood control, tissue re-approximation and assisted with closure of the incision site.

## 2019-09-05 NOTE — Progress Notes (Signed)
Patient ID: Jonathan Wood, male   DOB: [birth year], 49 y.o.   MRN: [id number]     Advanced Heart Failure Rounding Note  PCP-Cardiologist: Arvilla Meres, MD    Patient Profile   49 y.o. 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  Subjective:    - 4/5 S/p I&D w/ excisional debridement of subxiphoid abscess. Cultures + Staph Aureus  - 4/7, Returned to OR for debridement, Irrigation and Packing of Abdominal Incision - 4/9 S/P I&D abd wound with Acell and VAC application.  - 4/15 s/p I&D wound with VAC application.  - 4/19 S/P I&D with VAC application.   Switched to cefepime yesterday for WBC 20k. Remains AF. WBC down to 11.9. Lasix stopped and given some IVF yesterday for AKI. Creatinine improved CVP 12-14. Denies SOB, orthopnea or PND. Ab pain improved.   For pec flap in OR today. On heparin. No bleeding  HM3 VAD Interrogation: Speed:5600 Flow:5.3 Power:4.0   PI:2.9  VAD interrogated personally. Parameters stable.  Objective:   Weight Range: 97.5 kg Body mass index is 27.6 kg/m.   Vital Signs:   Temp:  [97.6 F (36.4 C)-98.2 F (36.8 C)] 98.1 F (36.7 C) (04/21 0815) Pulse Rate:  [84-94] 90 (04/21 0815) Resp:  [18-28] 22 (04/21 0815) BP: (76-106)/(48-81) 104/77 (04/21 0815) SpO2:  [93 %-100 %] 93 % (04/21 0815) Weight:  [97.5 kg] 97.5 kg (04/21 0337) Last BM Date: 09/04/19  Weight change: Filed Weights   09/03/19 0500 09/04/19 0258 09/05/19 0337  Weight: 96.9 kg 96.8 kg 97.5 kg    Intake/Output:   Intake/Output Summary (Last 24 hours) at 09/05/2019 0839 Last data filed at 09/05/2019 0600 Gross per 24 hour  Intake 615.51 ml  Output 1970 ml  Net -1354.49 ml    Telemetry: NSR 80-90s Personally reviewed   Physical Exam   General:  NAD.  HEENT: normal  Neck: supple. JVP 12 Carotids 2+ bilat; no  bruits. No lymphadenopathy or thryomegaly appreciated. Cor: LVAD hum.  Lungs: Clear. Abdomen: wound vac in place. Good seal obese soft, nontender, non-distended. No hepatosplenomegaly. No bruits or masses. Good bowel sounds. Driveline site clean. Anchor in place.  Extremities: no cyanosis, clubbing, rash. Warm no edema  Neuro: alert & oriented x 3. No focal deficits. Moves all 4 without problem    Labs    CBC Recent Labs    09/03/19 0500 09/03/19 0500 09/04/19 0424 09/05/19 0418  WBC 14.8*   < > 19.3* 11.9*  NEUTROABS 10.3*  --   --   --   HGB 8.7*   < > 8.1* 9.4*  HCT 28.7*   < > 26.6* 30.8*  MCV 96.0   < > 95.3 94.8  PLT 248   < > 250 219   < > = values in this interval not displayed.   Basic Metabolic Panel Recent Labs    50/53/97 0500 09/03/19 0500 09/04/19 0424 09/05/19 0418  NA 132*   < > 128* 134*  K 3.6   < > 3.8 3.4*  CL 88*   < > 85* 95*  CO2 31   < > 28 29  GLUCOSE 124*   < > 251* 126*  BUN 39*   < > 47* 43*  CREATININE 1.26*   < > 1.81* 1.53*  CALCIUM 9.1   < > 8.7* 8.4*  MG  1.6*  --   --  1.5*   < > = values in this interval not displayed.   Liver Function Tests No results for input(s): AST, ALT, ALKPHOS, BILITOT, PROT, ALBUMIN in the last 72 hours. No results for input(s): LIPASE, AMYLASE in the last 72 hours. Cardiac Enzymes No results for input(s): CKTOTAL, CKMB, CKMBINDEX, TROPONINI in the last 72 hours.  BNP: BNP (last 3 results) Recent Labs    03/21/19 1415 04/23/19 1350  BNP 426.9* 709.6*    ProBNP (last 3 results) No results for input(s): PROBNP in the last 8760 hours.   D-Dimer No results for input(s): DDIMER in the last 72 hours. Hemoglobin A1C No results for input(s): HGBA1C in the last 72 hours. Fasting Lipid Panel No results for input(s): CHOL, HDL, LDLCALC, TRIG, CHOLHDL, LDLDIRECT in the last 72 hours. Thyroid Function Tests No results for input(s): TSH, T4TOTAL, T3FREE, THYROIDAB in the last 72 hours.  Invalid  input(s): FREET3  Other results:   Imaging    No results found.   Medications:     Scheduled Medications: . amiodarone  200 mg Oral BID  . vitamin C  250 mg Oral Daily  . bisacodyl  10 mg Oral Daily  . Chlorhexidine Gluconate Cloth  6 each Topical Daily  . docusate sodium  100 mg Oral TID  . DULoxetine  30 mg Oral Daily  . feeding supplement (ENSURE ENLIVE)  237 mL Oral TID BM  . feeding supplement (PRO-STAT SUGAR FREE 64)  30 mL Oral BID  . ferrous sulfate  325 mg Oral Q breakfast  . gabapentin  300 mg Oral TID  . hydrALAZINE  75 mg Oral TID  . linaclotide  145 mcg Oral Daily  . mouth rinse  15 mL Mouth Rinse BID  . multivitamin with minerals  1 tablet Oral Daily  . pantoprazole  40 mg Oral BID  . rifabutin  300 mg Oral Daily  . senna-docusate  1 tablet Oral QHS  . sildenafil  20 mg Oral TID  . sodium chloride flush  10-40 mL Intracatheter Q12H  . sodium chloride flush  3 mL Intravenous Q12H  . spironolactone  12.5 mg Oral Daily  . Warfarin - Pharmacist Dosing Inpatient   Does not apply q1600  . zinc sulfate  220 mg Oral Daily    Infusions: . sodium chloride 10 mL/hr at 08/30/19 1000  . ceFEPime (MAXIPIME) IV 2 g (09/04/19 2211)  . dextrose 5 % and 0.45% NaCl Stopped (08/25/19 1414)  . lactated ringers    . phenylephrine (NEO-SYNEPHRINE) Adult infusion      PRN Medications: sodium chloride, acetaminophen, albuterol, alum & mag hydroxide-simeth, bisacodyl **OR** bisacodyl, guaiFENesin-dextromethorphan, hydrOXYzine, magnesium hydroxide, ondansetron (ZOFRAN) IV, oxyCODONE, sodium chloride flush, sodium chloride flush, traMADol    Assessment/Plan    1. Fever & leukocytosis -> MSSA bacteremia/ Subxiphoid Abscess  - bcx 2/2 MSSA - ID has changed antibiotics to cefazolin, now has PICC.   - WBC trending down.  Now 12.4.  Afebrile. - He is not a candidate for VAD exchange due to severe RV failure and noncompliance.  - S/p I&D on 4/5. Wound cultures +MSSA.  - Back  to OR 4/7, 4/9, 4/15 , 4/19 debridement and irrigation of driveline + packing, wound vac in place.    - ID recommends 6 weeks of IV cefazolin followed by long-term suppressive oral cephalexin. Switched to cefepime on 4/19  He has PICC line. Remains afebrile - Planning possible reconstructive surgery with pec  flap this afternoon - INR 1.5 Off warfarin - Continue low-dose heparin Discussed dosing with PharmD personally. 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  S/p HM-3 LVAD on 09/06/17 - On sildenafil for RV failure.  - CO-OX 60% - CVP 12 Has been diuresed 27 pounds. Creatinine bumped yesterday. Now off IV diuretics. May need to restart after OR - Continue spiro - Continue current dose of hydralazine and sildenafil. No imdur with sildenafil.    3. HM3 LVAD 09/06/2017. - VAD interrogated personally. Parameters stable. - LDH 149 today  - INR 1.5 on low-dose heparin. Management as above - Off ASA with PUD.    4. Essential HTN - MAP stable 70-80s - Continue hydralazine 75 mg tid. - Continue sildenafil 20 mg tid     5. AKI - Creatinine improved with IVF yesterday  6. OSA - sleep study with very severe OSA (AHI 69/hr) - non-compliant Bipap in the past, has refused to use.  - He continues to tell respiratory that he will wear the machine himself.   -He is only using intermittently.   7. H/o GI bleed  - 08/2018 EGD completed and showed gastritis and nonbleeding duodenal ulcers, colonoscopy with 5 polyps removed. Protonix was increased to BID.  - No recent GI bleeding - hgb 9.4 today, stable.  - Transfuse hgb < 7.  - He will stay off aspirin for now.   8. Tobacco Abuse  - says he quit recently  66. Severe protein calorie malnutrition  - Albumin 2.1 on 4/14 - nutrition seeing   10 Hypokalemia -K 3.4  - Supp K   12. Hypo magnesium - supp with 4g IV  Length of Stay: [place], MD  09/05/2019, 8:39  AM  Advanced Heart Failure Team Pager [phone] (M-F; 7a - 4p)  Please contact [facility] for night-coverage after hours (4p -7a ) and weekends on [email or web address] 8:39 AM

## 2019-09-05 NOTE — Progress Notes (Signed)
Patient refused CPAP for HS use at this time. States he is in too much pain to wear at this time.  Patient remains on 5L [place].  Equipment set up at bedside if patient changes his mind. Patient aware to call for assistance.

## 2019-09-05 NOTE — Interval H&P Note (Signed)
History and Physical Interval Note:  09/05/2019 11:48 AM  Jonathan Wood  has presented today for surgery, with the diagnosis of VAD ABDOMINAL WOUND.  The various methods of treatment have been discussed with the patient and family. After consideration of risks, benefits and other options for treatment, the patient has consented to  Procedure(s): DEBRIDEMENT AND CLOSURE OF ABDOMINAL WOUND (N/A) MUSCLE FLAP CLOSURE (N/A) as a surgical intervention.  The patient's history has been reviewed, patient examined, no change in status, stable for surgery.  I have reviewed the patient's chart and labs.  Questions were answered to the patient's satisfaction.     [name] [name]

## 2019-09-05 NOTE — Progress Notes (Signed)
ANTICOAGULATION CONSULT NOTE - Follow Up Consult  Pharmacy Consult for warfarin (hold) + heparin Indication: LVAD  No Known Allergies  Patient Measurements: Height: 6\' 2"  (188 cm) Weight: 97.5 kg (214 lb 15.2 oz) IBW/kg (Calculated) : 82.2  Vital Signs: Temp: 98.3 F (36.8 C) (04/21 0815) Temp Source: Axillary (04/21 0815) BP: 104/77 (04/21 0815) Pulse Rate: 90 (04/21 0815)  Labs: Recent Labs    09/03/19 0500 09/03/19 0500 09/03/19 2001 09/04/19 0424 09/05/19 0418  HGB 8.7*   < >  --  8.1* 9.4*  HCT 28.7*  --   --  26.6* 30.8*  PLT 248  --   --  250 219  LABPROT 17.6*  --   --  17.2* 18.0*  INR 1.5*  --   --  1.4* 1.5*  HEPARINUNFRC <0.10*   < > <0.10* <0.10* <0.10*  CREATININE 1.26*  --   --  1.81* 1.53*   < > = values in this interval not displayed.    Estimated Creatinine Clearance: 70.1 mL/min (A) (by C-G formula based on SCr of 1.53 mg/dL (H)).   Medical History: Past Medical History:  Diagnosis Date  . Asthma   . CHF (congestive heart failure) (HCC)    a. 09/2016: EF 20-25% with cath showing normal cors  . GERD (gastroesophageal reflux disease)   . History of hiatal hernia   . OSA on CPAP 09/06/2018   Severe OSA with AHI 68/hr on CPAP at 12cm H2O    Assessment: 48yom with HF s/p LVAD implant HM3 place 4/19 admitted with drive line infection. S/p OR on 4/15 for repeat I&D with application of wound vac.   Warfarin PTA 7.5mg  MW and 5mg  all other days admit INR elevated at 5 - probably acute infection related supra-sensitive.   INR 1.5 - holding warfarin. After discussion with PVT on 4/19, ok to start low-dose heparin and keep INR 1.8-2.2 with OR planned for 4/21.  Heparin level undetectable this morning as expected. No rate change indicated.   Goal of Therapy:  INR 2-2.5 Monitor platelets by anticoagulation protocol: Yes   Plan:  -Pause heparin per MD in anticipation to go to OR today -Continue to hold warfarin -Follow up after surgery for timing  of restarting heparin -Daily INR, CBC, heparin level  5/19, PharmD, [facility] PGY2 Cardiology Pharmacy Resident Phone [phone] 09/05/2019       8:53 AM  Please check [email or web address] for unit-specific pharmacist phone numbers

## 2019-09-05 NOTE — Anesthesia Postprocedure Evaluation (Signed)
Anesthesia Post Note  Patient: Jonathan Wood  Procedure(s) Performed: DEBRIDEMENT AND CLOSURE OF ABDOMINAL WOUND (N/A Abdomen) MUSCLE FLAP CLOSURE (N/A Abdomen) Application Of A-Cell Of Chest/Abdomen (N/A Abdomen)     Patient location during evaluation: PACU Anesthesia Type: General Level of consciousness: awake and alert Pain management: pain level controlled Vital Signs Assessment: post-procedure vital signs reviewed and stable Respiratory status: spontaneous breathing, nonlabored ventilation, respiratory function stable and patient connected to nasal cannula oxygen Cardiovascular status: blood pressure returned to baseline and stable Postop Assessment: no apparent nausea or vomiting Anesthetic complications: no    Last Vitals:  Vitals:   09/05/19 1727 09/05/19 1730  BP:  (!) 128/108  Pulse:  92  Resp: 16 18  Temp:  36.7 C  SpO2: 100% 96%    Last Pain:  Vitals:   09/05/19 1800  TempSrc:   PainSc: 8                  Kennieth Rad

## 2019-09-05 NOTE — Progress Notes (Signed)
LVAD Coordinator Rounding Note:  Admitted 08/16/19 by Dr. [name] due to VAD drive line infection/pneumonia/sepsis  HM III LVAD implanted on 09/06/17 by Dr. Cyndia Bent under Destination Therapy criteria.  Pt lying in bed, asleep. Arouses briefly, says he is "ready" for OR today.    Vital signs: Temp: 98.3 HR: 99 Doppler Pressure: 88 Automatic BP:  104/77 (86) Sat: 97% on RA Wt: 227.5>231>235>233.9>236.3.....216>220>219>216>213>213>214.9 lbs   LVAD interrogation reveals:  Speed: 5600 Flow: 5.1 Power:  4.2w PI: 2.7 Alarms: none Events: none Hematocrit: 31  Fixed speed: 5600 Low speed limit: 5300  Drive Line:  Left abdominal drive line dressing C/D/I with anchor unattached. Anchor replaced. Weekly dressing changes per BS nurse.   Sternal wound: OR today for muscle flap per Dr. Darcey Nora and Dr. Marla Roe.   Labs:  LDH trend: 227>230>271........208>187>161>175>168>172>149  INR trend: 4.5>5.0>1.7>1.7>1.7>1.8>1.7>2.1>3.4>2.5>2.7>2.1>1.4>1.5>1.4>1.5   WBC: 27.3>26.2>24>34.6>25.8>20.8......13.0>12.4>14.8>19.3>11.9  Alk Phos: 138> 177>123  AST: 53> 154>33  ALT: 17> 35>13  Infection: - blood cultures 08/16/19>>staphylococcus aureus - blood cultures 08/18/19>> NGTD - Drive line tunnel / sternum 08/20/19>> abundant WBC, gram + cocci, culture- few staph aureus - Drive line AFB 9/3/552>> pending - Drive line/Sternal Fungus culture 08/20/19>> negative - blood cultures 09/02/19>>negative/fina. - body fluid culture 08/28/19>> negative/final  Anticoagulation Plan: -INR Goal: 2.0 - 2.5  -ASA Dose: none  Adverse Events: - driveline cx 17/4/71>> staph aureus - Admitted for sternal/drive line infection. OR for debridement 08/20/19- + staph aureus wound cultures; further irrigation/debridements done 08/22/19, 08/24/19, 08/30/19   Plan/Recommendations:  1. Call VAD pager if any VAD equipment or drive line issues. 2. Weekly dressing changes by bedside nurse.  3. Planned muscle flap per Dr.  Darcey Nora and Dr. Oletha Blend today at 1300. VAD Coordinator will accompany and remain with patient in OR.   Zada Girt RN [place] Coordinator  Office: [phone]  24/7 Pager: [phone]

## 2019-09-05 NOTE — Anesthesia Procedure Notes (Signed)
Procedure Name: Intubation Date/Time: 09/05/2019 2:04 PM Performed by: Izola Price., CRNA Pre-anesthesia Checklist: Patient identified, Emergency Drugs available, Suction available and Patient being monitored Patient Re-evaluated:Patient Re-evaluated prior to induction Oxygen Delivery Method: Circle system utilized Preoxygenation: Pre-oxygenation with 100% oxygen Induction Type: IV induction Ventilation: Mask ventilation without difficulty Laryngoscope Size: Glidescope and 4 Grade View: Grade I Tube type: Oral Tube size: 7.5 mm Number of attempts: 1 Airway Equipment and Method: Stylet and Oral airway Placement Confirmation: ETT inserted through vocal cords under direct vision,  positive ETCO2 and breath sounds checked- equal and bilateral Tube secured with: Tape Dental Injury: Teeth and Oropharynx as per pre-operative assessment  Comments: Placed by Baldemar Lenis

## 2019-09-05 NOTE — Consult Note (Signed)
[facility]  Reason for Consult: LVAD driveline infection Referring Physician: Dr. Tharon Aquas Trigt  Jonathan Wood is an 49 y.o. male.  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  Patient presented to ED on 08/16/2019 after being evaluated at the LVAD clinic.  At clinic visit patient was noted to have elevated white count of 27K.  Blood culture on 08/16/2019 noted to have staph aureus, MSSA.  Infectious disease consulted for management.  Patient underwent incision and drainage of LVAD tunnel abscess and substernal abdominal wall on 08/20/2019 by Dr. Prescott Gum.  He underwent multiple more debridements, placement of ACell, placement of wound VAC during his current hospital stay.  Last procedure was on 09/03/2019: Irrigation of abdominal wound, LVAD driveline tunnel, application of ACell, application of wound VAC by Dr. Prescott Gum.  Plastic surgery consulted for assistance with muscle flap and closure.  Jonathan Wood reports that he is feeling fine today, no fevers, chills, nausea, vomiting, chest pain, shortness of breath.  He reports that his mouth is dry.  No other complaints.   Past Medical History:  Diagnosis Date  . Asthma   . CHF (congestive heart failure) ([place])    a. 09/2016: EF 20-25% with cath showing normal cors  . GERD (gastroesophageal reflux disease)   . History of hiatal hernia   . OSA on CPAP 09/06/2018   Severe OSA with AHI 68/hr on CPAP at 12cm H2O    Past Surgical History:  Procedure Laterality Date  . APPLICATION OF A-CELL OF CHEST/ABDOMEN N/A 08/24/2019   Procedure: Application Of A-Cell Of Chest/Abdomen;  Surgeon: Ivin Poot, MD;  Location: [place];  Service: Thoracic;  Laterality: N/A;  . APPLICATION OF A-CELL OF CHEST/ABDOMEN  09/03/2019   Procedure: Application Of A-Cell Of Chest/Abdomen;  Surgeon:  Ivin Poot, MD;  Location: [place];  Service: Thoracic;;  . APPLICATION OF WOUND VAC N/A 08/22/2019   Procedure: Debridement, Irrigation and Packing of Abdominal Incision.;  Surgeon: Ivin Poot, MD;  Location: [place];  Service: Thoracic;  Laterality: N/A;  . APPLICATION OF WOUND VAC N/A 08/24/2019   Procedure: Irrigation and Debridement with WOUND VAC APPLICATION;  Surgeon: Ivin Poot, MD;  Location: [place];  Service: Thoracic;  Laterality: N/A;  . APPLICATION OF WOUND VAC N/A 08/30/2019   Procedure: APPLICATION OF WOUND VAC;  Surgeon: Ivin Poot, MD;  Location: [place];  Service: Thoracic;  Laterality: N/A;  . APPLICATION OF WOUND VAC N/A 09/03/2019   Procedure: WOUND VAC CHANGE;  Surgeon: Ivin Poot, MD;  Location: [place];  Service: Thoracic;  Laterality: N/A;  . BIOPSY  08/23/2018   Procedure: BIOPSY;  Surgeon: Irving Copas., MD;  Location: [place];  Service: Gastroenterology;;  . COLONOSCOPY N/A 08/23/2018   Procedure: COLONOSCOPY;  Surgeon: Irving Copas., MD;  Location: [place];  Service: Gastroenterology;  Laterality: N/A;  . ENTEROSCOPY N/A 08/23/2018   Procedure: ENTEROSCOPY;  Surgeon: Rush Landmark Telford Nab., MD;  Location: [place];  Service: Gastroenterology;  Laterality: N/A;  . HEMOSTASIS CLIP PLACEMENT  08/23/2018   Procedure: HEMOSTASIS CLIP PLACEMENT;  Surgeon: Irving Copas., MD;  Location: [place];  Service: Gastroenterology;;  . IABP INSERTION N/A 09/05/2017   Procedure: IABP INSERTION;  Surgeon: Jolaine Artist, MD;  Location: [place] [facility];  Service: Cardiovascular;  Laterality: N/A;  . INSERTION OF IMPLANTABLE LEFT VENTRICULAR ASSIST DEVICE N/A 09/06/2017   Procedure: INSERTION OF IMPLANTABLE LEFT VENTRICULAR ASSIST DEVICE - HM3;  Surgeon: Van Trigt, Peter, MD;  [facility];  Service: Open Heart Surgery;  Laterality: N/A;  HM3  . NASAL FRACTURE SURGERY  1987  . POLYPECTOMY  08/23/2018   Procedure:  POLYPECTOMY;  Surgeon: Mansouraty, Gabriel Jr., MD;  [facility];  Service: Gastroenterology;;  . RIGHT HEART CATH N/A 08/30/2017   Procedure: RIGHT HEART CATH;  Surgeon: Bensimhon, Daniel R, MD;  [facility];  Service: Cardiovascular;  Laterality: N/A;  . RIGHT/LEFT HEART CATH AND CORONARY ANGIOGRAPHY N/A 10/08/2016   Procedure: Right/Left Heart Cath and Coronary Angiography;  Surgeon: Kadakia, Ajay, MD;  [facility];  Service: Cardiovascular;  Laterality: N/A;  . STERNAL WOUND DEBRIDEMENT N/A 08/20/2019   Procedure: DEBRIDEMENT OF LVAD DRIVELINE TUNNEL;  Surgeon: Van Trigt, Peter, MD;  [facility];  Service: Thoracic;  Laterality: N/A;  . SUBMUCOSAL TATTOO INJECTION  08/23/2018   Procedure: SUBMUCOSAL TATTOO INJECTION;  Surgeon: Mansouraty, Gabriel Jr., MD;  [facility];  Service: Gastroenterology;;  . TEE WITHOUT CARDIOVERSION N/A 09/06/2017   Procedure: TRANSESOPHAGEAL ECHOCARDIOGRAM (TEE);  Surgeon: Van Trigt, Peter, MD;  [facility];  Service: Open Heart Surgery;  Laterality: N/A;  . WOUND DEBRIDEMENT N/A 08/30/2019   Procedure: Debridement Abdominal Wound;  Surgeon: Van Trigt, Peter, MD;  [facility];  Service: Thoracic;  Laterality: N/A;    Family History  Problem Relation Age of Onset  . Hypertension Father   . Colon cancer Neg Hx   . Esophageal cancer Neg Hx   . Inflammatory bowel disease Neg Hx   . Liver disease Neg Hx   . Pancreatic cancer Neg Hx   . Rectal cancer Neg Hx   . Stomach cancer Neg Hx     Social History:  reports that he has been smoking cigarettes. He has a 12.50 pack-year smoking history. He has never used smokeless tobacco. He reports current alcohol use. He reports that he does not use drugs.  Allergies: No Known Allergies  Medications: I have reviewed the patient's current medications.  Results for orders placed or performed during the hospital encounter of 08/16/19 (from the past 48 hour(s))   Heparin level (unfractionated)     Status: Abnormal   Collection Time: 09/03/19  8:01 PM  Result Value Ref Range   Heparin Unfractionated <0.10 (L) 0.30 - 0.70 IU/mL    Comment: (NOTE) If heparin results are below expected values, and patient dosage has  been confirmed, suggest follow up testing of antithrombin III levels. Performed at [facility], [address] [place], [place] [address]   Cooxemetry Panel (carboxy, met, total hgb, O2 sat)     Status: Abnormal   Collection Time: 09/04/19  4:09 AM  Result Value Ref Range   Total hemoglobin 8.7 (L) 12.0 - 16.0 g/dL   O2 Saturation 67.0 %   Carboxyhemoglobin 2.9 (H) 0.5 - 1.5 %   Methemoglobin 1.1 0.0 - 1.5 %    Comment: Performed at [facility], [address] [place], [place] [address]  Lactate dehydrogenase     Status: None   Collection Time: 09/04/19  4:24 AM  Result Value Ref Range   LDH 172 98 - 192 U/L    Comment: Performed at [facility], [address] [place], [place] [address]  Protime-INR       Status: Abnormal   Collection Time: 09/04/19  4:24 AM  Result Value Ref Range   Prothrombin Time 17.2 (H) 11.4 - 15.2 seconds   INR 1.4 (H) 0.8 - 1.2    Comment: (NOTE) INR goal varies based on device and disease states. Performed at [facility], [address] [place], [place] [address]   Heparin level (unfractionated)     Status: Abnormal   Collection Time: 09/04/19  4:24 AM  Result Value Ref Range   Heparin Unfractionated <0.10 (L) 0.30 - 0.70 IU/mL    Comment: (NOTE) If heparin results are below expected values, and patient dosage has  been confirmed, suggest follow up testing of antithrombin III levels. Performed at [facility], [address] [place], [place] [address]   Basic metabolic panel     Status: Abnormal   Collection Time: 09/04/19  4:24 AM  Result Value Ref Range   Sodium 128 (L) 135 - 145 mmol/L   Potassium 3.8 3.5 - 5.1 mmol/L   Chloride 85 (L) 98 - 111 mmol/L    CO2 28 22 - 32 mmol/L   Glucose, Bld 251 (H) 70 - 99 mg/dL    Comment: Glucose reference range applies only to samples taken after fasting for at least 8 hours.   BUN 47 (H) 6 - 20 mg/dL   Creatinine, Ser 1.81 (H) 0.61 - 1.24 mg/dL   Calcium 8.7 (L) 8.9 - 10.3 mg/dL   GFR calc non Af Amer 44 (L) >60 mL/min   GFR calc Af Amer 51 (L) >60 mL/min   Anion gap 15 5 - 15    Comment: Performed at [facility], [address] [place], [place] [address]  CBC     Status: Abnormal   Collection Time: 09/04/19  4:24 AM  Result Value Ref Range   WBC 19.3 (H) 4.0 - 10.5 K/uL   RBC 2.79 (L) 4.22 - 5.81 MIL/uL   Hemoglobin 8.1 (L) 13.0 - 17.0 g/dL   HCT 26.6 (L) 39.0 - 52.0 %   MCV 95.3 80.0 - 100.0 fL   MCH 29.0 26.0 - 34.0 pg   MCHC 30.5 30.0 - 36.0 g/dL   RDW 18.0 (H) 11.5 - 15.5 %   Platelets 250 150 - 400 K/uL   nRBC 0.1 0.0 - 0.2 %    Comment: Performed at [facility], [address] [place], [place] [address]  Prepare RBC (crossmatch)     Status: None   Collection Time: 09/04/19  8:48 AM  Result Value Ref Range   Order Confirmation      ORDER PROCESSED BY BLOOD BANK Performed at [facility], [address] [place], [place] [address]   Prepare RBC (crossmatch)     Status: None   Collection Time: 09/04/19  8:58 AM  Result Value Ref Range   Order Confirmation      ORDER PROCESSED BY BLOOD BANK Performed at [facility], [address] , [place] [address]   Urinalysis, Routine w reflex microscopic     Status: None   Collection Time: 09/04/19 10:00 PM  Result Value Ref Range   Color, Urine YELLOW YELLOW   APPearance CLEAR CLEAR   Specific Gravity, Urine 1.016 1.005 - 1.030   pH 5.0 5.0 - 8.0   Glucose, UA NEGATIVE NEGATIVE mg/dL   Hgb urine dipstick NEGATIVE NEGATIVE   Bilirubin Urine NEGATIVE NEGATIVE   Ketones, ur NEGATIVE NEGATIVE mg/dL     Protein, ur NEGATIVE NEGATIVE mg/dL   Nitrite NEGATIVE NEGATIVE   Leukocytes,Ua NEGATIVE NEGATIVE     Comment: Performed at [facility], [address] [place], [place] [address]  Cooxemetry Panel (carboxy, met, total hgb, O2 sat)     Status: Abnormal   Collection Time: 09/05/19  4:10 AM  Result Value Ref Range   Total hemoglobin 11.0 (L) 12.0 - 16.0 g/dL   O2 Saturation 59.8 %   Carboxyhemoglobin 2.5 (H) 0.5 - 1.5 %   Methemoglobin 1.0 0.0 - 1.5 %    Comment: Performed at [facility], [address] [place], [place] [address]  Lactate dehydrogenase     Status: None   Collection Time: 09/05/19  4:18 AM  Result Value Ref Range   LDH 149 98 - 192 U/L    Comment: Performed at [facility], [address] [place], [place] [address]  Protime-INR     Status: Abnormal   Collection Time: 09/05/19  4:18 AM  Result Value Ref Range   Prothrombin Time 18.0 (H) 11.4 - 15.2 seconds   INR 1.5 (H) 0.8 - 1.2    Comment: (NOTE) INR goal varies based on device and disease states. Performed at [facility], [address] [place], [place] [address]   Heparin level (unfractionated)     Status: Abnormal   Collection Time: 09/05/19  4:18 AM  Result Value Ref Range   Heparin Unfractionated <0.10 (L) 0.30 - 0.70 IU/mL    Comment: (NOTE) If heparin results are below expected values, and patient dosage has  been confirmed, suggest follow up testing of antithrombin III levels. Performed at [facility], [address] [place], [place] [address]   Basic metabolic panel     Status: Abnormal   Collection Time: 09/05/19  4:18 AM  Result Value Ref Range   Sodium 134 (L) 135 - 145 mmol/L   Potassium 3.4 (L) 3.5 - 5.1 mmol/L   Chloride 95 (L) 98 - 111 mmol/L   CO2 29 22 - 32 mmol/L   Glucose, Bld 126 (H) 70 - 99 mg/dL    Comment: Glucose reference range applies only to samples taken after fasting for at least 8 hours.   BUN 43 (H) 6 - 20 mg/dL   Creatinine, Ser 1.53 (H) 0.61 - 1.24 mg/dL   Calcium 8.4 (L) 8.9 - 10.3 mg/dL   GFR calc non Af Amer 54 (L) >60 mL/min   GFR  calc Af Amer >60 >60 mL/min   Anion gap 10 5 - 15    Comment: Performed at [facility], [address] [place], [place] [address]  CBC     Status: Abnormal   Collection Time: 09/05/19  4:18 AM  Result Value Ref Range   WBC 11.9 (H) 4.0 - 10.5 K/uL   RBC 3.25 (L) 4.22 - 5.81 MIL/uL   Hemoglobin 9.4 (L) 13.0 - 17.0 g/dL   HCT 30.8 (L) 39.0 - 52.0 %   MCV 94.8 80.0 - 100.0 fL   MCH 28.9 26.0 - 34.0 pg   MCHC 30.5 30.0 - 36.0 g/dL   RDW 18.6 (H) 11.5 - 15.5 %   Platelets 219 150 - 400 K/uL   nRBC 0.0 0.0 - 0.2 %    Comment: Performed at [facility], [address] , [place] [address]  Magnesium     Status: Abnormal   Collection Time: 09/05/19  4:18 AM    Result Value Ref Range   Magnesium 1.5 (L) 1.7 - 2.4 mg/dL    Comment: Performed at [facility] [address], [place], [place] [address]    No results found.  Review of Systems  Constitutional: Negative for chills and fever.  Respiratory: Negative for shortness of breath.   Cardiovascular: Negative for chest pain.  Gastrointestinal: Negative for nausea and vomiting.   Blood pressure 104/77, pulse 90, temperature 98.3 F (36.8 C), temperature source Axillary, resp. rate (!) 22, height 6' 2" (1.88 m), weight 97.5 kg, SpO2 93 %. Physical Exam  Constitutional: He is oriented to person, place, and time.  Resting in bed   HENT:  Head: Normocephalic and atraumatic.  Cardiovascular: Normal rate.  LVAD in place  Respiratory: Effort normal. No respiratory distress.  GI:  100cc of ss fluid in canister, good seal noted.   Neurological: He is alert and oriented to person, place, and time.  Skin: Skin is dry.  Psychiatric: He has a normal mood and affect.    Assessment/Plan:  Plan for debridement of abdominal wound - LVAD driveline tunnel and muscle flap for closure with Dr. Marla Roe today in conjunction with Dr. Prescott Gum.  Patient and I discussed planned procedure. The patients questions were  answered to the patients expressed satisfaction.  NPO.   Appreciate consult  Carola Rhine Leander Tout, PA-C 09/05/2019, 9:12 AM

## 2019-09-05 NOTE — Transfer of Care (Signed)
Immediate Anesthesia Transfer of Care Note  Patient: Jonathan Wood  Procedure(s) Performed: DEBRIDEMENT AND CLOSURE OF ABDOMINAL WOUND (N/A Abdomen) MUSCLE FLAP CLOSURE (N/A Abdomen) Application Of A-Cell Of Chest/Abdomen (N/A Abdomen)  Patient Location: PACU  Anesthesia Type:General  Level of Consciousness: awake, alert  and oriented  Airway & Oxygen Therapy: Patient Spontanous Breathing and Patient connected to face mask oxygen  Post-op Assessment: Report given to RN and Post -op Vital signs reviewed and stable  Post vital signs: Reviewed and stable  Last Vitals:  Vitals Value Taken Time  BP 126/110 09/05/19 1714  Temp    Pulse 99 09/05/19 1720  Resp 19 09/05/19 1720  SpO2 100 % 09/05/19 1720  Vitals shown include unvalidated device data.  Last Pain:  Vitals:   09/05/19 1200  TempSrc: Oral  PainSc: 3       Patients Stated Pain Goal: 2 (09/02/19 1346)  Complications: No apparent anesthesia complications

## 2019-09-05 NOTE — Progress Notes (Signed)
Patient refusing CPAP at this time

## 2019-09-06 LAB — PREPARE FRESH FROZEN PLASMA
Unit division: 0
Unit division: 0
Unit division: 0
Unit division: 0

## 2019-09-06 LAB — BPAM FFP
Blood Product Expiration Date: 202104262359
Blood Product Expiration Date: 202104262359
Blood Product Expiration Date: 202104262359
Blood Product Expiration Date: 202104262359
Blood Product Expiration Date: 202104262359
Blood Product Expiration Date: 202104262359
ISSUE DATE / TIME: 202104211542
ISSUE DATE / TIME: 202104211542
ISSUE DATE / TIME: 202104211610
ISSUE DATE / TIME: 202104211610
Unit Type and Rh: 7300
Unit Type and Rh: 7300
Unit Type and Rh: 7300
Unit Type and Rh: 7300
Unit Type and Rh: 7300
Unit Type and Rh: 7300

## 2019-09-06 LAB — BPAM RBC
Blood Product Expiration Date: 202105012359
Blood Product Expiration Date: 202105052359
ISSUE DATE / TIME: 202104201723
Unit Type and Rh: 7300
Unit Type and Rh: 7300

## 2019-09-06 LAB — BASIC METABOLIC PANEL
Anion gap: 9 (ref 5–15)
Anion gap: 10 (ref 5–15)
BUN: 36 mg/dL — ABNORMAL HIGH (ref 6–20)
BUN: 36 mg/dL — ABNORMAL HIGH (ref 6–20)
CO2: 26 mmol/L (ref 22–32)
CO2: 27 mmol/L (ref 22–32)
Calcium: 9.2 mg/dL (ref 8.9–10.3)
Calcium: 9.1 mg/dL (ref 8.9–10.3)
Chloride: 95 mmol/L — ABNORMAL LOW (ref 98–111)
Chloride: 94 mmol/L — ABNORMAL LOW (ref 98–111)
Creatinine, Ser: 1.4 mg/dL — ABNORMAL HIGH (ref 0.61–1.24)
Creatinine, Ser: 1.36 mg/dL — ABNORMAL HIGH (ref 0.61–1.24)
GFR calc Af Amer: >60 mL/min (ref >60)
GFR calc Af Amer: >60 mL/min (ref >60)
GFR calc non Af Amer: 60 mL/min — ABNORMAL LOW (ref >60)
GFR calc non Af Amer: >60 mL/min (ref >60)
Glucose, Bld: 128 mg/dL — ABNORMAL HIGH (ref 70–99)
Glucose, Bld: 133 mg/dL — ABNORMAL HIGH (ref 70–99)
Potassium: 5.7 mmol/L — ABNORMAL HIGH (ref 3.5–5.1)
Potassium: 5 mmol/L (ref 3.5–5.1)
Sodium: 130 mmol/L — ABNORMAL LOW (ref 135–145)
Sodium: 131 mmol/L — ABNORMAL LOW (ref 135–145)

## 2019-09-06 LAB — COOXEMETRY PANEL
Carboxyhemoglobin: 3.7 % — ABNORMAL HIGH (ref 0.5–1.5)
Carboxyhemoglobin: 3.4 % — ABNORMAL HIGH (ref 0.5–1.5)
Methemoglobin: 1.2 % (ref 0.0–1.5)
Methemoglobin: 0.8 % (ref 0.0–1.5)
O2 Saturation: 93.4 %
O2 Saturation: 59.8 %
Total hemoglobin: 9.1 g/dL — ABNORMAL LOW (ref 12.0–16.0)
Total hemoglobin: 8.1 g/dL — ABNORMAL LOW (ref 12.0–16.0)

## 2019-09-06 LAB — LACTATE DEHYDROGENASE: LDH: 169 U/L (ref 98–192)

## 2019-09-06 LAB — CBC
HCT: 29 % — ABNORMAL LOW (ref 39.0–52.0)
Hemoglobin: 8.8 g/dL — ABNORMAL LOW (ref 13.0–17.0)
MCH: 29 pg (ref 26.0–34.0)
MCHC: 30.3 g/dL (ref 30.0–36.0)
MCV: 95.7 fL (ref 80.0–100.0)
Platelets: 215 10*3/uL (ref 150–400)
RBC: 3.03 MIL/uL — ABNORMAL LOW (ref 4.22–5.81)
RDW: 18.3 % — ABNORMAL HIGH (ref 11.5–15.5)
WBC: 13 10*3/uL — ABNORMAL HIGH (ref 4.0–10.5)
nRBC: 0 % (ref 0.0–0.2)

## 2019-09-06 LAB — TYPE AND SCREEN
ABO/RH(D): B POS
Antibody Screen: POSITIVE
DAT, IgG: NEGATIVE
Donor AG Type: NEGATIVE
Donor AG Type: NEGATIVE
Unit division: 0
Unit division: 0

## 2019-09-06 LAB — GLUCOSE, CAPILLARY
Glucose-Capillary: 117 mg/dL — ABNORMAL HIGH (ref 70–99)
Glucose-Capillary: 117 mg/dL — ABNORMAL HIGH (ref 70–99)
Glucose-Capillary: 142 mg/dL — ABNORMAL HIGH (ref 70–99)
Glucose-Capillary: 206 mg/dL — ABNORMAL HIGH (ref 70–99)
Glucose-Capillary: 63 mg/dL — ABNORMAL LOW (ref 70–99)
Glucose-Capillary: 122 mg/dL — ABNORMAL HIGH (ref 70–99)

## 2019-09-06 LAB — URINE CULTURE
Culture: NO GROWTH
Special Requests: NORMAL

## 2019-09-06 LAB — MAGNESIUM: Magnesium: 2.4 mg/dL (ref 1.7–2.4)

## 2019-09-06 LAB — PROTIME-INR
INR: 1.4 — ABNORMAL HIGH (ref 0.8–1.2)
Prothrombin Time: 17.3 seconds — ABNORMAL HIGH (ref 11.4–15.2)

## 2019-09-06 LAB — HEPARIN LEVEL (UNFRACTIONATED)
Heparin Unfractionated: <0.1 IU/mL — ABNORMAL LOW (ref 0.30–0.70)
Heparin Unfractionated: 0.18 IU/mL — ABNORMAL LOW (ref 0.30–0.70)

## 2019-09-06 MED ORDER — HEPARIN (PORCINE) 25000 UT/250ML-% IV SOLN
700.0000 [IU]/h | INTRAVENOUS | Status: DC
  Administered 2019-09-06: 700 [IU]/h via INTRAVENOUS
  Filled 2019-09-06: qty 250

## 2019-09-06 MED ORDER — FENTANYL CITRATE (PF) 100 MCG/2ML IJ SOLN
25.0000 ug | INTRAMUSCULAR | Status: DC | PRN
  Administered 2019-09-08 – 2019-09-11 (×2): 50 ug via INTRAVENOUS
  Filled 2019-09-06 (×2): qty 2

## 2019-09-06 MED ORDER — ONDANSETRON HCL 4 MG/2ML IJ SOLN: 4.0000 mg | Freq: Four times a day (QID) | INTRAMUSCULAR | Status: DC | PRN

## 2019-09-06 MED ORDER — ACETAMINOPHEN 160 MG/5ML PO SOLN
1000.0000 mg | Freq: Four times a day (QID) | ORAL | Status: AC
  Filled 2019-09-06: qty 40.6

## 2019-09-06 MED ORDER — HYDROMORPHONE HCL 1 MG/ML IJ SOLN
1.0000 mg | Freq: Three times a day (TID) | INTRAMUSCULAR | Status: AC | PRN
  Administered 2019-09-07 – 2019-09-11 (×3): 1 mg via INTRAVENOUS
  Filled 2019-09-06 (×3): qty 1

## 2019-09-06 MED ORDER — INSULIN ASPART 100 UNIT/ML ~~LOC~~ SOLN
0.0000 [IU] | SUBCUTANEOUS | Status: DC
  Administered 2019-09-06: 8 [IU] via SUBCUTANEOUS
  Administered 2019-09-06: 2 [IU] via SUBCUTANEOUS
  Administered 2019-09-07: 09:00:00 4 [IU] via SUBCUTANEOUS
  Administered 2019-09-08: 8 [IU] via SUBCUTANEOUS
  Administered 2019-09-10 (×2): 2 [IU] via SUBCUTANEOUS

## 2019-09-06 MED ORDER — SODIUM CHLORIDE 0.9 % IV SOLN: INTRAVENOUS | Status: DC | PRN

## 2019-09-06 MED ORDER — DEXTROSE-NACL 5-0.45 % IV SOLN: INTRAVENOUS | Status: DC

## 2019-09-06 MED ORDER — SODIUM CHLORIDE 0.9 % IV SOLN
INTRAVENOUS | Status: DC | PRN
  Administered 2019-09-06: 500 mL via INTRAVENOUS

## 2019-09-06 MED ORDER — ACETAMINOPHEN 500 MG PO TABS
1000.0000 mg | ORAL_TABLET | Freq: Four times a day (QID) | ORAL | Status: AC
  Administered 2019-09-06 – 2019-09-11 (×19): 1000 mg via ORAL
  Filled 2019-09-06 (×19): qty 2

## 2019-09-06 MED ORDER — WARFARIN - PHARMACIST DOSING INPATIENT: Freq: Every day | Status: DC

## 2019-09-06 MED ORDER — BISACODYL 5 MG PO TBEC
10.0000 mg | DELAYED_RELEASE_TABLET | Freq: Every day | ORAL | Status: DC
  Administered 2019-09-06 – 2019-09-09 (×3): 10 mg via ORAL
  Filled 2019-09-06 (×6): qty 2

## 2019-09-06 MED ORDER — WARFARIN SODIUM 5 MG PO TABS
5.0000 mg | ORAL_TABLET | Freq: Once | ORAL | Status: AC
  Administered 2019-09-06: 5 mg via ORAL
  Filled 2019-09-06: qty 1

## 2019-09-06 MED ORDER — FUROSEMIDE 10 MG/ML IJ SOLN: 40.0000 mg | Freq: Two times a day (BID) | INTRAMUSCULAR | Status: DC

## 2019-09-06 MED ORDER — SENNOSIDES-DOCUSATE SODIUM 8.6-50 MG PO TABS
1.0000 | ORAL_TABLET | Freq: Every day | ORAL | Status: DC
  Administered 2019-09-07: 22:00:00 1 via ORAL
  Filled 2019-09-06 (×2): qty 1

## 2019-09-06 MED ORDER — METOCLOPRAMIDE HCL 5 MG/ML IJ SOLN
10.0000 mg | Freq: Four times a day (QID) | INTRAMUSCULAR | Status: AC
  Administered 2019-09-06 – 2019-09-11 (×18): 10 mg via INTRAVENOUS
  Filled 2019-09-06 (×18): qty 2

## 2019-09-06 MED ORDER — FUROSEMIDE 10 MG/ML IJ SOLN
40.0000 mg | Freq: Once | INTRAMUSCULAR | Status: AC
  Administered 2019-09-06: 40 mg via INTRAVENOUS
  Filled 2019-09-06: qty 4

## 2019-09-06 NOTE — Progress Notes (Signed)
ANTICOAGULATION CONSULT NOTE - Follow Up Consult  Pharmacy Consult for warfarin + heparin Indication: LVAD  No Known Allergies  Patient Measurements: Height: 6\' 2"  (188 cm) Weight: 99.3 kg (218 lb 14.7 oz) IBW/kg (Calculated) : 82.2  Vital Signs: Temp: 98.6 F (37 C) (04/22 0722) Temp Source: Oral (04/22 0400) BP: 102/78 (04/22 0700) Pulse Rate: 86 (04/22 0700)  Labs: Recent Labs    09/04/19 0424 09/04/19 0424 09/05/19 0418 09/05/19 0418 09/05/19 1603 09/06/19 0354 09/06/19 0355 09/06/19 0743  HGB 8.1*   < > 9.4*   < > 10.5*  --  8.8*  --   HCT 26.6*   < > 30.8*  --  31.0*  --  29.0*  --   PLT 250  --  219  --   --   --  215  --   LABPROT 17.2*  --  18.0*  --   --   --  17.3*  --   INR 1.4*  --  1.5*  --   --   --  1.4*  --   HEPARINUNFRC <0.10*  --  <0.10*  --   --  <0.10*  --   --   CREATININE 1.81*   < > 1.53*  --   --   --  1.40* 1.36*   < > = values in this interval not displayed.    Estimated Creatinine Clearance: 85.4 mL/min (A) (by C-G formula based on SCr of 1.36 mg/dL (H)).   Medical History: Past Medical History:  Diagnosis Date  . Asthma   . CHF (congestive heart failure) ([facility])    a. 09/2016: EF 20-25% with cath showing normal cors  . GERD (gastroesophageal reflux disease)   . History of hiatal hernia   . LVAD (left ventricular assist device) present ([facility])   . OSA on CPAP 09/06/2018   Severe OSA with AHI 68/hr on CPAP at 12cm H2O    Assessment: 48yom with HF s/p LVAD implant HM3 place 4/19 admitted with drive line infection. S/p OR on 4/15 for repeat I&D with application of wound vac.   Warfarin PTA 7.5mg  MW and 5mg  all other days admit INR elevated at 5 - probably acute infection related supra-sensitive.   Warfarin has been on hold since 4/19. Heparin was started on 4/18 with INR <2 and with plans to return to OR on 4/21.  Now back from the OR. INR is 1.4. CBC stable, no bleeding noted. After discussing with Dr. 5/18, will resume heparin  and warfarin today.  Goal of Therapy:  INR 2-2.5 Monitor platelets by anticoagulation protocol: Yes   Plan:  -Restart heparin at 700 units/hr -Check heparin level in 6 hours -Will discuss with MD prior to rate increases -Warfarin 5 mg x 1 today -Daily INR, CBC, heparin level  5/21, PharmD, [facility] PGY2 Cardiology Pharmacy Resident Phone [phone] 09/06/2019       9:15 AM  Please check [email or web address] for unit-specific pharmacist phone numbers

## 2019-09-06 NOTE — Progress Notes (Signed)
1 Day Post-Op  Subjective:  Jonathan Wood is doing well s/p debridement/left pec flap. Reports pain 7/10 (surgical), but manageable with pump. No other complaints. No f/c/v/cp/sob  Wound vac in place with good seal over midline chest. JP drains with total of 175 cc.   Objective: Vital signs in last 24 hours: Temp:  [97.3 F (36.3 C)-98.6 F (37 C)] 97.7 F (36.5 C) (04/22 1101) Pulse Rate:  [40-103] 103 (04/22 1400) Resp:  [14-30] 24 (04/22 1400) BP: (84-128)/(63-110) 88/75 (04/22 1400) SpO2:  [85 %-100 %] 98 % (04/22 1400) Weight:  [99.3 kg] 99.3 kg (04/22 0500) Last BM Date: 09/04/19  Intake/Output from previous day: 04/21 0701 - 04/22 0700 In: 1803.3 [I.V.:823.9; Blood:195; IV Piggyback:784.4] Out: 2215 [Urine:1750; Drains:165; Blood:300] Intake/Output this shift: Total I/O In: 613.2 [P.O.:220; I.V.:293.2; IV Piggyback:100] Out: 1475 [Urine:1300; Drains:175]  General appearance: alert, no distress and resting in bed Nose: nasal cannula in place Resp: unlabored Chest wall: Wound vac in place, good seal noted. JP drains in place with serosanguinous drainage, no erythema noted. TTP. LVAD drive line in place L abdomen Extremities: No swelling noted of distal LE  Lab Results:  CBC Latest Ref Rng & Units 09/06/2019 09/05/2019 09/05/2019  WBC 4.0 - 10.5 K/uL 13.0(H) - 11.9(H)  Hemoglobin 13.0 - 17.0 g/dL 5.3(I) 10.5(L) 9.4(L)  Hematocrit 39.0 - 52.0 % 29.0(L) 31.0(L) 30.8(L)  Platelets 150 - 400 K/uL 215 - 219    BMET Recent Labs    09/06/19 0355 09/06/19 0743  NA 130* 131*  K 5.7* 5.0  CL 95* 94*  CO2 26 27  GLUCOSE 128* 133*  BUN 36* 36*  CREATININE 1.40* 1.36*  CALCIUM 9.2 9.1   PT/INR Recent Labs    09/05/19 0418 09/06/19 0355  LABPROT 18.0* 17.3*  INR 1.5* 1.4*   ABG Recent Labs    09/05/19 1603  HCO3 29.3*    Studies/Results: DG Chest Port 1 View  Result Date: 09/05/2019 CLINICAL DATA:  LVAD present. EXAM: PORTABLE CHEST 1 VIEW COMPARISON:   Radiograph 08/28/2018 FINDINGS: Post median sternotomy. Left ventricular assist device is unchanged. Right upper extremity PICC remains in place with tip in the lower SVC. Stable cardiomegaly. Improved interstitial thickening from prior exam. Probable scarring in the left mid lung. Possible small left pleural effusion, similar to prior. No pneumothorax. Stable osseous structures. IMPRESSION: 1. Left ventricular assist device in place.  Stable cardiomegaly. 2. Improved pulmonary edema from prior. Unchanged small left pleural effusion. Electronically Signed   By: Narda Rutherford M.D.   On: 09/05/2019 19:26    Anti-infectives: Anti-infectives (From admission, onward)   Start     Dose/Rate Route Frequency Ordered Stop   09/05/19 1455  polymyxin B 500,000 Units, bacitracin 50,000 Units in sodium chloride 0.9 % 500 mL irrigation  Status:  Discontinued       As needed 09/05/19 1455 09/05/19 1713   09/04/19 1330  ceFEPIme (MAXIPIME) 2 g in sodium chloride 0.9 % 100 mL IVPB     2 g 200 mL/hr over 30 Minutes Intravenous Every 12 hours 09/04/19 1328     09/04/19 1245  ceFEPIme (MAXIPIME) 1 g in sodium chloride 0.9 % 100 mL IVPB  Status:  Discontinued     1 g 200 mL/hr over 30 Minutes Intravenous Every 12 hours 09/04/19 1235 09/04/19 1328   09/04/19 0857  ceFAZolin (ANCEF) IVPB 2g/100 mL premix  Status:  Discontinued     2 g 200 mL/hr over 30 Minutes Intravenous 30 min pre-op  09/04/19 0857 09/04/19 1235   09/03/19 0751  vancomycin (VANCOCIN) powder  Status:  Discontinued       As needed 09/03/19 0751 09/03/19 0833   08/31/19 1200  ceFAZolin (ANCEF) IVPB 2g/100 mL premix  Status:  Discontinued     2 g 200 mL/hr over 30 Minutes Intravenous Every 8 hours 08/31/19 1016 09/04/19 1235   08/31/19 1200  rifabutin (MYCOBUTIN) capsule 300 mg     300 mg Oral Daily 08/31/19 1023     08/31/19 0100  vancomycin (VANCOREADY) IVPB 1250 mg/250 mL  Status:  Discontinued     1,250 mg 166.7 mL/hr over 90 Minutes  Intravenous Every 8 hours 08/30/19 2026 08/31/19 1016   08/28/19 2100  vancomycin (VANCOREADY) IVPB 1250 mg/250 mL  Status:  Discontinued     1,250 mg 166.7 mL/hr over 90 Minutes Intravenous Every 8 hours 08/28/19 1422 08/30/19 2026   08/28/19 2000  ceFEPIme (MAXIPIME) 2 g in sodium chloride 0.9 % 100 mL IVPB  Status:  Discontinued     2 g 200 mL/hr over 30 Minutes Intravenous Every 8 hours 08/28/19 1421 08/31/19 1016   08/28/19 0945  ceFEPIme (MAXIPIME) 2 g in sodium chloride 0.9 % 100 mL IVPB  Status:  Discontinued     2 g 200 mL/hr over 30 Minutes Intravenous Every 8 hours 08/28/19 0939 08/28/19 1421   08/28/19 0945  vancomycin (VANCOREADY) IVPB 1750 mg/350 mL     1,750 mg 175 mL/hr over 120 Minutes Intravenous  Once 08/28/19 0944 08/28/19 1409   08/24/19 1022  vancomycin (VANCOCIN) powder  Status:  Discontinued       As needed 08/24/19 1023 08/24/19 1205   08/22/19 0858  vancomycin (VANCOCIN) powder  Status:  Discontinued       As needed 08/22/19 0859 08/22/19 1015   08/20/19 1532  vancomycin (VANCOCIN) powder  Status:  Discontinued       As needed 08/20/19 1533 08/20/19 1605   08/20/19 1200  ceFAZolin (ANCEF) IVPB 2g/100 mL premix  Status:  Discontinued     2 g 200 mL/hr over 30 Minutes Intravenous To ShortStay Surgical 08/20/19 0748 08/20/19 1634   08/20/19 1030  vancomycin (VANCOCIN) 1,000 mg in sodium chloride 0.9 % 1,000 mL irrigation      Irrigation To Surgery 08/20/19 1020 08/20/19 1528   08/20/19 0830  piperacillin-tazobactam (ZOSYN) IVPB 3.375 g     3.375 g 12.5 mL/hr over 240 Minutes Intravenous  Once 08/20/19 0821 08/20/19 1441   08/18/19 1530  rifampin (RIFADIN) capsule 600 mg  Status:  Discontinued     600 mg Oral Daily 08/18/19 1420 08/22/19 1512   08/18/19 1500  ceFAZolin (ANCEF) IVPB 2g/100 mL premix  Status:  Discontinued     2 g 200 mL/hr over 30 Minutes Intravenous Every 8 hours 08/18/19 1420 08/28/19 0933   08/17/19 2200  nafcillin 2 g in sodium chloride 0.9 %  100 mL IVPB  Status:  Discontinued     2 g 200 mL/hr over 30 Minutes Intravenous Every 6 hours 08/17/19 2111 08/18/19 1420   08/17/19 1200  vancomycin (VANCOREADY) IVPB 1250 mg/250 mL  Status:  Discontinued     1,250 mg 166.7 mL/hr over 90 Minutes Intravenous Every 12 hours 08/16/19 1605 08/17/19 1020   08/17/19 1200  vancomycin (VANCOREADY) IVPB 750 mg/150 mL  Status:  Discontinued     750 mg 150 mL/hr over 60 Minutes Intravenous Every 12 hours 08/17/19 1020 08/17/19 2111   08/16/19 1700  ceFEPIme (MAXIPIME)  2 g in sodium chloride 0.9 % 100 mL IVPB  Status:  Discontinued     2 g 200 mL/hr over 30 Minutes Intravenous Every 8 hours 08/16/19 1600 08/17/19 2111   08/16/19 1615  vancomycin (VANCOREADY) IVPB 2000 mg/400 mL     2,000 mg 200 mL/hr over 120 Minutes Intravenous  Once 08/16/19 1600 08/17/19 0232      Assessment/Plan: s/p Procedure(s): DEBRIDEMENT AND CLOSURE OF ABDOMINAL WOUND MUSCLE FLAP CLOSURE Application Of A-Cell Of Chest/Abdomen  Patient is doing well, no complaints other than post-surgical pain.   Wound vac in place, will remove in ~ 1 week.  Advance ABRA straps in the next few days.  JP drains in place, will continue to follow output.  DVT ppx with heparin per primary team.  Call with questions/concerns.   LOS: 21 days    Charlies Constable, PA-C 09/06/2019

## 2019-09-06 NOTE — Progress Notes (Signed)
VAD Coordinator Procedure Note:   VAD Coordinator met patient in 2C10. Pt undergoing debridement and closure of abdominal wound with muscle flap closure per Dr. Marla Roe and Dr. Darcey Nora. Hemodynamics and VAD parameters monitored by myself and anesthesia throughout the procedure. Blood pressures were obtained with automatic cuff on left arm and correlated with Doppler MAP. Prior to starting surgery, left radial Aline placed per anesthesia for blood pressure monitoring.     Time: Doppler Auto  BP Flow PI Power Speed  Pre-procedure:           13:25 80 102/66 (78) 5.2 2.6 4.4 5600           Secation Induction:          14:00  101/75 (79) 5.2 2.4 4.2    14:15  105/85 (92) 4.7 2.6 4.1   A-line placed   A-line       14:30  93/77 (89) 4.9 2.0 4.1    14:45  112/86 (105) 4.0 2.9 4.3    15:00  109/83 (101) 4.4 2.9 4.1    15:15  98/77 (92) 4.7 2.6 4.2    15:30  107/80 (98) 4.2 3.3 4.0    15:45  106/83 (99) 4.3 3.3 4.2    16:00  106/81 (98) 4.5 3.3 4.1    16:15  111/81 (102) 4.4 3.3 4.1    16:30  112/83 (103) 4.2 3.7 4.0    16:45  103/79 (95) 4.5 3.1 4.1    17:00  102/80 (93) 4.4 3.5 4.0            Recovery Area:          17:15  118/101 (110) 4.6 5.4 4.2    17:30  114/100 (104) 4.6 5.1 4.2              Patient tolerated the procedure well. VAD Coordinator accompanied and remained with patient in recovery area. Fentanyl PCA pump initiated in PACU.   DL site care: Existing VAD dressing removed and site care performed using sterile technique. Drive line exit site cleaned with Chlora prep applicators x 2, allowed to dry, and Sorbaview dressing with bio patch re-applied. Exit site healed and incorporated, the velour is fully implanted at exit site. No redness, tenderness, drainage, foul odor or rash noted. Drive line anchor re-applied.   Patient Disposition: 1K55  Zada Girt RN, VAD Coordinator 24/7 VAD Pager: [phone]

## 2019-09-06 NOTE — Progress Notes (Addendum)
Patient ID: Jonathan Wood, male   DOB: 24-Mar-1971, 49 y.o.   MRN: [id number]     Advanced Heart Failure Rounding Note  PCP-Cardiologist: [name], MD    Patient Profile   49 y.o. 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  Subjective:    - 4/5 S/p I&D w/ excisional debridement of subxiphoid abscess. Cultures + Staph Aureus  - 4/7, Returned to OR for debridement, Irrigation and Packing of Abdominal Incision - 4/9 S/P I&D abd wound with Acell and VAC application.  - 4/15 s/p I&D wound with VAC application.  - 4/19 S/P I&D with VAC application.  - 4/21 S/P reconstructive surgery with pec flap  Switched to cefepime on 4/20 for WBC 20k. WBC down to 11>> 13 today (received steroids in OR yesterday). Remains AF but notes chills.  Off diuretics currently for AKI. SCr improving, 1.8>>1.53>>1.40. Wt up 4 lb today. JVD to ear. No dyspnea. CVP 13-14.  Co-ox 60%  Complains of 9/10 post surgical pain. Has pain pump. Hungry and asking for food.   HM3 VAD Interrogation: Speed:5600 Flow:4.9 Power:4.2   PI: 2.9. 1 PI event  VAD interrogated personally. Parameters stable.  Objective:   Weight Range: 99.3 kg Body mass index is 28.11 kg/m.   Vital Signs:   Temp:  [97.3 F (36.3 C)-98.3 F (36.8 C)] 97.3 F (36.3 C) (04/22 0400) Pulse Rate:  [40-95] 86 (04/22 0700) Resp:  [15-28] 19 (04/22 0700) BP: (84-128)/(49-110) 102/78 (04/22 0700) SpO2:  [85 %-100 %] 97 % (04/22 0700) Weight:  [99.3 kg] 99.3 kg (04/22 0500) Last BM Date: 09/04/19  Weight change: Filed Weights   09/04/19 0258 09/05/19 0337 09/06/19 0500  Weight: 96.8 kg 97.5 kg 99.3 kg    Intake/Output:   Intake/Output Summary (Last 24 hours) at 09/06/2019 1572 Last data filed at 09/06/2019 0600 Gross per 24 hour  Intake 1803.32 ml  Output 2215 ml  Net  -411.68 ml    Telemetry: NSR 80-90s Personally reviewed   Physical Exam   CVP 13-14 General:  [name] in no acute distress  HEENT: normal  Neck: supple. JVP elevated to ear Carotids 2+ bilat; no bruits. No lymphadenopathy or thryomegaly appreciated. Cor: + LVAD hum.  Lungs: Clear, no wheezing  Abdomen: wound vac in place and stable. Abdomen obese but soft, nontender, non-distended. No hepatosplenomegaly. No bruits or masses. Good bowel sounds. Driveline site clean. Anchor in place.  Extremities: no cyanosis, clubbing, rash. No LEE + SCDs Neuro: alert & oriented x 3. No focal deficits. Moves all 4 without problem    Labs    CBC Recent Labs    09/05/19 0418 09/05/19 0418 09/05/19 1603 09/06/19 0355  WBC 11.9*  --   --  13.0*  HGB 9.4*   < > 10.5* 8.8*  HCT 30.8*   < > 31.0* 29.0*  MCV 94.8  --   --  95.7  PLT 219  --   --  215   < > = values in this interval not displayed.   Basic Metabolic Panel Recent Labs    09/05/19 0418 09/05/19 0418 09/05/19 1603 09/06/19 0355  NA 134*   < > 133* 130*  K 3.4*   < > 4.3 5.7*  CL 95*  --   --  95*  CO2 29  --   --  26  GLUCOSE 126*  --   --  128*  BUN 43*  --   --  36*  CREATININE 1.53*  --   --  1.40*  CALCIUM 8.4*  --   --  9.2  MG 1.5*  --   --  2.4   < > = values in this interval not displayed.   Liver Function Tests No results for input(s): AST, ALT, ALKPHOS, BILITOT, PROT, ALBUMIN in the last 72 hours. No results for input(s): LIPASE, AMYLASE in the last 72 hours. Cardiac Enzymes No results for input(s): CKTOTAL, CKMB, CKMBINDEX, TROPONINI in the last 72 hours.  BNP: BNP (last 3 results) Recent Labs    03/21/19 1415 04/23/19 1350  BNP 426.9* 709.6*    ProBNP (last 3 results) No results for input(s): PROBNP in the last 8760 hours.   D-Dimer No results for input(s): DDIMER in the last 72 hours. Hemoglobin A1C No results for input(s): HGBA1C in the last 72 hours. Fasting Lipid Panel No results for input(s):  CHOL, HDL, LDLCALC, TRIG, CHOLHDL, LDLDIRECT in the last 72 hours. Thyroid Function Tests No results for input(s): TSH, T4TOTAL, T3FREE, THYROIDAB in the last 72 hours.  Invalid input(s): FREET3  Other results:   Imaging    DG Chest Port 1 View  Result Date: 09/05/2019 CLINICAL DATA:  LVAD present. EXAM: PORTABLE CHEST 1 VIEW COMPARISON:  Radiograph 08/28/2018 FINDINGS: Post median sternotomy. Left ventricular assist device is unchanged. Right upper extremity PICC remains in place with tip in the lower SVC. Stable cardiomegaly. Improved interstitial thickening from prior exam. Probable scarring in the left mid lung. Possible small left pleural effusion, similar to prior. No pneumothorax. Stable osseous structures. IMPRESSION: 1. Left ventricular assist device in place.  Stable cardiomegaly. 2. Improved pulmonary edema from prior. Unchanged small left pleural effusion. Electronically Signed   By: Narda Rutherford M.D.   On: 09/05/2019 19:26     Medications:     Scheduled Medications: . sodium chloride   Intravenous Once  . acetaminophen  1,000 mg Oral Q6H   Or  . acetaminophen (TYLENOL) oral liquid 160 mg/5 mL  1,000 mg Oral Q6H  . amiodarone  200 mg Oral BID  . vitamin C  250 mg Oral Daily  . bisacodyl  10 mg Oral Daily  . Chlorhexidine Gluconate Cloth  6 each Topical Daily  . docusate sodium  100 mg Oral TID  . DULoxetine  30 mg Oral Daily  . feeding supplement (ENSURE ENLIVE)  237 mL Oral TID BM  . feeding supplement (PRO-STAT SUGAR FREE 64)  30 mL Oral BID  . fentaNYL   Intravenous Q4H  . ferrous sulfate  325 mg Oral Q breakfast  . gabapentin  300 mg Oral TID  . hydrALAZINE  75 mg Oral TID  . insulin aspart  0-24 Units Subcutaneous Q4H  . linaclotide  145 mcg Oral Daily  . mouth rinse  15 mL Mouth Rinse BID  . metoCLOPramide (REGLAN) injection  10 mg Intravenous Q6H  . multivitamin with minerals  1 tablet Oral Daily  . pantoprazole  40 mg Oral BID  . rifabutin  300 mg  Oral Daily  . senna-docusate  1 tablet Oral QHS  . sildenafil  20 mg Oral TID  . sodium chloride flush  10-40 mL Intracatheter Q12H  . sodium chloride flush  3 mL Intravenous Q12H  . spironolactone  12.5 mg Oral Daily  . zinc sulfate  220 mg Oral Daily    Infusions: .  sodium chloride 10 mL/hr at 08/30/19 1000  . sodium chloride    . ceFEPime (MAXIPIME) IV Stopped (09/05/19 2337)  . dextrose 5 % and 0.45% NaCl Stopped (08/25/19 1414)  . dextrose 5 % and 0.45% NaCl 50 mL/hr at 09/06/19 0600  . lactated ringers Stopped (09/05/19 1651)  . phenylephrine (NEO-SYNEPHRINE) Adult infusion      PRN Medications: sodium chloride, Place/Maintain arterial line **AND** sodium chloride, acetaminophen, albuterol, alum & mag hydroxide-simeth, bisacodyl **OR** bisacodyl, diphenhydrAMINE **OR** diphenhydrAMINE, fentaNYL (SUBLIMAZE) injection, guaiFENesin-dextromethorphan, hydrOXYzine, magnesium hydroxide, naloxone **AND** sodium chloride flush, ondansetron (ZOFRAN) IV, ondansetron (ZOFRAN) IV, oxyCODONE, sodium chloride flush, sodium chloride flush, traMADol    Assessment/Plan    1. Fever & leukocytosis -> MSSA bacteremia/ Subxiphoid Abscess  - bcx 2/2 MSSA - ID has changed antibiotics to cefazolin, now has PICC.   - WBC trending down.  Now 13.  Afebrile. - He is not a candidate for VAD exchange due to severe RV failure and noncompliance.  - S/p I&D on 4/5. Wound cultures +MSSA.  - Back to OR 4/7, 4/9, 4/15 , 4/19 debridement and irrigation of driveline + packing, wound vac in place.    -S/P reconstructive surgery with pec flap 4/21 - ID recommends 6 weeks of IV cefazolin followed by long-term suppressive oral cephalexin. Switched to cefepime on 4/19  He has PICC line. Remains afebrile - INR 1.4 Off warfarin. Off heparin currently as well.   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  S/p HM-3 LVAD on 09/06/17 - On sildenafil for RV failure.  - CO-OX  60% - CVP 13-14. Diuretics on hold for AKI, which is improving. Wt up 4 lb after perioperative fluids. Give IV Lasix 40 mg x 1.  - Continue current dose of hydralazine and sildenafil. No imdur with sildenafil. MAPs in the 80s.   3. HM3 LVAD 09/06/2017. - VAD interrogated personally. Parameters stable. - LDH 169 today  - INR 1.4. Warfarin and heparin on hold post op. Restart when cleared by Dr. Donata Clay - Off ASA with PUD.    4. Essential HTN - MAP stable 80s-90s - Continue hydralazine 75 mg tid. - Continue sildenafil 20 mg tid     5. AKI - Creatinine improved with IVF and diuretic hold, 1.8>>1.5>>1.4  - monitor   6. OSA - sleep study with very severe OSA (AHI 69/hr) - non-compliant Bipap in the past, has refused to use.  - He continues to tell respiratory that he will wear the machine himself.   - He is only using intermittently.   7. H/o GI bleed  - 08/2018 EGD completed and showed gastritis and nonbleeding duodenal ulcers, colonoscopy with 5 polyps removed. Protonix was increased to BID.  - No recent GI bleeding - hgb 8.8 today (expected surgical blood loss) - Transfuse hgb < 7.  - He will stay off aspirin for now.   8. Tobacco Abuse  - says he quit recently  9. Severe protein calorie malnutrition  - Albumin 2.1 on 4/14 - nutrition seeing   10 Hyperkalemia  - K 5.7 - Plan IV Lasix per above and repeat BMP - can use Lokelma or Veltassa if needed   12. Hypomagnesium - resolved. Mg 2.4 today   Length of Stay: [address] PA-C  09/06/2019, 7:12 AM  Advanced Heart Failure Team Pager [phone] (M-F; 7a - 4p)  Please contact [facility] for night-coverage after hours (4p -7a ) and  weekends on [email or web address] 7:12 AM   Patient seen and examined with the above-signed Advanced Practice Provider and/or Housestaff. I personally reviewed laboratory data, imaging studies and relevant notes. I independently examined the patient and formulated the important  aspects of the plan. I have edited the note to reflect any of my changes or salient points. I have personally discussed the plan with the patient and/or family.  S/p pec flap yesterday to close wound. Still with significant post-op pain despite PCA. Remains on cefepime. Afebrile. WBC 13k. Hungry. Weight and CVP up post-op. VAD interrogated personally. Parameters stable.  General:  HEENT: normal Lying in bed uncomfortable but non-toxic Neck: supple. JVP not elevated.  Carotids 2+ bilat; no bruits. No lymphadenopathy or thryomegaly appreciated. Cor: LVAD hum.  Lungs: Clear. Abdomen: obese non-distended. + wound vac site in epigastrum. + tender to palpation Good bowel sounds. Driveline site clean. Anchor in place.  Extremities: no cyanosis, clubbing, rash. Warm tr -1+ edema  Neuro: alert & oriented x 3. No focal deficits. Moves all 4 without problem    Still with significant post-op pain. Managing with PCA. I gave him dilaudid for breakthrough. Watch for narcotic ileus. Volume status up mildly. Can give 1 dose lasix. Anticoagulation per Dr. Donata Clay. Continue abx. Mobilize as able.   Arvilla Meres, MD  9:29 AM

## 2019-09-06 NOTE — Progress Notes (Signed)
LVAD Coordinator Rounding Note:  Admitted 08/16/19 by Dr. Bensimhon due to VAD drive line infection/pneumonia/sepsis  HM III LVAD implanted on 09/06/17 by Dr. Bartle under Destination Therapy criteria.  Pt in bed, receiving bed bath. Reports he had "rough" night and has had a "lot of pain". Nurse reports he has not been using his PCA pump to self medicate. Pt says "I keep forgetting I have one". Reminded him to use as needed for pain. Nurse reports he also has prn pain medication ordered.  Montgomery straps remain in place over sternal wound; wound vac in place with bloody drainage.   Vital signs: Temp: 98.6 HR: 85 A-line:  121/106 (109) Sat: 100% on RA Wt: 227.5>231>235>233.9>236.3.....216>220>219>216>213>213>214.9>218.9 lbs   LVAD interrogation reveals:  Speed: 5600 Flow: 4.9 Power:  4.2w PI:  3.7 Alarms: none Events: rare PI Hematocrit: 27  Fixed speed: 5600 Low speed limit: 5300  Drive Line:  Left abdominal drive line dressing C/D/I with anchor unattached. Anchor replaced. Weekly dressing changes per BS nurse.   Sternal wound: dressing with montgomery straps in place; wound vac attached to suction  Blood products: - 09/04/19>>1 PCs - 09/05/19>>2 FFP  Labs:  LDH trend: 227>230>271........208>187>161>175>168>172>149>169  INR trend: 4.5>5.0>1.7>1.7>1.7>1.8>1.7>2.1>3.4>2.5>2.7>2.1>1.4>1.5>1.4>1.5>1.4    WBC: 27.3>26.2>24>34.6>25.8>20.8......13.0>12.4>14.8>19.3>11.9>13.0  Alk Phos: 138> 177>123  AST: 53> 154>33  ALT: 17> 35>13  Infection: - blood cultures 08/16/19>>staphylococcus aureus - blood cultures 08/18/19>> NGTD - Drive line tunnel / sternum 08/20/19>> abundant WBC, gram + cocci, culture- few staph aureus - Drive line AFB 4/5/221>> pending - Drive line/Sternal Fungus culture 08/20/19>> negative - blood cultures 09/02/19>>negative/fina. - body fluid culture 08/28/19>> negative/final - urine culture 09/04/19>>negative/final - blood cultures  09/04/19>>pending  Anticoagulation Plan: -INR Goal: 2.0 - 2.5  -ASA Dose: none  Adverse Events: - driveline cx 04/23/19>> staph aureus - Admitted for sternal/drive line infection. OR for debridement 08/20/19- + staph aureus wound cultures; further irrigation/debridements done 08/22/19, 08/24/19, 08/30/19    Plan/Recommendations:  1. Call VAD pager if any VAD equipment or drive line issues. 2. Weekly dressing changes by bedside nurse.      RN VAD Coordinator  Office: [phone]  24/7 Pager: [phone]       

## 2019-09-06 NOTE — Progress Notes (Signed)
ANTICOAGULATION CONSULT NOTE - Follow Up Consult  Pharmacy Consult for warfarin + heparin Indication: LVAD  No Known Allergies  Patient Measurements: Height: 6\' 2"  (188 cm) Weight: 99.3 kg (218 lb 14.7 oz) IBW/kg (Calculated) : 82.2  Vital Signs: Temp: 98 F (36.7 C) (04/22 1508) BP: 101/73 (04/22 1800) Pulse Rate: 80 (04/22 1800)  Labs: Recent Labs    09/04/19 0424 09/04/19 0424 09/05/19 0418 09/05/19 0418 09/05/19 1603 09/06/19 0354 09/06/19 0355 09/06/19 0743 09/06/19 1658  HGB 8.1*   < > 9.4*   < > 10.5*  --  8.8*  --   --   HCT 26.6*   < > 30.8*  --  31.0*  --  29.0*  --   --   PLT 250  --  219  --   --   --  215  --   --   LABPROT 17.2*  --  18.0*  --   --   --  17.3*  --   --   INR 1.4*  --  1.5*  --   --   --  1.4*  --   --   HEPARINUNFRC <0.10*   < > <0.10*  --   --  <0.10*  --   --  0.18*  CREATININE 1.81*   < > 1.53*  --   --   --  1.40* 1.36*  --    < > = values in this interval not displayed.    Estimated Creatinine Clearance: 85.4 mL/min (A) (by C-G formula based on SCr of 1.36 mg/dL (H)).  Assessment: 48yom with HF s/p LVAD implant HM3 place 4/19 admitted with drive line infection. S/p OR on 4/15 for repeat I&D with application of wound vac.   Warfarin PTA 7.5mg  MW and 5mg  all other days admit INR elevated at 5 - probably acute infection related supra-sensitive.   Warfarin has been on hold since 4/19. Heparin was started on 4/18 with INR <2 and with plans to return to OR on 4/21.  Now back from the OR. INR is 1.4. CBC stable, no bleeding noted. After discussing with Dr. 5/18, resumed heparin and warfarin today.  Initial hep lvl 0.18 - goal per Dr 5/21 is 0.2  Goal of Therapy:  INR 2-2.5 Monitor platelets by anticoagulation protocol: Yes   Plan:  -continue heparin at 700 units/hr -Will discuss with MD prior to rate increases -Warfarin 5 mg x 1 today -Daily INR, CBC, heparin level  Donata Clay, PharmD, BCPS, BCCCP Clinical  Pharmacist [phone]  Please check AMION for all [facility] Pharmacy numbers  09/06/2019 6:56 PM

## 2019-09-06 NOTE — TOC Progression Note (Signed)
Transition of Care [facility]) - Progression Note    Patient Details  Name: Jonathan Wood MRN: [id number] Date of Birth: [birth year]  [facility]) CM/SW Contact  Leone Haven, RN Phone Number: 09/06/2019, 1:59 PM  Clinical Narrative:    NCM spoke with French Ana at [facility] , they will bring wound vac to patient room today,  This NCM informed other NCM on 2H Marylene Land that the wound vac will be delivered to patient room today.        Expected Discharge Plan and Services                                                 Social Determinants of Health (SDOH) Interventions    Readmission Risk Interventions No flowsheet data found.

## 2019-09-06 NOTE — Progress Notes (Signed)
1 Day Post-Op Procedure(s) (LRB): DEBRIDEMENT AND CLOSURE OF ABDOMINAL WOUND (N/A) MUSCLE FLAP CLOSURE (N/A) Application Of A-Cell Of Chest/Abdomen (N/A) Subjective: Doing well after debridement and left pectoralis muscle flap coverage of proximal driveline tunnel wound. Minimal JP drainage Chest x-ray clear Weight increased, patient receiving IV Lasix VAD flow satisfactory INR 1.4, heparin bridge has started and will also begin redosing Coumadin Mobilize patient out of bed advance diet slowly and continue PCA for pain control Change if preoperative cultures remain negative.  White blood count 13 K Objective: Vital signs in last 24 hours: Temp:  [97.3 F (36.3 C)-98.6 F (37 C)] 97.7 F (36.5 C) (04/22 1101) Pulse Rate:  [40-100] 85 (04/22 1100) Cardiac Rhythm: Heart block (04/22 0800) Resp:  [15-30] 23 (04/22 1100) BP: (84-128)/(63-110) 110/87 (04/22 0800) SpO2:  [85 %-100 %] 97 % (04/22 1100) Weight:  [99.3 kg] 99.3 kg (04/22 0500)  Hemodynamic parameters for last 24 hours: CVP:  [13 mmHg-35 mmHg] 13 mmHg  Intake/Output from previous day: 04/21 0701 - 04/22 0700 In: 1803.3 [I.V.:823.9; Blood:195; IV Piggyback:784.4] Out: 2215 [Urine:1750; Drains:165; Blood:300] Intake/Output this shift: Total I/O In: 190.8 [I.V.:190.8] Out: 1030 [Urine:1000; Drains:30]  Exam Alert and responsive Lungs clear Normal VAD hum Wound VAC sponge compressed Minimal serosanguineous drainage in JP bulbs  Lab Results: Recent Labs    09/05/19 0418 09/05/19 0418 09/05/19 1603 09/06/19 0355  WBC 11.9*  --   --  13.0*  HGB 9.4*   < > 10.5* 8.8*  HCT 30.8*   < > 31.0* 29.0*  PLT 219  --   --  215   < > = values in this interval not displayed.   BMET:  Recent Labs    09/06/19 0355 09/06/19 0743  NA 130* 131*  K 5.7* 5.0  CL 95* 94*  CO2 26 27  GLUCOSE 128* 133*  BUN 36* 36*  CREATININE 1.40* 1.36*  CALCIUM 9.2 9.1    PT/INR:  Recent Labs    09/06/19 0355  LABPROT 17.3*   INR 1.4*   ABG    Component Value Date/Time   PHART 7.405 09/08/2017 0049   HCO3 29.3 (H) 09/05/2019 1603   TCO2 31 09/05/2019 1603   ACIDBASEDEF 1.0 09/08/2017 0049   O2SAT 59.8 09/06/2019 0756   CBG (last 3)  Recent Labs    09/06/19 0343 09/06/19 0720 09/06/19 1059  GLUCAP 117* 117* 142*    Assessment/Plan: S/P Procedure(s) (LRB): DEBRIDEMENT AND CLOSURE OF ABDOMINAL WOUND (N/A) MUSCLE FLAP CLOSURE (N/A) Application Of A-Cell Of Chest/Abdomen (N/A) Heparin bridge while Coumadin is being resumed Since patient is postop we will slowly increase heparin rate and follow heparin level, goal 0.2 for the first 48 hours postop   LOS: 21 days    Kathlee Nations Trigt III 09/06/2019

## 2019-09-07 ENCOUNTER — Inpatient Hospital Stay (HOSPITAL_COMMUNITY): Payer: Medicare Other

## 2019-09-07 LAB — COMPREHENSIVE METABOLIC PANEL
ALT: 7 U/L (ref 0–44)
AST: 21 U/L (ref 15–41)
Albumin: 2.9 g/dL — ABNORMAL LOW (ref 3.5–5.0)
Alkaline Phosphatase: 105 U/L (ref 38–126)
Anion gap: 11 (ref 5–15)
BUN: 33 mg/dL — ABNORMAL HIGH (ref 6–20)
CO2: 27 mmol/L (ref 22–32)
Calcium: 9.2 mg/dL (ref 8.9–10.3)
Chloride: 93 mmol/L — ABNORMAL LOW (ref 98–111)
Creatinine, Ser: 1.3 mg/dL — ABNORMAL HIGH (ref 0.61–1.24)
GFR calc Af Amer: >60 mL/min (ref >60)
GFR calc non Af Amer: >60 mL/min (ref >60)
Glucose, Bld: 109 mg/dL — ABNORMAL HIGH (ref 70–99)
Potassium: 4.2 mmol/L (ref 3.5–5.1)
Sodium: 131 mmol/L — ABNORMAL LOW (ref 135–145)
Total Bilirubin: 1.2 mg/dL (ref 0.3–1.2)
Total Protein: 7.4 g/dL (ref 6.5–8.1)

## 2019-09-07 LAB — GLUCOSE, CAPILLARY
Glucose-Capillary: 84 mg/dL (ref 70–99)
Glucose-Capillary: 154 mg/dL — ABNORMAL HIGH (ref 70–99)
Glucose-Capillary: 101 mg/dL — ABNORMAL HIGH (ref 70–99)
Glucose-Capillary: 93 mg/dL (ref 70–99)
Glucose-Capillary: 117 mg/dL — ABNORMAL HIGH (ref 70–99)

## 2019-09-07 LAB — COOXEMETRY PANEL
Carboxyhemoglobin: 2.7 % — ABNORMAL HIGH (ref 0.5–1.5)
Methemoglobin: 1.1 % (ref 0.0–1.5)
O2 Saturation: 53.5 %
Total hemoglobin: 8.8 g/dL — ABNORMAL LOW (ref 12.0–16.0)

## 2019-09-07 LAB — CBC
HCT: 29 % — ABNORMAL LOW (ref 39.0–52.0)
Hemoglobin: 8.7 g/dL — ABNORMAL LOW (ref 13.0–17.0)
MCH: 29.5 pg (ref 26.0–34.0)
MCHC: 30 g/dL (ref 30.0–36.0)
MCV: 98.3 fL (ref 80.0–100.0)
Platelets: 190 10*3/uL (ref 150–400)
RBC: 2.95 MIL/uL — ABNORMAL LOW (ref 4.22–5.81)
RDW: 18 % — ABNORMAL HIGH (ref 11.5–15.5)
WBC: 11.4 10*3/uL — ABNORMAL HIGH (ref 4.0–10.5)
nRBC: 0 % (ref 0.0–0.2)

## 2019-09-07 LAB — PROTIME-INR
INR: 1.8 — ABNORMAL HIGH (ref 0.8–1.2)
Prothrombin Time: 20.5 seconds — ABNORMAL HIGH (ref 11.4–15.2)

## 2019-09-07 LAB — LACTATE DEHYDROGENASE: LDH: 145 U/L (ref 98–192)

## 2019-09-07 LAB — HEPARIN LEVEL (UNFRACTIONATED): Heparin Unfractionated: 0.18 IU/mL — ABNORMAL LOW (ref 0.30–0.70)

## 2019-09-07 MED ORDER — FUROSEMIDE 10 MG/ML IJ SOLN
40.0000 mg | Freq: Three times a day (TID) | INTRAMUSCULAR | Status: DC
  Filled 2019-09-07: qty 4

## 2019-09-07 MED ORDER — WARFARIN SODIUM 2.5 MG PO TABS
2.5000 mg | ORAL_TABLET | Freq: Once | ORAL | Status: AC
  Administered 2019-09-07: 18:00:00 2.5 mg via ORAL
  Filled 2019-09-07: qty 1

## 2019-09-07 MED ORDER — FUROSEMIDE 10 MG/ML IJ SOLN
40.0000 mg | Freq: Once | INTRAMUSCULAR | Status: AC
  Administered 2019-09-07: 40 mg via INTRAVENOUS

## 2019-09-07 MED ORDER — FUROSEMIDE 10 MG/ML IJ SOLN: 40.0000 mg | Freq: Once | INTRAMUSCULAR | Status: DC

## 2019-09-07 NOTE — Progress Notes (Addendum)
LVAD Coordinator Rounding Note:  Admitted 08/16/19 by Dr. [name] due to VAD drive line infection/pneumonia/sepsis  HM III LVAD implanted on 09/06/17 by Dr. Cyndia Bent under Destination Therapy criteria.  Pt laying in bed resting. States he walked 'the entire unit" this morning. Reports pain is 6/10, but Fentanyl PCA controls pain.   Montgomery straps remain in place over sternal wound; wound vac in place with bloody drainage.   Vital signs: Temp: 98.8 HR: 98 Auto BP: 108/91 (98) Doppler: 84 Sat: 98% on RA Wt: 227.5>231>235>233.9>236.3.....216>220>219>216>213>213>214.9>218.9>216.4 lbs   LVAD interrogation reveals:  Speed: 5600 Flow: 5.2 Power:  4.2w PI: 2.6 Alarms: none Events: rare PI Hematocrit: 27  Fixed speed: 5600 Low speed limit: 5300  Drive Line:  Left abdominal drive line dressing C/D/I with anchor unattached. Anchor replaced. Weekly dressing changes per BS nurse.   Sternal wound: dressing with montgomery straps in place; wound vac attached to suction.  No alarms noted.   Blood products: - 09/04/19>>1 PCs - 09/05/19>>2 FFP  Labs:  LDH trend: 227>230>271........235>573>220>254>270>623>762>831>517  INR trend: 4.5>5.0>1.7>1.7>1.7>1.8>1.7>2.1>3.4>2.5>2.7>2.1>1.4>1.5>1.4>1.5>1.4>1.8    WBC: 27.3>26.2>24>34.6>25.8>20.8......13.0>12.4>14.8>19.3>11.9>13.0  Alk Phos: 138> 177>123  AST: 53> 154>33  ALT: 17> 35>13  Infection: - blood cultures 08/16/19>>staphylococcus aureus - blood cultures 08/18/19>> NGTD - Drive line tunnel / sternum 08/20/19>> abundant WBC, gram + cocci, culture- few staph aureus - Drive line AFB 6/1/607>> pending - Drive line/Sternal Fungus culture 08/20/19>> negative - blood cultures 09/02/19>>negative/final - body fluid culture 08/28/19>> negative/final - urine culture 09/04/19>>negative/final - blood cultures 09/04/19>> NG x 2 days; final pending  Anticoagulation Plan: -INR Goal: 2.0 - 2.5  -ASA Dose: none  Adverse Events: - driveline cx  37/1/06>> staph aureus - Admitted for sternal/drive line infection. OR for debridement 08/20/19- + staph aureus wound cultures; further irrigation/debridements done 08/22/19, 08/24/19, 08/30/19, and 09/03/19. Debridement and closure of sternal wound with muscle flap 09/05/19.     Plan/Recommendations:  1. Call VAD pager if any VAD equipment or drive line issues. 2. Weekly drive line dressing changes by bedside nurse.   Emerson Monte RN [place] Coordinator  Office: [phone]  24/7 Pager: [phone] '

## 2019-09-07 NOTE — TOC Progression Note (Signed)
Transition of Care [facility]) - Progression Note    Patient Details  Name: Jonathan Wood MRN: [id number] Date of Birth: Jan 29, 1971  Transition [facility] Care [facility]) CM/SW Contact  Leone Haven, RN Phone Number: 09/07/2019, 2:09 PM  Clinical Narrative:    Patient was transferred from [facility], was to have the wound vac with him when he came to [facility]. Barb Staff RN is checking on it.        Expected Discharge Plan and Services                                                 Social Determinants of Health (SDOH) Interventions    Readmission Risk Interventions No flowsheet data found.

## 2019-09-07 NOTE — Progress Notes (Signed)
Pt refuses CPAP for tonight.   

## 2019-09-07 NOTE — Progress Notes (Signed)
Patient ID: Jonathan Wood, male   DOB: 11-08-1970, 49 y.o.   MRN: [id number]     Advanced Heart Failure Rounding Note  PCP-Cardiologist: [name], MD    Patient Profile   49 y.o. 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  Subjective:    - 4/5 S/p I&D w/ excisional debridement of subxiphoid abscess. Cultures + Staph Aureus  - 4/7, Returned to OR for debridement, Irrigation and Packing of Abdominal Incision - 4/9 S/P I&D abd wound with Acell and VAC application.  - 4/15 s/p I&D wound with VAC application.  - 4/19 S/P I&D with VAC application.  - 4/21 S/P reconstructive surgery with pec flap. Received steroids in the OR.   Switched to cefepime on 4/20 for WBC 20k. WBC down 11.3   Yesterday diuresed with IV lasix. Negative 1.3 liters. Weight down 2 pounds.   Co-ox 53.5%.   Denies SOB. Pain better controlled this morning.  HM3 VAD Interrogation: Speed:5600 Flow:5 Power:3   PI: 2.1  No PIs.  VAD interrogated personally. Parameters stable.  Objective:   Weight Range: 98.2 kg Body mass index is 27.8 kg/m.   Vital Signs:   Temp:  [97.7 F (36.5 C)-98.8 F (37.1 C)] 98.8 F (37.1 C) (04/23 0750) Pulse Rate:  [80-108] 108 (04/23 0700) Resp:  [14-30] 15 (04/23 0700) BP: (88-115)/(67-96) 112/92 (04/23 0700) SpO2:  [94 %-100 %] 99 % (04/23 0700) Weight:  [98.2 kg] 98.2 kg (04/23 0500) Last BM Date: 09/06/19  Weight change: Filed Weights   09/05/19 0337 09/06/19 0500 09/07/19 0500  Weight: 97.5 kg 99.3 kg 98.2 kg    Intake/Output:   Intake/Output Summary (Last 24 hours) at 09/07/2019 0845 Last data filed at 09/07/2019 0700 Gross per 24 hour  Intake 904.46 ml  Output 2035 ml  Net -1130.54 ml    Telemetry: SR 80-90s   Physical Exam  CVP 12-13 personally checked.  Marland KitchenPhysical Exam: GENERAL: Sitting  the chair.  WHEENT: normal  NECK: Supple, JVP 11-12. Carotids  2+ bilaterally, no bruits.  No lymphadenopathy or thyromegaly appreciated.   CARDIAC:  Mechanical heart sounds with LVAD hum present.  LUNGS:  Clear to auscultation bilaterally.  ABDOMEN:  VAC dressing intact. Round, distended, nontender, positive bowel sounds x4.     LVAD exit site: Dressing dry and intact.  No erythema or drainage.  Stabilization device present and accurately applied.  Driveline dressing is being changed daily per sterile technique. EXTREMITIES:  Warm and dry, no cyanosis, clubbing, rash or edema. PICC RUE NEUROLOGIC:  Alert and oriented x 4.  No aphasia.  No dysarthria.  Affect pleasant.       Labs    CBC Recent Labs    09/06/19 0355 09/07/19 0207  WBC 13.0* 11.4*  HGB 8.8* 8.7*  HCT 29.0* 29.0*  MCV 95.7 98.3  PLT 215 824   Basic Metabolic Panel Recent Labs    09/05/19 0418 09/05/19 1603 09/06/19 0355 09/06/19 0355 09/06/19 0743 09/07/19 0207  NA 134*   < > 130*   < > 131* 131*  K 3.4*   < > 5.7*   < > 5.0 4.2  CL 95*  --  95*   < > 94* 93*  CO2 29  --  26   < > 27 27  GLUCOSE 126*  --  128*   < >  133* 109*  BUN 43*  --  36*   < > 36* 33*  CREATININE 1.53*  --  1.40*   < > 1.36* 1.30*  CALCIUM 8.4*  --  9.2   < > 9.1 9.2  MG 1.5*  --  2.4  --   --   --    < > = values in this interval not displayed.   Liver Function Tests Recent Labs    09/07/19 0207  AST 21  ALT 7  ALKPHOS 105  BILITOT 1.2  PROT 7.4  ALBUMIN 2.9*   No results for input(s): LIPASE, AMYLASE in the last 72 hours. Cardiac Enzymes No results for input(s): CKTOTAL, CKMB, CKMBINDEX, TROPONINI in the last 72 hours.  BNP: BNP (last 3 results) Recent Labs    03/21/19 1415 04/23/19 1350  BNP 426.9* 709.6*    ProBNP (last 3 results) No results for input(s): PROBNP in the last 8760 hours.   D-Dimer No results for input(s): DDIMER in the last 72 hours. Hemoglobin A1C No results for input(s): HGBA1C in the  last 72 hours. Fasting Lipid Panel No results for input(s): CHOL, HDL, LDLCALC, TRIG, CHOLHDL, LDLDIRECT in the last 72 hours. Thyroid Function Tests No results for input(s): TSH, T4TOTAL, T3FREE, THYROIDAB in the last 72 hours.  Invalid input(s): FREET3  Other results:   Imaging    No results found.   Medications:     Scheduled Medications: . sodium chloride   Intravenous Once  . acetaminophen  1,000 mg Oral Q6H   Or  . acetaminophen (TYLENOL) oral liquid 160 mg/5 mL  1,000 mg Oral Q6H  . amiodarone  200 mg Oral BID  . vitamin C  250 mg Oral Daily  . bisacodyl  10 mg Oral Daily  . Chlorhexidine Gluconate Cloth  6 each Topical Daily  . docusate sodium  100 mg Oral TID  . DULoxetine  30 mg Oral Daily  . feeding supplement (ENSURE ENLIVE)  237 mL Oral TID BM  . feeding supplement (PRO-STAT SUGAR FREE 64)  30 mL Oral BID  . fentaNYL   Intravenous Q4H  . ferrous sulfate  325 mg Oral Q breakfast  . furosemide  40 mg Intravenous Q8H  . gabapentin  300 mg Oral TID  . hydrALAZINE  75 mg Oral TID  . insulin aspart  0-24 Units Subcutaneous Q4H  . linaclotide  145 mcg Oral Daily  . mouth rinse  15 mL Mouth Rinse BID  . metoCLOPramide (REGLAN) injection  10 mg Intravenous Q6H  . multivitamin with minerals  1 tablet Oral Daily  . pantoprazole  40 mg Oral BID  . rifabutin  300 mg Oral Daily  . senna-docusate  1 tablet Oral QHS  . sildenafil  20 mg Oral TID  . sodium chloride flush  10-40 mL Intracatheter Q12H  . sodium chloride flush  3 mL Intravenous Q12H  . spironolactone  12.5 mg Oral Daily  . warfarin  2.5 mg Oral ONCE-1600  . Warfarin - Pharmacist Dosing Inpatient   Does not apply q1600  . zinc sulfate  220 mg Oral Daily    Infusions: . sodium chloride 10 mL/hr at 08/30/19 1000  . sodium chloride    . sodium chloride Stopped (09/06/19 1456)  . sodium chloride 10 mL/hr at 09/07/19 0700  . ceFEPime (MAXIPIME) IV Stopped (09/06/19 2244)  . dextrose 5 % and 0.45% NaCl  Stopped (08/25/19 1414)  . lactated ringers Stopped (09/05/19 1651)    PRN Medications:  sodium chloride, Place/Maintain arterial line **AND** sodium chloride, sodium chloride, sodium chloride, acetaminophen, albuterol, alum & mag hydroxide-simeth, bisacodyl **OR** bisacodyl, diphenhydrAMINE **OR** diphenhydrAMINE, fentaNYL (SUBLIMAZE) injection, guaiFENesin-dextromethorphan, HYDROmorphone (DILAUDID) injection, hydrOXYzine, magnesium hydroxide, naloxone **AND** sodium chloride flush, ondansetron (ZOFRAN) IV, ondansetron (ZOFRAN) IV, oxyCODONE, sodium chloride flush, sodium chloride flush, traMADol    Assessment/Plan    1. Fever & leukocytosis -> MSSA bacteremia/ Subxiphoid Abscess  - bcx 2/2 MSSA - ID has changed antibiotics to cefazolin, now has PICC.   - WBC trending down.  Now 13.  Afebrile. - He is not a candidate for VAD exchange due to severe RV failure and noncompliance.  - S/p I&D on 4/5. Wound cultures +MSSA.  - Back to OR 4/7, 4/9, 4/15 , 4/19 debridement and irrigation of driveline + packing, wound vac in place.    -S/P reconstructive surgery with pec flap 4/21 - ID recommends 6 weeks of IV cefazolin followed by long-term suppressive oral cephalexin. Switched to cefepime on 4/19   - He has PICC line. 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  S/p HM-3 LVAD on 09/06/17 - On sildenafil for RV failure.  - CVP 12-13. Give 40 mg IV lasix bid today.  - - Continue current dose of hydralazine and sildenafil. No imdur with sildenafil. MAPs in the 90-100s   3. HM3 LVAD 09/06/2017. - VAD interrogated personally. Parameters stable. - LDH 145  today  - INR 1.8.  - On  Warfarin and heparin on hold post op. Dr Synetta Fail managing anticoagulants.  - Off ASA with PUD.    4. Essential HTN - Maps elevated.  - Continue hydralazine 75 mg tid. - Continue sildenafil 20 mg tid     5. AKI - Creatinine improved with IVF and  diuretic hold, 1.8>>1.5>>1.4>>1.3   - Renal function stable today.   6. OSA - sleep study with very severe OSA (AHI 69/hr) - non-compliant Bipap in the past, has refused to use.  - He continues to tell respiratory that he will wear the machine himself.   - He is only using intermittently.   7. H/o GI bleed  - 08/2018 EGD completed and showed gastritis and nonbleeding duodenal ulcers, colonoscopy with 5 polyps removed. Protonix was increased to BID.  - No recent GI bleeding - hgb 8.7 stable. Expected surgical blood loss) - Transfuse hgb < 7.  - He will stay off aspirin for now.   8. Tobacco Abuse  - says he quit recently  9. Severe protein calorie malnutrition  - Albumin 2.1 on 4/14 - nutrition seeing   10 Hyperkalemia  -Stable today  12. Hypomagnesium - resolved. Mg 2.4 today   Continue to ambulate.   Length of Stay: 22  Arvilla Meres, MD  09/07/2019, 8:45 AM  Advanced Heart Failure Team Pager [phone] (M-F; 7a - 4p)  Please contact [facility] for night-coverage after hours (4p -7a ) and weekends on [email or web address] 8:45 AM   Patient seen and examined with the above-signed Advanced Practice Provider and/or Housestaff. I personally reviewed laboratory data, imaging studies and relevant notes. I independently examined the patient and formulated the important aspects of the plan. I have edited the note to reflect any of my changes or salient points. I have personally discussed the plan with the patient and/or family.  Pain control improved but still sore. Says Dilaudid works well for him. CVP still up and co-ox low in setting of severe  RV failure. Ok with IV lasix today but need to avoid overdiuresis with RV failure. VAD interrogated personally. Parameters stable. On heparin. INR 1.8. Can stop heparin today. Discussed with PharmD personally.  General:  Sitting up in bed NAD.  HEENT: normal  Neck: supple. JVP to jawCarotids 2+ bilat; no bruits. No lymphadenopathy or  thryomegaly appreciated. Cor: LVAD hum.  Lungs: Clear. Abdomen: wound vac in epigastrum ok obese soft, minimally tender, non-distended. No hepatosplenomegaly. No bruits or masses. Good bowel sounds. Driveline site clean. Anchor in place.  Extremities: no cyanosis, clubbing, rash. Warm tr-1+ edema  Neuro: alert & oriented x 3. No focal deficits. Moves all 4 without problem   Adjust pain meds. Diurese carefully. Follow co-ox. Can stop heparin. Continue warfarin. Likely ok for 2C soon.   Arvilla Meres, MD  8:48 AM

## 2019-09-07 NOTE — Progress Notes (Signed)
Patient refused  HS CPAP at this time. Patient aware to let staff know if he desires to use CPAP. On stand-by at bedside.

## 2019-09-07 NOTE — Progress Notes (Addendum)
ANTICOAGULATION CONSULT NOTE - Follow Up Consult  Pharmacy Consult for warfarin + heparin Indication: LVAD  No Known Allergies  Patient Measurements: Height: 6\' 2"  (188 cm) Weight: 98.2 kg (216 lb 7.9 oz) IBW/kg (Calculated) : 82.2  Vital Signs: Temp: 98.3 F (36.8 C) (04/23 0400) Temp Source: Oral (04/23 0400) BP: 112/92 (04/23 0700) Pulse Rate: 108 (04/23 0700)  Labs: Recent Labs    09/05/19 0418 09/05/19 0418 09/05/19 1603 09/05/19 1603 09/06/19 0354 09/06/19 0355 09/06/19 0743 09/06/19 1658 09/07/19 0207  HGB 9.4*   < > 10.5*   < >  --  8.8*  --   --  8.7*  HCT 30.8*   < > 31.0*  --   --  29.0*  --   --  29.0*  PLT 219  --   --   --   --  215  --   --  190  LABPROT 18.0*  --   --   --   --  17.3*  --   --  20.5*  INR 1.5*  --   --   --   --  1.4*  --   --  1.8*  HEPARINUNFRC <0.10*   < >  --   --  <0.10*  --   --  0.18* 0.18*  CREATININE 1.53*   < >  --   --   --  1.40* 1.36*  --  1.30*   < > = values in this interval not displayed.    Estimated Creatinine Clearance: 82.6 mL/min (A) (by C-G formula based on SCr of 1.3 mg/dL (H)).   Medical History: Past Medical History:  Diagnosis Date  . Asthma   . CHF (congestive heart failure) (HCC)    a. 09/2016: EF 20-25% with cath showing normal cors  . GERD (gastroesophageal reflux disease)   . History of hiatal hernia   . LVAD (left ventricular assist device) present (HCC)   . OSA on CPAP 09/06/2018   Severe OSA with AHI 68/hr on CPAP at 12cm H2O    Assessment: Jonathan Wood with HF s/p LVAD implant HM3 place 4/19 admitted with drive line infection. S/p OR on 4/15 for repeat I&D with application of wound vac.   Warfarin PTA 7.5mg  MW and 5mg  all other days admit INR elevated at 5 - probably acute infection related supra-sensitive.   Warfarin has been on hold since 4/19. Heparin was started on 4/18 with INR <2 and with plans to return to OR on 4/21. Both heparin and warfarin resumed post-OR on 4/22 after discussing with  Dr. 5/21.  INR today is 1.8. Heparin level remains 0.18. CBC stable, 1 unit of FFP yesterday, no bleeding noted.   Goal of Therapy:  INR 2-2.5 Monitor platelets by anticoagulation protocol: Yes   Plan:  -stop heparin per MD -Warfarin 2.5 mg x 1 today -Daily INR, CBC, heparin level while on heparin  5/22, PharmD, [facility] PGY2 Cardiology Pharmacy Resident Phone [phone] 09/07/2019       7:19 AM  Please check [email or web address] for unit-specific pharmacist phone numbers

## 2019-09-07 NOTE — Progress Notes (Addendum)
Patient ID: Jonathan Wood, male   DOB: 04/02/1971, 49 y.o.   MRN: [id number]     Advanced Heart Failure Rounding Note  PCP-Cardiologist: [name], MD    Patient Profile   49 y.o. 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  Subjective:    - 4/5 S/p I&D w/ excisional debridement of subxiphoid abscess. Cultures + Staph Aureus  - 4/7, Returned to OR for debridement, Irrigation and Packing of Abdominal Incision - 4/9 S/P I&D abd wound with Acell and VAC application.  - 4/15 s/p I&D wound with VAC application.  - 4/19 S/P I&D with VAC application.  - 4/21 S/P reconstructive surgery with pec flap. Received steroids in the OR.   Switched to cefepime on 4/20 for WBC 20k. WBC down 11.3   Yesterday diuresed with IV lasix. Negative 1.3 liters. Weight down 2 pounds.   Co-ox 53.5%.   Denies SOB. Pain better controlled this morning.  HM3 VAD Interrogation: Speed:5600 Flow:5 Power:3   PI: 2.1  No PIs.  VAD interrogated personally. Parameters stable.  Objective:   Weight Range: 98.2 kg Body mass index is 27.8 kg/m.   Vital Signs:   Temp:  [97.7 F (36.5 C)-98.6 F (37 C)] 98.3 F (36.8 C) (04/23 0400) Pulse Rate:  [80-108] 108 (04/23 0700) Resp:  [14-30] 15 (04/23 0700) BP: (88-115)/(67-96) 112/92 (04/23 0700) SpO2:  [94 %-100 %] 99 % (04/23 0700) Weight:  [98.2 kg] 98.2 kg (04/23 0500) Last BM Date: 09/06/19  Weight change: Filed Weights   09/05/19 0337 09/06/19 0500 09/07/19 0500  Weight: 97.5 kg 99.3 kg 98.2 kg    Intake/Output:   Intake/Output Summary (Last 24 hours) at 09/07/2019 2694 Last data filed at 09/07/2019 0700 Gross per 24 hour  Intake 1320.11 ml  Output 2365 ml  Net -1044.89 ml    Telemetry: SR 80-90s   Physical Exam  CVP 12-13 personally checked.  Marland KitchenPhysical Exam: GENERAL: Sitting  the chair.  WHEENT: normal  NECK: Supple, JVP 11-12. Carotids  2+ bilaterally, no bruits.  No lymphadenopathy or thyromegaly appreciated.   CARDIAC:  Mechanical heart sounds with LVAD hum present.  LUNGS:  Clear to auscultation bilaterally.  ABDOMEN:  VAC dressing intact. Round, distended, nontender, positive bowel sounds x4.     LVAD exit site: Dressing dry and intact.  No erythema or drainage.  Stabilization device present and accurately applied.  Driveline dressing is being changed daily per sterile technique. EXTREMITIES:  Warm and dry, no cyanosis, clubbing, rash or edema. PICC RUE NEUROLOGIC:  Alert and oriented x 4.  No aphasia.  No dysarthria.  Affect pleasant.       Labs    CBC Recent Labs    09/06/19 0355 09/07/19 0207  WBC 13.0* 11.4*  HGB 8.8* 8.7*  HCT 29.0* 29.0*  MCV 95.7 98.3  PLT 215 854   Basic Metabolic Panel Recent Labs    09/05/19 0418 09/05/19 1603 09/06/19 0355 09/06/19 0355 09/06/19 0743 09/07/19 0207  NA 134*   < > 130*   < > 131* 131*  K 3.4*   < > 5.7*   < > 5.0 4.2  CL 95*  --  95*   < > 94* 93*  CO2 29  --  26   < > 27 27  GLUCOSE 126*  --  128*   < >  133* 109*  BUN 43*  --  36*   < > 36* 33*  CREATININE 1.53*  --  1.40*   < > 1.36* 1.30*  CALCIUM 8.4*  --  9.2   < > 9.1 9.2  MG 1.5*  --  2.4  --   --   --    < > = values in this interval not displayed.   Liver Function Tests Recent Labs    09/07/19 0207  AST 21  ALT 7  ALKPHOS 105  BILITOT 1.2  PROT 7.4  ALBUMIN 2.9*   No results for input(s): LIPASE, AMYLASE in the last 72 hours. Cardiac Enzymes No results for input(s): CKTOTAL, CKMB, CKMBINDEX, TROPONINI in the last 72 hours.  BNP: BNP (last 3 results) Recent Labs    03/21/19 1415 04/23/19 1350  BNP 426.9* 709.6*    ProBNP (last 3 results) No results for input(s): PROBNP in the last 8760 hours.   D-Dimer No results for input(s): DDIMER in the last 72 hours. Hemoglobin A1C No results for input(s): HGBA1C in the  last 72 hours. Fasting Lipid Panel No results for input(s): CHOL, HDL, LDLCALC, TRIG, CHOLHDL, LDLDIRECT in the last 72 hours. Thyroid Function Tests No results for input(s): TSH, T4TOTAL, T3FREE, THYROIDAB in the last 72 hours.  Invalid input(s): FREET3  Other results:   Imaging    No results found.   Medications:     Scheduled Medications: . sodium chloride   Intravenous Once  . acetaminophen  1,000 mg Oral Q6H   Or  . acetaminophen (TYLENOL) oral liquid 160 mg/5 mL  1,000 mg Oral Q6H  . amiodarone  200 mg Oral BID  . vitamin C  250 mg Oral Daily  . bisacodyl  10 mg Oral Daily  . Chlorhexidine Gluconate Cloth  6 each Topical Daily  . docusate sodium  100 mg Oral TID  . DULoxetine  30 mg Oral Daily  . feeding supplement (ENSURE ENLIVE)  237 mL Oral TID BM  . feeding supplement (PRO-STAT SUGAR FREE 64)  30 mL Oral BID  . fentaNYL   Intravenous Q4H  . ferrous sulfate  325 mg Oral Q breakfast  . gabapentin  300 mg Oral TID  . hydrALAZINE  75 mg Oral TID  . insulin aspart  0-24 Units Subcutaneous Q4H  . linaclotide  145 mcg Oral Daily  . mouth rinse  15 mL Mouth Rinse BID  . metoCLOPramide (REGLAN) injection  10 mg Intravenous Q6H  . multivitamin with minerals  1 tablet Oral Daily  . pantoprazole  40 mg Oral BID  . rifabutin  300 mg Oral Daily  . senna-docusate  1 tablet Oral QHS  . sildenafil  20 mg Oral TID  . sodium chloride flush  10-40 mL Intracatheter Q12H  . sodium chloride flush  3 mL Intravenous Q12H  . spironolactone  12.5 mg Oral Daily  . Warfarin - Pharmacist Dosing Inpatient   Does not apply q1600  . zinc sulfate  220 mg Oral Daily    Infusions: . sodium chloride 10 mL/hr at 08/30/19 1000  . sodium chloride    . sodium chloride Stopped (09/06/19 1456)  . sodium chloride 10 mL/hr at 09/07/19 0700  . ceFEPime (MAXIPIME) IV Stopped (09/06/19 2244)  . dextrose 5 % and 0.45% NaCl Stopped (08/25/19 1414)  . heparin 700 Units/hr (09/07/19 0700)  .  lactated ringers Stopped (09/05/19 1651)  . phenylephrine (NEO-SYNEPHRINE) Adult infusion      PRN Medications: sodium  chloride, Place/Maintain arterial line **AND** sodium chloride, sodium chloride, sodium chloride, acetaminophen, albuterol, alum & mag hydroxide-simeth, bisacodyl **OR** bisacodyl, diphenhydrAMINE **OR** diphenhydrAMINE, fentaNYL (SUBLIMAZE) injection, guaiFENesin-dextromethorphan, HYDROmorphone (DILAUDID) injection, hydrOXYzine, magnesium hydroxide, naloxone **AND** sodium chloride flush, ondansetron (ZOFRAN) IV, ondansetron (ZOFRAN) IV, oxyCODONE, sodium chloride flush, sodium chloride flush, traMADol    Assessment/Plan    1. Fever & leukocytosis -> MSSA bacteremia/ Subxiphoid Abscess  - bcx 2/2 MSSA - ID has changed antibiotics to cefazolin, now has PICC.   - WBC trending down.  Now 13.  Afebrile. - He is not a candidate for VAD exchange due to severe RV failure and noncompliance.  - S/p I&D on 4/5. Wound cultures +MSSA.  - Back to OR 4/7, 4/9, 4/15 , 4/19 debridement and irrigation of driveline + packing, wound vac in place.    -S/P reconstructive surgery with pec flap 4/21 - ID recommends 6 weeks of IV cefazolin followed by long-term suppressive oral cephalexin. Switched to cefepime on 4/19   - He has PICC line. 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  S/p HM-3 LVAD on 09/06/17 - On sildenafil for RV failure.  - CVP 12-13. Give 40 mg IV lasix bid today.  - - Continue current dose of hydralazine and sildenafil. No imdur with sildenafil. MAPs in the 90-100s   3. HM3 LVAD 09/06/2017. - VAD interrogated personally. Parameters stable. - LDH 145  today  - INR 1.8.  - On  Warfarin and heparin on hold post op. Dr Synetta Fail managing anticoagulants.  - Off ASA with PUD.    4. Essential HTN - Maps elevated.  - Continue hydralazine 75 mg tid. - Continue sildenafil 20 mg tid     5. AKI - Creatinine  improved with IVF and diuretic hold, 1.8>>1.5>>1.4>>1.3   - Renal function stable today.   6. OSA - sleep study with very severe OSA (AHI 69/hr) - non-compliant Bipap in the past, has refused to use.  - He continues to tell respiratory that he will wear the machine himself.   - He is only using intermittently.   7. H/o GI bleed  - 08/2018 EGD completed and showed gastritis and nonbleeding duodenal ulcers, colonoscopy with 5 polyps removed. Protonix was increased to BID.  - No recent GI bleeding - hgb 8.7 stable. Expected surgical blood loss) - Transfuse hgb < 7.  - He will stay off aspirin for now.   8. Tobacco Abuse  - says he quit recently  9. Severe protein calorie malnutrition  - Albumin 2.1 on 4/14 - nutrition seeing   10 Hyperkalemia  -Stable today  12. Hypomagnesium - resolved. Mg 2.4 today   Continue to ambulate.   Length of Stay: 22  Amy Clegg, NP  09/07/2019, 7:12 AM  Advanced Heart Failure Team Pager [phone] (M-F; 7a - 4p)  Please contact [facility] for night-coverage after hours (4p -7a ) and weekends on [email or web address] 7:12 AM   Duplicate note. Seer my rounding note from this am fr further details.   Arvilla Meres, MD  8:59 PM

## 2019-09-07 NOTE — Plan of Care (Signed)
  Problem: Education: Goal: Knowledge of General Education information will improve Description Including pain rating scale, medication(s)/side effects and non-pharmacologic comfort measures Outcome: Progressing   

## 2019-09-07 NOTE — Progress Notes (Signed)
2 Days Post-Op Procedure(s) (LRB): DEBRIDEMENT AND CLOSURE OF ABDOMINAL WOUND (N/A) MUSCLE FLAP CLOSURE (N/A) Application Of A-Cell Of Chest/Abdomen (N/A) Subjective: Surgical pain is improving Still using PCA Walked 300 feet in hallway today Chest x-ray clear hemodynamic stable No output from wound VAC on subcu layer and skin JP drains from pectoralis flap still draining minimal amount INR 1.8-we will stop heparin and continue with oral Coumadin dosing Patient ready for transfer to  2 C progressive care  Objective: Vital signs in last 24 hours: Temp:  [97.7 F (36.5 C)-98.8 F (37.1 C)] 98.8 F (37.1 C) (04/23 0750) Pulse Rate:  [80-108] 108 (04/23 0700) Cardiac Rhythm: Heart block (04/23 0400) Resp:  [14-30] 15 (04/23 0700) BP: (88-115)/(67-96) 112/92 (04/23 0700) SpO2:  [94 %-100 %] 99 % (04/23 0700) Weight:  [98.2 kg] 98.2 kg (04/23 0500)  Hemodynamic parameters for last 24 hours: CVP:  [12 mmHg-20 mmHg] 19 mmHg  Intake/Output from previous day: 04/22 0701 - 04/23 0700 In: 1000.1 [P.O.:220; I.V.:580.1; IV Piggyback:200.1] Out: 2365 [Urine:2000; Drains:365] Intake/Output this shift: No intake/output data recorded.  Exam Alert and in better mood Lungs clear Wound VAC sponge compressed Abdomen distended but soft Extremities warm Normal VAD hum  Lab Results: Recent Labs    09/06/19 0355 09/07/19 0207  WBC 13.0* 11.4*  HGB 8.8* 8.7*  HCT 29.0* 29.0*  PLT 215 190   BMET:  Recent Labs    09/06/19 0743 09/07/19 0207  NA 131* 131*  K 5.0 4.2  CL 94* 93*  CO2 27 27  GLUCOSE 133* 109*  BUN 36* 33*  CREATININE 1.36* 1.30*  CALCIUM 9.1 9.2    PT/INR:  Recent Labs    09/07/19 0207  LABPROT 20.5*  INR 1.8*   ABG    Component Value Date/Time   PHART 7.405 09/08/2017 0049   HCO3 29.3 (H) 09/05/2019 1603   TCO2 31 09/05/2019 1603   ACIDBASEDEF 1.0 09/08/2017 0049   O2SAT 53.5 09/07/2019 0409   CBG (last 3)  Recent Labs    09/06/19 2316  09/07/19 0329 09/07/19 0751  GLUCAP 122* 84 154*    Assessment/Plan: S/P Procedure(s) (LRB): DEBRIDEMENT AND CLOSURE OF ABDOMINAL WOUND (N/A) MUSCLE FLAP CLOSURE (N/A) Application Of A-Cell Of Chest/Abdomen (N/A) Leave wound VAC in place until Wednesday Transfer to progressive care, continue with Coumadin Continue IV Maxipime until Monday then resume Ancef   LOS: 22 days    Jonathan Wood 09/07/2019

## 2019-09-08 LAB — CBC
HCT: 23 % — ABNORMAL LOW (ref 39.0–52.0)
HCT: 30 % — ABNORMAL LOW (ref 39.0–52.0)
Hemoglobin: 6.8 g/dL — CL (ref 13.0–17.0)
Hemoglobin: 8.9 g/dL — ABNORMAL LOW (ref 13.0–17.0)
MCH: 29.2 pg (ref 26.0–34.0)
MCH: 28.8 pg (ref 26.0–34.0)
MCHC: 29.6 g/dL — ABNORMAL LOW (ref 30.0–36.0)
MCHC: 29.7 g/dL — ABNORMAL LOW (ref 30.0–36.0)
MCV: 98.7 fL (ref 80.0–100.0)
MCV: 97.1 fL (ref 80.0–100.0)
Platelets: 145 10*3/uL — ABNORMAL LOW (ref 150–400)
Platelets: 183 10*3/uL (ref 150–400)
RBC: 2.33 MIL/uL — ABNORMAL LOW (ref 4.22–5.81)
RBC: 3.09 MIL/uL — ABNORMAL LOW (ref 4.22–5.81)
RDW: 17.7 % — ABNORMAL HIGH (ref 11.5–15.5)
RDW: 17.5 % — ABNORMAL HIGH (ref 11.5–15.5)
WBC: 10 10*3/uL (ref 4.0–10.5)
WBC: 12.6 10*3/uL — ABNORMAL HIGH (ref 4.0–10.5)
nRBC: 0 % (ref 0.0–0.2)
nRBC: 0 % (ref 0.0–0.2)

## 2019-09-08 LAB — COOXEMETRY PANEL
Carboxyhemoglobin: 2.8 % — ABNORMAL HIGH (ref 0.5–1.5)
Methemoglobin: 1.2 % (ref 0.0–1.5)
O2 Saturation: 60.6 %
Total hemoglobin: 9.6 g/dL — ABNORMAL LOW (ref 12.0–16.0)

## 2019-09-08 LAB — GLUCOSE, CAPILLARY
Glucose-Capillary: 109 mg/dL — ABNORMAL HIGH (ref 70–99)
Glucose-Capillary: 113 mg/dL — ABNORMAL HIGH (ref 70–99)
Glucose-Capillary: 221 mg/dL — ABNORMAL HIGH (ref 70–99)
Glucose-Capillary: 92 mg/dL (ref 70–99)
Glucose-Capillary: 94 mg/dL (ref 70–99)
Glucose-Capillary: 95 mg/dL (ref 70–99)

## 2019-09-08 LAB — PROTIME-INR
INR: 2 — ABNORMAL HIGH (ref 0.8–1.2)
Prothrombin Time: 22.3 seconds — ABNORMAL HIGH (ref 11.4–15.2)

## 2019-09-08 LAB — LACTATE DEHYDROGENASE: LDH: 119 U/L (ref 98–192)

## 2019-09-08 MED ORDER — SENNOSIDES-DOCUSATE SODIUM 8.6-50 MG PO TABS
1.0000 | ORAL_TABLET | Freq: Two times a day (BID) | ORAL | Status: DC
  Administered 2019-09-09 – 2019-09-10 (×2): 1 via ORAL
  Filled 2019-09-08 (×7): qty 1

## 2019-09-08 MED ORDER — SPIRONOLACTONE 25 MG PO TABS
25.0000 mg | ORAL_TABLET | Freq: Every day | ORAL | Status: DC
  Administered 2019-09-09 – 2019-09-13 (×5): 25 mg via ORAL
  Filled 2019-09-08 (×5): qty 1

## 2019-09-08 MED ORDER — FENTANYL 50 MCG/ML IV PCA SOLN
INTRAVENOUS | Status: DC
  Administered 2019-09-08: 80 ug/h via INTRAVENOUS

## 2019-09-08 MED ORDER — WARFARIN SODIUM 2.5 MG PO TABS
2.5000 mg | ORAL_TABLET | Freq: Once | ORAL | Status: AC
  Administered 2019-09-08: 2.5 mg via ORAL
  Filled 2019-09-08: qty 1

## 2019-09-08 MED ORDER — SODIUM CHLORIDE 0.9 % IV SOLN
2.0000 g | Freq: Three times a day (TID) | INTRAVENOUS | Status: DC
  Administered 2019-09-08 – 2019-09-13 (×16): 2 g via INTRAVENOUS
  Filled 2019-09-08 (×18): qty 2

## 2019-09-08 MED ORDER — POTASSIUM CHLORIDE CRYS ER 20 MEQ PO TBCR
40.0000 meq | EXTENDED_RELEASE_TABLET | Freq: Four times a day (QID) | ORAL | Status: DC
  Filled 2019-09-08: qty 2

## 2019-09-08 MED ORDER — OXYCODONE HCL 5 MG PO TABS
10.0000 mg | ORAL_TABLET | Freq: Four times a day (QID) | ORAL | Status: DC
  Administered 2019-09-08 – 2019-09-09 (×3): 10 mg via ORAL
  Filled 2019-09-08 (×3): qty 2

## 2019-09-08 MED ORDER — POTASSIUM CHLORIDE CRYS ER 20 MEQ PO TBCR
40.0000 meq | EXTENDED_RELEASE_TABLET | ORAL | Status: AC
  Administered 2019-09-08 – 2019-09-09 (×3): 40 meq via ORAL
  Filled 2019-09-08 (×2): qty 2

## 2019-09-08 MED ORDER — TORSEMIDE 20 MG PO TABS
20.0000 mg | ORAL_TABLET | Freq: Every day | ORAL | Status: DC
  Administered 2019-09-08 – 2019-09-09 (×2): 20 mg via ORAL
  Filled 2019-09-08 (×2): qty 1

## 2019-09-08 MED ORDER — DULOXETINE HCL 60 MG PO CPEP
60.0000 mg | ORAL_CAPSULE | Freq: Every day | ORAL | Status: DC
  Administered 2019-09-09 – 2019-09-13 (×5): 60 mg via ORAL
  Filled 2019-09-08 (×5): qty 1

## 2019-09-08 NOTE — Progress Notes (Signed)
Pt drowsy throughout shift. OOB in chair for 15 mts. Minimal fluid intake due to prolonged sleeping. Will continue to encourage intake.

## 2019-09-08 NOTE — Progress Notes (Signed)
ANTICOAGULATION CONSULT NOTE - Follow Up Consult  Pharmacy Consult for warfarin Indication: LVAD  No Known Allergies  Patient Measurements: Height: 6\' 2"  (188 cm) Weight: 98.8 kg (217 lb 13 oz) IBW/kg (Calculated) : 82.2  Vital Signs: Temp: 98.3 F (36.8 C) (04/24 0744) Temp Source: Oral (04/24 0744) BP: 90/74 (04/24 0744) Pulse Rate: 103 (04/24 0420)  Labs: Recent Labs    09/05/19 1603 09/06/19 0354 09/06/19 0355 09/06/19 0355 09/06/19 0743 09/06/19 1658 09/07/19 0207 09/07/19 0207 09/08/19 0442 09/08/19 0540  HGB   < >  --  8.8*   < >  --   --  8.7*   < > 6.8* 8.9*  HCT   < >  --  29.0*   < >  --   --  29.0*  --  23.0* 30.0*  PLT  --   --  215   < >  --   --  190  --  145* 183  LABPROT  --   --  17.3*  --   --   --  20.5*  --  22.3*  --   INR  --   --  1.4*  --   --   --  1.8*  --  2.0*  --   HEPARINUNFRC  --  <0.10*  --   --   --  0.18* 0.18*  --   --   --   CREATININE  --   --  1.40*   < > 1.36*  --  1.30*  --  0.79  --    < > = values in this interval not displayed.    Estimated Creatinine Clearance: 144.9 mL/min (by C-G formula based on SCr of 0.79 mg/dL).   Medical History: Past Medical History:  Diagnosis Date  . Asthma   . CHF (congestive heart failure) (HCC)    a. 09/2016: EF 20-25% with cath showing normal cors  . GERD (gastroesophageal reflux disease)   . History of hiatal hernia   . LVAD (left ventricular assist device) present (HCC)   . OSA on CPAP 09/06/2018   Severe OSA with AHI 68/hr on CPAP at 12cm H2O    Assessment: 48yom with HF s/p LVAD implant HM3 place 4/19 admitted with drive line infection. S/p OR on 4/15 for repeat I&D with application of wound vac.   Warfarin PTA 7.5mg  MW and 5mg  all other days admit INR elevated at 5 - probably acute infection related supra-sensitive.   Warfarin has been on hold since 4/19. Heparin was started on 4/18 with INR <2 and with plans to return to OR on 4/21. Both heparin and warfarin resumed post-OR  on 4/22 after discussing with Dr. 5/21. Heparin was stopped on 4/23.  INR today is 2. CBC stable. No bleeding noted.   Goal of Therapy:  INR 2-2.5 Monitor platelets by anticoagulation protocol: Yes   Plan:  -Warfarin 2.5 mg x 1 today -Daily INR, CBC  Donata Clay, PharmD, [facility] PGY2 Cardiology Pharmacy Resident Phone [phone] 09/08/2019       3:13 PM  Please check [email or web address] for unit-specific pharmacist phone numbers

## 2019-09-08 NOTE — Progress Notes (Signed)
Patient ID: Jonathan Wood, male   DOB: March 25, 1971, 49 y.o.   MRN: [id number]     Advanced Heart Failure Rounding Note  PCP-Cardiologist: Arvilla Meres, MD    Patient Profile   49 y.o. 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  Subjective:    - 4/5 S/p I&D w/ excisional debridement of subxiphoid abscess. Cultures + Staph Aureus  - 4/7, Returned to OR for debridement, Irrigation and Packing of Abdominal Incision - 4/9 S/P I&D abd wound with Acell and VAC application.  - 4/15 s/p I&D wound with VAC application.  - 4/19 S/P I&D with VAC application.  - 4/21 S/P reconstructive surgery with pec flap. Received steroids in the OR.   Remains on PCA. Pushing button frequently. Says pain better but persistent. RN reports drowsy during the day.   Remains AF. Now on cefepime and rifabutin.   INR 2.0. Off heparin. No bleeding.   HM3 VAD Interrogation: Speed:5600 Flow:5.2 Power:4.2   PI: 2.9  VAD interrogated personally. Parameters stable.  Objective:   Weight Range: 98.8 kg Body mass index is 27.97 kg/m.   Vital Signs:   Temp:  [97.1 F (36.2 C)-98.3 F (36.8 C)] 98.3 F (36.8 C) (04/24 0744) Pulse Rate:  [95-103] 103 (04/24 0420) Resp:  [17-20] 18 (04/24 0800) BP: (90-142)/(73-118) 90/74 (04/24 0744) SpO2:  [95 %-99 %] 98 % (04/24 0800) Weight:  [98.8 kg] 98.8 kg (04/24 0500) Last BM Date: 09/06/19  Weight change: Filed Weights   09/06/19 0500 09/07/19 0500 09/08/19 0500  Weight: 99.3 kg 98.2 kg 98.8 kg    Intake/Output:   Intake/Output Summary (Last 24 hours) at 09/08/2019 1613 Last data filed at 09/08/2019 1200 Gross per 24 hour  Intake 925 ml  Output 1150 ml  Net -225 ml    Telemetry: SR 90-100   Physical Exam    General:  Sitting up in bed. NAD.  HEENT: normal  Neck: supple. JVP to jaw   Carotids 2+ bilat; no bruits. No lymphadenopathy or thryomegaly appreciated. Cor: LVAD hum.  Lungs: Clear. Abdomen: wound vac site ok. obese soft, nontender, non-distended. No hepatosplenomegaly. No bruits or masses. Good bowel sounds. Driveline site clean. Anchor in place.  Extremities: no cyanosis, clubbing, rash. Warm no edema  Neuro: alert & oriented x 3. No focal deficits. Moves all 4 without problem    Labs    CBC Recent Labs    09/08/19 0442 09/08/19 0540  WBC 10.0 12.6*  HGB 6.8* 8.9*  HCT 23.0* 30.0*  MCV 98.7 97.1  PLT 145* 183   Basic Metabolic Panel Recent Labs    04/88/89 0355 09/06/19 0743 09/07/19 0207 09/08/19 0442  NA 130*   < > 131* 136  K 5.7*   < > 4.2 3.0*  CL 95*   < > 93* 107  CO2 26   < > 27 21*  GLUCOSE 128*   < > 109* 81  BUN 36*   < > 33* 20  CREATININE 1.40*   < > 1.30* 0.79  CALCIUM 9.2   < > 9.2 7.0*  MG 2.4  --   --   --    < > = values in this interval not displayed.   Liver Function Tests Recent Labs    09/07/19 0207  AST 21  ALT 7  ALKPHOS 105  BILITOT 1.2  PROT 7.4  ALBUMIN 2.9*   No results for input(s): LIPASE, AMYLASE in the last 72 hours. Cardiac Enzymes No results for input(s): CKTOTAL, CKMB, CKMBINDEX, TROPONINI in the last 72 hours.  BNP: BNP (last 3 results) Recent Labs    03/21/19 1415 04/23/19 1350  BNP 426.9* 709.6*    ProBNP (last 3 results) No results for input(s): PROBNP in the last 8760 hours.   D-Dimer No results for input(s): DDIMER in the last 72 hours. Hemoglobin A1C No results for input(s): HGBA1C in the last 72 hours. Fasting Lipid Panel No results for input(s): CHOL, HDL, LDLCALC, TRIG, CHOLHDL, LDLDIRECT in the last 72 hours. Thyroid Function Tests No results for input(s): TSH, T4TOTAL, T3FREE, THYROIDAB in the last 72 hours.  Invalid input(s): FREET3  Other results:   Imaging    No results found.   Medications:     Scheduled Medications: . acetaminophen  1,000 mg Oral  Q6H   Or  . acetaminophen (TYLENOL) oral liquid 160 mg/5 mL  1,000 mg Oral Q6H  . amiodarone  200 mg Oral BID  . vitamin C  250 mg Oral Daily  . bisacodyl  10 mg Oral Daily  . Chlorhexidine Gluconate Cloth  6 each Topical Daily  . docusate sodium  100 mg Oral TID  . DULoxetine  30 mg Oral Daily  . feeding supplement (ENSURE ENLIVE)  237 mL Oral TID BM  . fentaNYL   Intravenous Q4H  . ferrous sulfate  325 mg Oral Q breakfast  . gabapentin  300 mg Oral TID  . hydrALAZINE  75 mg Oral TID  . insulin aspart  0-24 Units Subcutaneous Q4H  . linaclotide  145 mcg Oral Daily  . mouth rinse  15 mL Mouth Rinse BID  . metoCLOPramide (REGLAN) injection  10 mg Intravenous Q6H  . multivitamin with minerals  1 tablet Oral Daily  . pantoprazole  40 mg Oral BID  . potassium chloride  40 mEq Oral Q6H  . rifabutin  300 mg Oral Daily  . senna-docusate  1 tablet Oral QHS  . sildenafil  20 mg Oral TID  . sodium chloride flush  10-40 mL Intracatheter Q12H  . spironolactone  12.5 mg Oral Daily  . warfarin  2.5 mg Oral ONCE-1600  . Warfarin - Pharmacist Dosing Inpatient   Does not apply q1600  . zinc sulfate  220 mg Oral Daily    Infusions: . sodium chloride Stopped (09/06/19 1456)  . sodium chloride 10 mL/hr at 09/07/19 0721  . ceFEPime (MAXIPIME) IV 2 g (09/08/19 1000)  . dextrose 5 % and 0.45% NaCl Stopped (08/25/19 1414)  . lactated ringers Stopped (09/05/19 1651)    PRN Medications: sodium chloride, sodium chloride, acetaminophen, albuterol, alum & mag hydroxide-simeth, bisacodyl **OR** bisacodyl, diphenhydrAMINE **OR** diphenhydrAMINE, fentaNYL (SUBLIMAZE) injection, guaiFENesin-dextromethorphan, HYDROmorphone (DILAUDID) injection, hydrOXYzine, magnesium hydroxide, naloxone **AND** sodium chloride flush, ondansetron (ZOFRAN) IV, ondansetron (ZOFRAN) IV, oxyCODONE, sodium chloride flush, traMADol    Assessment/Plan    1. Fever & leukocytosis -> MSSA bacteremia/ Subxiphoid Abscess  - bcx 2/2  MSSA - ID has changed antibiotics to cefazolin, now has PICC.   - WBC trending down.  Now 13.  Afebrile. - He is not a candidate for VAD exchange due to severe RV failure and noncompliance.  - S/p I&D on 4/5. Wound cultures +MSSA.  - Back to OR 4/7, 4/9, 4/15 , 4/19 debridement and irrigation of driveline + packing, wound vac in place.    -S/P reconstructive surgery with  pec flap 4/21 - ID recommends 6 weeks of IV abx followed by long-term suppressive oral cephalexin. Now on cefepime and rifabutin. Doses adjusted by PharmD today - Remains AF - need to begin weaning PCA today. With schedule long-acting oxy IR and wean PCA ov next day or two.  Discussed dosing with PharmD personally.   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  S/p HM-3 LVAD on 09/06/17 - On sildenafil for RV failure.  - Weigh stable around 215.  - Will place on torsemide 20 daily  - Continue current dose of hydralazine and sildenafil. No imdur with sildenafil. MAPs in the 90-100s   3. HM3 LVAD 09/06/2017. - VAD interrogated personally. Parameters stable. - LDH 119  today  - INR 2.0 - Continue warfarin. Discussed dosing with PharmD personally. - Off ASA with PUD.    4. Essential HTN - MAPs stable - Continue hydralazine 75 mg tid. - Continue sildenafil 20 mg tid     5. AKI - Creatinine improved with IVF and diuretic hold - Creatinine 0.79 today  6. OSA - sleep study with very severe OSA (AHI 69/hr) - non-compliant Bipap in the past, has refused to use.  - He continues to tell respiratory that he will wear the machine himself.   - He is only using intermittently.   7. H/o GI bleed  - 08/2018 EGD completed and showed gastritis and nonbleeding duodenal ulcers, colonoscopy with 5 polyps removed. Protonix was increased to BID.  - No recent GI bleeding - hgb 8.9 stable. - Transfuse hgb < 7.  - He will stay off aspirin for now.   8. Tobacco Abuse  - says he quit  recently  9. Severe protein calorie malnutrition  - Albumin 2.1 on 4/14 - nutrition seeing   10 Hyperkalemia/hypokalemia - will supp   12. Hypomagnesium - resolved. Mg 2.4  Mobilize. Wean pain meds.   Length of Stay: 23  Arvilla Meres, MD  09/08/2019, 4:13 PM  Advanced Heart Failure Team Pager [phone] (M-F; 7a - 4p)  Please contact [facility] for night-coverage after hours (4p -7a ) and weekends on [email or web address] 4:13 PM

## 2019-09-08 NOTE — Progress Notes (Signed)
CRITICAL VALUE ALERT  Critical Value: 6.8 Date & Time Notied:  09/08/19 @0520  Provider Notified: , RN  Orders Received/Actions taken: MD paged. Awaiting new orders

## 2019-09-09 LAB — BASIC METABOLIC PANEL
Anion gap: 8 (ref 5–15)
Anion gap: 10 (ref 5–15)
BUN: 20 mg/dL (ref 6–20)
BUN: 23 mg/dL — ABNORMAL HIGH (ref 6–20)
CO2: 21 mmol/L — ABNORMAL LOW (ref 22–32)
CO2: 25 mmol/L (ref 22–32)
Calcium: 7 mg/dL — ABNORMAL LOW (ref 8.9–10.3)
Calcium: 9 mg/dL (ref 8.9–10.3)
Chloride: 107 mmol/L (ref 98–111)
Chloride: 97 mmol/L — ABNORMAL LOW (ref 98–111)
Creatinine, Ser: 0.79 mg/dL (ref 0.61–1.24)
Creatinine, Ser: 1.32 mg/dL — ABNORMAL HIGH (ref 0.61–1.24)
GFR calc Af Amer: >60 mL/min (ref >60)
GFR calc Af Amer: >60 mL/min (ref >60)
GFR calc non Af Amer: >60 mL/min (ref >60)
GFR calc non Af Amer: >60 mL/min (ref >60)
Glucose, Bld: 81 mg/dL (ref 70–99)
Glucose, Bld: 108 mg/dL — ABNORMAL HIGH (ref 70–99)
Potassium: 3 mmol/L — ABNORMAL LOW (ref 3.5–5.1)
Potassium: 4.2 mmol/L (ref 3.5–5.1)
Sodium: 136 mmol/L (ref 135–145)
Sodium: 132 mmol/L — ABNORMAL LOW (ref 135–145)

## 2019-09-09 LAB — GLUCOSE, CAPILLARY
Glucose-Capillary: 100 mg/dL — ABNORMAL HIGH (ref 70–99)
Glucose-Capillary: 107 mg/dL — ABNORMAL HIGH (ref 70–99)
Glucose-Capillary: 111 mg/dL — ABNORMAL HIGH (ref 70–99)
Glucose-Capillary: 115 mg/dL — ABNORMAL HIGH (ref 70–99)
Glucose-Capillary: 174 mg/dL — ABNORMAL HIGH (ref 70–99)
Glucose-Capillary: 77 mg/dL (ref 70–99)

## 2019-09-09 LAB — PROTIME-INR
INR: 2 — ABNORMAL HIGH (ref 0.8–1.2)
Prothrombin Time: 22.5 seconds — ABNORMAL HIGH (ref 11.4–15.2)

## 2019-09-09 LAB — CULTURE, BLOOD (ROUTINE X 2)
Culture: NO GROWTH
Culture: NO GROWTH
Special Requests: ADEQUATE

## 2019-09-09 LAB — CBC
HCT: 29.9 % — ABNORMAL LOW (ref 39.0–52.0)
Hemoglobin: 8.8 g/dL — ABNORMAL LOW (ref 13.0–17.0)
MCH: 28.8 pg (ref 26.0–34.0)
MCHC: 29.4 g/dL — ABNORMAL LOW (ref 30.0–36.0)
MCV: 97.7 fL (ref 80.0–100.0)
Platelets: 200 10*3/uL (ref 150–400)
RBC: 3.06 MIL/uL — ABNORMAL LOW (ref 4.22–5.81)
RDW: 17.3 % — ABNORMAL HIGH (ref 11.5–15.5)
WBC: 9.7 10*3/uL (ref 4.0–10.5)
nRBC: 0 % (ref 0.0–0.2)

## 2019-09-09 LAB — COOXEMETRY PANEL
Carboxyhemoglobin: 2.1 % — ABNORMAL HIGH (ref 0.5–1.5)
Methemoglobin: 0.5 % (ref 0.0–1.5)
O2 Saturation: 67 %
Total hemoglobin: 16 g/dL (ref 12.0–16.0)

## 2019-09-09 LAB — LACTATE DEHYDROGENASE: LDH: 152 U/L (ref 98–192)

## 2019-09-09 MED ORDER — WARFARIN SODIUM 4 MG PO TABS
4.0000 mg | ORAL_TABLET | Freq: Once | ORAL | Status: DC
  Filled 2019-09-09: qty 1

## 2019-09-09 MED ORDER — OXYCODONE HCL 5 MG PO TABS
10.0000 mg | ORAL_TABLET | Freq: Three times a day (TID) | ORAL | Status: DC
  Administered 2019-09-09 – 2019-09-13 (×13): 10 mg via ORAL
  Filled 2019-09-09 (×13): qty 2

## 2019-09-09 NOTE — Progress Notes (Signed)
Patient ID: Jonathan Wood, male   DOB: [birth year], 49 y.o.   MRN: [id number]     Advanced Heart Failure Rounding Note  PCP-Cardiologist: Arvilla Meres, MD    Patient Profile   49 y.o. 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  Subjective:    - 4/5 S/p I&D w/ excisional debridement of subxiphoid abscess. Cultures + Staph Aureus  - 4/7, Returned to OR for debridement, Irrigation and Packing of Abdominal Incision - 4/9 S/P I&D abd wound with Acell and VAC application.  - 4/15 s/p I&D wound with VAC application.  - 4/19 S/P I&D with VAC application.  - 4/21 S/P reconstructive surgery with pec flap. Received steroids in the OR.   PCA off. Getting oral pain meds. Still sore but says it is tolerable. Had BM yesterdya  Remains afebrile. Tm 98.3. Now on cefepime and rifabutin.   INR 2.0. Off heparin. No bleeding.  CVP 14. Co-ox 67%   HM3 VAD Interrogation: Speed:5600 Flow:5.1 Power:4.3   PI: 3.0  VAD interrogated personally. Parameters stable.   Objective:   Weight Range: 97.5 kg Body mass index is 27.6 kg/m.   Vital Signs:   Temp:  [97.8 F (36.6 C)-98.3 F (36.8 C)] 98.2 F (36.8 C) (04/25 0800) Pulse Rate:  [64-98] 70 (04/25 0800) Resp:  [12-28] 19 (04/25 0800) BP: (87-104)/(54-81) 104/81 (04/25 0800) SpO2:  [97 %-99 %] 99 % (04/25 0800) Weight:  [97.5 kg] 97.5 kg (04/25 0420) Last BM Date: 09/06/19  Weight change: Filed Weights   09/07/19 0500 09/08/19 0500 09/09/19 0420  Weight: 98.2 kg 98.8 kg 97.5 kg    Intake/Output:   Intake/Output Summary (Last 24 hours) at 09/09/2019 1030 Last data filed at 09/09/2019 0832 Gross per 24 hour  Intake 400 ml  Output 870 ml  Net -470 ml    Telemetry: SR 90-100   Physical Exam    General:  NAD.  HEENT: normal  Neck: supple. JVP to jaw.  Carotids 2+  bilat; no bruits. No lymphadenopathy or thryomegaly appreciated. Cor: LVAD hum.  Lungs: Clear. Abdomen: + wound vac. obese soft, minimally tender, non-distended. No hepatosplenomegaly. No bruits or masses. Good bowel sounds. Driveline site clean. Anchor in place.  Extremities: no cyanosis, clubbing, rash. Warm no edema  Neuro: alert & oriented x 3. No focal deficits. Moves all 4 without problem   Labs    CBC Recent Labs    09/08/19 0540 09/09/19 0454  WBC 12.6* 9.7  HGB 8.9* 8.8*  HCT 30.0* 29.9*  MCV 97.1 97.7  PLT 183 200   Basic Metabolic Panel Recent Labs    47/42/59 0442 09/09/19 0454  NA 136 132*  K 3.0* 4.2  CL 107 97*  CO2 21* 25  GLUCOSE 81 108*  BUN 20 23*  CREATININE 0.79 1.32*  CALCIUM 7.0* 9.0   Liver Function Tests Recent Labs    09/07/19 0207  AST 21  ALT 7  ALKPHOS 105  BILITOT 1.2  PROT 7.4  ALBUMIN 2.9*   No results for input(s): LIPASE, AMYLASE in the last 72 hours. Cardiac Enzymes No results for input(s): CKTOTAL, CKMB, CKMBINDEX, TROPONINI in the last 72 hours.  BNP: BNP (last 3 results) Recent Labs    03/21/19 1415 04/23/19 1350  BNP 426.9* 709.6*    ProBNP (last 3 results) No results for input(s): PROBNP in  the last 8760 hours.   D-Dimer No results for input(s): DDIMER in the last 72 hours. Hemoglobin A1C No results for input(s): HGBA1C in the last 72 hours. Fasting Lipid Panel No results for input(s): CHOL, HDL, LDLCALC, TRIG, CHOLHDL, LDLDIRECT in the last 72 hours. Thyroid Function Tests No results for input(s): TSH, T4TOTAL, T3FREE, THYROIDAB in the last 72 hours.  Invalid input(s): FREET3  Other results:   Imaging    No results found.   Medications:     Scheduled Medications: . acetaminophen  1,000 mg Oral Q6H   Or  . acetaminophen (TYLENOL) oral liquid 160 mg/5 mL  1,000 mg Oral Q6H  . amiodarone  200 mg Oral BID  . vitamin C  250 mg Oral Daily  . bisacodyl  10 mg Oral Daily  . Chlorhexidine  Gluconate Cloth  6 each Topical Daily  . docusate sodium  100 mg Oral TID  . DULoxetine  60 mg Oral Daily  . feeding supplement (ENSURE ENLIVE)  237 mL Oral TID BM  . ferrous sulfate  325 mg Oral Q breakfast  . gabapentin  300 mg Oral TID  . hydrALAZINE  75 mg Oral TID  . insulin aspart  0-24 Units Subcutaneous Q4H  . linaclotide  145 mcg Oral Daily  . mouth rinse  15 mL Mouth Rinse BID  . metoCLOPramide (REGLAN) injection  10 mg Intravenous Q6H  . multivitamin with minerals  1 tablet Oral Daily  . oxyCODONE  10 mg Oral Q6H  . pantoprazole  40 mg Oral BID  . rifabutin  300 mg Oral Daily  . senna-docusate  1 tablet Oral BID  . sildenafil  20 mg Oral TID  . sodium chloride flush  10-40 mL Intracatheter Q12H  . spironolactone  25 mg Oral Daily  . torsemide  20 mg Oral Daily  . Warfarin - Pharmacist Dosing Inpatient   Does not apply q1600  . zinc sulfate  220 mg Oral Daily    Infusions: . sodium chloride Stopped (09/06/19 1456)  . sodium chloride 10 mL/hr at 09/07/19 0721  . ceFEPime (MAXIPIME) IV 2 g (09/09/19 0547)  . dextrose 5 % and 0.45% NaCl Stopped (08/25/19 1414)  . lactated ringers Stopped (09/05/19 1651)    PRN Medications: sodium chloride, sodium chloride, acetaminophen, albuterol, alum & mag hydroxide-simeth, bisacodyl **OR** bisacodyl, fentaNYL (SUBLIMAZE) injection, guaiFENesin-dextromethorphan, HYDROmorphone (DILAUDID) injection, hydrOXYzine, magnesium hydroxide, ondansetron (ZOFRAN) IV, sodium chloride flush, traMADol    Assessment/Plan    1. Fever & leukocytosis -> MSSA bacteremia/ Subxiphoid Abscess  - bcx 2/2 MSSA - ID has changed antibiotics to cefazolin, now has PICC.   - WBC trending down.  Now 13.  Afebrile. - He is not a candidate for VAD exchange due to severe RV failure and noncompliance.  - S/p I&D on 4/5. Wound cultures +MSSA.  - Back to OR 4/7, 4/9, 4/15 , 4/19 debridement and irrigation of driveline + packing, wound vac in place.    -S/P  reconstructive surgery with pec flap 4/21 - ID recommends 6 weeks of IV abx followed by long-term suppressive oral cephalexin. Now on cefepime and rifabutin. D/w PharmD. Likely switch back to cefazolin prior to d/c.  - RemainsAF - Now off PCA. Wean narcotics to oxycodone q6 -> q8 - Need to ambulate today. 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  S/p HM-3 LVAD on 09/06/17 - On sildenafil for RV failure.  -  Weigh stable around 215. Co-ox 67% - Continue torsemide 20 daily  - Continue current dose of hydralazine and sildenafil. No imdur with sildenafil. MAPs stable in the 90-100s   3. HM3 LVAD 09/06/2017. - VAD interrogated personally. Parameters stable. - LDH 152  today  - INR 2.0 - Continue warfarin. Discussed dosing with PharmD personally. - Off ASA with PUD.    4. Essential HTN - MAPs stable - Continue hydralazine 75 mg tid. - Continue sildenafil 20 mg tid     5. AKI - Creatinine improved with IVF ona dmit - Creatinine 1.32 today. Follow  6. OSA - sleep study with very severe OSA (AHI 69/hr) - non-compliant Bipap in the past, has refused to use.  - He continues to tell respiratory that he will wear the machine himself.   - He is only using intermittently.   7. H/o GI bleed  - 08/2018 EGD completed and showed gastritis and nonbleeding duodenal ulcers, colonoscopy with 5 polyps removed. Protonix was increased to BID.  - No recent GI bleeding - hgb 8.8 table. - Transfuse hgb < 7.  - He will stay off aspirin for now.   8. Tobacco Abuse  - says he quit recently  9. Severe protein calorie malnutrition  - Albumin 2.1 on 4/14 - nutrition seeing   10 Hyperkalemia/hypokalemia - will supp   12. Hypomagnesium - resolved. Mg 2.4  Mobilize. Wean pain meds.   Length of Stay: 24  Arvilla Meres, MD  09/09/2019, 10:30 AM  Advanced Heart Failure Team Pager [phone] (M-F; 7a - 4p)  Please contact [facility] for night-coverage after hours (4p -7a ) and weekends on [email or web address] 10:30 AM

## 2019-09-09 NOTE — Progress Notes (Signed)
ANTICOAGULATION CONSULT NOTE - Follow Up Consult  Pharmacy Consult for warfarin Indication: LVAD  No Known Allergies  Patient Measurements: Height: 6\' 2"  (188 cm) Weight: 97.5 kg (214 lb 15.2 oz) IBW/kg (Calculated) : 82.2  Vital Signs: Temp: 98.2 F (36.8 C) (04/25 0800) Temp Source: Oral (04/25 0800) BP: 104/81 (04/25 0800) Pulse Rate: 70 (04/25 0800)  Labs: Recent Labs    09/06/19 1658 09/07/19 0207 09/07/19 0207 09/08/19 0442 09/08/19 0442 09/08/19 0540 09/09/19 0454  HGB  --  8.7*   < > 6.8*   < > 8.9* 8.8*  HCT  --  29.0*   < > 23.0*  --  30.0* 29.9*  PLT  --  190   < > 145*  --  183 200  LABPROT  --  20.5*  --  22.3*  --   --  22.5*  INR  --  1.8*  --  2.0*  --   --  2.0*  HEPARINUNFRC 0.18* 0.18*  --   --   --   --   --   CREATININE  --  1.30*  --  0.79  --   --  1.32*   < > = values in this interval not displayed.    Estimated Creatinine Clearance: 81.3 mL/min (A) (by C-G formula based on SCr of 1.32 mg/dL (H)).   Medical History: Past Medical History:  Diagnosis Date  . Asthma   . CHF (congestive heart failure) (HCC)    a. 09/2016: EF 20-25% with cath showing normal cors  . GERD (gastroesophageal reflux disease)   . History of hiatal hernia   . LVAD (left ventricular assist device) present (HCC)   . OSA on CPAP 09/06/2018   Severe OSA with AHI 68/hr on CPAP at 12cm H2O    Assessment: 48yom with HF s/p LVAD implant HM3 place 4/19 admitted with drive line infection. S/p OR on 4/15 for repeat I&D with application of wound vac.   Warfarin PTA 7.5mg  MW and 5mg  all other days admit INR elevated at 5 - probably acute infection related supra-sensitive.   Warfarin has been on hold since 4/19. Heparin was started on 4/18 with INR <2 and with plans to return to OR on 4/21. Both heparin and warfarin resumed post-OR on 4/22 after discussing with Dr. 5/21. Heparin was stopped on 4/23.  INR today is 2. CBC stable. No bleeding noted.   Goal of Therapy:   INR 2-2.5 Monitor platelets by anticoagulation protocol: Yes   Plan:  -Warfarin 4 mg x 1 today -Daily INR, CBC  Donata Clay, PharmD, [facility] PGY2 Cardiology Pharmacy Resident Phone [phone] 09/09/2019       10:47 AM  Please check [email or web address] for unit-specific pharmacist phone numbers

## 2019-09-10 LAB — GLUCOSE, CAPILLARY
Glucose-Capillary: 100 mg/dL — ABNORMAL HIGH (ref 70–99)
Glucose-Capillary: 121 mg/dL — ABNORMAL HIGH (ref 70–99)
Glucose-Capillary: 134 mg/dL — ABNORMAL HIGH (ref 70–99)
Glucose-Capillary: 82 mg/dL (ref 70–99)
Glucose-Capillary: 93 mg/dL (ref 70–99)
Glucose-Capillary: 97 mg/dL (ref 70–99)

## 2019-09-10 LAB — CBC
HCT: 29.1 % — ABNORMAL LOW (ref 39.0–52.0)
Hemoglobin: 8.5 g/dL — ABNORMAL LOW (ref 13.0–17.0)
MCH: 28.6 pg (ref 26.0–34.0)
MCHC: 29.2 g/dL — ABNORMAL LOW (ref 30.0–36.0)
MCV: 98 fL (ref 80.0–100.0)
Platelets: 219 10*3/uL (ref 150–400)
RBC: 2.97 MIL/uL — ABNORMAL LOW (ref 4.22–5.81)
RDW: 17.2 % — ABNORMAL HIGH (ref 11.5–15.5)
WBC: 9.5 10*3/uL (ref 4.0–10.5)
nRBC: 0 % (ref 0.0–0.2)

## 2019-09-10 LAB — COOXEMETRY PANEL
Carboxyhemoglobin: 2.7 % — ABNORMAL HIGH (ref 0.5–1.5)
Methemoglobin: 1.1 % (ref 0.0–1.5)
O2 Saturation: 67.5 %
Total hemoglobin: 11.4 g/dL — ABNORMAL LOW (ref 12.0–16.0)

## 2019-09-10 LAB — BASIC METABOLIC PANEL
Anion gap: 10 (ref 5–15)
BUN: 25 mg/dL — ABNORMAL HIGH (ref 6–20)
CO2: 25 mmol/L (ref 22–32)
Calcium: 9.2 mg/dL (ref 8.9–10.3)
Chloride: 99 mmol/L (ref 98–111)
Creatinine, Ser: 1.59 mg/dL — ABNORMAL HIGH (ref 0.61–1.24)
GFR calc Af Amer: 59 mL/min — ABNORMAL LOW (ref >60)
GFR calc non Af Amer: 51 mL/min — ABNORMAL LOW (ref >60)
Glucose, Bld: 105 mg/dL — ABNORMAL HIGH (ref 70–99)
Potassium: 4.6 mmol/L (ref 3.5–5.1)
Sodium: 134 mmol/L — ABNORMAL LOW (ref 135–145)

## 2019-09-10 LAB — LACTATE DEHYDROGENASE: LDH: 156 U/L (ref 98–192)

## 2019-09-10 LAB — MAGNESIUM: Magnesium: 1.5 mg/dL — ABNORMAL LOW (ref 1.7–2.4)

## 2019-09-10 LAB — PROTIME-INR
INR: 2.2 — ABNORMAL HIGH (ref 0.8–1.2)
Prothrombin Time: 24.4 seconds — ABNORMAL HIGH (ref 11.4–15.2)

## 2019-09-10 MED ORDER — MAGNESIUM SULFATE 2 GM/50ML IV SOLN
2.0000 g | Freq: Once | INTRAVENOUS | Status: AC
  Administered 2019-09-10: 2 g via INTRAVENOUS
  Filled 2019-09-10: qty 50

## 2019-09-10 MED ORDER — WARFARIN SODIUM 4 MG PO TABS
4.0000 mg | ORAL_TABLET | Freq: Once | ORAL | Status: AC
  Administered 2019-09-10: 17:00:00 4 mg via ORAL
  Filled 2019-09-10: qty 1

## 2019-09-10 NOTE — Progress Notes (Signed)
LVAD Coordinator Rounding Note:  Admitted 08/16/19 by Dr. [name] due to VAD drive line infection/pneumonia/sepsis  HM III LVAD implanted on 09/06/17 by Dr. Cyndia Bent under Destination Therapy criteria.  Pt laying in bed resting. Pain under control this morning. Pt with flat affect; states he is "just ready to get out of the hospital."   Montgomery straps remain in place over sternal wound; wound vac in place with scant bloody drainage. Plan for bedside wound vac dressing change tomorrow.   Vital signs: Temp: 98.4 HR: 100 Auto BP: 101/60 (72) Doppler:  Sat: 98% on RA Wt: 227.5>231>235>233.9>236.3.....216>220>219>216>213>213>214.9>218.9>216.4>211.6 lbs   LVAD interrogation reveals:  Speed: 5600 Flow: 5.1 Power:  4.3w PI: 3.6 Alarms: none Events: none Hematocrit: 27  Fixed speed: 5600  Low speed limit: 5300  Drive Line:  Left abdominal drive line dressing C/D/I with anchor unattached. Anchor replaced. Weekly dressing changes per BS nurse. Dressing change due today.   Sternal wound: dressing with montgomery straps in place; wound vac attached to suction.  No alarms noted. Plan for dressing change tomorrow by Dr Marla Roe / Dr Prescott Gum.   Blood products: - 09/04/19>>1 PCs - 09/05/19>>2 FFP  Labs:  LDH trend: 227>230>271........208>187>161>175>168>172>149>169>145>156  INR trend: 4.5>5.0>1.7>1.7>1.7>1.8>1.7>2.1>3.4>2.5>2.7>2.1>1.4>1.5>1.4>1.5>1.4>1.8>2.2    WBC: 27.3>26.2>24>34.6>25.8>20.8......13.0>12.4>14.8>19.3>11.9>13.0>9.5  Alk Phos: 138> 177>123  AST: 53> 154>33  ALT: 17> 35>13  Infection: - blood cultures 08/16/19>>staphylococcus aureus - blood cultures 08/18/19>> NGTD - Drive line tunnel / sternum 08/20/19>> abundant WBC, gram + cocci, culture- few staph aureus - Drive line AFB 5/5/217>> pending - Drive line/Sternal Fungus culture 08/20/19>> negative - blood cultures 09/02/19>>negative/final - body fluid culture 08/28/19>> negative/final - urine culture  09/04/19>>negative/final - blood cultures 09/04/19>> NG x 2 days; final pending  Anticoagulation Plan: -INR Goal: 2.0 - 2.5  -ASA Dose: none  Adverse Events: - driveline cx 47/1/59>> staph aureus - Admitted for sternal/drive line infection. OR for debridement 08/20/19- + staph aureus wound cultures; further irrigation/debridements done 08/22/19, 08/24/19, 08/30/19, and 09/03/19. Debridement and closure of sternal wound with muscle flap 09/05/19.     Plan/Recommendations:  1. Call VAD pager if any VAD equipment or drive line issues. 2. Weekly drive line dressing changes by bedside nurse.   Emerson Monte RN [place] Coordinator  Office: [phone]  24/7 Pager: [phone]

## 2019-09-10 NOTE — Plan of Care (Signed)
  Problem: Education: Goal: Knowledge of General Education information will improve Description: Including pain rating scale, medication(s)/side effects and non-pharmacologic comfort measures Outcome: Progressing   Problem: Health Behavior/Discharge Planning: Goal: Ability to manage health-related needs will improve Outcome: Progressing   Problem: Clinical Measurements: Goal: Ability to maintain clinical measurements within normal limits will improve Outcome: Progressing Goal: Will remain free from infection Outcome: Progressing Goal: Diagnostic test results will improve Outcome: Progressing Goal: Cardiovascular complication will be avoided Outcome: Progressing   Problem: Nutrition: Goal: Adequate nutrition will be maintained Outcome: Progressing   Problem: Coping: Goal: Level of anxiety will decrease Outcome: Progressing   Problem: Elimination: Goal: Will not experience complications related to bowel motility Outcome: Progressing   Problem: Pain Managment: Goal: General experience of comfort will improve Outcome: Progressing   Problem: Safety: Goal: Ability to remain free from injury will improve Outcome: Progressing   Problem: Skin Integrity: Goal: Risk for impaired skin integrity will decrease Outcome: Progressing   Problem: Cardiac: Goal: LVAD will function as expected and patient will experience no clinical alarms Outcome: Progressing   Problem: Education: Goal: Patient will understand all VAD equipment and how it functions Outcome: Progressing Goal: Patient will be able to verbalize current INR target range and antiplatelet therapy for discharge home Outcome: Progressing   Problem: Cardiac: Goal: LVAD will function as expected and patient will experience no clinical alarms Outcome: Progressing   Problem: Education: Goal: Knowledge of the prescribed therapeutic regimen will improve Outcome: Progressing   Problem: Fluid Volume: Goal: Risk for excess  fluid volume will decrease Outcome: Progressing   Problem: Education: Goal: Required Educational Video(s) Outcome: Progressing   Problem: Clinical Measurements: Goal: Postoperative complications will be avoided or minimized Outcome: Progressing   Problem: Skin Integrity: Goal: Demonstration of wound healing without infection will improve Outcome: Progressing

## 2019-09-10 NOTE — Progress Notes (Addendum)
Patient ID: Jonathan Wood, male   DOB: [birth year], 49 y.o.   MRN: [id number]     Advanced Heart Failure Rounding Note  PCP-Cardiologist: [name], MD    Patient Profile   49 y.o. 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  Subjective:    - 4/5 S/p I&D w/ excisional debridement of subxiphoid abscess. Cultures + Staph Aureus  - 4/7, Returned to OR for debridement, Irrigation and Packing of Abdominal Incision - 4/9 S/P I&D abd wound with Acell and VAC application.  - 4/15 s/p I&D wound with VAC application.  - 4/19 S/P I&D with VAC application.  - 4/21 S/P reconstructive surgery with pec flap. Received steroids in the OR.    Remains afebrile. Now on cefepime and rifabutin.   INR 2.0.   Creatinine trending up 0.79>1.3>1.6   Denies SOB. Abdominal pain 6/10    HM3 VAD Interrogation: Speed:5600 Flow:4.8 Power:3  PI: 4.2   NO PI events.  VAD interrogated personally. Parameters stable.   Objective:   Weight Range: 96 kg Body mass index is 27.17 kg/m.   Vital Signs:   Temp:  [97.7 F (36.5 C)-98.3 F (36.8 C)] 97.7 F (36.5 C) (04/26 0730) Pulse Rate:  [95-98] 95 (04/26 0730) Resp:  [17-22] 18 (04/26 0750) BP: (80-98)/(62-79) 93/77 (04/26 0750) SpO2:  [97 %-98 %] 97 % (04/26 0730) Weight:  [96 kg] 96 kg (04/26 0656) Last BM Date: 09/09/19  Weight change: Filed Weights   09/08/19 0500 09/09/19 0420 09/10/19 0656  Weight: 98.8 kg 97.5 kg 96 kg    Intake/Output:   Intake/Output Summary (Last 24 hours) at 09/10/2019 0911 Last data filed at 09/10/2019 0700 Gross per 24 hour  Intake 580 ml  Output 720 ml  Net -140 ml    Telemetry: SR 90-100   Physical Exam   CVP 10  Physical Exam: GENERAL: No acute distress. HEENT: normal  NECK: Supple, JVP  9-10 .  2+ bilaterally, no bruits.  No  lymphadenopathy or thyromegaly appreciated.   CARDIAC:  Mechanical heart sounds with LVAD hum present.  LUNGS:  Clear to auscultation bilaterally. On room air.  ABDOMEN:  VAC dressing in place. Soft, round, nontender, positive bowel sounds x4.     LVAD exit site:  EXTREMITIES:  Warm and dry, no cyanosis, clubbing, rash or edema . RUE PICC NEUROLOGIC:  Alert and oriented x 3  No aphasia.  No dysarthria.  Affect flat      Labs    CBC Recent Labs    09/09/19 0454 09/10/19 0500  WBC 9.7 9.5  HGB 8.8* 8.5*  HCT 29.9* 29.1*  MCV 97.7 98.0  PLT 200 485   Basic Metabolic Panel Recent Labs    09/09/19 0454 09/10/19 0500  NA 132* 134*  K 4.2 4.6  CL 97* 99  CO2 25 25  GLUCOSE 108* 105*  BUN 23* 25*  CREATININE 1.32* 1.59*  CALCIUM 9.0 9.2  MG  --  1.5*   Liver Function Tests No results for input(s): AST, ALT, ALKPHOS, BILITOT, PROT, ALBUMIN in the last 72 hours. No results for input(s): LIPASE, AMYLASE in the last 72 hours. Cardiac Enzymes No results for input(s): CKTOTAL, CKMB, CKMBINDEX, TROPONINI in the last 72 hours.  BNP: BNP (last 3 results) Recent Labs    03/21/19 1415 04/23/19 1350  BNP 426.9* 709.6*  ProBNP (last 3 results) No results for input(s): PROBNP in the last 8760 hours.   D-Dimer No results for input(s): DDIMER in the last 72 hours. Hemoglobin A1C No results for input(s): HGBA1C in the last 72 hours. Fasting Lipid Panel No results for input(s): CHOL, HDL, LDLCALC, TRIG, CHOLHDL, LDLDIRECT in the last 72 hours. Thyroid Function Tests No results for input(s): TSH, T4TOTAL, T3FREE, THYROIDAB in the last 72 hours.  Invalid input(s): FREET3  Other results:   Imaging    No results found.   Medications:     Scheduled Medications: . acetaminophen  1,000 mg Oral Q6H   Or  . acetaminophen (TYLENOL) oral liquid 160 mg/5 mL  1,000 mg Oral Q6H  . amiodarone  200 mg Oral BID  . vitamin C  250 mg Oral Daily  . bisacodyl  10 mg Oral  Daily  . Chlorhexidine Gluconate Cloth  6 each Topical Daily  . docusate sodium  100 mg Oral TID  . DULoxetine  60 mg Oral Daily  . feeding supplement (ENSURE ENLIVE)  237 mL Oral TID BM  . ferrous sulfate  325 mg Oral Q breakfast  . gabapentin  300 mg Oral TID  . hydrALAZINE  75 mg Oral TID  . insulin aspart  0-24 Units Subcutaneous Q4H  . linaclotide  145 mcg Oral Daily  . mouth rinse  15 mL Mouth Rinse BID  . metoCLOPramide (REGLAN) injection  10 mg Intravenous Q6H  . multivitamin with minerals  1 tablet Oral Daily  . oxyCODONE  10 mg Oral Q8H  . pantoprazole  40 mg Oral BID  . rifabutin  300 mg Oral Daily  . senna-docusate  1 tablet Oral BID  . sildenafil  20 mg Oral TID  . sodium chloride flush  10-40 mL Intracatheter Q12H  . spironolactone  25 mg Oral Daily  . torsemide  20 mg Oral Daily  . warfarin  4 mg Oral ONCE-1600  . Warfarin - Pharmacist Dosing Inpatient   Does not apply q1600  . zinc sulfate  220 mg Oral Daily    Infusions: . sodium chloride Stopped (09/06/19 1456)  . sodium chloride 10 mL/hr at 09/07/19 0721  . ceFEPime (MAXIPIME) IV 2 g (09/10/19 0506)  . dextrose 5 % and 0.45% NaCl Stopped (08/25/19 1414)  . lactated ringers Stopped (09/05/19 1651)    PRN Medications: sodium chloride, sodium chloride, acetaminophen, albuterol, alum & mag hydroxide-simeth, bisacodyl **OR** bisacodyl, fentaNYL (SUBLIMAZE) injection, guaiFENesin-dextromethorphan, HYDROmorphone (DILAUDID) injection, hydrOXYzine, magnesium hydroxide, ondansetron (ZOFRAN) IV, sodium chloride flush, traMADol    Assessment/Plan    1. Fever & leukocytosis -> MSSA bacteremia/ Subxiphoid Abscess  - bcx 2/2 MSSA - ID has changed antibiotics to cefazolin, now has PICC.   - WBC trending down.  Now 13.  Afebrile. - He is not a candidate for VAD exchange due to severe RV failure and noncompliance.  - S/p I&D on 4/5. Wound cultures +MSSA.  - Back to OR 4/7, 4/9, 4/15 , 4/19 debridement and irrigation  of driveline + packing, wound vac in place.    -S/P reconstructive surgery with pec flap 4/21 - Plan for [facility] dressing change tomorrow with Dr Ulice Bold.  - ID recommends 6 weeks of IV abx followed by long-term suppressive oral cephalexin. Now on cefepime and rifabutin. D/w PharmD. Likely switch back to cefazolin prior to d/c.  -  Wean narcotics to oxycodone q6 -> q8 - Afebrile.   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  S/p HM-3 LVAD on 09/06/17 - Maps stable.   - On sildenafil for RV failure.  - CO-OX remains stable.   -Creatinine trending up. Hold torsemide to day. CVP 9-10 - Continue current dose of hydralazine and sildenafil. No imdur with sildenafil.   3. HM3 LVAD 09/06/2017. - VAD interrogated personally. Parameters stable. - LDH 156  today  - INR 2.2 - Continue warfarin. Discussed dosing with PharmD personally. - Off ASA with PUD.    4. Essential HTN - MAPs stable - Continue hydralazine 75 mg tid. - Continue sildenafil 20 mg tid     5. AKI - Creatinine baseline 0.9-1.1  - Creatinine had improved with IVF on admit  - Today creatinine is back up to 1.6. - Hold torsemide  - Check BMET in am.   6. OSA - sleep study with very severe OSA (AHI 69/hr) - non-compliant Bipap in the past, has refused to use.  - He continues to tell respiratory that he will wear the machine himself.   - He is only using intermittently.   7. H/o GI bleed  - 08/2018 EGD completed and showed gastritis and nonbleeding duodenal ulcers, colonoscopy with 5 polyps removed. Protonix was increased to BID.  - No recent GI bleeding - hgb 8.5. Stable.  - Transfuse hgb < 7.  - He will stay off aspirin for now.   8. Tobacco Abuse  - says he quit recently  9. Severe protein calorie malnutrition  - Albumin 2.1 on 4/14 - nutrition seeing   10 Hyperkalemia/hypokalemia -Potassium stable today.   12. Hypomagnesium - 1.5 today. Supp Mag. Give 2 grams  now.     Length of Stay: 25  Tonye Becket, NP  09/10/2019, 9:11 AM  Advanced Heart Failure Team Pager [phone] (M-F; 7a - 4p)  Please contact [facility] for night-coverage after hours (4p -7a ) and weekends on [email or web address] 9:11 AM   Patient seen and examined with the above-signed Advanced Practice Provider and/or Housestaff. I personally reviewed laboratory data, imaging studies and relevant notes. I independently examined the patient and formulated the important aspects of the plan. I have edited the note to reflect any of my changes or salient points. I have personally discussed the plan with the patient and/or family.  Still having some ab pain at wound site but improving. Off PCA. Remains ion IV abx. AF. Creatinine climbing with diuresis. Weight down. VAD interrogated personally. Parameters stable.  General:  NAD.  HEENT: normal  Neck: supple. JVP to jaw Carotids 2+ bilat; no bruits. No lymphadenopathy or thryomegaly appreciated. Cor: LVAD hum.  Lungs: Clear. Abdomen: wound vac ok. obese soft, nontender, non-distended. No hepatosplenomegaly. No bruits or masses. Good bowel sounds. Driveline site clean. Anchor in place.  Extremities: no cyanosis, clubbing, rash. Warm no edema  Neuro: alert & oriented x 3. No focal deficits. Moves all 4 without problem   Improving slowly. Multiple ongoing issues. For wound vac dressing change tomorrow. Wean narcotics. Hold diuretics. INR 2.2. Discussed dosing with PharmD personally. Continue to mobilize.   Arvilla Meres, MD  12:21 PM

## 2019-09-10 NOTE — Progress Notes (Signed)
5 Days Post-Op  Subjective: Doing well, reports some itching near ABRA straps. Surgical pain has improved, manageable on PO medications.  No other complaints. No f/c.  JP drains in place - 55/65 cc of ss output. 120 total. Wound vac in place, good seal noted. Objective: Vital signs in last 24 hours: Temp:  [97.7 F (36.5 C)-98.3 F (36.8 C)] 97.7 F (36.5 C) (04/26 0730) Pulse Rate:  [95-98] 95 (04/26 0730) Resp:  [17-22] 18 (04/26 0750) BP: (80-98)/(62-79) 93/77 (04/26 0750) SpO2:  [97 %-98 %] 97 % (04/26 0730) Weight:  [96 kg] 96 kg (04/26 0656) Last BM Date: 09/09/19  Intake/Output from previous day: 04/25 0701 - 04/26 0700 In: 820 [P.O.:720; IV Piggyback:100] Out: 1470 [Urine:1350; Drains:120] Intake/Output this shift: No intake/output data recorded.  General appearance: alert, cooperative, no distress and resting in bed Head: Normocephalic, without obvious abnormality, atraumatic Chest wall: midline wound vac in place, good seal noted, no drainage in canister. Abra straps in place. JP drains in place along left chest wall. SS drainage in bulbs. Mild TTP. No erthema noted.  Extremities: No swelling of bilateral LE   Lab Results:  CBC Latest Ref Rng & Units 09/10/2019 09/09/2019 09/08/2019  WBC 4.0 - 10.5 K/uL 9.5 9.7 12.6(H)  Hemoglobin 13.0 - 17.0 g/dL 4.3(X) 5.4(M) 8.9(L)  Hematocrit 39.0 - 52.0 % 29.1(L) 29.9(L) 30.0(L)  Platelets 150 - 400 K/uL 219 200 183    BMET Recent Labs    09/09/19 0454 09/10/19 0500  NA 132* 134*  K 4.2 4.6  CL 97* 99  CO2 25 25  GLUCOSE 108* 105*  BUN 23* 25*  CREATININE 1.32* 1.59*  CALCIUM 9.0 9.2   PT/INR Recent Labs    09/09/19 0454 09/10/19 0500  LABPROT 22.5* 24.4*  INR 2.0* 2.2*   ABG No results for input(s): PHART, HCO3 in the last 72 hours.  Invalid input(s): PCO2, PO2  Studies/Results: No results found.  Anti-infectives: Anti-infectives (From admission, onward)   Start     Dose/Rate Route Frequency  Ordered Stop   09/08/19 1616  ceFEPIme (MAXIPIME) 2 g in sodium chloride 0.9 % 100 mL IVPB     2 g 200 mL/hr over 30 Minutes Intravenous Every 8 hours 09/08/19 1616     09/05/19 1455  polymyxin B 500,000 Units, bacitracin 50,000 Units in sodium chloride 0.9 % 500 mL irrigation  Status:  Discontinued       As needed 09/05/19 1455 09/05/19 1713   09/04/19 1330  ceFEPIme (MAXIPIME) 2 g in sodium chloride 0.9 % 100 mL IVPB  Status:  Discontinued     2 g 200 mL/hr over 30 Minutes Intravenous Every 12 hours 09/04/19 1328 09/08/19 1616   09/04/19 1245  ceFEPIme (MAXIPIME) 1 g in sodium chloride 0.9 % 100 mL IVPB  Status:  Discontinued     1 g 200 mL/hr over 30 Minutes Intravenous Every 12 hours 09/04/19 1235 09/04/19 1328   09/04/19 0857  ceFAZolin (ANCEF) IVPB 2g/100 mL premix  Status:  Discontinued     2 g 200 mL/hr over 30 Minutes Intravenous 30 min pre-op 09/04/19 0857 09/04/19 1235   09/03/19 0751  vancomycin (VANCOCIN) powder  Status:  Discontinued       As needed 09/03/19 0751 09/03/19 0833   08/31/19 1200  ceFAZolin (ANCEF) IVPB 2g/100 mL premix  Status:  Discontinued     2 g 200 mL/hr over 30 Minutes Intravenous Every 8 hours 08/31/19 1016 09/04/19 1235   08/31/19 1200  rifabutin (MYCOBUTIN) capsule 300 mg     300 mg Oral Daily 08/31/19 1023     08/31/19 0100  vancomycin (VANCOREADY) IVPB 1250 mg/250 mL  Status:  Discontinued     1,250 mg 166.7 mL/hr over 90 Minutes Intravenous Every 8 hours 08/30/19 2026 08/31/19 1016   08/28/19 2100  vancomycin (VANCOREADY) IVPB 1250 mg/250 mL  Status:  Discontinued     1,250 mg 166.7 mL/hr over 90 Minutes Intravenous Every 8 hours 08/28/19 1422 08/30/19 2026   08/28/19 2000  ceFEPIme (MAXIPIME) 2 g in sodium chloride 0.9 % 100 mL IVPB  Status:  Discontinued     2 g 200 mL/hr over 30 Minutes Intravenous Every 8 hours 08/28/19 1421 08/31/19 1016   08/28/19 0945  ceFEPIme (MAXIPIME) 2 g in sodium chloride 0.9 % 100 mL IVPB  Status:  Discontinued      2 g 200 mL/hr over 30 Minutes Intravenous Every 8 hours 08/28/19 0939 08/28/19 1421   08/28/19 0945  vancomycin (VANCOREADY) IVPB 1750 mg/350 mL     1,750 mg 175 mL/hr over 120 Minutes Intravenous  Once 08/28/19 0944 08/28/19 1409   08/24/19 1022  vancomycin (VANCOCIN) powder  Status:  Discontinued       As needed 08/24/19 1023 08/24/19 1205   08/22/19 0858  vancomycin (VANCOCIN) powder  Status:  Discontinued       As needed 08/22/19 0859 08/22/19 1015   08/20/19 1532  vancomycin (VANCOCIN) powder  Status:  Discontinued       As needed 08/20/19 1533 08/20/19 1605   08/20/19 1200  ceFAZolin (ANCEF) IVPB 2g/100 mL premix  Status:  Discontinued     2 g 200 mL/hr over 30 Minutes Intravenous To ShortStay Surgical 08/20/19 0748 08/20/19 1634   08/20/19 1030  vancomycin (VANCOCIN) 1,000 mg in sodium chloride 0.9 % 1,000 mL irrigation      Irrigation To Surgery 08/20/19 1020 08/20/19 1528   08/20/19 0830  piperacillin-tazobactam (ZOSYN) IVPB 3.375 g     3.375 g 12.5 mL/hr over 240 Minutes Intravenous  Once 08/20/19 0821 08/20/19 1441   08/18/19 1530  rifampin (RIFADIN) capsule 600 mg  Status:  Discontinued     600 mg Oral Daily 08/18/19 1420 08/22/19 1512   08/18/19 1500  ceFAZolin (ANCEF) IVPB 2g/100 mL premix  Status:  Discontinued     2 g 200 mL/hr over 30 Minutes Intravenous Every 8 hours 08/18/19 1420 08/28/19 0933   08/17/19 2200  nafcillin 2 g in sodium chloride 0.9 % 100 mL IVPB  Status:  Discontinued     2 g 200 mL/hr over 30 Minutes Intravenous Every 6 hours 08/17/19 2111 08/18/19 1420   08/17/19 1200  vancomycin (VANCOREADY) IVPB 1250 mg/250 mL  Status:  Discontinued     1,250 mg 166.7 mL/hr over 90 Minutes Intravenous Every 12 hours 08/16/19 1605 08/17/19 1020   08/17/19 1200  vancomycin (VANCOREADY) IVPB 750 mg/150 mL  Status:  Discontinued     750 mg 150 mL/hr over 60 Minutes Intravenous Every 12 hours 08/17/19 1020 08/17/19 2111   08/16/19 1700  ceFEPIme (MAXIPIME) 2 g in  sodium chloride 0.9 % 100 mL IVPB  Status:  Discontinued     2 g 200 mL/hr over 30 Minutes Intravenous Every 8 hours 08/16/19 1600 08/17/19 2111   08/16/19 1615  vancomycin (VANCOREADY) IVPB 2000 mg/400 mL     2,000 mg 200 mL/hr over 120 Minutes Intravenous  Once 08/16/19 1600 08/17/19 0232  Assessment/Plan: s/p Procedure(s): DEBRIDEMENT AND CLOSURE OF ABDOMINAL WOUND MUSCLE FLAP CLOSURE Application Of A-Cell Of Chest/Abdomen  WBC normal, hgb 8.5. Remove wound vac tomorrow.  JP drains in place - 55 and 65 cc output. Pain improving.    LOS: 25 days    Leslee Home, PA-C 09/10/2019

## 2019-09-10 NOTE — Progress Notes (Signed)
5 Days Post-Op Procedure(s) (LRB): DEBRIDEMENT AND CLOSURE OF ABDOMINAL WOUND (N/A) MUSCLE FLAP CLOSURE (N/A) Application Of A-Cell Of Chest/Abdomen (N/A) Subjective: Doing very well JP drains 50-60 cc/day VAC system dry Off heparin Objective: Vital signs in last 24 hours: Temp:  [97.7 F (36.5 C)-98.3 F (36.8 C)] 97.7 F (36.5 C) (04/26 0730) Pulse Rate:  [95-98] 95 (04/26 0730) Cardiac Rhythm: Normal sinus rhythm (04/26 0755) Resp:  [17-22] 18 (04/26 0750) BP: (80-98)/(62-79) 93/77 (04/26 0750) SpO2:  [97 %-98 %] 97 % (04/26 0730) Weight:  [96 kg] 96 kg (04/26 0656)  Hemodynamic parameters for last 24 hours: CVP:  [12 mmHg-22 mmHg] 12 mmHg  Intake/Output from previous day: 04/25 0701 - 04/26 0700 In: 820 [P.O.:720; IV Piggyback:100] Out: 1470 [Urine:1350; Drains:120] Intake/Output this shift: No intake/output data recorded.  nsr Off O2  VAD parameters satisfactory VAC sponge compressed Lab Results: Recent Labs    09/09/19 0454 09/10/19 0500  WBC 9.7 9.5  HGB 8.8* 8.5*  HCT 29.9* 29.1*  PLT 200 219   BMET:  Recent Labs    09/09/19 0454 09/10/19 0500  NA 132* 134*  K 4.2 4.6  CL 97* 99  CO2 25 25  GLUCOSE 108* 105*  BUN 23* 25*  CREATININE 1.32* 1.59*  CALCIUM 9.0 9.2    PT/INR:  Recent Labs    09/10/19 0500  LABPROT 24.4*  INR 2.2*   ABG    Component Value Date/Time   PHART 7.405 09/08/2017 0049   HCO3 29.3 (H) 09/05/2019 1603   TCO2 31 09/05/2019 1603   ACIDBASEDEF 1.0 09/08/2017 0049   O2SAT 67.5 09/10/2019 0513   CBG (last 3)  Recent Labs    09/10/19 0022 09/10/19 0425 09/10/19 0744  GLUCAP 121* 100* 82    Assessment/Plan: S/P Procedure(s) (LRB): DEBRIDEMENT AND CLOSURE OF ABDOMINAL WOUND (N/A) MUSCLE FLAP CLOSURE (N/A) Application Of A-Cell Of Chest/Abdomen (N/A) Change/remove VAC sponge  Tomorrow- will d/w Dr Ulice Bold Prepare for home antibiotics   LOS: 25 days    Kathlee Nations Trigt III 09/10/2019

## 2019-09-10 NOTE — Progress Notes (Addendum)
ANTICOAGULATION CONSULT NOTE - Follow Up Consult  Pharmacy Consult for warfarin Indication: LVAD  No Known Allergies  Patient Measurements: Height: 6\' 2"  (188 cm) Weight: 96 kg (211 lb 10.3 oz) IBW/kg (Calculated) : 82.2  Vital Signs: Temp: 98.4 F (36.9 C) (04/26 1109) Temp Source: Oral (04/26 1109) BP: 101/60 (04/26 1109) Pulse Rate: 87 (04/26 1109)  Labs: Recent Labs    09/08/19 0442 09/08/19 0442 09/08/19 0540 09/08/19 0540 09/09/19 0454 09/10/19 0500  HGB 6.8*   < > 8.9*   < > 8.8* 8.5*  HCT 23.0*   < > 30.0*  --  29.9* 29.1*  PLT 145*   < > 183  --  200 219  LABPROT 22.3*  --   --   --  22.5* 24.4*  INR 2.0*  --   --   --  2.0* 2.2*  CREATININE 0.79  --   --   --  1.32* 1.59*   < > = values in this interval not displayed.    Estimated Creatinine Clearance: 67.5 mL/min (A) (by C-G formula based on SCr of 1.59 mg/dL (H)).   Medical History: Past Medical History:  Diagnosis Date  . Asthma   . CHF (congestive heart failure) (HCC)    a. 09/2016: EF 20-25% with cath showing normal cors  . GERD (gastroesophageal reflux disease)   . History of hiatal hernia   . LVAD (left ventricular assist device) present (HCC)   . OSA on CPAP 09/06/2018   Severe OSA with AHI 68/hr on CPAP at 12cm H2O    Assessment: 48yom with HF s/p LVAD implant HM3 place 4/19 admitted with drive line infection. S/p OR on 4/15 for repeat I&D with application of wound vac.   Warfarin PTA 7.5mg  MW and 5mg  all other days admit INR elevated at 5 - probably acute infection related supra-sensitive.   Warfarin has been on hold since 4/19. Heparin was started on 4/18 with INR <2 and with plans to return to OR on 4/21. Both heparin and warfarin resumed post-OR on 4/22 after discussing with Dr. 5/21. Heparin was stopped on 4/23.  INR today is 2.2, within goal range. Confusion over warfarin dose last night - RN charted against reminder order, patient unsure if took - seems possible given INR  trend. Hgb 8.5, plt 219. LDH 156. No bleeding noted.   Goal of Therapy:  INR 2-2.5 Monitor platelets by anticoagulation protocol: Yes   Plan:  -Warfarin 4 mg tonight  -Daily INR, CBC  Donata Clay, PharmD, BCCCP Clinical Pharmacist  Phone: [phone] 09/10/2019 2:37 PM  Please check AMION for all [facility] Pharmacy phone numbers After 10:00 PM, call Main Pharmacy [phone]

## 2019-09-11 LAB — GLUCOSE, CAPILLARY
Glucose-Capillary: 107 mg/dL — ABNORMAL HIGH (ref 70–99)
Glucose-Capillary: 79 mg/dL (ref 70–99)
Glucose-Capillary: 85 mg/dL (ref 70–99)
Glucose-Capillary: 85 mg/dL (ref 70–99)
Glucose-Capillary: 93 mg/dL (ref 70–99)
Glucose-Capillary: 96 mg/dL (ref 70–99)

## 2019-09-11 LAB — COOXEMETRY PANEL
Carboxyhemoglobin: 2.2 % — ABNORMAL HIGH (ref 0.5–1.5)
Carboxyhemoglobin: 2.3 % — ABNORMAL HIGH (ref 0.5–1.5)
Methemoglobin: 1 % (ref 0.0–1.5)
Methemoglobin: 0.6 % (ref 0.0–1.5)
O2 Saturation: 51.1 %
O2 Saturation: 67.8 %
Total hemoglobin: 10.6 g/dL — ABNORMAL LOW (ref 12.0–16.0)
Total hemoglobin: 8.6 g/dL — ABNORMAL LOW (ref 12.0–16.0)

## 2019-09-11 LAB — MAGNESIUM: Magnesium: 1.9 mg/dL (ref 1.7–2.4)

## 2019-09-11 LAB — PROTIME-INR
INR: 2.9 — ABNORMAL HIGH (ref 0.8–1.2)
Prothrombin Time: 30 seconds — ABNORMAL HIGH (ref 11.4–15.2)

## 2019-09-11 LAB — BASIC METABOLIC PANEL
Anion gap: 10 (ref 5–15)
BUN: 23 mg/dL — ABNORMAL HIGH (ref 6–20)
CO2: 23 mmol/L (ref 22–32)
Calcium: 9.2 mg/dL (ref 8.9–10.3)
Chloride: 101 mmol/L (ref 98–111)
Creatinine, Ser: 1.43 mg/dL — ABNORMAL HIGH (ref 0.61–1.24)
GFR calc Af Amer: >60 mL/min (ref >60)
GFR calc non Af Amer: 58 mL/min — ABNORMAL LOW (ref >60)
Glucose, Bld: 92 mg/dL (ref 70–99)
Potassium: 4.5 mmol/L (ref 3.5–5.1)
Sodium: 134 mmol/L — ABNORMAL LOW (ref 135–145)

## 2019-09-11 LAB — CBC
HCT: 30.5 % — ABNORMAL LOW (ref 39.0–52.0)
Hemoglobin: 9.2 g/dL — ABNORMAL LOW (ref 13.0–17.0)
MCH: 29.6 pg (ref 26.0–34.0)
MCHC: 30.2 g/dL (ref 30.0–36.0)
MCV: 98.1 fL (ref 80.0–100.0)
Platelets: 225 10*3/uL (ref 150–400)
RBC: 3.11 MIL/uL — ABNORMAL LOW (ref 4.22–5.81)
RDW: 17.1 % — ABNORMAL HIGH (ref 11.5–15.5)
WBC: 8 10*3/uL (ref 4.0–10.5)
nRBC: 0 % (ref 0.0–0.2)

## 2019-09-11 LAB — LACTATE DEHYDROGENASE: LDH: 167 U/L (ref 98–192)

## 2019-09-11 MED ORDER — MAGNESIUM SULFATE 2 GM/50ML IV SOLN
2.0000 g | Freq: Once | INTRAVENOUS | Status: AC
  Administered 2019-09-11: 11:00:00 2 g via INTRAVENOUS
  Filled 2019-09-11: qty 50

## 2019-09-11 MED ORDER — WARFARIN SODIUM 2 MG PO TABS
2.0000 mg | ORAL_TABLET | Freq: Once | ORAL | Status: AC
  Administered 2019-09-11: 17:00:00 2 mg via ORAL
  Filled 2019-09-11: qty 1

## 2019-09-11 NOTE — Progress Notes (Signed)
driveline dressing site changed using sterile technique. Anchor replaced. No drainage noted at site. Next dressing change due 5/4

## 2019-09-11 NOTE — Progress Notes (Addendum)
Patient ID: Jonathan Wood, male   DOB: [birth year], 49 y.o.   MRN: [id number]     Advanced Heart Failure Rounding Note  PCP-Cardiologist: Jonathan Bickers, MD    Patient Profile   49 y.o. 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  Subjective:    - 4/5 S/p I&D w/ excisional debridement of subxiphoid abscess. Cultures + Staph Aureus  - 4/7, Returned to OR for debridement, Irrigation and Packing of Abdominal Incision - 4/9 S/P I&D abd wound with Acell and VAC application.  - 4/15 s/p I&D wound with VAC application.  - 4/19 S/P I&D with VAC application.  - 4/21 S/P reconstructive surgery with pec flap. Received steroids in the OR.   Now on cefepime and rifabutin. WBC normal. AF   INR 2.9. Hgb trending up   SCr improved w/ diuretic hold, 1.59>>1.43. Wt up 2 lb. CVP 9. Co-ox 51% (repeating)  Continues w/ mild pain 6/10. Starting to get depressed. Wants to go home.    HM3 VAD Interrogation: [id number] Flow:5.0 Power:4.2  PI: 3.5   NO PI events.  VAD interrogated personally. Parameters stable.   Objective:   Weight Range: 96.8 kg Body mass index is 27.4 kg/m.   Vital Signs:   Temp:  [97.7 F (36.5 C)-98.5 F (36.9 C)] 98.5 F (36.9 C) (04/27 0718) Pulse Rate:  [76-96] 76 (04/27 0718) Resp:  [10-20] 19 (04/27 0718) BP: (82-106)/(60-91) 96/78 (04/27 0718) SpO2:  [99 %-100 %] 99 % (04/27 0718) Weight:  [96.8 kg] 96.8 kg (04/27 0500) Last BM Date: 09/09/19  Weight change: Filed Weights   09/09/19 0420 09/10/19 0656 09/11/19 0500  Weight: 97.5 kg 96 kg 96.8 kg    Intake/Output:   Intake/Output Summary (Last 24 hours) at 09/11/2019 0812 Last data filed at 09/11/2019 0300 Gross per 24 hour  Intake 665 ml  Output 300 ml  Net 365 ml    Telemetry: SR 90-100   Physical Exam   CVP 9  GENERAL: [name] laying  in bed. No acute distress. HEENT: normal  NECK: Supple, JVP  9 cm .  2+ bilaterally, no bruits.  No lymphadenopathy or thyromegaly appreciated.   CARDIAC:  + LVAD HUM  LUNGS:  Clear. No wheezing ABDOMEN:  VAC dressing in place. Soft, round, nontender, positive bowel sounds x4.  LVAD exit site: stable w/ clean dressing  EXTREMITIES:  Warm and dry, no cyanosis, clubbing, rash or edema . RUE PICC NEUROLOGIC:  Alert and oriented x 3  No aphasia.  No dysarthria.  Affect flat      Labs    CBC Recent Labs    09/10/19 0500 09/11/19 0430  WBC 9.5 8.0  HGB 8.5* 9.2*  HCT 29.1* 30.5*  MCV 98.0 98.1  PLT 219 295   Basic Metabolic Panel Recent Labs    09/10/19 0500 09/11/19 0430  NA 134* 134*  K 4.6 4.5  CL 99 101  CO2 25 23  GLUCOSE 105* 92  BUN 25* 23*  CREATININE 1.59* 1.43*  CALCIUM 9.2 9.2  MG 1.5* 1.9   Liver Function Tests No results for input(s): AST, ALT, ALKPHOS, BILITOT, PROT, ALBUMIN in the last 72 hours. No results for input(s): LIPASE, AMYLASE in the last 72 hours. Cardiac Enzymes No results for input(s): CKTOTAL, CKMB, CKMBINDEX, TROPONINI in the last 72 hours.  BNP: BNP (last 3 results)  Recent Labs    03/21/19 1415 04/23/19 1350  BNP 426.9* 709.6*    ProBNP (last 3 results) No results for input(s): PROBNP in the last 8760 hours.   D-Dimer No results for input(s): DDIMER in the last 72 hours. Hemoglobin A1C No results for input(s): HGBA1C in the last 72 hours. Fasting Lipid Panel No results for input(s): CHOL, HDL, LDLCALC, TRIG, CHOLHDL, LDLDIRECT in the last 72 hours. Thyroid Function Tests No results for input(s): TSH, T4TOTAL, T3FREE, THYROIDAB in the last 72 hours.  Invalid input(s): FREET3  Other results:   Imaging    No results found.   Medications:     Scheduled Medications: . amiodarone  200 mg Oral BID  . vitamin C  250 mg Oral Daily  . bisacodyl  10 mg Oral Daily  . Chlorhexidine Gluconate Cloth  6 each Topical Daily    . docusate sodium  100 mg Oral TID  . DULoxetine  60 mg Oral Daily  . feeding supplement (ENSURE ENLIVE)  237 mL Oral TID BM  . ferrous sulfate  325 mg Oral Q breakfast  . gabapentin  300 mg Oral TID  . hydrALAZINE  75 mg Oral TID  . insulin aspart  0-24 Units Subcutaneous Q4H  . linaclotide  145 mcg Oral Daily  . mouth rinse  15 mL Mouth Rinse BID  . multivitamin with minerals  1 tablet Oral Daily  . oxyCODONE  10 mg Oral Q8H  . pantoprazole  40 mg Oral BID  . rifabutin  300 mg Oral Daily  . senna-docusate  1 tablet Oral BID  . sildenafil  20 mg Oral TID  . sodium chloride flush  10-40 mL Intracatheter Q12H  . spironolactone  25 mg Oral Daily  . Warfarin - Pharmacist Dosing Inpatient   Does not apply q1600  . zinc sulfate  220 mg Oral Daily    Infusions: . sodium chloride Stopped (09/06/19 1456)  . sodium chloride 10 mL/hr at 09/07/19 0721  . ceFEPime (MAXIPIME) IV 2 g (09/11/19 0600)  . dextrose 5 % and 0.45% NaCl Stopped (08/25/19 1414)  . lactated ringers Stopped (09/05/19 1651)    PRN Medications: sodium chloride, sodium chloride, acetaminophen, albuterol, alum & mag hydroxide-simeth, bisacodyl **OR** bisacodyl, fentaNYL (SUBLIMAZE) injection, guaiFENesin-dextromethorphan, HYDROmorphone (DILAUDID) injection, hydrOXYzine, magnesium hydroxide, ondansetron (ZOFRAN) IV, sodium chloride flush, traMADol    Assessment/Plan    1. Fever & leukocytosis -> MSSA bacteremia/ Subxiphoid Abscess  - bcx 2/2 MSSA - ID has changed antibiotics to cefazolin, now has PICC.   - WBC trending down.  Now 13.  Afebrile. - He is not a candidate for VAD exchange due to severe RV failure and noncompliance.  - S/p I&D on 4/5. Wound cultures +MSSA.  - Back to OR 4/7, 4/9, 4/15 , 4/19 debridement and irrigation of driveline + packing, wound vac in place.    -S/P reconstructive surgery with pec flap 4/21 - Plan for [facility] dressing change today with Dr Jonathan Wood.  - ID recommends 6 weeks of IV abx  followed by long-term suppressive oral cephalexin. Now on cefepime and rifabutin. D/w PharmD. Likely switch back to cefazolin prior to d/c.  -  Pain improving, continue to wean narcotics to oxycodone q6 -> q8 - Afebrile.   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  S/p HM-3 LVAD on 09/06/17 - Maps stable.   - On sildenafil for RV failure.  - CO-OX low today at 51%.  Will repeat  -Creatinine improving w/ torsemide hold. CVP 9 - Continue current dose of hydralazine and sildenafil. No imdur with sildenafil.   3. HM3 LVAD 09/06/2017. - VAD interrogated personally. Parameters stable. - LDH 167  today  - INR 2.9 - Continue warfarin. Discussed dosing with PharmD personally. - Off ASA with PUD.    4. Essential HTN - MAPs stable - Continue hydralazine 75 mg tid. - Continue sildenafil 20 mg tid     5. AKI - Creatinine baseline 0.9-1.1  - Diuretics on hold for AKI - SCr trending down 1.59>>1.43. - Continue to Hold torsemide  - Follow BMP    6. OSA - sleep study with very severe OSA (AHI 69/hr) - non-compliant Bipap in the past, has refused to use.  - He continues to tell respiratory that he will wear the machine himself.   - He is only using intermittently.   7. H/o GI bleed  - 08/2018 EGD completed and showed gastritis and nonbleeding duodenal ulcers, colonoscopy with 5 polyps removed. Protonix was increased to BID.  - No recent GI bleeding - hgb 9.2. Stable.  - Transfuse hgb < 7.  - He will stay off aspirin for now.   8. Tobacco Abuse  - says he quit recently  9. Severe protein calorie malnutrition  - Albumin 2.1 on 4/14 - nutrition seeing   10 Hyperkalemia/hypokalemia -Potassium stable today.   12. Hypomagnesium - improved w/ supplementation, 1.5>>1.9 today     Length of Stay: [address] PA-C  09/11/2019, 8:12 AM  Advanced Heart Failure Team Pager [phone] (M-F; 7a - 4p)  Please contact [facility]  for night-coverage after hours (4p -7a ) and weekends on [email or web address] 8:12 AM    Patient seen and examined with the above-signed Advanced Practice Provider and/or Housestaff. I personally reviewed laboratory data, imaging studies and relevant notes. I independently examined the patient and formulated the important aspects of the plan. I have edited the note to reflect any of my changes or salient points. I have personally discussed the plan with the patient and/or family.  Will return to OR today for wound vac change. Says site is mildly tender but ok. Says he is depressed and wants to go home. Volume status optimized. CVP 9. However co-ox slightly low. (repeating). VAD interrogated personally. Parameters stable.  General:  NAD.  HEENT: normal  Neck: supple. JVP not elevated.  Carotids 2+ bilat; no bruits. No lymphadenopathy or thryomegaly appreciated. Cor: LVAD hum.  Wound vac looks good  Lungs: Clear. Abdomen: obese soft, nontender, non-distended. No hepatosplenomegaly. No bruits or masses. Good bowel sounds. Driveline site clean. Anchor in place.  Extremities: no cyanosis, clubbing, rash. Warm no edema  Neuro: alert & oriented x 3. No focal deficits. Moves all 4 without problem   Back to OR today as above. Volume status looks good perhaps a little dry. Torsemide on hold with rising creatinine. VAD interrogated personally. Parameters stable. MAPs ok. INR 2.9 Discussed dosing with PharmD personally.  D/w Dr. Donata Clay. Possibly home Thursday. Continue to mobilize. Continue iv abx.   Arvilla Meres, MD  9:07 AM

## 2019-09-11 NOTE — Plan of Care (Signed)
  Problem: Education: Goal: Knowledge of General Education information will improve Description: Including pain rating scale, medication(s)/side effects and non-pharmacologic comfort measures Outcome: Progressing   Problem: Health Behavior/Discharge Planning: Goal: Ability to manage health-related needs will improve Outcome: Progressing   Problem: Clinical Measurements: Goal: Ability to maintain clinical measurements within normal limits will improve Outcome: Progressing Goal: Will remain free from infection Outcome: Progressing Goal: Diagnostic test results will improve Outcome: Progressing Goal: Cardiovascular complication will be avoided Outcome: Progressing   Problem: Nutrition: Goal: Adequate nutrition will be maintained Outcome: Progressing   Problem: Coping: Goal: Level of anxiety will decrease Outcome: Progressing   Problem: Elimination: Goal: Will not experience complications related to bowel motility Outcome: Progressing   Problem: Pain Managment: Goal: General experience of comfort will improve Outcome: Progressing   Problem: Safety: Goal: Ability to remain free from injury will improve Outcome: Progressing   Problem: Skin Integrity: Goal: Risk for impaired skin integrity will decrease Outcome: Progressing   

## 2019-09-11 NOTE — Progress Notes (Addendum)
LVAD Coordinator Rounding Note:  Admitted 08/16/19 by Dr. [name] due to VAD drive line infection/pneumonia/sepsis  HM III LVAD implanted on 09/06/17 by Dr. Cyndia Bent under Destination Therapy criteria.  Pt laying in bed resting. Pain under control this morning.   Dr Prescott Gum at bedside to perform sternal dressing change. Montgomery straps remain in place over sternal wound. See dressing change below.  Spoke with Tomi Bamberger case manager- pt has [facility] arranged for IV antibiotics at hospital d/c.   Spoke with patient's mother Lesleigh Noe re: need to train on daily sternal dressing change and IV antibiotic administration. She states she can come on Thursday 09/13/19 at 1pm for training. She plans to bring one of her friends with her to also learn dressing change.   Vital signs: Temp: 98.5 HR: 99 Auto BP: 96/78 (86) Doppler: 86 Sat: 98% on RA Wt: 227.5>231>235>233.9>236.3.....216>220>219>216>213>213>214.9>218.9>216.4>211.6>213.4 lbs   LVAD interrogation reveals:  Speed: 5600 Flow: 5.0 Power:  4.2w PI: 3.1 Alarms: none Events: none Hematocrit: 27  Fixed speed: 5600  Low speed limit: 5300  Drive Line:  Left abdominal drive line dressing C/D/I with anchor unattached. Anchor replaced. Weekly dressing changes per BS nurse. Dressing change due today- spoke with Alyse Low RN and she will change later today.   Sternal wound: Wound vac removed by Dr Prescott Gum. Site cleansed with betadine swab x 2. Cellerate applied to wound bed. Site covered with saline moistened 2 x 2s and topped with dry 4 x 4s. Dressing covered with tegaderms. Montgomery straps in place. Will need daily dressing changes per Dr Prescott Gum.    Blood products: - 09/04/19>>1 PCs - 09/05/19>>2 FFP  Labs:  LDH trend: 227>230>271........208>187>161>175>168>172>149>169>145>156>167  INR trend: 4.5>5.0>1.7>1.7>1.7>1.8>1.7>2.1>3.4>2.5>2.7>2.1>1.4>1.5>1.4>1.5>1.4>1.8>2.2>2.9    WBC:  27.3>26.2>24>34.6>25.8>20.8......13.0>12.4>14.8>19.3>11.9>13.0>9.5>8.0  Alk Phos: 138> 177>123  AST: 53> 154>33  ALT: 17> 35>13  Infection: - blood cultures 08/16/19>>staphylococcus aureus - blood cultures 08/18/19>> NGTD - Drive line tunnel / sternum 08/20/19>> abundant WBC, gram + cocci, culture- few staph aureus - Drive line AFB 8/4/573>> pending - Drive line/Sternal Fungus culture 08/20/19>> negative - blood cultures 09/02/19>>negative/final - body fluid culture 08/28/19>> negative/final - urine culture 09/04/19>>negative/final - blood cultures 09/04/19>> NG x 2 days; final pending  Anticoagulation Plan: -INR Goal: 2.0 - 2.5  -ASA Dose: none  Adverse Events: - driveline cx 34/4/83>> staph aureus - Admitted for sternal/drive line infection. OR for debridement 08/20/19- + staph aureus wound cultures; further irrigation/debridements done 08/22/19, 08/24/19, 08/30/19, and 09/03/19. Debridement and closure of sternal wound with muscle flap 09/05/19.     Plan/Recommendations:  1. Call VAD pager if any VAD equipment or drive line issues. 2. Weekly drive line dressing changes by bedside nurse.   Emerson Monte RN [place] Coordinator  Office: [phone]  24/7 Pager: [phone]

## 2019-09-11 NOTE — TOC Progression Note (Signed)
Transition of Care [facility]) - Progression Note    Patient Details  Name: Jonathan Wood MRN: [id number] Date of Birth: 01/08/1971  Transition of Care [facility]) CM/SW Contact  Leone Haven, RN Phone Number: 09/11/2019, 11:50 AM  Clinical Narrative:    Patient states he does not have a preference for [facility] agency .  Patient is set up with [facility] for iv abx.  [name] will be doing the medications. Wound vac is in patient's room.  NCM received call from [place] , VAD coordiantor, stating patient will not need the wound vac and that he will be going home in a couple of days.  Will make [name] aware, his mom will be here on Thursday at 1 pm  to learn how to do the dressing change and iv abx.     Barriers to Discharge: Continued Medical Work up  Expected Discharge Plan and Services                                                 Social Determinants of Health (SDOH) Interventions    Readmission Risk Interventions No flowsheet data found.

## 2019-09-11 NOTE — Progress Notes (Signed)
6 Days Post-Op Procedure(s) (LRB): DEBRIDEMENT AND CLOSURE OF ABDOMINAL WOUND (N/A) MUSCLE FLAP CLOSURE (N/A) Application Of A-Cell Of Chest/Abdomen (N/A) Subjective: JP drainage remains minimal with 2 drains in place, sutures secured.  Wound VAC removed off the skin closure today. Incision is healing well with good skin/tissue apposition and no fluid collections or defect.  Small amount of subcutaneous fat was cleaned and covered with a light layer of activated human collagen and wet-to-dry dressing.  ABBRA wound reinforcement system reapplied.  No drainage or any area to culture.   Objective: Vital signs in last 24 hours: Temp:  [97.7 F (36.5 C)-98.5 F (36.9 C)] 98.5 F (36.9 C) (04/27 0718) Pulse Rate:  [76-96] 76 (04/27 0718) Cardiac Rhythm: Sinus tachycardia (04/27 0836) Resp:  [10-20] 19 (04/27 0718) BP: (82-106)/(60-91) 96/78 (04/27 0718) SpO2:  [99 %-100 %] 99 % (04/27 0718) Weight:  [96.8 kg] 96.8 kg (04/27 0500)  Hemodynamic parameters for last 24 hours: CVP:  [14 mmHg-18 mmHg] 16 mmHg  Intake/Output from previous day: 04/26 0701 - 04/27 0700 In: 665 [P.O.:480; I.V.:120; IV Piggyback:65] Out: 300 [Urine:250; Drains:50] Intake/Output this shift: No intake/output data recorded.  EXAM  Upper abdominal wound healing well Lungs clear Normal VAD hum Neuro intact  Lab Results: Recent Labs    09/10/19 0500 09/11/19 0430  WBC 9.5 8.0  HGB 8.5* 9.2*  HCT 29.1* 30.5*  PLT 219 225   BMET:  Recent Labs    09/10/19 0500 09/11/19 0430  NA 134* 134*  K 4.6 4.5  CL 99 101  CO2 25 23  GLUCOSE 105* 92  BUN 25* 23*  CREATININE 1.59* 1.43*  CALCIUM 9.2 9.2    PT/INR:  Recent Labs    09/11/19 0430  LABPROT 30.0*  INR 2.9*   ABG    Component Value Date/Time   PHART 7.405 09/08/2017 0049   HCO3 29.3 (H) 09/05/2019 1603   TCO2 31 09/05/2019 1603   ACIDBASEDEF 1.0 09/08/2017 0049   O2SAT 51.1 09/11/2019 0430   CBG (last 3)  Recent Labs   09/11/19 0006 09/11/19 0414 09/11/19 0749  GLUCAP 79 107* 93    Assessment/Plan: S/P Procedure(s) (LRB): DEBRIDEMENT AND CLOSURE OF ABDOMINAL WOUND (N/A) MUSCLE FLAP CLOSURE (N/A) Application Of A-Cell Of Chest/Abdomen (N/A) Continue daily dressing changes Hope to transition to outpatient care in the next 2 to 3 days after patient's family can be instructed on wound care. Continue Coumadin per pharmacy  LOS: 26 days    Kathlee Nations Trigt III 09/11/2019

## 2019-09-11 NOTE — Progress Notes (Signed)
ANTICOAGULATION CONSULT NOTE - Follow Up Consult  Pharmacy Consult for warfarin Indication: LVAD  No Known Allergies  Patient Measurements: Height: 6\' 2"  (188 cm) Weight: 96.8 kg (213 lb 6.5 oz) IBW/kg (Calculated) : 82.2  Vital Signs: Temp: 98.5 F (36.9 C) (04/27 0718) Temp Source: Other (Comment) (04/27 0718) BP: 96/78 (04/27 0718) Pulse Rate: 76 (04/27 0718)  Labs: Recent Labs    09/09/19 0454 09/09/19 0454 09/10/19 0500 09/11/19 0430  HGB 8.8*   < > 8.5* 9.2*  HCT 29.9*  --  29.1* 30.5*  PLT 200  --  219 225  LABPROT 22.5*  --  24.4* 30.0*  INR 2.0*  --  2.2* 2.9*  CREATININE 1.32*  --  1.59* 1.43*   < > = values in this interval not displayed.    Estimated Creatinine Clearance: 75 mL/min (A) (by C-G formula based on SCr of 1.43 mg/dL (H)).   Medical History: Past Medical History:  Diagnosis Date  . Asthma   . CHF (congestive heart failure) (HCC)    a. 09/2016: EF 20-25% with cath showing normal cors  . GERD (gastroesophageal reflux disease)   . History of hiatal hernia   . LVAD (left ventricular assist device) present (HCC)   . OSA on CPAP 09/06/2018   Severe OSA with AHI 68/hr on CPAP at 12cm H2O    Assessment: 48yom with HF s/p LVAD implant HM3 place 4/19 admitted with drive line infection. S/p OR on 4/15 for repeat I&D with application of wound vac.   Warfarin PTA 7.5mg  MW and 5mg  all other days admit INR elevated at 5 - probably acute infection related supra-sensitive.   Warfarin has been on hold since 4/19. Heparin was started on 4/18 with INR <2 and with plans to return to OR on 4/21. Both heparin and warfarin resumed post-OR on 4/22 after discussing with Dr. 5/21. Heparin was stopped on 4/23.  INR today is 2.9, above goal range. Hgb 9.2, plt 225. LDH 167. No bleeding noted. Back to OR today for wound vac change.  Goal of Therapy:  INR 2-2.5 Monitor platelets by anticoagulation protocol: Yes   Plan:  -Warfarin 2 mg tonight  -Daily INR,  CBC  Donata Clay, PharmD, [facility] PGY2 Cardiology Pharmacy Resident Phone [phone] 09/11/2019       10:00 AM  Please check [email or web address] for unit-specific pharmacist phone numbers

## 2019-09-11 NOTE — Progress Notes (Signed)
Nutrition Follow-up  DOCUMENTATION CODES:   Not applicable  INTERVENTION:   - Continue HS snack  - Continue Ensure Enlive po TID, each supplement provides 350 kcal and 20 grams of protein  - Continue vitamin C 250 mg BID  - Recommend liberalizing diet to Regular to promote increased PO intake and aid in wound healing  NUTRITION DIAGNOSIS:   Inadequate oral intake related to decreased appetite as evidenced by per patient/family report.  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 PMH includes NICM with EF 10%, LVAD, HTN, EtOH abuse  4/05 - s/p I&D w/ excisional debridement of subxiphoid abscess 4/07 - s/p debridement, irrigation and packing of abdominal incision 4/09 - s/p I&D abdominal wound with Acell and VAC application 4/15 - s/p I&D abdominal wound with VAC application 4/19 - s/p I&D abdominal wound with Acell and VAC application 4/21 - s/p debridement and closure of abdominal wound and muscle flap closure, VAC application  Noted plan for pt to return to OR today for VAC change.  Spoke with pt at bedside. Pt reports that he does not have much of an appetite. Pt states that the food leaves a bad taste in his mouth and has no flavor. Recommend liberalizing pt's diet to Regular to promote PO intake and promote healing. Discussed liberalizing pt's diet with PA. PA would like to continue with pt's current diet order at this time.  Pt states that he occasionally consumes Ensure Enlive supplements. RD encouraged increased consumption of supplements to promote healing. Discussed importance of adequate PO intake in wound healing.  Admission  weight: 105 kg Current weight: 96.8 kg  Meal Completion: 0-100%  Medications reviewed and include: vitamin C, dulcolax, colace, Ensure Enlive TID, ferrous sulfate, SSI q 4 hours, MVI with minerals, protonix, senna, spironolactone, warfarin, zinc sulfate, IV abx, IV magnesium sulfate 2 grams once  Labs reviewed: sodium 134, hemoglobin 9.2 CBG's: 79-134 x 24 hours  UOP: 250 ml x 24 hours  Diet Order:   Diet Order            Diet heart healthy/carb modified Room service appropriate? Yes; Fluid consistency: Thin  Diet effective now              EDUCATION NEEDS:   Not appropriate for education at this time  Skin:  Skin Assessment: Skin Integrity Issues: Incisions: abdomen, chest  Last BM:  09/09/19  Height:   Ht Readings from Last 1 Encounters:  08/30/19 6\' 2"  (1.88 m)    Weight:   Wt Readings from Last 1 Encounters:  09/11/19 96.8 kg    BMI:  Body mass index is 27.4 kg/m.  Estimated Nutritional Needs:   Kcal:  2200-2400 kcals  Protein:  115-130 grams  Fluid:  >/= 1.8 L    09/13/19, MS, RD, LDN Inpatient Clinical Dietitian Pager: [phone] Weekend/After Hours: [phone]

## 2019-09-12 LAB — GLUCOSE, CAPILLARY
Glucose-Capillary: 76 mg/dL (ref 70–99)
Glucose-Capillary: 79 mg/dL (ref 70–99)
Glucose-Capillary: 84 mg/dL (ref 70–99)
Glucose-Capillary: 88 mg/dL (ref 70–99)
Glucose-Capillary: 90 mg/dL (ref 70–99)
Glucose-Capillary: 92 mg/dL (ref 70–99)
Glucose-Capillary: 99 mg/dL (ref 70–99)

## 2019-09-12 LAB — CBC
HCT: 29.8 % — ABNORMAL LOW (ref 39.0–52.0)
Hemoglobin: 8.8 g/dL — ABNORMAL LOW (ref 13.0–17.0)
MCH: 28.7 pg (ref 26.0–34.0)
MCHC: 29.5 g/dL — ABNORMAL LOW (ref 30.0–36.0)
MCV: 97.1 fL (ref 80.0–100.0)
Platelets: 216 10*3/uL (ref 150–400)
RBC: 3.07 MIL/uL — ABNORMAL LOW (ref 4.22–5.81)
RDW: 16.9 % — ABNORMAL HIGH (ref 11.5–15.5)
WBC: 8 10*3/uL (ref 4.0–10.5)
nRBC: 0 % (ref 0.0–0.2)

## 2019-09-12 LAB — BASIC METABOLIC PANEL
Anion gap: 15 (ref 5–15)
BUN: 17 mg/dL (ref 6–20)
CO2: 16 mmol/L — ABNORMAL LOW (ref 22–32)
Calcium: 8.9 mg/dL (ref 8.9–10.3)
Chloride: 100 mmol/L (ref 98–111)
Creatinine, Ser: 1.2 mg/dL (ref 0.61–1.24)
GFR calc Af Amer: >60 mL/min (ref >60)
GFR calc non Af Amer: >60 mL/min (ref >60)
Glucose, Bld: 82 mg/dL (ref 70–99)
Potassium: 4.4 mmol/L (ref 3.5–5.1)
Sodium: 131 mmol/L — ABNORMAL LOW (ref 135–145)

## 2019-09-12 LAB — MAGNESIUM: Magnesium: 1.9 mg/dL (ref 1.7–2.4)

## 2019-09-12 LAB — COOXEMETRY PANEL
Carboxyhemoglobin: 2.4 % — ABNORMAL HIGH (ref 0.5–1.5)
Methemoglobin: 0.9 % (ref 0.0–1.5)
O2 Saturation: 61.4 %
Total hemoglobin: 13.8 g/dL (ref 12.0–16.0)

## 2019-09-12 LAB — PROTIME-INR
INR: 3.6 — ABNORMAL HIGH (ref 0.8–1.2)
Prothrombin Time: 34.6 seconds — ABNORMAL HIGH (ref 11.4–15.2)

## 2019-09-12 LAB — LACTATE DEHYDROGENASE: LDH: 180 U/L (ref 98–192)

## 2019-09-12 MED ORDER — MAGNESIUM SULFATE 2 GM/50ML IV SOLN
2.0000 g | Freq: Once | INTRAVENOUS | Status: AC
  Administered 2019-09-12: 10:00:00 2 g via INTRAVENOUS
  Filled 2019-09-12: qty 50

## 2019-09-12 NOTE — Progress Notes (Signed)
ANTICOAGULATION CONSULT NOTE - Follow Up Consult  Pharmacy Consult for warfarin Indication: LVAD  No Known Allergies  Patient Measurements: Height: 6\' 2"  (188 cm) Weight: 95.5 kg (210 lb 8.6 oz) IBW/kg (Calculated) : 82.2  Vital Signs: Temp: 98.2 F (36.8 C) (04/28 0737) Temp Source: Oral (04/28 0737) BP: 101/65 (04/28 0737) Pulse Rate: 96 (04/28 0344)  Labs: Recent Labs    09/10/19 0500 09/10/19 0500 09/11/19 0430 09/12/19 0406 09/12/19 0450  HGB 8.5*   < > 9.2* 8.8*  --   HCT 29.1*  --  30.5* 29.8*  --   PLT 219  --  225 216  --   LABPROT 24.4*  --  30.0*  --  34.6*  INR 2.2*  --  2.9*  --  3.6*  CREATININE 1.59*  --  1.43* 1.20  --    < > = values in this interval not displayed.    Estimated Creatinine Clearance: 89.4 mL/min (by C-G formula based on SCr of 1.2 mg/dL).   Medical History: Past Medical History:  Diagnosis Date  . Asthma   . CHF (congestive heart failure) (HCC)    a. 09/2016: EF 20-25% with cath showing normal cors  . GERD (gastroesophageal reflux disease)   . History of hiatal hernia   . LVAD (left ventricular assist device) present (HCC)   . OSA on CPAP 09/06/2018   Severe OSA with AHI 68/hr on CPAP at 12cm H2O    Assessment: 48yom with HF s/p LVAD implant HM3 place 4/19 admitted with drive line infection. S/p OR on 4/15 for repeat I&D with application of wound vac.   Warfarin PTA 7.5mg  MW and 5mg  all other days admit INR elevated at 5 - probably acute infection related supra-sensitive.   Warfarin has been on hold since 4/19. Heparin was started on 4/18 with INR <2 and with plans to return to OR on 4/21. Both heparin and warfarin resumed post-OR on 4/22 after discussing with Dr. 5/21. Heparin was stopped on 4/23.  INR today is 3.6, above goal range. Hgb 8.8, plt 216. LDH 180. No bleeding noted.   Goal of Therapy:  INR 2-2.5 Monitor platelets by anticoagulation protocol: Yes   Plan: -Hold warfarin tonight  -Daily INR,  CBC  Donata Clay, PharmD, [facility] PGY2 Cardiology Pharmacy Resident Phone [phone] 09/12/2019       9:31 AM  Please check [email or web address] for unit-specific pharmacist phone numbers

## 2019-09-12 NOTE — Plan of Care (Signed)
  Problem: Education: Goal: Knowledge of General Education information will improve Description: Including pain rating scale, medication(s)/side effects and non-pharmacologic comfort measures Outcome: Progressing   Problem: Health Behavior/Discharge Planning: Goal: Ability to manage health-related needs will improve Outcome: Progressing   Problem: Clinical Measurements: Goal: Ability to maintain clinical measurements within normal limits will improve Outcome: Progressing Goal: Will remain free from infection Outcome: Progressing Goal: Diagnostic test results will improve Outcome: Progressing Goal: Cardiovascular complication will be avoided Outcome: Progressing   Problem: Nutrition: Goal: Adequate nutrition will be maintained Outcome: Progressing   Problem: Coping: Goal: Level of anxiety will decrease Outcome: Progressing   Problem: Elimination: Goal: Will not experience complications related to bowel motility Outcome: Progressing   Problem: Pain Managment: Goal: General experience of comfort will improve Outcome: Progressing   Problem: Safety: Goal: Ability to remain free from injury will improve Outcome: Progressing   Problem: Skin Integrity: Goal: Risk for impaired skin integrity will decrease Outcome: Progressing   Problem: Cardiac: Goal: LVAD will function as expected and patient will experience no clinical alarms Outcome: Progressing   Problem: Education: Goal: Patient will understand all VAD equipment and how it functions Outcome: Progressing Goal: Patient will be able to verbalize current INR target range and antiplatelet therapy for discharge home Outcome: Progressing   Problem: Cardiac: Goal: LVAD will function as expected and patient will experience no clinical alarms Outcome: Progressing   Problem: Education: Goal: Knowledge of the prescribed therapeutic regimen will improve Outcome: Progressing   Problem: Fluid Volume: Goal: Risk for excess  fluid volume will decrease Outcome: Progressing   Problem: Education: Goal: Required Educational Video(s) Outcome: Progressing   Problem: Clinical Measurements: Goal: Postoperative complications will be avoided or minimized Outcome: Progressing   Problem: Skin Integrity: Goal: Demonstration of wound healing without infection will improve Outcome: Progressing   

## 2019-09-12 NOTE — Progress Notes (Signed)
LVAD Coordinator Rounding Note:  Admitted 08/16/19 by Dr. [name] due to VAD drive line infection/pneumonia/sepsis  HM III LVAD implanted on 09/06/17 by Dr. Laneta Simmers under Destination Therapy criteria.  Pt laying in bed resting. Denies pain currently.   Plan for patient's mother Delray Alt to come tomorrow 09/13/19 at 1pm for training on sternal wound dressing change with VAD coordinator, and IV antibiotic administration with [name]. She plans to bring one of her friends with her to also learn dressing change and IV antibiotic administration.   Vital signs: Temp: 98.4 HR: 99 Auto BP: 95/80 (86) Doppler: 84 Sat: 98% on RA Wt: 227.5>231>235>233.9>236.3.....216>220>219>216>213>213>214.9>218.9>216.4>211.6>213.4>210.5 lbs   LVAD interrogation reveals:  Speed: 5600 Flow: 5.1 Power:  4.3w PI: 3.4 Alarms: none Events: none Hematocrit: 27  Fixed speed: 5600  Low speed limit: 5300  Drive Line:  Left abdominal drive line dressing C/D/I with anchor unattached. Anchor replaced. Weekly dressing changes per BS nurse. Dressing change due 09/18/19.  Sternal wound: Existing dressing removed using sterile technique. Site cleansed with betadine swab x 2. Site covered with saline moistened 2 x 2s and topped with dry 4 x 4s. Dressing covered with tegaderms. Montgomery straps in place. Will need daily dressing changes per Dr Donata Clay.     Blood products: - 09/04/19>>1 PCs - 09/05/19>>2 FFP  Labs:  LDH trend: 227>230>271........208>187>161>175>168>172>149>169>145>156>167>180  INR trend: 4.5>5.0>1.7>1.7>1.7>1.8>1.7>2.1>3.4>2.5>2.7>2.1>1.4>1.5>1.4>1.5>1.4>1.8>2.2>2.9>3.6    WBC: 27.3>26.2>24>34.6>25.8>20.8......13.0>12.4>14.8>19.3>11.9>13.0>9.5>8.0  Infection: - blood cultures 08/16/19>>staphylococcus aureus - blood cultures 08/18/19>> NGTD - Drive line tunnel / sternum 08/20/19>> abundant WBC, gram + cocci, culture- few staph aureus - Drive line AFB 9/5/638>> pending - Drive line/Sternal Fungus  culture 08/20/19>> negative - blood cultures 09/02/19>>negative/final - body fluid culture 08/28/19>> negative/final - urine culture 09/04/19>>negative/final - blood cultures 09/04/19>> negative/ final  Anticoagulation Plan: -INR Goal: 2.0 - 2.5  -ASA Dose: none  Adverse Events: - driveline cx 04/23/19>> staph aureus - Admitted for sternal/drive line infection. OR for debridement 08/20/19- + staph aureus wound cultures; further irrigation/debridements done 08/22/19, 08/24/19, 08/30/19, and 09/03/19. Debridement and closure of sternal wound with muscle flap 09/05/19.     Plan/Recommendations:  1. Call VAD pager if any VAD equipment or drive line issues. 2. Weekly drive line dressing changes by bedside nurse.  3. Daily sternal wound dressing changes  4. Pt's mother coming for teach with VAD coordinator tomorrow at 1pm  Alyce Pagan RN VAD Coordinator  Office: [phone]  24/7 Pager: [phone]

## 2019-09-12 NOTE — Progress Notes (Addendum)
Patient ID: Jonathan Wood, male   DOB: 1971/04/02, 49 y.o.   MRN: [id number]     Advanced Heart Failure Rounding Note  PCP-Cardiologist: Arvilla Meres, MD    Patient Profile   49 y.o. 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  Subjective:    - 4/5 S/p I&D w/ excisional debridement of subxiphoid abscess. Cultures + Staph Aureus  - 4/7, Returned to OR for debridement, Irrigation and Packing of Abdominal Incision - 4/9 S/P I&D abd wound with Acell and VAC application.  - 4/15 s/p I&D wound with VAC application.  - 4/19 S/P I&D with VAC application.  - 4/21 S/P reconstructive surgery with pec flap. Received steroids in the OR.   Now on cefepime and rifabutin.   INR 3.6 . Hgb 8.8   Ongoing pain but controlled with oxycodone. Wants to go home.   HM3 VAD Interrogation: Speed:5650 Flow:5.0 Power:4  PI: 4.2    NO PI events.  VAD interrogated personally. Parameters stable.   Objective:   Weight Range: 95.5 kg Body mass index is 27.03 kg/m.   Vital Signs:   Temp:  [97.6 F (36.4 C)-98.3 F (36.8 C)] 98.2 F (36.8 C) (04/28 0737) Pulse Rate:  [93-98] 96 (04/28 0344) Resp:  [14-20] 19 (04/28 0737) BP: (84-104)/(55-89) 101/65 (04/28 0737) SpO2:  [98 %-100 %] 99 % (04/28 0344) Weight:  [95.5 kg] 95.5 kg (04/28 0344) Last BM Date: 09/09/19  Weight change: Filed Weights   09/10/19 0656 09/11/19 0500 09/12/19 0344  Weight: 96 kg 96.8 kg 95.5 kg    Intake/Output:   Intake/Output Summary (Last 24 hours) at 09/12/2019 0847 Last data filed at 09/12/2019 0600 Gross per 24 hour  Intake 340 ml  Output 1200 ml  Net -860 ml    Telemetry: SR 90-100   Physical Exam  CVP 9-10  Physical Exam: GENERAL:NAD HEENT: normal  NECK: Supple, JVP 9-10  .  2+ bilaterally, no bruits.  No lymphadenopathy or thyromegaly  appreciated.   CARDIAC:  Mechanical heart sounds with LVAD hum present.  LUNGS:  Clear to auscultation bilaterally.  ABDOMEN:  Dressing intact with JP x2. Soft, round, tender, positive bowel sounds x4.     LVAD exit site: well-healed and incorporated.  Dressing dry and intact.  No erythema or drainage.  Stabilization device present and accurately applied.  Driveline dressing is being changed daily per sterile technique. EXTREMITIES:  Warm and dry, no cyanosis, clubbing, rash or edema  NEUROLOGIC:  Alert and oriented x 3 .    No aphasia.  No dysarthria.  Affect pleasant.        Labs    CBC Recent Labs    09/11/19 0430 09/12/19 0406  WBC 8.0 8.0  HGB 9.2* 8.8*  HCT 30.5* 29.8*  MCV 98.1 97.1  PLT 225 216   Basic Metabolic Panel Recent Labs    94/85/46 0430 09/12/19 0406  NA 134* 131*  K 4.5 4.4  CL 101 100  CO2 23 16*  GLUCOSE 92 82  BUN 23* 17  CREATININE 1.43* 1.20  CALCIUM 9.2 8.9  MG 1.9 1.9   Liver Function Tests No results for input(s): AST, ALT, ALKPHOS, BILITOT, PROT, ALBUMIN in the last 72 hours. No results for input(s): LIPASE, AMYLASE in the last 72 hours. Cardiac Enzymes No results for input(s): CKTOTAL, CKMB, CKMBINDEX, TROPONINI in the  last 72 hours.  BNP: BNP (last 3 results) Recent Labs    03/21/19 1415 04/23/19 1350  BNP 426.9* 709.6*    ProBNP (last 3 results) No results for input(s): PROBNP in the last 8760 hours.   D-Dimer No results for input(s): DDIMER in the last 72 hours. Hemoglobin A1C No results for input(s): HGBA1C in the last 72 hours. Fasting Lipid Panel No results for input(s): CHOL, HDL, LDLCALC, TRIG, CHOLHDL, LDLDIRECT in the last 72 hours. Thyroid Function Tests No results for input(s): TSH, T4TOTAL, T3FREE, THYROIDAB in the last 72 hours.  Invalid input(s): FREET3  Other results:   Imaging    No results found.   Medications:     Scheduled Medications: . amiodarone  200 mg Oral BID  . vitamin C  250 mg  Oral Daily  . bisacodyl  10 mg Oral Daily  . Chlorhexidine Gluconate Cloth  6 each Topical Daily  . docusate sodium  100 mg Oral TID  . DULoxetine  60 mg Oral Daily  . feeding supplement (ENSURE ENLIVE)  237 mL Oral TID BM  . ferrous sulfate  325 mg Oral Q breakfast  . gabapentin  300 mg Oral TID  . hydrALAZINE  75 mg Oral TID  . insulin aspart  0-24 Units Subcutaneous Q4H  . linaclotide  145 mcg Oral Daily  . mouth rinse  15 mL Mouth Rinse BID  . multivitamin with minerals  1 tablet Oral Daily  . oxyCODONE  10 mg Oral Q8H  . pantoprazole  40 mg Oral BID  . rifabutin  300 mg Oral Daily  . senna-docusate  1 tablet Oral BID  . sildenafil  20 mg Oral TID  . sodium chloride flush  10-40 mL Intracatheter Q12H  . spironolactone  25 mg Oral Daily  . Warfarin - Pharmacist Dosing Inpatient   Does not apply q1600  . zinc sulfate  220 mg Oral Daily    Infusions: . sodium chloride Stopped (09/06/19 1456)  . sodium chloride 10 mL/hr at 09/07/19 0721  . ceFEPime (MAXIPIME) IV 2 g (09/12/19 0546)  . dextrose 5 % and 0.45% NaCl Stopped (08/25/19 1414)  . lactated ringers Stopped (09/05/19 1651)    PRN Medications: sodium chloride, sodium chloride, acetaminophen, albuterol, alum & mag hydroxide-simeth, bisacodyl **OR** bisacodyl, fentaNYL (SUBLIMAZE) injection, guaiFENesin-dextromethorphan, hydrOXYzine, magnesium hydroxide, ondansetron (ZOFRAN) IV, sodium chloride flush, traMADol    Assessment/Plan    1. Fever & leukocytosis -> MSSA bacteremia/ Subxiphoid Abscess  - bcx 2/2 MSSA - ID has changed antibiotics to cefazolin, now has PICC.   - WBC trending down.  Now 13.  Afebrile. - He is not a candidate for VAD exchange due to severe RV failure and noncompliance.  - S/p I&D on 4/5. Wound cultures +MSSA.  - Back to OR 4/7, 4/9, 4/15 , 4/19 debridement and irrigation of driveline + packing, wound vac in place.    -S/P reconstructive surgery with pec flap 4/21 -VAC removed 4/27  - Afebrile/  WBC 8.  - ID recommends 6 weeks of IV abx followed by long-term suppressive oral cephalexin.  - Now on cefepime and rifabutin.  - Plan to switch to cefazolin prior to d/c.   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  S/p HM-3 LVAD on 09/06/17 - maps stable.  - On sildenafil for RV failure.  - CO-OX stable.  - Continue to hold torsemide. May need to restart tomorrow. At home he was  on 20 mg every other day.  - Renal function stable.  - Continue current dose of hydralazine and sildenafil. No imdur with sildenafil.   3. HM3 LVAD 09/06/2017. - VAD interrogated personally. Parameters stable. - LDH 180  - INR 3.6 .  - Continue warfarin. Discussed dosing with PharmD personally. - Off ASA with PUD.    4. Essential HTN - Stable.  - Continue hydralazine 75 mg tid. - Continue sildenafil 20 mg tid     5. AKI - Creatinine baseline 0.9-1.1  - Diuretics on hold for AKI - SCr Stable today 1.2  - Continue to hold.    6. OSA - sleep study with very severe OSA (AHI 69/hr) - non-compliant Bipap in the past, has refused to use.  - He continues to tell respiratory that he will wear the machine himself.   - He is only using intermittently.   7. H/o GI bleed  - 08/2018 EGD completed and showed gastritis and nonbleeding duodenal ulcers, colonoscopy with 5 polyps removed. Protonix was increased to BID.  - No recent GI bleeding - hgb 8.8 . Stable.  - Transfuse hgb < 7.  - He will stay off aspirin for now.   8. Tobacco Abuse  - says he quit recently  47. Severe protein calorie malnutrition  - Albumin 2.1 on 4/14 - nutrition seeing   10 Hyperkalemia/hypokalemia - potassium stable.   12. Hypomagnesium - improved w/ supplementation, 1.5>>1.9 >>1.9  - Give 2 grams Mag now.     Discussed with VAD coordinator- Plan for his mom to return tomorrow for wound care education and home antibiotic education. Home once he can safely go home with wound care  and IV antibiotics.   Length of Stay: [place], NP  09/12/2019, 8:47 AM  Advanced Heart Failure Team Pager [phone] (M-F; 7a - 4p)  Please contact [facility] for night-coverage after hours (4p -7a ) and weekends on [email or web address] 8:47 AM   Patient seen and examined with the above-signed Advanced Practice Provider and/or Housestaff. I personally reviewed laboratory data, imaging studies and relevant notes. I independently examined the patient and formulated the important aspects of the plan. I have edited the note to reflect any of my changes or salient points. I have personally discussed the plan with the patient and/or family.  Returned to OR yesterday for washout and wound vac removal. Remains on IV abx. AF. Still with mild pain but tolerable. Co-ox 61%. Volume status ok. Off torsemide due to AKI. Renal function now improved. Likely restart tomorrow. MAPs ok. VAD interrogated personally. Parameters stable.  General:  NAD.  HEENT: normal  Neck: supple. JVP 10.  Carotids 2+ bilat; no bruits. No lymphadenopathy or thryomegaly appreciated. Cor: LVAD hum.  Chest wound packed with JP drains in place Lungs: Clear. Abdomen: obese soft, nontender, non-distended. No hepatosplenomegaly. No bruits or masses. Good bowel sounds. Driveline site clean. Anchor in place.  Extremities: no cyanosis, clubbing, rash. Warm no edema  Neuro: alert & oriented x 3. No focal deficits. Moves all 4 without problem   S/p wound washout and wound vac removal. Remains on IV abx. Pain controlled. VOlume status ok. VAD interrogated personally. Parameters stable.  INR 3.6. Discussed dosing with PharmD personally.  Will need family teaching on wound care prior to plans for d/c.   [name], MD  10:54 AM

## 2019-09-12 NOTE — Progress Notes (Signed)
7 Days Post-Op  Subjective: Patient is doing well, sitting up in bed on eval. No complaints. No pain. All questions answered.  Dr. Ulice Bold and I were both present today during evaluation.  Objective: Vital signs in last 24 hours: Temp:  [97.6 F (36.4 C)-98.4 F (36.9 C)] 98.4 F (36.9 C) (04/28 1600) Pulse Rate:  [93-96] 96 (04/28 0344) Resp:  [14-19] 18 (04/28 1600) BP: (91-104)/(55-89) 91/73 (04/28 1600) SpO2:  [98 %-99 %] 99 % (04/28 0344) Weight:  [95.5 kg] 95.5 kg (04/28 0344) Last BM Date: 09/10/19  Intake/Output from previous day: 04/27 0701 - 04/28 0700 In: 340 [I.V.:240; IV Piggyback:100] Out: 1200 [Urine:1100; Drains:100] Intake/Output this shift: Total I/O In: -  Out: 200 [Urine:200]  General appearance: alert, cooperative, no distress and sitting up in bed Head: Normocephalic, without obvious abnormality, atraumatic Chest wall: Midline incision covered with dressing, no drainage noted. Abra straps in place. JP drains in place - serosanguinous drainage noted. No erythema noted. No swelling noted. Minimal TTP. LVAD drive line in place.  Lab Results:  CBC Latest Ref Rng & Units 09/12/2019 09/11/2019 09/10/2019  WBC 4.0 - 10.5 K/uL 8.0 8.0 9.5  Hemoglobin 13.0 - 17.0 g/dL 1.6(X) 0.9(U) 0.4(V)  Hematocrit 39.0 - 52.0 % 29.8(L) 30.5(L) 29.1(L)  Platelets 150 - 400 K/uL 216 225 219    BMET Recent Labs    09/11/19 0430 09/12/19 0406  NA 134* 131*  K 4.5 4.4  CL 101 100  CO2 23 16*  GLUCOSE 92 82  BUN 23* 17  CREATININE 1.43* 1.20  CALCIUM 9.2 8.9   PT/INR Recent Labs    09/11/19 0430 09/12/19 0450  LABPROT 30.0* 34.6*  INR 2.9* 3.6*   ABG No results for input(s): PHART, HCO3 in the last 72 hours.  Invalid input(s): PCO2, PO2  Studies/Results: No results found.  Anti-infectives: Anti-infectives (From admission, onward)   Start     Dose/Rate Route Frequency Ordered Stop   09/08/19 1616  ceFEPIme (MAXIPIME) 2 g in sodium chloride 0.9 % 100 mL  IVPB     2 g 200 mL/hr over 30 Minutes Intravenous Every 8 hours 09/08/19 1616     09/05/19 1455  polymyxin B 500,000 Units, bacitracin 50,000 Units in sodium chloride 0.9 % 500 mL irrigation  Status:  Discontinued       As needed 09/05/19 1455 09/05/19 1713   09/04/19 1330  ceFEPIme (MAXIPIME) 2 g in sodium chloride 0.9 % 100 mL IVPB  Status:  Discontinued     2 g 200 mL/hr over 30 Minutes Intravenous Every 12 hours 09/04/19 1328 09/08/19 1616   09/04/19 1245  ceFEPIme (MAXIPIME) 1 g in sodium chloride 0.9 % 100 mL IVPB  Status:  Discontinued     1 g 200 mL/hr over 30 Minutes Intravenous Every 12 hours 09/04/19 1235 09/04/19 1328   09/04/19 0857  ceFAZolin (ANCEF) IVPB 2g/100 mL premix  Status:  Discontinued     2 g 200 mL/hr over 30 Minutes Intravenous 30 min pre-op 09/04/19 0857 09/04/19 1235   09/03/19 0751  vancomycin (VANCOCIN) powder  Status:  Discontinued       As needed 09/03/19 0751 09/03/19 0833   08/31/19 1200  ceFAZolin (ANCEF) IVPB 2g/100 mL premix  Status:  Discontinued     2 g 200 mL/hr over 30 Minutes Intravenous Every 8 hours 08/31/19 1016 09/04/19 1235   08/31/19 1200  rifabutin (MYCOBUTIN) capsule 300 mg     300 mg Oral Daily 08/31/19 1023  08/31/19 0100  vancomycin (VANCOREADY) IVPB 1250 mg/250 mL  Status:  Discontinued     1,250 mg 166.7 mL/hr over 90 Minutes Intravenous Every 8 hours 08/30/19 2026 08/31/19 1016   08/28/19 2100  vancomycin (VANCOREADY) IVPB 1250 mg/250 mL  Status:  Discontinued     1,250 mg 166.7 mL/hr over 90 Minutes Intravenous Every 8 hours 08/28/19 1422 08/30/19 2026   08/28/19 2000  ceFEPIme (MAXIPIME) 2 g in sodium chloride 0.9 % 100 mL IVPB  Status:  Discontinued     2 g 200 mL/hr over 30 Minutes Intravenous Every 8 hours 08/28/19 1421 08/31/19 1016   08/28/19 0945  ceFEPIme (MAXIPIME) 2 g in sodium chloride 0.9 % 100 mL IVPB  Status:  Discontinued     2 g 200 mL/hr over 30 Minutes Intravenous Every 8 hours 08/28/19 0939 08/28/19 1421    08/28/19 0945  vancomycin (VANCOREADY) IVPB 1750 mg/350 mL     1,750 mg 175 mL/hr over 120 Minutes Intravenous  Once 08/28/19 0944 08/28/19 1409   08/24/19 1022  vancomycin (VANCOCIN) powder  Status:  Discontinued       As needed 08/24/19 1023 08/24/19 1205   08/22/19 0858  vancomycin (VANCOCIN) powder  Status:  Discontinued       As needed 08/22/19 0859 08/22/19 1015   08/20/19 1532  vancomycin (VANCOCIN) powder  Status:  Discontinued       As needed 08/20/19 1533 08/20/19 1605   08/20/19 1200  ceFAZolin (ANCEF) IVPB 2g/100 mL premix  Status:  Discontinued     2 g 200 mL/hr over 30 Minutes Intravenous To ShortStay Surgical 08/20/19 0748 08/20/19 1634   08/20/19 1030  vancomycin (VANCOCIN) 1,000 mg in sodium chloride 0.9 % 1,000 mL irrigation      Irrigation To Surgery 08/20/19 1020 08/20/19 1528   08/20/19 0830  piperacillin-tazobactam (ZOSYN) IVPB 3.375 g     3.375 g 12.5 mL/hr over 240 Minutes Intravenous  Once 08/20/19 0821 08/20/19 1441   08/18/19 1530  rifampin (RIFADIN) capsule 600 mg  Status:  Discontinued     600 mg Oral Daily 08/18/19 1420 08/22/19 1512   08/18/19 1500  ceFAZolin (ANCEF) IVPB 2g/100 mL premix  Status:  Discontinued     2 g 200 mL/hr over 30 Minutes Intravenous Every 8 hours 08/18/19 1420 08/28/19 0933   08/17/19 2200  nafcillin 2 g in sodium chloride 0.9 % 100 mL IVPB  Status:  Discontinued     2 g 200 mL/hr over 30 Minutes Intravenous Every 6 hours 08/17/19 2111 08/18/19 1420   08/17/19 1200  vancomycin (VANCOREADY) IVPB 1250 mg/250 mL  Status:  Discontinued     1,250 mg 166.7 mL/hr over 90 Minutes Intravenous Every 12 hours 08/16/19 1605 08/17/19 1020   08/17/19 1200  vancomycin (VANCOREADY) IVPB 750 mg/150 mL  Status:  Discontinued     750 mg 150 mL/hr over 60 Minutes Intravenous Every 12 hours 08/17/19 1020 08/17/19 2111   08/16/19 1700  ceFEPIme (MAXIPIME) 2 g in sodium chloride 0.9 % 100 mL IVPB  Status:  Discontinued     2 g 200 mL/hr over 30  Minutes Intravenous Every 8 hours 08/16/19 1600 08/17/19 2111   08/16/19 1615  vancomycin (VANCOREADY) IVPB 2000 mg/400 mL     2,000 mg 200 mL/hr over 120 Minutes Intravenous  Once 08/16/19 1600 08/17/19 0232      Assessment/Plan: s/p Procedure(s): DEBRIDEMENT AND CLOSURE OF ABDOMINAL WOUND MUSCLE FLAP CLOSURE Application Of A-Cell Of Chest/Abdomen  Patient  is doing well.  Abra straps removed, dressing in place - recommend mepilex border dressing - nursing staff to order from OR.   Stable for discharge from plastic surgery stance, will follow up in clinic in 1 week to manage JP drains.  Call w/ questions/concerns.   LOS: 27 days    Charlies Constable, PA-C 09/12/2019

## 2019-09-13 ENCOUNTER — Inpatient Hospital Stay (HOSPITAL_COMMUNITY): Payer: Medicare Other

## 2019-09-13 ENCOUNTER — Encounter (HOSPITAL_COMMUNITY): Payer: Medicaid Other

## 2019-09-13 LAB — GLUCOSE, CAPILLARY
Glucose-Capillary: 80 mg/dL (ref 70–99)
Glucose-Capillary: 105 mg/dL — ABNORMAL HIGH (ref 70–99)
Glucose-Capillary: 74 mg/dL (ref 70–99)

## 2019-09-13 LAB — CBC
HCT: 30.6 % — ABNORMAL LOW (ref 39.0–52.0)
Hemoglobin: 9.1 g/dL — ABNORMAL LOW (ref 13.0–17.0)
MCH: 28.9 pg (ref 26.0–34.0)
MCHC: 29.7 g/dL — ABNORMAL LOW (ref 30.0–36.0)
MCV: 97.1 fL (ref 80.0–100.0)
Platelets: 234 10*3/uL (ref 150–400)
RBC: 3.15 MIL/uL — ABNORMAL LOW (ref 4.22–5.81)
RDW: 16.7 % — ABNORMAL HIGH (ref 11.5–15.5)
WBC: 7.7 10*3/uL (ref 4.0–10.5)
nRBC: 0 % (ref 0.0–0.2)

## 2019-09-13 LAB — BASIC METABOLIC PANEL
Anion gap: 10 (ref 5–15)
BUN: 15 mg/dL (ref 6–20)
CO2: 22 mmol/L (ref 22–32)
Calcium: 9 mg/dL (ref 8.9–10.3)
Chloride: 102 mmol/L (ref 98–111)
Creatinine, Ser: 1.21 mg/dL (ref 0.61–1.24)
GFR calc Af Amer: >60 mL/min (ref >60)
GFR calc non Af Amer: >60 mL/min (ref >60)
Glucose, Bld: 89 mg/dL (ref 70–99)
Potassium: 4.5 mmol/L (ref 3.5–5.1)
Sodium: 134 mmol/L — ABNORMAL LOW (ref 135–145)

## 2019-09-13 LAB — COOXEMETRY PANEL
Carboxyhemoglobin: 2.5 % — ABNORMAL HIGH (ref 0.5–1.5)
Methemoglobin: 0.9 % (ref 0.0–1.5)
O2 Saturation: 62.3 %
Total hemoglobin: 9.9 g/dL — ABNORMAL LOW (ref 12.0–16.0)

## 2019-09-13 LAB — PROTIME-INR
INR: 3.3 — ABNORMAL HIGH (ref 0.8–1.2)
Prothrombin Time: 32.3 seconds — ABNORMAL HIGH (ref 11.4–15.2)

## 2019-09-13 LAB — MAGNESIUM: Magnesium: 2 mg/dL (ref 1.7–2.4)

## 2019-09-13 LAB — LACTATE DEHYDROGENASE: LDH: 167 U/L (ref 98–192)

## 2019-09-13 MED ORDER — SENNOSIDES-DOCUSATE SODIUM 8.6-50 MG PO TABS: 1.0000 | ORAL_TABLET | Freq: Two times a day (BID) | ORAL | 6 refills | Status: DC

## 2019-09-13 MED ORDER — ZINC SULFATE 220 (50 ZN) MG PO CAPS: 220.0000 mg | ORAL_CAPSULE | Freq: Every day | ORAL | 6 refills | Status: DC

## 2019-09-13 MED ORDER — TRAMADOL HCL 50 MG PO TABS: 100.0000 mg | ORAL_TABLET | Freq: Four times a day (QID) | ORAL | 0 refills | Status: DC | PRN

## 2019-09-13 MED ORDER — DULOXETINE HCL 60 MG PO CPEP: 60.0000 mg | ORAL_CAPSULE | Freq: Every day | ORAL | 3 refills | Status: DC

## 2019-09-13 MED ORDER — SPIRONOLACTONE 25 MG PO TABS: 25.0000 mg | ORAL_TABLET | Freq: Every day | ORAL | 6 refills | Status: DC

## 2019-09-13 MED ORDER — CEFAZOLIN IV (FOR PTA / DISCHARGE USE ONLY): 2.0000 g | Freq: Three times a day (TID) | INTRAVENOUS | 0 refills | Status: DC

## 2019-09-13 MED ORDER — HYDROXYZINE HCL 25 MG PO TABS: 25.0000 mg | ORAL_TABLET | Freq: Every evening | ORAL | 0 refills | Status: DC | PRN

## 2019-09-13 MED ORDER — TORSEMIDE 20 MG PO TABS: 20.0000 mg | ORAL_TABLET | ORAL | 3 refills | Status: DC

## 2019-09-13 MED ORDER — OXYCODONE HCL 10 MG PO TABS: 10.0000 mg | ORAL_TABLET | Freq: Three times a day (TID) | ORAL | 0 refills | Status: DC

## 2019-09-13 MED ORDER — RIFABUTIN 150 MG PO CAPS: 300.0000 mg | ORAL_CAPSULE | Freq: Every day | ORAL | 1 refills | Status: DC

## 2019-09-13 MED ORDER — WARFARIN SODIUM 5 MG PO TABS: ORAL_TABLET | ORAL | 3 refills | Status: DC

## 2019-09-13 MED FILL — hydrOXYzine HCL 25 MG TABS: 25 | 30 days supply | Qty: 30 | Fill #0

## 2019-09-13 MED FILL — SPIRONOLACTONE 25 MG TABLET: 25 | 30 days supply | Qty: 30 | Fill #0

## 2019-09-13 MED FILL — ZINC SULFATE 220 MG TABLET: 220 (50 ZN) | 30 days supply | Qty: 30 | Fill #0

## 2019-09-13 MED FILL — SENEXON-S 8.6-50 MG TABS: 8.6-50 | 30 days supply | Qty: 60 | Fill #0

## 2019-09-13 MED FILL — TORSEMIDE 20 MG TABLET: 20 | 34 days supply | Qty: 45 | Fill #0

## 2019-09-13 MED FILL — DULoxetine HCL 60 MG CPEP: 60 | 30 days supply | Qty: 30 | Fill #0

## 2019-09-13 MED FILL — RIFABUTIN 150 MG CAPSULE: 150 | 30 days supply | Qty: 60 | Fill #0

## 2019-09-13 MED FILL — traMADol HCL 50 MG TABS: 50 | 5 days supply | Qty: 40 | Fill #0

## 2019-09-13 MED FILL — oxyCODONE HCL 10 MG TABS: 10 | 7 days supply | Qty: 21 | Fill #0

## 2019-09-13 NOTE — Discharge Instructions (Signed)
Information on my medicine - Coumadin   (Warfarin)  This medication education was reviewed with me or my healthcare representative as part of my discharge preparation.    Why was Coumadin prescribed for you? Coumadin was prescribed for you because you have a blood clot or a medical condition that can cause an increased risk of forming blood clots. Blood clots can cause serious health problems by blocking the flow of blood to the heart, lung, or brain. Coumadin can prevent harmful blood clots from forming. As a reminder your indication for Coumadin is:   Blood Clot Prevention After Heart Pump Surgery  What test will check on my response to Coumadin? While on Coumadin (warfarin) you will need to have an INR test regularly to ensure that your dose is keeping you in the desired range. The INR (international normalized ratio) number is calculated from the result of the laboratory test called prothrombin time (PT).  If an INR APPOINTMENT HAS NOT ALREADY BEEN MADE FOR YOU please schedule an appointment to have this lab work done by your health care provider within 7 days. Your INR goal is a number between:  2 to 2.5   Take Coumadin (warfarin) exactly as prescribed by your healthcare provider about the same time each day.  DO NOT stop taking without talking to the doctor who prescribed the medication.  Stopping without other blood clot prevention medication to take the place of Coumadin may increase your risk of developing a new clot or stroke.  Get refills before you run out.  What do you do if you miss a dose? If you miss a dose, take it as soon as you remember on the same day then continue your regularly scheduled regimen the next day.  Do not take two doses of Coumadin at the same time.  Important Safety Information A possible side effect of Coumadin (Warfarin) is an increased risk of bleeding. You should call your healthcare provider right away if you experience any of the following: ? Bleeding  from an injury or your nose that does not stop. ? Unusual colored urine (red or dark brown) or unusual colored stools (red or black). ? Unusual bruising for unknown reasons. ? A serious fall or if you hit your head (even if there is no bleeding).  Some foods or medicines interact with Coumadin (warfarin) and might alter your response to warfarin. To help avoid this: ? Eat a balanced diet, maintaining a consistent amount of Vitamin K. ? Notify your provider about major diet changes you plan to make. ? Avoid alcohol or limit your intake to 1 drink for women and 2 drinks for men per day. (1 drink is 5 oz. wine, 12 oz. beer, or 1.5 oz. liquor.)  Make sure that ANY health care provider who prescribes medication for you knows that you are taking Coumadin (warfarin).  Also make sure the healthcare provider who is monitoring your Coumadin knows when you have started a new medication including herbals and non-prescription products.  Coumadin (Warfarin)  Major Drug Interactions  Increased Warfarin Effect Decreased Warfarin Effect  Alcohol (large quantities) Antibiotics (esp. Septra/Bactrim, Flagyl, Cipro) Amiodarone (Cordarone) Aspirin (ASA) Cimetidine (Tagamet) Megestrol (Megace) NSAIDs (ibuprofen, naproxen, etc.) Piroxicam (Feldene) Propafenone (Rythmol SR) Propranolol (Inderal) Isoniazid (INH) Posaconazole (Noxafil) Barbiturates (Phenobarbital) Carbamazepine (Tegretol) Chlordiazepoxide (Librium) Cholestyramine (Questran) Griseofulvin Oral Contraceptives Rifampin Sucralfate (Carafate) Vitamin K   Coumadin (Warfarin) Major Herbal Interactions  Increased Warfarin Effect Decreased Warfarin Effect  Garlic Ginseng Ginkgo biloba Coenzyme Q10 Green tea St.  John's wort    Coumadin (Warfarin) FOOD Interactions  Eat a consistent number of servings per week of foods HIGH in Vitamin K (1 serving =  cup)  Collards (cooked, or boiled & drained) Kale (cooked, or boiled &  drained) Mustard greens (cooked, or boiled & drained) Parsley *serving size only =  cup Spinach (cooked, or boiled & drained) Swiss chard (cooked, or boiled & drained) Turnip greens (cooked, or boiled & drained)  Eat a consistent number of servings per week of foods MEDIUM-HIGH in Vitamin K (1 serving = 1 cup)  Asparagus (cooked, or boiled & drained) Broccoli (cooked, boiled & drained, or raw & chopped) Brussel sprouts (cooked, or boiled & drained) *serving size only =  cup Lettuce, raw (green leaf, endive, romaine) Spinach, raw Turnip greens, raw & chopped   These websites have more information on Coumadin (warfarin):  http://www.king-russell.com/; https://www.hines.net/;

## 2019-09-13 NOTE — Progress Notes (Signed)
Patient ID: Jonathan Wood, male   DOB: [birth year], 49 y.o.   MRN: [id number]     Advanced Heart Failure Rounding Note  PCP-Cardiologist: Arvilla Meres, MD    Patient Profile   49 y.o. 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  Subjective:    - 4/5 S/p I&D w/ excisional debridement of subxiphoid abscess. Cultures + Staph Aureus  - 4/7, Returned to OR for debridement, Irrigation and Packing of Abdominal Incision - 4/9 S/P I&D abd wound with Acell and VAC application.  - 4/15 s/p I&D wound with VAC application.  - 4/19 S/P I&D with VAC application.  - 4/21 S/P reconstructive surgery with pec flap. Received steroids in the OR.   Now on cefepime and rifabutin.   Wound dressing removed at bedside yesterday. Says he wants to go home today. Feels ok. No fevers or chills. Denies orthopnea or PND.   HM3 VAD Interrogation: Speed:5600 Flow:5.0 Power:4.0  PI: 3.7   VAD interrogated personally. Parameters stable.   Objective:   Weight Range: 95.2 kg Body mass index is 26.95 kg/m.   Vital Signs:   Temp:  [98 F (36.7 C)-98.4 F (36.9 C)] 98.4 F (36.9 C) (04/29 1051) Pulse Rate:  [73-114] 73 (04/29 1051) Resp:  [17-23] 19 (04/29 1214) BP: (88-101)/(67-85) 88/76 (04/29 1214) SpO2:  [92 %-100 %] 100 % (04/29 1214) Weight:  [95.2 kg] 95.2 kg (04/29 0433) Last BM Date: 09/10/19  Weight change: Filed Weights   09/11/19 0500 09/12/19 0344 09/13/19 0433  Weight: 96.8 kg 95.5 kg 95.2 kg    Intake/Output:   Intake/Output Summary (Last 24 hours) at 09/13/2019 1304 Last data filed at 09/13/2019 0713 Gross per 24 hour  Intake 100 ml  Output 615 ml  Net -515 ml    Telemetry: SR 70-80s Personally reviewed  Physical Exam   General:  NAD.  HEENT: normal  Neck: supple. JVP to jaw Carotids 2+ bilat; no bruits. No  lymphadenopathy or thryomegaly appreciated. Cor: LVAD hum.  Wound site dressed.  JP drains in place.  Lungs: Clear. Abdomen: obese soft, nontender, non-distended. No hepatosplenomegaly. No bruits or masses. Good bowel sounds. Driveline site clean. Anchor in place.  Extremities: no cyanosis, clubbing, rash. Warm no edema  Neuro: alert & oriented x 3. No focal deficits. Moves all 4 without problem    Labs    CBC Recent Labs    09/12/19 0406 09/13/19 0507  WBC 8.0 7.7  HGB 8.8* 9.1*  HCT 29.8* 30.6*  MCV 97.1 97.1  PLT 216 234   Basic Metabolic Panel Recent Labs    67/12/45 0406 09/13/19 0507  NA 131* 134*  K 4.4 4.5  CL 100 102  CO2 16* 22  GLUCOSE 82 89  BUN 17 15  CREATININE 1.20 1.21  CALCIUM 8.9 9.0  MG 1.9 2.0   Liver Function Tests No results for input(s): AST, ALT, ALKPHOS, BILITOT, PROT, ALBUMIN in the last 72 hours. No results for input(s): LIPASE, AMYLASE in the last 72 hours. Cardiac Enzymes No results for input(s): CKTOTAL, CKMB, CKMBINDEX, TROPONINI in the last 72 hours.  BNP: BNP (last 3 results) Recent Labs    03/21/19 1415 04/23/19 1350  BNP 426.9* 709.6*    ProBNP (last 3 results) No results for input(s): PROBNP in the last 8760 hours.   D-Dimer No results for input(s): DDIMER  in the last 72 hours. Hemoglobin A1C No results for input(s): HGBA1C in the last 72 hours. Fasting Lipid Panel No results for input(s): CHOL, HDL, LDLCALC, TRIG, CHOLHDL, LDLDIRECT in the last 72 hours. Thyroid Function Tests No results for input(s): TSH, T4TOTAL, T3FREE, THYROIDAB in the last 72 hours.  Invalid input(s): FREET3  Other results:   Imaging    No results found.   Medications:     Scheduled Medications: . amiodarone  200 mg Oral BID  . vitamin C  250 mg Oral Daily  . bisacodyl  10 mg Oral Daily  . Chlorhexidine Gluconate Cloth  6 each Topical Daily  . docusate sodium  100 mg Oral TID  . DULoxetine  60 mg Oral Daily  . feeding  supplement (ENSURE ENLIVE)  237 mL Oral TID BM  . ferrous sulfate  325 mg Oral Q breakfast  . gabapentin  300 mg Oral TID  . hydrALAZINE  75 mg Oral TID  . insulin aspart  0-24 Units Subcutaneous Q4H  . linaclotide  145 mcg Oral Daily  . mouth rinse  15 mL Mouth Rinse BID  . multivitamin with minerals  1 tablet Oral Daily  . oxyCODONE  10 mg Oral Q8H  . pantoprazole  40 mg Oral BID  . rifabutin  300 mg Oral Daily  . senna-docusate  1 tablet Oral BID  . sildenafil  20 mg Oral TID  . sodium chloride flush  10-40 mL Intracatheter Q12H  . spironolactone  25 mg Oral Daily  . Warfarin - Pharmacist Dosing Inpatient   Does not apply q1600  . zinc sulfate  220 mg Oral Daily    Infusions: . sodium chloride Stopped (09/06/19 1456)  . sodium chloride 10 mL/hr at 09/07/19 0721  . ceFEPime (MAXIPIME) IV 2 g (09/13/19 0508)  . dextrose 5 % and 0.45% NaCl Stopped (08/25/19 1414)  . lactated ringers Stopped (09/05/19 1651)    PRN Medications: sodium chloride, sodium chloride, acetaminophen, albuterol, alum & mag hydroxide-simeth, bisacodyl **OR** bisacodyl, guaiFENesin-dextromethorphan, hydrOXYzine, magnesium hydroxide, ondansetron (ZOFRAN) IV, sodium chloride flush, traMADol    Assessment/Plan    1. Fever & leukocytosis -> MSSA bacteremia/ Subxiphoid Abscess  - bcx 2/2 MSSA - ID has changed antibiotics to cefazolin, now has PICC.   - WBC trending down - now 7.7. Afebrile - He is not a candidate for VAD exchange due to severe RV failure and noncompliance.  - S/p I&D on 4/5. Wound cultures +MSSA.  - Back to OR 4/7, 4/9, 4/15 , 4/19 debridement and irrigation of driveline + packing, wound vac in place.    -S/P reconstructive surgery with pec flap 4/21 -VAC removed 4/27. Dressing taken down 4/28 - Afebrile. WBC 7.7 - ID recommends 6 weeks of IV abx followed by long-term suppressive oral cephalexin.  - Now on cefepime and rifabutin.  - Plan to switch to cefazolin prior to d/c.   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  S/p HM-3 LVAD on 09/06/17 - maps stable.  - On sildenafil for RV failure.  - CO-OX stable at 62% today - Torsemide has been on hold due to AKI. Can resume 20 qod  - Renal function improved. Creatinine 1.2 - Continue current dose of hydralazine and sildenafil. No imdur with sildenafil.   3. HM3 LVAD 09/06/2017. - VAD interrogated personally. Parameters stable. - LDH 167 - INR 3.3  - Warfarin dosing d/w PharmD persoanlly - Off ASA with  PUD.    4. Essential HTN - MAPs ok  - Continue hydralazine 75 mg tid. - Continue sildenafil 20 mg tid     5. AKI - Creatinine baseline 0.9-1.1  - Diuretics on hold for AKI - SCr Stable today at 1.2  6. OSA - sleep study with very severe OSA (AHI 69/hr) - non-compliant Bipap in the past, has refused to use.  - He continues to tell respiratory that he will wear the machine himself.   - He is only using intermittently. Has been refusing at night despite our encouragement  7. H/o GI bleed  - 08/2018 EGD completed and showed gastritis and nonbleeding duodenal ulcers, colonoscopy with 5 polyps removed. Protonix was increased to BID.  - No recent GI bleeding - hgb 8.8- >9.1. Stable.  - Transfuse hgb < 7.  - He will stay off aspirin for now.   8. Tobacco Abuse  - says he quit recently  9. Severe protein calorie malnutrition  - Albumin 2.1 on 4/14 - nutrition seeing   10 Hyperkalemia/hypokalemia - potassium stable.   12. Hypomagnesium - improved w/ supplementation, 2.0 today  Discussed with VAD coordinator- Plan for his mom to return today for wound care education and home antibiotic education. Looks like she will just need to empty JP drains. Possibly home later today with close f/u in VAD and ID clinics.   Length of Stay: 62  Arvilla Meres, MD  09/13/2019, 1:04 PM  Advanced Heart Failure Team Pager [phone] (M-F; 7a - 4p)  Please contact [facility]  for night-coverage after hours (4p -7a ) and weekends on [email or web address] 1:04 PM

## 2019-09-13 NOTE — Discharge Summary (Addendum)
Advanced Heart Failure Team  Discharge Summary   Patient ID: Jonathan Wood MRN: [id number], DOB/AGE: 1970-12-24 49 y.o. Admit date: 08/16/2019 D/C date:     09/13/2019   Primary Discharge Diagnoses:  1. 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 Acute on chronic systolic HF with RV failure 3. HM3 LVAD 09/06/2017.  4. Essential HTN 5. AKI 6. OSA  7. H/o GI bleed  8. Tobacco Abuse  9. Severe protein calorie malnutrition  10 Hyperkalemia/hypokalemia 11. Hypomagnesium    Hospital Course:  Jonathan Wood is a 49 y.o. 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  GI Issues 08/2018- EGD completed and showed gastritis and nonbleeding duodenal ulcers, colonoscopy with 5 polyps removed. Protonix increased to BID, he was started on PO iron, and hydrocortisone suppositories for hemorrhoid treatment.   LVAD Driveline Infection  04/2019- Treated with IV antibiotics  08/16/2019- Treated with IV antibiotics   Admitted 10/17/6946 with LVAD complication/driveline infection. CT surgery and Plastic surgery consulted. He had multiple trips to the OR for I&D and wound care. On 4/21 he underwent reconstructive surgery with pectoralis flap.   ID consulted  - Plan to continue cefazolin +rifabutin for total of 6 weeks. Stop date set for 10/14/19.  He is being discharged with a PICC. Jonathan Wood and Jonathan Wood received education home antibiotics + wound/JP.   He will continue to be follow to be followed closely in the VAD clinic/CT surgery & ID clinic. Marland Kitchen He also has follow up with Dr Marla Roe.   1. Fever & leukocytosis -> MSSA bacteremia/ Subxiphoid Abscess  - bcx 2/2 MSSA - ID consulted.    -- He is not a candidate for VAD exchange due to severe RV failure and noncompliance.  - S/P I&D on 4/5. Wound cultures +MSSA.  - Back to OR 4/7, 4/9, 4/15 , 4/19 debridement  and irrigation of driveline + packing, wound vac in place.    -S/P reconstructive surgery with pec flap 4/21 -VAC removed 4/27  - ID recommends 6 weeks of IV abx followed by long-term suppressive  - While hospitalized he was on cefepime and rifabutin.  - Plan to complete rifabutin on 10/14/19/  -Switched to cefazolin at d/c will complete on 10/14/19.  2. Acute on chronic systolic HF with RV failure - EF 10% also has severe RV failure.  S/p HM-3 LVAD on 09/06/17 - On sildenafil for RV failure.  - CO-OX stable.  - Diuresed as needed. Continue torsemide every other day.  - Continue current dose of hydralazine and sildenafil. No imdur with sildenafil.   3. HM3 LVAD 09/06/2017.  - VAD interrogated personally. Parameters stable. - LDH and INR followed closely.  - Continue warfarin. Discussed dosing with PharmD personally. - Off ASA with PUD.    4. Essential HTN -Maps followed closely  - Continue hydralazine 50 mg tid. - Continue sildenafil 20 mg tid     5. AKI - Creatinine baseline 0.9-1.1  - Creatinine peaked at 1.8  - Creatinine down to 1.3 today.   6. OSA - sleep study with very severe OSA (AHI 69/hr) - non-compliant Bipap in the past, has refused to use.  - He continues to tell respiratory that he will wear the machine himself.   - He is only using intermittently.  7. H/o GI bleed  - 08/2018 EGD completed and showed gastritis and nonbleeding duodenal  ulcers, colonoscopy with 5 polyps removed. Protonix was increased to BID.  - No recent GI bleeding - hgb 8.8 . Stable.  - Transfuse hgb < 7.  - He will stay off aspirin for now.   8. Tobacco Abuse  - says he quit recently  63. Severe protein calorie malnutrition  - Albumin 2.1 on 4/14 - nutrition consulted.   10 Hyperkalemia/hypokalemia - potassium stable.   12. Hypomagnesium - Mag   LVAD Interrogation HM II:   Speed: 5650    Flow:  5    PI:  4.2   Power:   4    Back-up speed:  5600      Discharge Vitals: Blood pressure (!)  88/76, pulse 73, temperature 98.4 F (36.9 C), temperature source Oral, resp. rate 19, height _0  (1.88 m), weight 95.2 kg, SpO2 100 %.  Labs: Lab Results  Component Value Date   WBC 7.7 09/13/2019   HGB 9.1 (L) 09/13/2019   HCT 30.6 (L) 09/13/2019   MCV 97.1 09/13/2019   PLT 234 09/13/2019    Recent Labs  Lab 09/07/19 0207 09/08/19 0442 09/13/19 0507  NA 131*   < > 134*  K 4.2   < > 4.5  CL 93*   < > 102  CO2 27   < > 22  BUN 33*   < > 15  CREATININE 1.30*   < > 1.21  CALCIUM 9.2   < > 9.0  PROT 7.4  --   --   BILITOT 1.2  --   --   ALKPHOS 105  --   --   ALT 7  --   --   AST 21  --   --   GLUCOSE 109*   < > 89   < > = values in this interval not displayed.   Lab Results  Component Value Date   CHOL 111 08/30/2017   HDL 33 (L) 08/30/2017   LDLCALC 70 08/30/2017   TRIG 38 08/30/2017   BNP (last 3 results) Recent Labs    03/21/19 1415 04/23/19 1350  BNP 426.9* 709.6*    ProBNP (last 3 results) No results for input(s): PROBNP in the last 8760 hours.   Diagnostic Studies/Procedures   No results found.  Discharge Medications   Allergies as of 09/13/2019   No Known Allergies      Medication List     STOP taking these medications    citalopram 10 MG tablet Commonly known as: CELEXA   potassium chloride SA 20 MEQ tablet Commonly known as: Klor-Con M20       TAKE these medications    ceFAZolin  IVPB Commonly known as: ANCEF Inject 2 g into the vein every 8 (eight) hours. Indication: Bacteremia and drive-line infection Last Day of Therapy:  10/14/19 Labs - Once weekly:  CBC/D and CMP, Labs - Every other week:  ESR and CRP   docusate sodium 100 MG capsule Commonly known as: Colace Take 1 capsule (100 mg total) by mouth 3 (three) times daily.   DULoxetine 60 MG capsule Commonly known as: CYMBALTA Take 1 capsule (60 mg total) by mouth daily. Start taking on: September 14, 2019   gabapentin 300 MG capsule Commonly known as: NEURONTIN TAKE  ONE CAPSULE BY MOUTH THREE TIMES DAILY   hydrALAZINE 50 MG tablet Commonly known as: APRESOLINE Take 1 tablet (50 mg total) by mouth 3 (three) times daily.   hydrOXYzine 25 MG tablet Commonly known as: ATARAX/VISTARIL Take 1  tablet (25 mg total) by mouth at bedtime as needed for anxiety. What changed: when to take this   linaclotide 145 MCG Caps capsule Commonly known as: Linzess Take 1 capsule (145 mcg total) by mouth daily before breakfast. What changed: when to take this   Oxycodone HCl 10 MG Tabs Take 1 tablet (10 mg total) by mouth every 8 (eight) hours.   pantoprazole 40 MG tablet Commonly known as: PROTONIX Take 1 tablet (40 mg total) by mouth 2 (two) times daily.   rifabutin 150 MG capsule Commonly known as: MYCOBUTIN Take 2 capsules (300 mg total) by mouth daily. Start taking on: September 14, 2019   senna-docusate 8.6-50 MG tablet Commonly known as: Senokot-S Take 1 tablet by mouth 2 (two) times daily.   sildenafil 20 MG tablet Commonly known as: REVATIO Take 1 tablet (20 mg total) by mouth 3 (three) times daily.   spironolactone 25 MG tablet Commonly known as: ALDACTONE Take 1 tablet (25 mg total) by mouth daily. Start taking on: September 14, 2019   torsemide 20 MG tablet Commonly known as: DEMADEX Take 1 tablet (20 mg total) by mouth every other day. May take extra if needed Start taking on: September 14, 2019   traMADol 50 MG tablet Commonly known as: ULTRAM Take 2 tablets (100 mg total) by mouth every 6 (six) hours as needed for moderate pain.   warfarin 5 MG tablet Commonly known as: COUMADIN Take as directed. If you are unsure how to take this medication, talk to your nurse or doctor. Original instructions: 2.5 mg daily What changed: additional instructions   zinc sulfate 220 (50 Zn) MG capsule Take 1 capsule (220 mg total) by mouth daily. Start taking on: September 14, 2019               Home Infusion Instuctions  (From admission, onward)            Start     Ordered   09/13/19 0000  Home infusion instructions    Question:  Instructions  Answer:  Flushing of vascular access device: 0.9% NaCl pre/post medication administration and prn patency; Heparin 100 u/ml, 22m for implanted ports and Heparin 10u/ml, 542mfor all other central venous catheters.   09/13/19 1337              Discharge Care Instructions  (From admission, onward)           Start     Ordered   09/13/19 0000  Discharge wound care:    Comments: Keep Mepilex Border on Sternal wound. Empty JP drain daily. Record drainage   09/13/19 1337            Disposition   The patient will be discharged in stable condition to home. Discharge Instructions     (HEART FAILURE PATIENTS) Call MD:  Anytime you have any of the following symptoms: 1) 3 pound weight gain in 24 hours or 5 pounds in 1 week 2) shortness of breath, with or without a dry hacking cough 3) swelling in the hands, feet or stomach 4) if you have to sleep on extra pillows at night in order to breathe.   Complete by: As directed    Diet - low sodium heart healthy   Complete by: As directed    Discharge wound care:   Complete by: As directed    Keep Mepilex Border on Sternal wound. Empty JP drain daily. Record drainage   Home infusion instructions   Complete by:  As directed    Instructions: Flushing of vascular access device: 0.9% NaCl pre/post medication administration and prn patency; Heparin 100 u/ml, 78m for implanted ports and Heparin 10u/ml, 561mfor all other central venous catheters.   INR  Goal: 2 - 2.5   Complete by: As directed    Goal: 2 - 2.5   Increase activity slowly   Complete by: As directed    Page VAD Coordinator at [phone] for: any VAD alarms, sustained elevations of power >10 watts, sustained drop in Pulse Index <3   Complete by: As directed    Notify for:  any VAD alarms sustained elevations of power >10 watts sustained drop in Pulse Index <3      Speed Settings:   Complete by: As directed    Fixed 5600 RPM Low 5300 RPM      Follow-up [facility] on 09/24/2024.   Why: You have been schedule for a followup appointment on Sep 25, 2019 at 2:30 pm for a followup appointment with a primary care physician. Contact information: [address] [place] [phone]      Follow up In 1 week.          Dillingham, ClLoel LoftyDO.   Specialty: Plastic Surgery Contact information: [phone]       [facility] Follow up.   Specialty: Cardiology Contact information: [phone]           Duration of Discharge Encounter: Greater than 35 minutes   Signed, AmDarrick GrinderP-C  09/13/2019, 2:53 PM   Agree with above. Stable for d/c today with close f/u in VAD clinic and wound care.   DaGlori BickersMD  12:27 PM

## 2019-09-13 NOTE — Progress Notes (Signed)
LVAD Coordinator Rounding Note:  Admitted 08/16/19 by Dr. [name] due to VAD drive line infection/pneumonia/sepsis  HM III LVAD implanted on 09/06/17 by Dr. Laneta Simmers under Destination Therapy criteria.  Pt laying in bed resting. Denies pain currently.   Per pt his mom is coming at 1 for training for IV antibiotics. Mepitel dressings at bedside for Dr. Ulice Bold to use for sternal dressing today.  Vital signs: Temp: 98.4 HR: 99 Auto BP: 95/80 (86) Doppler: 84 Sat: 98% on RA Wt: 227.5>231>235>233.9>236.3.Marland Kitchen..213>214.9>218.9>216.4>211.6>213.4>210.5>209.8 lbs   LVAD interrogation reveals:  Speed: 5600 Flow: 5 Power:  4.2w PI: 3.5 Alarms: none Events: none Hematocrit: 27  Fixed speed: 5600  Low speed limit: 5300  Drive Line:  Left abdominal drive line dressing C/D/I with anchor unattached. Anchor replaced. Weekly dressing changes per BS nurse. Dressing change due 09/18/19.  Sternal wound: Existing dressing removed using sterile technique. Replaced with 2 mepitel dressings. These should remain in place until pt is seen in clinic next week unless saturated.   Blood products: - 09/04/19>>1 PCs - 09/05/19>>2 FFP  Labs:  LDH trend: 227>230>271........892>119>417>408>144>818>563>149>702>637>858>850>277  INR trend: 4.5>5.0>1.7>1.7>1.7>1.8>1.7>2.1>3.4>2.5>2.7>2.1>1.4>1.5>1.4>1.5>1.4>1.8>2.2>2.9>3.6>3.3    WBC: 27.3>26.2>24>34.6>25.8>20.8......13.0>12.4>14.8>19.3>11.9>13.0>9.5>8.0>7.7  Infection: - blood cultures 08/16/19>>staphylococcus aureus - blood cultures 08/18/19>> NGTD - Drive line tunnel / sternum 08/20/19>> abundant WBC, gram + cocci, culture- few staph aureus - Drive line AFB 4/1/287>> pending - Drive line/Sternal Fungus culture 08/20/19>> negative - blood cultures 09/02/19>>negative/final - body fluid culture 08/28/19>> negative/final - urine culture 09/04/19>>negative/final - blood cultures 09/04/19>> negative/ final  Anticoagulation Plan: -INR Goal: 2.0 - 2.5  -ASA Dose:  none  Adverse Events: - driveline cx 04/23/19>> staph aureus - Admitted for sternal/drive line infection. OR for debridement 08/20/19- + staph aureus wound cultures; further irrigation/debridements done 08/22/19, 08/24/19, 08/30/19, and 09/03/19. Debridement and closure of sternal wound with muscle flap 09/05/19.     Plan/Recommendations:  1. Call VAD pager if any VAD equipment or drive line issues. 2. Weekly drive line dressing changes by bedside nurse.  3. Pt ok to d/c home. Please educate pt on JP drain care and emptying. Send pt home w/ 2 extra mepitel dressings for sternum in case of saturation.    Carlton Adam RN VAD Coordinator  Office: [phone]  24/7 Pager: [phone]

## 2019-09-13 NOTE — Plan of Care (Signed)
  Problem: Education: Goal: Knowledge of General Education information will improve Description: Including pain rating scale, medication(s)/side effects and non-pharmacologic comfort measures Outcome: Progressing   Problem: Safety: Goal: Ability to remain free from injury will improve Outcome: Progressing   Problem: Cardiac: Goal: LVAD will function as expected and patient will experience no clinical alarms Outcome: Progressing   Problem: Education: Goal: Patient will understand all VAD equipment and how it functions Outcome: Progressing   Problem: Cardiac: Goal: LVAD will function as expected and patient will experience no clinical alarms Outcome: Progressing   Problem: Education: Goal: Knowledge of the prescribed therapeutic regimen will improve Outcome: Progressing   Problem: Clinical Measurements: Goal: Postoperative complications will be avoided or minimized Outcome: Progressing

## 2019-09-13 NOTE — Progress Notes (Addendum)
During dressing change on PICC line was pulled out approximately 1/2 inch. RN contacted IV team for consult. RN advise to continue with dressing change and request CXR to verify placement. RN placed stat request for PICC line placement verification.  RN received call from [facility], interventional radiology, advised PICC line placement look fine. Okay for use. NO other interventions needed. RN will continue with patient discharge.

## 2019-09-13 NOTE — TOC Transition Note (Signed)
Transition of Care [facility]) - CM/SW Discharge Note   Patient Details  Name: Jonathan Wood MRN: [id number] Date of Birth: [birth year]  Transition of Care [facility]) CM/SW Contact:  Leone Haven, RN Phone Number: 09/13/2019, 2:40 PM   Clinical Narrative:    Patient is for dc today, home, [name] has done iv abx teaching with patient's mom and staff RN has showed her how to changed the dressing.  Patient no longer needs wound vac.  NCM contacted French Ana with [employer] , she will be here in am to pick up wound vac.  Per [name] mom will do the iv abx today and tomorrow and [name] will start on Saturday morning.     Final next level of care: Home w Home Health Services Barriers to Discharge: No Barriers Identified   Patient Goals and CMS Choice   CMS Medicare.gov Compare Post Acute Care list provided to:: Patient Choice offered to / list presented to : Patient  Discharge Placement                       Discharge Plan and Services                  DME Agency: NA       HH Arranged: RN HH Agency: [name]) Date [facility] Agency Contacted: 09/11/19 Time HH Agency Contacted: 1440 Representative spoke with at [employer] Agency: [name]  Social Determinants of Health (SDOH) Interventions     Readmission Risk Interventions No flowsheet data found.

## 2019-09-13 NOTE — Progress Notes (Signed)
PICC line placement: Chest Xray reviewed by Luster Landsberg, PICC RN: OK to use. [place], RN made aware.

## 2019-09-13 NOTE — Progress Notes (Signed)
ANTICOAGULATION CONSULT NOTE - Follow Up Consult  Pharmacy Consult for warfarin Indication: LVAD  No Known Allergies  Patient Measurements: Height: 6\' 2"  (188 cm) Weight: 95.2 kg (209 lb 14.1 oz) IBW/kg (Calculated) : 82.2  Vital Signs: Temp: 98.4 F (36.9 C) (04/29 1051) Temp Source: Oral (04/29 1051) BP: 88/76 (04/29 1214) Pulse Rate: 73 (04/29 1051)  Labs: Recent Labs    09/11/19 0430 09/11/19 0430 09/12/19 0406 09/12/19 0450 09/13/19 0507  HGB 9.2*   < > 8.8*  --  9.1*  HCT 30.5*  --  29.8*  --  30.6*  PLT 225  --  216  --  234  LABPROT 30.0*  --   --  34.6* 32.3*  INR 2.9*  --   --  3.6* 3.3*  CREATININE 1.43*  --  1.20  --  1.21   < > = values in this interval not displayed.    Estimated Creatinine Clearance: 88.7 mL/min (by C-G formula based on SCr of 1.21 mg/dL).   Medical History: Past Medical History:  Diagnosis Date  . Asthma   . CHF (congestive heart failure) (HCC)    a. 09/2016: EF 20-25% with cath showing normal cors  . GERD (gastroesophageal reflux disease)   . History of hiatal hernia   . LVAD (left ventricular assist device) present (HCC)   . OSA on CPAP 09/06/2018   Severe OSA with AHI 68/hr on CPAP at 12cm H2O    Assessment: 48yom with HF s/p LVAD implant HM3 place 4/19 admitted with drive line infection. S/p OR on 4/15 for repeat I&D with application of wound vac.   Warfarin PTA 7.5mg  MW and 5mg  all other days admit INR elevated at 5 - probably acute infection related supra-sensitive.   Warfarin has been on hold since 4/19. Heparin was started on 4/18 with INR <2 and with plans to return to OR on 4/21. Both heparin and warfarin resumed post-OR on 4/22 after discussing with Dr. 5/21. Heparin was stopped on 4/23.  INR today is 3.4, above goal range. Hgb stable, plt stable. LDH stable. No bleeding noted.   Goal of Therapy:  INR 2-2.5 Monitor platelets by anticoagulation protocol: Yes   Plan: -warfarin 2.5mg  daily - dc  home folllow up outpt for INR  Donata Clay Pharm.D. CPP, BCPS Clinical Pharmacist [phone] 09/13/2019 2:09 PM     Please check [email or web address] for unit-specific pharmacist phone numbers

## 2019-09-13 NOTE — Progress Notes (Addendum)
RN provided patient with detailed verbal discharge instructions. Patients mother at bedside during discharge. PICC dressing changed. PICC line and JP drains left in place per order.  RN educated patient and mother on how to empty chest JP drain. Extra supplies sent with patient for sternal dressing change and JP drain. IV antibiotic teaching done by [name] at bedside with patient and mother.  TOC pharmacy brought medications to bedside. Pt black emergency bag sent with pt.  RN answered all questions. Pt belongings sent with patient. Pt discharge via wheelchair through [employer] entrance to private vehicle.

## 2019-09-17 ENCOUNTER — Other Ambulatory Visit (HOSPITAL_COMMUNITY): Payer: Self-pay | Admitting: *Deleted

## 2019-09-17 DIAGNOSIS — Z7901 Long term (current) use of anticoagulants: Secondary | ICD-10-CM

## 2019-09-17 DIAGNOSIS — T827XXA Infection and inflammatory reaction due to other cardiac and vascular devices, implants and grafts, initial encounter: Secondary | ICD-10-CM

## 2019-09-17 DIAGNOSIS — Z95811 Presence of heart assist device: Secondary | ICD-10-CM

## 2019-09-18 ENCOUNTER — Other Ambulatory Visit (HOSPITAL_COMMUNITY): Payer: Self-pay | Admitting: *Deleted

## 2019-09-18 ENCOUNTER — Ambulatory Visit (HOSPITAL_COMMUNITY): Payer: Self-pay | Admitting: Pharmacist

## 2019-09-18 ENCOUNTER — Encounter (HOSPITAL_COMMUNITY): Payer: Self-pay

## 2019-09-18 ENCOUNTER — Other Ambulatory Visit: Payer: Self-pay

## 2019-09-18 ENCOUNTER — Ambulatory Visit (INDEPENDENT_AMBULATORY_CARE_PROVIDER_SITE_OTHER): Payer: Medicaid Other | Admitting: Surgical

## 2019-09-18 ENCOUNTER — Encounter: Payer: Self-pay | Admitting: Surgical

## 2019-09-18 ENCOUNTER — Ambulatory Visit (HOSPITAL_COMMUNITY)
Admit: 2019-09-18 | Discharge: 2019-09-18 | Disposition: A | Discharge status: Home / Self Care | Payer: Medicare Other | Source: Ambulatory Visit | Attending: Cardiology | Admitting: Cardiology

## 2019-09-18 VITALS — BP 113/61 | HR 90 | Temp 98.5°F | Wt 218.4 lb

## 2019-09-18 VITALS — BP 139/88 | HR 92 | Temp 98.4°F | Ht 74.0 in | Wt 232.0 lb

## 2019-09-18 DIAGNOSIS — Z95811 Presence of heart assist device: Secondary | ICD-10-CM

## 2019-09-18 DIAGNOSIS — G4733 Obstructive sleep apnea (adult) (pediatric): Secondary | ICD-10-CM | POA: Insufficient documentation

## 2019-09-18 DIAGNOSIS — D649 Anemia, unspecified: Secondary | ICD-10-CM | POA: Diagnosis not present

## 2019-09-18 DIAGNOSIS — K219 Gastro-esophageal reflux disease without esophagitis: Secondary | ICD-10-CM | POA: Insufficient documentation

## 2019-09-18 DIAGNOSIS — Z7901 Long term (current) use of anticoagulants: Secondary | ICD-10-CM | POA: Diagnosis not present

## 2019-09-18 DIAGNOSIS — Z79899 Other long term (current) drug therapy: Secondary | ICD-10-CM | POA: Insufficient documentation

## 2019-09-18 DIAGNOSIS — I5022 Chronic systolic (congestive) heart failure: Secondary | ICD-10-CM | POA: Diagnosis present

## 2019-09-18 DIAGNOSIS — T827XXA Infection and inflammatory reaction due to other cardiac and vascular devices, implants and grafts, initial encounter: Secondary | ICD-10-CM

## 2019-09-18 DIAGNOSIS — F1010 Alcohol abuse, uncomplicated: Secondary | ICD-10-CM

## 2019-09-18 DIAGNOSIS — R630 Anorexia: Secondary | ICD-10-CM | POA: Diagnosis not present

## 2019-09-18 DIAGNOSIS — Z8249 Family history of ischemic heart disease and other diseases of the circulatory system: Secondary | ICD-10-CM | POA: Insufficient documentation

## 2019-09-18 DIAGNOSIS — I110 Hypertensive heart disease with heart failure: Secondary | ICD-10-CM | POA: Diagnosis not present

## 2019-09-18 DIAGNOSIS — Z720 Tobacco use: Secondary | ICD-10-CM

## 2019-09-18 DIAGNOSIS — Z87891 Personal history of nicotine dependence: Secondary | ICD-10-CM | POA: Diagnosis not present

## 2019-09-18 LAB — COMPREHENSIVE METABOLIC PANEL
ALT: 7 U/L (ref 0–44)
AST: 21 U/L (ref 15–41)
Albumin: 2.7 g/dL — ABNORMAL LOW (ref 3.5–5.0)
Alkaline Phosphatase: 103 U/L (ref 38–126)
Anion gap: 12 (ref 5–15)
BUN: 5 mg/dL — ABNORMAL LOW (ref 6–20)
CO2: 20 mmol/L — ABNORMAL LOW (ref 22–32)
Calcium: 8.7 mg/dL — ABNORMAL LOW (ref 8.9–10.3)
Chloride: 104 mmol/L (ref 98–111)
Creatinine, Ser: 1.03 mg/dL (ref 0.61–1.24)
GFR calc Af Amer: >60 mL/min (ref >60)
GFR calc non Af Amer: >60 mL/min (ref >60)
Glucose, Bld: 82 mg/dL (ref 70–99)
Potassium: 4 mmol/L (ref 3.5–5.1)
Sodium: 136 mmol/L (ref 135–145)
Total Bilirubin: 1.2 mg/dL (ref 0.3–1.2)
Total Protein: 7.3 g/dL (ref 6.5–8.1)

## 2019-09-18 LAB — CBC WITH DIFFERENTIAL/PLATELET
Abs Immature Granulocytes: 0.04 10*3/uL (ref 0.00–0.07)
Basophils Absolute: 0.1 10*3/uL (ref 0.0–0.1)
Basophils Relative: 1 %
Eosinophils Absolute: 0.1 10*3/uL (ref 0.0–0.5)
Eosinophils Relative: 2 %
HCT: 29.4 % — ABNORMAL LOW (ref 39.0–52.0)
Hemoglobin: 8.8 g/dL — ABNORMAL LOW (ref 13.0–17.0)
Immature Granulocytes: 1 %
Lymphocytes Relative: 17 %
Lymphs Abs: 1 10*3/uL (ref 0.7–4.0)
MCH: 28.7 pg (ref 26.0–34.0)
MCHC: 29.9 g/dL — ABNORMAL LOW (ref 30.0–36.0)
MCV: 95.8 fL (ref 80.0–100.0)
Monocytes Absolute: 0.8 10*3/uL (ref 0.1–1.0)
Monocytes Relative: 13 %
Neutro Abs: 3.9 10*3/uL (ref 1.7–7.7)
Neutrophils Relative %: 66 %
Platelets: 165 10*3/uL (ref 150–400)
RBC: 3.07 MIL/uL — ABNORMAL LOW (ref 4.22–5.81)
RDW: 15.8 % — ABNORMAL HIGH (ref 11.5–15.5)
WBC: 5.9 10*3/uL (ref 4.0–10.5)
nRBC: 0 % (ref 0.0–0.2)

## 2019-09-18 LAB — C-REACTIVE PROTEIN: CRP: 6.4 mg/dL — ABNORMAL HIGH (ref <1.0)

## 2019-09-18 LAB — FUNGUS CULTURE WITH STAIN

## 2019-09-18 LAB — PROTIME-INR
INR: 1.5 — ABNORMAL HIGH (ref 0.8–1.2)
Prothrombin Time: 17.1 seconds — ABNORMAL HIGH (ref 11.4–15.2)

## 2019-09-18 LAB — FUNGAL ORGANISM REFLEX

## 2019-09-18 LAB — LACTATE DEHYDROGENASE: LDH: 200 U/L — ABNORMAL HIGH (ref 98–192)

## 2019-09-18 LAB — SEDIMENTATION RATE: Sed Rate: 80 mm/hr — ABNORMAL HIGH (ref 0–16)

## 2019-09-18 LAB — MAGNESIUM: Magnesium: 1.5 mg/dL — ABNORMAL LOW (ref 1.7–2.4)

## 2019-09-18 LAB — FUNGUS CULTURE RESULT

## 2019-09-18 MED ORDER — WARFARIN SODIUM 5 MG PO TABS: ORAL_TABLET | ORAL | 3 refills | Status: DC

## 2019-09-18 NOTE — Progress Notes (Signed)
VAD Clinic Note   Date:  09/18/2019   ID:  Jonathan Wood, DOB Sep 16, 1970, MRN [id number]  Location: Home  Provider location: [facility] Type of Visit: Established patient  PCP: [name]  Cardiologist:  [name], MD Primary HF: Dr Jonathan Wood   Chief Complaint: Heart Failure/LVAD  History of Present Illness: Jonathan Wood is a 49 y.o. 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  Admitted 08/21/2018 with volume overload and anemia. Diuresed with IV lasix and transitioned to torsemide 20 mg daily. GI consulted EGD completed and showed gastritis and nonbleeding duodenal ulcers, colonoscopy with 5 polyps removed. Protonix increased to BID, he was started on PO iron, and hydrocortisone suppositories for hemorrhoid treatment. He received a total of 2 uPRBCs. He will stay off ASA for several weeks but can reconsider later.  Discharge weight was 221 pounds.   Admitted 12/7-15/20 for DL infection with overlying cellulitis and volume overload. Treated with IV abx and diuresis.   Recently admitted 08/16/19-09/13/19 for DL infection with large subxiphoid abscess. Underwent several debridements and eventually a pec muscle flap. Wound cx + MSSA. Initially started on ancef and rifabutin but expanded to cefipime for a week. Now back on IV ancef and po rifabutin,. Says he feels ok. But poor appetite. Denies fevers or chills. Taking abx as prescribed. Came to clinic today with DL dressing off. Denies orthopnea or PND.  No bleeding, melena or neuro symptoms. No VAD alarms.     VAD Indication: Destination Therapy   VAD interrogation & Equipment Management: Speed: 5600 Flow: 4.8 Power: 4.1w PI: 4.3  Alarms: multiple LOW VOLTAGE, low voltage advisories, and NO EXTERNAL POWERS on several days. Pt reports he thinks his MPU "came unplugged" Reports he has been staying  mostly on MPU Events: rare  Primary Controller: Replace back up battery in 46month Back up controller: Replace back up battery in months- did not bring back up equipment to clinic today  I reviewed the LVAD parameters from todayand compared the results to the patient's prior recorded data.LVAD interrogation wasNEGATIVE for significant power changes, POSITIVE for clinicalalarms and STABLE for PI events/speed drops. No programming changes were madeand pump is functioning within specified parameters.    Past Medical History:  Diagnosis Date  . Asthma   . [place] heart [place]) [place]    a. 09/2016: EF 20-25% with cath showing normal cors  . GERD (gastroesophageal reflux disease)   . History of hiatal hernia   . LVAD (left ventricular assist device) present ([place]   . OSA on CPAP 09/06/2018   Severe OSA with AHI 68/hr on CPAP at 12cm H2O   Past Surgical History:  Procedure Laterality Date  . APPLICATION OF A-CELL OF CHEST/ABDOMEN N/A 08/24/2019   Procedure: Application Of A-Cell Of Chest/Abdomen;  Surgeon: VIvin Poot MD;  [place]  Service: Thoracic;  Laterality: N/A;  . APPLICATION OF A-CELL OF CHEST/ABDOMEN N/A 09/05/2019   Procedure: Application Of A-Cell Of Chest/Abdomen;  Surgeon: DWallace Going DO;  [place]  Service: Plastics;  Laterality: N/A;  . APPLICATION OF A-CELL OF CHEST/ABDOMEN  09/03/2019   Procedure: Application Of A-Cell Of Chest/Abdomen;  Surgeon: VIvin Poot MD;  [place]  Service: Thoracic;;  . APPLICATION OF WOUND VAC N/A 08/22/2019   Procedure: Debridement, Irrigation and Packing of Abdominal Incision.;  Surgeon: VPrescott Gum  Collier Salina, MD;  Location: [place];  Service: Thoracic;  Laterality: N/A;  . APPLICATION OF WOUND VAC N/A 08/24/2019   Procedure: Irrigation and Debridement with WOUND VAC APPLICATION;  Surgeon: Ivin Poot, MD;  Location: [place];  Service: Thoracic;  Laterality: N/A;  . APPLICATION OF WOUND  VAC N/A 08/30/2019   Procedure: APPLICATION OF WOUND VAC;  Surgeon: Ivin Poot, MD;  Location: [place];  Service: Thoracic;  Laterality: N/A;  . APPLICATION OF WOUND VAC N/A 09/03/2019   Procedure: WOUND VAC CHANGE;  Surgeon: Ivin Poot, MD;  Location: [place];  Service: Thoracic;  Laterality: N/A;  . BIOPSY  08/23/2018   Procedure: BIOPSY;  Surgeon: Irving Copas., MD;  Location: [place];  Service: Gastroenterology;;  . COLONOSCOPY N/A 08/23/2018   Procedure: COLONOSCOPY;  Surgeon: Irving Copas., MD;  Location: [place];  Service: Gastroenterology;  Laterality: N/A;  . ENTEROSCOPY N/A 08/23/2018   Procedure: ENTEROSCOPY;  Surgeon: Rush Landmark Telford Nab., MD;  Location: [place];  Service: Gastroenterology;  Laterality: N/A;  . HEMOSTASIS CLIP PLACEMENT  08/23/2018   Procedure: HEMOSTASIS CLIP PLACEMENT;  Surgeon: Irving Copas., MD;  Location: [place];  Service: Gastroenterology;;  . IABP INSERTION N/A 09/05/2017   Procedure: IABP INSERTION;  Surgeon: Jolaine Artist, MD;  Location: [place] [facility];  Service: Cardiovascular;  Laterality: N/A;  . INSERTION OF IMPLANTABLE LEFT VENTRICULAR ASSIST DEVICE N/A 09/06/2017   Procedure: INSERTION OF IMPLANTABLE LEFT VENTRICULAR ASSIST DEVICE - HM3;  Surgeon: Ivin Poot, MD;  Location: [place];  Service: Open Heart Surgery;  Laterality: N/A;  HM3  . MUSCLE FLAP CLOSURE N/A 09/05/2019   Procedure: MUSCLE FLAP CLOSURE;  Surgeon: Wallace Going, DO;  Location: [place];  Service: Plastics;  Laterality: N/A;  . NASAL FRACTURE SURGERY  1987  . POLYPECTOMY  08/23/2018   Procedure: POLYPECTOMY;  Surgeon: Mansouraty, Telford Nab., MD;  Location: [place];  Service: Gastroenterology;;  . RIGHT HEART CATH N/A 08/30/2017   Procedure: RIGHT HEART CATH;  Surgeon: Jolaine Artist, MD;  Location: [place] [facility];  Service: Cardiovascular;  Laterality: N/A;  . RIGHT/LEFT HEART CATH AND CORONARY ANGIOGRAPHY  N/A 10/08/2016   Procedure: Right/Left Heart Cath and Coronary Angiography;  Surgeon: Dixie Dials, MD;  Location: [place] [facility];  Service: Cardiovascular;  Laterality: N/A;  . STERNAL WOUND DEBRIDEMENT N/A 08/20/2019   Procedure: DEBRIDEMENT OF LVAD DRIVELINE TUNNEL;  Surgeon: Ivin Poot, MD;  Location: [place];  Service: Thoracic;  Laterality: N/A;  . STERNAL WOUND DEBRIDEMENT N/A 09/05/2019   Procedure: DEBRIDEMENT AND CLOSURE OF ABDOMINAL WOUND;  Surgeon: Wallace Going, DO;  Location: [place];  Service: Plastics;  Laterality: N/A;  . SUBMUCOSAL TATTOO INJECTION  08/23/2018   Procedure: SUBMUCOSAL TATTOO INJECTION;  Surgeon: Irving Copas., MD;  Location: [place];  Service: Gastroenterology;;  . TEE WITHOUT CARDIOVERSION N/A 09/06/2017   Procedure: TRANSESOPHAGEAL ECHOCARDIOGRAM (TEE);  Surgeon: Prescott Gum, Collier Salina, MD;  Location: [place];  Service: Open Heart Surgery;  Laterality: N/A;  . WOUND DEBRIDEMENT N/A 08/30/2019   Procedure: Debridement Abdominal Wound;  Surgeon: Ivin Poot, MD;  Location: [facility];  Service: Thoracic;  Laterality: N/A;     Current Outpatient Medications  Medication Sig Dispense Refill  . ceFAZolin (ANCEF) IVPB Inject 2 g into the vein every 8 (eight) hours. Indication: Bacteremia and drive-line infection Last Day of Therapy:  10/14/19 Labs - Once weekly:  CBC/D and CMP, Labs -  Every other week:  ESR and CRP 120 Units 0  . DULoxetine (CYMBALTA) 60 MG capsule Take 1 capsule (60 mg total) by mouth daily. 30 capsule 3  . gabapentin (NEURONTIN) 300 MG capsule TAKE ONE CAPSULE BY MOUTH THREE TIMES DAILY (Patient taking differently: Take 300 mg by mouth 3 (three) times daily. ) 90 capsule 6  . hydrALAZINE (APRESOLINE) 50 MG tablet Take 1 tablet (50 mg total) by mouth 3 (three) times daily. 90 tablet 3  . linaclotide (LINZESS) 145 MCG CAPS capsule Take 1 capsule (145 mcg total) by mouth daily before breakfast. (Patient taking differently: Take 145 mcg  by mouth every other day. ) 30 capsule 4  . oxyCODONE 10 MG TABS Take 1 tablet (10 mg total) by mouth every 8 (eight) hours. 30 tablet 0  . pantoprazole (PROTONIX) 40 MG tablet Take 1 tablet (40 mg total) by mouth 2 (two) times daily. 60 tablet 6  . rifabutin (MYCOBUTIN) 150 MG capsule Take 2 capsules (300 mg total) by mouth daily. 60 capsule 1  . sildenafil (REVATIO) 20 MG tablet Take 1 tablet (20 mg total) by mouth 3 (three) times daily. 90 tablet 5  . spironolactone (ALDACTONE) 25 MG tablet Take 1 tablet (25 mg total) by mouth daily. 30 tablet 6  . traMADol (ULTRAM) 50 MG tablet Take 2 tablets (100 mg total) by mouth every 6 (six) hours as needed for moderate pain. 40 tablet 0  . zinc sulfate 220 (50 Zn) MG capsule Take 1 capsule (220 mg total) by mouth daily. 30 capsule 6  . docusate sodium (COLACE) 100 MG capsule Take 1 capsule (100 mg total) by mouth 3 (three) times daily. (Patient not taking: Reported on 09/18/2019) 90 capsule 2  . hydrOXYzine (ATARAX/VISTARIL) 25 MG tablet Take 1 tablet (25 mg total) by mouth at bedtime as needed for anxiety. (Patient not taking: Reported on 09/18/2019) 30 tablet 0  . senna-docusate (SENOKOT-S) 8.6-50 MG tablet Take 1 tablet by mouth 2 (two) times daily. (Patient not taking: Reported on 09/18/2019) 60 tablet 6  . torsemide (DEMADEX) 20 MG tablet Take 1 tablet (20 mg total) by mouth every other day. May take extra if needed (Patient not taking: Reported on 09/18/2019) 45 tablet 3  . warfarin (COUMADIN) 5 MG tablet Take 5 mg (1 tab) daily or as directed by Heart Failure clinic 180 tablet 3   No current facility-administered medications for this encounter.    Allergies:   Patient has no known allergies.   Social History:  The patient  reports that he has been smoking cigarettes. He has a 12.50 pack-year smoking history. He has never used smokeless tobacco. He reports current alcohol use. He reports that he does not use drugs.   Family History:  The patient's family  history includes Hypertension in his father.   ROS:  Please see the history of present illness.   All other systems are personally reviewed and negative.    Vital Signs: Temp: 98.5  Doppler Pressure: 90 Automatc BP: 113/61 (89) HR: 90 SPO2: 98 % RA  Weight: 218.4  lbs w/ eqt Last weight: 209.8 lbs w/o equip @ hospital d/c   Exam:   General:  NAD.  HEENT: normal  Neck: supple. JVP not elevated.  Carotids 2+ bilat; no bruits. No lymphadenopathy or thryomegaly appreciated. Cor: LVAD hum. Midline dressing intact. Left lateral wound redressed. JP drain x 2 with mild serosanguinous drainage.  Lungs: Clear. Abdomen: obese soft, nontender, non-distended. No hepatosplenomegaly. No bruits or  masses. Good bowel sounds. Driveline site ok. Dressing off.  Extremities: no cyanosis, clubbing, rash. Warm no edema  Neuro: alert & oriented x 3. No focal deficits. Moves all 4 without problem   Recent Labs: 04/23/2019: B Natriuretic Peptide 709.6 08/13/2019: TSH 0.209 09/18/2019: ALT 7; BUN 5; Creatinine, Ser 1.03; Hemoglobin 8.8; Magnesium 1.5; Platelets 165; Potassium 4.0; Sodium 136    Wt Readings from Last 3 Encounters:  09/18/19 105.2 kg (232 lb)  09/18/19 99.1 kg (218 lb 6.4 oz)  09/13/19 95.2 kg (209 lb 14.1 oz)      ASSESSMENT AND PLAN:  1. Recurrent DL infection with subxiphoid abscess - s/p debridement and pec flap in 4/21 - remains on cefazolin and rifabutin - long discussion with him and his mother about need for better wound care and care of DL - will switch to clinic DL dressing changes 1x/week - has appt with Dr. Marla Roe later today - we discussed that he was not a candidate for pump exchange given severe RV dysfunction if infection getting worse. I also discussed that fact that we were frustrated by the disconnect between what he says he is going to do to take care of himself and the fact that he rarely does it  2. Chronic systolic HF - EF 18% s/p HM-3 LVAD on  09/06/17 - Volume status looks good after in-hospital diuresis - NYHA III - RV severely HK   3. HM3 LVAD 09/06/2017.  - VAD interrogated personally. Parameters stable. - LDH 200 - INR 1.5 goal 2.0-2.5 Discussed dosing with PharmD personally. - Off ASA with PUD.   4. Essential HTN - MAPs ok  - Had AKI with Entresto in past  5. OSA - sleep study with very severe OSA (AHI 69/hr) - continues to refuse therapy. We have arranged f/u in sleep clinic several times but he has not been complaint  6. H/o GI bleed  - 08/2018 EGD completed and showed gastritis and nonbleeding duodenal ulcers, colonoscopy with 5 polyps removed. Protonix was increased to BID.  - No recent GI bleeding - hgb 8.8 - He will stay off aspirin for now.   8. Tobacco Abuse  - says he isn;t smoking currently  9. Elevated bilirubin and low pre-albumin - suspected related to heavy ETOH use and RV failure - improved today  Total time spent 55 minutes. Over half that time spent discussing above.   Signed, [name], MD  09/18/2019 10:35 PM  [place] [address] [facility] [place] [id number] [phone] (office) [phone] (fax)

## 2019-09-18 NOTE — Addendum Note (Signed)
Encounter addended by: Dolores Patty, MD on: 09/18/2019 10:49 PM  Actions taken: Clinical Note Signed, Level of Service modified, Visit diagnoses modified, Charge Capture section accepted

## 2019-09-18 NOTE — Progress Notes (Signed)
LVAD INR 

## 2019-09-18 NOTE — Progress Notes (Signed)
Patient presents for hospital d/c f/u in VAD clinic today with his mom. Denies any issues with his VAD or alarms.   Pt arrived in w/c with his mother pushing him. When asked why he is in w/c he reports he "was being lazy."   Pt states he has been doing ok since going home. Reports poor PO intake do to lack of appetite and lack of taste. Has been taking IV and PO antibiotics as prescribed. Missed 2 doses of Coumadin stating "I ran out of pills." He did not notify VAD coordinators re: lack of medication and did not contact his pharmacy.    States he is having some sternal pain. Taking Tramadol more often than Oxycodone stating "I know once the Oxy is gone, it's gone." States Tramadol is effective for pain management.   Pt presents with no dressing over drive line site. He is carrying controller in left pants pocket with batteries in his conceal carry shirt. Existing dressing pulled most off the way off. Per patient dressing has been off "for a few days."  No anchor in place. JP drain tubing wrapped around drive line near exit site. Huge dressing covering JP drains. Per pt and mother "dressing wound not stick so I just added more." Myself and Dr [name] had long discussion with patient regarding drive line dressing maintenance and taking responsibility for his care. Attempted to reeducate pt and mother re: drive line care and JP drain dressing changes; unsure if either were paying close attention.      Vital Signs: Temp: 98.5  Doppler Pressure: 90 Automatc BP: 113/61 (89) HR: 90 SPO2: 98 % RA  Weight: 218.4  lbs w/ eqt Last weight: 209.8 lbs w/o equip @ hospital d/c  VAD Indication: Destination Therapy   VAD interrogation & Equipment Management: Speed: 5600 Flow: 4.8 Power: 4.1w    PI: 4.3  Alarms: multiple LOW VOLTAGE, low voltage advisories, and NO EXTERNAL POWERS on several days. Pt reports he thinks his MPU "came unplugged" Reports he has been staying mostly on MPU Events:  rare  Primary Controller:  Replace back up battery in 17 months Back up controller:   Replace back up battery in   months- did not bring back up equipment to clinic today  I reviewed the LVAD parameters from today and compared the results to the patient's prior recorded data.  LVAD interrogation was NEGATIVE for significant power changes, POSITIVE for clinical alarms and STABLE for PI events/speed drops. No programming changes were made and pump is functioning within specified parameters.    Exit Site Care: Drive line is being maintained weekly. Current dressing is coming off, no anchor present, drive line is bent at an extreme angle from exit site. Discussed importance of drive line positioning for tissue ingrowth and to prevent lead fractures. See above for further discussion. Existing VAD dressing removed and site care performed using sterile technique. Drive line exit site cleaned with Chlora prep applicators x 2, allowed to dry, and Sorbaview dressing with biopatch reapplied. Anchor reapplied. Drive line exit site incorporated, the velour is fully implanted at exit site. No drainage, erythema, tenderness, or foul odor noted.  Pt denies fever or chills. Continue weekly dressing changes. VAD coordinators to perform weekly dressing changes. Reinforced to mother and pt if dressing is coming off prior to dressing change appointment it MUST be changed. Provided patient with 4 weekly dressing kits for home use.   Device:none  BP & Labs:  Doppler 90 - reflecting MAP  Hgb 8.8 - No S/S of bleeding. Specifically denies melena/BRBPR or nosebleeds.  LDH 200-  established baseline of 200-300. Denies tea-colored urine. No power elevations noted on interrogation.   Patient Instructions:  1. No medication changes today 2. Coumadin dosing per Leotis Shames PharmD  3. Return to clinic in 1 week for dressing change  4. Return to VAD clinic in 1 month for full VAD appt  Alyce Pagan RN VAD Coordinator   Office: [phone]  24/7 Pager: [phone]

## 2019-09-18 NOTE — Patient Instructions (Signed)
1. No medication changes today 2. Coumadin dosing per Leotis Shames PharmD  3. Return to clinic in 1 week for dressing change  4. Return to VAD clinic in 1 month for full up appointment

## 2019-09-18 NOTE — Progress Notes (Signed)
Patient is a 49 year old male here for follow-up after pectoralis turnover flap for coverage of abdominal wound, placement of ACell powder, placement of VAC, excision of skin 09/05/2019 with Dr. Ulice Bold and Dr. Maren Beach.  He was seen today at the LVAD clinic and they changed his dressings. He reports that he is feeling well today. He does not have any fevers, chills, nausea, vomiting. Not having any chest pain or shortness of breath. He reports some pain upper ribs near origin of pectoralis major.  Patient has 2 JP drains in place, he reports he changes them daily at night. Last night he had approximately 46 cc in 1 drain and 50 cc in the other drain. The drainage is serosanguineous in nature. There is no peri-JP drain insertion site erythema or drainage noted. Today on evaluation both JP drains have approximately 25 cc of serosanguineous fluid.  On exam he has a LVAD driveline in place along left abdomen that is dressed. He has a Mepilex border dressing over midline sternal incision. Midline sternal incision is healing nicely and is mostly C/D/I. He does have a small wound near midline of the sternal incision it is approximately 0.5 x 0.5 x 0.1 cm. It does not have any periwound erythema. There is no foul odor. There is a little bit of serous drainage.  He has 2 JP drains in place along left lower chest wall. Some tenderness to palpation at the upper ribs near origin of pectoralis major, no overlying erythema or fluid collections noted.  Will order supplies for dressing changes over sternal incision and JP drain insertion sites. Apply Vaseline daily to medial JP drain insertion site and to sternal wound. I removed the medial JP drain. Follow-up in 1 week, possibly remove lateral JP drain depending on output.  Call with any questions or concerns.  Pictures were obtained of the patient and placed in the chart with the patient's or guardian's permission.

## 2019-09-20 ENCOUNTER — Other Ambulatory Visit (HOSPITAL_COMMUNITY): Payer: Self-pay | Admitting: Unknown Physician Specialty

## 2019-09-20 DIAGNOSIS — Z95811 Presence of heart assist device: Secondary | ICD-10-CM

## 2019-09-20 DIAGNOSIS — Z7901 Long term (current) use of anticoagulants: Secondary | ICD-10-CM

## 2019-09-24 ENCOUNTER — Encounter: Payer: Self-pay | Admitting: Internal Medicine

## 2019-09-25 ENCOUNTER — Other Ambulatory Visit: Payer: Self-pay

## 2019-09-25 ENCOUNTER — Encounter: Payer: Self-pay | Admitting: Nurse Practitioner

## 2019-09-25 ENCOUNTER — Other Ambulatory Visit: Payer: Self-pay | Admitting: Nurse Practitioner

## 2019-09-25 ENCOUNTER — Ambulatory Visit: Payer: Medicare Other | Attending: Nurse Practitioner | Admitting: Nurse Practitioner

## 2019-09-25 DIAGNOSIS — F419 Anxiety disorder, unspecified: Secondary | ICD-10-CM | POA: Insufficient documentation

## 2019-09-25 DIAGNOSIS — G4733 Obstructive sleep apnea (adult) (pediatric): Secondary | ICD-10-CM | POA: Insufficient documentation

## 2019-09-25 DIAGNOSIS — F329 Major depressive disorder, single episode, unspecified: Secondary | ICD-10-CM | POA: Insufficient documentation

## 2019-09-25 DIAGNOSIS — G629 Polyneuropathy, unspecified: Secondary | ICD-10-CM | POA: Insufficient documentation

## 2019-09-25 DIAGNOSIS — Z87891 Personal history of nicotine dependence: Secondary | ICD-10-CM | POA: Diagnosis not present

## 2019-09-25 DIAGNOSIS — Z7689 Persons encountering health services in other specified circumstances: Secondary | ICD-10-CM

## 2019-09-25 DIAGNOSIS — I428 Other cardiomyopathies: Secondary | ICD-10-CM | POA: Insufficient documentation

## 2019-09-25 DIAGNOSIS — Z8249 Family history of ischemic heart disease and other diseases of the circulatory system: Secondary | ICD-10-CM | POA: Insufficient documentation

## 2019-09-25 DIAGNOSIS — I5023 Acute on chronic systolic (congestive) heart failure: Secondary | ICD-10-CM | POA: Insufficient documentation

## 2019-09-25 DIAGNOSIS — Z79899 Other long term (current) drug therapy: Secondary | ICD-10-CM | POA: Diagnosis not present

## 2019-09-25 NOTE — Progress Notes (Signed)
Virtual Visit via Telephone Note Due to national recommendations of social distancing due to COVID 19, telehealth visit is felt to be most appropriate for this patient at this time.  I discussed the limitations, risks, security and privacy concerns of performing an evaluation and management service by telephone and the availability of in person appointments. I also discussed with the patient that there may be a patient responsible charge related to this service. The patient expressed understanding and agreed to proceed.    I connected with Jonathan Wood on 09/25/19  at   2:30 PM EDT  EDT by telephone and verified that I am speaking with the correct person using two identifiers.   Consent I discussed the limitations, risks, security and privacy concerns of performing an evaluation and management service by telephone and the availability of in person appointments. I also discussed with the patient that there may be a patient responsible charge related to this service. The patient expressed understanding and agreed to proceed.   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   Anxiety- Takes hydroxyzine 25 mg daily prn Neuropathy-Takes gabapentin/cymbalta which can also help with anxiety  and depression   He is being followed by Plastics, ID and Cardiology.  I will follow him for his primary care needs only at this time. I will be glad to refill his anxiety/depression medications as needed.   Past Medical History:  Diagnosis Date  . Asthma   . CHF (congestive heart failure) (HCC)    a. 09/2016: EF 20-25% with cath showing normal cors  . GERD (gastroesophageal reflux disease)   . History of hiatal hernia   . LVAD (left ventricular assist device) present (HCC)   . OSA on CPAP 09/06/2018   Severe OSA with AHI 68/hr on CPAP at 12cm H2O    Past Surgical History:  Procedure Laterality Date  . APPLICATION OF A-CELL OF CHEST/ABDOMEN N/A 08/24/2019   Procedure: Application Of A-Cell Of Chest/Abdomen;  Surgeon: Kerin Perna, MD;  Location: [facility] OR;  Service: Thoracic;  Laterality: N/A;  . APPLICATION OF A-CELL OF CHEST/ABDOMEN N/A 09/05/2019   Procedure: Application Of A-Cell Of Chest/Abdomen;  Surgeon: Peggye Form, DO;  Location: [facility] OR;  Service: Plastics;  Laterality: N/A;  . APPLICATION OF A-CELL OF CHEST/ABDOMEN  09/03/2019   Procedure: Application Of A-Cell Of Chest/Abdomen;  Surgeon: Kerin Perna, MD;  Location: [facility] OR;  Service: Thoracic;;  . APPLICATION OF WOUND VAC N/A 08/22/2019   Procedure: Debridement, Irrigation and Packing of Abdominal Incision.;  Surgeon: Kerin Perna, MD;  Location: [facility] OR;  Service: Thoracic;  Laterality: N/A;  . APPLICATION OF WOUND VAC N/A 08/24/2019   Procedure: Irrigation and Debridement with WOUND VAC APPLICATION;  Surgeon: Kerin Perna, MD;  Location: [facility] OR;  Service: Thoracic;  Laterality: N/A;  . APPLICATION OF WOUND VAC N/A 08/30/2019   Procedure: APPLICATION OF WOUND VAC;  Surgeon: Kerin Perna, MD;  Location: [facility] OR;  Service: Thoracic;  Laterality: N/A;  . APPLICATION OF WOUND VAC N/A 09/03/2019   Procedure: WOUND VAC CHANGE;  Surgeon: Kerin Perna, MD;  Location: [facility] OR;  Service: Thoracic;  Laterality: N/A;  .  BIOPSY  08/23/2018   Procedure: BIOPSY;  Surgeon: Lemar Lofty., MD;  Location: [facility] ENDOSCOPY;  Service: Gastroenterology;;  . COLONOSCOPY N/A 08/23/2018   Procedure: COLONOSCOPY;  Surgeon: Lemar Lofty., MD;  Location: [facility] ENDOSCOPY;  Service: Gastroenterology;  Laterality: N/A;  . ENTEROSCOPY N/A 08/23/2018   Procedure: ENTEROSCOPY;  Surgeon: Meridee Score Netty Starring., MD;  Location: [facility] ENDOSCOPY;  Service: Gastroenterology;  Laterality: N/A;  . HEMOSTASIS CLIP PLACEMENT  08/23/2018   Procedure: HEMOSTASIS CLIP PLACEMENT;  Surgeon: Lemar Lofty., MD;  Location: [facility] ENDOSCOPY;  Service: Gastroenterology;;  . IABP INSERTION N/A 09/05/2017   Procedure: IABP INSERTION;  Surgeon: Dolores Patty, MD;  Location: [facility] INVASIVE CV LAB;  Service: Cardiovascular;  Laterality: N/A;  . INSERTION OF IMPLANTABLE LEFT VENTRICULAR ASSIST DEVICE N/A 09/06/2017   Procedure: INSERTION OF IMPLANTABLE LEFT VENTRICULAR ASSIST DEVICE - HM3;  Surgeon: Kerin Perna, MD;  Location: [facility] OR;  Service: Open Heart Surgery;  Laterality: N/A;  HM3  . MUSCLE FLAP CLOSURE N/A 09/05/2019   Procedure: MUSCLE FLAP CLOSURE;  Surgeon: Peggye Form, DO;  Location: [facility] OR;  Service: Plastics;  Laterality: N/A;  . NASAL FRACTURE SURGERY  1987  . POLYPECTOMY  08/23/2018   Procedure: POLYPECTOMY;  Surgeon: Mansouraty, Netty Starring., MD;  Location: [facility] ENDOSCOPY;  Service: Gastroenterology;;  . RIGHT HEART CATH N/A 08/30/2017   Procedure: RIGHT HEART CATH;  Surgeon: Dolores Patty, MD;  Location: [facility] INVASIVE CV LAB;  Service: Cardiovascular;  Laterality: N/A;  . RIGHT/LEFT HEART CATH AND CORONARY ANGIOGRAPHY N/A 10/08/2016   Procedure: Right/Left Heart Cath and Coronary Angiography;  Surgeon: Orpah Cobb, MD;  Location: [facility] INVASIVE CV LAB;  Service: Cardiovascular;  Laterality: N/A;  . STERNAL WOUND DEBRIDEMENT N/A 08/20/2019   Procedure: DEBRIDEMENT OF LVAD DRIVELINE TUNNEL;  Surgeon: Kerin Perna, MD;   Location: [facility] OR;  Service: Thoracic;  Laterality: N/A;  . STERNAL WOUND DEBRIDEMENT N/A 09/05/2019   Procedure: DEBRIDEMENT AND CLOSURE OF ABDOMINAL WOUND;  Surgeon: Peggye Form, DO;  Location: [facility] OR;  Service: Plastics;  Laterality: N/A;  . SUBMUCOSAL TATTOO INJECTION  08/23/2018   Procedure: SUBMUCOSAL TATTOO INJECTION;  Surgeon: Lemar Lofty., MD;  Location: [facility] ENDOSCOPY;  Service: Gastroenterology;;  . TEE WITHOUT CARDIOVERSION N/A 09/06/2017   Procedure: TRANSESOPHAGEAL ECHOCARDIOGRAM (TEE);  Surgeon: Donata Clay, Theron Arista, MD;  Location: [facility] OR;  Service: Open Heart Surgery;  Laterality: N/A;  . WOUND DEBRIDEMENT N/A 08/30/2019   Procedure: Debridement Abdominal Wound;  Surgeon: Kerin Perna, MD;  Location: [facility] OR;  Service: Thoracic;  Laterality: N/A;    Family History  Problem Relation Age of Onset  . Hypertension Father   . Colon cancer Neg Hx   . Esophageal cancer Neg Hx   . Inflammatory bowel disease Neg Hx   . Liver disease Neg Hx   . Pancreatic cancer Neg Hx   . Rectal cancer Neg Hx   . Stomach cancer Neg Hx   . Diabetes Neg Hx     Social History   Socioeconomic History  . Marital status: Legally Separated    Spouse name:  Not on file  . Number of children: 3  . Years of education: Not on file  . Highest education level: Not on file  Occupational History  . Not on file  Tobacco Use  . Smoking status: Former Smoker    Packs/day: 0.50    Years: 25.00    Pack years: 12.50    Types: Cigarettes  . Smokeless tobacco: Never Used  . Tobacco comment: quit 08-16-2019  Substance and Sexual Activity  . Alcohol use: Not Currently    Comment: occas  . Drug use: Not Currently  . Sexual activity: Yes  Other Topics Concern  . Not on file  Social History Narrative   Lives at home with his three children (51, 2, and 66 years of age).    Social Determinants of Health   Financial Resource Strain:   . Difficulty of Paying Living Expenses:   Food Insecurity:   .  Worried About Charity fundraiser in the Last Year:   . Arboriculturist in the Last Year:   Transportation Needs:   . Film/video editor (Medical):   Marland Kitchen Lack of Transportation (Non-Medical):   Physical Activity:   . Days of Exercise per Week:   . Minutes of Exercise per Session:   Stress:   . Feeling of Stress :   Social Connections:   . Frequency of Communication with Friends and Family:   . Frequency of Social Gatherings with Friends and Family:   . Attends Religious Services:   . Active Member of Clubs or Organizations:   . Attends Archivist Meetings:   Marland Kitchen Marital Status:      Observations/Objective: Awake, alert and oriented x 3   Review of Systems  Constitutional: Negative for fever, malaise/fatigue and weight loss.  HENT: Negative.  Negative for nosebleeds.   Eyes: Negative.  Negative for blurred vision, double vision and photophobia.  Respiratory: Negative.  Negative for cough and shortness of breath.   Cardiovascular: Negative.  Negative for chest pain, palpitations and leg swelling.  Gastrointestinal: Negative.  Negative for heartburn, nausea and vomiting.  Musculoskeletal: Negative for myalgias.       Neuropathy  Neurological: Negative.  Negative for dizziness, focal weakness, seizures and headaches.  Psychiatric/Behavioral: Positive for depression. Negative for suicidal ideas. The patient is nervous/anxious.     Assessment and Plan: Sims was seen today for establish care.  Diagnoses and all orders for this visit:  Encounter to establish care     Follow Up Instructions Return if symptoms worsen or fail to improve.     I discussed the assessment and treatment plan with the patient. The patient was provided an opportunity to ask questions and all were answered. The patient agreed with the plan and demonstrated an understanding of the instructions.   The patient was advised to call back or seek an in-person evaluation if the symptoms worsen or  if the condition fails to improve as anticipated.  I provided 20 minutes of non-face-to-face time during this encounter including median intraservice time, reviewing previous notes, labs, imaging, medications and explaining diagnosis and management.  Gildardo Pounds, FNP-BC

## 2019-09-26 ENCOUNTER — Ambulatory Visit (HOSPITAL_COMMUNITY)
Admission: RE | Admit: 2019-09-26 | Discharge: 2019-09-26 | Disposition: A | Discharge status: Home / Self Care | Payer: Medicare Other | Source: Ambulatory Visit | Attending: Internal Medicine | Admitting: Internal Medicine

## 2019-09-26 ENCOUNTER — Ambulatory Visit (HOSPITAL_COMMUNITY): Payer: Self-pay | Admitting: Pharmacist

## 2019-09-26 ENCOUNTER — Other Ambulatory Visit: Payer: Self-pay

## 2019-09-26 ENCOUNTER — Ambulatory Visit (INDEPENDENT_AMBULATORY_CARE_PROVIDER_SITE_OTHER): Payer: Medicaid Other | Admitting: Internal Medicine

## 2019-09-26 ENCOUNTER — Encounter: Payer: Self-pay | Admitting: Internal Medicine

## 2019-09-26 ENCOUNTER — Telehealth: Payer: Self-pay | Admitting: *Deleted

## 2019-09-26 DIAGNOSIS — Z95811 Presence of heart assist device: Secondary | ICD-10-CM | POA: Diagnosis present

## 2019-09-26 DIAGNOSIS — T827XXA Infection and inflammatory reaction due to other cardiac and vascular devices, implants and grafts, initial encounter: Secondary | ICD-10-CM | POA: Diagnosis not present

## 2019-09-26 DIAGNOSIS — Z7901 Long term (current) use of anticoagulants: Secondary | ICD-10-CM | POA: Insufficient documentation

## 2019-09-26 LAB — PROTIME-INR
INR: 4.9 (ref 0.8–1.2)
Prothrombin Time: 44.2 seconds — ABNORMAL HIGH (ref 11.4–15.2)

## 2019-09-26 MED ORDER — CEPHALEXIN 500 MG PO CAPS: 500.0000 mg | ORAL_CAPSULE | Freq: Three times a day (TID) | ORAL | 11 refills | Status: DC

## 2019-09-26 MED ORDER — WARFARIN SODIUM 5 MG PO TABS: ORAL_TABLET | ORAL | 3 refills | Status: DC

## 2019-09-26 MED ORDER — RIFABUTIN 150 MG PO CAPS: 300.0000 mg | ORAL_CAPSULE | Freq: Every day | ORAL | 1 refills | Status: AC

## 2019-09-26 MED ORDER — CEFAZOLIN IV (FOR PTA / DISCHARGE USE ONLY)
2.0000 g | Freq: Three times a day (TID) | INTRAVENOUS | Status: AC
Start: 2019-09-26 — End: 2019-10-14

## 2019-09-26 NOTE — Telephone Encounter (Signed)
Called [facility] for conformation--10/14/2019 last dose antibiotic and PICC line out.

## 2019-09-26 NOTE — Addendum Note (Signed)
Encounter addended by: Levonne Spiller, RN on: 09/26/2019 10:42 AM  Actions taken: Clinical Note Signed

## 2019-09-26 NOTE — Progress Notes (Signed)
LVAD INR 

## 2019-09-26 NOTE — Progress Notes (Signed)
[place] for Infectious Disease  Patient Active Problem List   Diagnosis Date Noted  . Bacteremia due to methicillin susceptible Staphylococcus aureus (MSSA) 08/18/2019    Priority: High  . Infection associated with driveline of left ventricular assist device (LVAD) ([place]) 02/06/2018    Priority: High  . Generalized anxiety disorder 09/03/2019  . Cigarette smoker 08/18/2019  . COPD (chronic obstructive pulmonary disease) ([place]) 08/18/2019  . Hypertension 08/18/2019  . Sepsis ([place]) 08/16/2019  . Increased abdominal girth 10/11/2018  . Chronic constipation 10/11/2018  . Rectal bleeding 10/11/2018  . History of colonic polyps 09/24/2018  . Hemorrhoids 09/24/2018  . Long term current use of anticoagulant therapy 09/24/2018  . OSA on CPAP 09/06/2018  . Chronic combined systolic (congestive) and diastolic (congestive) heart failure ([place]) 10/26/2017  . Anxiety 09/30/2017  . Normocytic anemia 09/30/2017  . LVAD (left ventricular assist device) present ([place])   . Advanced care planning/counseling discussion   . Goals of care, counseling/discussion   . Palliative care encounter   . Elevated LFTs 08/25/2017    Patient's Medications  New Prescriptions   CEPHALEXIN (KEFLEX) 500 MG CAPSULE    Take 1 capsule (500 mg total) by mouth 3 (three) times daily.  Previous Medications   DOCUSATE SODIUM (COLACE) 100 MG CAPSULE    Take 1 capsule (100 mg total) by mouth 3 (three) times daily.   DULOXETINE (CYMBALTA) 60 MG CAPSULE    Take 1 capsule (60 mg total) by mouth daily.   GABAPENTIN (NEURONTIN) 300 MG CAPSULE    TAKE ONE CAPSULE BY MOUTH THREE TIMES DAILY   HYDRALAZINE (APRESOLINE) 50 MG TABLET    Take 1 tablet (50 mg total) by mouth 3 (three) times daily.   HYDROXYZINE (ATARAX/VISTARIL) 25 MG TABLET    Take 1 tablet (25 mg total) by mouth at bedtime as needed for anxiety.   LINACLOTIDE (LINZESS) 145 MCG CAPS CAPSULE    Take 1 capsule (145 mcg total) by mouth daily before  breakfast.   OXYCODONE 10 MG TABS    Take 1 tablet (10 mg total) by mouth every 8 (eight) hours.   PANTOPRAZOLE (PROTONIX) 40 MG TABLET    Take 1 tablet (40 mg total) by mouth 2 (two) times daily.   SENNA-DOCUSATE (SENOKOT-S) 8.6-50 MG TABLET    Take 1 tablet by mouth 2 (two) times daily.   SILDENAFIL (REVATIO) 20 MG TABLET    Take 1 tablet (20 mg total) by mouth 3 (three) times daily.   SPIRONOLACTONE (ALDACTONE) 25 MG TABLET    Take 1 tablet (25 mg total) by mouth daily.   TORSEMIDE (DEMADEX) 20 MG TABLET    Take 1 tablet (20 mg total) by mouth every other day. May take extra if needed   TRAMADOL (ULTRAM) 50 MG TABLET    Take 2 tablets (100 mg total) by mouth every 6 (six) hours as needed for moderate pain.   WARFARIN (COUMADIN) 5 MG TABLET    Take 5 mg (1 tab) daily or as directed by Heart Failure clinic   ZINC SULFATE 220 (50 ZN) MG CAPSULE    Take 1 capsule (220 mg total) by mouth daily.  Modified Medications   Modified Medication Previous Medication   CEFAZOLIN (ANCEF) IVPB ceFAZolin (ANCEF) IVPB      Inject 2 g into the vein every 8 (eight) hours for 18 days. Indication: Bacteremia and drive-line infection Last Day of Therapy:  10/14/19 Labs - Once weekly:  CBC/D and  CMP, Labs - Every other week:  ESR and CRP    Inject 2 g into the vein every 8 (eight) hours. Indication: Bacteremia and drive-line infection Last Day of Therapy:  10/14/19 Labs - Once weekly:  CBC/D and CMP, Labs - Every other week:  ESR and CRP   RIFABUTIN (MYCOBUTIN) 150 MG CAPSULE rifabutin (MYCOBUTIN) 150 MG capsule      Take 2 capsules (300 mg total) by mouth daily for 18 days.    Take 2 capsules (300 mg total) by mouth daily.  Discontinued Medications   No medications on file    Subjective: Jonathan Wood is in for his hospital follow-up visit.  He is accompanied by his mother.  He has nonischemic cardiomyopathy and underwent LVAD placement in 2019.  He was admitted to the hospital in early December of last year with  abdominal wall cellulitis and driveline tract drainage.  Drainage cultures grew MSSA.  Blood cultures were negative.  He was treated with 9 days of IV cefazolin before transitioning to oral doxycycline which she completed on 05/11/2019.  He said that he was doing much better at that time.    In late March he began to have lower chest and upper abdominal discomfort.  He had one episode of shaking chills.    He was seen in the LVAD clinic on 08/16/2019.  His driveline dressing was dislodged and a scant amount of drainage was noted.  His white blood cell count was elevated at 27,000 so he was directed to the emergency department for admission.  His temperature upon arrival went up to 101.5 degrees. Admission blood cultures grew MSSA.  Repeat blood cultures on 08/18/2019 were negative. There was no evidence of endocarditis on TTE.  CT scan revealed:  IMPRESSION: 1. New fluid collections along the efferent tubing from the ventricular cyst device to the ascending aorta. Findings are suspicious for infection given clinical presentation and elevated white blood cell count. 2. Stable fluid surrounding the line within the left upper quadrant anterior abdominal wall, with decreased adjacent fat stranding. Infection in this region cannot be excluded. 3. Stable cardiomegaly. 4. Stable emphysema. 5. Otherwise no acute intra-abdominal or intrapelvic process.  He underwent incision and drainage on 08/20/2019.  Operative cultures grew MSSA.  He was treated with cefazolin and rifabutin.  He underwent 4 more debridements over the next 2 weeks and eventually had a pectoral flap reconstruction on 09/05/2019.  His VAC dressing was removed and he was discharged on 09/13/2019.  He has had no problems tolerating his PICC or cefazolin.  When I asked him about his rifabutin he became confused and said that he is not sure that he has a supply at home or if he has been taking it correctly.  He still has 1 JP drain in place.  He says  that output has been decreasing.  Review of Systems: Review of Systems  Constitutional: Negative for chills, diaphoresis and fever.  Gastrointestinal: Negative for abdominal pain, diarrhea, nausea and vomiting.    Past Medical History:  Diagnosis Date  . Asthma   . CHF (congestive heart failure) ([place])    a. 09/2016: EF 20-25% with cath showing normal cors  . GERD (gastroesophageal reflux disease)   . History of hiatal hernia   . LVAD (left ventricular assist device) present ([place])   . OSA on CPAP 09/06/2018   Severe OSA with AHI 68/hr on CPAP at 12cm H2O    Social History   Tobacco Use  . Smoking status:  Former Smoker    Packs/day: 0.50    Years: 25.00    Pack years: 12.50    Types: Cigarettes  . Smokeless tobacco: Never Used  . Tobacco comment: quit 08-16-2019  Substance Use Topics  . Alcohol use: Not Currently    Comment: occas  . Drug use: Not Currently    Family History  Problem Relation Age of Onset  . Hypertension Father   . Colon cancer Neg Hx   . Esophageal cancer Neg Hx   . Inflammatory bowel disease Neg Hx   . Liver disease Neg Hx   . Pancreatic cancer Neg Hx   . Rectal cancer Neg Hx   . Stomach cancer Neg Hx   . Diabetes Neg Hx     No Known Allergies  Objective: Vitals:   09/26/19 1112  Temp: (!) 97.5 F (36.4 C)  Weight: 225 lb (102.1 kg)  Height: '6\' 2"'$  (1.88 m)   Body mass index is 28.89 kg/m.  Physical Exam Constitutional:      Comments: He is quite pleasant and in no distress.  Cardiovascular:     Comments: LVAD hum.  He has clean, dry gauze dressings on incision and driveline exit site.  He has about 30 cc of serosanguineous fluid in his JP drain bulb. Pulmonary:     Effort: Pulmonary effort is normal.     Breath sounds: Normal breath sounds.  Skin:    Comments: Right arm PICC site looks good.       Problem List Items Addressed This Visit      High   Infection associated with driveline of left ventricular assist device (LVAD)  ([place])    He is doing well on therapy for MSSA bacteremia complicating LVAD infection.  He will continue IV cefazolin through 10/14/2019, which will be 6 weeks from his pectoral flap reconstruction.  I asked him to make sure he is taking rifabutin correctly.  His PICC line will be removed after his last dose of IV cefazolin on 10/14/2019 and I will change him to oral cephalexin as chronic suppressive therapy.  He will follow-up here on 10/16/2019      Relevant Medications   ceFAZolin (ANCEF) IVPB   cephALEXin (KEFLEX) 500 MG capsule (Start on 10/15/2019)       Michel Bickers, MD [facility] [phone] pager   [phone] cell 09/26/2019, 11:46 AM

## 2019-09-26 NOTE — Assessment & Plan Note (Signed)
He is doing well on therapy for MSSA bacteremia complicating LVAD infection.  He will continue IV cefazolin through 10/14/2019, which will be 6 weeks from his pectoral flap reconstruction.  I asked him to make sure he is taking rifabutin correctly.  His PICC line will be removed after his last dose of IV cefazolin on 10/14/2019 and I will change him to oral cephalexin as chronic suppressive therapy.  He will follow-up here on 10/16/2019

## 2019-09-26 NOTE — Progress Notes (Signed)
Patient presents to clinic with is mother today for dressing change.   Exit Site Care: Drive line is being maintained weekly by VAD coordinators. Existing VAD dressing removed and site care performed using sterile technique. Drive line exit site cleaned with Chlora prep applicators x 2, allowed to dry, and Sorbaview dressing with biopatch reapplied. Anchor reapplied. Drive line exit site incorporated, the velour is fully implanted at exit site. No drainage, erythema, tenderness, or foul odor noted.  Pt denies fever or chills. Continue weekly dressing changes. VAD coordinators to perform weekly dressing changes. Reinforced to mother and pt if dressing is coming off prior to dressing change appointment it MUST be changed. Pt has adequate dressing supplies at home.  Return to clinic in 1 week for dressing change and INR.  Hessie Diener RN VAD Coordinator Office: [phone] Pager: [phone]

## 2019-09-28 ENCOUNTER — Ambulatory Visit (INDEPENDENT_AMBULATORY_CARE_PROVIDER_SITE_OTHER): Payer: Medicaid Other | Admitting: Surgical

## 2019-09-28 ENCOUNTER — Other Ambulatory Visit (HOSPITAL_COMMUNITY): Payer: Self-pay | Admitting: Unknown Physician Specialty

## 2019-09-28 ENCOUNTER — Encounter: Payer: Self-pay | Admitting: Surgical

## 2019-09-28 VITALS — HR 46 | Temp 97.7°F | Ht 74.0 in | Wt 232.0 lb

## 2019-09-28 DIAGNOSIS — Z95811 Presence of heart assist device: Secondary | ICD-10-CM

## 2019-09-28 DIAGNOSIS — Z7901 Long term (current) use of anticoagulants: Secondary | ICD-10-CM

## 2019-09-28 DIAGNOSIS — T827XXA Infection and inflammatory reaction due to other cardiac and vascular devices, implants and grafts, initial encounter: Secondary | ICD-10-CM

## 2019-09-28 NOTE — Progress Notes (Signed)
Patient is a 49 year old male here for follow-up after pectoralis turnover flap, placement of ACell powder, placement of wound VAC, excision of skin on 09/05/2019 with Dr. Ulice Bold and Dr. Maren Beach.  He is here with his mother today.  He was last seen here on 09/18/2019, at that time I removed one of his JP drains. Patient reports some tenderness to left chest near origin of pectoralis major muscle.  It is not tender to palpation.  Mostly tender with movement.  Patient is doing well today, he reports JP drain output has been approximately 20 cc every 24 hours.  There is serosanguineous drainage in the bulb.  He has not had any fevers, chills, nausea, vomiting, chest pain, shortness of breath.  He is continuing to have IV antibiotics for 2 more weeks.  He will then transition to p.o. antibiotics.  On exam midline chest incision is healing nicely, he does have a small scab that is healing well.  They have been applying Vaseline to it every day.  JP drain in place along left inferior chest wall, no periinsertional site erythema.  No purulence noted.  No fluctuance noted.  Minimal tenderness to palpation of chest.  LVAD driveline in place along left lower abdomen.  No fluid collections noted.  He has normal range of motion of left arm/shoulder  Recommend applying Vaseline daily to left JP drain insertion site until it has closed. Left JP drain removed Patient can shower from plastic surgery stance Follow-up in 1 month for reevaluation.  Call with any questions or concerns. Pictures were obtained of the patient and placed in the chart with the patient's or guardian's permission.

## 2019-10-03 ENCOUNTER — Ambulatory Visit (HOSPITAL_COMMUNITY): Payer: Self-pay | Admitting: Pharmacist

## 2019-10-03 ENCOUNTER — Other Ambulatory Visit (HOSPITAL_COMMUNITY): Payer: Self-pay | Admitting: Internal Medicine

## 2019-10-03 ENCOUNTER — Other Ambulatory Visit (HOSPITAL_COMMUNITY): Payer: Self-pay | Admitting: Adult Health

## 2019-10-03 ENCOUNTER — Other Ambulatory Visit: Payer: Self-pay

## 2019-10-03 ENCOUNTER — Ambulatory Visit (HOSPITAL_COMMUNITY)
Admission: RE | Admit: 2019-10-03 | Discharge: 2019-10-03 | Disposition: A | Discharge status: Home / Self Care | Payer: Medicare Other | Source: Ambulatory Visit | Attending: Cardiology | Admitting: Cardiology

## 2019-10-03 ENCOUNTER — Encounter (HOSPITAL_COMMUNITY): Payer: Self-pay

## 2019-10-03 VITALS — BP 108/65 | HR 113 | Temp 98.9°F | Wt 224.2 lb

## 2019-10-03 DIAGNOSIS — Z7901 Long term (current) use of anticoagulants: Secondary | ICD-10-CM | POA: Diagnosis not present

## 2019-10-03 DIAGNOSIS — Z95811 Presence of heart assist device: Secondary | ICD-10-CM | POA: Diagnosis present

## 2019-10-03 DIAGNOSIS — T827XXA Infection and inflammatory reaction due to other cardiac and vascular devices, implants and grafts, initial encounter: Secondary | ICD-10-CM

## 2019-10-03 LAB — COMPREHENSIVE METABOLIC PANEL
ALT: 5 U/L (ref 0–44)
AST: 22 U/L (ref 15–41)
Albumin: 2.6 g/dL — ABNORMAL LOW (ref 3.5–5.0)
Alkaline Phosphatase: 110 U/L (ref 38–126)
Anion gap: 13 (ref 5–15)
BUN: 8 mg/dL (ref 6–20)
CO2: 21 mmol/L — ABNORMAL LOW (ref 22–32)
Calcium: 8.2 mg/dL — ABNORMAL LOW (ref 8.9–10.3)
Chloride: 103 mmol/L (ref 98–111)
Creatinine, Ser: 0.94 mg/dL (ref 0.61–1.24)
GFR calc Af Amer: >60 mL/min (ref >60)
GFR calc non Af Amer: >60 mL/min (ref >60)
Glucose, Bld: 176 mg/dL — ABNORMAL HIGH (ref 70–99)
Potassium: 3.1 mmol/L — ABNORMAL LOW (ref 3.5–5.1)
Sodium: 137 mmol/L (ref 135–145)
Total Bilirubin: 1 mg/dL (ref 0.3–1.2)
Total Protein: 7.4 g/dL (ref 6.5–8.1)

## 2019-10-03 LAB — CBC
HCT: 31.7 % — ABNORMAL LOW (ref 39.0–52.0)
Hemoglobin: 9.1 g/dL — ABNORMAL LOW (ref 13.0–17.0)
MCH: 27.7 pg (ref 26.0–34.0)
MCHC: 28.7 g/dL — ABNORMAL LOW (ref 30.0–36.0)
MCV: 96.6 fL (ref 80.0–100.0)
Platelets: 162 10*3/uL (ref 150–400)
RBC: 3.28 MIL/uL — ABNORMAL LOW (ref 4.22–5.81)
RDW: 15.9 % — ABNORMAL HIGH (ref 11.5–15.5)
WBC: 7.5 10*3/uL (ref 4.0–10.5)
nRBC: 0 % (ref 0.0–0.2)

## 2019-10-03 LAB — PROTIME-INR
INR: 1.4 — ABNORMAL HIGH (ref 0.8–1.2)
Prothrombin Time: 16.6 seconds — ABNORMAL HIGH (ref 11.4–15.2)

## 2019-10-03 LAB — LACTATE DEHYDROGENASE: LDH: 260 U/L — ABNORMAL HIGH (ref 98–192)

## 2019-10-03 MED ORDER — ENOXAPARIN SODIUM 40 MG/0.4ML ~~LOC~~ SOLN
40.0000 mg | Freq: Two times a day (BID) | SUBCUTANEOUS | 0 refills | Status: DC
Start: 2019-10-03 — End: 2019-10-22

## 2019-10-03 NOTE — Progress Notes (Addendum)
Patient presents to clinic with is mother today for dressing change.   Pt c/o increased SOB. Reports he slept last night flat in bed with no pillows. Wt up 6 lbs over last 2 weeks. Pt reports he is eating soup "every day" and had [employer] for breakfast this am. Discussed importance of avoiding high salt foods, eating out, daily wts with mother verbalizing understanding of same.   Vital Signs:  Temp: 98.9 Doppler Pressure:  80 Automatc BP: 108/65 (77) HR:  113 SPO2: 100%   Weight: 224.2 lb w/o eqt Last weight: 218.4 lb    VAD interrogation & Equipment Management: Speed: 5600 Flow: 4.9 Power: 4.2w    PI: 3.4   Alarms: several low voltage advisories Events: rare PI  Fixed speed: 5600 Low speed limit: 5300  Exit Site Care: Drive line is being maintained weekly by VAD coordinators. Existing VAD dressing removed and site care performed using sterile technique. Drive line exit site cleaned with Chlora prep applicators x 2, allowed to dry, and Sorbaview dressing with biopatch reapplied. Anchor reapplied. Drive line exit site incorporated, the velour is fully implanted at exit site. No drainage, erythema, tenderness, or foul odor noted.  Pt denies fever or chills. Continue weekly dressing changes. VAD coordinators to perform weekly dressing changes.   Pt asked if VAD Coordinator could look at sternal site. He reports he saw Dr. Lyanne Co PA, [facility], last Friday and dressing was removed along with last drain. Site swollen, firm, no tenderness or drainage. Pt denies any fevers or chills. He is still on IV antibiotics 3 x daily. Reviewed picture with Dr. Maren Beach - no changes at this time.    Sent update to Matt (Dr. Ulice Bold PA).  Return to clinic in 1 week for dressing change, bmp, inr, ldh.   Hessie Diener RN VAD Coordinator Office: [phone] Pager: [phone]

## 2019-10-03 NOTE — Progress Notes (Signed)
LVAD INR 

## 2019-10-03 NOTE — Addendum Note (Signed)
Encounter addended by: Lebron Quam, RN on: 10/03/2019 10:31 AM  Actions taken: Visit diagnoses modified, Order list changed, Diagnosis association updated

## 2019-10-03 NOTE — Addendum Note (Signed)
Encounter addended by: Levonne Spiller, RN on: 10/03/2019 2:22 PM  Actions taken: Vitals modified, Clinical Note Signed

## 2019-10-03 NOTE — Addendum Note (Signed)
Encounter addended by: Levonne Spiller, RN on: 10/03/2019 2:36 PM  Actions taken: Clinical Note Signed

## 2019-10-03 NOTE — Addendum Note (Signed)
Encounter addended by: Levonne Spiller, RN on: 10/03/2019 2:31 PM  Actions taken: Clinical Note Signed

## 2019-10-05 ENCOUNTER — Other Ambulatory Visit (HOSPITAL_COMMUNITY): Payer: Self-pay | Admitting: *Deleted

## 2019-10-05 DIAGNOSIS — Z95811 Presence of heart assist device: Secondary | ICD-10-CM

## 2019-10-05 DIAGNOSIS — Z7901 Long term (current) use of anticoagulants: Secondary | ICD-10-CM

## 2019-10-06 LAB — ACID FAST CULTURE WITH REFLEXED SENSITIVITIES (MYCOBACTERIA): Acid Fast Culture: NEGATIVE

## 2019-10-08 ENCOUNTER — Encounter: Payer: Self-pay | Admitting: Internal Medicine

## 2019-10-09 ENCOUNTER — Other Ambulatory Visit: Payer: Self-pay

## 2019-10-09 ENCOUNTER — Ambulatory Visit (HOSPITAL_COMMUNITY): Payer: Self-pay | Admitting: Pharmacist

## 2019-10-09 ENCOUNTER — Ambulatory Visit (HOSPITAL_COMMUNITY)
Admission: RE | Admit: 2019-10-09 | Discharge: 2019-10-09 | Disposition: A | Discharge status: Home / Self Care | Payer: Medicare Other | Source: Ambulatory Visit | Attending: Internal Medicine | Admitting: Internal Medicine

## 2019-10-09 DIAGNOSIS — Z95811 Presence of heart assist device: Secondary | ICD-10-CM | POA: Insufficient documentation

## 2019-10-09 DIAGNOSIS — Z7901 Long term (current) use of anticoagulants: Secondary | ICD-10-CM | POA: Insufficient documentation

## 2019-10-09 LAB — BASIC METABOLIC PANEL
Anion gap: 11 (ref 5–15)
BUN: 7 mg/dL (ref 6–20)
CO2: 19 mmol/L — ABNORMAL LOW (ref 22–32)
Calcium: 8.9 mg/dL (ref 8.9–10.3)
Chloride: 108 mmol/L (ref 98–111)
Creatinine, Ser: 0.9 mg/dL (ref 0.61–1.24)
GFR calc Af Amer: >60 mL/min (ref >60)
GFR calc non Af Amer: >60 mL/min (ref >60)
Glucose, Bld: 113 mg/dL — ABNORMAL HIGH (ref 70–99)
Potassium: 4.9 mmol/L (ref 3.5–5.1)
Sodium: 138 mmol/L (ref 135–145)

## 2019-10-09 LAB — PROTIME-INR
INR: 1.5 — ABNORMAL HIGH (ref 0.8–1.2)
Prothrombin Time: 17.6 seconds — ABNORMAL HIGH (ref 11.4–15.2)

## 2019-10-09 LAB — LACTATE DEHYDROGENASE: LDH: 490 U/L — ABNORMAL HIGH (ref 98–192)

## 2019-10-09 MED ORDER — WARFARIN SODIUM 5 MG PO TABS: ORAL_TABLET | ORAL | 3 refills | Status: DC

## 2019-10-09 NOTE — Progress Notes (Signed)
Patient presents to clinic with is mother today for dressing change.   Provided patient with neck strap for his controller. Currently carrying controller in his pocket. Explained importance of stabilization of his controller to avoid accidental drive line trauma. He verbalized understanding.   Exit Site Care: Drive line is being maintained weekly by VAD coordinators. Existing VAD dressing removed and site care performed using sterile technique. Drive line exit site cleaned with Chlora prep applicators x 2, allowed to dry, and Sorbaview dressing with biopatch reapplied. Anchor reapplied. Drive line exit site incorporated, the velour is fully implanted at exit site. No drainage, erythema, tenderness, or foul odor noted.  Pt denies fever or chills. Continue weekly dressing changes. VAD coordinators to perform weekly dressing changes.   Pt asked if VAD Coordinator could look at sternal site. Site swollen, firm, no tenderness or drainage. Pt denies any fevers or chills. He is still on IV antibiotics 3 x daily. Dr. Maren Beach to clinic to assess- per him site looks great. No changes today.     Return to clinic in 1 week for dressing change & INR.     Alyce Pagan RN VAD Coordinator  Office: [phone]  24/7 Pager: [phone]

## 2019-10-09 NOTE — Addendum Note (Signed)
Encounter addended by: Bernita Raisin, RN on: 10/09/2019 12:44 PM  Actions taken: Pend clinical note

## 2019-10-09 NOTE — Addendum Note (Signed)
Encounter addended by: Marcy Siren, LCSW on: 10/09/2019 3:59 PM  Actions taken: Clinical Note Signed

## 2019-10-09 NOTE — Progress Notes (Signed)
LVAD INR 

## 2019-10-09 NOTE — Progress Notes (Signed)
CSW met with patient and Mom in the clinic. Patient reports he had his interview with the lawyer for his appeal with [employer] and states "It looks really good". Patient reports he is doing well and denies any other concerns at this time. CSW provided support and will continue to be available as needed. [name], [place], [place]

## 2019-10-09 NOTE — Addendum Note (Signed)
Encounter addended by: Bernita Raisin, RN on: 10/09/2019 2:30 PM  Actions taken: Clinical Note Signed

## 2019-10-10 ENCOUNTER — Other Ambulatory Visit (HOSPITAL_COMMUNITY): Payer: Medicaid Other

## 2019-10-11 ENCOUNTER — Other Ambulatory Visit (HOSPITAL_COMMUNITY): Payer: Self-pay | Admitting: Unknown Physician Specialty

## 2019-10-11 MED ORDER — PANTOPRAZOLE SODIUM 40 MG PO TBEC: 40.0000 mg | DELAYED_RELEASE_TABLET | Freq: Two times a day (BID) | ORAL | 6 refills | Status: DC

## 2019-10-12 ENCOUNTER — Other Ambulatory Visit (HOSPITAL_COMMUNITY): Payer: Self-pay | Admitting: Unknown Physician Specialty

## 2019-10-12 DIAGNOSIS — Z95811 Presence of heart assist device: Secondary | ICD-10-CM

## 2019-10-12 DIAGNOSIS — Z7901 Long term (current) use of anticoagulants: Secondary | ICD-10-CM

## 2019-10-15 ENCOUNTER — Encounter: Payer: Self-pay | Admitting: Nurse Practitioner

## 2019-10-16 ENCOUNTER — Other Ambulatory Visit (HOSPITAL_COMMUNITY): Payer: Self-pay | Admitting: Unknown Physician Specialty

## 2019-10-16 ENCOUNTER — Ambulatory Visit: Payer: Medicaid Other | Admitting: Internal Medicine

## 2019-10-16 MED ORDER — DULOXETINE HCL 60 MG PO CPEP: 60.0000 mg | ORAL_CAPSULE | Freq: Every day | ORAL | 3 refills | Status: DC

## 2019-10-17 ENCOUNTER — Ambulatory Visit (HOSPITAL_COMMUNITY)
Admission: RE | Admit: 2019-10-17 | Discharge: 2019-10-17 | Disposition: A | Discharge status: Home / Self Care | Payer: Medicare Other | Source: Ambulatory Visit | Attending: Cardiology | Admitting: Cardiology

## 2019-10-17 ENCOUNTER — Other Ambulatory Visit: Payer: Self-pay

## 2019-10-17 ENCOUNTER — Ambulatory Visit (HOSPITAL_COMMUNITY): Payer: Self-pay | Admitting: Pharmacist

## 2019-10-17 DIAGNOSIS — Z95811 Presence of heart assist device: Secondary | ICD-10-CM | POA: Insufficient documentation

## 2019-10-17 DIAGNOSIS — Z7901 Long term (current) use of anticoagulants: Secondary | ICD-10-CM | POA: Diagnosis not present

## 2019-10-17 LAB — LACTATE DEHYDROGENASE: LDH: 238 U/L — ABNORMAL HIGH (ref 98–192)

## 2019-10-17 LAB — PROTIME-INR
INR: 2 — ABNORMAL HIGH (ref 0.8–1.2)
Prothrombin Time: 21.7 seconds — ABNORMAL HIGH (ref 11.4–15.2)

## 2019-10-17 LAB — CBC
HCT: 37.3 % — ABNORMAL LOW (ref 39.0–52.0)
Hemoglobin: 11 g/dL — ABNORMAL LOW (ref 13.0–17.0)
MCH: 27 pg (ref 26.0–34.0)
MCHC: 29.5 g/dL — ABNORMAL LOW (ref 30.0–36.0)
MCV: 91.4 fL (ref 80.0–100.0)
Platelets: 224 10*3/uL (ref 150–400)
RBC: 4.08 MIL/uL — ABNORMAL LOW (ref 4.22–5.81)
RDW: 15.5 % (ref 11.5–15.5)
WBC: 7.8 10*3/uL (ref 4.0–10.5)
nRBC: 0 % (ref 0.0–0.2)

## 2019-10-17 LAB — BASIC METABOLIC PANEL
Anion gap: 10 (ref 5–15)
BUN: 5 mg/dL — ABNORMAL LOW (ref 6–20)
CO2: 20 mmol/L — ABNORMAL LOW (ref 22–32)
Calcium: 9.2 mg/dL (ref 8.9–10.3)
Chloride: 107 mmol/L (ref 98–111)
Creatinine, Ser: 0.9 mg/dL (ref 0.61–1.24)
GFR calc Af Amer: >60 mL/min (ref >60)
GFR calc non Af Amer: >60 mL/min (ref >60)
Glucose, Bld: 129 mg/dL — ABNORMAL HIGH (ref 70–99)
Potassium: 3.6 mmol/L (ref 3.5–5.1)
Sodium: 137 mmol/L (ref 135–145)

## 2019-10-17 NOTE — Progress Notes (Signed)
Patient presents to clinic with is mother today for dressing change.   Currently carrying controller in his pocket. Wearing concealed weapon shirt with multiple tears and is visibly soiled. His mother reports she is looking for someone to make him another due to expense of shirt if purchased on line.   Spoke with Lasandra Beech, LCSW re: shirt. She will order him another on line to promote compliance to keep site clean, dry, and free of trauma.   DL site dressing in place and achor intact and accurately applied. Pt reports he has to replace often, because it "comes off". Much praise and encouragement given for better care of DL site.  Exit Site Care: Drive line is being maintained weekly by VAD coordinators. Existing VAD dressing removed and site care performed using sterile technique. Drive line exit site cleaned with Chlora prep applicators x 2, allowed to dry, and Sorbaview dressing with biopatch reapplied. Anchor reapplied. Drive line exit site incorporated, the velour is fully implanted at exit site. No drainage, erythema, tenderness, or foul odor noted.  Pt denies fever or chills. Continue weekly dressing changes. VAD coordinators to perform weekly dressing changes. Provided patient with 6 extra anchors.  Pt asked if VAD Coordinator could look at sternal site. Site swollen, less firm, no tenderness or drainage. Pt denies any fevers or chills. His last dose of IV antibiotics was last week. PICC line dc'd Friday 10/12/19 per [facility] nurse. Dr. Maren Beach to clinic to assess- per him site looks great. No changes today.   Return to clinic next Tuesday for full visit with MD. Pt and mother aware and agree.    Hessie Diener RN VAD Coordinator  Office: [phone]  24/7 Pager: [phone]

## 2019-10-17 NOTE — Progress Notes (Signed)
LVAD INR 

## 2019-10-22 ENCOUNTER — Telehealth (HOSPITAL_COMMUNITY): Payer: Self-pay | Admitting: *Deleted

## 2019-10-22 ENCOUNTER — Encounter: Payer: Self-pay | Admitting: Surgical

## 2019-10-22 ENCOUNTER — Other Ambulatory Visit (HOSPITAL_COMMUNITY): Payer: Self-pay | Admitting: *Deleted

## 2019-10-22 ENCOUNTER — Other Ambulatory Visit: Payer: Self-pay

## 2019-10-22 ENCOUNTER — Ambulatory Visit (HOSPITAL_COMMUNITY)
Admission: RE | Admit: 2019-10-22 | Discharge: 2019-10-22 | Disposition: A | Discharge status: Home / Self Care | Payer: Medicare Other | Source: Ambulatory Visit | Attending: Internal Medicine | Admitting: Internal Medicine

## 2019-10-22 ENCOUNTER — Encounter (HOSPITAL_COMMUNITY): Payer: Self-pay

## 2019-10-22 ENCOUNTER — Encounter (HOSPITAL_COMMUNITY): Payer: Medicaid Other

## 2019-10-22 ENCOUNTER — Ambulatory Visit (INDEPENDENT_AMBULATORY_CARE_PROVIDER_SITE_OTHER): Payer: Medicaid Other | Admitting: Surgical

## 2019-10-22 ENCOUNTER — Ambulatory Visit (HOSPITAL_COMMUNITY): Payer: Self-pay | Admitting: Pharmacist

## 2019-10-22 VITALS — BP 122/79 | HR 107 | Temp 98.4°F | Ht 74.0 in | Wt 213.0 lb

## 2019-10-22 VITALS — HR 104 | Temp 97.3°F | Ht 74.0 in | Wt 232.0 lb

## 2019-10-22 DIAGNOSIS — G4733 Obstructive sleep apnea (adult) (pediatric): Secondary | ICD-10-CM | POA: Insufficient documentation

## 2019-10-22 DIAGNOSIS — Z8249 Family history of ischemic heart disease and other diseases of the circulatory system: Secondary | ICD-10-CM | POA: Insufficient documentation

## 2019-10-22 DIAGNOSIS — T827XXA Infection and inflammatory reaction due to other cardiac and vascular devices, implants and grafts, initial encounter: Secondary | ICD-10-CM

## 2019-10-22 DIAGNOSIS — Z7901 Long term (current) use of anticoagulants: Secondary | ICD-10-CM | POA: Diagnosis not present

## 2019-10-22 DIAGNOSIS — Z79899 Other long term (current) drug therapy: Secondary | ICD-10-CM | POA: Insufficient documentation

## 2019-10-22 DIAGNOSIS — I428 Other cardiomyopathies: Secondary | ICD-10-CM | POA: Diagnosis not present

## 2019-10-22 DIAGNOSIS — I110 Hypertensive heart disease with heart failure: Secondary | ICD-10-CM | POA: Insufficient documentation

## 2019-10-22 DIAGNOSIS — K219 Gastro-esophageal reflux disease without esophagitis: Secondary | ICD-10-CM | POA: Diagnosis not present

## 2019-10-22 DIAGNOSIS — Z95811 Presence of heart assist device: Secondary | ICD-10-CM

## 2019-10-22 DIAGNOSIS — X58XXXA Exposure to other specified factors, initial encounter: Secondary | ICD-10-CM | POA: Insufficient documentation

## 2019-10-22 DIAGNOSIS — R0782 Intercostal pain: Secondary | ICD-10-CM

## 2019-10-22 DIAGNOSIS — Z87891 Personal history of nicotine dependence: Secondary | ICD-10-CM | POA: Insufficient documentation

## 2019-10-22 DIAGNOSIS — T888XXA Other specified complications of surgical and medical care, not elsewhere classified, initial encounter: Secondary | ICD-10-CM

## 2019-10-22 DIAGNOSIS — I5022 Chronic systolic (congestive) heart failure: Secondary | ICD-10-CM | POA: Insufficient documentation

## 2019-10-22 LAB — COMPREHENSIVE METABOLIC PANEL
ALT: 10 U/L (ref 0–44)
AST: 21 U/L (ref 15–41)
Albumin: 3.1 g/dL — ABNORMAL LOW (ref 3.5–5.0)
Alkaline Phosphatase: 154 U/L — ABNORMAL HIGH (ref 38–126)
Anion gap: 11 (ref 5–15)
BUN: 7 mg/dL (ref 6–20)
CO2: 21 mmol/L — ABNORMAL LOW (ref 22–32)
Calcium: 9 mg/dL (ref 8.9–10.3)
Chloride: 106 mmol/L (ref 98–111)
Creatinine, Ser: 0.95 mg/dL (ref 0.61–1.24)
GFR calc Af Amer: >60 mL/min (ref >60)
GFR calc non Af Amer: >60 mL/min (ref >60)
Glucose, Bld: 105 mg/dL — ABNORMAL HIGH (ref 70–99)
Potassium: 3.5 mmol/L (ref 3.5–5.1)
Sodium: 138 mmol/L (ref 135–145)
Total Bilirubin: 0.6 mg/dL (ref 0.3–1.2)
Total Protein: 8.1 g/dL (ref 6.5–8.1)

## 2019-10-22 LAB — CBC
HCT: 37 % — ABNORMAL LOW (ref 39.0–52.0)
Hemoglobin: 11 g/dL — ABNORMAL LOW (ref 13.0–17.0)
MCH: 26.8 pg (ref 26.0–34.0)
MCHC: 29.7 g/dL — ABNORMAL LOW (ref 30.0–36.0)
MCV: 90 fL (ref 80.0–100.0)
Platelets: 193 10*3/uL (ref 150–400)
RBC: 4.11 MIL/uL — ABNORMAL LOW (ref 4.22–5.81)
RDW: 15.3 % (ref 11.5–15.5)
WBC: 6.9 10*3/uL (ref 4.0–10.5)
nRBC: 0 % (ref 0.0–0.2)

## 2019-10-22 LAB — LACTATE DEHYDROGENASE: LDH: 218 U/L — ABNORMAL HIGH (ref 98–192)

## 2019-10-22 LAB — PROTIME-INR
INR: 2.3 — ABNORMAL HIGH (ref 0.8–1.2)
Prothrombin Time: 24.5 seconds — ABNORMAL HIGH (ref 11.4–15.2)

## 2019-10-22 NOTE — Progress Notes (Signed)
Patient is a 49 year old male here for follow-up after pectoralis turnover flap, placement of ACell powder and placement of wound VAC on 09/05/2019 with Dr. Ulice Bold and Dr. Donata Clay.  He is here with his mother today. Patient has a long history of driveline infections and is currently on chronic antibiotics.  Patient was seen in LVAD/cardiology clinic today and was noted to have a fluid collection at the distal aspect of the sternal incision.  He was seen by Dr. [name], CT scan and chest x-ray ordered.  Patient unable to have CT scan today due to Medicaid requiring initial work-up prior.   Patient reports that 2 days ago he noticed some swelling in his lower sternal incision and then noticed drainage yesterday.  Reports the drainage was yellowish.  He is not having any fevers, chills, nausea, vomiting, chest pain, shortness of breath.  He has no other complaints.  He does not endorse any pain or abnormal sensation in this area.  On exam midline sternal incision is intact with a small pinhole opening noted at the distal portion.  I was able to express approximately 20 cc of purulent drainage.  The drainage is not foul.  There is no cellulitic changes.  It is not tender to palpation.  Is not warm to touch.  No incisional dehiscence noted.  The drainage was cultured - await cultures.  I was able to express 20 cc of purulent drainage with digital pressure.  Patient tolerated this well without any complications.  Recommend daily dressing changes or twice daily depending on soil level with Vaseline followed by gauze and Medipore tape.  Patient and mother agreed and understood.    Given patient's clinical presentation, lack of cellulitic changes, lack of symptoms and WBC of 6.9 do not feel as if he needs to receive IV antibiotics at this time.  Discussed with patient that I would further discuss this with the cardiology team and determine a plan. Patient currently being managed by ID - most recent visit  on 09/26/19 - continuing with keflex 500mg  TID.   Await CT results for more information on the extent of fluid collection.

## 2019-10-22 NOTE — Addendum Note (Signed)
Encounter addended by: Levonne Spiller, RN on: 10/22/2019 3:10 PM  Actions taken: Vitals modified, Home Medications modified, Medication List reviewed, Order Reconciliation Section accessed, Clinical Note Signed

## 2019-10-22 NOTE — Progress Notes (Signed)
LVAD INR 

## 2019-10-22 NOTE — Progress Notes (Addendum)
Patient presents for sick visit in VAD clinic today with his mom. Denies any issues with his VAD or alarms.   Pt reported over last few days his sternal area has become swollen and has drainage (pus) coming out of it. He denies any fevers or chills or tenderness around that site. Reports he "feels fine".   Sternal dressing removed and Dr. [name] assessed. CT scan of chest ordered. Medicaid requiring CXR, CBC results, and office note for review prior to authorization. All of these obtained and sent for review.   Pt has not taken am meds this morning.   Vital Signs: Temp: 98.4 Doppler Pressure: 90 Automatc BP: 122/78 (92) HR: 107 SPO2: 99% RA  Weight: 223 lbs w/ eqt Last weight: 218 lbs   VAD Indication: Destination Therapy   VAD interrogation & Equipment Management: Speed: 5600 Flow: 5.0 Power: 4.3w    PI: 3.8  Alarms: several no external power alarms (pt admits to double disconnecting to untangle) Events: 0 - 5 PI events daily  Primary Controller:  Replace back up battery in 16 months Back up controller:   Replace back up battery in   months- did not bring  I reviewed the LVAD parameters from today and compared the results to the patient's prior recorded data.  LVAD interrogation was NEGATIVE for significant power changes, POSITIVE for clinical alarms and STABLE for PI events/speed drops. No programming changes were made and pump is functioning within specified parameters.    Exit Site Care: Drive line is being maintained weekly by VAD Coordinators. Existing VAD dressing removed and site care performed using sterile technique. Drive line exit site cleaned with Chlora prep applicators x 2, allowed to dry, and Sorbaview dressing with biopatch reapplied. Anchor reapplied; covered with two large tegaderm dressings. . Drive line exit site incorporated, the velour is fully implanted at exit site. No drainage, erythema, tenderness, or foul odor noted.  Pt denies fever or chills.  Continue weekly dressing changes.   Sternal wound with dry 4 x 4 over site; changed last night per mother. Able to express purulent yellow drainage from site. Area cleaned using sterile technique and culture obtained. Pt has appt with Dr. Kittie Plater (Plastics) office tomorrow. Called her office and re-scheduled for today at 1:40 pm. Pt verbalized agreement to go today.    Device:none  BP & Labs:  Doppler 90 - reflecting MAP  Hgb 11.0 - No S/S of bleeding. Specifically denies melena/BRBPR or nosebleeds.  LDH 218 - established baseline of 200-300. Denies tea-colored urine. No power elevations noted on interrogation.   Patient Instructions:  1. No medication changes today. 2. See Plastic Surgery today at 1:30 instead of tomorrow.  3. Return to clinic in 1 week for dressing change.  4. Will call you when CT scan is scheduled.    Hessie Diener RN VAD Coordinator  Office: [phone]  24/7 Pager: [phone]

## 2019-10-22 NOTE — Progress Notes (Signed)
VAD Clinic Note   Date:  10/22/2019   ID:  Jonathan Wood, DOB 12-Mar-1971, MRN [id number]  Location: Home  Provider location: [place] [facility] Type of Visit: Established patient  PCP: [name]  Cardiologist:  Arvilla Meres, MD Primary HF: Dr [name]   Chief Complaint: Heart Failure/LVAD  History of Present Illness: Jonathan Wood is a 49 y.o. 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  Admitted 08/21/2018 with volume overload and anemia. Diuresed with IV lasix and transitioned to torsemide 20 mg daily. GI consulted EGD completed and showed gastritis and nonbleeding duodenal ulcers, colonoscopy with 5 polyps removed. Protonix increased to BID, he was started on PO iron, and hydrocortisone suppositories for hemorrhoid treatment. He received a total of 2 uPRBCs. He will stay off ASA for several weeks but can reconsider later.  Discharge weight was 221 pounds.   Admitted 12/7-15/20 for DL infection with overlying cellulitis and volume overload. Treated with IV abx and diuresis.   Recently admitted 08/16/19-09/13/19 for DL infection with large subxiphoid abscess. Underwent several debridements and eventually a pec muscle flap. Wound cx + MSSA. Initially started on ancef and rifabutin but expanded to cefipime for a week then changed to IV ancef and po rifabutin. Now on oral keflex and rifabutin  Presents to clinic with his mother today for unscheduled visit due to swelling at the lower edge of his sternum that has getting worse over past week. Denies fevers or chills. Does have purulent drainage from site. Otherwise feels well. Denies orthopnea or PND. No bleeding, melena or neuro symptoms. No VAD alarms. Taking all meds as prescribed.     VAD Indication: Destination Therapy   VAD interrogation & Equipment Management: Speed: 5600 Flow: 5.0 Power: 4.3 w PI:  3.8  Alarms: no alarms Events: rare (0-5 per day)  Primary Controller: Replace back up battery in 16 months Back up controller: Replace back up battery in months- did not bring back up equipment to clinic today  I reviewed the LVAD parameters from todayand compared the results to the patient's prior recorded data.LVAD interrogation wasNEGATIVE for significant power changes, POSITIVE for clinicalalarms and STABLE for PI events/speed drops. No programming changes were madeand pump is functioning within specified parameters.    Past Medical History:  Diagnosis Date  . Asthma   . CHF (congestive heart failure) (HCC)    a. 09/2016: EF 20-25% with cath showing normal cors  . GERD (gastroesophageal reflux disease)   . History of hiatal hernia   . LVAD (left ventricular assist device) present (HCC)   . OSA on CPAP 09/06/2018   Severe OSA with AHI 68/hr on CPAP at 12cm H2O   Past Surgical History:  Procedure Laterality Date  . APPLICATION OF A-CELL OF CHEST/ABDOMEN N/A 08/24/2019   Procedure: Application Of A-Cell Of Chest/Abdomen;  Surgeon: Kerin Perna, MD;  [facility] OR;  Service: Thoracic;  Laterality: N/A;  . APPLICATION OF A-CELL OF CHEST/ABDOMEN N/A 09/05/2019   Procedure: Application Of A-Cell Of Chest/Abdomen;  Surgeon: Peggye Form, DO;  Location: [facility] OR;  Service: Plastics;  Laterality: N/A;  . APPLICATION OF A-CELL OF CHEST/ABDOMEN  09/03/2019   Procedure: Application Of A-Cell Of Chest/Abdomen;  Surgeon: Kerin Perna, MD;  Location: [facility] OR;  Service: Thoracic;;  . APPLICATION OF WOUND VAC N/A 08/22/2019   Procedure: Debridement, Irrigation and Packing of  Abdominal Incision.;  Surgeon: Ivin Poot, MD;  Location: [place];  Service: Thoracic;  Laterality: N/A;  . APPLICATION OF WOUND VAC N/A 08/24/2019   Procedure: Irrigation and Debridement with WOUND VAC APPLICATION;  Surgeon: Ivin Poot, MD;  Location: [place];  Service: Thoracic;  Laterality: N/A;    . APPLICATION OF WOUND VAC N/A 08/30/2019   Procedure: APPLICATION OF WOUND VAC;  Surgeon: Ivin Poot, MD;  Location: [place];  Service: Thoracic;  Laterality: N/A;  . APPLICATION OF WOUND VAC N/A 09/03/2019   Procedure: WOUND VAC CHANGE;  Surgeon: Ivin Poot, MD;  Location: [place];  Service: Thoracic;  Laterality: N/A;  . BIOPSY  08/23/2018   Procedure: BIOPSY;  Surgeon: Irving Copas., MD;  Location: [place];  Service: Gastroenterology;;  . COLONOSCOPY N/A 08/23/2018   Procedure: COLONOSCOPY;  Surgeon: Irving Copas., MD;  Location: [place];  Service: Gastroenterology;  Laterality: N/A;  . ENTEROSCOPY N/A 08/23/2018   Procedure: ENTEROSCOPY;  Surgeon: Rush Landmark Telford Nab., MD;  Location: [place];  Service: Gastroenterology;  Laterality: N/A;  . HEMOSTASIS CLIP PLACEMENT  08/23/2018   Procedure: HEMOSTASIS CLIP PLACEMENT;  Surgeon: Irving Copas., MD;  Location: [place];  Service: Gastroenterology;;  . IABP INSERTION N/A 09/05/2017   Procedure: IABP INSERTION;  Surgeon: Jolaine Artist, MD;  Location: [place] CV LAB;  Service: Cardiovascular;  Laterality: N/A;  . INSERTION OF IMPLANTABLE LEFT VENTRICULAR ASSIST DEVICE N/A 09/06/2017   Procedure: INSERTION OF IMPLANTABLE LEFT VENTRICULAR ASSIST DEVICE - HM3;  Surgeon: Ivin Poot, MD;  Location: [place];  Service: Open Heart Surgery;  Laterality: N/A;  HM3  . MUSCLE FLAP CLOSURE N/A 09/05/2019   Procedure: MUSCLE FLAP CLOSURE;  Surgeon: Wallace Going, DO;  Location: [place];  Service: Plastics;  Laterality: N/A;  . NASAL FRACTURE SURGERY  1987  . POLYPECTOMY  08/23/2018   Procedure: POLYPECTOMY;  Surgeon: Mansouraty, Telford Nab., MD;  Location: [place];  Service: Gastroenterology;;  . RIGHT HEART CATH N/A 08/30/2017   Procedure: RIGHT HEART CATH;  Surgeon: Jolaine Artist, MD;  Location: [place] CV LAB;  Service: Cardiovascular;  Laterality: N/A;  . RIGHT/LEFT HEART CATH  AND CORONARY ANGIOGRAPHY N/A 10/08/2016   Procedure: Right/Left Heart Cath and Coronary Angiography;  Surgeon: Dixie Dials, MD;  Location: [place] CV LAB;  Service: Cardiovascular;  Laterality: N/A;  . STERNAL WOUND DEBRIDEMENT N/A 08/20/2019   Procedure: DEBRIDEMENT OF LVAD DRIVELINE TUNNEL;  Surgeon: Ivin Poot, MD;  Location: [place];  Service: Thoracic;  Laterality: N/A;  . STERNAL WOUND DEBRIDEMENT N/A 09/05/2019   Procedure: DEBRIDEMENT AND CLOSURE OF ABDOMINAL WOUND;  Surgeon: Wallace Going, DO;  Location: [place];  Service: Plastics;  Laterality: N/A;  . SUBMUCOSAL TATTOO INJECTION  08/23/2018   Procedure: SUBMUCOSAL TATTOO INJECTION;  Surgeon: Irving Copas., MD;  Location: [place];  Service: Gastroenterology;;  . TEE WITHOUT CARDIOVERSION N/A 09/06/2017   Procedure: TRANSESOPHAGEAL ECHOCARDIOGRAM (TEE);  Surgeon: Prescott Gum, Collier Salina, MD;  Location: [place];  Service: Open Heart Surgery;  Laterality: N/A;  . WOUND DEBRIDEMENT N/A 08/30/2019   Procedure: Debridement Abdominal Wound;  Surgeon: Ivin Poot, MD;  Location: [facility] OR;  Service: Thoracic;  Laterality: N/A;     Current Outpatient Medications  Medication Sig Dispense Refill  . cephALEXin (KEFLEX) 500 MG capsule Take 1 capsule (500 mg total) by mouth 3 (three) times daily. 90 capsule 11  . docusate sodium (COLACE) 100 MG  capsule Take 1 capsule (100 mg total) by mouth 3 (three) times daily. 90 capsule 2  . DULoxetine (CYMBALTA) 60 MG capsule Take 1 capsule (60 mg total) by mouth daily. 30 capsule 3  . gabapentin (NEURONTIN) 300 MG capsule TAKE ONE CAPSULE BY MOUTH THREE TIMES DAILY (Patient taking differently: Take 300 mg by mouth 3 (three) times daily. ) 90 capsule 6  . hydrALAZINE (APRESOLINE) 50 MG tablet Take 1 tablet (50 mg total) by mouth 3 (three) times daily. 90 tablet 3  . hydrOXYzine (ATARAX/VISTARIL) 25 MG tablet Take 1 tablet (25 mg total) by mouth at bedtime as needed for anxiety. 30 tablet 0  .  linaclotide (LINZESS) 145 MCG CAPS capsule Take 1 capsule (145 mcg total) by mouth daily before breakfast. (Patient taking differently: Take 145 mcg by mouth every other day. ) 30 capsule 4  . pantoprazole (PROTONIX) 40 MG tablet Take 1 tablet (40 mg total) by mouth 2 (two) times daily. 60 tablet 6  . senna-docusate (SENOKOT-S) 8.6-50 MG tablet Take 1 tablet by mouth 2 (two) times daily. 60 tablet 6  . sildenafil (REVATIO) 20 MG tablet TAKE 1 TABLET BY MOUTH THREE TIMES DAILY 90 tablet 11  . spironolactone (ALDACTONE) 25 MG tablet Take 1 tablet (25 mg total) by mouth daily. 30 tablet 6  . torsemide (DEMADEX) 20 MG tablet Take 1 tablet (20 mg total) by mouth every other day. May take extra if needed 45 tablet 3  . traMADol (ULTRAM) 50 MG tablet Take 2 tablets (100 mg total) by mouth every 6 (six) hours as needed for moderate pain. 40 tablet 0  . warfarin (COUMADIN) 5 MG tablet Take 2.5 mg (0.5 tab) every Monday and 5 mg (1 tab) all other days or as directed by Heart Failure clinic 180 tablet 3  . oxyCODONE 10 MG TABS Take 1 tablet (10 mg total) by mouth every 8 (eight) hours. (Patient not taking: Reported on 10/22/2019) 30 tablet 0  . zinc sulfate 220 (50 Zn) MG capsule Take 1 capsule (220 mg total) by mouth daily. (Patient not taking: Reported on 10/22/2019) 30 capsule 6   No current facility-administered medications for this encounter.    Allergies:   Patient has no known allergies.   Social History:  The patient  reports that he has quit smoking. His smoking use included cigarettes. He has a 12.50 pack-year smoking history. He has never used smokeless tobacco. He reports previous alcohol use. He reports previous drug use.   Family History:  The patient's family history includes Hypertension in his father.   ROS:  Please see the history of present illness.   All other systems are personally reviewed and negative.    Vital Signs: Temp: 98.4 Doppler Pressure: 90 Automatc BP: 122/70 (92) HR:  107 SPO2: 99 % RA  Weight: 213 lbs w/ eqt Last weight: 218.9 lbs    Exam:   General:  NAD.  HEENT: normal  Neck: supple. JVP not elevated.  Carotids 2+ bilat; no bruits. No lymphadenopathy or thryomegaly appreciated. Cor: LVAD hum.  Tennis ball sized tense abscess over substernal area that is tense with pus oozing out Lungs: Clear. Abdomen:  soft, nontender, non-distended. No hepatosplenomegaly. No bruits or masses. Good bowel sounds. Driveline site clean. Anchor in place.  Extremities: no cyanosis, clubbing, rash. Warm no edema  Neuro: alert & oriented x 3. No focal deficits. Moves all 4 without problem    Recent Labs: 04/23/2019: B Natriuretic Peptide 709.6 08/13/2019: TSH 0.209 09/18/2019:  Magnesium 1.5 10/22/2019: ALT 10; BUN 7; Creatinine, Ser 0.95; Hemoglobin 11.0; Platelets 193; Potassium 3.5; Sodium 138    Wt Readings from Last 3 Encounters:  10/03/19 101.7 kg (224 lb 3.2 oz)  09/28/19 105.2 kg (232 lb)  09/26/19 102.1 kg (225 lb)      ASSESSMENT AND PLAN:  1. Recurrent DL infection with subxiphoid abscess - s/p debridement and pec flap in 4/21 - remains on keflex and rifabutin - has recurrent subxiphoid abscess today. No systemic symptoms but unable to tell how deep it is. Will need stat CT chest to gauge extent followed by I&D and probable wound vac.  - Dr. Donata Clay to see alter today after CT done and he is finished in OR.  - Will likely need to restart IV abx.  - May need admit - we discussed that he was not a candidate for pump exchange given severe RV dysfunction if infection getting worse. I also discussed that fact that we were frustrated by the disconnect between what he says he is going to do to take care of himself and the fact that he rarely does it  2. Chronic systolic HF - EF 94% s/p HM-3 LVAD on 09/06/17 - Volume status looks good after in-hospital diuresis - Stable NYHA II-III today - RV severely HK   3. HM3 LVAD 09/06/2017.  - VAD interrogated  personally. Parameters stable. - LDH 218 - INR 2.0 goal 2.0-2.5  - Off ASA with PUD.   4. Essential HTN - MAPs ok today - Had AKI with Entresto in past  5. OSA - sleep study with very severe OSA (AHI 69/hr) - continues to refuse therapy. We have arranged f/u in sleep clinic several times but he has not been complaint  6. H/o GI bleed  - 08/2018 EGD completed and showed gastritis and nonbleeding duodenal ulcers, colonoscopy with 5 polyps removed. Protonix was increased to BID.  - No recent GI bleeding - hgb 11.0 - He will stay off aspirin for now.   8. Tobacco Abuse  - says he isn't smoking currently  9. Elevated bilirubin and low pre-albumin - suspected related to heavy ETOH use and RV failure - now normalized  Total time spent 45 minutes. Over half that time spent discussing above.   Signed, Arvilla Meres, MD  10/22/2019 10:46 AM  [facility] [address] Heart and Vascular [place] [address] [phone] (office) [phone] (fax)

## 2019-10-22 NOTE — Progress Notes (Signed)
CSW met with patient in the clinic as he requested assistance with a gas card. CSW provided card and met briefly with patient and mother. Patient states that he was told he will have a determination from disability within the next 2 weeks. Patient grateful for the support and assistance during this challenging financial time for him. [name], [place], [place]

## 2019-10-22 NOTE — Telephone Encounter (Signed)
Mother called VAD pager to report pt's sternal incision is swollen and is draining pus from bottom of incision. Pt in background and says he "feels fine". Denies any fevers or chills. Pt says he does not feel like he needs to go   Picture shared and sent to Dr. [name] and Dr. Jarrett Ables. Instructed pt to come to clinic in am at 8:30. Mother and patient agreed to same.     Hessie Diener RN, VAD Coordinator [phone]

## 2019-10-22 NOTE — Addendum Note (Signed)
Encounter addended by: Marcy Siren, LCSW on: 10/22/2019 11:28 AM  Actions taken: Clinical Note Signed

## 2019-10-22 NOTE — Addendum Note (Signed)
Encounter addended by: Levonne Spiller, RN on: 10/22/2019 3:24 PM  Actions taken: Clinical Note Signed

## 2019-10-23 ENCOUNTER — Telehealth (HOSPITAL_COMMUNITY): Payer: Self-pay | Admitting: *Deleted

## 2019-10-23 ENCOUNTER — Encounter (HOSPITAL_COMMUNITY): Payer: Medicaid Other

## 2019-10-23 ENCOUNTER — Ambulatory Visit: Payer: Medicaid Other | Admitting: Surgical

## 2019-10-23 NOTE — Telephone Encounter (Signed)
Attempted to call patient and his mother; no answer and unable to leave message on either cell phone.  Called home phone and left message asking them to call VAD office re: CT chest scheduled tomorrow at [facility] at 10:00 am. Pt is to arrive at 09:45 and be NPO for two hours prior to procedure.  Also left message per Dr. Maren Beach asking him to come to VAD Clinic after CT scan so Dr. Maren Beach can examine sternal area and re-pack. Pt has f/u with Plastics Friday of this week for wound care (was seen by them yesterday).  Hessie Diener RN, VAD Coordinator [phone]

## 2019-10-24 ENCOUNTER — Other Ambulatory Visit: Payer: Self-pay

## 2019-10-24 ENCOUNTER — Ambulatory Visit (HOSPITAL_COMMUNITY)
Admission: RE | Admit: 2019-10-24 | Discharge: 2019-10-24 | Disposition: A | Discharge status: Home / Self Care | Payer: Medicare Other | Source: Ambulatory Visit | Attending: Internal Medicine | Admitting: Internal Medicine

## 2019-10-24 DIAGNOSIS — T827XXA Infection and inflammatory reaction due to other cardiac and vascular devices, implants and grafts, initial encounter: Secondary | ICD-10-CM | POA: Diagnosis not present

## 2019-10-24 DIAGNOSIS — R0782 Intercostal pain: Secondary | ICD-10-CM | POA: Diagnosis present

## 2019-10-24 LAB — AEROBIC CULTURE W GRAM STAIN (SUPERFICIAL SPECIMEN): Culture: NO GROWTH

## 2019-10-24 MED ORDER — IOHEXOL 300 MG/ML  SOLN
75.0000 mL | Freq: Once | INTRAMUSCULAR | Status: AC | PRN
  Administered 2019-10-24: 75 mL via INTRAVENOUS

## 2019-10-24 NOTE — Patient Instructions (Signed)
1. Continue daily dressing changes to sternal wound:  1. Remove old dressing  2. Open sterile gloves, drop four 4 x 4 gauze on sterile field.  3. Moisten one gauze with sterile saline  4. Put on sterile gloves  5. Clean wound with dry gauze  6. Place moistened gauze in site  7. Cover with dry gauze and tape. 2. Return for labs and dressing change next Thurs at 9:30

## 2019-10-24 NOTE — Progress Notes (Signed)
Patient presents to clinic with his mother for sternal dressing change per Dr. Donata Clay. Pt completed chest CT this am as requested.   Pt saw Matt at Dr. Kittie Plater (Plastic Surgery) this past Tuesday. Pt reports the sternal wound was drained and re-cultured at that visit. His mother has been changing this dressing daily; covering with vaseline and dry gauze.   Dr. Donata Clay in to assess sternal wound after reviewing chest CT. He performed I & D on superficial subdermal fluid collection - see below.    Exit Site Care: Drive line is being maintained weekly by VAD coordinators. Existing VAD dressing removed and site care performed using sterile technique. Drive line exit site cleaned with Chlora prep applicators x 2, allowed to dry, and Sorbaview dressing with biopatch reapplied. Anchor reapplied. Drive line exit site incorporated, the velour is fully implanted at exit site. No drainage, erythema, tenderness, or foul odor noted.  Pt denies fever or chills. Continue weekly dressing changes. VAD coordinators to perform weekly dressing changes. Provided patient with 6 extra anchors.  Sternal site:  Dressing removed; large soft swollen area noted. Mother says daily dressing changes have had small amount yellow drainage. Using sterile technique, Dr. Donata Clay performed I & D on site with large amount yellow drainage excised; no foul odor noted. Site cleaned with no tunneling noted; wet to dry dressing applied with dry gauze on top. Dr. Donata Clay asked mother to perform daily dressing changes using wet to dry technique as he demonstrated. Written and video instructions shared with mother.      Pt has f/u with Dr. Ulice Bold Friday, but no appointment time noted. Called office, pt will report at 9:00 am. Pt verbalized understanding of same.   Return to clinic next Thursday for labs and dressing change. Pt and mother aware and agree.    Hessie Diener RN VAD Coordinator  Office: [phone]  24/7  Pager: [phone]

## 2019-10-25 ENCOUNTER — Other Ambulatory Visit (HOSPITAL_COMMUNITY): Payer: Self-pay | Admitting: *Deleted

## 2019-10-25 DIAGNOSIS — T827XXA Infection and inflammatory reaction due to other cardiac and vascular devices, implants and grafts, initial encounter: Secondary | ICD-10-CM

## 2019-10-25 DIAGNOSIS — Z95811 Presence of heart assist device: Secondary | ICD-10-CM

## 2019-10-25 DIAGNOSIS — Z7901 Long term (current) use of anticoagulants: Secondary | ICD-10-CM

## 2019-10-26 ENCOUNTER — Ambulatory Visit (INDEPENDENT_AMBULATORY_CARE_PROVIDER_SITE_OTHER): Payer: Medicaid Other | Admitting: Surgical

## 2019-10-26 ENCOUNTER — Other Ambulatory Visit: Payer: Self-pay

## 2019-10-26 ENCOUNTER — Encounter: Payer: Self-pay | Admitting: Surgical

## 2019-10-26 VITALS — HR 90 | Temp 97.5°F

## 2019-10-26 DIAGNOSIS — T827XXA Infection and inflammatory reaction due to other cardiac and vascular devices, implants and grafts, initial encounter: Secondary | ICD-10-CM

## 2019-10-26 DIAGNOSIS — T888XXD Other specified complications of surgical and medical care, not elsewhere classified, subsequent encounter: Secondary | ICD-10-CM

## 2019-10-26 DIAGNOSIS — Z95811 Presence of heart assist device: Secondary | ICD-10-CM

## 2019-10-26 NOTE — Progress Notes (Signed)
   Subjective:     Patient ID: Jonathan Wood, male    DOB: 25-Sep-1970, 49 y.o.   MRN: [id number]  Chief Complaint  Patient presents with  . Follow-up    HPI: The patient is a 49 y.o. male here for follow-up after pectoralis turnover flap, placement of ACell powder and wound VAC on 09/05/2019 with Dr. Ulice Bold and Dr. Donata Clay.  It was recently noted that patient developed a fluid collection at the distal aspect of his sternal incision.  Patient had a CT scan on 10/24/2019.  Patient had incision and drainage by Dr. Donata Clay on 10/24/2019.  Patient has been doing wet-to-dry dressing changes daily with assistance from his mother.  Other than some pain he is doing well.  There is no purulent drainage evident on today's exam.  Dr. Ulice Bold evaluated patient with me during today's visit.  Review of Systems  Constitutional: Negative.   Respiratory: Negative.   Cardiovascular: Negative.      Objective:   Vital Signs Pulse 90   Temp (!) 97.5 F (36.4 C) (Temporal)   SpO2 100%  Vital Signs and Nursing Note Reviewed  Physical Exam Constitutional:      General: He is not in acute distress.    Appearance: Normal appearance. He is not ill-appearing.  Pulmonary:     Effort: Pulmonary effort is normal.  Chest:    Abdominal:     General: Abdomen is flat.  Neurological:     General: No focal deficit present.     Mental Status: He is alert and oriented to person, place, and time. Mental status is at baseline.  Psychiatric:        Mood and Affect: Mood normal.        Behavior: Behavior normal.         Assessment/Plan:     ICD-10-CM   1. Infection associated with driveline of left ventricular assist device (LVAD) (HCC)  T82.7XXA   2. LVAD (left ventricular assist device) present (HCC)  Z95.811   3. Fluid collection at surgical site, subsequent encounter  T88.8XXD      No sign of any infection, no tunneling noted.  Patient feels well other than some pain.  Recommend  Tylenol for pain.  The area was previously cultured, culture still pending but no growth after 3 days.  Donated ACell applied to sternal wound, recommend daily dressing changes with K-Y jelly and 4 x 4 gauze.  Cover with Medipore tape.  Change Adaptic mesh q. 2 to 3 days.  Follow-up in 1 week for reevaluation.  Patient is scheduled to be seen in VAD clinic next week as well.  Provided patient with instructions to assist with dressing changes.  We will order some supplies from [facility] as patient does not have any.  Pictures were obtained of the patient and placed in the chart with the patient's or guardian's permission.  Call with any questions or concerns, follow-up in 1 week  Leslee Home, PA-C 10/26/2019, 9:49 AM

## 2019-10-27 LAB — AEROBIC/ANAEROBIC CULTURE W GRAM STAIN (SURGICAL/DEEP WOUND): Culture: NO GROWTH

## 2019-10-29 ENCOUNTER — Other Ambulatory Visit (HOSPITAL_COMMUNITY): Payer: Medicaid Other

## 2019-10-29 ENCOUNTER — Other Ambulatory Visit (HOSPITAL_COMMUNITY): Payer: Self-pay | Admitting: Adult Health

## 2019-10-29 ENCOUNTER — Telehealth: Payer: Self-pay | Admitting: *Deleted

## 2019-10-29 NOTE — Telephone Encounter (Signed)
Faxed order to [employer] on (10/26/19) for supplies for the patient.  Confirmation received.  Copy scanned into the chart.   Received on (10/29/19) Order Status Notification from [employer]- Stating We have contacted the patient with pricing options.  Their health plan does not cover the requested supplies.  The patient declined to purchase the non-covered items at this time.//AB/CMA

## 2019-10-30 ENCOUNTER — Telehealth (HOSPITAL_COMMUNITY): Payer: Self-pay | Admitting: Pharmacist

## 2019-10-30 NOTE — Telephone Encounter (Signed)
Patient Advocate Encounter   Received notification from [facility] Medicaid that prior authorization for Sildenafil is required for RV dysfunction/PH.   PA submitted on  Tracks Confirmation #: [id number] W Recipient ID: [id number] P Status is pending   Will continue to follow.  [name], PharmD, BCPS, BCCP, CPP Heart Failure Clinic Pharmacist [phone]

## 2019-11-01 ENCOUNTER — Other Ambulatory Visit (HOSPITAL_COMMUNITY): Payer: Medicaid Other

## 2019-11-02 ENCOUNTER — Ambulatory Visit (HOSPITAL_COMMUNITY)
Admission: RE | Admit: 2019-11-02 | Discharge: 2019-11-02 | Disposition: A | Discharge status: Home / Self Care | Payer: Medicare Other | Source: Ambulatory Visit | Attending: Cardiology | Admitting: Cardiology

## 2019-11-02 ENCOUNTER — Ambulatory Visit (HOSPITAL_COMMUNITY): Payer: Self-pay | Admitting: Pharmacist

## 2019-11-02 ENCOUNTER — Ambulatory Visit (INDEPENDENT_AMBULATORY_CARE_PROVIDER_SITE_OTHER): Payer: Medicaid Other | Admitting: Surgical

## 2019-11-02 ENCOUNTER — Other Ambulatory Visit (HOSPITAL_COMMUNITY): Payer: Self-pay | Admitting: *Deleted

## 2019-11-02 ENCOUNTER — Encounter: Payer: Self-pay | Admitting: Surgical

## 2019-11-02 ENCOUNTER — Other Ambulatory Visit: Payer: Self-pay

## 2019-11-02 VITALS — HR 111 | Temp 97.3°F

## 2019-11-02 DIAGNOSIS — Z4801 Encounter for change or removal of surgical wound dressing: Secondary | ICD-10-CM | POA: Insufficient documentation

## 2019-11-02 DIAGNOSIS — Z95811 Presence of heart assist device: Secondary | ICD-10-CM | POA: Insufficient documentation

## 2019-11-02 DIAGNOSIS — T888XXD Other specified complications of surgical and medical care, not elsewhere classified, subsequent encounter: Secondary | ICD-10-CM

## 2019-11-02 DIAGNOSIS — T827XXA Infection and inflammatory reaction due to other cardiac and vascular devices, implants and grafts, initial encounter: Secondary | ICD-10-CM | POA: Diagnosis not present

## 2019-11-02 DIAGNOSIS — Z7901 Long term (current) use of anticoagulants: Secondary | ICD-10-CM

## 2019-11-02 LAB — COMPREHENSIVE METABOLIC PANEL
ALT: 10 U/L (ref 0–44)
AST: 23 U/L (ref 15–41)
Albumin: 3.2 g/dL — ABNORMAL LOW (ref 3.5–5.0)
Alkaline Phosphatase: 155 U/L — ABNORMAL HIGH (ref 38–126)
Anion gap: 12 (ref 5–15)
BUN: 5 mg/dL — ABNORMAL LOW (ref 6–20)
CO2: 22 mmol/L (ref 22–32)
Calcium: 8.1 mg/dL — ABNORMAL LOW (ref 8.9–10.3)
Chloride: 106 mmol/L (ref 98–111)
Creatinine, Ser: 1.03 mg/dL (ref 0.61–1.24)
GFR calc Af Amer: >60 mL/min (ref >60)
GFR calc non Af Amer: >60 mL/min (ref >60)
Glucose, Bld: 127 mg/dL — ABNORMAL HIGH (ref 70–99)
Potassium: 3.3 mmol/L — ABNORMAL LOW (ref 3.5–5.1)
Sodium: 140 mmol/L (ref 135–145)
Total Bilirubin: 0.9 mg/dL (ref 0.3–1.2)
Total Protein: 7.6 g/dL (ref 6.5–8.1)

## 2019-11-02 LAB — PROTIME-INR
INR: 2 — ABNORMAL HIGH (ref 0.8–1.2)
Prothrombin Time: 22 seconds — ABNORMAL HIGH (ref 11.4–15.2)

## 2019-11-02 LAB — CBC
HCT: 36 % — ABNORMAL LOW (ref 39.0–52.0)
Hemoglobin: 10.9 g/dL — ABNORMAL LOW (ref 13.0–17.0)
MCH: 26.5 pg (ref 26.0–34.0)
MCHC: 30.3 g/dL (ref 30.0–36.0)
MCV: 87.6 fL (ref 80.0–100.0)
Platelets: 200 10*3/uL (ref 150–400)
RBC: 4.11 MIL/uL — ABNORMAL LOW (ref 4.22–5.81)
RDW: 16 % — ABNORMAL HIGH (ref 11.5–15.5)
WBC: 8.3 10*3/uL (ref 4.0–10.5)
nRBC: 0 % (ref 0.0–0.2)

## 2019-11-02 LAB — C-REACTIVE PROTEIN: CRP: 3.5 mg/dL — ABNORMAL HIGH (ref <1.0)

## 2019-11-02 LAB — SEDIMENTATION RATE: Sed Rate: 28 mm/hr — ABNORMAL HIGH (ref 0–16)

## 2019-11-02 NOTE — Progress Notes (Signed)
Patient presents to clinic with is mother today for dressing change.   Pt says sternal incision drainage has decreased. Mother has been performing daily dressing changes. Pt has f/u appt with Plastics after this visit.    Exit Site Care: Drive line is being maintained weekly by VAD coordinators. Existing VAD dressing removed and site care performed using sterile technique. Drive line exit site cleaned with Chlora prep applicators x 2, allowed to dry, and Sorbaview dressing with biopatch reapplied. Anchor x 2 re-applied. Drive line exit site incorporated, the velour is fully implanted at exit site. No drainage, erythema, tenderness, or foul odor noted.  Pt denies fever or chills. Continue weekly dressing changes. VAD coordinators to perform weekly dressing changes.   Return to clinic in 1 week for dressing change & INR.    Hessie Diener RN VAD Coordinator  Office: [phone]  24/7 Pager: [phone]

## 2019-11-02 NOTE — Progress Notes (Signed)
LVAD INR 

## 2019-11-02 NOTE — Progress Notes (Signed)
   Subjective:     Patient ID: Jonathan Wood, male    DOB: 01-05-1971, 49 y.o.   MRN: [id number]  Chief Complaint  Patient presents with  . Follow-up    HPI: The patient is a 49 y.o. male here for follow-up on his lower sternal wound.  He reports he is doing better today, drainage has improved, no fevers, chills, nausea, vomiting, chest pain, shortness of breath.  His mother is here with him today, she has been helping him with dressing changes.  They have been applying K-Y jelly daily followed by 4 x 4 gauze.  They have been changing the Adaptic every other day.  Review of Systems  Constitutional: Negative for activity change.  Respiratory: Negative.   Cardiovascular: Negative.   Gastrointestinal: Negative.   Skin: Positive for wound. Negative for color change and rash.     Objective:   Vital Signs Pulse (!) 111   Temp (!) 97.3 F (36.3 C) (Temporal)   SpO2 98%  Vital Signs and Nursing Note Reviewed Chaperone present Physical Exam Constitutional:      General: He is not in acute distress.    Appearance: Normal appearance. He is not ill-appearing.  Pulmonary:     Effort: Pulmonary effort is normal.  Chest:    Abdominal:     General: Abdomen is flat.     Comments: LVAD insertion site noted on left lower abdomen  Neurological:     Mental Status: He is alert.       Assessment/Plan:     ICD-10-CM   1. Infection associated with driveline of left ventricular assist device (LVAD) (HCC)  T82.7XXA   2. Fluid collection at surgical site, subsequent encounter  T88.8XXD    Patient is doing well today, he is pleased with his progress so far.  We reviewed EMR photos together during his evaluation.  Donated ACell applied to lower sternal wound, recommend no showers for 3 days, change dressing in 2 days by applying new and K-Y jelly followed by 4 x 4 gauze.  Change Adaptic every other day.  Call with any questions or concerns.   Discussed with patient and his mother  that I and Dr. Ulice Bold will be out of town next week, but other providers will be available if they have any questions or concerns.  We will follow up in the office in ~ 2 weeks.  Pictures were obtained of the patient and placed in the chart with the patient's or guardian's permission.   Kermit Balo Tamica Covell, PA-C 11/02/2019, 12:39 PM

## 2019-11-06 NOTE — Telephone Encounter (Signed)
Advanced Heart Failure Patient Advocate Encounter  Prior Authorization for Sildenafil has been approved.    PA# [id number] Effective dates: 10/30/2019 - 10/24/2020  Patients co-pay is $3.00  [name], PharmD, BCPS, BCCP, CPP Heart Failure Clinic Pharmacist [phone]

## 2019-11-09 ENCOUNTER — Other Ambulatory Visit: Payer: Self-pay

## 2019-11-09 ENCOUNTER — Ambulatory Visit (HOSPITAL_COMMUNITY)
Admission: RE | Admit: 2019-11-09 | Discharge: 2019-11-09 | Disposition: A | Discharge status: Home / Self Care | Payer: Medicare Other | Source: Ambulatory Visit | Attending: Cardiology | Admitting: Cardiology

## 2019-11-09 ENCOUNTER — Ambulatory Visit (HOSPITAL_COMMUNITY): Payer: Self-pay | Admitting: Pharmacist

## 2019-11-09 DIAGNOSIS — R0602 Shortness of breath: Secondary | ICD-10-CM

## 2019-11-09 DIAGNOSIS — R062 Wheezing: Secondary | ICD-10-CM

## 2019-11-09 DIAGNOSIS — Z95811 Presence of heart assist device: Secondary | ICD-10-CM | POA: Insufficient documentation

## 2019-11-09 DIAGNOSIS — Z7901 Long term (current) use of anticoagulants: Secondary | ICD-10-CM | POA: Diagnosis not present

## 2019-11-09 LAB — PROTIME-INR
INR: 1.4 — ABNORMAL HIGH (ref 0.8–1.2)
Prothrombin Time: 16.5 seconds — ABNORMAL HIGH (ref 11.4–15.2)

## 2019-11-09 MED ORDER — ALBUTEROL SULFATE HFA 108 (90 BASE) MCG/ACT IN AERS: 2.0000 | INHALATION_SPRAY | Freq: Four times a day (QID) | RESPIRATORY_TRACT | 2 refills | Status: DC | PRN

## 2019-11-09 NOTE — Progress Notes (Signed)
Patient presents to clinic with is mother today for dressing change.   Mother has been performing daily dressing changes and reports "no drainage". Pt has f/u appt with Plastics next Thursday and requests VAD dressing change be scheduled on same day.  Pt c/o wheezing and SOB and wants "inhaler and breathing treatment". Reports he has been on these in past for his asthma. He denies any wt gain, lower extremity edema, abdominal fullness. Per Dr. Shirlee Latch, Rx sent for inhaler.  Per Dr. Shirlee Latch, referral sent to [facility] for evaluation and treatment for SOB and wheezing.  Pt c/o "iitching" under VAD dressing and asks "what can I take". OTC Benadryl in an option. Have been rinsing area after [facility] cleansing last few visits, will switch to betadine swabs for cleaning and also rinse with sterile saline, see below.   Exit Site Care: Drive line is being maintained weekly by VAD coordinators.  Existing VAD dressing removed and site care performed using sterile technique. Drive line exit site cleaned with betadine swabs x 2, rinsed with sterile salien, allowed to dry, and Sorbaview dressing with biopatch reapplied. Anchor  re-applied. No skin prep used due to hx of itching. Dressing and achor covered with 2 large Tegaderm dressings. Drive line exit site incorporated, the velour is fully implanted at exit site. No drainage, erythema, tenderness, or foul odor noted.  Pt denies fever or chills. Continue weekly dressing changes. VAD coordinators to perform weekly dressing changes.   Return to clinic in 1 week for dressing change & INR.    Hessie Diener RN VAD Coordinator  Office: [phone]  24/7 Pager: [phone]

## 2019-11-09 NOTE — Addendum Note (Signed)
Encounter addended by: Levonne Spiller, RN on: 11/09/2019 11:57 AM  Actions taken: Visit diagnoses modified, Order list changed, Diagnosis association updated, Clinical Note Signed

## 2019-11-12 ENCOUNTER — Other Ambulatory Visit (HOSPITAL_COMMUNITY): Payer: Self-pay | Admitting: Unknown Physician Specialty

## 2019-11-12 DIAGNOSIS — Z7901 Long term (current) use of anticoagulants: Secondary | ICD-10-CM

## 2019-11-12 DIAGNOSIS — Z95811 Presence of heart assist device: Secondary | ICD-10-CM

## 2019-11-13 ENCOUNTER — Ambulatory Visit (HOSPITAL_COMMUNITY)
Admission: RE | Admit: 2019-11-13 | Discharge: 2019-11-13 | Disposition: A | Discharge status: Home / Self Care | Payer: Medicare Other | Source: Ambulatory Visit | Attending: Internal Medicine | Admitting: Internal Medicine

## 2019-11-13 ENCOUNTER — Other Ambulatory Visit: Payer: Self-pay

## 2019-11-13 ENCOUNTER — Inpatient Hospital Stay (HOSPITAL_COMMUNITY)
Admission: AD | Admit: 2019-11-13 | Discharge: 2019-11-16 | DRG: 315 | Disposition: A | Discharge status: Home / Self Care | Payer: Medicare Other | Source: Ambulatory Visit | Attending: Internal Medicine | Admitting: Internal Medicine

## 2019-11-13 VITALS — BP 109/75 | HR 110 | Temp 98.7°F | Resp 38

## 2019-11-13 DIAGNOSIS — I110 Hypertensive heart disease with heart failure: Secondary | ICD-10-CM | POA: Diagnosis present

## 2019-11-13 DIAGNOSIS — L02213 Cutaneous abscess of chest wall: Secondary | ICD-10-CM | POA: Diagnosis present

## 2019-11-13 DIAGNOSIS — T827XXA Infection and inflammatory reaction due to other cardiac and vascular devices, implants and grafts, initial encounter: Secondary | ICD-10-CM

## 2019-11-13 DIAGNOSIS — R0682 Tachypnea, not elsewhere classified: Secondary | ICD-10-CM | POA: Diagnosis present

## 2019-11-13 DIAGNOSIS — Z8249 Family history of ischemic heart disease and other diseases of the circulatory system: Secondary | ICD-10-CM | POA: Diagnosis not present

## 2019-11-13 DIAGNOSIS — R7989 Other specified abnormal findings of blood chemistry: Secondary | ICD-10-CM | POA: Diagnosis present

## 2019-11-13 DIAGNOSIS — Z792 Long term (current) use of antibiotics: Secondary | ICD-10-CM | POA: Diagnosis not present

## 2019-11-13 DIAGNOSIS — F17200 Nicotine dependence, unspecified, uncomplicated: Secondary | ICD-10-CM | POA: Diagnosis present

## 2019-11-13 DIAGNOSIS — I428 Other cardiomyopathies: Secondary | ICD-10-CM | POA: Diagnosis present

## 2019-11-13 DIAGNOSIS — Z20822 Contact with and (suspected) exposure to covid-19: Secondary | ICD-10-CM | POA: Diagnosis present

## 2019-11-13 DIAGNOSIS — Z7901 Long term (current) use of anticoagulants: Secondary | ICD-10-CM | POA: Diagnosis not present

## 2019-11-13 DIAGNOSIS — E871 Hypo-osmolality and hyponatremia: Secondary | ICD-10-CM | POA: Diagnosis present

## 2019-11-13 DIAGNOSIS — K219 Gastro-esophageal reflux disease without esophagitis: Secondary | ICD-10-CM | POA: Diagnosis present

## 2019-11-13 DIAGNOSIS — G4733 Obstructive sleep apnea (adult) (pediatric): Secondary | ICD-10-CM | POA: Diagnosis present

## 2019-11-13 DIAGNOSIS — D638 Anemia in other chronic diseases classified elsewhere: Secondary | ICD-10-CM | POA: Diagnosis present

## 2019-11-13 DIAGNOSIS — R0603 Acute respiratory distress: Secondary | ICD-10-CM | POA: Diagnosis present

## 2019-11-13 DIAGNOSIS — F419 Anxiety disorder, unspecified: Secondary | ICD-10-CM | POA: Diagnosis present

## 2019-11-13 DIAGNOSIS — Z79899 Other long term (current) drug therapy: Secondary | ICD-10-CM

## 2019-11-13 DIAGNOSIS — F1290 Cannabis use, unspecified, uncomplicated: Secondary | ICD-10-CM | POA: Diagnosis present

## 2019-11-13 DIAGNOSIS — Z95811 Presence of heart assist device: Secondary | ICD-10-CM

## 2019-11-13 DIAGNOSIS — F1010 Alcohol abuse, uncomplicated: Secondary | ICD-10-CM | POA: Diagnosis present

## 2019-11-13 DIAGNOSIS — I5022 Chronic systolic (congestive) heart failure: Secondary | ICD-10-CM | POA: Diagnosis present

## 2019-11-13 DIAGNOSIS — I5082 Biventricular heart failure: Secondary | ICD-10-CM | POA: Diagnosis present

## 2019-11-13 DIAGNOSIS — T888XXS Other specified complications of surgical and medical care, not elsewhere classified, sequela: Secondary | ICD-10-CM

## 2019-11-13 LAB — COMPREHENSIVE METABOLIC PANEL
ALT: 163 U/L — ABNORMAL HIGH (ref 0–44)
AST: 286 U/L — ABNORMAL HIGH (ref 15–41)
Albumin: 3 g/dL — ABNORMAL LOW (ref 3.5–5.0)
Alkaline Phosphatase: 131 U/L — ABNORMAL HIGH (ref 38–126)
Anion gap: 11 (ref 5–15)
BUN: 16 mg/dL (ref 6–20)
CO2: 19 mmol/L — ABNORMAL LOW (ref 22–32)
Calcium: 9 mg/dL (ref 8.9–10.3)
Chloride: 105 mmol/L (ref 98–111)
Creatinine, Ser: 1.14 mg/dL (ref 0.61–1.24)
GFR calc Af Amer: >60 mL/min (ref >60)
GFR calc non Af Amer: >60 mL/min (ref >60)
Glucose, Bld: 172 mg/dL — ABNORMAL HIGH (ref 70–99)
Potassium: 3.7 mmol/L (ref 3.5–5.1)
Sodium: 135 mmol/L (ref 135–145)
Total Bilirubin: 1.9 mg/dL — ABNORMAL HIGH (ref 0.3–1.2)
Total Protein: 7.7 g/dL (ref 6.5–8.1)

## 2019-11-13 LAB — BLOOD GAS, ARTERIAL
Acid-base deficit: 1.3 mmol/L (ref 0.0–2.0)
Bicarbonate: 22.3 mmol/L (ref 20.0–28.0)
Drawn by: 331761
FIO2: 21
O2 Saturation: 92.6 %
Patient temperature: 36.8
pCO2 arterial: 33.5 mmHg (ref 32.0–48.0)
pH, Arterial: 7.437 (ref 7.350–7.450)
pO2, Arterial: 65.7 mmHg — ABNORMAL LOW (ref 83.0–108.0)

## 2019-11-13 LAB — LACTATE DEHYDROGENASE: LDH: 557 U/L — ABNORMAL HIGH (ref 98–192)

## 2019-11-13 LAB — CBC
HCT: 31.4 % — ABNORMAL LOW (ref 39.0–52.0)
Hemoglobin: 9.5 g/dL — ABNORMAL LOW (ref 13.0–17.0)
MCH: 25.8 pg — ABNORMAL LOW (ref 26.0–34.0)
MCHC: 30.3 g/dL (ref 30.0–36.0)
MCV: 85.3 fL (ref 80.0–100.0)
Platelets: 197 10*3/uL (ref 150–400)
RBC: 3.68 MIL/uL — ABNORMAL LOW (ref 4.22–5.81)
RDW: 16.4 % — ABNORMAL HIGH (ref 11.5–15.5)
WBC: 8.9 10*3/uL (ref 4.0–10.5)
nRBC: 0.6 % — ABNORMAL HIGH (ref 0.0–0.2)

## 2019-11-13 LAB — PROTIME-INR
INR: 1.9 — ABNORMAL HIGH (ref 0.8–1.2)
Prothrombin Time: 20.9 seconds — ABNORMAL HIGH (ref 11.4–15.2)

## 2019-11-13 LAB — RAPID URINE DRUG SCREEN, HOSP PERFORMED
Amphetamines: NOT DETECTED
Barbiturates: NOT DETECTED
Benzodiazepines: POSITIVE — AB
Cocaine: NOT DETECTED
Opiates: NOT DETECTED
Tetrahydrocannabinol: POSITIVE — AB

## 2019-11-13 LAB — SARS CORONAVIRUS 2 BY RT PCR (HOSPITAL ORDER, PERFORMED IN ~~LOC~~ HOSPITAL LAB): SARS Coronavirus 2: NEGATIVE

## 2019-11-13 LAB — MRSA PCR SCREENING: MRSA by PCR: NEGATIVE

## 2019-11-13 MED ORDER — SILDENAFIL CITRATE 20 MG PO TABS
20.0000 mg | ORAL_TABLET | Freq: Three times a day (TID) | ORAL | Status: DC
  Administered 2019-11-13 – 2019-11-16 (×9): 20 mg via ORAL
  Filled 2019-11-13 (×9): qty 1

## 2019-11-13 MED ORDER — ORAL CARE MOUTH RINSE
15.0000 mL | Freq: Two times a day (BID) | OROMUCOSAL | Status: DC
  Administered 2019-11-14 – 2019-11-16 (×5): 15 mL via OROMUCOSAL

## 2019-11-13 MED ORDER — ACETAMINOPHEN 325 MG PO TABS: 650.0000 mg | ORAL_TABLET | ORAL | Status: DC | PRN

## 2019-11-13 MED ORDER — ZINC SULFATE 220 (50 ZN) MG PO CAPS
220.0000 mg | ORAL_CAPSULE | Freq: Every day | ORAL | Status: DC
  Administered 2019-11-14 – 2019-11-16 (×3): 220 mg via ORAL
  Filled 2019-11-13 (×3): qty 1

## 2019-11-13 MED ORDER — HYDROXYZINE HCL 25 MG PO TABS
25.0000 mg | ORAL_TABLET | Freq: Every evening | ORAL | Status: DC | PRN
  Administered 2019-11-13 – 2019-11-15 (×2): 25 mg via ORAL
  Filled 2019-11-13 (×2): qty 1

## 2019-11-13 MED ORDER — DOCUSATE SODIUM 100 MG PO CAPS
100.0000 mg | ORAL_CAPSULE | Freq: Three times a day (TID) | ORAL | Status: DC
  Administered 2019-11-14 – 2019-11-16 (×4): 100 mg via ORAL
  Filled 2019-11-13 (×8): qty 1

## 2019-11-13 MED ORDER — TORSEMIDE 20 MG PO TABS
20.0000 mg | ORAL_TABLET | ORAL | Status: DC
  Administered 2019-11-14 – 2019-11-16 (×2): 20 mg via ORAL
  Filled 2019-11-13 (×2): qty 1

## 2019-11-13 MED ORDER — ONDANSETRON HCL 4 MG/2ML IJ SOLN: 4.0000 mg | Freq: Four times a day (QID) | INTRAMUSCULAR | Status: DC | PRN

## 2019-11-13 MED ORDER — PANTOPRAZOLE SODIUM 40 MG PO TBEC
40.0000 mg | DELAYED_RELEASE_TABLET | Freq: Two times a day (BID) | ORAL | Status: DC
  Administered 2019-11-13 – 2019-11-16 (×6): 40 mg via ORAL
  Filled 2019-11-13 (×6): qty 1

## 2019-11-13 MED ORDER — DULOXETINE HCL 60 MG PO CPEP
60.0000 mg | ORAL_CAPSULE | Freq: Every day | ORAL | Status: DC
  Administered 2019-11-14 – 2019-11-16 (×3): 60 mg via ORAL
  Filled 2019-11-13 (×3): qty 1

## 2019-11-13 MED ORDER — ALBUTEROL SULFATE (2.5 MG/3ML) 0.083% IN NEBU
3.0000 mL | INHALATION_SOLUTION | Freq: Four times a day (QID) | RESPIRATORY_TRACT | Status: DC | PRN
  Administered 2019-11-14: 3 mL via RESPIRATORY_TRACT
  Filled 2019-11-13: qty 3

## 2019-11-13 MED ORDER — SPIRONOLACTONE 25 MG PO TABS
25.0000 mg | ORAL_TABLET | Freq: Every day | ORAL | Status: DC
  Administered 2019-11-14 – 2019-11-16 (×3): 25 mg via ORAL
  Filled 2019-11-13 (×3): qty 1

## 2019-11-13 MED ORDER — OXYCODONE HCL 5 MG PO TABS
10.0000 mg | ORAL_TABLET | Freq: Three times a day (TID) | ORAL | Status: DC
  Administered 2019-11-13 – 2019-11-16 (×9): 10 mg via ORAL
  Filled 2019-11-13 (×9): qty 2

## 2019-11-13 MED ORDER — HYDRALAZINE HCL 50 MG PO TABS
50.0000 mg | ORAL_TABLET | Freq: Three times a day (TID) | ORAL | Status: DC
  Administered 2019-11-13 – 2019-11-16 (×9): 50 mg via ORAL
  Filled 2019-11-13 (×9): qty 1

## 2019-11-13 MED ORDER — LINACLOTIDE 145 MCG PO CAPS
145.0000 ug | ORAL_CAPSULE | ORAL | Status: DC
  Filled 2019-11-13: qty 1

## 2019-11-13 MED ORDER — CEPHALEXIN 500 MG PO CAPS
500.0000 mg | ORAL_CAPSULE | Freq: Three times a day (TID) | ORAL | Status: DC
  Administered 2019-11-13 – 2019-11-16 (×9): 500 mg via ORAL
  Filled 2019-11-13 (×10): qty 1

## 2019-11-13 MED ORDER — ALBUTEROL SULFATE HFA 108 (90 BASE) MCG/ACT IN AERS: 2.0000 | INHALATION_SPRAY | Freq: Four times a day (QID) | RESPIRATORY_TRACT | 3 refills | Status: DC | PRN

## 2019-11-13 MED ORDER — GABAPENTIN 300 MG PO CAPS
300.0000 mg | ORAL_CAPSULE | Freq: Three times a day (TID) | ORAL | Status: DC
  Administered 2019-11-13 – 2019-11-16 (×9): 300 mg via ORAL
  Filled 2019-11-13 (×9): qty 1

## 2019-11-13 MED ORDER — RIFABUTIN 150 MG PO CAPS
300.0000 mg | ORAL_CAPSULE | Freq: Every day | ORAL | Status: DC
  Administered 2019-11-13 – 2019-11-15 (×3): 300 mg via ORAL
  Filled 2019-11-13 (×4): qty 2

## 2019-11-13 NOTE — Addendum Note (Signed)
Encounter addended by: Lebron Quam, RN on: 11/13/2019 3:26 PM  Actions taken: Order list changed

## 2019-11-13 NOTE — H&P (Addendum)
.     Advanced Heart Failure VAD History and Physical Note   PCP-Cardiologist: Arvilla Meres, MD   Reason for Admission: Respiratory Distress, Recurrent Subxiphoid Abscess     HPI:    Jonathan Wood is a 49 y.o. 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  Admitted 08/21/2018 with volume overload and anemia. Diuresed with IV lasix and transitioned to torsemide 20 mg daily. GI consulted EGD completed and showed gastritis and nonbleeding duodenal ulcers, colonoscopy with 5 polyps removed. Protonix increased to BID, he was started on PO iron,and hydrocortisone suppositories for hemorrhoid treatment. He received a total of 2 uPRBCs. He will stay off ASA forseveral weeksbut can reconsider later.  Discharge weight was 221 pounds.   Admitted 12/7-15/20 for DL infection with overlying cellulitis and volume overload. Treated with IV abx and diuresis.   Recently admitted 08/16/19-09/13/19 for DL infection with large subxiphoid abscess. Underwent several debridements and eventually a pec muscle flap. Wound cx + MSSA. Initially started on ancef and rifabutin but expanded to cefipime for a week then changed to IV ancef and po rifabutin. Now on oral keflex and rifabutin.  Presents to VAD clinic today w/ SOB and wheezing. Non productive cough. Tachypneic w/ RR in the 40s. AF. O2 sats 98% on RA. BP 104/71. No known sick contacts.  Noted to have recurrent subxiphoid abscess. Abscess I&D performed by Dr. Donata Clay. Being admitted for further w/u and treatment.     LVAD INTERROGATION:  HeartMate II LVAD:  Flow 4.1  liters/min, speed 5400 power 4.0, PI 5.1   Review of Systems: [y] = yes,  = no   General: Weight gain ; Weight loss ; Anorexia ; Fatigue ; Fever ; Chills ; Weakness   Cardiac: Chest pain/pressure ; Resting SOB ; Exertional SOB ; Orthopnea ; Pedal Edema ;  Palpitations ; Syncope ; Presyncope ; Paroxysmal nocturnal dyspnea[ ]   Pulmonary: Cough ; Wheezing[ ] ; Hemoptysis[ ] ; Sputum ; Snoring   GI: Vomiting[ ] ; Dysphagia[ ] ; Melena[ ] ; Hematochezia ; Heartburn[ ] ; Abdominal pain ; Constipation ; Diarrhea ; BRBPR   GU: Hematuria[ ] ; Dysuria ; Nocturia[ ]   Vascular: Pain in legs with walking ; Pain in feet with lying flat ; Non-healing sores ; Stroke ; TIA ; Slurred speech ;  Neuro: Headaches[ ] ; Vertigo[ ] ; Seizures[ ] ; Paresthesias[ ] ;Blurred vision ; Diplopia ; Vision changes   Ortho/Skin: Arthritis ; Joint pain ; Muscle pain ; Joint swelling ; Back Pain ; Rash   Psych: Depression[ ] ; Anxiety[ ]   Heme: Bleeding problems ; Clotting disorders ; Anemia   Endocrine: Diabetes ; Thyroid dysfunction[ ]     Home Medications Prior to Admission medications   Medication Sig Start Date End Date Taking? Authorizing Provider  albuterol (VENTOLIN HFA) 108 (90 Base) MCG/ACT inhaler Inhale 2 puffs into the lungs every 6 (six) hours as needed for wheezing or shortness of breath. 11/09/19   Laurey Morale, MD  cephALEXin (KEFLEX) 500 MG capsule Take 1 capsule (500 mg total) by mouth 3 (three) times daily. 10/15/19   Cliffton Asters, MD  docusate sodium (COLACE) 100 MG capsule Take 1 capsule (100 mg  total) by mouth 3 (three) times daily. 02/20/19   Gleen Ripberger, Bevelyn Buckles, MD  DULoxetine (CYMBALTA) 60 MG capsule Take 1 capsule (60 mg total) by mouth daily. 10/16/19   Jaylah Goodlow, Bevelyn Buckles, MD  gabapentin (NEURONTIN) 300 MG capsule TAKE ONE CAPSULE BY MOUTH THREE TIMES DAILY Patient taking differently: Take 300 mg by mouth 3 (three) times daily.  06/07/19   Terriana Barreras, Bevelyn Buckles, MD  hydrALAZINE (APRESOLINE) 50 MG tablet Take 1 tablet (50 mg total) by mouth 3 (three) times daily. 05/01/19   Robbie Lis M, PA-C  hydrOXYzine (ATARAX/VISTARIL) 25 MG tablet Take 1 tablet (25 mg total) by  mouth at bedtime as needed for anxiety. 09/13/19   Clegg, Amy D, NP  linaclotide (LINZESS) 145 MCG CAPS capsule Take 1 capsule (145 mcg total) by mouth daily before breakfast. Patient taking differently: Take 145 mcg by mouth every other day.  06/11/19   Mansouraty, Netty Starring., MD  oxyCODONE 10 MG TABS Take 1 tablet (10 mg total) by mouth every 8 (eight) hours. 09/13/19   Clegg, Amy D, NP  pantoprazole (PROTONIX) 40 MG tablet Take 1 tablet (40 mg total) by mouth 2 (two) times daily. 10/11/19   Leatha Rohner, Bevelyn Buckles, MD  rifabutin (MYCOBUTIN) 150 MG capsule Take 300 mg by mouth daily.    [provider]  senna-docusate (SENOKOT-S) 8.6-50 MG tablet Take 1 tablet by mouth 2 (two) times daily. 09/13/19   Clegg, Amy D, NP  sildenafil (REVATIO) 20 MG tablet TAKE 1 TABLET BY MOUTH THREE TIMES DAILY 10/04/19   Avyaan Summer, Bevelyn Buckles, MD  spironolactone (ALDACTONE) 25 MG tablet Take 1 tablet (25 mg total) by mouth daily. 09/14/19   Clegg, Amy D, NP  torsemide (DEMADEX) 20 MG tablet Take 1 tablet (20 mg total) by mouth every other day. May take extra if needed 09/14/19   Tonye Becket D, NP  traMADol (ULTRAM) 50 MG tablet Take 2 tablets (100 mg total) by mouth every 6 (six) hours as needed for moderate pain. 09/13/19   Tonye Becket D, NP  warfarin (COUMADIN) 5 MG tablet Take 2.5 mg (0.5 tab) every Monday and 5 mg (1 tab) all other days or as directed by Heart Failure clinic Patient taking differently: Take warfarin 5 mg (1 tab) all days or as directed by Heart Failure clinic 10/09/19   Serayah Yazdani, Bevelyn Buckles, MD  zinc sulfate 220 (50 Zn) MG capsule Take 1 capsule (220 mg total) by mouth daily. 09/14/19   Sherald Hess, NP    Past Medical History: Past Medical History:  Diagnosis Date  . Asthma   . CHF (congestive heart failure) (HCC)    a. 09/2016: EF 20-25% with cath showing normal cors  . GERD (gastroesophageal reflux disease)   . History of hiatal hernia   . LVAD (left ventricular assist device) present (HCC)   .  OSA on CPAP 09/06/2018   Severe OSA with AHI 68/hr on CPAP at 12cm H2O    Past Surgical History: Past Surgical History:  Procedure Laterality Date  . APPLICATION OF A-CELL OF CHEST/ABDOMEN N/A 08/24/2019   Procedure: Application Of A-Cell Of Chest/Abdomen;  Surgeon: Kerin Perna, MD;  Location: [facility] OR;  Service: Thoracic;  Laterality: N/A;  . APPLICATION OF A-CELL OF CHEST/ABDOMEN N/A 09/05/2019   Procedure: Application Of A-Cell Of Chest/Abdomen;  Surgeon: Peggye Form, DO;  Location: [facility] OR;  Service: Plastics;  Laterality: N/A;  . APPLICATION OF A-CELL OF CHEST/ABDOMEN  09/03/2019   Procedure: Application Of A-Cell  Of Chest/Abdomen;  Surgeon: Donata Clay, Theron Arista, MD;  Location: [facility] OR;  Service: Thoracic;;  . APPLICATION OF WOUND VAC N/A 08/22/2019   Procedure: Debridement, Irrigation and Packing of Abdominal Incision.;  Surgeon: Kerin Perna, MD;  Location: [facility] OR;  Service: Thoracic;  Laterality: N/A;  . APPLICATION OF WOUND VAC N/A 08/24/2019   Procedure: Irrigation and Debridement with WOUND VAC APPLICATION;  Surgeon: Kerin Perna, MD;  Location: [facility] OR;  Service: Thoracic;  Laterality: N/A;  . APPLICATION OF WOUND VAC N/A 08/30/2019   Procedure: APPLICATION OF WOUND VAC;  Surgeon: Kerin Perna, MD;  Location: [facility] OR;  Service: Thoracic;  Laterality: N/A;  . APPLICATION OF WOUND VAC N/A 09/03/2019   Procedure: WOUND VAC CHANGE;  Surgeon: Kerin Perna, MD;  Location: [facility] OR;  Service: Thoracic;  Laterality: N/A;  . BIOPSY  08/23/2018   Procedure: BIOPSY;  Surgeon: Lemar Lofty., MD;  Location: [facility] ENDOSCOPY;  Service: Gastroenterology;;  . COLONOSCOPY N/A 08/23/2018   Procedure: COLONOSCOPY;  Surgeon: Lemar Lofty., MD;  Location: [facility] ENDOSCOPY;  Service: Gastroenterology;  Laterality: N/A;  . ENTEROSCOPY N/A 08/23/2018   Procedure: ENTEROSCOPY;  Surgeon: Meridee Score Netty Starring., MD;  Location: [facility] ENDOSCOPY;  Service: Gastroenterology;  Laterality: N/A;  .  HEMOSTASIS CLIP PLACEMENT  08/23/2018   Procedure: HEMOSTASIS CLIP PLACEMENT;  Surgeon: Lemar Lofty., MD;  Location: [facility] ENDOSCOPY;  Service: Gastroenterology;;  . IABP INSERTION N/A 09/05/2017   Procedure: IABP INSERTION;  Surgeon: Dolores Patty, MD;  Location: [facility] INVASIVE CV LAB;  Service: Cardiovascular;  Laterality: N/A;  . INSERTION OF IMPLANTABLE LEFT VENTRICULAR ASSIST DEVICE N/A 09/06/2017   Procedure: INSERTION OF IMPLANTABLE LEFT VENTRICULAR ASSIST DEVICE - HM3;  Surgeon: Kerin Perna, MD;  Location: [facility] OR;  Service: Open Heart Surgery;  Laterality: N/A;  HM3  . MUSCLE FLAP CLOSURE N/A 09/05/2019   Procedure: MUSCLE FLAP CLOSURE;  Surgeon: Peggye Form, DO;  Location: [facility] OR;  Service: Plastics;  Laterality: N/A;  . NASAL FRACTURE SURGERY  1987  . POLYPECTOMY  08/23/2018   Procedure: POLYPECTOMY;  Surgeon: Mansouraty, Netty Starring., MD;  Location: [facility] ENDOSCOPY;  Service: Gastroenterology;;  . RIGHT HEART CATH N/A 08/30/2017   Procedure: RIGHT HEART CATH;  Surgeon: Dolores Patty, MD;  Location: [facility] INVASIVE CV LAB;  Service: Cardiovascular;  Laterality: N/A;  . RIGHT/LEFT HEART CATH AND CORONARY ANGIOGRAPHY N/A 10/08/2016   Procedure: Right/Left Heart Cath and Coronary Angiography;  Surgeon: Orpah Cobb, MD;  Location: [facility] INVASIVE CV LAB;  Service: Cardiovascular;  Laterality: N/A;  . STERNAL WOUND DEBRIDEMENT N/A 08/20/2019   Procedure: DEBRIDEMENT OF LVAD DRIVELINE TUNNEL;  Surgeon: Kerin Perna, MD;  Location: [facility] OR;  Service: Thoracic;  Laterality: N/A;  . STERNAL WOUND DEBRIDEMENT N/A 09/05/2019   Procedure: DEBRIDEMENT AND CLOSURE OF ABDOMINAL WOUND;  Surgeon: Peggye Form, DO;  Location: [facility] OR;  Service: Plastics;  Laterality: N/A;  . SUBMUCOSAL TATTOO INJECTION  08/23/2018   Procedure: SUBMUCOSAL TATTOO INJECTION;  Surgeon: Lemar Lofty., MD;  Location: [facility] ENDOSCOPY;  Service: Gastroenterology;;  . TEE WITHOUT CARDIOVERSION N/A 09/06/2017     Procedure: TRANSESOPHAGEAL ECHOCARDIOGRAM (TEE);  Surgeon: Donata Clay, Theron Arista, MD;  Location: [facility] OR;  Service: Open Heart Surgery;  Laterality: N/A;  . WOUND DEBRIDEMENT N/A 08/30/2019   Procedure: Debridement Abdominal Wound;  Surgeon: Kerin Perna, MD;  Location: [facility] OR;  Service: Thoracic;  Laterality: N/A;    Family History: Family History  Problem Relation  Age of Onset  . Hypertension Father   . Colon cancer Neg Hx   . Esophageal cancer Neg Hx   . Inflammatory bowel disease Neg Hx   . Liver disease Neg Hx   . Pancreatic cancer Neg Hx   . Rectal cancer Neg Hx   . Stomach cancer Neg Hx   . Diabetes Neg Hx     Social History: Social History   Socioeconomic History  . Marital status: Legally Separated    Spouse name: Not on file  . Number of children: 3  . Years of education: Not on file  . Highest education level: Not on file  Occupational History  . Not on file  Tobacco Use  . Smoking status: Former Smoker    Packs/day: 0.50    Years: 25.00    Pack years: 12.50    Types: Cigarettes  . Smokeless tobacco: Never Used  . Tobacco comment: quit 08-16-2019  Vaping Use  . Vaping Use: Never used  Substance and Sexual Activity  . Alcohol use: Not Currently    Comment: occas  . Drug use: Not Currently  . Sexual activity: Yes  Other Topics Concern  . Not on file  Social History Narrative   Lives at home with his three children (8, 34, and 40 years of age).    Social Determinants of Health   Financial Resource Strain:   . Difficulty of Paying Living Expenses:   Food Insecurity:   . Worried About Programme researcher, broadcasting/film/video in the Last Year:   . Barista in the Last Year:   Transportation Needs:   . Freight forwarder (Medical):   Marland Kitchen Lack of Transportation (Non-Medical):   Physical Activity:   . Days of Exercise per Week:   . Minutes of Exercise per Session:   Stress:   . Feeling of Stress :   Social Connections:   . Frequency of Communication with Friends  and Family:   . Frequency of Social Gatherings with Friends and Family:   . Attends Religious Services:   . Active Member of Clubs or Organizations:   . Attends Banker Meetings:   Marland Kitchen Marital Status:     Allergies:  No Known Allergies  Objective:    Vital Signs:   BP: 109/15 (90)  HR 110 bpm  O2 Sats 98.7% RA RR 38   Mean arterial Pressure 90   Physical Exam    General:  Fatigue appearing. No resp difficulty HEENT: Normal Neck: supple. no JVP. Carotids 2+ bilat; no bruits. No lymphadenopathy or thyromegaly appreciated. Cor: Mechanical heart sounds with LVAD hum present. + nickel sized subxiphoid access.  Lungs: Clear Abdomen: soft, nontender, nondistended. No hepatosplenomegaly. No bruits or masses. Good bowel sounds. Driveline: C/D/I; securement device intact and driveline incorporated Extremities: no cyanosis, clubbing, rash, edema Neuro: alert & orientedx3, cranial nerves grossly intact. moves all 4 extremities w/o difficulty. Affect pleasant   Telemetry   N/A  EKG   Pending   Labs    Basic Metabolic Panel: Recent Labs  Lab 11/13/19 1108  NA 135  K 3.7  CL 105  CO2 19*  GLUCOSE 172*  BUN 16  CREATININE 1.14  CALCIUM 9.0    Liver Function Tests: Recent Labs  Lab 11/13/19 1108  AST 286*  ALT 163*  ALKPHOS 131*  BILITOT 1.9*  PROT 7.7  ALBUMIN 3.0*   No results for input(s): LIPASE, AMYLASE in the last 168 hours. No results for input(s):  AMMONIA in the last 168 hours.  CBC: Recent Labs  Lab 11/13/19 1108  WBC 8.9  HGB 9.5*  HCT 31.4*  MCV 85.3  PLT 197    Cardiac Enzymes: No results for input(s): CKTOTAL, CKMB, CKMBINDEX, TROPONINI in the last 168 hours.  BNP: BNP (last 3 results) Recent Labs    03/21/19 1415 04/23/19 1350  BNP 426.9* 709.6*    ProBNP (last 3 results) No results for input(s): PROBNP in the last 8760 hours.   CBG: No results for input(s): GLUCAP in the last 168 hours.  Coagulation  Studies: Recent Labs    11/13/19 1108  LABPROT 20.9*  INR 1.9*    Other results: CT of A&P pending    Imaging    DG Chest 2 View  Result Date: 11/13/2019 CLINICAL DATA:  Tachypnea. 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 EXAM: CHEST - 2 VIEW COMPARISON:  10/22/2019 FINDINGS: LVAD device in place. Previous median sternotomy. Cardiac enlargement, stable. Pulmonary vascular congestion and small left pleural effusion unchanged. No airspace densities. IMPRESSION: Cardiac enlargement, pulmonary vascular congestion and small left pleural effusion. Electronically Signed   By: Signa Kell M.D.   On: 11/13/2019 11:45      Assessment/Plan:    1. Respiratory Distress - complaining of increased dyspnea + nonproductive cough - tachypneic w/ RR in the 40s. O2 sats 98% on RA - obtain CXR, CBC and ABG - COVID Screen - UDS   2. H/O Recurrent DL infection with subxiphoid abscess - s/p debridement and pec flap in 4/21 - remains on keflex and rifabutin - has recurrent subxiphoid abscess s/p I&D by Dr. Donata Clay in clinic today - tachypneic w/ RR ~40. Concerns for pulmonary vs systemic infection - plan direct admit. Check CBC, Blood cultures + Urine cultures  - IV antibiotics   - repeat CT of chest and abdomen +CXR  - He is not a candidate for pump exchange given severe RV dysfunction if infection getting worse.   3.Chronic systolic HF - EF 16% s/p HM-3 LVAD on 09/06/17 - appears grossly euvolemic  - NYHA Class III  - RV severely HK   4. HM3 LVAD 09/06/2017. - VAD interrogated personally. Parameters stable. - check LDH - Check INR. Goal 2.0-2.5  - Off ASA with PUD.   5. Essential HTN - MAPs mildly elevated at 90 - continue home meds on admit, monitor  - Had AKI with Entresto in past  6. OSA - sleep study with very severe OSA (AHI 69/hr) - continues to refuse therapy. We have arranged f/u in sleep clinic several times but he has not been  complaint  7. H/o GI bleed  - 08/2018 EGD completed and showed gastritis and nonbleeding duodenal ulcers, colonoscopy with 5 polyps removed. Protonix was increased to BID.  - No recent GI bleeding - check CBC on admit  - He will stay off aspirin for now.   8. Tobacco Abuse  - active smoker  - advised to quit     I reviewed the LVAD parameters from today, and compared the results to the patient's prior recorded data.  No programming changes were made.  The LVAD is functioning within specified parameters.  The patient performs LVAD self-test daily.  LVAD interrogation was negative for any significant power changes, alarms or PI events/speed drops.  LVAD equipment check completed and is in good working order.  Back-up equipment present.   LVAD education done on emergency procedures and precautions and reviewed exit site  care.  Length of Stay: 0  Knute Neu 11/13/2019, 12:46 PM  VAD Team Pager [phone] (7am - 7am) +++VAD ISSUES ONLY+++   Advanced Heart Failure Team Pager [phone] (M-F; 7a - 4p)  Please contact [facility] for night-coverage after hours (4p -7a ) and weekends on [email or web address] for all non- LVAD Issues  Patient seen and examined with the above-signed Advanced Practice Provider and/or Housestaff. I personally reviewed laboratory data, imaging studies and relevant notes. I independently examined the patient and formulated the important aspects of the plan. I have edited the note to reflect any of my changes or salient points. I have personally discussed the plan with the patient and/or family.  Patient presented to VAD Clinic for routine f/u. Found to have recurrent subcutaneous chest abscess and marked tachypnea of unclear etiology. Abscess open in clinic by DR. Donata Clay. Felt to be superficial.. Denies fevers or chills. Feels volume status is good. No leukocytosis. CXR with small left effusion but no frank edema or infiltrate.  General:  Lying flat in bed  NAD HEENT: normal  Neck: supple. JVP not elevated.  Carotids 2+ bilat; no bruits. No lymphadenopathy or thryomegaly appreciated. Cor: LVAD hum.  Small wound that is packed no surrounding erythema Lungs: Clear. Abdomen: obese soft, nontender, non-distended. No hepatosplenomegaly. No bruits or masses. Good bowel sounds. Driveline site clean. Anchor in place.  Extremities: no cyanosis, clubbing, rash. Warm no edema  Neuro: alert & oriented x 3. No focal deficits. Moves all 4 without problem   Etiology of tachypnea unclear. Infection? Anxiety? LDH also elevated. Will get chest CT to evaluate for deeper abscess and also look at lung fields. VAD interrogated personally. Parameters stable. Check bcx as well.   Arvilla Meres, MD  3:18 PM

## 2019-11-13 NOTE — Progress Notes (Signed)
Pt has CPAP at home and don't wear.  He is willing to wear one tonight to help with his OSA.  Placed patient on full face mask 8.5 CMH20 with 4 lpm bled in oxygen.

## 2019-11-13 NOTE — Plan of Care (Signed)

## 2019-11-13 NOTE — Plan of Care (Signed)
Care Plan initiated   Problem: Education: Goal: Knowledge of General Education information will improve Description: Including pain rating scale, medication(s)/side effects and non-pharmacologic comfort measures Outcome: Progressing   Problem: Health Behavior/Discharge Planning: Goal: Ability to manage health-related needs will improve Outcome: Progressing   Problem: Clinical Measurements: Goal: Ability to maintain clinical measurements within normal limits will improve Outcome: Progressing Goal: Will remain free from infection Outcome: Progressing Goal: Diagnostic test results will improve Outcome: Progressing Goal: Respiratory complications will improve Outcome: Progressing Goal: Cardiovascular complication will be avoided Outcome: Progressing   Problem: Activity: Goal: Risk for activity intolerance will decrease Outcome: Progressing   Problem: Nutrition: Goal: Adequate nutrition will be maintained Outcome: Progressing   Problem: Coping: Goal: Level of anxiety will decrease Outcome: Progressing   Problem: Elimination: Goal: Will not experience complications related to bowel motility Outcome: Progressing Goal: Will not experience complications related to urinary retention Outcome: Progressing   Problem: Pain Managment: Goal: General experience of comfort will improve Outcome: Progressing   Problem: Safety: Goal: Ability to remain free from injury will improve Outcome: Progressing   Problem: Skin Integrity: Goal: Risk for impaired skin integrity will decrease Outcome: Progressing   Problem: Education: Goal: Ability to demonstrate management of disease process will improve Outcome: Progressing Goal: Ability to verbalize understanding of medication therapies will improve Outcome: Progressing Goal: Individualized Educational Video(s) Outcome: Progressing   Problem: Activity: Goal: Capacity to carry out activities will improve Outcome: Progressing    Problem: Cardiac: Goal: Ability to achieve and maintain adequate cardiopulmonary perfusion will improve Outcome: Progressing   

## 2019-11-13 NOTE — Progress Notes (Signed)
Patient presents to clinic with is mother today for dressing change.   Pt presents in a wheelchair tachypniec in the 30-40s wheezing and SOB with conversation. Pt is acting slightly off today but states he is fine. He would just like to have "breathing treatments at home". He denies any wt gain, lower extremity edema, abdominal fullness. Per Dr. Shirlee Latch, Rx sent for inhaler.  Per Dr. Shirlee Latch, pt was referred to [facility] for evaluation and treatment for SOB and wheezing last week. Pt has not heard from their office yet.  Pt has a new abscess on his chest today. This area was opened and drained by Dr. Donata Clay in clinic today. See below.  Vital Signs: Temp: 98.7 PO BP:109/75 (90) HR: 110 RR: 38  Chest xray:  Cardiac enlargement, pulmonary vascular congestion and small left pleural effusion.  UDS + for THC and Benzos  Sternal Wound:      Exit Site Care: Drive line is being maintained weekly by VAD coordinators.  Existing VAD dressing removed and site care performed using sterile technique. Drive line exit site cleaned with CHG swabs x 2, rinsed with sterile saline, allowed to dry, and Sorbaview dressing with biopatch reapplied. Anchor  re-applied. No skin prep used due to hx of itching.  Drive line exit site incorporated, the velour is fully implanted at exit site. No drainage, erythema, tenderness, or foul odor noted.  Pt denies fever or chills. Continue weekly dressing changes by VAD coordinator or bedside nurse. Next dressing change due 11/20/19.    Admit pt for respiratory distress and wound care. Report given to Rosalita Chessman, RN on 2C.  Carlton Adam RN VAD Coordinator  Office: [phone]  24/7 Pager: [phone]

## 2019-11-14 ENCOUNTER — Inpatient Hospital Stay (HOSPITAL_COMMUNITY): Payer: Medicare Other

## 2019-11-14 ENCOUNTER — Encounter (HOSPITAL_COMMUNITY): Payer: Self-pay | Admitting: Internal Medicine

## 2019-11-14 ENCOUNTER — Other Ambulatory Visit (HOSPITAL_COMMUNITY): Payer: Medicaid Other

## 2019-11-14 LAB — URINE CULTURE: Culture: NO GROWTH

## 2019-11-14 LAB — BASIC METABOLIC PANEL
Anion gap: 9 (ref 5–15)
BUN: 13 mg/dL (ref 6–20)
CO2: 20 mmol/L — ABNORMAL LOW (ref 22–32)
Calcium: 8.5 mg/dL — ABNORMAL LOW (ref 8.9–10.3)
Chloride: 107 mmol/L (ref 98–111)
Creatinine, Ser: 0.87 mg/dL (ref 0.61–1.24)
GFR calc Af Amer: >60 mL/min (ref >60)
GFR calc non Af Amer: >60 mL/min (ref >60)
Glucose, Bld: 105 mg/dL — ABNORMAL HIGH (ref 70–99)
Potassium: 3.8 mmol/L (ref 3.5–5.1)
Sodium: 136 mmol/L (ref 135–145)

## 2019-11-14 LAB — CBC
HCT: 30.3 % — ABNORMAL LOW (ref 39.0–52.0)
Hemoglobin: 9.1 g/dL — ABNORMAL LOW (ref 13.0–17.0)
MCH: 25.5 pg — ABNORMAL LOW (ref 26.0–34.0)
MCHC: 30 g/dL (ref 30.0–36.0)
MCV: 84.9 fL (ref 80.0–100.0)
Platelets: 192 10*3/uL (ref 150–400)
RBC: 3.57 MIL/uL — ABNORMAL LOW (ref 4.22–5.81)
RDW: 16.5 % — ABNORMAL HIGH (ref 11.5–15.5)
WBC: 7.1 10*3/uL (ref 4.0–10.5)
nRBC: 0.4 % — ABNORMAL HIGH (ref 0.0–0.2)

## 2019-11-14 LAB — LACTATE DEHYDROGENASE: LDH: 388 U/L — ABNORMAL HIGH (ref 98–192)

## 2019-11-14 LAB — PROTIME-INR
INR: 2 — ABNORMAL HIGH (ref 0.8–1.2)
Prothrombin Time: 22.1 seconds — ABNORMAL HIGH (ref 11.4–15.2)

## 2019-11-14 MED ORDER — IOHEXOL 9 MG/ML PO SOLN
ORAL | Status: AC
  Filled 2019-11-14: qty 500

## 2019-11-14 MED ORDER — IOHEXOL 300 MG/ML  SOLN
75.0000 mL | Freq: Once | INTRAMUSCULAR | Status: AC | PRN
  Administered 2019-11-14: 75 mL via INTRAVENOUS

## 2019-11-14 NOTE — Progress Notes (Signed)
Noted with expiratory wheezing, albuterol  Neb given with relief after 30 min. Continue to monitor.

## 2019-11-14 NOTE — Progress Notes (Addendum)
Advanced Heart Failure VAD Team Note  PCP-Cardiologist: Jonathan Meres, MD   Subjective:   Admitted with tachypnea + infection.   Frustrated wants to home. Says he is trying to do everything right.    LVAD INTERROGATION:  HeartMate III LVAD:   Flow 4.7  liters/min, speed 5600, power 4, PI 2.7   Objective:    Vital Signs:   Temp:  [97.3 F (36.3 C)-98.3 F (36.8 C)] 97.7 F (36.5 C) (06/30 1208) Pulse Rate:  [89-110] 89 (06/30 1208) Resp:  [19-25] 19 (06/30 0440) BP: (85-130)/(43-102) 111/86 (06/30 0440) SpO2:  [93 %-99 %] 95 % (06/30 1208) Weight:  [95.3 kg-98.4 kg] 95.3 kg (06/30 0440) Last BM Date: 11/13/19 Mean arterial Pressure 80  Intake/Output:   Intake/Output Summary (Last 24 hours) at 11/14/2019 1235 Last data filed at 11/14/2019 1324 Gross per 24 hour  Intake 360 ml  Output --  Net 360 ml     Physical Exam    General: No resp difficulty HEENT: normal Neck: supple. JVP 5-6 . Carotids 2+ bilat; no bruits. No lymphadenopathy or thyromegaly appreciated. Cor: Mechanical heart sounds with LVAD hum present. Sternal wound with intact dressing.  Lungs: clear Abdomen: soft, nontender, nondistended. No hepatosplenomegaly. No bruits or masses. Good bowel sounds. Driveline: C/D/I; securement device intact and driveline incorporated Extremities: no cyanosis, clubbing, rash, edema Neuro: alert & orientedx3, cranial nerves grossly intact. moves all 4 extremities w/o difficulty. Affect pleasant   Telemetry  SR 90s   EKG    n/a  Labs   Basic Metabolic Panel: Recent Labs  Lab 11/13/19 1108 11/14/19 0216  NA 135 136  K 3.7 3.8  CL 105 107  CO2 19* 20*  GLUCOSE 172* 105*  BUN 16 13  CREATININE 1.14 0.87  CALCIUM 9.0 8.5*    Liver Function Tests: Recent Labs  Lab 11/13/19 1108  AST 286*  ALT 163*  ALKPHOS 131*  BILITOT 1.9*  PROT 7.7  ALBUMIN 3.0*   No results for input(s): LIPASE, AMYLASE in the last 168 hours. No results for input(s):  AMMONIA in the last 168 hours.  CBC: Recent Labs  Lab 11/13/19 1108 11/14/19 0216  WBC 8.9 7.1  HGB 9.5* 9.1*  HCT 31.4* 30.3*  MCV 85.3 84.9  PLT 197 192    INR: Recent Labs  Lab 11/09/19 1106 11/13/19 1108 11/14/19 0216  INR 1.4* 1.9* 2.0*    Other results:  EKG:    Imaging   DG Chest 2 View  Result Date: 11/13/2019 CLINICAL DATA:  Tachypnea. 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 EXAM: CHEST - 2 VIEW COMPARISON:  10/22/2019 FINDINGS: LVAD device in place. Previous median sternotomy. Cardiac enlargement, stable. Pulmonary vascular congestion and small left pleural effusion unchanged. No airspace densities. IMPRESSION: Cardiac enlargement, pulmonary vascular congestion and small left pleural effusion. Electronically Signed   By: Jonathan Wood M.D.   On: 11/13/2019 11:45   CT CHEST W CONTRAST  Result Date: 11/14/2019 CLINICAL DATA:  Soft tissue infection suspected, drainage at LVAD wire insertion site, assess for abscess EXAM: CT CHEST AND ABDOMEN WITH CONTRAST TECHNIQUE: Multidetector CT imaging of the chest and abdomen was performed following the standard protocol during bolus administration of intravenous contrast. CONTRAST:  38mL OMNIPAQUE IOHEXOL 300 MG/ML  SOLN COMPARISON:  08/16/2019 FINDINGS: CT CHEST FINDINGS Cardiovascular: Cardiomegaly. Status post median sternotomy and LVAD placement. No pericardial effusion. Mediastinum/Nodes: Prominent, nonspecific mediastinal lymph nodes. Thyroid gland, trachea, and esophagus demonstrate no significant findings. Lungs/Pleura: Paraseptal emphysema.  Jonathan Wood  vascular findings are present. No enlarged abdominal lymph nodes. Other: There is a small, elongated fluid collection just inferior to the drive line in the superficial soft tissues of the left hemiabdomen, measuring approximately 7.8 cm in length and no greater than 9 mm in thickness (series 3, image 79, series 7, image 133). No abdominopelvic ascites. Musculoskeletal: No acute or significant osseous findings. IMPRESSION: 1. There is an elongated subcutaneous fluid collection overlying median sternotomy wound, measuring 13.7 x 10.1 x 2.6 cm. 2. Smaller rounded fluid collection overlying the left pectoralis major measuring 2.9 cm, likely related to prior pacer placement. 3. There is a small, elongated fluid collection just inferior to the drive line in the superficial soft tissues of the left hemiabdomen, measuring approximately 7.8 cm in length and no greater than 9 mm in thickness. 4. The presence or absence of infection in any of the above described fluid collections is not established by CT. 5. Cardiomegaly with diffuse bilateral interlobular septal thickening and small bilateral pleural effusions, consistent with pulmonary edema. 6. Emphysema (ICD10-J43.9). 7. Hepatic steatosis. Electronically Signed   By: Jonathan Wood M.D.   On: 11/14/2019 11:47      Medications:     Scheduled Medications:  cephALEXin  500 mg Oral TID   docusate sodium  100 mg Oral TID   DULoxetine  60 mg Oral Daily   gabapentin  300 mg Oral TID   hydrALAZINE  50 mg Oral TID   linaclotide  145 mcg Oral QODAY   mouth rinse  15 mL Mouth Rinse BID   oxyCODONE  10 mg Oral Q8H   pantoprazole  40 mg Oral BID   rifabutin  300 mg Oral Daily   sildenafil  20 mg Oral TID   spironolactone  25 mg Oral Daily   torsemide  20 mg Oral QODAY   zinc sulfate  220 mg Oral Daily     Infusions:   PRN  Medications:  acetaminophen, albuterol, hydrOXYzine, ondansetron (ZOFRAN) IV   Patient Profile  Jonathan Wood a 49 y.o.malewith a history of 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   Admitted VAD clinic with recurrent infection.    Assessment/Plan:    1. Respiratory Distress - On admit tachypneic w/ RR in the 40s. O2 sats 98% on RA  - SARS2 negative.  - UDS + benzo + tetrahydrocannabinol  - Improved today.   2. H/O RecurrentDL infection with subxiphoid abscess - s/p debridement and pec flap in 4/21 - remains onkeflexand rifabutin -has recurrent subxiphoid abscess s/p I&D by Jonathan Wood in clinic 6/29 - Blood cultures ordered but not obtained. Will contact lab.  - repeat CT of chest and abdomen today---> Dr Maren Beach - He is not a candidate for pump exchange given severe RV dysfunction if infection getting worse.  3.Chronic Biventricular HF with HMIII LVAD  - EF 10% s/p HM-3 LVAD on 09/06/17 - Volume status stable. Continue torsemide 20 mg every other day.  - Continue spiro + sildenafil.  - No bb with RV failure.  - RV severely HK   4. HM3 LVAD 09/06/2017. - VAD interrogated personally. Parameters stable. - LDH 557>388 Follow daily.  - Check INR. INR 2 Goal 2.0-2.5  - Off ASA with PUD.   5. Essential HTN -Stable today. Continue current regimen.   6. OSA - sleep study with very severe OSA (AHI 69/hr) - continues to refuse therapy. We have arranged  f/u in sleep clinic several times but he has not been complaint  7. H/o GI bleed  - 08/2018 EGD completed and showed gastritis and nonbleeding duodenal ulcers, colonoscopy with 5 polyps removed. Protonix was increased to BID.  - No recent GI bleeding - Hgb 9.1. Check CBC in am.  - He will stay off aspirin for now.   8. Tobacco Abuse  - active smoker  - advised to quit   I reviewed the LVAD parameters from today, and compared  the results to the patient's prior recorded data.  No programming changes were made.  The LVAD is functioning within specified parameters.  The patient performs LVAD self-test daily.  LVAD interrogation was negative for any significant power changes, alarms or PI events/speed drops.  LVAD equipment check completed and is in good working order.  Back-up equipment present.   LVAD education done on emergency procedures and precautions and reviewed exit site care.  Length of Stay: 1  Tonye Becket, NP 11/14/2019, 12:35 PM  VAD Team --- VAD ISSUES ONLY--- Pager [phone] (7am - 7am)  Advanced Heart Failure Team  Pager [phone] (M-F; 7a - 4p)  Please contact [facility] for night-coverage after hours (4p -7a ) and weekends on [email or web address]  Patient seen and examined with the above-signed Advanced Practice Provider and/or Housestaff. I personally reviewed laboratory data, imaging studies and relevant notes. I independently examined the patient and formulated the important aspects of the plan. I have edited the note to reflect any of my changes or salient points. I have personally discussed the plan with the patient and/or family.  Says he feels better but still mildly tachypneic. No fevers or chills. WBC 7.1k. Wound cx.  NGTD < 24. LDH 557 -> 388. INR 2.0   Chest CT with large anterior fluid collection.   General:  Lying in bed. Mildly tachypneic.  HEENT: normal  Neck: supple. JVP 8-9  Carotids 2+ bilat; no bruits. No lymphadenopathy or thryomegaly appreciated. Cor: LVAD hum.  Superficial wound packed. No surrounding erythema. Minimal drainage Lungs: Clear. Abdomen: obese soft, nontender, non-distended. No hepatosplenomegaly. No bruits or masses. Good bowel sounds. Driveline site clean. Anchor in place.  Extremities: no cyanosis, clubbing, rash. Warm no edema  Neuro: alert & oriented x 3. No focal deficits. Moves all 4 without problem   Remains tachypneic. CT with large anterior fluid collection. No  PNA. Superficial wound cx NGTD. BCx drawn today. LDH improved. VAD interrogated personally. Parameters stable.  Discussed with TCTS. Hold coumadin for now for possible drainage of fluid collection.  Jonathan Meres, MD  9:31 PM

## 2019-11-14 NOTE — Progress Notes (Signed)
   CT concerning for possible abscess.   Per Dr Melissa Noon stop coumadin. No heparin for now. Will need to start once INR < 1.8.   Maanav Kassabian NP-C  4:09 PM

## 2019-11-14 NOTE — Progress Notes (Addendum)
LVAD Coordinator Rounding Note:  Admitted 11/13/19 by Dr. [name] due to shortness of breath, respiratory distress, and sternal wound management of subxiphoid abscess.   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 Laneta Simmers under Destination Therapy criteria.  Pt laying in bed this morning. Voices frustration over hospitalization, and "having to wait a day and a half" for his chest/abdomen CT scan "to figure out what is wrong with me." Repeatedly voices "I want to go home." Remains tachypneic this morning. Breathing labored with movement in bed. Explained importance of CT scan and need to make sure he is well before discharging home. Pt has finished drinking oral contrast, with plan to complete CT this morning.  Vital signs: Temp: 98.3 HR: 99 Auto BP: not done Doppler: 80 Sat: 96% on RA Wt: 210 lbs   LVAD interrogation reveals:  Speed: 5600 Flow: 4.6 Power:  4.1w PI: 3.6 Alarms: none Events: none Hematocrit: 27  Fixed speed: 5600  Low speed limit: 5300  Drive Line:  Left abdominal drive line dressing C/D/I with anchor intact. Weekly dressing changes per BS nurse. Dressing change due 11/20/19.  Sternal wound: Existing dressing clean, fry, intact. Wet to dry dressing changes BID per bedside RN.   Labs:  LDH trend: 388  INR trend: 2.0  WBC: 7.1  Infection: - Subxiphoid wound culture 11/13/19 >> moderate WBCs present, report pending - Urine culture 11/13/19 >> NGTD  Anticoagulation Plan: -INR Goal: 2.0 - 2.5  -ASA Dose: none  Adverse Events: - driveline cx 04/23/19>> staph aureus - Admitted for sternal/drive line infection. OR for debridement 08/20/19- + staph aureus wound cultures; further irrigation/debridements done 08/22/19, 08/24/19, 08/30/19, and 09/03/19. Debridement and closure of sternal wound with muscle flap 09/05/19.  - Admitted 11/13/19 for respiratory distress and wound management of subxiphoid abscess. UDS + THC & benzos.    Plan/Recommendations:  1. Call VAD pager if any  VAD equipment or drive line issues. 2. Weekly drive line dressing changes by bedside nurse. Next dressing change 11/20/19.   Alyce Pagan RN VAD Coordinator  Office: [phone]  24/7 Pager: [phone]

## 2019-11-14 NOTE — Plan of Care (Signed)

## 2019-11-14 NOTE — Progress Notes (Signed)
CT technician called RN for clarification of CT order. Order was written for CT of chest but notes stated CT of chest and abdomen. RN contacted LVAD coordinator Revonda Standard for clarification who stated that the CT of abdomen and chest is what's needed. RN called CT technician who changed order for CT. Contrast brought up by CT technician at 0630 for pt to drink starting at 0645 and stated that they will come and get pt at 0745 for CT. Pt currently drinking contrast.

## 2019-11-14 NOTE — Progress Notes (Signed)
Transported to radiology for Ct scan of chest /abd.  by bed accompanied by swot Rn  And back after few min. Verbalized being  so frustrated  , MD talked to pt.

## 2019-11-15 ENCOUNTER — Other Ambulatory Visit (HOSPITAL_COMMUNITY): Payer: Medicaid Other

## 2019-11-15 ENCOUNTER — Ambulatory Visit: Payer: Medicaid Other | Admitting: Plastic Surgery

## 2019-11-15 LAB — CBC
HCT: 30.8 % — ABNORMAL LOW (ref 39.0–52.0)
Hemoglobin: 9.3 g/dL — ABNORMAL LOW (ref 13.0–17.0)
MCH: 26 pg (ref 26.0–34.0)
MCHC: 30.2 g/dL (ref 30.0–36.0)
MCV: 86 fL (ref 80.0–100.0)
Platelets: 187 10*3/uL (ref 150–400)
RBC: 3.58 MIL/uL — ABNORMAL LOW (ref 4.22–5.81)
RDW: 16.4 % — ABNORMAL HIGH (ref 11.5–15.5)
WBC: 8.2 10*3/uL (ref 4.0–10.5)
nRBC: 0.7 % — ABNORMAL HIGH (ref 0.0–0.2)

## 2019-11-15 LAB — PROTIME-INR
INR: 2.9 — ABNORMAL HIGH (ref 0.8–1.2)
INR: 3.3 — ABNORMAL HIGH (ref 0.8–1.2)
Prothrombin Time: 29.2 seconds — ABNORMAL HIGH (ref 11.4–15.2)
Prothrombin Time: 32.1 seconds — ABNORMAL HIGH (ref 11.4–15.2)

## 2019-11-15 LAB — BASIC METABOLIC PANEL
Anion gap: 11 (ref 5–15)
BUN: 17 mg/dL (ref 6–20)
CO2: 18 mmol/L — ABNORMAL LOW (ref 22–32)
Calcium: 8.5 mg/dL — ABNORMAL LOW (ref 8.9–10.3)
Chloride: 101 mmol/L (ref 98–111)
Creatinine, Ser: 1.34 mg/dL — ABNORMAL HIGH (ref 0.61–1.24)
GFR calc Af Amer: >60 mL/min (ref >60)
GFR calc non Af Amer: >60 mL/min (ref >60)
Glucose, Bld: 113 mg/dL — ABNORMAL HIGH (ref 70–99)
Potassium: 4.3 mmol/L (ref 3.5–5.1)
Sodium: 130 mmol/L — ABNORMAL LOW (ref 135–145)

## 2019-11-15 LAB — AEROBIC CULTURE W GRAM STAIN (SUPERFICIAL SPECIMEN): Culture: NO GROWTH

## 2019-11-15 LAB — LACTATE DEHYDROGENASE: LDH: 410 U/L — ABNORMAL HIGH (ref 98–192)

## 2019-11-15 NOTE — Progress Notes (Deleted)
Patient is a 49 year old male here for follow-up after undergoing pectoralis turnover flap with placement of ACell powder on 09/05/2019 with Dr. Ulice Bold and Dr. Morton Peters.  In early June he was found to have developed a fluid collection at the distal aspect of his sternal incision; had CT scan on 10/24/2019; and incision and drainage by Dr. Donata Clay on 10/24/2019.  He had donated a cell placed on 10/26/2019 and has been doing dressing changes consisting of Adaptic every other day, K-Y jelly daily, replacement of gauze daily.

## 2019-11-15 NOTE — Progress Notes (Addendum)
Advanced Heart Failure VAD Team Note  PCP-Cardiologist: Arvilla Meres, MD   Subjective:   Admitted with tachypnea + infection /LVAD.   Off  Coumadin. INR 2.9.  Remains frustrated. Denies SOB.   LVAD INTERROGATION:  HeartMate III LVAD:   Flow 4.6   liters/min, speed 5600, power 4, PI 3.3  Objective:    Vital Signs:   Temp:  [97.7 F (36.5 C)-98.7 F (37.1 C)] 97.7 F (36.5 C) (07/01 0730) Pulse Rate:  [89-108] 107 (07/01 0437) Resp:  [18-20] 20 (07/01 0730) BP: (70-112)/(56-96) 104/73 (07/01 0730) SpO2:  [95 %-99 %] 99 % (07/01 0437) Weight:  [96.7 kg] 96.7 kg (07/01 0500) Last BM Date: 11/13/19 Mean arterial Pressure 70s   Intake/Output:   Intake/Output Summary (Last 24 hours) at 11/15/2019 0825 Last data filed at 11/15/2019 0631 Gross per 24 hour  Intake 370 ml  Output 150 ml  Net 220 ml     Physical Exam  Physical Exam: GENERAL: No acute distress. HEENT: normal  NECK: Supple, JVP 6-7   .  2+ bilaterally, no bruits.  No lymphadenopathy or thyromegaly appreciated.   CARDIAC:  Mechanical heart sounds with LVAD hum present.  Sternal dressing intact  LUNGS:  Clear to auscultation bilaterally.  ABDOMEN:  Soft, round, nontender, positive bowel sounds x4.     LVAD exit site: well-healed and incorporated.  Dressing dry and intact.  No erythema or drainage.  Stabilization device present and accurately applied.  Driveline dressing is being changed daily per sterile technique. EXTREMITIES:  Warm and dry, no cyanosis, clubbing, rash or edema  NEUROLOGIC:  Alert and oriented x 3.    No aphasia.  No dysarthria.  Affect pleasant.      Telemetry  SR -ST 90-100s   EKG    n/a  Labs   Basic Metabolic Panel: Recent Labs  Lab 11/13/19 1108 11/14/19 0216 11/15/19 0342  NA 135 136 130*  K 3.7 3.8 4.3  CL 105 107 101  CO2 19* 20* 18*  GLUCOSE 172* 105* 113*  BUN 16 13 17   CREATININE 1.14 0.87 1.34*  CALCIUM 9.0 8.5* 8.5*    Liver Function Tests: Recent Labs    Lab 11/13/19 1108  AST 286*  ALT 163*  ALKPHOS 131*  BILITOT 1.9*  PROT 7.7  ALBUMIN 3.0*   No results for input(s): LIPASE, AMYLASE in the last 168 hours. No results for input(s): AMMONIA in the last 168 hours.  CBC: Recent Labs  Lab 11/13/19 1108 11/14/19 0216 11/15/19 0342  WBC 8.9 7.1 8.2  HGB 9.5* 9.1* 9.3*  HCT 31.4* 30.3* 30.8*  MCV 85.3 84.9 86.0  PLT 197 192 187    INR: Recent Labs  Lab 11/09/19 1106 11/13/19 1108 11/14/19 0216 11/15/19 0342  INR 1.4* 1.9* 2.0* 2.9*    Other results:    Imaging   DG Chest 2 View  Result Date: 11/13/2019 CLINICAL DATA:  Tachypnea. 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 EXAM: CHEST - 2 VIEW COMPARISON:  10/22/2019 FINDINGS: LVAD device in place. Previous median sternotomy. Cardiac enlargement, stable. Pulmonary vascular congestion and small left pleural effusion unchanged. No airspace densities. IMPRESSION: Cardiac enlargement, pulmonary vascular congestion and small left pleural effusion. Electronically Signed   By: 12/22/2019 M.D.   On: 11/13/2019 11:45   CT CHEST W CONTRAST  Result Date: 11/14/2019 CLINICAL DATA:  Soft tissue infection suspected, drainage at LVAD wire insertion site, assess for abscess EXAM: CT CHEST AND ABDOMEN WITH CONTRAST TECHNIQUE:  Multidetector CT imaging of the chest and abdomen was performed following the standard protocol during bolus administration of intravenous contrast. CONTRAST:  85mL OMNIPAQUE IOHEXOL 300 MG/ML  SOLN COMPARISON:  08/16/2019 FINDINGS: CT CHEST FINDINGS Cardiovascular: Cardiomegaly. Status post median sternotomy and LVAD placement. No pericardial effusion. Mediastinum/Nodes: Prominent, nonspecific mediastinal lymph nodes. Thyroid gland, trachea, and esophagus demonstrate no significant findings. Lungs/Pleura: Paraseptal emphysema. Diffuse bilateral interlobular septal thickening. Bandlike scarring and or atelectasis throughout. Small bilateral  pleural effusions. Musculoskeletal: No chest wall mass or suspicious bone lesions identified. There is an elongated subcutaneous fluid collection overlying median sternotomy wound, measuring 13.7 x 10.1 x 2.6 cm (series 3, image 44, series 7, image 102). Smaller rounded fluid collection overlying the left pectoralis major measuring 2.9 cm, likely related to prior pacer placement (series 3, image 13). Bilateral gynecomastia. CT ABDOMEN FINDINGS Hepatobiliary: No solid liver abnormality is seen. Hepatic steatosis. No gallstones, gallbladder wall thickening, or biliary dilatation. Pancreas: Unremarkable. No pancreatic ductal dilatation or surrounding inflammatory changes. Spleen: Normal in size without significant abnormality. Adrenals/Urinary Tract: Adrenal glands are unremarkable. Kidneys are normal, without renal calculi, solid lesion, or hydronephrosis. Stomach/Bowel: Stomach is within normal limits. Appendix appears normal. No evidence of bowel wall thickening, distention, or inflammatory changes. Vascular/Lymphatic: No significant vascular findings are present. No enlarged abdominal lymph nodes. Other: There is a small, elongated fluid collection just inferior to the drive line in the superficial soft tissues of the left hemiabdomen, measuring approximately 7.8 cm in length and no greater than 9 mm in thickness (series 3, image 79, series 7, image 133). No abdominopelvic ascites. Musculoskeletal: No acute or significant osseous findings. IMPRESSION: 1. There is an elongated subcutaneous fluid collection overlying median sternotomy wound, measuring 13.7 x 10.1 x 2.6 cm. 2. Smaller rounded fluid collection overlying the left pectoralis major measuring 2.9 cm, likely related to prior pacer placement. 3. There is a small, elongated fluid collection just inferior to the drive line in the superficial soft tissues of the left hemiabdomen, measuring approximately 7.8 cm in length and no greater than 9 mm in thickness. 4.  The presence or absence of infection in any of the above described fluid collections is not established by CT. 5. Cardiomegaly with diffuse bilateral interlobular septal thickening and small bilateral pleural effusions, consistent with pulmonary edema. 6. Emphysema (ICD10-J43.9). 7. Hepatic steatosis. Electronically Signed   By: Lauralyn Primes M.D.   On: 11/14/2019 11:47   CT ABDOMEN W CONTRAST  Result Date: 11/14/2019 CLINICAL DATA:  Soft tissue infection suspected, drainage at LVAD wire insertion site, assess for abscess EXAM: CT CHEST AND ABDOMEN WITH CONTRAST TECHNIQUE: Multidetector CT imaging of the chest and abdomen was performed following the standard protocol during bolus administration of intravenous contrast. CONTRAST:  68mL OMNIPAQUE IOHEXOL 300 MG/ML  SOLN COMPARISON:  08/16/2019 FINDINGS: CT CHEST FINDINGS Cardiovascular: Cardiomegaly. Status post median sternotomy and LVAD placement. No pericardial effusion. Mediastinum/Nodes: Prominent, nonspecific mediastinal lymph nodes. Thyroid gland, trachea, and esophagus demonstrate no significant findings. Lungs/Pleura: Paraseptal emphysema. Diffuse bilateral interlobular septal thickening. Bandlike scarring and or atelectasis throughout. Small bilateral pleural effusions. Musculoskeletal: No chest wall mass or suspicious bone lesions identified. There is an elongated subcutaneous fluid collection overlying median sternotomy wound, measuring 13.7 x 10.1 x 2.6 cm (series 3, image 44, series 7, image 102). Smaller rounded fluid collection overlying the left pectoralis major measuring 2.9 cm, likely related to prior pacer placement (series 3, image 13). Bilateral gynecomastia. CT ABDOMEN FINDINGS Hepatobiliary: No solid liver abnormality is seen.  Hepatic steatosis. No gallstones, gallbladder wall thickening, or biliary dilatation. Pancreas: Unremarkable. No pancreatic ductal dilatation or surrounding inflammatory changes. Spleen: Normal in size without  significant abnormality. Adrenals/Urinary Tract: Adrenal glands are unremarkable. Kidneys are normal, without renal calculi, solid lesion, or hydronephrosis. Stomach/Bowel: Stomach is within normal limits. Appendix appears normal. No evidence of bowel wall thickening, distention, or inflammatory changes. Vascular/Lymphatic: No significant vascular findings are present. No enlarged abdominal lymph nodes. Other: There is a small, elongated fluid collection just inferior to the drive line in the superficial soft tissues of the left hemiabdomen, measuring approximately 7.8 cm in length and no greater than 9 mm in thickness (series 3, image 79, series 7, image 133). No abdominopelvic ascites. Musculoskeletal: No acute or significant osseous findings. IMPRESSION: 1. There is an elongated subcutaneous fluid collection overlying median sternotomy wound, measuring 13.7 x 10.1 x 2.6 cm. 2. Smaller rounded fluid collection overlying the left pectoralis major measuring 2.9 cm, likely related to prior pacer placement. 3. There is a small, elongated fluid collection just inferior to the drive line in the superficial soft tissues of the left hemiabdomen, measuring approximately 7.8 cm in length and no greater than 9 mm in thickness. 4. The presence or absence of infection in any of the above described fluid collections is not established by CT. 5. Cardiomegaly with diffuse bilateral interlobular septal thickening and small bilateral pleural effusions, consistent with pulmonary edema. 6. Emphysema (ICD10-J43.9). 7. Hepatic steatosis. Electronically Signed   By: Lauralyn Primes M.D.   On: 11/14/2019 11:47     Medications:     Scheduled Medications: . cephALEXin  500 mg Oral TID  . docusate sodium  100 mg Oral TID  . DULoxetine  60 mg Oral Daily  . gabapentin  300 mg Oral TID  . hydrALAZINE  50 mg Oral TID  . linaclotide  145 mcg Oral QODAY  . mouth rinse  15 mL Mouth Rinse BID  . oxyCODONE  10 mg Oral Q8H  .  pantoprazole  40 mg Oral BID  . rifabutin  300 mg Oral Daily  . sildenafil  20 mg Oral TID  . spironolactone  25 mg Oral Daily  . torsemide  20 mg Oral QODAY  . 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   Admitted VAD clinic with recurrent infection.    Assessment/Plan:    1. Respiratory Distress - On admit tachypneic w/ RR in the 40s. O2 sats 98% on RA  - SARS2 negative.  - UDS + benzo + tetrahydrocannabinol  - Improved today.   2. H/O RecurrentDL infection with subxiphoid abscess - s/p debridement and pec flap in 4/21 - remains onkeflexand rifabutin -has recurrent subxiphoid abscess s/p I&D by Dr. Donata Clay in clinic 6/29 - Blood cultures 6/30 - NGTD.  - CT of chest and abdomen  ? Fluid collection. Possible abscess. -- Debridement.  - He is not a candidate for pump exchange given severe RV dysfunction if infection getting worse.  3.Chronic Biventricular HF with HMIII LVAD  - EF 10% s/p HM-3 LVAD on 09/06/17 - Volume status stable.  Continue torsemide 20 mg every other day.  - Continue spiro + sildenafil.  - No bb with RV failure.  -  RV severely HK   4. HM3 LVAD 09/06/2017. - VAD interrogated personally. Parameters stable. - LDH 557>388> 410  Follow daily.  - Check INR. INR 2.9  Goal 2.0-2.5  - Off ASA with PUD.   5. Essential HTN -Stable today. Continue current regimen.   6. OSA - sleep study with very severe OSA (AHI 69/hr) - continues to refuse therapy. We have arranged f/u in sleep clinic several times but he has not been complaint  7. H/o GI bleed  - 08/2018 EGD completed and showed gastritis and nonbleeding duodenal ulcers, colonoscopy with 5 polyps removed. Protonix was  increased to BID.  - No recent GI bleeding - Hgb 9.3  - He will stay off aspirin for now.   8. Tobacco Abuse  - active smoker  - advised to quit   9. Elevated LFTs Elevated on admit.  Repeat LFTs in am   I reviewed the LVAD parameters from today, and compared the results to the patient's prior recorded data.  No programming changes were made.  The LVAD is functioning within specified parameters.  The patient performs LVAD self-test daily.  LVAD interrogation was negative for any significant power changes, alarms or PI events/speed drops.  LVAD equipment check completed and is in good working order.  Back-up equipment present.   LVAD education done on emergency procedures and precautions and reviewed exit site care.  Length of Stay: 2  Tonye Becket, NP 11/15/2019, 8:25 AM  VAD Team --- VAD ISSUES ONLY--- Pager [phone] (7am - 7am)  Advanced Heart Failure Team  Pager [phone] (M-F; 7a - 4p)  Please contact [facility] for night-coverage after hours (4p -7a ) and weekends on [email or web address]  Patient seen and examined with the above-signed Advanced Practice Provider and/or Housestaff. I personally reviewed laboratory data, imaging studies and relevant notes. I independently examined the patient and formulated the important aspects of the plan. I have edited the note to reflect any of my changes or salient points. I have personally discussed the plan with the patient and/or family.  Feeling better this am. Respiratory rate improved. Denies pain. Remains afebrile. Wound cx NGTD x 2 days. Volume status ok. VAD interrogated personally. Parameters stable.  General:  NAD.  HEENT: normal  Neck: supple. JVP not elevated.  Carotids 2+ bilat; no bruits. No lymphadenopathy or thryomegaly appreciated. Cor: LVAD hum. Dressing site ok.  Lungs: Clear. Abdomen: obese soft, nontender, non-distended. No hepatosplenomegaly. No bruits or masses. Good bowel sounds. Driveline site clean. Anchor in place.    Extremities: no cyanosis, clubbing, rash. Warm no edema  Neuro: alert & oriented x 3. No focal deficits. Moves all 4 without problem   I reviewed CT scan with Dr. Donata Clay and he feels that the fluid collection noted on CT is the pec flap itself and not and abscess. Will ask IR to evaluate and see if they feel there is anything to drain percutaneously. Warfarin on hold. INR 3.3 . Will continue to hold. Discussed with PharmD personally.   Arvilla Meres, MD  6:25 PM

## 2019-11-15 NOTE — Plan of Care (Signed)
  Problem: Education: Goal: Knowledge of General Education information will improve Description: Including pain rating scale, medication(s)/side effects and non-pharmacologic comfort measures Outcome: Progressing   Problem: Health Behavior/Discharge Planning: Goal: Ability to manage health-related needs will improve Outcome: Progressing   Problem: Clinical Measurements: Goal: Ability to maintain clinical measurements within normal limits will improve Outcome: Progressing Goal: Will remain free from infection Outcome: Progressing Goal: Diagnostic test results will improve Outcome: Progressing Goal: Respiratory complications will improve Outcome: Progressing Goal: Cardiovascular complication will be avoided Outcome: Progressing   Problem: Activity: Goal: Risk for activity intolerance will decrease Outcome: Progressing   Problem: Nutrition: Goal: Adequate nutrition will be maintained Outcome: Progressing   Problem: Coping: Goal: Level of anxiety will decrease Outcome: Progressing   Problem: Elimination: Goal: Will not experience complications related to bowel motility Outcome: Progressing Goal: Will not experience complications related to urinary retention Outcome: Progressing   Problem: Pain Managment: Goal: General experience of comfort will improve Outcome: Progressing   Problem: Safety: Goal: Ability to remain free from injury will improve Outcome: Progressing   Problem: Skin Integrity: Goal: Risk for impaired skin integrity will decrease Outcome: Progressing   Problem: Education: Goal: Ability to demonstrate management of disease process will improve Outcome: Progressing Goal: Ability to verbalize understanding of medication therapies will improve Outcome: Progressing Goal: Individualized Educational Video(s) Outcome: Progressing   Problem: Activity: Goal: Capacity to carry out activities will improve Outcome: Progressing   Problem: Cardiac: Goal:  Ability to achieve and maintain adequate cardiopulmonary perfusion will improve Outcome: Progressing   Problem: Education: Goal: Patient will understand all VAD equipment and how it functions Outcome: Progressing Goal: Patient will be able to verbalize current INR target range and antiplatelet therapy for discharge home Outcome: Progressing   Problem: Cardiac: Goal: LVAD will function as expected and patient will experience no clinical alarms Outcome: Progressing   

## 2019-11-15 NOTE — Progress Notes (Signed)
LVAD Coordinator Rounding Note:  Admitted 11/13/19 by Dr. [name] due to shortness of breath, respiratory distress, and sternal wound management of subxiphoid abscess.   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 Laneta Simmers under Destination Therapy criteria.  Pt sitting up in bed, remains frustrated about his current hospitalization. Had a long discussion with the pt about his UDS for this admission and his goals of advanced therapy. Pt states that he is drinking at least 1 can of beer every night and taking xanax to try to sleep. I will speak with our social worker Lasandra Beech regarding a psych referral for this pt. Pt voices the need for help to quit marijuana use, smoking cigarettes, bad eating habits and the ETOH use so that transplant could be considered in the future.  Vital signs: Temp: 97.4 HR: 95 Auto BP: 93/78 (84) Doppler: 78 Sat: 100% on RA Wt: 210>213.8 lbs   LVAD interrogation reveals:  Speed: 5600 Flow: 4.6 Power:  4.1w PI: 3.5 Alarms: none Events: none Hematocrit: 30  Fixed speed: 5600  Low speed limit: 5300  Drive Line:  Left abdominal drive line dressing C/D/I with anchor intact. Weekly dressing changes per BS nurse. Dressing change due 11/20/19.  Sternal wound: Existing dressing clean, fry, intact. Wet to dry dressing changes BID per bedside RN.   Labs:  LDH trend: 388>410  INR trend: 2.0>2.9>3.3  WBC: 7.1>8.2  AST/ALT: 286/163  Infection: - Subxiphoid wound culture 11/13/19 >> moderate WBCs present, report pending - Urine culture 11/13/19 >> NGTD  Anticoagulation Plan: -INR Goal: 2.0 - 2.5  -ASA Dose: none  Adverse Events: - driveline cx 04/23/19>> staph aureus - Admitted for sternal/drive line infection. OR for debridement 08/20/19- + staph aureus wound cultures; further irrigation/debridements done 08/22/19, 08/24/19, 08/30/19, and 09/03/19. Debridement and closure of sternal wound with muscle flap 09/05/19.  - Admitted 11/13/19 for respiratory distress and  wound management of subxiphoid abscess. UDS + THC & benzos.    Plan/Recommendations:  1. Call VAD pager if any VAD equipment or drive line issues. 2. Weekly drive line dressing changes by bedside nurse. Next dressing change 11/20/19.   Carlton Adam RN VAD Coordinator  Office: [phone]  24/7 Pager: [phone]

## 2019-11-16 ENCOUNTER — Other Ambulatory Visit (HOSPITAL_COMMUNITY): Payer: Self-pay | Admitting: *Deleted

## 2019-11-16 ENCOUNTER — Other Ambulatory Visit: Payer: Self-pay | Admitting: *Deleted

## 2019-11-16 ENCOUNTER — Inpatient Hospital Stay (HOSPITAL_COMMUNITY): Payer: Medicare Other

## 2019-11-16 ENCOUNTER — Encounter (HOSPITAL_COMMUNITY): Payer: Self-pay | Admitting: Internal Medicine

## 2019-11-16 DIAGNOSIS — Z7901 Long term (current) use of anticoagulants: Secondary | ICD-10-CM

## 2019-11-16 DIAGNOSIS — Z95811 Presence of heart assist device: Secondary | ICD-10-CM

## 2019-11-16 LAB — CBC
HCT: 30.8 % — ABNORMAL LOW (ref 39.0–52.0)
Hemoglobin: 9.3 g/dL — ABNORMAL LOW (ref 13.0–17.0)
MCH: 25.8 pg — ABNORMAL LOW (ref 26.0–34.0)
MCHC: 30.2 g/dL (ref 30.0–36.0)
MCV: 85.6 fL (ref 80.0–100.0)
Platelets: 196 10*3/uL (ref 150–400)
RBC: 3.6 MIL/uL — ABNORMAL LOW (ref 4.22–5.81)
RDW: 16.4 % — ABNORMAL HIGH (ref 11.5–15.5)
WBC: 8 10*3/uL (ref 4.0–10.5)
nRBC: 1.2 % — ABNORMAL HIGH (ref 0.0–0.2)

## 2019-11-16 LAB — BASIC METABOLIC PANEL
Anion gap: 10 (ref 5–15)
BUN: 19 mg/dL (ref 6–20)
CO2: 21 mmol/L — ABNORMAL LOW (ref 22–32)
Calcium: 8.6 mg/dL — ABNORMAL LOW (ref 8.9–10.3)
Chloride: 99 mmol/L (ref 98–111)
Creatinine, Ser: 1.34 mg/dL — ABNORMAL HIGH (ref 0.61–1.24)
GFR calc Af Amer: >60 mL/min (ref >60)
GFR calc non Af Amer: >60 mL/min (ref >60)
Glucose, Bld: 109 mg/dL — ABNORMAL HIGH (ref 70–99)
Potassium: 4.1 mmol/L (ref 3.5–5.1)
Sodium: 130 mmol/L — ABNORMAL LOW (ref 135–145)

## 2019-11-16 LAB — HEPATIC FUNCTION PANEL
ALT: 223 U/L — ABNORMAL HIGH (ref 0–44)
AST: 250 U/L — ABNORMAL HIGH (ref 15–41)
Albumin: 3.1 g/dL — ABNORMAL LOW (ref 3.5–5.0)
Alkaline Phosphatase: 128 U/L — ABNORMAL HIGH (ref 38–126)
Bilirubin, Direct: 0.8 mg/dL — ABNORMAL HIGH (ref 0.0–0.2)
Indirect Bilirubin: 1.1 mg/dL — ABNORMAL HIGH (ref 0.3–0.9)
Total Bilirubin: 1.9 mg/dL — ABNORMAL HIGH (ref 0.3–1.2)
Total Protein: 7.7 g/dL (ref 6.5–8.1)

## 2019-11-16 LAB — PROTIME-INR
INR: 3.4 — ABNORMAL HIGH (ref 0.8–1.2)
Prothrombin Time: 33.5 seconds — ABNORMAL HIGH (ref 11.4–15.2)

## 2019-11-16 LAB — LACTATE DEHYDROGENASE: LDH: 430 U/L — ABNORMAL HIGH (ref 98–192)

## 2019-11-16 MED ORDER — FENTANYL CITRATE (PF) 100 MCG/2ML IJ SOLN
INTRAMUSCULAR | Status: AC
  Filled 2019-11-16: qty 2

## 2019-11-16 MED ORDER — SODIUM CHLORIDE 0.9% FLUSH: 3.0000 mL | INTRAVENOUS | Status: DC | PRN

## 2019-11-16 MED ORDER — WARFARIN SODIUM 5 MG PO TABS: ORAL_TABLET | ORAL | 3 refills | Status: DC

## 2019-11-16 MED ORDER — LIDOCAINE HCL 1 % IJ SOLN
5.0000 mL | Freq: Once | INTRAMUSCULAR | Status: DC
  Filled 2019-11-16 (×2): qty 5

## 2019-11-16 MED ORDER — MIDAZOLAM HCL 2 MG/2ML IJ SOLN
INTRAMUSCULAR | Status: AC
  Filled 2019-11-16: qty 2

## 2019-11-16 MED ORDER — SODIUM CHLORIDE 0.9 % IV SOLN
INTRAVENOUS | Status: AC | PRN
  Administered 2019-11-16: 10 mL/h via INTRAVENOUS

## 2019-11-16 MED ORDER — LIDOCAINE HCL (PF) 1 % IJ SOLN
INTRAMUSCULAR | Status: AC
  Filled 2019-11-16: qty 30

## 2019-11-16 MED ORDER — WARFARIN - PHARMACIST DOSING INPATIENT: Freq: Every day | Status: DC

## 2019-11-16 NOTE — Discharge Instructions (Signed)
Hold Coumadin (Warfarin) over the weekend. Resume regular home schedule starting Monday evening 7/5.

## 2019-11-16 NOTE — Progress Notes (Signed)
Exit site care:  Existing VAD dressing removed and site care performed using sterile technique. Drive line exit site cleaned with Chlora prep applicators x 2, allowed to dry, and Sorbaview dressing with bio patch re-applied. Exit site healed and incorporated, the velour is fully implanted at exit site. No redness, tenderness, drainage, foul odor or rash noted. Drive line anchor re-applied.   Return for INR check on Tuesday 11/20/19. Return for full VAD visit 11/23/19 at 11:00. Pt and mother verbalized understanding.   Alyce Pagan RN VAD Coordinator  Office: [phone]  24/7 Pager: [phone]

## 2019-11-16 NOTE — TOC Initial Note (Signed)
Transition of Care [facility]) - Initial/Assessment Note    Patient Details  Name: Jonathan Wood MRN: [id number] Date of Birth: 01/14/1971  Transition of Care [facility]) CM/SW Contact:    Leone Haven, RN Phone Number: 11/16/2019, 5:18 PM  Clinical Narrative:                 Patient for dc today, he has transportation and Medicaid insurance for his medications.    Expected Discharge Plan: Home/Self Care Barriers to Discharge: No Barriers Identified   Patient Goals and CMS Choice Patient states their goals for this hospitalization and ongoing recovery are:: get better   Choice offered to / list presented to : NA  Expected Discharge Plan and Services Expected Discharge Plan: Home/Self Care   Discharge Planning Services: CM Consult Post Acute Care Choice: NA Living arrangements for the past 2 months: Single Family Home Expected Discharge Date: 11/16/19                 DME Agency: NA       HH Arranged: NA          Prior Living Arrangements/Services Living arrangements for the past 2 months: Single Family Home Lives with:: Minor Children Patient language and need for interpreter reviewed:: Yes        Need for Family Participation in Patient Care: No (Comment) Care giver support system in place?: No (comment)   Criminal Activity/Legal Involvement Pertinent to Current Situation/Hospitalization: No - Comment as needed  Activities of Daily Living Home Assistive Devices/Equipment: Other (Comment) ADL Screening (condition at time of admission) Patient's cognitive ability adequate to safely complete daily activities?: Yes Is the patient deaf or have difficulty hearing?: No Does the patient have difficulty seeing, even when wearing glasses/contacts?: No Does the patient have difficulty concentrating, remembering, or making decisions?: No Patient able to express need for assistance with ADLs?: Yes Does the patient have difficulty dressing or bathing?:  No Independently performs ADLs?: Yes (appropriate for developmental age) Does the patient have difficulty walking or climbing stairs?: No Weakness of Legs: Both Weakness of Arms/Hands: None  Permission Sought/Granted                  Emotional Assessment       Orientation: : Oriented to Self, Oriented to Place, Oriented to  Time, Oriented to Situation Alcohol / Substance Use: Not Applicable Psych Involvement: No (comment)  Admission diagnosis:  Infection associated with driveline of left ventricular assist device (LVAD) (HCC) [T05.7XXA] Patient Active Problem List   Diagnosis Date Noted  . Generalized anxiety disorder 09/03/2019  . Cigarette smoker 08/18/2019  . COPD (chronic obstructive pulmonary disease) (HCC) 08/18/2019  . Hypertension 08/18/2019  . Bacteremia due to methicillin susceptible Staphylococcus aureus (MSSA) 08/18/2019  . Sepsis (HCC) 08/16/2019  . Increased abdominal girth 10/11/2018  . Chronic constipation 10/11/2018  . Rectal bleeding 10/11/2018  . History of colonic polyps 09/24/2018  . Hemorrhoids 09/24/2018  . Long term current use of anticoagulant therapy 09/24/2018  . OSA on CPAP 09/06/2018  . 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/26/2017  . Anxiety 09/30/2017  . Normocytic anemia 09/30/2017  . LVAD (left ventricular assist device) present (HCC)   . Advanced care planning/counseling discussion   . Goals of care, counseling/discussion   . Palliative care encounter   . Elevated LFTs 08/25/2017   PCP:  Claiborne Rigg, NP  Pharmacy:   [facility] - [place] - [address] [place] [address] Phone: [phone] Fax: [phone]     Social Determinants of Health (SDOH) Interventions    Readmission Risk Interventions Readmission Risk Prevention Plan 11/16/2019   Transportation Screening Complete  PCP or Specialist Appt within 3-5 Days Complete  HRI or Home Care Consult Complete  Social Work Consult for Recovery Care Planning/Counseling Complete  Palliative Care Screening Not Applicable  Medication Review Oceanographer) Complete  Some recent data might be hidden

## 2019-11-16 NOTE — Discharge Summary (Addendum)
Advanced Heart Failure Team  Discharge Summary   Patient ID: Jonathan Wood MRN: [id number], DOB/AGE: 06/05/1970 49 y.o. Admit date: 11/13/2019 D/C date:     11/16/2019   Primary Discharge Diagnoses:   Tachypnea of Uncertain Etiology (? Anxiety)  H/o recurrent Driveline Infection w/ Subxiphoid abscess   Chronic Suppressive Abx Therapy   Chronic Biventricular HF w/ HMIII LVAD  Chronic Anticoagulation Therapy (Coumadin)   Supra therapeutic INR   Essential Hypertension   H/o GIB   OSA on CPAP  Elevated LFTs   Tobacco Abuse   Hyponatremia Anemia of chronic disease    Hospital Course:   Jonathan Wood a 49 y.o.malewith a history of 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  Admitted 08/21/2018 with volume overload and anemia. Diuresed with IV lasix and transitioned to torsemide 20 mg daily. GI consulted EGD completed and showed gastritis and nonbleeding duodenal ulcers, colonoscopy with 5 polyps removed. Protonix increased to BID, he was started on PO iron,and hydrocortisone suppositories for hemorrhoid treatment. He received a total of 2 uPRBCs. He will stay off ASA forseveral weeksbut can reconsider later. Discharge weight was 221 pounds.   Admitted 12/7-15/20 for DL infection with overlying cellulitis and volume overload. Treated with IV abx and diuresis.   Recently admitted 08/16/19-09/13/19 for DL infection with large subxiphoid abscess. Underwent several debridements and eventually a pec muscle flap. Wound cx + MSSA. Initially started on ancef and rifabutin but expanded to cefipime for a weekthen changed toIV ancef and po rifabutin. Abx later changed to oral keflex and rifabutin.  Presented to [facility] clinic on 6/29 for routine f/u. Found to have recurrent subcutaneous chest abscess and marked tachypnea of unclear etiology. Abscess open in clinic by Dr. Donata Clay. Felt to be superficial. Denied fevers  or chills.  No leukocytosis. Euvolemic on exam. CXR with small left effusion but no frank edema or infiltrate. COVID test negative.   He was admitted for further w/u. Coumadin was held for possible procedures. Home abx were continued. Blood, urine and wound cultures were negative. He remained AF w/ normal WBC. Tachypnea resolved ? If related to anxiety. CT of chest showed large anterior fluid collection. No PNA. CT of chest and abdomen reviewed w/ Dr. Donata Clay who felt that the fluid collection noted on CT is the pec flap itself and not an abscess. IR was consulted and performed US-guided fluid aspiration and drainage. Abx regimen discussed w/ ID who recommended discontinuation of Rifabutin. He will continue on Keflex.   On 7/2, he was seen and examined by Dr. [name] and felt stable for d/c home. His INR day of discharge was supra therapeutic at 3.4 but no abnormal bleeding. He was instructed to hold his coumadin x 2 doses and will resume regular home regimen again on 7/5 and return for repeat INR check on 7/6.    See Detail Hospital Problem List Below   1. Respiratory Distress - On admit tachypneicw/ RR in the 40s. O2 sats 98% on RA  - SARS2 negative.  - UDS + benzo + tetrahydrocannabinol  - Improved today.   2.H/ORecurrentDL infection with subxiphoid abscess - s/p debridement and pec flap in 4/21 - remains onkeflexand rifabutin -has recurrent subxiphoid abscesss/p I&D by Dr. Donata Clay in clinic 6/29 - Blood cultures 6/30 - NGTD.  - CT of chest and abdomen ? Fluid collection. Possible abscess. -- Debridement.  -Heis not a candidate for pump  exchange given severe RV dysfunction if infection getting worse.  3.Chronic Biventricular HF with HMIII LVAD  - EF 10% s/p HM-3 LVAD on 09/06/17 -Volume status stable.  Continue torsemide 20 mg every other day.  - Continue spiro + sildenafil.  - No bb with RV failure.  - RV severely HK   4. HM3 LVAD 09/06/2017. - [facility] interrogated  personally. Parameters stable. -LDH 557>388> 410  Follow daily.  -Check INR. INR 2.9  Goal 2.0-2.5  - Off ASA with PUD.   5. Essential HTN -Stable today. Continue current regimen.   6. OSA - sleep study with very severe OSA (AHI 69/hr) - continues to refuse therapy. We have arranged f/u in sleep clinic several times but he has not been complaint  7. H/o GI bleed  - 08/2018 EGD completed and showed gastritis and nonbleeding duodenal ulcers, colonoscopy with 5 polyps removed. Protonix was increased to BID.  - No recent GI bleeding - Hgb 9.3  - He will stay off aspirin for now.   8. Tobacco Abuse  -active smoker - advised to quit  9. Elevated LFTs Elevated on admit.  Repeat LFTs in am   10. Hypnatremia - stable   Discharge Weight Range: 213 lb  Discharge Vitals: Blood pressure 97/82, pulse (!) 109, temperature 97.6 F (36.4 C), temperature source Oral, resp. rate 18, height 6\' 2"  (1.88 m), weight 96.7 kg, SpO2 98 %.  Labs: Lab Results  Component Value Date   WBC 8.0 11/16/2019   HGB 9.3 (L) 11/16/2019   HCT 30.8 (L) 11/16/2019   MCV 85.6 11/16/2019   PLT 196 11/16/2019    Recent Labs  Lab 11/16/19 0201  NA 130*  K 4.1  CL 99  CO2 21*  BUN 19  CREATININE 1.34*  CALCIUM 8.6*  PROT 7.7  BILITOT 1.9*  ALKPHOS 128*  ALT 223*  AST 250*  GLUCOSE 109*   Lab Results  Component Value Date   CHOL 111 08/30/2017   HDL 33 (L) 08/30/2017   LDLCALC 70 08/30/2017   TRIG 38 08/30/2017   BNP (last 3 results) Recent Labs    03/21/19 1415 04/23/19 1350  BNP 426.9* 709.6*    ProBNP (last 3 results) No results for input(s): PROBNP in the last 8760 hours.   Diagnostic Studies/Procedures   14/07/20 Abdomen Limited  Result Date: 11/16/2019 CLINICAL DATA:  49 year old male with a history LVAD and a known wound infection. CT imaging demonstrates questionable fluid collection and he presents for possible aspiration/drainage EXAM: ULTRASOUND ABDOMEN LIMITED  COMPARISON:  CT 11/14/2019 FINDINGS: In preparation for the attempt at ultrasound-guided drainage, ultrasound survey was performed. The correlate on ultrasound of the prior CT is striated musculature, corresponding to the muscle flap in position from the patient's reconstruction. No focal fluid is identified that would be drainable. Attempt at aspiration/drainage was deferred at this time. IMPRESSION: Ultrasound performed for possible aspiration demonstrates that the correlate of the prior CT imaging is the patient's muscle flap from prior reconstruction. No focal fluid for attempt at drainage. Discussed via telephone at the time of interpretation on 11/16/2019 at 10:50 am with Dr. 01/17/2020. Signed, [name]. Yvone Neu, RPVI [facility] Electronically Signed   By: [facility] D.O.   On: 11/16/2019 10:50    Discharge Medications   Allergies as of 11/16/2019   No Known Allergies     Medication List    STOP taking these medications   rifabutin 150 MG capsule Commonly  known as: MYCOBUTIN     TAKE these medications   albuterol 108 (90 Base) MCG/ACT inhaler Commonly known as: ProAir HFA Inhale 2 puffs into the lungs every 6 (six) hours as needed for wheezing or shortness of breath.   cephALEXin 500 MG capsule Commonly known as: KEFLEX Take 1 capsule (500 mg total) by mouth 3 (three) times daily.   docusate sodium 100 MG capsule Commonly known as: Colace Take 1 capsule (100 mg total) by mouth 3 (three) times daily.   DULoxetine 60 MG capsule Commonly known as: CYMBALTA Take 1 capsule (60 mg total) by mouth daily.   gabapentin 300 MG capsule Commonly known as: NEURONTIN TAKE ONE CAPSULE BY MOUTH THREE TIMES DAILY   hydrALAZINE 50 MG tablet Commonly known as: APRESOLINE Take 1 tablet (50 mg total) by mouth 3 (three) times daily.   hydrOXYzine 25 MG tablet Commonly known as: ATARAX/VISTARIL Take 1 tablet (25 mg total) by mouth  at bedtime as needed for anxiety.   linaclotide 145 MCG Caps capsule Commonly known as: Linzess Take 1 capsule (145 mcg total) by mouth daily before breakfast. What changed: when to take this   Oxycodone HCl 10 MG Tabs Take 1 tablet (10 mg total) by mouth every 8 (eight) hours.   pantoprazole 40 MG tablet Commonly known as: PROTONIX Take 1 tablet (40 mg total) by mouth 2 (two) times daily.   senna-docusate 8.6-50 MG tablet Commonly known as: Senokot-S Take 1 tablet by mouth 2 (two) times daily.   sildenafil 20 MG tablet Commonly known as: REVATIO TAKE 1 TABLET BY MOUTH THREE TIMES DAILY   spironolactone 25 MG tablet Commonly known as: ALDACTONE Take 1 tablet (25 mg total) by mouth daily.   torsemide 20 MG tablet Commonly known as: DEMADEX Take 1 tablet (20 mg total) by mouth every other day. May take extra if needed What changed:   when to take this  additional instructions   traMADol 50 MG tablet Commonly known as: ULTRAM Take 2 tablets (100 mg total) by mouth every 6 (six) hours as needed for moderate pain.   warfarin 5 MG tablet Commonly known as: COUMADIN Take as directed. If you are unsure how to take this medication, talk to your nurse or doctor. Original instructions: Take 2.5 mg (0.5 tab) every Monday, Wed, and Friday and 5 mg (1 tab) all other days. Start taking on: November 19, 2019 What changed:   additional instructions  These instructions start on November 19, 2019. If you are unsure what to do until then, ask your doctor or other care provider.   zinc sulfate 220 (50 Zn) MG capsule Take 1 capsule (220 mg total) by mouth daily.       Disposition   The patient will be discharged in stable condition to home.   Follow-up Information    [facility] Follow up on 11/20/2019.   Specialty: Cardiology Why: 12:00 pm for INR check in the Advanced Heart Failure Clinic  Contact information: [address] [id number] [place] [address] [phone]       Jonathan Wood, Bevelyn Buckles, MD Follow up on 11/23/2019.   Specialty: Cardiology Why: [facility] Clinic Appointment 11:00 AM  Contact information: [address] [place] [address] [phone]                 Duration of Discharge Encounter: Greater than 35 minutes   Signed, Knute Neu  11/16/2019, 2:59 PM

## 2019-11-16 NOTE — Progress Notes (Signed)
ANTICOAGULATION CONSULT NOTE - Initial Consult  Pharmacy Consult for Warfarin Indication: LVAD  No Known Allergies  Patient Measurements: Height: 6\' 2"  (188 cm) Weight: 96.7 kg (213 lb 3 oz) IBW/kg (Calculated) : 82.2   Vital Signs: Temp: 97.6 F (36.4 C) (07/02 0756) Temp Source: Oral (07/02 0756) BP: 97/82 (07/02 1020) Pulse Rate: 109 (07/02 1020)  Labs: Recent Labs    11/14/19 0216 11/14/19 0216 11/15/19 0342 11/15/19 1001 11/16/19 0201  HGB 9.1*   < > 9.3*  --  9.3*  HCT 30.3*  --  30.8*  --  30.8*  PLT 192  --  187  --  196  LABPROT 22.1*   < > 29.2* 32.1* 33.5*  INR 2.0*   < > 2.9* 3.3* 3.4*  CREATININE 0.87  --  1.34*  --  1.34*   < > = values in this interval not displayed.    Estimated Creatinine Clearance: 80.1 mL/min (A) (by C-G formula based on SCr of 1.34 mg/dL (H)).   Medical History: Past Medical History:  Diagnosis Date  . Asthma   . CHF (congestive heart failure) (HCC)    a. 09/2016: EF 20-25% with cath showing normal cors  . GERD (gastroesophageal reflux disease)   . History of hiatal hernia   . LVAD (left ventricular assist device) present (HCC)   . OSA on CPAP 09/06/2018   Severe OSA with AHI 68/hr on CPAP at 12cm H2O    Assessment: 49 yo male on chronic coumadin for LVAD.  Was supposed to take 5 mg daily prior to admission, and INR 1.9 day of admit.  Has continued to trend up to 3.4 despite no Coumadin since 6/29.  Has also been on chronic rifabutin.  Goal of Therapy:  INR 2-2.5 Monitor platelets by anticoagulation protocol: Yes   Plan:  Hold Coumadin through the weekend per discussion with Dr. 7/29. On Monday, resume Coumadin 5 mg daily except 2.5 mg on Mon, Wed, and Fridays. INR check next Tues 7/6.  9/6, Reece Leader, BCCP Clinical Pharmacist  11/16/2019 2:05 PM   [facility] pharmacy phone numbers are listed on [email or web address]

## 2019-11-16 NOTE — Sedation Documentation (Signed)
No fluid collection seen on Ultrasound by Dr Loreta Ave. He spoke with Dr Adella Hare. Procedure cancelled. Patient aware. LVAD nurse Revonda Standard was present during time in ultrasound and on transfers.

## 2019-11-16 NOTE — Progress Notes (Signed)
Provided with gauze 2x2 and 4x4, saline flush and tape to be used at home.

## 2019-11-16 NOTE — Progress Notes (Addendum)
Advanced Heart Failure VAD Team Note  PCP-Cardiologist: [name], MD   Subjective:   Admitted with tachypnea + infection /LVAD.   Off  Coumadin but INR continues to trend up 2.9>>3.3>>3.4. No bleeding Hgb stable at 9.3.  Hepatic enzymes elevated AST 250 ALT 223 Alk Phos 128 TB 1.9   Remains AF. WBC nl at 8.0.    Feels well today. Breathing improved. Sleeping w/ CPAP nightly. No significant pain.  LVAD INTERROGATION:  HeartMate III LVAD:   Flow 4.6 liters/min, speed 5600, power 4.2 PI 4.0  Objective:    Vital Signs:   Temp:  [97.4 F (36.3 C)-98.5 F (36.9 C)] 97.6 F (36.4 C) (07/02 0756) Pulse Rate:  [99-109] 109 (07/02 0756) Resp:  [14-20] 20 (07/02 0756) BP: (84-134)/(58-105) 103/78 (07/02 0756) SpO2:  [98 %-100 %] 99 % (07/02 0756) Last BM Date: 11/15/19 Mean arterial Pressure 70s   Intake/Output:   Intake/Output Summary (Last 24 hours) at 11/16/2019 0856 Last data filed at 11/15/2019 2134 Gross per 24 hour  Intake 240 ml  Output 325 ml  Net -85 ml     Physical Exam   GENERAL:  Laying in bed, sleeping w/ CPAP. No acute distress. HEENT: normal  NECK: Supple, JVD ~8 cm  2+ bilaterally, no bruits.  No lymphadenopathy or thyromegaly appreciated.   CARDIAC:  Mechanical heart sounds with LVAD hum present.  Sternal dressing intact  LUNGS:  Clear to auscultation bilaterally.  ABDOMEN:  Obese but soft, round, nontender, positive bowel sounds x4.     LVAD exit site: well-healed and incorporated.  Dressing dry and intact.  No erythema or drainage.  Stabilization device present and accurately applied.  Driveline dressing is being changed daily per sterile technique. EXTREMITIES:  Warm and dry, no cyanosis, clubbing, rash or edema  NEUROLOGIC:  Alert and oriented x 3.  No aphasia.  No dysarthria.  Affect pleasant.      Telemetry   NSR/ sinus tach 90s-low 100s    EKG    n/a  Labs   Basic Metabolic Panel: Recent Labs  Lab 11/13/19 1108  11/13/19 1108 11/14/19 0216 11/15/19 0342 11/16/19 0201  NA 135  --  136 130* 130*  K 3.7  --  3.8 4.3 4.1  CL 105  --  107 101 99  CO2 19*  --  20* 18* 21*  GLUCOSE 172*  --  105* 113* 109*  BUN 16  --  _0 CREATININE 1.14  --  0.87 1.34* 1.34*  CALCIUM 9.0   < > 8.5* 8.5* 8.6*   < > = values in this interval not displayed.    Liver Function Tests: Recent Labs  Lab 11/13/19 1108 11/16/19 0201  AST 286* 250*  ALT 163* 223*  ALKPHOS 131* 128*  BILITOT 1.9* 1.9*  PROT 7.7 7.7  ALBUMIN 3.0* 3.1*   No results for input(s): LIPASE, AMYLASE in the last 168 hours. No results for input(s): AMMONIA in the last 168 hours.  CBC: Recent Labs  Lab 11/13/19 1108 11/14/19 0216 11/15/19 0342 11/16/19 0201  WBC 8.9 7.1 8.2 8.0  HGB 9.5* 9.1* 9.3* 9.3*  HCT 31.4* 30.3* 30.8* 30.8*  MCV 85.3 84.9 86.0 85.6  PLT 197 192 187 196    INR: Recent Labs  Lab 11/13/19 1108 11/14/19 0216 11/15/19 0342 11/15/19 1001 11/16/19 0201  INR 1.9* 2.0* 2.9* 3.3* 3.4*    Other results:    Imaging   CT CHEST W CONTRAST  Result Date:  11/14/2019 CLINICAL DATA:  Soft tissue infection suspected, drainage at LVAD wire insertion site, assess for abscess EXAM: CT CHEST AND ABDOMEN WITH CONTRAST TECHNIQUE: Multidetector CT imaging of the chest and abdomen was performed following the standard protocol during bolus administration of intravenous contrast. CONTRAST:  75mL OMNIPAQUE IOHEXOL 300 MG/ML  SOLN COMPARISON:  08/16/2019 FINDINGS: CT CHEST FINDINGS Cardiovascular: Cardiomegaly. Status post median sternotomy and LVAD placement. No pericardial effusion. Mediastinum/Nodes: Prominent, nonspecific mediastinal lymph nodes. Thyroid gland, trachea, and esophagus demonstrate no significant findings. Lungs/Pleura: Paraseptal emphysema. Diffuse bilateral interlobular septal thickening. Bandlike scarring and or atelectasis throughout. Small bilateral pleural effusions. Musculoskeletal: No chest wall  mass or suspicious bone lesions identified. There is an elongated subcutaneous fluid collection overlying median sternotomy wound, measuring 13.7 x 10.1 x 2.6 cm (series 3, image 44, series 7, image 102). Smaller rounded fluid collection overlying the left pectoralis major measuring 2.9 cm, likely related to prior pacer placement (series 3, image 13). Bilateral gynecomastia. CT ABDOMEN FINDINGS Hepatobiliary: No solid liver abnormality is seen. Hepatic steatosis. No gallstones, gallbladder wall thickening, or biliary dilatation. Pancreas: Unremarkable. No pancreatic ductal dilatation or surrounding inflammatory changes. Spleen: Normal in size without significant abnormality. Adrenals/Urinary Tract: Adrenal glands are unremarkable. Kidneys are normal, without renal calculi, solid lesion, or hydronephrosis. Stomach/Bowel: Stomach is within normal limits. Appendix appears normal. No evidence of bowel wall thickening, distention, or inflammatory changes. Vascular/Lymphatic: No significant vascular findings are present. No enlarged abdominal lymph nodes. Other: There is a small, elongated fluid collection just inferior to the drive line in the superficial soft tissues of the left hemiabdomen, measuring approximately 7.8 cm in length and no greater than 9 mm in thickness (series 3, image 79, series 7, image 133). No abdominopelvic ascites. Musculoskeletal: No acute or significant osseous findings. IMPRESSION: 1. There is an elongated subcutaneous fluid collection overlying median sternotomy wound, measuring 13.7 x 10.1 x 2.6 cm. 2. Smaller rounded fluid collection overlying the left pectoralis major measuring 2.9 cm, likely related to prior pacer placement. 3. There is a small, elongated fluid collection just inferior to the drive line in the superficial soft tissues of the left hemiabdomen, measuring approximately 7.8 cm in length and no greater than 9 mm in thickness. 4. The presence or absence of infection in any of  the above described fluid collections is not established by CT. 5. Cardiomegaly with diffuse bilateral interlobular septal thickening and small bilateral pleural effusions, consistent with pulmonary edema. 6. Emphysema (ICD10-J43.9). 7. Hepatic steatosis. Electronically Signed   By: Alex  Bibbey M.D.   On: 11/14/2019 11:47   CT ABDOMEN W CONTRAST  Result Date: 11/14/2019 CLINICAL DATA:  Soft tissue infection suspected, drainage at LVAD wire insertion site, assess for abscess EXAM: CT CHEST AND ABDOMEN WITH CONTRAST TECHNIQUE: Multidetector CT imaging of the chest and abdomen was performed following the standard protocol during bolus administration of intravenous contrast. CONTRAST:  75mL OMNIPAQUE IOHEXOL 300 MG/ML  SOLN COMPARISON:  08/16/2019 FINDINGS: CT CHEST FINDINGS Cardiovascular: Cardiomegaly. Status post median sternotomy and LVAD placement. No pericardial effusion. Mediastinum/Nodes: Prominent, nonspecific mediastinal lymph nodes. Thyroid gland, trachea, and esophagus demonstrate no significant findings. Lungs/Pleura: Paraseptal emphysema. Diffuse bilateral interlobular septal thickening. Bandlike scarring and or atelectasis throughout. Small bilateral pleural effusions. Musculoskeletal: No chest wall mass or suspicious bone lesions identified. There is an elongated subcutaneous fluid collection overlying median sternotomy wound, measuring 13.7 x 10.1 x 2.6 cm (series 3, image 44, series 7, image 102). Smaller rounded fluid collection overlying the left pectoralis major   measuring 2.9 cm, likely related to prior pacer placement (series 3, image 13). Bilateral gynecomastia. CT ABDOMEN FINDINGS Hepatobiliary: No solid liver abnormality is seen. Hepatic steatosis. No gallstones, gallbladder wall thickening, or biliary dilatation. Pancreas: Unremarkable. No pancreatic ductal dilatation or surrounding inflammatory changes. Spleen: Normal in size without significant abnormality. Adrenals/Urinary Tract:  Adrenal glands are unremarkable. Kidneys are normal, without renal calculi, solid lesion, or hydronephrosis. Stomach/Bowel: Stomach is within normal limits. Appendix appears normal. No evidence of bowel wall thickening, distention, or inflammatory changes. Vascular/Lymphatic: No significant vascular findings are present. No enlarged abdominal lymph nodes. Other: There is a small, elongated fluid collection just inferior to the drive line in the superficial soft tissues of the left hemiabdomen, measuring approximately 7.8 cm in length and no greater than 9 mm in thickness (series 3, image 79, series 7, image 133). No abdominopelvic ascites. Musculoskeletal: No acute or significant osseous findings. IMPRESSION: 1. There is an elongated subcutaneous fluid collection overlying median sternotomy wound, measuring 13.7 x 10.1 x 2.6 cm. 2. Smaller rounded fluid collection overlying the left pectoralis major measuring 2.9 cm, likely related to prior pacer placement. 3. There is a small, elongated fluid collection just inferior to the drive line in the superficial soft tissues of the left hemiabdomen, measuring approximately 7.8 cm in length and no greater than 9 mm in thickness. 4. The presence or absence of infection in any of the above described fluid collections is not established by CT. 5. Cardiomegaly with diffuse bilateral interlobular septal thickening and small bilateral pleural effusions, consistent with pulmonary edema. 6. Emphysema (ICD10-J43.9). 7. Hepatic steatosis. Electronically Signed   By: Eddie Candle M.D.   On: 11/14/2019 11:47     Medications:     Scheduled Medications: . cephALEXin  500 mg Oral TID  . docusate sodium  100 mg Oral TID  . DULoxetine  60 mg Oral Daily  . gabapentin  300 mg Oral TID  . hydrALAZINE  50 mg Oral TID  . linaclotide  145 mcg Oral QODAY  . mouth rinse  15 mL Mouth Rinse BID  . oxyCODONE  10 mg Oral Q8H  . pantoprazole  40 mg Oral BID  . rifabutin  300 mg Oral  Daily  . sildenafil  20 mg Oral TID  . spironolactone  25 mg Oral Daily  . torsemide  20 mg Oral QODAY  . 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   Admitted VAD clinic with recurrent infection.    Assessment/Plan:    1. Respiratory Distress - On admit tachypneic w/ RR in the 40s. O2 sats 98% on RA  - SARS2 negative.  - UDS + benzo + tetrahydrocannabinol  - Improved today.   2. H/O RecurrentDL infection with subxiphoid abscess - s/p debridement and pec flap in 4/21 - remains onkeflexand rifabutin -has recurrent subxiphoid abscess s/p I&D by Dr. Prescott Gum in clinic 6/29 - Blood cultures 6/30 - NGTD.  - CT of chest and abdomen reviewed w/ Dr. Prescott Gum and he feels that the fluid collection noted on CT is the pec flap itself and not an abscess. IR to drain percutaneously.  - He is not a candidate for pump exchange given severe RV dysfunction if infection  getting worse.  3.Chronic Biventricular HF with HMIII LVAD  - EF 10% s/p HM-3 LVAD on 09/06/17 - Volume status stable.  Continue torsemide 20 mg every other day.  - Continue spiro + sildenafil.  - No bb with RV failure.  - RV severely HK   4. HM3 LVAD 09/06/2017. - VAD interrogated personally. Parameters stable. - LDH 557>388> 410>430  Follow daily.  - INR 3.4.  Goal 2.0-2.5. Continue to hold coumadin for now  - Off ASA with PUD.   5. Essential HTN -Stable today. MAPs 80  - Continue current regimen.   6. OSA - sleep study with very severe OSA (AHI 69/hr) - continues to refuse therapy. We have arranged f/u in sleep clinic several times but he has not been complaint  7. H/o GI bleed  - 08/2018 EGD completed and showed gastritis and  nonbleeding duodenal ulcers, colonoscopy with 5 polyps removed. Protonix was increased to BID.  - No recent GI bleeding - Hgb 9.3  - He will stay off aspirin for now.   8. Tobacco Abuse  - active smoker  - advised to quit   9. Elevated LFTs - in the setting of RV failure - AST 250 - ALT 223 - Alk Phos 128 - TB 1.9    I reviewed the LVAD parameters from today, and compared the results to the patient's prior recorded data.  No programming changes were made.  The LVAD is functioning within specified parameters.  The patient performs LVAD self-test daily.  LVAD interrogation was negative for any significant power changes, alarms or PI events/speed drops.  LVAD equipment check completed and is in good working order.  Back-up equipment present.   LVAD education done on emergency procedures and precautions and reviewed exit site care.  Length of Stay: 3  Brittainy Simmons, PA-C 11/16/2019, 8:56 AM  VAD Team --- VAD ISSUES ONLY--- Pager [phone] (7am - 7am)  Advanced Heart Failure Team  Pager [phone] (M-F; 7a - 4p)  Please contact [facility] for night-coverage after hours (4p -7a ) and weekends on [email or web address]  Patient seen and examined with the above-signed Advanced Practice Provider and/or Housestaff. I personally reviewed laboratory data, imaging studies and relevant notes. I independently examined the patient and formulated the important aspects of the plan. I have edited the note to reflect any of my changes or salient points. I have personally discussed the plan with the patient and/or family.  Feeling better today. No f/c. Breathing back to baseline. INR 3.3. VAD interrogated personally. Parameters stable. Had new spot pop up on chest.  Went to IR for possible I&D of chest fluid collection and u/s revealed no evidence of fluid collection. D/w Dr. Wagoner personally.   General:  NAD.  HEENT: normal  Neck: supple. JVP not elevated.  Carotids 2+ bilat; no bruits. No  lymphadenopathy or thryomegaly appreciated. Cor: LVAD hum.  Small pustule adjacent to previous superficial abscess Lungs: Clear. Abdomen: obese soft, nontender, non-distended. No hepatosplenomegaly. No bruits or masses. Good bowel sounds. Driveline site clean. Anchor in place.  Extremities: no cyanosis, clubbing, rash. Warm no edema  Neuro: alert & oriented x 3. No focal deficits. Moves all 4 without problem   Overall improved. Appreciate IR's assistance. He should be able to go home today once seen by Dr. Van Trigt and pustule drained. Continue Keflex. Will hold warfarin for 2 more days, Restart Monday. Recheck INR Tuesday.    , MD  6:32 PM      

## 2019-11-16 NOTE — Consult Note (Signed)
Chief Complaint: Patient was seen in consultation today for chest wall fluid collection  Referring Physician(s): Dr. Teressa Lower  Supervising Physician: Gilmer Mor  Patient Status: [facility] - In-pt  History of Present Illness: Jonathan Wood is a 49 y.o. male with history of chronic systolic heart failure due to NICM, EtOH abuse, smoker who is s/p LVAD placement 08/27/2017.  Patient was admitted 12/7-12/15/20 due to DL infection with cellulitis.  He has undergone I&D x6 with eventual pec muscle flap reconstruction.  He presented to his VAD clinic 11/12/19 with SOB and wheezing.  CT Chest shows: 1. There is an elongated subcutaneous fluid collection overlying median sternotomy wound, measuring 13.7 x 10.1 x 2.6 cm. 2. Smaller rounded fluid collection overlying the left pectoralis major measuring 2.9 cm, likely related to prior pacer placement. 3. There is a small, elongated fluid collection just inferior to the drive line in the superficial soft tissues of the left hemiabdomen, measuring approximately 7.8 cm in length and no greater than 9 mm in thickness. 4. The presence or absence of infection in any of the above described fluid collections is not established by CT. 5. Cardiomegaly with diffuse bilateral interlobular septal thickening and small bilateral pleural effusions, consistent with pulmonary edema. 6. Emphysema (ICD10-J43.9). 7. Hepatic steatosis.  IR consulted for aspiration and drainage of chest wall fluid collection.   Patient assessed in Radiology today.  He is alert and oriented.  States he has some fullness in his chest which is draining in small amounts along his incision. He has been NPO today.  Case reviewed and approved by Dr. Loreta Ave.   Past Medical History:  Diagnosis Date  . Asthma   . CHF (congestive heart failure) (HCC)    a. 09/2016: EF 20-25% with cath showing normal cors  . GERD (gastroesophageal reflux disease)   . History of hiatal hernia   .  LVAD (left ventricular assist device) present (HCC)   . OSA on CPAP 09/06/2018   Severe OSA with AHI 68/hr on CPAP at 12cm H2O    Past Surgical History:  Procedure Laterality Date  . APPLICATION OF A-CELL OF CHEST/ABDOMEN N/A 08/24/2019   Procedure: Application Of A-Cell Of Chest/Abdomen;  Surgeon: Kerin Perna, MD;  Location: [facility] OR;  Service: Thoracic;  Laterality: N/A;  . APPLICATION OF A-CELL OF CHEST/ABDOMEN N/A 09/05/2019   Procedure: Application Of A-Cell Of Chest/Abdomen;  Surgeon: Peggye Form, DO;  Location: [facility] OR;  Service: Plastics;  Laterality: N/A;  . APPLICATION OF A-CELL OF CHEST/ABDOMEN  09/03/2019   Procedure: Application Of A-Cell Of Chest/Abdomen;  Surgeon: Kerin Perna, MD;  Location: [facility] OR;  Service: Thoracic;;  . APPLICATION OF WOUND VAC N/A 08/22/2019   Procedure: Debridement, Irrigation and Packing of Abdominal Incision.;  Surgeon: Kerin Perna, MD;  Location: [facility] OR;  Service: Thoracic;  Laterality: N/A;  . APPLICATION OF WOUND VAC N/A 08/24/2019   Procedure: Irrigation and Debridement with WOUND VAC APPLICATION;  Surgeon: Kerin Perna, MD;  Location: [facility] OR;  Service: Thoracic;  Laterality: N/A;  . APPLICATION OF WOUND VAC N/A 08/30/2019   Procedure: APPLICATION OF WOUND VAC;  Surgeon: Kerin Perna, MD;  Location: [facility] OR;  Service: Thoracic;  Laterality: N/A;  . APPLICATION OF WOUND VAC N/A 09/03/2019   Procedure: WOUND VAC CHANGE;  Surgeon: Kerin Perna, MD;  Location: [facility] OR;  Service: Thoracic;  Laterality: N/A;  . BIOPSY  08/23/2018   Procedure: BIOPSY;  Surgeon: Lemar Lofty., MD;  Location: [facility] ENDOSCOPY;  Service: Gastroenterology;;  . COLONOSCOPY N/A 08/23/2018   Procedure: COLONOSCOPY;  Surgeon: Meridee Score Netty Starring., MD;  Location: [facility] ENDOSCOPY;  Service: Gastroenterology;  Laterality: N/A;  . ENTEROSCOPY N/A 08/23/2018   Procedure: ENTEROSCOPY;  Surgeon: Meridee Score Netty Starring., MD;  Location: [facility] ENDOSCOPY;  Service:  Gastroenterology;  Laterality: N/A;  . HEMOSTASIS CLIP PLACEMENT  08/23/2018   Procedure: HEMOSTASIS CLIP PLACEMENT;  Surgeon: Lemar Lofty., MD;  Location: [facility] ENDOSCOPY;  Service: Gastroenterology;;  . IABP INSERTION N/A 09/05/2017   Procedure: IABP INSERTION;  Surgeon: Dolores Patty, MD;  Location: [facility] INVASIVE CV LAB;  Service: Cardiovascular;  Laterality: N/A;  . INSERTION OF IMPLANTABLE LEFT VENTRICULAR ASSIST DEVICE N/A 09/06/2017   Procedure: INSERTION OF IMPLANTABLE LEFT VENTRICULAR ASSIST DEVICE - HM3;  Surgeon: Kerin Perna, MD;  Location: [facility] OR;  Service: Open Heart Surgery;  Laterality: N/A;  HM3  . MUSCLE FLAP CLOSURE N/A 09/05/2019   Procedure: MUSCLE FLAP CLOSURE;  Surgeon: Peggye Form, DO;  Location: [facility] OR;  Service: Plastics;  Laterality: N/A;  . NASAL FRACTURE SURGERY  1987  . POLYPECTOMY  08/23/2018   Procedure: POLYPECTOMY;  Surgeon: Mansouraty, Netty Starring., MD;  Location: [facility] ENDOSCOPY;  Service: Gastroenterology;;  . RIGHT HEART CATH N/A 08/30/2017   Procedure: RIGHT HEART CATH;  Surgeon: Dolores Patty, MD;  Location: [facility] INVASIVE CV LAB;  Service: Cardiovascular;  Laterality: N/A;  . RIGHT/LEFT HEART CATH AND CORONARY ANGIOGRAPHY N/A 10/08/2016   Procedure: Right/Left Heart Cath and Coronary Angiography;  Surgeon: Orpah Cobb, MD;  Location: [facility] INVASIVE CV LAB;  Service: Cardiovascular;  Laterality: N/A;  . STERNAL WOUND DEBRIDEMENT N/A 08/20/2019   Procedure: DEBRIDEMENT OF LVAD DRIVELINE TUNNEL;  Surgeon: Kerin Perna, MD;  Location: [facility] OR;  Service: Thoracic;  Laterality: N/A;  . STERNAL WOUND DEBRIDEMENT N/A 09/05/2019   Procedure: DEBRIDEMENT AND CLOSURE OF ABDOMINAL WOUND;  Surgeon: Peggye Form, DO;  Location: [facility] OR;  Service: Plastics;  Laterality: N/A;  . SUBMUCOSAL TATTOO INJECTION  08/23/2018   Procedure: SUBMUCOSAL TATTOO INJECTION;  Surgeon: Lemar Lofty., MD;  Location: [facility] ENDOSCOPY;  Service: Gastroenterology;;  .  TEE WITHOUT CARDIOVERSION N/A 09/06/2017   Procedure: TRANSESOPHAGEAL ECHOCARDIOGRAM (TEE);  Surgeon: Donata Clay, Theron Arista, MD;  Location: [facility] OR;  Service: Open Heart Surgery;  Laterality: N/A;  . WOUND DEBRIDEMENT N/A 08/30/2019   Procedure: Debridement Abdominal Wound;  Surgeon: Kerin Perna, MD;  Location: [facility] OR;  Service: Thoracic;  Laterality: N/A;    Allergies: Patient has no known allergies.  Medications: Prior to Admission medications   Medication Sig Start Date End Date Taking? Authorizing Provider  albuterol (PROAIR HFA) 108 (90 Base) MCG/ACT inhaler Inhale 2 puffs into the lungs every 6 (six) hours as needed for wheezing or shortness of breath. 11/13/19   Bensimhon, Bevelyn Buckles, MD  cephALEXin (KEFLEX) 500 MG capsule Take 1 capsule (500 mg total) by mouth 3 (three) times daily. 10/15/19   Cliffton Asters, MD  docusate sodium (COLACE) 100 MG capsule Take 1 capsule (100 mg total) by mouth 3 (three) times daily. 02/20/19   Bensimhon, Bevelyn Buckles, MD  DULoxetine (CYMBALTA) 60 MG capsule Take 1 capsule (60 mg total) by mouth daily. 10/16/19   Bensimhon, Bevelyn Buckles, MD  gabapentin (NEURONTIN) 300 MG capsule TAKE ONE CAPSULE BY MOUTH THREE TIMES DAILY Patient taking differently: Take 300 mg by mouth 3 (three) times daily.  06/07/19   Bensimhon, Bevelyn Buckles, MD  hydrALAZINE (APRESOLINE) 50 MG  tablet Take 1 tablet (50 mg total) by mouth 3 (three) times daily. 05/01/19   Robbie Lis M, PA-C  hydrOXYzine (ATARAX/VISTARIL) 25 MG tablet Take 1 tablet (25 mg total) by mouth at bedtime as needed for anxiety. 09/13/19   Clegg, Amy D, NP  linaclotide (LINZESS) 145 MCG CAPS capsule Take 1 capsule (145 mcg total) by mouth daily before breakfast. Patient taking differently: Take 145 mcg by mouth every other day.  06/11/19   Mansouraty, Netty Starring., MD  oxyCODONE 10 MG TABS Take 1 tablet (10 mg total) by mouth every 8 (eight) hours. 09/13/19   Clegg, Amy D, NP  pantoprazole (PROTONIX) 40 MG tablet Take 1 tablet (40 mg  total) by mouth 2 (two) times daily. 10/11/19   Bensimhon, Bevelyn Buckles, MD  rifabutin (MYCOBUTIN) 150 MG capsule Take 300 mg by mouth daily.    [provider]  senna-docusate (SENOKOT-S) 8.6-50 MG tablet Take 1 tablet by mouth 2 (two) times daily. 09/13/19   Clegg, Amy D, NP  sildenafil (REVATIO) 20 MG tablet TAKE 1 TABLET BY MOUTH THREE TIMES DAILY 10/04/19   Bensimhon, Bevelyn Buckles, MD  spironolactone (ALDACTONE) 25 MG tablet Take 1 tablet (25 mg total) by mouth daily. 09/14/19   Clegg, Amy D, NP  torsemide (DEMADEX) 20 MG tablet Take 1 tablet (20 mg total) by mouth every other day. May take extra if needed Patient taking differently: Take 20 mg by mouth See admin instructions. Every other day.  May an additional 20mg  as needed for edema. 09/14/19   Clegg, Amy D, NP  traMADol (ULTRAM) 50 MG tablet Take 2 tablets (100 mg total) by mouth every 6 (six) hours as needed for moderate pain. 09/13/19   Tonye Becket D, NP  warfarin (COUMADIN) 5 MG tablet Take 2.5 mg (0.5 tab) every Monday and 5 mg (1 tab) all other days or as directed by Heart Failure clinic Patient taking differently: Take warfarin 5 mg (1 tab) all days or as directed by Heart Failure clinic 10/09/19   Bensimhon, Bevelyn Buckles, MD  zinc sulfate 220 (50 Zn) MG capsule Take 1 capsule (220 mg total) by mouth daily. 09/14/19   Tonye Becket D, NP     Family History  Problem Relation Age of Onset  . Hypertension Father   . Colon cancer Neg Hx   . Esophageal cancer Neg Hx   . Inflammatory bowel disease Neg Hx   . Liver disease Neg Hx   . Pancreatic cancer Neg Hx   . Rectal cancer Neg Hx   . Stomach cancer Neg Hx   . Diabetes Neg Hx     Social History   Socioeconomic History  . Marital status: Legally Separated    Spouse name: Not on file  . Number of children: 3  . Years of education: Not on file  . Highest education level: Not on file  Occupational History  . Not on file  Tobacco Use  . Smoking status: Former Smoker    Packs/day: 0.50     Years: 25.00    Pack years: 12.50    Types: Cigarettes  . Smokeless tobacco: Never Used  . Tobacco comment: quit 08-16-2019  Vaping Use  . Vaping Use: Never used  Substance and Sexual Activity  . Alcohol use: Not Currently    Comment: occas  . Drug use: Not Currently  . Sexual activity: Yes  Other Topics Concern  . Not on file  Social History Narrative   Lives at home with  his three children (8, 12, and 45 years of age).    Social Determinants of Health   Financial Resource Strain:   . Difficulty of Paying Living Expenses:   Food Insecurity:   . Worried About Programme researcher, broadcasting/film/video in the Last Year:   . Barista in the Last Year:   Transportation Needs:   . Freight forwarder (Medical):   Marland Kitchen Lack of Transportation (Non-Medical):   Physical Activity:   . Days of Exercise per Week:   . Minutes of Exercise per Session:   Stress:   . Feeling of Stress :   Social Connections:   . Frequency of Communication with Friends and Family:   . Frequency of Social Gatherings with Friends and Family:   . Attends Religious Services:   . Active Member of Clubs or Organizations:   . Attends Banker Meetings:   Marland Kitchen Marital Status:      Review of Systems: A 12 point ROS discussed and pertinent positives are indicated in the HPI above.  All other systems are negative.  Review of Systems  Constitutional: Negative for fatigue and fever.  Respiratory: Negative for cough and shortness of breath.   Cardiovascular: Negative for chest pain.  Gastrointestinal: Negative for abdominal pain.  Genitourinary: Negative for dysuria.  Musculoskeletal: Negative for back pain.  Psychiatric/Behavioral: Negative for behavioral problems and confusion.    Vital Signs: BP 97/82 (BP Location: Right Arm) Comment: MAP 89  Pulse (!) 109   Temp 97.6 F (36.4 C) (Oral)   Resp 18   Ht  (1.88 m)   Wt 213 lb 3 oz (96.7 kg)   SpO2 98%   BMI 27.37 kg/m   Physical Exam Vitals and  nursing note reviewed.  Constitutional:      Appearance: Normal appearance.  HENT:     Mouth/Throat:     Mouth: Mucous membranes are moist.     Pharynx: Oropharynx is clear.  Cardiovascular:     Rate and Rhythm: Normal rate and regular rhythm.     Comments: LVAD in place Pulmonary:     Effort: Pulmonary effort is normal.     Breath sounds: Normal breath sounds.  Musculoskeletal:     Comments: Chest wall with dressing over incision/surgical scar  Skin:    General: Skin is warm and dry.  Neurological:     General: No focal deficit present.     Mental Status: He is alert and oriented to person, place, and time. Mental status is at baseline.  Psychiatric:        Mood and Affect: Mood normal.        Behavior: Behavior normal.        Thought Content: Thought content normal.        Judgment: Judgment normal.      MD Evaluation Airway: WNL Heart: Other (comments) Heart  comments: LVAD in place Abdomen: WNL Chest/ Lungs: WNL ASA  Classification: 3 Mallampati/Airway Score: Two   Imaging: DG Chest 2 View  Result Date: 11/13/2019 CLINICAL DATA:  Tachypnea. 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 EXAM: CHEST - 2 VIEW COMPARISON:  10/22/2019 FINDINGS: LVAD device in place. Previous median sternotomy. Cardiac enlargement, stable. Pulmonary vascular congestion and small left pleural effusion unchanged. No airspace densities. IMPRESSION: Cardiac enlargement, pulmonary vascular congestion and small left pleural effusion. Electronically Signed   By: Signa Kell M.D.   On: 11/13/2019 11:45   DG Chest 2 View  Result  Date: 10/22/2019 CLINICAL DATA:  Sternal incision infection with drainage. EXAM: CHEST - 2 VIEW COMPARISON:  09/13/2019 FINDINGS: There is an LVAD device in place. Previous median sternotomy. Sternotomy wires appear intact. Mild presternal soft tissue swelling identified. The heart size is enlarged. Small left pleural effusion. No airspace  densities. IMPRESSION: 1. Small left pleural effusion. 2. Mild presternal soft tissue swelling. Electronically Signed   By: Signa Kell M.D.   On: 10/22/2019 10:39   CT CHEST W CONTRAST  Result Date: 11/14/2019 CLINICAL DATA:  Soft tissue infection suspected, drainage at LVAD wire insertion site, assess for abscess EXAM: CT CHEST AND ABDOMEN WITH CONTRAST TECHNIQUE: Multidetector CT imaging of the chest and abdomen was performed following the standard protocol during bolus administration of intravenous contrast. CONTRAST:  71mL OMNIPAQUE IOHEXOL 300 MG/ML  SOLN COMPARISON:  08/16/2019 FINDINGS: CT CHEST FINDINGS Cardiovascular: Cardiomegaly. Status post median sternotomy and LVAD placement. No pericardial effusion. Mediastinum/Nodes: Prominent, nonspecific mediastinal lymph nodes. Thyroid gland, trachea, and esophagus demonstrate no significant findings. Lungs/Pleura: Paraseptal emphysema. Diffuse bilateral interlobular septal thickening. Bandlike scarring and or atelectasis throughout. Small bilateral pleural effusions. Musculoskeletal: No chest wall mass or suspicious bone lesions identified. There is an elongated subcutaneous fluid collection overlying median sternotomy wound, measuring 13.7 x 10.1 x 2.6 cm (series 3, image 44, series 7, image 102). Smaller rounded fluid collection overlying the left pectoralis major measuring 2.9 cm, likely related to prior pacer placement (series 3, image 13). Bilateral gynecomastia. CT ABDOMEN FINDINGS Hepatobiliary: No solid liver abnormality is seen. Hepatic steatosis. No gallstones, gallbladder wall thickening, or biliary dilatation. Pancreas: Unremarkable. No pancreatic ductal dilatation or surrounding inflammatory changes. Spleen: Normal in size without significant abnormality. Adrenals/Urinary Tract: Adrenal glands are unremarkable. Kidneys are normal, without renal calculi, solid lesion, or hydronephrosis. Stomach/Bowel: Stomach is within normal limits. Appendix  appears normal. No evidence of bowel wall thickening, distention, or inflammatory changes. Vascular/Lymphatic: No significant vascular findings are present. No enlarged abdominal lymph nodes. Other: There is a small, elongated fluid collection just inferior to the drive line in the superficial soft tissues of the left hemiabdomen, measuring approximately 7.8 cm in length and no greater than 9 mm in thickness (series 3, image 79, series 7, image 133). No abdominopelvic ascites. Musculoskeletal: No acute or significant osseous findings. IMPRESSION: 1. There is an elongated subcutaneous fluid collection overlying median sternotomy wound, measuring 13.7 x 10.1 x 2.6 cm. 2. Smaller rounded fluid collection overlying the left pectoralis major measuring 2.9 cm, likely related to prior pacer placement. 3. There is a small, elongated fluid collection just inferior to the drive line in the superficial soft tissues of the left hemiabdomen, measuring approximately 7.8 cm in length and no greater than 9 mm in thickness. 4. The presence or absence of infection in any of the above described fluid collections is not established by CT. 5. Cardiomegaly with diffuse bilateral interlobular septal thickening and small bilateral pleural effusions, consistent with pulmonary edema. 6. Emphysema (ICD10-J43.9). 7. Hepatic steatosis. Electronically Signed   By: Lauralyn Primes M.D.   On: 11/14/2019 11:47   CT Chest W Contrast  Result Date: 10/24/2019 CLINICAL DATA:  Chest pain with sternal incision infection. Left ventricular assist device. EXAM: CT CHEST WITH CONTRAST TECHNIQUE: Multidetector CT imaging of the chest was performed during intravenous contrast administration. CONTRAST:  29mL OMNIPAQUE IOHEXOL 300 MG/ML  SOLN COMPARISON:  Chest CT August 28, 2017; chest radiograph October 22, 2019 FINDINGS: Cardiovascular: There is no evident thoracic aortic aneurysm or dissection. The  visualized great vessels appear unremarkable. There is aortic  atherosclerosis. There is a left ventricular assist device present. Grafts appear intact throughout this area. No major vessel pulmonary embolus appreciable. No pericardial effusion or pericardial thickening. Mediastinum/Nodes: Thyroid appears unremarkable. There are subcentimeter lymph nodes without adenopathy appreciable. No esophageal lesions are evident. Lungs/Pleura: There is a left pleural effusion. There is atelectatic change in each lower lung region and in the lingula inferiorly. There are bullae in the upper lobes, larger on the left than on the right, stable. Upper Abdomen: The visualized upper abdominal structures appear unremarkable. Musculoskeletal: There is evidence of median sternotomy. No bony destruction is appreciable in the sternum. There is diffuse soft tissue thickening and fluid tracking anterior to the sternum in the chest wall at the midline and slightly to the left at the level of the drive line consistent with infection. This area inflammation/infection measures 9.3 x 2.9 cm. A few foci of calcification are noted in this area. There is no air-fluid level or obvious air within this structure. There is overlying soft tissue cellulitis. In addition, there is an apparent mildly complex fluid collection arising in the left pectoralis muscle anteriorly on the left measuring 4.8 x 2.9 cm. Suspect a second focus of infection in this area. No air is seen within this structure. Elsewhere, bony structures appear intact without destruction. No blastic or lytic bone lesions. No other intramuscular lesions are evident. There is a degree of gynecomastia, more notable on the left than on the right. IMPRESSION: 1. There is extensive fluid and inflammatory change in the anterior chest wall at the level of the drive line for the left ventricular assist device at the level of the mid sternum which is present in the midline and slightly to the left measuring 9.3 x 2.9 cm. A fluid collection most likely. No  well-defined abscess. 2. Apparent complex fluid involving the anterior aspect of the pectoralis major muscle on the left. This collection measures 4.8 x 2.9 cm and likely represents a second focus of infection. Atypical neoplasm could arise from the pectoralis muscle, although in this clinical setting, infection is felt to be significant more likely. This fluid collection is better defined than the collection in the midline and slightly to the left chest wall region. No air is seen in this structure. 3. Status post left ventricular assist device present. The connecting lines and grafts appear grossly intact and patent. 4. Foci of aortic atherosclerosis no aneurysm or dissection evident. No pulmonary embolus appreciable. 5. Fairly small left pleural effusion. Areas of atelectatic change in the lower lung regions. No consolidation. Stable upper lobe bullous disease. 6.  No appreciable adenopathy. 7.  Gynecomastia. Aortic Atherosclerosis (ICD10-I70.0). These results will be called to the ordering clinician or representative by the Radiologist Assistant, and communication documented in the PACS or [employer]. Electronically Signed   By: Bretta Bang III M.D.   On: 10/24/2019 11:52   CT ABDOMEN W CONTRAST  Result Date: 11/14/2019 CLINICAL DATA:  Soft tissue infection suspected, drainage at LVAD wire insertion site, assess for abscess EXAM: CT CHEST AND ABDOMEN WITH CONTRAST TECHNIQUE: Multidetector CT imaging of the chest and abdomen was performed following the standard protocol during bolus administration of intravenous contrast. CONTRAST:  42mL OMNIPAQUE IOHEXOL 300 MG/ML  SOLN COMPARISON:  08/16/2019 FINDINGS: CT CHEST FINDINGS Cardiovascular: Cardiomegaly. Status post median sternotomy and LVAD placement. No pericardial effusion. Mediastinum/Nodes: Prominent, nonspecific mediastinal lymph nodes. Thyroid gland, trachea, and esophagus demonstrate no significant findings. Lungs/Pleura: Paraseptal  emphysema. Diffuse bilateral interlobular septal thickening. Bandlike scarring and or atelectasis throughout. Small bilateral pleural effusions. Musculoskeletal: No chest wall mass or suspicious bone lesions identified. There is an elongated subcutaneous fluid collection overlying median sternotomy wound, measuring 13.7 x 10.1 x 2.6 cm (series 3, image 44, series 7, image 102). Smaller rounded fluid collection overlying the left pectoralis major measuring 2.9 cm, likely related to prior pacer placement (series 3, image 13). Bilateral gynecomastia. CT ABDOMEN FINDINGS Hepatobiliary: No solid liver abnormality is seen. Hepatic steatosis. No gallstones, gallbladder wall thickening, or biliary dilatation. Pancreas: Unremarkable. No pancreatic ductal dilatation or surrounding inflammatory changes. Spleen: Normal in size without significant abnormality. Adrenals/Urinary Tract: Adrenal glands are unremarkable. Kidneys are normal, without renal calculi, solid lesion, or hydronephrosis. Stomach/Bowel: Stomach is within normal limits. Appendix appears normal. No evidence of bowel wall thickening, distention, or inflammatory changes. Vascular/Lymphatic: No significant vascular findings are present. No enlarged abdominal lymph nodes. Other: There is a small, elongated fluid collection just inferior to the drive line in the superficial soft tissues of the left hemiabdomen, measuring approximately 7.8 cm in length and no greater than 9 mm in thickness (series 3, image 79, series 7, image 133). No abdominopelvic ascites. Musculoskeletal: No acute or significant osseous findings. IMPRESSION: 1. There is an elongated subcutaneous fluid collection overlying median sternotomy wound, measuring 13.7 x 10.1 x 2.6 cm. 2. Smaller rounded fluid collection overlying the left pectoralis major measuring 2.9 cm, likely related to prior pacer placement. 3. There is a small, elongated fluid collection just inferior to the drive line in the  superficial soft tissues of the left hemiabdomen, measuring approximately 7.8 cm in length and no greater than 9 mm in thickness. 4. The presence or absence of infection in any of the above described fluid collections is not established by CT. 5. Cardiomegaly with diffuse bilateral interlobular septal thickening and small bilateral pleural effusions, consistent with pulmonary edema. 6. Emphysema (ICD10-J43.9). 7. Hepatic steatosis. Electronically Signed   By: Lauralyn Primes M.D.   On: 11/14/2019 11:47    Labs:  CBC: Recent Labs    11/13/19 1108 11/14/19 0216 11/15/19 0342 11/16/19 0201  WBC 8.9 7.1 8.2 8.0  HGB 9.5* 9.1* 9.3* 9.3*  HCT 31.4* 30.3* 30.8* 30.8*  PLT 197 192 187 196    COAGS: Recent Labs    11/14/19 0216 11/15/19 0342 11/15/19 1001 11/16/19 0201  INR 2.0* 2.9* 3.3* 3.4*    BMP: Recent Labs    11/13/19 1108 11/14/19 0216 11/15/19 0342 11/16/19 0201  NA 135 136 130* 130*  K 3.7 3.8 4.3 4.1  CL 105 107 101 99  CO2 19* 20* 18* 21*  GLUCOSE 172* 105* 113* 109*  BUN CALCIUM 9.0 8.5* 8.5* 8.6*  CREATININE 1.14 0.87 1.34* 1.34*  GFRNONAA >60 >60 >60 >60  GFRAA >60 >60 >60 >60    LIVER FUNCTION TESTS: Recent Labs    10/22/19 0901 11/02/19 1201 11/13/19 1108 11/16/19 0201  BILITOT 0.6 0.9 1.9* 1.9*  AST 21 23 286* 250*  ALT 10 10 163* 223*  ALKPHOS 154* 155* 131* 128*  PROT 8.1 7.6 7.7 7.7  ALBUMIN 3.1* 3.2* 3.0* 3.1*    TUMOR MARKERS: No results for input(s): AFPTM, CEA, CA199, CHROMGRNA in the last 8760 hours.  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  He remains on Keflex and rifabitun.  Case reviewed by Dr. Loreta Ave who  has discussed with Dr. Teressa Lower.  Plan made for US-guided fluid aspiration and drainage.  NPO.  INR 3.4 on coumadin, related to LVAD.  Risks and benefits discussed with the patient including bleeding, infection,  damage to adjacent structures, and sepsis.  All of the patient's questions were answered, patient is agreeable to proceed. Consent signed and in chart.  Thank you for this interesting consult.  I greatly enjoyed meeting TERRILL ALPERIN and look forward to participating in their care.  A copy of this report was sent to the requesting provider on this date.  Electronically Signed: Hoyt Koch, PA 11/16/2019, 10:25 AM   I spent a total of 40 Minutes    in face to face in clinical consultation, greater than 50% of which was counseling/coordinating care for chest wall fluid collection.

## 2019-11-16 NOTE — Sedation Documentation (Signed)
Awaiting transportation.

## 2019-11-16 NOTE — Progress Notes (Signed)
Dr Donata Clay did incision and drainage on mid .abdomen , tolerated well. Mother video taped on how to do the dressing  Change which she has to do every day. Expressed understanding  On how to do it. As per MD may go home after 30 min. bedrest at 7:30.

## 2019-11-16 NOTE — Progress Notes (Signed)
Patient discharged home with all belongings, including LVAD equipment, accompanied by family. All discharge instructions given.

## 2019-11-16 NOTE — Progress Notes (Signed)
LVAD Coordinator Rounding Note:  Admitted 11/13/19 by Dr. [name] due to shortness of breath, respiratory distress, and sternal wound management of subxiphoid abscess.   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 Laneta Simmers under Destination Therapy criteria.  Pt sitting up in bed. States he is "very tired" from not sleeping well. Reports he is using his CPAP. Wants to go home. Annice Pih in to see patient to discuss psych referral and assistance with other social issues.   Accompanied patient to IR for possible drainage of chest wall fluid accumulation seen on CT scan. Ultrasound completed- no fluid present to drain. Procedure cancelled.   Plan for possible discharge home this evening.   Vital signs: Temp: 97.6 HR: 100 Auto BP: 103/78 (87) Doppler: not done Sat: 99% on RA Wt: 210>213.8>213.1 lbs   LVAD interrogation reveals:  Speed: 5600 Flow: 4.6 Power:  4.2w PI: 3.8 Alarms: none Events: none Hematocrit: 30  Fixed speed: 5600  Low speed limit: 5300  Drive Line:  Left abdominal drive line dressing C/D/I with anchor intact. Weekly dressing changes per BS nurse. Dressing change due 11/20/19.  Sternal wound: Existing dressing clean, fry, intact. Wet to dry dressing changes BID per bedside RN.   Labs:  LDH trend: 388>410>430  INR trend: 2.0>2.9>3.3>3.4  WBC: 7.1>8.2  AST/ALT: 286/163 > 250/223  Infection: - Subxiphoid wound culture 11/13/19 >> moderate WBCs present, report pending - Urine culture 11/13/19 >> NGTD  Anticoagulation Plan: -INR Goal: 2.0 - 2.5  -ASA Dose: none  Adverse Events: - driveline cx 04/23/19>> staph aureus - Admitted for sternal/drive line infection. OR for debridement 08/20/19- + staph aureus wound cultures; further irrigation/debridements done 08/22/19, 08/24/19, 08/30/19, and 09/03/19. Debridement and closure of sternal wound with muscle flap 09/05/19.  - Admitted 11/13/19 for respiratory distress and wound management of subxiphoid abscess. UDS + THC & benzos.     Plan/Recommendations:  1. Call VAD pager if any VAD equipment or drive line issues. 2. Weekly drive line dressing changes by bedside nurse. Next dressing change 11/20/19.   Alyce Pagan RN VAD Coordinator  Office: [phone]  24/7 Pager: [phone]

## 2019-11-16 NOTE — Progress Notes (Signed)
Transported to IR by bed accompanied by L VAD coordinator and back after few min. Stable.. dressing to upper abd. changed with wet to dry dressing tolerated well.

## 2019-11-16 NOTE — Plan of Care (Signed)
  Problem: Education: Goal: Knowledge of General Education information will improve Description: Including pain rating scale, medication(s)/side effects and non-pharmacologic comfort measures Outcome: Progressing   Problem: Health Behavior/Discharge Planning: Goal: Ability to manage health-related needs will improve Outcome: Progressing   Problem: Clinical Measurements: Goal: Ability to maintain clinical measurements within normal limits will improve Outcome: Progressing Goal: Will remain free from infection Outcome: Progressing Goal: Respiratory complications will improve Outcome: Progressing Goal: Cardiovascular complication will be avoided Outcome: Progressing   Problem: Activity: Goal: Risk for activity intolerance will decrease Outcome: Progressing   

## 2019-11-16 NOTE — Sedation Documentation (Signed)
Transported back to 2C15 with Revonda Standard LVAD nurse and transporter.Report was given to [facility] on [facility]

## 2019-11-16 NOTE — Progress Notes (Signed)
Patient asked to see CSW at bedside. Patient is well known to CSW from outpatient VAD clinic. Patient is requesting a psych referral as he states "I have a lot going on". CSW and patient spoke about his concerns and he shared multiple family issues and challenges rasing children as a single parent, lack of disability approval (no income) and admitted to substance use. CSW discussed referral to outpatient psych as well as referral to [facility] integrated care program through their community paramedicine to assist with support and compliance. Patient is agreeable to plan and CSW will refer for support services once discharged from the hospital. CSW continues to follow through outpatient VAD clinic. Lasandra Beech, LCSW, CCSW-MCS [phone]

## 2019-11-19 LAB — CULTURE, BLOOD (ROUTINE X 2)
Culture: NO GROWTH
Culture: NO GROWTH
Special Requests: ADEQUATE
Special Requests: ADEQUATE

## 2019-11-20 ENCOUNTER — Ambulatory Visit (HOSPITAL_COMMUNITY): Payer: Self-pay | Admitting: Pharmacist

## 2019-11-20 ENCOUNTER — Ambulatory Visit (HOSPITAL_COMMUNITY)
Admit: 2019-11-20 | Discharge: 2019-11-20 | Disposition: A | Discharge status: Home / Self Care | Payer: Medicare Other | Source: Ambulatory Visit | Attending: Cardiology | Admitting: Cardiology

## 2019-11-20 ENCOUNTER — Other Ambulatory Visit: Payer: Self-pay

## 2019-11-20 DIAGNOSIS — Z95811 Presence of heart assist device: Secondary | ICD-10-CM

## 2019-11-20 DIAGNOSIS — Z7901 Long term (current) use of anticoagulants: Secondary | ICD-10-CM | POA: Diagnosis not present

## 2019-11-20 LAB — PROTIME-INR
INR: 5 (ref 0.8–1.2)
Prothrombin Time: 45.2 seconds — ABNORMAL HIGH (ref 11.4–15.2)

## 2019-11-20 NOTE — Progress Notes (Signed)
CSW met with patient in the clinic. Patient states he was discharged on Thursday of last week and managing at home. He states his phone is dead and requested assistance with phone minutes. CSW provided [employer] card to obtain minute card. Patient still in agreement to pursue psych appointment and referral to [facility]. CSW working on referrals and will contact patient when completed. Patient grateful for the assistance. [name], [place], [place]

## 2019-11-20 NOTE — Addendum Note (Signed)
Encounter addended by: Marcy Siren, LCSW on: 11/20/2019 2:20 PM  Actions taken: Clinical Note Signed

## 2019-11-20 NOTE — Progress Notes (Signed)
LVAD INR 

## 2019-11-22 ENCOUNTER — Other Ambulatory Visit (HOSPITAL_COMMUNITY): Payer: Self-pay | Admitting: *Deleted

## 2019-11-22 DIAGNOSIS — Z95811 Presence of heart assist device: Secondary | ICD-10-CM

## 2019-11-22 DIAGNOSIS — I5022 Chronic systolic (congestive) heart failure: Secondary | ICD-10-CM

## 2019-11-22 DIAGNOSIS — T827XXA Infection and inflammatory reaction due to other cardiac and vascular devices, implants and grafts, initial encounter: Secondary | ICD-10-CM

## 2019-11-22 DIAGNOSIS — Z7901 Long term (current) use of anticoagulants: Secondary | ICD-10-CM

## 2019-11-23 ENCOUNTER — Encounter (HOSPITAL_COMMUNITY): Payer: Self-pay

## 2019-11-23 ENCOUNTER — Ambulatory Visit (HOSPITAL_COMMUNITY): Payer: Self-pay | Admitting: Pharmacist

## 2019-11-23 ENCOUNTER — Other Ambulatory Visit (HOSPITAL_COMMUNITY): Payer: Self-pay | Admitting: *Deleted

## 2019-11-23 ENCOUNTER — Other Ambulatory Visit: Payer: Self-pay

## 2019-11-23 ENCOUNTER — Ambulatory Visit (HOSPITAL_COMMUNITY)
Admit: 2019-11-23 | Discharge: 2019-11-23 | Disposition: A | Discharge status: Home / Self Care | Payer: Medicare Other | Attending: Cardiology | Admitting: Cardiology

## 2019-11-23 DIAGNOSIS — J45909 Unspecified asthma, uncomplicated: Secondary | ICD-10-CM | POA: Insufficient documentation

## 2019-11-23 DIAGNOSIS — Z8249 Family history of ischemic heart disease and other diseases of the circulatory system: Secondary | ICD-10-CM | POA: Insufficient documentation

## 2019-11-23 DIAGNOSIS — K219 Gastro-esophageal reflux disease without esophagitis: Secondary | ICD-10-CM | POA: Insufficient documentation

## 2019-11-23 DIAGNOSIS — Z95811 Presence of heart assist device: Secondary | ICD-10-CM | POA: Insufficient documentation

## 2019-11-23 DIAGNOSIS — Z8711 Personal history of peptic ulcer disease: Secondary | ICD-10-CM | POA: Diagnosis not present

## 2019-11-23 DIAGNOSIS — F329 Major depressive disorder, single episode, unspecified: Secondary | ICD-10-CM | POA: Insufficient documentation

## 2019-11-23 DIAGNOSIS — I5022 Chronic systolic (congestive) heart failure: Secondary | ICD-10-CM | POA: Diagnosis present

## 2019-11-23 DIAGNOSIS — Z7901 Long term (current) use of anticoagulants: Secondary | ICD-10-CM | POA: Diagnosis not present

## 2019-11-23 DIAGNOSIS — F17210 Nicotine dependence, cigarettes, uncomplicated: Secondary | ICD-10-CM | POA: Diagnosis not present

## 2019-11-23 DIAGNOSIS — G4733 Obstructive sleep apnea (adult) (pediatric): Secondary | ICD-10-CM | POA: Insufficient documentation

## 2019-11-23 DIAGNOSIS — I5043 Acute on chronic combined systolic (congestive) and diastolic (congestive) heart failure: Secondary | ICD-10-CM | POA: Diagnosis not present

## 2019-11-23 DIAGNOSIS — Z79899 Other long term (current) drug therapy: Secondary | ICD-10-CM | POA: Insufficient documentation

## 2019-11-23 DIAGNOSIS — I110 Hypertensive heart disease with heart failure: Secondary | ICD-10-CM | POA: Insufficient documentation

## 2019-11-23 LAB — CBC
HCT: 33.7 % — ABNORMAL LOW (ref 39.0–52.0)
Hemoglobin: 9.9 g/dL — ABNORMAL LOW (ref 13.0–17.0)
MCH: 24.3 pg — ABNORMAL LOW (ref 26.0–34.0)
MCHC: 29.4 g/dL — ABNORMAL LOW (ref 30.0–36.0)
MCV: 82.8 fL (ref 80.0–100.0)
Platelets: 209 10*3/uL (ref 150–400)
RBC: 4.07 MIL/uL — ABNORMAL LOW (ref 4.22–5.81)
RDW: 17.2 % — ABNORMAL HIGH (ref 11.5–15.5)
WBC: 7.1 10*3/uL (ref 4.0–10.5)
nRBC: 2.2 % — ABNORMAL HIGH (ref 0.0–0.2)

## 2019-11-23 LAB — COMPREHENSIVE METABOLIC PANEL
ALT: 176 U/L — ABNORMAL HIGH (ref 0–44)
AST: 115 U/L — ABNORMAL HIGH (ref 15–41)
Albumin: 3.1 g/dL — ABNORMAL LOW (ref 3.5–5.0)
Alkaline Phosphatase: 123 U/L (ref 38–126)
Anion gap: 13 (ref 5–15)
BUN: 20 mg/dL (ref 6–20)
CO2: 18 mmol/L — ABNORMAL LOW (ref 22–32)
Calcium: 8.9 mg/dL (ref 8.9–10.3)
Chloride: 104 mmol/L (ref 98–111)
Creatinine, Ser: 1.21 mg/dL (ref 0.61–1.24)
GFR calc Af Amer: >60 mL/min (ref >60)
GFR calc non Af Amer: >60 mL/min (ref >60)
Glucose, Bld: 149 mg/dL — ABNORMAL HIGH (ref 70–99)
Potassium: 4.4 mmol/L (ref 3.5–5.1)
Sodium: 135 mmol/L (ref 135–145)
Total Bilirubin: 3.8 mg/dL — ABNORMAL HIGH (ref 0.3–1.2)
Total Protein: 8.1 g/dL (ref 6.5–8.1)

## 2019-11-23 LAB — LACTATE DEHYDROGENASE: LDH: 345 U/L — ABNORMAL HIGH (ref 98–192)

## 2019-11-23 LAB — PROTIME-INR
INR: 2.5 — ABNORMAL HIGH (ref 0.8–1.2)
Prothrombin Time: 26.1 seconds — ABNORMAL HIGH (ref 11.4–15.2)

## 2019-11-23 MED ORDER — SILDENAFIL CITRATE 20 MG PO TABS: 20.0000 mg | ORAL_TABLET | Freq: Three times a day (TID) | ORAL | 11 refills | Status: DC

## 2019-11-23 MED ORDER — TORSEMIDE 20 MG PO TABS: 20.0000 mg | ORAL_TABLET | ORAL | 3 refills | Status: DC

## 2019-11-23 NOTE — Patient Instructions (Signed)
1. Take Torsemide 20 mg every Monday and Friday. Take one when you get home today 2. Pick up Sildenafil on your way home! This is important for your right heart health!  3. Coumadin dosing per Leotis Shames PharmD 4. Return to clinic in 1 week for drive line dressing change 5. Return to clinic in 2 months for full VAD visit 6. STOP SMOKING AND DRINKING!

## 2019-11-23 NOTE — Progress Notes (Signed)
CSW met with patient in the clinic. Patient had agreed to referrals to psych and to the [facility] while in the hospital. CSW contacted Dr Montel Culver (psych) to make a virtual appointment for patient to be held on August 17 at [place] also completed referral paperwork for [facility] with patient and faxed to their office. Patient is agreement with follow up plan. CSW continues to follow for supportive intervention and collaboration with community resources. [name], [place], [place]

## 2019-11-23 NOTE — Progress Notes (Addendum)
Patient presents for hospital d/c f/u in VAD clinic today with his mom. Denies any issues with his VAD or alarms.   Pt arrived in w/c with his mother pushing him. When asked why he is in w/c he reports he is short of breath with minimal activity. Reports it takes him 30 minutes to recover from walking short distances. Increased work of breathing noted while sitting on clinic stretcher. Has frequent dry cough. Reports he has not taken his Sildenafil "in over a month" because the "pharmacy did not have any refills." He did not notify anyone of issues picking up medications. Prescription sent in to [facility], and patient instructed to pick up on his way home. He verbalized understanding.   Weight is up 5lbs today. He states "I feel like I need some Torsemide." Has not taking any PRN doses at home. Per Dr [name] will have him take Torsemide 20 mg on Mondays and Fridays.   Does report 2 falls over the weekend due to tripping over his equipment and leg weakness. Denies injury or hitting his head.   Reports he is smoking 2-3 cigarettes daily. Encouraged complete cessation. Reports he has not drank alcohol in 3 days. Encouraged complete cessation. Liver enzymes improving since last check.    Had long discussion with pt and his mother regarding diet, medication compliance, exercise, smoking/ ETOH cessation, and his right heart failure. Pt tearful stating he is "disgusted with himself" and knows he "needs to do better." Annice Pih in to see patient to offer mental health and paramedicine resources. See her separate note for documentation.   Vital Signs: Temp:  Doppler Pressure: 110 Automatc BP: 121/69 (104) HR: 87 SPO2: UTO % RA  Weight: 218.8  lbs w/ eqt Last weight: 213 lbs w/o equip   VAD Indication: Destination Therapy   VAD interrogation & Equipment Management: Speed: 5600 Flow: 4.1 Power: 4.1w    PI: 4.3  Alarms: multiple  low voltage advisories. NO EXTERNAL POWER on 11/18/19. Patient  thinks he accidentally unplugged from the wall.  Events: rare  Primary Controller:  Replace back up battery in 15 months Back up controller:   Replace back up battery in   months- did not bring back up equipment to clinic today  I reviewed the LVAD parameters from today and compared the results to the patient's prior recorded data.  LVAD interrogation was NEGATIVE for significant power changes, POSITIVE for clinical alarms and STABLE for PI events/speed drops. No programming changes were made and pump is functioning within specified parameters.    Exit Site Care: Drive line is being maintained weekly by VAD coordinators. Existing VAD dressing removed and site care performed using sterile technique. Drive line exit site cleaned with Chlora prep applicators x 2, allowed to dry, and Sorbaview dressing with biopatch reapplied. Anchor reapplied. Covered with large tegaderm. Drive line exit site incorporated, the velour is fully implanted at exit site. No drainage, erythema, tenderness, or foul odor noted.  Pt denies fever or chills. Continue weekly dressing changes. VAD coordinators to perform weekly dressing changes. Reinforced to mother and pt if dressing is coming off prior to dressing change appointment it MUST be changed. Patient has adequate dressing supplies at home.     Sternal wound:  Wound maintained daily by his mother. Last dressing change yesterday. Existing dressing removed. Site cleansed with betadine. Saline moistened sterile 2 x 2 placed in wound bed. Covered with sterile 4 x 4. Continue daily dressings at this time. Pt requested to be  able to shower. Will not allow showering until sternal wound healed. He verbalized understanding. Pt has adequate dressing supplies at home. Will continue Keflex TID.    Device:none  BP & Labs:  Doppler 110 - reflecting MAP  Hgb 9.9 - No S/S of bleeding. Specifically denies melena/BRBPR or nosebleeds.  LDH 345 -  established baseline of  200-300. Denies tea-colored urine. No power elevations noted on interrogation.   Patient Instructions:  1. Take Torsemide 20 mg every Monday and Friday. Take one when you get home today 2. Pick up Sildenafil on your way home! This is important for your right heart health!  3. Coumadin dosing per Leotis Shames PharmD 4. Return to clinic in 1 week for drive line dressing change 5. Return to clinic in 2 months for full VAD visit 6. STOP SMOKING AND DRINKING!  Alyce Pagan RN VAD Coordinator  Office: [phone]  24/7 Pager: [phone]

## 2019-11-27 ENCOUNTER — Encounter (HOSPITAL_COMMUNITY): Payer: Medicaid Other

## 2019-11-27 NOTE — Progress Notes (Signed)
VAD Clinic Note   Date:  11/27/2019   ID:  Jonathan Wood, DOB 04-01-1971, MRN [id number]  Location: Home  Provider location: [place] [facility] Type of Visit: Established patient  PCP: [name]  Cardiologist:  Arvilla Meres, MD Primary HF: Dr [name]   Chief Complaint: Heart Failure/LVAD  History of Present Illness: Jonathan Wood is a 49 y.o. 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  Admitted 08/21/2018 with volume overload and anemia. Diuresed with IV lasix and transitioned to torsemide 20 mg daily. GI consulted EGD completed and showed gastritis and nonbleeding duodenal ulcers, colonoscopy with 5 polyps removed. Protonix increased to BID, he was started on PO iron, and hydrocortisone suppositories for hemorrhoid treatment. He received a total of 2 uPRBCs. He will stay off ASA for several weeks but can reconsider later.  Discharge weight was 221 pounds.   Admitted 12/7-15/20 for DL infection with overlying cellulitis and volume overload. Treated with IV abx and diuresis.   Recently admitted 08/16/19-09/13/19 for DL infection with large subxiphoid abscess. Underwent several debridements and eventually a pec muscle flap. Wound cx + MSSA. Initially started on ancef and rifabutin but expanded to cefipime for a week then changed to IV ancef and po rifabutin. Now on oral keflex and rifabutin  Readmitted 11/13/19-11/16/19 with superficial abscess and tachypnea. Superficial abscess I&D'd and packed. CT scan done and showed possible deeper fluid collection. Reviewed with TCTS who felt it was muscle flap. Sent to IR for possible drainage but u/s confirmed it was the muscle flap and no drainable fluid collection. Wound cx negative. ID saw and recommended continuing Keflex.   Here for post-hospital f/u. Arrived in w/c with his mother pushing him. Says he is SOB with mild  activity, Also says weight is up 5 pounds. Not taking torsemide. Not watching his diet. Not active at all. Smoking 2-3 cigarettes per day. Not wanting CPAP. Does report 2 falls over the weekend due to tripping over his equipment and leg weakness. Denies injury or hitting his head. Denies orthopnea or PND. No fevers, chills. Denies significant drainage from wound site. No bleeding, melena or neuro symptoms. No VAD alarms.   VAD Indication: Destination Therapy   VAD interrogation & Equipment Management: Speed: 5600 Flow: 4.1 Power: 4.1w PI: 4.3  Alarms: multiple  low voltage advisories. NO EXTERNAL POWER on 11/18/19. Patient thinks he accidentally unplugged from the wall.  Events: rare  Primary Controller: Replace back up battery in 15 months Back up controller: Replace back up battery in months- did not bring back up equipment to clinic today  I reviewed the LVAD parameters from todayand compared the results to the patient's prior recorded data.LVAD interrogation wasNEGATIVE for significant power changes, POSITIVE for clinicalalarms and STABLE for PI events/speed drops. No programming changes were madeand pump is functioning within specified parameters.    Past Medical History:  Diagnosis Date   Asthma    CHF (congestive heart failure) (HCC)    a. 09/2016: EF 20-25% with cath showing normal cors   GERD (gastroesophageal reflux disease)    History of hiatal hernia    LVAD (left ventricular assist device) present (HCC)    OSA on CPAP 09/06/2018   Severe OSA with AHI 68/hr on CPAP at 12cm H2O   Past Surgical History:  Procedure Laterality Date   APPLICATION OF A-CELL OF CHEST/ABDOMEN N/A 08/24/2019  Procedure: Application Of A-Cell Of Chest/Abdomen;  Surgeon: Kerin Perna, MD;  Location: [facility] OR;  Service: Thoracic;  Laterality: N/A;   APPLICATION OF A-CELL OF CHEST/ABDOMEN N/A 09/05/2019   Procedure: Application Of A-Cell Of Chest/Abdomen;  Surgeon:  Peggye Form, DO;  Location: [facility] OR;  Service: Plastics;  Laterality: N/A;   APPLICATION OF A-CELL OF CHEST/ABDOMEN  09/03/2019   Procedure: Application Of A-Cell Of Chest/Abdomen;  Surgeon: Kerin Perna, MD;  Location: [facility] OR;  Service: Thoracic;;   APPLICATION OF WOUND VAC N/A 08/22/2019   Procedure: Debridement, Irrigation and Packing of Abdominal Incision.;  Surgeon: Kerin Perna, MD;  Location: [facility] OR;  Service: Thoracic;  Laterality: N/A;   APPLICATION OF WOUND VAC N/A 08/24/2019   Procedure: Irrigation and Debridement with WOUND VAC APPLICATION;  Surgeon: Kerin Perna, MD;  Location: [facility] OR;  Service: Thoracic;  Laterality: N/A;   APPLICATION OF WOUND VAC N/A 08/30/2019   Procedure: APPLICATION OF WOUND VAC;  Surgeon: Kerin Perna, MD;  Location: [facility] OR;  Service: Thoracic;  Laterality: N/A;   APPLICATION OF WOUND VAC N/A 09/03/2019   Procedure: WOUND VAC CHANGE;  Surgeon: Kerin Perna, MD;  Location: [facility] OR;  Service: Thoracic;  Laterality: N/A;   BIOPSY  08/23/2018   Procedure: BIOPSY;  Surgeon: Lemar Lofty., MD;  Location: [facility] ENDOSCOPY;  Service: Gastroenterology;;   COLONOSCOPY N/A 08/23/2018   Procedure: COLONOSCOPY;  Surgeon: Lemar Lofty., MD;  Location: [facility] ENDOSCOPY;  Service: Gastroenterology;  Laterality: N/A;   ENTEROSCOPY N/A 08/23/2018   Procedure: ENTEROSCOPY;  Surgeon: Meridee Score Netty Starring., MD;  Location: [facility] ENDOSCOPY;  Service: Gastroenterology;  Laterality: N/A;   HEMOSTASIS CLIP PLACEMENT  08/23/2018   Procedure: HEMOSTASIS CLIP PLACEMENT;  Surgeon: Lemar Lofty., MD;  Location: [facility] ENDOSCOPY;  Service: Gastroenterology;;   IABP INSERTION N/A 09/05/2017   Procedure: IABP INSERTION;  Surgeon: Dolores Patty, MD;  Location: [facility] INVASIVE CV LAB;  Service: Cardiovascular;  Laterality: N/A;   INSERTION OF IMPLANTABLE LEFT VENTRICULAR ASSIST DEVICE N/A 09/06/2017   Procedure: INSERTION OF IMPLANTABLE LEFT VENTRICULAR ASSIST  DEVICE - HM3;  Surgeon: Kerin Perna, MD;  Location: [facility] OR;  Service: Open Heart Surgery;  Laterality: N/A;  HM3   MUSCLE FLAP CLOSURE N/A 09/05/2019   Procedure: MUSCLE FLAP CLOSURE;  Surgeon: Peggye Form, DO;  Location: [facility] OR;  Service: Plastics;  Laterality: N/A;   NASAL FRACTURE SURGERY  1987   POLYPECTOMY  08/23/2018   Procedure: POLYPECTOMY;  Surgeon: Mansouraty, Netty Starring., MD;  Location: [facility] ENDOSCOPY;  Service: Gastroenterology;;   RIGHT HEART CATH N/A 08/30/2017   Procedure: RIGHT HEART CATH;  Surgeon: Dolores Patty, MD;  Location: [facility] INVASIVE CV LAB;  Service: Cardiovascular;  Laterality: N/A;   RIGHT/LEFT HEART CATH AND CORONARY ANGIOGRAPHY N/A 10/08/2016   Procedure: Right/Left Heart Cath and Coronary Angiography;  Surgeon: Orpah Cobb, MD;  Location: [facility] INVASIVE CV LAB;  Service: Cardiovascular;  Laterality: N/A;   STERNAL WOUND DEBRIDEMENT N/A 08/20/2019   Procedure: DEBRIDEMENT OF LVAD DRIVELINE TUNNEL;  Surgeon: Kerin Perna, MD;  Location: [facility] OR;  Service: Thoracic;  Laterality: N/A;   STERNAL WOUND DEBRIDEMENT N/A 09/05/2019   Procedure: DEBRIDEMENT AND CLOSURE OF ABDOMINAL WOUND;  Surgeon: Peggye Form, DO;  Location: [facility] OR;  Service: Plastics;  Laterality: N/A;   SUBMUCOSAL TATTOO INJECTION  08/23/2018   Procedure: SUBMUCOSAL TATTOO INJECTION;  Surgeon: Lemar Lofty., MD;  Location: [facility] ENDOSCOPY;  Service: Gastroenterology;;  TEE WITHOUT CARDIOVERSION N/A 09/06/2017   Procedure: TRANSESOPHAGEAL ECHOCARDIOGRAM (TEE);  Surgeon: Donata Clay, Theron Arista, MD;  Location: [facility] OR;  Service: Open Heart Surgery;  Laterality: N/A;   WOUND DEBRIDEMENT N/A 08/30/2019   Procedure: Debridement Abdominal Wound;  Surgeon: Kerin Perna, MD;  Location: [facility] OR;  Service: Thoracic;  Laterality: N/A;     Current Outpatient Medications  Medication Sig Dispense Refill   albuterol (PROAIR HFA) 108 (90 Base) MCG/ACT inhaler Inhale 2 puffs into the lungs every  6 (six) hours as needed for wheezing or shortness of breath. 18 g 3   cephALEXin (KEFLEX) 500 MG capsule Take 1 capsule (500 mg total) by mouth 3 (three) times daily. 90 capsule 11   docusate sodium (COLACE) 100 MG capsule Take 1 capsule (100 mg total) by mouth 3 (three) times daily. (Patient taking differently: Take 100 mg by mouth 2 (two) times daily. ) 90 capsule 2   DULoxetine (CYMBALTA) 60 MG capsule Take 1 capsule (60 mg total) by mouth daily. 30 capsule 3   gabapentin (NEURONTIN) 300 MG capsule TAKE ONE CAPSULE BY MOUTH THREE TIMES DAILY (Patient taking differently: Take 300 mg by mouth 3 (three) times daily. ) 90 capsule 6   hydrALAZINE (APRESOLINE) 50 MG tablet Take 1 tablet (50 mg total) by mouth 3 (three) times daily. 90 tablet 3   hydrOXYzine (ATARAX/VISTARIL) 25 MG tablet Take 1 tablet (25 mg total) by mouth at bedtime as needed for anxiety. 30 tablet 0   pantoprazole (PROTONIX) 40 MG tablet Take 1 tablet (40 mg total) by mouth 2 (two) times daily. 60 tablet 6   spironolactone (ALDACTONE) 25 MG tablet Take 1 tablet (25 mg total) by mouth daily. 30 tablet 6   warfarin (COUMADIN) 5 MG tablet Take 2.5 mg (0.5 tab) every Monday, Wed, and Friday and 5 mg (1 tab) all other days. (Patient taking differently: Take 1tab = 5mg   every Monday, Wed, and Friday and 2.5 mg (1/2 tab) Tuesday, Thursday, Saturday and Sunday) 180 tablet 3   zinc sulfate 220 (50 Zn) MG capsule Take 1 capsule (220 mg total) by mouth daily. 30 capsule 6   linaclotide (LINZESS) 145 MCG CAPS capsule Take 1 capsule (145 mcg total) by mouth daily before breakfast. (Patient not taking: Reported on 11/23/2019) 30 capsule 4   oxyCODONE 10 MG TABS Take 1 tablet (10 mg total) by mouth every 8 (eight) hours. (Patient not taking: Reported on 11/23/2019) 30 tablet 0   senna-docusate (SENOKOT-S) 8.6-50 MG tablet Take 1 tablet by mouth 2 (two) times daily. (Patient not taking: Reported on 11/23/2019) 60 tablet 6   sildenafil  (REVATIO) 20 MG tablet Take 1 tablet (20 mg total) by mouth 3 (three) times daily. 90 tablet 11   torsemide (DEMADEX) 20 MG tablet Take 1 tablet (20 mg total) by mouth 2 (two) times a week. Take 1 tablet (20mg ) on Monday and Friday. 45 tablet 3   traMADol (ULTRAM) 50 MG tablet Take 2 tablets (100 mg total) by mouth every 6 (six) hours as needed for moderate pain. (Patient not taking: Reported on 11/23/2019) 40 tablet 0   No current facility-administered medications for this encounter.    Allergies:   Patient has no known allergies.   Social History:  The patient  reports that he has quit smoking. His smoking use included cigarettes. He has a 12.50 pack-year smoking history. He has never used smokeless tobacco. He reports previous alcohol use. He reports previous drug use.   Family  History:  The patient's family history includes Hypertension in his father.   ROS:  Please see the history of present illness.   All other systems are personally reviewed and negative.    Vital Signs: Temp:  Doppler Pressure: 110 Automatc BP: 121/69 (104) HR: 87 SPO2: UTO % RA  Weight: 218.8  lbs w/ eqt Last weight: 213 lbs w/o equip    Exam:   General:  NAD.  HEENT: normal  Neck: supple. JVP not elevated.  Carotids 2+ bilat; no bruits. No lymphadenopathy or thryomegaly appreciated. Cor: LVAD hum. Superficial wound is packed. No significant drainage or erythema Lungs: Clear. Abdomen: obese soft, nontender, non-distended. No hepatosplenomegaly. No bruits or masses. Good bowel sounds. Driveline site clean. Anchor in place.  Extremities: no cyanosis, clubbing, rash. Warm no trace to 1+ edema  Neuro: alert & oriented x 3. No focal deficits. Moves all 4 without problem    Recent Labs: 04/23/2019: B Natriuretic Peptide 709.6 08/13/2019: TSH 0.209 09/18/2019: Magnesium 1.5 11/23/2019: ALT 176; BUN 20; Creatinine, Ser 1.21; Hemoglobin 9.9; Platelets 209; Potassium 4.4; Sodium 135    Wt Readings from Last 3  Encounters:  11/23/19 99.2 kg (218 lb 12.8 oz)  11/15/19 96.7 kg (213 lb 3 oz)  10/22/19 105.2 kg (232 lb)      ASSESSMENT AND PLAN:  1. Recurrent DL infection with subxiphoid abscess - s/p debridement and pec flap in 4/21 - remains on keflex - had recurrent superficial subxiphoid abscess. CT with posible fluid collection. Evaluated by TCTS And IR and found to be muscle flap with no draiable fluid collection 6/21  - Discussed with ID, Continue Keflex. - Site looks good today - He is not a candidate for pump exchange given severe RV dysfunction and poor self-care  2. Chronic systolic HF - EF 04% s/p HM-3 LVAD on 09/06/17 - Volume status mildly elevated in setting of RV failure and dietary non-compliance - take torsemide 20mg  M/F. Extra as needed - Stable NYHA III today - Continues t struggle with RV failure  3. HM3 LVAD 09/06/2017.  - VAD interrogated personally. Parameters stable. - LDH 218 -. 345 - INR 2.5 goal 2.0-2.5  Discussed dosing with PharmD personally. - Off ASA with PUD.  - hgb stable 9.9  4. Depression - Major factor in his situation. Pt tearful stating he is "disgusted with himself" and knows he "needs to do better." - We have brought SW in to see patient to offer mental health and paramedicine resources.   5.  Essential HTN - MAPs ok today - Had AKI with Entresto in past  6. OSA - sleep study with very severe OSA (AHI 69/hr) - continues to refuse therapy. We have arranged f/u in sleep clinic several times but he has not been compliant - we discussed again today   7. H/o GI bleed  - 08/2018 EGD completed and showed gastritis and nonbleeding duodenal ulcers, colonoscopy with 5 polyps removed. Protonix was increased to BID.  - No recent GI bleeding - hgb 9.9 - He will stay off aspirin for now.   8. Tobacco Abuse  - continues to smoke 2-3 cigs/day. - reinforced need to quit  9. Elevated bilirubin and low pre-albumin - suspected related to ETOH use and RV  failure +/- fatty liver - continue to follow  Total time spent 45 minutes. Over half that time spent discussing above.   Signed, Arvilla Meres, MD  11/27/2019 10:29 PM  [facility] [address]  [facility] [place] [address] [phone] (office) [phone] (fax)

## 2019-11-30 ENCOUNTER — Other Ambulatory Visit: Payer: Self-pay

## 2019-11-30 ENCOUNTER — Ambulatory Visit (HOSPITAL_COMMUNITY)
Admission: RE | Admit: 2019-11-30 | Discharge: 2019-11-30 | Disposition: A | Discharge status: Home / Self Care | Payer: Medicare Other | Source: Ambulatory Visit | Attending: Cardiology | Admitting: Cardiology

## 2019-11-30 ENCOUNTER — Ambulatory Visit (HOSPITAL_COMMUNITY): Payer: Self-pay | Admitting: Pharmacist

## 2019-11-30 DIAGNOSIS — Z7901 Long term (current) use of anticoagulants: Secondary | ICD-10-CM | POA: Diagnosis not present

## 2019-11-30 DIAGNOSIS — Z95811 Presence of heart assist device: Secondary | ICD-10-CM

## 2019-11-30 LAB — PROTIME-INR
INR: 2.1 — ABNORMAL HIGH (ref 0.8–1.2)
Prothrombin Time: 23 seconds — ABNORMAL HIGH (ref 11.4–15.2)

## 2019-11-30 MED ORDER — WARFARIN SODIUM 5 MG PO TABS: ORAL_TABLET | ORAL | 3 refills | Status: DC

## 2019-11-30 NOTE — Addendum Note (Signed)
Encounter addended by: Lebron Quam, RN on: 11/30/2019 2:00 PM  Actions taken: Order Reconciliation Section accessed, Clinical Note Signed

## 2019-11-30 NOTE — Progress Notes (Signed)
LVAD INR 

## 2019-11-30 NOTE — Progress Notes (Signed)
Patient presents for dressing change and labs in VAD clinic today with his mom. Denies any issues with his VAD or alarms.   Pt was able to walk into clinic today. He states that he is feeling much better today. Pt is not as SOB as he was previously.   Exit Site Care: Drive line is being maintained weekly by VAD coordinators. Existing VAD dressing removed and site care performed using sterile technique. Drive line exit site cleaned with Chlora prep applicators x 2, allowed to dry, and Sorbaview dressing with biopatch reapplied. Anchor reapplied. Covered with large tegaderm. Drive line exit site incorporated, the velour is fully implanted at exit site. No drainage, erythema, tenderness, or foul odor noted.  Pt denies fever or chills. Continue weekly dressing changes. VAD coordinators to perform weekly dressing changes. Reinforced to mother and pt if dressing is coming off prior to dressing change appointment it MUST be changed. Patient has adequate dressing supplies at home. Pt given 5 anchors for home use.    Sternal wound:  Wound maintained daily by his mother. Last dressing change yesterday. Existing dressing removed. Site cleansed w/betadine. Covered w/a bandaid. Pt/mom instructed that this area can be left open to air once the bandaid is removed.         Device:none   Patient Instructions:  1. Return to clinic next week for dressing change/INR   Carlton Adam RN VAD Coordinator  Office: [phone]  24/7 Pager: [phone]

## 2019-12-04 ENCOUNTER — Encounter (HOSPITAL_COMMUNITY): Payer: Medicaid Other

## 2019-12-05 ENCOUNTER — Other Ambulatory Visit (HOSPITAL_COMMUNITY): Payer: Self-pay | Admitting: Internal Medicine

## 2019-12-05 ENCOUNTER — Other Ambulatory Visit: Payer: Self-pay | Admitting: Internal Medicine

## 2019-12-05 NOTE — Telephone Encounter (Signed)
Would you like patient to continue medication? Patient has no up coming appointment and missed follow up appointment on 10/16/19

## 2019-12-06 ENCOUNTER — Telehealth: Payer: Self-pay

## 2019-12-06 NOTE — Telephone Encounter (Signed)
Known, rifabutin was stopped when he was in the hospital recently.

## 2019-12-06 NOTE — Telephone Encounter (Signed)
Would you like for patient to continue the Rifabutin? Patient missed last scheduled appointment on 10/16/19 and las seen 09/26/19 Jonathan Wood T Pricilla Loveless

## 2019-12-07 ENCOUNTER — Other Ambulatory Visit: Payer: Self-pay

## 2019-12-07 ENCOUNTER — Ambulatory Visit (HOSPITAL_COMMUNITY)
Admission: RE | Admit: 2019-12-07 | Discharge: 2019-12-07 | Disposition: A | Discharge status: Home / Self Care | Payer: Medicare Other | Source: Ambulatory Visit | Attending: Cardiology | Admitting: Cardiology

## 2019-12-07 ENCOUNTER — Ambulatory Visit (HOSPITAL_COMMUNITY): Payer: Self-pay | Admitting: Pharmacist

## 2019-12-07 ENCOUNTER — Other Ambulatory Visit (HOSPITAL_COMMUNITY): Payer: Self-pay | Admitting: Unknown Physician Specialty

## 2019-12-07 DIAGNOSIS — Z7901 Long term (current) use of anticoagulants: Secondary | ICD-10-CM

## 2019-12-07 DIAGNOSIS — Z4800 Encounter for change or removal of nonsurgical wound dressing: Secondary | ICD-10-CM | POA: Insufficient documentation

## 2019-12-07 DIAGNOSIS — Z95811 Presence of heart assist device: Secondary | ICD-10-CM

## 2019-12-07 LAB — PROTIME-INR
INR: 1.9 — ABNORMAL HIGH (ref 0.8–1.2)
Prothrombin Time: 21.3 seconds — ABNORMAL HIGH (ref 11.4–15.2)

## 2019-12-07 NOTE — Progress Notes (Signed)
LVAD INR 

## 2019-12-07 NOTE — Addendum Note (Signed)
Encounter addended by: Lebron Quam, RN on: 12/07/2019 1:23 PM  Actions taken: Clinical Note Signed

## 2019-12-07 NOTE — Progress Notes (Signed)
Patient presents for dressing change and labs in VAD clinic today with his mom. Denies any issues with his VAD or alarms.   Pt was able to walk into clinic today. He states that he is feeling much better today. Pt is not as SOB as he was previously.   Exit Site Care: Drive line is being maintained weekly by VAD coordinators. Existing VAD dressing removed and site care performed using sterile technique. Drive line exit site cleaned with Chlora prep applicators x 2, allowed to dry, and Sorbaview dressing with biopatch reapplied. Anchor reapplied. Covered with 2 large tegaderm. Drive line exit site incorporated, the velour is fully implanted at exit site. No drainage, erythema, tenderness, or foul odor noted.  Pt denies fever or chills. Continue weekly dressing changes. VAD coordinators to perform weekly dressing changes. Reinforced to mother and pt if dressing is coming off prior to dressing change appointment it MUST be changed. Patient has adequate dressing supplies at home. Pt given 5 anchors for home use. Pt instructed that he may shower. Pt instructed that if dressing gets wet it MUST be change.    Device:none   Patient Instructions:  1. Return to clinic next week for dressing change/INR   Carlton Adam RN VAD Coordinator  Office: [phone]  24/7 Pager: [phone]

## 2019-12-10 ENCOUNTER — Ambulatory Visit: Payer: Medicaid Other | Admitting: Nurse Practitioner

## 2019-12-12 ENCOUNTER — Telehealth (HOSPITAL_COMMUNITY): Payer: Self-pay | Admitting: Licensed Clinical Social Worker

## 2019-12-12 NOTE — Telephone Encounter (Signed)
CSW received call from Hollis CP from [facility] [phone]) stating she was able to make home visit to home of patient. She shared that when she arrived patient and his children were eating [employer] fast food. She shared the importance of healthy eating and low sodium diet and that fast food did not meet his nutrition needs. Patient later during the visit began eating the french fries from his son's bag and his young son corrected patient reiterating what the CP had shared about his diet restrictions. CP in agreement to provide nutritional support and education to both patient and children as well as work on medication compliance with patient. CSW included Carlton Adam, VAD Coordinator on conference call to further discuss VAD care and provide VAD pager number for medical needs going forward. CSW will be available as needed for CP communication and collaboration. CSW grateful to CP for involvement and support to patient. Lasandra Beech, LCSW, CCSW-MCS [phone]

## 2019-12-13 ENCOUNTER — Telehealth (HOSPITAL_COMMUNITY): Payer: Self-pay | Admitting: Licensed Clinical Social Worker

## 2019-12-13 ENCOUNTER — Ambulatory Visit (HOSPITAL_COMMUNITY)
Admission: RE | Admit: 2019-12-13 | Discharge: 2019-12-13 | Disposition: A | Discharge status: Home / Self Care | Payer: Medicare Other | Source: Ambulatory Visit | Attending: Internal Medicine | Admitting: Internal Medicine

## 2019-12-13 ENCOUNTER — Ambulatory Visit (HOSPITAL_COMMUNITY): Payer: Self-pay | Admitting: Pharmacist

## 2019-12-13 ENCOUNTER — Other Ambulatory Visit: Payer: Self-pay

## 2019-12-13 DIAGNOSIS — Z7901 Long term (current) use of anticoagulants: Secondary | ICD-10-CM | POA: Diagnosis not present

## 2019-12-13 DIAGNOSIS — Z95811 Presence of heart assist device: Secondary | ICD-10-CM | POA: Insufficient documentation

## 2019-12-13 LAB — PROTIME-INR
INR: 1.8 — ABNORMAL HIGH (ref 0.8–1.2)
Prothrombin Time: 19.9 seconds — ABNORMAL HIGH (ref 11.4–15.2)

## 2019-12-13 NOTE — Telephone Encounter (Signed)
CSW received call from [place] [facility] in [place]. who states that she made home visit along with the substance abuse counselor from their program. Patient was agreeable to begin attending a substance program in [place] [facility] to address the substance abuse issues. CSW will follow up with patient on next clinic visit. Lasandra Beech, LCSW, CCSW-MCS [phone]

## 2019-12-13 NOTE — Addendum Note (Signed)
Encounter addended by: Levonne Spiller, RN on: 12/13/2019 2:20 PM  Actions taken: Clinical Note Signed

## 2019-12-13 NOTE — Addendum Note (Signed)
Encounter addended by: Levonne Spiller, RN on: 12/13/2019 2:11 PM  Actions taken: Pend clinical note

## 2019-12-13 NOTE — Progress Notes (Signed)
LVAD INR 

## 2019-12-13 NOTE — Progress Notes (Signed)
Patient presents for dressing change and labs in VAD clinic today with his mom. Denies any issues with his VAD or alarms.   Pt was able to walk into clinic today. Pt wants to start showering. Says he has shower bag at home.  Sternal site has closed. Site cleansed and band aid placed over site.   Exit Site Care: Drive line is being maintained weekly by VAD coordinators. Existing VAD dressing removed and site care performed using sterile technique. Drive line exit site cleaned with Chlora prep applicators x 2, allowed to dry, and Sorbaview dressing with biopatch reapplied. Anchor reapplied. Covered with 2 large tegaderm. Drive line exit site incorporated, the velour is fully implanted at exit site. No drainage, erythema, tenderness, or foul odor noted.  Pt denies fever or chills. Continue weekly dressing changes. VAD coordinators to perform weekly dressing changes. Reinforced to mother and pt if dressing is coming off prior to dressing change appointment it MUST be changed. Patient has adequate dressing supplies at home. Pt instructed that he may shower. Pt instructed that if dressing gets wet it MUST be change.   Patient Instructions:  1. Return to clinic next week for dressing change/INR   Hessie Diener RN VAD Coordinator  Office: [phone]  24/7 Pager: [phone]

## 2019-12-14 ENCOUNTER — Other Ambulatory Visit (HOSPITAL_COMMUNITY): Payer: Medicaid Other

## 2019-12-14 IMAGING — US US ABDOMEN LIMITED
1 series · 14 of 25 positions shown · non-contrast
Comparison: Abdominal ultrasound August 22, 2017

CLINICAL DATA: Two days of abdominal pain.  History of CHF.

EXAM:
ULTRASOUND ABDOMEN LIMITED RIGHT UPPER QUADRANT

[Series 1: us abdomen limited · 0.25mm/px · 14 of 38 slices shown]
[im 1/38]
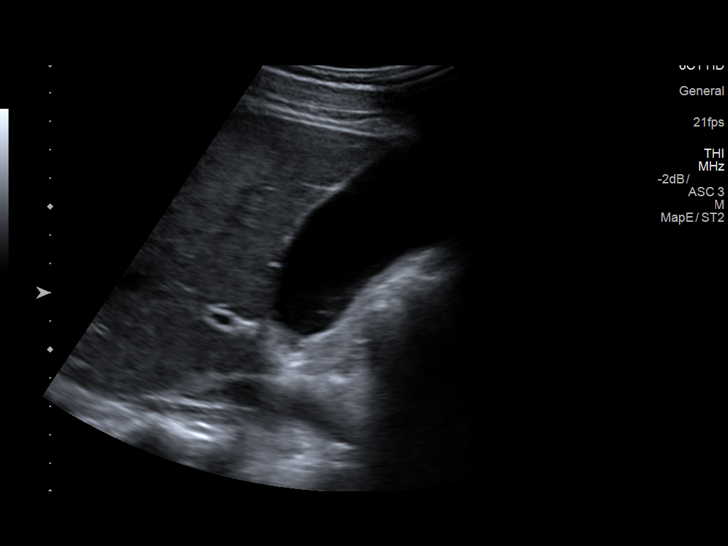
[im 4/38]
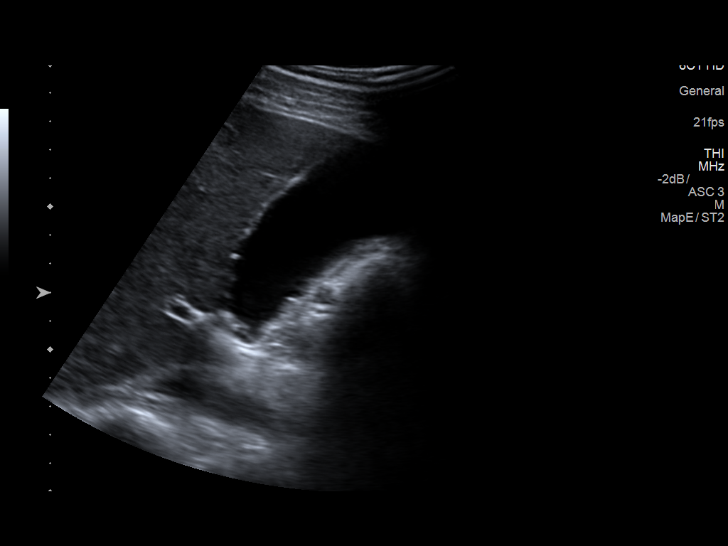
[im 7/38]
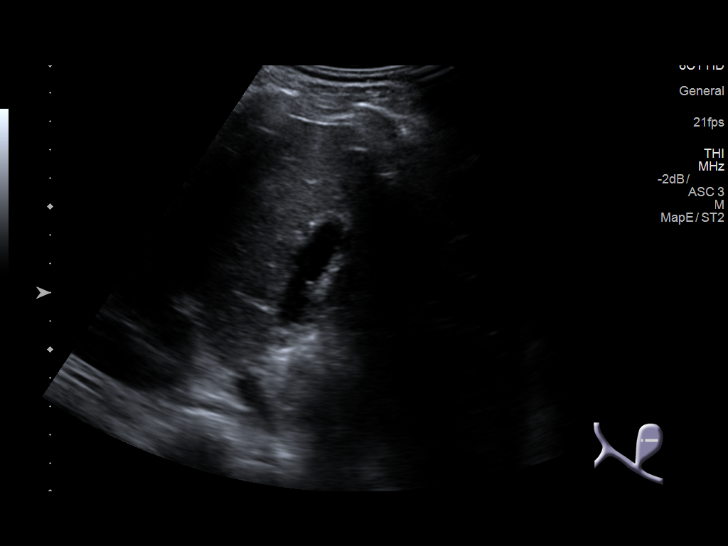
[im 10/38]
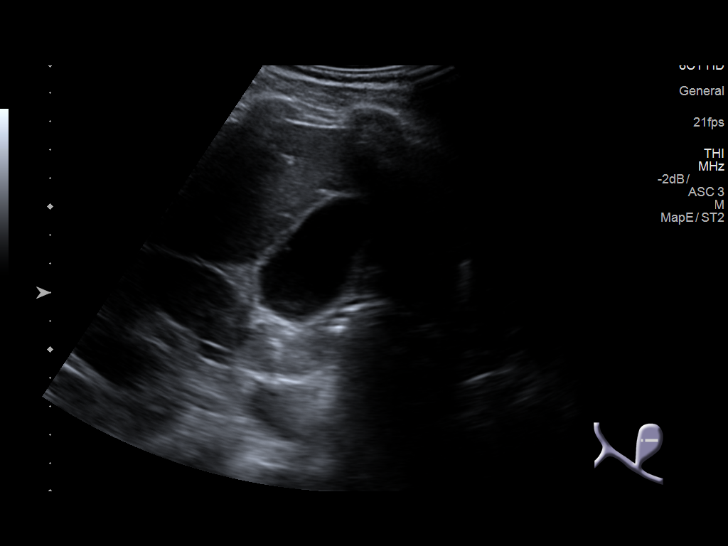
[im 13/38]
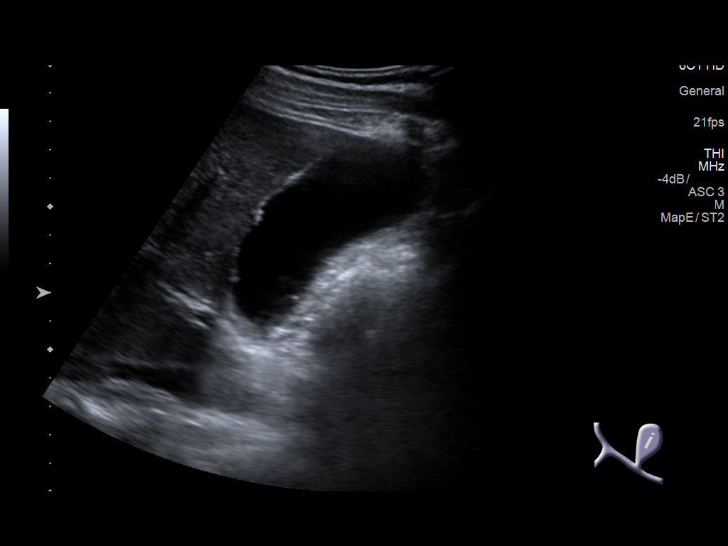
[im 14/38]
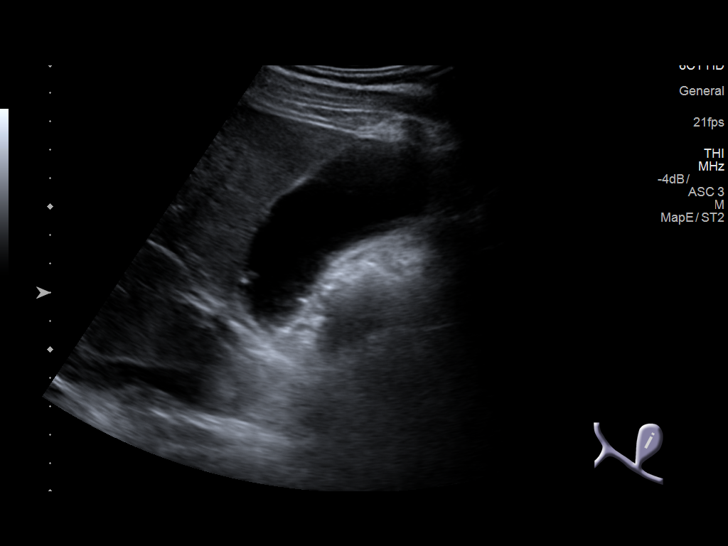
[im 17/38]
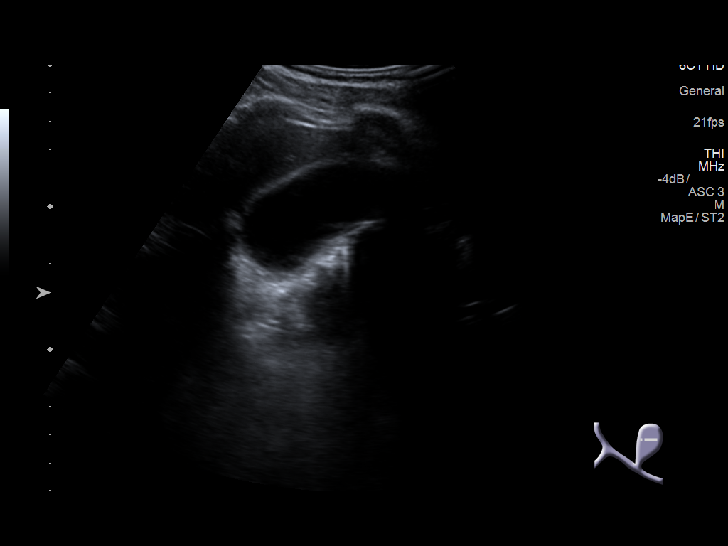
[im 21/38]
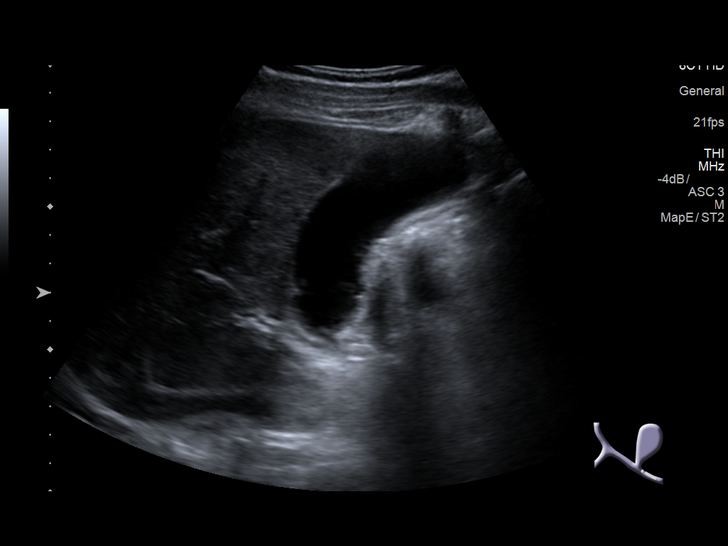
[im 24/38]
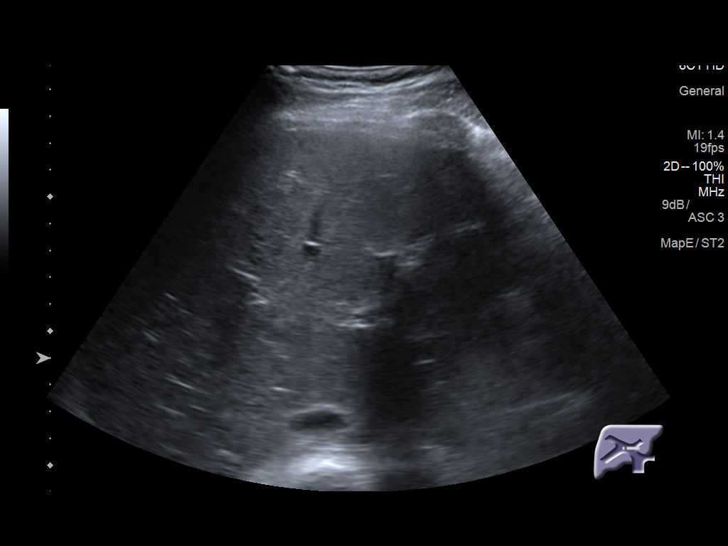
[im 25/38]
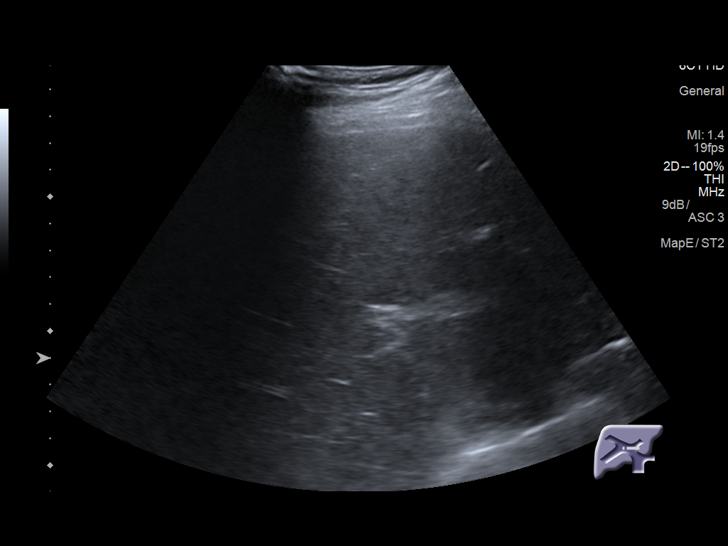
[im 28/38]
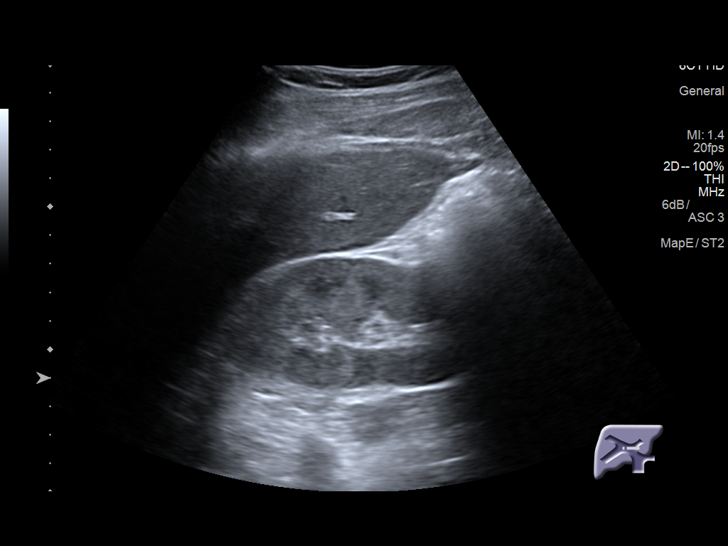
[im 31/38]
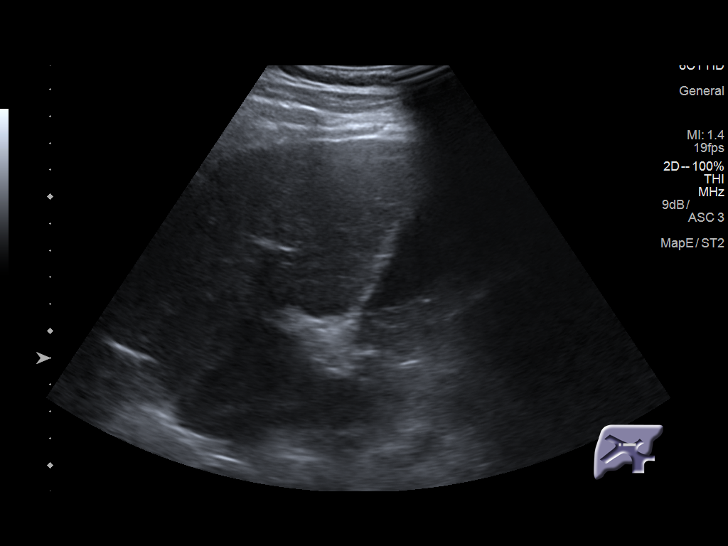
[im 34/38]
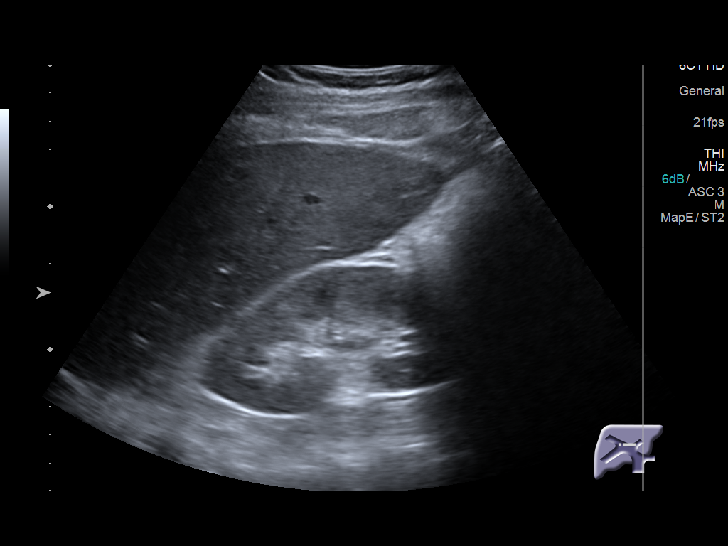
[im 38/38]
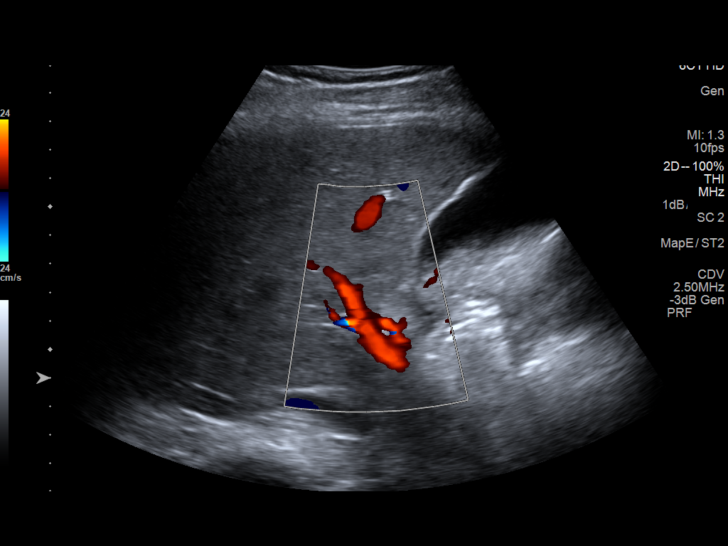

[14 of 25 positions shown; findings below may reference images not displayed]

FINDINGS: Gallbladder:

The gallbladder wall is top normal in thickness at 3.8 mm. There is
sludge present but no discrete stones are identified. A gallbladder
polyp measuring just under 3 mm in diameter is observed. There is no
positive sonographic Murphy's sign today.

Common bile duct:

Diameter: 2.4 mm

Liver:

No focal lesion identified. Within normal limits in parenchymal
echogenicity. Portal vein is patent on color Doppler imaging with
normal direction of blood flow towards the liver.

A small right pleural effusion is noted.
IMPRESSION: Gallbladder sludge, gallbladder polyp, and top-normal gallbladder
wall thickness. No stones. No sonographic evidence of acute
cholecystitis.

Small right pleural effusion.

## 2019-12-17 ENCOUNTER — Telehealth (HOSPITAL_COMMUNITY): Payer: Self-pay | Admitting: Licensed Clinical Social Worker

## 2019-12-17 NOTE — Telephone Encounter (Signed)
CSW contacted patient to follow up on referral made to [facility] program. Patient states all is going well and that he is trying to quit smoking and the CP will provide some assistance. CSW provided encouragement and patient responded "what else can I do". CSW stressed the importance of quitting and maintaining healthy lifestyle. CSW continues to be available as needed. Lasandra Beech, LCSW, CCSW-MCS [phone]

## 2019-12-18 ENCOUNTER — Other Ambulatory Visit (HOSPITAL_COMMUNITY): Payer: Self-pay | Admitting: Unknown Physician Specialty

## 2019-12-18 DIAGNOSIS — Z95811 Presence of heart assist device: Secondary | ICD-10-CM

## 2019-12-18 DIAGNOSIS — Z7901 Long term (current) use of anticoagulants: Secondary | ICD-10-CM

## 2019-12-21 ENCOUNTER — Ambulatory Visit (HOSPITAL_COMMUNITY)
Admission: RE | Admit: 2019-12-21 | Discharge: 2019-12-21 | Disposition: A | Discharge status: Home / Self Care | Payer: Medicare Other | Source: Ambulatory Visit | Attending: Cardiology | Admitting: Cardiology

## 2019-12-21 ENCOUNTER — Ambulatory Visit (HOSPITAL_COMMUNITY): Payer: Self-pay | Admitting: Pharmacist

## 2019-12-21 ENCOUNTER — Other Ambulatory Visit: Payer: Self-pay

## 2019-12-21 DIAGNOSIS — Z7901 Long term (current) use of anticoagulants: Secondary | ICD-10-CM | POA: Insufficient documentation

## 2019-12-21 DIAGNOSIS — Z95811 Presence of heart assist device: Secondary | ICD-10-CM | POA: Insufficient documentation

## 2019-12-21 LAB — PROTIME-INR
INR: 3.9 — ABNORMAL HIGH (ref 0.8–1.2)
Prothrombin Time: 37.2 seconds — ABNORMAL HIGH (ref 11.4–15.2)

## 2019-12-21 NOTE — Progress Notes (Signed)
Patient presents for dressing change and labs in VAD clinic today with his mom. Denies any issues with his VAD or alarms.   Pt in wheelchair today. States "I was a little short of breath" walking in, but otherwise feels fine.   Sternal site has closed. Site cleansed and band aid placed over site.   Exit Site Care: Drive line is being maintained weekly by VAD coordinators. NO ANCHOR IN PLACE. Discussed again the importance of wearing stabilization device to prevent drive line trauma. Existing VAD dressing removed and site care performed using sterile technique. Drive line exit site cleaned with Chlora prep applicators x 2, allowed to dry, and Sorbaview dressing with biopatch reapplied. Anchor reapplied. Covered with 2 large tegaderm. Drive line exit site incorporated, the velour is fully implanted at exit site. No drainage, erythema, tenderness, or foul odor noted.  Pt denies fever or chills. Continue weekly dressing changes. VAD coordinators to perform weekly dressing changes. Reinforced to mother and pt if dressing is coming off prior to dressing change appointment it MUST be changed. Patient provided with 4 weekly dressing kits for home use; instructed to bring to clinic each week for dressing change.   Patient Instructions:  1. Return to clinic next week for dressing change/INR   Alyce Pagan RN VAD Coordinator  Office: [phone]  24/7 Pager: [phone]

## 2019-12-21 NOTE — Progress Notes (Signed)
LVAD INR 

## 2019-12-27 ENCOUNTER — Emergency Department (HOSPITAL_COMMUNITY): Payer: Medicare Other

## 2019-12-27 ENCOUNTER — Inpatient Hospital Stay (HOSPITAL_COMMUNITY): Payer: Medicare Other

## 2019-12-27 ENCOUNTER — Inpatient Hospital Stay (HOSPITAL_COMMUNITY)
Admission: EM | Admit: 2019-12-27 | Discharge: 2020-01-06 | DRG: 291 | Disposition: A | Discharge status: Home / Self Care | Payer: Medicare Other | Attending: Internal Medicine | Admitting: Internal Medicine

## 2019-12-27 DIAGNOSIS — J45909 Unspecified asthma, uncomplicated: Secondary | ICD-10-CM | POA: Diagnosis present

## 2019-12-27 DIAGNOSIS — E162 Hypoglycemia, unspecified: Secondary | ICD-10-CM | POA: Diagnosis present

## 2019-12-27 DIAGNOSIS — R079 Chest pain, unspecified: Secondary | ICD-10-CM | POA: Diagnosis present

## 2019-12-27 DIAGNOSIS — Z79899 Other long term (current) drug therapy: Secondary | ICD-10-CM

## 2019-12-27 DIAGNOSIS — G4733 Obstructive sleep apnea (adult) (pediatric): Secondary | ICD-10-CM | POA: Diagnosis present

## 2019-12-27 DIAGNOSIS — Z95811 Presence of heart assist device: Secondary | ICD-10-CM | POA: Diagnosis not present

## 2019-12-27 DIAGNOSIS — Z452 Encounter for adjustment and management of vascular access device: Secondary | ICD-10-CM

## 2019-12-27 DIAGNOSIS — Z7901 Long term (current) use of anticoagulants: Secondary | ICD-10-CM

## 2019-12-27 DIAGNOSIS — K0889 Other specified disorders of teeth and supporting structures: Secondary | ICD-10-CM

## 2019-12-27 DIAGNOSIS — I959 Hypotension, unspecified: Secondary | ICD-10-CM | POA: Diagnosis present

## 2019-12-27 DIAGNOSIS — I130 Hypertensive heart and chronic kidney disease with heart failure and stage 1 through stage 4 chronic kidney disease, or unspecified chronic kidney disease: Secondary | ICD-10-CM | POA: Diagnosis present

## 2019-12-27 DIAGNOSIS — I472 Ventricular tachycardia: Secondary | ICD-10-CM | POA: Diagnosis not present

## 2019-12-27 DIAGNOSIS — D6489 Other specified anemias: Secondary | ICD-10-CM | POA: Diagnosis present

## 2019-12-27 DIAGNOSIS — Z8619 Personal history of other infectious and parasitic diseases: Secondary | ICD-10-CM

## 2019-12-27 DIAGNOSIS — K1379 Other lesions of oral mucosa: Secondary | ICD-10-CM | POA: Diagnosis not present

## 2019-12-27 DIAGNOSIS — Z792 Long term (current) use of antibiotics: Secondary | ICD-10-CM

## 2019-12-27 DIAGNOSIS — F411 Generalized anxiety disorder: Secondary | ICD-10-CM | POA: Diagnosis not present

## 2019-12-27 DIAGNOSIS — N179 Acute kidney failure, unspecified: Secondary | ICD-10-CM | POA: Diagnosis not present

## 2019-12-27 DIAGNOSIS — R739 Hyperglycemia, unspecified: Secondary | ICD-10-CM | POA: Diagnosis not present

## 2019-12-27 DIAGNOSIS — E872 Acidosis, unspecified: Secondary | ICD-10-CM

## 2019-12-27 DIAGNOSIS — I5043 Acute on chronic combined systolic (congestive) and diastolic (congestive) heart failure: Secondary | ICD-10-CM

## 2019-12-27 DIAGNOSIS — T827XXA Infection and inflammatory reaction due to other cardiac and vascular devices, implants and grafts, initial encounter: Secondary | ICD-10-CM | POA: Diagnosis not present

## 2019-12-27 DIAGNOSIS — I5023 Acute on chronic systolic (congestive) heart failure: Secondary | ICD-10-CM | POA: Diagnosis present

## 2019-12-27 DIAGNOSIS — K219 Gastro-esophageal reflux disease without esophagitis: Secondary | ICD-10-CM | POA: Diagnosis present

## 2019-12-27 DIAGNOSIS — Z8249 Family history of ischemic heart disease and other diseases of the circulatory system: Secondary | ICD-10-CM

## 2019-12-27 DIAGNOSIS — E876 Hypokalemia: Secondary | ICD-10-CM | POA: Diagnosis not present

## 2019-12-27 DIAGNOSIS — T827XXD Infection and inflammatory reaction due to other cardiac and vascular devices, implants and grafts, subsequent encounter: Secondary | ICD-10-CM | POA: Diagnosis not present

## 2019-12-27 DIAGNOSIS — I50810 Right heart failure, unspecified: Secondary | ICD-10-CM | POA: Diagnosis not present

## 2019-12-27 DIAGNOSIS — I5031 Acute diastolic (congestive) heart failure: Secondary | ICD-10-CM | POA: Diagnosis not present

## 2019-12-27 DIAGNOSIS — R570 Cardiogenic shock: Secondary | ICD-10-CM | POA: Diagnosis present

## 2019-12-27 DIAGNOSIS — Z20822 Contact with and (suspected) exposure to covid-19: Secondary | ICD-10-CM | POA: Diagnosis present

## 2019-12-27 DIAGNOSIS — Z9119 Patient's noncompliance with other medical treatment and regimen: Secondary | ICD-10-CM

## 2019-12-27 DIAGNOSIS — I5022 Chronic systolic (congestive) heart failure: Secondary | ICD-10-CM | POA: Diagnosis not present

## 2019-12-27 DIAGNOSIS — F1021 Alcohol dependence, in remission: Secondary | ICD-10-CM | POA: Diagnosis not present

## 2019-12-27 DIAGNOSIS — K029 Dental caries, unspecified: Secondary | ICD-10-CM | POA: Diagnosis present

## 2019-12-27 DIAGNOSIS — K121 Other forms of stomatitis: Secondary | ICD-10-CM | POA: Diagnosis not present

## 2019-12-27 DIAGNOSIS — Z87891 Personal history of nicotine dependence: Secondary | ICD-10-CM

## 2019-12-27 LAB — LACTIC ACID, PLASMA
Lactic Acid, Venous: 6.8 mmol/L (ref 0.5–1.9)
Lactic Acid, Venous: 8.5 mmol/L (ref 0.5–1.9)
Lactic Acid, Venous: >11 mmol/L (ref 0.5–1.9)
Lactic Acid, Venous: >11 mmol/L (ref 0.5–1.9)

## 2019-12-27 LAB — POCT I-STAT 7, (LYTES, BLD GAS, ICA,H+H)
Acid-base deficit: 18 mmol/L — ABNORMAL HIGH (ref 0.0–2.0)
Acid-base deficit: 13 mmol/L — ABNORMAL HIGH (ref 0.0–2.0)
Acid-base deficit: 13 mmol/L — ABNORMAL HIGH (ref 0.0–2.0)
Bicarbonate: 8.4 mmol/L — ABNORMAL LOW (ref 20.0–28.0)
Bicarbonate: 12.9 mmol/L — ABNORMAL LOW (ref 20.0–28.0)
Bicarbonate: 12 mmol/L — ABNORMAL LOW (ref 20.0–28.0)
Calcium, Ion: 1.04 mmol/L — ABNORMAL LOW (ref 1.15–1.40)
Calcium, Ion: 1.01 mmol/L — ABNORMAL LOW (ref 1.15–1.40)
Calcium, Ion: 1.01 mmol/L — ABNORMAL LOW (ref 1.15–1.40)
HCT: 34 % — ABNORMAL LOW (ref 39.0–52.0)
HCT: 33 % — ABNORMAL LOW (ref 39.0–52.0)
HCT: 32 % — ABNORMAL LOW (ref 39.0–52.0)
Hemoglobin: 11.6 g/dL — ABNORMAL LOW (ref 13.0–17.0)
Hemoglobin: 11.2 g/dL — ABNORMAL LOW (ref 13.0–17.0)
Hemoglobin: 10.9 g/dL — ABNORMAL LOW (ref 13.0–17.0)
O2 Saturation: 97 %
O2 Saturation: 39 %
O2 Saturation: 99 %
Patient temperature: 36.3
Patient temperature: 97.3
Patient temperature: 97.3
Potassium: 4.1 mmol/L (ref 3.5–5.1)
Potassium: 4.3 mmol/L (ref 3.5–5.1)
Potassium: 4.2 mmol/L (ref 3.5–5.1)
Sodium: 135 mmol/L (ref 135–145)
Sodium: 132 mmol/L — ABNORMAL LOW (ref 135–145)
Sodium: 131 mmol/L — ABNORMAL LOW (ref 135–145)
TCO2: 9 mmol/L — ABNORMAL LOW (ref 22–32)
TCO2: 14 mmol/L — ABNORMAL LOW (ref 22–32)
TCO2: 13 mmol/L — ABNORMAL LOW (ref 22–32)
pCO2 arterial: 21.9 mmHg — ABNORMAL LOW (ref 32.0–48.0)
pCO2 arterial: 28.7 mmHg — ABNORMAL LOW (ref 32.0–48.0)
pCO2 arterial: 25.4 mmHg — ABNORMAL LOW (ref 32.0–48.0)
pH, Arterial: 7.19 — CL (ref 7.350–7.450)
pH, Arterial: 7.256 — ABNORMAL LOW (ref 7.350–7.450)
pH, Arterial: 7.278 — ABNORMAL LOW (ref 7.350–7.450)
pO2, Arterial: 105 mmHg (ref 83.0–108.0)
pO2, Arterial: 25 mmHg — CL (ref 83.0–108.0)
pO2, Arterial: 144 mmHg — ABNORMAL HIGH (ref 83.0–108.0)

## 2019-12-27 LAB — I-STAT CHEM 8, ED
BUN: 12 mg/dL (ref 6–20)
Calcium, Ion: 0.96 mmol/L — ABNORMAL LOW (ref 1.15–1.40)
Chloride: 106 mmol/L (ref 98–111)
Creatinine, Ser: 1.4 mg/dL — ABNORMAL HIGH (ref 0.61–1.24)
Glucose, Bld: 46 mg/dL — ABNORMAL LOW (ref 70–99)
HCT: 37 % — ABNORMAL LOW (ref 39.0–52.0)
Hemoglobin: 12.6 g/dL — ABNORMAL LOW (ref 13.0–17.0)
Potassium: 3.8 mmol/L (ref 3.5–5.1)
Sodium: 136 mmol/L (ref 135–145)
TCO2: 14 mmol/L — ABNORMAL LOW (ref 22–32)

## 2019-12-27 LAB — BASIC METABOLIC PANEL
Anion gap: 19 — ABNORMAL HIGH (ref 5–15)
BUN: 13 mg/dL (ref 6–20)
CO2: 14 mmol/L — ABNORMAL LOW (ref 22–32)
Calcium: 9.4 mg/dL (ref 8.9–10.3)
Chloride: 102 mmol/L (ref 98–111)
Creatinine, Ser: 1.68 mg/dL — ABNORMAL HIGH (ref 0.61–1.24)
GFR calc Af Amer: 56 mL/min — ABNORMAL LOW (ref >60)
GFR calc non Af Amer: 48 mL/min — ABNORMAL LOW (ref >60)
Glucose, Bld: 48 mg/dL — ABNORMAL LOW (ref 70–99)
Potassium: 3.8 mmol/L (ref 3.5–5.1)
Sodium: 135 mmol/L (ref 135–145)

## 2019-12-27 LAB — URINALYSIS, ROUTINE W REFLEX MICROSCOPIC
Bilirubin Urine: NEGATIVE
Glucose, UA: NEGATIVE mg/dL
Ketones, ur: NEGATIVE mg/dL
Leukocytes,Ua: NEGATIVE
Nitrite: NEGATIVE
Protein, ur: 100 mg/dL — AB
Specific Gravity, Urine: >1.046 — ABNORMAL HIGH (ref 1.005–1.030)
pH: 5 (ref 5.0–8.0)

## 2019-12-27 LAB — RAPID URINE DRUG SCREEN, HOSP PERFORMED
Amphetamines: NOT DETECTED
Barbiturates: NOT DETECTED
Benzodiazepines: NOT DETECTED
Cocaine: NOT DETECTED
Opiates: POSITIVE — AB
Tetrahydrocannabinol: NOT DETECTED

## 2019-12-27 LAB — CBG MONITORING, ED
Glucose-Capillary: 137 mg/dL — ABNORMAL HIGH (ref 70–99)
Glucose-Capillary: 14 mg/dL — CL (ref 70–99)
Glucose-Capillary: 25 mg/dL — CL (ref 70–99)

## 2019-12-27 LAB — PROTIME-INR
INR: 2.4 — ABNORMAL HIGH (ref 0.8–1.2)
INR: 3 — ABNORMAL HIGH (ref 0.8–1.2)
Prothrombin Time: 25.6 seconds — ABNORMAL HIGH (ref 11.4–15.2)
Prothrombin Time: 30 seconds — ABNORMAL HIGH (ref 11.4–15.2)

## 2019-12-27 LAB — HEPATIC FUNCTION PANEL
ALT: 16 U/L (ref 0–44)
AST: 34 U/L (ref 15–41)
Albumin: 3.1 g/dL — ABNORMAL LOW (ref 3.5–5.0)
Alkaline Phosphatase: 115 U/L (ref 38–126)
Bilirubin, Direct: 2.1 mg/dL — ABNORMAL HIGH (ref 0.0–0.2)
Indirect Bilirubin: 2.1 mg/dL — ABNORMAL HIGH (ref 0.3–0.9)
Total Bilirubin: 4.2 mg/dL — ABNORMAL HIGH (ref 0.3–1.2)
Total Protein: 7.9 g/dL (ref 6.5–8.1)

## 2019-12-27 LAB — CBC
HCT: 34.7 % — ABNORMAL LOW (ref 39.0–52.0)
Hemoglobin: 9.9 g/dL — ABNORMAL LOW (ref 13.0–17.0)
MCH: 24.8 pg — ABNORMAL LOW (ref 26.0–34.0)
MCHC: 28.5 g/dL — ABNORMAL LOW (ref 30.0–36.0)
MCV: 86.8 fL (ref 80.0–100.0)
Platelets: 215 10*3/uL (ref 150–400)
RBC: 4 MIL/uL — ABNORMAL LOW (ref 4.22–5.81)
RDW: 20.5 % — ABNORMAL HIGH (ref 11.5–15.5)
WBC: 11.3 10*3/uL — ABNORMAL HIGH (ref 4.0–10.5)
nRBC: 0 % (ref 0.0–0.2)

## 2019-12-27 LAB — LACTATE DEHYDROGENASE
LDH: 282 U/L — ABNORMAL HIGH (ref 98–192)
LDH: 286 U/L — ABNORMAL HIGH (ref 98–192)

## 2019-12-27 LAB — I-STAT VENOUS BLOOD GAS, ED
Acid-base deficit: 22 mmol/L — ABNORMAL HIGH (ref 0.0–2.0)
Bicarbonate: 7 mmol/L — ABNORMAL LOW (ref 20.0–28.0)
Calcium, Ion: 0.96 mmol/L — ABNORMAL LOW (ref 1.15–1.40)
HCT: 36 % — ABNORMAL LOW (ref 39.0–52.0)
Hemoglobin: 12.2 g/dL — ABNORMAL LOW (ref 13.0–17.0)
O2 Saturation: 77 %
Potassium: 4.2 mmol/L (ref 3.5–5.1)
Sodium: 133 mmol/L — ABNORMAL LOW (ref 135–145)
TCO2: 8 mmol/L — ABNORMAL LOW (ref 22–32)
pCO2, Ven: 24.6 mmHg — ABNORMAL LOW (ref 44.0–60.0)
pH, Ven: 7.06 — CL (ref 7.250–7.430)
pO2, Ven: 58 mmHg — ABNORMAL HIGH (ref 32.0–45.0)

## 2019-12-27 LAB — SALICYLATE LEVEL: Salicylate Lvl: <7 mg/dL — ABNORMAL LOW (ref 7.0–30.0)

## 2019-12-27 LAB — CORTISOL: Cortisol, Plasma: 52.5 ug/dL

## 2019-12-27 LAB — MRSA PCR SCREENING: MRSA by PCR: NEGATIVE

## 2019-12-27 LAB — GLUCOSE, CAPILLARY
Glucose-Capillary: 125 mg/dL — ABNORMAL HIGH (ref 70–99)
Glucose-Capillary: 235 mg/dL — ABNORMAL HIGH (ref 70–99)

## 2019-12-27 LAB — BETA-HYDROXYBUTYRIC ACID: Beta-Hydroxybutyric Acid: 0.31 mmol/L — ABNORMAL HIGH (ref 0.05–0.27)

## 2019-12-27 LAB — TROPONIN I (HIGH SENSITIVITY)
Troponin I (High Sensitivity): 39 ng/L — ABNORMAL HIGH (ref <18)
Troponin I (High Sensitivity): 37 ng/L — ABNORMAL HIGH (ref <18)

## 2019-12-27 LAB — COOXEMETRY PANEL
Carboxyhemoglobin: 1.7 % — ABNORMAL HIGH (ref 0.5–1.5)
Methemoglobin: 1.3 % (ref 0.0–1.5)
O2 Saturation: 51.1 %
Total hemoglobin: 8.5 g/dL — ABNORMAL LOW (ref 12.0–16.0)

## 2019-12-27 LAB — SARS CORONAVIRUS 2 BY RT PCR (HOSPITAL ORDER, PERFORMED IN ~~LOC~~ HOSPITAL LAB): SARS Coronavirus 2: NEGATIVE

## 2019-12-27 MED ORDER — DOCUSATE SODIUM 100 MG PO CAPS
100.0000 mg | ORAL_CAPSULE | Freq: Two times a day (BID) | ORAL | Status: DC
  Administered 2019-12-31 – 2020-01-05 (×2): 100 mg via ORAL
  Filled 2019-12-27 (×13): qty 1

## 2019-12-27 MED ORDER — ZINC SULFATE 220 (50 ZN) MG PO CAPS
220.0000 mg | ORAL_CAPSULE | Freq: Every day | ORAL | Status: DC
  Administered 2019-12-28 – 2020-01-06 (×10): 220 mg via ORAL
  Filled 2019-12-27 (×10): qty 1

## 2019-12-27 MED ORDER — SODIUM CHLORIDE 0.9 % IV BOLUS
500.0000 mL | Freq: Once | INTRAVENOUS | Status: AC
  Administered 2019-12-27: 500 mL via INTRAVENOUS

## 2019-12-27 MED ORDER — ALBUTEROL SULFATE (2.5 MG/3ML) 0.083% IN NEBU: 2.5000 mg | INHALATION_SOLUTION | Freq: Four times a day (QID) | RESPIRATORY_TRACT | Status: DC | PRN

## 2019-12-27 MED ORDER — VANCOMYCIN HCL 1250 MG/250ML IV SOLN: 1250.0000 mg | Freq: Two times a day (BID) | INTRAVENOUS | Status: DC

## 2019-12-27 MED ORDER — DULOXETINE HCL 60 MG PO CPEP
60.0000 mg | ORAL_CAPSULE | Freq: Every day | ORAL | Status: DC
  Administered 2019-12-27 – 2020-01-06 (×11): 60 mg via ORAL
  Filled 2019-12-27 (×11): qty 1

## 2019-12-27 MED ORDER — SODIUM BICARBONATE 8.4 % IV SOLN: INTRAVENOUS | Status: DC

## 2019-12-27 MED ORDER — CEFAZOLIN SODIUM-DEXTROSE 2-4 GM/100ML-% IV SOLN: 2.0000 g | Freq: Three times a day (TID) | INTRAVENOUS | Status: DC

## 2019-12-27 MED ORDER — CHLORHEXIDINE GLUCONATE CLOTH 2 % EX PADS
6.0000 | MEDICATED_PAD | Freq: Every day | CUTANEOUS | Status: DC
  Administered 2019-12-28 – 2020-01-04 (×9): 6 via TOPICAL

## 2019-12-27 MED ORDER — SODIUM BICARBONATE-DEXTROSE 150-5 MEQ/L-% IV SOLN
150.0000 meq | INTRAVENOUS | Status: DC
  Administered 2019-12-27 – 2019-12-28 (×2): 150 meq via INTRAVENOUS
  Filled 2019-12-27 (×2): qty 1000

## 2019-12-27 MED ORDER — HYDRALAZINE HCL 25 MG PO TABS: 50.0000 mg | ORAL_TABLET | Freq: Three times a day (TID) | ORAL | Status: DC

## 2019-12-27 MED ORDER — HYDROXYZINE HCL 25 MG PO TABS
25.0000 mg | ORAL_TABLET | Freq: Every evening | ORAL | Status: DC | PRN
  Administered 2019-12-28 – 2020-01-05 (×8): 25 mg via ORAL
  Filled 2019-12-27 (×8): qty 1

## 2019-12-27 MED ORDER — SODIUM BICARBONATE 8.4 % IV SOLN
100.0000 meq | Freq: Once | INTRAVENOUS | Status: AC
  Administered 2019-12-27: 100 meq via INTRAVENOUS

## 2019-12-27 MED ORDER — VANCOMYCIN HCL 1250 MG/250ML IV SOLN
1250.0000 mg | Freq: Two times a day (BID) | INTRAVENOUS | Status: DC
  Administered 2019-12-28: 1250 mg via INTRAVENOUS
  Filled 2019-12-27 (×2): qty 250

## 2019-12-27 MED ORDER — MILRINONE LACTATE IN DEXTROSE 20-5 MG/100ML-% IV SOLN
0.1250 ug/kg/min | INTRAVENOUS | Status: DC
  Administered 2019-12-27 – 2020-01-03 (×11): 0.25 ug/kg/min via INTRAVENOUS
  Administered 2020-01-04: 0.125 ug/kg/min via INTRAVENOUS
  Filled 2019-12-27 (×14): qty 100

## 2019-12-27 MED ORDER — ONDANSETRON HCL 4 MG/2ML IJ SOLN
4.0000 mg | Freq: Once | INTRAMUSCULAR | Status: AC
  Administered 2019-12-27: 4 mg via INTRAVENOUS
  Filled 2019-12-27: qty 2

## 2019-12-27 MED ORDER — ACETAMINOPHEN 325 MG PO TABS
650.0000 mg | ORAL_TABLET | ORAL | Status: DC | PRN
  Administered 2019-12-27 – 2020-01-05 (×11): 650 mg via ORAL
  Filled 2019-12-27 (×11): qty 2

## 2019-12-27 MED ORDER — TORSEMIDE 20 MG PO TABS: 20.0000 mg | ORAL_TABLET | ORAL | Status: DC

## 2019-12-27 MED ORDER — SODIUM CHLORIDE 0.9 % IV SOLN
2.0000 g | Freq: Three times a day (TID) | INTRAVENOUS | Status: DC
  Administered 2019-12-27 – 2019-12-28 (×2): 2 g via INTRAVENOUS
  Filled 2019-12-27 (×4): qty 2

## 2019-12-27 MED ORDER — LORAZEPAM 2 MG/ML IJ SOLN
0.5000 mg | Freq: Once | INTRAMUSCULAR | Status: AC
  Administered 2019-12-27: 0.5 mg via INTRAVENOUS
  Filled 2019-12-27: qty 1

## 2019-12-27 MED ORDER — DEXTROSE-NACL 5-0.45 % IV SOLN: Freq: Once | INTRAVENOUS | Status: AC

## 2019-12-27 MED ORDER — GABAPENTIN 300 MG PO CAPS
300.0000 mg | ORAL_CAPSULE | Freq: Three times a day (TID) | ORAL | Status: DC
  Administered 2019-12-27 – 2020-01-06 (×31): 300 mg via ORAL
  Filled 2019-12-27 (×31): qty 1

## 2019-12-27 MED ORDER — SODIUM CHLORIDE 0.9 % IV SOLN
2.0000 g | Freq: Once | INTRAVENOUS | Status: AC
  Administered 2019-12-27: 2 g via INTRAVENOUS
  Filled 2019-12-27: qty 2

## 2019-12-27 MED ORDER — MORPHINE SULFATE (PF) 4 MG/ML IV SOLN
4.0000 mg | Freq: Once | INTRAVENOUS | Status: AC
  Administered 2019-12-27: 4 mg via INTRAVENOUS
  Filled 2019-12-27: qty 1

## 2019-12-27 MED ORDER — SODIUM BICARBONATE 8.4 % IV SOLN
INTRAVENOUS | Status: DC
  Filled 2019-12-27 (×5): qty 150

## 2019-12-27 MED ORDER — WARFARIN SODIUM 2.5 MG PO TABS
2.5000 mg | ORAL_TABLET | Freq: Once | ORAL | Status: DC
  Filled 2019-12-27: qty 1

## 2019-12-27 MED ORDER — ALBUTEROL SULFATE HFA 108 (90 BASE) MCG/ACT IN AERS
2.0000 | INHALATION_SPRAY | Freq: Four times a day (QID) | RESPIRATORY_TRACT | Status: DC | PRN
  Filled 2019-12-27: qty 6.7

## 2019-12-27 MED ORDER — PANTOPRAZOLE SODIUM 40 MG PO TBEC
40.0000 mg | DELAYED_RELEASE_TABLET | Freq: Two times a day (BID) | ORAL | Status: DC
  Administered 2019-12-27 – 2020-01-06 (×20): 40 mg via ORAL
  Filled 2019-12-27 (×20): qty 1

## 2019-12-27 MED ORDER — MORPHINE SULFATE (PF) 4 MG/ML IV SOLN: 4.0000 mg | Freq: Once | INTRAVENOUS | Status: DC

## 2019-12-27 MED ORDER — VANCOMYCIN HCL IN DEXTROSE 1-5 GM/200ML-% IV SOLN
1000.0000 mg | Freq: Once | INTRAVENOUS | Status: AC
  Administered 2019-12-27: 1000 mg via INTRAVENOUS
  Filled 2019-12-27: qty 200

## 2019-12-27 MED ORDER — IOHEXOL 350 MG/ML SOLN
100.0000 mL | Freq: Once | INTRAVENOUS | Status: AC | PRN
  Administered 2019-12-27: 100 mL via INTRAVENOUS

## 2019-12-27 MED ORDER — SODIUM BICARBONATE 8.4 % IV SOLN
INTRAVENOUS | Status: AC
  Administered 2019-12-27: 100 meq
  Filled 2019-12-27: qty 100

## 2019-12-27 MED ORDER — SILDENAFIL CITRATE 20 MG PO TABS: 20.0000 mg | ORAL_TABLET | Freq: Three times a day (TID) | ORAL | Status: DC

## 2019-12-27 MED ORDER — ONDANSETRON HCL 4 MG/2ML IJ SOLN: 4.0000 mg | Freq: Once | INTRAMUSCULAR | Status: DC

## 2019-12-27 MED ORDER — WARFARIN - PHARMACIST DOSING INPATIENT: Freq: Every day | Status: DC

## 2019-12-27 MED ORDER — SPIRONOLACTONE 25 MG PO TABS
25.0000 mg | ORAL_TABLET | Freq: Every day | ORAL | Status: DC
  Filled 2019-12-27: qty 1

## 2019-12-27 MED ORDER — SODIUM CHLORIDE 0.9% FLUSH: 10.0000 mL | INTRAVENOUS | Status: DC | PRN

## 2019-12-27 MED ORDER — SODIUM CHLORIDE 0.9% FLUSH
10.0000 mL | Freq: Two times a day (BID) | INTRAVENOUS | Status: DC
  Administered 2019-12-28 – 2020-01-05 (×16): 10 mL

## 2019-12-27 MED ORDER — VANCOMYCIN HCL 2000 MG/400ML IV SOLN
2000.0000 mg | Freq: Once | INTRAVENOUS | Status: DC
  Administered 2019-12-27 (×2): 2000 mg via INTRAVENOUS
  Filled 2019-12-27: qty 400

## 2019-12-27 MED ORDER — SODIUM BICARBONATE 8.4 % IV SOLN: 100.0000 meq | Freq: Once | INTRAVENOUS | Status: AC

## 2019-12-27 MED ORDER — DEXTROSE 50 % IV SOLN
INTRAVENOUS | Status: AC
  Administered 2019-12-27: 50 mL
  Filled 2019-12-27: qty 50

## 2019-12-27 MED ORDER — SODIUM CHLORIDE 0.9 % IV SOLN: 2.0000 g | Freq: Three times a day (TID) | INTRAVENOUS | Status: DC

## 2019-12-27 NOTE — Consult Note (Signed)
[facility]    Date of Admission:  12/27/2019     Total days of antibiotics 1  Vancomycin + Cefepime                Reason for Consult: H/O MSSA Driveline Infection     Referring Provider: Bensimhon / VAD team  Primary Care Provider: Claiborne Rigg, NP   Assessment: Jonathan Wood is a 49 y.o. 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 He has continued on suppressive cephalexin TID, however over the last few days has developed significant left chest pain that radiates to back. CT scan r/o dissection. Chest CT with stable appearing elongated fluid collection overlying median sternotomy, smaller rounded fluid collections overlying the left pectoralis major (ppm implant previously) and small elongated fluid collection just inferior to driveline in superficial soft tissues of hemiabdomen.  Anterior chest has healed over with scab remaining at the lower portion of incision.   Of course we worry about pocket infection with this patient given chest pain description. Staph aureus infections in the presence of retained hardware are very challenging and sometimes not curable without removal of device despite suppressive effort. Unclear to me if one of these smaller fluid collections are new compared to previous scan. Overall his anterior chest, while disfigured due to flap, does not appear inflamed or fluctuant. Antibiotics per primary team expanded until we get more information. Would continue to cover known MSSA and consider narrowing with more micro information   Pending TCTS evaluation. Not a device candidate due to right sided failure. Not a transplant candidate due to recent tobacco use. His driveline exit site is in good shape and not tender.   Unclear as to lactic  acidosis - sepsis vs medication side effect vs alternative.    Plan: 1. Would request to continue covering MSSA in antibiotic regimen 2. Follow micro for more information  3. Follow for surgery recommendations      Active Problems:   Infection associated with driveline of left ventricular assist device (LVAD) (HCC)   . docusate sodium  100 mg Oral BID  . DULoxetine  60 mg Oral Daily  . gabapentin  300 mg Oral TID  . pantoprazole  40 mg Oral BID  . zinc sulfate  220 mg Oral Daily    HPI: Jonathan Wood is a 49 y.o. male admitted to evaluate for lactic acidosis and severe left anterior chest pain that radiated to back.   His mother joins him at the bedside - she has been helping care for him and his medical need.   Admitted with driveline infection several times starting 04/2019 - treated at this time with IV antibiotics and no debridement.  In April 2021 he presented with recurrent driveline infection and large subxiphoid abscess. He underwent serial debridements at that time and pectoral muscle flap with 6 weeks of IV cefazolin + rifabutin >> cephalexin TID.  Admitted 6/29 - 11/16/2019 to drain superficial abscess to anterior chest - CT scan at that time with concern over possible deeper fluid collection - felt to be r/t muscle flap; IR evaluated for possible perc drainage but confirmed no muscle flap and no drainable fluid. He continued with ongoing Cephalexin. Serial wound swabs revealed no growth on cultures.   All  wound cultures in the record indicate MSSA. He has had several rounds of IV cefazolin +/- rifabutin paired with debridements.   Jonathan Wood says that he over the last 2 days or so had a pretty sudden onset of pain to the left anterior chest radiating to his back. He has continued his antibiotics PO TID without many missed doses if any. Driveline site to the abdomen looks good and has no pain. He has not had any fevers or chills to his knowledge and no sweating episodes.  Eating and drinking but not much. Weights have been stable.    Review of Systems: Review of Systems  Constitutional: Negative for chills, diaphoresis, fever and weight loss.  Respiratory: Negative for cough and shortness of breath.   Cardiovascular: Positive for chest pain. Negative for leg swelling.  Gastrointestinal: Negative for abdominal pain, diarrhea, nausea and vomiting.  Genitourinary: Negative for dysuria.  Musculoskeletal: Positive for back pain (posterior chest to the left).  Neurological: Negative for dizziness and weakness.    Past Medical History:  Diagnosis Date  . Asthma   . CHF (congestive heart failure) (HCC)    a. 09/2016: EF 20-25% with cath showing normal cors  . GERD (gastroesophageal reflux disease)   . History of hiatal hernia   . LVAD (left ventricular assist device) present (HCC)   . OSA on CPAP 09/06/2018   Severe OSA with AHI 68/hr on CPAP at 12cm H2O    Social History   Tobacco Use  . Smoking status: Former Smoker    Packs/day: 0.50    Years: 25.00    Pack years: 12.50    Types: Cigarettes  . Smokeless tobacco: Never Used  . Tobacco comment: quit 08-16-2019  Vaping Use  . Vaping Use: Never used  Substance Use Topics  . Alcohol use: Not Currently    Comment: occas  . Drug use: Not Currently    Family History  Problem Relation Age of Onset  . Hypertension Father   . Colon cancer Neg Hx   . Esophageal cancer Neg Hx   . Inflammatory bowel disease Neg Hx   . Liver disease Neg Hx   . Pancreatic cancer Neg Hx   . Rectal cancer Neg Hx   . Stomach cancer Neg Hx   . Diabetes Neg Hx    No Known Allergies  OBJECTIVE: Blood pressure 101/83, pulse (!) 117, temperature 97.6 F (36.4 C), resp. rate (!) 32, height 6\' 2"  (1.88 m), weight 98.9 kg, SpO2 98 %.  Physical Exam Constitutional:      Comments: Sitting upright in stretcher. Chronically ill appearing. Increased work of breathing.   Cardiovascular:     Rate and Rhythm: Regular rhythm.  Tachycardia present.     Heart sounds: Normal heart sounds.  Pulmonary:     Effort: Tachypnea present.     Breath sounds: No decreased breath sounds, wheezing or rhonchi.  Chest:     Chest wall: Deformity and tenderness present.       Comments: Anterior sternotomy healed, one small area of scab just superior to xiphoid. Pain to left anterior chest marked - no erythematous appearing changes to skin. He does have asymmetry to chest wall about the sternum likely r/t muscle flap. No fluctuance. No drainage.  Skin:    General: Skin is warm and dry.     Capillary Refill: Capillary refill takes less than 2 seconds.  Neurological:     Mental Status: He is alert and oriented to person, place, and time.  Lab Results Lab Results  Component Value Date   WBC 11.3 (H) 12/27/2019   HGB 9.9 (L) 12/27/2019   HCT 34.7 (L) 12/27/2019   MCV 86.8 12/27/2019   PLT 215 12/27/2019    Lab Results  Component Value Date   CREATININE 1.68 (H) 12/27/2019   BUN 13 12/27/2019   NA 135 12/27/2019   K 3.8 12/27/2019   CL 102 12/27/2019   CO2 14 (L) 12/27/2019    Lab Results  Component Value Date   ALT 176 (H) 11/23/2019   AST 115 (H) 11/23/2019   ALKPHOS 123 11/23/2019   BILITOT 3.8 (H) 11/23/2019     Microbiology: Recent Results (from the past 240 hour(s))  SARS Coronavirus 2 by RT PCR (hospital order, performed in [facility] hospital lab) Nasopharyngeal Nasopharyngeal Swab     Status: None   Collection Time: 12/27/19 12:52 PM   Specimen: Nasopharyngeal Swab  Result Value Ref Range Status   SARS Coronavirus 2 NEGATIVE NEGATIVE Final    Comment: (NOTE) SARS-CoV-2 target nucleic acids are NOT DETECTED.  The SARS-CoV-2 RNA is generally detectable in upper and lower respiratory specimens during the acute phase of infection. The lowest concentration of SARS-CoV-2 viral copies this assay can detect is 250 copies / mL. A negative result does not preclude SARS-CoV-2 infection and should not be  used as the sole basis for treatment or other patient management decisions.  A negative result may occur with improper specimen collection / handling, submission of specimen other than nasopharyngeal swab, presence of viral mutation(s) within the areas targeted by this assay, and inadequate number of viral copies (<250 copies / mL). A negative result must be combined with clinical observations, patient history, and epidemiological information.  Fact Sheet for Patients:   BoilerBrush.com.cy  Fact Sheet for Healthcare Providers: https://pope.com/  This test is not yet approved or  cleared by the [place] FDA and has been authorized for detection and/or diagnosis of SARS-CoV-2 by FDA under an Emergency Use Authorization (EUA).  This EUA will remain in effect (meaning this test can be used) for the duration of the COVID-19 declaration under Section 564(b)(1) of the Act, 21 U.S.C. section 360bbb-3(b)(1), unless the authorization is terminated or revoked sooner.  Performed at [facility], [address], [place], [place] [address]      Rexene Alberts, MSN, NP-C [facility]  [email or web address] Pager: [phone] Office: [phone] [facility] Main Line: [phone]

## 2019-12-27 NOTE — Progress Notes (Signed)
°  Patient developed profound hypoglycemia in ER given OJ and amp D50.   Bicarb drip started.  VBG drawn and ph 7.06 with bicrb 7.0  Given 2 amps bicarb.   Adamantly denies toxin/ethanol/ASA ingestion.   Has not made urine yet.   Art line placed and MAPs ~ 100. No evidence of hypoperfusion.   Will check tox screen. Continue hydration. Check cortisol level. Low threshold to give stress-dose steroids. Follow lactate and ABGs. Watch CBGs closely.   Mother updated. Have reached out to CCM to discuss.   Additional CCT 35 minutes 0 not including procedures.   Arvilla Meres, MD  5:48 PM

## 2019-12-27 NOTE — Progress Notes (Signed)
Arterial line out of pt's wrist on assessment, manual pressure held and site dressed by Malachy Chamber, RN

## 2019-12-27 NOTE — ED Notes (Signed)
Upon routine check Pt CBG 15, immedaiately gave 8 oz of orange juice and rechecked blood sugar. Second reading was 25. Administered amp of 50.   Pt had been A& O the whole time and throughout the day as he called often for ice water and to eat. Pt is lethargic but follows commands and is still A&O x 4

## 2019-12-27 NOTE — ED Provider Notes (Signed)
[facility] EMERGENCY DEPARTMENT Provider Note   CSN: [id number] Arrival date & time: 12/27/19  8657     History Chief Complaint  Patient presents with   Chest Pain    LVAD    GERREN HOFFMEIER is a 49 y.o. male.  Pt presents to the ED today with CP and back pain.  Pt said he developed pain this morning.  The pt said his pain is an 11/10.  He has an LVAD that was placed in 08/2017.  He is followed here in the LVAD clinic.          Past Medical History:  Diagnosis Date   Asthma    CHF (congestive heart failure) (HCC)    a. 09/2016: EF 20-25% with cath showing normal cors   GERD (gastroesophageal reflux disease)    History of hiatal hernia    LVAD (left ventricular assist device) present (HCC)    OSA on CPAP 09/06/2018   Severe OSA with AHI 68/hr on CPAP at 12cm H2O    Patient Active Problem List   Diagnosis Date Noted   Generalized anxiety disorder 09/03/2019   Cigarette smoker 08/18/2019   COPD (chronic obstructive pulmonary disease) (HCC) 08/18/2019   Hypertension 08/18/2019   Bacteremia due to methicillin susceptible Staphylococcus aureus (MSSA) 08/18/2019   Sepsis (HCC) 08/16/2019   Increased abdominal girth 10/11/2018   Chronic constipation 10/11/2018   Rectal bleeding 10/11/2018   History of colonic polyps 09/24/2018   Hemorrhoids 09/24/2018   Long term current use of anticoagulant therapy 09/24/2018   OSA on CPAP 09/06/2018   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/26/2017   Anxiety 09/30/2017   Normocytic anemia 09/30/2017   LVAD (left ventricular assist device) present [facility])    Advanced care planning/counseling discussion    Goals of care, counseling/discussion    Palliative care encounter    Elevated LFTs 08/25/2017    Past Surgical History:  Procedure  Laterality Date   APPLICATION OF A-CELL OF CHEST/ABDOMEN N/A 08/24/2019   Procedure: Application Of A-Cell Of Chest/Abdomen;  Surgeon: Kerin Perna, MD;  Location: [facility] OR;  Service: Thoracic;  Laterality: N/A;   APPLICATION OF A-CELL OF CHEST/ABDOMEN N/A 09/05/2019   Procedure: Application Of A-Cell Of Chest/Abdomen;  Surgeon: Peggye Form, DO;  Location: [facility] OR;  Service: Plastics;  Laterality: N/A;   APPLICATION OF A-CELL OF CHEST/ABDOMEN  09/03/2019   Procedure: Application Of A-Cell Of Chest/Abdomen;  Surgeon: Kerin Perna, MD;  Location: [facility] OR;  Service: Thoracic;;   APPLICATION OF WOUND VAC N/A 08/22/2019   Procedure: Debridement, Irrigation and Packing of Abdominal Incision.;  Surgeon: Kerin Perna, MD;  Location: [facility] OR;  Service: Thoracic;  Laterality: N/A;   APPLICATION OF WOUND VAC N/A 08/24/2019   Procedure: Irrigation and Debridement with WOUND VAC APPLICATION;  Surgeon: Kerin Perna, MD;  Location: [facility] OR;  Service: Thoracic;  Laterality: N/A;   APPLICATION OF WOUND VAC N/A 08/30/2019   Procedure: APPLICATION OF WOUND VAC;  Surgeon: Kerin Perna, MD;  Location: [facility] OR;  Service: Thoracic;  Laterality: N/A;   APPLICATION OF WOUND VAC N/A 09/03/2019   Procedure: WOUND VAC CHANGE;  Surgeon: Kerin Perna, MD;  Location: [facility] OR;  Service: Thoracic;  Laterality: N/A;   BIOPSY  08/23/2018   Procedure: BIOPSY;  Surgeon: Lemar Lofty., MD;  Location: [facility] ENDOSCOPY;  Service:  Gastroenterology;;   COLONOSCOPY N/A 08/23/2018   Procedure: COLONOSCOPY;  Surgeon: Meridee Score Netty Starring., MD;  Location: [facility] ENDOSCOPY;  Service: Gastroenterology;  Laterality: N/A;   ENTEROSCOPY N/A 08/23/2018   Procedure: ENTEROSCOPY;  Surgeon: Meridee Score Netty Starring., MD;  Location: [facility] ENDOSCOPY;  Service: Gastroenterology;  Laterality: N/A;   HEMOSTASIS CLIP PLACEMENT  08/23/2018   Procedure: HEMOSTASIS CLIP PLACEMENT;  Surgeon: Lemar Lofty., MD;  Location: [facility] ENDOSCOPY;   Service: Gastroenterology;;   IABP INSERTION N/A 09/05/2017   Procedure: IABP INSERTION;  Surgeon: Dolores Patty, MD;  Location: [facility] INVASIVE CV LAB;  Service: Cardiovascular;  Laterality: N/A;   INSERTION OF IMPLANTABLE LEFT VENTRICULAR ASSIST DEVICE N/A 09/06/2017   Procedure: INSERTION OF IMPLANTABLE LEFT VENTRICULAR ASSIST DEVICE - HM3;  Surgeon: Kerin Perna, MD;  Location: [facility] OR;  Service: Open Heart Surgery;  Laterality: N/A;  HM3   MUSCLE FLAP CLOSURE N/A 09/05/2019   Procedure: MUSCLE FLAP CLOSURE;  Surgeon: Peggye Form, DO;  Location: [facility] OR;  Service: Plastics;  Laterality: N/A;   NASAL FRACTURE SURGERY  1987   POLYPECTOMY  08/23/2018   Procedure: POLYPECTOMY;  Surgeon: Mansouraty, Netty Starring., MD;  Location: [facility] ENDOSCOPY;  Service: Gastroenterology;;   RIGHT HEART CATH N/A 08/30/2017   Procedure: RIGHT HEART CATH;  Surgeon: Dolores Patty, MD;  Location: [facility] INVASIVE CV LAB;  Service: Cardiovascular;  Laterality: N/A;   RIGHT/LEFT HEART CATH AND CORONARY ANGIOGRAPHY N/A 10/08/2016   Procedure: Right/Left Heart Cath and Coronary Angiography;  Surgeon: Orpah Cobb, MD;  Location: [facility] INVASIVE CV LAB;  Service: Cardiovascular;  Laterality: N/A;   STERNAL WOUND DEBRIDEMENT N/A 08/20/2019   Procedure: DEBRIDEMENT OF LVAD DRIVELINE TUNNEL;  Surgeon: Kerin Perna, MD;  Location: [facility] OR;  Service: Thoracic;  Laterality: N/A;   STERNAL WOUND DEBRIDEMENT N/A 09/05/2019   Procedure: DEBRIDEMENT AND CLOSURE OF ABDOMINAL WOUND;  Surgeon: Peggye Form, DO;  Location: [facility] OR;  Service: Plastics;  Laterality: N/A;   SUBMUCOSAL TATTOO INJECTION  08/23/2018   Procedure: SUBMUCOSAL TATTOO INJECTION;  Surgeon: Meridee Score Netty Starring., MD;  Location: [facility] ENDOSCOPY;  Service: Gastroenterology;;   TEE WITHOUT CARDIOVERSION N/A 09/06/2017   Procedure: TRANSESOPHAGEAL ECHOCARDIOGRAM (TEE);  Surgeon: Donata Clay, Theron Arista, MD;  Location: [facility] OR;  Service: Open Heart Surgery;   Laterality: N/A;   WOUND DEBRIDEMENT N/A 08/30/2019   Procedure: Debridement Abdominal Wound;  Surgeon: Kerin Perna, MD;  Location: [facility] OR;  Service: Thoracic;  Laterality: N/A;       Family History  Problem Relation Age of Onset   Hypertension Father    Colon cancer Neg Hx    Esophageal cancer Neg Hx    Inflammatory bowel disease Neg Hx    Liver disease Neg Hx    Pancreatic cancer Neg Hx    Rectal cancer Neg Hx    Stomach cancer Neg Hx    Diabetes Neg Hx     Social History   Tobacco Use   Smoking status: Former Smoker    Packs/day: 0.50    Years: 25.00    Pack years: 12.50    Types: Cigarettes   Smokeless tobacco: Never Used   Tobacco comment: quit 08-16-2019  Vaping Use   Vaping Use: Never used  Substance Use Topics   Alcohol use: Not Currently    Comment: occas   Drug use: Not Currently    Home Medications Prior to Admission medications   Medication Sig Start Date End Date Taking? Authorizing Provider  albuterol (PROAIR  HFA) 108 (90 Base) MCG/ACT inhaler Inhale 2 puffs into the lungs every 6 (six) hours as needed for wheezing or shortness of breath. 11/13/19  Yes Bensimhon, Bevelyn Buckles, MD  cephALEXin (KEFLEX) 500 MG capsule Take 1 capsule (500 mg total) by mouth 3 (three) times daily. 10/15/19  Yes Cliffton Asters, MD  docusate sodium (STOOL SOFTENER) 100 MG capsule Take 1 capsule (100 mg total) by mouth 2 (two) times daily. Patient taking differently: Take 100 mg by mouth daily as needed for mild constipation.  12/06/19  Yes Bensimhon, Bevelyn Buckles, MD  gabapentin (NEURONTIN) 300 MG capsule TAKE ONE CAPSULE BY MOUTH THREE TIMES DAILY Patient taking differently: Take 300 mg by mouth 3 (three) times daily.  06/07/19  Yes Bensimhon, Bevelyn Buckles, MD  hydrALAZINE (APRESOLINE) 50 MG tablet Take 1 tablet (50 mg total) by mouth 3 (three) times daily. Patient taking differently: Take 50 mg by mouth daily.  05/01/19  Yes Robbie Lis M, PA-C  hydrOXYzine  (ATARAX/VISTARIL) 25 MG tablet Take 1 tablet (25 mg total) by mouth at bedtime as needed for anxiety. 09/13/19  Yes Clegg, Amy D, NP  linaclotide (LINZESS) 145 MCG CAPS capsule Take 1 capsule (145 mcg total) by mouth daily before breakfast. 06/11/19  Yes Mansouraty, Netty Starring., MD  potassium chloride SA (KLOR-CON) 20 MEQ tablet Take 20 mEq by mouth daily. 12/12/19  Yes [provider]  sildenafil (REVATIO) 20 MG tablet Take 1 tablet (20 mg total) by mouth 3 (three) times daily. 11/23/19  Yes Bensimhon, Bevelyn Buckles, MD  spironolactone (ALDACTONE) 25 MG tablet Take 1 tablet (25 mg total) by mouth daily. 09/14/19  Yes Clegg, Amy D, NP  torsemide (DEMADEX) 20 MG tablet Take 1 tablet (20 mg total) by mouth 2 (two) times a week. Take 1 tablet (20mg ) on Monday and Friday. 11/26/19  Yes Bensimhon, 01/27/20, MD  warfarin (COUMADIN) 5 MG tablet Take 5 mg (1 tab) every Monday, Wed, and Friday and 2.5 mg (0.5 tab) all other days or as directed by HF Clinic. Patient taking differently: Take 5 mg by mouth See admin instructions. Take 5 mg (1 tab) every Monday, Wed, and Friday and 2.5 mg (0.5 tab) all other days or as directed by HF Clinic. 11/30/19  Yes 12/02/19, RPH-CPP  DULoxetine (CYMBALTA) 60 MG capsule Take 1 capsule (60 mg total) by mouth daily. Patient not taking: Reported on 12/27/2019 10/16/19   Bensimhon, 12/16/19, MD  oxyCODONE 10 MG TABS Take 1 tablet (10 mg total) by mouth every 8 (eight) hours. Patient not taking: Reported on 11/23/2019 09/13/19   09/15/19 D, NP  pantoprazole (PROTONIX) 40 MG tablet Take 1 tablet (40 mg total) by mouth 2 (two) times daily. Patient not taking: Reported on 12/27/2019 10/11/19   Bensimhon, 10/13/19, MD  senna-docusate (SENOKOT-S) 8.6-50 MG tablet Take 1 tablet by mouth 2 (two) times daily. Patient not taking: Reported on 11/23/2019 09/13/19   09/15/19 D, NP  traMADol (ULTRAM) 50 MG tablet Take 2 tablets (100 mg total) by mouth every 6 (six) hours as needed for moderate  pain. Patient not taking: Reported on 11/23/2019 09/13/19   09/15/19 D, NP  zinc sulfate 220 (50 Zn) MG capsule Take 1 capsule (220 mg total) by mouth daily. Patient not taking: Reported on 12/27/2019 09/14/19   09/16/19, NP    Allergies    Patient has no known allergies.  Review of Systems   Review of Systems  Cardiovascular: Positive  for chest pain.  Musculoskeletal: Positive for back pain.  All other systems reviewed and are negative.   Physical Exam Updated Vital Signs BP 111/87    Pulse (!) 116    Temp 97.6 F (36.4 C)    Resp (!) 22    Ht 6\' 2"  (1.88 m)    Wt 98.9 kg    SpO2 95%    BMI 27.99 kg/m   Physical Exam Vitals and nursing note reviewed.  Constitutional:      General: He is in acute distress.     Appearance: He is ill-appearing.  HENT:     Head: Normocephalic and atraumatic.  Eyes:     Extraocular Movements: Extraocular movements intact.     Pupils: Pupils are equal, round, and reactive to light.  Cardiovascular:     Rate and Rhythm: Regular rhythm. Tachycardia present.     Comments: LVAD hum  LVAD in place Pulmonary:     Effort: Tachypnea present.  Abdominal:     General: Bowel sounds are normal.     Palpations: Abdomen is soft.  Musculoskeletal:        General: Normal range of motion.     Cervical back: Normal range of motion and neck supple.  Skin:    General: Skin is warm.     Capillary Refill: Capillary refill takes less than 2 seconds.  Neurological:     General: No focal deficit present.     Mental Status: He is alert.  Psychiatric:        Mood and Affect: Mood is anxious.     ED Results / Procedures / Treatments   Labs (all labs ordered are listed, but only abnormal results are displayed) Labs Reviewed  BASIC METABOLIC PANEL - Abnormal; Notable for the following components:      Result Value   CO2 14 (*)    Glucose, Bld 48 (*)    Creatinine, Ser 1.68 (*)    GFR calc non Af Amer 48 (*)    GFR calc Af Amer 56 (*)    Anion gap 19  (*)    All other components within normal limits  CBC - Abnormal; Notable for the following components:   WBC 11.3 (*)    RBC 4.00 (*)    Hemoglobin 9.9 (*)    HCT 34.7 (*)    MCH 24.8 (*)    MCHC 28.5 (*)    RDW 20.5 (*)    All other components within normal limits  PROTIME-INR - Abnormal; Notable for the following components:   Prothrombin Time 25.6 (*)    INR 2.4 (*)    All other components within normal limits  LACTATE DEHYDROGENASE - Abnormal; Notable for the following components:   LDH 282 (*)    All other components within normal limits  LACTIC ACID, PLASMA - Abnormal; Notable for the following components:   Lactic Acid, Venous 6.8 (*)    All other components within normal limits  I-STAT CHEM 8, ED - Abnormal; Notable for the following components:   Creatinine, Ser 1.40 (*)    Glucose, Bld 46 (*)    Calcium, Ion 0.96 (*)    TCO2 14 (*)    Hemoglobin 12.6 (*)    HCT 37.0 (*)    All other components within normal limits  TROPONIN I (HIGH SENSITIVITY) - Abnormal; Notable for the following components:   Troponin I (High Sensitivity) 39 (*)    All other components within normal limits  CULTURE, BLOOD (  ROUTINE X 2)  CULTURE, BLOOD (ROUTINE X 2)  SARS CORONAVIRUS 2 BY RT PCR (HOSPITAL ORDER, PERFORMED IN [facility])  LACTIC ACID, PLASMA  RAPID URINE DRUG SCREEN, HOSP PERFORMED  URINALYSIS, ROUTINE W REFLEX MICROSCOPIC  TYPE AND SCREEN  TROPONIN I (HIGH SENSITIVITY)    EKG EKG Interpretation  Date/Time:  Thursday December 27 2019 10:09:47 EDT Ventricular Rate:  126 PR Interval:    QRS Duration: 83 QT Interval:  301 QTC Calculation: 436 R Axis:   175 Text Interpretation: Sinus tachycardia Ventricular premature complex Prolonged PR interval LAE, consider biatrial enlargement Anterolateral infarct, age indeterminate Artifact in lead(s) I II III aVR aVL aVF V2 V3 V4 V5 V6 LVAD in place No significant change since last tracing Confirmed by Jacalyn Lefevre  [phone]) on 12/27/2019 10:11:33 AM   Radiology DG Chest Port 1 View  Result Date: 12/27/2019 CLINICAL DATA:  Chest pain shortness of breath. EXAM: PORTABLE CHEST 1 VIEW COMPARISON:  Chest radiograph 11/13/2019. FINDINGS: Stable cardiomegaly status post median sternotomy. LVAD device in place. Monitoring leads overlie the patient. Persistent heterogeneous opacities left lung base. Small left pleural effusion. No pneumothorax. IMPRESSION: Similar-appearing cardiomegaly, small left effusion and underlying opacities which may represent atelectasis. Electronically Signed   By: Annia Belt M.D.   On: 12/27/2019 10:17   CT Angio Chest/Abd/Pel for Dissection W and/or Wo Contrast  Result Date: 12/27/2019 CLINICAL DATA:  Severe left upper chest and shoulder pain. Left upper back pain. EXAM: CT ANGIOGRAPHY CHEST, ABDOMEN AND PELVIS TECHNIQUE: Non-contrast CT of the chest was initially obtained. Multidetector CT imaging through the chest, abdomen and pelvis was performed using the standard protocol during bolus administration of intravenous contrast. Multiplanar reconstructed images and MIPs were obtained and reviewed to evaluate the vascular anatomy. CONTRAST:  OMNIPAQUE IOHEXOL 350 MG/ML SOLN COMPARISON:  11/14/2019 FINDINGS: CTA CHEST FINDINGS Cardiovascular: Portions of the inferior heart and inferior thorax are obscured by beam hardening artifact from left ventricular assist device. The heart is markedly enlarged. No no substantial pericardial effusion. Precontrast imaging shows no hyperattenuating rim associated with the thoracic aorta to suggest acute intramural hematoma. Postcontrast imaging shows no thoracic aortic dissection. There is no thoracic aortic aneurysm. Left ventricular assist device evident. No large central, lobar, or segmental pulmonary embolus. Subsegmental pulmonary arteries are not reliably evaluated on this study. Mediastinum/Nodes: No mediastinal lymphadenopathy. There is no hilar  lymphadenopathy. The esophagus has normal imaging features. There is no axillary lymphadenopathy. Lungs/Pleura: Fine detail of lung parenchyma is obscured by breathing motion during image acquisition. Paraseptal emphysema noted with some mild bullous change anteriorly in both lungs. Right lower lobe irregular subpleural opacity measuring 2.9 x 0.7 cm today is similar to prior study and may be scarring. There is some minimal atelectasis at the left base with stable small left pleural effusion. Tiny right pleural effusion seen previously has resolved in the interval. Musculoskeletal: No worrisome lytic or sclerotic osseous abnormality. Similar appearance of the subcutaneous fluid collection superficial to the sternum measuring 13.1 x 9.2 x 3.0 cm today compared to 13.7 x 10.1 x 2.6 cm previously. No fluid collection over the left pectoralis major today. There is bilateral gynecomastia. Review of the MIP images confirms the above findings. CTA ABDOMEN AND PELVIS FINDINGS VASCULAR Aorta: Normal caliber aorta without aneurysm, dissection, vasculitis or significant stenosis. Celiac: Patent without evidence of aneurysm, dissection, vasculitis or significant stenosis. SMA: Patent without evidence of aneurysm, dissection, vasculitis or significant stenosis. Renals: Both renal arteries are patent without  evidence of aneurysm, dissection, vasculitis, fibromuscular dysplasia or significant stenosis. IMA: Patent without evidence of aneurysm, dissection, vasculitis or significant stenosis. Inflow: Atherosclerotic disease but widely patent. Veins: No obvious venous abnormality within the limitations of this arterial phase study. Review of the MIP images confirms the above findings. NON-VASCULAR Hepatobiliary: Portions of the liver are obscured by beam hardening artifact. There is no evidence for gallstones, gallbladder wall thickening, or pericholecystic fluid. No intrahepatic or extrahepatic biliary dilation. Pancreas: Fatty  replaced without main duct dilatation. Spleen: No splenomegaly. No focal mass lesion. Adrenals/Urinary Tract: Bilateral adrenal thickening without a discrete mass lesion evident. No evidence for hydronephrosis in either kidney. Stable small cyst upper pole left kidney. No evidence for hydroureter. Bladder is decompressed. Stomach/Bowel: Proximal stomach is obscured. Distal stomach unremarkable. Duodenum is normally positioned as is the ligament of Treitz. No small bowel wall thickening. No small bowel dilatation. The terminal ileum is normal. The appendix is not visualized, but there is no edema or inflammation in the region of the cecum. No gross colonic mass. No colonic wall thickening. Lymphatic: There is no gastrohepatic or hepatoduodenal ligament lymphadenopathy. No retroperitoneal or mesenteric lymphadenopathy. No pelvic sidewall lymphadenopathy. Reproductive: The prostate gland and seminal vesicles are unremarkable. Other: Small volume free fluid seen in the anterior pelvis bilaterally. 2 cm calcified nodule in the central small bowel mesentery is stable. Musculoskeletal: No worrisome lytic or sclerotic osseous abnormality. The linear fluid collection or granulation tissue seen along the drive line previously is stable in the interval. Review of the MIP images confirms the above findings. IMPRESSION: 1. No evidence for thoracoabdominal aortic aneurysm or dissection. No acute intramural hematoma. No findings to explain the patient's history of left-sided chest and shoulder pain. 2. Marked cardiomegaly with left ventricular assist device. 3. Stable small left pleural effusion with interval resolution of the tiny right pleural effusion seen previously. 4. The subcutaneous fluid collection superficial to the sternum is not substantially changed in the interval. 5. Small volume free fluid in the anterior pelvis bilaterally, nonspecific. 6. Aortic Atherosclerosis (ICD10-I70.0) and Emphysema (ICD10-J43.9).  Electronically Signed   By: Kennith Center M.D.   On: 12/27/2019 11:01    Procedures Procedures (including critical care time)  Medications Ordered in ED Medications  vancomycin (VANCOREADY) IVPB 2000 mg/400 mL (has no administration in time range)  LORazepam (ATIVAN) injection 0.5 mg (0.5 mg Intravenous Given 12/27/19 1049)  iohexol (OMNIPAQUE) 350 MG/ML injection 100 mL (100 mLs Intravenous Contrast Given 12/27/19 1040)  sodium chloride 0.9 % bolus 500 mL (0 mLs Intravenous Stopped 12/27/19 1221)  morphine 4 MG/ML injection 4 mg (4 mg Intravenous Given 12/27/19 1113)  ondansetron (ZOFRAN) injection 4 mg (4 mg Intravenous Given 12/27/19 1108)  ceFEPIme (MAXIPIME) 2 g in sodium chloride 0.9 % 100 mL IVPB (2 g Intravenous New Bag/Given 12/27/19 1202)    ED Course  I have reviewed the triage vital signs and the nursing notes.  Pertinent labs & imaging results that were available during my care of the patient were reviewed by me and considered in my medical decision making (see chart for details).    MDM Rules/Calculators/A&P                          Due to CP and back pain with hypotension, I ordered a CT for dissection study.  Fortunately, pt does not have a dissection.  He does look to have a possible pocket infection where he had a previous MSSA  infection requiring debridement and a pec muscle flap.  LVAD team ordered maxipime and vancomycin for abx,.  Pt given IVFs, but not sepsis fluids due to severe CHF requiring LVAD.  BP has improved.  HR is still tachy, but improved.   RR much better.   Pt looks much more comfortable in the room.  Dr. [name] will admit.   CRITICAL CARE Performed by: Jacalyn Lefevre   Total critical care time: 30 minutes  Critical care time was exclusive of separately billable procedures and treating other patients.  Critical care was necessary to treat or prevent imminent or life-threatening deterioration.  Critical care was time spent personally by me on the  following activities: development of treatment plan with patient and/or surrogate as well as nursing, discussions with consultants, evaluation of patient's response to treatment, examination of patient, obtaining history from patient or surrogate, ordering and performing treatments and interventions, ordering and review of laboratory studies, ordering and review of radiographic studies, pulse oximetry and re-evaluation of patient's condition.  Final Clinical Impression(s) / ED Diagnoses Final diagnoses:  Infection associated with driveline of left ventricular assist device (LVAD) (HCC)  Anticoagulated on Coumadin    Rx / DC Orders ED Discharge Orders    None       Jacalyn Lefevre, MD 12/27/19 1257

## 2019-12-27 NOTE — Progress Notes (Signed)
ABG results are actually venous blood gas results. MD made aware.

## 2019-12-27 NOTE — ED Notes (Signed)
Got patient on the monitor did ekg shopwn to Dr Particia Nearing patient is resting with call bell in reach and family at bedside

## 2019-12-27 NOTE — Progress Notes (Signed)
Pharmacy Antibiotic Note  Jonathan Wood is a 49 y.o. male admitted on 12/27/2019 with concern for pocket infection.  Pharmacy has been consulted for vancomycin and cefepime dosing.  Presenting with upper wall chest pain radiating into his back. 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 WBC 11.3, afebrile. LA elevated at 6.8. Scr 1.68 (CrCl 69 mL/min).   Plan: Vancomycin 2g IV once then 1250 mg IV every 12 hours  Cefepime 2g IV every 8 hours  Monitor renal fx, cx results, clinical pic, and level as appropriate   Height: 6\' 2"  (188 cm) Weight: 98.9 kg (218 lb) IBW/kg (Calculated) : 82.2  Temp (24hrs), Avg:97.6 F (36.4 C), Min:97.6 F (36.4 C), Max:97.6 F (36.4 C)  Recent Labs  Lab 12/27/19 1017 12/27/19 1022  WBC  --  11.3*  CREATININE 1.40* 1.68*  LATICACIDVEN  --  6.8*    Estimated Creatinine Clearance: 69.1 mL/min (A) (by C-G formula based on SCr of 1.68 mg/dL (H)).    No Known Allergies  Antimicrobials this admission: Cefepime 8/12 >>  Vancomycin 8/12 >>   Dose adjustments this admission: N/A  Microbiology results: 8/12 BCx: sent  Thank you for allowing pharmacy to be a part of this patient's care.  10/12, PharmD, BCCCP Clinical Pharmacist  Phone: [phone] 12/27/2019 12:11 PM  Please check AMION for all [facility] Pharmacy phone numbers After 10:00 PM, call Main Pharmacy [phone]

## 2019-12-27 NOTE — Progress Notes (Signed)
LVAD Coordinator ED Encounter  Jonathan Wood a 49 y.o. male that presented to [facility] ER today due to upper left wall chest pain that radiates to his back. He  has a past medical history of Asthma, CHF (congestive heart failure) (HCC), GERD (gastroesophageal reflux disease), History of hiatal hernia, LVAD (left ventricular assist device) present (HCC), and OSA on CPAP (09/06/2018).Marland Kitchen   LVAD is a HM3 and was implanted on 09/06/17 by Dr. Donata Clay.    Vital signs: HR: 133 Doppler MAP: 70 Automated BP: 108/26 (36) O2 Sat: 95 % on RA  LVAD interrogation reveals:  Speed: 5600 Flow: 4.9 Power:  4.3 PI: 2.9  Alarms: none Events: rare  Drive Line: Existing VAD dressing removed and site care performed using sterile technique. Drive line exit site cleaned with Chlora prep applicators x 2, allowed to dry, and Sorbaview dressing with bio patch re-applied. Exit site healed and incorporated, the velour is fully implanted at exit site. No redness, tenderness, drainage, foul odor or rash noted. Drive line anchor re-applied.   Significant Events with LVAD: pt has had chronic infections of driveline inov Recently admitted 08/16/19-09/13/19 for DL infection with large subxiphoid abscess. Underwent several debridements and eventually a pec muscle flap. Wound cx + MSSA. Initially started on ancef and rifabutin but expanded to cefipime for a week then changed to IV ancef and po rifabutin. Now on oral keflex and rifabutin  Readmitted 11/13/19-11/16/19 with superficial abscess and tachypnea. Superficial abscess I&D'd and packed. CT scan done and showed possible deeper fluid collection. Reviewed with TCTS who felt it was muscle flap. Sent to IR for possible drainage but u/s confirmed it was the muscle flap and no drainable fluid collection. Wound cx negative. ID saw and recommended continuing Keflex.   Updated VAD Providers Dr. [name] and [place] about the above. No LVAD issues and pump is functioning as  expected. Able to independently manage LVAD equipment. No LVAD needs at this time.   Will admit pt for infection with lactic acid of 6.8. Surgeon - DR. Atkins reviewed scan and aware of admission.  Carlton Adam, RN VAD Coordinator   Office: [phone] 24/7 Emergency VAD Pager: [phone]

## 2019-12-27 NOTE — Procedures (Signed)
Central Venous Catheter Insertion Procedure Note  Jonathan Wood  [id number]  [birth year]  Date:12/27/19  Time:9:52 PM   Provider Performing:Lachrisha Ziebarth Judie Petit Janyth Contes   Procedure: Insertion of Non-tunneled Central Venous [phone]) with US guidance ([id number])   Indication(s) Medication administration  Consent Risks of the procedure as well as the alternatives and risks of each were explained to the patient and/or caregiver.  Consent for the procedure was obtained and is signed in the bedside chart  Anesthesia Topical only with 1% lidocaine   Timeout Verified patient identification, verified procedure, site/side was marked, verified correct patient position, special equipment/implants available, medications/allergies/relevant history reviewed, required imaging and test results available.  Sterile Technique Maximal sterile technique including full sterile barrier drape, hand hygiene, sterile gown, sterile gloves, mask, hair covering, sterile ultrasound probe cover (if used).  Procedure Description Area of catheter insertion was cleaned with chlorhexidine and draped in sterile fashion.  With real-time ultrasound guidance a central venous catheter was placed into the right internal jugular vein. Nonpulsatile blood flow and easy flushing noted in all ports.  The catheter was sutured in place and sterile dressing applied.  Complications/Tolerance None; patient tolerated the procedure well. Chest X-ray is ordered to verify placement for internal jugular or subclavian cannulation.   Chest x-ray is not ordered for femoral cannulation.  EBL Minimal  Specimen(s) None

## 2019-12-27 NOTE — H&P (Addendum)
Advanced Heart Failure VAD History and Physical Note   PCP-Cardiologist: Arvilla Meres, MD   Reason for Admission: Lactic Acidosis   HPI:    Jonathan Wood is a 49 y.o. 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  Admitted 08/21/2018 with volume overload and anemia. Diuresed with IV lasix and transitioned to torsemide 20 mg daily. GI consulted EGD completed and showed gastritis and nonbleeding duodenal ulcers, colonoscopy with 5 polyps removed. Protonix increased to BID, he was started on PO iron,and hydrocortisone suppositories for hemorrhoid treatment. He received a total of 2 uPRBCs. He will stay off ASA forseveral weeksbut can reconsider later.  Discharge weight was 221 pounds.   Admitted 12/7-15/20 for DL infection with overlying cellulitis and volume overload. Treated with IV abx and diuresis.   Recently admitted 08/16/19-09/13/19 for DL infection with large subxiphoid abscess. Underwent several debridements and eventually a pec muscle flap. Wound cx + MSSA. Initially started on ancef and rifabutin but expanded to cefipime for a week then changed to IV ancef and po rifabutin. Now on oral keflex and rifabutin  Readmitted 11/13/19-11/16/19 with superficial abscess and tachypnea. Superficial abscess I&D'd and packed. CT scan done and showed possible deeper fluid collection. Reviewed with TCTS who felt it was muscle flap. Sent to IR for possible drainage but u/s confirmed it was the muscle flap and no drainable fluid collection. Wound cx negative. ID saw and recommended continuing Keflex.   He now presents to ED w/ severe left upper chest/ shoulder pain. Started yesterday and has been persistent. Described and crushing like pain. Severe w/ mild associated dyspnea.   In ED, he is tachycardic and tachypneic. SBP 180s. Left upper anterior chest tender to palpation. He is Afebrile. WBC 11.3.  Initial Hgb 12.6>>9.9. SCr 1.40>>1.68. Lactic acid 6.8>>8.5. LDH 282. Hs Trop 39>>37. EKG shows sinus tach 126 bpm.   STAT CT of C/A/P negative for dissection. The subcutaneous fluid collection superficial to the sternum is not substantially changed from previous study. No acute abdominal process.    LVAD INTERROGATION:  HeartMate II LVAD:  Flow 4.9 liters/min, speed 5650, power 4.1, PI 2.9   Review of Systems: [y] = yes,  = no   General: Weight gain ; Weight loss ; Anorexia ; Fatigue ; Fever ; Chills ; Weakness   Cardiac: Chest pain/pressure [Y ]; Resting SOB [Y ]; Exertional SOB ; Orthopnea ; Pedal Edema ; Palpitations ; Syncope ; Presyncope ; Paroxysmal nocturnal dyspnea[ ]   Pulmonary: Cough ; Wheezing[ ] ; Hemoptysis[ ] ; Sputum ; Snoring   GI: Vomiting[ ] ; Dysphagia[ ] ; Melena[ ] ; Hematochezia ; Heartburn[ ] ; Abdominal pain ; Constipation [Y ]; Diarrhea ; BRBPR   GU: Hematuria[ ] ; Dysuria ; Nocturia[ ]   Vascular: Pain in legs with walking ; Pain in feet with lying flat ; Non-healing sores ; Stroke ; TIA ; Slurred speech ;  Neuro: Headaches[ ] ; Vertigo[ ] ; Seizures[ ] ; Paresthesias[ ] ;Blurred vision ; Diplopia ; Vision changes   Ortho/Skin: Arthritis ; Joint pain ; Muscle pain ; Joint swelling ; Back Pain ; Rash   Psych: Depression[ ] ; Anxiety[ ]   Heme: Bleeding problems ; Clotting disorders ; Anemia   Endocrine: Diabetes ;  Thyroid dysfunction[ ]     Home Medications Prior to Admission medications   Medication Sig Start Date End Date Taking? Authorizing Provider  albuterol (PROAIR HFA) 108 (90 Base) MCG/ACT inhaler Inhale 2 puffs into the lungs every 6 (six) hours as needed for wheezing or shortness of breath. 11/13/19   Kensy Blizard, Bevelyn Buckles, MD  cephALEXin (KEFLEX) 500 MG capsule Take 1 capsule (500 mg total) by mouth 3 (three) times daily. 10/15/19   Cliffton Asters,  MD  docusate sodium (STOOL SOFTENER) 100 MG capsule Take 1 capsule (100 mg total) by mouth 2 (two) times daily. 12/06/19   Jerelene Salaam, Bevelyn Buckles, MD  DULoxetine (CYMBALTA) 60 MG capsule Take 1 capsule (60 mg total) by mouth daily. 10/16/19   Manaia Samad, Bevelyn Buckles, MD  gabapentin (NEURONTIN) 300 MG capsule TAKE ONE CAPSULE BY MOUTH THREE TIMES DAILY Patient taking differently: Take 300 mg by mouth 3 (three) times daily.  06/07/19   Devonia Farro, Bevelyn Buckles, MD  hydrALAZINE (APRESOLINE) 50 MG tablet Take 1 tablet (50 mg total) by mouth 3 (three) times daily. 05/01/19   Robbie Lis M, PA-C  hydrOXYzine (ATARAX/VISTARIL) 25 MG tablet Take 1 tablet (25 mg total) by mouth at bedtime as needed for anxiety. 09/13/19   Tonye Becket D, NP  linaclotide (LINZESS) 145 MCG CAPS capsule Take 1 capsule (145 mcg total) by mouth daily before breakfast. Patient not taking: Reported on 11/23/2019 06/11/19   Mansouraty, Netty Starring., MD  oxyCODONE 10 MG TABS Take 1 tablet (10 mg total) by mouth every 8 (eight) hours. Patient not taking: Reported on 11/23/2019 09/13/19   Tonye Becket D, NP  pantoprazole (PROTONIX) 40 MG tablet Take 1 tablet (40 mg total) by mouth 2 (two) times daily. 10/11/19   Almalik Weissberg, Bevelyn Buckles, MD  senna-docusate (SENOKOT-S) 8.6-50 MG tablet Take 1 tablet by mouth 2 (two) times daily. Patient not taking: Reported on 11/23/2019 09/13/19   Tonye Becket D, NP  sildenafil (REVATIO) 20 MG tablet Take 1 tablet (20 mg total) by mouth 3 (three) times daily. 11/23/19   Criss Pallone, Bevelyn Buckles, MD  spironolactone (ALDACTONE) 25 MG tablet Take 1 tablet (25 mg total) by mouth daily. 09/14/19   Clegg, Amy D, NP  torsemide (DEMADEX) 20 MG tablet Take 1 tablet (20 mg total) by mouth 2 (two) times a week. Take 1 tablet (20mg ) on Monday and Friday. 11/26/19   Jayda White, Bevelyn Buckles, MD  traMADol (ULTRAM) 50 MG tablet Take 2 tablets (100 mg total) by mouth every 6 (six) hours as needed for moderate pain. Patient not taking: Reported on 11/23/2019  09/13/19   Tonye Becket D, NP  warfarin (COUMADIN) 5 MG tablet Take 5 mg (1 tab) every Monday, Wed, and Friday and 2.5 mg (0.5 tab) all other days or as directed by HF Clinic. 11/30/19   Evon Slack, RPH-CPP  zinc sulfate 220 (50 Zn) MG capsule Take 1 capsule (220 mg total) by mouth daily. 09/14/19   Tonye Becket D, NP    Past Medical History: Past Medical History:  Diagnosis Date   Asthma    CHF (congestive heart failure) (HCC)    a. 09/2016: EF 20-25% with cath showing normal cors   GERD (gastroesophageal reflux disease)    History of hiatal hernia    LVAD (left ventricular assist device) present (HCC)    OSA on CPAP 09/06/2018   Severe OSA with AHI 68/hr on CPAP at 12cm H2O    Past Surgical History: Past Surgical History:  Procedure Laterality Date  APPLICATION OF A-CELL OF CHEST/ABDOMEN N/A 08/24/2019   Procedure: Application Of A-Cell Of Chest/Abdomen;  Surgeon: Kerin Perna, MD;  Location: [facility] OR;  Service: Thoracic;  Laterality: N/A;   APPLICATION OF A-CELL OF CHEST/ABDOMEN N/A 09/05/2019   Procedure: Application Of A-Cell Of Chest/Abdomen;  Surgeon: Peggye Form, DO;  Location: [facility] OR;  Service: Plastics;  Laterality: N/A;   APPLICATION OF A-CELL OF CHEST/ABDOMEN  09/03/2019   Procedure: Application Of A-Cell Of Chest/Abdomen;  Surgeon: Kerin Perna, MD;  Location: [facility] OR;  Service: Thoracic;;   APPLICATION OF WOUND VAC N/A 08/22/2019   Procedure: Debridement, Irrigation and Packing of Abdominal Incision.;  Surgeon: Kerin Perna, MD;  Location[facility] OR;  Service: Thoracic;  Laterality: N/A;   APPLICATION OF WOUND VAC N/A 08/24/2019   Procedure: Irrigation and Debridement with WOUND VAC APPLICATION;  Surgeon: Kerin Perna, MD;  Location: [facility] OR;  Service: Thoracic;  Laterality: N/A;   APPLICATION OF WOUND VAC N/A 08/30/2019   Procedure: APPLICATION OF WOUND VAC;  Surgeon: Kerin Perna, MD;  Location: [facility] OR;  Service: Thoracic;  Laterality: N/A;    APPLICATION OF WOUND VAC N/A 09/03/2019   Procedure: WOUND VAC CHANGE;  Surgeon: Kerin Perna, MD;  Location: [facility] OR;  Service: Thoracic;  Laterality: N/A;   BIOPSY  08/23/2018   Procedure: BIOPSY;  Surgeon: Lemar Lofty., MD;  Location: [facility] ENDOSCOPY;  Service: Gastroenterology;;   COLONOSCOPY N/A 08/23/2018   Procedure: COLONOSCOPY;  Surgeon: Lemar Lofty., MD;  Location: [facility] ENDOSCOPY;  Service: Gastroenterology;  Laterality: N/A;   ENTEROSCOPY N/A 08/23/2018   Procedure: ENTEROSCOPY;  Surgeon: Meridee Score Netty Starring., MD;  Location: [facility] ENDOSCOPY;  Service: Gastroenterology;  Laterality: N/A;   HEMOSTASIS CLIP PLACEMENT  08/23/2018   Procedure: HEMOSTASIS CLIP PLACEMENT;  Surgeon: Lemar Lofty., MD;  Location: [facility] ENDOSCOPY;  Service: Gastroenterology;;   IABP INSERTION N/A 09/05/2017   Procedure: IABP INSERTION;  Surgeon: Dolores Patty, MD;  Location: [facility] INVASIVE CV LAB;  Service: Cardiovascular;  Laterality: N/A;   INSERTION OF IMPLANTABLE LEFT VENTRICULAR ASSIST DEVICE N/A 09/06/2017   Procedure: INSERTION OF IMPLANTABLE LEFT VENTRICULAR ASSIST DEVICE - HM3;  Surgeon: Kerin Perna, MD;  Location: [facility] OR;  Service: Open Heart Surgery;  Laterality: N/A;  HM3   MUSCLE FLAP CLOSURE N/A 09/05/2019   Procedure: MUSCLE FLAP CLOSURE;  Surgeon: Peggye Form, DO;  Location: [facility] OR;  Service: Plastics;  Laterality: N/A;   NASAL FRACTURE SURGERY  1987   POLYPECTOMY  08/23/2018   Procedure: POLYPECTOMY;  Surgeon: Mansouraty, Netty Starring., MD;  Location: [facility] ENDOSCOPY;  Service: Gastroenterology;;   RIGHT HEART CATH N/A 08/30/2017   Procedure: RIGHT HEART CATH;  Surgeon: Dolores Patty, MD;  Location: [facility] INVASIVE CV LAB;  Service: Cardiovascular;  Laterality: N/A;   RIGHT/LEFT HEART CATH AND CORONARY ANGIOGRAPHY N/A 10/08/2016   Procedure: Right/Left Heart Cath and Coronary Angiography;  Surgeon: Orpah Cobb, MD;  Location: [facility] INVASIVE CV LAB;  Service:  Cardiovascular;  Laterality: N/A;   STERNAL WOUND DEBRIDEMENT N/A 08/20/2019   Procedure: DEBRIDEMENT OF LVAD DRIVELINE TUNNEL;  Surgeon: Kerin Perna, MD;  Location: [facility] OR;  Service: Thoracic;  Laterality: N/A;   STERNAL WOUND DEBRIDEMENT N/A 09/05/2019   Procedure: DEBRIDEMENT AND CLOSURE OF ABDOMINAL WOUND;  Surgeon: Peggye Form, DO;  Location: [facility] OR;  Service: Plastics;  Laterality: N/A;   SUBMUCOSAL TATTOO INJECTION  08/23/2018   Procedure: SUBMUCOSAL TATTOO INJECTION;  Surgeon: Corliss Parish  Montez Hageman., MD;  Location: [facility] ENDOSCOPY;  Service: Gastroenterology;;   TEE WITHOUT CARDIOVERSION N/A 09/06/2017   Procedure: TRANSESOPHAGEAL ECHOCARDIOGRAM (TEE);  Surgeon: Donata Clay, Theron Arista, MD;  Location: [facility] OR;  Service: Open Heart Surgery;  Laterality: N/A;   WOUND DEBRIDEMENT N/A 08/30/2019   Procedure: Debridement Abdominal Wound;  Surgeon: Kerin Perna, MD;  Location: [facility] OR;  Service: Thoracic;  Laterality: N/A;    Family History: Family History  Problem Relation Age of Onset   Hypertension Father    Colon cancer Neg Hx    Esophageal cancer Neg Hx    Inflammatory bowel disease Neg Hx    Liver disease Neg Hx    Pancreatic cancer Neg Hx    Rectal cancer Neg Hx    Stomach cancer Neg Hx    Diabetes Neg Hx     Social History: Social History   Socioeconomic History   Marital status: Legally Separated    Spouse name: Not on file   Number of children: 3   Years of education: Not on file   Highest education level: Not on file  Occupational History   Not on file  Tobacco Use   Smoking status: Former Smoker    Packs/day: 0.50    Years: 25.00    Pack years: 12.50    Types: Cigarettes   Smokeless tobacco: Never Used   Tobacco comment: quit 08-16-2019  Vaping Use   Vaping Use: Never used  Substance and Sexual Activity   Alcohol use: Not Currently    Comment: occas   Drug use: Not Currently   Sexual activity: Yes  Other Topics Concern   Not on  file  Social History Narrative   Lives at home with his three children (8, 63, and 26 years of age).    Social Determinants of Health   Financial Resource Strain:    Difficulty of Paying Living Expenses:   Food Insecurity:    Worried About Programme researcher, broadcasting/film/video in the Last Year:    Barista in the Last Year:   Transportation Needs:    Freight forwarder (Medical):    Lack of Transportation (Non-Medical):   Physical Activity:    Days of Exercise per Week:    Minutes of Exercise per Session:   Stress:    Feeling of Stress :   Social Connections:    Frequency of Communication with Friends and Family:    Frequency of Social Gatherings with Friends and Family:    Attends Religious Services:    Active Member of Clubs or Organizations:    Attends Banker Meetings:    Marital Status:     Allergies:  No Known Allergies  Objective:    Vital Signs:   Temp:  [97.6 F (36.4 C)] 97.6 F (36.4 C) (08/12 1013) Pulse Rate:  [116-133] 116 (08/12 1046) Resp:  [25-32] 27 (08/12 1115) BP: (70-108)/(0-78) 103/78 (08/12 1115) SpO2:  [95 %-100 %] 95 % (08/12 1046) Weight:  [98.9 kg] 98.9 kg (08/12 1115)   Filed Weights   12/27/19 1115  Weight: 98.9 kg    Mean arterial Pressure 82   Physical Exam    General:  Uncomfortable appearing but non-toxic looking, no respiratory difficulty  HEENT: Normal Neck: supple. No JVP . Carotids 2+ bilat; no bruits. No lymphadenopathy or thyromegaly appreciated. Cor: Mechanical heart sounds with LVAD hum present. Lungs: Clear, no wheezing Abdomen: distended, nontender, nondistended. No hepatosplenomegaly. No bruits or masses. Good bowel sounds. + subxiphoid  chronic swelling, no increased warthm Driveline: C/D/I; securement device intact and driveline incorporated Extremities: no cyanosis, clubbing, rash, edema Neuro: alert & orientedx3, cranial nerves grossly intact. moves all 4 extremities w/o difficulty. Affect  pleasant   Telemetry   Sinus tach 100s   EKG   Sinus tach 126 bpm   Labs    Basic Metabolic Panel: Recent Labs  Lab 12/27/19 1017 12/27/19 1022  NA 136 135  K 3.8 3.8  CL 106 102  CO2  --  14*  GLUCOSE 46* 48*  BUN 12 13  CREATININE 1.40* 1.68*  CALCIUM  --  9.4    Liver Function Tests: No results for input(s): AST, ALT, ALKPHOS, BILITOT, PROT, ALBUMIN in the last 168 hours. No results for input(s): LIPASE, AMYLASE in the last 168 hours. No results for input(s): AMMONIA in the last 168 hours.  CBC: Recent Labs  Lab 12/27/19 1017 12/27/19 1022  WBC  --  11.3*  HGB 12.6* 9.9*  HCT 37.0* 34.7*  MCV  --  86.8  PLT  --  215    Cardiac Enzymes: No results for input(s): CKTOTAL, CKMB, CKMBINDEX, TROPONINI in the last 168 hours.  BNP: BNP (last 3 results) Recent Labs    03/21/19 1415 04/23/19 1350  BNP 426.9* 709.6*    ProBNP (last 3 results) No results for input(s): PROBNP in the last 8760 hours.   CBG: No results for input(s): GLUCAP in the last 168 hours.  Coagulation Studies: Recent Labs    12/27/19 1022  LABPROT 25.6*  INR 2.4*    Other results: EKG: sinus tachycardiac 126 bpm .   Imaging    DG Chest Port 1 View  Result Date: 12/27/2019 CLINICAL DATA:  Chest pain shortness of breath. EXAM: PORTABLE CHEST 1 VIEW COMPARISON:  Chest radiograph 11/13/2019. FINDINGS: Stable cardiomegaly status post median sternotomy. LVAD device in place. Monitoring leads overlie the patient. Persistent heterogeneous opacities left lung base. Small left pleural effusion. No pneumothorax. IMPRESSION: Similar-appearing cardiomegaly, small left effusion and underlying opacities which may represent atelectasis. Electronically Signed   By: Annia Belt M.D.   On: 12/27/2019 10:17   CT Angio Chest/Abd/Pel for Dissection W and/or Wo Contrast  Result Date: 12/27/2019 CLINICAL DATA:  Severe left upper chest and shoulder pain. Left upper back pain. EXAM: CT ANGIOGRAPHY  CHEST, ABDOMEN AND PELVIS TECHNIQUE: Non-contrast CT of the chest was initially obtained. Multidetector CT imaging through the chest, abdomen and pelvis was performed using the standard protocol during bolus administration of intravenous contrast. Multiplanar reconstructed images and MIPs were obtained and reviewed to evaluate the vascular anatomy. CONTRAST:  OMNIPAQUE IOHEXOL 350 MG/ML SOLN COMPARISON:  11/14/2019 FINDINGS: CTA CHEST FINDINGS Cardiovascular: Portions of the inferior heart and inferior thorax are obscured by beam hardening artifact from left ventricular assist device. The heart is markedly enlarged. No no substantial pericardial effusion. Precontrast imaging shows no hyperattenuating rim associated with the thoracic aorta to suggest acute intramural hematoma. Postcontrast imaging shows no thoracic aortic dissection. There is no thoracic aortic aneurysm. Left ventricular assist device evident. No large central, lobar, or segmental pulmonary embolus. Subsegmental pulmonary arteries are not reliably evaluated on this study. Mediastinum/Nodes: No mediastinal lymphadenopathy. There is no hilar lymphadenopathy. The esophagus has normal imaging features. There is no axillary lymphadenopathy. Lungs/Pleura: Fine detail of lung parenchyma is obscured by breathing motion during image acquisition. Paraseptal emphysema noted with some mild bullous change anteriorly in both lungs. Right lower lobe irregular subpleural opacity measuring 2.9 x 0.7  cm today is similar to prior study and may be scarring. There is some minimal atelectasis at the left base with stable small left pleural effusion. Tiny right pleural effusion seen previously has resolved in the interval. Musculoskeletal: No worrisome lytic or sclerotic osseous abnormality. Similar appearance of the subcutaneous fluid collection superficial to the sternum measuring 13.1 x 9.2 x 3.0 cm today compared to 13.7 x 10.1 x 2.6 cm previously. No fluid  collection over the left pectoralis major today. There is bilateral gynecomastia. Review of the MIP images confirms the above findings. CTA ABDOMEN AND PELVIS FINDINGS VASCULAR Aorta: Normal caliber aorta without aneurysm, dissection, vasculitis or significant stenosis. Celiac: Patent without evidence of aneurysm, dissection, vasculitis or significant stenosis. SMA: Patent without evidence of aneurysm, dissection, vasculitis or significant stenosis. Renals: Both renal arteries are patent without evidence of aneurysm, dissection, vasculitis, fibromuscular dysplasia or significant stenosis. IMA: Patent without evidence of aneurysm, dissection, vasculitis or significant stenosis. Inflow: Atherosclerotic disease but widely patent. Veins: No obvious venous abnormality within the limitations of this arterial phase study. Review of the MIP images confirms the above findings. NON-VASCULAR Hepatobiliary: Portions of the liver are obscured by beam hardening artifact. There is no evidence for gallstones, gallbladder wall thickening, or pericholecystic fluid. No intrahepatic or extrahepatic biliary dilation. Pancreas: Fatty replaced without main duct dilatation. Spleen: No splenomegaly. No focal mass lesion. Adrenals/Urinary Tract: Bilateral adrenal thickening without a discrete mass lesion evident. No evidence for hydronephrosis in either kidney. Stable small cyst upper pole left kidney. No evidence for hydroureter. Bladder is decompressed. Stomach/Bowel: Proximal stomach is obscured. Distal stomach unremarkable. Duodenum is normally positioned as is the ligament of Treitz. No small bowel wall thickening. No small bowel dilatation. The terminal ileum is normal. The appendix is not visualized, but there is no edema or inflammation in the region of the cecum. No gross colonic mass. No colonic wall thickening. Lymphatic: There is no gastrohepatic or hepatoduodenal ligament lymphadenopathy. No retroperitoneal or mesenteric  lymphadenopathy. No pelvic sidewall lymphadenopathy. Reproductive: The prostate gland and seminal vesicles are unremarkable. Other: Small volume free fluid seen in the anterior pelvis bilaterally. 2 cm calcified nodule in the central small bowel mesentery is stable. Musculoskeletal: No worrisome lytic or sclerotic osseous abnormality. The linear fluid collection or granulation tissue seen along the drive line previously is stable in the interval. Review of the MIP images confirms the above findings. IMPRESSION: 1. No evidence for thoracoabdominal aortic aneurysm or dissection. No acute intramural hematoma. No findings to explain the patient's history of left-sided chest and shoulder pain. 2. Marked cardiomegaly with left ventricular assist device. 3. Stable small left pleural effusion with interval resolution of the tiny right pleural effusion seen previously. 4. The subcutaneous fluid collection superficial to the sternum is not substantially changed in the interval. 5. Small volume free fluid in the anterior pelvis bilaterally, nonspecific. 6. Aortic Atherosclerosis (ICD10-I70.0) and Emphysema (ICD10-J43.9). Electronically Signed   By: Kennith Center M.D.   On: 12/27/2019 11:01       Assessment/Plan:    1. Lactic Acidosis - initial Lactate 6.8, repeat 8.5  - unclear etiology. Nontoxic appearing. Normotensive. VAD parameters stable. HS troponin low level and flat. AF. WBC ct only 11. CT of C/A/P unremarkable. No sings of recurrent infection, no aortic or intraabdominal process.  - plan to admit to CCU and start Bicarb gtt  - start empiric abx w/ vanc +cefepime after blood cultures drawn - considered salicylate toxicity, but he denies any recent ASA use - ?  Carbon monoxide poisoning  - check beta hydroxybutyric acid level  - check ABG - follow serial lactate levels   2.  Recurrent DL infection with subxiphoid abscess - s/p debridement and pec flap in 4/21 - on keflex for chronic suppression  -  had recurrent superficial subxiphoid abscess. CT with posible fluid collection. Evaluated by TCTS and IR and found to be muscle flap with no draiable fluid collection 6/21  - Site looks ok today. CT of chest unremarkable, AF. WBC 13. - Given lactic acidosis, will empirically treat w/ broad spectrum abx w/ Vanc +cefepime, check blood cultures - Dr. Renaldo Fiddler to evaluate today  - He is not a candidate for pump exchange given severe RV dysfunction and poor self-care  3.Chronic systolic HF - EF 16% s/p HM-3 LVAD on 09/06/17 - Volume status ok today  - takes torsemide 20mg  M/F. Extra as needed - Continues to struggle with RV failure  4. HM3 LVAD 09/06/2017. - VAD interrogated personally. Parameters stable. - LDH 286 - INR goal 2.0-2.5.  - INR 3.0. Hold coumadin - Off ASA with PUD.   5.  Essential HTN - MAPs ok, currently in the 80s  - Will hold home meds today to avoid hypotension w/ lactic acidosis  - resume meds tomorrow if stable   6. OSA - sleep study with very severe OSA (AHI 69/hr) - continues to refuse therapy. We have arranged f/u in sleep clinic several times but he has not been compliant   7. H/o GI bleed  - 08/2018 EGD completed and showed gastritis and nonbleeding duodenal ulcers, colonoscopy with 5 polyps removed. Protonix was increased to BID.  - hgb 9.9 today c/w baseline  - He will stay off aspirin for now.     I reviewed the LVAD parameters from today, and compared the results to the patient's prior recorded data.  No programming changes were made.  The LVAD is functioning within specified parameters.  The patient performs LVAD self-test daily.  LVAD interrogation was negative for any significant power changes, alarms or PI events/speed drops.  LVAD equipment check completed and is in good working order.  Back-up equipment present.   LVAD education done on emergency procedures and precautions and reviewed exit site care.  Length of Stay: 0  Robbie Lis,  PA-C 12/27/2019, 11:52 AM  VAD Team Pager [phone] (7am - 7am) +++VAD ISSUES ONLY+++   Advanced Heart Failure Team Pager [phone] (M-F; 7a - 4p)  Please contact [facility] for night-coverage after hours (4p -7a ) and weekends on [email or web address] for all non- LVAD Issues  Patient seen and examined with the above-signed Advanced Practice Provider and/or Housestaff. I personally reviewed laboratory data, imaging studies and relevant notes. I independently examined the patient and formulated the important aspects of the plan. I have edited the note to reflect any of my changes or salient points. I have personally discussed the plan with the patient and/or family.  49 y/o male with h/o severe HF s/p VAD. Course complicated by chest wound infection s/p pec flap.  Now admitted with chest pain. CT scan of chest reviewed personally by me and Dr. Vickey Sages and no evidence of infection or other acute pathology.   On labs found to have severe anion gap acidosis with hypoglycemia and rising lactate  On exam looks non-toxic but incessantly asking for water General:  NAD.  HEENT: normal  Neck: supple. JVP not elevated.  Carotids 2+ bilat; no bruits. No lymphadenopathy or thryomegaly appreciated. Cor: LVAD hum.  Chest wound well healed. No erythema or fluctance Lungs: Clear. Abdomen: obese soft, nontender, non-distended. No hepatosplenomegaly. No bruits or masses. Good bowel sounds. Driveline site clean. Anchor in place.  Extremities: no cyanosis, clubbing, rash. Warm no edema  Neuro: alert & oriented x 3. No focal deficits. Moves all 4 without problem   He has anion gap lactic acidosis of unclear etiology. No clear evidence of infection. BP and VAD flows stable. Denies salicylate ingestion or other toxins. Concern over euglycemic DKA but would not expect CBGs to be low.  Will start broad spectrum abx. Aggressive hydration with bicarb gtt in D5. Admit to ICU and check ABG, beta hydroxybutyric acid and  carboxyhemoglobin.    CRITICAL CARE Performed by: Arvilla Meres  Total critical care time: 60 minutes  Critical care time was exclusive of separately billable procedures and treating other patients.  Critical care was necessary to treat or prevent imminent or life-threatening deterioration.  Critical care was time spent personally by me (independent of midlevel providers or residents) on the following activities: development of treatment plan with patient and/or surrogate as well as nursing, discussions with consultants, evaluation of patient's response to treatment, examination of patient, obtaining history from patient or surrogate, ordering and performing treatments and interventions, ordering and review of laboratory studies, ordering and review of radiographic studies, pulse oximetry and re-evaluation of patient's condition.   Arvilla Meres, MD  4:33 PM

## 2019-12-27 NOTE — Progress Notes (Addendum)
ANTICOAGULATION CONSULT NOTE - Initial Consult  Pharmacy Consult for warfarin Indication: LVAD  No Known Allergies  Patient Measurements: Height: 6\' 2"  (188 cm) Weight: 98.9 kg (218 lb) IBW/kg (Calculated) : 82.2 Heparin Dosing Weight: 98.9 kg   Vital Signs: Temp: 97.6 F (36.4 C) (08/12 1013) BP: 103/78 (08/12 1115) Pulse Rate: 116 (08/12 1046)  Labs: Recent Labs    12/27/19 1017 12/27/19 1022  HGB 12.6* 9.9*  HCT 37.0* 34.7*  PLT  --  215  LABPROT  --  25.6*  INR  --  2.4*  CREATININE 1.40* 1.68*  TROPONINIHS  --  39*    Estimated Creatinine Clearance: 69.1 mL/min (A) (by C-G formula based on SCr of 1.68 mg/dL (H)).   Medical History: Past Medical History:  Diagnosis Date  . Asthma   . CHF (congestive heart failure) (HCC)    a. 09/2016: EF 20-25% with cath showing normal cors  . GERD (gastroesophageal reflux disease)   . History of hiatal hernia   . LVAD (left ventricular assist device) present (HCC)   . OSA on CPAP 09/06/2018   Severe OSA with AHI 68/hr on CPAP at 12cm H2O    Medications:  Scheduled:  . docusate sodium  100 mg Oral BID  . DULoxetine  60 mg Oral Daily  . gabapentin  300 mg Oral TID  . hydrALAZINE  50 mg Oral TID  . pantoprazole  40 mg Oral BID  . sildenafil  20 mg Oral TID  . spironolactone  25 mg Oral Daily  . torsemide  20 mg Oral Once per day on Mon Thu  . zinc sulfate  220 mg Oral Daily    Assessment: 48 yom presenting with upper wall chest pain - has hx of muscle flap wound infection growing MSSA in past followed by ID. On warfarin for hx LVAD HM3 (LD confirmed with patient on 8/10).  PTA regimen: 2.5 mg daily except 5 mg MWF  INR today came back therapeutic at 2.4. Hgb 9.9, plt 215. Trop 37. LDH 282. No s/sx of bleeding. Started on vanc/cefepime for possible wound infection.   Goal of Therapy:  INR 2-2.5 Monitor platelets by anticoagulation protocol: Yes   Plan:  Hold warfarin tonight pending plans Will discuss need  for heparin gtt pending INR trend Monitor daily INR, CBC, and for s/sx of bleeding   10/10, PharmD, BCCCP Clinical Pharmacist  Phone: [phone] 12/27/2019 1:59 PM  Please check AMION for all [facility] phone numbers After 10:00 PM, call Main Pharmacy [phone]

## 2019-12-27 NOTE — CV Procedure (Signed)
Radial arterial line placement.    Emergent consent assumed. The right wrist was prepped and draped in the routine sterile fashion a single lumen radial arterial catheter was placed in the right radial artery using a modified Seldinger technique and u/s guidance. Good blood flow and wave forms. A dressing was placed.     Arvilla Meres, MD  5:49 PM

## 2019-12-27 NOTE — Consult Note (Signed)
NAME:  Jonathan Wood, MRN:  [id number], DOB:  1970/06/25, LOS: 0 ADMISSION DATE:  12/27/2019, CONSULTATION DATE:  12/27/19 REFERRING MD:  Bensimhon, CHIEF COMPLAINT:  Lactic acidosis   Brief History   Worsening lactic acidosis, unknown etiology. History of LVAD driveline infections.  History of present illness   Jonathan Wood is a 49 y/o gentleman with a history of HFrEF s/p LVAD placement in 2019 who presented today with acute left sided chest pain. He was found to have worsening lactic acidosis throughout the day. He did not take his usual medications this morning; just ibuprofen. He has been eating minimally due to food tasting bad from his antibiotics (Keflex) and a left lower toothache. He has chronic RLQ abdominal pain with recurrent constipation but develops diarrhea after taking Linzess.  No wounds or rashes. No other pain. He has had lip ulcers that have been recurrent recently.   Past Medical History  HFrEF, s/p LVAD with history of MSSA driveline infections OSA on CPAP 12cm H2O GERD, HH Paraseptal emphysema  Significant Hospital Events     Consults:  Advanced heart failure PCCM  Procedures:  A-line 8/12 x 2 CVC 8/12  Significant Diagnostic Tests:  CT C/A/P without acute findings- stable appearance of muscle flap overlying sternum. Normal abdominal CT.  Micro Data:  8/12 blood culture>>  Antimicrobials:  vanc 8/12> Cefepime 8/12>  Interim history/subjective:    Objective   Blood pressure 95/84, pulse (!) 103, temperature (!) 97.3 F (36.3 C), temperature source Axillary, resp. rate (!) 27, height 6\' 2"  (1.88 m), weight 98.9 kg, SpO2 91 %.        Intake/Output Summary (Last 24 hours) at 12/27/2019 1958 Last data filed at 12/27/2019 1900 Gross per 24 hour  Intake 1159.6 ml  Output --  Net 1159.6 ml   Filed Weights   12/27/19 1115  Weight: 98.9 kg    Examination: General: healthy appearing man sitting up in bed in NAD HENT: [place]/AT, eyes anicteric.  Large left posterior cheek oral ulcer. Multiple fillings but no obvious carries. Lungs: CTAB, breathing comfortably on [place] Cardiovascular: mechanical VAD sounds Abdomen: soft, mildly TTP RLQ without guarding Extremities: no significant peripheral edema Neuro: awake and alert, answering questions appropriately, moving all extremities spontaneously Derm: no erythema or warmth of chest wall, no rashes or wounds.  Resolved Hospital Problem list     Assessment & Plan:  Lactic acidosis; no obvious source of sepsis to explain this. 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 -con't to monitor -transfuse for Hb <7  AKI due to acute heart failure -milrinone  Best practice:  Per primary  Labs   CBC: Recent Labs  Lab 12/27/19 1017 12/27/19 1022 12/27/19 1703 12/27/19 1749  WBC  --  11.3*  --   --   HGB 12.6* 9.9* 12.2* 11.6*  HCT 37.0* 34.7* 36.0* 34.0*  MCV  --  86.8  --   --   PLT  --  215  --   --     Basic Metabolic Panel: Recent  Labs  Lab 12/27/19 1017 12/27/19 1022 12/27/19 1703 12/27/19 1749  NA 136 135 133* 135  K 3.8 3.8 4.2 4.1  CL 106 102  --   --   CO2  --  14*  --   --   GLUCOSE 46* 48*  --   --   BUN 12 13  --   --   CREATININE 1.40* 1.68*  --   --   CALCIUM  --  9.4  --   --    GFR: Estimated Creatinine Clearance: 69.1 mL/min (A) (by C-G formula based on SCr of 1.68 mg/dL (H)). Recent Labs  Lab 12/27/19 1022  12/27/19 1211 12/27/19 1740  WBC 11.3*  --   --   LATICACIDVEN 6.8* 8.5* >11.0*    Liver Function Tests: No results for input(s): AST, ALT, ALKPHOS, BILITOT, PROT, ALBUMIN in the last 168 hours. No results for input(s): LIPASE, AMYLASE in the last 168 hours. No results for input(s): AMMONIA in the last 168 hours.  ABG    Component Value Date/Time   PHART 7.190 (LL) 12/27/2019 1749   PCO2ART 21.9 (L) 12/27/2019 1749   PO2ART 105 12/27/2019 1749   HCO3 8.4 (L) 12/27/2019 1749   TCO2 9 (L) 12/27/2019 1749   ACIDBASEDEF 18.0 (H) 12/27/2019 1749   O2SAT 97.0 12/27/2019 1749     Coagulation Profile: Recent Labs  Lab 12/21/19 1225 12/27/19 1022 12/27/19 1347  INR 3.9* 2.4* 3.0*    Cardiac Enzymes: No results for input(s): CKTOTAL, CKMB, CKMBINDEX, TROPONINI in the last 168 hours.  HbA1C: Hgb A1c MFr Bld  Date/Time Value Ref Range Status  08/30/2017 12:37 PM 5.2 4.8 - 5.6 % Final    Comment:    (NOTE) Pre diabetes:          5.7%-6.4% Diabetes:              >6.4% Glycemic control for   <7.0% adults with diabetes     CBG: Recent Labs  Lab 12/27/19 1554 12/27/19 1604 12/27/19 1625 12/27/19 1718  GLUCAP 14* 25* 137* 125*    Review of Systems:    Review of Systems: (bold if positive, otherwise negative)  General: fevers, chills, sweats, poor intake HENT: rhinorrhea, congestion, sore throat Eyes: blurry vision, double vision Cardio: chest pain, palpitations, edema Pulm: wheezing, cough, sputum production, hemoptysis, SOB Abd: heartburn, nausea, vomiting, diarrhea, bloody stools, abdominal pain GU: hematuria, dysuria Derm: rashes, wounds Heme/Lymph: adenopathy, bruising Neuro: syncope, headache, vertigo, numbness, weakness   Past Medical History  He,  has a past medical history of Asthma, CHF (congestive heart failure) (HCC), GERD (gastroesophageal reflux disease), History of hiatal hernia, LVAD (left ventricular assist device) present (HCC), and OSA on  CPAP (09/06/2018).   Surgical History    Past Surgical History:  Procedure Laterality Date  . APPLICATION OF A-CELL OF CHEST/ABDOMEN N/A 08/24/2019   Procedure: Application Of A-Cell Of Chest/Abdomen;  Surgeon: Kerin Perna, MD;  Location: [facility] OR;  Service: Thoracic;  Laterality: N/A;  . APPLICATION OF A-CELL OF CHEST/ABDOMEN N/A 09/05/2019   Procedure: Application Of A-Cell Of Chest/Abdomen;  Surgeon: Peggye Form, DO;  Location: [facility] OR;  Service: Plastics;  Laterality: N/A;  . APPLICATION OF A-CELL OF CHEST/ABDOMEN  09/03/2019   Procedure: Application Of A-Cell Of Chest/Abdomen;  Surgeon: Kerin Perna, MD;  Location: [facility] OR;  Service: Thoracic;;  . APPLICATION OF WOUND VAC N/A 08/22/2019   Procedure: Debridement, Irrigation and Packing of Abdominal Incision.;  Surgeon: Kerin Perna,  MD;  Location: [facility] OR;  Service: Thoracic;  Laterality: N/A;  . APPLICATION OF WOUND VAC N/A 08/24/2019   Procedure: Irrigation and Debridement with WOUND VAC APPLICATION;  Surgeon: Kerin Perna, MD;  Location: [facility] OR;  Service: Thoracic;  Laterality: N/A;  . APPLICATION OF WOUND VAC N/A 08/30/2019   Procedure: APPLICATION OF WOUND VAC;  Surgeon: Kerin Perna, MD;  Location: [facility] OR;  Service: Thoracic;  Laterality: N/A;  . APPLICATION OF WOUND VAC N/A 09/03/2019   Procedure: WOUND VAC CHANGE;  Surgeon: Kerin Perna, MD;  Location: [facility] OR;  Service: Thoracic;  Laterality: N/A;  . BIOPSY  08/23/2018   Procedure: BIOPSY;  Surgeon: Lemar Lofty., MD;  Location: [facility] ENDOSCOPY;  Service: Gastroenterology;;  . COLONOSCOPY N/A 08/23/2018   Procedure: COLONOSCOPY;  Surgeon: Lemar Lofty., MD;  Location: [facility] ENDOSCOPY;  Service: Gastroenterology;  Laterality: N/A;  . ENTEROSCOPY N/A 08/23/2018   Procedure: ENTEROSCOPY;  Surgeon: Meridee Score Netty Starring., MD;  Location: [facility] ENDOSCOPY;  Service: Gastroenterology;  Laterality: N/A;  . HEMOSTASIS CLIP PLACEMENT  08/23/2018   Procedure: HEMOSTASIS CLIP  PLACEMENT;  Surgeon: Lemar Lofty., MD;  Location: [facility] ENDOSCOPY;  Service: Gastroenterology;;  . IABP INSERTION N/A 09/05/2017   Procedure: IABP INSERTION;  Surgeon: Dolores Patty, MD;  Location: [facility] INVASIVE CV LAB;  Service: Cardiovascular;  Laterality: N/A;  . INSERTION OF IMPLANTABLE LEFT VENTRICULAR ASSIST DEVICE N/A 09/06/2017   Procedure: INSERTION OF IMPLANTABLE LEFT VENTRICULAR ASSIST DEVICE - HM3;  Surgeon: Kerin Perna, MD;  Location: [facility] OR;  Service: Open Heart Surgery;  Laterality: N/A;  HM3  . MUSCLE FLAP CLOSURE N/A 09/05/2019   Procedure: MUSCLE FLAP CLOSURE;  Surgeon: Peggye Form, DO;  Location: [facility] OR;  Service: Plastics;  Laterality: N/A;  . NASAL FRACTURE SURGERY  1987  . POLYPECTOMY  08/23/2018   Procedure: POLYPECTOMY;  Surgeon: Mansouraty, Netty Starring., MD;  Location: [facility] ENDOSCOPY;  Service: Gastroenterology;;  . RIGHT HEART CATH N/A 08/30/2017   Procedure: RIGHT HEART CATH;  Surgeon: Dolores Patty, MD;  Location: [facility] INVASIVE CV LAB;  Service: Cardiovascular;  Laterality: N/A;  . RIGHT/LEFT HEART CATH AND CORONARY ANGIOGRAPHY N/A 10/08/2016   Procedure: Right/Left Heart Cath and Coronary Angiography;  Surgeon: Orpah Cobb, MD;  Location: [facility] INVASIVE CV LAB;  Service: Cardiovascular;  Laterality: N/A;  . STERNAL WOUND DEBRIDEMENT N/A 08/20/2019   Procedure: DEBRIDEMENT OF LVAD DRIVELINE TUNNEL;  Surgeon: Kerin Perna, MD;  Location: [facility] OR;  Service: Thoracic;  Laterality: N/A;  . STERNAL WOUND DEBRIDEMENT N/A 09/05/2019   Procedure: DEBRIDEMENT AND CLOSURE OF ABDOMINAL WOUND;  Surgeon: Peggye Form, DO;  Location: [facility] OR;  Service: Plastics;  Laterality: N/A;  . SUBMUCOSAL TATTOO INJECTION  08/23/2018   Procedure: SUBMUCOSAL TATTOO INJECTION;  Surgeon: Lemar Lofty., MD;  Location: [facility] ENDOSCOPY;  Service: Gastroenterology;;  . TEE WITHOUT CARDIOVERSION N/A 09/06/2017   Procedure: TRANSESOPHAGEAL ECHOCARDIOGRAM (TEE);  Surgeon: Donata Clay, Theron Arista, MD;  Location: [facility] OR;  Service: Open Heart Surgery;  Laterality: N/A;  . WOUND DEBRIDEMENT N/A 08/30/2019   Procedure: Debridement Abdominal Wound;  Surgeon: Kerin Perna, MD;  Location: [facility] OR;  Service: Thoracic;  Laterality: N/A;     Social History   reports that he has quit smoking. His smoking use included cigarettes. He has a 12.50 pack-year smoking history. He has never used smokeless tobacco. He reports previous alcohol use. He reports previous drug use.   Family History   His  family history includes Hypertension in his father. There is no history of Colon cancer, Esophageal cancer, Inflammatory bowel disease, Liver disease, Pancreatic cancer, Rectal cancer, Stomach cancer, or Diabetes.   Allergies No Known Allergies   Home Medications  Prior to Admission medications   Medication Sig Start Date End Date Taking? Authorizing Provider  albuterol (PROAIR HFA) 108 (90 Base) MCG/ACT inhaler Inhale 2 puffs into the lungs every 6 (six) hours as needed for wheezing or shortness of breath. 11/13/19  Yes Bensimhon, Bevelyn Buckles, MD  cephALEXin (KEFLEX) 500 MG capsule Take 1 capsule (500 mg total) by mouth 3 (three) times daily. 10/15/19  Yes Cliffton Asters, MD  docusate sodium (STOOL SOFTENER) 100 MG capsule Take 1 capsule (100 mg total) by mouth 2 (two) times daily. Patient taking differently: Take 100 mg by mouth daily as needed for mild constipation.  12/06/19  Yes Bensimhon, Bevelyn Buckles, MD  gabapentin (NEURONTIN) 300 MG capsule TAKE ONE CAPSULE BY MOUTH THREE TIMES DAILY Patient taking differently: Take 300 mg by mouth 3 (three) times daily.  06/07/19  Yes Bensimhon, Bevelyn Buckles, MD  hydrALAZINE (APRESOLINE) 50 MG tablet Take 1 tablet (50 mg total) by mouth 3 (three) times daily. Patient taking differently: Take 50 mg by mouth daily.  05/01/19  Yes Robbie Lis M, PA-C  hydrOXYzine (ATARAX/VISTARIL) 25 MG tablet Take 1 tablet (25 mg total) by mouth at bedtime as needed for anxiety.  09/13/19  Yes Clegg, Amy D, NP  linaclotide (LINZESS) 145 MCG CAPS capsule Take 1 capsule (145 mcg total) by mouth daily before breakfast. 06/11/19  Yes Mansouraty, Netty Starring., MD  potassium chloride SA (KLOR-CON) 20 MEQ tablet Take 20 mEq by mouth daily. 12/12/19  Yes [provider]  sildenafil (REVATIO) 20 MG tablet Take 1 tablet (20 mg total) by mouth 3 (three) times daily. 11/23/19  Yes Bensimhon, Bevelyn Buckles, MD  spironolactone (ALDACTONE) 25 MG tablet Take 1 tablet (25 mg total) by mouth daily. 09/14/19  Yes Clegg, Amy D, NP  torsemide (DEMADEX) 20 MG tablet Take 1 tablet (20 mg total) by mouth 2 (two) times a week. Take 1 tablet (20mg ) on Monday and Friday. 11/26/19  Yes Bensimhon, Bevelyn Buckles, MD  warfarin (COUMADIN) 5 MG tablet Take 5 mg (1 tab) every Monday, Wed, and Friday and 2.5 mg (0.5 tab) all other days or as directed by HF Clinic. Patient taking differently: Take 5 mg by mouth See admin instructions. Take 5 mg (1 tab) every Monday, Wed, and Friday and 2.5 mg (0.5 tab) all other days or as directed by HF Clinic. 11/30/19  Yes Evon Slack, RPH-CPP  DULoxetine (CYMBALTA) 60 MG capsule Take 1 capsule (60 mg total) by mouth daily. Patient not taking: Reported on 12/27/2019 10/16/19   Bensimhon, Bevelyn Buckles, MD  oxyCODONE 10 MG TABS Take 1 tablet (10 mg total) by mouth every 8 (eight) hours. Patient not taking: Reported on 11/23/2019 09/13/19   Tonye Becket D, NP  pantoprazole (PROTONIX) 40 MG tablet Take 1 tablet (40 mg total) by mouth 2 (two) times daily. Patient not taking: Reported on 12/27/2019 10/11/19   Bensimhon, Bevelyn Buckles, MD  senna-docusate (SENOKOT-S) 8.6-50 MG tablet Take 1 tablet by mouth 2 (two) times daily. Patient not taking: Reported on 11/23/2019 09/13/19   Tonye Becket D, NP  traMADol (ULTRAM) 50 MG tablet Take 2 tablets (100 mg total) by mouth every 6 (six) hours as needed for moderate pain. Patient not taking: Reported on 11/23/2019 09/13/19   Filbert Schilder,  Amy D, NP  zinc sulfate 220 (50 Zn) MG  capsule Take 1 capsule (220 mg total) by mouth daily. Patient not taking: Reported on 12/27/2019 09/14/19   Tonye Becket D, NP     Critical care time: 37 min.     Steffanie Dunn, DO 12/28/19 2:25 AM [facility]

## 2019-12-27 NOTE — Procedures (Signed)
Arterial Catheter Insertion Procedure Note  JARMARCUS WAMBOLD  [id number]  20-May-1970  Date:12/27/19  Time:8:39 PM    Provider Performing: Tobey Grim    Procedure: Insertion of Arterial Line (48592) with US guidance (76394)   Indication(s) Blood pressure monitoring and/or need for frequent ABGs  Consent Risks of the procedure as well as the alternatives and risks of each were explained to the patient and/or caregiver.  Consent for the procedure was obtained and is signed in the bedside chart  Anesthesia None   Time Out Verified patient identification, verified procedure, site/side was marked, verified correct patient position, special equipment/implants available, medications/allergies/relevant history reviewed, required imaging and test results available.   Sterile Technique Maximal sterile technique including full sterile barrier drape, hand hygiene, sterile gown, sterile gloves, mask, hair covering, sterile ultrasound probe cover (if used).   Procedure Description Area of catheter insertion was cleaned with chlorhexidine and draped in sterile fashion. With real-time ultrasound guidance an arterial catheter was placed into the left radial artery.  Appropriate arterial tracings confirmed on monitor.     Complications/Tolerance None; patient tolerated the procedure well.   EBL Minimal   Specimen(s) None

## 2019-12-28 ENCOUNTER — Other Ambulatory Visit (HOSPITAL_COMMUNITY): Payer: Medicaid Other

## 2019-12-28 ENCOUNTER — Encounter (HOSPITAL_COMMUNITY): Payer: Self-pay | Admitting: Internal Medicine

## 2019-12-28 ENCOUNTER — Inpatient Hospital Stay (HOSPITAL_COMMUNITY): Payer: Medicare Other

## 2019-12-28 ENCOUNTER — Other Ambulatory Visit: Payer: Self-pay

## 2019-12-28 ENCOUNTER — Ambulatory Visit: Payer: Medicaid Other | Admitting: Nurse Practitioner

## 2019-12-28 DIAGNOSIS — Z95811 Presence of heart assist device: Secondary | ICD-10-CM | POA: Diagnosis not present

## 2019-12-28 DIAGNOSIS — I5022 Chronic systolic (congestive) heart failure: Secondary | ICD-10-CM | POA: Diagnosis not present

## 2019-12-28 DIAGNOSIS — E872 Acidosis, unspecified: Secondary | ICD-10-CM

## 2019-12-28 DIAGNOSIS — E162 Hypoglycemia, unspecified: Secondary | ICD-10-CM

## 2019-12-28 DIAGNOSIS — R570 Cardiogenic shock: Secondary | ICD-10-CM | POA: Diagnosis not present

## 2019-12-28 DIAGNOSIS — K0889 Other specified disorders of teeth and supporting structures: Secondary | ICD-10-CM

## 2019-12-28 LAB — BASIC METABOLIC PANEL
Anion gap: 13 (ref 5–15)
BUN: 22 mg/dL — ABNORMAL HIGH (ref 6–20)
CO2: 24 mmol/L (ref 22–32)
Calcium: 8.1 mg/dL — ABNORMAL LOW (ref 8.9–10.3)
Chloride: 92 mmol/L — ABNORMAL LOW (ref 98–111)
Creatinine, Ser: 1.95 mg/dL — ABNORMAL HIGH (ref 0.61–1.24)
GFR calc Af Amer: 46 mL/min — ABNORMAL LOW (ref >60)
GFR calc non Af Amer: 40 mL/min — ABNORMAL LOW (ref >60)
Glucose, Bld: 225 mg/dL — ABNORMAL HIGH (ref 70–99)
Potassium: 3.4 mmol/L — ABNORMAL LOW (ref 3.5–5.1)
Sodium: 129 mmol/L — ABNORMAL LOW (ref 135–145)

## 2019-12-28 LAB — CBC
HCT: 24.1 % — ABNORMAL LOW (ref 39.0–52.0)
Hemoglobin: 7.4 g/dL — ABNORMAL LOW (ref 13.0–17.0)
MCH: 24.9 pg — ABNORMAL LOW (ref 26.0–34.0)
MCHC: 30.7 g/dL (ref 30.0–36.0)
MCV: 81.1 fL (ref 80.0–100.0)
Platelets: 161 10*3/uL (ref 150–400)
RBC: 2.97 MIL/uL — ABNORMAL LOW (ref 4.22–5.81)
RDW: 19.1 % — ABNORMAL HIGH (ref 11.5–15.5)
WBC: 13.7 10*3/uL — ABNORMAL HIGH (ref 4.0–10.5)
nRBC: 0 % (ref 0.0–0.2)

## 2019-12-28 LAB — ECHOCARDIOGRAM LIMITED
Height: 74 in
Weight: 3742.53 oz

## 2019-12-28 LAB — PROTIME-INR
INR: 3 — ABNORMAL HIGH (ref 0.8–1.2)
Prothrombin Time: 30.2 seconds — ABNORMAL HIGH (ref 11.4–15.2)

## 2019-12-28 LAB — COOXEMETRY PANEL
Carboxyhemoglobin: 1.5 % (ref 0.5–1.5)
Carboxyhemoglobin: 1.9 % — ABNORMAL HIGH (ref 0.5–1.5)
Carboxyhemoglobin: 2.2 % — ABNORMAL HIGH (ref 0.5–1.5)
Methemoglobin: 0.8 % (ref 0.0–1.5)
Methemoglobin: 0.8 % (ref 0.0–1.5)
Methemoglobin: 1.1 % (ref 0.0–1.5)
O2 Saturation: 63.3 %
O2 Saturation: 73.9 %
O2 Saturation: 74 %
Total hemoglobin: 7.5 g/dL — ABNORMAL LOW (ref 12.0–16.0)
Total hemoglobin: 7.8 g/dL — ABNORMAL LOW (ref 12.0–16.0)
Total hemoglobin: 8.9 g/dL — ABNORMAL LOW (ref 12.0–16.0)

## 2019-12-28 LAB — GLUCOSE, CAPILLARY
Glucose-Capillary: 108 mg/dL — ABNORMAL HIGH (ref 70–99)
Glucose-Capillary: 134 mg/dL — ABNORMAL HIGH (ref 70–99)
Glucose-Capillary: 14 mg/dL — CL (ref 70–99)
Glucose-Capillary: 141 mg/dL — ABNORMAL HIGH (ref 70–99)
Glucose-Capillary: 190 mg/dL — ABNORMAL HIGH (ref 70–99)
Glucose-Capillary: 208 mg/dL — ABNORMAL HIGH (ref 70–99)
Glucose-Capillary: 229 mg/dL — ABNORMAL HIGH (ref 70–99)
Glucose-Capillary: 260 mg/dL — ABNORMAL HIGH (ref 70–99)
Glucose-Capillary: 311 mg/dL — ABNORMAL HIGH (ref 70–99)

## 2019-12-28 LAB — VOLATILES,BLD-ACETONE,ETHANOL,ISOPROP,METHANOL
Acetone, blood: <0.01 g/dL (ref 0.000–0.010)
Ethanol, blood: <0.01 g/dL (ref 0.000–0.010)
Isopropanol, blood: <0.01 g/dL (ref 0.000–0.010)
Methanol, blood: <0.01 g/dL (ref 0.000–0.010)

## 2019-12-28 LAB — PREPARE RBC (CROSSMATCH)

## 2019-12-28 LAB — ECHOCARDIOGRAM COMPLETE
Area-P 1/2: 5.31 cm2
Calc EF: 11.7 %
Height: 74 in
S' Lateral: 7 cm
Single Plane A2C EF: 10.4 %
Single Plane A4C EF: 11.2 %
Weight: 3742.53 oz

## 2019-12-28 LAB — ETHYLENE GLYCOL: Ethylene Glycol Lvl: <5 mg/dL

## 2019-12-28 LAB — HEMOGLOBIN AND HEMATOCRIT, BLOOD
HCT: 23.7 % — ABNORMAL LOW (ref 39.0–52.0)
HCT: 28.1 % — ABNORMAL LOW (ref 39.0–52.0)
Hemoglobin: 7.1 g/dL — ABNORMAL LOW (ref 13.0–17.0)
Hemoglobin: 8.7 g/dL — ABNORMAL LOW (ref 13.0–17.0)

## 2019-12-28 LAB — LACTATE DEHYDROGENASE: LDH: 252 U/L — ABNORMAL HIGH (ref 98–192)

## 2019-12-28 LAB — LACTIC ACID, PLASMA
Lactic Acid, Venous: 3.8 mmol/L (ref 0.5–1.9)
Lactic Acid, Venous: 2.3 mmol/L (ref 0.5–1.9)

## 2019-12-28 LAB — HEMOGLOBIN A1C
Hgb A1c MFr Bld: 5.4 % (ref 4.8–5.6)
Mean Plasma Glucose: 108.28 mg/dL

## 2019-12-28 MED ORDER — POTASSIUM CHLORIDE CRYS ER 20 MEQ PO TBCR
40.0000 meq | EXTENDED_RELEASE_TABLET | Freq: Once | ORAL | Status: AC
  Administered 2019-12-28: 40 meq via ORAL
  Filled 2019-12-28: qty 2

## 2019-12-28 MED ORDER — INSULIN ASPART 100 UNIT/ML ~~LOC~~ SOLN
0.0000 [IU] | SUBCUTANEOUS | Status: DC
  Administered 2019-12-28: 3 [IU] via SUBCUTANEOUS
  Administered 2019-12-28 (×2): 2 [IU] via SUBCUTANEOUS

## 2019-12-28 MED ORDER — INSULIN ASPART 100 UNIT/ML ~~LOC~~ SOLN: 0.0000 [IU] | SUBCUTANEOUS | Status: DC

## 2019-12-28 MED ORDER — WHITE PETROLATUM EX OINT
TOPICAL_OINTMENT | CUTANEOUS | Status: AC
  Filled 2019-12-28: qty 28.35

## 2019-12-28 MED ORDER — WARFARIN - PHARMACIST DOSING INPATIENT: Freq: Every day | Status: DC

## 2019-12-28 MED ORDER — PROSOURCE PLUS PO LIQD
30.0000 mL | Freq: Two times a day (BID) | ORAL | Status: DC
  Filled 2019-12-28 (×11): qty 30

## 2019-12-28 MED ORDER — SILDENAFIL CITRATE 20 MG PO TABS
20.0000 mg | ORAL_TABLET | Freq: Three times a day (TID) | ORAL | Status: DC
  Administered 2019-12-28 – 2020-01-06 (×29): 20 mg via ORAL
  Filled 2019-12-28 (×29): qty 1

## 2019-12-28 MED ORDER — SODIUM CHLORIDE 0.9 % IV SOLN
INTRAVENOUS | Status: DC | PRN
  Administered 2019-12-28 – 2019-12-30 (×2): 250 mL via INTRAVENOUS

## 2019-12-28 MED ORDER — SODIUM CHLORIDE 0.9 % IV SOLN
3.0000 g | Freq: Four times a day (QID) | INTRAVENOUS | Status: DC
  Administered 2019-12-28 – 2019-12-30 (×8): 3 g via INTRAVENOUS
  Filled 2019-12-28: qty 3
  Filled 2019-12-28: qty 8
  Filled 2019-12-28 (×6): qty 3
  Filled 2019-12-28: qty 8
  Filled 2019-12-28: qty 3

## 2019-12-28 MED ORDER — NOREPINEPHRINE 4 MG/250ML-% IV SOLN
2.0000 ug/min | INTRAVENOUS | Status: DC
  Administered 2019-12-28: 5 ug/min via INTRAVENOUS
  Administered 2020-01-01: 3 ug/min via INTRAVENOUS
  Administered 2020-01-02: 2 ug/min via INTRAVENOUS
  Filled 2019-12-28 (×6): qty 250

## 2019-12-28 MED ORDER — INSULIN ASPART 100 UNIT/ML ~~LOC~~ SOLN
0.0000 [IU] | Freq: Three times a day (TID) | SUBCUTANEOUS | Status: DC
  Administered 2019-12-29 – 2019-12-30 (×5): 1 [IU] via SUBCUTANEOUS
  Administered 2019-12-31: 2 [IU] via SUBCUTANEOUS
  Administered 2019-12-31 – 2020-01-02 (×5): 1 [IU] via SUBCUTANEOUS
  Administered 2020-01-02: 2 [IU] via SUBCUTANEOUS
  Administered 2020-01-02 – 2020-01-05 (×5): 1 [IU] via SUBCUTANEOUS
  Administered 2020-01-06: 2 [IU] via SUBCUTANEOUS

## 2019-12-28 MED ORDER — ADULT MULTIVITAMIN W/MINERALS CH
1.0000 | ORAL_TABLET | Freq: Every day | ORAL | Status: DC
  Administered 2019-12-29 – 2020-01-06 (×9): 1 via ORAL
  Filled 2019-12-28 (×9): qty 1

## 2019-12-28 MED ORDER — FUROSEMIDE 10 MG/ML IJ SOLN
80.0000 mg | Freq: Once | INTRAMUSCULAR | Status: AC
  Administered 2019-12-28: 80 mg via INTRAVENOUS
  Filled 2019-12-28: qty 8

## 2019-12-28 MED ORDER — SODIUM CHLORIDE 0.9% IV SOLUTION: Freq: Once | INTRAVENOUS | Status: AC

## 2019-12-28 NOTE — Progress Notes (Signed)
Speed  Flow  PI  Power  LVIDD  AI  Aortic openings  MR  TR  Septum  RV   5600  5.0 2.2 4.1 6.7  5/5     Bow right severe   5800  5.2 2.2 4.5   5/5   Slight bow right severe  6000  5.3 2.0 4.8 6.7  5/5   Slight bow right severe   6200 5.5 2.0 5.1 6.53  5/5   Midline/ minimal pull left severe                              Ramp ECHO performed at bedside.  At completion of ramp study, patients primary and back up controller programmed:  Fixed speed: 6200 Low speed limit: 5900  Alyce Pagan RN VAD Coordinator  Office: [phone]  24/7 Pager: [phone]

## 2019-12-28 NOTE — Progress Notes (Signed)
LVAD Coordinator Rounding Note:  Admitted 12/28/19 by Dr [name] for severe left upper chest/ shoulder pain, tachycardia, and tachypnea. Lactic acid >11.   STAT CT of C/A/P negative for dissection. The subcutaneous fluid collection superficial to the sternum is not substantially changed from previous study. No acute abdominal process.   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 Laneta Simmers under Destination Therapy criteria.   Pt laying in bed resting. States he feels better than yesterday, but is still very tired.   Coox 51 yesterday- started on Milrinone 0.25 mcg/kg/min. Coox today 74. Hgb 7.4 this morning. Receiving 2 PRBC. No signs/symptoms of bleeding. Lactic 2.8 today. CVP 18. Will give 80 mg IV lasix per Dr [name].   Repeat echo completed this morning. Plan for RAMP echo this afternoon.  Pt's controller not communicating with system monitor. Have exchanged out system monitor and patient cable x 3. Problem thought to be white cable on patient's controller. Exchanged controller to [id number]. Issue resolved with new controller.   Pt did not bring back up equipment. Loaner back up bag provided to pt.   Vital signs: Temp: 97.4 HR: 105 Art BP: 97/70 (80) Doppler: 72 Sat: 99% on 2 L Wt: 233.9> lbs  LVAD interrogation reveals:  Speed: 5600 Flow: 4.9 Power:  4.1w PI: 2.0   Alarms: UTO due to patient cable not communicating with system monitor Events: UTO due to patient cable not communicating with system monitor Hematocrit: 30  Fixed speed: 5600  Low speed limit: 5300  Drive Line:  Left abdominal drive line dressing C/D/I with anchor intact. Weekly dressing changes per BS nurse. Dressing change due 01/03/20.   Labs:  LDH trend: 252  INR trend: 3.0   WBC: 11.3>13.7  Lactic Acid: greater than 11 > 2.3  Infection: - 12/27/19 blood cx>> no growth < 24 hours  Blood Products: -12/28/19> 2 PRBCs  Anticoagulation Plan: -INR Goal: 2.0 - 2.5  -ASA Dose: none  Adverse  Events: - driveline cx 04/23/19>> staph aureus - Admitted for sternal/drive line infection. OR for debridement 08/20/19- + staph aureus wound cultures; further irrigation/debridements done 08/22/19, 08/24/19, 08/30/19, and 09/03/19. Debridement and closure of sternal wound with muscle flap 09/05/19.  - Admitted 11/13/19 for respiratory distress and wound management of subxiphoid abscess. UDS + THC & benzos.  - Admitted 12/27/19 severe left chest/shoulder pain. Profound right heart failure   Plan/Recommendations:  1. Call VAD pager if any VAD equipment or drive line issues. 2. Weekly drive line dressing changes by bedside nurse.   3. Loaner back up equipment at bedside.  Alyce Pagan RN VAD Coordinator  Office: [phone]  24/7 Pager: [phone]

## 2019-12-28 NOTE — Progress Notes (Signed)
Discussed AM labs with [facility] RN Sheria Lang. MD to place KCL replacement orders. Deferred to HF/Cards for Hgb 7.4 since above threshold for CCM to replace.  Pt's VSS, unchanged overnight, no observable bleeding anywhere. Up ~1L from yesterday >> ?dilution. Will draw other AM labs and continue to monitor closely.

## 2019-12-28 NOTE — Progress Notes (Signed)
Initial Nutrition Assessment  DOCUMENTATION CODES:   Not applicable  INTERVENTION:   Downgrade diet to DYS 3 for poor dentition    30 ml ProSource Plus BID, each supplement provides 100 kcals and 15 grams protein.   Magic cup TID with meals, each supplement provides 290 kcal and 9 grams of protein  Double protein portions  MVI daily   NUTRITION DIAGNOSIS:   Increased nutrient needs related to acute illness as evidenced by estimated needs.  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 Presents this admission with lactic acidosis from unknown etiology.   Pt endorses pain on roof of his mouth and R sided molars when eating. States appetite was okay (eating three meals daily with protein) until one week ago when pain increased to intolerable level. During this time he consumed bites of softer foods and liquids. RD observed pt attempting to eat chef salad at bedside. He was unable to finish given pain. Discussed consistency modification we could make to make chewing easier. Pt willing to try. He does not like Ensure but is willing to have ProSource.   Pt reports a UBW of 220 lb and denies wt loss. Records indicate pt weighed 218 lb on 7/9 and 233 lb this admission. Likely fluid fluctuation.   Drips: milrinone Medications: colace, SS novolog, 220 mg zinc sulfate  Labs: Na 129 (L) K 3.4 (L) CBG 182-993  NUTRITION - FOCUSED PHYSICAL EXAM:    Most Recent Value  Orbital Region No depletion  Upper Arm Region No depletion  Thoracic and Lumbar Region Unable to assess  Buccal Region No depletion  Temple Region No depletion  Clavicle Bone Region No depletion  Clavicle and Acromion  Bone Region Unable to assess  Scapular Bone Region No depletion  Dorsal Hand No depletion  Patellar Region No depletion  Anterior Thigh Region No depletion  Posterior Calf Region No depletion  Edema (RD Assessment) Mild  Hair Reviewed  Eyes Reviewed  Mouth Reviewed  Skin Reviewed  Nails Reviewed     Diet Order:   Diet Order            DIET DYS 3 Room service appropriate? Yes; Fluid consistency: Thin  Diet effective now                 EDUCATION NEEDS:   Education needs have been addressed  Skin:  Skin Assessment: Reviewed RN Assessment  Last BM:  8/11  Height:   Ht Readings from Last 1 Encounters:  12/28/19 6\' 2"  (1.88 m)    Weight:   Wt Readings from Last 1 Encounters:  12/28/19 106.1 kg    BMI:  Body mass index is 30.03 kg/m.  Estimated Nutritional Needs:   Kcal:  2300-2500 kcal  Protein:  115-130 grams  Fluid:  >/= 2 L/day   12/30/19 RD, LDN Clinical Nutrition Pager listed in AMION

## 2019-12-28 NOTE — Progress Notes (Signed)
NAME:  Jonathan Wood, MRN:  [id number], DOB:  Jan 19, 1971, LOS: 1 ADMISSION DATE:  12/27/2019, CONSULTATION DATE:  12/27/19 REFERRING MD:  Bensimhon, CHIEF COMPLAINT:  Lactic acidosis   Brief History   Worsening lactic acidosis, unknown etiology. History of LVAD driveline infections.  History of present illness   Jonathan Wood is a 49 y/o gentleman with a history of HFrEF s/p LVAD placement in 2019 who presented today with acute left sided chest pain. He was found to have worsening lactic acidosis throughout the day. He did not take his usual medications this morning; just ibuprofen. He has been eating minimally due to food tasting bad from his antibiotics (Keflex) and a left lower toothache. He has chronic RLQ abdominal pain with recurrent constipation but develops diarrhea after taking Linzess.  No wounds or rashes. No other pain. He has had lip ulcers that have been recurrent recently.   Past Medical History  HFrEF, s/p LVAD with history of MSSA driveline infections OSA on CPAP 12cm H2O GERD, HH Paraseptal emphysema  Significant Hospital Events     Consults:  Advanced heart failure PCCM  Procedures:  A-line 8/12 x 2 RIJ CVC 8/12 >>  Significant Diagnostic Tests:  CT C/A/P without acute findings- stable appearance of muscle flap overlying sternum. Normal abdominal CT.  Micro Data:  8/12 blood culture>>  Antimicrobials:  vanc 8/12> 8/13 Cefepime 8/12>8/13 unasyn 8/13 >>  Interim history/subjective:   Sleepy this morning but wakes up easily On milrinone drip, critically ill  Objective   Blood pressure (!) 89/69, pulse 98, temperature (!) 97.4 F (36.3 C), resp. rate (!) 23, height 6\' 2"  (1.88 m), weight 106.1 kg, SpO2 100 %. CVP:  [16 mmHg-19 mmHg] 19 mmHg      Intake/Output Summary (Last 24 hours) at 12/28/2019 1117 Last data filed at 12/28/2019 1000 Gross per 24 hour  Intake 4616.62 ml  Output 400 ml  Net 4216.62 ml   Filed Weights   12/27/19 1115  12/28/19 0500  Weight: 98.9 kg 106.1 kg    Examination: Gen:      elderly man, no distress  HEENT:  EOMI, sclera anicteric, mild pallor , dental fillings, no neck tenderness Neck:     No JVD; no thyromegaly Lungs:    Basal crackles , rhonchi CV:         LVAD hum Abd:      + bowel sounds; soft, non-tender; no palpable masses, no distension Ext:    No edema; adequate peripheral perfusion Skin:      Warm and dry; no rash Neuro: alert and oriented x 3  SPO2 74% Lactate decreased to 2.3 Labs show mild hyponatremia, slight increase in creatinine, mild leukocytosis   Resolved Hospital Problem list     Assessment & Plan:   In hindsight lactate seems to have improved by adding milrinone consistent with cardiogenic shock   Lactic acidosis-resolving  Cardiogenic shock -milrinone 0.49mcg per cardiology , monitor SPO2 -CT maxillofacial to evaluate for occult odontogenic source of infection -Antibiotics changed to Unasyn per ID -Await final cultures  Acute on chronic HFrEF, LVAD in place -holding warfarin given supratherapeutic INR -management per primary   Chronic anemia, 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    Basic Metabolic Panel: Recent Labs  Lab 12/27/19 1017 12/27/19 1017 12/27/19 1022 12/27/19 1022 12/27/19 1703 12/27/19 1749 12/27/19 2004 12/27/19 2043 12/28/19 0339  NA 136   < > 135   < > 133* 135 132* 131* 129*  K 3.8   < > 3.8   < > 4.2 4.1  4.3 4.2 3.4*  CL 106  --  102  --   --   --   --   --  92*  CO2  --   --  14*  --   --   --   --   --  24  GLUCOSE 46*  --  48*  --   --   --   --   --  225*  BUN 12  --  13  --   --   --   --   --  22*  CREATININE 1.40*  --  1.68*  --   --   --   --   --  1.95*  CALCIUM  --   --  9.4  --   --   --   --   --  8.1*   < > = values in this interval not displayed.   GFR: Estimated Creatinine Clearance: 61.5 mL/min (A) (by C-G formula based on SCr of 1.95 mg/dL (H)). Recent Labs  Lab 12/27/19 1022 12/27/19 1211 12/27/19 1740 12/27/19 2138 12/28/19 0339 12/28/19 0601  WBC 11.3*  --   --   --  13.7*  --   LATICACIDVEN 6.8*   < > >11.0* >11.0* 3.8* 2.3*   < > = values in this interval not displayed.    Cyril Mourning MD. Tonny Bollman. [facility]  If no response to pager , please call [phone]   12/28/2019

## 2019-12-28 NOTE — Progress Notes (Signed)
°  Echocardiogram 2D Echocardiogram has been performed.  Jonathan Wood 12/28/2019, 3:05 PM

## 2019-12-28 NOTE — Plan of Care (Signed)
Plan of Care Summary:  Blood and urine cx sent, pending. UDS +opioids (received IV morphine in ED), negative for other drugs. Salicylate neg. Ethylene glycol and volatiles pending.  MAP 70s-80s on art line. RIJ 3L CVC placed by CCM NP to start milrinone and run coox panel. ABGs improved w/bicarb gtt. Hypoglycemia corrected, BGs no 311>> [facility] notified (current gtts are in D5).   Currently on CPAP w/3L O2 for chronic OSA (usually wears CPAP at home but says strap on mask broke a few days ago so hasn't been wearing it). Visibly SOB, says the dyspnea started "when all this started happening". Lungs sound rhonchus, diminished bases. Pt has a dry cough.  C/o back pain -- given PRN tylenol since LFTs WDL.   LVAD numbers WNL. No alarms.  Of note, pt said he hasn't been eating well past few days d/t tooth pain, so will get a CT in the AM to r/o abscess as potential source of infection. Already receiving Vanc and Cefepime. LA still >11.  Currently sleeping, looks more comfortable now. Will continue to monitor closely.  Problem: Education: Goal: Knowledge of General Education information will improve Description: Including pain rating scale, medication(s)/side effects and non-pharmacologic comfort measures Outcome: Progressing   Problem: Health Behavior/Discharge Planning: Goal: Ability to manage health-related needs will improve Outcome: Progressing   Problem: Clinical Measurements: Goal: Ability to maintain clinical measurements within normal limits will improve Outcome: Progressing Goal: Will remain free from infection Outcome: Progressing Goal: Diagnostic test results will improve Outcome: Progressing Goal: Respiratory complications will improve Outcome: Progressing Goal: Cardiovascular complication will be avoided Outcome: Progressing   Problem: Activity: Goal: Risk for activity intolerance will decrease Outcome: Progressing   Problem: Nutrition: Goal: Adequate nutrition will be  maintained Outcome: Progressing   Problem: Coping: Goal: Level of anxiety will decrease Outcome: Progressing   Problem: Elimination: Goal: Will not experience complications related to bowel motility Outcome: Progressing Goal: Will not experience complications related to urinary retention Outcome: Progressing   Problem: Pain Managment: Goal: General experience of comfort will improve Outcome: Progressing   Problem: Safety: Goal: Ability to remain free from injury will improve Outcome: Progressing   Problem: Skin Integrity: Goal: Risk for impaired skin integrity will decrease Outcome: Progressing

## 2019-12-28 NOTE — Progress Notes (Signed)
ANTICOAGULATION CONSULT NOTE - Follow Up Consult  Pharmacy Consult for warfarin Indication: LVAD  No Known Allergies  Patient Measurements: Height: 6\' 2"  (188 cm) Weight: 106.1 kg (233 lb 14.5 oz) IBW/kg (Calculated) : 82.2 Heparin Dosing Weight: 98.9 kg   Vital Signs: Temp: 97.4 F (36.3 C) (08/13 0738) Temp Source: Oral (08/13 0800) BP: 78/54 (08/13 0800) Pulse Rate: 96 (08/13 0900)  Labs: Recent Labs    12/27/19 1017 12/27/19 1017 12/27/19 1022 12/27/19 1204 12/27/19 1347 12/27/19 1703 12/27/19 2004 12/27/19 2004 12/27/19 2043 12/28/19 0339  HGB 12.6*   < > 9.9*  --   --    < > 11.2*   < > 10.9* 7.4*  HCT 37.0*   < > 34.7*  --   --    < > 33.0*  --  32.0* 24.1*  PLT  --   --  215  --   --   --   --   --   --  161  LABPROT  --   --  25.6*  --  30.0*  --   --   --   --  30.2*  INR  --   --  2.4*  --  3.0*  --   --   --   --  3.0*  CREATININE 1.40*  --  1.68*  --   --   --   --   --   --  1.95*  TROPONINIHS  --   --  39* 37*  --   --   --   --   --   --    < > = values in this interval not displayed.    Estimated Creatinine Clearance: 61.5 mL/min (A) (by C-G formula based on SCr of 1.95 mg/dL (H)).   Medical History: Past Medical History:  Diagnosis Date  . Asthma   . CHF (congestive heart failure) (HCC)    a. 09/2016: EF 20-25% with cath showing normal cors  . GERD (gastroesophageal reflux disease)   . History of hiatal hernia   . LVAD (left ventricular assist device) present (HCC)   . OSA on CPAP 09/06/2018   Severe OSA with AHI 68/hr on CPAP at 12cm H2O    Medications:  Scheduled:  . Chlorhexidine Gluconate Cloth  6 each Topical Daily  . docusate sodium  100 mg Oral BID  . DULoxetine  60 mg Oral Daily  . gabapentin  300 mg Oral TID  . insulin aspart  0-6 Units Subcutaneous Q4H  . pantoprazole  40 mg Oral BID  . sildenafil  20 mg Oral TID  . sodium chloride flush  10-40 mL Intracatheter Q12H  . zinc sulfate  220 mg Oral Daily    Assessment: 48  yom presenting with upper wall chest pain - has hx of muscle flap wound infection growing MSSA in past followed by ID. On warfarin for hx LVAD HM3 (LD confirmed with patient on 8/10).  PTA regimen: 2.5 mg daily except 5 mg MWF  INR is elevated today at 3. Hgb down 7.4, repeat pending, plt 161. LDH 252. No overt bleeding.   Goal of Therapy:  INR 2-2.5 Monitor platelets by anticoagulation protocol: Yes   Plan:  Hold warfarin again tonight Will discuss need for heparin gtt pending INR trend Monitor daily INR, CBC, and for s/sx of bleeding   10/10, PharmD, BCPS Phone [phone] 12/28/2019       9:37 AM  Please check [email or web address] for unit-specific  pharmacist phone numbers

## 2019-12-28 NOTE — Progress Notes (Signed)
eLink Physician-Brief Progress Note Patient Name: Jonathan Wood DOB: 25-Jan-1971 MRN: [id number]   Date of Service  12/28/2019  HPI/Events of Note  K+ 3.4  eICU Interventions  KCL 40 meq  Orally x 1.        [name] Lot Besnik Febus 12/28/2019, 5:23 AM

## 2019-12-28 NOTE — Progress Notes (Addendum)
Advanced Heart Failure VAD Team Note  PCP-Cardiologist: Arvilla Meres, MD   Subjective:    Lactic acid increased to >11 yesterday.   CVC placed overnight, initial Co-ox low at 51%. Started on 0.25 of milrinone.   Co-ox up to 74% today. Lactic acid down to 2.3.   CVP ~18  NSVT on milrinone, longest run ~16 beats. Currently NSR.   Hgb down 9.9>>7.4. Repeat CBC pending   He is feeling better today. Just tired/ sleepy.    LVAD INTERROGATION:  HeartMate III LVAD:   Flow 4.7 liters/min, speed 5600, power 4.2, PI 2.0  Objective:    Vital Signs:   Temp:  [97.2 F (36.2 C)-97.6 F (36.4 C)] 97.4 F (36.3 C) (08/13 0738) Pulse Rate:  [27-133] 100 (08/13 0800) Resp:  [15-42] 18 (08/13 0800) BP: (70-182)/(0-89) 78/54 (08/13 0800) SpO2:  [91 %-100 %] 99 % (08/13 0800) Arterial Line BP: (73-108)/(54-83) 73/55 (08/13 0800) Weight:  [98.9 kg-106.1 kg] 106.1 kg (08/13 0500) Last BM Date: 12/26/19 Mean arterial Pressure 70   Intake/Output:   Intake/Output Summary (Last 24 hours) at 12/28/2019 0902 Last data filed at 12/28/2019 0800 Gross per 24 hour  Intake 4346.83 ml  Output 400 ml  Net 3946.83 ml     Physical Exam    CVP 18  General:  Fatigue appearing. No resp difficulty HEENT: normal Neck: supple. JVP elevated to ear . Carotids 2+ bilat; no bruits. No lymphadenopathy or thyromegaly appreciated. + Rt IJ CVC  Cor: Mechanical heart sounds with LVAD hum present. Lungs: clear Abdomen: distended,  nontender. No hepatosplenomegaly. No bruits or masses. Good bowel sounds. Driveline: C/D/I; securement device intact and driveline incorporated Extremities: no cyanosis, clubbing, rash, edema Neuro: alert & orientedx3, cranial nerves grossly intact. moves all 4 extremities w/o difficulty. Affect pleasant   Telemetry   NSR/ sinus tach low 100s, NSVT on tele   EKG    No new EKG to review   Labs   Basic Metabolic Panel: Recent Labs  Lab 12/27/19 1017 12/27/19 1017  12/27/19 1022 12/27/19 1022 12/27/19 1703 12/27/19 1749 12/27/19 2004 12/27/19 2043 12/28/19 0339  NA 136   < > 135   < > 133* 135 132* 131* 129*  K 3.8   < > 3.8   < > 4.2 4.1 4.3 4.2 3.4*  CL 106  --  102  --   --   --   --   --  92*  CO2  --   --  14*  --   --   --   --   --  24  GLUCOSE 46*  --  48*  --   --   --   --   --  225*  BUN 12  --  13  --   --   --   --   --  22*  CREATININE 1.40*  --  1.68*  --   --   --   --   --  1.95*  CALCIUM  --   --  9.4  --   --   --   --   --  8.1*   < > = values in this interval not displayed.    Liver Function Tests: Recent Labs  Lab 12/27/19 1738  AST 34  ALT 16  ALKPHOS 115  BILITOT 4.2*  PROT 7.9  ALBUMIN 3.1*   No results for input(s): LIPASE, AMYLASE in the last 168 hours. No results for input(s): AMMONIA in the  last 168 hours.  CBC: Recent Labs  Lab 12/27/19 1022 12/27/19 1022 12/27/19 1703 12/27/19 1749 12/27/19 2004 12/27/19 2043 12/28/19 0339  WBC 11.3*  --   --   --   --   --  13.7*  HGB 9.9*   < > 12.2* 11.6* 11.2* 10.9* 7.4*  HCT 34.7*   < > 36.0* 34.0* 33.0* 32.0* 24.1*  MCV 86.8  --   --   --   --   --  81.1  PLT 215  --   --   --   --   --  161   < > = values in this interval not displayed.    INR: Recent Labs  Lab 12/21/19 1225 12/27/19 1022 12/27/19 1347 12/28/19 0339  INR 3.9* 2.4* 3.0* 3.0*    Other results:  EKG:    Imaging   DG CHEST PORT 1 VIEW  Result Date: 12/27/2019 CLINICAL DATA:  Central line placement EXAM: PORTABLE CHEST 1 VIEW COMPARISON:  12/27/2019 FINDINGS: Right central line is in place with the tip at the cavoatrial junction. No pneumothorax. Cardiomegaly. LVAD unchanged. Prior median sternotomy. Scarring in the lingula. No overt edema. Small left pleural effusion. IMPRESSION: Right central line tip at the cavoatrial junction.  No pneumothorax. Cardiomegaly.  Small left effusion. Electronically Signed   By: Charlett Nose M.D.   On: 12/27/2019 21:58   DG Chest Port 1  View  Result Date: 12/27/2019 CLINICAL DATA:  Chest pain shortness of breath. EXAM: PORTABLE CHEST 1 VIEW COMPARISON:  Chest radiograph 11/13/2019. FINDINGS: Stable cardiomegaly status post median sternotomy. LVAD device in place. Monitoring leads overlie the patient. Persistent heterogeneous opacities left lung base. Small left pleural effusion. No pneumothorax. IMPRESSION: Similar-appearing cardiomegaly, small left effusion and underlying opacities which may represent atelectasis. Electronically Signed   By: Annia Belt M.D.   On: 12/27/2019 10:17   CT Angio Chest/Abd/Pel for Dissection W and/or Wo Contrast  Result Date: 12/27/2019 CLINICAL DATA:  Severe left upper chest and shoulder pain. Left upper back pain. EXAM: CT ANGIOGRAPHY CHEST, ABDOMEN AND PELVIS TECHNIQUE: Non-contrast CT of the chest was initially obtained. Multidetector CT imaging through the chest, abdomen and pelvis was performed using the standard protocol during bolus administration of intravenous contrast. Multiplanar reconstructed images and MIPs were obtained and reviewed to evaluate the vascular anatomy. CONTRAST:  OMNIPAQUE IOHEXOL 350 MG/ML SOLN COMPARISON:  11/14/2019 FINDINGS: CTA CHEST FINDINGS Cardiovascular: Portions of the inferior heart and inferior thorax are obscured by beam hardening artifact from left ventricular assist device. The heart is markedly enlarged. No no substantial pericardial effusion. Precontrast imaging shows no hyperattenuating rim associated with the thoracic aorta to suggest acute intramural hematoma. Postcontrast imaging shows no thoracic aortic dissection. There is no thoracic aortic aneurysm. Left ventricular assist device evident. No large central, lobar, or segmental pulmonary embolus. Subsegmental pulmonary arteries are not reliably evaluated on this study. Mediastinum/Nodes: No mediastinal lymphadenopathy. There is no hilar lymphadenopathy. The esophagus has normal imaging features. There is  no axillary lymphadenopathy. Lungs/Pleura: Fine detail of lung parenchyma is obscured by breathing motion during image acquisition. Paraseptal emphysema noted with some mild bullous change anteriorly in both lungs. Right lower lobe irregular subpleural opacity measuring 2.9 x 0.7 cm today is similar to prior study and may be scarring. There is some minimal atelectasis at the left base with stable small left pleural effusion. Tiny right pleural effusion seen previously has resolved in the interval. Musculoskeletal: No worrisome lytic or sclerotic osseous  abnormality. Similar appearance of the subcutaneous fluid collection superficial to the sternum measuring 13.1 x 9.2 x 3.0 cm today compared to 13.7 x 10.1 x 2.6 cm previously. No fluid collection over the left pectoralis major today. There is bilateral gynecomastia. Review of the MIP images confirms the above findings. CTA ABDOMEN AND PELVIS FINDINGS VASCULAR Aorta: Normal caliber aorta without aneurysm, dissection, vasculitis or significant stenosis. Celiac: Patent without evidence of aneurysm, dissection, vasculitis or significant stenosis. SMA: Patent without evidence of aneurysm, dissection, vasculitis or significant stenosis. Renals: Both renal arteries are patent without evidence of aneurysm, dissection, vasculitis, fibromuscular dysplasia or significant stenosis. IMA: Patent without evidence of aneurysm, dissection, vasculitis or significant stenosis. Inflow: Atherosclerotic disease but widely patent. Veins: No obvious venous abnormality within the limitations of this arterial phase study. Review of the MIP images confirms the above findings. NON-VASCULAR Hepatobiliary: Portions of the liver are obscured by beam hardening artifact. There is no evidence for gallstones, gallbladder wall thickening, or pericholecystic fluid. No intrahepatic or extrahepatic biliary dilation. Pancreas: Fatty replaced without main duct dilatation. Spleen: No splenomegaly. No focal  mass lesion. Adrenals/Urinary Tract: Bilateral adrenal thickening without a discrete mass lesion evident. No evidence for hydronephrosis in either kidney. Stable small cyst upper pole left kidney. No evidence for hydroureter. Bladder is decompressed. Stomach/Bowel: Proximal stomach is obscured. Distal stomach unremarkable. Duodenum is normally positioned as is the ligament of Treitz. No small bowel wall thickening. No small bowel dilatation. The terminal ileum is normal. The appendix is not visualized, but there is no edema or inflammation in the region of the cecum. No gross colonic mass. No colonic wall thickening. Lymphatic: There is no gastrohepatic or hepatoduodenal ligament lymphadenopathy. No retroperitoneal or mesenteric lymphadenopathy. No pelvic sidewall lymphadenopathy. Reproductive: The prostate gland and seminal vesicles are unremarkable. Other: Small volume free fluid seen in the anterior pelvis bilaterally. 2 cm calcified nodule in the central small bowel mesentery is stable. Musculoskeletal: No worrisome lytic or sclerotic osseous abnormality. The linear fluid collection or granulation tissue seen along the drive line previously is stable in the interval. Review of the MIP images confirms the above findings. IMPRESSION: 1. No evidence for thoracoabdominal aortic aneurysm or dissection. No acute intramural hematoma. No findings to explain the patient's history of left-sided chest and shoulder pain. 2. Marked cardiomegaly with left ventricular assist device. 3. Stable small left pleural effusion with interval resolution of the tiny right pleural effusion seen previously. 4. The subcutaneous fluid collection superficial to the sternum is not substantially changed in the interval. 5. Small volume free fluid in the anterior pelvis bilaterally, nonspecific. 6. Aortic Atherosclerosis (ICD10-I70.0) and Emphysema (ICD10-J43.9). Electronically Signed   By: Kennith Center M.D.   On: 12/27/2019 11:01       Medications:     Scheduled Medications: . Chlorhexidine Gluconate Cloth  6 each Topical Daily  . docusate sodium  100 mg Oral BID  . DULoxetine  60 mg Oral Daily  . gabapentin  300 mg Oral TID  . insulin aspart  0-6 Units Subcutaneous Q4H  . pantoprazole  40 mg Oral BID  . sodium chloride flush  10-40 mL Intracatheter Q12H  . zinc sulfate  220 mg Oral Daily     Infusions: . sodium chloride 10 mL/hr at 12/28/19 0800  . ceFEPime (MAXIPIME) IV Stopped (12/28/19 0548)  . milrinone 0.25 mcg/kg/min (12/28/19 0800)  . sodium bicarbonate 150 mEq in dextrose 5% 1000 mL 150 mL/hr at 12/28/19 0800  . vancomycin Stopped (12/28/19 0506)  PRN Medications:  sodium chloride, acetaminophen, albuterol, hydrOXYzine, sodium chloride flush  Assessment/Plan:    1. Lactic Acidosis - Lactic acid increased > 11 - 2/2 heart failure. Improving w/ milrinone, down to 2.8 today  - CO2 improved, 14>>24. 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  - Co-ox improved to 74% today  - Repeat 2D echo today   - CVP 18, start IV Lasix 80 mg bid  - hold spiro and hydral w/ AKI and soft MAPs - restart sildenafil 20 tid for RV failure    3.  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 Evaluated by TCTS and IR and found to be muscle flap with no draiable fluid collection 6/21 - Site looks ok today. CT of chest unremarkable, AF. WBC 13.  - can stop IV abx and change back to PO -He is nota candidate for pump exchange given severe RV dysfunction and poor self-care   4. HM3 LVAD 09/06/2017. - VAD interrogated personally. Parameters stable. - LDH 252 - INR goal 2.0-2.5.  - INR 3.0. Hold coumadin - Off ASA with  PUD.  5.Essential HTN - MAPs soft - hold hydral and spiro    6. OSA - sleep study with very severe OSA (AHI 69/hr) - continues to refuse therapy. We have arranged f/u in sleep clinic several times but he has not been compliant    7. H/o GI bleed  - 08/2018 EGD completed and showed gastritis and nonbleeding duodenal ulcers, colonoscopy with 5 polyps removed. Protonix was increased to BID.  - hgb down from 9.9>>7.4. repeat stat CBC to confirm - transfuse for hgb <7.0  - He will stay off aspirin for now.   8. AKI - Scr 1.95 (1.4 on admit) - 2/2 HF and volume overload - continue milrinone. IV Lasix for diuresis - hold spiro - follow BMP    I reviewed the LVAD parameters from today, and compared the results to the patient's prior recorded data.  No programming changes were made.  The LVAD is functioning within specified parameters.  The patient performs LVAD self-test daily.  LVAD interrogation was negative for any significant power changes, alarms or PI events/speed drops.  LVAD equipment check completed and is in good working order.  Back-up equipment present.   LVAD education done on emergency procedures and precautions and reviewed exit site care.  Length of Stay: 1  Knute Neu 12/28/2019, 9:02 AM  VAD Team --- VAD ISSUES ONLY--- Pager [phone] (7am - 7am)  Advanced Heart Failure Team  Pager [phone] (M-F; 7a - 4p)  Please contact [facility] for night-coverage after hours (4p -7a ) and weekends on [email or web address]  Agree with above.   Developed profound lactic acidosis yesterday. Improve with milrinone. CVP high. Hgb down. Had some NSVT overnight.    General:  Lying in bed NAD HEENT: normal  Neck: supple. JVP to ear  Carotids 2+ bilat; no bruits. No lymphadenopathy or thryomegaly appreciated. Cor: LVAD hum.  Lungs: Clear. Mildly tachypneis Abdomen: obese soft, nontender, + distended. No hepatosplenomegaly. No bruits or masses. Good bowel sounds.  Driveline site clean. Anchor in place.  Extremities: no cyanosis, clubbing, rash. Warm no edema  Neuro: alert & oriented x 3. No focal deficits. Moves all 4  without problem   Only explanation for lactic acidosis seems to be hypoperfusion from low output/RV failure despite normal VAD flows. I still am puzzled by this physiology but will continue milrinone for now. Get echo. Can stop bicarb. Doubt infection but will continue abx until cx back. Transfuse 1u RBCs. Will need to have VAD controller replaced as it is not communicating with power module.   CRITICAL CARE Performed by: Arvilla Meres  Total critical care time: 45 minutes  Critical care time was exclusive of separately billable procedures and treating other patients.  Critical care was necessary to treat or prevent imminent or life-threatening deterioration.  Critical care was time spent personally by me (independent of midlevel providers or residents) on the following activities: development of treatment plan with patient and/or surrogate as well as nursing, discussions with consultants, evaluation of patient's response to treatment, examination of patient, obtaining history from patient or surrogate, ordering and performing treatments and interventions, ordering and review of laboratory studies, ordering and review of radiographic studies, pulse oximetry and re-evaluation of patient's condition.  Arvilla Meres, MD  11:26 AM

## 2019-12-28 NOTE — Progress Notes (Signed)
  Echocardiogram 2D Echocardiogram has been performed.  Janalyn Harder 12/28/2019, 8:54 AM

## 2019-12-28 NOTE — Progress Notes (Signed)
[facility]  Date of Admission:  12/27/2019      Total days of antibiotics 2  Vanc + Cefepime    ASSESSMENT: YONAEL TULLOCH is a 49 y.o. male admitted for evaluation of lactic acidosis. Found to have profound hypoglycemia and dental pain that has been ongoing x 1 week. No findings for obvious abscess on exam but gingivitis, dental carries present. No pain with palpation of inner mandible around affected tooth or to outer mandible. Given complaint of chest pain at initial presentation somewhat concerned over presentation c/w ludwigs angina - would change antibiotics to include typical odontogenic infections and use unasyn. So far blood cultures are no growth. Chest pain has resolved, no tenderness to deep neck vessels aside from R with CVC in place. Follow up head/neck CT results.   Fortunately with Dr. Renaldo Fiddler review, chest CT appears stable and no acute nidus of infection identified.   Requiring milrinone also for hemodynamic support.   On HCO3 gtt for lactic acidosis. Tox screen negative     PLAN: 1. Would change antibiotics to Unasyn  2. Follow CT results 3. Follow blood cultures     Active Problems:   Infection associated with driveline of left ventricular assist device (LVAD) (HCC)   Lactic acidosis   Hypoglycemia   Pain, dental   . sodium chloride   Intravenous Once  . Chlorhexidine Gluconate Cloth  6 each Topical Daily  . docusate sodium  100 mg Oral BID  . DULoxetine  60 mg Oral Daily  . furosemide  80 mg Intravenous Once  . gabapentin  300 mg Oral TID  . insulin aspart  0-6 Units Subcutaneous Q4H  . pantoprazole  40 mg Oral BID  . sildenafil  20 mg Oral TID  . sodium chloride flush  10-40 mL Intracatheter Q12H  . Warfarin - Pharmacist Dosing Inpatient   Does not apply q1600  . zinc sulfate  220 mg Oral Daily    SUBJECTIVE: Feeling a lot better today. He has had left lower mandible tooth pain for about a week now  that has prohibited him from eating much. No neck pain or jaw pain. Has not noticed any swelling to the face/mandible. Hurts most when he bites down.  Waiting on CT of mandible.  Chest pain has resolved   Review of Systems: Review of Systems  Constitutional: Positive for appetite change. Negative for chills and fever.  HENT: Positive for dental problem. Negative for trouble swallowing.   Eyes: Negative for photophobia.  Respiratory: Negative for cough.   Cardiovascular: Negative for chest pain.  Gastrointestinal: Negative for diarrhea, nausea and vomiting.  Genitourinary: Negative for dysuria.  Musculoskeletal: Negative for back pain.  Skin: Negative for rash.  Neurological: Negative for headaches.      No Known Allergies  OBJECTIVE: Vitals:   12/28/19 0755 12/28/19 0800 12/28/19 0900 12/28/19 1000  BP:  (!) 78/54  (!) 89/69  Pulse: (!) 103 100 96 (!) 102  Resp: (!) 26 18 20  (!) 22  Temp:      TempSrc:  Oral    SpO2: 100% 99% 100% 97%  Weight:      Height:       Body mass index is 30.03 kg/m.  Physical Exam Vitals reviewed.  Constitutional:      Appearance: He is well-developed.     Comments: Sitting upright in bed. Appears comfortable.   HENT:     Mouth/Throat:  Dentition: Normal dentition. No dental abscesses.      Comments: Dental carries noted throughout mouth. Missing teeth in upper jaw. Some erythema to gumline around marked tooth without any drainage or overt abscess.  No pain with palpating the jaw.  No lymphadenopathy of the head or neck appreciated.  No pain overlying left neck vessels  Cardiovascular:     Rate and Rhythm: Normal rate and regular rhythm.     Heart sounds: Normal heart sounds.  Pulmonary:     Effort: Pulmonary effort is normal. No tachypnea.     Breath sounds: Normal breath sounds.  Chest:     Chest wall: Deformity (stable without any dehiscience of incision. ) present.  Abdominal:     General: There is no distension.      Palpations: Abdomen is soft.     Tenderness: There is no abdominal tenderness.  Lymphadenopathy:     Cervical: No cervical adenopathy.  Skin:    General: Skin is warm and dry.     Findings: No rash.  Neurological:     Mental Status: He is alert and oriented to person, place, and time.  Psychiatric:        Judgment: Judgment normal.     Comments: In good spirits today and engaged in care discussion.      Lab Results Lab Results  Component Value Date   WBC 13.7 (H) 12/28/2019   HGB 7.1 (L) 12/28/2019   HCT 23.7 (L) 12/28/2019   MCV 81.1 12/28/2019   PLT 161 12/28/2019    Lab Results  Component Value Date   CREATININE 1.95 (H) 12/28/2019   BUN 22 (H) 12/28/2019   NA 129 (L) 12/28/2019   K 3.4 (L) 12/28/2019   CL 92 (L) 12/28/2019   CO2 24 12/28/2019    Lab Results  Component Value Date   ALT 16 12/27/2019   AST 34 12/27/2019   ALKPHOS 115 12/27/2019   BILITOT 4.2 (H) 12/27/2019     Microbiology: Recent Results (from the past 240 hour(s))  Culture, blood (routine x 2)     Status: None (Preliminary result)   Collection Time: 12/27/19 10:38 AM   Specimen: BLOOD  Result Value Ref Range Status   Specimen Description BLOOD BLOOD RIGHT HAND  Final   Special Requests   Final    BOTTLES DRAWN AEROBIC AND ANAEROBIC Blood Culture adequate volume   Culture   Final    NO GROWTH < 24 HOURS Performed at [facility], [address], [place], [place] [address]    Report Status PENDING  Incomplete  SARS Coronavirus 2 by RT PCR (hospital order, performed in  [facility] hospital lab) Nasopharyngeal Nasopharyngeal Swab     Status: None   Collection Time: 12/27/19 12:52 PM   Specimen: Nasopharyngeal Swab  Result Value Ref Range Status   SARS Coronavirus 2 NEGATIVE NEGATIVE Final    Comment: (NOTE) SARS-CoV-2 target nucleic acids are NOT DETECTED.  The SARS-CoV-2 RNA is generally detectable in upper and lower respiratory specimens during the acute phase of infection. The  lowest concentration of SARS-CoV-2 viral copies this assay can detect is 250 copies / mL. A negative result does not preclude SARS-CoV-2 infection and should not be used as the sole basis for treatment or other patient management decisions.  A negative result may occur with improper specimen collection / handling, submission of specimen other than nasopharyngeal swab, presence of viral mutation(s) within the areas targeted by this assay, and inadequate number of  viral copies (<250 copies / mL). A negative result must be combined with clinical observations, patient history, and epidemiological information.  Fact Sheet for Patients:   BoilerBrush.com.cy  Fact Sheet for Healthcare Providers: https://pope.com/  This test is not yet approved or  cleared by the [place] FDA and has been authorized for detection and/or diagnosis of SARS-CoV-2 by FDA under an Emergency Use Authorization (EUA).  This EUA will remain in effect (meaning this test can be used) for the duration of the COVID-19 declaration under Section 564(b)(1) of the Act, 21 U.S.C. section 360bbb-3(b)(1), unless the authorization is terminated or revoked sooner.  Performed at [facility], [address], [place], [place] [address]   Culture, blood (routine x 2)     Status: None (Preliminary result)   Collection Time: 12/27/19  1:22 PM   Specimen: BLOOD  Result Value Ref Range Status   Specimen Description BLOOD RIGHT ANTECUBITAL  Final   Special Requests   Final    BOTTLES DRAWN AEROBIC ONLY Blood Culture results may not be optimal due to an inadequate volume of blood received in culture bottles   Culture   Final    NO GROWTH < 24 HOURS Performed at [facility], [address], [place], [place] [address]    Report Status PENDING  Incomplete  MRSA PCR Screening     Status: None   Collection Time: 12/27/19  5:40 PM   Specimen: Nasopharyngeal  Result Value  Ref Range Status   MRSA by PCR NEGATIVE NEGATIVE Final    Comment:        The GeneXpert MRSA Assay (FDA approved for NASAL specimens only), is one component of a comprehensive MRSA colonization surveillance program. It is not intended to diagnose MRSA infection nor to guide or monitor treatment for MRSA infections. Performed at [facility], [address] [place], [place] [address]     Rexene Alberts, MSN, NP-C [facility] Cell: [phone] Pager: [phone]  @TODAY @ 11:00 AM

## 2019-12-28 NOTE — Progress Notes (Signed)
eLink Physician-Brief Progress Note Patient Name: Jonathan Wood DOB: 09-Apr-1971 MRN: [id number]   Date of Service  12/28/2019  HPI/Events of Note  Hyperglycemia  eICU Interventions  CBG with SSI Q 4 hours until blood sugar is stable.        [name] Lot [name] 12/28/2019, 1:43 AM

## 2019-12-28 NOTE — Progress Notes (Signed)
Controlled change/switch at bedside with LVAD coordinator at 1130. Pt tolerated well. No adverse reactions. RN will continue to monitor.

## 2019-12-29 DIAGNOSIS — K1379 Other lesions of oral mucosa: Secondary | ICD-10-CM

## 2019-12-29 DIAGNOSIS — E872 Acidosis: Secondary | ICD-10-CM

## 2019-12-29 LAB — BASIC METABOLIC PANEL
Anion gap: 11 (ref 5–15)
BUN: 25 mg/dL — ABNORMAL HIGH (ref 6–20)
CO2: 28 mmol/L (ref 22–32)
Calcium: 8.3 mg/dL — ABNORMAL LOW (ref 8.9–10.3)
Chloride: 93 mmol/L — ABNORMAL LOW (ref 98–111)
Creatinine, Ser: 1.99 mg/dL — ABNORMAL HIGH (ref 0.61–1.24)
GFR calc Af Amer: 45 mL/min — ABNORMAL LOW (ref >60)
GFR calc non Af Amer: 39 mL/min — ABNORMAL LOW (ref >60)
Glucose, Bld: 154 mg/dL — ABNORMAL HIGH (ref 70–99)
Potassium: 3.1 mmol/L — ABNORMAL LOW (ref 3.5–5.1)
Sodium: 132 mmol/L — ABNORMAL LOW (ref 135–145)

## 2019-12-29 LAB — BPAM RBC
Blood Product Expiration Date: 202109142359
Blood Product Expiration Date: 202109142359
ISSUE DATE / TIME: 202108131123
ISSUE DATE / TIME: 202108131415
Unit Type and Rh: 7300
Unit Type and Rh: 7300

## 2019-12-29 LAB — TYPE AND SCREEN
ABO/RH(D): B POS
Antibody Screen: POSITIVE
Donor AG Type: NEGATIVE
Donor AG Type: NEGATIVE
Unit division: 0
Unit division: 0

## 2019-12-29 LAB — PROTIME-INR
INR: 2.3 — ABNORMAL HIGH (ref 0.8–1.2)
Prothrombin Time: 24.5 seconds — ABNORMAL HIGH (ref 11.4–15.2)

## 2019-12-29 LAB — CBC
HCT: 28.5 % — ABNORMAL LOW (ref 39.0–52.0)
Hemoglobin: 9 g/dL — ABNORMAL LOW (ref 13.0–17.0)
MCH: 25.6 pg — ABNORMAL LOW (ref 26.0–34.0)
MCHC: 31.6 g/dL (ref 30.0–36.0)
MCV: 81.2 fL (ref 80.0–100.0)
Platelets: 149 10*3/uL — ABNORMAL LOW (ref 150–400)
RBC: 3.51 MIL/uL — ABNORMAL LOW (ref 4.22–5.81)
RDW: 19 % — ABNORMAL HIGH (ref 11.5–15.5)
WBC: 15.8 10*3/uL — ABNORMAL HIGH (ref 4.0–10.5)
nRBC: 0.1 % (ref 0.0–0.2)

## 2019-12-29 LAB — GLUCOSE, CAPILLARY
Glucose-Capillary: 155 mg/dL — ABNORMAL HIGH (ref 70–99)
Glucose-Capillary: 191 mg/dL — ABNORMAL HIGH (ref 70–99)
Glucose-Capillary: 177 mg/dL — ABNORMAL HIGH (ref 70–99)
Glucose-Capillary: 150 mg/dL — ABNORMAL HIGH (ref 70–99)

## 2019-12-29 LAB — COOXEMETRY PANEL
Carboxyhemoglobin: 2 % — ABNORMAL HIGH (ref 0.5–1.5)
Methemoglobin: 1.3 % (ref 0.0–1.5)
O2 Saturation: 65 %
Total hemoglobin: 9.3 g/dL — ABNORMAL LOW (ref 12.0–16.0)

## 2019-12-29 LAB — LACTATE DEHYDROGENASE: LDH: 278 U/L — ABNORMAL HIGH (ref 98–192)

## 2019-12-29 MED ORDER — FUROSEMIDE 10 MG/ML IJ SOLN
80.0000 mg | Freq: Once | INTRAMUSCULAR | Status: AC
  Administered 2019-12-29: 80 mg via INTRAVENOUS
  Filled 2019-12-29: qty 8

## 2019-12-29 MED ORDER — TRAMADOL HCL 50 MG PO TABS
100.0000 mg | ORAL_TABLET | Freq: Four times a day (QID) | ORAL | Status: DC | PRN
  Administered 2019-12-29 – 2019-12-31 (×2): 100 mg via ORAL
  Filled 2019-12-29 (×2): qty 2

## 2019-12-29 MED ORDER — WARFARIN SODIUM 2 MG PO TABS
2.0000 mg | ORAL_TABLET | Freq: Once | ORAL | Status: AC
  Administered 2019-12-29: 2 mg via ORAL
  Filled 2019-12-29: qty 1

## 2019-12-29 MED ORDER — FUROSEMIDE 10 MG/ML IJ SOLN
20.0000 mg/h | INTRAVENOUS | Status: DC
  Administered 2019-12-29 – 2020-01-01 (×6): 20 mg/h via INTRAVENOUS
  Filled 2019-12-29 (×4): qty 25
  Filled 2019-12-29: qty 21
  Filled 2019-12-29 (×3): qty 25

## 2019-12-29 MED ORDER — POTASSIUM CHLORIDE CRYS ER 20 MEQ PO TBCR
40.0000 meq | EXTENDED_RELEASE_TABLET | ORAL | Status: AC
  Administered 2019-12-29 (×3): 40 meq via ORAL
  Filled 2019-12-29 (×3): qty 2

## 2019-12-29 MED ORDER — MAGIC MOUTHWASH W/LIDOCAINE: 1.0000 mL | Freq: Three times a day (TID) | ORAL | Status: DC | PRN

## 2019-12-29 MED ORDER — DIGOXIN 125 MCG PO TABS
0.1250 mg | ORAL_TABLET | Freq: Every day | ORAL | Status: DC
  Administered 2019-12-29 – 2020-01-06 (×9): 0.125 mg via ORAL
  Filled 2019-12-29 (×9): qty 1

## 2019-12-29 NOTE — Progress Notes (Signed)
[facility]   Reason for visit: Follow up on lactic acidosis  Interval History: no acute events.  He complains of mouth pain and asking for pain medication.  Blood cultures with ngtd.   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   Physical Exam: Constitutional:  Vitals:   12/29/19 0854 12/29/19 1122  BP:    Pulse:    Resp:    Temp: 97.9 F (36.6 C) 97.7 F (36.5 C)  SpO2:     patient appears in NAD Respiratory: Normal respiratory effort; CTA B LVAD GI: soft, nt, nd  Review of Systems: Constitutional: negative for fevers and chills Gastrointestinal: negative for nausea and diarrhea  Lab Results  Component Value Date   WBC 15.8 (H) 12/29/2019   HGB 9.0 (L) 12/29/2019   HCT 28.5 (L) 12/29/2019   MCV 81.2 12/29/2019   PLT 149 (L) 12/29/2019    Lab Results  Component Value Date   CREATININE 1.99 (H) 12/29/2019   BUN 25 (H) 12/29/2019   NA 132 (L) 12/29/2019   K 3.1 (L) 12/29/2019   CL 93 (L) 12/29/2019   CO2 28 12/29/2019    Lab Results  Component Value Date   ALT 16 12/27/2019   AST 34 12/27/2019   ALKPHOS 115 12/27/2019     Microbiology: Recent Results (from the past 240 hour(s))  Culture, blood (routine x 2)     Status: None (Preliminary result)   Collection Time: 12/27/19 10:38 AM   Specimen: BLOOD  Result Value Ref Range Status   Specimen Description BLOOD BLOOD RIGHT HAND  Final   Special Requests   Final    BOTTLES DRAWN AEROBIC AND ANAEROBIC Blood Culture adequate volume   Culture   Final    NO GROWTH < 24 HOURS Performed at [facility], [address], [place], [place] [address]    Report Status PENDING  Incomplete  SARS Coronavirus 2 by RT PCR (hospital order, performed in [facility] hospital lab) Nasopharyngeal Nasopharyngeal Swab     Status: None   Collection Time: 12/27/19 12:52 PM   Specimen: Nasopharyngeal Swab  Result Value Ref Range Status   SARS  Coronavirus 2 NEGATIVE NEGATIVE Final    Comment: (NOTE) SARS-CoV-2 target nucleic acids are NOT DETECTED.  The SARS-CoV-2 RNA is generally detectable in upper and lower respiratory specimens during the acute phase of infection. The lowest concentration of SARS-CoV-2 viral copies this assay can detect is 250 copies / mL. A negative result does not preclude SARS-CoV-2 infection and should not be used as the sole basis for treatment or other patient management decisions.  A negative result may occur with improper specimen collection / handling, submission of specimen other than nasopharyngeal swab, presence of viral mutation(s) within the areas targeted by this assay, and inadequate number of viral copies (<250 copies / mL). A negative result must be combined with clinical observations, patient history, and epidemiological information.  Fact Sheet for Patients:   BoilerBrush.com.cy  Fact Sheet for Healthcare Providers: https://pope.com/  This test is not yet approved or  cleared by the [place] FDA and has been authorized for detection and/or diagnosis of SARS-CoV-2 by FDA under an Emergency Use Authorization (EUA).  This EUA will remain in effect (meaning this test can be used) for the duration of the COVID-19 declaration under Section 564(b)(1) of the Act, 21 U.S.C. section 360bbb-3(b)(1), unless the authorization is terminated or revoked  sooner.  Performed at [facility], [address], [place], [place] [address]   Culture, blood (routine x 2)     Status: None (Preliminary result)   Collection Time: 12/27/19  1:22 PM   Specimen: BLOOD  Result Value Ref Range Status   Specimen Description BLOOD RIGHT ANTECUBITAL  Final   Special Requests   Final    BOTTLES DRAWN AEROBIC ONLY Blood Culture results may not be optimal due to an inadequate volume of blood received in culture bottles   Culture   Final    NO GROWTH < 24  HOURS Performed at [facility], [address], [place], [place] [address]    Report Status PENDING  Incomplete  MRSA PCR Screening     Status: None   Collection Time: 12/27/19  5:40 PM   Specimen: Nasopharyngeal  Result Value Ref Range Status   MRSA by PCR NEGATIVE NEGATIVE Final    Comment:        The GeneXpert MRSA Assay (FDA approved for NASAL specimens only), is one component of a comprehensive MRSA colonization surveillance program. It is not intended to diagnose MRSA infection nor to guide or monitor treatment for MRSA infections. Performed at [facility], [address] [place], [place] [address]     Impression/Plan:  1. Lactic acidosis - appears to be improving on milrinone per CCM.  No signs of acute infection.  On unasyn and will continue for now and monitor blood cultures.  If no changes, will go back to his chronic Keflex tomorrow.  2.  Mouth pain - has an ulcer/inflammatory lesion on the left side of his mouth.  It does not appear erythematous or infected.  May be ulcerated.  He may benefit from magic mouthwash with lidocaine but I will defer to the primary team if a small amount of lidocaine is ok to give or not.

## 2019-12-29 NOTE — Progress Notes (Addendum)
Advanced Heart Failure VAD Team Note  PCP-Cardiologist: Arvilla Meres, MD   Subjective:    On milrinone 0.25. NE started at 5. Ramp echo done yesterday with severe RV dysfunction Speed 5600-> 6200  Feels better today. Denies SOB, orthopnea or PND. CVP 23. Co-ox 65%  LVAD INTERROGATION:  HeartMate III LVAD:   Flow 5.7 liters/min, speed 6200, power 5.0 PI 2.6  Objective:    Vital Signs:   Temp:  [97.5 F (36.4 C)-98.5 F (36.9 C)] 97.7 F (36.5 C) (08/14 1122) Pulse Rate:  [46-144] 110 (08/14 0806) Resp:  [14-32] 24 (08/14 0806) BP: (82-139)/(68-102) 88/71 (08/14 1215) SpO2:  [88 %-100 %] 94 % (08/14 0806) Arterial Line BP: (71-100)/(58-79) 92/71 (08/14 0806) Weight:  [104.8 kg] 104.8 kg (08/14 0600) Last BM Date: 12/29/19 Mean arterial Pressure 70-80  Intake/Output:   Intake/Output Summary (Last 24 hours) at 12/29/2019 1459 Last data filed at 12/29/2019 0758 Gross per 24 hour  Intake 1271.47 ml  Output 450 ml  Net 821.47 ml     Physical Exam    General:  NAD.  HEENT: normal  Neck: supple. JVP to ear  Carotids 2+ bilat; no bruits. No lymphadenopathy or thryomegaly appreciated. Cor: LVAD hum.  + RV lift Lungs: Clear. Abdomen: obese soft, nontender, + distended. No hepatosplenomegaly. No bruits or masses. Good bowel sounds. Driveline site clean. Anchor in place.  Extremities: no cyanosis, clubbing, rash. Warm 2+ edema  Neuro: alert & oriented x 3. No focal deficits. Moves all 4 without problem    Telemetry   Sinus tach 100-110 Personally reviewed  Labs   Basic Metabolic Panel: Recent Labs  Lab 12/27/19 1017 12/27/19 1017 12/27/19 1022 12/27/19 1703 12/27/19 1749 12/27/19 2004 12/27/19 2043 12/28/19 0339 12/29/19 0420  NA 136   < > 135   < > 135 132* 131* 129* 132*  K 3.8   < > 3.8   < > 4.1 4.3 4.2 3.4* 3.1*  CL 106  --  102  --   --   --   --  92* 93*  CO2  --   --  14*  --   --   --   --  24 28  GLUCOSE 46*  --  48*  --   --   --   --  225*  154*  BUN 12  --  13  --   --   --   --  22* 25*  CREATININE 1.40*  --  1.68*  --   --   --   --  1.95* 1.99*  CALCIUM  --   --  9.4  --   --   --   --  8.1* 8.3*   < > = values in this interval not displayed.    Liver Function Tests: Recent Labs  Lab 12/27/19 1738  AST 34  ALT 16  ALKPHOS 115  BILITOT 4.2*  PROT 7.9  ALBUMIN 3.1*   No results for input(s): LIPASE, AMYLASE in the last 168 hours. No results for input(s): AMMONIA in the last 168 hours.  CBC: Recent Labs  Lab 12/27/19 1022 12/27/19 1703 12/27/19 2043 12/28/19 0339 12/28/19 0912 12/28/19 1800 12/29/19 0420  WBC 11.3*  --   --  13.7*  --   --  15.8*  HGB 9.9*   < > 10.9* 7.4* 7.1* 8.7* 9.0*  HCT 34.7*   < > 32.0* 24.1* 23.7* 28.1* 28.5*  MCV 86.8  --   --  81.1  --   --  81.2  PLT 215  --   --  161  --   --  149*   < > = values in this interval not displayed.    INR: Recent Labs  Lab 12/27/19 1022 12/27/19 1347 12/28/19 0339 12/29/19 0420  INR 2.4* 3.0* 3.0* 2.3*    Other results:  EKG:    Imaging   DG CHEST PORT 1 VIEW  Result Date: 12/27/2019 CLINICAL DATA:  Central line placement EXAM: PORTABLE CHEST 1 VIEW COMPARISON:  12/27/2019 FINDINGS: Right central line is in place with the tip at the cavoatrial junction. No pneumothorax. Cardiomegaly. LVAD unchanged. Prior median sternotomy. Scarring in the lingula. No overt edema. Small left pleural effusion. IMPRESSION: Right central line tip at the cavoatrial junction.  No pneumothorax. Cardiomegaly.  Small left effusion. Electronically Signed   By: Charlett Nose M.D.   On: 12/27/2019 21:58   ECHOCARDIOGRAM COMPLETE  Result Date: 12/28/2019    ECHOCARDIOGRAM REPORT   Patient Name:   Jonathan Wood Date of Exam: 12/28/2019 Medical Rec #:  [id number]           Height:       74.0 in Accession #:    [id number]          Weight:       233.9 lb Date of Birth:  05/07/1971          BSA:          2.323 m Patient Age:    48 years            BP:            88/67 mmHg Patient Gender: M                   HR:           100 bpm. Exam Location:  Inpatient Procedure: 2D Echo, Cardiac Doppler and Color Doppler Indications:    I50.22 Chronic systolic (congestive) heart failure  History:        Patient has prior history of Echocardiogram examinations, most                 recent 08/17/2019. CHF, COPD, Signs/Symptoms:Bacteremia; Risk                 Factors:Current Smoker and Sleep Apnea. LVAD. History of                 Driveline infection.  Sonographer:    Sheralyn Boatman RDCS Referring Phys: 36 Roxy Horseman SIMMONS  Sonographer Comments: Technically difficult study due to poor echo windows and suboptimal subcostal window. LVAD. LVAD speed is 5600. IMPRESSIONS  1. No thrombus is seen. The LVAD inlet cannula appears unobstructed. Left ventricular ejection fraction, by estimation, is <15%. The left ventricle has severely decreased function. The left ventricle demonstrates global hypokinesis. The left ventricular  internal cavity size was severely dilated. Left ventricular diastolic parameters are consistent with Grade III diastolic dysfunction (restrictive). Elevated left atrial pressure. There is the interventricular septum is flattened in diastole ('D' shaped left ventricle), consistent with right ventricular volume overload.  2. Right ventricular systolic function is severely reduced. The right ventricular size is severely enlarged. There is normal pulmonary artery systolic pressure. The estimated right ventricular systolic pressure is 23.8 mmHg.  3. Left atrial size was severely dilated.  4. Right atrial size was severely dilated.  5. The mitral valve is normal in structure. Mild to moderate mitral valve regurgitation.  6. The triangular "  v wave cutoff" shape of the tricuspid regurgitant jet is concerning for severe (wide-open) low pressure tricuspid insufficiency.  7. The aortic valve opens with each systole. The aortic valve is normal in structure. Aortic valve regurgitation is  not visualized. No aortic stenosis is present. Comparison(s): LV function appears unchanged, but there is broad displacement of the interventricular septum, suggesting less effective left ventricular unloading and/or right ventricular volume overload. There is further respiratory enhancement of the septal displacement. The aortic valve now opens with each contraction, whereas it was always closed on the previous study. There are findings concerning for severe tricuspid insufficiency. FINDINGS  Left Ventricle: No thrombus is seen. The LVAD inlet cannula appears unobstructed. The ventricular septum bows broadly left-to-right in systole (LV not fully decompressed) and right-to-left in diastole (RV volume overload). Left ventricular ejection fraction, by estimation, is <15%. The left ventricle has severely decreased function. The left ventricle demonstrates global hypokinesis. The left ventricular internal cavity size was severely dilated. There is no left ventricular hypertrophy. The interventricular septum is flattened in diastole ('D' shaped left ventricle), consistent with right ventricular volume overload. Left ventricular diastolic parameters are consistent with Grade III diastolic dysfunction (restrictive). Elevated left atrial  pressure. Right Ventricle: The right ventricular size is severely enlarged. No increase in right ventricular wall thickness. Right ventricular systolic function is severely reduced. There is normal pulmonary artery systolic pressure. The tricuspid regurgitant velocity is 2.17 m/s, and with an assumed right atrial pressure of 5 mmHg, the estimated right ventricular systolic pressure is 23.8 mmHg. Left Atrium: Left atrial size was severely dilated. Right Atrium: Right atrial size was severely dilated. Pericardium: There is no evidence of pericardial effusion. Mitral Valve: The mitral valve is normal in structure. Mild mitral annular calcification. Mild to moderate mitral valve  regurgitation, with centrally-directed jet. Tricuspid Valve: The triangular "v wave cutoff" shape of the tricuspid regurgitant jet is concerning for severe (wide-open) low pressure tricuspid insufficiency. The tricuspid valve is not well visualized. Tricuspid valve regurgitation is severe. Aortic Valve: The aortic valve opens with each systole. The aortic valve is normal in structure. Aortic valve regurgitation is not visualized. No aortic stenosis is present. Pulmonic Valve: The pulmonic valve was normal in structure. Pulmonic valve regurgitation is trivial. Aorta: The aortic root is normal in size and structure. Venous: The inferior vena cava was not well visualized. IAS/Shunts: No atrial level shunt detected by color flow Doppler.  LEFT VENTRICLE PLAX 2D LVIDd:         7.20 cm LVIDs:         7.00 cm LV PW:         1.20 cm LV IVS:        1.00 cm LVOT diam:     2.30 cm LV SV:         37 LV SV Index:   16 LVOT Area:     4.15 cm  LV Volumes (MOD) LV vol d, MOD A2C: 250.0 ml LV vol d, MOD A4C: 209.0 ml LV vol s, MOD A2C: 224.0 ml LV vol s, MOD A4C: 185.5 ml LV SV MOD A2C:     26.0 ml LV SV MOD A4C:     209.0 ml LV SV MOD BP:      27.2 ml RIGHT VENTRICLE TAPSE (M-mode): 1.0 cm LEFT ATRIUM             Index       RIGHT ATRIUM  Index LA diam:        5.20 cm 2.24 cm/m  RA Area:     29.00 cm LA Vol (A2C):   68.7 ml 29.57 ml/m RA Volume:   111.00 ml 47.78 ml/m LA Vol (A4C):   58.1 ml 25.01 ml/m LA Biplane Vol: 62.6 ml 26.95 ml/m  AORTIC VALVE LVOT Vmax:   57.70 cm/s LVOT Vmean:  37.550 cm/s LVOT VTI:    0.090 m  AORTA Ao Root diam: 3.30 cm MITRAL VALVE                TRICUSPID VALVE MV Area (PHT): 5.31 cm     TR Peak grad:   18.8 mmHg MV Decel Time: 143 msec     TR Vmax:        217.00 cm/s MV E velocity: 111.40 cm/s                             SHUNTS                             Systemic VTI:  0.09 m                             Systemic Diam: 2.30 cm Rachelle Hora Croitoru MD Electronically signed by Thurmon Fair  MD Signature Date/Time: 12/28/2019/11:42:17 AM    Final    ECHOCARDIOGRAM LIMITED  Result Date: 12/28/2019    ECHOCARDIOGRAM LIMITED REPORT   Patient Name:   Jonathan Wood Date of Exam: 12/28/2019 Medical Rec #:  [id number]           Height:       74.0 in Accession #:    [id number]          Weight:       233.9 lb Date of Birth:  Nov 22, 1970          BSA:          2.323 m Patient Age:    48 years            BP:           0/0 mmHg Patient Gender: M                   HR:           97 bpm. Exam Location:  Inpatient Procedure: Limited Color Doppler and Limited Echo Indications:    RAMP study for LVAD  History:        Patient has prior history of Echocardiogram examinations, most                 recent 12/28/2019.                  Limited RAMP study for LVAD.                 At maximum output tested (6200 rpm), the ventricular septum is                 midline and there is very brief opening of the aortic valve.                 There does not appear to be any additional deterioration in                 right ventricular function at the higher  LVAD settings.                 Please see the complete study from earlier today.  Sonographer:    Delcie Roch Referring Phys: [id number] Aveena Bari R Kathline Banbury  Thurmon Fair MD Electronically signed by Thurmon Fair MD Signature Date/Time: 12/28/2019/4:30:15 PM    Final      Medications:     Scheduled Medications: . (feeding supplement) PROSource Plus  30 mL Oral BID BM  . Chlorhexidine Gluconate Cloth  6 each Topical Daily  . docusate sodium  100 mg Oral BID  . DULoxetine  60 mg Oral Daily  . gabapentin  300 mg Oral TID  . insulin aspart  0-6 Units Subcutaneous TID AC & HS  . multivitamin with minerals  1 tablet Oral Daily  . pantoprazole  40 mg Oral BID  . sildenafil  20 mg Oral TID  . sodium chloride flush  10-40 mL Intracatheter Q12H  . Warfarin - Pharmacist Dosing Inpatient   Does not apply q1600  . zinc sulfate  220 mg Oral Daily    Infusions: .  sodium chloride Stopped (12/28/19 1149)  . ampicillin-sulbactam (UNASYN) IV 3 g (12/29/19 1344)  . milrinone 0.25 mcg/kg/min (12/29/19 1446)  . norepinephrine (LEVOPHED) Adult infusion 5 mcg/min (12/29/19 0700)    PRN Medications: sodium chloride, acetaminophen, albuterol, hydrOXYzine, sodium chloride flush  Assessment/Plan:    1. Lactic Acidosis likely due to cardiogenic shock - now resolved. 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 Co-ox improved to 74%. NE added due to low MAPs - Co-ox 65% today MAPs 70-80s  - Ramp echo on 8/13 with severe RV dysfunction. AoV opening every beat. Speed increased 5600-> 6200. Tolerating well. May be able to increase further  - Remains markedly volume overloaded. Start lasix gtt - on sildenafil 20 tid for RV failure . Add digoxin - wean NE slowly as tolerated   3.  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 Evaluated by TCTS and IR and found to be muscle flap with no draiable fluid collection 6/21 - Site looks ok today. CT of chest unremarkable, AF. - BCx negative - ID following. Continue unasyn one more day. Switch back to Keflex in am  -He is nota candidate for pump exchange given severe RV dysfunction and poor self-care   4. HM3 LVAD 09/06/2017. - Speed increased 5600 -> 6200 on 8/13 - LDH 278 - INR goal 2.0-2.5.  - INR 2.3. Discussed dosing with PharmD personally. - Off ASA with PUD.  5.Essential HTN - MAPs improved. Wean NE slowly  - hold hydral and spiro    6. OSA - sleep study with very severe OSA (AHI 69/hr) - continues to refuse therapy. We have arranged f/u in sleep clinic several times but he has not been compliant    7. H/o GI bleed    - 08/2018 EGD completed and showed gastritis and nonbleeding duodenal ulcers, colonoscopy with 5 polyps removed. Protonix was increased to BID.  - hgb down from 9.9>>7.4. Received RBCs - hgb stable at 9.0 today - He will stay off aspirin for now.   8. AKI - Scr 1.99 (1.4 on admit) - 2/2 HF and volume overload - continue milrinone and NE. IV Lasix  for diuresis - follow BMP   9. Hypokalemia - supp  CRITICAL CARE Performed by: Arvilla Meres  Total critical care time: 45 minutes  Critical care time was exclusive of separately billable procedures and treating other patients.  Critical care was necessary to treat or prevent imminent or life-threatening deterioration.  Critical care was time spent personally by me (independent of midlevel providers or residents) on the following activities: development of treatment plan with patient and/or surrogate as well as nursing, discussions with consultants, evaluation of patient's response to treatment, examination of patient, obtaining history from patient or surrogate, ordering and performing treatments and interventions, ordering and review of laboratory studies, ordering and review of radiographic studies, pulse oximetry and re-evaluation of patient's condition.    I reviewed the LVAD parameters from today, and compared the results to the patient's prior recorded data.  No programming changes were made.  The LVAD is functioning within specified parameters.  The patient performs LVAD self-test daily.  LVAD interrogation was negative for any significant power changes, alarms or PI events/speed drops.  LVAD equipment check completed and is in good working order.  Back-up equipment present.   LVAD education done on emergency procedures and precautions and reviewed exit site care.  Length of Stay: 2  Arvilla Meres, MD 12/29/2019, 2:59 PM  VAD Team --- VAD ISSUES ONLY--- Pager [phone] (7am - 7am)  Advanced Heart Failure Team  Pager  [phone] (M-F; 7a - 4p)  Please contact [facility] for night-coverage after hours (4p -7a ) and weekends on [email or web address]

## 2019-12-29 NOTE — Progress Notes (Signed)
ANTICOAGULATION CONSULT NOTE - Follow Up Consult  Pharmacy Consult for warfarin Indication: LVAD  No Known Allergies  Patient Measurements: Height: 6\' 2"  (188 cm) Weight: 104.8 kg (231 lb 0.7 oz) IBW/kg (Calculated) : 82.2 Heparin Dosing Weight: 98.9 kg   Vital Signs: Temp: 97.7 F (36.5 C) (08/14 1122) Temp Source: Oral (08/14 1122) BP: 88/71 (08/14 1215) Pulse Rate: 110 (08/14 0806)  Labs: Recent Labs    12/27/19 1022 12/27/19 1022 12/27/19 1204 12/27/19 1347 12/27/19 1703 12/28/19 0339 12/28/19 0339 12/28/19 0912 12/28/19 0912 12/28/19 1800 12/29/19 0420  HGB 9.9*  --   --   --    < > 7.4*   < > 7.1*   < > 8.7* 9.0*  HCT 34.7*  --   --   --    < > 24.1*   < > 23.7*  --  28.1* 28.5*  PLT 215  --   --   --   --  161  --   --   --   --  149*  LABPROT 25.6*   < >  --  30.0*  --  30.2*  --   --   --   --  24.5*  INR 2.4*   < >  --  3.0*  --  3.0*  --   --   --   --  2.3*  CREATININE 1.68*  --   --   --   --  1.95*  --   --   --   --  1.99*  TROPONINIHS 39*  --  37*  --   --   --   --   --   --   --   --    < > = values in this interval not displayed.    Estimated Creatinine Clearance: 59.8 mL/min (A) (by C-G formula based on SCr of 1.99 mg/dL (H)).   Medical History: Past Medical History:  Diagnosis Date  . Asthma   . CHF (congestive heart failure) (HCC)    a. 09/2016: EF 20-25% with cath showing normal cors  . GERD (gastroesophageal reflux disease)   . History of hiatal hernia   . LVAD (left ventricular assist device) present (HCC)   . OSA on CPAP 09/06/2018   Severe OSA with AHI 68/hr on CPAP at 12cm H2O    Medications:  Scheduled:  . (feeding supplement) PROSource Plus  30 mL Oral BID BM  . Chlorhexidine Gluconate Cloth  6 each Topical Daily  . digoxin  0.125 mg Oral Daily  . docusate sodium  100 mg Oral BID  . DULoxetine  60 mg Oral Daily  . furosemide  80 mg Intravenous Once  . gabapentin  300 mg Oral TID  . insulin aspart  0-6 Units Subcutaneous  TID AC & HS  . multivitamin with minerals  1 tablet Oral Daily  . pantoprazole  40 mg Oral BID  . potassium chloride  40 mEq Oral Q2H  . sildenafil  20 mg Oral TID  . sodium chloride flush  10-40 mL Intracatheter Q12H  . Warfarin - Pharmacist Dosing Inpatient   Does not apply q1600  . zinc sulfate  220 mg Oral Daily    Assessment: 48 yom presenting with upper wall chest pain - has hx of muscle flap wound infection growing MSSA in past followed by ID. On warfarin for hx LVAD HM3 (LD confirmed with patient on 8/10).  PTA regimen: 2.5 mg daily except 5 mg MWF  INR trending down from 3.0 and now therapeutic at 2.3 after holding two doses. Hgb up to 9 after receiving 2 units PRBC yesterday. Plts down to 149. LDH stable 278. No overt bleeding. Will restart warfarin at lower dose today.  Goal of Therapy:  INR 2-2.5 Monitor platelets by anticoagulation protocol: Yes   Plan:  Warfarin 2mg  PO tonight x1 Will discuss need for heparin gtt pending INR trend Monitor daily INR, CBC, and for s/sx of bleeding   , PharmD PGY2 Cardiology Pharmacy Resident Phone: [phone] 12/29/2019  4:11 PM  Please check [email or web address] for unit-specific pharmacy phone numbers.

## 2019-12-30 LAB — COMPREHENSIVE METABOLIC PANEL
ALT: 24 U/L (ref 0–44)
AST: 33 U/L (ref 15–41)
Albumin: 2.8 g/dL — ABNORMAL LOW (ref 3.5–5.0)
Alkaline Phosphatase: 100 U/L (ref 38–126)
Anion gap: 11 (ref 5–15)
BUN: 18 mg/dL (ref 6–20)
CO2: 30 mmol/L (ref 22–32)
Calcium: 8.5 mg/dL — ABNORMAL LOW (ref 8.9–10.3)
Chloride: 94 mmol/L — ABNORMAL LOW (ref 98–111)
Creatinine, Ser: 1.37 mg/dL — ABNORMAL HIGH (ref 0.61–1.24)
GFR calc Af Amer: >60 mL/min (ref >60)
GFR calc non Af Amer: >60 mL/min (ref >60)
Glucose, Bld: 118 mg/dL — ABNORMAL HIGH (ref 70–99)
Potassium: 3.4 mmol/L — ABNORMAL LOW (ref 3.5–5.1)
Sodium: 135 mmol/L (ref 135–145)
Total Bilirubin: 2.7 mg/dL — ABNORMAL HIGH (ref 0.3–1.2)
Total Protein: 7.5 g/dL (ref 6.5–8.1)

## 2019-12-30 LAB — COOXEMETRY PANEL
Carboxyhemoglobin: 1.9 % — ABNORMAL HIGH (ref 0.5–1.5)
Methemoglobin: 1.1 % (ref 0.0–1.5)
O2 Saturation: 59.4 %
Total hemoglobin: 10 g/dL — ABNORMAL LOW (ref 12.0–16.0)

## 2019-12-30 LAB — CBC
HCT: 30.2 % — ABNORMAL LOW (ref 39.0–52.0)
Hemoglobin: 9.3 g/dL — ABNORMAL LOW (ref 13.0–17.0)
MCH: 25.1 pg — ABNORMAL LOW (ref 26.0–34.0)
MCHC: 30.8 g/dL (ref 30.0–36.0)
MCV: 81.6 fL (ref 80.0–100.0)
Platelets: 144 10*3/uL — ABNORMAL LOW (ref 150–400)
RBC: 3.7 MIL/uL — ABNORMAL LOW (ref 4.22–5.81)
RDW: 19.3 % — ABNORMAL HIGH (ref 11.5–15.5)
WBC: 11.2 10*3/uL — ABNORMAL HIGH (ref 4.0–10.5)
nRBC: 0.3 % — ABNORMAL HIGH (ref 0.0–0.2)

## 2019-12-30 LAB — BASIC METABOLIC PANEL
Anion gap: 10 (ref 5–15)
BUN: 20 mg/dL (ref 6–20)
CO2: 30 mmol/L (ref 22–32)
Calcium: 8.6 mg/dL — ABNORMAL LOW (ref 8.9–10.3)
Chloride: 96 mmol/L — ABNORMAL LOW (ref 98–111)
Creatinine, Ser: 1.68 mg/dL — ABNORMAL HIGH (ref 0.61–1.24)
GFR calc Af Amer: 56 mL/min — ABNORMAL LOW (ref >60)
GFR calc non Af Amer: 48 mL/min — ABNORMAL LOW (ref >60)
Glucose, Bld: 137 mg/dL — ABNORMAL HIGH (ref 70–99)
Potassium: 3.3 mmol/L — ABNORMAL LOW (ref 3.5–5.1)
Sodium: 136 mmol/L (ref 135–145)

## 2019-12-30 LAB — GLUCOSE, CAPILLARY
Glucose-Capillary: 130 mg/dL — ABNORMAL HIGH (ref 70–99)
Glucose-Capillary: 195 mg/dL — ABNORMAL HIGH (ref 70–99)
Glucose-Capillary: 130 mg/dL — ABNORMAL HIGH (ref 70–99)
Glucose-Capillary: 200 mg/dL — ABNORMAL HIGH (ref 70–99)

## 2019-12-30 LAB — MAGNESIUM: Magnesium: 1.5 mg/dL — ABNORMAL LOW (ref 1.7–2.4)

## 2019-12-30 LAB — PROTIME-INR
INR: 1.7 — ABNORMAL HIGH (ref 0.8–1.2)
Prothrombin Time: 19.5 seconds — ABNORMAL HIGH (ref 11.4–15.2)

## 2019-12-30 LAB — LACTATE DEHYDROGENASE: LDH: 266 U/L — ABNORMAL HIGH (ref 98–192)

## 2019-12-30 MED ORDER — WARFARIN SODIUM 3 MG PO TABS: 3.0000 mg | ORAL_TABLET | Freq: Once | ORAL | Status: DC

## 2019-12-30 MED ORDER — POTASSIUM CHLORIDE CRYS ER 20 MEQ PO TBCR
40.0000 meq | EXTENDED_RELEASE_TABLET | Freq: Once | ORAL | Status: AC
  Administered 2019-12-30: 40 meq via ORAL

## 2019-12-30 MED ORDER — MAGNESIUM SULFATE 4 GM/100ML IV SOLN
4.0000 g | Freq: Once | INTRAVENOUS | Status: AC
  Administered 2019-12-30: 4 g via INTRAVENOUS
  Filled 2019-12-30: qty 100

## 2019-12-30 MED ORDER — CEPHALEXIN 500 MG PO CAPS
500.0000 mg | ORAL_CAPSULE | Freq: Three times a day (TID) | ORAL | Status: DC
  Administered 2019-12-30 – 2020-01-06 (×23): 500 mg via ORAL
  Filled 2019-12-30 (×24): qty 1

## 2019-12-30 MED ORDER — POTASSIUM CHLORIDE CRYS ER 20 MEQ PO TBCR
40.0000 meq | EXTENDED_RELEASE_TABLET | ORAL | Status: AC
  Administered 2019-12-30 (×3): 40 meq via ORAL
  Filled 2019-12-30: qty 2

## 2019-12-30 MED ORDER — WARFARIN SODIUM 2 MG PO TABS
4.0000 mg | ORAL_TABLET | Freq: Once | ORAL | Status: AC
  Administered 2019-12-30: 4 mg via ORAL
  Filled 2019-12-30: qty 2

## 2019-12-30 NOTE — Progress Notes (Signed)
[facility]   Reason for visit: Follow up on lactic acidosis  Interval History: no acute events. 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   Physical Exam: Constitutional:  Vitals:   12/30/19 1200 12/30/19 1300  BP: (!) 81/54 (!) 70/51  Pulse:  (!) 47  Resp: (!) 24 19  Temp:    SpO2: 95% 99%   patient appears in NAD Respiratory: Normal respiratory effort; CTA B LVAD   Review of Systems: Constitutional: negative for fevers and chills Gastrointestinal: negative for nausea and diarrhea  Lab Results  Component Value Date   WBC 11.2 (H) 12/30/2019   HGB 9.3 (L) 12/30/2019   HCT 30.2 (L) 12/30/2019   MCV 81.6 12/30/2019   PLT 144 (L) 12/30/2019    Lab Results  Component Value Date   CREATININE 1.68 (H) 12/30/2019   BUN 20 12/30/2019   NA 136 12/30/2019   K 3.3 (L) 12/30/2019   CL 96 (L) 12/30/2019   CO2 30 12/30/2019    Lab Results  Component Value Date   ALT 16 12/27/2019   AST 34 12/27/2019   ALKPHOS 115 12/27/2019     Microbiology: Recent Results (from the past 240 hour(s))  Culture, blood (routine x 2)     Status: None (Preliminary result)   Collection Time: 12/27/19 10:38 AM   Specimen: BLOOD  Result Value Ref Range Status   Specimen Description BLOOD BLOOD RIGHT HAND  Final   Special Requests   Final    BOTTLES DRAWN AEROBIC AND ANAEROBIC Blood Culture adequate volume   Culture   Final    NO GROWTH 3 DAYS Performed at [facility], [address] [place], [place] [address]    Report Status PENDING  Incomplete  SARS Coronavirus 2 by RT PCR (hospital order, performed in [facility] hospital lab) Nasopharyngeal Nasopharyngeal Swab     Status: None   Collection Time: 12/27/19 12:52 PM   Specimen: Nasopharyngeal Swab  Result Value Ref Range Status   SARS Coronavirus 2 NEGATIVE NEGATIVE Final    Comment: (NOTE) SARS-CoV-2 target nucleic  acids are NOT DETECTED.  The SARS-CoV-2 RNA is generally detectable in upper and lower respiratory specimens during the acute phase of infection. The lowest concentration of SARS-CoV-2 viral copies this assay can detect is 250 copies / mL. A negative result does not preclude SARS-CoV-2 infection and should not be used as the sole basis for treatment or other patient management decisions.  A negative result may occur with improper specimen collection / handling, submission of specimen other than nasopharyngeal swab, presence of viral mutation(s) within the areas targeted by this assay, and inadequate number of viral copies (<250 copies / mL). A negative result must be combined with clinical observations, patient history, and epidemiological information.  Fact Sheet for Patients:   BoilerBrush.com.cy  Fact Sheet for Healthcare Providers: https://pope.com/  This test is not yet approved or  cleared by the [place] FDA and has been authorized for detection and/or diagnosis of SARS-CoV-2 by FDA under an Emergency Use Authorization (EUA).  This EUA will remain in effect (meaning this test can be used) for the duration of the COVID-19 declaration under Section 564(b)(1) of the Act, 21 U.S.C. section 360bbb-3(b)(1), unless the authorization is terminated or revoked sooner.  Performed at [facility], [address] [place], [place] [address]   Culture, blood (routine x 2)  Status: None (Preliminary result)   Collection Time: 12/27/19  1:22 PM   Specimen: BLOOD  Result Value Ref Range Status   Specimen Description BLOOD RIGHT ANTECUBITAL  Final   Special Requests   Final    BOTTLES DRAWN AEROBIC ONLY Blood Culture results may not be optimal due to an inadequate volume of blood received in culture bottles   Culture   Final    NO GROWTH 3 DAYS Performed at [facility], [address], [place], [place] [address]     Report Status PENDING  Incomplete  MRSA PCR Screening     Status: None   Collection Time: 12/27/19  5:40 PM   Specimen: Nasopharyngeal  Result Value Ref Range Status   MRSA by PCR NEGATIVE NEGATIVE Final    Comment:        The GeneXpert MRSA Assay (FDA approved for NASAL specimens only), is one component of a comprehensive MRSA colonization surveillance program. It is not intended to diagnose MRSA infection nor to guide or monitor treatment for MRSA infections. Performed at [facility], [address], [place], [place] [address]     Impression/Plan:  1. Lactic acidosis - appears to be improving on milrinone per CCM.  No signs of acute infection.   He has been transitioned back to Keflex appropriately.  Will sign off, call with questions

## 2019-12-30 NOTE — Progress Notes (Addendum)
ANTICOAGULATION CONSULT NOTE - Follow Up Consult  Pharmacy Consult for warfarin Indication: LVAD  No Known Allergies  Patient Measurements: Height: 6\' 2"  (188 cm) Weight: 102.6 kg (226 lb 3.1 oz) IBW/kg (Calculated) : 82.2 Heparin Dosing Weight: 98.9 kg   Vital Signs: Temp: 97.9 F (36.6 C) (08/15 0838) Temp Source: Oral (08/15 0838) BP: 77/59 (08/15 0400) Pulse Rate: 88 (08/15 0843)  Labs: Recent Labs    12/27/19 1204 12/27/19 1347 12/28/19 0339 12/28/19 0912 12/28/19 1800 12/28/19 1800 12/29/19 0420 12/30/19 0325  HGB  --    < > 7.4*   < > 8.7*   < > 9.0* 9.3*  HCT  --    < > 24.1*   < > 28.1*  --  28.5* 30.2*  PLT  --   --  161  --   --   --  149* 144*  LABPROT  --    < > 30.2*  --   --   --  24.5* 19.5*  INR  --    < > 3.0*  --   --   --  2.3* 1.7*  CREATININE  --   --  1.95*  --   --   --  1.99* 1.68*  TROPONINIHS 37*  --   --   --   --   --   --   --    < > = values in this interval not displayed.    Estimated Creatinine Clearance: 70.3 mL/min (A) (by C-G formula based on SCr of 1.68 mg/dL (H)).   Medical History: Past Medical History:  Diagnosis Date  . Asthma   . CHF (congestive heart failure) (HCC)    a. 09/2016: EF 20-25% with cath showing normal cors  . GERD (gastroesophageal reflux disease)   . History of hiatal hernia   . LVAD (left ventricular assist device) present (HCC)   . OSA on CPAP 09/06/2018   Severe OSA with AHI 68/hr on CPAP at 12cm H2O    Medications:  Scheduled:  . (feeding supplement) PROSource Plus  30 mL Oral BID BM  . Chlorhexidine Gluconate Cloth  6 each Topical Daily  . digoxin  0.125 mg Oral Daily  . docusate sodium  100 mg Oral BID  . DULoxetine  60 mg Oral Daily  . gabapentin  300 mg Oral TID  . insulin aspart  0-6 Units Subcutaneous TID AC & HS  . multivitamin with minerals  1 tablet Oral Daily  . pantoprazole  40 mg Oral BID  . potassium chloride  40 mEq Oral Q4H  . sildenafil  20 mg Oral TID  . sodium chloride  flush  10-40 mL Intracatheter Q12H  . Warfarin - Pharmacist Dosing Inpatient   Does not apply q1600  . zinc sulfate  220 mg Oral Daily    Assessment: 48 yom presenting with upper wall chest pain - has hx of muscle flap wound infection growing MSSA in past followed by ID. On warfarin for hx LVAD HM3 (LD confirmed with patient on 8/10).  PTA regimen: 2.5 mg daily except 5 mg MWF  INR now subtherapeutic at 1.7 after restarting warfarin yesterday. Hgb stable at 9.3, received 2 units PRBC 8/13. Plts down to 144. LDH stable 266. No overt bleeding. Patient eating more today.  Goal of Therapy:  INR 2-2.5 Monitor platelets by anticoagulation protocol: Yes   Plan:  Warfarin 4mg  PO tonight x1 Will discuss need for heparin gtt pending INR trend Monitor daily INR, CBC,  and for s/sx of bleeding   Tama Headings, PharmD PGY2 Cardiology Pharmacy Resident Phone: [phone] 12/30/2019  11:20 AM  Please check [email or web address] for unit-specific pharmacy phone numbers.

## 2019-12-30 NOTE — Progress Notes (Signed)
Patient placed on CPAP. Pt is doing well at this moment. RT will continue to monitor.

## 2019-12-30 NOTE — Progress Notes (Signed)
Advanced Heart Failure VAD Team Note  PCP-Cardiologist: Arvilla Meres, MD   Subjective:     - Ramp echo 8/13 with severe RV dysfunction Speed 5600-> 6200  On milrinone 0.25 and NE 5. Co-ox 59%. Lasix gtt started yesterday. Brisk diuresis. Weight down 5 pounds. CVP still 23.   Creatinine improved 1.95 -> 1.68  Feeling better. No CP or SOB. Denies orthopnea or PND.     LVAD INTERROGATION:  HeartMate III LVAD:   Flow 5.5 liters/min, speed 6200, power 5.0 PI 2.7 VAD interrogated personally. Parameters stable.  Objective:    Vital Signs:   Temp:  [97.5 F (36.4 C)-97.9 F (36.6 C)] 97.9 F (36.6 C) (08/15 0838) Pulse Rate:  [59-179] 88 (08/15 0843) Resp:  [17-35] 18 (08/15 0500) BP: (69-118)/(57-101) 77/59 (08/15 0400) SpO2:  [86 %-100 %] 94 % (08/15 0500) Arterial Line BP: (81-150)/(66-134) 90/84 (08/15 0500) Weight:  [102.6 kg] 102.6 kg (08/15 0645) Last BM Date: 12/29/19 Mean arterial Pressure 70-80s  Intake/Output:   Intake/Output Summary (Last 24 hours) at 12/30/2019 1121 Last data filed at 12/30/2019 1000 Gross per 24 hour  Intake 1713.35 ml  Output 5850 ml  Net -4136.65 ml     Physical Exam    General:  NAD.  HEENT: normal  Neck: supple. JVP to ear  Carotids 2+ bilat; no bruits. No lymphadenopathy or thryomegaly appreciated. Cor: LVAD hum.  + RV lift Lungs: Clear. Abdomen: obese soft, nontender, + distended. No hepatosplenomegaly. No bruits or masses. Good bowel sounds. Driveline site clean. Anchor in place.  Extremities: no cyanosis, clubbing, rash. Warm 1+ edema  Neuro: alert & oriented x 3. No focal deficits. Moves all 4 without problem    Telemetry   Sinus tach 100-120 Personally reviewed  Labs   Basic Metabolic Panel: Recent Labs  Lab 12/27/19 1017 12/27/19 1017 12/27/19 1022 12/27/19 1022 12/27/19 1703 12/27/19 2004 12/27/19 2043 12/28/19 0339 12/29/19 0420 12/30/19 0325  NA 136   < > 135  --    < > 132* 131* 129* 132* 136  K  3.8   < > 3.8  --    < > 4.3 4.2 3.4* 3.1* 3.3*  CL 106  --  102  --   --   --   --  92* 93* 96*  CO2  --   --  14*  --   --   --   --  24 28 30   GLUCOSE 46*  --  48*  --   --   --   --  225* 154* 137*  BUN 12  --  13  --   --   --   --  22* 25* 20  CREATININE 1.40*  --  1.68*  --   --   --   --  1.95* 1.99* 1.68*  CALCIUM  --   --  9.4   < >  --   --   --  8.1* 8.3* 8.6*  MG  --   --   --   --   --   --   --   --   --  1.5*   < > = values in this interval not displayed.    Liver Function Tests: Recent Labs  Lab 12/27/19 1738  AST 34  ALT 16  ALKPHOS 115  BILITOT 4.2*  PROT 7.9  ALBUMIN 3.1*   No results for input(s): LIPASE, AMYLASE in the last 168 hours. No results for input(s): AMMONIA in the  last 168 hours.  CBC: Recent Labs  Lab 12/27/19 1022 12/27/19 1703 12/28/19 0339 12/28/19 0912 12/28/19 1800 12/29/19 0420 12/30/19 0325  WBC 11.3*  --  13.7*  --   --  15.8* 11.2*  HGB 9.9*   < > 7.4* 7.1* 8.7* 9.0* 9.3*  HCT 34.7*   < > 24.1* 23.7* 28.1* 28.5* 30.2*  MCV 86.8  --  81.1  --   --  81.2 81.6  PLT 215  --  161  --   --  149* 144*   < > = values in this interval not displayed.    INR: Recent Labs  Lab 12/27/19 1022 12/27/19 1347 12/28/19 0339 12/29/19 0420 12/30/19 0325  INR 2.4* 3.0* 3.0* 2.3* 1.7*    Other results:     Imaging   ECHOCARDIOGRAM LIMITED  Result Date: 12/28/2019    ECHOCARDIOGRAM LIMITED REPORT   Patient Name:   Jonathan Wood Date of Exam: 12/28/2019 Medical Rec #:  [id number]           Height:       74.0 in Accession #:    [id number]          Weight:       233.9 lb Date of Birth:  09-20-1970          BSA:          2.323 m Patient Age:    49 years            BP:           0/0 mmHg Patient Gender: M                   HR:           97 bpm. Exam Location:  Inpatient Procedure: Limited Color Doppler and Limited Echo Indications:    RAMP study for LVAD  History:        Patient has prior history of Echocardiogram examinations, most                  recent 12/28/2019.                  Limited RAMP study for LVAD.                 At maximum output tested (6200 rpm), the ventricular septum is                 midline and there is very brief opening of the aortic valve.                 There does not appear to be any additional deterioration in                 right ventricular function at the higher LVAD settings.                 Please see the complete study from earlier today.  Sonographer:    Delcie Roch Referring Phys: [id number] Katlin Ciszewski R Bonna Steury  Thurmon Fair MD Electronically signed by Thurmon Fair MD Signature Date/Time: 12/28/2019/4:30:15 PM    Final      Medications:     Scheduled Medications:  (feeding supplement) PROSource Plus  30 mL Oral BID BM   Chlorhexidine Gluconate Cloth  6 each Topical Daily   digoxin  0.125 mg Oral Daily   docusate sodium  100 mg Oral BID   DULoxetine  60 mg Oral Daily   gabapentin  300 mg Oral TID   insulin aspart  0-6 Units Subcutaneous TID AC & HS   multivitamin with minerals  1 tablet Oral Daily   pantoprazole  40 mg Oral BID   potassium chloride  40 mEq Oral Q4H   sildenafil  20 mg Oral TID   sodium chloride flush  10-40 mL Intracatheter Q12H   Warfarin - Pharmacist Dosing Inpatient   Does not apply q1600   zinc sulfate  220 mg Oral Daily    Infusions:  sodium chloride 250 mL (12/30/19 1047)   ampicillin-sulbactam (UNASYN) IV 3 g (12/30/19 0900)   furosemide (LASIX) infusion 20 mg/hr (12/30/19 1044)   milrinone 0.25 mcg/kg/min (12/30/19 1045)   norepinephrine (LEVOPHED) Adult infusion Stopped (12/30/19 0310)    PRN Medications: sodium chloride, acetaminophen, albuterol, hydrOXYzine, sodium chloride flush, traMADol  Assessment/Plan:    1. Lactic Acidosis likely due to cardiogenic shock - now resolved. 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 NE added - Co-ox 59% today MAPs 70-80s  - Ramp echo on 8/13 with severe RV dysfunction. AoV opening every beat. Speed increased 5600-> 6200. Tolerating well. May be able to increase further  - Remains markedly volume overloaded. Diuresing well on lasix gtt. Weight down 5. Continue lasix gtt. Supp K aggressively - on sildenafil 20 tid and digoxin 0.125 for RV failure. - Once better diuresed will wean NE (and milrinone) slowly as tolerated   3.  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 Evaluated by TCTS and IR and found to be muscle flap with no draiable fluid collection 6/21 - Site looks ok today. CT of chest unremarkable, AF. - BCx negative - ID following. Stop unasyn today and switch back to Keflex -He is nota candidate for pump exchange given severe RV dysfunction and poor self-care   4. HM3 LVAD 09/06/2017. - Speed increased 5600 -> 6200 on 8/13 - LDH 2266 - INR goal 2.0-2.5.  - INR 1.7 Continue warfarin Discussed dosing with PharmD personally. - Off ASA with PUD.  5.Essential HTN - MAPs improved on NE. Wean slowly once more fully diuresed  - hold hydral and spiro    6. OSA - sleep study with very severe OSA (AHI 69/hr) - continues to refuse therapy. We have arranged f/u in sleep clinic several times but he has not been compliant    7. H/o GI bleed  - 08/2018 EGD completed and showed gastritis and nonbleeding duodenal ulcers, colonoscopy with 5 polyps removed. Protonix was increased to BID.  - hgb down from 9.9>>7.4. Received RBCs - hgb stable at 9.0 today - He will stay off aspirin for now.   8. AKI - Scr 1.99 (1.4 on admit). Improved with support now 1.68.  - 2/2 HF and volume overload - continue milrinone and NE. IV Lasix for diuresis - follow BMP   9. Hypokalemia - K 3.3  supp  10. Hypomag - supp  CRITICAL CARE Performed by: Arvilla Meres  Total critical care time: 35 minutes  Critical care time was exclusive of separately billable procedures and treating other patients.  Critical care was necessary to treat or prevent imminent or life-threatening deterioration.  Critical care was time spent personally by me (independent of midlevel providers or residents) on  the following activities: development of treatment plan with patient and/or surrogate as well as nursing, discussions with consultants, evaluation of patient's response to treatment, examination of patient, obtaining history from patient or surrogate, ordering and performing treatments and interventions, ordering and review of laboratory studies, ordering and review of radiographic studies, pulse oximetry and re-evaluation of patient's condition.    Length of Stay: 3  Arvilla Meres, MD 12/30/2019, 11:21 AM  VAD Team --- VAD ISSUES ONLY--- Pager [phone] (7am - 7am)  Advanced Heart Failure Team  Pager [phone] (M-F; 7a - 4p)  Please contact [facility] for night-coverage after hours (4p -7a ) and weekends on [email or web address]

## 2019-12-31 LAB — BASIC METABOLIC PANEL
Anion gap: 10 (ref 5–15)
BUN: 19 mg/dL (ref 6–20)
CO2: 33 mmol/L — ABNORMAL HIGH (ref 22–32)
Calcium: 8.9 mg/dL (ref 8.9–10.3)
Chloride: 92 mmol/L — ABNORMAL LOW (ref 98–111)
Creatinine, Ser: 1.61 mg/dL — ABNORMAL HIGH (ref 0.61–1.24)
GFR calc Af Amer: 59 mL/min — ABNORMAL LOW (ref >60)
GFR calc non Af Amer: 51 mL/min — ABNORMAL LOW (ref >60)
Glucose, Bld: 120 mg/dL — ABNORMAL HIGH (ref 70–99)
Potassium: 4 mmol/L (ref 3.5–5.1)
Sodium: 135 mmol/L (ref 135–145)

## 2019-12-31 LAB — GLUCOSE, CAPILLARY
Glucose-Capillary: 211 mg/dL — ABNORMAL HIGH (ref 70–99)
Glucose-Capillary: 97 mg/dL (ref 70–99)
Glucose-Capillary: 140 mg/dL — ABNORMAL HIGH (ref 70–99)
Glucose-Capillary: 152 mg/dL — ABNORMAL HIGH (ref 70–99)

## 2019-12-31 LAB — COOXEMETRY PANEL
Carboxyhemoglobin: 1.8 % — ABNORMAL HIGH (ref 0.5–1.5)
Carboxyhemoglobin: 1.8 % — ABNORMAL HIGH (ref 0.5–1.5)
Methemoglobin: 1.1 % (ref 0.0–1.5)
Methemoglobin: 1.1 % (ref 0.0–1.5)
O2 Saturation: 49.2 %
O2 Saturation: 46.7 %
Total hemoglobin: 10.2 g/dL — ABNORMAL LOW (ref 12.0–16.0)
Total hemoglobin: 9.9 g/dL — ABNORMAL LOW (ref 12.0–16.0)

## 2019-12-31 LAB — CBC
HCT: 30.9 % — ABNORMAL LOW (ref 39.0–52.0)
Hemoglobin: 9.7 g/dL — ABNORMAL LOW (ref 13.0–17.0)
MCH: 25.8 pg — ABNORMAL LOW (ref 26.0–34.0)
MCHC: 31.4 g/dL (ref 30.0–36.0)
MCV: 82.2 fL (ref 80.0–100.0)
Platelets: 147 10*3/uL — ABNORMAL LOW (ref 150–400)
RBC: 3.76 MIL/uL — ABNORMAL LOW (ref 4.22–5.81)
RDW: 19.6 % — ABNORMAL HIGH (ref 11.5–15.5)
WBC: 11.8 10*3/uL — ABNORMAL HIGH (ref 4.0–10.5)
nRBC: 0 % (ref 0.0–0.2)

## 2019-12-31 LAB — MAGNESIUM: Magnesium: 1.5 mg/dL — ABNORMAL LOW (ref 1.7–2.4)

## 2019-12-31 LAB — HEPARIN LEVEL (UNFRACTIONATED): Heparin Unfractionated: <0.1 IU/mL — ABNORMAL LOW (ref 0.30–0.70)

## 2019-12-31 LAB — LACTATE DEHYDROGENASE: LDH: 282 U/L — ABNORMAL HIGH (ref 98–192)

## 2019-12-31 LAB — PROTIME-INR
INR: 1.5 — ABNORMAL HIGH (ref 0.8–1.2)
Prothrombin Time: 17.4 seconds — ABNORMAL HIGH (ref 11.4–15.2)

## 2019-12-31 MED ORDER — WARFARIN SODIUM 5 MG PO TABS
5.0000 mg | ORAL_TABLET | Freq: Once | ORAL | Status: AC
  Administered 2019-12-31: 5 mg via ORAL
  Filled 2019-12-31: qty 1

## 2019-12-31 MED ORDER — MAGNESIUM SULFATE 4 GM/100ML IV SOLN
4.0000 g | Freq: Once | INTRAVENOUS | Status: AC
  Administered 2019-12-31: 4 g via INTRAVENOUS
  Filled 2019-12-31: qty 100

## 2019-12-31 MED ORDER — HEPARIN (PORCINE) 25000 UT/250ML-% IV SOLN
600.0000 [IU]/h | INTRAVENOUS | Status: DC
  Administered 2019-12-31 – 2020-01-02 (×2): 600 [IU]/h via INTRAVENOUS
  Filled 2019-12-31 (×2): qty 250

## 2019-12-31 NOTE — Progress Notes (Signed)
CSW met at bedside with patient who states he is feeling better today than yesterday. Patient's mother requested a letter from [place] verifying patient's admission for the court as patient had to leave court urgently due to illness. CSW provided mother who was at bedside with latter. CSW continues to follow for supportive needs. [name], [place], [place]

## 2019-12-31 NOTE — Progress Notes (Signed)
ANTICOAGULATION CONSULT NOTE - Follow Up Consult  Pharmacy Consult for warfarin + heparin Indication: LVAD  No Known Allergies  Patient Measurements: Height: 6\' 2"  (188 cm) Weight: 99.9 kg (220 lb 3.8 oz) IBW/kg (Calculated) : 82.2 Heparin Dosing Weight: 98.9 kg   Vital Signs: Temp: 97.3 F (36.3 C) (08/16 2049) Temp Source: Axillary (08/16 2049) BP: 96/84 (08/16 2200) Pulse Rate: 118 (08/16 2200)  Labs: Recent Labs    12/29/19 0420 12/29/19 0420 12/30/19 0325 12/30/19 1410 12/31/19 0337 12/31/19 2135  HGB 9.0*   < > 9.3*  --  9.7*  --   HCT 28.5*  --  30.2*  --  30.9*  --   PLT 149*  --  144*  --  147*  --   LABPROT 24.5*  --  19.5*  --  17.4*  --   INR 2.3*  --  1.7*  --  1.5*  --   HEPARINUNFRC  --   --   --   --   --  <0.10*  CREATININE 1.99*   < > 1.68* 1.37* 1.61*  --    < > = values in this interval not displayed.    Estimated Creatinine Clearance: 72.4 mL/min (A) (by C-G formula based on SCr of 1.61 mg/dL (H)).   Medical History: Past Medical History:  Diagnosis Date   Asthma    CHF (congestive heart failure) (HCC)    a. 09/2016: EF 20-25% with cath showing normal cors   GERD (gastroesophageal reflux disease)    History of hiatal hernia    LVAD (left ventricular assist device) present (HCC)    OSA on CPAP 09/06/2018   Severe OSA with AHI 68/hr on CPAP at 12cm H2O    Medications:  Scheduled:   (feeding supplement) PROSource Plus  30 mL Oral BID BM   cephALEXin  500 mg Oral Q8H   Chlorhexidine Gluconate Cloth  6 each Topical Daily   digoxin  0.125 mg Oral Daily   docusate sodium  100 mg Oral BID   DULoxetine  60 mg Oral Daily   gabapentin  300 mg Oral TID   insulin aspart  0-6 Units Subcutaneous TID AC & HS   multivitamin with minerals  1 tablet Oral Daily   pantoprazole  40 mg Oral BID   sildenafil  20 mg Oral TID   sodium chloride flush  10-40 mL Intracatheter Q12H   Warfarin - Pharmacist Dosing Inpatient   Does not apply  q1600   zinc sulfate  220 mg Oral Daily    Assessment: 48 yom presenting with upper wall chest pain - has hx of muscle flap wound infection growing MSSA in past followed by ID. On warfarin for hx LVAD HM3 (LD confirmed with patient on 8/10).  PTA regimen: 2.5 mg daily except 5 mg MWF  Initial heparin level subtherapeutic as expected, will not titrate.  Goal of Therapy:  INR 2-2.5 Monitor platelets by anticoagulation protocol: Yes   Plan:  Continue heparin 600 units/h Daily heparin level   10/10, PharmD, BCPS Clinical Pharmacist [phone] Please check AMION for all [facility] Pharmacy numbers 12/31/2019

## 2019-12-31 NOTE — Progress Notes (Addendum)
Advanced Heart Failure VAD Team Note  PCP-Cardiologist: Arvilla Meres, MD   Subjective:     - Ramp echo 8/13 with severe RV dysfunction Speed 5600-> 6200  - Off norepi >24 hours. CO-OX 49%. Remains on milrinone 0.25 mcg.   - Remains on lasix drip at 20 mg per hour. Brisk diuresis noted. Creatinine unchanged 1.6.    Walked this morning. Denies SOB.      LVAD INTERROGATION:  HeartMate III LVAD:   Flow 5.6 liters/min, speed 6200, power 5.0 PI 2.6 VAD interrogated personally. Parameters stable.  Objective:    Vital Signs:   Temp:  [97.7 F (36.5 C)-98.3 F (36.8 C)] 98.3 F (36.8 C) (08/15 2000) Pulse Rate:  [47-158] 111 (08/16 0600) Resp:  [17-32] 23 (08/16 0600) BP: (70-104)/(51-84) 104/84 (08/16 0400) SpO2:  [93 %-100 %] 97 % (08/16 0600) Arterial Line BP: (79-105)/(66-97) 79/70 (08/16 0600) Weight:  [99.9 kg] 99.9 kg (08/16 0500) Last BM Date: 12/30/19 Mean arterial Pressure 70-90s   Intake/Output:   Intake/Output Summary (Last 24 hours) at 12/31/2019 0746 Last data filed at 12/31/2019 0600 Gross per 24 hour  Intake 1928.86 ml  Output 6050 ml  Net -4121.14 ml     Physical Exam   CVP 16 personally checked.  Physical Exam: GENERAL: No acute distress. HEENT: normal  NECK: Supple, JVP to jaw  .  2+ bilaterally, no bruits.  No lymphadenopathy or thyromegaly appreciated.  RIJ CARDIAC:  Mechanical heart sounds with LVAD hum present.  LUNGS:  Clear to auscultation bilaterally.  ABDOMEN:  Soft, round, nontender, positive bowel sounds x4.     LVAD exit site: well-healed and incorporated.  Dressing dry and intact.  No erythema or drainage.  Stabilization device present and accurately applied.  Driveline dressing is being changed daily per sterile technique. EXTREMITIES:  Warm and dry, no cyanosis, clubbing, rash or edema  NEUROLOGIC:  Alert and oriented x 3.    No aphasia.  No dysarthria.  Affect pleasant.      Telemetry   Sinus Tach 100-110s   Labs    Basic Metabolic Panel: Recent Labs  Lab 12/28/19 0339 12/28/19 0339 12/29/19 0420 12/29/19 0420 12/30/19 0325 12/30/19 1410 12/31/19 0337  NA 129*  --  132*  --  136 135 135  K 3.4*  --  3.1*  --  3.3* 3.4* 4.0  CL 92*  --  93*  --  96* 94* 92*  CO2 24  --  28  --  30 30 33*  GLUCOSE 225*  --  154*  --  137* 118* 120*  BUN 22*  --  25*  --  20 18 19   CREATININE 1.95*  --  1.99*  --  1.68* 1.37* 1.61*  CALCIUM 8.1*   < > 8.3*   < > 8.6* 8.5* 8.9  MG  --   --   --   --  1.5*  --  1.5*   < > = values in this interval not displayed.    Liver Function Tests: Recent Labs  Lab 12/27/19 1738 12/30/19 1410  AST 34 33  ALT 16 24  ALKPHOS 115 100  BILITOT 4.2* 2.7*  PROT 7.9 7.5  ALBUMIN 3.1* 2.8*   No results for input(s): LIPASE, AMYLASE in the last 168 hours. No results for input(s): AMMONIA in the last 168 hours.  CBC: Recent Labs  Lab 12/27/19 1022 12/27/19 1703 12/28/19 0339 12/28/19 0339 12/28/19 0912 12/28/19 1800 12/29/19 0420 12/30/19 0325 12/31/19 0337  WBC  11.3*  --  13.7*  --   --   --  15.8* 11.2* 11.8*  HGB 9.9*   < > 7.4*   < > 7.1* 8.7* 9.0* 9.3* 9.7*  HCT 34.7*   < > 24.1*   < > 23.7* 28.1* 28.5* 30.2* 30.9*  MCV 86.8  --  81.1  --   --   --  81.2 81.6 82.2  PLT 215  --  161  --   --   --  149* 144* 147*   < > = values in this interval not displayed.    INR: Recent Labs  Lab 12/27/19 1347 12/28/19 0339 12/29/19 0420 12/30/19 0325 12/31/19 0337  INR 3.0* 3.0* 2.3* 1.7* 1.5*    Other results:     Imaging   No results found.   Medications:     Scheduled Medications: . (feeding supplement) PROSource Plus  30 mL Oral BID BM  . cephALEXin  500 mg Oral Q8H  . Chlorhexidine Gluconate Cloth  6 each Topical Daily  . digoxin  0.125 mg Oral Daily  . docusate sodium  100 mg Oral BID  . DULoxetine  60 mg Oral Daily  . gabapentin  300 mg Oral TID  . insulin aspart  0-6 Units Subcutaneous TID AC & HS  . multivitamin with minerals  1  tablet Oral Daily  . pantoprazole  40 mg Oral BID  . sildenafil  20 mg Oral TID  . sodium chloride flush  10-40 mL Intracatheter Q12H  . Warfarin - Pharmacist Dosing Inpatient   Does not apply q1600  . zinc sulfate  220 mg Oral Daily    Infusions: . sodium chloride Stopped (12/30/19 1923)  . furosemide (LASIX) infusion 20 mg/hr (12/31/19 0600)  . milrinone 0.25 mcg/kg/min (12/31/19 0600)  . norepinephrine (LEVOPHED) Adult infusion 2.667 mcg/min (12/30/19 2000)    PRN Medications: sodium chloride, acetaminophen, albuterol, hydrOXYzine, sodium chloride flush, traMADol  Assessment/Plan:    1. Lactic Acidosis likely due to cardiogenic shock - now resolved. 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 NE added - Ramp echo on 8/13 with severe RV dysfunction. AoV opening every beat. Speed increased 5600-> 6200. Tolerating well.  - Norepi stopped 8/14. Maps stable. CO-OX trending down 65>59>49%. Repeat now. Continue milrinone at current dose.  - CVP trending down to 16. Continue lasix drip today. Weight down another 6 pounds.  - on sildenafil 20 tid and digoxin 0.125 for RV failure.  3.  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 Evaluated by TCTS and IR and found to be muscle flap with no draiable fluid collection 6/21 - Site looks ok today. CT of chest unremarkable, AF. - BCx negative - ID following. Was on  unasyn but switch back to Keflex on 8/15 -He is nota candidate for pump exchange given severe RV dysfunction and poor self-care  4. HM3 LVAD 09/06/2017. - Speed increased 5600 -> 6200 on 8/13 - LDH stable 282  - INR goal 2.0-2.5.  - INR 1.5  Continue warfarin Discussed dosing with PharmD  personally. - Off ASA with PUD.  5.Essential HTN - MAPs ok. Continue to hold hydral and spiro    6. OSA - sleep study with very severe OSA (AHI 69/hr) -  Able to use CPAP last nigt.  - Needsf/u in sleep clinic. Arranged several times but he has not been compliant   7. H/o GI bleed  - 08/2018 EGD completed and showed gastritis and nonbleeding duodenal ulcers, colonoscopy with 5 polyps removed. Protonix was increased to BID.  - hgb down from 9.9>>7.4. Received RBCs - hgb stable at 9.7.  - He will stay off aspirin for now.   8. AKI - Scr 1.99 (1.4 on admit). Creatinine today 1.6.  - 2/2 HF and volume overload - continue milrinone and NE. IV Lasix for diuresis - follow BMP   9. Hypokalemia - K 4 today, stable.   10. Hypomag - Mag I.5  - Give 4 grams mag   Move to [facility] . Prefer not to send home on inotropes with h/o noncompliance.   Addendum Repeat CO-OX 46%. Will need to add back norepi 3 mcg. Plan to continue until he is diuresed. Repeat CO-OX at 1400 today.   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 Diuresing with lasix gtt but co-ox back down after stopping NE. Renal function stable.   General:  NAD.  HEENT: normal  Neck: supple. JVP to earCarotids 2+ bilat; no bruits. No lymphadenopathy or thryomegaly appreciated. Cor: LVAD hum.  Lungs: Clear. Abdomen: obese soft, nontender, + distended. No hepatosplenomegaly. No bruits or masses. Good bowel sounds. Driveline site clean. Anchor in place.  Extremities: no cyanosis, clubbing, rash. Warm 2+ edema  Neuro: alert & oriented x 3. No focal deficits. Moves all 4 without problem   Will restart NE to support further  diuresis in setting of end-stage RV failure. Will continue to optimize as much as possible but I worry he may not be able to come off of inotropes and he is not a candidate for transplant or home inotrope support. Can increase VAD sped as toelrated. INR 1.5. Start heparin low dose.  CRITICAL CARE Performed by: Arvilla Meres  Total critical care time: 35 minutes  Critical care time was exclusive of separately billable procedures and treating other patients.  Critical care was necessary to treat or prevent imminent or life-threatening deterioration.  Critical care was time spent personally by me (independent of midlevel providers or residents) on the following activities: development of treatment plan with patient and/or surrogate as well as nursing, discussions with consultants, evaluation of patient's response to treatment, examination of patient, obtaining history from patient or surrogate, ordering and performing treatments and interventions, ordering and review of laboratory studies, ordering and review of radiographic studies, pulse oximetry and re-evaluation of patient's condition.  Arvilla Meres, MD  5:21 PM

## 2019-12-31 NOTE — Progress Notes (Signed)
LVAD Coordinator Rounding Note:  Admitted 12/28/19 by Dr [name] for severe left upper chest/ shoulder pain, tachycardia, and tachypnea. Lactic acid >11.   STAT CT of C/A/P negative for dissection. The subcutaneous fluid collection superficial to the sternum is not substantially changed from previous study. No acute abdominal process.   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 Laneta Simmers under Destination Therapy criteria.   Pt up in the chair today. States that he feels much better.  Coox 46 today - on Milrinone 0.25 mcg/kg/min. And Levophed 3 mcg/min   Pt did not bring back up equipment. Loaner back up bag provided to pt.   Vital signs: Temp: 97.7 HR: 109 Auto BP: 91/74 (82) Doppler: 78 Sat: 98% on RA Wt: 233.9>220.2 lbs  LVAD interrogation reveals:  Speed: 6200 Flow: 5.5 Power:  5.1w PI: 2.7  Alarms: none Events: none Hematocrit: 31  Fixed speed: 6200  Low speed limit: 5900  Drive Line:  Left abdominal drive line dressing C/D/I with anchor intact. Weekly dressing changes per BS nurse. Dressing change due 01/03/20.   Labs:  LDH trend: 252>282  INR trend: 3.0>1.5  WBC: 11.3>13.7>11.8  Lactic Acid: greater than 11 > 2.3  Drips: Milrinone 0.25 mcg/kg/min Levophed 3 mcg/min  Infection: - 12/27/19 blood cx>> no growth 4 days  Blood Products: -12/28/19> 2 PRBCs  Anticoagulation Plan: -INR Goal: 2.0 - 2.5  -ASA Dose: none  Adverse Events: - driveline cx 04/23/19>> staph aureus - Admitted for sternal/drive line infection. OR for debridement 08/20/19- + staph aureus wound cultures; further irrigation/debridements done 08/22/19, 08/24/19, 08/30/19, and 09/03/19. Debridement and closure of sternal wound with muscle flap 09/05/19.  - Admitted 11/13/19 for respiratory distress and wound management of subxiphoid abscess. UDS + THC & benzos.  - Admitted 12/27/19 severe left chest/shoulder pain. Profound right heart failure   Plan/Recommendations:  1. Call VAD pager if any VAD  equipment or drive line issues. 2. Weekly drive line dressing changes by bedside nurse.   3. Loaner back up equipment at bedside.  Carlton Adam RN VAD Coordinator  Office: [phone]  24/7 Pager: [phone]

## 2019-12-31 NOTE — Progress Notes (Signed)
ANTICOAGULATION CONSULT NOTE - Follow Up Consult  Pharmacy Consult for warfarin Indication: LVAD  No Known Allergies  Patient Measurements: Height: 6\' 2"  (188 cm) Weight: 99.9 kg (220 lb 3.8 oz) IBW/kg (Calculated) : 82.2 Heparin Dosing Weight: 98.9 kg   Vital Signs: Temp: 97.7 F (36.5 C) (08/16 1120) Temp Source: Oral (08/16 1120) BP: 136/119 (08/16 1300) Pulse Rate: 94 (08/16 1300)  Labs: Recent Labs    12/29/19 0420 12/29/19 0420 12/30/19 0325 12/30/19 1410 12/31/19 0337  HGB 9.0*   < > 9.3*  --  9.7*  HCT 28.5*  --  30.2*  --  30.9*  PLT 149*  --  144*  --  147*  LABPROT 24.5*  --  19.5*  --  17.4*  INR 2.3*  --  1.7*  --  1.5*  CREATININE 1.99*   < > 1.68* 1.37* 1.61*   < > = values in this interval not displayed.    Estimated Creatinine Clearance: 72.4 mL/min (A) (by C-G formula based on SCr of 1.61 mg/dL (H)).   Medical History: Past Medical History:  Diagnosis Date  . Asthma   . CHF (congestive heart failure) (HCC)    a. 09/2016: EF 20-25% with cath showing normal cors  . GERD (gastroesophageal reflux disease)   . History of hiatal hernia   . LVAD (left ventricular assist device) present (HCC)   . OSA on CPAP 09/06/2018   Severe OSA with AHI 68/hr on CPAP at 12cm H2O    Medications:  Scheduled:  . (feeding supplement) PROSource Plus  30 mL Oral BID BM  . cephALEXin  500 mg Oral Q8H  . Chlorhexidine Gluconate Cloth  6 each Topical Daily  . digoxin  0.125 mg Oral Daily  . docusate sodium  100 mg Oral BID  . DULoxetine  60 mg Oral Daily  . gabapentin  300 mg Oral TID  . insulin aspart  0-6 Units Subcutaneous TID AC & HS  . multivitamin with minerals  1 tablet Oral Daily  . pantoprazole  40 mg Oral BID  . sildenafil  20 mg Oral TID  . sodium chloride flush  10-40 mL Intracatheter Q12H  . warfarin  5 mg Oral ONCE-1600  . Warfarin - Pharmacist Dosing Inpatient   Does not apply q1600  . zinc sulfate  220 mg Oral Daily    Assessment: 48 yom  presenting with upper wall chest pain - has hx of muscle flap wound infection growing MSSA in past followed by ID. On warfarin for hx LVAD HM3 (LD confirmed with patient on 8/10).  PTA regimen: 2.5 mg daily except 5 mg MWF  INR now subtherapeutic at 1.5 after restarting warfarin. Hgb stable at 9.7, received 2 units PRBC 8/13. Plts down to 147. LDH stable 282. No overt bleeding.  Goal of Therapy:  INR 2-2.5 Monitor platelets by anticoagulation protocol: Yes   Plan:  Warfarin 5 mg PO tonight x1 Start IV Heparin this afternoon.  Per discussion with Dr. 9/13, will start at 600 units/hr, do not titrate. Monitor daily INR, CBC, and for s/sx of bleeding   [name], Reece Leader, [facility] Clinical Pharmacist  12/31/2019 3:04 PM   [facility] pharmacy phone numbers are listed on [email or web address]

## 2019-12-31 NOTE — Progress Notes (Signed)
CSW met at bedside with patient to remind him of his virtual visit tomorrow at 1pm with the psych MD. Patient verbalizes understanding and CSW will bring laptop to bedside for visit to take place. Patient grateful for the support. [name], [place], [place]

## 2020-01-01 ENCOUNTER — Ambulatory Visit (INDEPENDENT_AMBULATORY_CARE_PROVIDER_SITE_OTHER): Payer: Medicare Other | Admitting: Psychiatry

## 2020-01-01 ENCOUNTER — Encounter (HOSPITAL_COMMUNITY): Payer: Self-pay | Admitting: Psychiatry

## 2020-01-01 DIAGNOSIS — I50810 Right heart failure, unspecified: Secondary | ICD-10-CM

## 2020-01-01 DIAGNOSIS — F411 Generalized anxiety disorder: Secondary | ICD-10-CM

## 2020-01-01 DIAGNOSIS — F1021 Alcohol dependence, in remission: Secondary | ICD-10-CM | POA: Diagnosis not present

## 2020-01-01 LAB — BASIC METABOLIC PANEL
Anion gap: 11 (ref 5–15)
BUN: 22 mg/dL — ABNORMAL HIGH (ref 6–20)
CO2: 32 mmol/L (ref 22–32)
Calcium: 9.1 mg/dL (ref 8.9–10.3)
Chloride: 88 mmol/L — ABNORMAL LOW (ref 98–111)
Creatinine, Ser: 1.5 mg/dL — ABNORMAL HIGH (ref 0.61–1.24)
GFR calc Af Amer: >60 mL/min (ref >60)
GFR calc non Af Amer: 55 mL/min — ABNORMAL LOW (ref >60)
Glucose, Bld: 112 mg/dL — ABNORMAL HIGH (ref 70–99)
Potassium: 4.3 mmol/L (ref 3.5–5.1)
Sodium: 131 mmol/L — ABNORMAL LOW (ref 135–145)

## 2020-01-01 LAB — CBC
HCT: 31.5 % — ABNORMAL LOW (ref 39.0–52.0)
Hemoglobin: 9.6 g/dL — ABNORMAL LOW (ref 13.0–17.0)
MCH: 24.7 pg — ABNORMAL LOW (ref 26.0–34.0)
MCHC: 30.5 g/dL (ref 30.0–36.0)
MCV: 81.2 fL (ref 80.0–100.0)
Platelets: 152 10*3/uL (ref 150–400)
RBC: 3.88 MIL/uL — ABNORMAL LOW (ref 4.22–5.81)
RDW: 19.6 % — ABNORMAL HIGH (ref 11.5–15.5)
WBC: 12.2 10*3/uL — ABNORMAL HIGH (ref 4.0–10.5)
nRBC: 0.2 % (ref 0.0–0.2)

## 2020-01-01 LAB — CULTURE, BLOOD (ROUTINE X 2)
Culture: NO GROWTH
Culture: NO GROWTH
Special Requests: ADEQUATE

## 2020-01-01 LAB — HEPARIN LEVEL (UNFRACTIONATED): Heparin Unfractionated: <0.1 IU/mL — ABNORMAL LOW (ref 0.30–0.70)

## 2020-01-01 LAB — LACTATE DEHYDROGENASE: LDH: 305 U/L — ABNORMAL HIGH (ref 98–192)

## 2020-01-01 LAB — COOXEMETRY PANEL
Carboxyhemoglobin: 2.2 % — ABNORMAL HIGH (ref 0.5–1.5)
Methemoglobin: 1.3 % (ref 0.0–1.5)
O2 Saturation: 54.1 %
Total hemoglobin: 10.7 g/dL — ABNORMAL LOW (ref 12.0–16.0)

## 2020-01-01 LAB — GLUCOSE, CAPILLARY
Glucose-Capillary: 119 mg/dL — ABNORMAL HIGH (ref 70–99)
Glucose-Capillary: 169 mg/dL — ABNORMAL HIGH (ref 70–99)
Glucose-Capillary: 172 mg/dL — ABNORMAL HIGH (ref 70–99)
Glucose-Capillary: 178 mg/dL — ABNORMAL HIGH (ref 70–99)

## 2020-01-01 LAB — PROTIME-INR
INR: 1.4 — ABNORMAL HIGH (ref 0.8–1.2)
Prothrombin Time: 16.6 seconds — ABNORMAL HIGH (ref 11.4–15.2)

## 2020-01-01 LAB — MAGNESIUM: Magnesium: 1.8 mg/dL (ref 1.7–2.4)

## 2020-01-01 MED ORDER — ESCITALOPRAM OXALATE 10 MG PO TABS: 10.0000 mg | ORAL_TABLET | Freq: Every day | ORAL | 1 refills | Status: DC

## 2020-01-01 MED ORDER — HYDROXYZINE HCL 50 MG PO TABS: 50.0000 mg | ORAL_TABLET | Freq: Every evening | ORAL | 1 refills | Status: DC | PRN

## 2020-01-01 MED ORDER — WARFARIN SODIUM 3 MG PO TABS
6.0000 mg | ORAL_TABLET | Freq: Once | ORAL | Status: AC
  Administered 2020-01-01: 6 mg via ORAL
  Filled 2020-01-01: qty 2

## 2020-01-01 NOTE — Progress Notes (Signed)
ANTICOAGULATION CONSULT NOTE - Follow Up Consult  Pharmacy Consult for warfarin/heparin Indication: LVAD  No Known Allergies  Patient Measurements: Height: 6\' 2"  (188 cm) Weight: 95.3 kg (210 lb 1.6 oz) (HM3 controller and cords held by RN) IBW/kg (Calculated) : 82.2 Heparin Dosing Weight: 98.9 kg   Vital Signs: Temp: 97.8 F (36.6 C) (08/17 1140) Temp Source: Oral (08/17 1140) BP: 84/62 (08/17 0400) Pulse Rate: 103 (08/17 1200)  Labs: Recent Labs    12/30/19 0325 12/30/19 0325 12/30/19 1410 12/31/19 0337 12/31/19 2135 01/01/20 0423  HGB 9.3*   < >  --  9.7*  --  9.6*  HCT 30.2*  --   --  30.9*  --  31.5*  PLT 144*  --   --  147*  --  152  LABPROT 19.5*  --   --  17.4*  --  16.6*  INR 1.7*  --   --  1.5*  --  1.4*  HEPARINUNFRC  --   --   --   --  <0.10* <0.10*  CREATININE 1.68*   < > 1.37* 1.61*  --  1.50*   < > = values in this interval not displayed.    Estimated Creatinine Clearance: 71.5 mL/min (A) (by C-G formula based on SCr of 1.5 mg/dL (H)).   Medical History: Past Medical History:  Diagnosis Date  . Asthma   . CHF (congestive heart failure) (HCC)    a. 09/2016: EF 20-25% with cath showing normal cors  . GERD (gastroesophageal reflux disease)   . History of hiatal hernia   . LVAD (left ventricular assist device) present (HCC)   . OSA on CPAP 09/06/2018   Severe OSA with AHI 68/hr on CPAP at 12cm H2O    Medications:  Scheduled:  . (feeding supplement) PROSource Plus  30 mL Oral BID BM  . cephALEXin  500 mg Oral Q8H  . Chlorhexidine Gluconate Cloth  6 each Topical Daily  . digoxin  0.125 mg Oral Daily  . docusate sodium  100 mg Oral BID  . DULoxetine  60 mg Oral Daily  . gabapentin  300 mg Oral TID  . insulin aspart  0-6 Units Subcutaneous TID AC & HS  . multivitamin with minerals  1 tablet Oral Daily  . pantoprazole  40 mg Oral BID  . sildenafil  20 mg Oral TID  . sodium chloride flush  10-40 mL Intracatheter Q12H  . warfarin  6 mg Oral  ONCE-1600  . Warfarin - Pharmacist Dosing Inpatient   Does not apply q1600  . zinc sulfate  220 mg Oral Daily    Assessment: 48 yom presenting with upper wall chest pain - has hx of muscle flap wound infection growing MSSA in past followed by ID. On warfarin for hx LVAD HM3 (LD confirmed with patient on 8/10).  PTA regimen: 2.5 mg daily except 5 mg MWF  INR now subtherapeutic at 1.4 after restarting warfarin. Hgb stable at 9.6, received 2 units PRBC 8/13. Plts stable 152. LDH stable. No overt bleeding.   Heparin level undetectable as expected on low-dose heparin drip while INR subtherapeutic.  Goal of Therapy:  INR 2-2.5 Monitor platelets by anticoagulation protocol: Yes   Plan:  Warfarin 6 mg PO tonight x1 On IV heparin at 600 units/hr, not planning to titrate. Monitor daily INR, CBC, and for s/sx of bleeding   9/13, Reece Leader, [facility] Clinical Pharmacist  01/01/2020 1:25 PM   [facility] pharmacy phone numbers are listed on  [email or web address]

## 2020-01-01 NOTE — Progress Notes (Signed)
Psychiatric Initial Adult Assessment   Patient Identification: Jonathan Wood MRN:  [id number] Date of Evaluation:  01/01/2020 Referral Source: Merryl Hacker Chief Complaint:   Chief Complaint    Establish Care; Anxiety     Interview was conducted using videoconferencing application and I verified that I was speaking with the correct person using two identifiers. I discussed the limitations of evaluation and management by telemedicine and  the availability of in person appointments. Patient expressed understanding and agreed to proceed. Patient location - hospital; physician - home office.  Visit Diagnosis:    ICD-10-CM   1. Generalized anxiety disorder  F41.1   2. Alcohol use disorder, moderate, in early remission (HCC)  F10.21     History of Present Illness:  Jonathan Wood is a 49 yo separated [name] referred to [place] by his cardiology providers for treatment of anxiety/insomnia. He admittedly has been struggling with excessive worrying and initial and middle insomnia for a few years. He worries about his health, his children, ruminates, feels restless/cannot relax, has racing thoughts which interfere with his ability to fall asleep. He would also often wake up after few hours and cannot go back to sleep. His anxiety worsened at the time when his wife left him. He was also depressed and passively suicidal then. He started to drink alcohol heavily - daily liquor with episodic blackouts. He later drank beer and in early April stopped and has been sober ever since. In the past he also abused marijuana (helped with anxiety). He also admits using Xanax "off the streets" in the past to help with insomnia. At the time of separation he want through counseling. He was not prescribed any psychotropic medications at that time. He has OSA but refused to use bipap, DM with neuropathy, GERD. He is currently in the hospital because of drive line infection/cellulitis. In July 2019 Dr. Pamella Pert started  him on 10 mg of citalopram for anxiety and per progress notes patient reported anxiety to decrease. He is telling me today that it did not and he would at times have visual hallucinations which he believes were related to that medication. In April this year citalopram was changed to duloxetine 60 mg which patient describes as ineffective and causing him to feel "spaced out". It did not seem to cause VH though. He has been prescribed few medications for insomnia: trazodone 50 mg, 10 mg and hydroxyzine 25 mg. He did not find them to be effective and is asking if he could be prescribed Xanax instead! Jonathan Wood just started individual counseling.  Patient has no hx of mania, no inpatient psychiatric admissions. Family hx significant for two maternal cousins having alcohol drinking problem.  Associated Signs/Symptoms: Depression Symptoms:  anxiety, loss of energy/fatigue, disturbed sleep, (Hypo) Manic Symptoms:  None Anxiety Symptoms:  Excessive Worry, Psychotic Symptoms:  None. PTSD Symptoms: Negative  Past Psychiatric History: see above  Previous Psychotropic Medications: Yes   Substance Abuse History in the last 12 months:  Yes.    Consequences of Substance Abuse: Blackouts:  in the past  Past Medical History:  Past Medical History:  Diagnosis Date  . Asthma   . CHF (congestive heart failure) (HCC)    a. 09/2016: EF 20-25% with cath showing normal cors  . GERD (gastroesophageal reflux disease)   . History of hiatal hernia   . LVAD (left ventricular assist device) present (HCC)   . OSA on CPAP 09/06/2018   Severe OSA with AHI 68/hr on CPAP at 12cm H2O  Past Surgical History:  Procedure Laterality Date  . APPLICATION OF A-CELL OF CHEST/ABDOMEN N/A 08/24/2019   Procedure: Application Of A-Cell Of Chest/Abdomen;  Surgeon: Kerin Perna, MD;  Location: [facility] OR;  Service: Thoracic;  Laterality: N/A;  . APPLICATION OF A-CELL OF CHEST/ABDOMEN N/A 09/05/2019   Procedure: Application Of  A-Cell Of Chest/Abdomen;  Surgeon: Peggye Form, DO;  Location: [facility] OR;  Service: Plastics;  Laterality: N/A;  . APPLICATION OF A-CELL OF CHEST/ABDOMEN  09/03/2019   Procedure: Application Of A-Cell Of Chest/Abdomen;  Surgeon: Kerin Perna, MD;  Location: [facility] OR;  Service: Thoracic;;  . APPLICATION OF WOUND VAC N/A 08/22/2019   Procedure: Debridement, Irrigation and Packing of Abdominal Incision.;  Surgeon: Kerin Perna, MD;  Location: [facility] OR;  Service: Thoracic;  Laterality: N/A;  . APPLICATION OF WOUND VAC N/A 08/24/2019   Procedure: Irrigation and Debridement with WOUND VAC APPLICATION;  Surgeon: Kerin Perna, MD;  Location: [facility] OR;  Service: Thoracic;  Laterality: N/A;  . APPLICATION OF WOUND VAC N/A 08/30/2019   Procedure: APPLICATION OF WOUND VAC;  Surgeon: Kerin Perna, MD;  Location: [facility] OR;  Service: Thoracic;  Laterality: N/A;  . APPLICATION OF WOUND VAC N/A 09/03/2019   Procedure: WOUND VAC CHANGE;  Surgeon: Kerin Perna, MD;  Location: [facility] OR;  Service: Thoracic;  Laterality: N/A;  . BIOPSY  08/23/2018   Procedure: BIOPSY;  Surgeon: Lemar Lofty., MD;  Location: [facility] ENDOSCOPY;  Service: Gastroenterology;;  . COLONOSCOPY N/A 08/23/2018   Procedure: COLONOSCOPY;  Surgeon: Lemar Lofty., MD;  Location: [facility] ENDOSCOPY;  Service: Gastroenterology;  Laterality: N/A;  . ENTEROSCOPY N/A 08/23/2018   Procedure: ENTEROSCOPY;  Surgeon: Meridee Score Netty Starring., MD;  Location: [facility] ENDOSCOPY;  Service: Gastroenterology;  Laterality: N/A;  . HEMOSTASIS CLIP PLACEMENT  08/23/2018   Procedure: HEMOSTASIS CLIP PLACEMENT;  Surgeon: Lemar Lofty., MD;  Location: [facility] ENDOSCOPY;  Service: Gastroenterology;;  . IABP INSERTION N/A 09/05/2017   Procedure: IABP INSERTION;  Surgeon: Dolores Patty, MD;  Location: [facility] INVASIVE CV LAB;  Service: Cardiovascular;  Laterality: N/A;  . INSERTION OF IMPLANTABLE LEFT VENTRICULAR ASSIST DEVICE N/A 09/06/2017   Procedure: INSERTION OF  IMPLANTABLE LEFT VENTRICULAR ASSIST DEVICE - HM3;  Surgeon: Kerin Perna, MD;  Location: [facility] OR;  Service: Open Heart Surgery;  Laterality: N/A;  HM3  . MUSCLE FLAP CLOSURE N/A 09/05/2019   Procedure: MUSCLE FLAP CLOSURE;  Surgeon: Peggye Form, DO;  Location: [facility] OR;  Service: Plastics;  Laterality: N/A;  . NASAL FRACTURE SURGERY  1987  . POLYPECTOMY  08/23/2018   Procedure: POLYPECTOMY;  Surgeon: Mansouraty, Netty Starring., MD;  Location: [facility] ENDOSCOPY;  Service: Gastroenterology;;  . RIGHT HEART CATH N/A 08/30/2017   Procedure: RIGHT HEART CATH;  Surgeon: Dolores Patty, MD;  Location: [facility] INVASIVE CV LAB;  Service: Cardiovascular;  Laterality: N/A;  . RIGHT/LEFT HEART CATH AND CORONARY ANGIOGRAPHY N/A 10/08/2016   Procedure: Right/Left Heart Cath and Coronary Angiography;  Surgeon: Orpah Cobb, MD;  Location: [facility] INVASIVE CV LAB;  Service: Cardiovascular;  Laterality: N/A;  . STERNAL WOUND DEBRIDEMENT N/A 08/20/2019   Procedure: DEBRIDEMENT OF LVAD DRIVELINE TUNNEL;  Surgeon: Kerin Perna, MD;  Location: [facility] OR;  Service: Thoracic;  Laterality: N/A;  . STERNAL WOUND DEBRIDEMENT N/A 09/05/2019   Procedure: DEBRIDEMENT AND CLOSURE OF ABDOMINAL WOUND;  Surgeon: Peggye Form, DO;  Location: [facility] OR;  Service: Plastics;  Laterality: N/A;  . SUBMUCOSAL TATTOO INJECTION  08/23/2018  Procedure: SUBMUCOSAL TATTOO INJECTION;  Surgeon: Lemar Lofty., MD;  Location: [facility] ENDOSCOPY;  Service: Gastroenterology;;  . TEE WITHOUT CARDIOVERSION N/A 09/06/2017   Procedure: TRANSESOPHAGEAL ECHOCARDIOGRAM (TEE);  Surgeon: Donata Clay, Theron Arista, MD;  Location: [facility] OR;  Service: Open Heart Surgery;  Laterality: N/A;  . WOUND DEBRIDEMENT N/A 08/30/2019   Procedure: Debridement Abdominal Wound;  Surgeon: Kerin Perna, MD;  Location: [facility] OR;  Service: Thoracic;  Laterality: N/A;    Family Psychiatric History: Reviewed.  Family History:  Family History  Problem Relation Age of Onset  . Hypertension  Father   . Alcohol abuse Cousin   . Colon cancer Neg Hx   . Esophageal cancer Neg Hx   . Inflammatory bowel disease Neg Hx   . Liver disease Neg Hx   . Pancreatic cancer Neg Hx   . Rectal cancer Neg Hx   . Stomach cancer Neg Hx   . Diabetes Neg Hx     Social History:   Social History   Socioeconomic History  . Marital status: Legally Separated    Spouse name: Not on file  . Number of children: 3  . Years of education: Not on file  . Highest education level: Not on file  Occupational History  . Not on file  Tobacco Use  . Smoking status: Former Smoker    Packs/day: 0.50    Years: 25.00    Pack years: 12.50    Types: Cigarettes  . Smokeless tobacco: Never Used  . Tobacco comment: quit 08-16-2019  Vaping Use  . Vaping Use: Never used  Substance and Sexual Activity  . Alcohol use: Not Currently    Comment: stopped drinking in April 2021  . Drug use: Not Currently    Types: Marijuana  . Sexual activity: Yes  Other Topics Concern  . Not on file  Social History Narrative   Lives at home with his three children (8, 15, and 41 years of age).    Just approved for SS disability.   Social Determinants of Health   Financial Resource Strain:   . Difficulty of Paying Living Expenses:   Food Insecurity:   . Worried About Programme researcher, broadcasting/film/video in the Last Year:   . Barista in the Last Year:   Transportation Needs:   . Freight forwarder (Medical):   Marland Kitchen Lack of Transportation (Non-Medical):   Physical Activity:   . Days of Exercise per Week:   . Minutes of Exercise per Session:   Stress:   . Feeling of Stress :   Social Connections:   . Frequency of Communication with Friends and Family:   . Frequency of Social Gatherings with Friends and Family:   . Attends Religious Services:   . Active Member of Clubs or Organizations:   . Attends Banker Meetings:   Marland Kitchen Marital Status:     Allergies:  No Known Allergies  Metabolic Disorder Labs: Lab Results   Component Value Date   HGBA1C 5.4 12/28/2019   MPG 108.28 12/28/2019   MPG 102.54 08/30/2017   No results found for: PROLACTIN Lab Results  Component Value Date   CHOL 111 08/30/2017   TRIG 38 08/30/2017   HDL 33 (L) 08/30/2017   CHOLHDL 3.4 08/30/2017   VLDL 8 08/30/2017   LDLCALC 70 08/30/2017   Lab Results  Component Value Date   TSH 0.209 (L) 08/13/2019    Therapeutic Level Labs: No results found for: LITHIUM No results found for:  CBMZ No results found for: VALPROATE  Current Medications: No current facility-administered medications for this visit.   Current Outpatient Medications  Medication Sig Dispense Refill  . escitalopram (LEXAPRO) 10 MG tablet Take 1 tablet (10 mg total) by mouth daily. 30 tablet 1  . hydrOXYzine (ATARAX/VISTARIL) 50 MG tablet Take 1 tablet (50 mg total) by mouth at bedtime and may repeat dose one time if needed. 60 tablet 1   Facility-Administered Medications Ordered in Other Visits  Medication Dose Route Frequency Provider Last Rate Last Admin  . (feeding supplement) PROSource Plus liquid 30 mL  30 mL Oral BID BM Bensimhon, Bevelyn Buckles, MD      . 0.9 %  sodium chloride infusion   Intravenous PRN Bensimhon, Bevelyn Buckles, MD   Stopped at 12/30/19 1923  . acetaminophen (TYLENOL) tablet 650 mg  650 mg Oral Q4H PRN Allayne Butcher, PA-C   650 mg at 01/01/20 1035  . albuterol (PROVENTIL) (2.5 MG/3ML) 0.083% nebulizer solution 2.5 mg  2.5 mg Nebulization Q6H PRN Bensimhon, Bevelyn Buckles, MD      . cephALEXin (KEFLEX) capsule 500 mg  500 mg Oral Q8H Bensimhon, Bevelyn Buckles, MD   500 mg at 01/01/20 9191  . Chlorhexidine Gluconate Cloth 2 % PADS 6 each  6 each Topical Daily Bensimhon, Bevelyn Buckles, MD   6 each at 12/31/19 [phone]  . digoxin (LANOXIN) tablet 0.125 mg  0.125 mg Oral Daily Bensimhon, Bevelyn Buckles, MD   0.125 mg at 01/01/20 1035  . docusate sodium (COLACE) capsule 100 mg  100 mg Oral BID Robbie Lis M, PA-C   100 mg at 12/31/19 0045  . DULoxetine  (CYMBALTA) DR capsule 60 mg  60 mg Oral Daily Robbie Lis M, PA-C   60 mg at 01/01/20 1035  . furosemide (LASIX) 250 mg in dextrose 5 % 250 mL (1 mg/mL) infusion  20 mg/hr Intravenous Continuous Bensimhon, Bevelyn Buckles, MD 20 mL/hr at 01/01/20 0700 20 mg/hr at 01/01/20 0700  . gabapentin (NEURONTIN) capsule 300 mg  300 mg Oral TID Robbie Lis M, PA-C   300 mg at 01/01/20 1035  . heparin ADULT infusion 100 units/mL (25000 units/212mL sodium chloride 0.45%)  600 Units/hr Intravenous Continuous Carney, Jessica C, RPH 6 mL/hr at 01/01/20 0700 600 Units/hr at 01/01/20 0700  . hydrOXYzine (ATARAX/VISTARIL) tablet 25 mg  25 mg Oral QHS PRN Robbie Lis M, PA-C   25 mg at 12/30/19 2200  . insulin aspart (novoLOG) injection 0-6 Units  0-6 Units Subcutaneous TID AC & HS Bensimhon, Bevelyn Buckles, MD   1 Units at 01/01/20 1216  . milrinone (PRIMACOR) 20 MG/100 ML (0.2 mg/mL) infusion  0.25 mcg/kg/min Intravenous Continuous Karie Fetch P, DO 7.42 mL/hr at 01/01/20 0700 0.25 mcg/kg/min at 01/01/20 0700  . multivitamin with minerals tablet 1 tablet  1 tablet Oral Daily Bensimhon, Bevelyn Buckles, MD   1 tablet at 01/01/20 1035  . norepinephrine (LEVOPHED) 4mg  in premix infusion  3 mcg/min Intravenous Titrated Clegg, Amy D, NP 11.25 mL/hr at 01/01/20 0834 3 mcg/min at 01/01/20 0834  . pantoprazole (PROTONIX) EC tablet 40 mg  40 mg Oral BID 01/03/20 M, PA-C   40 mg at 01/01/20 1035  . sildenafil (REVATIO) tablet 20 mg  20 mg Oral TID 01/03/20 M, PA-C   20 mg at 01/01/20 1035  . sodium chloride flush (NS) 0.9 % injection 10-40 mL  10-40 mL Intracatheter Q12H Bensimhon, 11-06-1994, MD   10 mL at 12/31/19 2101  .  sodium chloride flush (NS) 0.9 % injection 10-40 mL  10-40 mL Intracatheter PRN Bensimhon, Bevelyn Buckles, MD      . traMADol Janean Sark) tablet 100 mg  100 mg Oral Q6H PRN Bensimhon, Bevelyn Buckles, MD   100 mg at 12/31/19 2343  . warfarin (COUMADIN) tablet 6 mg  6 mg Oral ONCE-1600 Carney, Gwenlyn Found, RPH      . Warfarin - Pharmacist Dosing Inpatient   Does not apply q1600 Bensimhon, Bevelyn Buckles, MD   Given at 12/29/19 1643  . zinc sulfate capsule 220 mg  220 mg Oral Daily Robbie Lis M, PA-C   220 mg at 01/01/20 1035      Psychiatric Specialty Exam: Review of Systems  Psychiatric/Behavioral: Positive for sleep disturbance. The patient is nervous/anxious.   All other systems reviewed and are negative.   There were no vitals taken for this visit.There is no height or weight on file to calculate BMI.  General Appearance: In hospital attire  Eye Contact:  Fair  Speech:  Clear and Coherent and Normal Rate  Volume:  Normal  Mood:  Anxious  Affect:  Congruent and Constricted  Thought Process:  Goal Directed  Orientation:  Full (Time, Place, and Person)  Thought Content:  Rumination  Suicidal Thoughts:  No  Homicidal Thoughts:  No  Memory:  Immediate;   Good Recent;   Good Remote;   Good  Judgement:  Fair  Insight:  Fair  Psychomotor Activity:  Normal  Concentration:  Concentration: Good  Recall:  Fair  Fund of Knowledge:Fair  Language: Good  Akathisia:  Negative  Handed:  Right  AIMS (if indicated):  not done  Assets:  Communication Skills Desire for Improvement Housing Resilience  ADL's:  Intact  Cognition: WNL  Sleep:  Poor   Screenings: GAD-7     Telemedicine from 09/25/2019 in [facility]  Total GAD-7 Score 3    PHQ2-9     Telemedicine from 09/25/2019 in [facility]  PHQ-2 Total Score 2  PHQ-9 Total Score 9      Assessment and Plan: 49 yo separated [name] referred to [place] by his cardiology providers for treatment of anxiety/insomnia. He admittedly has been struggling with excessive worrying and initial and middle insomnia for a few years. He worries about his health, his children, ruminates, has racing thoughts which interfere with his ability to fall asleep. He would also often wake up after few  hours and cannot go back to sleep. His anxiety worsened at the time when his wife left him. He was also depressed and passively suicidal then. He started to drink alcohol heavily - daily liquor with episodic blackouts. He later drank beer and in early April stopped and has been sober ever since. In the past he also abused marijuana (helped with anxiety). He also admits using Xanax "off the streets" in the past to help with insomnia. At the time of separation he want through counseling. He was not prescribed any psychotropic medications at that time. H ehas a hx of multiple medical problems including CHF with LVAD implanted, OSA (refused to use bipap), DM with neuropathy, GERD. He is currently in the hospital because of drive line infection/cellulitis. In July 2019 Dr. Pamella Pert started him on 10 mg of citalopram for anxiety and per progress notes patient reported anxiety to decrease. He is telling me today that it did not and he would at times have visual hallucinations which he believes were related  to that medication. In April this year citalopram was changed to duloxetine 60 mg which patient describes as ineffective and causing him to feel "spaced out". It did not seem to cause VH though. He has been prescribed few medications for insomnia: trazodone 50 mg, 10 mg and hydroxyzine 25 mg. He did not find them to be effective and is asking if he could be prescribed Xanax instead! Jonathan Wood just started individual counseling.  Patient has no hx of mania, no inpatient psychiatric admissions. Family hx significant for two maternal cousins having alcohol drinking problem.  Dx: GAD; Alcohol use disorder in early remission  Plan: We will try a combination of  Lexapro 10 mg daily for anxiety and higher dose of hydroxyzine (50 mg) for insomnia. He is on warfarin 6 mg and his INR is being monitored (possibility of escitalopram interaction with warfarin). I hesitate to prescribe alprazolam for an individual with hx of  alcohol addiction. If hydroxyzine does not help with sleep we could try zolpidem CR, eszopiclone or suvorexant. Next appointment in one month. The plan was discussed with patient who had an opportunity to ask questions and these were all answered. I spend 40 minutes in video clinical contact with the patient and devoted approximately 50% of this time to explanation of diagnosis, discussion of treatment options and med education.   Magdalene Patricia, MD 8/17/20211:30 PM

## 2020-01-01 NOTE — Progress Notes (Addendum)
Advanced Heart Failure VAD Team Note  PCP-Cardiologist: Arvilla Meres, MD   Subjective:    - Ramp echo 8/13 with severe RV dysfunction Speed 5600-> 6200 - On 8/16 norepi restarted due to low CO-OX. Continued on milrinone 0.25 mcg + lasix drip.   Brisk diuresis noted.   Feels better. Denies SOB.   LVAD INTERROGATION:  HeartMate III LVAD:   Flow 5.7 liters/min, speed 6200, power 5.0 PI 2 VAD interrogated personally. Parameters stable.  Objective:    Vital Signs:   Temp:  [97.3 F (36.3 C)-98.1 F (36.7 C)] 97.3 F (36.3 C) (08/17 0655) Pulse Rate:  [37-145] 106 (08/17 0700) Resp:  [17-39] 29 (08/17 0700) BP: (72-136)/(54-119) 84/62 (08/17 0400) SpO2:  [90 %-100 %] 93 % (08/17 0700) Arterial Line BP: (73-148)/(63-133) 91/81 (08/17 0700) Weight:  [95.3 kg] 95.3 kg (08/17 0422) Last BM Date: 12/31/19 Mean arterial Pressure 70s   Intake/Output:   Intake/Output Summary (Last 24 hours) at 01/01/2020 0754 Last data filed at 01/01/2020 0700 Gross per 24 hour  Intake 1491 ml  Output 5320 ml  Net -3829 ml     Physical Exam  CVP 16 Physical Exam: GENERAL: No acute distress. HEENT: normal  NECK: Supple, JVP to jaw .  2+ bilaterally, no bruits.  No lymphadenopathy or thyromegaly appreciated.   CARDIAC:  Mechanical heart sounds with LVAD hum present.  LUNGS:  Clear to auscultation bilaterally.  ABDOMEN:  Soft, round, nontender, positive bowel sounds x4.     LVAD exit site: Dressing dry and intact.  No erythema or drainage.  Stabilization device present and accurately applied.  Driveline dressing is being changed daily per sterile technique. EXTREMITIES:  Warm and dry, no cyanosis, clubbing, rash or edema  NEUROLOGIC:  Alert and oriented x 3.    No aphasia.  No dysarthria.  Affect pleasant.      Telemetry   Sinus Tach 100-110s with NSVT.   Labs   Basic Metabolic Panel: Recent Labs  Lab 12/29/19 0420 12/29/19 0420 12/30/19 0325 12/30/19 0325 12/30/19 1410  12/31/19 0337 01/01/20 0423  NA 132*  --  136  --  135 135 131*  K 3.1*  --  3.3*  --  3.4* 4.0 4.3  CL 93*  --  96*  --  94* 92* 88*  CO2 28  --  30  --  30 33* 32  GLUCOSE 154*  --  137*  --  118* 120* 112*  BUN 25*  --  20  --  18 19 22*  CREATININE 1.99*  --  1.68*  --  1.37* 1.61* 1.50*  CALCIUM 8.3*   < > 8.6*   < > 8.5* 8.9 9.1  MG  --   --  1.5*  --   --  1.5*  --    < > = values in this interval not displayed.    Liver Function Tests: Recent Labs  Lab 12/27/19 1738 12/30/19 1410  AST 34 33  ALT 16 24  ALKPHOS 115 100  BILITOT 4.2* 2.7*  PROT 7.9 7.5  ALBUMIN 3.1* 2.8*   No results for input(s): LIPASE, AMYLASE in the last 168 hours. No results for input(s): AMMONIA in the last 168 hours.  CBC: Recent Labs  Lab 12/28/19 0339 12/28/19 0912 12/28/19 1800 12/29/19 0420 12/30/19 0325 12/31/19 0337 01/01/20 0423  WBC 13.7*  --   --  15.8* 11.2* 11.8* 12.2*  HGB 7.4*   < > 8.7* 9.0* 9.3* 9.7* 9.6*  HCT 24.1*   < >  28.1* 28.5* 30.2* 30.9* 31.5*  MCV 81.1  --   --  81.2 81.6 82.2 81.2  PLT 161  --   --  149* 144* 147* 152   < > = values in this interval not displayed.    INR: Recent Labs  Lab 12/28/19 0339 12/29/19 0420 12/30/19 0325 12/31/19 0337 01/01/20 0423  INR 3.0* 2.3* 1.7* 1.5* 1.4*    Other results:     Imaging   No results found.   Medications:     Scheduled Medications: . (feeding supplement) PROSource Plus  30 mL Oral BID BM  . cephALEXin  500 mg Oral Q8H  . Chlorhexidine Gluconate Cloth  6 each Topical Daily  . digoxin  0.125 mg Oral Daily  . docusate sodium  100 mg Oral BID  . DULoxetine  60 mg Oral Daily  . gabapentin  300 mg Oral TID  . insulin aspart  0-6 Units Subcutaneous TID AC & HS  . multivitamin with minerals  1 tablet Oral Daily  . pantoprazole  40 mg Oral BID  . sildenafil  20 mg Oral TID  . sodium chloride flush  10-40 mL Intracatheter Q12H  . Warfarin - Pharmacist Dosing Inpatient   Does not apply q1600    . zinc sulfate  220 mg Oral Daily    Infusions: . sodium chloride Stopped (12/30/19 1923)  . furosemide (LASIX) infusion 20 mg/hr (01/01/20 0700)  . heparin 600 Units/hr (01/01/20 0700)  . milrinone 0.25 mcg/kg/min (01/01/20 0700)  . norepinephrine (LEVOPHED) Adult infusion 3 mcg/min (12/31/19 0950)    PRN Medications: sodium chloride, acetaminophen, albuterol, hydrOXYzine, sodium chloride flush, traMADol  Assessment/Plan:    1. Lactic Acidosis likely due to cardiogenic shock - now resolved. 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 NE added - Ramp echo on 8/13 with severe RV dysfunction. AoV opening every beat. Speed increased 5600-> 6200. Tolerating well.  - Norepi stopped 8/14. Maps stable. CO-OX went back down. On 8/16 Norepi restarted at 3 mcg and continues on milrinone 0.25 mcg. Todays Co-OX 54%. .  - CVP 16. Continue lasix drip today. Weight down another 10  pounds.  - on sildenafil 20 tid and digoxin 0.125 for RV failure.  3.  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 Evaluated by TCTS and IR and found to be muscle flap with no draiable fluid collection 6/21 - Site looks ok today. CT of chest unremarkable, AF. - BCx negative - ID following. Was on  unasyn but switch back to Keflex on 8/15 -He is nota candidate for pump exchange given severe RV dysfunction and poor self-care  4. HM3 LVAD 09/06/2017. - Speed increased 5600 -> 6200 on 8/13 - LDH stable 305  - INR goal 2.0-2.5.  - INR 1.4  Continue warfarin + heparin drip. Discussed dosing with PharmD personally. - Off ASA with PUD.  5.Essential HTN - MAPs 70s . Continue to hold hydral and spiro    6. OSA - sleep study with  very severe OSA (AHI 69/hr) - Able to use CPAP at night.   - Needsf/u in sleep clinic. Arranged several times but he has not been compliant   7. H/o GI bleed  - 08/2018 EGD completed and showed gastritis and  nonbleeding duodenal ulcers, colonoscopy with 5 polyps removed. Protonix was increased to BID.  - hgb down from 9.9>>7.4. Received RBCs - hgb stable at 9.6 - He will stay off aspirin for now.   8. AKI - Scr 1.99 (1.4 on admit). Creatinine 1.5 today.   - 2/2 HF and volume overload - continue milrinone and NE. IV Lasix for diuresis - follow BMP   9. Hypokalemia - K 4.3  today, stable.   10. Hypomag -received 4 grams mag on 12/31/19 - Check Mag now.   Mobilize today. No plan to d/c on inotrope. Plan to diurese then will come off norepi followed by milrinone.    Length of Stay: 5  Amy Clegg, NP 01/01/2020, 7:54 AM  VAD Team --- VAD ISSUES ONLY--- Pager [phone] (7am - 7am)  Advanced Heart Failure Team  Pager [phone] (M-F; 7a - 4p)  Please contact [facility] for night-coverage after hours (4p -7a ) and weekends on [email or web address]  Agree with above.   He remains on milrinone, NE and lasix gtt. He is diuresing briskly, weight down down 23 pounds. CVP 15. Co-ox marginal.   General:  NAD.  HEENT: normal  Neck: supple. JVP to jaw.  Carotids 2+ bilat; no bruits. No lymphadenopathy or thryomegaly appreciated. Cor: LVAD hum.  Lungs: Clear. Abdomen: obese soft, nontender, non-distended. No hepatosplenomegaly. No bruits or masses. Good bowel sounds. Driveline site clean. Anchor in place.  Extremities: no cyanosis, clubbing, rash. Warm no edema  Neuro: alert & oriented x 3. No focal deficits. Moves all 4 without problem   He has end-stage RV failure. Improving on dual inotropes. Continue IV diuresis. Once fully diuresed, will attempt to wean drips. Not candidate for home inotropes. Renal function improved. VAD interrogated personally. Parameters stable.PI remains low.    CRITICAL CARE Performed by: Arvilla Meres  Total critical care time: 35 minutes  Critical care time was exclusive of separately billable procedures and treating other patients.  Critical care was necessary to treat or prevent imminent or life-threatening deterioration.  Critical care was time spent personally by me (independent of midlevel providers or residents) on the following activities: development of treatment plan with patient and/or surrogate as well as nursing, discussions with consultants, evaluation of patient's response to treatment, examination of patient, obtaining history from patient or surrogate, ordering and performing treatments and interventions, ordering and review of laboratory studies, ordering and review of radiographic studies, pulse oximetry and re-evaluation of patient's condition.  Arvilla Meres, MD  5:50 PM

## 2020-01-01 NOTE — Progress Notes (Signed)
CSW met with patient and provided laptop for patient to have virtual visit with psychiatry planned prior to current admission. CSW stepped out of the room for privacy during MD virtual visit. After visit patient stated all went well and will have a follow up in one month. CSW available as needed. Jackie , LCSW, CCSW-MCS [phone]  

## 2020-01-01 NOTE — Progress Notes (Signed)
LVAD Coordinator Rounding Note:  Admitted 12/28/19 by Dr [name] for severe left upper chest/ shoulder pain, tachycardia, and tachypnea. Lactic acid >11.   STAT CT of C/A/P negative for dissection. The subcutaneous fluid collection superficial to the sternum is not substantially changed from previous study. No acute abdominal process.   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 Laneta Simmers under Destination Therapy criteria.  Brisk diuresis noted.   Feels better. Denies SOB  Pt did not bring back up equipment. Loaner back up bag provided to pt.   Pt has psych appt today virtually. Our social worker will be coming at 1p to set the pt up with a laptop for his appt.  Vital signs: Temp: 97.8 HR: 106 Auto BP: 91/74 (82) Doppler: 82 Sat: 98% on RA Wt: 233.9>220.2>210 lbs  LVAD interrogation reveals:  Speed: 6200 Flow: 5.7 Power:  5.3w PI: 1.8  Alarms: none Events: none Hematocrit: 31  Fixed speed: 6200  Low speed limit: 5900  Drive Line:  Left abdominal drive line dressing C/D/I with anchor intact. Weekly dressing changes per BS nurse. Dressing change due 01/03/20.   Labs:  LDH trend: 252>282>283  INR trend: 3.0>1.5>1.4  WBC: 11.3>13.7>11.8>12.2  Lactic Acid: greater than 11 > 2.3  Drips: Milrinone 0.25 mcg/kg/min Levophed 3 mcg/min Heparin 600 u/hr Lasix 20 mg/hr  Infection: - 12/27/19 blood cx>> no growth 4 days  Blood Products: -12/28/19> 2 PRBCs  Anticoagulation Plan: -INR Goal: 2.0 - 2.5  -ASA Dose: none  Adverse Events: - driveline cx 04/23/19>> staph aureus - Admitted for sternal/drive line infection. OR for debridement 08/20/19- + staph aureus wound cultures; further irrigation/debridements done 08/22/19, 08/24/19, 08/30/19, and 09/03/19. Debridement and closure of sternal wound with muscle flap 09/05/19.  - Admitted 11/13/19 for respiratory distress and wound management of subxiphoid abscess. UDS + THC & benzos.  - Admitted 12/27/19 severe left chest/shoulder pain.  Profound right heart failure   Plan/Recommendations:  1. Call VAD pager if any VAD equipment or drive line issues. 2. Weekly drive line dressing changes by bedside nurse.   3. Loaner back up equipment at bedside.  Carlton Adam RN VAD Coordinator  Office: [phone]  24/7 Pager: [phone]

## 2020-01-02 LAB — BASIC METABOLIC PANEL
Anion gap: 10 (ref 5–15)
BUN: 21 mg/dL — ABNORMAL HIGH (ref 6–20)
CO2: 34 mmol/L — ABNORMAL HIGH (ref 22–32)
Calcium: 9.2 mg/dL (ref 8.9–10.3)
Chloride: 89 mmol/L — ABNORMAL LOW (ref 98–111)
Creatinine, Ser: 1.37 mg/dL — ABNORMAL HIGH (ref 0.61–1.24)
GFR calc Af Amer: >60 mL/min (ref >60)
GFR calc non Af Amer: >60 mL/min (ref >60)
Glucose, Bld: 101 mg/dL — ABNORMAL HIGH (ref 70–99)
Potassium: 3.4 mmol/L — ABNORMAL LOW (ref 3.5–5.1)
Sodium: 133 mmol/L — ABNORMAL LOW (ref 135–145)

## 2020-01-02 LAB — COOXEMETRY PANEL
Carboxyhemoglobin: 2.3 % — ABNORMAL HIGH (ref 0.5–1.5)
Carboxyhemoglobin: 2.1 % — ABNORMAL HIGH (ref 0.5–1.5)
Methemoglobin: 1 % (ref 0.0–1.5)
Methemoglobin: 1.2 % (ref 0.0–1.5)
O2 Saturation: 66.2 %
O2 Saturation: 54.6 %
Total hemoglobin: 13.5 g/dL (ref 12.0–16.0)
Total hemoglobin: 11.7 g/dL — ABNORMAL LOW (ref 12.0–16.0)

## 2020-01-02 LAB — CBC
HCT: 33.9 % — ABNORMAL LOW (ref 39.0–52.0)
Hemoglobin: 10.2 g/dL — ABNORMAL LOW (ref 13.0–17.0)
MCH: 24.3 pg — ABNORMAL LOW (ref 26.0–34.0)
MCHC: 30.1 g/dL (ref 30.0–36.0)
MCV: 80.9 fL (ref 80.0–100.0)
Platelets: 162 10*3/uL (ref 150–400)
RBC: 4.19 MIL/uL — ABNORMAL LOW (ref 4.22–5.81)
RDW: 19.2 % — ABNORMAL HIGH (ref 11.5–15.5)
WBC: 9.5 10*3/uL (ref 4.0–10.5)
nRBC: 0 % (ref 0.0–0.2)

## 2020-01-02 LAB — PROTIME-INR
INR: 1.5 — ABNORMAL HIGH (ref 0.8–1.2)
Prothrombin Time: 17.6 seconds — ABNORMAL HIGH (ref 11.4–15.2)

## 2020-01-02 LAB — GLUCOSE, CAPILLARY
Glucose-Capillary: 142 mg/dL — ABNORMAL HIGH (ref 70–99)
Glucose-Capillary: 153 mg/dL — ABNORMAL HIGH (ref 70–99)
Glucose-Capillary: 220 mg/dL — ABNORMAL HIGH (ref 70–99)
Glucose-Capillary: 176 mg/dL — ABNORMAL HIGH (ref 70–99)

## 2020-01-02 LAB — MAGNESIUM: Magnesium: 1.6 mg/dL — ABNORMAL LOW (ref 1.7–2.4)

## 2020-01-02 LAB — HEPARIN LEVEL (UNFRACTIONATED): Heparin Unfractionated: <0.1 IU/mL — ABNORMAL LOW (ref 0.30–0.70)

## 2020-01-02 LAB — LACTATE DEHYDROGENASE: LDH: 281 U/L — ABNORMAL HIGH (ref 98–192)

## 2020-01-02 MED ORDER — WARFARIN SODIUM 7.5 MG PO TABS
7.5000 mg | ORAL_TABLET | Freq: Once | ORAL | Status: AC
  Administered 2020-01-02: 7.5 mg via ORAL
  Filled 2020-01-02: qty 1

## 2020-01-02 MED ORDER — POTASSIUM CHLORIDE CRYS ER 20 MEQ PO TBCR
40.0000 meq | EXTENDED_RELEASE_TABLET | Freq: Two times a day (BID) | ORAL | Status: AC
  Administered 2020-01-02 (×2): 40 meq via ORAL
  Filled 2020-01-02 (×2): qty 2

## 2020-01-02 MED ORDER — MAGNESIUM SULFATE 2 GM/50ML IV SOLN
2.0000 g | Freq: Once | INTRAVENOUS | Status: AC
  Administered 2020-01-02: 2 g via INTRAVENOUS
  Filled 2020-01-02: qty 50

## 2020-01-02 NOTE — Progress Notes (Signed)
On arrival to shift, pt's VAD controller was tangled around itself, leads, and IV lines. This RN switched pt to battery packs in order to untangle lines. While I was personally attaching one of the chords to the battery pack (white chord temporarily unplugged from all power), the pt also disconnected himself from the black chord. I immediately connected him back to power and thoroughly educated the pt on necessity of changing one chord at a time, how lack of power means he has no cardiac function, etc. Although pt has history of poor self care and VAD maintenance, this was especially concerning to me as he has had the device for several years. Event noted on VAD history review; pt remained stable throughout. RN will continue to monitor very closely.

## 2020-01-02 NOTE — Progress Notes (Addendum)
Advanced Heart Failure VAD Team Note  PCP-Cardiologist: Arvilla Meres, MD   Subjective:    - Ramp echo 8/13 with severe RV dysfunction Speed 5600-> 6200 - On 8/16 norepi restarted due to low CO-OX. Continued on milrinone 0.25 mcg + lasix drip.   Continues to diurese well with lasix drip + norepi+ milrinone.  Overall weight down 36 pounds.   Over night he disconnected driveline. Denies SOB. Wants to go home.   LVAD INTERROGATION:  HeartMate III LVAD:   Flow 5.8 liters/min, speed 6200, power 5.0 PI 1.9  VAD interrogated personally. Parameters stable.  Objective:    Vital Signs:   Temp:  [97.8 F (36.6 C)-98.4 F (36.9 C)] 98 F (36.7 C) (08/18 0400) Pulse Rate:  [59-117] 59 (08/18 0700) Resp:  [15-32] 23 (08/18 0700) BP: (92-108)/(72-97) 93/76 (08/18 0600) SpO2:  [91 %-100 %] 100 % (08/18 0700) Arterial Line BP: (84)/(72) 84/72 (08/17 1200) Weight:  [89.6 kg] 89.6 kg (08/18 0500) Last BM Date: 01/01/20 Mean arterial Pressure 80s   Intake/Output:   Intake/Output Summary (Last 24 hours) at 01/02/2020 0751 Last data filed at 01/02/2020 0700 Gross per 24 hour  Intake 1780.92 ml  Output 5725 ml  Net -3944.08 ml     Physical Exam  CVP 6-7 personally checked.   Physical Exam: GENERAL: No acute distress. HEENT: normal  NECK: Supple, JVP 5-6  .  2+ bilaterally, no bruits.  No lymphadenopathy or thyromegaly appreciated.   CARDIAC:  Mechanical heart sounds with LVAD hum present.  LUNGS:  Clear to auscultation bilaterally.  ABDOMEN:  Soft, round, nontender, positive bowel sounds x4.     LVAD exit site: Dressing dry and intact.  No erythema or drainage.  Stabilization device present and accurately applied.  Driveline dressing is being changed daily per sterile technique. EXTREMITIES:  Warm and dry, no cyanosis, clubbing, rash or edema  NEUROLOGIC:  Alert and oriented x 3.    No aphasia.  No dysarthria.  Affect pleasant.      Telemetry  ST 90-100s  .   Labs   Basic  Metabolic Panel: Recent Labs  Lab 12/30/19 0325 12/30/19 0325 12/30/19 1410 12/30/19 1410 12/31/19 0337 01/01/20 0423 01/01/20 0816 01/02/20 0405  NA 136  --  135  --  135 131*  --  133*  K 3.3*  --  3.4*  --  4.0 4.3  --  3.4*  CL 96*  --  94*  --  92* 88*  --  89*  CO2 30  --  30  --  33* 32  --  34*  GLUCOSE 137*  --  118*  --  120* 112*  --  101*  BUN 20  --  18  --  19 22*  --  21*  CREATININE 1.68*  --  1.37*  --  1.61* 1.50*  --  1.37*  CALCIUM 8.6*   < > 8.5*   < > 8.9 9.1  --  9.2  MG 1.5*  --   --   --  1.5*  --  1.8 1.6*   < > = values in this interval not displayed.    Liver Function Tests: Recent Labs  Lab 12/27/19 1738 12/30/19 1410  AST 34 33  ALT 16 24  ALKPHOS 115 100  BILITOT 4.2* 2.7*  PROT 7.9 7.5  ALBUMIN 3.1* 2.8*   No results for input(s): LIPASE, AMYLASE in the last 168 hours. No results for input(s): AMMONIA in the last 168 hours.  CBC: Recent Labs  Lab 12/29/19 0420 12/30/19 0325 12/31/19 0337 01/01/20 0423 01/02/20 0405  WBC 15.8* 11.2* 11.8* 12.2* 9.5  HGB 9.0* 9.3* 9.7* 9.6* 10.2*  HCT 28.5* 30.2* 30.9* 31.5* 33.9*  MCV 81.2 81.6 82.2 81.2 80.9  PLT 149* 144* 147* 152 162    INR: Recent Labs  Lab 12/29/19 0420 12/30/19 0325 12/31/19 0337 01/01/20 0423 01/02/20 0405  INR 2.3* 1.7* 1.5* 1.4* 1.5*    Other results:     Imaging   No results found.   Medications:     Scheduled Medications: . (feeding supplement) PROSource Plus  30 mL Oral BID BM  . cephALEXin  500 mg Oral Q8H  . Chlorhexidine Gluconate Cloth  6 each Topical Daily  . digoxin  0.125 mg Oral Daily  . docusate sodium  100 mg Oral BID  . DULoxetine  60 mg Oral Daily  . gabapentin  300 mg Oral TID  . insulin aspart  0-6 Units Subcutaneous TID AC & HS  . multivitamin with minerals  1 tablet Oral Daily  . pantoprazole  40 mg Oral BID  . potassium chloride  40 mEq Oral BID  . sildenafil  20 mg Oral TID  . sodium chloride flush  10-40 mL  Intracatheter Q12H  . Warfarin - Pharmacist Dosing Inpatient   Does not apply q1600  . zinc sulfate  220 mg Oral Daily    Infusions: . sodium chloride Stopped (12/30/19 1923)  . furosemide (LASIX) infusion 20 mg/hr (01/02/20 0700)  . heparin 600 Units/hr (01/02/20 0700)  . magnesium sulfate bolus IVPB 50 mL/hr at 01/02/20 0700  . milrinone 0.25 mcg/kg/min (01/02/20 0700)  . norepinephrine (LEVOPHED) Adult infusion 3 mcg/min (01/02/20 0700)    PRN Medications: sodium chloride, acetaminophen, albuterol, hydrOXYzine, sodium chloride flush, traMADol  Assessment/Plan:    1. Lactic Acidosis likely due to cardiogenic shock - now resolved. 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 NE added - Ramp echo on 8/13 with severe RV dysfunction. AoV opening every beat. Speed increased 5600-> 6200. Tolerating well.  - Norepi stopped 8/14. Maps stable. CO-OX went back down.  - On 8/16 Norepi restarted at 3 mcg and continues on milrinone 0.25 mcg.  - Todays Co-OX 66 . CVP down to 6-7.Stop lasix drip. Anticipate starting torsemide 60 mg twice a day.  -Cut back norepi to 2 mcg and likely stop tomorrow then will cut back milrinone.   - on sildenafil 20 tid and digoxin 0.125 for RV failure. - Renal function stable.   3.  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 Evaluated by TCTS and IR and found to be muscle flap with no draiable fluid collection 6/21 - Site looks ok today. CT of chest unremarkable, AF. - BCx negative - ID following. Was on  unasyn but switch back to Keflex on 8/15 -He is nota candidate for pump exchange given severe RV dysfunction and poor self-care  4. HM3 LVAD 09/06/2017. - Speed increased 5600  -> 6200 on 8/13 - LDH stable 281  - INR goal 2.0-2.5.  - INR 1.5   Continue warfarin + heparin drip. Discussed dosing with PharmD personally. - Off ASA with PUD.  5.Essential HTN - MAPs stable. Continue to hold hydral and  spiro    6. OSA - sleep study with very severe OSA (AHI 69/hr) - Able to use CPAP at night.   - Needsf/u in sleep clinic. Arranged several times but he has not been compliant   7. H/o GI bleed  - 08/2018 EGD completed and showed gastritis and nonbleeding duodenal ulcers, colonoscopy with 5 polyps removed. Protonix was increased to BID.  - hgb down from 9.9>>7.4. Received RBCs - hgb stable at 10.2  - He will stay off aspirin for now.   8. AKI - Scr 1.99 (1.4 on admit). Creatinine 1.4 today.   - 2/2 HF and volume overload - continue milrinone and NE. IV Lasix for diuresis    9. Hypokalemia - K 3.4 Supp K.    10. Hypomag -received 4 grams mag on 12/31/19 -Mag low today. Give another 4 grams Mag    He will not d/c on inotropes. Plan to wean off and remove picc. Also discussed importance of not disconnecting from power. Had no external power alarm.    Length of Stay: 6  Amy Clegg, NP 01/02/2020, 7:51 AM  VAD Team --- VAD ISSUES ONLY--- Pager [phone] (7am - 7am)  Advanced Heart Failure Team  Pager [phone] (M-F; 7a - 4p)  Please contact [facility] for night-coverage after hours (4p -7a ) and weekends on [email or web address]  Agree with above.   On NE 3 and milrinone 0.25. Weight down 36 pounds on lasix gtt. CVP 6-7. Feeling better. No SOB, orthopnea or PND.   General:  NAD.  HEENT: normal  Neck: supple. JVP not elevated.  Carotids 2+ bilat; no bruits. No lymphadenopathy or thryomegaly appreciated. Cor: LVAD hum.  + RV lifts Lungs: Clear. Abdomen: obese soft, nontender, non-distended. No hepatosplenomegaly. No bruits or masses. Good bowel sounds. Driveline site clean. Anchor in place.  Extremities: no cyanosis, clubbing, rash. Warm no edema    Neuro: alert & oriented x 3. No focal deficits. Moves all 4 without problem   He is fully diuresed. Stop IV lasix and begin to wean inotropes. I turned VAD up to 6300. Supp K and mag. Not candidate for home inotropes. VAD interrogated personally. Parameters stable.Suspect he may need midodrine.   CRITICAL CARE Performed by: Arvilla Meres  Total critical care time: 35 minutes  Critical care time was exclusive of separately billable procedures and treating other patients.  Critical care was necessary to treat or prevent imminent or life-threatening deterioration.  Critical care was time spent personally by me (independent of midlevel providers or residents) on the following activities: development of treatment plan with patient and/or surrogate as well as nursing, discussions with consultants, evaluation of patient's response to treatment, examination of patient, obtaining history from patient or surrogate, ordering and performing treatments and interventions, ordering and review of laboratory studies, ordering and review of radiographic studies, pulse oximetry and re-evaluation of patient's condition.  Arvilla Meres, MD  9:23 AM

## 2020-01-02 NOTE — Progress Notes (Signed)
LVAD Coordinator Rounding Note:  Admitted 12/28/19 by Dr [name] for severe left upper chest/ shoulder pain, tachycardia, and tachypnea. Lactic acid >11.   STAT CT of C/A/P negative for dissection. The subcutaneous fluid collection superficial to the sternum is not substantially changed from previous study. No acute abdominal process.   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 Laneta Simmers under Destination Therapy criteria.  Continues to diurese well with lasix drip + norepi+ milrinone.  Overall weight down 36 pounds.   No external power alarm noted on interrogation. Over night he double disconnected while trying to get up to go to the bathroom driveline.   Denies SOB. Wants to go home.  Vital signs: Temp: 98.2 HR: 102 Auto BP: 93/76 (83) Doppler: not done Sat: 100% on RA Wt: 233.9>220.2>210>197.5 lbs  LVAD interrogation reveals:  Speed: 6200 Flow: 5.7 Power:  5.4w PI: 1.9  Alarms: no external power last night-pt double disconnected while going to bathroom Events: none Hematocrit: 34  Fixed speed: 6200  Low speed limit: 5900  Drive Line:  Left abdominal drive line dressing C/D/I with anchor intact. Weekly dressing changes per BS nurse. Dressing change due 01/03/20.   Labs:  LDH trend: 252>282>283>281  INR trend: 3.0>1.5>1.4>1.5  WBC: 11.3>13.7>11.8>12.2>9.5  Lactic Acid: greater than 11 > 2.3  Drips: Milrinone 0.25 mcg/kg/min Levophed 3 mcg/min Heparin 600 u/hr Lasix 20 mg/hr-stopped today 8/18  Infection: - 12/27/19 blood cx>> no growth 4 days  Blood Products: -12/28/19> 2 PRBCs  Anticoagulation Plan: -INR Goal: 2.0 - 2.5  -ASA Dose: none  Adverse Events: - driveline cx 04/23/19>> staph aureus - Admitted for sternal/drive line infection. OR for debridement 08/20/19- + staph aureus wound cultures; further irrigation/debridements done 08/22/19, 08/24/19, 08/30/19, and 09/03/19. Debridement and closure of sternal wound with muscle flap 09/05/19.  - Admitted 11/13/19 for  respiratory distress and wound management of subxiphoid abscess. UDS + THC & benzos.  - Admitted 12/27/19 severe left chest/shoulder pain. Profound right heart failure   Plan/Recommendations:  1. Call VAD pager if any VAD equipment or drive line issues. 2. Weekly drive line dressing changes by bedside nurse.   3. Loaner back up equipment at bedside.  Carlton Adam RN VAD Coordinator  Office: [phone]  24/7 Pager: [phone]

## 2020-01-02 NOTE — Progress Notes (Signed)
ANTICOAGULATION CONSULT NOTE - Follow Up Consult  Pharmacy Consult for warfarin/heparin Indication: LVAD  No Known Allergies  Patient Measurements: Height: 6\' 2"  (188 cm) Weight: 89.6 kg (197 lb 8.5 oz) IBW/kg (Calculated) : 82.2 Heparin Dosing Weight: 98.9 kg   Vital Signs: Temp: 98 F (36.7 C) (08/18 0400) Temp Source: Oral (08/18 0400) BP: 93/76 (08/18 0600) Pulse Rate: 59 (08/18 0700)  Labs: Recent Labs    12/31/19 0337 12/31/19 0337 12/31/19 2135 01/01/20 0423 01/02/20 0405  HGB 9.7*   < >  --  9.6* 10.2*  HCT 30.9*  --   --  31.5* 33.9*  PLT 147*  --   --  152 162  LABPROT 17.4*  --   --  16.6* 17.6*  INR 1.5*  --   --  1.4* 1.5*  HEPARINUNFRC  --   --  <0.10* <0.10* <0.10*  CREATININE 1.61*  --   --  1.50* 1.37*   < > = values in this interval not displayed.    Estimated Creatinine Clearance: 78.3 mL/min (A) (by C-G formula based on SCr of 1.37 mg/dL (H)).   Medical History: Past Medical History:  Diagnosis Date   Asthma    CHF (congestive heart failure) (HCC)    a. 09/2016: EF 20-25% with cath showing normal cors   GERD (gastroesophageal reflux disease)    History of hiatal hernia    LVAD (left ventricular assist device) present (HCC)    OSA on CPAP 09/06/2018   Severe OSA with AHI 68/hr on CPAP at 12cm H2O    Medications:  Scheduled:   (feeding supplement) PROSource Plus  30 mL Oral BID BM   cephALEXin  500 mg Oral Q8H   Chlorhexidine Gluconate Cloth  6 each Topical Daily   digoxin  0.125 mg Oral Daily   docusate sodium  100 mg Oral BID   DULoxetine  60 mg Oral Daily   gabapentin  300 mg Oral TID   insulin aspart  0-6 Units Subcutaneous TID AC & HS   multivitamin with minerals  1 tablet Oral Daily   pantoprazole  40 mg Oral BID   potassium chloride  40 mEq Oral BID   sildenafil  20 mg Oral TID   sodium chloride flush  10-40 mL Intracatheter Q12H   Warfarin - Pharmacist Dosing Inpatient   Does not apply q1600   zinc  sulfate  220 mg Oral Daily    Assessment: 48 yom presenting with upper wall chest pain - has hx of muscle flap wound infection growing MSSA in past followed by ID. On warfarin for hx LVAD HM3 (LD confirmed with patient on 8/10).  PTA regimen: 2.5 mg daily except 5 mg MWF  INR still subtherapeutic at 1.5 after restarting warfarin. Hgb stable at 10.2, received 2 units PRBC 8/13. Plts stable 162. LDH stable. No overt bleeding.   Heparin level undetectable as expected on low-dose heparin drip while INR subtherapeutic. No titrations planned.   Goal of Therapy:  INR 2-2.5 Monitor platelets by anticoagulation protocol: Yes   Plan:  Warfarin 7.5 mg PO tonight x1 IV heparin at 600 units/hr, no titrations Monitor daily INR, CBC, and for s/sx of bleeding   9/13 PharmD., BCPS Clinical Pharmacist 01/02/2020 8:10 AM  [facility] pharmacy phone numbers are listed on [email or web address]

## 2020-01-03 LAB — PROTIME-INR
INR: 1.7 — ABNORMAL HIGH (ref 0.8–1.2)
Prothrombin Time: 19.1 seconds — ABNORMAL HIGH (ref 11.4–15.2)

## 2020-01-03 LAB — CBC
HCT: 33.7 % — ABNORMAL LOW (ref 39.0–52.0)
Hemoglobin: 10.1 g/dL — ABNORMAL LOW (ref 13.0–17.0)
MCH: 24.6 pg — ABNORMAL LOW (ref 26.0–34.0)
MCHC: 30 g/dL (ref 30.0–36.0)
MCV: 82 fL (ref 80.0–100.0)
Platelets: 184 10*3/uL (ref 150–400)
RBC: 4.11 MIL/uL — ABNORMAL LOW (ref 4.22–5.81)
RDW: 19.5 % — ABNORMAL HIGH (ref 11.5–15.5)
WBC: 9.2 10*3/uL (ref 4.0–10.5)
nRBC: 0 % (ref 0.0–0.2)

## 2020-01-03 LAB — DIGOXIN LEVEL: Digoxin Level: 0.4 ng/mL — ABNORMAL LOW (ref 0.8–2.0)

## 2020-01-03 LAB — COOXEMETRY PANEL
Carboxyhemoglobin: 2.3 % — ABNORMAL HIGH (ref 0.5–1.5)
Methemoglobin: 1.2 % (ref 0.0–1.5)
O2 Saturation: 68.1 %
Total hemoglobin: 11 g/dL — ABNORMAL LOW (ref 12.0–16.0)

## 2020-01-03 LAB — BASIC METABOLIC PANEL
Anion gap: 7 (ref 5–15)
BUN: 20 mg/dL (ref 6–20)
CO2: 33 mmol/L — ABNORMAL HIGH (ref 22–32)
Calcium: 9.1 mg/dL (ref 8.9–10.3)
Chloride: 95 mmol/L — ABNORMAL LOW (ref 98–111)
Creatinine, Ser: 1.27 mg/dL — ABNORMAL HIGH (ref 0.61–1.24)
GFR calc Af Amer: >60 mL/min (ref >60)
GFR calc non Af Amer: >60 mL/min (ref >60)
Glucose, Bld: 134 mg/dL — ABNORMAL HIGH (ref 70–99)
Potassium: 4 mmol/L (ref 3.5–5.1)
Sodium: 135 mmol/L (ref 135–145)

## 2020-01-03 LAB — MAGNESIUM: Magnesium: 2.3 mg/dL (ref 1.7–2.4)

## 2020-01-03 LAB — LACTATE DEHYDROGENASE: LDH: 239 U/L — ABNORMAL HIGH (ref 98–192)

## 2020-01-03 LAB — HEPARIN LEVEL (UNFRACTIONATED): Heparin Unfractionated: <0.1 IU/mL — ABNORMAL LOW (ref 0.30–0.70)

## 2020-01-03 LAB — GLUCOSE, CAPILLARY
Glucose-Capillary: 119 mg/dL — ABNORMAL HIGH (ref 70–99)
Glucose-Capillary: 121 mg/dL — ABNORMAL HIGH (ref 70–99)
Glucose-Capillary: 161 mg/dL — ABNORMAL HIGH (ref 70–99)
Glucose-Capillary: 159 mg/dL — ABNORMAL HIGH (ref 70–99)

## 2020-01-03 MED ORDER — WARFARIN SODIUM 5 MG PO TABS
5.0000 mg | ORAL_TABLET | Freq: Once | ORAL | Status: AC
  Administered 2020-01-03: 5 mg via ORAL
  Filled 2020-01-03: qty 1

## 2020-01-03 MED ORDER — NYSTATIN 100000 UNIT/GM EX POWD
Freq: Three times a day (TID) | CUTANEOUS | Status: AC
  Filled 2020-01-03 (×2): qty 15

## 2020-01-03 MED ORDER — TORSEMIDE 20 MG PO TABS
40.0000 mg | ORAL_TABLET | Freq: Every day | ORAL | Status: DC
  Administered 2020-01-03 – 2020-01-06 (×4): 40 mg via ORAL
  Filled 2020-01-03 (×4): qty 2

## 2020-01-03 NOTE — Progress Notes (Addendum)
Advanced Heart Failure VAD Team Note  PCP-Cardiologist: Arvilla Meres, MD   Subjective:    - Ramp echo 8/13 with severe RV dysfunction Speed 5600-> 6200 - On 8/16 norepi restarted due to low CO-OX. Continued on milrinone 0.25 mcg + lasix drip. - 01/02/20 Norepi cut back 2 and VAD speed increased to 6300   Yesterday VAD speed increased to 6300, lasix drip stopped, and norepi cut back 2 mcg.   Walking in the unit. Feels great. Wants to go home.     LVAD INTERROGATION:  HeartMate III LVAD:   Flow 5.9 liters/min, speed 6300, power 6.0 PI 2.4   VAD interrogated personally. Parameters stable.  Objective:    Vital Signs:   Temp:  [98.1 F (36.7 C)-98.2 F (36.8 C)] 98.1 F (36.7 C) (08/18 2349) Pulse Rate:  [54-117] 54 (08/19 0500) Resp:  [16-28] 23 (08/19 0500) BP: (69-126)/(49-112) 73/49 (08/19 0500) SpO2:  [93 %-100 %] 96 % (08/19 0500) Last BM Date: 01/02/20 Mean arterial Pressure 80s   Intake/Output:   Intake/Output Summary (Last 24 hours) at 01/03/2020 0610 Last data filed at 01/03/2020 0500 Gross per 24 hour  Intake 1148.22 ml  Output 1275 ml  Net -126.78 ml     Physical Exam  CVP 8 Physical Exam: GENERAL: No acute distress. Walking in the unit.  HEENT: normal  NECK: Supple, JVP  7-8 .  2+ bilaterally, no bruits.  No lymphadenopathy or thyromegaly appreciated.  RIJ  CARDIAC:  Mechanical heart sounds with LVAD hum present.  LUNGS:  Clear to auscultation bilaterally.  ABDOMEN:  Soft, round, nontender, positive bowel sounds x4.     LVAD exit site: well-healed and incorporated.  Dressing dry and intact.  No erythema or drainage.  Stabilization device present and accurately applied.  Driveline dressing is being changed daily per sterile technique. EXTREMITIES:  Warm and dry, no cyanosis, clubbing, rash or edema  NEUROLOGIC:  Alert and oriented x 3.    No aphasia.  No dysarthria.  Affect pleasant.     Telemetry   SR 80-90s  Labs   Basic Metabolic  Panel: Recent Labs  Lab 12/30/19 0325 12/30/19 0325 12/30/19 1410 12/30/19 1410 12/31/19 0337 12/31/19 0337 01/01/20 0423 01/01/20 0816 01/02/20 0405 01/03/20 0500  NA 136   < > 135  --  135  --  131*  --  133* 135  K 3.3*   < > 3.4*  --  4.0  --  4.3  --  3.4* 4.0  CL 96*   < > 94*  --  92*  --  88*  --  89* 95*  CO2 30   < > 30  --  33*  --  32  --  34* 33*  GLUCOSE 137*   < > 118*  --  120*  --  112*  --  101* 134*  BUN 20   < > 18  --  19  --  22*  --  21* 20  CREATININE 1.68*   < > 1.37*  --  1.61*  --  1.50*  --  1.37* 1.27*  CALCIUM 8.6*   < > 8.5*   < > 8.9   < > 9.1  --  9.2 9.1  MG 1.5*  --   --   --  1.5*  --   --  1.8 1.6* 2.3   < > = values in this interval not displayed.    Liver Function Tests: Recent Labs  Lab 12/27/19 1738  12/30/19 1410  AST 34 33  ALT 16 24  ALKPHOS 115 100  BILITOT 4.2* 2.7*  PROT 7.9 7.5  ALBUMIN 3.1* 2.8*   No results for input(s): LIPASE, AMYLASE in the last 168 hours. No results for input(s): AMMONIA in the last 168 hours.  CBC: Recent Labs  Lab 12/30/19 0325 12/31/19 0337 01/01/20 0423 01/02/20 0405 01/03/20 0500  WBC 11.2* 11.8* 12.2* 9.5 9.2  HGB 9.3* 9.7* 9.6* 10.2* 10.1*  HCT 30.2* 30.9* 31.5* 33.9* 33.7*  MCV 81.6 82.2 81.2 80.9 82.0  PLT 144* 147* 152 162 184    INR: Recent Labs  Lab 12/30/19 0325 12/31/19 0337 01/01/20 0423 01/02/20 0405 01/03/20 0500  INR 1.7* 1.5* 1.4* 1.5* 1.7*    Other results:     Imaging   No results found.   Medications:     Scheduled Medications: . (feeding supplement) PROSource Plus  30 mL Oral BID BM  . cephALEXin  500 mg Oral Q8H  . Chlorhexidine Gluconate Cloth  6 each Topical Daily  . digoxin  0.125 mg Oral Daily  . docusate sodium  100 mg Oral BID  . DULoxetine  60 mg Oral Daily  . gabapentin  300 mg Oral TID  . insulin aspart  0-6 Units Subcutaneous TID AC & HS  . multivitamin with minerals  1 tablet Oral Daily  . pantoprazole  40 mg Oral BID  .  sildenafil  20 mg Oral TID  . sodium chloride flush  10-40 mL Intracatheter Q12H  . Warfarin - Pharmacist Dosing Inpatient   Does not apply q1600  . zinc sulfate  220 mg Oral Daily    Infusions: . sodium chloride Stopped (12/30/19 1923)  . heparin 600 Units/hr (01/03/20 0500)  . milrinone 0.25 mcg/kg/min (01/03/20 0500)  . norepinephrine (LEVOPHED) Adult infusion 2 mcg/min (01/03/20 0500)    PRN Medications: sodium chloride, acetaminophen, albuterol, hydrOXYzine, sodium chloride flush, traMADol  Assessment/Plan:    1. Lactic Acidosis likely due to cardiogenic shock - now resolved. 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 NE added - Ramp echo on 8/13 with severe RV dysfunction. AoV opening every beat. Speed increased 5600-> 6200. Tolerating well.  - Norepi stopped 8/14. Maps stable. CO-OX went back down.  - On 8/16 Norepi restarted at 3 mcg and continues on milrinone 0.25 mcg.  - Todays Co-OX 68% . Stop norepi and likely stop milrinone tomorrow.  - on sildenafil 20 tid and digoxin 0.125 for RV failure. Dig level 0.4  - Renal function stable.   3.  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 Evaluated by TCTS and IR and found to be muscle flap with no draiable fluid collection 6/21 - Site looks ok today. CT of chest unremarkable, AF. - BCx negative - ID following. Was on  unasyn but switch back to Keflex on 8/15 -He is nota candidate for pump exchange given severe RV dysfunction and poor self-care  4. HM3 LVAD 09/06/2017. - Speed increased 5600 -> 6200->6300 8/19  - LDH stable 239   - INR goal 2.0-2.5.  - INR 1.7   Continue warfarin + heparin drip. Discussed dosing with PharmD personally. -  Off ASA with PUD.  5.Essential HTN - MAPs stable. Continue to hold  hydral and spiro    6. OSA - sleep study with very severe OSA (AHI 69/hr) - Able to use CPAP at night.   - Needsf/u in sleep clinic. Arranged several times but he has not been compliant   7. H/o GI bleed  - 08/2018 EGD completed and showed gastritis and nonbleeding duodenal ulcers, colonoscopy with 5 polyps removed. Protonix was increased to BID.  - hgb down from 9.9>>7.4. Received RBCs - hgb stable at 10.1 - He will stay off aspirin for now.   8. AKI - Scr 1.99 (1.4 on admit). Creatinine 1.4 today.   - 2/2 HF and volume overload - continue milrinone and NE. IV Lasix for diuresis   9. Hypokalemia - K 4  10. Hypomag -received 4 grams mag on 12/31/19 and 01/02/20  -Mag stable today.   Transfer to 2 C.     Length of Stay: 7  Amy Clegg, NP 01/03/2020, 6:10 AM  VAD Team --- VAD ISSUES ONLY--- Pager [phone] (7am - 7am)  Advanced Heart Failure Team  Pager [phone] (M-F; 7a - 4p)  Please contact [facility] for night-coverage after hours (4p -7a ) and weekends on [email or web address]  Agree with above.   LVAD speed increased to 6300 yesterday. He remains on NE 3 and milrinone 0.25. Co-ox 68% CVP 8-9. Able to ambulate halls. On heparin/warafarin.   General:  NAD.  HEENT: normal  Neck: supple. JVP not elevated.  Carotids 2+ bilat; no bruits. No lymphadenopathy or thryomegaly appreciated. Cor: LVAD hum.  Lungs: Clear. Abdomen: obese soft, nontender, non-distended. No hepatosplenomegaly. No bruits or masses. Good bowel sounds. Driveline site clean. Anchor in place.  Extremities: no cyanosis, clubbing, rash. Warm no edema  Neuro: alert & oriented x 3. No focal deficits. Moves all 4 without problem   Volume status much improved. Shock improved. Stop NE. Continue milrinone. Change IV lasix to oral torsemide. VAD interrogated personally. Parameters stable. Stop heparin.   Goal will be to wean NE today and try  to wean milrinone over next few days. VAD interrogated personally. Parameters stable.  Arvilla Meres, MD  9:34 AM

## 2020-01-03 NOTE — Discharge Instructions (Signed)

## 2020-01-03 NOTE — Progress Notes (Signed)
LVAD Coordinator Rounding Note:  Admitted 12/28/19 by Dr [name] for severe left upper chest/ shoulder pain, tachycardia, and tachypnea. Lactic acid >11.   STAT CT of C/A/P negative for dissection. The subcutaneous fluid collection superficial to the sternum is not substantially changed from previous study. No acute abdominal process.   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 Laneta Simmers under Destination Therapy criteria.  Lying in bed awake. Reports he walked 2 laps in unit earlier this am. Denies complaints. Planned transfer to [facility] today per Dr. [name]. Pt discussed desire to be home by 01/07/20 for his son's open house at school.  Dr. [name] increased VAD speed and would like to increase more if possible. Ramp echo ordered and scheduled for tomorrow at 2:00 pm.  Pt says he doesn't understand dietary restrictions of heart failure diet. Consult for dietary assistance placed; pt agrees to same.  Vital signs: Temp: 97.8 HR: 106 Auto BP: 83/55 (65) Doppler: 86 Sat: 100% on RA Wt: 233.9>220.2>210>197.5>194 lbs  LVAD interrogation reveals:  Speed: 6200 Flow: 5.9 Power:  5.5w PI: 2.3  Alarms: none Events: none Hematocrit: 34  Fixed speed: 6300 Low speed limit: 6000  Drive Line:  Left abdominal drive line dressing C/D/I with anchor intact. Weekly dressing changes per BS nurse. Existing VAD dressing removed and site care performed using sterile technique. Drive line exit site cleaned with Chlora prep applicators x 2, rinsed with sterile saline, allowed to dry, and Sorbaview dressing with bio patch re-applied. Exit site healed and incorporated, the velour is fully implanted at exit site. No redness, tenderness, drainage, foul odor or rash noted. Drive line anchor re-applied. Next dressing change due 01/10/20.   Labs:  LDH trend: 252>282>283>281>239  INR trend: 3.0>1.5>1.4>1.5>1.7  Drips: Milrinone 0.25 mcg/kg/min Heparin 600 u/hr  Infection: - 12/27/19 blood cx>>  negative  Blood Products: -12/28/19> 2 PRBCs  Anticoagulation Plan: -INR Goal: 2.0 - 2.5  -ASA Dose: none  Adverse Events: - driveline cx 04/23/19>> staph aureus - Admitted for sternal/drive line infection. OR for debridement 08/20/19- + staph aureus wound cultures; further irrigation/debridements done 08/22/19, 08/24/19, 08/30/19, and 09/03/19. Debridement and closure of sternal wound with muscle flap 09/05/19.  - Admitted 11/13/19 for respiratory distress and wound management of subxiphoid abscess. UDS + THC & benzos.  - Admitted 12/27/19 severe left chest/shoulder pain. Profound right heart failure   Plan/Recommendations:  1. Call VAD pager if any VAD equipment or drive line issues. 2. Weekly drive line dressing changes by bedside nurse.   3. Loaner back up equipment at bedside. 4. Ramp echo at bedside scheduled for 01/04/20 at 2:00 pm.  Hessie Diener RN VAD Coordinator  Office: [phone]  24/7 Pager: [phone]

## 2020-01-03 NOTE — Progress Notes (Addendum)
ANTICOAGULATION CONSULT NOTE - Follow Up Consult  Pharmacy Consult for warfarin/heparin Indication: LVAD  No Known Allergies  Patient Measurements: Height: 6\' 2"  (188 cm) Weight: 88 kg (194 lb 1.6 oz) IBW/kg (Calculated) : 82.2 Heparin Dosing Weight: 98.9 kg   Vital Signs: Temp: 97.4 F (36.3 C) (08/19 0742) Temp Source: Oral (08/19 0742) BP: 95/83 (08/19 0600) Pulse Rate: 37 (08/19 0600)  Labs: Recent Labs    01/01/20 0423 01/01/20 0423 01/02/20 0405 01/03/20 0500  HGB 9.6*   < > 10.2* 10.1*  HCT 31.5*  --  33.9* 33.7*  PLT 152  --  162 184  LABPROT 16.6*  --  17.6* 19.1*  INR 1.4*  --  1.5* 1.7*  HEPARINUNFRC <0.10*  --  <0.10* <0.10*  CREATININE 1.50*  --  1.37* 1.27*   < > = values in this interval not displayed.    Estimated Creatinine Clearance: 84.5 mL/min (A) (by C-G formula based on SCr of 1.27 mg/dL (H)).   Medical History: Past Medical History:  Diagnosis Date  . Asthma   . CHF (congestive heart failure) (HCC)    a. 09/2016: EF 20-25% with cath showing normal cors  . GERD (gastroesophageal reflux disease)   . History of hiatal hernia   . LVAD (left ventricular assist device) present (HCC)   . OSA on CPAP 09/06/2018   Severe OSA with AHI 68/hr on CPAP at 12cm H2O    Medications:  Scheduled:  . (feeding supplement) PROSource Plus  30 mL Oral BID BM  . cephALEXin  500 mg Oral Q8H  . Chlorhexidine Gluconate Cloth  6 each Topical Daily  . digoxin  0.125 mg Oral Daily  . docusate sodium  100 mg Oral BID  . DULoxetine  60 mg Oral Daily  . gabapentin  300 mg Oral TID  . insulin aspart  0-6 Units Subcutaneous TID AC & HS  . multivitamin with minerals  1 tablet Oral Daily  . pantoprazole  40 mg Oral BID  . sildenafil  20 mg Oral TID  . sodium chloride flush  10-40 mL Intracatheter Q12H  . Warfarin - Pharmacist Dosing Inpatient   Does not apply q1600  . zinc sulfate  220 mg Oral Daily    Assessment: 48 yom presenting with upper wall chest pain -  has hx of muscle flap wound infection growing MSSA in past followed by ID. On warfarin for hx LVAD HM3 (LD confirmed with patient on 8/10).  PTA regimen: 2.5 mg daily except 5 mg MWF  INR still subtherapeutic at 1.7 but trending up. Hgb stable at 10.1, received 2 units PRBC 8/13. Plts stable 184. LDH stable. No overt bleeding.   Heparin level undetectable as expected on low-dose heparin drip while INR subtherapeutic. No titrations planned.   Goal of Therapy:  INR 2-2.5 Monitor platelets by anticoagulation protocol: Yes   Plan:  Warfarin 5 mg PO tonight x1 IV heparin at 600 units/hr, no titrations Monitor daily INR, CBC, and for s/sx of bleeding   9/13 PharmD., BCPS Clinical Pharmacist 01/03/2020 7:45 AM  [name] pharmacy phone numbers are listed on [email or web address]  Addendum:  Ok per HF team to stop heparin this morning.

## 2020-01-03 NOTE — Progress Notes (Signed)
Nutrition Follow-up  DOCUMENTATION CODES:   Not applicable  INTERVENTION:   Provided education on low sodium diet   D/c Prosource Plus; pt refusing  Continue Dysphagia 3 diet with thin liquids   Continue Magic cup TID with meals, each supplement provides 290 kcal and 9 grams of protein  Continue double protein portions  Continue MVI daily   NUTRITION DIAGNOSIS:   Increased nutrient needs related to acute illness as evidenced by estimated needs.  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 Presents this admission with lactic acidosis from unknown etiology.  RD received consult to provide education on low sodium diet. Provided "Low Sodium Nutrition Therapy" handout from the Academy of [employer]. Reviewed patient's dietary recall. Provided examples on ways to decrease sodium intake in diet. Discouraged intake of processed foods and use of salt shaker. Encouraged fresh fruits and vegetables as well as whole grain sources of carbohydrates to maximize fiber intake.  Teach back method used. Expect fair compliance.  Pt reports an aversion to the food available at the hospital. Of note, pt's appetite does appear to be improving as last 4 meals have been documented as 85-100% completion. RD observed nearly completed 9.25oz bag of Doritos at bedside that pt reports having eaten throughout the week.   Labs: CBGs [phone] Medications: 64ml Prosource Plus BID (pt refusing per RN), Colace, Novolog, MVI, Protonix, Warfarin, Zinc sulfate Drips: Primacor   Diet Order:   Diet Order            DIET DYS 3 Room service appropriate? Yes; Fluid  consistency: Thin  Diet effective now                 EDUCATION NEEDS:   Education needs have been addressed  Skin:  Skin Assessment: Reviewed RN Assessment  Last BM:  8/18  Height:   Ht Readings from Last 1 Encounters:  12/28/19 6\' 2"  (1.88 m)    Weight:   Wt Readings from Last 1 Encounters:  01/03/20 88 kg    BMI:  Body mass index is 24.92 kg/m.  Estimated Nutritional Needs:   Kcal:  2300-2500 kcal  Protein:  115-130 grams  Fluid:  >/= 2 L/day    01/05/20, MS, RD, LDN RD pager number and weekend/on-call pager number located in Amion.

## 2020-01-04 ENCOUNTER — Inpatient Hospital Stay (HOSPITAL_COMMUNITY): Payer: Medicare Other

## 2020-01-04 DIAGNOSIS — I5031 Acute diastolic (congestive) heart failure: Secondary | ICD-10-CM

## 2020-01-04 LAB — BASIC METABOLIC PANEL
Anion gap: 7 (ref 5–15)
BUN: 17 mg/dL (ref 6–20)
CO2: 27 mmol/L (ref 22–32)
Calcium: 8.6 mg/dL — ABNORMAL LOW (ref 8.9–10.3)
Chloride: 98 mmol/L (ref 98–111)
Creatinine, Ser: 1.2 mg/dL (ref 0.61–1.24)
GFR calc Af Amer: >60 mL/min (ref >60)
GFR calc non Af Amer: >60 mL/min (ref >60)
Glucose, Bld: 144 mg/dL — ABNORMAL HIGH (ref 70–99)
Potassium: 3.7 mmol/L (ref 3.5–5.1)
Sodium: 132 mmol/L — ABNORMAL LOW (ref 135–145)

## 2020-01-04 LAB — GLUCOSE, CAPILLARY
Glucose-Capillary: 119 mg/dL — ABNORMAL HIGH (ref 70–99)
Glucose-Capillary: 123 mg/dL — ABNORMAL HIGH (ref 70–99)
Glucose-Capillary: 104 mg/dL — ABNORMAL HIGH (ref 70–99)
Glucose-Capillary: 171 mg/dL — ABNORMAL HIGH (ref 70–99)

## 2020-01-04 LAB — ECHOCARDIOGRAM LIMITED
Height: 74 in
Weight: 3149.93 oz

## 2020-01-04 LAB — CBC
HCT: 32 % — ABNORMAL LOW (ref 39.0–52.0)
Hemoglobin: 9.7 g/dL — ABNORMAL LOW (ref 13.0–17.0)
MCH: 24.9 pg — ABNORMAL LOW (ref 26.0–34.0)
MCHC: 30.3 g/dL (ref 30.0–36.0)
MCV: 82.3 fL (ref 80.0–100.0)
Platelets: 188 10*3/uL (ref 150–400)
RBC: 3.89 MIL/uL — ABNORMAL LOW (ref 4.22–5.81)
RDW: 19.6 % — ABNORMAL HIGH (ref 11.5–15.5)
WBC: 8.1 10*3/uL (ref 4.0–10.5)
nRBC: 0 % (ref 0.0–0.2)

## 2020-01-04 LAB — COOXEMETRY PANEL
Carboxyhemoglobin: 1.7 % — ABNORMAL HIGH (ref 0.5–1.5)
Methemoglobin: 0.7 % (ref 0.0–1.5)
O2 Saturation: 66.4 %
Total hemoglobin: 10.3 g/dL — ABNORMAL LOW (ref 12.0–16.0)

## 2020-01-04 LAB — LACTATE DEHYDROGENASE: LDH: 222 U/L — ABNORMAL HIGH (ref 98–192)

## 2020-01-04 LAB — PROTIME-INR
INR: 1.9 — ABNORMAL HIGH (ref 0.8–1.2)
Prothrombin Time: 20.8 seconds — ABNORMAL HIGH (ref 11.4–15.2)

## 2020-01-04 MED ORDER — POTASSIUM CHLORIDE CRYS ER 20 MEQ PO TBCR
40.0000 meq | EXTENDED_RELEASE_TABLET | Freq: Once | ORAL | Status: AC
  Administered 2020-01-04: 40 meq via ORAL
  Filled 2020-01-04: qty 2

## 2020-01-04 MED ORDER — WARFARIN SODIUM 2.5 MG PO TABS
2.5000 mg | ORAL_TABLET | Freq: Once | ORAL | Status: AC
  Administered 2020-01-04: 2.5 mg via ORAL
  Filled 2020-01-04: qty 1

## 2020-01-04 NOTE — Plan of Care (Signed)

## 2020-01-04 NOTE — Discharge Summary (Signed)
Advanced Heart Failure Team  Discharge Summary   Patient ID: Jonathan Wood MRN: [id number], DOB/AGE: 1971/01/08 49 y.o. 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  Admitted 08/21/2018 with volume overload and anemia. Diuresed with IV lasix and transitioned to torsemide 20 mg daily. GI consulted EGD completed and showed gastritis and nonbleeding duodenal ulcers, colonoscopy with 5 polyps removed. Protonix increased to BID, he was started on PO iron,and hydrocortisone suppositories for hemorrhoid treatment. He received a total of 2 uPRBCs. He will stay off ASA forseveral weeksbut can reconsider later. Discharge weight was 221 pounds.   Admitted 12/7-15/20 for DL infection with overlying cellulitis and volume overload. Treated with IV abx and diuresis.   Recently admitted 08/16/19-09/13/19 for DL infection with large subxiphoid abscess. Underwent several debridements and eventually a pec muscle flap. Wound cx + MSSA. Initially started on ancef and rifabutin but expanded to cefipime for a week then changed to IV ancef and po rifabutin. Now on oral keflex and rifabutin  Readmitted 11/13/19-11/16/19 with superficial abscess and tachypnea. Superficial abscess I&D'd and packed. CT scan done and showed possible deeper fluid collection. Reviewed with TCTS who felt it was muscle flap. Sent to IR  for possible drainage but u/s confirmed it was the muscle flap and no drainable fluid collection. Wound cx negative. ID saw and recommended continuing Keflex.   He presented to [facility] 12[facility]27[facility] w/ complaints of severe left upper chest/ shoulder pain w/ mild associated dyspnea. In ED, he is tachycardic and tachypneic. SBP 180s. Left upper anterior chest tender to palpation. AF WBC 11.3. Initial Hgb 12.6>>9.9. SCr 1.40>>1.68. Lactic acid 6.8>>8.5>>11. LDH 282. Hs Trop 39>>37. EKG showed sinus tach 126 bpm. STAT CT of C/A/P negative for dissection. The subcutaneous fluid collection superficial to the sternum is not substantially changed from previous study. No acute abdominal process. His VAD parameters were stable.   He was admitted to CCU and started on bicarb gtt for lactic acidosis. Also developed profound hypoglycemia, given OJ and amp D50. CVC placed and found to have low co-ox at 51%. Started on 0.25 of milrinone. Co-ox improved and lactic acid normalized. Bicarb gtt discontinued. Later required addition of NE to milrinone. Required lasix gtt for diuresis. Ramp echo on 8/13 with severe RV dysfunction. AoV opening every beat. Speed increased 5600-> 6300. Tolerated ok. Repeat ramp echo on 8/20 speed increased to 6500   He diuresed well on lasix gtt, over 40 lb diuresis. Transitioned back to PO diuretics. He was weaned off NE and milrinone. Co-ox remained stable. Weight 193 on d/c. Co-ox 75%.   On day of d/c felt well. No SOB, orthopnea or PND. VAD interrogated personally. Parameters stable. INR 1.8  LVAD INTERROGATION:  HeartMate III LVAD:  Flow 6.4 liters/min, speed 6500, power 6.0, PI 2.5  Discharge Weight Range: 193lbs Discharge Vitals: Blood pressure (!) 120/97, pulse 89, temperature 98.4 F (36.9 C), temperature source Oral, resp. rate Marland Kitchen)  24, height 6\' 2"  (1.88 m), weight 87.8 kg, SpO2 98 %.  General:  NAD.  HEENT: normal  Neck: supple. JVP not elevated.  Carotids 2+ bilat; no bruits. No  lymphadenopathy or thryomegaly appreciated. Cor: LVAD hum.  Lungs: Clear. Abdomen: obese soft, nontender, non-distended. No hepatosplenomegaly. No bruits or masses. Good bowel sounds. Driveline site clean. Anchor in place.  Extremities: no cyanosis, clubbing, rash. Warm no edema  Neuro: alert & oriented x 3. No focal deficits. Moves all 4 without problem    Labs: Lab Results  Component Value Date   WBC 6.6 01/06/2020   HGB 10.4 (L) 01/06/2020   HCT 34.7 (L) 01/06/2020   MCV 81.6 01/06/2020   PLT 209 01/06/2020    Recent Labs  Lab 01/06/20 0413  NA 136  K 3.9  CL 98  CO2 30  BUN 23*  CREATININE 1.24  CALCIUM 9.1  GLUCOSE 96   Lab Results  Component Value Date   CHOL 111 08/30/2017   HDL 33 (L) 08/30/2017   LDLCALC 70 08/30/2017   TRIG 38 08/30/2017   BNP (last 3 results) Recent Labs    03/21/19 1415 04/23/19 1350  BNP 426.9* 709.6*    ProBNP (last 3 results) No results for input(s): PROBNP in the last 8760 hours.   Discharge Medications   Allergies as of 01/06/2020   No Known Allergies     Medication List    STOP taking these medications   hydrALAZINE 50 MG tablet Commonly known as: APRESOLINE   linaclotide 145 MCG Caps capsule Commonly known as: Linzess   Oxycodone HCl 10 MG Tabs   potassium chloride SA 20 MEQ tablet Commonly known as: KLOR-CON   senna-docusate 8.6-50 MG tablet Commonly known as: Senokot-S     TAKE these medications   albuterol 108 (90 Base) MCG/ACT inhaler Commonly known as: ProAir HFA Inhale 2 puffs into the lungs every 6 (six) hours as needed for wheezing or shortness of breath.   cephALEXin 500 MG capsule Commonly known as: KEFLEX Take 1 capsule (500 mg total) by mouth 3 (three) times daily.   Chlorhexidine Gluconate Cloth 2 % Pads Apply 6 each topically daily.   digoxin 0.125 MG tablet Commonly known as: LANOXIN Take 1 tablet (0.125 mg total) by mouth daily. Start taking on: January 07, 2020   docusate sodium  100 MG capsule Commonly known as: Stool Softener Take 1 capsule (100 mg total) by mouth 2 (two) times daily. What changed:   when to take this  reasons to take this   DULoxetine 60 MG capsule Commonly known as: CYMBALTA Take 1 capsule (60 mg total) by mouth daily.   gabapentin 300 MG capsule Commonly known as: NEURONTIN TAKE ONE CAPSULE BY MOUTH THREE TIMES DAILY   hydrOXYzine 25 MG tablet Commonly known as: ATARAX/VISTARIL Take 1 tablet (25 mg total) by mouth at bedtime as needed for anxiety.   multivitamin with minerals Tabs tablet Take 1 tablet by mouth daily. Start taking on: January 07, 2020   pantoprazole 40 MG tablet Commonly known as: PROTONIX Take 1 tablet (40 mg total) by mouth 2 (two) times daily.   sildenafil 20 MG tablet Commonly known as: REVATIO Take 1 tablet (20 mg total) by mouth 3 (three) times daily.   spironolactone 25 MG tablet Commonly known as: ALDACTONE Take 0.5 tablets (12.5 mg total) by mouth daily. What changed: how much to take   torsemide 20 MG tablet Commonly known as: DEMADEX Take 2 tablets (40 mg  total) by mouth daily. What changed:   how much to take  when to take this  additional instructions   traMADol 50 MG tablet Commonly known as: ULTRAM Take 2 tablets (100 mg total) by mouth every 6 (six) hours as needed for moderate pain.   warfarin 5 MG tablet Commonly known as: COUMADIN Take as directed. If you are unsure how to take this medication, talk to your nurse or doctor. Original instructions: Take 5 mg (1 tab) every Monday, Wed, and Friday and 2.5 mg (0.5 tab) all other days or as directed by HF Clinic. What changed:   how much to take  how to take this  when to take this   zinc sulfate 220 (50 Zn) MG capsule Take 1 capsule (220 mg total) by mouth daily.       Disposition   The patient will be discharged in stable condition to home. Discharge Instructions    Diet - low sodium heart healthy   Complete by: As  directed    Increase activity slowly   Complete by: As directed          Duration of Discharge Encounter: Greater than 35 minutes   Signed, Arvilla Meres, MD  2:52 PM

## 2020-01-04 NOTE — Progress Notes (Signed)
Echocardiogram 2D Echocardiogram has been performed.  Jonathan Wood 01/04/2020, 2:51 PM

## 2020-01-04 NOTE — Progress Notes (Addendum)
Advanced Heart Failure VAD Team Note  PCP-Cardiologist: Arvilla Meres, MD   Subjective:    - Ramp echo 8/13 with severe RV dysfunction Speed 5600-> 6200 - On 8/16 norepi restarted due to low CO-OX. Continued on milrinone 0.25 mcg + lasix drip. - 01/02/20 Norepi cut back 2 and VAD speed increased to 6300   Now off NE. Remains on Milrinone 0.25. Co-ox 66% today.   Diuresed 37 lb. IV Lasix discontinued, back on torsemide 40 daily. CVP 12 today      LVAD INTERROGATION:  HeartMate III LVAD:   Flow 5.9 liters/min, speed 6300, power 5.5 PI 2.7. no PI events.    VAD interrogated personally. Parameters stable.  Objective:    Vital Signs:   Temp:  [97.6 F (36.4 C)-98.2 F (36.8 C)] 97.8 F (36.6 C) (08/20 0835) Pulse Rate:  [97-108] 105 (08/20 0316) Resp:  [10-30] 20 (08/20 0835) BP: (57-106)/(41-78) 95/75 (08/20 0835) SpO2:  [95 %-100 %] 96 % (08/20 0316) Weight:  [89.3 kg] 89.3 kg (08/20 0623) Last BM Date: 01/03/20 Mean arterial Pressure 80s   Intake/Output:   Intake/Output Summary (Last 24 hours) at 01/04/2020 0914 Last data filed at 01/04/2020 0400 Gross per 24 hour  Intake 182.04 ml  Output 950 ml  Net -767.96 ml     Physical Exam   CVP 12  GENERAL: well appearing, laying in bed. No distress HEENT: normal  NECK: Supple, JVD elevated to jaw .  2+ bilaterally, no bruits.  No lymphadenopathy or thyromegaly appreciated.  + RIJ CVC CARDIAC:  Mechanical heart sounds with LVAD hum present.  LUNGS:  Clear to auscultation bilaterally. No wheezing  ABDOMEN:  Soft, round, nontender, positive bowel sounds x4.     LVAD exit site: well-healed and incorporated.  Dressing dry and intact.  No erythema or drainage.  Stabilization device present and accurately applied.  Driveline dressing is being changed daily per sterile technique. EXTREMITIES:  Warm and dry, no cyanosis, clubbing, rash or edema  NEUROLOGIC:  Alert and oriented x 3.    No aphasia.  No dysarthria.  Affect  pleasant.     Telemetry   NSR/ sinus tach low 100s   Labs   Basic Metabolic Panel: Recent Labs  Lab 12/30/19 0325 12/30/19 1410 12/31/19 0337 12/31/19 0337 01/01/20 0423 01/01/20 0423 01/01/20 0816 01/02/20 0405 01/03/20 0500 01/04/20 0232  NA 136   < > 135  --  131*  --   --  133* 135 132*  K 3.3*   < > 4.0  --  4.3  --   --  3.4* 4.0 3.7  CL 96*   < > 92*  --  88*  --   --  89* 95* 98  CO2 30   < > 33*  --  32  --   --  34* 33* 27  GLUCOSE 137*   < > 120*  --  112*  --   --  101* 134* 144*  BUN 20   < > 19  --  22*  --   --  21* 20 17  CREATININE 1.68*   < > 1.61*  --  1.50*  --   --  1.37* 1.27* 1.20  CALCIUM 8.6*   < > 8.9   < > 9.1   < >  --  9.2 9.1 8.6*  MG 1.5*  --  1.5*  --   --   --  1.8 1.6* 2.3  --    < > =  values in this interval not displayed.    Liver Function Tests: Recent Labs  Lab 12/30/19 1410  AST 33  ALT 24  ALKPHOS 100  BILITOT 2.7*  PROT 7.5  ALBUMIN 2.8*   No results for input(s): LIPASE, AMYLASE in the last 168 hours. No results for input(s): AMMONIA in the last 168 hours.  CBC: Recent Labs  Lab 12/31/19 0337 01/01/20 0423 01/02/20 0405 01/03/20 0500 01/04/20 0232  WBC 11.8* 12.2* 9.5 9.2 8.1  HGB 9.7* 9.6* 10.2* 10.1* 9.7*  HCT 30.9* 31.5* 33.9* 33.7* 32.0*  MCV 82.2 81.2 80.9 82.0 82.3  PLT 147* 152 162 184 188    INR: Recent Labs  Lab 12/31/19 0337 01/01/20 0423 01/02/20 0405 01/03/20 0500 01/04/20 0232  INR 1.5* 1.4* 1.5* 1.7* 1.9*    Other results:     Imaging   No results found.   Medications:     Scheduled Medications: . cephALEXin  500 mg Oral Q8H  . Chlorhexidine Gluconate Cloth  6 each Topical Daily  . digoxin  0.125 mg Oral Daily  . docusate sodium  100 mg Oral BID  . DULoxetine  60 mg Oral Daily  . gabapentin  300 mg Oral TID  . insulin aspart  0-6 Units Subcutaneous TID AC & HS  . multivitamin with minerals  1 tablet Oral Daily  . nystatin   Topical TID  . pantoprazole  40 mg Oral BID   . sildenafil  20 mg Oral TID  . sodium chloride flush  10-40 mL Intracatheter Q12H  . torsemide  40 mg Oral Daily  . Warfarin - Pharmacist Dosing Inpatient   Does not apply q1600  . zinc sulfate  220 mg Oral Daily    Infusions: . sodium chloride Stopped (12/30/19 1923)  . milrinone 0.25 mcg/kg/min (01/04/20 0400)    PRN Medications: sodium chloride, acetaminophen, albuterol, hydrOXYzine, sodium chloride flush, traMADol  Assessment/Plan:    1. Lactic Acidosis likely due to cardiogenic shock - now resolved. 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 NE added - Ramp echo on 8/13 with severe RV dysfunction. AoV opening every beat. Speed increased 5600-> 6300. Tolerating well.  - Norepi stopped 8/14. Maps stable. CO-OX went back down.  - On 8/16 Norepi restarted at 3 mcg and continued on milrinone 0.25 mcg.  - NE stopped again on 8/19. Remains on milrinone 0.25  - Todays Co-OX 66% . Wean milrinone to 0.125 today  - on sildenafil 20 tid and digoxin 0.125 for RV failure. Dig level ok (0.4)  - CVP 12 today. Wt up 2 lb.  - Continue torsemide 40 mg daily  - restart low dose spiro 12.5 mg today  - Renal function stable.   3.  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 Evaluated by TCTS and IR and found to be muscle flap with no draiable fluid collection 6/21 - Site looks ok today. CT of chest unremarkable, AF. - BCx negative - ID following. Was on  unasyn but switch back to Keflex on 8/15 -He is nota candidate for pump exchange given severe RV dysfunction and poor self-care  4. HM3 LVAD 09/06/2017. - Speed increased 5600 -> 6200->6300 8/19  - LDH stable 222 - INR goal 2.0-2.5.  -  INR 1.9   Continue  warfarin. Discussed dosing with PharmD personally. - Off ASA with PUD.  5.Essential HTN - MAPs stable. Continue to hold hydral  - restart spiro 12.5 today    6. OSA - sleep study with very severe OSA (AHI 69/hr) - Able to use CPAP at night.   - Needs f/u in sleep clinic. Arranged several times but he has not been compliant   7. H/o GI bleed  - 08/2018 EGD completed and showed gastritis and nonbleeding duodenal ulcers, colonoscopy with 5 polyps removed. Protonix was increased to BID.  - hgb down from 9.9>>7.4. Received RBCs - hgb stable at 9.7 - He will stay off aspirin for now.   8. AKI - Scr peaked to 1.99 (1.4 on admit).  - 2/2 HF and volume overload, improved w/ inotropes + diuresis  - Creatinine1.2 today.     9. Hypokalemia - 3.7 today  - restart spiro 12.5 mg     Length of Stay: [address] PA-C 01/04/2020, 9:14 AM  VAD Team --- VAD ISSUES ONLY--- Pager [phone] (7am - 7am)  Advanced Heart Failure Team  Pager [phone] (M-F; 7a - 4p)  Please contact [facility] for night-coverage after hours (4p -7a ) and weekends on [email or web address]  Patient seen and examined with the above-signed Advanced Practice Provider and/or Housestaff. I personally reviewed laboratory data, imaging studies and relevant notes. I independently examined the patient and formulated the important aspects of the plan. I have edited the note to reflect any of my changes or salient points. I have personally discussed the plan with the patient and/or family.  Low dose NE titrated off today. Remains on milrinone. Volume status much improved. Renal function stable.   Ramp echo done today and RV function improved. AoV now closed with every beat. Speed increased to 6500.  General:  NAD.  HEENT: normal  Neck: supple. JVP to jaw Carotids 2+ bilat; no bruits. No lymphadenopathy or thryomegaly appreciated. Cor: LVAD hum.  Lungs: Clear. Abdomen: obese soft, nontender, non-distended. No  hepatosplenomegaly. No bruits or masses. Good bowel sounds. Driveline site clean. Anchor in place.  Extremities: no cyanosis, clubbing, rash. Warm no edema  Neuro: alert & oriented x 3. No focal deficits. Moves all 4 without problem   RV seems to improving with decompression. Will continue to try and wean milrinone. Watch CVP and renal function. INR 1.9. Discussed dosing with PharmD personally.  Arvilla Meres, MD  5:22 PM

## 2020-01-04 NOTE — Progress Notes (Signed)
ANTICOAGULATION CONSULT NOTE - Follow Up Consult  Pharmacy Consult for warfarin/heparin Indication: LVAD  No Known Allergies  Patient Measurements: Height: 6\' 2"  (188 cm) Weight: 89.3 kg (196 lb 13.9 oz) IBW/kg (Calculated) : 82.2 Heparin Dosing Weight: 98.9 kg   Vital Signs: Temp: 97.8 F (36.6 C) (08/20 0835) Temp Source: Oral (08/20 0835) BP: 95/75 (08/20 0835) Pulse Rate: 105 (08/20 0316)  Labs: Recent Labs    01/02/20 0405 01/02/20 0405 01/03/20 0500 01/04/20 0232  HGB 10.2*   < > 10.1* 9.7*  HCT 33.9*  --  33.7* 32.0*  PLT 162  --  184 188  LABPROT 17.6*  --  19.1* 20.8*  INR 1.5*  --  1.7* 1.9*  HEPARINUNFRC <0.10*  --  <0.10*  --   CREATININE 1.37*  --  1.27* 1.20   < > = values in this interval not displayed.    Estimated Creatinine Clearance: 89.4 mL/min (by C-G formula based on SCr of 1.2 mg/dL).   Medical History: Past Medical History:  Diagnosis Date  . Asthma   . CHF (congestive heart failure) (HCC)    a. 09/2016: EF 20-25% with cath showing normal cors  . GERD (gastroesophageal reflux disease)   . History of hiatal hernia   . LVAD (left ventricular assist device) present (HCC)   . OSA on CPAP 09/06/2018   Severe OSA with AHI 68/hr on CPAP at 12cm H2O    Medications:  Scheduled:  . cephALEXin  500 mg Oral Q8H  . Chlorhexidine Gluconate Cloth  6 each Topical Daily  . digoxin  0.125 mg Oral Daily  . docusate sodium  100 mg Oral BID  . DULoxetine  60 mg Oral Daily  . gabapentin  300 mg Oral TID  . insulin aspart  0-6 Units Subcutaneous TID AC & HS  . multivitamin with minerals  1 tablet Oral Daily  . nystatin   Topical TID  . pantoprazole  40 mg Oral BID  . sildenafil  20 mg Oral TID  . sodium chloride flush  10-40 mL Intracatheter Q12H  . torsemide  40 mg Oral Daily  . warfarin  2.5 mg Oral ONCE-1600  . Warfarin - Pharmacist Dosing Inpatient   Does not apply q1600  . zinc sulfate  220 mg Oral Daily    Assessment: 48 yom presenting  with upper wall chest pain - has hx of muscle flap wound infection growing MSSA in past followed by ID. On warfarin for hx LVAD HM3 (LD confirmed with patient on 8/10).  PTA regimen: 2.5 mg daily except 5 mg MWF  INR still slightly subtherapeutic at 1.9 but trending up. Hgb stable ~ 10, received 2 units PRBC 8/13. Plts stable 188. LDH stable. No overt bleeding.   Goal of Therapy:  INR 2-2.5 Monitor platelets by anticoagulation protocol: Yes   Plan:  Warfarin 2.5 mg PO tonight x1 Monitor daily INR, CBC, and for s/sx of bleeding   9/13, [facility] Clinical Pharmacist  01/04/2020 12:38 PM   [facility] pharmacy phone numbers are listed on [email or web address]

## 2020-01-04 NOTE — Progress Notes (Signed)
LVAD Coordinator Rounding Note:  Admitted 12/28/19 by Dr [name] for severe left upper chest/ shoulder pain, tachycardia, and tachypnea. Lactic acid >11.   STAT CT of C/A/P negative for dissection. The subcutaneous fluid collection superficial to the sternum is not substantially changed from previous study. No acute abdominal process.   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 Laneta Simmers under Destination Therapy criteria.  Pt discussed desire to be home by 01/07/20 for his son's open house at school.  Dr. [name] increased VAD speed and would like to increase more if possible. Ramp echo ordered and scheduled for today at 2:00 pm.   Vital signs: Temp: 97.8 HR: 105 Auto BP: 95/75 (83) Doppler: 86 Sat: 96% on RA Wt: 233.9>220.2>210>197.5>194>196.8 lbs  LVAD interrogation reveals:  Speed: 6300 Flow: 5.9 Power:  5.5w PI: 2.9  Alarms: none Events: none Hematocrit: 32  Fixed speed: 6300 Low speed limit: 6000  Drive Line:  CDI. Drive line anchor re-applied. Next dressing change due 01/10/20.   Labs:  LDH trend: 252>282>283>281>239>222  INR trend: 3.0>1.5>1.4>1.5>1.7>1.9  Drips: Milrinone 0.25 mcg/kg/min  Infection: - 12/27/19 blood cx>> negative  Blood Products: -12/28/19> 2 PRBCs  Anticoagulation Plan: -INR Goal: 2.0 - 2.5  -ASA Dose: none  Adverse Events: - driveline cx 04/23/19>> staph aureus - Admitted for sternal/drive line infection. OR for debridement 08/20/19- + staph aureus wound cultures; further irrigation/debridements done 08/22/19, 08/24/19, 08/30/19, and 09/03/19. Debridement and closure of sternal wound with muscle flap 09/05/19.  - Admitted 11/13/19 for respiratory distress and wound management of subxiphoid abscess. UDS + THC & benzos.  - Admitted 12/27/19 severe left chest/shoulder pain. Profound right heart failure   Plan/Recommendations:  1. Call VAD pager if any VAD equipment or drive line issues. 2. Weekly drive line dressing changes by bedside nurse.   3.  Loaner back up equipment at bedside. 4. Ramp echo at bedside scheduled for 01/04/20 at 2:00 pm.  Carlton Adam RN VAD Coordinator  Office: [phone]  24/7 Pager: [phone]

## 2020-01-04 NOTE — Progress Notes (Signed)
Speed  Flow  PI  Power  LVIDD  AI  Aortic openings  MR  TR  Septum  RV   6300 6.0 2.4 5.5 7.25 trace 0/5  mild/mod  trace Slightly to right Mod hypo  6400  6.1 2.5 5.7 6.93 trace 0/5 Mild/mod trace midline Mod hypo   6500 6.2 2.6 5.9 6.9 trace 0/5 Mild/mod trivial midline Mod hypo   6600 6.3 2.6 6.1 6.7 trace 0/5 Mild/mod trace midline Mod hypo  6700  6.5 2.3 6.3 6.45 trace 0/5 Mild/mod trace Pulling in Mod hypo                Doppler MAP: 73   Ramp ECHO performed at bedside.   At completion of ramp study, controller programmed:  Fixed speed:6500 Low speed limit:6000  Carlton Adam RN, VAD Coordinator 24/7 pager [phone]

## 2020-01-04 NOTE — Progress Notes (Signed)
CPAP set up at bedside at 6.0 cmH20 per patient home settings. Patient states he is able to place himself on when ready. Patient aware to call for assistance if needed. RN aware.

## 2020-01-05 LAB — LACTATE DEHYDROGENASE: LDH: 240 U/L — ABNORMAL HIGH (ref 98–192)

## 2020-01-05 LAB — BASIC METABOLIC PANEL
Anion gap: 12 (ref 5–15)
BUN: 17 mg/dL (ref 6–20)
CO2: 27 mmol/L (ref 22–32)
Calcium: 8.9 mg/dL (ref 8.9–10.3)
Chloride: 98 mmol/L (ref 98–111)
Creatinine, Ser: 1.23 mg/dL (ref 0.61–1.24)
GFR calc Af Amer: >60 mL/min (ref >60)
GFR calc non Af Amer: >60 mL/min (ref >60)
Glucose, Bld: 108 mg/dL — ABNORMAL HIGH (ref 70–99)
Potassium: 3.9 mmol/L (ref 3.5–5.1)
Sodium: 137 mmol/L (ref 135–145)

## 2020-01-05 LAB — GLUCOSE, CAPILLARY
Glucose-Capillary: 113 mg/dL — ABNORMAL HIGH (ref 70–99)
Glucose-Capillary: 88 mg/dL (ref 70–99)
Glucose-Capillary: 106 mg/dL — ABNORMAL HIGH (ref 70–99)
Glucose-Capillary: 156 mg/dL — ABNORMAL HIGH (ref 70–99)

## 2020-01-05 LAB — CBC
HCT: 33.7 % — ABNORMAL LOW (ref 39.0–52.0)
Hemoglobin: 10.2 g/dL — ABNORMAL LOW (ref 13.0–17.0)
MCH: 24.8 pg — ABNORMAL LOW (ref 26.0–34.0)
MCHC: 30.3 g/dL (ref 30.0–36.0)
MCV: 82 fL (ref 80.0–100.0)
Platelets: 202 10*3/uL (ref 150–400)
RBC: 4.11 MIL/uL — ABNORMAL LOW (ref 4.22–5.81)
RDW: 19.8 % — ABNORMAL HIGH (ref 11.5–15.5)
WBC: 7.4 10*3/uL (ref 4.0–10.5)
nRBC: 0 % (ref 0.0–0.2)

## 2020-01-05 LAB — COOXEMETRY PANEL
Carboxyhemoglobin: 2.3 % — ABNORMAL HIGH (ref 0.5–1.5)
Methemoglobin: 1.2 % (ref 0.0–1.5)
O2 Saturation: 71.1 %
Total hemoglobin: 10.9 g/dL — ABNORMAL LOW (ref 12.0–16.0)

## 2020-01-05 LAB — MAGNESIUM: Magnesium: 1.9 mg/dL (ref 1.7–2.4)

## 2020-01-05 LAB — PROTIME-INR
INR: 1.9 — ABNORMAL HIGH (ref 0.8–1.2)
Prothrombin Time: 21.5 seconds — ABNORMAL HIGH (ref 11.4–15.2)

## 2020-01-05 MED ORDER — SPIRONOLACTONE 12.5 MG HALF TABLET
12.5000 mg | ORAL_TABLET | Freq: Every day | ORAL | Status: DC
  Administered 2020-01-05 – 2020-01-06 (×2): 12.5 mg via ORAL
  Filled 2020-01-05 (×2): qty 1

## 2020-01-05 MED ORDER — WARFARIN SODIUM 2.5 MG PO TABS
2.5000 mg | ORAL_TABLET | Freq: Once | ORAL | Status: AC
  Administered 2020-01-05: 2.5 mg via ORAL
  Filled 2020-01-05: qty 1

## 2020-01-05 NOTE — Progress Notes (Signed)
ANTICOAGULATION CONSULT NOTE - Follow Up Consult  Pharmacy Consult for warfarin Indication: LVAD  No Known Allergies  Patient Measurements: Height: 6\' 2"  (188 cm) Weight: 88.2 kg (194 lb 7.1 oz) IBW/kg (Calculated) : 82.2 Heparin Dosing Weight: 98.9 kg   Vital Signs: Temp: 98.1 F (36.7 C) (08/21 0900) Temp Source: Oral (08/21 0900) BP: 118/67 (08/21 0900) Pulse Rate: 96 (08/21 0900)  Labs: Recent Labs    01/03/20 0500 01/03/20 0500 01/04/20 0232 01/05/20 0409  HGB 10.1*   < > 9.7* 10.2*  HCT 33.7*  --  32.0* 33.7*  PLT 184  --  188 202  LABPROT 19.1*  --  20.8* 21.5*  INR 1.7*  --  1.9* 1.9*  HEPARINUNFRC <0.10*  --   --   --   CREATININE 1.27*  --  1.20  --    < > = values in this interval not displayed.    Estimated Creatinine Clearance: 89.4 mL/min (by C-G formula based on SCr of 1.2 mg/dL).   Medical History: Past Medical History:  Diagnosis Date  . Asthma   . CHF (congestive heart failure) (HCC)    a. 09/2016: EF 20-25% with cath showing normal cors  . GERD (gastroesophageal reflux disease)   . History of hiatal hernia   . LVAD (left ventricular assist device) present (HCC)   . OSA on CPAP 09/06/2018   Severe OSA with AHI 68/hr on CPAP at 12cm H2O    Medications:  Scheduled:  . cephALEXin  500 mg Oral Q8H  . Chlorhexidine Gluconate Cloth  6 each Topical Daily  . digoxin  0.125 mg Oral Daily  . docusate sodium  100 mg Oral BID  . DULoxetine  60 mg Oral Daily  . gabapentin  300 mg Oral TID  . insulin aspart  0-6 Units Subcutaneous TID AC & HS  . multivitamin with minerals  1 tablet Oral Daily  . nystatin   Topical TID  . pantoprazole  40 mg Oral BID  . sildenafil  20 mg Oral TID  . sodium chloride flush  10-40 mL Intracatheter Q12H  . spironolactone  12.5 mg Oral Daily  . torsemide  40 mg Oral Daily  . Warfarin - Pharmacist Dosing Inpatient   Does not apply q1600  . zinc sulfate  220 mg Oral Daily    Assessment: 48 yom presenting with upper  wall chest pain - has hx of muscle flap wound infection growing MSSA in past followed by ID. On warfarin for hx LVAD HM3 (LD confirmed with patient on 8/10).  PTA regimen: 2.5 mg daily except 5 mg MWF  INR still slightly subtherapeutic at 1.9. Hgb stable ~ 10  Goal of Therapy:  INR 2-2.5 Monitor platelets by anticoagulation protocol: Yes   Plan:  Warfarin 2.5 mg PO tonight x1 Monitor daily INR, CBC, and for s/sx of bleeding   10/10, PharmD Clinical Pharmacist **Pharmacist phone directory can now be found on [email or web address] ([email or web address]).  Listed under [facility].

## 2020-01-05 NOTE — Plan of Care (Signed)

## 2020-01-05 NOTE — Progress Notes (Signed)
Advanced Heart Failure VAD Team Note  PCP-Cardiologist: Arvilla Meres, MD   Subjective:    - Ramp echo 8/13 with severe RV dysfunction Speed 5600-> 6200 - On 8/16 norepi restarted due to low CO-OX. Continued on milrinone 0.25 mcg + lasix drip. - 01/02/20 Norepi cut back 2 and VAD speed increased to 6300  - 01/04/20 Repeat ramp echo. Speed turned up to 6500  Now off NE. Milrinone down to 0.125  Co-ox 71% today.   On torsemide 40 daily. Weight down 2 pounds  No BMET today. INR 1.9    LVAD INTERROGATION:  HeartMate III LVAD:   Flow 6.3 liters/min, speed 6500, power 6.0 PI 2.7 VAD interrogated personally. Parameters stable.  Objective:    Vital Signs:   Temp:  [97.3 F (36.3 C)-98.3 F (36.8 C)] 98.1 F (36.7 C) (08/21 0900) Pulse Rate:  [95-100] 96 (08/21 0900) Resp:  [18-24] 19 (08/21 0900) BP: (88-121)/(66-86) 118/67 (08/21 0900) SpO2:  [97 %-100 %] 100 % (08/21 0319) Weight:  [88.2 kg] 88.2 kg (08/21 0600) Last BM Date: 01/03/20 Mean arterial Pressure 80s    Intake/Output:   Intake/Output Summary (Last 24 hours) at 01/05/2020 0934 Last data filed at 01/04/2020 2310 Gross per 24 hour  Intake 480 ml  Output 2025 ml  Net -1545 ml     Physical Exam   General:  NAD.  HEENT: normal  Neck: supple. JVP not elevated.  Carotids 2+ bilat; no bruits. No lymphadenopathy or thryomegaly appreciated. Cor: LVAD hum.  Lungs: Clear. Abdomen: obese soft, nontender, non-distended. No hepatosplenomegaly. No bruits or masses. Good bowel sounds. Driveline site clean. Anchor in place.  Extremities: no cyanosis, clubbing, rash. Warm no edema  Neuro: alert & oriented x 3. No focal deficits. Moves all 4 without problem   Telemetry   NSR 90s Personally reviewed  Labs   Basic Metabolic Panel: Recent Labs  Lab 12/31/19 0337 12/31/19 0337 01/01/20 0423 01/01/20 0423 01/01/20 0816 01/02/20 0405 01/03/20 0500 01/04/20 0232 01/05/20 0409  NA 135  --  131*  --   --  133* 135  132*  --   K 4.0  --  4.3  --   --  3.4* 4.0 3.7  --   CL 92*  --  88*  --   --  89* 95* 98  --   CO2 33*  --  32  --   --  34* 33* 27  --   GLUCOSE 120*  --  112*  --   --  101* 134* 144*  --   BUN 19  --  22*  --   --  21* 20 17  --   CREATININE 1.61*  --  1.50*  --   --  1.37* 1.27* 1.20  --   CALCIUM 8.9   < > 9.1   < >  --  9.2 9.1 8.6*  --   MG 1.5*  --   --   --  1.8 1.6* 2.3  --  1.9   < > = values in this interval not displayed.    Liver Function Tests: Recent Labs  Lab 12/30/19 1410  AST 33  ALT 24  ALKPHOS 100  BILITOT 2.7*  PROT 7.5  ALBUMIN 2.8*   No results for input(s): LIPASE, AMYLASE in the last 168 hours. No results for input(s): AMMONIA in the last 168 hours.  CBC: Recent Labs  Lab 01/01/20 0423 01/02/20 0405 01/03/20 0500 01/04/20 0232 01/05/20 0409  WBC 12.2*  9.5 9.2 8.1 7.4  HGB 9.6* 10.2* 10.1* 9.7* 10.2*  HCT 31.5* 33.9* 33.7* 32.0* 33.7*  MCV 81.2 80.9 82.0 82.3 82.0  PLT 152 162 184 188 202    INR: Recent Labs  Lab 01/01/20 0423 01/02/20 0405 01/03/20 0500 01/04/20 0232 01/05/20 0409  INR 1.4* 1.5* 1.7* 1.9* 1.9*    Other results:     Imaging   ECHOCARDIOGRAM LIMITED  Result Date: 01/04/2020    ECHOCARDIOGRAM LIMITED REPORT   Patient Name:   TAIMUR FIER Date of Exam: 01/04/2020 Medical Rec #:  [id number]           Height:       74.0 in Accession #:    [id number]          Weight:       196.9 lb Date of Birth:  [birth year]          BSA:          2.159 m Patient Age:    48 years            BP:           0/0 mmHg Patient Gender: M                   HR:           91 bpm. Exam Location:  Inpatient Procedure: Limited Echo, Color Doppler and Cardiac Doppler Indications:    I50.31 Acute diastolic (congestive) heart failure  History:        Patient has prior history of Echocardiogram examinations, most                 recent 12/28/2019. CHF, COPD; Risk Factors:Sleep Apnea and                 Hypertension.  Sonographer:    Irving Burton Senior  RDCS Referring Phys: [id number] AMY D CLEGG  Sonographer Comments: Ramp study IMPRESSIONS  1. Study performed to evaluate optimal LVAD settings.  2. Speed 6300 rpm: LVEF 10-15%. LV severely dilated. Moderate-severely reduced RV systolic function, RV moderately dilated. Septum neutral. Mild-moderate MR. Trivial AI, AV does not open. Mild TR.  3. Speed 6400 rpm: LVEF 10-15%. LV moderate-severely dilated. Moderately reduced RV systolic function. Septum neutral. Moderate MR. Trivial AI, aortic valve does not open. Trivial TR.  4. Speed 6500 rpm: LVEF 10-15%. LV moderate-severely dilated. Moderately reduced RV systolic function. Septum neutral. Moderate MR. Mild AI, aortic valve does not open. Trivial TR.  5. Speed 6600 rpm: LVEF 15%. LV moderately dilated. Mild-moderately reduced RV systolic function. Septum neutral. Mild-moderate MR. Trivial AI, aortic valve does not open. Trivial TR.  6. Speed 6700 rpm: LVEF 15%. LV moderately dilated. Mild-moderately reduced RV systolic function. Septum rightward. Mild-moderate MR. Trivial AI, aortic valve does not open. Trivial TR. Conclusion(s)/Recommendation(s): Please see clinical documentation for bedside evaluation during LVAD ramp study. Weston Brass MD Electronically signed by Weston Brass MD Signature Date/Time: 01/04/2020/6:45:30 PM    Final      Medications:     Scheduled Medications: . cephALEXin  500 mg Oral Q8H  . Chlorhexidine Gluconate Cloth  6 each Topical Daily  . digoxin  0.125 mg Oral Daily  . docusate sodium  100 mg Oral BID  . DULoxetine  60 mg Oral Daily  . gabapentin  300 mg Oral TID  . insulin aspart  0-6 Units Subcutaneous TID AC & HS  . multivitamin with minerals  1 tablet Oral Daily  .  nystatin   Topical TID  . pantoprazole  40 mg Oral BID  . sildenafil  20 mg Oral TID  . sodium chloride flush  10-40 mL Intracatheter Q12H  . torsemide  40 mg Oral Daily  . Warfarin - Pharmacist Dosing Inpatient   Does not apply q1600  . zinc sulfate   220 mg Oral Daily    Infusions: . sodium chloride Stopped (12/30/19 1923)  . milrinone 0.125 mcg/kg/min (01/04/20 1405)    PRN Medications: sodium chloride, acetaminophen, albuterol, hydrOXYzine, sodium chloride flush, traMADol  Assessment/Plan:    1. Lactic Acidosis likely due to cardiogenic shock - now resolved. 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 NE added - Ramp echo on 8/13 with severe RV dysfunction. AoV opening every beat. Speed increased 5600-> 6300. Repeat reamp on 8/20 speed increased to 6500. AoV now staying closed. RV improved (mild to mod HK) Tolerating well.  - NE stopped on 8/19. Remains on milrinone 0.125  - Todays Co-OX 71% . Stop milrinone - on sildenafil 20 tid and digoxin 0.125 for RV failure. Dig level ok (0.4)  - Volume status ok.  - Continue torsemide 40 mg daily  - Continue low dose spiro 12.5 mg today  - Needs BMET  3.  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 Evaluated by TCTS and IR and found to be muscle flap with no draiable fluid collection 6/21 - Site looks ok today. CT of chest unremarkable, AF. - BCx negative - ID following. Was on  unasyn but switch back to Keflex on 8/15 -He is nota candidate for pump exchange given severe RV dysfunction and poor self-care - No change  4. HM3 LVAD 09/06/2017. - Speed increased 5600 -> 6200->6300-> 6500 - LDH stable 240 - INR goal 2.0-2.5.  - INR 1.9  Continue warfarin. Discussed dosing with PharmD personally. - Off ASA with PUD.  5.Essential HTN - MAPs stable. Continue to hold hydral  - restart spiro 12.5 today    6. OSA - sleep study with very severe OSA (AHI 69/hr) - Able to use CPAP at night.   -  Needs f/u in sleep clinic. Arranged several times but he has not been compliant   7. H/o GI bleed  - 08/2018 EGD completed and showed gastritis and nonbleeding duodenal ulcers, colonoscopy with 5 polyps removed. Protonix was increased to BID.  - hgb down from 9.9>>7.4. Received RBCs - hgb stable at 9.7 - He will stay off aspirin for now.   8. AKI - Scr peaked to 1.99 (1.4 on admit).  - 2/2 HF and volume overload, improved w/ inotropes + diuresis  - Recheck today    9. Hypokalemia - BMET pending    Length of Stay: 9  Arvilla Meres, MD 01/05/2020, 9:34 AM  VAD Team --- VAD ISSUES ONLY--- Pager [phone] (7am - 7am)  Advanced Heart Failure Team  Pager [phone] (M-F; 7a - 4p)  Please contact [facility] for night-coverage after hours (4p -7a ) and weekends on [email or web address]  Patient seen and examined with the above-signed Advanced Practice Provider and/or Housestaff. I personally reviewed laboratory data, imaging studies and relevant notes. I independently examined the patient and formulated the important aspects of the plan. I have edited the note  to reflect any of my changes or salient points. I have personally discussed the plan with the patient and/or family.  Low dose NE titrated off today. Remains on milrinone. Volume status much improved. Renal function stable.   Ramp echo done today and RV function improved. AoV now closed with every beat. Speed increased to 6500.  General:  NAD.  HEENT: normal  Neck: supple. JVP to jaw Carotids 2+ bilat; no bruits. No lymphadenopathy or thryomegaly appreciated. Cor: LVAD hum.  Lungs: Clear. Abdomen: obese soft, nontender, non-distended. No hepatosplenomegaly. No bruits or masses. Good bowel sounds. Driveline site clean. Anchor in place.  Extremities: no cyanosis, clubbing, rash. Warm no edema  Neuro: alert & oriented x 3. No focal deficits. Moves all 4 without problem   RV seems to improving with decompression. Will continue to try  and wean milrinone. Watch CVP and renal function. INR 1.9. Discussed dosing with PharmD personally.  Arvilla Meres, MD  9:34 AM

## 2020-01-06 LAB — CBC
HCT: 34.7 % — ABNORMAL LOW (ref 39.0–52.0)
Hemoglobin: 10.4 g/dL — ABNORMAL LOW (ref 13.0–17.0)
MCH: 24.5 pg — ABNORMAL LOW (ref 26.0–34.0)
MCHC: 30 g/dL (ref 30.0–36.0)
MCV: 81.6 fL (ref 80.0–100.0)
Platelets: 209 10*3/uL (ref 150–400)
RBC: 4.25 MIL/uL (ref 4.22–5.81)
RDW: 19.9 % — ABNORMAL HIGH (ref 11.5–15.5)
WBC: 6.6 10*3/uL (ref 4.0–10.5)
nRBC: 0 % (ref 0.0–0.2)

## 2020-01-06 LAB — GLUCOSE, CAPILLARY
Glucose-Capillary: 126 mg/dL — ABNORMAL HIGH (ref 70–99)
Glucose-Capillary: 136 mg/dL — ABNORMAL HIGH (ref 70–99)

## 2020-01-06 LAB — LACTATE DEHYDROGENASE: LDH: 210 U/L — ABNORMAL HIGH (ref 98–192)

## 2020-01-06 LAB — BASIC METABOLIC PANEL
Anion gap: 8 (ref 5–15)
BUN: 23 mg/dL — ABNORMAL HIGH (ref 6–20)
CO2: 30 mmol/L (ref 22–32)
Calcium: 9.1 mg/dL (ref 8.9–10.3)
Chloride: 98 mmol/L (ref 98–111)
Creatinine, Ser: 1.24 mg/dL (ref 0.61–1.24)
GFR calc Af Amer: >60 mL/min (ref >60)
GFR calc non Af Amer: >60 mL/min (ref >60)
Glucose, Bld: 96 mg/dL (ref 70–99)
Potassium: 3.9 mmol/L (ref 3.5–5.1)
Sodium: 136 mmol/L (ref 135–145)

## 2020-01-06 LAB — PROTIME-INR
INR: 1.8 — ABNORMAL HIGH (ref 0.8–1.2)
Prothrombin Time: 19.8 seconds — ABNORMAL HIGH (ref 11.4–15.2)

## 2020-01-06 LAB — COOXEMETRY PANEL
Carboxyhemoglobin: 1.8 % — ABNORMAL HIGH (ref 0.5–1.5)
Methemoglobin: 0.7 % (ref 0.0–1.5)
O2 Saturation: 75.8 %
Total hemoglobin: 11 g/dL — ABNORMAL LOW (ref 12.0–16.0)

## 2020-01-06 MED ORDER — ADULT MULTIVITAMIN W/MINERALS CH: 1.0000 | ORAL_TABLET | Freq: Every day | ORAL | 6 refills | Status: DC

## 2020-01-06 MED ORDER — TORSEMIDE 20 MG PO TABS: 40.0000 mg | ORAL_TABLET | Freq: Every day | ORAL | 3 refills | Status: DC

## 2020-01-06 MED ORDER — DIGOXIN 125 MCG PO TABS: 0.1250 mg | ORAL_TABLET | Freq: Every day | ORAL | 3 refills | Status: DC

## 2020-01-06 MED ORDER — ZINC SULFATE 220 (50 ZN) MG PO CAPS: 220.0000 mg | ORAL_CAPSULE | Freq: Every day | ORAL | 6 refills | Status: DC

## 2020-01-06 MED ORDER — DULOXETINE HCL 60 MG PO CPEP: 60.0000 mg | ORAL_CAPSULE | Freq: Every day | ORAL | 3 refills | Status: DC

## 2020-01-06 MED ORDER — PANTOPRAZOLE SODIUM 40 MG PO TBEC: 40.0000 mg | DELAYED_RELEASE_TABLET | Freq: Two times a day (BID) | ORAL | 6 refills | Status: DC

## 2020-01-06 MED ORDER — HYDROXYZINE HCL 25 MG PO TABS: 25.0000 mg | ORAL_TABLET | Freq: Every evening | ORAL | 0 refills | Status: DC | PRN

## 2020-01-06 MED ORDER — SPIRONOLACTONE 25 MG PO TABS: 12.5000 mg | ORAL_TABLET | Freq: Every day | ORAL | 3 refills | Status: DC

## 2020-01-06 MED ORDER — CHLORHEXIDINE GLUCONATE CLOTH 2 % EX PADS: 6.0000 | MEDICATED_PAD | Freq: Every day | CUTANEOUS | 3 refills | Status: DC

## 2020-01-06 NOTE — Progress Notes (Signed)
Discharge instructions reviewed with patient and patient verbalized understanding.  A written copy was given to patient along with a written prescription.  Patient placed himself on batteries and was taken via wheelchair to entrance where car is waiting in stable condition.

## 2020-01-07 ENCOUNTER — Telehealth: Payer: Self-pay

## 2020-01-07 NOTE — Telephone Encounter (Signed)
Transition Care Management Follow-up Telephone Call Date of discharge and from where: 01/06/2020, [facility].  Call placed to patient # [phone], message left with call back requested to this CM # [phone]. Call placed to # [phone], phone just rings unable to leave message.   Need to inquire if patient would like to schedule follow up with PCP. He already has VAD appointment 01/14/2020,

## 2020-01-08 ENCOUNTER — Telehealth: Payer: Self-pay

## 2020-01-08 NOTE — Telephone Encounter (Signed)
Transition Care Management Follow-up Telephone Call  Date of discharge and from where: 01/06/2020, [facility]  How have you been since you were released from the hospital? He said he is just fine.   Any questions or concerns?  none at this time  Items Reviewed:  Did the pt receive and understand the discharge instructions provided?  yes  Medications obtained and verified?  he said he has all medications, including the new ones and did not have any questions about the med regime  Any new allergies since your discharge?  none reported   Do you have support at home?  yes, he said he has help  No home health or DME ordered. He is requesting a cane  Functional Questionnaire: (I = Independent and D = Dependent) ADLs: he said he is mostly independent.  Manages LVAD independently  Follow up appointments reviewed:   PCP Hospital f/u appt confirmed?  He said he will call to schedule an appointment  Specialist Hospital f/u appt confirmed?  VAD appointment 01/14/2020  If their condition worsens, is the pt aware to call PCP or go to the Emergency Dept.?  yes  Was the patient provided with contact information for the PCP's office or ED?  he said he has the clinic phone number  Was to pt encouraged to call back with questions or concerns?  yes

## 2020-01-10 ENCOUNTER — Other Ambulatory Visit: Payer: Self-pay | Admitting: Nurse Practitioner

## 2020-01-10 ENCOUNTER — Other Ambulatory Visit (HOSPITAL_COMMUNITY): Payer: Self-pay | Admitting: *Deleted

## 2020-01-10 DIAGNOSIS — Z95811 Presence of heart assist device: Secondary | ICD-10-CM

## 2020-01-10 DIAGNOSIS — Z7901 Long term (current) use of anticoagulants: Secondary | ICD-10-CM

## 2020-01-10 MED ORDER — CANE MISC: 0 refills | Status: DC

## 2020-01-10 NOTE — Telephone Encounter (Signed)
Cane prescription sent to [facility]

## 2020-01-14 ENCOUNTER — Ambulatory Visit (HOSPITAL_COMMUNITY): Payer: Self-pay | Admitting: Pharmacist

## 2020-01-14 ENCOUNTER — Other Ambulatory Visit: Payer: Self-pay

## 2020-01-14 ENCOUNTER — Telehealth (HOSPITAL_COMMUNITY): Payer: Self-pay | Admitting: *Deleted

## 2020-01-14 ENCOUNTER — Encounter (HOSPITAL_COMMUNITY): Payer: Self-pay

## 2020-01-14 ENCOUNTER — Ambulatory Visit (HOSPITAL_COMMUNITY)
Admit: 2020-01-14 | Discharge: 2020-01-14 | Disposition: A | Discharge status: Home / Self Care | Payer: Medicare Other | Source: Ambulatory Visit | Attending: Internal Medicine | Admitting: Internal Medicine

## 2020-01-14 ENCOUNTER — Other Ambulatory Visit (HOSPITAL_COMMUNITY): Payer: Self-pay | Admitting: *Deleted

## 2020-01-14 VITALS — BP 120/87 | HR 101 | Temp 98.0°F | Ht 74.0 in | Wt 198.0 lb

## 2020-01-14 DIAGNOSIS — Z79899 Other long term (current) drug therapy: Secondary | ICD-10-CM | POA: Insufficient documentation

## 2020-01-14 DIAGNOSIS — Z7901 Long term (current) use of anticoagulants: Secondary | ICD-10-CM

## 2020-01-14 DIAGNOSIS — Z9119 Patient's noncompliance with other medical treatment and regimen: Secondary | ICD-10-CM | POA: Diagnosis not present

## 2020-01-14 DIAGNOSIS — Z8711 Personal history of peptic ulcer disease: Secondary | ICD-10-CM | POA: Diagnosis not present

## 2020-01-14 DIAGNOSIS — K219 Gastro-esophageal reflux disease without esophagitis: Secondary | ICD-10-CM | POA: Diagnosis not present

## 2020-01-14 DIAGNOSIS — G4733 Obstructive sleep apnea (adult) (pediatric): Secondary | ICD-10-CM | POA: Diagnosis not present

## 2020-01-14 DIAGNOSIS — Z4509 Encounter for adjustment and management of other cardiac device: Secondary | ICD-10-CM | POA: Insufficient documentation

## 2020-01-14 DIAGNOSIS — R17 Unspecified jaundice: Secondary | ICD-10-CM | POA: Insufficient documentation

## 2020-01-14 DIAGNOSIS — Y838 Other surgical procedures as the cause of abnormal reaction of the patient, or of later complication, without mention of misadventure at the time of the procedure: Secondary | ICD-10-CM | POA: Insufficient documentation

## 2020-01-14 DIAGNOSIS — I5043 Acute on chronic combined systolic (congestive) and diastolic (congestive) heart failure: Secondary | ICD-10-CM

## 2020-01-14 DIAGNOSIS — Z8249 Family history of ischemic heart disease and other diseases of the circulatory system: Secondary | ICD-10-CM | POA: Insufficient documentation

## 2020-01-14 DIAGNOSIS — T827XXD Infection and inflammatory reaction due to other cardiac and vascular devices, implants and grafts, subsequent encounter: Secondary | ICD-10-CM | POA: Diagnosis not present

## 2020-01-14 DIAGNOSIS — F17210 Nicotine dependence, cigarettes, uncomplicated: Secondary | ICD-10-CM | POA: Diagnosis not present

## 2020-01-14 DIAGNOSIS — I5023 Acute on chronic systolic (congestive) heart failure: Secondary | ICD-10-CM | POA: Insufficient documentation

## 2020-01-14 DIAGNOSIS — I110 Hypertensive heart disease with heart failure: Secondary | ICD-10-CM | POA: Insufficient documentation

## 2020-01-14 DIAGNOSIS — F3289 Other specified depressive episodes: Secondary | ICD-10-CM

## 2020-01-14 DIAGNOSIS — F329 Major depressive disorder, single episode, unspecified: Secondary | ICD-10-CM | POA: Diagnosis not present

## 2020-01-14 DIAGNOSIS — Z95811 Presence of heart assist device: Secondary | ICD-10-CM | POA: Diagnosis not present

## 2020-01-14 DIAGNOSIS — F419 Anxiety disorder, unspecified: Secondary | ICD-10-CM

## 2020-01-14 DIAGNOSIS — I5042 Chronic combined systolic (congestive) and diastolic (congestive) heart failure: Secondary | ICD-10-CM

## 2020-01-14 DIAGNOSIS — R770 Abnormality of albumin: Secondary | ICD-10-CM | POA: Insufficient documentation

## 2020-01-14 LAB — CBC
HCT: 35.3 % — ABNORMAL LOW (ref 39.0–52.0)
Hemoglobin: 10.6 g/dL — ABNORMAL LOW (ref 13.0–17.0)
MCH: 25.2 pg — ABNORMAL LOW (ref 26.0–34.0)
MCHC: 30 g/dL (ref 30.0–36.0)
MCV: 83.8 fL (ref 80.0–100.0)
Platelets: 200 10*3/uL (ref 150–400)
RBC: 4.21 MIL/uL — ABNORMAL LOW (ref 4.22–5.81)
RDW: 19.4 % — ABNORMAL HIGH (ref 11.5–15.5)
WBC: 6 10*3/uL (ref 4.0–10.5)
nRBC: 0 % (ref 0.0–0.2)

## 2020-01-14 LAB — COMPREHENSIVE METABOLIC PANEL
ALT: 23 U/L (ref 0–44)
AST: 31 U/L (ref 15–41)
Albumin: 3.2 g/dL — ABNORMAL LOW (ref 3.5–5.0)
Alkaline Phosphatase: 141 U/L — ABNORMAL HIGH (ref 38–126)
Anion gap: 9 (ref 5–15)
BUN: 9 mg/dL (ref 6–20)
CO2: 23 mmol/L (ref 22–32)
Calcium: 9.3 mg/dL (ref 8.9–10.3)
Chloride: 105 mmol/L (ref 98–111)
Creatinine, Ser: 0.87 mg/dL (ref 0.61–1.24)
GFR calc Af Amer: >60 mL/min (ref >60)
GFR calc non Af Amer: >60 mL/min (ref >60)
Glucose, Bld: 111 mg/dL — ABNORMAL HIGH (ref 70–99)
Potassium: 3.7 mmol/L (ref 3.5–5.1)
Sodium: 137 mmol/L (ref 135–145)
Total Bilirubin: 1.6 mg/dL — ABNORMAL HIGH (ref 0.3–1.2)
Total Protein: 8.5 g/dL — ABNORMAL HIGH (ref 6.5–8.1)

## 2020-01-14 LAB — PROTIME-INR
INR: 1.9 — ABNORMAL HIGH (ref 0.8–1.2)
Prothrombin Time: 20.8 seconds — ABNORMAL HIGH (ref 11.4–15.2)

## 2020-01-14 LAB — LACTATE DEHYDROGENASE: LDH: 183 U/L (ref 98–192)

## 2020-01-14 LAB — DIGOXIN LEVEL: Digoxin Level: <0.2 ng/mL — ABNORMAL LOW (ref 0.8–2.0)

## 2020-01-14 MED ORDER — TORSEMIDE 20 MG PO TABS: 20.0000 mg | ORAL_TABLET | Freq: Every day | ORAL | 3 refills | Status: DC

## 2020-01-14 MED ORDER — AMOXICILLIN 500 MG PO TABS: ORAL_TABLET | ORAL | 0 refills | Status: DC

## 2020-01-14 MED ORDER — HYDROXYZINE HCL 25 MG PO TABS: 50.0000 mg | ORAL_TABLET | Freq: Every evening | ORAL | 11 refills | Status: DC | PRN

## 2020-01-14 MED ORDER — DULOXETINE HCL 30 MG PO CPEP: ORAL_CAPSULE | ORAL | 0 refills | Status: DC

## 2020-01-14 NOTE — Progress Notes (Addendum)
VAD Clinic Note   Date:  01/14/2020   ID:  Axcel, Horsch 1970-12-21, MRN [id number]  Location: Home  Provider location: [place] [facility] Type of Visit: Established patient  PCP: [name]  Cardiologist:  Arvilla Meres, MD Primary HF: Dr [name]   Chief Complaint: Heart Failure/LVAD  History of Present Illness:  Jonathan Wood is a 49 y.o. 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  Admitted 08/21/2018 with volume overload and anemia. Diuresed with IV lasix and transitioned to torsemide 20 mg daily. GI consulted EGD completed and showed gastritis and nonbleeding duodenal ulcers, colonoscopy with 5 polyps removed. Protonix increased to BID, he was started on PO iron, and hydrocortisone suppositories for hemorrhoid treatment. He received a total of 2 uPRBCs. He will stay off ASA for several weeks but can reconsider later.  Discharge weight was 221 pounds.   Admitted 12/7-15/20 for DL infection with overlying cellulitis and volume overload. Treated with IV abx and diuresis.   Recently admitted 08/16/19-09/13/19 for DL infection with large subxiphoid abscess. Underwent several debridements and eventually a pec muscle flap. Wound cx + MSSA. Initially started on ancef and rifabutin but expanded to cefipime for a week then changed to IV ancef and po rifabutin. Now on oral keflex and rifabutin  Readmitted 11/13/19-11/16/19 with superficial abscess and tachypnea. Superficial abscess I&D'd and packed. CT scan done and showed possible deeper fluid collection. Reviewed with TCTS who felt it was muscle flap. Sent to IR for possible drainage but u/s confirmed it was the muscle flap and no drainable fluid collection. Wound cx negative. ID saw and recommended continuing Keflex.   Admitted 8/12-22/21 with lactic acidosis due to cardiogenic shock. Started on 0.25 of milrinone  and NE. Ramp echo on 8/13 with severe RV dysfunction. Speed increased 5600->6300. Tolerated ok. Repeat ramp echo on 8/20 speed increased to 6500. Diuresed over 40 pounds. Returns today for f/u with his mother. Feels great. Breathing much better. No orthopnea or PND. Much more active. Weight 198.   VAD Indication: Destination Therapy   VAD interrogation & Equipment Management: Speed: 6500 Flow: 6.3 Power: 6.0w PI: 2.1 Hct: 35  Alarms: multiple  low voltage advisories; pt admits to sleeping on batteries; confirms he will start using MPU Events: rare  Primary Controller: Replace back up battery in 30 months Back up controller: Replace back up battery in 64months I reviewed the LVAD parameters from todayand compared the results to the patient's prior recorded data.LVAD interrogation wasNEGATIVE for significant power changes, POSITIVE for clinicalalarms and STABLE for PI events/speed drops. No programming changes were madeand pump is functioning within specified parameters.    Past Medical History:  Diagnosis Date  . Asthma   . CHF (congestive heart failure) (HCC)    a. 09/2016: EF 20-25% with cath showing normal cors  . GERD (gastroesophageal reflux disease)   . History of hiatal hernia   . LVAD (left ventricular assist device) present (HCC)   . OSA on CPAP 09/06/2018   Severe OSA with AHI 68/hr on CPAP at 12cm H2O   Past Surgical History:  Procedure Laterality Date  . APPLICATION OF A-CELL OF CHEST/ABDOMEN N/A 08/24/2019   Procedure: Application Of A-Cell Of Chest/Abdomen;  Surgeon: Kerin Perna, MD;  Location: [facility] OR;  Service: Thoracic;  Laterality: N/A;  . APPLICATION OF A-CELL OF CHEST/ABDOMEN N/A 09/05/2019   Procedure:  Application Of A-Cell Of Chest/Abdomen;  Surgeon: Peggye Form, DO;  Location: [facility] OR;  Service: Plastics;  Laterality: N/A;  . APPLICATION OF A-CELL OF CHEST/ABDOMEN  09/03/2019   Procedure: Application Of A-Cell Of Chest/Abdomen;   Surgeon: Kerin Perna, MD;  Location: [facility] OR;  Service: Thoracic;;  . APPLICATION OF WOUND VAC N/A 08/22/2019   Procedure: Debridement, Irrigation and Packing of Abdominal Incision.;  Surgeon: Kerin Perna, MD;  Location: [facility] OR;  Service: Thoracic;  Laterality: N/A;  . APPLICATION OF WOUND VAC N/A 08/24/2019   Procedure: Irrigation and Debridement with WOUND VAC APPLICATION;  Surgeon: Kerin Perna, MD;  Location: [facility] OR;  Service: Thoracic;  Laterality: N/A;  . APPLICATION OF WOUND VAC N/A 08/30/2019   Procedure: APPLICATION OF WOUND VAC;  Surgeon: Kerin Perna, MD;  Location: [facility] OR;  Service: Thoracic;  Laterality: N/A;  . APPLICATION OF WOUND VAC N/A 09/03/2019   Procedure: WOUND VAC CHANGE;  Surgeon: Kerin Perna, MD;  Location: [facility] OR;  Service: Thoracic;  Laterality: N/A;  . BIOPSY  08/23/2018   Procedure: BIOPSY;  Surgeon: Lemar Lofty., MD;  Location: [facility] ENDOSCOPY;  Service: Gastroenterology;;  . COLONOSCOPY N/A 08/23/2018   Procedure: COLONOSCOPY;  Surgeon: Lemar Lofty., MD;  Location: [facility] ENDOSCOPY;  Service: Gastroenterology;  Laterality: N/A;  . ENTEROSCOPY N/A 08/23/2018   Procedure: ENTEROSCOPY;  Surgeon: Meridee Score Netty Starring., MD;  Location: [facility] ENDOSCOPY;  Service: Gastroenterology;  Laterality: N/A;  . HEMOSTASIS CLIP PLACEMENT  08/23/2018   Procedure: HEMOSTASIS CLIP PLACEMENT;  Surgeon: Lemar Lofty., MD;  Location: [facility] ENDOSCOPY;  Service: Gastroenterology;;  . IABP INSERTION N/A 09/05/2017   Procedure: IABP INSERTION;  Surgeon: Dolores Patty, MD;  Location: [facility] INVASIVE CV LAB;  Service: Cardiovascular;  Laterality: N/A;  . INSERTION OF IMPLANTABLE LEFT VENTRICULAR ASSIST DEVICE N/A 09/06/2017   Procedure: INSERTION OF IMPLANTABLE LEFT VENTRICULAR ASSIST DEVICE - HM3;  Surgeon: Kerin Perna, MD;  Location: [facility] OR;  Service: Open Heart Surgery;  Laterality: N/A;  HM3  . MUSCLE FLAP CLOSURE N/A 09/05/2019   Procedure: MUSCLE FLAP CLOSURE;   Surgeon: Peggye Form, DO;  Location: [facility] OR;  Service: Plastics;  Laterality: N/A;  . NASAL FRACTURE SURGERY  1987  . POLYPECTOMY  08/23/2018   Procedure: POLYPECTOMY;  Surgeon: Mansouraty, Netty Starring., MD;  Location: [facility] ENDOSCOPY;  Service: Gastroenterology;;  . RIGHT HEART CATH N/A 08/30/2017   Procedure: RIGHT HEART CATH;  Surgeon: Dolores Patty, MD;  Location: [facility] INVASIVE CV LAB;  Service: Cardiovascular;  Laterality: N/A;  . RIGHT/LEFT HEART CATH AND CORONARY ANGIOGRAPHY N/A 10/08/2016   Procedure: Right/Left Heart Cath and Coronary Angiography;  Surgeon: Orpah Cobb, MD;  Location: [facility] INVASIVE CV LAB;  Service: Cardiovascular;  Laterality: N/A;  . STERNAL WOUND DEBRIDEMENT N/A 08/20/2019   Procedure: DEBRIDEMENT OF LVAD DRIVELINE TUNNEL;  Surgeon: Kerin Perna, MD;  Location: [facility] OR;  Service: Thoracic;  Laterality: N/A;  . STERNAL WOUND DEBRIDEMENT N/A 09/05/2019   Procedure: DEBRIDEMENT AND CLOSURE OF ABDOMINAL WOUND;  Surgeon: Peggye Form, DO;  Location: [facility] OR;  Service: Plastics;  Laterality: N/A;  . SUBMUCOSAL TATTOO INJECTION  08/23/2018   Procedure: SUBMUCOSAL TATTOO INJECTION;  Surgeon: Lemar Lofty., MD;  Location: [facility] ENDOSCOPY;  Service: Gastroenterology;;  . TEE WITHOUT CARDIOVERSION N/A 09/06/2017   Procedure: TRANSESOPHAGEAL ECHOCARDIOGRAM (TEE);  Surgeon: Donata Clay, Theron Arista, MD;  Location: [facility] OR;  Service: Open Heart Surgery;  Laterality: N/A;  . WOUND  DEBRIDEMENT N/A 08/30/2019   Procedure: Debridement Abdominal Wound;  Surgeon: Kerin Perna, MD;  Location: [facility] OR;  Service: Thoracic;  Laterality: N/A;     Current Outpatient Medications  Medication Sig Dispense Refill  . cephALEXin (KEFLEX) 500 MG capsule Take 1 capsule (500 mg total) by mouth 3 (three) times daily. 90 capsule 11  . docusate sodium (STOOL SOFTENER) 100 MG capsule Take 1 capsule (100 mg total) by mouth 2 (two) times daily. (Patient taking differently: Take 100 mg by mouth daily  as needed for mild constipation. ) 60 capsule 11  . DULoxetine (CYMBALTA) 60 MG capsule Take 1 capsule (60 mg total) by mouth daily. 30 capsule 3  . gabapentin (NEURONTIN) 300 MG capsule TAKE ONE CAPSULE BY MOUTH THREE TIMES DAILY (Patient taking differently: Take 300 mg by mouth 3 (three) times daily. ) 90 capsule 6  . Multiple Vitamin (MULTIVITAMIN WITH MINERALS) TABS tablet Take 1 tablet by mouth daily. 30 tablet 6  . pantoprazole (PROTONIX) 40 MG tablet Take 1 tablet (40 mg total) by mouth 2 (two) times daily. 30 tablet 6  . rifabutin (MYCOBUTIN) 150 MG capsule Take 300 mg by mouth daily.    . sildenafil (REVATIO) 20 MG tablet Take 1 tablet (20 mg total) by mouth 3 (three) times daily. 90 tablet 11  . torsemide (DEMADEX) 20 MG tablet Take 1 tablet (20 mg total) by mouth daily. 90 tablet 3  . warfarin (COUMADIN) 5 MG tablet Take 5 mg (1 tab) every Monday, Wed, and Friday and 2.5 mg (0.5 tab) all other days or as directed by HF Clinic. (Patient taking differently: Take 5 mg by mouth See admin instructions. Take 5 mg (1 tab) every Monday, Wed, and Friday and 2.5 mg (0.5 tab) all other days or as directed by HF Clinic.) 180 tablet 3  . albuterol (PROAIR HFA) 108 (90 Base) MCG/ACT inhaler Inhale 2 puffs into the lungs every 6 (six) hours as needed for wheezing or shortness of breath. (Patient not taking: Reported on 01/14/2020) 18 g 3  . amoxicillin (AMOXIL) 500 MG tablet Take 2000 mg 30 mins to 1 hr prior to dental appointment 12 tablet 0  . digoxin (LANOXIN) 0.125 MG tablet Take 1 tablet (0.125 mg total) by mouth daily. (Patient not taking: Reported on 01/14/2020) 30 tablet 3  . hydrOXYzine (ATARAX/VISTARIL) 25 MG tablet Take 2 tablets (50 mg total) by mouth at bedtime as needed for anxiety. 60 tablet 11  . Misc. Devices (CANE) MISC Please provide patient with insurance approved cane ICD10 I50.42 (Patient not taking: Reported on 01/14/2020) 1 each 0  . spironolactone (ALDACTONE) 25 MG tablet Take 0.5  tablets (12.5 mg total) by mouth daily. (Patient not taking: Reported on 01/14/2020) 30 tablet 3  . traMADol (ULTRAM) 50 MG tablet Take 2 tablets (100 mg total) by mouth every 6 (six) hours as needed for moderate pain. (Patient not taking: Reported on 11/23/2019) 40 tablet 0  . zinc sulfate 220 (50 Zn) MG capsule Take 1 capsule (220 mg total) by mouth daily. (Patient not taking: Reported on 01/14/2020) 30 capsule 6   No current facility-administered medications for this encounter.    Allergies:   Patient has no known allergies.   Social History:  The patient  reports that he has quit smoking. His smoking use included cigarettes. He has a 12.50 pack-year smoking history. He has never used smokeless tobacco. He reports previous alcohol use. He reports previous drug use. Drug: Marijuana.   Family History:  The patient's family history includes Alcohol abuse in his cousin; Hypertension in his father.   ROS:  Please see the history of present illness.   All other systems are personally reviewed and negative.     Vital Signs: Temp: 98 Doppler Pressure: 90 Automatc BP: 120/87 (106) HR: 98 SPO2: UTO % RA  Weight: 198  lbs w/ eqt Hospital d/c wt: 196.8 lbs w/o equip  General:  NAD.   HEENT: normal  Neck: supple. JVP 7-8.  Carotids 2+ bilat; no bruits. No lymphadenopathy or thryomegaly appreciated. Cor: LVAD hum.  Lungs: Clear. Abdomen: soft, nontender, non-distended. No hepatosplenomegaly. No bruits or masses. Good bowel sounds. Driveline site clean. Anchor in place.  Extremities: no cyanosis, clubbing, rash. Warm no edema  Neuro: alert & oriented x 3. No focal deficits. Moves all 4 without problem   Recent Labs: 04/23/2019: B Natriuretic Peptide 709.6 08/13/2019: TSH 0.209 12/30/2019: ALT 24 01/05/2020: Magnesium 1.9 01/06/2020: BUN 23; Creatinine, Ser 1.24; Hemoglobin 10.4; Platelets 209; Potassium 3.9; Sodium 136    Wt Readings from Last 3 Encounters:  01/06/20 87.8 kg (193 lb 9 oz)    11/23/19 99.2 kg (218 lb 12.8 oz)  11/15/19 96.7 kg (213 lb 3 oz)      ASSESSMENT AND PLAN:  1. Acute on Chronic systolic HF with prominent RV failure - EF 10% s/p HM-3 LVAD on 09/06/17 - Admitted 8/21 for cardiogenic shock with severe RV failure. Treated with milrinone and then weaned off.  Diuresed 40 pounds. VAD speed increased 5600 -> 6300 - Now much improved. NYHA II  - Volume status ok - Continue torsemide 20 daily.   2. Recurrent DL infection with subxiphoid abscess - s/p debridement and pec flap in 4/21 - remains on keflex - had recurrent superficial subxiphoid abscess. CT with posible fluid collection. Evaluated by TCTS And IR and found to be muscle flap with no draiable fluid collection 6/21  - Site looks good today. Continue Keflex - He is not a candidate for pump exchange given severe RV dysfunction and poor self-care  3. HM3 LVAD 09/06/2017.  - VAD interrogated personally. Parameters stable. - LDH 183 - INR 1.9 goal 2.0-2.5  Discussed dosing with PharmD personally. - Off ASA with PUD.  - hgb 10.6 (up from 9.9)  4. Depression - Improved today - Following with psych. Starting Lexapro and hydroxyzine  5.  Essential HTN - MAPs look ok - He has been off spiro. Will restart  - Had AKI with Entresto in past  6. OSA - sleep study with very severe OSA (AHI 69/hr) - continues to refuse therapy. We have arranged f/u in sleep clinic several times but he has not been compliant - Hopefully will improve with weight loss.  - Consider retesting.  7. H/o GI bleed  - 08/2018 EGD completed and showed gastritis and nonbleeding duodenal ulcers, colonoscopy with 5 polyps removed. Protonix was increased to BID.  - No recent GI bleeding - hgb 10.6 - He will stay off aspirin for now.   8. Tobacco Abuse  - continues to smoke 2-3 cigs/day. - reinforced need to quit  9. Elevated bilirubin and low pre-albumin - suspected related to ETOH use and RV failure +/- fatty liver - Bili  1.6 today (improved)  Total time spent 40 minutes. Over half that time spent discussing above.   Signed, Arvilla Meres, MD  01/14/2020 11:53 AM  [facility] [address] [facility] [place] [address] [phone]  office) [phone] (fax)

## 2020-01-14 NOTE — Progress Notes (Signed)
Patient presents for hospital d/c f/u in VAD clinic today with his mom. Denies any issues with his VAD or alarms.   Pt ambulated to clinic visit today. He denies any complaints, reports he has lost weight since hospital discharge. He reports he has been taking Torsemide 20 mg on Mon and Fri only.   Patient confirms he stopped hydralazine, Linzess, oxy, potassium, and is taking senna as needed only as directed at hospital discharge.  He also reports he is not taking Aldactone 12.5 mg or Digoxin as directed. He says he is taking Rifabutin 300 mg daily as directed by Dr. Orvan Falconer 09/26/19 and has never stopped taking. He is taking the Keflex 500 mg three times daily as directed.   Per Rexene Alberts, NP Infectious Disease, pt instructed to stop Rifabutin daily and remain on Keflex 500 mg three times daily.  Pt also reports he had virtual visit with Dr. Hinton Dyer on 01/01/20 where pt was instructed to start Lexapro 10 mg daily and increase his hydroyzine to 50 mg at hs for anxiety. He says he has not started because he wanted to check with Dr. [name]. Reviewed starting Lexapro along with Cymbalta 60 mg daily with [name], PharmD. She wants to confirm with Dr. Hinton Dyer he is aware pt is on Cymbalta.   Pt reports he has dental appointment tomorrow. Prophylactic antibiotic given with instructions on how to take 30 mins - 1 hr prior to dental procedure.   Vital Signs: Temp: 98 Doppler Pressure: 90 Automatc BP: 120/87 (106) HR: 98 SPO2: UTO % RA  Weight: 198  lbs w/ eqt Hospital d/c wt: 196.8 lbs w/o equip   VAD Indication: Destination Therapy   VAD interrogation & Equipment Management: Speed: 6500 Flow: 6.3 Power: 6.0w    PI: 2.1 Hct: 35  Alarms: multiple  low voltage advisories; pt admits to sleeping on batteries; confirms he will start using MPU Events: rare  Primary Controller:  Replace back up battery in 30 months Back up controller:   Replace back up battery in 7   months  I reviewed the LVAD parameters from today and compared the results to the patient's prior recorded data.  LVAD interrogation was NEGATIVE for significant power changes, POSITIVE for clinical alarms and STABLE for PI events/speed drops. No programming changes were made and pump is functioning within specified parameters.   Exit Site Care: Drive line is being maintained weekly by VAD coordinators. Existing VAD dressing removed and site care performed using sterile technique. Drive line exit site cleaned with Chlora prep applicators x 2, allowed to dry, and Sorbaview dressing with biopatch reapplied. Anchor reapplied. Drive line exit site incorporated, the velour is fully implanted at exit site. No drainage, erythema, tenderness, or foul odor noted.  Pt denies fever or chills. Continue weekly dressing changes. VAD coordinators to perform weekly dressing changes. Reinforced to mother and pt if dressing is coming off prior to dressing change appointment it MUST be changed. Patient has adequate dressing supplies at home.   Device:none  BP & Labs:  Doppler 90 - reflecting MAP  Hgb 10.6 - No S/S of bleeding. Specifically denies melena/BRBPR or nosebleeds.  LDH 183 - established baseline of 200-300. Denies tea-colored urine. No power elevations noted on interrogation.   Patient Instructions:  1. Stop Rifubutin (antibiotic) 2. Start Lexapro 10 mg daily (anti-depressant/anti-anxiety) unless otherwise instructed per [name], PharmD. Will check with Dr. Benjamine Mola on his plan for your anti-depressant.  3. Increase Hydroxyzine to 50 mg (anxiety) 4.  Resume Spironolactone 12.5 mg (1/2 tab) daily 5. Increase Torsemide to 20 mg daily 6. Take Amoxicillin (antibiotic) 30 mins to 1 hr prior to your dental appointment.  7. Return to VAD clinic in 6 wks. 8. Return for lab and dressing change in one week.   Hessie Diener RN VAD Coordinator  Office: [phone]  24/7 Pager: [phone]

## 2020-01-14 NOTE — Progress Notes (Signed)
LVAD INR 

## 2020-01-14 NOTE — Telephone Encounter (Signed)
Called patient per [name], PharmD. He is to start taking duloxetine (Cymbalta) 30 mg daily for one week (Rx sent to local pharmacy, he cannot half his current pills). He is to hold off starting Lexapro 10 mg daily for now.   We will see him next week for dressing change, if he feels OK, she will stop duloxetine and start Lexapro at that time.  Pt verbalized understanding of above.   Hessie Diener RN, VAD Coordinator [phone]

## 2020-01-14 NOTE — Patient Instructions (Addendum)
1. Stop Rifubutin (antibiotic) 2. Start Lexapro 10 mg daily (anti-depressant/anti-anxiety) 3. Increase Hydroxyzine to 50 mg (anxiety) 4. Resume Spironolactone 12.5 mg (1/2 tab) daily 5. Increase Torsemide to 20 mg daily 6. Take Amoxicillin (antibiotic) 30 mins to 1 hr prior to your dental appointment.  7. Return to VAD clinic in 6 wks. 8. Return for lab and dressing change in one week.

## 2020-01-17 ENCOUNTER — Other Ambulatory Visit (HOSPITAL_COMMUNITY): Payer: Self-pay | Admitting: *Deleted

## 2020-01-17 DIAGNOSIS — Z7901 Long term (current) use of anticoagulants: Secondary | ICD-10-CM

## 2020-01-17 DIAGNOSIS — Z95811 Presence of heart assist device: Secondary | ICD-10-CM

## 2020-01-22 ENCOUNTER — Ambulatory Visit (HOSPITAL_COMMUNITY): Payer: Self-pay | Admitting: Pharmacist

## 2020-01-22 ENCOUNTER — Other Ambulatory Visit: Payer: Self-pay

## 2020-01-22 ENCOUNTER — Ambulatory Visit (HOSPITAL_COMMUNITY)
Admission: RE | Admit: 2020-01-22 | Discharge: 2020-01-22 | Disposition: A | Discharge status: Home / Self Care | Payer: Medicare Other | Source: Ambulatory Visit | Attending: Internal Medicine | Admitting: Internal Medicine

## 2020-01-22 DIAGNOSIS — Z95811 Presence of heart assist device: Secondary | ICD-10-CM | POA: Insufficient documentation

## 2020-01-22 DIAGNOSIS — Z7901 Long term (current) use of anticoagulants: Secondary | ICD-10-CM

## 2020-01-22 LAB — PROTIME-INR
INR: 2.2 — ABNORMAL HIGH (ref 0.8–1.2)
Prothrombin Time: 23.2 seconds — ABNORMAL HIGH (ref 11.4–15.2)

## 2020-01-22 NOTE — Progress Notes (Signed)
Exit Site Care: Drive line is being maintained weekly by VAD coordinators. Existing VAD dressing falling off / taped in place. NO ANCHOR present. Existing VAD dressing removed and site care performed using sterile technique. Drive line exit site cleaned with Chlora prep applicators x 2, allowed to dry, and Sorbaview dressing with biopatch reapplied. Anchor reapplied x 2. Drive line exit site incorporated, the velour is fully implanted at exit site. No drainage, erythema, tenderness, or foul odor noted.  Pt denies fever or chills.   Continue weekly dressing changes. VAD coordinators to perform weekly dressing changes. Reinforced to mother and pt if dressing (or anchor) is coming off prior to dressing change appointment it MUST be changed. Patient provided with 8 weekly dressing kits and 5 anchors for home use. Instructed to bring kit to clinic each week for dressing change.   Last clinic visit it was discussed that he would decrease his Cymbalta to 30 mg daily for 7 days. He did not make this medication change, and states "I did not know anything about it." Discussed reason for decreased Cymbalta dose (to taper down to convert to Lexapro) and he verbalized understanding. Instructed to pick up decreased dose of Cymbalta on his way home, and take daily for the next week. If he feels ok at his clinic appointment next week, will stop Cymbalta and start Lexapro.   Return to clinic in 1 week for INR & dressing change.   Emerson Monte RN [place] Coordinator  Office: [phone]  24/7 Pager: [phone]

## 2020-01-22 NOTE — Progress Notes (Signed)
LVAD INR 

## 2020-01-23 ENCOUNTER — Other Ambulatory Visit (HOSPITAL_COMMUNITY): Payer: Self-pay | Admitting: *Deleted

## 2020-01-23 DIAGNOSIS — Z95811 Presence of heart assist device: Secondary | ICD-10-CM

## 2020-01-23 DIAGNOSIS — Z7901 Long term (current) use of anticoagulants: Secondary | ICD-10-CM

## 2020-01-28 ENCOUNTER — Other Ambulatory Visit (HOSPITAL_COMMUNITY): Payer: Self-pay | Admitting: Internal Medicine

## 2020-01-28 DIAGNOSIS — F3289 Other specified depressive episodes: Secondary | ICD-10-CM

## 2020-01-29 ENCOUNTER — Ambulatory Visit (HOSPITAL_COMMUNITY)
Admission: RE | Admit: 2020-01-29 | Discharge: 2020-01-29 | Disposition: A | Discharge status: Home / Self Care | Payer: Medicare Other | Source: Ambulatory Visit | Attending: Cardiology | Admitting: Cardiology

## 2020-01-29 ENCOUNTER — Ambulatory Visit (HOSPITAL_COMMUNITY): Payer: Self-pay | Admitting: Pharmacist

## 2020-01-29 ENCOUNTER — Other Ambulatory Visit: Payer: Self-pay

## 2020-01-29 DIAGNOSIS — Z7901 Long term (current) use of anticoagulants: Secondary | ICD-10-CM | POA: Insufficient documentation

## 2020-01-29 DIAGNOSIS — Z95811 Presence of heart assist device: Secondary | ICD-10-CM | POA: Diagnosis not present

## 2020-01-29 LAB — PROTIME-INR
INR: 2.8 — ABNORMAL HIGH (ref 0.8–1.2)
Prothrombin Time: 28.3 seconds — ABNORMAL HIGH (ref 11.4–15.2)

## 2020-01-29 NOTE — Addendum Note (Signed)
Addended by: Jonne Ply on: 01/29/2020 02:35 PM   Modules accepted: Orders

## 2020-01-29 NOTE — Progress Notes (Signed)
Exit Site Care: Drive line is being maintained weekly by VAD coordinators. Existing VAD dressing falling off / taped in place. Existing VAD dressing removed and site care performed using sterile technique. Drive line exit site cleaned with Chlora prep applicators x 2, allowed to dry, and Sorbaview dressing with biopatch reapplied. Anchor reapplied x 2. Drive line exit site incorporated, the velour is fully implanted at exit site. No drainage, erythema, tenderness, or foul odor noted.  Pt denies fever or chills.   Continue weekly dressing changes. VAD coordinators to perform weekly dressing changes. Reinforced to mother and pt if dressing (or anchor) is coming off prior to dressing change appointment it MUST be changed. Pt has sufficient kits at home.   Last clinic visit it was discussed that he would decrease his Cymbalta to 30 mg daily for 7 days. He did not make this medication change, because he states that the pharmacy did not have the medication. Discussed reason for decreased Cymbalta dose (to taper down to convert to Lexapro) and he verbalized understanding.This prescription was never actually sent to the pts pharmacy. VAD coordinator sent script to pharmacy today. Pt will need script for Lexapro sent at dressing change appt next week.  Pt states that he has been having pain in his left pinky toe for the last week. Pt states he thinks "a string may be wrapped around it." Toe was assessed and found to be very swollen. Pt had a string wrapped around the base of the toe occluding blood flow, this string was embedded into the skin. The string was removed partially, pt did not tolerate this well. Tonye Becket, NP called to the bedside for assistance. Amy was able to remove the string entirely from the toe. A dry 2 x 2 was placed in between and around the toe to keep it dry. There is a small open area around the toe from the pressure of the string. Pt was instrcuted to keep this area dry and clean.   Return  to clinic in 1 week for INR & dressing change. Lexapro script will need to be sent to the pts pharmacy.  Carlton Adam RN VAD Coordinator  Office: [phone]  24/7 Pager: [phone]

## 2020-01-31 ENCOUNTER — Encounter (HOSPITAL_COMMUNITY): Payer: Medicaid Other

## 2020-01-31 ENCOUNTER — Other Ambulatory Visit (HOSPITAL_COMMUNITY): Payer: Self-pay | Admitting: *Deleted

## 2020-01-31 DIAGNOSIS — Z95811 Presence of heart assist device: Secondary | ICD-10-CM

## 2020-01-31 DIAGNOSIS — Z7901 Long term (current) use of anticoagulants: Secondary | ICD-10-CM

## 2020-02-04 ENCOUNTER — Other Ambulatory Visit: Payer: Self-pay

## 2020-02-04 ENCOUNTER — Telehealth (HOSPITAL_COMMUNITY): Payer: Medicare Other | Admitting: Psychiatry

## 2020-02-05 ENCOUNTER — Telehealth (HOSPITAL_COMMUNITY): Payer: Self-pay | Admitting: Pharmacist

## 2020-02-05 ENCOUNTER — Ambulatory Visit (HOSPITAL_COMMUNITY)
Admission: RE | Admit: 2020-02-05 | Discharge: 2020-02-05 | Disposition: A | Discharge status: Home / Self Care | Payer: Medicare Other | Source: Ambulatory Visit | Attending: Cardiology | Admitting: Cardiology

## 2020-02-05 ENCOUNTER — Other Ambulatory Visit: Payer: Self-pay

## 2020-02-05 ENCOUNTER — Ambulatory Visit (HOSPITAL_COMMUNITY): Payer: Self-pay | Admitting: Pharmacist

## 2020-02-05 DIAGNOSIS — Z95811 Presence of heart assist device: Secondary | ICD-10-CM

## 2020-02-05 DIAGNOSIS — I5043 Acute on chronic combined systolic (congestive) and diastolic (congestive) heart failure: Secondary | ICD-10-CM

## 2020-02-05 DIAGNOSIS — Z7901 Long term (current) use of anticoagulants: Secondary | ICD-10-CM | POA: Insufficient documentation

## 2020-02-05 DIAGNOSIS — F3289 Other specified depressive episodes: Secondary | ICD-10-CM

## 2020-02-05 DIAGNOSIS — I5042 Chronic combined systolic (congestive) and diastolic (congestive) heart failure: Secondary | ICD-10-CM

## 2020-02-05 DIAGNOSIS — F419 Anxiety disorder, unspecified: Secondary | ICD-10-CM

## 2020-02-05 LAB — PROTIME-INR
INR: 1.4 — ABNORMAL HIGH (ref 0.8–1.2)
Prothrombin Time: 16.4 seconds — ABNORMAL HIGH (ref 11.4–15.2)

## 2020-02-05 MED ORDER — ENOXAPARIN SODIUM 40 MG/0.4ML ~~LOC~~ SOLN: 40.0000 mg | Freq: Two times a day (BID) | SUBCUTANEOUS | 0 refills | Status: DC

## 2020-02-05 MED ORDER — TORSEMIDE 20 MG PO TABS: 20.0000 mg | ORAL_TABLET | Freq: Every day | ORAL | 3 refills | Status: DC

## 2020-02-05 MED ORDER — SILDENAFIL CITRATE 20 MG PO TABS: 20.0000 mg | ORAL_TABLET | Freq: Three times a day (TID) | ORAL | 11 refills | Status: DC

## 2020-02-05 NOTE — Telephone Encounter (Signed)
Patient Advocate Encounter   Received notification from [facility] that prior authorization for sildenafil is required.   PA submitted on CoverMyMeds Key [id number] Status is pending   Will continue to follow.  [name], PharmD, BCPS, BCCP, CPP Heart Failure Clinic Pharmacist [phone]

## 2020-02-05 NOTE — Progress Notes (Signed)
Exit Site Care: Drive line is being maintained weekly by VAD coordinators. Existing VAD dressing falling off / no anchor in place. Existing VAD dressing removed and site care performed using sterile technique. Drive line exit site cleaned with Chlora prep applicators x 2, allowed to dry, and Sorbaview dressing with biopatch reapplied. Anchor reapplied x 2. Covered with 3 large tegaderms. Drive line exit site incorporated, the velour is fully implanted at exit site. No drainage, erythema, tenderness, or foul odor noted.  Pt denies fever or chills.   Continue weekly dressing changes. VAD coordinators to perform weekly dressing changes. Reinforced to mother and pt if dressing (or anchor) is coming off prior to dressing change appointment it MUST be changed. Pt provided with 5 anchors (as most of his dressing supplies are at his mother's house) for home use. Otherwise has adequate dressing supplies at home.    Last clinic visit it was discussed that he would decrease his Cymbalta to 30 mg daily for 7 days. He states he has "3-4 more days to go on" on this taper. Instructed patient to call VAD coordinators when he completes Cymbalta taper and we will send in script for Lexapro. He verbalized understanding.   Refills sent for Sildenafil and Torsemide today.   Return to clinic in 1 week for INR & dressing change. Lexapro script will need to be sent to the pts pharmacy when he completes Cymbalta taper.  Alyce Pagan RN VAD Coordinator  Office: [phone]  24/7 Pager: [phone]

## 2020-02-05 NOTE — Progress Notes (Signed)
LVAD INR 

## 2020-02-06 ENCOUNTER — Other Ambulatory Visit (HOSPITAL_COMMUNITY): Payer: Self-pay | Admitting: *Deleted

## 2020-02-06 DIAGNOSIS — Z95811 Presence of heart assist device: Secondary | ICD-10-CM

## 2020-02-06 DIAGNOSIS — Z7901 Long term (current) use of anticoagulants: Secondary | ICD-10-CM

## 2020-02-07 NOTE — Telephone Encounter (Signed)
Advanced Heart Failure Patient Advocate Encounter  Prior Authorization for sildenafil has been approved.    Effective dates: 02/07/20 through 05/16/20  [name], PharmD, BCPS, BCCP, CPP Heart Failure Clinic Pharmacist [phone]

## 2020-02-08 ENCOUNTER — Other Ambulatory Visit (HOSPITAL_COMMUNITY): Payer: Self-pay | Admitting: *Deleted

## 2020-02-08 DIAGNOSIS — F419 Anxiety disorder, unspecified: Secondary | ICD-10-CM

## 2020-02-08 DIAGNOSIS — Z95811 Presence of heart assist device: Secondary | ICD-10-CM

## 2020-02-08 DIAGNOSIS — F3289 Other specified depressive episodes: Secondary | ICD-10-CM

## 2020-02-08 MED ORDER — AMOXICILLIN 500 MG PO TABS: 2000.0000 mg | ORAL_TABLET | Freq: Once | ORAL | 0 refills | Status: AC

## 2020-02-08 NOTE — Progress Notes (Signed)
Received call from patient requesting pre-dental procedure antibiotics and to reschedule INR & dressing change to Monday. Appt rescheduled to Monday at 10:00, and antibiotic sent in to [facility] per patient request. Pt verbalized understanding.   Alyce Pagan RN VAD Coordinator  Office: [phone]  24/7 Pager: [phone]

## 2020-02-11 ENCOUNTER — Other Ambulatory Visit (HOSPITAL_COMMUNITY): Payer: Medicare Other

## 2020-02-12 ENCOUNTER — Telehealth (HOSPITAL_COMMUNITY): Payer: Self-pay | Admitting: Licensed Clinical Social Worker

## 2020-02-12 ENCOUNTER — Other Ambulatory Visit (HOSPITAL_COMMUNITY): Payer: Medicare Other

## 2020-02-12 NOTE — Telephone Encounter (Signed)
CSW contacted patient to follow up on MyChart activation. CSW confirmed username and patient will complete via his e-mail. Patient grateful for the assistance. CSW available as needed. Lasandra Beech, LCSW, CCSW-MCS [phone]

## 2020-02-13 ENCOUNTER — Ambulatory Visit (HOSPITAL_COMMUNITY)
Admission: RE | Admit: 2020-02-13 | Discharge: 2020-02-13 | Disposition: A | Discharge status: Home / Self Care | Payer: Medicare Other | Source: Ambulatory Visit | Attending: Cardiology | Admitting: Cardiology

## 2020-02-13 ENCOUNTER — Ambulatory Visit (HOSPITAL_COMMUNITY): Payer: Self-pay | Admitting: Pharmacist

## 2020-02-13 ENCOUNTER — Other Ambulatory Visit: Payer: Self-pay

## 2020-02-13 DIAGNOSIS — Z95811 Presence of heart assist device: Secondary | ICD-10-CM | POA: Insufficient documentation

## 2020-02-13 DIAGNOSIS — Z7901 Long term (current) use of anticoagulants: Secondary | ICD-10-CM | POA: Diagnosis not present

## 2020-02-13 LAB — PROTIME-INR
INR: 1.5 — ABNORMAL HIGH (ref 0.8–1.2)
Prothrombin Time: 17.1 seconds — ABNORMAL HIGH (ref 11.4–15.2)

## 2020-02-13 NOTE — Progress Notes (Signed)
Exit Site Care: Drive line is being maintained weekly by VAD coordinators. Existing VAD dressing falling off / taped in place. Existing VAD dressing removed and site care performed using sterile technique. Drive line exit site cleaned with Chlora prep applicators x 2, allowed to dry, and Sorbaview dressing with biopatch reapplied. Anchor reapplied x 2. Drive line exit site incorporated, the velour is fully implanted at exit site. No drainage, erythema, tenderness, or foul odor noted.  Pt denies fever or chills.   Return to clinic in 1 week for INR & dressing change.   Hessie Diener RN VAD Coordinator  Office: [phone]  24/7 Pager: [phone]

## 2020-02-13 NOTE — Progress Notes (Signed)
LVAD INR 

## 2020-02-15 ENCOUNTER — Other Ambulatory Visit (HOSPITAL_COMMUNITY): Payer: Self-pay | Admitting: Unknown Physician Specialty

## 2020-02-15 DIAGNOSIS — Z7901 Long term (current) use of anticoagulants: Secondary | ICD-10-CM

## 2020-02-15 DIAGNOSIS — Z95811 Presence of heart assist device: Secondary | ICD-10-CM

## 2020-02-18 ENCOUNTER — Ambulatory Visit: Payer: Medicaid Other | Admitting: Nurse Practitioner

## 2020-02-20 ENCOUNTER — Ambulatory Visit (HOSPITAL_COMMUNITY): Payer: Self-pay | Admitting: Pharmacist

## 2020-02-20 ENCOUNTER — Other Ambulatory Visit (HOSPITAL_COMMUNITY): Payer: Self-pay | Admitting: *Deleted

## 2020-02-20 ENCOUNTER — Other Ambulatory Visit: Payer: Self-pay

## 2020-02-20 ENCOUNTER — Other Ambulatory Visit (HOSPITAL_COMMUNITY): Payer: Medicare Other

## 2020-02-20 ENCOUNTER — Ambulatory Visit (HOSPITAL_COMMUNITY)
Admission: RE | Admit: 2020-02-20 | Discharge: 2020-02-20 | Disposition: A | Discharge status: Home / Self Care | Payer: Medicare Other | Source: Ambulatory Visit | Attending: Internal Medicine | Admitting: Internal Medicine

## 2020-02-20 DIAGNOSIS — Z7901 Long term (current) use of anticoagulants: Secondary | ICD-10-CM

## 2020-02-20 DIAGNOSIS — Z4800 Encounter for change or removal of nonsurgical wound dressing: Secondary | ICD-10-CM | POA: Diagnosis not present

## 2020-02-20 DIAGNOSIS — Z95811 Presence of heart assist device: Secondary | ICD-10-CM

## 2020-02-20 LAB — PROTIME-INR
INR: 3.1 — ABNORMAL HIGH (ref 0.8–1.2)
Prothrombin Time: 30.8 seconds — ABNORMAL HIGH (ref 11.4–15.2)

## 2020-02-20 NOTE — Progress Notes (Signed)
Pt presents for drive line care and INR.   Exit Site Care: Drive line is being maintained weekly by VAD coordinators. Existing VAD dressing, anchor, and large tegaderm intact. Existing VAD dressing removed and site care performed using sterile technique. Drive line exit site cleaned with Chlora prep applicators x 2, allowed to dry, rinsed with sterile saline, and Sorbaview dressing with biopatch reapplied. Anchor reapplied; all covered with 2 large tegaderm dressings. Drive line exit site incorporated, the velour is fully implanted at exit site. No drainage, erythema, tenderness, or foul odor noted.  Pt denies fever or chills. Provided patient with 3 weekly dressing kits and asked him to bring one to each clinic visit this month.   Return to clinic in 1 week for INR & dressing change.   Hessie Diener RN VAD Coordinator  Office: [phone]  24/7 Pager: [phone]

## 2020-02-20 NOTE — Progress Notes (Signed)
LVAD INR 

## 2020-02-22 ENCOUNTER — Other Ambulatory Visit (HOSPITAL_COMMUNITY): Payer: Self-pay | Admitting: Unknown Physician Specialty

## 2020-02-22 DIAGNOSIS — Z7901 Long term (current) use of anticoagulants: Secondary | ICD-10-CM

## 2020-02-22 DIAGNOSIS — Z95811 Presence of heart assist device: Secondary | ICD-10-CM

## 2020-02-27 ENCOUNTER — Ambulatory Visit (HOSPITAL_COMMUNITY): Payer: Self-pay | Admitting: Pharmacist

## 2020-02-27 ENCOUNTER — Other Ambulatory Visit: Payer: Self-pay

## 2020-02-27 ENCOUNTER — Ambulatory Visit (HOSPITAL_COMMUNITY)
Admission: RE | Admit: 2020-02-27 | Discharge: 2020-02-27 | Disposition: A | Discharge status: Home / Self Care | Payer: Medicare Other | Source: Ambulatory Visit | Attending: Cardiology | Admitting: Cardiology

## 2020-02-27 ENCOUNTER — Institutional Professional Consult (permissible substitution): Payer: Medicare Other | Admitting: Pulmonary Disease

## 2020-02-27 DIAGNOSIS — Z4801 Encounter for change or removal of surgical wound dressing: Secondary | ICD-10-CM | POA: Diagnosis not present

## 2020-02-27 DIAGNOSIS — F419 Anxiety disorder, unspecified: Secondary | ICD-10-CM

## 2020-02-27 DIAGNOSIS — R791 Abnormal coagulation profile: Secondary | ICD-10-CM | POA: Insufficient documentation

## 2020-02-27 DIAGNOSIS — Z95811 Presence of heart assist device: Secondary | ICD-10-CM | POA: Insufficient documentation

## 2020-02-27 DIAGNOSIS — F3289 Other specified depressive episodes: Secondary | ICD-10-CM

## 2020-02-27 DIAGNOSIS — Z7901 Long term (current) use of anticoagulants: Secondary | ICD-10-CM

## 2020-02-27 LAB — PROTIME-INR
INR: 3.1 — ABNORMAL HIGH (ref 0.8–1.2)
Prothrombin Time: 31 seconds — ABNORMAL HIGH (ref 11.4–15.2)

## 2020-02-27 MED ORDER — SILDENAFIL CITRATE 20 MG PO TABS: 20.0000 mg | ORAL_TABLET | Freq: Three times a day (TID) | ORAL | 11 refills | Status: DC

## 2020-02-27 NOTE — Progress Notes (Signed)
Pt presents for drive line care and INR.   Exit Site Care: Drive line is being maintained weekly by VAD coordinators. Existing VAD dressing, anchor, and large tegaderm starting to pull away from skin. Existing VAD dressing removed and site care performed using sterile technique. Drive line exit site cleaned with Chlora prep applicators x 2, allowed to dry, rinsed with sterile saline, and Sorbaview dressing with biopatch reapplied. Anchor reapplied; all covered with 2 large tegaderm dressings. Drive line exit site incorporated, the velour is fully implanted at exit site. No drainage, erythema, tenderness, or foul odor noted.  Pt denies fever or chills. Pt has adequate dressing supplies at home.   Return to clinic in 1 week for INR & dressing change.   Alyce Pagan RN VAD Coordinator  Office: [phone]  24/7 Pager: [phone]

## 2020-02-27 NOTE — Progress Notes (Signed)
LVAD INR 

## 2020-02-28 NOTE — Progress Notes (Signed)
CSW met with patient in the clinic. Patient reports he received his approval and back pay form [employer]. Patient in good spirits and feeling like he can manage his finances once again. CSW offered support and discussed financial budgeting. Patient verbalizes understanding and will reach out if needed. [name], [place], [place]

## 2020-02-29 ENCOUNTER — Other Ambulatory Visit (HOSPITAL_COMMUNITY): Payer: Self-pay | Admitting: Unknown Physician Specialty

## 2020-02-29 DIAGNOSIS — Z95811 Presence of heart assist device: Secondary | ICD-10-CM

## 2020-02-29 DIAGNOSIS — Z7901 Long term (current) use of anticoagulants: Secondary | ICD-10-CM

## 2020-03-03 ENCOUNTER — Encounter (HOSPITAL_COMMUNITY): Payer: Medicaid Other

## 2020-03-05 ENCOUNTER — Ambulatory Visit (HOSPITAL_COMMUNITY)
Admission: RE | Admit: 2020-03-05 | Discharge: 2020-03-05 | Disposition: A | Discharge status: Home / Self Care | Payer: Medicare Other | Source: Ambulatory Visit | Attending: Cardiology | Admitting: Cardiology

## 2020-03-05 ENCOUNTER — Other Ambulatory Visit: Payer: Self-pay

## 2020-03-05 ENCOUNTER — Ambulatory Visit (HOSPITAL_COMMUNITY): Payer: Self-pay | Admitting: Pharmacist

## 2020-03-05 DIAGNOSIS — Z7901 Long term (current) use of anticoagulants: Secondary | ICD-10-CM | POA: Diagnosis not present

## 2020-03-05 DIAGNOSIS — Z95811 Presence of heart assist device: Secondary | ICD-10-CM | POA: Diagnosis not present

## 2020-03-05 LAB — PROTIME-INR
INR: 1.5 — ABNORMAL HIGH (ref 0.8–1.2)
Prothrombin Time: 17.2 seconds — ABNORMAL HIGH (ref 11.4–15.2)

## 2020-03-05 NOTE — Addendum Note (Signed)
Encounter addended by: Levonne Spiller, RN on: 03/05/2020 10:40 AM  Actions taken: Clinical Note Signed

## 2020-03-05 NOTE — Progress Notes (Addendum)
Pt presents for drive line care and INR.   Pt reports he saw a dentist yesterday with recommendation to have 6 teeth extracted. Says the dentist says his INR can be between 2 - 3.5 and wants to perform in his office in December 2021. Asked pt to bring dentist name and contact info to next visit so we can contact and confirm plan with Dr. [name]. Pt verbalized agreement to same.   Exit Site Care: Drive line is being maintained weekly by VAD coordinators. Existing VAD dressing, anchor, and large tegaderm starting to pull away from skin. Existing VAD dressing removed and site care performed using sterile technique. Drive line exit site cleaned with Chlora prep applicators x 2, allowed to dry, rinsed with sterile saline, and Sorbaview dressing with biopatch reapplied. Anchor reapplied; all covered with 2 large tegaderm dressings. Drive line exit site incorporated, the velour is fully implanted at exit site. No drainage, erythema, tenderness, or foul odor noted.  Pt denies fever or chills. Pt has adequate dressing supplies at home.   Return to clinic in 1 week for 6 week f/u in VAD Clinic.   Hessie Diener RN VAD Coordinator  Office: [phone]  24/7 Pager: [phone]

## 2020-03-05 NOTE — Progress Notes (Signed)
LVAD INR 

## 2020-03-10 ENCOUNTER — Other Ambulatory Visit (HOSPITAL_COMMUNITY): Payer: Self-pay | Admitting: *Deleted

## 2020-03-10 DIAGNOSIS — Z95811 Presence of heart assist device: Secondary | ICD-10-CM

## 2020-03-10 DIAGNOSIS — I5043 Acute on chronic combined systolic (congestive) and diastolic (congestive) heart failure: Secondary | ICD-10-CM

## 2020-03-10 DIAGNOSIS — Z7901 Long term (current) use of anticoagulants: Secondary | ICD-10-CM

## 2020-03-12 ENCOUNTER — Ambulatory Visit (HOSPITAL_COMMUNITY): Payer: Self-pay | Admitting: Pharmacist

## 2020-03-12 ENCOUNTER — Encounter (HOSPITAL_COMMUNITY): Payer: Self-pay

## 2020-03-12 ENCOUNTER — Ambulatory Visit (HOSPITAL_COMMUNITY)
Admission: RE | Admit: 2020-03-12 | Discharge: 2020-03-12 | Disposition: A | Discharge status: Home / Self Care | Payer: Medicare Other | Source: Ambulatory Visit | Attending: Cardiology | Admitting: Cardiology

## 2020-03-12 ENCOUNTER — Telehealth (HOSPITAL_COMMUNITY): Payer: Self-pay | Admitting: Pharmacy Technician

## 2020-03-12 ENCOUNTER — Other Ambulatory Visit: Payer: Self-pay

## 2020-03-12 DIAGNOSIS — Z452 Encounter for adjustment and management of vascular access device: Secondary | ICD-10-CM | POA: Insufficient documentation

## 2020-03-12 DIAGNOSIS — Z95811 Presence of heart assist device: Secondary | ICD-10-CM | POA: Insufficient documentation

## 2020-03-12 DIAGNOSIS — Z7901 Long term (current) use of anticoagulants: Secondary | ICD-10-CM

## 2020-03-12 DIAGNOSIS — I5043 Acute on chronic combined systolic (congestive) and diastolic (congestive) heart failure: Secondary | ICD-10-CM

## 2020-03-12 DIAGNOSIS — Z79899 Other long term (current) drug therapy: Secondary | ICD-10-CM | POA: Insufficient documentation

## 2020-03-12 DIAGNOSIS — K859 Acute pancreatitis without necrosis or infection, unspecified: Secondary | ICD-10-CM | POA: Diagnosis not present

## 2020-03-12 LAB — COMPREHENSIVE METABOLIC PANEL
ALT: 13 U/L (ref 0–44)
AST: 29 U/L (ref 15–41)
Albumin: 3.3 g/dL — ABNORMAL LOW (ref 3.5–5.0)
Alkaline Phosphatase: 118 U/L (ref 38–126)
Anion gap: 13 (ref 5–15)
BUN: 9 mg/dL (ref 6–20)
CO2: 20 mmol/L — ABNORMAL LOW (ref 22–32)
Calcium: 8.6 mg/dL — ABNORMAL LOW (ref 8.9–10.3)
Chloride: 105 mmol/L (ref 98–111)
Creatinine, Ser: 0.98 mg/dL (ref 0.61–1.24)
GFR, Estimated: >60 mL/min (ref >60)
Glucose, Bld: 120 mg/dL — ABNORMAL HIGH (ref 70–99)
Potassium: 3.7 mmol/L (ref 3.5–5.1)
Sodium: 138 mmol/L (ref 135–145)
Total Bilirubin: 0.9 mg/dL (ref 0.3–1.2)
Total Protein: 7.3 g/dL (ref 6.5–8.1)

## 2020-03-12 LAB — CBC
HCT: 40.2 % (ref 39.0–52.0)
Hemoglobin: 12.3 g/dL — ABNORMAL LOW (ref 13.0–17.0)
MCH: 26.5 pg (ref 26.0–34.0)
MCHC: 30.6 g/dL (ref 30.0–36.0)
MCV: 86.5 fL (ref 80.0–100.0)
Platelets: 173 10*3/uL (ref 150–400)
RBC: 4.65 MIL/uL (ref 4.22–5.81)
RDW: 19.8 % — ABNORMAL HIGH (ref 11.5–15.5)
WBC: 6.2 10*3/uL (ref 4.0–10.5)
nRBC: 0 % (ref 0.0–0.2)

## 2020-03-12 LAB — DIGOXIN LEVEL: Digoxin Level: <0.2 ng/mL — ABNORMAL LOW (ref 1.0–2.0)

## 2020-03-12 LAB — PROTIME-INR
INR: 2.8 — ABNORMAL HIGH (ref 0.8–1.2)
Prothrombin Time: 28.6 seconds — ABNORMAL HIGH (ref 11.4–15.2)

## 2020-03-12 LAB — LACTATE DEHYDROGENASE: LDH: 342 U/L — ABNORMAL HIGH (ref 98–192)

## 2020-03-12 NOTE — Progress Notes (Signed)
VAD Clinic Note   Date:  03/12/2020   ID:  Jonathan Wood, DOB 1971-04-19, MRN [id number]  Location: Home  Provider location: [place] [facility] Type of Visit: Established patient  PCP: [name]  Cardiologist:  Arvilla Meres, MD Primary HF: Dr [name]   Chief Complaint: Heart Failure/LVAD  History of Present Illness:  Jonathan Wood is a 49 y.o. 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  Admitted 08/21/2018 with volume overload and anemia. Diuresed with IV lasix and transitioned to torsemide 20 mg daily. GI consulted EGD completed and showed gastritis and nonbleeding duodenal ulcers, colonoscopy with 5 polyps removed. Protonix increased to BID, he was started on PO iron, and hydrocortisone suppositories for hemorrhoid treatment. He received a total of 2 uPRBCs. He will stay off ASA for several weeks but can reconsider later.  Discharge weight was 221 pounds.   Admitted 12/7-15/20 for DL infection with overlying cellulitis and volume overload. Treated with IV abx and diuresis.   Recently admitted 08/16/19-09/13/19 for DL infection with large subxiphoid abscess. Underwent several debridements and eventually a pec muscle flap. Wound cx + MSSA. Initially started on ancef and rifabutin but expanded to cefipime for a week then changed to IV ancef and po rifabutin. Now on oral keflex and rifabutin  Readmitted 11/13/19-11/16/19 with superficial abscess and tachypnea. Superficial abscess I&D'd and packed. CT scan done and showed possible deeper fluid collection. Reviewed with TCTS who felt it was muscle flap. Sent to IR for possible drainage but u/s confirmed it was the muscle flap and no drainable fluid collection. Wound cx negative. ID saw and recommended continuing Keflex.   Admitted 8/12-22/21 with lactic acidosis due to cardiogenic shock. Started on 0.25 of milrinone  and NE. Ramp echo on 8/13 with severe RV dysfunction. Speed increased 5600->6300. Tolerated ok. Repeat ramp echo on 8/20 speed increased to 6500. Diuresed over 40 pounds.   Here for routine VAD f/u. Says he is feeling pretty good but is tearful a lot. Cymbalta stopped by his psychiatrist a while back with plan to start lexapro but this was not done. Also not taking spiro or sildenafil. He cut torsemide from 20 daily to 20 qod. Denies orthopnea or PND. No fevers, chills or problems with driveline. No bleeding, melena or neuro symptoms. No VAD alarms. Weight up 10 pounds.       Past Medical History:  Diagnosis Date  . Asthma   . CHF (congestive heart failure) (HCC)    a. 09/2016: EF 20-25% with cath showing normal cors  . GERD (gastroesophageal reflux disease)   . History of hiatal hernia   . LVAD (left ventricular assist device) present (HCC)   . OSA on CPAP 09/06/2018   Severe OSA with AHI 68/hr on CPAP at 12cm H2O   Past Surgical History:  Procedure Laterality Date  . APPLICATION OF A-CELL OF CHEST/ABDOMEN N/A 08/24/2019   Procedure: Application Of A-Cell Of Chest/Abdomen;  Surgeon: Kerin Perna, MD;  Location: [facility] OR;  Service: Thoracic;  Laterality: N/A;  . APPLICATION OF A-CELL OF CHEST/ABDOMEN N/A 09/05/2019   Procedure: Application Of A-Cell Of Chest/Abdomen;  Surgeon: Peggye Form, DO;  Location: [facility] OR;  Service: Plastics;  Laterality: N/A;  . APPLICATION OF A-CELL OF CHEST/ABDOMEN  09/03/2019   Procedure: Application Of A-Cell Of Chest/Abdomen;  Surgeon: Kerin Perna, MD;  Location: [facility] OR;  Service: Thoracic;;  . APPLICATION OF WOUND VAC N/A 08/22/2019   Procedure: Debridement, Irrigation and Packing of Abdominal Incision.;  Surgeon: Kerin Perna, MD;  Location: [facility] OR;  Service: Thoracic;  Laterality: N/A;  . APPLICATION OF WOUND VAC N/A 08/24/2019   Procedure: Irrigation and Debridement with WOUND VAC APPLICATION;  Surgeon: Kerin Perna, MD;  Location: [facility] OR;   Service: Thoracic;  Laterality: N/A;  . APPLICATION OF WOUND VAC N/A 08/30/2019   Procedure: APPLICATION OF WOUND VAC;  Surgeon: Kerin Perna, MD;  Location: [facility] OR;  Service: Thoracic;  Laterality: N/A;  . APPLICATION OF WOUND VAC N/A 09/03/2019   Procedure: WOUND VAC CHANGE;  Surgeon: Kerin Perna, MD;  Location: [facility] OR;  Service: Thoracic;  Laterality: N/A;  . BIOPSY  08/23/2018   Procedure: BIOPSY;  Surgeon: Lemar Lofty., MD;  Location: [facility] ENDOSCOPY;  Service: Gastroenterology;;  . COLONOSCOPY N/A 08/23/2018   Procedure: COLONOSCOPY;  Surgeon: Lemar Lofty., MD;  Location: [facility] ENDOSCOPY;  Service: Gastroenterology;  Laterality: N/A;  . ENTEROSCOPY N/A 08/23/2018   Procedure: ENTEROSCOPY;  Surgeon: Meridee Score Netty Starring., MD;  Location: [facility] ENDOSCOPY;  Service: Gastroenterology;  Laterality: N/A;  . HEMOSTASIS CLIP PLACEMENT  08/23/2018   Procedure: HEMOSTASIS CLIP PLACEMENT;  Surgeon: Lemar Lofty., MD;  Location: [facility] ENDOSCOPY;  Service: Gastroenterology;;  . IABP INSERTION N/A 09/05/2017   Procedure: IABP INSERTION;  Surgeon: Dolores Patty, MD;  Location: [facility] INVASIVE CV LAB;  Service: Cardiovascular;  Laterality: N/A;  . INSERTION OF IMPLANTABLE LEFT VENTRICULAR ASSIST DEVICE N/A 09/06/2017   Procedure: INSERTION OF IMPLANTABLE LEFT VENTRICULAR ASSIST DEVICE - HM3;  Surgeon: Kerin Perna, MD;  Location: [facility] OR;  Service: Open Heart Surgery;  Laterality: N/A;  HM3  . MUSCLE FLAP CLOSURE N/A 09/05/2019   Procedure: MUSCLE FLAP CLOSURE;  Surgeon: Peggye Form, DO;  Location: [facility] OR;  Service: Plastics;  Laterality: N/A;  . NASAL FRACTURE SURGERY  1987  . POLYPECTOMY  08/23/2018   Procedure: POLYPECTOMY;  Surgeon: Mansouraty, Netty Starring., MD;  Location: [facility] ENDOSCOPY;  Service: Gastroenterology;;  . RIGHT HEART CATH N/A 08/30/2017   Procedure: RIGHT HEART CATH;  Surgeon: Dolores Patty, MD;  Location: [facility] INVASIVE CV LAB;  Service: Cardiovascular;   Laterality: N/A;  . RIGHT/LEFT HEART CATH AND CORONARY ANGIOGRAPHY N/A 10/08/2016   Procedure: Right/Left Heart Cath and Coronary Angiography;  Surgeon: Orpah Cobb, MD;  Location: [facility] INVASIVE CV LAB;  Service: Cardiovascular;  Laterality: N/A;  . STERNAL WOUND DEBRIDEMENT N/A 08/20/2019   Procedure: DEBRIDEMENT OF LVAD DRIVELINE TUNNEL;  Surgeon: Kerin Perna, MD;  Location: [facility] OR;  Service: Thoracic;  Laterality: N/A;  . STERNAL WOUND DEBRIDEMENT N/A 09/05/2019   Procedure: DEBRIDEMENT AND CLOSURE OF ABDOMINAL WOUND;  Surgeon: Peggye Form, DO;  Location: [facility] OR;  Service: Plastics;  Laterality: N/A;  . SUBMUCOSAL TATTOO INJECTION  08/23/2018   Procedure: SUBMUCOSAL TATTOO INJECTION;  Surgeon: Lemar Lofty., MD;  Location: [facility] ENDOSCOPY;  Service: Gastroenterology;;  . TEE WITHOUT CARDIOVERSION N/A 09/06/2017   Procedure: TRANSESOPHAGEAL ECHOCARDIOGRAM (TEE);  Surgeon: Donata Clay, Theron Arista, MD;  Location: [facility] OR;  Service: Open Heart Surgery;  Laterality: N/A;  . WOUND DEBRIDEMENT N/A 08/30/2019   Procedure: Debridement Abdominal Wound;  Surgeon: Kerin Perna, MD;  Location: [facility] OR;  Service: Thoracic;  Laterality: N/A;     Current Outpatient Medications  Medication Sig Dispense Refill  . albuterol (PROAIR HFA) 108 (90 Base) MCG/ACT inhaler Inhale 2  puffs into the lungs every 6 (six) hours as needed for wheezing or shortness of breath. 18 g 3  . cephALEXin (KEFLEX) 500 MG capsule Take 1 capsule (500 mg total) by mouth 3 (three) times daily. 90 capsule 11  . docusate sodium (STOOL SOFTENER) 100 MG capsule Take 1 capsule (100 mg total) by mouth 2 (two) times daily. (Patient taking differently: Take 100 mg by mouth daily as needed for mild constipation. ) 60 capsule 11  . DULoxetine (CYMBALTA) 60 MG capsule Take 1 capsule (60 mg total) by mouth daily. 30 capsule 3  . gabapentin (NEURONTIN) 300 MG capsule TAKE ONE CAPSULE BY MOUTH THREE TIMES DAILY (Patient taking differently: Take  300 mg by mouth 3 (three) times daily. ) 90 capsule 6  . hydrOXYzine (ATARAX/VISTARIL) 25 MG tablet Take 2 tablets (50 mg total) by mouth at bedtime as needed for anxiety. 60 tablet 11  . pantoprazole (PROTONIX) 40 MG tablet Take 1 tablet (40 mg total) by mouth 2 (two) times daily. 30 tablet 6  . torsemide (DEMADEX) 20 MG tablet Take 1 tablet (20 mg total) by mouth daily. (Patient taking differently: Take 20 mg by mouth daily. Patient taking every other day) 90 tablet 3  . warfarin (COUMADIN) 5 MG tablet Take 5 mg (1 tab) every Monday, Wed, and Friday and 2.5 mg (0.5 tab) all other days or as directed by HF Clinic. (Patient taking differently: Take 5 mg by mouth See admin instructions. Take 5 mg (1 tab) every Monday, Wed, and Friday and 2.5 mg (0.5 tab) all other days or as directed by HF Clinic.) 180 tablet 3  . digoxin (LANOXIN) 0.125 MG tablet Take 1 tablet (0.125 mg total) by mouth daily. (Patient not taking: Reported on 01/14/2020) 30 tablet 3  . Multiple Vitamin (MULTIVITAMIN WITH MINERALS) TABS tablet Take 1 tablet by mouth daily. (Patient not taking: Reported on 03/12/2020) 30 tablet 6  . sildenafil (REVATIO) 20 MG tablet Take 1 tablet (20 mg total) by mouth 3 (three) times daily. (Patient not taking: Reported on 03/12/2020) 90 tablet 11  . spironolactone (ALDACTONE) 25 MG tablet Take 0.5 tablets (12.5 mg total) by mouth daily. (Patient not taking: Reported on 01/14/2020) 30 tablet 3  . traMADol (ULTRAM) 50 MG tablet Take 2 tablets (100 mg total) by mouth every 6 (six) hours as needed for moderate pain. (Patient not taking: Reported on 11/23/2019) 40 tablet 0  . zinc sulfate 220 (50 Zn) MG capsule Take 1 capsule (220 mg total) by mouth daily. (Patient not taking: Reported on 01/14/2020) 30 capsule 6   No current facility-administered medications for this encounter.    Allergies:   Patient has no known allergies.   Social History:  The patient  reports that he has quit smoking. His smoking use  included cigarettes. He has a 12.50 pack-year smoking history. He has never used smokeless tobacco. He reports previous alcohol use. He reports previous drug use. Drug: Marijuana.   Family History:  The patient's family history includes Alcohol abuse in his cousin; Hypertension in his father.   ROS:  Please see the history of present illness.   All other systems are personally reviewed and negative.     Vital Signs: Temp: 98 Doppler Pressure: 90 Automatc BP: 120/87 (106) HR: 98 SPO2: UTO % RA  Weight: 198  lbs w/ eqt Hospital d/c wt: 196.8 lbs w/o equip  General:  NAD.   General:  Well appearing NAD.  HEENT: normal  Neck: supple. JVP not  elevated.  Carotids 2+ bilat; no bruits. No lymphadenopathy or thryomegaly appreciated. Cor: LVAD hum.  Lungs: Clear. Abdomen: obese soft, nontender, non-distended. No hepatosplenomegaly. No bruits or masses. Good bowel sounds. Driveline site clean. Anchor in place.  Extremities: no cyanosis, clubbing, rash. Warm no edema  Neuro: alert & oriented x 3. No focal deficits. Moves all 4 without problem   Recent Labs: 04/23/2019: B Natriuretic Peptide 709.6 08/13/2019: TSH 0.209 01/05/2020: Magnesium 1.9 03/12/2020: ALT 13; BUN 9; Creatinine, Ser 0.98; Hemoglobin 12.3; Platelets 173; Potassium 3.7; Sodium 138    Wt Readings from Last 3 Encounters:  01/14/20 89.8 kg (198 lb)  01/06/20 87.8 kg (193 lb 9 oz)  11/23/19 99.2 kg (218 lb 12.8 oz)      ASSESSMENT AND PLAN:  1. 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 Treated with milrinone and then weaned off.  Diuresed 40 pounds. VAD speed increased 5600 -> 6300 - Now much improved. Stable NYHA II - Volume status seems to be going back up. Weight up 10 pounds.  - Increase torsemide back to 20 daily.   2. Recurrent DL infection with subxiphoid abscess - s/p debridement and pec flap in 4/21 -  remains on keflex - had recurrent superficial subxiphoid abscess. CT with posible fluid collection. Evaluated by TCTS And IR and found to be muscle flap with no draiable fluid collection 6/21  - Site looks good today. Continue Keflex - He is not a candidate for pump exchange given severe RV dysfunction and poor self-care  3. HM3 LVAD 09/06/2017.  - VAD interrogated personally. Parameters stable. - LDH 342 - INR 2.8 goal 2.0-2.5  Discussed dosing with PharmD personally. - Off ASA with PUD.  - hgb 12.3   4. Depression - Improved today - Following with psych. Starting Lexapro and hydroxyzine  5.  Essential HTN - MAPs look ok - He has been off spiro. Will restart  - Had AKI with Entresto in past  6. OSA - sleep study with very severe OSA (AHI 69/hr) - continues to refuse therapy. We have arranged f/u in sleep clinic several times but he has not been compliant - Hopefully will improve with weight loss.  - Consider retesting.  7. H/o GI bleed  - 08/2018 EGD completed and showed gastritis and nonbleeding duodenal ulcers, colonoscopy with 5 polyps removed. Protonix was increased to BID.  - No recent GI bleeding - hgb 10.6 - He will stay off aspirin for now.   8. Tobacco Abuse  - continues to smoke 2-3 cigs/day. - reinforced need to quit  9. Elevated bilirubin and low pre-albumin - suspected related to ETOH use and RV failure +/- fatty liver - Bili 1.6 today (improved)  Total time spent 40 minutes. Over half that time spent discussing above.   Signed, Arvilla Meres, MD  03/12/2020 1:55 PM  [facility] [address] [facility] [place] [address] [phone] (office) [phone] (fax)

## 2020-03-12 NOTE — Progress Notes (Signed)
LVAD INR 

## 2020-03-12 NOTE — Addendum Note (Signed)
Encounter addended by: Levonne Spiller, RN on: 03/12/2020 10:58 AM  Actions taken: Order Reconciliation Section accessed, Home Medications modified, Medication List reviewed, Medication taking status modified, Clinical Note Signed, Order list changed, Pend clinical note

## 2020-03-12 NOTE — Telephone Encounter (Signed)
Advanced Heart Failure Patient Advocate Encounter  Prior Authorization for Sildenafil has been approved.    PA# [id number]  Effective dates: 02/11/20 through 03/12/21  Patients co-pay is $1.30.  Called and updated the pharmacy and the patient.   Archer Asa, CPhT

## 2020-03-12 NOTE — Telephone Encounter (Signed)
Patient Advocate Encounter   Received notification from [employer] that prior authorization for Sildenafil is required.   PA submitted on CoverMyMeds Key [id number] Status is pending   Will continue to follow.

## 2020-03-12 NOTE — Patient Instructions (Signed)
1. Re-start Spiro 12.5 mg daily. 2. Increase Torsemide to 20 mg daily. 3. Re-start Sildenafil to 20 mg three times daily 4. Re-start Cymbalta 60 mg daily (you have that in your bag) 5. Return to VAD clinic in 1 month, bring meds from home.

## 2020-03-12 NOTE — Addendum Note (Signed)
Encounter addended by: Dolores Patty, MD on: 03/12/2020 2:14 PM  Actions taken: Clinical Note Signed

## 2020-03-12 NOTE — Progress Notes (Signed)
Patient presents for six week f/u in VAD clinic today by himself. Denies any issues with his VAD or alarms.   Pt ambulated to clinic visit today and reports he feels "good" physically, but has been "so emotional" over the past month. Reports he is crying "over everything". Pt tearful while VAD coordinator obtained V/S.   Patient brought home meds to clinic visit for review. He does not have Lexapro, but does have full bottle of Cymbalta 60 mg capsules. Says he took the Cymbalta 30 mg as directed and hasn't taken anything since then (@ one month). Dr. [name] instructed patient to re-start Cymbalta 60 mg daily. If mood does not improve over next two weeks, pt needs to inform VAD Coordinator at his weekly dressing change appointment. Pt verbalized agreement to same.  Pt has decreased his daily torsemide from 20 mg daily ot 20 mg every other day, because he "was using the bathroom too much". Weight up today and home wts have been trending up. Dr. [name] instructed pt to resume Torsemide 20 mg daily.   Patient reports he has been unable to obtain Sildenafil because pharmacy needs "doctor approval". Prior Berkley Harvey has been obtained by Zachery Dauer, Pharmacy tech. She called local pharmacy and was told prescription would be ready for patient.   Pt has not been taking Spiro 12.5 mg daily; Dr. [name] instructed him to re-start daily dose.  Pt also did not have Digoxin, MVI, or Zinc; Dr. [name] says it is ok not to take these if he doesn't have. Pt going to pharmacy to pick up medication refills. Asked him to bring meds back next visit so we can review again. He agreed to plan.   Pt reports he went to dentist and was told he needs 6 teeth extracted. He reports dentist says it is ok if his INR is between 2.0 - 3.0 and that he will pull teeth in his office. Dr. [name] agrees with plan.   Vital Signs: Temp: 98.1 Doppler Pressure: 100 Automatc BP: 127/96 (109) HR: UTO SPO2: 98% RA  Weight: 208.6   lbs w/ eqt Hospital d/c wt: 198 lbs w/o equip   Home wts: 198 - 202 lbs  VAD Indication: Destination Therapy   VAD interrogation & Equipment Management: Speed: 6500 Flow: 6.1 Power: 5.9w    PI: 2.3 Hct: 35  Alarms: multiple  low voltage advisories and No External Power; pt reports he had to sleep in spare room but now has gas turned back on and will return to his bedroom Events: rare  Primary Controller:  Replace back up battery in 28 months Back up controller:   Replace back up battery in 5 months - replaced with 32 mos  Back up controller [id number] V7.2 internal battery replaced with [id number]; exp 09/20/21  I reviewed the LVAD parameters from today and compared the results to the patient's prior recorded data.  LVAD interrogation was NEGATIVE for significant power changes, POSITIVE for clinical alarms and STABLE for PI events/speed drops. No programming changes were made and pump is functioning within specified parameters.   Exit Site Care: Drive line is being maintained weekly by VAD coordinators. Existing VAD dressing removed and site care performed using sterile technique. Drive line exit site cleaned with Chlora prep applicators x 2, rinsed with sterile saline, allowed to dry, and Sorbaview dressing with gauze dressing over site applied. Anchor reapplied and all covered with two large Tegaderm dressings. Drive line exit site incorporated, the velour is fully implanted at exit  site. No drainage, erythema, tenderness, or foul odor noted.  Pt denies fever or chills. Continue weekly dressing changes. VAD coordinators to perform weekly dressing changes.   Device:none  BP & Labs:  Doppler 100 - reflecting MAP  Hgb 12.3 - No S/S of bleeding. Specifically denies melena/BRBPR or nosebleeds.  LDH 342 - and is above established baseline of 200-300. Denies tea-colored urine. No power elevations noted on interrogation. Will repeat LDH in one week.   2.5 year Intermacs follow up  completed including:  Quality of Life, KCCQ-12, and Neurocognitive trail making.   [facility] Cardiomyopathy Questionnaire  KCCQ-12 03/12/2020  1 a. Ability to shower/bathe Slightly limited  1 b. Ability to walk 1 block Slightly limited  1 c. Ability to hurry/jog Moderately limited  2. Edema feet/ankles/legs Never over the past 2 weeks  3. Limited by fatigue 1-2 times a week  4. Limited by dyspnea 3+ times a week, not every day  5. Sitting up / on 3+ pillows Never over the past 2 weeks  6. Limited enjoyment of life Slightly limited  7. Rest of life w/ symptoms Somewhat satisfied  8 a. Participation in hobbies Moderately limited  8 b. Participation in chores Moderately limited  8 c. Visiting family/friends Moderately limited   Pt did not complete 6 minute walk today; no time. Will perform next week.   Back up controller: 11V backup battery replaced and charged during this visit.   Patient Instructions:  1. Re-start Spiro 12.5 mg daily. 2. Increase Torsemide to 20 mg daily. 3. Re-start Sildenafil to 20 mg three times daily 4. Re-start Cymbalta 60 mg daily (you have that in your bag) 5. Return to VAD clinic in 1 month, bring meds from home.   Hessie Diener RN VAD Coordinator  Office: [phone]  24/7 Pager: [phone]

## 2020-03-12 NOTE — Addendum Note (Signed)
Encounter addended by: Levonne Spiller, RN on: 03/12/2020 3:06 PM  Actions taken: Flowsheet accepted, Vitals modified, Pend clinical note, Delete clinical note, Medication long-term status modified, Order list changed, Order Reconciliation Section accessed, Clinical Note Signed

## 2020-03-14 ENCOUNTER — Other Ambulatory Visit (HOSPITAL_COMMUNITY): Payer: Self-pay | Admitting: Unknown Physician Specialty

## 2020-03-14 DIAGNOSIS — Z95811 Presence of heart assist device: Secondary | ICD-10-CM

## 2020-03-14 DIAGNOSIS — Z7901 Long term (current) use of anticoagulants: Secondary | ICD-10-CM

## 2020-03-15 ENCOUNTER — Inpatient Hospital Stay (HOSPITAL_COMMUNITY): Payer: Medicare Other

## 2020-03-15 ENCOUNTER — Encounter (HOSPITAL_COMMUNITY): Payer: Self-pay | Admitting: Emergency Medicine

## 2020-03-15 ENCOUNTER — Other Ambulatory Visit: Payer: Self-pay

## 2020-03-15 ENCOUNTER — Emergency Department (HOSPITAL_COMMUNITY): Payer: Medicare Other

## 2020-03-15 ENCOUNTER — Inpatient Hospital Stay (HOSPITAL_COMMUNITY)
Admission: EM | Admit: 2020-03-15 | Discharge: 2020-03-20 | DRG: 439 | Disposition: A | Discharge status: Home / Self Care | Payer: Medicare Other | Attending: Cardiology | Admitting: Cardiology

## 2020-03-15 DIAGNOSIS — F1021 Alcohol dependence, in remission: Secondary | ICD-10-CM | POA: Diagnosis not present

## 2020-03-15 DIAGNOSIS — Z20822 Contact with and (suspected) exposure to covid-19: Secondary | ICD-10-CM | POA: Diagnosis present

## 2020-03-15 DIAGNOSIS — Z8249 Family history of ischemic heart disease and other diseases of the circulatory system: Secondary | ICD-10-CM

## 2020-03-15 DIAGNOSIS — J45909 Unspecified asthma, uncomplicated: Secondary | ICD-10-CM | POA: Diagnosis present

## 2020-03-15 DIAGNOSIS — I110 Hypertensive heart disease with heart failure: Secondary | ICD-10-CM | POA: Diagnosis present

## 2020-03-15 DIAGNOSIS — Z8711 Personal history of peptic ulcer disease: Secondary | ICD-10-CM

## 2020-03-15 DIAGNOSIS — K219 Gastro-esophageal reflux disease without esophagitis: Secondary | ICD-10-CM | POA: Diagnosis present

## 2020-03-15 DIAGNOSIS — Z79899 Other long term (current) drug therapy: Secondary | ICD-10-CM

## 2020-03-15 DIAGNOSIS — Z720 Tobacco use: Secondary | ICD-10-CM | POA: Diagnosis not present

## 2020-03-15 DIAGNOSIS — I5022 Chronic systolic (congestive) heart failure: Secondary | ICD-10-CM | POA: Diagnosis present

## 2020-03-15 DIAGNOSIS — I5042 Chronic combined systolic (congestive) and diastolic (congestive) heart failure: Secondary | ICD-10-CM

## 2020-03-15 DIAGNOSIS — I5043 Acute on chronic combined systolic (congestive) and diastolic (congestive) heart failure: Secondary | ICD-10-CM

## 2020-03-15 DIAGNOSIS — F329 Major depressive disorder, single episode, unspecified: Secondary | ICD-10-CM | POA: Diagnosis present

## 2020-03-15 DIAGNOSIS — K859 Acute pancreatitis without necrosis or infection, unspecified: Secondary | ICD-10-CM | POA: Diagnosis present

## 2020-03-15 DIAGNOSIS — G4733 Obstructive sleep apnea (adult) (pediatric): Secondary | ICD-10-CM | POA: Diagnosis present

## 2020-03-15 DIAGNOSIS — Z95811 Presence of heart assist device: Secondary | ICD-10-CM

## 2020-03-15 DIAGNOSIS — I509 Heart failure, unspecified: Secondary | ICD-10-CM

## 2020-03-15 DIAGNOSIS — I5082 Biventricular heart failure: Secondary | ICD-10-CM | POA: Diagnosis not present

## 2020-03-15 DIAGNOSIS — K852 Alcohol induced acute pancreatitis without necrosis or infection: Secondary | ICD-10-CM | POA: Diagnosis present

## 2020-03-15 DIAGNOSIS — F17210 Nicotine dependence, cigarettes, uncomplicated: Secondary | ICD-10-CM | POA: Diagnosis present

## 2020-03-15 DIAGNOSIS — K8500 Idiopathic acute pancreatitis without necrosis or infection: Secondary | ICD-10-CM | POA: Diagnosis not present

## 2020-03-15 DIAGNOSIS — Z7901 Long term (current) use of anticoagulants: Secondary | ICD-10-CM | POA: Diagnosis not present

## 2020-03-15 DIAGNOSIS — F419 Anxiety disorder, unspecified: Secondary | ICD-10-CM

## 2020-03-15 DIAGNOSIS — F3289 Other specified depressive episodes: Secondary | ICD-10-CM

## 2020-03-15 LAB — COMPREHENSIVE METABOLIC PANEL
ALT: 12 U/L (ref 0–44)
ALT: 13 U/L (ref 0–44)
AST: 20 U/L (ref 15–41)
AST: 20 U/L (ref 15–41)
Albumin: 3.9 g/dL (ref 3.5–5.0)
Albumin: 3.9 g/dL (ref 3.5–5.0)
Alkaline Phosphatase: 138 U/L — ABNORMAL HIGH (ref 38–126)
Alkaline Phosphatase: 134 U/L — ABNORMAL HIGH (ref 38–126)
Anion gap: 14 (ref 5–15)
Anion gap: 13 (ref 5–15)
BUN: 12 mg/dL (ref 6–20)
BUN: 12 mg/dL (ref 6–20)
CO2: 20 mmol/L — ABNORMAL LOW (ref 22–32)
CO2: 21 mmol/L — ABNORMAL LOW (ref 22–32)
Calcium: 9.1 mg/dL (ref 8.9–10.3)
Calcium: 8.9 mg/dL (ref 8.9–10.3)
Chloride: 98 mmol/L (ref 98–111)
Chloride: 98 mmol/L (ref 98–111)
Creatinine, Ser: 1.15 mg/dL (ref 0.61–1.24)
Creatinine, Ser: 1.07 mg/dL (ref 0.61–1.24)
GFR, Estimated: >60 mL/min (ref >60)
GFR, Estimated: >60 mL/min (ref >60)
Glucose, Bld: 126 mg/dL — ABNORMAL HIGH (ref 70–99)
Glucose, Bld: 91 mg/dL (ref 70–99)
Potassium: 3.2 mmol/L — ABNORMAL LOW (ref 3.5–5.1)
Potassium: 3.3 mmol/L — ABNORMAL LOW (ref 3.5–5.1)
Sodium: 132 mmol/L — ABNORMAL LOW (ref 135–145)
Sodium: 132 mmol/L — ABNORMAL LOW (ref 135–145)
Total Bilirubin: 2.3 mg/dL — ABNORMAL HIGH (ref 0.3–1.2)
Total Bilirubin: 1.7 mg/dL — ABNORMAL HIGH (ref 0.3–1.2)
Total Protein: 9.1 g/dL — ABNORMAL HIGH (ref 6.5–8.1)
Total Protein: 9 g/dL — ABNORMAL HIGH (ref 6.5–8.1)

## 2020-03-15 LAB — URINALYSIS, ROUTINE W REFLEX MICROSCOPIC
Bacteria, UA: NONE SEEN
Bilirubin Urine: NEGATIVE
Glucose, UA: NEGATIVE mg/dL
Hgb urine dipstick: NEGATIVE
Ketones, ur: NEGATIVE mg/dL
Leukocytes,Ua: NEGATIVE
Nitrite: NEGATIVE
Protein, ur: 100 mg/dL — AB
Specific Gravity, Urine: 1.04 — ABNORMAL HIGH (ref 1.005–1.030)
pH: 5 (ref 5.0–8.0)

## 2020-03-15 LAB — CBC
HCT: 47.1 % (ref 39.0–52.0)
HCT: 48.3 % (ref 39.0–52.0)
Hemoglobin: 15 g/dL (ref 13.0–17.0)
Hemoglobin: 15.1 g/dL (ref 13.0–17.0)
MCH: 26.5 pg (ref 26.0–34.0)
MCH: 26.7 pg (ref 26.0–34.0)
MCHC: 31.8 g/dL (ref 30.0–36.0)
MCHC: 31.3 g/dL (ref 30.0–36.0)
MCV: 83.4 fL (ref 80.0–100.0)
MCV: 85.5 fL (ref 80.0–100.0)
Platelets: 203 10*3/uL (ref 150–400)
Platelets: 207 10*3/uL (ref 150–400)
RBC: 5.65 MIL/uL (ref 4.22–5.81)
RBC: 5.65 MIL/uL (ref 4.22–5.81)
RDW: 19.5 % — ABNORMAL HIGH (ref 11.5–15.5)
RDW: 19.9 % — ABNORMAL HIGH (ref 11.5–15.5)
WBC: 21 10*3/uL — ABNORMAL HIGH (ref 4.0–10.5)
WBC: 20.8 10*3/uL — ABNORMAL HIGH (ref 4.0–10.5)
nRBC: 0 % (ref 0.0–0.2)
nRBC: 0 % (ref 0.0–0.2)

## 2020-03-15 LAB — LACTATE DEHYDROGENASE: LDH: 246 U/L — ABNORMAL HIGH (ref 98–192)

## 2020-03-15 LAB — MAGNESIUM: Magnesium: 1.8 mg/dL (ref 1.7–2.4)

## 2020-03-15 LAB — RESPIRATORY PANEL BY RT PCR (FLU A&B, COVID)
Influenza A by PCR: NEGATIVE
Influenza B by PCR: NEGATIVE
SARS Coronavirus 2 by RT PCR: NEGATIVE

## 2020-03-15 LAB — LIPASE, BLOOD: Lipase: 367 U/L — ABNORMAL HIGH (ref 11–51)

## 2020-03-15 LAB — PROTIME-INR
INR: 2.4 — ABNORMAL HIGH (ref 0.8–1.2)
INR: 2.2 — ABNORMAL HIGH (ref 0.8–1.2)
Prothrombin Time: 25.6 seconds — ABNORMAL HIGH (ref 11.4–15.2)
Prothrombin Time: 24 seconds — ABNORMAL HIGH (ref 11.4–15.2)

## 2020-03-15 LAB — PHOSPHORUS: Phosphorus: 3.3 mg/dL (ref 2.5–4.6)

## 2020-03-15 MED ORDER — DOCUSATE SODIUM 100 MG PO CAPS
100.0000 mg | ORAL_CAPSULE | Freq: Two times a day (BID) | ORAL | Status: DC
  Administered 2020-03-16: 100 mg via ORAL
  Filled 2020-03-15 (×9): qty 1

## 2020-03-15 MED ORDER — PANTOPRAZOLE SODIUM 40 MG PO TBEC
40.0000 mg | DELAYED_RELEASE_TABLET | Freq: Two times a day (BID) | ORAL | Status: DC
  Administered 2020-03-15 – 2020-03-20 (×11): 40 mg via ORAL
  Filled 2020-03-15 (×11): qty 1

## 2020-03-15 MED ORDER — GABAPENTIN 300 MG PO CAPS
300.0000 mg | ORAL_CAPSULE | Freq: Three times a day (TID) | ORAL | Status: DC
  Administered 2020-03-15 – 2020-03-20 (×15): 300 mg via ORAL
  Filled 2020-03-15 (×15): qty 1

## 2020-03-15 MED ORDER — IOHEXOL 300 MG/ML  SOLN
100.0000 mL | Freq: Once | INTRAMUSCULAR | Status: AC | PRN
  Administered 2020-03-15: 100 mL via INTRAVENOUS

## 2020-03-15 MED ORDER — SODIUM CHLORIDE 0.9 % IV SOLN: INTRAVENOUS | Status: AC

## 2020-03-15 MED ORDER — LORAZEPAM 2 MG/ML IJ SOLN: 1.0000 mg | INTRAMUSCULAR | Status: AC | PRN

## 2020-03-15 MED ORDER — FENTANYL CITRATE (PF) 100 MCG/2ML IJ SOLN
25.0000 ug | INTRAMUSCULAR | Status: DC | PRN
  Administered 2020-03-15 – 2020-03-16 (×3): 25 ug via INTRAVENOUS
  Filled 2020-03-15 (×3): qty 2

## 2020-03-15 MED ORDER — FOLIC ACID 1 MG PO TABS: 1.0000 mg | ORAL_TABLET | Freq: Every day | ORAL | Status: DC

## 2020-03-15 MED ORDER — DULOXETINE HCL 60 MG PO CPEP
60.0000 mg | ORAL_CAPSULE | Freq: Every day | ORAL | Status: DC
  Administered 2020-03-16 – 2020-03-20 (×5): 60 mg via ORAL
  Filled 2020-03-15 (×5): qty 1

## 2020-03-15 MED ORDER — MORPHINE SULFATE (PF) 4 MG/ML IV SOLN
4.0000 mg | Freq: Once | INTRAVENOUS | Status: AC
  Administered 2020-03-15: 4 mg via INTRAVENOUS
  Filled 2020-03-15: qty 1

## 2020-03-15 MED ORDER — ALBUTEROL SULFATE HFA 108 (90 BASE) MCG/ACT IN AERS
2.0000 | INHALATION_SPRAY | Freq: Four times a day (QID) | RESPIRATORY_TRACT | Status: DC | PRN
  Filled 2020-03-15: qty 6.7

## 2020-03-15 MED ORDER — LORAZEPAM 1 MG PO TABS: 1.0000 mg | ORAL_TABLET | ORAL | Status: AC | PRN

## 2020-03-15 MED ORDER — ACETAMINOPHEN 325 MG PO TABS
650.0000 mg | ORAL_TABLET | ORAL | Status: DC | PRN
  Administered 2020-03-19: 650 mg via ORAL
  Filled 2020-03-15: qty 2

## 2020-03-15 MED ORDER — ONDANSETRON HCL 4 MG/2ML IJ SOLN: 4.0000 mg | Freq: Four times a day (QID) | INTRAMUSCULAR | Status: DC | PRN

## 2020-03-15 MED ORDER — HYDROMORPHONE HCL 1 MG/ML IJ SOLN
0.5000 mg | Freq: Four times a day (QID) | INTRAMUSCULAR | Status: DC
  Administered 2020-03-15 – 2020-03-16 (×3): 0.5 mg via INTRAVENOUS
  Filled 2020-03-15 (×2): qty 1
  Filled 2020-03-15: qty 0.5

## 2020-03-15 MED ORDER — LACTATED RINGERS IV SOLN: INTRAVENOUS | Status: AC

## 2020-03-15 MED ORDER — THIAMINE HCL 100 MG PO TABS: 100.0000 mg | ORAL_TABLET | Freq: Every day | ORAL | Status: DC

## 2020-03-15 MED ORDER — SILDENAFIL CITRATE 20 MG PO TABS
20.0000 mg | ORAL_TABLET | Freq: Three times a day (TID) | ORAL | Status: DC
  Administered 2020-03-15 – 2020-03-20 (×15): 20 mg via ORAL
  Filled 2020-03-15 (×15): qty 1

## 2020-03-15 MED ORDER — THIAMINE HCL 100 MG PO TABS
100.0000 mg | ORAL_TABLET | Freq: Every day | ORAL | Status: AC
  Administered 2020-03-15 – 2020-03-19 (×5): 100 mg via ORAL
  Filled 2020-03-15 (×5): qty 1

## 2020-03-15 MED ORDER — THIAMINE HCL 100 MG/ML IJ SOLN: 100.0000 mg | Freq: Every day | INTRAMUSCULAR | Status: DC

## 2020-03-15 MED ORDER — CEPHALEXIN 500 MG PO CAPS
500.0000 mg | ORAL_CAPSULE | Freq: Three times a day (TID) | ORAL | Status: DC
  Administered 2020-03-15 – 2020-03-20 (×15): 500 mg via ORAL
  Filled 2020-03-15 (×10): qty 1
  Filled 2020-03-15 (×2): qty 2
  Filled 2020-03-15 (×5): qty 1

## 2020-03-15 MED ORDER — ADULT MULTIVITAMIN W/MINERALS CH: 1.0000 | ORAL_TABLET | Freq: Every day | ORAL | Status: DC

## 2020-03-15 MED ORDER — HYDROXYZINE HCL 25 MG PO TABS
50.0000 mg | ORAL_TABLET | Freq: Every evening | ORAL | Status: DC | PRN
  Administered 2020-03-17 – 2020-03-19 (×3): 50 mg via ORAL
  Filled 2020-03-15 (×3): qty 2

## 2020-03-15 MED ORDER — HYDROMORPHONE HCL 1 MG/ML IJ SOLN
0.5000 mg | Freq: Once | INTRAMUSCULAR | Status: AC
  Administered 2020-03-15: 0.5 mg via INTRAVENOUS
  Filled 2020-03-15: qty 1

## 2020-03-15 MED ORDER — FOLIC ACID 1 MG PO TABS
1.0000 mg | ORAL_TABLET | Freq: Every day | ORAL | Status: AC
  Administered 2020-03-15 – 2020-03-19 (×5): 1 mg via ORAL
  Filled 2020-03-15 (×5): qty 1

## 2020-03-15 MED ORDER — ADULT MULTIVITAMIN W/MINERALS CH
1.0000 | ORAL_TABLET | Freq: Every day | ORAL | Status: DC
  Administered 2020-03-15 – 2020-03-20 (×6): 1 via ORAL
  Filled 2020-03-15 (×6): qty 1

## 2020-03-15 NOTE — ED Notes (Signed)
Dinner Tray Ordered @ 1706. 

## 2020-03-15 NOTE — Consult Note (Signed)
[facility] Consult Note  ASHLAND WISEMAN UXL:[id number] DOB: [birth year] DOA: 03/15/2020  PCP: Dolores Patty, MD  Patient coming from: home   I have personally briefly reviewed patient's old medical records in [facility] EMR.  Chief Concern: abdominal pain  HPI: Jonathan Wood is a 49 y.o. male with medical history significant for nonischemic cardiomyopathy with EF 10%, hypertension, EtOH abuse, current tobacco use, who underwent HM 3 LVAD placement on 09/06/2017, DL infection with overlying cellulitis, large subxiphoid abscess status post debridement and eventual a pectoral muscle flap, with wound cultures positive for MSSA treated with Ancef and p.o. rifabutin on chronic Keflex, she of cardiogenic shock, presented to the ED for chief concerns of diffuse abdominal pain with radiation to the back.  He describes the pain as dull and radiates to his back.  He endorses that on the evening of 03/13/2020 while watching football he drank approximately 1 pint of alcohol.  He endorses the acute abdominal pain started several hours after the football game. He reports that this will be the last time he uses alcohol.  He reports he has never had this pain before.  Review of system was negative for fever, cough, chills, dysuria, hematuria, diarrhea, constipation.  ED Course: Discussed with ED provider.  LVAD team is going to be the primary and they would like consultation on acute pancreatitis management.  Review of Systems: As per HPI otherwise 10 point review of systems negative.   Past Medical History:  Diagnosis Date  . Asthma   . CHF (congestive heart failure) (HCC)    a. 09/2016: EF 20-25% with cath showing normal cors  . GERD (gastroesophageal reflux disease)   . History of hiatal hernia   . LVAD (left ventricular assist device) present (HCC)   . OSA on CPAP 09/06/2018   Severe OSA with AHI 68/hr on CPAP at 12cm H2O   Past Surgical History:  Procedure Laterality Date    . APPLICATION OF A-CELL OF CHEST/ABDOMEN N/A 08/24/2019   Procedure: Application Of A-Cell Of Chest/Abdomen;  Surgeon: Kerin Perna, MD;  Location: [facility] OR;  Service: Thoracic;  Laterality: N/A;  . APPLICATION OF A-CELL OF CHEST/ABDOMEN N/A 09/05/2019   Procedure: Application Of A-Cell Of Chest/Abdomen;  Surgeon: Peggye Form, DO;  Location: [facility] OR;  Service: Plastics;  Laterality: N/A;  . APPLICATION OF A-CELL OF CHEST/ABDOMEN  09/03/2019   Procedure: Application Of A-Cell Of Chest/Abdomen;  Surgeon: Kerin Perna, MD;  Location: [facility] OR;  Service: Thoracic;;  . APPLICATION OF WOUND VAC N/A 08/22/2019   Procedure: Debridement, Irrigation and Packing of Abdominal Incision.;  Surgeon: Kerin Perna, MD;  Location: [facility] OR;  Service: Thoracic;  Laterality: N/A;  . APPLICATION OF WOUND VAC N/A 08/24/2019   Procedure: Irrigation and Debridement with WOUND VAC APPLICATION;  Surgeon: Kerin Perna, MD;  Location[facility] OR;  Service: Thoracic;  Laterality: N/A;  . APPLICATION OF WOUND VAC N/A 08/30/2019   Procedure: APPLICATION OF WOUND VAC;  Surgeon: Kerin Perna, MD;  Location: [facility] OR;  Service: Thoracic;  Laterality: N/A;  . APPLICATION OF WOUND VAC N/A 09/03/2019   Procedure: WOUND VAC CHANGE;  Surgeon: Kerin Perna, MD;  Location: [facility] OR;  Service: Thoracic;  Laterality: N/A;  . BIOPSY  08/23/2018   Procedure: BIOPSY;  Surgeon: Lemar Lofty., MD;  Location: [facility] ENDOSCOPY;  Service: Gastroenterology;;  . COLONOSCOPY N/A 08/23/2018   Procedure: COLONOSCOPY;  Surgeon: Lemar Lofty., MD;  Location: [facility] ENDOSCOPY;  Service: Gastroenterology;  Laterality: N/A;  . ENTEROSCOPY N/A 08/23/2018   Procedure: ENTEROSCOPY;  Surgeon: Meridee Score Netty Starring., MD;  Location: [facility] ENDOSCOPY;  Service: Gastroenterology;  Laterality: N/A;  . HEMOSTASIS CLIP PLACEMENT  08/23/2018   Procedure: HEMOSTASIS CLIP PLACEMENT;  Surgeon: Lemar Lofty., MD;  Location: [facility] ENDOSCOPY;  Service:  Gastroenterology;;  . IABP INSERTION N/A 09/05/2017   Procedure: IABP INSERTION;  Surgeon: Dolores Patty, MD;  Location: [facility] INVASIVE CV LAB;  Service: Cardiovascular;  Laterality: N/A;  . INSERTION OF IMPLANTABLE LEFT VENTRICULAR ASSIST DEVICE N/A 09/06/2017   Procedure: INSERTION OF IMPLANTABLE LEFT VENTRICULAR ASSIST DEVICE - HM3;  Surgeon: Kerin Perna, MD;  Location: [facility] OR;  Service: Open Heart Surgery;  Laterality: N/A;  HM3  . MUSCLE FLAP CLOSURE N/A 09/05/2019   Procedure: MUSCLE FLAP CLOSURE;  Surgeon: Peggye Form, DO;  Location: [facility] OR;  Service: Plastics;  Laterality: N/A;  . NASAL FRACTURE SURGERY  1987  . POLYPECTOMY  08/23/2018   Procedure: POLYPECTOMY;  Surgeon: Mansouraty, Netty Starring., MD;  Location: [facility] ENDOSCOPY;  Service: Gastroenterology;;  . RIGHT HEART CATH N/A 08/30/2017   Procedure: RIGHT HEART CATH;  Surgeon: Dolores Patty, MD;  Location: [facility] INVASIVE CV LAB;  Service: Cardiovascular;  Laterality: N/A;  . RIGHT/LEFT HEART CATH AND CORONARY ANGIOGRAPHY N/A 10/08/2016   Procedure: Right/Left Heart Cath and Coronary Angiography;  Surgeon: Orpah Cobb, MD;  Location: [facility] INVASIVE CV LAB;  Service: Cardiovascular;  Laterality: N/A;  . STERNAL WOUND DEBRIDEMENT N/A 08/20/2019   Procedure: DEBRIDEMENT OF LVAD DRIVELINE TUNNEL;  Surgeon: Kerin Perna, MD;  Location: [facility] OR;  Service: Thoracic;  Laterality: N/A;  . STERNAL WOUND DEBRIDEMENT N/A 09/05/2019   Procedure: DEBRIDEMENT AND CLOSURE OF ABDOMINAL WOUND;  Surgeon: Peggye Form, DO;  Location: [facility] OR;  Service: Plastics;  Laterality: N/A;  . SUBMUCOSAL TATTOO INJECTION  08/23/2018   Procedure: SUBMUCOSAL TATTOO INJECTION;  Surgeon: Lemar Lofty., MD;  Location: [facility] ENDOSCOPY;  Service: Gastroenterology;;  . TEE WITHOUT CARDIOVERSION N/A 09/06/2017   Procedure: TRANSESOPHAGEAL ECHOCARDIOGRAM (TEE);  Surgeon: Donata Clay, Theron Arista, MD;  Location: [facility] OR;  Service: Open Heart Surgery;  Laterality: N/A;    . WOUND DEBRIDEMENT N/A 08/30/2019   Procedure: Debridement Abdominal Wound;  Surgeon: Kerin Perna, MD;  Location: [facility] OR;  Service: Thoracic;  Laterality: N/A;   Social History:  reports that he has quit smoking. His smoking use included cigarettes. He has a 12.50 pack-year smoking history. He has never used smokeless tobacco. He reports previous alcohol use. He reports previous drug use. Drug: Marijuana.  No Known Allergies  Family History  Problem Relation Age of Onset  . Hypertension Father   . Alcohol abuse Cousin   . Colon cancer Neg Hx   . Esophageal cancer Neg Hx   . Inflammatory bowel disease Neg Hx   . Liver disease Neg Hx   . Pancreatic cancer Neg Hx   . Rectal cancer Neg Hx   . Stomach cancer Neg Hx   . Diabetes Neg Hx    Family history: Family history reviewed and positive for alcohol abuse and cousin and hypertension in the father  Prior to Admission medications   Medication Sig Start Date End Date Taking? Authorizing Provider  albuterol (PROAIR HFA) 108 (90 Base) MCG/ACT inhaler Inhale 2 puffs into the lungs every 6 (six) hours as needed for wheezing or shortness of breath. 11/13/19  Yes Bensimhon, Bevelyn Buckles, MD  amoxicillin (AMOXIL) 500 MG capsule  Take 2,000 mg by mouth See admin instructions. Take 4 capsules (2000 mg) by mouth one hour before dental appointments 03/04/20  Yes [provider]  cephALEXin (KEFLEX) 500 MG capsule Take 1 capsule (500 mg total) by mouth 3 (three) times daily. 10/15/19   Cliffton Asters, MD  docusate sodium (STOOL SOFTENER) 100 MG capsule Take 1 capsule (100 mg total) by mouth 2 (two) times daily. Patient taking differently: Take 100 mg by mouth daily as needed for mild constipation.  12/06/19   Bensimhon, Bevelyn Buckles, MD  DULoxetine (CYMBALTA) 60 MG capsule Take 1 capsule (60 mg total) by mouth daily. 01/06/20   Bhagat, Sharrell Ku, PA  gabapentin (NEURONTIN) 300 MG capsule TAKE ONE CAPSULE BY MOUTH THREE TIMES DAILY Patient taking  differently: Take 300 mg by mouth 3 (three) times daily.  06/07/19   Bensimhon, Bevelyn Buckles, MD  hydrOXYzine (ATARAX/VISTARIL) 25 MG tablet Take 2 tablets (50 mg total) by mouth at bedtime as needed for anxiety. 01/14/20   Bensimhon, Bevelyn Buckles, MD  pantoprazole (PROTONIX) 40 MG tablet Take 1 tablet (40 mg total) by mouth 2 (two) times daily. 01/06/20   Bhagat, Sharrell Ku, PA  sildenafil (REVATIO) 20 MG tablet Take 1 tablet (20 mg total) by mouth 3 (three) times daily. 02/27/20   Bensimhon, Bevelyn Buckles, MD  spironolactone (ALDACTONE) 25 MG tablet Take 0.5 tablets (12.5 mg total) by mouth daily. 01/06/20   Manson Passey, PA  torsemide (DEMADEX) 20 MG tablet Take 1 tablet (20 mg total) by mouth daily. Patient taking differently: Take 20 mg by mouth daily. Patient taking every other day 02/05/20   Bensimhon, Bevelyn Buckles, MD  warfarin (COUMADIN) 5 MG tablet Take 5 mg (1 tab) every Monday, Wed, and Friday and 2.5 mg (0.5 tab) all other days or as directed by HF Clinic. Patient taking differently: Take 5 mg by mouth See admin instructions. Take 5 mg (1 tab) every Monday, Wed, and Friday and 2.5 mg (0.5 tab) all other days or as directed by HF Clinic. 11/30/19   Evon Slack, RPH-CPP   Physical Exam: Vitals:   03/15/20 1615 03/15/20 1717 03/15/20 1745 03/15/20 1800  BP: (!) 156/118 (!) 122/101 125/80 (!) 123/99  Pulse: (!) 58 (!) 141 (!) 113 (!) 112  Resp: 16 (!) 27 (!) 32 (!) 29  Temp:      TempSrc:      SpO2: 93% 94% 92% 91%   Constitutional: NAD, calm, comfortable Eyes: PERRL, lids and conjunctivae normal ENMT: Mucous membranes are moist. Posterior pharynx clear of any exudate or lesions.Normal dentition.  Neck: normal, supple, no masses, no thyromegaly Respiratory: clear to auscultation bilaterally, no wheezing, no crackles. Normal respiratory effort. No accessory muscle use.  Cardiovascular: mechanical heart sounds with the hum of LVAD. No extremity edema. 2+ pedal pulses. No carotid bruits.  LVAD in  place Abdomen: no tenderness, no masses palpated. No hepatosplenomegaly. Bowel sounds positive.  Musculoskeletal: no clubbing / cyanosis. No joint deformity upper and lower extremities. Good ROM, no contractures. Normal muscle tone.  Skin: no rashes, lesions, ulcers. No induration Neurologic: CN 2-12 grossly intact. Sensation intact, DTR normal. Strength 5/5 in all 4.  Psychiatric: Normal judgment and insight. Alert and oriented x 3. Normal mood.   Labs on Admission: I have personally reviewed following labs and imaging studies  CBC: Recent Labs  Lab 03/12/20 1005 03/15/20 1128 03/15/20 1538  WBC 6.2 21.0* 20.8*  HGB 12.3* 15.0 15.1  HCT 40.2 47.1 48.3  MCV 86.5 83.4 85.5  PLT 173 203 207   Basic Metabolic Panel: Recent Labs  Lab 03/12/20 1005 03/15/20 1128 03/15/20 1538  NA 138 132* 132*  K 3.7 3.2* 3.3*  CL 105 98 98  CO2 20* 20* 21*  GLUCOSE 120* 126* 91  BUN 9 12 12   CREATININE 0.98 1.15 1.07  CALCIUM 8.6* 9.1 8.9  MG  --   --  1.8  PHOS  --   --  3.3   GFR: Estimated Creatinine Clearance: 99.2 mL/min (by C-G formula based on SCr of 1.07 mg/dL). Liver Function Tests: Recent Labs  Lab 03/12/20 1005 03/15/20 1128 03/15/20 1538  AST 29 20 20   ALT 13 12 13   ALKPHOS 118 138* 134*  BILITOT 0.9 2.3* 1.7*  PROT 7.3 9.1* 9.0*  ALBUMIN 3.3* 3.9 3.9   Recent Labs  Lab 03/15/20 1128  LIPASE 367*   No results for input(s): AMMONIA in the last 168 hours. Coagulation Profile: Recent Labs  Lab 03/12/20 1005 03/15/20 1302 03/15/20 1538  INR 2.8* 2.4* 2.2*   Urine analysis:    Component Value Date/Time   COLORURINE AMBER (A) 03/15/2020 1219   APPEARANCEUR HAZY (A) 03/15/2020 1219   LABSPEC 1.040 (H) 03/15/2020 1219   PHURINE 5.0 03/15/2020 1219   GLUCOSEU NEGATIVE 03/15/2020 1219   HGBUR NEGATIVE 03/15/2020 1219   BILIRUBINUR NEGATIVE 03/15/2020 1219   KETONESUR NEGATIVE 03/15/2020 1219   PROTEINUR 100 (A) 03/15/2020 1219   NITRITE NEGATIVE 03/15/2020  1219   LEUKOCYTESUR NEGATIVE 03/15/2020 1219   Radiological Exams on Admission: Personally reviewed and I agree with radiologist reading as below.  CT Abdomen Pelvis W Contrast  Result Date: 03/15/2020 CLINICAL DATA:  Left lower quadrant abdominal pain for 1 day. EXAM: CT ABDOMEN AND PELVIS WITH CONTRAST TECHNIQUE: Multidetector CT imaging of the abdomen and pelvis was performed using the standard protocol following bolus administration of intravenous contrast. CONTRAST:  03/17/2020 OMNIPAQUE IOHEXOL 300 MG/ML  SOLN COMPARISON:  CT abdomen pelvis dated 12/27/2019 and 11/14/2019. FINDINGS: Lower chest: There is mild bibasilar atelectasis. A left ventricular assist device is partially imaged and results in streak artifact which obscures the upper abdomen/lower thorax. Hepatobiliary: No focal liver abnormality is seen. No gallstones, gallbladder wall thickening, or biliary dilatation. Pancreas: The pancreas is edematous with surrounding edema and a small amount of fluid in the left retro colic gutter. There is fatty replacement of the pancreas, however the visible parenchyma appears to be normally enhancing and there is no definite evidence of pancreatic necrosis. There is no acute peripancreatic fluid collection. The pancreatic duct does not appear dilated. Spleen: Normal in size without focal abnormality. Adrenals/Urinary Tract: Adrenal glands are unremarkable. Other than left renal cysts which measure up to 2.2 cm, the kidneys are normal, without renal calculi, focal lesion, or hydronephrosis. Bladder is unremarkable. Stomach/Bowel: Stomach is within normal limits. No pericecal inflammatory changes are noted to suggest acute appendicitis. There is mild bowel wall thickening of the proximal portion of the descending colon which is likely reactive given the adjacent inflammatory changes from the patient's pancreatitis. No evidence of bowel obstruction. Vascular/Lymphatic: No significant vascular findings are  present. No portal vein thrombosis or splenic artery aneurysm is identified. No enlarged abdominal or pelvic lymph nodes. Reproductive: Prostate is unremarkable. Other: No abdominal wall hernia or abnormality. Musculoskeletal: No acute or significant osseous findings. IMPRESSION: 1. Acute interstitial edematous pancreatitis. No evidence of pancreatic necrosis or acute peripancreatic fluid collection. Electronically Signed   By: M.D.   On: 03/15/2020  13:09   DG Chest Port 1 View  Result Date: 03/15/2020 CLINICAL DATA:  LVAD.  Generalized abdominal pain. EXAM: PORTABLE CHEST 1 VIEW COMPARISON:  12/27/2019 FINDINGS: LVAD in place, unchanged. Prior median sternotomy. Cardiomegaly. No confluent opacities or effusions. Lingular scarring. IMPRESSION: Cardiomegaly.  No active disease. Electronically Signed   By: Charlett Nose M.D.   On: 03/15/2020 17:05   EKG: Independently reviewed, showing   Assessment/Plan  Principal Problem:   Acute pancreatitis Active Problems:   LVAD (left ventricular assist device) present (HCC)   Alcohol use disorder, moderate, in early remission (HCC)   Acute pancreatitis secondary to alcohol use -Fentanyl as needed -Given EF of 10%, we recommend against aggressive fluid management -LR IVF at 40 cc/h, for 1 day -Strict I's and O's -We will place patient on liquid to soft diet and patient can advance as tolerated  EtOH abuse -Patient endorses previous tremors when he does not have alcohol -Denies history of seizure from alcohol withdrawal -We will start patient on CIWA protocol with as needed Ativan  LVAD - per primary  Thank you for the consult, [facility] will continue to follow  DVT prophylaxis: Per primary Diet: Clear liquids to full diet advance as tolerated per patient Family Communication: Per primary Disposition Plan: Per primary Admission status: Primary team has made patient to progressive inpatient  Lakyn Mantione N Tanyon Alipio D.O. [facility]  If 7AM-7PM, please contact day-coverage provider [email or web address]  03/15/2020, 6:48 PM

## 2020-03-15 NOTE — Progress Notes (Signed)
ANTICOAGULATION CONSULT NOTE - Initial Consult  Pharmacy Consult for warfarin Indication: LVAD  No Known Allergies  Patient Measurements:    Vital Signs: Temp: 97.8 F (36.6 C) (10/30 1109) Temp Source: Oral (10/30 1109) Pulse Rate: 109 (10/30 1109)  Labs: Recent Labs    03/15/20 1128 03/15/20 1302  HGB 15.0  --   HCT 47.1  --   PLT 203  --   LABPROT  --  25.6*  INR  --  2.4*  CREATININE 1.15  --     Estimated Creatinine Clearance: 92.3 mL/min (by C-G formula based on SCr of 1.15 mg/dL).   Medical History: Past Medical History:  Diagnosis Date  . Asthma   . CHF (congestive heart failure) (HCC)    a. 09/2016: EF 20-25% with cath showing normal cors  . GERD (gastroesophageal reflux disease)   . History of hiatal hernia   . LVAD (left ventricular assist device) present (HCC)   . OSA on CPAP 09/06/2018   Severe OSA with AHI 68/hr on CPAP at 12cm H2O    Assessment: 49 YOM presenting with abdominal pain, LVAD pt on warfarin PTA with last dose taken today 10/30, INR 2.4 on admission.  CBC wnl.    PTA dosing: 7.5mg  MWF, 5mg  every other day  Goal of Therapy:  INR 2.0-2.5 per HF team Monitor platelets by anticoagulation protocol: Yes   Plan:  Has taken warfarin today, therapeutic, will add labs and f/u in AM Daily INR, s/s bleeding  , PharmD Clinical Pharmacist ED Pharmacist Phone # [phone] 03/15/2020 3:25 PM

## 2020-03-15 NOTE — H&P (Signed)
VAD TEAM History & Physical Note   Reason for Admission: Acute pancreatitis   HPI:    Jonathan Wood is a 49 y.o. 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  He is admitted today for treatment of acute ETOH-related pancreatitis.   Admitted 08/21/2018 with volume overload and anemia. Diuresed with IV lasix and transitioned to torsemide 20 mg daily. GI consulted EGD completed and showed gastritis and nonbleeding duodenal ulcers, colonoscopy with 5 polyps removed. Protonix increased to BID, he was started on PO iron,and hydrocortisone suppositories for hemorrhoid treatment. He received a total of 2 uPRBCs. He will stay off ASA forseveral weeksbut can reconsider later.  Discharge weight was 221 pounds.   Admitted 12/7-15/20 for DL infection with overlying cellulitis and volume overload. Treated with IV abx and diuresis.   Recently admitted 08/16/19-09/13/19 for DL infection with large subxiphoid abscess. Underwent several debridements and eventually a pec muscle flap. Wound cx + MSSA. Initially started on ancef and rifabutin but expanded to cefipime for a week then changed to IV ancef and po rifabutin. Now on oral keflex and rifabutin  Readmitted 11/13/19-11/16/19 with superficial abscess and tachypnea. Superficial abscess I&D'd and packed. CT scan done and showed possible deeper fluid collection. Reviewed with TCTS who felt it was muscle flap. Sent to IR for possible drainage but u/s confirmed it was the muscle flap and no drainable fluid collection. Wound cx negative. ID saw and recommended continuing Keflex.   Admitted 8/12-22/21 with lactic acidosis due to cardiogenic shock. Started on 0.25 of milrinone and NE.Ramp echo on 8/13 with severe RV dysfunction. Speed increased 5600->6300.Tolerated ok.Repeat ramp echo on 8/20 speed increased to 6500. Diuresed over 40 pounds. He was weaned off  milrinone.   Patient reports drinking about 2 pints/liquor per week.  On 10/28, he drank 1 pint of liquor. On 10/29, he developed peri-umbilical abdominal discomfort.  This gradually worsened.  No vomiting.  No significant exertional edema, no weight gain. LVAD parameters stable.  He came to the ER, CT chest showed non-necrotizing acute pancreatitis, thought to be due to ETOH.  Patient does report ETOH withdrawal symptoms in the past. Lipase elevated 367.   LVAD INTERROGATION:  HeartMate 3 LVAD:  Flow 6.6 liters/min, speed 6500, power 6, PI 3.  No PI events. .     Review of Systems: All systems reviewed and negative except as per HPI.   Home Medications Prior to Admission medications   Medication Sig Start Date End Date Taking? Authorizing Provider  albuterol (PROAIR HFA) 108 (90 Base) MCG/ACT inhaler Inhale 2 puffs into the lungs every 6 (six) hours as needed for wheezing or shortness of breath. 11/13/19  Yes Bensimhon, Bevelyn Buckles, MD  amoxicillin (AMOXIL) 500 MG capsule Take 2,000 mg by mouth See admin instructions. Take 4 capsules (2000 mg) by mouth one hour before dental appointments 03/04/20   [provider]  cephALEXin (KEFLEX) 500 MG capsule Take 1 capsule (500 mg total) by mouth 3 (three) times daily. 10/15/19   Cliffton Asters, MD  docusate sodium (STOOL SOFTENER) 100 MG capsule Take 1 capsule (100 mg total) by mouth 2 (two) times daily. Patient taking differently: Take 100 mg by mouth daily as needed for mild constipation.  12/06/19   Bensimhon, Bevelyn Buckles, MD  DULoxetine (CYMBALTA) 60 MG capsule Take 1 capsule (60 mg total) by mouth daily.  01/06/20   Bhagat, Sharrell Ku, PA  gabapentin (NEURONTIN) 300 MG capsule TAKE ONE CAPSULE BY MOUTH THREE TIMES DAILY Patient taking differently: Take 300 mg by mouth 3 (three) times daily.  06/07/19   Bensimhon, Bevelyn Buckles, MD  hydrOXYzine (ATARAX/VISTARIL) 25 MG tablet Take 2 tablets (50 mg total) by mouth at bedtime as needed for anxiety. 01/14/20    Bensimhon, Bevelyn Buckles, MD  pantoprazole (PROTONIX) 40 MG tablet Take 1 tablet (40 mg total) by mouth 2 (two) times daily. 01/06/20   Bhagat, Sharrell Ku, PA  sildenafil (REVATIO) 20 MG tablet Take 1 tablet (20 mg total) by mouth 3 (three) times daily. 02/27/20   Bensimhon, Bevelyn Buckles, MD  spironolactone (ALDACTONE) 25 MG tablet Take 0.5 tablets (12.5 mg total) by mouth daily. 01/06/20   Manson Passey, PA  torsemide (DEMADEX) 20 MG tablet Take 1 tablet (20 mg total) by mouth daily. Patient taking differently: Take 20 mg by mouth daily. Patient taking every other day 02/05/20   Bensimhon, Bevelyn Buckles, MD  warfarin (COUMADIN) 5 MG tablet Take 5 mg (1 tab) every Monday, Wed, and Friday and 2.5 mg (0.5 tab) all other days or as directed by HF Clinic. Patient taking differently: Take 5 mg by mouth See admin instructions. Take 5 mg (1 tab) every Monday, Wed, and Friday and 2.5 mg (0.5 tab) all other days or as directed by HF Clinic. 11/30/19   Evon Slack, RPH-CPP    Past Medical History: Past Medical History:  Diagnosis Date  . Asthma   . CHF (congestive heart failure) (HCC)    a. 09/2016: EF 20-25% with cath showing normal cors  . GERD (gastroesophageal reflux disease)   . History of hiatal hernia   . LVAD (left ventricular assist device) present (HCC)   . OSA on CPAP 09/06/2018   Severe OSA with AHI 68/hr on CPAP at 12cm H2O    Past Surgical History: Past Surgical History:  Procedure Laterality Date  . APPLICATION OF A-CELL OF CHEST/ABDOMEN N/A 08/24/2019   Procedure: Application Of A-Cell Of Chest/Abdomen;  Surgeon: Kerin Perna, MD;  Location: [facility] OR;  Service: Thoracic;  Laterality: N/A;  . APPLICATION OF A-CELL OF CHEST/ABDOMEN N/A 09/05/2019   Procedure: Application Of A-Cell Of Chest/Abdomen;  Surgeon: Peggye Form, DO;  Location: [facility] OR;  Service: Plastics;  Laterality: N/A;  . APPLICATION OF A-CELL OF CHEST/ABDOMEN  09/03/2019   Procedure: Application Of A-Cell Of Chest/Abdomen;   Surgeon: Kerin Perna, MD;  Location: [facility] OR;  Service: Thoracic;;  . APPLICATION OF WOUND VAC N/A 08/22/2019   Procedure: Debridement, Irrigation and Packing of Abdominal Incision.;  Surgeon: Kerin Perna, MD;  Location: [facility] OR;  Service: Thoracic;  Laterality: N/A;  . APPLICATION OF WOUND VAC N/A 08/24/2019   Procedure: Irrigation and Debridement with WOUND VAC APPLICATION;  Surgeon: Kerin Perna, MD;  Location: [facility] OR;  Service: Thoracic;  Laterality: N/A;  . APPLICATION OF WOUND VAC N/A 08/30/2019   Procedure: APPLICATION OF WOUND VAC;  Surgeon: Kerin Perna, MD;  Location: [facility] OR;  Service: Thoracic;  Laterality: N/A;  . APPLICATION OF WOUND VAC N/A 09/03/2019   Procedure: WOUND VAC CHANGE;  Surgeon: Kerin Perna, MD;  Location: [facility] OR;  Service: Thoracic;  Laterality: N/A;  . BIOPSY  08/23/2018   Procedure: BIOPSY;  Surgeon: Lemar Lofty., MD;  Location: [facility] ENDOSCOPY;  Service: Gastroenterology;;  . COLONOSCOPY N/A 08/23/2018   Procedure: COLONOSCOPY;  Surgeon: Lemar Lofty., MD;  Location: [facility] ENDOSCOPY;  Service: Gastroenterology;  Laterality: N/A;  . ENTEROSCOPY N/A 08/23/2018   Procedure: ENTEROSCOPY;  Surgeon: Meridee Score Netty Starring., MD;  Location: [facility] ENDOSCOPY;  Service: Gastroenterology;  Laterality: N/A;  . HEMOSTASIS CLIP PLACEMENT  08/23/2018   Procedure: HEMOSTASIS CLIP PLACEMENT;  Surgeon: Lemar Lofty., MD;  Location: [facility] ENDOSCOPY;  Service: Gastroenterology;;  . IABP INSERTION N/A 09/05/2017   Procedure: IABP INSERTION;  Surgeon: Dolores Patty, MD;  Location: [facility] INVASIVE CV LAB;  Service: Cardiovascular;  Laterality: N/A;  . INSERTION OF IMPLANTABLE LEFT VENTRICULAR ASSIST DEVICE N/A 09/06/2017   Procedure: INSERTION OF IMPLANTABLE LEFT VENTRICULAR ASSIST DEVICE - HM3;  Surgeon: Kerin Perna, MD;  Location: [facility] OR;  Service: Open Heart Surgery;  Laterality: N/A;  HM3  . MUSCLE FLAP CLOSURE N/A 09/05/2019   Procedure: MUSCLE FLAP CLOSURE;   Surgeon: Peggye Form, DO;  Location: [facility] OR;  Service: Plastics;  Laterality: N/A;  . NASAL FRACTURE SURGERY  1987  . POLYPECTOMY  08/23/2018   Procedure: POLYPECTOMY;  Surgeon: Mansouraty, Netty Starring., MD;  Location: [facility] ENDOSCOPY;  Service: Gastroenterology;;  . RIGHT HEART CATH N/A 08/30/2017   Procedure: RIGHT HEART CATH;  Surgeon: Dolores Patty, MD;  Location: [facility] INVASIVE CV LAB;  Service: Cardiovascular;  Laterality: N/A;  . RIGHT/LEFT HEART CATH AND CORONARY ANGIOGRAPHY N/A 10/08/2016   Procedure: Right/Left Heart Cath and Coronary Angiography;  Surgeon: Orpah Cobb, MD;  Location: [facility] INVASIVE CV LAB;  Service: Cardiovascular;  Laterality: N/A;  . STERNAL WOUND DEBRIDEMENT N/A 08/20/2019   Procedure: DEBRIDEMENT OF LVAD DRIVELINE TUNNEL;  Surgeon: Kerin Perna, MD;  Location: [facility] OR;  Service: Thoracic;  Laterality: N/A;  . STERNAL WOUND DEBRIDEMENT N/A 09/05/2019   Procedure: DEBRIDEMENT AND CLOSURE OF ABDOMINAL WOUND;  Surgeon: Peggye Form, DO;  Location: [facility] OR;  Service: Plastics;  Laterality: N/A;  . SUBMUCOSAL TATTOO INJECTION  08/23/2018   Procedure: SUBMUCOSAL TATTOO INJECTION;  Surgeon: Lemar Lofty., MD;  Location: [facility] ENDOSCOPY;  Service: Gastroenterology;;  . TEE WITHOUT CARDIOVERSION N/A 09/06/2017   Procedure: TRANSESOPHAGEAL ECHOCARDIOGRAM (TEE);  Surgeon: Donata Clay, Theron Arista, MD;  Location: [facility] OR;  Service: Open Heart Surgery;  Laterality: N/A;  . WOUND DEBRIDEMENT N/A 08/30/2019   Procedure: Debridement Abdominal Wound;  Surgeon: Kerin Perna, MD;  Location: [facility] OR;  Service: Thoracic;  Laterality: N/A;    Family History: Family History  Problem Relation Age of Onset  . Hypertension Father   . Alcohol abuse Cousin   . Colon cancer Neg Hx   . Esophageal cancer Neg Hx   . Inflammatory bowel disease Neg Hx   . Liver disease Neg Hx   . Pancreatic cancer Neg Hx   . Rectal cancer Neg Hx   . Stomach cancer Neg Hx   . Diabetes Neg Hx      Social History: Social History   Socioeconomic History  . Marital status: Legally Separated    Spouse name: Not on file  . Number of children: 3  . Years of education: Not on file  . Highest education level: Not on file  Occupational History  . Not on file  Tobacco Use  . Smoking status: Former Smoker    Packs/day: 0.50    Years: 25.00    Pack years: 12.50    Types: Cigarettes  . Smokeless tobacco: Never Used  . Tobacco comment: quit 08-16-2019  Vaping Use  . Vaping Use: Never used  Substance and Sexual Activity  .  Alcohol use: Not Currently    Comment: stopped drinking in April 2021  . Drug use: Not Currently    Types: Marijuana  . Sexual activity: Yes  Other Topics Concern  . Not on file  Social History Narrative   Lives at home with his three children (8, 78, and 49 years of age).    Just approved for SS disability.   Social Determinants of Health   Financial Resource Strain:   . Difficulty of Paying Living Expenses: Not on file  Food Insecurity:   . Worried About Programme researcher, broadcasting/film/video in the Last Year: Not on file  . Ran Out of Food in the Last Year: Not on file  Transportation Needs:   . Lack of Transportation (Medical): Not on file  . Lack of Transportation (Non-Medical): Not on file  Physical Activity:   . Days of Exercise per Week: Not on file  . Minutes of Exercise per Session: Not on file  Stress:   . Feeling of Stress : Not on file  Social Connections:   . Frequency of Communication with Friends and Family: Not on file  . Frequency of Social Gatherings with Friends and Family: Not on file  . Attends Religious Services: Not on file  . Active Member of Clubs or Organizations: Not on file  . Attends Banker Meetings: Not on file  . Marital Status: Not on file    Allergies:  No Known Allergies  Objective:    Vital Signs:   Temp:  [97.8 F (36.6 C)] 97.8 F (36.6 C) (10/30 1109) Pulse Rate:  [109] 109 (10/30 1109) Resp:  [20] 20  (10/30 1109) SpO2:  [98 %] 98 % (10/30 1109)   There were no vitals filed for this visit.  Mean arterial Pressure 100  Physical Exam: General:  Well appearing. No resp difficulty HEENT: normal Neck: supple. JVP not elevated. Carotids 2+ bilat; no bruits. No lymphadenopathy or thryomegaly appreciated. Cor: Mechanical heart sounds with LVAD hum present. Lungs: clear Abdomen: soft, nondistended. Mild peri-umbilical tenderness.  No hepatosplenomegaly. No bruits or masses. Good bowel sounds. Driveline: C/D/I; securement device intact and driveline incorporated Extremities: no cyanosis, clubbing, rash, edema Neuro: alert & orientedx3, cranial nerves grossly intact. moves all 4 extremities w/o difficulty. Affect pleasant  Telemetry: NSR 100s (personally reviewed)  Labs: Basic Metabolic Panel: Recent Labs  Lab 03/12/20 1005 03/15/20 1128  NA 138 132*  K 3.7 3.2*  CL 105 98  CO2 20* 20*  GLUCOSE 120* 126*  BUN 9 12  CREATININE 0.98 1.15  CALCIUM 8.6* 9.1    Liver Function Tests: Recent Labs  Lab 03/12/20 1005 03/15/20 1128  AST 29 20  ALT 13 12  ALKPHOS 118 138*  BILITOT 0.9 2.3*  PROT 7.3 9.1*  ALBUMIN 3.3* 3.9   Recent Labs  Lab 03/15/20 1128  LIPASE 367*   No results for input(s): AMMONIA in the last 168 hours.  CBC: Recent Labs  Lab 03/12/20 1005 03/15/20 1128  WBC 6.2 21.0*  HGB 12.3* 15.0  HCT 40.2 47.1  MCV 86.5 83.4  PLT 173 203    Cardiac Enzymes: No results for input(s): CKTOTAL, CKMB, CKMBINDEX, TROPONINI in the last 168 hours.  BNP: BNP (last 3 results) Recent Labs    03/21/19 1415 04/23/19 1350  BNP 426.9* 709.6*    ProBNP (last 3 results) No results for input(s): PROBNP in the last 8760 hours.   CBG: No results for input(s): GLUCAP in the last 168  hours.  Coagulation Studies: Recent Labs    03/15/20 1302  LABPROT 25.6*  INR 2.4*    Other results: Imaging: CT Abdomen Pelvis W Contrast  Result Date:  03/15/2020 CLINICAL DATA:  Left lower quadrant abdominal pain for 1 day. EXAM: CT ABDOMEN AND PELVIS WITH CONTRAST TECHNIQUE: Multidetector CT imaging of the abdomen and pelvis was performed using the standard protocol following bolus administration of intravenous contrast. CONTRAST:  OMNIPAQUE IOHEXOL 300 MG/ML  SOLN COMPARISON:  CT abdomen pelvis dated 12/27/2019 and 11/14/2019. FINDINGS: Lower chest: There is mild bibasilar atelectasis. A left ventricular assist device is partially imaged and results in streak artifact which obscures the upper abdomen/lower thorax. Hepatobiliary: No focal liver abnormality is seen. No gallstones, gallbladder wall thickening, or biliary dilatation. Pancreas: The pancreas is edematous with surrounding edema and a small amount of fluid in the left retro colic gutter. There is fatty replacement of the pancreas, however the visible parenchyma appears to be normally enhancing and there is no definite evidence of pancreatic necrosis. There is no acute peripancreatic fluid collection. The pancreatic duct does not appear dilated. Spleen: Normal in size without focal abnormality. Adrenals/Urinary Tract: Adrenal glands are unremarkable. Other than left renal cysts which measure up to 2.2 cm, the kidneys are normal, without renal calculi, focal lesion, or hydronephrosis. Bladder is unremarkable. Stomach/Bowel: Stomach is within normal limits. No pericecal inflammatory changes are noted to suggest acute appendicitis. There is mild bowel wall thickening of the proximal portion of the descending colon which is likely reactive given the adjacent inflammatory changes from the patient's pancreatitis. No evidence of bowel obstruction. Vascular/Lymphatic: No significant vascular findings are present. No portal vein thrombosis or splenic artery aneurysm is identified. No enlarged abdominal or pelvic lymph nodes. Reproductive: Prostate is unremarkable. Other: No abdominal wall hernia or  abnormality. Musculoskeletal: No acute or significant osseous findings. IMPRESSION: 1. Acute interstitial edematous pancreatitis. No evidence of pancreatic necrosis or acute peripancreatic fluid collection. Electronically Signed   By: Romona Curls M.D.   On: 03/15/2020 13:09         Plan/Discussion:    1. Acute pancreatitis: Suspect due to ETOH abuse.  CT abdomen suggestive of pancreatitis, no evidence for necrosis.  He admits to drinking.  No gallstones seen.  - Will make him NPO except for sips with meds.  - NS 100 cc/hr x 10 hrs then reassess.  - Pain control with prn Dilaudid.  2. ETOH abuse: He understands that he needs to quit.  Prior withdrawal symptoms.  - CIWA protocol.  - Thiamine/folate.  3. Chronic systolic HF with prominent RV failure. EF 10% s/p HM-3 LVAD on 09/06/17.  Admitted 8/21 for cardiogenic shock with severe RV failure. Treated with milrinone and then weaned off.  Diuresed 40 pounds. VAD speed increased, currently at 6500 rpm.  He is not volume overloaded on exam, NYHA class II. MAP elevated.  LVAD parameters stable.  - Hold diuretic with pancreatitis, gentle hydration as above for 1 L.  - With elevated MAP, will need to control pain and continue home spironolactone.  - Continue sildenafil.  - INR goal 2-3 for LVAD, continue warfarin (INR therapeutic).  4. Recurrent DL infection with subxiphoid abscess: s/p debridement and pectoral flap in 4/21.  Had recurrent superficial subxiphoid abscess. CT with posible fluid collection. Evaluated by TCTS And IR and found to be muscle flap with no drainable fluid collection 6/21  - Continue suppressive Keflex.  - He is not a candidate for pump exchange  given severe RV dysfunction and poor self-care 5. Depression: Continue meds.  6. HTN: BP elevated today but in pain.  - Control pain.   - Continue home spironolactone.  7. OSA: Sleep study with very severe OSA (AHI 69/hr).  Continues to refuse therapy. We have arranged f/u in  sleep clinic several times but he has not been compliant 8. H/o GI bleed: 08/2018 EGD completed and showed gastritis and nonbleeding duodenal ulcers, colonoscopy with 5 polyps removed. Protonix was increased to BID. No recent GI bleeding.  - Off ASA, continue warfarin.   9. Tobacco Abuse: Continues to smoke 2-3 cigs/day. - reinforced need to quit   I reviewed the LVAD parameters from today, and compared the results to the patient's prior recorded data.  No programming changes were made.  The LVAD is functioning within specified parameters.  The patient performs LVAD self-test daily.  LVAD interrogation was negative for any significant power changes, alarms or PI events/speed drops.  LVAD equipment check completed and is in good working order.  Back-up equipment present.   LVAD education done on emergency procedures and precautions and reviewed exit site care.  Length of Stay: 0  Marca Ancona 03/15/2020, 2:47 PM  VAD Team Pager [phone] (7am - 7am) +++VAD ISSUES ONLY+++   Advanced Heart Failure Team Pager [phone] (M-F; 7a - 4p)  Please contact [facility] for night-coverage after hours (4p -7a ) and weekends on [email or web address] for all non- LVAD Issues

## 2020-03-15 NOTE — ED Triage Notes (Addendum)
LVAD pt with severe generalized abd pain since yesterday.  Denies nausea, vomiting, and diarrhea.  States he believes he is constipated.  Pt requesting pain medication on arrival.

## 2020-03-15 NOTE — ED Notes (Signed)
IV site reported occluded. IV site readjusted, flushing well with good blood return. Site secured with tegaderm and tape. IV fluids restarted.

## 2020-03-15 NOTE — ED Provider Notes (Signed)
[facility] EMERGENCY DEPARTMENT Provider Note   CSN: [id number] Arrival date & time: 03/15/20  1101     History Chief Complaint  Patient presents with  . Abdominal Pain    Jonathan Wood is a 49 y.o. male.  49 year old male with prior medical history as detailed below presents for evaluation abdominal pain.  Patient reports onset abdominal pain about 24 hours prior to arrival.  He reports associated constipation.  He denies fever.  He denies vomiting.  He denies chest pain or shortness of breath.  Of note, patient is an LVAD patient.  He is followed by the LVAD clinic.  Patient does admit to drinking approximately 1 bottle of liquor about 48 hours prior.  The history is provided by the patient and medical records.  Abdominal Pain Pain location:  Generalized Pain quality: aching   Pain radiates to:  Does not radiate Pain severity:  Moderate Onset quality:  Gradual Duration:  2 days Timing:  Constant Progression:  Worsening Chronicity:  New Context: alcohol use        Past Medical History:  Diagnosis Date  . Asthma   . CHF (congestive heart failure) (HCC)    a. 09/2016: EF 20-25% with cath showing normal cors  . GERD (gastroesophageal reflux disease)   . History of hiatal hernia   . LVAD (left ventricular assist device) present (HCC)   . OSA on CPAP 09/06/2018   Severe OSA with AHI 68/hr on CPAP at 12cm H2O    Patient Active Problem List   Diagnosis Date Noted  . Alcohol use disorder, moderate, in early remission (HCC) 01/01/2020  . Lactic acidosis 12/28/2019  . Hypoglycemia 12/28/2019  . Pain, dental 12/28/2019  . Generalized anxiety disorder 09/03/2019  . Cigarette smoker 08/18/2019  . COPD (chronic obstructive pulmonary disease) (HCC) 08/18/2019  . Hypertension 08/18/2019  . Bacteremia due to methicillin susceptible Staphylococcus aureus (MSSA) 08/18/2019  . Sepsis (HCC) 08/16/2019  . Increased abdominal girth 10/11/2018  .  Chronic constipation 10/11/2018  . Rectal bleeding 10/11/2018  . History of colonic polyps 09/24/2018  . Hemorrhoids 09/24/2018  . Long term current use of anticoagulant therapy 09/24/2018  . OSA on CPAP 09/06/2018  . 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/26/2017  . Anxiety 09/30/2017  . Normocytic anemia 09/30/2017  . LVAD (left ventricular assist device) present (HCC)   . Advanced care planning/counseling discussion   . Goals of care, counseling/discussion   . Palliative care encounter   . Elevated LFTs 08/25/2017    Past Surgical History:  Procedure Laterality Date  . APPLICATION OF A-CELL OF CHEST/ABDOMEN N/A 08/24/2019   Procedure: Application Of A-Cell Of Chest/Abdomen;  Surgeon: Kerin Perna, MD;  Location: [facility] OR;  Service: Thoracic;  Laterality: N/A;  . APPLICATION OF A-CELL OF CHEST/ABDOMEN N/A 09/05/2019   Procedure: Application Of A-Cell Of Chest/Abdomen;  Surgeon: Peggye Form, DO;  Location: [facility] OR;  Service: Plastics;  Laterality: N/A;  . APPLICATION OF A-CELL OF CHEST/ABDOMEN  09/03/2019   Procedure: Application Of A-Cell Of Chest/Abdomen;  Surgeon: Kerin Perna, MD;  Location: [facility] OR;  Service: Thoracic;;  . APPLICATION OF WOUND VAC N/A 08/22/2019   Procedure: Debridement, Irrigation and Packing of Abdominal Incision.;  Surgeon: Kerin Perna, MD;  Location: [facility] OR;  Service: Thoracic;  Laterality: N/A;  . APPLICATION OF WOUND VAC N/A 08/24/2019   Procedure:  Irrigation and Debridement with WOUND VAC APPLICATION;  Surgeon: Kerin Perna, MD;  Location: [facility] OR;  Service: Thoracic;  Laterality: N/A;  . APPLICATION OF WOUND VAC N/A 08/30/2019   Procedure: APPLICATION OF WOUND VAC;  Surgeon: Kerin Perna, MD;  Location: [facility] OR;  Service: Thoracic;  Laterality: N/A;  . APPLICATION OF WOUND VAC N/A 09/03/2019    Procedure: WOUND VAC CHANGE;  Surgeon: Kerin Perna, MD;  Location: [facility] OR;  Service: Thoracic;  Laterality: N/A;  . BIOPSY  08/23/2018   Procedure: BIOPSY;  Surgeon: Lemar Lofty., MD;  Location: [facility] ENDOSCOPY;  Service: Gastroenterology;;  . COLONOSCOPY N/A 08/23/2018   Procedure: COLONOSCOPY;  Surgeon: Lemar Lofty., MD;  Location: [facility] ENDOSCOPY;  Service: Gastroenterology;  Laterality: N/A;  . ENTEROSCOPY N/A 08/23/2018   Procedure: ENTEROSCOPY;  Surgeon: Meridee Score Netty Starring., MD;  Location: [facility] ENDOSCOPY;  Service: Gastroenterology;  Laterality: N/A;  . HEMOSTASIS CLIP PLACEMENT  08/23/2018   Procedure: HEMOSTASIS CLIP PLACEMENT;  Surgeon: Lemar Lofty., MD;  Location: [facility] ENDOSCOPY;  Service: Gastroenterology;;  . IABP INSERTION N/A 09/05/2017   Procedure: IABP INSERTION;  Surgeon: Dolores Patty, MD;  Location: [facility] INVASIVE CV LAB;  Service: Cardiovascular;  Laterality: N/A;  . INSERTION OF IMPLANTABLE LEFT VENTRICULAR ASSIST DEVICE N/A 09/06/2017   Procedure: INSERTION OF IMPLANTABLE LEFT VENTRICULAR ASSIST DEVICE - HM3;  Surgeon: Kerin Perna, MD;  Location: [facility] OR;  Service: Open Heart Surgery;  Laterality: N/A;  HM3  . MUSCLE FLAP CLOSURE N/A 09/05/2019   Procedure: MUSCLE FLAP CLOSURE;  Surgeon: Peggye Form, DO;  Location: [facility] OR;  Service: Plastics;  Laterality: N/A;  . NASAL FRACTURE SURGERY  1987  . POLYPECTOMY  08/23/2018   Procedure: POLYPECTOMY;  Surgeon: Mansouraty, Netty Starring., MD;  Location: [facility] ENDOSCOPY;  Service: Gastroenterology;;  . RIGHT HEART CATH N/A 08/30/2017   Procedure: RIGHT HEART CATH;  Surgeon: Dolores Patty, MD;  Location: [facility] INVASIVE CV LAB;  Service: Cardiovascular;  Laterality: N/A;  . RIGHT/LEFT HEART CATH AND CORONARY ANGIOGRAPHY N/A 10/08/2016   Procedure: Right/Left Heart Cath and Coronary Angiography;  Surgeon: Orpah Cobb, MD;  Location: [facility] INVASIVE CV LAB;  Service: Cardiovascular;  Laterality: N/A;  . STERNAL  WOUND DEBRIDEMENT N/A 08/20/2019   Procedure: DEBRIDEMENT OF LVAD DRIVELINE TUNNEL;  Surgeon: Kerin Perna, MD;  Location: [facility] OR;  Service: Thoracic;  Laterality: N/A;  . STERNAL WOUND DEBRIDEMENT N/A 09/05/2019   Procedure: DEBRIDEMENT AND CLOSURE OF ABDOMINAL WOUND;  Surgeon: Peggye Form, DO;  Location: [facility] OR;  Service: Plastics;  Laterality: N/A;  . SUBMUCOSAL TATTOO INJECTION  08/23/2018   Procedure: SUBMUCOSAL TATTOO INJECTION;  Surgeon: Lemar Lofty., MD;  Location: [facility] ENDOSCOPY;  Service: Gastroenterology;;  . TEE WITHOUT CARDIOVERSION N/A 09/06/2017   Procedure: TRANSESOPHAGEAL ECHOCARDIOGRAM (TEE);  Surgeon: Donata Clay, Theron Arista, MD;  Location: [facility] OR;  Service: Open Heart Surgery;  Laterality: N/A;  . WOUND DEBRIDEMENT N/A 08/30/2019   Procedure: Debridement Abdominal Wound;  Surgeon: Kerin Perna, MD;  Location: [facility] OR;  Service: Thoracic;  Laterality: N/A;       Family History  Problem Relation Age of Onset  . Hypertension Father   . Alcohol abuse Cousin   . Colon cancer Neg Hx   . Esophageal cancer Neg Hx   . Inflammatory bowel disease Neg Hx   . Liver disease Neg Hx   . Pancreatic cancer Neg Hx   . Rectal cancer Neg Hx   . Stomach  cancer Neg Hx   . Diabetes Neg Hx     Social History   Tobacco Use  . Smoking status: Former Smoker    Packs/day: 0.50    Years: 25.00    Pack years: 12.50    Types: Cigarettes  . Smokeless tobacco: Never Used  . Tobacco comment: quit 08-16-2019  Vaping Use  . Vaping Use: Never used  Substance Use Topics  . Alcohol use: Not Currently    Comment: stopped drinking in April 2021  . Drug use: Not Currently    Types: Marijuana    Home Medications Prior to Admission medications   Medication Sig Start Date End Date Taking? Authorizing Provider  albuterol (PROAIR HFA) 108 (90 Base) MCG/ACT inhaler Inhale 2 puffs into the lungs every 6 (six) hours as needed for wheezing or shortness of breath. 11/13/19   Bensimhon, Bevelyn Buckles,  MD  cephALEXin (KEFLEX) 500 MG capsule Take 1 capsule (500 mg total) by mouth 3 (three) times daily. 10/15/19   Cliffton Asters, MD  docusate sodium (STOOL SOFTENER) 100 MG capsule Take 1 capsule (100 mg total) by mouth 2 (two) times daily. Patient taking differently: Take 100 mg by mouth daily as needed for mild constipation.  12/06/19   Bensimhon, Bevelyn Buckles, MD  DULoxetine (CYMBALTA) 60 MG capsule Take 1 capsule (60 mg total) by mouth daily. 01/06/20   Bhagat, Sharrell Ku, PA  gabapentin (NEURONTIN) 300 MG capsule TAKE ONE CAPSULE BY MOUTH THREE TIMES DAILY Patient taking differently: Take 300 mg by mouth 3 (three) times daily.  06/07/19   Bensimhon, Bevelyn Buckles, MD  hydrOXYzine (ATARAX/VISTARIL) 25 MG tablet Take 2 tablets (50 mg total) by mouth at bedtime as needed for anxiety. 01/14/20   Bensimhon, Bevelyn Buckles, MD  pantoprazole (PROTONIX) 40 MG tablet Take 1 tablet (40 mg total) by mouth 2 (two) times daily. 01/06/20   Bhagat, Sharrell Ku, PA  sildenafil (REVATIO) 20 MG tablet Take 1 tablet (20 mg total) by mouth 3 (three) times daily. 02/27/20   Bensimhon, Bevelyn Buckles, MD  spironolactone (ALDACTONE) 25 MG tablet Take 0.5 tablets (12.5 mg total) by mouth daily. 01/06/20   Manson Passey, PA  torsemide (DEMADEX) 20 MG tablet Take 1 tablet (20 mg total) by mouth daily. Patient taking differently: Take 20 mg by mouth daily. Patient taking every other day 02/05/20   Bensimhon, Bevelyn Buckles, MD  warfarin (COUMADIN) 5 MG tablet Take 5 mg (1 tab) every Monday, Wed, and Friday and 2.5 mg (0.5 tab) all other days or as directed by HF Clinic. Patient taking differently: Take 5 mg by mouth See admin instructions. Take 5 mg (1 tab) every Monday, Wed, and Friday and 2.5 mg (0.5 tab) all other days or as directed by HF Clinic. 11/30/19   Evon Slack, RPH-CPP    Allergies    Patient has no known allergies.  Review of Systems   Review of Systems  Gastrointestinal: Positive for abdominal pain.  All other systems reviewed  and are negative.   Physical Exam Updated Vital Signs Pulse (!) 109   Temp 97.8 F (36.6 C) (Oral)   Resp 20   SpO2 98%   Physical Exam Vitals and nursing note reviewed.  Constitutional:      General: He is not in acute distress.    Appearance: He is well-developed.  HENT:     Head: Normocephalic and atraumatic.  Eyes:     Conjunctiva/sclera: Conjunctivae normal.     Pupils: Pupils are equal, round, and reactive  to light.  Cardiovascular:     Rate and Rhythm: Normal rate and regular rhythm.     Heart sounds: Normal heart sounds.  Pulmonary:     Effort: Pulmonary effort is normal. No respiratory distress.     Breath sounds: Normal breath sounds.  Abdominal:     General: There is no distension.     Palpations: Abdomen is soft.     Tenderness: There is generalized abdominal tenderness.  Musculoskeletal:        General: No deformity. Normal range of motion.     Cervical back: Normal range of motion and neck supple.  Skin:    General: Skin is warm and dry.  Neurological:     Mental Status: He is alert and oriented to person, place, and time.     ED Results / Procedures / Treatments   Labs (all labs ordered are listed, but only abnormal results are displayed) Labs Reviewed  LIPASE, BLOOD - Abnormal; Notable for the following components:      Result Value   Lipase 367 (*)    All other components within normal limits  COMPREHENSIVE METABOLIC PANEL - Abnormal; Notable for the following components:   Sodium 132 (*)    Potassium 3.2 (*)    CO2 20 (*)    Glucose, Bld 126 (*)    Total Protein 9.1 (*)    Alkaline Phosphatase 138 (*)    Total Bilirubin 2.3 (*)    All other components within normal limits  CBC - Abnormal; Notable for the following components:   WBC 21.0 (*)    RDW 19.5 (*)    All other components within normal limits  URINALYSIS, ROUTINE W REFLEX MICROSCOPIC - Abnormal; Notable for the following components:   Color, Urine AMBER (*)    APPearance HAZY  (*)    Specific Gravity, Urine 1.040 (*)    Protein, ur 100 (*)    All other components within normal limits  PROTIME-INR - Abnormal; Notable for the following components:   Prothrombin Time 25.6 (*)    INR 2.4 (*)    All other components within normal limits  PROTIME-INR - Abnormal; Notable for the following components:   Prothrombin Time 24.0 (*)    INR 2.2 (*)    All other components within normal limits  LACTATE DEHYDROGENASE - Abnormal; Notable for the following components:   LDH 246 (*)    All other components within normal limits  COMPREHENSIVE METABOLIC PANEL - Abnormal; Notable for the following components:   Sodium 132 (*)    Potassium 3.3 (*)    CO2 21 (*)    Total Protein 9.0 (*)    Alkaline Phosphatase 134 (*)    Total Bilirubin 1.7 (*)    All other components within normal limits  CBC - Abnormal; Notable for the following components:   WBC 20.8 (*)    RDW 19.9 (*)    All other components within normal limits  LACTATE DEHYDROGENASE - Abnormal; Notable for the following components:   LDH 197 (*)    All other components within normal limits  PROTIME-INR - Abnormal; Notable for the following components:   Prothrombin Time 25.9 (*)    INR 2.5 (*)    All other components within normal limits  CBC - Abnormal; Notable for the following components:   WBC 23.2 (*)    RDW 19.3 (*)    All other components within normal limits  LACTATE DEHYDROGENASE - Abnormal; Notable for the following components:  LDH 198 (*)    All other components within normal limits  PROTIME-INR - Abnormal; Notable for the following components:   Prothrombin Time 28.7 (*)    INR 2.8 (*)    All other components within normal limits  CBC - Abnormal; Notable for the following components:   WBC 28.6 (*)    Hemoglobin 12.7 (*)    RDW 18.8 (*)    All other components within normal limits  BASIC METABOLIC PANEL - Abnormal; Notable for the following components:   Sodium 130 (*)    Potassium 3.4 (*)     Chloride 97 (*)    Calcium 8.7 (*)    All other components within normal limits  RESPIRATORY PANEL BY RT PCR (FLU A&B, COVID)  MRSA PCR SCREENING  MAGNESIUM  PHOSPHORUS  LIPASE, BLOOD  GLUCOSE, CAPILLARY  BASIC METABOLIC PANEL  LACTATE DEHYDROGENASE  PROTIME-INR  BASIC METABOLIC PANEL  CBC WITH DIFFERENTIAL/PLATELET  PROCALCITONIN    EKG None  Radiology CT Abdomen Pelvis W Contrast  Result Date: 03/15/2020 CLINICAL DATA:  Left lower quadrant abdominal pain for 1 day. EXAM: CT ABDOMEN AND PELVIS WITH CONTRAST TECHNIQUE: Multidetector CT imaging of the abdomen and pelvis was performed using the standard protocol following bolus administration of intravenous contrast. CONTRAST:  OMNIPAQUE IOHEXOL 300 MG/ML  SOLN COMPARISON:  CT abdomen pelvis dated 12/27/2019 and 11/14/2019. FINDINGS: Lower chest: There is mild bibasilar atelectasis. A left ventricular assist device is partially imaged and results in streak artifact which obscures the upper abdomen/lower thorax. Hepatobiliary: No focal liver abnormality is seen. No gallstones, gallbladder wall thickening, or biliary dilatation. Pancreas: The pancreas is edematous with surrounding edema and a small amount of fluid in the left retro colic gutter. There is fatty replacement of the pancreas, however the visible parenchyma appears to be normally enhancing and there is no definite evidence of pancreatic necrosis. There is no acute peripancreatic fluid collection. The pancreatic duct does not appear dilated. Spleen: Normal in size without focal abnormality. Adrenals/Urinary Tract: Adrenal glands are unremarkable. Other than left renal cysts which measure up to 2.2 cm, the kidneys are normal, without renal calculi, focal lesion, or hydronephrosis. Bladder is unremarkable. Stomach/Bowel: Stomach is within normal limits. No pericecal inflammatory changes are noted to suggest acute appendicitis. There is mild bowel wall thickening of the proximal  portion of the descending colon which is likely reactive given the adjacent inflammatory changes from the patient's pancreatitis. No evidence of bowel obstruction. Vascular/Lymphatic: No significant vascular findings are present. No portal vein thrombosis or splenic artery aneurysm is identified. No enlarged abdominal or pelvic lymph nodes. Reproductive: Prostate is unremarkable. Other: No abdominal wall hernia or abnormality. Musculoskeletal: No acute or significant osseous findings. IMPRESSION: 1. Acute interstitial edematous pancreatitis. No evidence of pancreatic necrosis or acute peripancreatic fluid collection. Electronically Signed   By: Romona Curls M.D.   On: 03/15/2020 13:09    Procedures Procedures (including critical care time)  Medications Ordered in ED Medications  HYDROmorphone (DILAUDID) injection 0.5 mg (has no administration in time range)  morphine 4 MG/ML injection 4 mg (4 mg Intravenous Given 03/15/20 1216)  iohexol (OMNIPAQUE) 300 MG/ML solution 100 mL (100 mLs Intravenous Contrast Given 03/15/20 1247)    ED Course  I have reviewed the triage vital signs and the nursing notes.  Pertinent labs & imaging results that were available during my care of the patient were reviewed by me and considered in my medical decision making (see chart for details).  MDM Rules/Calculators/A&P                          MDM  Screen complete  TYSEN OLIVIA was evaluated in Emergency Department on 03/15/2020 for the symptoms described in the history of present illness. He was evaluated in the context of the global COVID-19 pandemic, which necessitated consideration that the patient might be at risk for infection with the SARS-CoV-2 virus that causes COVID-19. Institutional protocols and algorithms that pertain to the evaluation of patients at risk for COVID-19 are in a state of rapid change based on information released by regulatory bodies including the CDC and federal and state  organizations. These policies and algorithms were followed during the patient's care in the ED.   Patient is presenting for evaluation of reported abdominal pain.  Work-up and CT imaging suggest pancreatitis.  This is likely related the patient's reported recent ingestion of alcohol.  Dr. Jearld Pies of the LVAD service will admit the patient.  He requests hospitalist consult for management of pancreatitis.  Hospitalist service is aware of consult request.   Final Clinical Impression(s) / ED Diagnoses Final diagnoses:  Acute pancreatitis, unspecified complication status, unspecified pancreatitis type  CHF (congestive heart failure) (HCC)    Rx / DC Orders ED Discharge Orders    None       Wynetta Fines, MD 03/17/20 2026

## 2020-03-16 DIAGNOSIS — Z95811 Presence of heart assist device: Secondary | ICD-10-CM | POA: Diagnosis not present

## 2020-03-16 DIAGNOSIS — K8500 Idiopathic acute pancreatitis without necrosis or infection: Secondary | ICD-10-CM | POA: Diagnosis not present

## 2020-03-16 LAB — CBC
HCT: 42.6 % (ref 39.0–52.0)
Hemoglobin: 13.6 g/dL (ref 13.0–17.0)
MCH: 26.9 pg (ref 26.0–34.0)
MCHC: 31.9 g/dL (ref 30.0–36.0)
MCV: 84.4 fL (ref 80.0–100.0)
Platelets: 182 10*3/uL (ref 150–400)
RBC: 5.05 MIL/uL (ref 4.22–5.81)
RDW: 19.3 % — ABNORMAL HIGH (ref 11.5–15.5)
WBC: 23.2 10*3/uL — ABNORMAL HIGH (ref 4.0–10.5)
nRBC: 0 % (ref 0.0–0.2)

## 2020-03-16 LAB — LACTATE DEHYDROGENASE: LDH: 197 U/L — ABNORMAL HIGH (ref 98–192)

## 2020-03-16 LAB — MRSA PCR SCREENING: MRSA by PCR: NEGATIVE

## 2020-03-16 LAB — PROTIME-INR
INR: 2.5 — ABNORMAL HIGH (ref 0.8–1.2)
Prothrombin Time: 25.9 seconds — ABNORMAL HIGH (ref 11.4–15.2)

## 2020-03-16 MED ORDER — OXYCODONE HCL 5 MG PO TABS
5.0000 mg | ORAL_TABLET | ORAL | Status: DC | PRN
  Administered 2020-03-16 – 2020-03-20 (×12): 5 mg via ORAL
  Filled 2020-03-16 (×12): qty 1

## 2020-03-16 MED ORDER — WARFARIN SODIUM 2.5 MG PO TABS
2.5000 mg | ORAL_TABLET | Freq: Once | ORAL | Status: AC
  Administered 2020-03-16: 2.5 mg via ORAL
  Filled 2020-03-16: qty 1

## 2020-03-16 MED ORDER — HYDROMORPHONE HCL 1 MG/ML IJ SOLN: 0.5000 mg | INTRAMUSCULAR | Status: DC | PRN

## 2020-03-16 MED ORDER — SPIRONOLACTONE 12.5 MG HALF TABLET
12.5000 mg | ORAL_TABLET | Freq: Every day | ORAL | Status: DC
  Administered 2020-03-16 – 2020-03-20 (×5): 12.5 mg via ORAL
  Filled 2020-03-16 (×5): qty 1

## 2020-03-16 MED ORDER — WARFARIN - PHARMACIST DOSING INPATIENT: Freq: Every day | Status: DC

## 2020-03-16 MED ORDER — HYDROMORPHONE HCL 1 MG/ML IJ SOLN
1.0000 mg | INTRAMUSCULAR | Status: DC | PRN
  Administered 2020-03-16 – 2020-03-17 (×7): 1 mg via INTRAVENOUS
  Filled 2020-03-16 (×8): qty 1

## 2020-03-16 NOTE — Progress Notes (Signed)
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  Pain is described as dull.  Onset 03/13/2020 while watching football after drinking approximately 1 pint of alcohol.  Denies any previous symptoms previously.  In the ED, patient afebrile.  Lipase elevated 367.  CT abdomen/pelvis with acute interstitial edematous pancreatitis without evidence of pancreatic necrosis or peripancreatic fluid collection.  Admitted by heart failure service.  TRH consulted for assistance with management of acute pancreatitis.   Assessment & Plan:   Principal Problem:   Acute pancreatitis Active Problems:   LVAD (left ventricular assist device) present (HCC)   Alcohol use disorder, moderate, in early remission (HCC)   Acute alcoholic pancreatitis Patient presenting to the ED with acute onset abdominal pain radiating towards his back after drinking approximately 1 pint of alcohol.  Lipase elevated at 367 with CT findings of acute interstitial edematous pancreatitis without necrosis or peripancreatic fluid collection. --Currently on LR 4mL/h x 1 day given severity of heart failure --CLD, currently tolerating w/o nausea or vomiting - continue diet for one more day before consideration of advancement --Start oxycodone 5 mg q4h prn moderate pain given he is tolerating his current diet --dilaudid 0.5mg  IV q3h prn severe breakthrough pain --Discontinued IV fentanyl, patient states ineffective --Antiemetics as needed --Discussed need for complete alcohol cessation  EtOH use  disorder Patient with continued alcohol use, 1 pint reported on 03/13/2020.  Endorses previous tremors following alcohol cessation, but denies any history of seizures from alcohol withdrawal. --CIWAA protocol with symptom triggered Ativan --Folic acid, thiamine, multivitamin --Continue to encourage EtOH cessation  Tobacco use disorder Continues to endorse 2-3 cigarettes/day.  Counseled on need for complete cessation.  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  Treated with Ancef and p.o. rifabutin, continues on chronic Keflex suppression therapy. --Further per cardiology  Appreciate the opportunity to participate in the care of this most interesting patient.  We will continue to follow along with you.  DVT prophylaxis: SCDs Code Status:   Code Status: Partial Code Family Communication: None present at bedside  Disposition Plan: Per primary cardiology   Procedures:   None  Antimicrobials:   On Keflex for chronic suppression   Subjective: Patient seen and examined at bedside, resting comfortably.  Continues to complain of diffuse abdominal discomfort.  Has been able to tolerate his clear liquid diet without nausea or vomiting.  Reports fentanyl ineffective pain control and Dilaudid works but wears off very quickly.  Discussed with patient extensively needs complete alcohol cessation as this is the precipitating factor to his underlying pancreatitis.  No other complaints or concerns at this time.  Denies headache, no fever/chills/night sweats, no nausea/vomiting/diarrhea, no chest pain, no palpitations, no shortness of breath, no weakness, no fatigue.  No acute events overnight per nursing staff.  Objective: Vitals:    03/16/20 0400 03/16/20 0500 03/16/20 0546 03/16/20 0737  BP: (!) 120/94 106/80  (!) 114/93  Pulse: Marland Kitchen)  110   (!) 120  Resp: 20  19 20   Temp: 99 F (37.2 C)   98.4 F (36.9 C)  TempSrc: Oral   Oral  SpO2: 97%   96%  Weight:      Height:        Intake/Output Summary (Last 24 hours) at 03/16/2020 0938 Last data filed at 03/16/2020 0745 Gross per 24 hour  Intake 1004.67 ml  Output 200 ml  Net 804.67 ml   Filed Weights   03/16/20 0117  Weight: 88.9 kg    Examination:  General exam: Appears calm and comfortable  Respiratory system: Clear to auscultation. Respiratory effort normal. Cardiovascular system: LVAD hum appreciated, no peripheral edema, LVAD noted in place Gastrointestinal system: Abdomen is nondistended, soft, mild diffuse tenderness, especially lower mid abdomen. No organomegaly or masses felt. Normal bowel sounds heard. Central nervous system: Alert and oriented. No focal neurological deficits. Extremities: Symmetric 5 x 5 power. Skin: No rashes, lesions or ulcers Psychiatry: Judgement and insight appear normal. Mood & affect appropriate.     Data Reviewed: I have personally reviewed following labs and imaging studies  CBC: Recent Labs  Lab 03/12/20 1005 03/15/20 1128 03/15/20 1538 03/16/20 0123  WBC 6.2 21.0* 20.8* 23.2*  HGB 12.3* 15.0 15.1 13.6  HCT 40.2 47.1 48.3 42.6  MCV 86.5 83.4 85.5 84.4  PLT 173 203 207 182   Basic Metabolic Panel: Recent Labs  Lab 03/12/20 1005 03/15/20 1128 03/15/20 1538  NA 138 132* 132*  K 3.7 3.2* 3.3*  CL 105 98 98  CO2 20* 20* 21*  GLUCOSE 120* 126* 91  BUN 9 12 12   CREATININE 0.98 1.15 1.07  CALCIUM 8.6* 9.1 8.9  MG  --   --  1.8  PHOS  --   --  3.3   GFR: Estimated Creatinine Clearance: 99.2 mL/min (by C-G formula based on SCr of 1.07 mg/dL). Liver Function Tests: Recent Labs  Lab 03/12/20 1005 03/15/20 1128 03/15/20 1538  AST 29 20 20   ALT 13 12 13   ALKPHOS 118 138* 134*  BILITOT 0.9 2.3* 1.7*   PROT 7.3 9.1* 9.0*  ALBUMIN 3.3* 3.9 3.9   Recent Labs  Lab 03/15/20 1128  LIPASE 367*   No results for input(s): AMMONIA in the last 168 hours. Coagulation Profile: Recent Labs  Lab 03/12/20 1005 03/15/20 1302 03/15/20 1538 03/16/20 0123  INR 2.8* 2.4* 2.2* 2.5*   Cardiac Enzymes: No results for input(s): CKTOTAL, CKMB, CKMBINDEX, TROPONINI in the last 168 hours. BNP (last 3 results) No results for input(s): PROBNP in the last 8760 hours. HbA1C: No results for input(s): HGBA1C in the last 72 hours. CBG: No results for input(s): GLUCAP in the last 168 hours. Lipid Profile: No results for input(s): CHOL, HDL, LDLCALC, TRIG, CHOLHDL, LDLDIRECT in the last 72 hours. Thyroid Function Tests: No results for input(s): TSH, T4TOTAL, FREET4, T3FREE, THYROIDAB in the last 72 hours. Anemia Panel: No results for input(s): VITAMINB12, FOLATE, FERRITIN, TIBC, IRON, RETICCTPCT in the last 72 hours. Sepsis Labs: No results for input(s): PROCALCITON, LATICACIDVEN in the last 168 hours.  Recent Results (from the past 240 hour(s))  Respiratory Panel by RT PCR (Flu A&B, Covid) - Nasopharyngeal Swab     Status: None   Collection Time: 03/15/20  4:26 PM   Specimen: Nasopharyngeal Swab  Result Value Ref Range Status   SARS Coronavirus 2 by RT PCR NEGATIVE NEGATIVE Final    Comment: (NOTE) SARS-CoV-2 target nucleic acids are NOT DETECTED.  The SARS-CoV-2 RNA is generally detectable in upper respiratoy specimens during the acute phase of infection. The lowest concentration of SARS-CoV-2 viral copies this assay can detect is 131 copies/mL. A negative result does not preclude SARS-Cov-2 infection and should not be used as the sole basis for treatment or other patient management decisions. A negative result may occur with  improper specimen collection/handling, submission of specimen other than nasopharyngeal swab, presence of viral mutation(s) within the areas targeted by this assay, and  inadequate number of viral copies (<131 copies/mL). A negative result must be combined with clinical observations, patient history, and epidemiological information. The expected result is Negative.  Fact Sheet for Patients:  https://www.moore.com/  Fact Sheet for Healthcare Providers:  https://www.young.biz/  This test is no t yet approved or cleared by the [place] FDA and  has been authorized for detection and/or diagnosis of SARS-CoV-2 by FDA under an Emergency Use Authorization (EUA). This EUA will remain  in effect (meaning this test can be used) for the duration of the COVID-19 declaration under Section 564(b)(1) of the Act, 21 U.S.C. section 360bbb-3(b)(1), unless the authorization is terminated or revoked sooner.     Influenza A by PCR NEGATIVE NEGATIVE Final   Influenza B by PCR NEGATIVE NEGATIVE Final    Comment: (NOTE) The Xpert Xpress SARS-CoV-2/FLU/RSV assay is intended as an aid in  the diagnosis of influenza from Nasopharyngeal swab specimens and  should not be used as a sole basis for treatment. Nasal washings and  aspirates are unacceptable for Xpert Xpress SARS-CoV-2/FLU/RSV  testing.  Fact Sheet for Patients: https://www.moore.com/  Fact Sheet for Healthcare Providers: https://www.young.biz/  This test is not yet approved or cleared by the [place] FDA and  has been authorized for detection and/or diagnosis of SARS-CoV-2 by  FDA under an Emergency Use Authorization (EUA). This EUA will remain  in effect (meaning this test can be used) for the duration of the  Covid-19 declaration under Section 564(b)(1) of the Act, 21  U.S.C. section 360bbb-3(b)(1), unless the authorization is  terminated or revoked. Performed at [facility], [address] [place], [place] [address]   MRSA PCR Screening     Status: None   Collection Time: 03/16/20  4:28 AM   Specimen: Nasal  Mucosa; Nasopharyngeal  Result Value Ref Range Status   MRSA by PCR NEGATIVE NEGATIVE Final    Comment:        The GeneXpert MRSA Assay (FDA approved for NASAL specimens only), is one component of a comprehensive MRSA colonization surveillance program. It is not intended to diagnose MRSA infection nor to guide or monitor treatment for MRSA infections. Performed at [facility], [address] [place], [place] [address]          Radiology Studies: CT Abdomen Pelvis W Contrast  Result Date: 03/15/2020 CLINICAL DATA:  Left lower quadrant abdominal pain for 1 day. EXAM: CT ABDOMEN AND PELVIS WITH CONTRAST TECHNIQUE: Multidetector CT imaging of the abdomen and pelvis was performed using the standard protocol following bolus administration of intravenous contrast. CONTRAST:  OMNIPAQUE IOHEXOL 300 MG/ML  SOLN COMPARISON:  CT abdomen pelvis dated 12/27/2019 and 11/14/2019. FINDINGS: Lower chest: There is mild bibasilar atelectasis. A left ventricular assist device is partially imaged and results in streak artifact which obscures the upper abdomen/lower thorax. Hepatobiliary: No focal liver abnormality is seen. No gallstones, gallbladder wall thickening, or biliary dilatation. Pancreas: The pancreas is edematous with surrounding edema and a small amount of  fluid in the left retro colic gutter. There is fatty replacement of the pancreas, however the visible parenchyma appears to be normally enhancing and there is no definite evidence of pancreatic necrosis. There is no acute peripancreatic fluid collection. The pancreatic duct does not appear dilated. Spleen: Normal in size without focal abnormality. Adrenals/Urinary Tract: Adrenal glands are unremarkable. Other than left renal cysts which measure up to 2.2 cm, the kidneys are normal, without renal calculi, focal lesion, or hydronephrosis. Bladder is unremarkable. Stomach/Bowel: Stomach is within normal limits. No pericecal inflammatory  changes are noted to suggest acute appendicitis. There is mild bowel wall thickening of the proximal portion of the descending colon which is likely reactive given the adjacent inflammatory changes from the patient's pancreatitis. No evidence of bowel obstruction. Vascular/Lymphatic: No significant vascular findings are present. No portal vein thrombosis or splenic artery aneurysm is identified. No enlarged abdominal or pelvic lymph nodes. Reproductive: Prostate is unremarkable. Other: No abdominal wall hernia or abnormality. Musculoskeletal: No acute or significant osseous findings. IMPRESSION: 1. Acute interstitial edematous pancreatitis. No evidence of pancreatic necrosis or acute peripancreatic fluid collection. Electronically Signed   By: Romona Curls M.D.   On: 03/15/2020 13:09   DG Chest Port 1 View  Result Date: 03/15/2020 CLINICAL DATA:  LVAD.  Generalized abdominal pain. EXAM: PORTABLE CHEST 1 VIEW COMPARISON:  12/27/2019 FINDINGS: LVAD in place, unchanged. Prior median sternotomy. Cardiomegaly. No confluent opacities or effusions. Lingular scarring. IMPRESSION: Cardiomegaly.  No active disease. Electronically Signed   By: Charlett Nose M.D.   On: 03/15/2020 17:05        Scheduled Meds: . cephALEXin  500 mg Oral TID  . docusate sodium  100 mg Oral BID  . DULoxetine  60 mg Oral Daily  . folic acid  1 mg Oral Daily  . gabapentin  300 mg Oral TID  . multivitamin with minerals  1 tablet Oral Daily  . pantoprazole  40 mg Oral BID  . sildenafil  20 mg Oral TID  . spironolactone  12.5 mg Oral Daily  . thiamine  100 mg Oral Daily   Continuous Infusions: . lactated ringers 40 mL/hr at 03/16/20 0032     LOS: 1 day    Time spent: 36 minutes spent on chart review, discussion with nursing staff, consultants, updating family and interview/physical exam; more than 50% of that time was spent in counseling and/or coordination of care.    Alvira Philips [place], DO [facility] Available  via Epic secure chat 7am-7pm After these hours, please refer to coverage provider listed on [email or web address] 03/16/2020, 9:38 AM

## 2020-03-16 NOTE — Progress Notes (Signed)
ANTICOAGULATION CONSULT NOTE - Initial Consult  Pharmacy Consult for warfarin Indication: LVAD  No Known Allergies  Patient Measurements: Height: 6\' 2"  (188 cm) Weight: 88.9 kg (195 lb 15.8 oz) IBW/kg (Calculated) : 82.2  Vital Signs: Temp: 98.5 F (36.9 C) (10/31 1200) Temp Source: Oral (10/31 1200) BP: 95/64 (10/31 1200) Pulse Rate: 107 (10/31 1200)  Labs: Recent Labs    03/15/20 1128 03/15/20 1128 03/15/20 1302 03/15/20 1538 03/16/20 0123  HGB 15.0   < >  --  15.1 13.6  HCT 47.1  --   --  48.3 42.6  PLT 203  --   --  207 182  LABPROT  --   --  25.6* 24.0* 25.9*  INR  --   --  2.4* 2.2* 2.5*  CREATININE 1.15  --   --  1.07  --    < > = values in this interval not displayed.    Estimated Creatinine Clearance: 99.2 mL/min (by C-G formula based on SCr of 1.07 mg/dL).   Medical History: Past Medical History:  Diagnosis Date  . Asthma   . CHF (congestive heart failure) (HCC)    a. 09/2016: EF 20-25% with cath showing normal cors  . GERD (gastroesophageal reflux disease)   . History of hiatal hernia   . LVAD (left ventricular assist device) present (HCC)   . OSA on CPAP 09/06/2018   Severe OSA with AHI 68/hr on CPAP at 12cm H2O    Assessment: 49 YOM presenting with abdominal pain, LVAD pt on warfarin PTA with last dose taken today 10/30, INR 2.2 on admission.  CBC wnl.    INR up to 2.5 today, therapeutic. H/H, plt drop due to fluids. No reported bleeding.   Pt rerpoted PTA dosing: 7.5mg  MWF, 5mg  all other days but 03/12/20 Anticoag visit dose is 5 mg MWF, 2.5mg  TTSS  Goal of Therapy:  INR 2.0-2.5 per HF team Monitor platelets by anticoagulation protocol: Yes   Plan:  Warfarin 2.5mg  x1 per Anticoag clinic dose  Monitor daily INR, CBC/plt Monitor for signs/symptoms of bleeding    , PharmD, BCPS, BCCP Clinical Pharmacist  Please check AMION for all [facility] phone numbers After 10:00 PM, call Main Pharmacy [phone]

## 2020-03-16 NOTE — Progress Notes (Signed)
LVAD Coordinator Rounding Note:  Admitted 03/15/20 to Dr. Conception Chancy due to abdominal pain from non-nectrotizing acute pancreatitis.   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 Maren Beach under Destination Therapy criteria.  Pt awake, lying in bed. Says he received pain medication for abdominal pain and is starting to feel better.   Vital signs: Temp: 98.5 HR: 116 Doppler Pressure: not done Automatic BP: 114/93 (101) O2 Sat: 93% RA Wt:  195.9 lbs   LVAD interrogation reveals:  Speed: 6500 Flow: 6.6 Power:  6.0w PI: 2.1 Alarms: no external power Events:  18 PI events on 03/15/20 Hematocrit: 35  Fixed speed: 6500 Low speed limit: 6000  Drive Line: dressing C/D/I with anchor intact and accurately applied. Weekly dressing changes per [facility] nurse; next dressing change due 03/19/20.  Labs:  LDH trend: 246>197  INR trend: 2.2>2.5  Anticoagulation Plan: -INR Goal: 2.0 - 2.5 -ASA Dose: none  Device:  N/A  Plan/Recommendations:  1. Call VAD pager for VAD equipment or drive line issues. 2. Weekly dressing changes per BS nurse; next dressing change due Wednesday, 03/19/20.  Hessie Diener RN, VAD Coordinator 24/7 VAD Pager: [phone]

## 2020-03-16 NOTE — Progress Notes (Signed)
Patient ID: Jonathan Wood, male   DOB: 08-29-1970, 49 y.o.   MRN: [id number]   Advanced Heart Failure VAD Team Note  PCP-Cardiologist: Arvilla Meres, MD   Subjective:    Still with diffuse abdominal pain, requiring regular pain meds, Dilaudid works best but wears off quickly.   LVAD INTERROGATION:  HeartMate 3 LVAD:   Flow 6.5 liters/min, speed 6500, power 6.1, PI 2.4.  3 PI events.    Objective:    Vital Signs:   Temp:  [97.8 F (36.6 C)-99 F (37.2 C)] 98.4 F (36.9 C) (10/31 0737) Pulse Rate:  [53-120] 120 (10/31 0737) Resp:  [16-35] 20 (10/31 0737) BP: (106-156)/(72-118) 114/93 (10/31 0737) SpO2:  [91 %-98 %] 96 % (10/31 0737) Weight:  [88.9 kg] 88.9 kg (10/31 0117)   Mean arterial Pressure 90s  Intake/Output:   Intake/Output Summary (Last 24 hours) at 03/16/2020 0851 Last data filed at 03/16/2020 0745 Gross per 24 hour  Intake 1004.67 ml  Output 200 ml  Net 804.67 ml     Physical Exam    General:  Well appearing. No resp difficulty HEENT: normal Neck: supple. JVP 8. Carotids 2+ bilat; no bruits. No lymphadenopathy or thyromegaly appreciated. Cor: Mechanical heart sounds with LVAD hum present. Lungs: clear Abdomen: soft, nondistended but with peri-umbilical tenderness (no rebound, guarding). No hepatosplenomegaly. No bruits or masses. Good bowel sounds. Driveline: C/D/I; securement device intact and driveline incorporated Extremities: no cyanosis, clubbing, rash, edema Neuro: alert & orientedx3, cranial nerves grossly intact. moves all 4 extremities w/o difficulty. Affect pleasant   Telemetry   NSR 100s (personally reviewed)  Labs   Basic Metabolic Panel: Recent Labs  Lab 03/12/20 1005 03/15/20 1128 03/15/20 1538  NA 138 132* 132*  K 3.7 3.2* 3.3*  CL 105 98 98  CO2 20* 20* 21*  GLUCOSE 120* 126* 91  BUN 9 12 12   CREATININE 0.98 1.15 1.07  CALCIUM 8.6* 9.1 8.9  MG  --   --  1.8  PHOS  --   --  3.3    Liver Function Tests: Recent  Labs  Lab 03/12/20 1005 03/15/20 1128 03/15/20 1538  AST 29 20 20   ALT 13 12 13   ALKPHOS 118 138* 134*  BILITOT 0.9 2.3* 1.7*  PROT 7.3 9.1* 9.0*  ALBUMIN 3.3* 3.9 3.9   Recent Labs  Lab 03/15/20 1128  LIPASE 367*   No results for input(s): AMMONIA in the last 168 hours.  CBC: Recent Labs  Lab 03/12/20 1005 03/15/20 1128 03/15/20 1538 03/16/20 0123  WBC 6.2 21.0* 20.8* 23.2*  HGB 12.3* 15.0 15.1 13.6  HCT 40.2 47.1 48.3 42.6  MCV 86.5 83.4 85.5 84.4  PLT 173 203 207 182    INR: Recent Labs  Lab 03/12/20 1005 03/15/20 1302 03/15/20 1538 03/16/20 0123  INR 2.8* 2.4* 2.2* 2.5*    Other results:  EKG:    Imaging   CT Abdomen Pelvis W Contrast  Result Date: 03/15/2020 CLINICAL DATA:  Left lower quadrant abdominal pain for 1 day. EXAM: CT ABDOMEN AND PELVIS WITH CONTRAST TECHNIQUE: Multidetector CT imaging of the abdomen and pelvis was performed using the standard protocol following bolus administration of intravenous contrast. CONTRAST:  03/17/20 OMNIPAQUE IOHEXOL 300 MG/ML  SOLN COMPARISON:  CT abdomen pelvis dated 12/27/2019 and 11/14/2019. FINDINGS: Lower chest: There is mild bibasilar atelectasis. A left ventricular assist device is partially imaged and results in streak artifact which obscures the upper abdomen/lower thorax. Hepatobiliary: No focal liver abnormality is seen. No  gallstones, gallbladder wall thickening, or biliary dilatation. Pancreas: The pancreas is edematous with surrounding edema and a small amount of fluid in the left retro colic gutter. There is fatty replacement of the pancreas, however the visible parenchyma appears to be normally enhancing and there is no definite evidence of pancreatic necrosis. There is no acute peripancreatic fluid collection. The pancreatic duct does not appear dilated. Spleen: Normal in size without focal abnormality. Adrenals/Urinary Tract: Adrenal glands are unremarkable. Other than left renal cysts which measure up to  2.2 cm, the kidneys are normal, without renal calculi, focal lesion, or hydronephrosis. Bladder is unremarkable. Stomach/Bowel: Stomach is within normal limits. No pericecal inflammatory changes are noted to suggest acute appendicitis. There is mild bowel wall thickening of the proximal portion of the descending colon which is likely reactive given the adjacent inflammatory changes from the patient's pancreatitis. No evidence of bowel obstruction. Vascular/Lymphatic: No significant vascular findings are present. No portal vein thrombosis or splenic artery aneurysm is identified. No enlarged abdominal or pelvic lymph nodes. Reproductive: Prostate is unremarkable. Other: No abdominal wall hernia or abnormality. Musculoskeletal: No acute or significant osseous findings. IMPRESSION: 1. Acute interstitial edematous pancreatitis. No evidence of pancreatic necrosis or acute peripancreatic fluid collection. Electronically Signed   By: Romona Curls M.D.   On: 03/15/2020 13:09   DG Chest Port 1 View  Result Date: 03/15/2020 CLINICAL DATA:  LVAD.  Generalized abdominal pain. EXAM: PORTABLE CHEST 1 VIEW COMPARISON:  12/27/2019 FINDINGS: LVAD in place, unchanged. Prior median sternotomy. Cardiomegaly. No confluent opacities or effusions. Lingular scarring. IMPRESSION: Cardiomegaly.  No active disease. Electronically Signed   By: Charlett Nose M.D.   On: 03/15/2020 17:05      Medications:     Scheduled Medications: . cephALEXin  500 mg Oral TID  . docusate sodium  100 mg Oral BID  . DULoxetine  60 mg Oral Daily  . folic acid  1 mg Oral Daily  . gabapentin  300 mg Oral TID  . multivitamin with minerals  1 tablet Oral Daily  . pantoprazole  40 mg Oral BID  . sildenafil  20 mg Oral TID  . spironolactone  12.5 mg Oral Daily  . thiamine  100 mg Oral Daily     Infusions: . lactated ringers 40 mL/hr at 03/16/20 0032     PRN Medications:  acetaminophen, albuterol, HYDROmorphone (DILAUDID) injection,  hydrOXYzine, LORazepam **OR** LORazepam, LORazepam **OR** LORazepam, ondansetron (ZOFRAN) IV, oxyCODONE   Assessment/Plan:    1. Acute pancreatitis: Suspect due to ETOH abuse.  CT abdomen suggestive of pancreatitis, no evidence for necrosis.  He admits to drinking.  No gallstones seen. Hydrated with NS 100 cc/hr x 1 L overnight, now on LR 40 cc/hr.  HCT is lower with hydration. Still with significant abdominal pain.  - Keep NPO except for sips with meds.  - Continue LR and hold diuretic.  - Dilaudid 1 mg q3 hrs.  2. ETOH abuse: He understands that he needs to quit.  Prior withdrawal symptoms.  - CIWA protocol.  - Thiamine/folate.  3. Chronic systolic HF with prominent RV failure. EF 10% s/p HM-3 LVAD on 09/06/17.  Admitted 8/21 for cardiogenic shock with severe RV failure. Treated with milrinone and then weaned off. Diuresed 40 pounds. VAD speed increased, currently at 6500 rpm.  He is not significantly volume overloaded on exam, NYHA class II. MAP elevated likely due to pain.  LVAD parameters stable.  - Hold diuretic with pancreatitis and gentle hydration as above. -  With elevated MAP, will need to control pain and continue home spironolactone.  - Continue sildenafil.  - INR goal 2-3 for LVAD, continue warfarin (INR therapeutic).  4. RecurrentDL infection with subxiphoid abscess: s/p debridement and pectoral flap in 4/21.  Had recurrent superficial subxiphoid abscess. CT with posible fluid collection. Evaluated by TCTS And IR and found to be muscle flap with no drainable fluid collection 6/21  - Continue suppressive Keflex.  - He is not a candidate for pump exchange given severe RV dysfunction and poor self-care 5. Depression: Continue meds.  6. HTN: BP elevated today but in pain.  - Control pain.   - Continue home spironolactone.  7. OSA: Sleep study with very severe OSA (AHI 69/hr).  Continues to refuse therapy. We have arranged f/u in sleep clinic several times but he has not been  compliant 8. H/o GI bleed: 08/2018 EGD completed and showed gastritis and nonbleeding duodenal ulcers, colonoscopy with 5 polyps removed. Protonix was increased to BID. No recent GI bleeding.  - Off ASA, continue warfarin.   9. Tobacco Abuse: Continues to smoke 2-3 cigs/day. - reinforced need to quit  I reviewed the LVAD parameters from today, and compared the results to the patient's prior recorded data.  No programming changes were made.  The LVAD is functioning within specified parameters.  The patient performs LVAD self-test daily.  LVAD interrogation was negative for any significant power changes, alarms or PI events/speed drops.  LVAD equipment check completed and is in good working order.  Back-up equipment present.   LVAD education done on emergency procedures and precautions and reviewed exit site care.  Length of Stay: 1  Marca Ancona, MD 03/16/2020, 8:51 AM  VAD Team --- VAD ISSUES ONLY--- Pager [phone] (7am - 7am)  Advanced Heart Failure Team  Pager [phone] (M-F; 7a - 4p)  Please contact [facility] for night-coverage after hours (4p -7a ) and weekends on [email or web address]

## 2020-03-17 DIAGNOSIS — K859 Acute pancreatitis without necrosis or infection, unspecified: Secondary | ICD-10-CM | POA: Diagnosis not present

## 2020-03-17 DIAGNOSIS — I5022 Chronic systolic (congestive) heart failure: Secondary | ICD-10-CM | POA: Diagnosis not present

## 2020-03-17 DIAGNOSIS — I509 Heart failure, unspecified: Secondary | ICD-10-CM

## 2020-03-17 DIAGNOSIS — Z95811 Presence of heart assist device: Secondary | ICD-10-CM | POA: Diagnosis not present

## 2020-03-17 DIAGNOSIS — K8500 Idiopathic acute pancreatitis without necrosis or infection: Secondary | ICD-10-CM | POA: Diagnosis not present

## 2020-03-17 LAB — LACTATE DEHYDROGENASE: LDH: 198 U/L — ABNORMAL HIGH (ref 98–192)

## 2020-03-17 LAB — BASIC METABOLIC PANEL
Anion gap: 11 (ref 5–15)
BUN: 15 mg/dL (ref 6–20)
CO2: 22 mmol/L (ref 22–32)
Calcium: 8.7 mg/dL — ABNORMAL LOW (ref 8.9–10.3)
Chloride: 97 mmol/L — ABNORMAL LOW (ref 98–111)
Creatinine, Ser: 1.21 mg/dL (ref 0.61–1.24)
GFR, Estimated: >60 mL/min (ref >60)
Glucose, Bld: 91 mg/dL (ref 70–99)
Potassium: 3.4 mmol/L — ABNORMAL LOW (ref 3.5–5.1)
Sodium: 130 mmol/L — ABNORMAL LOW (ref 135–145)

## 2020-03-17 LAB — CBC
HCT: 40.6 % (ref 39.0–52.0)
Hemoglobin: 12.7 g/dL — ABNORMAL LOW (ref 13.0–17.0)
MCH: 26.7 pg (ref 26.0–34.0)
MCHC: 31.3 g/dL (ref 30.0–36.0)
MCV: 85.3 fL (ref 80.0–100.0)
Platelets: 167 10*3/uL (ref 150–400)
RBC: 4.76 MIL/uL (ref 4.22–5.81)
RDW: 18.8 % — ABNORMAL HIGH (ref 11.5–15.5)
WBC: 28.6 10*3/uL — ABNORMAL HIGH (ref 4.0–10.5)
nRBC: 0 % (ref 0.0–0.2)

## 2020-03-17 LAB — PROTIME-INR
INR: 2.8 — ABNORMAL HIGH (ref 0.8–1.2)
Prothrombin Time: 28.7 seconds — ABNORMAL HIGH (ref 11.4–15.2)

## 2020-03-17 LAB — LIPASE, BLOOD: Lipase: 51 U/L (ref 11–51)

## 2020-03-17 LAB — GLUCOSE, CAPILLARY: Glucose-Capillary: 82 mg/dL (ref 70–99)

## 2020-03-17 MED ORDER — POTASSIUM CHLORIDE CRYS ER 20 MEQ PO TBCR
30.0000 meq | EXTENDED_RELEASE_TABLET | ORAL | Status: AC
  Administered 2020-03-17 (×2): 30 meq via ORAL
  Filled 2020-03-17 (×3): qty 1

## 2020-03-17 MED ORDER — WARFARIN SODIUM 5 MG PO TABS
5.0000 mg | ORAL_TABLET | Freq: Once | ORAL | Status: AC
  Administered 2020-03-17: 5 mg via ORAL
  Filled 2020-03-17: qty 1

## 2020-03-17 MED ORDER — HYDROMORPHONE HCL 1 MG/ML IJ SOLN
1.0000 mg | INTRAMUSCULAR | Status: DC | PRN
  Administered 2020-03-17 – 2020-03-18 (×6): 1 mg via INTRAVENOUS
  Filled 2020-03-17 (×6): qty 1

## 2020-03-17 MED ORDER — SODIUM CHLORIDE 0.9 % IV SOLN: INTRAVENOUS | Status: AC

## 2020-03-17 NOTE — Progress Notes (Signed)
Met with pt for SA consult. Pt accepts resources but is not interested in counseling or other clinical resources. Pt reports he feels like he can stop on his own and that his current medical complications due to his alcohol use are motivation enough. Pt does express some interest in someone to call for cravings and AA resources. CSW provides 24-hour AA hotline number and AA meeting scheduled for [facility] area.
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
## 2020-03-17 NOTE — Progress Notes (Addendum)
Patient ID: Jonathan Wood, male   DOB: February 06, 1971, 49 y.o.   MRN: [id number]   Advanced Heart Failure VAD Team Note  PCP-Cardiologist: Arvilla Meres, MD   Subjective:    Complaining of abdominal tenderness. Says pain is getting better. Denies SOB.   WBC trending up 20>23>28   LVAD INTERROGATION:  HeartMate 3 LVAD:   Flow 6.5 liters/min, speed 6500, power 6, PI 2.5     Objective:    Vital Signs:   Temp:  [98.1 F (36.7 C)-99.2 F (37.3 C)] 98.1 F (36.7 C) (11/01 0758) Pulse Rate:  [90-134] 90 (11/01 0758) Resp:  [14-29] 20 (11/01 0800) BP: (95-123)/(64-95) 107/95 (11/01 0800) SpO2:  [88 %-98 %] 96 % (11/01 0758) Weight:  [88.4 kg] 88.4 kg (11/01 0318)   Mean arterial Pressure 90s  Intake/Output:   Intake/Output Summary (Last 24 hours) at 03/17/2020 0919 Last data filed at 03/17/2020 0500 Gross per 24 hour  Intake 1250 ml  Output 120 ml  Net 1130 ml     Physical Exam    Physical Exam: GENERAL: No acute distress. HEENT: normal  NECK: Supple, JVP 6-7  .  2+ bilaterally, no bruits.  No lymphadenopathy or thyromegaly appreciated.   CARDIAC:  Mechanical heart sounds with LVAD hum present.  LUNGS:  Clear to auscultation bilaterally.  ABDOMEN:  Soft, round, tender, positive bowel sounds x4.     LVAD exit site:   Dressing dry and intact.  No erythema or drainage.  Stabilization device present and accurately applied.  Driveline dressing is being changed daily per sterile technique. EXTREMITIES:  Warm and dry, no cyanosis, clubbing, rash or edema  NEUROLOGIC:  Alert and oriented x 3.    No aphasia.  No dysarthria.  Affect pleasant.      Telemetry   NSR 90-100s   Labs   Basic Metabolic Panel: Recent Labs  Lab 03/12/20 1005 03/12/20 1005 03/15/20 1128 03/15/20 1538 03/17/20 0405  NA 138  --  132* 132* 130*  K 3.7  --  3.2* 3.3* 3.4*  CL 105  --  98 98 97*  CO2 20*  --  20* 21* 22  GLUCOSE 120*  --  126* 91 91  BUN 9  --  12 12 15   CREATININE 0.98   --  1.15 1.07 1.21  CALCIUM 8.6*   < > 9.1 8.9 8.7*  MG  --   --   --  1.8  --   PHOS  --   --   --  3.3  --    < > = values in this interval not displayed.    Liver Function Tests: Recent Labs  Lab 03/12/20 1005 03/15/20 1128 03/15/20 1538  AST 29 20 20   ALT 13 12 13   ALKPHOS 118 138* 134*  BILITOT 0.9 2.3* 1.7*  PROT 7.3 9.1* 9.0*  ALBUMIN 3.3* 3.9 3.9   Recent Labs  Lab 03/15/20 1128 03/17/20 0405  LIPASE 367* 51   No results for input(s): AMMONIA in the last 168 hours.  CBC: Recent Labs  Lab 03/12/20 1005 03/15/20 1128 03/15/20 1538 03/16/20 0123 03/17/20 0405  WBC 6.2 21.0* 20.8* 23.2* 28.6*  HGB 12.3* 15.0 15.1 13.6 12.7*  HCT 40.2 47.1 48.3 42.6 40.6  MCV 86.5 83.4 85.5 84.4 85.3  PLT 173 203 207 182 167    INR: Recent Labs  Lab 03/12/20 1005 03/15/20 1302 03/15/20 1538 03/16/20 0123 03/17/20 0405  INR 2.8* 2.4* 2.2* 2.5* 2.8*    Other  results:  EKG:    Imaging   CT Abdomen Pelvis W Contrast  Result Date: 03/15/2020 CLINICAL DATA:  Left lower quadrant abdominal pain for 1 day. EXAM: CT ABDOMEN AND PELVIS WITH CONTRAST TECHNIQUE: Multidetector CT imaging of the abdomen and pelvis was performed using the standard protocol following bolus administration of intravenous contrast. CONTRAST:  OMNIPAQUE IOHEXOL 300 MG/ML  SOLN COMPARISON:  CT abdomen pelvis dated 12/27/2019 and 11/14/2019. FINDINGS: Lower chest: There is mild bibasilar atelectasis. A left ventricular assist device is partially imaged and results in streak artifact which obscures the upper abdomen/lower thorax. Hepatobiliary: No focal liver abnormality is seen. No gallstones, gallbladder wall thickening, or biliary dilatation. Pancreas: The pancreas is edematous with surrounding edema and a small amount of fluid in the left retro colic gutter. There is fatty replacement of the pancreas, however the visible parenchyma appears to be normally enhancing and there is no definite evidence  of pancreatic necrosis. There is no acute peripancreatic fluid collection. The pancreatic duct does not appear dilated. Spleen: Normal in size without focal abnormality. Adrenals/Urinary Tract: Adrenal glands are unremarkable. Other than left renal cysts which measure up to 2.2 cm, the kidneys are normal, without renal calculi, focal lesion, or hydronephrosis. Bladder is unremarkable. Stomach/Bowel: Stomach is within normal limits. No pericecal inflammatory changes are noted to suggest acute appendicitis. There is mild bowel wall thickening of the proximal portion of the descending colon which is likely reactive given the adjacent inflammatory changes from the patient's pancreatitis. No evidence of bowel obstruction. Vascular/Lymphatic: No significant vascular findings are present. No portal vein thrombosis or splenic artery aneurysm is identified. No enlarged abdominal or pelvic lymph nodes. Reproductive: Prostate is unremarkable. Other: No abdominal wall hernia or abnormality. Musculoskeletal: No acute or significant osseous findings. IMPRESSION: 1. Acute interstitial edematous pancreatitis. No evidence of pancreatic necrosis or acute peripancreatic fluid collection. Electronically Signed   By: Romona Curls M.D.   On: 03/15/2020 13:09   DG Chest Port 1 View  Result Date: 03/15/2020 CLINICAL DATA:  LVAD.  Generalized abdominal pain. EXAM: PORTABLE CHEST 1 VIEW COMPARISON:  12/27/2019 FINDINGS: LVAD in place, unchanged. Prior median sternotomy. Cardiomegaly. No confluent opacities or effusions. Lingular scarring. IMPRESSION: Cardiomegaly.  No active disease. Electronically Signed   By: Charlett Nose M.D.   On: 03/15/2020 17:05     Medications:     Scheduled Medications: . cephALEXin  500 mg Oral TID  . docusate sodium  100 mg Oral BID  . DULoxetine  60 mg Oral Daily  . folic acid  1 mg Oral Daily  . gabapentin  300 mg Oral TID  . multivitamin with minerals  1 tablet Oral Daily  . pantoprazole  40  mg Oral BID  . sildenafil  20 mg Oral TID  . spironolactone  12.5 mg Oral Daily  . thiamine  100 mg Oral Daily  . Warfarin - Pharmacist Dosing Inpatient   Does not apply q1600    Infusions:   PRN Medications: acetaminophen, albuterol, HYDROmorphone (DILAUDID) injection, hydrOXYzine, LORazepam **OR** LORazepam, LORazepam **OR** LORazepam, ondansetron (ZOFRAN) IV, oxyCODONE   Assessment/Plan:    1. Acute pancreatitis: Suspect due to ETOH abuse.  CT abdomen suggestive of pancreatitis, no evidence for necrosis.  He admits to drinking.  No gallstones seen. Hydrated with NS.  - Pain seems to be better today. Should be able to stop dilaudid.  - Continue oxycodone.  2. ETOH abuse: He understands that he needs to quit.  Prior withdrawal symptoms.  -  CIWA protocol.  - Thiamine/folate.  3. Chronic systolic HF with prominent RV failure. EF 10% s/p HM-3 LVAD on 09/06/17.  Admitted 8/21 for cardiogenic shock with severe RV failure. Treated with milrinone and then weaned off. Diuresed 40 pounds. VAD speed increased, currently at 6500 rpm.  He is not significantly volume overloaded on exam, NYHA class II.  -   LVAD parameters stable. LDH stable.  - Hold diuretic with pancreatitis. Supp K -  Continue sildenafil.  - INR goal 2-2.5. INR 2.8   4. RecurrentDL infection with subxiphoid abscess: s/p debridement and pectoral flap in 4/21.  Had recurrent superficial subxiphoid abscess. CT with posible fluid collection. Evaluated by TCTS And IR and found to be muscle flap with no drainable fluid collection 6/21  - WBC trending up 20>23>28.  - Continue suppressive Keflex.  - He is not a candidate for pump exchange given severe RV dysfunction and poor self-care 5. Depression: Continue meds.  6. HTN: Stable  - Control pain.   - Continue home spironolactone.  7. OSA: Sleep study with very severe OSA (AHI 69/hr).  Continues to refuse therapy. We have arranged f/u in sleep clinic several times but he has not  been compliant 8. H/o GI bleed: 08/2018 EGD completed and showed gastritis and nonbleeding duodenal ulcers, colonoscopy with 5 polyps removed. Protonix was increased to BID. No recent GI bleeding.  - Off ASA, continue warfarin.   9. Tobacco Abuse: Continues to smoke 2-3 cigs/day. - reinforced need to quit  I reviewed the LVAD parameters from today, and compared the results to the patient's prior recorded data.  No programming changes were made.  The LVAD is functioning within specified parameters.  The patient performs LVAD self-test daily.  LVAD interrogation was negative for any significant power changes, alarms or PI events/speed drops.  LVAD equipment check completed and is in good working order.  Back-up equipment present.   LVAD education done on emergency procedures and precautions and reviewed exit site care.  Length of Stay: 2  Tonye Becket, NP 03/17/2020, 9:19 AM  VAD Team --- VAD ISSUES ONLY--- Pager [phone] (7am - 7am)  Advanced Heart Failure Team  Pager [phone] (M-F; 7a - 4p)  Please contact [facility] for night-coverage after hours (4p -7a ) and weekends on [email or web address]  Patient seen and examined with the above-signed Advanced Practice Provider and/or Housestaff. I personally reviewed laboratory data, imaging studies and relevant notes. I independently examined the patient and formulated the important aspects of the plan. I have edited the note to reflect any of my changes or salient points. I have personally discussed the plan with the patient and/or family.  Abdominal pain improving this am. Able to tolerate clear liquid diet. However WBC up to 28k. Tmax 99. MAPs stable. Urine dark. HF meds on hold  General: Lying in bed  NAD.  HEENT: normal  Neck: supple. JVP not elevated.  Carotids 2+ bilat; no bruits. No lymphadenopathy or thryomegaly appreciated. Cor: LVAD hum.  Lungs: Clear. Abdomen: obese soft, minimally tender, non-distended. No hepatosplenomegaly. No bruits or  masses. Good bowel sounds. Driveline site clean. Anchor in place.  Extremities: no cyanosis, clubbing, rash. Warm no edema  Neuro: alert & oriented x 3. No focal deficits. Moves all 4 without problem   WBC continues to climb but clinically pancreatitis improving. I reviewed labs and imaging and no evidence of biliary involvement. Suspect ETOH. D/w GI who agreed with further supportive care. If WBC continue to climb or becomes more  symptomatic can re-image. Will give another 1L IVF. HF otherwise stable. VAD interrogated personally. Parameters stable. INR ok. Discussed dosing with PharmD personally.  Arvilla Meres, MD  2:09 PM

## 2020-03-17 NOTE — Progress Notes (Signed)
ANTICOAGULATION CONSULT NOTE   Pharmacy Consult for warfarin Indication: LVAD  No Known Allergies  Patient Measurements: Height: 6\' 2"  (188 cm) Weight: 88.4 kg (194 lb 14.2 oz) IBW/kg (Calculated) : 82.2  Vital Signs: Temp: 97.8 F (36.6 C) (11/01 1046) Temp Source: Oral (11/01 1046) BP: 108/70 (11/01 1046) Pulse Rate: 92 (11/01 1338)  Labs: Recent Labs    03/15/20 1128 03/15/20 1302 03/15/20 1538 03/15/20 1538 03/16/20 0123 03/17/20 0405  HGB 15.0   < > 15.1   < > 13.6 12.7*  HCT 47.1   < > 48.3  --  42.6 40.6  PLT 203   < > 207  --  182 167  LABPROT  --    < > 24.0*  --  25.9* 28.7*  INR  --    < > 2.2*  --  2.5* 2.8*  CREATININE 1.15  --  1.07  --   --  1.21   < > = values in this interval not displayed.    Estimated Creatinine Clearance: 87.7 mL/min (by C-G formula based on SCr of 1.21 mg/dL).   Medical History: Past Medical History:  Diagnosis Date  . Asthma   . CHF (congestive heart failure) (HCC)    a. 09/2016: EF 20-25% with cath showing normal cors  . GERD (gastroesophageal reflux disease)   . History of hiatal hernia   . LVAD (left ventricular assist device) present (HCC)   . OSA on CPAP 09/06/2018   Severe OSA with AHI 68/hr on CPAP at 12cm H2O    Assessment: 49 YOM presenting with abdominal pain, LVAD pt on warfarin PTA with last dose taken today 10/30, INR 2.2 on admission.  CBC wnl.    INR up to 2.8 today, therapeutic. H/H, plt drop due to fluids. No reported bleeding.   Pt rerpoted PTA dosing: 7.5mg  MWF, 5mg  all other days but 03/12/20 Anticoag visit dose is 5 mg MWF, 2.5mg  TTSS  Goal of Therapy:  INR 2.0-2.5 per HF team Monitor platelets by anticoagulation protocol: Yes   Plan:  Warfarin 5 mg x1 per Anticoag clinic dose  Monitor daily INR, CBC/plt Monitor for signs/symptoms of bleeding   , [facility] Clinical Pharmacist  03/17/2020 1:57 PM   [facility] pharmacy phone numbers are listed on [email or web address]

## 2020-03-17 NOTE — Progress Notes (Addendum)
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  Pain is described as dull.  Onset 03/13/2020 while watching football after drinking approximately 1 pint of alcohol.  Denies any previous symptoms previously.  In the ED, patient afebrile.  Lipase elevated 367.  CT abdomen/pelvis with acute interstitial edematous pancreatitis without evidence of pancreatic necrosis or peripancreatic fluid collection.  Admitted by heart failure service.  TRH consulted for assistance with management of acute pancreatitis.   Assessment & Plan:   Principal Problem:   Acute pancreatitis Active Problems:   LVAD (left ventricular assist device) present (HCC)   Alcohol use disorder, moderate, in early remission (HCC)   Acute alcoholic pancreatitis Patient presenting to the ED with acute onset abdominal pain radiating towards his back after drinking approximately 1 pint of alcohol.  Lipase elevated at 367 with CT findings of acute interstitial edematous pancreatitis without necrosis or peripancreatic fluid collection. --CLD, currently tolerating w/o nausea or vomiting, advance to full liquid diet today --oxycodone 5 mg q4h prn moderate pain  --Discontinue Dilaudid --Discontinued IV fentanyl, patient states ineffective --Antiemetics as needed --Discussed need for complete alcohol cessation  EtOH use disorder Patient with continued alcohol use, 1 pint reported on 03/13/2020.  Endorses previous tremors following alcohol cessation, but denies any history of seizures from alcohol  withdrawal. --CIWAA protocol with symptom triggered Ativan --Folic acid, thiamine, multivitamin --Continue to encourage EtOH cessation  Tobacco use disorder Continues to endorse 2-3 cigarettes/day.  Counseled on need for complete cessation.  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  Treated with Ancef and p.o. rifabutin, continues on chronic Keflex suppression therapy. --Further per cardiology  Leukocytosis History of recurrent driveline infection as above.  Unclear etiology, afebrile. --WBC 20>23>28 (6.2 on 10/27) --On suppressive Keflex as above --Continue to monitor fever curve, obtain CBC with differential in a.m.  Appreciate the opportunity to participate in the care of this most interesting patient.  We will continue to follow along with you.  DVT prophylaxis: SCDs Code Status:   Code Status: Partial Code Family Communication: None present at bedside  Disposition Plan: Per primary cardiology   Procedures:   None  Antimicrobials:   On Keflex for chronic suppression   Subjective: Patient seen and examined at bedside, resting comfortably.  Tolerating clear liquid diet.  Wishes for further advancement today.  Discussed with him needs to utilize oral pain medications and will discontinue IV pain medications today.  Continue to discuss need for complete alcohol cessation as well as tobacco.  No other complaints or concerns at this time.  Denies headache, no fever/chills/night sweats, no nausea/vomiting/diarrhea, no chest pain, no palpitations, no shortness of breath, no weakness, no fatigue.  No acute events overnight per nursing staff.  Objective: Vitals:   03/17/20 0608 03/17/20 0645 03/17/20 0758 03/17/20 0800    BP:   (!) 107/95 (!) 107/95  Pulse: 99 (!) 111 90   Resp: (!) 24 19  16 20  Temp:   98.1 F (36.7 C)   TempSrc:   Oral   SpO2: (!) 88% 96% 96%   Weight:      Height:        Intake/Output Summary (Last 24 hours) at 03/17/2020 0956 Last data filed at 03/17/2020 0500 Gross per 24 hour  Intake 1250 ml  Output 120 ml  Net 1130 ml   Filed Weights   03/16/20 0117 03/17/20 0318  Weight: 88.9 kg 88.4 kg    Examination:  General exam: Appears calm and comfortable  Respiratory system: Clear to auscultation. Respiratory effort normal. Cardiovascular system: LVAD hum appreciated, no peripheral edema, LVAD noted in place Gastrointestinal system: Abdomen is nondistended, soft, mild tenderness to palpation epigastrium. No organomegaly or masses felt. Normal bowel sounds heard. Central nervous system: Alert and oriented. No focal neurological deficits. Extremities: Symmetric 5 x 5 power. Skin: No rashes, lesions or ulcers Psychiatry: Judgement and insight appear normal. Mood & affect appropriate.     Data Reviewed: I have personally reviewed following labs and imaging studies  CBC: Recent Labs  Lab 03/12/20 1005 03/15/20 1128 03/15/20 1538 03/16/20 0123 03/17/20 0405  WBC 6.2 21.0* 20.8* 23.2* 28.6*  HGB 12.3* 15.0 15.1 13.6 12.7*  HCT 40.2 47.1 48.3 42.6 40.6  MCV 86.5 83.4 85.5 84.4 85.3  PLT 173 203 207 182 167   Basic Metabolic Panel: Recent Labs  Lab 03/12/20 1005 03/15/20 1128 03/15/20 1538 03/17/20 0405  NA 138 132* 132* 130*  K 3.7 3.2* 3.3* 3.4*  CL 105 98 98 97*  CO2 20* 20* 21* 22  GLUCOSE 120* 126* 91 91  BUN 9 12 12 15   CREATININE 0.98 1.15 1.07 1.21  CALCIUM 8.6* 9.1 8.9 8.7*  MG  --   --  1.8  --   PHOS  --   --  3.3  --    GFR: Estimated Creatinine Clearance: 87.7 mL/min (by C-G formula based on SCr of 1.21 mg/dL). Liver Function Tests: Recent Labs  Lab 03/12/20 1005 03/15/20 1128 03/15/20 1538  AST 29 20 20   ALT 13 12 13   ALKPHOS 118 138*  134*  BILITOT 0.9 2.3* 1.7*  PROT 7.3 9.1* 9.0*  ALBUMIN 3.3* 3.9 3.9   Recent Labs  Lab 03/15/20 1128 03/17/20 0405  LIPASE 367* 51   No results for input(s): AMMONIA in the last 168 hours. Coagulation Profile: Recent Labs  Lab 03/12/20 1005 03/15/20 1302 03/15/20 1538 03/16/20 0123 03/17/20 0405  INR 2.8* 2.4* 2.2* 2.5* 2.8*   Cardiac Enzymes: No results for input(s): CKTOTAL, CKMB, CKMBINDEX, TROPONINI in the last 168 hours. BNP (last 3 results) No results for input(s): PROBNP in the last 8760 hours. HbA1C: No results for input(s): HGBA1C in the last 72 hours. CBG: No results for input(s): GLUCAP in the last 168 hours. Lipid Profile: No results for input(s): CHOL, HDL, LDLCALC, TRIG, CHOLHDL, LDLDIRECT in the last 72 hours. Thyroid Function Tests: No results for input(s): TSH, T4TOTAL, FREET4, T3FREE, THYROIDAB in the last 72 hours. Anemia Panel: No results for input(s): VITAMINB12, FOLATE, FERRITIN, TIBC, IRON, RETICCTPCT in the last 72 hours. Sepsis Labs: No results for input(s): PROCALCITON, LATICACIDVEN in the last 168 hours.  Recent Results (from the past 240 hour(s))  Respiratory Panel by RT PCR (Flu A&B, Covid) - Nasopharyngeal Swab     Status: None   Collection Time: 03/15/20  4:26 PM   Specimen: Nasopharyngeal Swab  Result Value Ref Range Status   SARS Coronavirus  2 by RT PCR NEGATIVE NEGATIVE Final    Comment: (NOTE) SARS-CoV-2 target nucleic acids are NOT DETECTED.  The SARS-CoV-2 RNA is generally detectable in upper respiratoy specimens during the acute phase of infection. The lowest concentration of SARS-CoV-2 viral copies this assay can detect is 131 copies/mL. A negative result does not preclude SARS-Cov-2 infection and should not be used as the sole basis for treatment or other patient management decisions. A negative result may occur with  improper specimen collection/handling, submission of specimen other than nasopharyngeal swab, presence  of viral mutation(s) within the areas targeted by this assay, and inadequate number of viral copies (<131 copies/mL). A negative result must be combined with clinical observations, patient history, and epidemiological information. The expected result is Negative.  Fact Sheet for Patients:  https://www.moore.com/  Fact Sheet for Healthcare Providers:  https://www.young.biz/  This test is no t yet approved or cleared by the [place] FDA and  has been authorized for detection and/or diagnosis of SARS-CoV-2 by FDA under an Emergency Use Authorization (EUA). This EUA will remain  in effect (meaning this test can be used) for the duration of the COVID-19 declaration under Section 564(b)(1) of the Act, 21 U.S.C. section 360bbb-3(b)(1), unless the authorization is terminated or revoked sooner.     Influenza A by PCR NEGATIVE NEGATIVE Final   Influenza B by PCR NEGATIVE NEGATIVE Final    Comment: (NOTE) The Xpert Xpress SARS-CoV-2/FLU/RSV assay is intended as an aid in  the diagnosis of influenza from Nasopharyngeal swab specimens and  should not be used as a sole basis for treatment. Nasal washings and  aspirates are unacceptable for Xpert Xpress SARS-CoV-2/FLU/RSV  testing.  Fact Sheet for Patients: https://www.moore.com/  Fact Sheet for Healthcare Providers: https://www.young.biz/  This test is not yet approved or cleared by the [place] FDA and  has been authorized for detection and/or diagnosis of SARS-CoV-2 by  FDA under an Emergency Use Authorization (EUA). This EUA will remain  in effect (meaning this test can be used) for the duration of the  Covid-19 declaration under Section 564(b)(1) of the Act, 21  U.S.C. section 360bbb-3(b)(1), unless the authorization is  terminated or revoked. Performed at [facility], [address], [place], [place] [address]   MRSA PCR Screening      Status: None   Collection Time: 03/16/20  4:28 AM   Specimen: Nasal Mucosa; Nasopharyngeal  Result Value Ref Range Status   MRSA by PCR NEGATIVE NEGATIVE Final    Comment:        The GeneXpert MRSA Assay (FDA approved for NASAL specimens only), is one component of a comprehensive MRSA colonization surveillance program. It is not intended to diagnose MRSA infection nor to guide or monitor treatment for MRSA infections. Performed at [facility], [address] [place], [place] [address]          Radiology Studies: CT Abdomen Pelvis W Contrast  Result Date: 03/15/2020 CLINICAL DATA:  Left lower quadrant abdominal pain for 1 day. EXAM: CT ABDOMEN AND PELVIS WITH CONTRAST TECHNIQUE: Multidetector CT imaging of the abdomen and pelvis was performed using the standard protocol following bolus administration of intravenous contrast. CONTRAST:  OMNIPAQUE IOHEXOL 300 MG/ML  SOLN COMPARISON:  CT abdomen pelvis dated 12/27/2019 and 11/14/2019. FINDINGS: Lower chest: There is mild bibasilar atelectasis. A left ventricular assist device is partially imaged and results in streak artifact which obscures the upper abdomen/lower thorax. Hepatobiliary: No focal liver abnormality is seen. No  gallstones, gallbladder wall thickening, or biliary dilatation. Pancreas: The pancreas is edematous with surrounding edema and a small amount of fluid in the left retro colic gutter. There is fatty replacement of the pancreas, however the visible parenchyma appears to be normally enhancing and there is no definite evidence of pancreatic necrosis. There is no acute peripancreatic fluid collection. The pancreatic duct does not appear dilated. Spleen: Normal in size without focal abnormality. Adrenals/Urinary Tract: Adrenal glands are unremarkable. Other than left renal cysts which measure up to 2.2 cm, the kidneys are normal, without renal calculi, focal lesion, or hydronephrosis. Bladder is unremarkable.  Stomach/Bowel: Stomach is within normal limits. No pericecal inflammatory changes are noted to suggest acute appendicitis. There is mild bowel wall thickening of the proximal portion of the descending colon which is likely reactive given the adjacent inflammatory changes from the patient's pancreatitis. No evidence of bowel obstruction. Vascular/Lymphatic: No significant vascular findings are present. No portal vein thrombosis or splenic artery aneurysm is identified. No enlarged abdominal or pelvic lymph nodes. Reproductive: Prostate is unremarkable. Other: No abdominal wall hernia or abnormality. Musculoskeletal: No acute or significant osseous findings. IMPRESSION: 1. Acute interstitial edematous pancreatitis. No evidence of pancreatic necrosis or acute peripancreatic fluid collection. Electronically Signed   By: Romona Curls M.D.   On: 03/15/2020 13:09   DG Chest Port 1 View  Result Date: 03/15/2020 CLINICAL DATA:  LVAD.  Generalized abdominal pain. EXAM: PORTABLE CHEST 1 VIEW COMPARISON:  12/27/2019 FINDINGS: LVAD in place, unchanged. Prior median sternotomy. Cardiomegaly. No confluent opacities or effusions. Lingular scarring. IMPRESSION: Cardiomegaly.  No active disease. Electronically Signed   By: Charlett Nose M.D.   On: 03/15/2020 17:05        Scheduled Meds: . cephALEXin  500 mg Oral TID  . docusate sodium  100 mg Oral BID  . DULoxetine  60 mg Oral Daily  . folic acid  1 mg Oral Daily  . gabapentin  300 mg Oral TID  . multivitamin with minerals  1 tablet Oral Daily  . pantoprazole  40 mg Oral BID  . potassium chloride  30 mEq Oral Q4H  . sildenafil  20 mg Oral TID  . spironolactone  12.5 mg Oral Daily  . thiamine  100 mg Oral Daily  . warfarin  5 mg Oral ONCE-1600  . Warfarin - Pharmacist Dosing Inpatient   Does not apply q1600   Continuous Infusions: . sodium chloride       LOS: 2 days    Time spent: 35 minutes spent on chart review, discussion with nursing staff,  consultants, updating family and interview/physical exam; more than 50% of that time was spent in counseling and/or coordination of care.    Alvira Philips [place], DO [facility] Available via Epic secure chat 7am-7pm After these hours, please refer to coverage provider listed on [email or web address] 03/17/2020, 9:56 AM

## 2020-03-17 NOTE — Plan of Care (Signed)
  Problem: Education: Goal: Knowledge of General Education information will improve Description: Including pain rating scale, medication(s)/side effects and non-pharmacologic comfort measures Outcome: Progressing   Problem: Health Behavior/Discharge Planning: Goal: Ability to manage health-related needs will improve Outcome: Progressing   Problem: Clinical Measurements: Goal: Ability to maintain clinical measurements within normal limits will improve Outcome: Progressing Goal: Will remain free from infection Outcome: Progressing Goal: Diagnostic test results will improve Outcome: Progressing Goal: Respiratory complications will improve Outcome: Progressing Goal: Cardiovascular complication will be avoided Outcome: Progressing   Problem: Activity: Goal: Risk for activity intolerance will decrease Outcome: Progressing   Problem: Nutrition: Goal: Adequate nutrition will be maintained Outcome: Progressing   Problem: Coping: Goal: Level of anxiety will decrease Outcome: Progressing   Problem: Elimination: Goal: Will not experience complications related to bowel motility Outcome: Progressing Goal: Will not experience complications related to urinary retention Outcome: Progressing   Problem: Pain Managment: Goal: General experience of comfort will improve Outcome: Progressing   Problem: Safety: Goal: Ability to remain free from injury will improve Outcome: Progressing   Problem: Skin Integrity: Goal: Risk for impaired skin integrity will decrease Outcome: Progressing   Problem: Education: Goal: Knowledge of cardiac device and self-care will improve Outcome: Progressing Goal: Ability to safely manage health related needs after discharge will improve Outcome: Progressing Goal: Individualized Educational Video(s) Outcome: Progressing

## 2020-03-18 LAB — PROCALCITONIN: Procalcitonin: 2.3 ng/mL

## 2020-03-18 LAB — CBC WITH DIFFERENTIAL/PLATELET
Abs Immature Granulocytes: 0.17 10*3/uL — ABNORMAL HIGH (ref 0.00–0.07)
Basophils Absolute: 0 10*3/uL (ref 0.0–0.1)
Basophils Relative: 0 %
Eosinophils Absolute: 0.3 10*3/uL (ref 0.0–0.5)
Eosinophils Relative: 2 %
HCT: 36.1 % — ABNORMAL LOW (ref 39.0–52.0)
Hemoglobin: 11.4 g/dL — ABNORMAL LOW (ref 13.0–17.0)
Immature Granulocytes: 1 %
Lymphocytes Relative: 7 %
Lymphs Abs: 1.3 10*3/uL (ref 0.7–4.0)
MCH: 26.9 pg (ref 26.0–34.0)
MCHC: 31.6 g/dL (ref 30.0–36.0)
MCV: 85.1 fL (ref 80.0–100.0)
Monocytes Absolute: 2.3 10*3/uL — ABNORMAL HIGH (ref 0.1–1.0)
Monocytes Relative: 13 %
Neutro Abs: 13.5 10*3/uL — ABNORMAL HIGH (ref 1.7–7.7)
Neutrophils Relative %: 77 %
Platelets: 176 10*3/uL (ref 150–400)
RBC: 4.24 MIL/uL (ref 4.22–5.81)
RDW: 18.4 % — ABNORMAL HIGH (ref 11.5–15.5)
WBC: 17.5 10*3/uL — ABNORMAL HIGH (ref 4.0–10.5)
nRBC: 0 % (ref 0.0–0.2)

## 2020-03-18 LAB — BASIC METABOLIC PANEL
Anion gap: 10 (ref 5–15)
BUN: 10 mg/dL (ref 6–20)
CO2: 22 mmol/L (ref 22–32)
Calcium: 8.6 mg/dL — ABNORMAL LOW (ref 8.9–10.3)
Chloride: 100 mmol/L (ref 98–111)
Creatinine, Ser: 0.84 mg/dL (ref 0.61–1.24)
GFR, Estimated: >60 mL/min (ref >60)
Glucose, Bld: 72 mg/dL (ref 70–99)
Potassium: 3.7 mmol/L (ref 3.5–5.1)
Sodium: 132 mmol/L — ABNORMAL LOW (ref 135–145)

## 2020-03-18 LAB — LACTATE DEHYDROGENASE: LDH: 193 U/L — ABNORMAL HIGH (ref 98–192)

## 2020-03-18 LAB — PROTIME-INR
INR: 2.7 — ABNORMAL HIGH (ref 0.8–1.2)
Prothrombin Time: 27.9 seconds — ABNORMAL HIGH (ref 11.4–15.2)

## 2020-03-18 MED ORDER — WARFARIN SODIUM 2.5 MG PO TABS
2.5000 mg | ORAL_TABLET | Freq: Once | ORAL | Status: AC
  Administered 2020-03-18: 2.5 mg via ORAL
  Filled 2020-03-18: qty 1

## 2020-03-18 NOTE — Progress Notes (Addendum)
Patient ID: Jonathan Wood, male   DOB: 10-17-1970, 49 y.o.   MRN: [id number]   Advanced Heart Failure VAD Team Note  PCP-Cardiologist: Arvilla Meres, MD   Subjective:    Yesterday given IV fluids. IV dilaudid restarted yesterday. Received total of 5 mg of IV dilaudid.   Denies pain. Denies SOB. Denies nausea.   WBC trending up 20>23>28 >17.5  LVAD INTERROGATION:  HeartMate 3 LVAD:   Flow 6.3 liters/min, speed 6500, power 6, PI 2.9 Rare PI events   Objective:    Vital Signs:   Temp:  [97.8 F (36.6 C)-98.5 F (36.9 C)] 97.9 F (36.6 C) (11/02 0718) Pulse Rate:  [83-120] 83 (11/02 0718) Resp:  [15-20] 20 (11/02 0718) BP: (101-115)/(70-93) 106/93 (11/02 0718) SpO2:  [91 %-97 %] 94 % (11/02 0718) Weight:  [89.7 kg] 89.7 kg (11/02 0327)   Mean arterial Pressure 90s   Intake/Output:   Intake/Output Summary (Last 24 hours) at 03/18/2020 0945 Last data filed at 03/18/2020 0317 Gross per 24 hour  Intake 2534.17 ml  Output 375 ml  Net 2159.17 ml     Physical Exam   Physical Exam: GENERAL: No acute distress. HEENT: normal  NECK: Supple, JVP flat   2+ bilaterally, no bruits.  No lymphadenopathy or thyromegaly appreciated.   CARDIAC:  Mechanical heart sounds with LVAD hum present.  LUNGS:  Clear to auscultation bilaterally.  ABDOMEN:  Soft, round, nontender, positive bowel sounds x4.     LVAD exit site:  Dressing dry and intact.  No erythema or drainage.  Stabilization device present and accurately applied.  Driveline dressing is being changed daily per sterile technique. EXTREMITIES:  Warm and dry, no cyanosis, clubbing, rash or edema  NEUROLOGIC:  Alert and oriented x 3.    No aphasia.  No dysarthria.  Affect pleasant.         Telemetry   NSR 90s    Labs   Basic Metabolic Panel: Recent Labs  Lab 03/12/20 1005 03/12/20 1005 03/15/20 1128 03/15/20 1128 03/15/20 1538 03/17/20 0405 03/18/20 0044  NA 138  --  132*  --  132* 130* 132*  K 3.7  --  3.2*   --  3.3* 3.4* 3.7  CL 105  --  98  --  98 97* 100  CO2 20*  --  20*  --  21* 22 22  GLUCOSE 120*  --  126*  --  91 91 72  BUN 9  --  12  --  12 15 10   CREATININE 0.98  --  1.15  --  1.07 1.21 0.84  CALCIUM 8.6*   < > 9.1   < > 8.9 8.7* 8.6*  MG  --   --   --   --  1.8  --   --   PHOS  --   --   --   --  3.3  --   --    < > = values in this interval not displayed.    Liver Function Tests: Recent Labs  Lab 03/12/20 1005 03/15/20 1128 03/15/20 1538  AST 29 20 20   ALT 13 12 13   ALKPHOS 118 138* 134*  BILITOT 0.9 2.3* 1.7*  PROT 7.3 9.1* 9.0*  ALBUMIN 3.3* 3.9 3.9   Recent Labs  Lab 03/15/20 1128 03/17/20 0405  LIPASE 367* 51   No results for input(s): AMMONIA in the last 168 hours.  CBC: Recent Labs  Lab 03/15/20 1128 03/15/20 1538 03/16/20 0123 03/17/20 0405 03/18/20  0044  WBC 21.0* 20.8* 23.2* 28.6* 17.5*  NEUTROABS  --   --   --   --  13.5*  HGB 15.0 15.1 13.6 12.7* 11.4*  HCT 47.1 48.3 42.6 40.6 36.1*  MCV 83.4 85.5 84.4 85.3 85.1  PLT 203 207 182 167 176    INR: Recent Labs  Lab 03/15/20 1302 03/15/20 1538 03/16/20 0123 03/17/20 0405 03/18/20 0044  INR 2.4* 2.2* 2.5* 2.8* 2.7*    Other results:  EKG:    Imaging   No results found.   Medications:     Scheduled Medications: . cephALEXin  500 mg Oral TID  . docusate sodium  100 mg Oral BID  . DULoxetine  60 mg Oral Daily  . folic acid  1 mg Oral Daily  . gabapentin  300 mg Oral TID  . multivitamin with minerals  1 tablet Oral Daily  . pantoprazole  40 mg Oral BID  . sildenafil  20 mg Oral TID  . spironolactone  12.5 mg Oral Daily  . thiamine  100 mg Oral Daily  . warfarin  2.5 mg Oral ONCE-1600  . Warfarin - Pharmacist Dosing Inpatient   Does not apply q1600    Infusions: . sodium chloride 125 mL/hr at 03/17/20 1827    PRN Medications: acetaminophen, albuterol, HYDROmorphone (DILAUDID) injection, hydrOXYzine, LORazepam **OR** LORazepam, LORazepam **OR** LORazepam, ondansetron  (ZOFRAN) IV, oxyCODONE   Assessment/Plan:    1. Acute pancreatitis: Suspect due to ETOH abuse.  CT abdomen suggestive of pancreatitis, no evidence for necrosis.  He admits to drinking.  No gallstones seen. Hydrated with IV fluids. Now off. Tolerating full liquids.  - No pain today. Stop dilaudid.   - Continue oxycodone.  2. ETOH abuse: He understands that he needs to quit.  Prior withdrawal symptoms.  - CIWA protocol.  - Thiamine/folate.  3. Chronic systolic HF with prominent RV failure. EF 10% s/p HM-3 LVAD on 09/06/17.  Admitted 8/21 for cardiogenic shock with severe RV failure. Treated with milrinone and then weaned off. Diuresed 40 pounds. VAD speed increased, currently at 6500 rpm.  He is not significantly volume overloaded on exam, NYHA class II.  -   LVAD parameters stable. LDH stable. .  -Volume status stable.  Hold diuretic with pancreatitis.  -   Continue sildenafil.  - INR goal 2-2.5. INR 2.7  4. RecurrentDL infection with subxiphoid abscess: s/p debridement and pectoral flap in 4/21.  Had recurrent superficial subxiphoid abscess. CT with posible fluid collection. Evaluated by [facility]CTS And IR and found to be muscle flap with no drainable fluid collection 6/21  - WBC trending up 20>23>28> 17.5   - Continue suppressive Keflex.  - He is not a candidate for pump exchange given severe RV dysfunction and poor self-care 5. Depression: Continue meds.  6. HTN: Stable today  7. OSA: Sleep study with very severe OSA (AHI 69/hr).  Continues to refuse therapy. We have arranged f/u in sleep clinic several times but he has not been compliant 8. H/o GI bleed: 08/2018 EGD completed and showed gastritis and nonbleeding duodenal ulcers, colonoscopy with 5 polyps removed. Protonix was increased to BID. No recent GI bleeding.  - Off ASA, continue warfarin.   - No bleeding issues.  9. Tobacco Abuse: Continues to smoke 2-3 cigs/day. - reinforced need to quit  Ambulate today. Consult cardiac rehab.    I reviewed the LVAD parameters from today, and compared the results to the patient's prior recorded data.  No programming changes were made.  The LVAD is functioning within specified parameters.  The patient performs LVAD self-test daily.  LVAD interrogation was negative for any significant power changes, alarms or PI events/speed drops.  LVAD equipment check completed and is in good working order.  Back-up equipment present.   LVAD education done on emergency procedures and precautions and reviewed exit site care.  Length of Stay: 3  Amy Clegg, NP 03/18/2020, 9:45 AM  VAD Team --- VAD ISSUES ONLY--- Pager [phone] (7am - 7am)  Advanced Heart Failure Team  Pager [phone] (M-F; 7a - 4p)  Please contact [facility] for night-coverage after hours (4p -7a ) and weekends on [email or web address]  Patient seen and examined with the above-signed Advanced Practice Provider and/or Housestaff. I personally reviewed laboratory data, imaging studies and relevant notes. I independently examined the patient and formulated the important aspects of the plan. I have edited the note to reflect any of my changes or salient points. I have personally discussed the plan with the patient and/or family.  Yesterday with frequent ab pain screaming out for dilaudid. Says pain is better today. Remains on IVF. WBC coming down. Afebrile. Tolerating clear liquid diet. INR 2.7. No bleeding. HF status ok.   General:  NAD.  HEENT: normal  Neck: supple. JVP not elevated.  Carotids 2+ bilat; no bruits. No lymphadenopathy or thryomegaly appreciated. Cor: LVAD hum.  Lungs: Clear. Abdomen: obese soft, nontender, non-distended. No hepatosplenomegaly. No bruits or masses. Good bowel sounds. Driveline site clean. Anchor in place.  Extremities: no cyanosis, clubbing, rash. Warm no edema  Neuro: alert & oriented x 3. No focal deficits. Moves all 4 without problem   Required significant amount of narcotics yesterday. Says pain is better  today. Wean as tolerated. WBC coming down. Can likely advance diet. HF stable. VAD interrogated personally. Parameters stable. INR 2.7. Discussed dosing with PharmD personally. Ambulate.   Arvilla Meres, MD  11:36 AM

## 2020-03-18 NOTE — Progress Notes (Signed)
CARDIAC REHAB PHASE I   PRE:  Rate/Rhythm: 89 SR  BP:  Supine:   Sitting: 99/68  (78) Standing:    SaO2: 97%RA  MODE:  Ambulation: 400 ft   POST:  Rate/Rhythm: 108 ST  BP:  Supine:   Sitting: 106/93 (99)  Standing:    SaO2: 98%RA 1355-1400 Checked with pt and he asked that I come back in 30 minutes.  [id number] Pt able to don equipment with little help. Made sure not pulling on driveline. Pt walked to bathroom and then 400 ft with steady gait. Tolerated well. Back to sitting on side of bed after walk with a little DOE. Pt stated walk increased abdominal pain . Pain lessened with rest. Requested that I ask RN if time for pain med. Notified RN. Pt put batteries back to power source.   Luetta Nutting, RN BSN  03/18/2020 3:01 PM

## 2020-03-18 NOTE — Progress Notes (Signed)
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  Pain is described as dull.  Onset 03/13/2020 while watching football after drinking approximately 1 pint of alcohol.  Denies any previous symptoms previously.  In the ED, patient afebrile.  Lipase elevated 367.  CT abdomen/pelvis with acute interstitial edematous pancreatitis without evidence of pancreatic necrosis or peripancreatic fluid collection.  Admitted by heart failure service.  TRH consulted for assistance with management of acute pancreatitis.   Assessment & Plan:   Principal Problem:   Acute pancreatitis Active Problems:   LVAD (left ventricular assist device) present (HCC)   Alcohol use disorder, moderate, in early remission (HCC)   CHF (congestive heart failure) (HCC)   Acute alcoholic pancreatitis Patient presenting to the ED with acute onset abdominal pain radiating towards his back after drinking approximately 1 pint of alcohol.  Lipase elevated at 367 with CT findings of acute interstitial edematous pancreatitis without necrosis or peripancreatic fluid collection. --FLD, currently tolerating w/o nausea or vomiting, advance to soft diet today --oxycodone 5 mg q4h prn moderate pain  --Antiemetics as needed --Discussed need for complete alcohol cessation  EtOH use disorder Patient with continued alcohol use, 1 pint reported on 03/13/2020.  Endorses previous tremors following alcohol cessation, but denies any history of seizures from alcohol withdrawal. --CIWAA protocol with symptom triggered  Ativan --Folic acid, thiamine, multivitamin --Continue to encourage EtOH cessation  Tobacco use disorder Continues to endorse 2-3 cigarettes/day.  Counseled on need for complete cessation.  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  Treated with Ancef and p.o. rifabutin, continues on chronic Keflex suppression therapy. --Further per cardiology  Leukocytosis History of recurrent driveline infection as above.  Unclear etiology, afebrile. --WBC 20>23>28>17.5 (6.2 on 10/27) --On suppressive Keflex as above  Appreciate the opportunity to participate in the care of this most interesting patient.  We will continue to follow along with you.  DVT prophylaxis: SCDs Code Status:   Code Status: Partial Code Family Communication: None present at bedside  Disposition Plan: Per primary cardiology   Procedures:   None  Antimicrobials:   On Keflex for chronic suppression   Subjective: Patient seen and examined at bedside, resting comfortably.  Tolerating full liquid diet.  Continues with some low abdominal discomfort without nausea or vomiting.  Wishes for something more to eat today.  Will advance diet to soft.  Patient asked how he can prevent this in the future, once again discussed with him needs complete alcohol cessation.  No other complaints or concerns at this time. Denies headache, no fever/chills/night sweats, no nausea/vomiting/diarrhea, no chest pain, no palpitations, no shortness of breath, no weakness, no fatigue.  No acute events overnight per nursing staff.  Objective: Vitals:   03/17/20 2309 03/18/20 0323 03/18/20 0327 03/18/20 0718  BP: 115/89 101/70  (!) 106/93  Pulse: (!) 120 92  83  Resp: 17 20  20   Temp: 98.3  F (36.8 C)  98.1 F (36.7 C)  97.9 F (36.6 C)  TempSrc: Oral Oral  Oral  SpO2: 93% 92%  94%  Weight:   89.7 kg   Height:        Intake/Output Summary (Last 24 hours) at 03/18/2020 1120 Last data filed at 03/18/2020 0317 Gross per 24 hour  Intake 2534.17 ml  Output 375 ml  Net 2159.17 ml   Filed Weights   03/16/20 0117 03/17/20 0318 03/18/20 0327  Weight: 88.9 kg 88.4 kg 89.7 kg    Examination:  General exam: Appears calm and comfortable  Respiratory system: Clear to auscultation. Respiratory effort normal. Cardiovascular system: LVAD hum appreciated, no peripheral edema, LVAD noted in place Gastrointestinal system: Abdomen is nondistended, soft, mild tenderness to palpation low midline abdomen. No organomegaly or masses felt. Normal bowel sounds heard. Central nervous system: Alert and oriented. No focal neurological deficits. Extremities: Symmetric 5 x 5 power. Skin: No rashes, lesions or ulcers Psychiatry: Judgement and insight appear normal. Mood & affect appropriate.     Data Reviewed: I have personally reviewed following labs and imaging studies  CBC: Recent Labs  Lab 03/15/20 1128 03/15/20 1538 03/16/20 0123 03/17/20 0405 03/18/20 0044  WBC 21.0* 20.8* 23.2* 28.6* 17.5*  NEUTROABS  --   --   --   --  13.5*  HGB 15.0 15.1 13.6 12.7* 11.4*  HCT 47.1 48.3 42.6 40.6 36.1*  MCV 83.4 85.5 84.4 85.3 85.1  PLT 203 207 182 167 176   Basic Metabolic Panel: Recent Labs  Lab 03/12/20 1005 03/15/20 1128 03/15/20 1538 03/17/20 0405 03/18/20 0044  NA 138 132* 132* 130* 132*  K 3.7 3.2* 3.3* 3.4* 3.7  CL 105 98 98 97* 100  CO2 20* 20* 21* 22 22  GLUCOSE 120* 126* 91 91 72  BUN 9 12 12 15 10   CREATININE 0.98 1.15 1.07 1.21 0.84  CALCIUM 8.6* 9.1 8.9 8.7* 8.6*  MG  --   --  1.8  --   --   PHOS  --   --  3.3  --   --    GFR: Estimated Creatinine Clearance: 126.4 mL/min (by C-G formula based on SCr of 0.84 mg/dL). Liver Function Tests: Recent Labs  Lab  03/12/20 1005 03/15/20 1128 03/15/20 1538  AST 29 20 20   ALT 13 12 13   ALKPHOS 118 138* 134*  BILITOT 0.9 2.3* 1.7*  PROT 7.3 9.1* 9.0*  ALBUMIN 3.3* 3.9 3.9   Recent Labs  Lab 03/15/20 1128 03/17/20 0405  LIPASE 367* 51   No results for input(s): AMMONIA in the last 168 hours. Coagulation Profile: Recent Labs  Lab 03/15/20 1302 03/15/20 1538 03/16/20 0123 03/17/20 0405 03/18/20 0044  INR 2.4* 2.2* 2.5* 2.8* 2.7*   Cardiac Enzymes: No results for input(s): CKTOTAL, CKMB, CKMBINDEX, TROPONINI in the last 168 hours. BNP (last 3 results) No results for input(s): PROBNP in the last 8760 hours. HbA1C: No results for input(s): HGBA1C in the last 72 hours. CBG: Recent Labs  Lab 03/17/20 1636  GLUCAP 82   Lipid Profile: No results for input(s): CHOL, HDL, LDLCALC, TRIG, CHOLHDL, LDLDIRECT in the last 72 hours. Thyroid Function Tests: No results for input(s): TSH, T4TOTAL, FREET4, T3FREE, THYROIDAB in the last 72 hours. Anemia Panel: No results for input(s): VITAMINB12, FOLATE, FERRITIN, TIBC, IRON, RETICCTPCT in the last 72 hours. Sepsis Labs: Recent Labs  Lab 03/18/20 0044  PROCALCITON 2.30    Recent Results (from the past 240 hour(s))  Respiratory Panel by RT  PCR (Flu A&B, Covid) - Nasopharyngeal Swab     Status: None   Collection Time: 03/15/20  4:26 PM   Specimen: Nasopharyngeal Swab  Result Value Ref Range Status   SARS Coronavirus 2 by RT PCR NEGATIVE NEGATIVE Final    Comment: (NOTE) SARS-CoV-2 target nucleic acids are NOT DETECTED.  The SARS-CoV-2 RNA is generally detectable in upper respiratoy specimens during the acute phase of infection. The lowest concentration of SARS-CoV-2 viral copies this assay can detect is 131 copies/mL. A negative result does not preclude SARS-Cov-2 infection and should not be used as the sole basis for treatment or other patient management decisions. A negative result may occur with  improper specimen  collection/handling, submission of specimen other than nasopharyngeal swab, presence of viral mutation(s) within the areas targeted by this assay, and inadequate number of viral copies (<131 copies/mL). A negative result must be combined with clinical observations, patient history, and epidemiological information. The expected result is Negative.  Fact Sheet for Patients:  https://www.moore.com/  Fact Sheet for Healthcare Providers:  https://www.young.biz/  This test is no t yet approved or cleared by the [place] FDA and  has been authorized for detection and/or diagnosis of SARS-CoV-2 by FDA under an Emergency Use Authorization (EUA). This EUA will remain  in effect (meaning this test can be used) for the duration of the COVID-19 declaration under Section 564(b)(1) of the Act, 21 U.S.C. section 360bbb-3(b)(1), unless the authorization is terminated or revoked sooner.     Influenza A by PCR NEGATIVE NEGATIVE Final   Influenza B by PCR NEGATIVE NEGATIVE Final    Comment: (NOTE) The Xpert Xpress SARS-CoV-2/FLU/RSV assay is intended as an aid in  the diagnosis of influenza from Nasopharyngeal swab specimens and  should not be used as a sole basis for treatment. Nasal washings and  aspirates are unacceptable for Xpert Xpress SARS-CoV-2/FLU/RSV  testing.  Fact Sheet for Patients: https://www.moore.com/  Fact Sheet for Healthcare Providers: https://www.young.biz/  This test is not yet approved or cleared by the [place] FDA and  has been authorized for detection and/or diagnosis of SARS-CoV-2 by  FDA under an Emergency Use Authorization (EUA). This EUA will remain  in effect (meaning this test can be used) for the duration of the  Covid-19 declaration under Section 564(b)(1) of the Act, 21  U.S.C. section 360bbb-3(b)(1), unless the authorization is  terminated or revoked. Performed at [facility], [address] [place], [place] [address]   MRSA PCR Screening     Status: None   Collection Time: 03/16/20  4:28 AM   Specimen: Nasal Mucosa; Nasopharyngeal  Result Value Ref Range Status   MRSA by PCR NEGATIVE NEGATIVE Final    Comment:        The GeneXpert MRSA Assay (FDA approved for NASAL specimens only), is one component of a comprehensive MRSA colonization surveillance program. It is not intended to diagnose MRSA infection nor to guide or monitor treatment for MRSA infections. Performed at [facility], [address], [place], [place] [address]          Radiology Studies: No results found.      Scheduled Meds: . cephALEXin  500 mg Oral TID  . docusate sodium  100 mg Oral BID  . DULoxetine  60 mg Oral Daily  . folic acid  1 mg Oral Daily  . gabapentin  300 mg Oral TID  . multivitamin with minerals  1 tablet Oral Daily  . pantoprazole  40 mg Oral BID  . sildenafil  20 mg Oral TID  . spironolactone  12.5 mg Oral Daily  . thiamine  100 mg Oral Daily  . warfarin  2.5 mg Oral ONCE-1600  . Warfarin - Pharmacist Dosing Inpatient   Does not apply q1600   Continuous Infusions:    LOS: 3 days    Time spent: 32 minutes spent on chart review, discussion with nursing staff, consultants, updating family and interview/physical exam; more than 50% of that time was spent in counseling and/or coordination of care.    Alvira Philips [place], DO [facility] Available via Epic secure chat 7am-7pm After these hours, please refer to coverage provider listed on [email or web address] 03/18/2020, 11:20 AM

## 2020-03-18 NOTE — Progress Notes (Signed)
LVAD Coordinator Rounding Note:  Admitted 03/15/20 to Dr. Conception Chancy due to abdominal pain from non-nectrotizing acute pancreatitis.   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 Maren Beach under Destination Therapy criteria.  Pt sitting up on the side of the bed upon my arrival. States that his pain is better this morning, "but still hurts." Encouraged to get up out of bed and move around as pain allows.   Vital signs: Temp: 97.9 HR: 94 Doppler Pressure: 94 Automatic BP: 106/93 (99) O2 Sat: 94% RA Wt:  195.9>194.8>197.7 lbs   LVAD interrogation reveals:  Speed: 6500 Flow: 6.3 Power: 5.9 w PI: 2.4 Alarms: none Events: 1 PI Hematocrit: 36  Fixed speed: 6500 Low speed limit: 6000  Drive Line: dressing C/D/I with anchor intact and accurately applied. Weekly dressing changes per [facility] nurse; next dressing change due 03/19/20.  Labs:  LDH trend: [phone]  INR trend: 2.2>2.5>2.8>2.7  WBC trend: 21.0>23.2>28.6>17.5  Anticoagulation Plan: -INR Goal: 2.0 - 2.5 -ASA Dose: none  Device:  N/A  Plan/Recommendations:  1. Call VAD pager for VAD equipment or drive line issues. 2. Weekly dressing changes per BS nurse; next dressing change due Wednesday, 03/19/20.   Alyce Pagan RN VAD Coordinator  Office: [phone]  24/7 Pager: [phone]

## 2020-03-18 NOTE — Progress Notes (Signed)
ANTICOAGULATION CONSULT NOTE   Pharmacy Consult for warfarin Indication: LVAD  No Known Allergies  Patient Measurements: Height: 6\' 2"  (188 cm) Weight: 89.7 kg (197 lb 12 oz) IBW/kg (Calculated) : 82.2  Vital Signs: Temp: 97.9 F (36.6 C) (11/02 0718) Temp Source: Oral (11/02 0718) BP: 106/93 (11/02 0718) Pulse Rate: 83 (11/02 0718)  Labs: Recent Labs    03/15/20 1538 03/15/20 1538 03/16/20 0123 03/16/20 0123 03/17/20 0405 03/18/20 0044  HGB 15.1   < > 13.6   < > 12.7* 11.4*  HCT 48.3   < > 42.6  --  40.6 36.1*  PLT 207   < > 182  --  167 176  LABPROT 24.0*   < > 25.9*  --  28.7* 27.9*  INR 2.2*   < > 2.5*  --  2.8* 2.7*  CREATININE 1.07  --   --   --  1.21 0.84   < > = values in this interval not displayed.    Estimated Creatinine Clearance: 126.4 mL/min (by C-G formula based on SCr of 0.84 mg/dL).   Medical History: Past Medical History:  Diagnosis Date  . Asthma   . CHF (congestive heart failure) (HCC)    a. 09/2016: EF 20-25% with cath showing normal cors  . GERD (gastroesophageal reflux disease)   . History of hiatal hernia   . LVAD (left ventricular assist device) present (HCC)   . OSA on CPAP 09/06/2018   Severe OSA with AHI 68/hr on CPAP at 12cm H2O    Assessment: 49 YOM presenting with abdominal pain, LVAD pt on warfarin PTA with last dose taken today 10/30, INR 2.2 on admission.  CBC wnl.    INR up to 2.7 today, therapeutic. H/H, plt drop due to fluids. No reported bleeding.   Pt rerpoted PTA dosing: 7.5mg  MWF, 5mg  all other days but 03/12/20 Anticoag visit dose is 5 mg MWF, 2.5mg  TTSS  Goal of Therapy:  INR 2.0-2.5 per HF team Monitor platelets by anticoagulation protocol: Yes   Plan:  Warfarin 2.5 mg x1 per Anticoag clinic dose  Monitor daily INR, CBC/plt Monitor for signs/symptoms of bleeding   , [facility] Clinical Pharmacist  03/18/2020 9:29 AM   [facility] pharmacy phone numbers are listed on [email or web address]

## 2020-03-19 ENCOUNTER — Other Ambulatory Visit (HOSPITAL_COMMUNITY): Payer: Medicare Other

## 2020-03-19 DIAGNOSIS — F1021 Alcohol dependence, in remission: Secondary | ICD-10-CM

## 2020-03-19 DIAGNOSIS — I5082 Biventricular heart failure: Secondary | ICD-10-CM

## 2020-03-19 LAB — CBC WITH DIFFERENTIAL/PLATELET
Abs Immature Granulocytes: 0.1 10*3/uL — ABNORMAL HIGH (ref 0.00–0.07)
Basophils Absolute: 0.1 10*3/uL (ref 0.0–0.1)
Basophils Relative: 1 %
Eosinophils Absolute: 0.3 10*3/uL (ref 0.0–0.5)
Eosinophils Relative: 3 %
HCT: 34.9 % — ABNORMAL LOW (ref 39.0–52.0)
Hemoglobin: 11 g/dL — ABNORMAL LOW (ref 13.0–17.0)
Immature Granulocytes: 1 %
Lymphocytes Relative: 12 %
Lymphs Abs: 1.1 10*3/uL (ref 0.7–4.0)
MCH: 26.8 pg (ref 26.0–34.0)
MCHC: 31.5 g/dL (ref 30.0–36.0)
MCV: 85.1 fL (ref 80.0–100.0)
Monocytes Absolute: 1.2 10*3/uL — ABNORMAL HIGH (ref 0.1–1.0)
Monocytes Relative: 13 %
Neutro Abs: 6.3 10*3/uL (ref 1.7–7.7)
Neutrophils Relative %: 70 %
Platelets: 204 10*3/uL (ref 150–400)
RBC: 4.1 MIL/uL — ABNORMAL LOW (ref 4.22–5.81)
RDW: 18.1 % — ABNORMAL HIGH (ref 11.5–15.5)
WBC: 9 10*3/uL (ref 4.0–10.5)
nRBC: 0 % (ref 0.0–0.2)

## 2020-03-19 LAB — BASIC METABOLIC PANEL
Anion gap: 9 (ref 5–15)
BUN: 8 mg/dL (ref 6–20)
CO2: 24 mmol/L (ref 22–32)
Calcium: 8.7 mg/dL — ABNORMAL LOW (ref 8.9–10.3)
Chloride: 99 mmol/L (ref 98–111)
Creatinine, Ser: 0.78 mg/dL (ref 0.61–1.24)
GFR, Estimated: >60 mL/min (ref >60)
Glucose, Bld: 109 mg/dL — ABNORMAL HIGH (ref 70–99)
Potassium: 3.7 mmol/L (ref 3.5–5.1)
Sodium: 132 mmol/L — ABNORMAL LOW (ref 135–145)

## 2020-03-19 LAB — PROTIME-INR
INR: 3 — ABNORMAL HIGH (ref 0.8–1.2)
Prothrombin Time: 30.5 seconds — ABNORMAL HIGH (ref 11.4–15.2)

## 2020-03-19 LAB — LACTATE DEHYDROGENASE: LDH: 204 U/L — ABNORMAL HIGH (ref 98–192)

## 2020-03-19 LAB — PROCALCITONIN: Procalcitonin: 1.51 ng/mL

## 2020-03-19 MED ORDER — WARFARIN SODIUM 2.5 MG PO TABS: 2.5000 mg | ORAL_TABLET | Freq: Once | ORAL | Status: DC

## 2020-03-19 MED ORDER — WARFARIN SODIUM 1 MG PO TABS
1.0000 mg | ORAL_TABLET | Freq: Once | ORAL | Status: AC
  Administered 2020-03-19: 1 mg via ORAL
  Filled 2020-03-19: qty 1

## 2020-03-19 MED ORDER — WARFARIN SODIUM 2 MG PO TABS: 2.0000 mg | ORAL_TABLET | Freq: Once | ORAL | Status: DC

## 2020-03-19 NOTE — Progress Notes (Addendum)
Patient ID: Jonathan Wood, male   DOB: November 25, 1970, 49 y.o.   MRN: [id number]   Advanced Heart Failure VAD Team Note  PCP-Cardiologist: Arvilla Meres, MD   Subjective:   Yesterday advanced to soft diet.   WBC trending up 20>23>28 >17.5>9  Feeling better. Tolerating a soft diet. Had BM last night.   LVAD INTERROGATION:  HeartMate 3 LVAD:   Flow 6.2iters/min, speed 6500, power 6, PI 2.7 Rare PI events   Objective:    Vital Signs:   Temp:  [97.3 F (36.3 C)-98.1 F (36.7 C)] 97.4 F (36.3 C) (11/03 0335) Pulse Rate:  [69-91] 69 (11/03 0335) Resp:  [18-35] 18 (11/03 0335) BP: (112-130)/(63-99) 119/97 (11/03 0335) SpO2:  [91 %-99 %] 99 % (11/03 0335) Weight:  [90.7 kg] 90.7 kg (11/03 0335)   Mean arterial Pressure 90s   Intake/Output:   Intake/Output Summary (Last 24 hours) at 03/19/2020 0820 Last data filed at 03/18/2020 1815 Gross per 24 hour  Intake 600 ml  Output 450 ml  Net 150 ml     Physical Exam   Physical Exam: GENERAL: No acute distress. In bed.  HEENT: normal  NECK: Supple, JVP 5-6  .  2+ bilaterally, no bruits.  No lymphadenopathy or thyromegaly appreciated.   CARDIAC:  Mechanical heart sounds with LVAD hum present.  LUNGS:  Clear to auscultation bilaterally.  ABDOMEN:  Soft, round, nontender, positive bowel sounds x4.     LVAD exit site: well-healed and incorporated.  Dressing dry and intact.  No erythema or drainage.  Stabilization device present and accurately applied.  Driveline dressing is being changed daily per sterile technique. EXTREMITIES:  Warm and dry, no cyanosis, clubbing, rash or edema  NEUROLOGIC:  Alert and oriented x 3.    No aphasia.  No dysarthria.  Affect pleasant.           Telemetry   NSR   Labs   Basic Metabolic Panel: Recent Labs  Lab 03/15/20 1128 03/15/20 1128 03/15/20 1538 03/15/20 1538 03/17/20 0405 03/18/20 0044 03/19/20 0027  NA 132*  --  132*  --  130* 132* 132*  K 3.2*  --  3.3*  --  3.4* 3.7 3.7    CL 98  --  98  --  97* 100 99  CO2 20*  --  21*  --  22 22 24   GLUCOSE 126*  --  91  --  91 72 109*  BUN 12  --  12  --  15 10 8   CREATININE 1.15  --  1.07  --  1.21 0.84 0.78  CALCIUM 9.1   < > 8.9   < > 8.7* 8.6* 8.7*  MG  --   --  1.8  --   --   --   --   PHOS  --   --  3.3  --   --   --   --    < > = values in this interval not displayed.    Liver Function Tests: Recent Labs  Lab 03/12/20 1005 03/15/20 1128 03/15/20 1538  AST 29 20 20   ALT 13 12 13   ALKPHOS 118 138* 134*  BILITOT 0.9 2.3* 1.7*  PROT 7.3 9.1* 9.0*  ALBUMIN 3.3* 3.9 3.9   Recent Labs  Lab 03/15/20 1128 03/17/20 0405  LIPASE 367* 51   No results for input(s): AMMONIA in the last 168 hours.  CBC: Recent Labs  Lab 03/15/20 1538 03/16/20 0123 03/17/20 0405 03/18/20 0044 03/19/20 03/18/20  WBC 20.8* 23.2* 28.6* 17.5* 9.0  NEUTROABS  --   --   --  13.5* 6.3  HGB 15.1 13.6 12.7* 11.4* 11.0*  HCT 48.3 42.6 40.6 36.1* 34.9*  MCV 85.5 84.4 85.3 85.1 85.1  PLT 207 182 167 176 204    INR: Recent Labs  Lab 03/15/20 1538 03/16/20 0123 03/17/20 0405 03/18/20 0044 03/19/20 0027  INR 2.2* 2.5* 2.8* 2.7* 3.0*    Other results:  EKG:    Imaging   No results found.   Medications:     Scheduled Medications: . cephALEXin  500 mg Oral TID  . docusate sodium  100 mg Oral BID  . DULoxetine  60 mg Oral Daily  . folic acid  1 mg Oral Daily  . gabapentin  300 mg Oral TID  . multivitamin with minerals  1 tablet Oral Daily  . pantoprazole  40 mg Oral BID  . sildenafil  20 mg Oral TID  . spironolactone  12.5 mg Oral Daily  . thiamine  100 mg Oral Daily  . Warfarin - Pharmacist Dosing Inpatient   Does not apply q1600    Infusions:   PRN Medications: acetaminophen, albuterol, hydrOXYzine, ondansetron (ZOFRAN) IV, oxyCODONE   Assessment/Plan:    1. Acute pancreatitis: Suspect due to ETOH abuse.  CT abdomen suggestive of pancreatitis, no evidence for necrosis.  He admits to drinking.  No  gallstones seen. Hydrated with IV fluids. Now off. Tolerating soft diet.  - Pain controlled with oxycodone.  - Continue oxycodone.  2. ETOH abuse: He understands that he needs to quit.  Prior withdrawal symptoms.  - CIWA protocol.  - Thiamine/folate.  3. Chronic systolic HF with prominent RV failure. EF 10% s/p HM-3 LVAD on 09/06/17.  Admitted 8/21 for cardiogenic shock with severe RV failure. Treated with milrinone and then weaned off. Diuresed 40 pounds. VAD speed increased, currently at 6500 rpm.  -   LVAD parameters stable. LDH stable.   -Volume status stable.  Hold diuretic with pancreatitis.  -   Continue sildenafil.  - INR goal 2-2.5. INR up to 3 today. Discussed with pharmacy.  4. RecurrentDL infection with subxiphoid abscess: s/p debridement and pectoral flap in 4/21.  Had recurrent superficial subxiphoid abscess. CT with posible fluid collection. Evaluated by TCTS And IR and found to be muscle flap with no drainable fluid collection 6/21  - WBC trending up 20>23>28> 17.5>9    - Continue suppressive Keflex.  - He is not a candidate for pump exchange given severe RV dysfunction and poor self-care 5. Depression: Continue meds.  6. HTN: Stable today  7. OSA: Sleep study with very severe OSA (AHI 69/hr).  Continues to refuse therapy. We have arranged f/u in sleep clinic several times but he has not been compliant 8. H/o GI bleed: 08/2018 EGD completed and showed gastritis and nonbleeding duodenal ulcers, colonoscopy with 5 polyps removed. Protonix was increased to BID. No recent GI bleeding.  - Off ASA, continue warfarin.   - No bleeding issues.  9. Tobacco Abuse: Continues to smoke 2-3 cigs/day. - reinforced need to quit   Ambulate. OOB and ambulate today.    I reviewed the LVAD parameters from today, and compared the results to the patient's prior recorded data.  No programming changes were made.  The LVAD is functioning within specified parameters.  The patient performs LVAD  self-test daily.  LVAD interrogation was negative for any significant power changes, alarms or PI events/speed drops.  LVAD equipment check completed  and is in good working order.  Back-up equipment present.   LVAD education done on emergency procedures and precautions and reviewed exit site care.  Length of Stay: 4  Amy Clegg, NP 03/19/2020, 8:20 AM  VAD Team --- VAD ISSUES ONLY--- Pager [phone] (7am - 7am)  Advanced Heart Failure Team  Pager [phone] (M-F; 7a - 4p)  Please contact [facility] for night-coverage after hours (4p -7a ) and weekends on [email or web address]   Patient seen and examined with the above-signed Advanced Practice Provider and/or Housestaff. I personally reviewed laboratory data, imaging studies and relevant notes. I independently examined the patient and formulated the important aspects of the plan. I have edited the note to reflect any of my changes or salient points. I have personally discussed the plan with the patient and/or family.  Ab pain much improved. Able to tolerate bland diet. Volume status ok. WBC back down to normal. VAD interrogated personally. Parameters stable. INR 3.0  General:  NAD.  HEENT: normal  Neck: supple. JVP not elevated.  Carotids 2+ bilat; no bruits. No lymphadenopathy or thryomegaly appreciated. Cor: LVAD hum.  Lungs: Clear. Abdomen: obese soft, nontender, non-distended. No hepatosplenomegaly. No bruits or masses. Good bowel sounds. Driveline site clean. Anchor in place.  Extremities: no cyanosis, clubbing, rash. Warm no edema  Neuro: alert & oriented x 3. No focal deficits. Moves all 4 without problem   Pancreatitis resolving. HF stable. Continue to advanced diet and mobilize. Limit/stop narcotics. Possible home tomorrow.   Arvilla Meres, MD  9:17 AM

## 2020-03-19 NOTE — Progress Notes (Signed)
CARDIAC REHAB PHASE I   PRE:  Rate/Rhythm: 95 SR  BP:  Supine:   Sitting: 121/100 (106)  Standing:    SaO2: 96%RA  MODE:  Ambulation: 400 ft   POST:  Rate/Rhythm: 114 ST  BP:  Supine:   Sitting: 124/74 (88)  Standing:    SaO2: 95%RA [id number] RN in with pt who was getting ready to walk. In process of switching batteries .Took over walk. Pt walked 400 ft on RA with steady gait. Seemed a little more SOB and stopped once to rest but stated breathing OK.  Did say walking made his stomach hurt. Pt wanted to stay to batteries. Notified RN. Encouraged pt to walk at home for pulmonary rehab.   Luetta Nutting, RN BSN  03/19/2020 2:23 PM

## 2020-03-19 NOTE — Progress Notes (Addendum)
ANTICOAGULATION CONSULT NOTE   Pharmacy Consult for warfarin Indication: LVAD  No Known Allergies  Patient Measurements: Height: 6\' 2"  (188 cm) Weight: 90.7 kg (199 lb 15.3 oz) IBW/kg (Calculated) : 82.2  Vital Signs: Temp: 97.4 F (36.3 C) (11/03 0335) Temp Source: Oral (11/03 0335) BP: 119/97 (11/03 0335) Pulse Rate: 69 (11/03 0335)  Labs: Recent Labs    03/17/20 0405 03/17/20 0405 03/18/20 0044 03/19/20 0027  HGB 12.7*   < > 11.4* 11.0*  HCT 40.6  --  36.1* 34.9*  PLT 167  --  176 204  LABPROT 28.7*  --  27.9* 30.5*  INR 2.8*  --  2.7* 3.0*  CREATININE 1.21  --  0.84 0.78   < > = values in this interval not displayed.    Estimated Creatinine Clearance: 132.7 mL/min (by C-G formula based on SCr of 0.78 mg/dL).   Medical History: Past Medical History:  Diagnosis Date  . Asthma   . CHF (congestive heart failure) (HCC)    a. 09/2016: EF 20-25% with cath showing normal cors  . GERD (gastroesophageal reflux disease)   . History of hiatal hernia   . LVAD (left ventricular assist device) present (HCC)   . OSA on CPAP 09/06/2018   Severe OSA with AHI 68/hr on CPAP at 12cm H2O    Assessment: 49 YOM presenting with abdominal pain, LVAD pt on warfarin PTA with last dose taken today 10/30, INR 2.2 on admission.  CBC wnl.    INR up to 3 today, slightly supratherapeutic. H/H, plt stable today. No reported s/sx of bleeding.   Pt rerpoted PTA dosing: 7.5mg  MWF, 5mg  all other days but 03/12/20 Anticoag visit dose is 5 mg MWF, 2.5mg  TTSS  Goal of Therapy:  INR 2.0-2.5 per HF team Monitor platelets by anticoagulation protocol: Yes   Plan:  Warfarin 1 mg x1 Monitor daily INR, CBC/plt Monitor for signs/symptoms of bleeding   , PharmD, BCCCP Clinical Pharmacist  Phone: [phone] 03/19/2020 8:25 AM  Please check AMION for all [facility] Pharmacy phone numbers After 10:00 PM, call Main Pharmacy [phone]

## 2020-03-19 NOTE — Progress Notes (Signed)
LVAD Coordinator Rounding Note:  Admitted 03/15/20 to Dr. Conception Chancy due to abdominal pain from non-nectrotizing acute pancreatitis.   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 Maren Beach under Destination Therapy criteria.  Pt laying in bed this morning. States he walked yesterday with cardiac rehab. And plans to get up and walk in the hallways again this morning. His pain is controlled using PRN Oxy. He is excited that he may get to be discharged tomorrow as long as he continues to positively progress.   Vital signs: Temp: 98.0 HR: 80 Doppler Pressure: 102 Automatic BP: 105/91 (98) O2 Sat: 95% RA Wt:  195.9>194.8>197.7>199.9 lbs   LVAD interrogation reveals:  Speed: 6500 Flow: 6.1 Power: 5.9 w PI: 2.7 Alarms: none Events: 1 PI Hematocrit: 36  Fixed speed: 6500 Low speed limit: 6000  Drive Line: Existing VAD dressing removed and site care performed using sterile technique. Drive line exit site cleaned with Chlora prep applicators x 2, allowed to dry, and Sorbaview dressing with gauze re-applied. Exit site healed and incorporated, the velour is fully implanted at exit site. No redness, tenderness, drainage, foul odor or rash noted. Drive line anchor re-applied x 2. Covered with 2 large tegaderms. Weekly dressing changes per [facility] nurse; next dressing change due 03/26/20.  Labs:  LDH trend: 246>197>198>193>204  INR trend: 2.2>2.5>2.8>2.7>3.0  WBC trend: 21.0>23.2>28.6>17.5>9.0  Anticoagulation Plan: -INR Goal: 2.0 - 2.5 -ASA Dose: none  Device:  N/A  Plan/Recommendations:  1. Call VAD pager for VAD equipment or drive line issues. 2. Weekly dressing changes per BS nurse; next dressing change due Wednesday, 03/26/20.   Alyce Pagan RN VAD Coordinator  Office: [phone]  24/7 Pager: [phone]

## 2020-03-19 NOTE — Progress Notes (Signed)
PROGRESS NOTE    Jonathan Wood  XBM:[id number] DOB: 1970/08/21 DOA: 03/15/2020 PCP: Dolores Patty, MD    Assessment & Plan:   Principal Problem:   Acute pancreatitis Active Problems:   LVAD (left ventricular assist device) present (HCC)   Alcohol use disorder, moderate, in early remission (HCC)   CHF (congestive heart failure) (HCC)   Acute alcoholic pancreatitis Patient presenting to the ED with acute onset abdominal pain radiating towards his back after drinking approximately 1 pint of alcohol.  Lipase elevated at 367 with CT findings of acute interstitial edematous pancreatitis without necrosis or peripancreatic fluid collection. -tolerating soft diet --oxycodone 5 mg q4h prn moderate pain (PDMP reviewed, no concerning scripts) --Antiemetics as needed  EtOH use disorder Patient with continued alcohol use, 1 pint reported on 03/13/2020 --Folic acid, thiamine, multivitamin --needs alcohol cessation  Tobacco use disorder Continues to endorse 2-3 cigarettes/day.  Counseled on need for complete cessation.  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  Treated with Ancef and p.o. rifabutin, continues on chronic Keflex suppression therapy. --Further per cardiology  Leukocytosis --resolved --On suppressive Keflex as above   DVT prophylaxis: SCDs Code Status:   Code Status: Partial Code   Disposition Plan: Per primary cardiology, from a pancreatitis perspective if pain controlled and able to eat, can be d/c'd    Antimicrobials:   On Keflex for chronic suppression   Subjective: Tolerating soft diet  Objective: Vitals:   03/19/20 0200 03/19/20 0300 03/19/20 0335 03/19/20 0843  BP:   (!) 119/97 (!)  105/91  Pulse:   69 80  Resp: 20 (!) 21 18 20   Temp:   (!) 97.4 F (36.3 C) 98 F (36.7 C)  TempSrc:   Oral Oral  SpO2:   99% 95%  Weight:   90.7 kg   Height:        Intake/Output Summary (Last 24 hours) at 03/19/2020 1038 Last data filed at 03/18/2020 1815 Gross per 24 hour  Intake 360 ml  Output 450 ml  Net -90 ml   Filed Weights   03/17/20 0318 03/18/20 0327 03/19/20 0335  Weight: 88.4 kg 89.7 kg 90.7 kg    Examination:  General exam: Appears calm and comfortable      Data Reviewed: I have personally reviewed following labs and imaging studies  CBC: Recent Labs  Lab 03/15/20 1538 03/16/20 0123 03/17/20 0405 03/18/20 0044 03/19/20 0027  WBC 20.8* 23.2* 28.6* 17.5* 9.0  NEUTROABS  --   --   --  13.5* 6.3  HGB 15.1 13.6 12.7* 11.4* 11.0*  HCT 48.3 42.6 40.6 36.1* 34.9*  MCV 85.5 84.4 85.3 85.1 85.1  PLT 207 182 167 176 204   Basic Metabolic Panel: Recent Labs  Lab 03/15/20 1128 03/15/20 1538 03/17/20 0405 03/18/20 0044 03/19/20 0027  NA 132* 132* 130* 132* 132*  K 3.2* 3.3* 3.4* 3.7 3.7  CL 98 98 97* 100 99  CO2 20* 21* 22 22 24   GLUCOSE 126* 91 91 72 109*  BUN 12 12 15 10 8   CREATININE 1.15 1.07 1.21 0.84 0.78  CALCIUM 9.1 8.9 8.7* 8.6* 8.7*  MG  --  1.8  --   --   --   PHOS  --  3.3  --   --   --  GFR: Estimated Creatinine Clearance: 132.7 mL/min (by C-G formula based on SCr of 0.78 mg/dL). Liver Function Tests: Recent Labs  Lab 03/15/20 1128 03/15/20 1538  AST 20 20  ALT 12 13  ALKPHOS 138* 134*  BILITOT 2.3* 1.7*  PROT 9.1* 9.0*  ALBUMIN 3.9 3.9   Recent Labs  Lab 03/15/20 1128 03/17/20 0405  LIPASE 367* 51   No results for input(s): AMMONIA in the last 168 hours. Coagulation Profile: Recent Labs  Lab 03/15/20 1538 03/16/20 0123 03/17/20 0405 03/18/20 0044 03/19/20 0027  INR 2.2* 2.5* 2.8* 2.7* 3.0*   Cardiac Enzymes: No results for input(s): CKTOTAL, CKMB, CKMBINDEX, TROPONINI in the last 168 hours. BNP (last 3  results) No results for input(s): PROBNP in the last 8760 hours. HbA1C: No results for input(s): HGBA1C in the last 72 hours. CBG: Recent Labs  Lab 03/17/20 1636  GLUCAP 82   Lipid Profile: No results for input(s): CHOL, HDL, LDLCALC, TRIG, CHOLHDL, LDLDIRECT in the last 72 hours. Thyroid Function Tests: No results for input(s): TSH, T4TOTAL, FREET4, T3FREE, THYROIDAB in the last 72 hours. Anemia Panel: No results for input(s): VITAMINB12, FOLATE, FERRITIN, TIBC, IRON, RETICCTPCT in the last 72 hours. Sepsis Labs: Recent Labs  Lab 03/18/20 0044 03/19/20 0027  PROCALCITON 2.30 1.51    Recent Results (from the past 240 hour(s))  Respiratory Panel by RT PCR (Flu A&B, Covid) - Nasopharyngeal Swab     Status: None   Collection Time: 03/15/20  4:26 PM   Specimen: Nasopharyngeal Swab  Result Value Ref Range Status   SARS Coronavirus 2 by RT PCR NEGATIVE NEGATIVE Final    Comment: (NOTE) SARS-CoV-2 target nucleic acids are NOT DETECTED.  The SARS-CoV-2 RNA is generally detectable in upper respiratoy specimens during the acute phase of infection. The lowest concentration of SARS-CoV-2 viral copies this assay can detect is 131 copies/mL. A negative result does not preclude SARS-Cov-2 infection and should not be used as the sole basis for treatment or other patient management decisions. A negative result may occur with  improper specimen collection/handling, submission of specimen other than nasopharyngeal swab, presence of viral mutation(s) within the areas targeted by this assay, and inadequate number of viral copies (<131 copies/mL). A negative result must be combined with clinical observations, patient history, and epidemiological information. The expected result is Negative.  Fact Sheet for Patients:  https://www.moore.com/  Fact Sheet for Healthcare Providers:  https://www.young.biz/  This test is no t yet approved or cleared by the  [place] FDA and  has been authorized for detection and/or diagnosis of SARS-CoV-2 by FDA under an Emergency Use Authorization (EUA). This EUA will remain  in effect (meaning this test can be used) for the duration of the COVID-19 declaration under Section 564(b)(1) of the Act, 21 U.S.C. section 360bbb-3(b)(1), unless the authorization is terminated or revoked sooner.     Influenza A by PCR NEGATIVE NEGATIVE Final   Influenza B by PCR NEGATIVE NEGATIVE Final    Comment: (NOTE) The Xpert Xpress SARS-CoV-2/FLU/RSV assay is intended as an aid in  the diagnosis of influenza from Nasopharyngeal swab specimens and  should not be used as a sole basis for treatment. Nasal washings and  aspirates are unacceptable for Xpert Xpress SARS-CoV-2/FLU/RSV  testing.  Fact Sheet for Patients: https://www.moore.com/  Fact Sheet for Healthcare Providers: https://www.young.biz/  This test is not yet approved or cleared by the [place] FDA and  has been authorized for detection and/or diagnosis of SARS-CoV-2 by  FDA under an  Emergency Use Authorization (EUA). This EUA will remain  in effect (meaning this test can be used) for the duration of the  Covid-19 declaration under Section 564(b)(1) of the Act, 21  U.S.C. section 360bbb-3(b)(1), unless the authorization is  terminated or revoked. Performed at [facility], [address] [place], [place] [address]   MRSA PCR Screening     Status: None   Collection Time: 03/16/20  4:28 AM   Specimen: Nasal Mucosa; Nasopharyngeal  Result Value Ref Range Status   MRSA by PCR NEGATIVE NEGATIVE Final    Comment:        The GeneXpert MRSA Assay (FDA approved for NASAL specimens only), is one component of a comprehensive MRSA colonization surveillance program. It is not intended to diagnose MRSA infection nor to guide or monitor treatment for MRSA infections. Performed at [facility], [address] [place], [place] [address]          Radiology Studies: No results found.      Scheduled Meds: . cephALEXin  500 mg Oral TID  . docusate sodium  100 mg Oral BID  . DULoxetine  60 mg Oral Daily  . gabapentin  300 mg Oral TID  . multivitamin with minerals  1 tablet Oral Daily  . pantoprazole  40 mg Oral BID  . sildenafil  20 mg Oral TID  . spironolactone  12.5 mg Oral Daily  . warfarin  1 mg Oral ONCE-1600  . Warfarin - Pharmacist Dosing Inpatient   Does not apply q1600   Continuous Infusions:    LOS: 4 days    Time spent: 32 minutes spent on chart review, discussion with nursing staff, consultants, updating family and interview/physical exam; more than 50% of that time was spent in counseling and/or coordination of care.    Joseph Art, DO [facility] Available via Epic secure chat 7am-7pm After these hours, please refer to coverage provider listed on [email or web address] 03/19/2020, 10:38 AM

## 2020-03-20 LAB — CBC WITH DIFFERENTIAL/PLATELET
Abs Immature Granulocytes: 0.18 10*3/uL — ABNORMAL HIGH (ref 0.00–0.07)
Basophils Absolute: 0.1 10*3/uL (ref 0.0–0.1)
Basophils Relative: 1 %
Eosinophils Absolute: 0.2 10*3/uL (ref 0.0–0.5)
Eosinophils Relative: 3 %
HCT: 36.5 % — ABNORMAL LOW (ref 39.0–52.0)
Hemoglobin: 11.5 g/dL — ABNORMAL LOW (ref 13.0–17.0)
Immature Granulocytes: 2 %
Lymphocytes Relative: 19 %
Lymphs Abs: 1.4 10*3/uL (ref 0.7–4.0)
MCH: 26.4 pg (ref 26.0–34.0)
MCHC: 31.5 g/dL (ref 30.0–36.0)
MCV: 83.7 fL (ref 80.0–100.0)
Monocytes Absolute: 0.8 10*3/uL (ref 0.1–1.0)
Monocytes Relative: 10 %
Neutro Abs: 4.8 10*3/uL (ref 1.7–7.7)
Neutrophils Relative %: 65 %
Platelets: 249 10*3/uL (ref 150–400)
RBC: 4.36 MIL/uL (ref 4.22–5.81)
RDW: 17.7 % — ABNORMAL HIGH (ref 11.5–15.5)
WBC: 7.4 10*3/uL (ref 4.0–10.5)
nRBC: 0 % (ref 0.0–0.2)

## 2020-03-20 LAB — BASIC METABOLIC PANEL
Anion gap: 9 (ref 5–15)
BUN: 9 mg/dL (ref 6–20)
CO2: 26 mmol/L (ref 22–32)
Calcium: 8.8 mg/dL — ABNORMAL LOW (ref 8.9–10.3)
Chloride: 99 mmol/L (ref 98–111)
Creatinine, Ser: 0.84 mg/dL (ref 0.61–1.24)
GFR, Estimated: >60 mL/min (ref >60)
Glucose, Bld: 126 mg/dL — ABNORMAL HIGH (ref 70–99)
Potassium: 3.5 mmol/L (ref 3.5–5.1)
Sodium: 134 mmol/L — ABNORMAL LOW (ref 135–145)

## 2020-03-20 LAB — LACTATE DEHYDROGENASE: LDH: 184 U/L (ref 98–192)

## 2020-03-20 LAB — PROTIME-INR
INR: 2.5 — ABNORMAL HIGH (ref 0.8–1.2)
Prothrombin Time: 25.9 seconds — ABNORMAL HIGH (ref 11.4–15.2)

## 2020-03-20 MED ORDER — TORSEMIDE 20 MG PO TABS: 20.0000 mg | ORAL_TABLET | ORAL | 3 refills | Status: DC

## 2020-03-20 MED ORDER — WARFARIN SODIUM 2.5 MG PO TABS: 2.5000 mg | ORAL_TABLET | Freq: Once | ORAL | Status: DC

## 2020-03-20 MED ORDER — OXYCODONE HCL 5 MG PO TABS
5.0000 mg | ORAL_TABLET | ORAL | 0 refills | Status: AC | PRN
Start: 2020-03-20 — End: 2020-03-23

## 2020-03-20 NOTE — TOC Transition Note (Signed)
Transition of Care [facility]) - CM/SW Discharge Note   Patient Details  Name: Jonathan Wood MRN: [id number] Date of Birth: Jul 20, 1970  Transition of Care [facility]) CM/SW Contact:  Leone Haven, RN Phone Number: 03/20/2020, 12:25 PM   Clinical Narrative:    Patient is from home with Mother, NCM gave patient SA resources.  He is for dc today, mother will transport him home. He has no other needs  Final next level of care: Home/Self Care Barriers to Discharge: No Barriers Identified   Patient Goals and CMS Choice     Choice offered to / list presented to : NA  Discharge Placement                       Discharge Plan and Services   Discharge Planning Services: CM Consult Post Acute Care Choice: NA            DME Agency: NA       HH Arranged: NA          Social Determinants of Health (SDOH) Interventions     Readmission Risk Interventions Readmission Risk Prevention Plan 03/20/2020 11/16/2019  Transportation Screening Complete Complete  PCP or Specialist Appt within 3-5 Days Complete Complete  HRI or Home Care Consult Complete Complete  Social Work Consult for Recovery Care Planning/Counseling Complete Complete  Palliative Care Screening Not Applicable Not Applicable  Medication Review Oceanographer) Complete Complete  Some recent data might be hidden

## 2020-03-20 NOTE — Progress Notes (Signed)
Pt discharge home, met mother at front of hospital. Pt taken to front of hospital via w/c

## 2020-03-20 NOTE — Progress Notes (Signed)
ANTICOAGULATION CONSULT NOTE   Pharmacy Consult for warfarin Indication: LVAD  No Known Allergies  Patient Measurements: Height: 6\' 2"  (188 cm) Weight: 88.7 kg (195 lb 8.8 oz) IBW/kg (Calculated) : 82.2  Vital Signs: Temp: 97.6 F (36.4 C) (11/04 0800) Temp Source: Oral (11/04 0800) BP: 134/123 (11/04 0800) Pulse Rate: 84 (11/04 0405)  Labs: Recent Labs    03/18/20 0044 03/18/20 0044 03/19/20 0027 03/20/20 0017  HGB 11.4*   < > 11.0* 11.5*  HCT 36.1*  --  34.9* 36.5*  PLT 176  --  204 249  LABPROT 27.9*  --  30.5* 25.9*  INR 2.7*  --  3.0* 2.5*  CREATININE 0.84  --  0.78 0.84   < > = values in this interval not displayed.    Estimated Creatinine Clearance: 126.4 mL/min (by C-G formula based on SCr of 0.84 mg/dL).   Medical History: Past Medical History:  Diagnosis Date   Asthma    CHF (congestive heart failure) (HCC)    a. 09/2016: EF 20-25% with cath showing normal cors   GERD (gastroesophageal reflux disease)    History of hiatal hernia    LVAD (left ventricular assist device) present (HCC)    OSA on CPAP 09/06/2018   Severe OSA with AHI 68/hr on CPAP at 12cm H2O    Assessment: 49 YOM presenting with abdominal pain, LVAD pt on warfarin PTA with last dose taken today 10/30, INR 2.2 on admission.  CBC wnl.    INR trending down to 2.5 today, therapeutic. H/H, plt stable today. No reported s/sx of bleeding.   Pt rerpoted PTA dosing: 7.5mg  MWF, 5mg  all other days but 03/12/20 Anticoag visit dose is 5 mg MWF, 2.5mg  TTSS  Goal of Therapy:  INR 2.0-2.5 per HF team Monitor platelets by anticoagulation protocol: Yes   Plan:  Warfarin 2.5 mg x1 Would resume PTA regimen at time of discharge  Monitor daily INR, CBC/plt Monitor for signs/symptoms of bleeding   , PharmD, BCCCP Clinical Pharmacist  Phone: [phone] 03/20/2020 8:40 AM  Please check AMION for all [facility] Pharmacy phone numbers After 10:00 PM, call Main Pharmacy [phone]

## 2020-03-20 NOTE — Progress Notes (Signed)
Patient's pancreatitis has improved.  Few days of oxycodone prescribed after PDMP reviewed.  Needs alcohol cessation. Marlin Canary DO

## 2020-03-20 NOTE — Discharge Instructions (Addendum)
Acute Pancreatitis  Acute pancreatitis happens when the pancreas gets swollen. The pancreas is a large gland in the body that helps to control blood sugar. It also makes enzymes that help to digest food. This condition can last a few days and cause serious problems. The lungs, heart, and kidneys may stop working. What are the causes? Causes include:  Alcohol abuse.  Drug abuse.  Gallstones.  A tumor in the pancreas. Other causes include:  Some medicines.  Some chemicals.  Diabetes.  An infection.  Damage caused by an accident.  The poison (venom) from a scorpion bite.  Belly (abdominal) surgery.  The body's defense system (immune system) attacking the pancreas (autoimmune pancreatitis).  Genes that are passed from parent to child (inherited). In some cases, the cause is not known. What are the signs or symptoms?  Pain in the upper belly that may be felt in the back. The pain may be very bad.  Swelling of the belly.  Feeling sick to your stomach (nauseous) and throwing up (vomiting).  Fever. How is this treated? You will likely have to stay in the hospital. Treatment may include:  Pain medicine.  Fluid through an IV tube.  Placing a tube in the stomach to take out the stomach contents. This may help you stop throwing up.  Not eating for 3-4 days.  Antibiotic medicines, if you have an infection.  Treating any other problems that may be the cause.  Steroid medicines, if your problem is caused by your defense system attacking your body's own tissues.  Surgery. Follow these instructions at home: Eating and drinking   Follow instructions from your doctor about what to eat and drink.  Eat foods that do not have a lot of fat in them.  Eat small meals often. Do not eat big meals.  Drink enough fluid to keep your pee (urine) pale yellow.  Do not drink alcohol if it caused your condition. Medicines  Take over-the-counter and prescription medicines only  as told by your doctor.  Ask your doctor if the medicine prescribed to you: ? Requires you to avoid driving or using heavy machinery. ? Can cause trouble pooping (constipation). You may need to take steps to prevent or treat trouble pooping:  Take over-the-counter or prescription medicines.  Eat foods that are high in fiber. These include beans, whole grains, and fresh fruits and vegetables.  Limit foods that are high in fat and sugar. These include fried or sweet foods. General instructions  Do not use any products that contain nicotine or tobacco, such as cigarettes, e-cigarettes, and chewing tobacco. If you need help quitting, ask your doctor.  Get plenty of rest.  Check your blood sugar at home as told by your doctor.  Keep all follow-up visits as told by your doctor. This is important. Contact a doctor if:  You do not get better as quickly as expected.  You have new symptoms.  Your symptoms get worse.  You have pain or weakness that lasts a long time.  You keep feeling sick to your stomach.  You get better and then you have pain again.  You have a fever. Get help right away if:  You cannot eat or keep fluids down.  Your pain gets very bad.  Your skin or the white part of your eyes turns yellow.  You have sudden swelling in your belly.  You throw up.  You feel dizzy or you pass out (faint).  Your blood sugar is high (over 300  mg/dL). Summary  Acute pancreatitis happens when the pancreas gets swollen.  This condition is often caused by alcohol abuse, drug abuse, or gallstones.  You will likely have to stay in the hospital for treatment. This information is not intended to replace advice given to you by your health care provider. Make sure you discuss any questions you have with your health care provider. Document Revised: 02/20/2018 Document Reviewed: 02/20/2018 Elsevier Patient Education  2020 [employer].   Information on my medicine - Coumadin    (Warfarin)  Why was Coumadin prescribed for you? Coumadin was prescribed for you because you have a blood clot or a medical condition that can cause an increased risk of forming blood clots. Blood clots can cause serious health problems by blocking the flow of blood to the heart, lung, or brain. Coumadin can prevent harmful blood clots from forming. As a reminder your indication for Coumadin is: LVAD  What test will check on my response to Coumadin? While on Coumadin (warfarin) you will need to have an INR test regularly to ensure that your dose is keeping you in the desired range. The INR (international normalized ratio) number is calculated from the result of the laboratory test called prothrombin time (PT).  If an INR APPOINTMENT HAS NOT ALREADY BEEN MADE FOR YOU please schedule an appointment to have this lab work done by your health care provider within 7 days. Your INR goal is usually a number between: 2-2.5.    What  do you need to  know  About  COUMADIN? Take Coumadin (warfarin) exactly as prescribed by your healthcare provider about the same time each day.  DO NOT stop taking without talking to the doctor who prescribed the medication.  Stopping without other blood clot prevention medication to take the place of Coumadin may increase your risk of developing a new clot or stroke.  Get refills before you run out.  What do you do if you miss a dose? If you miss a dose, take it as soon as you remember on the same day then continue your regularly scheduled regimen the next day.  Do not take two doses of Coumadin at the same time.  Important Safety Information A possible side effect of Coumadin (Warfarin) is an increased risk of bleeding. You should call your healthcare provider right away if you experience any of the following: ? Bleeding from an injury or your nose that does not stop. ? Unusual colored urine (red or dark brown) or unusual colored stools (red or black). ? Unusual bruising  for unknown reasons. ? A serious fall or if you hit your head (even if there is no bleeding).  Some foods or medicines interact with Coumadin (warfarin) and might alter your response to warfarin. To help avoid this: ? Eat a balanced diet, maintaining a consistent amount of Vitamin K. ? Notify your provider about major diet changes you plan to make. ? Avoid alcohol or limit your intake to 1 drink for women and 2 drinks for men per day. (1 drink is 5 oz. wine, 12 oz. beer, or 1.5 oz. liquor.)  Make sure that ANY health care provider who prescribes medication for you knows that you are taking Coumadin (warfarin).  Also make sure the healthcare provider who is monitoring your Coumadin knows when you have started a new medication including herbals and non-prescription products.  Coumadin (Warfarin)  Major Drug Interactions  Increased Warfarin Effect Decreased Warfarin Effect  Alcohol (large quantities) Antibiotics (esp. Septra/Bactrim, Flagyl,  Cipro) Amiodarone (Cordarone) Aspirin (ASA) Cimetidine (Tagamet) Megestrol (Megace) NSAIDs (ibuprofen, naproxen, etc.) Piroxicam (Feldene) Propafenone (Rythmol SR) Propranolol (Inderal) Isoniazid (INH) Posaconazole (Noxafil) Barbiturates (Phenobarbital) Carbamazepine (Tegretol) Chlordiazepoxide (Librium) Cholestyramine (Questran) Griseofulvin Oral Contraceptives Rifampin Sucralfate (Carafate) Vitamin K   Coumadin (Warfarin) Major Herbal Interactions  Increased Warfarin Effect Decreased Warfarin Effect  Garlic Ginseng Ginkgo biloba Coenzyme Q10 Green tea St. John's wort    Coumadin (Warfarin) FOOD Interactions  Eat a consistent number of servings per week of foods HIGH in Vitamin K (1 serving =  cup)  Collards (cooked, or boiled & drained) Kale (cooked, or boiled & drained) Mustard greens (cooked, or boiled & drained) Parsley *serving size only =  cup Spinach (cooked, or boiled & drained) Swiss chard (cooked, or boiled &  drained) Turnip greens (cooked, or boiled & drained)  Eat a consistent number of servings per week of foods MEDIUM-HIGH in Vitamin K (1 serving = 1 cup)  Asparagus (cooked, or boiled & drained) Broccoli (cooked, boiled & drained, or raw & chopped) Brussel sprouts (cooked, or boiled & drained) *serving size only =  cup Lettuce, raw (green leaf, endive, romaine) Spinach, raw Turnip greens, raw & chopped   These websites have more information on Coumadin (warfarin):  http://www.king-russell.com/; https://www.hines.net/;    Make sure you take your Coumadin this evening

## 2020-03-20 NOTE — TOC Initial Note (Signed)
Transition of Care [facility]) - Initial/Assessment Note    Patient Details  Name: Jonathan Wood MRN: [id number] Date of Birth: 1970/11/15  Transition of Care [facility]) CM/SW Contact:    Leone Haven, RN Phone Number: 03/20/2020, 12:23 PM  Clinical Narrative:                 Patient is from home with Mother, NCM gave patient SA resources.  He is for dc today, mother will transport him home. He has no other needs.  Expected Discharge Plan: Home/Self Care Barriers to Discharge: No Barriers Identified   Patient Goals and CMS Choice     Choice offered to / list presented to : NA  Expected Discharge Plan and Services Expected Discharge Plan: Home/Self Care   Discharge Planning Services: CM Consult Post Acute Care Choice: NA Living arrangements for the past 2 months: Single Family Home Expected Discharge Date: 03/20/20                 DME Agency: NA       HH Arranged: NA          Prior Living Arrangements/Services Living arrangements for the past 2 months: Single Family Home Lives with:: Parents Patient language and need for interpreter reviewed:: Yes Do you feel safe going back to the place where you live?: Yes      Need for Family Participation in Patient Care: Yes (Comment) Care giver support system in place?: Yes (comment)   Criminal Activity/Legal Involvement Pertinent to Current Situation/Hospitalization: No - Comment as needed  Activities of Daily Living Home Assistive Devices/Equipment: Scales, CBG Meter ADL Screening (condition at time of admission) Patient's cognitive ability adequate to safely complete daily activities?: Yes Is the patient deaf or have difficulty hearing?: No Does the patient have difficulty seeing, even when wearing glasses/contacts?: No Does the patient have difficulty concentrating, remembering, or making decisions?: No Patient able to express need for assistance with ADLs?: Yes Does the patient have difficulty dressing or  bathing?: No Independently performs ADLs?: Yes (appropriate for developmental age) Does the patient have difficulty walking or climbing stairs?: No Weakness of Legs: Both Weakness of Arms/Hands: None  Permission Sought/Granted                  Emotional Assessment Appearance:: Appears stated age Attitude/Demeanor/Rapport: Engaged Affect (typically observed): Appropriate Orientation: : Oriented to Self, Oriented to Place, Oriented to  Time, Oriented to Situation Alcohol / Substance Use: Not Applicable Psych Involvement: No (comment)  Admission diagnosis:  Acute pancreatitis [K85.90] CHF (congestive heart failure) (HCC) [I50.9] Acute pancreatitis, unspecified complication status, unspecified pancreatitis type [K85.90] Patient Active Problem List   Diagnosis Date Noted  . CHF (congestive heart failure) (HCC)   . Acute pancreatitis 03/15/2020  . Alcohol use disorder, moderate, in early remission (HCC) 01/01/2020  . Lactic acidosis 12/28/2019  . Hypoglycemia 12/28/2019  . Pain, dental 12/28/2019  . Generalized anxiety disorder 09/03/2019  . Cigarette smoker 08/18/2019  . COPD (chronic obstructive pulmonary disease) (HCC) 08/18/2019  . Hypertension 08/18/2019  . Bacteremia due to methicillin susceptible Staphylococcus aureus (MSSA) 08/18/2019  . Sepsis (HCC) 08/16/2019  . Increased abdominal girth 10/11/2018  . Chronic constipation 10/11/2018  . Rectal bleeding 10/11/2018  . History of colonic polyps 09/24/2018  . Hemorrhoids 09/24/2018  . Long term current use of anticoagulant therapy 09/24/2018  . OSA on CPAP 09/06/2018  . 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 Anxiety 09/30/2017  . Normocytic anemia 09/30/2017  . LVAD (left ventricular assist device) present (HCC)   . Advanced care planning/counseling discussion    . Goals of care, counseling/discussion   . Palliative care encounter   . Elevated LFTs 08/25/2017   PCP:  Dolores Patty, MD Pharmacy:   [facility] [place] - [address] [place] [address] Phone: [phone] Fax: [phone]     Social Determinants of Health (SDOH) Interventions    Readmission Risk Interventions Readmission Risk Prevention Plan 03/20/2020 11/16/2019  Transportation Screening Complete Complete  PCP or Specialist Appt within 3-5 Days Complete Complete  HRI or Home Care Consult Complete Complete  Social Work Consult for Recovery Care Planning/Counseling Complete Complete  Palliative Care Screening Not Applicable Not Applicable  Medication Review Oceanographer) Complete Complete  Some recent data might be hidden

## 2020-03-20 NOTE — Progress Notes (Addendum)
Patient ID: Jonathan Wood, male   DOB: Dec 30, 1970, 49 y.o.   MRN: [id number]   Advanced Heart Failure VAD Team Note  PCP-Cardiologist: Arvilla Meres, MD   Subjective:    Tolerating a diet. Pain better controlled today.   WBC trending up 20>23>28 >17.5>9>7.4   LVAD INTERROGATION:  HeartMate 3 LVAD:   Flow 5.9 iters/min, speed 6500, power 6, PI 3.3 Rare PI events   Objective:    Vital Signs:   Temp:  [97.6 F (36.4 C)-98.2 F (36.8 C)] 97.6 F (36.4 C) (11/04 0800) Pulse Rate:  [80-94] 84 (11/04 0405) Resp:  [16-20] 17 (11/04 0800) BP: (105-134)/(91-123) 134/123 (11/04 0800) SpO2:  [91 %-97 %] 94 % (11/04 0800) Weight:  [88.7 kg] 88.7 kg (11/04 0405)   Mean arterial Pressure 100s    Intake/Output:   Intake/Output Summary (Last 24 hours) at 03/20/2020 0828 Last data filed at 03/20/2020 0405 Gross per 24 hour  Intake --  Output 1070 ml  Net -1070 ml     Physical Exam    Physical Exam: GENERAL: No acute distress. HEENT: normal  NECK: Supple, JVP 5-6   .  2+ bilaterally, no bruits.  No lymphadenopathy or thyromegaly appreciated.   CARDIAC:  Mechanical heart sounds with LVAD hum present.  LUNGS:  Clear to auscultation bilaterally.  ABDOMEN:  Soft, round, nontender, positive bowel sounds x4.     LVAD exit site: Dressing dry and intact.  No erythema or drainage.  Stabilization device present and accurately applied.  Driveline dressing is being changed daily per sterile technique. EXTREMITIES:  Warm and dry, no cyanosis, clubbing, rash or edema  NEUROLOGIC:  Alert and oriented x 3.    No aphasia.  No dysarthria.  Affect pleasant.           Telemetry   NSR   Labs   Basic Metabolic Panel: Recent Labs  Lab 03/15/20 1538 03/15/20 1538 03/17/20 0405 03/17/20 0405 03/18/20 0044 03/19/20 0027 03/20/20 0017  NA 132*  --  130*  --  132* 132* 134*  K 3.3*  --  3.4*  --  3.7 3.7 3.5  CL 98  --  97*  --  100 99 99  CO2 21*  --  22  --  22 24 26   GLUCOSE 91   --  91  --  72 109* 126*  BUN 12  --  15  --  10 8 9   CREATININE 1.07  --  1.21  --  0.84 0.78 0.84  CALCIUM 8.9   < > 8.7*   < > 8.6* 8.7* 8.8*  MG 1.8  --   --   --   --   --   --   PHOS 3.3  --   --   --   --   --   --    < > = values in this interval not displayed.    Liver Function Tests: Recent Labs  Lab 03/15/20 1128 03/15/20 1538  AST 20 20  ALT 12 13  ALKPHOS 138* 134*  BILITOT 2.3* 1.7*  PROT 9.1* 9.0*  ALBUMIN 3.9 3.9   Recent Labs  Lab 03/15/20 1128 03/17/20 0405  LIPASE 367* 51   No results for input(s): AMMONIA in the last 168 hours.  CBC: Recent Labs  Lab 03/16/20 0123 03/17/20 0405 03/18/20 0044 03/19/20 0027 03/20/20 0017  WBC 23.2* 28.6* 17.5* 9.0 7.4  NEUTROABS  --   --  13.5* 6.3 4.8  HGB 13.6  12.7* 11.4* 11.0* 11.5*  HCT 42.6 40.6 36.1* 34.9* 36.5*  MCV 84.4 85.3 85.1 85.1 83.7  PLT 182 167 176 204 249    INR: Recent Labs  Lab 03/16/20 0123 03/17/20 0405 03/18/20 0044 03/19/20 0027 03/20/20 0017  INR 2.5* 2.8* 2.7* 3.0* 2.5*    Other results:  EKG:    Imaging   No results found.   Medications:     Scheduled Medications: . cephALEXin  500 mg Oral TID  . docusate sodium  100 mg Oral BID  . DULoxetine  60 mg Oral Daily  . gabapentin  300 mg Oral TID  . multivitamin with minerals  1 tablet Oral Daily  . pantoprazole  40 mg Oral BID  . sildenafil  20 mg Oral TID  . spironolactone  12.5 mg Oral Daily  . Warfarin - Pharmacist Dosing Inpatient   Does not apply q1600    Infusions:   PRN Medications: acetaminophen, albuterol, hydrOXYzine, ondansetron (ZOFRAN) IV, oxyCODONE   Assessment/Plan:    1. Acute pancreatitis: Suspect due to ETOH abuse.  CT abdomen suggestive of pancreatitis, no evidence for necrosis.  He admits to drinking.  No gallstones seen. Hydrated with IV fluids.  Tolerating soft diet.  - Pain controlled with oxycodone.  - Continue oxycodone.  2. ETOH abuse: He understands that he needs to quit.   Prior withdrawal symptoms.  - CIWA protocol.  - Thiamine/folate.  3. Chronic systolic HF with prominent RV failure. EF 10% s/p HM-3 LVAD on 09/06/17.  Admitted 8/21 for cardiogenic shock with severe RV failure. Treated with milrinone and then weaned off. Diuresed 40 pounds. VAD speed increased, currently at 6500 rpm.  -   LVAD parameters stable. LDH remains stable.   -Volume status stable.  Diuretics have been on hold. Plan to restart tomorrow.  -   Continue sildenafil.  - INR goal 2-2.5. INR up to 2.5 today. Discussed with pharmacy.  4. RecurrentDL infection with subxiphoid abscess: s/p debridement and pectoral flap in 4/21.  Had recurrent superficial subxiphoid abscess. CT with posible fluid collection. Evaluated by TCTS And IR and found to be muscle flap with no drainable fluid collection 6/21  - WBC trending up 20>23>28> 17.5>9>7.4     - Continue suppressive Keflex.  - He is not a candidate for pump exchange given severe RV dysfunction and poor self-care 5. Depression: Continue meds.  6. HTN: higher today. 7. OSA: Sleep study with very severe OSA (AHI 69/hr).  Continues to refuse therapy. We have arranged f/u in sleep clinic several times but he has not been compliant 8. H/o GI bleed: 08/2018 EGD completed and showed gastritis and nonbleeding duodenal ulcers, colonoscopy with 5 polyps removed. Protonix was increased to BID. No recent GI bleeding.  - Off ASA, continue warfarin.   - No bleeding issues.  9. Tobacco Abuse: Continues to smoke 2-3 cigs/day. - reinforced need to quit  Ambulate. Home later today. Discussed with Dr Benjamine Mola. Pain medication per Dr Benjamine Mola.   I reviewed the LVAD parameters from today, and compared the results to the patient's prior recorded data.  No programming changes were made.  The LVAD is functioning within specified parameters.  The patient performs LVAD self-test daily.  LVAD interrogation was negative for any significant power changes, alarms or PI events/speed  drops.  LVAD equipment check completed and is in good working order.  Back-up equipment present.   LVAD education done on emergency procedures and precautions and reviewed exit site care.  Length of  Stay: 5  Amy Clegg, NP 03/20/2020, 8:28 AM  VAD Team --- VAD ISSUES ONLY--- Pager [phone] (7am - 7am)  Advanced Heart Failure Team  Pager [phone] (M-F; 7a - 4p)  Please contact [facility] for night-coverage after hours (4p -7a ) and weekends on [email or web address]  Patient seen and examined with the above-signed Advanced Practice Provider and/or Housestaff. I personally reviewed laboratory data, imaging studies and relevant notes. I independently examined the patient and formulated the important aspects of the plan. I have edited the note to reflect any of my changes or salient points. I have personally discussed the plan with the patient and/or family.  Tolerating full diet. Still complaining of generalized ab pain and requesting pain meds. Exam benign/ Labs improved.   General:  NAD.  HEENT: normal  Neck: supple. JVP not elevated.  Carotids 2+ bilat; no bruits. No lymphadenopathy or thryomegaly appreciated. Cor: LVAD hum.  Lungs: Clear. Abdomen: soft, nontender, non-distended. No hepatosplenomegaly. No bruits or masses. Good bowel sounds. Driveline site clean. Anchor in place.  Extremities: no cyanosis, clubbing, rash. Warm no edema  Neuro: alert & oriented x 3. No focal deficits. Moves all 4 without problem   Pancreatitis appears resolved. If he tolerates full lunch I think he can go home today. Would minimize pain meds so as not to cover up recurrent symptoms or promote narcotic ileus. HF status stable. INR ok. VAD interrogated personally. Parameters stable.  D/w Dr. Benjamine Mola.   Arvilla Meres, MD  9:31 AM

## 2020-03-20 NOTE — Discharge Summary (Addendum)
Advanced Heart Failure Team  Discharge Summary   Patient ID: Jonathan Wood MRN: [id number], DOB/AGE: [birth year] 47 y.o. Admit date: 03/15/2020 D/C date:     03/20/2020   Primary Discharge Diagnoses:  1. Acute pancreatitis 2. ETOH abuse 3. 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 RecurrentDL infection with subxiphoid abscess 5. Depression 6. HTN 7. OSA 8. H/o GI bleed 9. Tobacco Abuse  Hospital Course:  Jonathan Wood a 49 y.o.malewith a history of 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    LVAD issues Drive line infection 98/1191 Drive line infection 08/7827-->F/A pectoralis muscle flap. Continue life suppressive antibiotics with keflex.  Superficial abscess from muscle flap.   GI Issues 08/2018 -  EGD completed and showed gastritis and nonbleeding duodenal ulcers, colonoscopy with 5 polyps removed.  HF Issues 12/2019 Cardiogenic shock. Had RV failure and was placed on milrinone. Ramp ECHO completed and speed was increased to 6500.      Admitted with acute ETOH-related pancreatitis. TRH consulted for acute pancreatitis. Lipase elevated. CT abd/pelvis with  acute interstitial edematous pancreatitis without evidence of pancreatic necrosis or peripancreatic fluid collection. Received IV fluids and narcotics. Gradually improved and tolerated diet. Pain controlled at discharge.   See below for detailed d/c summary. He will continue to be followed closely in the VAD clinic.     1. Acute pancreatitis: Suspect due to ETOH abuse. CT abdomen suggestive of pancreatitis, no evidence for necrosis. He admits to drinking. No gallstones seen. Hydrated with IV fluids with diet gradually advanced.  - Triad Consulted for pancreatitis.   - Pain controlled with oxycodone.  - Narcotics gradually weaned and he will be discharged with a few days of oxycodone.  2. ETOH  abuse: He understands that he needs to quit. Prior withdrawal symptoms.  - CIWA protocol initiated.  - Thiamine/folate.  3. 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 Treated with milrinone and then weaned off. Diuresed 40 pounds. VAD speed increased, currently at 6500 rpm.  -  LVAD parameters stable. LDH remains stable.   -Volume status stable.  Diuretics have been on hold but he will restart tomorrow.  - No bb with RV failure.  - In the past he was on entresto but stopped due to AKI.  -   Continue sildenafil.  - INR goal 2-2.5. INR up to 2.5 today. Discussed with pharmacy.  -He is not aspirin with h/o GI bleed.  4. RecurrentDL infection with subxiphoid abscess:s/p debridement and pectoralflap in 4/21. Had recurrent superficial subxiphoid abscess. CT with posible fluid collection. Evaluated by TCTS And IR and found to be muscle flap with no drainable fluid collection 6/21  - WBC peaked at 28 but normalized at discharge.      -Continue suppressive Keflex. - He is not a candidate for pump exchange given severe RV dysfunction and poor self-care 5. Depression: Continue meds. 6. HTN: higher today. Re-evaluate at his follow up.  7. OSA: Sleep study with very severe OSA (AHI 69/hr). Continues to refuse therapy. We have arranged f/u in sleep clinic several times but he has not been compliant 8. H/o GI bleed:08/2018 EGD completed and showed gastritis and nonbleeding duodenal ulcers, colonoscopy with 5 polyps removed. Protonix was increased to BID. No recent GI bleeding. -Off ASA, continue warfarin.  - No bleeding issues.  -  Hemoglobin remains stable.  9. Tobacco Abuse: Continues to smoke 2-3 cigs/day. - reinforced need to quit    LVAD Interrogation HM III:   Speed:   6500  Flow:   5.9   PI: 3.3     Power:   6    Back-up speed:      Discharge Vitals: Blood pressure (!) 134/123, pulse  84, temperature 97.6 F (36.4 C), temperature source Oral, resp. rate 17, height 6\' 2"  (1.88 m), weight 88.7 kg, SpO2 94 %.  Labs: Lab Results  Component Value Date   WBC 7.4 03/20/2020   HGB 11.5 (L) 03/20/2020   HCT 36.5 (L) 03/20/2020   MCV 83.7 03/20/2020   PLT 249 03/20/2020    Recent Labs  Lab 03/15/20 1538 03/17/20 0405 03/20/20 0017  NA 132*   < > 134*  K 3.3*   < > 3.5  CL 98   < > 99  CO2 21*   < > 26  BUN 12   < > 9  CREATININE 1.07   < > 0.84  CALCIUM 8.9   < > 8.8*  PROT 9.0*  --   --   BILITOT 1.7*  --   --   ALKPHOS 134*  --   --   ALT 13  --   --   AST 20  --   --   GLUCOSE 91   < > 126*   < > = values in this interval not displayed.   Lab Results  Component Value Date   CHOL 111 08/30/2017   HDL 33 (L) 08/30/2017   LDLCALC 70 08/30/2017   TRIG 38 08/30/2017   BNP (last 3 results) Recent Labs    03/21/19 1415 04/23/19 1350  BNP 426.9* 709.6*    ProBNP (last 3 results) No results for input(s): PROBNP in the last 8760 hours.   Diagnostic Studies/Procedures   No results found.  Discharge Medications   Allergies as of 03/20/2020   No Known Allergies     Medication List    STOP taking these medications   amoxicillin 500 MG capsule Commonly known as: AMOXIL     TAKE these medications   acetaminophen 500 MG tablet Commonly known as: TYLENOL Take 1,500 mg by mouth 3 (three) times daily as needed for headache (pain).   albuterol 108 (90 Base) MCG/ACT inhaler Commonly known as: ProAir HFA Inhale 2 puffs into the lungs every 6 (six) hours as needed for wheezing or shortness of breath.   cephALEXin 500 MG capsule Commonly known as: KEFLEX Take 1 capsule (500 mg total) by mouth 3 (three) times daily.   docusate sodium 100 MG capsule Commonly known as: Stool Softener Take 1 capsule (100 mg total) by mouth 2 (two) times daily.   DULoxetine 60 MG capsule Commonly known as: CYMBALTA Take 1 capsule (60 mg total) by mouth daily.    gabapentin 300 MG capsule Commonly known as: NEURONTIN TAKE ONE CAPSULE BY MOUTH THREE TIMES DAILY   hydrOXYzine 25 MG tablet Commonly known as: ATARAX/VISTARIL Take 2 tablets (50 mg total) by mouth at bedtime as needed for anxiety. What changed: reasons to take this   linaclotide 145 MCG Caps capsule Commonly known as: LINZESS Take 145 mcg by mouth daily as needed (constipation).   oxyCODONE 5 MG immediate release tablet Commonly known as: Oxy IR/ROXICODONE Take 1 tablet (5 mg total) by mouth every 4 (four) hours as needed for up to 3 days for moderate pain.  pantoprazole 40 MG tablet Commonly known as: PROTONIX Take 1 tablet (40 mg total) by mouth 2 (two) times daily. What changed: when to take this   sildenafil 20 MG tablet Commonly known as: REVATIO Take 1 tablet (20 mg total) by mouth 3 (three) times daily.   spironolactone 25 MG tablet Commonly known as: ALDACTONE Take 0.5 tablets (12.5 mg total) by mouth daily.   torsemide 20 MG tablet Commonly known as: DEMADEX Take 1 tablet (20 mg total) by mouth every other day. Start taking on: March 21, 2020 What changed: when to take this   warfarin 5 MG tablet Commonly known as: COUMADIN Take as directed. If you are unsure how to take this medication, talk to your nurse or doctor. Original instructions: Take 5 mg (1 tab) every Monday, Wed, and Friday and 2.5 mg (0.5 tab) all other days or as directed by HF Clinic. What changed:   how much to take  how to take this  when to take this  additional instructions       Disposition   The patient will be discharged in stable condition to home. Discharge Instructions    Diet - low sodium heart healthy   Complete by: As directed    INR  Goal: 2 - 2.5   Complete by: As directed    Goal: 2 - 2.5   Increase activity slowly   Complete by: As directed    Page VAD Coordinator at [phone]  Notify for: any VAD alarms, sustained elevations of power >10 watts,  sustained drop in Pulse Index <3   Complete by: As directed    Notify for:  any VAD alarms sustained elevations of power >10 watts sustained drop in Pulse Index <3     Speed Settings:   Complete by: As directed    Fixed 6500 RPM Low 6200 RPM      Follow-up Information    Bensimhon, Bevelyn Buckles, MD Follow up in 1 week(s).   Specialty: Cardiology Contact information: [address] [place] [address] [phone]                 Duration of Discharge Encounter: Greater than 35 minutes   Signed, Tonye Becket NP-C  03/20/2020, 10:33 AM

## 2020-03-20 NOTE — Progress Notes (Signed)
LVAD Coordinator Rounding Note:  Admitted 03/15/20 to Dr. Conception Chancy due to abdominal pain from non-nectrotizing acute pancreatitis.   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 Maren Beach under Destination Therapy criteria.  Pt laying in bed this morning. Does report some abdominal soreness. He is excited about going home today. Follow up appointment made in VAD clinic for next week.   Vital signs: Temp: 97.6 HR: 80 Doppler Pressure: 110 Automatic BP:  O2 Sat: 94% RA Wt:  195.9>194.8>197.7>199.9>195.5 lbs   LVAD interrogation reveals:  Speed: 6500 Flow: 6.0 Power: 5.9 w PI: 2.9 Alarms: none Events: 4 PI Hematocrit: 36  Fixed speed: 6500 Low speed limit: 6000  Drive Line: Existing VAD dressing clean, dry, intact. Anchor x 2 intact. Weekly dressing changes per [facility] nurse; next dressing change due 03/26/20.  Labs:  LDH trend: 246>197>198>193>204>184  INR trend: 2.2>2.5>2.8>2.7>3.0>2.5  WBC trend: 21.0>23.2>28.6>17.5>9.0>7.4  Anticoagulation Plan: -INR Goal: 2.0 - 2.5 -ASA Dose: none  Device:  N/A  Plan/Recommendations:  1. Call VAD pager for VAD equipment or drive line issues. 2. Weekly dressing changes per BS nurse. 3. For discharge home today. Follow up in VAD clinic scheduled for 03/26/20 at 11:00.    Alyce Pagan RN VAD Coordinator  Office: [phone]  24/7 Pager: [phone]

## 2020-03-20 NOTE — Plan of Care (Signed)
  Problem: Health Behavior/Discharge Planning: Goal: Ability to manage health-related needs will improve Outcome: Progressing   Problem: Clinical Measurements: Goal: Ability to maintain clinical measurements within normal limits will improve Outcome: Progressing Goal: Will remain free from infection Outcome: Progressing Goal: Diagnostic test results will improve Outcome: Progressing Goal: Respiratory complications will improve Outcome: Progressing Goal: Cardiovascular complication will be avoided Outcome: Progressing   Problem: Activity: Goal: Risk for activity intolerance will decrease Outcome: Progressing   Problem: Nutrition: Goal: Adequate nutrition will be maintained Outcome: Progressing   Problem: Coping: Goal: Level of anxiety will decrease Outcome: Progressing   Problem: Elimination: Goal: Will not experience complications related to bowel motility Outcome: Progressing Goal: Will not experience complications related to urinary retention Outcome: Progressing   Problem: Pain Managment: Goal: General experience of comfort will improve Outcome: Progressing   Problem: Safety: Goal: Ability to remain free from injury will improve Outcome: Progressing   Problem: Skin Integrity: Goal: Risk for impaired skin integrity will decrease Outcome: Progressing   Problem: Education: Goal: Knowledge of cardiac device and self-care will improve Outcome: Progressing Goal: Ability to safely manage health related needs after discharge will improve Outcome: Progressing Goal: Individualized Educational Video(s) Outcome: Progressing   Problem: Cardiac: Goal: Ability to achieve and maintain adequate cardiopulmonary perfusion will improve Outcome: Progressing   

## 2020-03-21 ENCOUNTER — Telehealth: Payer: Self-pay

## 2020-03-21 NOTE — Telephone Encounter (Signed)
Transition Care Management Unsuccessful Follow-up Telephone Call  Date of discharge and from where:  03/20/2020 [facility]  Attempts:  1st Attempt  Reason for unsuccessful TCM follow-up call:  Unable to reach patient

## 2020-03-24 NOTE — Telephone Encounter (Signed)
Transition Care Management Unsuccessful Follow-up Telephone Call  Date of discharge and from where:  03/20/2020 [facility] [place]  Attempts:  2nd Attempt  Reason for unsuccessful TCM follow-up call:  Unable to reach patient

## 2020-03-25 ENCOUNTER — Other Ambulatory Visit (HOSPITAL_COMMUNITY): Payer: Self-pay | Admitting: *Deleted

## 2020-03-25 ENCOUNTER — Telehealth: Payer: Self-pay

## 2020-03-25 DIAGNOSIS — Z95811 Presence of heart assist device: Secondary | ICD-10-CM

## 2020-03-25 DIAGNOSIS — Z7901 Long term (current) use of anticoagulants: Secondary | ICD-10-CM

## 2020-03-25 NOTE — Telephone Encounter (Signed)
Transition Care Management Unsuccessful Follow-up Telephone Call  Date of discharge and from where:  11/4/2021[facility] [place]  Attempts:  3rd Attempt  Reason for unsuccessful TCM follow-up call:  Unable to leave message

## 2020-03-26 ENCOUNTER — Ambulatory Visit (HOSPITAL_COMMUNITY)
Admission: RE | Admit: 2020-03-26 | Discharge: 2020-03-26 | Disposition: A | Discharge status: Home / Self Care | Payer: Medicare Other | Source: Ambulatory Visit | Attending: Internal Medicine | Admitting: Internal Medicine

## 2020-03-26 ENCOUNTER — Encounter (HOSPITAL_COMMUNITY): Payer: Self-pay

## 2020-03-26 ENCOUNTER — Ambulatory Visit (HOSPITAL_COMMUNITY): Payer: Self-pay | Admitting: Pharmacist

## 2020-03-26 ENCOUNTER — Other Ambulatory Visit: Payer: Self-pay

## 2020-03-26 VITALS — BP 113/91 | Temp 97.9°F | Wt 195.8 lb

## 2020-03-26 DIAGNOSIS — Z95811 Presence of heart assist device: Secondary | ICD-10-CM

## 2020-03-26 DIAGNOSIS — I10 Essential (primary) hypertension: Secondary | ICD-10-CM

## 2020-03-26 DIAGNOSIS — K859 Acute pancreatitis without necrosis or infection, unspecified: Secondary | ICD-10-CM | POA: Insufficient documentation

## 2020-03-26 DIAGNOSIS — Z79899 Other long term (current) drug therapy: Secondary | ICD-10-CM | POA: Diagnosis not present

## 2020-03-26 DIAGNOSIS — Z7901 Long term (current) use of anticoagulants: Secondary | ICD-10-CM | POA: Diagnosis not present

## 2020-03-26 DIAGNOSIS — G4733 Obstructive sleep apnea (adult) (pediatric): Secondary | ICD-10-CM | POA: Insufficient documentation

## 2020-03-26 DIAGNOSIS — Z8249 Family history of ischemic heart disease and other diseases of the circulatory system: Secondary | ICD-10-CM | POA: Diagnosis not present

## 2020-03-26 DIAGNOSIS — I5022 Chronic systolic (congestive) heart failure: Secondary | ICD-10-CM | POA: Diagnosis not present

## 2020-03-26 DIAGNOSIS — F329 Major depressive disorder, single episode, unspecified: Secondary | ICD-10-CM | POA: Diagnosis not present

## 2020-03-26 DIAGNOSIS — Z87891 Personal history of nicotine dependence: Secondary | ICD-10-CM | POA: Diagnosis not present

## 2020-03-26 DIAGNOSIS — I110 Hypertensive heart disease with heart failure: Secondary | ICD-10-CM | POA: Insufficient documentation

## 2020-03-26 DIAGNOSIS — Z8711 Personal history of peptic ulcer disease: Secondary | ICD-10-CM | POA: Insufficient documentation

## 2020-03-26 DIAGNOSIS — T827XXA Infection and inflammatory reaction due to other cardiac and vascular devices, implants and grafts, initial encounter: Secondary | ICD-10-CM

## 2020-03-26 DIAGNOSIS — K852 Alcohol induced acute pancreatitis without necrosis or infection: Secondary | ICD-10-CM

## 2020-03-26 LAB — CBC
HCT: 40.8 % (ref 39.0–52.0)
Hemoglobin: 12.8 g/dL — ABNORMAL LOW (ref 13.0–17.0)
MCH: 26.4 pg (ref 26.0–34.0)
MCHC: 31.4 g/dL (ref 30.0–36.0)
MCV: 84.1 fL (ref 80.0–100.0)
Platelets: 277 10*3/uL (ref 150–400)
RBC: 4.85 MIL/uL (ref 4.22–5.81)
RDW: 17.5 % — ABNORMAL HIGH (ref 11.5–15.5)
WBC: 6.5 10*3/uL (ref 4.0–10.5)
nRBC: 0 % (ref 0.0–0.2)

## 2020-03-26 LAB — COMPREHENSIVE METABOLIC PANEL
ALT: 22 U/L (ref 0–44)
AST: 36 U/L (ref 15–41)
Albumin: 3.3 g/dL — ABNORMAL LOW (ref 3.5–5.0)
Alkaline Phosphatase: 128 U/L — ABNORMAL HIGH (ref 38–126)
Anion gap: 10 (ref 5–15)
BUN: 6 mg/dL (ref 6–20)
CO2: 21 mmol/L — ABNORMAL LOW (ref 22–32)
Calcium: 9.3 mg/dL (ref 8.9–10.3)
Chloride: 106 mmol/L (ref 98–111)
Creatinine, Ser: 1.04 mg/dL (ref 0.61–1.24)
GFR, Estimated: >60 mL/min (ref >60)
Glucose, Bld: 114 mg/dL — ABNORMAL HIGH (ref 70–99)
Potassium: 3.7 mmol/L (ref 3.5–5.1)
Sodium: 137 mmol/L (ref 135–145)
Total Bilirubin: 1 mg/dL (ref 0.3–1.2)
Total Protein: 7.6 g/dL (ref 6.5–8.1)

## 2020-03-26 LAB — PROTIME-INR
INR: 1.9 — ABNORMAL HIGH (ref 0.8–1.2)
Prothrombin Time: 21.3 seconds — ABNORMAL HIGH (ref 11.4–15.2)

## 2020-03-26 LAB — LACTATE DEHYDROGENASE: LDH: 210 U/L — ABNORMAL HIGH (ref 98–192)

## 2020-03-26 MED ORDER — SPIRONOLACTONE 25 MG PO TABS: 12.5000 mg | ORAL_TABLET | Freq: Every day | ORAL | 3 refills | Status: DC

## 2020-03-26 MED ORDER — ENTRESTO 24-26 MG PO TABS: 1.0000 | ORAL_TABLET | Freq: Two times a day (BID) | ORAL | 4 refills | Status: DC

## 2020-03-26 MED ORDER — WARFARIN SODIUM 5 MG PO TABS: ORAL_TABLET | ORAL | 3 refills | Status: DC

## 2020-03-26 NOTE — Progress Notes (Signed)
LVAD INR 

## 2020-03-26 NOTE — Progress Notes (Signed)
Patient presents for hospital d/c f/u in VAD clinic today alone. Denies any issues with his VAD or alarms.   Pt reports he has been feeling "great" since hospital discharge. Denies abdominal pain. Denies ETOH use. Smoking 3-5 cigarettes daily.   He states he has not been able to pick up Spironolactone since discharge due to an issue with his pharmacy. Refill sent in today per patient request. BP markedly elevated today. See below. Per Dr [name], will start Entresto 24-26 mg BID. Will continue Cleda Daub and Torsemide for now, but pt instructed he can stop Torsemide if he feels like he is losing a lot of fluid. Provided with 2 sample packs of Entresto. See separate note documented by Jeannie Fend RN.   Reports he lost two of his batteries. Thinks he may have left them at the hospital, but he is unsure. Provided with 2 replacement batteries- [id number] & [id number] exp dates 06/04/19.   Vital Signs: Temp: 97.9 Doppler Pressure: 140 Automatc BP: 132/107 (116) HR: 84 SPO2: 96 % RA  Weight: 195.8 lbs w/ eqt Hospital d/c wt: 195.5 lbs w/o equip   VAD Indication: Destination Therapy   VAD interrogation & Equipment Management: Speed: 6500 Flow: 5.6 Power: 5.8 w    PI: 3.9 Hct: 37  Alarms: few low voltage. 1 no external power- 11/7; accidental double disconnect.  Events: 10 - 30  Primary Controller:  Replace back up battery in 27 months Back up controller:   Replace back up battery in 7  months  I reviewed the LVAD parameters from today and compared the results to the patient's prior recorded data.  LVAD interrogation was NEGATIVE for significant power changes, POSITIVE for clinical alarms and STABLE for PI events/speed drops. No programming changes were made and pump is functioning within specified parameters.   Exit Site Care: Drive line is being maintained weekly by VAD coordinators. Existing VAD dressing removed and site care performed using sterile technique. Drive line exit site cleaned  with Chlora prep applicators x 2, allowed to dry, and Sorbaview dressing with biopatch reapplied. Anchor reapplied. Drive line exit site incorporated, the velour is fully implanted at exit site. No drainage, erythema, tenderness, or foul odor noted.  Pt denies fever or chills. Continue weekly dressing changes. VAD coordinators to perform weekly dressing changes. Reinforced to mother and pt if dressing is coming off prior to dressing change appointment it MUST be changed. Patient provided with 4 weekly kits; instructed to bring to clinic weekly for dressing changes.   Device:none  BP & Labs:  Doppler 140 - reflecting modified systolic  Hgb 12.8 - No S/S of bleeding. Specifically denies melena/BRBPR or nosebleeds.  LDH 210 - established baseline of 200-300. Denies tea-colored urine. No power elevations noted on interrogation.   Patient Instructions:  1. Start Entresto 24-26 mg twice daily 2. Coumadin dosing per Leotis Shames PharmD  3. Return to VAD clinic in 1 week for labs, BP check, and dressing change 4. Return to VAD clinic in 2 months for full follow up visit  Alyce Pagan RN VAD Coordinator  Office: [phone]  24/7 Pager: [phone]

## 2020-03-26 NOTE — Patient Instructions (Signed)
1. Start Entresto 24-26 mg twice daily 2. Coumadin dosing per Leotis Shames PharmD  3. Return to VAD clinic in 1 week for labs, BP check, and dressing change 4. Return to VAD clinic in 2 months for full follow up visit

## 2020-03-26 NOTE — Progress Notes (Signed)
Medication Samples have been provided to the patient.  Drug name: Sherryll Burger       Strength: 24/26mg         Qty: 2  LOT: [id number], [id number]  Exp.Date: 10/22, 5/23  Dosing instructions: Take 1 tab Twice daily   The patient has been instructed regarding the correct time, dose, and frequency of taking this medication, including desired effects and most common side effects.   Jhaden Pizzuto 11:52 AM 03/26/2020

## 2020-03-27 ENCOUNTER — Other Ambulatory Visit (HOSPITAL_COMMUNITY): Payer: Self-pay | Admitting: Unknown Physician Specialty

## 2020-03-27 DIAGNOSIS — Z95811 Presence of heart assist device: Secondary | ICD-10-CM

## 2020-03-27 DIAGNOSIS — Z7901 Long term (current) use of anticoagulants: Secondary | ICD-10-CM

## 2020-03-27 NOTE — Addendum Note (Signed)
Encounter addended by: Dolores Patty, MD on: 03/27/2020 9:41 AM  Actions taken: Clinical Note Signed, Level of Service modified, Visit diagnoses modified, Charge Capture section accepted

## 2020-03-27 NOTE — Progress Notes (Signed)
VAD Clinic Note   Date:  03/27/2020   ID:  Jonathan Wood, DOB [birth year], MRN [id number]  Location: Home  Provider location: [facility] [place] [facility] Type of Visit: Established patient  PCP: [name]  Cardiologist:  Arvilla Meres, MD Primary HF: Dr [name]   Chief Complaint: Heart Failure/LVAD  History of Present Illness:  Jonathan Wood is a 49 y.o. 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  Admitted 08/21/2018 with volume overload and anemia. Diuresed with IV lasix and transitioned to torsemide 20 mg daily. GI consulted EGD completed and showed gastritis and nonbleeding duodenal ulcers, colonoscopy with 5 polyps removed. Protonix increased to BID, he was started on PO iron, and hydrocortisone suppositories for hemorrhoid treatment. He received a total of 2 uPRBCs. He will stay off ASA for several weeks but can reconsider later.  Discharge weight was 221 pounds.   Admitted 12/7-15/20 for DL infection with overlying cellulitis and volume overload. Treated with IV abx and diuresis.   Recently admitted 08/16/19-09/13/19 for DL infection with large subxiphoid abscess. Underwent several debridements and eventually a pec muscle flap. Wound cx + MSSA. Initially started on ancef and rifabutin but expanded to cefipime for a week then changed to IV ancef and po rifabutin. Now on oral keflex and rifabutin  Readmitted 11/13/19-11/16/19 with superficial abscess and tachypnea. Superficial abscess I&D'd and packed. CT scan done and showed possible deeper fluid collection. Reviewed with TCTS who felt it was muscle flap. Sent to IR for possible drainage but u/s confirmed it was the muscle flap and no drainable fluid collection. Wound cx negative. ID saw and recommended continuing Keflex.   Admitted 8/12-22/21 with lactic acidosis due to cardiogenic shock. Started on 0.25 of milrinone  and NE. Ramp echo on 8/13 with severe RV dysfunction. Speed increased 5600->6300. Tolerated ok. Repeat ramp echo on 8/20 speed increased to 6500. Diuresed over 40 pounds.   Admitted 03/15/20 with acute ETOH pancreatitis. Gallbladder ok. Resolved with conservative management. Feels much better. Says he is no longer drinking. Still smoking 3-5 cigs/day. Appetite good. No ab pain. Did not pick up spiro on d/c from hospital. Taking torsemide qod. No edema, orthopnea or PND. Denies orthopnea or PND. No fevers, chills or problems with driveline. No bleeding, melena or neuro symptoms. No VAD alarms. Taking all meds as prescribed.    VAD Indication: Destination Therapy   VAD interrogation & Equipment Management: Speed: 6500 Flow: 5.6 Power: 5.8 w PI: 3.9 Hct: 37  Alarms: few low voltage. 1 no external power- 11/7; accidental double disconnect.  Events: 10 - 30  Primary Controller: Replace back up battery in 27 months Back up controller: Replace back up battery in 21months    Past Medical History:  Diagnosis Date  . Asthma   . CHF (congestive heart failure) (HCC)    a. 09/2016: EF 20-25% with cath showing normal cors  . GERD (gastroesophageal reflux disease)   . History of hiatal hernia   . LVAD (left ventricular assist device) present (HCC)   . OSA on CPAP 09/06/2018   Severe OSA with AHI 68/hr on CPAP at 12cm H2O   Past Surgical History:  Procedure Laterality Date  . APPLICATION OF A-CELL OF CHEST/ABDOMEN N/A 08/24/2019   Procedure: Application Of A-Cell Of Chest/Abdomen;  Surgeon: Kerin Perna, MD;  Location: [facility] OR;  Service: Thoracic;  Laterality: N/A;  .  APPLICATION OF A-CELL OF CHEST/ABDOMEN N/A 09/05/2019   Procedure: Application Of A-Cell Of Chest/Abdomen;  Surgeon: Peggye Form, DO;  Location: [facility] OR;  Service: Plastics;  Laterality: N/A;  . APPLICATION OF A-CELL OF CHEST/ABDOMEN  09/03/2019   Procedure: Application Of A-Cell Of Chest/Abdomen;  Surgeon: Kerin Perna, MD;  Location: [facility] OR;  Service: Thoracic;;  . APPLICATION OF WOUND VAC N/A 08/22/2019   Procedure: Debridement, Irrigation and Packing of Abdominal Incision.;  Surgeon: Kerin Perna, MD;  Location: [facility] OR;  Service: Thoracic;  Laterality: N/A;  . APPLICATION OF WOUND VAC N/A 08/24/2019   Procedure: Irrigation and Debridement with WOUND VAC APPLICATION;  Surgeon: Kerin Perna, MD;  Location: [facility] OR;  Service: Thoracic;  Laterality: N/A;  . APPLICATION OF WOUND VAC N/A 08/30/2019   Procedure: APPLICATION OF WOUND VAC;  Surgeon: Kerin Perna, MD;  Location: [facility] OR;  Service: Thoracic;  Laterality: N/A;  . APPLICATION OF WOUND VAC N/A 09/03/2019   Procedure: WOUND VAC CHANGE;  Surgeon: Kerin Perna, MD;  Location: [facility] OR;  Service: Thoracic;  Laterality: N/A;  . BIOPSY  08/23/2018   Procedure: BIOPSY;  Surgeon: Lemar Lofty., MD;  Location: [facility] ENDOSCOPY;  Service: Gastroenterology;;  . COLONOSCOPY N/A 08/23/2018   Procedure: COLONOSCOPY;  Surgeon: Lemar Lofty., MD;  Location: [facility] ENDOSCOPY;  Service: Gastroenterology;  Laterality: N/A;  . ENTEROSCOPY N/A 08/23/2018   Procedure: ENTEROSCOPY;  Surgeon: Meridee Score Netty Starring., MD;  Location: [facility] ENDOSCOPY;  Service: Gastroenterology;  Laterality: N/A;  . HEMOSTASIS CLIP PLACEMENT  08/23/2018   Procedure: HEMOSTASIS CLIP PLACEMENT;  Surgeon: Lemar Lofty., MD;  Location: [facility] ENDOSCOPY;  Service: Gastroenterology;;  . IABP INSERTION N/A 09/05/2017   Procedure: IABP INSERTION;  Surgeon: Dolores Patty, MD;  [facility]: [facility] INVASIVE CV LAB;  Service: Cardiovascular;  Laterality: N/A;  . INSERTION OF IMPLANTABLE LEFT VENTRICULAR ASSIST DEVICE N/A 09/06/2017   Procedure: INSERTION OF IMPLANTABLE LEFT VENTRICULAR ASSIST DEVICE - HM3;  Surgeon: Kerin Perna, MD;  Location: [facility] OR;  Service: Open Heart Surgery;  Laterality: N/A;  HM3  . MUSCLE FLAP CLOSURE N/A 09/05/2019   Procedure: MUSCLE FLAP CLOSURE;  Surgeon:  Peggye Form, DO;  Location: [facility] OR;  Service: Plastics;  Laterality: N/A;  . NASAL FRACTURE SURGERY  1987  . POLYPECTOMY  08/23/2018   Procedure: POLYPECTOMY;  Surgeon: Mansouraty, Netty Starring., MD;  Location: [facility] ENDOSCOPY;  Service: Gastroenterology;;  . RIGHT HEART CATH N/A 08/30/2017   Procedure: RIGHT HEART CATH;  Surgeon: Dolores Patty, MD;  Location: [facility] INVASIVE CV LAB;  Service: Cardiovascular;  Laterality: N/A;  . RIGHT/LEFT HEART CATH AND CORONARY ANGIOGRAPHY N/A 10/08/2016   Procedure: Right/Left Heart Cath and Coronary Angiography;  Surgeon: Orpah Cobb, MD;  [facility]: [facility] INVASIVE CV LAB;  Service: Cardiovascular;  Laterality: N/A;  . STERNAL WOUND DEBRIDEMENT N/A 08/20/2019   Procedure: DEBRIDEMENT OF LVAD DRIVELINE TUNNEL;  Surgeon: Kerin Perna, MD;  Location: [facility] OR;  Service: Thoracic;  Laterality: N/A;  . STERNAL WOUND DEBRIDEMENT N/A 09/05/2019   Procedure: DEBRIDEMENT AND CLOSURE OF ABDOMINAL WOUND;  Surgeon: Peggye Form, DO;  Location: [facility] OR;  Service: Plastics;  Laterality: N/A;  . SUBMUCOSAL TATTOO INJECTION  08/23/2018   Procedure: SUBMUCOSAL TATTOO INJECTION;  Surgeon: Lemar Lofty., MD;  Location: [facility] ENDOSCOPY;  Service: Gastroenterology;;  . TEE WITHOUT CARDIOVERSION N/A 09/06/2017   Procedure: TRANSESOPHAGEAL ECHOCARDIOGRAM (TEE);  Surgeon: Donata Clay, Theron Arista, MD;  Location: [facility] OR;  Service: Open Heart Surgery;  Laterality: N/A;  . WOUND DEBRIDEMENT N/A 08/30/2019   Procedure: Debridement Abdominal Wound;  Surgeon: Kerin Perna, MD;  Location: [facility] OR;  Service: Thoracic;  Laterality: N/A;     Current Outpatient Medications  Medication Sig Dispense Refill  . acetaminophen (TYLENOL) 500 MG tablet Take 1,500 mg by mouth 3 (three) times daily as needed for headache (pain).    Marland Kitchen albuterol (PROAIR HFA) 108 (90 Base) MCG/ACT inhaler Inhale 2 puffs into the lungs every 6 (six) hours as needed for wheezing or shortness of breath. 18 g 3  .  cephALEXin (KEFLEX) 500 MG capsule Take 1 capsule (500 mg total) by mouth 3 (three) times daily. 90 capsule 11  . DULoxetine (CYMBALTA) 60 MG capsule Take 1 capsule (60 mg total) by mouth daily. 30 capsule 3  . gabapentin (NEURONTIN) 300 MG capsule TAKE ONE CAPSULE BY MOUTH THREE TIMES DAILY (Patient taking differently: Take 300 mg by mouth 3 (three) times daily. ) 90 capsule 6  . hydrOXYzine (ATARAX/VISTARIL) 25 MG tablet Take 2 tablets (50 mg total) by mouth at bedtime as needed for anxiety. (Patient taking differently: Take 50 mg by mouth at bedtime as needed (sleep). ) 60 tablet 11  . pantoprazole (PROTONIX) 40 MG tablet Take 1 tablet (40 mg total) by mouth 2 (two) times daily. (Patient taking differently: Take 40 mg by mouth daily. ) 30 tablet 6  . sildenafil (REVATIO) 20 MG tablet Take 1 tablet (20 mg total) by mouth 3 (three) times daily. 90 tablet 11  . torsemide (DEMADEX) 20 MG tablet Take 1 tablet (20 mg total) by mouth every other day. 90 tablet 3  . docusate sodium (STOOL SOFTENER) 100 MG capsule Take 1 capsule (100 mg total) by mouth 2 (two) times daily. (Patient not taking: Reported on 03/15/2020) 60 capsule 11  . linaclotide (LINZESS) 145 MCG CAPS capsule Take 145 mcg by mouth daily as needed (constipation). (Patient not taking: Reported on 03/26/2020)    . sacubitril-valsartan (ENTRESTO) 24-26 MG Take 1 tablet by mouth 2 (two) times daily. 60 tablet 4  . spironolactone (ALDACTONE) 25 MG tablet Take 0.5 tablets (12.5 mg total) by mouth daily. 30 tablet 3  . warfarin (COUMADIN) 5 MG tablet Take 5 mg (1 tab) every Monday, Wed, and Friday and 2.5 mg (0.5 tab) all other days or as directed by HF Clinic. 180 tablet 3   No current facility-administered medications for this encounter.    Allergies:   Patient has no known allergies.   Social History:  The patient  reports that he has quit smoking. His smoking use included cigarettes. He has a 12.50 pack-year smoking history. He has never  used smokeless tobacco. He reports previous alcohol use. He reports previous drug use. Drug: Marijuana.   Family History:  The patient's family history includes Alcohol abuse in his cousin; Hypertension in his father.   ROS:  Please see the history of present illness.   All other systems are personally reviewed and negative.     Vital Signs: Temp: 97.9 Doppler Pressure: 140 Automatc BP: 132/107 (116) HR: 84 SPO2: 96 % RA  Weight: 195.8 lbs w/ eqt Hospital d/c wt: 195.5 lbs w/o equip    General:  NAD.  HEENT: normal  Neck: supple. JVP not elevated.  Carotids 2+ bilat; no bruits. No lymphadenopathy or thryomegaly appreciated. Cor: LVAD hum.  Lungs: Clear. Abdomen: soft, nontender, non-distended. No hepatosplenomegaly. No bruits or masses. Good bowel sounds. Driveline site clean.  Anchor in place.  Extremities: no cyanosis, clubbing, rash. Warm no edema  Neuro: alert & oriented x 3. No focal deficits. Moves all 4 without problem    Recent Labs: 04/23/2019: B Natriuretic Peptide 709.6 08/13/2019: TSH 0.209 03/15/2020: Magnesium 1.8 03/26/2020: ALT 22; BUN 6; Creatinine, Ser 1.04; Hemoglobin 12.8; Platelets 277; Potassium 3.7; Sodium 137    Wt Readings from Last 3 Encounters:  03/26/20 88.8 kg (195 lb 12.8 oz)  03/20/20 88.7 kg (195 lb 8.8 oz)  03/12/20 94.6 kg (208 lb 9.6 oz)      ASSESSMENT AND PLAN:  1. 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 Treated with milrinone and then weaned off.  Diuresed 40 pounds. VAD speed increased 5600 -> 6300 - He looks great from HF perspective today. Volume status looks good.  - Continue torsemide 20 qod. Can adjust as needed  2. Acute ETOH pancreatitis, 11/21 - resolved.  - reports he has quit drinking (was stressed about his son) - we offered him support  3. Recurrent DL infection with subxiphoid abscess - s/p debridement and  pec flap in 4/21 - remains on keflex - had recurrent superficial subxiphoid abscess. CT with posible fluid collection. Evaluated by TCTS And IR and found to be muscle flap with no draiable fluid collection 6/21  - Site looks fine. Continue Keflex - He is not a candidate for pump exchange given severe RV dysfunction and poor self-care  4. HM3 LVAD 09/06/2017.  - VAD interrogated personally. Parameters stable. - LDH 210 - INR 1.9 goal 2.0-2.5  Discussed dosing with PharmD personally. - Off ASA with PUD.  - hgb 12.8  5. Depression - Improved today - Following with psych.  - No change.  6.  Essential HTN - MAPs up - He has been off spiro. Will restart  - Had AKI with Entresto in past. Wil restart at low dose and follow closely   7. OSA - sleep study with very severe OSA (AHI 69/hr) - continues to refuse therapy. We have arranged f/u in sleep clinic several times but he has not been compliant - He has lost weight and have offered retesting but he refuses  8. H/o GI bleed  - 08/2018 EGD completed and showed gastritis and nonbleeding duodenal ulcers, colonoscopy with 5 polyps removed. Protonix was increased to BID.  - No recent GI bleeding - hgb 12.8 MCV 84 - He will stay off aspirin for now.   8. Tobacco Abuse  - continues to smoke 3-5 cigs/day. - reinforced need to quit  9. Elevated bilirubin and low pre-albumin - suspected related to ETOH use and RV failure +/- fatty liver - Bili 1.0 today (improved)  Total time spent 35 minutes. Over half that time spent discussing above.   Signed, Arvilla Meres, MD  03/27/2020 9:33 AM  [facility] [address] [facility] [place] [address] [phone] (office) [phone] (fax)

## 2020-04-02 ENCOUNTER — Ambulatory Visit (HOSPITAL_COMMUNITY)
Admission: RE | Admit: 2020-04-02 | Discharge: 2020-04-02 | Disposition: A | Discharge status: Home / Self Care | Payer: Medicare Other | Source: Ambulatory Visit | Attending: Internal Medicine | Admitting: Internal Medicine

## 2020-04-02 ENCOUNTER — Ambulatory Visit (HOSPITAL_COMMUNITY): Payer: Self-pay | Admitting: Pharmacist

## 2020-04-02 ENCOUNTER — Other Ambulatory Visit: Payer: Self-pay

## 2020-04-02 DIAGNOSIS — Z7901 Long term (current) use of anticoagulants: Secondary | ICD-10-CM | POA: Diagnosis not present

## 2020-04-02 DIAGNOSIS — Z4801 Encounter for change or removal of surgical wound dressing: Secondary | ICD-10-CM | POA: Insufficient documentation

## 2020-04-02 DIAGNOSIS — Z95811 Presence of heart assist device: Secondary | ICD-10-CM | POA: Insufficient documentation

## 2020-04-02 LAB — BASIC METABOLIC PANEL
Anion gap: 9 (ref 5–15)
BUN: 7 mg/dL (ref 6–20)
CO2: 23 mmol/L (ref 22–32)
Calcium: 9.2 mg/dL (ref 8.9–10.3)
Chloride: 106 mmol/L (ref 98–111)
Creatinine, Ser: 0.93 mg/dL (ref 0.61–1.24)
GFR, Estimated: >60 mL/min (ref >60)
Glucose, Bld: 119 mg/dL — ABNORMAL HIGH (ref 70–99)
Potassium: 3.6 mmol/L (ref 3.5–5.1)
Sodium: 138 mmol/L (ref 135–145)

## 2020-04-02 LAB — PROTIME-INR
INR: 2.3 — ABNORMAL HIGH (ref 0.8–1.2)
Prothrombin Time: 24.7 seconds — ABNORMAL HIGH (ref 11.4–15.2)

## 2020-04-02 NOTE — Progress Notes (Signed)
LVAD INR 

## 2020-04-02 NOTE — Progress Notes (Signed)
Patient presents for dressing change and BP check  in VAD clinic today alone. Denies any issues with his VAD or alarms.   Pt reports he has been feeling "great" since discharge. Denies abdominal pain. Denies ETOH use. Smoking 3-5 cigarettes daily.   Pt reports that he has not been taking Entresto twice daily consistently and did not take his Spironolactone today. BP is slightly elevated but since pt has not been taking medications correctly we are unable to make changes to his medications today. We will recheck his BP next week.  Pt reports that he has taken Torsemide 2-3 times since last week.  Pt states that he will be in [place] for thanksgiving. Pt was provided with the following VAD centers in [place] in case of an emergency:  [facility] [address] [place], [place] [address] [place]  [phone]  [facility] [address] [place], [place] [address] [place]  [phone]   Vital Signs: Automatc BP: 112/102(106) HR: 84 SPO2: 96 % RA  Weight: 195.8 lbs w/ eqt Hospital d/c wt: 195.5 lbs w/o equip   Exit Site Care: Drive line is being maintained weekly by VAD coordinators. Existing VAD dressing removed and site care performed using sterile technique. Drive line exit site cleaned with Chlora prep applicators x 2, allowed to dry, and Sorbaview dressing with biopatch reapplied. Anchor reapplied. Drive line exit site incorporated, the velour is fully implanted at exit site. No drainage, erythema, tenderness, or foul odor noted.  Pt denies fever or chills. Continue weekly dressing changes. VAD coordinators to perform weekly dressing changes. Patient has sufficient dressing kits at home.   Device:none   Patient Instructions:  1. Return to VAD clinic in 1 week for labs, BP check, and dressing change  Carlton Adam RN VAD Coordinator  Office: [phone]  24/7 Pager: [phone]

## 2020-04-03 ENCOUNTER — Other Ambulatory Visit (HOSPITAL_COMMUNITY): Payer: Self-pay | Admitting: Unknown Physician Specialty

## 2020-04-03 DIAGNOSIS — Z7901 Long term (current) use of anticoagulants: Secondary | ICD-10-CM

## 2020-04-03 DIAGNOSIS — Z95811 Presence of heart assist device: Secondary | ICD-10-CM

## 2020-04-04 ENCOUNTER — Institutional Professional Consult (permissible substitution): Payer: Medicare Other | Admitting: Internal Medicine

## 2020-04-04 NOTE — Progress Notes (Deleted)
Jonathan Wood, male    DOB: Sep 24, 1970   MRN: [id number]   Brief patient profile:    Patient ID: Jonathan Wood MRN: [id number], DOB/AGE: 47-Jun-1972 47 y.o. Admit date: 03/15/2020 D/C date:     03/20/2020   Primary Discharge Diagnoses:  1. Acute pancreatitis 2. ETOH abuse 3. 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 RecurrentDL infection with subxiphoid abscess 5. Depression 6. HTN 7. OSA 8. H/o GI bleed 9. Tobacco Abuse  Hospital Course:  Jonathan Wood R49 y.o.malewith a history of 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  LVAD issues Drive line infection 63/8453 Drive line infection 10/4678-->H/O pectoralis muscle flap. Continue life suppressive antibiotics with keflex.  Superficial abscess from muscle flap.   GI Issues 08/2018 -  EGD completed and showed gastritis and nonbleeding duodenal ulcers, colonoscopy with 5 polyps removed.  HF Issues 12/2019 Cardiogenic shock. Had RV failure and was placed on milrinone. Ramp ECHO completed and speed was increased to 6500.      Admitted with acute ETOH-related pancreatitis.TRH consulted for acute pancreatitis. Lipase elevated. CT abd/pelvis with acute interstitial edematous pancreatitis without evidence of pancreatic necrosis or peripancreatic fluid collection. Received IV fluids and narcotics. Gradually improved and tolerated diet. Pain controlled at discharge.   See below for detailed d/c summary. He will continue to be followed closely in the VAD clinic.     1. Acute pancreatitis: Suspect due to ETOH abuse. CT abdomen suggestive of pancreatitis, no evidence for necrosis. He admits to drinking. No gallstones seen. Hydrated with IV fluids with diet gradually advanced.  - [facility] Consulted for pancreatitis.   - Pain controlled with oxycodone.  - Narcotics gradually weaned and he will  be discharged with a few days of oxycodone.  2. ETOH abuse: He understands that he needs to quit. Prior withdrawal symptoms.  - CIWA protocol initiated.  - Thiamine/folate.  3. 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 Treated with milrinone and then weaned off. Diuresed 40 pounds. VAD speed increased, currently at 6500 rpm.  - LVAD parameters stable.LDH remains stable. -Volume status stable.Diuretics have been on hold but he will restart tomorrow.  - No bb with RV failure.  - In the past he was on entresto but stopped due to AKI.  - Continue sildenafil.  - INR goal 2-2.5. INR up to2.5today. Discussed with pharmacy.  -He is not aspirin with h/o GI bleed.  4. RecurrentDL infection with subxiphoid abscess:s/p debridement and pectoralflap in 4/21. Had recurrent superficial subxiphoid abscess. CT with posible fluid collection. Evaluated by [facility] IR and found to be muscle flap with no drainable fluid collection 6/21  - WBC peaked at 28 but normalized at discharge.  -Continue suppressive Keflex. - He is not a candidate for pump exchange given severe RV dysfunction and poor self-care 5. Depression: Continue meds. 6. ZYY:QMGNOI today. Re-evaluate at his follow up.  7. OSA: Sleep study with very severe OSA (AHI 69/hr). Continues to refuse therapy. We have arranged f/u in sleep clinic several times but he has not been compliant 8. H/o GI bleed:08/2018 EGD completed and showed gastritis and nonbleeding duodenal ulcers, colonoscopy with 5 polyps removed. Protonix was increased to BID. No recent GI bleeding. -Off ASA, continue warfarin.  - No bleeding issues.  - Hemoglobin remains stable.  9.  Tobacco Abuse: Continues to smoke 2-3 cigs/day. - reinforced need to quit       History of Present Illness  04/04/2020  Pulmonary/ 1st office eval/ Jonathan Wood / [place] [facility]  No  chief complaint on file.    Dyspnea:  *** Cough: *** Sleep: *** SABA use:   Past Medical History:  Diagnosis Date  . Asthma   . CHF (congestive heart failure) (HCC)    a. 09/2016: EF 20-25% with cath showing normal cors  . GERD (gastroesophageal reflux disease)   . History of hiatal hernia   . LVAD (left ventricular assist device) present (HCC)   . OSA on CPAP 09/06/2018   Severe OSA with AHI 68/hr on CPAP at 12cm H2O    Outpatient Medications Prior to Visit  Medication Sig Dispense Refill  . acetaminophen (TYLENOL) 500 MG tablet Take 1,500 mg by mouth 3 (three) times daily as needed for headache (pain).    Marland Kitchen albuterol (PROAIR HFA) 108 (90 Base) MCG/ACT inhaler Inhale 2 puffs into the lungs every 6 (six) hours as needed for wheezing or shortness of breath. 18 g 3  . cephALEXin (KEFLEX) 500 MG capsule Take 1 capsule (500 mg total) by mouth 3 (three) times daily. 90 capsule 11  . docusate sodium (STOOL SOFTENER) 100 MG capsule Take 1 capsule (100 mg total) by mouth 2 (two) times daily. (Patient not taking: Reported on 03/15/2020) 60 capsule 11  . DULoxetine (CYMBALTA) 60 MG capsule Take 1 capsule (60 mg total) by mouth daily. 30 capsule 3  . gabapentin (NEURONTIN) 300 MG capsule TAKE ONE CAPSULE BY MOUTH THREE TIMES DAILY (Patient taking differently: Take 300 mg by mouth 3 (three) times daily. ) 90 capsule 6  . hydrOXYzine (ATARAX/VISTARIL) 25 MG tablet Take 2 tablets (50 mg total) by mouth at bedtime as needed for anxiety. (Patient taking differently: Take 50 mg by mouth at bedtime as needed (sleep). ) 60 tablet 11  . linaclotide (LINZESS) 145 MCG CAPS capsule Take 145 mcg by mouth daily as needed (constipation). (Patient not taking: Reported on 03/26/2020)    . pantoprazole (PROTONIX) 40 MG tablet Take 1 tablet (40 mg total) by mouth 2 (two) times daily. (Patient taking differently: Take 40 mg by mouth daily. ) 30 tablet 6  . sacubitril-valsartan (ENTRESTO) 24-26 MG Take 1 tablet by  mouth 2 (two) times daily. 60 tablet 4  . sildenafil (REVATIO) 20 MG tablet Take 1 tablet (20 mg total) by mouth 3 (three) times daily. 90 tablet 11  . spironolactone (ALDACTONE) 25 MG tablet Take 0.5 tablets (12.5 mg total) by mouth daily. 30 tablet 3  . torsemide (DEMADEX) 20 MG tablet Take 1 tablet (20 mg total) by mouth every other day. 90 tablet 3  . warfarin (COUMADIN) 5 MG tablet Take 5 mg (1 tab) every Monday, Wed, and Friday and 2.5 mg (0.5 tab) all other days or as directed by HF Clinic. 180 tablet 3   No facility-administered medications prior to visit.     Objective:     There were no vitals taken for this visit.         Assessment   No problem-specific Assessment & Plan notes found for this encounter.     Sandrea Hughs, MD 04/04/2020

## 2020-04-08 ENCOUNTER — Other Ambulatory Visit (HOSPITAL_COMMUNITY): Payer: Medicare Other

## 2020-04-09 ENCOUNTER — Other Ambulatory Visit (HOSPITAL_COMMUNITY): Payer: Medicare Other

## 2020-04-14 ENCOUNTER — Other Ambulatory Visit (HOSPITAL_COMMUNITY): Payer: Medicare Other

## 2020-04-16 ENCOUNTER — Ambulatory Visit (HOSPITAL_COMMUNITY)
Admission: RE | Admit: 2020-04-16 | Discharge: 2020-04-16 | Disposition: A | Discharge status: Home / Self Care | Payer: Medicare Other | Source: Ambulatory Visit | Attending: Internal Medicine | Admitting: Internal Medicine

## 2020-04-16 ENCOUNTER — Other Ambulatory Visit: Payer: Self-pay

## 2020-04-16 ENCOUNTER — Ambulatory Visit (HOSPITAL_COMMUNITY): Payer: Self-pay | Admitting: Pharmacist

## 2020-04-16 DIAGNOSIS — Z4801 Encounter for change or removal of surgical wound dressing: Secondary | ICD-10-CM | POA: Diagnosis not present

## 2020-04-16 DIAGNOSIS — Z95811 Presence of heart assist device: Secondary | ICD-10-CM | POA: Insufficient documentation

## 2020-04-16 DIAGNOSIS — Z7901 Long term (current) use of anticoagulants: Secondary | ICD-10-CM | POA: Insufficient documentation

## 2020-04-16 LAB — PROTIME-INR
INR: 1.7 — ABNORMAL HIGH (ref 0.8–1.2)
Prothrombin Time: 19.7 seconds — ABNORMAL HIGH (ref 11.4–15.2)

## 2020-04-16 MED ORDER — SACUBITRIL-VALSARTAN 49-51 MG PO TABS
1.0000 | ORAL_TABLET | Freq: Two times a day (BID) | ORAL | 3 refills | Status: DC
Start: 2020-04-16 — End: 2020-11-26

## 2020-04-16 NOTE — Patient Instructions (Signed)
1. Increase Entresto to 49-51 mg - take 1 pill twice a day 2. Return to clinic next week for BP check, dressing change and labs

## 2020-04-16 NOTE — Progress Notes (Signed)
Patient presents for dressing change and BP check  in VAD clinic today alone. Denies any issues with his VAD or alarms.   Pt reports he has been feeling "great" recently. He reports that he is taking his Cleda Daub every day and his Entresto twice daily. BP is still mildly elevated today. D/w Dr. [name] we will increase Sherryll Burger today.  pts weight is up today, but pt states that he thinks this is not fluid weight but food. Pt states that he has been eating very well recently. Spoke with pt about maintaining weight and not gaining anymore.  Vital Signs: Automatc BP: 119/104(111) HR: 84 SPO2: 96 % RA  Weight: 209.8 lbs w/ eqt Hospital d/c wt: 195.8 lbs w/o equip   Exit Site Care: Drive line is being maintained weekly by VAD coordinators. Existing VAD dressing removed and site care performed using sterile technique. Drive line exit site cleaned with Chlora prep applicators x 2, allowed to dry, and Sorbaview dressing with biopatch reapplied. Anchor reapplied. Drive line exit site incorporated, the velour is fully implanted at exit site. No drainage, erythema, tenderness, or foul odor noted.  Pt denies fever or chills. Continue weekly dressing changes. VAD coordinators to perform weekly dressing changes. Patient has sufficient dressing kits at home.   Device:none   Patient Instructions:  1. Increase Entresto to 49-51 mg - take 1 pill twice a day 2. Return to clinic next week for BP check, dressing change and labs  Carlton Adam RN VAD Coordinator  Office: [phone]  24/7 Pager: [phone]

## 2020-04-16 NOTE — Progress Notes (Signed)
LVAD INR 

## 2020-04-16 NOTE — Progress Notes (Signed)
CSW met with patient in the clinic for follow up. Patient states he is doing well and recently was approved for Medicare. Patient denies any concerns at the moment and did a review of the SDOH wheel with no interventions needed at this time. CSW continues to follow for supportive needs. [name], [place], [place]

## 2020-04-18 ENCOUNTER — Other Ambulatory Visit (HOSPITAL_COMMUNITY): Payer: Self-pay | Admitting: Unknown Physician Specialty

## 2020-04-18 ENCOUNTER — Telehealth (HOSPITAL_COMMUNITY): Payer: Self-pay | Admitting: Pharmacist

## 2020-04-18 DIAGNOSIS — Z7901 Long term (current) use of anticoagulants: Secondary | ICD-10-CM

## 2020-04-18 DIAGNOSIS — Z95811 Presence of heart assist device: Secondary | ICD-10-CM

## 2020-04-18 NOTE — Telephone Encounter (Signed)
Advanced Heart Failure Patient Advocate Encounter  Prior Authorization for Sherryll Burger has been approved.    PA# [id number] Effective dates: 03/19/2020 - 04/18/2021  [name], PharmD, BCPS, BCCP, CPP Heart Failure Clinic Pharmacist [phone]

## 2020-04-23 ENCOUNTER — Ambulatory Visit (HOSPITAL_COMMUNITY)
Admission: RE | Admit: 2020-04-23 | Discharge: 2020-04-23 | Disposition: A | Discharge status: Home / Self Care | Payer: Medicare Other | Source: Ambulatory Visit | Attending: Cardiology | Admitting: Cardiology

## 2020-04-23 ENCOUNTER — Other Ambulatory Visit: Payer: Self-pay

## 2020-04-23 ENCOUNTER — Other Ambulatory Visit (HOSPITAL_COMMUNITY): Payer: Self-pay | Admitting: *Deleted

## 2020-04-23 ENCOUNTER — Ambulatory Visit (HOSPITAL_COMMUNITY): Payer: Self-pay | Admitting: Pharmacist

## 2020-04-23 DIAGNOSIS — Z95811 Presence of heart assist device: Secondary | ICD-10-CM

## 2020-04-23 DIAGNOSIS — F17210 Nicotine dependence, cigarettes, uncomplicated: Secondary | ICD-10-CM | POA: Insufficient documentation

## 2020-04-23 DIAGNOSIS — Z5181 Encounter for therapeutic drug level monitoring: Secondary | ICD-10-CM | POA: Diagnosis not present

## 2020-04-23 DIAGNOSIS — Z7901 Long term (current) use of anticoagulants: Secondary | ICD-10-CM | POA: Diagnosis not present

## 2020-04-23 LAB — HEPATIC FUNCTION PANEL
ALT: 21 U/L (ref 0–44)
AST: 25 U/L (ref 15–41)
Albumin: 3.4 g/dL — ABNORMAL LOW (ref 3.5–5.0)
Alkaline Phosphatase: 146 U/L — ABNORMAL HIGH (ref 38–126)
Bilirubin, Direct: 0.2 mg/dL (ref 0.0–0.2)
Indirect Bilirubin: 0.8 mg/dL (ref 0.3–0.9)
Total Bilirubin: 1 mg/dL (ref 0.3–1.2)
Total Protein: 8 g/dL (ref 6.5–8.1)

## 2020-04-23 LAB — LIPASE, BLOOD: Lipase: 26 U/L (ref 11–51)

## 2020-04-23 LAB — BASIC METABOLIC PANEL
Anion gap: 10 (ref 5–15)
BUN: 7 mg/dL (ref 6–20)
CO2: 22 mmol/L (ref 22–32)
Calcium: 9.1 mg/dL (ref 8.9–10.3)
Chloride: 104 mmol/L (ref 98–111)
Creatinine, Ser: 0.82 mg/dL (ref 0.61–1.24)
GFR, Estimated: >60 mL/min (ref >60)
Glucose, Bld: 114 mg/dL — ABNORMAL HIGH (ref 70–99)
Potassium: 3.9 mmol/L (ref 3.5–5.1)
Sodium: 136 mmol/L (ref 135–145)

## 2020-04-23 LAB — CBC
HCT: 39.7 % (ref 39.0–52.0)
Hemoglobin: 12.8 g/dL — ABNORMAL LOW (ref 13.0–17.0)
MCH: 27.5 pg (ref 26.0–34.0)
MCHC: 32.2 g/dL (ref 30.0–36.0)
MCV: 85.4 fL (ref 80.0–100.0)
Platelets: 232 10*3/uL (ref 150–400)
RBC: 4.65 MIL/uL (ref 4.22–5.81)
RDW: 15.9 % — ABNORMAL HIGH (ref 11.5–15.5)
WBC: 7.2 10*3/uL (ref 4.0–10.5)
nRBC: 0 % (ref 0.0–0.2)

## 2020-04-23 LAB — PROTIME-INR
INR: 2 — ABNORMAL HIGH (ref 0.8–1.2)
Prothrombin Time: 22 seconds — ABNORMAL HIGH (ref 11.4–15.2)

## 2020-04-23 LAB — AMYLASE: Amylase: 65 U/L (ref 28–100)

## 2020-04-23 NOTE — Progress Notes (Signed)
LVAD INR 

## 2020-04-23 NOTE — Patient Instructions (Addendum)
1. No medication changes today 2. Coumadin dosing per Leotis Shames PharmD 3. Return to clinic in 1 week for INR & dressing change 4. Below is the closest VAD center to [place], [place]:  [facility]   [address]  [place] , [place], [address], [place]  Phone: [phone] 5. Drink plenty of fluid!!

## 2020-04-23 NOTE — Progress Notes (Addendum)
Patient presents for hospital d/c f/u in VAD clinic today alone. Denies any issues with his VAD or alarms.   Pt reports he has been feeling "pretty good" other than intermittent abdominal pain. He paged VAD coordinator over the weekend for constant 8/10 abdominal pain (he thinks it may be recurrent pancreatitis.) Denies ETOH use. Instructed to go to ER over the weekend for evaluation, but patient did not want to go due to "childcare issues" and stated "it isn't that bad." Today reports pain has improved, stating that the pain is now "intermittently 8/10; nothing as bad as the last time." Palpated abdomen- soft to touch. Reports tenderness over umbilical area and RLQ "when being mashed on", but pain stops when no longer palpating. Denies fever or chill. Afebrile today. Lipase, amylase, and hepatic function labs obtained today per Dr [name].   Smoking 6 cigarettes daily.   Reports he has been taking his Entresto as prescribed. BP improved today (see below).   Scheduled for dental extractions 05/13/20. Will discuss Coumadin dosing with Lauren PharmD and Dr [name].   Vital Signs: Temp: 98.6 Doppler Pressure: 110 Automatc BP: 108/65 (79) HR: 93 SR SPO2: 100 % RA  Weight: 204.4 lbs w/ eqt Last wt: 209.8 lbs w/ equip   VAD Indication: Destination Therapy   VAD interrogation & Equipment Management: Speed: 6500 Flow: 6.3 Power: 6.1 w    PI: 2.7 Hct: 37  Alarms: few low voltage Events: 60 + daily   Primary Controller:  Replace back up battery in 26 months Back up controller:   Replace back up battery in 7  months  I reviewed the LVAD parameters from today and compared the results to the patient's prior recorded data.  LVAD interrogation was NEGATIVE for significant power changes, POSITIVE for clinical alarms and STABLE for PI events/speed drops. No programming changes were made and pump is functioning within specified parameters.   Exit Site Care: Drive line is being maintained  weekly by VAD coordinators. Existing VAD dressing removed and site care performed using sterile technique. Drive line exit site cleaned with Chlora prep applicators x 2, allowed to dry, and Sorbaview dressing with biopatch reapplied. Anchor reapplied x 2. Drive line exit site incorporated, the velour is fully implanted at exit site. No drainage, erythema, tenderness, or foul odor noted.  Pt denies fever or chills. Continue weekly dressing changes. VAD coordinators to perform weekly dressing changes. Reinforced to pt if dressing is coming off prior to dressing change appointment it MUST be changed. Patient provided with 4 weekly kits; instructed to bring to clinic weekly for dressing changes.   Device:none  BP & Labs:  Doppler 110 - reflecting modified systolic  Hgb 12.8 - No S/S of bleeding. Specifically denies melena/BRBPR or nosebleeds.  LDH not drawn today  - established baseline of 200-300. Denies tea-colored urine. No power elevations noted on interrogation.  Lipase- 26 Amylase- 65  Patient Instructions:  1. No medication changes today 2. Coumadin dosing per Leotis Shames PharmD 3. Return to clinic in 1 week for INR & dressing change 4. Below is the closest VAD center to [place], [place]:  [facility]   [address]  [place] , [place], [address], [place]  Phone: [phone] 5. Make sure you drink plenty of fluid!!    Alyce Pagan RN VAD Coordinator  Office: [phone]  24/7 Pager: [phone]

## 2020-04-24 ENCOUNTER — Encounter (HOSPITAL_COMMUNITY): Payer: Medicare Other

## 2020-04-25 ENCOUNTER — Other Ambulatory Visit (HOSPITAL_COMMUNITY): Payer: Self-pay | Admitting: Unknown Physician Specialty

## 2020-04-25 DIAGNOSIS — Z7901 Long term (current) use of anticoagulants: Secondary | ICD-10-CM

## 2020-04-25 DIAGNOSIS — Z95811 Presence of heart assist device: Secondary | ICD-10-CM

## 2020-04-30 ENCOUNTER — Other Ambulatory Visit: Payer: Self-pay

## 2020-04-30 ENCOUNTER — Ambulatory Visit (HOSPITAL_COMMUNITY)
Admission: RE | Admit: 2020-04-30 | Discharge: 2020-04-30 | Disposition: A | Discharge status: Home / Self Care | Payer: Medicare Other | Source: Ambulatory Visit | Attending: Internal Medicine | Admitting: Internal Medicine

## 2020-04-30 ENCOUNTER — Ambulatory Visit (HOSPITAL_COMMUNITY): Payer: Self-pay | Admitting: Pharmacist

## 2020-04-30 DIAGNOSIS — Z4801 Encounter for change or removal of surgical wound dressing: Secondary | ICD-10-CM | POA: Insufficient documentation

## 2020-04-30 DIAGNOSIS — I10 Essential (primary) hypertension: Secondary | ICD-10-CM

## 2020-04-30 DIAGNOSIS — Z7901 Long term (current) use of anticoagulants: Secondary | ICD-10-CM | POA: Diagnosis not present

## 2020-04-30 DIAGNOSIS — Z95811 Presence of heart assist device: Secondary | ICD-10-CM | POA: Insufficient documentation

## 2020-04-30 LAB — PROTIME-INR
INR: 1.4 — ABNORMAL HIGH (ref 0.8–1.2)
Prothrombin Time: 17.1 seconds — ABNORMAL HIGH (ref 11.4–15.2)

## 2020-04-30 MED ORDER — DULOXETINE HCL 60 MG PO CPEP
60.0000 mg | ORAL_CAPSULE | Freq: Every day | ORAL | 6 refills | Status: DC
Start: 2020-04-30 — End: 2021-07-15

## 2020-04-30 MED ORDER — SPIRONOLACTONE 25 MG PO TABS: 12.5000 mg | ORAL_TABLET | Freq: Every day | ORAL | 6 refills | Status: DC

## 2020-04-30 MED ORDER — WARFARIN SODIUM 5 MG PO TABS: ORAL_TABLET | ORAL | 3 refills | Status: DC

## 2020-04-30 NOTE — Progress Notes (Signed)
Patient presents for dressing change in VAD clinic today alone. Denies any issues with his VAD or alarms.   Exit Site Care: Drive line is being maintained weekly by VAD coordinators. Existing VAD dressing removed and site care performed using sterile technique. Drive line exit site cleaned with Chlora prep applicators x 2, and rinsed with saline, allowed to dry, and Sorbaview dressing with biopatch reapplied. Anchor reapplied. Drive line exit site incorporated, the velour is fully implanted at exit site. No drainage, erythema, tenderness, or foul odor noted.  Pt denies fever or chills. Continue weekly dressing changes. VAD coordinators to perform weekly dressing changes. Patient has sufficient dressing kits at home.   Return to clinic in 1 week, Lauren will call you about results of INR and Coumadin dosing.  Carlton Adam RN VAD Coordinator  Office: [phone]  24/7 Pager: [phone]

## 2020-04-30 NOTE — Progress Notes (Signed)
LVAD INR 

## 2020-05-02 ENCOUNTER — Other Ambulatory Visit (HOSPITAL_COMMUNITY): Payer: Self-pay | Admitting: *Deleted

## 2020-05-02 DIAGNOSIS — Z95811 Presence of heart assist device: Secondary | ICD-10-CM

## 2020-05-02 DIAGNOSIS — Z7901 Long term (current) use of anticoagulants: Secondary | ICD-10-CM

## 2020-05-07 ENCOUNTER — Ambulatory Visit (HOSPITAL_COMMUNITY): Payer: Self-pay | Admitting: Pharmacist

## 2020-05-07 ENCOUNTER — Other Ambulatory Visit (HOSPITAL_COMMUNITY): Payer: Self-pay | Admitting: Unknown Physician Specialty

## 2020-05-07 ENCOUNTER — Ambulatory Visit (HOSPITAL_COMMUNITY)
Admission: RE | Admit: 2020-05-07 | Discharge: 2020-05-07 | Disposition: A | Discharge status: Home / Self Care | Payer: Medicare Other | Source: Ambulatory Visit | Attending: Internal Medicine | Admitting: Internal Medicine

## 2020-05-07 ENCOUNTER — Other Ambulatory Visit: Payer: Self-pay

## 2020-05-07 DIAGNOSIS — Z7901 Long term (current) use of anticoagulants: Secondary | ICD-10-CM

## 2020-05-07 DIAGNOSIS — Z4801 Encounter for change or removal of surgical wound dressing: Secondary | ICD-10-CM | POA: Diagnosis present

## 2020-05-07 DIAGNOSIS — Z95811 Presence of heart assist device: Secondary | ICD-10-CM

## 2020-05-07 LAB — PROTIME-INR
INR: 1.3 — ABNORMAL HIGH (ref 0.8–1.2)
Prothrombin Time: 16 seconds — ABNORMAL HIGH (ref 11.4–15.2)

## 2020-05-07 MED ORDER — NICOTINE 21 MG/24HR TD PT24
21.0000 mg | MEDICATED_PATCH | Freq: Every day | TRANSDERMAL | 3 refills | Status: DC
Start: 2020-05-07 — End: 2023-01-07

## 2020-05-07 NOTE — Progress Notes (Signed)
LVAD INR 

## 2020-05-07 NOTE — Progress Notes (Addendum)
Patient presents for dressing change in VAD clinic today alone. Denies any issues with his VAD or alarms.   Exit Site Care: Drive line is being maintained weekly by VAD coordinators. Existing VAD dressing removed and site care performed using sterile technique. Drive line exit site cleaned with Chlora prep applicators x 2, and rinsed with saline, allowed to dry, and Sorbaview dressing with biopatch reapplied. Anchor reapplied. Drive line exit site incorporated, the velour is fully implanted at exit site. No drainage, erythema, tenderness, or foul odor noted.  Pt denies fever or chills. Continue weekly dressing changes. VAD coordinators to perform weekly dressing changes. Patient has sufficient dressing kits at home.   Pt states that he is having teeth extractions on 12/28 at the office of Dr. [name] in [place] [phone]. I have scheduled the pt for next Monday for dressing change/INR check prior to his procedure on Tuesday. I spoke with DR. Best team who states that Dr. [name] would like the pts INR goal between 2-3. The pt will have local anesthetic only for these extractions. Dr. [name] has already called in 2,000 mg of Amoxcillin for the pt pre procedure. I provided our pager number to DR. Best office.  Again, the pt expresses that he would like to quit smoking so that he might be considered for transplant and is requesting a nicotine patch. The pt states that he has quit using ETOH altogether. I sent a nicotine script to his local pharmacy.   Called the pt and left him a message with instructions for coumadin. To include taking lovenox twice a day for the next 2 days and 7.5 mg tonight. Then to resume previous coumadin dosing with a recheck on Monday prior to his dental appt on Tuesday per Lauren-pharm D.  Return to clinic in 1 week  Carlton Adam RN VAD Coordinator  Office: [phone]  24/7 Pager: [phone]

## 2020-05-08 ENCOUNTER — Other Ambulatory Visit (HOSPITAL_COMMUNITY): Payer: Self-pay | Admitting: Unknown Physician Specialty

## 2020-05-08 DIAGNOSIS — Z7901 Long term (current) use of anticoagulants: Secondary | ICD-10-CM

## 2020-05-08 DIAGNOSIS — Z95811 Presence of heart assist device: Secondary | ICD-10-CM

## 2020-05-08 NOTE — Addendum Note (Signed)
Encounter addended by: Lebron Quam, RN on: 05/08/2020 11:39 AM  Actions taken: Clinical Note Signed

## 2020-05-12 ENCOUNTER — Ambulatory Visit (HOSPITAL_COMMUNITY): Payer: Self-pay | Admitting: Pharmacist

## 2020-05-12 ENCOUNTER — Other Ambulatory Visit: Payer: Self-pay

## 2020-05-12 ENCOUNTER — Ambulatory Visit (HOSPITAL_COMMUNITY)
Admission: RE | Admit: 2020-05-12 | Discharge: 2020-05-12 | Disposition: A | Discharge status: Home / Self Care | Payer: Medicare Other | Source: Ambulatory Visit | Attending: Internal Medicine | Admitting: Internal Medicine

## 2020-05-12 DIAGNOSIS — Z4801 Encounter for change or removal of surgical wound dressing: Secondary | ICD-10-CM | POA: Diagnosis not present

## 2020-05-12 DIAGNOSIS — Z95811 Presence of heart assist device: Secondary | ICD-10-CM | POA: Diagnosis not present

## 2020-05-12 DIAGNOSIS — Z7901 Long term (current) use of anticoagulants: Secondary | ICD-10-CM | POA: Insufficient documentation

## 2020-05-12 LAB — PROTIME-INR
INR: 1.3 — ABNORMAL HIGH (ref 0.8–1.2)
Prothrombin Time: 16.1 seconds — ABNORMAL HIGH (ref 11.4–15.2)

## 2020-05-12 NOTE — Progress Notes (Signed)
LVAD INR 

## 2020-05-12 NOTE — Progress Notes (Signed)
Patient presents for dressing change in VAD clinic today alone. Denies any issues with his VAD or alarms.   Exit Site Care: Drive line is being maintained weekly by VAD coordinators. Existing VAD dressing removed and site care performed using sterile technique. Drive line exit site cleaned with Chlora prep applicators x 2, and rinsed with saline, allowed to dry, and Sorbaview dressing with biopatch reapplied. Anchor reapplied. Drive line exit site incorporated, the velour is fully implanted at exit site. No drainage, erythema, tenderness, or foul odor noted.  Pt denies fever or chills. Continue weekly dressing changes. VAD coordinators to perform weekly dressing changes. Patient has sufficient dressing kits at home.   Pt states that he is having teeth extractions tomorrow and will call his local pharmacy to check on prophylactic antibiotic as prescribed by Dr. [name]. If any issues, pt will contact Dr. [name]' office.   [name], PharmD will contact patient with INR results and warfarin dosing.   Return to clinic in 1 week  [name] RN VAD Coordinator  Office: [phone]  24/7 Pager: [phone]

## 2020-05-15 ENCOUNTER — Other Ambulatory Visit (HOSPITAL_COMMUNITY): Payer: Self-pay | Admitting: *Deleted

## 2020-05-15 DIAGNOSIS — Z7901 Long term (current) use of anticoagulants: Secondary | ICD-10-CM

## 2020-05-15 DIAGNOSIS — Z95811 Presence of heart assist device: Secondary | ICD-10-CM

## 2020-05-21 ENCOUNTER — Other Ambulatory Visit (HOSPITAL_COMMUNITY): Payer: Medicare Other

## 2020-05-21 ENCOUNTER — Other Ambulatory Visit (HOSPITAL_COMMUNITY): Payer: Self-pay | Admitting: *Deleted

## 2020-05-21 DIAGNOSIS — Z7901 Long term (current) use of anticoagulants: Secondary | ICD-10-CM

## 2020-05-21 DIAGNOSIS — Z95811 Presence of heart assist device: Secondary | ICD-10-CM

## 2020-05-22 ENCOUNTER — Other Ambulatory Visit: Payer: Self-pay

## 2020-05-22 ENCOUNTER — Ambulatory Visit (HOSPITAL_COMMUNITY)
Admission: RE | Admit: 2020-05-22 | Discharge: 2020-05-22 | Disposition: A | Discharge status: Home / Self Care | Payer: Medicare Other | Source: Ambulatory Visit | Attending: Cardiology | Admitting: Cardiology

## 2020-05-22 ENCOUNTER — Other Ambulatory Visit (HOSPITAL_COMMUNITY): Payer: Self-pay | Admitting: Unknown Physician Specialty

## 2020-05-22 ENCOUNTER — Ambulatory Visit (HOSPITAL_COMMUNITY): Payer: Self-pay | Admitting: Pharmacist

## 2020-05-22 DIAGNOSIS — Z4801 Encounter for change or removal of surgical wound dressing: Secondary | ICD-10-CM | POA: Insufficient documentation

## 2020-05-22 DIAGNOSIS — Z95811 Presence of heart assist device: Secondary | ICD-10-CM | POA: Insufficient documentation

## 2020-05-22 DIAGNOSIS — Z7901 Long term (current) use of anticoagulants: Secondary | ICD-10-CM

## 2020-05-22 LAB — PROTIME-INR
INR: 1.7 — ABNORMAL HIGH (ref 0.8–1.2)
Prothrombin Time: 19.1 seconds — ABNORMAL HIGH (ref 11.4–15.2)

## 2020-05-22 NOTE — Progress Notes (Signed)
LVAD INR 

## 2020-05-22 NOTE — Progress Notes (Signed)
Patient presents for dressing change in VAD clinic today alone. Denies any issues with his VAD or alarms.   Exit Site Care: Drive line is being maintained weekly by VAD coordinators. Existing VAD dressing removed and site care performed using sterile technique. Drive line exit site cleaned with Chlora prep applicators x 2, and rinsed with saline, allowed to dry, and Sorbaview dressing with biopatch reapplied. Anchor reapplied. Drive line exit site incorporated, the velour is fully implanted at exit site. No drainage, erythema, tenderness, or foul odor noted.  Pt denies fever or chills. Continue weekly dressing changes. VAD coordinators to perform weekly dressing changes. Patient has sufficient dressing kits at home.   Pt states that he is had his teeth extractions last week and has done very well.  [name], PharmD will contact patient with INR results and warfarin dosing.   Return to clinic in 1 week  Carlton Adam RN VAD Coordinator  Office: [phone]  24/7 Pager: [phone]

## 2020-05-28 ENCOUNTER — Other Ambulatory Visit: Payer: Self-pay

## 2020-05-28 ENCOUNTER — Ambulatory Visit (HOSPITAL_COMMUNITY): Payer: Self-pay | Admitting: Pharmacist

## 2020-05-28 ENCOUNTER — Ambulatory Visit (HOSPITAL_COMMUNITY)
Admission: RE | Admit: 2020-05-28 | Discharge: 2020-05-28 | Disposition: A | Discharge status: Home / Self Care | Payer: Medicare Other | Source: Ambulatory Visit | Attending: Internal Medicine | Admitting: Internal Medicine

## 2020-05-28 DIAGNOSIS — Z7901 Long term (current) use of anticoagulants: Secondary | ICD-10-CM | POA: Insufficient documentation

## 2020-05-28 DIAGNOSIS — Z95811 Presence of heart assist device: Secondary | ICD-10-CM | POA: Diagnosis not present

## 2020-05-28 LAB — PROTIME-INR
INR: 2.3 — ABNORMAL HIGH (ref 0.8–1.2)
Prothrombin Time: 24.5 seconds — ABNORMAL HIGH (ref 11.4–15.2)

## 2020-05-28 NOTE — Progress Notes (Signed)
LVAD INR 

## 2020-05-28 NOTE — Progress Notes (Signed)
Patient presents for dressing change in VAD clinic today alone. Denies any issues with his VAD or alarms.   Exit Site Care: Drive line is being maintained weekly by VAD coordinators. Existing VAD dressing removed and site care performed using sterile technique. Drive line exit site cleaned with Chlora prep applicators x 2, and rinsed with saline, allowed to dry, and Sorbaview dressing with biopatch reapplied. Anchor reapplied. Drive line exit site incorporated, the velour is fully implanted at exit site. No drainage, erythema, tenderness, or foul odor noted.  Pt denies fever or chills. Continue weekly dressing changes. VAD coordinators to perform weekly dressing changes. Patient has sufficient dressing kits at home.    [name], PharmD will contact patient with INR results and warfarin dosing.   Return to clinic in 1 week  Carlton Adam RN VAD Coordinator  Office: [phone]  24/7 Pager: [phone]

## 2020-06-02 ENCOUNTER — Encounter (HOSPITAL_COMMUNITY): Payer: Medicare Other

## 2020-06-03 ENCOUNTER — Other Ambulatory Visit (HOSPITAL_COMMUNITY): Payer: Self-pay | Admitting: *Deleted

## 2020-06-03 DIAGNOSIS — Z7901 Long term (current) use of anticoagulants: Secondary | ICD-10-CM

## 2020-06-03 DIAGNOSIS — Z95811 Presence of heart assist device: Secondary | ICD-10-CM

## 2020-06-04 ENCOUNTER — Encounter (HOSPITAL_COMMUNITY): Payer: Medicare Other

## 2020-06-05 ENCOUNTER — Other Ambulatory Visit (HOSPITAL_COMMUNITY): Payer: Medicare Other

## 2020-06-09 ENCOUNTER — Ambulatory Visit (HOSPITAL_COMMUNITY)
Admission: RE | Admit: 2020-06-09 | Discharge: 2020-06-09 | Disposition: A | Discharge status: Home / Self Care | Payer: Medicare Other | Source: Ambulatory Visit | Attending: Internal Medicine | Admitting: Internal Medicine

## 2020-06-09 ENCOUNTER — Ambulatory Visit (HOSPITAL_COMMUNITY): Payer: Self-pay | Admitting: Pharmacist

## 2020-06-09 ENCOUNTER — Other Ambulatory Visit: Payer: Self-pay

## 2020-06-09 DIAGNOSIS — Z95811 Presence of heart assist device: Secondary | ICD-10-CM | POA: Diagnosis present

## 2020-06-09 DIAGNOSIS — Z7901 Long term (current) use of anticoagulants: Secondary | ICD-10-CM | POA: Insufficient documentation

## 2020-06-09 LAB — PROTIME-INR
INR: 2.4 — ABNORMAL HIGH (ref 0.8–1.2)
Prothrombin Time: 25.5 seconds — ABNORMAL HIGH (ref 11.4–15.2)

## 2020-06-09 NOTE — Progress Notes (Signed)
LVAD INR 

## 2020-06-09 NOTE — Progress Notes (Signed)
Patient presents for dressing change in VAD clinic today alone. Denies any issues with his VAD or alarms.   Exit Site Care: Drive line is being maintained weekly by VAD coordinators. Existing VAD dressing removed and site care performed using sterile technique. Drive line exit site cleaned with Chlora prep applicators x 2, and rinsed with saline, allowed to dry, and Sorbaview dressing with biopatch reapplied. Anchor reapplied. Drive line exit site incorporated, the velour is fully implanted at exit site. No drainage, erythema, tenderness, or foul odor noted.  Pt denies fever or chills. Continue weekly dressing changes. VAD coordinators to perform weekly dressing changes. Patient has sufficient dressing kits at home.    Lauren Kemp, PharmD will contact patient with INR results and warfarin dosing.   Return to clinic in 1 week  Vang Kraeger RN VAD Coordinator  Office: [phone]  24/7 Pager: [phone]         

## 2020-06-16 ENCOUNTER — Other Ambulatory Visit (HOSPITAL_COMMUNITY): Payer: Self-pay | Admitting: Unknown Physician Specialty

## 2020-06-16 ENCOUNTER — Other Ambulatory Visit (HOSPITAL_COMMUNITY): Payer: Self-pay | Admitting: *Deleted

## 2020-06-16 DIAGNOSIS — Z7901 Long term (current) use of anticoagulants: Secondary | ICD-10-CM

## 2020-06-16 DIAGNOSIS — Z95811 Presence of heart assist device: Secondary | ICD-10-CM

## 2020-06-16 MED ORDER — ALBUTEROL SULFATE HFA 108 (90 BASE) MCG/ACT IN AERS: 2.0000 | INHALATION_SPRAY | Freq: Four times a day (QID) | RESPIRATORY_TRACT | 6 refills | Status: DC | PRN

## 2020-06-17 ENCOUNTER — Encounter (HOSPITAL_COMMUNITY): Payer: Medicare Other

## 2020-06-17 ENCOUNTER — Telehealth (HOSPITAL_COMMUNITY): Payer: Self-pay | Admitting: *Deleted

## 2020-06-17 ENCOUNTER — Other Ambulatory Visit (HOSPITAL_COMMUNITY): Payer: Medicare Other

## 2020-06-17 NOTE — Telephone Encounter (Signed)
Left VM for patient re: missed INR & dressing change appointment this morning. Requested call back to reschedule.  Alyce Pagan RN VAD Coordinator  Office: [phone]  24/7 Pager: [phone]

## 2020-06-18 ENCOUNTER — Other Ambulatory Visit: Payer: Self-pay

## 2020-06-18 ENCOUNTER — Ambulatory Visit (HOSPITAL_COMMUNITY)
Admission: RE | Admit: 2020-06-18 | Discharge: 2020-06-18 | Disposition: A | Discharge status: Home / Self Care | Payer: Medicare Other | Source: Ambulatory Visit | Attending: Internal Medicine | Admitting: Internal Medicine

## 2020-06-18 ENCOUNTER — Ambulatory Visit (HOSPITAL_COMMUNITY): Payer: Self-pay | Admitting: Pharmacist

## 2020-06-18 DIAGNOSIS — Z95811 Presence of heart assist device: Secondary | ICD-10-CM | POA: Insufficient documentation

## 2020-06-18 DIAGNOSIS — Z4801 Encounter for change or removal of surgical wound dressing: Secondary | ICD-10-CM | POA: Diagnosis not present

## 2020-06-18 DIAGNOSIS — Z7901 Long term (current) use of anticoagulants: Secondary | ICD-10-CM | POA: Diagnosis not present

## 2020-06-18 LAB — PROTIME-INR
INR: 1.7 — ABNORMAL HIGH (ref 0.8–1.2)
Prothrombin Time: 19 seconds — ABNORMAL HIGH (ref 11.4–15.2)

## 2020-06-18 NOTE — Progress Notes (Signed)
LVAD INR 

## 2020-06-18 NOTE — Progress Notes (Signed)
Patient presents for dressing change in VAD clinic today alone. Denies any issues with his VAD or alarms.   Exit Site Care: Drive line is being maintained weekly by VAD coordinators. Existing VAD dressing removed and site care performed using sterile technique. Drive line exit site cleaned with Chlora prep applicators x 2, and rinsed with saline, allowed to dry, and Sorbaview dressing with biopatch reapplied. Anchor reapplied. Drive line exit site incorporated, the velour is fully implanted at exit site. No drainage, erythema, tenderness, or foul odor noted.  Pt denies fever or chills. Continue weekly dressing changes. VAD coordinators to perform weekly dressing changes. Patient provided with 4 weekly dressing kits. Instructed to bring to each dressing change appointment.   [name], PharmD will contact patient with INR results and warfarin dosing.   Return to clinic in 1 week for full follow up visit.   Alyce Pagan RN VAD Coordinator  Office: [phone]  24/7 Pager: [phone]

## 2020-06-23 ENCOUNTER — Other Ambulatory Visit (HOSPITAL_COMMUNITY): Payer: Self-pay | Admitting: *Deleted

## 2020-06-23 DIAGNOSIS — Z95811 Presence of heart assist device: Secondary | ICD-10-CM

## 2020-06-23 DIAGNOSIS — Z7901 Long term (current) use of anticoagulants: Secondary | ICD-10-CM

## 2020-06-26 ENCOUNTER — Ambulatory Visit (HOSPITAL_COMMUNITY): Payer: Self-pay | Admitting: Pharmacist

## 2020-06-26 ENCOUNTER — Ambulatory Visit (HOSPITAL_COMMUNITY)
Admission: RE | Admit: 2020-06-26 | Discharge: 2020-06-26 | Disposition: A | Discharge status: Home / Self Care | Payer: Medicare Other | Source: Ambulatory Visit | Attending: Internal Medicine | Admitting: Internal Medicine

## 2020-06-26 ENCOUNTER — Other Ambulatory Visit: Payer: Self-pay

## 2020-06-26 VITALS — BP 100/0 | HR 93 | Ht 74.0 in | Wt 212.2 lb

## 2020-06-26 DIAGNOSIS — Z86010 Personal history of colonic polyps: Secondary | ICD-10-CM | POA: Insufficient documentation

## 2020-06-26 DIAGNOSIS — Z638 Other specified problems related to primary support group: Secondary | ICD-10-CM | POA: Diagnosis not present

## 2020-06-26 DIAGNOSIS — T8149XD Infection following a procedure, other surgical site, subsequent encounter: Secondary | ICD-10-CM | POA: Diagnosis not present

## 2020-06-26 DIAGNOSIS — Z87891 Personal history of nicotine dependence: Secondary | ICD-10-CM | POA: Diagnosis not present

## 2020-06-26 DIAGNOSIS — T827XXD Infection and inflammatory reaction due to other cardiac and vascular devices, implants and grafts, subsequent encounter: Secondary | ICD-10-CM | POA: Diagnosis not present

## 2020-06-26 DIAGNOSIS — I110 Hypertensive heart disease with heart failure: Secondary | ICD-10-CM | POA: Diagnosis not present

## 2020-06-26 DIAGNOSIS — Y838 Other surgical procedures as the cause of abnormal reaction of the patient, or of later complication, without mention of misadventure at the time of the procedure: Secondary | ICD-10-CM | POA: Diagnosis not present

## 2020-06-26 DIAGNOSIS — G4733 Obstructive sleep apnea (adult) (pediatric): Secondary | ICD-10-CM

## 2020-06-26 DIAGNOSIS — I10 Essential (primary) hypertension: Secondary | ICD-10-CM

## 2020-06-26 DIAGNOSIS — Z95811 Presence of heart assist device: Secondary | ICD-10-CM

## 2020-06-26 DIAGNOSIS — Z7901 Long term (current) use of anticoagulants: Secondary | ICD-10-CM

## 2020-06-26 DIAGNOSIS — Z8249 Family history of ischemic heart disease and other diseases of the circulatory system: Secondary | ICD-10-CM | POA: Diagnosis not present

## 2020-06-26 DIAGNOSIS — Z720 Tobacco use: Secondary | ICD-10-CM

## 2020-06-26 DIAGNOSIS — I5022 Chronic systolic (congestive) heart failure: Secondary | ICD-10-CM | POA: Diagnosis not present

## 2020-06-26 DIAGNOSIS — Z9989 Dependence on other enabling machines and devices: Secondary | ICD-10-CM

## 2020-06-26 DIAGNOSIS — Z79899 Other long term (current) drug therapy: Secondary | ICD-10-CM | POA: Insufficient documentation

## 2020-06-26 LAB — BASIC METABOLIC PANEL
Anion gap: 9 (ref 5–15)
BUN: 8 mg/dL (ref 6–20)
CO2: 25 mmol/L (ref 22–32)
Calcium: 9.1 mg/dL (ref 8.9–10.3)
Chloride: 106 mmol/L (ref 98–111)
Creatinine, Ser: 0.88 mg/dL (ref 0.61–1.24)
GFR, Estimated: >60 mL/min (ref >60)
Glucose, Bld: 112 mg/dL — ABNORMAL HIGH (ref 70–99)
Potassium: 3.8 mmol/L (ref 3.5–5.1)
Sodium: 140 mmol/L (ref 135–145)

## 2020-06-26 LAB — CBC
HCT: 40.1 % (ref 39.0–52.0)
Hemoglobin: 13.6 g/dL (ref 13.0–17.0)
MCH: 29.4 pg (ref 26.0–34.0)
MCHC: 33.9 g/dL (ref 30.0–36.0)
MCV: 86.6 fL (ref 80.0–100.0)
Platelets: 201 10*3/uL (ref 150–400)
RBC: 4.63 MIL/uL (ref 4.22–5.81)
RDW: 14.2 % (ref 11.5–15.5)
WBC: 4.8 10*3/uL (ref 4.0–10.5)
nRBC: 0 % (ref 0.0–0.2)

## 2020-06-26 LAB — PROTIME-INR
INR: 1.5 — ABNORMAL HIGH (ref 0.8–1.2)
Prothrombin Time: 18 seconds — ABNORMAL HIGH (ref 11.4–15.2)

## 2020-06-26 LAB — LACTATE DEHYDROGENASE: LDH: 213 U/L — ABNORMAL HIGH (ref 98–192)

## 2020-06-26 NOTE — Progress Notes (Signed)
Patient presents for 2 mo f/u in VAD clinic today with his daughter [place]. Denies any issues with his VAD or alarms.   Pt reports he has been feeling "great". He states that he is taking all medications as prescribed and that he is going to the gym and working out 1-2x a week. Denies abdominal pain. Denies ETOH use. Smoking 3-5 cigarettes daily.   Pt reports that he has not taken any Torsemide since his last visit.  Vital Signs: Doppler Pressure: 100 Automatc BP: 117/88 (101) HR: 93 SPO2: 99 % RA  Weight: 212.2 lbs w/ eqt Last wt: 204.4 lbs w/ equip   VAD Indication: Destination Therapy   VAD interrogation & Equipment Management: Speed: 6500 Flow: 5.9 Power: 5.9 w    PI: 3.3 Hct: 40  Alarms: few low voltage Events: 100+ today  Primary Controller:  Replace back up battery in 24 months Back up controller:   Replace back up battery in 7  months-pt did not bring to clinic today  I reviewed the LVAD parameters from today and compared the results to the patient's prior recorded data.  LVAD interrogation was NEGATIVE for significant power changes, NEGATIVE for clinical alarms and STABLE for PI events/speed drops. No programming changes were made and pump is functioning within specified parameters.   Exit Site Care: Drive line is being maintained weekly by VAD coordinators. Existing VAD dressing removed and site care performed using sterile technique. Drive line exit site cleaned with Chlora prep applicators x 2, RINSED WITH SALINE allowed to dry, and Sorbaview dressing withOUT biopatch reapplied. Anchor reapplied. Covered with 2 large tegaderms. Drive line exit site incorporated, the velour is fully implanted at exit site. No drainage, erythema, tenderness, or foul odor noted.  Pt denies fever or chills. Continue weekly dressing changes. VAD coordinators to perform weekly dressing changes. Pt has sufficient dressing kits at home.  Device:none  BP & Labs:  Doppler 100 -  reflecting MAP  Hgb 13.6 - No S/S of bleeding. Specifically denies melena/BRBPR or nosebleeds.  LDH 213 - established baseline of 200-300. Denies tea-colored urine. No power elevations noted on interrogation.   Patient Instructions:  1. No changes in medications 2. Return to clinic next week for dressing change and in 2 months for full visit with Dr. Chaney Malling this visit you will need to bring your equipment and all your batteries.   Carlton Adam RN VAD Coordinator  Office: [phone]  24/7 Pager: [phone]

## 2020-06-26 NOTE — Patient Instructions (Signed)
1. No changes in medications 2. Return to clinic next week for dressing change and in 2 months for full visit with Dr. Chaney Malling this visit you will need to bring your equipment and all your batteries.

## 2020-06-26 NOTE — Progress Notes (Signed)
LVAD INR 

## 2020-06-27 ENCOUNTER — Telehealth (HOSPITAL_COMMUNITY): Payer: Self-pay | Admitting: Licensed Clinical Social Worker

## 2020-06-27 ENCOUNTER — Other Ambulatory Visit (HOSPITAL_COMMUNITY): Payer: Self-pay | Admitting: Unknown Physician Specialty

## 2020-06-27 DIAGNOSIS — Z7901 Long term (current) use of anticoagulants: Secondary | ICD-10-CM

## 2020-06-27 DIAGNOSIS — Z95811 Presence of heart assist device: Secondary | ICD-10-CM

## 2020-06-27 NOTE — Progress Notes (Signed)
VAD Clinic Note   Date:  06/27/2020   ID:  Jonathan Wood, DOB [birth year], MRN [id number]  Location: Home  Provider location: [facility] Type of Visit: Established patient  PCP: [name]  Cardiologist:  Arvilla Meres, MD Primary HF: Dr [name]   Chief Complaint: Heart Failure/LVAD  History of Present Illness:  Jonathan Wood is a 50 y.o. 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  Admitted 08/21/2018 with volume overload and anemia. Diuresed with IV lasix and transitioned to torsemide 20 mg daily. GI consulted EGD completed and showed gastritis and nonbleeding duodenal ulcers, colonoscopy with 5 polyps removed. Protonix increased to BID, he was started on PO iron, and hydrocortisone suppositories for hemorrhoid treatment. He received a total of 2 uPRBCs. He will stay off ASA for several weeks but can reconsider later.  Discharge weight was 221 pounds.   Admitted 12/7-15/20 for DL infection with overlying cellulitis and volume overload. Treated with IV abx and diuresis.   Recently admitted 08/16/19-09/13/19 for DL infection with large subxiphoid abscess. Underwent several debridements and eventually a pec muscle flap. Wound cx + MSSA. Initially started on ancef and rifabutin but expanded to cefipime for a week then changed to IV ancef and po rifabutin. Now on oral keflex and rifabutin  Readmitted 11/13/19-11/16/19 with superficial abscess and tachypnea. Superficial abscess I&D'd and packed. CT scan done and showed possible deeper fluid collection. Reviewed with TCTS who felt it was muscle flap. Sent to IR for possible drainage but u/s confirmed it was the muscle flap and no drainable fluid collection. Wound cx negative. ID saw and recommended continuing Keflex.   Admitted 8/12-22/21 with lactic acidosis due to cardiogenic shock. Started on 0.25 of milrinone  and NE. Ramp echo on 8/13 with severe RV dysfunction. Speed increased 5600->6300. Tolerated ok. Repeat ramp echo on 8/20 speed increased to 6500. Diuresed over 40 pounds.   Admitted 03/15/20 with acute ETOH pancreatitis. Gallbladder ok. Resolved with conservative management.   Patient presents for 2 mo f/u in VAD clinic today with his daughter [place].    Says he is feeling great. Going to the gym and working out 1-2x a week. Denies abdominal pain. Denies ETOH use. Smoking 3-5 cigarettes daily.  Denies orthopnea or PND. No fevers, chills or problems with driveline. No bleeding, melena or neuro symptoms. No VAD alarms. Taking all meds as prescribed.    Pt reports that he has not taken any Torsemide since his last visit.   VAD Indication: Destination Therapy   VAD interrogation & Equipment Management: Speed: 6500 Flow: 5.9 Power: 5.9 w PI: 3.3 Hct: 40  Alarms: few low voltage Events: 100+ today  Primary Controller: Replace back up battery in 24 months Back up controller: Replace back up battery in 20months-pt did not bring to clinic today  Past Medical History:  Diagnosis Date  . Asthma   . CHF (congestive heart failure) (HCC)    a. 09/2016: EF 20-25% with cath showing normal cors  . GERD (gastroesophageal reflux disease)   . History of hiatal hernia   . LVAD (left ventricular assist device) present (HCC)   . OSA on CPAP 09/06/2018   Severe OSA with AHI 68/hr on CPAP at 12cm H2O   Past Surgical History:  Procedure Laterality Date  . APPLICATION OF A-CELL OF CHEST/ABDOMEN N/A 08/24/2019   Procedure: Application Of A-Cell Of  Chest/Abdomen;  Surgeon: Donata Clay, Theron Arista, MD;  Location: [facility] OR;  Service: Thoracic;  Laterality: N/A;  . APPLICATION OF A-CELL OF CHEST/ABDOMEN N/A 09/05/2019   Procedure: Application Of A-Cell Of Chest/Abdomen;  Surgeon: Peggye Form, DO;  Location: [facility] OR;  Service: Plastics;  Laterality: N/A;  . APPLICATION OF A-CELL OF CHEST/ABDOMEN   09/03/2019   Procedure: Application Of A-Cell Of Chest/Abdomen;  Surgeon: Kerin Perna, MD;  Location: [facility] OR;  Service: Thoracic;;  . APPLICATION OF WOUND VAC N/A 08/22/2019   Procedure: Debridement, Irrigation and Packing of Abdominal Incision.;  Surgeon: Kerin Perna, MD;  Location: [facility] OR;  Service: Thoracic;  Laterality: N/A;  . APPLICATION OF WOUND VAC N/A 08/24/2019   Procedure: Irrigation and Debridement with WOUND VAC APPLICATION;  Surgeon: Kerin Perna, MD;  Location: [facility] OR;  Service: Thoracic;  Laterality: N/A;  . APPLICATION OF WOUND VAC N/A 08/30/2019   Procedure: APPLICATION OF WOUND VAC;  Surgeon: Kerin Perna, MD;  Location: [facility] OR;  Service: Thoracic;  Laterality: N/A;  . APPLICATION OF WOUND VAC N/A 09/03/2019   Procedure: WOUND VAC CHANGE;  Surgeon: Kerin Perna, MD;  Location: [facility] OR;  Service: Thoracic;  Laterality: N/A;  . BIOPSY  08/23/2018   Procedure: BIOPSY;  Surgeon: Lemar Lofty., MD;  Location: [facility] ENDOSCOPY;  Service: Gastroenterology;;  . COLONOSCOPY N/A 08/23/2018   Procedure: COLONOSCOPY;  Surgeon: Lemar Lofty., MD;  Location: [facility] ENDOSCOPY;  Service: Gastroenterology;  Laterality: N/A;  . ENTEROSCOPY N/A 08/23/2018   Procedure: ENTEROSCOPY;  Surgeon: Meridee Score Netty Starring., MD;  Location: [facility] ENDOSCOPY;  Service: Gastroenterology;  Laterality: N/A;  . HEMOSTASIS CLIP PLACEMENT  08/23/2018   Procedure: HEMOSTASIS CLIP PLACEMENT;  Surgeon: Lemar Lofty., MD;  Location: [facility] ENDOSCOPY;  Service: Gastroenterology;;  . IABP INSERTION N/A 09/05/2017   Procedure: IABP INSERTION;  Surgeon: Dolores Patty, MD;  Location: [facility] INVASIVE CV LAB;  Service: Cardiovascular;  Laterality: N/A;  . INSERTION OF IMPLANTABLE LEFT VENTRICULAR ASSIST DEVICE N/A 09/06/2017   Procedure: INSERTION OF IMPLANTABLE LEFT VENTRICULAR ASSIST DEVICE - HM3;  Surgeon: Kerin Perna, MD;  Location: [facility] OR;  Service: Open Heart Surgery;  Laterality: N/A;  HM3  .  MUSCLE FLAP CLOSURE N/A 09/05/2019   Procedure: MUSCLE FLAP CLOSURE;  Surgeon: Peggye Form, DO;  Location: [facility] OR;  Service: Plastics;  Laterality: N/A;  . NASAL FRACTURE SURGERY  1987  . POLYPECTOMY  08/23/2018   Procedure: POLYPECTOMY;  Surgeon: Mansouraty, Netty Starring., MD;  Location: [facility] ENDOSCOPY;  Service: Gastroenterology;;  . RIGHT HEART CATH N/A 08/30/2017   Procedure: RIGHT HEART CATH;  Surgeon: Dolores Patty, MD;  Location: [facility] INVASIVE CV LAB;  Service: Cardiovascular;  Laterality: N/A;  . RIGHT/LEFT HEART CATH AND CORONARY ANGIOGRAPHY N/A 10/08/2016   Procedure: Right/Left Heart Cath and Coronary Angiography;  Surgeon: Orpah Cobb, MD;  Location: [facility] INVASIVE CV LAB;  Service: Cardiovascular;  Laterality: N/A;  . STERNAL WOUND DEBRIDEMENT N/A 08/20/2019   Procedure: DEBRIDEMENT OF LVAD DRIVELINE TUNNEL;  Surgeon: Kerin Perna, MD;  Location: [facility] OR;  Service: Thoracic;  Laterality: N/A;  . STERNAL WOUND DEBRIDEMENT N/A 09/05/2019   Procedure: DEBRIDEMENT AND CLOSURE OF ABDOMINAL WOUND;  Surgeon: Peggye Form, DO;  Location: [facility] OR;  Service: Plastics;  Laterality: N/A;  . SUBMUCOSAL TATTOO INJECTION  08/23/2018   Procedure: SUBMUCOSAL TATTOO INJECTION;  Surgeon: Lemar Lofty., MD;  Location: [facility] ENDOSCOPY;  Service: Gastroenterology;;  . TEE WITHOUT CARDIOVERSION  N/A 09/06/2017   Procedure: TRANSESOPHAGEAL ECHOCARDIOGRAM (TEE);  Surgeon: Donata Clay, Theron Arista, MD;  Location: [facility] OR;  Service: Open Heart Surgery;  Laterality: N/A;  . WOUND DEBRIDEMENT N/A 08/30/2019   Procedure: Debridement Abdominal Wound;  Surgeon: Kerin Perna, MD;  Location: [facility] OR;  Service: Thoracic;  Laterality: N/A;     Current Outpatient Medications  Medication Sig Dispense Refill  . cephALEXin (KEFLEX) 500 MG capsule Take 1 capsule (500 mg total) by mouth 3 (three) times daily. 90 capsule 11  . DULoxetine (CYMBALTA) 60 MG capsule Take 1 capsule (60 mg total) by mouth daily. 30 capsule 6   . gabapentin (NEURONTIN) 300 MG capsule TAKE ONE CAPSULE BY MOUTH THREE TIMES DAILY (Patient taking differently: Take 300 mg by mouth 3 (three) times daily.) 90 capsule 6  . hydrOXYzine (ATARAX/VISTARIL) 25 MG tablet Take 2 tablets (50 mg total) by mouth at bedtime as needed for anxiety. (Patient taking differently: Take 50 mg by mouth at bedtime as needed (sleep).) 60 tablet 11  . sacubitril-valsartan (ENTRESTO) 49-51 MG Take 1 tablet by mouth 2 (two) times daily. 60 tablet 3  . sildenafil (REVATIO) 20 MG tablet Take 1 tablet (20 mg total) by mouth 3 (three) times daily. 90 tablet 11  . spironolactone (ALDACTONE) 25 MG tablet Take 0.5 tablets (12.5 mg total) by mouth daily. 30 tablet 6  . warfarin (COUMADIN) 5 MG tablet Take 5 mg (1 tab) every Monday, Wed, and Friday and 2.5 mg (0.5 tab) all other days or as directed by HF Clinic. 180 tablet 3  . acetaminophen (TYLENOL) 500 MG tablet Take 1,500 mg by mouth 3 (three) times daily as needed for headache (pain). (Patient not taking: Reported on 06/26/2020)    . albuterol (PROAIR HFA) 108 (90 Base) MCG/ACT inhaler Inhale 2 puffs into the lungs every 6 (six) hours as needed for wheezing or shortness of breath. (Patient not taking: Reported on 06/26/2020) 1 each 6  . docusate sodium (STOOL SOFTENER) 100 MG capsule Take 1 capsule (100 mg total) by mouth 2 (two) times daily. (Patient not taking: No sig reported) 60 capsule 11  . nicotine (NICODERM CQ - DOSED IN MG/24 HOURS) 21 mg/24hr patch Place 1 patch (21 mg total) onto the skin daily. (Patient not taking: Reported on 06/26/2020) 28 patch 3  . pantoprazole (PROTONIX) 40 MG tablet Take 1 tablet (40 mg total) by mouth 2 (two) times daily. (Patient not taking: Reported on 06/26/2020) 30 tablet 6  . torsemide (DEMADEX) 20 MG tablet Take 1 tablet (20 mg total) by mouth every other day. (Patient not taking: Reported on 06/26/2020) 90 tablet 3   No current facility-administered medications for this encounter.     Allergies:   Patient has no known allergies.   Social History:  The patient  reports that he has quit smoking. His smoking use included cigarettes. He has a 12.50 pack-year smoking history. He has never used smokeless tobacco. He reports previous alcohol use. He reports previous drug use. Drug: Marijuana.   Family History:  The patient's family history includes Alcohol abuse in his cousin; Hypertension in his father.   ROS:  Please see the history of present illness.   All other systems are personally reviewed and negative.    Vital Signs: Doppler Pressure: 100 Automatc BP: 117/88 (101) HR: 93 SPO2: 99 % RA  Weight: 212.2 lbs w/ eqt Last wt: 204.4 lbs w/ equip   General:  NAD.  HEENT: normal  Neck: supple. JVP not elevated.  Carotids 2+ bilat; no bruits. No lymphadenopathy or thryomegaly appreciated. Cor: LVAD hum.  Lungs: Clear. Abdomen: soft, nontender, non-distended. No hepatosplenomegaly. No bruits or masses. Good bowel sounds. Driveline site clean. Anchor in place.  Extremities: no cyanosis, clubbing, rash. Warm no edema  Neuro: alert & oriented x 3. No focal deficits. Moves all 4 without problem     Recent Labs: 08/13/2019: TSH 0.209 03/15/2020: Magnesium 1.8 04/23/2020: ALT 21 06/26/2020: BUN 8; Creatinine, Ser 0.88; Hemoglobin 13.6; Platelets 201; Potassium 3.8; Sodium 140    Wt Readings from Last 3 Encounters:  06/26/20 96.3 kg (212 lb 3.2 oz)  03/26/20 88.8 kg (195 lb 12.8 oz)  03/20/20 88.7 kg (195 lb 8.8 oz)      ASSESSMENT AND PLAN:  1. 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 Treated with milrinone and then weaned off.  Diuresed 40 pounds. VAD speed increased 5600 -> 6300 - He looks as good as I have seen him in a very long time. NYHA II  - Volume status ok. Take torsemide as needed   2.  ETOH pancreatitis, 11/21 - resolved.  - says he has quit  drinking (was stressed about his son) - we offered him support  3. Recurrent DL infection with subxiphoid abscess - s/p debridement and pec flap in 4/21 - remains on keflex - had recurrent superficial subxiphoid abscess. CT with posible fluid collection. Evaluated by TCTS And IR and found to be muscle flap with no draiable fluid collection 6/21  - Site looks great today. Continue Keflex - He is not a candidate for pump exchange given severe RV dysfunction and poor self-care  4. HM3 LVAD 09/06/2017.  - VAD interrogated personally. Parameters stable. - LDH 213 - INR 1.5 goal 2.0-2.5  Discussed dosing with PharmD personally. - Off ASA with PUD.  - hgb 13.6  5.  Essential HTN - MAPs are up slightly but not too bad. Continue current regimen   7. OSA - sleep study with very severe OSA (AHI 69/hr) - continues to refuse therapy. We have arranged f/u in sleep clinic several times but he has not been compliant - He has lost weight and have offered retesting but he refuses  8. H/o GI bleed  - 08/2018 EGD completed and showed gastritis and nonbleeding duodenal ulcers, colonoscopy with 5 polyps removed. Protonix was increased to BID.  - No recent GI bleeding - hgb 12.8 MCV 84 - He will stay off aspirin for now.   8. Tobacco Abuse  - continues to smoke a few cigs/day - reinforced need to quit  9. Elevated bilirubin and low pre-albumin - suspected related to ETOH use and RV failure +/- fatty liver  Total time spent 35 minutes. Over half that time spent discussing above.    Signed, Arvilla Meres, MD  06/27/2020 11:37 AM  [facility] [address] Heart and Vascular [place] [address] [phone] (office) [phone] (fax)

## 2020-06-27 NOTE — Telephone Encounter (Signed)
CSW called pt to assess if he is interested in working with our Health Coach Intern to help quit smoking.  Pt is agreeable to this plan and feels as if he definitely needs help with this goal.  Anticipate health coach will be reaching out next week.  Will continue to follow and assist as needed  Burna Sis, LCSW Clinical Social Worker [facility] Desk#: [phone] Cell#: [phone]

## 2020-06-30 ENCOUNTER — Other Ambulatory Visit: Payer: Self-pay | Admitting: Gastroenterology

## 2020-07-01 ENCOUNTER — Encounter (HOSPITAL_COMMUNITY): Payer: Self-pay

## 2020-07-01 NOTE — Progress Notes (Signed)
Appointment Outcome:   Completed, rescheduled, no show, etc.   Session #: 1 - Intake Session   AGREEMENTS SECTION    Overall Goal(s): Tobacco Cessation, Stress Management                        Agreement/Action Steps:  N/A  Progress Notes: Patient stated he wants to quit "cold [place]". Patient has to motivation to quit smoking "cold [place]", because he was able to do it before. Has to be cigarette and nicotine free for 7 months before considered for heart transplant. Patient expressed that he currently takes various medications, so he doesn't think additional medication for tobacco cessation will be a fit. Patient has nicotine patches however, is not a place of being nicotine-free, so the patches also wouldn't be a fit for the patient right now. Patient copes with stress and conflict by smoking. Patient stated he smokes about 14 cigarettes a day. He smokes first thing in the morning and when triggered by conflict. Patient enjoys music and watching sports and uses them both as ways of coping with stress outside of smoking. Will work with the patient to come up with stress management techniques outside of smoking to cope with stress. Patient was tending to a young, sick, child so will follow back up with patient on Jul 02, 2020 to finish initial intake session.          . Indicators of Success and Accountability: Wants to be eligible for heart plant. Has a young child, and uses the motivation that he was capable of doing it before. . Readiness: According to the Assessment of Motivation: Readiness to [employer], Patient rated himself at an 8.    "I still smoke, but I have begun to change, like cutting back on the number of cigarettes I smoke. I am ready to set a quit date.".  . Challenges and Barriers: Conflict and eating breakfast.   Coaching Outcomes: Spent 15 minutes with patient on telephone for initial session. Patient requested a call back on Jul 02, 2020. Will call patient back at  12:30pm on that date.  Will continue initial session during this time to set up action steps towards smoking cessation and stress management.    Lamar Blinks., MPH Candidate H&V Health Coach Intern

## 2020-07-01 NOTE — Progress Notes (Signed)
Appointment Outcome:   Completed, rescheduled, no show, etc.   Session #: 1 - Intake Session   AGREEMENTS SECTION    Overall Goal(s): Tobacco Cessation, Stress Management                        Agreement/Action Steps:  N/A  Progress Notes: Patient stated he wants to quit "cold [place]". Patient has to motivation to quit smoking "cold [place]", because he was able to do it before. Has to be cigarette and nicotine free for 7 months before considered for heart transplant. Patient expressed that he currently takes various medications, so he doesn't think additional medication for tobacco cessation will be a fit. Patient has nicotine patches however, is not a place of being nicotine-free, so the patches also wouldn't be a fit for the patient right now. Patient copes with stress and conflict by smoking. Patient stated he smokes about 14 cigarettes a day. He smokes first thing in the morning and when triggered by conflict. Patient enjoys music and watching sports and uses them both as ways of coping with stress outside of smoking. Will work with the patient to come up with stress management techniques outside of smoking to cope with stress. Patient was tending to a young, sick, child so will follow back up with patient on Jul 02, 2020 to finish initial intake session.          . Indicators of Success and Accountability: Wants to be eligible for heart plant. Has a young child, and uses the motivation that he was capable of doing it before. . Readiness: According to the Assessment of Motivation: Readiness to [employer], Patient rated himself at an 8.    "I still smoke, but I have begun to change, like cutting back on the number of cigarettes I smoke. I am ready to set a quit date.".  . Challenges and Barriers: Conflict and eating breakfast.   Coaching Outcomes: Spent 15 minutes with patient on telephone for initial session. Patient requested a call back on Jul 02, 2020. Will call patient back at  12:30pm on that date.  Will continue initial session during this time to set up action steps towards smoking cessation and stress management.    Lamar Blinks., MPH Candidate H&V Health Coach Intern   Patient was seen by MPH Candidate for Health Coaching.   Health Coach Preceptor agrees with assessment and plan.   Renaee Munda, MS, ERHD, [facility] 07/01/2020 12:53 PM

## 2020-07-02 ENCOUNTER — Telehealth (HOSPITAL_COMMUNITY): Payer: Self-pay

## 2020-07-02 DIAGNOSIS — Z0000 Encounter for general adult medical examination without abnormal findings: Secondary | ICD-10-CM

## 2020-07-02 NOTE — Telephone Encounter (Signed)
Called patient to continue initial health coaching session. Left a message for patient to call back to reschedule [phone])   Leanor Kail, B.S., MPH Candidate H&V Health Coach Intern

## 2020-07-03 ENCOUNTER — Ambulatory Visit (HOSPITAL_COMMUNITY)
Admission: RE | Admit: 2020-07-03 | Discharge: 2020-07-03 | Disposition: A | Discharge status: Home / Self Care | Payer: Medicare Other | Source: Ambulatory Visit | Attending: Cardiology | Admitting: Cardiology

## 2020-07-03 ENCOUNTER — Telehealth (HOSPITAL_COMMUNITY): Payer: Self-pay

## 2020-07-03 ENCOUNTER — Other Ambulatory Visit: Payer: Self-pay

## 2020-07-03 ENCOUNTER — Ambulatory Visit (HOSPITAL_COMMUNITY): Payer: Self-pay | Admitting: Pharmacist

## 2020-07-03 DIAGNOSIS — Z95811 Presence of heart assist device: Secondary | ICD-10-CM | POA: Insufficient documentation

## 2020-07-03 DIAGNOSIS — Z7901 Long term (current) use of anticoagulants: Secondary | ICD-10-CM | POA: Insufficient documentation

## 2020-07-03 LAB — PROTIME-INR
INR: 2.1 — ABNORMAL HIGH (ref 0.8–1.2)
Prothrombin Time: 22.5 seconds — ABNORMAL HIGH (ref 11.4–15.2)

## 2020-07-03 NOTE — Progress Notes (Signed)
Patient presents for dressing change in VAD clinic today alone. Denies any issues with his VAD or alarms.   Exit Site Care: Drive line is being maintained weekly by VAD coordinators. Existing VAD dressing removed and site care performed using sterile technique. Drive line exit site cleaned with Chlora prep applicators x 2, and rinsed with saline, allowed to dry, and Sorbaview dressing with biopatch reapplied. Anchor reapplied. Drive line exit site incorporated, the velour is fully implanted at exit site. No drainage, erythema, tenderness, or foul odor noted.  Pt denies fever or chills. Continue weekly dressing changes. VAD coordinators to perform weekly dressing changes. Patient has sufficient dressing kits at home.  [name], PharmD will contact patient with INR results and warfarin dosing.   Return to clinic in 1 week for dressing change only if INR is therapeutic.   Carlton Adam RN VAD Coordinator  Office: [phone]  24/7 Pager: [phone]

## 2020-07-03 NOTE — Progress Notes (Signed)
LVAD INR 

## 2020-07-03 NOTE — Telephone Encounter (Signed)
Connected with patient to finish initial health coaching session. However, patient stated that he was unavailable and to reschedule session on Monday July 07, 2020 at 12:30pm.

## 2020-07-08 ENCOUNTER — Telehealth (HOSPITAL_COMMUNITY): Payer: Self-pay | Admitting: *Deleted

## 2020-07-08 NOTE — Telephone Encounter (Signed)
Patient was seen by MPH Candidate for Health Coaching.   Health Coach Preceptor agrees with assessment and plan.   Lesia Hausen, MS, ACSM-CEP, [facility]  07/08/2020 2:56 PM

## 2020-07-08 NOTE — Telephone Encounter (Signed)
Third and final attempt of contacting patient for initial coaching session. Left a voice message for patient to call back at [phone] if he chooses to move forward with health coaching. Lamar Blinks., MPH Candidate H&V Health Coach Intern

## 2020-07-08 NOTE — Telephone Encounter (Cosign Needed)
error 

## 2020-07-10 ENCOUNTER — Other Ambulatory Visit (HOSPITAL_COMMUNITY): Payer: Medicare Other

## 2020-07-11 ENCOUNTER — Other Ambulatory Visit (HOSPITAL_COMMUNITY): Payer: Self-pay | Admitting: *Deleted

## 2020-07-11 DIAGNOSIS — Z95811 Presence of heart assist device: Secondary | ICD-10-CM

## 2020-07-11 DIAGNOSIS — Z7901 Long term (current) use of anticoagulants: Secondary | ICD-10-CM

## 2020-07-14 ENCOUNTER — Ambulatory Visit (HOSPITAL_COMMUNITY)
Admission: RE | Admit: 2020-07-14 | Discharge: 2020-07-14 | Disposition: A | Discharge status: Home / Self Care | Payer: Medicare Other | Source: Ambulatory Visit | Attending: Internal Medicine | Admitting: Internal Medicine

## 2020-07-14 ENCOUNTER — Other Ambulatory Visit: Payer: Self-pay

## 2020-07-14 ENCOUNTER — Ambulatory Visit (HOSPITAL_COMMUNITY): Payer: Self-pay | Admitting: Pharmacist

## 2020-07-14 DIAGNOSIS — Z5181 Encounter for therapeutic drug level monitoring: Secondary | ICD-10-CM | POA: Diagnosis not present

## 2020-07-14 DIAGNOSIS — Z7901 Long term (current) use of anticoagulants: Secondary | ICD-10-CM | POA: Diagnosis not present

## 2020-07-14 DIAGNOSIS — Z95811 Presence of heart assist device: Secondary | ICD-10-CM | POA: Insufficient documentation

## 2020-07-14 LAB — PROTIME-INR
INR: 1.8 — ABNORMAL HIGH (ref 0.8–1.2)
Prothrombin Time: 20.2 seconds — ABNORMAL HIGH (ref 11.4–15.2)

## 2020-07-14 NOTE — Progress Notes (Signed)
Patient presents for dressing change in VAD clinic today alone. Denies any issues with his VAD or alarms.   Exit Site Care: Drive line is being maintained weekly by VAD coordinators. Existing VAD dressing removed and site care performed using sterile technique. Drive line exit site cleaned with Chlora prep applicators x 2, and rinsed with saline, allowed to dry, and Sorbaview dressing with biopatch reapplied. Anchor reapplied. Drive line exit site incorporated, the velour is fully implanted at exit site. No drainage, erythema, tenderness, or foul odor noted.  Pt denies fever or chills. Continue weekly dressing changes. VAD coordinators to perform weekly dressing changes. Patient has sufficient dressing kits at home.  [name], PharmD will contact patient with INR results and warfarin dosing.   Return to clinic in 1 week for dressing change only if INR is therapeutic.   Alyce Pagan RN VAD Coordinator  Office: [phone]  24/7 Pager: [phone]

## 2020-07-14 NOTE — Progress Notes (Signed)
LVAD INR 

## 2020-07-16 ENCOUNTER — Other Ambulatory Visit (HOSPITAL_COMMUNITY): Payer: Self-pay | Admitting: *Deleted

## 2020-07-16 DIAGNOSIS — Z7901 Long term (current) use of anticoagulants: Secondary | ICD-10-CM

## 2020-07-16 DIAGNOSIS — Z95811 Presence of heart assist device: Secondary | ICD-10-CM

## 2020-07-23 ENCOUNTER — Other Ambulatory Visit (HOSPITAL_COMMUNITY): Payer: Medicare Other

## 2020-07-24 ENCOUNTER — Other Ambulatory Visit (HOSPITAL_COMMUNITY): Payer: Medicare Other

## 2020-07-30 ENCOUNTER — Other Ambulatory Visit: Payer: Self-pay

## 2020-07-30 ENCOUNTER — Ambulatory Visit (HOSPITAL_COMMUNITY)
Admission: RE | Admit: 2020-07-30 | Discharge: 2020-07-30 | Disposition: A | Discharge status: Home / Self Care | Payer: Medicare Other | Source: Ambulatory Visit | Attending: Internal Medicine | Admitting: Internal Medicine

## 2020-07-30 ENCOUNTER — Ambulatory Visit (HOSPITAL_COMMUNITY): Payer: Self-pay | Admitting: Pharmacist

## 2020-07-30 DIAGNOSIS — Z95811 Presence of heart assist device: Secondary | ICD-10-CM | POA: Insufficient documentation

## 2020-07-30 DIAGNOSIS — Z4801 Encounter for change or removal of surgical wound dressing: Secondary | ICD-10-CM | POA: Insufficient documentation

## 2020-07-30 DIAGNOSIS — Z7901 Long term (current) use of anticoagulants: Secondary | ICD-10-CM | POA: Insufficient documentation

## 2020-07-30 LAB — PROTIME-INR
INR: 2 — ABNORMAL HIGH (ref 0.8–1.2)
Prothrombin Time: 22.1 seconds — ABNORMAL HIGH (ref 11.4–15.2)

## 2020-07-30 NOTE — Progress Notes (Signed)
LVAD INR 

## 2020-07-30 NOTE — Progress Notes (Signed)
Patient presents for dressing change in VAD clinic today alone. Denies any issues with his VAD or alarms.   Exit Site Care: Drive line is being maintained weekly by VAD coordinators. Existing VAD dressing removed and site care performed using sterile technique. Drive line exit site cleaned with Chlora prep applicators x 2, and rinsed with saline, allowed to dry, and Sorbaview dressing with biopatch reapplied. Anchor reapplied. Drive line exit site incorporated, the velour is fully implanted at exit site. No drainage, erythema, tenderness, or foul odor noted.  Pt denies fever or chills. Continue weekly dressing changes. VAD coordinators to perform weekly dressing changes. Patient has sufficient dressing kits at home.  [name], PharmD will contact patient with INR results and warfarin dosing.   Return to clinic in 1 week for dressing change only if INR is therapeutic.   Alyce Pagan RN VAD Coordinator  Office: [phone]  24/7 Pager: [phone]

## 2020-08-05 ENCOUNTER — Other Ambulatory Visit (HOSPITAL_COMMUNITY): Payer: Medicare Other

## 2020-08-06 ENCOUNTER — Other Ambulatory Visit (HOSPITAL_COMMUNITY): Payer: Medicare Other

## 2020-08-07 ENCOUNTER — Ambulatory Visit (HOSPITAL_COMMUNITY)
Admission: RE | Admit: 2020-08-07 | Discharge: 2020-08-07 | Disposition: A | Discharge status: Home / Self Care | Payer: Medicare Other | Source: Ambulatory Visit | Attending: Cardiology | Admitting: Cardiology

## 2020-08-07 ENCOUNTER — Other Ambulatory Visit: Payer: Self-pay

## 2020-08-07 ENCOUNTER — Other Ambulatory Visit (HOSPITAL_COMMUNITY): Payer: Self-pay | Admitting: *Deleted

## 2020-08-07 DIAGNOSIS — Z95811 Presence of heart assist device: Secondary | ICD-10-CM

## 2020-08-07 DIAGNOSIS — Z4801 Encounter for change or removal of surgical wound dressing: Secondary | ICD-10-CM | POA: Insufficient documentation

## 2020-08-07 DIAGNOSIS — Z7901 Long term (current) use of anticoagulants: Secondary | ICD-10-CM

## 2020-08-07 NOTE — Progress Notes (Signed)
Patient presents for dressing change in VAD clinic today alone. Denies any issues with his VAD or alarms.   Exit Site Care: Drive line is being maintained weekly by VAD coordinators. Existing VAD dressing removed and site care performed using sterile technique. Drive line exit site cleaned with Chlora prep applicators x 2, and rinsed with saline, allowed to dry, and Sorbaview dressing with biopatch reapplied. Anchor reapplied. Drive line exit site incorporated, the velour is fully implanted at exit site. No drainage, erythema, tenderness, or foul odor noted.  Pt denies fever or chills. Continue weekly dressing changes. VAD coordinators to perform weekly dressing changes. Patient has sufficient dressing kits at home.  Return to clinic in 1 week for INR and dressing change.    Hessie Diener RN VAD Coordinator  Office: [phone]  24/7 Pager: [phone]

## 2020-08-14 ENCOUNTER — Other Ambulatory Visit (HOSPITAL_COMMUNITY): Payer: Self-pay | Admitting: Internal Medicine

## 2020-08-14 ENCOUNTER — Other Ambulatory Visit (HOSPITAL_COMMUNITY): Payer: Medicare Other

## 2020-08-18 ENCOUNTER — Ambulatory Visit (HOSPITAL_COMMUNITY)
Admission: RE | Admit: 2020-08-18 | Discharge: 2020-08-18 | Disposition: A | Discharge status: Home / Self Care | Payer: Medicare Other | Source: Ambulatory Visit | Attending: Internal Medicine | Admitting: Internal Medicine

## 2020-08-18 ENCOUNTER — Other Ambulatory Visit: Payer: Self-pay

## 2020-08-18 ENCOUNTER — Ambulatory Visit (HOSPITAL_COMMUNITY): Payer: Self-pay | Admitting: Pharmacist

## 2020-08-18 DIAGNOSIS — Z7901 Long term (current) use of anticoagulants: Secondary | ICD-10-CM | POA: Diagnosis not present

## 2020-08-18 DIAGNOSIS — Z95811 Presence of heart assist device: Secondary | ICD-10-CM | POA: Diagnosis not present

## 2020-08-18 DIAGNOSIS — Z5181 Encounter for therapeutic drug level monitoring: Secondary | ICD-10-CM | POA: Insufficient documentation

## 2020-08-18 LAB — PROTIME-INR
INR: 2.4 — ABNORMAL HIGH (ref 0.8–1.2)
Prothrombin Time: 25.3 seconds — ABNORMAL HIGH (ref 11.4–15.2)

## 2020-08-18 NOTE — Progress Notes (Signed)
LVAD INR 

## 2020-08-18 NOTE — Progress Notes (Signed)
Patient presents for dressing change in VAD clinic today alone. Denies any issues with his VAD or alarms.   Exit Site Care: Drive line is being maintained weekly by VAD coordinators. Existing VAD dressing removed and site care performed using sterile technique. Drive line exit site cleaned with Chlora prep applicators x 2, and rinsed with saline, allowed to dry, and Sorbaview dressing with biopatch reapplied. Anchor reapplied. Drive line exit site incorporated, the velour is fully implanted at exit site. No drainage, erythema, tenderness, or foul odor noted.  Pt denies fever or chills. Continue weekly dressing changes. VAD coordinators to perform weekly dressing changes. Patient has sufficient dressing kits at home.  Return to clinic in 1 week for INR and dressing change.    Carlton Adam RN VAD Coordinator  Office: [phone]  24/7 Pager: [phone]

## 2020-08-27 ENCOUNTER — Other Ambulatory Visit (HOSPITAL_COMMUNITY): Payer: Self-pay | Admitting: *Deleted

## 2020-08-27 DIAGNOSIS — Z0000 Encounter for general adult medical examination without abnormal findings: Secondary | ICD-10-CM

## 2020-08-27 DIAGNOSIS — D649 Anemia, unspecified: Secondary | ICD-10-CM

## 2020-08-27 DIAGNOSIS — Z7901 Long term (current) use of anticoagulants: Secondary | ICD-10-CM

## 2020-08-27 DIAGNOSIS — Z95811 Presence of heart assist device: Secondary | ICD-10-CM

## 2020-08-28 ENCOUNTER — Encounter (HOSPITAL_COMMUNITY): Payer: Self-pay | Admitting: Unknown Physician Specialty

## 2020-08-28 ENCOUNTER — Encounter (HOSPITAL_COMMUNITY): Payer: Medicare Other

## 2020-09-03 ENCOUNTER — Other Ambulatory Visit (HOSPITAL_COMMUNITY): Payer: Self-pay | Admitting: Unknown Physician Specialty

## 2020-09-03 ENCOUNTER — Other Ambulatory Visit: Payer: Self-pay

## 2020-09-03 ENCOUNTER — Ambulatory Visit (HOSPITAL_COMMUNITY)
Admission: RE | Admit: 2020-09-03 | Discharge: 2020-09-03 | Disposition: A | Discharge status: Home / Self Care | Payer: Medicare Other | Source: Ambulatory Visit | Attending: Internal Medicine | Admitting: Internal Medicine

## 2020-09-03 ENCOUNTER — Ambulatory Visit (HOSPITAL_COMMUNITY): Payer: Self-pay | Admitting: Pharmacist

## 2020-09-03 VITALS — BP 112/0 | HR 95 | Ht 74.0 in | Wt 210.4 lb

## 2020-09-03 DIAGNOSIS — Z8249 Family history of ischemic heart disease and other diseases of the circulatory system: Secondary | ICD-10-CM | POA: Insufficient documentation

## 2020-09-03 DIAGNOSIS — K859 Acute pancreatitis without necrosis or infection, unspecified: Secondary | ICD-10-CM | POA: Diagnosis not present

## 2020-09-03 DIAGNOSIS — Z9989 Dependence on other enabling machines and devices: Secondary | ICD-10-CM

## 2020-09-03 DIAGNOSIS — I110 Hypertensive heart disease with heart failure: Secondary | ICD-10-CM | POA: Diagnosis not present

## 2020-09-03 DIAGNOSIS — Z95811 Presence of heart assist device: Secondary | ICD-10-CM | POA: Diagnosis not present

## 2020-09-03 DIAGNOSIS — D649 Anemia, unspecified: Secondary | ICD-10-CM | POA: Insufficient documentation

## 2020-09-03 DIAGNOSIS — Z79899 Other long term (current) drug therapy: Secondary | ICD-10-CM | POA: Insufficient documentation

## 2020-09-03 DIAGNOSIS — E876 Hypokalemia: Secondary | ICD-10-CM | POA: Insufficient documentation

## 2020-09-03 DIAGNOSIS — I5022 Chronic systolic (congestive) heart failure: Secondary | ICD-10-CM | POA: Diagnosis not present

## 2020-09-03 DIAGNOSIS — Z7901 Long term (current) use of anticoagulants: Secondary | ICD-10-CM

## 2020-09-03 DIAGNOSIS — Z0000 Encounter for general adult medical examination without abnormal findings: Secondary | ICD-10-CM

## 2020-09-03 DIAGNOSIS — F17210 Nicotine dependence, cigarettes, uncomplicated: Secondary | ICD-10-CM | POA: Diagnosis not present

## 2020-09-03 DIAGNOSIS — Z720 Tobacco use: Secondary | ICD-10-CM

## 2020-09-03 DIAGNOSIS — G4733 Obstructive sleep apnea (adult) (pediatric): Secondary | ICD-10-CM | POA: Diagnosis not present

## 2020-09-03 DIAGNOSIS — I10 Essential (primary) hypertension: Secondary | ICD-10-CM

## 2020-09-03 LAB — PREALBUMIN: Prealbumin: 18.1 mg/dL (ref 18–38)

## 2020-09-03 LAB — CBC
HCT: 43.1 % (ref 39.0–52.0)
Hemoglobin: 13.9 g/dL (ref 13.0–17.0)
MCH: 29.8 pg (ref 26.0–34.0)
MCHC: 32.3 g/dL (ref 30.0–36.0)
MCV: 92.5 fL (ref 80.0–100.0)
Platelets: 204 10*3/uL (ref 150–400)
RBC: 4.66 MIL/uL (ref 4.22–5.81)
RDW: 14.4 % (ref 11.5–15.5)
WBC: 8.7 10*3/uL (ref 4.0–10.5)
nRBC: 0 % (ref 0.0–0.2)

## 2020-09-03 LAB — IRON AND TIBC
Iron: 30 ug/dL — ABNORMAL LOW (ref 45–182)
Saturation Ratios: 8 % — ABNORMAL LOW (ref 17.9–39.5)
TIBC: 378 ug/dL (ref 250–450)
UIBC: 348 ug/dL

## 2020-09-03 LAB — COMPREHENSIVE METABOLIC PANEL
ALT: 12 U/L (ref 0–44)
AST: 23 U/L (ref 15–41)
Albumin: 3.6 g/dL (ref 3.5–5.0)
Alkaline Phosphatase: 84 U/L (ref 38–126)
Anion gap: 10 (ref 5–15)
BUN: 8 mg/dL (ref 6–20)
CO2: 21 mmol/L — ABNORMAL LOW (ref 22–32)
Calcium: 8.8 mg/dL — ABNORMAL LOW (ref 8.9–10.3)
Chloride: 105 mmol/L (ref 98–111)
Creatinine, Ser: 0.97 mg/dL (ref 0.61–1.24)
GFR, Estimated: >60 mL/min (ref >60)
Glucose, Bld: 163 mg/dL — ABNORMAL HIGH (ref 70–99)
Potassium: 3.1 mmol/L — ABNORMAL LOW (ref 3.5–5.1)
Sodium: 136 mmol/L (ref 135–145)
Total Bilirubin: 0.5 mg/dL (ref 0.3–1.2)
Total Protein: 7.2 g/dL (ref 6.5–8.1)

## 2020-09-03 LAB — LACTATE DEHYDROGENASE: LDH: 210 U/L — ABNORMAL HIGH (ref 98–192)

## 2020-09-03 LAB — FERRITIN: Ferritin: 69 ng/mL (ref 24–336)

## 2020-09-03 LAB — PROTIME-INR
INR: 1.3 — ABNORMAL HIGH (ref 0.8–1.2)
Prothrombin Time: 16 seconds — ABNORMAL HIGH (ref 11.4–15.2)

## 2020-09-03 MED ORDER — ENOXAPARIN SODIUM 40 MG/0.4ML ~~LOC~~ SOLN: 40.0000 mg | Freq: Two times a day (BID) | SUBCUTANEOUS | 0 refills | Status: DC

## 2020-09-03 MED ORDER — ENOXAPARIN SODIUM 40 MG/0.4ML ~~LOC~~ SOLN: 40.0000 mg | SUBCUTANEOUS | 0 refills | Status: DC

## 2020-09-03 MED ORDER — SPIRONOLACTONE 25 MG PO TABS: 25.0000 mg | ORAL_TABLET | Freq: Every day | ORAL | 6 refills | Status: DC

## 2020-09-03 MED ORDER — POTASSIUM CHLORIDE CRYS ER 20 MEQ PO TBCR: 20.0000 meq | EXTENDED_RELEASE_TABLET | Freq: Every day | ORAL | 3 refills | Status: DC

## 2020-09-03 NOTE — Addendum Note (Signed)
Addended by: Evon Slack on: 09/03/2020 12:28 PM   Modules accepted: Orders

## 2020-09-03 NOTE — Progress Notes (Signed)
Patient presents for 2 mo f/u in VAD clinic today alone.  Pt called the VAD pager last night stating that his controller was beeping and said backup battery fault.   Pt reports he has been feeling "great".  Denies ETOH use. Continues smoking 3-5 cigarettes daily. Pt states that he does have a little bit of stomach pain today.  Pt reports that he has not taken any Torsemide since his last visit.  Vital Signs: Doppler Pressure: 112 Automatc BP: 126/89 (103) HR: 95 SPO2: 97 % RA  Weight: 210.4 lbs w/ eqt Last wt: 212.2lbs w/ equip   VAD Indication: Destination Therapy   VAD interrogation & Equipment Management: Speed: 6500 Flow: 5.9 Power: 5.9 w    PI: 3.3 Hct: 40  Alarms: few low voltage Events: 100+ today  Primary Controller:  Backup battery fault. Replaced backup battery with [id number]. Replace back up battery in 31 months Back up controller:  Expired. Replaced [id number]. Replace back up battery in 29 months  Annual Equipment Maintenance on UBC/PM was performed 09/03/20  I reviewed the LVAD parameters from today and compared the results to the patient's prior recorded data.  LVAD interrogation was NEGATIVE for significant power changes, NEGATIVE for clinical alarms and STABLE for PI events/speed drops. No programming changes were made and pump is functioning within specified parameters.   Exit Site Care: Drive line is being maintained weekly by VAD coordinators. Existing VAD dressing removed and site care performed using sterile technique. Drive line exit site cleaned with Chlora prep applicators x 2, RINSED WITH SALINE allowed to dry, and Sorbaview dressing withOUT biopatch reapplied. Anchor reapplied. Covered with 2 large tegaderms. Drive line exit site incorporated, the velour is fully implanted at exit site. No drainage, erythema, tenderness, or foul odor noted.  Pt denies fever or chills. Continue weekly dressing changes. VAD coordinators to perform weekly dressing  changes. Pt has sufficient dressing kits at home.  Device:none  BP & Labs:  Doppler 112 - reflecting MAP  Hgb 13.9 - No S/S of bleeding. Specifically denies melena/BRBPR or nosebleeds.  LDH 210 - established baseline of 200-300. Denies tea-colored urine. No power elevations noted on interrogation.   Batteries Manufacture Date: Number of uses: Re-calibration  2018 297 Performed by patient   Annual maintenance completed per [employer] on patient's home power module and Warehouse manager.    Backup system controller 11 volt battery charged during visit.   3 year Intermacs follow up completed including:  Quality of Life, KCCQ-12, and Neurocognitive trail making.   Pt completed 1500 feet during 6 minute walk.  Pt was given 2 new sets of batteries today.  [facility] Cardiomyopathy Questionnaire  KCCQ-12 09/03/2020 03/12/2020  1 a. Ability to shower/bathe Moderately limited Slightly limited  1 b. Ability to walk 1 block Not at all limited Slightly limited  1 c. Ability to hurry/jog Quite a bit limited Moderately limited  2. Edema feet/ankles/legs Never over the past 2 weeks Never over the past 2 weeks  3. Limited by fatigue 1-2 times a week 1-2 times a week  4. Limited by dyspnea Less than once a week 3+ times a week, not every day  5. Sitting up / on 3+ pillows Never over the past 2 weeks Never over the past 2 weeks  6. Limited enjoyment of life Moderately limited Slightly limited  7. Rest of life w/ symptoms Mostly satisfied Somewhat satisfied  8 a. Participation in hobbies Slightly limited Moderately limited  8 b. Participation in chores  Moderately limited Moderately limited  8 c. Visiting family/friends N/A, did not do for other reasons Moderately limited     Patient Instructions:  1. Increase Spironolactone to 25 mg daily (full tablet) 2. Start Potassium 20 mEq daily, take 2 pills on the day you start this prescription, then resume one pill daily. 3. Based on pts  anemia panel today he will need 1 dose of IV Fereheme. Will schedule this for next week 4. Return to clinic next week for a dressing change/BMET, INR and in 2 months for a visit with Dr. Yevonne Pax RN VAD Coordinator  Office: [phone]  24/7 Pager: [phone]

## 2020-09-03 NOTE — Patient Instructions (Signed)
1. No change in medications 2. Return to clinic next week for a dressing change and in 2 months for a visit with Dr. [name]

## 2020-09-03 NOTE — Progress Notes (Signed)
LVAD INR 

## 2020-09-06 NOTE — Progress Notes (Signed)
VAD Clinic Note   Date:  09/06/2020   ID:  Beaumont, Austad [birth year], MRN [id number]  Location: Home  Provider location: [place] [facility] Type of Visit: Established patient  PCP: [name]  Cardiologist:  Arvilla Meres, MD Primary HF: Dr [name]   Chief Complaint: Heart Failure/LVAD  History of Present Illness:  DEV DHONDT is a 50 y.o. 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  Admitted 4/20 with volume overload and anemia. Diuresed with IV lasix and transitioned to torsemide 20 mg daily. GI consulted EGD with gastritis and nonbleeding duodenal ulcers, colonoscopy with 5 polyps removed.  Admitted 12/20 for DL infection with overlying cellulitis and volume overload.   Admitted 4/21 for DL infection with large subxiphoid abscess. Underwent several debridements and eventually a pec muscle flap. Wound cx + MSSA. Initially on ancef and rifabutin but expanded to cefipime then changed to IV ancef and po rifabutin. Now on oral keflex.  Readmitted 6/21 with superficial abscess and tachypnea. Superficial abscess I&D'd and packed. CT scan done and showed possible deeper fluid collection. Reviewed with TCTS who felt it was muscle flap. Sent to IR for possible drainage but u/s confirmed it was the muscle flap and no drainable fluid collection. Wound cx negative. ID saw and recommended continuing Keflex.   Admitted 8/21 with lactic acidosis due to cardiogenic shock. Started on 0.25 of milrinone and NE. Ramp echo on 8/13 with severe RV dysfunction. Speed increased 5600->6300. Tolerated ok. Repeat ramp echo on 8/20 speed increased to 6500. Diuresed over 40 pounds.   Admitted 10/21 with  ETOH pancreatitis. Gallbladder ok. Resolved with conservative management.   Here for unscheduled visit due to back-up battery fault. Feels good. Going to the gym 1-2x/week.  Denies undue SOB.  Denies ETOH use. Smoking 3-5 cigarettes daily. Denies orthopnea or PND. No fevers, chills or problems with driveline. No bleeding, melena or neuro symptoms. No VAD alarms. Taking all meds as prescribed. Has not used any torsemide,   VAD Indication: Destination Therapy  VAD interrogation & Equipment Management: Speed: 6500 Flow: 5.9 Power: 5.9 w PI: 3.3 Hct: 40  Alarms: few low voltage Events: 100+ today  Primary Controller: Backup battery fault. Replaced backup battery with [id number]. Replace back up battery in 31 months Back up controller: Expired. Replaced [id number].Replace back up battery in 29 months  Annual Equipment Maintenance on UBC/PM was performed4/20/22  Past Medical History:  Diagnosis Date  . Asthma   . CHF (congestive heart failure) (HCC)    a. 09/2016: EF 20-25% with cath showing normal cors  . GERD (gastroesophageal reflux disease)   . History of hiatal hernia   . LVAD (left ventricular assist device) present (HCC)   . OSA on CPAP 09/06/2018   Severe OSA with AHI 68/hr on CPAP at 12cm H2O   Past Surgical History:  Procedure Laterality Date  . APPLICATION OF A-CELL OF CHEST/ABDOMEN N/A 08/24/2019   Procedure: Application Of A-Cell Of Chest/Abdomen;  Surgeon: Kerin Perna, MD;  Location: [facility] OR;  Service: Thoracic;  Laterality: N/A;  . APPLICATION OF A-CELL OF CHEST/ABDOMEN N/A 09/05/2019   Procedure: Application Of A-Cell Of Chest/Abdomen;  Surgeon: Peggye Form, DO;  Location: [facility] OR;  Service: Plastics;  Laterality: N/A;  . APPLICATION OF A-CELL OF CHEST/ABDOMEN  09/03/2019   Procedure: Application Of A-Cell Of Chest/Abdomen;  Surgeon: Kerin Perna,  MD;  Location: [facility] OR;  Service: Thoracic;;  . APPLICATION OF WOUND VAC N/A 08/22/2019   Procedure: Debridement, Irrigation and Packing of Abdominal Incision.;  Surgeon: Kerin Perna, MD;  Location: [facility] OR;  Service: Thoracic;  Laterality: N/A;  . APPLICATION OF WOUND VAC  N/A 08/24/2019   Procedure: Irrigation and Debridement with WOUND VAC APPLICATION;  Surgeon: Kerin Perna, MD;  Location: [facility] OR;  Service: Thoracic;  Laterality: N/A;  . APPLICATION OF WOUND VAC N/A 08/30/2019   Procedure: APPLICATION OF WOUND VAC;  Surgeon: Kerin Perna, MD;  Location: [facility] OR;  Service: Thoracic;  Laterality: N/A;  . APPLICATION OF WOUND VAC N/A 09/03/2019   Procedure: WOUND VAC CHANGE;  Surgeon: Kerin Perna, MD;  Location: [facility] OR;  Service: Thoracic;  Laterality: N/A;  . BIOPSY  08/23/2018   Procedure: BIOPSY;  Surgeon: Lemar Lofty., MD;  Location: [facility] ENDOSCOPY;  Service: Gastroenterology;;  . COLONOSCOPY N/A 08/23/2018   Procedure: COLONOSCOPY;  Surgeon: Lemar Lofty., MD;  Location: [facility] ENDOSCOPY;  Service: Gastroenterology;  Laterality: N/A;  . ENTEROSCOPY N/A 08/23/2018   Procedure: ENTEROSCOPY;  Surgeon: Meridee Score Netty Starring., MD;  Location: [facility] ENDOSCOPY;  Service: Gastroenterology;  Laterality: N/A;  . HEMOSTASIS CLIP PLACEMENT  08/23/2018   Procedure: HEMOSTASIS CLIP PLACEMENT;  Surgeon: Lemar Lofty., MD;  Location: [facility] ENDOSCOPY;  Service: Gastroenterology;;  . IABP INSERTION N/A 09/05/2017   Procedure: IABP INSERTION;  Surgeon: Dolores Patty, MD;  Location: [facility] INVASIVE CV LAB;  Service: Cardiovascular;  Laterality: N/A;  . INSERTION OF IMPLANTABLE LEFT VENTRICULAR ASSIST DEVICE N/A 09/06/2017   Procedure: INSERTION OF IMPLANTABLE LEFT VENTRICULAR ASSIST DEVICE - HM3;  Surgeon: Kerin Perna, MD;  Location: [facility] OR;  Service: Open Heart Surgery;  Laterality: N/A;  HM3  . MUSCLE FLAP CLOSURE N/A 09/05/2019   Procedure: MUSCLE FLAP CLOSURE;  Surgeon: Peggye Form, DO;  Location: [facility] OR;  Service: Plastics;  Laterality: N/A;  . NASAL FRACTURE SURGERY  1987  . POLYPECTOMY  08/23/2018   Procedure: POLYPECTOMY;  Surgeon: Mansouraty, Netty Starring., MD;  Location: [facility] ENDOSCOPY;  Service: Gastroenterology;;  . RIGHT HEART CATH N/A  08/30/2017   Procedure: RIGHT HEART CATH;  Surgeon: Dolores Patty, MD;  Location: [facility] INVASIVE CV LAB;  Service: Cardiovascular;  Laterality: N/A;  . RIGHT/LEFT HEART CATH AND CORONARY ANGIOGRAPHY N/A 10/08/2016   Procedure: Right/Left Heart Cath and Coronary Angiography;  Surgeon: Orpah Cobb, MD;  Location: [facility] INVASIVE CV LAB;  Service: Cardiovascular;  Laterality: N/A;  . STERNAL WOUND DEBRIDEMENT N/A 08/20/2019   Procedure: DEBRIDEMENT OF LVAD DRIVELINE TUNNEL;  Surgeon: Kerin Perna, MD;  Location: [facility] OR;  Service: Thoracic;  Laterality: N/A;  . STERNAL WOUND DEBRIDEMENT N/A 09/05/2019   Procedure: DEBRIDEMENT AND CLOSURE OF ABDOMINAL WOUND;  Surgeon: Peggye Form, DO;  Location: [facility] OR;  Service: Plastics;  Laterality: N/A;  . SUBMUCOSAL TATTOO INJECTION  08/23/2018   Procedure: SUBMUCOSAL TATTOO INJECTION;  Surgeon: Lemar Lofty., MD;  Location: [facility] ENDOSCOPY;  Service: Gastroenterology;;  . TEE WITHOUT CARDIOVERSION N/A 09/06/2017   Procedure: TRANSESOPHAGEAL ECHOCARDIOGRAM (TEE);  Surgeon: Donata Clay, Theron Arista, MD;  Location: [facility] OR;  Service: Open Heart Surgery;  Laterality: N/A;  . WOUND DEBRIDEMENT N/A 08/30/2019   Procedure: Debridement Abdominal Wound;  Surgeon: Kerin Perna, MD;  Location: [facility] OR;  Service: Thoracic;  Laterality: N/A;     Current Outpatient Medications  Medication Sig Dispense Refill  . cephALEXin (KEFLEX) 500 MG  capsule Take 1 capsule (500 mg total) by mouth 3 (three) times daily. 90 capsule 11  . DULoxetine (CYMBALTA) 60 MG capsule Take 1 capsule (60 mg total) by mouth daily. 30 capsule 6  . gabapentin (NEURONTIN) 300 MG capsule TAKE ONE CAPSULE BY MOUTH THREE TIMES DAILY (Patient taking differently: Take 300 mg by mouth 3 (three) times daily.) 90 capsule 6  . hydrOXYzine (ATARAX/VISTARIL) 25 MG tablet Take 2 tablets (50 mg total) by mouth at bedtime as needed for anxiety. (Patient taking differently: Take 50 mg by mouth at bedtime as needed  (sleep).) 60 tablet 11  . nicotine (NICODERM CQ - DOSED IN MG/24 HOURS) 21 mg/24hr patch Place 1 patch (21 mg total) onto the skin daily. 28 patch 3  . pantoprazole (PROTONIX) 40 MG tablet Take 1 tablet (40 mg total) by mouth 2 (two) times daily. 30 tablet 6  . potassium chloride SA (KLOR-CON M20) 20 MEQ tablet Take 1 tablet (20 mEq total) by mouth daily. 90 tablet 3  . sacubitril-valsartan (ENTRESTO) 49-51 MG Take 1 tablet by mouth 2 (two) times daily. 60 tablet 3  . sildenafil (REVATIO) 20 MG tablet Take 1 tablet (20 mg total) by mouth 3 (three) times daily. 90 tablet 11  . warfarin (COUMADIN) 5 MG tablet Take 5 mg (1 tab) every Monday, Wed, and Friday and 2.5 mg (0.5 tab) all other days or as directed by HF Clinic. 180 tablet 3  . acetaminophen (TYLENOL) 500 MG tablet Take 1,500 mg by mouth 3 (three) times daily as needed for headache (pain). (Patient not taking: No sig reported)    . albuterol (PROAIR HFA) 108 (90 Base) MCG/ACT inhaler Inhale 2 puffs into the lungs every 6 (six) hours as needed for wheezing or shortness of breath. (Patient not taking: No sig reported) 1 each 6  . docusate sodium (STOOL SOFTENER) 100 MG capsule Take 1 capsule (100 mg total) by mouth 2 (two) times daily. (Patient not taking: No sig reported) 60 capsule 11  . enoxaparin (LOVENOX) 40 MG/0.4ML injection Inject 0.4 mLs (40 mg total) into the skin daily. 4 mL 0  . spironolactone (ALDACTONE) 25 MG tablet Take 1 tablet (25 mg total) by mouth daily. 30 tablet 6  . torsemide (DEMADEX) 20 MG tablet Take 1 tablet (20 mg total) by mouth every other day. (Patient not taking: No sig reported) 90 tablet 3   No current facility-administered medications for this encounter.    Allergies:   Patient has no known allergies.   Social History:  The patient  reports that he has quit smoking. His smoking use included cigarettes. He has a 12.50 pack-year smoking history. He has never used smokeless tobacco. He reports previous alcohol  use. He reports previous drug use. Drug: Marijuana.   Family History:  The patient's family history includes Alcohol abuse in his cousin; Hypertension in his father.   ROS:  Please see the history of present illness.   All other systems are personally reviewed and negative.    Vital Signs: Doppler Pressure: 112 Automatc BP: 126/89 (103) HR: 95 SPO2: 97 % RA  Weight: 210.4 lbs w/ eqt Last wt: 212.2lbs w/ equip   Exam: General:  NAD.  HEENT: normal  Neck: supple. JVP not elevated.  Carotids 2+ bilat; no bruits. No lymphadenopathy or thryomegaly appreciated. Cor: LVAD hum.  Lungs: Clear. Abdomen: obese soft, nontender, non-distended. No hepatosplenomegaly. No bruits or masses. Good bowel sounds. Driveline site clean. Anchor in place.  Extremities: no cyanosis, clubbing,  rash. Warm trace edema  Neuro: alert & oriented x 3. No focal deficits. Moves all 4 without problem     Recent Labs: 03/15/2020: Magnesium 1.8 09/03/2020: ALT 12; BUN 8; Creatinine, Ser 0.97; Hemoglobin 13.9; Platelets 204; Potassium 3.1; Sodium 136    Wt Readings from Last 3 Encounters:  09/03/20 95.4 kg (210 lb 6.4 oz)  06/26/20 96.3 kg (212 lb 3.2 oz)  03/26/20 88.8 kg (195 lb 12.8 oz)      ASSESSMENT AND PLAN:  1. 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 Treated with milrinone and then weaned off.  Diuresed 40 pounds. VAD speed increased 5600 -> 6300 - Doing very well NYHA I-II - Volume status ok to mildly elevated. Increase spiro to 25. Take torsemide as needed   2.  ETOH pancreatitis, 11/21 - resolved.  - says he has quit drinking (was stressed about his son) - no change  3. Recurrent DL infection with subxiphoid abscess - s/p debridement and pec flap in 4/21 - remains on keflex. Site looks good - He is not a candidate for pump exchange given severe RV dysfunction and poor self-care  4. HM3  LVAD 09/06/2017.  - VAD interrogated personally. Parameters stable. - LDH 210 - INR 1.3 goal 2.0-2.5  Discussed dosing with PharmD personally. - Off ASA with PUD.  - hgb 13.9 - Back-up batteries replaced  5.  Essential HTN - MAPs are up. Increase spiro  7. OSA - sleep study with very severe OSA (AHI 69/hr) - continues to refuse therapy. We have arranged f/u in sleep clinic several times but he has not been compliant - He has lost weight and have offered retesting but he continue to refuse  8. H/o GI bleed  - 08/2018 EGD completed and showed gastritis and nonbleeding duodenal ulcers, colonoscopy with 5 polyps removed. Protonix was increased to BID.  - No recent GI bleeding - hgb 13.9 - He will stay off aspirin for now.   8. Tobacco Abuse  - continues to smoke a few cigs/day - reinforced need to quit  9. Elevated bilirubin and low pre-albumin - suspected related to ETOH use and RV failure +/- fatty liver  10. Hypokalemia - increasing spiro. Increase K  Total time spent 35 minutes. Over half that time spent discussing above.    Signed, Arvilla Meres, MD  09/06/2020 3:08 PM  [facility] [address] [facility] [place] [address] [phone] (office) [phone] (fax)

## 2020-09-09 NOTE — Discharge Instructions (Signed)
Ferumoxytol injection What is this medicine? FERUMOXYTOL is an iron complex. Iron is used to make healthy red blood cells, which carry oxygen and nutrients throughout the body. This medicine is used to treat iron deficiency anemia. This medicine may be used for other purposes; ask your health care provider or pharmacist if you have questions. COMMON BRAND NAME(S): Feraheme What should I tell my health care provider before I take this medicine? They need to know if you have any of these conditions:  anemia not caused by low iron levels  high levels of iron in the blood  magnetic resonance imaging (MRI) test scheduled  an unusual or allergic reaction to iron, other medicines, foods, dyes, or preservatives  pregnant or trying to get pregnant  breast-feeding How should I use this medicine? This medicine is for injection into a vein. It is given by a health care professional in a hospital or clinic setting. Talk to your pediatrician regarding the use of this medicine in children. Special care may be needed. Overdosage: If you think you have taken too much of this medicine contact a poison control center or emergency room at once. NOTE: This medicine is only for you. Do not share this medicine with others. What if I miss a dose? It is important not to miss your dose. Call your doctor or health care professional if you are unable to keep an appointment. What may interact with this medicine? This medicine may interact with the following medications:  other iron products This list may not describe all possible interactions. Give your health care provider a list of all the medicines, herbs, non-prescription drugs, or dietary supplements you use. Also tell them if you smoke, drink alcohol, or use illegal drugs. Some items may interact with your medicine. What should I watch for while using this medicine? Visit your doctor or healthcare professional regularly. Tell your doctor or healthcare  professional if your symptoms do not start to get better or if they get worse. You may need blood work done while you are taking this medicine. You may need to follow a special diet. Talk to your doctor. Foods that contain iron include: whole grains/cereals, dried fruits, beans, or peas, leafy green vegetables, and organ meats (liver, kidney). What side effects may I notice from receiving this medicine? Side effects that you should report to your doctor or health care professional as soon as possible:  allergic reactions like skin rash, itching or hives, swelling of the face, lips, or tongue  breathing problems  changes in blood pressure  feeling faint or lightheaded, falls  fever or chills  flushing, sweating, or hot feelings  swelling of the ankles or feet Side effects that usually do not require medical attention (report to your doctor or health care professional if they continue or are bothersome):  diarrhea  headache  nausea, vomiting  stomach pain This list may not describe all possible side effects. Call your doctor for medical advice about side effects. You may report side effects to FDA at 1-800-FDA-1088. Where should I keep my medicine? This drug is given in a hospital or clinic and will not be stored at home. NOTE: This sheet is a summary. It may not cover all possible information. If you have questions about this medicine, talk to your doctor, pharmacist, or health care provider.  2021 Elsevier/Gold Standard (2016-06-21 20:21:10)  

## 2020-09-10 ENCOUNTER — Inpatient Hospital Stay (HOSPITAL_COMMUNITY)
Admission: RE | Admit: 2020-09-10 | Discharge: 2020-09-10 | Disposition: A | Discharge status: Home / Self Care | Payer: Medicare Other | Source: Ambulatory Visit | Attending: Internal Medicine | Admitting: Internal Medicine

## 2020-09-10 ENCOUNTER — Ambulatory Visit (HOSPITAL_COMMUNITY)
Admission: RE | Admit: 2020-09-10 | Discharge: 2020-09-10 | Disposition: A | Discharge status: Home / Self Care | Payer: Medicare Other | Source: Ambulatory Visit | Attending: Internal Medicine | Admitting: Internal Medicine

## 2020-09-10 ENCOUNTER — Encounter (HOSPITAL_COMMUNITY): Payer: Self-pay

## 2020-09-10 ENCOUNTER — Other Ambulatory Visit: Payer: Self-pay

## 2020-09-10 DIAGNOSIS — Z95811 Presence of heart assist device: Secondary | ICD-10-CM | POA: Insufficient documentation

## 2020-09-10 DIAGNOSIS — Z4801 Encounter for change or removal of surgical wound dressing: Secondary | ICD-10-CM | POA: Insufficient documentation

## 2020-09-10 DIAGNOSIS — Z7901 Long term (current) use of anticoagulants: Secondary | ICD-10-CM | POA: Diagnosis not present

## 2020-09-10 LAB — BASIC METABOLIC PANEL
Anion gap: 9 (ref 5–15)
BUN: 7 mg/dL (ref 6–20)
CO2: 23 mmol/L (ref 22–32)
Calcium: 8.7 mg/dL — ABNORMAL LOW (ref 8.9–10.3)
Chloride: 104 mmol/L (ref 98–111)
Creatinine, Ser: 0.77 mg/dL (ref 0.61–1.24)
GFR, Estimated: >60 mL/min (ref >60)
Glucose, Bld: 105 mg/dL — ABNORMAL HIGH (ref 70–99)
Potassium: 3.7 mmol/L (ref 3.5–5.1)
Sodium: 136 mmol/L (ref 135–145)

## 2020-09-10 NOTE — Progress Notes (Signed)
Patient presents for dressing change in VAD clinic today alone. Denies any issues with his VAD or alarms.   Exit Site Care: Drive line is being maintained weekly by VAD coordinators. Existing VAD dressing removed and site care performed using sterile technique. Drive line exit site cleaned with Chlora prep applicators x 2, and rinsed with saline, allowed to dry, and Sorbaview dressing with biopatch reapplied. Anchor reapplied. Drive line exit site incorporated, the velour is fully implanted at exit site. No drainage, erythema, tenderness, or foul odor noted.  Pt denies fever or chills. Continue weekly dressing changes. VAD coordinators to perform weekly dressing changes. Patient has sufficient dressing kits at home.  Pt had BMET drawn today to reassess his K+ after increasing Cleda Daub and starting potassium pill. pts K was 3.7 today. INR was apparently d/c'd we will get an INR next week.  Return to clinic in 1 week for INR and dressing change.    Carlton Adam RN VAD Coordinator  Office: [phone]  24/7 Pager: [phone]

## 2020-09-11 ENCOUNTER — Other Ambulatory Visit (HOSPITAL_COMMUNITY): Payer: Self-pay | Admitting: *Deleted

## 2020-09-11 DIAGNOSIS — Z95811 Presence of heart assist device: Secondary | ICD-10-CM

## 2020-09-11 DIAGNOSIS — Z7901 Long term (current) use of anticoagulants: Secondary | ICD-10-CM

## 2020-09-15 ENCOUNTER — Other Ambulatory Visit (HOSPITAL_COMMUNITY): Payer: Self-pay | Admitting: Internal Medicine

## 2020-09-15 DIAGNOSIS — R52 Pain, unspecified: Secondary | ICD-10-CM

## 2020-09-15 DIAGNOSIS — Z95811 Presence of heart assist device: Secondary | ICD-10-CM

## 2020-09-15 DIAGNOSIS — G629 Polyneuropathy, unspecified: Secondary | ICD-10-CM

## 2020-09-17 ENCOUNTER — Other Ambulatory Visit (HOSPITAL_COMMUNITY): Payer: Medicare Other

## 2020-09-24 ENCOUNTER — Ambulatory Visit (HOSPITAL_COMMUNITY)
Admission: RE | Admit: 2020-09-24 | Discharge: 2020-09-24 | Disposition: A | Discharge status: Home / Self Care | Payer: Medicare Other | Source: Ambulatory Visit | Attending: Internal Medicine | Admitting: Internal Medicine

## 2020-09-24 ENCOUNTER — Other Ambulatory Visit: Payer: Self-pay

## 2020-09-24 ENCOUNTER — Ambulatory Visit (HOSPITAL_COMMUNITY): Payer: Self-pay

## 2020-09-24 DIAGNOSIS — Z95811 Presence of heart assist device: Secondary | ICD-10-CM | POA: Diagnosis not present

## 2020-09-24 DIAGNOSIS — Z7901 Long term (current) use of anticoagulants: Secondary | ICD-10-CM | POA: Diagnosis not present

## 2020-09-24 DIAGNOSIS — Z4801 Encounter for change or removal of surgical wound dressing: Secondary | ICD-10-CM | POA: Insufficient documentation

## 2020-09-24 LAB — PROTIME-INR
INR: 1.8 — ABNORMAL HIGH (ref 0.8–1.2)
Prothrombin Time: 21.2 seconds — ABNORMAL HIGH (ref 11.4–15.2)

## 2020-09-24 NOTE — Progress Notes (Signed)
LVAD INR 

## 2020-09-24 NOTE — Progress Notes (Signed)
Patient presents for dressing change in VAD clinic today with daughter. Denies any issues with his VAD or alarms.   Exit Site Care: Drive line is being maintained weekly by VAD coordinators. Existing VAD dressing removed and site care performed using sterile technique. Drive line exit site cleaned with Chlora prep applicators x 2, and rinsed with saline, allowed to dry, and Sorbaview dressing with biopatch reapplied. Anchor reapplied. Drive line exit site incorporated, the velour is fully implanted at exit site. No drainage, erythema, tenderness, or foul odor noted.  Pt denies fever or chills. Continue weekly dressing changes. VAD coordinators to perform weekly dressing changes. Patient provided with 3 weekly dressing kits and asked to bring one to each clinic visit.   Return to clinic in 1 week for INR and dressing change.    Hessie Diener RN VAD Coordinator  Office: [phone]  24/7 Pager: [phone]

## 2020-09-26 ENCOUNTER — Other Ambulatory Visit (HOSPITAL_COMMUNITY): Payer: Self-pay | Admitting: Unknown Physician Specialty

## 2020-09-26 DIAGNOSIS — Z95811 Presence of heart assist device: Secondary | ICD-10-CM

## 2020-09-26 DIAGNOSIS — Z7901 Long term (current) use of anticoagulants: Secondary | ICD-10-CM

## 2020-10-01 ENCOUNTER — Other Ambulatory Visit (HOSPITAL_COMMUNITY): Payer: Medicare Other

## 2020-10-02 ENCOUNTER — Other Ambulatory Visit (HOSPITAL_COMMUNITY): Payer: Medicare Other

## 2020-10-03 ENCOUNTER — Other Ambulatory Visit (HOSPITAL_COMMUNITY): Payer: Self-pay | Admitting: *Deleted

## 2020-10-03 DIAGNOSIS — Z7901 Long term (current) use of anticoagulants: Secondary | ICD-10-CM

## 2020-10-03 DIAGNOSIS — Z95811 Presence of heart assist device: Secondary | ICD-10-CM

## 2020-10-08 ENCOUNTER — Ambulatory Visit (HOSPITAL_COMMUNITY): Payer: Self-pay

## 2020-10-08 ENCOUNTER — Other Ambulatory Visit: Payer: Self-pay

## 2020-10-08 ENCOUNTER — Ambulatory Visit (HOSPITAL_COMMUNITY)
Admission: RE | Admit: 2020-10-08 | Discharge: 2020-10-08 | Disposition: A | Discharge status: Home / Self Care | Payer: Medicare Other | Source: Ambulatory Visit | Attending: Internal Medicine | Admitting: Internal Medicine

## 2020-10-08 DIAGNOSIS — Z95811 Presence of heart assist device: Secondary | ICD-10-CM | POA: Diagnosis present

## 2020-10-08 DIAGNOSIS — Z7901 Long term (current) use of anticoagulants: Secondary | ICD-10-CM

## 2020-10-08 LAB — PROTIME-INR
INR: 1.5 — ABNORMAL HIGH (ref 0.8–1.2)
Prothrombin Time: 18.3 seconds — ABNORMAL HIGH (ref 11.4–15.2)

## 2020-10-08 NOTE — Progress Notes (Signed)
LVAD INR 

## 2020-10-08 NOTE — Progress Notes (Signed)
Patient presents for dressing change in VAD clinic today alone. Denies any issues with his VAD or alarms.   Exit Site Care: Drive line is being maintained weekly by VAD coordinators. Existing VAD dressing removed and site care performed using sterile technique. Drive line exit site cleaned with Chlora prep applicators x 2, and rinsed with saline, allowed to dry, and Sorbaview dressing with biopatch reapplied. Anchor reapplied. Drive line exit site incorporated, the velour is fully implanted at exit site. No drainage, erythema, tenderness, or foul odor noted.  Pt denies fever or chills. Continue weekly dressing changes. VAD coordinators to perform weekly dressing changes. Patient has sufficient weekly dressing kits at home and asked to bring one to each clinic visit.   Pt is c/o today of cough and head congestion. Lungs are clear. Temp: 98.4. pt provided with handout of cold/cough medications that he can/cannot take. Pt denies any known exposure to covid. Does not want to take a covid test. Pt states that he has being feeling ill for 3 days. Will d/w Dr. [name].  Return to clinic in 10 days for dressing change.    Carlton Adam RN VAD Coordinator  Office: [phone]  24/7 Pager: [phone]

## 2020-10-17 ENCOUNTER — Ambulatory Visit (HOSPITAL_COMMUNITY): Payer: Self-pay

## 2020-10-17 ENCOUNTER — Other Ambulatory Visit: Payer: Self-pay

## 2020-10-17 ENCOUNTER — Ambulatory Visit (HOSPITAL_COMMUNITY)
Admission: RE | Admit: 2020-10-17 | Discharge: 2020-10-17 | Disposition: A | Discharge status: Home / Self Care | Payer: Medicare Other | Source: Ambulatory Visit | Attending: Cardiology | Admitting: Cardiology

## 2020-10-17 DIAGNOSIS — Z95811 Presence of heart assist device: Secondary | ICD-10-CM | POA: Insufficient documentation

## 2020-10-17 DIAGNOSIS — Z7901 Long term (current) use of anticoagulants: Secondary | ICD-10-CM | POA: Diagnosis not present

## 2020-10-17 LAB — PROTIME-INR
INR: 1.8 — ABNORMAL HIGH (ref 0.8–1.2)
Prothrombin Time: 21.2 seconds — ABNORMAL HIGH (ref 11.4–15.2)

## 2020-10-17 NOTE — Progress Notes (Signed)
LVAD INR 

## 2020-10-17 NOTE — Progress Notes (Signed)
Patient presents for dressing change in VAD clinic today alone. Denies any issues with his VAD or alarms.   Exit Site Care: Drive line is being maintained weekly by VAD coordinators. Existing VAD dressing removed and site care performed using sterile technique. Drive line exit site cleaned with Chlora prep applicators x 2, and rinsed with saline, allowed to dry, and Sorbaview dressing with biopatch reapplied. Anchor reapplied. Drive line exit site incorporated, the velour is fully implanted at exit site. No drainage, erythema, tenderness, or foul odor noted.  Pt denies fever or chills. Continue weekly dressing changes. VAD coordinators to perform weekly dressing changes. Patient was given 4 weekly dressing kits and asked to bring one to each clinic visit.   Return to clinic in 10 days for dressing change.    Carlton Adam RN VAD Coordinator  Office: [phone]  24/7 Pager: [phone]

## 2020-10-24 ENCOUNTER — Other Ambulatory Visit (HOSPITAL_COMMUNITY): Payer: Self-pay | Admitting: Unknown Physician Specialty

## 2020-10-24 DIAGNOSIS — Z7901 Long term (current) use of anticoagulants: Secondary | ICD-10-CM

## 2020-10-24 DIAGNOSIS — Z95811 Presence of heart assist device: Secondary | ICD-10-CM

## 2020-10-27 ENCOUNTER — Telehealth (HOSPITAL_COMMUNITY): Payer: Self-pay | Admitting: *Deleted

## 2020-10-27 ENCOUNTER — Other Ambulatory Visit (HOSPITAL_COMMUNITY): Payer: Medicare Other

## 2020-10-27 ENCOUNTER — Other Ambulatory Visit (HOSPITAL_COMMUNITY): Payer: Self-pay | Admitting: *Deleted

## 2020-10-27 NOTE — Telephone Encounter (Signed)
Left voicemail message regarding missed dressing change appointment today. Requested call back to clinic to reschedule.  Alyce Pagan RN VAD Coordinator  Office: [phone]  24/7 Pager: [phone]

## 2020-10-29 ENCOUNTER — Ambulatory Visit (HOSPITAL_COMMUNITY)
Admission: RE | Admit: 2020-10-29 | Discharge: 2020-10-29 | Disposition: A | Discharge status: Home / Self Care | Payer: Medicare Other | Source: Ambulatory Visit | Attending: Cardiology | Admitting: Cardiology

## 2020-10-29 ENCOUNTER — Ambulatory Visit (HOSPITAL_COMMUNITY): Payer: Self-pay

## 2020-10-29 ENCOUNTER — Other Ambulatory Visit: Payer: Self-pay

## 2020-10-29 DIAGNOSIS — Z7901 Long term (current) use of anticoagulants: Secondary | ICD-10-CM | POA: Diagnosis present

## 2020-10-29 DIAGNOSIS — Z95811 Presence of heart assist device: Secondary | ICD-10-CM | POA: Insufficient documentation

## 2020-10-29 LAB — PROTIME-INR
INR: 1.6 — ABNORMAL HIGH (ref 0.8–1.2)
Prothrombin Time: 19.4 seconds — ABNORMAL HIGH (ref 11.4–15.2)

## 2020-10-29 NOTE — Progress Notes (Signed)
LVAD INR 

## 2020-10-29 NOTE — Progress Notes (Signed)
Patient presents for dressing change and INR in VAD clinic today alone. Denies any issues with his VAD or alarms.   Patient admits to going to water park and "going down slide" into water, but "not swimming". Advised patient he cannot go in water due to VAD equipment and drive line. Reviewed water safety precautions with patient with LVAD. Pt verbalized understanding of same.   Exit Site Care: Drive line is being maintained by VAD coordinators. Existing VAD dressing removed and site care performed using sterile technique. Drive line exit site cleaned with Chlora prep applicators x 2, and rinsed with saline, allowed to dry, and Sorbaview dressing with biopatch reapplied. Anchor reapplied and covered with two large tegaderms.  Drive line exit site incorporated, the velour is fully implanted at exit site. No drainage, erythema, tenderness, or foul odor noted.  Pt denies fever or chills. Continue weekly dressing changes. VAD coordinators to perform weekly dressing changes. Patient was given 4 weekly dressing kits and asked to bring one to each clinic visit.   Return to clinic in 10 days for dressing change and INR.    Hessie Diener RN VAD Coordinator  Office: [phone]  24/7 Pager: [phone]

## 2020-11-04 ENCOUNTER — Other Ambulatory Visit (HOSPITAL_COMMUNITY): Payer: Self-pay | Admitting: Unknown Physician Specialty

## 2020-11-04 DIAGNOSIS — Z7901 Long term (current) use of anticoagulants: Secondary | ICD-10-CM

## 2020-11-04 DIAGNOSIS — Z95811 Presence of heart assist device: Secondary | ICD-10-CM

## 2020-11-05 ENCOUNTER — Ambulatory Visit (HOSPITAL_COMMUNITY)
Admission: RE | Admit: 2020-11-05 | Discharge: 2020-11-05 | Disposition: A | Discharge status: Home / Self Care | Payer: Medicare Other | Source: Ambulatory Visit | Attending: Internal Medicine | Admitting: Internal Medicine

## 2020-11-05 ENCOUNTER — Ambulatory Visit (HOSPITAL_COMMUNITY): Payer: Self-pay

## 2020-11-05 ENCOUNTER — Other Ambulatory Visit: Payer: Self-pay

## 2020-11-05 DIAGNOSIS — Z7901 Long term (current) use of anticoagulants: Secondary | ICD-10-CM | POA: Insufficient documentation

## 2020-11-05 DIAGNOSIS — Z95811 Presence of heart assist device: Secondary | ICD-10-CM | POA: Insufficient documentation

## 2020-11-05 DIAGNOSIS — Z5181 Encounter for therapeutic drug level monitoring: Secondary | ICD-10-CM | POA: Insufficient documentation

## 2020-11-05 LAB — PROTIME-INR
INR: 1.6 — ABNORMAL HIGH (ref 0.8–1.2)
Prothrombin Time: 18.8 seconds — ABNORMAL HIGH (ref 11.4–15.2)

## 2020-11-05 NOTE — Progress Notes (Signed)
LVAD INR 

## 2020-11-05 NOTE — Progress Notes (Signed)
Patient presents for dressing change and INR in VAD clinic today alone. Denies any issues with his VAD or alarms.   Exit Site Care: Drive line is being maintained by VAD coordinators. Existing VAD dressing removed and site care performed using sterile technique. Drive line exit site cleaned with Chlora prep applicators x 2, and rinsed with saline, allowed to dry, and Sorbaview dressing with biopatch reapplied. Anchor reapplied and covered with two large tegaderms.  Drive line exit site incorporated, the velour is fully implanted at exit site. No drainage, erythema, tenderness, or foul odor noted.  Pt denies fever or chills. Continue weekly dressing changes. VAD coordinators to perform weekly dressing changes. Patient has adequate dressing supplies at home.  Return to clinic in 10 days for dressing change and INR.    Alyce Pagan RN VAD Coordinator  Office: [phone]  24/7 Pager: [phone]

## 2020-11-07 ENCOUNTER — Other Ambulatory Visit (HOSPITAL_COMMUNITY): Payer: Self-pay | Admitting: Unknown Physician Specialty

## 2020-11-07 DIAGNOSIS — Z7901 Long term (current) use of anticoagulants: Secondary | ICD-10-CM

## 2020-11-07 DIAGNOSIS — Z95811 Presence of heart assist device: Secondary | ICD-10-CM

## 2020-11-14 ENCOUNTER — Ambulatory Visit (HOSPITAL_COMMUNITY): Payer: Self-pay

## 2020-11-14 ENCOUNTER — Other Ambulatory Visit: Payer: Self-pay

## 2020-11-14 ENCOUNTER — Ambulatory Visit (HOSPITAL_COMMUNITY)
Admission: RE | Admit: 2020-11-14 | Discharge: 2020-11-14 | Disposition: A | Discharge status: Home / Self Care | Payer: Medicare Other | Source: Ambulatory Visit | Attending: Internal Medicine | Admitting: Internal Medicine

## 2020-11-14 DIAGNOSIS — Z95811 Presence of heart assist device: Secondary | ICD-10-CM | POA: Diagnosis not present

## 2020-11-14 DIAGNOSIS — Z7901 Long term (current) use of anticoagulants: Secondary | ICD-10-CM | POA: Diagnosis not present

## 2020-11-14 DIAGNOSIS — Z5181 Encounter for therapeutic drug level monitoring: Secondary | ICD-10-CM | POA: Diagnosis present

## 2020-11-14 LAB — PROTIME-INR
INR: 1.4 — ABNORMAL HIGH (ref 0.8–1.2)
Prothrombin Time: 17 seconds — ABNORMAL HIGH (ref 11.4–15.2)

## 2020-11-14 NOTE — Progress Notes (Signed)
LVAD INR 

## 2020-11-14 NOTE — Progress Notes (Signed)
Patient presents for dressing change and INR in VAD clinic today alone. Denies any issues with his VAD or alarms.   Exit Site Care: Drive line is being maintained by VAD coordinators. Existing VAD dressing removed and site care performed using sterile technique. Drive line exit site cleaned with Chlora prep applicators x 2, and rinsed with saline, allowed to dry, and Sorbaview dressing with biopatch reapplied. Anchor reapplied and covered with two large tegaderms.  Drive line exit site incorporated, the velour is fully implanted at exit site. No drainage, erythema, tenderness, or foul odor noted.  Pt denies fever or chills. Continue weekly dressing changes. VAD coordinators to perform weekly dressing changes. Patient has adequate dressing supplies at home.  Return to clinic in 13 days for full visit.  Carlton Adam RN VAD Coordinator  Office: [phone]  24/7 Pager: [phone]

## 2020-11-20 ENCOUNTER — Other Ambulatory Visit (HOSPITAL_COMMUNITY): Payer: Medicare Other

## 2020-11-26 ENCOUNTER — Other Ambulatory Visit (HOSPITAL_COMMUNITY): Payer: Self-pay | Admitting: *Deleted

## 2020-11-26 ENCOUNTER — Telehealth: Payer: Self-pay | Admitting: Internal Medicine

## 2020-11-26 ENCOUNTER — Encounter (HOSPITAL_COMMUNITY): Payer: Self-pay

## 2020-11-26 ENCOUNTER — Other Ambulatory Visit: Payer: Self-pay

## 2020-11-26 ENCOUNTER — Ambulatory Visit (HOSPITAL_COMMUNITY)
Admission: RE | Admit: 2020-11-26 | Discharge: 2020-11-26 | Disposition: A | Discharge status: Home / Self Care | Payer: Medicare Other | Source: Ambulatory Visit | Attending: Internal Medicine | Admitting: Internal Medicine

## 2020-11-26 ENCOUNTER — Ambulatory Visit (HOSPITAL_COMMUNITY): Payer: Self-pay

## 2020-11-26 VITALS — BP 129/98 | HR 98 | Wt 215.8 lb

## 2020-11-26 DIAGNOSIS — I10 Essential (primary) hypertension: Secondary | ICD-10-CM | POA: Diagnosis not present

## 2020-11-26 DIAGNOSIS — Z95811 Presence of heart assist device: Secondary | ICD-10-CM

## 2020-11-26 DIAGNOSIS — Z7901 Long term (current) use of anticoagulants: Secondary | ICD-10-CM

## 2020-11-26 DIAGNOSIS — Z720 Tobacco use: Secondary | ICD-10-CM

## 2020-11-26 DIAGNOSIS — T827XXA Infection and inflammatory reaction due to other cardiac and vascular devices, implants and grafts, initial encounter: Secondary | ICD-10-CM | POA: Diagnosis not present

## 2020-11-26 DIAGNOSIS — I5022 Chronic systolic (congestive) heart failure: Secondary | ICD-10-CM

## 2020-11-26 DIAGNOSIS — Z4509 Encounter for adjustment and management of other cardiac device: Secondary | ICD-10-CM | POA: Insufficient documentation

## 2020-11-26 LAB — CBC
HCT: 41 % (ref 39.0–52.0)
Hemoglobin: 12.9 g/dL — ABNORMAL LOW (ref 13.0–17.0)
MCH: 29.1 pg (ref 26.0–34.0)
MCHC: 31.5 g/dL (ref 30.0–36.0)
MCV: 92.3 fL (ref 80.0–100.0)
Platelets: 185 10*3/uL (ref 150–400)
RBC: 4.44 MIL/uL (ref 4.22–5.81)
RDW: 13.6 % (ref 11.5–15.5)
WBC: 6.7 10*3/uL (ref 4.0–10.5)
nRBC: 0 % (ref 0.0–0.2)

## 2020-11-26 LAB — PROTIME-INR
INR: 1.8 — ABNORMAL HIGH (ref 0.8–1.2)
Prothrombin Time: 20.9 seconds — ABNORMAL HIGH (ref 11.4–15.2)

## 2020-11-26 LAB — BASIC METABOLIC PANEL
Anion gap: 7 (ref 5–15)
BUN: 12 mg/dL (ref 6–20)
CO2: 22 mmol/L (ref 22–32)
Calcium: 8.9 mg/dL (ref 8.9–10.3)
Chloride: 108 mmol/L (ref 98–111)
Creatinine, Ser: 0.84 mg/dL (ref 0.61–1.24)
GFR, Estimated: >60 mL/min (ref >60)
Glucose, Bld: 105 mg/dL — ABNORMAL HIGH (ref 70–99)
Potassium: 3.9 mmol/L (ref 3.5–5.1)
Sodium: 137 mmol/L (ref 135–145)

## 2020-11-26 LAB — LACTATE DEHYDROGENASE: LDH: 183 U/L (ref 98–192)

## 2020-11-26 MED ORDER — SPIRONOLACTONE 25 MG PO TABS: 12.5000 mg | ORAL_TABLET | Freq: Every day | ORAL | 6 refills | Status: DC

## 2020-11-26 MED ORDER — WARFARIN SODIUM 5 MG PO TABS
ORAL_TABLET | ORAL | 6 refills | Status: DC
Start: 2020-11-26 — End: 2020-12-12

## 2020-11-26 MED ORDER — SACUBITRIL-VALSARTAN 97-103 MG PO TABS: 1.0000 | ORAL_TABLET | Freq: Two times a day (BID) | ORAL | 6 refills | Status: DC

## 2020-11-26 NOTE — Patient Instructions (Addendum)
Continue Spiro 12.5 mg daily 2. Increase Entresto to 97-103 mg twice a day. Until you pick up new prescription you may take two of your current tablets twice a day.  3. Coumadin dosing per [facility] 4. Return to VAD clinic in 1 week for dressing change 5. Return to clinic in 2 months for follow up visit with Dr [name] 6. Annice Pih will set you up with [employer]. Call [phone] 1-2 days in advance for rides to appointments

## 2020-11-26 NOTE — Progress Notes (Signed)
Heart and Vascular Care Navigation  11/26/2020  Jonathan Wood 1971/05/12 [id number]  Reason for Referral: Patient needs transportation as unable to afford gas expenses to travel to the clinic.   Engaged with patient face to face for follow up visit for Heart and Vascular Care Coordination.                                                                                                   Assessment:  Patient shared that he is struggling with finances and asked about transportation options.                                     HRT/VAS Care Coordination     Patients Home Cardiology Office Heart Failure Clinic   Outpatient Care Team Social Worker   Social Worker Name: Lasandra Beech, [place] [phone]   Living arrangements for the past 2 months Single Family Home   Lives with: Minor Children  16yo daughter and 9yo son   Patient Current Insurance Coverage Traditional Medicare; Medicaid   Patient Has Concern With Paying Medical Bills No   Does Patient Have Prescription Coverage? Yes   Home Assistive Devices/Equipment Scales; Eyeglasses   DME Agency NA   HH Agency --  [employer]   Current home services Home RN       Social History:                                                                             SDOH Screenings   Alcohol Screen: Low Risk    Last Alcohol Screening Score (AUDIT): 0  Depression (PHQ2-9): Not on file  Financial Resource Strain: Low Risk    Difficulty of Paying Living Expenses: Not hard at all  Food Insecurity: No Food Insecurity   Worried About Programme researcher, broadcasting/film/video in the Last Year: Never true   Ran Out of Food in the Last Year: Never true  Housing: Low Risk    Last Housing Risk Score: 0  Physical Activity: Insufficiently Active   Days of Exercise per Week: 3 days   Minutes of Exercise per Session: 20 min  Social Connections: Not on file  Stress: Not on file  Tobacco Use: Medium Risk   Smoking Tobacco Use: Former   Smokeless Tobacco Use: Never   Transportation Needs: Personal assistant (Medical): Yes   Lack of Transportation (Non-Medical): Yes    SDOH Interventions: Financial Resources:    N/a  Arts administrator Insecurity:     Housing Insecurity:     Transportation:   Transportation Interventions: Retail banker   Follow-up plan: CSW enrolled patient in [employer]. Patient verbalizes understanding of how the program works and provided contact  information to call when transport needed. Lasandra Beech, LCSW, CCSW-MCS [phone]

## 2020-11-26 NOTE — Progress Notes (Addendum)
Patient presents for 2 mo f/u in VAD clinic today alone. Denies any issues with VAD or equipment.   Pt states he has been feeling great. He has been exercising daily. 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 Denies ETOH use. Continues smoking 3-5 cigarettes daily.   Denies lightheadedness, falls, shortness of breath, or signs of bleeding. Reports occasional dizziness when he is exercising. This resolves when he lays down to rest. Encouraged increased PO intake when he is outside in the heat. He verbalized understanding.   Pt reports that he did not increase Spiro to 25 mg and start K as instructed at last visit. He reports he is taking all other medication as prescribed. BP elevated today. Per Dr [name] keep Cleda Daub at 12.5 mg, and increase Entresto to 97-103 mg BID. K 3.9 today; pt does not need to start potassium.   Pt missed scheduled IV fereheme on 09/10/20. Pt would like to have appt rescheduled. Appt scheduled for 12/03/20 @ 12 p. Left voicemail message for pt with appt information.   Vital Signs: Doppler Pressure: 120 Automatc BP: 129/98 (119) HR: 98 SPO2: UTO % RA   Weight: 215.8 lbs w/ eqt Last wt: 210.4 lbs w/ equip   VAD Indication: Destination Therapy    VAD interrogation & Equipment Management: Speed: 6500 Flow: 5.4 Power: 5.8 w    PI: 4.0 Hct: 43  Alarms: few low voltage. 7/11- LOW VOLTAGE & NO EXTERNAL POWERS- son and his friends were running through the house and kept accidentally unplugging MPU Events: 10-30 daily  Primary Controller:  Replace back up battery in 29 months Back up controller: Replace back up battery in 29 months  Annual Equipment Maintenance on UBC/PM was performed 09/03/20   I reviewed the LVAD parameters from today and compared the results to the patient's prior recorded data.  LVAD interrogation was NEGATIVE for significant power changes, NEGATIVE for clinical alarms and STABLE for PI events/speed drops. No  programming changes were made and pump is functioning within specified parameters.   Exit Site Care: Drive line is being maintained weekly by VAD coordinators. Existing VAD dressing removed and site care performed using sterile technique. Drive line exit site cleaned with Chlora prep applicators x 2, RINSED WITH SALINE allowed to dry, and Sorbaview dressing with biopatch reapplied. Anchor reapplied. Covered with 2 large tegaderms. Drive line exit site incorporated, the velour is fully implanted at exit site. No drainage, erythema, tenderness, or foul odor noted.  Pt denies fever or chills. Continue weekly dressing changes. VAD coordinators to perform weekly dressing changes. Pt has sufficient dressing kits at home.  Device: none  BP & Labs:  Doppler 120 - reflecting MAP   Hgb 12.9 - No S/S of bleeding. Specifically denies melena/BRBPR or nosebleeds.   LDH 183 - established baseline of 200-300. Denies tea-colored urine. No power elevations noted on interrogation.   Patient Instructions:  Continue Spiro 12.5 mg daily 2. Increase Entresto to 97-103 mg twice a day. Until you pick up new prescription you may take two of your current tablets twice a day.  3. Coumadin dosing per [facility] PharmD 4. Return to VAD clinic in 1 week for dressing change and BP check 5. Return to clinic in 2 months for follow up visit with Dr [name] 6. Annice Pih will set you up with [employer]. Call [phone] 1-2 days in advance for rides to appointments   Alyce Pagan RN VAD Coordinator  Office: [phone]  24/7 Pager:  [phone]        

## 2020-11-26 NOTE — Progress Notes (Signed)
LVAD INR 

## 2020-11-27 ENCOUNTER — Encounter (HOSPITAL_COMMUNITY): Payer: Medicare Other

## 2020-11-28 ENCOUNTER — Other Ambulatory Visit (HOSPITAL_COMMUNITY): Payer: Self-pay | Admitting: Unknown Physician Specialty

## 2020-11-28 DIAGNOSIS — Z7901 Long term (current) use of anticoagulants: Secondary | ICD-10-CM

## 2020-11-28 DIAGNOSIS — Z95811 Presence of heart assist device: Secondary | ICD-10-CM

## 2020-11-30 NOTE — Progress Notes (Addendum)
VAD Clinic Note   Date:  11/30/2020   ID:  Jonathan Wood, DOB [birth year], MRN [id number]  Location: Home  Provider location: [facility] Type of Visit: Established patient  PCP: [name]  Cardiologist:  Arvilla Meres, MD Primary HF: Dr [name]   Chief Complaint: Heart Failure/LVAD  History of Present Illness:  Jonathan Wood is a 50 y.o. 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  Admitted 4/20 with volume overload and anemia. Diuresed with IV lasix and transitioned to torsemide 20 mg daily. GI consulted EGD with gastritis and nonbleeding duodenal ulcers, colonoscopy with 5 polyps removed.  Admitted 12/20 for DL infection with overlying cellulitis and volume overload.   Admitted 4/21 for DL infection with large subxiphoid abscess. Underwent several debridements and eventually a pec muscle flap. Wound cx + MSSA. Initially on ancef and rifabutin but expanded to cefipime then changed to IV ancef and po rifabutin. Now on oral keflex.  Readmitted 6/21 with superficial abscess and tachypnea. Superficial abscess I&D'd and packed. CT scan done and showed possible deeper fluid collection. Reviewed with TCTS who felt it was muscle flap. Sent to IR for possible drainage but u/s confirmed it was the muscle flap and no drainable fluid collection. Wound cx negative. ID saw and recommended continuing Keflex.   Admitted 8/21 with lactic acidosis due to cardiogenic shock. Started on 0.25 of milrinone and NE. Ramp echo on 8/13 with severe RV dysfunction. Speed increased 5600-> 6300. Tolerated ok. Repeat ramp echo on 8/20 speed increased to 6500. Diuresed over 40 pounds.   Admitted 10/21 with  ETOH pancreatitis. Gallbladder ok. Resolved with medical management   Here for routine f/u. Says he has been feeling great. 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 Denies ETOH use. Continues smoking 3-5 cigarettes daily. Denies orthopnea or PND. No fevers, chills or problems with driveline. No bleeding, melena or neuro symptoms. No VAD alarms. Taking all meds as prescribed.   Pt missed scheduled IV fereheme on 09/10/20. Pt would like to have appt rescheduled. Appt scheduled for 12/03/20 @ 12 p. Left voicemail message for pt with appt information.      VAD Indication: Destination Therapy   VAD interrogation & Equipment Management: Speed: 6500 Flow: 5.4 Power: 5.8 w    PI: 4.0 Hct: 43   Alarms: few low voltage. 7/11- LOW VOLTAGE & NO EXTERNAL POWERS- son and his friends were running through the house and kept accidentally unplugging MPU Events: 10-30 daily   Primary Controller:  Replace back up battery in 29 months Back up controller: Replace back up battery in 29 months   Annual Equipment Maintenance on UBC/PM was performed 09/03/20  Annual Equipment Maintenance on UBC/PM was performed 09/03/20   Past Medical History:  Diagnosis Date   Asthma    CHF (congestive heart failure) (HCC)    a. 09/2016: EF 20-25% with cath showing normal cors   GERD (gastroesophageal reflux disease)    History of hiatal hernia    LVAD (left ventricular assist device) present (HCC)    OSA on CPAP 09/06/2018   Severe OSA with AHI 68/hr on CPAP at 12cm H2O   Past Surgical History:  Procedure Laterality Date   APPLICATION OF A-CELL OF CHEST/ABDOMEN N/A 08/24/2019   Procedure: Application Of A-Cell Of Chest/Abdomen;  Surgeon: Kerin Perna, MD;  Location: [facility] OR;  Service: Thoracic;  Laterality: N/A;   APPLICATION OF A-CELL OF CHEST/ABDOMEN N/A 09/05/2019   Procedure: Application Of A-Cell Of Chest/Abdomen;  Surgeon: Peggye Form, DO;  Location: [facility] OR;  Service: Plastics;  Laterality: N/A;   APPLICATION OF A-CELL OF CHEST/ABDOMEN  09/03/2019   Procedure: Application Of A-Cell Of Chest/Abdomen;  Surgeon: Kerin Perna, MD;   Location: [facility] OR;  Service: Thoracic;;   APPLICATION OF WOUND VAC N/A 08/22/2019   Procedure: Debridement, Irrigation and Packing of Abdominal Incision.;  Surgeon: Kerin Perna, MD;  Location: [facility] OR;  Service: Thoracic;  Laterality: N/A;   APPLICATION OF WOUND VAC N/A 08/24/2019   Procedure: Irrigation and Debridement with WOUND VAC APPLICATION;  Surgeon: Kerin Perna, MD;  Location: [facility] OR;  Service: Thoracic;  Laterality: N/A;   APPLICATION OF WOUND VAC N/A 08/30/2019   Procedure: APPLICATION OF WOUND VAC;  Surgeon: Kerin Perna, MD;  Location: [facility] OR;  Service: Thoracic;  Laterality: N/A;   APPLICATION OF WOUND VAC N/A 09/03/2019   Procedure: WOUND VAC CHANGE;  Surgeon: Kerin Perna, MD;  Location: [facility] OR;  Service: Thoracic;  Laterality: N/A;   BIOPSY  08/23/2018   Procedure: BIOPSY;  Surgeon: Lemar Lofty., MD;  Location: [facility] ENDOSCOPY;  Service: Gastroenterology;;   COLONOSCOPY N/A 08/23/2018   Procedure: COLONOSCOPY;  Surgeon: Lemar Lofty., MD;  Location: [facility] ENDOSCOPY;  Service: Gastroenterology;  Laterality: N/A;   ENTEROSCOPY N/A 08/23/2018   Procedure: ENTEROSCOPY;  Surgeon: Meridee Score Netty Starring., MD;  Location: [facility] ENDOSCOPY;  Service: Gastroenterology;  Laterality: N/A;   HEMOSTASIS CLIP PLACEMENT  08/23/2018   Procedure: HEMOSTASIS CLIP PLACEMENT;  Surgeon: Lemar Lofty., MD;  Location: [facility] ENDOSCOPY;  Service: Gastroenterology;;   IABP INSERTION N/A 09/05/2017   Procedure: IABP INSERTION;  Surgeon: Dolores Patty, MD;  [facility]: [facility] INVASIVE CV LAB;  Service: Cardiovascular;  Laterality: N/A;   INSERTION OF IMPLANTABLE LEFT VENTRICULAR ASSIST DEVICE N/A 09/06/2017   Procedure: INSERTION OF IMPLANTABLE LEFT VENTRICULAR ASSIST DEVICE - HM3;  Surgeon: Kerin Perna, MD;  Location: [facility] OR;  Service: Open Heart Surgery;  Laterality: N/A;  HM3   MUSCLE FLAP CLOSURE N/A 09/05/2019   Procedure: MUSCLE FLAP CLOSURE;  Surgeon: Peggye Form, DO;   Location: [facility] OR;  Service: Plastics;  Laterality: N/A;   NASAL FRACTURE SURGERY  1987   POLYPECTOMY  08/23/2018   Procedure: POLYPECTOMY;  Surgeon: Mansouraty, Netty Starring., MD;  Location: [facility] ENDOSCOPY;  Service: Gastroenterology;;   RIGHT HEART CATH N/A 08/30/2017   Procedure: RIGHT HEART CATH;  Surgeon: Dolores Patty, MD;  Location: [facility] INVASIVE CV LAB;  Service: Cardiovascular;  Laterality: N/A;   RIGHT/LEFT HEART CATH AND CORONARY ANGIOGRAPHY N/A 10/08/2016   Procedure: Right/Left Heart Cath and Coronary Angiography;  Surgeon: Orpah Cobb, MD;  Location: [facility] INVASIVE CV LAB;  Service: Cardiovascular;  Laterality: N/A;   STERNAL WOUND DEBRIDEMENT N/A 08/20/2019   Procedure: DEBRIDEMENT OF LVAD DRIVELINE TUNNEL;  Surgeon: Kerin Perna, MD;  Location: [facility] OR;  Service: Thoracic;  Laterality: N/A;   STERNAL WOUND DEBRIDEMENT N/A 09/05/2019   Procedure: DEBRIDEMENT AND CLOSURE OF ABDOMINAL WOUND;  Surgeon: Peggye Form, DO;  Location: [facility] OR;  Service: Plastics;  Laterality: N/A;   SUBMUCOSAL TATTOO INJECTION  08/23/2018   Procedure: SUBMUCOSAL TATTOO INJECTION;  Surgeon: Meridee Score Netty Starring., MD;  Location: [facility] ENDOSCOPY;  Service: Gastroenterology;;   TEE WITHOUT CARDIOVERSION N/A 09/06/2017   Procedure: TRANSESOPHAGEAL ECHOCARDIOGRAM (TEE);  Surgeon: Donata Clay, Theron Arista, MD;  Location: [facility] OR;  Service: Open Heart Surgery;  Laterality: N/A;   WOUND DEBRIDEMENT N/A 08/30/2019   Procedure: Debridement Abdominal Wound;  Surgeon: Kerin Perna, MD;  Location: [facility] OR;  Service: Thoracic;  Laterality: N/A;     Current Outpatient Medications  Medication Sig Dispense Refill   acetaminophen (TYLENOL) 500 MG tablet Take 1,500 mg by mouth 3 (three) times daily as needed for headache (pain).     albuterol (PROAIR HFA) 108 (90 Base) MCG/ACT inhaler Inhale 2 puffs into the lungs every 6 (six) hours as needed for wheezing or shortness of breath. 1 each 6   cephALEXin (KEFLEX) 500 MG capsule Take 1  capsule (500 mg total) by mouth 3 (three) times daily. 90 capsule 11   DULoxetine (CYMBALTA) 60 MG capsule Take 1 capsule (60 mg total) by mouth daily. 30 capsule 6   enoxaparin (LOVENOX) 40 MG/0.4ML injection Inject 0.4 mLs (40 mg total) into the skin daily. 4 mL 0   gabapentin (NEURONTIN) 300 MG capsule TAKE ONE CAPSULE BY MOUTH THREE TIMES DAILY 90 capsule 6   hydrOXYzine (ATARAX/VISTARIL) 25 MG tablet Take 2 tablets (50 mg total) by mouth at bedtime as needed for anxiety. (Patient taking differently: Take 50 mg by mouth at bedtime as needed (sleep).) 60 tablet 11   pantoprazole (PROTONIX) 40 MG tablet Take 1 tablet (40 mg total) by mouth 2 (two) times daily. 30 tablet 6   sacubitril-valsartan (ENTRESTO) 97-103 MG Take 1 tablet by mouth 2 (two) times daily. 60 tablet 6   sildenafil (REVATIO) 20 MG tablet Take 1 tablet (20 mg total) by mouth 3 (three) times daily. 90 tablet 11   docusate sodium (STOOL SOFTENER) 100 MG capsule Take 1 capsule (100 mg total) by mouth 2 (two) times daily. (Patient not taking: No sig reported) 60 capsule 11   nicotine (NICODERM CQ - DOSED IN MG/24 HOURS) 21 mg/24hr patch Place 1 patch (21 mg total) onto the skin daily. (Patient not taking: Reported on 11/26/2020) 28 patch 3   spironolactone (ALDACTONE) 25 MG tablet Take 0.5 tablets (12.5 mg total) by mouth daily. 30 tablet 6   torsemide (DEMADEX) 20 MG tablet Take 1 tablet (20 mg total) by mouth every other day. (Patient not taking: No sig reported) 90 tablet 3   warfarin (COUMADIN) 5 MG tablet Take 1/2 tablet (2.5 mg) every Tuesday and Thursday and 1 tablet (5 mg) all other days OR as directed by HF clinic 180 tablet 6   No current facility-administered medications for this encounter.    Allergies:   Patient has no known allergies.   Social History:  The patient  reports that he has quit smoking. His smoking use included cigarettes. He has a 12.50 pack-year smoking history. He has never used smokeless tobacco. He  reports previous alcohol use. He reports previous drug use. Drug: Marijuana.   Family History:  The patient's family history includes Alcohol abuse in his cousin; Hypertension in his father.   ROS:  Please see the history of present illness.   All other systems are personally reviewed and negative.    Vital Signs: Doppler Pressure: 120 Automatc BP: 129/98 (119) HR: 98 SPO2: UTO % RA   Weight: 215.8 lbs w/ eqt Last wt: 210.4 lbs w/ equip   Exam: General:  NAD.  HEENT: normal  Neck: supple. JVP not elevated.  Carotids 2+ bilat; no bruits. No lymphadenopathy or thryomegaly appreciated. Cor: LVAD hum.  Lungs: Clear. Abdomen:  obese soft, nontender, non-distended. No hepatosplenomegaly. No bruits or masses. Good bowel sounds. Driveline site clean. Anchor in place.  Extremities: no cyanosis, clubbing, rash. Warm no edema  Neuro: alert & oriented x 3. No focal deficits. Moves all 4 without problem   Recent Labs: 03/15/2020: Magnesium 1.8 09/03/2020: ALT 12 11/26/2020: BUN 12; Creatinine, Ser 0.84; Hemoglobin 12.9; Platelets 185; Potassium 3.9; Sodium 137    Wt Readings from Last 3 Encounters:  11/26/20 97.9 kg (215 lb 12.8 oz)  09/03/20 95.4 kg (210 lb 6.4 oz)  06/26/20 96.3 kg (212 lb 3.2 oz)      ASSESSMENT AND PLAN:  1. 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 Treated with milrinone and then weaned off.  Diuresed 40 pounds. VAD speed increased 5600 -> 6300 - Continues to do very well. NYHA I-II - Volume status ok. Not requiring torsemide  2.  ETOH pancreatitis, 11/21 - resolved.  - says he has quit drinking (was stressed about his son) - no change  3. Recurrent DL infection with subxiphoid abscess - s/p debridement and pec flap in 4/21 - remains on keflex. Site looks good today.  - He is not a candidate for pump exchange given severe RV dysfunction and poor  self-care  4. HM3 LVAD 09/06/2017.  - VAD interrogated personally. Parameters stable. - LDH 183 - INR 1.8 goal 2.0-2.5  Discussed dosing with PharmD personally. - Off ASA with PUD.  - hgb 12.9 - DL site ok (as above)  5.  Essential HTN - MAPs are up.   - Increase Entresto to 97/103 bid  6. OSA - sleep study with very severe OSA (AHI 69/hr) - continues to refuse therapy. We have arranged f/u in sleep clinic several times but he has not been compliant - He has lost weight and we have offered re-testing several times but he continues to refuse   8. H/o GI bleed  - 08/2018 EGD showed gastritis and nonbleeding duodenal ulcers, colonoscopy with 5 polyps removed.  - Remains on PPI - No recent GI bleeding - hgb 12.9 - He will stay off aspirin for now.  - Pending feraheme infusion for iron deficiency  8. Tobacco Abuse  - continues to smoke a few cigs/day - again discussed need for cessation   Total time spent 35 minutes. Over half that time spent discussing above.     Signed, Arvilla Meres, MD  11/30/2020 4:42 PM  [facility] [address] [facility] [place] [address] [phone] (office) [phone] (fax)

## 2020-12-03 ENCOUNTER — Inpatient Hospital Stay (HOSPITAL_COMMUNITY): Admission: RE | Admit: 2020-12-03 | Payer: Medicare Other | Source: Ambulatory Visit

## 2020-12-03 ENCOUNTER — Other Ambulatory Visit (HOSPITAL_COMMUNITY): Payer: Medicare Other

## 2020-12-04 ENCOUNTER — Other Ambulatory Visit (HOSPITAL_COMMUNITY): Payer: Medicare Other

## 2020-12-05 ENCOUNTER — Ambulatory Visit (HOSPITAL_COMMUNITY)
Admission: RE | Admit: 2020-12-05 | Discharge: 2020-12-05 | Disposition: A | Discharge status: Home / Self Care | Payer: Medicare Other | Source: Ambulatory Visit | Attending: Internal Medicine | Admitting: Internal Medicine

## 2020-12-05 ENCOUNTER — Other Ambulatory Visit (HOSPITAL_COMMUNITY): Payer: Self-pay | Admitting: Unknown Physician Specialty

## 2020-12-05 ENCOUNTER — Other Ambulatory Visit: Payer: Self-pay

## 2020-12-05 ENCOUNTER — Ambulatory Visit (HOSPITAL_COMMUNITY): Payer: Self-pay

## 2020-12-05 DIAGNOSIS — Z7901 Long term (current) use of anticoagulants: Secondary | ICD-10-CM

## 2020-12-05 DIAGNOSIS — Z95811 Presence of heart assist device: Secondary | ICD-10-CM

## 2020-12-05 DIAGNOSIS — Z5181 Encounter for therapeutic drug level monitoring: Secondary | ICD-10-CM | POA: Diagnosis not present

## 2020-12-05 LAB — BASIC METABOLIC PANEL
Anion gap: 5 (ref 5–15)
BUN: 8 mg/dL (ref 6–20)
CO2: 25 mmol/L (ref 22–32)
Calcium: 8.8 mg/dL — ABNORMAL LOW (ref 8.9–10.3)
Chloride: 107 mmol/L (ref 98–111)
Creatinine, Ser: 0.97 mg/dL (ref 0.61–1.24)
GFR, Estimated: >60 mL/min (ref >60)
Glucose, Bld: 116 mg/dL — ABNORMAL HIGH (ref 70–99)
Potassium: 4 mmol/L (ref 3.5–5.1)
Sodium: 137 mmol/L (ref 135–145)

## 2020-12-05 LAB — PROTIME-INR
INR: 1.5 — ABNORMAL HIGH (ref 0.8–1.2)
Prothrombin Time: 18 seconds — ABNORMAL HIGH (ref 11.4–15.2)

## 2020-12-05 NOTE — Progress Notes (Signed)
LVAD INR 

## 2020-12-05 NOTE — Progress Notes (Signed)
Patient presents for dressing change, BP check and labs in VAD clinic today alone. Denies any issues with his VAD or alarms.   Pt states that he has only been taking 1 entresto 49-51 twice a day since his last visit he states that he is supposed to pick up the 97-103 today. BP remains elevated @ 142/104 (130). Pt informed that he must pick up his meds and start them immediately and take all medications as prescribed. Pt assures me that he will do better. We will need to recheck his BP next week.  Exit Site Care: Drive line is being maintained by VAD coordinators. Existing VAD dressing removed and site care performed using sterile technique. Drive line exit site cleaned with Chlora prep applicators x 2, and rinsed with saline, allowed to dry, and Sorbaview dressing with biopatch reapplied. Anchor reapplied and covered with two large tegaderms.  Pt has a small skin tear that was covered under the tegaderm with a 2 x 2. Drive line exit site incorporated, the velour is fully implanted at exit site. No drainage, erythema, tenderness, or foul odor noted.  Pt denies fever or chills. Continue weekly dressing changes. VAD coordinators to perform weekly dressing changes. Patient has adequate dressing supplies at home.  Plan: Return to clinic next Friday prior to IV iron appt for dressing change and BP check.  Aundra Millet will call you with Coumadin instructions and INR dosing.  Carlton Adam RN VAD Coordinator  Office: [phone]  24/7 Pager: [phone]

## 2020-12-12 ENCOUNTER — Ambulatory Visit (HOSPITAL_COMMUNITY)
Admission: RE | Admit: 2020-12-12 | Discharge: 2020-12-12 | Disposition: A | Discharge status: Home / Self Care | Payer: Medicare Other | Source: Ambulatory Visit | Attending: Cardiology | Admitting: Cardiology

## 2020-12-12 ENCOUNTER — Ambulatory Visit (HOSPITAL_COMMUNITY): Payer: Self-pay

## 2020-12-12 ENCOUNTER — Other Ambulatory Visit: Payer: Self-pay

## 2020-12-12 ENCOUNTER — Ambulatory Visit (HOSPITAL_COMMUNITY)
Admission: RE | Admit: 2020-12-12 | Discharge: 2020-12-12 | Disposition: A | Discharge status: Home / Self Care | Payer: Medicare Other | Source: Ambulatory Visit | Attending: Internal Medicine | Admitting: Internal Medicine

## 2020-12-12 VITALS — BP 98/50

## 2020-12-12 DIAGNOSIS — Z7901 Long term (current) use of anticoagulants: Secondary | ICD-10-CM

## 2020-12-12 DIAGNOSIS — Z95811 Presence of heart assist device: Secondary | ICD-10-CM | POA: Diagnosis present

## 2020-12-12 DIAGNOSIS — D649 Anemia, unspecified: Secondary | ICD-10-CM | POA: Insufficient documentation

## 2020-12-12 LAB — PROTIME-INR
INR: 1.9 — ABNORMAL HIGH (ref 0.8–1.2)
Prothrombin Time: 22.1 seconds — ABNORMAL HIGH (ref 11.4–15.2)

## 2020-12-12 LAB — COMPREHENSIVE METABOLIC PANEL
ALT: 45 U/L — ABNORMAL HIGH (ref 0–44)
AST: 70 U/L — ABNORMAL HIGH (ref 15–41)
Albumin: 3.3 g/dL — ABNORMAL LOW (ref 3.5–5.0)
Alkaline Phosphatase: 151 U/L — ABNORMAL HIGH (ref 38–126)
Anion gap: 9 (ref 5–15)
BUN: 17 mg/dL (ref 6–20)
CO2: 25 mmol/L (ref 22–32)
Calcium: 8.8 mg/dL — ABNORMAL LOW (ref 8.9–10.3)
Chloride: 101 mmol/L (ref 98–111)
Creatinine, Ser: 1.14 mg/dL (ref 0.61–1.24)
GFR, Estimated: >60 mL/min (ref >60)
Glucose, Bld: 131 mg/dL — ABNORMAL HIGH (ref 70–99)
Potassium: 4 mmol/L (ref 3.5–5.1)
Sodium: 135 mmol/L (ref 135–145)
Total Bilirubin: 0.8 mg/dL (ref 0.3–1.2)
Total Protein: 7.9 g/dL (ref 6.5–8.1)

## 2020-12-12 LAB — LIPASE, BLOOD: Lipase: 35 U/L (ref 11–51)

## 2020-12-12 MED ORDER — SODIUM CHLORIDE 0.9 % IV SOLN
510.0000 mg | Freq: Once | INTRAVENOUS | Status: AC
  Administered 2020-12-12: 510 mg via INTRAVENOUS
  Filled 2020-12-12: qty 17

## 2020-12-12 MED ORDER — TRAMADOL HCL 100 MG PO TABS: 100.0000 mg | ORAL_TABLET | Freq: Three times a day (TID) | ORAL | 0 refills | Status: DC | PRN

## 2020-12-12 MED ORDER — TRAMADOL HCL ER 100 MG PO TB24: 100.0000 mg | ORAL_TABLET | ORAL | 0 refills | Status: DC | PRN

## 2020-12-12 MED ORDER — WARFARIN SODIUM 5 MG PO TABS: ORAL_TABLET | ORAL | 6 refills | Status: DC

## 2020-12-12 NOTE — Progress Notes (Signed)
Patient presents for dressing change, BP check and labs in VAD clinic today alone. Denies any issues with his VAD or alarms.   Pt states that he started his Entresto 97-103 last week as instructed in clinic. Pts BP in clinic today is much better at 98/50 (63) - pt denies any dizziness or lightheadness. Pt states that he is going to quit smoking and is "really going to get his life together" so that he can get a transplant. Pt would like to have a nicotine screen in clinic in 1 month.  Pt admits to drinking ETOH in the last couple days and is experiencing abdominal pain. Pt states that he has been taking Tylenol for this pain. I have added a CMET and Lipase to his labs today. CMET resulted with increased liver enzymes but his lipase was normal. D/w Dr. Shirlee Latch pt was instructed to hydrate and given a script for Tramadol #10. Pt was instructed not to take anymore tylenol right now due to slightly elevated liver enzymes.  Exit Site Care: Drive line is being maintained by VAD coordinators. Existing VAD dressing removed and site care performed using sterile technique. Drive line exit site cleaned with Chlora prep applicators x 2, and rinsed with saline, allowed to dry, and Sorbaview dressing with biopatch reapplied. Anchor reapplied and covered with two large tegaderms.  Pt has a small skin tear that was covered under the tegaderm with a 2 x 2. Drive line exit site incorporated, the velour is fully implanted at exit site. No drainage, erythema, tenderness, or foul odor noted.  Pt denies fever or chills. Continue weekly dressing changes. VAD coordinators to perform weekly dressing changes. Patient has adequate dressing supplies at home.  Plan: Return to clinic next Friday for dressing change and INR Aundra Millet will call you with Coumadin instructions and INR dosing.  Carlton Adam RN VAD Coordinator  Office: [phone]  24/7 Pager: [phone]

## 2020-12-12 NOTE — Progress Notes (Signed)
LVAD INR 

## 2020-12-19 ENCOUNTER — Ambulatory Visit (HOSPITAL_COMMUNITY): Payer: Self-pay | Admitting: Pharmacist

## 2020-12-19 ENCOUNTER — Ambulatory Visit (HOSPITAL_COMMUNITY)
Admission: RE | Admit: 2020-12-19 | Discharge: 2020-12-19 | Disposition: A | Discharge status: Home / Self Care | Payer: Medicare Other | Source: Ambulatory Visit | Attending: Internal Medicine | Admitting: Internal Medicine

## 2020-12-19 ENCOUNTER — Other Ambulatory Visit: Payer: Self-pay

## 2020-12-19 ENCOUNTER — Other Ambulatory Visit (HOSPITAL_COMMUNITY): Payer: Self-pay | Admitting: *Deleted

## 2020-12-19 DIAGNOSIS — Z95811 Presence of heart assist device: Secondary | ICD-10-CM | POA: Insufficient documentation

## 2020-12-19 DIAGNOSIS — G4733 Obstructive sleep apnea (adult) (pediatric): Secondary | ICD-10-CM | POA: Diagnosis not present

## 2020-12-19 DIAGNOSIS — Z87891 Personal history of nicotine dependence: Secondary | ICD-10-CM | POA: Insufficient documentation

## 2020-12-19 DIAGNOSIS — Z4801 Encounter for change or removal of surgical wound dressing: Secondary | ICD-10-CM | POA: Insufficient documentation

## 2020-12-19 DIAGNOSIS — I110 Hypertensive heart disease with heart failure: Secondary | ICD-10-CM | POA: Diagnosis not present

## 2020-12-19 DIAGNOSIS — K859 Acute pancreatitis without necrosis or infection, unspecified: Secondary | ICD-10-CM | POA: Diagnosis not present

## 2020-12-19 DIAGNOSIS — Z79899 Other long term (current) drug therapy: Secondary | ICD-10-CM | POA: Insufficient documentation

## 2020-12-19 DIAGNOSIS — Z8249 Family history of ischemic heart disease and other diseases of the circulatory system: Secondary | ICD-10-CM | POA: Insufficient documentation

## 2020-12-19 DIAGNOSIS — Z7901 Long term (current) use of anticoagulants: Secondary | ICD-10-CM

## 2020-12-19 DIAGNOSIS — Z720 Tobacco use: Secondary | ICD-10-CM

## 2020-12-19 DIAGNOSIS — F1290 Cannabis use, unspecified, uncomplicated: Secondary | ICD-10-CM | POA: Insufficient documentation

## 2020-12-19 DIAGNOSIS — Z8711 Personal history of peptic ulcer disease: Secondary | ICD-10-CM | POA: Diagnosis not present

## 2020-12-19 DIAGNOSIS — I10 Essential (primary) hypertension: Secondary | ICD-10-CM

## 2020-12-19 DIAGNOSIS — K7689 Other specified diseases of liver: Secondary | ICD-10-CM

## 2020-12-19 DIAGNOSIS — R1011 Right upper quadrant pain: Secondary | ICD-10-CM | POA: Diagnosis not present

## 2020-12-19 DIAGNOSIS — R7989 Other specified abnormal findings of blood chemistry: Secondary | ICD-10-CM

## 2020-12-19 DIAGNOSIS — I5022 Chronic systolic (congestive) heart failure: Secondary | ICD-10-CM | POA: Insufficient documentation

## 2020-12-19 DIAGNOSIS — D72829 Elevated white blood cell count, unspecified: Secondary | ICD-10-CM

## 2020-12-19 LAB — COMPREHENSIVE METABOLIC PANEL
ALT: 18 U/L (ref 0–44)
AST: 16 U/L (ref 15–41)
Albumin: 3.3 g/dL — ABNORMAL LOW (ref 3.5–5.0)
Alkaline Phosphatase: 122 U/L (ref 38–126)
Anion gap: 9 (ref 5–15)
BUN: 10 mg/dL (ref 6–20)
CO2: 23 mmol/L (ref 22–32)
Calcium: 8.9 mg/dL (ref 8.9–10.3)
Chloride: 100 mmol/L (ref 98–111)
Creatinine, Ser: 1.03 mg/dL (ref 0.61–1.24)
GFR, Estimated: >60 mL/min (ref >60)
Glucose, Bld: 105 mg/dL — ABNORMAL HIGH (ref 70–99)
Potassium: 3.9 mmol/L (ref 3.5–5.1)
Sodium: 132 mmol/L — ABNORMAL LOW (ref 135–145)
Total Bilirubin: 0.8 mg/dL (ref 0.3–1.2)
Total Protein: 7.8 g/dL (ref 6.5–8.1)

## 2020-12-19 LAB — CBC
HCT: 38.8 % — ABNORMAL LOW (ref 39.0–52.0)
Hemoglobin: 12.5 g/dL — ABNORMAL LOW (ref 13.0–17.0)
MCH: 28.5 pg (ref 26.0–34.0)
MCHC: 32.2 g/dL (ref 30.0–36.0)
MCV: 88.6 fL (ref 80.0–100.0)
Platelets: 292 10*3/uL (ref 150–400)
RBC: 4.38 MIL/uL (ref 4.22–5.81)
RDW: 13.1 % (ref 11.5–15.5)
WBC: 18.6 10*3/uL — ABNORMAL HIGH (ref 4.0–10.5)
nRBC: 0 % (ref 0.0–0.2)

## 2020-12-19 LAB — LIPID PANEL
Cholesterol: 168 mg/dL (ref 0–200)
HDL: 48 mg/dL (ref >40)
LDL Cholesterol: 106 mg/dL — ABNORMAL HIGH (ref 0–99)
Total CHOL/HDL Ratio: 3.5 RATIO
Triglycerides: 69 mg/dL (ref <150)
VLDL: 14 mg/dL (ref 0–40)

## 2020-12-19 LAB — LIPASE, BLOOD: Lipase: 61 U/L — ABNORMAL HIGH (ref 11–51)

## 2020-12-19 LAB — PROTIME-INR
INR: 1.6 — ABNORMAL HIGH (ref 0.8–1.2)
Prothrombin Time: 18.6 seconds — ABNORMAL HIGH (ref 11.4–15.2)

## 2020-12-19 NOTE — Progress Notes (Addendum)
Patient presents for dressing change and labs in VAD clinic today with his friend Thelma Barge. Denies any issues with his VAD or alarms.   Pt states that he is going to quit smoking and is "really going to get his life together" so that he can get a transplant. Pt would like to have a nicotine screen in clinic in 1 month.  Pt complaining of intermittent RUQ/RLQ "sharp" pain. This mostly occurs at night after he has eaten. Denies ETOH use. He has been taking Tramadol that he was prescribed last week, but does not notice much difference in his pain. He states sitting up when pain occurs helps. Per Dr [name] will complete labs and schedule patient for RUQ ultrasound at [facility].   CMET/Lipase/Triglycerides/CBC/INR drawn today. WBC 18.6. Lipase 65 (up from 35 last week) LFTs improved this week. Lab results reviewed with Dr [name].   RUQ ultrasound scheduled 12/23/20 at 12:30 at [facility]. Called and spoke with patient about appt. Made aware that he needs to be NPO past midnight Monday night. Instructed to page VAD coordinator if pain worsens over the weekend. He verbalized understanding to all instructions.   Exit Site Care: Drive line is being maintained by VAD coordinators. Existing VAD dressing removed and site care performed using sterile technique. Drive line exit site cleaned with Chlora prep applicators x 2, and rinsed with saline, allowed to dry, and Sorbaview dressing with biopatch reapplied. Anchor reapplied and covered with two large tegaderms.  Drive line exit site incorporated, the velour is fully implanted at exit site. No drainage, erythema, tenderness, or foul odor noted.  Pt denies fever or chills. Continue weekly dressing changes. VAD coordinators to perform weekly dressing changes. Patient has adequate dressing supplies at home.  Plan: Return to clinic next Friday for dressing change and INR Coumadin dosing per Lauren PharmD RUQ ultrasound scheduled 12/23/20 at 12:30 at [facility] RN VAD Coordinator  Office: [phone]  24/7 Pager: [phone]

## 2020-12-19 NOTE — Addendum Note (Signed)
Encounter addended by: Bernita Raisin, RN on: 12/19/2020 2:47 PM  Actions taken: Clinical Note Signed

## 2020-12-19 NOTE — Progress Notes (Signed)
LVAD INR 

## 2020-12-19 NOTE — Progress Notes (Signed)
Img

## 2020-12-22 NOTE — Progress Notes (Signed)
VAD Clinic Note   Date:  12/22/2020   ID:  Jonathan Wood, DOB [birth year], MRN [id number]  Location: Home  Provider location: [place] [facility] Type of Visit: Established patient  PCP: [name]  Cardiologist:  Arvilla Meres, MD Primary HF: Dr [name]   Chief Complaint: Heart Failure/LVAD  History of Present Illness:  Jonathan Wood is a 50 y.o. 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  Admitted 4/20 with volume overload and anemia. Diuresed with IV lasix and transitioned to torsemide 20 mg daily. GI consulted EGD with gastritis and nonbleeding duodenal ulcers, colonoscopy with 5 polyps removed.  Admitted 12/20 for DL infection with overlying cellulitis and volume overload.   Admitted 4/21 for DL infection with large subxiphoid abscess. Underwent several debridements and eventually a pec muscle flap. Wound cx + MSSA. Initially on ancef and rifabutin but expanded to cefipime then changed to IV ancef and po rifabutin. Now on oral keflex.  Readmitted 6/21 with superficial abscess and tachypnea. Superficial abscess I&D'd and packed. CT scan done and showed possible deeper fluid collection. Reviewed with TCTS who felt it was muscle flap. Sent to IR for possible drainage but u/s confirmed it was the muscle flap and no drainable fluid collection. Wound cx negative. ID saw and recommended continuing Keflex.   Admitted 8/21 with lactic acidosis due to cardiogenic shock. Started on 0.25 of milrinone and NE. Ramp echo on 8/13 with severe RV dysfunction. Speed increased 5600-> 6300. Tolerated ok. Repeat ramp echo on 8/20 speed increased to 6500. Diuresed over 40 pounds.   Admitted 10/21 with  ETOH pancreatitis. Gallbladder ok. Resolved with medical management   Here for routine dressing change but complaining of RUQ pain so I was asked to see. Says pain comes and  goes. Feels crampy. No relation to food. No epigastric component. No fevers or chills. Denies recent ETOH.      VAD Indication: Destination Therapy   VAD interrogation & Equipment Management: Speed: 6500 Flow: 5.2 Power: 5.6 w    PI: 3.8 Hct: 43   Alarms: few low voltage advisories   Primary Controller:  Replace back up battery in 29 months Back up controller: Replace back up battery in 29 months   Annual Equipment Maintenance on UBC/PM was performed 09/03/20  Annual Equipment Maintenance on UBC/PM was performed 09/03/20   Past Medical History:  Diagnosis Date   Asthma    CHF (congestive heart failure) (HCC)    a. 09/2016: EF 20-25% with cath showing normal cors   GERD (gastroesophageal reflux disease)    History of hiatal hernia    LVAD (left ventricular assist device) present (HCC)    OSA on CPAP 09/06/2018   Severe OSA with AHI 68/hr on CPAP at 12cm H2O   Past Surgical History:  Procedure Laterality Date   APPLICATION OF A-CELL OF CHEST/ABDOMEN N/A 08/24/2019   Procedure: Application Of A-Cell Of Chest/Abdomen;  Surgeon: Kerin Perna, MD;  Location: [facility] OR;  Service: Thoracic;  Laterality: N/A;   APPLICATION OF A-CELL OF CHEST/ABDOMEN N/A 09/05/2019   Procedure: Application Of A-Cell Of Chest/Abdomen;  Surgeon: Peggye Form, DO;  Location: [facility] OR;  Service: Plastics;  Laterality: N/A;   APPLICATION OF A-CELL OF CHEST/ABDOMEN  09/03/2019   Procedure: Application Of A-Cell Of Chest/Abdomen;  Surgeon: Kerin Perna, MD;  Location: [facility] OR;  Service: Thoracic;;  APPLICATION OF WOUND VAC N/A 08/22/2019   Procedure: Debridement, Irrigation and Packing of Abdominal Incision.;  Surgeon: Kerin Perna, MD;  Location: [facility] OR;  Service: Thoracic;  Laterality: N/A;   APPLICATION OF WOUND VAC N/A 08/24/2019   Procedure: Irrigation and Debridement with WOUND VAC APPLICATION;  Surgeon: Kerin Perna, MD;  Location: [facility] OR;  Service: Thoracic;  Laterality: N/A;   APPLICATION OF  WOUND VAC N/A 08/30/2019   Procedure: APPLICATION OF WOUND VAC;  Surgeon: Kerin Perna, MD;  Location: [facility] OR;  Service: Thoracic;  Laterality: N/A;   APPLICATION OF WOUND VAC N/A 09/03/2019   Procedure: WOUND VAC CHANGE;  Surgeon: Kerin Perna, MD;  Location: [facility] OR;  Service: Thoracic;  Laterality: N/A;   BIOPSY  08/23/2018   Procedure: BIOPSY;  Surgeon: Lemar Lofty., MD;  Location: [facility] ENDOSCOPY;  Service: Gastroenterology;;   COLONOSCOPY N/A 08/23/2018   Procedure: COLONOSCOPY;  Surgeon: Lemar Lofty., MD;  Location: [facility] ENDOSCOPY;  Service: Gastroenterology;  Laterality: N/A;   ENTEROSCOPY N/A 08/23/2018   Procedure: ENTEROSCOPY;  Surgeon: Meridee Score Netty Starring., MD;  Location: [facility] ENDOSCOPY;  Service: Gastroenterology;  Laterality: N/A;   HEMOSTASIS CLIP PLACEMENT  08/23/2018   Procedure: HEMOSTASIS CLIP PLACEMENT;  Surgeon: Lemar Lofty., MD;  Location: [facility] ENDOSCOPY;  Service: Gastroenterology;;   IABP INSERTION N/A 09/05/2017   Procedure: IABP INSERTION;  Surgeon: Dolores Patty, MD;  Location: [facility] INVASIVE CV LAB;  Service: Cardiovascular;  Laterality: N/A;   INSERTION OF IMPLANTABLE LEFT VENTRICULAR ASSIST DEVICE N/A 09/06/2017   Procedure: INSERTION OF IMPLANTABLE LEFT VENTRICULAR ASSIST DEVICE - HM3;  Surgeon: Kerin Perna, MD;  Location: [facility] OR;  Service: Open Heart Surgery;  Laterality: N/A;  HM3   MUSCLE FLAP CLOSURE N/A 09/05/2019   Procedure: MUSCLE FLAP CLOSURE;  Surgeon: Peggye Form, DO;  Location: [facility] OR;  Service: Plastics;  Laterality: N/A;   NASAL FRACTURE SURGERY  1987   POLYPECTOMY  08/23/2018   Procedure: POLYPECTOMY;  Surgeon: Mansouraty, Netty Starring., MD;  Location: [facility] ENDOSCOPY;  Service: Gastroenterology;;   RIGHT HEART CATH N/A 08/30/2017   Procedure: RIGHT HEART CATH;  Surgeon: Dolores Patty, MD;  Location: [facility] INVASIVE CV LAB;  Service: Cardiovascular;  Laterality: N/A;   RIGHT/LEFT HEART CATH AND CORONARY ANGIOGRAPHY N/A  10/08/2016   Procedure: Right/Left Heart Cath and Coronary Angiography;  Surgeon: Orpah Cobb, MD;  Location: [facility] INVASIVE CV LAB;  Service: Cardiovascular;  Laterality: N/A;   STERNAL WOUND DEBRIDEMENT N/A 08/20/2019   Procedure: DEBRIDEMENT OF LVAD DRIVELINE TUNNEL;  Surgeon: Kerin Perna, MD;  Location: [facility] OR;  Service: Thoracic;  Laterality: N/A;   STERNAL WOUND DEBRIDEMENT N/A 09/05/2019   Procedure: DEBRIDEMENT AND CLOSURE OF ABDOMINAL WOUND;  Surgeon: Peggye Form, DO;  Location: [facility] OR;  Service: Plastics;  Laterality: N/A;   SUBMUCOSAL TATTOO INJECTION  08/23/2018   Procedure: SUBMUCOSAL TATTOO INJECTION;  Surgeon: Meridee Score Netty Starring., MD;  Location: [facility] ENDOSCOPY;  Service: Gastroenterology;;   TEE WITHOUT CARDIOVERSION N/A 09/06/2017   Procedure: TRANSESOPHAGEAL ECHOCARDIOGRAM (TEE);  Surgeon: Donata Clay, Theron Arista, MD;  Location: [facility] OR;  Service: Open Heart Surgery;  Laterality: N/A;   WOUND DEBRIDEMENT N/A 08/30/2019   Procedure: Debridement Abdominal Wound;  Surgeon: Kerin Perna, MD;  Location: [facility] OR;  Service: Thoracic;  Laterality: N/A;     Current Outpatient Medications  Medication Sig Dispense Refill   acetaminophen (TYLENOL) 500 MG tablet Take 1,500 mg by mouth 3 (three) times daily  as needed for headache (pain).     albuterol (PROAIR HFA) 108 (90 Base) MCG/ACT inhaler Inhale 2 puffs into the lungs every 6 (six) hours as needed for wheezing or shortness of breath. 1 each 6   cephALEXin (KEFLEX) 500 MG capsule Take 1 capsule (500 mg total) by mouth 3 (three) times daily. 90 capsule 11   docusate sodium (STOOL SOFTENER) 100 MG capsule Take 1 capsule (100 mg total) by mouth 2 (two) times daily. (Patient not taking: No sig reported) 60 capsule 11   DULoxetine (CYMBALTA) 60 MG capsule Take 1 capsule (60 mg total) by mouth daily. 30 capsule 6   enoxaparin (LOVENOX) 40 MG/0.4ML injection Inject 0.4 mLs (40 mg total) into the skin daily. 4 mL 0   gabapentin (NEURONTIN) 300 MG  capsule TAKE ONE CAPSULE BY MOUTH THREE TIMES DAILY 90 capsule 6   hydrOXYzine (ATARAX/VISTARIL) 25 MG tablet Take 2 tablets (50 mg total) by mouth at bedtime as needed for anxiety. (Patient taking differently: Take 50 mg by mouth at bedtime as needed (sleep).) 60 tablet 11   nicotine (NICODERM CQ - DOSED IN MG/24 HOURS) 21 mg/24hr patch Place 1 patch (21 mg total) onto the skin daily. (Patient not taking: Reported on 11/26/2020) 28 patch 3   pantoprazole (PROTONIX) 40 MG tablet Take 1 tablet (40 mg total) by mouth 2 (two) times daily. 30 tablet 6   sacubitril-valsartan (ENTRESTO) 97-103 MG Take 1 tablet by mouth 2 (two) times daily. 60 tablet 6   sildenafil (REVATIO) 20 MG tablet Take 1 tablet (20 mg total) by mouth 3 (three) times daily. 90 tablet 11   spironolactone (ALDACTONE) 25 MG tablet Take 0.5 tablets (12.5 mg total) by mouth daily. 30 tablet 6   torsemide (DEMADEX) 20 MG tablet Take 1 tablet (20 mg total) by mouth every other day. (Patient not taking: No sig reported) 90 tablet 3   traMADol HCl 100 MG TABS Take 100 mg by mouth every 8 (eight) hours as needed (for pain). 10 tablet 0   warfarin (COUMADIN) 5 MG tablet Take 1 tablet (5 mg) every day OR as directed by HF clinic 180 tablet 6   No current facility-administered medications for this encounter.    Allergies:   Patient has no known allergies.   Social History:  The patient  reports that he has quit smoking. His smoking use included cigarettes. He has a 12.50 pack-year smoking history. He has never used smokeless tobacco. He reports previous alcohol use. He reports previous drug use. Drug: Marijuana.   Family History:  The patient's family history includes Alcohol abuse in his cousin; Hypertension in his father.   ROS:  Please see the history of present illness.   All other systems are personally reviewed and negative.    Vital Signs: Doppler Pressure: 120 Automatc BP: 129/98 (119) HR: 98 SPO2: UTO % RA   Weight: 215.8 lbs  w/ eqt Last wt: 210.4 lbs w/ equip   Exam: General:  Well appearing. No resp difficulty HEENT: normal Neck: supple. no JVD. Carotids 2+ bilat; no bruits. No lymphadenopathy or thryomegaly appreciated. Cor: PMI nondisplaced. Regular rate & rhythm. No rubs, gallops or murmurs. Lungs: clear Abdomen: soft, nontender, nondistended. No hepatosplenomegaly. No bruits or masses. Good bowel sounds. Extremities: no cyanosis, clubbing, rash, edema Neuro: alert & orientedx3, cranial nerves grossly intact. moves all 4 extremities w/o difficulty. Affect pleasant    Recent Labs: 03/15/2020: Magnesium 1.8 12/19/2020: ALT 18; BUN 10; Creatinine, Ser 1.03; Hemoglobin 12.5;  Platelets 292; Potassium 3.9; Sodium 132    Wt Readings from Last 3 Encounters:  11/26/20 97.9 kg (215 lb 12.8 oz)  09/03/20 95.4 kg (210 lb 6.4 oz)  06/26/20 96.3 kg (212 lb 3.2 oz)      ASSESSMENT AND PLAN:   1. RUQ pain - seems mostly like gas. Labs today not c/w recurrent pancreatitis  - given elevated WBC will refer for RUQ u/s  2. Leukocytosis - no clear source of infection. No fevers or chills. - remains on suppressive abx for previous DL infection - will follow. If still elevated at next visit will need bcx and ucx - await results of RUQ u/s  3. 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 Treated with milrinone and then weaned off.  Diuresed 40 pounds. VAD speed increased 5600 -> 6300 - Stable NYHA I-II. Volume status ok off torsemide  4.  ETOH pancreatitis, 11/21 - resolved.  - says he has quit drinking (was stressed about his son) - see plan as above  4. Recurrent DL infection with subxiphoid abscess - s/p debridement and pec flap in 4/21 - remains on keflex. Site looks good today.  - He is not a candidate for pump exchange given severe RV dysfunction and poor self-care - WBC elevated. See plan as above  5.  HM3 LVAD 09/06/2017.  - VAD interrogated personally. Parameters stable. - LDH not drawn today - INR 1.6 goal 2.0-2.5  Discussed dosing with PharmD personally. - Off ASA with PUD.  - hgb 12.5 (stable) - DL site ok  6.  Essential HTN - MAPs improved on higher dose Entresto  7.  OSA - sleep study with very severe OSA (AHI 69/hr) - continues to refuse therapy. We have arranged f/u in sleep clinic several times but he has not been compliant - He has lost weight and we have offered re-testing several times but he continues to refuse  - no change  8. H/o GI bleed  - 08/2018 EGD showed gastritis and nonbleeding duodenal ulcers, colonoscopy with 5 polyps removed.  - Remains on PPI - No recent GI bleeding - hgb 12.5 MCV 89 (s/p recent feraheme infusion) - He will stay off aspirin for now.   9. Tobacco Abuse  - continues to smoke a few cigs/day - we discussed need for cessation. He is not ready to quit.  Total time spent 40 minutes. Over half that time spent discussing above.    Signed, Arvilla Meres, MD  12/22/2020 3:03 PM  [facility] [address] [facility] [place] [address] [phone] (office) [phone] (fax)

## 2020-12-22 NOTE — Addendum Note (Signed)
Encounter addended by: Dolores Patty, MD on: 12/22/2020 3:05 PM  Actions taken: Pend clinical note

## 2020-12-23 ENCOUNTER — Other Ambulatory Visit: Payer: Self-pay

## 2020-12-23 ENCOUNTER — Ambulatory Visit (HOSPITAL_COMMUNITY)
Admission: RE | Admit: 2020-12-23 | Discharge: 2020-12-23 | Disposition: A | Discharge status: Home / Self Care | Payer: Medicare Other | Source: Ambulatory Visit | Attending: Internal Medicine | Admitting: Internal Medicine

## 2020-12-23 DIAGNOSIS — R7989 Other specified abnormal findings of blood chemistry: Secondary | ICD-10-CM | POA: Insufficient documentation

## 2020-12-23 DIAGNOSIS — Z95811 Presence of heart assist device: Secondary | ICD-10-CM | POA: Insufficient documentation

## 2020-12-23 DIAGNOSIS — K7689 Other specified diseases of liver: Secondary | ICD-10-CM | POA: Insufficient documentation

## 2020-12-23 NOTE — Addendum Note (Signed)
Encounter addended by: Dolores Patty, MD on: 12/23/2020 6:14 PM  Actions taken: Clinical Note Signed, Level of Service modified, Visit diagnoses modified, Charge Capture section accepted

## 2020-12-25 ENCOUNTER — Other Ambulatory Visit (HOSPITAL_COMMUNITY): Payer: Self-pay | Admitting: *Deleted

## 2020-12-25 DIAGNOSIS — Z95811 Presence of heart assist device: Secondary | ICD-10-CM

## 2020-12-25 DIAGNOSIS — Z7901 Long term (current) use of anticoagulants: Secondary | ICD-10-CM

## 2020-12-25 DIAGNOSIS — R1011 Right upper quadrant pain: Secondary | ICD-10-CM

## 2020-12-26 ENCOUNTER — Ambulatory Visit (HOSPITAL_COMMUNITY)
Admission: RE | Admit: 2020-12-26 | Discharge: 2020-12-26 | Disposition: A | Discharge status: Home / Self Care | Payer: Medicare Other | Source: Ambulatory Visit | Attending: Internal Medicine | Admitting: Internal Medicine

## 2020-12-26 ENCOUNTER — Ambulatory Visit (HOSPITAL_COMMUNITY): Payer: Self-pay | Admitting: Pharmacist

## 2020-12-26 ENCOUNTER — Other Ambulatory Visit: Payer: Self-pay

## 2020-12-26 DIAGNOSIS — Z7901 Long term (current) use of anticoagulants: Secondary | ICD-10-CM

## 2020-12-26 DIAGNOSIS — R1011 Right upper quadrant pain: Secondary | ICD-10-CM | POA: Diagnosis present

## 2020-12-26 DIAGNOSIS — Z95811 Presence of heart assist device: Secondary | ICD-10-CM | POA: Diagnosis not present

## 2020-12-26 LAB — COMPREHENSIVE METABOLIC PANEL
ALT: 24 U/L (ref 0–44)
AST: 24 U/L (ref 15–41)
Albumin: 3.4 g/dL — ABNORMAL LOW (ref 3.5–5.0)
Alkaline Phosphatase: 125 U/L (ref 38–126)
Anion gap: 7 (ref 5–15)
BUN: 11 mg/dL (ref 6–20)
CO2: 24 mmol/L (ref 22–32)
Calcium: 9.2 mg/dL (ref 8.9–10.3)
Chloride: 108 mmol/L (ref 98–111)
Creatinine, Ser: 1.01 mg/dL (ref 0.61–1.24)
GFR, Estimated: >60 mL/min (ref >60)
Glucose, Bld: 116 mg/dL — ABNORMAL HIGH (ref 70–99)
Potassium: 4.2 mmol/L (ref 3.5–5.1)
Sodium: 139 mmol/L (ref 135–145)
Total Bilirubin: 0.7 mg/dL (ref 0.3–1.2)
Total Protein: 7.9 g/dL (ref 6.5–8.1)

## 2020-12-26 LAB — CBC
HCT: 38.5 % — ABNORMAL LOW (ref 39.0–52.0)
Hemoglobin: 12.1 g/dL — ABNORMAL LOW (ref 13.0–17.0)
MCH: 28.5 pg (ref 26.0–34.0)
MCHC: 31.4 g/dL (ref 30.0–36.0)
MCV: 90.6 fL (ref 80.0–100.0)
Platelets: 299 10*3/uL (ref 150–400)
RBC: 4.25 MIL/uL (ref 4.22–5.81)
RDW: 13.3 % (ref 11.5–15.5)
WBC: 7 10*3/uL (ref 4.0–10.5)
nRBC: 0 % (ref 0.0–0.2)

## 2020-12-26 LAB — PROTIME-INR
INR: 1.3 — ABNORMAL HIGH (ref 0.8–1.2)
Prothrombin Time: 15.8 seconds — ABNORMAL HIGH (ref 11.4–15.2)

## 2020-12-26 LAB — LIPASE, BLOOD: Lipase: 30 U/L (ref 11–51)

## 2020-12-26 NOTE — Progress Notes (Signed)
LVAD INR 

## 2020-12-26 NOTE — Progress Notes (Signed)
Patient presents for dressing change, BP check and labs in VAD clinic today alone. Denies any issues with his VAD or alarms.   Pt reports RUQ/RLQ abdominal pain is much better since he stopped drinking any ETOH. He will call us if any recurrence of symptoms.   Pt c/o itching after each dressing change, see below.    Exit Site Care: Drive line is being maintained by VAD coordinators. Existing VAD dressing removed and site care performed using sterile technique. Drive line exit site cleaned with alcohol prep x 2 and rinsed with saline, allowed to dry, and Sorbaview dressing with gauze dressing reapplied. Anchor reapplied and covered with two large tegaderms.  Drive line exit site incorporated, the velour is fully implanted at exit site. No drainage, erythema, tenderness, or foul odor noted.  Pt denies fever or chills. Continue weekly dressing changes. VAD coordinators to perform weekly dressing changes. Patient has adequate dressing supplies at home. Provided patient with 4 weekly dressing kits for home use. Asked him to bring one to each clinic visit.   Plan: Return to clinic next Friday for dressing change and INR PhamrD will call you with Coumadin instructions and INR dosing.  Hessie Diener RN VAD Coordinator  Office: [phone]  24/7 Pager: [phone]

## 2020-12-30 ENCOUNTER — Other Ambulatory Visit (HOSPITAL_COMMUNITY): Payer: Self-pay | Admitting: Unknown Physician Specialty

## 2020-12-30 DIAGNOSIS — Z7901 Long term (current) use of anticoagulants: Secondary | ICD-10-CM

## 2020-12-30 DIAGNOSIS — Z95811 Presence of heart assist device: Secondary | ICD-10-CM

## 2020-12-31 LAB — CULTURE, BLOOD (SINGLE)
Culture: NO GROWTH
Culture: NO GROWTH

## 2021-01-02 ENCOUNTER — Ambulatory Visit (HOSPITAL_COMMUNITY)
Admission: RE | Admit: 2021-01-02 | Discharge: 2021-01-02 | Disposition: A | Discharge status: Home / Self Care | Payer: Medicare Other | Source: Ambulatory Visit | Attending: Internal Medicine | Admitting: Internal Medicine

## 2021-01-02 ENCOUNTER — Other Ambulatory Visit: Payer: Self-pay

## 2021-01-02 ENCOUNTER — Ambulatory Visit (HOSPITAL_COMMUNITY): Payer: Self-pay | Admitting: Pharmacist

## 2021-01-02 DIAGNOSIS — Z95811 Presence of heart assist device: Secondary | ICD-10-CM | POA: Insufficient documentation

## 2021-01-02 DIAGNOSIS — Z4801 Encounter for change or removal of surgical wound dressing: Secondary | ICD-10-CM | POA: Diagnosis present

## 2021-01-02 DIAGNOSIS — Z7901 Long term (current) use of anticoagulants: Secondary | ICD-10-CM | POA: Insufficient documentation

## 2021-01-02 LAB — PROTIME-INR
INR: 1.3 — ABNORMAL HIGH (ref 0.8–1.2)
Prothrombin Time: 16.5 seconds — ABNORMAL HIGH (ref 11.4–15.2)

## 2021-01-02 MED ORDER — ENOXAPARIN SODIUM 40 MG/0.4ML IJ SOSY: 40.0000 mg | PREFILLED_SYRINGE | INTRAMUSCULAR | 0 refills | Status: DC

## 2021-01-02 NOTE — Progress Notes (Signed)
Patient presents for dressing change, BP check and labs in VAD clinic today alone. Denies any issues with his VAD or alarms.    Reports itching under dressing has improved since leaving Tegaderm off, but wants Tegaderm back over dressing.    Exit Site Care: Drive line is being maintained by VAD coordinators. Existing VAD dressing removed and site care performed using sterile technique. Drive line exit site cleaned with CHG swabs x 2 and rinsed with saline, allowed to dry, and Sorbaview dressing with bio patch applied. Anchor reapplied and covered with two large tegaderms.  Drive line exit site incorporated, the velour is fully implanted at exit site. No drainage, erythema, tenderness, or foul odor noted.  Pt denies fever or chills. Continue weekly dressing changes. VAD coordinators to perform weekly dressing changes. Provided patient with large tegaderms for home use.   Plan: Return to clinic next Friday for dressing change and INR PhamrD will call you with Coumadin instructions and INR dosing.  Hessie Diener RN VAD Coordinator  Office: [phone]  24/7 Pager: [phone]

## 2021-01-02 NOTE — Progress Notes (Signed)
LVAD INR 

## 2021-01-08 ENCOUNTER — Other Ambulatory Visit (HOSPITAL_COMMUNITY): Payer: Self-pay | Admitting: Unknown Physician Specialty

## 2021-01-08 DIAGNOSIS — Z7901 Long term (current) use of anticoagulants: Secondary | ICD-10-CM

## 2021-01-08 DIAGNOSIS — Z95811 Presence of heart assist device: Secondary | ICD-10-CM

## 2021-01-09 ENCOUNTER — Ambulatory Visit (HOSPITAL_COMMUNITY): Payer: Self-pay | Admitting: Pharmacist

## 2021-01-09 ENCOUNTER — Other Ambulatory Visit (HOSPITAL_COMMUNITY): Payer: Self-pay | Admitting: *Deleted

## 2021-01-09 ENCOUNTER — Other Ambulatory Visit: Payer: Self-pay

## 2021-01-09 ENCOUNTER — Ambulatory Visit (HOSPITAL_COMMUNITY)
Admission: RE | Admit: 2021-01-09 | Discharge: 2021-01-09 | Disposition: A | Discharge status: Home / Self Care | Payer: Medicare Other | Source: Ambulatory Visit | Attending: Cardiology | Admitting: Cardiology

## 2021-01-09 DIAGNOSIS — Z95811 Presence of heart assist device: Secondary | ICD-10-CM | POA: Insufficient documentation

## 2021-01-09 DIAGNOSIS — Z4801 Encounter for change or removal of surgical wound dressing: Secondary | ICD-10-CM | POA: Insufficient documentation

## 2021-01-09 DIAGNOSIS — Z7901 Long term (current) use of anticoagulants: Secondary | ICD-10-CM

## 2021-01-09 LAB — PROTIME-INR
INR: 1.6 — ABNORMAL HIGH (ref 0.8–1.2)
Prothrombin Time: 18.9 seconds — ABNORMAL HIGH (ref 11.4–15.2)

## 2021-01-09 NOTE — Addendum Note (Signed)
Encounter addended by: Bernita Raisin, RN on: 01/09/2021 11:58 AM  Actions taken: Clinical Note Signed

## 2021-01-09 NOTE — Progress Notes (Signed)
LVAD INR 

## 2021-01-09 NOTE — Progress Notes (Signed)
Patient presents for dressing change and INR in VAD clinic today alone. Denies any issues with his VAD or alarms.    Exit Site Care: Drive line is being maintained by VAD coordinators. Existing VAD dressing removed and site care performed using sterile technique. Drive line exit site cleaned with CHG swabs x 2 and rinsed with saline, allowed to dry, and Sorbaview dressing with bio patch applied. Anchor reapplied and covered with two large tegaderms.  Drive line exit site incorporated, the velour is fully implanted at exit site. No drainage, erythema, tenderness, or foul odor noted.  Pt denies fever or chills. Continue weekly dressing changes. VAD coordinators to perform weekly dressing changes. Provided patient with large tegaderms for home use.   Plan: Return to clinic next Friday for dressing change and INR PhamrD will call you with Coumadin instructions and INR dosing.  Alyce Pagan RN VAD Coordinator  Office: [phone]  24/7 Pager: [phone]

## 2021-01-09 NOTE — Addendum Note (Signed)
Addended by: Alyce Pagan B on: 01/09/2021 02:51 PM   Modules accepted: Orders

## 2021-01-15 ENCOUNTER — Other Ambulatory Visit (HOSPITAL_COMMUNITY): Payer: Self-pay | Admitting: *Deleted

## 2021-01-15 ENCOUNTER — Ambulatory Visit (HOSPITAL_COMMUNITY)
Admission: RE | Admit: 2021-01-15 | Discharge: 2021-01-15 | Disposition: A | Discharge status: Home / Self Care | Payer: Medicare Other | Source: Ambulatory Visit | Attending: Cardiology | Admitting: Cardiology

## 2021-01-15 ENCOUNTER — Other Ambulatory Visit: Payer: Self-pay

## 2021-01-15 ENCOUNTER — Ambulatory Visit (HOSPITAL_COMMUNITY): Payer: Self-pay | Admitting: Pharmacist

## 2021-01-15 DIAGNOSIS — Z7901 Long term (current) use of anticoagulants: Secondary | ICD-10-CM

## 2021-01-15 DIAGNOSIS — Z95811 Presence of heart assist device: Secondary | ICD-10-CM | POA: Diagnosis not present

## 2021-01-15 DIAGNOSIS — Z4801 Encounter for change or removal of surgical wound dressing: Secondary | ICD-10-CM | POA: Diagnosis not present

## 2021-01-15 LAB — PROTIME-INR
INR: 1.7 — ABNORMAL HIGH (ref 0.8–1.2)
Prothrombin Time: 20.2 seconds — ABNORMAL HIGH (ref 11.4–15.2)

## 2021-01-15 NOTE — Progress Notes (Signed)
LVAD INR 

## 2021-01-15 NOTE — Progress Notes (Signed)
Patient presents for dressing change and INR in VAD clinic today alone. Denies any issues with his VAD or alarms.    Exit Site Care: Drive line is being maintained by VAD coordinators. Existing VAD dressing removed and site care performed using sterile technique. Drive line exit site cleaned with CHG swabs x 2 and rinsed with saline, allowed to dry, and Sorbaview dressing with silverlon patch applied. Anchor reapplied and covered with two large tegaderms.  Drive line exit site incorporated, the velour is fully implanted at exit site. No drainage, erythema, tenderness, or foul odor noted.  Pt denies fever or chills. Continue weekly dressing changes. VAD coordinators to perform weekly dressing changes.   Plan: Return to clinic next in one week for INR and dsg change.  PhamrD will call you with Coumadin instructions and INR dosing.   Hessie Diener RN VAD Coordinator  Office: [phone]  24/7 Pager: [phone]

## 2021-01-16 ENCOUNTER — Other Ambulatory Visit (HOSPITAL_COMMUNITY): Payer: Medicare Other

## 2021-01-20 LAB — NICOTINE/COTININE METABOLITES
Cotinine: 378.1 ng/mL
Nicotine: 16.2 ng/mL

## 2021-01-22 ENCOUNTER — Other Ambulatory Visit (HOSPITAL_COMMUNITY): Payer: Medicare Other

## 2021-01-27 ENCOUNTER — Ambulatory Visit (HOSPITAL_COMMUNITY)
Admission: RE | Admit: 2021-01-27 | Discharge: 2021-01-27 | Disposition: A | Discharge status: Home / Self Care | Payer: Medicare Other | Source: Ambulatory Visit | Attending: Internal Medicine | Admitting: Internal Medicine

## 2021-01-27 ENCOUNTER — Other Ambulatory Visit: Payer: Self-pay

## 2021-01-27 ENCOUNTER — Ambulatory Visit (HOSPITAL_COMMUNITY): Payer: Self-pay | Admitting: Pharmacist

## 2021-01-27 DIAGNOSIS — Z48812 Encounter for surgical aftercare following surgery on the circulatory system: Secondary | ICD-10-CM | POA: Insufficient documentation

## 2021-01-27 DIAGNOSIS — Z95811 Presence of heart assist device: Secondary | ICD-10-CM | POA: Insufficient documentation

## 2021-01-27 DIAGNOSIS — Z7901 Long term (current) use of anticoagulants: Secondary | ICD-10-CM | POA: Insufficient documentation

## 2021-01-27 LAB — PROTIME-INR
INR: 1.8 — ABNORMAL HIGH (ref 0.8–1.2)
Prothrombin Time: 20.8 seconds — ABNORMAL HIGH (ref 11.4–15.2)

## 2021-01-27 MED ORDER — WARFARIN SODIUM 5 MG PO TABS: ORAL_TABLET | ORAL | 6 refills | Status: DC

## 2021-01-27 NOTE — Progress Notes (Signed)
Patient presents for dressing change and INR in VAD clinic today alone. Denies any issues with his VAD or alarms.    Exit Site Care: Drive line is being maintained by VAD coordinators. Existing VAD dressing removed and site care performed using sterile technique. Drive line exit site cleaned with CHG swabs x 2 and rinsed with saline, allowed to dry, and Sorbaview dressing with silverlon patch applied. Anchor reapplied and covered with two large tegaderms.  Drive line exit site incorporated, the velour is fully implanted at exit site. No drainage, erythema, tenderness, or foul odor noted.  Pt denies fever or chills. Continue weekly dressing changes. VAD coordinators to perform weekly dressing changes.   Plan: Return to clinic next in 10 days for INR and dsg change.  PhamrD will call you with Coumadin instructions and INR dosing.  Carlton Adam RN VAD Coordinator  Office: [phone]  24/7 Pager: [phone]

## 2021-01-27 NOTE — Progress Notes (Signed)
LVAD INR 

## 2021-02-02 ENCOUNTER — Telehealth (HOSPITAL_COMMUNITY): Payer: Self-pay | Admitting: Unknown Physician Specialty

## 2021-02-02 NOTE — Telephone Encounter (Signed)
Received page from pt stating that his controller [phone]) is alarming connect power. Pt states that he is on batteries and they are fully charged. Pt was instructed to switch over to his MPU. Pt continue to alarm connect power after switching power source. At this point pt was instructed to come to VAD clinic for controller change out. On arrival pt is alarming connect power. It appears the black lead is not working. There are no visible signs of damage to either lead. The decision was made change pts controller. Pt was refreshed on how to change his controller and pt was able to change his own controller with VAD coordinator supervision. Pt tolerated well and was d/c from clinic with no alarms. VAD coordinator was unable to reciporcate any alarms after having pt walk, bend, sit and stand with new controller. Log files were sent to [employer] for review. Dr. [name] updated. New Controller : [phone]  Existing VAD dressing removed and site care performed using sterile technique. Drive line exit site cleaned with Chlora prep applicators x 2, allowed to dry, and Sorbaview dressing with Silverlon re-applied. Exit site healed and incorporated, the velour is fully implanted at exit site. No redness, tenderness, drainage, foul odor or rash noted. Drive line anchor re-applied. Pt denies fever or chills.    Return in 1 week for another dressing change per standard of care.    Carlton Adam RN, BSN VAD Coordinator 24/7 Pager [phone]

## 2021-02-04 ENCOUNTER — Encounter (HOSPITAL_COMMUNITY): Payer: Medicare Other

## 2021-02-06 ENCOUNTER — Other Ambulatory Visit (HOSPITAL_COMMUNITY): Payer: Self-pay | Admitting: *Deleted

## 2021-02-06 ENCOUNTER — Other Ambulatory Visit (HOSPITAL_COMMUNITY): Payer: Medicare Other

## 2021-02-06 DIAGNOSIS — Z7901 Long term (current) use of anticoagulants: Secondary | ICD-10-CM

## 2021-02-06 DIAGNOSIS — Z95811 Presence of heart assist device: Secondary | ICD-10-CM

## 2021-02-13 ENCOUNTER — Other Ambulatory Visit (HOSPITAL_COMMUNITY): Payer: Medicare Other

## 2021-02-17 ENCOUNTER — Other Ambulatory Visit (HOSPITAL_COMMUNITY): Payer: Self-pay | Admitting: *Deleted

## 2021-02-17 DIAGNOSIS — Z7901 Long term (current) use of anticoagulants: Secondary | ICD-10-CM

## 2021-02-17 DIAGNOSIS — R6889 Other general symptoms and signs: Secondary | ICD-10-CM

## 2021-02-17 DIAGNOSIS — Z95811 Presence of heart assist device: Secondary | ICD-10-CM

## 2021-02-18 ENCOUNTER — Ambulatory Visit (HOSPITAL_COMMUNITY)
Admission: RE | Admit: 2021-02-18 | Discharge: 2021-02-18 | Disposition: A | Discharge status: Home / Self Care | Payer: Medicare Other | Source: Ambulatory Visit | Attending: Cardiology | Admitting: Cardiology

## 2021-02-18 ENCOUNTER — Encounter (HOSPITAL_COMMUNITY): Payer: Self-pay

## 2021-02-18 ENCOUNTER — Other Ambulatory Visit: Payer: Self-pay

## 2021-02-18 ENCOUNTER — Ambulatory Visit (HOSPITAL_COMMUNITY): Payer: Self-pay | Admitting: Pharmacist

## 2021-02-18 VITALS — BP 110/0 | HR 93 | Temp 98.4°F | Ht 74.0 in | Wt 207.0 lb

## 2021-02-18 DIAGNOSIS — Z09 Encounter for follow-up examination after completed treatment for conditions other than malignant neoplasm: Secondary | ICD-10-CM | POA: Insufficient documentation

## 2021-02-18 DIAGNOSIS — K859 Acute pancreatitis without necrosis or infection, unspecified: Secondary | ICD-10-CM | POA: Insufficient documentation

## 2021-02-18 DIAGNOSIS — Z7901 Long term (current) use of anticoagulants: Secondary | ICD-10-CM | POA: Insufficient documentation

## 2021-02-18 DIAGNOSIS — Z95811 Presence of heart assist device: Secondary | ICD-10-CM

## 2021-02-18 DIAGNOSIS — I110 Hypertensive heart disease with heart failure: Secondary | ICD-10-CM | POA: Insufficient documentation

## 2021-02-18 DIAGNOSIS — I5022 Chronic systolic (congestive) heart failure: Secondary | ICD-10-CM | POA: Insufficient documentation

## 2021-02-18 DIAGNOSIS — K219 Gastro-esophageal reflux disease without esophagitis: Secondary | ICD-10-CM | POA: Insufficient documentation

## 2021-02-18 DIAGNOSIS — Z79899 Other long term (current) drug therapy: Secondary | ICD-10-CM | POA: Diagnosis not present

## 2021-02-18 DIAGNOSIS — R6889 Other general symptoms and signs: Secondary | ICD-10-CM | POA: Diagnosis not present

## 2021-02-18 DIAGNOSIS — G4733 Obstructive sleep apnea (adult) (pediatric): Secondary | ICD-10-CM | POA: Insufficient documentation

## 2021-02-18 DIAGNOSIS — Z8711 Personal history of peptic ulcer disease: Secondary | ICD-10-CM | POA: Insufficient documentation

## 2021-02-18 DIAGNOSIS — Z8249 Family history of ischemic heart disease and other diseases of the circulatory system: Secondary | ICD-10-CM | POA: Insufficient documentation

## 2021-02-18 DIAGNOSIS — F17210 Nicotine dependence, cigarettes, uncomplicated: Secondary | ICD-10-CM | POA: Diagnosis not present

## 2021-02-18 DIAGNOSIS — I10 Essential (primary) hypertension: Secondary | ICD-10-CM

## 2021-02-18 DIAGNOSIS — Z720 Tobacco use: Secondary | ICD-10-CM

## 2021-02-18 LAB — CBC
HCT: 44 % (ref 39.0–52.0)
Hemoglobin: 14.3 g/dL (ref 13.0–17.0)
MCH: 29.4 pg (ref 26.0–34.0)
MCHC: 32.5 g/dL (ref 30.0–36.0)
MCV: 90.5 fL (ref 80.0–100.0)
Platelets: 188 10*3/uL (ref 150–400)
RBC: 4.86 MIL/uL (ref 4.22–5.81)
RDW: 14.5 % (ref 11.5–15.5)
WBC: 5.9 10*3/uL (ref 4.0–10.5)
nRBC: 0 % (ref 0.0–0.2)

## 2021-02-18 LAB — FERRITIN: Ferritin: 73 ng/mL (ref 24–336)

## 2021-02-18 LAB — COMPREHENSIVE METABOLIC PANEL
ALT: 17 U/L (ref 0–44)
AST: 23 U/L (ref 15–41)
Albumin: 3.9 g/dL (ref 3.5–5.0)
Alkaline Phosphatase: 85 U/L (ref 38–126)
Anion gap: 8 (ref 5–15)
BUN: 8 mg/dL (ref 6–20)
CO2: 22 mmol/L (ref 22–32)
Calcium: 9.1 mg/dL (ref 8.9–10.3)
Chloride: 105 mmol/L (ref 98–111)
Creatinine, Ser: 0.89 mg/dL (ref 0.61–1.24)
GFR, Estimated: >60 mL/min (ref >60)
Glucose, Bld: 99 mg/dL (ref 70–99)
Potassium: 3.7 mmol/L (ref 3.5–5.1)
Sodium: 135 mmol/L (ref 135–145)
Total Bilirubin: 1 mg/dL (ref 0.3–1.2)
Total Protein: 7.5 g/dL (ref 6.5–8.1)

## 2021-02-18 LAB — VITAMIN B12: Vitamin B-12: 345 pg/mL (ref 180–914)

## 2021-02-18 LAB — IRON AND TIBC
Iron: 95 ug/dL (ref 45–182)
Saturation Ratios: 27 % (ref 17.9–39.5)
TIBC: 349 ug/dL (ref 250–450)
UIBC: 254 ug/dL

## 2021-02-18 LAB — PROTIME-INR
INR: 1.5 — ABNORMAL HIGH (ref 0.8–1.2)
Prothrombin Time: 18.4 seconds — ABNORMAL HIGH (ref 11.4–15.2)

## 2021-02-18 LAB — LACTATE DEHYDROGENASE: LDH: 182 U/L (ref 98–192)

## 2021-02-18 LAB — PREALBUMIN: Prealbumin: 20.2 mg/dL (ref 18–38)

## 2021-02-18 LAB — FOLATE: Folate: 13.7 ng/mL (ref >5.9)

## 2021-02-18 MED ORDER — WARFARIN SODIUM 5 MG PO TABS: ORAL_TABLET | ORAL | 6 refills | Status: DC

## 2021-02-18 NOTE — Progress Notes (Signed)
Patient presents for 2 mo f/u and 3.5 year Intermacs in VAD clinic today alone. Denies any issues with VAD or equipment.   Pt states he has been feeling great.  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 Reports this is how he is losing weight, along with eating healthier. Denies ETOH use. Continues smoking 3-5 cigarettes daily.   Denies lightheadedness, falls, shortness of breath, or signs of bleeding. Reports occasional dizziness with orthostatic changes. Increased # of PI events noted on VAD parameter history. He reports he is drinking "2 - 3 bottles of water daily". Advised him to increase daily fluid intake to 2 liters/day per Dr. [name].   Pt confirms he increased his Entresto dose as instructed last clinic visit.   Pt c/o episodes of acid reflux. Reports he is taking Protonix once daily; instructed to increase to twice daily per Dr. [name]. If reflux does not resolve, may start Dexilant.  Vital Signs: Temp: 98.4 Doppler Pressure: 110 Automatc BP: 110/71 (87) HR: 93 SPO2: UTO   Weight: 207 lbs w/ eqt Last wt: 215.8 lbs w/ equip   VAD Indication: Destination Therapy    VAD interrogation & Equipment Management: Speed: 6500 Flow: 5.6 Power: 5.8w    PI: 2.0 Hct: 38  Alarms: none Events: 70+ PI events  Primary Controller:  Replace back up battery in 33 months Back up controller: did not bring  Annual Equipment Maintenance on UBC/PM was performed 09/03/20   I reviewed the LVAD parameters from today and compared the results to the patient's prior recorded data.  LVAD interrogation was NEGATIVE for significant power changes, NEGATIVE for clinical alarms and STABLE for PI events/speed drops. No programming changes were made and pump is functioning within specified parameters.   Exit Site Care: Drive line is being maintained weekly by VAD coordinators. Existing VAD dressing removed and site care performed using sterile  technique. Drive line exit site cleaned with Chlora prep applicators x 2, RINSED WITH SALINE allowed to dry, and Sorbaview dressing with Silverlon patch reapplied. Anchor reapplied. Covered with 2 large tegaderms. Drive line exit site incorporated, the velour is fully implanted at exit site. No drainage, erythema, tenderness, or foul odor noted.  Pt denies fever or chills. Continue weekly dressing changes. VAD coordinators to perform weekly dressing changes. Provided patient with 3 weekly dressing kits and ask him to bring one to each dressing change visit.   Device: none  BP & Labs:  Doppler 110 - reflecting MAP   Hgb 14.3 - No S/S of bleeding. Specifically denies melena/BRBPR or nosebleeds.   LDH 182 - established baseline of 200-300. Denies tea-colored urine. No power elevations noted on interrogation.  3.5 year Intermacs follow up completed including:  Quality of Life, KCCQ-12, and Neurocognitive trail making.   Pt completed 1500 feet during 6 minute walk.  [facility] Cardiomyopathy Questionnaire  KCCQ-12 09/03/2020 03/12/2020  1 a. Ability to shower/bathe Moderately limited Slightly limited  1 b. Ability to walk 1 block Not at all limited Slightly limited  1 c. Ability to hurry/jog Quite a bit limited Moderately limited  2. Edema feet/ankles/legs Never over the past 2 weeks Never over the past 2 weeks  3. Limited by fatigue 1-2 times a week 1-2 times a week  4. Limited by dyspnea Less than once a week 3+ times a week, not every day  5. Sitting up / on 3+ pillows Never over the past 2 weeks  Never over the past 2 weeks  6. Limited enjoyment of life Moderately limited Slightly limited  7. Rest of life w/ symptoms Mostly satisfied Somewhat satisfied  8 a. Participation in hobbies Slightly limited Moderately limited  8 b. Participation in chores Moderately limited Moderately limited  8 c. Visiting family/friends N/A, did not do for other reasons Moderately limited      Back up  controller: pt did not bring. Asked him to bring next clinic visit for controller check and back up battery charge.    Patient Instructions:  1. Increase your Protonix to twice daily. 2.  Increase your fluid intake to 2 liters per day. 3.  Return in one week for dressing changes and INR if needed.  4.  Lauren, PharmD will call you with INR results and warfarin dosing. 5.  Return to VAD Clinic in 2 months.   Hessie Diener RN VAD Coordinator  Office: [phone]  24/7 Pager: [phone]

## 2021-02-18 NOTE — Patient Instructions (Addendum)
1. Increase your Protonix to twice daily. 2.  Increase your fluid intake to 2 liters per day. 3.  Return in one week for dressing changes and INR if needed.  4.  Lauren, PharmD will call you with INR results and warfarin dosing. 5.  Return to VAD Clinic in 2 months.

## 2021-02-18 NOTE — Progress Notes (Signed)
LVAD INR 

## 2021-02-19 NOTE — Progress Notes (Signed)
VAD Clinic Note   Date:  02/19/2021   ID:  Jonathan Wood, DOB 1970/11/04, MRN [id number]  Location: Home  Provider location: [place] [facility] Type of Visit: Established patient  PCP: [name]  Cardiologist:  Arvilla Meres, Wood Primary HF: Dr [name]   Chief Complaint: Heart Failure/LVAD  History of Present Illness:  Jonathan Wood is a 50 y.o. 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  Admitted 4/20 with volume overload and anemia. Diuresed with IV lasix and transitioned to torsemide 20 mg daily. GI consulted EGD with gastritis and nonbleeding duodenal ulcers, colonoscopy with 5 polyps removed.  Admitted 12/20 for DL infection with overlying cellulitis and volume overload.   Admitted 4/21 for DL infection with large subxiphoid abscess. Underwent several debridements and eventually a pec muscle flap. Wound cx + MSSA. Initially on ancef and rifabutin but expanded to cefipime then changed to IV ancef and po rifabutin. Now on oral keflex.  Readmitted 6/21 with superficial abscess and tachypnea. Superficial abscess I&D'd and packed. CT scan done and showed possible deeper fluid collection. Reviewed with TCTS who felt it was muscle flap. Sent to IR for possible drainage but u/s confirmed it was the muscle flap and no drainable fluid collection. Wound cx negative. ID saw and recommended continuing Keflex.   Admitted 8/21 with lactic acidosis due to cardiogenic shock. Started on 0.25 of milrinone and NE. Ramp echo on 8/13 with severe RV dysfunction. Speed increased 5600-> 6300. Tolerated ok. Repeat ramp echo on 8/20 speed increased to 6500. Diuresed over 40 pounds.   Admitted 10/21 with  ETOH pancreatitis. Gallbladder ok. Resolved with medical management   Here for VAD f/u. Says he feels great. 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 Denies orthopnea or PND. No  edema. Has not needed diuretics. No fevers, chills or problems with driveline. No bleeding, melena or neuro symptoms. Only complaint is GERD. No VAD alarms. Taking all meds as prescribed.     VAD Indication: Destination Therapy    VAD interrogation & Equipment Management: Speed: 6500 Flow: 5.6 Power: 5.8w    PI: 2.0 Hct: 38   Alarms: none Events: 70+ PI events   Primary Controller:  Replace back up battery in 33 months Back up controller: did not bring   Annual Equipment Maintenance on UBC/PM was performed 09/03/20    I reviewed the LVAD parameters from today and compared the results to the patient's prior recorded data.  LVAD interrogation was NEGATIVE for significant power changes, NEGATIVE for clinical alarms and STABLE for PI events/speed drops. No programming changes were made and pump is functioning within specified parameters.     Past Medical History:  Diagnosis Date   Asthma    CHF (congestive heart failure) (HCC)    a. 09/2016: EF 20-25% with cath showing normal cors   GERD (gastroesophageal reflux disease)    History of hiatal hernia    LVAD (left ventricular assist device) present (HCC)    OSA on CPAP 09/06/2018   Severe OSA with AHI 68/hr on CPAP at 12cm H2O   Past Surgical History:  Procedure Laterality Date   APPLICATION OF A-CELL OF CHEST/ABDOMEN N/A 08/24/2019   Procedure: Application Of A-Cell Of Chest/Abdomen;  Surgeon: Jonathan Wood;  Location: [facility] OR;  Service: Thoracic;  Laterality: N/A;   APPLICATION OF A-CELL OF CHEST/ABDOMEN N/A 09/05/2019   Procedure: Application  Of A-Cell Of Chest/Abdomen;  Surgeon: Jonathan Wood;  Location: [facility] OR;  Service: Plastics;  Laterality: N/A;   APPLICATION OF A-CELL OF CHEST/ABDOMEN  09/03/2019   Procedure: Application Of A-Cell Of Chest/Abdomen;  Surgeon: Jonathan Wood;  Location[facility] OR;  Service: Thoracic;;   APPLICATION OF WOUND VAC N/A 08/22/2019   Procedure: Debridement, Irrigation and Packing of  Abdominal Incision.;  Surgeon: Jonathan Wood;  Location: [facility] OR;  Service: Thoracic;  Laterality: N/A;   APPLICATION OF WOUND VAC N/A 08/24/2019   Procedure: Irrigation and Debridement with WOUND VAC APPLICATION;  Surgeon: Jonathan Wood;  Location: [facility] OR;  Service: Thoracic;  Laterality: N/A;   APPLICATION OF WOUND VAC N/A 08/30/2019   Procedure: APPLICATION OF WOUND VAC;  Surgeon: Jonathan Wood;  Location: [facility] OR;  Service: Thoracic;  Laterality: N/A;   APPLICATION OF WOUND VAC N/A 09/03/2019   Procedure: WOUND VAC CHANGE;  Surgeon: Jonathan Wood;  Location: [facility] OR;  Service: Thoracic;  Laterality: N/A;   BIOPSY  08/23/2018   Procedure: BIOPSY;  Surgeon: Jonathan Wood;  Location: [facility] ENDOSCOPY;  Service: Gastroenterology;;   COLONOSCOPY N/A 08/23/2018   Procedure: COLONOSCOPY;  Surgeon: Jonathan Wood;  Location: [facility] ENDOSCOPY;  Service: Gastroenterology;  Laterality: N/A;   ENTEROSCOPY N/A 08/23/2018   Procedure: ENTEROSCOPY;  Surgeon: Meridee Score Netty Starring., Wood;  Location: [facility] ENDOSCOPY;  Service: Gastroenterology;  Laterality: N/A;   HEMOSTASIS CLIP PLACEMENT  08/23/2018   Procedure: HEMOSTASIS CLIP PLACEMENT;  Surgeon: Jonathan Wood;  Location: [facility] ENDOSCOPY;  Service: Gastroenterology;;   IABP INSERTION N/A 09/05/2017   Procedure: IABP INSERTION;  Surgeon: Jonathan Wood;  Location: [facility] INVASIVE CV LAB;  Service: Cardiovascular;  Laterality: N/A;   INSERTION OF IMPLANTABLE LEFT VENTRICULAR ASSIST DEVICE N/A 09/06/2017   Procedure: INSERTION OF IMPLANTABLE LEFT VENTRICULAR ASSIST DEVICE - HM3;  Surgeon: Jonathan Wood;  Location: [facility] OR;  Service: Open Heart Surgery;  Laterality: N/A;  HM3   MUSCLE FLAP CLOSURE N/A 09/05/2019   Procedure: MUSCLE FLAP CLOSURE;  Surgeon: Jonathan Wood;  Location: [facility] OR;  Service: Plastics;  Laterality: N/A;   NASAL FRACTURE SURGERY  1987   POLYPECTOMY  08/23/2018   Procedure: POLYPECTOMY;   Surgeon: Mansouraty, Netty Starring., Wood;  Location: [facility] ENDOSCOPY;  Service: Gastroenterology;;   RIGHT HEART CATH N/A 08/30/2017   Procedure: RIGHT HEART CATH;  Surgeon: Jonathan Wood;  Location: [facility] INVASIVE CV LAB;  Service: Cardiovascular;  Laterality: N/A;   RIGHT/LEFT HEART CATH AND CORONARY ANGIOGRAPHY N/A 10/08/2016   Procedure: Right/Left Heart Cath and Coronary Angiography;  Surgeon: Orpah Cobb, Wood;  Location: [facility] INVASIVE CV LAB;  Service: Cardiovascular;  Laterality: N/A;   STERNAL WOUND DEBRIDEMENT N/A 08/20/2019   Procedure: DEBRIDEMENT OF LVAD DRIVELINE TUNNEL;  Surgeon: Jonathan Wood;  Location: [facility] OR;  Service: Thoracic;  Laterality: N/A;   STERNAL WOUND DEBRIDEMENT N/A 09/05/2019   Procedure: DEBRIDEMENT AND CLOSURE OF ABDOMINAL WOUND;  Surgeon: Jonathan Wood;  Location: [facility] OR;  Service: Plastics;  Laterality: N/A;   SUBMUCOSAL TATTOO INJECTION  08/23/2018   Procedure: SUBMUCOSAL TATTOO INJECTION;  Surgeon: Meridee Score Netty Starring., Wood;  Location: [facility] ENDOSCOPY;  Service: Gastroenterology;;   TEE WITHOUT CARDIOVERSION N/A 09/06/2017   Procedure: TRANSESOPHAGEAL ECHOCARDIOGRAM (TEE);  Surgeon: Donata Clay, Theron Arista, Wood;  Location: [facility] OR;  Service: Open Heart Surgery;  Laterality: N/A;   WOUND DEBRIDEMENT  N/A 08/30/2019   Procedure: Debridement Abdominal Wound;  Surgeon: Jonathan Wood;  Location: [facility] OR;  Service: Thoracic;  Laterality: N/A;     Current Outpatient Medications  Medication Sig Dispense Refill   acetaminophen (TYLENOL) 500 MG tablet Take 1,500 mg by mouth 3 (three) times daily as needed for headache (pain).     albuterol (PROAIR HFA) 108 (90 Base) MCG/ACT inhaler Inhale 2 puffs into the lungs every 6 (six) hours as needed for wheezing or shortness of breath. 1 each 6   cephALEXin (KEFLEX) 500 MG capsule Take 1 capsule (500 mg total) by mouth 3 (three) times daily. 90 capsule 11   DULoxetine (CYMBALTA) 60 MG capsule Take 1 capsule (60 mg total) by  mouth daily. 30 capsule 6   gabapentin (NEURONTIN) 300 MG capsule TAKE ONE CAPSULE BY MOUTH THREE TIMES DAILY 90 capsule 6   nicotine (NICODERM CQ - DOSED IN MG/24 HOURS) 21 mg/24hr patch Place 1 patch (21 mg total) onto the skin daily. 28 patch 3   pantoprazole (PROTONIX) 40 MG tablet Take 1 tablet (40 mg total) by mouth 2 (two) times daily. (Patient taking differently: Take 40 mg by mouth 2 (two) times daily. Taking once daily) 30 tablet 6   sacubitril-valsartan (ENTRESTO) 97-103 MG Take 1 tablet by mouth 2 (two) times daily. 60 tablet 6   sildenafil (REVATIO) 20 MG tablet Take 1 tablet (20 mg total) by mouth 3 (three) times daily. 90 tablet 11   spironolactone (ALDACTONE) 25 MG tablet Take 0.5 tablets (12.5 mg total) by mouth daily. 30 tablet 6   torsemide (DEMADEX) 20 MG tablet Take 1 tablet (20 mg total) by mouth every other day. (Patient not taking: No sig reported) 90 tablet 3   warfarin (COUMADIN) 5 MG tablet Take 7.5 mg (1.5 tab) every Monday/Wednesday/Friday and 1 tablet (5 mg) all other days OR as directed by HF clinic 180 tablet 6   No current facility-administered medications for this encounter.    Allergies:   Patient has no known allergies.   Social History:  The patient  reports that he has quit smoking. His smoking use included cigarettes. He has a 12.50 pack-year smoking history. He has never used smokeless tobacco. He reports that he does not currently use alcohol. He reports that he does not currently use drugs after having used the following drugs: Marijuana.   Family History:  The patient's family history includes Alcohol abuse in his cousin; Hypertension in his father.   ROS:  Please see the history of present illness.   All other systems are personally reviewed and negative.   Vital Signs: Temp: 98.4 Doppler Pressure: 110 Automatc BP: 110/71 (87) HR: 93 SPO2: UTO   Weight: 207 lbs w/ eqt Last wt: 215.8 lbs w/ equip    Exam: General:  NAD.  HEENT: normal   Neck: supple. JVP not elevated.  Carotids 2+ bilat; no bruits. No lymphadenopathy or thryomegaly appreciated. Cor: LVAD hum.  Lungs: Clear. Abdomen: obese soft, nontender, non-distended. No hepatosplenomegaly. No bruits or masses. Good bowel sounds. Driveline site clean. Anchor in place.  Extremities: no cyanosis, clubbing, rash. Warm no edema  Neuro: alert & oriented x 3. No focal deficits. Moves all 4 without problem     Recent Labs: 03/15/2020: Magnesium 1.8 02/18/2021: ALT 17; BUN 8; Creatinine, Ser 0.89; Hemoglobin 14.3; Platelets 188; Potassium 3.7; Sodium 135    Wt Readings from Last 3 Encounters:  02/18/21 93.9 kg (207 lb)  11/26/20 97.9 kg (215  lb 12.8 oz)  09/03/20 95.4 kg (210 lb 6.4 oz)      ASSESSMENT AND PLAN:  1. 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 Treated with milrinone and then weaned off.  Diuresed 40 pounds. VAD speed increased 5600 -> 6300 - He is doing amazingly well. NYHA I. Volume status looks great. Off all diuretics.  - Continue Entresto and spiro.  - We again discussed transplant. He is interested but need to stop smoking and have 6 months of negative cotinine tests  2.  ETOH pancreatitis, 11/21 - resolved.  - says he has quit drinking (was stressed about his son) - congratulated him on the progress  4. Recurrent DL infection with subxiphoid abscess - s/p debridement and pec flap in 4/21 - remains on keflex. Site continues to look good - He is not a candidate for pump exchange given severe RV dysfunction and poor self-care - WBC now back down   5. HM3 LVAD 09/06/2017.  - VAD interrogated personally. Parameters stable. - LDH 182 - INR 1.5 goal 2.0-2.5  Discussed dosing with PharmD personally. - Off ASA with PUD.  - hgb 14.3 - DL site looks good  6.  Essential HTN - MAPS look good on Entresto and spiro  7.  OSA - sleep study with very  severe OSA (AHI 69/hr) - He has refused therapy multiple times but continues to lose weight. We have offered repeat testing to see if OSA has improved but he refuses.   8. H/o GI bleed  - 08/2018 EGD showed gastritis and nonbleeding duodenal ulcers, colonoscopy with 5 polyps removed.  - Remains on PPI. Having some GERD. Will double protonix - No recent GI bleeding - hgb 14.3  - He will stay off aspirin for now.   9. Tobacco Abuse  - continues to smoke a few cigs/day - we discussed need to quit  Total time spent 45 minutes. Over half that time spent discussing above.    Signed, Arvilla Meres, Wood  02/19/2021 9:10 PM  [facility] [address] Heart and Vascular [place] [address] [phone] (office) [phone] (fax)

## 2021-02-23 LAB — NICOTINE/COTININE METABOLITES
Cotinine: 175.4 ng/mL
Nicotine: 8.8 ng/mL

## 2021-02-25 ENCOUNTER — Other Ambulatory Visit (HOSPITAL_COMMUNITY): Payer: Medicare Other

## 2021-02-26 ENCOUNTER — Ambulatory Visit (HOSPITAL_COMMUNITY): Payer: Self-pay | Admitting: Pharmacist

## 2021-02-26 ENCOUNTER — Other Ambulatory Visit: Payer: Self-pay

## 2021-02-26 ENCOUNTER — Other Ambulatory Visit (HOSPITAL_COMMUNITY): Payer: Self-pay | Admitting: *Deleted

## 2021-02-26 ENCOUNTER — Ambulatory Visit (HOSPITAL_COMMUNITY)
Admission: RE | Admit: 2021-02-26 | Discharge: 2021-02-26 | Disposition: A | Discharge status: Home / Self Care | Payer: Medicare Other | Source: Ambulatory Visit | Attending: Internal Medicine | Admitting: Internal Medicine

## 2021-02-26 DIAGNOSIS — Z4801 Encounter for change or removal of surgical wound dressing: Secondary | ICD-10-CM | POA: Insufficient documentation

## 2021-02-26 DIAGNOSIS — Z95811 Presence of heart assist device: Secondary | ICD-10-CM | POA: Diagnosis not present

## 2021-02-26 DIAGNOSIS — Z7901 Long term (current) use of anticoagulants: Secondary | ICD-10-CM | POA: Insufficient documentation

## 2021-02-26 LAB — PROTIME-INR
INR: 1.6 — ABNORMAL HIGH (ref 0.8–1.2)
Prothrombin Time: 19.1 seconds — ABNORMAL HIGH (ref 11.4–15.2)

## 2021-02-26 NOTE — Progress Notes (Signed)
LVAD INR 

## 2021-02-26 NOTE — Progress Notes (Signed)
Patient presents for dressing change and INR in VAD clinic today alone. Denies any issues with his VAD or alarms.    Exit Site Care: Drive line is being maintained by VAD coordinators. Existing VAD dressing removed and site care performed using sterile technique. Drive line exit site cleaned with CHG swabs x 2 and rinsed with saline, allowed to dry, and Sorbaview dressing with silverlon patch applied. Anchor reapplied and covered with two large tegaderms.  Drive line exit site incorporated, the velour is fully implanted at exit site. No drainage, erythema, tenderness, or foul odor noted.  Pt denies fever or chills. Continue weekly dressing changes. VAD coordinators to perform weekly dressing changes.   Plan: Return to clinic next in one week for INR and dsg change.  PhamrD will call you with Coumadin instructions and INR dosing.   Gail Creekmore RN VAD Coordinator  Office: [phone]  24/7 Pager: [phone]             

## 2021-03-05 ENCOUNTER — Other Ambulatory Visit (HOSPITAL_COMMUNITY): Payer: Medicare Other

## 2021-03-11 ENCOUNTER — Other Ambulatory Visit: Payer: Self-pay

## 2021-03-11 ENCOUNTER — Ambulatory Visit (HOSPITAL_COMMUNITY): Payer: Self-pay | Admitting: Pharmacist

## 2021-03-11 ENCOUNTER — Ambulatory Visit (HOSPITAL_COMMUNITY)
Admission: RE | Admit: 2021-03-11 | Discharge: 2021-03-11 | Disposition: A | Discharge status: Home / Self Care | Payer: Medicare Other | Source: Ambulatory Visit | Attending: Internal Medicine | Admitting: Internal Medicine

## 2021-03-11 DIAGNOSIS — Z7901 Long term (current) use of anticoagulants: Secondary | ICD-10-CM | POA: Insufficient documentation

## 2021-03-11 DIAGNOSIS — Z95811 Presence of heart assist device: Secondary | ICD-10-CM | POA: Diagnosis not present

## 2021-03-11 LAB — PROTIME-INR
INR: 1.4 — ABNORMAL HIGH (ref 0.8–1.2)
Prothrombin Time: 17.1 seconds — ABNORMAL HIGH (ref 11.4–15.2)

## 2021-03-11 NOTE — Progress Notes (Signed)
LVAD INR 

## 2021-03-11 NOTE — Progress Notes (Addendum)
Patient presents for dressing change and INR in VAD clinic today alone. Denies any issues with his VAD or alarms.   Pt states that he feels great. He is requesting I check his BP today just "make sure I'm on target." BP 110/76 (98) today in clinic.   Exit Site Care: Drive line is being maintained by VAD coordinators. Existing VAD dressing removed and site care performed using sterile technique. Drive line exit site cleaned with CHG swabs x 2 and rinsed with saline, allowed to dry, and Sorbaview dressing with silverlon patch applied. Anchor reapplied and covered with two large tegaderms.  Drive line exit site incorporated, the velour is fully implanted at exit site. No drainage, erythema, tenderness, or foul odor noted.  Pt denies fever or chills. Continue weekly dressing changes. VAD coordinators to perform weekly dressing changes.   Plan: Return to clinic next in one week for INR and dsg change.  Pharm-D will call you with Coumadin instructions and INR dosing.  Carlton Adam RN VAD Coordinator  Office: [phone]  24/7 Pager: [phone]

## 2021-03-12 ENCOUNTER — Other Ambulatory Visit (HOSPITAL_COMMUNITY): Payer: Self-pay | Admitting: *Deleted

## 2021-03-12 DIAGNOSIS — Z95811 Presence of heart assist device: Secondary | ICD-10-CM

## 2021-03-12 DIAGNOSIS — Z7901 Long term (current) use of anticoagulants: Secondary | ICD-10-CM

## 2021-03-18 ENCOUNTER — Other Ambulatory Visit (HOSPITAL_COMMUNITY): Payer: Medicare Other

## 2021-03-20 ENCOUNTER — Ambulatory Visit (HOSPITAL_COMMUNITY)
Admission: RE | Admit: 2021-03-20 | Discharge: 2021-03-20 | Disposition: A | Discharge status: Home / Self Care | Payer: Medicare Other | Source: Ambulatory Visit | Attending: Cardiology | Admitting: Cardiology

## 2021-03-20 ENCOUNTER — Ambulatory Visit (HOSPITAL_COMMUNITY): Payer: Self-pay | Admitting: Pharmacist

## 2021-03-20 ENCOUNTER — Other Ambulatory Visit: Payer: Self-pay

## 2021-03-20 DIAGNOSIS — Z95811 Presence of heart assist device: Secondary | ICD-10-CM | POA: Insufficient documentation

## 2021-03-20 DIAGNOSIS — Z5181 Encounter for therapeutic drug level monitoring: Secondary | ICD-10-CM | POA: Insufficient documentation

## 2021-03-20 DIAGNOSIS — Z7901 Long term (current) use of anticoagulants: Secondary | ICD-10-CM | POA: Diagnosis not present

## 2021-03-20 LAB — PROTIME-INR
INR: 1.9 — ABNORMAL HIGH (ref 0.8–1.2)
Prothrombin Time: 21.7 seconds — ABNORMAL HIGH (ref 11.4–15.2)

## 2021-03-20 NOTE — Progress Notes (Signed)
Patient presents for dressing change and INR in VAD clinic today alone. Denies any issues with his VAD or alarms.    Exit Site Care: Drive line is being maintained by VAD coordinators. Existing VAD dressing removed and site care performed using sterile technique. Drive line exit site cleaned with CHG swabs x 2 and rinsed with saline, allowed to dry, and Sorbaview dressing with silverlon patch applied. Anchor reapplied and covered with two large tegaderms.  Drive line exit site incorporated, the velour is fully implanted at exit site. No drainage, erythema, tenderness, or foul odor noted.  Pt denies fever or chills. Continue weekly dressing changes. VAD coordinators to perform weekly dressing changes.   Plan: Return to clinic next in one week for INR and dsg change.  Pharm-D will call you with Coumadin instructions and INR dosing.  Hessie Diener RN VAD Coordinator  Office: [phone]  24/7 Pager: [phone]

## 2021-03-20 NOTE — Progress Notes (Signed)
LVAD INR 

## 2021-03-26 ENCOUNTER — Other Ambulatory Visit (HOSPITAL_COMMUNITY): Payer: Self-pay | Admitting: *Deleted

## 2021-03-26 DIAGNOSIS — Z7901 Long term (current) use of anticoagulants: Secondary | ICD-10-CM

## 2021-03-26 DIAGNOSIS — Z95811 Presence of heart assist device: Secondary | ICD-10-CM

## 2021-03-27 ENCOUNTER — Other Ambulatory Visit (HOSPITAL_COMMUNITY): Payer: Medicare Other

## 2021-03-31 ENCOUNTER — Other Ambulatory Visit: Payer: Self-pay

## 2021-03-31 ENCOUNTER — Ambulatory Visit (HOSPITAL_COMMUNITY): Payer: Self-pay | Admitting: Pharmacist

## 2021-03-31 ENCOUNTER — Ambulatory Visit (HOSPITAL_COMMUNITY)
Admission: RE | Admit: 2021-03-31 | Discharge: 2021-03-31 | Disposition: A | Discharge status: Home / Self Care | Payer: Medicare Other | Source: Ambulatory Visit | Attending: Internal Medicine | Admitting: Internal Medicine

## 2021-03-31 DIAGNOSIS — Z7901 Long term (current) use of anticoagulants: Secondary | ICD-10-CM | POA: Insufficient documentation

## 2021-03-31 DIAGNOSIS — Z95811 Presence of heart assist device: Secondary | ICD-10-CM | POA: Diagnosis not present

## 2021-03-31 LAB — PROTIME-INR
INR: 1.8 — ABNORMAL HIGH (ref 0.8–1.2)
Prothrombin Time: 21 seconds — ABNORMAL HIGH (ref 11.4–15.2)

## 2021-03-31 NOTE — Progress Notes (Signed)
LVAD INR 

## 2021-03-31 NOTE — Progress Notes (Signed)
Patient presents for dressing change and INR in VAD clinic today alone. Pt states that his controller keeps beeping.   On assessment, modular cable is bent and wires are exposed. Interrogation was negative for any alarms. Mod cable changed in clinic today. New modular cable(Lot: [id number]) was placed in backup controller and changed out.     Exit Site Care: Drive line is being maintained by VAD coordinators. Existing VAD dressing removed and site care performed using sterile technique. Drive line exit site cleaned with CHG swabs x 2 and rinsed with saline, allowed to dry, and Sorbaview dressing with silverlon patch applied. Anchor reapplied and covered with two large tegaderms.  Drive line exit site incorporated, the velour is fully implanted at exit site. No drainage, erythema, tenderness, or foul odor noted.  Pt denies fever or chills. Continue weekly dressing changes. VAD coordinators to perform weekly dressing changes.   Plan: Return to clinic next in one week for INR and dsg change.  Pharm-D will call you with Coumadin instructions and INR dosing.   Carlton Adam RN VAD Coordinator  Office: [phone]  24/7 Pager: [phone]

## 2021-04-03 ENCOUNTER — Other Ambulatory Visit (HOSPITAL_COMMUNITY): Payer: Self-pay | Admitting: Unknown Physician Specialty

## 2021-04-03 DIAGNOSIS — Z7901 Long term (current) use of anticoagulants: Secondary | ICD-10-CM

## 2021-04-03 DIAGNOSIS — Z95811 Presence of heart assist device: Secondary | ICD-10-CM

## 2021-04-07 ENCOUNTER — Other Ambulatory Visit (HOSPITAL_COMMUNITY): Payer: Medicare Other

## 2021-04-11 ENCOUNTER — Telehealth (HOSPITAL_COMMUNITY): Payer: Self-pay | Admitting: Unknown Physician Specialty

## 2021-04-11 DIAGNOSIS — Z95811 Presence of heart assist device: Secondary | ICD-10-CM

## 2021-04-11 MED ORDER — WARFARIN SODIUM 5 MG PO TABS: ORAL_TABLET | ORAL | 6 refills | Status: DC

## 2021-04-11 NOTE — Telephone Encounter (Signed)
Received page from pt stating that the pharmacy will not fill his coumadin "because it is not time." I called [facility] and spoke with [facility]. Pharmacy has old dose of coumadin on file for pt. I have sent updated script to [facility]. Also, rescheduled pts appt for dressing change/INR since he no showed last week.   Carlton Adam RN, BSN VAD Coordinator 24/7 Pager [phone]

## 2021-04-13 ENCOUNTER — Other Ambulatory Visit (HOSPITAL_COMMUNITY): Payer: Medicare Other

## 2021-04-13 ENCOUNTER — Other Ambulatory Visit (HOSPITAL_COMMUNITY): Payer: Self-pay | Admitting: *Deleted

## 2021-04-13 DIAGNOSIS — Z95811 Presence of heart assist device: Secondary | ICD-10-CM

## 2021-04-13 DIAGNOSIS — I420 Dilated cardiomyopathy: Secondary | ICD-10-CM

## 2021-04-13 DIAGNOSIS — I5022 Chronic systolic (congestive) heart failure: Secondary | ICD-10-CM

## 2021-04-13 DIAGNOSIS — Z7901 Long term (current) use of anticoagulants: Secondary | ICD-10-CM

## 2021-04-13 MED ORDER — PANTOPRAZOLE SODIUM 40 MG PO TBEC: 40.0000 mg | DELAYED_RELEASE_TABLET | Freq: Two times a day (BID) | ORAL | 3 refills | Status: DC

## 2021-04-15 ENCOUNTER — Other Ambulatory Visit (HOSPITAL_COMMUNITY): Payer: Medicare Other

## 2021-04-20 ENCOUNTER — Other Ambulatory Visit (HOSPITAL_COMMUNITY): Payer: Medicare Other

## 2021-04-23 ENCOUNTER — Other Ambulatory Visit (HOSPITAL_COMMUNITY): Payer: Self-pay | Admitting: *Deleted

## 2021-04-23 ENCOUNTER — Ambulatory Visit (HOSPITAL_COMMUNITY): Payer: Self-pay | Admitting: Pharmacist

## 2021-04-23 ENCOUNTER — Ambulatory Visit (HOSPITAL_COMMUNITY)
Admission: RE | Admit: 2021-04-23 | Discharge: 2021-04-23 | Disposition: A | Discharge status: Home / Self Care | Payer: Medicare Other | Source: Ambulatory Visit | Attending: Cardiology | Admitting: Cardiology

## 2021-04-23 ENCOUNTER — Other Ambulatory Visit: Payer: Self-pay

## 2021-04-23 DIAGNOSIS — Z7901 Long term (current) use of anticoagulants: Secondary | ICD-10-CM | POA: Diagnosis not present

## 2021-04-23 DIAGNOSIS — Z95811 Presence of heart assist device: Secondary | ICD-10-CM

## 2021-04-23 DIAGNOSIS — Z4801 Encounter for change or removal of surgical wound dressing: Secondary | ICD-10-CM | POA: Insufficient documentation

## 2021-04-23 DIAGNOSIS — Z5181 Encounter for therapeutic drug level monitoring: Secondary | ICD-10-CM | POA: Diagnosis not present

## 2021-04-23 LAB — PROTIME-INR
INR: 1.8 — ABNORMAL HIGH (ref 0.8–1.2)
Prothrombin Time: 21.2 seconds — ABNORMAL HIGH (ref 11.4–15.2)

## 2021-04-23 NOTE — Progress Notes (Signed)
LVAD INR 

## 2021-04-23 NOTE — Progress Notes (Signed)
Patient presents for dressing change and INR in VAD clinic today alone.    Exit Site Care: Drive line is being maintained by VAD coordinators. Existing VAD dressing removed and site care performed using sterile technique. Drive line exit site cleaned with CHG swabs x 2 and rinsed with saline, allowed to dry, and Sorbaview dressing with silverlon patch applied. Anchor reapplied and covered with two large tegaderms.  Drive line exit site incorporated, the velour is fully implanted at exit site. No drainage, erythema, tenderness, or foul odor noted.  Pt denies fever or chills. Continue weekly dressing changes. VAD coordinators to perform weekly dressing changes.   Plan: Return to clinic next in one week for Ia full visit with Dr. [name] Pharm-D will call you with Coumadin instructions and INR dosing.   Carlton Adam RN VAD Coordinator  Office: [phone]  24/7 Pager: [phone]

## 2021-04-27 ENCOUNTER — Other Ambulatory Visit (HOSPITAL_COMMUNITY): Payer: Self-pay | Admitting: *Deleted

## 2021-04-27 DIAGNOSIS — Z7901 Long term (current) use of anticoagulants: Secondary | ICD-10-CM

## 2021-04-27 DIAGNOSIS — Z95811 Presence of heart assist device: Secondary | ICD-10-CM

## 2021-04-29 ENCOUNTER — Encounter (HOSPITAL_COMMUNITY): Payer: Self-pay

## 2021-04-29 ENCOUNTER — Other Ambulatory Visit (HOSPITAL_COMMUNITY): Payer: Self-pay | Admitting: *Deleted

## 2021-04-29 ENCOUNTER — Ambulatory Visit (HOSPITAL_COMMUNITY)
Admission: RE | Admit: 2021-04-29 | Discharge: 2021-04-29 | Disposition: A | Discharge status: Home / Self Care | Payer: Medicare Other | Source: Ambulatory Visit | Attending: Cardiology | Admitting: Cardiology

## 2021-04-29 ENCOUNTER — Ambulatory Visit (HOSPITAL_COMMUNITY): Payer: Self-pay | Admitting: Pharmacist

## 2021-04-29 ENCOUNTER — Other Ambulatory Visit: Payer: Self-pay

## 2021-04-29 VITALS — BP 127/93 | HR 87 | Wt 201.8 lb

## 2021-04-29 DIAGNOSIS — Z95811 Presence of heart assist device: Secondary | ICD-10-CM | POA: Diagnosis not present

## 2021-04-29 DIAGNOSIS — K859 Acute pancreatitis without necrosis or infection, unspecified: Secondary | ICD-10-CM | POA: Diagnosis not present

## 2021-04-29 DIAGNOSIS — I10 Essential (primary) hypertension: Secondary | ICD-10-CM | POA: Diagnosis not present

## 2021-04-29 DIAGNOSIS — Z7901 Long term (current) use of anticoagulants: Secondary | ICD-10-CM

## 2021-04-29 DIAGNOSIS — F17210 Nicotine dependence, cigarettes, uncomplicated: Secondary | ICD-10-CM | POA: Insufficient documentation

## 2021-04-29 DIAGNOSIS — I110 Hypertensive heart disease with heart failure: Secondary | ICD-10-CM | POA: Insufficient documentation

## 2021-04-29 DIAGNOSIS — G4733 Obstructive sleep apnea (adult) (pediatric): Secondary | ICD-10-CM | POA: Diagnosis not present

## 2021-04-29 DIAGNOSIS — Z8711 Personal history of peptic ulcer disease: Secondary | ICD-10-CM | POA: Insufficient documentation

## 2021-04-29 DIAGNOSIS — Z79899 Other long term (current) drug therapy: Secondary | ICD-10-CM | POA: Insufficient documentation

## 2021-04-29 DIAGNOSIS — Z4801 Encounter for change or removal of surgical wound dressing: Secondary | ICD-10-CM | POA: Insufficient documentation

## 2021-04-29 DIAGNOSIS — Z720 Tobacco use: Secondary | ICD-10-CM

## 2021-04-29 DIAGNOSIS — I5022 Chronic systolic (congestive) heart failure: Secondary | ICD-10-CM | POA: Diagnosis not present

## 2021-04-29 LAB — BASIC METABOLIC PANEL
Anion gap: 7 (ref 5–15)
BUN: 9 mg/dL (ref 6–20)
CO2: 22 mmol/L (ref 22–32)
Calcium: 8.9 mg/dL (ref 8.9–10.3)
Chloride: 109 mmol/L (ref 98–111)
Creatinine, Ser: 1.03 mg/dL (ref 0.61–1.24)
GFR, Estimated: >60 mL/min (ref >60)
Glucose, Bld: 128 mg/dL — ABNORMAL HIGH (ref 70–99)
Potassium: 3.7 mmol/L (ref 3.5–5.1)
Sodium: 138 mmol/L (ref 135–145)

## 2021-04-29 LAB — CBC
HCT: 37.9 % — ABNORMAL LOW (ref 39.0–52.0)
Hemoglobin: 12.1 g/dL — ABNORMAL LOW (ref 13.0–17.0)
MCH: 28.3 pg (ref 26.0–34.0)
MCHC: 31.9 g/dL (ref 30.0–36.0)
MCV: 88.8 fL (ref 80.0–100.0)
Platelets: 219 10*3/uL (ref 150–400)
RBC: 4.27 MIL/uL (ref 4.22–5.81)
RDW: 13.3 % (ref 11.5–15.5)
WBC: 6.6 10*3/uL (ref 4.0–10.5)
nRBC: 0 % (ref 0.0–0.2)

## 2021-04-29 LAB — PROTIME-INR
INR: 1.8 — ABNORMAL HIGH (ref 0.8–1.2)
Prothrombin Time: 20.6 seconds — ABNORMAL HIGH (ref 11.4–15.2)

## 2021-04-29 LAB — LACTATE DEHYDROGENASE: LDH: 203 U/L — ABNORMAL HIGH (ref 98–192)

## 2021-04-29 NOTE — Patient Instructions (Signed)
No medication changes today Coumadin dosing per Lauren PharmD Return to VAD clinic in 1 week for dressing change Return to VAD clinic in 2 months for follow up with Dr Bensimhon 

## 2021-04-29 NOTE — Progress Notes (Addendum)
Patient presents for 2 mo f/u in VAD clinic today alone. Denies any issues with VAD or equipment.   Pt states he has been feeling great. Denies lightheadedness, falls, shortness of breath, or signs of bleeding. He is drinking 2 L daily. Denies ETOH use. Smoking 4 cigarettes daily. He is eating healthier, and exercising daily.   He recently started a new job working for [employer] and [employer]. He is working Monday through Friday 3 pm -7 pm. 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 He loves it.   Pt reports increasing Protonix has stopped acid reflux. Reports occasional rib rub from pump pocket. He is taking Gabapentin TID and PRN Tylenol for this. Reports he stopped taking Cymbalta about 3 weeks ago because "I'm not depressed anymore." Discussed that he needs to continue medication, even when he feels he is not depressed as medication is working. He verbalized understanding, and plans to restart Cymbalta today.   He has an eye exam scheduled for this Friday.   Vital Signs: Temp: 98.1 Doppler Pressure: 120 Automatc BP: 127/93 (104) HR: 87 SPO2: UTO   Weight: 201.8 lbs w/ eqt Last wt: 207 lbs w/ equip   VAD Indication: Destination Therapy    VAD interrogation & Equipment Management: Speed: 6500 Flow: 6.0 Power: 6.0 w    PI: 2.9 Hct: 44  Alarms: none Events: 35 - 60+ daily  Primary Controller:  Replace back up battery in 21 months Back up controller: did not bring  Annual Equipment Maintenance on UBC/PM was performed 09/03/20   I reviewed the LVAD parameters from today and compared the results to the patient's prior recorded data.  LVAD interrogation was NEGATIVE for significant power changes, NEGATIVE for clinical alarms and STABLE for PI events/speed drops. No programming changes were made and pump is functioning within specified parameters.   Exit Site Care: Drive line is being maintained weekly by VAD coordinators. Existing VAD dressing removed and site care performed using  sterile technique. Drive line exit site cleaned with Chlora prep applicators x 2, RINSED WITH SALINE allowed to dry, and Sorbaview dressing with Silverlon patch reapplied. Anchor reapplied. Covered with a large tegaderms. Drive line exit site incorporated, the velour is fully implanted at exit site. No drainage, erythema, tenderness, or foul odor noted.  Pt denies fever or chills. Continue weekly dressing changes. VAD coordinators to perform weekly dressing changes. Pt has adequate dressing supplies at home.   Device: none  BP & Labs:  Doppler 120 - reflecting modified systolic   Hgb 12.1 - No S/S of bleeding. Specifically denies melena/BRBPR or nosebleeds.   LDH 203- established baseline of 200-300. Denies tea-colored urine. No power elevations noted on interrogation.  Patient Instructions:  No medication changes today Coumadin dosing per Lauren PharmD Return to VAD clinic in 1 week for dressing change Return to VAD clinic in 2 months for follow up with Dr [name]  Alyce Pagan RN VAD Coordinator  Office: [phone]  24/7 Pager: [phone]

## 2021-04-29 NOTE — Progress Notes (Signed)
VAD Clinic Note   Date:  04/29/2021   ID:  Jonathan Wood, DOB 1970/09/16, MRN [id number]  Location: Home  Provider location: [facility] Type of Visit: Established patient  PCP: [name]  Cardiologist:  Arvilla Meres, MD Primary HF: Dr [name]   Chief Complaint: Heart Failure/LVAD  History of Present Illness:  Jonathan Wood is a 50 y.o. 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  Admitted 4/20 with volume overload and anemia. Diuresed with IV lasix and transitioned to torsemide 20 mg daily. GI consulted EGD with gastritis and nonbleeding duodenal ulcers, colonoscopy with 5 polyps removed.  Admitted 12/20 for DL infection with overlying cellulitis and volume overload.   Admitted 4/21 for DL infection with large subxiphoid abscess. Underwent several debridements and eventually a pec muscle flap. Wound cx + MSSA. Initially on ancef and rifabutin but expanded to cefipime then changed to IV ancef and po rifabutin. Now on oral keflex.  Readmitted 6/21 with superficial abscess and tachypnea. Superficial abscess I&D'd and packed. CT scan done and showed possible deeper fluid collection. Reviewed with TCTS who felt it was muscle flap. Sent to IR for possible drainage but u/s confirmed it was the muscle flap and no drainable fluid collection. Wound cx negative. ID saw and recommended continuing Keflex.   Admitted 8/21 with lactic acidosis due to cardiogenic shock. Started on 0.25 of milrinone and NE. Ramp echo on 8/13 with severe RV dysfunction. Speed increased 5600-> 6300. Tolerated ok. Repeat ramp echo on 8/20 speed increased to 6500. Diuresed over 40 pounds.   Admitted 10/21 with  ETOH pancreatitis. Gallbladder ok. Resolved with medical management   Here for VAD f/u. Says he feels great. 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 Remains active no  sob. Not drinking at all. Occasional cigarette. No need for diuretics. Denies orthopnea or PND. No fevers, chills or problems with driveline. No bleeding, melena or neuro symptoms. No VAD alarms. Taking all meds as prescribed.    VAD Indication: Destination Therapy    VAD interrogation & Equipment Management: Speed: 6500 Flow: 6.0 Power: 6.0 w    PI: 2.9 Hct: 44   Alarms: none Events: 35 - 60+ daily   Primary Controller:  Replace back up battery in 21 months Back up controller: did not bring   Annual Equipment Maintenance on UBC/PM was performed 09/03/20     Past Medical History:  Diagnosis Date   Asthma    CHF (congestive heart failure) (HCC)    a. 09/2016: EF 20-25% with cath showing normal cors   GERD (gastroesophageal reflux disease)    History of hiatal hernia    LVAD (left ventricular assist device) present (HCC)    OSA on CPAP 09/06/2018   Severe OSA with AHI 68/hr on CPAP at 12cm H2O   Past Surgical History:  Procedure Laterality Date   APPLICATION OF A-CELL OF CHEST/ABDOMEN N/A 08/24/2019   Procedure: Application Of A-Cell Of Chest/Abdomen;  Surgeon: Kerin Perna, MD;  [facility];  Service: Thoracic;  Laterality: N/A;   APPLICATION OF A-CELL OF CHEST/ABDOMEN N/A 09/05/2019   Procedure: Application Of A-Cell Of Chest/Abdomen;  Surgeon: Peggye Form, DO;  Location: [facility] OR;  Service: Plastics;  Laterality: N/A;   APPLICATION OF A-CELL OF CHEST/ABDOMEN  09/03/2019   Procedure: Application Of A-Cell Of Chest/Abdomen;  Surgeon: Kerin Perna, MD;  Location: [facility] OR;  Service: Thoracic;;   APPLICATION OF WOUND VAC N/A 08/22/2019   Procedure: Debridement, Irrigation and Packing of Abdominal Incision.;  Surgeon: Kerin Perna, MD;  Location: [facility] OR;  Service: Thoracic;  Laterality: N/A;   APPLICATION OF WOUND VAC N/A 08/24/2019   Procedure: Irrigation and Debridement with WOUND VAC APPLICATION;  Surgeon: Kerin Perna, MD;  Location: [facility] OR;  Service: Thoracic;   Laterality: N/A;   APPLICATION OF WOUND VAC N/A 08/30/2019   Procedure: APPLICATION OF WOUND VAC;  Surgeon: Kerin Perna, MD;  Location: [facility] OR;  Service: Thoracic;  Laterality: N/A;   APPLICATION OF WOUND VAC N/A 09/03/2019   Procedure: WOUND VAC CHANGE;  Surgeon: Kerin Perna, MD;  Location: [facility] OR;  Service: Thoracic;  Laterality: N/A;   BIOPSY  08/23/2018   Procedure: BIOPSY;  Surgeon: Lemar Lofty., MD;  Location: [facility] ENDOSCOPY;  Service: Gastroenterology;;   COLONOSCOPY N/A 08/23/2018   Procedure: COLONOSCOPY;  Surgeon: Lemar Lofty., MD;  Location: [facility] ENDOSCOPY;  Service: Gastroenterology;  Laterality: N/A;   ENTEROSCOPY N/A 08/23/2018   Procedure: ENTEROSCOPY;  Surgeon: Meridee Score Netty Starring., MD;  Location: [facility] ENDOSCOPY;  Service: Gastroenterology;  Laterality: N/A;   HEMOSTASIS CLIP PLACEMENT  08/23/2018   Procedure: HEMOSTASIS CLIP PLACEMENT;  Surgeon: Lemar Lofty., MD;  Location: [facility] ENDOSCOPY;  Service: Gastroenterology;;   IABP INSERTION N/A 09/05/2017   Procedure: IABP INSERTION;  Surgeon: Dolores Patty, MD;  [facility]: [facility] INVASIVE CV LAB;  Service: Cardiovascular;  Laterality: N/A;   INSERTION OF IMPLANTABLE LEFT VENTRICULAR ASSIST DEVICE N/A 09/06/2017   Procedure: INSERTION OF IMPLANTABLE LEFT VENTRICULAR ASSIST DEVICE - HM3;  Surgeon: Kerin Perna, MD;  Location: [facility] OR;  Service: Open Heart Surgery;  Laterality: N/A;  HM3   MUSCLE FLAP CLOSURE N/A 09/05/2019   Procedure: MUSCLE FLAP CLOSURE;  Surgeon: Peggye Form, DO;  Location: [facility] OR;  Service: Plastics;  Laterality: N/A;   NASAL FRACTURE SURGERY  1987   POLYPECTOMY  08/23/2018   Procedure: POLYPECTOMY;  Surgeon: Mansouraty, Netty Starring., MD;  Location: [facility] ENDOSCOPY;  Service: Gastroenterology;;   RIGHT HEART CATH N/A 08/30/2017   Procedure: RIGHT HEART CATH;  Surgeon: Dolores Patty, MD;  Location: [facility] INVASIVE CV LAB;  Service: Cardiovascular;  Laterality: N/A;   RIGHT/LEFT HEART  CATH AND CORONARY ANGIOGRAPHY N/A 10/08/2016   Procedure: Right/Left Heart Cath and Coronary Angiography;  Surgeon: Orpah Cobb, MD;  Location: [facility] INVASIVE CV LAB;  Service: Cardiovascular;  Laterality: N/A;   STERNAL WOUND DEBRIDEMENT N/A 08/20/2019   Procedure: DEBRIDEMENT OF LVAD DRIVELINE TUNNEL;  Surgeon: Kerin Perna, MD;  Location: [facility] OR;  Service: Thoracic;  Laterality: N/A;   STERNAL WOUND DEBRIDEMENT N/A 09/05/2019   Procedure: DEBRIDEMENT AND CLOSURE OF ABDOMINAL WOUND;  Surgeon: Peggye Form, DO;  Location: [facility] OR;  Service: Plastics;  Laterality: N/A;   SUBMUCOSAL TATTOO INJECTION  08/23/2018   Procedure: SUBMUCOSAL TATTOO INJECTION;  Surgeon: Meridee Score Netty Starring., MD;  Location: [facility] ENDOSCOPY;  Service: Gastroenterology;;   TEE WITHOUT CARDIOVERSION N/A 09/06/2017   Procedure: TRANSESOPHAGEAL ECHOCARDIOGRAM (TEE);  Surgeon: Donata Clay, Theron Arista, MD;  Location: [facility] OR;  Service: Open Heart Surgery;  Laterality: N/A;   WOUND DEBRIDEMENT N/A 08/30/2019   Procedure: Debridement Abdominal Wound;  Surgeon: Kerin Perna, MD;  Location: [facility] OR;  Service: Thoracic;  Laterality: N/A;     Current Outpatient Medications  Medication Sig Dispense Refill   acetaminophen (TYLENOL) 500 MG tablet Take  1,500 mg by mouth 3 (three) times daily as needed for headache (pain).     cephALEXin (KEFLEX) 500 MG capsule Take 1 capsule (500 mg total) by mouth 3 (three) times daily. 90 capsule 11   gabapentin (NEURONTIN) 300 MG capsule TAKE ONE CAPSULE BY MOUTH THREE TIMES DAILY 90 capsule 6   pantoprazole (PROTONIX) 40 MG tablet Take 1 tablet (40 mg total) by mouth 2 (two) times daily. 180 tablet 3   sacubitril-valsartan (ENTRESTO) 97-103 MG Take 1 tablet by mouth 2 (two) times daily. 60 tablet 6   sildenafil (REVATIO) 20 MG tablet Take 1 tablet (20 mg total) by mouth 3 (three) times daily. 90 tablet 11   spironolactone (ALDACTONE) 25 MG tablet Take 0.5 tablets (12.5 mg total) by mouth daily. 30 tablet 6    warfarin (COUMADIN) 5 MG tablet Take 7.5 mg (1.5 tab) every Monday/Wednesday/Friday and 1 tablet (5 mg) all other days OR as directed by HF clinic 180 tablet 6   albuterol (PROAIR HFA) 108 (90 Base) MCG/ACT inhaler Inhale 2 puffs into the lungs every 6 (six) hours as needed for wheezing or shortness of breath. (Patient not taking: Reported on 04/29/2021) 1 each 6   DULoxetine (CYMBALTA) 60 MG capsule Take 1 capsule (60 mg total) by mouth daily. (Patient not taking: Reported on 04/29/2021) 30 capsule 6   nicotine (NICODERM CQ - DOSED IN MG/24 HOURS) 21 mg/24hr patch Place 1 patch (21 mg total) onto the skin daily. (Patient not taking: Reported on 04/29/2021) 28 patch 3   torsemide (DEMADEX) 20 MG tablet Take 1 tablet (20 mg total) by mouth every other day. (Patient not taking: Reported on 06/26/2020) 90 tablet 3   No current facility-administered medications for this encounter.    Allergies:   Patient has no known allergies.   Social History:  The patient  reports that he has quit smoking. His smoking use included cigarettes. He has a 12.50 pack-year smoking history. He has never used smokeless tobacco. He reports that he does not currently use alcohol. He reports that he does not currently use drugs after having used the following drugs: Marijuana.   Family History:  The patient's family history includes Alcohol abuse in his cousin; Hypertension in his father.   ROS:  Please see the history of present illness.   All other systems are personally reviewed and negative.   Vital Signs: Temp: 98.1 Doppler Pressure: 120 Automatc BP: 127/93 (104) HR: 87 SPO2: UTO   Weight: 201.8 lbs w/ eqt Last wt: 207 lbs w/ equip    Exam: General:  NAD.  HEENT: normal  Neck: supple. JVP not elevated.  Carotids 2+ bilat; no bruits. No lymphadenopathy or thryomegaly appreciated. Cor: LVAD hum.  Lungs: Clear. Abdomen: soft, nontender, non-distended. No hepatosplenomegaly. No bruits or masses. Good bowel  sounds. Driveline site clean. Anchor in place.  Extremities: no cyanosis, clubbing, rash. Warm no edema  Neuro: alert & oriented x 3. No focal deficits. Moves all 4 without problem     Recent Labs: 02/18/2021: ALT 17 04/29/2021: BUN 9; Creatinine, Ser 1.03; Hemoglobin 12.1; Platelets 219; Potassium 3.7; Sodium 138    Wt Readings from Last 3 Encounters:  04/29/21 91.5 kg (201 lb 12.8 oz)  02/18/21 93.9 kg (207 lb)  11/26/20 97.9 kg (215 lb 12.8 oz)      ASSESSMENT AND PLAN:  1. Chronic systolic HF with prominent RV failure - EF 10% s/p HM-3 LVAD on 09/06/17 - Admitted 8/21 for cardiogenic shock with severe  RV failure. Treated with milrinone and then weaned off.  Diuresed 40 pounds. VAD speed increased 5600 -> 6300 - He continues to do amazingly well. NYHA I. Volume status looks great. Off all diuretics. He seems really motivated to be in better health and participating in his son's athletics. It is great to see. - Continue Entresto and spiro.  - We again discussed transplant. He is interested but need to stop smoking and have 6 months of negative cotinine tests. No change  2.  ETOH pancreatitis, 11/21 - resolved.  - remains quit from drinking (was stressed about his son) - congratulated him on the progress  4. Recurrent DL infection with subxiphoid abscess - s/p debridement and pec flap in 4/21 - remains on keflex. Site continues to look good - He is not a candidate for pump exchange given severe RV dysfunction and poor self-care - Now normal  5. HM3 LVAD 09/06/2017.  - VAD interrogated personally. Parameters stable. - LDH 203 - INR 1.8 goal 2.0-2.5  Discussed dosing with PharmD personally. - Off ASA with PUD.  - hgb 12.1 - DL site looks good  6.  Essential HTN - MAPs mildly elevated today on Entresto and spiro but typically in 80s. Will continue to follow.   7.  OSA - sleep study with very severe OSA (AHI 69/hr) - He has refused therapy multiple times but continues to  lose weight. We have offered repeat testing to see if OSA has improved but he refuses.  - Says he is sleeping much better  8. H/o GI bleed  - 08/2018 EGD showed gastritis and nonbleeding duodenal ulcers, colonoscopy with 5 polyps removed.  - Remains on PPI. No recent GI bleeding - hgb 12.1 - He will stay off aspirin for now.   9. Tobacco Abuse  - continues to smoke a few cigs/day - we again discussed that quitting is required for transplant  Total time spent 40 minutes. Over half that time spent discussing above.    Signed, Arvilla Meres, MD  04/29/2021 10:51 PM  [facility] [address] [facility] V[facility] [place] [address] [phone] (office) [phone] (fax)

## 2021-04-29 NOTE — Progress Notes (Signed)
LVAD INR 

## 2021-04-30 ENCOUNTER — Other Ambulatory Visit (HOSPITAL_COMMUNITY): Payer: Self-pay | Admitting: *Deleted

## 2021-04-30 DIAGNOSIS — Z95811 Presence of heart assist device: Secondary | ICD-10-CM

## 2021-04-30 DIAGNOSIS — Z7901 Long term (current) use of anticoagulants: Secondary | ICD-10-CM

## 2021-05-01 ENCOUNTER — Other Ambulatory Visit (HOSPITAL_COMMUNITY): Payer: Self-pay | Admitting: Unknown Physician Specialty

## 2021-05-01 DIAGNOSIS — I5022 Chronic systolic (congestive) heart failure: Secondary | ICD-10-CM

## 2021-05-01 DIAGNOSIS — T827XXA Infection and inflammatory reaction due to other cardiac and vascular devices, implants and grafts, initial encounter: Secondary | ICD-10-CM

## 2021-05-01 DIAGNOSIS — I10 Essential (primary) hypertension: Secondary | ICD-10-CM

## 2021-05-01 DIAGNOSIS — R52 Pain, unspecified: Secondary | ICD-10-CM

## 2021-05-01 DIAGNOSIS — Z95811 Presence of heart assist device: Secondary | ICD-10-CM

## 2021-05-01 DIAGNOSIS — G629 Polyneuropathy, unspecified: Secondary | ICD-10-CM

## 2021-05-01 DIAGNOSIS — I420 Dilated cardiomyopathy: Secondary | ICD-10-CM

## 2021-05-01 MED ORDER — SACUBITRIL-VALSARTAN 97-103 MG PO TABS: 1.0000 | ORAL_TABLET | Freq: Two times a day (BID) | ORAL | 6 refills | Status: DC

## 2021-05-01 MED ORDER — CEPHALEXIN 500 MG PO CAPS: 500.0000 mg | ORAL_CAPSULE | Freq: Three times a day (TID) | ORAL | 11 refills | Status: DC

## 2021-05-01 MED ORDER — GABAPENTIN 300 MG PO CAPS: 300.0000 mg | ORAL_CAPSULE | Freq: Three times a day (TID) | ORAL | 6 refills | Status: DC

## 2021-05-01 MED ORDER — SPIRONOLACTONE 25 MG PO TABS: 12.5000 mg | ORAL_TABLET | Freq: Every day | ORAL | 6 refills | Status: DC

## 2021-05-01 MED ORDER — WARFARIN SODIUM 5 MG PO TABS: ORAL_TABLET | ORAL | 6 refills | Status: DC

## 2021-05-01 MED ORDER — PANTOPRAZOLE SODIUM 40 MG PO TBEC: 40.0000 mg | DELAYED_RELEASE_TABLET | Freq: Two times a day (BID) | ORAL | 3 refills | Status: DC

## 2021-05-06 ENCOUNTER — Other Ambulatory Visit: Payer: Self-pay

## 2021-05-06 ENCOUNTER — Ambulatory Visit (HOSPITAL_COMMUNITY)
Admission: RE | Admit: 2021-05-06 | Discharge: 2021-05-06 | Disposition: A | Discharge status: Home / Self Care | Payer: Medicare Other | Source: Ambulatory Visit | Attending: Internal Medicine | Admitting: Internal Medicine

## 2021-05-06 ENCOUNTER — Ambulatory Visit (HOSPITAL_COMMUNITY): Payer: Self-pay | Admitting: Pharmacist

## 2021-05-06 DIAGNOSIS — Z4801 Encounter for change or removal of surgical wound dressing: Secondary | ICD-10-CM | POA: Diagnosis not present

## 2021-05-06 DIAGNOSIS — Z4509 Encounter for adjustment and management of other cardiac device: Secondary | ICD-10-CM | POA: Insufficient documentation

## 2021-05-06 DIAGNOSIS — Z5181 Encounter for therapeutic drug level monitoring: Secondary | ICD-10-CM | POA: Diagnosis not present

## 2021-05-06 DIAGNOSIS — Z7901 Long term (current) use of anticoagulants: Secondary | ICD-10-CM | POA: Diagnosis not present

## 2021-05-06 DIAGNOSIS — Z95811 Presence of heart assist device: Secondary | ICD-10-CM | POA: Insufficient documentation

## 2021-05-06 DIAGNOSIS — Z4800 Encounter for change or removal of nonsurgical wound dressing: Secondary | ICD-10-CM | POA: Diagnosis present

## 2021-05-06 LAB — PROTIME-INR
INR: 2.2 — ABNORMAL HIGH (ref 0.8–1.2)
Prothrombin Time: 24.4 seconds — ABNORMAL HIGH (ref 11.4–15.2)

## 2021-05-06 NOTE — Progress Notes (Signed)
LVAD INR 

## 2021-05-06 NOTE — Progress Notes (Signed)
Patient presents for dressing change and INR in VAD clinic today with his mom.    Exit Site Care: Drive line is being maintained by VAD coordinators. Existing VAD dressing removed and site care performed using sterile technique. Drive line exit site cleaned with CHG swabs x 2 and rinsed with saline, allowed to dry, and Sorbaview dressing with silverlon patch applied. Anchor reapplied and covered with two large tegaderms.  Drive line exit site incorporated, the velour is fully implanted at exit site. No drainage, erythema, tenderness, or foul odor noted.  Pt denies fever or chills. Continue weekly dressing changes. VAD coordinators to perform weekly dressing changes.   Plan: Return to clinic next in one week for dressing change Pharm-D will call you with Coumadin instructions and INR dosing.   Carlton Adam RN VAD Coordinator  Office: [phone]  24/7 Pager: [phone]

## 2021-05-07 ENCOUNTER — Other Ambulatory Visit (HOSPITAL_COMMUNITY): Payer: Self-pay | Admitting: Unknown Physician Specialty

## 2021-05-07 DIAGNOSIS — Z7901 Long term (current) use of anticoagulants: Secondary | ICD-10-CM

## 2021-05-07 DIAGNOSIS — Z95811 Presence of heart assist device: Secondary | ICD-10-CM

## 2021-05-14 ENCOUNTER — Other Ambulatory Visit (HOSPITAL_COMMUNITY): Payer: Medicare Other

## 2021-05-20 ENCOUNTER — Other Ambulatory Visit (HOSPITAL_COMMUNITY): Payer: Medicare Other

## 2021-05-21 ENCOUNTER — Ambulatory Visit (HOSPITAL_COMMUNITY): Payer: Self-pay | Admitting: Pharmacist

## 2021-05-21 ENCOUNTER — Other Ambulatory Visit: Payer: Self-pay

## 2021-05-21 ENCOUNTER — Ambulatory Visit (HOSPITAL_COMMUNITY)
Admission: RE | Admit: 2021-05-21 | Discharge: 2021-05-21 | Disposition: A | Discharge status: Home / Self Care | Payer: 59 | Source: Ambulatory Visit | Attending: Cardiology | Admitting: Cardiology

## 2021-05-21 DIAGNOSIS — Z4801 Encounter for change or removal of surgical wound dressing: Secondary | ICD-10-CM | POA: Diagnosis not present

## 2021-05-21 DIAGNOSIS — Z7901 Long term (current) use of anticoagulants: Secondary | ICD-10-CM | POA: Diagnosis not present

## 2021-05-21 DIAGNOSIS — Z95811 Presence of heart assist device: Secondary | ICD-10-CM | POA: Diagnosis not present

## 2021-05-21 LAB — PROTIME-INR
INR: 2.1 — ABNORMAL HIGH (ref 0.8–1.2)
Prothrombin Time: 23.8 seconds — ABNORMAL HIGH (ref 11.4–15.2)

## 2021-05-21 NOTE — Progress Notes (Signed)
Patient presents for dressing change and INR in VAD clinic today alone.    Exit Site Care: Drive line is being maintained by VAD coordinators. Existing VAD dressing removed and site care performed using sterile technique. Drive line exit site cleaned with CHG swabs x 2 and rinsed with saline, allowed to dry, and Sorbaview dressing with silverlon patch applied. Anchor reapplied and covered with two large tegaderms.  Drive line exit site incorporated, the velour is fully implanted at exit site. No drainage, erythema, tenderness, or foul odor noted.  Pt denies fever or chills. Continue weekly dressing changes. VAD coordinators to perform weekly dressing changes.   Plan: Return to clinic next in one week for dressing change/INR Pharm-D will call you with Coumadin instructions and INR dosing.   Carlton Adam RN VAD Coordinator  Office: [phone]  24/7 Pager: [phone]

## 2021-05-21 NOTE — Progress Notes (Signed)
LVAD INR 

## 2021-05-22 ENCOUNTER — Other Ambulatory Visit (HOSPITAL_COMMUNITY): Payer: Self-pay | Admitting: *Deleted

## 2021-05-22 DIAGNOSIS — Z95811 Presence of heart assist device: Secondary | ICD-10-CM

## 2021-05-22 DIAGNOSIS — Z7901 Long term (current) use of anticoagulants: Secondary | ICD-10-CM

## 2021-05-29 ENCOUNTER — Other Ambulatory Visit (HOSPITAL_COMMUNITY): Payer: 59

## 2021-06-04 ENCOUNTER — Ambulatory Visit (HOSPITAL_COMMUNITY)
Admission: RE | Admit: 2021-06-04 | Discharge: 2021-06-04 | Disposition: A | Discharge status: Home / Self Care | Payer: 59 | Source: Ambulatory Visit | Attending: Internal Medicine | Admitting: Internal Medicine

## 2021-06-04 ENCOUNTER — Other Ambulatory Visit: Payer: Self-pay

## 2021-06-04 DIAGNOSIS — Z4801 Encounter for change or removal of surgical wound dressing: Secondary | ICD-10-CM | POA: Diagnosis present

## 2021-06-04 DIAGNOSIS — Z7901 Long term (current) use of anticoagulants: Secondary | ICD-10-CM | POA: Insufficient documentation

## 2021-06-04 DIAGNOSIS — Z95811 Presence of heart assist device: Secondary | ICD-10-CM | POA: Insufficient documentation

## 2021-06-04 NOTE — Progress Notes (Signed)
Patient presents for dressing change and INR in VAD clinic today with his mom.    Exit Site Care: Drive line is being maintained by VAD coordinators. Existing VAD dressing removed and site care performed using sterile technique. Drive line exit site cleaned with CHG swabs x 2 and rinsed with saline, allowed to dry, and Sorbaview dressing with silverlon patch applied. Anchor reapplied and covered with two large tegaderms.  Drive line exit site incorporated, the velour is fully implanted at exit site. No drainage, erythema, tenderness, or foul odor noted.  Pt denies fever or chills. Continue weekly dressing changes. VAD coordinators to perform weekly dressing changes.   Plan: Return to clinic next in one week for dressing change   Carlton Adam RN VAD Coordinator  Office: [phone]  24/7 Pager: [phone]

## 2021-06-11 ENCOUNTER — Encounter (HOSPITAL_COMMUNITY): Payer: Self-pay

## 2021-06-11 ENCOUNTER — Ambulatory Visit (HOSPITAL_COMMUNITY)
Admission: RE | Admit: 2021-06-11 | Discharge: 2021-06-11 | Disposition: A | Discharge status: Home / Self Care | Payer: 59 | Source: Ambulatory Visit | Attending: Internal Medicine | Admitting: Internal Medicine

## 2021-06-11 ENCOUNTER — Other Ambulatory Visit (HOSPITAL_COMMUNITY): Payer: Self-pay | Admitting: *Deleted

## 2021-06-11 ENCOUNTER — Ambulatory Visit (HOSPITAL_COMMUNITY): Payer: Self-pay | Admitting: Pharmacist

## 2021-06-11 ENCOUNTER — Telehealth (HOSPITAL_COMMUNITY): Payer: Self-pay | Admitting: *Deleted

## 2021-06-11 ENCOUNTER — Other Ambulatory Visit: Payer: Self-pay

## 2021-06-11 DIAGNOSIS — Z7901 Long term (current) use of anticoagulants: Secondary | ICD-10-CM

## 2021-06-11 DIAGNOSIS — Z95811 Presence of heart assist device: Secondary | ICD-10-CM | POA: Diagnosis present

## 2021-06-11 DIAGNOSIS — Z4801 Encounter for change or removal of surgical wound dressing: Secondary | ICD-10-CM | POA: Insufficient documentation

## 2021-06-11 LAB — BASIC METABOLIC PANEL
Anion gap: 6 (ref 5–15)
BUN: 9 mg/dL (ref 6–20)
CO2: 27 mmol/L (ref 22–32)
Calcium: 8.7 mg/dL — ABNORMAL LOW (ref 8.9–10.3)
Chloride: 106 mmol/L (ref 98–111)
Creatinine, Ser: 0.99 mg/dL (ref 0.61–1.24)
GFR, Estimated: >60 mL/min (ref >60)
Glucose, Bld: 102 mg/dL — ABNORMAL HIGH (ref 70–99)
Potassium: 3.9 mmol/L (ref 3.5–5.1)
Sodium: 139 mmol/L (ref 135–145)

## 2021-06-11 LAB — CBC
HCT: 39 % (ref 39.0–52.0)
Hemoglobin: 12.7 g/dL — ABNORMAL LOW (ref 13.0–17.0)
MCH: 29.8 pg (ref 26.0–34.0)
MCHC: 32.6 g/dL (ref 30.0–36.0)
MCV: 91.5 fL (ref 80.0–100.0)
Platelets: 190 10*3/uL (ref 150–400)
RBC: 4.26 MIL/uL (ref 4.22–5.81)
RDW: 13.8 % (ref 11.5–15.5)
WBC: 5.8 10*3/uL (ref 4.0–10.5)
nRBC: 0 % (ref 0.0–0.2)

## 2021-06-11 LAB — PROTIME-INR
INR: 1.7 — ABNORMAL HIGH (ref 0.8–1.2)
Prothrombin Time: 19.5 seconds — ABNORMAL HIGH (ref 11.4–15.2)

## 2021-06-11 LAB — LACTATE DEHYDROGENASE: LDH: 185 U/L (ref 98–192)

## 2021-06-11 NOTE — Progress Notes (Signed)
LVAD INR 

## 2021-06-11 NOTE — Telephone Encounter (Signed)
Called and left voicemail regarding lab results today. Hgb stable at 12.7. Recommended he pick up hemorrhoid cream. Instructed to call VAD coordinators if black, tarry, stool is seen. Requested call back if he had any questions.   Alyce Pagan RN VAD Coordinator  Office: [phone]  24/7 Pager: [phone]

## 2021-06-11 NOTE — Progress Notes (Signed)
Patient presents for dressing change and INR in VAD clinic today alone.   Reports blood when wiping and toilet water is stained red x 3 days. Had normal bowel movement this morning with no blood seen. He does have hemorrhoids. Denies constipation, or straining during bowel movement. Denies dizziness, lightheadedness, shortness of breath, and black tarry stools. Will check CBC, BMET, INR, and LDH today. Dr [name] aware of the above.   Vital Signs: Automatc BP: 116/87 (97) HR: 87 SPO2: UTO   VAD interrogation: Speed: 6500 Flow: 6.1 Power: 5.9 w    PI: 2.7 Hct: 44  Alarms: none Events: 46 - 60+ daily  Exit Site Care: Drive line is being maintained by VAD coordinators. Existing VAD dressing removed and site care performed using sterile technique. Drive line exit site cleaned with CHG swabs x 2 and rinsed with saline, allowed to dry, and Sorbaview dressing with silverlon patch applied. Anchor reapplied and covered with two large tegaderms.  Drive line exit site incorporated, the velour is fully implanted at exit site. No drainage, erythema, tenderness, or foul odor noted.  Pt denies fever or chills. Continue weekly dressing changes. VAD coordinators to perform weekly dressing changes.   Plan: Return to clinic next in one week for dressing change/INR Pharm-D will call you with Coumadin instructions and INR dosing.  Alyce Pagan RN VAD Coordinator  Office: [phone]  24/7 Pager: [phone]

## 2021-06-12 ENCOUNTER — Other Ambulatory Visit (HOSPITAL_COMMUNITY): Payer: Self-pay | Admitting: *Deleted

## 2021-06-12 DIAGNOSIS — Z95811 Presence of heart assist device: Secondary | ICD-10-CM

## 2021-06-12 DIAGNOSIS — Z7901 Long term (current) use of anticoagulants: Secondary | ICD-10-CM

## 2021-06-18 ENCOUNTER — Other Ambulatory Visit (HOSPITAL_COMMUNITY): Payer: 59

## 2021-06-22 ENCOUNTER — Ambulatory Visit (HOSPITAL_COMMUNITY): Payer: Self-pay | Admitting: Pharmacist

## 2021-06-22 ENCOUNTER — Other Ambulatory Visit: Payer: Self-pay

## 2021-06-22 ENCOUNTER — Ambulatory Visit (HOSPITAL_COMMUNITY)
Admission: RE | Admit: 2021-06-22 | Discharge: 2021-06-22 | Disposition: A | Discharge status: Home / Self Care | Payer: 59 | Source: Ambulatory Visit | Attending: Internal Medicine | Admitting: Internal Medicine

## 2021-06-22 DIAGNOSIS — Z4801 Encounter for change or removal of surgical wound dressing: Secondary | ICD-10-CM | POA: Diagnosis present

## 2021-06-22 DIAGNOSIS — Z7901 Long term (current) use of anticoagulants: Secondary | ICD-10-CM | POA: Diagnosis not present

## 2021-06-22 DIAGNOSIS — Z95811 Presence of heart assist device: Secondary | ICD-10-CM | POA: Diagnosis not present

## 2021-06-22 LAB — PROTIME-INR
INR: 2.5 — ABNORMAL HIGH (ref 0.8–1.2)
Prothrombin Time: 27 seconds — ABNORMAL HIGH (ref 11.4–15.2)

## 2021-06-22 NOTE — Progress Notes (Addendum)
Patient presents for dressing change and INR in VAD clinic today alone.   Exit Site Care: Drive line is being maintained by VAD coordinators. Existing VAD dressing removed and site care performed using sterile technique. Drive line exit site cleaned with CHG swabs x 2 and rinsed with saline, allowed to dry, and Sorbaview dressing with silverlon patch applied. Anchor reapplied and covered with two large tegaderms.  Drive line exit site incorporated, the velour is fully implanted at exit site. No drainage, erythema, tenderness, or foul odor noted.  Pt denies fever or chills. Continue weekly dressing changes. VAD coordinators to perform weekly dressing changes. Provided patient with 4 weekly dressing kits and ask him to bring one to each clinic visit. DL damage noted; repaired with rescue tape. Asked patient to keep DL from bending at such a sharp angle. He currently wears controller in shorts pocket which creates acute angle. Pt verbalized understanding and agreement to change.    Plan: Return to clinic next Wednesday at 11:00 for 2 mo f/u visit. Pharm-D will call you with Coumadin instructions and INR dosing.  Hessie Diener RN VAD Coordinator  Office: [phone]  24/7 Pager: [phone]

## 2021-06-22 NOTE — Progress Notes (Signed)
LVAD INR 

## 2021-06-29 ENCOUNTER — Telehealth (HOSPITAL_COMMUNITY): Payer: Self-pay | Admitting: *Deleted

## 2021-06-29 NOTE — Telephone Encounter (Signed)
Received page from patient yesterday stating he has a cold (congestion with mild cough) and wanted to know which medications he was allowed to take. Advised to refer to cough and cold medicine guidelines in his VAD education binder. Also reviewed examples of approved cold medications: Mucinex plain, Delsym plain, Robitussin plain or DM. Pt verbalized understanding. Advised to notify VAD coordinator if symptoms worsen. He verbalized understanding.   Alyce Pagan RN VAD Coordinator  Office: [phone]  24/7 Pager: [phone]

## 2021-06-30 ENCOUNTER — Other Ambulatory Visit (HOSPITAL_COMMUNITY): Payer: Self-pay | Admitting: *Deleted

## 2021-06-30 DIAGNOSIS — Z7901 Long term (current) use of anticoagulants: Secondary | ICD-10-CM

## 2021-06-30 DIAGNOSIS — Z95811 Presence of heart assist device: Secondary | ICD-10-CM

## 2021-07-01 ENCOUNTER — Encounter (HOSPITAL_COMMUNITY): Payer: Medicare Other

## 2021-07-01 ENCOUNTER — Other Ambulatory Visit (HOSPITAL_COMMUNITY): Payer: Self-pay | Admitting: Unknown Physician Specialty

## 2021-07-01 DIAGNOSIS — Z7901 Long term (current) use of anticoagulants: Secondary | ICD-10-CM

## 2021-07-01 DIAGNOSIS — Z95811 Presence of heart assist device: Secondary | ICD-10-CM

## 2021-07-01 MED ORDER — SILDENAFIL CITRATE 20 MG PO TABS: 20.0000 mg | ORAL_TABLET | Freq: Three times a day (TID) | ORAL | 11 refills | Status: DC

## 2021-07-06 ENCOUNTER — Telehealth (HOSPITAL_COMMUNITY): Payer: Self-pay | Admitting: Pharmacist

## 2021-07-06 NOTE — Telephone Encounter (Signed)
Patient Advocate Encounter   Received notification from [employer] that prior authorization for sildenafil is required.   PA submitted on CoverMyMeds Key [id number] Status is pending   Will continue to follow.   Audry Riles, PharmD, BCPS, BCCP, CPP Heart Failure Clinic Pharmacist [phone]

## 2021-07-06 NOTE — Telephone Encounter (Signed)
Advanced Heart Failure Patient Advocate Encounter  Prior Authorization for sildenafil has been approved.    PA# [id number] Effective dates: 07/06/21 through 05/16/22  [name], PharmD, BCPS, BCCP, CPP Heart Failure Clinic Pharmacist [phone]

## 2021-07-07 ENCOUNTER — Ambulatory Visit (HOSPITAL_COMMUNITY)
Admission: RE | Admit: 2021-07-07 | Discharge: 2021-07-07 | Disposition: A | Discharge status: Home / Self Care | Payer: 59 | Source: Ambulatory Visit | Attending: Internal Medicine | Admitting: Internal Medicine

## 2021-07-07 ENCOUNTER — Other Ambulatory Visit: Payer: Self-pay

## 2021-07-07 ENCOUNTER — Ambulatory Visit (HOSPITAL_COMMUNITY): Payer: Self-pay | Admitting: Pharmacist

## 2021-07-07 DIAGNOSIS — Z7901 Long term (current) use of anticoagulants: Secondary | ICD-10-CM | POA: Insufficient documentation

## 2021-07-07 DIAGNOSIS — Z4801 Encounter for change or removal of surgical wound dressing: Secondary | ICD-10-CM | POA: Insufficient documentation

## 2021-07-07 DIAGNOSIS — Z95811 Presence of heart assist device: Secondary | ICD-10-CM | POA: Diagnosis not present

## 2021-07-07 LAB — PROTIME-INR
INR: 1.3 — ABNORMAL HIGH (ref 0.8–1.2)
Prothrombin Time: 16 seconds — ABNORMAL HIGH (ref 11.4–15.2)

## 2021-07-07 MED ORDER — ENOXAPARIN SODIUM 40 MG/0.4ML IJ SOSY: 40.0000 mg | PREFILLED_SYRINGE | Freq: Two times a day (BID) | INTRAMUSCULAR | 0 refills | Status: DC

## 2021-07-07 NOTE — Progress Notes (Signed)
Patient presents for dressing change and INR in VAD clinic today alone.   Exit Site Care: Drive line is being maintained by VAD coordinators. Existing VAD dressing removed and site care performed using sterile technique. Drive line exit site cleaned with CHG swabs x 2 and rinsed with saline, allowed to dry, and Sorbaview dressing with silverlon patch applied. Anchor reapplied and covered with two large tegaderms.  Drive line exit site incorporated, the velour is fully implanted at exit site. No drainage, erythema, tenderness, or foul odor noted.  Pt denies fever or chills. Continue weekly dressing changes. VAD coordinators to perform weekly dressing changes.   Plan: Return to clinic next Wednesday at 10:00 for 2 mo f/u visit. Pharm-D will call you with Coumadin instructions and INR dosing.  Carlton Adam RN VAD Coordinator  Office: [phone]  24/7 Pager: [phone]

## 2021-07-07 NOTE — Progress Notes (Signed)
LVAD INR 

## 2021-07-07 NOTE — Addendum Note (Signed)
Encounter addended by: Lebron Quam, RN on: 07/07/2021 3:08 PM  Actions taken: Clinical Note Signed

## 2021-07-14 ENCOUNTER — Other Ambulatory Visit (HOSPITAL_COMMUNITY): Payer: Self-pay | Admitting: Unknown Physician Specialty

## 2021-07-14 DIAGNOSIS — Z95811 Presence of heart assist device: Secondary | ICD-10-CM

## 2021-07-14 DIAGNOSIS — Z7901 Long term (current) use of anticoagulants: Secondary | ICD-10-CM

## 2021-07-15 ENCOUNTER — Ambulatory Visit (HOSPITAL_COMMUNITY): Payer: Self-pay | Admitting: Pharmacist

## 2021-07-15 ENCOUNTER — Other Ambulatory Visit: Payer: Self-pay

## 2021-07-15 ENCOUNTER — Ambulatory Visit (HOSPITAL_COMMUNITY)
Admission: RE | Admit: 2021-07-15 | Discharge: 2021-07-15 | Disposition: A | Discharge status: Home / Self Care | Payer: 59 | Source: Ambulatory Visit | Attending: Internal Medicine | Admitting: Internal Medicine

## 2021-07-15 VITALS — BP 112/0 | HR 84 | Ht 74.0 in | Wt 208.6 lb

## 2021-07-15 DIAGNOSIS — Z9889 Other specified postprocedural states: Secondary | ICD-10-CM | POA: Diagnosis not present

## 2021-07-15 DIAGNOSIS — I428 Other cardiomyopathies: Secondary | ICD-10-CM | POA: Diagnosis not present

## 2021-07-15 DIAGNOSIS — G4733 Obstructive sleep apnea (adult) (pediatric): Secondary | ICD-10-CM | POA: Insufficient documentation

## 2021-07-15 DIAGNOSIS — Z8711 Personal history of peptic ulcer disease: Secondary | ICD-10-CM | POA: Diagnosis not present

## 2021-07-15 DIAGNOSIS — F17200 Nicotine dependence, unspecified, uncomplicated: Secondary | ICD-10-CM | POA: Diagnosis not present

## 2021-07-15 DIAGNOSIS — Z95811 Presence of heart assist device: Secondary | ICD-10-CM | POA: Diagnosis not present

## 2021-07-15 DIAGNOSIS — I110 Hypertensive heart disease with heart failure: Secondary | ICD-10-CM | POA: Diagnosis not present

## 2021-07-15 DIAGNOSIS — Z09 Encounter for follow-up examination after completed treatment for conditions other than malignant neoplasm: Secondary | ICD-10-CM | POA: Diagnosis not present

## 2021-07-15 DIAGNOSIS — Z720 Tobacco use: Secondary | ICD-10-CM

## 2021-07-15 DIAGNOSIS — Z79899 Other long term (current) drug therapy: Secondary | ICD-10-CM | POA: Diagnosis not present

## 2021-07-15 DIAGNOSIS — I5022 Chronic systolic (congestive) heart failure: Secondary | ICD-10-CM

## 2021-07-15 DIAGNOSIS — I10 Essential (primary) hypertension: Secondary | ICD-10-CM | POA: Diagnosis not present

## 2021-07-15 DIAGNOSIS — Z7901 Long term (current) use of anticoagulants: Secondary | ICD-10-CM

## 2021-07-15 LAB — BASIC METABOLIC PANEL
Anion gap: 7 (ref 5–15)
BUN: 6 mg/dL (ref 6–20)
CO2: 24 mmol/L (ref 22–32)
Calcium: 8.4 mg/dL — ABNORMAL LOW (ref 8.9–10.3)
Chloride: 108 mmol/L (ref 98–111)
Creatinine, Ser: 0.96 mg/dL (ref 0.61–1.24)
GFR, Estimated: >60 mL/min (ref >60)
Glucose, Bld: 105 mg/dL — ABNORMAL HIGH (ref 70–99)
Potassium: 4 mmol/L (ref 3.5–5.1)
Sodium: 139 mmol/L (ref 135–145)

## 2021-07-15 LAB — CBC
HCT: 34.3 % — ABNORMAL LOW (ref 39.0–52.0)
Hemoglobin: 11.2 g/dL — ABNORMAL LOW (ref 13.0–17.0)
MCH: 29.2 pg (ref 26.0–34.0)
MCHC: 32.7 g/dL (ref 30.0–36.0)
MCV: 89.6 fL (ref 80.0–100.0)
Platelets: 188 10*3/uL (ref 150–400)
RBC: 3.83 MIL/uL — ABNORMAL LOW (ref 4.22–5.81)
RDW: 13.1 % (ref 11.5–15.5)
WBC: 5.1 10*3/uL (ref 4.0–10.5)
nRBC: 0 % (ref 0.0–0.2)

## 2021-07-15 LAB — PROTIME-INR
INR: 1.9 — ABNORMAL HIGH (ref 0.8–1.2)
Prothrombin Time: 21.4 seconds — ABNORMAL HIGH (ref 11.4–15.2)

## 2021-07-15 LAB — LACTATE DEHYDROGENASE: LDH: 167 U/L (ref 98–192)

## 2021-07-15 MED ORDER — AMLODIPINE BESYLATE 5 MG PO TABS: 5.0000 mg | ORAL_TABLET | Freq: Every day | ORAL | 3 refills | Status: DC

## 2021-07-15 NOTE — Progress Notes (Signed)
LVAD INR 

## 2021-07-15 NOTE — Progress Notes (Signed)
Patient presents for 2 mo f/u in VAD clinic today alone. Denies any issues with VAD or equipment.  ? ?Pt states he has been feeling great. Denies lightheadedness, falls, shortness of breath, or signs of bleeding. He is drinking 2 L daily. Denies ETOH use. Smoking 4 cigarettes daily. He is eating healthier, and exercising daily.  ? ?He is still working for the [employer]. He is working Monday through Friday 3 pm -7 pm. 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 He loves it.  ? ?BP slightly elevated today. Pt has taken all meds this morning. Will add Norvasc 5 mg daily per Dr. [name] ? ?Pt had slight drop in his hgb today. Pt notes that his hemorrhoids have been bleeding quite a bit recently, using cream. Pt does not want to have any intervention for the hemorrhoids. ? ?Vital Signs: ?Doppler Pressure: 112 ?Automatc BP: 139/92 (110) ?HR: 84 ?SPO2: UTO ?  ?Weight: 208.6 lbs w/ eqt ?Last wt: 201.8 lbs w/ equip  ? ?VAD Indication: ?Destination Therapy  ?  ?VAD interrogation & Equipment Management: ?Speed: 6500 ?Flow: 6.0 ?Power: 5.9 w    ?PI: 2.9 ?Hct: 41 ? ?Alarms: none ?Events: 10 - 20 daily ? ?Primary Controller:  Replace back up battery in 18 months ?Back up controller: did not bring ? ?Annual Equipment Maintenance on UBC/PM was performed 09/03/20  ? ?I reviewed the LVAD parameters from today and compared the results to the patient's prior recorded data.  LVAD interrogation was NEGATIVE for significant power changes, NEGATIVE for clinical alarms and STABLE for PI events/speed drops. No programming changes were made and pump is functioning within specified parameters.  ? ?Exit Site Care: ?Drive line is being maintained weekly by VAD coordinators. Existing VAD dressing removed and site care performed using sterile technique. Drive line exit site cleaned with Chlora prep applicators x 2, RINSED WITH SALINE allowed to dry, and Sorbaview dressing with Silverlon patch reapplied. Anchor reapplied. Covered with a  large tegaderms. Drive line exit site incorporated, the velour is fully implanted at exit site. No drainage, erythema, tenderness, or foul odor noted.  Pt denies fever or chills. Continue weekly dressing changes. VAD coordinators to perform weekly dressing changes. Pt has adequate dressing supplies at home.  ? ?Device: none ? ?BP & Labs:  ?Doppler 112 - reflecting modified systolic ?  ?Hgb 11.2 - No S/S of bleeding. Specifically denies melena/BRBPR or nosebleeds. Pt notes that his hemorrhoids have been bleeding quite a bit recently, using cream. ?  ?LDH 167 - established baseline of 200-300. Denies tea-colored urine. No power elevations noted on interrogation. ? ?Patient Instructions:  ?Start Amlodipine 5 mg daily ?Coumadin dosing per Leotis Shames PharmD ?Return to VAD clinic in 1 week for dressing change ?Return to VAD clinic in 2 months for follow up with Dr [name] ? ?Carlton Adam RN ?VAD Coordinator  ?Office: [phone]  ?24/7 Pager: [phone]  ? ? ? ? ? ? ? ? ? ?

## 2021-07-15 NOTE — Patient Instructions (Signed)
Start Amlodipine 5 mg daily ?Coumadin dosing per Leotis Shames PharmD ?Return to VAD clinic in 1 week for dressing change ?Return to VAD clinic in 2 months for follow up with Dr [name] ?

## 2021-07-17 ENCOUNTER — Other Ambulatory Visit (HOSPITAL_COMMUNITY): Payer: Self-pay | Admitting: *Deleted

## 2021-07-17 DIAGNOSIS — Z7901 Long term (current) use of anticoagulants: Secondary | ICD-10-CM

## 2021-07-17 DIAGNOSIS — Z95811 Presence of heart assist device: Secondary | ICD-10-CM

## 2021-07-17 NOTE — Progress Notes (Addendum)
VAD Clinic Note   Date:  07/17/2021   ID:  Jonathan Wood, DOB 1971-01-15, MRN [id number]  Location: Home  Provider location: [place] [facility] Type of Visit: Established patient  PCP: [name]  Cardiologist:  Arvilla Meres, MD Primary HF: Dr [name]   Chief Complaint: Heart Failure/LVAD  History of Present Illness:  Jonathan Wood is a 51 y.o. 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  Admitted 4/20 with volume overload and anemia. Diuresed with IV lasix and transitioned to torsemide 20 mg daily. GI consulted EGD with gastritis and nonbleeding duodenal ulcers, colonoscopy with 5 polyps removed.  Admitted 12/20 for DL infection with overlying cellulitis and volume overload.   Admitted 4/21 for DL infection with large subxiphoid abscess. Underwent several debridements and eventually a pec muscle flap. Wound cx + MSSA. Initially on ancef and rifabutin but expanded to cefipime then changed to IV ancef and po rifabutin. Now on oral keflex.  Readmitted 6/21 with superficial abscess and tachypnea. Superficial abscess I&D'd and packed. CT scan done and showed possible deeper fluid collection. Reviewed with TCTS who felt it was muscle flap. Sent to IR for possible drainage but u/s confirmed it was the muscle flap and no drainable fluid collection. Wound cx negative. ID saw and recommended continuing Keflex.   Admitted 8/21 with lactic acidosis due to cardiogenic shock. Started on 0.25 of milrinone and NE. Ramp echo on 8/13 with severe RV dysfunction. Speed increased 5600-> 6300. Tolerated ok. Repeat ramp echo on 8/20 speed increased to 6500. Diuresed over 40 pounds.   Admitted 10/21 with  ETOH pancreatitis. Gallbladder ok. Resolved with medical management   Here for VAD f/u. Says he feels great. 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 Having mild  bleeding from hemorrhoids otherwise ok. Still smoking a few cigs per day. Denies orthopnea or PND. No fevers, chills or problems with driveline. No melena or neuro symptoms. No VAD alarms. Taking all meds as prescribed.     VAD Indication: Destination Therapy    VAD interrogation & Equipment Management: Speed: 6500 Flow: 6.0 Power: 5.9 w    PI: 2.9 Hct: 41   Alarms: none Events: 10 - 20 daily   Primary Controller:  Replace back up battery in 18 months Back up controller: did not bring   Annual Equipment Maintenance on UBC/PM was performed 09/03/20   Past Medical History:  Diagnosis Date   Asthma    CHF (congestive heart failure) (HCC)    a. 09/2016: EF 20-25% with cath showing normal cors   GERD (gastroesophageal reflux disease)    History of hiatal hernia    LVAD (left ventricular assist device) present (HCC)    OSA on CPAP 09/06/2018   Severe OSA with AHI 68/hr on CPAP at 12cm H2O   Past Surgical History:  Procedure Laterality Date   APPLICATION OF A-CELL OF CHEST/ABDOMEN N/A 08/24/2019   Procedure: Application Of A-Cell Of Chest/Abdomen;  Surgeon: Kerin Perna, MD;  Location: [facility] OR;  Service: Thoracic;  Laterality: N/A;   APPLICATION OF A-CELL OF CHEST/ABDOMEN N/A 09/05/2019   Procedure: Application Of A-Cell Of Chest/Abdomen;  Surgeon: Peggye Form, DO;  Location: [facility] OR;  Service: Plastics;  Laterality: N/A;   APPLICATION OF A-CELL OF CHEST/ABDOMEN  09/03/2019   Procedure: Application Of A-Cell Of Chest/Abdomen;  Surgeon: Kerin Perna, MD;  Location:  [facility] OR;  Service: Thoracic;;   APPLICATION OF WOUND VAC N/A 08/22/2019   Procedure: Debridement, Irrigation and Packing of Abdominal Incision.;  Surgeon: Kerin Perna, MD;  Location: [facility] OR;  Service: Thoracic;  Laterality: N/A;   APPLICATION OF WOUND VAC N/A 08/24/2019   Procedure: Irrigation and Debridement with WOUND VAC APPLICATION;  Surgeon: Kerin Perna, MD;  Location: [facility] OR;  Service: Thoracic;   Laterality: N/A;   APPLICATION OF WOUND VAC N/A 08/30/2019   Procedure: APPLICATION OF WOUND VAC;  Surgeon: Kerin Perna, MD;  Location: [facility] OR;  Service: Thoracic;  Laterality: N/A;   APPLICATION OF WOUND VAC N/A 09/03/2019   Procedure: WOUND VAC CHANGE;  Surgeon: Kerin Perna, MD;  Location: [facility] OR;  Service: Thoracic;  Laterality: N/A;   BIOPSY  08/23/2018   Procedure: BIOPSY;  Surgeon: Lemar Lofty., MD;  Location: [facility] ENDOSCOPY;  Service: Gastroenterology;;   COLONOSCOPY N/A 08/23/2018   Procedure: COLONOSCOPY;  Surgeon: Lemar Lofty., MD;  Location: [facility] ENDOSCOPY;  Service: Gastroenterology;  Laterality: N/A;   ENTEROSCOPY N/A 08/23/2018   Procedure: ENTEROSCOPY;  Surgeon: Meridee Score Netty Starring., MD;  Location: [facility] ENDOSCOPY;  Service: Gastroenterology;  Laterality: N/A;   HEMOSTASIS CLIP PLACEMENT  08/23/2018   Procedure: HEMOSTASIS CLIP PLACEMENT;  Surgeon: Lemar Lofty., MD;  Location: [facility] ENDOSCOPY;  Service: Gastroenterology;;   IABP INSERTION N/A 09/05/2017   Procedure: IABP INSERTION;  Surgeon: Dolores Patty, MD;  Location: [facility] INVASIVE CV LAB;  Service: Cardiovascular;  Laterality: N/A;   INSERTION OF IMPLANTABLE LEFT VENTRICULAR ASSIST DEVICE N/A 09/06/2017   Procedure: INSERTION OF IMPLANTABLE LEFT VENTRICULAR ASSIST DEVICE - HM3;  Surgeon: Kerin Perna, MD;  Location: [facility] OR;  Service: Open Heart Surgery;  Laterality: N/A;  HM3   MUSCLE FLAP CLOSURE N/A 09/05/2019   Procedure: MUSCLE FLAP CLOSURE;  Surgeon: Peggye Form, DO;  Location: [facility] OR;  Service: Plastics;  Laterality: N/A;   NASAL FRACTURE SURGERY  1987   POLYPECTOMY  08/23/2018   Procedure: POLYPECTOMY;  Surgeon: Mansouraty, Netty Starring., MD;  Location: [facility] ENDOSCOPY;  Service: Gastroenterology;;   RIGHT HEART CATH N/A 08/30/2017   Procedure: RIGHT HEART CATH;  Surgeon: Dolores Patty, MD;  Location: [facility] INVASIVE CV LAB;  Service: Cardiovascular;  Laterality: N/A;   RIGHT/LEFT HEART  CATH AND CORONARY ANGIOGRAPHY N/A 10/08/2016   Procedure: Right/Left Heart Cath and Coronary Angiography;  Surgeon: Orpah Cobb, MD;  Location: [facility] INVASIVE CV LAB;  Service: Cardiovascular;  Laterality: N/A;   STERNAL WOUND DEBRIDEMENT N/A 08/20/2019   Procedure: DEBRIDEMENT OF LVAD DRIVELINE TUNNEL;  Surgeon: Kerin Perna, MD;  Location: [facility] OR;  Service: Thoracic;  Laterality: N/A;   STERNAL WOUND DEBRIDEMENT N/A 09/05/2019   Procedure: DEBRIDEMENT AND CLOSURE OF ABDOMINAL WOUND;  Surgeon: Peggye Form, DO;  Location: [facility] OR;  Service: Plastics;  Laterality: N/A;   SUBMUCOSAL TATTOO INJECTION  08/23/2018   Procedure: SUBMUCOSAL TATTOO INJECTION;  Surgeon: Meridee Score Netty Starring., MD;  Location: [facility] ENDOSCOPY;  Service: Gastroenterology;;   TEE WITHOUT CARDIOVERSION N/A 09/06/2017   Procedure: TRANSESOPHAGEAL ECHOCARDIOGRAM (TEE);  Surgeon: Donata Clay, Theron Arista, MD;  Location: [facility] OR;  Service: Open Heart Surgery;  Laterality: N/A;   WOUND DEBRIDEMENT N/A 08/30/2019   Procedure: Debridement Abdominal Wound;  Surgeon: Kerin Perna, MD;  Location: [facility] OR;  Service: Thoracic;  Laterality: N/A;     Current Outpatient Medications  Medication Sig Dispense Refill   acetaminophen (TYLENOL) 500 MG tablet Take 1,500  mg by mouth 3 (three) times daily as needed for headache (pain).     albuterol (PROAIR HFA) 108 (90 Base) MCG/ACT inhaler Inhale 2 puffs into the lungs every 6 (six) hours as needed for wheezing or shortness of breath. 1 each 6   amLODipine (NORVASC) 5 MG tablet Take 1 tablet (5 mg total) by mouth daily. 180 tablet 3   cephALEXin (KEFLEX) 500 MG capsule Take 1 capsule (500 mg total) by mouth 3 (three) times daily. 90 capsule 11   gabapentin (NEURONTIN) 300 MG capsule Take 1 capsule (300 mg total) by mouth 3 (three) times daily. 90 capsule 6   pantoprazole (PROTONIX) 40 MG tablet Take 1 tablet (40 mg total) by mouth 2 (two) times daily. 180 tablet 3   sacubitril-valsartan (ENTRESTO) 97-103  MG Take 1 tablet by mouth 2 (two) times daily. 60 tablet 6   sildenafil (REVATIO) 20 MG tablet Take 1 tablet (20 mg total) by mouth 3 (three) times daily. 90 tablet 11   spironolactone (ALDACTONE) 25 MG tablet Take 0.5 tablets (12.5 mg total) by mouth daily. (Patient taking differently: Take 25 mg by mouth daily.) 30 tablet 6   warfarin (COUMADIN) 5 MG tablet Take 7.5 mg (1.5 tab) every Monday/Wednesday/Friday and 1 tablet (5 mg) all other days OR as directed by HF clinic 180 tablet 6   enoxaparin (LOVENOX) 40 MG/0.4ML injection Inject 0.4 mLs (40 mg total) into the skin every 12 (twelve) hours. (Patient not taking: Reported on 07/15/2021) 4 mL 0   nicotine (NICODERM CQ - DOSED IN MG/24 HOURS) 21 mg/24hr patch Place 1 patch (21 mg total) onto the skin daily. (Patient not taking: Reported on 04/29/2021) 28 patch 3   torsemide (DEMADEX) 20 MG tablet Take 1 tablet (20 mg total) by mouth every other day. (Patient not taking: Reported on 06/26/2020) 90 tablet 3   No current facility-administered medications for this encounter.    Allergies:   Patient has no known allergies.   Social History:  The patient  reports that he has quit smoking. His smoking use included cigarettes. He has a 12.50 pack-year smoking history. He has never used smokeless tobacco. He reports that he does not currently use alcohol. He reports that he does not currently use drugs after having used the following drugs: Marijuana.   Family History:  The patient's family history includes Alcohol abuse in his cousin; Hypertension in his father.   ROS:  Please see the history of present illness.   All other systems are personally reviewed and negative.   Vital Signs: Doppler Pressure: 112 Automatc BP: 139/92 (110) HR: 84 SPO2: UTO   Weight: 208.6 lbs w/ eqt Last wt: 201.8 lbs w/ equip    Exam: General:  NAD.  HEENT: normal  Neck: supple. JVP not elevated.  Carotids 2+ bilat; no bruits. No lymphadenopathy or thryomegaly  appreciated. Cor: LVAD hum.  Lungs: Clear. Abdomen: obese soft, nontender, non-distended. No hepatosplenomegaly. No bruits or masses. Good bowel sounds. Driveline site clean. Anchor in place.  Extremities: no cyanosis, clubbing, rash. Warm no edema  Neuro: alert & oriented x 3. No focal deficits. Moves all 4 without problem    Recent Labs: 02/18/2021: ALT 17 07/15/2021: BUN 6; Creatinine, Ser 0.96; Hemoglobin 11.2; Platelets 188; Potassium 4.0; Sodium 139    Wt Readings from Last 3 Encounters:  07/15/21 94.6 kg (208 lb 9.6 oz)  04/29/21 91.5 kg (201 lb 12.8 oz)  02/18/21 93.9 kg (207 lb)      ASSESSMENT  AND PLAN:  1. 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 Treated with milrinone and then weaned off.  Diuresed 40 pounds. VAD speed increased 5600 -> 6300 - He continues to do very well. NYHA I. Volume status looks great. Off all diuretics.  - Continue Entresto and spiro.  - We again discussed transplant. He is interested but need to stop smoking and have 6 months of negative cotinine tests. I encouraged him to continue to work toward this.   2. HM3 LVAD 09/06/2017.  - VAD interrogated personally. Parameters stable. - LDH 167 - INR 1.9 goal 2.0-2.5  Discussed dosing with PharmD personally. - Off ASA with PUD.  - hgb 12.1 -> 11.2 MCV ok - DL site look good  3.  Essential HTN - Blood pressure elevated. Add amlodipine 5  4.  ETOH pancreatitis, 11/21 - resolved.  - remains quit from drinking (was stressed about his son) - No recurrence  5. Recurrent DL infection with subxiphoid abscess - s/p debridement and pec flap in 4/21 - remains on keflex. Site continues to look good - He is not a candidate for pump exchange given severe RV dysfunction and poor self-care - No recurrence  6.  OSA - sleep study with very severe OSA (AHI 69/hr) - He has refused therapy multiple times but  continues to lose weight. We have offered repeat testing to see if OSA has improved but he refuses.  - Says he is sleeping much better  7. H/o GI bleed  - 08/2018 EGD showed gastritis and nonbleeding duodenal ulcers, colonoscopy with 5 polyps removed.  - Remains on PPI. No recent GI bleeding - hgb 12.1 -> 11.2 suspect possibly due to hemorrhoids. Will follow - He will stay off aspirin for now.   8. Tobacco Abuse  - continues to smoke a few cigs/day (unchanged) - we again discussed that quitting is required for transplant  Total time spent 35 minutes. Over half that time spent discussing above.    Signed, Arvilla Meres, MD  07/17/2021 7:00 PM  [facility] [address] [facility] [place] [address] [phone] (office) [phone] (fax)

## 2021-07-22 ENCOUNTER — Other Ambulatory Visit (HOSPITAL_COMMUNITY): Payer: 59

## 2021-07-23 ENCOUNTER — Ambulatory Visit (HOSPITAL_COMMUNITY): Payer: Self-pay | Admitting: Pharmacist

## 2021-07-23 ENCOUNTER — Ambulatory Visit (HOSPITAL_COMMUNITY)
Admission: RE | Admit: 2021-07-23 | Discharge: 2021-07-23 | Disposition: A | Discharge status: Home / Self Care | Payer: 59 | Source: Ambulatory Visit | Attending: Cardiology | Admitting: Cardiology

## 2021-07-23 ENCOUNTER — Other Ambulatory Visit (HOSPITAL_COMMUNITY): Payer: 59

## 2021-07-23 ENCOUNTER — Other Ambulatory Visit: Payer: Self-pay

## 2021-07-23 DIAGNOSIS — Z7901 Long term (current) use of anticoagulants: Secondary | ICD-10-CM | POA: Diagnosis not present

## 2021-07-23 DIAGNOSIS — Z95811 Presence of heart assist device: Secondary | ICD-10-CM | POA: Diagnosis not present

## 2021-07-23 LAB — PROTIME-INR
INR: 1.9 — ABNORMAL HIGH (ref 0.8–1.2)
Prothrombin Time: 21.4 seconds — ABNORMAL HIGH (ref 11.4–15.2)

## 2021-07-23 NOTE — Progress Notes (Signed)
Patient presents for dressing change and INR in VAD clinic today alone.  ? ?Exit Site Care: ?Drive line is being maintained by VAD coordinators. Existing VAD dressing removed and site care performed using sterile technique. Drive line exit site cleaned with CHG swabs x 2 and rinsed with saline, allowed to dry, and Sorbaview dressing with silverlon patch applied. Anchor reapplied and covered with two large tegaderms.  Drive line exit site incorporated, the velour is fully implanted at exit site. No drainage, erythema, tenderness, or foul odor noted.  Pt denies fever or chills. Continue weekly dressing changes. VAD coordinators to perform weekly dressing changes.  ? ?Plan: ?Return to clinic in 1 week for dressing change ?Pharm-D will call you with Coumadin instructions and INR dosing. ? ?Carlton Adam RN ?VAD Coordinator  ?Office: [phone]  ?24/7 Pager: [phone]  ? ?

## 2021-07-23 NOTE — Progress Notes (Signed)
LVAD INR 

## 2021-07-30 ENCOUNTER — Other Ambulatory Visit (HOSPITAL_COMMUNITY): Payer: 59

## 2021-07-30 ENCOUNTER — Other Ambulatory Visit (HOSPITAL_COMMUNITY): Payer: Self-pay | Admitting: *Deleted

## 2021-08-04 ENCOUNTER — Other Ambulatory Visit (HOSPITAL_COMMUNITY): Payer: 59

## 2021-08-07 ENCOUNTER — Other Ambulatory Visit (HOSPITAL_COMMUNITY): Payer: 59

## 2021-08-07 ENCOUNTER — Telehealth (HOSPITAL_COMMUNITY): Payer: Self-pay | Admitting: *Deleted

## 2021-08-07 NOTE — Telephone Encounter (Signed)
Pt no showed dressing change appt this morning. I called and spoke with him, and he reports his car "isn't working." States his mother is picking him up to take him to work, but she can not bring him to [place] today as he has to be at work at 1 pm. Made him aware that he is at a higher risk for infection as his drive line dressing has not been changed in 2 weeks. (He canceled his appt last week, and no showed his appt on Tues and today.) He be verbalized understanding stating the dressing "looks ok and is covered with tegaderm." Rescheduled appt for Monday 08/10/21 at 0900- pt assures me he will be at this appt. ? ?Alyce Pagan RN ?VAD Coordinator  ?Office: [phone]  ?24/7 Pager: [phone]  ? ?

## 2021-08-10 ENCOUNTER — Other Ambulatory Visit: Payer: Self-pay

## 2021-08-10 ENCOUNTER — Other Ambulatory Visit (HOSPITAL_COMMUNITY): Payer: Self-pay | Admitting: *Deleted

## 2021-08-10 ENCOUNTER — Ambulatory Visit (HOSPITAL_COMMUNITY)
Admission: RE | Admit: 2021-08-10 | Discharge: 2021-08-10 | Disposition: A | Discharge status: Home / Self Care | Payer: 59 | Source: Ambulatory Visit | Attending: Cardiology | Admitting: Cardiology

## 2021-08-10 ENCOUNTER — Ambulatory Visit (HOSPITAL_COMMUNITY): Payer: Self-pay | Admitting: Pharmacist

## 2021-08-10 DIAGNOSIS — Z4801 Encounter for change or removal of surgical wound dressing: Secondary | ICD-10-CM | POA: Diagnosis not present

## 2021-08-10 DIAGNOSIS — Z7901 Long term (current) use of anticoagulants: Secondary | ICD-10-CM | POA: Insufficient documentation

## 2021-08-10 DIAGNOSIS — Z95811 Presence of heart assist device: Secondary | ICD-10-CM | POA: Insufficient documentation

## 2021-08-10 LAB — PROTIME-INR
INR: 1.6 — ABNORMAL HIGH (ref 0.8–1.2)
Prothrombin Time: 18.9 seconds — ABNORMAL HIGH (ref 11.4–15.2)

## 2021-08-10 NOTE — Progress Notes (Addendum)
Patient presents for dressing change and INR in VAD clinic today alone.  ? ?Exit Site Care: ?Drive line is being maintained by VAD coordinators. Existing VAD dressing removed and site care performed using sterile technique. Drive line exit site cleaned with CHG swabs x 2 and rinsed with saline, allowed to dry, and Sorbaview dressing with silverlon patch applied. Anchor reapplied and covered with two large tegaderms.  Drive line exit site incorporated, the velour is fully implanted at exit site. Scant dried brown drainage at exit site. No erythema, tenderness, or foul odor noted.  Pt denies fever or chills. Continue weekly dressing changes. VAD coordinators to perform weekly dressing changes.  ? ?Plan: ?Return to clinic in 1 week for dressing change ?Pharm-D will call you with Coumadin instructions and INR dosing. ? ?Alyce Pagan RN ?VAD Coordinator  ?Office: [phone]  ?24/7 Pager: [phone]  ? ? ?

## 2021-08-10 NOTE — Progress Notes (Signed)
LVAD INR 

## 2021-08-14 ENCOUNTER — Other Ambulatory Visit (HOSPITAL_COMMUNITY): Payer: Self-pay | Admitting: Unknown Physician Specialty

## 2021-08-14 DIAGNOSIS — Z7901 Long term (current) use of anticoagulants: Secondary | ICD-10-CM

## 2021-08-14 DIAGNOSIS — Z95811 Presence of heart assist device: Secondary | ICD-10-CM

## 2021-08-17 ENCOUNTER — Ambulatory Visit (HOSPITAL_COMMUNITY): Payer: 59 | Attending: Internal Medicine

## 2021-08-19 ENCOUNTER — Ambulatory Visit (HOSPITAL_COMMUNITY)
Admission: RE | Admit: 2021-08-19 | Discharge: 2021-08-19 | Disposition: A | Discharge status: Home / Self Care | Payer: 59 | Source: Ambulatory Visit | Attending: Cardiology | Admitting: Cardiology

## 2021-08-19 ENCOUNTER — Ambulatory Visit (HOSPITAL_COMMUNITY): Payer: Self-pay | Admitting: Pharmacist

## 2021-08-19 DIAGNOSIS — Z95811 Presence of heart assist device: Secondary | ICD-10-CM | POA: Insufficient documentation

## 2021-08-19 DIAGNOSIS — Z4801 Encounter for change or removal of surgical wound dressing: Secondary | ICD-10-CM | POA: Insufficient documentation

## 2021-08-19 DIAGNOSIS — Z452 Encounter for adjustment and management of vascular access device: Secondary | ICD-10-CM | POA: Diagnosis not present

## 2021-08-19 DIAGNOSIS — Z7901 Long term (current) use of anticoagulants: Secondary | ICD-10-CM

## 2021-08-19 LAB — PROTIME-INR
INR: 1.9 — ABNORMAL HIGH (ref 0.8–1.2)
Prothrombin Time: 21.8 seconds — ABNORMAL HIGH (ref 11.4–15.2)

## 2021-08-19 NOTE — Progress Notes (Signed)
LVAD INR 

## 2021-08-19 NOTE — Progress Notes (Signed)
Patient presents for dressing change and INR in VAD clinic today with his daughter Jon Gills.  ? ?Exit Site Care: ?Drive line is being maintained by VAD coordinators. Existing VAD dressing removed and site care performed using sterile technique. Drive line exit site cleaned with CHG swabs x 2 and rinsed with saline, allowed to dry, and Sorbaview dressing with silverlon patch applied. Anchor reapplied and covered with two large tegaderms.  Drive line exit site incorporated, the velour is fully implanted at exit site. Scant dried brown drainage at exit site. No erythema, tenderness, or foul odor noted.  Pt denies fever or chills. Continue weekly dressing changes. VAD coordinators to perform weekly dressing changes. Pt given 4 weekly dressing kits for home use today. ? ?Plan: ?Return to clinic in 1 week for dressing change ?Pharm-D will call you with Coumadin instructions and INR dosing. ? ?Carlton Adam RN ?VAD Coordinator  ?Office: [phone]  ?24/7 Pager: [phone]  ? ? ?

## 2021-08-19 NOTE — Addendum Note (Signed)
Encounter addended by: Christinia Gully, RN on: 08/19/2021 2:21 PM ? Actions taken: Clinical Note Signed

## 2021-08-26 ENCOUNTER — Other Ambulatory Visit (HOSPITAL_COMMUNITY): Payer: 59

## 2021-08-28 ENCOUNTER — Telehealth (HOSPITAL_COMMUNITY): Payer: Self-pay | Admitting: Unknown Physician Specialty

## 2021-08-28 ENCOUNTER — Other Ambulatory Visit (HOSPITAL_COMMUNITY): Payer: Self-pay | Admitting: Unknown Physician Specialty

## 2021-08-28 DIAGNOSIS — Z95811 Presence of heart assist device: Secondary | ICD-10-CM

## 2021-08-28 DIAGNOSIS — Z7901 Long term (current) use of anticoagulants: Secondary | ICD-10-CM

## 2021-08-28 MED ORDER — TRAMADOL HCL 100 MG PO TABS: 100.0000 mg | ORAL_TABLET | Freq: Three times a day (TID) | ORAL | 0 refills | Status: DC | PRN

## 2021-08-28 NOTE — Telephone Encounter (Signed)
Received call from pt stating that he started having "pancreas pain" a few days ago. Pt states that he has taken Tylenol with no relief. Pt c/o nausea, no appetite but has not vomited. Pt states that he has been able to take his medications. Pt is asking for something for pain. D/w Dr [name], will call in Tramadol for the pts pain, instructed pain to hydrate and go the ER if no relief. ? ?[name] RN, BSN ?VAD Coordinator ?24/7 Pager [phone] ? ?

## 2021-08-31 ENCOUNTER — Ambulatory Visit (HOSPITAL_COMMUNITY)
Admission: RE | Admit: 2021-08-31 | Discharge: 2021-08-31 | Disposition: A | Discharge status: Home / Self Care | Payer: 59 | Source: Ambulatory Visit | Attending: Cardiology | Admitting: Cardiology

## 2021-08-31 ENCOUNTER — Ambulatory Visit (HOSPITAL_COMMUNITY): Payer: Self-pay | Admitting: Pharmacist

## 2021-08-31 ENCOUNTER — Encounter (HOSPITAL_COMMUNITY): Payer: Self-pay

## 2021-08-31 ENCOUNTER — Other Ambulatory Visit (HOSPITAL_COMMUNITY): Payer: Self-pay | Admitting: *Deleted

## 2021-08-31 DIAGNOSIS — I5022 Chronic systolic (congestive) heart failure: Secondary | ICD-10-CM

## 2021-08-31 DIAGNOSIS — Z95811 Presence of heart assist device: Secondary | ICD-10-CM

## 2021-08-31 DIAGNOSIS — Z7901 Long term (current) use of anticoagulants: Secondary | ICD-10-CM

## 2021-08-31 DIAGNOSIS — Z720 Tobacco use: Secondary | ICD-10-CM | POA: Diagnosis not present

## 2021-08-31 DIAGNOSIS — R1011 Right upper quadrant pain: Secondary | ICD-10-CM

## 2021-08-31 LAB — CBC
HCT: 34.8 % — ABNORMAL LOW (ref 39.0–52.0)
Hemoglobin: 10.8 g/dL — ABNORMAL LOW (ref 13.0–17.0)
MCH: 26.5 pg (ref 26.0–34.0)
MCHC: 31 g/dL (ref 30.0–36.0)
MCV: 85.5 fL (ref 80.0–100.0)
Platelets: 227 10*3/uL (ref 150–400)
RBC: 4.07 MIL/uL — ABNORMAL LOW (ref 4.22–5.81)
RDW: 14.1 % (ref 11.5–15.5)
WBC: 17.7 10*3/uL — ABNORMAL HIGH (ref 4.0–10.5)
nRBC: 0 % (ref 0.0–0.2)

## 2021-08-31 LAB — AMYLASE: Amylase: 45 U/L (ref 28–100)

## 2021-08-31 LAB — COMPREHENSIVE METABOLIC PANEL
ALT: 35 U/L (ref 0–44)
AST: 48 U/L — ABNORMAL HIGH (ref 15–41)
Albumin: 3.3 g/dL — ABNORMAL LOW (ref 3.5–5.0)
Alkaline Phosphatase: 126 U/L (ref 38–126)
Anion gap: 9 (ref 5–15)
BUN: 27 mg/dL — ABNORMAL HIGH (ref 6–20)
CO2: 23 mmol/L (ref 22–32)
Calcium: 8.9 mg/dL (ref 8.9–10.3)
Chloride: 100 mmol/L (ref 98–111)
Creatinine, Ser: 1.54 mg/dL — ABNORMAL HIGH (ref 0.61–1.24)
GFR, Estimated: 55 mL/min — ABNORMAL LOW (ref >60)
Glucose, Bld: 142 mg/dL — ABNORMAL HIGH (ref 70–99)
Potassium: 3.7 mmol/L (ref 3.5–5.1)
Sodium: 132 mmol/L — ABNORMAL LOW (ref 135–145)
Total Bilirubin: 1.5 mg/dL — ABNORMAL HIGH (ref 0.3–1.2)
Total Protein: 8 g/dL (ref 6.5–8.1)

## 2021-08-31 LAB — PROTIME-INR
INR: 2.6 — ABNORMAL HIGH (ref 0.8–1.2)
Prothrombin Time: 27.8 seconds — ABNORMAL HIGH (ref 11.4–15.2)

## 2021-08-31 LAB — LIPASE, BLOOD: Lipase: 26 U/L (ref 11–51)

## 2021-08-31 NOTE — Progress Notes (Signed)
LVAD INR 

## 2021-08-31 NOTE — Patient Instructions (Signed)
Stop Amlodipine  ?Coumadin dosing per Leotis Shames PharmD ?Return to VAD clinic in 1 week for BP check, INR, and dressing change ?

## 2021-08-31 NOTE — Progress Notes (Addendum)
Patient presents for labs and dressing change in VAD clinic today alone. Denies any issues with VAD or equipment.  ? ?Pt states he has been lightheadedness and dizzy with activity. Reports he is drinking 1.5 liters daily. He was recently started on Amlodipine 5 mg daily. BP low today. See orthostatic vitals as documented below. Discussed with Dr [name]. Will stop Amlodipine today.  ? ?Received call from pt on 08/28/21 stating that he started having "pancreas pain".   Pt c/o nausea, no appetite but had not vomited at that time. D/w Dr [name] and was provided with Tramadol for pain. Today pt reports left side abdominal has improved but is still present, and that his appetite is slowly coming back. Denies fever or chills. Reports occasional mid sternal pain, but this is much improved. Denies dysuria. Amylase and lipase WNL. WBC 17.7 today.  ? ?Vital Signs: ?Automatic BP:  ? Laying: 83/49 (59) HR 93  Speed: 6500 Flow: 6.4 Power: 5.8 w PI: 1.6 ? Sitting: 87/38 (53) HR 94 Speed: 6300 Flow: 5.8 Power: 5.6 w PI: 1.9 ?      Standing: 80/49 (62) HR 100 Speed: 6000 Flow: 7.5 Power: 5.6 w PI: 7.1  ?HR: 93 ?SPO2: UTO ?  ?Weight: 208.6 lbs w/ eqt ?Last wt: 201.8 lbs w/ equip  ? ?VAD Indication: ?Destination Therapy  ?  ?VAD interrogation & Equipment Management: ?Speed: 6500 ?Flow: 6.4 ?Power: 5.8 w    ?PI: 1.6 ?Hct: 41 ? ?Alarms: 4/16 LOW VOLTAGE [place]ard alarm while changing power sources ?Events: 40 PI events so far today; 100+ daily ? ?Primary Controller:  Replace back up battery in 18 months ?Back up controller: did not bring ? ?Annual Equipment Maintenance on UBC/PM was performed 09/03/20  ? ?I reviewed the LVAD parameters from today and compared the results to the patient's prior recorded data.  LVAD interrogation was NEGATIVE for significant power changes, NEGATIVE for clinical alarms and STABLE for PI events/speed drops. No programming changes were made and pump is functioning within specified parameters.  ? ?Exit Site  Care: ?Drive line is being maintained weekly by VAD coordinators. Existing VAD dressing removed and site care performed using sterile technique. Drive line exit site cleaned with Chlora prep applicators x 2, RINSED WITH SALINE allowed to dry, and Sorbaview dressing with Silverlon patch reapplied. Anchor reapplied. Covered with a large tegaderms. Drive line exit site incorporated, the velour is fully implanted at exit site. No drainage, erythema, tenderness, or foul odor noted.  Pt denies fever or chills. Continue weekly dressing changes. VAD coordinators to perform weekly dressing changes. Pt has adequate dressing supplies at home.  ? ?Device: none ? ?BP & Labs:  ?Doppler  - reflecting modified systolic ?  ?Hgb 10.8 - No S/S of bleeding. Specifically denies melena/BRBPR or nosebleeds. Pt notes that his hemorrhoids have been bleeding quite a bit recently, using cream. ?  ?LDH not drawn today - established baseline of 200-300. Denies tea-colored urine. No power elevations noted on interrogation. ? ?WBC: 17.7. Pt denies fever/chills. Denies discomfort with urination. Will repeat CBC with Sed rate on Friday at [facility] per Dr [name].  ? ?Creatinine: 1.54. Discussed need to increase hydration with pt. Will recheck CMET and BP next week.  ? ?Patient Instructions:  ?Stop Amlodipine  ?Coumadin dosing per Leotis Shames PharmD ?Return to VAD clinic in 1 week for BP check, CMET, INR, and dressing change ?Repeat CBC, CMET, and Sed rate Friday at [facility]. (Pt aware) ? ?Alyce Pagan RN ?VAD Coordinator  ?  Office: [phone]  ?24/7 Pager: [phone]  ? ? ? ? ? ? ? ? ? ? ? ?

## 2021-09-03 ENCOUNTER — Other Ambulatory Visit (HOSPITAL_COMMUNITY): Payer: Self-pay | Admitting: *Deleted

## 2021-09-03 ENCOUNTER — Telehealth (HOSPITAL_COMMUNITY): Payer: Self-pay | Admitting: *Deleted

## 2021-09-03 ENCOUNTER — Other Ambulatory Visit (HOSPITAL_COMMUNITY)
Admission: RE | Admit: 2021-09-03 | Discharge: 2021-09-03 | Disposition: A | Discharge status: Home / Self Care | Payer: 59 | Source: Ambulatory Visit | Attending: Internal Medicine | Admitting: Internal Medicine

## 2021-09-03 DIAGNOSIS — Z7901 Long term (current) use of anticoagulants: Secondary | ICD-10-CM

## 2021-09-03 DIAGNOSIS — Z95811 Presence of heart assist device: Secondary | ICD-10-CM

## 2021-09-03 DIAGNOSIS — I5022 Chronic systolic (congestive) heart failure: Secondary | ICD-10-CM

## 2021-09-03 LAB — CBC
HCT: 33.2 % — ABNORMAL LOW (ref 39.0–52.0)
Hemoglobin: 10.4 g/dL — ABNORMAL LOW (ref 13.0–17.0)
MCH: 26.9 pg (ref 26.0–34.0)
MCHC: 31.3 g/dL (ref 30.0–36.0)
MCV: 85.8 fL (ref 80.0–100.0)
Platelets: 316 10*3/uL (ref 150–400)
RBC: 3.87 MIL/uL — ABNORMAL LOW (ref 4.22–5.81)
RDW: 14.6 % (ref 11.5–15.5)
WBC: 9.4 10*3/uL (ref 4.0–10.5)
nRBC: 0 % (ref 0.0–0.2)

## 2021-09-03 LAB — COMPREHENSIVE METABOLIC PANEL
ALT: 72 U/L — ABNORMAL HIGH (ref 0–44)
AST: 51 U/L — ABNORMAL HIGH (ref 15–41)
Albumin: 3.4 g/dL — ABNORMAL LOW (ref 3.5–5.0)
Alkaline Phosphatase: 165 U/L — ABNORMAL HIGH (ref 38–126)
Anion gap: 6 (ref 5–15)
BUN: 19 mg/dL (ref 6–20)
CO2: 25 mmol/L (ref 22–32)
Calcium: 8.6 mg/dL — ABNORMAL LOW (ref 8.9–10.3)
Chloride: 106 mmol/L (ref 98–111)
Creatinine, Ser: 1.11 mg/dL (ref 0.61–1.24)
GFR, Estimated: >60 mL/min (ref >60)
Glucose, Bld: 139 mg/dL — ABNORMAL HIGH (ref 70–99)
Potassium: 3.7 mmol/L (ref 3.5–5.1)
Sodium: 137 mmol/L (ref 135–145)
Total Bilirubin: 0.6 mg/dL (ref 0.3–1.2)
Total Protein: 8 g/dL (ref 6.5–8.1)

## 2021-09-03 LAB — SEDIMENTATION RATE: Sed Rate: 95 mm/hr — ABNORMAL HIGH (ref 0–16)

## 2021-09-03 NOTE — Telephone Encounter (Signed)
Returned call to patient with lab results from [facility]. Pt very concerned and went a day early. Will review with Dr. [name] and call patient if any changes. ? ?WBC and renal function improved. Pt says he feels "great". Says he is having "some" stomach pain but feels "better overall". ? ?Discussed plan for repeat labs 09/09/21 with his dressing change visit here. Pt verbalized understanding of same. ? ?Zada Girt RN, VAD Coordinator ?[phone] ?

## 2021-09-05 ENCOUNTER — Telehealth (HOSPITAL_COMMUNITY): Payer: Self-pay | Admitting: Unknown Physician Specialty

## 2021-09-05 NOTE — Telephone Encounter (Signed)
Received a message from Dr [name] regarding pts elevated sed rate. Pt states that he feels better and "doesn't feel sick anymore." Pt informed that we have moved his appt up with Dr bensimhon to Monday morning and we will draw BC at that time. Pt was instructed that if he starts to feel bad at all to please page the VAD pager. ? ?Carlton Adam RN, BSN ?VAD Coordinator ?24/7 Pager [phone] ? ?

## 2021-09-07 ENCOUNTER — Ambulatory Visit (HOSPITAL_COMMUNITY)
Admission: RE | Admit: 2021-09-07 | Discharge: 2021-09-07 | Disposition: A | Discharge status: Home / Self Care | Payer: 59 | Source: Ambulatory Visit | Attending: Cardiology | Admitting: Cardiology

## 2021-09-07 ENCOUNTER — Ambulatory Visit (HOSPITAL_COMMUNITY): Payer: Self-pay | Admitting: Pharmacist

## 2021-09-07 VITALS — BP 104/83 | HR 86 | Wt 204.4 lb

## 2021-09-07 DIAGNOSIS — I5022 Chronic systolic (congestive) heart failure: Secondary | ICD-10-CM | POA: Insufficient documentation

## 2021-09-07 DIAGNOSIS — Z7901 Long term (current) use of anticoagulants: Secondary | ICD-10-CM | POA: Diagnosis not present

## 2021-09-07 DIAGNOSIS — Z95811 Presence of heart assist device: Secondary | ICD-10-CM | POA: Insufficient documentation

## 2021-09-07 DIAGNOSIS — I10 Essential (primary) hypertension: Secondary | ICD-10-CM

## 2021-09-07 DIAGNOSIS — R1011 Right upper quadrant pain: Secondary | ICD-10-CM

## 2021-09-07 LAB — CBC
HCT: 34.8 % — ABNORMAL LOW (ref 39.0–52.0)
Hemoglobin: 10.5 g/dL — ABNORMAL LOW (ref 13.0–17.0)
MCH: 26.4 pg (ref 26.0–34.0)
MCHC: 30.2 g/dL (ref 30.0–36.0)
MCV: 87.7 fL (ref 80.0–100.0)
Platelets: 347 10*3/uL (ref 150–400)
RBC: 3.97 MIL/uL — ABNORMAL LOW (ref 4.22–5.81)
RDW: 14.6 % (ref 11.5–15.5)
WBC: 7.3 10*3/uL (ref 4.0–10.5)
nRBC: 0 % (ref 0.0–0.2)

## 2021-09-07 LAB — SEDIMENTATION RATE: Sed Rate: 85 mm/hr — ABNORMAL HIGH (ref 0–16)

## 2021-09-07 LAB — COMPREHENSIVE METABOLIC PANEL
ALT: 31 U/L (ref 0–44)
AST: 24 U/L (ref 15–41)
Albumin: 3.3 g/dL — ABNORMAL LOW (ref 3.5–5.0)
Alkaline Phosphatase: 105 U/L (ref 38–126)
Anion gap: 9 (ref 5–15)
BUN: 13 mg/dL (ref 6–20)
CO2: 21 mmol/L — ABNORMAL LOW (ref 22–32)
Calcium: 8.9 mg/dL (ref 8.9–10.3)
Chloride: 106 mmol/L (ref 98–111)
Creatinine, Ser: 1.13 mg/dL (ref 0.61–1.24)
GFR, Estimated: >60 mL/min (ref >60)
Glucose, Bld: 178 mg/dL — ABNORMAL HIGH (ref 70–99)
Potassium: 4 mmol/L (ref 3.5–5.1)
Sodium: 136 mmol/L (ref 135–145)
Total Bilirubin: 0.5 mg/dL (ref 0.3–1.2)
Total Protein: 7.3 g/dL (ref 6.5–8.1)

## 2021-09-07 LAB — PROTIME-INR
INR: 1.6 — ABNORMAL HIGH (ref 0.8–1.2)
Prothrombin Time: 19.3 seconds — ABNORMAL HIGH (ref 11.4–15.2)

## 2021-09-07 LAB — LACTATE DEHYDROGENASE: LDH: 199 U/L — ABNORMAL HIGH (ref 98–192)

## 2021-09-07 NOTE — Progress Notes (Signed)
?  Heart and Vascular Care Navigation ? ?09/07/2021 ? ?Jonathan Wood ?[birth year] ?[id number] ? ?Reason for Referral: unable to afford gas money to make it to appt ?  ?Engaged with patient face to face for initial visit for Heart and Vascular Care Coordination. ?                                                                                                  ?Assessment:    CSW provided with gift card to purchase gas so he could get to and from appt.                                 ? ?HRT/VAS Care Coordination   ? ? Patients Home Cardiology Office Heart Failure Clinic  ? Outpatient Care Team Social Worker  ? Social Worker Name: [name], [place] [phone]  ? Living arrangements for the past 2 months Single Family Home  ? Lives with: Minor Children  28yo daughter and 9yo son  ? Patient Current Insurance Coverage Traditional Medicare; Medicaid  ? Patient Has Concern With Paying Medical Bills No  ? Does Patient Have Prescription Coverage? Yes  ? Home Assistive Devices/Equipment Scales; Eyeglasses  ? DME Agency NA  ? [facility]  ? Current home services Home RN  ? ?  ? ? ?Social History:                                                                             ?SDOH Screenings  ? ?Alcohol Screen: Not on file  ?Depression (PHQ2-9): Not on file  ?Financial Resource Strain: High Risk  ? Difficulty of Paying Living Expenses: Hard  ?Food Insecurity: Not on file  ?Housing: Not on file  ?Physical Activity: Not on file  ?Social Connections: Not on file  ?Stress: Not on file  ?Tobacco Use: Medium Risk  ? Smoking Tobacco Use: Former  ? Smokeless Tobacco Use: Never  ? Passive Exposure: Not on file  ?Transportation Needs: Unmet Transportation Needs  ? Lack of Transportation (Medical): Yes  ? Lack of Transportation (Non-Medical): Yes  ? ? ? ?Follow-up plan:   ? ?No further needs at this time ? ?Jorge Ny, LCSW ?Clinical Social Worker ?[facility] ?Desk#: [phone] ?Cell#:  [phone] ? ? ? ?

## 2021-09-07 NOTE — Progress Notes (Signed)
LVAD INR 

## 2021-09-07 NOTE — Progress Notes (Signed)
Patient presents for sick visit in VAD clinic today alone following increased sed rate last week. Denies any issues with VAD or equipment.  ? ?Pt states he is feeling much better since stopping the Amlodipine. BP is better today. Denies pain. Afebrile. ? ?Labs repeated today along with BC x 2. Pts Sed rate is 85 today. D/w Dr [name]. Will order complete echo. Pt may need autoimmune workup in the future.  ? ?Vital Signs: ?Temp: 98.2 ?Doppler: 108 ?Automatic BP: 104/83 (96) ?HR: 86 ?SPO2: 98 ?  ?Weight: 204.4 lbs w/ eqt ?Last wt: 208.6 lbs w/ equip  ? ?VAD Indication: ?Destination Therapy  ?  ?VAD interrogation & Equipment Management: ?Speed: [id number] ?Flow: 6 ?Power: 6.1 w    ?PI: 2.2 ?Hct: 33 ? ?Alarms: several LV ?Events: 85 daily ? ?Primary Controller:  Replace back up battery in 17 months ?Back up controller: did not bring ? ?Annual Equipment Maintenance on UBC/PM was performed 09/03/20  ? ?I reviewed the LVAD parameters from today and compared the results to the patient's prior recorded data.  LVAD interrogation was NEGATIVE for significant power changes, NEGATIVE for clinical alarms and STABLE for PI events/speed drops. No programming changes were made and pump is functioning within specified parameters.  ? ?Exit Site Care: ?Drive line is being maintained weekly by VAD coordinators. Existing VAD dressing removed and site care performed using sterile technique. Drive line exit site cleaned with Chlora prep applicators x 2, RINSED WITH SALINE allowed to dry, and Sorbaview dressing with Silverlon patch reapplied. Anchor reapplied. Covered with a large tegaderms. Drive line exit site incorporated, the velour is fully implanted at exit site. No drainage, erythema, tenderness, or foul odor noted.  Pt denies fever or chills. Continue weekly dressing changes. VAD coordinators to perform weekly dressing changes. Pt has adequate dressing supplies at home.  ? ?Device: none ? ?BP & Labs:  ?Doppler 108 - reflecting modified  systolic ?  ?Hgb 10.5 - No S/S of bleeding. Specifically denies melena/BRBPR or nosebleeds.  ?  ?LDH 199 - established baseline of 200-300. Denies tea-colored urine. No power elevations noted on interrogation. ? ?WBC: 7.3. - much improved ? ? ?4 year Intermacs follow up completed including:  Quality of Life, KCCQ-12, and Neurocognitive trail making.  ? ?Pt unable to do today, will do at dressing change next week along with annual maintenance. ? ?[facility] Cardiomyopathy Questionnaire  ? ?  09/07/2021  ? 11:49 AM 09/03/2020  ?  2:08 PM 03/12/2020  ?  2:28 PM  ?KCCQ-12  ?1 a. Ability to shower/bathe Not at all limited Moderately limited Slightly limited  ?1 b. Ability to walk 1 block Not at all limited Not at all limited Slightly limited  ?1 c. Ability to hurry/jog Not at all limited Quite a bit limited Moderately limited  ?2. Edema feet/ankles/legs Never over the past 2 weeks Never over the past 2 weeks Never over the past 2 weeks  ?3. Limited by fatigue Less than once a week 1-2 times a week 1-2 times a week  ?4. Limited by dyspnea Never over the past 2 weeks Less than once a week 3+ times a week, not every day  ?5. Sitting up / on 3+ pillows Never over the past 2 weeks Never over the past 2 weeks Never over the past 2 weeks  ?6. Limited enjoyment of life Slightly limited Moderately limited Slightly limited  ?7. Rest of life w/ symptoms Completely satisfied Mostly satisfied Somewhat satisfied  ?8 a. Participation in  hobbies Slightly limited Slightly limited Moderately limited  ?8 b. Participation in chores Slightly limited Moderately limited Moderately limited  ?8 c. Visiting family/friends Did not limit at all N/A, did not do for other reasons Moderately limited  ?  ? ?Patient Instructions:  ?No change in medications ?Return to clinic in 1 week for dressing change, INR, annual main and 6MW. ?Return to clinic in 2 months to see DR Bensimhon ? ?[name] RN ?VAD Coordinator  ?Office: [phone]  ?24/7 Pager:  [phone]  ? ? ? ? ? ? ? ? ? ? ? ?

## 2021-09-07 NOTE — Patient Instructions (Signed)
No change in medications ?Return to clinic in 1 week for dressing change, INR, annual main and . ?Return to clinic in 2 months to see DR Bensimhon ?

## 2021-09-09 ENCOUNTER — Other Ambulatory Visit (HOSPITAL_COMMUNITY): Payer: 59

## 2021-09-11 ENCOUNTER — Other Ambulatory Visit (HOSPITAL_COMMUNITY): Payer: Self-pay | Admitting: *Deleted

## 2021-09-11 DIAGNOSIS — Z7901 Long term (current) use of anticoagulants: Secondary | ICD-10-CM

## 2021-09-11 DIAGNOSIS — Z95811 Presence of heart assist device: Secondary | ICD-10-CM

## 2021-09-12 LAB — CULTURE, BLOOD (ROUTINE X 2): Culture: NO GROWTH

## 2021-09-16 ENCOUNTER — Ambulatory Visit (HOSPITAL_COMMUNITY)
Admission: RE | Admit: 2021-09-16 | Discharge: 2021-09-16 | Disposition: A | Discharge status: Home / Self Care | Payer: 59 | Source: Ambulatory Visit | Attending: Internal Medicine | Admitting: Internal Medicine

## 2021-09-16 ENCOUNTER — Encounter (HOSPITAL_COMMUNITY): Payer: 59

## 2021-09-16 ENCOUNTER — Ambulatory Visit (HOSPITAL_COMMUNITY): Payer: Self-pay | Admitting: Pharmacist

## 2021-09-16 ENCOUNTER — Ambulatory Visit (HOSPITAL_BASED_OUTPATIENT_CLINIC_OR_DEPARTMENT_OTHER)
Admission: RE | Admit: 2021-09-16 | Discharge: 2021-09-16 | Disposition: A | Discharge status: Home / Self Care | Payer: 59 | Source: Ambulatory Visit | Attending: Internal Medicine | Admitting: Internal Medicine

## 2021-09-16 DIAGNOSIS — Z7901 Long term (current) use of anticoagulants: Secondary | ICD-10-CM

## 2021-09-16 DIAGNOSIS — Z95811 Presence of heart assist device: Secondary | ICD-10-CM | POA: Diagnosis not present

## 2021-09-16 DIAGNOSIS — I5022 Chronic systolic (congestive) heart failure: Secondary | ICD-10-CM | POA: Diagnosis not present

## 2021-09-16 DIAGNOSIS — I110 Hypertensive heart disease with heart failure: Secondary | ICD-10-CM | POA: Insufficient documentation

## 2021-09-16 LAB — PROTIME-INR
INR: 1.4 — ABNORMAL HIGH (ref 0.8–1.2)
Prothrombin Time: 17.3 seconds — ABNORMAL HIGH (ref 11.4–15.2)

## 2021-09-16 NOTE — Progress Notes (Signed)
LVAD INR 

## 2021-09-16 NOTE — Progress Notes (Signed)
Patient presents for dressing change and INR in VAD clinic today alone. ? ?Patient brought home equipment for annual maintenance - see below.  ? UBC failed inspection - replaced with loaner UBC [phone] provided ? MPU failed inspection - new MPU: [id number] provided ? Provided patient with new set of battery clips per his request ? ?Patient going for complete echo after completion of visit today in VAD clinic. ? ? ?Exit Site Care: ?Drive line is being maintained by VAD coordinators. Existing VAD dressing removed and site care performed using sterile technique. Drive line exit site cleaned with CHG swabs x 2 and rinsed with saline, allowed to dry, and Sorbaview dressing with silverlon patch applied. Anchor reapplied and covered with two large tegaderms.  Drive line exit site incorporated, the velour is fully implanted at exit site. Scant dried brown drainage at exit site. No erythema, tenderness, or foul odor noted.  Pt denies fever or chills. Continue weekly dressing changes. VAD coordinators to perform weekly dressing changes. Pt given 4 weekly dressing kits for home use today. ? ? ?Batteries Manufacture Date: Number of uses: Re-calibration  ?07/02/20 < 70 uses Performed by patient  ? ?Annual maintenance completed per [employer] on patient?s home power module and Warehouse manager.  Both failed inspection and were replaced.  ? ?Backup system controller: patient did not bring to visit. Advised him to bring to next visit.  ? ?Pt completed 1550 feet during 6 minute walk. ? ? ?Plan: ?Return to clinic in 1 week for dressing change. ?Pharm-D will call you with Coumadin instructions and INR dosing. ? ?Hessie Diener RN ?VAD Coordinator  ?Office: [phone]  ?24/7 Pager: [phone]  ? ? ?

## 2021-09-17 ENCOUNTER — Other Ambulatory Visit (HOSPITAL_COMMUNITY): Payer: Self-pay | Admitting: Pharmacist

## 2021-09-17 MED ORDER — ENOXAPARIN SODIUM 40 MG/0.4ML IJ SOSY
40.0000 mg | PREFILLED_SYRINGE | Freq: Two times a day (BID) | INTRAMUSCULAR | 0 refills | Status: DC
Start: 2021-09-17 — End: 2021-11-11

## 2021-09-20 NOTE — Progress Notes (Addendum)
?  ? ?VAD Clinic Note ? ? ? ?ID:  Jonathan Wood, DOB 10/12/1970, MRN [id number]  Location: Home  ?Provider location: [place] [facility] ?Type of Visit: Established patient ? ?PCP: [facility]  ?Cardiologist:  Arvilla Meres, MD ?Primary HF: Dr [name]  ? ?Chief Complaint: Heart Failure/LVAD  ?History of Present Illness: ? ?Jonathan Wood is a 51 y.o. 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 ? ?Admitted 4/20 with volume overload and anemia. Diuresed with IV lasix and transitioned to torsemide 20 mg daily. GI consulted EGD with gastritis and nonbleeding duodenal ulcers, colonoscopy with 5 polyps removed. ? ?Admitted 12/20 for DL infection with overlying cellulitis and volume overload.  ? ?Admitted 4/21 for DL infection with large subxiphoid abscess. Underwent several debridements and eventually a pec muscle flap. Wound cx + MSSA. Initially on ancef and rifabutin but expanded to cefipime then changed to IV ancef and po rifabutin. Now on oral keflex. ? ?Readmitted 6/21 with superficial abscess and tachypnea. Superficial abscess I&D'd and packed. CT scan done and showed possible deeper fluid collection. Reviewed with TCTS who felt it was muscle flap. Sent to IR for possible drainage but u/s confirmed it was the muscle flap and no drainable fluid collection. Wound cx negative. ID saw and recommended continuing Keflex.  ? ?Admitted 8/21 with lactic acidosis due to cardiogenic shock. Started on 0.25 of milrinone and NE. Ramp echo on 8/13 with severe RV dysfunction. Speed increased 5600-> 6300. Tolerated ok. Repeat ramp echo on 8/20 speed increased to 6500. Diuresed over 40 pounds.  ? ?Admitted 10/21 with  ETOH pancreatitis. Gallbladder ok. Resolved with medical management  ? ?Pt states he has been lightheadedness and dizzy with activity. Reports he is drinking 1.5 liters daily. He was recently  started on Amlodipine 5 mg daily. BP low today. See orthostatic vitals as documented below. Discussed with Dr [name]. Will stop Amlodipine today.  ?  ?Presents today for acute visit due to abdominal pain. Says pain started several days ago and feels like the pain he had when he had pancreatitis. Pain improved over last 3-4 days and now almost completely resolved. No fevers or chills. appetite slowly coming back. No change in stools. Denies orthopnea or PND. No fevers, chills or problems with driveline. No bleeding, melena or neuro symptoms. No VAD alarms. Taking all meds as prescribed.  ? ? ?VAD Indication: ?Destination Therapy  ?  ?VAD interrogation & Equipment Management: ?Speed: 6500 ?Flow: 6.4 ?Power: 5.8 w    ?PI: 1.6 ?Hct: 41 ?  ?Alarms: 4/16 LOW VOLTAGE [place] alarm while changing power sources ?Events: 40 PI events so far today; 100+ daily ?  ?Primary Controller:  Replace back up battery in 18 months ?Back up controller: did not bring ?  ?Annual Equipment Maintenance on UBC/PM was performed 09/03/20  ? ?Past Medical History:  ?Diagnosis Date  ? Asthma   ? CHF (congestive heart failure) (HCC)   ? a. 09/2016: EF 20-25% with cath showing normal cors  ? GERD (gastroesophageal reflux disease)   ? History of hiatal hernia   ? LVAD (left ventricular assist device) present (HCC)   ? OSA on CPAP 09/06/2018  ? Severe OSA with AHI 68/hr on CPAP at 12cm H2O  ? ?Past Surgical History:  ?Procedure Laterality Date  ? APPLICATION OF A-CELL OF CHEST/ABDOMEN N/A 08/24/2019  ? Procedure: Application Of A-Cell Of Chest/Abdomen;  Surgeon: Donata Clay, Theron Arista, MD;  Location: [facility] OR;  Service: Thoracic;  Laterality: N/A;  ? APPLICATION OF A-CELL OF CHEST/ABDOMEN N/A 09/05/2019  ? Procedure: Application Of A-Cell Of Chest/Abdomen;  Surgeon: Peggye Form, DO;  Location: [facility] OR;  Service: Plastics;  Laterality: N/A;  ? APPLICATION OF A-CELL OF CHEST/ABDOMEN  09/03/2019  ? Procedure: Application Of A-Cell Of Chest/Abdomen;  Surgeon:  Kerin Perna, MD;  Location: [facility] OR;  Service: Thoracic;;  ? APPLICATION OF WOUND VAC N/A 08/22/2019  ? Procedure: Debridement, Irrigation and Packing of Abdominal Incision.;  Surgeon: Kerin Perna, MD;  Location: [facility] OR;  Service: Thoracic;  Laterality: N/A;  ? APPLICATION OF WOUND VAC N/A 08/24/2019  ? Procedure: Irrigation and Debridement with WOUND VAC APPLICATION;  Surgeon: Kerin Perna, MD;  Location: [facility] OR;  Service: Thoracic;  Laterality: N/A;  ? APPLICATION OF WOUND VAC N/A 08/30/2019  ? Procedure: APPLICATION OF WOUND VAC;  Surgeon: Donata Clay, Theron Arista, MD;  Location: [facility] OR;  Service: Thoracic;  Laterality: N/A;  ? APPLICATION OF WOUND VAC N/A 09/03/2019  ? Procedure: WOUND VAC CHANGE;  Surgeon: Kerin Perna, MD;  Location: [facility] OR;  Service: Thoracic;  Laterality: N/A;  ? BIOPSY  08/23/2018  ? Procedure: BIOPSY;  Surgeon: Lemar Lofty., MD;  Location: [facility] ENDOSCOPY;  Service: Gastroenterology;;  ? COLONOSCOPY N/A 08/23/2018  ? Procedure: COLONOSCOPY;  Surgeon: Meridee Score Netty Starring., MD;  Location: [facility] ENDOSCOPY;  Service: Gastroenterology;  Laterality: N/A;  ? ENTEROSCOPY N/A 08/23/2018  ? Procedure: ENTEROSCOPY;  Surgeon: Meridee Score Netty Starring., MD;  Location: [facility] ENDOSCOPY;  Service: Gastroenterology;  Laterality: N/A;  ? HEMOSTASIS CLIP PLACEMENT  08/23/2018  ? Procedure: HEMOSTASIS CLIP PLACEMENT;  Surgeon: Lemar Lofty., MD;  Location: [facility] ENDOSCOPY;  Service: Gastroenterology;;  ? IABP INSERTION N/A 09/05/2017  ? Procedure: IABP INSERTION;  Surgeon: Dolores Patty, MD;  Location: [facility] INVASIVE CV LAB;  Service: Cardiovascular;  Laterality: N/A;  ? INSERTION OF IMPLANTABLE LEFT VENTRICULAR ASSIST DEVICE N/A 09/06/2017  ? Procedure: INSERTION OF IMPLANTABLE LEFT VENTRICULAR ASSIST DEVICE - HM3;  Surgeon: Kerin Perna, MD;  Location: [facility] OR;  Service: Open Heart Surgery;  Laterality: N/A;  HM3  ? MUSCLE FLAP CLOSURE N/A 09/05/2019  ? Procedure: MUSCLE FLAP CLOSURE;  Surgeon:  Peggye Form, DO;  Location: [facility] OR;  Service: Plastics;  Laterality: N/A;  ? NASAL FRACTURE SURGERY  1987  ? POLYPECTOMY  08/23/2018  ? Procedure: POLYPECTOMY;  Surgeon: Mansouraty, Netty Starring., MD;  Location: [facility] ENDOSCOPY;  Service: Gastroenterology;;  ? RIGHT HEART CATH N/A 08/30/2017  ? Procedure: RIGHT HEART CATH;  Surgeon: Dolores Patty, MD;  Location: [facility] INVASIVE CV LAB;  Service: Cardiovascular;  Laterality: N/A;  ? RIGHT/LEFT HEART CATH AND CORONARY ANGIOGRAPHY N/A 10/08/2016  ? Procedure: Right/Left Heart Cath and Coronary Angiography;  Surgeon: Orpah Cobb, MD;  Location: [facility] INVASIVE CV LAB;  Service: Cardiovascular;  Laterality: N/A;  ? STERNAL WOUND DEBRIDEMENT N/A 08/20/2019  ? Procedure: DEBRIDEMENT OF LVAD DRIVELINE TUNNEL;  Surgeon: Kerin Perna, MD;  Location: [facility] OR;  Service: Thoracic;  Laterality: N/A;  ? STERNAL WOUND DEBRIDEMENT N/A 09/05/2019  ? Procedure: DEBRIDEMENT AND CLOSURE OF ABDOMINAL WOUND;  Surgeon: Peggye Form, DO;  Location: [facility] OR;  Service: Plastics;  Laterality: N/A;  ? SUBMUCOSAL TATTOO INJECTION  08/23/2018  ? Procedure: SUBMUCOSAL TATTOO INJECTION;  Surgeon: Lemar Lofty., MD;  Location: [facility] ENDOSCOPY;  Service: Gastroenterology;;  ? TEE WITHOUT CARDIOVERSION N/A 09/06/2017  ?  Procedure: TRANSESOPHAGEAL ECHOCARDIOGRAM (TEE);  Surgeon: Donata Clay, Theron Arista, MD;  Location: [facility] OR;  Service: Open Heart Surgery;  Laterality: N/A;  ? WOUND DEBRIDEMENT N/A 08/30/2019  ? Procedure: Debridement Abdominal Wound;  Surgeon: Kerin Perna, MD;  Location: [facility] OR;  Service: Thoracic;  Laterality: N/A;  ? ? ? ?Current Outpatient Medications  ?Medication Sig Dispense Refill  ? acetaminophen (TYLENOL) 500 MG tablet Take 1,500 mg by mouth 3 (three) times daily as needed for headache (pain).    ? albuterol (PROAIR HFA) 108 (90 Base) MCG/ACT inhaler Inhale 2 puffs into the lungs every 6 (six) hours as needed for wheezing or shortness of breath. 1 each 6  ? cephALEXin  (KEFLEX) 500 MG capsule Take 1 capsule (500 mg total) by mouth 3 (three) times daily. 90 capsule 11  ? gabapentin (NEURONTIN) 300 MG capsule Take 1 capsule (300 mg total) by mouth 3 (three) times daily. 90 capsule 6  ? p

## 2021-09-20 NOTE — Progress Notes (Signed)
?  ? ?VAD Clinic Note ? ? ? ?ID:  Jonathan Wood, DOB [birth year], MRN [id number]  Location: Home  ?Provider location: [facility] ?Type of Visit: Established patient ? ?PCP: [facility]  ?Cardiologist:  [name], MD ?Primary HF: Dr [name]  ? ?Chief Complaint: Heart Failure/LVAD  ?History of Present Illness: ? ?Jonathan Wood is a 51 y.o. 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 ? ?Admitted 4/20 with volume overload and anemia. Diuresed with IV lasix and transitioned to torsemide 20 mg daily. GI consulted EGD with gastritis and nonbleeding duodenal ulcers, colonoscopy with 5 polyps removed. ? ?Admitted 12/20 for DL infection with overlying cellulitis and volume overload.  ? ?Admitted 4/21 for DL infection with large subxiphoid abscess. Underwent several debridements and eventually a pec muscle flap. Wound cx + MSSA. Initially on ancef and rifabutin but expanded to cefipime then changed to IV ancef and po rifabutin. Now on oral keflex. ? ?Readmitted 6/21 with superficial abscess and tachypnea. Superficial abscess I&D'd and packed. CT scan done and showed possible deeper fluid collection. Reviewed with TCTS who felt it was muscle flap. Sent to IR for possible drainage but u/s confirmed it was the muscle flap and no drainable fluid collection. Wound cx negative. ID saw and recommended continuing Keflex.  ? ?Admitted 8/21 with lactic acidosis due to cardiogenic shock. Started on 0.25 of milrinone and NE. Ramp echo on 8/13 with severe RV dysfunction. Speed increased 5600-> 6300. Tolerated ok. Repeat ramp echo on 8/20 speed increased to 6500. Diuresed over 40 pounds.  ? ?Admitted 10/21 with  ETOH pancreatitis. Gallbladder ok. Resolved with medical management  ?  ?Patient presents for f/u visit. We saw him last week for recurrent epigastric pain. Concern was for possible recurrent  pnacreatitis but exam was fairly benign and symptoms resolving. However WBC was 17.7 and ESR was 95. Says he feels back to normal today. No fevers or chills. No ab pain. No change in bowel or bladder habits. Appetite back to normal  ?  ?BCX negative. ESR 95 -> 85 WBC 7.3 ?   ?VAD Indication: ?Destination Therapy  ?  ?VAD interrogation & Equipment Management: ?Speed: 4970 ?Flow: 6 ?Power: 6.1 w    ?PI: 2.2 ?Hct: 33 ?  ?Alarms: several LV ?Events: 85 daily ?  ?Primary Controller:  Replace back up battery in 17 months ?Back up controller: did not bring ?  ?Annual Equipment Maintenance on UBC/PM was performed 09/03/20  ?  ?I reviewed the LVAD parameters from today and compared the results to the patient's prior recorded data.  LVAD interrogation was NEGATIVE for significant power changes, NEGATIVE for clinical alarms and STABLE for PI events/speed drops. No programming changes were made and pump is functioning within specified parameters.  ?  ? ?Past Medical History:  ?Diagnosis Date  ? Asthma   ? CHF (congestive heart failure) ([place])   ? a. 09/2016: EF 20-25% with cath showing normal cors  ? GERD (gastroesophageal reflux disease)   ? History of hiatal hernia   ? LVAD (left ventricular assist device) present ([place])   ? OSA on CPAP 09/06/2018  ? Severe OSA with AHI 68/hr on CPAP at 12cm H2O  ? ?Past Surgical History:  ?Procedure Laterality Date  ? APPLICATION OF A-CELL OF CHEST/ABDOMEN N/A 08/24/2019  ? Procedure: Application Of A-Cell Of Chest/Abdomen;  Surgeon: Ivin Poot, MD;  Location:  [facility];  Service: Thoracic;  Laterality: N/A;  ? APPLICATION OF A-CELL OF CHEST/ABDOMEN N/A 09/05/2019  ? Procedure: Application Of A-Cell Of Chest/Abdomen;  Surgeon: Wallace Going, DO;  Location: [place];  Service: Plastics;  Laterality: N/A;  ? APPLICATION OF A-CELL OF CHEST/ABDOMEN  09/03/2019  ? Procedure: Application Of A-Cell Of Chest/Abdomen;  Surgeon: Ivin Poot, MD;  Location: [place];  Service: Thoracic;;  ? APPLICATION  OF WOUND VAC N/A 08/22/2019  ? Procedure: Debridement, Irrigation and Packing of Abdominal Incision.;  Surgeon: Ivin Poot, MD;  Location: [place];  Service: Thoracic;  Laterality: N/A;  ? APPLICATION OF WOUND VAC N/A 08/24/2019  ? Procedure: Irrigation and Debridement with WOUND VAC APPLICATION;  Surgeon: Ivin Poot, MD;  Location: [place];  Service: Thoracic;  Laterality: N/A;  ? APPLICATION OF WOUND VAC N/A 08/30/2019  ? Procedure: APPLICATION OF WOUND VAC;  Surgeon: Prescott Gum, Collier Salina, MD;  Location: [place];  Service: Thoracic;  Laterality: N/A;  ? APPLICATION OF WOUND VAC N/A 09/03/2019  ? Procedure: WOUND VAC CHANGE;  Surgeon: Ivin Poot, MD;  Location: [place];  Service: Thoracic;  Laterality: N/A;  ? BIOPSY  08/23/2018  ? Procedure: BIOPSY;  Surgeon: Irving Copas., MD;  Location: [place];  Service: Gastroenterology;;  ? COLONOSCOPY N/A 08/23/2018  ? Procedure: COLONOSCOPY;  Surgeon: Rush Landmark Telford Nab., MD;  Location: [place];  Service: Gastroenterology;  Laterality: N/A;  ? ENTEROSCOPY N/A 08/23/2018  ? Procedure: ENTEROSCOPY;  Surgeon: Rush Landmark Telford Nab., MD;  Location: [place];  Service: Gastroenterology;  Laterality: N/A;  ? HEMOSTASIS CLIP PLACEMENT  08/23/2018  ? Procedure: HEMOSTASIS CLIP PLACEMENT;  Surgeon: Irving Copas., MD;  Location: [place];  Service: Gastroenterology;;  ? IABP INSERTION N/A 09/05/2017  ? Procedure: IABP INSERTION;  Surgeon: Jolaine Artist, MD;  Location: [place] [facility];  Service: Cardiovascular;  Laterality: N/A;  ? INSERTION OF IMPLANTABLE LEFT VENTRICULAR ASSIST DEVICE N/A 09/06/2017  ? Procedure: INSERTION OF IMPLANTABLE LEFT VENTRICULAR ASSIST DEVICE - HM3;  Surgeon: Ivin Poot, MD;  Location: [place];  Service: Open Heart Surgery;  Laterality: N/A;  HM3  ? MUSCLE FLAP CLOSURE N/A 09/05/2019  ? Procedure: MUSCLE FLAP CLOSURE;  Surgeon: Wallace Going, DO;  Location: [place];  Service: Plastics;  Laterality: N/A;  ?  NASAL FRACTURE SURGERY  1987  ? POLYPECTOMY  08/23/2018  ? Procedure: POLYPECTOMY;  Surgeon: Mansouraty, Telford Nab., MD;  Location: [place];  Service: Gastroenterology;;  ? RIGHT HEART CATH N/A 08/30/2017  ? Procedure: RIGHT HEART CATH;  Surgeon: Jolaine Artist, MD;  Location: [place] [facility];  Service: Cardiovascular;  Laterality: N/A;  ? RIGHT/LEFT HEART CATH AND CORONARY ANGIOGRAPHY N/A 10/08/2016  ? Procedure: Right/Left Heart Cath and Coronary Angiography;  Surgeon: Dixie Dials, MD;  Location: [place] [facility];  Service: Cardiovascular;  Laterality: N/A;  ? STERNAL WOUND DEBRIDEMENT N/A 08/20/2019  ? Procedure: DEBRIDEMENT OF LVAD DRIVELINE TUNNEL;  Surgeon: Ivin Poot, MD;  Location: [place];  Service: Thoracic;  Laterality: N/A;  ? STERNAL WOUND DEBRIDEMENT N/A 09/05/2019  ? Procedure: DEBRIDEMENT AND CLOSURE OF ABDOMINAL WOUND;  Surgeon: Wallace Going, DO;  Location: [place];  Service: Plastics;  Laterality: N/A;  ? SUBMUCOSAL TATTOO INJECTION  08/23/2018  ? Procedure: SUBMUCOSAL TATTOO INJECTION;  Surgeon: Irving Copas., MD;  Location: [place];  Service: Gastroenterology;;  ? TEE WITHOUT CARDIOVERSION N/A 09/06/2017  ? Procedure: TRANSESOPHAGEAL ECHOCARDIOGRAM (TEE);  Surgeon: Prescott Gum, Collier Salina, MD;  Location: [place];  Service: Open Heart Surgery;  Laterality: N/A;  ? WOUND DEBRIDEMENT N/A 08/30/2019  ? Procedure: Debridement Abdominal Wound;  Surgeon: Ivin Poot, MD;  Location: [place];  Service: Thoracic;  Laterality: N/A;  ? ? ? ?Current Outpatient Medications  ?Medication Sig Dispense Refill  ? acetaminophen (TYLENOL) 500 MG tablet Take 1,500 mg by mouth 3 (three) times daily as needed for headache (pain).    ? albuterol (PROAIR HFA) 108 (90 Base) MCG/ACT inhaler Inhale 2 puffs into the lungs every 6 (six) hours as needed for wheezing or shortness of breath. 1 each 6  ? cephALEXin (KEFLEX) 500 MG capsule Take 1 capsule (500 mg total) by mouth 3 (three) times daily. 90  capsule 11  ? gabapentin (NEURONTIN) 300 MG capsule Take 1 capsule (300 mg total) by mouth 3 (three) times daily. 90 capsule 6  ? pantoprazole (PROTONIX) 40 MG tablet Take 1 tablet (40 mg total) by mouth 2

## 2021-09-21 ENCOUNTER — Other Ambulatory Visit (HOSPITAL_COMMUNITY): Payer: Self-pay | Admitting: *Deleted

## 2021-09-21 ENCOUNTER — Telehealth (HOSPITAL_COMMUNITY): Payer: Self-pay | Admitting: *Deleted

## 2021-09-21 DIAGNOSIS — Z7901 Long term (current) use of anticoagulants: Secondary | ICD-10-CM

## 2021-09-21 DIAGNOSIS — Z95811 Presence of heart assist device: Secondary | ICD-10-CM

## 2021-09-21 NOTE — Telephone Encounter (Signed)
Called patient and left message reminder for his VAD clinic appointment this Wednesday, 09/23/21 at 11:30 for INR and dressing change. Asked him to bring loaner UBC with him to that visit and we will provide him with his new UBC. Asked him to call if any questions or if he needed to re-schedule. ? ?Hessie Diener RN, VAD Coordinator ?[phone] ?

## 2021-09-23 ENCOUNTER — Other Ambulatory Visit (HOSPITAL_COMMUNITY): Payer: 59

## 2021-09-29 ENCOUNTER — Ambulatory Visit (HOSPITAL_COMMUNITY)
Admission: RE | Admit: 2021-09-29 | Discharge: 2021-09-29 | Disposition: A | Discharge status: Home / Self Care | Payer: 59 | Source: Ambulatory Visit | Attending: Cardiology | Admitting: Cardiology

## 2021-09-29 DIAGNOSIS — Z7901 Long term (current) use of anticoagulants: Secondary | ICD-10-CM | POA: Insufficient documentation

## 2021-09-29 DIAGNOSIS — Z95811 Presence of heart assist device: Secondary | ICD-10-CM | POA: Insufficient documentation

## 2021-09-29 NOTE — Addendum Note (Signed)
Encounter addended by: Bernita Raisin, RN on: 09/29/2021 12:29 PM ? Actions taken: Clinical Note Signed

## 2021-09-29 NOTE — Progress Notes (Signed)
Patient presents for dressing change and INR in VAD clinic today alone. ? ?Exit Site Care: ?Drive line is being maintained by VAD coordinators. Existing VAD dressing removed and site care performed using sterile technique. Drive line exit site cleaned with CHG swabs x 2 and rinsed with saline, allowed to dry, and Sorbaview dressing with silverlon patch applied. Anchor reapplied and covered with two large tegaderms.  Drive line exit site incorporated, the velour is fully implanted at exit site. Scant dried brown drainage at exit site. No erythema, tenderness, or foul odor noted.  Pt denies fever or chills. Continue weekly dressing changes. VAD coordinators to perform weekly dressing changes. Pt has adequate dressing supplies at home.  ? ? ?Plan: ?Return to [place] clinic in 1 week for dressing change ?Coumadin dosing per Ander Purpura PharmD ? ?Emerson Monte RN ?VAD Coordinator  ?Office: [phone]  ?24/7 Pager: [phone]  ? ? ? ?

## 2021-10-01 ENCOUNTER — Other Ambulatory Visit (HOSPITAL_COMMUNITY): Payer: Self-pay | Admitting: *Deleted

## 2021-10-01 DIAGNOSIS — Z95811 Presence of heart assist device: Secondary | ICD-10-CM

## 2021-10-01 DIAGNOSIS — Z7901 Long term (current) use of anticoagulants: Secondary | ICD-10-CM

## 2021-10-06 ENCOUNTER — Ambulatory Visit (HOSPITAL_COMMUNITY)
Admission: RE | Admit: 2021-10-06 | Discharge: 2021-10-06 | Disposition: A | Discharge status: Home / Self Care | Payer: 59 | Source: Ambulatory Visit | Attending: Cardiology | Admitting: Cardiology

## 2021-10-06 ENCOUNTER — Ambulatory Visit (HOSPITAL_COMMUNITY): Payer: Self-pay | Admitting: Pharmacist

## 2021-10-06 DIAGNOSIS — Z95811 Presence of heart assist device: Secondary | ICD-10-CM | POA: Insufficient documentation

## 2021-10-06 DIAGNOSIS — T827XXA Infection and inflammatory reaction due to other cardiac and vascular devices, implants and grafts, initial encounter: Secondary | ICD-10-CM

## 2021-10-06 DIAGNOSIS — I5042 Chronic combined systolic (congestive) and diastolic (congestive) heart failure: Secondary | ICD-10-CM

## 2021-10-06 DIAGNOSIS — Z7901 Long term (current) use of anticoagulants: Secondary | ICD-10-CM | POA: Insufficient documentation

## 2021-10-06 DIAGNOSIS — I50812 Chronic right heart failure: Secondary | ICD-10-CM

## 2021-10-06 LAB — PROTIME-INR
INR: 1.5 — ABNORMAL HIGH (ref 0.8–1.2)
Prothrombin Time: 18 seconds — ABNORMAL HIGH (ref 11.4–15.2)

## 2021-10-06 MED ORDER — CEPHALEXIN 500 MG PO CAPS: 500.0000 mg | ORAL_CAPSULE | Freq: Three times a day (TID) | ORAL | 11 refills | Status: DC

## 2021-10-06 MED ORDER — SILDENAFIL CITRATE 20 MG PO TABS: 20.0000 mg | ORAL_TABLET | Freq: Three times a day (TID) | ORAL | 11 refills | Status: DC

## 2021-10-06 NOTE — Addendum Note (Signed)
Encounter addended by: Levonne Spiller, RN on: 10/06/2021 11:24 AM  Actions taken: Clinical Note Signed

## 2021-10-06 NOTE — Progress Notes (Signed)
Patient presents for dressing change and INR in VAD clinic today with his mother.    Exit Site Care: Drive line is being maintained by VAD coordinators. Existing VAD dressing removed and site care performed using sterile technique. Drive line exit site cleaned with CHG swabs x 2 and rinsed with saline, allowed to dry, and Sorbaview dressing with silverlon patch applied. Anchor reapplied and covered with two large tegaderms.  Drive line exit site incorporated, the velour is fully implanted at exit site. No erythema, tenderness, drainage, or foul odor noted.  Pt denies fever or chills. Continue weekly dressing changes. VAD coordinators to perform weekly dressing changes.    Plan: Return to clinic in 1 week for dressing change. Pharm-D will call you with Coumadin instructions and INR dosing.  Hessie Diener RN VAD Coordinator  Office: [phone]  24/7 Pager: [phone]

## 2021-10-06 NOTE — Addendum Note (Signed)
Encounter addended by: Levonne Spiller, RN on: 10/06/2021 11:27 AM  Actions taken: Visit diagnoses modified, Pharmacy for encounter modified, Order list changed, Diagnosis association updated

## 2021-10-06 NOTE — Progress Notes (Signed)
LVAD INR 

## 2021-10-07 ENCOUNTER — Other Ambulatory Visit (HOSPITAL_COMMUNITY): Payer: Self-pay | Admitting: *Deleted

## 2021-10-07 DIAGNOSIS — T827XXA Infection and inflammatory reaction due to other cardiac and vascular devices, implants and grafts, initial encounter: Secondary | ICD-10-CM

## 2021-10-07 DIAGNOSIS — I5042 Chronic combined systolic (congestive) and diastolic (congestive) heart failure: Secondary | ICD-10-CM

## 2021-10-07 DIAGNOSIS — Z95811 Presence of heart assist device: Secondary | ICD-10-CM

## 2021-10-07 DIAGNOSIS — Z7901 Long term (current) use of anticoagulants: Secondary | ICD-10-CM

## 2021-10-07 DIAGNOSIS — I50812 Chronic right heart failure: Secondary | ICD-10-CM

## 2021-10-07 MED ORDER — SILDENAFIL CITRATE 20 MG PO TABS: 20.0000 mg | ORAL_TABLET | Freq: Three times a day (TID) | ORAL | 11 refills | Status: DC

## 2021-10-08 ENCOUNTER — Other Ambulatory Visit (HOSPITAL_COMMUNITY): Payer: Self-pay | Admitting: *Deleted

## 2021-10-08 DIAGNOSIS — Z95811 Presence of heart assist device: Secondary | ICD-10-CM

## 2021-10-08 DIAGNOSIS — Z7901 Long term (current) use of anticoagulants: Secondary | ICD-10-CM

## 2021-10-13 ENCOUNTER — Other Ambulatory Visit (HOSPITAL_COMMUNITY): Payer: 59

## 2021-10-14 ENCOUNTER — Ambulatory Visit (HOSPITAL_COMMUNITY): Payer: Self-pay | Admitting: Pharmacist

## 2021-10-14 ENCOUNTER — Ambulatory Visit (HOSPITAL_COMMUNITY)
Admission: RE | Admit: 2021-10-14 | Discharge: 2021-10-14 | Disposition: A | Discharge status: Home / Self Care | Payer: 59 | Source: Ambulatory Visit | Attending: Internal Medicine | Admitting: Internal Medicine

## 2021-10-14 DIAGNOSIS — Z452 Encounter for adjustment and management of vascular access device: Secondary | ICD-10-CM | POA: Insufficient documentation

## 2021-10-14 DIAGNOSIS — Z7901 Long term (current) use of anticoagulants: Secondary | ICD-10-CM

## 2021-10-14 DIAGNOSIS — Z95811 Presence of heart assist device: Secondary | ICD-10-CM

## 2021-10-14 LAB — PROTIME-INR
INR: 1.8 — ABNORMAL HIGH (ref 0.8–1.2)
Prothrombin Time: 20.8 seconds — ABNORMAL HIGH (ref 11.4–15.2)

## 2021-10-14 NOTE — Progress Notes (Signed)
Patient presents for dressing change and INR in VAD clinic today alone.  Exit Site Care: Drive line is being maintained by VAD coordinators. Existing VAD dressing removed and site care performed using sterile technique. Drive line exit site cleaned with CHG swabs x 2 and rinsed with saline, allowed to dry, and Sorbaview dressing with silverlon patch applied. Anchor reapplied and covered with two large tegaderms.  Drive line exit site incorporated, the velour is fully implanted at exit site. No erythema, tenderness, drainage, or foul odor noted.  Pt denies fever or chills. Continue weekly dressing changes. VAD coordinators to perform weekly dressing changes.    Plan: Return to clinic in 1 week for dressing change. Pharm-D will call you with Coumadin instructions and INR dosing.  Carlton Adam RN VAD Coordinator  Office: [phone]  24/7 Pager: [phone]

## 2021-10-14 NOTE — Addendum Note (Signed)
Encounter addended by: Lebron Quam, RN on: 10/14/2021 11:12 AM  Actions taken: Clinical Note Signed

## 2021-10-14 NOTE — Progress Notes (Signed)
LVAD INR 

## 2021-10-21 ENCOUNTER — Ambulatory Visit (HOSPITAL_COMMUNITY)
Admission: RE | Admit: 2021-10-21 | Discharge: 2021-10-21 | Disposition: A | Discharge status: Home / Self Care | Payer: 59 | Source: Ambulatory Visit | Attending: Cardiology | Admitting: Cardiology

## 2021-10-21 ENCOUNTER — Other Ambulatory Visit (HOSPITAL_COMMUNITY): Payer: 59

## 2021-10-21 DIAGNOSIS — Z4801 Encounter for change or removal of surgical wound dressing: Secondary | ICD-10-CM | POA: Diagnosis present

## 2021-10-21 DIAGNOSIS — Z95811 Presence of heart assist device: Secondary | ICD-10-CM | POA: Insufficient documentation

## 2021-10-21 NOTE — Progress Notes (Signed)
Patient presents for dressing change  in VAD clinic today alone.  Exit Site Care: Drive line is being maintained by VAD coordinators. Existing VAD dressing removed and site care performed using sterile technique. Drive line exit site cleaned with CHG swabs x 2 and rinsed with saline, allowed to dry, and Sorbaview dressing with silverlon patch applied. Anchor reapplied and covered with two large tegaderms.  Drive line exit site incorporated, the velour is fully implanted at exit site. No erythema, tenderness, drainage, or foul odor noted.  Pt denies fever or chills. Continue weekly dressing changes. VAD coordinators to perform weekly dressing changes.    Plan: Return to clinic in 1 week for dressing change and INR.   Alyce Pagan RN VAD Coordinator  Office: [phone]  24/7 Pager: [phone]

## 2021-10-22 ENCOUNTER — Other Ambulatory Visit (HOSPITAL_COMMUNITY): Payer: 59

## 2021-10-22 ENCOUNTER — Other Ambulatory Visit (HOSPITAL_COMMUNITY): Payer: Self-pay | Admitting: *Deleted

## 2021-10-22 DIAGNOSIS — I5042 Chronic combined systolic (congestive) and diastolic (congestive) heart failure: Secondary | ICD-10-CM

## 2021-10-22 DIAGNOSIS — Z7901 Long term (current) use of anticoagulants: Secondary | ICD-10-CM

## 2021-10-22 DIAGNOSIS — Z95811 Presence of heart assist device: Secondary | ICD-10-CM

## 2021-10-28 ENCOUNTER — Other Ambulatory Visit (HOSPITAL_COMMUNITY): Payer: 59

## 2021-11-02 ENCOUNTER — Ambulatory Visit (HOSPITAL_COMMUNITY)
Admission: RE | Admit: 2021-11-02 | Discharge: 2021-11-02 | Disposition: A | Discharge status: Home / Self Care | Payer: 59 | Source: Ambulatory Visit | Attending: Internal Medicine | Admitting: Internal Medicine

## 2021-11-02 ENCOUNTER — Ambulatory Visit (HOSPITAL_COMMUNITY): Payer: Self-pay | Admitting: Pharmacist

## 2021-11-02 DIAGNOSIS — Z4801 Encounter for change or removal of surgical wound dressing: Secondary | ICD-10-CM | POA: Insufficient documentation

## 2021-11-02 DIAGNOSIS — Z7901 Long term (current) use of anticoagulants: Secondary | ICD-10-CM | POA: Diagnosis not present

## 2021-11-02 DIAGNOSIS — I5042 Chronic combined systolic (congestive) and diastolic (congestive) heart failure: Secondary | ICD-10-CM | POA: Insufficient documentation

## 2021-11-02 DIAGNOSIS — Z95811 Presence of heart assist device: Secondary | ICD-10-CM | POA: Diagnosis not present

## 2021-11-02 LAB — PROTIME-INR
INR: 1.5 — ABNORMAL HIGH (ref 0.8–1.2)
Prothrombin Time: 18 seconds — ABNORMAL HIGH (ref 11.4–15.2)

## 2021-11-02 NOTE — Progress Notes (Signed)
Patient presents for dressing change and INR in VAD clinic today alone.  Exit Site Care: Drive line is being maintained by VAD coordinators. Existing VAD dressing removed and site care performed using sterile technique. Drive line exit site cleaned with CHG swabs x 2 and rinsed with saline, allowed to dry, and Sorbaview dressing with silverlon patch applied. Anchor reapplied and covered with two large tegaderms.  Drive line exit site incorporated, the velour is fully implanted at exit site. No erythema, tenderness, drainage, or foul odor noted.  Pt denies fever or chills. Continue weekly dressing changes. VAD coordinators to perform weekly dressing changes.    Plan: Return to clinic in 1 week for full visit with Dr [name] Coumadin dosing per Leotis Shames PharmD   Alyce Pagan RN VAD Coordinator  Office: [phone]  24/7 Pager: [phone]

## 2021-11-02 NOTE — Progress Notes (Signed)
LVAD INR 

## 2021-11-09 ENCOUNTER — Other Ambulatory Visit (HOSPITAL_COMMUNITY): Payer: Self-pay | Admitting: *Deleted

## 2021-11-09 DIAGNOSIS — Z7901 Long term (current) use of anticoagulants: Secondary | ICD-10-CM

## 2021-11-09 DIAGNOSIS — Z95811 Presence of heart assist device: Secondary | ICD-10-CM

## 2021-11-11 ENCOUNTER — Encounter (HOSPITAL_COMMUNITY): Payer: Self-pay

## 2021-11-11 ENCOUNTER — Ambulatory Visit (HOSPITAL_COMMUNITY)
Admission: RE | Admit: 2021-11-11 | Discharge: 2021-11-11 | Disposition: A | Discharge status: Home / Self Care | Payer: 59 | Source: Ambulatory Visit | Attending: Cardiology | Admitting: Cardiology

## 2021-11-11 ENCOUNTER — Ambulatory Visit (HOSPITAL_COMMUNITY): Payer: Self-pay | Admitting: Pharmacist

## 2021-11-11 VITALS — BP 123/92 | HR 90 | Wt 210.2 lb

## 2021-11-11 DIAGNOSIS — Z95811 Presence of heart assist device: Secondary | ICD-10-CM

## 2021-11-11 DIAGNOSIS — I5022 Chronic systolic (congestive) heart failure: Secondary | ICD-10-CM | POA: Insufficient documentation

## 2021-11-11 DIAGNOSIS — I110 Hypertensive heart disease with heart failure: Secondary | ICD-10-CM | POA: Diagnosis not present

## 2021-11-11 DIAGNOSIS — Z720 Tobacco use: Secondary | ICD-10-CM

## 2021-11-11 DIAGNOSIS — Z79899 Other long term (current) drug therapy: Secondary | ICD-10-CM | POA: Diagnosis not present

## 2021-11-11 DIAGNOSIS — R109 Unspecified abdominal pain: Secondary | ICD-10-CM | POA: Diagnosis not present

## 2021-11-11 DIAGNOSIS — G4733 Obstructive sleep apnea (adult) (pediatric): Secondary | ICD-10-CM | POA: Diagnosis not present

## 2021-11-11 DIAGNOSIS — Z8249 Family history of ischemic heart disease and other diseases of the circulatory system: Secondary | ICD-10-CM | POA: Diagnosis not present

## 2021-11-11 DIAGNOSIS — F17210 Nicotine dependence, cigarettes, uncomplicated: Secondary | ICD-10-CM | POA: Insufficient documentation

## 2021-11-11 DIAGNOSIS — Z7901 Long term (current) use of anticoagulants: Secondary | ICD-10-CM | POA: Diagnosis not present

## 2021-11-11 DIAGNOSIS — I10 Essential (primary) hypertension: Secondary | ICD-10-CM

## 2021-11-11 DIAGNOSIS — R1011 Right upper quadrant pain: Secondary | ICD-10-CM

## 2021-11-11 LAB — BASIC METABOLIC PANEL
Anion gap: 9 (ref 5–15)
BUN: 15 mg/dL (ref 6–20)
CO2: 21 mmol/L — ABNORMAL LOW (ref 22–32)
Calcium: 9.1 mg/dL (ref 8.9–10.3)
Chloride: 109 mmol/L (ref 98–111)
Creatinine, Ser: 0.9 mg/dL (ref 0.61–1.24)
GFR, Estimated: >60 mL/min (ref >60)
Glucose, Bld: 123 mg/dL — ABNORMAL HIGH (ref 70–99)
Potassium: 3.9 mmol/L (ref 3.5–5.1)
Sodium: 139 mmol/L (ref 135–145)

## 2021-11-11 LAB — LACTATE DEHYDROGENASE: LDH: 163 U/L (ref 98–192)

## 2021-11-11 LAB — CBC
HCT: 35.1 % — ABNORMAL LOW (ref 39.0–52.0)
Hemoglobin: 10.8 g/dL — ABNORMAL LOW (ref 13.0–17.0)
MCH: 26.5 pg (ref 26.0–34.0)
MCHC: 30.8 g/dL (ref 30.0–36.0)
MCV: 86.2 fL (ref 80.0–100.0)
Platelets: 184 K/uL (ref 150–400)
RBC: 4.07 MIL/uL — ABNORMAL LOW (ref 4.22–5.81)
RDW: 15.2 % (ref 11.5–15.5)
WBC: 4.8 K/uL (ref 4.0–10.5)
nRBC: 0 % (ref 0.0–0.2)

## 2021-11-11 LAB — PROTIME-INR
INR: 1.1 (ref 0.8–1.2)
Prothrombin Time: 14.3 seconds (ref 11.4–15.2)

## 2021-11-11 MED ORDER — DOXAZOSIN MESYLATE 1 MG PO TABS: 1.0000 mg | ORAL_TABLET | Freq: Every day | ORAL | 5 refills | Status: DC

## 2021-11-11 MED ORDER — WARFARIN SODIUM 5 MG PO TABS: ORAL_TABLET | ORAL | 6 refills | Status: DC

## 2021-11-11 MED ORDER — ENOXAPARIN SODIUM 40 MG/0.4ML IJ SOSY
40.0000 mg | PREFILLED_SYRINGE | Freq: Two times a day (BID) | INTRAMUSCULAR | 0 refills | Status: DC
Start: 2021-11-11 — End: 2023-01-13

## 2021-11-11 NOTE — Progress Notes (Signed)
LVAD INR 

## 2021-11-11 NOTE — Progress Notes (Signed)
VAD Clinic Note    ID:  Jonathan, Wood 19-May-1970, MRN [id number]  Location: Home  Provider location: [facility] Type of Visit: Established patient  PCP: [name]  Cardiologist:  Arvilla Meres, MD Primary HF: Dr [name]   Chief Complaint: Heart Failure/LVAD  History of Present Illness:  Jonathan Wood is a 51 y.o. 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  Admitted 4/20 with volume overload and anemia. Diuresed with IV lasix and transitioned to torsemide 20 mg daily. GI consulted EGD with gastritis and nonbleeding duodenal ulcers, colonoscopy with 5 polyps removed.  Admitted 12/20 for DL infection with overlying cellulitis and volume overload.   Admitted 4/21 for DL infection with large subxiphoid abscess. Underwent several debridements and eventually a pec muscle flap. Wound cx + MSSA. Initially on ancef and rifabutin but expanded to cefipime then changed to IV ancef and po rifabutin. Now on oral keflex.  Readmitted 6/21 with superficial abscess and tachypnea. Superficial abscess I&D'd and packed. CT scan done and showed possible deeper fluid collection. Reviewed with TCTS who felt it was muscle flap. Sent to IR for possible drainage but u/s confirmed it was the muscle flap and no drainable fluid collection. Wound cx negative. ID saw and recommended continuing Keflex.   Admitted 8/21 with lactic acidosis due to cardiogenic shock. Started on 0.25 of milrinone and NE. Ramp echo on 8/13 with severe RV dysfunction. Speed increased 5600-> 6300. Tolerated ok. Repeat ramp echo on 8/20 speed increased to 6500. Diuresed over 40 pounds.   Admitted 10/21 with  ETOH pancreatitis. Gallbladder ok. Resolved with medical management    Patient presents for f/u visit. Says he feels great. Remains active. Did not tolerate amlodipine due to low BP. Denies  orthopnea or PND. No fevers, chills or problems with driveline. No bleeding, melena or neuro symptoms. No VAD alarms. Taking all meds as prescribed.     VAD Indication: Destination Therapy    VAD interrogation & Equipment Management: Speed: 6500 Flow: 5.6 Power: 6.0 w    PI: 3.9 Hct: 44   Alarms: none Events: 60+ daily   Primary Controller:  Replace back up battery in 15 months Back up controller: did not bring   Annual Equipment Maintenance on UBC/PM was performed 09/03/20    I reviewed the LVAD parameters from today and compared the results to the patient's prior recorded data.  LVAD interrogation was NEGATIVE for significant power changes, NEGATIVE for clinical alarms and STABLE for PI events/speed drops. No programming changes were made and pump is functioning within specified parameters.   Past Medical History:  Diagnosis Date   Asthma    CHF (congestive heart failure) (HCC)    a. 09/2016: EF 20-25% with cath showing normal cors   GERD (gastroesophageal reflux disease)    History of hiatal hernia    LVAD (left ventricular assist device) present (HCC)    OSA on CPAP 09/06/2018   Severe OSA with AHI 68/hr on CPAP at 12cm H2O   Past Surgical History:  Procedure Laterality Date   APPLICATION OF A-CELL OF CHEST/ABDOMEN N/A 08/24/2019   Procedure: Application Of A-Cell Of Chest/Abdomen;  Surgeon: Kerin Perna, MD;  Location: [facility] OR;  Service: Thoracic;  Laterality: N/A;   APPLICATION OF A-CELL OF CHEST/ABDOMEN N/A 09/05/2019   Procedure: Application Of A-Cell Of Chest/Abdomen;  Surgeon: Peggye Form,  DO;  Location: [facility] OR;  Service: Plastics;  Laterality: N/A;   APPLICATION OF A-CELL OF CHEST/ABDOMEN  09/03/2019   Procedure: Application Of A-Cell Of Chest/Abdomen;  Surgeon: Kerin Perna, MD;  Location: [facility] OR;  Service: Thoracic;;   APPLICATION OF WOUND VAC N/A 08/22/2019   Procedure: Debridement, Irrigation and Packing of Abdominal Incision.;  Surgeon: Kerin Perna, MD;  Location: [facility] OR;  Service: Thoracic;  Laterality: N/A;   APPLICATION OF WOUND VAC N/A 08/24/2019   Procedure: Irrigation and Debridement with WOUND VAC APPLICATION;  Surgeon: Kerin Perna, MD;  Location: [facility] OR;  Service: Thoracic;  Laterality: N/A;   APPLICATION OF WOUND VAC N/A 08/30/2019   Procedure: APPLICATION OF WOUND VAC;  Surgeon: Kerin Perna, MD;  Location: [facility] OR;  Service: Thoracic;  Laterality: N/A;   APPLICATION OF WOUND VAC N/A 09/03/2019   Procedure: WOUND VAC CHANGE;  Surgeon: Kerin Perna, MD;  Location: [facility] OR;  Service: Thoracic;  Laterality: N/A;   BIOPSY  08/23/2018   Procedure: BIOPSY;  Surgeon: Lemar Lofty., MD;  Location: [facility] ENDOSCOPY;  Service: Gastroenterology;;   COLONOSCOPY N/A 08/23/2018   Procedure: COLONOSCOPY;  Surgeon: Lemar Lofty., MD;  Location: [facility] ENDOSCOPY;  Service: Gastroenterology;  Laterality: N/A;   ENTEROSCOPY N/A 08/23/2018   Procedure: ENTEROSCOPY;  Surgeon: Meridee Score Netty Starring., MD;  Location: [facility] ENDOSCOPY;  Service: Gastroenterology;  Laterality: N/A;   HEMOSTASIS CLIP PLACEMENT  08/23/2018   Procedure: HEMOSTASIS CLIP PLACEMENT;  Surgeon: Lemar Lofty., MD;  Location: [facility] ENDOSCOPY;  Service: Gastroenterology;;   IABP INSERTION N/A 09/05/2017   Procedure: IABP INSERTION;  Surgeon: Dolores Patty, MD;  Location: [facility] INVASIVE CV LAB;  Service: Cardiovascular;  Laterality: N/A;   INSERTION OF IMPLANTABLE LEFT VENTRICULAR ASSIST DEVICE N/A 09/06/2017   Procedure: INSERTION OF IMPLANTABLE LEFT VENTRICULAR ASSIST DEVICE - HM3;  Surgeon: Kerin Perna, MD;  Location: [facility] OR;  Service: Open Heart Surgery;  Laterality: N/A;  HM3   MUSCLE FLAP CLOSURE N/A 09/05/2019   Procedure: MUSCLE FLAP CLOSURE;  Surgeon: Peggye Form, DO;  Location: [facility] OR;  Service: Plastics;  Laterality: N/A;   NASAL FRACTURE SURGERY  1987   POLYPECTOMY  08/23/2018   Procedure: POLYPECTOMY;  Surgeon: Mansouraty, Netty Starring., MD;   Location: [facility] ENDOSCOPY;  Service: Gastroenterology;;   RIGHT HEART CATH N/A 08/30/2017   Procedure: RIGHT HEART CATH;  Surgeon: Dolores Patty, MD;  Location: [facility] INVASIVE CV LAB;  Service: Cardiovascular;  Laterality: N/A;   RIGHT/LEFT HEART CATH AND CORONARY ANGIOGRAPHY N/A 10/08/2016   Procedure: Right/Left Heart Cath and Coronary Angiography;  Surgeon: Orpah Cobb, MD;  Location: [facility] INVASIVE CV LAB;  Service: Cardiovascular;  Laterality: N/A;   STERNAL WOUND DEBRIDEMENT N/A 08/20/2019   Procedure: DEBRIDEMENT OF LVAD DRIVELINE TUNNEL;  Surgeon: Kerin Perna, MD;  Location: [facility] OR;  Service: Thoracic;  Laterality: N/A;   STERNAL WOUND DEBRIDEMENT N/A 09/05/2019   Procedure: DEBRIDEMENT AND CLOSURE OF ABDOMINAL WOUND;  Surgeon: Peggye Form, DO;  Location: [facility] OR;  Service: Plastics;  Laterality: N/A;   SUBMUCOSAL TATTOO INJECTION  08/23/2018   Procedure: SUBMUCOSAL TATTOO INJECTION;  Surgeon: Meridee Score Netty Starring., MD;  Location: [facility] ENDOSCOPY;  Service: Gastroenterology;;   TEE WITHOUT CARDIOVERSION N/A 09/06/2017   Procedure: TRANSESOPHAGEAL ECHOCARDIOGRAM (TEE);  Surgeon: Donata Clay, Theron Arista, MD;  Location: [facility] OR;  Service: Open Heart Surgery;  Laterality: N/A;   WOUND DEBRIDEMENT N/A 08/30/2019   Procedure: Debridement Abdominal Wound;  Surgeon: Donata Clay, Theron Arista, MD;  Location: [facility] OR;  Service: Thoracic;  Laterality: N/A;     Current Outpatient Medications  Medication Sig Dispense Refill   acetaminophen (TYLENOL) 500 MG tablet Take 1,500 mg by mouth 3 (three) times daily as needed for headache (pain).     cephALEXin (KEFLEX) 500 MG capsule Take 1 capsule (500 mg total) by mouth 3 (three) times daily. 90 capsule 11   doxazosin (CARDURA) 1 MG tablet Take 1 tablet (1 mg total) by mouth at bedtime. 30 tablet 5   gabapentin (NEURONTIN) 300 MG capsule Take 1 capsule (300 mg total) by mouth 3 (three) times daily. 90 capsule 6   pantoprazole (PROTONIX) 40 MG tablet Take 1 tablet (40 mg  total) by mouth 2 (two) times daily. 180 tablet 3   sacubitril-valsartan (ENTRESTO) 97-103 MG Take 1 tablet by mouth 2 (two) times daily. 60 tablet 6   sildenafil (REVATIO) 20 MG tablet Take 1 tablet (20 mg total) by mouth 3 (three) times daily. 90 tablet 11   spironolactone (ALDACTONE) 25 MG tablet Take 0.5 tablets (12.5 mg total) by mouth daily. 30 tablet 6   albuterol (PROAIR HFA) 108 (90 Base) MCG/ACT inhaler Inhale 2 puffs into the lungs every 6 (six) hours as needed for wheezing or shortness of breath. (Patient not taking: Reported on 11/11/2021) 1 each 6   enoxaparin (LOVENOX) 40 MG/0.4ML injection Inject 0.4 mLs (40 mg total) into the skin every 12 (twelve) hours. 4 mL 0   nicotine (NICODERM CQ - DOSED IN MG/24 HOURS) 21 mg/24hr patch Place 1 patch (21 mg total) onto the skin daily. (Patient not taking: Reported on 04/29/2021) 28 patch 3   torsemide (DEMADEX) 20 MG tablet Take 1 tablet (20 mg total) by mouth every other day. (Patient not taking: Reported on 06/26/2020) 90 tablet 3   traMADol HCl 100 MG TABS Take 100 mg by mouth every 8 (eight) hours as needed. (Patient not taking: Reported on 09/07/2021) 10 tablet 0   warfarin (COUMADIN) 5 MG tablet Take 5 mg (1 tab) every Tuesday/Thursday/Saturday and 7.5 mg (1.5 tab) all other days OR as directed by HF clinic 180 tablet 6   No current facility-administered medications for this encounter.    Allergies:   Patient has no known allergies.   Social History:  The patient  reports that he has quit smoking. His smoking use included cigarettes. He has a 12.50 pack-year smoking history. He has never used smokeless tobacco. He reports that he does not currently use alcohol. He reports that he does not currently use drugs after having used the following drugs: Marijuana.   Family History:  The patient's family history includes Alcohol abuse in his cousin; Hypertension in his father.   ROS:  Please see the history of present illness.   All other  systems are personally reviewed and negative.     Vital Signs: Temp:  Doppler: 145 Automatic BP: 123/92 (103) HR: 90 SPO2: 99%   Weight: 201.2 lbs w/ eqt Last wt: 204.4 lbs w/ equip    Exam: General:  NAD.  HEENT: normal  Neck: supple. JVP not elevated.  Carotids 2+ bilat; no bruits. No lymphadenopathy or thryomegaly appreciated. Cor: LVAD hum.  Lungs: Clear. Abdomen: soft, nontender, non-distended. No hepatosplenomegaly. No bruits or masses. Good bowel sounds. Driveline site clean. Anchor in place.  Extremities: no cyanosis, clubbing, rash. Warm no edema  Neuro: alert & oriented x 3. No focal deficits. Moves all 4 without problem  Recent Labs: 09/07/2021: ALT 31 11/11/2021: BUN 15; Creatinine, Ser 0.90; Hemoglobin 10.8; Platelets 184; Potassium 3.9; Sodium 139    Wt Readings from Last 3 Encounters:  11/11/21 95.3 kg (210 lb 3.2 oz)  09/07/21 92.7 kg (204 lb 6.4 oz)  07/15/21 94.6 kg (208 lb 9.6 oz)      ASSESSMENT AND PLAN:  1. Ab pain - symptoms resolved. - ? Passed gallstone  2. 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 Treated with milrinone and then weaned off.  Diuresed 40 pounds. VAD speed increased 5600 -> 6300 - He continues to do very well. NYHA I volume looks good  - Continue Entresto and spiro.  - We have recently been discussing transplant. He is interested but needs to stop smoking and have 6 months of negative cotinine tests. We again encouraged him to work on smoking cessation   3. HM3 LVAD 09/06/2017.  - VAD interrogated personally. Parameters stable.  - LDH 163 - INR 1.1  goal 2.0-2.5  Discussed dosing with PharmD personally. - Off ASA with PUD.  - hgb 10.5 -> 10.8 - DL site looks good  4.  Essential HTN - MAPs elevated again. Did not tolerate amlodipine due to low BP. - Will try cardura 1mg  po qhs   5. H/o ETOH pancreatitis, 11/21 -  remains quit from drinking (was stressed about his son) - see plan above  6. Recurrent DL infection with subxiphoid abscess - s/p debridement and pec flap in 4/21 - remains on keflex. Site continues to look good. No change - He is not a candidate for pump exchange given severe RV dysfunction   7.  OSA - sleep study with very severe OSA (AHI 69/hr) - He has refused therapy multiple times but continues to lose weight. We have offered repeat testing to see if OSA has improved but he refuses.  - No change  8.  H/o GI bleed  - 08/2018 EGD showed gastritis and nonbleeding duodenal ulcers, colonoscopy with 5 polyps removed.  - Remains on PPI. No recent GI bleeding  8. Tobacco Abuse  - continues to smoke a few cigs/day. No change - we discussed again that he needs to stop smoking to qualify for transplant.  Total time spent 35 minutes. Over half that time spent discussing above.   Signed, 09/2018, MD  11/11/2021 3:03 PM  [facility] [address] [facility] [place] [phone] (office) [phone] (fax)

## 2021-11-11 NOTE — Patient Instructions (Addendum)
Start Cardura 1 mg daily at bedtime 2. Coumadin dosing per Leotis Shames PharmD 3. Return to VAD clinic in 1 week for dressing change, INR, and BMET 4. Return to VAD clinic in 2 months for follow up with Dr [name]

## 2021-11-11 NOTE — Progress Notes (Signed)
Patient presents for 2 month f/u in VAD clinic today alone. Denies any issues with VAD or equipment.   Pt states he is feeling great. He is working at [employer] and [employer] Monday-Friday 12p- 530p. He loves this. He is riding his bike for several miles on Sunday's with his son. Denies lightheadedness, dizziness, falls, heart failure symptoms, and signs of bleeding.   No Torsemide since last visit. BP elevated today. He is taking all medications as prescribed. Per Dr [name] start Cardura 1 mg daily at bedtime. Will plan to recheck BP next week.   Vital Signs: Temp:  Doppler: 145 Automatic BP: 123/92 (103) HR: 90 SPO2: 99%   Weight: 201.2 lbs w/ eqt Last wt: 204.4 lbs w/ equip   VAD Indication: Destination Therapy    VAD interrogation & Equipment Management: Speed: 6500 Flow: 5.6 Power: 6.0 w    PI: 3.9 Hct: 44  Alarms: none Events: 60+ daily  Primary Controller:  Replace back up battery in 15 months Back up controller: did not bring  Annual Equipment Maintenance on UBC/PM was performed 09/03/20   I reviewed the LVAD parameters from today and compared the results to the patient's prior recorded data.  LVAD interrogation was NEGATIVE for significant power changes, NEGATIVE for clinical alarms and STABLE for PI events/speed drops. No programming changes were made and pump is functioning within specified parameters.   Exit Site Care: Drive line is being maintained weekly by VAD coordinators. Existing VAD dressing removed and site care performed using sterile technique. Drive line exit site cleaned with Chlora prep applicators x 2, RINSED WITH SALINE allowed to dry, and Sorbaview dressing with Silverlon patch reapplied. Anchor reapplied. Covered with 2 large tegaderms. Drive line exit site incorporated, the velour is fully implanted at exit site. No drainage, erythema, tenderness, or foul odor noted.  Pt denies fever or chills. Continue weekly dressing changes. VAD coordinators to  perform weekly dressing changes. Pt has adequate dressing supplies at home.   Device: none  BP & Labs:  Doppler 145 - reflecting modified systolic   Hgb 10.8 - No S/S of bleeding. Specifically denies melena/BRBPR or nosebleeds.    LDH 163 - established baseline of 200-300. Denies tea-colored urine. No power elevations noted on interrogation.   Patient Instructions:  Start Cardura 1 mg daily at bedtime 2. Coumadin dosing per Leotis Shames PharmD 3. Return to VAD clinic in 1 week for dressing change, INR, and BMET 4. Return to VAD clinic in 2 months for follow up with Dr [name]  Alyce Pagan RN VAD Coordinator  Office: [phone]  24/7 Pager: [phone]

## 2021-11-12 ENCOUNTER — Other Ambulatory Visit (HOSPITAL_COMMUNITY): Payer: Self-pay | Admitting: *Deleted

## 2021-11-12 DIAGNOSIS — Z95811 Presence of heart assist device: Secondary | ICD-10-CM

## 2021-11-12 DIAGNOSIS — I5022 Chronic systolic (congestive) heart failure: Secondary | ICD-10-CM

## 2021-11-12 DIAGNOSIS — Z7901 Long term (current) use of anticoagulants: Secondary | ICD-10-CM

## 2021-11-18 ENCOUNTER — Other Ambulatory Visit (HOSPITAL_COMMUNITY): Payer: 59

## 2021-11-20 ENCOUNTER — Ambulatory Visit (HOSPITAL_COMMUNITY): Payer: Self-pay | Admitting: Pharmacist

## 2021-11-20 ENCOUNTER — Other Ambulatory Visit (HOSPITAL_COMMUNITY): Payer: Self-pay | Admitting: *Deleted

## 2021-11-20 ENCOUNTER — Ambulatory Visit (HOSPITAL_COMMUNITY)
Admission: RE | Admit: 2021-11-20 | Discharge: 2021-11-20 | Disposition: A | Discharge status: Home / Self Care | Payer: 59 | Source: Ambulatory Visit | Attending: Cardiology | Admitting: Cardiology

## 2021-11-20 DIAGNOSIS — Z4801 Encounter for change or removal of surgical wound dressing: Secondary | ICD-10-CM | POA: Diagnosis not present

## 2021-11-20 DIAGNOSIS — Z7901 Long term (current) use of anticoagulants: Secondary | ICD-10-CM | POA: Diagnosis not present

## 2021-11-20 DIAGNOSIS — I5022 Chronic systolic (congestive) heart failure: Secondary | ICD-10-CM | POA: Insufficient documentation

## 2021-11-20 DIAGNOSIS — Z95811 Presence of heart assist device: Secondary | ICD-10-CM | POA: Diagnosis present

## 2021-11-20 LAB — BASIC METABOLIC PANEL
Anion gap: 9 (ref 5–15)
BUN: 9 mg/dL (ref 6–20)
CO2: 22 mmol/L (ref 22–32)
Calcium: 8.7 mg/dL — ABNORMAL LOW (ref 8.9–10.3)
Chloride: 108 mmol/L (ref 98–111)
Creatinine, Ser: 0.96 mg/dL (ref 0.61–1.24)
GFR, Estimated: >60 mL/min (ref >60)
Glucose, Bld: 122 mg/dL — ABNORMAL HIGH (ref 70–99)
Potassium: 3.9 mmol/L (ref 3.5–5.1)
Sodium: 139 mmol/L (ref 135–145)

## 2021-11-20 LAB — PROTIME-INR
INR: 1.5 — ABNORMAL HIGH (ref 0.8–1.2)
Prothrombin Time: 17.6 seconds — ABNORMAL HIGH (ref 11.4–15.2)

## 2021-11-20 NOTE — Progress Notes (Signed)
Patient presents for dressing change, BP check and labs in VAD clinic today alone.  Pts BP was elevated at his last visit. Pt confirmed that he started Cardura 1 mg qhs.  Automatic BP: 111/59 (85)  Exit Site Care: Drive line is being maintained by VAD coordinators. Existing VAD dressing removed and site care performed using sterile technique. Drive line exit site cleaned with CHG swabs x 2 and rinsed with saline, allowed to dry, and Sorbaview dressing with silverlon patch applied. Anchor reapplied and covered with two large tegaderms.  Drive line exit site incorporated, the velour is fully implanted at exit site. No erythema, tenderness, drainage, or foul odor noted.  Pt denies fever or chills. Continue weekly dressing changes. VAD coordinators to perform weekly dressing changes. Pt given 4 weekly dressing kits for home use.   Plan: Return to clinic in 1 week for dressing change. Coumadin dosing per Lauren PharmD   Carlton Adam RN VAD Coordinator  Office: [phone]  24/7 Pager: [phone]

## 2021-11-25 ENCOUNTER — Other Ambulatory Visit (HOSPITAL_COMMUNITY): Payer: Self-pay | Admitting: *Deleted

## 2021-11-25 DIAGNOSIS — Z7901 Long term (current) use of anticoagulants: Secondary | ICD-10-CM

## 2021-11-25 DIAGNOSIS — Z95811 Presence of heart assist device: Secondary | ICD-10-CM

## 2021-11-27 ENCOUNTER — Other Ambulatory Visit (HOSPITAL_COMMUNITY): Payer: 59

## 2021-12-01 ENCOUNTER — Ambulatory Visit (HOSPITAL_COMMUNITY): Payer: Self-pay | Admitting: Pharmacist

## 2021-12-01 ENCOUNTER — Ambulatory Visit (HOSPITAL_COMMUNITY)
Admission: RE | Admit: 2021-12-01 | Discharge: 2021-12-01 | Disposition: A | Discharge status: Home / Self Care | Payer: 59 | Source: Ambulatory Visit | Attending: Internal Medicine | Admitting: Internal Medicine

## 2021-12-01 DIAGNOSIS — Z48812 Encounter for surgical aftercare following surgery on the circulatory system: Secondary | ICD-10-CM | POA: Insufficient documentation

## 2021-12-01 DIAGNOSIS — Z7901 Long term (current) use of anticoagulants: Secondary | ICD-10-CM | POA: Diagnosis not present

## 2021-12-01 DIAGNOSIS — Z95811 Presence of heart assist device: Secondary | ICD-10-CM | POA: Insufficient documentation

## 2021-12-01 LAB — PROTIME-INR
INR: 1.8 — ABNORMAL HIGH (ref 0.8–1.2)
Prothrombin Time: 20.9 seconds — ABNORMAL HIGH (ref 11.4–15.2)

## 2021-12-01 NOTE — Progress Notes (Signed)
Patient presents for dressing change and INR in VAD Clinic today, alone.    Exit Site Care: Drive line is being maintained by VAD coordinators. Existing VAD dressing removed and site care performed using sterile technique. Drive line exit site cleaned with CHG swabs x 2 and rinsed with saline, allowed to dry, and Sorbaview dressing with silverlon patch applied. Anchor reapplied and covered with three large tegaderms.  Drive line exit site incorporated, the velour is fully implanted at exit site. No erythema, tenderness, drainage, or foul odor noted.  Pt denies fever or chills. Continue weekly dressing changes. VAD coordinators to perform weekly dressing changes.    Plan: Return to clinic in 1 week for dressing change. Coumadin dosing per Lauren PharmD   Hessie Diener RN VAD Coordinator  Office: [phone]  24/7 Pager: [phone]

## 2021-12-01 NOTE — Addendum Note (Signed)
Encounter addended by: Levonne Spiller, RN on: 12/01/2021 9:58 AM  Actions taken: Clinical Note Signed

## 2021-12-09 ENCOUNTER — Other Ambulatory Visit (HOSPITAL_COMMUNITY): Payer: 59

## 2021-12-10 ENCOUNTER — Ambulatory Visit (HOSPITAL_COMMUNITY)
Admission: RE | Admit: 2021-12-10 | Discharge: 2021-12-10 | Disposition: A | Discharge status: Home / Self Care | Payer: 59 | Source: Ambulatory Visit | Attending: Cardiology | Admitting: Cardiology

## 2021-12-10 DIAGNOSIS — Z95811 Presence of heart assist device: Secondary | ICD-10-CM | POA: Insufficient documentation

## 2021-12-10 DIAGNOSIS — Z4801 Encounter for change or removal of surgical wound dressing: Secondary | ICD-10-CM | POA: Diagnosis not present

## 2021-12-10 NOTE — Progress Notes (Signed)
Patient presents for dressing change in VAD Clinic today, with his son Sheria Lang.    Exit Site Care: Drive line is being maintained by VAD coordinators. Existing VAD dressing removed and site care performed using sterile technique. Drive line exit site cleaned with CHG swabs x 2 and rinsed with saline, allowed to dry, and Sorbaview dressing with silverlon patch applied. Anchor reapplied and covered with three large tegaderms.  Drive line exit site incorporated, the velour is fully implanted at exit site. No erythema, tenderness, drainage, or foul odor noted.  Pt denies fever or chills. Continue weekly dressing changes. VAD coordinators to perform weekly dressing changes.    Plan: Return to clinic in 1 week for dressing change and INR Coumadin dosing per Lauren PharmD   Carlton Adam RN VAD Coordinator  Office: [phone]  24/7 Pager: [phone]

## 2021-12-11 ENCOUNTER — Other Ambulatory Visit (HOSPITAL_COMMUNITY): Payer: Self-pay | Admitting: *Deleted

## 2021-12-11 DIAGNOSIS — Z95811 Presence of heart assist device: Secondary | ICD-10-CM

## 2021-12-11 DIAGNOSIS — Z7901 Long term (current) use of anticoagulants: Secondary | ICD-10-CM

## 2021-12-16 ENCOUNTER — Other Ambulatory Visit (HOSPITAL_COMMUNITY): Payer: 59

## 2021-12-17 ENCOUNTER — Other Ambulatory Visit (HOSPITAL_COMMUNITY): Payer: 59

## 2021-12-21 ENCOUNTER — Ambulatory Visit (HOSPITAL_COMMUNITY)
Admission: RE | Admit: 2021-12-21 | Discharge: 2021-12-21 | Disposition: A | Discharge status: Home / Self Care | Payer: 59 | Source: Ambulatory Visit | Attending: Internal Medicine | Admitting: Internal Medicine

## 2021-12-21 ENCOUNTER — Ambulatory Visit (HOSPITAL_COMMUNITY): Payer: Self-pay | Admitting: Pharmacist

## 2021-12-21 DIAGNOSIS — Z95811 Presence of heart assist device: Secondary | ICD-10-CM | POA: Insufficient documentation

## 2021-12-21 DIAGNOSIS — Z4801 Encounter for change or removal of surgical wound dressing: Secondary | ICD-10-CM | POA: Diagnosis not present

## 2021-12-21 DIAGNOSIS — Z7901 Long term (current) use of anticoagulants: Secondary | ICD-10-CM | POA: Insufficient documentation

## 2021-12-21 LAB — PROTIME-INR
INR: 1.5 — ABNORMAL HIGH (ref 0.8–1.2)
Prothrombin Time: 18.2 seconds — ABNORMAL HIGH (ref 11.4–15.2)

## 2021-12-21 NOTE — Progress Notes (Signed)
Patient presents for dressing change in VAD Clinic today, alone.  Pts is having issues with his car. He has to borrow his brother to come to clinic.   Exit Site Care: Drive line is being maintained by VAD coordinators. Existing VAD dressing removed and site care performed using sterile technique. Drive line exit site cleaned with CHG swabs x 2 and rinsed with saline, allowed to dry, and Sorbaview dressing with silverlon patch applied. Anchor reapplied and covered with three large tegaderms.  Drive line exit site incorporated, the velour is fully implanted at exit site. No erythema, tenderness, drainage, or foul odor noted.  Pt denies fever or chills. Continue weekly dressing changes. VAD coordinators to perform weekly dressing changes.    Plan: Return to clinic in 10 days for dressing change and INR Coumadin dosing per Lauren PharmD   Carlton Adam RN VAD Coordinator  Office: [phone]  24/7 Pager: [phone]

## 2021-12-25 ENCOUNTER — Other Ambulatory Visit (HOSPITAL_COMMUNITY): Payer: Self-pay | Admitting: Unknown Physician Specialty

## 2021-12-25 DIAGNOSIS — Z95811 Presence of heart assist device: Secondary | ICD-10-CM

## 2021-12-25 DIAGNOSIS — Z7901 Long term (current) use of anticoagulants: Secondary | ICD-10-CM

## 2021-12-31 ENCOUNTER — Other Ambulatory Visit (HOSPITAL_COMMUNITY): Payer: 59

## 2022-01-04 ENCOUNTER — Ambulatory Visit (HOSPITAL_COMMUNITY)
Admission: RE | Admit: 2022-01-04 | Discharge: 2022-01-04 | Disposition: A | Discharge status: Home / Self Care | Payer: 59 | Source: Ambulatory Visit | Attending: Internal Medicine | Admitting: Internal Medicine

## 2022-01-04 ENCOUNTER — Ambulatory Visit (HOSPITAL_COMMUNITY): Payer: Self-pay | Admitting: Pharmacist

## 2022-01-04 DIAGNOSIS — I50812 Chronic right heart failure: Secondary | ICD-10-CM | POA: Insufficient documentation

## 2022-01-04 DIAGNOSIS — I5042 Chronic combined systolic (congestive) and diastolic (congestive) heart failure: Secondary | ICD-10-CM

## 2022-01-04 DIAGNOSIS — T827XXA Infection and inflammatory reaction due to other cardiac and vascular devices, implants and grafts, initial encounter: Secondary | ICD-10-CM

## 2022-01-04 DIAGNOSIS — Z95811 Presence of heart assist device: Secondary | ICD-10-CM | POA: Diagnosis present

## 2022-01-04 DIAGNOSIS — Z7901 Long term (current) use of anticoagulants: Secondary | ICD-10-CM | POA: Insufficient documentation

## 2022-01-04 LAB — PROTIME-INR
INR: 2.3 — ABNORMAL HIGH (ref 0.8–1.2)
Prothrombin Time: 25.3 seconds — ABNORMAL HIGH (ref 11.4–15.2)

## 2022-01-04 MED ORDER — SILDENAFIL CITRATE 20 MG PO TABS: 20.0000 mg | ORAL_TABLET | Freq: Three times a day (TID) | ORAL | 11 refills | Status: DC

## 2022-01-04 NOTE — Progress Notes (Addendum)
Patient presents for dressing change and INR in VAD Clinic today.  Pts is having issues with his car. He has to borrow his brother to come to clinic.   Exit Site Care: Drive line is being maintained by VAD coordinators. Existing VAD dressing removed and site care performed using sterile technique. Drive line exit site cleaned with CHG swabs x 2 and rinsed with saline, allowed to dry, and Sorbaview dressing with silverlon patch applied. Anchor reapplied and covered with three large tegaderms.  Drive line exit site incorporated, the velour is fully implanted at exit site. No erythema, tenderness, drainage, or foul odor noted.  Pt denies fever or chills. Continue weekly dressing changes. VAD coordinators to perform weekly dressing changes.    Plan: Return to clinic for 2 month follow up with Dr [name] 01/13/22 days for dressing change and INR Coumadin dosing per Leotis Shames PharmD Informed patient he needs to take blood pressure medicine in the morning and Coumadin in the evening.   [name] RN VAD Coordinator  Office: [phone]  24/7 Pager: [phone]

## 2022-01-04 NOTE — Addendum Note (Signed)
Encounter addended by: Flora Lipps, RN on: 01/04/2022 3:35 PM  Actions taken: Clinical Note Signed

## 2022-01-11 ENCOUNTER — Other Ambulatory Visit (HOSPITAL_COMMUNITY): Payer: Self-pay | Admitting: *Deleted

## 2022-01-11 ENCOUNTER — Encounter (HOSPITAL_COMMUNITY): Payer: 59

## 2022-01-11 ENCOUNTER — Ambulatory Visit (HOSPITAL_COMMUNITY)
Admission: RE | Admit: 2022-01-11 | Discharge: 2022-01-11 | Disposition: A | Discharge status: Home / Self Care | Payer: 59 | Source: Ambulatory Visit | Attending: Cardiology | Admitting: Cardiology

## 2022-01-11 DIAGNOSIS — Z7901 Long term (current) use of anticoagulants: Secondary | ICD-10-CM | POA: Diagnosis not present

## 2022-01-11 DIAGNOSIS — Z95811 Presence of heart assist device: Secondary | ICD-10-CM

## 2022-01-11 DIAGNOSIS — Z4801 Encounter for change or removal of surgical wound dressing: Secondary | ICD-10-CM | POA: Insufficient documentation

## 2022-01-11 NOTE — Progress Notes (Signed)
Patient presents for dressing change in VAD Clinic today.  Pts is having issues with his car. He has to borrow his brother to come to clinic.  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 He did not have any dressing kits at home. Advised to cover with clean gauze and come to clinic today for dressing change. Site covered with clean gauze and tegaderm.    Exit Site Care: Drive line is being maintained by VAD coordinators. Existing VAD dressing removed and site care performed using sterile technique. Drive line exit site cleaned with CHG swabs x 2 and rinsed with saline, allowed to dry, and Sorbaview dressing with silverlon patch applied. Anchor reapplied and covered with two large tegaderms.  Drive line exit site incorporated, the velour is fully implanted at exit site. No erythema, tenderness, drainage, or foul odor noted.  Pt denies fever or chills. Continue weekly dressing changes. VAD coordinators to perform weekly dressing changes. Provided with 2 weekly kits to use at home in case dressing comes off again.   Plan: Return to clinic for 2 month follow up with Dr [name] 01/20/22    Alyce Pagan RN VAD Coordinator  Office: [phone]  24/7 Pager: [phone]

## 2022-01-13 ENCOUNTER — Encounter (HOSPITAL_COMMUNITY): Payer: 59

## 2022-01-13 ENCOUNTER — Other Ambulatory Visit (HOSPITAL_COMMUNITY): Payer: 59

## 2022-01-19 ENCOUNTER — Other Ambulatory Visit (HOSPITAL_COMMUNITY): Payer: Self-pay

## 2022-01-19 DIAGNOSIS — Z7901 Long term (current) use of anticoagulants: Secondary | ICD-10-CM

## 2022-01-19 DIAGNOSIS — Z95811 Presence of heart assist device: Secondary | ICD-10-CM

## 2022-01-20 ENCOUNTER — Encounter (HOSPITAL_COMMUNITY): Payer: 59

## 2022-01-20 ENCOUNTER — Other Ambulatory Visit (HOSPITAL_COMMUNITY): Payer: 59

## 2022-01-21 ENCOUNTER — Ambulatory Visit (HOSPITAL_COMMUNITY): Payer: Self-pay | Admitting: Pharmacist

## 2022-01-21 ENCOUNTER — Ambulatory Visit (HOSPITAL_COMMUNITY)
Admission: RE | Admit: 2022-01-21 | Discharge: 2022-01-21 | Disposition: A | Discharge status: Home / Self Care | Payer: 59 | Source: Ambulatory Visit | Attending: Internal Medicine | Admitting: Internal Medicine

## 2022-01-21 DIAGNOSIS — Z95811 Presence of heart assist device: Secondary | ICD-10-CM | POA: Diagnosis present

## 2022-01-21 DIAGNOSIS — Z4801 Encounter for change or removal of surgical wound dressing: Secondary | ICD-10-CM | POA: Diagnosis not present

## 2022-01-21 DIAGNOSIS — Z7901 Long term (current) use of anticoagulants: Secondary | ICD-10-CM | POA: Insufficient documentation

## 2022-01-21 LAB — PROTIME-INR
INR: 2.5 — ABNORMAL HIGH (ref 0.8–1.2)
Prothrombin Time: 26.4 seconds — ABNORMAL HIGH (ref 11.4–15.2)

## 2022-01-21 NOTE — Progress Notes (Signed)
Patient presents for dressing change and INR in VAD Clinic today.  Exit Site Care: Drive line is being maintained by VAD coordinators. Existing VAD dressing removed and site care performed using sterile technique. Drive line exit site cleaned with CHG swabs x 2 and rinsed with saline, allowed to dry, and Sorbaview dressing with silverlon patch applied. Anchor reapplied and covered with two large tegaderms. Drive line exit site incorporated, the velour is fully implanted at exit site. No erythema, tenderness, drainage, or foul odor noted.  Pt denies fever or chills. Continue weekly dressing changes. VAD coordinators to perform weekly dressing changes.    Plan: Return to clinic for 2 month follow up with Dr [name] 01/27/22 days along with dressing change and INR Coumadin dosing per Lauren PharmD  [name] RN,BSN VAD Coordinator  Office: [phone]  24/7 Pager: [phone]

## 2022-01-22 ENCOUNTER — Other Ambulatory Visit (HOSPITAL_COMMUNITY): Payer: Self-pay

## 2022-01-22 DIAGNOSIS — T827XXA Infection and inflammatory reaction due to other cardiac and vascular devices, implants and grafts, initial encounter: Secondary | ICD-10-CM

## 2022-01-22 DIAGNOSIS — I5042 Chronic combined systolic (congestive) and diastolic (congestive) heart failure: Secondary | ICD-10-CM

## 2022-01-22 DIAGNOSIS — Z7901 Long term (current) use of anticoagulants: Secondary | ICD-10-CM

## 2022-01-22 DIAGNOSIS — I50812 Chronic right heart failure: Secondary | ICD-10-CM

## 2022-01-22 DIAGNOSIS — Z95811 Presence of heart assist device: Secondary | ICD-10-CM

## 2022-01-22 MED ORDER — SILDENAFIL CITRATE 20 MG PO TABS: 20.0000 mg | ORAL_TABLET | Freq: Three times a day (TID) | ORAL | 11 refills | Status: DC

## 2022-01-25 ENCOUNTER — Other Ambulatory Visit (HOSPITAL_COMMUNITY): Payer: Self-pay | Admitting: *Deleted

## 2022-01-25 DIAGNOSIS — Z95811 Presence of heart assist device: Secondary | ICD-10-CM

## 2022-01-25 DIAGNOSIS — Z7901 Long term (current) use of anticoagulants: Secondary | ICD-10-CM

## 2022-01-27 ENCOUNTER — Ambulatory Visit (HOSPITAL_COMMUNITY): Payer: Self-pay | Admitting: Pharmacist

## 2022-01-27 ENCOUNTER — Encounter (HOSPITAL_COMMUNITY): Payer: Self-pay

## 2022-01-27 ENCOUNTER — Ambulatory Visit (HOSPITAL_COMMUNITY)
Admission: RE | Admit: 2022-01-27 | Discharge: 2022-01-27 | Disposition: A | Discharge status: Home / Self Care | Payer: 59 | Source: Ambulatory Visit | Attending: Cardiology | Admitting: Cardiology

## 2022-01-27 VITALS — BP 118/74 | HR 87 | Wt 205.4 lb

## 2022-01-27 DIAGNOSIS — I50812 Chronic right heart failure: Secondary | ICD-10-CM | POA: Diagnosis not present

## 2022-01-27 DIAGNOSIS — Z7901 Long term (current) use of anticoagulants: Secondary | ICD-10-CM | POA: Diagnosis not present

## 2022-01-27 DIAGNOSIS — Z4801 Encounter for change or removal of surgical wound dressing: Secondary | ICD-10-CM | POA: Diagnosis not present

## 2022-01-27 DIAGNOSIS — Z95811 Presence of heart assist device: Secondary | ICD-10-CM | POA: Diagnosis present

## 2022-01-27 DIAGNOSIS — I5022 Chronic systolic (congestive) heart failure: Secondary | ICD-10-CM

## 2022-01-27 DIAGNOSIS — I10 Essential (primary) hypertension: Secondary | ICD-10-CM

## 2022-01-27 DIAGNOSIS — T827XXA Infection and inflammatory reaction due to other cardiac and vascular devices, implants and grafts, initial encounter: Secondary | ICD-10-CM

## 2022-01-27 DIAGNOSIS — Z720 Tobacco use: Secondary | ICD-10-CM

## 2022-01-27 LAB — BASIC METABOLIC PANEL
Anion gap: 4 — ABNORMAL LOW (ref 5–15)
BUN: 11 mg/dL (ref 6–20)
CO2: 24 mmol/L (ref 22–32)
Calcium: 8.8 mg/dL — ABNORMAL LOW (ref 8.9–10.3)
Chloride: 110 mmol/L (ref 98–111)
Creatinine, Ser: 0.93 mg/dL (ref 0.61–1.24)
GFR, Estimated: >60 mL/min (ref >60)
Glucose, Bld: 105 mg/dL — ABNORMAL HIGH (ref 70–99)
Potassium: 3.7 mmol/L (ref 3.5–5.1)
Sodium: 138 mmol/L (ref 135–145)

## 2022-01-27 LAB — CBC
HCT: 37.1 % — ABNORMAL LOW (ref 39.0–52.0)
Hemoglobin: 12.1 g/dL — ABNORMAL LOW (ref 13.0–17.0)
MCH: 29.2 pg (ref 26.0–34.0)
MCHC: 32.6 g/dL (ref 30.0–36.0)
MCV: 89.6 fL (ref 80.0–100.0)
Platelets: 207 10*3/uL (ref 150–400)
RBC: 4.14 MIL/uL — ABNORMAL LOW (ref 4.22–5.81)
RDW: 14.1 % (ref 11.5–15.5)
WBC: 6.2 10*3/uL (ref 4.0–10.5)
nRBC: 0 % (ref 0.0–0.2)

## 2022-01-27 LAB — PROTIME-INR
INR: 2.1 — ABNORMAL HIGH (ref 0.8–1.2)
Prothrombin Time: 23.3 seconds — ABNORMAL HIGH (ref 11.4–15.2)

## 2022-01-27 LAB — LACTATE DEHYDROGENASE: LDH: 183 U/L (ref 98–192)

## 2022-01-27 NOTE — Patient Instructions (Signed)
No medication changes Coumadin dosing per Lauren PharmD Return to clinic in 1 week for INR & dressing change Return to clinic in 2 months for follow up with Dr [name]

## 2022-01-27 NOTE — Progress Notes (Addendum)
Patient presents for 2 month f/u in VAD clinic today alone. Denies any issues with VAD or equipment.   Pt states he is feeling great. He is working at [employer] and [employer].  He is loves this. 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 Denies lightheadedness, dizziness, falls, heart failure symptoms, and signs of bleeding.   Still smoking 3 - 4 cigarettes per day. He does not feel like the nicotine patch helps. He states he will continue to work on this. He is taking all medications as prescribed. BP improved since last visit with Cardura.   High # of PI events noted daily. Reports he is drinking < 1 L per day, and is sweating a lot. Advised to drink 2 L per day, and hydrate more if he will be outside working in the hot weather. He verbalized understanding.   Vital Signs: Temp:  Doppler: 101 Automatic BP: 118/74 (99) HR: 87 SPO2: UTO%   Weight: 205.4 lbs w/ eqt Last wt: 201.2 lbs w/ equip   VAD Indication: Destination Therapy    VAD interrogation & Equipment Management: Speed: 6500 Flow: 5.8 Power: 5.9 w    PI: 2.6 Hct: 44  Alarms: none Events: 100+ daily  Primary Controller:  Replace back up battery in 15 months Back up controller: did not bring  Annual Equipment Maintenance on UBC/PM was performed 09/03/20   I reviewed the LVAD parameters from today and compared the results to the patient's prior recorded data.  LVAD interrogation was NEGATIVE for significant power changes, NEGATIVE for clinical alarms and STABLE for PI events/speed drops. No programming changes were made and pump is functioning within specified parameters.   Exit Site Care: Drive line is being maintained weekly by VAD coordinators. Existing VAD dressing removed and site care performed using sterile technique. Drive line exit site cleaned with Chlora prep applicators x 2, RINSED WITH SALINE allowed to dry, and Sorbaview dressing with Silverlon patch reapplied. Anchor reapplied. Covered  with 2 large tegaderms. Drive line exit site incorporated, the velour is fully implanted at exit site. No drainage, erythema, tenderness, or foul odor noted.  Pt denies fever or chills. Continue weekly dressing changes. VAD coordinators to perform weekly dressing changes. Pt has adequate dressing supplies at home.   Device: none  BP & Labs:  Doppler 101- reflecting MAP   Hgb 12.1 - No S/S of bleeding. Specifically denies melena/BRBPR or nosebleeds.    LDH 183 - established baseline of 200-300. Denies tea-colored urine. No power elevations noted on interrogation.   Patient Instructions:  No medication changes Coumadin dosing per Lauren PharmD Return to clinic in 1 week for INR & dressing change Return to clinic in 2 months for follow up with Dr [name]  Alyce Pagan RN VAD Coordinator  Office: [phone]  24/7 Pager: [phone]

## 2022-01-28 ENCOUNTER — Other Ambulatory Visit (HOSPITAL_COMMUNITY): Payer: Self-pay | Admitting: Unknown Physician Specialty

## 2022-01-28 DIAGNOSIS — Z7901 Long term (current) use of anticoagulants: Secondary | ICD-10-CM

## 2022-01-28 DIAGNOSIS — Z95811 Presence of heart assist device: Secondary | ICD-10-CM

## 2022-01-31 ENCOUNTER — Other Ambulatory Visit (HOSPITAL_COMMUNITY): Payer: Self-pay | Admitting: Internal Medicine

## 2022-01-31 DIAGNOSIS — G629 Polyneuropathy, unspecified: Secondary | ICD-10-CM

## 2022-01-31 DIAGNOSIS — R52 Pain, unspecified: Secondary | ICD-10-CM

## 2022-02-03 ENCOUNTER — Other Ambulatory Visit (HOSPITAL_COMMUNITY): Payer: 59

## 2022-02-08 ENCOUNTER — Ambulatory Visit (HOSPITAL_COMMUNITY)
Admission: RE | Admit: 2022-02-08 | Discharge: 2022-02-08 | Disposition: A | Discharge status: Home / Self Care | Payer: 59 | Source: Ambulatory Visit | Attending: Cardiology | Admitting: Cardiology

## 2022-02-08 ENCOUNTER — Ambulatory Visit (HOSPITAL_COMMUNITY): Payer: Self-pay | Admitting: Pharmacist

## 2022-02-08 DIAGNOSIS — Z7901 Long term (current) use of anticoagulants: Secondary | ICD-10-CM

## 2022-02-08 DIAGNOSIS — Z95811 Presence of heart assist device: Secondary | ICD-10-CM

## 2022-02-08 LAB — PROTIME-INR
INR: 1.5 — ABNORMAL HIGH (ref 0.8–1.2)
Prothrombin Time: 18.2 seconds — ABNORMAL HIGH (ref 11.4–15.2)

## 2022-02-08 NOTE — Progress Notes (Signed)
Patient presents for dressing change and INR in VAD Clinic today.  Exit Site Care: Drive line is being maintained by VAD coordinators. Existing VAD dressing removed and site care performed using sterile technique. Drive line exit site cleaned with CHG swabs x 2 and rinsed with saline, allowed to dry, and Sorbaview dressing with silverlon patch applied. Anchor reapplied and covered with two large tegaderms. Drive line exit site incorporated, the velour is fully implanted at exit site. No erythema, tenderness, drainage, or foul odor noted.  Pt denies fever or chills. Continue weekly dressing changes. VAD coordinators to perform weekly dressing changes. Pt has adequate dressing supplies at home.    Plan: Return to clinic for 2 month follow up with Dr [name] 01/27/22 days along with dressing change and INR Coumadin dosing per Lauren PharmD  Emerson Monte RN [place] Coordinator  Office: [phone]  24/7 Pager: [phone]

## 2022-02-08 NOTE — Progress Notes (Addendum)
LVAD INR 

## 2022-02-10 NOTE — Progress Notes (Signed)
VAD Clinic Note    ID:  Jonathan Wood, Jonathan Wood 08/01/1970, MRN [id number]  Location: Home  Provider location: [place] [facility] Type of Visit: Established patient  PCP: [facility]  Cardiologist:  [name], MD Primary HF: Dr [name]   Chief Complaint: Heart Failure/LVAD  History of Present Illness:  Jonathan Wood is a 51 y.o. 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  Admitted 4/20 with volume overload and anemia. Diuresed with IV lasix and transitioned to torsemide 20 mg daily. GI consulted EGD with gastritis and nonbleeding duodenal ulcers, colonoscopy with 5 polyps removed.  Admitted 12/20 for DL infection with overlying cellulitis and volume overload.   Admitted 4/21 for DL infection with large subxiphoid abscess. Underwent several debridements and eventually a pec muscle flap. Wound cx + MSSA. Initially on ancef and rifabutin but expanded to cefipime then changed to IV ancef and po rifabutin. Now on oral keflex.  Readmitted 6/21 with superficial abscess and tachypnea. Superficial abscess I&D'd and packed. CT scan done and showed possible deeper fluid collection. Reviewed with TCTS who felt it was muscle flap. Sent to IR for possible drainage but u/s confirmed it was the muscle flap and no drainable fluid collection. Wound cx negative. ID saw and recommended continuing Keflex.   Admitted 8/21 with lactic acidosis due to cardiogenic shock. Started on 0.25 of milrinone and NE. Ramp echo on 8/13 with severe RV dysfunction. Speed increased 5600-> 6300. Tolerated ok. Repeat ramp echo on 8/20 speed increased to 6500. Diuresed over 40 pounds.   Admitted 10/21 with  ETOH pancreatitis. Gallbladder ok. Resolved with medical management    Patient presents for f/u visit. At last visit cardura added for HTN. Feels great. 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 Denies orthopnea or PND. No fevers, chills or problems with driveline. No bleeding, melena or neuro symptoms. No VAD alarms. Taking all meds as prescribed. Smoking ~3cigs/day     VAD Indication: Destination Therapy    VAD interrogation & Equipment Management: Speed: 6500 Flow: 5.8 Power: 5.9 w    PI: 2.6 Hct: 44   Alarms: none Events: 100+ daily   Primary Controller:  Replace back up battery in 15 months Back up controller: did not bring   Annual Equipment Maintenance on UBC/PM was performed 09/03/20    I reviewed the LVAD parameters from today and compared the results to the patient's prior recorded data.  LVAD interrogation was NEGATIVE for significant power changes, NEGATIVE for clinical alarms and STABLE for PI events/speed drops. No programming changes were made and pump is functioning within specified parameters.   Past Medical History:  Diagnosis Date   Asthma    CHF (congestive heart failure) ([place])    a. 09/2016: EF 20-25% with cath showing normal cors   GERD (gastroesophageal reflux disease)    History of hiatal hernia    LVAD (left ventricular assist device) present (HCC)    OSA on CPAP 09/06/2018   Severe OSA with AHI 68/hr on CPAP at 12cm H2O   Past Surgical History:  Procedure Laterality Date   APPLICATION OF A-CELL OF CHEST/ABDOMEN N/A 08/24/2019   Procedure: Application Of A-Cell Of Chest/Abdomen;  Surgeon: Ivin Poot, MD;  Location: [place];  Service: Thoracic;  Laterality: N/A;   APPLICATION OF A-CELL OF CHEST/ABDOMEN N/A 09/05/2019   Procedure: Application Of A-Cell Of Chest/Abdomen;  Surgeon: Peggye Form, DO;  Location[facility] OR;  Service: Plastics;  Laterality: N/A;   APPLICATION OF A-CELL OF CHEST/ABDOMEN  09/03/2019   Procedure: Application Of A-Cell Of Chest/Abdomen;  Surgeon: Kerin Perna, MD;  Location: [facility] OR;  Service: Thoracic;;   APPLICATION OF WOUND VAC N/A 08/22/2019   Procedure: Debridement, Irrigation and Packing of Abdominal  Incision.;  Surgeon: Kerin Perna, MD;  Location[facility] OR;  Service: Thoracic;  Laterality: N/A;   APPLICATION OF WOUND VAC N/A 08/24/2019   Procedure: Irrigation and Debridement with WOUND VAC APPLICATION;  Surgeon: Kerin Perna, MD;  Location: [facility] OR;  Service: Thoracic;  Laterality: N/A;   APPLICATION OF WOUND VAC N/A 08/30/2019   Procedure: APPLICATION OF WOUND VAC;  Surgeon: Kerin Perna, MD;  Location: [facility] OR;  Service: Thoracic;  Laterality: N/A;   APPLICATION OF WOUND VAC N/A 09/03/2019   Procedure: WOUND VAC CHANGE;  Surgeon: Kerin Perna, MD;  Location: [facility] OR;  Service: Thoracic;  Laterality: N/A;   BIOPSY  08/23/2018   Procedure: BIOPSY;  Surgeon: Lemar Lofty., MD;  Location: [facility] ENDOSCOPY;  Service: Gastroenterology;;   COLONOSCOPY N/A 08/23/2018   Procedure: COLONOSCOPY;  Surgeon: Lemar Lofty., MD;  Location: [facility] ENDOSCOPY;  Service: Gastroenterology;  Laterality: N/A;   ENTEROSCOPY N/A 08/23/2018   Procedure: ENTEROSCOPY;  Surgeon: Meridee Score Netty Starring., MD;  Location: [facility] ENDOSCOPY;  Service: Gastroenterology;  Laterality: N/A;   HEMOSTASIS CLIP PLACEMENT  08/23/2018   Procedure: HEMOSTASIS CLIP PLACEMENT;  Surgeon: Lemar Lofty., MD;  Location: [facility] ENDOSCOPY;  Service: Gastroenterology;;   IABP INSERTION N/A 09/05/2017   Procedure: IABP INSERTION;  Surgeon: Dolores Patty, MD;  [facility] INVAS[facility] CV LAB;  Service: Cardiovascular;  Laterality: N/A;   INSERTION OF IMPLANTABLE LEFT VENTRICULAR ASSIST DEVICE N/A 09/06/2017   Procedure: INSERTION OF IMPLANTABLE LEFT VENTRICULAR ASSIST DEVICE - HM3;  Surgeon: Kerin Perna, MD;  Location: [facility] OR;  Service: Open Heart Surgery;  Laterality: N/A;  HM3   MUSCLE FLAP CLOSURE N/A 09/05/2019   Procedure: MUSCLE FLAP CLOSURE;  Surgeon: Peggye Form, DO;  Location: [facility] OR;  Service: Plastics;  Laterality: N/A;   NASAL FRACTURE SURGERY  1987   POLYPECTOMY  08/23/2018   Procedure: POLYPECTOMY;  Surgeon:  Mansouraty, Netty Starring., MD;  Location[facility];  Service: Gastroenterology;;   RIGHT HEART CATH N/A 08/30/2017   Procedure: RIGHT HEART CATH;  Surgeon: Dolores Patty, MD;  Location: [facility] INVASIVE CV LAB;  Service: Cardiovascular;  Laterality: N/A;   RIGHT/LEFT HEART CATH AND CORONARY ANGIOGRAPHY N/A 10/08/2016   Procedure: Right/Left Heart Cath and Coronary Angiography;  Surgeon: Orpah Cobb, MD;  Location: [facility] INVASIVE CV LAB;  Service: Cardiovascular;  Laterality: N/A;   STERNAL WOUND DEBRIDEMENT N/A 08/20/2019   Procedure: DEBRIDEMENT OF LVAD DRIVELINE TUNNEL;  Surgeon: Kerin Perna, MD;  Location: [facility] OR;  Service: Thoracic;  Laterality: N/A;   STERNAL WOUND DEBRIDEMENT N/A 09/05/2019   Procedure: DEBRIDEMENT AND CLOSURE OF ABDOMINAL WOUND;  Surgeon: Peggye Form, DO;  Location: [facility] OR;  Service: Plastics;  Laterality: N/A;   SUBMUCOSAL TATTOO INJECTION  08/23/2018   Procedure: SUBMUCOSAL TATTOO INJECTION;  Surgeon: Meridee Score Netty Starring., MD;  Location: [facility] ENDOSCOPY;  Service: Gastroenterology;;   TEE WITHOUT CARDIOVERSION N/A 09/06/2017   Procedure: TRANSESOPHAGEAL ECHOCARDIOGRAM (TEE);  Surgeon: Donata Clay, Theron Arista, MD;  Location: [facility] OR;  Service: Open Heart Surgery;  Laterality: N/A;   WOUND DEBRIDEMENT N/A 08/30/2019   Procedure:  Debridement Abdominal Wound;  Surgeon: Kerin Perna, MD;  Location: [facility] OR;  Service: Thoracic;  Laterality: N/A;     Current Outpatient Medications  Medication Sig Dispense Refill   acetaminophen (TYLENOL) 500 MG tablet Take 1,500 mg by mouth 3 (three) times daily as needed for headache (pain).     cephALEXin (KEFLEX) 500 MG capsule Take 1 capsule (500 mg total) by mouth 3 (three) times daily. 90 capsule 11   doxazosin (CARDURA) 1 MG tablet Take 1 tablet (1 mg total) by mouth at bedtime. 30 tablet 5   pantoprazole (PROTONIX) 40 MG tablet Take 1 tablet (40 mg total) by mouth 2 (two) times daily. 180 tablet 3   sacubitril-valsartan (ENTRESTO)  97-103 MG Take 1 tablet by mouth 2 (two) times daily. 60 tablet 6   sildenafil (REVATIO) 20 MG tablet Take 1 tablet (20 mg total) by mouth 3 (three) times daily. 90 tablet 11   spironolactone (ALDACTONE) 25 MG tablet Take 0.5 tablets (12.5 mg total) by mouth daily. 30 tablet 6   warfarin (COUMADIN) 5 MG tablet Take 5 mg (1 tab) every Tuesday/Thursday/Saturday and 7.5 mg (1.5 tab) all other days OR as directed by HF clinic 180 tablet 6   albuterol (PROAIR HFA) 108 (90 Base) MCG/ACT inhaler Inhale 2 puffs into the lungs every 6 (six) hours as needed for wheezing or shortness of breath. (Patient not taking: Reported on 11/11/2021) 1 each 6   enoxaparin (LOVENOX) 40 MG/0.4ML injection Inject 0.4 mLs (40 mg total) into the skin every 12 (twelve) hours. (Patient not taking: Reported on 01/27/2022) 4 mL 0   gabapentin (NEURONTIN) 300 MG capsule TAKE ONE CAPSULE BY MOUTH THREE TIMES DAILY AT 9am, 3pm, AND 9pm 90 capsule 5   nicotine (NICODERM CQ - DOSED IN MG/24 HOURS) 21 mg/24hr patch Place 1 patch (21 mg total) onto the skin daily. (Patient not taking: Reported on 04/29/2021) 28 patch 3   torsemide (DEMADEX) 20 MG tablet Take 1 tablet (20 mg total) by mouth every other day. (Patient not taking: Reported on 06/26/2020) 90 tablet 3   traMADol HCl 100 MG TABS Take 100 mg by mouth every 8 (eight) hours as needed. (Patient not taking: Reported on 09/07/2021) 10 tablet 0   No current facility-administered medications for this encounter.    Allergies:   Patient has no known allergies.   Social History:  The patient  reports that he has quit smoking. His smoking use included cigarettes. He has a 12.50 pack-year smoking history. He has never used smokeless tobacco. He reports that he does not currently use alcohol. He reports that he does not currently use drugs after having used the following drugs: Marijuana.   Family History:  The patient's family history includes Alcohol abuse in his cousin; Hypertension in his  father.   ROS:  Please see the history of present illness.   All other systems are personally reviewed and negative.     Vital Signs: Temp:  Doppler: 101 Automatic BP: 118/74 (99) HR: 87 SPO2: UTO%   Weight: 205.4 lbs w/ eqt Last wt: 201.2 lbs w/ equip   Exam: General:  NAD.  HEENT: normal  Neck: supple. JVP not elevated.  Carotids 2+ bilat; no bruits. No lymphadenopathy or thryomegaly appreciated. Cor: LVAD hum.  Lungs: Clear. Abdomen:soft, nontender, non-distended. No hepatosplenomegaly. No bruits or masses. Good bowel sounds. Driveline site clean. Anchor in place.  Extremities: no cyanosis, clubbing, rash. Warm no edema  Neuro: alert & oriented x 3. No  focal deficits. Moves all 4 without problem    Recent Labs: 09/07/2021: ALT 31 01/27/2022: BUN 11; Creatinine, Ser 0.93; Hemoglobin 12.1; Platelets 207; Potassium 3.7; Sodium 138    Wt Readings from Last 3 Encounters:  01/27/22 93.2 kg (205 lb 6.4 oz)  11/11/21 95.3 kg (210 lb 3.2 oz)  09/07/21 92.7 kg (204 lb 6.4 oz)      ASSESSMENT AND PLAN:   1. 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 Treated with milrinone and then weaned off.  Diuresed 40 pounds. VAD speed increased 5600 -> 6300 - He continues to do very well. NYHA I - Volume status looks good - Continue Entresto and spiro.  - We have recently been discussing transplant. He is interested but needs to stop smoking and have 6 months of negative cotinine tests. We again encouraged him to work on smoking cessation. Discussed again today.   2. HM3 LVAD 09/06/2017.  - VAD interrogated personally. Parameters stable. - LDH 183 - INR 2.1 goal 2.0-2.5  Discussed dosing with PharmD personally. - Off ASA with PUD.  - hgb 10.5 -> 10.8 -> 12.1 - DL site looks good  3.  Essential HTN - MAPs improved with cardura. Can increase as needed  4. H/o ETOH pancreatitis,  11/21 - remains quit from drinking (was stressed about his son)  5. Recurrent DL infection with subxiphoid abscess - s/p debridement and pec flap in 4/21 - remains on keflex. Site continues to look good - He is not a candidate for pump exchange given severe RV dysfunction   6.  OSA - sleep study with very severe OSA (AHI 69/hr) - He has refused therapy multiple times but seems to have improved with weight loss and ETOH cessation. We have offered repeat testing to see if OSA has improved but he refuses.  - No change  7.  H/o GI bleed  - 08/2018 EGD showed gastritis and nonbleeding duodenal ulcers, colonoscopy with 5 polyps removed.  - Remains on PPI. No recent GI bleeding - hgb improved  8. Tobacco Abuse  - continues to smoke a few cigs/day. No change - we discussed again that he needs to stop smoking to qualify for transplant.  - no change  Total time spent 35 minutes. Over half that time spent discussing above.   Signed, Arvilla Meres, MD  02/10/2022 7:06 PM  [facility] [address] [facility] [place] [address] [phone] (office) [phone] (fax)

## 2022-02-11 ENCOUNTER — Other Ambulatory Visit (HOSPITAL_COMMUNITY): Payer: Self-pay | Admitting: *Deleted

## 2022-02-11 DIAGNOSIS — Z95811 Presence of heart assist device: Secondary | ICD-10-CM

## 2022-02-11 DIAGNOSIS — Z7901 Long term (current) use of anticoagulants: Secondary | ICD-10-CM

## 2022-02-17 ENCOUNTER — Other Ambulatory Visit (HOSPITAL_COMMUNITY): Payer: 59

## 2022-02-18 ENCOUNTER — Ambulatory Visit (HOSPITAL_COMMUNITY)
Admission: RE | Admit: 2022-02-18 | Discharge: 2022-02-18 | Disposition: A | Discharge status: Home / Self Care | Payer: 59 | Source: Ambulatory Visit | Attending: Internal Medicine | Admitting: Internal Medicine

## 2022-02-18 DIAGNOSIS — Z95811 Presence of heart assist device: Secondary | ICD-10-CM | POA: Diagnosis present

## 2022-02-18 NOTE — Progress Notes (Signed)
Patient presents for dressing change in VAD Clinic today.  Exit Site Care: Drive line is being maintained by VAD coordinators. Existing VAD dressing removed and site care performed using sterile technique. Drive line exit site cleaned with CHG swabs x 2 and rinsed with saline, allowed to dry, and Sorbaview dressing with silverlon patch applied. Anchor reapplied and covered with two large tegaderms. Drive line exit site incorporated, the velour is fully implanted at exit site. No erythema, tenderness, drainage, or foul odor noted.  Pt denies fever or chills. Continue weekly dressing changes. VAD coordinators to perform weekly dressing changes. Pt provided with 4 weekly kits for home use.   Plan: Return to clinic in 1 week for dressing change and INR   Emerson Monte RN [place] Coordinator  Office: [phone]  24/7 Pager: [phone]

## 2022-02-19 ENCOUNTER — Other Ambulatory Visit (HOSPITAL_COMMUNITY): Payer: Self-pay | Admitting: *Deleted

## 2022-02-19 DIAGNOSIS — Z7901 Long term (current) use of anticoagulants: Secondary | ICD-10-CM

## 2022-02-19 DIAGNOSIS — Z95811 Presence of heart assist device: Secondary | ICD-10-CM

## 2022-02-19 NOTE — Addendum Note (Signed)
Addended by: Emerson Monte B on: 02/19/2022 12:41 PM   Modules accepted: Orders

## 2022-02-24 ENCOUNTER — Other Ambulatory Visit (HOSPITAL_COMMUNITY): Payer: 59

## 2022-03-03 ENCOUNTER — Ambulatory Visit (HOSPITAL_COMMUNITY): Payer: Self-pay | Admitting: Pharmacist

## 2022-03-03 ENCOUNTER — Ambulatory Visit (HOSPITAL_COMMUNITY)
Admission: RE | Admit: 2022-03-03 | Discharge: 2022-03-03 | Disposition: A | Discharge status: Home / Self Care | Payer: 59 | Source: Ambulatory Visit | Attending: Internal Medicine | Admitting: Internal Medicine

## 2022-03-03 DIAGNOSIS — Z7901 Long term (current) use of anticoagulants: Secondary | ICD-10-CM | POA: Insufficient documentation

## 2022-03-03 DIAGNOSIS — Z95811 Presence of heart assist device: Secondary | ICD-10-CM | POA: Diagnosis present

## 2022-03-03 LAB — PROTIME-INR
INR: 1.5 — ABNORMAL HIGH (ref 0.8–1.2)
Prothrombin Time: 18.2 seconds — ABNORMAL HIGH (ref 11.4–15.2)

## 2022-03-03 NOTE — Progress Notes (Signed)
Patient presents for dressing change in VAD Clinic today.  Requested to have BP checked today: 122/87 (106). Reports occasional dizziness when he is working. States he is drinking less than 2 L per day. Encouraged to drink at least 2 L daily. He verbalized understanding.   Exit Site Care: Drive line is being maintained by VAD coordinators. Existing VAD dressing removed and site care performed using sterile technique. Drive line exit site cleaned with CHG swabs x 2 and rinsed with saline, allowed to dry, and Sorbaview dressing with silverlon patch applied. Anchor reapplied and covered with two large tegaderms. Drive line exit site incorporated, the velour is fully implanted at exit site. No erythema, tenderness, drainage, or foul odor noted.  Pt denies fever or chills. Continue weekly dressing changes. VAD coordinators to perform weekly dressing changes. Pt provided with 1 box large tegaderms for home use.    Plan: Return to clinic in 1 week for dressing change  Coumadin dosing per Lauren PharmD   Emerson Monte RN [place] Coordinator  Office: [phone]  24/7 Pager: [phone]

## 2022-03-05 ENCOUNTER — Other Ambulatory Visit (HOSPITAL_COMMUNITY): Payer: Self-pay | Admitting: *Deleted

## 2022-03-05 DIAGNOSIS — Z95811 Presence of heart assist device: Secondary | ICD-10-CM

## 2022-03-05 DIAGNOSIS — Z7901 Long term (current) use of anticoagulants: Secondary | ICD-10-CM

## 2022-03-10 ENCOUNTER — Other Ambulatory Visit (HOSPITAL_COMMUNITY): Payer: 59

## 2022-03-11 ENCOUNTER — Ambulatory Visit (HOSPITAL_COMMUNITY)
Admission: RE | Admit: 2022-03-11 | Discharge: 2022-03-11 | Disposition: A | Discharge status: Home / Self Care | Payer: 59 | Source: Ambulatory Visit | Attending: Cardiology | Admitting: Cardiology

## 2022-03-11 ENCOUNTER — Ambulatory Visit (HOSPITAL_COMMUNITY): Payer: Self-pay | Admitting: Pharmacist

## 2022-03-11 DIAGNOSIS — Z95811 Presence of heart assist device: Secondary | ICD-10-CM | POA: Diagnosis not present

## 2022-03-11 DIAGNOSIS — Z7901 Long term (current) use of anticoagulants: Secondary | ICD-10-CM | POA: Insufficient documentation

## 2022-03-11 LAB — PROTIME-INR
INR: 1.3 — ABNORMAL HIGH (ref 0.8–1.2)
Prothrombin Time: 16.3 seconds — ABNORMAL HIGH (ref 11.4–15.2)

## 2022-03-11 NOTE — Addendum Note (Signed)
Encounter addended by: Christinia Gully, RN on: 03/11/2022 3:02 PM  Actions taken: Clinical Note Signed

## 2022-03-11 NOTE — Progress Notes (Signed)
LVAD INR 

## 2022-03-11 NOTE — Progress Notes (Signed)
Patient presents for dressing change in VAD Clinic today.   Exit Site Care: Drive line is being maintained by VAD coordinators. Existing VAD dressing removed and site care performed using sterile technique. Drive line exit site cleaned with CHG swabs x 2 and rinsed with saline, allowed to dry, and Sorbaview dressing with silverlon patch applied. Anchor reapplied and covered with two large tegaderms. Drive line exit site incorporated, the velour is fully implanted at exit site. No erythema, tenderness, drainage, or foul odor noted.  Pt denies fever or chills. Continue weekly dressing changes. VAD coordinators to perform weekly dressing changes.    Plan: Return to clinic in 1 week for dressing change  Coumadin dosing per [place] RN [place] Coordinator  Office: [phone]  24/7 Pager: [phone]

## 2022-03-12 ENCOUNTER — Other Ambulatory Visit (HOSPITAL_COMMUNITY): Payer: Self-pay

## 2022-03-12 DIAGNOSIS — Z7901 Long term (current) use of anticoagulants: Secondary | ICD-10-CM

## 2022-03-12 DIAGNOSIS — Z95811 Presence of heart assist device: Secondary | ICD-10-CM

## 2022-03-17 ENCOUNTER — Other Ambulatory Visit (HOSPITAL_COMMUNITY): Payer: 59

## 2022-03-19 ENCOUNTER — Other Ambulatory Visit (HOSPITAL_COMMUNITY): Payer: 59

## 2022-03-22 ENCOUNTER — Ambulatory Visit (HOSPITAL_COMMUNITY)
Admission: RE | Admit: 2022-03-22 | Discharge: 2022-03-22 | Disposition: A | Discharge status: Home / Self Care | Payer: 59 | Source: Ambulatory Visit | Attending: Cardiology | Admitting: Cardiology

## 2022-03-22 ENCOUNTER — Ambulatory Visit (HOSPITAL_COMMUNITY): Payer: Self-pay | Admitting: Pharmacist

## 2022-03-22 DIAGNOSIS — Z4801 Encounter for change or removal of surgical wound dressing: Secondary | ICD-10-CM | POA: Diagnosis present

## 2022-03-22 DIAGNOSIS — Z7901 Long term (current) use of anticoagulants: Secondary | ICD-10-CM | POA: Diagnosis not present

## 2022-03-22 DIAGNOSIS — I5022 Chronic systolic (congestive) heart failure: Secondary | ICD-10-CM

## 2022-03-22 DIAGNOSIS — Z95828 Presence of other vascular implants and grafts: Secondary | ICD-10-CM | POA: Insufficient documentation

## 2022-03-22 DIAGNOSIS — Z95811 Presence of heart assist device: Secondary | ICD-10-CM

## 2022-03-22 LAB — PROTIME-INR
INR: 1.5 — ABNORMAL HIGH (ref 0.8–1.2)
Prothrombin Time: 17.8 seconds — ABNORMAL HIGH (ref 11.4–15.2)

## 2022-03-22 NOTE — Progress Notes (Addendum)
Patient presents for dressing change in VAD Clinic today.   Exit Site Care: Drive line is being maintained by VAD coordinators. Existing VAD dressing removed and site care performed using sterile technique. Drive line exit site cleaned with CHG swabs x 2 and rinsed with saline, allowed to dry, and Sorbaview dressing with silverlon patch applied. Anchor reapplied and covered with two large tegaderms. Drive line exit site incorporated, the velour is fully implanted at exit site. No erythema, tenderness, drainage, or foul odor noted.  Pt denies fever or chills. Continue weekly dressing changes. VAD coordinators to perform weekly dressing changes. Patient provided with 4 weekly dressing kits. Asked to bring one to each clinic visit.    Plan: Return to clinic in 1 week for dressing change  Coumadin dosing per [place] RN,BSN [place] Coordinator  Office: [phone]  24/7 Pager: [phone]

## 2022-03-22 NOTE — Addendum Note (Signed)
Encounter addended by: Doren Custard, RN on: 03/22/2022 10:47 AM  Actions taken: Clinical Note Signed

## 2022-03-22 NOTE — Addendum Note (Signed)
Encounter addended by: Doren Custard, RN on: 03/22/2022 10:48 AM  Actions taken: Clinical Note Signed

## 2022-03-22 NOTE — Addendum Note (Signed)
Encounter addended by: Harvie Junior, CMA on: 03/22/2022 10:32 AM  Actions taken: Visit diagnoses modified, Order list changed, Diagnosis association updated

## 2022-03-25 ENCOUNTER — Other Ambulatory Visit (HOSPITAL_COMMUNITY): Payer: Self-pay | Admitting: *Deleted

## 2022-03-25 DIAGNOSIS — Z7901 Long term (current) use of anticoagulants: Secondary | ICD-10-CM

## 2022-03-25 DIAGNOSIS — Z95811 Presence of heart assist device: Secondary | ICD-10-CM

## 2022-03-29 ENCOUNTER — Other Ambulatory Visit (HOSPITAL_COMMUNITY): Payer: 59

## 2022-03-30 ENCOUNTER — Ambulatory Visit (HOSPITAL_COMMUNITY)
Admission: RE | Admit: 2022-03-30 | Discharge: 2022-03-30 | Disposition: A | Discharge status: Home / Self Care | Payer: 59 | Source: Ambulatory Visit | Attending: Cardiology | Admitting: Cardiology

## 2022-03-30 ENCOUNTER — Ambulatory Visit (HOSPITAL_COMMUNITY): Payer: Self-pay | Admitting: Pharmacist

## 2022-03-30 DIAGNOSIS — Z95811 Presence of heart assist device: Secondary | ICD-10-CM | POA: Diagnosis not present

## 2022-03-30 DIAGNOSIS — Z4800 Encounter for change or removal of nonsurgical wound dressing: Secondary | ICD-10-CM | POA: Insufficient documentation

## 2022-03-30 DIAGNOSIS — Z7901 Long term (current) use of anticoagulants: Secondary | ICD-10-CM | POA: Insufficient documentation

## 2022-03-30 LAB — PROTIME-INR
INR: 1.5 — ABNORMAL HIGH (ref 0.8–1.2)
Prothrombin Time: 18.3 seconds — ABNORMAL HIGH (ref 11.4–15.2)

## 2022-03-30 NOTE — Addendum Note (Signed)
Encounter addended by: Lebron Quam, RN on: 03/30/2022 11:20 AM  Actions taken: Clinical Note Signed

## 2022-03-30 NOTE — Progress Notes (Addendum)
Patient presents for dressing change in VAD Clinic today.  Pt tells me he will be going to [place], [place] for thanksgiving and needs the closest VAD center with 24/7 number. These numbers were provided to the pt.  [facility] [phone] 4.13 miles [address] [place], [place], [place], [address] [facility] [phone] 4.80 miles [address] [place], [place], [place], [address] [place]  Exit Site Care: Drive line is being maintained by VAD coordinators. Existing VAD dressing removed and site care performed using sterile technique. Drive line exit site cleaned with CHG swabs x 2 and rinsed with saline, allowed to dry, and Sorbaview dressing with silverlon patch applied. Anchor reapplied and covered with two large tegaderms. Drive line exit site incorporated, the velour is fully implanted at exit site. No erythema, tenderness, drainage, or foul odor noted.  Pt denies fever or chills. Continue weekly dressing changes. VAD coordinators to perform weekly dressing changes. Patient has sufficient kits at home.  Plan: Return to clinic in 1 week for dressing change  Coumadin dosing per Lauren PharmD  Carlton Adam RN,BSN VAD Coordinator  Office: [phone]  24/7 Pager: [phone]

## 2022-03-31 ENCOUNTER — Other Ambulatory Visit (HOSPITAL_COMMUNITY): Payer: 59

## 2022-04-02 ENCOUNTER — Other Ambulatory Visit (HOSPITAL_COMMUNITY): Payer: Self-pay | Admitting: Unknown Physician Specialty

## 2022-04-02 DIAGNOSIS — Z95811 Presence of heart assist device: Secondary | ICD-10-CM

## 2022-04-02 DIAGNOSIS — Z7901 Long term (current) use of anticoagulants: Secondary | ICD-10-CM

## 2022-04-05 ENCOUNTER — Other Ambulatory Visit (HOSPITAL_COMMUNITY): Payer: Self-pay | Admitting: Unknown Physician Specialty

## 2022-04-05 ENCOUNTER — Ambulatory Visit (HOSPITAL_COMMUNITY)
Admission: RE | Admit: 2022-04-05 | Discharge: 2022-04-05 | Disposition: A | Discharge status: Home / Self Care | Payer: 59 | Source: Ambulatory Visit | Attending: Internal Medicine | Admitting: Internal Medicine

## 2022-04-05 ENCOUNTER — Ambulatory Visit (HOSPITAL_COMMUNITY): Payer: Self-pay | Admitting: Pharmacist

## 2022-04-05 DIAGNOSIS — Z7901 Long term (current) use of anticoagulants: Secondary | ICD-10-CM

## 2022-04-05 DIAGNOSIS — Z95828 Presence of other vascular implants and grafts: Secondary | ICD-10-CM | POA: Insufficient documentation

## 2022-04-05 DIAGNOSIS — Z95811 Presence of heart assist device: Secondary | ICD-10-CM

## 2022-04-05 DIAGNOSIS — Z4801 Encounter for change or removal of surgical wound dressing: Secondary | ICD-10-CM | POA: Diagnosis present

## 2022-04-05 LAB — PROTIME-INR
INR: 1.9 — ABNORMAL HIGH (ref 0.8–1.2)
Prothrombin Time: 21.5 seconds — ABNORMAL HIGH (ref 11.4–15.2)

## 2022-04-05 NOTE — Progress Notes (Signed)
Patient presents for dressing change in VAD Clinic today.  Pt tells me he will be going to [place], [place] for thanksgiving and needs the closest VAD center with 24/7 number. These numbers were provided to the pt.  [facility] [phone] 4.13 miles [address], [place], [place], [address] [place]  [facility] [phone] 4.80 miles [address], [place], [place], [address] [place]  Exit Site Care: Drive line is being maintained by VAD coordinators. Existing VAD dressing removed and site care performed using sterile technique. Drive line exit site cleaned with CHG swabs x 2 and rinsed with saline, allowed to dry, and Sorbaview dressing with silverlon patch applied. Anchor reapplied and covered with two large tegaderms. Drive line exit site incorporated, the velour is fully implanted at exit site. No erythema, tenderness, drainage, or foul odor noted.  Pt denies fever or chills. Continue weekly dressing changes. VAD coordinators to perform weekly dressing changes. Patient has sufficient kits at home.  Plan: Return to clinic in 1 week for dressing change  Coumadin dosing per Lauren PharmD  Mollee Neer RN,BSN VAD Coordinator  Office: [phone]  24/7 Pager: [phone]  

## 2022-04-06 ENCOUNTER — Other Ambulatory Visit (HOSPITAL_COMMUNITY): Payer: 59

## 2022-04-07 ENCOUNTER — Encounter (HOSPITAL_COMMUNITY): Payer: 59

## 2022-04-13 ENCOUNTER — Other Ambulatory Visit (HOSPITAL_COMMUNITY): Payer: Self-pay

## 2022-04-13 DIAGNOSIS — Z95811 Presence of heart assist device: Secondary | ICD-10-CM

## 2022-04-13 DIAGNOSIS — Z7901 Long term (current) use of anticoagulants: Secondary | ICD-10-CM

## 2022-04-14 ENCOUNTER — Encounter (HOSPITAL_COMMUNITY): Payer: 59

## 2022-04-20 ENCOUNTER — Other Ambulatory Visit (HOSPITAL_COMMUNITY): Payer: Self-pay | Admitting: Unknown Physician Specialty

## 2022-04-20 DIAGNOSIS — Z7901 Long term (current) use of anticoagulants: Secondary | ICD-10-CM

## 2022-04-20 DIAGNOSIS — Z95811 Presence of heart assist device: Secondary | ICD-10-CM

## 2022-04-21 ENCOUNTER — Ambulatory Visit (HOSPITAL_COMMUNITY)
Admission: RE | Admit: 2022-04-21 | Discharge: 2022-04-21 | Disposition: A | Discharge status: Home / Self Care | Payer: 59 | Source: Ambulatory Visit | Attending: Internal Medicine | Admitting: Internal Medicine

## 2022-04-21 ENCOUNTER — Ambulatory Visit (HOSPITAL_COMMUNITY): Payer: Self-pay | Admitting: Pharmacist

## 2022-04-21 ENCOUNTER — Encounter (HOSPITAL_COMMUNITY): Payer: Self-pay

## 2022-04-21 DIAGNOSIS — Z95811 Presence of heart assist device: Secondary | ICD-10-CM | POA: Insufficient documentation

## 2022-04-21 DIAGNOSIS — F17200 Nicotine dependence, unspecified, uncomplicated: Secondary | ICD-10-CM | POA: Diagnosis not present

## 2022-04-21 DIAGNOSIS — Z8711 Personal history of peptic ulcer disease: Secondary | ICD-10-CM | POA: Diagnosis not present

## 2022-04-21 DIAGNOSIS — I50812 Chronic right heart failure: Secondary | ICD-10-CM | POA: Diagnosis not present

## 2022-04-21 DIAGNOSIS — I5042 Chronic combined systolic (congestive) and diastolic (congestive) heart failure: Secondary | ICD-10-CM

## 2022-04-21 DIAGNOSIS — I5022 Chronic systolic (congestive) heart failure: Secondary | ICD-10-CM | POA: Diagnosis present

## 2022-04-21 DIAGNOSIS — G4733 Obstructive sleep apnea (adult) (pediatric): Secondary | ICD-10-CM | POA: Insufficient documentation

## 2022-04-21 DIAGNOSIS — Z7901 Long term (current) use of anticoagulants: Secondary | ICD-10-CM | POA: Insufficient documentation

## 2022-04-21 DIAGNOSIS — Z8719 Personal history of other diseases of the digestive system: Secondary | ICD-10-CM | POA: Insufficient documentation

## 2022-04-21 DIAGNOSIS — T827XXA Infection and inflammatory reaction due to other cardiac and vascular devices, implants and grafts, initial encounter: Secondary | ICD-10-CM

## 2022-04-21 DIAGNOSIS — I110 Hypertensive heart disease with heart failure: Secondary | ICD-10-CM | POA: Diagnosis not present

## 2022-04-21 DIAGNOSIS — Z79899 Other long term (current) drug therapy: Secondary | ICD-10-CM | POA: Insufficient documentation

## 2022-04-21 LAB — COMPREHENSIVE METABOLIC PANEL
ALT: 13 U/L (ref 0–44)
AST: 21 U/L (ref 15–41)
Albumin: 3.8 g/dL (ref 3.5–5.0)
Alkaline Phosphatase: 61 U/L (ref 38–126)
Anion gap: 8 (ref 5–15)
BUN: 12 mg/dL (ref 6–20)
CO2: 19 mmol/L — ABNORMAL LOW (ref 22–32)
Calcium: 8.6 mg/dL — ABNORMAL LOW (ref 8.9–10.3)
Chloride: 107 mmol/L (ref 98–111)
Creatinine, Ser: 1.03 mg/dL (ref 0.61–1.24)
GFR, Estimated: >60 mL/min (ref >60)
Glucose, Bld: 189 mg/dL — ABNORMAL HIGH (ref 70–99)
Potassium: 3.5 mmol/L (ref 3.5–5.1)
Sodium: 134 mmol/L — ABNORMAL LOW (ref 135–145)
Total Bilirubin: 0.6 mg/dL (ref 0.3–1.2)
Total Protein: 7.1 g/dL (ref 6.5–8.1)

## 2022-04-21 LAB — CBC
HCT: 39.4 % (ref 39.0–52.0)
Hemoglobin: 12.2 g/dL — ABNORMAL LOW (ref 13.0–17.0)
MCH: 28.8 pg (ref 26.0–34.0)
MCHC: 31 g/dL (ref 30.0–36.0)
MCV: 92.9 fL (ref 80.0–100.0)
Platelets: 174 10*3/uL (ref 150–400)
RBC: 4.24 MIL/uL (ref 4.22–5.81)
RDW: 13.2 % (ref 11.5–15.5)
WBC: 9.7 10*3/uL (ref 4.0–10.5)
nRBC: 0 % (ref 0.0–0.2)

## 2022-04-21 LAB — PREALBUMIN: Prealbumin: 18 mg/dL (ref 18–38)

## 2022-04-21 LAB — PROTIME-INR
INR: 1.9 — ABNORMAL HIGH (ref 0.8–1.2)
Prothrombin Time: 21.2 seconds — ABNORMAL HIGH (ref 11.4–15.2)

## 2022-04-21 LAB — LACTATE DEHYDROGENASE: LDH: 183 U/L (ref 98–192)

## 2022-04-21 MED ORDER — WARFARIN SODIUM 5 MG PO TABS: 7.5000 mg | ORAL_TABLET | Freq: Every day | ORAL | 5 refills | Status: DC

## 2022-04-21 MED ORDER — SILDENAFIL CITRATE 20 MG PO TABS: 20.0000 mg | ORAL_TABLET | Freq: Three times a day (TID) | ORAL | 11 refills | Status: DC

## 2022-04-21 NOTE — Progress Notes (Signed)
Patient presents for 2 month f/u with 4.5 yr Intermacs in VAD clinic today alone. Denies any issues with VAD or equipment.   Pt states he is feeling great. 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 He loves this. 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 Denies lightheadedness, dizziness, falls, heart failure symptoms, and signs of bleeding.   Still smoking 3 - 4 cigarettes per day. He does not feel like the nicotine patch helps. He states he will continue to work on this. He is taking all medications as prescribed. BP elevated this morning; has not taken medications yet today.   Pt presented to clinic reporting alarming while on MPU x 3 days. Alarming able to replicated in clinic. Sounds like red heart hazard alarm, but no message on controller screen or red heart illuminating on controller. No alarming noted on batteries.  Connected pt to patient cable/power module. Alarming able to be replicated, but no alarm banner on system monitor, no red heart illuminated on controller, and not registering alarms on interrogation file. Modular cable appears to have a slight bend in a few areas. Alarming able to be replicated moving white lead and modular cable.  Exchanged controller and modular cable in clinic. Controller: [id number] Exp- 12/16/23. Battery: [id number] Exp: 12/16/23. Modular Cable: Lot #: [id number] Exp: 03/19/24. No alarming noted since exchange. Pt instructed to call VAD coordinator immediately if alarming reoccurs. Log files sent to [employer]. Will return controller and modular cable to [employer] for expedited analysis.   Report from [employer]: The Event Log File contained history from 04/20/2022 17:10:41  -  04/21/2022 9:41:34. There were no unusual alarms recorded during this history.  You noted that the [employer] were exchanged. Please provide the Serial Numbers for the parts that were involved in  the reported alarms.  Please have the patient continue to monitor for unusual alarms and we can follow up with Log Files as needed.  Case# [id number] has been generated for this evaluation.  [id number] was created for this event.  Event Log  Patient ID SW  LVAD S/N [id number]  Controller SW 1.5.0  Date range 04/20/2022 04/21/2022  Pump Speed 6500  Low Speed Limit 6000  Avg Pump Speed 6250  Motor power max 6.1  Motor power min 5.0  Motor power avg 5.6  Flow max 6.2  Flow min 4.5  Flow avg 5.5  PI max 5.4  PI min 1.6  PI avg 3.2  HCT% 44    Vital Signs: Temp:  Doppler: 100 Automatic BP: 118/101 (108) HR: 90 SPO2: 100%   Weight: 204.8 lbs w/ eqt Last wt: 205.4 lbs w/ equip   VAD Indication: Destination Therapy    VAD interrogation & Equipment Management: Speed: 6500 Flow: 5.7 Power: 5.6 w    PI: 2.8 Hct: 44  Alarms: none Events: 100+ daily  Primary Controller:  Replaced. Replace back up battery in  months Back up controller: did not bring  Annual Equipment Maintenance on UBC/PM was performed 09/03/20   I reviewed the LVAD parameters from today and compared the results to the patient's prior recorded data.  LVAD interrogation was NEGATIVE for significant power changes, NEGATIVE for clinical alarms and STABLE for PI events/speed drops. No programming changes were made and pump is functioning within specified parameters.   Exit Site Care: Drive line is being maintained weekly by VAD coordinators. Existing VAD dressing removed and  site care performed using sterile technique. Drive line exit site cleaned with Chlora prep applicators x 2, RINSED WITH SALINE allowed to dry, and Sorbaview dressing with Silverlon patch reapplied. Anchor reapplied. Covered with 2 large tegaderms. Drive line exit site incorporated, the velour is fully implanted at exit site. No drainage, erythema, tenderness, or foul odor noted.  Pt denies fever or chills. Continue weekly dressing changes. VAD  coordinators to perform weekly dressing changes. Pt has adequate dressing supplies at home.   Device: none  BP & Labs:  Doppler 100- reflecting MAP   Hgb 12.2 - No S/S of bleeding. Specifically denies melena/BRBPR or nosebleeds.    LDH 183 - established baseline of 200-300. Denies tea-colored urine. No power elevations noted on interrogation.  4.5 year Intermacs follow up completed including:  Quality of Life, KCCQ-12, and Neurocognitive trail making.   Pt completed 1550 feet during 6 minute walk.  Back up controller: Not present today  Patient Goals: To continue working and coaching. Plans to quit smoking.  Patient Instructions:  No medication changes today Coumadin dosing per Lauren PharmD Return to VAD clinic in 1 week for dressing change Return to VAD clinic in 2 months for follow up with Dr [name] If you have recurrent alarming page VAD coordinator   Alyce Pagan RN VAD Coordinator  Office: [phone]  24/7 Pager: [phone]

## 2022-04-21 NOTE — Progress Notes (Signed)
VAD Clinic Note    ID:  Jonathan, Wood 05-13-1971, MRN [id number]  Location: Home  Provider location: [facility] Type of Visit: Established patient  PCP: [facility]  Cardiologist:  [name], MD Primary HF: Dr [name]   Chief Complaint: Heart Failure/LVAD  History of Present Illness:  Jonathan Wood is a 51 y.o. 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  Admitted 4/20 with volume overload and anemia. Diuresed with IV lasix and transitioned to torsemide 20 mg daily. GI consulted EGD with gastritis and nonbleeding duodenal ulcers, colonoscopy with 5 polyps removed.  Admitted 12/20 for DL infection with overlying cellulitis and volume overload.   Admitted 4/21 for DL infection with large subxiphoid abscess. Underwent several debridements and eventually a pec muscle flap. Wound cx + MSSA. Initially on ancef and rifabutin but expanded to cefipime then changed to IV ancef and po rifabutin. Now on oral keflex.  Readmitted 6/21 with superficial abscess and tachypnea. Superficial abscess I&D'd and packed. CT scan done and showed possible deeper fluid collection. Reviewed with TCTS who felt it was muscle flap. Sent to IR for possible drainage but u/s confirmed it was the muscle flap and no drainable fluid collection. Wound cx negative. ID saw and recommended continuing Keflex.   Admitted 8/21 with lactic acidosis due to cardiogenic shock. Started on 0.25 of milrinone and NE. Ramp echo on 8/13 with severe RV dysfunction. Speed increased 5600-> 6300. Tolerated ok. Repeat ramp echo on 8/20 speed increased to 6500. Diuresed over 40 pounds.   Admitted 10/21 with  ETOH pancreatitis. Gallbladder ok. Resolved with medical management    Here for acute visit due to VAD alarms. Has had red heart alarms on MPU x 3 days. Modular cable appears to have a slight  bend in a few areas. Exchanged controller and modular cable in clinic.Doing well. Coaching football and basketball. Denies orthopnea or PND. No fevers, chills or problems with driveline. No bleeding, melena or neuro symptoms. No VAD alarms. Taking all meds as prescribed.     VAD Indication: Destination Therapy    VAD interrogation & Equipment Management: Speed: 6500 Flow: 5.7 Power: 5.6 w    PI: 2.8 Hct: 44   Alarms: none Events: 100+ daily   Primary Controller:  Replaced. Replace back up battery in  months Back up controller: did not bring   Annual Equipment Maintenance on UBC/PM was performed 09/03/20    I reviewed the LVAD parameters from today and compared the results to the patient's prior recorded data.  LVAD interrogation was NEGATIVE for significant power changes, NEGATIVE for clinical alarms and STABLE for PI events/speed drops. No programming changes were made and pump is functioning within specified parameters.   Past Medical History:  Diagnosis Date   Asthma    CHF (congestive heart failure) ([facility])    a. 09/2016: EF 20-25% with cath showing normal cors   GERD (gastroesophageal reflux disease)    History of hiatal hernia    LVAD (left ventricular assist device) present ([facility])    OSA on CPAP 09/06/2018   Severe OSA with AHI 68/hr on CPAP at 12cm H2O   Past Surgical History:  Procedure Laterality Date   APPLICATION OF A-CELL OF CHEST/ABDOMEN N/A 08/24/2019   Procedure: Application Of A-Cell Of Chest/Abdomen;  Surgeon: Ivin Poot, MD;  Location: [facility];  Service: Thoracic;  Laterality: N/A;   APPLICATION OF A-CELL OF CHEST/ABDOMEN N/A 09/05/2019   Procedure: Application Of A-Cell Of Chest/Abdomen;  Surgeon: Peggye Form, DO;  Location: [facility] OR;  Service: Plastics;  Laterality: N/A;   APPLICATION OF A-CELL OF CHEST/ABDOMEN  09/03/2019   Procedure: Application Of A-Cell Of Chest/Abdomen;  Surgeon: Kerin Perna, MD;  Location: [facility] OR;  Service: Thoracic;;    APPLICATION OF WOUND VAC N/A 08/22/2019   Procedure: Debridement, Irrigation and Packing of Abdominal Incision.;  Surgeon: Kerin Perna, MD;  Location: [facility] OR;  Service: Thoracic;  Laterality: N/A;   APPLICATION OF WOUND VAC N/A 08/24/2019   Procedure: Irrigation and Debridement with WOUND VAC APPLICATION;  Surgeon: Kerin Perna, MD;  Location: [facility] OR;  Service: Thoracic;  Laterality: N/A;   APPLICATION OF WOUND VAC N/A 08/30/2019   Procedure: APPLICATION OF WOUND VAC;  Surgeon: Kerin Perna, MD;  Location: [facility] OR;  Service: Thoracic;  Laterality: N/A;   APPLICATION OF WOUND VAC N/A 09/03/2019   Procedure: WOUND VAC CHANGE;  Surgeon: Kerin Perna, MD;  Location: [facility] OR;  Service: Thoracic;  Laterality: N/A;   BIOPSY  08/23/2018   Procedure: BIOPSY;  Surgeon: Lemar Lofty., MD;  Location: [facility] ENDOSCOPY;  Service: Gastroenterology;;   COLONOSCOPY N/A 08/23/2018   Procedure: COLONOSCOPY;  Surgeon: Lemar Lofty., MD;  Location: [facility] ENDOSCOPY;  Service: Gastroenterology;  Laterality: N/A;   ENTEROSCOPY N/A 08/23/2018   Procedure: ENTEROSCOPY;  Surgeon: Meridee Score Netty Starring., MD;  Location: [facility] ENDOSCOPY;  Service: Gastroenterology;  Laterality: N/A;   HEMOSTASIS CLIP PLACEMENT  08/23/2018   Procedure: HEMOSTASIS CLIP PLACEMENT;  Surgeon: Lemar Lofty., MD;  Location: [facility] ENDOSCOPY;  Service: Gastroenterology;;   IABP INSERTION N/A 09/05/2017   Procedure: IABP INSERTION;  Surgeon: Dolores Patty, MD;  Location: [facility] INVASIVE CV LAB;  Service: Cardiovascular;  Laterality: N/A;   INSERTION OF IMPLANTABLE LEFT VENTRICULAR ASSIST DEVICE N/A 09/06/2017   Procedure: INSERTION OF IMPLANTABLE LEFT VENTRICULAR ASSIST DEVICE - HM3;  Surgeon: Kerin Perna, MD;  Location: [facility] OR;  Service: Open Heart Surgery;  Laterality: N/A;  HM3   MUSCLE FLAP CLOSURE N/A 09/05/2019   Procedure: MUSCLE FLAP CLOSURE;  Surgeon: Peggye Form, DO;  Location: [facility] OR;  Service: Plastics;   Laterality: N/A;   NASAL FRACTURE SURGERY  1987   POLYPECTOMY  08/23/2018   Procedure: POLYPECTOMY;  Surgeon: Mansouraty, Netty Starring., MD;  Location: [facility] ENDOSCOPY;  Service: Gastroenterology;;   RIGHT HEART CATH N/A 08/30/2017   Procedure: RIGHT HEART CATH;  Surgeon: Dolores Patty, MD;  Location: [facility] INVASIVE CV LAB;  Service: Cardiovascular;  Laterality: N/A;   RIGHT/LEFT HEART CATH AND CORONARY ANGIOGRAPHY N/A 10/08/2016   Procedure: Right/Left Heart Cath and Coronary Angiography;  Surgeon: Orpah Cobb, MD;  Location: [facility] INVASIVE CV LAB;  Service: Cardiovascular;  Laterality: N/A;   STERNAL WOUND DEBRIDEMENT N/A 08/20/2019   Procedure: DEBRIDEMENT OF LVAD DRIVELINE TUNNEL;  Surgeon: Kerin Perna, MD;  Location: [facility] OR;  Service: Thoracic;  Laterality: N/A;   STERNAL WOUND DEBRIDEMENT N/A 09/05/2019   Procedure: DEBRIDEMENT AND CLOSURE OF ABDOMINAL WOUND;  Surgeon: Peggye Form, DO;  Location: [facility] OR;  Service: Plastics;  Laterality: N/A;   SUBMUCOSAL TATTOO INJECTION  08/23/2018   Procedure: SUBMUCOSAL TATTOO INJECTION;  Surgeon: Meridee Score Netty Starring., MD;  Location: [facility] ENDOSCOPY;  Service: Gastroenterology;;   TEE WITHOUT CARDIOVERSION N/A 09/06/2017   Procedure: TRANSESOPHAGEAL ECHOCARDIOGRAM (TEE);  Surgeon: Donata Clay, Theron Arista, MD;  Location: [facility] OR;  Service: Open Heart Surgery;  Laterality: N/A;   WOUND DEBRIDEMENT N/A 08/30/2019   Procedure: Debridement Abdominal Wound;  Surgeon: Ivin Poot, MD;  Location: [facility] OR;  Service: Thoracic;  Laterality: N/A;     Current Outpatient Medications  Medication Sig Dispense Refill   acetaminophen (TYLENOL) 500 MG tablet Take 1,500 mg by mouth 3 (three) times daily as needed for headache (pain).     cephALEXin (KEFLEX) 500 MG capsule Take 1 capsule (500 mg total) by mouth 3 (three) times daily. 90 capsule 11   doxazosin (CARDURA) 1 MG tablet Take 1 tablet (1 mg total) by mouth at bedtime. 30 tablet 5   gabapentin (NEURONTIN) 300 MG  capsule TAKE ONE CAPSULE BY MOUTH THREE TIMES DAILY AT 9am, 3pm, AND 9pm 90 capsule 5   pantoprazole (PROTONIX) 40 MG tablet Take 1 tablet (40 mg total) by mouth 2 (two) times daily. 180 tablet 3   sacubitril-valsartan (ENTRESTO) 97-103 MG Take 1 tablet by mouth 2 (two) times daily. 60 tablet 6   spironolactone (ALDACTONE) 25 MG tablet Take 0.5 tablets (12.5 mg total) by mouth daily. 30 tablet 6   albuterol (PROAIR HFA) 108 (90 Base) MCG/ACT inhaler Inhale 2 puffs into the lungs every 6 (six) hours as needed for wheezing or shortness of breath. (Patient not taking: Reported on 11/11/2021) 1 each 6   enoxaparin (LOVENOX) 40 MG/0.4ML injection Inject 0.4 mLs (40 mg total) into the skin every 12 (twelve) hours. (Patient not taking: Reported on 01/27/2022) 4 mL 0   nicotine (NICODERM CQ - DOSED IN MG/24 HOURS) 21 mg/24hr patch Place 1 patch (21 mg total) onto the skin daily. (Patient not taking: Reported on 04/29/2021) 28 patch 3   sildenafil (REVATIO) 20 MG tablet Take 1 tablet (20 mg total) by mouth 3 (three) times daily. 90 tablet 11   torsemide (DEMADEX) 20 MG tablet Take 1 tablet (20 mg total) by mouth every other day. (Patient not taking: Reported on 06/26/2020) 90 tablet 3   traMADol HCl 100 MG TABS Take 100 mg by mouth every 8 (eight) hours as needed. (Patient not taking: Reported on 09/07/2021) 10 tablet 0   warfarin (COUMADIN) 5 MG tablet Take 1.5 tablets (7.5 mg total) by mouth daily at 4 PM.  7.5 mg (1.5 tab) all days OR as directed by HF clinic 90 tablet 5   No current facility-administered medications for this encounter.    Allergies:   Patient has no known allergies.   Social History:  The patient  reports that he has quit smoking. His smoking use included cigarettes. He has a 12.50 pack-year smoking history. He has never used smokeless tobacco. He reports that he does not currently use alcohol. He reports that he does not currently use drugs after having used the following drugs: Marijuana.    Family History:  The patient's family history includes Alcohol abuse in his cousin; Hypertension in his father.   ROS:  Please see the history of present illness.   All other systems are personally reviewed and negative.      Vital Signs: Temp:  Doppler: 100 Automatic BP: 118/101 (108) HR: 90 SPO2: 100%   Weight: 204.8 lbs w/ eqt Last wt: 205.4 lbs w/ equip   Exam: General:  NAD.  HEENT: normal  Neck: supple. JVP not elevated.  Carotids 2+ bilat; no bruits. No lymphadenopathy or thryomegaly appreciated. Cor: LVAD hum.  Lungs: Clear. Abdomen: soft, nontender, non-distended. No hepatosplenomegaly. No bruits or masses.  Good bowel sounds. Driveline site clean. Anchor in place.  Extremities: no cyanosis, clubbing, rash. Warm no edema  Neuro: alert & oriented x 3. No focal deficits. Moves all 4 without problem     Recent Labs: 04/21/2022: ALT 13; BUN 12; Creatinine, Ser 1.03; Hemoglobin 12.2; Platelets 174; Potassium 3.5; Sodium 134    Wt Readings from Last 3 Encounters:  04/21/22 92.9 kg (204 lb 12.8 oz)  01/27/22 93.2 kg (205 lb 6.4 oz)  11/11/21 95.3 kg (210 lb 3.2 oz)      ASSESSMENT AND PLAN:   1. 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 Treated with milrinone and then weaned off.  Diuresed 40 pounds. VAD speed increased 5600 -> 6300 - He continues to do very well. NYHA I - Volume status looks good - Continue Entresto and spiro.  - We have recently been discussing transplant. He is interested but needs to stop smoking and have 6 months of negative cotinine tests. We again encouraged him to work on smoking cessation. Discussed again today  2. HM3 LVAD 09/06/2017.  - VAD interrogated personally. Parameters stable. - LDH 183 - INR 1.9 goal 2.0-2.5  Discussed dosing with PharmD personally. - Off ASA with PUD.  - hgb stable 12.2 - DL site looks good - Controller  and modular cable replaced  3.  Essential HTN - MAPs improved with cardura. MAPs high today but hasn't taken meds yet   4. H/o ETOH pancreatitis, 11/21 - remains quit from drinking (was stressed about his son)  5. Recurrent DL infection with subxiphoid abscess - s/p debridement and pec flap in 4/21 - remains on keflex. Site continues to look good - No fevers or chills  6.  OSA - sleep study with very severe OSA (AHI 69/hr) - He has refused therapy multiple times but seems to have improved with weight loss and ETOH cessation. We have offered repeat testing to see if OSA has improved but he refuses.  - No change  7.  H/o GI bleed  - 08/2018 EGD showed gastritis and nonbleeding duodenal ulcers, colonoscopy with 5 polyps removed.  - Remains on PPI. No recent GI bleeding - hgb stable 12.2   8. Tobacco Abuse  - continues to smoke a few cigs/day. No change - we discussed again that he needs to stop smoking to qualify for transplant.  - no change  Total time spent 35 minutes. Over half that time spent discussing above.    Signed, [name], MD  04/21/2022 4:34 PM  [place] [address] [facility] [address] [phone] (office) [phone] (fax)

## 2022-04-21 NOTE — Patient Instructions (Signed)
No medication changes today Coumadin dosing per Lauren PharmD Return to VAD clinic in 1 week for dressing change Return to VAD clinic in 2 months for follow up with Dr [name] If you have recurrent alarming page VAD coordinator

## 2022-04-23 ENCOUNTER — Other Ambulatory Visit (HOSPITAL_COMMUNITY): Payer: Self-pay

## 2022-04-23 DIAGNOSIS — Z7901 Long term (current) use of anticoagulants: Secondary | ICD-10-CM

## 2022-04-23 DIAGNOSIS — Z95811 Presence of heart assist device: Secondary | ICD-10-CM

## 2022-04-28 ENCOUNTER — Other Ambulatory Visit (HOSPITAL_COMMUNITY): Payer: 59

## 2022-05-05 ENCOUNTER — Ambulatory Visit (HOSPITAL_COMMUNITY): Payer: Self-pay | Admitting: Pharmacist

## 2022-05-05 ENCOUNTER — Ambulatory Visit (HOSPITAL_COMMUNITY)
Admission: RE | Admit: 2022-05-05 | Discharge: 2022-05-05 | Disposition: A | Discharge status: Home / Self Care | Payer: 59 | Source: Ambulatory Visit | Attending: Cardiology | Admitting: Cardiology

## 2022-05-05 DIAGNOSIS — Z452 Encounter for adjustment and management of vascular access device: Secondary | ICD-10-CM | POA: Diagnosis present

## 2022-05-05 DIAGNOSIS — Z95811 Presence of heart assist device: Secondary | ICD-10-CM | POA: Diagnosis not present

## 2022-05-05 DIAGNOSIS — Z7901 Long term (current) use of anticoagulants: Secondary | ICD-10-CM | POA: Insufficient documentation

## 2022-05-05 DIAGNOSIS — N529 Male erectile dysfunction, unspecified: Secondary | ICD-10-CM

## 2022-05-05 LAB — PROTIME-INR
INR: 1.5 — ABNORMAL HIGH (ref 0.8–1.2)
Prothrombin Time: 17.6 seconds — ABNORMAL HIGH (ref 11.4–15.2)

## 2022-05-05 MED ORDER — SILDENAFIL CITRATE 100 MG PO TABS: 100.0000 mg | ORAL_TABLET | Freq: Every day | ORAL | 5 refills | Status: DC | PRN

## 2022-05-05 NOTE — Progress Notes (Signed)
Patient presents for dressing change in VAD Clinic today.  Pt requesting Viagra. Discussed with Dr [name]- order received, and sent to pt's pharmacy.   Exit Site Care: Drive line is being maintained by VAD coordinators. Existing VAD dressing removed and site care performed using sterile technique. Drive line exit site cleaned with CHG swabs x 2 and rinsed with saline, allowed to dry, and Sorbaview dressing with silverlon patch applied. Anchor reapplied and covered with two large tegaderms. Drive line exit site incorporated, the velour is fully implanted at exit site. No erythema, tenderness, drainage, or foul odor noted.  Pt denies fever or chills. Continue weekly dressing changes. VAD coordinators to perform weekly dressing changes. Patient has sufficient kits at home.  Plan: Return to clinic in 1 week for dressing change  Coumadin dosing per Lauren PharmD  Alyce Pagan RN VAD Coordinator  Office: [phone]  24/7 Pager: [phone]

## 2022-05-06 ENCOUNTER — Other Ambulatory Visit (HOSPITAL_COMMUNITY): Payer: Self-pay

## 2022-05-06 DIAGNOSIS — Z7901 Long term (current) use of anticoagulants: Secondary | ICD-10-CM

## 2022-05-06 DIAGNOSIS — Z95811 Presence of heart assist device: Secondary | ICD-10-CM

## 2022-05-11 ENCOUNTER — Other Ambulatory Visit (HOSPITAL_COMMUNITY): Payer: 59

## 2022-05-18 ENCOUNTER — Ambulatory Visit (HOSPITAL_COMMUNITY)
Admission: RE | Admit: 2022-05-18 | Discharge: 2022-05-18 | Disposition: A | Discharge status: Home / Self Care | Payer: 59 | Source: Ambulatory Visit | Attending: Family Medicine | Admitting: Family Medicine

## 2022-05-18 ENCOUNTER — Ambulatory Visit (HOSPITAL_COMMUNITY): Payer: Self-pay | Admitting: Pharmacist

## 2022-05-18 DIAGNOSIS — Z7901 Long term (current) use of anticoagulants: Secondary | ICD-10-CM | POA: Diagnosis not present

## 2022-05-18 DIAGNOSIS — Z95811 Presence of heart assist device: Secondary | ICD-10-CM | POA: Insufficient documentation

## 2022-05-18 DIAGNOSIS — Z4801 Encounter for change or removal of surgical wound dressing: Secondary | ICD-10-CM | POA: Diagnosis present

## 2022-05-18 LAB — PROTIME-INR
INR: 1.4 — ABNORMAL HIGH (ref 0.8–1.2)
Prothrombin Time: 17.1 seconds — ABNORMAL HIGH (ref 11.4–15.2)

## 2022-05-18 NOTE — Progress Notes (Signed)
Patient presents for dressing change in VAD Clinic today.   Exit Site Care: Drive line is being maintained by VAD coordinators. Existing VAD dressing removed and site care performed using sterile technique. Drive line exit site cleaned with CHG swabs x 2 and rinsed with saline, allowed to dry, and Sorbaview dressing with silverlon patch applied. Anchor reapplied and covered with two large tegaderms. Drive line exit site incorporated, the velour is fully implanted at exit site. No erythema, tenderness, drainage, or foul odor noted.  Pt denies fever or chills. Continue weekly dressing changes. VAD coordinators to perform weekly dressing changes. Patient has sufficient kits at home.  Plan: Return to clinic in 10 days for dressing change  Coumadin dosing per [place] RN [place] Coordinator  Office: [phone]  24/7 Pager: [phone]

## 2022-05-20 ENCOUNTER — Other Ambulatory Visit (HOSPITAL_COMMUNITY): Payer: Self-pay

## 2022-05-20 DIAGNOSIS — Z7901 Long term (current) use of anticoagulants: Secondary | ICD-10-CM

## 2022-05-20 DIAGNOSIS — Z95811 Presence of heart assist device: Secondary | ICD-10-CM

## 2022-05-28 ENCOUNTER — Other Ambulatory Visit (HOSPITAL_COMMUNITY): Payer: 59

## 2022-05-31 ENCOUNTER — Other Ambulatory Visit (HOSPITAL_COMMUNITY): Payer: Self-pay | Admitting: Internal Medicine

## 2022-05-31 DIAGNOSIS — I5022 Chronic systolic (congestive) heart failure: Secondary | ICD-10-CM

## 2022-05-31 DIAGNOSIS — I10 Essential (primary) hypertension: Secondary | ICD-10-CM

## 2022-05-31 DIAGNOSIS — Z95811 Presence of heart assist device: Secondary | ICD-10-CM

## 2022-06-02 ENCOUNTER — Ambulatory Visit (HOSPITAL_COMMUNITY)
Admission: RE | Admit: 2022-06-02 | Discharge: 2022-06-02 | Disposition: A | Discharge status: Home / Self Care | Payer: 59 | Source: Ambulatory Visit | Attending: Internal Medicine | Admitting: Internal Medicine

## 2022-06-02 ENCOUNTER — Ambulatory Visit (HOSPITAL_COMMUNITY): Payer: Self-pay | Admitting: Pharmacist

## 2022-06-02 ENCOUNTER — Other Ambulatory Visit (HOSPITAL_COMMUNITY): Payer: Self-pay

## 2022-06-02 DIAGNOSIS — Z7901 Long term (current) use of anticoagulants: Secondary | ICD-10-CM

## 2022-06-02 DIAGNOSIS — Z95811 Presence of heart assist device: Secondary | ICD-10-CM | POA: Diagnosis present

## 2022-06-02 LAB — PROTIME-INR
INR: 1.6 — ABNORMAL HIGH (ref 0.8–1.2)
Prothrombin Time: 18.8 seconds — ABNORMAL HIGH (ref 11.4–15.2)

## 2022-06-02 NOTE — Progress Notes (Signed)
Patient presents for dressing change in VAD Clinic today.  Exit Site Care: Drive line is being maintained by VAD coordinators. Existing VAD dressing removed and site care performed using sterile technique. Drive line exit site cleaned with CHG swabs x 2 and rinsed with saline, allowed to dry, and Sorbaview dressing with silverlon patch applied. Anchor reapplied and covered with two large tegaderms. Drive line exit site incorporated, the velour is fully implanted at exit site. No erythema, tenderness, drainage, or foul odor noted.  Pt denies fever or chills. Continue weekly dressing changes. VAD coordinators to perform weekly dressing changes. Patient has sufficient kits at home.  Plan: Return to clinic in 10 days for dressing change  Coumadin dosing per Lauren PharmD  Emerson Monte RN [place] Coordinator  Office: [phone]  24/7 Pager: [phone]

## 2022-06-08 ENCOUNTER — Other Ambulatory Visit (HOSPITAL_COMMUNITY): Payer: 59

## 2022-06-11 ENCOUNTER — Other Ambulatory Visit (HOSPITAL_COMMUNITY): Payer: 59

## 2022-06-15 ENCOUNTER — Ambulatory Visit (HOSPITAL_COMMUNITY)
Admission: RE | Admit: 2022-06-15 | Discharge: 2022-06-15 | Disposition: A | Discharge status: Home / Self Care | Payer: 59 | Source: Ambulatory Visit | Attending: Cardiology | Admitting: Cardiology

## 2022-06-15 ENCOUNTER — Ambulatory Visit (HOSPITAL_COMMUNITY): Payer: Self-pay | Admitting: Pharmacist

## 2022-06-15 DIAGNOSIS — Z7901 Long term (current) use of anticoagulants: Secondary | ICD-10-CM | POA: Diagnosis not present

## 2022-06-15 DIAGNOSIS — Z95811 Presence of heart assist device: Secondary | ICD-10-CM | POA: Diagnosis present

## 2022-06-15 LAB — PROTIME-INR
INR: 1.6 — ABNORMAL HIGH (ref 0.8–1.2)
Prothrombin Time: 19.3 seconds — ABNORMAL HIGH (ref 11.4–15.2)

## 2022-06-15 NOTE — Progress Notes (Signed)
Patient presents for dressing change in VAD Clinic today.  Exit Site Care: Drive line is being maintained by VAD coordinators. Existing VAD dressing removed and site care performed using sterile technique. Drive line exit site cleaned with CHG swabs x 2 and rinsed with saline, allowed to dry, and Sorbaview dressing with silverlon patch applied. Anchor reapplied and covered with two large tegaderms. Drive line exit site incorporated, the velour is fully implanted at exit site. No erythema, tenderness, drainage, or foul odor noted.  Pt denies fever or chills. Continue weekly dressing changes. VAD coordinators to perform weekly dressing changes. Patient has sufficient kits at home.  Plan: Return to clinic in a week for dressing change  Coumadin dosing per Lauren PharmD  Bobbye Morton RN [place] Coordinator  Office: [phone]  24/7 Pager: [phone]

## 2022-06-22 ENCOUNTER — Other Ambulatory Visit (HOSPITAL_COMMUNITY): Payer: 59

## 2022-06-25 ENCOUNTER — Ambulatory Visit (HOSPITAL_COMMUNITY)
Admission: RE | Admit: 2022-06-25 | Discharge: 2022-06-25 | Disposition: A | Discharge status: Home / Self Care | Payer: 59 | Source: Ambulatory Visit | Attending: Cardiology | Admitting: Cardiology

## 2022-06-25 DIAGNOSIS — Z95811 Presence of heart assist device: Secondary | ICD-10-CM | POA: Diagnosis not present

## 2022-06-25 DIAGNOSIS — Z4801 Encounter for change or removal of surgical wound dressing: Secondary | ICD-10-CM | POA: Diagnosis not present

## 2022-06-25 MED ORDER — WARFARIN SODIUM 5 MG PO TABS: 7.5000 mg | ORAL_TABLET | Freq: Every day | ORAL | 5 refills | Status: DC

## 2022-06-25 NOTE — Addendum Note (Signed)
Encounter addended by: Christinia Gully, RN on: 06/25/2022 11:44 AM  Actions taken: Visit diagnoses modified, Pharmacy for encounter modified, Order list changed, Diagnosis association updated

## 2022-06-25 NOTE — Progress Notes (Signed)
Patient presents for dressing change in VAD Clinic today.  Exit Site Care: Drive line is being maintained by VAD coordinators. Existing VAD dressing removed and site care performed using sterile technique. Drive line exit site cleaned with CHG swabs x 2 and rinsed with saline, allowed to dry, and Sorbaview dressing with silverlon patch applied. Anchor reapplied and covered with two large tegaderms. Drive line exit site incorporated, the velour is fully implanted at exit site. No erythema, tenderness, drainage, or foul odor noted.  Pt denies fever or chills. Continue weekly dressing changes. VAD coordinators to perform weekly dressing changes. Patient has sufficient kits at home.  Plan: Return to clinic on Wednesday for a visit with Dr [name]   [name] RN [place] Coordinator  Office: [phone]  24/7 Pager: [phone]

## 2022-06-28 ENCOUNTER — Other Ambulatory Visit (HOSPITAL_COMMUNITY): Payer: Self-pay | Admitting: *Deleted

## 2022-06-28 DIAGNOSIS — Z95811 Presence of heart assist device: Secondary | ICD-10-CM

## 2022-06-28 DIAGNOSIS — Z7901 Long term (current) use of anticoagulants: Secondary | ICD-10-CM

## 2022-06-30 ENCOUNTER — Encounter (HOSPITAL_COMMUNITY): Payer: 59 | Admitting: Internal Medicine

## 2022-07-02 ENCOUNTER — Ambulatory Visit (HOSPITAL_COMMUNITY): Payer: Self-pay | Admitting: Pharmacist

## 2022-07-02 ENCOUNTER — Ambulatory Visit (HOSPITAL_COMMUNITY)
Admission: RE | Admit: 2022-07-02 | Discharge: 2022-07-02 | Disposition: A | Discharge status: Home / Self Care | Payer: 59 | Source: Ambulatory Visit | Attending: Internal Medicine | Admitting: Internal Medicine

## 2022-07-02 VITALS — BP 128/0 | HR 80 | Ht 74.0 in | Wt 210.6 lb

## 2022-07-02 DIAGNOSIS — D649 Anemia, unspecified: Secondary | ICD-10-CM | POA: Diagnosis not present

## 2022-07-02 DIAGNOSIS — G4733 Obstructive sleep apnea (adult) (pediatric): Secondary | ICD-10-CM | POA: Diagnosis not present

## 2022-07-02 DIAGNOSIS — J45909 Unspecified asthma, uncomplicated: Secondary | ICD-10-CM | POA: Diagnosis not present

## 2022-07-02 DIAGNOSIS — Z720 Tobacco use: Secondary | ICD-10-CM

## 2022-07-02 DIAGNOSIS — Z8711 Personal history of peptic ulcer disease: Secondary | ICD-10-CM | POA: Insufficient documentation

## 2022-07-02 DIAGNOSIS — Z7901 Long term (current) use of anticoagulants: Secondary | ICD-10-CM | POA: Diagnosis not present

## 2022-07-02 DIAGNOSIS — Z8719 Personal history of other diseases of the digestive system: Secondary | ICD-10-CM | POA: Insufficient documentation

## 2022-07-02 DIAGNOSIS — I10 Essential (primary) hypertension: Secondary | ICD-10-CM

## 2022-07-02 DIAGNOSIS — Z95811 Presence of heart assist device: Secondary | ICD-10-CM

## 2022-07-02 DIAGNOSIS — F17210 Nicotine dependence, cigarettes, uncomplicated: Secondary | ICD-10-CM | POA: Insufficient documentation

## 2022-07-02 DIAGNOSIS — I5022 Chronic systolic (congestive) heart failure: Secondary | ICD-10-CM | POA: Diagnosis present

## 2022-07-02 DIAGNOSIS — I110 Hypertensive heart disease with heart failure: Secondary | ICD-10-CM | POA: Diagnosis not present

## 2022-07-02 DIAGNOSIS — K219 Gastro-esophageal reflux disease without esophagitis: Secondary | ICD-10-CM | POA: Insufficient documentation

## 2022-07-02 DIAGNOSIS — Z79899 Other long term (current) drug therapy: Secondary | ICD-10-CM | POA: Insufficient documentation

## 2022-07-02 LAB — BASIC METABOLIC PANEL
Anion gap: 12 (ref 5–15)
BUN: 10 mg/dL (ref 6–20)
CO2: 21 mmol/L — ABNORMAL LOW (ref 22–32)
Calcium: 8.6 mg/dL — ABNORMAL LOW (ref 8.9–10.3)
Chloride: 105 mmol/L (ref 98–111)
Creatinine, Ser: 0.93 mg/dL (ref 0.61–1.24)
GFR, Estimated: >60 mL/min (ref >60)
Glucose, Bld: 153 mg/dL — ABNORMAL HIGH (ref 70–99)
Potassium: 3.5 mmol/L (ref 3.5–5.1)
Sodium: 138 mmol/L (ref 135–145)

## 2022-07-02 LAB — LACTATE DEHYDROGENASE: LDH: 202 U/L — ABNORMAL HIGH (ref 98–192)

## 2022-07-02 LAB — CBC
HCT: 36.8 % — ABNORMAL LOW (ref 39.0–52.0)
Hemoglobin: 12.1 g/dL — ABNORMAL LOW (ref 13.0–17.0)
MCH: 29.2 pg (ref 26.0–34.0)
MCHC: 32.9 g/dL (ref 30.0–36.0)
MCV: 88.7 fL (ref 80.0–100.0)
Platelets: 191 10*3/uL (ref 150–400)
RBC: 4.15 MIL/uL — ABNORMAL LOW (ref 4.22–5.81)
RDW: 13.7 % (ref 11.5–15.5)
WBC: 6.9 10*3/uL (ref 4.0–10.5)
nRBC: 0 % (ref 0.0–0.2)

## 2022-07-02 LAB — PROTIME-INR
INR: 3.1 — ABNORMAL HIGH (ref 0.8–1.2)
Prothrombin Time: 32 seconds — ABNORMAL HIGH (ref 11.4–15.2)

## 2022-07-02 MED ORDER — NICOTINE POLACRILEX 4 MG MT GUM: 4.0000 mg | CHEWING_GUM | OROMUCOSAL | 3 refills | Status: DC | PRN

## 2022-07-02 NOTE — Progress Notes (Signed)
VAD Clinic Note    ID:  Jonathan Wood Dec 20, 1970, MRN [id number]  Location: Home  Provider location: [place] [facility] Type of Visit: Established patient  PCP: [facility]  Cardiologist:  [name], MD Primary HF: Dr [name]   Chief Complaint: Heart Failure/LVAD  History of Present Illness:  Jonathan Wood is a 52 y.o. 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  Admitted 4/20 with volume overload and anemia. Diuresed with IV lasix and transitioned to torsemide 20 mg daily. GI consulted EGD with gastritis and nonbleeding duodenal ulcers, colonoscopy with 5 polyps removed.  Admitted 12/20 for DL infection with overlying cellulitis and volume overload.   Admitted 4/21 for DL infection with large subxiphoid abscess. Underwent several debridements and eventually a pec muscle flap. Wound cx + MSSA. Initially on ancef and rifabutin but expanded to cefipime then changed to IV ancef and po rifabutin. Now on oral keflex.  Readmitted 6/21 with superficial abscess and tachypnea. Superficial abscess I&D'd and packed. CT scan done and showed possible deeper fluid collection. Reviewed with TCTS who felt it was muscle flap. Sent to IR for possible drainage but u/s confirmed it was the muscle flap and no drainable fluid collection. Wound cx negative. ID saw and recommended continuing Keflex.   Admitted 8/21 with lactic acidosis due to cardiogenic shock. Started on 0.25 of milrinone and NE. Ramp echo on 8/13 with severe RV dysfunction. Speed increased 5600-> 6300. Tolerated ok. Repeat ramp echo on 8/20 speed increased to 6500. Diuresed over 40 pounds.   Admitted 10/21 with  ETOH pancreatitis. Gallbladder ok. Resolved with medical management    Here for routine f/u. Doing well. 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 Denies SOB, edema, orthopnea or PND.  Denies orthopnea or PND. No fevers, chills or problems with driveline. No bleeding, melena or neuro symptoms. No VAD alarms. Taking all meds as prescribed. Still smoking a few cigarettes per day.   VAD Indication: Destination Therapy    VAD interrogation & Equipment Management: Speed: 6500 Flow: 5.9 Power: 6.1 w    PI: 2.9 Hct: 39   Alarms: few LV Events: 20-30 daily   Primary Controller:  Replace back up battery in 31 months Back up controller: did not bring   Annual Equipment Maintenance on UBC/PM was performed 09/16/21    I reviewed the LVAD parameters from today and compared the results to the patient's prior recorded data.  LVAD interrogation was NEGATIVE for significant power changes, NEGATIVE for clinical alarms and STABLE for PI events/speed drops. No programming changes were made and pump is functioning within specified parameters.  Past Medical History:  Diagnosis Date   Asthma    CHF (congestive heart failure) ([place])    a. 09/2016: EF 20-25% with cath showing normal cors   GERD (gastroesophageal reflux disease)    History of hiatal hernia    LVAD (left ventricular assist device) present (HCC)    OSA on CPAP 09/06/2018   Severe OSA with AHI 68/hr on CPAP at 12cm H2O   Past Surgical History:  Procedure Laterality Date   APPLICATION OF A-CELL OF CHEST/ABDOMEN N/A 08/24/2019   Procedure: Application Of A-Cell Of Chest/Abdomen;  Surgeon: Ivin Poot, MD;  Location: [place];  Service: Thoracic;  Laterality: N/A;   APPLICATION OF A-CELL OF CHEST/ABDOMEN N/A 09/05/2019   Procedure: Application Of A-Cell Of  Chest/Abdomen;  Surgeon: Wallace Going, DO;  Location: [place];  Service: Plastics;  Laterality: N/A;   APPLICATION OF A-CELL OF CHEST/ABDOMEN  09/03/2019   Procedure: Application Of A-Cell Of Chest/Abdomen;  Surgeon: Ivin Poot, MD;  Location: [place];  Service: Thoracic;;   APPLICATION OF WOUND VAC N/A 08/22/2019   Procedure: Debridement, Irrigation and Packing of  Abdominal Incision.;  Surgeon: Ivin Poot, MD;  Location: [place];  Service: Thoracic;  Laterality: N/A;   APPLICATION OF WOUND VAC N/A 08/24/2019   Procedure: Irrigation and Debridement with WOUND VAC APPLICATION;  Surgeon: Ivin Poot, MD;  Location: [place];  Service: Thoracic;  Laterality: N/A;   APPLICATION OF WOUND VAC N/A 08/30/2019   Procedure: APPLICATION OF WOUND VAC;  Surgeon: Ivin Poot, MD;  Location: [place];  Service: Thoracic;  Laterality: N/A;   APPLICATION OF WOUND VAC N/A 09/03/2019   Procedure: WOUND VAC CHANGE;  Surgeon: Ivin Poot, MD;  Location: [place];  Service: Thoracic;  Laterality: N/A;   BIOPSY  08/23/2018   Procedure: BIOPSY;  Surgeon: Irving Copas., MD;  Location: [place];  Service: Gastroenterology;;   COLONOSCOPY N/A 08/23/2018   Procedure: COLONOSCOPY;  Surgeon: Irving Copas., MD;  Location: [place];  Service: Gastroenterology;  Laterality: N/A;   ENTEROSCOPY N/A 08/23/2018   Procedure: ENTEROSCOPY;  Surgeon: Rush Landmark Telford Nab., MD;  Location: [place];  Service: Gastroenterology;  Laterality: N/A;   HEMOSTASIS CLIP PLACEMENT  08/23/2018   Procedure: HEMOSTASIS CLIP PLACEMENT;  Surgeon: Irving Copas., MD;  Location: [place];  Service: Gastroenterology;;   IABP INSERTION N/A 09/05/2017   Procedure: IABP INSERTION;  Surgeon: Jolaine Artist, MD;  Location: [place] [facility];  Service: Cardiovascular;  Laterality: N/A;   INSERTION OF IMPLANTABLE LEFT VENTRICULAR ASSIST DEVICE N/A 09/06/2017   Procedure: INSERTION OF IMPLANTABLE LEFT VENTRICULAR ASSIST DEVICE - HM3;  Surgeon: Ivin Poot, MD;  Location: [place];  Service: Open Heart Surgery;  Laterality: N/A;  HM3   MUSCLE FLAP CLOSURE N/A 09/05/2019   Procedure: MUSCLE FLAP CLOSURE;  Surgeon: Wallace Going, DO;  Location: [place];  Service: Plastics;  Laterality: N/A;   NASAL FRACTURE SURGERY  1987   POLYPECTOMY  08/23/2018   Procedure: POLYPECTOMY;   Surgeon: Mansouraty, Telford Nab., MD;  Location: [place];  Service: Gastroenterology;;   RIGHT HEART CATH N/A 08/30/2017   Procedure: RIGHT HEART CATH;  Surgeon: Jolaine Artist, MD;  Location: [place] [facility];  Service: Cardiovascular;  Laterality: N/A;   RIGHT/LEFT HEART CATH AND CORONARY ANGIOGRAPHY N/A 10/08/2016   Procedure: Right/Left Heart Cath and Coronary Angiography;  Surgeon: Dixie Dials, MD;  Location: [place] [facility];  Service: Cardiovascular;  Laterality: N/A;   STERNAL WOUND DEBRIDEMENT N/A 08/20/2019   Procedure: DEBRIDEMENT OF LVAD DRIVELINE TUNNEL;  Surgeon: Ivin Poot, MD;  Location: [place];  Service: Thoracic;  Laterality: N/A;   STERNAL WOUND DEBRIDEMENT N/A 09/05/2019   Procedure: DEBRIDEMENT AND CLOSURE OF ABDOMINAL WOUND;  Surgeon: Wallace Going, DO;  Location: [place];  Service: Plastics;  Laterality: N/A;   SUBMUCOSAL TATTOO INJECTION  08/23/2018   Procedure: SUBMUCOSAL TATTOO INJECTION;  Surgeon: Rush Landmark Telford Nab., MD;  Location: [place];  Service: Gastroenterology;;   TEE WITHOUT CARDIOVERSION N/A 09/06/2017   Procedure: TRANSESOPHAGEAL ECHOCARDIOGRAM (TEE);  Surgeon: Prescott Gum, Collier Salina, MD;  Location: [place];  Service: Open Heart Surgery;  Laterality: N/A;   WOUND DEBRIDEMENT N/A 08/30/2019  Procedure: Debridement Abdominal Wound;  Surgeon: Ivin Poot, MD;  [facility];  Service: Thoracic;  Laterality: N/A;     Current Outpatient Medications  Medication Sig Dispense Refill   acetaminophen (TYLENOL) 500 MG tablet Take 1,500 mg by mouth 3 (three) times daily as needed for headache (pain).     albuterol (PROAIR HFA) 108 (90 Base) MCG/ACT inhaler Inhale 2 puffs into the lungs every 6 (six) hours as needed for wheezing or shortness of breath. (Patient not taking: Reported on 11/11/2021) 1 each 6   cephALEXin (KEFLEX) 500 MG capsule Take 1 capsule (500 mg total) by mouth 3 (three) times daily. 90 capsule 11   doxazosin (CARDURA) 2 MG  tablet TAKE 1/2 TABLET BY MOUTH AT BEDTIME 45 tablet 1   enoxaparin (LOVENOX) 40 MG/0.4ML injection Inject 0.4 mLs (40 mg total) into the skin every 12 (twelve) hours. (Patient not taking: Reported on 01/27/2022) 4 mL 0   gabapentin (NEURONTIN) 300 MG capsule TAKE ONE CAPSULE BY MOUTH THREE TIMES DAILY AT 9am, 3pm, AND 9pm 90 capsule 5   nicotine (NICODERM CQ - DOSED IN MG/24 HOURS) 21 mg/24hr patch Place 1 patch (21 mg total) onto the skin daily. (Patient not taking: Reported on 04/29/2021) 28 patch 3   pantoprazole (PROTONIX) 40 MG tablet Take 1 tablet (40 mg total) by mouth 2 (two) times daily. 180 tablet 3   sacubitril-valsartan (ENTRESTO) 97-103 MG Take 1 tablet by mouth 2 (two) times daily. 60 tablet 6   sildenafil (REVATIO) 20 MG tablet Take 1 tablet (20 mg total) by mouth 3 (three) times daily. 90 tablet 11   sildenafil (VIAGRA) 100 MG tablet Take 1 tablet (100 mg total) by mouth daily as needed for erectile dysfunction. 10 tablet 5   spironolactone (ALDACTONE) 25 MG tablet Take 0.5 tablets (12.5 mg total) by mouth daily. 30 tablet 6   torsemide (DEMADEX) 20 MG tablet Take 1 tablet (20 mg total) by mouth every other day. (Patient not taking: Reported on 06/26/2020) 90 tablet 3   traMADol HCl 100 MG TABS Take 100 mg by mouth every 8 (eight) hours as needed. (Patient not taking: Reported on 09/07/2021) 10 tablet 0   warfarin (COUMADIN) 5 MG tablet Take 1.5 tablets (7.5 mg total) by mouth daily at 4 PM.  7.5 mg (1.5 tab) all days OR as directed by HF clinic 90 tablet 5   No current facility-administered medications for this encounter.    Allergies:   Patient has no known allergies.   Social History:  The patient  reports that he has quit smoking. His smoking use included cigarettes. He has a 12.50 pack-year smoking history. He has never used smokeless tobacco. He reports that he does not currently use alcohol. He reports that he does not currently use drugs after having used the following drugs:  Marijuana.   Family History:  The patient's family history includes Alcohol abuse in his cousin; Hypertension in his father.   ROS:  Please see the history of present illness.   All other systems are personally reviewed and negative.     Vital Signs: Doppler: 128 Automatic BP: 117/80 (99) HR: 80 SPO2: 99%   Weight: 210.6 lbs w/ eqt Last wt: 204.8 lbs w/ equip     Exam: General:  NAD.  HEENT: normal  Neck: supple. JVP not elevated.  Carotids 2+ bilat; no bruits. No lymphadenopathy or thryomegaly appreciated. Cor: LVAD hum.  Lungs: Clear. Abdomen: soft, nontender, non-distended. No hepatosplenomegaly. No bruits or  masses. Good bowel sounds. Driveline site clean. Anchor in place.  Extremities: no cyanosis, clubbing, rash. Warm no edema  Neuro: alert & oriented x 3. No focal deficits. Moves all 4 without problem   Recent Labs: 04/21/2022: ALT 13; BUN 12; Creatinine, Ser 1.03; Hemoglobin 12.2; Platelets 174; Potassium 3.5; Sodium 134    Wt Readings from Last 3 Encounters:  04/21/22 92.9 kg (204 lb 12.8 oz)  01/27/22 93.2 kg (205 lb 6.4 oz)  11/11/21 95.3 kg (210 lb 3.2 oz)      ASSESSMENT AND PLAN:   1. 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 Treated with milrinone and then weaned off.  Diuresed 40 pounds. VAD speed increased 5600 -> 6300 - He continues to do very well. NYHA I - Volume status looks good - Continue Entresto and spiro.  - We have recently been discussing transplant. He is interested but needs to stop smoking and have 6 months of negative cotinine tests. We again encouraged him to work on smoking cessation. Discussed again today  2. HM3 LVAD 09/06/2017.  - VAD interrogated personally. Parameters stable. - LDH pending - INR pending goal 2.0-2.5  Discussed dosing with PharmD personally. - Off ASA with PUD.  - hgb pending - DL site looks good - Controller  and modular cable replaced at last visit. Looks good today  3.  Essential HTN - Blood pressure well controlled. Continue current regimen.  4. H/o ETOH pancreatitis, 11/21 - remains quit from drinking (was stressed about his son)  5. Recurrent DL infection with subxiphoid abscess - s/p debridement and pec flap in 4/21 - remains on keflex. Site continues to look good - No fevers or chills  6.  OSA - sleep study with very severe OSA (AHI 69/hr) - He has refused therapy multiple times but seems to have improved with weight loss and ETOH cessation. We have offered repeat testing to see if OSA has improved but he refuses.  - No change  7.  H/o GI bleed  - 08/2018 EGD showed gastritis and nonbleeding duodenal ulcers, colonoscopy with 5 polyps removed.  - Remains on PPI. No recent GI bleeding - hgb pending  8. Tobacco Abuse  - continues to smoke a few cigs/day. No change - we discussed again that he needs to stop smoking to qualify for transplant.  - we will give him nicotine gum to use  Total time spent 35 minutes. Over half that time spent discussing above.    Signed, [name], MD  07/02/2022 9:23 AM  [facility] [address] [facility] [place] [address] [phone] (office) [phone] (fax)

## 2022-07-02 NOTE — Progress Notes (Signed)
LVAD INR    

## 2022-07-02 NOTE — Progress Notes (Addendum)
Patient presents for 2 month f/u in VAD clinic today alone. Denies any issues with VAD or equipment.   Pt states he is feeling great. 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 He loves this. 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 Denies lightheadedness, dizziness, falls, heart failure symptoms, and signs of bleeding.   Still smoking 3 - 4 cigarettes per day. He does not feel like the nicotine patch helps we have sent in nicotine gum script for him today. He states he will continue to work on this. He is taking all medications as prescribed.    Vital Signs: Doppler: 128 Automatic BP: 117/80 (99) HR: 80 SPO2: 99%   Weight: 205.4 lbs w/ eqt Last wt: 210.6 lbs w/ equip   VAD Indication: Destination Therapy    VAD interrogation & Equipment Management: Speed: 6500 Flow: 5.9 Power: 6.1 w    PI: 2.9 Hct: 39  Alarms: none Events: 20-30 daily  Primary Controller:  Replace back up battery in 31 months Back up controller: did not bring  Annual Equipment Maintenance on UBC/PM was performed 09/16/21   I reviewed the LVAD parameters from today and compared the results to the patient's prior recorded data.  LVAD interrogation was NEGATIVE for significant power changes, NEGATIVE for clinical alarms and STABLE for PI events/speed drops. No programming changes were made and pump is functioning within specified parameters.   Exit Site Care: Drive line is being maintained weekly by VAD coordinators. Existing VAD dressing removed and site care performed using sterile technique. Drive line exit site cleaned with Chlora prep applicators x 2, RINSED WITH SALINE allowed to dry, and Sorbaview dressing with Silverlon patch reapplied. Anchor reapplied. Covered with 2 large tegaderms. Drive line exit site incorporated, the velour is fully implanted at exit site. No drainage, erythema, tenderness, or foul odor noted.  Pt  denies fever or chills. Continue weekly dressing changes. VAD coordinators to perform weekly dressing changes. Pt has adequate dressing supplies at home.   Device: none  BP & Labs:  Doppler 128 - reflecting MAP   Hgb 12.1 - No S/S of bleeding. Specifically denies melena/BRBPR or nosebleeds.    LDH 202 - established baseline of 200-300. Denies tea-colored urine. No power elevations noted on interrogation.  Patient Instructions:  No medication changes today Coumadin dosing per Lauren PharmD Return to [place] clinic in 1 week for dressing change Return to [place] clinic in 2 months for follow up with Dr [name]  [name] RN [place] Coordinator  Office: [phone]  24/7 Pager: [phone]

## 2022-07-02 NOTE — Addendum Note (Signed)
Encounter addended by: Christinia Gully, RN on: 07/02/2022 1:48 PM  Actions taken: Vitals modified, Clinical Note Signed

## 2022-07-02 NOTE — Patient Instructions (Signed)
No medication changes today Coumadin dosing per Lauren PharmD Return to [place] clinic in 1 week for dressing change Return to [place] clinic in 2 months for follow up with Dr [name]

## 2022-07-02 NOTE — Progress Notes (Signed)
Patient presents for 2 month f/u in VAD clinic today alone. Denies any issues with VAD or equipment.   Pt states he is feeling great. He is working at [employer]. He loves this. 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 Denies lightheadedness, dizziness, falls, heart failure symptoms, and signs of bleeding.   Still smoking 3 - 4 cigarettes per day. He does not feel like the nicotine patch helps. He states he will continue to work on this. He is taking all medications as prescribed. BP elevated this morning; has not taken medications yet today.    Vital Signs: Doppler: 128 Automatic BP: 117/80 (99) HR: 80 SPO2: 99%   Weight: 210.6 lbs w/ eqt Last wt: 204.8 lbs w/ equip   VAD Indication: Destination Therapy    VAD interrogation & Equipment Management: Speed: 6500 Flow: 5.9 Power: 6.1 w    PI: 2.9 Hct: 39  Alarms: few LV Events: 20-30 daily  Primary Controller:  Replace back up battery in 31 months Back up controller: did not bring  Annual Equipment Maintenance on UBC/PM was performed 09/16/21   I reviewed the LVAD parameters from today and compared the results to the patient's prior recorded data.  LVAD interrogation was NEGATIVE for significant power changes, NEGATIVE for clinical alarms and STABLE for PI events/speed drops. No programming changes were made and pump is functioning within specified parameters.   Exit Site Care: Drive line is being maintained weekly by VAD coordinators. Existing VAD dressing removed and site care performed using sterile technique. Drive line exit site cleaned with Chlora prep applicators x 2, RINSED WITH SALINE allowed to dry, and Sorbaview dressing with Silverlon patch reapplied. Anchor reapplied. Covered with 2 large tegaderms. Drive line exit site incorporated, the velour is fully implanted at exit site. No drainage, erythema, tenderness, or foul odor noted.  Pt denies fever or chills. Continue weekly dressing changes.  VAD coordinators to perform weekly dressing changes. Pt has adequate dressing supplies at home.   Device: none  BP & Labs:  Doppler 100- reflecting MAP   Hgb 12.2 - No S/S of bleeding. Specifically denies melena/BRBPR or nosebleeds.    LDH 183 - established baseline of 200-300. Denies tea-colored urine. No power elevations noted on interrogation.  4.5 year Intermacs follow up completed including:  Quality of Life, KCCQ-12, and Neurocognitive trail making.   Pt completed 1550 feet during 6 minute walk.  Back up controller: Not present today  Patient Goals: To continue working and coaching. Plans to quit smoking.  Patient Instructions:  No medication changes today Coumadin dosing per Lauren PharmD Return to [place] clinic in 1 week for dressing change Return to [place] clinic in 2 months for follow up with Dr [name]  Emerson Monte RN [place] Coordinator  Office: [phone]  24/7 Pager: [phone]

## 2022-07-09 ENCOUNTER — Other Ambulatory Visit (HOSPITAL_COMMUNITY): Payer: Self-pay | Admitting: *Deleted

## 2022-07-09 DIAGNOSIS — Z95811 Presence of heart assist device: Secondary | ICD-10-CM

## 2022-07-09 DIAGNOSIS — Z7901 Long term (current) use of anticoagulants: Secondary | ICD-10-CM

## 2022-07-12 ENCOUNTER — Other Ambulatory Visit (HOSPITAL_COMMUNITY): Payer: 59

## 2022-07-14 ENCOUNTER — Ambulatory Visit (HOSPITAL_COMMUNITY)
Admission: RE | Admit: 2022-07-14 | Discharge: 2022-07-14 | Disposition: A | Discharge status: Home / Self Care | Payer: 59 | Source: Ambulatory Visit | Attending: Internal Medicine | Admitting: Internal Medicine

## 2022-07-14 ENCOUNTER — Ambulatory Visit (HOSPITAL_COMMUNITY): Payer: Self-pay | Admitting: Pharmacist

## 2022-07-14 DIAGNOSIS — Z95811 Presence of heart assist device: Secondary | ICD-10-CM | POA: Diagnosis present

## 2022-07-14 DIAGNOSIS — Z7901 Long term (current) use of anticoagulants: Secondary | ICD-10-CM | POA: Insufficient documentation

## 2022-07-14 LAB — PROTIME-INR
INR: 1.7 — ABNORMAL HIGH (ref 0.8–1.2)
Prothrombin Time: 19.7 seconds — ABNORMAL HIGH (ref 11.4–15.2)

## 2022-07-14 NOTE — Progress Notes (Signed)
Patient presents for dressing change and INR in VAD Clinic today.  Exit Site Care: Drive line is being maintained by VAD coordinators. Existing VAD dressing removed and site care performed using sterile technique. Drive line exit site cleaned with CHG swabs x 2 and rinsed with saline, allowed to dry, and Sorbaview dressing with silverlon patch applied. Anchor reapplied and covered with two large tegaderms. Drive line exit site incorporated, the velour is fully implanted at exit site. No erythema, tenderness, drainage, or foul odor noted.  Pt denies fever or chills. Continue weekly dressing changes. VAD coordinators to perform weekly dressing changes. Patient has sufficient kits at home.  Plan: Return to clinic next week for dressing change Coumadin dosing per Lauren PharmD  Emerson Monte RN [place] Coordinator  Office: [phone]  24/7 Pager: [phone]

## 2022-07-15 ENCOUNTER — Other Ambulatory Visit (HOSPITAL_COMMUNITY): Payer: Self-pay

## 2022-07-15 DIAGNOSIS — Z7901 Long term (current) use of anticoagulants: Secondary | ICD-10-CM

## 2022-07-15 DIAGNOSIS — Z95811 Presence of heart assist device: Secondary | ICD-10-CM

## 2022-07-19 ENCOUNTER — Other Ambulatory Visit (HOSPITAL_COMMUNITY): Payer: Self-pay | Admitting: Internal Medicine

## 2022-07-19 DIAGNOSIS — Z95811 Presence of heart assist device: Secondary | ICD-10-CM

## 2022-07-20 ENCOUNTER — Other Ambulatory Visit (HOSPITAL_COMMUNITY): Payer: 59

## 2022-07-21 ENCOUNTER — Other Ambulatory Visit (HOSPITAL_COMMUNITY): Payer: 59

## 2022-07-22 ENCOUNTER — Ambulatory Visit (HOSPITAL_COMMUNITY): Payer: Self-pay | Admitting: Pharmacist

## 2022-07-22 ENCOUNTER — Ambulatory Visit (HOSPITAL_COMMUNITY)
Admission: RE | Admit: 2022-07-22 | Discharge: 2022-07-22 | Disposition: A | Discharge status: Home / Self Care | Payer: 59 | Source: Ambulatory Visit | Attending: Internal Medicine | Admitting: Internal Medicine

## 2022-07-22 ENCOUNTER — Other Ambulatory Visit (HOSPITAL_COMMUNITY): Payer: Self-pay

## 2022-07-22 DIAGNOSIS — Z7901 Long term (current) use of anticoagulants: Secondary | ICD-10-CM | POA: Insufficient documentation

## 2022-07-22 DIAGNOSIS — N529 Male erectile dysfunction, unspecified: Secondary | ICD-10-CM | POA: Insufficient documentation

## 2022-07-22 DIAGNOSIS — I5042 Chronic combined systolic (congestive) and diastolic (congestive) heart failure: Secondary | ICD-10-CM

## 2022-07-22 DIAGNOSIS — T827XXA Infection and inflammatory reaction due to other cardiac and vascular devices, implants and grafts, initial encounter: Secondary | ICD-10-CM

## 2022-07-22 DIAGNOSIS — Z95811 Presence of heart assist device: Secondary | ICD-10-CM

## 2022-07-22 DIAGNOSIS — I420 Dilated cardiomyopathy: Secondary | ICD-10-CM | POA: Diagnosis not present

## 2022-07-22 DIAGNOSIS — I50812 Chronic right heart failure: Secondary | ICD-10-CM

## 2022-07-22 DIAGNOSIS — I5022 Chronic systolic (congestive) heart failure: Secondary | ICD-10-CM | POA: Insufficient documentation

## 2022-07-22 LAB — PROTIME-INR
INR: 2.7 — ABNORMAL HIGH (ref 0.8–1.2)
Prothrombin Time: 28.3 seconds — ABNORMAL HIGH (ref 11.4–15.2)

## 2022-07-22 MED ORDER — SILDENAFIL CITRATE 100 MG PO TABS: 100.0000 mg | ORAL_TABLET | Freq: Every day | ORAL | 5 refills | Status: DC | PRN

## 2022-07-22 MED ORDER — SILDENAFIL CITRATE 20 MG PO TABS: 20.0000 mg | ORAL_TABLET | Freq: Three times a day (TID) | ORAL | 11 refills | Status: DC

## 2022-07-22 MED ORDER — PANTOPRAZOLE SODIUM 40 MG PO TBEC: 40.0000 mg | DELAYED_RELEASE_TABLET | Freq: Two times a day (BID) | ORAL | 3 refills | Status: DC

## 2022-07-22 NOTE — Progress Notes (Signed)
Patient presents for dressing change and INR in VAD Clinic today.  Exit Site Care: Drive line is being maintained by VAD coordinators. Existing VAD dressing removed and site care performed using sterile technique. Drive line exit site cleaned with CHG swabs x 2 and rinsed with saline, allowed to dry, and Sorbaview dressing with silverlon patch applied. Anchor reapplied and covered with two large tegaderms. Drive line exit site incorporated, the velour is fully implanted at exit site. No erythema, tenderness, drainage, or foul odor noted.  Pt denies fever or chills. Continue weekly dressing changes. VAD coordinators to perform weekly dressing changes. Patient has sufficient kits at home.  Plan: Return to clinic next week for dressing change Coumadin dosing per Lauren PharmD  Bobbye Morton RN,BSN [place] Coordinator  Office: [phone]  24/7 Pager: [phone]

## 2022-07-23 ENCOUNTER — Other Ambulatory Visit (HOSPITAL_COMMUNITY): Payer: 59

## 2022-07-29 ENCOUNTER — Other Ambulatory Visit (HOSPITAL_COMMUNITY): Payer: Self-pay

## 2022-07-29 DIAGNOSIS — Z95811 Presence of heart assist device: Secondary | ICD-10-CM

## 2022-07-29 DIAGNOSIS — Z7901 Long term (current) use of anticoagulants: Secondary | ICD-10-CM

## 2022-08-02 ENCOUNTER — Other Ambulatory Visit (HOSPITAL_COMMUNITY): Payer: 59

## 2022-08-03 ENCOUNTER — Ambulatory Visit (HOSPITAL_COMMUNITY): Payer: Self-pay | Admitting: Pharmacist

## 2022-08-03 ENCOUNTER — Ambulatory Visit (HOSPITAL_COMMUNITY)
Admission: RE | Admit: 2022-08-03 | Discharge: 2022-08-03 | Disposition: A | Discharge status: Home / Self Care | Payer: 59 | Source: Ambulatory Visit | Attending: Cardiology | Admitting: Cardiology

## 2022-08-03 DIAGNOSIS — Z7901 Long term (current) use of anticoagulants: Secondary | ICD-10-CM | POA: Insufficient documentation

## 2022-08-03 DIAGNOSIS — Z95811 Presence of heart assist device: Secondary | ICD-10-CM | POA: Insufficient documentation

## 2022-08-03 LAB — PROTIME-INR
INR: 4 — ABNORMAL HIGH (ref 0.8–1.2)
Prothrombin Time: 38.7 seconds — ABNORMAL HIGH (ref 11.4–15.2)

## 2022-08-03 NOTE — Progress Notes (Signed)
LVAD INR 

## 2022-08-03 NOTE — Progress Notes (Signed)
Patient presents for dressing change and INR in VAD Clinic today.  Exit Site Care: Drive line is being maintained by VAD coordinators. Existing VAD dressing removed and site care performed using sterile technique. Drive line exit site cleaned with CHG swabs x 2 and rinsed with saline, allowed to dry, and Sorbaview dressing with silverlon patch applied. Anchor reapplied and covered with two large tegaderms. Drive line exit site incorporated, the velour is fully implanted at exit site. No erythema, tenderness, drainage, or foul odor noted.  Pt denies fever or chills. Continue weekly dressing changes. VAD coordinators to perform weekly dressing changes. Patient has sufficient kits at home.  Plan: Return to clinic 10 days for dressing change Coumadin dosing per Lauren PharmD  Bobbye Morton RN,BSN [place] Coordinator  Office: [phone]  24/7 Pager: [phone]

## 2022-08-11 ENCOUNTER — Other Ambulatory Visit (HOSPITAL_COMMUNITY): Payer: Self-pay

## 2022-08-11 ENCOUNTER — Ambulatory Visit (HOSPITAL_COMMUNITY)
Admission: RE | Admit: 2022-08-11 | Discharge: 2022-08-11 | Disposition: A | Discharge status: Home / Self Care | Payer: 59 | Source: Ambulatory Visit | Attending: Internal Medicine | Admitting: Internal Medicine

## 2022-08-11 ENCOUNTER — Ambulatory Visit (HOSPITAL_COMMUNITY): Payer: Self-pay | Admitting: Pharmacist

## 2022-08-11 ENCOUNTER — Encounter (HOSPITAL_COMMUNITY): Payer: Self-pay | Admitting: Internal Medicine

## 2022-08-11 VITALS — BP 118/0 | Wt 208.2 lb

## 2022-08-11 DIAGNOSIS — K859 Acute pancreatitis without necrosis or infection, unspecified: Secondary | ICD-10-CM | POA: Diagnosis not present

## 2022-08-11 DIAGNOSIS — Z95811 Presence of heart assist device: Secondary | ICD-10-CM | POA: Diagnosis not present

## 2022-08-11 DIAGNOSIS — K0889 Other specified disorders of teeth and supporting structures: Secondary | ICD-10-CM | POA: Diagnosis not present

## 2022-08-11 DIAGNOSIS — K852 Alcohol induced acute pancreatitis without necrosis or infection: Secondary | ICD-10-CM

## 2022-08-11 DIAGNOSIS — I10 Essential (primary) hypertension: Secondary | ICD-10-CM

## 2022-08-11 DIAGNOSIS — I5022 Chronic systolic (congestive) heart failure: Secondary | ICD-10-CM

## 2022-08-11 DIAGNOSIS — Z720 Tobacco use: Secondary | ICD-10-CM

## 2022-08-11 DIAGNOSIS — R1011 Right upper quadrant pain: Secondary | ICD-10-CM | POA: Insufficient documentation

## 2022-08-11 LAB — HEPATIC FUNCTION PANEL
ALT: 27 U/L (ref 0–44)
AST: 20 U/L (ref 15–41)
Albumin: 3.4 g/dL — ABNORMAL LOW (ref 3.5–5.0)
Alkaline Phosphatase: 111 U/L (ref 38–126)
Bilirubin, Direct: 1.4 mg/dL — ABNORMAL HIGH (ref 0.0–0.2)
Indirect Bilirubin: 1.5 mg/dL — ABNORMAL HIGH (ref 0.3–0.9)
Total Bilirubin: 2.9 mg/dL — ABNORMAL HIGH (ref 0.3–1.2)
Total Protein: 7.9 g/dL (ref 6.5–8.1)

## 2022-08-11 LAB — CBC
HCT: 38.1 % — ABNORMAL LOW (ref 39.0–52.0)
Hemoglobin: 12.6 g/dL — ABNORMAL LOW (ref 13.0–17.0)
MCH: 28.8 pg (ref 26.0–34.0)
MCHC: 33.1 g/dL (ref 30.0–36.0)
MCV: 87 fL (ref 80.0–100.0)
Platelets: 184 10*3/uL (ref 150–400)
RBC: 4.38 MIL/uL (ref 4.22–5.81)
RDW: 13.5 % (ref 11.5–15.5)
WBC: 13.3 10*3/uL — ABNORMAL HIGH (ref 4.0–10.5)
nRBC: 0 % (ref 0.0–0.2)

## 2022-08-11 LAB — PROTIME-INR
INR: 3 — ABNORMAL HIGH (ref 0.8–1.2)
Prothrombin Time: 31 seconds — ABNORMAL HIGH (ref 11.4–15.2)

## 2022-08-11 LAB — LIPASE, BLOOD: Lipase: 61 U/L — ABNORMAL HIGH (ref 11–51)

## 2022-08-11 LAB — BASIC METABOLIC PANEL
Anion gap: 13 (ref 5–15)
BUN: 10 mg/dL (ref 6–20)
CO2: 24 mmol/L (ref 22–32)
Calcium: 8.5 mg/dL — ABNORMAL LOW (ref 8.9–10.3)
Chloride: 98 mmol/L (ref 98–111)
Creatinine, Ser: 1.01 mg/dL (ref 0.61–1.24)
GFR, Estimated: >60 mL/min (ref >60)
Glucose, Bld: 142 mg/dL — ABNORMAL HIGH (ref 70–99)
Potassium: 3.2 mmol/L — ABNORMAL LOW (ref 3.5–5.1)
Sodium: 135 mmol/L (ref 135–145)

## 2022-08-11 LAB — TRIGLYCERIDES: Triglycerides: 88 mg/dL (ref <150)

## 2022-08-11 LAB — AMYLASE: Amylase: 136 U/L — ABNORMAL HIGH (ref 28–100)

## 2022-08-11 NOTE — Patient Instructions (Signed)
[place] diet no fatty foods Encourage hydration RUQ ultrasound today at 1:30 Melatonin for sleep

## 2022-08-11 NOTE — Progress Notes (Signed)
Patient presents for a sick visit in [place] [facility] clinic today alone after paging the VAD Coordinator this morning reporting severe stomach pain that started Sunday morning. Patient states it started in his RUQ and progressed into periumbilical pain. Patient endorses nausea but no vomiting other than some dry heaving last night. Patient able to eat small amount of chicken soup last night but otherwise has not had any appetite since Sunday.   Dr. [name] saw patient in clinic and ordered a STAT RUQ ultrasound and scheduled for today at 1330 at [facility]. MD advised for patient to resume a bland diet, continue to liberalize hydration and avoid fatty foods. If symptoms worsen patient advised to notify VAD Coordinator.   RUQ IMPRESSION: 1. Normal gallbladder. 2. Slight heterogeneous echotexture of liver noted. This is nonspecific but can be seen in fatty infiltration of liver.   Vital Signs: Doppler: 118 Automatic BP: 108/94 (100) HR: 110 SPO2: 99%   Weight: 208.2 lbs w/ eqt Last wt: 205.4 lbs w/ equip   VAD Indication: Destination Therapy    VAD interrogation & Equipment Management: Speed: 6500 Flow: 5.3 Power: 6.1w    PI: 2.7 Hct: 39  Alarms: few LV alarms Events: 20-30 daily  Primary Controller:  Replace back up battery in 31 months Back up controller: did not bring  Annual Equipment Maintenance on UBC/PM was performed 09/16/21   I reviewed the LVAD parameters from today and compared the results to the patient's prior recorded data.  LVAD interrogation was NEGATIVE for significant power changes, NEGATIVE for clinical alarms and STABLE for PI events/speed drops. No programming changes were made and pump is functioning within specified parameters.   Exit Site Care: Drive line is being maintained weekly by VAD coordinators. Existing VAD dressing removed and site care performed using sterile technique. Drive line exit site cleaned with Chlora prep applicators x 2, RINSED WITH SALINE allowed  to dry, and Sorbaview dressing with Silverlon patch reapplied. Anchor reapplied. Covered with 2 large tegaderms. Drive line exit site incorporated, the velour is fully implanted at exit site. No drainage, erythema, tenderness, or foul odor noted.  Pt denies fever or chills. Continue weekly dressing changes. VAD coordinators to perform weekly dressing changes. Pt has adequate dressing supplies at home.   Device: none  BP & Labs:  Doppler 118 - reflecting MAP   Hgb 12.6 - No S/S of bleeding. Specifically denies melena/BRBPR or nosebleeds.    Latest Reference Range & Units 08/11/22 10:04 08/11/22 10:05  Amylase, Serum 28 - 100 U/L 136 (H)   Lipase 11 - 51 U/L  61 (H)  AST 15 - 41 U/L 20   ALT 0 - 44 U/L 27   Total Protein 6.5 - 8.1 g/dL 7.9   Bilirubin, Direct 0.0 - 0.2 mg/dL 1.4 (H)   Indirect Bilirubin 0.3 - 0.9 mg/dL 1.5 (H)   Total Bilirubin 0.3 - 1.2 mg/dL 2.9 (H)   Triglycerides 150mg /dL  88   Patient Instructions:  Follow bland diet and avoid fatty foods Encourage hydration RUQ ultrasound today at 1:30 at [facility] for sleep  Bobbye Morton RN,BSN [place] Coordinator  Office: [phone]  24/7 Pager: [phone]

## 2022-08-11 NOTE — Progress Notes (Incomplete)
VAD Clinic Note    ID:  Jonathan Wood Apr 12, 1971, MRN [id number]  Location: Home  Provider location: [facility] Advanced Heart Failure Type of Visit: Established patient  PCP: [facility]  Cardiologist:  [name], MD Primary HF: Dr [name]   Chief Complaint: Heart Failure/LVAD  History of Present Illness:  Jonathan Wood is a 52 y.o. 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  Admitted 4/20 with volume overload and anemia. Diuresed with IV lasix and transitioned to torsemide 20 mg daily. GI consulted EGD with gastritis and nonbleeding duodenal ulcers, colonoscopy with 5 polyps removed.  Admitted 12/20 for DL infection with overlying cellulitis and volume overload.   Admitted 4/21 for DL infection with large subxiphoid abscess. Underwent several debridements and eventually a pec muscle flap. Wound cx + MSSA. Initially on ancef and rifabutin but expanded to cefipime then changed to IV ancef and po rifabutin. Now on oral keflex.  Readmitted 6/21 with superficial abscess and tachypnea. Superficial abscess I&D'd and packed. CT scan done and showed possible deeper fluid collection. Reviewed with TCTS who felt it was muscle flap. Sent to IR for possible drainage but u/s confirmed it was the muscle flap and no drainable fluid collection. Wound cx negative. ID saw and recommended continuing Keflex.   Admitted 8/21 with lactic acidosis due to cardiogenic shock. Started on 0.25 of milrinone and NE. Ramp echo on 8/13 with severe RV dysfunction. Speed increased 5600-> 6300. Tolerated ok. Repeat ramp echo on 8/20 speed increased to 6500. Diuresed over 40 pounds.   Admitted 10/21 with  ETOH pancreatitis. Gallbladder ok. Resolved with medical management    Presents today for unscheduled visit due to concern over recurrent pancreatitis. Over past few days has had some RUQ pain  associated with nausea and poor appetite. Says it happened acutely after a meal a few days ago and feels it is slowly getting better but still sore. Denies trauma or ETOH use. Denies orthopnea or PND. No fevers, chills or problems with driveline. No bleeding, melena or neuro symptoms. No VAD alarms. Taking all meds as prescribed.     VAD Indication: Destination Therapy    VAD interrogation & Equipment Management: Speed: 6500 Flow: 5.3 Power: 6.1w    PI: 2.7 Hct: 39   Alarms: few LV alarms Events: 20-30 daily   Primary Controller:  Replace back up battery in 31 months Back up controller: did not bring   Annual Equipment Maintenance on UBC/PM was performed 09/16/21    I reviewed the LVAD parameters from today and compared the results to the patient's prior recorded data.  LVAD interrogation was NEGATIVE for significant power changes, NEGATIVE for clinical alarms and STABLE for PI events/speed drops. No programming changes were made and pump is functioning within specified parameters. hin specified parameters.  Past Medical History:  Diagnosis Date   Asthma    CHF (congestive heart failure) ([facility])    a. 09/2016: EF 20-25% with cath showing normal cors   GERD (gastroesophageal reflux disease)    History of hiatal hernia    LVAD (left ventricular assist device) present (H[facility])    OSA on CPAP 09/06/2018   Severe OSA with AHI 68/hr on CPAP at 12cm H2O   Past Surgical History:  Procedure Laterality Date   APPLICATION OF A-CELL OF CHEST/ABDOMEN N/A 08/24/2019   Procedure: Application Of A-Cell Of Chest/Abdomen;  Surgeon:  Ivin Poot, MD;  Location: [facility];  Service: Thoracic;  Laterality: N/A;   APPLICATION OF A-CELL OF CHEST/ABDOMEN N/A 09/05/2019   Procedure: Application Of A-Cell Of Chest/Abdomen;  Surgeon: Wallace Going, DO;  Location: [facility];  Service: Plastics;  Laterality: N/A;   APPLICATION OF A-CELL OF CHEST/ABDOMEN  09/03/2019   Procedure: Application Of A-Cell Of  Chest/Abdomen;  Surgeon: Ivin Poot, MD;  Location: [facility] [facility];  Service: Thoracic;;   APPLICATION OF WOUND VAC N/A 08/22/2019   Procedure: Debridement, Irrigation and Packing of Abdominal Incision.;  Surgeon: Ivin Poot, MD;  Location: [facility];  Service: Thoracic;  Laterality: N/A;   APPLICATION OF WOUND VAC N/A 08/24/2019   Procedure: Irrigation and Debridement with WOUND VAC APPLICATION;  Surgeon: Ivin Poot, MD;  Location: [facility];  Service: Thoracic;  Laterality: N/A;   APPLICATION OF WOUND VAC N/A 08/30/2019   Procedure: APPLICATION OF WOUND VAC;  Surgeon: Ivin Poot, MD;  Location: [facility];  Service: Thoracic;  Laterality: N/A;   APPLICATION OF WOUND VAC N/A 09/03/2019   Procedure: WOUND VAC CHANGE;  Surgeon: Ivin Poot, MD;  Location: [facility];  Service: Thoracic;  Laterality: N/A;   BIOPSY  08/23/2018   Procedure: BIOPSY;  Surgeon: Irving Copas., MD;  Location: [facility];  Service: Gastroenterology;;   COLONOSCOPY N/A 08/23/2018   Procedure: COLONOSCOPY;  Surgeon: Irving Copas., MD;  Location: [facility];  Service: Gastroenterology;  Laterality: N/A;   ENTEROSCOPY N/A 08/23/2018   Procedure: ENTEROSCOPY;  Surgeon: Rush Landmark Telford Nab., MD;  Location: [facility];  Service: Gastroenterology;  Laterality: N/A;   HEMOSTASIS CLIP PLACEMENT  08/23/2018   Procedure: HEMOSTASIS CLIP PLACEMENT;  Surgeon: Irving Copas., MD;  Location: [facility];  Service: Gastroenterology;;   IABP INSERTION N/A 09/05/2017   Procedure: IABP INSERTION;  Surgeon: Jolaine Artist, MD;  Location: [facility];  Service: Cardiovascular;  Laterality: N/A;   INSERTION OF IMPLANTABLE LEFT VENTRICULAR ASSIST DEVICE N/A 09/06/2017   Procedure: INSERTION OF IMPLANTABLE LEFT VENTRICULAR ASSIST DEVICE - HM3;  Surgeon: Ivin Poot, MD;  Location: [facility] [place];  Service: Open Heart Surgery;  Laterality: N/A;  HM3   MUSCLE FLAP CLOSURE N/A 09/05/2019   Procedure: MUSCLE FLAP  CLOSURE;  Surgeon: Wallace Going, DO;  Location: [facility];  Service: Plastics;  Laterality: N/A;   NASAL FRACTURE SURGERY  1987   POLYPECTOMY  08/23/2018   Procedure: POLYPECTOMY;  Surgeon: Mansouraty, Telford Nab., MD;  Location: [facility];  Service: Gastroenterology;;   RIGHT HEART CATH N/A 08/30/2017   Procedure: RIGHT HEART CATH;  Surgeon: Jolaine Artist, MD;  Location: [facility];  Service: Cardiovascular;  Laterality: N/A;   RIGHT/LEFT HEART CATH AND CORONARY ANGIOGRAPHY N/A 10/08/2016   Procedure: Right/Left Heart Cath and Coronary Angiography;  Surgeon: Dixie Dials, MD;  Location: [facility];  Service: Cardiovascular;  Laterality: N/A;   STERNAL WOUND DEBRIDEMENT N/A 08/20/2019   Procedure: DEBRIDEMENT OF LVAD DRIVELINE TUNNEL;  Surgeon: Ivin Poot, MD;  Location: [facility];  Service: Thoracic;  Laterality: N/A;   STERNAL WOUND DEBRIDEMENT N/A 09/05/2019   Procedure: DEBRIDEMENT AND CLOSURE OF ABDOMINAL WOUND;  Surgeon: Wallace Going, DO;  Location: [facility];  Service: Plastics;  Laterality: N/A;   SUBMUCOSAL TATTOO INJECTION  08/23/2018   Procedure: SUBMUCOSAL TATTOO INJECTION;  Surgeon: Irving Copas., MD;  Location: [facility];  Service: Gastroenterology;;   TEE WITHOUT CARDIOVERSION N/A 09/06/2017  Procedure: TRANSESOPHAGEAL ECHOCARDIOGRAM (TEE);  Surgeon: Prescott Gum, Collier Salina, MD;  Location: [facility];  Service: Open Heart Surgery;  Laterality: N/A;   WOUND DEBRIDEMENT N/A 08/30/2019   Procedure: Debridement Abdominal Wound;  Surgeon: Ivin Poot, MD;  Location: [facility];  Service: Thoracic;  Laterality: N/A;     Current Outpatient Medications  Medication Sig Dispense Refill   acetaminophen (TYLENOL) 500 MG tablet Take 1,500 mg by mouth 3 (three) times daily as needed for headache (pain).     albuterol (PROAIR HFA) 108 (90 Base) MCG/ACT inhaler Inhale 2 puffs into the lungs every 6 (six) hours as needed for wheezing or shortness of breath. (Patient  not taking: Reported on 11/11/2021) 1 each 6   cephALEXin (KEFLEX) 500 MG capsule Take 1 capsule (500 mg total) by mouth 3 (three) times daily. 90 capsule 11   doxazosin (CARDURA) 2 MG tablet TAKE 1/2 TABLET BY MOUTH AT BEDTIME 45 tablet 1   enoxaparin (LOVENOX) 40 MG/0.4ML injection Inject 0.4 mLs (40 mg total) into the skin every 12 (twelve) hours. (Patient not taking: Reported on 01/27/2022) 4 mL 0   ENTRESTO 97-103 MG TAKE 1 TABLET BY MOUTH TWICE DAILY 60 tablet 6   gabapentin (NEURONTIN) 300 MG capsule TAKE ONE CAPSULE BY MOUTH THREE TIMES DAILY AT 9am, 3pm, AND 9pm 90 capsule 5   nicotine (NICODERM CQ - DOSED IN MG/24 HOURS) 21 mg/24hr patch Place 1 patch (21 mg total) onto the skin daily. (Patient not taking: Reported on 04/29/2021) 28 patch 3   nicotine polacrilex (NICORETTE) 4 MG gum Take 1 each (4 mg total) by mouth as needed for smoking cessation. 100 tablet 3   pantoprazole (PROTONIX) 40 MG tablet Take 1 tablet (40 mg total) by mouth 2 (two) times daily. 180 tablet 3   sildenafil (REVATIO) 20 MG tablet Take 1 tablet (20 mg total) by mouth 3 (three) times daily. 90 tablet 11   sildenafil (VIAGRA) 100 MG tablet Take 1 tablet (100 mg total) by mouth daily as needed for erectile dysfunction. 10 tablet 5   spironolactone (ALDACTONE) 25 MG tablet Take 0.5 tablets (12.5 mg total) by mouth daily. 30 tablet 6   torsemide (DEMADEX) 20 MG tablet Take 1 tablet (20 mg total) by mouth every other day. (Patient not taking: Reported on 06/26/2020) 90 tablet 3   traMADol HCl 100 MG TABS Take 100 mg by mouth every 8 (eight) hours as needed. (Patient not taking: Reported on 09/07/2021) 10 tablet 0   warfarin (COUMADIN) 5 MG tablet Take 1.5 tablets (7.5 mg total) by mouth daily at 4 PM.  7.5 mg (1.5 tab) all days OR as directed by HF clinic 90 tablet 5   No current facility-administered medications for this encounter.    Allergies:   Patient has no known allergies.   Social History:  The patient  reports  that he has quit smoking. His smoking use included cigarettes. He has a 12.50 pack-year smoking history. He has never used smokeless tobacco. He reports that he does not currently use alcohol. He reports that he does not currently use drugs after having used the following drugs: Marijuana.   Family History:  The patient's family history includes Alcohol abuse in his cousin; Hypertension in his father.   ROS:  Please see the history of present illness.   All other systems are personally reviewed and negative.   Vital Signs: Doppler: 118 Automatic BP: 108/94 (100) HR: 110 SPO2: 99%   Weight: 208.2 lbs w/ eqt  Last wt: 205.4 lbs w/ equip   Exam: General:  NAD.  HEENT: normal  Neck: supple. JVP not elevated.  Carotids 2+ bilat; no bruits. No lymphadenopathy or thryomegaly appreciated. Cor: LVAD hum.  Lungs: Clear. Abdomen: obese soft, mildly tender in RUQ, non-distended. No hepatosplenomegaly. No bruits or masses. Good bowel sounds. Driveline site clean. Anchor in place.  Extremities: no cyanosis, clubbing, rash. Warm no edema  Neuro: alert & oriented x 3. No focal deficits. Moves all 4 without problem    Recent Labs: 04/21/2022: ALT 13 07/02/2022: BUN 10; Creatinine, Ser 0.93; Potassium 3.5; Sodium 138 08/11/2022: Hemoglobin 12.6; Platelets 184    Wt Readings from Last 3 Encounters:  08/11/22 94.4 kg (208 lb 3.2 oz)  07/02/22 95.5 kg (210 lb 9.6 oz)  04/21/22 92.9 kg (204 lb 12.8 oz)      ASSESSMENT AND PLAN:  1. RUQ ab pain - I susPect he Probably rolled  stone. Ab exam not severe- check labs and RUQ U/S - Follow closely - Lowfat diet. Avoid any ETOH  2. 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 Treated with milrinone and then weaned off.  Diuresed 40 pounds. VAD speed increased - He continues to do well from HF standpoint. NYHA I - Volume status ok - Continue Entresto and  spiro.  - We have recently been discussing transplant. He is interested but needs to stop smoking and have 6 months of negative cotinine tests. We again encouraged him to work on smoking cessation. Discussed again today  3. HM3 LVAD 09/06/2017.  - VAD interrogated personally. Parameters stable. - LDH pending - INR pending goal 2.0-2.5  Discussed dosing with PharmD personally. - Off ASA with PUD.  - DL site looks good - Controller and modular cable replaced recently. Look ok  4.  Essential HTN - Blood pressure well controlled. Continue current regimen.  5. H/o ETOH pancreatitis, 11/21 - remains quit from drinking (was stressed about his son) - see plan as above  7. Recurrent DL infection with subxiphoid abscess - s/p debridement and pec flap in 4/21 - remains on keflex. Site continues to look good - No fevers or chills  6.  OSA - sleep study with very severe OSA (AHI 69/hr) - He has refused therapy multiple times but seems to have improved with weight loss and ETOH cessation. We have offered repeat testing to see if OSA has improved but he refuses.  - No change  8.  H/o GI bleed  - 08/2018 EGD showed gastritis and nonbleeding duodenal ulcers, colonoscopy with 5 polyps removed.  - Remains on PPI. No recent GI bleeding - hgb pending  8. Tobacco Abuse  - continues to smoke a few cigs/day. No change - we discussed again that he needs to stop smoking to qualify for transplant.    Total time spent 40 minutes. Over half that time spent discussing above.    Signed, [name], MD  08/11/2022 11:17 AM  [facility] [address] [facility] [place] [address] [phone] (office) [phone] (fax)

## 2022-08-13 ENCOUNTER — Other Ambulatory Visit (HOSPITAL_COMMUNITY): Payer: Self-pay | Admitting: *Deleted

## 2022-08-13 DIAGNOSIS — Z7901 Long term (current) use of anticoagulants: Secondary | ICD-10-CM

## 2022-08-13 DIAGNOSIS — Z95811 Presence of heart assist device: Secondary | ICD-10-CM

## 2022-08-16 ENCOUNTER — Other Ambulatory Visit (HOSPITAL_COMMUNITY): Payer: 59

## 2022-08-25 ENCOUNTER — Other Ambulatory Visit (HOSPITAL_COMMUNITY): Payer: 59

## 2022-08-26 ENCOUNTER — Other Ambulatory Visit (HOSPITAL_COMMUNITY): Payer: 59

## 2022-08-27 ENCOUNTER — Ambulatory Visit (HOSPITAL_COMMUNITY): Payer: Self-pay

## 2022-08-27 ENCOUNTER — Ambulatory Visit (HOSPITAL_COMMUNITY)
Admission: RE | Admit: 2022-08-27 | Discharge: 2022-08-27 | Disposition: A | Discharge status: Home / Self Care | Payer: 59 | Source: Ambulatory Visit | Attending: Cardiology | Admitting: Cardiology

## 2022-08-27 DIAGNOSIS — Z95811 Presence of heart assist device: Secondary | ICD-10-CM | POA: Insufficient documentation

## 2022-08-27 DIAGNOSIS — Z4801 Encounter for change or removal of surgical wound dressing: Secondary | ICD-10-CM | POA: Insufficient documentation

## 2022-08-27 DIAGNOSIS — Z7901 Long term (current) use of anticoagulants: Secondary | ICD-10-CM | POA: Insufficient documentation

## 2022-08-27 LAB — PROTIME-INR
INR: 2.3 — ABNORMAL HIGH (ref 0.8–1.2)
Prothrombin Time: 25.4 seconds — ABNORMAL HIGH (ref 11.4–15.2)

## 2022-08-27 NOTE — Progress Notes (Signed)
Patient presents for dressing change and INR in VAD Clinic today.  Exit Site Care: Drive line is being maintained by VAD coordinators. Existing VAD dressing removed and site care performed using sterile technique. Drive line exit site cleaned with CHG swabs x 2 and rinsed with saline, allowed to dry, and Sorbaview dressing with silverlon patch applied. Anchor reapplied and covered with two large tegaderms. Drive line exit site incorporated, the velour is fully implanted at exit site. No erythema, tenderness, drainage, or foul odor noted.  Pt denies fever or chills. Continue weekly dressing changes. VAD coordinators to perform weekly dressing changes. Patient given 4 weekly kits today during visit and asked to bring one to clinic for each dressing change.  Plan: Return to clinic in a week for 2 month follow up with Dr. [name]. Coumadin dosing per Lauren PharmD  [name] RN,BSN VAD Coordinator  Office: [phone]  24/7 Pager: [phone]

## 2022-09-02 ENCOUNTER — Other Ambulatory Visit (HOSPITAL_COMMUNITY): Payer: Self-pay

## 2022-09-02 DIAGNOSIS — Z95811 Presence of heart assist device: Secondary | ICD-10-CM

## 2022-09-02 DIAGNOSIS — Z7901 Long term (current) use of anticoagulants: Secondary | ICD-10-CM

## 2022-09-03 ENCOUNTER — Encounter (HOSPITAL_COMMUNITY): Payer: Self-pay | Admitting: Internal Medicine

## 2022-09-03 ENCOUNTER — Ambulatory Visit (HOSPITAL_COMMUNITY)
Admission: RE | Admit: 2022-09-03 | Discharge: 2022-09-03 | Disposition: A | Discharge status: Home / Self Care | Payer: 59 | Source: Ambulatory Visit | Attending: Internal Medicine | Admitting: Internal Medicine

## 2022-09-03 ENCOUNTER — Ambulatory Visit (HOSPITAL_COMMUNITY): Payer: Self-pay | Admitting: Pharmacist

## 2022-09-03 VITALS — BP 128/72 | HR 76 | Wt 213.6 lb

## 2022-09-03 DIAGNOSIS — K859 Acute pancreatitis without necrosis or infection, unspecified: Secondary | ICD-10-CM | POA: Insufficient documentation

## 2022-09-03 DIAGNOSIS — G4733 Obstructive sleep apnea (adult) (pediatric): Secondary | ICD-10-CM | POA: Insufficient documentation

## 2022-09-03 DIAGNOSIS — F17200 Nicotine dependence, unspecified, uncomplicated: Secondary | ICD-10-CM | POA: Insufficient documentation

## 2022-09-03 DIAGNOSIS — I5022 Chronic systolic (congestive) heart failure: Secondary | ICD-10-CM | POA: Diagnosis not present

## 2022-09-03 DIAGNOSIS — I110 Hypertensive heart disease with heart failure: Secondary | ICD-10-CM | POA: Diagnosis present

## 2022-09-03 DIAGNOSIS — I10 Essential (primary) hypertension: Secondary | ICD-10-CM

## 2022-09-03 DIAGNOSIS — Z7901 Long term (current) use of anticoagulants: Secondary | ICD-10-CM

## 2022-09-03 DIAGNOSIS — D649 Anemia, unspecified: Secondary | ICD-10-CM | POA: Insufficient documentation

## 2022-09-03 DIAGNOSIS — Z8719 Personal history of other diseases of the digestive system: Secondary | ICD-10-CM | POA: Diagnosis not present

## 2022-09-03 DIAGNOSIS — Z9889 Other specified postprocedural states: Secondary | ICD-10-CM | POA: Diagnosis not present

## 2022-09-03 DIAGNOSIS — R1011 Right upper quadrant pain: Secondary | ICD-10-CM

## 2022-09-03 DIAGNOSIS — Z720 Tobacco use: Secondary | ICD-10-CM

## 2022-09-03 DIAGNOSIS — Z95811 Presence of heart assist device: Secondary | ICD-10-CM

## 2022-09-03 LAB — BASIC METABOLIC PANEL
Anion gap: 8 (ref 5–15)
BUN: 10 mg/dL (ref 6–20)
CO2: 22 mmol/L (ref 22–32)
Calcium: 8.5 mg/dL — ABNORMAL LOW (ref 8.9–10.3)
Chloride: 108 mmol/L (ref 98–111)
Creatinine, Ser: 1.05 mg/dL (ref 0.61–1.24)
GFR, Estimated: >60 mL/min (ref >60)
Glucose, Bld: 84 mg/dL (ref 70–99)
Potassium: 4.1 mmol/L (ref 3.5–5.1)
Sodium: 138 mmol/L (ref 135–145)

## 2022-09-03 LAB — PROTIME-INR
INR: 2.1 — ABNORMAL HIGH (ref 0.8–1.2)
Prothrombin Time: 23.6 seconds — ABNORMAL HIGH (ref 11.4–15.2)

## 2022-09-03 LAB — CBC
HCT: 38.3 % — ABNORMAL LOW (ref 39.0–52.0)
Hemoglobin: 12 g/dL — ABNORMAL LOW (ref 13.0–17.0)
MCH: 28.1 pg (ref 26.0–34.0)
MCHC: 31.3 g/dL (ref 30.0–36.0)
MCV: 89.7 fL (ref 80.0–100.0)
Platelets: 166 10*3/uL (ref 150–400)
RBC: 4.27 MIL/uL (ref 4.22–5.81)
RDW: 14 % (ref 11.5–15.5)
WBC: 4.6 10*3/uL (ref 4.0–10.5)
nRBC: 0 % (ref 0.0–0.2)

## 2022-09-03 LAB — LACTATE DEHYDROGENASE: LDH: 181 U/L (ref 98–192)

## 2022-09-03 MED ORDER — SPIRONOLACTONE 25 MG PO TABS
12.5000 mg | ORAL_TABLET | Freq: Every day | ORAL | 6 refills | Status: DC
Start: 2022-09-03 — End: 2022-11-10

## 2022-09-03 NOTE — Progress Notes (Addendum)
Patient presents for 2 month f/u with 5 year Intermacs in VAD clinic today alone. Denies any issues with VAD or equipment.   Pt states he is feeling great. He is working at [employer] and [employer]. 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 Denies lightheadedness, dizziness, falls, heart failure symptoms, and signs of bleeding. Abdominal pain has resolved.   Still smoking 3 - 4 cigarettes per day. He does not feel like the nicotine patch helps, and he does not like the taste of the Nicorette gum. He states he will continue to work on this. He is taking all medications as prescribed.   He is traveling to [facility] next weekend for a basketball tournament with his son. Provided with closest VAD center information. See documentation below.   Reminded pt to please bring equipment to his next clinic visit for annual maintenance.   Refill for [facility] sent to pt's pharmacy.   Vital Signs: Doppler: 140 Automatic BP: 128/72 (99) HR: 76 SPO2: 100%   Weight: 213.6 lbs w/ eqt Last wt: 210.6 lbs w/ equip   VAD Indication: Destination Therapy    VAD interrogation & Equipment Management: Speed: 6500 Flow: 5.4 Power: 5.8 w    PI: 3.5 Hct: 39  Alarms: none Events: 20-40 daily  Primary Controller:  Replace back up battery in 29 months Back up controller: did not bring  Annual Equipment Maintenance on UBC/PM was performed 09/16/21   I reviewed the LVAD parameters from today and compared the results to the patient's prior recorded data.  LVAD interrogation was NEGATIVE for significant power changes, NEGATIVE for clinical alarms and STABLE for PI events/speed drops. No programming changes were made and pump is functioning within specified parameters.   Exit Site Care: Drive line is being maintained weekly by VAD coordinators. Existing VAD dressing removed and site care performed using sterile technique. Drive line exit site cleaned with Chlora prep applicators x 2, RINSED WITH  SALINE allowed to dry, and Sorbaview dressing with Silverlon patch reapplied. Anchor reapplied. Covered with 2 large tegaderms. Drive line exit site incorporated, the velour is fully implanted at exit site. No drainage, erythema, tenderness, or foul odor noted.  Pt denies fever or chills. Continue weekly dressing changes. VAD coordinators to perform weekly dressing changes. Pt provided with 4 weekly kits, 4 anchors, and 4 large tegaderms for home use if needed in between VAD coordinator dressing changes in clinic.    Device: none  BP & Labs:  Doppler 140 - reflecting modified systolic   Hgb 12.0 - No S/S of bleeding. Specifically denies melena/BRBPR or nosebleeds.    LDH 181- established baseline of 200-300. Denies tea-colored urine. No power elevations noted on interrogation.  5 year Intermacs follow up completed including:  Quality of Life, KCCQ-12, and Neurocognitive trail making.   Pt completed 1650 feet during 6 minute walk.  Back up controller: Not present  Patient Goals: To completely stop smoking in order to be eligible for transplant.   [place] Cardiomyopathy Questionnaire     09/03/2022   11:58 AM 04/21/2022    2:36 PM 09/07/2021   11:49 AM  KCCQ-12  1 a. Ability to shower/bathe Not at all limited Not at all limited Not at all limited  1 b. Ability to walk 1 block Not at all limited Not at all limited Not at all limited  1 c. Ability to hurry/jog Not at all limited Slightly limited Not at all limited  2. Edema feet/ankles/legs Never over the  past 2 weeks Never over the past 2 weeks Never over the past 2 weeks  3. Limited by fatigue Never over the past 2 weeks Never over the past 2 weeks Less than once a week  4. Limited by dyspnea Never over the past 2 weeks Never over the past 2 weeks Never over the past 2 weeks  5. Sitting up / on 3+ pillows Never over the past 2 weeks Never over the past 2 weeks Never over the past 2 weeks  6. Limited enjoyment of life Not limited at  all Slightly limited Slightly limited  7. Rest of life w/ symptoms Completely satisfied Completely satisfied Completely satisfied  8 a. Participation in hobbies Did not limit at all N/A, did not do for other reasons Slightly limited  8 b. Participation in chores Did not limit at all N/A, did not do for other reasons Slightly limited  8 c. Visiting family/friends Did not limit at all N/A, did not do for other reasons Did not limit at all     Patient Instructions:  No medication changes today Coumadin dosing per Lauren PharmD Return to VAD clinic in 10 days for dressing change Return to VAD clinic in 2 months for follow up with Dr [name]. Please bring your MPU, battery charger, and black bag to your next clinic appt for annual maintenance Closest VAD center to [place], [place]:  [facility] [place] [facility] [place] [phone] [address] [place], [place], [address] (7.02 miles)  Alyce Pagan RN VAD Coordinator  Office: [phone]  24/7 Pager: [phone]

## 2022-09-03 NOTE — Patient Instructions (Addendum)
No medication changes today Coumadin dosing per Lauren PharmD Return to VAD clinic in 1 week for dressing change Return to VAD clinic in 2 months for follow up with Dr [name]. Please bring your MPU, battery charger, and black bag to your next clinic appt for annual maintenance Closest VAD center to [place], [place]:  [facility] [place] [facility] [phone] [address], [place], [place], [address] (7.02 miles)

## 2022-09-05 NOTE — Progress Notes (Signed)
VAD Clinic Note    ID:  [name], [name] 12/27/1970, MRN [id number]  Location: Home  Provider location: [place] [facility] Type of Visit: Established patient  PCP: [name]  Cardiologist:  Arvilla Meres, MD Primary HF: Dr [name]   Chief Complaint: Heart Failure/LVAD  History of Present Illness:  Jonathan Wood is a 52 y.o. 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  Admitted 4/20 with volume overload and anemia. Diuresed with IV lasix and transitioned to torsemide 20 mg daily. GI consulted EGD with gastritis and nonbleeding duodenal ulcers, colonoscopy with 5 polyps removed.  Admitted 12/20 for DL infection with overlying cellulitis and volume overload.   Admitted 4/21 for DL infection with large subxiphoid abscess. Underwent several debridements and eventually a pec muscle flap. Wound cx + MSSA. Initially on ancef and rifabutin but expanded to cefipime then changed to IV ancef and po rifabutin. Now on oral keflex.  Readmitted 6/21 with superficial abscess and tachypnea. Superficial abscess I&D'd and packed. CT scan done and showed possible deeper fluid collection. Reviewed with TCTS who felt it was muscle flap. Sent to IR for possible drainage but u/s confirmed it was the muscle flap and no drainable fluid collection. Wound cx negative. ID saw and recommended continuing Keflex.   Admitted 8/21 with lactic acidosis due to cardiogenic shock. Started on 0.25 of milrinone and NE. Ramp echo on 8/13 with severe RV dysfunction. Speed increased 5600-> 6300. Tolerated ok. Repeat ramp echo on 8/20 speed increased to 6500. Diuresed over 40 pounds.   Admitted 10/21 with  ETOH pancreatitis. Gallbladder ok. Resolved with medical management    HE is here for routine f/u. At last visit was having some abdominal symptoms with concern for recurrent pancreatitis but  symptoms relatively mild and w/u suggesting perhaps passed gallstone. Now completely resolved. Feels great. 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 Still smoking a few cigs per day. No ETOH. Denies orthopnea or PND. No fevers, chills or problems with driveline. No bleeding, melena or neuro symptoms. No VAD alarms. Taking all meds as prescribed.   VAD Indication: Destination Therapy    VAD interrogation & Equipment Management: Speed: 6500 Flow: 5.4 Power: 5.8 w    PI: 3.5 Hct: 39   Alarms: none Events: 20-40 daily   Primary Controller:  Replace back up battery in 29 months Back up controller: did not bring   Annual Equipment Maintenance on UBC/PM was performed 09/16/21    I reviewed the LVAD parameters from today and compared the results to the patient's prior recorded data.  LVAD interrogation was NEGATIVE for significant power changes, NEGATIVE for clinical alarms and STABLE for PI events/speed drops. No programming changes were made and pump is functioning within specified parameters.   Past Medical History:  Diagnosis Date   Asthma    CHF (congestive heart failure)    a. 09/2016: EF 20-25% with cath showing normal cors   GERD (gastroesophageal reflux disease)    History of hiatal hernia    LVAD (left ventricular assist device) present    OSA on CPAP 09/06/2018   Severe OSA with AHI 68/hr on CPAP at 12cm H2O   Past Surgical History:  Procedure Laterality Date   APPLICATION OF A-CELL OF CHEST/ABDOMEN N/A 08/24/2019   Procedure: Application Of A-Cell Of Chest/Abdomen;  Surgeon: Donata Clay,  Theron Arista, MD;  Location: [facility] OR;  Service: Thoracic;  Laterality: N/A;   APPLICATION OF A-CELL OF CHEST/ABDOMEN N/A 09/05/2019   Procedure: Application Of A-Cell Of Chest/Abdomen;  Surgeon: Peggye Form, DO;  Location: [facility] OR;  Service: Plastics;  Laterality: N/A;   APPLICATION OF A-CELL OF CHEST/ABDOMEN  09/03/2019   Procedure: Application Of A-Cell Of  Chest/Abdomen;  Surgeon: Kerin Perna, MD;  Location: [facility] OR;  Service: Thoracic;;   APPLICATION OF WOUND VAC N/A 08/22/2019   Procedure: Debridement, Irrigation and Packing of Abdominal Incision.;  Surgeon: Kerin Perna, MD;  Location: [facility] OR;  Service: Thoracic;  Laterality: N/A;   APPLICATION OF WOUND VAC N/A 08/24/2019   Procedure: Irrigation and Debridement with WOUND VAC APPLICATION;  Surgeon: Kerin Perna, MD;  Location: [facility] OR;  Service: Thoracic;  Laterality: N/A;   APPLICATION OF WOUND VAC N/A 08/30/2019   Procedure: APPLICATION OF WOUND VAC;  Surgeon: Kerin Perna, MD;  Location: [facility] OR;  Service: Thoracic;  Laterality: N/A;   APPLICATION OF WOUND VAC N/A 09/03/2019   Procedure: WOUND VAC CHANGE;  Surgeon: Kerin Perna, MD;  Location: [facility] OR;  Service: Thoracic;  Laterality: N/A;   BIOPSY  08/23/2018   Procedure: BIOPSY;  Surgeon: Lemar Lofty., MD;  Location: [facility] ENDOSCOPY;  Service: Gastroenterology;;   COLONOSCOPY N/A 08/23/2018   Procedure: COLONOSCOPY;  Surgeon: Lemar Lofty., MD;  Location: [facility] ENDOSCOPY;  Service: Gastroenterology;  Laterality: N/A;   ENTEROSCOPY N/A 08/23/2018   Procedure: ENTEROSCOPY;  Surgeon: Meridee Score Netty Starring., MD;  Location: [facility] ENDOSCOPY;  Service: Gastroenterology;  Laterality: N/A;   HEMOSTASIS CLIP PLACEMENT  08/23/2018   Procedure: HEMOSTASIS CLIP PLACEMENT;  Surgeon: Lemar Lofty., MD;  Location: [facility] ENDOSCOPY;  Service: Gastroenterology;;   IABP INSERTION N/A 09/05/2017   Procedure: IABP INSERTION;  Surgeon: Dolores Patty, MD;  Location: [facility] INVASIVE CV LAB;  Service: Cardiovascular;  Laterality: N/A;   INSERTION OF IMPLANTABLE LEFT VENTRICULAR ASSIST DEVICE N/A 09/06/2017   Procedure: INSERTION OF IMPLANTABLE LEFT VENTRICULAR ASSIST DEVICE - HM3;  Surgeon: Kerin Perna, MD;  Location: [facility] OR;  Service: Open Heart Surgery;  Laterality: N/A;  HM3   MUSCLE FLAP CLOSURE N/A 09/05/2019   Procedure: MUSCLE FLAP  CLOSURE;  Surgeon: Peggye Form, DO;  Location: [facility] OR;  Service: Plastics;  Laterality: N/A;   NASAL FRACTURE SURGERY  1987   POLYPECTOMY  08/23/2018   Procedure: POLYPECTOMY;  Surgeon: Mansouraty, Netty Starring., MD;  Location: [facility] ENDOSCOPY;  Service: Gastroenterology;;   RIGHT HEART CATH N/A 08/30/2017   Procedure: RIGHT HEART CATH;  Surgeon: Dolores Patty, MD;  Location: [facility] INVASIVE CV LAB;  Service: Cardiovascular;  Laterality: N/A;   RIGHT/LEFT HEART CATH AND CORONARY ANGIOGRAPHY N/A 10/08/2016   Procedure: Right/Left Heart Cath and Coronary Angiography;  Surgeon: Orpah Cobb, MD;  Location: [facility] INVASIVE CV LAB;  Service: Cardiovascular;  Laterality: N/A;   STERNAL WOUND DEBRIDEMENT N/A 08/20/2019   Procedure: DEBRIDEMENT OF LVAD DRIVELINE TUNNEL;  Surgeon: Kerin Perna, MD;  Location: [facility] OR;  Service: Thoracic;  Laterality: N/A;   STERNAL WOUND DEBRIDEMENT N/A 09/05/2019   Procedure: DEBRIDEMENT AND CLOSURE OF ABDOMINAL WOUND;  Surgeon: Peggye Form, DO;  Location: [facility] OR;  Service: Plastics;  Laterality: N/A;   SUBMUCOSAL TATTOO INJECTION  08/23/2018   Procedure: SUBMUCOSAL TATTOO INJECTION;  Surgeon: Lemar Lofty., MD;  Location: [facility] ENDOSCOPY;  Service: Gastroenterology;;   TEE WITHOUT CARDIOVERSION N/A 09/06/2017   Procedure:  TRANSESOPHAGEAL ECHOCARDIOGRAM (TEE);  Surgeon: Donata Clay, Theron Arista, MD;  Location: [facility] OR;  Service: Open Heart Surgery;  Laterality: N/A;   WOUND DEBRIDEMENT N/A 08/30/2019   Procedure: Debridement Abdominal Wound;  Surgeon: Kerin Perna, MD;  Location: [facility] OR;  Service: Thoracic;  Laterality: N/A;     Current Outpatient Medications  Medication Sig Dispense Refill   acetaminophen (TYLENOL) 500 MG tablet Take 1,500 mg by mouth 3 (three) times daily as needed for headache (pain).     cephALEXin (KEFLEX) 500 MG capsule Take 1 capsule (500 mg total) by mouth 3 (three) times daily. 90 capsule 11   doxazosin (CARDURA) 2 MG tablet TAKE 1/2  TABLET BY MOUTH AT BEDTIME 45 tablet 1   ENTRESTO 97-103 MG TAKE 1 TABLET BY MOUTH TWICE DAILY 60 tablet 6   gabapentin (NEURONTIN) 300 MG capsule TAKE ONE CAPSULE BY MOUTH THREE TIMES DAILY AT 9am, 3pm, AND 9pm 90 capsule 5   nicotine (NICODERM CQ - DOSED IN MG/24 HOURS) 21 mg/24hr patch Place 1 patch (21 mg total) onto the skin daily. 28 patch 3   pantoprazole (PROTONIX) 40 MG tablet Take 1 tablet (40 mg total) by mouth 2 (two) times daily. 180 tablet 3   sildenafil (REVATIO) 20 MG tablet Take 1 tablet (20 mg total) by mouth 3 (three) times daily. 90 tablet 11   sildenafil (VIAGRA) 100 MG tablet Take 1 tablet (100 mg total) by mouth daily as needed for erectile dysfunction. 10 tablet 5   warfarin (COUMADIN) 5 MG tablet Take 1.5 tablets (7.5 mg total) by mouth daily at 4 PM.  7.5 mg (1.5 tab) all days OR as directed by HF clinic 90 tablet 5   albuterol (PROAIR HFA) 108 (90 Base) MCG/ACT inhaler Inhale 2 puffs into the lungs every 6 (six) hours as needed for wheezing or shortness of breath. (Patient not taking: Reported on 11/11/2021) 1 each 6   enoxaparin (LOVENOX) 40 MG/0.4ML injection Inject 0.4 mLs (40 mg total) into the skin every 12 (twelve) hours. (Patient not taking: Reported on 01/27/2022) 4 mL 0   nicotine polacrilex (NICORETTE) 4 MG gum Take 1 each (4 mg total) by mouth as needed for smoking cessation. (Patient not taking: Reported on 09/03/2022) 100 tablet 3   spironolactone (ALDACTONE) 25 MG tablet Take 0.5 tablets (12.5 mg total) by mouth daily. 30 tablet 6   torsemide (DEMADEX) 20 MG tablet Take 1 tablet (20 mg total) by mouth every other day. (Patient not taking: Reported on 06/26/2020) 90 tablet 3   traMADol HCl 100 MG TABS Take 100 mg by mouth every 8 (eight) hours as needed. (Patient not taking: Reported on 09/07/2021) 10 tablet 0   No current facility-administered medications for this encounter.    Allergies:   Patient has no known allergies.   Social History:  The patient  reports  that he has quit smoking. His smoking use included cigarettes. He has a 12.50 pack-year smoking history. He has never used smokeless tobacco. He reports that he does not currently use alcohol. He reports that he does not currently use drugs after having used the following drugs: Marijuana.   Family History:  The patient's family history includes Alcohol abuse in his cousin; Hypertension in his father.   ROS:  Please see the history of present illness.   All other systems are personally reviewed and negative.   Vital Signs: Doppler: 140 Automatic BP: 128/72 (99) HR: 76 SPO2: 100%   Weight: 213.6 lbs w/ eqt Last  wt: 210.6 lbs w/ equip     Exam: General:  NAD.  HEENT: normal  Neck: supple. JVP not elevated.  Carotids 2+ bilat; no bruits. No lymphadenopathy or thryomegaly appreciated. Cor: LVAD hum.  Lungs: Clear. Abdomen: soft, nontender, non-distended. No hepatosplenomegaly. No bruits or masses. Good bowel sounds. Driveline site clean. Anchor in place.  Extremities: no cyanosis, clubbing, rash. Warm no edema  Neuro: alert & oriented x 3. No focal deficits. Moves all 4 without problem     Recent Labs: 08/11/2022: ALT 27 09/03/2022: BUN 10; Creatinine, Ser 1.05; Hemoglobin 12.0; Platelets 166; Potassium 4.1; Sodium 138    Wt Readings from Last 3 Encounters:  09/03/22 96.9 kg (213 lb 9.6 oz)  08/11/22 94.4 kg (208 lb 3.2 oz)  07/02/22 95.5 kg (210 lb 9.6 oz)      ASSESSMENT AND PLAN:  1. RUQ ab pain - resolved. Suspect passed gallstone - Recommended lowfat diet - If recurrent symptoms may need evaluation for cholecystectomy  2. 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 Treated with milrinone and then weaned off.  Diuresed 40 pounds. VAD speed increased - He continues to do well from HF standpoint. NYHA I  - Volume  status ok  - Continue Entresto and spiro.  - We have  recently been discussing transplant. He is interested but needs to stop smoking and have 6 months of negative cotinine tests. We again encouraged him to work on smoking cessation. Discussed again today.   3. HM3 LVAD 09/06/2017.  - VAD interrogated personally. Parameters stable. - LDH 181 - INR 2.1  Discussed dosing with PharmD personally. - Off ASA with PUD.  - DL site looks good  4.  Essential HTN - Blood pressure slightly elevated today but overall well controlled. Continue current regimen.  5. H/o ETOH pancreatitis, 11/21 - remains quit from drinking (was stressed about his son) - see plan as above  6. Recurrent DL infection with subxiphoid abscess - s/p debridement and pec flap in 4/21 - remains on keflex.  - Resolved  7.  OSA - sleep study with very severe OSA (AHI 69/hr) - He has refused therapy multiple times but seems to have improved with weight loss and ETOH cessation. We have offered repeat testing to see if OSA has improved but he has declined.   8.  H/o GI bleed  - 08/2018 EGD showed gastritis and nonbleeding duodenal ulcers, colonoscopy with 5 polyps removed.  - Remains on PPI. No recent GI bleeding - hgb stable at 12.0  9. Tobacco Abuse  - continues to smoke a few cigs/day. No change - we discussed again that he needs to stop smoking to qualify for transplant.    Total time spent 35 minutes. Over half that time spent discussing above.   Signed, Arvilla Meres, MD  09/05/2022 1:11 PM  [facility] [address] Heart and Vascular [place] [address] [phone] (office) [phone] (fax)

## 2022-09-09 ENCOUNTER — Other Ambulatory Visit (HOSPITAL_COMMUNITY): Payer: Self-pay | Admitting: Unknown Physician Specialty

## 2022-09-09 DIAGNOSIS — Z95811 Presence of heart assist device: Secondary | ICD-10-CM

## 2022-09-09 DIAGNOSIS — Z7901 Long term (current) use of anticoagulants: Secondary | ICD-10-CM

## 2022-09-14 ENCOUNTER — Other Ambulatory Visit (HOSPITAL_COMMUNITY): Payer: 59

## 2022-09-15 ENCOUNTER — Ambulatory Visit (HOSPITAL_COMMUNITY)
Admission: RE | Admit: 2022-09-15 | Discharge: 2022-09-15 | Disposition: A | Discharge status: Home / Self Care | Payer: 59 | Source: Ambulatory Visit | Attending: Cardiology | Admitting: Cardiology

## 2022-09-15 ENCOUNTER — Ambulatory Visit (HOSPITAL_COMMUNITY): Payer: Self-pay | Admitting: Pharmacist

## 2022-09-15 DIAGNOSIS — Z95811 Presence of heart assist device: Secondary | ICD-10-CM | POA: Diagnosis present

## 2022-09-15 DIAGNOSIS — Z7901 Long term (current) use of anticoagulants: Secondary | ICD-10-CM | POA: Insufficient documentation

## 2022-09-15 LAB — PROTIME-INR
INR: 1.5 — ABNORMAL HIGH (ref 0.8–1.2)
Prothrombin Time: 18.3 seconds — ABNORMAL HIGH (ref 11.4–15.2)

## 2022-09-15 NOTE — Progress Notes (Signed)
Patient presents for dressing change and INR in VAD Clinic today.  Exit Site Care: Drive line is being maintained by VAD coordinators. Existing VAD dressing removed and site care performed using sterile technique. Drive line exit site cleaned with CHG swabs x 2 and rinsed with saline, allowed to dry, and Sorbaview dressing with silverlon patch applied. Anchor reapplied and covered with two large tegaderms. Drive line exit site incorporated, the velour is fully implanted at exit site. No erythema, tenderness, drainage, or foul odor noted.  Pt denies fever or chills. Continue weekly dressing changes. VAD coordinators to perform weekly dressing changes. Patient given 4 weekly kits today during visit and asked to bring one to clinic for each dressing change.  Plan: Return to clinic in 10 days for dressing change. Coumadin dosing per Lauren PharmD  Carlton Adam RN,BSN VAD Coordinator  Office: [phone]  24/7 Pager: [phone]

## 2022-09-17 ENCOUNTER — Other Ambulatory Visit (HOSPITAL_COMMUNITY): Payer: Self-pay | Admitting: Unknown Physician Specialty

## 2022-09-17 DIAGNOSIS — Z7901 Long term (current) use of anticoagulants: Secondary | ICD-10-CM

## 2022-09-17 DIAGNOSIS — Z95811 Presence of heart assist device: Secondary | ICD-10-CM

## 2022-09-24 ENCOUNTER — Ambulatory Visit (HOSPITAL_COMMUNITY)
Admission: RE | Admit: 2022-09-24 | Discharge: 2022-09-24 | Disposition: A | Discharge status: Home / Self Care | Payer: 59 | Source: Ambulatory Visit | Attending: Internal Medicine | Admitting: Internal Medicine

## 2022-09-24 ENCOUNTER — Ambulatory Visit (HOSPITAL_COMMUNITY): Payer: Self-pay | Admitting: Pharmacist

## 2022-09-24 DIAGNOSIS — Z95811 Presence of heart assist device: Secondary | ICD-10-CM | POA: Diagnosis present

## 2022-09-24 DIAGNOSIS — Z7901 Long term (current) use of anticoagulants: Secondary | ICD-10-CM

## 2022-09-24 LAB — PROTIME-INR
INR: 2.8 — ABNORMAL HIGH (ref 0.8–1.2)
Prothrombin Time: 29.9 seconds — ABNORMAL HIGH (ref 11.4–15.2)

## 2022-09-24 NOTE — Patient Instructions (Addendum)
Coumadin dosing per Leotis Shames PharmD Return to clinic in 10 days for dressing change Closest VAD center to [facility]:   [facility] [phone] 22.03 miles  [address], [place], [place], [address]

## 2022-09-24 NOTE — Progress Notes (Signed)
Patient presents for dressing change and INR in VAD Clinic today.  Pt states he is going to [facility] [place] [facility], [place] this weekend for a basketball tournament with his son. Provided with the closest VAD center information below.   Exit Site Care: Drive line is being maintained by VAD coordinators. Existing VAD dressing removed and site care performed using sterile technique. Drive line exit site cleaned with CHG swabs x 2 and rinsed with saline, allowed to dry, and Sorbaview dressing with silverlon patch applied. Anchor reapplied and covered with two large tegaderms. Drive line exit site incorporated, the velour is fully implanted at exit site. No erythema, tenderness, drainage, or foul odor noted.  Pt denies fever or chills. Continue weekly dressing changes. VAD coordinators to perform weekly dressing changes. Patient given 1 box of large tegaderm for home use.  Plan: Coumadin dosing per Lauren PharmD Return to clinic in 10 days for dressing change Closest VAD center to [facility]:   [facility] [phone]  [address], [place], [place], [address]  Alyce Pagan RN VAD Coordinator  Office: [phone]  24/7 Pager: [phone]

## 2022-09-27 ENCOUNTER — Other Ambulatory Visit (HOSPITAL_COMMUNITY): Payer: Self-pay | Admitting: *Deleted

## 2022-09-27 DIAGNOSIS — Z7901 Long term (current) use of anticoagulants: Secondary | ICD-10-CM

## 2022-09-27 DIAGNOSIS — I5042 Chronic combined systolic (congestive) and diastolic (congestive) heart failure: Secondary | ICD-10-CM

## 2022-09-27 DIAGNOSIS — T827XXA Infection and inflammatory reaction due to other cardiac and vascular devices, implants and grafts, initial encounter: Secondary | ICD-10-CM

## 2022-09-27 DIAGNOSIS — Z95811 Presence of heart assist device: Secondary | ICD-10-CM

## 2022-09-27 DIAGNOSIS — I50812 Chronic right heart failure: Secondary | ICD-10-CM

## 2022-09-27 MED ORDER — CEPHALEXIN 500 MG PO CAPS
500.0000 mg | ORAL_CAPSULE | Freq: Three times a day (TID) | ORAL | 11 refills | Status: DC
Start: 2022-09-27 — End: 2023-10-11

## 2022-09-30 ENCOUNTER — Other Ambulatory Visit (HOSPITAL_COMMUNITY): Payer: Self-pay | Admitting: Unknown Physician Specialty

## 2022-09-30 DIAGNOSIS — Z95811 Presence of heart assist device: Secondary | ICD-10-CM

## 2022-09-30 DIAGNOSIS — Z7901 Long term (current) use of anticoagulants: Secondary | ICD-10-CM

## 2022-10-04 ENCOUNTER — Other Ambulatory Visit (HOSPITAL_COMMUNITY): Payer: 59

## 2022-10-05 ENCOUNTER — Other Ambulatory Visit (HOSPITAL_COMMUNITY): Payer: Self-pay

## 2022-10-05 ENCOUNTER — Telehealth (HOSPITAL_COMMUNITY): Payer: Self-pay

## 2022-10-05 ENCOUNTER — Ambulatory Visit (HOSPITAL_COMMUNITY): Payer: Self-pay | Admitting: Pharmacist

## 2022-10-05 ENCOUNTER — Other Ambulatory Visit (HOSPITAL_COMMUNITY): Payer: Self-pay | Admitting: Unknown Physician Specialty

## 2022-10-05 ENCOUNTER — Ambulatory Visit (HOSPITAL_COMMUNITY)
Admission: RE | Admit: 2022-10-05 | Discharge: 2022-10-05 | Disposition: A | Discharge status: Home / Self Care | Payer: 59 | Source: Ambulatory Visit | Attending: Cardiology | Admitting: Cardiology

## 2022-10-05 DIAGNOSIS — Z4801 Encounter for change or removal of surgical wound dressing: Secondary | ICD-10-CM | POA: Insufficient documentation

## 2022-10-05 DIAGNOSIS — Z951 Presence of aortocoronary bypass graft: Secondary | ICD-10-CM | POA: Diagnosis present

## 2022-10-05 DIAGNOSIS — Z7901 Long term (current) use of anticoagulants: Secondary | ICD-10-CM

## 2022-10-05 DIAGNOSIS — Z95811 Presence of heart assist device: Secondary | ICD-10-CM | POA: Diagnosis not present

## 2022-10-05 LAB — PROTIME-INR
INR: 2.5 — ABNORMAL HIGH (ref 0.8–1.2)
Prothrombin Time: 27.1 seconds — ABNORMAL HIGH (ref 11.4–15.2)

## 2022-10-05 NOTE — Telephone Encounter (Signed)
error 

## 2022-10-05 NOTE — Progress Notes (Signed)
Patient presents for dressing change and INR in VAD Clinic today.  Exit Site Care: Drive line is being maintained by VAD coordinators. Existing VAD dressing removed and site care performed using sterile technique. Drive line exit site cleaned with CHG swabs x 2 and rinsed with saline, allowed to dry, and Sorbaview dressing with silverlon patch applied. Anchor reapplied and covered with two large tegaderms. Drive line exit site incorporated, the velour is fully implanted at exit site. No erythema, tenderness, drainage, or foul odor noted.  Pt denies fever or chills. Continue weekly dressing changes. VAD coordinators to perform weekly dressing changes. Patient given 2 boxes of large tegaderm for home use.  Plan: Coumadin dosing per Lauren PharmD Return to clinic in 10 days for dressing change   Carlton Adam RN VAD Coordinator  Office: [phone]  24/7 Pager: [phone]

## 2022-10-06 ENCOUNTER — Other Ambulatory Visit (HOSPITAL_COMMUNITY): Payer: 59

## 2022-10-10 ENCOUNTER — Telehealth: Payer: Self-pay | Admitting: *Deleted

## 2022-10-10 NOTE — Telephone Encounter (Signed)
Received call from patient stating that his dressing was falling off due to sweating profusely while working outside. Decision was made to bring pt to clinic for dressing change to prevent infection. Met pt in VAD clinic.   Exit site care: Existing VAD dressing removed and site care performed using sterile technique. Drive line exit site cleaned with Chlora prep applicators x 2, allowed to dry, and Sorbaview dressing with Silverlon patch applied. Exit site healed and incorporated, the velour is fully implanted at exit site. No redness, tenderness, drainage, foul odor or rash noted. Drive line anchor re-applied. 2 large tegaderms placed over dressing. Pt denies fever or chills.    Plan:  Return in 10 days for dressing change in VAD clinic.   Alyce Pagan RN VAD Coordinator  Office: [phone]  24/7 Pager: [phone]

## 2022-10-15 ENCOUNTER — Other Ambulatory Visit (HOSPITAL_COMMUNITY): Payer: 59

## 2022-10-20 ENCOUNTER — Other Ambulatory Visit (HOSPITAL_COMMUNITY): Payer: Self-pay

## 2022-10-20 ENCOUNTER — Other Ambulatory Visit (HOSPITAL_COMMUNITY): Payer: 59

## 2022-10-20 DIAGNOSIS — Z95811 Presence of heart assist device: Secondary | ICD-10-CM

## 2022-10-20 DIAGNOSIS — Z7901 Long term (current) use of anticoagulants: Secondary | ICD-10-CM

## 2022-10-21 ENCOUNTER — Other Ambulatory Visit (HOSPITAL_COMMUNITY): Payer: 59

## 2022-10-25 ENCOUNTER — Ambulatory Visit (HOSPITAL_COMMUNITY): Payer: Self-pay | Admitting: Pharmacist

## 2022-10-25 ENCOUNTER — Other Ambulatory Visit (HOSPITAL_COMMUNITY): Payer: Self-pay | Admitting: *Deleted

## 2022-10-25 ENCOUNTER — Ambulatory Visit (HOSPITAL_COMMUNITY)
Admission: RE | Admit: 2022-10-25 | Discharge: 2022-10-25 | Disposition: A | Discharge status: Home / Self Care | Payer: 59 | Source: Ambulatory Visit | Attending: Internal Medicine | Admitting: Internal Medicine

## 2022-10-25 ENCOUNTER — Encounter (HOSPITAL_COMMUNITY): Payer: Self-pay | Admitting: Internal Medicine

## 2022-10-25 VITALS — BP 101/62 | HR 68 | Ht 74.0 in | Wt 212.4 lb

## 2022-10-25 DIAGNOSIS — Z720 Tobacco use: Secondary | ICD-10-CM | POA: Diagnosis not present

## 2022-10-25 DIAGNOSIS — Z4509 Encounter for adjustment and management of other cardiac device: Secondary | ICD-10-CM | POA: Diagnosis not present

## 2022-10-25 DIAGNOSIS — Z86010 Personal history of colonic polyps: Secondary | ICD-10-CM | POA: Diagnosis not present

## 2022-10-25 DIAGNOSIS — Z7901 Long term (current) use of anticoagulants: Secondary | ICD-10-CM | POA: Diagnosis not present

## 2022-10-25 DIAGNOSIS — Z8619 Personal history of other infectious and parasitic diseases: Secondary | ICD-10-CM | POA: Insufficient documentation

## 2022-10-25 DIAGNOSIS — F1011 Alcohol abuse, in remission: Secondary | ICD-10-CM | POA: Diagnosis not present

## 2022-10-25 DIAGNOSIS — Z8719 Personal history of other diseases of the digestive system: Secondary | ICD-10-CM | POA: Diagnosis not present

## 2022-10-25 DIAGNOSIS — I110 Hypertensive heart disease with heart failure: Secondary | ICD-10-CM | POA: Insufficient documentation

## 2022-10-25 DIAGNOSIS — Z792 Long term (current) use of antibiotics: Secondary | ICD-10-CM | POA: Insufficient documentation

## 2022-10-25 DIAGNOSIS — Z95811 Presence of heart assist device: Secondary | ICD-10-CM

## 2022-10-25 DIAGNOSIS — T827XXD Infection and inflammatory reaction due to other cardiac and vascular devices, implants and grafts, subsequent encounter: Secondary | ICD-10-CM | POA: Insufficient documentation

## 2022-10-25 DIAGNOSIS — I428 Other cardiomyopathies: Secondary | ICD-10-CM | POA: Diagnosis not present

## 2022-10-25 DIAGNOSIS — I5022 Chronic systolic (congestive) heart failure: Secondary | ICD-10-CM | POA: Diagnosis present

## 2022-10-25 DIAGNOSIS — Z79899 Other long term (current) drug therapy: Secondary | ICD-10-CM | POA: Insufficient documentation

## 2022-10-25 DIAGNOSIS — Z87891 Personal history of nicotine dependence: Secondary | ICD-10-CM | POA: Insufficient documentation

## 2022-10-25 DIAGNOSIS — K279 Peptic ulcer, site unspecified, unspecified as acute or chronic, without hemorrhage or perforation: Secondary | ICD-10-CM | POA: Diagnosis not present

## 2022-10-25 DIAGNOSIS — G4733 Obstructive sleep apnea (adult) (pediatric): Secondary | ICD-10-CM | POA: Diagnosis not present

## 2022-10-25 LAB — CBC
HCT: 38 % — ABNORMAL LOW (ref 39.0–52.0)
Hemoglobin: 12.2 g/dL — ABNORMAL LOW (ref 13.0–17.0)
MCH: 28.7 pg (ref 26.0–34.0)
MCHC: 32.1 g/dL (ref 30.0–36.0)
MCV: 89.4 fL (ref 80.0–100.0)
Platelets: 196 10*3/uL (ref 150–400)
RBC: 4.25 MIL/uL (ref 4.22–5.81)
RDW: 13.5 % (ref 11.5–15.5)
WBC: 5 10*3/uL (ref 4.0–10.5)
nRBC: 0 % (ref 0.0–0.2)

## 2022-10-25 LAB — BASIC METABOLIC PANEL
Anion gap: 9 (ref 5–15)
BUN: 11 mg/dL (ref 6–20)
CO2: 21 mmol/L — ABNORMAL LOW (ref 22–32)
Calcium: 9.3 mg/dL (ref 8.9–10.3)
Chloride: 106 mmol/L (ref 98–111)
Creatinine, Ser: 0.98 mg/dL (ref 0.61–1.24)
GFR, Estimated: >60 mL/min (ref >60)
Glucose, Bld: 102 mg/dL — ABNORMAL HIGH (ref 70–99)
Potassium: 4.2 mmol/L (ref 3.5–5.1)
Sodium: 136 mmol/L (ref 135–145)

## 2022-10-25 LAB — PROTIME-INR
INR: 1.5 — ABNORMAL HIGH (ref 0.8–1.2)
Prothrombin Time: 18.1 seconds — ABNORMAL HIGH (ref 11.4–15.2)

## 2022-10-25 LAB — LACTATE DEHYDROGENASE: LDH: 170 U/L (ref 98–192)

## 2022-10-25 NOTE — Progress Notes (Signed)
Patient presents for 2 month f/u with Annual Maintenance in VAD clinic today alone. Denies any issues with VAD or equipment.   Pt states he is feeling great. Denies lightheadedness, dizziness, falls, heart failure symptoms, and signs of bleeding.   He says he has not had a cigarette for 14 days. He is not wearing patch (irritates skin), but credits family and friends for their support. He wants to wait before starting nicotine screening. He is looking forward to 6 months free of nicotine and then possibly get listed for heart transplant.   Pt brought home equipment and reports "short" in his MPU power cord with multiple "no external power" alarms noted on history. [employer] checked and replaced MPU power cord.   Low PI noted on VAD parameters; patient admits he hasn't had anything by mouth this morning. Provided water to patient. He does report he has increased daily intake to 2 liters/day.   Vital Signs: Doppler: 100 Automatic BP: 101/62 (88) HR: 68 sinus with PVCs SPO2: unable to obtain   Weight: 212.4 lbs w/ eqt Last wt: 213.6 lbs w/ equip   VAD Indication: Destination Therapy    VAD interrogation & Equipment Management: Speed: 6500 Flow: 5.4 Power: 5.6w    PI: 1.6 Hct: 39  Alarms: mult "no external power" alarms - see above Events: >50 daily  Primary Controller:  Replace back up battery in 27 months Back up controller: Replace back up battery in 13 months  Annual Equipment Maintenance on UBC/PM was performed 10/25/22   I reviewed the LVAD parameters from today and compared the results to the patient's prior recorded data.  LVAD interrogation was NEGATIVE for significant power changes, NEGATIVE for clinical alarms and STABLE for PI events/speed drops. No programming changes were made and pump is functioning within specified parameters.   Exit Site Care: Drive line is being maintained weekly by VAD coordinators. Existing VAD dressing removed and site care performed using sterile  technique. Drive line exit site cleaned with Chlora prep applicators x 2, RINSED WITH SALINE allowed to dry, and Sorbaview dressing with Silverlon patch reapplied. Anchor reapplied. Covered with 2 large tegaderms. Drive line exit site incorporated, the velour is fully implanted at exit site. No drainage, erythema, tenderness, or foul odor noted.  Pt denies fever or chills. Continue weekly dressing changes. VAD coordinators to perform weekly dressing changes. Pt provided with 4 weekly kits, 4 anchors, and 4 large tegaderms for home use if needed in between VAD coordinator dressing changes in clinic.    Device: none  BP & Labs:  Doppler 100 - reflecting modified systolic   Hgb 12.2 - No S/S of bleeding. Specifically denies melena/BRBPR or nosebleeds.    LDH pending - established baseline of 200-300. Denies tea-colored urine. No power elevations noted on interrogation.   Batteries Manufacture Date: Number of uses: Re-calibration  07/02/20 160 - 190 Demonstrated how to perform battery calibration to patient with verbalized understanding   Annual maintenance completed per [employer] on patient's mobile power unit (power cord replaced). 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    Backup system controller [phone]) - 11 volt battery charged during visit. Patient missing spare battery clips. Provided him with back up set. Pt's batteries in black bag down to one - two lights charge on each. Instructed him to keep fully charged batteries along with extra set of battery clips in his back up bag. He will replace with fully charged batteries when he gets home.  He is currently wearing two fully charged batteries on his primary controller [phone]).      Patient Instructions:  No change in medications. Return in one week for dressing change. Labs today - will call you if abnormal. We will schedule and call you with your 2 month follow up appointment with  Dr. [name] in VAD Clinic. Please call us if any questions or concerns at [phone].    Hessie Diener RN VAD Coordinator  Office: [phone]  24/7 Pager: [phone]

## 2022-10-25 NOTE — Patient Instructions (Signed)
No change in medications. Return in one week for dressing change. Labs today - will call you if abnormal. We will schedule and call you with your 2 month follow up appointment with Dr. [name] in VAD Clinic. Please call us if any questions or concerns at [phone].

## 2022-10-29 NOTE — Progress Notes (Signed)
VAD Clinic Note    ID:  Jonathan Wood, Jonathan Wood 06-26-1970, MRN [id number]  Location: Home  Provider location: [place] [facility] Type of Visit: Established patient  PCP: [name]  Cardiologist:  Arvilla Meres, MD Primary HF: Dr [name]   Chief Complaint: Heart Failure/LVAD  History of Present Illness:  Jonathan Wood is a 52 y.o. 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  Admitted 4/20 with volume overload and anemia. Diuresed with IV lasix and transitioned to torsemide 20 mg daily. GI consulted EGD with gastritis and nonbleeding duodenal ulcers, colonoscopy with 5 polyps removed.  Admitted 12/20 for DL infection with overlying cellulitis and volume overload.   Admitted 4/21 for DL infection with large subxiphoid abscess. Underwent several debridements and eventually a pec muscle flap. Wound cx + MSSA. Initially on ancef and rifabutin but expanded to cefipime then changed to IV ancef and po rifabutin. Now on oral keflex.  Readmitted 6/21 with superficial abscess and tachypnea. Superficial abscess I&D'd and packed. CT scan done and showed possible deeper fluid collection. Reviewed with TCTS who felt it was muscle flap. Sent to IR for possible drainage but u/s confirmed it was the muscle flap and no drainable fluid collection. Wound cx negative. ID saw and recommended continuing Keflex.   Admitted 8/21 with lactic acidosis due to cardiogenic shock. Started on 0.25 of milrinone and NE. Ramp echo on 8/13 with severe RV dysfunction. Speed increased 5600-> 6300. Tolerated ok. Repeat ramp echo on 8/20 speed increased to 6500. Diuresed over 40 pounds.   Admitted 10/21 with  ETOH pancreatitis. Gallbladder ok. Resolved with medical management     Here for routine follow up. Says he is feeling great. Active no SOB or CP. No edema. He says he has not had a cigarette  for 14 days. Brought in home equipment and reports "short" in his MPU power cord with multiple "no external power" alarms noted on history. [employer] checked and replaced MPU power cord. Denies orthopnea or PND. No fevers, chills or problems with driveline. No bleeding, melena or neuro symptoms. No VAD alarms. Taking all meds as prescribed.    VAD Indication: Destination Therapy    VAD interrogation & Equipment Management: Speed: 6500 Flow: 5.4 Power: 5.6w    PI: 1.6 Hct: 39   Alarms: mult "no external power" alarms - see above Events: >50 daily   Primary Controller:  Replace back up battery in 27 months Back up controller: Replace back up battery in 13 months   Annual Equipment Maintenance on UBC/PM was performed 10/25/22     Past Medical History:  Diagnosis Date   Asthma    CHF (congestive heart failure) (HCC)    a. 09/2016: EF 20-25% with cath showing normal cors   GERD (gastroesophageal reflux disease)    History of hiatal hernia    LVAD (left ventricular assist device) present (HCC)    OSA on CPAP 09/06/2018   Severe OSA with AHI 68/hr on CPAP at 12cm H2O   Past Surgical History:  Procedure Laterality Date   APPLICATION OF A-CELL OF CHEST/ABDOMEN N/A 08/24/2019   Procedure: Application Of A-Cell Of Chest/Abdomen;  Surgeon: Kerin Perna, MD;  Location: [facility] OR;  Service: Thoracic;  Laterality: N/A;   APPLICATION OF A-CELL OF CHEST/ABDOMEN N/A 09/05/2019   Procedure: Application Of A-Cell Of Chest/Abdomen;  Surgeon: Peggye Form, DO;  Location: [facility] OR;  Service: Plastics;  Laterality: N/A;   APPLICATION OF A-CELL OF CHEST/ABDOMEN  09/03/2019   Procedure: Application Of A-Cell Of Chest/Abdomen;  Surgeon: Kerin Perna, MD;  Location: [facility] OR;  Service: Thoracic;;   APPLICATION OF WOUND VAC N/A 08/22/2019   Procedure: Debridement, Irrigation and Packing of Abdominal Incision.;  Surgeon: Kerin Perna, MD;  Location: [facility] OR;  Service: Thoracic;  Laterality: N/A;    APPLICATION OF WOUND VAC N/A 08/24/2019   Procedure: Irrigation and Debridement with WOUND VAC APPLICATION;  Surgeon: Kerin Perna, MD;  Location: [facility] OR;  Service: Thoracic;  Laterality: N/A;   APPLICATION OF WOUND VAC N/A 08/30/2019   Procedure: APPLICATION OF WOUND VAC;  Surgeon: Kerin Perna, MD;  Location: [facility] OR;  Service: Thoracic;  Laterality: N/A;   APPLICATION OF WOUND VAC N/A 09/03/2019   Procedure: WOUND VAC CHANGE;  Surgeon: Kerin Perna, MD;  Location: [facility] OR;  Service: Thoracic;  Laterality: N/A;   BIOPSY  08/23/2018   Procedure: BIOPSY;  Surgeon: Lemar Lofty., MD;  Location: [facility] ENDOSCOPY;  Service: Gastroenterology;;   COLONOSCOPY N/A 08/23/2018   Procedure: COLONOSCOPY;  Surgeon: Lemar Lofty., MD;  Location: [facility] ENDOSCOPY;  Service: Gastroenterology;  Laterality: N/A;   ENTEROSCOPY N/A 08/23/2018   Procedure: ENTEROSCOPY;  Surgeon: Meridee Score Netty Starring., MD;  Location: [facility] ENDOSCOPY;  Service: Gastroenterology;  Laterality: N/A;   HEMOSTASIS CLIP PLACEMENT  08/23/2018   Procedure: HEMOSTASIS CLIP PLACEMENT;  Surgeon: Lemar Lofty., MD;  Location: [facility] ENDOSCOPY;  Service: Gastroenterology;;   IABP INSERTION N/A 09/05/2017   Procedure: IABP INSERTION;  Surgeon: Dolores Patty, MD;  Location: [facility] INVASIVE CV LAB;  Service: Cardiovascular;  Laterality: N/A;   INSERTION OF IMPLANTABLE LEFT VENTRICULAR ASSIST DEVICE N/A 09/06/2017   Procedure: INSERTION OF IMPLANTABLE LEFT VENTRICULAR ASSIST DEVICE - HM3;  Surgeon: Kerin Perna, MD;  Location: [facility] OR;  Service: Open Heart Surgery;  Laterality: N/A;  HM3   MUSCLE FLAP CLOSURE N/A 09/05/2019   Procedure: MUSCLE FLAP CLOSURE;  Surgeon: Peggye Form, DO;  Location: [facility] OR;  Service: Plastics;  Laterality: N/A;   NASAL FRACTURE SURGERY  1987   POLYPECTOMY  08/23/2018   Procedure: POLYPECTOMY;  Surgeon: Mansouraty, Netty Starring., MD;  Location: [facility] ENDOSCOPY;  Service: Gastroenterology;;   RIGHT HEART  CATH N/A 08/30/2017   Procedure: RIGHT HEART CATH;  Surgeon: Dolores Patty, MD;  Location: [facility] INVASIVE CV LAB;  Service: Cardiovascular;  Laterality: N/A;   RIGHT/LEFT HEART CATH AND CORONARY ANGIOGRAPHY N/A 10/08/2016   Procedure: Right/Left Heart Cath and Coronary Angiography;  Surgeon: Orpah Cobb, MD;  Location: [facility] INVASIVE CV LAB;  Service: Cardiovascular;  Laterality: N/A;   STERNAL WOUND DEBRIDEMENT N/A 08/20/2019   Procedure: DEBRIDEMENT OF LVAD DRIVELINE TUNNEL;  Surgeon: Kerin Perna, MD;  Location: [facility] OR;  Service: Thoracic;  Laterality: N/A;   STERNAL WOUND DEBRIDEMENT N/A 09/05/2019   Procedure: DEBRIDEMENT AND CLOSURE OF ABDOMINAL WOUND;  Surgeon: Peggye Form, DO;  Location: [facility] OR;  Service: Plastics;  Laterality: N/A;   SUBMUCOSAL TATTOO INJECTION  08/23/2018   Procedure: SUBMUCOSAL TATTOO INJECTION;  Surgeon: Meridee Score Netty Starring., MD;  Location: [facility] ENDOSCOPY;  Service: Gastroenterology;;   TEE WITHOUT CARDIOVERSION N/A 09/06/2017   Procedure: TRANSESOPHAGEAL ECHOCARDIOGRAM (TEE);  Surgeon: Donata Clay, Theron Arista, MD;  Location: [facility] OR;  Service: Open Heart Surgery;  Laterality: N/A;   WOUND DEBRIDEMENT N/A 08/30/2019   Procedure: Debridement Abdominal Wound;  Surgeon: Zenaida Niece  Greg Cutter, MD;  Location: [facility] OR;  Service: Thoracic;  Laterality: N/A;     Current Outpatient Medications  Medication Sig Dispense Refill   acetaminophen (TYLENOL) 500 MG tablet Take 1,500 mg by mouth 3 (three) times daily as needed for headache (pain).     cephALEXin (KEFLEX) 500 MG capsule Take 1 capsule (500 mg total) by mouth 3 (three) times daily. 90 capsule 11   doxazosin (CARDURA) 2 MG tablet TAKE 1/2 TABLET BY MOUTH AT BEDTIME 45 tablet 1   ENTRESTO 97-103 MG TAKE 1 TABLET BY MOUTH TWICE DAILY 60 tablet 6   gabapentin (NEURONTIN) 300 MG capsule TAKE ONE CAPSULE BY MOUTH THREE TIMES DAILY AT 9am, 3pm, AND 9pm 90 capsule 5   pantoprazole (PROTONIX) 40 MG tablet Take 1 tablet (40 mg total) by  mouth 2 (two) times daily. 180 tablet 3   sildenafil (REVATIO) 20 MG tablet Take 1 tablet (20 mg total) by mouth 3 (three) times daily. 90 tablet 11   sildenafil (VIAGRA) 100 MG tablet Take 1 tablet (100 mg total) by mouth daily as needed for erectile dysfunction. 10 tablet 5   spironolactone (ALDACTONE) 25 MG tablet Take 0.5 tablets (12.5 mg total) by mouth daily. 30 tablet 6   warfarin (COUMADIN) 5 MG tablet Take 1.5 tablets (7.5 mg total) by mouth daily at 4 PM.  7.5 mg (1.5 tab) all days OR as directed by HF clinic 90 tablet 5   albuterol (PROAIR HFA) 108 (90 Base) MCG/ACT inhaler Inhale 2 puffs into the lungs every 6 (six) hours as needed for wheezing or shortness of breath. (Patient not taking: Reported on 11/11/2021) 1 each 6   enoxaparin (LOVENOX) 40 MG/0.4ML injection Inject 0.4 mLs (40 mg total) into the skin every 12 (twelve) hours. (Patient not taking: Reported on 01/27/2022) 4 mL 0   nicotine (NICODERM CQ - DOSED IN MG/24 HOURS) 21 mg/24hr patch Place 1 patch (21 mg total) onto the skin daily. (Patient not taking: Reported on 10/25/2022) 28 patch 3   nicotine polacrilex (NICORETTE) 4 MG gum Take 1 each (4 mg total) by mouth as needed for smoking cessation. (Patient not taking: Reported on 09/03/2022) 100 tablet 3   torsemide (DEMADEX) 20 MG tablet Take 1 tablet (20 mg total) by mouth every other day. (Patient not taking: Reported on 06/26/2020) 90 tablet 3   traMADol HCl 100 MG TABS Take 100 mg by mouth every 8 (eight) hours as needed. (Patient not taking: Reported on 09/07/2021) 10 tablet 0   No current facility-administered medications for this encounter.    Allergies:   Patient has no known allergies.   Social History:  The patient  reports that he has quit smoking. His smoking use included cigarettes. He has a 12.50 pack-year smoking history. He has never used smokeless tobacco. He reports that he does not currently use alcohol. He reports that he does not currently use drugs after having  used the following drugs: Marijuana.   Family History:  The patient's family history includes Alcohol abuse in his cousin; Hypertension in his father.   ROS:  Please see the history of present illness.   All other systems are personally reviewed and negative.   Vital Signs: Doppler: 140 Automatic BP: 128/72 (99) HR: 76 SPO2: 100%   Weight: 213.6 lbs w/ eqt Last wt: 210.6 lbs w/ equip     Exam: General:  NAD.  HEENT: normal  Neck: supple. JVP not elevated.  Carotids 2+ bilat; no bruits. No lymphadenopathy  or thryomegaly appreciated. Cor: LVAD hum.  Lungs: Clear. Abdomen: soft, nontender, non-distended. No hepatosplenomegaly. No bruits or masses. Good bowel sounds. Driveline site clean. Anchor in place.  Extremities: no cyanosis, clubbing, rash. Warm no edema  Neuro: alert & oriented x 3. No focal deficits. Moves all 4 without problem    Recent Labs: 08/11/2022: ALT 27 10/25/2022: BUN 11; Creatinine, Ser 0.98; Hemoglobin 12.2; Platelets 196; Potassium 4.2; Sodium 136    Wt Readings from Last 3 Encounters:  10/25/22 96.3 kg (212 lb 6.4 oz)  09/03/22 96.9 kg (213 lb 9.6 oz)  08/11/22 94.4 kg (208 lb 3.2 oz)      ASSESSMENT AND PLAN:  1. 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 Treated with milrinone and then weaned off.  Diuresed 40 pounds. VAD speed increased - He continues to do well from HF standpoint. NYHA I - Volume status ok - Continue Entresto and spiro.  - We have recently been discussing transplant. He continues to be very motivated. Now has quit smoking x 2 weeks. Will start checking monthly cotinine levels   3. HM3 LVAD 09/06/2017.  - VAD interrogated personally. Parameters stable. Having some PIs. Reminded to hydrate - LDH 170 - INR 1.5  Discussed dosing with PharmD personally. - Off ASA with PUD.  - DL site looks good  4.  Essential HTN - B.bp  5. H/o  ETOH pancreatitis, 11/21 - remains quit from drinking (was stressed about his son) - see plan as above  6. Recurrent DL infection with subxiphoid abscess - s/p debridement and pec flap in 4/21 - remains on keflex.  - resolved  7.  OSA - sleep study with very severe OSA (AHI 69/hr) - He has refused therapy multiple times but seems to have improved with weight loss and ETOH cessation.   8.  H/o GI bleed  - 08/2018 EGD showed gastritis and nonbleeding duodenal ulcers, colonoscopy with 5 polyps removed.  - Remains on PPI. No recent GI bleeding - hgb stable at 12.0 -> 12.2  9. Tobacco Abuse  - has now quit for 14 days. Congratulated him - will start checking monthly cotinine levels at next visit.    Total time spent 40 minutes. Over half that time spent discussing above.   Signed, Arvilla Meres, MD  10/29/2022 2:17 PM  [facility] [address] Heart and Vascular [place] [address] [phone] (office) [phone] (fax)

## 2022-11-01 ENCOUNTER — Other Ambulatory Visit (HOSPITAL_COMMUNITY): Payer: 59

## 2022-11-01 ENCOUNTER — Other Ambulatory Visit (HOSPITAL_COMMUNITY): Payer: Self-pay | Admitting: *Deleted

## 2022-11-01 DIAGNOSIS — Z95811 Presence of heart assist device: Secondary | ICD-10-CM

## 2022-11-02 ENCOUNTER — Ambulatory Visit (HOSPITAL_COMMUNITY): Payer: Self-pay | Admitting: Pharmacist

## 2022-11-02 ENCOUNTER — Ambulatory Visit (HOSPITAL_COMMUNITY)
Admission: RE | Admit: 2022-11-02 | Discharge: 2022-11-02 | Disposition: A | Discharge status: Home / Self Care | Payer: 59 | Source: Ambulatory Visit | Attending: Internal Medicine | Admitting: Internal Medicine

## 2022-11-02 DIAGNOSIS — Z7901 Long term (current) use of anticoagulants: Secondary | ICD-10-CM | POA: Insufficient documentation

## 2022-11-02 DIAGNOSIS — Z95811 Presence of heart assist device: Secondary | ICD-10-CM | POA: Insufficient documentation

## 2022-11-02 DIAGNOSIS — Z4801 Encounter for change or removal of surgical wound dressing: Secondary | ICD-10-CM | POA: Diagnosis not present

## 2022-11-02 LAB — CBC
HCT: 33.3 % — ABNORMAL LOW (ref 39.0–52.0)
Hemoglobin: 10.6 g/dL — ABNORMAL LOW (ref 13.0–17.0)
MCH: 27.8 pg (ref 26.0–34.0)
MCHC: 31.8 g/dL (ref 30.0–36.0)
MCV: 87.4 fL (ref 80.0–100.0)
Platelets: 218 10*3/uL (ref 150–400)
RBC: 3.81 MIL/uL — ABNORMAL LOW (ref 4.22–5.81)
RDW: 13.4 % (ref 11.5–15.5)
WBC: 4.8 10*3/uL (ref 4.0–10.5)
nRBC: 0 % (ref 0.0–0.2)

## 2022-11-02 LAB — PROTIME-INR
INR: 1.8 — ABNORMAL HIGH (ref 0.8–1.2)
Prothrombin Time: 20.7 seconds — ABNORMAL HIGH (ref 11.4–15.2)

## 2022-11-02 NOTE — Progress Notes (Signed)
CSW met with patient in the clinic. Patient in good spirits and shared his upcoming trip for son's basketball tournament. CSW provided patient with a new VAD vest as his current one was old and needed replacement. Patient grateful for the assistance and vest. CSW available as needed. Lasandra Beech, LCSW, CCSW-MCS [phone]

## 2022-11-02 NOTE — Addendum Note (Signed)
Encounter addended by: Marcy Siren, LCSW on: 11/02/2022 3:53 PM  Actions taken: Clinical Note Signed

## 2022-11-02 NOTE — Progress Notes (Signed)
Pt presents to VAD Clinic for dressing change and INR. States he noticed some blood in his stool for the last week but it has resolved in the last day. Dr. [name] made aware CBC checked today. Pt asymptomatic denies lightheadedness, dizziness, falls, shortness of breath, or generalized fatigue.   Exit site care: Existing VAD dressing removed and site care performed using sterile technique. Drive line exit site cleaned with Chlora prep applicators x 2, allowed to dry, and Sorbaview dressing with Silverlon patch applied. Exit site healed and incorporated, the velour is fully implanted at exit site. No redness, tenderness, drainage, foul odor or rash noted. Drive line anchor re-applied. 2 large tegaderms placed over dressing. Pt denies fever or chills.    Plan:  Return in 10 days for dressing change in VAD clinic. Notify VAD Coordinator if reoccurrence of blood in stool occurs Pt traveling to [place] for basketball tournament this weekend. Given the closest VAD Hospital information below:   [facility] [place] Phone: [phone] [address] [place], [place], [address] [place]  [name] RN,BSN VAD Coordinator  Office: [phone]  24/7 Pager: [phone]

## 2022-11-03 ENCOUNTER — Other Ambulatory Visit (HOSPITAL_COMMUNITY): Payer: Self-pay

## 2022-11-03 DIAGNOSIS — Z7901 Long term (current) use of anticoagulants: Secondary | ICD-10-CM

## 2022-11-03 DIAGNOSIS — Z95811 Presence of heart assist device: Secondary | ICD-10-CM

## 2022-11-05 ENCOUNTER — Other Ambulatory Visit (HOSPITAL_COMMUNITY): Payer: 59

## 2022-11-10 ENCOUNTER — Ambulatory Visit (HOSPITAL_COMMUNITY): Payer: Self-pay | Admitting: Pharmacist

## 2022-11-10 ENCOUNTER — Ambulatory Visit (HOSPITAL_COMMUNITY)
Admission: RE | Admit: 2022-11-10 | Discharge: 2022-11-10 | Disposition: A | Discharge status: Home / Self Care | Payer: 59 | Source: Ambulatory Visit | Attending: Internal Medicine | Admitting: Internal Medicine

## 2022-11-10 DIAGNOSIS — Z95811 Presence of heart assist device: Secondary | ICD-10-CM

## 2022-11-10 DIAGNOSIS — I5042 Chronic combined systolic (congestive) and diastolic (congestive) heart failure: Secondary | ICD-10-CM

## 2022-11-10 DIAGNOSIS — Z7901 Long term (current) use of anticoagulants: Secondary | ICD-10-CM | POA: Diagnosis not present

## 2022-11-10 DIAGNOSIS — I50812 Chronic right heart failure: Secondary | ICD-10-CM

## 2022-11-10 DIAGNOSIS — I10 Essential (primary) hypertension: Secondary | ICD-10-CM

## 2022-11-10 DIAGNOSIS — T827XXA Infection and inflammatory reaction due to other cardiac and vascular devices, implants and grafts, initial encounter: Secondary | ICD-10-CM

## 2022-11-10 LAB — PROTIME-INR
INR: 1.8 — ABNORMAL HIGH (ref 0.8–1.2)
Prothrombin Time: 21.1 seconds — ABNORMAL HIGH (ref 11.4–15.2)

## 2022-11-10 MED ORDER — SILDENAFIL CITRATE 20 MG PO TABS
20.0000 mg | ORAL_TABLET | Freq: Three times a day (TID) | ORAL | 11 refills | Status: DC
Start: 2022-11-10 — End: 2023-02-10

## 2022-11-10 MED ORDER — SPIRONOLACTONE 25 MG PO TABS
12.5000 mg | ORAL_TABLET | Freq: Every day | ORAL | 6 refills | Status: DC
Start: 2022-11-10 — End: 2023-04-27

## 2022-11-10 NOTE — Progress Notes (Signed)
Pt presents to VAD Clinic for dressing change and INR. Denies further blood in his stool.   Pt left shower bag in [place] on recent trip. Provided with new shower bag today.   Exit site care: Existing VAD dressing removed and site care performed using sterile technique. Drive line exit site cleaned with Chlora prep applicators x 2, allowed to dry, and Sorbaview dressing with Silverlon patch applied. Exit site healed and incorporated, the velour is fully implanted at exit site. No redness, tenderness, drainage, foul odor or rash noted. Drive line anchor re-applied. 2 large tegaderms placed over dressing. Pt denies fever or chills.    Plan:  Return in 10 days for dressing change in VAD clinic. Notify VAD Coordinator if reoccurrence of blood in stool occurs  Alyce Pagan RN VAD Coordinator  Office: [phone]  24/7 Pager: [phone]

## 2022-11-12 ENCOUNTER — Other Ambulatory Visit (HOSPITAL_COMMUNITY): Payer: 59

## 2022-11-19 ENCOUNTER — Encounter (HOSPITAL_COMMUNITY): Payer: 59

## 2022-11-19 ENCOUNTER — Other Ambulatory Visit (HOSPITAL_COMMUNITY): Payer: Self-pay | Admitting: *Deleted

## 2022-11-19 DIAGNOSIS — Z95811 Presence of heart assist device: Secondary | ICD-10-CM

## 2022-11-19 DIAGNOSIS — Z7901 Long term (current) use of anticoagulants: Secondary | ICD-10-CM

## 2022-11-22 ENCOUNTER — Ambulatory Visit (HOSPITAL_COMMUNITY): Payer: Self-pay | Admitting: Pharmacist

## 2022-11-22 ENCOUNTER — Ambulatory Visit (HOSPITAL_COMMUNITY)
Admission: RE | Admit: 2022-11-22 | Discharge: 2022-11-22 | Disposition: A | Discharge status: Home / Self Care | Payer: 59 | Source: Ambulatory Visit | Attending: Internal Medicine | Admitting: Internal Medicine

## 2022-11-22 DIAGNOSIS — Z95811 Presence of heart assist device: Secondary | ICD-10-CM | POA: Insufficient documentation

## 2022-11-22 DIAGNOSIS — Z4801 Encounter for change or removal of surgical wound dressing: Secondary | ICD-10-CM | POA: Insufficient documentation

## 2022-11-22 DIAGNOSIS — Z7901 Long term (current) use of anticoagulants: Secondary | ICD-10-CM | POA: Insufficient documentation

## 2022-11-22 LAB — PROTIME-INR
INR: 1.5 — ABNORMAL HIGH (ref 0.8–1.2)
Prothrombin Time: 18.6 seconds — ABNORMAL HIGH (ref 11.4–15.2)

## 2022-11-22 NOTE — Progress Notes (Signed)
Pt presents to VAD Clinic for dressing change and INR.   Pt denies any issues with VAD equipment or VAD exit site.   Exit site care: Existing VAD dressing removed and site care performed using sterile technique. Drive line exit site cleaned with Chlora prep applicators x 2, allowed to dry, and Sorbaview dressing with Silverlon patch applied. Exit site healed and incorporated, the velour is fully implanted at exit site. No redness, tenderness, drainage, foul odor or rash noted. Drive line anchor re-applied. 2 large tegaderms placed over dressing. Pt denies fever or chills.    Plan:  Return in 10 days for dressing change in VAD clinic.   Hessie Diener RN VAD Coordinator  Office: [phone]  24/7 Pager: [phone]

## 2022-11-26 ENCOUNTER — Other Ambulatory Visit (HOSPITAL_COMMUNITY): Payer: Self-pay

## 2022-11-26 DIAGNOSIS — Z95811 Presence of heart assist device: Secondary | ICD-10-CM

## 2022-11-26 DIAGNOSIS — Z7901 Long term (current) use of anticoagulants: Secondary | ICD-10-CM

## 2022-12-02 ENCOUNTER — Ambulatory Visit (HOSPITAL_COMMUNITY)
Admission: RE | Admit: 2022-12-02 | Discharge: 2022-12-02 | Disposition: A | Discharge status: Home / Self Care | Payer: 59 | Source: Ambulatory Visit | Attending: Cardiology | Admitting: Cardiology

## 2022-12-02 ENCOUNTER — Ambulatory Visit (HOSPITAL_COMMUNITY): Payer: Self-pay | Admitting: Pharmacist

## 2022-12-02 DIAGNOSIS — Z95811 Presence of heart assist device: Secondary | ICD-10-CM

## 2022-12-02 DIAGNOSIS — Z4801 Encounter for change or removal of surgical wound dressing: Secondary | ICD-10-CM | POA: Insufficient documentation

## 2022-12-02 DIAGNOSIS — Z7901 Long term (current) use of anticoagulants: Secondary | ICD-10-CM | POA: Diagnosis not present

## 2022-12-02 LAB — PROTIME-INR
INR: 2 — ABNORMAL HIGH (ref 0.8–1.2)
Prothrombin Time: 22.5 seconds — ABNORMAL HIGH (ref 11.4–15.2)

## 2022-12-02 NOTE — Addendum Note (Signed)
Encounter addended by: Flora Lipps, RN on: 12/02/2022 1:14 PM  Actions taken: Clinical Note Signed

## 2022-12-02 NOTE — Addendum Note (Signed)
Encounter addended by: Flora Lipps, RN on: 12/02/2022 1:17 PM  Actions taken: Clinical Note Signed

## 2022-12-02 NOTE — Patient Instructions (Signed)
Try over the counter Tums of Famotidine for acid reflux

## 2022-12-02 NOTE — Addendum Note (Signed)
Encounter addended by: Flora Lipps, RN on: 12/02/2022 10:09 AM  Actions taken: Clinical Note Signed

## 2022-12-02 NOTE — Progress Notes (Addendum)
Pt presents to VAD Clinic for dressing change and INR.   Pt denies any issues with VAD equipment or VAD exit site.   Exit site care: Existing VAD dressing removed and site care performed using sterile technique. Drive line exit site cleaned with Chlora prep applicators x 2, allowed to dry, and Sorbaview dressing with Silverlon patch applied. Exit site healed and incorporated, the velour is fully implanted at exit site. No redness, tenderness, drainage, foul odor or rash noted. Drive line anchor re-applied. 3 large tegaderms placed over dressing. Pt denies fever or chills.    Plan:  Return in 10 days for dressing change in VAD clinic. Please use over the counter medications for acid reflux and avoid spicy foods.  [name] RN,BSN VAD Coordinator  Office: [phone]  24/7 Pager: [phone]

## 2022-12-02 NOTE — Addendum Note (Signed)
Encounter addended by: Flora Lipps, RN on: 12/02/2022 11:00 AM  Actions taken: Clinical Note Signed

## 2022-12-08 ENCOUNTER — Other Ambulatory Visit (HOSPITAL_COMMUNITY): Payer: Self-pay

## 2022-12-08 DIAGNOSIS — Z7901 Long term (current) use of anticoagulants: Secondary | ICD-10-CM

## 2022-12-08 DIAGNOSIS — Z95811 Presence of heart assist device: Secondary | ICD-10-CM

## 2022-12-09 ENCOUNTER — Other Ambulatory Visit (HOSPITAL_COMMUNITY): Payer: 59

## 2022-12-13 ENCOUNTER — Ambulatory Visit (HOSPITAL_COMMUNITY)
Admission: RE | Admit: 2022-12-13 | Discharge: 2022-12-13 | Disposition: A | Discharge status: Home / Self Care | Payer: 59 | Source: Ambulatory Visit | Attending: Cardiology | Admitting: Cardiology

## 2022-12-13 ENCOUNTER — Ambulatory Visit (HOSPITAL_COMMUNITY): Payer: Self-pay | Admitting: Student-PharmD

## 2022-12-13 DIAGNOSIS — Z95811 Presence of heart assist device: Secondary | ICD-10-CM

## 2022-12-13 DIAGNOSIS — Z7901 Long term (current) use of anticoagulants: Secondary | ICD-10-CM

## 2022-12-13 DIAGNOSIS — I5022 Chronic systolic (congestive) heart failure: Secondary | ICD-10-CM

## 2022-12-13 LAB — PROTIME-INR
INR: 2.1 — ABNORMAL HIGH (ref 0.8–1.2)
Prothrombin Time: 23.8 seconds — ABNORMAL HIGH (ref 11.4–15.2)

## 2022-12-13 NOTE — Addendum Note (Signed)
Encounter addended by: Lebron Quam, RN on: 12/13/2022 5:11 PM  Actions taken: Visit diagnoses modified, Order list changed, Diagnosis association updated

## 2022-12-13 NOTE — Addendum Note (Signed)
Encounter addended by: Lebron Quam, RN on: 12/13/2022 5:18 PM  Actions taken: Clinical Note Signed

## 2022-12-13 NOTE — Progress Notes (Addendum)
Pt presents to VAD Clinic for dressing change and INR.   Pt denies any issues with VAD equipment or VAD exit site.   Exit site care: Existing VAD dressing removed and site care performed using sterile technique. Drive line exit site cleaned with Chlora prep applicators x 2, allowed to dry, and Sorbaview dressing with Silverlon patch applied. Exit site healed and incorporated, the velour is fully implanted at exit site. No redness, tenderness, drainage, foul odor or rash noted. Drive line anchor re-applied. 2 large tegaderms placed over dressing. Pt denies fever or chills.    Plan:  Return in 10 days for dressing change in VAD clinic. Podiatry referral sent today per pts request for toenail care.   Carlton Adam RN,BSN VAD Coordinator  Office: [phone]  24/7 Pager: [phone]

## 2022-12-15 ENCOUNTER — Other Ambulatory Visit (HOSPITAL_COMMUNITY): Payer: 59

## 2022-12-17 ENCOUNTER — Other Ambulatory Visit (HOSPITAL_COMMUNITY): Payer: Self-pay | Admitting: Unknown Physician Specialty

## 2022-12-17 DIAGNOSIS — Z95811 Presence of heart assist device: Secondary | ICD-10-CM

## 2022-12-17 DIAGNOSIS — Z7901 Long term (current) use of anticoagulants: Secondary | ICD-10-CM

## 2022-12-22 ENCOUNTER — Ambulatory Visit (HOSPITAL_COMMUNITY): Payer: Self-pay | Admitting: Pharmacist

## 2022-12-22 ENCOUNTER — Ambulatory Visit (HOSPITAL_COMMUNITY)
Admission: RE | Admit: 2022-12-22 | Discharge: 2022-12-22 | Disposition: A | Discharge status: Home / Self Care | Payer: 59 | Source: Ambulatory Visit | Attending: Internal Medicine | Admitting: Internal Medicine

## 2022-12-22 DIAGNOSIS — Z7901 Long term (current) use of anticoagulants: Secondary | ICD-10-CM | POA: Diagnosis present

## 2022-12-22 DIAGNOSIS — Z95811 Presence of heart assist device: Secondary | ICD-10-CM | POA: Diagnosis present

## 2022-12-22 DIAGNOSIS — Z4509 Encounter for adjustment and management of other cardiac device: Secondary | ICD-10-CM | POA: Diagnosis not present

## 2022-12-22 LAB — PROTIME-INR
INR: 2.2 — ABNORMAL HIGH (ref 0.8–1.2)
Prothrombin Time: 24.5 seconds — ABNORMAL HIGH (ref 11.4–15.2)

## 2022-12-22 NOTE — Progress Notes (Signed)
Pt presents to VAD Clinic for dressing change and INR.   Pt denies any issues with VAD equipment or VAD exit site.   Exit site care: Existing VAD dressing removed and site care performed using sterile technique. Drive line exit site cleaned with Chlora prep applicators x 2, allowed to dry, and Sorbaview dressing with Silverlon patch applied. Exit site healed and incorporated, the velour is fully implanted at exit site. No redness, tenderness, drainage, foul odor or rash noted. Drive line anchor re-applied. 2 large tegaderms placed over dressing. Pt denies fever or chills.    Plan:  Return in 10 days for dressing change in VAD clinic.  [name] RN,BSN VAD Coordinator  Office: [phone]  24/7 Pager: [phone]

## 2022-12-23 ENCOUNTER — Other Ambulatory Visit (HOSPITAL_COMMUNITY): Payer: Self-pay

## 2022-12-23 DIAGNOSIS — Z7901 Long term (current) use of anticoagulants: Secondary | ICD-10-CM

## 2022-12-23 DIAGNOSIS — Z95811 Presence of heart assist device: Secondary | ICD-10-CM

## 2022-12-24 ENCOUNTER — Ambulatory Visit (INDEPENDENT_AMBULATORY_CARE_PROVIDER_SITE_OTHER): Payer: 59 | Admitting: Podiatry

## 2022-12-24 DIAGNOSIS — Z91199 Patient's noncompliance with other medical treatment and regimen due to unspecified reason: Secondary | ICD-10-CM

## 2022-12-24 NOTE — Progress Notes (Signed)
No show for apt.

## 2022-12-30 ENCOUNTER — Ambulatory Visit (HOSPITAL_COMMUNITY): Payer: Self-pay | Admitting: Pharmacist

## 2022-12-30 ENCOUNTER — Ambulatory Visit (HOSPITAL_COMMUNITY)
Admission: RE | Admit: 2022-12-30 | Discharge: 2022-12-30 | Disposition: A | Discharge status: Home / Self Care | Payer: 59 | Source: Ambulatory Visit | Attending: Cardiology | Admitting: Cardiology

## 2022-12-30 DIAGNOSIS — Z95811 Presence of heart assist device: Secondary | ICD-10-CM | POA: Diagnosis not present

## 2022-12-30 DIAGNOSIS — Z7901 Long term (current) use of anticoagulants: Secondary | ICD-10-CM | POA: Diagnosis not present

## 2022-12-30 LAB — PROTIME-INR
INR: 2.3 — ABNORMAL HIGH (ref 0.8–1.2)
Prothrombin Time: 25.8 seconds — ABNORMAL HIGH (ref 11.4–15.2)

## 2022-12-30 NOTE — Progress Notes (Signed)
Pt presents to VAD Clinic for dressing change and INR.   Pt denies any issues with VAD equipment or VAD exit site.   Exit site care: Existing VAD dressing removed and site care performed using sterile technique. Silverlon is soaked with drainage and tegaderm is wet underneath. Drive line exit site cleaned with Chlora prep applicators x 2, Vashe moistened 2 x 2 placed around driveline, and gauze dressing applied. Exit site healed and incorporated, the velour is fully implanted at exit site. Small amount of serous drainage. Slight redness, tenderness, foul odor. No rash noted. Drive line anchor re-applied. 3 large tegaderms placed over dressing. Pt denies fever or chills. Pt instructed not to shower this week       Plan:  Return next Friday for a full visit with DR Bensimhon.  Carlton Adam RN,BSN VAD Coordinator  Office: [phone]  24/7 Pager: [phone]

## 2022-12-31 ENCOUNTER — Other Ambulatory Visit (HOSPITAL_COMMUNITY): Payer: 59

## 2023-01-05 ENCOUNTER — Encounter (HOSPITAL_COMMUNITY): Payer: Self-pay

## 2023-01-05 ENCOUNTER — Other Ambulatory Visit: Payer: Self-pay

## 2023-01-05 ENCOUNTER — Other Ambulatory Visit (HOSPITAL_COMMUNITY): Payer: Self-pay

## 2023-01-05 MED ORDER — DOXYCYCLINE HYCLATE 50 MG PO CAPS: 100.0000 mg | ORAL_CAPSULE | Freq: Two times a day (BID) | ORAL | 0 refills | Status: DC

## 2023-01-05 MED ORDER — CEFADROXIL 500 MG PO CAPS: 1000.0000 mg | ORAL_CAPSULE | Freq: Two times a day (BID) | ORAL | Status: DC

## 2023-01-05 NOTE — Progress Notes (Signed)
Pt called VAD Coordinator reporting tenderness at drive line site. Increased moisture noted upon dressing change last week. Pt instructed not to shower but states he has taken one shower since last dressing change. Discussed with Dr. [name] will plan to start antibiotics per protocol. Pt already on Keflex 500mg  TID for previous drive line infection. Discussed with Rexene Alberts NP with infectious disease suggested adding Doxycycline 100mg  BID to add MRSA/MRSE coverage and attempt to obtain culture at upcoming appointment on Friday. Rx sent to [facility] per pt's request.  [name] RN, BSN VAD Coordinator 24/7 Pager [phone]

## 2023-01-06 ENCOUNTER — Other Ambulatory Visit (HOSPITAL_COMMUNITY): Payer: Self-pay

## 2023-01-06 DIAGNOSIS — Z7901 Long term (current) use of anticoagulants: Secondary | ICD-10-CM

## 2023-01-06 DIAGNOSIS — Z95811 Presence of heart assist device: Secondary | ICD-10-CM

## 2023-01-07 ENCOUNTER — Ambulatory Visit (HOSPITAL_COMMUNITY)
Admission: RE | Admit: 2023-01-07 | Discharge: 2023-01-07 | Disposition: A | Discharge status: Home / Self Care | Payer: 59 | Source: Ambulatory Visit | Attending: Internal Medicine | Admitting: Internal Medicine

## 2023-01-07 ENCOUNTER — Ambulatory Visit (HOSPITAL_COMMUNITY): Payer: Self-pay | Admitting: Pharmacist

## 2023-01-07 VITALS — BP 110/0 | HR 69 | Ht 74.0 in | Wt 212.0 lb

## 2023-01-07 DIAGNOSIS — I10 Essential (primary) hypertension: Secondary | ICD-10-CM

## 2023-01-07 DIAGNOSIS — I5022 Chronic systolic (congestive) heart failure: Secondary | ICD-10-CM

## 2023-01-07 DIAGNOSIS — Z7901 Long term (current) use of anticoagulants: Secondary | ICD-10-CM | POA: Diagnosis not present

## 2023-01-07 DIAGNOSIS — Z95811 Presence of heart assist device: Secondary | ICD-10-CM | POA: Diagnosis not present

## 2023-01-07 DIAGNOSIS — T827XXA Infection and inflammatory reaction due to other cardiac and vascular devices, implants and grafts, initial encounter: Secondary | ICD-10-CM | POA: Insufficient documentation

## 2023-01-07 DIAGNOSIS — B965 Pseudomonas (aeruginosa) (mallei) (pseudomallei) as the cause of diseases classified elsewhere: Secondary | ICD-10-CM | POA: Insufficient documentation

## 2023-01-07 DIAGNOSIS — Z720 Tobacco use: Secondary | ICD-10-CM

## 2023-01-07 LAB — BASIC METABOLIC PANEL
Anion gap: 15 (ref 5–15)
BUN: 12 mg/dL (ref 6–20)
CO2: 19 mmol/L — ABNORMAL LOW (ref 22–32)
Calcium: 9 mg/dL (ref 8.9–10.3)
Chloride: 104 mmol/L (ref 98–111)
Creatinine, Ser: 0.97 mg/dL (ref 0.61–1.24)
GFR, Estimated: >60 mL/min (ref >60)
Glucose, Bld: 100 mg/dL — ABNORMAL HIGH (ref 70–99)
Potassium: 3.9 mmol/L (ref 3.5–5.1)
Sodium: 138 mmol/L (ref 135–145)

## 2023-01-07 LAB — PROTIME-INR
INR: 2.2 — ABNORMAL HIGH (ref 0.8–1.2)
Prothrombin Time: 24.4 seconds — ABNORMAL HIGH (ref 11.4–15.2)

## 2023-01-07 LAB — CBC
HCT: 36.4 % — ABNORMAL LOW (ref 39.0–52.0)
Hemoglobin: 11.5 g/dL — ABNORMAL LOW (ref 13.0–17.0)
MCH: 27.3 pg (ref 26.0–34.0)
MCHC: 31.6 g/dL (ref 30.0–36.0)
MCV: 86.3 fL (ref 80.0–100.0)
Platelets: 218 10*3/uL (ref 150–400)
RBC: 4.22 MIL/uL (ref 4.22–5.81)
RDW: 13.1 % (ref 11.5–15.5)
WBC: 3.8 10*3/uL — ABNORMAL LOW (ref 4.0–10.5)
nRBC: 0 % (ref 0.0–0.2)

## 2023-01-07 LAB — LACTATE DEHYDROGENASE: LDH: 171 U/L (ref 98–192)

## 2023-01-07 MED ORDER — DOXAZOSIN MESYLATE 2 MG PO TABS
1.0000 mg | ORAL_TABLET | Freq: Every day | ORAL | 3 refills | Status: DC
Start: 2023-01-07 — End: 2023-03-07

## 2023-01-07 NOTE — Patient Instructions (Addendum)
Restart Cardura 1 mg at bedtime (half pill) Return to clinic for dressing change on Tuesday Return to clinic in 2 mo to see Dr [name]

## 2023-01-07 NOTE — Progress Notes (Signed)
Patient presents for 2 month f/u  in VAD clinic today alone. Denies any issues with VAD or equipment.   Pt states he is feeling great. Denies lightheadedness, dizziness, falls, heart failure symptoms, and signs of bleeding.   He says he has not had a cigarette for a couple mths. He is not wearing patch (irritates skin), but credits family and friends for their support. But tells me that he does have to "take a hit from my vape every now and then."  Pt is not taking his Cardura 1mg  at bedtime. He tells me that he didn't know he was supposed to be taking this medication. We will restart per Dr [name] as BP is slightly elevated today. A new script was sent to [facility].  Pt has been having pain / drainage in his driveline. Pt was started on Doxycycline yesterday by ID team in addition to the keflex he already takes. Since starting the Doxy yesterday he states that his pain is improved. Dr Donata Clay saw pt to assess driveline condition. See below for full wound note.   Vital Signs: Doppler: 110 Automatic BP: 114/92 (100) HR: 69  SPO2: 96   Weight: 212 lbs w/ eqt Last wt: 212.4 lbs w/ equip   VAD Indication: Destination Therapy    VAD interrogation & Equipment Management: Speed: 6500 Flow: 6 Power: 5.9w    PI: 2.9 Hct: 38  Alarms: none Events: >50 daily  Primary Controller:  Replace back up battery in 25 months Back up controller: Replace back up battery in 11 months  Annual Equipment Maintenance on UBC/PM was performed 10/25/22   I reviewed the LVAD parameters from today and compared the results to the patient's prior recorded data.  LVAD interrogation was NEGATIVE for significant power changes, NEGATIVE for clinical alarms and STABLE for PI events/speed drops. No programming changes were made and pump is functioning within specified parameters.   Exit Site Care: Drive line is being maintained weekly by VAD coordinators. Existing VAD dressing removed and site care performed  using sterile technique. Drive line exit site cleaned with Chlora prep applicators x 2, RINSED WITH SALINE allowed to dry, and gauze dressing reapplied. Anchor reapplied. Drive line exit site unincorporated, the velour is fully implanted at exit site. Site tunnels approx 2 cm. Culture obtained today. Moderate amount of brown drainage with a foul odor, slight erythema, no tenderness. Pt denies fever or chills. Pt informed that he will need to come to clinic on Tuesday for a dressing change to reassess.     Device: none  BP & Labs:  Doppler 100 - reflecting modified systolic   Hgb 11.5 - No S/S of bleeding. Specifically denies melena/BRBPR or nosebleeds.    LDH 171 - established baseline of 200-300. Denies tea-colored urine. No power elevations noted on interrogation.    Patient Instructions:  Restart Cardura 1 mg at bedtime (half pill) Return to clinic for dressing change on Tuesday Return to clinic in 2 mo to see Dr [name]  Carlton Adam RN VAD Coordinator  Office: [phone]  24/7 Pager: [phone]

## 2023-01-09 LAB — AEROBIC CULTURE W GRAM STAIN (SUPERFICIAL SPECIMEN): Gram Stain: NONE SEEN

## 2023-01-09 NOTE — Progress Notes (Signed)
VAD Clinic Note    ID:  Jonathan Wood, Bouder Jan 11, 1971, MRN [id number]  Location: Home  Provider location: [place] [facility] Type of Visit: Established patient  PCP: [name]  Cardiologist:  Arvilla Meres, MD Primary HF: Dr [name]   Chief Complaint: Heart Failure/LVAD  History of Present Illness:  Jonathan Wood is a 52 y.o. 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  Admitted 4/20 with volume overload and anemia. Diuresed with IV lasix and transitioned to torsemide 20 mg daily. GI consulted EGD with gastritis and nonbleeding duodenal ulcers, colonoscopy with 5 polyps removed.  Admitted 12/20 for DL infection with overlying cellulitis and volume overload.   Admitted 4/21 for DL infection with large subxiphoid abscess. Underwent several debridements and eventually a pec muscle flap. Wound cx + MSSA. Initially on ancef and rifabutin but expanded to cefipime then changed to IV ancef and po rifabutin. Now on oral keflex.  Readmitted 6/21 with superficial abscess and tachypnea. Superficial abscess I&D'd and packed. CT scan done and showed possible deeper fluid collection. Reviewed with TCTS who felt it was muscle flap. Sent to IR for possible drainage but u/s confirmed it was the muscle flap and no drainable fluid collection. Wound cx negative. ID saw and recommended continuing Keflex.   Admitted 8/21 with lactic acidosis due to cardiogenic shock. Started on 0.25 of milrinone and NE. Ramp echo on 8/13 with severe RV dysfunction. Speed increased 5600-> 6300. Tolerated ok. Repeat ramp echo on 8/20 speed increased to 6500. Diuresed over 40 pounds.   Admitted 10/21 with  ETOH pancreatitis. Gallbladder ok. Resolved with medical management    Here for routine f/u. Feels great. 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 No CP or SOB. Has mot smoked for a few months. Having some  drainage from DL. No f/c. No bleeding No VAD alarms.   VAD Indication: Destination Therapy    VAD interrogation & Equipment Management: Speed: 6500 Flow: 5.4 Power: 5.6w    PI: 1.6 Hct: 39   Alarms: mult "no external power" alarms - see above Events: >50 daily   Primary Controller:  Replace back up battery in 27 months Back up controller: Replace back up battery in 13 months   Annual Equipment Maintenance on UBC/PM was performed 10/25/22    I reviewed the LVAD parameters from today and compared the results to the patient's prior recorded data.  LVAD interrogation was NEGATIVE for significant power changes, NEGATIVE for clinical alarms and STABLE for PI events/speed drops. No programming changes were made and pump is functioning within specified parameters.   Past Medical History:  Diagnosis Date   Asthma    CHF (congestive heart failure) (HCC)    a. 09/2016: EF 20-25% with cath showing normal cors   GERD (gastroesophageal reflux disease)    History of hiatal hernia    LVAD (left ventricular assist device) present (HCC)    OSA on CPAP 09/06/2018   Severe OSA with AHI 68/hr on CPAP at 12cm H2O   Past Surgical History:  Procedure Laterality Date   APPLICATION OF A-CELL OF CHEST/ABDOMEN N/A 08/24/2019   Procedure: Application Of A-Cell Of Chest/Abdomen;  Surgeon: Kerin Perna, MD;  Location: [facility] OR;  Service: Thoracic;  Laterality: N/A;   APPLICATION OF A-CELL OF CHEST/ABDOMEN N/A 09/05/2019   Procedure: Application Of A-Cell Of Chest/Abdomen;  Surgeon: Peggye Form, DO;  Location: [facility]  OR;  Service: Government social research officer;  Laterality: N/A;   APPLICATION OF A-CELL OF CHEST/ABDOMEN  09/03/2019   Procedure: Application Of A-Cell Of Chest/Abdomen;  Surgeon: Kerin Perna, MD;  Location: [facility] OR;  Service: Thoracic;;   APPLICATION OF WOUND VAC N/A 08/22/2019   Procedure: Debridement, Irrigation and Packing of Abdominal Incision.;  Surgeon: Kerin Perna, MD;  Location: [facility] OR;  Service:  Thoracic;  Laterality: N/A;   APPLICATION OF WOUND VAC N/A 08/24/2019   Procedure: Irrigation and Debridement with WOUND VAC APPLICATION;  Surgeon: Kerin Perna, MD;  Location: [facility] OR;  Service: Thoracic;  Laterality: N/A;   APPLICATION OF WOUND VAC N/A 08/30/2019   Procedure: APPLICATION OF WOUND VAC;  Surgeon: Kerin Perna, MD;  Location: [facility] OR;  Service: Thoracic;  Laterality: N/A;   APPLICATION OF WOUND VAC N/A 09/03/2019   Procedure: WOUND VAC CHANGE;  Surgeon: Kerin Perna, MD;  Location: [facility] OR;  Service: Thoracic;  Laterality: N/A;   BIOPSY  08/23/2018   Procedure: BIOPSY;  Surgeon: Lemar Lofty., MD;  Location: [facility] ENDOSCOPY;  Service: Gastroenterology;;   COLONOSCOPY N/A 08/23/2018   Procedure: COLONOSCOPY;  Surgeon: Lemar Lofty., MD;  Location: [facility] ENDOSCOPY;  Service: Gastroenterology;  Laterality: N/A;   ENTEROSCOPY N/A 08/23/2018   Procedure: ENTEROSCOPY;  Surgeon: Meridee Score Netty Starring., MD;  Location: [facility] ENDOSCOPY;  Service: Gastroenterology;  Laterality: N/A;   HEMOSTASIS CLIP PLACEMENT  08/23/2018   Procedure: HEMOSTASIS CLIP PLACEMENT;  Surgeon: Lemar Lofty., MD;  Location: [facility] ENDOSCOPY;  Service: Gastroenterology;;   IABP INSERTION N/A 09/05/2017   Procedure: IABP INSERTION;  Surgeon: Dolores Patty, MD;  Location: [facility] INVASIVE CV LAB;  Service: Cardiovascular;  Laterality: N/A;   INSERTION OF IMPLANTABLE LEFT VENTRICULAR ASSIST DEVICE N/A 09/06/2017   Procedure: INSERTION OF IMPLANTABLE LEFT VENTRICULAR ASSIST DEVICE - HM3;  Surgeon: Kerin Perna, MD;  Location: [facility] OR;  Service: Open Heart Surgery;  Laterality: N/A;  HM3   MUSCLE FLAP CLOSURE N/A 09/05/2019   Procedure: MUSCLE FLAP CLOSURE;  Surgeon: Peggye Form, DO;  Location: [facility] OR;  Service: Plastics;  Laterality: N/A;   NASAL FRACTURE SURGERY  1987   POLYPECTOMY  08/23/2018   Procedure: POLYPECTOMY;  Surgeon: Mansouraty, Netty Starring., MD;  Location: [facility] ENDOSCOPY;  Service:  Gastroenterology;;   RIGHT HEART CATH N/A 08/30/2017   Procedure: RIGHT HEART CATH;  Surgeon: Dolores Patty, MD;  Location: [facility] INVASIVE CV LAB;  Service: Cardiovascular;  Laterality: N/A;   RIGHT/LEFT HEART CATH AND CORONARY ANGIOGRAPHY N/A 10/08/2016   Procedure: Right/Left Heart Cath and Coronary Angiography;  Surgeon: Orpah Cobb, MD;  Location: [facility] INVASIVE CV LAB;  Service: Cardiovascular;  Laterality: N/A;   STERNAL WOUND DEBRIDEMENT N/A 08/20/2019   Procedure: DEBRIDEMENT OF LVAD DRIVELINE TUNNEL;  Surgeon: Kerin Perna, MD;  Location: [facility] OR;  Service: Thoracic;  Laterality: N/A;   STERNAL WOUND DEBRIDEMENT N/A 09/05/2019   Procedure: DEBRIDEMENT AND CLOSURE OF ABDOMINAL WOUND;  Surgeon: Peggye Form, DO;  Location: [facility] OR;  Service: Plastics;  Laterality: N/A;   SUBMUCOSAL TATTOO INJECTION  08/23/2018   Procedure: SUBMUCOSAL TATTOO INJECTION;  Surgeon: Meridee Score Netty Starring., MD;  Location: [facility] ENDOSCOPY;  Service: Gastroenterology;;   TEE WITHOUT CARDIOVERSION N/A 09/06/2017   Procedure: TRANSESOPHAGEAL ECHOCARDIOGRAM (TEE);  Surgeon: Donata Clay, Theron Arista, MD;  Location: [facility] OR;  Service: Open Heart Surgery;  Laterality: N/A;   WOUND DEBRIDEMENT N/A 08/30/2019   Procedure: Debridement Abdominal Wound;  Surgeon: Kerin Perna,  MD;  Location: [facility] OR;  Service: Thoracic;  Laterality: N/A;     Current Outpatient Medications  Medication Sig Dispense Refill   acetaminophen (TYLENOL) 500 MG tablet Take 1,500 mg by mouth 3 (three) times daily as needed for headache (pain).     cephALEXin (KEFLEX) 500 MG capsule Take 1 capsule (500 mg total) by mouth 3 (three) times daily. 90 capsule 11   doxycycline (VIBRAMYCIN) 50 MG capsule Take 2 capsules (100 mg total) by mouth 2 (two) times daily for 14 days. 56 capsule 0   ENTRESTO 97-103 MG TAKE 1 TABLET BY MOUTH TWICE DAILY 60 tablet 6   gabapentin (NEURONTIN) 300 MG capsule TAKE ONE CAPSULE BY MOUTH THREE TIMES DAILY AT 9am, 3pm, AND 9pm 90  capsule 5   pantoprazole (PROTONIX) 40 MG tablet Take 1 tablet (40 mg total) by mouth 2 (two) times daily. 180 tablet 3   sildenafil (REVATIO) 20 MG tablet Take 1 tablet (20 mg total) by mouth 3 (three) times daily. 90 tablet 11   sildenafil (VIAGRA) 100 MG tablet Take 1 tablet (100 mg total) by mouth daily as needed for erectile dysfunction. 10 tablet 5   spironolactone (ALDACTONE) 25 MG tablet Take 0.5 tablets (12.5 mg total) by mouth daily. 45 tablet 6   warfarin (COUMADIN) 5 MG tablet Take 1.5 tablets (7.5 mg total) by mouth daily at 4 PM.  7.5 mg (1.5 tab) all days OR as directed by HF clinic 90 tablet 5   albuterol (PROAIR HFA) 108 (90 Base) MCG/ACT inhaler Inhale 2 puffs into the lungs every 6 (six) hours as needed for wheezing or shortness of breath. (Patient not taking: Reported on 11/11/2021) 1 each 6   doxazosin (CARDURA) 2 MG tablet Take 0.5 tablets (1 mg total) by mouth at bedtime. 45 tablet 3   enoxaparin (LOVENOX) 40 MG/0.4ML injection Inject 0.4 mLs (40 mg total) into the skin every 12 (twelve) hours. (Patient not taking: Reported on 01/27/2022) 4 mL 0   torsemide (DEMADEX) 20 MG tablet Take 1 tablet (20 mg total) by mouth every other day. (Patient not taking: Reported on 06/26/2020) 90 tablet 3   traMADol HCl 100 MG TABS Take 100 mg by mouth every 8 (eight) hours as needed. (Patient not taking: Reported on 09/07/2021) 10 tablet 0   No current facility-administered medications for this encounter.    Allergies:   Patient has no known allergies.   Social History:  The patient  reports that he has quit smoking. His smoking use included cigarettes. He has a 12.5 pack-year smoking history. He has never used smokeless tobacco. He reports that he does not currently use alcohol. He reports that he does not currently use drugs after having used the following drugs: Marijuana.   Family History:  The patient's family history includes Alcohol abuse in his cousin; Hypertension in his father.    ROS:  Please see the history of present illness.   All other systems are personally reviewed and negative.   Vital Signs: Doppler: 100 Automatic BP: 101/62 (88) HR: 68 sinus with PVCs SPO2: unable to obtain   Weight: 212.4 lbs w/ eqt Last wt: 213.6 lbs w/ equip       Exam: General:  NAD.  HEENT: normal  Neck: supple. JVP not elevated.  Carotids 2+ bilat; no bruits. No lymphadenopathy or thryomegaly appreciated. Cor: LVAD hum.  Lungs: Clear. Abdomen: obese soft, nontender, non-distended. No hepatosplenomegaly. No bruits or masses. Good bowel sounds. Driveline site tunnels 2cm with some  brown drainage Anchor in place.  Extremities: no cyanosis, clubbing, rash. Warm no edema  Neuro: alert & oriented x 3. No focal deficits. Moves all 4 without problem     Recent Labs: 08/11/2022: ALT 27 01/07/2023: BUN 12; Creatinine, Ser 0.97; Hemoglobin 11.5; Platelets 218; Potassium 3.9; Sodium 138    Wt Readings from Last 3 Encounters:  01/07/23 96.2 kg (212 lb)  10/25/22 96.3 kg (212 lb 6.4 oz)  09/03/22 96.9 kg (213 lb 9.6 oz)      ASSESSMENT AND PLAN:  1. 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 Treated with milrinone and then weaned off.  Diuresed 40 pounds. VAD speed increased - He continues to do well from HF standpoint. NYHA I - Volume status ok - Continue Entresto and spiro.  - We have recently been discussing transplant. He continues to be very motivated. Now has quit smoking x 2 months. Will start checking monthly cotinine levels   3. HM3 LVAD 09/06/2017.  - VAD interrogated personally. Parameters stable. - LDH 171 - INR 2.2  Discussed dosing with PharmD personally. - Off ASA with PUD.  - DL site with some drainage. Will start doxy and follow. May need debridement and IV abx  4.  Essential HTN - BP mildly elevated. Restarted cardura 1mg  daily   5. H/o ETOH  pancreatitis, 11/21 - remains quit from drinking (was stressed about his son) - see plan as above  6. Recurrent DL infection with subxiphoid abscess - s/p debridement and pec flap in 4/21 - remains on keflex.  - site with brown drainage today with mild tunneling. Dressing changed and cultures drawn Start doxy. Low threshold to start IV abx. Will bring back to clinic next week to reassess  7.  OSA - sleep study with very severe OSA (AHI 69/hr) - He has refused therapy multiple times but seems to have improved with weight loss and ETOH cessation.   8.  H/o GI bleed  - 08/2018 EGD showed gastritis and nonbleeding duodenal ulcers, colonoscopy with 5 polyps removed.  - Remains on PPI. No recent GI bleeding - hgb stable at 11.5  9. Tobacco Abuse  - has now quit for 2 months. Congratulated him - will start checking monthly cotinine levels at next visit.    Total time spent 40 minutes. Over half that time spent discussing above.   Signed, Arvilla Meres, MD  01/09/2023 8:27 PM  [facility] [address] [facility] [place] [address] [phone] (office) [phone] (fax)

## 2023-01-10 ENCOUNTER — Telehealth: Payer: Self-pay

## 2023-01-10 ENCOUNTER — Other Ambulatory Visit (HOSPITAL_COMMUNITY): Payer: Self-pay | Admitting: *Deleted

## 2023-01-10 ENCOUNTER — Other Ambulatory Visit: Payer: Self-pay | Admitting: Infectious Diseases

## 2023-01-10 ENCOUNTER — Telehealth (INDEPENDENT_AMBULATORY_CARE_PROVIDER_SITE_OTHER): Payer: 59 | Admitting: Infectious Diseases

## 2023-01-10 DIAGNOSIS — Z95811 Presence of heart assist device: Secondary | ICD-10-CM

## 2023-01-10 DIAGNOSIS — T827XXA Infection and inflammatory reaction due to other cardiac and vascular devices, implants and grafts, initial encounter: Secondary | ICD-10-CM | POA: Diagnosis not present

## 2023-01-10 DIAGNOSIS — Z7901 Long term (current) use of anticoagulants: Secondary | ICD-10-CM

## 2023-01-10 MED ORDER — CIPROFLOXACIN HCL 750 MG PO TABS: 750.0000 mg | ORAL_TABLET | Freq: Two times a day (BID) | ORAL | 0 refills | Status: DC

## 2023-01-10 NOTE — Progress Notes (Signed)
Please call Jonathan Wood to let him know we need to switch his antibiotics: He has a new infection with pseudomonas   1 - STOP the doxycycline  2 - START ciprofloxacin 1 tab twice a day  (I will send this in if you can verify the pharmacy please) 3 - CONTINUE the cephalexin as he has been taking three times a day   Please schedule a 2 week follow up with any of our ID providers

## 2023-01-10 NOTE — Telephone Encounter (Signed)
-----   Message from Sangaree sent at 01/10/2023  9:09 AM EDT ----- Please call Dagen to let him know we need to switch his antibiotics: He has a new infection with pseudomonas   1 - STOP the doxycycline  2 - START ciprofloxacin 1 tab twice a day  (I will send this in if you can verify the pharmacy please) 3 - CONTINUE the cephalexin as he has been taking three times a day   Please schedule a 2 week follow up with any of our ID providers

## 2023-01-10 NOTE — Telephone Encounter (Signed)
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   Given gram negative difficult to treat organism will arrange ID follow up in 2 weeks for further follow up.   There is also tunneling at the site which is new indicating concern over a more chronic process. I would recommend Jonathan Wood see him for consideration of early debridement to see if this may achieve better control here and avoid ascending infection.    Jonathan Alberts, MSN, NP-C [facility] for Infectious Disease [facility]  [email or web address] Pager: [phone] Office: [phone] [facility] Main Line: [phone] *Secure Chat Communication Welcome

## 2023-01-10 NOTE — Telephone Encounter (Signed)
Patient aware to stop doxycycline. Start ciprofloxacin 1 tab BID and continue taking cephalexin as prescribed.   Cleatus Goodin Lesli Albee, CMA

## 2023-01-11 ENCOUNTER — Other Ambulatory Visit: Payer: Self-pay | Admitting: *Deleted

## 2023-01-11 ENCOUNTER — Ambulatory Visit (HOSPITAL_COMMUNITY): Payer: Self-pay | Admitting: Pharmacist

## 2023-01-11 ENCOUNTER — Ambulatory Visit (HOSPITAL_COMMUNITY)
Admission: RE | Admit: 2023-01-11 | Discharge: 2023-01-11 | Disposition: A | Discharge status: Home / Self Care | Payer: 59 | Source: Ambulatory Visit | Attending: Cardiology | Admitting: Cardiology

## 2023-01-11 DIAGNOSIS — Z79899 Other long term (current) drug therapy: Secondary | ICD-10-CM | POA: Insufficient documentation

## 2023-01-11 DIAGNOSIS — Z8711 Personal history of peptic ulcer disease: Secondary | ICD-10-CM | POA: Insufficient documentation

## 2023-01-11 DIAGNOSIS — F17210 Nicotine dependence, cigarettes, uncomplicated: Secondary | ICD-10-CM | POA: Insufficient documentation

## 2023-01-11 DIAGNOSIS — Z7901 Long term (current) use of anticoagulants: Secondary | ICD-10-CM

## 2023-01-11 DIAGNOSIS — Z4801 Encounter for change or removal of surgical wound dressing: Secondary | ICD-10-CM | POA: Insufficient documentation

## 2023-01-11 DIAGNOSIS — Z716 Tobacco abuse counseling: Secondary | ICD-10-CM | POA: Insufficient documentation

## 2023-01-11 DIAGNOSIS — I5022 Chronic systolic (congestive) heart failure: Secondary | ICD-10-CM | POA: Insufficient documentation

## 2023-01-11 DIAGNOSIS — Z8719 Personal history of other diseases of the digestive system: Secondary | ICD-10-CM | POA: Insufficient documentation

## 2023-01-11 DIAGNOSIS — Z95811 Presence of heart assist device: Secondary | ICD-10-CM

## 2023-01-11 DIAGNOSIS — G4733 Obstructive sleep apnea (adult) (pediatric): Secondary | ICD-10-CM | POA: Insufficient documentation

## 2023-01-11 DIAGNOSIS — I110 Hypertensive heart disease with heart failure: Secondary | ICD-10-CM | POA: Insufficient documentation

## 2023-01-11 DIAGNOSIS — T827XXA Infection and inflammatory reaction due to other cardiac and vascular devices, implants and grafts, initial encounter: Secondary | ICD-10-CM

## 2023-01-11 LAB — PROTIME-INR
INR: 2.4 — ABNORMAL HIGH (ref 0.8–1.2)
Prothrombin Time: 26.4 seconds — ABNORMAL HIGH (ref 11.4–15.2)

## 2023-01-11 NOTE — Addendum Note (Signed)
Encounter addended by: Lovett Sox, MD on: 01/11/2023 12:10 PM  Actions taken: Clinical Note Signed

## 2023-01-11 NOTE — Progress Notes (Signed)
Spoke with pt regarding plan for surgery on Thursday. He is to arrive at admitting by 10:45 AM Thursday morning, with debridement scheduled at 1:15 PM with Dr Donata Clay. To be NPO past MN Wednesday night. Will plan to HOLD Coumadin and Entresto (per anesthesia request). Pt verbalized understanding of all the above.  Alyce Pagan RN VAD Coordinator  Office: [phone]  24/7 Pager: [phone]

## 2023-01-11 NOTE — Progress Notes (Addendum)
Patient presents for INR & dressing change in VAD clinic today alone. Denies any issues with VAD or equipment. Dressing change per Dr Donata Clay.   Pt has been having pain / drainage in his driveline. Pt was started on Doxycycline last week by ID team in addition to the Keflex he already takes. Wound culture from 8/23 growing pseudomonas. Per ID team Doxycycline stopped yesterday, and started Cipro 750 mg BID x 2 weeks.   Dr Donata Clay saw pt to assess driveline condition. Will plan to perform debridement on Thursday in OR with admission to follow procedure. Will hold Coumadin tonight and tomorrow per Dr Donata Clay. Team made aware of surgical/admission plan. [name] PharmD aware of Coumadin plan.   Exit Site Care: Drive line is being maintained weekly by VAD coordinators. Existing VAD dressing removed and site care performed using sterile technique. Drive line exit site cleaned with VASHE solution and Chlora prep applicators x 2, RINSED WITH SALINE allowed to dry. 2 VASHE solution moistened 2 x 2 gauze placed at exit site, and several dry 4 x 4  gauze covering dressing reapplied. Anchor reapplied x 2. Drive line exit site unincorporated, the velour is fully implanted at exit site. Site tunnels approx 2 cm. Moderate amount of brown/green drainage with a foul odor, slight erythema, no tenderness. Pt denies fever or chills.      Patient Instructions:  Plan to report to admitting on Thursday by 10 AM with debridement to follow to at noon with Dr Donata Clay Nothing to eat or drink after MN on Wednesday night.  Hold Coumadin tonight and Wednesday night.   Alyce Pagan RN VAD Coordinator      CT surgery wound care note  Patient examined, VAD tunnel wound inspected and sterile wet-to-dry dressing with Vashe solution personally performed.  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  This  required extensive debridement and ultimately left pectoralis muscle flap coverage.  His VAD tunnel has been well-healed over the past 3 years with oral Keflex taken as prophylaxis.  The patient now presents with a new infection at the distal tunnel exit site with a new organism, Pseudomonas.  This is probably due to external contamination at the exit site and fortunately probably not a deep seeded infection.  There is only tunneling of approximately 1 cm.  There is no proximal tenderness or signs of infection.  I do not think that a abdominal CT scan is indicated.  Although the wound is not of significant size, I do feel that aggressive surgical debridement is indicated because of the presence of a gram-negative organism which represents increased risk for proximal extension.  The patient will be prepared for admission later this week for surgical debridement of the exit site tissue.  This will remove the chronic indolent infected tissue and allow better  exposure to Vashe wound wet-to-dry dressings which will be significant to clear this infection around a foreign body (the power cord).             VAD Clinic Not    Past medical history    DEAARON PAYANT is a 52 y.o. 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   Admitted 4/20 with volume overload and anemia. Diuresed with IV lasix and transitioned to torsemide 20 mg daily. GI  consulted EGD with gastritis and nonbleeding duodenal ulcers, colonoscopy with 5 polyps removed.   Admitted 12/20 for DL infection with overlying cellulitis and volume overload.    Admitted 4/21 for DL infection with large subxiphoid abscess. Underwent several debridements and eventually a pec muscle flap. Wound cx + MSSA. Initially on ancef and rifabutin but expanded to cefipime then changed to IV ancef and po rifabutin. Now on oral keflex.   Readmitted 6/21 with  superficial abscess and tachypnea. Superficial abscess I&D'd and packed. CT scan done and showed possible deeper fluid collection. Reviewed with TCTS who felt it was muscle flap. Sent to IR for possible drainage but u/s confirmed it was the muscle flap and no drainable fluid collection. Wound cx negative. ID saw and recommended continuing Keflex.    Admitted 8/21 with lactic acidosis due to cardiogenic shock. Started on 0.25 of milrinone and NE. Ramp echo on 8/13 with severe RV dysfunction. Speed increased 5600-> 6300. Tolerated ok. Repeat ramp echo on 8/20 speed increased to 6500. Diuresed over 40 pounds.    Admitted 10/21 with  ETOH pancreatitis. Gallbladder ok. Resolved with medical management     VAD Indication: Destination Therapy    VAD interrogation & Equipment Management: Speed: 6500 Flow: 5.9 Power: 6.1 w    PI: 2.9 Hct: 39   Alarms: few LV Events: 20-30 daily   Primary Controller:  Replace back up battery in 31 months Back up controller: did not bring   Annual Equipment Maintenance on UBC/PM was performed 09/16/21    I reviewed the LVAD parameters from today and compared the results to the patient's prior recorded data.  LVAD interrogation was NEGATIVE for significant power changes, NEGATIVE for clinical alarms and STABLE for PI events/speed drops. No programming changes were made and pump is functioning within specified parameters.       Past Medical History:  Diagnosis Date   Asthma     CHF (congestive heart failure) (HCC)      a. 09/2016: EF 20-25% with cath showing normal cors   GERD (gastroesophageal reflux disease)     History of hiatal hernia     LVAD (left ventricular assist device) present (HCC)     OSA on CPAP 09/06/2018    Severe OSA with AHI 68/hr on CPAP at 12cm H2O             Past Surgical History:  Procedure Laterality Date   APPLICATION OF A-CELL OF CHEST/ABDOMEN N/A 08/24/2019    Procedure: Application Of A-Cell Of Chest/Abdomen;  Surgeon: Kerin Perna, MD;  Location: [facility] OR;  Service: Thoracic;  Laterality: N/A;   APPLICATION OF A-CELL OF CHEST/ABDOMEN N/A 09/05/2019    Procedure: Application Of A-Cell Of Chest/Abdomen;  Surgeon: Peggye Form, DO;  Location: [facility] OR;  Service: Plastics;  Laterality: N/A;   APPLICATION OF A-CELL OF CHEST/ABDOMEN   09/03/2019    Procedure: Application Of A-Cell Of Chest/Abdomen;  Surgeon: Kerin Perna, MD;  Location: [facility] OR;  Service: Thoracic;;   APPLICATION OF WOUND VAC N/A 08/22/2019    Procedure: Debridement, Irrigation and Packing of Abdominal Incision.;  Surgeon: Kerin Perna, MD;  Location: [facility] OR;  Service: Thoracic;  Laterality: N/A;   APPLICATION OF WOUND VAC N/A 08/24/2019    Procedure: Irrigation and Debridement with WOUND VAC APPLICATION;  Surgeon: Kerin Perna, MD;  Location: [facility] OR;  Service: Thoracic;  Laterality: N/A;   APPLICATION OF WOUND VAC N/A 08/30/2019    Procedure: APPLICATION OF WOUND  VAC;  Surgeon: Donata Clay, Theron Arista, MD;  Location: [facility] OR;  Service: Thoracic;  Laterality: N/A;   APPLICATION OF WOUND VAC N/A 09/03/2019    Procedure: WOUND VAC CHANGE;  Surgeon: Kerin Perna, MD;  Location: [facility] OR;  Service: Thoracic;  Laterality: N/A;   BIOPSY   08/23/2018    Procedure: BIOPSY;  Surgeon: Lemar Lofty., MD;  Location: [facility] ENDOSCOPY;  Service: Gastroenterology;;   COLONOSCOPY N/A 08/23/2018    Procedure: COLONOSCOPY;  Surgeon: Lemar Lofty., MD;  Location: [facility] ENDOSCOPY;  Service: Gastroenterology;  Laterality: N/A;   ENTEROSCOPY N/A 08/23/2018    Procedure: ENTEROSCOPY;  Surgeon: Meridee Score Netty Starring., MD;  Location: [facility] ENDOSCOPY;  Service: Gastroenterology;  Laterality: N/A;   HEMOSTASIS CLIP PLACEMENT   08/23/2018    Procedure: HEMOSTASIS CLIP PLACEMENT;  Surgeon: Lemar Lofty., MD;  Location: [facility] ENDOSCOPY;  Service: Gastroenterology;;   IABP INSERTION N/A 09/05/2017    Procedure: IABP INSERTION;  Surgeon: Dolores Patty, MD;  Location: [facility]  INVASIVE CV LAB;  Service: Cardiovascular;  Laterality: N/A;   INSERTION OF IMPLANTABLE LEFT VENTRICULAR ASSIST DEVICE N/A 09/06/2017    Procedure: INSERTION OF IMPLANTABLE LEFT VENTRICULAR ASSIST DEVICE - HM3;  Surgeon: Kerin Perna, MD;  Location: [facility] OR;  Service: Open Heart Surgery;  Laterality: N/A;  HM3   MUSCLE FLAP CLOSURE N/A 09/05/2019    Procedure: MUSCLE FLAP CLOSURE;  Surgeon: Peggye Form, DO;  Location: [facility] OR;  Service: Plastics;  Laterality: N/A;   NASAL FRACTURE SURGERY   1987   POLYPECTOMY   08/23/2018    Procedure: POLYPECTOMY;  Surgeon: Mansouraty, Netty Starring., MD;  Location: [facility] ENDOSCOPY;  Service: Gastroenterology;;   RIGHT HEART CATH N/A 08/30/2017    Procedure: RIGHT HEART CATH;  Surgeon: Dolores Patty, MD;  Location: [facility] INVASIVE CV LAB;  Service: Cardiovascular;  Laterality: N/A;   RIGHT/LEFT HEART CATH AND CORONARY ANGIOGRAPHY N/A 10/08/2016    Procedure: Right/Left Heart Cath and Coronary Angiography;  Surgeon: Orpah Cobb, MD;  Location: [facility] INVASIVE CV LAB;  Service: Cardiovascular;  Laterality: N/A;   STERNAL WOUND DEBRIDEMENT N/A 08/20/2019    Procedure: DEBRIDEMENT OF LVAD DRIVELINE TUNNEL;  Surgeon: Kerin Perna, MD;  Location: [facility] OR;  Service: Thoracic;  Laterality: N/A;   STERNAL WOUND DEBRIDEMENT N/A 09/05/2019    Procedure: DEBRIDEMENT AND CLOSURE OF ABDOMINAL WOUND;  Surgeon: Peggye Form, DO;  Location: [facility] OR;  Service: Plastics;  Laterality: N/A;   SUBMUCOSAL TATTOO INJECTION   08/23/2018    Procedure: SUBMUCOSAL TATTOO INJECTION;  Surgeon: Meridee Score Netty Starring., MD;  Location: [facility] ENDOSCOPY;  Service: Gastroenterology;;   TEE WITHOUT CARDIOVERSION N/A 09/06/2017    Procedure: TRANSESOPHAGEAL ECHOCARDIOGRAM (TEE);  Surgeon: Donata Clay, Theron Arista, MD;  Location: [facility] OR;  Service: Open Heart Surgery;  Laterality: N/A;   WOUND DEBRIDEMENT N/A 08/30/2019    Procedure: Debridement Abdominal Wound;  Surgeon: Kerin Perna, MD;  Location: [facility] OR;   Service: Thoracic;  Laterality: N/A;                  Current Outpatient Medications  Medication Sig Dispense Refill   acetaminophen (TYLENOL) 500 MG tablet Take 1,500 mg by mouth 3 (three) times daily as needed for headache (pain).       albuterol (PROAIR HFA) 108 (90 Base) MCG/ACT inhaler Inhale 2 puffs into the lungs every 6 (six) hours as needed for wheezing or shortness of breath. (Patient not taking: Reported on 11/11/2021) 1 each 6  cephALEXin (KEFLEX) 500 MG capsule Take 1 capsule (500 mg total) by mouth 3 (three) times daily. 90 capsule 11   doxazosin (CARDURA) 2 MG tablet TAKE 1/2 TABLET BY MOUTH AT BEDTIME 45 tablet 1   enoxaparin (LOVENOX) 40 MG/0.4ML injection Inject 0.4 mLs (40 mg total) into the skin every 12 (twelve) hours. (Patient not taking: Reported on 01/27/2022) 4 mL 0   gabapentin (NEURONTIN) 300 MG capsule TAKE ONE CAPSULE BY MOUTH THREE TIMES DAILY AT 9am, 3pm, AND 9pm 90 capsule 5   nicotine (NICODERM CQ - DOSED IN MG/24 HOURS) 21 mg/24hr patch Place 1 patch (21 mg total) onto the skin daily. (Patient not taking: Reported on 04/29/2021) 28 patch 3   pantoprazole (PROTONIX) 40 MG tablet Take 1 tablet (40 mg total) by mouth 2 (two) times daily. 180 tablet 3   sacubitril-valsartan (ENTRESTO) 97-103 MG Take 1 tablet by mouth 2 (two) times daily. 60 tablet 6   sildenafil (REVATIO) 20 MG tablet Take 1 tablet (20 mg total) by mouth 3 (three) times daily. 90 tablet 11   sildenafil (VIAGRA) 100 MG tablet Take 1 tablet (100 mg total) by mouth daily as needed for erectile dysfunction. 10 tablet 5   spironolactone (ALDACTONE) 25 MG tablet Take 0.5 tablets (12.5 mg total) by mouth daily. 30 tablet 6   torsemide (DEMADEX) 20 MG tablet Take 1 tablet (20 mg total) by mouth every other day. (Patient not taking: Reported on 06/26/2020) 90 tablet 3   traMADol HCl 100 MG TABS Take 100 mg by mouth every 8 (eight) hours as needed. (Patient not taking: Reported on 09/07/2021) 10 tablet 0    warfarin (COUMADIN) 5 MG tablet Take 1.5 tablets (7.5 mg total) by mouth daily at 4 PM.  7.5 mg (1.5 tab) all days OR as directed by HF clinic 90 tablet 5      No current facility-administered medications for this encounter.        Allergies:   Patient has no known allergies.    Social History:  The patient  reports that he has quit smoking. His smoking use included cigarettes. He has a 12.50 pack-year smoking history. He has never used smokeless tobacco. He reports that he does not currently use alcohol. He reports that he does not currently use drugs after having used the following drugs: Marijuana.    Family History:  The patient's family history includes Alcohol abuse in his cousin; Hypertension in his father.    ROS:  Please see the history of present illness.   All other systems are personally reviewed and negative.      Vital Signs: Doppler: 128 Automatic BP: 117/80 (99) HR: 80 SPO2: 99%   Weight: 210.6 lbs w/ eqt Last wt: 204.8 lbs w/ equip      Exam: General:  NAD.  HEENT: normal  Neck: supple. JVP not elevated.  Carotids 2+ bilat; no bruits. No lymphadenopathy or thryomegaly appreciated. Cor: LVAD hum.  Lungs: Clear. Abdomen: soft, nontender, non-distended. No hepatosplenomegaly. No bruits or masses. Good bowel sounds. Driveline site clean. Anchor in place.  Extremities: no cyanosis, clubbing, rash. Warm no edema  Neuro: alert & oriented x 3. No focal deficits. Moves all 4 without problem                       ASSESSMENT AND PLAN:     1. Chronic systolic HF with prominent RV failure - EF 10% s/p HM-3 LVAD on 09/06/17 - Admitted 8/21 for  cardiogenic shock with severe RV failure. Treated with milrinone and then weaned off.  Diuresed 40 pounds. VAD speed increased 5600 -> 6300 - He continues to do very well. NYHA I - Volume status looks good - Continue Entresto and spiro.  - We have recently been discussing transplant. He is interested but needs to stop  smoking and have 6 months of negative cotinine tests. We again encouraged him to work on smoking cessation. Discussed again today   2. HM3 LVAD 09/06/2017.  - VAD interrogated personally. Parameters stable. - LDH pending - INR pending goal 2.0-2.5  Discussed dosing with PharmD personally. - Off ASA with PUD.  - hgb pending   -Driveline exit site with heavy drainage and surrounding tissue with induration chronically infected    3.  Essential HTN - Blood pressure well controlled. Continue current regimen.   4. H/o ETOH pancreatitis, 11/21 - remains quit from drinking (was stressed about his son)   5. Recurrent DL infection with subxiphoid abscess - s/p debridement and pec flap in 4/21 - remains on keflex. Site continues to look good - No fevers or chills   6.  OSA - sleep study with very severe OSA (AHI 69/hr) - He has refused therapy multiple times but seems to have improved with weight loss and ETOH cessation. We have offered repeat testing to see if OSA has improved but he refuses.  - No change   7.  H/o GI bleed  - 08/2018 EGD showed gastritis and nonbleeding duodenal ulcers, colonoscopy with 5 polyps removed.  - Remains on PPI. No recent GI bleeding - hgb pending   8. Tobacco Abuse  - continues to smoke a few cigs/day. No change - we discussed again that he needs to stop smoking to qualify for transplant.  - we will give him nicotine gum to use           Plan surgical debridement of HeartMate 3 driveline tunnel exit site August 29 at [facility] with anesthesia.  Patient understands the rationale for this procedure and agrees to proceed.  He will continue his oral Cipro antibiotic for the positive Pseudomonas wound culture and hold his Coumadin until surgery.  Lovett Sox MD

## 2023-01-11 NOTE — Addendum Note (Signed)
Encounter addended by: Bernita Raisin, RN on: 01/11/2023 12:04 PM  Actions taken: Clinical Note Signed

## 2023-01-11 NOTE — Patient Instructions (Signed)
Plan to report to admitting on Thursday by 10 AM with debridement to follow to at noon with Dr Donata Clay Nothing to eat or drink after MN on Wednesday night.  Hold Coumadin tonight and Wednesday night.

## 2023-01-12 ENCOUNTER — Encounter (HOSPITAL_COMMUNITY): Payer: Self-pay | Admitting: Cardiothoracic Surgery

## 2023-01-12 ENCOUNTER — Other Ambulatory Visit: Payer: Self-pay

## 2023-01-12 NOTE — Progress Notes (Addendum)
SDW CALL  Patient was given pre-op instructions over the phone. The opportunity was given for the patient to ask questions. No further questions asked. Patient verbalized understanding of instructions given.   PCP - none Cardiologist - Dr. Susanne Borders  PPM/ICD -  LVAD Device Orders -  Rep Notified -   Chest x-ray - DOS EKG - 03/15/20 Stress Test - 2018 ECHO - 09/16/21 Cardiac Cath - 09/05/17  Sleep Study - 05/02/18 CPAP - no  Fasting Blood Sugar - na Checks Blood Sugar _____ times a day  Blood Thinner Instructions: Pt stated he is holding Coumadin Tuesday 8/27 and Wednesday 8/28 as instructed by Dr. Zenaida Niece Trigt's office.  Aspirin Instructions:na  ERAS Protcol -no PRE-SURGERY Ensure or G2-   COVID TEST- no   Anesthesia review: yes-pt has LVAD  Patient denies shortness of breath, fever, cough and chest pain over the phone call    Surgical Instructions    Your procedure is scheduled on August 29  Report to [facility] Main Entrance "A" at 1045 A.M., then check in with the Admitting office.  Call this number if you have problems the morning of surgery:  [phone]    Remember:  Do not eat or drink anything after midnight the night before your surgery   Take these medicines the morning of surgery with A SIP OF WATER: Cipro,Gabapentin,Pantoprazole,Revatio, PRN- Tylenol  As of today, STOP taking any Aspirin (unless otherwise instructed by your surgeon) Aleve, Naproxen, Ibuprofen, Motrin, Advil, Goody's, BC's, all herbal medications, fish oil, and all vitamins.  [facility] is not responsible for any belongings or valuables. .   Do NOT Smoke (Tobacco/Vaping)  24 hours prior to your procedure  If you use a CPAP at night, you may bring your mask for your overnight stay.   Contacts, glasses, hearing aids, dentures or partials may not be worn into surgery, please bring cases for these belongings   Patients discharged the day of surgery will not be allowed to drive  home, and someone needs to stay with them for 24 hours.  Special instructions:    Oral Hygiene is also important to reduce your risk of infection.  Remember - BRUSH YOUR TEETH THE MORNING OF SURGERY WITH YOUR REGULAR TOOTHPASTE   Day of Surgery:  Take a shower the day of or night before with antibacterial soap. Wear Clean/Comfortable clothing the morning of surgery Do not apply any deodorants/lotions.   Do not wear jewelry or makeup Do not wear lotions, powders, perfumes/colognes, or deodorant. Do not shave 48 hours prior to surgery.  Men may shave face and neck. Do not bring valuables to the hospital. Do not wear nail polish, gel polish, artificial nails, or any other type of covering on natural nails (fingers and toes) If you have artificial nails or gel coating that need to be removed by a nail salon, please have this removed prior to surgery. Artificial nails or gel coating may interfere with anesthesia's ability to adequately monitor your vital signs. Remember to brush your teeth WITH YOUR REGULAR TOOTHPASTE.

## 2023-01-13 ENCOUNTER — Encounter (HOSPITAL_COMMUNITY)
Admission: RE | Disposition: A | Discharge status: Home / Self Care | Payer: Self-pay | Source: Home / Self Care | Attending: Cardiology

## 2023-01-13 ENCOUNTER — Encounter (HOSPITAL_COMMUNITY): Payer: Self-pay | Admitting: Cardiothoracic Surgery

## 2023-01-13 ENCOUNTER — Inpatient Hospital Stay (HOSPITAL_COMMUNITY)
Admission: RE | Admit: 2023-01-13 | Discharge: 2023-01-15 | DRG: 264 | Disposition: A | Discharge status: Home / Self Care | Payer: 59 | Attending: Cardiology | Admitting: Cardiology

## 2023-01-13 ENCOUNTER — Other Ambulatory Visit: Payer: Self-pay

## 2023-01-13 ENCOUNTER — Inpatient Hospital Stay (HOSPITAL_COMMUNITY): Payer: 59

## 2023-01-13 ENCOUNTER — Inpatient Hospital Stay (HOSPITAL_COMMUNITY): Payer: 59 | Admitting: Certified Registered Nurse Anesthetist

## 2023-01-13 ENCOUNTER — Inpatient Hospital Stay (HOSPITAL_BASED_OUTPATIENT_CLINIC_OR_DEPARTMENT_OTHER): Payer: 59 | Admitting: Certified Registered Nurse Anesthetist

## 2023-01-13 DIAGNOSIS — Z79899 Other long term (current) drug therapy: Secondary | ICD-10-CM

## 2023-01-13 DIAGNOSIS — T827XXA Infection and inflammatory reaction due to other cardiac and vascular devices, implants and grafts, initial encounter: Principal | ICD-10-CM | POA: Diagnosis present

## 2023-01-13 DIAGNOSIS — Z8614 Personal history of Methicillin resistant Staphylococcus aureus infection: Secondary | ICD-10-CM

## 2023-01-13 DIAGNOSIS — I5022 Chronic systolic (congestive) heart failure: Secondary | ICD-10-CM | POA: Diagnosis not present

## 2023-01-13 DIAGNOSIS — Z87891 Personal history of nicotine dependence: Secondary | ICD-10-CM

## 2023-01-13 DIAGNOSIS — Z8249 Family history of ischemic heart disease and other diseases of the circulatory system: Secondary | ICD-10-CM

## 2023-01-13 DIAGNOSIS — I110 Hypertensive heart disease with heart failure: Secondary | ICD-10-CM | POA: Diagnosis not present

## 2023-01-13 DIAGNOSIS — Z792 Long term (current) use of antibiotics: Secondary | ICD-10-CM

## 2023-01-13 DIAGNOSIS — K219 Gastro-esophageal reflux disease without esophagitis: Secondary | ICD-10-CM | POA: Diagnosis present

## 2023-01-13 DIAGNOSIS — Z8619 Personal history of other infectious and parasitic diseases: Secondary | ICD-10-CM

## 2023-01-13 DIAGNOSIS — A498 Other bacterial infections of unspecified site: Secondary | ICD-10-CM | POA: Insufficient documentation

## 2023-01-13 DIAGNOSIS — Z5986 Financial insecurity: Secondary | ICD-10-CM

## 2023-01-13 DIAGNOSIS — Z8711 Personal history of peptic ulcer disease: Secondary | ICD-10-CM

## 2023-01-13 DIAGNOSIS — I5042 Chronic combined systolic (congestive) and diastolic (congestive) heart failure: Secondary | ICD-10-CM | POA: Diagnosis not present

## 2023-01-13 DIAGNOSIS — B9561 Methicillin susceptible Staphylococcus aureus infection as the cause of diseases classified elsewhere: Secondary | ICD-10-CM | POA: Diagnosis present

## 2023-01-13 DIAGNOSIS — F17210 Nicotine dependence, cigarettes, uncomplicated: Secondary | ICD-10-CM | POA: Diagnosis present

## 2023-01-13 DIAGNOSIS — Z811 Family history of alcohol abuse and dependence: Secondary | ICD-10-CM

## 2023-01-13 DIAGNOSIS — T829XXA Unspecified complication of cardiac and vascular prosthetic device, implant and graft, initial encounter: Principal | ICD-10-CM | POA: Diagnosis present

## 2023-01-13 DIAGNOSIS — B965 Pseudomonas (aeruginosa) (mallei) (pseudomallei) as the cause of diseases classified elsewhere: Secondary | ICD-10-CM | POA: Diagnosis present

## 2023-01-13 DIAGNOSIS — Z635 Disruption of family by separation and divorce: Secondary | ICD-10-CM

## 2023-01-13 DIAGNOSIS — G4733 Obstructive sleep apnea (adult) (pediatric): Secondary | ICD-10-CM | POA: Diagnosis present

## 2023-01-13 DIAGNOSIS — Z5982 Transportation insecurity: Secondary | ICD-10-CM

## 2023-01-13 DIAGNOSIS — I428 Other cardiomyopathies: Secondary | ICD-10-CM | POA: Diagnosis present

## 2023-01-13 DIAGNOSIS — F1011 Alcohol abuse, in remission: Secondary | ICD-10-CM | POA: Diagnosis present

## 2023-01-13 DIAGNOSIS — I5082 Biventricular heart failure: Secondary | ICD-10-CM | POA: Diagnosis present

## 2023-01-13 DIAGNOSIS — J45909 Unspecified asthma, uncomplicated: Secondary | ICD-10-CM | POA: Diagnosis present

## 2023-01-13 DIAGNOSIS — Z7901 Long term (current) use of anticoagulants: Secondary | ICD-10-CM

## 2023-01-13 DIAGNOSIS — Z95811 Presence of heart assist device: Secondary | ICD-10-CM

## 2023-01-13 DIAGNOSIS — Y831 Surgical operation with implant of artificial internal device as the cause of abnormal reaction of the patient, or of later complication, without mention of misadventure at the time of the procedure: Secondary | ICD-10-CM | POA: Diagnosis present

## 2023-01-13 HISTORY — PX: STERNAL WOUND DEBRIDEMENT: SHX1058

## 2023-01-13 LAB — URINALYSIS, ROUTINE W REFLEX MICROSCOPIC
Bilirubin Urine: NEGATIVE
Glucose, UA: NEGATIVE mg/dL
Hgb urine dipstick: NEGATIVE
Ketones, ur: NEGATIVE mg/dL
Leukocytes,Ua: NEGATIVE
Nitrite: NEGATIVE
Protein, ur: NEGATIVE mg/dL
Specific Gravity, Urine: 1.025 (ref 1.005–1.030)
pH: 5 (ref 5.0–8.0)

## 2023-01-13 LAB — COMPREHENSIVE METABOLIC PANEL
ALT: 14 U/L (ref 0–44)
AST: 18 U/L (ref 15–41)
Albumin: 3.6 g/dL (ref 3.5–5.0)
Alkaline Phosphatase: 60 U/L (ref 38–126)
Anion gap: 7 (ref 5–15)
BUN: 11 mg/dL (ref 6–20)
CO2: 19 mmol/L — ABNORMAL LOW (ref 22–32)
Calcium: 8.4 mg/dL — ABNORMAL LOW (ref 8.9–10.3)
Chloride: 109 mmol/L (ref 98–111)
Creatinine, Ser: 1.11 mg/dL (ref 0.61–1.24)
GFR, Estimated: >60 mL/min (ref >60)
Glucose, Bld: 96 mg/dL (ref 70–99)
Potassium: 3.9 mmol/L (ref 3.5–5.1)
Sodium: 135 mmol/L (ref 135–145)
Total Bilirubin: 1.2 mg/dL (ref 0.3–1.2)
Total Protein: 6.7 g/dL (ref 6.5–8.1)

## 2023-01-13 LAB — CBC
HCT: 31.5 % — ABNORMAL LOW (ref 39.0–52.0)
Hemoglobin: 10 g/dL — ABNORMAL LOW (ref 13.0–17.0)
MCH: 27.7 pg (ref 26.0–34.0)
MCHC: 31.7 g/dL (ref 30.0–36.0)
MCV: 87.3 fL (ref 80.0–100.0)
Platelets: 171 10*3/uL (ref 150–400)
RBC: 3.61 MIL/uL — ABNORMAL LOW (ref 4.22–5.81)
RDW: 13.1 % (ref 11.5–15.5)
WBC: 3.7 10*3/uL — ABNORMAL LOW (ref 4.0–10.5)
nRBC: 0 % (ref 0.0–0.2)

## 2023-01-13 LAB — SURGICAL PCR SCREEN
MRSA, PCR: NEGATIVE
Staphylococcus aureus: NEGATIVE

## 2023-01-13 LAB — PROTIME-INR
INR: 1.8 — ABNORMAL HIGH (ref 0.8–1.2)
INR: 1.9 — ABNORMAL HIGH (ref 0.8–1.2)
Prothrombin Time: 21.2 seconds — ABNORMAL HIGH (ref 11.4–15.2)
Prothrombin Time: 21.8 seconds — ABNORMAL HIGH (ref 11.4–15.2)

## 2023-01-13 LAB — LACTATE DEHYDROGENASE: LDH: 181 U/L (ref 98–192)

## 2023-01-13 LAB — HIV ANTIBODY (ROUTINE TESTING W REFLEX): HIV Screen 4th Generation wRfx: NONREACTIVE

## 2023-01-13 LAB — APTT: aPTT: 38 seconds — ABNORMAL HIGH (ref 24–36)

## 2023-01-13 SURGERY — DEBRIDEMENT, WOUND, STERNUM
Anesthesia: Monitor Anesthesia Care

## 2023-01-13 MED ORDER — GABAPENTIN 300 MG PO CAPS
900.0000 mg | ORAL_CAPSULE | Freq: Three times a day (TID) | ORAL | Status: DC
  Administered 2023-01-13 – 2023-01-15 (×6): 900 mg via ORAL
  Filled 2023-01-13 (×6): qty 3

## 2023-01-13 MED ORDER — PROPOFOL 500 MG/50ML IV EMUL
INTRAVENOUS | Status: DC | PRN
  Administered 2023-01-13: 100 ug/kg/min via INTRAVENOUS

## 2023-01-13 MED ORDER — MIDAZOLAM HCL 2 MG/2ML IJ SOLN
INTRAMUSCULAR | Status: AC
  Filled 2023-01-13: qty 2

## 2023-01-13 MED ORDER — OXYCODONE HCL 5 MG/5ML PO SOLN: 5.0000 mg | Freq: Once | ORAL | Status: DC | PRN

## 2023-01-13 MED ORDER — FENTANYL CITRATE (PF) 250 MCG/5ML IJ SOLN
INTRAMUSCULAR | Status: DC | PRN
  Administered 2023-01-13: 50 ug via INTRAVENOUS

## 2023-01-13 MED ORDER — SACUBITRIL-VALSARTAN 97-103 MG PO TABS
1.0000 | ORAL_TABLET | Freq: Two times a day (BID) | ORAL | Status: DC
  Administered 2023-01-13 – 2023-01-15 (×4): 1 via ORAL
  Filled 2023-01-13 (×5): qty 1

## 2023-01-13 MED ORDER — SPIRONOLACTONE 12.5 MG HALF TABLET
12.5000 mg | ORAL_TABLET | Freq: Every day | ORAL | Status: DC
  Administered 2023-01-13 – 2023-01-15 (×3): 12.5 mg via ORAL
  Filled 2023-01-13 (×3): qty 1

## 2023-01-13 MED ORDER — DOXAZOSIN MESYLATE 1 MG PO TABS
1.0000 mg | ORAL_TABLET | Freq: Every day | ORAL | Status: DC
  Filled 2023-01-13: qty 1

## 2023-01-13 MED ORDER — SODIUM CHLORIDE 0.9% FLUSH: 3.0000 mL | INTRAVENOUS | Status: DC | PRN

## 2023-01-13 MED ORDER — FENTANYL CITRATE (PF) 100 MCG/2ML IJ SOLN
25.0000 ug | INTRAMUSCULAR | Status: DC | PRN
  Administered 2023-01-13: 50 ug via INTRAVENOUS

## 2023-01-13 MED ORDER — SODIUM CHLORIDE 0.9 % IV SOLN: 250.0000 mL | INTRAVENOUS | Status: DC | PRN

## 2023-01-13 MED ORDER — WARFARIN - PHYSICIAN DOSING INPATIENT: Freq: Every day | Status: DC

## 2023-01-13 MED ORDER — CEFAZOLIN SODIUM-DEXTROSE 2-4 GM/100ML-% IV SOLN
2.0000 g | INTRAVENOUS | Status: AC
  Administered 2023-01-13: 2 g via INTRAVENOUS
  Filled 2023-01-13: qty 100

## 2023-01-13 MED ORDER — PANTOPRAZOLE SODIUM 40 MG PO TBEC
40.0000 mg | DELAYED_RELEASE_TABLET | Freq: Two times a day (BID) | ORAL | Status: DC
  Administered 2023-01-13 – 2023-01-15 (×4): 40 mg via ORAL
  Filled 2023-01-13 (×4): qty 1

## 2023-01-13 MED ORDER — MORPHINE SULFATE (PF) 2 MG/ML IV SOLN
4.0000 mg | INTRAVENOUS | Status: AC | PRN
  Administered 2023-01-13 – 2023-01-14 (×4): 4 mg via INTRAVENOUS
  Filled 2023-01-13 (×4): qty 2

## 2023-01-13 MED ORDER — MIDAZOLAM HCL 2 MG/2ML IJ SOLN
INTRAMUSCULAR | Status: DC | PRN
  Administered 2023-01-13: 2 mg via INTRAVENOUS

## 2023-01-13 MED ORDER — ONDANSETRON HCL 4 MG/2ML IJ SOLN: 4.0000 mg | Freq: Once | INTRAMUSCULAR | Status: DC | PRN

## 2023-01-13 MED ORDER — LIDOCAINE HCL (PF) 1 % IJ SOLN
INTRAMUSCULAR | Status: AC
  Filled 2023-01-13: qty 30

## 2023-01-13 MED ORDER — SODIUM CHLORIDE 0.9 % IV SOLN
2.0000 g | Freq: Three times a day (TID) | INTRAVENOUS | Status: DC
  Administered 2023-01-13 – 2023-01-15 (×5): 2 g via INTRAVENOUS
  Filled 2023-01-13 (×7): qty 12.5

## 2023-01-13 MED ORDER — OXYCODONE HCL 5 MG PO TABS
5.0000 mg | ORAL_TABLET | ORAL | Status: DC | PRN
  Administered 2023-01-14 – 2023-01-15 (×4): 5 mg via ORAL
  Filled 2023-01-13 (×4): qty 1

## 2023-01-13 MED ORDER — ACETAMINOPHEN 500 MG PO TABS: 1000.0000 mg | ORAL_TABLET | Freq: Three times a day (TID) | ORAL | Status: DC | PRN

## 2023-01-13 MED ORDER — SODIUM CHLORIDE 0.9% FLUSH
3.0000 mL | Freq: Two times a day (BID) | INTRAVENOUS | Status: DC
  Administered 2023-01-14: 3 mL via INTRAVENOUS

## 2023-01-13 MED ORDER — DEXTROSE IN LACTATED RINGERS 5 % IV SOLN: INTRAVENOUS | Status: DC

## 2023-01-13 MED ORDER — SODIUM CHLORIDE 0.9 % IR SOLN
Status: DC | PRN
  Administered 2023-01-13: 1000 mL

## 2023-01-13 MED ORDER — ONDANSETRON HCL 4 MG/2ML IJ SOLN
4.0000 mg | Freq: Four times a day (QID) | INTRAMUSCULAR | Status: DC | PRN
  Administered 2023-01-15: 4 mg via INTRAVENOUS
  Filled 2023-01-13: qty 2

## 2023-01-13 MED ORDER — OXYCODONE HCL 5 MG PO TABS: 5.0000 mg | ORAL_TABLET | Freq: Once | ORAL | Status: DC | PRN

## 2023-01-13 MED ORDER — ACETAMINOPHEN 10 MG/ML IV SOLN: 1000.0000 mg | Freq: Once | INTRAVENOUS | Status: DC | PRN

## 2023-01-13 MED ORDER — LIDOCAINE HCL (PF) 1 % IJ SOLN
INTRAMUSCULAR | Status: DC | PRN
Start: 2023-01-13 — End: 2023-01-13
  Administered 2023-01-13: 30 mL

## 2023-01-13 MED ORDER — ACETAMINOPHEN 325 MG PO TABS: 650.0000 mg | ORAL_TABLET | ORAL | Status: DC | PRN

## 2023-01-13 MED ORDER — ALBUTEROL SULFATE (2.5 MG/3ML) 0.083% IN NEBU: 2.5000 mg | INHALATION_SOLUTION | Freq: Four times a day (QID) | RESPIRATORY_TRACT | Status: DC | PRN

## 2023-01-13 MED ORDER — FENTANYL CITRATE (PF) 250 MCG/5ML IJ SOLN
INTRAMUSCULAR | Status: AC
  Filled 2023-01-13: qty 5

## 2023-01-13 MED ORDER — LACTATED RINGERS IV SOLN: INTRAVENOUS | Status: DC

## 2023-01-13 MED ORDER — WARFARIN SODIUM 5 MG PO TABS
5.0000 mg | ORAL_TABLET | Freq: Every day | ORAL | Status: DC
  Administered 2023-01-13 – 2023-01-14 (×2): 5 mg via ORAL
  Filled 2023-01-13 (×2): qty 1

## 2023-01-13 MED ORDER — CHLORHEXIDINE GLUCONATE 0.12 % MT SOLN
15.0000 mL | OROMUCOSAL | Status: AC
  Administered 2023-01-13: 15 mL via OROMUCOSAL
  Filled 2023-01-13: qty 15

## 2023-01-13 MED ORDER — PROPOFOL 10 MG/ML IV BOLUS
INTRAVENOUS | Status: AC
  Filled 2023-01-13: qty 20

## 2023-01-13 MED ORDER — FENTANYL CITRATE (PF) 100 MCG/2ML IJ SOLN
INTRAMUSCULAR | Status: AC
  Filled 2023-01-13: qty 2

## 2023-01-13 MED ORDER — SILDENAFIL CITRATE 20 MG PO TABS
20.0000 mg | ORAL_TABLET | Freq: Three times a day (TID) | ORAL | Status: DC
  Administered 2023-01-13 – 2023-01-15 (×6): 20 mg via ORAL
  Filled 2023-01-13 (×6): qty 1

## 2023-01-13 MED ORDER — PROPOFOL 10 MG/ML IV BOLUS
INTRAVENOUS | Status: DC | PRN
  Administered 2023-01-13: 30 mg via INTRAVENOUS
  Administered 2023-01-13: 25 mg via INTRAVENOUS

## 2023-01-13 SURGICAL SUPPLY — 36 items
APL SKNCLS STERI-STRIP NONHPOA (GAUZE/BANDAGES/DRESSINGS) ×2
BAG DECANTER FOR FLEXI CONT (MISCELLANEOUS) ×1 IMPLANT
BENZOIN TINCTURE PRP APPL 2/3 (GAUZE/BANDAGES/DRESSINGS) IMPLANT
BLADE CLIPPER SURG (BLADE) ×1 IMPLANT
BLADE SURG 10 STRL SS (BLADE) ×1 IMPLANT
CANISTER SUCT 3000ML PPV (MISCELLANEOUS) ×1 IMPLANT
CNTNR URN SCR LID CUP LEK RST (MISCELLANEOUS) IMPLANT
CONT SPEC 4OZ STRL OR WHT (MISCELLANEOUS) ×1
DRAPE LAPAROSCOPIC ABDOMINAL (DRAPES) ×1 IMPLANT
DRSG TEGADERM 4X10 (GAUZE/BANDAGES/DRESSINGS) IMPLANT
DRSG TEGADERM 4X4.75 (GAUZE/BANDAGES/DRESSINGS) IMPLANT
ELECT BLADE 4.0 EZ CLEAN MEGAD (MISCELLANEOUS) ×1
ELECT REM PT RETURN 9FT ADLT (ELECTROSURGICAL) ×1
ELECTRODE BLDE 4.0 EZ CLN MEGD (MISCELLANEOUS) IMPLANT
ELECTRODE REM PT RTRN 9FT ADLT (ELECTROSURGICAL) ×1 IMPLANT
GAUZE PAD ABD 8X10 STRL (GAUZE/BANDAGES/DRESSINGS) IMPLANT
GAUZE SPONGE 4X4 12PLY STRL (GAUZE/BANDAGES/DRESSINGS) ×1 IMPLANT
GLOVE BIO SURGEON STRL SZ7.5 (GLOVE) ×2 IMPLANT
GOWN STRL REUS W/ TWL LRG LVL3 (GOWN DISPOSABLE) ×8 IMPLANT
GOWN STRL REUS W/TWL LRG LVL3 (GOWN DISPOSABLE) ×4
HANDPIECE INTERPULSE COAX TIP (DISPOSABLE) ×1
KIT BASIN OR (CUSTOM PROCEDURE TRAY) ×1 IMPLANT
KIT TURNOVER KIT B (KITS) ×2 IMPLANT
NDL HYPO 25GX1X1/2 BEV (NEEDLE) IMPLANT
NEEDLE HYPO 25GX1X1/2 BEV (NEEDLE) ×1 IMPLANT
NS IRRIG 1000ML POUR BTL (IV SOLUTION) ×2 IMPLANT
PACK CHEST (CUSTOM PROCEDURE TRAY) ×2 IMPLANT
PAD ARMBOARD 7.5X6 YLW CONV (MISCELLANEOUS) ×2 IMPLANT
SET HNDPC FAN SPRY TIP SCT (DISPOSABLE) IMPLANT
SOL PREP POV-IOD 4OZ 10% (MISCELLANEOUS) IMPLANT
SWAB COLLECTION DEVICE MRSA (MISCELLANEOUS) IMPLANT
SYR 10ML LL (SYRINGE) IMPLANT
TOWEL GREEN STERILE (TOWEL DISPOSABLE) ×2 IMPLANT
TOWEL GREEN STERILE FF (TOWEL DISPOSABLE) ×2 IMPLANT
TUBE CONNECTING 12X1/4 (SUCTIONS) IMPLANT
WATER STERILE IRR 1000ML POUR (IV SOLUTION) ×2 IMPLANT

## 2023-01-13 NOTE — Progress Notes (Signed)
Pre Procedure note for inpatients:   Jonathan Wood has been scheduled for Procedure(s): VAD TUNNEL WOUND DEBRIDEMENT (N/A) today. The various methods of treatment have been discussed with the patient. After consideration of the risks, benefits and treatment options the patient has consented to the planned procedure.   The patient has been seen and labs reviewed. There are no changes in the patient's condition to prevent proceeding with the planned procedure today.  Recent labs:  Lab Results  Component Value Date   WBC 3.7 (L) 01/13/2023   HGB 10.0 (L) 01/13/2023   HCT 31.5 (L) 01/13/2023   PLT 171 01/13/2023   GLUCOSE 96 01/13/2023   CHOL 168 12/19/2020   TRIG 88 08/11/2022   HDL 48 12/19/2020   LDLCALC 106 (H) 12/19/2020   ALT 14 01/13/2023   AST 18 01/13/2023   NA 135 01/13/2023   K 3.9 01/13/2023   CL 109 01/13/2023   CREATININE 1.11 01/13/2023   BUN 11 01/13/2023   CO2 19 (L) 01/13/2023   TSH 0.209 (L) 08/13/2019   INR 1.8 (H) 01/13/2023   HGBA1C 5.4 12/28/2019    Lovett Sox, MD 01/13/2023 1:36 PM

## 2023-01-13 NOTE — Anesthesia Preprocedure Evaluation (Signed)
Anesthesia Evaluation  Patient identified by MRN, date of birth, ID band Patient awake    Reviewed: Allergy & Precautions, NPO status , Patient's Chart, lab work & pertinent test results, reviewed documented beta blocker date and time   History of Anesthesia Complications Negative for: history of anesthetic complications  Airway Mallampati: II  TM Distance: >3 FB Neck ROM: Full    Dental  (+) Partial Upper   Pulmonary neg shortness of breath, asthma , sleep apnea , COPD, neg recent URI, former smoker, neg PE   breath sounds clear to auscultation       Cardiovascular hypertension, +CHF  (-) Orthopnea (-) dysrhythmias  Rhythm:Regular Rate:Normal  S/p LVAD   Neuro/Psych neg Seizures PSYCHIATRIC DISORDERS Anxiety        GI/Hepatic hiatal hernia,GERD  ,,(+) neg Cirrhosis        Endo/Other  neg diabetes    Renal/GU Renal disease     Musculoskeletal   Abdominal   Peds  Hematology  (+) Blood dyscrasia, anemia   Anesthesia Other Findings   Reproductive/Obstetrics                              Anesthesia Physical Anesthesia Plan  ASA: 4  Anesthesia Plan: MAC   Post-op Pain Management:    Induction: Intravenous  PONV Risk Score and Plan: 1 and Ondansetron  Airway Management Planned:   Additional Equipment:   Intra-op Plan:   Post-operative Plan:   Informed Consent: I have reviewed the patients History and Physical, chart, labs and discussed the procedure including the risks, benefits and alternatives for the proposed anesthesia with the patient or authorized representative who has indicated his/her understanding and acceptance.     Dental advisory given  Plan Discussed with: CRNA  Anesthesia Plan Comments: (Pulsatile - NIBP appears to be reading, although pulse oximetry is inconsistent in preop. Will utilize cerebral OX if OR pulse ox unable. )         Anesthesia Quick  Evaluation

## 2023-01-13 NOTE — Progress Notes (Signed)
ID brief note   Patient in OR this afternoon when attempted to see. 18 y/0 male with prior h/o MSSA LVAD infection admitted for new LVAD infection. Plan to start cefepime post operatively Formal consult to follow tomorrow  Odette Fraction, MD Infectious Disease Physician [facility] [address] [place], [place] [address] Phone: [phone]  Fax: [phone]

## 2023-01-13 NOTE — Transfer of Care (Signed)
Immediate Anesthesia Transfer of Care Note  Patient: Jonathan Wood  Procedure(s) Performed: VAD TUNNEL WOUND DEBRIDEMENT  Patient Location: PACU  Anesthesia Type:MAC  Level of Consciousness: awake, alert , oriented, patient cooperative, and responds to stimulation  Airway & Oxygen Therapy: Patient Spontanous Breathing  Post-op Assessment: Report given to RN and Post -op Vital signs reviewed and stable  Post vital signs: Reviewed and stable  Last Vitals:  Vitals Value Taken Time  BP 108/69 01/13/23 1448  Temp    Pulse 68   Resp 15 01/13/23 1452  SpO2    Vitals shown include unfiled device data.  Last Pain:  Vitals:   01/13/23 1448  TempSrc:   PainSc: 0-No pain      Patients Stated Pain Goal: 0 (01/13/23 1113)  Complications: No notable events documented.

## 2023-01-13 NOTE — Progress Notes (Signed)
ANTICOAGULATION CONSULT NOTE  Pharmacy Consult for warfarin Indication:  LVAD  No Known Allergies  Patient Measurements: Height: 6\' 2"  (188 cm) Weight: 95.7 kg (211 lb) IBW/kg (Calculated) : 82.2  Vital Signs: Temp: 98.1 F (36.7 C) (08/29 1048) Temp Source: Oral (08/29 1048) BP: 108/69 (08/29 1448) Pulse Rate: 82 (08/29 1048)  Labs: Recent Labs    01/11/23 0912 01/13/23 1054  HGB  --  10.0*  HCT  --  31.5*  PLT  --  171  APTT  --  38*  LABPROT 26.4* 21.2*  INR 2.4* 1.8*  CREATININE  --  1.11    Estimated Creatinine Clearance: 93.6 mL/min (by C-G formula based on SCr of 1.11 mg/dL).   Medical History: Past Medical History:  Diagnosis Date   Asthma    CHF (congestive heart failure) (HCC)    a. 09/2016: EF 20-25% with cath showing normal cors   GERD (gastroesophageal reflux disease)    History of hiatal hernia    LVAD (left ventricular assist device) present (HCC)    OSA on CPAP 09/06/2018   Severe OSA with AHI 68/hr on CPAP at 12cm H2O    Assessment: 8 yoM with LVAD admitted with DLI. Warfarin held preop, INR 1.8 today.   Home warfarin dose is 10mg  Mon/Fri, 7.5mg  AODs  Goal of Therapy:  INR 2-2.5 Monitor platelets by anticoagulation protocol: Yes   Plan:  Warfarin 5mg  x1 tonight per Dr. Donata Clay Daily INR  Fredonia Highland, PharmD, BCPS, [facility] Clinical Pharmacist [phone] Please check AMION for all [facility] Pharmacy numbers 01/13/2023

## 2023-01-13 NOTE — H&P (Addendum)
Advanced Heart Failure VAD History and Physical Note   PCP-Cardiologist: Arvilla Meres, MD   Reason for Admission: LVAD Complication Driveline Infection   HPI:    Jonathan Wood is a 52 year old with a history of HFrEF, NICM, ETOH abuse, HTN, smoker, anemia, chronic anticoagulation, and HMIII LVAD implanted 2019. 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   He has been on chronic suppressive antibiotics for MRSA infection.   Recent wound cultured showed pseudomonas. ID stopped doxycycline and switched to cipro 750 mg twice a day. He saw Dr Maren Beach in the VAD clinic and he recommended surgical debridement. Cpumadin held.    Admitted observation status after driveline debridement. Deep cultures obtained. Starting on cefepime.   INR 1.8   Denies pain.   LVAD INTERROGATION:  HeartMate III LVAD:  Flow 6 liters/min, speed 5600, power 5.7, PI 2.5 .     Review of Systems: [y] = yes, [ ]  = no   General: Weight gain [ ] ; Weight loss [ ] ; Anorexia [ ] ; Fatigue [ ] ; Fever [ ] ; Chills [ ] ; Weakness [ ]   Cardiac: Chest pain/pressure [ ] ; Resting SOB [ ] ; Exertional SOB [ ] ; Orthopnea [ ] ; Pedal Edema [ ] ; Palpitations [ ] ; Syncope [ ] ; Presyncope [ ] ; Paroxysmal nocturnal dyspnea[ ]   Pulmonary: Cough [ ] ; Wheezing[ ] ; Hemoptysis[ ] ; Sputum [ ] ; Snoring [ ]   GI: Vomiting[ ] ; Dysphagia[ ] ; Melena[ ] ; Hematochezia [ ] ; Heartburn[ ] ; Abdominal pain [ ] ; Constipation [ ] ; Diarrhea [ ] ; BRBPR [ ]   GU: Hematuria[ ] ; Dysuria [ ] ; Nocturia[ ]   Vascular: Pain in legs with walking [ ] ; Pain in feet with lying flat [ ] ; Non-healing sores [ ] ; Stroke [ ] ; TIA [ ] ; Slurred speech [ ] ;  Neuro: Headaches[ ] ; Vertigo[ ] ; Seizures[ ] ; Paresthesias[ ] ;Blurred vision [ ] ; Diplopia [ ] ; Vision changes [ ]   Ortho/Skin: Arthritis [ ] ; Joint pain [Y ]; Muscle pain [ ] ; Joint swelling [ ] ; Back Pain [ Y]; Rash [ ]   Psych: Depression[ Y]; Anxiety[ ]   Heme:  Bleeding problems [ ] ; Clotting disorders [ ] ; Anemia [ ]   Endocrine: Diabetes [ ] ; Thyroid dysfunction[ ]     Home Medications Prior to Admission medications   Medication Sig Start Date End Date Taking? Authorizing Provider  acetaminophen (TYLENOL) 500 MG tablet Take 1,500 mg by mouth 3 (three) times daily as needed for headache (pain).   Yes [provider]  cephALEXin (KEFLEX) 500 MG capsule Take 1 capsule (500 mg total) by mouth 3 (three) times daily. 09/27/22  Yes Bensimhon, Bevelyn Buckles, MD  ciprofloxacin (CIPRO) 750 MG tablet Take 1 tablet (750 mg total) by mouth 2 (two) times daily. 01/10/23  Yes Blanchard Kelch, NP  doxazosin (CARDURA) 2 MG tablet Take 0.5 tablets (1 mg total) by mouth at bedtime. 01/07/23  Yes Bensimhon, Bevelyn Buckles, MD  ENTRESTO 97-103 MG TAKE 1 TABLET BY MOUTH TWICE DAILY 07/19/22  Yes Bensimhon, Bevelyn Buckles, MD  gabapentin (NEURONTIN) 300 MG capsule TAKE ONE CAPSULE BY MOUTH THREE TIMES DAILY AT 9am, 3pm, AND 9pm 02/01/22  Yes Bensimhon, Bevelyn Buckles, MD  pantoprazole (PROTONIX) 40 MG tablet Take 1 tablet (40 mg total) by mouth 2 (two) times daily. 07/22/22  Yes Bensimhon, Bevelyn Buckles, MD  sildenafil (REVATIO) 20 MG tablet Take 1 tablet (20 mg total) by mouth 3 (three) times daily. 11/10/22  Yes Bensimhon, Bevelyn Buckles, MD  spironolactone (ALDACTONE)  25 MG tablet Take 0.5 tablets (12.5 mg total) by mouth daily. 11/10/22  Yes Bensimhon, Bevelyn Buckles, MD  albuterol (PROAIR HFA) 108 (90 Base) MCG/ACT inhaler Inhale 2 puffs into the lungs every 6 (six) hours as needed for wheezing or shortness of breath. Patient not taking: Reported on 11/11/2021 06/16/20   Bensimhon, Bevelyn Buckles, MD  enoxaparin (LOVENOX) 40 MG/0.4ML injection Inject 0.4 mLs (40 mg total) into the skin every 12 (twelve) hours. Patient not taking: Reported on 01/27/2022 11/11/21   Laurey Morale, MD  sildenafil (VIAGRA) 100 MG tablet Take 1 tablet (100 mg total) by mouth daily as needed for erectile dysfunction. 07/22/22   Bensimhon,  Bevelyn Buckles, MD  torsemide (DEMADEX) 20 MG tablet Take 1 tablet (20 mg total) by mouth every other day. Patient not taking: Reported on 06/26/2020 03/21/20   Tonye Becket D, NP  traMADol HCl 100 MG TABS Take 100 mg by mouth every 8 (eight) hours as needed. Patient not taking: Reported on 09/07/2021 08/28/21   Bensimhon, Bevelyn Buckles, MD  warfarin (COUMADIN) 5 MG tablet Take 1.5 tablets (7.5 mg total) by mouth daily at 4 PM.  7.5 mg (1.5 tab) all days OR as directed by HF clinic 06/25/22   Bensimhon, Bevelyn Buckles, MD    Past Medical History: Past Medical History:  Diagnosis Date   Asthma    CHF (congestive heart failure) (HCC)    a. 09/2016: EF 20-25% with cath showing normal cors   GERD (gastroesophageal reflux disease)    History of hiatal hernia    LVAD (left ventricular assist device) present (HCC)    OSA on CPAP 09/06/2018   Severe OSA with AHI 68/hr on CPAP at 12cm H2O    Past Surgical History: Past Surgical History:  Procedure Laterality Date   APPLICATION OF A-CELL OF CHEST/ABDOMEN N/A 08/24/2019   Procedure: Application Of A-Cell Of Chest/Abdomen;  Surgeon: Kerin Perna, MD;  Location: [facility] OR;  Service: Thoracic;  Laterality: N/A;   APPLICATION OF A-CELL OF CHEST/ABDOMEN N/A 09/05/2019   Procedure: Application Of A-Cell Of Chest/Abdomen;  Surgeon: Peggye Form, DO;  Location: [facility] OR;  Service: Plastics;  Laterality: N/A;   APPLICATION OF A-CELL OF CHEST/ABDOMEN  09/03/2019   Procedure: Application Of A-Cell Of Chest/Abdomen;  Surgeon: Kerin Perna, MD;  Location: [facility] OR;  Service: Thoracic;;   APPLICATION OF WOUND VAC N/A 08/22/2019   Procedure: Debridement, Irrigation and Packing of Abdominal Incision.;  Surgeon: Kerin Perna, MD;  Location: [facility] OR;  Service: Thoracic;  Laterality: N/A;   APPLICATION OF WOUND VAC N/A 08/24/2019   Procedure: Irrigation and Debridement with WOUND VAC APPLICATION;  Surgeon: Kerin Perna, MD;  Location: [facility] OR;  Service: Thoracic;  Laterality: N/A;    APPLICATION OF WOUND VAC N/A 08/30/2019   Procedure: APPLICATION OF WOUND VAC;  Surgeon: Kerin Perna, MD;  Location: [facility] OR;  Service: Thoracic;  Laterality: N/A;   APPLICATION OF WOUND VAC N/A 09/03/2019   Procedure: WOUND VAC CHANGE;  Surgeon: Kerin Perna, MD;  Location: [facility] OR;  Service: Thoracic;  Laterality: N/A;   BIOPSY  08/23/2018   Procedure: BIOPSY;  Surgeon: Lemar Lofty., MD;  Location: [facility] ENDOSCOPY;  Service: Gastroenterology;;   COLONOSCOPY N/A 08/23/2018   Procedure: COLONOSCOPY;  Surgeon: Lemar Lofty., MD;  Location: [facility] ENDOSCOPY;  Service: Gastroenterology;  Laterality: N/A;   ENTEROSCOPY N/A 08/23/2018   Procedure: ENTEROSCOPY;  Surgeon: Meridee Score Netty Starring., MD;  Location: [facility] ENDOSCOPY;  Service: Gastroenterology;  Laterality: N/A;   HEMOSTASIS CLIP PLACEMENT  08/23/2018   Procedure: HEMOSTASIS CLIP PLACEMENT;  Surgeon: Lemar Lofty., MD;  Location: [facility] ENDOSCOPY;  Service: Gastroenterology;;   IABP INSERTION N/A 09/05/2017   Procedure: IABP INSERTION;  Surgeon: Dolores Patty, MD;  Location: [facility] INVASIVE CV LAB;  Service: Cardiovascular;  Laterality: N/A;   INSERTION OF IMPLANTABLE LEFT VENTRICULAR ASSIST DEVICE N/A 09/06/2017   Procedure: INSERTION OF IMPLANTABLE LEFT VENTRICULAR ASSIST DEVICE - HM3;  Surgeon: Kerin Perna, MD;  Location: [facility] OR;  Service: Open Heart Surgery;  Laterality: N/A;  HM3   MUSCLE FLAP CLOSURE N/A 09/05/2019   Procedure: MUSCLE FLAP CLOSURE;  Surgeon: Peggye Form, DO;  Location: [facility] OR;  Service: Plastics;  Laterality: N/A;   NASAL FRACTURE SURGERY  1987   POLYPECTOMY  08/23/2018   Procedure: POLYPECTOMY;  Surgeon: Mansouraty, Netty Starring., MD;  Location: [facility] ENDOSCOPY;  Service: Gastroenterology;;   RIGHT HEART CATH N/A 08/30/2017   Procedure: RIGHT HEART CATH;  Surgeon: Dolores Patty, MD;  Location: [facility] INVASIVE CV LAB;  Service: Cardiovascular;  Laterality: N/A;   RIGHT/LEFT HEART CATH AND CORONARY  ANGIOGRAPHY N/A 10/08/2016   Procedure: Right/Left Heart Cath and Coronary Angiography;  Surgeon: Orpah Cobb, MD;  Location: [facility] INVASIVE CV LAB;  Service: Cardiovascular;  Laterality: N/A;   STERNAL WOUND DEBRIDEMENT N/A 08/20/2019   Procedure: DEBRIDEMENT OF LVAD DRIVELINE TUNNEL;  Surgeon: Kerin Perna, MD;  Location: [facility] OR;  Service: Thoracic;  Laterality: N/A;   STERNAL WOUND DEBRIDEMENT N/A 09/05/2019   Procedure: DEBRIDEMENT AND CLOSURE OF ABDOMINAL WOUND;  Surgeon: Peggye Form, DO;  Location: [facility] OR;  Service: Plastics;  Laterality: N/A;   SUBMUCOSAL TATTOO INJECTION  08/23/2018   Procedure: SUBMUCOSAL TATTOO INJECTION;  Surgeon: Meridee Score Netty Starring., MD;  Location: [facility] ENDOSCOPY;  Service: Gastroenterology;;   TEE WITHOUT CARDIOVERSION N/A 09/06/2017   Procedure: TRANSESOPHAGEAL ECHOCARDIOGRAM (TEE);  Surgeon: Donata Clay, Theron Arista, MD;  Location: [facility] OR;  Service: Open Heart Surgery;  Laterality: N/A;   WOUND DEBRIDEMENT N/A 08/30/2019   Procedure: Debridement Abdominal Wound;  Surgeon: Kerin Perna, MD;  Location: [facility] OR;  Service: Thoracic;  Laterality: N/A;    Family History: Family History  Problem Relation Age of Onset   Hypertension Father    Alcohol abuse Cousin    Colon cancer Neg Hx    Esophageal cancer Neg Hx    Inflammatory bowel disease Neg Hx    Liver disease Neg Hx    Pancreatic cancer Neg Hx    Rectal cancer Neg Hx    Stomach cancer Neg Hx    Diabetes Neg Hx     Social History: Social History   Socioeconomic History   Marital status: Legally Separated    Spouse name: Not on file   Number of children: 3   Years of education: Not on file   Highest education level: Not on file  Occupational History   Not on file  Tobacco Use   Smoking status: Former    Current packs/day: 0.50    Average packs/day: 0.5 packs/day for 25.0 years (12.5 ttl pk-yrs)    Types: Cigarettes   Smokeless tobacco: Never   Tobacco comments:    quit 08-16-2019  Vaping Use    Vaping status: Some Days  Substance and Sexual Activity   Alcohol use: Not Currently    Comment: stopped drinking in April 2021   Drug use: Not Currently    Types: Marijuana  Sexual activity: Yes  Other Topics Concern   Not on file  Social History Narrative   Lives at home with his three children (8, 84, and 1 years of age).    Just approved for SS disability.   Social Determinants of Health   Financial Resource Strain: High Risk (09/07/2021)   Overall Financial Resource Strain (CARDIA)    Difficulty of Paying Living Expenses: Hard  Food Insecurity: No Food Insecurity (04/16/2020)   Hunger Vital Sign    Worried About Running Out of Food in the Last Year: Never true    Ran Out of Food in the Last Year: Never true  Transportation Needs: Unmet Transportation Needs (11/26/2020)   PRAPARE - Administrator, Civil Service (Medical): Yes    Lack of Transportation (Non-Medical): Yes  Physical Activity: Insufficiently Active (04/16/2020)   Exercise Vital Sign    Days of Exercise per Week: 3 days    Minutes of Exercise per Session: 20 min  Stress: Not on file  Social Connections: Not on file    Allergies:  No Known Allergies  Objective:    Vital Signs:   Temp:  [98.1 F (36.7 C)] 98.1 F (36.7 C) (08/29 1048) Pulse Rate:  [82-97] 97 (08/29 1543) Resp:  [12-18] 18 (08/29 1543) BP: (103-146)/(69-102) 146/85 (08/29 1543) SpO2:  [97 %] 97 % (08/29 1543) Weight:  [93.3 kg-95.7 kg] 93.3 kg (08/29 1543)   Filed Weights   01/13/23 1048 01/13/23 1543  Weight: 95.7 kg 93.3 kg    Mean arterial Pressure 90  Physical Exam    General:  No resp difficulty HEENT: Normal Neck: supple. JVP flat  Carotids 2+ bilat; no bruits. No lymphadenopathy or thyromegaly appreciated. Cor: Mechanical heart sounds with LVAD hum present. Lungs: Clear Abdomen: soft, nontender, nondistended. No hepatosplenomegaly. No bruits or masses. Good bowel sounds. Driveline: Dressing in place.   Extremities: no cyanosis, clubbing, rash, edema Neuro: alert & orientedx3, cranial nerves grossly intact. moves all 4 extremities w/o difficulty. Affect pleasant   Telemetry   SR with PVCs   EKG   N/A  Labs    Basic Metabolic Panel: Recent Labs  Lab 01/07/23 1005 01/13/23 1054  NA 138 135  K 3.9 3.9  CL 104 109  CO2 19* 19*  GLUCOSE 100* 96  BUN 12 11  CREATININE 0.97 1.11  CALCIUM 9.0 8.4*    Liver Function Tests: Recent Labs  Lab 01/13/23 1054  AST 18  ALT 14  ALKPHOS 60  BILITOT 1.2  PROT 6.7  ALBUMIN 3.6   No results for input(s): "LIPASE", "AMYLASE" in the last 168 hours. No results for input(s): "AMMONIA" in the last 168 hours.  CBC: Recent Labs  Lab 01/07/23 1005 01/13/23 1054  WBC 3.8* 3.7*  HGB 11.5* 10.0*  HCT 36.4* 31.5*  MCV 86.3 87.3  PLT 218 171    Cardiac Enzymes: No results for input(s): "CKTOTAL", "CKMB", "CKMBINDEX", "TROPONINI" in the last 168 hours.  BNP: BNP (last 3 results) No results for input(s): "BNP" in the last 8760 hours.  ProBNP (last 3 results) No results for input(s): "PROBNP" in the last 8760 hours.   CBG: No results for input(s): "GLUCAP" in the last 168 hours.  Coagulation Studies: Recent Labs    01/11/23 0912 01/13/23 1054  LABPROT 26.4* 21.2*  INR 2.4* 1.8*    Other results: EKG: .   Imaging    DG Chest 2 View  Result Date: 01/13/2023 CLINICAL DATA:  Infection associated with  left ventricular assist device. EXAM: CHEST - 2 VIEW COMPARISON:  March 15, 2020. FINDINGS: Left ventricular assist device is unchanged in position. Sternotomy wires are noted. Stable cardiomediastinal silhouette. No acute pulmonary disease is noted. Bony thorax is unremarkable. IMPRESSION: No active cardiopulmonary disease. Electronically Signed   By: Lupita Raider M.D.   On: 01/13/2023 11:57      Patient Profile:  Jonathan Wood is a 52 year old with a history of HFrEF, NICM, ETOH abuse, HTN, smoker, anemia,  chronic anticoagulation, and HMIII LVAD implanted 2019. 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    Admitted observation status  after driveline debridement.      Assessment/Plan:   1. 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 He had been on suppressive antibiotics with keflex. 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  Followed by ID. New species identified--> 01/07/23 pseudomonas aeruginosa.  He was seen in the VAD clinic and set up for debridement.  Admitted observation after driveline debridement. WBC 3.7  Culture obtained . Start IV Cefepime. Discussed with pharmacy.  - Will need pain controlled prior to d/c.   2. Chronic Biventricular HF, HMIII LVAD Volume status stable.  Plan to continue entresto 97-103 twice a day, sildenafil 20 mg three times a day, and Spironolactone 12.5 mg daily  Follow VAD Parameters.  INR 1.8. Will get lower dose of coumadin tonight.  Follow INR/LDH Coumadin per Dr Maren Beach.  Check BMET   3. HTN Follow Maps.  Continue home HTN meds. He has not been taking cardura.  4. H/O GI bleed Continue PPI  No ASA  5. Tobacco Abuse  May need to change to inpatient status if he is unable to go home tomorrow.   I reviewed the LVAD parameters from today, and compared the results to the patient's prior recorded data.  No programming changes were made.  The LVAD is functioning within specified parameters.  The patient performs LVAD self-test daily.  LVAD interrogation was negative for any significant power changes, alarms or PI events/speed drops.  LVAD equipment check completed and is in good working order.  Back-up equipment present.   LVAD education done on emergency procedures and precautions and reviewed exit site care.  Length of Stay: 1  Tonye Becket, NP 01/13/2023, 4:13 PM  VAD Team Pager [phone] (7am - 7am) +++VAD ISSUES  ONLY+++   Advanced Heart Failure Team Pager [phone] (M-F; 7a - 5p)  Please contact [facility] for night-coverage after hours (5p -7a ) and weekends on [email or web address] for all non- LVAD Issues

## 2023-01-13 NOTE — Brief Op Note (Signed)
01/13/2023  2:43 PM  PATIENT:  Jonathan Wood  52 y.o. male  PRE-OPERATIVE DIAGNOSIS:  DRIVELINE INFECTION  POST-OPERATIVE DIAGNOSIS:  DRIVELINE INFECTION  PROCEDURE:  Procedure(s): VAD TUNNEL WOUND DEBRIDEMENT (N/A)  SURGEON:  Surgeons and Role:    Lovett Sox, MD - Primary  PHYSICIAN ASSISTANT: na  ASSISTANTS: none   ANESTHESIA:   local and MAC, topical lidocaine 25 cc  EBL: 5 ml  BLOOD ADMINISTERED:none  DRAINS: none   LOCAL MEDICATIONS USED:  LIDOCAINE  and Amount: 25 ml  SPECIMEN:  Biopsy / Limited Resection  DISPOSITION OF SPECIMEN:  PATHOLOGY, microbiology  COUNTS:  YES  TOURNIQUET:  * No tourniquets in log *  DICTATION: .Dragon Dictation  PLAN OF CARE: Admit to inpatient   PATIENT DISPOSITION:  PACU - hemodynamically stable.   Delay start of Pharmacological VTE agent (>24hrs) due to surgical blood loss or risk of bleeding: no

## 2023-01-13 NOTE — Anesthesia Postprocedure Evaluation (Signed)
Anesthesia Post Note  Patient: Jonathan Wood  Procedure(s) Performed: VAD TUNNEL WOUND DEBRIDEMENT     Patient location during evaluation: PACU Anesthesia Type: MAC Level of consciousness: awake and alert Pain management: pain level controlled Vital Signs Assessment: post-procedure vital signs reviewed and stable Respiratory status: spontaneous breathing, nonlabored ventilation, respiratory function stable and patient connected to nasal cannula oxygen Cardiovascular status: blood pressure returned to baseline and stable Postop Assessment: no apparent nausea or vomiting Anesthetic complications: no   No notable events documented.  Last Vitals:  Vitals:   01/13/23 1515 01/13/23 1530  BP: (!) 113/95 (!) 124/102  Pulse:    Resp: 18 15  Temp:    SpO2:      Last Pain:  Vitals:   01/13/23 1515  TempSrc:   PainSc: 3                  Mariann Barter

## 2023-01-13 NOTE — Op Note (Signed)
NAME: Jonathan Wood, Jonathan Wood MEDICAL RECORD NO: [id number] ACCOUNT NO: [id number] DATE OF BIRTH: 1970-11-20 FACILITY: [facility] LOCATION: [facility] PHYSICIAN: Kerin Perna III, MD  Operative Report   DATE OF PROCEDURE: 01/13/2023  OPERATION:   1.  Debridement of VAD power cord tunnel. 2.  Pulse lavage washout of the wound using Vashe wound solution.  SURGEON:  Kathlee Nations Trigt III, MD  PREOPERATIVE DIAGNOSIS:  History of HeartMate 3 implantation in 2018 with recent diagnosis of pseudomonas wound infection at the power cord exit site.  POSTOPERATIVE DIAGNOSES:  History of HeartMate 3 implantation in 2018 with recent diagnosis of pseudomonas wound infection at the power cord exit site.  ANESTHESIA:  MAC with topical 1% lidocaine.  DESCRIPTION OF PROCEDURE:  The patient was evaluated in preoperative holding where informed consent was documented and the procedure including risks and benefits were again reviewed with the patient.  He was brought to the OR by the OR staff and  anesthesia as well as the VAD coordinator who accompanied the patient through the entirety of the procedure to assist with operating the VAD equipment and to assist with hemodynamic measurements.  The patient was secured on the OR table and IV monitored sedation was started.  He was given MAC anesthesia with oral airway and he remained stable.  The previous dressings were removed and the left upper abdomen was prepped and draped as a sterile  field.  A proper timeout was performed.  A 30 mL of 1% lidocaine was infiltrated around the exit site of the power cord.  The wound was cultured with a deep culture swabs.  A circular incision was made around the exit site in all of the chronically infected indurated skin and subcutaneous  tissue was excised down to the abdominal fascia.  There is no pockets of pus or significant bleeding.  The wound was then irrigated with a liter of Vashe wound solution using pulse lavage.  Hemostasis was  obtained.  A 4 x 4 gauze soaked in Vashe wound  solution was then packed into the wound around the power cord and covered with a dry gauze and a sterile Tegaderm.  The patient was reversed from anesthesia and taken back to the recovery room in stable condition, accompanied by the VAD coordinator.   PUS D: 01/13/2023 3:05:14 pm T: 01/13/2023 4:22:00 pm  JOB: [id number]/ [id number]

## 2023-01-13 NOTE — Plan of Care (Signed)
  Problem: Education: Goal: Patient will understand all VAD equipment and how it functions Outcome: Progressing Goal: Patient will be able to verbalize current INR target range and antiplatelet therapy for discharge home Outcome: Progressing   Problem: Cardiac: Goal: LVAD will function as expected and patient will experience no clinical alarms Outcome: Progressing   Problem: Education: Goal: Knowledge of General Education information will improve Description: Including pain rating scale, medication(s)/side effects and non-pharmacologic comfort measures Outcome: Progressing   Problem: Health Behavior/Discharge Planning: Goal: Ability to manage health-related needs will improve Outcome: Progressing   Problem: Clinical Measurements: Goal: Ability to maintain clinical measurements within normal limits will improve Outcome: Progressing Goal: Will remain free from infection Outcome: Progressing Goal: Diagnostic test results will improve Outcome: Progressing Goal: Respiratory complications will improve Outcome: Progressing Goal: Cardiovascular complication will be avoided Outcome: Progressing   Problem: Activity: Goal: Risk for activity intolerance will decrease Outcome: Progressing   Problem: Nutrition: Goal: Adequate nutrition will be maintained Outcome: Progressing   Problem: Coping: Goal: Level of anxiety will decrease Outcome: Progressing   Problem: Elimination: Goal: Will not experience complications related to bowel motility Outcome: Progressing Goal: Will not experience complications related to urinary retention Outcome: Progressing   Problem: Pain Managment: Goal: General experience of comfort will improve Outcome: Progressing   Problem: Safety: Goal: Ability to remain free from injury will improve Outcome: Progressing   Problem: Skin Integrity: Goal: Risk for impaired skin integrity will decrease Outcome: Progressing   

## 2023-01-13 NOTE — Progress Notes (Signed)
VAD Coordinator Procedure Note:   VAD Coordinator met patient in SS 46. Pt undergoing driveline debridement per Dr. Maren Beach. Hemodynamics and VAD parameters monitored by myself and anesthesia throughout the procedure. Blood pressures were obtained with automatic cuff on right arm.    Time: Doppler Auto  BP Flow PI Power Speed  Pre-procedure:  1245  104/90 (96) 5.7 2.7 5.8 6500   1348  104/92(98) 6 2.1 5.8 6500           Sedation Induction: 1354  101/83(90) 6.1 1.8 5.8 6500   1400  78/58(65) 6.2 1.9 5.7 6500   1415  86/71(78) 6.2 1.9 5.8 6500   1430  85/64(72) 6.1 2.8 5.7 6500                    Recovery Area: 1450  108/69(79) 5.7 2.1 5.6 6500   1500  112/76(88) 5.3 2 5.6 6500    Patient tolerated the procedure well. VAD Coordinator accompanied and remained with patient in recovery area.    Patient Disposition: pt transported to [facility] and handoff given to bedside nurse.   Carlton Adam RN, BSN VAD Coordinator 24/7 Pager [phone]

## 2023-01-13 NOTE — Anesthesia Procedure Notes (Signed)
Procedure Name: MAC Date/Time: 01/13/2023 1:50 PM  Performed by: Waynard Edwards, CRNAPre-anesthesia Checklist: Patient identified, Emergency Drugs available, Suction available and Patient being monitored Patient Re-evaluated:Patient Re-evaluated prior to induction Oxygen Delivery Method: Simple face mask Induction Type: IV induction Placement Confirmation: positive ETCO2 Dental Injury: Teeth and Oropharynx as per pre-operative assessment

## 2023-01-13 NOTE — H&P (Signed)
Admission diagnosis-HeartMate 3 driveline tunnel infection, wound growing Pseudomonas  History of present illness:  Pt has been having pain / drainage in his driveline. Pt was started on Doxycycline last week by ID team in addition to the Keflex he already takes. Wound culture from 8/23 growing pseudomonas. Per ID team Doxycycline stopped yesterday, and started Cipro 750 mg BID x 2 weeks.   Dr Donata Clay saw pt to assess driveline condition. Will plan to perform debridement on Thursday in OR with admission to follow procedure. Will hold Coumadin tonight and tomorrow per Dr Donata Clay. Team made aware of surgical/admission plan. [name] PharmD aware of Coumadin plan.   Exit Site Care: Drive line is being maintained weekly by VAD coordinators. Existing VAD dressing removed and site care performed using sterile technique. Drive line exit site cleaned with VASHE solution and Chlora prep applicators x 2, RINSED WITH SALINE allowed to dry. 2 VASHE solution moistened 2 x 2 gauze placed at exit site, and several dry 4 x 4  gauze covering dressing reapplied. Anchor reapplied x 2. Drive line exit site unincorporated, the velour is fully implanted at exit site. Site tunnels approx 2 cm. Moderate amount of brown/green drainage with a foul odor, slight erythema, no tenderness. Pt denies fever or chills.        CT surgery wound care note  Patient examined, VAD tunnel wound inspected and sterile wet-to-dry dressing with Vashe solution personally performed.  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  This required extensive debridement and ultimately left pectoralis muscle flap coverage.  His VAD tunnel has been well-healed over the past 3 years with oral Keflex taken as prophylaxis.  The patient now presents with a new infection at the distal tunnel exit site with a new organism, Pseudomonas.  This is  probably due to external contamination at the exit site and fortunately probably not a deep seeded infection.  There is only tunneling of approximately 1 cm.  There is no proximal tenderness or signs of infection.  I do not think that a abdominal CT scan is indicated.  Although the wound is not of significant size, I do feel that aggressive surgical debridement is indicated because of the presence of a gram-negative organism which represents increased risk for proximal extension.  The patient will be prepared for admission later this week for surgical debridement of the exit site tissue.  This will remove the chronic indolent infected tissue and allow better  exposure to Vashe wound wet-to-dry dressings which will be significant to clear this infection around a foreign body (the power cord).             VAD Clinic Not    Past medical history    Jonathan Wood is a 52 y.o. 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   Admitted 4/20 with volume overload and anemia. Diuresed with IV lasix and transitioned to torsemide 20 mg daily. GI consulted EGD with gastritis and nonbleeding duodenal ulcers, colonoscopy with 5 polyps removed.   Admitted 12/20 for DL infection with overlying cellulitis and volume overload.    Admitted 4/21 for DL infection with large subxiphoid abscess. Underwent several debridements and eventually a pec muscle flap. Wound cx + MSSA. Initially on ancef and rifabutin but expanded to cefipime then changed to  IV ancef and po rifabutin. Now on oral keflex.   Readmitted 6/21 with superficial abscess and tachypnea. Superficial abscess I&D'd and packed. CT scan done and showed possible deeper fluid collection. Reviewed with TCTS who felt it was muscle flap. Sent to IR for possible drainage but u/s confirmed it was the muscle flap and no drainable fluid collection. Wound cx negative. ID  saw and recommended continuing Keflex.    Admitted 8/21 with lactic acidosis due to cardiogenic shock. Started on 0.25 of milrinone and NE. Ramp echo on 8/13 with severe RV dysfunction. Speed increased 5600-> 6300. Tolerated ok. Repeat ramp echo on 8/20 speed increased to 6500. Diuresed over 40 pounds.    Admitted 10/21 with  ETOH pancreatitis. Gallbladder ok. Resolved with medical management     VAD Indication: Destination Therapy    VAD interrogation & Equipment Management: Speed: 6500 Flow: 5.9 Power: 6.1 w    PI: 2.9 Hct: 39   Alarms: few LV Events: 20-30 daily   Primary Controller:  Replace back up battery in 31 months Back up controller: did not bring   Annual Equipment Maintenance on UBC/PM was performed 09/16/21    I reviewed the LVAD parameters from today and compared the results to the patient's prior recorded data.  LVAD interrogation was NEGATIVE for significant power changes, NEGATIVE for clinical alarms and STABLE for PI events/speed drops. No programming changes were made and pump is functioning within specified parameters.       Past Medical History:  Diagnosis Date   Asthma     CHF (congestive heart failure) (HCC)      a. 09/2016: EF 20-25% with cath showing normal cors   GERD (gastroesophageal reflux disease)     History of hiatal hernia     LVAD (left ventricular assist device) present (HCC)     OSA on CPAP 09/06/2018    Severe OSA with AHI 68/hr on CPAP at 12cm H2O             Past Surgical History:  Procedure Laterality Date   APPLICATION OF A-CELL OF CHEST/ABDOMEN N/A 08/24/2019    Procedure: Application Of A-Cell Of Chest/Abdomen;  Surgeon: Kerin Perna, MD;  Location: [facility] OR;  Service: Thoracic;  Laterality: N/A;   APPLICATION OF A-CELL OF CHEST/ABDOMEN N/A 09/05/2019    Procedure: Application Of A-Cell Of Chest/Abdomen;  Surgeon: Peggye Form, DO;  Location: [facility] OR;  Service: Plastics;  Laterality: N/A;   APPLICATION OF A-CELL OF  CHEST/ABDOMEN   09/03/2019    Procedure: Application Of A-Cell Of Chest/Abdomen;  Surgeon: Kerin Perna, MD;  Location: [facility] OR;  Service: Thoracic;;   APPLICATION OF WOUND VAC N/A 08/22/2019    Procedure: Debridement, Irrigation and Packing of Abdominal Incision.;  Surgeon: Kerin Perna, MD;  Location: [facility] OR;  Service: Thoracic;  Laterality: N/A;   APPLICATION OF WOUND VAC N/A 08/24/2019    Procedure: Irrigation and Debridement with WOUND VAC APPLICATION;  Surgeon: Kerin Perna, MD;  Location: [facility] OR;  Service: Thoracic;  Laterality: N/A;   APPLICATION OF WOUND VAC N/A 08/30/2019    Procedure: APPLICATION OF WOUND VAC;  Surgeon: Kerin Perna, MD;  Location: [facility] OR;  Service: Thoracic;  Laterality: N/A;   APPLICATION OF WOUND VAC N/A 09/03/2019    Procedure: WOUND VAC CHANGE;  Surgeon: Kerin Perna, MD;  Location: [facility] OR;  Service: Thoracic;  Laterality: N/A;   BIOPSY   08/23/2018    Procedure: BIOPSY;  Surgeon: Corliss Parish  Montez Hageman., MD;  Location: [facility] ENDOSCOPY;  Service: Gastroenterology;;   COLONOSCOPY N/A 08/23/2018    Procedure: COLONOSCOPY;  Surgeon: Lemar Lofty., MD;  Location: [facility] ENDOSCOPY;  Service: Gastroenterology;  Laterality: N/A;   ENTEROSCOPY N/A 08/23/2018    Procedure: ENTEROSCOPY;  Surgeon: Meridee Score Netty Starring., MD;  Location: [facility] ENDOSCOPY;  Service: Gastroenterology;  Laterality: N/A;   HEMOSTASIS CLIP PLACEMENT   08/23/2018    Procedure: HEMOSTASIS CLIP PLACEMENT;  Surgeon: Lemar Lofty., MD;  Location: [facility] ENDOSCOPY;  Service: Gastroenterology;;   IABP INSERTION N/A 09/05/2017    Procedure: IABP INSERTION;  Surgeon: Dolores Patty, MD;  Location: [facility] INVASIVE CV LAB;  Service: Cardiovascular;  Laterality: N/A;   INSERTION OF IMPLANTABLE LEFT VENTRICULAR ASSIST DEVICE N/A 09/06/2017    Procedure: INSERTION OF IMPLANTABLE LEFT VENTRICULAR ASSIST DEVICE - HM3;  Surgeon: Kerin Perna, MD;  Location: [facility] OR;  Service: Open Heart Surgery;  Laterality:  N/A;  HM3   MUSCLE FLAP CLOSURE N/A 09/05/2019    Procedure: MUSCLE FLAP CLOSURE;  Surgeon: Peggye Form, DO;  Location: [facility] OR;  Service: Plastics;  Laterality: N/A;   NASAL FRACTURE SURGERY   1987   POLYPECTOMY   08/23/2018    Procedure: POLYPECTOMY;  Surgeon: Mansouraty, Netty Starring., MD;  Location: [facility] ENDOSCOPY;  Service: Gastroenterology;;   RIGHT HEART CATH N/A 08/30/2017    Procedure: RIGHT HEART CATH;  Surgeon: Dolores Patty, MD;  Location: [facility] INVASIVE CV LAB;  Service: Cardiovascular;  Laterality: N/A;   RIGHT/LEFT HEART CATH AND CORONARY ANGIOGRAPHY N/A 10/08/2016    Procedure: Right/Left Heart Cath and Coronary Angiography;  Surgeon: Orpah Cobb, MD;  Location: [facility] INVASIVE CV LAB;  Service: Cardiovascular;  Laterality: N/A;   STERNAL WOUND DEBRIDEMENT N/A 08/20/2019    Procedure: DEBRIDEMENT OF LVAD DRIVELINE TUNNEL;  Surgeon: Kerin Perna, MD;  Location: [facility] OR;  Service: Thoracic;  Laterality: N/A;   STERNAL WOUND DEBRIDEMENT N/A 09/05/2019    Procedure: DEBRIDEMENT AND CLOSURE OF ABDOMINAL WOUND;  Surgeon: Peggye Form, DO;  Location: [facility] OR;  Service: Plastics;  Laterality: N/A;   SUBMUCOSAL TATTOO INJECTION   08/23/2018    Procedure: SUBMUCOSAL TATTOO INJECTION;  Surgeon: Meridee Score Netty Starring., MD;  Location: [facility] ENDOSCOPY;  Service: Gastroenterology;;   TEE WITHOUT CARDIOVERSION N/A 09/06/2017    Procedure: TRANSESOPHAGEAL ECHOCARDIOGRAM (TEE);  Surgeon: Donata Clay, Theron Arista, MD;  Location: [facility] OR;  Service: Open Heart Surgery;  Laterality: N/A;   WOUND DEBRIDEMENT N/A 08/30/2019    Procedure: Debridement Abdominal Wound;  Surgeon: Kerin Perna, MD;  Location: [facility] OR;  Service: Thoracic;  Laterality: N/A;                  Current Outpatient Medications  Medication Sig Dispense Refill   acetaminophen (TYLENOL) 500 MG tablet Take 1,500 mg by mouth 3 (three) times daily as needed for headache (pain).       albuterol (PROAIR HFA) 108 (90 Base) MCG/ACT inhaler  Inhale 2 puffs into the lungs every 6 (six) hours as needed for wheezing or shortness of breath. (Patient not taking: Reported on 11/11/2021) 1 each 6   cephALEXin (KEFLEX) 500 MG capsule Take 1 capsule (500 mg total) by mouth 3 (three) times daily. 90 capsule 11   doxazosin (CARDURA) 2 MG tablet TAKE 1/2 TABLET BY MOUTH AT BEDTIME 45 tablet 1   enoxaparin (LOVENOX) 40 MG/0.4ML injection Inject 0.4 mLs (40 mg total) into the skin every 12 (twelve) hours. (Patient not taking: Reported  on 01/27/2022) 4 mL 0   gabapentin (NEURONTIN) 300 MG capsule TAKE ONE CAPSULE BY MOUTH THREE TIMES DAILY AT 9am, 3pm, AND 9pm 90 capsule 5   nicotine (NICODERM CQ - DOSED IN MG/24 HOURS) 21 mg/24hr patch Place 1 patch (21 mg total) onto the skin daily. (Patient not taking: Reported on 04/29/2021) 28 patch 3   pantoprazole (PROTONIX) 40 MG tablet Take 1 tablet (40 mg total) by mouth 2 (two) times daily. 180 tablet 3   sacubitril-valsartan (ENTRESTO) 97-103 MG Take 1 tablet by mouth 2 (two) times daily. 60 tablet 6   sildenafil (REVATIO) 20 MG tablet Take 1 tablet (20 mg total) by mouth 3 (three) times daily. 90 tablet 11   sildenafil (VIAGRA) 100 MG tablet Take 1 tablet (100 mg total) by mouth daily as needed for erectile dysfunction. 10 tablet 5   spironolactone (ALDACTONE) 25 MG tablet Take 0.5 tablets (12.5 mg total) by mouth daily. 30 tablet 6   torsemide (DEMADEX) 20 MG tablet Take 1 tablet (20 mg total) by mouth every other day. (Patient not taking: Reported on 06/26/2020) 90 tablet 3   traMADol HCl 100 MG TABS Take 100 mg by mouth every 8 (eight) hours as needed. (Patient not taking: Reported on 09/07/2021) 10 tablet 0   warfarin (COUMADIN) 5 MG tablet Take 1.5 tablets (7.5 mg total) by mouth daily at 4 PM.  7.5 mg (1.5 tab) all days OR as directed by HF clinic 90 tablet 5      No current facility-administered medications for this encounter.        Allergies:   Patient has no known allergies.    Social History:   The patient  reports that he has quit smoking. His smoking use included cigarettes. He has a 12.50 pack-year smoking history. He has never used smokeless tobacco. He reports that he does not currently use alcohol. He reports that he does not currently use drugs after having used the following drugs: Marijuana.    Family History:  The patient's family history includes Alcohol abuse in his cousin; Hypertension in his father.    ROS:  Please see the history of present illness.   All other systems are personally reviewed and negative.      Vital Signs: Doppler: 128 Automatic BP: 117/80 (99) HR: 80 SPO2: 99%   Weight: 210.6 lbs w/ eqt Last wt: 204.8 lbs w/ equip      Exam: General:  NAD.  HEENT: normal  Neck: supple. JVP not elevated.  Carotids 2+ bilat; no bruits. No lymphadenopathy or thryomegaly appreciated. Cor: LVAD hum.  Lungs: Clear. Abdomen: soft, nontender, non-distended. No hepatosplenomegaly. No bruits or masses. Good bowel sounds. Driveline site clean. Anchor in place.  Extremities: no cyanosis, clubbing, rash. Warm no edema  Neuro: alert & oriented x 3. No focal deficits. Moves all 4 without problem                       ASSESSMENT AND PLAN:     1. 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 Treated with milrinone and then weaned off.  Diuresed 40 pounds. VAD speed increased 5600 -> 6300 - He continues to do very well. NYHA I - Volume status looks good - Continue Entresto and spiro.  - We have recently been discussing transplant. He is interested but needs to stop smoking and have 6  months of negative cotinine tests. We again encouraged him to work on smoking cessation. Discussed again today   2. HM3 LVAD 09/06/2017.  - VAD interrogated personally. Parameters stable. -Patient developed MRSA infection of the VAD pump outflow and relief requiring debridement and  muscle flap coverage 2021   -Driveline exit site with heavy drainage and surrounding tissue with induration chronically infected with increased drainage, tenderness and now culture positive for Pseudomonas    3.  Essential HTN - Blood pressure well controlled. Continue current regimen.   4. H/o ETOH pancreatitis, 11/21 - remains quit from drinking (was stressed about his son)   5. Recurrent DL infection with subxiphoid abscess - s/p debridement and pec flap in 4/21 - remains on keflex. Site continues to look good - No fevers or chills   6.  OSA - sleep study with very severe OSA (AHI 69/hr) - He has refused therapy multiple times but seems to have improved with weight loss and ETOH cessation. We have offered repeat testing to see if OSA has improved but he refuses.  - No change   7.  H/o GI bleed  - 08/2018 EGD showed gastritis and nonbleeding duodenal ulcers, colonoscopy with 5 polyps removed.  - Remains on PPI. No recent GI bleeding - hgb pending   8. Tobacco Abuse  - continues to smoke a few cigs/day. No change - we discussed again that he needs to stop smoking to qualify for transplant.  - we will give him nicotine gum to use           Plan surgical debridement of HeartMate 3 driveline tunnel exit site August 29 at [facility] with anesthesia.  Patient understands the rationale for this procedure and agrees to proceed.  He will continue his oral Cipro antibiotic for the positive Pseudomonas wound culture and hold his Coumadin until surgery.  Lovett Sox MD

## 2023-01-14 ENCOUNTER — Other Ambulatory Visit (HOSPITAL_COMMUNITY): Payer: Self-pay | Admitting: *Deleted

## 2023-01-14 ENCOUNTER — Encounter (HOSPITAL_COMMUNITY): Payer: Self-pay | Admitting: Cardiothoracic Surgery

## 2023-01-14 DIAGNOSIS — Z7901 Long term (current) use of anticoagulants: Secondary | ICD-10-CM | POA: Diagnosis not present

## 2023-01-14 DIAGNOSIS — T829XXA Unspecified complication of cardiac and vascular prosthetic device, implant and graft, initial encounter: Secondary | ICD-10-CM | POA: Diagnosis present

## 2023-01-14 DIAGNOSIS — Y831 Surgical operation with implant of artificial internal device as the cause of abnormal reaction of the patient, or of later complication, without mention of misadventure at the time of the procedure: Secondary | ICD-10-CM | POA: Diagnosis present

## 2023-01-14 DIAGNOSIS — Z79899 Other long term (current) drug therapy: Secondary | ICD-10-CM | POA: Diagnosis not present

## 2023-01-14 DIAGNOSIS — Z792 Long term (current) use of antibiotics: Secondary | ICD-10-CM | POA: Diagnosis not present

## 2023-01-14 DIAGNOSIS — Z5986 Financial insecurity: Secondary | ICD-10-CM | POA: Diagnosis not present

## 2023-01-14 DIAGNOSIS — F17210 Nicotine dependence, cigarettes, uncomplicated: Secondary | ICD-10-CM | POA: Diagnosis present

## 2023-01-14 DIAGNOSIS — Z635 Disruption of family by separation and divorce: Secondary | ICD-10-CM | POA: Diagnosis not present

## 2023-01-14 DIAGNOSIS — I5082 Biventricular heart failure: Secondary | ICD-10-CM | POA: Diagnosis present

## 2023-01-14 DIAGNOSIS — G4733 Obstructive sleep apnea (adult) (pediatric): Secondary | ICD-10-CM | POA: Diagnosis present

## 2023-01-14 DIAGNOSIS — Z8249 Family history of ischemic heart disease and other diseases of the circulatory system: Secondary | ICD-10-CM | POA: Diagnosis not present

## 2023-01-14 DIAGNOSIS — T827XXA Infection and inflammatory reaction due to other cardiac and vascular devices, implants and grafts, initial encounter: Secondary | ICD-10-CM | POA: Diagnosis present

## 2023-01-14 DIAGNOSIS — Z8614 Personal history of Methicillin resistant Staphylococcus aureus infection: Secondary | ICD-10-CM | POA: Diagnosis not present

## 2023-01-14 DIAGNOSIS — K219 Gastro-esophageal reflux disease without esophagitis: Secondary | ICD-10-CM | POA: Diagnosis present

## 2023-01-14 DIAGNOSIS — Z5982 Transportation insecurity: Secondary | ICD-10-CM | POA: Diagnosis not present

## 2023-01-14 DIAGNOSIS — B965 Pseudomonas (aeruginosa) (mallei) (pseudomallei) as the cause of diseases classified elsewhere: Secondary | ICD-10-CM | POA: Diagnosis present

## 2023-01-14 DIAGNOSIS — A498 Other bacterial infections of unspecified site: Secondary | ICD-10-CM | POA: Insufficient documentation

## 2023-01-14 DIAGNOSIS — I428 Other cardiomyopathies: Secondary | ICD-10-CM | POA: Diagnosis present

## 2023-01-14 DIAGNOSIS — I110 Hypertensive heart disease with heart failure: Secondary | ICD-10-CM | POA: Diagnosis present

## 2023-01-14 DIAGNOSIS — Z8619 Personal history of other infectious and parasitic diseases: Secondary | ICD-10-CM | POA: Diagnosis not present

## 2023-01-14 DIAGNOSIS — I5022 Chronic systolic (congestive) heart failure: Secondary | ICD-10-CM

## 2023-01-14 DIAGNOSIS — Z8711 Personal history of peptic ulcer disease: Secondary | ICD-10-CM | POA: Diagnosis not present

## 2023-01-14 DIAGNOSIS — Z95811 Presence of heart assist device: Secondary | ICD-10-CM | POA: Diagnosis not present

## 2023-01-14 DIAGNOSIS — J45909 Unspecified asthma, uncomplicated: Secondary | ICD-10-CM | POA: Diagnosis present

## 2023-01-14 DIAGNOSIS — F1011 Alcohol abuse, in remission: Secondary | ICD-10-CM | POA: Diagnosis present

## 2023-01-14 DIAGNOSIS — B9561 Methicillin susceptible Staphylococcus aureus infection as the cause of diseases classified elsewhere: Secondary | ICD-10-CM | POA: Diagnosis present

## 2023-01-14 DIAGNOSIS — Z811 Family history of alcohol abuse and dependence: Secondary | ICD-10-CM | POA: Diagnosis not present

## 2023-01-14 LAB — PROTIME-INR
INR: 1.6 — ABNORMAL HIGH (ref 0.8–1.2)
Prothrombin Time: 19.5 s — ABNORMAL HIGH (ref 11.4–15.2)

## 2023-01-14 LAB — LACTATE DEHYDROGENASE: LDH: 147 U/L (ref 98–192)

## 2023-01-14 LAB — CBC
HCT: 29.3 % — ABNORMAL LOW (ref 39.0–52.0)
Hemoglobin: 9.3 g/dL — ABNORMAL LOW (ref 13.0–17.0)
MCH: 27.6 pg (ref 26.0–34.0)
MCHC: 31.7 g/dL (ref 30.0–36.0)
MCV: 86.9 fL (ref 80.0–100.0)
Platelets: 151 10*3/uL (ref 150–400)
RBC: 3.37 MIL/uL — ABNORMAL LOW (ref 4.22–5.81)
RDW: 13.1 % (ref 11.5–15.5)
WBC: 5 10*3/uL (ref 4.0–10.5)
nRBC: 0 % (ref 0.0–0.2)

## 2023-01-14 LAB — BASIC METABOLIC PANEL
Anion gap: 9 (ref 5–15)
BUN: 13 mg/dL (ref 6–20)
CO2: 22 mmol/L (ref 22–32)
Calcium: 8.3 mg/dL — ABNORMAL LOW (ref 8.9–10.3)
Chloride: 107 mmol/L (ref 98–111)
Creatinine, Ser: 0.95 mg/dL (ref 0.61–1.24)
GFR, Estimated: >60 mL/min (ref >60)
Glucose, Bld: 156 mg/dL — ABNORMAL HIGH (ref 70–99)
Potassium: 3.8 mmol/L (ref 3.5–5.1)
Sodium: 138 mmol/L (ref 135–145)

## 2023-01-14 MED ORDER — WARFARIN SODIUM 5 MG PO TABS
10.0000 mg | ORAL_TABLET | Freq: Once | ORAL | Status: AC
  Administered 2023-01-14: 10 mg via ORAL
  Filled 2023-01-14: qty 2

## 2023-01-14 MED ORDER — WARFARIN SODIUM 5 MG PO TABS: 5.0000 mg | ORAL_TABLET | Freq: Every day | ORAL | Status: DC

## 2023-01-14 MED ORDER — WARFARIN SODIUM 7.5 MG PO TABS: 7.5000 mg | ORAL_TABLET | Freq: Once | ORAL | Status: DC

## 2023-01-14 MED ORDER — WARFARIN - PHARMACIST DOSING INPATIENT: Freq: Every day | Status: DC

## 2023-01-14 NOTE — Progress Notes (Signed)
LVAD Coordinator Rounding Note:  Admitted 01/13/23 to Heart Failure Service following driveline debridement with Dr Maren Beach.   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 Maren Beach under Destination Therapy criteria.  Pt laying in bed this morning. Pt had a good night. Minimal pain. Will plan to send pt home tomorrow on PO Cipro and his chronic Keflex per Dr Donata Clay. Pt has dressing change appt on Tuesday at 0930 in VAD clinic.  Vital signs: Temp: 97.6 HR: 77 Doppler Pressure: 92 Automatic BP: 111/81 (92) O2 Sat: 99% RA Wt:  208.1 lbs   LVAD interrogation reveals:  Speed: 6500 Flow: 5.9 Power: 5.7 w PI: 2.2 Alarms: none Events: 2 PI Hematocrit: 29  Fixed speed: 6500 Low speed limit: 6000  Drive Line: Existing VAD dressing removed and site care performed using sterile technique. Wound bed cleansed with VASHE solution and sterile 4x4 gauze through light manual debridement. Skin surrounding wound bed cleaned with Chlora prep applicators x 2, allowed to dry. VASHE moistened 4 x 4 placed in wound bed, covered with several dry 4 x 4s.  Drive line unincorporated. Moderate amount of serosanguinous drainage noted. No redness, tenderness, foul odor or rash noted. Drive line anchor secured. Daily wet/dry dressing changes by bedside nurse using Vashe. Next dressing change due 01/15/23 prior to d/c     Labs:  LDH trend: 147  INR trend: 1.6  Anticoagulation Plan: -INR Goal: 2.0 - 2.5 -ASA Dose: none  Device:  N/A  Plan/Recommendations:  1. Call VAD pager for VAD equipment or drive line issues. 2. Weekly dressing changes per BS nurse. 3. For discharge home tomorrow. Follow up in VAD clinic scheduled for dressing change 01/18/23 @ 0930 4. Bedside nurse to do dressing change prior to d/c tomorrow    Carlton Adam RN VAD Coordinator  Office: [phone]  24/7 Pager: [phone]

## 2023-01-14 NOTE — TOC Initial Note (Addendum)
Transition of Care [facility]) - Initial/Assessment Note    Patient Details  Name: Jonathan Wood MRN: [id number] Date of Birth: 15-Oct-1970  Transition of Care [facility]) CM/SW Contact:    Nicanor Bake Phone Number: [phone] 01/14/2023, 2:28 PM  Clinical Narrative: HF CSW met wit the pt at bedside. CSW and pt built rapport. Pt stated that he live at home with two adult children and one minor child. Pt stated that he is separated from his partner. Pt stated that he has no history with HH services. Pt stated that he had someone come in and help with LVAD changes. Pt stated that he does not use any equipment. Pt stated that he does have a a scale at home. CSW explained to the pt that a PCP appointment will be scheduled closer to dc. Pt agreed. TOC will continue following.                 Expected Discharge Plan: Home/Self Care Barriers to Discharge: Continued Medical Work up   Patient Goals and CMS Choice Patient states their goals for this hospitalization and ongoing recovery are:: return home with kids          Expected Discharge Plan and Services       Living arrangements for the past 2 months: Single Family Home                                      Prior Living Arrangements/Services Living arrangements for the past 2 months: Single Family Home Lives with:: Adult Children, Minor Children Patient language and need for interpreter reviewed:: Yes Do you feel safe going back to the place where you live?: Yes      Need for Family Participation in Patient Care: Yes (Comment) Care giver support system in place?: Yes (comment)   Criminal Activity/Legal Involvement Pertinent to Current Situation/Hospitalization: No - Comment as needed  Activities of Daily Living Home Assistive Devices/Equipment: Eyeglasses ADL Screening (condition at time of admission) Patient's cognitive ability adequate to safely complete daily activities?: Yes Is the patient deaf or have  difficulty hearing?: No Does the patient have difficulty seeing, even when wearing glasses/contacts?: No Does the patient have difficulty concentrating, remembering, or making decisions?: No Patient able to express need for assistance with ADLs?: Yes Does the patient have difficulty dressing or bathing?: No Independently performs ADLs?: Yes (appropriate for developmental age) Does the patient have difficulty walking or climbing stairs?: No Weakness of Legs: None Weakness of Arms/Hands: None  Permission Sought/Granted                  Emotional Assessment Appearance:: Appears stated age Attitude/Demeanor/Rapport: Engaged Affect (typically observed): Appropriate Orientation: : Oriented to Self, Oriented to Place, Oriented to  Time, Oriented to Situation Alcohol / Substance Use: Not Applicable Psych Involvement: No (comment)  Admission diagnosis:  Complication involving left ventricular assist device (LVAD) [H47.9XXA] Patient Active Problem List   Diagnosis Date Noted   Pseudomonas infection 01/14/2023   Complication involving left ventricular assist device (LVAD) 01/13/2023   CHF (congestive heart failure) (HCC)    Acute pancreatitis 03/15/2020   Alcohol use disorder, moderate, in early remission (HCC) 01/01/2020   Lactic acidosis 12/28/2019   Hypoglycemia 12/28/2019   Pain, dental 12/28/2019   Generalized anxiety disorder 09/03/2019   Cigarette smoker 08/18/2019   COPD (chronic obstructive pulmonary disease) (HCC) 08/18/2019   Hypertension 08/18/2019  Bacteremia due to methicillin susceptible Staphylococcus aureus (MSSA) 08/18/2019   Sepsis (HCC) 08/16/2019   Increased abdominal girth 10/11/2018   Chronic constipation 10/11/2018   Rectal bleeding 10/11/2018   History of colonic polyps 09/24/2018   Hemorrhoids 09/24/2018   Long term current use of anticoagulant therapy 09/24/2018   OSA on CPAP 09/06/2018   Infection associated with driveline of left ventricular assist  device (LVAD) (HCC) 02/06/2018   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/26/2017   Anxiety 09/30/2017   Normocytic anemia 09/30/2017   LVAD (left ventricular assist device) present [facility])    Advanced care planning/counseling discussion    Goals of care, counseling/discussion    Palliative care encounter    Elevated LFTs 08/25/2017   PCP:  Dolores Patty, MD Pharmacy:   [facility] - [place], [place] - [address] [place] [address] Phone: [phone] Fax: [phone]  [facility] - [place], [place] - [address] [place] [address] Phone: [phone] Fax: [phone]     Social Determinants of Health (SDOH) Social History: SDOH Screenings   Food Insecurity: No Food Insecurity (01/14/2023)  Housing: Low Risk  (01/14/2023)  Transportation Needs: No Transportation Needs (01/14/2023)  Utilities: Not At Risk (01/14/2023)  Alcohol Screen: Low Risk  (01/14/2023)  Depression (PHQ2-9): Medium Risk (09/25/2019)  Financial Resource Strain: Low Risk  (01/14/2023)  Physical Activity: Sufficiently Active (01/14/2023)  Stress: No Stress Concern Present (01/14/2023)  Tobacco Use: Medium Risk (01/13/2023)   SDOH Interventions:     Readmission Risk Interventions     No data to display

## 2023-01-14 NOTE — TOC Progression Note (Signed)
Transition of Care [facility]) - Progression Note    Patient Details  Name: Jonathan Wood MRN: [id number] Date of Birth: September 03, 1970  Transition of Care [facility]) CM/SW Contact  Nicanor Bake Phone Number: [phone] 01/14/2023, 2:39 PM  Clinical Narrative:  HF CSW called and scheduled a follow up hospital appointment at [facility] PCP for Friday, February 11, 2023 at 2:00 pm. TOC will continue following.     Expected Discharge Plan: Home/Self Care Barriers to Discharge: Continued Medical Work up  Expected Discharge Plan and Services       Living arrangements for the past 2 months: Single Family Home                                       Social Determinants of Health (SDOH) Interventions SDOH Screenings   Food Insecurity: No Food Insecurity (01/14/2023)  Housing: Low Risk  (01/14/2023)  Transportation Needs: No Transportation Needs (01/14/2023)  Utilities: Not At Risk (01/14/2023)  Alcohol Screen: Low Risk  (01/14/2023)  Depression (PHQ2-9): Medium Risk (09/25/2019)  Financial Resource Strain: Low Risk  (01/14/2023)  Physical Activity: Sufficiently Active (01/14/2023)  Stress: No Stress Concern Present (01/14/2023)  Tobacco Use: Medium Risk (01/13/2023)    Readmission Risk Interventions     No data to display

## 2023-01-14 NOTE — Progress Notes (Addendum)
ANTICOAGULATION CONSULT NOTE  Pharmacy Consult for warfarin Indication:  LVAD  No Known Allergies  Patient Measurements: Height: 6\' 2"  (188 cm) Weight: 94.4 kg (208 lb 1.8 oz) IBW/kg (Calculated) : 82.2  Vital Signs: Temp: 97.6 F (36.4 C) (08/30 1117) Temp Source: Oral (08/30 1117) BP: 95/63 (08/30 1117) Pulse Rate: 77 (08/30 1117)  Labs: Recent Labs    01/13/23 1054 01/13/23 1807 01/14/23 0245  HGB 10.0*  --  9.3*  HCT 31.5*  --  29.3*  PLT 171  --  151  APTT 38*  --   --   LABPROT 21.2* 21.8* 19.5*  INR 1.8* 1.9* 1.6*  CREATININE 1.11  --  0.95    Estimated Creatinine Clearance: 109.4 mL/min (by C-G formula based on SCr of 0.95 mg/dL).   Medical History: Past Medical History:  Diagnosis Date   Asthma    CHF (congestive heart failure) (HCC)    a. 09/2016: EF 20-25% with cath showing normal cors   GERD (gastroesophageal reflux disease)    History of hiatal hernia    LVAD (left ventricular assist device) present (HCC)    OSA on CPAP 09/06/2018   Severe OSA with AHI 68/hr on CPAP at 12cm H2O    Assessment: 89 yoM with LVAD admitted with DLI. Warfarin held preop, debridement done 8/29.  INR today 1.6, LDH ok, CBC stable.  Home warfarin dose is 10mg  Mon/Fri, 7.5mg  AODs  Goal of Therapy:  INR 2-2.5 Monitor platelets by anticoagulation protocol: Yes   Plan:  Warfarin 10mg  x1 tonight  Daily INR - if drops further may need low dose heparin   Fredonia Highland, PharmD, BCPS, [facility] Clinical Pharmacist [phone] Please check AMION for all [facility] Pharmacy numbers 01/14/2023

## 2023-01-14 NOTE — Progress Notes (Addendum)
Advanced Heart Failure VAD Team Note  PCP-Cardiologist: Arvilla Meres, MD   Subjective:    Admitted for observation post DL site I&D 1/30  Feels fine this morning. Pain well managed.   LVAD INTERROGATION:  HeartMate III LVAD:   Flow 6 liters/min, speed 6500, power 5.7, PI 2 22 PI events today.    Objective:    Vital Signs:   Temp:  [97.6 F (36.4 C)-98.1 F (36.7 C)] 97.8 F (36.6 C) (08/30 0339) Pulse Rate:  [64-97] 64 (08/29 1952) Resp:  [12-20] 20 (08/30 0339) BP: (102-146)/(69-102) 122/83 (08/30 0339) SpO2:  [97 %-99 %] 99 % (08/30 0339) Weight:  [93.3 kg-95.7 kg] 94.4 kg (08/30 0412)   Mean arterial Pressure 90  Intake/Output:   Intake/Output Summary (Last 24 hours) at 01/14/2023 0759 Last data filed at 01/14/2023 0544 Gross per 24 hour  Intake 1263.9 ml  Output --  Net 1263.9 ml     Physical Exam  General:  Well appearing. No resp difficulty HEENT: normal Neck: supple. JVP . Carotids 2+ bilat; no bruits. No lymphadenopathy or thyromegaly appreciated. Cor: Mechanical heart sounds with LVAD hum present. Lungs: clear Abdomen: soft, nontender, nondistended. No hepatosplenomegaly. No bruits or masses. Good bowel sounds. Driveline: C/D/I; securement device intact. Gauze/tegaderm dressing around DL exit site Extremities: no cyanosis, clubbing, rash, edema Neuro: alert & orientedx3, cranial nerves grossly intact. moves all 4 extremities w/o difficulty. Affect pleasant Telemetry   NSR 60s (Personally reviewed)    EKG    N/A  Labs   Basic Metabolic Panel: Recent Labs  Lab 01/07/23 1005 01/13/23 1054 01/14/23 0245  NA 138 135 138  K 3.9 3.9 3.8  CL 104 109 107  CO2 19* 19* 22  GLUCOSE 100* 96 156*  BUN 12 11 13   CREATININE 0.97 1.11 0.95  CALCIUM 9.0 8.4* 8.3*    Liver Function Tests: Recent Labs  Lab 01/13/23 1054  AST 18  ALT 14  ALKPHOS 60  BILITOT 1.2  PROT 6.7  ALBUMIN 3.6   No results for input(s): "LIPASE", "AMYLASE" in the  last 168 hours. No results for input(s): "AMMONIA" in the last 168 hours.  CBC: Recent Labs  Lab 01/07/23 1005 01/13/23 1054 01/14/23 0245  WBC 3.8* 3.7* 5.0  HGB 11.5* 10.0* 9.3*  HCT 36.4* 31.5* 29.3*  MCV 86.3 87.3 86.9  PLT 218 171 151    INR: Recent Labs  Lab 01/07/23 1005 01/11/23 0912 01/13/23 1054 01/13/23 1807 01/14/23 0245  INR 2.2* 2.4* 1.8* 1.9* 1.6*    Other results: EKG:    Imaging   DG Chest 2 View  Result Date: 01/13/2023 CLINICAL DATA:  Infection associated with left ventricular assist device. EXAM: CHEST - 2 VIEW COMPARISON:  March 15, 2020. FINDINGS: Left ventricular assist device is unchanged in position. Sternotomy wires are noted. Stable cardiomediastinal silhouette. No acute pulmonary disease is noted. Bony thorax is unremarkable. IMPRESSION: No active cardiopulmonary disease. Electronically Signed   By: Lupita Raider M.D.   On: 01/13/2023 11:57     Medications:     Scheduled Medications:  gabapentin  900 mg Oral TID   pantoprazole  40 mg Oral BID   sacubitril-valsartan  1 tablet Oral BID   sildenafil  20 mg Oral TID   sodium chloride flush  3 mL Intravenous Q12H   spironolactone  12.5 mg Oral Daily   warfarin  5 mg Oral Daily   Warfarin - Physician Dosing Inpatient   Does not apply q1600  Infusions:  sodium chloride     ceFEPime (MAXIPIME) IV Stopped (01/14/23 0413)   dextrose 5% lactated ringers 50 mL/hr at 01/14/23 0544   lactated ringers 10 mL/hr at 01/14/23 0544    PRN Medications: sodium chloride, acetaminophen, albuterol, morphine injection, ondansetron (ZOFRAN) IV, oxyCODONE, sodium chloride flush   Patient Profile  Jonathan Wood is a 52 year old with a history of HFrEF, NICM, ETOH abuse, HTN, smoker, anemia, chronic anticoagulation, and HMIII LVAD implanted 2019. 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    Admitted observation status  after driveline  debridement.  Assessment/Plan:   1. 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 He had been on suppressive antibiotics with keflex. 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  Followed by ID. New species identified--> 01/07/23 pseudomonas aeruginosa.  He was seen in the VAD clinic and set up for debridement.  Admitted observation after driveline debridement 8/29. WBC 5  Continue IV Cefepime. Discussed with pharmacy.  - wound cultures w/ rare WBC  - ID to see today - Will need pain controlled prior to d/c.    2. Chronic Biventricular HF, HMIII LVAD Plan to continue entresto 97-103 twice a day, sildenafil 20 mg three times a day, and Spironolactone 12.5 mg daily  Follow VAD Parameters.  INR 1.6.  Coumadin dosing per PharmD LDH stable Volume status stable.     3. HTN - MAPS 90 Continue home HTN meds. He has not been taking cardura.   4. H/O GI bleed Continue PPI  No ASA   5. Tobacco Abuse  Plan for discharge tomorrow.   I reviewed the LVAD parameters from today, and compared the results to the patient's prior recorded data.  No programming changes were made.  The LVAD is functioning within specified parameters.  The patient performs LVAD self-test daily.  LVAD interrogation was negative for any significant power changes, alarms or PI events/speed drops.  LVAD equipment check completed and is in good working order.  Back-up equipment present.   LVAD education done on emergency procedures and precautions and reviewed exit site care.  Length of Stay: 1  Alen Bleacher, NP 01/14/2023, 7:59 AM  VAD Team --- VAD ISSUES ONLY--- Pager [phone] (7am - 7am)  Advanced Heart Failure Team  Pager [phone] (M-F; 7a - 5p)  Please contact [facility] for night-coverage after hours (5p -7a ) and weekends on [email or web address]  Patient seen with NP, agree with the above note.   No complaints today, feels good overall.   Remains  on IV cefepime.   General: Well appearing this am. NAD.  HEENT: Normal. Neck: Supple, JVP 7-8 cm. Carotids OK.  Cardiac:  Mechanical heart sounds with LVAD hum present.  Lungs:  CTAB, normal effort.  Abdomen:  NT, ND, no HSM. No bruits or masses. +BS  LVAD exit site: Site is dressed Extremities:  Warm and dry. No cyanosis, clubbing, rash, or edema.  Neuro:  Alert & oriented x 3. Cranial nerves grossly intact. Moves all 4 extremities w/o difficulty. Affect pleasant    Frequent PI events, otherwise stable LVAD parameters.    INR 1.6, increasing warfarin tonight.   On cefepime for Pseudomonas driveline infection, we are waiting for ID to let us know the long-term home antibiotic regimen.  He should be able to go home tomorrow.   Marca Ancona 01/14/2023 3:29 PM

## 2023-01-14 NOTE — Progress Notes (Signed)
1 Day Post-Op Procedure(s) (LRB): VAD TUNNEL WOUND DEBRIDEMENT (N/A) Subjective: Doing well after VAD distal tunnel debridement Wound repacked with VASHE wet/dry today Cont iv Maxepime while in hospital Objective: Vital signs in last 24 hours: Temp:  [97.6 F (36.4 C)-98 F (36.7 C)] 97.6 F (36.4 C) (08/30 1117) Pulse Rate:  [64-97] 77 (08/30 1117) Cardiac Rhythm: Normal sinus rhythm (08/30 0700) Resp:  [12-20] 20 (08/30 1117) BP: (95-146)/(63-102) 95/63 (08/30 1117) SpO2:  [97 %-99 %] 98 % (08/30 1117) Weight:  [93.3 kg-94.4 kg] 94.4 kg (08/30 0412)  Hemodynamic parameters for last 24 hours:  nsr  Intake/Output from previous day: 08/29 0701 - 08/30 0700 In: 1263.9 [I.V.:963.9; IV Piggyback:300] Out: -  Intake/Output this shift: Total I/O In: 240 [P.O.:240] Out: -   Alert , no complaints Wound clean minimal granulation tissue normalmVAD hum  Lab Results: Recent Labs    01/13/23 1054 01/14/23 0245  WBC 3.7* 5.0  HGB 10.0* 9.3*  HCT 31.5* 29.3*  PLT 171 151   BMET:  Recent Labs    01/13/23 1054 01/14/23 0245  NA 135 138  K 3.9 3.8  CL 109 107  CO2 19* 22  GLUCOSE 96 156*  BUN 11 13  CREATININE 1.11 0.95  CALCIUM 8.4* 8.3*    PT/INR:  Recent Labs    01/14/23 0245  LABPROT 19.5*  INR 1.6*   ABG    Component Value Date/Time   PHART 7.278 (L) 12/27/2019 2043   HCO3 12.0 (L) 12/27/2019 2043   TCO2 13 (L) 12/27/2019 2043   ACIDBASEDEF 13.0 (H) 12/27/2019 2043   O2SAT 75.8 01/06/2020 0402   CBG (last 3)  No results for input(s): "GLUCAP" in the last 72 hours.  Assessment/Plan: S/P Procedure(s) (LRB): VAD TUNNEL WOUND DEBRIDEMENT (N/A) Cont coumadin per pharm D Daily Vashe dressing chg  LOS: 1 day    Lovett Sox 01/14/2023

## 2023-01-14 NOTE — Consult Note (Signed)
[facility]    Date of Admission:  01/13/2023     Total days of antibiotics 2               Reason for Consult: LVAD driveline infection   Referring Provider: Dr. Marca Ancona Primary Care Provider: [name] Bevelyn Buckles, MD   ASSESSMENT:  Jonathan Wood is a 52 y/o AA male s/p LVAD placement in 2019 with history of MSSA infection on suppressive cephalexin presenting with new driveline tenderness and drainage and found to have Pseudomonas drive line infection. Surgical specimens are without organisms on gram stain and cultures pending. Continue with current dose of cefepime which will cover MSSA and Pseudomonas and will monitor cultures for any new organisms and adjust antibiotics as indicated. Post-operative wound care per Dr. Maren Beach with remaining medical and supportive care per Primary Team.   PLAN:  Conftinue current dose of cefepime. Post-operative wound care per Dr. Maren Beach.  Monitor cultures for organisms and adjust antibiotics as appropriate.  Remaining medical and supportive care per Primary Team.   I have personally spent 28 minutes involved in face-to-face and non-face-to-face activities for this patient on the day of the visit. Professional time spent includes the following activities: Preparing to see the patient (review of tests), Obtaining and/or reviewing separately obtained history (admission/discharge record), Performing a medically appropriate examination and/or evaluation , Ordering medications/tests/procedures, referring and communicating with other health care professionals, Documenting clinical information in the EMR, Independently interpreting results (not separately reported), Communicating results to the patient/family/caregiver, Counseling and educating the patient/family/caregiver and Care coordination (not separately reported).     Principal Problem:   Complication involving left ventricular assist device (LVAD) Active  Problems:   Pseudomonas infection    gabapentin  900 mg Oral TID   pantoprazole  40 mg Oral BID   sacubitril-valsartan  1 tablet Oral BID   sildenafil  20 mg Oral TID   sodium chloride flush  3 mL Intravenous Q12H   spironolactone  12.5 mg Oral Daily   [START ON 01/15/2023] warfarin  5 mg Oral q1600   Warfarin - Physician Dosing Inpatient   Does not apply q1600     HPI: Jonathan Wood is a 52 y.o. male with previous medical history of HFrEF and NICM s/p HMIII LVAD placement in April 2019 complicated by MSSA infection on suppressive cephalexin, hypertension, and tobacco use admitted with driveline site pain and drainage.   Jonathan Wood notified the LVAD Coordinator on 01/05/23 regarding tenderness at his driveline sight with increase moisture. Was started on doxycycline to add MRSA coverage. Upon office follow up noted to have moderate amount of brown drainage with foul odor. Cultures taken grew pan sensitive Pseudomonas aeruginosa. Doxycycline was stopped and started on levofloxacin and scheduled for debridement on 01/13/23 with Dr. Maren Beach. Cephalexin was continued for suppression of previous MSSA infection.  Jonathan Wood has been afebrile since admission with no leukocytosis. OR on 8/29 and deep cultures obtained. There were no pockets of pus or significant bleeding. Surgical specimens with no organisms on gram stain and culture re-incubated for better growth. Antibiotics have been changed to cefepime. Feeling good with appropriate post-surgical discomfort.    Review of Systems: Review of Systems  Constitutional:  Negative for chills, fever and weight loss.  Respiratory:  Negative for cough, shortness of breath and wheezing.   Cardiovascular:  Negative for chest pain and leg swelling.  Gastrointestinal:  Negative for abdominal pain, constipation, diarrhea, nausea and vomiting.  Skin:  Negative for rash.     Past Medical History:  Diagnosis Date   Asthma    CHF (congestive  heart failure) (HCC)    a. 09/2016: EF 20-25% with cath showing normal cors   GERD (gastroesophageal reflux disease)    History of hiatal hernia    LVAD (left ventricular assist device) present (HCC)    OSA on CPAP 09/06/2018   Severe OSA with AHI 68/hr on CPAP at 12cm H2O    Social History   Tobacco Use   Smoking status: Former    Current packs/day: 0.50    Average packs/day: 0.5 packs/day for 25.0 years (12.5 ttl pk-yrs)    Types: Cigarettes   Smokeless tobacco: Never   Tobacco comments:    quit 08-16-2019  Vaping Use   Vaping status: Some Days  Substance Use Topics   Alcohol use: Not Currently    Comment: stopped drinking in April 2021   Drug use: Not Currently    Types: Marijuana    Family History  Problem Relation Age of Onset   Hypertension Father    Alcohol abuse Cousin    Colon cancer Neg Hx    Esophageal cancer Neg Hx    Inflammatory bowel disease Neg Hx    Liver disease Neg Hx    Pancreatic cancer Neg Hx    Rectal cancer Neg Hx    Stomach cancer Neg Hx    Diabetes Neg Hx     No Known Allergies  OBJECTIVE: Blood pressure 111/81, pulse 68, temperature 97.6 F (36.4 C), temperature source Oral, resp. rate 19, height 6\' 2"  (1.88 m), weight 94.4 kg, SpO2 99%.  Physical Exam Constitutional:      General: He is not in acute distress.    Appearance: He is well-developed.  Cardiovascular:     Rate and Rhythm: Normal rate and regular rhythm.     Comments: LVAD hum; driveline dressing in place; clean/dry Pulmonary:     Effort: Pulmonary effort is normal.     Breath sounds: Normal breath sounds.  Skin:    General: Skin is warm and dry.  Neurological:     Mental Status: He is alert and oriented to person, place, and time.  Psychiatric:        Mood and Affect: Mood normal.     Lab Results Lab Results  Component Value Date   WBC 5.0 01/14/2023   HGB 9.3 (L) 01/14/2023   HCT 29.3 (L) 01/14/2023   MCV 86.9 01/14/2023   PLT 151 01/14/2023    Lab  Results  Component Value Date   CREATININE 0.95 01/14/2023   BUN 13 01/14/2023   NA 138 01/14/2023   K 3.8 01/14/2023   CL 107 01/14/2023   CO2 22 01/14/2023    Lab Results  Component Value Date   ALT 14 01/13/2023   AST 18 01/13/2023   ALKPHOS 60 01/13/2023   BILITOT 1.2 01/13/2023     Microbiology: Recent Results (from the past 240 hour(s))  Aerobic Culture w Gram Stain (superficial specimen)     Status: None   Collection Time: 01/07/23 10:56 AM   Specimen: Wound  Result Value Ref Range Status   Specimen Description WOUND  Final   Special Requests left ventri assist device  Final   Gram Stain   Final    NO WBC SEEN NO ORGANISMS SEEN Performed at [facility], [address], [place], [place] [address]    Culture FEW PSEUDOMONAS AERUGINOSA  Final   Report Status 01/09/2023 FINAL  Final   Organism ID, Bacteria PSEUDOMONAS AERUGINOSA  Final      Susceptibility   Pseudomonas aeruginosa - MIC*    CEFTAZIDIME 4 SENSITIVE Sensitive     CIPROFLOXACIN <=0.25 SENSITIVE Sensitive     GENTAMICIN <=1 SENSITIVE Sensitive     IMIPENEM 2 SENSITIVE Sensitive     PIP/TAZO 16 SENSITIVE Sensitive     CEFEPIME 2 SENSITIVE Sensitive     * FEW PSEUDOMONAS AERUGINOSA  Surgical pcr screen     Status: None   Collection Time: 01/13/23 11:39 AM   Specimen: Nasal Mucosa; Nasal Swab  Result Value Ref Range Status   MRSA, PCR NEGATIVE NEGATIVE Final   Staphylococcus aureus NEGATIVE NEGATIVE Final    Comment: (NOTE) The Xpert SA Assay (FDA approved for NASAL specimens in patients 77 years of age and older), is one component of a comprehensive surveillance program. It is not intended to diagnose infection nor to guide or monitor treatment. Performed at [name], [address] [place], [place] [address]   Aerobic/Anaerobic Culture w Gram Stain (surgical/deep wound)     Status: None (Preliminary result)   Collection Time: 01/13/23  2:14 PM   Specimen: Path Tissue  Result  Value Ref Range Status   Specimen Description WOUND  Final   Special Requests vad abdominal wd  Final   Gram Stain   Final    RARE WBC PRESENT, PREDOMINANTLY PMN NO ORGANISMS SEEN    Culture   Final    CULTURE REINCUBATED FOR BETTER GROWTH Performed at [facility], [address] [place], [place] [address]    Report Status PENDING  Incomplete     Marcos Eke, NP [facility]  01/14/2023  9:19 AM

## 2023-01-14 NOTE — Progress Notes (Signed)
Warfarin dosing discussed with PharmD and MD. Will do 10mg  this evening to boost INR for discharge tomorrow.   Brynda Peon, AGACNP-BC  Advanced Heart Failure Team

## 2023-01-14 NOTE — Discharge Summary (Signed)
Advanced Heart Failure Team  Discharge Summary   Patient ID: RAMAL MCKETHAN MRN: [id number], DOB/AGE: 1970/12/11 52 y.o. Admit date: 01/13/2023 D/C date:     01/15/2023   Primary Discharge Diagnoses:  HMIII LVAD complication-> driveline infection  Secondary Discharge Diagnoses:  Chronic biventricular HF, s/p HMIII LVAD HTN H/o GIB Tobacco abuse  Hospital Course:  Mr Verdugo is a 52 year old with a history of HFrEF, NICM, ETOH abuse, HTN, smoker, anemia, chronic anticoagulation, and HMIII LVAD implanted 2019. 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    Mr. Montanez had been on chronic suppressive antibiotics for MRSA infection. Recent wound cultures grew pseudomonas. Doxycycline switched to cirpo by ID. Dr. Maren Beach later saw patient in VAD clinic and surgical debridement was recommended. He was admitted 8/29 for debridement and observation after. Wound packed with VASHE wet/dry dressing. Completed 3 days of IV cefepime while admitted. Discussed with ID. Plan to continue Cipro 750 mg BID at discharge (in addition to continuing Keflex for previous MSSA infection) with close f/u of INR (followed by long-term suppression). Warfarin dosing decreased at d/c. Will need f/u on Tuesday  On day of discharge was doing well. Afebrile. Pain well controlled   LVAD Interrogation HM II:   Speed: 6500    Flow:  6.2    PI: 2.6     Power:  5.7       Discharge Weight Range: 90 Discharge Vitals: Blood pressure (!) 116/102, pulse 60, temperature 98.4 F (36.9 C), temperature source Oral, resp. rate 20, height 6\' 2"  (1.88 m), weight 94.3 kg, SpO2 95%.  Labs: Lab Results  Component Value Date   WBC 4.4 01/15/2023   HGB 10.3 (L) 01/15/2023   HCT 32.8 (L) 01/15/2023   MCV 88.4 01/15/2023   PLT 165 01/15/2023    Recent Labs  Lab 01/13/23 1054 01/14/23 0245 01/15/23 0239  NA 135   < > 135  K 3.9   < > 3.9  CL 109   < > 105  CO2 19*   < >  23  BUN 11   < > 13  CREATININE 1.11   < > 1.00  CALCIUM 8.4*   < > 8.4*  PROT 6.7  --   --   BILITOT 1.2  --   --   ALKPHOS 60  --   --   ALT 14  --   --   AST 18  --   --   GLUCOSE 96   < > 119*   < > = values in this interval not displayed.   Lab Results  Component Value Date   CHOL 168 12/19/2020   HDL 48 12/19/2020   LDLCALC 106 (H) 12/19/2020   TRIG 88 08/11/2022   BNP (last 3 results) No results for input(s): "BNP" in the last 8760 hours.  ProBNP (last 3 results) No results for input(s): "PROBNP" in the last 8760 hours.   Diagnostic Studies/Procedures   No results found.  Discharge Medications   Allergies as of 01/15/2023   No Known Allergies      Medication List     STOP taking these medications    ibuprofen 200 MG tablet Commonly known as: ADVIL       TAKE these medications    cephALEXin 500 MG capsule Commonly known as: KEFLEX Take 1 capsule (500 mg total) by mouth 3 (three) times daily.   ciprofloxacin 750 MG tablet Commonly known as:  CIPRO Take 1 tablet (750 mg total) by mouth 2 (two) times daily.   doxazosin 2 MG tablet Commonly known as: CARDURA Take 0.5 tablets (1 mg total) by mouth at bedtime.   Entresto 97-103 MG Generic drug: sacubitril-valsartan TAKE 1 TABLET BY MOUTH TWICE DAILY   gabapentin 300 MG capsule Commonly known as: NEURONTIN TAKE ONE CAPSULE BY MOUTH THREE TIMES DAILY AT 9am, 3pm, AND 9pm What changed: See the new instructions.   pantoprazole 40 MG tablet Commonly known as: PROTONIX Take 1 tablet (40 mg total) by mouth 2 (two) times daily.   sildenafil 20 MG tablet Commonly known as: REVATIO Take 1 tablet (20 mg total) by mouth 3 (three) times daily.   spironolactone 25 MG tablet Commonly known as: ALDACTONE Take 0.5 tablets (12.5 mg total) by mouth daily.   warfarin 5 MG tablet Commonly known as: COUMADIN Take as directed. If you are unsure how to take this medication, talk to your nurse or  doctor. Original instructions: Take 1.5 tablets (7.5 mg total) by mouth daily at 4 PM. 7.5 mg (1.5 tab) Monday and Friday, 5 mg (1 tablet) all other days OR as directed by HF clinic What changed: additional instructions        Disposition   The patient will be discharged in stable condition to home.   Follow-up Information     [facility]. Go in 27 day(s).   Why: Follow up hospital appointment scheduled for Friday, February 11, 2023 at 2:00 PM.  PLEASE ARRIVE 10 minutes early.  Please BRING id, copy of insurance card, and list of current medication. Contact information: [address] [place] [address] [phone]                  Duration of Discharge Encounter: Greater than 35 minutes   Signed,  Arvilla Meres, MD  12:19 PM  01/15/2023, 12:19 PM

## 2023-01-14 NOTE — TOC Progression Note (Signed)
Transition of Care [facility]) - Progression Note    Patient Details  Name: Jonathan Wood MRN: [id number] Date of Birth: 10/09/1970  [facility] of [facility]) CM/SW Contact  Elliot Cousin, RN Phone Number: [phone] 01/14/2023, 4:16 PM  Clinical Narrative:  CM spoke to pt at bedside. Plan to dc home on po abx. Pt reports he has scale at home. Girlfriend will provide transportation home.      Expected Discharge Plan: Home/Self Care Barriers to Discharge: No Barriers Identified  Expected Discharge Plan and Services In-house Referral: Clinical Social Work Discharge Planning Services: CM Consult   Living arrangements for the past 2 months: Single Family Home                                       Social Determinants of Health (SDOH) Interventions SDOH Screenings   Food Insecurity: No Food Insecurity (01/14/2023)  Housing: Low Risk  (01/14/2023)  Transportation Needs: No Transportation Needs (01/14/2023)  Utilities: Not At Risk (01/14/2023)  Alcohol Screen: Low Risk  (01/14/2023)  Depression (PHQ2-9): Medium Risk (09/25/2019)  Financial Resource Strain: Low Risk  (01/14/2023)  Physical Activity: Sufficiently Active (01/14/2023)  Stress: No Stress Concern Present (01/14/2023)  Tobacco Use: Medium Risk (01/13/2023)    Readmission Risk Interventions     No data to display

## 2023-01-15 ENCOUNTER — Other Ambulatory Visit (HOSPITAL_COMMUNITY): Payer: Self-pay

## 2023-01-15 DIAGNOSIS — T827XXA Infection and inflammatory reaction due to other cardiac and vascular devices, implants and grafts, initial encounter: Secondary | ICD-10-CM

## 2023-01-15 DIAGNOSIS — B965 Pseudomonas (aeruginosa) (mallei) (pseudomallei) as the cause of diseases classified elsewhere: Secondary | ICD-10-CM | POA: Diagnosis not present

## 2023-01-15 LAB — BASIC METABOLIC PANEL
Anion gap: 7 (ref 5–15)
BUN: 13 mg/dL (ref 6–20)
CO2: 23 mmol/L (ref 22–32)
Calcium: 8.4 mg/dL — ABNORMAL LOW (ref 8.9–10.3)
Chloride: 105 mmol/L (ref 98–111)
Creatinine, Ser: 1 mg/dL (ref 0.61–1.24)
GFR, Estimated: >60 mL/min (ref >60)
Glucose, Bld: 119 mg/dL — ABNORMAL HIGH (ref 70–99)
Potassium: 3.9 mmol/L (ref 3.5–5.1)
Sodium: 135 mmol/L (ref 135–145)

## 2023-01-15 LAB — CBC
HCT: 32.8 % — ABNORMAL LOW (ref 39.0–52.0)
Hemoglobin: 10.3 g/dL — ABNORMAL LOW (ref 13.0–17.0)
MCH: 27.8 pg (ref 26.0–34.0)
MCHC: 31.4 g/dL (ref 30.0–36.0)
MCV: 88.4 fL (ref 80.0–100.0)
Platelets: 165 10*3/uL (ref 150–400)
RBC: 3.71 MIL/uL — ABNORMAL LOW (ref 4.22–5.81)
RDW: 12.9 % (ref 11.5–15.5)
WBC: 4.4 10*3/uL (ref 4.0–10.5)
nRBC: 0 % (ref 0.0–0.2)

## 2023-01-15 LAB — LACTATE DEHYDROGENASE: LDH: 157 U/L (ref 98–192)

## 2023-01-15 LAB — SEDIMENTATION RATE: Sed Rate: 15 mm/hr (ref 0–16)

## 2023-01-15 LAB — C-REACTIVE PROTEIN: CRP: 2.2 mg/dL — ABNORMAL HIGH (ref <1.0)

## 2023-01-15 LAB — PROTIME-INR
INR: 1.6 — ABNORMAL HIGH (ref 0.8–1.2)
Prothrombin Time: 18.9 s — ABNORMAL HIGH (ref 11.4–15.2)

## 2023-01-15 MED ORDER — WARFARIN SODIUM 5 MG PO TABS
7.5000 mg | ORAL_TABLET | Freq: Every day | ORAL | 5 refills | Status: DC
  Filled 2023-01-15: qty 40, 26d supply, fill #0

## 2023-01-15 MED ORDER — CIPROFLOXACIN HCL 750 MG PO TABS
750.0000 mg | ORAL_TABLET | Freq: Two times a day (BID) | ORAL | 0 refills | Status: DC
Start: 2023-01-15 — End: 2023-02-10
  Filled 2023-01-15: qty 60, 30d supply, fill #0

## 2023-01-15 MED ORDER — WARFARIN SODIUM 7.5 MG PO TABS: 7.5000 mg | ORAL_TABLET | Freq: Once | ORAL | Status: DC

## 2023-01-15 NOTE — Progress Notes (Addendum)
ANTICOAGULATION CONSULT NOTE  Pharmacy Consult for warfarin Indication:  LVAD  No Known Allergies  Patient Measurements: Height: 6\' 2"  (188 cm) Weight: 94.3 kg (207 lb 14.3 oz) (scale A) IBW/kg (Calculated) : 82.2  Vital Signs:    Labs: Recent Labs    01/13/23 1054 01/13/23 1807 01/14/23 0245 01/15/23 0239  HGB 10.0*  --  9.3* 10.3*  HCT 31.5*  --  29.3* 32.8*  PLT 171  --  151 165  APTT 38*  --   --   --   LABPROT 21.2* 21.8* 19.5* 18.9*  INR 1.8* 1.9* 1.6* 1.6*  CREATININE 1.11  --  0.95 1.00    Estimated Creatinine Clearance: 103.9 mL/min (by C-G formula based on SCr of 1 mg/dL).   Medical History: Past Medical History:  Diagnosis Date   Asthma    CHF (congestive heart failure) (HCC)    a. 09/2016: EF 20-25% with cath showing normal cors   GERD (gastroesophageal reflux disease)    History of hiatal hernia    LVAD (left ventricular assist device) present (HCC)    OSA on CPAP 09/06/2018   Severe OSA with AHI 68/hr on CPAP at 12cm H2O    Assessment: 49 yoM with LVAD admitted with DLI. Warfarin held preop, debridement done 8/29.  INR 1.6 today, unchanged.  LDH ok, CBC stable.  Received 15 mg of warfarin yesterday (5 mg in AM per MD dosing, 10 mg PM per PharmD).   Home warfarin dose is 10mg  Mon/Fri, 7.5mg  AODs  Goal of Therapy:  INR 2-2.5 Monitor platelets by anticoagulation protocol: Yes   Plan:  Warfarin 7.5 mg x1 tonight  Daily INR - if drops further may need low dose heparin  Update:  Discharging on ciprofloxacin 750 mg BID.  Will adjust warfarin regimen to 7.5 mg Monday/Friday and 5 mg AOD (~30% reduction).  Recheck INR next week.  Trixie Rude, PharmD Clinical Pharmacist 01/15/2023  10:35 AM  Please check AMION for all [facility] Pharmacy phone numbers After 10:00 PM, call Main Pharmacy [phone]

## 2023-01-15 NOTE — Progress Notes (Addendum)
[facility]    Date of Admission:  01/13/2023   Total days of antibiotics 3   ID: Jonathan Wood is a 52 y.o. male with  hx of MSSA DLI, now admitted for PsA DLI. He was taking cephalexin TID sfor past few years, he started to notice increasing drainage from drive line site. LVAD clinic did wound cx plus empirically started on doxy plus cipro. He ultimately was admitted and underwent I xD on 8/29 for debridement of VAD power cord tunnel and pulse lavage washout of the wound using Vashe wound solution. Cx on 8/29 showing a few PsA- cultures reincubation. Principal Problem:   Complication involving left ventricular assist device (LVAD) Active Problems:   Pseudomonas infection    Subjective: Remains afebrile, no longer having pain at driveline site  Medications:   gabapentin  900 mg Oral TID   pantoprazole  40 mg Oral BID   sacubitril-valsartan  1 tablet Oral BID   sildenafil  20 mg Oral TID   sodium chloride flush  3 mL Intravenous Q12H   spironolactone  12.5 mg Oral Daily   warfarin  7.5 mg Oral ONCE-1600   Warfarin - Pharmacist Dosing Inpatient   Does not apply q1600    Objective: Vital signs in last 24 hours: Temp:  [97.6 F (36.4 C)-98.4 F (36.9 C)] 98.4 F (36.9 C) (08/30 2100) Pulse Rate:  [60] 60 (08/30 1555) Resp:  [20] 20 (08/30 1555) BP: (110-116)/(85-102) 116/102 (08/30 2100) SpO2:  [95 %] 95 % (08/30 1555) Weight:  [94.3 kg] 94.3 kg (08/31 0433)  Physical Exam  Constitutional: He is oriented to person, place, and time. He appears well-developed and well-nourished. No distress.  HENT:  Mouth/Throat: Oropharynx is clear and moist. No oropharyngeal exudate.  Cardiovascular: generator hum Pulmonary/Chest: Effort normal and breath sounds normal. No respiratory distress. He has no wheezes.  Abdominal: Soft. Bowel sounds are normal. He exhibits no distension. There is no tenderness. DL is covered. Lymphadenopathy:  He has no  cervical adenopathy.  Neurological: He is alert and oriented to person, place, and time.  Skin: Skin is warm and dry. No rash noted. No erythema.  Psychiatric: He has a normal mood and affect. His behavior is normal.    Lab Results Recent Labs    01/14/23 0245 01/15/23 0239  WBC 5.0 4.4  HGB 9.3* 10.3*  HCT 29.3* 32.8*  NA 138 135  K 3.8 3.9  CL 107 105  CO2 22 23  BUN 13 13  CREATININE 0.95 1.00   Liver Panel Recent Labs    01/13/23 1054  PROT 6.7  ALBUMIN 3.6  AST 18  ALT 14  ALKPHOS 60  BILITOT 1.2     Microbiology:  8/29: few PsA - isolation pending 8/23 Culture FEW PSEUDOMONAS AERUGINOSA  Report Status 01/09/2023 FINAL  Organism ID, Bacteria PSEUDOMONAS AERUGINOSA  Resulting Agency [facility]     Susceptibility   Pseudomonas aeruginosa    MIC    CEFEPIME 2 SENSITIVE Sensitive    CEFTAZIDIME 4 SENSITIVE Sensitive    CIPROFLOXACIN <=0.25 SENS... Sensitive    GENTAMICIN <=1 SENSITIVE Sensitive    IMIPENEM 2 SENSITIVE Sensitive    PIP/TAZO 16 SENSITIVE Sensitive        Studies/Results: No results found.   Assessment/Plan: Deep tissue wound/DLI = will plan for 6 wk of cipro 750mg  PO BID; then address need for chronic suppression. Continue with weekly LVAD wound check at clinic. We will coordinate  for [place] to see him in the ID clinic on sept 10th.  - will follow up on his most recent cx results should we need to change abtx -will check sed rate and crp  Hx of MSSA DLI  in 2021= continue on cephalexin 500mg  po TID  LVAD and anticoagulation = since cipro has significant interaction with warfarin, his INR will need to be monitored closely for dose adjustment.   I have personally spent 50 minutes involved in face-to-face and non-face-to-face activities for this patient on the day of the visit. Professional time spent includes the following activities: Preparing to see the patient (review of tests), Obtaining and/or reviewing separately obtained history  (admission/discharge record), Performing a medically appropriate examination and/or evaluation , Ordering medications/tests/procedures, referring and communicating with other health care professionals, Documenting clinical information in the EMR, Independently interpreting results (not separately reported), Communicating results to the patient/family/caregiver, Counseling and educating the patient/family/caregiver and Care coordination (not separately reported).     [facility] for Infectious Diseases Pager: [phone]  01/15/2023, 11:30 AM

## 2023-01-16 LAB — TYPE AND SCREEN
ABO/RH(D): B POS
Antibody Screen: POSITIVE
Donor AG Type: NEGATIVE
Donor AG Type: NEGATIVE
Unit division: 0
Unit division: 0

## 2023-01-16 LAB — BPAM RBC
Blood Product Expiration Date: 202409122359
Blood Product Expiration Date: 202409122359
Unit Type and Rh: 7300
Unit Type and Rh: 7300

## 2023-01-18 ENCOUNTER — Ambulatory Visit (HOSPITAL_COMMUNITY): Payer: Self-pay | Admitting: Pharmacist

## 2023-01-18 ENCOUNTER — Ambulatory Visit (HOSPITAL_COMMUNITY)
Admit: 2023-01-18 | Discharge: 2023-01-18 | Disposition: A | Discharge status: Home / Self Care | Payer: 59 | Attending: Cardiology | Admitting: Cardiology

## 2023-01-18 DIAGNOSIS — Z95811 Presence of heart assist device: Secondary | ICD-10-CM | POA: Insufficient documentation

## 2023-01-18 DIAGNOSIS — Z7901 Long term (current) use of anticoagulants: Secondary | ICD-10-CM | POA: Diagnosis not present

## 2023-01-18 LAB — PROTIME-INR
INR: 1.2 (ref 0.8–1.2)
Prothrombin Time: 15.4 s — ABNORMAL HIGH (ref 11.4–15.2)

## 2023-01-18 MED ORDER — ENOXAPARIN SODIUM 40 MG/0.4ML IJ SOSY: 40.0000 mg | PREFILLED_SYRINGE | Freq: Two times a day (BID) | INTRAMUSCULAR | 0 refills | Status: DC

## 2023-01-18 NOTE — Addendum Note (Signed)
Encounter addended by: Lovett Sox, MD on: 01/18/2023 11:34 AM  Actions taken: Clinical Note Signed

## 2023-01-18 NOTE — Progress Notes (Signed)
LVAD INR 

## 2023-01-18 NOTE — Addendum Note (Signed)
Encounter addended by: Flora Lipps, RN on: 01/18/2023 11:03 AM  Actions taken: Clinical Note Signed

## 2023-01-18 NOTE — Progress Notes (Addendum)
Pt presents to VAD Clinic for dressing change and INR. Pt recently discharged for drive line infection. Cultures positive for pseudomonas. Completed 3 days of IV Cefepime. Pt remains on Cipro 750mg  BID and Kelfex for previous MSSA infection. States he is taking his antibiotic as prescribed. ID f/u 9/5 at 0900.   Drive Line: Existing VAD dressing removed and site care performed using sterile technique by Dr. Maren Beach. Wound bed cleansed with VASHE solution and sterile 4x4 gauze through light manual debridement. Skin surrounding wound bed cleaned with Chlora prep applicators x 2, allowed to dry. VASHE moistened 4 x 4 placed in wound bed, covered with several dry 4 x 4s. Silver nitrate x 3 used for skin bleeder with hemostasis. Drive line unincorporated. Moderate amount of serosanguinous drainage noted. No redness, tenderness, foul odor or rash noted. Drive line anchor secured. Pt given 14 days kits and asked to bring to clinic each visit for dressing change. Next dressing change due 01/20/23.       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  The patient has wound cultures positive for Pseudomonas.  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 Patient is taking oral Cipro and Keflex and performing wet-to-dry Vashe wound dressings daily.  On exam the wound shows some improvement.  Is approximately 80 to 90% clean granulation tissue.  The wound was cleaned and then packed with Vashe wet-to-dry and a single 4 x 4 gauze.  The patient will return in 2 days to the VAD clinic for another sterile Vashe wet-to-dry dressing change.  Exam Alert and  comfortable Normal sinus rhythm Lungs clear Normal VAD hum No abdominal wall tenderness proximal to the exit site wound  Plan Continue oral antibiotics Continue daily wet-to-dry Vashe dressing changes as possible with VAD clinic visits at least twice a week.  P Donata Clay MD

## 2023-01-19 LAB — AEROBIC/ANAEROBIC CULTURE W GRAM STAIN (SURGICAL/DEEP WOUND)

## 2023-01-20 ENCOUNTER — Ambulatory Visit (HOSPITAL_COMMUNITY)
Admission: RE | Admit: 2023-01-20 | Discharge: 2023-01-20 | Disposition: A | Discharge status: Home / Self Care | Payer: 59 | Source: Ambulatory Visit | Attending: Cardiology | Admitting: Cardiology

## 2023-01-20 ENCOUNTER — Inpatient Hospital Stay: Payer: 59 | Admitting: Internal Medicine

## 2023-01-20 ENCOUNTER — Other Ambulatory Visit (HOSPITAL_COMMUNITY): Payer: Self-pay | Admitting: *Deleted

## 2023-01-20 DIAGNOSIS — Z4801 Encounter for change or removal of surgical wound dressing: Secondary | ICD-10-CM | POA: Diagnosis present

## 2023-01-20 DIAGNOSIS — Z7901 Long term (current) use of anticoagulants: Secondary | ICD-10-CM

## 2023-01-20 DIAGNOSIS — T827XXA Infection and inflammatory reaction due to other cardiac and vascular devices, implants and grafts, initial encounter: Secondary | ICD-10-CM

## 2023-01-20 DIAGNOSIS — Z95811 Presence of heart assist device: Secondary | ICD-10-CM

## 2023-01-20 LAB — SURGICAL PATHOLOGY

## 2023-01-20 NOTE — Progress Notes (Signed)
Pt presents to [facility] for dressing change and INR. Pt recently discharged for drive line infection. Cultures positive for pseudomonas. Completed 3 days of IV Cefepime. Pt remains on Cipro 750mg  BID and Kelfex for previous MSSA infection. States he is taking his antibiotic as prescribed. ID f/u 9/12 at 0930.   Drive Line: Existing VAD dressing removed and site care performed using sterile technique. Wound bed cleansed with VASHE solution and sterile 4x4 gauze through light manual debridement. Skin surrounding wound bed cleaned with Chlora prep applicators x 2, allowed to dry. VASHE moistened 4 x 4 placed in wound bed, covered with several dry 4 x 4s. Drive line unincorporated. Moderate amount of serosanguinous drainage noted. No redness, tenderness, foul odor or rash noted. Drive line anchor secured. Pt given 14 days kits and asked to bring to clinic each visit for dressing change. Next dressing change due 01/21/23.        Plan: Return to [facility] tomorrow at 10:00am    Carlton Adam RN, BSN V[facility] Coordinator 24/7 Pager [phone]

## 2023-01-21 ENCOUNTER — Ambulatory Visit (HOSPITAL_COMMUNITY)
Admission: RE | Admit: 2023-01-21 | Discharge: 2023-01-21 | Disposition: A | Discharge status: Home / Self Care | Payer: 59 | Source: Ambulatory Visit | Attending: Internal Medicine | Admitting: Internal Medicine

## 2023-01-21 DIAGNOSIS — Z7901 Long term (current) use of anticoagulants: Secondary | ICD-10-CM | POA: Insufficient documentation

## 2023-01-21 DIAGNOSIS — Z48812 Encounter for surgical aftercare following surgery on the circulatory system: Secondary | ICD-10-CM | POA: Insufficient documentation

## 2023-01-21 DIAGNOSIS — Z95811 Presence of heart assist device: Secondary | ICD-10-CM | POA: Insufficient documentation

## 2023-01-21 NOTE — Progress Notes (Signed)
Pt presents to [facility] for dressing change. Pt recently discharged for drive line infection. Cultures positive for pseudomonas. Completed 3 days of IV Cefepime. Pt remains on Cipro 750mg  BID and Kelfex for previous MSSA infection. States he is taking his antibiotic as prescribed. ID f/u 9/12 at 0930.   Drive Line: Existing VAD dressing removed and site care performed using sterile technique. Wound bed cleansed with VASHE solution and sterile 4x4 gauze through light manual debridement. Skin surrounding wound bed cleaned with Chlora prep applicators x 2, allowed to dry. VASHE moistened 4 x 4 placed in wound bed, covered with extra dry 4 x 4s for the weekend. Drive line unincorporated. Moderate amount of serosanguinous drainage noted. No redness, tenderness, foul odor or rash noted. Drive line anchor secured. Pt has sufficient kits at home. Next dressing change due 01/24/23.      Plan: Return to [facility] Monday morning at 0930am for dressing change and INR   Carlton Adam RN, BSN [facility] Coordinator 24/7 Pager [phone]

## 2023-01-24 ENCOUNTER — Ambulatory Visit (HOSPITAL_COMMUNITY): Payer: Self-pay | Admitting: Pharmacist

## 2023-01-24 ENCOUNTER — Ambulatory Visit (HOSPITAL_COMMUNITY)
Admission: RE | Admit: 2023-01-24 | Discharge: 2023-01-24 | Disposition: A | Discharge status: Home / Self Care | Payer: 59 | Source: Ambulatory Visit | Attending: Internal Medicine | Admitting: Internal Medicine

## 2023-01-24 ENCOUNTER — Telehealth (HOSPITAL_COMMUNITY): Payer: Self-pay | Admitting: Unknown Physician Specialty

## 2023-01-24 DIAGNOSIS — Z4801 Encounter for change or removal of surgical wound dressing: Secondary | ICD-10-CM | POA: Diagnosis not present

## 2023-01-24 DIAGNOSIS — Z7901 Long term (current) use of anticoagulants: Secondary | ICD-10-CM | POA: Diagnosis not present

## 2023-01-24 DIAGNOSIS — Z95811 Presence of heart assist device: Secondary | ICD-10-CM | POA: Diagnosis present

## 2023-01-24 LAB — PROTIME-INR
INR: 2.3 — ABNORMAL HIGH (ref 0.8–1.2)
Prothrombin Time: 25.8 s — ABNORMAL HIGH (ref 11.4–15.2)

## 2023-01-24 NOTE — Progress Notes (Addendum)
Pt presents to [facility] for dressing change. Pt recently discharged for drive line infection. Cultures positive for pseudomonas. Completed 3 days of IV Cefepime. Pt remains on Cipro 750mg  BID and Kelfex for previous MSSA infection. States he is taking his antibiotic as prescribed. ID f/u 9/12 at 0930.   Drive Line: Existing VAD dressing removed and site care performed using sterile technique. Wound bed cleansed with VASHE solution and sterile 4x4 gauze through light manual debridement. Skin surrounding wound bed cleaned with Chlora prep applicators x 2, allowed to dry. VASHE moistened 4 x 4 placed in wound bed, covered with extra dry 4 x 4s. Drive line unincorporated. Moderate amount of serosanguinous drainage noted. No redness, tenderness, foul odor or rash noted. Drive line anchor secured. Covered entire dressing with large tegaderms. Pt has sufficient kits at home. Will change dressing Monday/Wednesday/Friday in VAD clinic per Dr Donata Clay. Next dressing change due 01/26/23.       Plan: Return to [facility] Wednesday morning at 0930am for dressing change  Coumadin dosing per Lauren PharmD   Alyce Pagan RN [facility] Coordinator  Office: [phone]  24/7 Pager: [phone]

## 2023-01-24 NOTE — Telephone Encounter (Signed)
Received call from pt stating that he had a nosebleed today after leaving. Pt states "why did this happen?" Pts INR is 2.3. pt was instructed to hold pressure for at least 30 minutes, use ice and do not blow his nose once bleeding stops. Pt informed if he cannot get the bleeding to stop to call the VAD office.  Carlton Adam RN, BSN [facility] Coordinator 24/7 Pager [phone]

## 2023-01-24 NOTE — Addendum Note (Signed)
Encounter addended by: Bernita Raisin, RN on: 01/24/2023 2:13 PM  Actions taken: Clinical Note Signed

## 2023-01-25 ENCOUNTER — Inpatient Hospital Stay: Payer: 59 | Admitting: Family

## 2023-01-26 ENCOUNTER — Ambulatory Visit (HOSPITAL_COMMUNITY)
Admission: RE | Admit: 2023-01-26 | Discharge: 2023-01-26 | Disposition: A | Discharge status: Home / Self Care | Payer: 59 | Source: Ambulatory Visit | Attending: Internal Medicine | Admitting: Internal Medicine

## 2023-01-26 DIAGNOSIS — B9561 Methicillin susceptible Staphylococcus aureus infection as the cause of diseases classified elsewhere: Secondary | ICD-10-CM | POA: Insufficient documentation

## 2023-01-26 DIAGNOSIS — Z4509 Encounter for adjustment and management of other cardiac device: Secondary | ICD-10-CM | POA: Insufficient documentation

## 2023-01-26 DIAGNOSIS — Z95811 Presence of heart assist device: Secondary | ICD-10-CM | POA: Diagnosis not present

## 2023-01-26 DIAGNOSIS — T827XXD Infection and inflammatory reaction due to other cardiac and vascular devices, implants and grafts, subsequent encounter: Secondary | ICD-10-CM | POA: Diagnosis not present

## 2023-01-26 DIAGNOSIS — Z7901 Long term (current) use of anticoagulants: Secondary | ICD-10-CM | POA: Diagnosis not present

## 2023-01-26 DIAGNOSIS — T827XXA Infection and inflammatory reaction due to other cardiac and vascular devices, implants and grafts, initial encounter: Secondary | ICD-10-CM

## 2023-01-26 DIAGNOSIS — B965 Pseudomonas (aeruginosa) (mallei) (pseudomallei) as the cause of diseases classified elsewhere: Secondary | ICD-10-CM | POA: Diagnosis not present

## 2023-01-26 NOTE — Progress Notes (Signed)
Pt presents to [facility] for dressing change. Pt recently discharged for drive line infection. Cultures positive for pseudomonas. Completed 3 days of IV Cefepime. Pt remains on Cipro 750mg  BID and Kelfex for previous MSSA infection. States he is taking his antibiotic as prescribed. ID f/u 9/12 at 0930.   Drive Line: Existing VAD dressing removed and site care performed using sterile technique. Wound bed cleansed with VASHE solution and sterile 4x4 gauze through light manual debridement. Skin surrounding wound bed cleaned with Chlora prep applicators x 2, allowed to dry. VASHE moistened 4 x 4 placed in wound bed, covered with extra dry 4 x 4s. Drive line unincorporated. Moderate amount of serosanguinous drainage noted. No redness, tenderness, foul odor or rash noted. Wound bed beefy red. Drive line anchor secured. Covered entire dressing with large tegaderms. Pt has sufficient kits at home. Will change dressing Monday/Wednesday/Friday in VAD clinic per Dr Donata Clay. Next dressing change due 01/28/23.       Plan: Return to [facility] Friday morning at 0930am for dressing change    Alyce Pagan RN [facility] Coordinator  Office: [phone]  24/7 Pager: [phone]

## 2023-01-27 ENCOUNTER — Inpatient Hospital Stay: Payer: 59 | Admitting: Family

## 2023-01-28 ENCOUNTER — Ambulatory Visit (HOSPITAL_COMMUNITY)
Admission: RE | Admit: 2023-01-28 | Discharge: 2023-01-28 | Disposition: A | Discharge status: Home / Self Care | Payer: 59 | Source: Ambulatory Visit | Attending: Internal Medicine | Admitting: Internal Medicine

## 2023-01-28 ENCOUNTER — Other Ambulatory Visit (HOSPITAL_COMMUNITY): Payer: Self-pay | Admitting: *Deleted

## 2023-01-28 DIAGNOSIS — Z7901 Long term (current) use of anticoagulants: Secondary | ICD-10-CM

## 2023-01-28 DIAGNOSIS — Z95811 Presence of heart assist device: Secondary | ICD-10-CM | POA: Insufficient documentation

## 2023-01-28 DIAGNOSIS — Z4801 Encounter for change or removal of surgical wound dressing: Secondary | ICD-10-CM | POA: Diagnosis present

## 2023-01-28 DIAGNOSIS — T827XXA Infection and inflammatory reaction due to other cardiac and vascular devices, implants and grafts, initial encounter: Secondary | ICD-10-CM

## 2023-01-28 NOTE — Addendum Note (Signed)
Encounter addended by: Lovett Sox, MD on: 01/28/2023 3:31 PM  Actions taken: Clinical Note Signed

## 2023-01-28 NOTE — Progress Notes (Addendum)
Pt presents to [facility] for dressing change. Pt recently discharged for drive line infection. Cultures positive for pseudomonas. Completed 3 days of IV Cefepime. Pt remains on Cipro 750mg  BID and Kelfex for previous MSSA infection. States he is taking his antibiotic as prescribed. ID f/u 9/12 at 0930.   Drive Line: Existing VAD dressing removed and site care performed using sterile technique. Wound bed cleansed with VASHE solution and sterile 4x4 gauze through light manual debridement. Skin surrounding wound bed cleaned with Chlora prep applicators x 2, allowed to dry. VASHE moistened 4 x 4 placed in wound bed, covered with extra dry 4 x 4s. Drive line unincorporated. Moderate amount of serosanguinous drainage noted. No redness, tenderness, foul odor or rash noted. Wound bed beefy red. Drive line anchor secured. Covered entire dressing with large tegaderms. Pt has sufficient kits at home. Will change dressing Monday/Wednesday/Friday in VAD clinic per Dr Donata Clay. Next dressing change due 01/31/23.       Plan: Return to [facility] Monday morning at 0930am for dressing change and INR   Alyce Pagan RN [facility] Coordinator  Office: [phone]  24/7 Pager: [phone]   CT Surgery  Patient examined, driveline exit site wound personally inspected and repacked with Vashe wet-to-dry 4 x 4 gauze.  The wound is 95% clean granulation tissue is starting to contract.  There is no purulence.  There is no fibrinous exudate to removed today.  The patient has been compliant with his dual antibiotic coverage.  Exam Alert and comfortable Lungs clear Normal VAD hum Abdomen nontender Neuro intact  VAT parameters checked and are satisfactory  Plan-continue power power cord wound dressing changes Monday Wednesday Friday at the [facility] clinic.  Lovett Sox MD

## 2023-01-31 ENCOUNTER — Ambulatory Visit (HOSPITAL_COMMUNITY): Payer: Self-pay | Admitting: Pharmacist

## 2023-01-31 ENCOUNTER — Ambulatory Visit (HOSPITAL_COMMUNITY)
Admission: RE | Admit: 2023-01-31 | Discharge: 2023-01-31 | Disposition: A | Discharge status: Home / Self Care | Payer: 59 | Source: Ambulatory Visit | Attending: Cardiology | Admitting: Cardiology

## 2023-01-31 DIAGNOSIS — Z95811 Presence of heart assist device: Secondary | ICD-10-CM | POA: Diagnosis present

## 2023-01-31 DIAGNOSIS — Z7901 Long term (current) use of anticoagulants: Secondary | ICD-10-CM | POA: Diagnosis not present

## 2023-01-31 LAB — PROTIME-INR
INR: 2 — ABNORMAL HIGH (ref 0.8–1.2)
Prothrombin Time: 23 s — ABNORMAL HIGH (ref 11.4–15.2)

## 2023-01-31 NOTE — Progress Notes (Addendum)
Pt presents to [facility] for dressing change. Pt recently discharged for drive line infection. Cultures positive for pseudomonas. Completed 3 days of IV Cefepime. Pt remains on Cipro 750mg  BID and Kelfex for previous MSSA infection. States he is taking his antibiotic as prescribed. Pt no showed his ID f/u 9/12.   Drive Line: Existing VAD dressing removed and site care performed using sterile technique. Wound bed cleansed with VASHE solution and sterile 4x4 gauze through light manual debridement. Skin surrounding wound bed cleaned with Chlora prep applicators x 2, allowed to dry. 4 VASHE moistened 2 x 2s placed in wound bed, covered with extra dry 4 x 4s. Drive line partially incorporated. Moderate amount of serosanguinous drainage noted. No redness, tenderness, foul odor or rash noted. Wound bed beefy red. Drive line anchor secured. Covered entire dressing with large tegaderms. Pt has sufficient kits at home. Will change dressing Monday/Wednesday/Friday in V[facility] clinic per Dr Donata Clay. Next dressing change due 02/02/23.       Plan: Return to [facility] Wednesday morning at 0930am for dressing change    Alyce Pagan RN [facility] Coordinator  Office: [phone]  24/7 Pager: [phone]  CT surgery note   Patient examined, driveline exit site wound personally examined and sterile wet-to-dry packing with Vashe wound solution performed.  The patient's wound is 99% granulation tissue.  Some fibrinous exudate at the junction with the skin was removed.  The circular wound now measures 2.5 cm wide by 2 cm deep.  I placed four 2 x 2 gauze pads containing Vashe into the wound covered with a dry 4 x 4 gauze.  Wound looks extremely clean, is now contracting, and we have good momentum for resolving this infection.  Continue Monday Wednesday Friday dressing change at the [facility] clinic  Lovett Sox MD

## 2023-02-02 ENCOUNTER — Ambulatory Visit (HOSPITAL_COMMUNITY)
Admission: RE | Admit: 2023-02-02 | Discharge: 2023-02-02 | Disposition: A | Discharge status: Home / Self Care | Payer: 59 | Source: Ambulatory Visit | Attending: Cardiology | Admitting: Cardiology

## 2023-02-02 ENCOUNTER — Other Ambulatory Visit (HOSPITAL_COMMUNITY): Payer: Self-pay | Admitting: *Deleted

## 2023-02-02 DIAGNOSIS — Z4801 Encounter for change or removal of surgical wound dressing: Secondary | ICD-10-CM | POA: Diagnosis present

## 2023-02-02 DIAGNOSIS — Z95811 Presence of heart assist device: Secondary | ICD-10-CM | POA: Insufficient documentation

## 2023-02-02 DIAGNOSIS — A498 Other bacterial infections of unspecified site: Secondary | ICD-10-CM | POA: Insufficient documentation

## 2023-02-02 DIAGNOSIS — Y828 Other medical devices associated with adverse incidents: Secondary | ICD-10-CM | POA: Diagnosis not present

## 2023-02-02 DIAGNOSIS — Z7901 Long term (current) use of anticoagulants: Secondary | ICD-10-CM

## 2023-02-02 DIAGNOSIS — T829XXD Unspecified complication of cardiac and vascular prosthetic device, implant and graft, subsequent encounter: Secondary | ICD-10-CM | POA: Diagnosis not present

## 2023-02-02 DIAGNOSIS — T827XXA Infection and inflammatory reaction due to other cardiac and vascular devices, implants and grafts, initial encounter: Secondary | ICD-10-CM

## 2023-02-02 DIAGNOSIS — T827XXD Infection and inflammatory reaction due to other cardiac and vascular devices, implants and grafts, subsequent encounter: Secondary | ICD-10-CM | POA: Insufficient documentation

## 2023-02-02 NOTE — Progress Notes (Addendum)
Pt presents to [facility] for dressing change. Pt recently discharged for drive line infection. Cultures positive for pseudomonas. Completed 3 days of IV Cefepime. Pt remains on Cipro 750mg  BID and Kelfex for previous MSSA infection. States he is taking his antibiotics as prescribed. Pt no showed ID f/u 9/12.   Wound culture obtained today.   Drive Line: Existing VAD dressing removed and site care performed using sterile technique per Dr Donata Clay. Wound bed cleansed with VASHE solution and sterile 4x4 gauze through light manual debridement. Skin surrounding wound bed cleaned with Chlora prep applicators x 2, allowed to dry. 4 VASHE moistened 2 x 2s placed in wound bed, covered with extra dry 4 x 4s. Drive line unincorporated. Moderate amount of serosanguinous drainage noted. No redness, tenderness, foul odor or rash noted. Wound bed beefy red. Drive line anchor secured. Covered entire dressing with large tegaderms. Pt has sufficient kits at home. Will change dressing Monday/Wednesday/Friday in V[facility] clinic per Dr Donata Clay. Next dressing change due 02/04/23.       Plan: Return to [facility] Friday morning at 0930am for dressing change    Alyce Pagan RN [facility] Coordinator  Office: [phone]  24/7 Pager: [phone]   Agree with above assessment and plan documented by A Haggard RN  Patient examined and VAD exit wound assessed and a sterile culture and dressing change was performed with Vashe wet/dry 2x2 gauze.  The wound is 100% clean granulation tissue and is contracting now 2.5 x 2.5 cm.  EXAM Alert and appropriate accompanied by his partner. Lungs clear VAD with normal hum No abdominal wall tenderness or induration Sterile dressing change completed at exit site wound  Plan: Follow up wound culture results Wound care at [facility] clinic M-W-F Cont po Keflex and cipro for hx MSSA and pseudomonas infections  P Donata Clay MD

## 2023-02-02 NOTE — Addendum Note (Signed)
Encounter addended by: Lovett Sox, MD on: 02/02/2023 1:11 PM  Actions taken: Clinical Note Signed

## 2023-02-04 ENCOUNTER — Ambulatory Visit (HOSPITAL_COMMUNITY)
Admission: RE | Admit: 2023-02-04 | Discharge: 2023-02-04 | Disposition: A | Discharge status: Home / Self Care | Payer: 59 | Source: Ambulatory Visit | Attending: Cardiology

## 2023-02-04 ENCOUNTER — Ambulatory Visit (HOSPITAL_COMMUNITY): Payer: Self-pay | Admitting: Pharmacist

## 2023-02-04 DIAGNOSIS — Z95811 Presence of heart assist device: Secondary | ICD-10-CM | POA: Insufficient documentation

## 2023-02-04 DIAGNOSIS — Z7901 Long term (current) use of anticoagulants: Secondary | ICD-10-CM | POA: Diagnosis not present

## 2023-02-04 LAB — PROTIME-INR
INR: 2 — ABNORMAL HIGH (ref 0.8–1.2)
Prothrombin Time: 22.8 seconds — ABNORMAL HIGH (ref 11.4–15.2)

## 2023-02-04 LAB — AEROBIC CULTURE W GRAM STAIN (SUPERFICIAL SPECIMEN)
Culture: NORMAL
Gram Stain: NONE SEEN

## 2023-02-04 NOTE — Progress Notes (Addendum)
Pt presents to [facility] for dressing change. Pt recently discharged for drive line infection. Cultures positive for pseudomonas. Completed 3 days of IV Cefepime. Pt remains on Cipro 750mg  BID and Kelfex for previous MSSA infection. States he is taking his antibiotics as prescribed. Pt no showed ID f/u 9/12.   Drive Line: Existing VAD dressing removed and site care performed using sterile technique per Dr Donata Clay. Wound bed cleansed with VASHE solution and sterile 4x4 gauze through light manual debridement. Skin surrounding wound bed cleaned with Chlora prep applicators x 2, allowed to dry. 4 VASHE moistened 2 x 2s placed in wound bed, covered with extra dry 4 x 4s. Drive line unincorporated. Moderate amount of serosanguinous drainage noted. No redness, tenderness, foul odor or rash noted. Wound bed beefy red. Drive line anchor secured. Covered entire dressing with large tegaderms. Pt has sufficient kits at home. Will change dressing Monday/Wednesday/Friday in [facility] clinic per Dr Donata Clay. Next dressing change due 02/07/23.         Plan: Return to [facility] Monday for dressing change  Coumadin dosing per Haynes Kerns RN VAD Coordinator  Office: [phone]  24/7 Pager: [phone]   CT surgery  Patient examined in the VAD exit wound personally examined and repacked with Vashe wet-to-dry dressing. The wound continues to make excellent progress with 99% granulation tissue and minimal fibrinous exudate which was removed.  It is now being packed with 2 x 2 gauze.  The patient is compliant with his oral antibiotics including Keflex and Cipro.  Plan-continue daily wet-to-dry dressing changes at home.  Follow-up in [facility] clinic for wound care September 23    P Donata Clay MD

## 2023-02-04 NOTE — Addendum Note (Signed)
Encounter addended by: Lovett Sox, MD on: 02/04/2023 1:27 PM  Actions taken: Clinical Note Signed

## 2023-02-04 NOTE — Addendum Note (Signed)
Encounter addended by: Lebron Quam, RN on: 02/04/2023 11:55 AM  Actions taken: Clinical Note Signed

## 2023-02-07 ENCOUNTER — Ambulatory Visit (HOSPITAL_COMMUNITY)
Admission: RE | Admit: 2023-02-07 | Discharge: 2023-02-07 | Disposition: A | Discharge status: Home / Self Care | Payer: 59 | Source: Ambulatory Visit | Attending: Cardiology

## 2023-02-07 DIAGNOSIS — T827XXA Infection and inflammatory reaction due to other cardiac and vascular devices, implants and grafts, initial encounter: Secondary | ICD-10-CM | POA: Diagnosis present

## 2023-02-07 DIAGNOSIS — Z95811 Presence of heart assist device: Secondary | ICD-10-CM | POA: Diagnosis present

## 2023-02-07 DIAGNOSIS — B965 Pseudomonas (aeruginosa) (mallei) (pseudomallei) as the cause of diseases classified elsewhere: Secondary | ICD-10-CM | POA: Diagnosis not present

## 2023-02-07 NOTE — Progress Notes (Signed)
Pt presents to [facility] for dressing change. Cultures positive for pseudomonas. Completed 3 days of IV Cefepime. Pt remains on Cipro 750mg  BID and Kelfex 500 tid for previous MSSA infection. States he is taking his antibiotics as prescribed.   Drive Line: Existing VAD dressing removed and site care performed using sterile technique per Dr Donata Clay. Wound bed cleansed with VASHE solution and sterile 4x4 gauze through light manual debridement. Skin surrounding wound bed cleaned with Chlora prep applicators x 2, allowed to dry. 4 VASHE moistened 2 x 2s placed in wound bed, covered with extra dry 4 x 4s. Drive line unincorporated. Small amount of serosanguinous drainage noted. No redness, tenderness, foul odor or rash noted. Wound bed beefy red. Drive line anchor secured. Covered entire dressing with large tegaderms. Pt has sufficient kits at home.   Plan: Return to [facility] Thursday for dressing change per Dr. Maren Beach.   Hessie Diener RN [facility] Coordinator  Office: [phone]  24/7 Pager: [phone]

## 2023-02-09 LAB — AEROBIC CULTURE W GRAM STAIN (SUPERFICIAL SPECIMEN): Gram Stain: NONE SEEN

## 2023-02-10 ENCOUNTER — Other Ambulatory Visit (HOSPITAL_COMMUNITY): Payer: Self-pay

## 2023-02-10 ENCOUNTER — Telehealth (HOSPITAL_COMMUNITY): Payer: Self-pay | Admitting: Licensed Clinical Social Worker

## 2023-02-10 ENCOUNTER — Ambulatory Visit (HOSPITAL_COMMUNITY): Payer: Self-pay | Admitting: Pharmacist

## 2023-02-10 ENCOUNTER — Ambulatory Visit (HOSPITAL_COMMUNITY)
Admission: RE | Admit: 2023-02-10 | Discharge: 2023-02-10 | Disposition: A | Discharge status: Home / Self Care | Payer: 59 | Source: Ambulatory Visit | Attending: Internal Medicine | Admitting: Internal Medicine

## 2023-02-10 DIAGNOSIS — Z95811 Presence of heart assist device: Secondary | ICD-10-CM | POA: Diagnosis present

## 2023-02-10 DIAGNOSIS — A498 Other bacterial infections of unspecified site: Secondary | ICD-10-CM

## 2023-02-10 DIAGNOSIS — I5042 Chronic combined systolic (congestive) and diastolic (congestive) heart failure: Secondary | ICD-10-CM

## 2023-02-10 DIAGNOSIS — Z8619 Personal history of other infectious and parasitic diseases: Secondary | ICD-10-CM | POA: Diagnosis not present

## 2023-02-10 DIAGNOSIS — T827XXD Infection and inflammatory reaction due to other cardiac and vascular devices, implants and grafts, subsequent encounter: Secondary | ICD-10-CM | POA: Insufficient documentation

## 2023-02-10 DIAGNOSIS — Z7901 Long term (current) use of anticoagulants: Secondary | ICD-10-CM

## 2023-02-10 DIAGNOSIS — T827XXA Infection and inflammatory reaction due to other cardiac and vascular devices, implants and grafts, initial encounter: Secondary | ICD-10-CM

## 2023-02-10 DIAGNOSIS — Z4509 Encounter for adjustment and management of other cardiac device: Secondary | ICD-10-CM | POA: Insufficient documentation

## 2023-02-10 DIAGNOSIS — B965 Pseudomonas (aeruginosa) (mallei) (pseudomallei) as the cause of diseases classified elsewhere: Secondary | ICD-10-CM | POA: Insufficient documentation

## 2023-02-10 DIAGNOSIS — I50812 Chronic right heart failure: Secondary | ICD-10-CM

## 2023-02-10 LAB — AEROBIC CULTURE W GRAM STAIN (SUPERFICIAL SPECIMEN)

## 2023-02-10 LAB — PROTIME-INR
INR: 2 — ABNORMAL HIGH (ref 0.8–1.2)
Prothrombin Time: 22.8 seconds — ABNORMAL HIGH (ref 11.4–15.2)

## 2023-02-10 MED ORDER — CIPROFLOXACIN HCL 750 MG PO TABS: 750.0000 mg | ORAL_TABLET | Freq: Two times a day (BID) | ORAL | 1 refills | Status: DC

## 2023-02-10 MED ORDER — SILDENAFIL CITRATE 20 MG PO TABS: 20.0000 mg | ORAL_TABLET | Freq: Three times a day (TID) | ORAL | 11 refills | Status: DC

## 2023-02-10 NOTE — Progress Notes (Signed)
Pt presents to [facility] for dressing change and INR. 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 Culture positive for pseudomonas. Completed 3 days of IV Cefepime. Pt remains on Cipro 750mg  BID and Kelfex 500 tid for previous MSSA infection. States he is taking his antibiotics as prescribed but is almost out of Cipro.   Discussed with Dr. Drue Second and Rexene Alberts NP today will continue Cipro 750 BID for 2 months. ID to arrange follow up.  Drive Line: Existing VAD dressing removed and site care performed using sterile technique per Dr Donata Clay. Wound bed cleansed with VASHE solution and sterile 4x4 gauze through light manual debridement. Skin surrounding wound bed cleaned with Chlora prep applicators x 2, allowed to dry. 4 VASHE moistened 2 x 2s packed in wound bed, covered with extra dry 4 x 4s. Drive line unincorporated. Small amount of serosanguinous drainage noted. No redness, tenderness, foul odor or rash noted. Wound bed beefy red. Drive line anchor secured. Covered entire dressing with large tegaderms. Pt has sufficient kits at home.     Plan: Return to [facility] Monday at 9:30am for dressing change per Dr. Maren Beach.   [name] RN,BSN [facility] Coordinator  Office: [phone]  24/7 Pager: [phone]

## 2023-02-10 NOTE — Telephone Encounter (Signed)
H&V Care Navigation CSW Progress Note  Pt requested to speak with pt about housing assistance.  Pt inquired if there is any assistance with housing costs at this time.  He lives in a house with his son and pays $825/month in rent.  States his daughter was living with him and contributing to rent but has now moved out.  Patient gets around $1,100/month from SSDI as well as income from part time work.  CSW explained that there is no ongoing housing cost assistance outside of income based housing or section 8 voucher.  Explained the limitations of these programs given long waitlists and need for him to consider applying now if he feels as if this will be an issue as the waitlist can last over a year.  CSW provided with list of [place] income based housing options and encouraged pt to find a few options he likes and apply for waitlist now.  Encouraged pt to reach out if he had emergency expenses and needs one time assistance prior to then.   SDOH Screenings   Food Insecurity: No Food Insecurity (01/14/2023)  Housing: Low Risk  (01/14/2023)  Transportation Needs: No Transportation Needs (01/14/2023)  Utilities: Not At Risk (01/14/2023)  Alcohol Screen: Low Risk  (01/14/2023)  Depression (PHQ2-9): Medium Risk (09/25/2019)  Financial Resource Strain: Low Risk  (01/14/2023)  Physical Activity: Sufficiently Active (01/14/2023)  Stress: No Stress Concern Present (01/14/2023)  Tobacco Use: Medium Risk (01/13/2023)   Burna Sis, LCSW Clinical Social Worker [facility] Desk#: [phone] Cell#: [phone]

## 2023-02-11 ENCOUNTER — Ambulatory Visit: Payer: 59 | Admitting: Family Medicine

## 2023-02-13 LAB — AEROBIC CULTURE W GRAM STAIN (SUPERFICIAL SPECIMEN)

## 2023-02-14 ENCOUNTER — Ambulatory Visit (HOSPITAL_COMMUNITY)
Admission: RE | Admit: 2023-02-14 | Discharge: 2023-02-14 | Disposition: A | Discharge status: Home / Self Care | Payer: 59 | Source: Ambulatory Visit | Attending: Internal Medicine | Admitting: Internal Medicine

## 2023-02-14 DIAGNOSIS — Z7901 Long term (current) use of anticoagulants: Secondary | ICD-10-CM | POA: Insufficient documentation

## 2023-02-14 DIAGNOSIS — Z4801 Encounter for change or removal of surgical wound dressing: Secondary | ICD-10-CM | POA: Insufficient documentation

## 2023-02-14 DIAGNOSIS — T827XXA Infection and inflammatory reaction due to other cardiac and vascular devices, implants and grafts, initial encounter: Secondary | ICD-10-CM

## 2023-02-14 DIAGNOSIS — T829XXD Unspecified complication of cardiac and vascular prosthetic device, implant and graft, subsequent encounter: Secondary | ICD-10-CM

## 2023-02-14 DIAGNOSIS — Z95811 Presence of heart assist device: Secondary | ICD-10-CM | POA: Insufficient documentation

## 2023-02-14 DIAGNOSIS — A498 Other bacterial infections of unspecified site: Secondary | ICD-10-CM

## 2023-02-14 MED ORDER — DOXYCYCLINE HYCLATE 100 MG PO CAPS: 100.0000 mg | ORAL_CAPSULE | Freq: Two times a day (BID) | ORAL | 0 refills | Status: DC

## 2023-02-14 NOTE — Progress Notes (Addendum)
Pt presents to [facility] for dressing change and INR. 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 Culture positive for pseudomonas. Completed 3 days of IV Cefepime. Pt remains on Cipro 750mg  BID and Kelfex 500 TID for previous MSSA infection. States he is taking his antibiotics as prescribed.   Discussed with Dr. Drue Second and Rexene Alberts NP last week and will continue Cipro 750 BID for 2 months. Wound cx 9/26 + staph epidermidis. Discussed with ID team. Plan as follows: Doxy 100 mg bid + Cipro current dose combined for 2 weeks (the doxy will continue to cover the known MSSA bacteremia history). Then 02/28/23 resume the Cephalexin and continue the Cipro until he can be seen in ID clinic. ID clinic has reached out several times to reschedule appt.   Prescription sent to pt's pharmacy for Doxy. Discussed above plan with pt who verbalized understanding.   Drive Line: Existing VAD dressing removed and site care performed using sterile technique per Dr Donata Clay. Wound bed cleansed with VASHE solution and sterile 4x4 gauze through light manual debridement. Skin surrounding wound bed cleaned with Chlora prep applicators x 2, allowed to dry. 4 VASHE moistened 2 x 2s packed in wound bed, covered with extra dry 4 x 4s. Drive line unincorporated. Moderate amount of serosanguinous drainage noted. Slight green tinge to packing removed from wound tunnel. No redness, tenderness, foul odor or rash noted. Wound bed beefy red. Drive line anchor reapplied x 2. Covered entire dressing with large tegaderms. Pt has sufficient kits at home.      Plan: Return to [facility] Friday at 9:30am for dressing change per Dr. Maren Beach.   Alyce Pagan RN V[facility] Coordinator  Office: [phone]  24/7 Pager: [phone]

## 2023-02-16 ENCOUNTER — Encounter: Payer: Self-pay | Admitting: Internal Medicine

## 2023-02-18 ENCOUNTER — Ambulatory Visit (HOSPITAL_COMMUNITY)
Admission: RE | Admit: 2023-02-18 | Discharge: 2023-02-18 | Disposition: A | Discharge status: Home / Self Care | Payer: 59 | Source: Ambulatory Visit | Attending: Internal Medicine

## 2023-02-18 DIAGNOSIS — Z7901 Long term (current) use of anticoagulants: Secondary | ICD-10-CM | POA: Insufficient documentation

## 2023-02-18 DIAGNOSIS — Z4509 Encounter for adjustment and management of other cardiac device: Secondary | ICD-10-CM | POA: Diagnosis present

## 2023-02-18 DIAGNOSIS — Z95811 Presence of heart assist device: Secondary | ICD-10-CM | POA: Diagnosis not present

## 2023-02-18 DIAGNOSIS — Z5181 Encounter for therapeutic drug level monitoring: Secondary | ICD-10-CM | POA: Insufficient documentation

## 2023-02-18 NOTE — Addendum Note (Signed)
Encounter addended by: Lovett Sox, MD on: 02/18/2023 10:06 AM  Actions taken: Clinical Note Signed

## 2023-02-18 NOTE — Progress Notes (Addendum)
Pt presents to [facility] for dressing change and INR. 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 Culture positive for pseudomonas. Completed 3 days of IV Cefepime. Pt remains on Cipro 750mg  BID and Doxy 100 mg bid. Holding Keflex for now. States he is taking his antibiotics as prescribed.   Discussed with Dr. Drue Second and Rexene Alberts NP and will continue Cipro 750 BID for 2 months. Wound cx 9/26 + staph epidermidis. Discussed with ID team. Plan as follows: Doxy 100 mg bid + Cipro current dose combined for 2 weeks (the doxy will continue to cover the known MSSA bacteremia history). Then 02/28/23 resume the Cephalexin and continue the Cipro until he can be seen in ID clinic. ID clinic has reached out several times to reschedule appt.   Drive Line: Existing VAD dressing removed and site care performed using sterile technique per Dr Donata Clay. Wound bed cleansed with VASHE solution and sterile 4x4 gauze. Proud flesh was debrided by Dr Donata Clay, silver nitrate used to achieve hemostasis following debridement. Skin surrounding wound bed cleaned with Chlora prep applicators x 2, allowed to dry. 4 VASHE moistened 2 x 2s packed in wound bed, covered with extra dry 4 x 4s. Drive line unincorporated. Moderate amount of brown drainage noted. Slight green tinge to packing removed from wound tunnel. No redness, tenderness, foul odor or rash noted. Drive line anchor reapplied x 2. Covered entire dressing with large tegaderms. Pt has sufficient kits at home.        Plan: Return to [facility] Monday at 9:30am for dressing change per Dr. Maren Beach.   Carlton Adam RN V[facility] Coordinator  Office: [phone]  24/7 Pager: [phone]   Patient examined and wound care performed. Excision of poor quality granulation tissue around wound periphery. Repacked with 2x2 Vashe wet/dry He had MR staph epi  so now taking doxycycline with cipro.  NSR Normal VAD hum, flow 4.5  L/min  Plan- twice weekly dressing changes in [facility] clinic.          Oral antibiotics P Donata Clay MD

## 2023-02-20 ENCOUNTER — Other Ambulatory Visit (HOSPITAL_COMMUNITY): Payer: Self-pay | Admitting: Internal Medicine

## 2023-02-20 DIAGNOSIS — G629 Polyneuropathy, unspecified: Secondary | ICD-10-CM

## 2023-02-20 DIAGNOSIS — R52 Pain, unspecified: Secondary | ICD-10-CM

## 2023-02-21 ENCOUNTER — Ambulatory Visit (HOSPITAL_COMMUNITY)
Admission: RE | Admit: 2023-02-21 | Discharge: 2023-02-21 | Disposition: A | Discharge status: Home / Self Care | Payer: 59 | Source: Ambulatory Visit | Attending: Cardiothoracic Surgery | Admitting: Cardiothoracic Surgery

## 2023-02-21 ENCOUNTER — Ambulatory Visit (HOSPITAL_COMMUNITY): Payer: Self-pay | Admitting: Pharmacist

## 2023-02-21 ENCOUNTER — Other Ambulatory Visit (HOSPITAL_COMMUNITY): Payer: Self-pay | Admitting: Cardiothoracic Surgery

## 2023-02-21 DIAGNOSIS — Z7901 Long term (current) use of anticoagulants: Secondary | ICD-10-CM | POA: Diagnosis not present

## 2023-02-21 DIAGNOSIS — Z4509 Encounter for adjustment and management of other cardiac device: Secondary | ICD-10-CM | POA: Insufficient documentation

## 2023-02-21 DIAGNOSIS — A4152 Sepsis due to Pseudomonas: Secondary | ICD-10-CM

## 2023-02-21 DIAGNOSIS — Z95811 Presence of heart assist device: Secondary | ICD-10-CM | POA: Insufficient documentation

## 2023-02-21 DIAGNOSIS — T829XXA Unspecified complication of cardiac and vascular prosthetic device, implant and graft, initial encounter: Secondary | ICD-10-CM

## 2023-02-21 DIAGNOSIS — A498 Other bacterial infections of unspecified site: Secondary | ICD-10-CM

## 2023-02-21 LAB — PROTIME-INR
INR: 2.5 — ABNORMAL HIGH (ref 0.8–1.2)
Prothrombin Time: 27.6 s — ABNORMAL HIGH (ref 11.4–15.2)

## 2023-02-21 NOTE — Addendum Note (Signed)
Encounter addended by: Lebron Quam, RN on: 02/21/2023 12:43 PM  Actions taken: Actions taken from a BestPractice Advisory, Care Plan modified, Visit diagnoses modified, Order list changed, Diagnosis association updated

## 2023-02-21 NOTE — Progress Notes (Addendum)
Pt presents to [facility] for dressing change and INR. 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 Culture positive for pseudomonas. Completed 3 days of IV Cefepime. Pt remains on Cipro 750mg  BID and Doxy 100 mg bid. Holding Keflex for now. States he is taking his antibiotics as prescribed.   Discussed with Dr. Drue Second and Rexene Alberts NP and will continue Cipro 750 BID for 2 months. Wound cx 9/26 + staph epidermidis. Discussed with ID team. Plan as follows: Doxy 100 mg bid + Cipro current dose combined for 2 weeks (the doxy will continue to cover the known MSSA bacteremia history). Then 02/28/23 resume the Cephalexin and continue the Cipro until he can be seen in ID clinic. ID clinic has reached out several times to reschedule appt. Pt instructed today that he needs to reach out to ID to reschedule this appt.  Pt paged the pager over the weekend complaining of pain at the bottom of his sternum. He states that it hurts when he exhales. Pt c/o this same pain today. D/w Dr Donata Clay as pt has not had CT scan of this area in a few years, pt scheduled for CT chest/abd/pelvis after his appt with [place] on Friday.  Drive Line: Existing VAD dressing removed and site care performed using sterile technique. Wound bed cleansed with VASHE solution and sterile 4x4 gauze. Skin surrounding wound bed cleaned with Chlora prep applicators x 2, allowed to dry. 4 VASHE moistened 2 x 2s packed in wound bed, covered with extra dry 4 x 4s. Drive line unincorporated. Gross amount of brown drainage w/foul odor noted. Slight green tinge to packing removed from wound tunnel. No redness, tenderness, or rash noted. Drive line anchor reapplied x 2. Covered entire dressing with large tegaderms. Pt given given 7 daily kits and 10 anchors today.       Plan: Return to [facility] Friday at 9:00 am for dressing change per Dr. Maren Beach.   Carlton Adam RN V[facility] Coordinator  Office:  [phone]  24/7 Pager: [phone]

## 2023-02-21 NOTE — Addendum Note (Signed)
Encounter addended by: Lebron Quam, RN on: 02/21/2023 12:57 PM  Actions taken: Actions taken from a BestPractice Advisory, Diagnosis association updated, Clinical Note Signed, Order list changed

## 2023-02-21 NOTE — Addendum Note (Signed)
Encounter addended by: Lebron Quam, RN on: 02/21/2023 5:59 PM  Actions taken: Charge Capture section accepted

## 2023-02-25 ENCOUNTER — Ambulatory Visit (HOSPITAL_COMMUNITY)
Admission: RE | Admit: 2023-02-25 | Discharge: 2023-02-25 | Disposition: A | Discharge status: Home / Self Care | Payer: 59 | Source: Ambulatory Visit | Attending: Cardiology | Admitting: Cardiology

## 2023-02-25 ENCOUNTER — Other Ambulatory Visit (HOSPITAL_COMMUNITY): Payer: Self-pay | Admitting: Unknown Physician Specialty

## 2023-02-25 ENCOUNTER — Ambulatory Visit (HOSPITAL_COMMUNITY)
Admission: RE | Admit: 2023-02-25 | Discharge: 2023-02-25 | Disposition: A | Discharge status: Home / Self Care | Payer: 59 | Source: Ambulatory Visit | Attending: Cardiothoracic Surgery | Admitting: Cardiothoracic Surgery

## 2023-02-25 DIAGNOSIS — T827XXA Infection and inflammatory reaction due to other cardiac and vascular devices, implants and grafts, initial encounter: Secondary | ICD-10-CM

## 2023-02-25 DIAGNOSIS — Z95811 Presence of heart assist device: Secondary | ICD-10-CM | POA: Diagnosis not present

## 2023-02-25 DIAGNOSIS — T829XXA Unspecified complication of cardiac and vascular prosthetic device, implant and graft, initial encounter: Secondary | ICD-10-CM | POA: Insufficient documentation

## 2023-02-25 DIAGNOSIS — Z4801 Encounter for change or removal of surgical wound dressing: Secondary | ICD-10-CM | POA: Insufficient documentation

## 2023-02-25 DIAGNOSIS — Y828 Other medical devices associated with adverse incidents: Secondary | ICD-10-CM | POA: Diagnosis not present

## 2023-02-25 DIAGNOSIS — Z7901 Long term (current) use of anticoagulants: Secondary | ICD-10-CM | POA: Insufficient documentation

## 2023-02-25 MED ORDER — IOHEXOL 350 MG/ML SOLN
75.0000 mL | Freq: Once | INTRAVENOUS | Status: AC | PRN
  Administered 2023-02-25: 75 mL via INTRAVENOUS

## 2023-02-25 NOTE — Progress Notes (Addendum)
Pt presents to [facility] for dressing change and INR. 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 Culture positive for pseudomonas. Completed 3 days of IV Cefepime. Pt remains on Cipro 750mg  BID and Doxy 100 mg bid. Holding Keflex for now. States he is taking his antibiotics as prescribed.   Discussed with Dr. Drue Second and Rexene Alberts NP and will continue Cipro 750 BID for 2 months. Wound cx 9/26 + staph epidermidis. Discussed with ID team. Plan as follows: Doxy 100 mg bid + Cipro current dose combined for 2 weeks (the doxy will continue to cover the known MSSA bacteremia history). Then 02/28/23 resume the Cephalexin and continue the Cipro until he can be seen in ID clinic. ID clinic has reached out several times to reschedule appt. Pt instructed today that he needs to reach out to ID to reschedule this appt.  Pt paged the pager over the weekend complaining of pain at the bottom of his sternum. He states that it hurts when he exhales. Pt states that his pain is much better today and he thinks he "pulled a muscle." D/w Dr Donata Clay as pt has not had CT scan of this area in a few years, pt scheduled for CT chest/abd/pelvis after his appt today.  Driveline looks much better today with less drainage. Pt seems to be responding well to the doxycycline.   Drive Line: Existing VAD dressing removed and site care performed using sterile technique. Wound bed cleansed with VASHE solution and sterile 4x4 gauze by Dr Donata Clay. Skin surrounding wound bed cleaned with Chlora prep applicators x 2, allowed to dry. 4 VASHE moistened 2 x 2s packed in wound bed, covered with extra dry 4 x 4s. Drive line unincorporated. Moderate amount of brown/bloody drainage w/no odor noted.  No redness, tenderness, or rash noted. Drive line anchor reapplied x 2. Covered entire dressing with large tegaderms. Pt has sufficient dressing kits at home.      Plan: Return to [facility]  Monday at 9:00 am for dressing change per Dr. Maren Beach and INR.   Carlton Adam RN VAD Coordinator  Office: [phone]  24/7 Pager: [phone]    CT surgery note  Patient examined and images of CT scan of chest and abdomen personally reviewed.  Agree with above note.  Patient returns for scheduled follow-up and wound care.  His recent driveline wound culture returned staff epidermidis methicillin-resistant and his antibiotic coverage has been changed to doxycycline with Cipro for previous gram-negative involvement.  The wound is inspected and is 100% clean granulation tissue.  There is contraction of the wound with granulation tissue and incorporation of the power cord.  There is no significant tunnel.  No sharp debridement was needed, no fibrinous exudate was present.  Vashe wet-to-dry dressings with 2 x 2 gauze was then applied and a sterile dressing. The patient will continue twice a week [facility] clinic dressing changes and his current antibiotic.  The CT images are reviewed.  There is no evidence of deeper VAD tunnel infection and the pump pocket appears to be clean as well.  Final report is pending.  Exam  Alert and appropriate Normal sinus rhythm heart rate 84, normal VAD hum Lungs clear VAD, wound now completely granulating with shallow depth of the wound.  P Donata Clay MD

## 2023-02-25 NOTE — Addendum Note (Signed)
Encounter addended by: Lovett Sox, MD on: 02/25/2023 10:56 AM  Actions taken: Clinical Note Signed

## 2023-02-28 ENCOUNTER — Ambulatory Visit (HOSPITAL_COMMUNITY)
Admission: RE | Admit: 2023-02-28 | Discharge: 2023-02-28 | Disposition: A | Discharge status: Home / Self Care | Payer: 59 | Source: Ambulatory Visit | Attending: Cardiology

## 2023-02-28 ENCOUNTER — Ambulatory Visit (HOSPITAL_COMMUNITY): Payer: Self-pay | Admitting: Pharmacist

## 2023-02-28 DIAGNOSIS — Z5181 Encounter for therapeutic drug level monitoring: Secondary | ICD-10-CM | POA: Insufficient documentation

## 2023-02-28 DIAGNOSIS — Z95811 Presence of heart assist device: Secondary | ICD-10-CM | POA: Diagnosis not present

## 2023-02-28 DIAGNOSIS — Z4509 Encounter for adjustment and management of other cardiac device: Secondary | ICD-10-CM | POA: Insufficient documentation

## 2023-02-28 DIAGNOSIS — Z7901 Long term (current) use of anticoagulants: Secondary | ICD-10-CM | POA: Insufficient documentation

## 2023-02-28 DIAGNOSIS — Z22321 Carrier or suspected carrier of Methicillin susceptible Staphylococcus aureus: Secondary | ICD-10-CM | POA: Insufficient documentation

## 2023-02-28 DIAGNOSIS — T829XXD Unspecified complication of cardiac and vascular prosthetic device, implant and graft, subsequent encounter: Secondary | ICD-10-CM

## 2023-02-28 DIAGNOSIS — T827XXA Infection and inflammatory reaction due to other cardiac and vascular devices, implants and grafts, initial encounter: Secondary | ICD-10-CM

## 2023-02-28 DIAGNOSIS — A498 Other bacterial infections of unspecified site: Secondary | ICD-10-CM

## 2023-02-28 LAB — PROTIME-INR
INR: 2.3 — ABNORMAL HIGH (ref 0.8–1.2)
Prothrombin Time: 25.3 s — ABNORMAL HIGH (ref 11.4–15.2)

## 2023-02-28 MED ORDER — DOXYCYCLINE HYCLATE 100 MG PO CAPS: 100.0000 mg | ORAL_CAPSULE | Freq: Two times a day (BID) | ORAL | 0 refills | Status: AC

## 2023-02-28 NOTE — Progress Notes (Signed)
Pt presents to [facility] for dressing change and INR. 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 Culture positive for pseudomonas. Completed 3 days of IV Cefepime. Pt remains on Cipro 750mg  BID and Doxy 100 mg bid. Holding Keflex for now. States he is taking his antibiotics as prescribed.   Discussed with Dr. Drue Second and Rexene Alberts NP and will continue Cipro 750 BID for 2 months. Wound cx 9/26 + staph epidermidis. Discussed with ID team. Plan as follows: Doxy 100 mg bid + Cipro current dose combined for 2 weeks (the doxy will continue to cover the known MSSA bacteremia history). Per Dr Maren Beach we will extend the Doxy for 2 more weeks. Script sent to [facility] drug. Pt assures me that he will make an appt with ID today.  Dr Donata Clay reviewed pts CT chest/abd/pelvis from this past Friday. No issues with driveline.  Driveline is looking better with less drainage. Pt seems to be responding well to the doxycycline.   Drive Line: Existing VAD dressing removed and site care performed using sterile technique. Wound bed cleansed with VASHE solution and sterile 4x4 gauze by Dr Donata Clay. Lightly debridement with swab. Skin surrounding wound bed cleaned with Chlora prep applicators x 2, allowed to dry. 4 VASHE moistened 2 x 2s packed in wound bed, covered with extra dry 4 x 4s. Drive line unincorporated. Moderate amount of brown/bloody drainage w/no odor noted.  No redness, tenderness, or rash noted. Drive line anchor reapplied x 2. Covered entire dressing with large tegaderms. Pt has sufficient dressing kits at home.       Plan: Return to [facility] Friday at 9:00 am for dressing change per Dr. Maren Beach  Coumadin dosing per Leotis Shames pharm-D  Carlton Adam RN [facility] Coordinator  Office: [phone]  24/7 Pager: [phone]

## 2023-03-04 ENCOUNTER — Ambulatory Visit (HOSPITAL_COMMUNITY)
Admission: RE | Admit: 2023-03-04 | Discharge: 2023-03-04 | Disposition: A | Discharge status: Home / Self Care | Payer: 59 | Source: Ambulatory Visit | Attending: Cardiology | Admitting: Cardiology

## 2023-03-04 DIAGNOSIS — Z95811 Presence of heart assist device: Secondary | ICD-10-CM | POA: Diagnosis not present

## 2023-03-04 DIAGNOSIS — Y828 Other medical devices associated with adverse incidents: Secondary | ICD-10-CM | POA: Diagnosis not present

## 2023-03-04 DIAGNOSIS — T827XXA Infection and inflammatory reaction due to other cardiac and vascular devices, implants and grafts, initial encounter: Secondary | ICD-10-CM | POA: Insufficient documentation

## 2023-03-04 DIAGNOSIS — Z4801 Encounter for change or removal of surgical wound dressing: Secondary | ICD-10-CM | POA: Diagnosis present

## 2023-03-04 NOTE — Addendum Note (Signed)
Encounter addended by: Lovett Sox, MD on: 03/04/2023 10:35 AM  Actions taken: Clinical Note Signed

## 2023-03-04 NOTE — Progress Notes (Addendum)
Pt presents to [facility] for dressing change and INR. 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 Culture positive for pseudomonas. Completed 3 days of IV Cefepime. Pt remains on Cipro 750mg  BID and Doxy 100 mg bid. Holding Keflex for now. States he is taking his antibiotics as prescribed.   Discussed with Dr. Drue Second and Rexene Alberts NP and will continue Cipro 750 BID for 2 months. Wound cx 9/26 + staph epidermidis. Discussed with ID team. Plan as follows: Doxy 100 mg bid + Cipro current dose combined for 2 weeks (the doxy will continue to cover the known MSSA bacteremia history). Per Dr Maren Beach we will extend the Doxy for 2 more weeks. Script sent to [facility]. Pt assures me that he will make an appt with ID today.  Dr Donata Clay reviewed pts CT chest/abd/pelvis from this past Friday. No issues with driveline.  Driveline is looking better with less drainage. Pt seems to be responding well to the doxycycline.   Drive Line: Existing VAD dressing removed and site care performed using sterile technique. Wound bed cleansed with VASHE solution and sterile 4x4 gauze by Dr Donata Clay. Lightly debridement with swab. Skin surrounding wound bed cleaned with Chlora prep applicators x 2, allowed to dry. 4 VASHE moistened 2 x 2s packed in wound bed, covered with extra dry 4 x 4s. Drive line unincorporated. Small amount of brown/bloody drainage w/no odor noted.  No redness, tenderness, or rash noted. Drive line anchor reapplied x 2. Covered entire dressing with large tegaderms. Pt has sufficient dressing kits at home.    Plan: Return to [facility] Monday at 9:00 am for dressing change per Dr. Eloise Harman RN VAD Coordinator  Office: [phone]  24/7 Pager: [phone]   CT surgery note Agree with above assessment and plan. Patient examined, VAD exit wound examined and dressing change personally performed under sterile technique. The exit  site wound continues to improve with twice weekly Vashe wet-to-dry dressing changes and oral doxycycline for the recent culture for methicillin-resistant staph epidermis with continued Cipro for past history of Pseudomonas. Unhealthy granulation tissue at the surface of the wound was treated with topical silver nitrate cauterization.  The small space remaining around the power cord was packed with 2 x 2 Vashe wet-to-dry dressings.  VAD parameters are satisfactory Normal VAD hum No abdominal wall tenderness proximal to the exit site Lungs clear  Plan-continue twice weekly wet-to-dry Vashe dressing changes in the VAD clinic and continue current oral antibiotic plan as noted above.

## 2023-03-07 ENCOUNTER — Inpatient Hospital Stay (HOSPITAL_COMMUNITY)
Admission: RE | Admit: 2023-03-07 | Discharge: 2023-03-07 | Discharge status: Home / Self Care | Payer: 59 | Source: Ambulatory Visit | Attending: Cardiothoracic Surgery

## 2023-03-07 DIAGNOSIS — I110 Hypertensive heart disease with heart failure: Secondary | ICD-10-CM | POA: Diagnosis not present

## 2023-03-07 DIAGNOSIS — Z7901 Long term (current) use of anticoagulants: Secondary | ICD-10-CM | POA: Insufficient documentation

## 2023-03-07 DIAGNOSIS — T827XXD Infection and inflammatory reaction due to other cardiac and vascular devices, implants and grafts, subsequent encounter: Secondary | ICD-10-CM | POA: Diagnosis not present

## 2023-03-07 DIAGNOSIS — Z4509 Encounter for adjustment and management of other cardiac device: Secondary | ICD-10-CM | POA: Diagnosis present

## 2023-03-07 DIAGNOSIS — Z95811 Presence of heart assist device: Secondary | ICD-10-CM | POA: Insufficient documentation

## 2023-03-07 DIAGNOSIS — B965 Pseudomonas (aeruginosa) (mallei) (pseudomallei) as the cause of diseases classified elsewhere: Secondary | ICD-10-CM | POA: Diagnosis not present

## 2023-03-07 DIAGNOSIS — I5022 Chronic systolic (congestive) heart failure: Secondary | ICD-10-CM | POA: Insufficient documentation

## 2023-03-07 DIAGNOSIS — I10 Essential (primary) hypertension: Secondary | ICD-10-CM

## 2023-03-07 DIAGNOSIS — Z22321 Carrier or suspected carrier of Methicillin susceptible Staphylococcus aureus: Secondary | ICD-10-CM | POA: Insufficient documentation

## 2023-03-07 MED ORDER — DOXAZOSIN MESYLATE 2 MG PO TABS: 1.0000 mg | ORAL_TABLET | Freq: Every day | ORAL | 3 refills | Status: DC

## 2023-03-07 NOTE — Addendum Note (Signed)
Encounter addended by: Lovett Sox, MD on: 03/07/2023 10:08 AM  Actions taken: Clinical Note Signed

## 2023-03-07 NOTE — Progress Notes (Addendum)
Pt presents to [facility] for dressing change. 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 Culture positive for pseudomonas. Completed 3 days of IV Cefepime. Pt remains on Cipro 750mg  BID and Doxy 100 mg bid. Holding Keflex for now. States he is taking his antibiotics as prescribed.   Discussed with Dr. Drue Second and Rexene Alberts NP and will continue Cipro 750 BID for 2 months. Wound cx 9/26 + staph epidermidis. Discussed with ID team. Plan as follows: Doxy 100 mg bid + Cipro current dose combined for 2 weeks (the doxy will continue to cover the known MSSA bacteremia history). Per Dr Maren Beach we will extend the Doxy for 2 more weeks. Pt assures me that he will make an appt with ID.  Driveline is looking better with less drainage. Pt seems to be responding well to the Doxycycline.   Drive Line: Existing VAD dressing removed and site care performed using sterile technique. Wound bed cleansed with VASHE solution and sterile 4x4 gauze by Dr Donata Clay. Lightly debridement with swab. Small amount of bleeding noted after light debridement- 2 silver nitrate swabs to wound bed to achieve hemostasis per Dr Donata Clay. Skin surrounding wound bed cleaned with Chlora prep applicators x 2, allowed to dry. 4 VASHE moistened 2 x 2s packed in wound bed, covered with extra dry 4 x 4s. Drive line unincorporated. Moderate amount of brown/bloody drainage w/no odor noted.  No redness, tenderness, or rash noted. Drive line anchor reapplied x 2. Covered entire dressing with large tegaderms. Pt has sufficient dressing kits at home.      Plan: Return to [facility] Friday at 9:00 am for dressing change per Dr. Caryn Bee RN VAD Coordinator  Office: [phone]  24/7 Pager: [phone]    CT Surgery Patient examined and VAD wound care personally performed. Wound now almost completely healed- 90% clean granulation tissue.Small surface fibrinous exudate  scraped off and Vashe wet/dry 2x2 gauze placed on wound. Continue twice a week VAD clinic wound care to keep Vashe in contact with the site. Antibiotics as noted above.  Lungs clear NSR, normal VAD hum Abdominal wall nontender  P Donata Clay MD

## 2023-03-11 ENCOUNTER — Ambulatory Visit (HOSPITAL_COMMUNITY)
Admission: RE | Admit: 2023-03-11 | Discharge: 2023-03-11 | Disposition: A | Discharge status: Home / Self Care | Payer: 59 | Source: Ambulatory Visit | Attending: Cardiology | Admitting: Cardiology

## 2023-03-11 DIAGNOSIS — Z4801 Encounter for change or removal of surgical wound dressing: Secondary | ICD-10-CM | POA: Diagnosis present

## 2023-03-11 DIAGNOSIS — T827XXA Infection and inflammatory reaction due to other cardiac and vascular devices, implants and grafts, initial encounter: Secondary | ICD-10-CM | POA: Insufficient documentation

## 2023-03-11 DIAGNOSIS — Y828 Other medical devices associated with adverse incidents: Secondary | ICD-10-CM | POA: Insufficient documentation

## 2023-03-11 DIAGNOSIS — Z95811 Presence of heart assist device: Secondary | ICD-10-CM | POA: Insufficient documentation

## 2023-03-11 NOTE — Progress Notes (Signed)
Pt presents to [facility] for dressing change. 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 Culture positive for pseudomonas. Completed 3 days of IV Cefepime. Pt remains on Cipro 750mg  BID and Doxy 100 mg bid. Holding Keflex for now. States he is taking his antibiotics as prescribed.   Discussed with Dr. Drue Second and Rexene Alberts NP and will continue Cipro 750 BID for 2 months. Wound cx 9/26 + staph epidermidis. Discussed with ID team. Plan as follows: Doxy 100 mg bid + Cipro current dose combined for 2 weeks (the doxy will continue to cover the known MSSA bacteremia history). Per Dr Maren Beach we will extend the Doxy until 03/14/23.  Pt assures me that he will make an appt with ID.  Driveline is looking better with less drainage. Pt seems to be responding well to the Doxycycline.   Drive Line: Existing VAD dressing removed and site care performed using sterile technique. Wound bed cleansed with VASHE solution and sterile 4x4 gauze. Skin surrounding wound bed cleaned with Chlora prep applicators x 2, allowed to dry. Several VASHE moistened 2 x 2s placed at exit site, covered with extra dry 4 x 4s. Drive line partially incorporated. Moderate amount of brown/bloody drainage w/no odor noted.  No redness, tenderness, or rash noted. Drive line anchor reapplied x 2. Covered entire dressing with large tegaderms. Pt has sufficient dressing kits at home.     Plan: Return to [facility] Monday at 9:00 am for dressing change per Dr. Maren Beach  Per Lauren PharmD- will plan to collect INR next Friday at full visit   Alyce Pagan RN VAD Coordinator  Office: [phone]  24/7 Pager: [phone]

## 2023-03-14 ENCOUNTER — Encounter (HOSPITAL_COMMUNITY): Payer: 59 | Admitting: Internal Medicine

## 2023-03-14 ENCOUNTER — Encounter (HOSPITAL_COMMUNITY): Payer: 59

## 2023-03-15 ENCOUNTER — Ambulatory Visit (HOSPITAL_COMMUNITY)
Admission: RE | Admit: 2023-03-15 | Discharge: 2023-03-15 | Disposition: A | Discharge status: Home / Self Care | Payer: 59 | Source: Ambulatory Visit | Attending: Internal Medicine | Admitting: Internal Medicine

## 2023-03-15 ENCOUNTER — Telehealth (HOSPITAL_COMMUNITY): Payer: Self-pay | Admitting: Pharmacy Technician

## 2023-03-15 DIAGNOSIS — Z95811 Presence of heart assist device: Secondary | ICD-10-CM | POA: Diagnosis present

## 2023-03-15 DIAGNOSIS — T827XXD Infection and inflammatory reaction due to other cardiac and vascular devices, implants and grafts, subsequent encounter: Secondary | ICD-10-CM | POA: Insufficient documentation

## 2023-03-15 DIAGNOSIS — B9561 Methicillin susceptible Staphylococcus aureus infection as the cause of diseases classified elsewhere: Secondary | ICD-10-CM | POA: Diagnosis not present

## 2023-03-15 DIAGNOSIS — Z7901 Long term (current) use of anticoagulants: Secondary | ICD-10-CM | POA: Insufficient documentation

## 2023-03-15 DIAGNOSIS — B965 Pseudomonas (aeruginosa) (mallei) (pseudomallei) as the cause of diseases classified elsewhere: Secondary | ICD-10-CM | POA: Diagnosis not present

## 2023-03-15 DIAGNOSIS — Z4509 Encounter for adjustment and management of other cardiac device: Secondary | ICD-10-CM | POA: Insufficient documentation

## 2023-03-15 NOTE — Telephone Encounter (Signed)
Patient Advocate Encounter   Received notification from [employer] that prior authorization for Sildenafil is required.   PA submitted on CoverMyMeds Key [id number] Status is pending   Will continue to follow.

## 2023-03-15 NOTE — Progress Notes (Signed)
Pt presents to [facility] for dressing change. 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 Culture positive for pseudomonas. Completed 3 days of IV Cefepime. Pt remains on Cipro 750mg  BID and Doxy 100 mg bid. Holding Keflex for now. States he is taking his antibiotics as prescribed.   Discussed with Dr. Drue Second and Rexene Alberts NP last visit and will continue Cipro 750 BID for 2 months. Wound cx 9/26 + staph epidermidis. Discussed with ID team. Plan as follows: Doxy 100 mg bid + Cipro current dose combined for 2 weeks (the doxy will continue to cover the known MSSA bacteremia history). Pt completed Doxycycline 03/14/23. Pt needs to make f/u appointment with ID.  Drive Line: Existing VAD dressing removed and site care performed using sterile technique. Wound bed cleansed with VASHE solution and sterile 4x4 gauze. Skin surrounding wound bed cleaned with Chlora prep applicators x 2, allowed to dry. Several VASHE moistened 2 x 2s placed at exit site, covered with extra dry 4 x 4s. Drive line partially incorporated. Moderate amount of brown/bloody drainage w/no odor noted.  No redness, tenderness, or rash noted. Drive line anchor reapplied x 2. Covered entire dressing with large tegaderms. Pt has sufficient dressing kits at home.      Plan: Return to [facility] Friday at 9:00 am for 2 month f/u with Dr.Bensimhon and dressing change per Dr. Laveda Abbe RN,BSN VAD Coordinator  Office: [phone]  24/7 Pager: [phone]

## 2023-03-16 ENCOUNTER — Other Ambulatory Visit (HOSPITAL_COMMUNITY): Payer: Self-pay | Admitting: *Deleted

## 2023-03-16 DIAGNOSIS — Z7901 Long term (current) use of anticoagulants: Secondary | ICD-10-CM

## 2023-03-16 DIAGNOSIS — I5022 Chronic systolic (congestive) heart failure: Secondary | ICD-10-CM

## 2023-03-16 DIAGNOSIS — Z95811 Presence of heart assist device: Secondary | ICD-10-CM

## 2023-03-18 ENCOUNTER — Encounter (HOSPITAL_COMMUNITY): Payer: 59 | Admitting: Internal Medicine

## 2023-03-22 ENCOUNTER — Ambulatory Visit (HOSPITAL_COMMUNITY): Payer: Self-pay | Admitting: Pharmacist

## 2023-03-22 ENCOUNTER — Ambulatory Visit (HOSPITAL_COMMUNITY)
Admission: RE | Admit: 2023-03-22 | Discharge: 2023-03-22 | Disposition: A | Discharge status: Home / Self Care | Payer: 59 | Source: Ambulatory Visit | Attending: Internal Medicine | Admitting: Internal Medicine

## 2023-03-22 VITALS — BP 110/0 | HR 68 | Ht 74.0 in | Wt 217.6 lb

## 2023-03-22 DIAGNOSIS — Z7901 Long term (current) use of anticoagulants: Secondary | ICD-10-CM | POA: Insufficient documentation

## 2023-03-22 DIAGNOSIS — I10 Essential (primary) hypertension: Secondary | ICD-10-CM

## 2023-03-22 DIAGNOSIS — Z4801 Encounter for change or removal of surgical wound dressing: Secondary | ICD-10-CM | POA: Insufficient documentation

## 2023-03-22 DIAGNOSIS — Z95811 Presence of heart assist device: Secondary | ICD-10-CM | POA: Insufficient documentation

## 2023-03-22 DIAGNOSIS — Z720 Tobacco use: Secondary | ICD-10-CM

## 2023-03-22 DIAGNOSIS — I5022 Chronic systolic (congestive) heart failure: Secondary | ICD-10-CM | POA: Diagnosis not present

## 2023-03-22 DIAGNOSIS — T827XXA Infection and inflammatory reaction due to other cardiac and vascular devices, implants and grafts, initial encounter: Secondary | ICD-10-CM

## 2023-03-22 LAB — BASIC METABOLIC PANEL
Anion gap: 7 (ref 5–15)
BUN: 11 mg/dL (ref 6–20)
CO2: 25 mmol/L (ref 22–32)
Calcium: 9 mg/dL (ref 8.9–10.3)
Chloride: 109 mmol/L (ref 98–111)
Creatinine, Ser: 1 mg/dL (ref 0.61–1.24)
GFR, Estimated: >60 mL/min (ref >60)
Glucose, Bld: 108 mg/dL — ABNORMAL HIGH (ref 70–99)
Potassium: 4.1 mmol/L (ref 3.5–5.1)
Sodium: 141 mmol/L (ref 135–145)

## 2023-03-22 LAB — PROTIME-INR
INR: 2.5 — ABNORMAL HIGH (ref 0.8–1.2)
Prothrombin Time: 27.3 s — ABNORMAL HIGH (ref 11.4–15.2)

## 2023-03-22 LAB — CBC
HCT: 32.6 % — ABNORMAL LOW (ref 39.0–52.0)
Hemoglobin: 10.4 g/dL — ABNORMAL LOW (ref 13.0–17.0)
MCH: 27.9 pg (ref 26.0–34.0)
MCHC: 31.9 g/dL (ref 30.0–36.0)
MCV: 87.4 fL (ref 80.0–100.0)
Platelets: 174 10*3/uL (ref 150–400)
RBC: 3.73 MIL/uL — ABNORMAL LOW (ref 4.22–5.81)
RDW: 13.2 % (ref 11.5–15.5)
WBC: 4 10*3/uL (ref 4.0–10.5)
nRBC: 0 % (ref 0.0–0.2)

## 2023-03-22 LAB — LACTATE DEHYDROGENASE: LDH: 231 U/L — ABNORMAL HIGH (ref 98–192)

## 2023-03-22 MED ORDER — CIPROFLOXACIN HCL 750 MG PO TABS: 750.0000 mg | ORAL_TABLET | Freq: Two times a day (BID) | ORAL | 1 refills | Status: DC

## 2023-03-22 NOTE — Progress Notes (Signed)
VAD Clinic Note    ID:  Jonathan, Wood 12-Jan-1971, MRN [id number]  Location: Home  Provider location: [place] [facility] Type of Visit: Established patient  PCP: [facility]  Cardiologist:  Jonathan Meres, MD Primary HF: Dr Jonathan Wood   Chief Complaint: Heart Failure/LVAD  History of Present Illness:  Jonathan Wood is a 52 y.o. 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   GI issues: - 4/20 EGD with gastritis and nonbleeding duodenal ulcers, colonoscopy with 5 polyps removed. - 10/21 ETOH pancreatitis. Gallbladder ok. Resolved with medical management   Infection issues: - Admitted 12/20 for DL infection with overlying cellulitis and volume overload.  - Admitted 4/21 for DL infection with large subxiphoid abscess. Underwent several debridements and eventually a pec muscle flap. Wound cx + MSSA. Initially on ancef and rifabutin but expanded to cefipime then changed to IV ancef and po rifabutin.  - Readmitted 6/21 with superficial abscess and tachypnea. Superficial abscess I&D'd and packed. CT scan done and showed possible deeper fluid collection. Reviewed with TCTS who felt it was muscle flap. Sent to IR for possible drainage but u/s confirmed it was the muscle flap and no drainable fluid collection. Wound cx negative. ID saw and recommended continuing Keflex.  - Admitted 8/24 for DL infection He was admitted  for debridement and observation after. Wound packed with VASHE wet/dry dressing. Completed 3 days of IV cefepime while admitted. Discharged on po cipro 750 bid = conbtinue Keflex for previous MSSA  RV failure: - Admitted 8/21 with lactic acidosis due to cardiogenic shock. Started on 0.25 of milrinone and NE. Ramp echo on 8/13 with severe RV dysfunction. Speed increased 5600-> 6300. Tolerated ok. Repeat ramp echo on 8/20 speed increased to 6500.  Diuresed over 40 pounds.     Here for 2 month f/u. Feels great. 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 Denies orthopnea or PND. No fevers or chills. Minimal drainage from driveline. No bleeding, melena or neuro symptoms. No VAD alarms. Taking all meds as prescribed.     VAD Indication: Destination Therapy    VAD interrogation & Equipment Management: Speed: 6500 Flow: 6 Power: 5.8w    PI: 3 Hct: 32   Alarms: none Events: >50 daily   Primary Controller:  Replace back up battery in 22 months Back up controller: Replace back up battery in 8 months   Annual Equipment Maintenance on UBC/PM was performed 10/25/22    I reviewed the LVAD parameters from today and compared the results to the patient's prior recorded data.  LVAD interrogation was NEGATIVE for significant power changes, NEGATIVE for clinical alarms and STABLE for PI events/speed drops. No programming changes were made and pump is functioning within specified parameters.   Past Medical History:  Diagnosis Date   Asthma    CHF (congestive heart failure) (HCC)    a. 09/2016: EF 20-25% with cath showing normal cors   GERD (gastroesophageal reflux disease)    History of hiatal hernia    LVAD (left ventricular assist device) present (HCC)    OSA on CPAP 09/06/2018   Severe OSA with AHI 68/hr on CPAP at 12cm H2O   Past Surgical History:  Procedure Laterality Date   APPLICATION OF A-CELL OF CHEST/ABDOMEN N/A 08/24/2019   Procedure: Application Of A-Cell Of Chest/Abdomen;  Surgeon: Jonathan Perna, MD;  Location: [facility] OR;  Service: Thoracic;  Laterality: N/A;   APPLICATION OF A-CELL OF CHEST/ABDOMEN N/A 09/05/2019   Procedure: Application Of A-Cell Of Chest/Abdomen;  Surgeon: Jonathan Form, DO;  Location: [facility] OR;  Service: Plastics;  Laterality: N/A;   APPLICATION OF A-CELL OF CHEST/ABDOMEN  09/03/2019   Procedure: Application Of A-Cell Of Chest/Abdomen;  Surgeon: Jonathan Perna, MD;  Location: [facility] OR;  Service:  Thoracic;;   APPLICATION OF WOUND VAC N/A 08/22/2019   Procedure: Debridement, Irrigation and Packing of Abdominal Incision.;  Surgeon: Jonathan Perna, MD;  Location: [facility] OR;  Service: Thoracic;  Laterality: N/A;   APPLICATION OF WOUND VAC N/A 08/24/2019   Procedure: Irrigation and Debridement with WOUND VAC APPLICATION;  Surgeon: Jonathan Perna, MD;  Location: [facility] OR;  Service: Thoracic;  Laterality: N/A;   APPLICATION OF WOUND VAC N/A 08/30/2019   Procedure: APPLICATION OF WOUND VAC;  Surgeon: Jonathan Perna, MD;  Location: [facility] OR;  Service: Thoracic;  Laterality: N/A;   APPLICATION OF WOUND VAC N/A 09/03/2019   Procedure: WOUND VAC CHANGE;  Surgeon: Jonathan Perna, MD;  Location: [facility] OR;  Service: Thoracic;  Laterality: N/A;   BIOPSY  08/23/2018   Procedure: BIOPSY;  Surgeon: Jonathan Wood., MD;  Location: [facility] ENDOSCOPY;  Service: Gastroenterology;;   COLONOSCOPY N/A 08/23/2018   Procedure: COLONOSCOPY;  Surgeon: Jonathan Wood., MD;  Location: [facility] ENDOSCOPY;  Service: Gastroenterology;  Laterality: N/A;   ENTEROSCOPY N/A 08/23/2018   Procedure: ENTEROSCOPY;  Surgeon: Jonathan Wood., MD;  Location: [facility] ENDOSCOPY;  Service: Gastroenterology;  Laterality: N/A;   HEMOSTASIS CLIP PLACEMENT  08/23/2018   Procedure: HEMOSTASIS CLIP PLACEMENT;  Surgeon: Jonathan Wood., MD;  Location: [facility] ENDOSCOPY;  Service: Gastroenterology;;   IABP INSERTION N/A 09/05/2017   Procedure: IABP INSERTION;  Surgeon: Jonathan Patty, MD;  Location: [facility] INVASIVE CV LAB;  Service: Cardiovascular;  Laterality: N/A;   INSERTION OF IMPLANTABLE LEFT VENTRICULAR ASSIST DEVICE N/A 09/06/2017   Procedure: INSERTION OF IMPLANTABLE LEFT VENTRICULAR ASSIST DEVICE - HM3;  Surgeon: Jonathan Perna, MD;  Location: [facility] OR;  Service: Open Heart Surgery;  Laterality: N/A;  HM3   MUSCLE FLAP CLOSURE N/A 09/05/2019   Procedure: MUSCLE FLAP CLOSURE;  Surgeon: Jonathan Form, DO;  Location: [facility] OR;  Service:  Plastics;  Laterality: N/A;   NASAL FRACTURE SURGERY  1987   POLYPECTOMY  08/23/2018   Procedure: POLYPECTOMY;  Surgeon: Mansouraty, Netty Wood., MD;  Location: [facility] ENDOSCOPY;  Service: Gastroenterology;;   RIGHT HEART CATH N/A 08/30/2017   Procedure: RIGHT HEART CATH;  Surgeon: Jonathan Patty, MD;  Location: [facility] INVASIVE CV LAB;  Service: Cardiovascular;  Laterality: N/A;   RIGHT/LEFT HEART CATH AND CORONARY ANGIOGRAPHY N/A 10/08/2016   Procedure: Right/Left Heart Cath and Coronary Angiography;  Surgeon: Orpah Cobb, MD;  Location: [facility] INVASIVE CV LAB;  Service: Cardiovascular;  Laterality: N/A;   STERNAL WOUND DEBRIDEMENT N/A 08/20/2019   Procedure: DEBRIDEMENT OF LVAD DRIVELINE TUNNEL;  Surgeon: Jonathan Perna, MD;  Location: [facility] OR;  Service: Thoracic;  Laterality: N/A;   STERNAL WOUND DEBRIDEMENT N/A 09/05/2019   Procedure: DEBRIDEMENT AND CLOSURE OF ABDOMINAL WOUND;  Surgeon: Jonathan Form, DO;  Location: [facility] OR;  Service: Plastics;  Laterality: N/A;   STERNAL WOUND DEBRIDEMENT N/A 01/13/2023   Procedure: VAD TUNNEL WOUND DEBRIDEMENT;  Surgeon: Lovett Sox, MD;  Location: [facility] OR;  Service: Thoracic;  Laterality: N/A;   SUBMUCOSAL TATTOO INJECTION  08/23/2018   Procedure: SUBMUCOSAL TATTOO INJECTION;  Surgeon: Corliss Parish  Montez Hageman., MD;  Location: [facility] ENDOSCOPY;  Service: Gastroenterology;;   TEE WITHOUT CARDIOVERSION N/A 09/06/2017   Procedure: TRANSESOPHAGEAL ECHOCARDIOGRAM (TEE);  Surgeon: Donata Clay, Theron Arista, MD;  Location: [facility] OR;  Service: Open Heart Surgery;  Laterality: N/A;   WOUND DEBRIDEMENT N/A 08/30/2019   Procedure: Debridement Abdominal Wound;  Surgeon: Jonathan Perna, MD;  Location: [facility] OR;  Service: Thoracic;  Laterality: N/A;     Current Outpatient Medications  Medication Sig Dispense Refill   doxazosin (CARDURA) 2 MG tablet Take 0.5 tablets (1 mg total) by mouth at bedtime. 45 tablet 3   ENTRESTO 97-103 MG TAKE 1 TABLET BY MOUTH TWICE DAILY 60 tablet 6   gabapentin  (NEURONTIN) 300 MG capsule TAKE ONE CAPSULE BY MOUTH THREE TIMES DAILY AT 9am, 3pm, AND 9pm 90 capsule 5   pantoprazole (PROTONIX) 40 MG tablet Take 1 tablet (40 mg total) by mouth 2 (two) times daily. 180 tablet 3   sildenafil (REVATIO) 20 MG tablet Take 1 tablet (20 mg total) by mouth 3 (three) times daily. 90 tablet 11   spironolactone (ALDACTONE) 25 MG tablet Take 0.5 tablets (12.5 mg total) by mouth daily. 45 tablet 6   warfarin (COUMADIN) 5 MG tablet Take 1.5 tablets (7.5 mg total) by mouth daily at 4 PM. 7.5 mg (1.5 tab) Monday and Friday, 5 mg (1 tablet) all other days OR as directed by HF clinic 90 tablet 5   cephALEXin (KEFLEX) 500 MG capsule Take 1 capsule (500 mg total) by mouth 3 (three) times daily. (Patient not taking: Reported on 03/22/2023) 90 capsule 11   ciprofloxacin (CIPRO) 750 MG tablet Take 1 tablet (750 mg total) by mouth 2 (two) times daily. 60 tablet 1   enoxaparin (LOVENOX) 40 MG/0.4ML injection Inject 0.4 mLs (40 mg total) into the skin every 12 (twelve) hours. (Patient not taking: Reported on 03/22/2023) 4 mL 0   No current facility-administered medications for this encounter.    Allergies:   Patient has no known allergies.   Social History:  The patient  reports that he has quit smoking. His smoking use included cigarettes. He has a 12.5 pack-year smoking history. He has never used smokeless tobacco. He reports that he does not currently use alcohol. He reports that he does not currently use drugs after having used the following drugs: Marijuana.   Family History:  The patient's family history includes Alcohol abuse in his cousin; Hypertension in his father.   ROS:  Please see the history of present illness.   All other systems are personally reviewed and negative.    Vital Signs: Doppler: 110 Automatic BP: 115/97 (105) HR: 68  SPO2: 97   Weight: 217.6 lbs w/ eqt Last wt: 208.1 lbs w/ equip   Exam: General:  NAD.  HEENT: normal  Neck: supple. JVP not  elevated.  Carotids 2+ bilat; no bruits. No lymphadenopathy or thryomegaly appreciated. Cor: LVAD hum.  Lungs: Clear. Abdomen: soft, nontender, non-distended. No hepatosplenomegaly. No bruits or masses. Good bowel sounds. Driveline site clean but only partially incorporated Anchor in place.  Extremities: no cyanosis, clubbing, rash. Warm no edema  Neuro: alert & oriented x 3. No focal deficits. Moves all 4 without problem   Recent Labs: 01/13/2023: ALT 14 03/22/2023: BUN 11; Creatinine, Ser 1.00; Hemoglobin 10.4; Platelets 174; Potassium 4.1; Sodium 141    Wt Readings from Last 3 Encounters:  01/15/23 94.3 kg (207 lb 14.3 oz)  01/07/23 96.2 kg (212 lb)  10/25/22 96.3 kg (212 lb 6.4  oz)      ASSESSMENT AND PLAN:  1. 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 Treated with milrinone and then weaned off.  Diuresed 40 pounds. VAD speed increased - He continues to do well from HF standpoint. NYHA I today - Volume status looks good. Still having PI events. Encouraged to keep fluid intake up  - Continue Entresto and spiro.  - We have recently been discussing transplant. He continues to be very motivated. Now has quit smoking x 4 months.  Will need to start checking monthly cotinine levels   3. HM3 LVAD 09/06/2017.  - VAD interrogated personally. Parameters stable. - LDH 231 - INR 2.5 Discussed warfarin dosing with PharmD personally. - Off ASA with PUD.  - DL site with some scant drainage. Site not fully incorporated. D/w Dr. Donata Clay and ID team. Now off doxy/cipro. Will place patient on suppressive Keflex. Low threshold to add back cipro,   4.  Essential HTN - Blood pressure well controlled. Continue current regimen.  5. H/o ETOH pancreatitis, 11/21 - remains quit from drinking x several years (was stressed about his son)  6. Recurrent DL infection with subxiphoid abscess - s/p  debridement and pec flap in 4/21 - see above. Restartt Keflex  7.  OSA - sleep study with very severe OSA (AHI 69/hr) - He has refused therapy multiple times but seems to have improved with weight loss and ETOH cessation.   8.  H/o GI bleed  - 08/2018 EGD showed gastritis and nonbleeding duodenal ulcers, colonoscopy with 5 polyps removed.  - Remains on PPI. No recent GI bleeding - hgb stable at 11.5 -> 10.4  Will need to follow  9. Tobacco Abuse  - has now quit for 4 months.  - will start checking monthly cotinine levels at next visit  I spent a total of 45 minutes today: 1) reviewing the patient's medical records including previous charts, labs and recent notes from other providers; 2) examining the patient and counseling them on their medical issues/explaining the plan of care; 3) adjusting meds as needed and 4) ordering lab work or other needed tests.   Signed, Jonathan Meres, MD  03/22/2023 10:48 AM  [facility] [address] [facility] [place] [address] [phone] (office) [phone] (fax)

## 2023-03-22 NOTE — Progress Notes (Signed)
Patient presents for 2 month f/u  in VAD clinic today alone. Denies any issues with VAD or equipment.   Pt states he is feeling great. Denies lightheadedness, dizziness, falls, heart failure symptoms, and signs of bleeding.   Pt tells me that he hasn't taken any antibiotics since Saturday. He has completed the doxy and cipro. D/w ID, will have pt restart Kelfex as Cipro was for 2 months only. Per ID pt will need to restart Cipro if the drainage returns. Will get pt an appt with ID.  Vital Signs: Doppler: 110 Automatic BP: 115/97 (105) HR: 68  SPO2: 97   Weight: 217.6 lbs w/ eqt Last wt: 208.1 lbs w/ equip   VAD Indication: Destination Therapy    VAD interrogation & Equipment Management: Speed: 6500 Flow: 6 Power: 5.8w    PI: 3 Hct: 32  Alarms: none Events: >50 daily  Primary Controller:  Replace back up battery in 22 months Back up controller: Replace back up battery in 8 months  Annual Equipment Maintenance on UBC/PM was performed 10/25/22   I reviewed the LVAD parameters from today and compared the results to the patient's prior recorded data.  LVAD interrogation was NEGATIVE for significant power changes, NEGATIVE for clinical alarms and STABLE for PI events/speed drops. No programming changes were made and pump is functioning within specified parameters.   Exit Site Care: Drive line is being maintained weekly by VAD coordinators. Existing VAD dressing removed and site care performed using sterile technique. Drive line exit site cleaned with Chlora prep applicators x 2, RINSED WITH SALINE allowed to dry, and gauze dressing reapplied along with two Vashe moistened 2 x 2 around driveline. Anchor reapplied x 2.  Drive line exit site partially incorporated, the velour is fully implanted at exit site. Site does not tunnel.  Scant amount of brown drainage. No foul odor, erythema, or tenderness. Pt denies fever or chills. Pt informed that he will need to come to clinic on Tuesday for a  dressing change to reassess. Pt given 7 daily dressings and 10 anchors.      Device: none  BP & Labs:  Doppler 110 - reflecting modified systolic   Hgb 10.4 - No S/S of bleeding. Specifically denies melena/BRBPR or nosebleeds.    LDH 231 - established baseline of 200-300. Denies tea-colored urine. No power elevations noted on interrogation.   Patient Instructions:  Restart Keflex 500 mg 3x a day Return to clinic for dressing change on Tuesday Return to clinic in 2 mo to see Dr [name]   Carlton Adam RN VAD Coordinator  Office: [phone]  24/7 Pager: [phone]

## 2023-03-22 NOTE — Patient Instructions (Addendum)
Restart Keflex 500 mg 3x a day Return to clinic for dressing change on Tuesday Return to clinic in 2 mo to see Dr [name]

## 2023-03-23 ENCOUNTER — Other Ambulatory Visit (HOSPITAL_COMMUNITY): Payer: Self-pay

## 2023-03-23 NOTE — Telephone Encounter (Signed)
Advanced Heart Failure Patient Advocate Encounter  Prior Authorization for Sildenafil has been approved.    PA#  [id number] Effective dates: 03/15/23 through 05/16/24  Archer Asa, CPhT

## 2023-03-24 ENCOUNTER — Telehealth (INDEPENDENT_AMBULATORY_CARE_PROVIDER_SITE_OTHER): Payer: 59 | Admitting: Infectious Diseases

## 2023-03-24 DIAGNOSIS — T827XXA Infection and inflammatory reaction due to other cardiac and vascular devices, implants and grafts, initial encounter: Secondary | ICD-10-CM

## 2023-03-24 DIAGNOSIS — A498 Other bacterial infections of unspecified site: Secondary | ICD-10-CM | POA: Diagnosis not present

## 2023-03-24 NOTE — Telephone Encounter (Signed)
Notified by Jonathan Wood Tuesday 11/5 that Jonathan Wood was in for wound check and regular follow up at VAD Clinic.   He stopped all antibiotics after Saturday 11-2 (was on cipro and cephalexin) and needed further instructions on what to do with treatment.   He has a h/o mssa bacteremia due to sternal abscess in the past and needs indefinite suppression with anti-staphylococcal option --> recommend to refill the cephalexin 500 mg TID for now, we can get him on to cefadroxil longer term 500 mg BID to help with dose scheme.   As far as the cipro goes - with chart review he was hospitalized in August 2024 with secondary infection of pseudomonas. He was recommended 2 months of ciprofloxacin 750 mg BID until follow up which was around the end of October 2024.   He has no showed multiple ID appointments and despite attempts to reach out to him via phone call and mychart he has not made a follow up appointment with our team.   I reviewed his last few wound care visits and his exit site looks very good.     I think we can stop the cipro and follow him clinically to see how he does. Pseudomonas is with higher risk for acquiring resistance to antibiotics and would like to keep options for him to treat. I don't think we need to implement attempt at indefinite suppression for this given the wound has healed in so nicely.   He will return to VAD clinic for close follow up on wound care - he has on demand access to cipro in the event his drainage, induration or tunneling returns.   Will, again, try to reach him to schedule an ID appointment for regular FU.  Can be virtual   Total encounter time 31 minutes   Jonathan Alberts, MSN, NP-C [facility] Infectious Disease [facility]  [email or web address] Pager: [phone] Office: [phone] [facility] Main Line: [phone] *Secure Chat Communication Welcome

## 2023-03-25 ENCOUNTER — Other Ambulatory Visit (HOSPITAL_COMMUNITY): Payer: Self-pay | Admitting: Unknown Physician Specialty

## 2023-03-25 DIAGNOSIS — Z95811 Presence of heart assist device: Secondary | ICD-10-CM

## 2023-03-25 DIAGNOSIS — Z7901 Long term (current) use of anticoagulants: Secondary | ICD-10-CM

## 2023-03-28 ENCOUNTER — Telehealth: Payer: Self-pay

## 2023-03-28 NOTE — Telephone Encounter (Signed)
Called patient in regards to scheduling a 2-3 week f/u with Rexene Alberts or Dr. Drue Second. No answer and left VM.

## 2023-03-29 ENCOUNTER — Ambulatory Visit (HOSPITAL_COMMUNITY): Payer: Self-pay | Admitting: Pharmacist

## 2023-03-29 ENCOUNTER — Ambulatory Visit (HOSPITAL_COMMUNITY)
Admission: RE | Admit: 2023-03-29 | Discharge: 2023-03-29 | Disposition: A | Discharge status: Home / Self Care | Payer: 59 | Source: Ambulatory Visit | Attending: Cardiology | Admitting: Cardiology

## 2023-03-29 DIAGNOSIS — Z95811 Presence of heart assist device: Secondary | ICD-10-CM | POA: Insufficient documentation

## 2023-03-29 DIAGNOSIS — Z4509 Encounter for adjustment and management of other cardiac device: Secondary | ICD-10-CM | POA: Insufficient documentation

## 2023-03-29 DIAGNOSIS — Z7901 Long term (current) use of anticoagulants: Secondary | ICD-10-CM | POA: Diagnosis not present

## 2023-03-29 LAB — PROTIME-INR
INR: 2.5 — ABNORMAL HIGH (ref 0.8–1.2)
Prothrombin Time: 27.3 s — ABNORMAL HIGH (ref 11.4–15.2)

## 2023-03-29 NOTE — Progress Notes (Addendum)
Patient presents for INR & dressing change in VAD clinic today alone. Denies any issues with VAD or equipment.   Pt has completed courses of Doxy and Cipro. Per ID pt will need to restart Cipro if the drainage returns. Currently on Keflex 500 mg TID. Pt has ID f/u appt 11/21 at 9.  Exit Site Care: Drive line is being maintained weekly by VAD coordinators. Existing VAD dressing removed and site care performed using sterile technique. Drive line exit site cleaned with Chlora prep applicators x 2, RINSED WITH SALINE allowed to dry, and gauze dressing reapplied along with two Vashe moistened 2 x 2 around driveline. Anchor reapplied x 2.  Drive line exit site partially incorporated, the velour is fully implanted at exit site. Site does not tunnel. Scant amount of brown drainage on previous dressing. No foul odor, erythema, or tenderness. Pt denies fever or chills. Pt informed that he will need to come to clinic next Tuesday for a dressing change to reassess. Pt has adequate dressing supplies for home use.      Patient Instructions:  Coumadin dosing per Leotis Shames PharmD Return to clinic for dressing change next Tuesday  Alyce Pagan RN VAD Coordinator  Office: [phone]  24/7 Pager: [phone]

## 2023-03-29 NOTE — Addendum Note (Signed)
Encounter addended by: Bernita Raisin, RN on: 03/29/2023 11:22 AM  Actions taken: Clinical Note Signed

## 2023-03-29 NOTE — Addendum Note (Signed)
Encounter addended by: Bernita Raisin, RN on: 03/29/2023 3:26 PM  Actions taken: Charge Capture section accepted

## 2023-04-01 ENCOUNTER — Other Ambulatory Visit (HOSPITAL_COMMUNITY): Payer: Self-pay | Admitting: Unknown Physician Specialty

## 2023-04-01 ENCOUNTER — Telehealth (HOSPITAL_COMMUNITY): Payer: Self-pay | Admitting: Unknown Physician Specialty

## 2023-04-01 DIAGNOSIS — Z95811 Presence of heart assist device: Secondary | ICD-10-CM

## 2023-04-01 DIAGNOSIS — I5022 Chronic systolic (congestive) heart failure: Secondary | ICD-10-CM

## 2023-04-01 DIAGNOSIS — I420 Dilated cardiomyopathy: Secondary | ICD-10-CM

## 2023-04-01 DIAGNOSIS — Z7901 Long term (current) use of anticoagulants: Secondary | ICD-10-CM

## 2023-04-01 MED ORDER — PANTOPRAZOLE SODIUM 40 MG PO TBEC: 40.0000 mg | DELAYED_RELEASE_TABLET | Freq: Three times a day (TID) | ORAL | 3 refills | Status: DC

## 2023-04-01 NOTE — Telephone Encounter (Signed)
Pt called stating that his acid reflux has been bothering him recently. He is asking for an increase in his protonix to 3x a day as this seems to help. D/w Dr [name], order sent to [facility] for Protonix tid. Pt was also informed that he can take nexium OTC.  Carlton Adam RN, BSN VAD Coordinator 24/7 Pager [phone]

## 2023-04-04 ENCOUNTER — Other Ambulatory Visit (HOSPITAL_COMMUNITY): Payer: Self-pay | Admitting: Internal Medicine

## 2023-04-04 DIAGNOSIS — Z95811 Presence of heart assist device: Secondary | ICD-10-CM

## 2023-04-05 ENCOUNTER — Ambulatory Visit (HOSPITAL_COMMUNITY): Payer: Self-pay | Admitting: Pharmacist

## 2023-04-05 ENCOUNTER — Ambulatory Visit (HOSPITAL_COMMUNITY)
Admission: RE | Admit: 2023-04-05 | Discharge: 2023-04-05 | Disposition: A | Discharge status: Home / Self Care | Payer: 59 | Source: Ambulatory Visit | Attending: Cardiology | Admitting: Cardiology

## 2023-04-05 ENCOUNTER — Other Ambulatory Visit (HOSPITAL_COMMUNITY): Payer: Self-pay

## 2023-04-05 ENCOUNTER — Encounter (HOSPITAL_COMMUNITY): Payer: 59

## 2023-04-05 ENCOUNTER — Telehealth (HOSPITAL_COMMUNITY): Payer: Self-pay

## 2023-04-05 DIAGNOSIS — Z7901 Long term (current) use of anticoagulants: Secondary | ICD-10-CM | POA: Diagnosis not present

## 2023-04-05 DIAGNOSIS — Z95811 Presence of heart assist device: Secondary | ICD-10-CM | POA: Insufficient documentation

## 2023-04-05 LAB — PROTIME-INR
INR: 2.4 — ABNORMAL HIGH (ref 0.8–1.2)
Prothrombin Time: 26.2 s — ABNORMAL HIGH (ref 11.4–15.2)

## 2023-04-05 NOTE — Telephone Encounter (Signed)
Advanced Heart Failure Patient Advocate Encounter  Received notification via fax that prior auth is needed for Pantoprazole. Test billing indicates that this does require a DUR override, and rejects for refill too soon. Contacted [facility], pharmacist did confirm that PA is not needed, and refill is not due until December.  Burnell Blanks, CPhT Rx Patient Advocate Phone: [phone]

## 2023-04-05 NOTE — Progress Notes (Signed)
Patient presents for INR & dressing change in VAD clinic today alone. Denies any issues with VAD or equipment.   Pt has completed courses of Doxy and Cipro. Per ID pt will need to restart Cipro if the drainage returns. Currently on Keflex 500 mg TID. Pt has ID f/u appt 11/21 at 9. Pt asked to switch to video visit. Rexene Alberts NP made aware.   Exit Site Care: Existing VAD dressing removed and site care performed using sterile technique. Drive line exit site cleaned with Chlora prep applicators x 2, allowed to dry, and Sorbaview dressing with Silverlon patch applied. Exit site healed and incorporated, the velour is fully implanted at exit site. No redness, tenderness, drainage, foul odor or rash noted. Drive line anchor re-applied x 2. 3 large tegaderms applied. Pt instructed he may shower.  Patient Instructions:  Coumadin dosing per Leotis Shames PharmD Return to clinic for dressing change next Tuesday  [name] RN,BSN VAD Coordinator  Office: [phone]  24/7 Pager: [phone]

## 2023-04-07 ENCOUNTER — Encounter: Payer: Self-pay | Admitting: Infectious Diseases

## 2023-04-07 ENCOUNTER — Telehealth: Payer: 59 | Admitting: Infectious Diseases

## 2023-04-07 ENCOUNTER — Other Ambulatory Visit (HOSPITAL_COMMUNITY): Payer: 59

## 2023-04-07 ENCOUNTER — Other Ambulatory Visit: Payer: Self-pay

## 2023-04-07 ENCOUNTER — Encounter (HOSPITAL_COMMUNITY): Payer: 59

## 2023-04-07 DIAGNOSIS — A498 Other bacterial infections of unspecified site: Secondary | ICD-10-CM

## 2023-04-07 DIAGNOSIS — B9561 Methicillin susceptible Staphylococcus aureus infection as the cause of diseases classified elsewhere: Secondary | ICD-10-CM

## 2023-04-07 DIAGNOSIS — R7881 Bacteremia: Secondary | ICD-10-CM

## 2023-04-07 DIAGNOSIS — T827XXA Infection and inflammatory reaction due to other cardiac and vascular devices, implants and grafts, initial encounter: Secondary | ICD-10-CM | POA: Diagnosis not present

## 2023-04-07 NOTE — Progress Notes (Signed)
Patient: Jonathan Wood  DOB: 03/09/1971 MRN: [id number] PCP: Jonathan Patty, MD  Referring Provider:    VIRTUAL CARE ENCOUNTER  I connected with Jonathan Wood on 04/07/23 at  9:00 AM EST by VIDEO and verified that I am speaking with the correct person using two identifiers.   I discussed the limitations, risks, security and privacy concerns of performing an evaluation and management service by telephone and the availability of in person appointments. I also discussed with the patient that there may be a patient responsible charge related to this service. The patient expressed understanding and agreed to proceed.  Patient Location: [facility]   Other Participants:   Provider Location: [facility] Office   Subjective   Subjective:    Discussed the use of AI scribe software for clinical note transcription with the patient, who gave verbal consent to proceed.  History of Present Illness   Jonathan Wood has a history of MSSA severe sternal infection with bacteremia in the setting of implanted HM3 LVAD.  In August of 2024 he had a secondary bacterial infection involving his exit site due to sensitive Pseudomonas. He has been on dual therapy of cephalexin TID and cipro BID up until about 2 weeks ago when we had him stop the ciprofloxacin to complete a 3 month course.  He reports that the infection site has healed completely, as confirmed by recent photographs. He has been allowed to resume regular hygiene practices, such as showering, which had been restricted for approximately two months due to the infection. He did have some trouble with abdominal pain taking the ciprofloxacin.   The patient has a history of cardiac issues, including the presence of a cardiac device. Given the severity of his previous infection and the risk of the bacteria infecting the cardiac device, the decision was made to continue the antibiotic treatment indefinitely. He has not had any trouble with  sternal surgical site and reports no fevers/chills or concern for systemic illness.   He is hopeful to begin process to become listed for heart transplant -   he reports cessation of both tobacco and cannabis use, with five months of abstinence from cigarettes and two weeks from vape.       Review of Systems  Constitutional:  Negative for chills and fever.  HENT:  Negative for tinnitus.   Eyes:  Negative for blurred vision and photophobia.  Respiratory:  Negative for cough and sputum production.   Cardiovascular:  Negative for chest pain.  Gastrointestinal:  Negative for diarrhea, nausea and vomiting.  Genitourinary:  Negative for dysuria.  Skin:  Negative for rash.  Neurological:  Negative for headaches.    Past Medical History:  Diagnosis Date   Asthma    CHF (congestive heart failure) (HCC)    a. 09/2016: EF 20-25% with cath showing normal cors   GERD (gastroesophageal reflux disease)    History of hiatal hernia    LVAD (left ventricular assist device) present (HCC)    OSA on CPAP 09/06/2018   Severe OSA with AHI 68/hr on CPAP at 12cm H2O    Outpatient Medications Prior to Visit  Medication Sig Dispense Refill   cephALEXin (KEFLEX) 500 MG capsule Take 1 capsule (500 mg total) by mouth 3 (three) times daily. 90 capsule 11   ENTRESTO 97-103 MG TAKE 1 TABLET BY MOUTH TWICE DAILY 60 tablet 6   gabapentin (NEURONTIN) 300 MG capsule TAKE ONE CAPSULE BY MOUTH THREE TIMES DAILY AT 9am, 3pm, AND 9pm  90 capsule 5   pantoprazole (PROTONIX) 40 MG tablet Take 1 tablet (40 mg total) by mouth in the morning, at noon, and at bedtime. 180 tablet 3   sildenafil (REVATIO) 20 MG tablet Take 1 tablet (20 mg total) by mouth 3 (three) times daily. 90 tablet 11   spironolactone (ALDACTONE) 25 MG tablet Take 0.5 tablets (12.5 mg total) by mouth daily. 45 tablet 6   warfarin (COUMADIN) 5 MG tablet Take 1.5 tablets (7.5 mg total) by mouth daily at 4 PM. 7.5 mg (1.5 tab) Monday and Friday, 5 mg (1  tablet) all other days OR as directed by HF clinic 90 tablet 5   doxazosin (CARDURA) 2 MG tablet Take 0.5 tablets (1 mg total) by mouth at bedtime. 45 tablet 3   enoxaparin (LOVENOX) 40 MG/0.4ML injection Inject 0.4 mLs (40 mg total) into the skin every 12 (twelve) hours. (Patient not taking: Reported on 03/22/2023) 4 mL 0   No facility-administered medications prior to visit.     No Known Allergies  Social History   Tobacco Use   Smoking status: Former    Current packs/day: 0.50    Average packs/day: 0.5 packs/day for 25.0 years (12.5 ttl pk-yrs)    Types: Cigarettes   Smokeless tobacco: Never   Tobacco comments:    quit 08-16-2019  Vaping Use   Vaping status: Some Days  Substance Use Topics   Alcohol use: Not Currently    Comment: stopped drinking in April 2021   Drug use: Not Currently    Types: Marijuana    Family History  Problem Relation Age of Onset   Hypertension Father    Alcohol abuse Cousin    Colon cancer Neg Hx    Esophageal cancer Neg Hx    Inflammatory bowel disease Neg Hx    Liver disease Neg Hx    Pancreatic cancer Neg Hx    Rectal cancer Neg Hx    Stomach cancer Neg Hx    Diabetes Neg Hx        Objective   Objective:  There were no vitals filed for this visit. There is no height or weight on file to calculate BMI.  Physical Exam Constitutional:      Appearance: Normal appearance. He is not ill-appearing.  HENT:     Head: Normocephalic.     Mouth/Throat:     Mouth: Mucous membranes are moist.     Pharynx: Oropharynx is clear.  Eyes:     General: No scleral icterus. Pulmonary:     Effort: Pulmonary effort is normal.  Musculoskeletal:        General: Normal range of motion.     Cervical back: Normal range of motion.  Skin:    Coloration: Skin is not jaundiced or pale.  Neurological:     Mental Status: He is alert and oriented to person, place, and time.  Psychiatric:        Mood and Affect: Mood normal.        Judgment: Judgment  normal.     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 History of aggressive bacteria causing systemic infection. Long-term suppression with Keflex to prevent recurrence until hardware can be removed. -Continue Keflex 500 mg TID -Remain off Ciprofloxacin. -Plan for heart transplant evaluation in the  next six months. -Schedule follow-up video visit in January to assess status off pseudomonal coverage      Problem List Items Addressed This Visit       Unprioritized   Pseudomonas infection - Primary   Infection associated with driveline of left ventricular assist device (LVAD) (HCC)   Bacteremia due to methicillin susceptible Staphylococcus aureus (MSSA)     No orders of the defined types were placed in this encounter.   No orders of the defined types were placed in this encounter.   No follow-ups on file. - scheduled January 2025 virtual    Rexene Alberts, MSN, NP-C [facility]  [email or web address] Pager: [phone] Office: [phone] [facility] Main Line: [phone] *Secure Chat Communication Welcome

## 2023-04-11 ENCOUNTER — Ambulatory Visit (HOSPITAL_COMMUNITY)
Admission: RE | Admit: 2023-04-11 | Discharge: 2023-04-11 | Disposition: A | Discharge status: Home / Self Care | Payer: 59 | Source: Ambulatory Visit | Attending: Internal Medicine

## 2023-04-11 DIAGNOSIS — Z4801 Encounter for change or removal of surgical wound dressing: Secondary | ICD-10-CM | POA: Diagnosis present

## 2023-04-11 DIAGNOSIS — Z95811 Presence of heart assist device: Secondary | ICD-10-CM | POA: Diagnosis not present

## 2023-04-11 DIAGNOSIS — Z7901 Long term (current) use of anticoagulants: Secondary | ICD-10-CM | POA: Diagnosis not present

## 2023-04-11 NOTE — Progress Notes (Signed)
Patient presents for dressing change in VAD clinic today with his mother. Denies any issues with VAD or equipment.   Pt has completed courses of Doxy and Cipro. Per ID pt will need to restart Cipro if the drainage returns. Pt remains on Keflex 500mg  TID.  Exit Site Care: Existing VAD dressing removed and site care performed using sterile technique. Drive line exit site cleaned with Chlora prep applicators x 2, allowed to dry, and Sorbaview dressing with Silverlon patch applied. Exit site healed and incorporated, the velour is fully implanted at exit site. No redness, tenderness, drainage, foul odor or rash noted. Drive line anchor re-applied x 2. 3 large tegaderms applied.     Patient Instructions:  Return to clinic for dressing change next Tuesday  [name] RN,BSN VAD Coordinator  Office: [phone]  24/7 Pager: [phone]

## 2023-04-12 ENCOUNTER — Other Ambulatory Visit (HOSPITAL_COMMUNITY): Payer: 59

## 2023-04-13 ENCOUNTER — Other Ambulatory Visit (HOSPITAL_COMMUNITY): Payer: Self-pay | Admitting: Unknown Physician Specialty

## 2023-04-13 DIAGNOSIS — Z7901 Long term (current) use of anticoagulants: Secondary | ICD-10-CM

## 2023-04-13 DIAGNOSIS — Z95811 Presence of heart assist device: Secondary | ICD-10-CM

## 2023-04-19 ENCOUNTER — Other Ambulatory Visit (HOSPITAL_COMMUNITY): Payer: 59

## 2023-04-20 ENCOUNTER — Ambulatory Visit (HOSPITAL_COMMUNITY): Payer: Self-pay | Admitting: Pharmacist

## 2023-04-20 ENCOUNTER — Ambulatory Visit (HOSPITAL_COMMUNITY)
Admission: RE | Admit: 2023-04-20 | Discharge: 2023-04-20 | Disposition: A | Discharge status: Home / Self Care | Payer: 59 | Source: Ambulatory Visit | Attending: Cardiology | Admitting: Cardiology

## 2023-04-20 DIAGNOSIS — Z7901 Long term (current) use of anticoagulants: Secondary | ICD-10-CM | POA: Insufficient documentation

## 2023-04-20 DIAGNOSIS — Z4801 Encounter for change or removal of surgical wound dressing: Secondary | ICD-10-CM | POA: Diagnosis not present

## 2023-04-20 DIAGNOSIS — Z95811 Presence of heart assist device: Secondary | ICD-10-CM | POA: Insufficient documentation

## 2023-04-20 LAB — PROTIME-INR
INR: 2.4 — ABNORMAL HIGH (ref 0.8–1.2)
Prothrombin Time: 26 s — ABNORMAL HIGH (ref 11.4–15.2)

## 2023-04-20 NOTE — Progress Notes (Signed)
Patient presents for dressing change in VAD clinic today alone. Denies any issues with VAD or equipment.   Pt has completed courses of Doxy and Cipro. Per ID pt will need to restart Cipro if the drainage returns. Pt remains on Keflex 500mg  TID.  Exit Site Care: Existing VAD dressing removed and site care performed using sterile technique. Drive line exit site cleaned with Chlora prep applicators x 2, allowed to dry, and Sorbaview dressing with Silverlon patch applied. Exit site healed and incorporated, the velour is fully implanted at exit site. No redness, tenderness, drainage, foul odor or rash noted. Drive line anchor re-applied x 2. 3 large tegaderms applied. Provided with 4 weekly kits and 4 anchors for home use.    Patient Instructions:  Coumadin dosing per Leotis Shames PharmD Return to clinic for dressing change next Tuesday  Alyce Pagan RN VAD Coordinator  Office: [phone]  24/7 Pager: [phone]

## 2023-04-26 ENCOUNTER — Encounter (HOSPITAL_COMMUNITY): Payer: 59

## 2023-04-27 ENCOUNTER — Ambulatory Visit (HOSPITAL_COMMUNITY)
Admission: RE | Admit: 2023-04-27 | Discharge: 2023-04-27 | Disposition: A | Discharge status: Home / Self Care | Payer: 59 | Source: Ambulatory Visit | Attending: Cardiology | Admitting: Cardiology

## 2023-04-27 DIAGNOSIS — I10 Essential (primary) hypertension: Secondary | ICD-10-CM

## 2023-04-27 DIAGNOSIS — Z4509 Encounter for adjustment and management of other cardiac device: Secondary | ICD-10-CM | POA: Insufficient documentation

## 2023-04-27 DIAGNOSIS — I5022 Chronic systolic (congestive) heart failure: Secondary | ICD-10-CM

## 2023-04-27 DIAGNOSIS — Z7901 Long term (current) use of anticoagulants: Secondary | ICD-10-CM | POA: Insufficient documentation

## 2023-04-27 DIAGNOSIS — Z95811 Presence of heart assist device: Secondary | ICD-10-CM | POA: Diagnosis not present

## 2023-04-27 MED ORDER — SPIRONOLACTONE 25 MG PO TABS: 12.5000 mg | ORAL_TABLET | Freq: Every day | ORAL | 6 refills | Status: DC

## 2023-04-27 MED ORDER — DOXAZOSIN MESYLATE 2 MG PO TABS: 1.0000 mg | ORAL_TABLET | Freq: Every day | ORAL | 3 refills | Status: DC

## 2023-04-27 MED ORDER — ENTRESTO 97-103 MG PO TABS: 1.0000 | ORAL_TABLET | Freq: Two times a day (BID) | ORAL | 6 refills | Status: DC

## 2023-04-27 NOTE — Progress Notes (Signed)
Pt presents for dressing change in VAD clinic today alone. Denies any issues with VAD or equipment.   Pt has completed courses of Doxy and Cipro. Per ID pt will need to restart Cipro if the drainage returns. Pt remains on Keflex 500mg  TID.  Pt due for 5.5 year Intermacs by 05/09/23. Pt requesting to complete next week.  Exit Site Care: Existing VAD dressing removed and site care performed using sterile technique. Drive line exit site cleaned with Chlora prep applicators x 2, allowed to dry, and Sorbaview dressing with Silverlon patch applied. Exit site healed and incorporated, the velour is fully implanted at exit site. No redness, tenderness, drainage, foul odor or rash noted. Drive line anchor re-applied x 2. 3 large tegaderms applied.     Patient Instructions:  Return to clinic for dressing change/INR next Tuesday  [name] RN,BSN VAD Coordinator  Office: [phone]  24/7 Pager: [phone]

## 2023-04-29 ENCOUNTER — Other Ambulatory Visit (HOSPITAL_COMMUNITY): Payer: Self-pay | Admitting: *Deleted

## 2023-04-29 DIAGNOSIS — Z95811 Presence of heart assist device: Secondary | ICD-10-CM

## 2023-04-29 DIAGNOSIS — Z7901 Long term (current) use of anticoagulants: Secondary | ICD-10-CM

## 2023-05-03 ENCOUNTER — Inpatient Hospital Stay (HOSPITAL_COMMUNITY): Admission: RE | Admit: 2023-05-03 | Payer: 59 | Source: Ambulatory Visit

## 2023-05-04 ENCOUNTER — Ambulatory Visit (HOSPITAL_COMMUNITY)
Admission: RE | Admit: 2023-05-04 | Discharge: 2023-05-04 | Disposition: A | Discharge status: Home / Self Care | Payer: 59 | Source: Ambulatory Visit | Attending: Cardiology | Admitting: Cardiology

## 2023-05-04 ENCOUNTER — Ambulatory Visit (HOSPITAL_COMMUNITY): Payer: Self-pay | Admitting: Pharmacist

## 2023-05-04 DIAGNOSIS — Z7901 Long term (current) use of anticoagulants: Secondary | ICD-10-CM | POA: Diagnosis not present

## 2023-05-04 DIAGNOSIS — Z95811 Presence of heart assist device: Secondary | ICD-10-CM | POA: Insufficient documentation

## 2023-05-04 DIAGNOSIS — Z4801 Encounter for change or removal of surgical wound dressing: Secondary | ICD-10-CM | POA: Insufficient documentation

## 2023-05-04 LAB — PROTIME-INR
INR: 2.2 — ABNORMAL HIGH (ref 0.8–1.2)
Prothrombin Time: 25.1 s — ABNORMAL HIGH (ref 11.4–15.2)

## 2023-05-04 NOTE — Progress Notes (Signed)
Pt presents for dressing change in VAD clinic today alone. Denies any issues with VAD or equipment.   Pt has completed courses of Doxy and Cipro. Per ID pt will need to restart Cipro if the drainage returns. Pt remains on Keflex 500mg  TID.  Pt due for 5.5 year Intermacs by 05/09/23. Pt requesting to complete next week.  Exit Site Care: Existing VAD dressing removed and site care performed using sterile technique. Drive line exit site cleaned with Chlora prep applicators x 2, allowed to dry, and Sorbaview dressing with Silverlon patch applied. Exit site healed and incorporated, the velour is fully implanted at exit site. No redness, tenderness, drainage, foul odor or rash noted. Drive line anchor re-applied x 2. 3 large tegaderms applied.   Pt completed his 5.5 year Intermacs today. Patient completed 1800 feet during 6 minute walk test. Tolerated well.   Neurocognitive trail making completed correctly in 2 m 22 s.   [address] Cardiomyopathy, EQ-5D-3L and post-VAD QOL completed by the patient independently.   [place] Cardiomyopathy Questionnaire     05/04/2023   11:46 AM 09/03/2022   11:58 AM 04/21/2022    2:36 PM  KCCQ-12  1 a. Ability to shower/bathe Not at all limited Not at all limited Not at all limited  1 b. Ability to walk 1 block Not at all limited Not at all limited Not at all limited  1 c. Ability to hurry/jog Slightly limited Not at all limited Slightly limited  2. Edema feet/ankles/legs Never over the past 2 weeks Never over the past 2 weeks Never over the past 2 weeks  3. Limited by fatigue Never over the past 2 weeks Never over the past 2 weeks Never over the past 2 weeks  4. Limited by dyspnea Never over the past 2 weeks Never over the past 2 weeks Never over the past 2 weeks  5. Sitting up / on 3+ pillows Never over the past 2 weeks Never over the past 2 weeks Never over the past 2 weeks  6. Limited enjoyment of life Slightly limited Not limited at all Slightly limited   7. Rest of life w/ symptoms Mostly satisfied Completely satisfied Completely satisfied  8 a. Participation in hobbies Slightly limited Did not limit at all N/A, did not do for other reasons  8 b. Participation in chores Slightly limited Did not limit at all N/A, did not do for other reasons  8 c. Visiting family/friends N/A, did not do for other reasons Did not limit at all N/A, did not do for other reasons     Patient Instructions:  Return to clinic for dressing change/INR next Friday  [name] RN,BSN VAD Coordinator  Office: [phone]  24/7 Pager: [phone]

## 2023-05-13 ENCOUNTER — Ambulatory Visit (HOSPITAL_COMMUNITY)
Admission: RE | Admit: 2023-05-13 | Discharge: 2023-05-13 | Disposition: A | Discharge status: Home / Self Care | Payer: 59 | Source: Ambulatory Visit | Attending: Cardiology

## 2023-05-13 DIAGNOSIS — Z95811 Presence of heart assist device: Secondary | ICD-10-CM | POA: Insufficient documentation

## 2023-05-13 NOTE — Progress Notes (Signed)
Pt presents for dressing change in VAD clinic today alone. Denies any issues with VAD or equipment.   Pt has completed courses of Doxy and Cipro. Per ID pt will need to restart Cipro if the drainage returns. Pt remains on Keflex 500mg  TID.  Exit Site Care: Existing VAD dressing removed and site care performed using sterile technique. Drive line exit site cleaned with Chlora prep applicators x 2, allowed to dry, and Sorbaview dressing with Silverlon patch applied. Exit site healed and incorporated, the velour is fully implanted at exit site. No redness, tenderness, drainage, foul odor or rash noted. Drive line anchor re-applied x 2. 3 large tegaderms applied. Pt has adequate dressing supplies at home.    Patient Instructions:  Return to clinic for dressing change/INR next Friday  Alyce Pagan RN VAD Coordinator  Office: [phone]  24/7 Pager: [phone]

## 2023-05-20 ENCOUNTER — Other Ambulatory Visit (HOSPITAL_COMMUNITY): Payer: 59

## 2023-05-20 ENCOUNTER — Encounter (HOSPITAL_COMMUNITY): Payer: 59

## 2023-05-23 ENCOUNTER — Other Ambulatory Visit (HOSPITAL_COMMUNITY): Payer: Self-pay | Admitting: *Deleted

## 2023-05-23 DIAGNOSIS — I5022 Chronic systolic (congestive) heart failure: Secondary | ICD-10-CM

## 2023-05-23 DIAGNOSIS — Z95811 Presence of heart assist device: Secondary | ICD-10-CM

## 2023-05-23 DIAGNOSIS — Z7901 Long term (current) use of anticoagulants: Secondary | ICD-10-CM

## 2023-05-24 ENCOUNTER — Encounter (HOSPITAL_COMMUNITY): Payer: 59 | Admitting: Internal Medicine

## 2023-05-26 ENCOUNTER — Ambulatory Visit (HOSPITAL_COMMUNITY)
Admission: RE | Admit: 2023-05-26 | Discharge: 2023-05-26 | Disposition: A | Discharge status: Home / Self Care | Payer: 59 | Source: Ambulatory Visit | Attending: Internal Medicine | Admitting: Internal Medicine

## 2023-05-26 ENCOUNTER — Ambulatory Visit (HOSPITAL_COMMUNITY): Payer: Self-pay | Admitting: Pharmacist

## 2023-05-26 VITALS — BP 118/0 | HR 72 | Ht 74.0 in | Wt 223.3 lb

## 2023-05-26 DIAGNOSIS — T827XXA Infection and inflammatory reaction due to other cardiac and vascular devices, implants and grafts, initial encounter: Secondary | ICD-10-CM

## 2023-05-26 DIAGNOSIS — G4733 Obstructive sleep apnea (adult) (pediatric): Secondary | ICD-10-CM | POA: Insufficient documentation

## 2023-05-26 DIAGNOSIS — Z7901 Long term (current) use of anticoagulants: Secondary | ICD-10-CM

## 2023-05-26 DIAGNOSIS — Z8719 Personal history of other diseases of the digestive system: Secondary | ICD-10-CM | POA: Diagnosis not present

## 2023-05-26 DIAGNOSIS — Z9889 Other specified postprocedural states: Secondary | ICD-10-CM | POA: Diagnosis not present

## 2023-05-26 DIAGNOSIS — Z720 Tobacco use: Secondary | ICD-10-CM

## 2023-05-26 DIAGNOSIS — I110 Hypertensive heart disease with heart failure: Secondary | ICD-10-CM | POA: Insufficient documentation

## 2023-05-26 DIAGNOSIS — K859 Acute pancreatitis without necrosis or infection, unspecified: Secondary | ICD-10-CM | POA: Diagnosis not present

## 2023-05-26 DIAGNOSIS — Z8711 Personal history of peptic ulcer disease: Secondary | ICD-10-CM | POA: Diagnosis not present

## 2023-05-26 DIAGNOSIS — I10 Essential (primary) hypertension: Secondary | ICD-10-CM

## 2023-05-26 DIAGNOSIS — Z87891 Personal history of nicotine dependence: Secondary | ICD-10-CM | POA: Diagnosis not present

## 2023-05-26 DIAGNOSIS — I5022 Chronic systolic (congestive) heart failure: Secondary | ICD-10-CM | POA: Diagnosis not present

## 2023-05-26 DIAGNOSIS — Z95811 Presence of heart assist device: Secondary | ICD-10-CM

## 2023-05-26 LAB — CBC
HCT: 35.2 % — ABNORMAL LOW (ref 39.0–52.0)
Hemoglobin: 11.3 g/dL — ABNORMAL LOW (ref 13.0–17.0)
MCH: 27.8 pg (ref 26.0–34.0)
MCHC: 32.1 g/dL (ref 30.0–36.0)
MCV: 86.7 fL (ref 80.0–100.0)
Platelets: 190 10*3/uL (ref 150–400)
RBC: 4.06 MIL/uL — ABNORMAL LOW (ref 4.22–5.81)
RDW: 13.5 % (ref 11.5–15.5)
WBC: 4.5 10*3/uL (ref 4.0–10.5)
nRBC: 0 % (ref 0.0–0.2)

## 2023-05-26 LAB — PREALBUMIN: Prealbumin: 19 mg/dL (ref 18–38)

## 2023-05-26 LAB — COMPREHENSIVE METABOLIC PANEL
ALT: 13 U/L (ref 0–44)
AST: 17 U/L (ref 15–41)
Albumin: 3.7 g/dL (ref 3.5–5.0)
Alkaline Phosphatase: 63 U/L (ref 38–126)
Anion gap: 8 (ref 5–15)
BUN: 15 mg/dL (ref 6–20)
CO2: 23 mmol/L (ref 22–32)
Calcium: 9.2 mg/dL (ref 8.9–10.3)
Chloride: 108 mmol/L (ref 98–111)
Creatinine, Ser: 1.25 mg/dL — ABNORMAL HIGH (ref 0.61–1.24)
GFR, Estimated: >60 mL/min (ref >60)
Glucose, Bld: 121 mg/dL — ABNORMAL HIGH (ref 70–99)
Potassium: 3.8 mmol/L (ref 3.5–5.1)
Sodium: 139 mmol/L (ref 135–145)
Total Bilirubin: 0.8 mg/dL (ref 0.0–1.2)
Total Protein: 7.1 g/dL (ref 6.5–8.1)

## 2023-05-26 LAB — PROTIME-INR
INR: 1.8 — ABNORMAL HIGH (ref 0.8–1.2)
Prothrombin Time: 21.2 s — ABNORMAL HIGH (ref 11.4–15.2)

## 2023-05-26 LAB — LACTATE DEHYDROGENASE: LDH: 171 U/L (ref 98–192)

## 2023-05-26 NOTE — Progress Notes (Signed)
 Patient presents for 2 month f/u  in VAD clinic today alone. Denies any issues with VAD or equipment.   Pt states he is feeling great. Denies lightheadedness, dizziness, falls, heart failure symptoms, and signs of bleeding.   Pt tells me that he hasn't taken any antibiotics since Saturday. He has completed the doxy and cipro . D/w ID, will have pt restart Kelfex as Cipro  was for 2 months only. Per ID pt will need to restart Cipro  if the drainage returns. Will get pt an appt with ID.  Vital Signs: Doppler: 110 Automatic BP: 115/97 (105) HR: 68  SPO2: 97   Weight: 217.6 lbs w/ eqt Last wt: 208.1 lbs w/ equip   VAD Indication: Destination Therapy    VAD interrogation & Equipment Management: Speed: 6500 Flow: 6 Power: 5.8w    PI: 3 Hct: 32  Alarms: none Events: >50 daily  Primary Controller:  Replace back up battery in 22 months Back up controller: Replace back up battery in 8 months  Annual Equipment Maintenance on UBC/PM was performed 10/25/22   I reviewed the LVAD parameters from today and compared the results to the patient's prior recorded data.  LVAD interrogation was NEGATIVE for significant power changes, NEGATIVE for clinical alarms and STABLE for PI events/speed drops. No programming changes were made and pump is functioning within specified parameters.   Exit Site Care: Drive line is being maintained weekly by VAD coordinators. Existing VAD dressing removed and site care performed using sterile technique. Drive line exit site cleaned with Chlora prep applicators x 2, RINSED WITH SALINE allowed to dry, and gauze dressing reapplied along with two Vashe moistened 2 x 2 around driveline. Anchor reapplied x 2.  Drive line exit site partially incorporated, the velour is fully implanted at exit site. Site does not tunnel.  Scant amount of brown drainage. No foul odor, erythema, or tenderness. Pt denies fever or chills. Pt informed that he will need to come to clinic on Tuesday for a  dressing change to reassess. Pt given 7 daily dressings and 10 anchors.      Device: none  BP & Labs:  Doppler 110 - reflecting modified systolic   Hgb 10.4 - No S/S of bleeding. Specifically denies melena/BRBPR or nosebleeds.    LDH 231 - established baseline of 200-300. Denies tea-colored urine. No power elevations noted on interrogation.   Patient Instructions:  No change in medications Return to clinic for dressing change next thursday Return to clinic in 2 mo to see Dr Cherrie   Lauraine Ip RN VAD Coordinator  Office: [phone]  24/7 Pager: [phone]

## 2023-05-26 NOTE — Progress Notes (Signed)
 VAD Clinic Note    ID:  Jonathan Wood, Jonathan Wood 04-19-1971, MRN [id number]  Location: Home  Provider location: [place] [facility] Type of Visit: Established patient  PCP: [name]  Cardiologist:  Toribio Fuel, MD Primary HF: Dr Fuel   Chief Complaint: Heart Failure/LVAD  History of Present Illness:  Jonathan Wood is a 52 y.o. 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   GI issues: - 4/20 EGD with gastritis and nonbleeding duodenal ulcers, colonoscopy with 5 polyps removed. - 10/21 ETOH pancreatitis. Gallbladder ok. Resolved with medical management   Infection issues: - Admitted 12/20 for DL infection with overlying cellulitis and volume overload.  - Admitted 4/21 for DL infection with large subxiphoid abscess. Underwent several debridements and eventually a pec muscle flap. Wound cx + MSSA. Initially on ancef  and rifabutin  but expanded to cefipime then changed to IV ancef  and po rifabutin .  - Readmitted 6/21 with superficial abscess and tachypnea. Superficial abscess I&D'd and packed. CT scan done and showed possible deeper fluid collection. Reviewed with TCTS who felt it was muscle flap. Sent to IR for possible drainage but u/s confirmed it was the muscle flap and no drainable fluid collection. Wound cx negative. ID saw and recommended continuing Keflex .  - Admitted 8/24 for DL infection He was admitted  for debridement and observation after. Wound packed with VASHE wet/dry dressing. Completed 3 days of IV cefepime  while admitted. Discharged on po cipro  750 bid = conbtinue Keflex  for previous MSSA  RV failure: - Admitted 8/21 with lactic acidosis due to cardiogenic shock. Started on 0.25 of milrinone  and NE. Ramp echo on 8/13 with severe RV dysfunction. Speed increased 5600-> 6300. Tolerated ok. Repeat ramp echo on 8/20 speed increased to 6500.  Diuresed over 40 pounds.     Here for 2 month f/u. Doing really well. Completed doxy/cipro . Not on any abx. DL site ok. Has not smoked for 7 months. Denies orthopnea or PND. No fevers, chills or problems with driveline. No bleeding, melena or neuro symptoms. No VAD alarms. Taking all meds as prescribed.      VAD Indication: Destination Therapy    VAD interrogation & Equipment Management: Speed: 6500 Flow: 6 Power: 5.8w    PI: 3 Hct: 32   Alarms: none Events: >50 daily   Primary Controller:  Replace back up battery in 22 months Back up controller: Replace back up battery in 8 months   Annual Equipment Maintenance on UBC/PM was performed 10/25/22    I reviewed the LVAD parameters from today and compared the results to the patient's prior recorded data.  LVAD interrogation was NEGATIVE for significant power changes, NEGATIVE for clinical alarms and STABLE for PI events/speed drops. No programming changes were made and pump is functioning within specified parameters.   Past Medical History:  Diagnosis Date   Asthma    CHF (congestive heart failure) (HCC)    a. 09/2016: EF 20-25% with cath showing normal cors   GERD (gastroesophageal reflux disease)    History of hiatal hernia    LVAD (left ventricular assist device) present (HCC)    OSA on CPAP 09/06/2018   Severe OSA with AHI 68/hr on CPAP at 12cm H2O   Past Surgical History:  Procedure Laterality Date   APPLICATION OF A-CELL OF CHEST/ABDOMEN N/A 08/24/2019   Procedure: Application Of A-Cell Of Chest/Abdomen;  Surgeon: Fleeta  Hanford Coy, MD;  Location: [facility] OR;  Service: Thoracic;  Laterality: N/A;   APPLICATION OF A-CELL OF CHEST/ABDOMEN N/A 09/05/2019   Procedure: Application Of A-Cell Of Chest/Abdomen;  Surgeon: Lowery Estefana RAMAN, DO;  Location: [facility] OR;  Service: Plastics;  Laterality: N/A;   APPLICATION OF A-CELL OF CHEST/ABDOMEN  09/03/2019   Procedure: Application Of A-Cell Of Chest/Abdomen;  Surgeon: Fleeta Hanford Coy, MD;   Location: [facility] OR;  Service: Thoracic;;   APPLICATION OF WOUND VAC N/A 08/22/2019   Procedure: Debridement, Irrigation and Packing of Abdominal Incision.;  Surgeon: Fleeta Hanford Coy, MD;  Location: [facility] OR;  Service: Thoracic;  Laterality: N/A;   APPLICATION OF WOUND VAC N/A 08/24/2019   Procedure: Irrigation and Debridement with WOUND VAC APPLICATION;  Surgeon: Fleeta Hanford Coy, MD;  Location: [facility] OR;  Service: Thoracic;  Laterality: N/A;   APPLICATION OF WOUND VAC N/A 08/30/2019   Procedure: APPLICATION OF WOUND VAC;  Surgeon: Fleeta Hanford Coy, MD;  Location: [facility] OR;  Service: Thoracic;  Laterality: N/A;   APPLICATION OF WOUND VAC N/A 09/03/2019   Procedure: WOUND VAC CHANGE;  Surgeon: Fleeta Hanford Coy, MD;  Location: [facility] OR;  Service: Thoracic;  Laterality: N/A;   BIOPSY  08/23/2018   Procedure: BIOPSY;  Surgeon: Wilhelmenia Aloha Raddle., MD;  Location: [facility] ENDOSCOPY;  Service: Gastroenterology;;   COLONOSCOPY N/A 08/23/2018   Procedure: COLONOSCOPY;  Surgeon: Wilhelmenia Aloha Raddle., MD;  Location: [facility] ENDOSCOPY;  Service: Gastroenterology;  Laterality: N/A;   ENTEROSCOPY N/A 08/23/2018   Procedure: ENTEROSCOPY;  Surgeon: Wilhelmenia Aloha Raddle., MD;  Location: [facility] ENDOSCOPY;  Service: Gastroenterology;  Laterality: N/A;   HEMOSTASIS CLIP PLACEMENT  08/23/2018   Procedure: HEMOSTASIS CLIP PLACEMENT;  Surgeon: Wilhelmenia Aloha Raddle., MD;  Location: [facility] ENDOSCOPY;  Service: Gastroenterology;;   IABP INSERTION N/A 09/05/2017   Procedure: IABP INSERTION;  Surgeon: Cherrie Toribio SAUNDERS, MD;  Location: [facility] INVASIVE CV LAB;  Service: Cardiovascular;  Laterality: N/A;   INSERTION OF IMPLANTABLE LEFT VENTRICULAR ASSIST DEVICE N/A 09/06/2017   Procedure: INSERTION OF IMPLANTABLE LEFT VENTRICULAR ASSIST DEVICE - HM3;  Surgeon: Fleeta Hanford Coy, MD;  Location: [facility] OR;  Service: Open Heart Surgery;  Laterality: N/A;  HM3   MUSCLE FLAP CLOSURE N/A 09/05/2019   Procedure: MUSCLE FLAP CLOSURE;  Surgeon: Lowery Estefana RAMAN, DO;   Location: [facility] OR;  Service: Plastics;  Laterality: N/A;   NASAL FRACTURE SURGERY  1987   POLYPECTOMY  08/23/2018   Procedure: POLYPECTOMY;  Surgeon: Mansouraty, Aloha Raddle., MD;  Location: [facility] ENDOSCOPY;  Service: Gastroenterology;;   RIGHT HEART CATH N/A 08/30/2017   Procedure: RIGHT HEART CATH;  Surgeon: Cherrie Toribio SAUNDERS, MD;  Location: [facility] INVASIVE CV LAB;  Service: Cardiovascular;  Laterality: N/A;   RIGHT/LEFT HEART CATH AND CORONARY ANGIOGRAPHY N/A 10/08/2016   Procedure: Right/Left Heart Cath and Coronary Angiography;  Surgeon: Claudene Pacific, MD;  Location: [facility] INVASIVE CV LAB;  Service: Cardiovascular;  Laterality: N/A;   STERNAL WOUND DEBRIDEMENT N/A 08/20/2019   Procedure: DEBRIDEMENT OF LVAD DRIVELINE TUNNEL;  Surgeon: Fleeta Hanford Coy, MD;  Location: [facility] OR;  Service: Thoracic;  Laterality: N/A;   STERNAL WOUND DEBRIDEMENT N/A 09/05/2019   Procedure: DEBRIDEMENT AND CLOSURE OF ABDOMINAL WOUND;  Surgeon: Lowery Estefana RAMAN, DO;  Location: [facility] OR;  Service: Plastics;  Laterality: N/A;   STERNAL WOUND DEBRIDEMENT N/A 01/13/2023   Procedure: VAD TUNNEL WOUND DEBRIDEMENT;  Surgeon: Obadiah Coy, MD;  Location: [facility] OR;  Service: Thoracic;  Laterality: N/A;   SUBMUCOSAL TATTOO INJECTION  08/23/2018   Procedure: SUBMUCOSAL TATTOO INJECTION;  Surgeon: Wilhelmenia Aloha Raddle., MD;  Location: [facility] ENDOSCOPY;  Service: Gastroenterology;;   TEE WITHOUT CARDIOVERSION N/A 09/06/2017   Procedure: TRANSESOPHAGEAL ECHOCARDIOGRAM (TEE);  Surgeon: Fleeta Ochoa, Maude, MD;  Location: [facility] OR;  Service: Open Heart Surgery;  Laterality: N/A;   WOUND DEBRIDEMENT N/A 08/30/2019   Procedure: Debridement Abdominal Wound;  Surgeon: Fleeta Ochoa Maude, MD;  Location: [facility] OR;  Service: Thoracic;  Laterality: N/A;     Current Outpatient Medications  Medication Sig Dispense Refill   cephALEXin  (KEFLEX ) 500 MG capsule Take 1 capsule (500 mg total) by mouth 3 (three) times daily. 90 capsule 11   doxazosin  (CARDURA ) 2 MG tablet Take  0.5 tablets (1 mg total) by mouth at bedtime. 45 tablet 3   gabapentin  (NEURONTIN ) 300 MG capsule TAKE ONE CAPSULE BY MOUTH THREE TIMES DAILY AT 9am, 3pm, AND 9pm 90 capsule 5   pantoprazole  (PROTONIX ) 40 MG tablet Take 1 tablet (40 mg total) by mouth in the morning, at noon, and at bedtime. 180 tablet 3   sacubitril -valsartan  (ENTRESTO ) 97-103 MG Take 1 tablet by mouth 2 (two) times daily. 60 tablet 6   sildenafil  (REVATIO ) 20 MG tablet Take 1 tablet (20 mg total) by mouth 3 (three) times daily. 90 tablet 11   spironolactone  (ALDACTONE ) 25 MG tablet Take 0.5 tablets (12.5 mg total) by mouth daily. 45 tablet 6   warfarin (COUMADIN ) 5 MG tablet Take 1.5 tablets (7.5 mg total) by mouth daily at 4 PM. 7.5 mg (1.5 tab) Monday and Friday, 5 mg (1 tablet) all other days OR as directed by HF clinic 90 tablet 5   enoxaparin  (LOVENOX ) 40 MG/0.4ML injection Inject 0.4 mLs (40 mg total) into the skin every 12 (twelve) hours. (Patient not taking: Reported on 05/26/2023) 4 mL 0   No current facility-administered medications for this encounter.    Allergies:   Patient has no known allergies.   Social History:  The patient  reports that he has quit smoking. His smoking use included cigarettes. He has a 12.5 pack-year smoking history. He has never used smokeless tobacco. He reports that he does not currently use alcohol. He reports that he does not currently use drugs after having used the following drugs: Marijuana.   Family History:  The patient's family history includes Alcohol abuse in his cousin; Hypertension in his father.   ROS:  Please see the history of present illness.   All other systems are personally reviewed and negative.    Vital Signs: Doppler: 110 Automatic BP: 115/97 (105) - has not taken meds yet  HR: 68  SPO2: 97   Weight: 217.6 lbs w/ eqt Last wt: 208.1 lbs w/ equip   Exam: General:  NAD.  HEENT: normal  Neck: supple. JVP not elevated.  Carotids 2+ bilat; no bruits. No  lymphadenopathy or thryomegaly appreciated. Cor: LVAD hum.  Lungs: Clear. Abdomen: obese soft, nontender, non-distended. No hepatosplenomegaly. No bruits or masses. Good bowel sounds. Driveline site clean. Anchor in place.  Extremities: no cyanosis, clubbing, rash. Warm no edema  Neuro: alert & oriented x 3. No focal deficits. Moves all 4 without problem    Recent Labs: 01/13/2023: ALT 14 03/22/2023: BUN 11; Creatinine, Ser 1.00; Potassium 4.1; Sodium 141 05/26/2023: Hemoglobin 11.3; Platelets 190    Wt Readings from Last 3 Encounters:  03/22/23 98.7 kg (217 lb 9.6 oz)  01/15/23 94.3 kg (207 lb 14.3 oz)  01/07/23 96.2 kg (212 lb)  ASSESSMENT AND PLAN:  1. 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 Treated with milrinone  and then weaned off.  Diuresed 40 pounds. VAD speed increased - He continues to do well from HF standpoint. NYHA I  - Volume status looks good - Continue Entresto  and spiro.  - We have recently been discussing transplant. He continues to be very motivated. Now has quit smoking x 7 months.  Start checking monthly cotinine levels   3. HM3 LVAD 09/06/2017.  - VAD interrogated personally. Parameters stable. - LDH pending - INR 1.8 Goal 2.0-3.0 Discussed warfarin dosing with PharmD personally. - Off ASA with PUD.  - DL site looks good. Off doxy/cipro . Restart suppressive Keflex . Low threshold to add back cipro  if develops any drainage  4.  Essential HTN - BP elevated but has not taken am meds.  - asked him to take meds before appt next visit   5. H/o ETOH pancreatitis, 11/21 - remains quit from drinking x several years (was stressed about his son)  6. Recurrent DL infection with subxiphoid abscess - s/p debridement and pec flap in 4/21 - resolved. Completed doxy/cipro  - D/w ID. Restart Keflex    7.  OSA - sleep study with very severe OSA (AHI 69/hr) - He has  refused therapy multiple times but much improved with weight loss and ETOH cessation   8.  H/o GI bleed  - 08/2018 EGD showed gastritis and nonbleeding duodenal ulcers, colonoscopy with 5 polyps removed.  - Remains on PPI. No recent GI bleeding - hgb stable at 11.5 -> 10.4 -> 11.3   9. Tobacco Abuse  - has now quit for 7 months.  - will start checking monthly cotinine levels   I spent a total of 40 minutes today: 1) reviewing the patient's medical records including previous charts, labs and recent notes from other providers; 2) examining the patient and counseling them on their medical issues/explaining the plan of care; 3) adjusting meds as needed and 4) ordering lab work or other needed tests.    Signed, Toribio Fuel, MD  05/26/2023 10:36 AM  [facility] [address] [facility] [place] [address] [phone] (office) [phone] (fax)

## 2023-05-26 NOTE — Addendum Note (Signed)
 Encounter addended by: Lebron Quam, RN on: 05/26/2023 2:05 PM  Actions taken: Clinical Note Signed

## 2023-05-26 NOTE — Patient Instructions (Signed)
 No change in medications Return to clinic for dressing change next thursday Return to clinic in 2 mo to see Dr [name]

## 2023-05-26 NOTE — Progress Notes (Signed)
 Patient presents for 2 month f/u  in VAD clinic today alone. Denies any issues with VAD or equipment.   Pt states he is feeling great. Denies lightheadedness, dizziness, falls, heart failure symptoms, and signs of bleeding.   BP is up slightly today but pt has not taken any of his medications yet today.   He tells me that by the end of the month he will be ready for us  to start doing monthly nicotine  checks.  Vital Signs: Doppler: 118 Automatic BP: 114/98 (105) HR: 72 SPO2: 98   Weight: 223.3 lbs w/ eqt Last wt: 217.6 lbs w/ equip   VAD Indication: Destination Therapy    VAD interrogation & Equipment Management: Speed: 6500 Flow: 5.3 Power: 5.7w    PI: 2.4 Hct: 32  Alarms: none Events: >50 daily  Primary Controller:  Replace back up battery in 20 months Back up controller: Replace back up battery in 8 months-did not bring  Annual Equipment Maintenance on UBC/PM was performed 10/25/22   I reviewed the LVAD parameters from today and compared the results to the patient's prior recorded data.  LVAD interrogation was NEGATIVE for significant power changes, NEGATIVE for clinical alarms and STABLE for PI events/speed drops. No programming changes were made and pump is functioning within specified parameters.   Exit Site Care: Drive line is being maintained weekly by VAD coordinators. Existing VAD dressing removed and site care performed using sterile technique. Drive line exit site cleaned with Chlora prep applicators x 2, allowed to dry, and Sorbaview dressing with Silverlon patch applied. Exit site healed but only partially incorporated-because of this the pt was asked not to shower this week-only sponge baths. The velour is fully implanted at exit site. No redness, tenderness, drainage, foul odor or rash noted. Drive line anchor re-applied x 2. 3 large tegaderms applied. Pt has adequate dressing supplies at home. Pt denies fever or chills. Continue weekly dressing changes in VAD  clinic.  Device: none  BP & Labs:  Doppler 118 - reflecting modified systolic   Hgb 10.4 - No S/S of bleeding. Specifically denies melena/BRBPR or nosebleeds.    LDH 171 - established baseline of 200-300. Denies tea-colored urine. No power elevations noted on interrogation.   Patient Instructions:  No change in medications Return to clinic for dressing change next thursday Return to clinic in 2 mo to see Dr Cherrie  Lauraine Ip RN VAD Coordinator  Office: [phone]  24/7 Pager: [phone]

## 2023-05-26 NOTE — Addendum Note (Signed)
 Encounter addended by: Lebron Quam, RN on: 05/26/2023 11:19 AM  Actions taken: Vitals modified, Pend clinical note

## 2023-05-26 NOTE — Addendum Note (Signed)
 Encounter addended by: Lebron Quam, RN on: 05/26/2023 2:13 PM  Actions taken: Charge Capture section accepted

## 2023-05-31 ENCOUNTER — Ambulatory Visit (HOSPITAL_COMMUNITY)
Admission: RE | Admit: 2023-05-31 | Discharge: 2023-05-31 | Disposition: A | Discharge status: Home / Self Care | Payer: 59 | Source: Ambulatory Visit | Attending: Cardiology | Admitting: Cardiology

## 2023-05-31 DIAGNOSIS — Z95811 Presence of heart assist device: Secondary | ICD-10-CM | POA: Insufficient documentation

## 2023-05-31 DIAGNOSIS — Z4509 Encounter for adjustment and management of other cardiac device: Secondary | ICD-10-CM | POA: Diagnosis present

## 2023-05-31 NOTE — Progress Notes (Signed)
 Pt presents for dressing change in VAD clinic today alone. Denies any issues with VAD or equipment.   Pt has completed courses of Doxy and Cipro . Per ID pt will need to restart Cipro  if the drainage returns. Pt remains on Keflex  500mg  TID.  Exit Site Care: Existing VAD dressing removed and site care performed using sterile technique. Drive line exit site cleaned with Chlora prep applicators x 2, allowed to dry, and Sorbaview dressing with Silverlon patch applied. Exit site healed and incorporated, the velour is fully implanted at exit site. No redness, tenderness, drainage, foul odor or rash noted. Drive line anchor re-applied x 2. 3 large tegaderms applied. Pt has adequate dressing supplies at home.    Patient Instructions:  Return to clinic for dressing change/INR next Tuesday  Isaiah Knoll RN VAD Coordinator  Office: [phone]  24/7 Pager: [phone]

## 2023-06-02 ENCOUNTER — Telehealth: Payer: 59 | Admitting: Infectious Diseases

## 2023-06-02 ENCOUNTER — Encounter: Payer: Self-pay | Admitting: Infectious Diseases

## 2023-06-02 ENCOUNTER — Other Ambulatory Visit: Payer: Self-pay

## 2023-06-02 ENCOUNTER — Other Ambulatory Visit (HOSPITAL_COMMUNITY): Payer: 59

## 2023-06-02 ENCOUNTER — Other Ambulatory Visit (HOSPITAL_COMMUNITY): Payer: Self-pay | Admitting: Unknown Physician Specialty

## 2023-06-02 DIAGNOSIS — B965 Pseudomonas (aeruginosa) (mallei) (pseudomallei) as the cause of diseases classified elsewhere: Secondary | ICD-10-CM | POA: Diagnosis not present

## 2023-06-02 DIAGNOSIS — Z95811 Presence of heart assist device: Secondary | ICD-10-CM

## 2023-06-02 DIAGNOSIS — B9561 Methicillin susceptible Staphylococcus aureus infection as the cause of diseases classified elsewhere: Secondary | ICD-10-CM

## 2023-06-02 DIAGNOSIS — R7881 Bacteremia: Secondary | ICD-10-CM | POA: Diagnosis not present

## 2023-06-02 DIAGNOSIS — T827XXA Infection and inflammatory reaction due to other cardiac and vascular devices, implants and grafts, initial encounter: Secondary | ICD-10-CM

## 2023-06-02 DIAGNOSIS — A498 Other bacterial infections of unspecified site: Secondary | ICD-10-CM

## 2023-06-02 DIAGNOSIS — Z7901 Long term (current) use of anticoagulants: Secondary | ICD-10-CM

## 2023-06-02 NOTE — Progress Notes (Signed)
Patient: Jonathan Wood  DOB: Sep 25, 1970 MRN: [id number] PCP: Dolores Patty, MD  Referring Provider:    VIRTUAL CARE ENCOUNTER  I connected with Jonathan Wood on 06/02/23 at  9:45 AM EST by VIDEO and verified that I am speaking with the correct person using two identifiers.   I discussed the limitations, risks, security and privacy concerns of performing an evaluation and management service by telephone and the availability of in person appointments. I also discussed with the patient that there may be a patient responsible charge related to this service. The patient expressed understanding and agreed to proceed.  Patient Location: [facility]   Other Participants:   Provider Location: [facility] Office   Subjective   Subjective:    Discussed the use of AI scribe software for clinical note transcription with the patient, who gave verbal consent to proceed.  History of Present Illness   Jonathan Wood, a patient with a history of superficial pseudomonas infection involving LVAD exit site last year, has continued to do well off antibiotics. He has been following up regularly at the LVAD clinic and his recent site visit showed good progress. He has not needed to go back on Cipro antibiotic and is currently only taking Keflex. He has not reported any recurrence of drainage, pain, or swelling.  Jonathan Wood has been tolerating Keflex well, with no reported side effects such as nausea, diarrhea, yeast infections, or rashes. This medication was prescribed for a separate issue related to a sternal infection that had led to bacteremia.  Jonathan Wood's lab work has been satisfactory.       Review of Systems  Constitutional:  Negative for chills and fever.  HENT:  Negative for tinnitus.   Eyes:  Negative for blurred vision and photophobia.  Respiratory:  Negative for cough and sputum production.   Cardiovascular:  Negative for chest pain.  Gastrointestinal:  Negative for diarrhea,  nausea and vomiting.  Genitourinary:  Negative for dysuria.  Skin:  Negative for rash.  Neurological:  Negative for headaches.    Past Medical History:  Diagnosis Date   Asthma    CHF (congestive heart failure) (HCC)    a. 09/2016: EF 20-25% with cath showing normal cors   GERD (gastroesophageal reflux disease)    History of hiatal hernia    LVAD (left ventricular assist device) present (HCC)    OSA on CPAP 09/06/2018   Severe OSA with AHI 68/hr on CPAP at 12cm H2O    Outpatient Medications Prior to Visit  Medication Sig Dispense Refill   cephALEXin (KEFLEX) 500 MG capsule Take 1 capsule (500 mg total) by mouth 3 (three) times daily. 90 capsule 11   doxazosin (CARDURA) 2 MG tablet Take 0.5 tablets (1 mg total) by mouth at bedtime. 45 tablet 3   gabapentin (NEURONTIN) 300 MG capsule TAKE ONE CAPSULE BY MOUTH THREE TIMES DAILY AT 9am, 3pm, AND 9pm 90 capsule 5   pantoprazole (PROTONIX) 40 MG tablet Take 1 tablet (40 mg total) by mouth in the morning, at noon, and at bedtime. 180 tablet 3   sacubitril-valsartan (ENTRESTO) 97-103 MG Take 1 tablet by mouth 2 (two) times daily. 60 tablet 6   sildenafil (REVATIO) 20 MG tablet Take 1 tablet (20 mg total) by mouth 3 (three) times daily. 90 tablet 11   spironolactone (ALDACTONE) 25 MG tablet Take 0.5 tablets (12.5 mg total) by mouth daily. 45 tablet 6   warfarin (COUMADIN) 5 MG tablet Take 1.5 tablets (7.5 mg total)  by mouth daily at 4 PM. 7.5 mg (1.5 tab) Monday and Friday, 5 mg (1 tablet) all other days OR as directed by HF clinic 90 tablet 5   enoxaparin (LOVENOX) 40 MG/0.4ML injection Inject 0.4 mLs (40 mg total) into the skin every 12 (twelve) hours. (Patient not taking: Reported on 03/22/2023) 4 mL 0   No facility-administered medications prior to visit.     No Known Allergies  Social History   Tobacco Use   Smoking status: Former    Current packs/day: 0.50    Average packs/day: 0.5 packs/day for 25.0 years (12.5 ttl pk-yrs)     Types: Cigarettes   Smokeless tobacco: Never   Tobacco comments:    quit 08-16-2019  Vaping Use   Vaping status: Some Days  Substance Use Topics   Alcohol use: Not Currently    Comment: stopped drinking in April 2021   Drug use: Not Currently    Types: Marijuana    Family History  Problem Relation Age of Onset   Hypertension Father    Alcohol abuse Cousin    Colon cancer Neg Hx    Esophageal cancer Neg Hx    Inflammatory bowel disease Neg Hx    Liver disease Neg Hx    Pancreatic cancer Neg Hx    Rectal cancer Neg Hx    Stomach cancer Neg Hx    Diabetes Neg Hx        Objective   Objective:  There were no vitals filed for this visit. There is no height or weight on file to calculate BMI.  Physical Exam Constitutional:      Appearance: Normal appearance. He is not ill-appearing.  HENT:     Head: Normocephalic.     Mouth/Throat:     Mouth: Mucous membranes are moist.     Pharynx: Oropharynx is clear.  Eyes:     General: No scleral icterus. Pulmonary:     Effort: Pulmonary effort is normal.  Musculoskeletal:        General: Normal range of motion.     Cervical back: Normal range of motion.  Skin:    Coloration: Skin is not jaundiced or pale.  Neurological:     Mental Status: He is alert and oriented to person, place, and time.  Psychiatric:        Mood and Affect: Mood normal.        Judgment: Judgment normal.           Assessment & Plan:     Pseudomonas infection LVAD Exit Site -  Resolved with no recurrence of drainage, pain, or swelling. -Continue monitoring for any recurrence and report immediately if symptoms return.  Sternal infection with Bacteremia (MSSA) -  Bacteremia previously noted, currently on Keflex prophylaxis. No adverse effects reported. -Continue Keflex as long as the pump is in place. -Ensure patient does not run out of medication, with refills available until May.  Follow-up - No current issues reported. -Schedule a video visit  for May 27th, 2025 at 11:15 AM to review prescription and check on the site.        No orders of the defined types were placed in this encounter.   No orders of the defined types were placed in this encounter.   10/11/2023 next virtual appt to refill cephalexin and check in on site progress    Rexene Alberts, MSN, NP-C [facility]  [email or web address] Pager: [phone] Office: [phone] [facility] Main Line: [phone] *  [employer]

## 2023-06-05 ENCOUNTER — Other Ambulatory Visit (HOSPITAL_COMMUNITY): Payer: Self-pay

## 2023-06-05 DIAGNOSIS — I5022 Chronic systolic (congestive) heart failure: Secondary | ICD-10-CM

## 2023-06-05 DIAGNOSIS — Z95811 Presence of heart assist device: Secondary | ICD-10-CM

## 2023-06-05 DIAGNOSIS — I420 Dilated cardiomyopathy: Secondary | ICD-10-CM

## 2023-06-05 MED ORDER — PANTOPRAZOLE SODIUM 40 MG PO TBEC: 40.0000 mg | DELAYED_RELEASE_TABLET | Freq: Three times a day (TID) | ORAL | 3 refills | Status: DC

## 2023-06-07 ENCOUNTER — Telehealth (HOSPITAL_COMMUNITY): Payer: Self-pay | Admitting: *Deleted

## 2023-06-07 ENCOUNTER — Other Ambulatory Visit (HOSPITAL_COMMUNITY): Payer: 59

## 2023-06-07 NOTE — Telephone Encounter (Signed)
Attempted to call pt regarding missed appt this morning. His VM is not set up, therefore unable to leave a message regarding need to reschedule appt.   Alyce Pagan RN VAD Coordinator  Office: [phone]  24/7 Pager: [phone]

## 2023-06-09 ENCOUNTER — Ambulatory Visit (HOSPITAL_COMMUNITY)
Admission: RE | Admit: 2023-06-09 | Discharge: 2023-06-09 | Disposition: A | Discharge status: Home / Self Care | Payer: 59 | Source: Ambulatory Visit | Attending: Cardiology | Admitting: Cardiology

## 2023-06-09 ENCOUNTER — Ambulatory Visit (HOSPITAL_COMMUNITY): Payer: Self-pay | Admitting: Pharmacist

## 2023-06-09 DIAGNOSIS — Z95811 Presence of heart assist device: Secondary | ICD-10-CM | POA: Insufficient documentation

## 2023-06-09 DIAGNOSIS — Z7901 Long term (current) use of anticoagulants: Secondary | ICD-10-CM | POA: Diagnosis not present

## 2023-06-09 DIAGNOSIS — Z4509 Encounter for adjustment and management of other cardiac device: Secondary | ICD-10-CM | POA: Diagnosis present

## 2023-06-09 LAB — PROTIME-INR
INR: 2.4 — ABNORMAL HIGH (ref 0.8–1.2)
Prothrombin Time: 25.9 s — ABNORMAL HIGH (ref 11.4–15.2)

## 2023-06-09 NOTE — Progress Notes (Addendum)
Pt presents for dressing change in VAD clinic today alone. Denies any issues with VAD or equipment.   Pt has completed courses of Doxy and Cipro. Per ID pt will need to restart Cipro if the drainage returns. Pt remains on Keflex 500mg  TID.  Exit Site Care: Existing VAD dressing removed and site care performed using sterile technique. Drive line exit site cleaned with Chlora prep applicators x 2, allowed to dry, and Sorbaview dressing with Silverlon patch applied. Exit site healed and incorporated, the velour is fully implanted at exit site. Small amount of tissue underneath drive line that Cellerate was applied to assist in healing. It appears that the drive line may have been rubbing this area and caused mild irritation. Again site does not tunnel and is incorporated. No redness, tenderness, drainage, foul odor or rash noted. Drive line anchor re-applied x 2. 3 large tegaderms applied. Pt given pack of tegaderms for home use.     Patient Instructions:  Return to clinic for dressing change/INR next Tuesday  [name] RN,BSN VAD Coordinator  Office: [phone]  24/7 Pager: [phone]

## 2023-06-16 ENCOUNTER — Ambulatory Visit (HOSPITAL_COMMUNITY)
Admission: RE | Admit: 2023-06-16 | Discharge: 2023-06-16 | Disposition: A | Discharge status: Home / Self Care | Payer: 59 | Source: Ambulatory Visit | Attending: Internal Medicine | Admitting: Internal Medicine

## 2023-06-16 DIAGNOSIS — Z95811 Presence of heart assist device: Secondary | ICD-10-CM | POA: Insufficient documentation

## 2023-06-16 NOTE — Progress Notes (Signed)
Pt presents for dressing change in VAD clinic today alone. Denies any issues with VAD or equipment.   Pt has completed courses of Doxy and Cipro. Per ID pt will need to restart Cipro if the drainage returns. Pt remains on Keflex 500mg  TID.  Exit Site Care: Existing VAD dressing removed and site care performed using sterile technique. Drive line exit site cleaned with Chlora prep applicators x 2, allowed to dry, and Sorbaview dressing with Silverlon patch applied. Exit site healed and incorporated, the velour is fully implanted at exit site. Site does not tunnel and is incorporated. No redness, tenderness, drainage, foul odor or rash noted. Drive line anchor re-applied x 2. 3 large tegaderms applied. Pt given pack of tegaderms for home use. Pt instructed that it is ok to shower but he needs to clean his showerhead with bleach.    Patient Instructions:  Return to clinic for dressing change/INR next Friday  Carlton Adam RN,BSN VAD Coordinator  Office: [phone]  24/7 Pager: [phone]

## 2023-06-17 ENCOUNTER — Other Ambulatory Visit (HOSPITAL_COMMUNITY): Payer: Self-pay | Admitting: Unknown Physician Specialty

## 2023-06-17 DIAGNOSIS — Z95811 Presence of heart assist device: Secondary | ICD-10-CM

## 2023-06-17 DIAGNOSIS — Z7901 Long term (current) use of anticoagulants: Secondary | ICD-10-CM

## 2023-06-24 ENCOUNTER — Encounter (HOSPITAL_COMMUNITY): Payer: 59 | Admitting: Internal Medicine

## 2023-06-24 ENCOUNTER — Other Ambulatory Visit (HOSPITAL_COMMUNITY): Payer: 59

## 2023-06-27 ENCOUNTER — Ambulatory Visit (HOSPITAL_COMMUNITY)
Admission: RE | Admit: 2023-06-27 | Discharge: 2023-06-27 | Disposition: A | Discharge status: Home / Self Care | Payer: 59 | Source: Ambulatory Visit | Attending: Cardiology | Admitting: Cardiology

## 2023-06-27 ENCOUNTER — Ambulatory Visit (HOSPITAL_COMMUNITY): Payer: Self-pay | Admitting: Pharmacist

## 2023-06-27 DIAGNOSIS — Z7901 Long term (current) use of anticoagulants: Secondary | ICD-10-CM | POA: Insufficient documentation

## 2023-06-27 DIAGNOSIS — Z4801 Encounter for change or removal of surgical wound dressing: Secondary | ICD-10-CM | POA: Diagnosis present

## 2023-06-27 DIAGNOSIS — Z95811 Presence of heart assist device: Secondary | ICD-10-CM | POA: Insufficient documentation

## 2023-06-27 LAB — PROTIME-INR
INR: 3.1 — ABNORMAL HIGH (ref 0.8–1.2)
Prothrombin Time: 32.3 s — ABNORMAL HIGH (ref 11.4–15.2)

## 2023-06-27 NOTE — Progress Notes (Signed)
 Pt presents for dressing change and INR in VAD clinic today alone. Denies any issues with VAD or equipment.   Pt has completed courses of Doxy and Cipro . Per ID pt will need to restart Cipro  if the drainage returns. Pt remains on Keflex  500mg  TID.  Exit Site Care: Existing VAD dressing removed and site care performed using sterile technique. Drive line exit site cleaned with Chlora prep applicators x 2, allowed to dry, and Sorbaview dressing with Silverlon patch applied. Exit site healed and incorporated, the velour is fully implanted at exit site. Site does not tunnel and is incorporated. No redness, tenderness, drainage, foul odor or rash noted. Drive line anchor re-applied x 2. 3 large tegaderms applied. Pt given pack of tegaderms for home use. Pt instructed that it is ok to shower but he needs to clean his showerhead with bleach.   Patient Instructions:  Return to clinic for dressing change/INR next Tuesday  Laurice Pope RN,BSN VAD Coordinator  Office: [phone]  24/7 Pager: [phone]

## 2023-07-04 ENCOUNTER — Ambulatory Visit (HOSPITAL_COMMUNITY)
Admission: RE | Admit: 2023-07-04 | Discharge: 2023-07-04 | Disposition: A | Discharge status: Home / Self Care | Payer: 59 | Source: Ambulatory Visit | Attending: Cardiology | Admitting: Cardiology

## 2023-07-04 DIAGNOSIS — Z95811 Presence of heart assist device: Secondary | ICD-10-CM | POA: Diagnosis not present

## 2023-07-04 DIAGNOSIS — Z4509 Encounter for adjustment and management of other cardiac device: Secondary | ICD-10-CM | POA: Diagnosis present

## 2023-07-04 NOTE — Progress Notes (Signed)
Pt presents for dressing change in VAD clinic today alone. Denies any issues with VAD or equipment.   Pt has completed courses of Doxy and Cipro. Per ID pt will need to restart Cipro if the drainage returns. Pt remains on Keflex 500mg  TID.  Exit Site Care: Existing VAD dressing removed and site care performed using sterile technique. Drive line exit site cleaned with Chlora prep applicators x 2, allowed to dry, and Sorbaview dressing with Silverlon patch applied. Exit site healed and incorporated, the velour is fully implanted at exit site. Site does not tunnel and is incorporated. No redness, tenderness, drainage, foul odor or rash noted. Drive line anchor re-applied x 2. 3 large tegaderms applied. Pt given pack of tegaderms for home use. Pt instructed that it is ok to shower but he needs to clean his showerhead with bleach.   Patient Instructions:  Return to clinic for dressing change/INR next Tuesday  [name] RN,BSN VAD Coordinator  Office: [phone]  24/7 Pager: [phone]

## 2023-07-05 ENCOUNTER — Encounter (HOSPITAL_COMMUNITY): Payer: 59

## 2023-07-07 ENCOUNTER — Other Ambulatory Visit (HOSPITAL_COMMUNITY): Payer: Self-pay | Admitting: *Deleted

## 2023-07-07 DIAGNOSIS — Z95811 Presence of heart assist device: Secondary | ICD-10-CM

## 2023-07-07 DIAGNOSIS — Z7901 Long term (current) use of anticoagulants: Secondary | ICD-10-CM

## 2023-07-14 ENCOUNTER — Ambulatory Visit (HOSPITAL_COMMUNITY)
Admission: RE | Admit: 2023-07-14 | Discharge: 2023-07-14 | Disposition: A | Discharge status: Home / Self Care | Payer: 59 | Source: Ambulatory Visit | Attending: Cardiology | Admitting: Cardiology

## 2023-07-14 ENCOUNTER — Ambulatory Visit (HOSPITAL_COMMUNITY): Payer: Self-pay | Admitting: Pharmacist

## 2023-07-14 DIAGNOSIS — Z95811 Presence of heart assist device: Secondary | ICD-10-CM | POA: Diagnosis not present

## 2023-07-14 DIAGNOSIS — Z7901 Long term (current) use of anticoagulants: Secondary | ICD-10-CM | POA: Diagnosis not present

## 2023-07-14 DIAGNOSIS — Z4801 Encounter for change or removal of surgical wound dressing: Secondary | ICD-10-CM | POA: Insufficient documentation

## 2023-07-14 LAB — PROTIME-INR
INR: 2 — ABNORMAL HIGH (ref 0.8–1.2)
Prothrombin Time: 22.9 s — ABNORMAL HIGH (ref 11.4–15.2)

## 2023-07-14 NOTE — Progress Notes (Signed)
 Pt presents for dressing change in VAD clinic today alone. Denies any issues with VAD or equipment.   Pt has completed courses of Doxy and Cipro. Per ID pt will need to restart Cipro if the drainage returns. Pt remains on Keflex 500mg  TID.  Exit Site Care: Existing VAD dressing removed and site care performed using sterile technique. Drive line exit site cleaned with Chlora prep applicators x 2, allowed to dry, and Sorbaview dressing with Silverlon patch applied. Exit site healed and incorporated, the velour is fully implanted at exit site. Site does not tunnel and is incorporated. No redness, tenderness, drainage, foul odor or rash noted. Drive line anchor re-applied x 2. 2 large tegaderms applied. Pt given pack of tegaderms for home use. Pt instructed that it is ok to shower but he needs to clean his showerhead with bleach.   Patient Instructions:  Return to clinic for full visit next week  Carlton Adam RN,BSN VAD Coordinator  Office: [phone]  24/7 Pager: [phone]

## 2023-07-19 ENCOUNTER — Other Ambulatory Visit (HOSPITAL_COMMUNITY): Payer: Self-pay | Admitting: Unknown Physician Specialty

## 2023-07-19 ENCOUNTER — Other Ambulatory Visit (HOSPITAL_COMMUNITY): Payer: 59

## 2023-07-19 DIAGNOSIS — Z7901 Long term (current) use of anticoagulants: Secondary | ICD-10-CM

## 2023-07-19 DIAGNOSIS — Z95811 Presence of heart assist device: Secondary | ICD-10-CM

## 2023-07-21 ENCOUNTER — Encounter (HOSPITAL_COMMUNITY): Payer: Self-pay | Admitting: Internal Medicine

## 2023-07-21 ENCOUNTER — Ambulatory Visit (HOSPITAL_COMMUNITY)
Admission: RE | Admit: 2023-07-21 | Discharge: 2023-07-21 | Disposition: A | Discharge status: Home / Self Care | Payer: 59 | Source: Ambulatory Visit | Attending: Internal Medicine | Admitting: Internal Medicine

## 2023-07-21 ENCOUNTER — Ambulatory Visit (HOSPITAL_COMMUNITY): Payer: Self-pay | Admitting: Pharmacist

## 2023-07-21 VITALS — BP 112/94 | Ht 74.0 in | Wt 227.6 lb

## 2023-07-21 DIAGNOSIS — Z720 Tobacco use: Secondary | ICD-10-CM

## 2023-07-21 DIAGNOSIS — I5022 Chronic systolic (congestive) heart failure: Secondary | ICD-10-CM | POA: Insufficient documentation

## 2023-07-21 DIAGNOSIS — I110 Hypertensive heart disease with heart failure: Secondary | ICD-10-CM | POA: Insufficient documentation

## 2023-07-21 DIAGNOSIS — Z7901 Long term (current) use of anticoagulants: Secondary | ICD-10-CM | POA: Diagnosis not present

## 2023-07-21 DIAGNOSIS — K279 Peptic ulcer, site unspecified, unspecified as acute or chronic, without hemorrhage or perforation: Secondary | ICD-10-CM | POA: Insufficient documentation

## 2023-07-21 DIAGNOSIS — Z79899 Other long term (current) drug therapy: Secondary | ICD-10-CM | POA: Insufficient documentation

## 2023-07-21 DIAGNOSIS — I428 Other cardiomyopathies: Secondary | ICD-10-CM | POA: Insufficient documentation

## 2023-07-21 DIAGNOSIS — Z87891 Personal history of nicotine dependence: Secondary | ICD-10-CM | POA: Insufficient documentation

## 2023-07-21 DIAGNOSIS — T827XXA Infection and inflammatory reaction due to other cardiac and vascular devices, implants and grafts, initial encounter: Secondary | ICD-10-CM

## 2023-07-21 DIAGNOSIS — Z4502 Encounter for adjustment and management of automatic implantable cardiac defibrillator: Secondary | ICD-10-CM | POA: Diagnosis present

## 2023-07-21 DIAGNOSIS — Z95811 Presence of heart assist device: Secondary | ICD-10-CM

## 2023-07-21 LAB — BASIC METABOLIC PANEL
Anion gap: 7 (ref 5–15)
BUN: 10 mg/dL (ref 6–20)
CO2: 22 mmol/L (ref 22–32)
Calcium: 8.5 mg/dL — ABNORMAL LOW (ref 8.9–10.3)
Chloride: 110 mmol/L (ref 98–111)
Creatinine, Ser: 1.02 mg/dL (ref 0.61–1.24)
GFR, Estimated: >60 mL/min (ref >60)
Glucose, Bld: 105 mg/dL — ABNORMAL HIGH (ref 70–99)
Potassium: 4.1 mmol/L (ref 3.5–5.1)
Sodium: 139 mmol/L (ref 135–145)

## 2023-07-21 LAB — CBC
HCT: 33.6 % — ABNORMAL LOW (ref 39.0–52.0)
Hemoglobin: 10.5 g/dL — ABNORMAL LOW (ref 13.0–17.0)
MCH: 27 pg (ref 26.0–34.0)
MCHC: 31.3 g/dL (ref 30.0–36.0)
MCV: 86.4 fL (ref 80.0–100.0)
Platelets: 215 10*3/uL (ref 150–400)
RBC: 3.89 MIL/uL — ABNORMAL LOW (ref 4.22–5.81)
RDW: 13.5 % (ref 11.5–15.5)
WBC: 4.7 10*3/uL (ref 4.0–10.5)
nRBC: 0 % (ref 0.0–0.2)

## 2023-07-21 LAB — PROTIME-INR
INR: 2.5 — ABNORMAL HIGH (ref 0.8–1.2)
Prothrombin Time: 26.8 s — ABNORMAL HIGH (ref 11.4–15.2)

## 2023-07-21 LAB — LACTATE DEHYDROGENASE: LDH: 207 U/L — ABNORMAL HIGH (ref 98–192)

## 2023-07-21 NOTE — Progress Notes (Addendum)
 Patient presents for 2 month f/u  in VAD clinic today alone. Denies any issues with VAD or equipment.   Pt states he is feeling great. Denies lightheadedness, dizziness, falls, heart failure symptoms, and signs of bleeding. 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  He tells me that by the end of the month he will be ready for [place] to start doing monthly nicotine checks. Discussed today with Dr.Bensimhon.  Vital Signs: Doppler: 120 Automatic BP: 112/94 (100) HR: 89 SPO2: 99 % RA   Weight: 227.6 lbs w/ eqt Last wt: 223.3 lbs w/ equip   VAD Indication: Destination Therapy    VAD interrogation & Equipment Management: Speed: 6500 Flow: 5.5 Power: 5.6w    PI: 3.0 Hct: 35  Alarms: none Events: >50 daily  Primary Controller:  Replace back up battery in 18 months Back up controller: Replace back up battery in 8 months-did not bring  Annual Equipment Maintenance on UBC/PM was performed 10/25/22   I reviewed the LVAD parameters from today and compared the results to the patient's prior recorded data.  LVAD interrogation was NEGATIVE for significant power changes, NEGATIVE for clinical alarms and STABLE for PI events/speed drops. No programming changes were made and pump is functioning within specified parameters.   Exit Site Care: Existing VAD dressing removed and site care performed using sterile technique. Drive line exit site cleaned with Chlora prep applicators x 2, allowed to dry, and Sorbaview dressing with Silverlon patch applied. Exit site healed and incorporated, the velour is fully implanted at exit site. Site does not tunnel and is incorporated. No redness, tenderness, drainage, foul odor or rash noted. Drive line anchor re-applied x 2. 2 large tegaderms applied. Pt given pack of tegaderms for home use. Pt instructed that it is ok to shower but he needs to clean his showerhead with bleach. Pt given 4 weekly  kits and 4 anchors and asked to bring one to each appointment.  Device: none  BP & Labs:  Doppler 120 - reflecting modified systolic   Hgb 10.5 - No S/S of bleeding. Specifically denies melena/BRBPR or nosebleeds.    LDH 207 - established baseline of 200-300. Denies tea-colored urine. No power elevations noted on interrogation.   Patient Instructions:  No change in medications Return to clinic for dressing change next Thursday Return to clinic in 2 mo to see Dr Jonathan Wood  Jonathan Davies RN,BSN VAD Coordinator  Office: [phone]  24/7 Pager: [phone]

## 2023-07-21 NOTE — Patient Instructions (Signed)
 No medication changes Follow up in VAD Clinic for dressing change in 1 week Follow up in 2 months to see Dr.Bensimhon

## 2023-07-21 NOTE — Progress Notes (Signed)
 VAD Clinic Note    ID:  Jonathan, Wood Apr 04, 1971, MRN [id number]  Location: Home  Provider location: [place] [facility] Type of Visit: Established patient  PCP: [name]  Cardiologist:  Arvilla Meres, MD Primary HF: Dr [name]   Chief Complaint: Heart Failure/LVAD  History of Present Illness:  Jonathan Wood is a 53 y.o. 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   GI issues: - 4/20 EGD with gastritis and nonbleeding duodenal ulcers, colonoscopy with 5 polyps removed. - 10/21 ETOH pancreatitis. Gallbladder ok. Resolved with medical management   Infection issues: - Admitted 12/20 for DL infection with overlying cellulitis and volume overload.  - Admitted 4/21 for DL infection with large subxiphoid abscess. Underwent several debridements and eventually a pec muscle flap. Wound cx + MSSA. Initially on ancef and rifabutin but expanded to cefipime then changed to IV ancef and po rifabutin.  - Readmitted 6/21 with superficial abscess and tachypnea. Superficial abscess I&D'd and packed. CT scan done and showed possible deeper fluid collection. Reviewed with TCTS who felt it was muscle flap. Sent to IR for possible drainage but u/s confirmed it was the muscle flap and no drainable fluid collection. Wound cx negative. ID saw and recommended continuing Keflex.  - Admitted 8/24 for DL infection He was admitted  for debridement and observation after. Wound packed with VASHE wet/dry dressing. Completed 3 days of IV cefepime while admitted. Discharged on po cipro 750 bid = conbtinue Keflex for previous MSSA  RV failure: - Admitted 8/21 with lactic acidosis due to cardiogenic shock. Started on 0.25 of milrinone and NE. Ramp echo on 8/13 with severe RV dysfunction. Speed increased 5600-> 6300. Tolerated ok. Repeat ramp echo on 8/20 speed increased to 6500.  Diuresed over 40 pounds.   Here for regular f/u. Doing well. Has not smoked for 9 months but vaping occasionally. Remains active. No CP or SOB. Denies orthopnea or PND. No fevers, chills or problems with driveline. No bleeding, melena or neuro symptoms. No VAD alarms. Taking all meds as prescribed.       VAD Indication: Destination Therapy    VAD interrogation & Equipment Management: Speed: 6500 Flow: 5.6 Power: 5.6w    PI: 3.0 Hct: 35   Alarms: none Events: >50 daily   Primary Controller:  Replace back up battery in 18 months Back up controller: Replace back up battery in 8 months-did not bring   Annual Equipment Maintenance on UBC/PM was performed 10/25/22    I reviewed the LVAD parameters from today and compared the results to the patient's prior recorded data.  LVAD interrogation was NEGATIVE for significant power changes, NEGATIVE for clinical alarms and STABLE for PI events/speed drops. No programming changes were made and pump is functioning within specified parameters.     Past Medical History:  Diagnosis Date   Asthma    CHF (congestive heart failure) (HCC)    a. 09/2016: EF 20-25% with cath showing normal cors   GERD (gastroesophageal reflux disease)    History of hiatal hernia    LVAD (left ventricular assist device) present (HCC)    OSA on CPAP 09/06/2018   Severe OSA with AHI 68/hr on CPAP at 12cm H2O   Past Surgical History:  Procedure Laterality Date   APPLICATION OF A-CELL OF CHEST/ABDOMEN N/A 08/24/2019   Procedure: Application Of A-Cell Of Chest/Abdomen;  Surgeon:  Jonathan Perna, MD;  Location: [facility] OR;  Service: Thoracic;  Laterality: N/A;   APPLICATION OF A-CELL OF CHEST/ABDOMEN N/A 09/05/2019   Procedure: Application Of A-Cell Of Chest/Abdomen;  Surgeon: Jonathan Form, DO;  Location: [facility] OR;  Service: Plastics;  Laterality: N/A;   APPLICATION OF A-CELL OF CHEST/ABDOMEN  09/03/2019   Procedure: Application Of A-Cell Of Chest/Abdomen;  Surgeon: Jonathan Perna, MD;  Location: [facility] OR;  Service: Thoracic;;   APPLICATION OF WOUND VAC N/A 08/22/2019   Procedure: Debridement, Irrigation and Packing of Abdominal Incision.;  Surgeon: Jonathan Perna, MD;  Location: [facility] OR;  Service: Thoracic;  Laterality: N/A;   APPLICATION OF WOUND VAC N/A 08/24/2019   Procedure: Irrigation and Debridement with WOUND VAC APPLICATION;  Surgeon: Jonathan Perna, MD;  Location: [facility] OR;  Service: Thoracic;  Laterality: N/A;   APPLICATION OF WOUND VAC N/A 08/30/2019   Procedure: APPLICATION OF WOUND VAC;  Surgeon: Jonathan Perna, MD;  Location: [facility] OR;  Service: Thoracic;  Laterality: N/A;   APPLICATION OF WOUND VAC N/A 09/03/2019   Procedure: WOUND VAC CHANGE;  Surgeon: Jonathan Perna, MD;  Location: [facility] OR;  Service: Thoracic;  Laterality: N/A;   BIOPSY  08/23/2018   Procedure: BIOPSY;  Surgeon: Jonathan Wood., MD;  Location: [facility] ENDOSCOPY;  Service: Gastroenterology;;   COLONOSCOPY N/A 08/23/2018   Procedure: COLONOSCOPY;  Surgeon: Jonathan Wood., MD;  Location: [facility] ENDOSCOPY;  Service: Gastroenterology;  Laterality: N/A;   ENTEROSCOPY N/A 08/23/2018   Procedure: ENTEROSCOPY;  Surgeon: Meridee Score Netty Starring., MD;  Location: [facility] ENDOSCOPY;  Service: Gastroenterology;  Laterality: N/A;   HEMOSTASIS CLIP PLACEMENT  08/23/2018   Procedure: HEMOSTASIS CLIP PLACEMENT;  Surgeon: Jonathan Wood., MD;  Location: [facility] ENDOSCOPY;  Service: Gastroenterology;;   IABP INSERTION N/A 09/05/2017   Procedure: IABP INSERTION;  Surgeon: Jonathan Patty, MD;  Location: [facility] INVASIVE CV LAB;  Service: Cardiovascular;  Laterality: N/A;   INSERTION OF IMPLANTABLE LEFT VENTRICULAR ASSIST DEVICE N/A 09/06/2017   Procedure: INSERTION OF IMPLANTABLE LEFT VENTRICULAR ASSIST DEVICE - HM3;  Surgeon: Jonathan Perna, MD;  Location: [facility] OR;  Service: Open Heart Surgery;  Laterality: N/A;  HM3   MUSCLE FLAP CLOSURE N/A 09/05/2019   Procedure: MUSCLE FLAP CLOSURE;  Surgeon: Jonathan Form, DO;  Location: [facility] OR;  Service: Plastics;  Laterality: N/A;   NASAL FRACTURE SURGERY  1987   POLYPECTOMY  08/23/2018   Procedure: POLYPECTOMY;  Surgeon: Mansouraty, Netty Starring., MD;  Location: [facility] ENDOSCOPY;  Service: Gastroenterology;;   RIGHT HEART CATH N/A 08/30/2017   Procedure: RIGHT HEART CATH;  Surgeon: Jonathan Patty, MD;  Location: [facility] INVASIVE CV LAB;  Service: Cardiovascular;  Laterality: N/A;   RIGHT/LEFT HEART CATH AND CORONARY ANGIOGRAPHY N/A 10/08/2016   Procedure: Right/Left Heart Cath and Coronary Angiography;  Surgeon: Orpah Cobb, MD;  Location: [facility] INVASIVE CV LAB;  Service: Cardiovascular;  Laterality: N/A;   STERNAL WOUND DEBRIDEMENT N/A 08/20/2019   Procedure: DEBRIDEMENT OF LVAD DRIVELINE TUNNEL;  Surgeon: Jonathan Perna, MD;  Location: [facility] OR;  Service: Thoracic;  Laterality: N/A;   STERNAL WOUND DEBRIDEMENT N/A 09/05/2019   Procedure: DEBRIDEMENT AND CLOSURE OF ABDOMINAL WOUND;  Surgeon: Jonathan Form, DO;  Location: [facility] OR;  Service: Plastics;  Laterality: N/A;   STERNAL WOUND DEBRIDEMENT N/A 01/13/2023   Procedure: VAD TUNNEL WOUND DEBRIDEMENT;  Surgeon: Lovett Sox, MD;  Location: [facility] OR;  Service: Thoracic;  Laterality: N/A;   SUBMUCOSAL TATTOO INJECTION  08/23/2018   Procedure: SUBMUCOSAL TATTOO INJECTION;  Surgeon: Jonathan Wood., MD;  Location: [facility] ENDOSCOPY;  Service: Gastroenterology;;   TEE WITHOUT CARDIOVERSION N/A 09/06/2017   Procedure: TRANSESOPHAGEAL ECHOCARDIOGRAM (TEE);  Surgeon: Donata Clay, Theron Arista, MD;  Location: [facility] OR;  Service: Open Heart Surgery;  Laterality: N/A;   WOUND DEBRIDEMENT N/A 08/30/2019   Procedure: Debridement Abdominal Wound;  Surgeon: Jonathan Perna, MD;  Location: [facility] OR;  Service: Thoracic;  Laterality: N/A;     Current Outpatient Medications  Medication Sig Dispense Refill   cephALEXin (KEFLEX) 500 MG capsule Take 1 capsule (500 mg total) by mouth 3 (three) times daily. 90 capsule 11   doxazosin (CARDURA) 2  MG tablet Take 0.5 tablets (1 mg total) by mouth at bedtime. 45 tablet 3   gabapentin (NEURONTIN) 300 MG capsule TAKE ONE CAPSULE BY MOUTH THREE TIMES DAILY AT 9am, 3pm, AND 9pm 90 capsule 5   pantoprazole (PROTONIX) 40 MG tablet Take 1 tablet (40 mg total) by mouth in the morning, at noon, and at bedtime. 180 tablet 3   sacubitril-valsartan (ENTRESTO) 97-103 MG Take 1 tablet by mouth 2 (two) times daily. 60 tablet 6   sildenafil (REVATIO) 20 MG tablet Take 1 tablet (20 mg total) by mouth 3 (three) times daily. 90 tablet 11   spironolactone (ALDACTONE) 25 MG tablet Take 0.5 tablets (12.5 mg total) by mouth daily. 45 tablet 6   warfarin (COUMADIN) 5 MG tablet Take 1.5 tablets (7.5 mg total) by mouth daily at 4 PM. 7.5 mg (1.5 tab) Monday and Friday, 5 mg (1 tablet) all other days OR as directed by HF clinic 90 tablet 5   No current facility-administered medications for this encounter.    Allergies:   Patient has no known allergies.   Social History:  The patient  reports that he has quit smoking. His smoking use included cigarettes. He has a 12.5 pack-year smoking history. He has never used smokeless tobacco. He reports that he does not currently use alcohol. He reports that he does not currently use drugs after having used the following drugs: Marijuana.   Family History:  The patient's family history includes Alcohol abuse in his cousin; Hypertension in his father.   ROS:  Please see the history of present illness.   All other systems are personally reviewed and negative.    Vital Signs: Doppler: 120 Automatic BP: 112/94 (100) HR: 87 SPO2: 99 % RA   Weight: 227.6 lbs w/ eqt Last wt: 223.3 lbs w/ equip   Exam: General:  NAD.  HEENT: normal  Neck: supple. JVP not elevated.  Carotids 2+ bilat; no bruits. No lymphadenopathy or thryomegaly appreciated. Cor: LVAD hum.  Lungs: Clear. Abdomen: soft, nontender, non-distended. No hepatosplenomegaly. No bruits or masses. Good bowel sounds.  Driveline site clean. Anchor in place.  Extremities: no cyanosis, clubbing, rash. Warm no edema  Neuro: alert & oriented x 3. No focal deficits. Moves all 4 without problem     Recent Labs: 05/26/2023: ALT 13 07/21/2023: BUN 10; Creatinine, Ser 1.02; Hemoglobin 10.5; Platelets 215; Potassium 4.1; Sodium 139    Wt Readings from Last 3 Encounters:  07/21/23 103.2 kg (227 lb 9.6 oz)  05/26/23 101.3 kg (223 lb 4.8 oz)  03/22/23 98.7 kg (217 lb 9.6 oz)      ASSESSMENT AND PLAN:  1. 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 Treated with  milrinone and then weaned off.  Diuresed 40 pounds. VAD speed increased - Stable NYHA I  - Volume looks good.  - Continue Entresto and spiro.  - He is very motivated to proceed with transplant. Has stopped smoking for 9 months but is still vaping occasionally but is very motivated to stop. Blood Type B+. Will start checking cotinine levels when he stops vaping. We discussed transplant referral process including need to check PRA levels  2. HM3 LVAD 09/06/2017.  - VAD interrogated personally. Parameters stable. Still with frequent PI events - LDH 207 - INR 2.5 Goal 2.0-3.0 Discussed warfarin dosing with PharmD personally. - Off ASA with PUD.  - DL site looks good. Off doxy/cipro. Continue suppressive Keflex. Low threshold to add back cipro if develops any drainage  3.  Essential HTN - MAPs slightly elevated.  - Can increase doxazosin as needed  4. Recurrent DL infection with subxiphoid abscess - s/p debridement and pec flap in 4/21 - resolved. Completed doxy/cipro - Continue Keflex as above  5.  H/o GI bleed  - 08/2018 EGD showed gastritis and nonbleeding duodenal ulcers, colonoscopy with 5 polyps removed.  - Remains on PPI.No recent GI bleeding - hgb stable at 11.5 -> 10.4 -> 11.3 -> 10.5  6. Tobacco Abuse  - has now quit for 9 months.  - will  start checking monthly cotinine levels once he quits vaping  I spent a total of 45 minutes today: 1) reviewing the patient's medical records including previous charts, labs and recent notes from other providers; 2) examining the patient and counseling them on their medical issues/explaining the plan of care; 3) adjusting meds as needed and 4) ordering lab work or other needed tests.    Signed, Arvilla Meres, MD  07/21/2023 6:49 PM  [facility] [address] [facility] [place] [address] [phone] (office) [phone] (fax)

## 2023-07-23 ENCOUNTER — Other Ambulatory Visit (HOSPITAL_COMMUNITY): Payer: Self-pay | Admitting: Internal Medicine

## 2023-07-23 DIAGNOSIS — Z95811 Presence of heart assist device: Secondary | ICD-10-CM

## 2023-07-28 ENCOUNTER — Ambulatory Visit (HOSPITAL_COMMUNITY)
Admission: RE | Admit: 2023-07-28 | Discharge: 2023-07-28 | Disposition: A | Discharge status: Home / Self Care | Source: Ambulatory Visit | Attending: Cardiology | Admitting: Cardiology

## 2023-07-28 DIAGNOSIS — Z95811 Presence of heart assist device: Secondary | ICD-10-CM | POA: Diagnosis not present

## 2023-07-28 DIAGNOSIS — Z4801 Encounter for change or removal of surgical wound dressing: Secondary | ICD-10-CM | POA: Insufficient documentation

## 2023-07-28 NOTE — Progress Notes (Signed)
 Pt presents for dressing change and INR in VAD clinic today alone. Denies any issues with VAD or equipment.   Pt has completed courses of Doxy and Cipro. Per ID pt will need to restart Cipro if the drainage returns. Pt remains on Keflex 500mg  TID.  Exit Site Care: Existing VAD dressing removed and site care performed using sterile technique. Drive line exit site cleaned with Chlora prep applicators x 2, allowed to dry, and Sorbaview dressing with Silverlon patch applied. Exit site healed and incorporated, the velour is fully implanted at exit site. Site does not tunnel and is incorporated. No redness, tenderness, drainage, foul odor or rash noted. Drive line anchor re-applied x 2. 2 large tegaderms applied. Pt has sufficient kits at home. Pt instructed that it is ok to shower but he needs to clean his showerhead with bleach.   Patient Instructions:  Return to clinic next week for dressing change/INR  Carlton Adam RN,BSN VAD Coordinator  Office: [phone]  24/7 Pager: [phone]

## 2023-07-29 ENCOUNTER — Other Ambulatory Visit (HOSPITAL_COMMUNITY): Payer: Self-pay | Admitting: Unknown Physician Specialty

## 2023-07-29 DIAGNOSIS — Z95811 Presence of heart assist device: Secondary | ICD-10-CM

## 2023-07-29 DIAGNOSIS — Z7901 Long term (current) use of anticoagulants: Secondary | ICD-10-CM

## 2023-08-04 ENCOUNTER — Other Ambulatory Visit (HOSPITAL_COMMUNITY)

## 2023-08-08 ENCOUNTER — Ambulatory Visit (HOSPITAL_COMMUNITY): Payer: Self-pay | Admitting: Pharmacist

## 2023-08-08 ENCOUNTER — Ambulatory Visit (HOSPITAL_COMMUNITY)
Admission: RE | Admit: 2023-08-08 | Discharge: 2023-08-08 | Disposition: A | Discharge status: Home / Self Care | Source: Ambulatory Visit | Attending: Cardiology | Admitting: Cardiology

## 2023-08-08 DIAGNOSIS — Z7901 Long term (current) use of anticoagulants: Secondary | ICD-10-CM | POA: Diagnosis not present

## 2023-08-08 DIAGNOSIS — Z4801 Encounter for change or removal of surgical wound dressing: Secondary | ICD-10-CM | POA: Diagnosis present

## 2023-08-08 DIAGNOSIS — Z95811 Presence of heart assist device: Secondary | ICD-10-CM | POA: Insufficient documentation

## 2023-08-08 LAB — PROTIME-INR
INR: 2 — ABNORMAL HIGH (ref 0.8–1.2)
Prothrombin Time: 23.1 s — ABNORMAL HIGH (ref 11.4–15.2)

## 2023-08-08 NOTE — Progress Notes (Signed)
 Pt presents for dressing change and INR in VAD clinic today alone. Denies any issues with VAD or equipment.   Pt has completed courses of Doxy and Cipro. Per ID pt will need to restart Cipro if the drainage returns. Pt remains on Keflex 500mg  TID.  Exit Site Care: Existing VAD dressing removed and site care performed using sterile technique. Drive line exit site cleaned with Chlora prep applicators x 2, allowed to dry, and Sorbaview dressing with Silverlon patch applied. Exit site healed and incorporated, the velour is fully implanted at exit site. Site does not tunnel and is incorporated. No redness, tenderness, drainage, foul odor or rash noted. Drive line anchor re-applied x 2. 2 large tegaderms applied. Pt has sufficient kits at home. Pt instructed that it is ok to shower but he needs to clean his showerhead with bleach.   Patient Instructions:  Return to clinic next week for dressing change/INR  [name] RN,BSN VAD Coordinator  Office: [phone]  24/7 Pager: [phone]

## 2023-08-08 NOTE — Addendum Note (Signed)
 Encounter addended by: Flora Lipps, RN on: 08/08/2023 9:47 AM  Actions taken: Clinical Note Signed, Charge Capture section accepted

## 2023-08-10 ENCOUNTER — Other Ambulatory Visit (HOSPITAL_COMMUNITY): Payer: Self-pay

## 2023-08-10 DIAGNOSIS — Z7901 Long term (current) use of anticoagulants: Secondary | ICD-10-CM

## 2023-08-10 DIAGNOSIS — Z95811 Presence of heart assist device: Secondary | ICD-10-CM

## 2023-08-19 ENCOUNTER — Ambulatory Visit (HOSPITAL_COMMUNITY): Payer: Self-pay | Admitting: Pharmacist

## 2023-08-19 ENCOUNTER — Other Ambulatory Visit (HOSPITAL_COMMUNITY): Payer: Self-pay

## 2023-08-19 ENCOUNTER — Ambulatory Visit (HOSPITAL_COMMUNITY)
Admission: RE | Admit: 2023-08-19 | Discharge: 2023-08-19 | Disposition: A | Discharge status: Home / Self Care | Source: Ambulatory Visit | Attending: Internal Medicine | Admitting: Internal Medicine

## 2023-08-19 DIAGNOSIS — Z792 Long term (current) use of antibiotics: Secondary | ICD-10-CM | POA: Diagnosis not present

## 2023-08-19 DIAGNOSIS — Z95811 Presence of heart assist device: Secondary | ICD-10-CM

## 2023-08-19 DIAGNOSIS — Z7901 Long term (current) use of anticoagulants: Secondary | ICD-10-CM | POA: Diagnosis not present

## 2023-08-19 DIAGNOSIS — K029 Dental caries, unspecified: Secondary | ICD-10-CM | POA: Insufficient documentation

## 2023-08-19 DIAGNOSIS — Z4509 Encounter for adjustment and management of other cardiac device: Secondary | ICD-10-CM | POA: Insufficient documentation

## 2023-08-19 DIAGNOSIS — Z79899 Other long term (current) drug therapy: Secondary | ICD-10-CM | POA: Diagnosis present

## 2023-08-19 LAB — PROTIME-INR
INR: 2.1 — ABNORMAL HIGH (ref 0.8–1.2)
Prothrombin Time: 24.1 s — ABNORMAL HIGH (ref 11.4–15.2)

## 2023-08-19 NOTE — Progress Notes (Addendum)
 Pt presents for dressing change, INR and Nicotine screen in VAD clinic today alone. Denies any issues with VAD or equipment.   Pt has completed courses of Doxy and Cipro. Per ID pt will need to restart Cipro if the drainage returns. Pt remains on Keflex 500mg  TID.  Pt complaining of pain in his rear teeth. Panorex ordered today. Discussed with Dr. [name] will refer to dentist for evaluation as soon as possible. Pt reached out to local dentist in [place], [place] and states they did not take his insurance. VAD team send referral to Dr. Barbette Merino.   Exit Site Care: Existing VAD dressing removed and site care performed using sterile technique. Drive line exit site cleaned with Chlora prep applicators x 2, allowed to dry, and Sorbaview dressing with Silverlon patch applied. Exit site healed and incorporated, the velour is fully implanted at exit site. Site does not tunnel and is incorporated. No redness, tenderness, drainage, foul odor or rash noted. Mild raw area noted right under drive line and extra 2x2 placed to help cushion area to prevent further irritation. Drive line anchor re-applied x 2. 2 large tegaderms applied. Pt given 4 weekly kits at today's visit. Pt asked to bring one to each appointment for dressing care.  Pt instructed that it is ok to shower but he needs to clean his showerhead with bleach.   Patient Instructions:  Return to clinic next week for dressing change/INR  [name] RN,BSN VAD Coordinator  Office: [phone]  24/7 Pager: [phone]

## 2023-08-19 NOTE — Addendum Note (Signed)
 Encounter addended by: Flora Lipps, RN on: 08/19/2023 1:12 PM  Actions taken: Clinical Note Signed

## 2023-08-20 ENCOUNTER — Encounter: Payer: Self-pay | Admitting: *Deleted

## 2023-08-20 NOTE — Progress Notes (Signed)
 Pt paged reporting minor nosebleed. States blood "dripping out" of nose for last hour. Instructed to hold firm pressure on bridge of nose with ice pack in place for 30-45 minutes. Advised to not pack tissue into nostrils while holding pressure as to not disrupt clot when he has finished holding pressure.  Instructed to page VAD coordinator if bleeding does not resolve. He verbalized understanding.   Alyce Pagan RN VAD Coordinator  Office: [phone]  Pager: [phone]

## 2023-08-22 ENCOUNTER — Other Ambulatory Visit (HOSPITAL_COMMUNITY): Payer: Self-pay | Admitting: *Deleted

## 2023-08-22 ENCOUNTER — Telehealth (HOSPITAL_COMMUNITY): Payer: Self-pay | Admitting: *Deleted

## 2023-08-22 NOTE — Telephone Encounter (Signed)
 Spoke with Dr Randa Evens office regarding need for consultation. Panorex completed on 08/19/23, but has not been read yet. Appt scheduled Wednesday 08/24/23 at 1 PM. In-basket message regarding patient sent to Dr Barbette Merino per dental office request.   Spoke with pt regarding the above appt information. Provided office address and phone number: Address: [address], [place], [place] [address]. Phone: [phone]. He verbalized understanding.   Alyce Pagan RN VAD Coordinator  Office: [phone]  24/7 Pager: [phone]

## 2023-08-24 ENCOUNTER — Ambulatory Visit (HOSPITAL_COMMUNITY)
Admission: RE | Admit: 2023-08-24 | Discharge: 2023-08-24 | Disposition: A | Discharge status: Home / Self Care | Source: Ambulatory Visit | Attending: Cardiology | Admitting: Cardiology

## 2023-08-24 DIAGNOSIS — Z4509 Encounter for adjustment and management of other cardiac device: Secondary | ICD-10-CM | POA: Diagnosis not present

## 2023-08-24 DIAGNOSIS — Z95811 Presence of heart assist device: Secondary | ICD-10-CM

## 2023-08-24 LAB — NICOTINE/COTININE METABOLITES
Cotinine: 11.4 ng/mL
Nicotine: <1 ng/mL

## 2023-08-24 NOTE — Progress Notes (Signed)
 Pt presents for dressing change and INR in VAD clinic today alone. Denies any issues with VAD or equipment.   Pt has completed courses of Doxy and Cipro. Per ID pt will need to restart Cipro if the drainage returns. Pt remains on Keflex 500mg  TID.  Exit Site Care: Existing VAD dressing removed and site care performed using sterile technique. Drive line exit site cleaned with Chlora prep applicators x 2, allowed to dry, and Sorbaview dressing with Silverlon patch applied. Exit site healed and incorporated, the velour is fully implanted at exit site. Site does not tunnel and is incorporated. No redness, tenderness, drainage, foul odor or rash noted. Drive line anchor re-applied x 2. 2 large tegaderms applied. Pt has sufficient kits at home. Pt instructed that it is ok to shower but he needs to clean his showerhead with bleach.   Patient Instructions:  Return to clinic next week for dressing change/INR  Alyce Pagan RN VAD Coordinator  Office: [phone]  24/7 Pager: [phone]

## 2023-08-26 ENCOUNTER — Encounter (HOSPITAL_COMMUNITY)

## 2023-08-26 ENCOUNTER — Other Ambulatory Visit (HOSPITAL_COMMUNITY): Payer: Self-pay | Admitting: Unknown Physician Specialty

## 2023-08-26 ENCOUNTER — Other Ambulatory Visit (HOSPITAL_COMMUNITY): Payer: Self-pay | Admitting: *Deleted

## 2023-08-26 DIAGNOSIS — Z95811 Presence of heart assist device: Secondary | ICD-10-CM

## 2023-08-26 DIAGNOSIS — Z7901 Long term (current) use of anticoagulants: Secondary | ICD-10-CM

## 2023-08-29 ENCOUNTER — Other Ambulatory Visit (HOSPITAL_COMMUNITY): Payer: Self-pay

## 2023-08-29 ENCOUNTER — Other Ambulatory Visit (HOSPITAL_COMMUNITY): Payer: Self-pay | Admitting: *Deleted

## 2023-08-29 DIAGNOSIS — I5022 Chronic systolic (congestive) heart failure: Secondary | ICD-10-CM

## 2023-08-29 DIAGNOSIS — Z95811 Presence of heart assist device: Secondary | ICD-10-CM

## 2023-08-29 DIAGNOSIS — I420 Dilated cardiomyopathy: Secondary | ICD-10-CM

## 2023-08-29 MED ORDER — PANTOPRAZOLE SODIUM 40 MG PO TBEC: 40.0000 mg | DELAYED_RELEASE_TABLET | Freq: Two times a day (BID) | ORAL | 3 refills | Status: AC

## 2023-08-29 NOTE — Telephone Encounter (Signed)
 Received call from pt stating Protonix TID will cost him ~ $18 out of pocket. He would prefer not to pay this out of pocket and requests Protonix BID prescription be sent to his pharmacy. Updated prescription sent to pharmacy as requested.   Paulo Bosworth RN VAD Coordinator  Office: [phone]  24/7 Pager: [phone]

## 2023-09-01 ENCOUNTER — Ambulatory Visit (HOSPITAL_COMMUNITY)
Admission: RE | Admit: 2023-09-01 | Discharge: 2023-09-01 | Disposition: A | Discharge status: Home / Self Care | Source: Ambulatory Visit | Attending: Cardiology | Admitting: Cardiology

## 2023-09-01 ENCOUNTER — Other Ambulatory Visit (HOSPITAL_COMMUNITY): Payer: Self-pay

## 2023-09-01 ENCOUNTER — Ambulatory Visit (HOSPITAL_COMMUNITY): Payer: Self-pay | Admitting: Pharmacist

## 2023-09-01 DIAGNOSIS — Z95811 Presence of heart assist device: Secondary | ICD-10-CM

## 2023-09-01 DIAGNOSIS — Z4801 Encounter for change or removal of surgical wound dressing: Secondary | ICD-10-CM | POA: Diagnosis present

## 2023-09-01 DIAGNOSIS — I5022 Chronic systolic (congestive) heart failure: Secondary | ICD-10-CM

## 2023-09-01 DIAGNOSIS — Z7901 Long term (current) use of anticoagulants: Secondary | ICD-10-CM | POA: Diagnosis not present

## 2023-09-01 DIAGNOSIS — I10 Essential (primary) hypertension: Secondary | ICD-10-CM

## 2023-09-01 LAB — PROTIME-INR
INR: 2.5 — ABNORMAL HIGH (ref 0.8–1.2)
Prothrombin Time: 27.1 s — ABNORMAL HIGH (ref 11.4–15.2)

## 2023-09-01 MED ORDER — DOXAZOSIN MESYLATE 2 MG PO TABS: 1.0000 mg | ORAL_TABLET | Freq: Every day | ORAL | 3 refills | Status: DC

## 2023-09-01 NOTE — Progress Notes (Signed)
 Pt presents for dressing change and INR in VAD clinic today alone. Denies any issues with VAD or equipment.   Pt has completed courses of Doxy and Cipro. Per ID pt will need to restart Cipro if the drainage returns. Pt remains on Keflex 500mg  TID.  Exit Site Care: Existing VAD dressing removed and site care performed using sterile technique. Drive line exit site cleaned with Chlora prep applicators x 2, allowed to dry, and Sorbaview dressing with Silverlon patch applied. Exit site healed and incorporated, the velour is fully implanted at exit site. Site does not tunnel and is incorporated. No redness, tenderness, drainage, foul odor or rash noted. Drive line anchor re-applied x 2. 2 large tegaderms applied. Pt has sufficient kits at home. Pt instructed that it is ok to shower but he needs to clean his showerhead with bleach.   Patient Instructions:  Return to clinic next week for dressing change/INR  Laurice Pope RN, BSN VAD Coordinator 24/7 Pager [phone]

## 2023-09-07 ENCOUNTER — Ambulatory Visit (HOSPITAL_COMMUNITY)
Admission: RE | Admit: 2023-09-07 | Discharge: 2023-09-07 | Disposition: A | Discharge status: Home / Self Care | Source: Ambulatory Visit | Attending: Cardiology | Admitting: Cardiology

## 2023-09-07 DIAGNOSIS — Z95811 Presence of heart assist device: Secondary | ICD-10-CM | POA: Insufficient documentation

## 2023-09-07 NOTE — Progress Notes (Signed)
 Pt presents for dressing change and INR in VAD clinic today alone. Denies any issues with VAD or equipment.   Pt has completed courses of Doxy and Cipro . Per ID pt will need to restart Cipro  if the drainage returns. Pt remains on Keflex  500mg  TID.  Pt getting two teeth extractions today with Dr. Juleen Oakland. Advised to please notify VAD team if new prescriptions are prescribed.  Exit Site Care: Existing VAD dressing removed and site care performed using sterile technique. Drive line exit site cleaned with Chlora prep applicators x 2, allowed to dry, and Sorbaview dressing with Silverlon patch applied. Exit site healed and incorporated, the velour is fully implanted at exit site. Site does not tunnel and is incorporated. No redness, tenderness, drainage, foul odor or rash noted. Drive line anchor re-applied x 2. 2 large tegaderms applied. Pt has sufficient kits at home. Pt instructed that it is ok to shower but he needs to clean his showerhead with bleach.   Patient Instructions:  Return to clinic next week for dressing change/INR  Laurice Pope RN, BSN VAD Coordinator 24/7 Pager [phone]

## 2023-09-14 ENCOUNTER — Ambulatory Visit (HOSPITAL_COMMUNITY)
Admission: RE | Admit: 2023-09-14 | Discharge: 2023-09-14 | Disposition: A | Discharge status: Home / Self Care | Source: Ambulatory Visit | Attending: Internal Medicine | Admitting: Internal Medicine

## 2023-09-14 DIAGNOSIS — Z4801 Encounter for change or removal of surgical wound dressing: Secondary | ICD-10-CM | POA: Diagnosis present

## 2023-09-14 DIAGNOSIS — Z95811 Presence of heart assist device: Secondary | ICD-10-CM | POA: Diagnosis not present

## 2023-09-14 NOTE — Progress Notes (Signed)
 Pt presents for dressing change and INR in VAD clinic today alone. Denies any issues with VAD or equipment.   Pt has completed courses of Doxy and Cipro . Per ID pt will need to restart Cipro  if the drainage returns. Pt remains on Keflex  500mg  TID.  Pt called the office yesterday with beeping from his batteries even though they were fully charged. Pt was instructed to change his clips. Pt tells me today that he has not had anymore beeping since changing his clips. Pins were examined in both leads of his controller are were functioning in normal limits and nothing broken. We will order the pt a new set of back up clips. Pt was instructed to throw away his old clips that were causing the beeping.  Exit Site Care: Existing VAD dressing removed and site care performed using sterile technique. Drive line exit site cleaned with Chlora prep applicators x 2, allowed to dry, and Sorbaview dressing with Silverlon patch applied. Exit site healed and incorporated, the velour is fully implanted at exit site. Site does not tunnel and is incorporated. No redness, tenderness, drainage, foul odor or rash noted. Drive line anchor re-applied x 2. 2 large tegaderms applied. Pt has sufficient kits at home. Pt instructed that it is ok to shower but he needs to clean his showerhead with bleach.   Patient Instructions:  Return to clinic next week for a full visit with Dr Julane Ny  Adams Adams RN, BSN VAD Coordinator 24/7 Pager [phone]

## 2023-09-21 ENCOUNTER — Other Ambulatory Visit (HOSPITAL_COMMUNITY): Payer: Self-pay

## 2023-09-21 DIAGNOSIS — Z95811 Presence of heart assist device: Secondary | ICD-10-CM

## 2023-09-21 DIAGNOSIS — Z7901 Long term (current) use of anticoagulants: Secondary | ICD-10-CM

## 2023-09-22 ENCOUNTER — Encounter (HOSPITAL_COMMUNITY): Admitting: Internal Medicine

## 2023-09-23 ENCOUNTER — Other Ambulatory Visit (HOSPITAL_COMMUNITY): Payer: Self-pay | Admitting: *Deleted

## 2023-09-23 DIAGNOSIS — Z95811 Presence of heart assist device: Secondary | ICD-10-CM

## 2023-09-23 DIAGNOSIS — Z7901 Long term (current) use of anticoagulants: Secondary | ICD-10-CM

## 2023-09-26 ENCOUNTER — Ambulatory Visit (HOSPITAL_COMMUNITY)
Admission: RE | Admit: 2023-09-26 | Discharge: 2023-09-26 | Disposition: A | Discharge status: Home / Self Care | Source: Ambulatory Visit | Attending: Internal Medicine | Admitting: Internal Medicine

## 2023-09-26 ENCOUNTER — Ambulatory Visit (HOSPITAL_COMMUNITY): Payer: Self-pay | Admitting: Pharmacist

## 2023-09-26 ENCOUNTER — Encounter (HOSPITAL_COMMUNITY): Admitting: Cardiology

## 2023-09-26 DIAGNOSIS — Z4509 Encounter for adjustment and management of other cardiac device: Secondary | ICD-10-CM | POA: Insufficient documentation

## 2023-09-26 DIAGNOSIS — Z95811 Presence of heart assist device: Secondary | ICD-10-CM | POA: Diagnosis present

## 2023-09-26 DIAGNOSIS — Z7901 Long term (current) use of anticoagulants: Secondary | ICD-10-CM | POA: Diagnosis not present

## 2023-09-26 LAB — PROTIME-INR
INR: 2.1 — ABNORMAL HIGH (ref 0.8–1.2)
Prothrombin Time: 23.3 s — ABNORMAL HIGH (ref 11.4–15.2)

## 2023-09-26 NOTE — Addendum Note (Signed)
 Encounter addended by: Guerry Leek, RN on: 09/26/2023 10:54 AM  Actions taken: Clinical Note Signed

## 2023-09-26 NOTE — Progress Notes (Addendum)
 Pt presents for dressing change and INR in VAD clinic today alone.   Pt has completed courses of Doxy and Cipro . Per ID pt will need to restart Cipro  if the drainage returns. Pt remains on Keflex  500mg  TID.  Pt complaining of intermittent LOW VOLTAGE alarms despite having fully charged batteries and changing clips. Pins intact on controller power leads and in clips. This has been a ongoing issue for the last three weeks and has not resolved with multiple attempts at troubleshooting. Decision made to perform elective controller change out in VAD Clinic today. Pt lying down on stretcher and made aware that his pump will briefly stop during controller exchange and he may feel lightheaded or dizzy. Controller exchange with SN: [id number] without complication. Pt advised to call VAD Clinic if he has any recurrent LOW VOLTAGE alarms following controller exchange.  Exit Site Care: Existing VAD dressing removed and site care performed using sterile technique. Drive line exit site cleaned with Chlora prep applicators x 2, allowed to dry, and Sorbaview dressing with Silverlon patch applied. Exit site healed and incorporated, the velour is fully implanted at exit site. Site does not tunnel and is incorporated. No redness, tenderness, drainage, foul odor or rash noted. Drive line anchor re-applied x 2. 2 large tegaderms applied. Pt given 4 weekly kits at today's appointment and asked to bring one to each appointment for dressing change. Pt instructed that it is ok to shower but he needs to clean his showerhead with bleach.   Patient Instructions:  Return to clinic next week for a full visit with Dr Mitzie Anda  Laurice Pope RN, BSN VAD Coordinator 24/7 Pager [phone]

## 2023-10-03 ENCOUNTER — Telehealth (HOSPITAL_COMMUNITY): Payer: Self-pay | Admitting: Cardiology

## 2023-10-03 ENCOUNTER — Ambulatory Visit (HOSPITAL_COMMUNITY): Payer: Self-pay | Admitting: Pharmacist

## 2023-10-03 ENCOUNTER — Other Ambulatory Visit (HOSPITAL_COMMUNITY): Payer: Self-pay | Admitting: Cardiology

## 2023-10-03 ENCOUNTER — Ambulatory Visit (HOSPITAL_COMMUNITY)
Admission: RE | Admit: 2023-10-03 | Discharge: 2023-10-03 | Disposition: A | Discharge status: Home / Self Care | Source: Ambulatory Visit | Attending: Cardiology | Admitting: Cardiology

## 2023-10-03 VITALS — BP 100/83 | HR 90 | Ht 74.0 in | Wt 225.2 lb

## 2023-10-03 DIAGNOSIS — Z79899 Other long term (current) drug therapy: Secondary | ICD-10-CM | POA: Diagnosis not present

## 2023-10-03 DIAGNOSIS — I10 Essential (primary) hypertension: Secondary | ICD-10-CM

## 2023-10-03 DIAGNOSIS — Z95811 Presence of heart assist device: Secondary | ICD-10-CM | POA: Insufficient documentation

## 2023-10-03 DIAGNOSIS — Z7901 Long term (current) use of anticoagulants: Secondary | ICD-10-CM

## 2023-10-03 DIAGNOSIS — I110 Hypertensive heart disease with heart failure: Secondary | ICD-10-CM | POA: Diagnosis not present

## 2023-10-03 DIAGNOSIS — I428 Other cardiomyopathies: Secondary | ICD-10-CM | POA: Insufficient documentation

## 2023-10-03 DIAGNOSIS — I5022 Chronic systolic (congestive) heart failure: Secondary | ICD-10-CM | POA: Diagnosis not present

## 2023-10-03 DIAGNOSIS — D509 Iron deficiency anemia, unspecified: Secondary | ICD-10-CM

## 2023-10-03 DIAGNOSIS — Z87891 Personal history of nicotine dependence: Secondary | ICD-10-CM | POA: Insufficient documentation

## 2023-10-03 DIAGNOSIS — D649 Anemia, unspecified: Secondary | ICD-10-CM

## 2023-10-03 LAB — COMPREHENSIVE METABOLIC PANEL WITH GFR
ALT: 12 U/L (ref 0–44)
AST: 21 U/L (ref 15–41)
Albumin: 3.6 g/dL (ref 3.5–5.0)
Alkaline Phosphatase: 64 U/L (ref 38–126)
Anion gap: 7 (ref 5–15)
BUN: 13 mg/dL (ref 6–20)
CO2: 22 mmol/L (ref 22–32)
Calcium: 8.9 mg/dL (ref 8.9–10.3)
Chloride: 109 mmol/L (ref 98–111)
Creatinine, Ser: 1.04 mg/dL (ref 0.61–1.24)
GFR, Estimated: >60 mL/min (ref >60)
Glucose, Bld: 147 mg/dL — ABNORMAL HIGH (ref 70–99)
Potassium: 3.9 mmol/L (ref 3.5–5.1)
Sodium: 138 mmol/L (ref 135–145)
Total Bilirubin: 0.7 mg/dL (ref 0.0–1.2)
Total Protein: 7 g/dL (ref 6.5–8.1)

## 2023-10-03 LAB — IRON AND TIBC
Iron: 49 ug/dL (ref 45–182)
Saturation Ratios: 14 % — ABNORMAL LOW (ref 17.9–39.5)
TIBC: 342 ug/dL (ref 250–450)
UIBC: 293 ug/dL

## 2023-10-03 LAB — CBC
HCT: 32.4 % — ABNORMAL LOW (ref 39.0–52.0)
Hemoglobin: 10 g/dL — ABNORMAL LOW (ref 13.0–17.0)
MCH: 26.2 pg (ref 26.0–34.0)
MCHC: 30.9 g/dL (ref 30.0–36.0)
MCV: 85 fL (ref 80.0–100.0)
Platelets: 204 10*3/uL (ref 150–400)
RBC: 3.81 MIL/uL — ABNORMAL LOW (ref 4.22–5.81)
RDW: 13.8 % (ref 11.5–15.5)
WBC: 5.9 10*3/uL (ref 4.0–10.5)
nRBC: 0 % (ref 0.0–0.2)

## 2023-10-03 LAB — LACTATE DEHYDROGENASE: LDH: 158 U/L (ref 98–192)

## 2023-10-03 LAB — PROTIME-INR
INR: 2.1 — ABNORMAL HIGH (ref 0.8–1.2)
Prothrombin Time: 23.7 s — ABNORMAL HIGH (ref 11.4–15.2)

## 2023-10-03 LAB — MAGNESIUM: Magnesium: 1.7 mg/dL (ref 1.7–2.4)

## 2023-10-03 LAB — FOLATE: Folate: 14 ng/mL (ref >5.9)

## 2023-10-03 LAB — VITAMIN B12: Vitamin B-12: 440 pg/mL (ref 180–914)

## 2023-10-03 LAB — PREALBUMIN: Prealbumin: 20 mg/dL (ref 18–38)

## 2023-10-03 LAB — FERRITIN: Ferritin: 13 ng/mL — ABNORMAL LOW (ref 24–336)

## 2023-10-03 MED ORDER — SPIRONOLACTONE 25 MG PO TABS: 12.5000 mg | ORAL_TABLET | Freq: Every day | ORAL | 6 refills | Status: AC

## 2023-10-03 NOTE — Telephone Encounter (Signed)
 Patient referred to infusion pharmacy team for ambulatory infusion of IV iron.  Insurance - [employer] Medicare  Dx code - D64.9/D50.9 IV Iron Therapy - Feraheme  510 mg IV x 2  Infusion appointments -  [facility] Scheduling team will schedule patient as soon as possible.   Deneene Tarver D. Jaishaun Mcnab, PharmD

## 2023-10-03 NOTE — Progress Notes (Signed)
 VAD Clinic Note    ID:  Josue, Falconi December 31, 1970, MRN [id number]  Location: Home  Provider location: [place] [facility] Type of Visit: Established patient  PCP: [facility]  Cardiologist:  Jules Oar, MD Primary HF: Dr Julane Ny   Chief Complaint: Heart Failure/LVAD  History of Present Illness:  KYSON KUPPER is a 53 y.o. 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  GI issues: - 4/20 EGD with gastritis and nonbleeding duodenal ulcers, colonoscopy with 5 polyps removed. - 10/21 ETOH pancreatitis. Gallbladder ok. Resolved with medical management   Infection issues: - Admitted 12/20 for DL infection with overlying cellulitis and volume overload.  - Admitted 4/21 for DL infection with large subxiphoid abscess. Underwent several debridements and eventually a pec muscle flap. Wound cx + MSSA. Initially on ancef  and rifabutin  but expanded to cefipime then changed to IV ancef  and po rifabutin .  - Readmitted 6/21 with superficial abscess and tachypnea. Superficial abscess I&D'd and packed. CT scan done and showed possible deeper fluid collection. Reviewed with TCTS who felt it was muscle flap. Sent to IR for possible drainage but u/s confirmed it was the muscle flap and no drainable fluid collection. Wound cx negative. ID saw and recommended continuing Keflex .  - Admitted 8/24 for DL infection He was admitted  for debridement and observation after. Wound packed with VASHE wet/dry dressing. Completed 3 days of IV cefepime  while admitted. Discharged on po cipro  750 bid = conbtinue Keflex  for previous MSSA  RV failure: - Admitted 8/21 with lactic acidosis due to cardiogenic shock. Started on 0.25 of milrinone  and NE. Ramp echo on 8/13 with severe RV dysfunction. Speed increased 5600-> 6300. Tolerated ok. Repeat ramp echo on 8/20 speed increased to 6500. Diuresed  over 40 pounds.   Here for regular followup.  Not smoking for a number of months now but daughter is smoking in the house.  Continues to work at [employer] and [employer].  No significant exertional dyspnea.  No lightheadedness.  MAP stable at 84.  Weight down 2 lbs.  He reports some blood in his stool last week but this has resolved/cleared up.    Labs (4/25): K 4.1, creatinine 1.02 Labs (5/25): hgb 10, INR 2.1  VAD Indication: Destination Therapy    VAD interrogation & Equipment Management: Speed: 6500 Flow: 6.2 Power: 5.9 w    PI: 2.0 Occasional PI events Alarms: none   Primary Controller:  Replace back up battery in 18 months Back up controller: Replace back up battery in 8 months-did not bring   Annual Equipment Maintenance on UBC/PM was performed 10/25/22    I reviewed the LVAD parameters from today and compared the results to the patient's prior recorded data.  LVAD interrogation was NEGATIVE for significant power changes, NEGATIVE for clinical alarms and STABLE for PI events/speed drops. No programming changes were made and pump is functioning within specified parameters.     Past Medical History:  Diagnosis Date   Asthma    CHF (congestive heart failure) (HCC)    a. 09/2016: EF 20-25% with cath showing normal cors   GERD (gastroesophageal reflux disease)    History of hiatal hernia    LVAD (left ventricular assist device) present (HCC)    OSA on CPAP 09/06/2018   Severe OSA with AHI 68/hr on CPAP at 12cm H2O   Past Surgical History:  Procedure Laterality Date   APPLICATION OF A-CELL OF CHEST/ABDOMEN N/A 08/24/2019   Procedure: Application Of A-Cell Of Chest/Abdomen;  Surgeon: Heriberto London, MD;  Location: [facility] OR;  Service: Thoracic;  Laterality: N/A;   APPLICATION OF A-CELL OF CHEST/ABDOMEN N/A 09/05/2019   Procedure: Application Of A-Cell Of Chest/Abdomen;  Surgeon: Thornell Flirt, DO;  Location: [facility] OR;  Service: Plastics;  Laterality: N/A;   APPLICATION OF A-CELL OF  CHEST/ABDOMEN  09/03/2019   Procedure: Application Of A-Cell Of Chest/Abdomen;  Surgeon: Heriberto London, MD;  Location: [facility] OR;  Service: Thoracic;;   APPLICATION OF WOUND VAC N/A 08/22/2019   Procedure: Debridement, Irrigation and Packing of Abdominal Incision.;  Surgeon: Heriberto London, MD;  Location: [facility] OR;  Service: Thoracic;  Laterality: N/A;   APPLICATION OF WOUND VAC N/A 08/24/2019   Procedure: Irrigation and Debridement with WOUND VAC APPLICATION;  Surgeon: Heriberto London, MD;  Location: [facility] OR;  Service: Thoracic;  Laterality: N/A;   APPLICATION OF WOUND VAC N/A 08/30/2019   Procedure: APPLICATION OF WOUND VAC;  Surgeon: Heriberto London, MD;  Location: [facility] OR;  Service: Thoracic;  Laterality: N/A;   APPLICATION OF WOUND VAC N/A 09/03/2019   Procedure: WOUND VAC CHANGE;  Surgeon: Heriberto London, MD;  Location: [facility] OR;  Service: Thoracic;  Laterality: N/A;   BIOPSY  08/23/2018   Procedure: BIOPSY;  Surgeon: Normie Becton., MD;  Location: [facility] ENDOSCOPY;  Service: Gastroenterology;;   COLONOSCOPY N/A 08/23/2018   Procedure: COLONOSCOPY;  Surgeon: Normie Becton., MD;  Location: [facility] ENDOSCOPY;  Service: Gastroenterology;  Laterality: N/A;   ENTEROSCOPY N/A 08/23/2018   Procedure: ENTEROSCOPY;  Surgeon: Brice Campi Albino Alu., MD;  Location: [facility] ENDOSCOPY;  Service: Gastroenterology;  Laterality: N/A;   HEMOSTASIS CLIP PLACEMENT  08/23/2018   Procedure: HEMOSTASIS CLIP PLACEMENT;  Surgeon: Normie Becton., MD;  Location: [facility] ENDOSCOPY;  Service: Gastroenterology;;   IABP INSERTION N/A 09/05/2017   Procedure: IABP INSERTION;  Surgeon: Mardell Shade, MD;  Location: [facility] INVASIVE CV LAB;  Service: Cardiovascular;  Laterality: N/A;   INSERTION OF IMPLANTABLE LEFT VENTRICULAR ASSIST DEVICE N/A 09/06/2017   Procedure: INSERTION OF IMPLANTABLE LEFT VENTRICULAR ASSIST DEVICE - HM3;  Surgeon: Heriberto London, MD;  Location: [facility] OR;  Service: Open Heart Surgery;  Laterality: N/A;  HM3    MUSCLE FLAP CLOSURE N/A 09/05/2019   Procedure: MUSCLE FLAP CLOSURE;  Surgeon: Thornell Flirt, DO;  Location: [facility] OR;  Service: Plastics;  Laterality: N/A;   NASAL FRACTURE SURGERY  1987   POLYPECTOMY  08/23/2018   Procedure: POLYPECTOMY;  Surgeon: Mansouraty, Albino Alu., MD;  Location: [facility] ENDOSCOPY;  Service: Gastroenterology;;   RIGHT HEART CATH N/A 08/30/2017   Procedure: RIGHT HEART CATH;  Surgeon: Mardell Shade, MD;  Location: [facility] INVASIVE CV LAB;  Service: Cardiovascular;  Laterality: N/A;   RIGHT/LEFT HEART CATH AND CORONARY ANGIOGRAPHY N/A 10/08/2016   Procedure: Right/Left Heart Cath and Coronary Angiography;  Surgeon: Pasqual Bone, MD;  Location: [facility] INVASIVE CV LAB;  Service: Cardiovascular;  Laterality: N/A;   STERNAL WOUND DEBRIDEMENT N/A 08/20/2019   Procedure: DEBRIDEMENT OF LVAD DRIVELINE TUNNEL;  Surgeon: Heriberto London, MD;  Location: [facility] OR;  Service: Thoracic;  Laterality: N/A;   STERNAL WOUND DEBRIDEMENT N/A 09/05/2019   Procedure: DEBRIDEMENT AND CLOSURE OF ABDOMINAL WOUND;  Surgeon: Thornell Flirt, DO;  Location: [facility] OR;  Service: Plastics;  Laterality: N/A;   STERNAL WOUND DEBRIDEMENT N/A 01/13/2023   Procedure: VAD TUNNEL  WOUND DEBRIDEMENT;  Surgeon: Shon Downing, MD;  Location: [facility] OR;  Service: Thoracic;  Laterality: N/A;   SUBMUCOSAL TATTOO INJECTION  08/23/2018   Procedure: SUBMUCOSAL TATTOO INJECTION;  Surgeon: Normie Becton., MD;  Location: [facility] ENDOSCOPY;  Service: Gastroenterology;;   TEE WITHOUT CARDIOVERSION N/A 09/06/2017   Procedure: TRANSESOPHAGEAL ECHOCARDIOGRAM (TEE);  Surgeon: Matt Song, Donata Fryer, MD;  Location: [facility] OR;  Service: Open Heart Surgery;  Laterality: N/A;   WOUND DEBRIDEMENT N/A 08/30/2019   Procedure: Debridement Abdominal Wound;  Surgeon: Heriberto London, MD;  Location: [facility] OR;  Service: Thoracic;  Laterality: N/A;     Current Outpatient Medications  Medication Sig Dispense Refill   cephALEXin  (KEFLEX ) 500 MG capsule Take 1  capsule (500 mg total) by mouth 3 (three) times daily. 90 capsule 11   doxazosin  (CARDURA ) 2 MG tablet Take 0.5 tablets (1 mg total) by mouth at bedtime. (Patient taking differently: Take 2 mg by mouth at bedtime.) 45 tablet 3   gabapentin  (NEURONTIN ) 300 MG capsule TAKE ONE CAPSULE BY MOUTH THREE TIMES DAILY AT 9am, 3pm, AND 9pm 90 capsule 5   Multiple Vitamins-Minerals (ONE-A-DAY MENS HEALTH FORMULA PO) Take by mouth.     pantoprazole  (PROTONIX ) 40 MG tablet Take 1 tablet (40 mg total) by mouth 2 (two) times daily. 180 tablet 3   sacubitril -valsartan  (ENTRESTO ) 97-103 MG Take 1 tablet by mouth 2 (two) times daily. 60 tablet 6   sildenafil  (REVATIO ) 20 MG tablet Take 1 tablet (20 mg total) by mouth 3 (three) times daily. 90 tablet 11   warfarin (COUMADIN ) 5 MG tablet TAKE 1 AND 1/2 TABLETS BY MOUTH DAILY AT 4pm OR as directed by HF CLINIC 90 tablet 5   spironolactone  (ALDACTONE ) 25 MG tablet Take 0.5 tablets (12.5 mg total) by mouth daily. 45 tablet 6   No current facility-administered medications for this encounter.    Allergies:   Patient has no known allergies.   Social History:  The patient  reports that he has quit smoking. His smoking use included cigarettes. He has a 12.5 pack-year smoking history. He has never used smokeless tobacco. He reports that he does not currently use alcohol. He reports that he does not currently use drugs after having used the following drugs: Marijuana.   Family History:  The patient's family history includes Alcohol abuse in his cousin; Hypertension in his father.   ROS:  Please see the history of present illness.   All other systems are personally reviewed and negative.   Vital Signs: BP 100/83 Comment: 90  Pulse 90   Ht 6\' 2"  (1.88 m)   Wt 102.2 kg (225 lb 3.2 oz)   SpO2 98%   BMI 28.91 kg/m  MAP 84  Exam: General: Well appearing this am. NAD.  HEENT: Normal. Neck: Supple, JVP 7-8 cm. Carotids OK.  Cardiac:  Mechanical heart sounds with LVAD  hum present.  Lungs:  CTAB, normal effort.  Abdomen:  NT, ND, no HSM. No bruits or masses. +BS  LVAD exit site: Well-healed and incorporated. Dressing dry and intact. No erythema or drainage. Stabilization device present and accurately applied. Driveline dressing changed daily per sterile technique. Extremities:  Warm and dry. No cyanosis, clubbing, rash, or edema.  Neuro:  Alert & oriented x 3. Cranial nerves grossly intact. Moves all 4 extremities w/o difficulty. Affect pleasant    Recent Labs: 10/03/2023: ALT 12; BUN 13; Creatinine, Ser 1.04; Hemoglobin 10.0; Magnesium  1.7; Platelets 204; Potassium 3.9; Sodium 138    Wt  Readings from Last 3 Encounters:  10/03/23 102.2 kg (225 lb 3.2 oz)  07/21/23 103.2 kg (227 lb 9.6 oz)  05/26/23 101.3 kg (223 lb 4.8 oz)    ASSESSMENT AND PLAN:  1. 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 Treated with milrinone  and then weaned off.  Diuresed 40 pounds. VAD speed increased - Stable NYHA I despite RV failure.  - Not volume overloaded on exam.  - Continue Entresto  and spironolactone .  - I will arrange for repeat echo at his followup in 2 months.  - He is very motivated to proceed with transplant. Has stopped smoking for for almost a year but daughter still smokes in house and cotinine level was elevated last check.  Wants to get next cotinine level at next appointment now that he has asked his daughter to smoke outside. Blood Type B+.  We have discussed transplant referral process including need to check PRA levels  2. HM3 LVAD 09/06/2017.  - VAD interrogated personally. Parameters stable. Still has occasional PI events.  - LDH pending - INR pending, goal 2.0-3.0.  Discussed warfarin dosing with PharmD personally. - Off ASA with PUD.  - DL site looks good. Off doxy/cipro . Continue suppressive Keflex . Low threshold to add back cipro  if develops any  drainage  3.  Essential HTN - MAP stable.  - Continue current doxazosin .   4. Recurrent DL infection with subxiphoid abscess - s/p debridement and pec flap in 4/21 - resolved. Completed doxy/cipro  - Continue Keflex  as above  5.  H/o GI bleed  - 08/2018 EGD showed gastritis and nonbleeding duodenal ulcers, colonoscopy with 5 polyps removed.  - Remains on PPI.No recent GI bleeding - CBC today.   6. Tobacco Abuse  - has now quit for close to a year but daughter still smokes in the house, last cotinine elevated.  - will start checking monthly cotinine levels once daughter is smoking outside.   I spent a total of 41 minutes today: 1) reviewing the patient's medical records including previous charts, labs and recent notes from other providers; 2) examining the patient and counseling them on their medical issues/explaining the plan of care; 3) adjusting meds as needed and 4) ordering lab work or other needed tests.    Signed, Peder Bourdon, MD  10/03/2023 5:50 PM  [facility] [address] Heart [facility] [place] [address] [phone] (office) [phone] (fax)

## 2023-10-03 NOTE — Progress Notes (Signed)
 Patient presents for 2 month f/u with 5 year intermacs and annual main in VAD clinic today alone. Denies any issues with VAD or equipment.   Pt states he is feeling great. Denies lightheadedness, dizziness, falls, heart failure symptoms, and signs of bleeding. Continues to remain active by working at [employer] and [employer] in [place].  He tells me that in June he will be ready for us  to start doing monthly nicotine  checks.   Pt with Sat ratio of 14. Referral sent to iv iron therapy team.  Vital Signs: Doppler: 84 Automatic BP: 100/83 (90) HR: 90 SPO2: 98 % RA   Weight: 225.2 lbs w/ eqt Last wt: 227.6 lbs w/ equip   VAD Indication: Destination Therapy    VAD interrogation & Equipment Management: Speed: 6500 Flow: 6.2 Power: 5.9w    PI: 2 Hct: 32  Alarms: none Events: 40-50 daily  Primary Controller:  Replace back up battery in 33 months Back up controller: Expired. Replaced with [id number]. Replace back up battery in 33 months  Annual Equipment Maintenance on UBC/PM was performed 10/03/23   I reviewed the LVAD parameters from today and compared the results to the patient's prior recorded data.  LVAD interrogation was NEGATIVE for significant power changes, NEGATIVE for clinical alarms and STABLE for PI events/speed drops. No programming changes were made and pump is functioning within specified parameters.   Exit Site Care: Existing VAD dressing removed and site care performed using sterile technique. Drive line exit site cleaned with Chlora prep applicators x 2, allowed to dry, and Sorbaview dressing with Silverlon patch applied. Exit site healed and incorporated, the velour is fully implanted at exit site. Site does not tunnel and is incorporated. No redness, tenderness, drainage, foul odor or rash noted. Drive line anchor re-applied x 2. 2 large tegaderms applied. Pt given pack of tegaderms for home use. Pt has sufficient kits at home.  Device: none  BP & Labs:  Doppler 84  - reflecting map   Hgb 10 - No S/S of bleeding. Specifically denies melena/BRBPR or nosebleeds.    LDH 207 - established baseline of 200-300. Denies tea-colored urine. No power elevations noted on interrogation.   Batteries Manufacture Date: Number of uses: Re-calibration  07/02/20 70 Performed by patient   Annual maintenance completed per Biomed on patient's home power module and Warehouse manager.    Backup system controller 11 volt battery charged during visit.   5 year Intermacs follow up completed including:  Quality of Life, KCCQ-12, and Neurocognitive trail making.   Pt completed 1600 feet during 6 minute walk.   Patient Goals: Be listed for transplant Continue working at the [employer]  [place] Cardiomyopathy Questionnaire     10/03/2023   10:54 AM 05/04/2023   11:46 AM 09/03/2022   11:58 AM  KCCQ-12  1 a. Ability to shower/bathe Slightly limited Not at all limited Not at all limited  1 b. Ability to walk 1 block Not at all limited Not at all limited Not at all limited  1 c. Ability to hurry/jog Slightly limited Slightly limited Not at all limited  2. Edema feet/ankles/legs Never over the past 2 weeks Never over the past 2 weeks Never over the past 2 weeks  3. Limited by fatigue Less than once a week Never over the past 2 weeks Never over the past 2 weeks  4. Limited by dyspnea Less than once a week Never over the past 2 weeks Never over the past  2 weeks  5. Sitting up / on 3+ pillows Never over the past 2 weeks Never over the past 2 weeks Never over the past 2 weeks  6. Limited enjoyment of life Slightly limited Slightly limited Not limited at all  7. Rest of life w/ symptoms Mostly satisfied Mostly satisfied Completely satisfied  8 a. Participation in hobbies Slightly limited Slightly limited Did not limit at all  8 b. Participation in chores Slightly limited Slightly limited Did not limit at all  8 c. Visiting family/friends Did not limit at all N/A,  did not do for other reasons Did not limit at all      Patient Instructions:  No change in medications Referral sent to IV iron therapy team Continue current dose of Coumadin  Return to clinic in 1 wk for dressing change Return to clinic in 2 months with echo  Adams Adams RN,BSN VAD Coordinator  Office: [phone]  24/7 Pager: [phone]

## 2023-10-03 NOTE — Patient Instructions (Signed)
 No change in medications Continue current dose of Coumadin  Return to clinic in 2 months with echo

## 2023-10-10 ENCOUNTER — Ambulatory Visit (HOSPITAL_COMMUNITY): Payer: Self-pay | Admitting: Internal Medicine

## 2023-10-11 ENCOUNTER — Other Ambulatory Visit (HOSPITAL_COMMUNITY): Payer: Self-pay | Admitting: *Deleted

## 2023-10-11 ENCOUNTER — Other Ambulatory Visit: Payer: Self-pay

## 2023-10-11 ENCOUNTER — Telehealth (HOSPITAL_COMMUNITY): Payer: Self-pay | Admitting: Internal Medicine

## 2023-10-11 ENCOUNTER — Telehealth (INDEPENDENT_AMBULATORY_CARE_PROVIDER_SITE_OTHER): Payer: 59 | Admitting: Infectious Diseases

## 2023-10-11 ENCOUNTER — Other Ambulatory Visit (HOSPITAL_COMMUNITY)

## 2023-10-11 ENCOUNTER — Other Ambulatory Visit (HOSPITAL_COMMUNITY): Payer: Self-pay | Admitting: Internal Medicine

## 2023-10-11 DIAGNOSIS — D509 Iron deficiency anemia, unspecified: Secondary | ICD-10-CM

## 2023-10-11 DIAGNOSIS — T827XXA Infection and inflammatory reaction due to other cardiac and vascular devices, implants and grafts, initial encounter: Secondary | ICD-10-CM

## 2023-10-11 DIAGNOSIS — Z95811 Presence of heart assist device: Secondary | ICD-10-CM

## 2023-10-11 DIAGNOSIS — I5022 Chronic systolic (congestive) heart failure: Secondary | ICD-10-CM

## 2023-10-11 DIAGNOSIS — I5042 Chronic combined systolic (congestive) and diastolic (congestive) heart failure: Secondary | ICD-10-CM

## 2023-10-11 DIAGNOSIS — Z7901 Long term (current) use of anticoagulants: Secondary | ICD-10-CM

## 2023-10-11 DIAGNOSIS — I50812 Chronic right heart failure: Secondary | ICD-10-CM

## 2023-10-11 DIAGNOSIS — Z91199 Patient's noncompliance with other medical treatment and regimen due to unspecified reason: Secondary | ICD-10-CM

## 2023-10-11 MED ORDER — CEPHALEXIN 500 MG PO CAPS: 500.0000 mg | ORAL_CAPSULE | Freq: Three times a day (TID) | ORAL | 11 refills | Status: DC

## 2023-10-11 NOTE — Progress Notes (Signed)
 Patient did not show for virtual appt.  No option for VM to be left I sent him a mychart message requesting visit to be rescheduled.   Refills sent in for cephalexin  TID based on review of chart and known chronic driveline infection   No charge   Gibson Kurtz, MSN, NP-C [facility] for Infectious Disease [facility]  [email or web address] Pager: [phone] Office: [phone] [facility] Main Line: [phone] *Secure Chat Communication Welcome

## 2023-10-11 NOTE — Telephone Encounter (Signed)
 Patient referred to infusion pharmacy team for ambulatory infusion of IV iron.  Insurance - [employer] Medicare  Dx code - D50.9/Z95.81  IV Iron Therapy - Feraheme  510 mg IV x2  Infusion appointments - [facility] Scheduling team will schedule patient as soon as possible.   Annmargaret Decaprio D. Michelyn Scullin, PharmD

## 2023-10-11 NOTE — Progress Notes (Signed)
 Per anemia panel results pt needs IV iron per Dr Julane Ny. Ambulatory referral placed for iron therapy. Secure chat sent to infusion pharmacy team to schedule pt.   Paulo Bosworth RN VAD Coordinator  Office: [phone]  24/7 Pager: [phone]

## 2023-10-14 ENCOUNTER — Other Ambulatory Visit (HOSPITAL_COMMUNITY): Payer: Self-pay | Admitting: *Deleted

## 2023-10-14 ENCOUNTER — Encounter (HOSPITAL_COMMUNITY)

## 2023-10-14 DIAGNOSIS — Z95811 Presence of heart assist device: Secondary | ICD-10-CM

## 2023-10-14 DIAGNOSIS — Z7901 Long term (current) use of anticoagulants: Secondary | ICD-10-CM

## 2023-10-17 ENCOUNTER — Ambulatory Visit (HOSPITAL_COMMUNITY)
Admission: RE | Admit: 2023-10-17 | Discharge: 2023-10-17 | Disposition: A | Discharge status: Home / Self Care | Source: Ambulatory Visit | Attending: Cardiology | Admitting: Cardiology

## 2023-10-17 ENCOUNTER — Ambulatory Visit (HOSPITAL_COMMUNITY): Payer: Self-pay | Admitting: Pharmacist

## 2023-10-17 ENCOUNTER — Encounter (HOSPITAL_COMMUNITY)
Admission: RE | Admit: 2023-10-17 | Discharge: 2023-10-17 | Disposition: A | Discharge status: Home / Self Care | Source: Ambulatory Visit | Attending: Cardiology | Admitting: Cardiology

## 2023-10-17 DIAGNOSIS — D509 Iron deficiency anemia, unspecified: Secondary | ICD-10-CM | POA: Insufficient documentation

## 2023-10-17 DIAGNOSIS — Z4801 Encounter for change or removal of surgical wound dressing: Secondary | ICD-10-CM | POA: Insufficient documentation

## 2023-10-17 DIAGNOSIS — Z95811 Presence of heart assist device: Secondary | ICD-10-CM | POA: Insufficient documentation

## 2023-10-17 DIAGNOSIS — Z7901 Long term (current) use of anticoagulants: Secondary | ICD-10-CM | POA: Diagnosis not present

## 2023-10-17 LAB — PROTIME-INR
INR: 2.7 — ABNORMAL HIGH (ref 0.8–1.2)
Prothrombin Time: 28.5 s — ABNORMAL HIGH (ref 11.4–15.2)

## 2023-10-17 MED ORDER — SODIUM CHLORIDE 0.9 % IV SOLN
510.0000 mg | INTRAVENOUS | Status: DC
  Administered 2023-10-17: 510 mg via INTRAVENOUS
  Filled 2023-10-17: qty 510

## 2023-10-17 NOTE — Progress Notes (Signed)
 Pt presents for dressing change and INR in VAD clinic today alone.   Pt has completed courses of Doxy and Cipro . Per ID pt will need to restart Cipro  if the drainage returns. Pt remains on Keflex  500mg  TID.  Exit Site Care: Existing VAD dressing removed and site care performed using sterile technique. Drive line exit site cleaned with Chlora prep applicators x 2, allowed to dry, and Sorbaview dressing with Silverlon patch applied. Exit site healed and incorporated, the velour is fully implanted at exit site. Site does not tunnel and is incorporated. No redness, tenderness, drainage, foul odor or rash noted. Drive line anchor re-applied x 2. 2 large tegaderms applied. Pt given 4 weekly kits at today's appointment and asked to bring one to each appointment for dressing change. Pt instructed that it is ok to shower but he needs to clean his showerhead with bleach.   Patient Instructions:  Return to clinic next week for a dressing change Coumadin  dosing per Lauren- pharm D  Adams Adams RN, BSN VAD Coordinator 24/7 Pager [phone]

## 2023-10-24 ENCOUNTER — Encounter (HOSPITAL_COMMUNITY)

## 2023-10-25 NOTE — Telephone Encounter (Signed)
 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 He will call back to make an appointment when he has his schedule in front of him. Notified him that Keflex  refills were sent.   Arsal Tappan, BSN, RN

## 2023-10-26 ENCOUNTER — Encounter (HOSPITAL_COMMUNITY)
Admission: RE | Admit: 2023-10-26 | Discharge: 2023-10-26 | Disposition: A | Discharge status: Home / Self Care | Source: Ambulatory Visit | Attending: Internal Medicine

## 2023-10-26 ENCOUNTER — Ambulatory Visit (HOSPITAL_COMMUNITY)
Admission: RE | Admit: 2023-10-26 | Discharge: 2023-10-26 | Disposition: A | Discharge status: Home / Self Care | Source: Ambulatory Visit | Attending: Cardiology | Admitting: Cardiology

## 2023-10-26 DIAGNOSIS — D509 Iron deficiency anemia, unspecified: Secondary | ICD-10-CM

## 2023-10-26 DIAGNOSIS — Z95811 Presence of heart assist device: Secondary | ICD-10-CM | POA: Diagnosis present

## 2023-10-26 DIAGNOSIS — Z4801 Encounter for change or removal of surgical wound dressing: Secondary | ICD-10-CM | POA: Insufficient documentation

## 2023-10-26 MED ORDER — SODIUM CHLORIDE 0.9 % IV SOLN
510.0000 mg | INTRAVENOUS | Status: AC
  Administered 2023-10-26: 510 mg via INTRAVENOUS
  Filled 2023-10-26: qty 510

## 2023-10-26 NOTE — Progress Notes (Signed)
 Pt presents for dressing change in VAD clinic today alone.   Pt has completed courses of Doxy and Cipro . Per ID pt will need to restart Cipro  if the drainage returns. Pt remains on Keflex  500mg  TID.  Exit Site Care: Existing VAD dressing removed and site care performed using sterile technique. Drive line exit site cleaned with Chlora prep applicators x 2, allowed to dry, and Sorbaview dressing with Silverlon patch applied. Exit site healed and incorporated, the velour is fully implanted at exit site. Site does not tunnel and is incorporated. No redness, tenderness, drainage, foul odor or rash noted. Drive line anchor re-applied x 2. 2 large tegaderms applied. Pt has adequate dressing supplies at home. Pt instructed that it is ok to shower but he needs to clean his showerhead with bleach.   Patient Instructions:  Return to clinic next week for a dressing change and INR   Paulo Bosworth RN VAD Coordinator  Office: [phone]  24/7 Pager: [phone]

## 2023-10-28 ENCOUNTER — Other Ambulatory Visit (HOSPITAL_COMMUNITY): Payer: Self-pay | Admitting: *Deleted

## 2023-10-28 DIAGNOSIS — Z95811 Presence of heart assist device: Secondary | ICD-10-CM

## 2023-10-28 DIAGNOSIS — Z7901 Long term (current) use of anticoagulants: Secondary | ICD-10-CM

## 2023-11-02 ENCOUNTER — Ambulatory Visit (HOSPITAL_COMMUNITY)
Admission: RE | Admit: 2023-11-02 | Discharge: 2023-11-02 | Disposition: A | Discharge status: Home / Self Care | Source: Ambulatory Visit | Attending: Cardiology | Admitting: Cardiology

## 2023-11-02 ENCOUNTER — Other Ambulatory Visit (HOSPITAL_COMMUNITY): Payer: Self-pay | Admitting: *Deleted

## 2023-11-02 ENCOUNTER — Ambulatory Visit (HOSPITAL_COMMUNITY): Payer: Self-pay | Admitting: Pharmacist

## 2023-11-02 DIAGNOSIS — Z95811 Presence of heart assist device: Secondary | ICD-10-CM

## 2023-11-02 DIAGNOSIS — Z7901 Long term (current) use of anticoagulants: Secondary | ICD-10-CM

## 2023-11-02 LAB — PROTIME-INR
INR: 2.4 — ABNORMAL HIGH (ref 0.8–1.2)
Prothrombin Time: 26.8 s — ABNORMAL HIGH (ref 11.4–15.2)

## 2023-11-02 NOTE — Progress Notes (Signed)
 Pt presents for dressing change and INR in VAD clinic today alone.   Pt has completed courses of Doxy and Cipro . Per ID pt will need to restart Cipro  if the drainage returns. Pt remains on Keflex  500mg  TID.  Exit Site Care: Existing VAD dressing removed and site care performed using sterile technique. Drive line exit site cleaned with Chlora prep applicators x 2, allowed to dry, and Sorbaview dressing with Silverlon patch applied. Exit site healed and incorporated, the velour is fully implanted at exit site. Site does not tunnel and is incorporated. No redness, tenderness, drainage, foul odor or rash noted. Drive line anchor re-applied x 2. 2 large tegaderms applied. Pt has adequate dressing supplies at home. Pt instructed that it is ok to shower but he needs to clean his showerhead with bleach.   Patient Instructions:  Return to clinic next week for a dressing change Coumadin  dosing per Lauren PharmD   Paulo Bosworth RN VAD Coordinator  Office: [phone]  24/7 Pager: [phone]

## 2023-11-03 ENCOUNTER — Ambulatory Visit (HOSPITAL_COMMUNITY)
Admission: RE | Admit: 2023-11-03 | Discharge: 2023-11-03 | Disposition: A | Discharge status: Home / Self Care | Source: Ambulatory Visit | Attending: Cardiology | Admitting: Cardiology

## 2023-11-03 DIAGNOSIS — Z95811 Presence of heart assist device: Secondary | ICD-10-CM

## 2023-11-03 DIAGNOSIS — Z7901 Long term (current) use of anticoagulants: Secondary | ICD-10-CM | POA: Insufficient documentation

## 2023-11-03 DIAGNOSIS — Z4801 Encounter for change or removal of surgical wound dressing: Secondary | ICD-10-CM | POA: Insufficient documentation

## 2023-11-03 NOTE — Progress Notes (Signed)
 Pt presents for dressing change and INR in VAD clinic today alone.   Pt has completed courses of Doxy and Cipro . Per ID pt will need to restart Cipro  if the drainage returns. Pt remains on Keflex  500mg  TID.  Pt was in clinic yesterday for driveline change, however, it was completed incorrectly and pt thought his driveline was exposed. It was not, but his modular cable was not underneath the tegaderm and this felt different for the pt so he came to clinic.  He is requesting a Oncologist today.   Exit Site Care: Existing VAD dressing removed and site care performed using sterile technique. Drive line exit site cleaned with Chlora prep applicators x 2, allowed to dry, and Sorbaview dressing with Silverlon patch applied. Exit site healed and incorporated, the velour is fully implanted at exit site. Site does not tunnel and is incorporated. No redness, tenderness, drainage, foul odor or rash noted. Drive line anchor re-applied x 2. 2 large tegaderms applied. Pt has adequate dressing supplies at home. Pt instructed that it is ok to shower but he needs to clean his showerhead with bleach.   Patient Instructions:  Return to clinic next in 10 days for a dressing change Coumadin  dosing per Lauren PharmD   Adams Adams RN VAD Coordinator  Office: [phone]  24/7 Pager: [phone]

## 2023-11-09 ENCOUNTER — Encounter (HOSPITAL_COMMUNITY)

## 2023-11-11 ENCOUNTER — Other Ambulatory Visit (HOSPITAL_COMMUNITY): Payer: Self-pay | Admitting: Unknown Physician Specialty

## 2023-11-11 DIAGNOSIS — Z95811 Presence of heart assist device: Secondary | ICD-10-CM

## 2023-11-11 DIAGNOSIS — Z7901 Long term (current) use of anticoagulants: Secondary | ICD-10-CM

## 2023-11-14 ENCOUNTER — Other Ambulatory Visit (HOSPITAL_COMMUNITY): Payer: Self-pay | Admitting: Internal Medicine

## 2023-11-14 DIAGNOSIS — G629 Polyneuropathy, unspecified: Secondary | ICD-10-CM

## 2023-11-14 DIAGNOSIS — R52 Pain, unspecified: Secondary | ICD-10-CM

## 2023-11-15 ENCOUNTER — Ambulatory Visit (HOSPITAL_COMMUNITY): Payer: Self-pay | Admitting: Pharmacist

## 2023-11-15 ENCOUNTER — Ambulatory Visit (HOSPITAL_COMMUNITY)
Admission: RE | Admit: 2023-11-15 | Discharge: 2023-11-15 | Disposition: A | Discharge status: Home / Self Care | Source: Ambulatory Visit | Attending: Cardiology | Admitting: Cardiology

## 2023-11-15 DIAGNOSIS — Z4801 Encounter for change or removal of surgical wound dressing: Secondary | ICD-10-CM | POA: Diagnosis not present

## 2023-11-15 DIAGNOSIS — Z7901 Long term (current) use of anticoagulants: Secondary | ICD-10-CM | POA: Diagnosis not present

## 2023-11-15 DIAGNOSIS — Z95811 Presence of heart assist device: Secondary | ICD-10-CM | POA: Diagnosis not present

## 2023-11-15 LAB — PROTIME-INR
INR: 2.4 — ABNORMAL HIGH (ref 0.8–1.2)
Prothrombin Time: 27 s — ABNORMAL HIGH (ref 11.4–15.2)

## 2023-11-15 NOTE — Progress Notes (Signed)
 Pt presents for dressing change and INR in VAD clinic today alone.   Pt has completed courses of Doxy and Cipro . Per ID pt will need to restart Cipro  if the drainage returns. Pt remains on Keflex  500mg  TID.  Exit Site Care: Existing VAD dressing removed and site care performed using sterile technique. Drive line exit site cleaned with Chlora prep applicators x 2, allowed to dry, and Sorbaview dressing with Silverlon patch applied. Exit site healed and incorporated, the velour is fully implanted at exit site. Site does not tunnel and is incorporated. No redness, tenderness, drainage, foul odor or rash noted. Drive line anchor re-applied x 2. 2 large tegaderms applied. Pt was given 4 weekly dressing kits for home use. Pt instructed that it is ok to shower but he needs to clean his showerhead with bleach.   Patient Instructions:  Return to clinic next in 10 days for a dressing change Coumadin  dosing per Lauren PharmD   Lauraine Ip RN VAD Coordinator  Office: [phone]  24/7 Pager: [phone]

## 2023-11-24 ENCOUNTER — Encounter (HOSPITAL_COMMUNITY)

## 2023-11-24 ENCOUNTER — Other Ambulatory Visit (HOSPITAL_COMMUNITY): Payer: Self-pay

## 2023-11-24 DIAGNOSIS — Z95811 Presence of heart assist device: Secondary | ICD-10-CM

## 2023-11-24 DIAGNOSIS — Z7901 Long term (current) use of anticoagulants: Secondary | ICD-10-CM

## 2023-11-28 ENCOUNTER — Ambulatory Visit (HOSPITAL_COMMUNITY)
Admission: RE | Admit: 2023-11-28 | Discharge: 2023-11-28 | Disposition: A | Discharge status: Home / Self Care | Source: Ambulatory Visit | Attending: Cardiology | Admitting: Cardiology

## 2023-11-28 ENCOUNTER — Ambulatory Visit (HOSPITAL_COMMUNITY): Payer: Self-pay | Admitting: Pharmacist

## 2023-11-28 DIAGNOSIS — Z95811 Presence of heart assist device: Secondary | ICD-10-CM | POA: Diagnosis present

## 2023-11-28 DIAGNOSIS — Z7901 Long term (current) use of anticoagulants: Secondary | ICD-10-CM | POA: Insufficient documentation

## 2023-11-28 LAB — PROTIME-INR
INR: 2.2 — ABNORMAL HIGH (ref 0.8–1.2)
Prothrombin Time: 25.3 s — ABNORMAL HIGH (ref 11.4–15.2)

## 2023-11-28 NOTE — Addendum Note (Signed)
 Encounter addended by: Dante Jeannine HERO, CMA on: 11/28/2023 9:31 AM  Actions taken: Child order released for a procedure order

## 2023-11-28 NOTE — Progress Notes (Signed)
 Pt presents for dressing change and INR in VAD clinic today alone.   Pt has completed courses of Doxy and Cipro . Per ID pt will need to restart Cipro  if the drainage returns. Pt remains on Keflex  500mg  TID.  Exit Site Care: Existing VAD dressing removed and site care performed using sterile technique. Drive line exit site cleaned with Chlora prep applicators x 2, allowed to dry, and Sorbaview dressing with Silverlon patch applied. Exit site healed and incorporated, the velour is fully implanted at exit site. Site does not tunnel and is incorporated. No redness, tenderness, drainage, foul odor or rash noted. Drive line anchor re-applied x 2. 2 large tegaderms applied. Pt was given 4 weekly dressing kits for home use. Pt instructed that it is ok to shower but he needs to clean his showerhead with bleach.   Patient Instructions:  Return to clinic next Monday for full visit with Dr. Bensimhon Coumadin  dosing per Tinnie PharmD   Schuyler Lunger RN, BSN VAD Coordinator 24/7 Pager [phone]

## 2023-12-01 ENCOUNTER — Other Ambulatory Visit (HOSPITAL_COMMUNITY): Payer: Self-pay

## 2023-12-01 DIAGNOSIS — Z95811 Presence of heart assist device: Secondary | ICD-10-CM

## 2023-12-01 DIAGNOSIS — Z7901 Long term (current) use of anticoagulants: Secondary | ICD-10-CM

## 2023-12-05 ENCOUNTER — Ambulatory Visit (HOSPITAL_COMMUNITY): Payer: Self-pay | Admitting: Pharmacist

## 2023-12-05 ENCOUNTER — Ambulatory Visit (HOSPITAL_BASED_OUTPATIENT_CLINIC_OR_DEPARTMENT_OTHER)
Admission: RE | Admit: 2023-12-05 | Discharge: 2023-12-05 | Disposition: A | Discharge status: Home / Self Care | Source: Ambulatory Visit | Attending: Cardiology | Admitting: Cardiology

## 2023-12-05 ENCOUNTER — Ambulatory Visit (HOSPITAL_COMMUNITY)
Admission: RE | Admit: 2023-12-05 | Discharge: 2023-12-05 | Disposition: A | Discharge status: Home / Self Care | Source: Ambulatory Visit | Attending: Internal Medicine | Admitting: Internal Medicine

## 2023-12-05 DIAGNOSIS — I110 Hypertensive heart disease with heart failure: Secondary | ICD-10-CM | POA: Insufficient documentation

## 2023-12-05 DIAGNOSIS — Z4509 Encounter for adjustment and management of other cardiac device: Secondary | ICD-10-CM | POA: Insufficient documentation

## 2023-12-05 DIAGNOSIS — I5022 Chronic systolic (congestive) heart failure: Secondary | ICD-10-CM

## 2023-12-05 DIAGNOSIS — Z95811 Presence of heart assist device: Secondary | ICD-10-CM

## 2023-12-05 DIAGNOSIS — I428 Other cardiomyopathies: Secondary | ICD-10-CM | POA: Diagnosis not present

## 2023-12-05 DIAGNOSIS — F17210 Nicotine dependence, cigarettes, uncomplicated: Secondary | ICD-10-CM | POA: Insufficient documentation

## 2023-12-05 DIAGNOSIS — Z79899 Other long term (current) drug therapy: Secondary | ICD-10-CM | POA: Insufficient documentation

## 2023-12-05 DIAGNOSIS — Z7901 Long term (current) use of anticoagulants: Secondary | ICD-10-CM

## 2023-12-05 LAB — ECHOCARDIOGRAM COMPLETE
Area-P 1/2: 4.26 cm2
S' Lateral: 3.7 cm

## 2023-12-05 LAB — BASIC METABOLIC PANEL WITH GFR
Anion gap: 11 (ref 5–15)
BUN: 8 mg/dL (ref 6–20)
CO2: 23 mmol/L (ref 22–32)
Calcium: 8.8 mg/dL — ABNORMAL LOW (ref 8.9–10.3)
Chloride: 105 mmol/L (ref 98–111)
Creatinine, Ser: 0.95 mg/dL (ref 0.61–1.24)
GFR, Estimated: >60 mL/min (ref >60)
Glucose, Bld: 112 mg/dL — ABNORMAL HIGH (ref 70–99)
Potassium: 3.9 mmol/L (ref 3.5–5.1)
Sodium: 139 mmol/L (ref 135–145)

## 2023-12-05 LAB — CBC
HCT: 35.9 % — ABNORMAL LOW (ref 39.0–52.0)
Hemoglobin: 11.6 g/dL — ABNORMAL LOW (ref 13.0–17.0)
MCH: 27.7 pg (ref 26.0–34.0)
MCHC: 32.3 g/dL (ref 30.0–36.0)
MCV: 85.7 fL (ref 80.0–100.0)
Platelets: 180 K/uL (ref 150–400)
RBC: 4.19 MIL/uL — ABNORMAL LOW (ref 4.22–5.81)
RDW: 13.6 % (ref 11.5–15.5)
WBC: 5.3 K/uL (ref 4.0–10.5)
nRBC: 0 % (ref 0.0–0.2)

## 2023-12-05 LAB — PROTIME-INR
INR: 2.2 — ABNORMAL HIGH (ref 0.8–1.2)
Prothrombin Time: 25.7 s — ABNORMAL HIGH (ref 11.4–15.2)

## 2023-12-05 LAB — LACTATE DEHYDROGENASE: LDH: 429 U/L — ABNORMAL HIGH (ref 98–192)

## 2023-12-05 NOTE — Progress Notes (Signed)
 Patient presents for 2 month f/u in VAD clinic today alone. Denies any issues with VAD or equipment.   Pt states he is feeling great. Denies lightheadedness, dizziness, falls, heart failure symptoms, and signs of bleeding. Continues to remain active by working at [employer] and [employer] in [place].  Pt is currently smoking cigarettes.    Pt has not taken any of his medications today.  Pt had echo today. Dr Zenaida went over results with Jonathan Wood.   He tells me that he has blood in his stool from time to time. He describes this as bright red and and is on the paper when he wipes and sometimes in the toilet. He tells me that he does have hemorrhoids. May need a colonoscopy in the future as he is 47 and is hoping to be listed for transplant once he quits smoking. Hgb is better today than 2 mo ago and INR is therapeutic. We will send anemia panel at his next lab draw along with repeat LDH as it is up slightly from his baseline.   Vital Signs: Doppler: 67 Automatic BP: 120/97 (106) HR: 67 SPO2: 96 % RA   Weight: 222 lbs w/ eqt Last wt: 225.2 lbs w/ equip   VAD Indication: Destination Therapy    VAD interrogation & Equipment Management: Speed: 6500 Flow: 5.6 Power: 5.7w    PI: 4 Hct: 32  Alarms: none Events: 10-15 daily  Primary Controller:  Replace back up battery in 31 months Back up controller: Replace back up battery in 31 months  Annual Equipment Maintenance on UBC/PM was performed 10/03/23   I reviewed the LVAD parameters from today and compared the results to the patient's prior recorded data.  LVAD interrogation was NEGATIVE for significant power changes, NEGATIVE for clinical alarms and STABLE for PI events/speed drops. No programming changes were made and pump is functioning within specified parameters.   Exit Site Care: Existing VAD dressing removed and site care performed using sterile technique. Drive line exit site cleaned with Chlora prep applicators x 2, allowed to  dry, and Sorbaview dressing with Silverlon patch applied. Exit site healed and incorporated, the velour is fully implanted at exit site. Site does not tunnel and is incorporated. No redness, tenderness, drainage, foul odor or rash noted. Drive line anchor re-applied x 2. 2 large tegaderms applied. Pt given pack of tegaderms for home use. Pt has sufficient kits at home.  Device: none  BP & Labs:  Doppler 110 - reflecting map-pt has not taken any meds today   Hgb 11.6 - No S/S of bleeding. Specifically denies melena/BRBPR or nosebleeds.    LDH 429 - established baseline of 200-300. Denies tea-colored urine. No power elevations noted on interrogation. Up slightly today from baseline will send repeat at next lab draw.   Patient Instructions:  No change in medications Continue current dose of Coumadin  Return to clinic in 10 days for dressing change-INR, anemia panel and LDH Return to clinic in 2 months   Lauraine Ip RN,BSN VAD Coordinator  Office: [phone]  24/7 Pager: [phone]

## 2023-12-05 NOTE — Progress Notes (Signed)
 VAD Clinic Note    ID:  Jonathan, Wood [birth year], MRN [id number]  Location: Home  Provider location: [place] [facility] Type of Visit: Established patient  PCP: [name]  Cardiologist:  Toribio Fuel, MD Primary HF: Dr Fuel   Chief Complaint: Heart Failure/LVAD  History of Present Illness:  Jonathan Wood is a 53 y.o. 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  GI issues: - 4/20 EGD with gastritis and nonbleeding duodenal ulcers, colonoscopy with 5 polyps removed. - 10/21 ETOH pancreatitis. Gallbladder ok. Resolved with medical management   Infection issues: - Admitted 12/20 for DL infection with overlying cellulitis and volume overload.  - Admitted 4/21 for DL infection with large subxiphoid abscess. Underwent several debridements and eventually a pec muscle flap. Wound cx + MSSA. Initially on ancef  and rifabutin  but expanded to cefipime then changed to IV ancef  and po rifabutin .  - Readmitted 6/21 with superficial abscess and tachypnea. Superficial abscess I&D'd and packed. CT scan done and showed possible deeper fluid collection. Reviewed with TCTS who felt it was muscle flap. Sent to IR for possible drainage but u/s confirmed it was the muscle flap and no drainable fluid collection. Wound cx negative. ID saw and recommended continuing Keflex .  - Admitted 8/24 for DL infection He was admitted  for debridement and observation after. Wound packed with VASHE wet/dry dressing. Completed 3 days of IV cefepime  while admitted. Discharged on po cipro  750 bid = continue Keflex  for previous MSSA  RV failure: - Admitted 8/21 with lactic acidosis due to cardiogenic shock. Started on 0.25 of milrinone  and NE. Ramp echo on 8/13 with severe RV dysfunction. Speed increased 5600-> 6300. Tolerated ok. Repeat ramp echo on 8/20 speed increased to 6500. Diuresed  over 40 pounds.   Here for regularly scheduled follow up. We reviewed his echocardiogram, shows dilated RV with moderately reduced function, septum mildly shifted leftwards. Reviewed compared to previous echo 2 years ago and degree of RV dysfunction is similar. He is unfortunately smoking again. We discussed that given his RV dysfunction it is likely that he will need transplant in the future given his age. He will need to be tobacco free for 6 months prior to any listing. We discussed adjunct agents to help with quitting, he would like to try on his own at this time.   Also discussed that he has been having bloody stools, usually just when he wipes. His hgb is stable, but we discussed that he would need screening colonoscopy   Labs (4/25): K 4.1, creatinine 1.02 Labs (5/25): hgb 10, INR 2.1  VAD Indication: Destination Therapy    VAD interrogation & Equipment Management: Speed: 6500 Flow: 5.6 Power: 5.7 w    PI: 4 Hct 32 10-15 PI events Alarms: none   Primary Controller:  Replace back up battery in 31 months Back up controller: Replace back up battery in 31 months   Annual Equipment Maintenance on UBC/PM was performed 10/03/2023   I reviewed the LVAD parameters from today and compared the results to the patient's prior recorded data.  LVAD interrogation was NEGATIVE for significant power changes, NEGATIVE for clinical alarms and STABLE for PI events/speed drops. No programming changes were made and pump is functioning within specified parameters.     Past Medical History:  Diagnosis Date   Asthma    CHF (congestive heart failure) (HCC)  a. 09/2016: EF 20-25% with cath showing normal cors   GERD (gastroesophageal reflux disease)    History of hiatal hernia    LVAD (left ventricular assist device) present (HCC)    OSA on CPAP 09/06/2018   Severe OSA with AHI 68/hr on CPAP at 12cm H2O   Past Surgical History:  Procedure Laterality Date   APPLICATION OF A-CELL OF CHEST/ABDOMEN  N/A 08/24/2019   Procedure: Application Of A-Cell Of Chest/Abdomen;  Surgeon: Fleeta Hanford Coy, MD;  Location: [facility] OR;  Service: Thoracic;  Laterality: N/A;   APPLICATION OF A-CELL OF CHEST/ABDOMEN N/A 09/05/2019   Procedure: Application Of A-Cell Of Chest/Abdomen;  Surgeon: Lowery Estefana RAMAN, DO;  Location: [facility] OR;  Service: Plastics;  Laterality: N/A;   APPLICATION OF A-CELL OF CHEST/ABDOMEN  09/03/2019   Procedure: Application Of A-Cell Of Chest/Abdomen;  Surgeon: Fleeta Hanford Coy, MD;  Location: [facility] OR;  Service: Thoracic;;   APPLICATION OF WOUND VAC N/A 08/22/2019   Procedure: Debridement, Irrigation and Packing of Abdominal Incision.;  Surgeon: Fleeta Hanford Coy, MD;  Location: [facility] OR;  Service: Thoracic;  Laterality: N/A;   APPLICATION OF WOUND VAC N/A 08/24/2019   Procedure: Irrigation and Debridement with WOUND VAC APPLICATION;  Surgeon: Fleeta Hanford Coy, MD;  Location: [facility] OR;  Service: Thoracic;  Laterality: N/A;   APPLICATION OF WOUND VAC N/A 08/30/2019   Procedure: APPLICATION OF WOUND VAC;  Surgeon: Fleeta Hanford Coy, MD;  Location: [facility] OR;  Service: Thoracic;  Laterality: N/A;   APPLICATION OF WOUND VAC N/A 09/03/2019   Procedure: WOUND VAC CHANGE;  Surgeon: Fleeta Hanford Coy, MD;  Location: [facility] OR;  Service: Thoracic;  Laterality: N/A;   BIOPSY  08/23/2018   Procedure: BIOPSY;  Surgeon: Wilhelmenia Aloha Raddle., MD;  Location: [facility] ENDOSCOPY;  Service: Gastroenterology;;   COLONOSCOPY N/A 08/23/2018   Procedure: COLONOSCOPY;  Surgeon: Wilhelmenia Aloha Raddle., MD;  Location: [facility] ENDOSCOPY;  Service: Gastroenterology;  Laterality: N/A;   ENTEROSCOPY N/A 08/23/2018   Procedure: ENTEROSCOPY;  Surgeon: Wilhelmenia Aloha Raddle., MD;  Location: [facility] ENDOSCOPY;  Service: Gastroenterology;  Laterality: N/A;   HEMOSTASIS CLIP PLACEMENT  08/23/2018   Procedure: HEMOSTASIS CLIP PLACEMENT;  Surgeon: Wilhelmenia Aloha Raddle., MD;  Location: [facility] ENDOSCOPY;  Service: Gastroenterology;;   IABP INSERTION N/A 09/05/2017    Procedure: IABP INSERTION;  Surgeon: Cherrie Toribio SAUNDERS, MD;  Location: [facility] INVASIVE CV LAB;  Service: Cardiovascular;  Laterality: N/A;   INSERTION OF IMPLANTABLE LEFT VENTRICULAR ASSIST DEVICE N/A 09/06/2017   Procedure: INSERTION OF IMPLANTABLE LEFT VENTRICULAR ASSIST DEVICE - HM3;  Surgeon: Fleeta Hanford Coy, MD;  Location: [facility] OR;  Service: Open Heart Surgery;  Laterality: N/A;  HM3   MUSCLE FLAP CLOSURE N/A 09/05/2019   Procedure: MUSCLE FLAP CLOSURE;  Surgeon: Lowery Estefana RAMAN, DO;  Location: [facility] OR;  Service: Plastics;  Laterality: N/A;   NASAL FRACTURE SURGERY  1987   POLYPECTOMY  08/23/2018   Procedure: POLYPECTOMY;  Surgeon: Mansouraty, Aloha Raddle., MD;  Location: [facility] ENDOSCOPY;  Service: Gastroenterology;;   RIGHT HEART CATH N/A 08/30/2017   Procedure: RIGHT HEART CATH;  Surgeon: Cherrie Toribio SAUNDERS, MD;  Location: [facility] INVASIVE CV LAB;  Service: Cardiovascular;  Laterality: N/A;   RIGHT/LEFT HEART CATH AND CORONARY ANGIOGRAPHY N/A 10/08/2016   Procedure: Right/Left Heart Cath and Coronary Angiography;  Surgeon: Claudene Pacific, MD;  Location: [facility] INVASIVE CV LAB;  Service: Cardiovascular;  Laterality: N/A;   STERNAL WOUND DEBRIDEMENT N/A 08/20/2019   Procedure: DEBRIDEMENT OF LVAD DRIVELINE TUNNEL;  Surgeon: Fleeta  Hanford Coy, MD;  Location: [facility] OR;  Service: Thoracic;  Laterality: N/A;   STERNAL WOUND DEBRIDEMENT N/A 09/05/2019   Procedure: DEBRIDEMENT AND CLOSURE OF ABDOMINAL WOUND;  Surgeon: Lowery Estefana RAMAN, DO;  Location: [facility] OR;  Service: Plastics;  Laterality: N/A;   STERNAL WOUND DEBRIDEMENT N/A 01/13/2023   Procedure: VAD TUNNEL WOUND DEBRIDEMENT;  Surgeon: Obadiah Coy, MD;  Location: [facility] OR;  Service: Thoracic;  Laterality: N/A;   SUBMUCOSAL TATTOO INJECTION  08/23/2018   Procedure: SUBMUCOSAL TATTOO INJECTION;  Surgeon: Wilhelmenia Aloha Raddle., MD;  Location: [facility] ENDOSCOPY;  Service: Gastroenterology;;   TEE WITHOUT CARDIOVERSION N/A 09/06/2017   Procedure: TRANSESOPHAGEAL  ECHOCARDIOGRAM (TEE);  Surgeon: Fleeta Hanford, Coy, MD;  Location: [facility] OR;  Service: Open Heart Surgery;  Laterality: N/A;   WOUND DEBRIDEMENT N/A 08/30/2019   Procedure: Debridement Abdominal Wound;  Surgeon: Fleeta Hanford Coy, MD;  Location: [facility] OR;  Service: Thoracic;  Laterality: N/A;     Current Outpatient Medications  Medication Sig Dispense Refill   cephALEXin  (KEFLEX ) 500 MG capsule Take 1 capsule (500 mg total) by mouth 3 (three) times daily. 90 capsule 11   doxazosin  (CARDURA ) 2 MG tablet Take 0.5 tablets (1 mg total) by mouth at bedtime. 45 tablet 3   gabapentin  (NEURONTIN ) 300 MG capsule TAKE ONE CAPSULE BY MOUTH THREE TIMES DAILY AT 9am, 3pm, AND 9pm 90 capsule 5   Multiple Vitamins-Minerals (ONE-A-DAY MENS HEALTH FORMULA PO) Take by mouth.     pantoprazole  (PROTONIX ) 40 MG tablet Take 1 tablet (40 mg total) by mouth 2 (two) times daily. 180 tablet 3   sacubitril -valsartan  (ENTRESTO ) 97-103 MG Take 1 tablet by mouth 2 (two) times daily. 60 tablet 6   sildenafil  (REVATIO ) 20 MG tablet Take 1 tablet (20 mg total) by mouth 3 (three) times daily. 90 tablet 11   spironolactone  (ALDACTONE ) 25 MG tablet Take 0.5 tablets (12.5 mg total) by mouth daily. 45 tablet 6   warfarin (COUMADIN ) 5 MG tablet TAKE 1 AND 1/2 TABLETS BY MOUTH DAILY AT 4pm OR as directed by HF CLINIC 90 tablet 5   No current facility-administered medications for this encounter.    Allergies:   Patient has no known allergies.   Social History:  The patient  reports that he has quit smoking. His smoking use included cigarettes. He has a 12.5 pack-year smoking history. He has never used smokeless tobacco. He reports that he does not currently use alcohol. He reports that he does not currently use drugs after having used the following drugs: Marijuana.   Family History:  The patient's family history includes Alcohol abuse in his cousin; Hypertension in his father.   ROS:  Please see the history of present illness.   All other  systems are personally reviewed and negative.   Vital Signs: Doppler 110 LDH 429, mildly up from baseline. No tea colored urine or power spkies  Exam: General: Well appearing this am. NAD.  Neck: Supple, JVP mildly elevated.  Cardiac:  Mechanical heart sounds with LVAD hum present.  Lungs:  CTAB, normal effort.  Abdomen:  NT, ND, no HSM. No bruits or masses. +BS  LVAD exit site: Well-healed and incorporated. Dressing dry and intact. No erythema or drainage. Stabilization device present and accurately applied. Driveline dressing changed daily per sterile technique. Extremities:  Warm and dry. No edema Neuro:  Alert & oriented x 3. Cranial nerves grossly intact. Moves all 4 extremities w/o difficulty. Affect pleasant    Recent Labs: 10/03/2023: ALT 12; Magnesium   1.7 12/05/2023: BUN 8; Creatinine, Ser 0.95; Hemoglobin 11.6; Platelets 180; Potassium 3.9; Sodium 139    Wt Readings from Last 3 Encounters:  10/26/23 102.1 kg (225 lb)  10/17/23 99.8 kg (220 lb)  10/03/23 102.2 kg (225 lb 3.2 oz)    ASSESSMENT AND PLAN:  1. 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 Treated with milrinone  and then weaned off.  Diuresed 40 pounds. VAD speed increased - Stable NYHA I despite RV failure.  - Not volume overloaded on exam.  - Continue 97/103mg  BID entresto  and spironolactone  12.5mg  daily, had not yet taken BP meds this morning.  - Reviewed echocardiogram, significant RV dysfunction but stable from prior. 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 Discussed need for screening colonoscopy and smoking cessation prior - Cotinine level at next visit - Continue sildenafil  20mg  TID    2. HM3 LVAD 09/06/2017.  - VAD interrogated personally, parameters stable.   - LDH 429, no concerning vad findings, will repeat - INR 2.2, goal 2.0-3.0.  Discussed warfarin doing with pharamcy - Off ASA with PUD.  - DL site looks good. Continue suppressive Keflex . Low threshold to add back cipro  if develops any drainage  3.  Essential HTN - MAP stable when taking meds.  - Continue medications as above  4. Recurrent DL infection with subxiphoid abscess - s/p debridement and pec flap in 4/21 - resolved. Completed doxy/cipro  - Continue Keflex  as above  5.  H/o GI bleed  - 08/2018 EGD showed gastritis and nonbleeding duodenal ulcers, colonoscopy with 5 polyps removed.  - Remains on PPI.No recent GI bleeding - CBC today.  - Discussed need for colonoscopy  6. Tobacco Abuse  - smoking again, discussed need for cessation  I spent a total of 45 minutes today: 1) reviewing the patient's medical records including previous charts, labs and recent notes from other providers; 2) examining the patient and counseling them on their medical issues/explaining the plan of care; 3) adjusting meds as needed and 4) ordering lab work or other needed tests.    Signed, Morene JINNY Brownie, MD  12/06/2023 9:40 AM  [facility] [address] [facility] [place] [address] [phone] (office) [phone] (fax)

## 2023-12-09 ENCOUNTER — Other Ambulatory Visit (HOSPITAL_COMMUNITY): Payer: Self-pay | Admitting: Unknown Physician Specialty

## 2023-12-09 DIAGNOSIS — Z95811 Presence of heart assist device: Secondary | ICD-10-CM

## 2023-12-09 DIAGNOSIS — D509 Iron deficiency anemia, unspecified: Secondary | ICD-10-CM

## 2023-12-09 DIAGNOSIS — Z7901 Long term (current) use of anticoagulants: Secondary | ICD-10-CM

## 2023-12-12 ENCOUNTER — Inpatient Hospital Stay (HOSPITAL_COMMUNITY)
Admission: RE | Admit: 2023-12-12 | Discharge: 2023-12-12 | Discharge status: Home / Self Care | Source: Ambulatory Visit | Attending: Cardiology

## 2023-12-12 DIAGNOSIS — Z4801 Encounter for change or removal of surgical wound dressing: Secondary | ICD-10-CM | POA: Insufficient documentation

## 2023-12-12 DIAGNOSIS — Z95811 Presence of heart assist device: Secondary | ICD-10-CM | POA: Insufficient documentation

## 2023-12-12 NOTE — Progress Notes (Signed)
 Pt presents for dressing change in VAD clinic today alone.   Pt has completed courses of Doxy and Cipro . Per ID pt will need to restart Cipro  if the drainage returns. Pt remains on Keflex  500mg  TID.  Exit Site Care: Existing VAD dressing removed and site care performed using sterile technique. Drive line exit site cleaned with Chlora prep applicators x 2, allowed to dry, and Sorbaview dressing with Silverlon patch applied. Exit site healed and incorporated, the velour is fully implanted at exit site. Site does not tunnel and is incorporated. No redness, tenderness, drainage, foul odor or rash noted. Drive line anchor re-applied x 2. 2 large tegaderms applied. Pt provided with 2 boxes of large tegaderms and 2 anchors for home use. Pt instructed that it is ok to shower but he needs to clean his showerhead with bleach.   Patient Instructions:  Return to clinic next Friday for labs and dressing change  Isaiah Knoll RN VAD Coordinator  Office: [phone]  24/7 Pager: [phone]

## 2023-12-16 ENCOUNTER — Other Ambulatory Visit (HOSPITAL_COMMUNITY)

## 2023-12-16 NOTE — Addendum Note (Signed)
 Encounter addended by: Zenaida Morene PARAS, MD on: 12/16/2023 5:30 AM  Actions taken: Level of Service modified

## 2023-12-19 ENCOUNTER — Ambulatory Visit (HOSPITAL_COMMUNITY)
Admission: RE | Admit: 2023-12-19 | Discharge: 2023-12-19 | Disposition: A | Discharge status: Home / Self Care | Source: Ambulatory Visit | Attending: Cardiology | Admitting: Cardiology

## 2023-12-19 ENCOUNTER — Ambulatory Visit (HOSPITAL_COMMUNITY): Payer: Self-pay | Admitting: Pharmacist

## 2023-12-19 DIAGNOSIS — D509 Iron deficiency anemia, unspecified: Secondary | ICD-10-CM | POA: Insufficient documentation

## 2023-12-19 DIAGNOSIS — Z7901 Long term (current) use of anticoagulants: Secondary | ICD-10-CM | POA: Insufficient documentation

## 2023-12-19 DIAGNOSIS — Z4801 Encounter for change or removal of surgical wound dressing: Secondary | ICD-10-CM | POA: Insufficient documentation

## 2023-12-19 DIAGNOSIS — Z95811 Presence of heart assist device: Secondary | ICD-10-CM

## 2023-12-19 LAB — PROTIME-INR
INR: 2.6 — ABNORMAL HIGH (ref 0.8–1.2)
Prothrombin Time: 28.7 s — ABNORMAL HIGH (ref 11.4–15.2)

## 2023-12-19 LAB — FOLATE: Folate: 12.1 ng/mL (ref >5.9)

## 2023-12-19 LAB — IRON AND TIBC
Iron: 71 ug/dL (ref 45–182)
Saturation Ratios: 27 % (ref 17.9–39.5)
TIBC: 259 ug/dL (ref 250–450)
UIBC: 188 ug/dL

## 2023-12-19 LAB — VITAMIN B12: Vitamin B-12: 384 pg/mL (ref 180–914)

## 2023-12-19 LAB — FERRITIN: Ferritin: 106 ng/mL (ref 24–336)

## 2023-12-19 LAB — LACTATE DEHYDROGENASE: LDH: 189 U/L (ref 98–192)

## 2023-12-19 NOTE — Progress Notes (Addendum)
 Pt presents for dressing change in VAD clinic today alone.   Pt has completed courses of Doxy and Cipro . Per ID pt will need to restart Cipro  if the drainage returns. Pt remains on Keflex  500mg  TID.  Pt having intermittent low battery alarms despite having fully charged batteries on. Loaner clips given and alarms resolved. Will order new clips for pt.   Pt going to [facility] till Thursday. Given closest VAD Center information and printed medication list.  Exit Site Care: Existing VAD dressing removed and site care performed using sterile technique. Drive line exit site cleaned with Chlora prep applicators x 2, allowed to dry, and Sorbaview dressing with Silverlon patch applied. Exit site healed and incorporated, the velour is fully implanted at exit site. Site does not tunnel and is incorporated. No redness, tenderness, drainage, foul odor or rash noted. Drive line anchor re-applied x 2. 2 large tegaderms applied. Pt instructed that it is ok to shower but he needs to clean his showerhead with bleach. Pt given 4 weekly kits and asked to bring one to each clinic visit for dressing change.   Patient Instructions:  Return to clinic next and dressing change  Schuyler Gladis PEAK, BSN VAD Coordinator 24/7 Pager [phone]

## 2023-12-23 ENCOUNTER — Other Ambulatory Visit (HOSPITAL_COMMUNITY)

## 2023-12-29 ENCOUNTER — Ambulatory Visit (HOSPITAL_COMMUNITY)
Admission: RE | Admit: 2023-12-29 | Discharge: 2023-12-29 | Disposition: A | Discharge status: Home / Self Care | Source: Ambulatory Visit | Attending: Cardiology | Admitting: Cardiology

## 2023-12-29 DIAGNOSIS — Z4801 Encounter for change or removal of surgical wound dressing: Secondary | ICD-10-CM | POA: Diagnosis present

## 2023-12-29 DIAGNOSIS — Z95811 Presence of heart assist device: Secondary | ICD-10-CM | POA: Insufficient documentation

## 2023-12-29 NOTE — Progress Notes (Signed)
 Pt presents for dressing change in VAD clinic today alone.   Pt has completed courses of Doxy and Cipro. Per ID pt will need to restart Cipro if the drainage returns. Pt remains on Keflex 500mg  TID.  New clips given to the pt today.   Exit Site Care: Existing VAD dressing removed and site care performed using sterile technique. Drive line exit site cleaned with Chlora prep applicators x 2, allowed to dry, and Sorbaview dressing with Silverlon patch applied. Exit site healed and incorporated, the velour is fully implanted at exit site. Site does not tunnel and is incorporated. No redness, tenderness, drainage, foul odor or rash noted. Drive line anchor re-applied x 2. 2 large tegaderms applied. Pt instructed that it is ok to shower but he needs to clean his showerhead with bleach. Pt given 4 weekly kits and asked to bring one to each clinic visit for dressing change.   Patient Instructions:  Return to clinic next week for dressing change and INR  Lauraine Ip RN, BSN VAD Coordinator 24/7 Pager [phone]

## 2023-12-30 ENCOUNTER — Other Ambulatory Visit (HOSPITAL_COMMUNITY): Payer: Self-pay | Admitting: Unknown Physician Specialty

## 2023-12-30 DIAGNOSIS — Z95811 Presence of heart assist device: Secondary | ICD-10-CM

## 2023-12-30 DIAGNOSIS — Z7901 Long term (current) use of anticoagulants: Secondary | ICD-10-CM

## 2024-01-06 ENCOUNTER — Other Ambulatory Visit (HOSPITAL_COMMUNITY)

## 2024-01-08 ENCOUNTER — Telehealth (HOSPITAL_COMMUNITY): Payer: Self-pay | Admitting: Unknown Physician Specialty

## 2024-01-08 DIAGNOSIS — R350 Frequency of micturition: Secondary | ICD-10-CM

## 2024-01-08 NOTE — Telephone Encounter (Signed)
 Received page from pt complaining of frequency to urinate. Pt denies blood in his urine or any pain when urinating. Pt has appt in VAD clinic for a driveline dresing change. I have added a u/a to this appt. Dr Cherrie aware of the above.  Lauraine Ip RN, BSN VAD Coordinator 24/7 Pager [phone]

## 2024-01-08 NOTE — Addendum Note (Signed)
 Addended by: ELZA DOMINO B on: 01/08/2024 07:12 PM   Modules accepted: Orders

## 2024-01-09 ENCOUNTER — Telehealth (INDEPENDENT_AMBULATORY_CARE_PROVIDER_SITE_OTHER): Payer: Self-pay | Admitting: Infectious Diseases

## 2024-01-09 ENCOUNTER — Ambulatory Visit (HOSPITAL_COMMUNITY)
Admission: RE | Admit: 2024-01-09 | Discharge: 2024-01-09 | Disposition: A | Discharge status: Home / Self Care | Source: Ambulatory Visit | Attending: Cardiology | Admitting: Cardiology

## 2024-01-09 ENCOUNTER — Ambulatory Visit (HOSPITAL_COMMUNITY): Payer: Self-pay | Admitting: Pharmacist

## 2024-01-09 DIAGNOSIS — T827XXA Infection and inflammatory reaction due to other cardiac and vascular devices, implants and grafts, initial encounter: Secondary | ICD-10-CM

## 2024-01-09 DIAGNOSIS — R350 Frequency of micturition: Secondary | ICD-10-CM | POA: Diagnosis not present

## 2024-01-09 DIAGNOSIS — I5042 Chronic combined systolic (congestive) and diastolic (congestive) heart failure: Secondary | ICD-10-CM

## 2024-01-09 DIAGNOSIS — I50812 Chronic right heart failure: Secondary | ICD-10-CM

## 2024-01-09 DIAGNOSIS — Z7901 Long term (current) use of anticoagulants: Secondary | ICD-10-CM | POA: Diagnosis not present

## 2024-01-09 DIAGNOSIS — Z95811 Presence of heart assist device: Secondary | ICD-10-CM | POA: Insufficient documentation

## 2024-01-09 DIAGNOSIS — B9689 Other specified bacterial agents as the cause of diseases classified elsewhere: Secondary | ICD-10-CM

## 2024-01-09 LAB — URINALYSIS, ROUTINE W REFLEX MICROSCOPIC
Bilirubin Urine: NEGATIVE
Glucose, UA: NEGATIVE mg/dL
Hgb urine dipstick: NEGATIVE
Ketones, ur: NEGATIVE mg/dL
Leukocytes,Ua: NEGATIVE
Nitrite: NEGATIVE
Protein, ur: NEGATIVE mg/dL
Specific Gravity, Urine: 1.023 (ref 1.005–1.030)
pH: 6 (ref 5.0–8.0)

## 2024-01-09 LAB — PROTIME-INR
INR: 2.2 — ABNORMAL HIGH (ref 0.8–1.2)
Prothrombin Time: 25.7 s — ABNORMAL HIGH (ref 11.4–15.2)

## 2024-01-09 MED ORDER — CIPROFLOXACIN HCL 750 MG PO TABS: 750.0000 mg | ORAL_TABLET | Freq: Two times a day (BID) | ORAL | 1 refills | Status: DC

## 2024-01-09 MED ORDER — CEPHALEXIN 500 MG PO CAPS: 500.0000 mg | ORAL_CAPSULE | Freq: Four times a day (QID) | ORAL | 11 refills | Status: AC

## 2024-01-09 NOTE — Telephone Encounter (Signed)
 Received notification that Kavan is in clinic with acute changes to abdominal wall and driveline with return of purulent drainage.   Med fill looks like he occasionally goes 6 weeks between fills of suppressive cephalexin  (last 2 fills noted this). Cultures are being obtained. He has a h/o MSSA bacteremia, on suppressive cephalexin  and Pseudomonas infection in the past for which we have not had him on suppression for the last 10 months at least.   Will increase cephalexin  to 500 mg QID and add Ciprofloxacin  750 mg BID and follow cultures / clinical improvement.   INR checks 2x weekly.   Will follow with VAD team this week for response to therapy, culture and further recommendations to guide treatment.    Corean Fireman, MSN, NP-C [facility] for Infectious Disease [facility]  [email or web address] Pager: [phone] Office: [phone] [facility] Main Line: [phone] *Secure Chat Communication Welcome  Total Encounter Time: 8 minutes

## 2024-01-09 NOTE — Progress Notes (Signed)
 Pt presents for dressing change in VAD clinic today alone.   Pt has completed courses of Doxy and Cipro . Per ID pt will need to restart Cipro  if the drainage returns. Pt remains on Keflex  500mg  TID.  Pt paged the VAD pager last night with c/o urinary frenquency. Urine sample today is clean.  Pt has purulent drainage today. ID contacted - see below for instructions.  Exit Site Care: Existing VAD dressing removed and site care performed using sterile technique. Drive line exit site cleaned with Chlora prep applicators x 2, and betadine  x 2, CULTURE OBTAINED, allowed to dry, and gauze dressing with Vashe moistened 2 x 2 applied. Exit site partially incorporated, the velour is fully implanted at exit site. Site does not tunnel. Moderate amount of brown drainage with foul odor. Slight redness, no tenderness, or rash noted. Drive line anchor re-applied x 2. 2 large tegaderms applied. Pt instructed that he MAY NOT shower at this time until further notice.     Patient Instructions:  Return to clinic Thursday for dressing change and INR Increase Cephalexin  to 4x times a day Start Ciprofloxacin  750 mg bid  Lauraine Ip RN, BSN VAD Coordinator 24/7 Pager [phone]

## 2024-01-10 LAB — URINE CULTURE: Culture: NO GROWTH

## 2024-01-11 LAB — AEROBIC CULTURE W GRAM STAIN (SUPERFICIAL SPECIMEN): Gram Stain: NONE SEEN

## 2024-01-12 ENCOUNTER — Ambulatory Visit (HOSPITAL_COMMUNITY): Payer: Self-pay | Admitting: Pharmacist

## 2024-01-12 ENCOUNTER — Ambulatory Visit (HOSPITAL_COMMUNITY)
Admission: RE | Admit: 2024-01-12 | Discharge: 2024-01-12 | Disposition: A | Discharge status: Home / Self Care | Source: Ambulatory Visit | Attending: Cardiology | Admitting: Cardiology

## 2024-01-12 DIAGNOSIS — Z95811 Presence of heart assist device: Secondary | ICD-10-CM | POA: Insufficient documentation

## 2024-01-12 DIAGNOSIS — Z4509 Encounter for adjustment and management of other cardiac device: Secondary | ICD-10-CM | POA: Diagnosis present

## 2024-01-12 DIAGNOSIS — Z7901 Long term (current) use of anticoagulants: Secondary | ICD-10-CM | POA: Insufficient documentation

## 2024-01-12 DIAGNOSIS — B9689 Other specified bacterial agents as the cause of diseases classified elsewhere: Secondary | ICD-10-CM | POA: Insufficient documentation

## 2024-01-12 LAB — PROTIME-INR
INR: 2.1 — ABNORMAL HIGH (ref 0.8–1.2)
Prothrombin Time: 24.9 s — ABNORMAL HIGH (ref 11.4–15.2)

## 2024-01-12 NOTE — Telephone Encounter (Signed)
 Pan sensitive serratia marcescens. Will drop cephalexin  back to suppressive regimen of TID and continue the ciprofloxacin  BID 750 mg for 2 weeks with re-evaluation in LVAD clinic    Jonathan Fireman, MSN, NP-C [facility] for Infectious Disease [facility]  [email or web address] Pager: [phone] Office: [phone] [facility] Main Line: [phone] *Secure Chat Communication Welcome  Total Encounter Time: 5 min

## 2024-01-12 NOTE — Progress Notes (Addendum)
 Pt presents for dressing change in VAD clinic today alone.   Pt had purulent drainage on monday. ID contacted, pt restarted Cipro  and increased Keflex  to qid. Culture is growing serratia marcescens. 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 The water supplied was from a Academic librarian. Pt instructed not to engage in this activity anymore.  Per ID, Corean Fireman pt instructed to decrease Keflex  to 3x a day and to continue Cipro  for 2 weeks (stop date of 01/24/24) and then reevaluate.   Exit Site Care: Existing VAD dressing removed and site care performed using sterile technique. Drive line exit site cleaned with Chlora prep applicators x 2, allowed to dry and gauze dressing applied. Exit site partially incorporated, the velour is fully implanted at exit site. Site does not tunnel. Small amount of brownish pink drainage. No redness, tenderness, or rash noted. Drive line anchor re-applied x 2. 2 large tegaderms applied. Pt instructed that he MAY NOT shower at this time until further notice.        Patient Instructions:  Decrease Keflex  to tid, continue Cipro  for 2 weeks. Potential stop date of 01/24/24 if driveline is better. Return to clinic Tuesday for dressing change and INR  Lauraine Ip RN, BSN VAD Coordinator 24/7 Pager [phone]

## 2024-01-12 NOTE — Addendum Note (Signed)
 Encounter addended by: Elza Lauraine NOVAK, RN on: 01/12/2024 4:53 PM  Actions taken: Clinical Note Signed

## 2024-01-13 ENCOUNTER — Other Ambulatory Visit (HOSPITAL_COMMUNITY): Payer: Self-pay | Admitting: Unknown Physician Specialty

## 2024-01-13 DIAGNOSIS — Z7901 Long term (current) use of anticoagulants: Secondary | ICD-10-CM

## 2024-01-13 DIAGNOSIS — Z95811 Presence of heart assist device: Secondary | ICD-10-CM

## 2024-01-17 ENCOUNTER — Ambulatory Visit (HOSPITAL_COMMUNITY)
Admission: RE | Admit: 2024-01-17 | Discharge: 2024-01-17 | Disposition: A | Discharge status: Home / Self Care | Source: Ambulatory Visit | Attending: Cardiology | Admitting: Cardiology

## 2024-01-17 ENCOUNTER — Ambulatory Visit (HOSPITAL_COMMUNITY): Payer: Self-pay | Admitting: Pharmacist

## 2024-01-17 DIAGNOSIS — Z95811 Presence of heart assist device: Secondary | ICD-10-CM | POA: Diagnosis not present

## 2024-01-17 DIAGNOSIS — Y838 Other surgical procedures as the cause of abnormal reaction of the patient, or of later complication, without mention of misadventure at the time of the procedure: Secondary | ICD-10-CM | POA: Insufficient documentation

## 2024-01-17 DIAGNOSIS — Z7901 Long term (current) use of anticoagulants: Secondary | ICD-10-CM | POA: Diagnosis not present

## 2024-01-17 DIAGNOSIS — T827XXA Infection and inflammatory reaction due to other cardiac and vascular devices, implants and grafts, initial encounter: Secondary | ICD-10-CM | POA: Diagnosis not present

## 2024-01-17 DIAGNOSIS — Z4801 Encounter for change or removal of surgical wound dressing: Secondary | ICD-10-CM | POA: Diagnosis present

## 2024-01-17 DIAGNOSIS — B9689 Other specified bacterial agents as the cause of diseases classified elsewhere: Secondary | ICD-10-CM | POA: Diagnosis not present

## 2024-01-17 DIAGNOSIS — T8149XA Infection following a procedure, other surgical site, initial encounter: Secondary | ICD-10-CM | POA: Diagnosis not present

## 2024-01-17 LAB — PROTIME-INR
INR: 2.1 — ABNORMAL HIGH (ref 0.8–1.2)
Prothrombin Time: 24.3 s — ABNORMAL HIGH (ref 11.4–15.2)

## 2024-01-17 NOTE — Progress Notes (Signed)
 Pt presents for dressing change in VAD clinic today alone.   Pt had purulent drainage on monday. ID contacted, pt restarted Cipro  and increased Keflex  to qid. Culture is growing serratia marcescens. 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 The water supplied was from a Academic librarian. Pt instructed not to engage in this activity anymore.  Per ID, Corean Fireman pt currently taking Keflex  to 3x a day and  Cipro  for 2 weeks (stop date of 01/24/24 for the cipro ) and then reevaluate.   Exit Site Care: Existing VAD dressing removed and site care performed using sterile technique. Drive line exit site cleaned with Chlora prep applicators x 2, allowed to dry and gauze dressing applied. Exit site partially incorporated, the velour is fully implanted at exit site. Site does not tunnel. Scant amount of brownish pink drainage. No redness, tenderness, or rash noted. Drive line anchor re-applied x 2. 2 large tegaderms applied. Pt instructed that he MAY NOT shower at this time until further notice. Pt given 4 weekly kits and 7 daily kits and 10 anchors for use.       Patient Instructions:  Continue Keflex  tid, and Cipro  until 9/9. Potential stop date of 01/24/24 if driveline is better. Return to clinic Friday for dressing change and INR  Lauraine Ip RN, BSN VAD Coordinator 24/7 Pager [phone]

## 2024-01-20 ENCOUNTER — Other Ambulatory Visit (HOSPITAL_COMMUNITY)

## 2024-01-23 ENCOUNTER — Ambulatory Visit (HOSPITAL_COMMUNITY): Payer: Self-pay | Admitting: Pharmacist

## 2024-01-23 ENCOUNTER — Ambulatory Visit (HOSPITAL_COMMUNITY)
Admission: RE | Admit: 2024-01-23 | Discharge: 2024-01-23 | Disposition: A | Discharge status: Home / Self Care | Source: Ambulatory Visit | Attending: Cardiology

## 2024-01-23 DIAGNOSIS — B9689 Other specified bacterial agents as the cause of diseases classified elsewhere: Secondary | ICD-10-CM | POA: Diagnosis not present

## 2024-01-23 DIAGNOSIS — Z95811 Presence of heart assist device: Secondary | ICD-10-CM | POA: Diagnosis not present

## 2024-01-23 DIAGNOSIS — T827XXA Infection and inflammatory reaction due to other cardiac and vascular devices, implants and grafts, initial encounter: Secondary | ICD-10-CM | POA: Insufficient documentation

## 2024-01-23 DIAGNOSIS — Z4509 Encounter for adjustment and management of other cardiac device: Secondary | ICD-10-CM | POA: Diagnosis present

## 2024-01-23 DIAGNOSIS — Z7901 Long term (current) use of anticoagulants: Secondary | ICD-10-CM

## 2024-01-23 LAB — PROTIME-INR
INR: 2.5 — ABNORMAL HIGH (ref 0.8–1.2)
Prothrombin Time: 28.1 s — ABNORMAL HIGH (ref 11.4–15.2)

## 2024-01-23 NOTE — Progress Notes (Addendum)
 Pt presents for dressing change/INR in VAD clinic today alone.   Pt had purulent drainage on monday. ID contacted, pt restarted Cipro  and increased Keflex  to qid. Culture is growing serratia marcescens. 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 The water supplied was from a Academic librarian. Pt instructed not to engage in this activity anymore.  Per ID, Corean Fireman pt currently taking Keflex  to 3x a day and  Cipro  for 2 weeks (stop date of 01/24/24 for the cipro ) and then reevaluate.   Pt brought MPU into clinic stating it was broken. Equipment fell off of his night stand and back end of power cable broke off. See image below. Pt given loaner MPU [id number] until new equipment arrives.    Exit Site Care: Existing VAD dressing removed and site care performed using sterile technique. Drive line exit site cleaned with Chlora prep applicators x 2, allowed to dry and gauze dressing applied. Exit site partially incorporated, the velour is fully implanted at exit site. Site does not tunnel. Scant amount of brownish pink drainage. No redness, tenderness, or rash noted. Drive line anchor re-applied x 2. 2 large tegaderms applied. Pt instructed that he MAY NOT shower at this time until further notice.  '    Patient Instructions:  Continue Keflex  tid, and Cipro  until 9/9. Potential stop date of 01/24/24 if driveline is better. Return to clinic  Monday for dressing change  Schuyler Lunger RN, BSN VAD Coordinator 24/7 Pager [phone]

## 2024-01-30 ENCOUNTER — Ambulatory Visit (HOSPITAL_COMMUNITY)
Admission: RE | Admit: 2024-01-30 | Discharge: 2024-01-30 | Disposition: A | Discharge status: Home / Self Care | Source: Ambulatory Visit | Attending: Internal Medicine | Admitting: Internal Medicine

## 2024-01-30 DIAGNOSIS — Z4801 Encounter for change or removal of surgical wound dressing: Secondary | ICD-10-CM | POA: Diagnosis not present

## 2024-01-30 DIAGNOSIS — Z95811 Presence of heart assist device: Secondary | ICD-10-CM | POA: Diagnosis not present

## 2024-01-30 MED ORDER — ONDANSETRON HCL 4 MG PO TABS: 4.0000 mg | ORAL_TABLET | Freq: Three times a day (TID) | ORAL | 0 refills | Status: AC | PRN

## 2024-01-30 NOTE — Progress Notes (Signed)
 Pt presents for dressing change in VAD clinic today alone.   Pt with recent Serratia drive line infection. Cipro  restarted 01/09/24 per ID. Pt having nausea and vomiting from antibiotic and has missed the last two doses. Prescription sent in for PRN Zofran . Discussed with Corean Fireman NP. Will stop Cipro  and monitor for now and monitor drive line.     Exit Site Care: Existing VAD dressing removed and site care performed using sterile technique. Drive line exit site cleaned with Chlora prep applicators x 2, allowed to dry, and Sorbaview dressing with Silverlon patch applied. Exit site healed and incorporated, the velour is fully implanted at exit site. Site does not tunnel and is incorporated. No redness, tenderness, drainage, foul odor or rash noted. Drive line anchor re-applied x 2. 2 large tegaderms applied.  Pt instructed that he MAY NOT shower at this time until further notice.      Patient Instructions:  STOP Cipro  Rx sent in for PRN Zofran  to [facility] Return to clinic  Monday for dressing change  Schuyler Lunger RN, BSN VAD Coordinator 24/7 Pager [phone]

## 2024-02-06 ENCOUNTER — Ambulatory Visit (HOSPITAL_COMMUNITY)

## 2024-02-08 ENCOUNTER — Other Ambulatory Visit (HOSPITAL_COMMUNITY): Payer: Self-pay | Admitting: Unknown Physician Specialty

## 2024-02-08 DIAGNOSIS — Z95811 Presence of heart assist device: Secondary | ICD-10-CM

## 2024-02-08 DIAGNOSIS — Z7901 Long term (current) use of anticoagulants: Secondary | ICD-10-CM

## 2024-02-09 ENCOUNTER — Ambulatory Visit (HOSPITAL_COMMUNITY): Payer: Self-pay | Admitting: Pharmacist

## 2024-02-09 ENCOUNTER — Ambulatory Visit (HOSPITAL_COMMUNITY)
Admission: RE | Admit: 2024-02-09 | Discharge: 2024-02-09 | Disposition: A | Discharge status: Home / Self Care | Source: Ambulatory Visit | Attending: Internal Medicine | Admitting: Internal Medicine

## 2024-02-09 VITALS — BP 124/109 | HR 68 | Ht 74.0 in | Wt 229.2 lb

## 2024-02-09 DIAGNOSIS — Z7901 Long term (current) use of anticoagulants: Secondary | ICD-10-CM | POA: Diagnosis not present

## 2024-02-09 DIAGNOSIS — Z4509 Encounter for adjustment and management of other cardiac device: Secondary | ICD-10-CM | POA: Insufficient documentation

## 2024-02-09 DIAGNOSIS — I10 Essential (primary) hypertension: Secondary | ICD-10-CM

## 2024-02-09 DIAGNOSIS — Z95811 Presence of heart assist device: Secondary | ICD-10-CM | POA: Insufficient documentation

## 2024-02-09 DIAGNOSIS — T827XXA Infection and inflammatory reaction due to other cardiac and vascular devices, implants and grafts, initial encounter: Secondary | ICD-10-CM | POA: Diagnosis not present

## 2024-02-09 DIAGNOSIS — I5022 Chronic systolic (congestive) heart failure: Secondary | ICD-10-CM

## 2024-02-09 DIAGNOSIS — Z4801 Encounter for change or removal of surgical wound dressing: Secondary | ICD-10-CM | POA: Insufficient documentation

## 2024-02-09 DIAGNOSIS — Z720 Tobacco use: Secondary | ICD-10-CM

## 2024-02-09 LAB — CBC
HCT: 33 % — ABNORMAL LOW (ref 39.0–52.0)
Hemoglobin: 10.7 g/dL — ABNORMAL LOW (ref 13.0–17.0)
MCH: 28.2 pg (ref 26.0–34.0)
MCHC: 32.4 g/dL (ref 30.0–36.0)
MCV: 87.1 fL (ref 80.0–100.0)
Platelets: 194 K/uL (ref 150–400)
RBC: 3.79 MIL/uL — ABNORMAL LOW (ref 4.22–5.81)
RDW: 12.7 % (ref 11.5–15.5)
WBC: 5.6 K/uL (ref 4.0–10.5)
nRBC: 0 % (ref 0.0–0.2)

## 2024-02-09 LAB — PROTIME-INR
INR: 2.6 — ABNORMAL HIGH (ref 0.8–1.2)
Prothrombin Time: 29.3 s — ABNORMAL HIGH (ref 11.4–15.2)

## 2024-02-09 LAB — COMPREHENSIVE METABOLIC PANEL WITH GFR
ALT: 13 U/L (ref 0–44)
AST: 20 U/L (ref 15–41)
Albumin: 3.5 g/dL (ref 3.5–5.0)
Alkaline Phosphatase: 56 U/L (ref 38–126)
Anion gap: 10 (ref 5–15)
BUN: 10 mg/dL (ref 6–20)
CO2: 21 mmol/L — ABNORMAL LOW (ref 22–32)
Calcium: 8.9 mg/dL (ref 8.9–10.3)
Chloride: 107 mmol/L (ref 98–111)
Creatinine, Ser: 1.07 mg/dL (ref 0.61–1.24)
GFR, Estimated: >60 mL/min (ref >60)
Glucose, Bld: 99 mg/dL (ref 70–99)
Potassium: 4 mmol/L (ref 3.5–5.1)
Sodium: 138 mmol/L (ref 135–145)
Total Bilirubin: 0.8 mg/dL (ref 0.0–1.2)
Total Protein: 6.8 g/dL (ref 6.5–8.1)

## 2024-02-09 LAB — LACTATE DEHYDROGENASE: LDH: 176 U/L (ref 98–192)

## 2024-02-09 LAB — PREALBUMIN: Prealbumin: 18 mg/dL (ref 18–38)

## 2024-02-09 NOTE — Progress Notes (Signed)
 Patient presents for 2 month f/u with 5.5 year intermacs and annual main in VAD clinic today alone. Denies any issues with VAD or equipment.   Patient reports he has been feeling good, no limitations with any activity.   Pt confirms he stopped Cipro  and will remain on Keflex . Dressing change performed as described below. Patient may start taking showers again. Asked him to call us  if any changes in drive line site exit.   BP elevated at visit today - patient says he has not taken am medications. Dr. Bensimhon asked that he take them prior to his next dressing change and we will re-check BP at that time.  Pt did not bring back up equipment bag today. Asked him to bring to next visit so we can check his back up controller and charge battery. Pt verbalized understanding of same.   Vital Signs: Ht: 6'2 Doppler: 124 Automatic BP: 124/109 (116) HR: 68 SPO2: 99% RA   Weight: 229.4 lbs w/ eqt Last wt: 222 lbs w/ equip   VAD Indication: Destination Therapy    VAD interrogation & Equipment Management: Speed: 6500 Flow: 5.7 Power: 5.8w    PI: 3.4 Hct: 32  Alarms: multiple low voltage advisories  Events: 5 - 20 daily  Primary Controller:  Replace back up battery in 29 months Back up controller: Pt did not bring to clinic  Annual Equipment Maintenance on UBC/PM was performed 10/03/23   I reviewed the LVAD parameters from today and compared the results to the patient's prior recorded data.  LVAD interrogation was NEGATIVE for significant power changes, NEGATIVE for clinical alarms and STABLE for PI events/speed drops. No programming changes were made and pump is functioning within specified parameters.   Exit Site Care: Existing VAD dressing removed and site care performed using sterile technique. Drive line exit site cleaned with Chlora prep applicators x 2, allowed to dry, and Sorbaview dressing with Silverlon patch applied. Exit site healed and incorporated, the velour is fully  implanted at exit site. Site does not tunnel and is incorporated. No redness, tenderness, drainage, foul odor or rash noted. Drive line anchor re-applied x 2. 2 large tegaderms applied. Pt may begin showers at home. Pt has sufficient kits at home.  Device: none  BP & Labs:  Doppler 124 - reflecting modified systolic BP   Hgb pending - No S/S of bleeding. Specifically denies melena/BRBPR or nosebleeds.    LDH pending - established baseline of 200-300. Denies tea-colored urine. No power elevations noted on interrogation.    Pt did not bring backup system controller to this visit. Asked him to bring to next dressing change visit so we can charge back up battery.    5.5 year Intermacs follow up completed including:  Quality of Life, KCCQ-12, and Neurocognitive trail making.   Pt completed 1550 feet during 6 minute walk.  [place] Cardiomyopathy Questionnaire     02/09/2024    9:41 AM 10/03/2023   10:54 AM 05/04/2023   11:46 AM  KCCQ-12  1 a. Ability to shower/bathe Not at all limited Slightly limited Not at all limited  1 b. Ability to walk 1 block Not at all limited Not at all limited Not at all limited  1 c. Ability to hurry/jog Not at all limited Slightly limited Slightly limited  2. Edema feet/ankles/legs Never over the past 2 weeks Never over the past 2 weeks Never over the past 2 weeks  3. Limited by fatigue Never over the past 2 weeks Less  than once a week Never over the past 2 weeks  4. Limited by dyspnea Never over the past 2 weeks Less than once a week Never over the past 2 weeks  5. Sitting up / on 3+ pillows Never over the past 2 weeks Never over the past 2 weeks Never over the past 2 weeks  6. Limited enjoyment of life Not limited at all Slightly limited Slightly limited  7. Rest of life w/ symptoms Completely satisfied Mostly satisfied Mostly satisfied  8 a. Participation in hobbies Slightly limited Slightly limited Slightly limited  8 b. Participation in chores  Slightly limited Slightly limited Slightly limited  8 c. Visiting family/friends Did not limit at all Did not limit at all N/A, did not do for other reasons       Patient Goals: Stop smoking Be listed for transplant      Patient Instructions:  No change in medications. Next dressing change visit, take your medications and we will re-check your blood pressure. Please bring you VAD back up bag to next visit so we can check your back up controller and charge battery.  Return to VAD Clinic for INR and dressing change in 10 days - see below. Please call us  if any questions or concerns prior to your next visit.   Geofm Christmas RN,BSN VAD Coordinator  Office: [phone]  24/7 Pager: [phone]

## 2024-02-09 NOTE — Progress Notes (Signed)
 VAD Clinic Note    ID:  Jathen, Sudano [birth year], MRN [id number]  Location: Home  Provider location: [place] [facility] Type of Visit: Established patient  PCP: [name]  Cardiologist:  Toribio Fuel, MD Primary HF: Dr Fuel   Chief Complaint: Heart Failure/LVAD  History of Present Illness:  Jonathan Wood is a 54 y.o. 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  GI issues: - 4/20 EGD with gastritis and nonbleeding duodenal ulcers, colonoscopy with 5 polyps removed. - 10/21 ETOH pancreatitis. Gallbladder ok. Resolved with medical management   Infection issues: - Admitted 12/20 for DL infection with overlying cellulitis and volume overload.  - Admitted 4/21 for DL infection with large subxiphoid abscess. Underwent several debridements and eventually a pec muscle flap. Wound cx + MSSA. Initially on ancef  and rifabutin  but expanded to cefipime then changed to IV ancef  and po rifabutin .  - Readmitted 6/21 with superficial abscess and tachypnea. Superficial abscess I&D'd and packed. CT scan done and showed possible deeper fluid collection. Reviewed with TCTS who felt it was muscle flap. Sent to IR for possible drainage but u/s confirmed it was the muscle flap and no drainable fluid collection. Wound cx negative. ID saw and recommended continuing Keflex .  - Admitted 8/24 for DL infection He was admitted  for debridement and observation after. Wound packed with VASHE wet/dry dressing. Completed 3 days of IV cefepime  while admitted. Discharged on po cipro  750 bid = continue Keflex  for previous MSSA  RV failure: - Admitted 8/21 with lactic acidosis due to cardiogenic shock. Started on 0.25 of milrinone  and NE. Ramp echo on 8/13 with severe RV dysfunction. Speed increased 5600-> 6300. Tolerated ok. Repeat ramp echo on 8/20 speed increased to 6500. Diuresed  over 40 pounds.   Here for regularly scheduled follow up. Feeling good. Denies SOB or edema. Denies orthopnea or PND. No fevers, chills or problems with driveline. No bleeding, melena or neuro symptoms. No VAD alarms. Taking all meds as prescribed. Still smoking a few cigarettes per day. SABRA     LVAD Documentation    02/09/2024  Device Info  LVAD Type: Heartmate III  Date of Implant: 09/06/2017  Therapy Type: Destination Therapy      02/09/2024  Vitals  Heart Rate: 68 BPM  Automatic BP: 124/109  Doppler MAP: 116 mmHg  SpO2: 99 %    Last 3 Weights Weight Weight  02/09/2024 103.964 kg 229 lb 3.2 oz  10/26/2023 102.059 kg 225 lb  10/17/2023 99.791 kg 220 lb       02/09/2024  LVAD Paramaters  Speed: 6500 RPM  Flow: 6 LPM  PI: 3  Power: 6 Watts  Hematocrit: 32 %  Alarms: few low voltage advisories  Events: 5 - 20  Last Speed Change Date: 01/04/2020  Last Ramp Echo Date: 01/04/2020  Last Right Heart Cath Date: 08/30/2017  Bleeding History: No  Type of Dressing: Weekly  Annual Maintenance Date: 10/03/2023    Labs    Units 02/24/24 0936 02/16/24 1019 02/09/24 0933 01/09/24 0928 12/19/23 1002 12/05/23 0955 10/17/23 0916 10/03/23 0905  INR  2.0* 2.9* 2.6*   < > 2.6* 2.2*   < > 2.1*  LDH U/L  --   --  176  --  189 429*  --  158  HGB g/dL  --   --  89.2*  --   --  11.6*  --  10.0*  CREATININE mg/dL  --   --  8.92  --   --  0.95  --  1.04   < > = values in this interval not displayed.        Past Medical History:  Diagnosis Date   Asthma    CHF (congestive heart failure) (HCC)    a. 09/2016: EF 20-25% with cath showing normal cors   GERD (gastroesophageal reflux disease)    History of hiatal hernia    LVAD (left ventricular assist device) present (HCC)    OSA on CPAP 09/06/2018   Severe OSA with AHI 68/hr on CPAP at 12cm H2O   Past Surgical History:  Procedure Laterality Date   APPLICATION OF A-CELL OF CHEST/ABDOMEN N/A 08/24/2019   Procedure: Application Of A-Cell Of  Chest/Abdomen;  Surgeon: Fleeta Hanford Coy, MD;  Location: [facility] OR;  Service: Thoracic;  Laterality: N/A;   APPLICATION OF A-CELL OF CHEST/ABDOMEN N/A 09/05/2019   Procedure: Application Of A-Cell Of Chest/Abdomen;  Surgeon: Lowery Estefana RAMAN, DO;  Location: [facility] OR;  Service: Plastics;  Laterality: N/A;   APPLICATION OF A-CELL OF CHEST/ABDOMEN  09/03/2019   Procedure: Application Of A-Cell Of Chest/Abdomen;  Surgeon: Fleeta Hanford Coy, MD;  Location: [facility] OR;  Service: Thoracic;;   APPLICATION OF WOUND VAC N/A 08/22/2019   Procedure: Debridement, Irrigation and Packing of Abdominal Incision.;  Surgeon: Fleeta Hanford Coy, MD;  Location: [facility] OR;  Service: Thoracic;  Laterality: N/A;   APPLICATION OF WOUND VAC N/A 08/24/2019   Procedure: Irrigation and Debridement with WOUND VAC APPLICATION;  Surgeon: Fleeta Hanford Coy, MD;  Location: [facility] OR;  Service: Thoracic;  Laterality: N/A;   APPLICATION OF WOUND VAC N/A 08/30/2019   Procedure: APPLICATION OF WOUND VAC;  Surgeon: Fleeta Hanford Coy, MD;  Location: [facility] OR;  Service: Thoracic;  Laterality: N/A;   APPLICATION OF WOUND VAC N/A 09/03/2019   Procedure: WOUND VAC CHANGE;  Surgeon: Fleeta Hanford Coy, MD;  Location: [facility] OR;  Service: Thoracic;  Laterality: N/A;   BIOPSY  08/23/2018   Procedure: BIOPSY;  Surgeon: Wilhelmenia Aloha Raddle., MD;  Location: [facility] ENDOSCOPY;  Service: Gastroenterology;;   COLONOSCOPY N/A 08/23/2018   Procedure: COLONOSCOPY;  Surgeon: Wilhelmenia Aloha Raddle., MD;  Location: [facility] ENDOSCOPY;  Service: Gastroenterology;  Laterality: N/A;   ENTEROSCOPY N/A 08/23/2018   Procedure: ENTEROSCOPY;  Surgeon: Wilhelmenia Aloha Raddle., MD;  Location: [facility] ENDOSCOPY;  Service: Gastroenterology;  Laterality: N/A;   HEMOSTASIS CLIP PLACEMENT  08/23/2018   Procedure: HEMOSTASIS CLIP PLACEMENT;  Surgeon: Wilhelmenia Aloha Raddle., MD;  Location: [facility] ENDOSCOPY;  Service: Gastroenterology;;   IABP INSERTION N/A 09/05/2017   Procedure: IABP INSERTION;  Surgeon: Cherrie Toribio SAUNDERS,  MD;  Location: [facility] INVASIVE CV LAB;  Service: Cardiovascular;  Laterality: N/A;   INSERTION OF IMPLANTABLE LEFT VENTRICULAR ASSIST DEVICE N/A 09/06/2017   Procedure: INSERTION OF IMPLANTABLE LEFT VENTRICULAR ASSIST DEVICE - HM3;  Surgeon: Fleeta Hanford Coy, MD;  Location: [facility] OR;  Service: Open Heart Surgery;  Laterality: N/A;  HM3   MUSCLE FLAP CLOSURE N/A 09/05/2019   Procedure: MUSCLE FLAP CLOSURE;  Surgeon: Lowery Estefana RAMAN, DO;  Location: [facility] OR;  Service: Plastics;  Laterality: N/A;   NASAL FRACTURE SURGERY  1987   POLYPECTOMY  08/23/2018   Procedure: POLYPECTOMY;  Surgeon: Mansouraty, Aloha Raddle., MD;  Location: [facility] ENDOSCOPY;  Service: Gastroenterology;;   RIGHT HEART CATH N/A 08/30/2017   Procedure: RIGHT HEART CATH;  Surgeon: Cherrie Toribio SAUNDERS, MD;  Location:  [facility] INVASIVE CV LAB;  Service: Cardiovascular;  Laterality: N/A;   RIGHT/LEFT HEART CATH AND CORONARY ANGIOGRAPHY N/A 10/08/2016   Procedure: Right/Left Heart Cath and Coronary Angiography;  Surgeon: Claudene Pacific, MD;  Location: [facility] INVASIVE CV LAB;  Service: Cardiovascular;  Laterality: N/A;   STERNAL WOUND DEBRIDEMENT N/A 08/20/2019   Procedure: DEBRIDEMENT OF LVAD DRIVELINE TUNNEL;  Surgeon: Fleeta Hanford Coy, MD;  Location: [facility] OR;  Service: Thoracic;  Laterality: N/A;   STERNAL WOUND DEBRIDEMENT N/A 09/05/2019   Procedure: DEBRIDEMENT AND CLOSURE OF ABDOMINAL WOUND;  Surgeon: Lowery Estefana RAMAN, DO;  Location: [facility] OR;  Service: Plastics;  Laterality: N/A;   STERNAL WOUND DEBRIDEMENT N/A 01/13/2023   Procedure: VAD TUNNEL WOUND DEBRIDEMENT;  Surgeon: Obadiah Coy, MD;  Location: [facility] OR;  Service: Thoracic;  Laterality: N/A;   SUBMUCOSAL TATTOO INJECTION  08/23/2018   Procedure: SUBMUCOSAL TATTOO INJECTION;  Surgeon: Wilhelmenia Aloha Raddle., MD;  Location: [facility] ENDOSCOPY;  Service: Gastroenterology;;   TEE WITHOUT CARDIOVERSION N/A 09/06/2017   Procedure: TRANSESOPHAGEAL ECHOCARDIOGRAM (TEE);  Surgeon: Fleeta Hanford, Coy, MD;  Location: [facility] OR;   Service: Open Heart Surgery;  Laterality: N/A;   WOUND DEBRIDEMENT N/A 08/30/2019   Procedure: Debridement Abdominal Wound;  Surgeon: Fleeta Hanford Coy, MD;  Location: [facility] OR;  Service: Thoracic;  Laterality: N/A;     Current Outpatient Medications  Medication Sig Dispense Refill   cephALEXin  (KEFLEX ) 500 MG capsule Take 1 capsule (500 mg total) by mouth 4 (four) times daily. 120 capsule 11   gabapentin  (NEURONTIN ) 300 MG capsule TAKE ONE CAPSULE BY MOUTH THREE TIMES DAILY AT 9am, 3pm, AND 9pm 90 capsule 5   Multiple Vitamins-Minerals (ONE-A-DAY MENS HEALTH FORMULA PO) Take by mouth.     pantoprazole  (PROTONIX ) 40 MG tablet Take 1 tablet (40 mg total) by mouth 2 (two) times daily. 180 tablet 3   sacubitril -valsartan  (ENTRESTO ) 97-103 MG Take 1 tablet by mouth 2 (two) times daily. 60 tablet 6   sildenafil  (REVATIO ) 20 MG tablet Take 1 tablet (20 mg total) by mouth 3 (three) times daily. 90 tablet 11   spironolactone  (ALDACTONE ) 25 MG tablet Take 0.5 tablets (12.5 mg total) by mouth daily. 45 tablet 6   warfarin (COUMADIN ) 5 MG tablet TAKE 1 AND 1/2 TABLETS BY MOUTH DAILY AT 4pm OR as directed by HF CLINIC 90 tablet 5   ciprofloxacin  (CIPRO ) 750 MG tablet Take 1 tablet (750 mg total) by mouth 2 (two) times daily. 60 tablet 1   doxazosin  (CARDURA ) 2 MG tablet Take 1 tablet (2 mg total) by mouth at bedtime. 90 tablet 3   ondansetron  (ZOFRAN ) 4 MG tablet Take 1 tablet (4 mg total) by mouth every 8 (eight) hours as needed for nausea or vomiting. (Patient not taking: Reported on 02/09/2024) 20 tablet 0   No current facility-administered medications for this encounter.    Allergies:   Patient has no known allergies.   Social History:  The patient  reports that he has quit smoking. His smoking use included cigarettes. He has a 12.5 pack-year smoking history. He has never used smokeless tobacco. He reports that he does not currently use alcohol. He reports that he does not currently use drugs after  having used the following drugs: Marijuana.   Family History:  The patient's family history includes Alcohol abuse in his cousin; Hypertension in his father.   ROS:  Please see the history of present illness.   All other systems are personally reviewed and negative.  Exam: General:  NAD.  HEENT: normal  Neck: supple. JVP not elevated.  Carotids 2+ bilat; no bruits. No lymphadenopathy or thryomegaly appreciated. Cor: LVAD hum.  Lungs: Clear. Abdomen: soft, nontender, non-distended. No hepatosplenomegaly. No bruits or masses. Good bowel sounds. Driveline site clean. Anchor in place.  Extremities: no cyanosis, clubbing, rash. Warm no edema  Neuro: alert & oriented x 3. No focal deficits. Moves all 4 without problem    Recent Labs: 10/03/2023: Magnesium  1.7 02/09/2024: ALT 13; BUN 10; Creatinine, Ser 1.07; Hemoglobin 10.7; Platelets 194; Potassium 4.0; Sodium 138    Wt Readings from Last 3 Encounters:  02/09/24 104 kg (229 lb 3.2 oz)  10/26/23 102.1 kg (225 lb)  10/17/23 99.8 kg (220 lb)    ASSESSMENT AND PLAN:  1. 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 Treated with milrinone  and then weaned off.  Diuresed 40 pounds. VAD speed increased - Ramp echo 7/25 LVEF 40-45% VAD placement stable Mod RV dysfunction. No AI. Speed not changed - Stable NYHA I. Volume ok - Continue 97/103mg  BID entresto  and spironolactone  12.5mg  daily, had not yet taken BP meds this morning.  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 Discussed need for screening colonoscopy and smoking cessation prior - Continue sildenafil  20mg  TID    2. HM3 LVAD 09/06/2017.  - VAD interrogated personally. Parameters stable. - -Ramp echo 7/25 LVEF 40-45% VAD placement stable Mod RV dysfunction. No AI.  Speed not changed  - LDH 175 - INR 2.6 goal 2.0-3.0.  Discussed warfarin dosing with PharmD personally. - Off ASA with PUD.  - DL site looks good Continue suppressive Keflex . Low threshold to add back cipro  if develops any drainage  3.  Essential HTN - MAP stable after he takes meds  - Continue medications as above  4. Recurrent DL infection with subxiphoid abscess - s/p debridement and pec flap in 4/21 - resolved. Completed doxy/cipro  - Continue Keflex  as above - no change  5.  H/o GI bleed  - 08/2018 EGD showed gastritis and nonbleeding duodenal ulcers, colonoscopy with 5 polyps removed.  - Remains on PPI.No recent GI bleeding  6. Tobacco Abuse  - smoking again, discussed need for cessation  I spent a total of 42 minutes today: 1) reviewing the patient's medical records including previous charts, labs and recent notes from other providers; 2) examining the patient and counseling them on their medical issues/explaining the plan of care; 3) adjusting meds as needed and 4) ordering lab work or other needed tests.    Signed, Toribio Fuel, MD  02/26/2024 10:20 PM  [facility] [address] [facility] [place] [address] [phone] (office) [phone] (fax)

## 2024-02-09 NOTE — Patient Instructions (Addendum)
 No change in medications. Next dressing change visit, take your medications and we will re-check your blood pressure. Please bring you VAD back up bag to next visit so we can check your back up controller and charge battery.  Return to VAD Clinic for INR and dressing change in 10 days - see below. Please call us  if any questions or concerns prior to your next visit.

## 2024-02-10 ENCOUNTER — Other Ambulatory Visit (HOSPITAL_COMMUNITY): Payer: Self-pay | Admitting: Unknown Physician Specialty

## 2024-02-10 DIAGNOSIS — Z7901 Long term (current) use of anticoagulants: Secondary | ICD-10-CM

## 2024-02-10 DIAGNOSIS — Z95811 Presence of heart assist device: Secondary | ICD-10-CM

## 2024-02-16 ENCOUNTER — Ambulatory Visit (HOSPITAL_COMMUNITY): Payer: Self-pay | Admitting: Pharmacist

## 2024-02-16 ENCOUNTER — Ambulatory Visit (HOSPITAL_COMMUNITY)
Admission: RE | Admit: 2024-02-16 | Discharge: 2024-02-16 | Disposition: A | Discharge status: Home / Self Care | Source: Ambulatory Visit | Attending: Internal Medicine | Admitting: Internal Medicine

## 2024-02-16 DIAGNOSIS — Z95811 Presence of heart assist device: Secondary | ICD-10-CM | POA: Insufficient documentation

## 2024-02-16 DIAGNOSIS — Z7901 Long term (current) use of anticoagulants: Secondary | ICD-10-CM | POA: Insufficient documentation

## 2024-02-16 LAB — PROTIME-INR
INR: 2.9 — ABNORMAL HIGH (ref 0.8–1.2)
Prothrombin Time: 31.8 s — ABNORMAL HIGH (ref 11.4–15.2)

## 2024-02-16 NOTE — Addendum Note (Signed)
 Encounter addended by: Gladis Schuyler BROCKS, RN on: 02/16/2024 10:31 AM  Actions taken: Child order released for a procedure order

## 2024-02-16 NOTE — Progress Notes (Signed)
 Pt presents for dressing change in VAD clinic today alone. Reports no issues with VAD equipment or drive line.   Pt advised to take blood pressure meds prior to today's appointment for BP check. Pt forot to take meds this morning. Will check next week.    Exit Site Care: Existing VAD dressing removed and site care performed using sterile technique. Drive line exit site cleaned with Chlora prep applicators x 2, allowed to dry, and Sorbaview dressing with Silverlon patch applied. Exit site healed and incorporated, the velour is fully implanted at exit site. Site does not tunnel and is incorporated. No redness, tenderness, drainage, foul odor or rash noted. Drive line anchor re-applied x 2. 2 large tegaderms applied.     Patient Instructions:  Return to clinic  Friday for dressing change  Schuyler Lunger RN, BSN VAD Coordinator 24/7 Pager [phone]

## 2024-02-17 ENCOUNTER — Other Ambulatory Visit (HOSPITAL_COMMUNITY): Payer: Self-pay

## 2024-02-17 ENCOUNTER — Other Ambulatory Visit (HOSPITAL_COMMUNITY)

## 2024-02-17 DIAGNOSIS — Z95811 Presence of heart assist device: Secondary | ICD-10-CM

## 2024-02-17 DIAGNOSIS — Z7901 Long term (current) use of anticoagulants: Secondary | ICD-10-CM

## 2024-02-24 ENCOUNTER — Ambulatory Visit (HOSPITAL_COMMUNITY)
Admission: RE | Admit: 2024-02-24 | Discharge: 2024-02-24 | Disposition: A | Discharge status: Home / Self Care | Source: Ambulatory Visit | Attending: Cardiology | Admitting: Cardiology

## 2024-02-24 ENCOUNTER — Ambulatory Visit (HOSPITAL_COMMUNITY): Payer: Self-pay | Admitting: Pharmacist

## 2024-02-24 DIAGNOSIS — Z95811 Presence of heart assist device: Secondary | ICD-10-CM | POA: Diagnosis not present

## 2024-02-24 DIAGNOSIS — Z4509 Encounter for adjustment and management of other cardiac device: Secondary | ICD-10-CM | POA: Insufficient documentation

## 2024-02-24 DIAGNOSIS — Z7901 Long term (current) use of anticoagulants: Secondary | ICD-10-CM | POA: Insufficient documentation

## 2024-02-24 DIAGNOSIS — I10 Essential (primary) hypertension: Secondary | ICD-10-CM

## 2024-02-24 DIAGNOSIS — Z79899 Other long term (current) drug therapy: Secondary | ICD-10-CM | POA: Insufficient documentation

## 2024-02-24 DIAGNOSIS — I5022 Chronic systolic (congestive) heart failure: Secondary | ICD-10-CM

## 2024-02-24 LAB — PROTIME-INR
INR: 2 — ABNORMAL HIGH (ref 0.8–1.2)
Prothrombin Time: 24.1 s — ABNORMAL HIGH (ref 11.4–15.2)

## 2024-02-24 MED ORDER — DOXAZOSIN MESYLATE 2 MG PO TABS: 2.0000 mg | ORAL_TABLET | Freq: Every day | ORAL | 3 refills | Status: AC

## 2024-02-24 NOTE — Progress Notes (Signed)
 Pt presents for dressing change in VAD clinic today alone. Reports no issues with VAD equipment or drive line.   Pt advised to take blood pressure meds prior to today's appointment for BP check.   Auto BP: 116/92 (101) d/w with Dr Bensimhon will increase Cardura  to 2 mg.  Exit Site Care: Existing VAD dressing removed and site care performed using sterile technique. Drive line exit site cleaned with Chlora prep applicators x 2, allowed to dry, and Sorbaview dressing with Silverlon patch applied. Exit site healed and incorporated, the velour is fully implanted at exit site. Site does not tunnel and is incorporated. No redness, tenderness, drainage, foul odor or rash noted. Drive line anchor re-applied x 2. 2 large tegaderms applied.     Patient Instructions:  Increase Cardura  to 2 mg every night Return to clinic in 10 days for dressing change  Lauraine Ip RN, BSN VAD Coordinator 24/7 Pager [phone]

## 2024-02-26 NOTE — Addendum Note (Signed)
 Encounter addended by: Yossef Gilkison R, MD on: 02/26/2024 10:25 PM  Actions taken: Clinical Note Signed, Level of Service modified, Visit diagnoses modified, Charge Capture section accepted

## 2024-03-02 ENCOUNTER — Other Ambulatory Visit (HOSPITAL_COMMUNITY): Payer: Self-pay | Admitting: *Deleted

## 2024-03-02 DIAGNOSIS — Z95811 Presence of heart assist device: Secondary | ICD-10-CM

## 2024-03-02 DIAGNOSIS — Z7901 Long term (current) use of anticoagulants: Secondary | ICD-10-CM

## 2024-03-05 ENCOUNTER — Ambulatory Visit (HOSPITAL_COMMUNITY): Payer: Self-pay | Admitting: Pharmacist

## 2024-03-05 ENCOUNTER — Ambulatory Visit (HOSPITAL_COMMUNITY)
Admission: RE | Admit: 2024-03-05 | Discharge: 2024-03-05 | Disposition: A | Discharge status: Home / Self Care | Source: Ambulatory Visit | Attending: Cardiology | Admitting: Cardiology

## 2024-03-05 DIAGNOSIS — Z4801 Encounter for change or removal of surgical wound dressing: Secondary | ICD-10-CM | POA: Diagnosis present

## 2024-03-05 DIAGNOSIS — Z7901 Long term (current) use of anticoagulants: Secondary | ICD-10-CM | POA: Diagnosis not present

## 2024-03-05 DIAGNOSIS — Z95811 Presence of heart assist device: Secondary | ICD-10-CM | POA: Diagnosis not present

## 2024-03-05 LAB — PROTIME-INR
INR: 2.2 — ABNORMAL HIGH (ref 0.8–1.2)
Prothrombin Time: 25.2 s — ABNORMAL HIGH (ref 11.4–15.2)

## 2024-03-05 NOTE — Progress Notes (Addendum)
 Pt presents for dressing change/INR in VAD clinic today alone. Reports no issues with VAD equipment or drive line.   Exit Site Care: Existing VAD dressing removed and site care performed using sterile technique. Drive line exit site cleaned with Chlora prep applicators x 2, allowed to dry, and Sorbaview dressing with Silverlon patch applied. Exit site healed and incorporated, the velour is fully implanted at exit site. Site does not tunnel and is incorporated. No redness, tenderness, drainage, foul odor or rash noted. Drive line anchor re-applied x 2. 2 large tegaderms applied.     Patient Instructions:  Return to clinic next Wednesday for dressing change.  Schuyler Lunger RN, BSN VAD Coordinator 24/7 Pager [phone]

## 2024-03-07 ENCOUNTER — Other Ambulatory Visit (HOSPITAL_COMMUNITY): Payer: Self-pay | Admitting: Internal Medicine

## 2024-03-07 DIAGNOSIS — Z95811 Presence of heart assist device: Secondary | ICD-10-CM

## 2024-03-07 DIAGNOSIS — N529 Male erectile dysfunction, unspecified: Secondary | ICD-10-CM

## 2024-03-07 DIAGNOSIS — T827XXA Infection and inflammatory reaction due to other cardiac and vascular devices, implants and grafts, initial encounter: Secondary | ICD-10-CM

## 2024-03-07 DIAGNOSIS — I5042 Chronic combined systolic (congestive) and diastolic (congestive) heart failure: Secondary | ICD-10-CM

## 2024-03-07 DIAGNOSIS — I50812 Chronic right heart failure: Secondary | ICD-10-CM

## 2024-03-07 DIAGNOSIS — Z7901 Long term (current) use of anticoagulants: Secondary | ICD-10-CM

## 2024-03-08 MED ORDER — SILDENAFIL CITRATE 20 MG PO TABS: 20.0000 mg | ORAL_TABLET | Freq: Three times a day (TID) | ORAL | 11 refills | Status: DC

## 2024-03-08 MED ORDER — WARFARIN SODIUM 5 MG PO TABS: ORAL_TABLET | ORAL | 5 refills | Status: AC

## 2024-03-09 ENCOUNTER — Other Ambulatory Visit (HOSPITAL_COMMUNITY): Payer: Self-pay | Admitting: *Deleted

## 2024-03-09 DIAGNOSIS — Z7901 Long term (current) use of anticoagulants: Secondary | ICD-10-CM

## 2024-03-09 DIAGNOSIS — Z95811 Presence of heart assist device: Secondary | ICD-10-CM

## 2024-03-14 ENCOUNTER — Ambulatory Visit (HOSPITAL_COMMUNITY)
Admission: RE | Admit: 2024-03-14 | Discharge: 2024-03-14 | Disposition: A | Discharge status: Home / Self Care | Source: Ambulatory Visit | Attending: Cardiology | Admitting: Cardiology

## 2024-03-14 ENCOUNTER — Ambulatory Visit (HOSPITAL_COMMUNITY): Payer: Self-pay | Admitting: Pharmacist

## 2024-03-14 DIAGNOSIS — Z7901 Long term (current) use of anticoagulants: Secondary | ICD-10-CM | POA: Insufficient documentation

## 2024-03-14 DIAGNOSIS — Z95811 Presence of heart assist device: Secondary | ICD-10-CM | POA: Insufficient documentation

## 2024-03-14 LAB — PROTIME-INR
INR: 3.2 — ABNORMAL HIGH (ref 0.8–1.2)
Prothrombin Time: 34.4 s — ABNORMAL HIGH (ref 11.4–15.2)

## 2024-03-14 NOTE — Addendum Note (Signed)
 Encounter addended by: Cathern Andriette DEL, LCSW on: 03/14/2024 3:11 PM  Actions taken: Clinical Note Signed

## 2024-03-14 NOTE — Addendum Note (Signed)
 Encounter addended by: Cathern Andriette DEL, LCSW on: 03/14/2024 10:48 AM  Actions taken: Flowsheet accepted, Clinical Note Signed

## 2024-03-14 NOTE — Progress Notes (Signed)
 Pt presents for dressing change/INR in VAD clinic today alone. Reports no issues with VAD equipment or drive line.   Pts gas is about to be turned off due to non payment. Jenna into see the patient.  Exit Site Care: Existing VAD dressing removed and site care performed using sterile technique. Drive line exit site cleaned with Chlora prep applicators x 2, allowed to dry, and Sorbaview dressing with Silverlon patch applied. Exit site healed and incorporated, the velour is fully implanted at exit site. Site does not tunnel and is incorporated. No redness, tenderness, drainage, foul odor or rash noted. Drive line anchor re-applied x 2. 2 large tegaderms applied.     Patient Instructions:  Return to clinic next Friday for dressing change.  Lauraine Ip RN, BSN VAD Coordinator 24/7 Pager [phone]

## 2024-03-14 NOTE — Progress Notes (Addendum)
 H&V Care Navigation CSW Progress Note  Clinical Social Worker consulted due to financial concerns.  Patient reports that his gas got turned off in March due to inability to pay and now owes about $1000 before they will turn it back on- needs this for heat now that its getting colder.  Patient unable to pay to get gas back on and pay for rent.  CSW discussed patient care fund assistance to help with rent- patient agreeable.  CSW spoke with landlord Krystal Epley [phone] who is agreeable to working with us  on patient assistance.  Check request submitted for $825 rent.  SDOH Screenings   Food Insecurity: No Food Insecurity (01/14/2023)  Housing: Low Risk  (01/14/2023)  Transportation Needs: No Transportation Needs (01/14/2023)  Utilities: At Risk (03/14/2024)  Alcohol Screen: Low Risk  (01/14/2023)  Depression (PHQ2-9): Medium Risk (09/25/2019)  Financial Resource Strain: Medium Risk (03/14/2024)  Physical Activity: Sufficiently Active (01/14/2023)  Stress: No Stress Concern Present (01/14/2023)  Tobacco Use: Medium Risk (07/21/2023)   Will continue to follow and assist as needed  Jonathan Wood H. Elyanna Wallick, LCSW Clinical Social Worker [facility] Desk#: [phone] Cell#: [phone]

## 2024-03-16 ENCOUNTER — Other Ambulatory Visit (HOSPITAL_COMMUNITY): Payer: Self-pay | Admitting: *Deleted

## 2024-03-16 DIAGNOSIS — Z7901 Long term (current) use of anticoagulants: Secondary | ICD-10-CM

## 2024-03-16 DIAGNOSIS — Z95811 Presence of heart assist device: Secondary | ICD-10-CM

## 2024-03-23 ENCOUNTER — Ambulatory Visit (HOSPITAL_COMMUNITY)
Admission: RE | Admit: 2024-03-23 | Discharge: 2024-03-23 | Disposition: A | Discharge status: Home / Self Care | Source: Ambulatory Visit | Attending: Cardiology | Admitting: Cardiology

## 2024-03-23 ENCOUNTER — Ambulatory Visit (HOSPITAL_COMMUNITY): Payer: Self-pay | Admitting: Pharmacist

## 2024-03-23 DIAGNOSIS — Z7901 Long term (current) use of anticoagulants: Secondary | ICD-10-CM | POA: Diagnosis not present

## 2024-03-23 DIAGNOSIS — Z59869 Financial insecurity, unspecified: Secondary | ICD-10-CM | POA: Insufficient documentation

## 2024-03-23 DIAGNOSIS — Z4801 Encounter for change or removal of surgical wound dressing: Secondary | ICD-10-CM | POA: Diagnosis present

## 2024-03-23 DIAGNOSIS — Z95811 Presence of heart assist device: Secondary | ICD-10-CM | POA: Diagnosis not present

## 2024-03-23 LAB — PROTIME-INR
INR: 2.1 — ABNORMAL HIGH (ref 0.8–1.2)
Prothrombin Time: 24.2 s — ABNORMAL HIGH (ref 11.4–15.2)

## 2024-03-23 NOTE — Progress Notes (Signed)
 Pt presents for dressing change/INR in VAD clinic today alone. Reports no issues with VAD equipment or drive line.   Exit Site Care: Existing VAD dressing removed and site care performed using sterile technique. Drive line exit site cleaned with Chlora prep applicators x 2, allowed to dry, and Sorbaview dressing with Silverlon patch applied. Exit site healed and incorporated, the velour is fully implanted at exit site. Site does not tunnel and is incorporated. No redness, tenderness, drainage, foul odor or rash noted. Drive line anchor re-applied x 2. 2 large tegaderms applied.     Patient Instructions:  Return to clinic next Monday for dressing change/INR  Lauraine Ip RN, BSN VAD Coordinator 24/7 Pager [phone]

## 2024-03-23 NOTE — Addendum Note (Signed)
 Encounter addended by: Elza Lauraine NOVAK, RN on: 03/23/2024 10:46 AM  Actions taken: Clinical Note Signed, Charge Capture section accepted

## 2024-03-23 NOTE — Progress Notes (Signed)
 H&V Care Navigation CSW Progress Note  Clinical Social Worker provided check for pt rent to him- he will give to landlord today.  Will continue to follow and assist as needed  Andriette HILARIO Leech, LCSW Clinical Social Worker [facility] Desk#: [phone] Cell#: [phone]

## 2024-03-23 NOTE — Addendum Note (Signed)
 Encounter addended by: Cathern Andriette DEL, LCSW on: 03/23/2024 10:15 AM  Actions taken: Clinical Note Signed

## 2024-03-30 ENCOUNTER — Other Ambulatory Visit (HOSPITAL_COMMUNITY): Payer: Self-pay | Admitting: *Deleted

## 2024-03-30 DIAGNOSIS — Z95811 Presence of heart assist device: Secondary | ICD-10-CM

## 2024-03-30 DIAGNOSIS — Z7901 Long term (current) use of anticoagulants: Secondary | ICD-10-CM

## 2024-04-03 ENCOUNTER — Ambulatory Visit (HOSPITAL_COMMUNITY)

## 2024-04-04 ENCOUNTER — Ambulatory Visit (HOSPITAL_COMMUNITY)
Admission: RE | Admit: 2024-04-04 | Discharge: 2024-04-04 | Disposition: A | Discharge status: Home / Self Care | Source: Ambulatory Visit | Attending: Cardiology | Admitting: Cardiology

## 2024-04-04 ENCOUNTER — Ambulatory Visit (HOSPITAL_COMMUNITY): Payer: Self-pay | Admitting: Pharmacist

## 2024-04-04 DIAGNOSIS — Z95811 Presence of heart assist device: Secondary | ICD-10-CM | POA: Diagnosis present

## 2024-04-04 DIAGNOSIS — Z7901 Long term (current) use of anticoagulants: Secondary | ICD-10-CM | POA: Diagnosis not present

## 2024-04-04 LAB — PROTIME-INR
INR: 2.3 — ABNORMAL HIGH (ref 0.8–1.2)
Prothrombin Time: 26.7 s — ABNORMAL HIGH (ref 11.4–15.2)

## 2024-04-04 NOTE — Progress Notes (Signed)
 Pt presents for dressing change/INR in VAD clinic today alone. Reports no issues with VAD equipment or drive line.   Exit Site Care: Existing VAD dressing removed and site care performed using sterile technique. Drive line exit site cleaned with Chlora prep applicators x 2, allowed to dry, and Sorbaview dressing with Silverlon patch applied. Exit site healed and incorporated, the velour is fully implanted at exit site. Site does not tunnel and is incorporated. No redness, tenderness, drainage, foul odor or rash noted. Drive line anchor re-applied x 2. 2 large tegaderms applied.    Pt has small actively bleeding skin care that was cleansed with VASHE and covered with a bandaid. A small amount of his dressing is exposed for wound care. Pt instructed to cover with tegaderm prior to showering.  Patient Instructions:  Return to clinic next Wednesday for dressing change/INR  Schuyler Lunger RN, BSN VAD Coordinator 24/7 Pager [phone]

## 2024-04-06 ENCOUNTER — Other Ambulatory Visit (HOSPITAL_COMMUNITY): Payer: Self-pay

## 2024-04-06 DIAGNOSIS — Z95811 Presence of heart assist device: Secondary | ICD-10-CM

## 2024-04-06 DIAGNOSIS — Z7901 Long term (current) use of anticoagulants: Secondary | ICD-10-CM

## 2024-04-11 ENCOUNTER — Ambulatory Visit (HOSPITAL_COMMUNITY)

## 2024-04-16 ENCOUNTER — Ambulatory Visit (HOSPITAL_COMMUNITY)
Admission: RE | Admit: 2024-04-16 | Discharge: 2024-04-16 | Disposition: A | Source: Ambulatory Visit | Attending: Cardiology

## 2024-04-16 ENCOUNTER — Ambulatory Visit (HOSPITAL_COMMUNITY): Payer: Self-pay | Admitting: Pharmacist

## 2024-04-16 DIAGNOSIS — Z4509 Encounter for adjustment and management of other cardiac device: Secondary | ICD-10-CM | POA: Diagnosis present

## 2024-04-16 DIAGNOSIS — Z95811 Presence of heart assist device: Secondary | ICD-10-CM | POA: Diagnosis not present

## 2024-04-16 DIAGNOSIS — Z7901 Long term (current) use of anticoagulants: Secondary | ICD-10-CM | POA: Insufficient documentation

## 2024-04-16 LAB — PROTIME-INR
INR: 2.2 — ABNORMAL HIGH (ref 0.8–1.2)
Prothrombin Time: 25.6 s — ABNORMAL HIGH (ref 11.4–15.2)

## 2024-04-16 NOTE — Progress Notes (Signed)
 Pt presents for dressing change/INR in VAD clinic today alone. Reports no issues with VAD equipment or drive line.   Exit Site Care: Existing VAD dressing removed and site care performed using sterile technique. Drive line exit site cleaned with Chlora prep applicators x 2, allowed to dry, and Sorbaview dressing with Silverlon patch applied. Exit site healed and incorporated, the velour is fully implanted at exit site. Site does not tunnel and is incorporated. No redness, tenderness, drainage, foul odor or rash noted. Drive line anchor re-applied x 2. 2 large tegaderms applied.    Pt has small actively bleeding skin care that was cleansed with VASHE and covered with a bandaid. A small amount of his dressing is exposed for wound care. Pt instructed to cover with tegaderm prior to showering.  Patient Instructions:  Return to clinic next Thursday for full visit with Dr. Cherrie Schuyler Lunger RN, BSN VAD Coordinator 24/7 Pager [phone]

## 2024-04-24 ENCOUNTER — Other Ambulatory Visit (HOSPITAL_COMMUNITY): Payer: Self-pay | Admitting: *Deleted

## 2024-04-24 DIAGNOSIS — Z7901 Long term (current) use of anticoagulants: Secondary | ICD-10-CM

## 2024-04-24 DIAGNOSIS — Z95811 Presence of heart assist device: Secondary | ICD-10-CM

## 2024-04-24 DIAGNOSIS — I50812 Chronic right heart failure: Secondary | ICD-10-CM

## 2024-04-26 ENCOUNTER — Ambulatory Visit (HOSPITAL_COMMUNITY)
Admission: RE | Admit: 2024-04-26 | Discharge: 2024-04-26 | Disposition: A | Discharge status: Home / Self Care | Source: Ambulatory Visit | Attending: Internal Medicine | Admitting: Internal Medicine

## 2024-04-26 ENCOUNTER — Ambulatory Visit (HOSPITAL_COMMUNITY): Payer: Self-pay | Admitting: Pharmacist

## 2024-04-26 VITALS — BP 118/0 | HR 75 | Wt 232.4 lb

## 2024-04-26 DIAGNOSIS — Z720 Tobacco use: Secondary | ICD-10-CM | POA: Diagnosis not present

## 2024-04-26 DIAGNOSIS — I5022 Chronic systolic (congestive) heart failure: Secondary | ICD-10-CM | POA: Diagnosis not present

## 2024-04-26 DIAGNOSIS — Z95811 Presence of heart assist device: Secondary | ICD-10-CM | POA: Diagnosis not present

## 2024-04-26 DIAGNOSIS — I50812 Chronic right heart failure: Secondary | ICD-10-CM | POA: Diagnosis not present

## 2024-04-26 DIAGNOSIS — Z7901 Long term (current) use of anticoagulants: Secondary | ICD-10-CM

## 2024-04-26 DIAGNOSIS — K279 Peptic ulcer, site unspecified, unspecified as acute or chronic, without hemorrhage or perforation: Secondary | ICD-10-CM | POA: Diagnosis not present

## 2024-04-26 DIAGNOSIS — Z79899 Other long term (current) drug therapy: Secondary | ICD-10-CM | POA: Diagnosis not present

## 2024-04-26 DIAGNOSIS — T827XXA Infection and inflammatory reaction due to other cardiac and vascular devices, implants and grafts, initial encounter: Secondary | ICD-10-CM | POA: Diagnosis not present

## 2024-04-26 DIAGNOSIS — I428 Other cardiomyopathies: Secondary | ICD-10-CM | POA: Diagnosis not present

## 2024-04-26 DIAGNOSIS — I10 Essential (primary) hypertension: Secondary | ICD-10-CM

## 2024-04-26 DIAGNOSIS — I110 Hypertensive heart disease with heart failure: Secondary | ICD-10-CM | POA: Diagnosis not present

## 2024-04-26 DIAGNOSIS — Z87891 Personal history of nicotine dependence: Secondary | ICD-10-CM | POA: Diagnosis not present

## 2024-04-26 LAB — PROTIME-INR
INR: 2.1 — ABNORMAL HIGH (ref 0.8–1.2)
Prothrombin Time: 24.5 s — ABNORMAL HIGH (ref 11.4–15.2)

## 2024-04-26 LAB — BASIC METABOLIC PANEL WITH GFR
Anion gap: 9 (ref 5–15)
BUN: 10 mg/dL (ref 6–20)
CO2: 23 mmol/L (ref 22–32)
Calcium: 8.8 mg/dL — ABNORMAL LOW (ref 8.9–10.3)
Chloride: 108 mmol/L (ref 98–111)
Creatinine, Ser: 1.01 mg/dL (ref 0.61–1.24)
GFR, Estimated: >60 mL/min (ref >60)
Glucose, Bld: 101 mg/dL — ABNORMAL HIGH (ref 70–99)
Potassium: 4.1 mmol/L (ref 3.5–5.1)
Sodium: 140 mmol/L (ref 135–145)

## 2024-04-26 LAB — CBC
HCT: 33.8 % — ABNORMAL LOW (ref 39.0–52.0)
Hemoglobin: 10.8 g/dL — ABNORMAL LOW (ref 13.0–17.0)
MCH: 28.3 pg (ref 26.0–34.0)
MCHC: 32 g/dL (ref 30.0–36.0)
MCV: 88.5 fL (ref 80.0–100.0)
Platelets: 205 K/uL (ref 150–400)
RBC: 3.82 MIL/uL — ABNORMAL LOW (ref 4.22–5.81)
RDW: 12.7 % (ref 11.5–15.5)
WBC: 6.8 K/uL (ref 4.0–10.5)
nRBC: 0 % (ref 0.0–0.2)

## 2024-04-26 LAB — LACTATE DEHYDROGENASE: LDH: 195 U/L (ref 105–235)

## 2024-04-26 NOTE — Addendum Note (Signed)
 Encounter addended by: Gladis Schuyler BROCKS, RN on: 04/26/2024 1:21 PM  Actions taken: Vitals modified, Clinical Note Signed, Charge Capture section accepted

## 2024-04-26 NOTE — Progress Notes (Signed)
 VAD Clinic Note    ID:  Jonathan, Wood 1970-09-03, MRN [id number]  Location: Home  Provider location: [place] [facility] Type of Visit: Established patient  PCP: [name]  Cardiologist:  Toribio Fuel, MD Primary HF: Dr Fuel   Chief Complaint: Heart Failure/LVAD  History of Present Illness:  Jonathan Wood is a 53 y.o. 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  GI issues: - 4/20 EGD with gastritis and nonbleeding duodenal ulcers, colonoscopy with 5 polyps removed. - 10/21 ETOH pancreatitis. Gallbladder ok. Resolved with medical management   Infection issues: - Admitted 12/20 for DL infection with overlying cellulitis and volume overload.  - Admitted 4/21 for DL infection with large subxiphoid abscess. Underwent several debridements and eventually a pec muscle flap. Wound cx + MSSA. Initially on ancef  and rifabutin  but expanded to cefipime then changed to IV ancef  and po rifabutin .  - Readmitted 6/21 with superficial abscess and tachypnea. Superficial abscess I&D'd and packed. CT scan done and showed possible deeper fluid collection. Reviewed with TCTS who felt it was muscle flap. Sent to IR for possible drainage but u/s confirmed it was the muscle flap and no drainable fluid collection. Wound cx negative. ID saw and recommended continuing Keflex .  - Admitted 8/24 for DL infection He was admitted  for debridement and observation after. Wound packed with VASHE wet/dry dressing. Completed 3 days of IV cefepime  while admitted. Discharged on po cipro  750 bid = continue Keflex  for previous MSSA  RV failure: - Admitted 8/21 with lactic acidosis due to cardiogenic shock. Started on 0.25 of milrinone  and NE. Ramp echo on 8/13 with severe RV dysfunction. Speed increased 5600-> 6300. Tolerated ok. Repeat ramp echo on 8/20 speed increased to 6500. Diuresed  over 40 pounds.   Here for routine f/u. Doing well. Denies orthopnea or PND. No fevers, chills or problems with driveline. No bleeding, melena or neuro symptoms. No VAD alarms. Taking all meds as prescribed. Has quit smoking     LVAD Documentation    04/26/2024  Device Info  LVAD Type: Heartmate III  Date of Implant: 09/06/2017  Therapy Type: Destination Therapy      04/26/2024  Vitals  Heart Rate: 75 BPM  Automatic BP: 114/93  Doppler MAP: 118 mmHg  SpO2: 97 %    Last 3 Weights Weight Weight  02/09/2024 103.964 kg 229 lb 3.2 oz  10/26/2023 102.059 kg 225 lb  10/17/2023 99.791 kg 220 lb       04/26/2024  LVAD Paramaters  Speed: 6500 RPM  Flow: 6 LPM  PI: 3  Power: 6 Watts  Hematocrit: 32 %  Alarms: few low voltage advisories; No external power 12/10 (accidental)  Events: 5 - 20  Last Speed Change Date: 01/04/2020  Last Ramp Echo Date: 01/04/2020  Last Right Heart Cath Date: 08/30/2017  Bleeding History: No  Type of Dressing: Weekly  Annual Maintenance Date: 10/03/2023    Labs    Units 04/26/24 0939 04/16/24 1025 04/04/24 0932 02/16/24 1019 02/09/24 0933 01/09/24 0928 12/19/23 1002 12/05/23 0955  INR  2.1* 2.2* 2.3*   < > 2.6*   < > 2.6* 2.2*  LDH U/L 195  --   --   --  176  --  189 429*  HGB g/dL 89.1*  --   --   --  89.2*  --   --  11.6*  CREATININE mg/dL 8.98  --   --   --  8.92  --   --  0.95   < > = values in this interval not displayed.        Past Medical History:  Diagnosis Date   Asthma    CHF (congestive heart failure) (HCC)    a. 09/2016: EF 20-25% with cath showing normal cors   GERD (gastroesophageal reflux disease)    History of hiatal hernia    LVAD (left ventricular assist device) present (HCC)    OSA on CPAP 09/06/2018   Severe OSA with AHI 68/hr on CPAP at 12cm H2O   Past Surgical History:  Procedure Laterality Date   APPLICATION OF A-CELL OF CHEST/ABDOMEN N/A 08/24/2019   Procedure: Application Of A-Cell Of Chest/Abdomen;  Surgeon:  Fleeta Hanford Coy, MD;  Location: [facility] OR;  Service: Thoracic;  Laterality: N/A;   APPLICATION OF A-CELL OF CHEST/ABDOMEN N/A 09/05/2019   Procedure: Application Of A-Cell Of Chest/Abdomen;  Surgeon: Lowery Estefana RAMAN, DO;  Location: [facility] OR;  Service: Plastics;  Laterality: N/A;   APPLICATION OF A-CELL OF CHEST/ABDOMEN  09/03/2019   Procedure: Application Of A-Cell Of Chest/Abdomen;  Surgeon: Fleeta Hanford Coy, MD;  Location[facility] OR;  Service: Thoracic;;   APPLICATION OF WOUND VAC N/A 08/22/2019   Procedure: Debridement, Irrigation and Packing of Abdominal Incision.;  Surgeon: Fleeta Hanford Coy, MD;  Location: [facility] OR;  Service: Thoracic;  Laterality: N/A;   APPLICATION OF WOUND VAC N/A 08/24/2019   Procedure: Irrigation and Debridement with WOUND VAC APPLICATION;  Surgeon: Fleeta Hanford Coy, MD;  Location: [facility] OR;  Service: Thoracic;  Laterality: N/A;   APPLICATION OF WOUND VAC N/A 08/30/2019   Procedure: APPLICATION OF WOUND VAC;  Surgeon: Fleeta Hanford Coy, MD;  Location: [facility] OR;  Service: Thoracic;  Laterality: N/A;   APPLICATION OF WOUND VAC N/A 09/03/2019   Procedure: WOUND VAC CHANGE;  Surgeon: Fleeta Hanford Coy, MD;  Location: [facility] OR;  Service: Thoracic;  Laterality: N/A;   BIOPSY  08/23/2018   Procedure: BIOPSY;  Surgeon: Wilhelmenia Aloha Raddle., MD;  Location: [facility] ENDOSCOPY;  Service: Gastroenterology;;   COLONOSCOPY N/A 08/23/2018   Procedure: COLONOSCOPY;  Surgeon: Wilhelmenia Aloha Raddle., MD;  Location: [facility] ENDOSCOPY;  Service: Gastroenterology;  Laterality: N/A;   ENTEROSCOPY N/A 08/23/2018   Procedure: ENTEROSCOPY;  Surgeon: Wilhelmenia Aloha Raddle., MD;  Location: [facility] ENDOSCOPY;  Service: Gastroenterology;  Laterality: N/A;   HEMOSTASIS CLIP PLACEMENT  08/23/2018   Procedure: HEMOSTASIS CLIP PLACEMENT;  Surgeon: Wilhelmenia Aloha Raddle., MD;  Location: [facility] ENDOSCOPY;  Service: Gastroenterology;;   IABP INSERTION N/A 09/05/2017   Procedure: IABP INSERTION;  Surgeon: Cherrie Toribio SAUNDERS, MD;  Location: [facility] INVASIVE  CV LAB;  Service: Cardiovascular;  Laterality: N/A;   INSERTION OF IMPLANTABLE LEFT VENTRICULAR ASSIST DEVICE N/A 09/06/2017   Procedure: INSERTION OF IMPLANTABLE LEFT VENTRICULAR ASSIST DEVICE - HM3;  Surgeon: Fleeta Hanford Coy, MD;  Location: [facility] OR;  Service: Open Heart Surgery;  Laterality: N/A;  HM3   MUSCLE FLAP CLOSURE N/A 09/05/2019   Procedure: MUSCLE FLAP CLOSURE;  Surgeon: Lowery Estefana RAMAN, DO;  Location: [facility] OR;  Service: Plastics;  Laterality: N/A;   NASAL FRACTURE SURGERY  1987   POLYPECTOMY  08/23/2018   Procedure: POLYPECTOMY;  Surgeon: Mansouraty, Aloha Raddle., MD;  Location: [facility] ENDOSCOPY;  Service: Gastroenterology;;   RIGHT HEART CATH N/A 08/30/2017   Procedure: RIGHT HEART CATH;  Surgeon: Cherrie Toribio SAUNDERS, MD;  Location: [facility] INVASIVE CV LAB;  Service: Cardiovascular;  Laterality: N/A;   RIGHT/LEFT HEART CATH AND CORONARY ANGIOGRAPHY N/A 10/08/2016   Procedure: Right/Left Heart Cath and Coronary Angiography;  Surgeon: Claudene Pacific, MD;  Location: [facility] INVASIVE CV LAB;  Service: Cardiovascular;  Laterality: N/A;   STERNAL WOUND DEBRIDEMENT N/A 08/20/2019   Procedure: DEBRIDEMENT OF LVAD DRIVELINE TUNNEL;  Surgeon: Fleeta Hanford Coy, MD;  Location: [facility] OR;  Service: Thoracic;  Laterality: N/A;   STERNAL WOUND DEBRIDEMENT N/A 09/05/2019   Procedure: DEBRIDEMENT AND CLOSURE OF ABDOMINAL WOUND;  Surgeon: Lowery Estefana RAMAN, DO;  Location: [facility] OR;  Service: Plastics;  Laterality: N/A;   STERNAL WOUND DEBRIDEMENT N/A 01/13/2023   Procedure: VAD TUNNEL WOUND DEBRIDEMENT;  Surgeon: Obadiah Coy, MD;  Location: [facility] OR;  Service: Thoracic;  Laterality: N/A;   SUBMUCOSAL TATTOO INJECTION  08/23/2018   Procedure: SUBMUCOSAL TATTOO INJECTION;  Surgeon: Wilhelmenia Aloha Raddle., MD;  Location: [facility] ENDOSCOPY;  Service: Gastroenterology;;   TEE WITHOUT CARDIOVERSION N/A 09/06/2017   Procedure: TRANSESOPHAGEAL ECHOCARDIOGRAM (TEE);  Surgeon: Fleeta Hanford, Coy, MD;  Location: [facility] OR;  Service: Open Heart  Surgery;  Laterality: N/A;   WOUND DEBRIDEMENT N/A 08/30/2019   Procedure: Debridement Abdominal Wound;  Surgeon: Fleeta Hanford Coy, MD;  Location: [facility] OR;  Service: Thoracic;  Laterality: N/A;     Current Outpatient Medications  Medication Sig Dispense Refill   cephALEXin  (KEFLEX ) 500 MG capsule Take 1 capsule (500 mg total) by mouth 4 (four) times daily. 120 capsule 11   doxazosin  (CARDURA ) 2 MG tablet Take 1 tablet (2 mg total) by mouth at bedtime. 90 tablet 3   gabapentin  (NEURONTIN ) 300 MG capsule TAKE ONE CAPSULE BY MOUTH THREE TIMES DAILY AT 9am, 3pm, AND 9pm 90 capsule 5   Multiple Vitamins-Minerals (ONE-A-DAY MENS HEALTH FORMULA PO) Take by mouth.     ondansetron  (ZOFRAN ) 4 MG tablet Take 1 tablet (4 mg total) by mouth every 8 (eight) hours as needed for nausea or vomiting. 20 tablet 0   pantoprazole  (PROTONIX ) 40 MG tablet Take 1 tablet (40 mg total) by mouth 2 (two) times daily. 180 tablet 3   sacubitril -valsartan  (ENTRESTO ) 97-103 MG Take 1 tablet by mouth 2 (two) times daily. 60 tablet 6   sildenafil  (REVATIO ) 20 MG tablet Take 1 tablet (20 mg total) by mouth 3 (three) times daily. 90 tablet 11   sildenafil  (VIAGRA ) 100 MG tablet TAKE 1 TABLET BY MOUTH DAILY AS NEEDED FOR ERECTILE DYSFUNCTION 10 tablet 5   spironolactone  (ALDACTONE ) 25 MG tablet Take 0.5 tablets (12.5 mg total) by mouth daily. 45 tablet 6   warfarin (COUMADIN ) 5 MG tablet TAKE 1 AND 1/2 TABLETS BY MOUTH DAILY AT 4pm OR as directed by HF CLINIC 90 tablet 5   No current facility-administered medications for this encounter.    Allergies:   Patient has no known allergies.   Social History:  The patient  reports that he has quit smoking. His smoking use included cigarettes. He has a 12.5 pack-year smoking history. He has never used smokeless tobacco. He reports that he does not currently use alcohol. He reports that he does not currently use drugs after having used the following drugs: Marijuana.   Family History:   The patient's family history includes Alcohol abuse in his cousin; Hypertension in his father.   ROS:  Please see the history of present illness.   All other systems are personally reviewed and negative.     Exam: General:  NAD.  HEENT: normal  Neck: supple. JVP not  elevated.  Carotids 2+ bilat; no bruits. No lymphadenopathy or thryomegaly appreciated. Cor: LVAD hum.  Lungs: Clear. Abdomen:  soft, nontender, non-distended. No hepatosplenomegaly. No bruits or masses. Good bowel sounds. Driveline site clean. Anchor in place.  Extremities: no cyanosis, clubbing, rash. Warm no edema  Neuro: alert & oriented x 3. No focal deficits. Moves all 4 without problem   Recent Labs: 10/03/2023: Magnesium  1.7 02/09/2024: ALT 13 04/26/2024: BUN 10; Creatinine, Ser 1.01; Hemoglobin 10.8; Platelets 205; Potassium 4.1; Sodium 140    Wt Readings from Last 3 Encounters:  02/09/24 104 kg (229 lb 3.2 oz)  10/26/23 102.1 kg (225 lb)  10/17/23 99.8 kg (220 lb)    ASSESSMENT AND PLAN:  1. 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 Treated with milrinone  and then weaned off.  Diuresed 40 pounds. VAD speed increased - Ramp echo 7/25 LVEF 40-45% VAD placement stable Mod RV dysfunction. No AI. Speed not changed - Stable NYHA I  Volume ok  - Continue 97/103mg  BID entresto  and spironolactone  12.5mg  daily, had not yet taken BP meds this morning.  - Has quit smoking again. Resume cotinine checks to get him ready for transplant  2. HM3 LVAD 09/06/2017.  - VAD interrogated personally. Parameters stable. - -Ramp echo 7/25 LVEF 40-45% VAD placement stable Mod RV dysfunction. No AI. Speed not changed  - LDH 195 - INR 2.1 goal 2.0-3.0.  Discussed warfarin dosing with PharmD personally. - Off ASA with PUD.  - DL site ok Continue suppressive Keflex . Low threshold to add back cipro  if develops any drainage  3.   Essential HTN - MAPs up but hasn't taken meds yet today - Continue medications as above  4. Recurrent DL infection with subxiphoid abscess - s/p debridement and pec flap in 4/21 - resolved. Completed doxy/cipro  - Continue Keflex  as above - Management as above  5.  H/o GI bleed  - 08/2018 EGD showed gastritis and nonbleeding duodenal ulcers, colonoscopy with 5 polyps removed.  - Remains on PPI.No recent GI bleeding - Hgb 10.8 today (stable from 10.7)  6. Tobacco Abuse  - has quit again.  - congratulated him  I spent a total of 38 minutes today: 1) reviewing the patient's medical records including previous charts, labs and recent notes from other providers; 2) examining the patient and counseling them on their medical issues/explaining the plan of care; 3) adjusting meds as needed and 4) ordering lab work or other needed tests.    Signed, Toribio Fuel, MD  04/26/2024 12:49 PM  [facility] [address] [facility] [place] [address] [phone] (office) [phone] (fax)

## 2024-04-26 NOTE — Addendum Note (Signed)
 Encounter addended by: Gladis Schuyler BROCKS, RN on: 04/26/2024 1:34 PM  Actions taken: Clinical Note Signed

## 2024-04-26 NOTE — Patient Instructions (Signed)
 No medication changes Return in 10 days for dressing change/INR Return in 2 months for f/u with Dr. Bensimhon

## 2024-04-26 NOTE — Progress Notes (Addendum)
 Patient presents for 2 month f/u  in VAD clinic today alone. Denies any issues with VAD or equipment.   Patient reports he has been feeling good, no limitations with any activity.   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 Follow with ID. Dressing change performed as described below.   BP elevated at visit today - patient says he has not taken am medications. Dr. Bensimhon asked that he take them prior to his next dressing change and we will re-check BP at that time.  Pt did not bring back up equipment bag today. Asked him to bring to next visit so we can check his back up controller and charge battery. Pt verbalized understanding of same.   Vital Signs: Ht: 6'2 Doppler: 118 Automatic BP: 114/93 (101) HR: 75 SPO2: 97% RA   Weight: 232.4 lbs w/ equip Last wt: 229.4 lbs w/ equip   VAD Indication: Destination Therapy    VAD interrogation & Equipment Management: Speed: 6500 Flow: 6.1 Power: 6.0w    PI: 3 Hct: 32  Alarms: multiple low voltage advisories; NEP 12/10 (accidental) Events: 5 - 20 daily  Primary Controller:  Replace back up battery in 29 months Back up controller: Pt did not bring to clinic; asked to bring next visit  Annual Equipment Maintenance on UBC/PM was performed 10/03/23   I reviewed the LVAD parameters from today and compared the results to the patient's prior recorded data.  LVAD interrogation was NEGATIVE for significant power changes, NEGATIVE for clinical alarms and STABLE for PI events/speed drops. No programming changes were made and pump is functioning within specified parameters.   Exit Site Care: Existing VAD dressing removed and site care performed using sterile technique. Drive line exit site cleaned with Chlora prep applicators x 2, allowed to dry, and Sorbaview dressing with Silverlon patch applied. Exit site healed and incorporated, the velour is fully implanted at exit site. Site does not tunnel and is  incorporated. No redness, tenderness, drainage, foul odor or rash noted. Drive line anchor re-applied x 2. 2 large tegaderms applied. Pt may shower at home prior to weekly dressing change appointment. Pt given 4 weekly kits and 4 anchors at today's visit and asked to bring to each visit for exit site care.  Device: none  BP & Labs:  Doppler 118 - reflecting modified systolic BP   Hgb 10.8 - No S/S of bleeding. Specifically denies melena/BRBPR or nosebleeds.    LDH 195 - established baseline of 200-300. Denies tea-colored urine. No power elevations noted on interrogation.   Patient Instructions:  No change in medications. Please take your medications prior to your next full visit for accurate blood pressure measurement.  Please bring you VAD back up bag to next visit so we can check your back up controller and charge battery.  Return to VAD Clinic for INR and dressing change in 10 days - see below. Please call us  if any questions or concerns prior to your next visit.   Schuyler Lunger RN, BSN VAD Coordinator 24/7 Pager [phone]

## 2024-05-02 ENCOUNTER — Other Ambulatory Visit (HOSPITAL_COMMUNITY): Payer: Self-pay | Admitting: *Deleted

## 2024-05-02 DIAGNOSIS — Z95811 Presence of heart assist device: Secondary | ICD-10-CM

## 2024-05-02 DIAGNOSIS — Z7901 Long term (current) use of anticoagulants: Secondary | ICD-10-CM

## 2024-05-03 ENCOUNTER — Ambulatory Visit (HOSPITAL_COMMUNITY)
Admission: RE | Admit: 2024-05-03 | Discharge: 2024-05-03 | Disposition: A | Discharge status: Home / Self Care | Source: Ambulatory Visit | Attending: Internal Medicine | Admitting: Internal Medicine

## 2024-05-03 DIAGNOSIS — Z95811 Presence of heart assist device: Secondary | ICD-10-CM | POA: Insufficient documentation

## 2024-05-03 DIAGNOSIS — Z4509 Encounter for adjustment and management of other cardiac device: Secondary | ICD-10-CM | POA: Insufficient documentation

## 2024-05-03 NOTE — Progress Notes (Signed)
 Pt presents for dressing change in VAD clinic today alone. Reports no issues with VAD equipment or drive line.   Exit Site Care: Existing VAD dressing removed and site care performed using sterile technique. Drive line exit site cleaned with Chlora prep applicators x 2, allowed to dry, and Sorbaview dressing with Silverlon patch applied. Exit site healed and incorporated, the velour is fully implanted at exit site. Site does not tunnel and is incorporated. No redness, tenderness, drainage, foul odor or rash noted. Drive line anchor re-applied x 2. 2 large tegaderms applied.    Patient Instructions:  Return to clinic next in 10 days for dressing change  Schuyler Lunger RN, BSN VAD Coordinator 24/7 Pager [phone]

## 2024-05-07 ENCOUNTER — Ambulatory Visit (HOSPITAL_COMMUNITY)

## 2024-05-14 ENCOUNTER — Ambulatory Visit (HOSPITAL_COMMUNITY)

## 2024-05-15 ENCOUNTER — Ambulatory Visit (HOSPITAL_COMMUNITY): Payer: Self-pay | Admitting: Pharmacist

## 2024-05-15 ENCOUNTER — Ambulatory Visit (HOSPITAL_COMMUNITY)
Admission: RE | Admit: 2024-05-15 | Discharge: 2024-05-15 | Disposition: A | Discharge status: Home / Self Care | Source: Ambulatory Visit | Attending: Cardiology | Admitting: Cardiology

## 2024-05-15 DIAGNOSIS — Z4801 Encounter for change or removal of surgical wound dressing: Secondary | ICD-10-CM | POA: Insufficient documentation

## 2024-05-15 DIAGNOSIS — Z95811 Presence of heart assist device: Secondary | ICD-10-CM | POA: Diagnosis not present

## 2024-05-15 DIAGNOSIS — Z7901 Long term (current) use of anticoagulants: Secondary | ICD-10-CM | POA: Insufficient documentation

## 2024-05-15 LAB — PROTIME-INR
INR: 2.8 — ABNORMAL HIGH (ref 0.8–1.2)
Prothrombin Time: 30.5 s — ABNORMAL HIGH (ref 11.4–15.2)

## 2024-05-15 NOTE — Progress Notes (Signed)
 Pt presents for dressing change/INR in VAD clinic today alone. Reports no issues with VAD equipment or drive line.   Exit Site Care: Existing VAD dressing removed and site care performed using sterile technique. Drive line exit site cleaned with Chlora prep applicators x 2, allowed to dry, and Sorbaview dressing with Silverlon patch applied. Exit site healed and incorporated, the velour is fully implanted at exit site. Site does not tunnel and is incorporated. No redness, tenderness, drainage, foul odor or rash noted. Drive line anchor re-applied x 2. 2 large tegaderms applied.    Patient Instructions:  Return to clinic next in 10 days for dressing change  Schuyler Lunger RN, BSN VAD Coordinator 24/7 Pager [phone]

## 2024-05-18 ENCOUNTER — Other Ambulatory Visit (HOSPITAL_COMMUNITY): Payer: Self-pay

## 2024-05-18 DIAGNOSIS — Z95811 Presence of heart assist device: Secondary | ICD-10-CM

## 2024-05-18 DIAGNOSIS — Z7901 Long term (current) use of anticoagulants: Secondary | ICD-10-CM

## 2024-05-24 ENCOUNTER — Ambulatory Visit (HOSPITAL_COMMUNITY)

## 2024-05-25 ENCOUNTER — Ambulatory Visit (HOSPITAL_COMMUNITY)

## 2024-05-28 ENCOUNTER — Ambulatory Visit (HOSPITAL_COMMUNITY): Payer: Self-pay | Admitting: Pharmacist

## 2024-05-28 ENCOUNTER — Ambulatory Visit (HOSPITAL_COMMUNITY): Admission: RE | Admit: 2024-05-28 | Discharge: 2024-05-28 | Attending: Cardiology

## 2024-05-28 DIAGNOSIS — Z7901 Long term (current) use of anticoagulants: Secondary | ICD-10-CM | POA: Diagnosis not present

## 2024-05-28 DIAGNOSIS — Z95811 Presence of heart assist device: Secondary | ICD-10-CM | POA: Diagnosis present

## 2024-05-28 DIAGNOSIS — Z4801 Encounter for change or removal of surgical wound dressing: Secondary | ICD-10-CM | POA: Diagnosis not present

## 2024-05-28 LAB — PROTIME-INR
INR: 2.7 — ABNORMAL HIGH (ref 0.8–1.2)
Prothrombin Time: 29.6 s — ABNORMAL HIGH (ref 11.4–15.2)

## 2024-05-28 NOTE — Progress Notes (Signed)
 Pt presents for dressing change/INR in VAD clinic today alone. Reports no issues with VAD equipment or drive line.   Exit Site Care: Existing VAD dressing removed and site care performed using sterile technique. Drive line exit site cleaned with Chlora prep applicators x 2, allowed to dry, and Sorbaview dressing with Silverlon patch applied. Exit site healed and incorporated, the velour is fully implanted at exit site. Site does not tunnel and is incorporated. No redness, tenderness, drainage, foul odor or rash noted. Drive line anchor re-applied x 2. 2 large tegaderms applied.    Patient Instructions:  Return to clinic next in 10 days for dressing change  Schuyler Lunger RN, BSN VAD Coordinator 24/7 Pager [phone]

## 2024-06-01 ENCOUNTER — Other Ambulatory Visit (HOSPITAL_COMMUNITY): Payer: Self-pay

## 2024-06-01 DIAGNOSIS — Z7901 Long term (current) use of anticoagulants: Secondary | ICD-10-CM

## 2024-06-01 DIAGNOSIS — Z95811 Presence of heart assist device: Secondary | ICD-10-CM

## 2024-06-06 ENCOUNTER — Ambulatory Visit (HOSPITAL_COMMUNITY)

## 2024-06-08 ENCOUNTER — Ambulatory Visit (HOSPITAL_COMMUNITY): Payer: Self-pay | Admitting: Pharmacist

## 2024-06-08 ENCOUNTER — Other Ambulatory Visit (HOSPITAL_COMMUNITY): Payer: Self-pay | Admitting: *Deleted

## 2024-06-08 ENCOUNTER — Telehealth: Payer: Self-pay

## 2024-06-08 ENCOUNTER — Other Ambulatory Visit (HOSPITAL_COMMUNITY): Payer: Self-pay | Admitting: Internal Medicine

## 2024-06-08 ENCOUNTER — Ambulatory Visit (HOSPITAL_COMMUNITY)
Admission: RE | Admit: 2024-06-08 | Discharge: 2024-06-08 | Disposition: A | Source: Ambulatory Visit | Attending: Cardiology

## 2024-06-08 ENCOUNTER — Telehealth (HOSPITAL_COMMUNITY): Payer: Self-pay

## 2024-06-08 DIAGNOSIS — Z95811 Presence of heart assist device: Secondary | ICD-10-CM

## 2024-06-08 DIAGNOSIS — Z7901 Long term (current) use of anticoagulants: Secondary | ICD-10-CM | POA: Insufficient documentation

## 2024-06-08 DIAGNOSIS — Z4509 Encounter for adjustment and management of other cardiac device: Secondary | ICD-10-CM | POA: Insufficient documentation

## 2024-06-08 LAB — PROTIME-INR
INR: 2.4 — ABNORMAL HIGH (ref 0.8–1.2)
Prothrombin Time: 27.5 s — ABNORMAL HIGH (ref 11.4–15.2)

## 2024-06-08 MED ORDER — SACUBITRIL-VALSARTAN 97-103 MG PO TABS: 1.0000 | ORAL_TABLET | Freq: Two times a day (BID) | ORAL | 6 refills | Status: DC
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
## 2024-06-13 ENCOUNTER — Other Ambulatory Visit (HOSPITAL_COMMUNITY): Payer: Self-pay | Admitting: *Deleted

## 2024-06-13 ENCOUNTER — Telehealth (HOSPITAL_COMMUNITY): Payer: Self-pay

## 2024-06-13 ENCOUNTER — Other Ambulatory Visit (HOSPITAL_COMMUNITY): Payer: Self-pay

## 2024-06-13 DIAGNOSIS — I50812 Chronic right heart failure: Secondary | ICD-10-CM

## 2024-06-13 DIAGNOSIS — T827XXA Infection and inflammatory reaction due to other cardiac and vascular devices, implants and grafts, initial encounter: Secondary | ICD-10-CM

## 2024-06-13 DIAGNOSIS — I5042 Chronic combined systolic (congestive) and diastolic (congestive) heart failure: Secondary | ICD-10-CM

## 2024-06-13 DIAGNOSIS — Z95811 Presence of heart assist device: Secondary | ICD-10-CM

## 2024-06-13 DIAGNOSIS — Z7901 Long term (current) use of anticoagulants: Secondary | ICD-10-CM

## 2024-06-13 MED ORDER — SILDENAFIL CITRATE 20 MG PO TABS: 20.0000 mg | ORAL_TABLET | Freq: Three times a day (TID) | ORAL | 11 refills | Status: AC

## 2024-06-13 NOTE — Telephone Encounter (Signed)
 Advanced Heart Failure Patient Advocate Encounter  Prior authorization for Sildenafil  [facility]) has been submitted and approved. Test billing returns $1.60 for 90 day supply.  KeyBETHA MUNRO Effective: 06/13/2024 to 05/16/2025  Rachel DEL, CPhT Rx Patient Advocate Phone: [phone]

## 2024-06-15 ENCOUNTER — Other Ambulatory Visit (HOSPITAL_COMMUNITY): Payer: Self-pay | Admitting: Unknown Physician Specialty

## 2024-06-15 DIAGNOSIS — Z95811 Presence of heart assist device: Secondary | ICD-10-CM

## 2024-06-15 DIAGNOSIS — Z7901 Long term (current) use of anticoagulants: Secondary | ICD-10-CM

## 2024-06-20 ENCOUNTER — Ambulatory Visit (HOSPITAL_COMMUNITY): Payer: Self-pay

## 2024-06-20 ENCOUNTER — Ambulatory Visit (HOSPITAL_COMMUNITY): Admission: RE | Admit: 2024-06-20 | Discharge: 2024-06-20 | Attending: Cardiology

## 2024-06-20 DIAGNOSIS — Z4509 Encounter for adjustment and management of other cardiac device: Secondary | ICD-10-CM | POA: Insufficient documentation

## 2024-06-20 DIAGNOSIS — Z7901 Long term (current) use of anticoagulants: Secondary | ICD-10-CM | POA: Insufficient documentation

## 2024-06-20 DIAGNOSIS — Z95811 Presence of heart assist device: Secondary | ICD-10-CM | POA: Diagnosis not present

## 2024-06-20 LAB — PROTIME-INR
INR: 1.8 — ABNORMAL HIGH (ref 0.8–1.2)
Prothrombin Time: 22.3 s — ABNORMAL HIGH (ref 11.4–15.2)

## 2024-06-20 NOTE — Addendum Note (Signed)
 Encounter addended by: Gladis Schuyler BROCKS, RN on: 06/20/2024 10:28 AM  Actions taken: Clinical Note Signed

## 2024-06-20 NOTE — Progress Notes (Addendum)
 Pt presents for dressing change/INR in VAD clinic today alone. Reports no issues with VAD equipment or drive line.   Exit Site Care: Existing VAD dressing removed and site care performed using sterile technique. Drive line exit site cleaned with Chlora prep applicators x 2, allowed to dry, and Sorbaview dressing with Silverlon patch applied. Exit site healed and incorporated, the velour is fully implanted at exit site. Site does not tunnel and is incorporated. No redness, tenderness, drainage, foul odor or rash noted. Drive line anchor re-applied x 2. 2 large tegaderms applied.  Pt given 4 weekly kits and 1 box of tegaderms and asked to bring to future appointments for exit site care.   Patient Instructions:  Return to clinic next in 10 days for dressing change  Schuyler Lunger RN, BSN VAD Coordinator 24/7 Pager [phone]

## 2024-06-20 NOTE — Addendum Note (Signed)
 Encounter addended by: Gladis Schuyler BROCKS, RN on: 06/20/2024 9:36 AM  Actions taken: Clinical Note Signed, Charge Capture section accepted

## 2024-06-20 NOTE — Progress Notes (Signed)
 LVAD INR

## 2024-06-27 ENCOUNTER — Ambulatory Visit (HOSPITAL_COMMUNITY): Admitting: Internal Medicine

## 2024-07-03 ENCOUNTER — Ambulatory Visit (HOSPITAL_COMMUNITY): Admitting: Internal Medicine
